# Patient Record
Sex: Female | Born: 1955
Health system: Southern US, Community
[De-identification: ages and names within clinical notes are randomized; demographics above are authoritative.]

## PROBLEM LIST (undated history)

## (undated) DIAGNOSIS — I10 Essential (primary) hypertension: Secondary | ICD-10-CM

## (undated) DIAGNOSIS — IMO0001 Reserved for inherently not codable concepts without codable children: Secondary | ICD-10-CM

## (undated) DIAGNOSIS — IMO0002 Reserved for concepts with insufficient information to code with codable children: Secondary | ICD-10-CM

## (undated) DIAGNOSIS — I00 Rheumatic fever without heart involvement: Secondary | ICD-10-CM

## (undated) DIAGNOSIS — R509 Fever, unspecified: Secondary | ICD-10-CM

## (undated) DIAGNOSIS — C50919 Malignant neoplasm of unspecified site of unspecified female breast: Secondary | ICD-10-CM

## (undated) DIAGNOSIS — I82409 Acute embolism and thrombosis of unspecified deep veins of unspecified lower extremity: Secondary | ICD-10-CM

## (undated) HISTORY — PX: PORT A CATH REVISION: SHX6033

## (undated) HISTORY — DX: Acute embolism and thrombosis of unspecified deep veins of unspecified lower extremity: I82.409

## (undated) HISTORY — PX: MASTECTOMY: SHX3

## (undated) HISTORY — PX: BREAST SURGERY: SHX581

## (undated) HISTORY — DX: Malignant neoplasm of unspecified site of unspecified female breast: C50.919

## (undated) HISTORY — DX: Rheumatic fever without heart involvement: I00

## (undated) HISTORY — DX: Essential (primary) hypertension: I10

---

## 1998-03-06 ENCOUNTER — Other Ambulatory Visit: Admission: RE | Admit: 1998-03-06 | Discharge: 1998-03-06 | Payer: Self-pay | Admitting: Internal Medicine

## 1998-10-25 ENCOUNTER — Emergency Department (HOSPITAL_COMMUNITY): Admission: EM | Admit: 1998-10-25 | Discharge: 1998-10-25 | Payer: Self-pay | Admitting: Emergency Medicine

## 1998-10-25 ENCOUNTER — Encounter: Payer: Self-pay | Admitting: Emergency Medicine

## 1999-02-09 ENCOUNTER — Other Ambulatory Visit: Admission: RE | Admit: 1999-02-09 | Discharge: 1999-02-09 | Payer: Self-pay | Admitting: Internal Medicine

## 2000-03-17 ENCOUNTER — Ambulatory Visit (HOSPITAL_COMMUNITY): Admission: RE | Admit: 2000-03-17 | Discharge: 2000-03-17 | Payer: Self-pay | Admitting: *Deleted

## 2001-02-22 ENCOUNTER — Emergency Department (HOSPITAL_COMMUNITY): Admission: EM | Admit: 2001-02-22 | Discharge: 2001-02-23 | Payer: Self-pay | Admitting: Emergency Medicine

## 2001-05-14 ENCOUNTER — Other Ambulatory Visit: Admission: RE | Admit: 2001-05-14 | Discharge: 2001-05-14 | Payer: Self-pay | Admitting: Family Medicine

## 2002-03-04 ENCOUNTER — Other Ambulatory Visit: Admission: RE | Admit: 2002-03-04 | Discharge: 2002-03-04 | Payer: Self-pay | Admitting: Radiology

## 2002-03-04 ENCOUNTER — Encounter (INDEPENDENT_AMBULATORY_CARE_PROVIDER_SITE_OTHER): Payer: Self-pay | Admitting: Specialist

## 2002-03-04 ENCOUNTER — Encounter: Payer: Self-pay | Admitting: Internal Medicine

## 2002-03-04 ENCOUNTER — Encounter: Admission: RE | Admit: 2002-03-04 | Discharge: 2002-03-04 | Payer: Self-pay | Admitting: Internal Medicine

## 2002-03-04 ENCOUNTER — Encounter (INDEPENDENT_AMBULATORY_CARE_PROVIDER_SITE_OTHER): Payer: Self-pay | Admitting: *Deleted

## 2002-03-25 ENCOUNTER — Encounter: Admission: RE | Admit: 2002-03-25 | Discharge: 2002-03-25 | Payer: Self-pay | Admitting: *Deleted

## 2002-03-25 ENCOUNTER — Encounter: Payer: Self-pay | Admitting: *Deleted

## 2002-03-27 ENCOUNTER — Encounter: Payer: Self-pay | Admitting: *Deleted

## 2002-03-27 ENCOUNTER — Ambulatory Visit (HOSPITAL_COMMUNITY): Admission: RE | Admit: 2002-03-27 | Discharge: 2002-03-27 | Payer: Self-pay | Admitting: *Deleted

## 2002-04-02 ENCOUNTER — Encounter: Admission: RE | Admit: 2002-04-02 | Discharge: 2002-04-02 | Payer: Self-pay | Admitting: General Surgery

## 2002-04-02 ENCOUNTER — Encounter: Payer: Self-pay | Admitting: General Surgery

## 2002-04-03 ENCOUNTER — Encounter (INDEPENDENT_AMBULATORY_CARE_PROVIDER_SITE_OTHER): Payer: Self-pay | Admitting: *Deleted

## 2002-04-03 ENCOUNTER — Other Ambulatory Visit: Admission: RE | Admit: 2002-04-03 | Discharge: 2002-04-03 | Payer: Self-pay | Admitting: General Surgery

## 2002-04-03 ENCOUNTER — Ambulatory Visit (HOSPITAL_BASED_OUTPATIENT_CLINIC_OR_DEPARTMENT_OTHER): Admission: RE | Admit: 2002-04-03 | Discharge: 2002-04-03 | Payer: Self-pay | Admitting: General Surgery

## 2002-04-29 ENCOUNTER — Ambulatory Visit (HOSPITAL_COMMUNITY): Admission: RE | Admit: 2002-04-29 | Discharge: 2002-04-29 | Payer: Self-pay | Admitting: *Deleted

## 2002-04-29 ENCOUNTER — Encounter: Payer: Self-pay | Admitting: *Deleted

## 2002-05-02 ENCOUNTER — Ambulatory Visit: Admission: RE | Admit: 2002-05-02 | Discharge: 2002-07-31 | Payer: Self-pay | Admitting: *Deleted

## 2002-05-27 ENCOUNTER — Encounter (INDEPENDENT_AMBULATORY_CARE_PROVIDER_SITE_OTHER): Payer: Self-pay | Admitting: *Deleted

## 2002-05-27 ENCOUNTER — Ambulatory Visit (HOSPITAL_BASED_OUTPATIENT_CLINIC_OR_DEPARTMENT_OTHER): Admission: RE | Admit: 2002-05-27 | Discharge: 2002-05-27 | Payer: Self-pay | Admitting: General Surgery

## 2002-07-19 ENCOUNTER — Encounter: Payer: Self-pay | Admitting: Emergency Medicine

## 2002-07-19 ENCOUNTER — Emergency Department (HOSPITAL_COMMUNITY): Admission: EM | Admit: 2002-07-19 | Discharge: 2002-07-19 | Payer: Self-pay | Admitting: Emergency Medicine

## 2002-08-01 ENCOUNTER — Ambulatory Visit: Admission: RE | Admit: 2002-08-01 | Discharge: 2002-08-15 | Payer: Self-pay | Admitting: *Deleted

## 2002-08-08 IMAGING — CT CT CHEST W/ CM
1 of 2 series · 15 of 32 positions shown, 19 images · IV contrast (GASTRO. & OMNIPAQUE [ID])
Comparison: none

FINDINGS
CLINICAL DATA: BREAST CARCINOMA.  CON NONE.
TECHNIQUE
MULTIDETECTOR HELICAL CT SCANNING OF THE CHEST, ABDOMEN, AND PELVIS WAS PERFORMED FOLLOWING
ADMINISTRATION OF ORAL CONTRAST AND FOOHH OF IV OMNIPAQUE 300.  DELAYED IMAGING THROUGH THE
KIDNEYS.  NO IMMEDIATE ADVERSE REACTION.  NO COMPARISON.
CT CHEST W/CONTRAST
HEART AND GREAT VESSELS GROSSLY UNREMARKABLE.  THERE IS A 2CM SOFT TISSUE ROUNDED DENSITY WITHIN
THE MEDIAL RIGHT BREAST WHICH MAY CORRESPOND WITH THE PATIENT'S KNOWN CARCINOMA.  THERE ARE MILDLY
ENLARGED RIGHT AXILLARY LYMPH NODES, LARGEST ON IMAGE NUMBER 13 MEASURING 2.3 X 1CM AND NEOPLASTIC
INVOLVEMENT NOT EXCLUDED.  NO EVIDENCE OF ENLARGED LYMPH NODES IN THE LEFT AXILLARY, MEDIASTINAL,
OR HILAR REGIONS.  NO PLEURAL OR PERICARDIAL EFFUSION.  LUNGS ARE CLEAR.  NO EVIDENCE OF PULMONARY
NODULES OR MASSES.
IMPRESSION
2CM MEDIAL RIGHT BREAST LESION WITH A FEW MILDLY ENLARGED RIGHT AXILLARY LYMPH NODES.  NEOPLASTIC
INVOLVEMENT NOT EXCLUDED.
CT ABDOMEN W/CONTRAST
THERE IS A 5MM LOW DENSITY LESION WITHIN THE INFERIOR LIVER ON IMAGE 57, NON SPECIFIC/TOO SMALL TO
CHARACTERIZE, BUT PROBABLY SMALL CYST OR HEMANGIOMA.  REMAINDER OF THE LIVER UNREMARKABLE.  SPLEEN,
PANCREAS, ADRENAL GLANDS, KIDNEYS, UNREMARKABLE.  NO FREE FLUID OR ENLARGED LYMPH NODES.  BOWEL
GROSSLY UNREMARKABLE.
NON SPECIFIC 5MM LOW DENSITY LESION IN THE LIVER ON IMAGE #57, OTHERWISE UNREMARKABLE CT ABDOMEN.
CT PELVIS W/CONTRAST
THERE IS A SUGGESTION OF TWO AREAS OF LOW ATTENUATION WITHIN THE UTERUS WHICH COULD REPRESENT A
SEPTATE UTERUS.  9.GPQ CYST/FOLLICLE WITHIN THE LEFT OVARY NOTED AS WELL AS A POSSIBLE 2.3 X
RIGHT ADNEXAL CYST VS. SMALL AMOUNT OF FREE FLUID.  NO DEFINITE SOLID MASSES.  NO ENLARGED LYMPH
NODES.  BLADDER GROSSLY UNREMARKABLE.
1.  BILATERAL ADNEXAL CYSTS/FOLLICLES.  ULTRASOUND MAY BE HELPFUL FOR FURTHER EVALUATION.
2.  SUGGESTION OF TWO SEPARATE LOW ATTENUATION ENDOMETRIAL CANALS WITHIN THE UTERUS, QUERY SEPTATE
UTERUS.  FURTHER IMAGING MAY BE HELPFUL AS CLINICALLY INDICATED (MRI).
3.  NO EVIDENCE OF METASTATIC DISEASE IN THE PELVIS.

[Series 2: chest/abd/pelvis · axial · 0.74mm/px · z∈[-498,-17]mm · 15 of 137 slices shown, 19 images]
[im 7/137  soft-tissue]
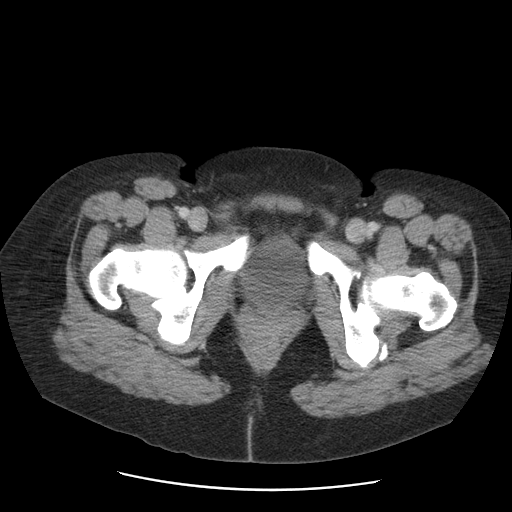
[im 7/137  bone]
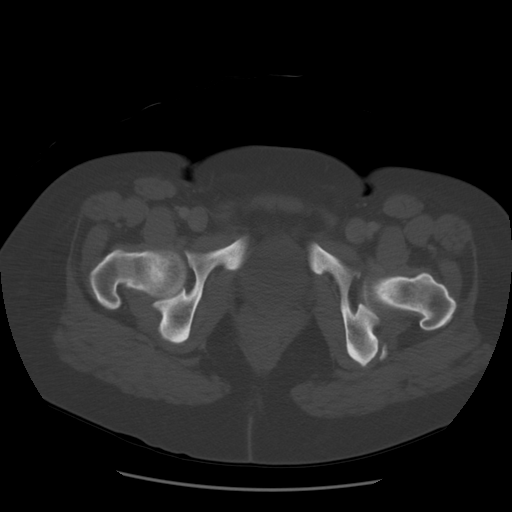
[im 19/137  soft-tissue]
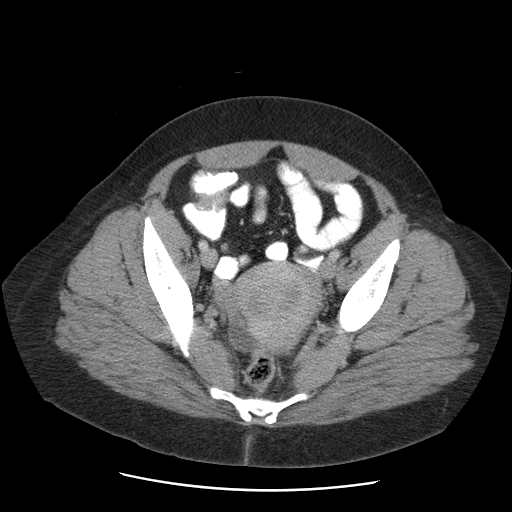
[im 31/137  soft-tissue]
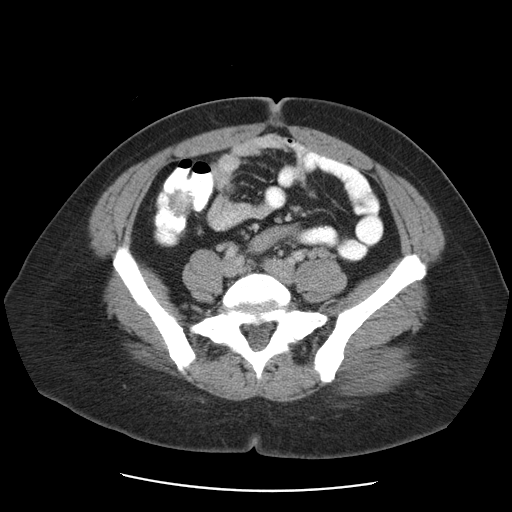
[im 38/137  soft-tissue]
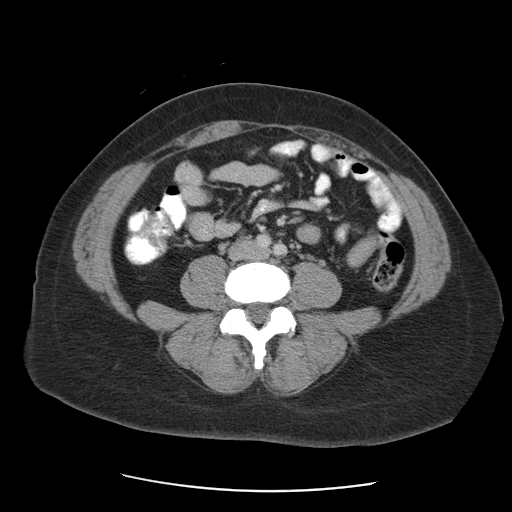
[im 50/137  soft-tissue]
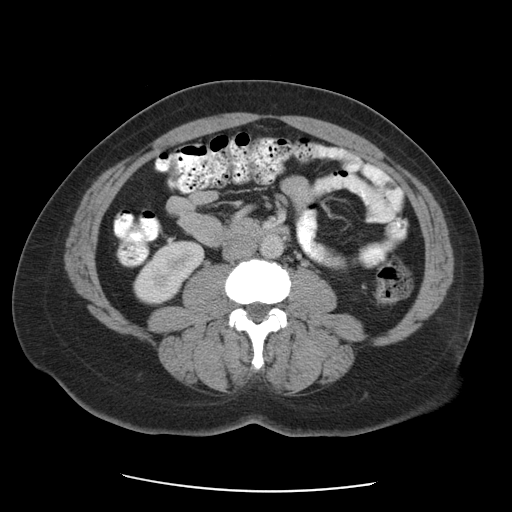
[im 56/137  soft-tissue]
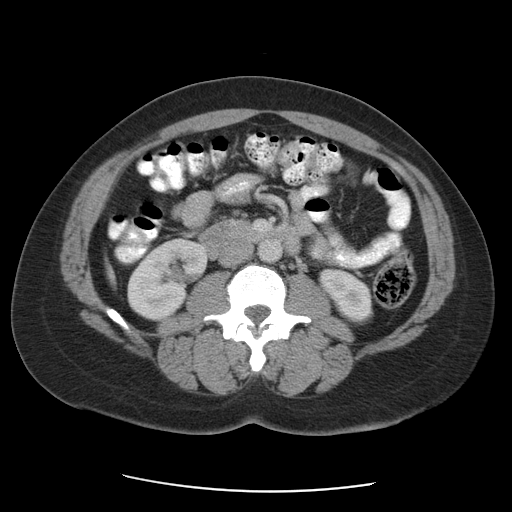
[im 69/137  soft-tissue]
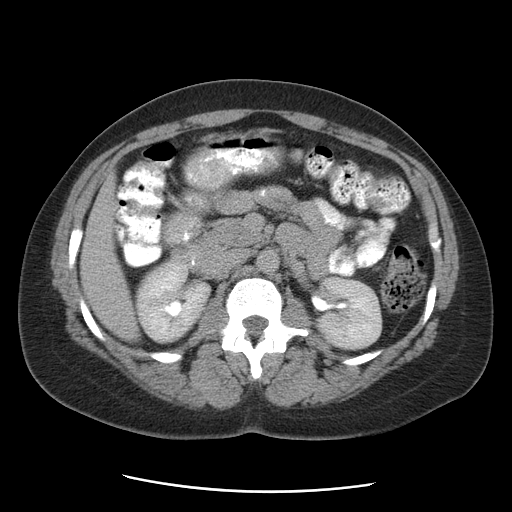
[im 81/137  soft-tissue]
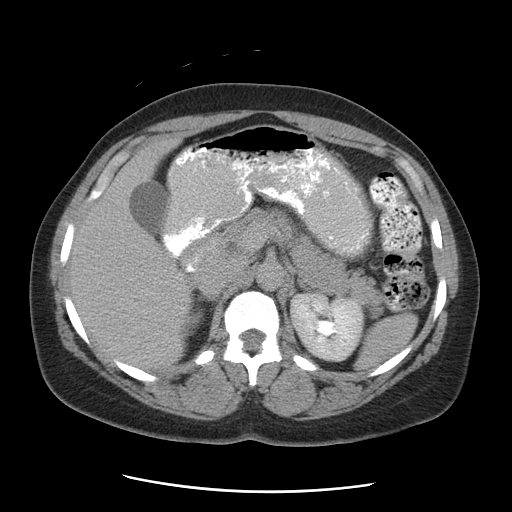
[im 87/137  soft-tissue]
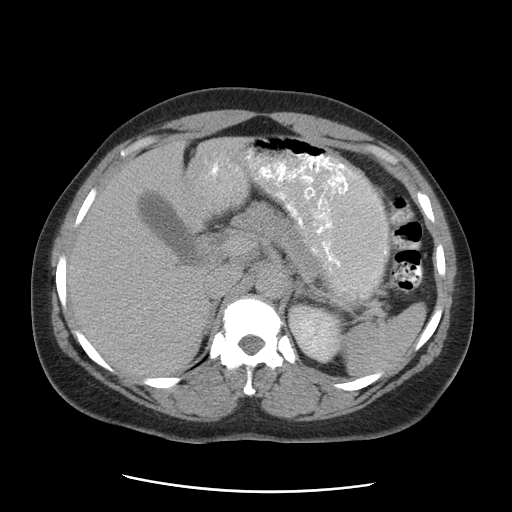
[im 87/137  bone]
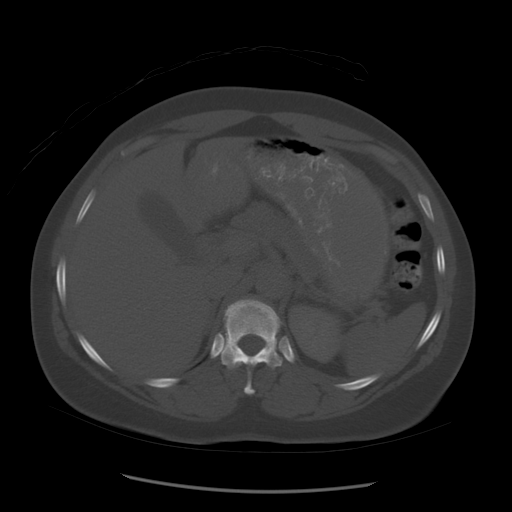
[im 99/137  soft-tissue]
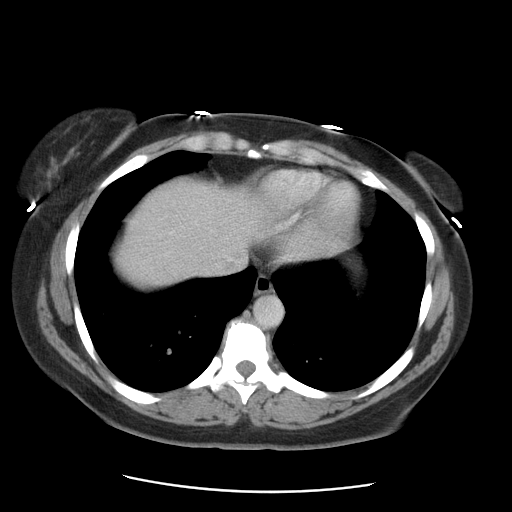
[im 106/137  soft-tissue]
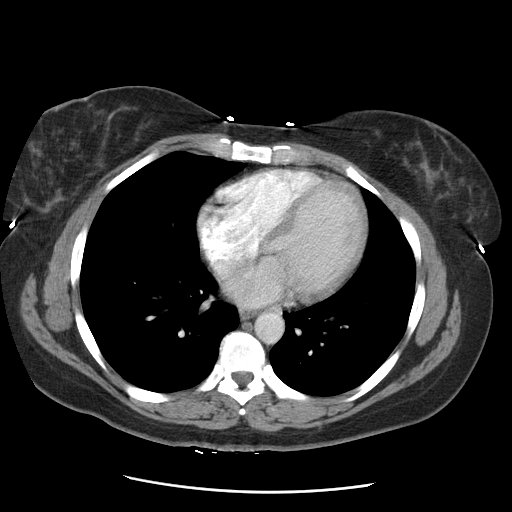
[im 112/137  lung]
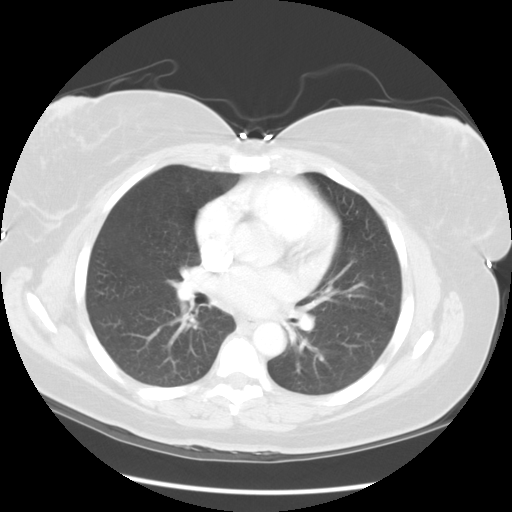
[im 118/137  soft-tissue]
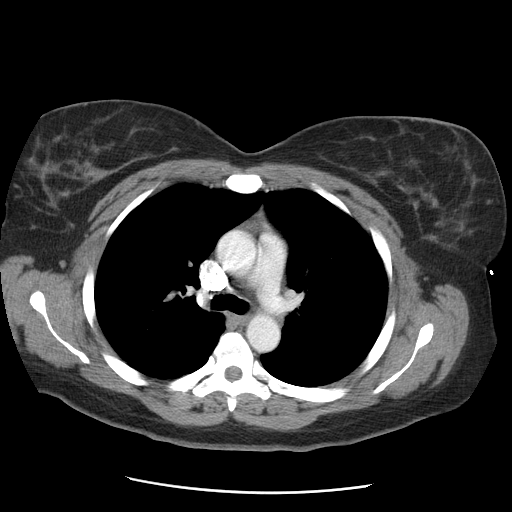
[im 118/137  lung]
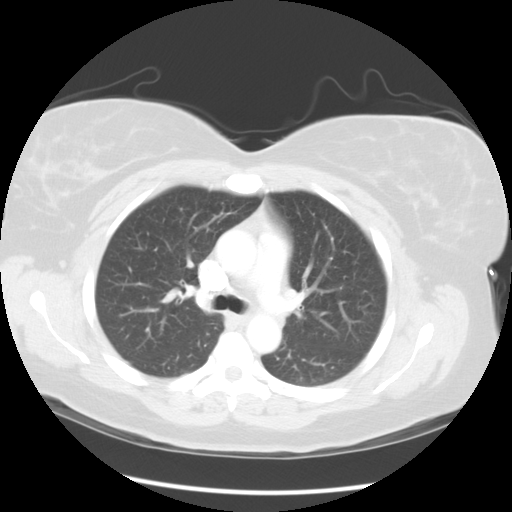
[im 124/137  lung]
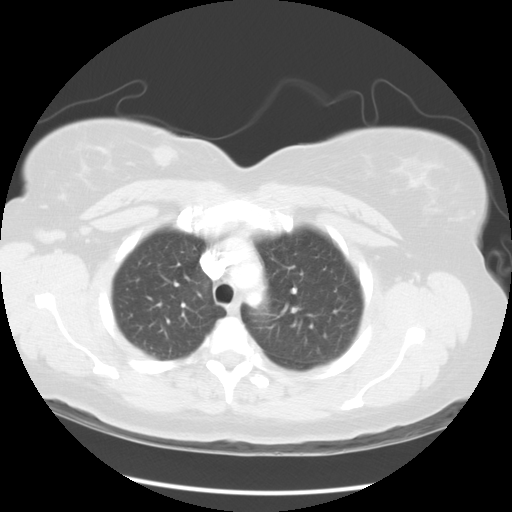
[im 130/137  soft-tissue]
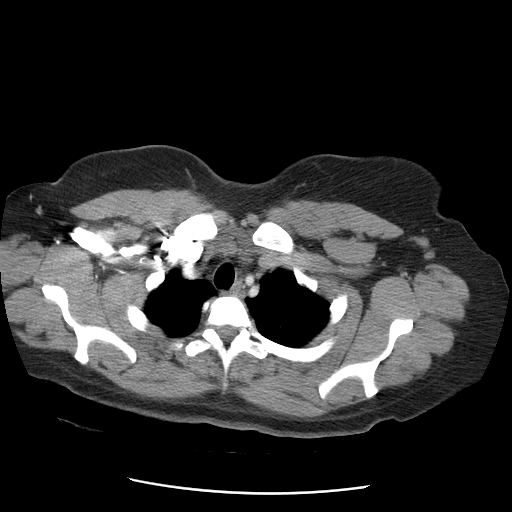
[im 130/137  lung]
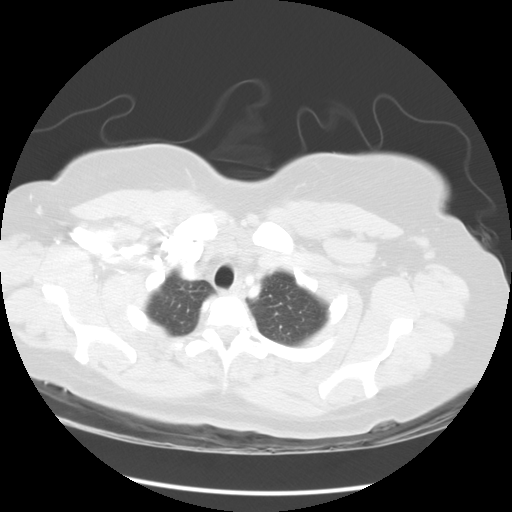

[15 of 32 positions shown; findings below may reference images not displayed]

## 2002-11-11 ENCOUNTER — Encounter: Admission: RE | Admit: 2002-11-11 | Discharge: 2002-11-11 | Payer: Self-pay | Admitting: *Deleted

## 2002-11-11 ENCOUNTER — Encounter: Payer: Self-pay | Admitting: *Deleted

## 2003-11-13 ENCOUNTER — Encounter: Admission: RE | Admit: 2003-11-13 | Discharge: 2003-11-13 | Payer: Self-pay | Admitting: General Surgery

## 2004-01-14 ENCOUNTER — Emergency Department (HOSPITAL_COMMUNITY): Admission: EM | Admit: 2004-01-14 | Discharge: 2004-01-14 | Payer: Self-pay | Admitting: Family Medicine

## 2004-02-20 ENCOUNTER — Encounter (HOSPITAL_COMMUNITY): Admission: RE | Admit: 2004-02-20 | Discharge: 2004-05-20 | Payer: Self-pay | Admitting: Oncology

## 2004-02-24 ENCOUNTER — Encounter: Admission: RE | Admit: 2004-02-24 | Discharge: 2004-02-24 | Payer: Self-pay | Admitting: Oncology

## 2004-02-24 ENCOUNTER — Encounter (INDEPENDENT_AMBULATORY_CARE_PROVIDER_SITE_OTHER): Payer: Self-pay | Admitting: Specialist

## 2004-03-03 ENCOUNTER — Encounter: Admission: RE | Admit: 2004-03-03 | Discharge: 2004-03-03 | Payer: Self-pay | Admitting: Oncology

## 2004-03-08 ENCOUNTER — Ambulatory Visit (HOSPITAL_BASED_OUTPATIENT_CLINIC_OR_DEPARTMENT_OTHER): Admission: RE | Admit: 2004-03-08 | Discharge: 2004-03-08 | Payer: Self-pay | Admitting: General Surgery

## 2004-03-09 ENCOUNTER — Ambulatory Visit (HOSPITAL_COMMUNITY): Admission: RE | Admit: 2004-03-09 | Discharge: 2004-03-09 | Payer: Self-pay | Admitting: General Surgery

## 2004-03-11 ENCOUNTER — Encounter: Admission: RE | Admit: 2004-03-11 | Discharge: 2004-03-11 | Payer: Self-pay | Admitting: General Surgery

## 2004-03-15 ENCOUNTER — Emergency Department (HOSPITAL_COMMUNITY): Admission: EM | Admit: 2004-03-15 | Discharge: 2004-03-15 | Payer: Self-pay | Admitting: Emergency Medicine

## 2004-03-23 ENCOUNTER — Encounter (HOSPITAL_COMMUNITY): Admission: RE | Admit: 2004-03-23 | Discharge: 2004-06-03 | Payer: Self-pay | Admitting: Oncology

## 2004-05-31 ENCOUNTER — Ambulatory Visit (HOSPITAL_COMMUNITY): Admission: RE | Admit: 2004-05-31 | Discharge: 2004-05-31 | Payer: Self-pay | Admitting: Oncology

## 2004-06-15 ENCOUNTER — Encounter: Admission: RE | Admit: 2004-06-15 | Discharge: 2004-06-15 | Payer: Self-pay | Admitting: Oncology

## 2004-07-05 IMAGING — MR MR BREAST*R* WO/W CM
6 of 7 series · 20 of 48 positions shown · IV contrast (omniscan)
Comparison: none

FINDINGS
MR BILATERAL BREASTS WITH/WITHOUT CONTRAST
HISTORY: History of right breast cancer in February 2002.  The patient has ?hardening in the right breast?.  
Sagittal images are performed of  both breasts prior to and following the administration of 15 cc of Omniscan. 
The right breast is diffusely abnormal with marked diffuse skin thickening.   There are innumerable enhancing lesions throughout the entire right breast, specifically confluent in the subareolar and upper portions of the breasts.   The findings suggest diffuse recurrence of breast cancer throughout the breast.   Enhancing skin nodules are also identified, suggesting skin metastases.   Nodules extend posteriorly and appear to involve superficial portion of the pectoralis muscle.   Surgical change is seen in the upper inner quadrant of the right breast. 
The left breast shows no specific evidence for malignancy.   
IMPRESSION
Marked abnormality of the right breast, suggesting recurrence of breast cancer throughout the entire breast.   I suspect there are skin metastases as well as possible pectoralis major involvement.   The findings were discussed with Dr. Jim.  The patient will be recalled for ultrasound evaluation. 
Assessment:
BIRADS 5:  Highly suggestive of malignancy; biopsy is recommended.

[Series 1: 3-plane local · axial · 5.0mm · 1.56mm/px · z∈[-30,+199]mm · 2 of 33 slices shown]
[im 1/33]
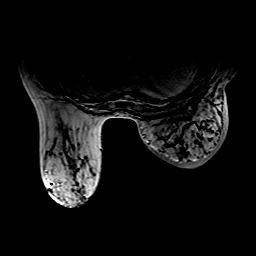
[im 33/33]
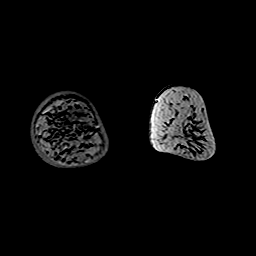

[Series 3: T2 · sagittal · 3.5mm · 0.39mm/px · 1 of 40 slices shown]
[im 1/40]
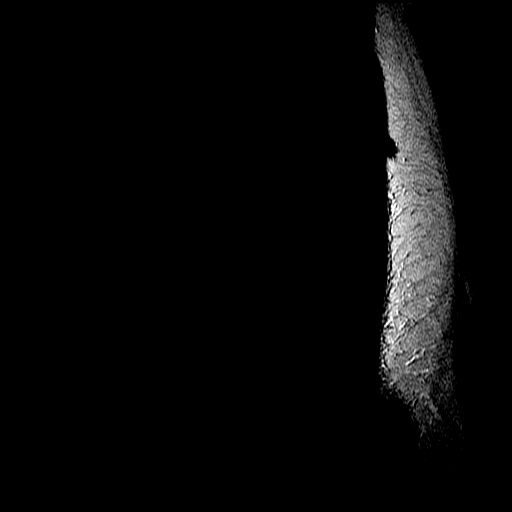

[Series 4: sag in/out phase · sagittal · 3.5mm · 0.39mm/px · 2 of 80 slices shown]
[im 1/80]
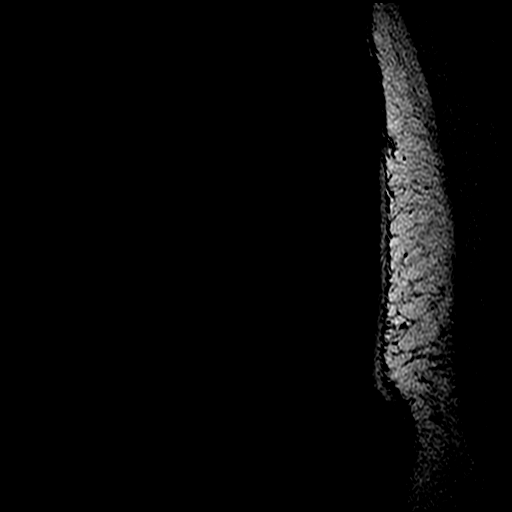
[im 80/80]
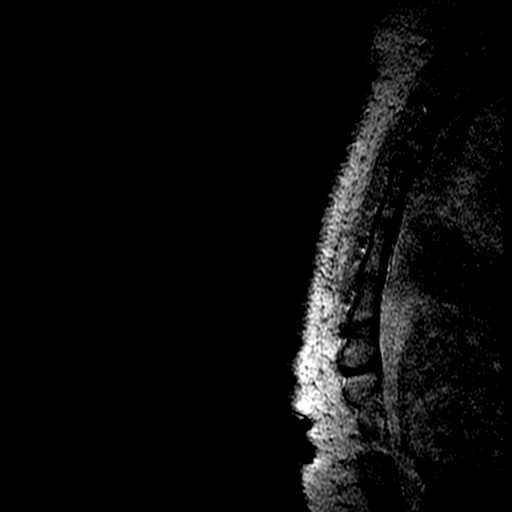

[Series 5: vibrant - large · sagittal · 2.8mm · 0.43mm/px · 3 of 112 slices shown]
[im 1/112]
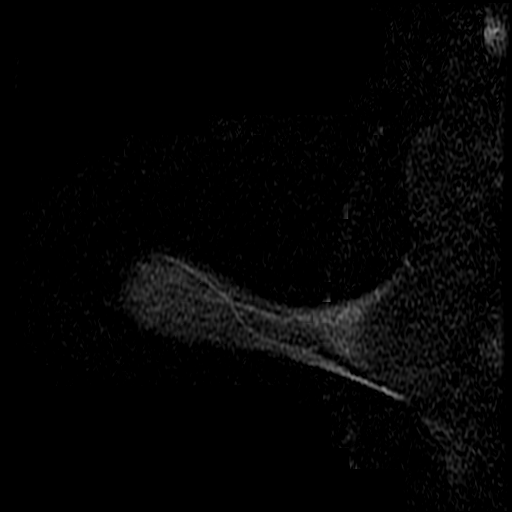
[im 56/112]
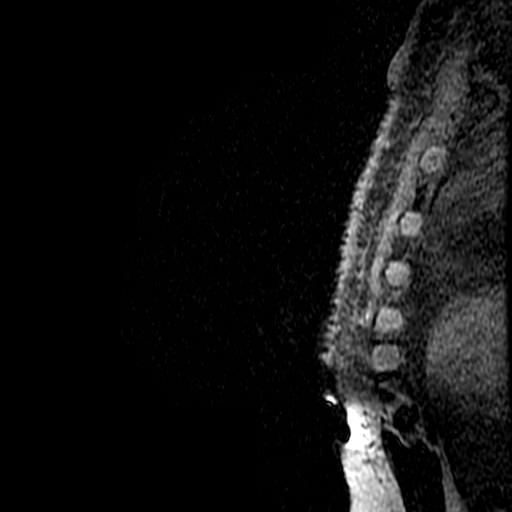
[im 112/112]
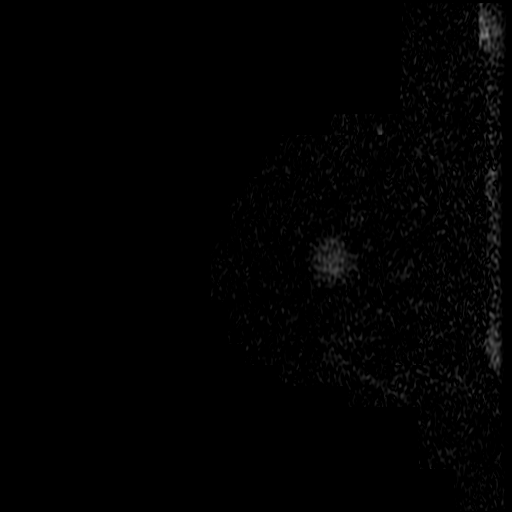

[Series 6: vibrant -15cc · sagittal · 2.8mm · 0.43mm/px · 8 of 672 slices shown]
[im 34/672]
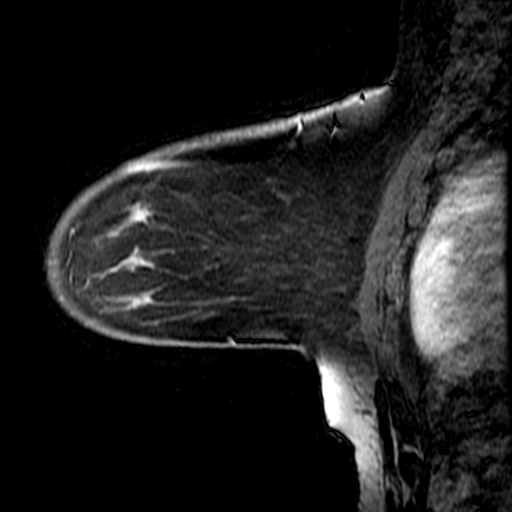
[im 101/672]
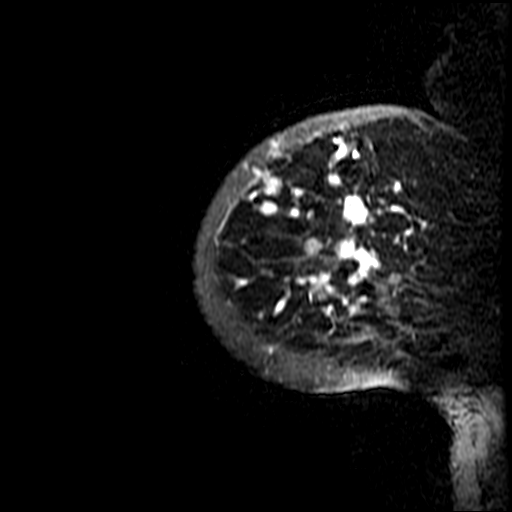
[im 202/672]
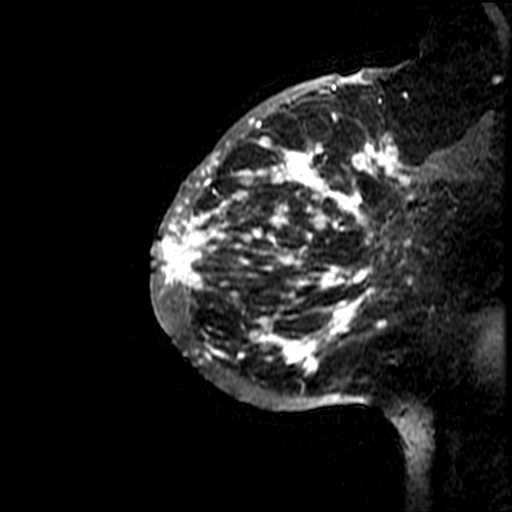
[im 302/672]
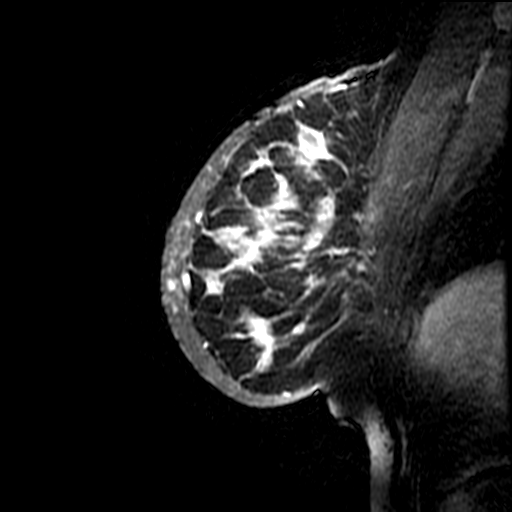
[im 370/672]
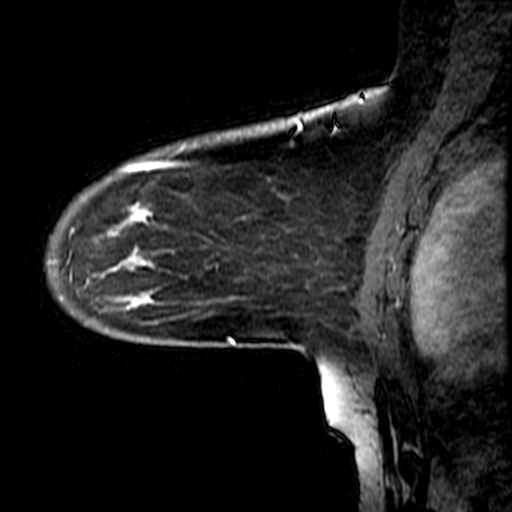
[im 470/672]
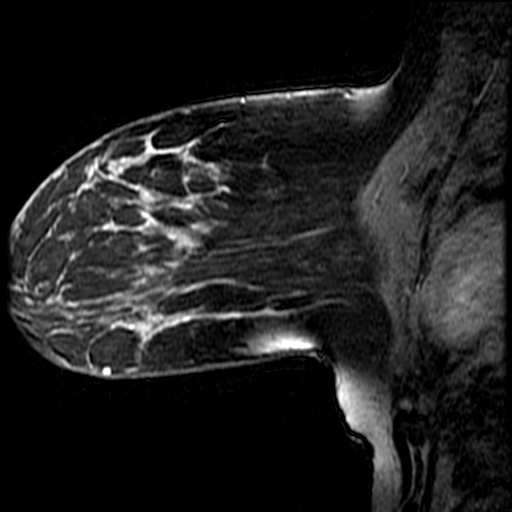
[im 571/672]
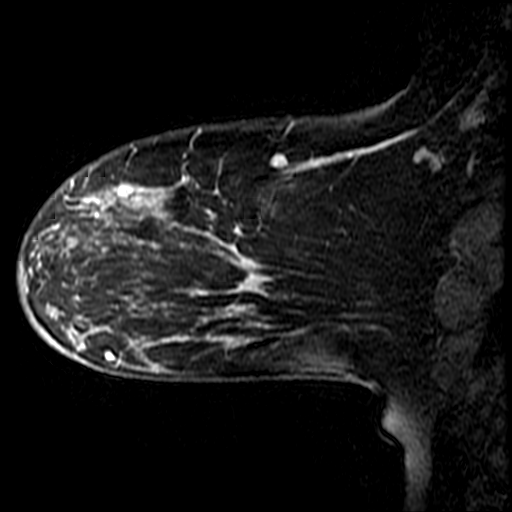
[im 638/672]
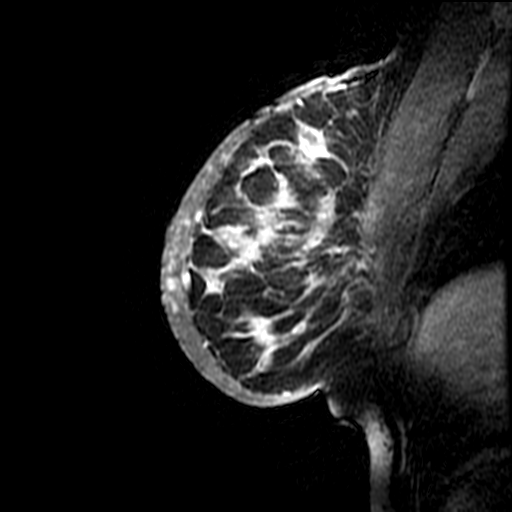

[Series 600: ((date))-((date)) · sagittal · 2.8mm · 0.43mm/px · 4 of 592 slices shown]
[im 35/592]
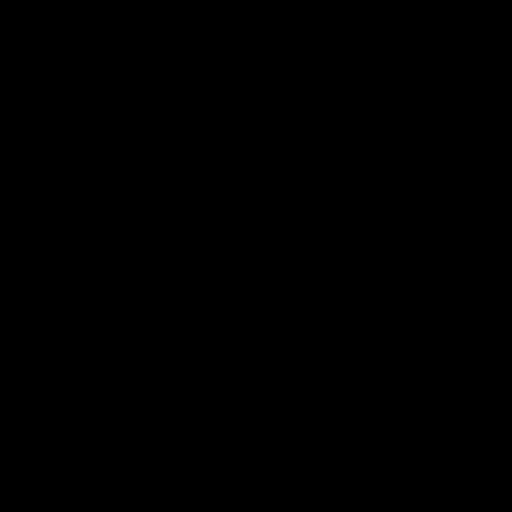
[im 105/592]
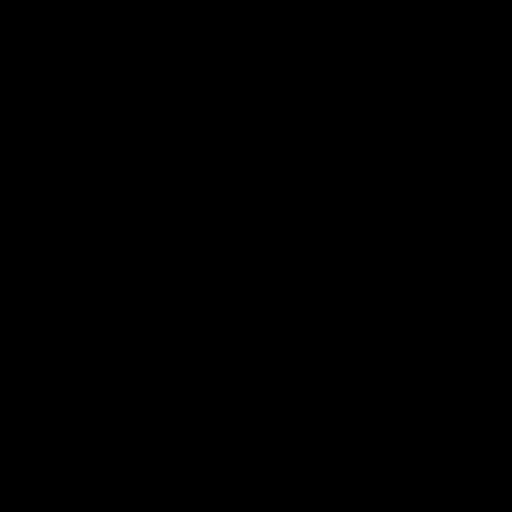
[im 174/592]
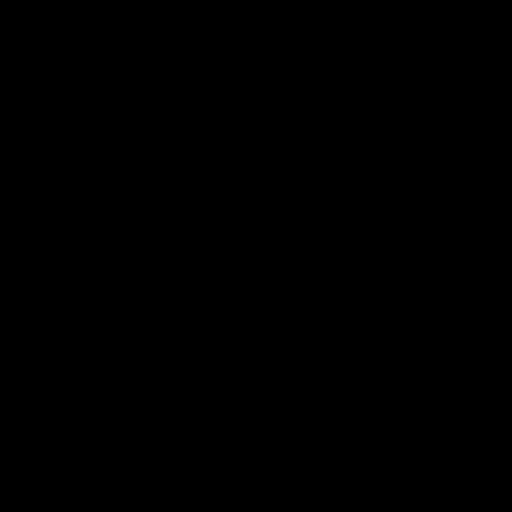
[im 244/592]
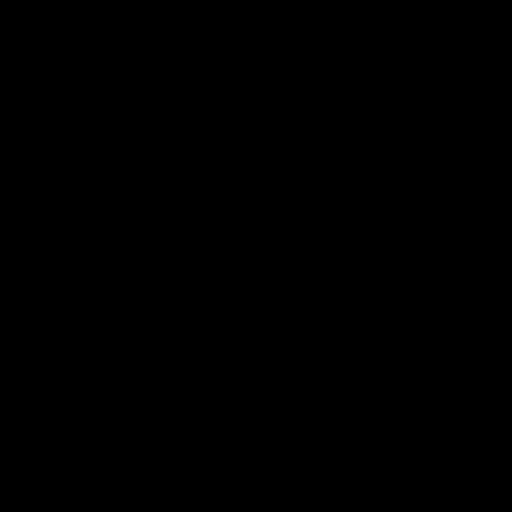

[20 of 48 positions shown; findings below may reference images not displayed]

## 2004-07-13 ENCOUNTER — Encounter (INDEPENDENT_AMBULATORY_CARE_PROVIDER_SITE_OTHER): Payer: Self-pay | Admitting: *Deleted

## 2004-07-13 ENCOUNTER — Inpatient Hospital Stay (HOSPITAL_COMMUNITY): Admission: RE | Admit: 2004-07-13 | Discharge: 2004-07-14 | Payer: Self-pay | Admitting: General Surgery

## 2004-07-17 IMAGING — CT CT ABDOMEN W/ CM
1 of 2 series · 14 of 32 positions shown, 18 images · IV contrast (gastro & omnipaque 100)
Comparison: none

CLINICAL DATA: Relapse of breast carcinoma. 
 CT CHEST W/CONTRAST: 
 Multidetector helical scans through the chest were performed after IV contrast media was given.  733cc of Omnipaque 300 were given as the contrast media.  This scan is compared to the prior CT of 03/25/02.  
 There is amorphus soft tissue within the medial right breast at the site of the prior mass on CT most likely due to scarring from surgical resection.  There is thickening of the skin of the right breast diffusely apparently due to radiatio treatment.  There is more nodularity throughout the entire right breast tissue of uncertain significance and correlation with mammography is recommended.  Some scarring is noted in the right axilla.  No mediastinal or hilar adenopathy is seen.  The pulmonary arteries and thoracic aorta opacify normally.  On lung window images no lung parenchymal lesion is seen.  Minimal scarring is noted in the anterior right mid lung.

[Series 2: chest/abd/pelvis · axial · 0.70mm/px · z∈[-490,-15]mm · 14 of 136 slices shown, 18 images]
[im 7/136  soft-tissue]
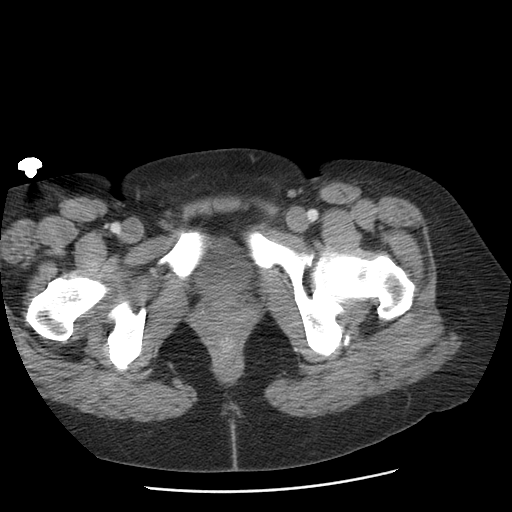
[im 7/136  bone]
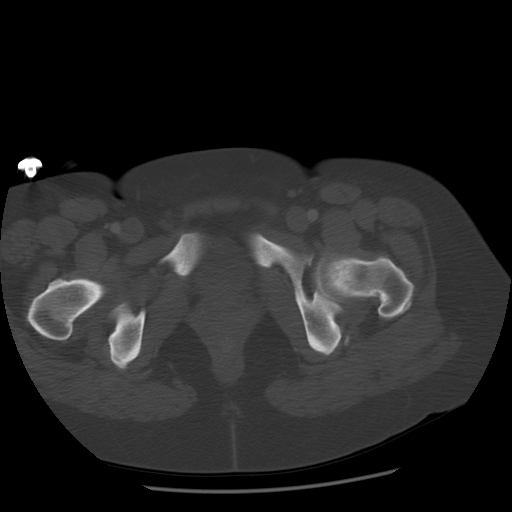
[im 20/136  soft-tissue]
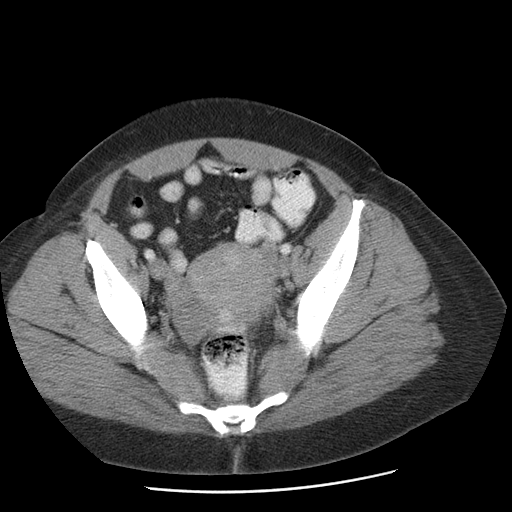
[im 33/136  soft-tissue]
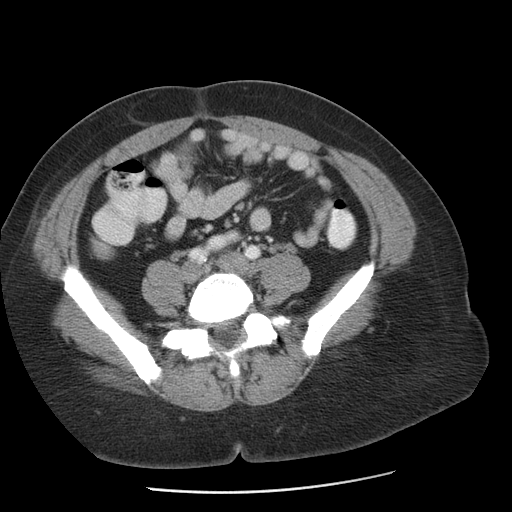
[im 39/136  soft-tissue]
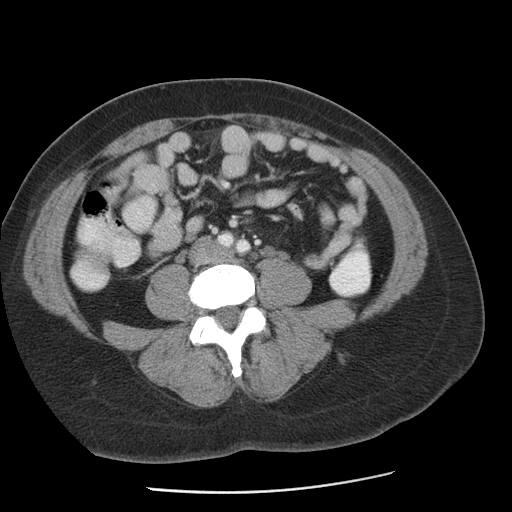
[im 52/136  soft-tissue]
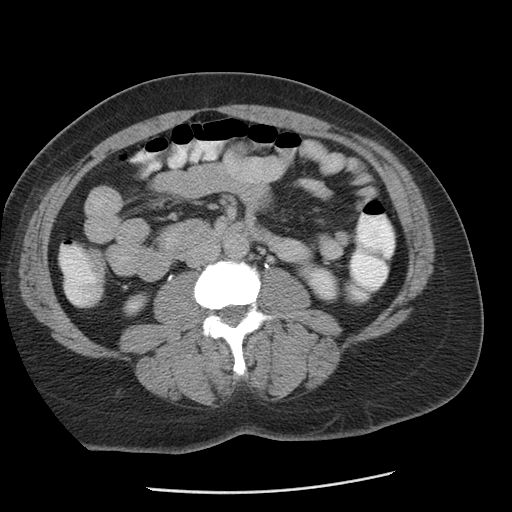
[im 65/136  soft-tissue]
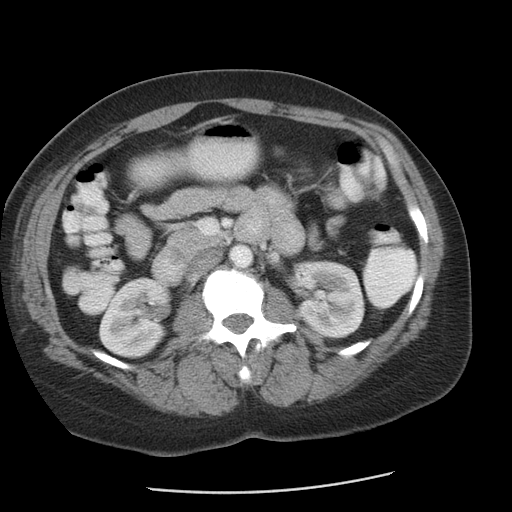
[im 71/136  soft-tissue]
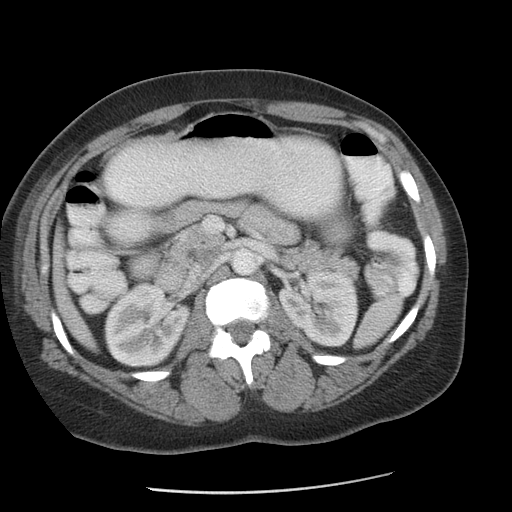
[im 84/136  soft-tissue]
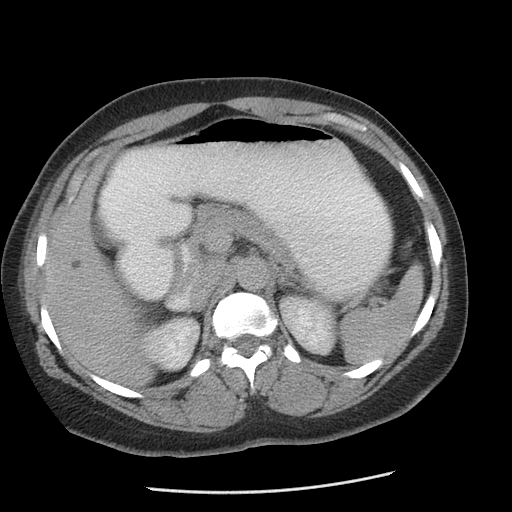
[im 97/136  soft-tissue]
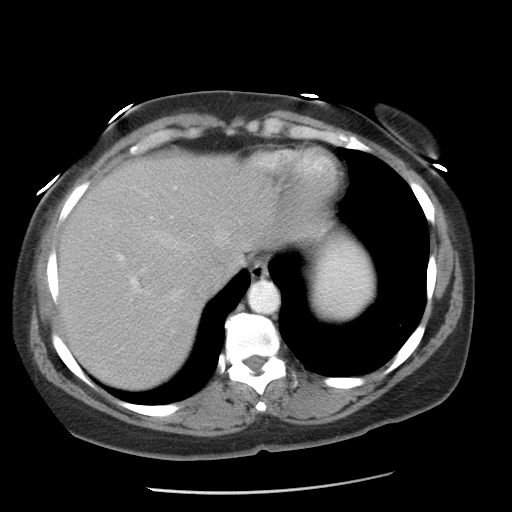
[im 97/136  bone]
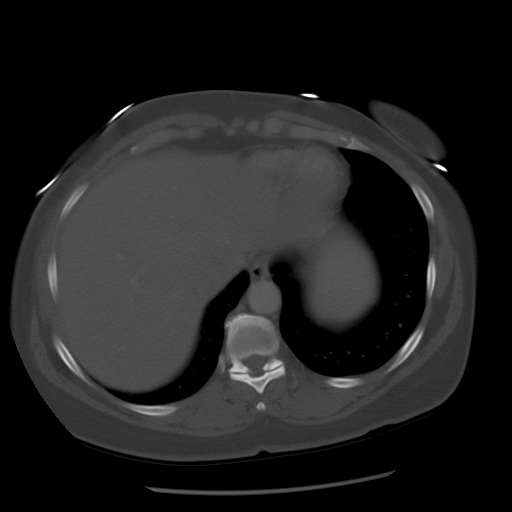
[im 103/136  soft-tissue]
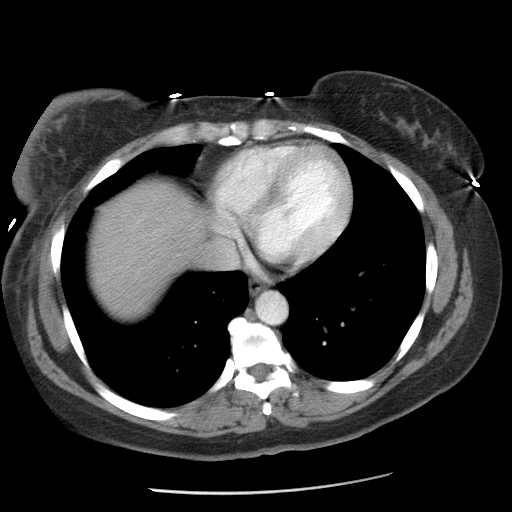
[im 110/136  lung]
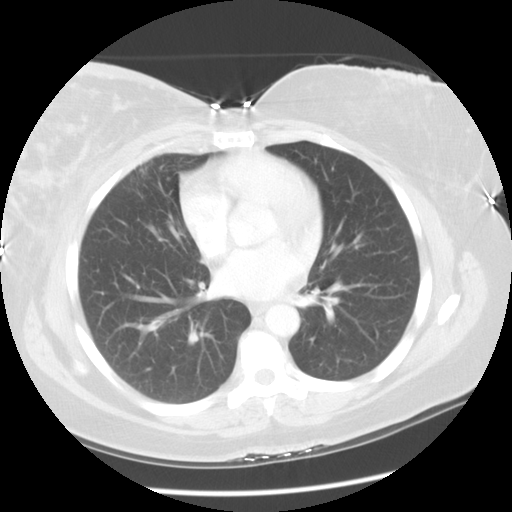
[im 116/136  soft-tissue]
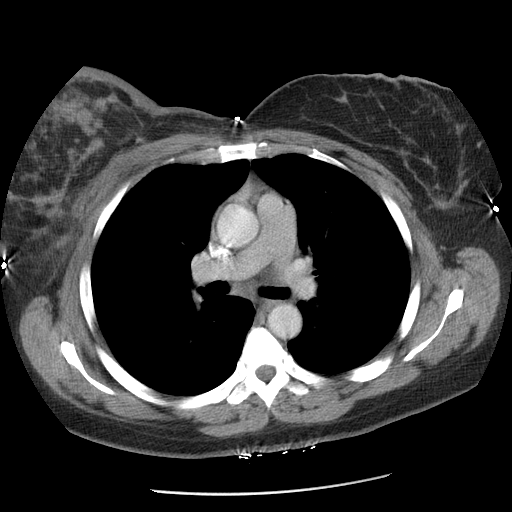
[im 116/136  lung]
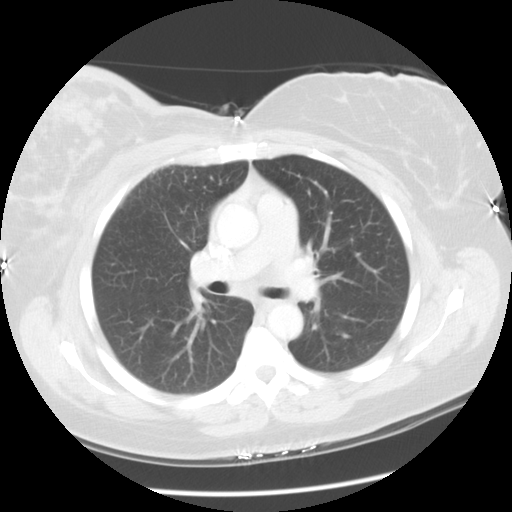
[im 123/136  lung]
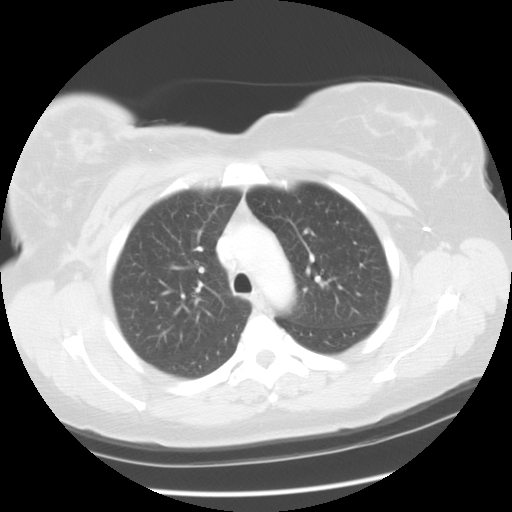
[im 129/136  soft-tissue]
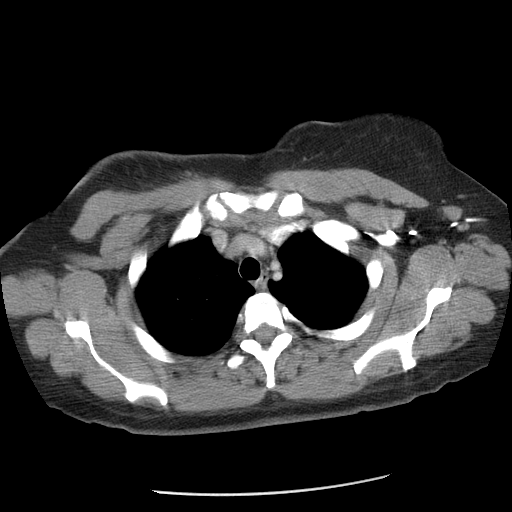
[im 129/136  lung]
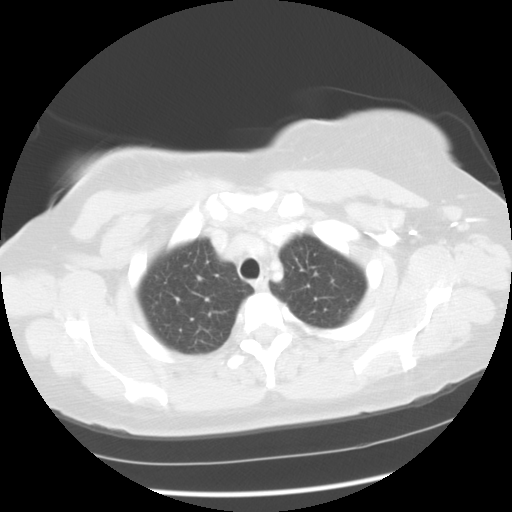

[14 of 32 positions shown; findings below may reference images not displayed]

IMPRESSION: 1.  Probable scarring in right breast with diffuse skin thickening in the right breast apparently due to radiation treatment.  However there is nodularity throughout the right breast which is new and recurrence of carcinoma cannot be excluded.  Correlation with mammography is recommended.  
 2.  No mediastinal or hilar adenopathy is seen. 
 CT ABDOMEN W/CONTRAST: 
 The liver enhances normally.  The previously described small sub-cm low attenuation structure in the caudal aspect of the right lobe of liver is again noted and of doubtful significance.  No new hepatic lesion is seen.  No calcified gallstones are noted.  The pancreas is normal in size as are the adrenal glands and the spleen.  The kidneys enhance normally and on delayed images the pelvocaliceal systems are normal.  No adenopathy.  The abdominal aorta is normal in caliber.
IMPRESSION: Stable CT of the abdomen.  No metastatic involvement.  
 CT PELVIS W/CONTRAST: 
 Scans were continued through the pelvis after oral and IV contrast media were given and compared to the study of 03/25/02 as well.  The uterus is unchanged in size with low attenuation centrally.  Low attenuation areas in the adnexa are most consistent with small follicles.  No significant fluid is seen within the pelvis and no adenopathy is noted.  The appendix appears normal.
IMPRESSION: Stable CT of the pelvis.  No metastatic involvement.  Multiple small ovarian follicles present bilaterally.

## 2004-07-22 IMAGING — CR DG CHEST 1V PORT
1 series · 1 of 1 positions shown · non-contrast
Comparison: none

CLINICAL DATA: Status post port-a-cath insertion.
 CHEST - PORTABLE 1 VIEW ? 03/08/04 
 AP view of the chest made with the portable apparatus on 08 March, 2004 is compared to a CT of the chest done on 03/03/04.
 A port-a-cath has been inserted from the left subclavian with the tip noted to be in the superior vena cava just cephalad to the level of the carina.  There is no evidence of pneumothorax noted.  
 IMPRESSION
 Port-a-cath thought to be in satisfactory position without pneumothorax or other acute change.

[view not recorded]
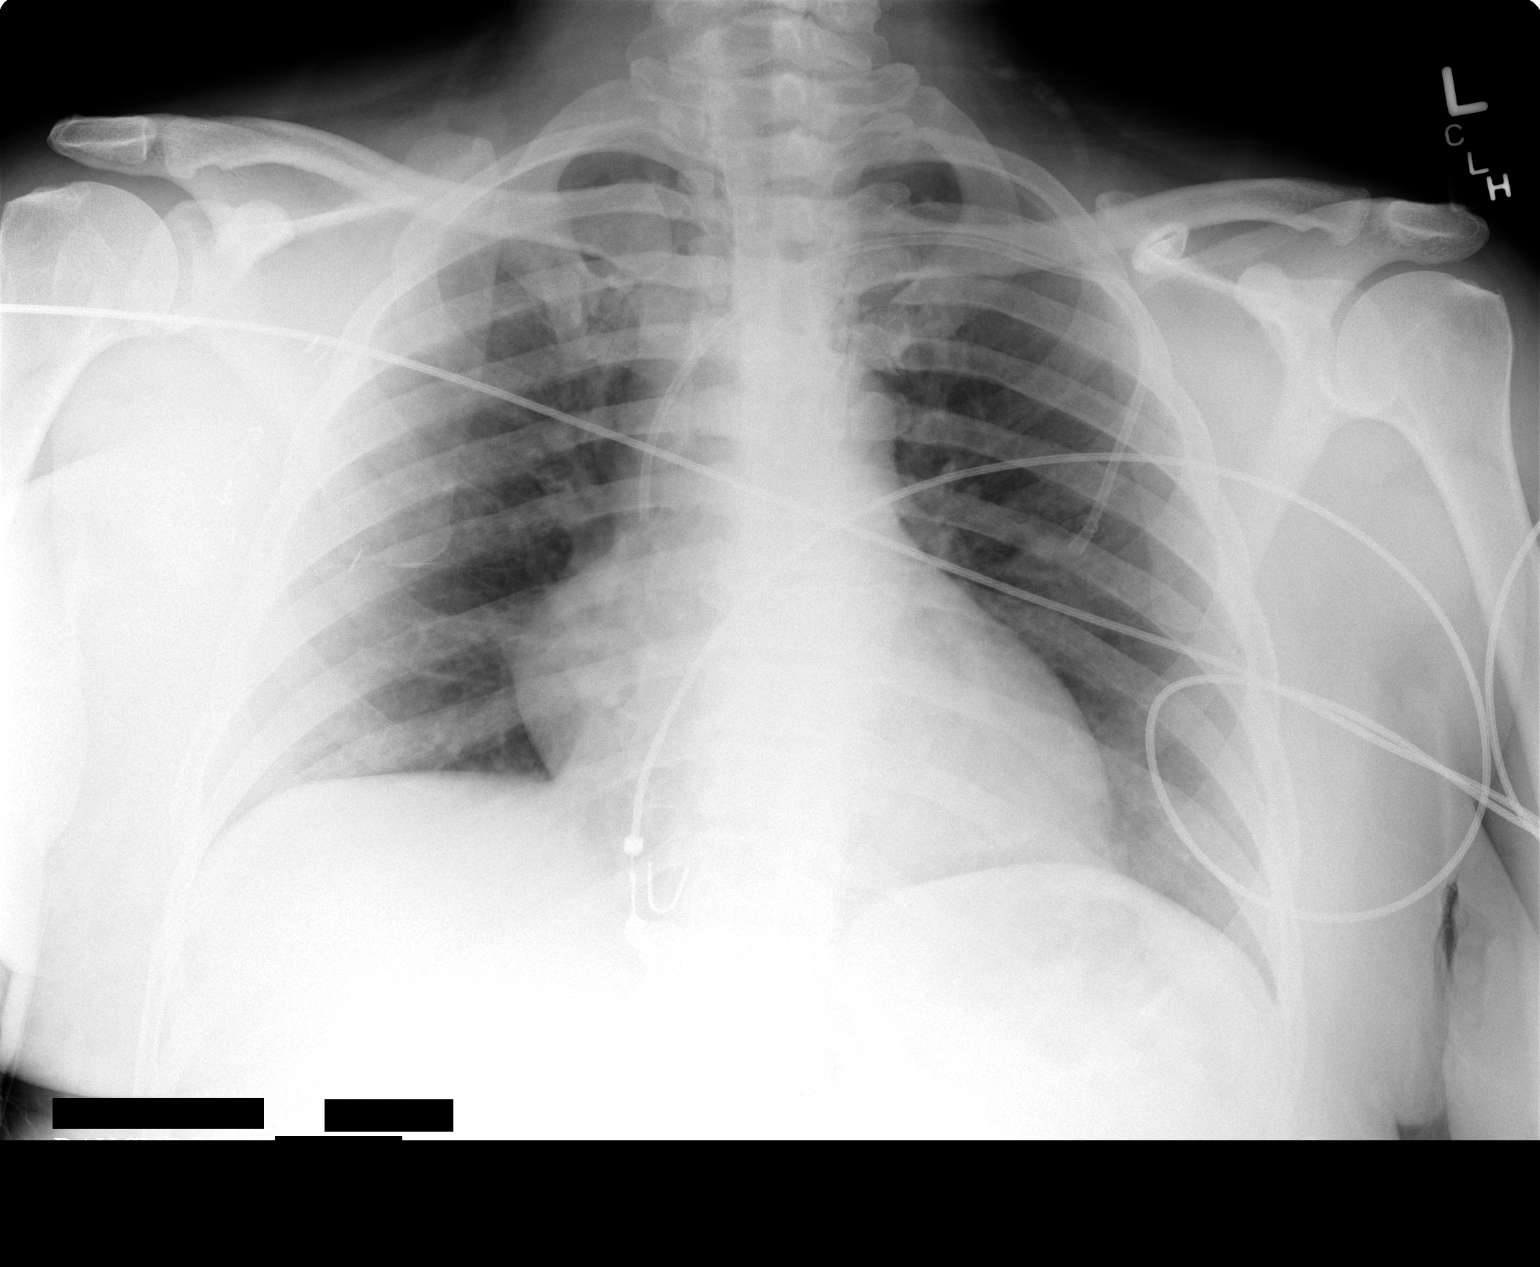

[1 of 1 positions shown; findings below may reference images not displayed]

## 2004-07-23 IMAGING — CT NM MULTISYS EXAM
1 of 3 series · 1 of 25 positions shown · non-contrast
Comparison: none

CLINICAL DATA: History of right breast cancer. 

 NUCLEAR MEDICINE STAGING/RESTAGING BREAST CANCER
 Fasting Blood Glucose:  102.
TECHNIQUE: 16.1 mCi F-18 FDG were administered via left antecubital.  Full ring PET imaging was performed from the skull base through the mid-thighs 60 minutes after injection.  CT data was obtained and used for attenuation correction and anatomic localization only.  (This was not acquired as a diagnostic CT examination.)
 The study is correlated with a recent CT scan of the chest, abdomen and pelvis.

[Series 1: pet ac · axial · 3.3mm · 4.69mm/px · 1 of 223 slices shown]
[im 112/223]
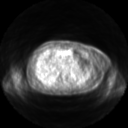

[1 of 25 positions shown; findings below may reference images not displayed]

FINDINGS: There is marked diffuse abnormal uptake in the right breast.  This corresponds to the CT findings of a markedly thickened appearance of the skin of the breast with marked nodularity and inflammatory-type change.  There are also at least three and possibly four lymph nodes in the right axilla and supraclavicular areas that show increased FDG activity.  It is possible that this could be due to a severe inflammatory process such as severe mastitis but I certainly worry about the possibility of an inflammatory breast carcinoma with positive lymph nodes.  I do not see any other areas of abnormal FDG activity in the neck, chest, abdomen, or pelvis.  There is prominence of both collecting systems.
 No abnormal bone activity is seen to suggest metastatic bone disease. 

 IMPRESSION
 Marked inflammatory change involving the right breast.  There is diffuse abnormal FDG uptake in the breast and in the skin along with at least three lymph nodes in the right axilla and supraclavicular area.  Although these findings could be due to severe inflammatory mastitis, I think it is more likely that it is due to an inflammatory breast carcinoma with positive lymph nodes. 
 No evidence for metastatic disease involving the neck, chest, abdomen, or pelvis.

## 2004-07-25 IMAGING — NM NM BONE WHOLE BODY
2 series · 2 of 2 positions shown · non-contrast
Comparison: none

CLINICAL DATA: History of breast carcinoma. 
 BONE SCAN
 The patient was injected with 26.0 mCi of 9cHHm MDP intravenously, and whole body bone scanning was performed. This scan is compared to a prior bone scan of 03/25/02.  There is increased activity diffusely throughout the right breast.  However, no abnormal bony activity is seen. The kidneys excrete the radionuclide. 
 IMPRESSION
 No bone metastasis.  Increased activity diffusely throughout the right breast.

[wb whole body · 1.17mm/px · 1 of 1 slices shown (1 of 2)]
[im 1/1]
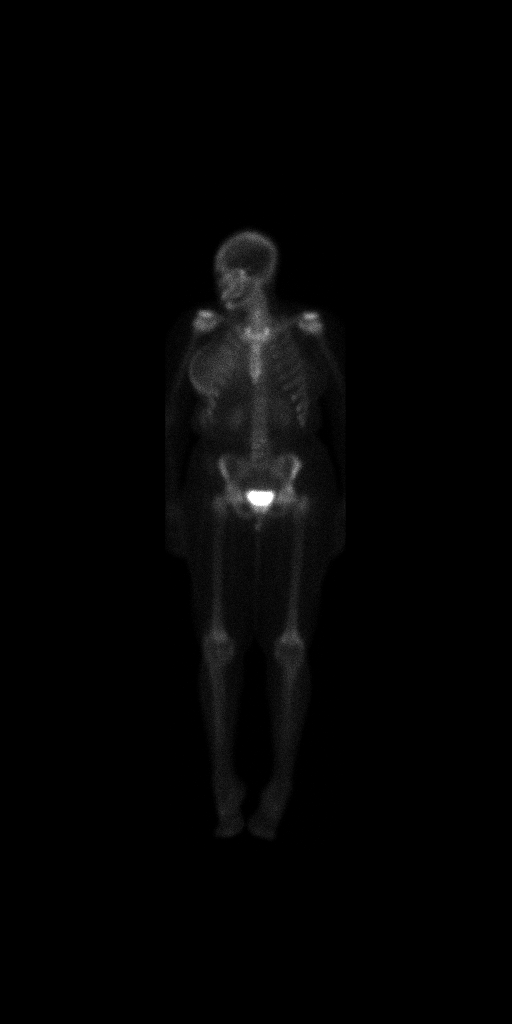

[wb whole body · 1.18mm/px · 1 of 1 slices shown (2 of 2)]
[im 1/1]
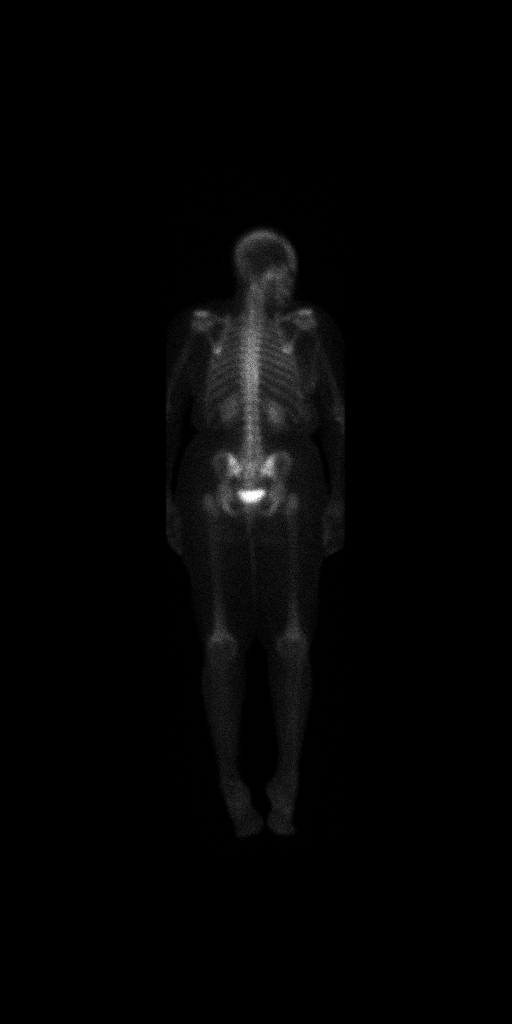

[2 of 2 positions shown; findings below may reference images not displayed]

## 2004-07-30 ENCOUNTER — Ambulatory Visit (HOSPITAL_COMMUNITY): Admission: RE | Admit: 2004-07-30 | Discharge: 2004-07-30 | Payer: Self-pay | Admitting: Oncology

## 2004-08-05 ENCOUNTER — Emergency Department (HOSPITAL_COMMUNITY): Admission: EM | Admit: 2004-08-05 | Discharge: 2004-08-06 | Payer: Self-pay | Admitting: Emergency Medicine

## 2004-08-08 IMAGING — NM NM CARDIA MUGA REST
2 series · 12 of 12 positions shown · non-contrast
Comparison: none

CLINICAL DATA: Breast cancer.
 NUCLEAR MEDICINE RESTING CARDIAC MUGA EXAMINATION 
 Nuclear medicine resting cardiac MUGA examination was performed after the patient was injected with a total of 26 millicuries of Ultratagged RBCs.  Anterior and LAO imaging over the heart demonstrates normal left ventricular wall motion.  No akinetic or dyskinetic segments.  The patient?s estimated ejection fraction was 59 percent.
 IMPRESSION
 1.  Normal left ventricular wall motion.
 2.  Estimated ejection fraction of 59 percent.

[Series 1: ga gated · 4.30mm/px · 6 of 16 frames shown (1 of 2)]
[frame 2/16]
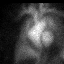
[frame 4/16]
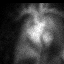
[frame 7/16]
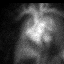
[frame 10/16]
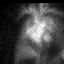
[frame 12/16]
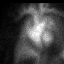
[frame 15/16]
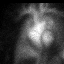

[Series 1: ga gated · 4.30mm/px · 6 of 16 frames shown (2 of 2)]
[frame 2/16]
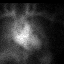
[frame 4/16]
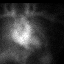
[frame 7/16]
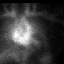
[frame 10/16]
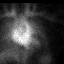
[frame 12/16]
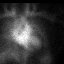
[frame 15/16]
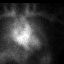

[12 of 12 positions shown; findings below may reference images not displayed]

## 2004-08-25 ENCOUNTER — Emergency Department (HOSPITAL_COMMUNITY): Admission: EM | Admit: 2004-08-25 | Discharge: 2004-08-25 | Payer: Self-pay | Admitting: Family Medicine

## 2004-09-06 ENCOUNTER — Ambulatory Visit: Payer: Self-pay | Admitting: Oncology

## 2004-09-16 ENCOUNTER — Emergency Department (HOSPITAL_COMMUNITY): Admission: EM | Admit: 2004-09-16 | Discharge: 2004-09-16 | Payer: Self-pay | Admitting: Family Medicine

## 2004-09-17 ENCOUNTER — Emergency Department (HOSPITAL_COMMUNITY): Admission: EM | Admit: 2004-09-17 | Discharge: 2004-09-17 | Payer: Self-pay | Admitting: Emergency Medicine

## 2004-09-21 ENCOUNTER — Ambulatory Visit (HOSPITAL_COMMUNITY): Admission: RE | Admit: 2004-09-21 | Discharge: 2004-09-21 | Payer: Self-pay | Admitting: Oncology

## 2004-09-28 ENCOUNTER — Emergency Department (HOSPITAL_COMMUNITY): Admission: EM | Admit: 2004-09-28 | Discharge: 2004-09-28 | Payer: Self-pay | Admitting: Family Medicine

## 2004-10-04 ENCOUNTER — Ambulatory Visit (HOSPITAL_COMMUNITY): Admission: RE | Admit: 2004-10-04 | Discharge: 2004-10-04 | Payer: Self-pay | Admitting: Oncology

## 2004-10-14 IMAGING — CT CT ABDOMEN W/ CM
1 of 4 series · 12 of 32 positions shown, 18 images · IV contrast (omnipaque)
Comparison: Made with prior study on 03/03/04.

CLINICAL DATA: Restaging for right breast cancer.  Abdominal pain and hematochezia.
TECHNIQUE: Multidetector CT imaging of the chest, abdomen, and pelvis was performed during administration of 100 cc Omnipaque 300 intravenous contrast.  Oral contrast was also administered.

[Series 4: a/p 5.0 b30f · axial · 0.74mm/px · z∈[+1255,+1580]mm · 12 of 77 slices shown, 18 images]
[im 6/77  soft-tissue]
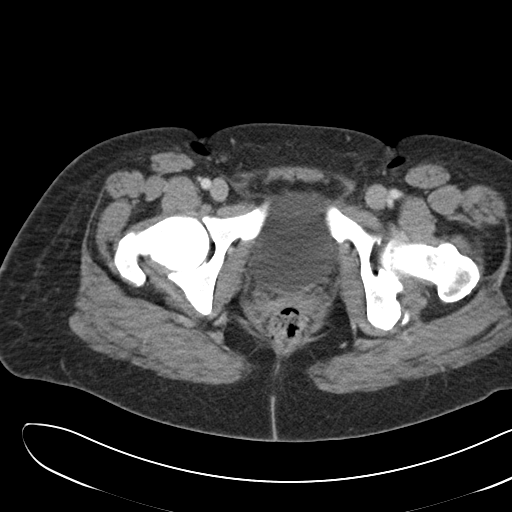
[im 6/77  bone]
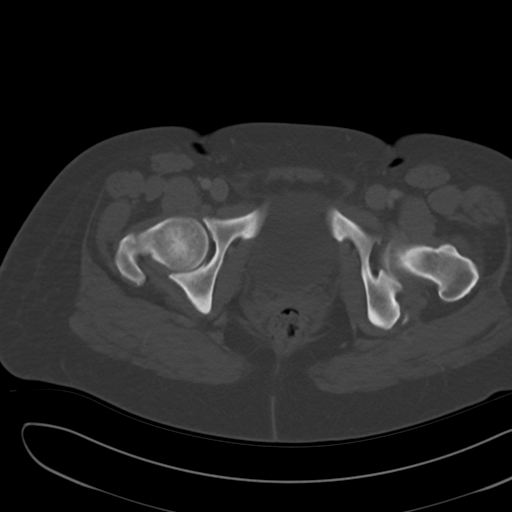
[im 11/77  soft-tissue]
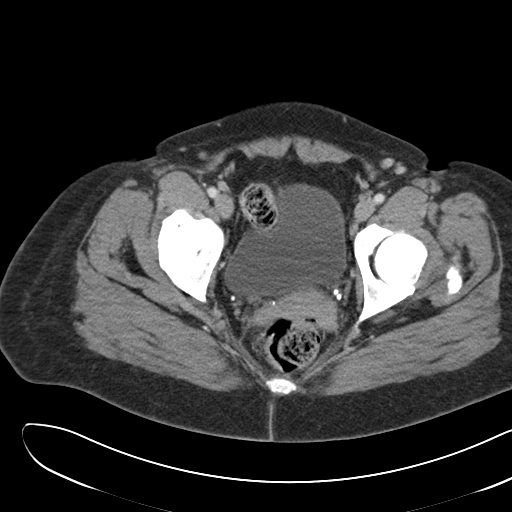
[im 17/77  soft-tissue]
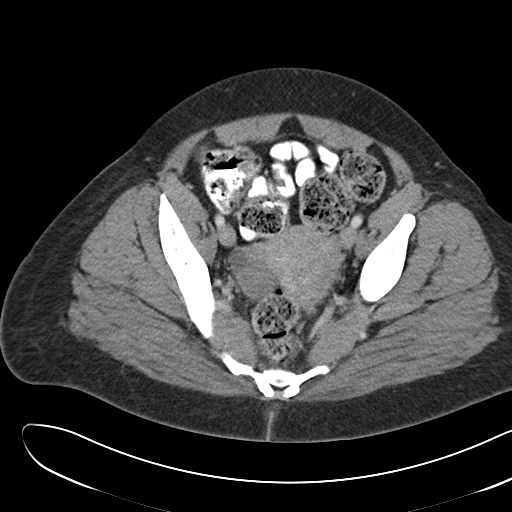
[im 22/77  soft-tissue]
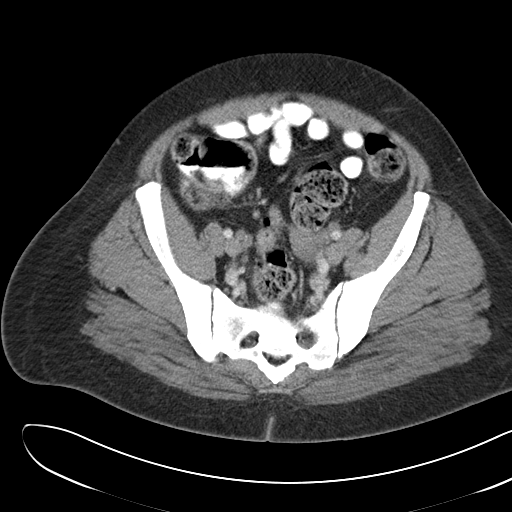
[im 28/77  soft-tissue]
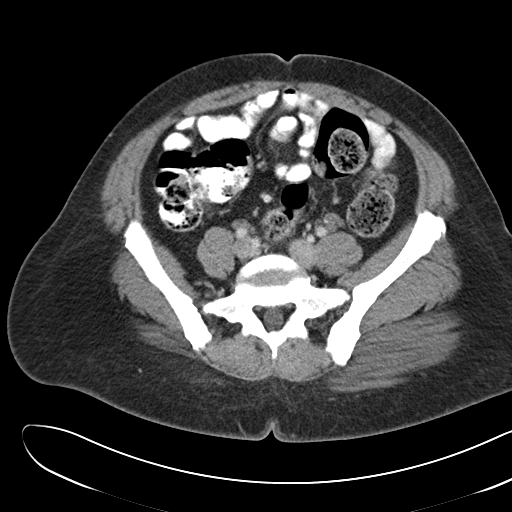
[im 33/77  soft-tissue]
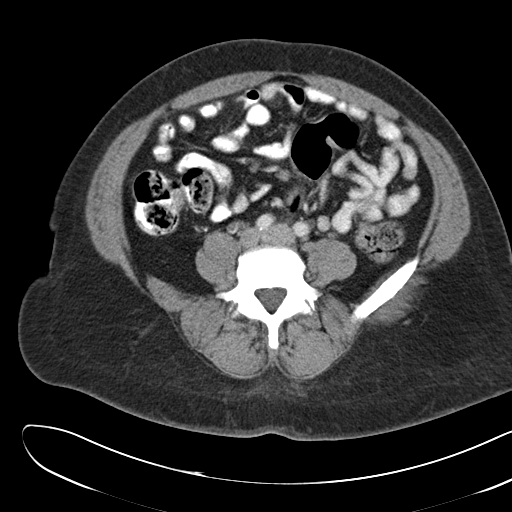
[im 44/77  soft-tissue]
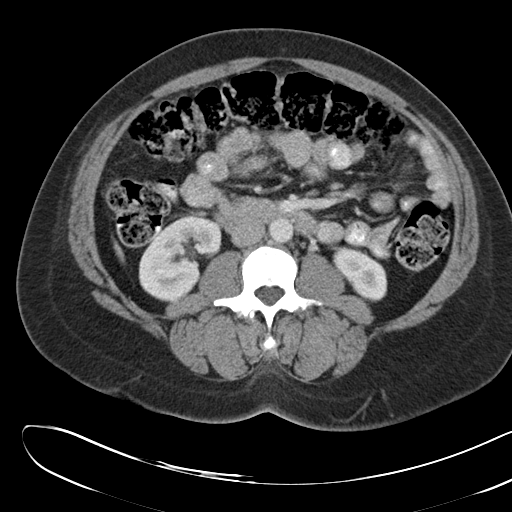
[im 49/77  soft-tissue]
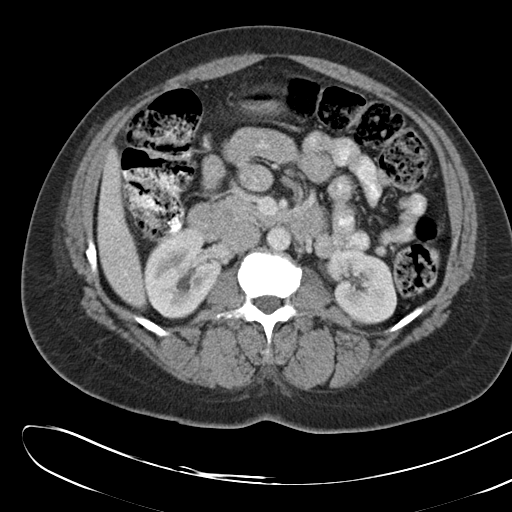
[im 55/77  soft-tissue]
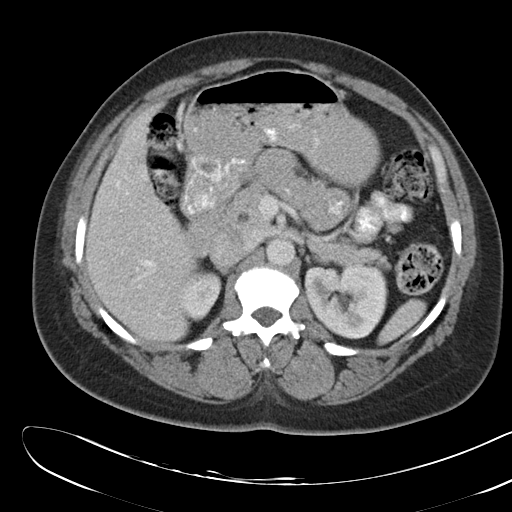
[im 55/77  lung]
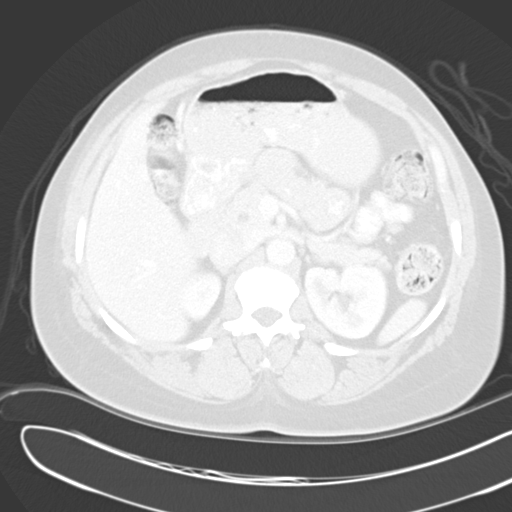
[im 55/77  bone]
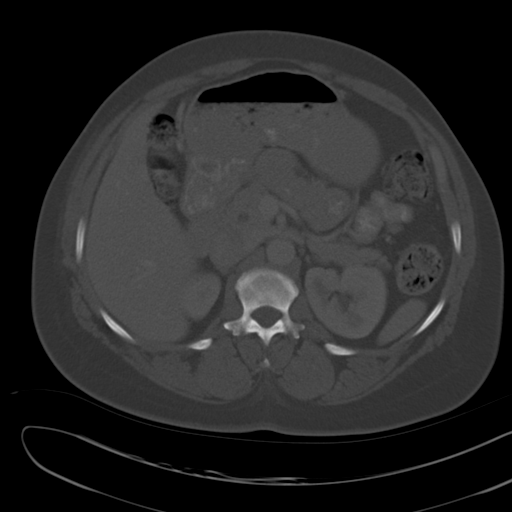
[im 60/77  soft-tissue]
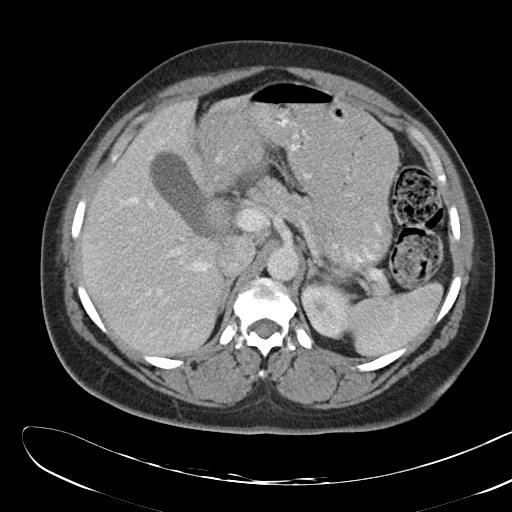
[im 60/77  lung]
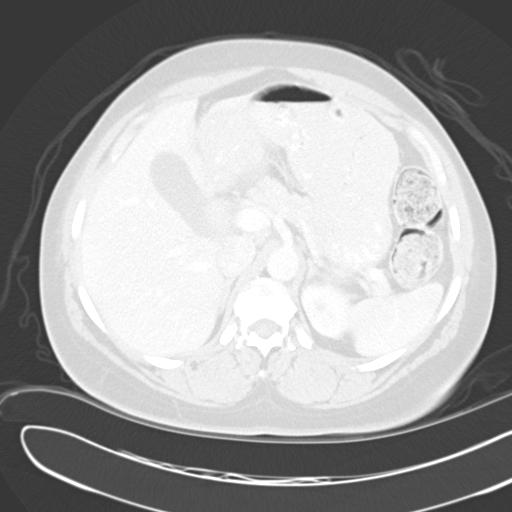
[im 66/77  soft-tissue]
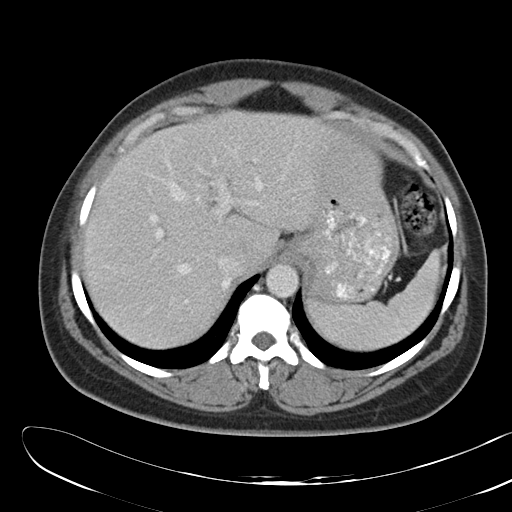
[im 66/77  lung]
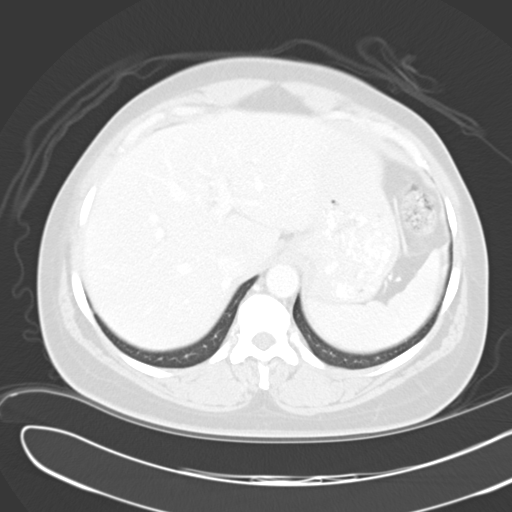
[im 71/77  soft-tissue]
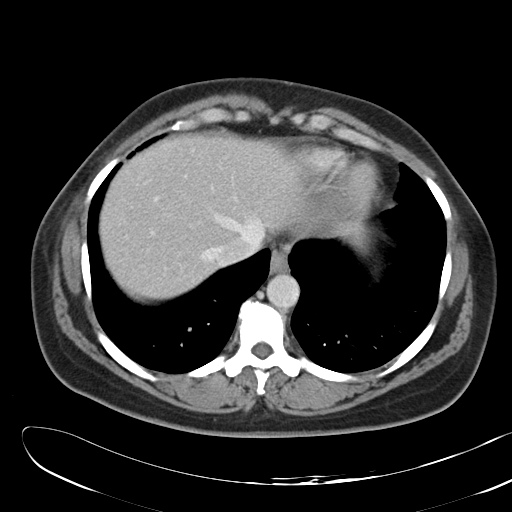
[im 71/77  lung]
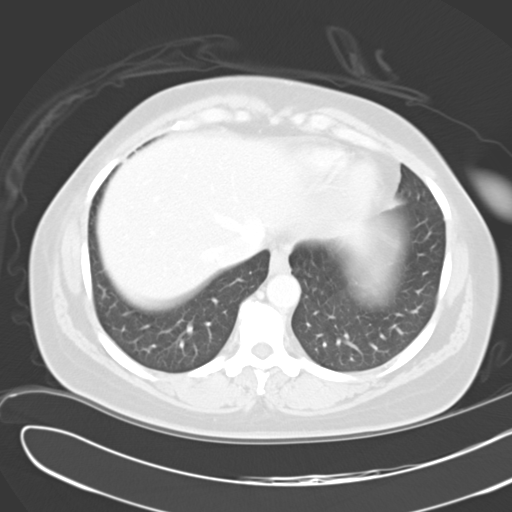

[12 of 32 positions shown; findings below may reference images not displayed]

CT CHEST WITH CONTRAST
 Decreased size and number of abnormal soft tissue nodules in the right breast is noted since prior study.  Mild decrease in skin thickening is also seen in the right breast.  Surgical clips are again noted within the right axilla.  There is no evidence of lymphadenopathy within the thorax.  No other hilar or mediastinal masses are identified.
 There is no evidence of pleural or pericardial effusion.  Mild infiltrate is again seen in the anterior right middle lobe which is unchanged and consistent with history of prior radiation therapy.  There is also a tiny less than 5 mm pulmonary nodule seen in the posterior right lower lobe, which remains stable since previous study.  No new or enlarging pulmonary nodules or masses are seen.
 IMPRESSION
 1.  Decreased abnormal soft tissue nodularity and skin thickening of the right breast.  Stable tiny indeterminate right lower lobe pulmonary nodule also noted.
 2.  No evidence of new or progressive disease within the thorax.  
 CT ABDOMEN WITH CONTRAST
 A tiny less than 1 cm cyst is again seen in the inferior right hepatic lobe, which is stable.  No liver masses are identified.  The spleen, pancreas, gallbladder, adrenal glands, kidneys are normal in appearance.  
 There is no evidence of abdominal lymphadenopathy.  There is no evidence of inflammatory process or abnormal fluid collections within the abdomen.  A large amount of colonic stool is noted.  
 IMPRESSION
 1.  No evidence of metastatic disease or other acute process.
 2.  Large amount of colonic stool noted.  
 CT PELVIS WITH CONTRAST
 There is no evidence of pelvic masses or adenopathy.  Small right ovarian cysts remain stable.  There is no evidence of inflammatory process or abnormal fluid collections.  There is no evidence of ascites.
 IMPRESSION
 Stable pelvis CT.  No evidence of metastatic disease or other acute process.

## 2004-10-15 ENCOUNTER — Emergency Department (HOSPITAL_COMMUNITY): Admission: EM | Admit: 2004-10-15 | Discharge: 2004-10-15 | Payer: Self-pay | Admitting: Emergency Medicine

## 2004-10-18 ENCOUNTER — Emergency Department (HOSPITAL_COMMUNITY): Admission: EM | Admit: 2004-10-18 | Discharge: 2004-10-18 | Payer: Self-pay | Admitting: *Deleted

## 2004-10-26 ENCOUNTER — Emergency Department (HOSPITAL_COMMUNITY): Admission: EM | Admit: 2004-10-26 | Discharge: 2004-10-26 | Payer: Self-pay | Admitting: Family Medicine

## 2004-10-26 ENCOUNTER — Ambulatory Visit: Payer: Self-pay | Admitting: Oncology

## 2004-10-26 ENCOUNTER — Emergency Department (HOSPITAL_COMMUNITY): Admission: EM | Admit: 2004-10-26 | Discharge: 2004-10-26 | Payer: Self-pay | Admitting: Emergency Medicine

## 2004-10-29 IMAGING — MR MR BREAST BILAT WO/W CM
9 of 18 series · 19 of 48 positions shown · non-contrast
Comparison: none

Bilateral CC and MLO view(s) were taken.
Technologist: Jamileth Gavidia

FINDINGS
MR BREAST BILATERAL WITH/WITHOUT CONTRAST
HISTORY: History of right breast cancer in 5777.  In February 2004, the patient had diffuse hardening
throughout the right breast.  Comparison is made with MRI on 02/20/04.

[Series 3: T2 · axial · 3.0mm · 0.76mm/px · 1 of 41 slices shown]
[im 1/41]
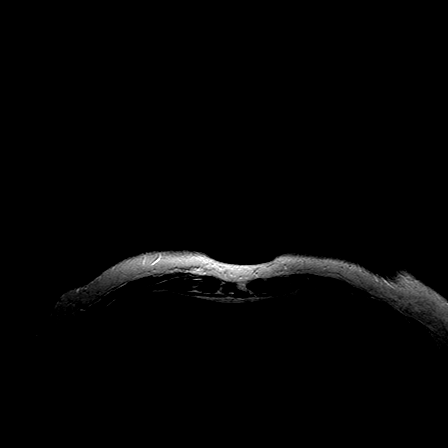

[Series 4: fl3d dynamic pre · axial · non-contrast · 1.8mm · 0.66mm/px · z∈[-89,+67]mm · 3 of 88 slices shown]
[im 1/88]
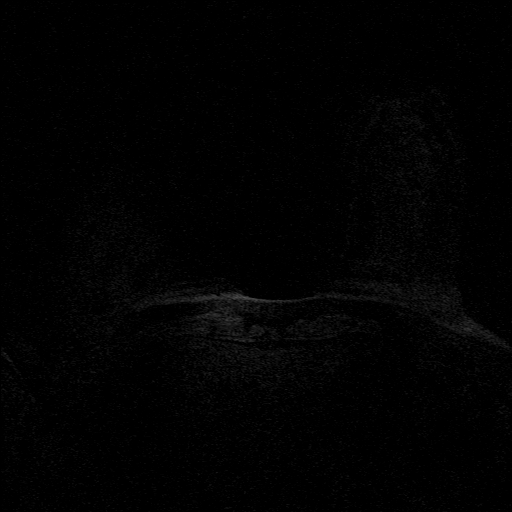
[im 44/88]
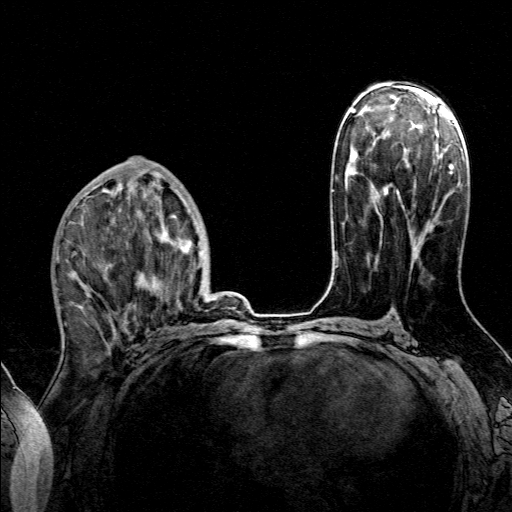
[im 88/88]
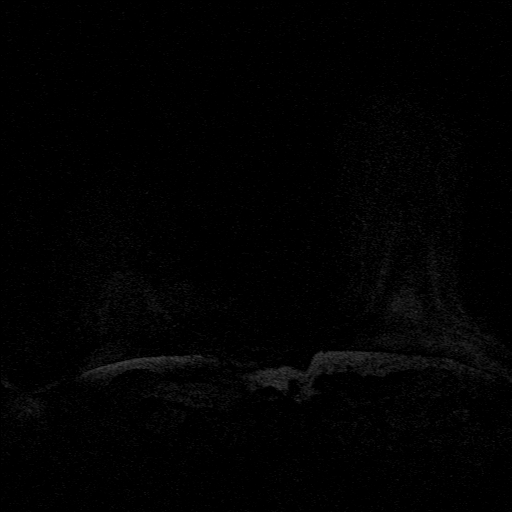

[Series 5: fl3d dynamic (id) · axial · 1.8mm · 0.66mm/px · z∈[-89,+67]mm · 3 of 88 slices shown (1 of 5)]
[im 1/88]
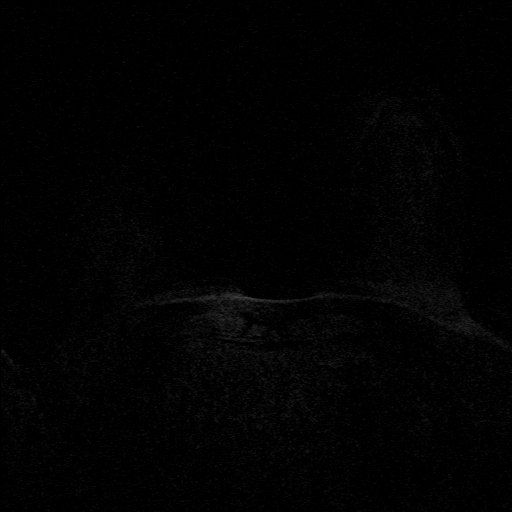
[im 44/88]
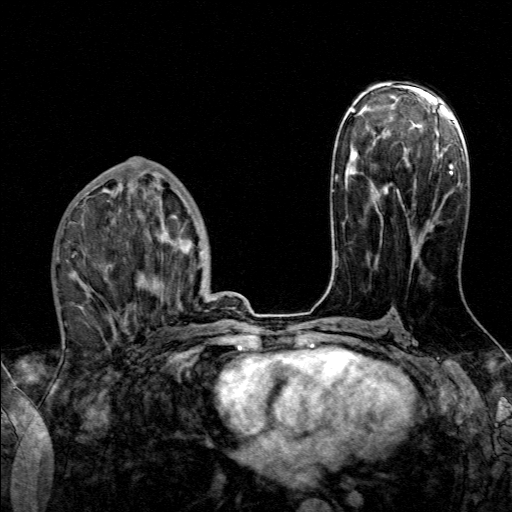
[im 88/88]
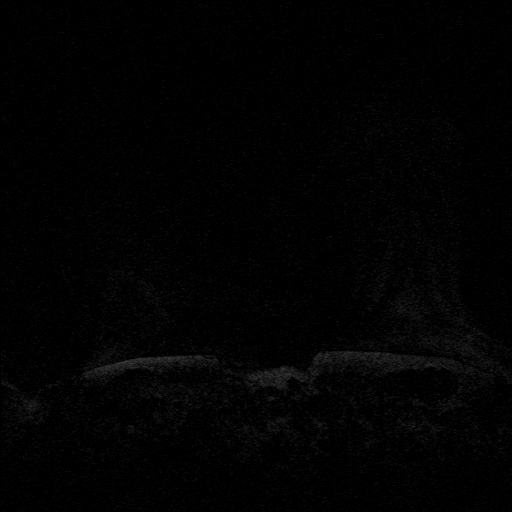

[Series 6: fl3d dynamic (id)_sub · axial · 1.8mm · 0.66mm/px · z∈[-89,+67]mm · 3 of 88 slices shown]
[im 1/88]
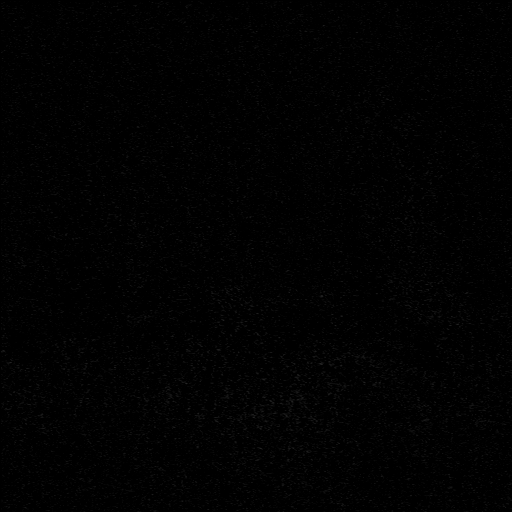
[im 44/88]
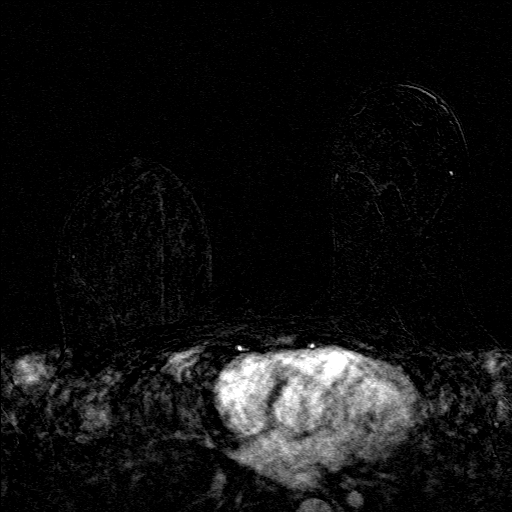
[im 88/88]
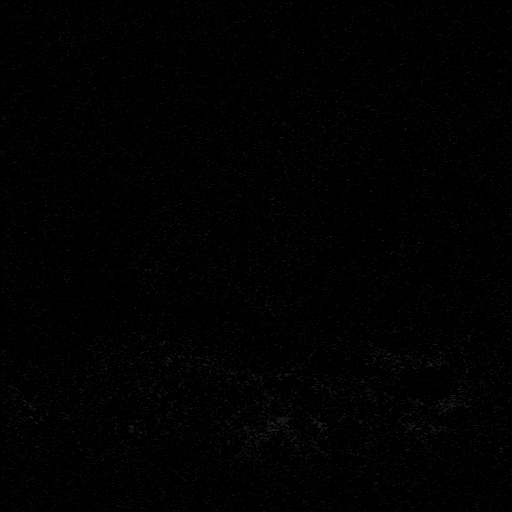

[Series 7: fl3d dynamic (id)_sub_mip_tra · axial · 158.4mm · 0.66mm/px · 1 of 1 slices shown]
[im 1/1]
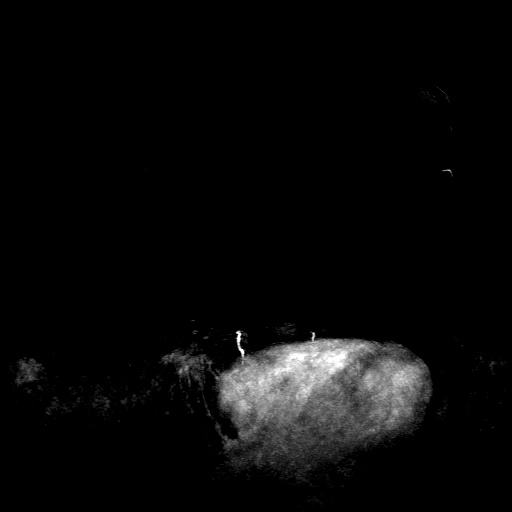

[Series 8: fl3d dynamic (id) · axial · 1.8mm · 0.66mm/px · z∈[-89,+67]mm · 3 of 88 slices shown (2 of 5)]
[im 1/88]
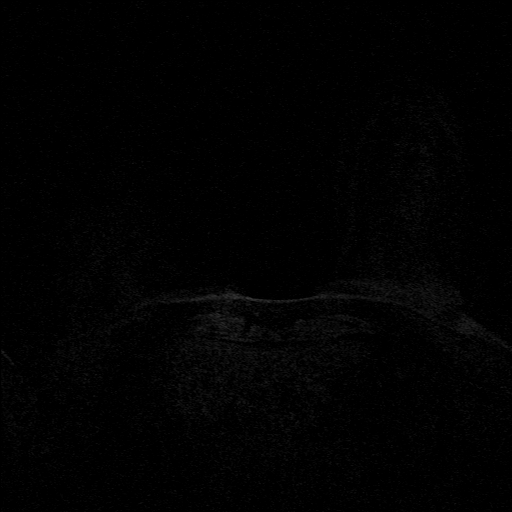
[im 44/88]
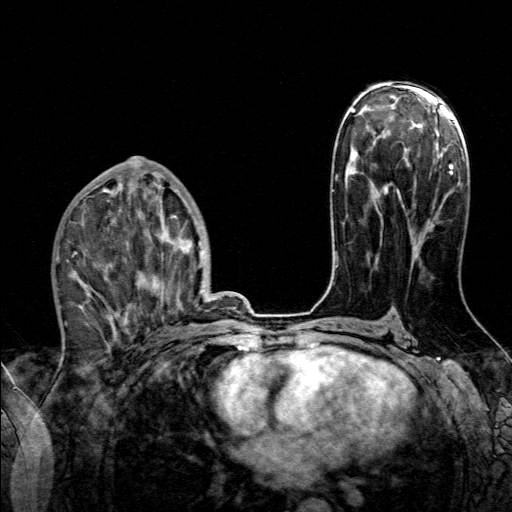
[im 88/88]
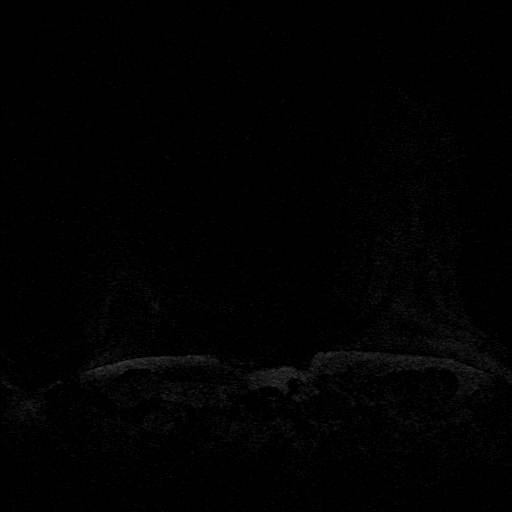

[Series 9: fl3d dynamic (id) · axial · 1.8mm · 0.66mm/px · z∈[-89,+67]mm · 3 of 88 slices shown (3 of 5)]
[im 1/88]
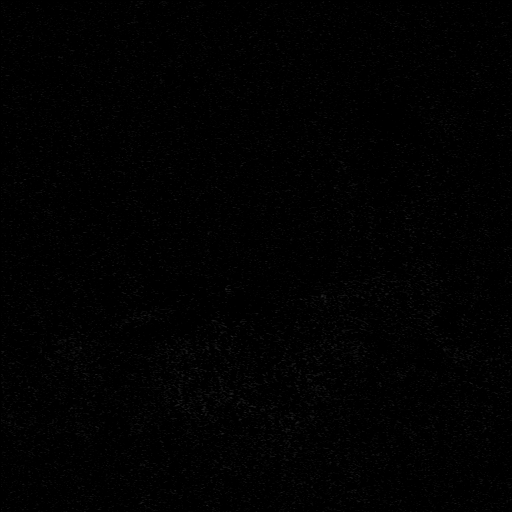
[im 44/88]
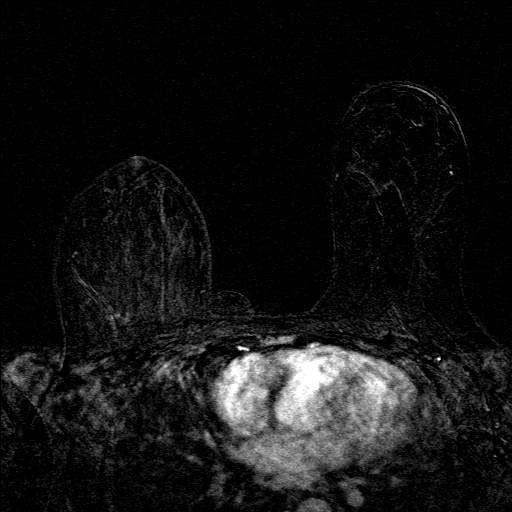
[im 88/88]
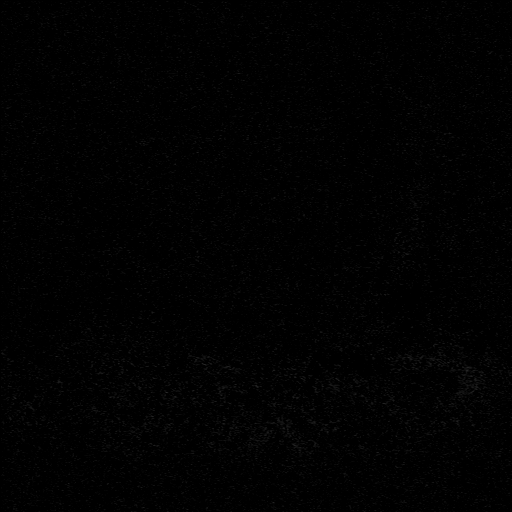

[Series 10: fl3d dynamic (id) · axial · 158.4mm · 0.66mm/px · 1 of 1 slices shown (4 of 5)]
[im 1/1]
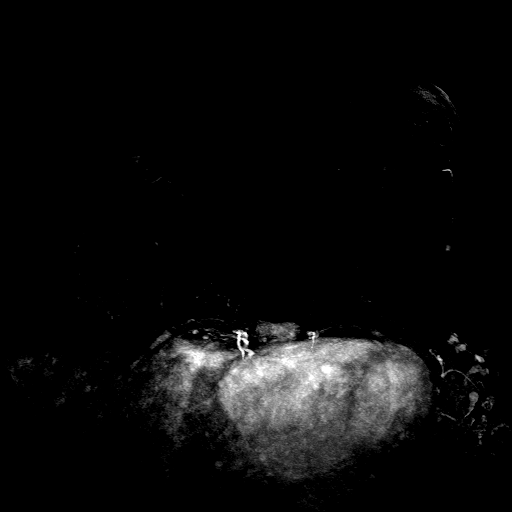

[Series 11: fl3d dynamic (id) · axial · 1.8mm · 0.66mm/px · 1 of 88 slices shown (5 of 5)]
[im 1/88]
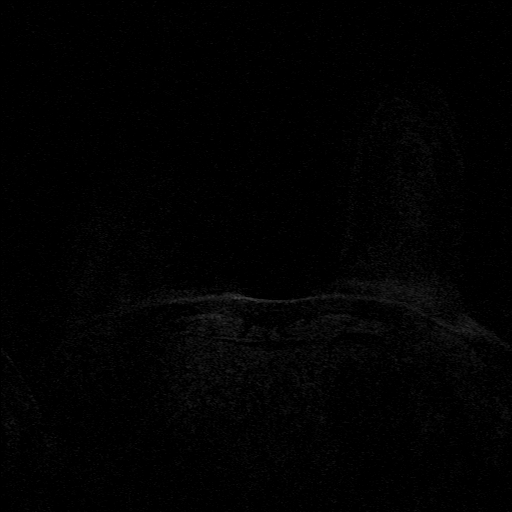

[19 of 48 positions shown; findings below may reference images not displayed]

Axial  images are acquired  through both breasts prior to and following the administration of 18 cc
of Omniscan.  There continues to be diffuse skin thickening throughout the right breast though
there has been significant improvement in innumerable nodules throughout the breast.  There is only
persistent mild asymmetric but diffuse enhancement within the right breast compared to the left.
No specific mass is identified within the right breast at this time.  There is no specific evidence
for malignancy within the left breast.

IMPRESSION

There has been significant improvement within the right breast .  No discrete nodules are
identified on the current exam, which represents a significant  change compared to innumerable
nodules seen on the prior study.  There is mild, vague enhancement within the right breast which is
asymmetric compared to the left.  Right breast remains smaller with marked skin thickening.

ASSESSMENT: Known biopsy proven malignancy - BI-RADS 6

Treatment plan.

## 2004-11-15 ENCOUNTER — Encounter: Admission: RE | Admit: 2004-11-15 | Discharge: 2004-11-15 | Payer: Self-pay | Admitting: General Surgery

## 2004-11-20 ENCOUNTER — Emergency Department (HOSPITAL_COMMUNITY): Admission: EM | Admit: 2004-11-20 | Discharge: 2004-11-20 | Payer: Self-pay | Admitting: Family Medicine

## 2004-11-24 ENCOUNTER — Emergency Department (HOSPITAL_COMMUNITY): Admission: EM | Admit: 2004-11-24 | Discharge: 2004-11-24 | Payer: Self-pay | Admitting: Family Medicine

## 2004-11-24 ENCOUNTER — Ambulatory Visit (HOSPITAL_COMMUNITY): Admission: RE | Admit: 2004-11-24 | Discharge: 2004-11-24 | Payer: Self-pay | Admitting: Oncology

## 2004-11-25 ENCOUNTER — Emergency Department (HOSPITAL_COMMUNITY): Admission: EM | Admit: 2004-11-25 | Discharge: 2004-11-25 | Payer: Self-pay | Admitting: Emergency Medicine

## 2004-11-27 ENCOUNTER — Emergency Department (HOSPITAL_COMMUNITY): Admission: EM | Admit: 2004-11-27 | Discharge: 2004-11-27 | Payer: Self-pay | Admitting: Family Medicine

## 2004-11-27 ENCOUNTER — Emergency Department (HOSPITAL_COMMUNITY): Admission: EM | Admit: 2004-11-27 | Discharge: 2004-11-27 | Payer: Self-pay | Admitting: Emergency Medicine

## 2004-11-30 ENCOUNTER — Emergency Department (HOSPITAL_COMMUNITY): Admission: EM | Admit: 2004-11-30 | Discharge: 2004-11-30 | Payer: Self-pay | Admitting: *Deleted

## 2004-12-07 ENCOUNTER — Emergency Department (HOSPITAL_COMMUNITY): Admission: EM | Admit: 2004-12-07 | Discharge: 2004-12-07 | Payer: Self-pay | Admitting: Family Medicine

## 2004-12-12 ENCOUNTER — Emergency Department (HOSPITAL_COMMUNITY): Admission: EM | Admit: 2004-12-12 | Discharge: 2004-12-12 | Payer: Self-pay | Admitting: Family Medicine

## 2004-12-13 IMAGING — NM NM CARDIA MUGA REST
6 series · 36 of 36 positions shown · non-contrast
Comparison: none

CLINICAL DATA: History of breast cancer.

[Series 1: mu muga · 4.34mm/px · 6 of 16 frames shown (1 of 6)]
[frame 2/16  full-range]
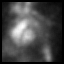
[frame 4/16  full-range]
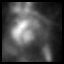
[frame 7/16  full-range]
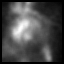
[frame 10/16  full-range]
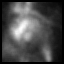
[frame 12/16  full-range]
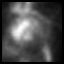
[frame 15/16  full-range]
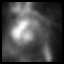

[Series 1: mu muga · 4.34mm/px · 6 of 16 frames shown (2 of 6)]
[frame 2/16]
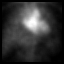
[frame 4/16]
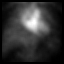
[frame 7/16]
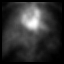
[frame 10/16]
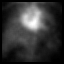
[frame 12/16]
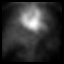
[frame 15/16]
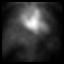

[Series 1: mu muga · 4.34mm/px · 6 of 16 frames shown (3 of 6)]
[frame 2/16  full-range]
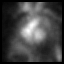
[frame 4/16  full-range]
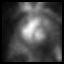
[frame 7/16  full-range]
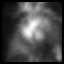
[frame 10/16  full-range]
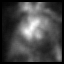
[frame 12/16  full-range]
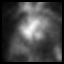
[frame 15/16  full-range]
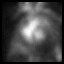

[Series 1: mu muga · 4.34mm/px · 6 of 16 frames shown (4 of 6)]
[frame 2/16]
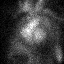
[frame 4/16]
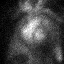
[frame 7/16]
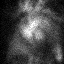
[frame 10/16]
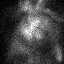
[frame 12/16]
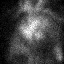
[frame 15/16]
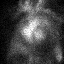

[Series 1: mu muga · 4.34mm/px · 6 of 16 frames shown (5 of 6)]
[frame 2/16]
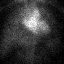
[frame 4/16]
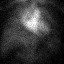
[frame 7/16]
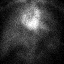
[frame 10/16]
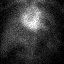
[frame 12/16]
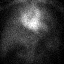
[frame 15/16]
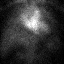

[Series 1: mu muga · 4.34mm/px · 6 of 16 frames shown (6 of 6)]
[frame 2/16]
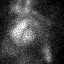
[frame 4/16]
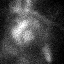
[frame 7/16]
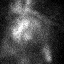
[frame 10/16]
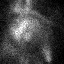
[frame 12/16]
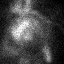
[frame 15/16]
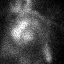

[36 of 36 positions shown; findings below may reference images not displayed]

RESTING MUGA 

Red cells were tagged with 23.2 mCi ScMMm Pertechnetate.

First ejection fractions calculated to be 52.8% and the second 55%.

There is satisfactory wall motion with slight decreased contraction region of the septum.

IMPRESSION
Average ejection fraction is 54%.

Wall motion is within normal limits.

## 2004-12-14 ENCOUNTER — Ambulatory Visit: Payer: Self-pay | Admitting: Oncology

## 2004-12-16 ENCOUNTER — Ambulatory Visit: Payer: Self-pay | Admitting: Family Medicine

## 2004-12-18 ENCOUNTER — Emergency Department (HOSPITAL_COMMUNITY): Admission: EM | Admit: 2004-12-18 | Discharge: 2004-12-18 | Payer: Self-pay | Admitting: Family Medicine

## 2005-01-07 ENCOUNTER — Other Ambulatory Visit: Admission: RE | Admit: 2005-01-07 | Discharge: 2005-01-07 | Payer: Self-pay | Admitting: Family Medicine

## 2005-01-07 ENCOUNTER — Emergency Department (HOSPITAL_COMMUNITY): Admission: EM | Admit: 2005-01-07 | Discharge: 2005-01-07 | Payer: Self-pay | Admitting: Family Medicine

## 2005-01-07 ENCOUNTER — Ambulatory Visit: Payer: Self-pay | Admitting: Family Medicine

## 2005-01-31 ENCOUNTER — Ambulatory Visit: Payer: Self-pay | Admitting: Oncology

## 2005-02-04 IMAGING — CT CT CHEST W/ CM
1 of 5 series · 13 of 29 positions shown, 17 images · IV contrast (omnipaque)
Comparison: 05/31/04 and 03/11/04.
CT CHEST WITH CONTRAST:

CLINICAL DATA: 48 year old female, breast carcinoma.  Chemotherapy in progress.  Status post right mastectomy.
CT SCANS OF THE CHEST AND ABDOMEN WITH ORAL AND IV CONTRAST ? 09/21/04:
TECHNIQUE: 150 cc of Omnipaque 300 contrast was administered intravenous and helical images performed of the chest and abdomen.

[Series 5: — · axial · 0.75mm/px · z∈[-435,-100]mm · 13 of 81 slices shown, 17 images]
[im 7/81  mediastinal]
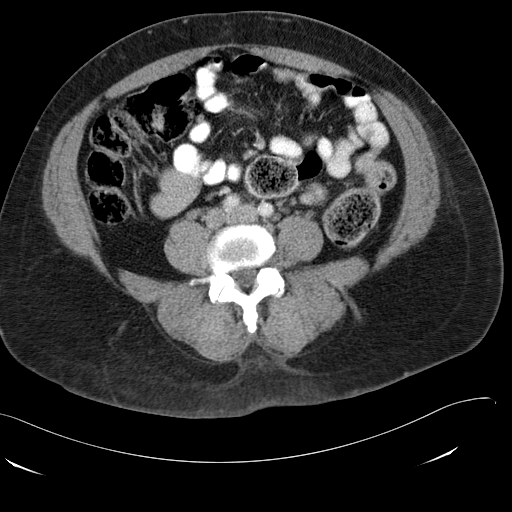
[im 7/81  lung]
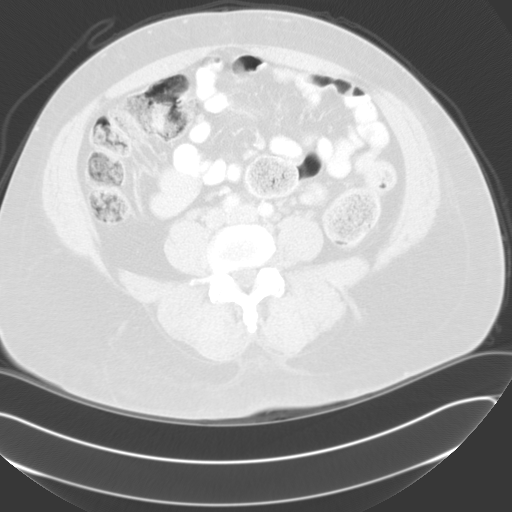
[im 13/81  lung]
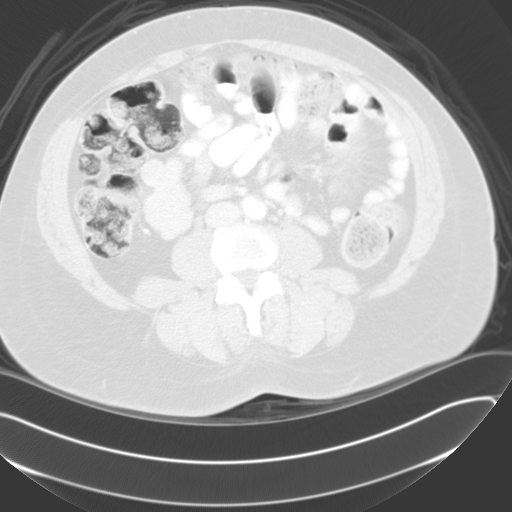
[im 19/81  lung]
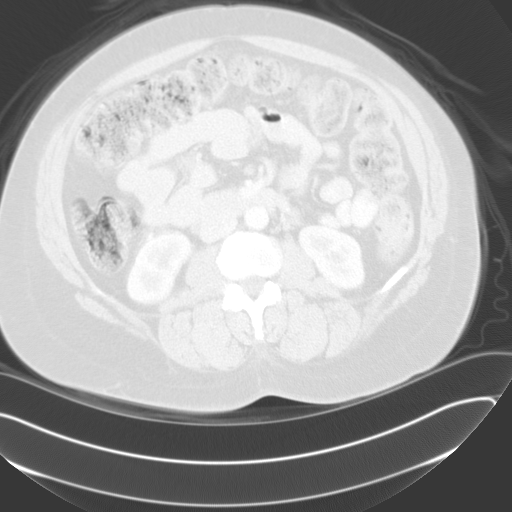
[im 25/81  lung]
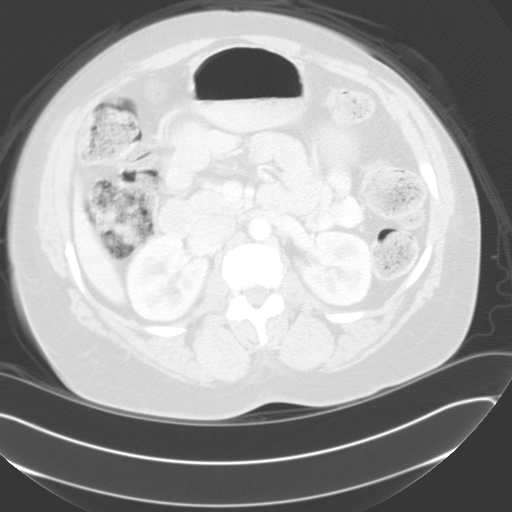
[im 31/81  mediastinal]
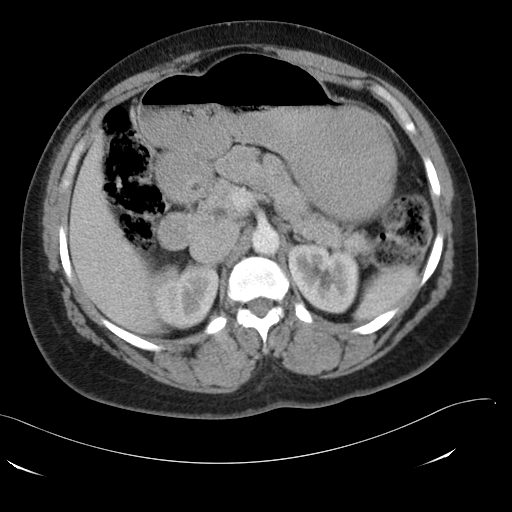
[im 31/81  lung]
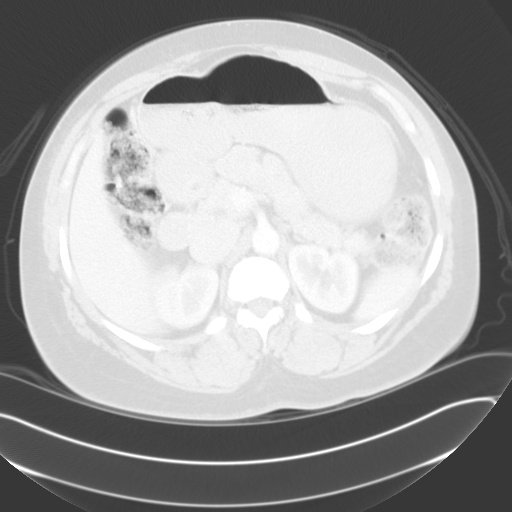
[im 37/81  lung]
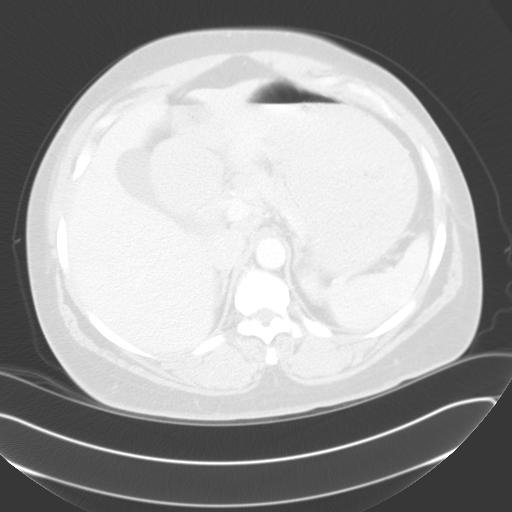
[im 39/81  lung]
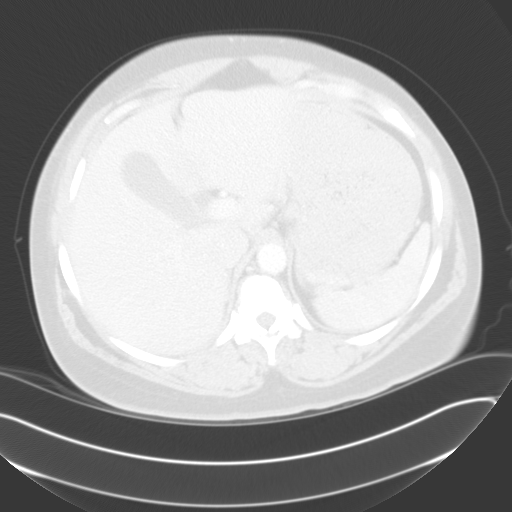
[im 44/81  lung]
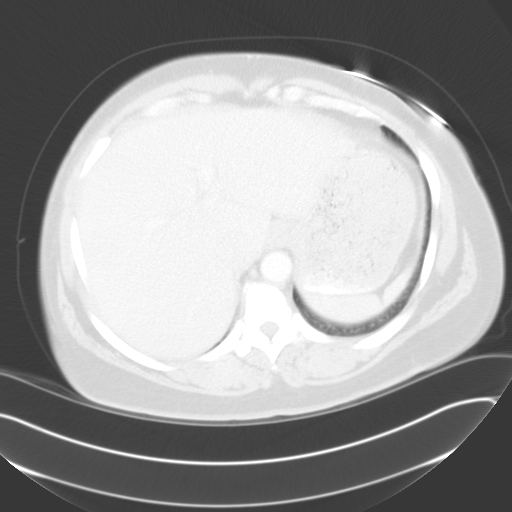
[im 50/81  mediastinal]
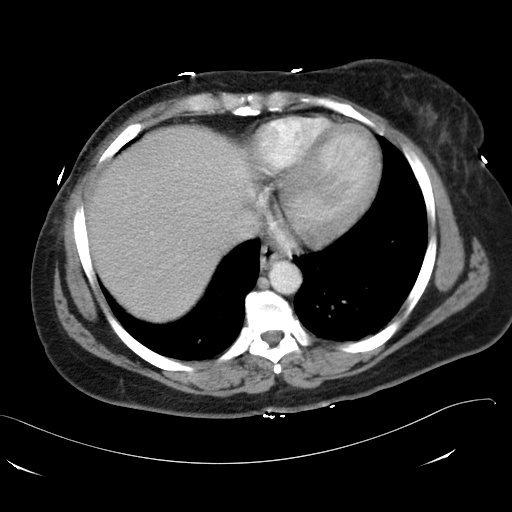
[im 50/81  lung]
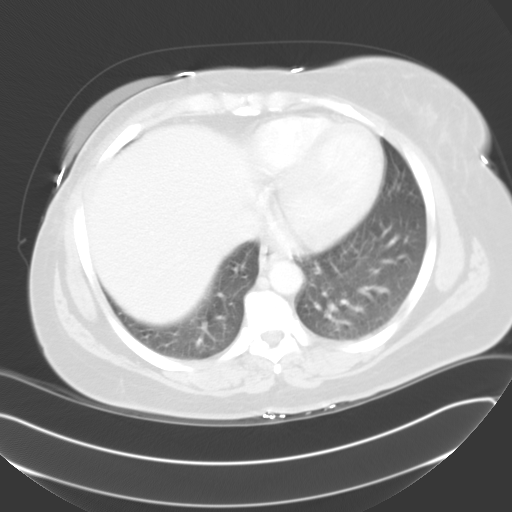
[im 56/81  lung]
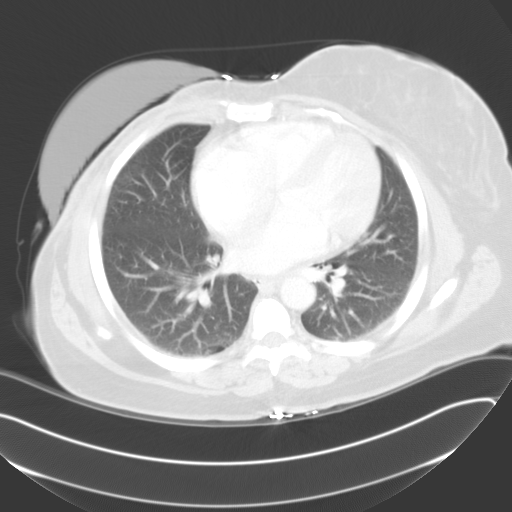
[im 62/81  lung]
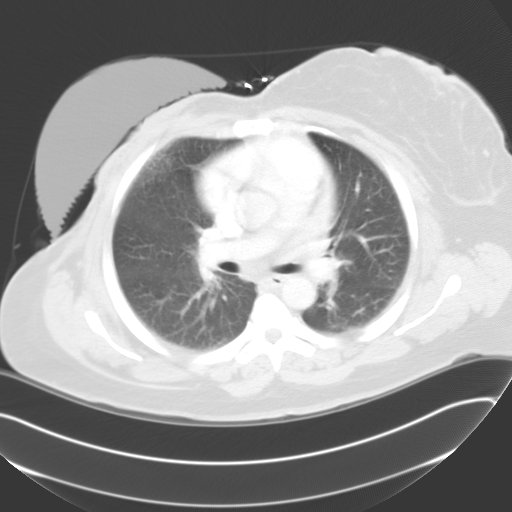
[im 68/81  lung]
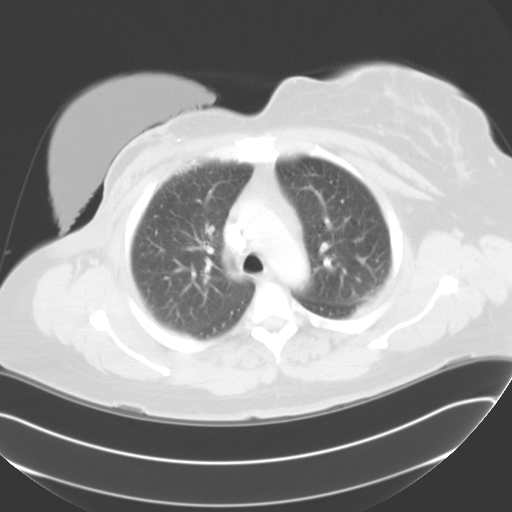
[im 74/81  mediastinal]
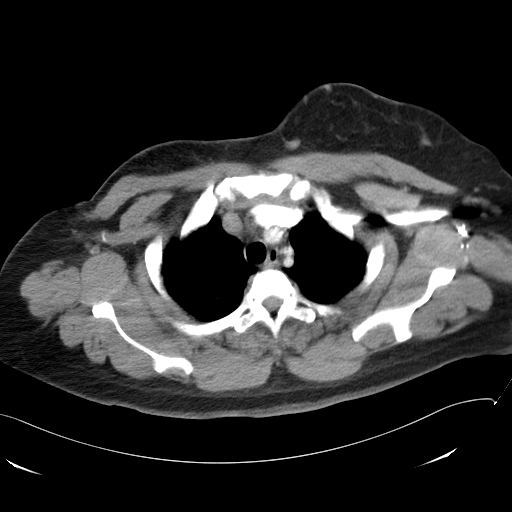
[im 74/81  lung]
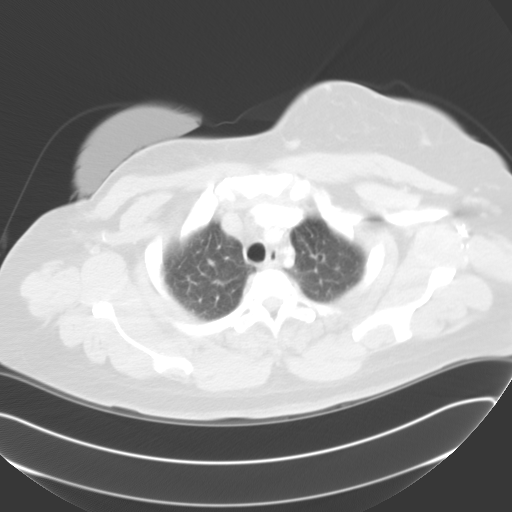

[13 of 29 positions shown; findings below may reference images not displayed]

FINDINGS: The patient has undergone a right mastectomy and axillary lymph node dissection.  External breast prosthesis is in place.  Postoperative changes are evident of the right anterior chest wall.  In the right axilla, there is a small fluid collection.  It measures about 4.6 x 1.7 cm and is consistent with a residual hematoma or seroma in the right axilla.  Small residual nonenlarged subpectoral and right axillary lymph nodes are noted.  No left enlarged lymph nodes.  A right port-a-catheter is evident.  Within the chest, there is no mediastinal or hilar adenopathy.  The heart size is normal.  No pericardial or pleural fluid.  
The lung windows demonstrate minor subpleural interstitial or fibrotic change in the right upper lobe.  This is stable in appearance.  In the superior segment of the right lower lobe, there is a stable subpleural noncalcified nodule (image #24).  No left lung nodules.  The appearance of the chest is overall stable except for the postoperative findings.
IMPRESSION: 1.  Status post right mastectomy and axillary lymph node dissection. 
2.  Small simple appearing fluid collection in the right axilla, likely resolving seroma or hematoma.
3.  No lymphadenopathy.
4.  Stable right lower lobe subcentimeter nodule.
CT ABDOMEN WITH CONTRAST:
FINDINGS: Continuing into the abdomen, the liver demonstrates a stable low density lesion inferiorly in the right hepatic lobe measuring 10 mm most consistent with a small hepatic cyst.  No other definite hepatic lesion.  The gallbladder, biliary system, spleen, adrenal glands, pancreas, and kidneys are all normal.  No bowel obstruction, dilatation, ascites, or free air.  There are stable nonenlarged but numerous retroperitoneal lymph nodes about the aorta along the infrarenal portion extending into the pelvis.
IMPRESSION: 1.  No acute finding in the abdomen or evidence of definite metastatic disease.  Stable examination.

## 2005-02-14 ENCOUNTER — Emergency Department (HOSPITAL_COMMUNITY): Admission: EM | Admit: 2005-02-14 | Discharge: 2005-02-14 | Payer: Self-pay | Admitting: Family Medicine

## 2005-03-15 ENCOUNTER — Ambulatory Visit: Payer: Self-pay | Admitting: Psychology

## 2005-03-21 ENCOUNTER — Ambulatory Visit: Payer: Self-pay | Admitting: Oncology

## 2005-03-22 ENCOUNTER — Ambulatory Visit: Payer: Self-pay | Admitting: Psychology

## 2005-03-29 ENCOUNTER — Ambulatory Visit: Payer: Self-pay | Admitting: Psychology

## 2005-04-05 ENCOUNTER — Ambulatory Visit: Payer: Self-pay | Admitting: Psychology

## 2005-04-09 IMAGING — NM NM CARDIA MUGA REST
6 series · 36 of 36 positions shown · non-contrast
Comparison: none

CLINICAL DATA: Breast carcinoma.
 CARDIAC RESTING MUGA STUDY:
TECHNIQUE: 21 mCi Technetium labeled red cells were injected.  Resting left ventricular ejection fraction was calculated.  
 First ejection fraction was calculated at 50.1%.  The second ejection fraction was 52.6%.  There is satisfactory wall motion of the left ventricle.

[Series 1: mu muga · 4.34mm/px · 6 of 16 frames shown (1 of 6)]
[frame 2/16]
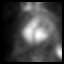
[frame 4/16]
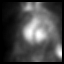
[frame 7/16]
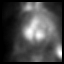
[frame 10/16]
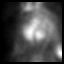
[frame 12/16]
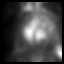
[frame 15/16]
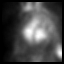

[Series 1: mu muga · 4.34mm/px · 6 of 16 frames shown (2 of 6)]
[frame 2/16]
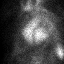
[frame 4/16]
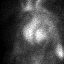
[frame 7/16]
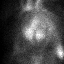
[frame 10/16]
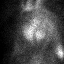
[frame 12/16]
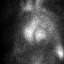
[frame 15/16]
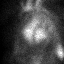

[Series 1: mu muga · 4.34mm/px · 6 of 16 frames shown (3 of 6)]
[frame 2/16]
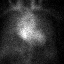
[frame 4/16]
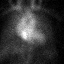
[frame 7/16]
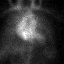
[frame 10/16]
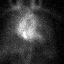
[frame 12/16]
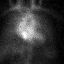
[frame 15/16]
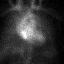

[Series 1: mu muga · 4.34mm/px · 6 of 16 frames shown (4 of 6)]
[frame 2/16]
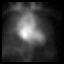
[frame 4/16]
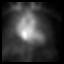
[frame 7/16]
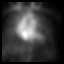
[frame 10/16]
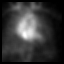
[frame 12/16]
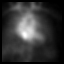
[frame 15/16]
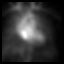

[Series 1: mu muga · 4.34mm/px · 6 of 16 frames shown (5 of 6)]
[frame 2/16]
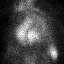
[frame 4/16]
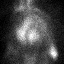
[frame 7/16]
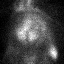
[frame 10/16]
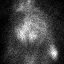
[frame 12/16]
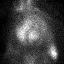
[frame 15/16]
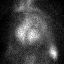

[Series 1: mu muga · 4.34mm/px · 6 of 16 frames shown (6 of 6)]
[frame 2/16]
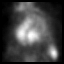
[frame 4/16]
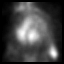
[frame 7/16]
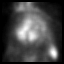
[frame 10/16]
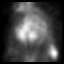
[frame 12/16]
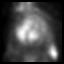
[frame 15/16]
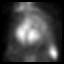

[36 of 36 positions shown; findings below may reference images not displayed]

IMPRESSION: 1.  Estimated resting left ventricular ejection fraction of 51%.  Previous ejection fraction was 54%.

## 2005-04-15 IMAGING — CR DG CHEST 2V
2 series · 2 of 2 positions shown · non-contrast
Comparison: 10/26/04 chest film.

CLINICAL DATA: Cough and congestion.
 PA AND LATERAL CHEST:

[view not recorded (1 of 2)]
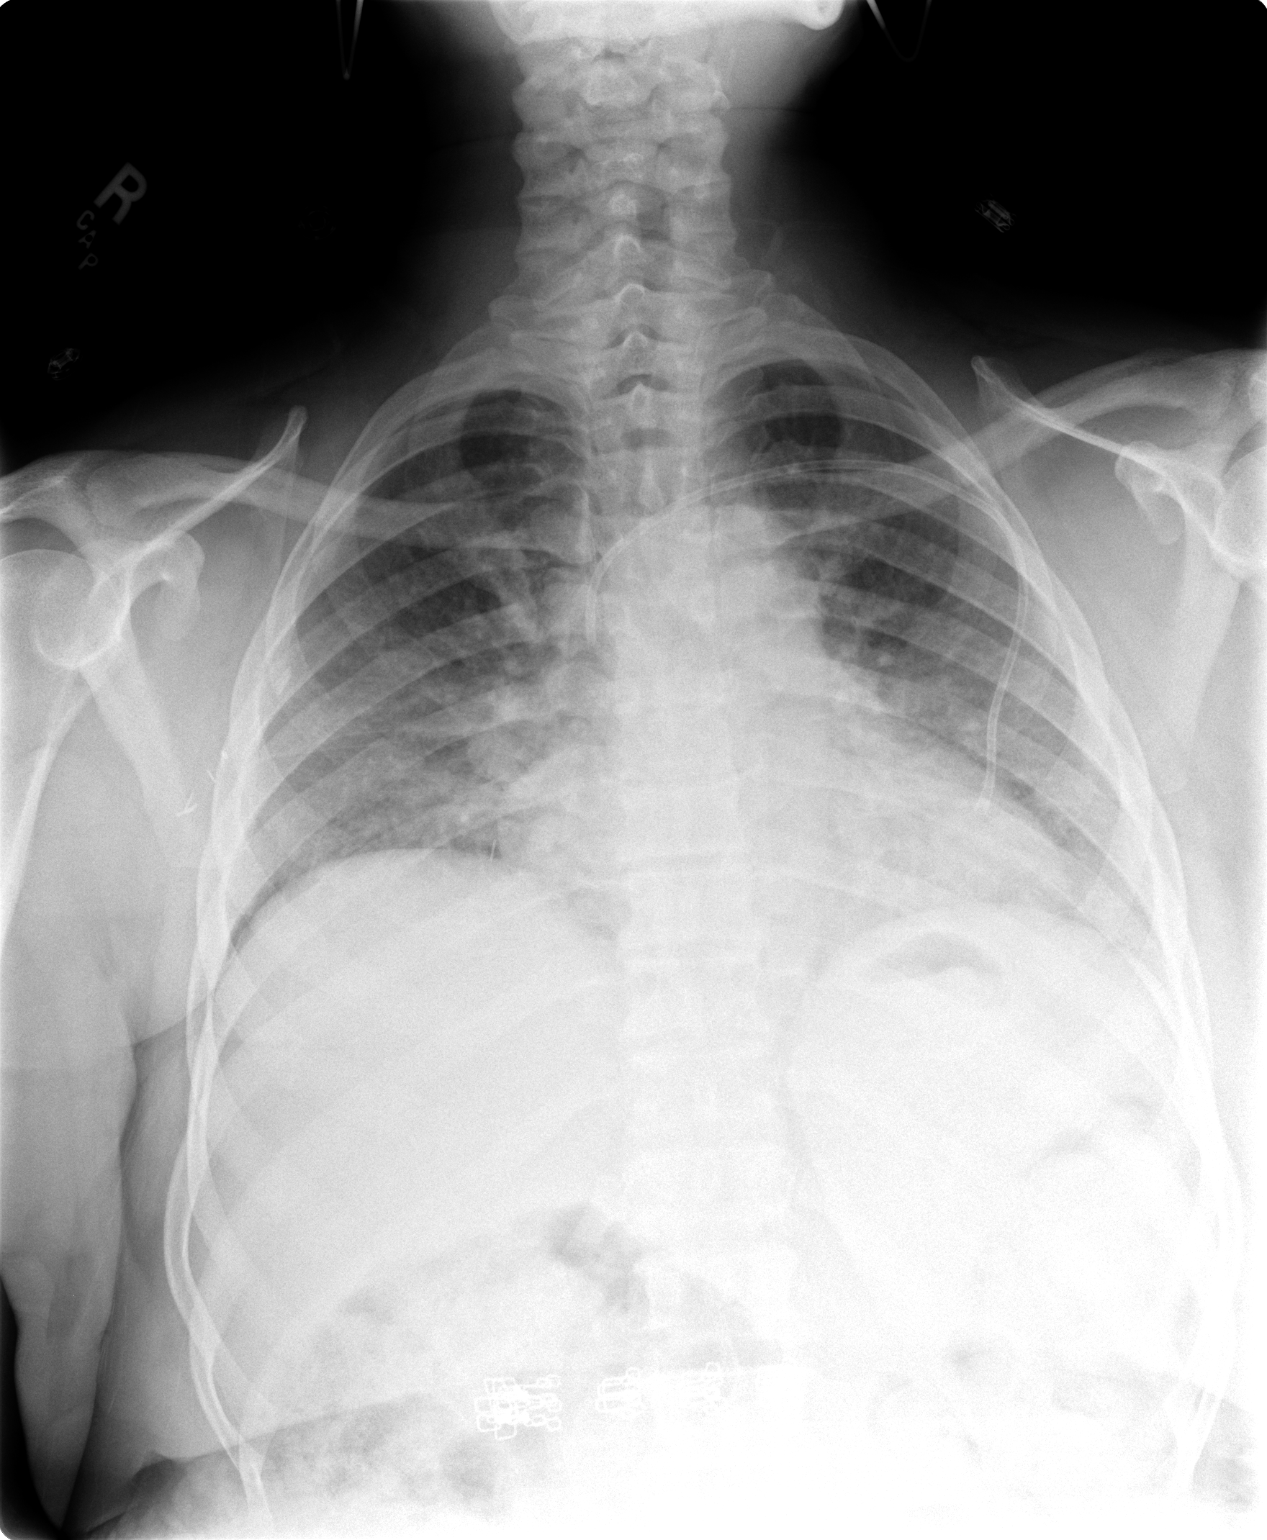

[view not recorded (2 of 2)]
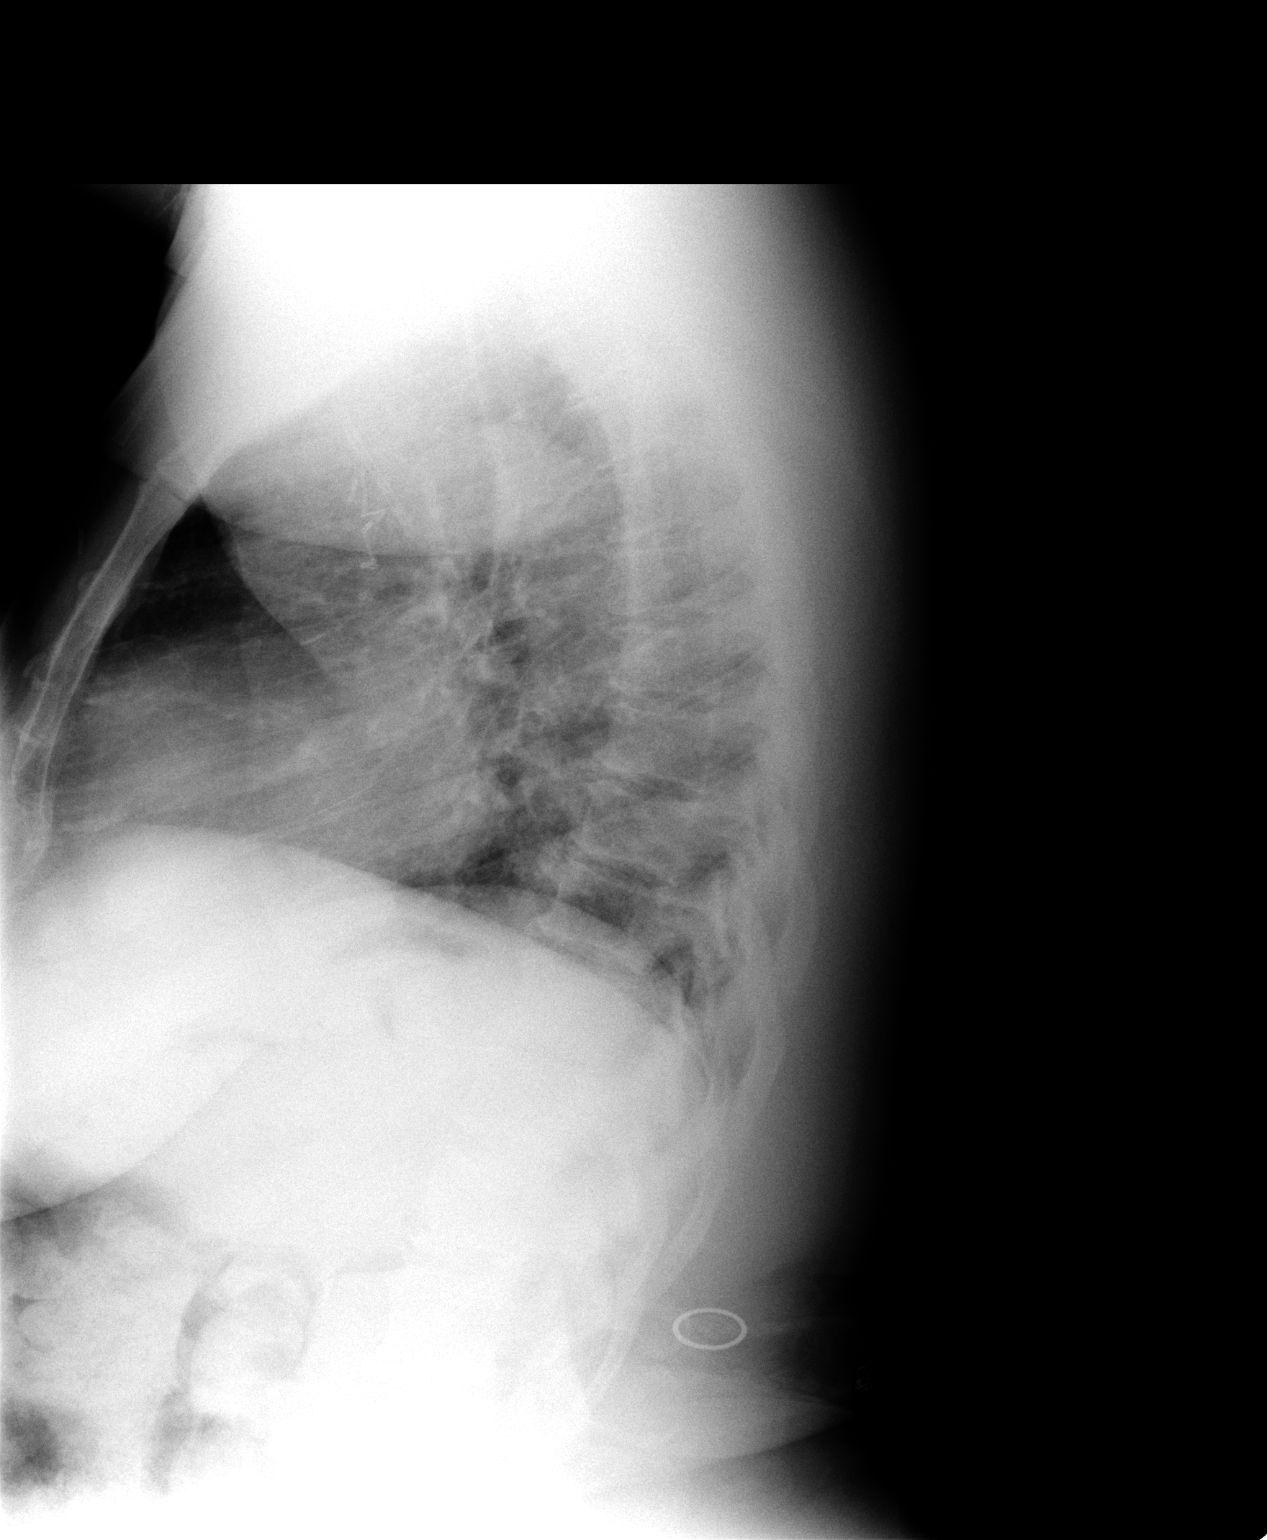

[2 of 2 positions shown; findings below may reference images not displayed]

Films obtained during low inspiration.  Post surgical changes secondary to right mastectomy.  Air space process bilaterally, edema versus infection.  Associated right middle lobe subsegmental atelectasis.  Central catheter tips in the superior vena cava.
IMPRESSION: Bilateral air space process, edema versus infection.  Findings were discussed with Dr. Lobsang.

## 2005-05-06 ENCOUNTER — Ambulatory Visit: Payer: Self-pay | Admitting: Oncology

## 2005-05-11 ENCOUNTER — Ambulatory Visit (HOSPITAL_COMMUNITY): Admission: RE | Admit: 2005-05-11 | Discharge: 2005-05-11 | Payer: Self-pay | Admitting: Oncology

## 2005-05-12 ENCOUNTER — Ambulatory Visit (HOSPITAL_COMMUNITY): Admission: RE | Admit: 2005-05-12 | Discharge: 2005-05-12 | Payer: Self-pay | Admitting: Oncology

## 2005-05-26 ENCOUNTER — Ambulatory Visit (HOSPITAL_COMMUNITY): Admission: RE | Admit: 2005-05-26 | Discharge: 2005-05-26 | Payer: Self-pay | Admitting: Oncology

## 2005-06-09 ENCOUNTER — Ambulatory Visit (HOSPITAL_BASED_OUTPATIENT_CLINIC_OR_DEPARTMENT_OTHER): Admission: RE | Admit: 2005-06-09 | Discharge: 2005-06-09 | Payer: Self-pay | Admitting: General Surgery

## 2005-08-11 ENCOUNTER — Ambulatory Visit: Payer: Self-pay | Admitting: Oncology

## 2005-08-20 ENCOUNTER — Emergency Department (HOSPITAL_COMMUNITY): Admission: EM | Admit: 2005-08-20 | Discharge: 2005-08-20 | Payer: Self-pay | Admitting: Family Medicine

## 2005-08-25 ENCOUNTER — Ambulatory Visit: Payer: Self-pay | Admitting: Oncology

## 2005-09-01 ENCOUNTER — Emergency Department (HOSPITAL_COMMUNITY): Admission: EM | Admit: 2005-09-01 | Discharge: 2005-09-01 | Payer: Self-pay | Admitting: Family Medicine

## 2005-09-24 IMAGING — CT CT ABDOMEN W/ CM
1 of 3 series · 14 of 32 positions shown, 19 images · IV contrast (omnipaque)
Comparison: The current study is compared with the prior exam dated 09/21/04.
COMPARISON: 09/21/04.

CLINICAL DATA: Carcinoma of the breast.  
Reaction:  The patient gave a history that she had hives after prior CT scans, but was not treated for these since she did not report them at those times.  It was decided that the patient needed no premedication prior to the study.  She did develop mild hives following the study and was given 50 mg of Benadryl p.o. following the scan.  The patient had no significant reaction.
CHEST CT WITH CONTRAST:
TECHNIQUE: Multidetector CT imaging of the chest was performed following the standard protocol during bolus administration of intravenous contrast.
Contrast:  150 cc Omnipaque 300.
TECHNIQUE: Multidetector CT imaging of the abdomen was performed following the standard protocol during bolus administration of intravenous contrast.

[Series 2: ca w/iv 5.0 b30f · axial · 0.74mm/px · z∈[-417,-57]mm · 14 of 84 slices shown, 19 images]
[im 6/84  soft-tissue]
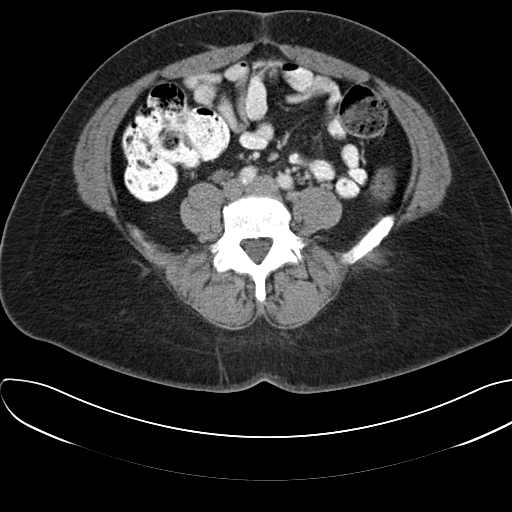
[im 6/84  bone]
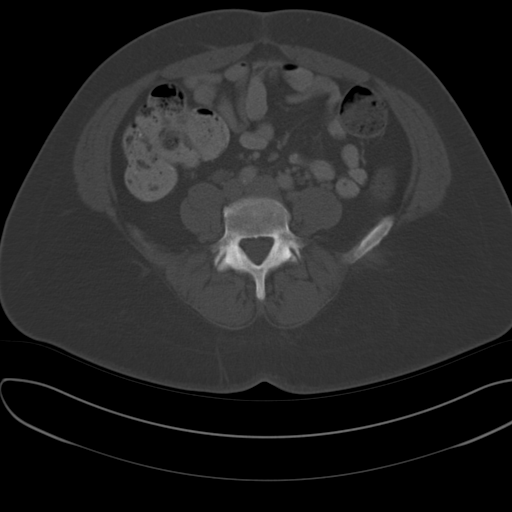
[im 11/84  soft-tissue]
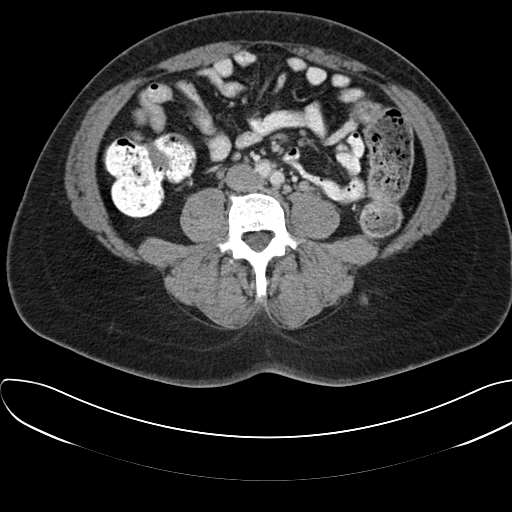
[im 16/84  soft-tissue]
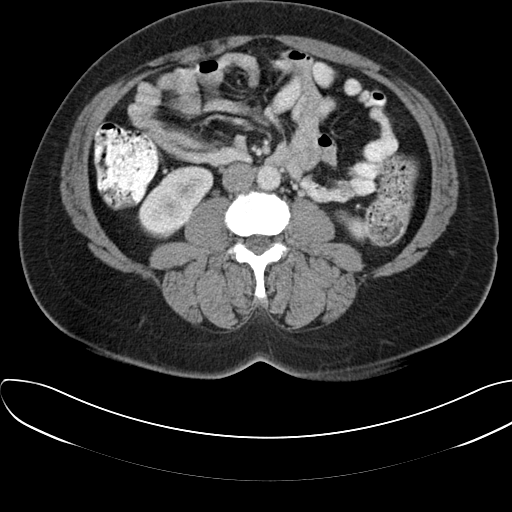
[im 26/84  soft-tissue]
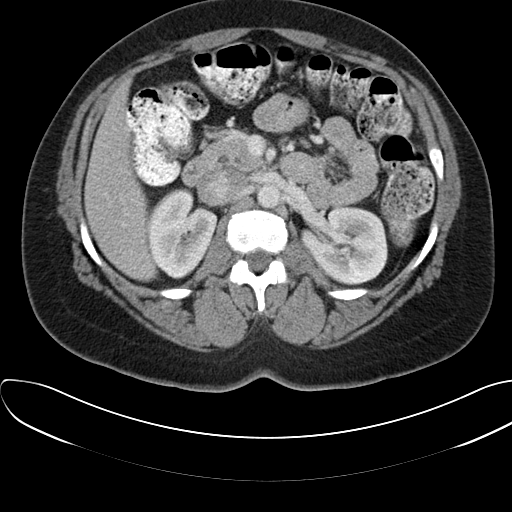
[im 32/84  soft-tissue]
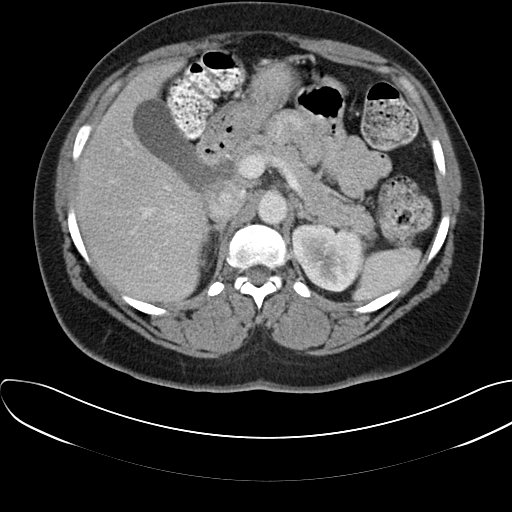
[im 37/84  soft-tissue]
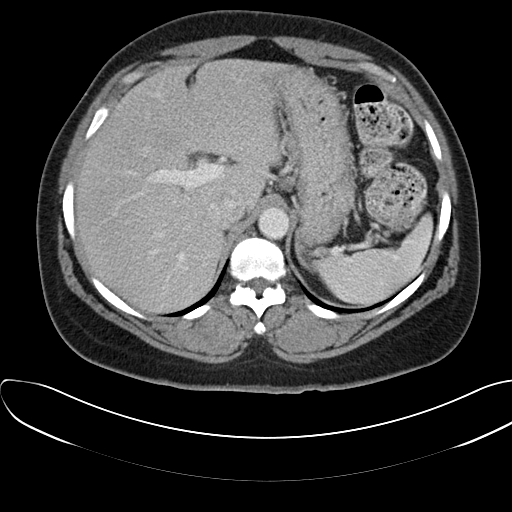
[im 42/84  soft-tissue]
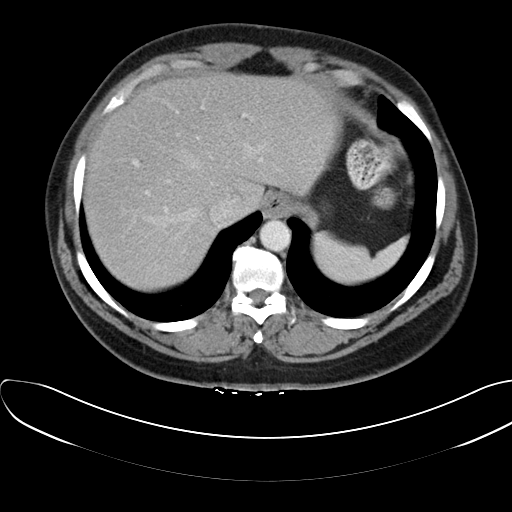
[im 47/84  soft-tissue]
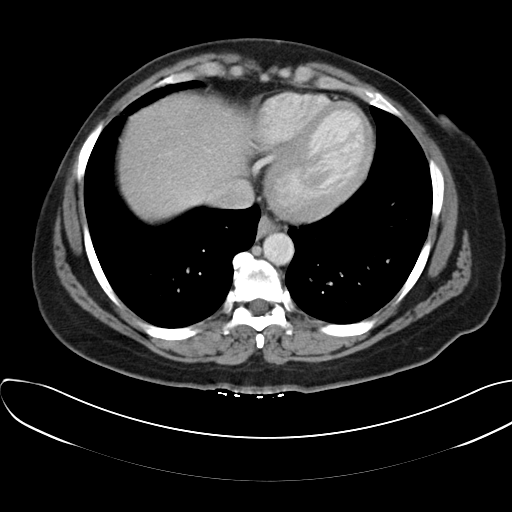
[im 52/84  soft-tissue]
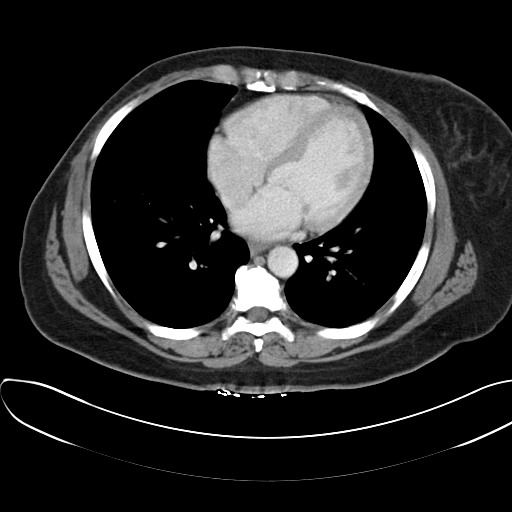
[im 52/84  bone]
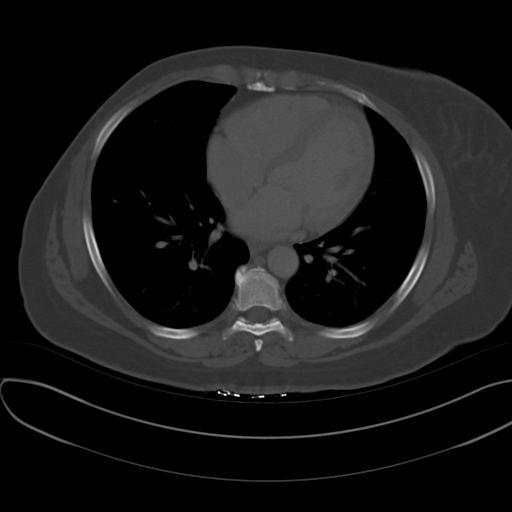
[im 58/84  soft-tissue]
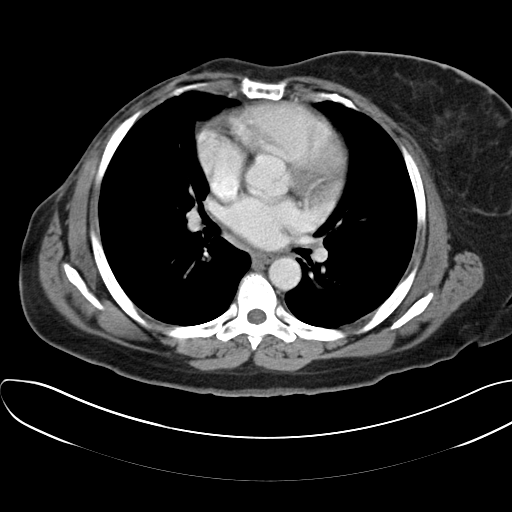
[im 63/84  lung]
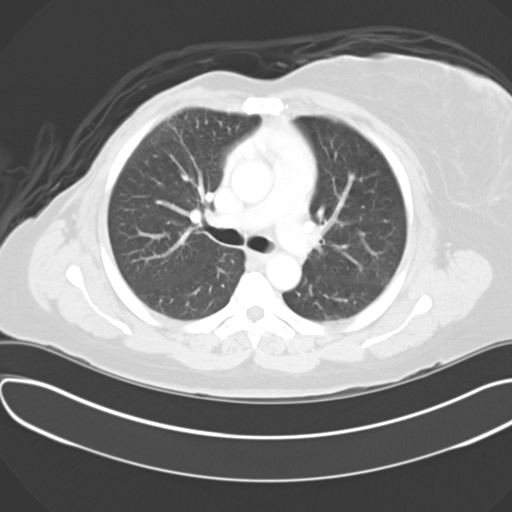
[im 68/84  soft-tissue]
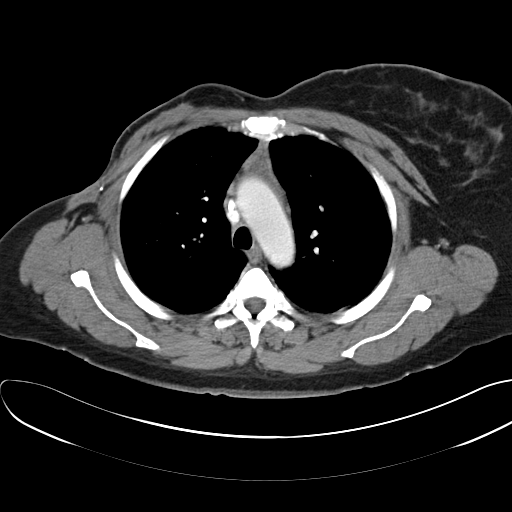
[im 68/84  lung]
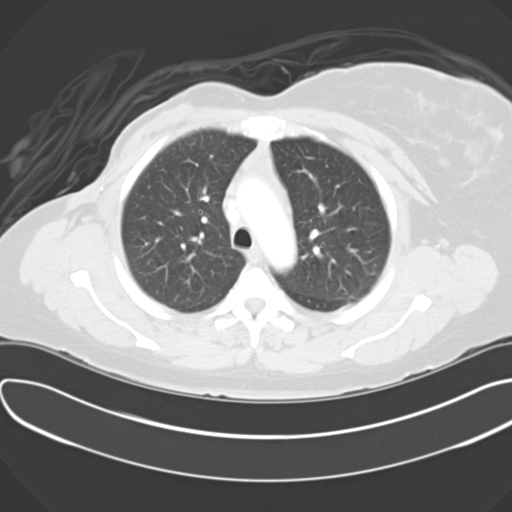
[im 73/84  soft-tissue]
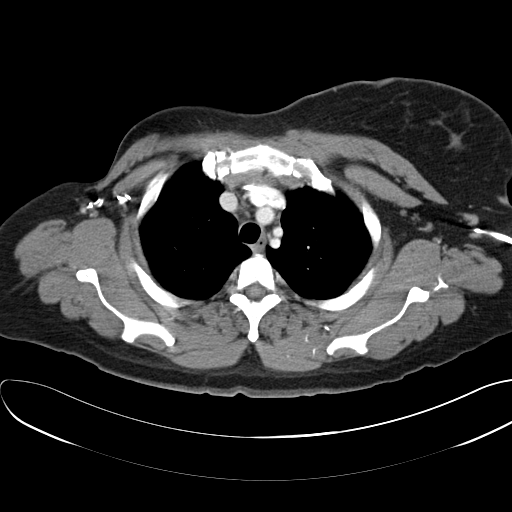
[im 73/84  lung]
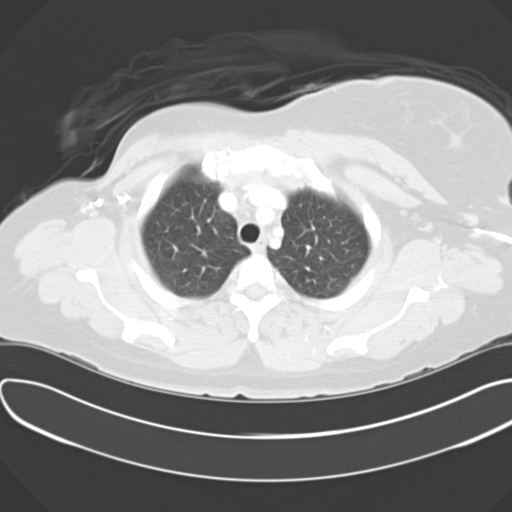
[im 78/84  soft-tissue]
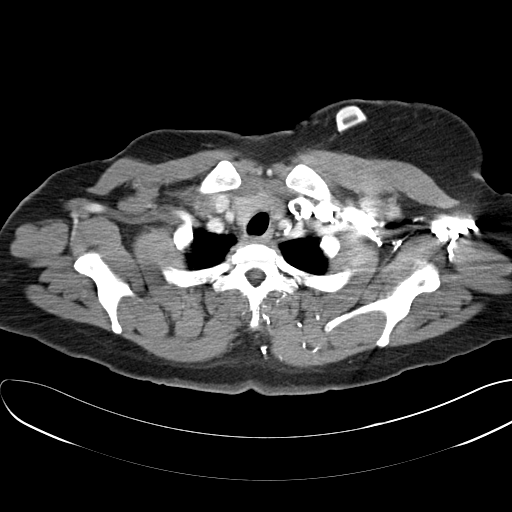
[im 78/84  lung]
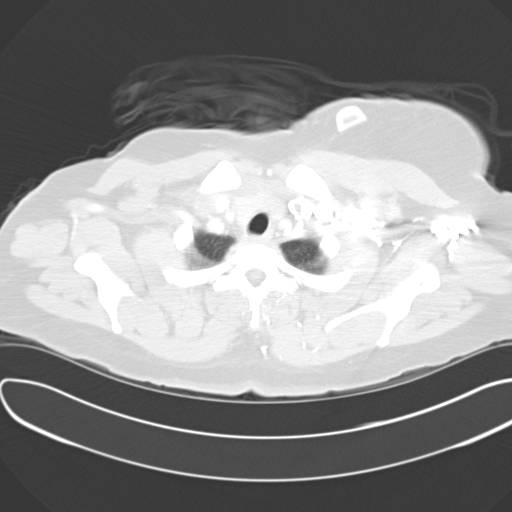

[14 of 32 positions shown; findings below may reference images not displayed]

There is less prominent scarring in the right axilla from the previous mastectomy and node dissection.  Heart size and vascularity are normal, and the lungs are clear except for a tiny vague density at the right base posteromedially which measures approximately 5 mm in size and with benefit of retrospection is unchanged since the prior study and not felt to be significant.  The left lung is clear.  The heart size and bony structures demonstrate no significant abnormality.
IMPRESSION: No significant abnormality of the chest. 
ABDOMEN CT WITH CONTRAST:
The liver, spleen, pancreas, adrenal glands, and kidneys appear normal.  No significant bony abnormality.  No adenopathy.
IMPRESSION: Benign-appearing abdomen.

## 2005-09-25 IMAGING — NM NM CARDIA MUGA REST
6 series · 36 of 36 positions shown · non-contrast
Comparison: none

CLINICAL DATA: Patient on chemotherapy for breast cancer.  
 REST MUGA RADIONUCLIDE STUDY:  
 Previous rest MUGA study on 11/24/2004 revealed an ejection fraction of 51%.

[Series 1: mu muga · 4.34mm/px · 6 of 16 frames shown (1 of 6)]
[frame 2/16]
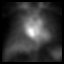
[frame 4/16]
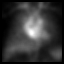
[frame 7/16]
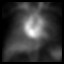
[frame 10/16]
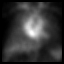
[frame 12/16]
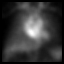
[frame 15/16]
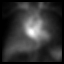

[Series 1: mu muga · 4.34mm/px · 6 of 16 frames shown (2 of 6)]
[frame 2/16]
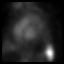
[frame 4/16]
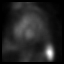
[frame 7/16]
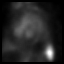
[frame 10/16]
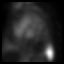
[frame 12/16]
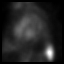
[frame 15/16]
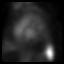

[Series 1: mu muga · 4.34mm/px · 6 of 16 frames shown (3 of 6)]
[frame 2/16]
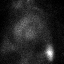
[frame 4/16]
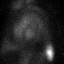
[frame 7/16]
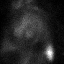
[frame 10/16]
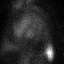
[frame 12/16]
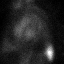
[frame 15/16]
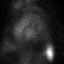

[Series 1: mu muga · 4.34mm/px · 6 of 16 frames shown (4 of 6)]
[frame 2/16]
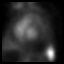
[frame 4/16]
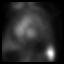
[frame 7/16]
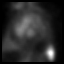
[frame 10/16]
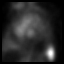
[frame 12/16]
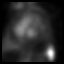
[frame 15/16]
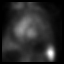

[Series 1: mu muga · 4.34mm/px · 6 of 16 frames shown (5 of 6)]
[frame 2/16]
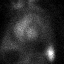
[frame 4/16]
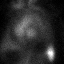
[frame 7/16]
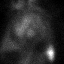
[frame 10/16]
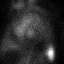
[frame 12/16]
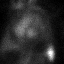
[frame 15/16]
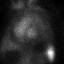

[Series 1: mu muga · 4.34mm/px · 6 of 16 frames shown (6 of 6)]
[frame 2/16]
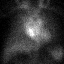
[frame 4/16]
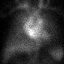
[frame 7/16]
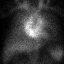
[frame 10/16]
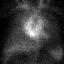
[frame 12/16]
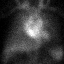
[frame 15/16]
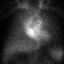

[36 of 36 positions shown; findings below may reference images not displayed]

FINDINGS: First calculated ejection fraction is 59.1% and the second 60%.  Wall motion is within normal limits.
IMPRESSION: Ejection fraction is now calculated to be 59.5% as compared to 51% on the previous study.

## 2005-10-19 ENCOUNTER — Other Ambulatory Visit: Admission: RE | Admit: 2005-10-19 | Discharge: 2005-10-19 | Payer: Self-pay | Admitting: General Surgery

## 2005-10-26 ENCOUNTER — Ambulatory Visit: Payer: Self-pay | Admitting: Oncology

## 2005-11-01 ENCOUNTER — Ambulatory Visit (HOSPITAL_COMMUNITY): Admission: RE | Admit: 2005-11-01 | Discharge: 2005-11-01 | Payer: Self-pay | Admitting: Oncology

## 2005-11-09 ENCOUNTER — Ambulatory Visit (HOSPITAL_COMMUNITY): Admission: RE | Admit: 2005-11-09 | Discharge: 2005-11-09 | Payer: Self-pay | Admitting: Oncology

## 2005-11-10 ENCOUNTER — Ambulatory Visit: Admission: RE | Admit: 2005-11-10 | Discharge: 2006-01-04 | Payer: Self-pay | Admitting: *Deleted

## 2005-11-17 ENCOUNTER — Ambulatory Visit (HOSPITAL_COMMUNITY): Admission: RE | Admit: 2005-11-17 | Discharge: 2005-11-17 | Payer: Self-pay | Admitting: *Deleted

## 2005-12-12 ENCOUNTER — Ambulatory Visit: Payer: Self-pay | Admitting: Oncology

## 2005-12-14 ENCOUNTER — Encounter: Admission: RE | Admit: 2005-12-14 | Discharge: 2005-12-14 | Payer: Self-pay | Admitting: Oncology

## 2006-01-18 ENCOUNTER — Emergency Department (HOSPITAL_COMMUNITY): Admission: EM | Admit: 2006-01-18 | Discharge: 2006-01-18 | Payer: Self-pay | Admitting: Emergency Medicine

## 2006-01-27 ENCOUNTER — Ambulatory Visit: Payer: Self-pay | Admitting: Oncology

## 2006-02-01 ENCOUNTER — Ambulatory Visit (HOSPITAL_COMMUNITY): Admission: RE | Admit: 2006-02-01 | Discharge: 2006-02-01 | Payer: Self-pay | Admitting: Oncology

## 2006-02-03 ENCOUNTER — Encounter: Admission: RE | Admit: 2006-02-03 | Discharge: 2006-02-03 | Payer: Self-pay | Admitting: Oncology

## 2006-02-03 ENCOUNTER — Encounter (INDEPENDENT_AMBULATORY_CARE_PROVIDER_SITE_OTHER): Payer: Self-pay | Admitting: Specialist

## 2006-02-03 ENCOUNTER — Encounter (INDEPENDENT_AMBULATORY_CARE_PROVIDER_SITE_OTHER): Payer: Self-pay | Admitting: Diagnostic Radiology

## 2006-02-10 LAB — CBC WITH DIFFERENTIAL/PLATELET
BASO%: 2.5 % — ABNORMAL HIGH (ref 0.0–2.0)
Basophils Absolute: 0.1 10*3/uL (ref 0.0–0.1)
EOS%: 2.9 % (ref 0.0–7.0)
Eosinophils Absolute: 0.2 10*3/uL (ref 0.0–0.5)
HCT: 37.9 % (ref 34.8–46.6)
HGB: 12.4 g/dL (ref 11.6–15.9)
LYMPH%: 35 % (ref 14.0–48.0)
MCH: 28.9 pg (ref 26.0–34.0)
MCHC: 32.8 g/dL (ref 32.0–36.0)
MCV: 88.1 fL (ref 81.0–101.0)
MONO#: 0.8 10*3/uL (ref 0.1–0.9)
MONO%: 15.7 % — ABNORMAL HIGH (ref 0.0–13.0)
NEUT#: 2.3 10*3/uL (ref 1.5–6.5)
NEUT%: 43.9 % (ref 39.6–76.8)
Platelets: 354 10*3/uL (ref 145–400)
RBC: 4.3 10*6/uL (ref 3.70–5.32)
RDW: 14.4 % (ref 11.3–14.5)
WBC: 5.3 10*3/uL (ref 3.9–10.0)
lymph#: 1.9 10*3/uL (ref 0.9–3.3)

## 2006-02-12 ENCOUNTER — Emergency Department (HOSPITAL_COMMUNITY): Admission: AD | Admit: 2006-02-12 | Discharge: 2006-02-12 | Payer: Self-pay | Admitting: Family Medicine

## 2006-02-13 ENCOUNTER — Ambulatory Visit (HOSPITAL_COMMUNITY): Admission: RE | Admit: 2006-02-13 | Discharge: 2006-02-14 | Payer: Self-pay | Admitting: General Surgery

## 2006-02-13 ENCOUNTER — Encounter (INDEPENDENT_AMBULATORY_CARE_PROVIDER_SITE_OTHER): Payer: Self-pay | Admitting: Specialist

## 2006-02-24 LAB — CBC WITH DIFFERENTIAL/PLATELET
BASO%: 1.3 % (ref 0.0–2.0)
Basophils Absolute: 0.1 10*3/uL (ref 0.0–0.1)
EOS%: 4.3 % (ref 0.0–7.0)
Eosinophils Absolute: 0.3 10*3/uL (ref 0.0–0.5)
HCT: 36.1 % (ref 34.8–46.6)
HGB: 12 g/dL (ref 11.6–15.9)
LYMPH%: 21.9 % (ref 14.0–48.0)
MCH: 29.5 pg (ref 26.0–34.0)
MCHC: 33.3 g/dL (ref 32.0–36.0)
MCV: 88.4 fL (ref 81.0–101.0)
MONO#: 0.6 10*3/uL (ref 0.1–0.9)
MONO%: 8 % (ref 0.0–13.0)
NEUT#: 5 10*3/uL (ref 1.5–6.5)
NEUT%: 64.6 % (ref 39.6–76.8)
Platelets: 255 10*3/uL (ref 145–400)
RBC: 4.09 10*6/uL (ref 3.70–5.32)
RDW: 13.9 % (ref 11.3–14.5)
WBC: 7.7 10*3/uL (ref 3.9–10.0)
lymph#: 1.7 10*3/uL (ref 0.9–3.3)

## 2006-03-03 LAB — CBC WITH DIFFERENTIAL/PLATELET
BASO%: 1.1 % (ref 0.0–2.0)
Basophils Absolute: 0.1 10*3/uL (ref 0.0–0.1)
EOS%: 3.9 % (ref 0.0–7.0)
Eosinophils Absolute: 0.2 10*3/uL (ref 0.0–0.5)
HCT: 34.5 % — ABNORMAL LOW (ref 34.8–46.6)
HGB: 11.1 g/dL — ABNORMAL LOW (ref 11.6–15.9)
LYMPH%: 25 % (ref 14.0–48.0)
MCH: 28.4 pg (ref 26.0–34.0)
MCHC: 32.1 g/dL (ref 32.0–36.0)
MCV: 88.5 fL (ref 81.0–101.0)
MONO#: 0.4 10*3/uL (ref 0.1–0.9)
MONO%: 7.2 % (ref 0.0–13.0)
NEUT#: 3.9 10*3/uL (ref 1.5–6.5)
NEUT%: 62.9 % (ref 39.6–76.8)
Platelets: 339 10*3/uL (ref 145–400)
RBC: 3.9 10*6/uL (ref 3.70–5.32)
RDW: 13.8 % (ref 11.3–14.5)
WBC: 6.1 10*3/uL (ref 3.9–10.0)
lymph#: 1.5 10*3/uL (ref 0.9–3.3)

## 2006-03-08 ENCOUNTER — Ambulatory Visit (HOSPITAL_COMMUNITY): Admission: RE | Admit: 2006-03-08 | Discharge: 2006-03-08 | Payer: Self-pay | Admitting: Oncology

## 2006-03-22 ENCOUNTER — Ambulatory Visit: Payer: Self-pay | Admitting: Oncology

## 2006-03-24 LAB — CBC WITH DIFFERENTIAL/PLATELET
BASO%: 2.3 % — ABNORMAL HIGH (ref 0.0–2.0)
Basophils Absolute: 0.1 10*3/uL (ref 0.0–0.1)
EOS%: 2.3 % (ref 0.0–7.0)
Eosinophils Absolute: 0.1 10*3/uL (ref 0.0–0.5)
HCT: 37.3 % (ref 34.8–46.6)
HGB: 12.2 g/dL (ref 11.6–15.9)
LYMPH%: 28.6 % (ref 14.0–48.0)
MCH: 29.6 pg (ref 26.0–34.0)
MCHC: 32.6 g/dL (ref 32.0–36.0)
MCV: 90.8 fL (ref 81.0–101.0)
MONO#: 0.7 10*3/uL (ref 0.1–0.9)
MONO%: 11.8 % (ref 0.0–13.0)
NEUT#: 3.2 10*3/uL (ref 1.5–6.5)
NEUT%: 55 % (ref 39.6–76.8)
Platelets: 240 10*3/uL (ref 145–400)
RBC: 4.11 10*6/uL (ref 3.70–5.32)
RDW: 13.5 % (ref 11.3–14.5)
WBC: 5.9 10*3/uL (ref 3.9–10.0)
lymph#: 1.7 10*3/uL (ref 0.9–3.3)

## 2006-03-24 LAB — COMPREHENSIVE METABOLIC PANEL
ALT: <8 U/L (ref 0–40)
AST: 18 U/L (ref 0–37)
Albumin: 4.3 g/dL (ref 3.5–5.2)
Alkaline Phosphatase: 104 U/L (ref 39–117)
BUN: 14 mg/dL (ref 6–23)
CO2: 23 mEq/L (ref 19–32)
Calcium: 8.8 mg/dL (ref 8.4–10.5)
Chloride: 104 mEq/L (ref 96–112)
Creatinine, Ser: 1 mg/dL (ref 0.4–1.2)
Glucose, Bld: 101 mg/dL — ABNORMAL HIGH (ref 70–99)
Potassium: 3.7 mEq/L (ref 3.5–5.3)
Sodium: 138 mEq/L (ref 135–145)
Total Bilirubin: 0.2 mg/dL — ABNORMAL LOW (ref 0.3–1.2)
Total Protein: 7 g/dL (ref 6.0–8.3)

## 2006-03-24 LAB — LACTATE DEHYDROGENASE: LDH: 159 U/L (ref 94–250)

## 2006-03-24 LAB — CEA: CEA: 1.1 ng/mL (ref 0.0–5.0)

## 2006-03-24 LAB — CANCER ANTIGEN 27.29: CA 27.29: 24 U/mL (ref 0–39)

## 2006-03-31 LAB — COMPREHENSIVE METABOLIC PANEL
ALT: 33 U/L (ref 0–40)
AST: 32 U/L (ref 0–37)
Albumin: 4.3 g/dL (ref 3.5–5.2)
Alkaline Phosphatase: 98 U/L (ref 39–117)
BUN: 16 mg/dL (ref 6–23)
CO2: 25 mEq/L (ref 19–32)
Calcium: 9.7 mg/dL (ref 8.4–10.5)
Chloride: 103 mEq/L (ref 96–112)
Creatinine, Ser: 1 mg/dL (ref 0.4–1.2)
Glucose, Bld: 94 mg/dL (ref 70–99)
Potassium: 4 mEq/L (ref 3.5–5.3)
Sodium: 137 mEq/L (ref 135–145)
Total Bilirubin: 0.4 mg/dL (ref 0.3–1.2)
Total Protein: 7.3 g/dL (ref 6.0–8.3)

## 2006-03-31 LAB — CBC WITH DIFFERENTIAL/PLATELET
BASO%: 1.6 % (ref 0.0–2.0)
Basophils Absolute: 0.1 10*3/uL (ref 0.0–0.1)
EOS%: 2.2 % (ref 0.0–7.0)
Eosinophils Absolute: 0.1 10*3/uL (ref 0.0–0.5)
HCT: 39.9 % (ref 34.8–46.6)
HGB: 12.8 g/dL (ref 11.6–15.9)
LYMPH%: 19.8 % (ref 14.0–48.0)
MCH: 28.8 pg (ref 26.0–34.0)
MCHC: 32.1 g/dL (ref 32.0–36.0)
MCV: 89.8 fL (ref 81.0–101.0)
MONO#: 0.3 10*3/uL (ref 0.1–0.9)
MONO%: 5.7 % (ref 0.0–13.0)
NEUT#: 3.7 10*3/uL (ref 1.5–6.5)
NEUT%: 70.7 % (ref 39.6–76.8)
Platelets: 265 10*3/uL (ref 145–400)
RBC: 4.45 10*6/uL (ref 3.70–5.32)
RDW: 13.5 % (ref 11.3–14.5)
WBC: 5.2 10*3/uL (ref 3.9–10.0)
lymph#: 1 10*3/uL (ref 0.9–3.3)

## 2006-04-02 IMAGING — PT NM PET TUM IMG SKULL BASE T - THIGH
8 series · 25 of 25 positions shown · non-contrast
Comparison: 03/09/04.

CLINICAL DATA: Followup metastatic breast cancer.  Currently undergoing chemotherapy.  Restaging.  Borderline cervical and mediastinal adenopathy seen on recent CT. 
FDG PET-CT TUMOR IMAGING (SKULL BASE TO THIGHS):
Fasting Blood Glucose:  115
TECHNIQUE: 16.1 mCi F-18 FDG were administered via the left hand.  Full ring PET imaging was performed from the skull base through the mid-thighs 55 minutes after injection.  CT data was obtained and used for attenuation correction and anatomic localization only.  (This was not acquired as a diagnostic CT examination.)

[Series 1: pet ac · axial · 3.3mm · 4.69mm/px · z∈[-873,-3]mm · 5 of 267 slices shown (1 of 2)]
[im 1/267]
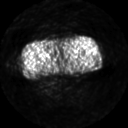
[im 67/267]
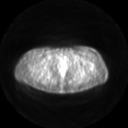
[im 134/267]
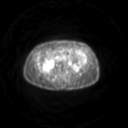
[im 200/267]
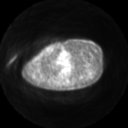
[im 267/267]
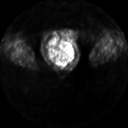

[Series 1: pet ac · axial · 3.3mm · 4.69mm/px · z∈[-298,-3]mm · 2 of 91 slices shown (2 of 2)]
[im 1/91]
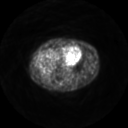
[im 91/91]
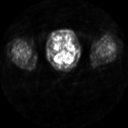

[Series 2: ct images · axial · 3.8mm · 0.98mm/px · z∈[-873,-4]mm · 6 of 267 slices shown (1 of 2)]
[im 1/267]
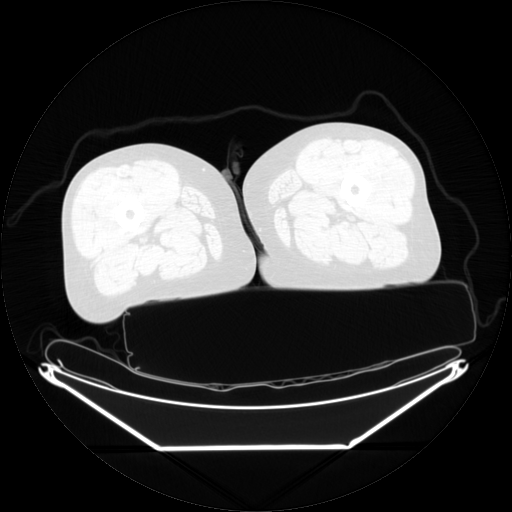
[im 54/267]
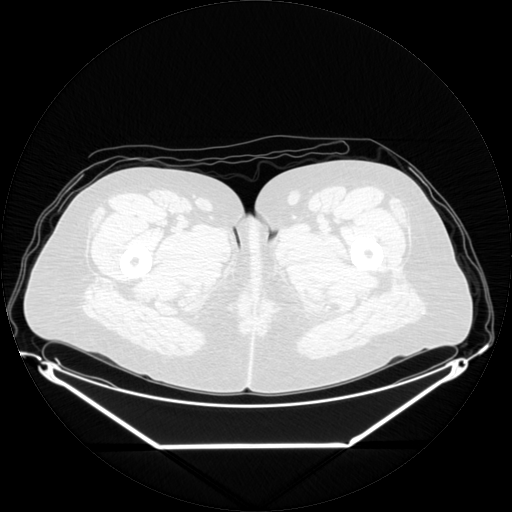
[im 107/267]
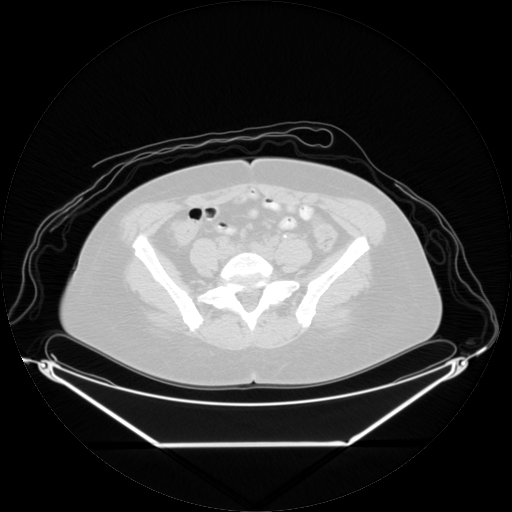
[im 160/267]
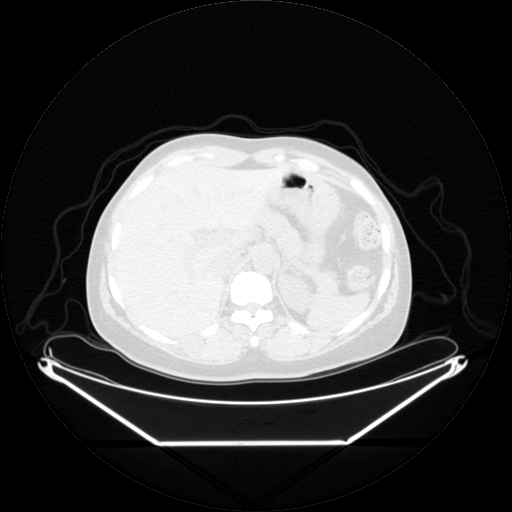
[im 213/267]
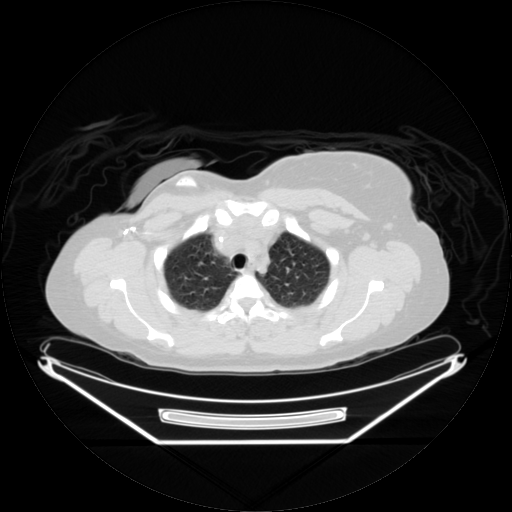
[im 267/267]
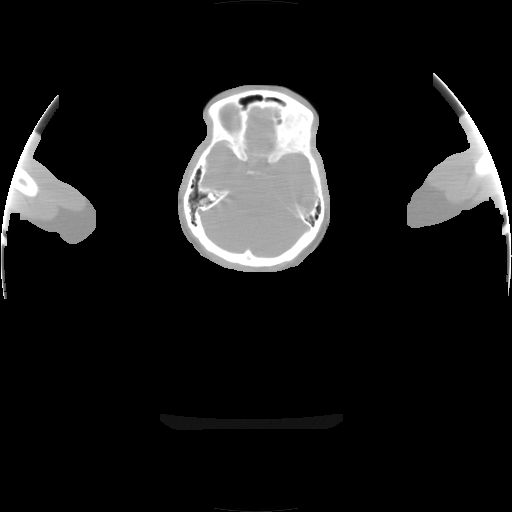

[Series 2: pet nac · axial · 3.3mm · 4.69mm/px · z∈[-298,-3]mm · 2 of 91 slices shown (1 of 2)]
[im 1/91]
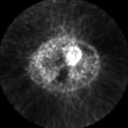
[im 91/91]
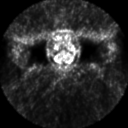

[Series 2: pet nac · axial · 3.3mm · 4.69mm/px · z∈[-873,-3]mm · 6 of 267 slices shown (2 of 2)]
[im 1/267]
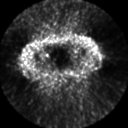
[im 54/267]
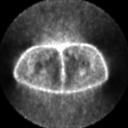
[im 107/267]
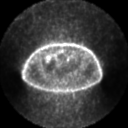
[im 160/267]
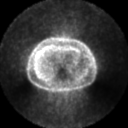
[im 213/267]
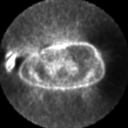
[im 267/267]
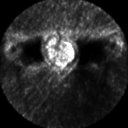

[Series 4: ct images · axial · 3.8mm · 0.98mm/px · z∈[-298,-4]mm · 2 of 91 slices shown (2 of 2)]
[im 1/91]
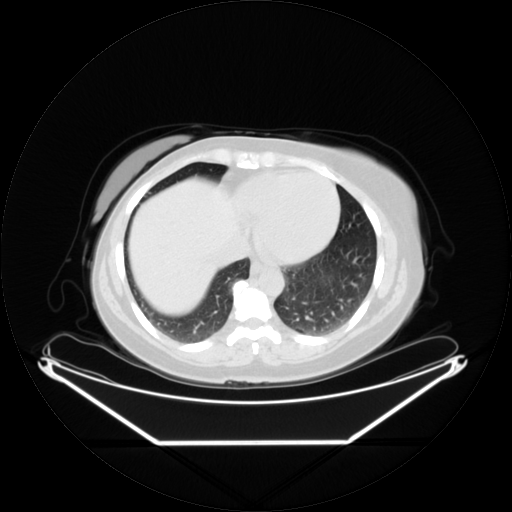
[im 91/91]
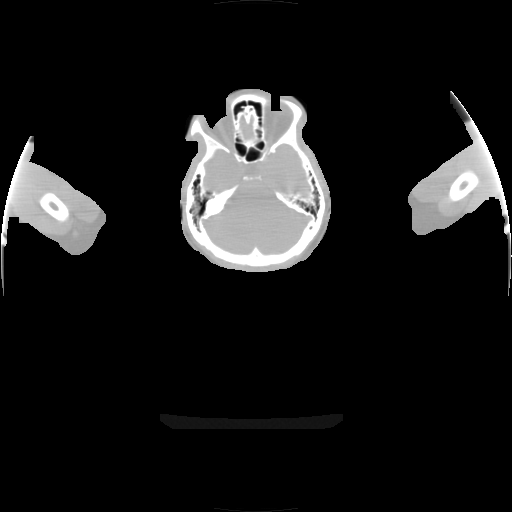

[Series 123: mip · coronal · 3.3mm · 4.69mm/px · 1 of 30 slices shown (1 of 2)]
[im 1/30]
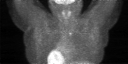

[Series 123: mip · coronal · 3.3mm · 4.69mm/px · 1 of 30 slices shown (2 of 2)]
[im 1/30]
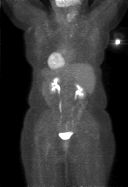

[25 of 25 positions shown; findings below may reference images not displayed]

FINDINGS: Abnormal FDG uptake is seen within small mediastinal lymph nodes within the high right paratracheal region which has an SUV of 3.3.  This is new since previous PET CT on 03/09/04 and consistent with lymph node metastasis.  No other sites of abnormal FDG uptake are identified within the abdomen or pelvis.  No abnormal sites of FDG uptake are identified within the neck.
IMPRESSION: 1.  FDG uptake within new high right paratracheal mediastinal lymph node, consistent with metastatic disease.  
2.  No other sites of abnormal FDG uptake identified.

## 2006-04-16 ENCOUNTER — Emergency Department (HOSPITAL_COMMUNITY): Admission: EM | Admit: 2006-04-16 | Discharge: 2006-04-16 | Payer: Self-pay | Admitting: Family Medicine

## 2006-04-25 LAB — CBC WITH DIFFERENTIAL/PLATELET
BASO%: 0.7 % (ref 0.0–2.0)
Basophils Absolute: 0.1 10*3/uL (ref 0.0–0.1)
EOS%: 0.3 % (ref 0.0–7.0)
Eosinophils Absolute: 0 10*3/uL (ref 0.0–0.5)
HCT: 41 % (ref 34.8–46.6)
HGB: 13.5 g/dL (ref 11.6–15.9)
LYMPH%: 21.5 % (ref 14.0–48.0)
MCH: 28.9 pg (ref 26.0–34.0)
MCHC: 32.9 g/dL (ref 32.0–36.0)
MCV: 87.9 fL (ref 81.0–101.0)
MONO#: 0.8 10*3/uL (ref 0.1–0.9)
MONO%: 10 % (ref 0.0–13.0)
NEUT#: 5.1 10*3/uL (ref 1.5–6.5)
NEUT%: 67.6 % (ref 39.6–76.8)
Platelets: 247 10*3/uL (ref 145–400)
RBC: 4.67 10*6/uL (ref 3.70–5.32)
RDW: 12.7 % (ref 11.3–14.5)
WBC: 7.6 10*3/uL (ref 3.9–10.0)
lymph#: 1.6 10*3/uL (ref 0.9–3.3)

## 2006-04-25 LAB — COMPREHENSIVE METABOLIC PANEL
ALT: 17 U/L (ref 0–40)
AST: 20 U/L (ref 0–37)
Albumin: 4.3 g/dL (ref 3.5–5.2)
Alkaline Phosphatase: 83 U/L (ref 39–117)
BUN: 16 mg/dL (ref 6–23)
CO2: 27 mEq/L (ref 19–32)
Calcium: 9.5 mg/dL (ref 8.4–10.5)
Chloride: 102 mEq/L (ref 96–112)
Creatinine, Ser: 1.2 mg/dL (ref 0.40–1.20)
Glucose, Bld: 97 mg/dL (ref 70–99)
Potassium: 4.1 mEq/L (ref 3.5–5.3)
Sodium: 141 mEq/L (ref 135–145)
Total Bilirubin: 0.3 mg/dL (ref 0.3–1.2)
Total Protein: 7.2 g/dL (ref 6.0–8.3)

## 2006-04-25 LAB — CANCER ANTIGEN 27.29: CA 27.29: 22 U/mL (ref 0–39)

## 2006-05-03 ENCOUNTER — Ambulatory Visit (HOSPITAL_COMMUNITY): Admission: RE | Admit: 2006-05-03 | Discharge: 2006-05-03 | Payer: Self-pay | Admitting: Oncology

## 2006-05-07 ENCOUNTER — Ambulatory Visit: Payer: Self-pay | Admitting: Oncology

## 2006-05-16 ENCOUNTER — Emergency Department (HOSPITAL_COMMUNITY): Admission: EM | Admit: 2006-05-16 | Discharge: 2006-05-16 | Payer: Self-pay | Admitting: Emergency Medicine

## 2006-05-16 LAB — CBC WITH DIFFERENTIAL/PLATELET
BASO%: 1.7 % (ref 0.0–2.0)
Basophils Absolute: 0.1 10*3/uL (ref 0.0–0.1)
EOS%: 5.1 % (ref 0.0–7.0)
Eosinophils Absolute: 0.3 10*3/uL (ref 0.0–0.5)
HCT: 38 % (ref 34.8–46.6)
HGB: 12.4 g/dL (ref 11.6–15.9)
LYMPH%: 22.4 % (ref 14.0–48.0)
MCH: 29.2 pg (ref 26.0–34.0)
MCHC: 32.5 g/dL (ref 32.0–36.0)
MCV: 89.7 fL (ref 81.0–101.0)
MONO#: 0.5 10*3/uL (ref 0.1–0.9)
MONO%: 9.3 % (ref 0.0–13.0)
NEUT#: 3.6 10*3/uL (ref 1.5–6.5)
NEUT%: 61.6 % (ref 39.6–76.8)
Platelets: 208 10*3/uL (ref 145–400)
RBC: 4.24 10*6/uL (ref 3.70–5.32)
RDW: 13.2 % (ref 11.3–14.5)
WBC: 5.9 10*3/uL (ref 3.9–10.0)
lymph#: 1.3 10*3/uL (ref 0.9–3.3)

## 2006-05-16 LAB — COMPREHENSIVE METABOLIC PANEL
ALT: 24 U/L (ref 0–40)
AST: 26 U/L (ref 0–37)
Albumin: 3.9 g/dL (ref 3.5–5.2)
Alkaline Phosphatase: 101 U/L (ref 39–117)
BUN: 17 mg/dL (ref 6–23)
CO2: 28 mEq/L (ref 19–32)
Calcium: 9.2 mg/dL (ref 8.4–10.5)
Chloride: 104 mEq/L (ref 96–112)
Creatinine, Ser: 0.91 mg/dL (ref 0.40–1.20)
Glucose, Bld: 94 mg/dL (ref 70–99)
Potassium: 4.2 mEq/L (ref 3.5–5.3)
Sodium: 142 mEq/L (ref 135–145)
Total Bilirubin: 0.2 mg/dL — ABNORMAL LOW (ref 0.3–1.2)
Total Protein: 6.4 g/dL (ref 6.0–8.3)

## 2006-05-16 LAB — CANCER ANTIGEN 27.29: CA 27.29: 25 U/mL (ref 0–39)

## 2006-06-13 LAB — CBC WITH DIFFERENTIAL/PLATELET
BASO%: 0.9 % (ref 0.0–2.0)
Basophils Absolute: 0.1 10*3/uL (ref 0.0–0.1)
EOS%: 4.2 % (ref 0.0–7.0)
Eosinophils Absolute: 0.3 10*3/uL (ref 0.0–0.5)
HCT: 40.2 % (ref 34.8–46.6)
HGB: 13.6 g/dL (ref 11.6–15.9)
LYMPH%: 28.7 % (ref 14.0–48.0)
MCH: 29.1 pg (ref 26.0–34.0)
MCHC: 33.7 g/dL (ref 32.0–36.0)
MCV: 86.2 fL (ref 81.0–101.0)
MONO#: 0.6 10*3/uL (ref 0.1–0.9)
MONO%: 9.2 % (ref 0.0–13.0)
NEUT#: 3.7 10*3/uL (ref 1.5–6.5)
NEUT%: 57.1 % (ref 39.6–76.8)
Platelets: 232 10*3/uL (ref 145–400)
RBC: 4.67 10*6/uL (ref 3.70–5.32)
RDW: 12.7 % (ref 11.3–14.5)
WBC: 6.4 10*3/uL (ref 3.9–10.0)
lymph#: 1.8 10*3/uL (ref 0.9–3.3)

## 2006-06-14 LAB — COMPREHENSIVE METABOLIC PANEL
ALT: 10 U/L (ref 0–40)
AST: 16 U/L (ref 0–37)
Albumin: 4.2 g/dL (ref 3.5–5.2)
Alkaline Phosphatase: 105 U/L (ref 39–117)
BUN: 17 mg/dL (ref 6–23)
CO2: 25 mEq/L (ref 19–32)
Calcium: 9.4 mg/dL (ref 8.4–10.5)
Chloride: 107 mEq/L (ref 96–112)
Creatinine, Ser: 0.81 mg/dL (ref 0.40–1.20)
Glucose, Bld: 85 mg/dL (ref 70–99)
Potassium: 3.8 mEq/L (ref 3.5–5.3)
Sodium: 141 mEq/L (ref 135–145)
Total Bilirubin: 0.3 mg/dL (ref 0.3–1.2)
Total Protein: 7 g/dL (ref 6.0–8.3)

## 2006-06-17 IMAGING — CT CT PELVIS W/ CM
1 of 4 series · 12 of 32 positions shown, 17 images · IV contrast (omnipaque)
Comparison: previous PET scan of 11/17/05 which showed an area of activity associated with a 9mm right high paratracheal node and a CT scan of 11/09/05 which also showed that small node.

CLINICAL DATA: Carcinoma of the right breast.  Previous right lumpectomy diagnosed in February 2001.  Chemotherapy now in progress.  
 CHEST CT WITH CONTRAST:
TECHNIQUE: Multidetector CT imaging of the chest was performed following the standard protocol during bolus administration of intravenous contrast.
 Contrast:  125mL Omnipaque 300
TECHNIQUE: Multidetector CT imaging of the abdomen was performed following the standard protocol during bolus administration of intravenous contrast.
 CT scan of the abdomen utilizing the same contrast as given for the chest shows again the 6 x 9mm low-density area in the inferior aspect of the right lobe of the liver which has not changed and appears to be a small cyst.  The gallbladder, common bile duct, pancreas and spleen appear normal.  The adrenal glands and kidneys are normal.  The oral contrast has passed through the stomach and most of the small bowel without evidence of obstruction.  There is a large amount of fecal material throughout the colon extending from the cecum to the rectum.  The bones of the lower thorax and the upper lumbar spine show no evidence of metastatic disease but are again seen to have some hypertrophic spurring.
TECHNIQUE: Multidetector CT imaging of the pelvis was performed following the standard protocol during bolus administration of intravenous contrast.
 CT scan of the pelvis utilizing the same contrast given for the chest and abdomen show no free fluid or air.  The uterus slight to the left of the midline.  The ovaries appear normal.  Large amount of fecal material in the rectum and sigmoid colon without evidence of obstruction or mass.  The bones of the pelvis appear normal.  There are some mild degenerative hypertrophic spurs of the lower lumbar spine.
 There are stable inguinal nodes which have not changed in size with the largest being on the right and again measuring 18 x 12mm.

[Series 2: cap 5.0 b30f · axial · 0.66mm/px · z∈[-562,-67]mm · 12 of 115 slices shown, 17 images]
[im 8/115  soft-tissue]
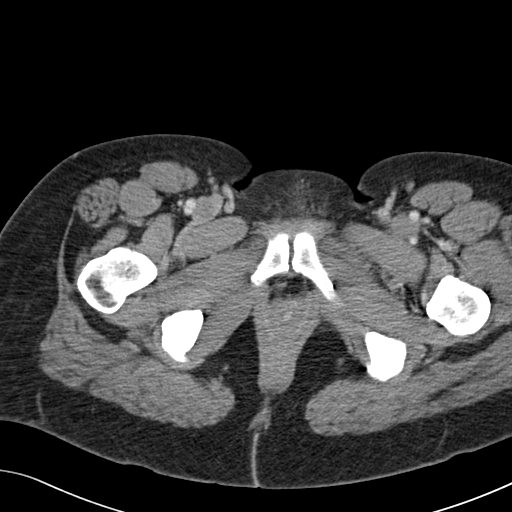
[im 8/115  bone]
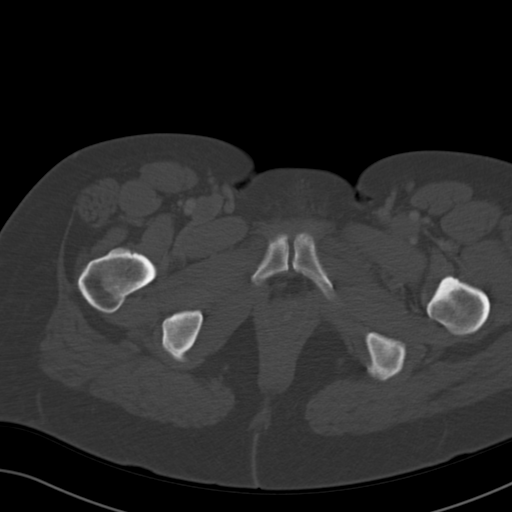
[im 22/115  soft-tissue]
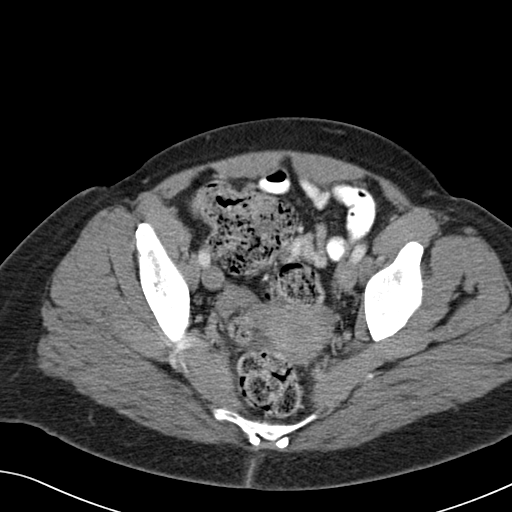
[im 29/115  soft-tissue]
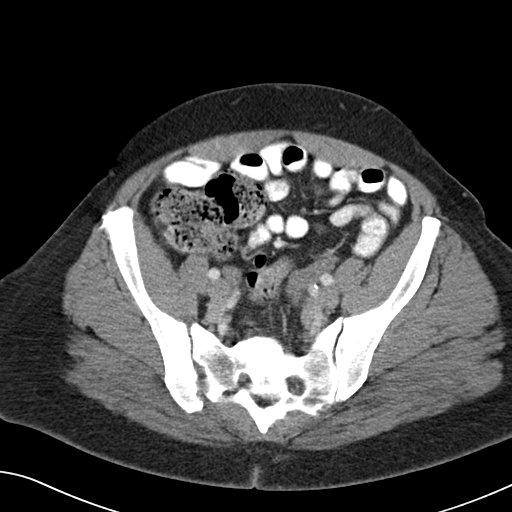
[im 36/115  soft-tissue]
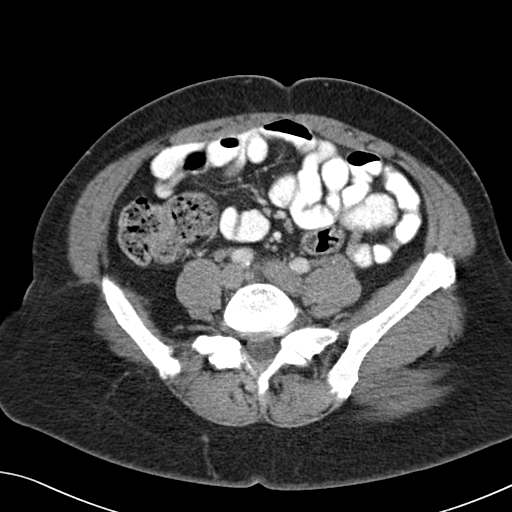
[im 50/115  soft-tissue]
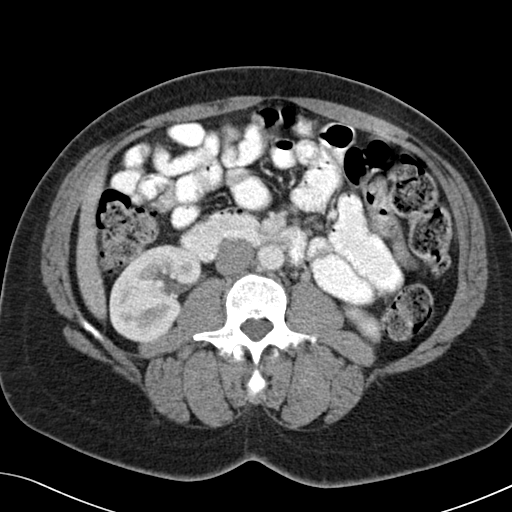
[im 58/115  soft-tissue]
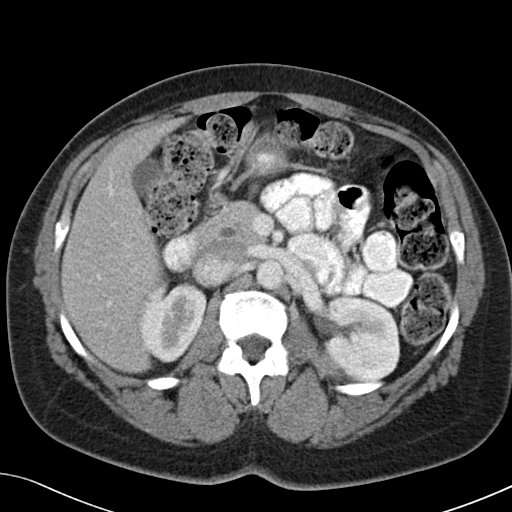
[im 65/115  soft-tissue]
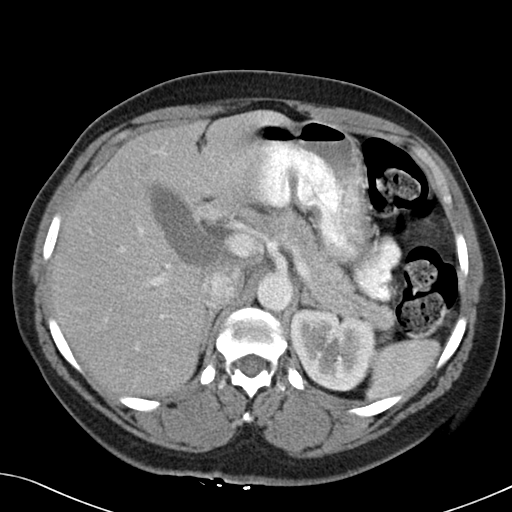
[im 79/115  soft-tissue]
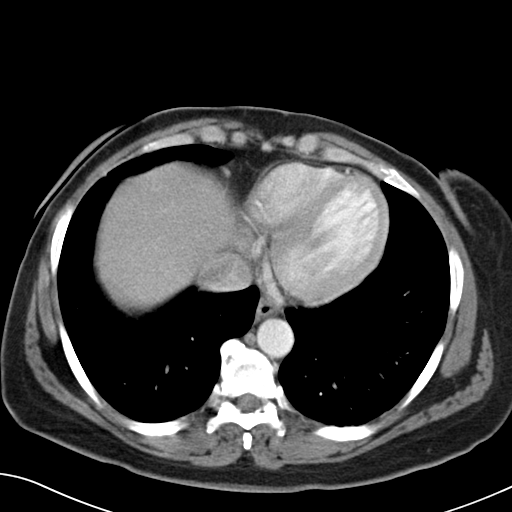
[im 86/115  soft-tissue]
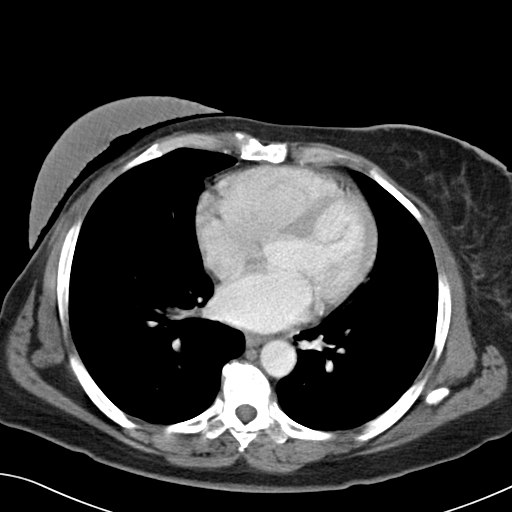
[im 86/115  lung]
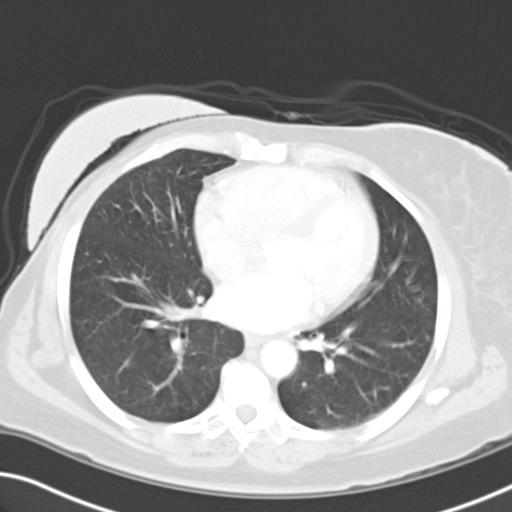
[im 86/115  bone]
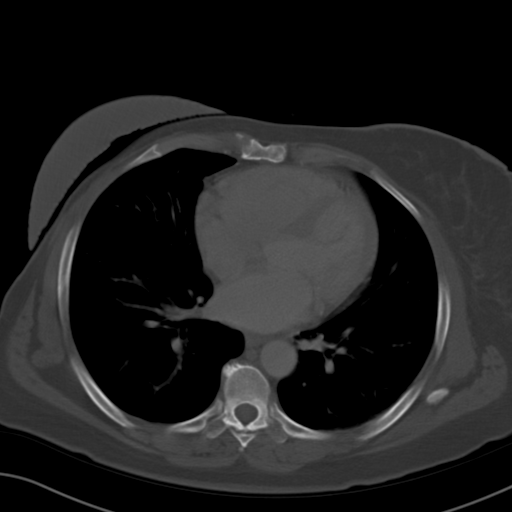
[im 93/115  soft-tissue]
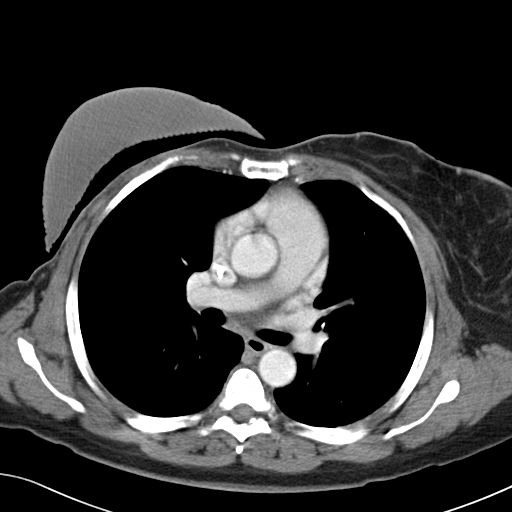
[im 93/115  lung]
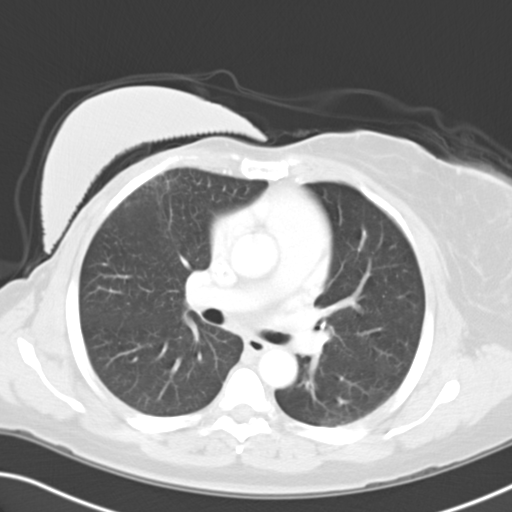
[im 100/115  lung]
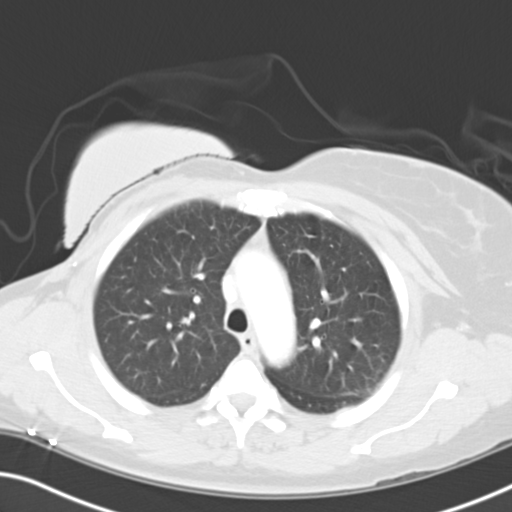
[im 107/115  soft-tissue]
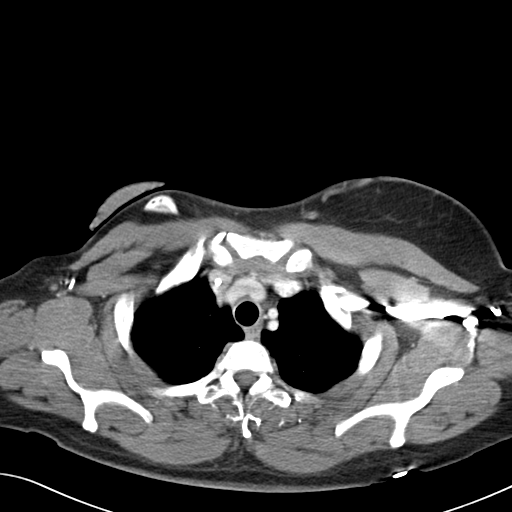
[im 107/115  lung]
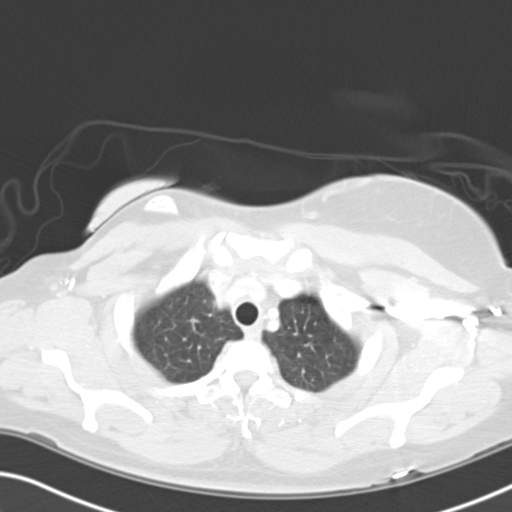

[12 of 32 positions shown; findings below may reference images not displayed]

After the intravenous injection of 125mL of Omnipaque 300 a series of scans of the chest were made and showed the right upper paratracheal node to have decreased significantly in size which its maximum axis now being 7mm.  There is again seen the right mastectomy and axillary node dissection.  No new abnormal lymph nodes are seen.  There is no pericardial or pleural effusion.  The lung parenchyma appears clear except for minimal scarring in the region of the anterior aspect of the right upper lobe.  Bony thorax shows anterior stable degenerative hypertrophic spurs of the mid- and lower thoracic spine.
IMPRESSION: The high right paratracheal lymph node that was noted to be positive on PET scan on 11/17/05 has now significantly decreased in size.  No new nodes are seen.  The right chemotherapeutic catheter is again seen to be in place.  Status post right mastectomy and axillary node dissection.
 ABDOMEN CT WITH CONTRAST:
IMPRESSION: No evidence of active disease CT scan of the abdomen.  Large amounts of fecal material throughout the colon.
 PELVIS CT WITH CONTRAST:
IMPRESSION: No evidence of active disease in the pelvis.  There is a large amount of fecal material within the colon from cecum to rectum.

## 2006-06-26 IMAGING — CR DG CHEST 2V
2 series · 2 of 2 positions shown · non-contrast
Comparison: CT chest dated 02/01/06.

CLINICAL DATA: Pre-op breast cancer.
 CHEST - 2 VIEW:

[view not recorded (1 of 2)]
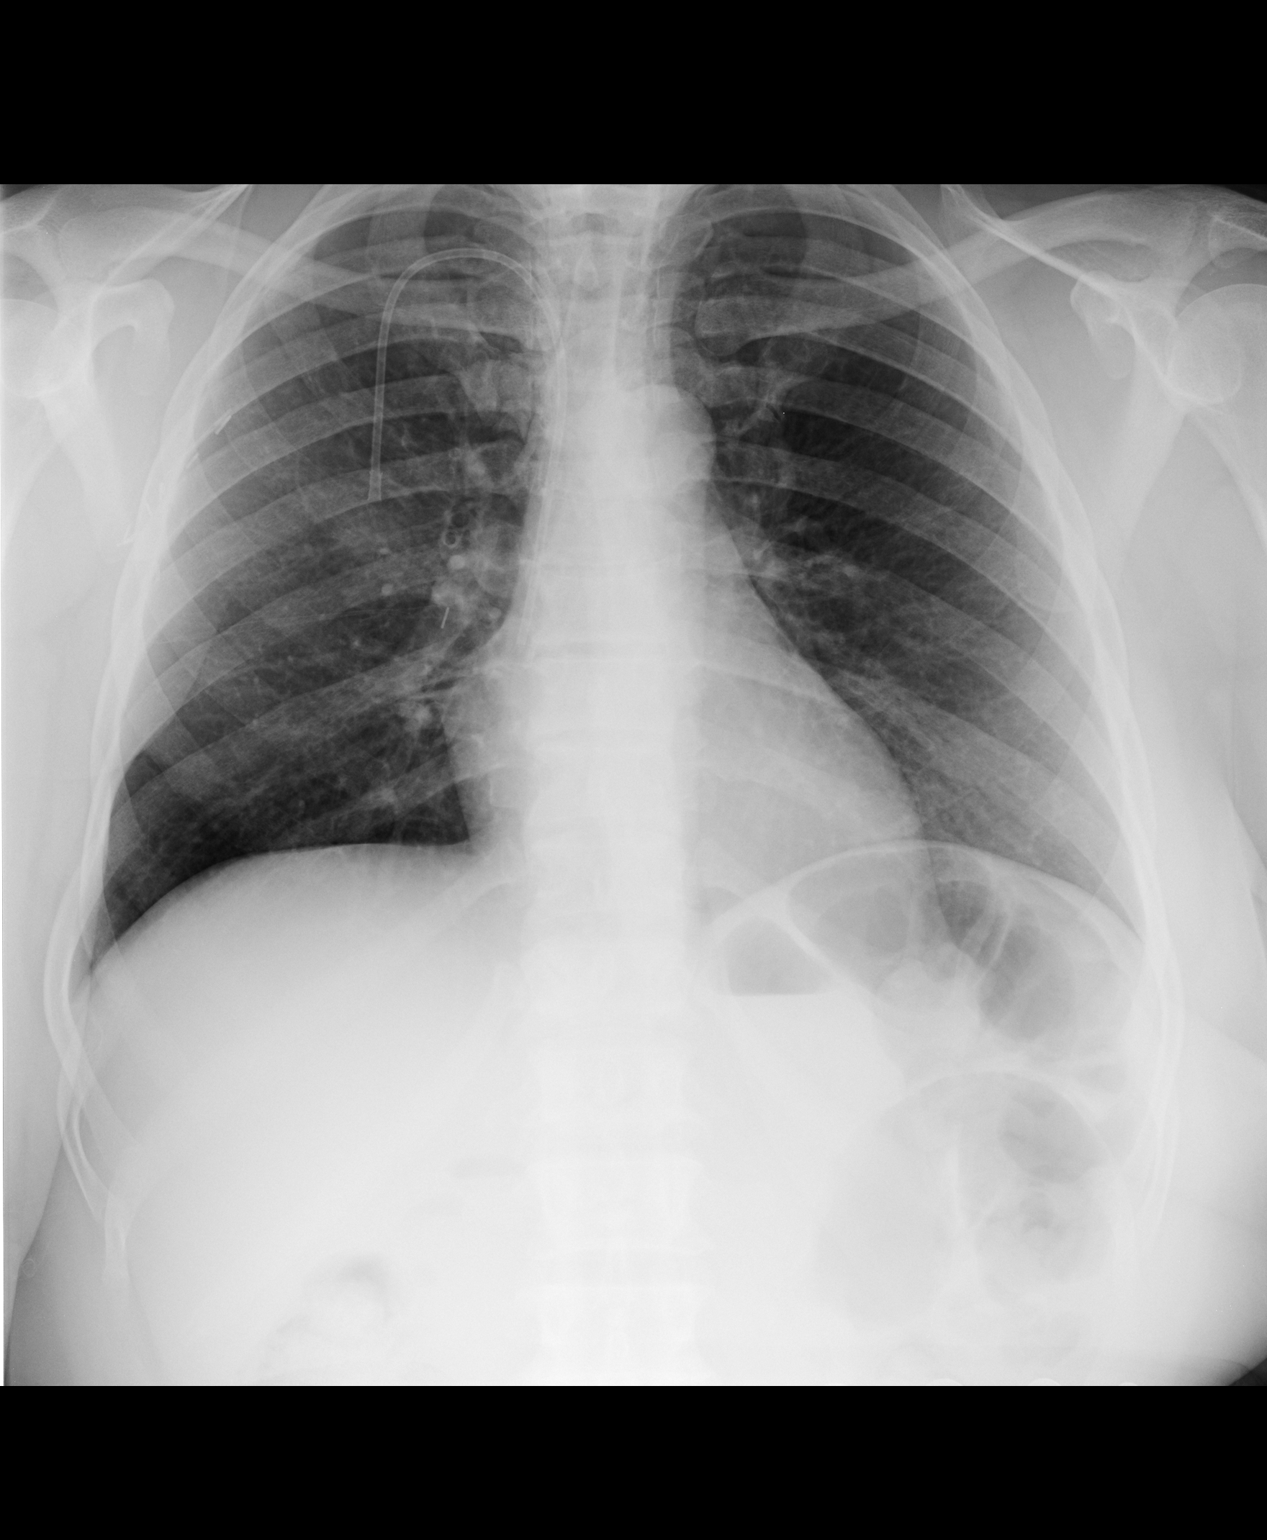

[view not recorded (2 of 2)]
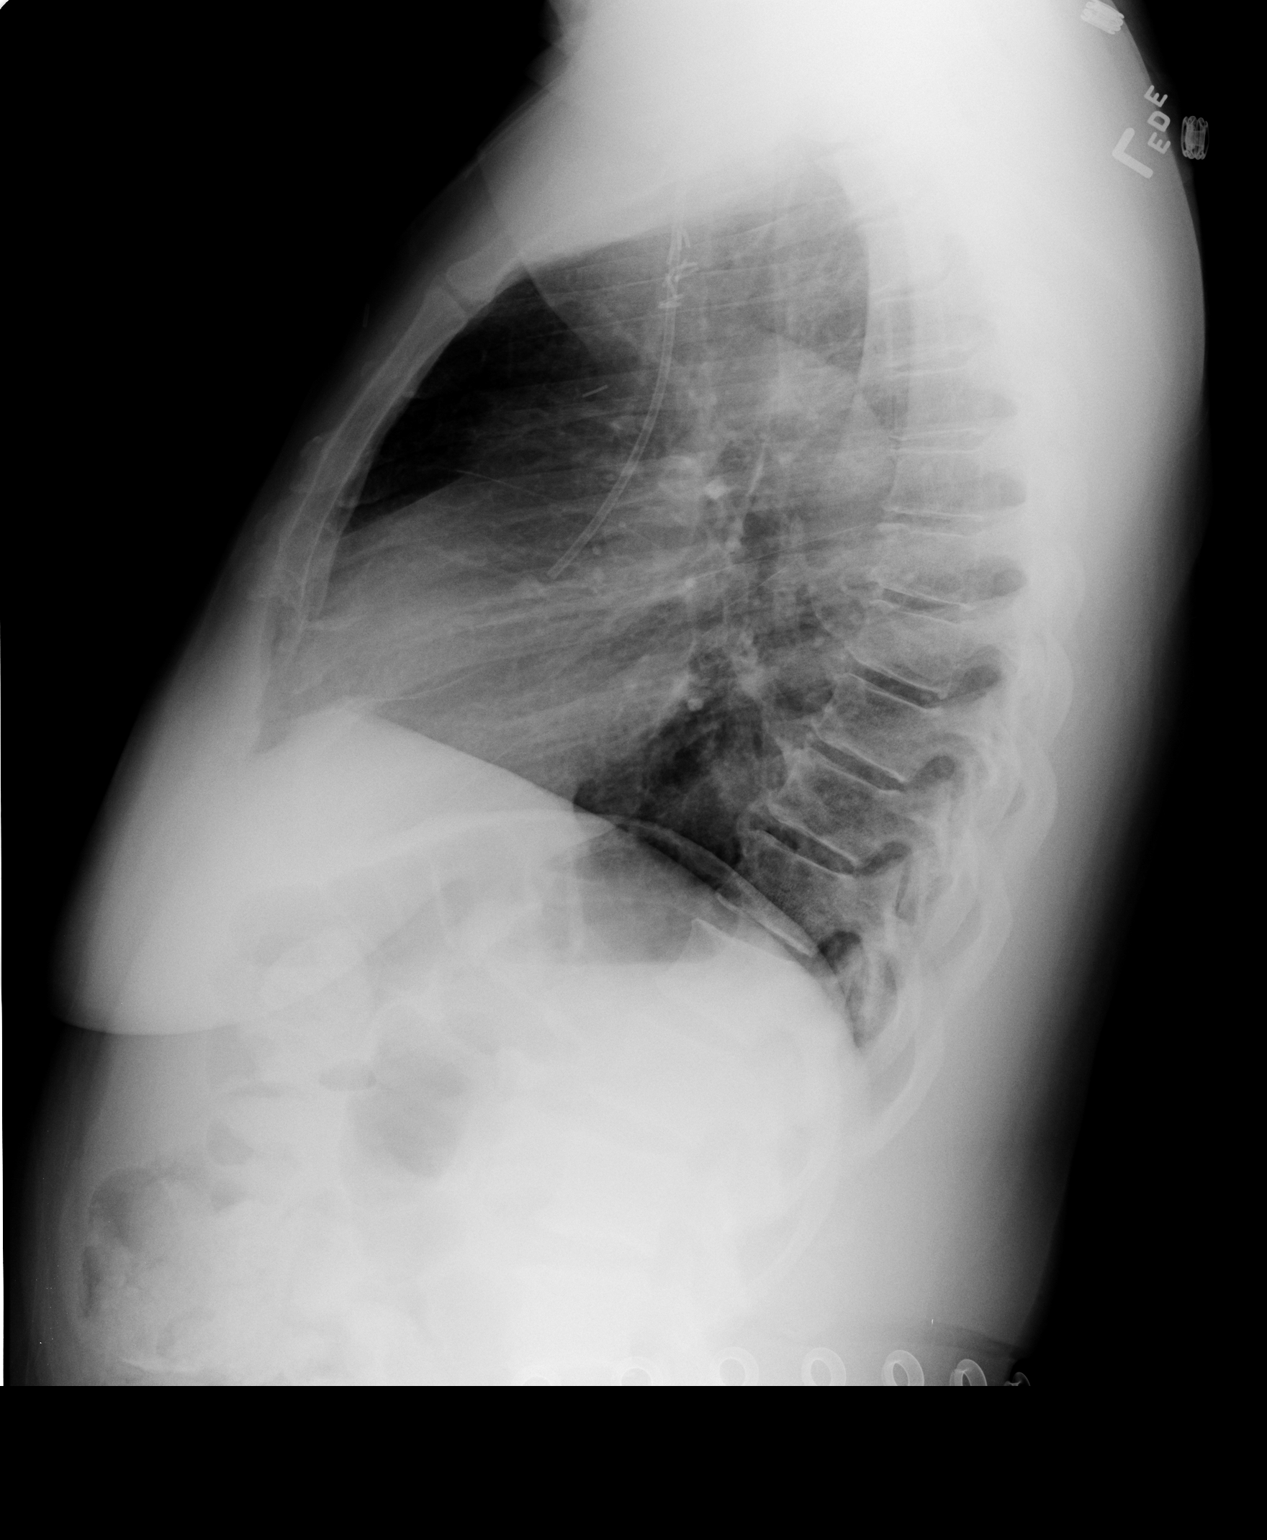

[2 of 2 positions shown; findings below may reference images not displayed]

FINDINGS: There is a right-sided Port-a-cath with the tip in the projection of the cavoatrial junction.  The heart size is normal.  No effusions or edema.  No focal air space disease is noted.
IMPRESSION: No active cardiopulmonary disease.

## 2006-07-03 ENCOUNTER — Ambulatory Visit: Payer: Self-pay | Admitting: Oncology

## 2006-07-05 LAB — COMPREHENSIVE METABOLIC PANEL
ALT: <8 U/L (ref 0–40)
AST: 15 U/L (ref 0–37)
Albumin: 3.7 g/dL (ref 3.5–5.2)
Alkaline Phosphatase: 79 U/L (ref 39–117)
BUN: 13 mg/dL (ref 6–23)
CO2: 21 mEq/L (ref 19–32)
Calcium: 7.9 mg/dL — ABNORMAL LOW (ref 8.4–10.5)
Chloride: 112 mEq/L (ref 96–112)
Creatinine, Ser: 0.79 mg/dL (ref 0.40–1.20)
Glucose, Bld: 88 mg/dL (ref 70–99)
Potassium: 3.3 mEq/L — ABNORMAL LOW (ref 3.5–5.3)
Sodium: 139 mEq/L (ref 135–145)
Total Bilirubin: 0.2 mg/dL — ABNORMAL LOW (ref 0.3–1.2)
Total Protein: 6 g/dL (ref 6.0–8.3)

## 2006-07-05 LAB — CBC WITH DIFFERENTIAL/PLATELET
BASO%: 1.9 % (ref 0.0–2.0)
Basophils Absolute: 0.1 10*3/uL (ref 0.0–0.1)
EOS%: 3.8 % (ref 0.0–7.0)
Eosinophils Absolute: 0.2 10*3/uL (ref 0.0–0.5)
HCT: 41.2 % (ref 34.8–46.6)
HGB: 13.4 g/dL (ref 11.6–15.9)
LYMPH%: 31.5 % (ref 14.0–48.0)
MCH: 28.4 pg (ref 26.0–34.0)
MCHC: 32.6 g/dL (ref 32.0–36.0)
MCV: 87.1 fL (ref 81.0–101.0)
MONO#: 0.3 10*3/uL (ref 0.1–0.9)
MONO%: 6 % (ref 0.0–13.0)
NEUT#: 2.6 10*3/uL (ref 1.5–6.5)
NEUT%: 56.8 % (ref 39.6–76.8)
Platelets: 237 10*3/uL (ref 145–400)
RBC: 4.73 10*6/uL (ref 3.70–5.32)
RDW: 13.3 % (ref 11.3–14.5)
WBC: 4.7 10*3/uL (ref 3.9–10.0)
lymph#: 1.5 10*3/uL (ref 0.9–3.3)

## 2006-07-05 LAB — CANCER ANTIGEN 27.29: CA 27.29: 20 U/mL (ref 0–39)

## 2006-07-05 LAB — LACTATE DEHYDROGENASE: LDH: 127 U/L (ref 94–250)

## 2006-07-22 IMAGING — NM NM CARDIA MUGA REST
6 series · 36 of 36 positions shown · non-contrast
Comparison: none

CLINICAL DATA: Patient is undergoing chemotherapy for breast cancer.
NUCLEAR MEDICINE CARDIAC BLOOD POOL IMAGING (MUGA):
TECHNIQUE: Cardiac multi-gated acquisition was performed at rest following 
intravenous injection of Kc-LLm labeled red blood cells.
Radiopharmaceutical: 20 mCi Kc-LLm in-vitro labeled red blood cells.

[Series 1: mu muga · 3.86mm/px · 6 of 16 frames shown (1 of 6)]
[frame 2/16]
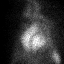
[frame 4/16]
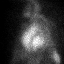
[frame 7/16]
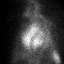
[frame 10/16]
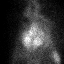
[frame 12/16]
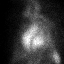
[frame 15/16]
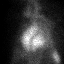

[Series 1: mu muga · 3.86mm/px · 6 of 16 frames shown (2 of 6)]
[frame 2/16]
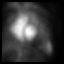
[frame 4/16]
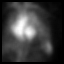
[frame 7/16]
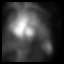
[frame 10/16]
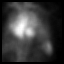
[frame 12/16]
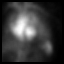
[frame 15/16]
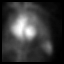

[Series 1: mu muga · 3.86mm/px · 6 of 16 frames shown (3 of 6)]
[frame 2/16]
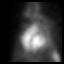
[frame 4/16]
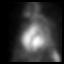
[frame 7/16]
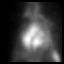
[frame 10/16]
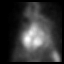
[frame 12/16]
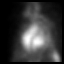
[frame 15/16]
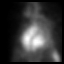

[Series 1: mu muga · 3.86mm/px · 6 of 16 frames shown (4 of 6)]
[frame 2/16]
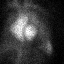
[frame 4/16]
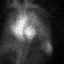
[frame 7/16]
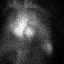
[frame 10/16]
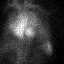
[frame 12/16]
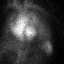
[frame 15/16]
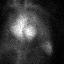

[Series 1: mu muga · 3.86mm/px · 6 of 16 frames shown (5 of 6)]
[frame 2/16]
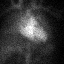
[frame 4/16]
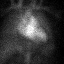
[frame 7/16]
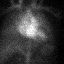
[frame 10/16]
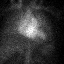
[frame 12/16]
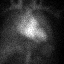
[frame 15/16]
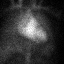

[Series 1: mu muga · 3.86mm/px · 6 of 16 frames shown (6 of 6)]
[frame 2/16]
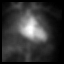
[frame 4/16]
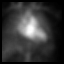
[frame 7/16]
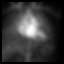
[frame 10/16]
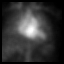
[frame 12/16]
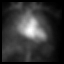
[frame 15/16]
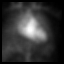

[36 of 36 positions shown; findings below may reference images not displayed]

FINDINGS: Using region of interest calculations, 2 separate calculations for ejection fraction were obtained.  Respectively, the calculated ejection fraction is 50.1% and 50.6%.
IMPRESSION: Left ventricular ejection fraction calculated at approximately 50%.  This is decreased from approximately 60% on the previous study, but is not substantially different than the values obtained on the previous studies from 11/24/04, and 07/30/04.

## 2006-07-26 ENCOUNTER — Ambulatory Visit: Payer: Self-pay | Admitting: Cardiology

## 2006-07-26 ENCOUNTER — Encounter: Payer: Self-pay | Admitting: Vascular Surgery

## 2006-07-26 ENCOUNTER — Encounter: Payer: Self-pay | Admitting: Cardiology

## 2006-07-26 ENCOUNTER — Ambulatory Visit: Admission: RE | Admit: 2006-07-26 | Discharge: 2006-07-26 | Payer: Self-pay | Admitting: Oncology

## 2006-07-26 LAB — CBC WITH DIFFERENTIAL/PLATELET
BASO%: 1.9 % (ref 0.0–2.0)
Basophils Absolute: 0.1 10*3/uL (ref 0.0–0.1)
EOS%: 4 % (ref 0.0–7.0)
Eosinophils Absolute: 0.3 10*3/uL (ref 0.0–0.5)
HCT: 37 % (ref 34.8–46.6)
HGB: 12.4 g/dL (ref 11.6–15.9)
LYMPH%: 32.2 % (ref 14.0–48.0)
MCH: 28.9 pg (ref 26.0–34.0)
MCHC: 33.4 g/dL (ref 32.0–36.0)
MCV: 86.4 fL (ref 81.0–101.0)
MONO#: 0.5 10*3/uL (ref 0.1–0.9)
MONO%: 8 % (ref 0.0–13.0)
NEUT#: 3.6 10*3/uL (ref 1.5–6.5)
NEUT%: 54 % (ref 39.6–76.8)
Platelets: 274 10*3/uL (ref 145–400)
RBC: 4.28 10*6/uL (ref 3.70–5.32)
RDW: 13 % (ref 11.3–14.5)
WBC: 6.7 10*3/uL (ref 3.9–10.0)
lymph#: 2.1 10*3/uL (ref 0.9–3.3)

## 2006-08-14 ENCOUNTER — Ambulatory Visit: Payer: Self-pay | Admitting: Oncology

## 2006-09-05 LAB — CBC WITH DIFFERENTIAL/PLATELET
BASO%: 1.9 % (ref 0.0–2.0)
Basophils Absolute: 0.1 10*3/uL (ref 0.0–0.1)
EOS%: 6.6 % (ref 0.0–7.0)
Eosinophils Absolute: 0.4 10*3/uL (ref 0.0–0.5)
HCT: 36.7 % (ref 34.8–46.6)
HGB: 12.5 g/dL (ref 11.6–15.9)
LYMPH%: 30.9 % (ref 14.0–48.0)
MCH: 29 pg (ref 26.0–34.0)
MCHC: 34.1 g/dL (ref 32.0–36.0)
MCV: 85 fL (ref 81.0–101.0)
MONO#: 0.5 10*3/uL (ref 0.1–0.9)
MONO%: 8.8 % (ref 0.0–13.0)
NEUT#: 3 10*3/uL (ref 1.5–6.5)
NEUT%: 51.8 % (ref 39.6–76.8)
Platelets: 296 10*3/uL (ref 145–400)
RBC: 4.32 10*6/uL (ref 3.70–5.32)
RDW: 12.3 % (ref 11.3–14.5)
WBC: 5.9 10*3/uL (ref 3.9–10.0)
lymph#: 1.8 10*3/uL (ref 0.9–3.3)

## 2006-09-12 ENCOUNTER — Ambulatory Visit (HOSPITAL_COMMUNITY): Admission: RE | Admit: 2006-09-12 | Discharge: 2006-09-12 | Payer: Self-pay | Admitting: Oncology

## 2006-09-16 IMAGING — PT NM PET TUM IMG SKULL BASE T - THIGH
4 series · 25 of 25 positions shown · IV contrast (omnipaque)
Comparison: 11/09/05.
COMPARISON: 02/01/06.
COMPARISON: 01/24/06.
COMPARISON: 02/01/06.
COMPARISON: 11/17/05.

CLINICAL DATA: Bilateral breast cancer.  Restaging.
HEAD CT WITHOUT AND WITH CONTRAST:
TECHNIQUE: Contiguous axial images were obtained from the base of the skull through the vertex according to standard protocol before and after administration of intravenous contrast.
Contrast:  125 cc Omnipaque 300.
TECHNIQUE: Multidetector CT imaging of the chest was performed following the standard protocol during bolus administration of intravenous contrast.
TECHNIQUE: Multidetector CT imaging of the abdomen was performed following the standard protocol during bolus administration of intravenous contrast.
TECHNIQUE: Multidetector CT imaging of the pelvis was performed following the standard protocol during bolus administration of intravenous contrast.
TECHNIQUE: 18.7 mCi F-18 FDG were administered via the left foot.  Full ring PET imaging was performed from the skull base through the mid-thighs 60 minutes after injection.  CT data was obtained and used for attenuation correction and anatomic localization only.  (This was not acquired as a diagnostic CT examination.)

[Series 1: pet ac · axial · 3.3mm · 4.69mm/px · z∈[-870,+0]mm · 8 of 267 slices shown]
[im 1/267]
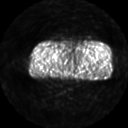
[im 39/267]
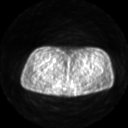
[im 77/267]
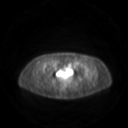
[im 115/267]
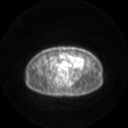
[im 153/267]
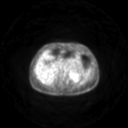
[im 191/267]
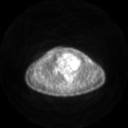
[im 229/267]
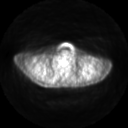
[im 267/267]
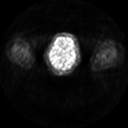

[Series 2: pet nac · axial · 3.3mm · 4.69mm/px · z∈[-870,+0]mm · 8 of 267 slices shown]
[im 1/267]
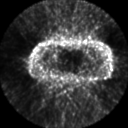
[im 39/267]
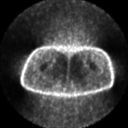
[im 77/267]
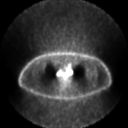
[im 115/267]
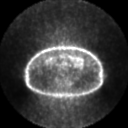
[im 153/267]
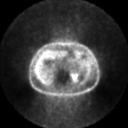
[im 191/267]
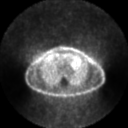
[im 229/267]
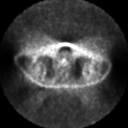
[im 267/267]
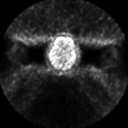

[Series 2: ct images · axial · 3.8mm · 0.98mm/px · z∈[-870,+0]mm · 8 of 266 slices shown]
[im 1/266]
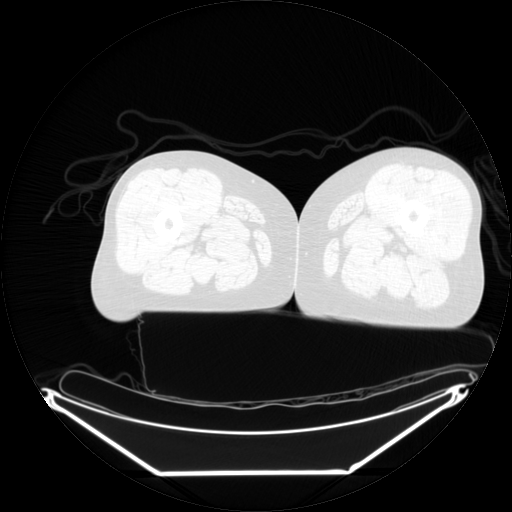
[im 38/266]
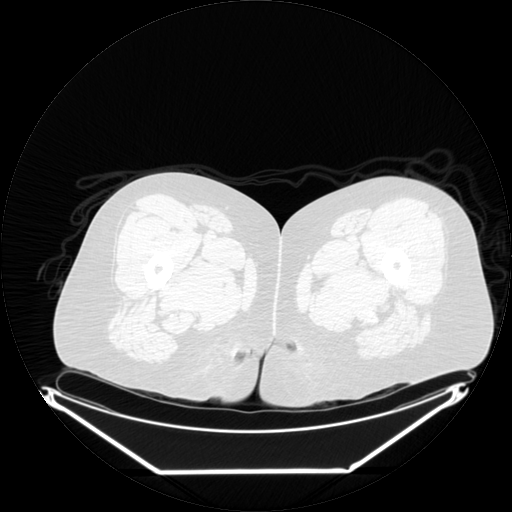
[im 76/266]
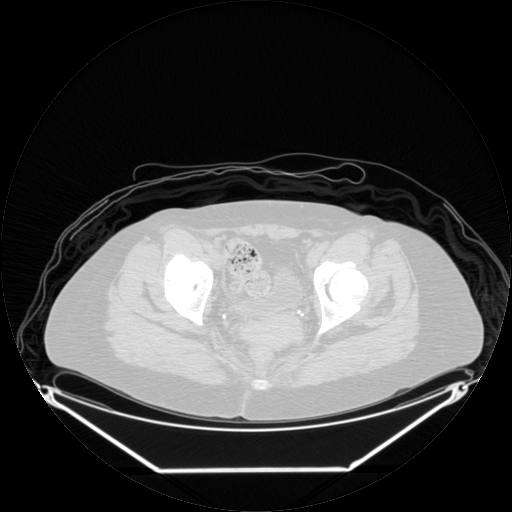
[im 114/266]
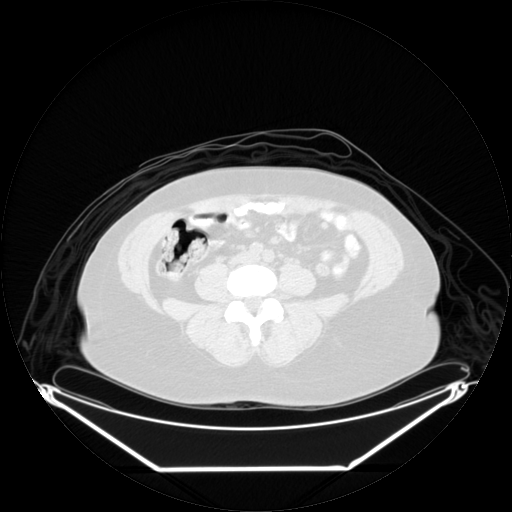
[im 152/266]
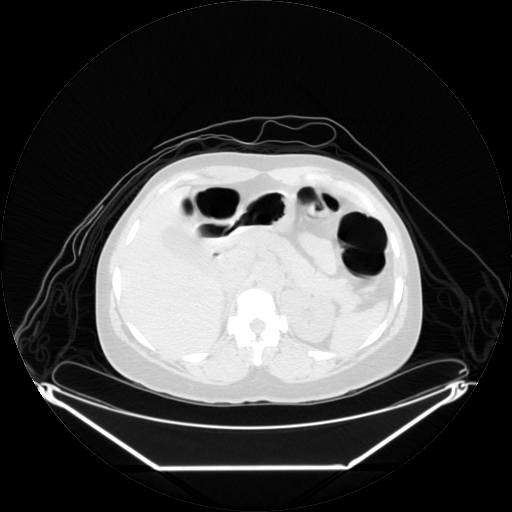
[im 190/266]
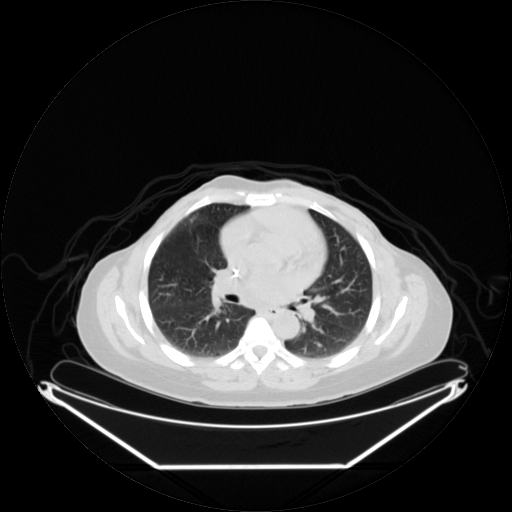
[im 228/266]
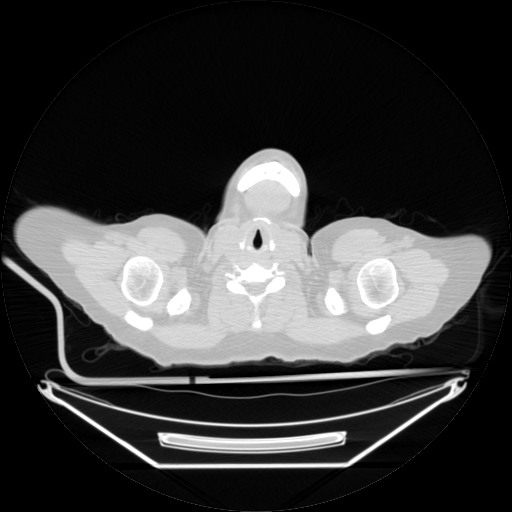
[im 266/266  brain]
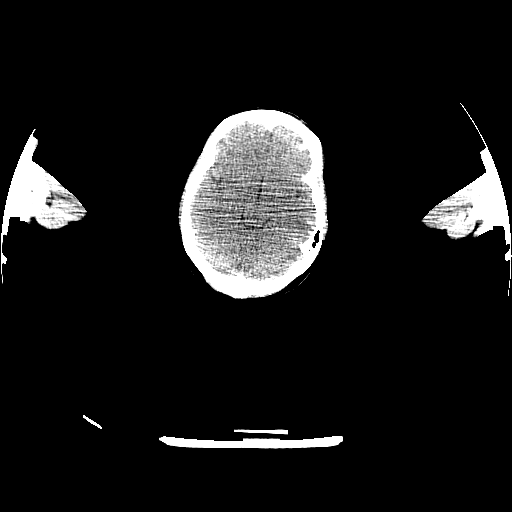

[Series 123: mip · coronal · 3.3mm · 4.69mm/px · 1 of 30 slices shown]
[im 1/30]
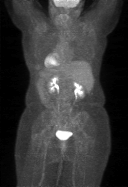

[25 of 25 positions shown; findings below may reference images not displayed]

FINDINGS: Ventricles and sulci are of normal size without hydrocephalus or midline shift.  No intracranial hemorrhage.  No intracranial abnormal enhancing lesion or bony destructive lesion to suggest the presence of intracranial metastatic disease.
IMPRESSION: No evidence of intracranial metastatic disease.
CHEST CT WITH CONTRAST:
FINDINGS: No pulmonary embolus.  No mediastinal, hilar, or axillary adenopathy.  Status post mastectomies.  Right central line tip mid to lower right atrium.  Post radiation therapy type changes anterior aspects right upper lobe and right middle lobe stable.  No new lung parenchymal abnormality.  No bony destructive lesion.
IMPRESSION: No evidence of metastatic disease in the region of the chest.  
ABDOMEN CT WITH CONTRAST:
FINDINGS: 0.8 x 0.6 cm low density lesion possibly representing cyst within the inferior aspect of right lobe of the liver unchanged.  Normal size retroperitoneal and mesenteric lymph nodes.  Spleen, adrenal glands, kidneys, and pancreas unremarkable.  Mild atherosclerotic type changes aorta without aneurysmal dilatation.  Degenerative changes lumbar spine without bony destructive lesion.  No obvious bowel abnormality.
IMPRESSION: No evidence of metastatic disease.  The low density lesion within the inferior aspect of the right lobe of the liver remains stable and may represent a cyst.  
PELVIS CT WITH CONTRAST:
FINDINGS: Degenerative changes left hip with subchondral cystic changes left acetabulum.  Degenerative changes sacroiliac joints and lower lumbar spine without bony destructive lesion.  No evidence of metastatic disease in the region of the pelvis.  Left ovary has shifted position, now located slightly more anteriorly, but otherwise uterus and ovaries appear to be similar to that of prior exam.  Injection was via the foot, and there is a mixture of dense contrast with unopacified blood within the iliac veins, and, therefore, limited evaluation with respect to detecting thrombus.  Normal size iliac lymph nodes.  Slightly prominent ovarian veins.
IMPRESSION: No evidence of metastatic disease in the region of the pelvis. 
FDG PET-CT TUMOR IMAGING (SKULL BASE TO THIGHS):
Fasting Blood Glucose:  115.
FINDINGS: Interval clearing of abnormal FDG uptake previously noted along the right paratracheal region.  Minimal FDG uptake seen along the axilla/lateral aspect of the breasts bilaterally with maximal SUV of 1.2 g/mL on the right and 1.4 g/mL on the left without findings to suggest the presence of metastatic disease.
IMPRESSION: No regions of abnormal FDG uptake with interval clearing of previously noted FDG uptake seen along the right paratracheal region.

## 2006-09-16 IMAGING — CT CT PELVIS W/ CM
1 of 5 series · 11 of 32 positions shown, 17 images · IV contrast (omnipaque)
Comparison: 11/09/05.
COMPARISON: 02/01/06.
COMPARISON: 01/24/06.
COMPARISON: 02/01/06.
COMPARISON: 11/17/05.

CLINICAL DATA: Bilateral breast cancer.  Restaging.
HEAD CT WITHOUT AND WITH CONTRAST:
TECHNIQUE: Contiguous axial images were obtained from the base of the skull through the vertex according to standard protocol before and after administration of intravenous contrast.
Contrast:  125 cc Omnipaque 300.
TECHNIQUE: Multidetector CT imaging of the chest was performed following the standard protocol during bolus administration of intravenous contrast.
TECHNIQUE: Multidetector CT imaging of the abdomen was performed following the standard protocol during bolus administration of intravenous contrast.
TECHNIQUE: Multidetector CT imaging of the pelvis was performed following the standard protocol during bolus administration of intravenous contrast.
TECHNIQUE: 18.7 mCi F-18 FDG were administered via the left foot.  Full ring PET imaging was performed from the skull base through the mid-thighs 60 minutes after injection.  CT data was obtained and used for attenuation correction and anatomic localization only.  (This was not acquired as a diagnostic CT examination.)

[Series 6: cap 5.0 b30f · axial · 0.66mm/px · z∈[-818,-323]mm · 11 of 119 slices shown, 17 images]
[im 10/119  soft-tissue]
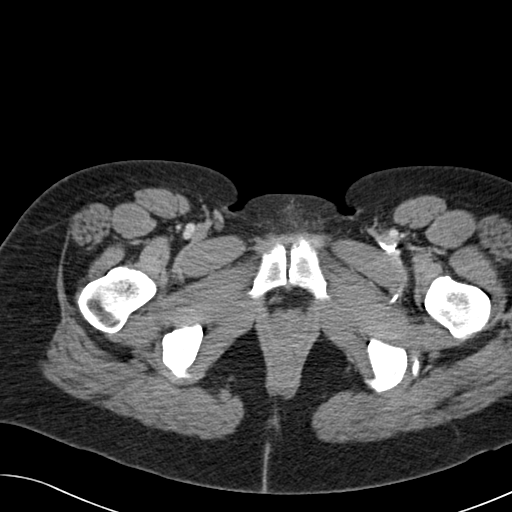
[im 10/119  bone]
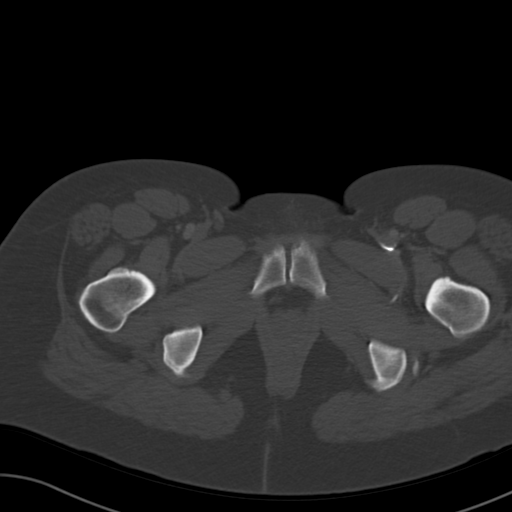
[im 19/119  soft-tissue]
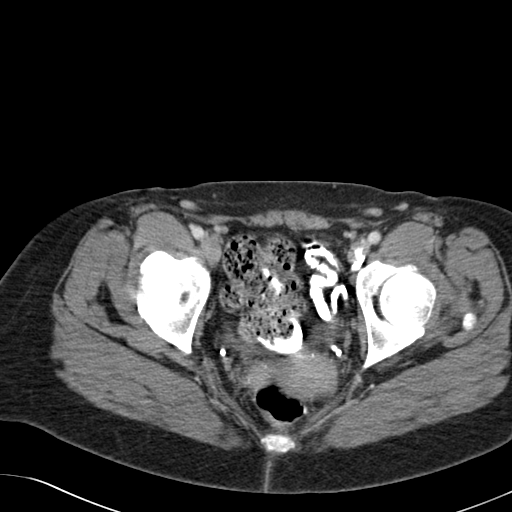
[im 28/119  soft-tissue]
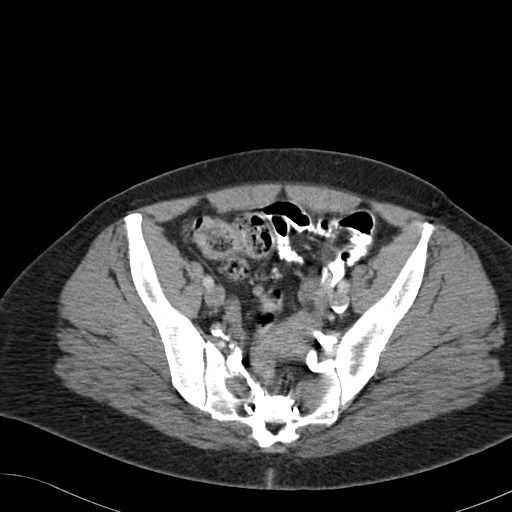
[im 37/119  soft-tissue]
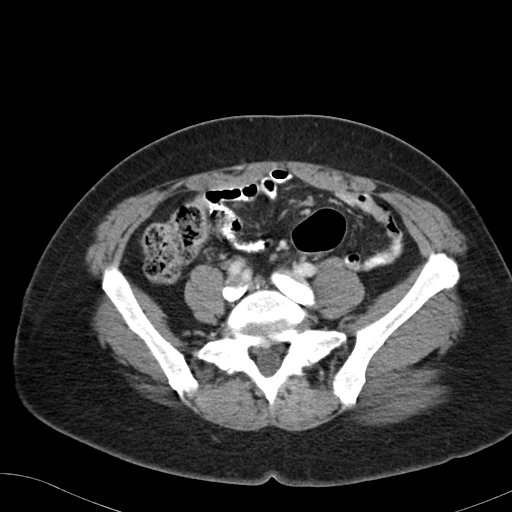
[im 46/119  soft-tissue]
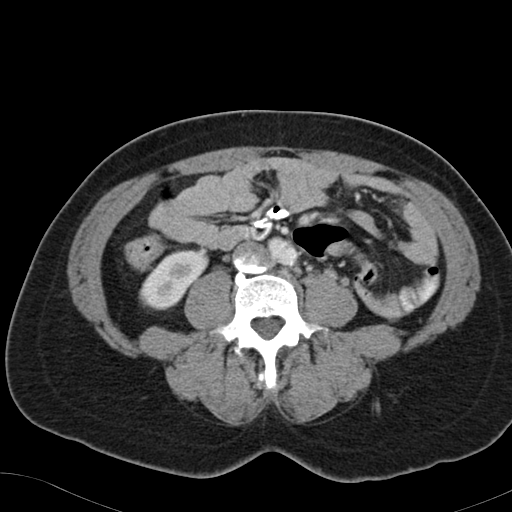
[im 64/119  soft-tissue]
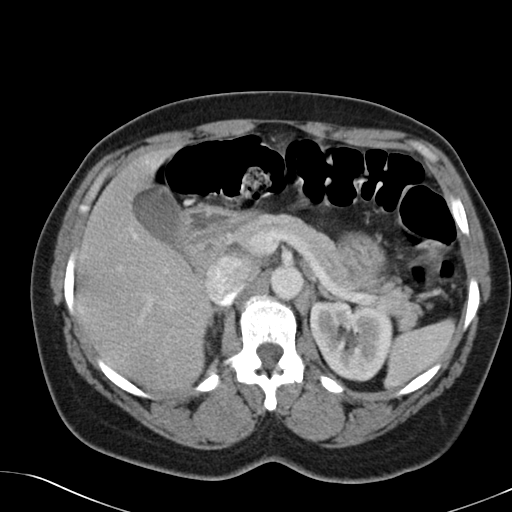
[im 73/119  soft-tissue]
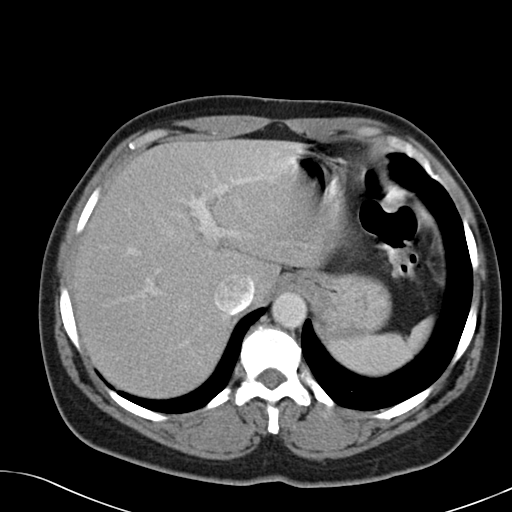
[im 82/119  soft-tissue]
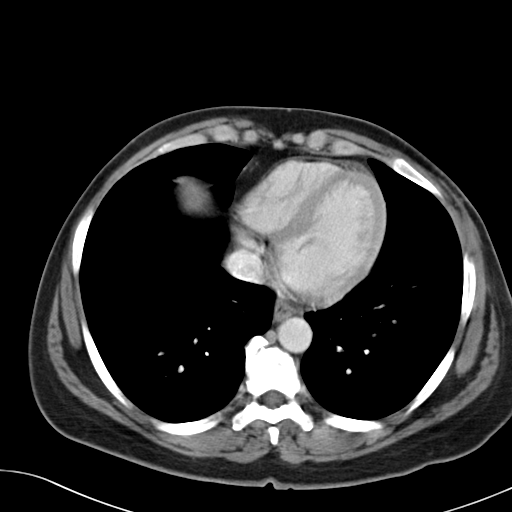
[im 82/119  lung]
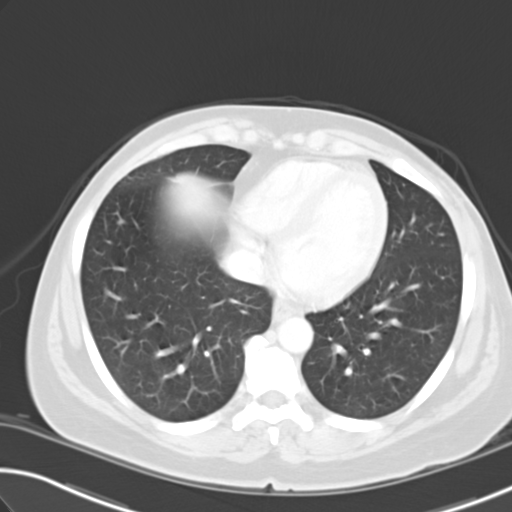
[im 91/119  soft-tissue]
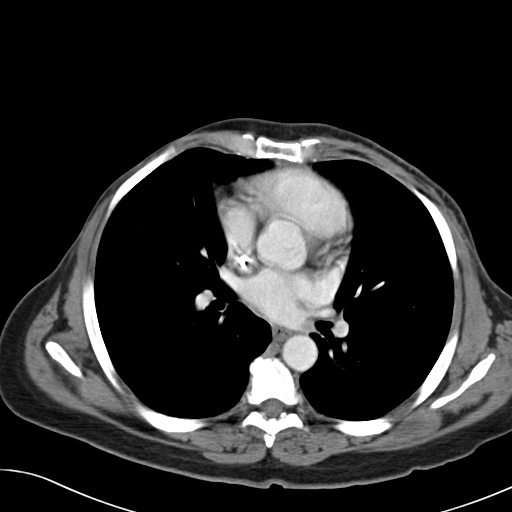
[im 91/119  lung]
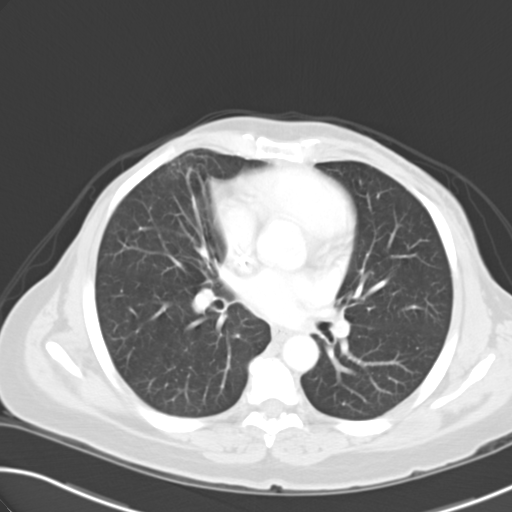
[im 91/119  bone]
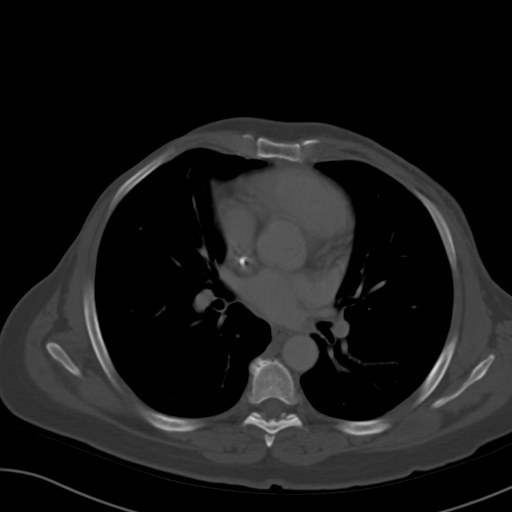
[im 100/119  soft-tissue]
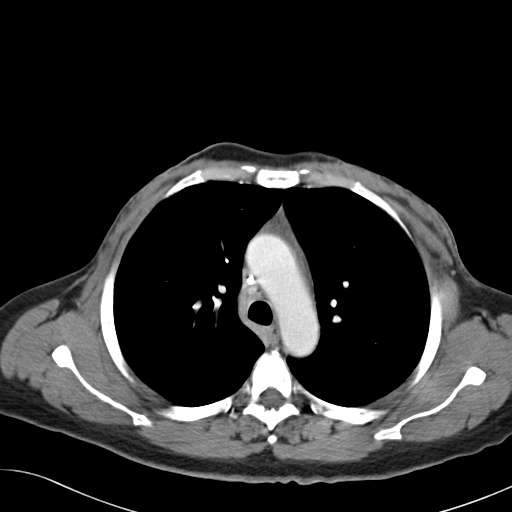
[im 100/119  lung]
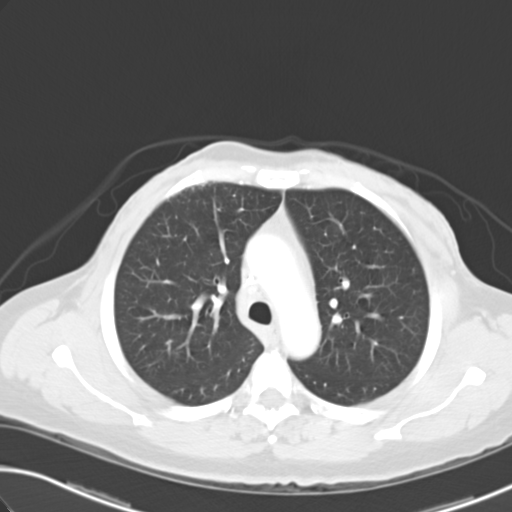
[im 109/119  soft-tissue]
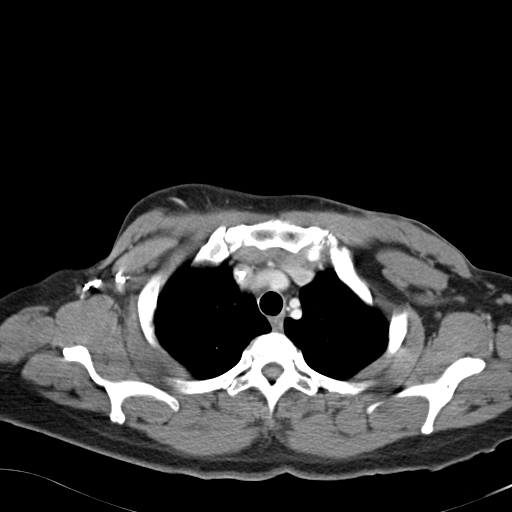
[im 109/119  lung]
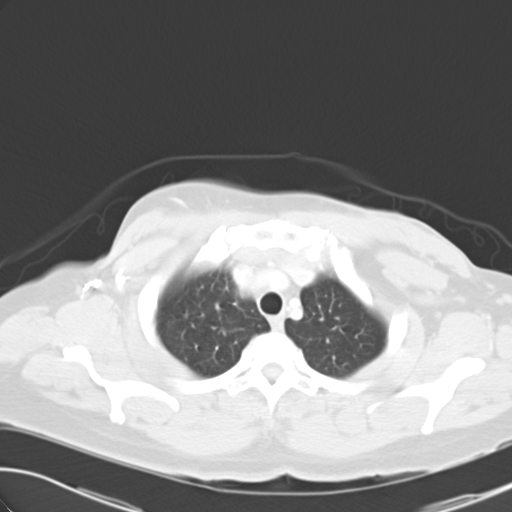

[11 of 32 positions shown; findings below may reference images not displayed]

FINDINGS: Ventricles and sulci are of normal size without hydrocephalus or midline shift.  No intracranial hemorrhage.  No intracranial abnormal enhancing lesion or bony destructive lesion to suggest the presence of intracranial metastatic disease.
IMPRESSION: No evidence of intracranial metastatic disease.
CHEST CT WITH CONTRAST:
FINDINGS: No pulmonary embolus.  No mediastinal, hilar, or axillary adenopathy.  Status post mastectomies.  Right central line tip mid to lower right atrium.  Post radiation therapy type changes anterior aspects right upper lobe and right middle lobe stable.  No new lung parenchymal abnormality.  No bony destructive lesion.
IMPRESSION: No evidence of metastatic disease in the region of the chest.  
ABDOMEN CT WITH CONTRAST:
FINDINGS: 0.8 x 0.6 cm low density lesion possibly representing cyst within the inferior aspect of right lobe of the liver unchanged.  Normal size retroperitoneal and mesenteric lymph nodes.  Spleen, adrenal glands, kidneys, and pancreas unremarkable.  Mild atherosclerotic type changes aorta without aneurysmal dilatation.  Degenerative changes lumbar spine without bony destructive lesion.  No obvious bowel abnormality.
IMPRESSION: No evidence of metastatic disease.  The low density lesion within the inferior aspect of the right lobe of the liver remains stable and may represent a cyst.  
PELVIS CT WITH CONTRAST:
FINDINGS: Degenerative changes left hip with subchondral cystic changes left acetabulum.  Degenerative changes sacroiliac joints and lower lumbar spine without bony destructive lesion.  No evidence of metastatic disease in the region of the pelvis.  Left ovary has shifted position, now located slightly more anteriorly, but otherwise uterus and ovaries appear to be similar to that of prior exam.  Injection was via the foot, and there is a mixture of dense contrast with unopacified blood within the iliac veins, and, therefore, limited evaluation with respect to detecting thrombus.  Normal size iliac lymph nodes.  Slightly prominent ovarian veins.
IMPRESSION: No evidence of metastatic disease in the region of the pelvis. 
FDG PET-CT TUMOR IMAGING (SKULL BASE TO THIGHS):
Fasting Blood Glucose:  115.
FINDINGS: Interval clearing of abnormal FDG uptake previously noted along the right paratracheal region.  Minimal FDG uptake seen along the axilla/lateral aspect of the breasts bilaterally with maximal SUV of 1.2 g/mL on the right and 1.4 g/mL on the left without findings to suggest the presence of metastatic disease.
IMPRESSION: No regions of abnormal FDG uptake with interval clearing of previously noted FDG uptake seen along the right paratracheal region.

## 2006-09-26 ENCOUNTER — Ambulatory Visit: Payer: Self-pay | Admitting: Oncology

## 2006-09-26 LAB — CBC WITH DIFFERENTIAL/PLATELET
BASO%: 2.2 % — ABNORMAL HIGH (ref 0.0–2.0)
Basophils Absolute: 0.1 10*3/uL (ref 0.0–0.1)
EOS%: 8.2 % — ABNORMAL HIGH (ref 0.0–7.0)
Eosinophils Absolute: 0.4 10*3/uL (ref 0.0–0.5)
HCT: 39.4 % (ref 34.8–46.6)
HGB: 12.4 g/dL (ref 11.6–15.9)
LYMPH%: 38.3 % (ref 14.0–48.0)
MCH: 28 pg (ref 26.0–34.0)
MCHC: 31.5 g/dL — ABNORMAL LOW (ref 32.0–36.0)
MCV: 88.9 fL (ref 81.0–101.0)
MONO#: 0.6 10*3/uL (ref 0.1–0.9)
MONO%: 12.4 % (ref 0.0–13.0)
NEUT#: 1.9 10*3/uL (ref 1.5–6.5)
NEUT%: 38.9 % — ABNORMAL LOW (ref 39.6–76.8)
Platelets: 199 10*3/uL (ref 145–400)
RBC: 4.43 10*6/uL (ref 3.70–5.32)
RDW: 12.9 % (ref 11.3–14.5)
WBC: 4.9 10*3/uL (ref 3.9–10.0)
lymph#: 1.9 10*3/uL (ref 0.9–3.3)

## 2006-09-27 LAB — COMPREHENSIVE METABOLIC PANEL
ALT: 13 U/L (ref 0–35)
AST: 19 U/L (ref 0–37)
Albumin: 4.1 g/dL (ref 3.5–5.2)
Alkaline Phosphatase: 104 U/L (ref 39–117)
BUN: 9 mg/dL (ref 6–23)
CO2: 26 mEq/L (ref 19–32)
Calcium: 9.2 mg/dL (ref 8.4–10.5)
Chloride: 108 mEq/L (ref 96–112)
Creatinine, Ser: 0.86 mg/dL (ref 0.40–1.20)
Glucose, Bld: 122 mg/dL — ABNORMAL HIGH (ref 70–99)
Potassium: 3.7 mEq/L (ref 3.5–5.3)
Sodium: 144 mEq/L (ref 135–145)
Total Bilirubin: 0.2 mg/dL — ABNORMAL LOW (ref 0.3–1.2)
Total Protein: 6.6 g/dL (ref 6.0–8.3)

## 2006-09-27 LAB — CANCER ANTIGEN 27.29: CA 27.29: 24 U/mL (ref 0–39)

## 2006-10-16 LAB — CBC WITH DIFFERENTIAL/PLATELET
BASO%: 1.3 % (ref 0.0–2.0)
Basophils Absolute: 0.1 10*3/uL (ref 0.0–0.1)
EOS%: 3 % (ref 0.0–7.0)
Eosinophils Absolute: 0.2 10*3/uL (ref 0.0–0.5)
HCT: 39.2 % (ref 34.8–46.6)
HGB: 12.6 g/dL (ref 11.6–15.9)
LYMPH%: 19.9 % (ref 14.0–48.0)
MCH: 28 pg (ref 26.0–34.0)
MCHC: 32 g/dL (ref 32.0–36.0)
MCV: 87.4 fL (ref 81.0–101.0)
MONO#: 0.4 10*3/uL (ref 0.1–0.9)
MONO%: 5.5 % (ref 0.0–13.0)
NEUT#: 4.7 10*3/uL (ref 1.5–6.5)
NEUT%: 70.3 % (ref 39.6–76.8)
Platelets: 306 10*3/uL (ref 145–400)
RBC: 4.49 10*6/uL (ref 3.70–5.32)
RDW: 12.4 % (ref 11.3–14.5)
WBC: 6.7 10*3/uL (ref 3.9–10.0)
lymph#: 1.3 10*3/uL (ref 0.9–3.3)

## 2006-10-18 LAB — COMPREHENSIVE METABOLIC PANEL
ALT: 13 U/L (ref 0–35)
AST: 17 U/L (ref 0–37)
Albumin: 4.3 g/dL (ref 3.5–5.2)
Alkaline Phosphatase: 94 U/L (ref 39–117)
BUN: 14 mg/dL (ref 6–23)
CO2: 23 mEq/L (ref 19–32)
Calcium: 9.3 mg/dL (ref 8.4–10.5)
Chloride: 106 mEq/L (ref 96–112)
Creatinine, Ser: 0.91 mg/dL (ref 0.40–1.20)
Glucose, Bld: 102 mg/dL — ABNORMAL HIGH (ref 70–99)
Potassium: 4 mEq/L (ref 3.5–5.3)
Sodium: 138 mEq/L (ref 135–145)
Total Bilirubin: 0.4 mg/dL (ref 0.3–1.2)
Total Protein: 7.1 g/dL (ref 6.0–8.3)

## 2006-10-18 LAB — CANCER ANTIGEN 27.29: CA 27.29: 26 U/mL (ref 0–39)

## 2006-11-08 LAB — CBC WITH DIFFERENTIAL/PLATELET
BASO%: 1.6 % (ref 0.0–2.0)
Basophils Absolute: 0.1 10*3/uL (ref 0.0–0.1)
EOS%: 2.7 % (ref 0.0–7.0)
Eosinophils Absolute: 0.2 10*3/uL (ref 0.0–0.5)
HCT: 36.4 % (ref 34.8–46.6)
HGB: 12 g/dL (ref 11.6–15.9)
LYMPH%: 28.7 % (ref 14.0–48.0)
MCH: 28.1 pg (ref 26.0–34.0)
MCHC: 33.1 g/dL (ref 32.0–36.0)
MCV: 85 fL (ref 81.0–101.0)
MONO#: 0.4 10*3/uL (ref 0.1–0.9)
MONO%: 7 % (ref 0.0–13.0)
NEUT#: 3.4 10*3/uL (ref 1.5–6.5)
NEUT%: 60.1 % (ref 39.6–76.8)
Platelets: 355 10*3/uL (ref 145–400)
RBC: 4.28 10*6/uL (ref 3.70–5.32)
RDW: 12.4 % (ref 11.3–14.5)
WBC: 5.6 10*3/uL (ref 3.9–10.0)
lymph#: 1.6 10*3/uL (ref 0.9–3.3)

## 2006-11-08 LAB — COMPREHENSIVE METABOLIC PANEL
ALT: 22 U/L (ref 0–35)
AST: 18 U/L (ref 0–37)
Albumin: 4.2 g/dL (ref 3.5–5.2)
Alkaline Phosphatase: 109 U/L (ref 39–117)
BUN: 16 mg/dL (ref 6–23)
CO2: 26 mEq/L (ref 19–32)
Calcium: 9.2 mg/dL (ref 8.4–10.5)
Chloride: 107 mEq/L (ref 96–112)
Creatinine, Ser: 0.75 mg/dL (ref 0.40–1.20)
Glucose, Bld: 90 mg/dL (ref 70–99)
Potassium: 4.2 mEq/L (ref 3.5–5.3)
Sodium: 141 mEq/L (ref 135–145)
Total Bilirubin: 0.3 mg/dL (ref 0.3–1.2)
Total Protein: 6.8 g/dL (ref 6.0–8.3)

## 2006-11-08 LAB — CANCER ANTIGEN 27.29: CA 27.29: 24 U/mL (ref 0–39)

## 2006-11-23 ENCOUNTER — Ambulatory Visit: Payer: Self-pay | Admitting: Oncology

## 2006-11-28 LAB — CBC WITH DIFFERENTIAL/PLATELET
BASO%: 0.9 % (ref 0.0–2.0)
Basophils Absolute: 0.1 10*3/uL (ref 0.0–0.1)
EOS%: 1.8 % (ref 0.0–7.0)
Eosinophils Absolute: 0.1 10*3/uL (ref 0.0–0.5)
HCT: 40.3 % (ref 34.8–46.6)
HGB: 13.4 g/dL (ref 11.6–15.9)
LYMPH%: 25 % (ref 14.0–48.0)
MCH: 28.7 pg (ref 26.0–34.0)
MCHC: 33.2 g/dL (ref 32.0–36.0)
MCV: 86.4 fL (ref 81.0–101.0)
MONO#: 0.4 10*3/uL (ref 0.1–0.9)
MONO%: 6 % (ref 0.0–13.0)
NEUT#: 4.5 10*3/uL (ref 1.5–6.5)
NEUT%: 66.2 % (ref 39.6–76.8)
Platelets: 198 10*3/uL (ref 145–400)
RBC: 4.67 10*6/uL (ref 3.70–5.32)
RDW: 12.2 % (ref 11.3–14.5)
WBC: 6.8 10*3/uL (ref 3.9–10.0)
lymph#: 1.7 10*3/uL (ref 0.9–3.3)

## 2006-12-20 LAB — COMPREHENSIVE METABOLIC PANEL
ALT: 10 U/L (ref 0–35)
AST: 17 U/L (ref 0–37)
Albumin: 4.3 g/dL (ref 3.5–5.2)
Alkaline Phosphatase: 98 U/L (ref 39–117)
BUN: 14 mg/dL (ref 6–23)
CO2: 23 mEq/L (ref 19–32)
Calcium: 9.6 mg/dL (ref 8.4–10.5)
Chloride: 104 mEq/L (ref 96–112)
Creatinine, Ser: 0.95 mg/dL (ref 0.40–1.20)
Glucose, Bld: 92 mg/dL (ref 70–99)
Potassium: 3.7 mEq/L (ref 3.5–5.3)
Sodium: 135 mEq/L (ref 135–145)
Total Bilirubin: 0.3 mg/dL (ref 0.3–1.2)
Total Protein: 7.1 g/dL (ref 6.0–8.3)

## 2007-01-08 ENCOUNTER — Ambulatory Visit: Payer: Self-pay | Admitting: Oncology

## 2007-01-08 ENCOUNTER — Ambulatory Visit (HOSPITAL_COMMUNITY): Admission: RE | Admit: 2007-01-08 | Discharge: 2007-01-08 | Payer: Self-pay | Admitting: Oncology

## 2007-01-09 ENCOUNTER — Encounter (INDEPENDENT_AMBULATORY_CARE_PROVIDER_SITE_OTHER): Payer: Self-pay | Admitting: Cardiology

## 2007-01-09 ENCOUNTER — Ambulatory Visit: Admission: RE | Admit: 2007-01-09 | Discharge: 2007-01-09 | Payer: Self-pay | Admitting: Oncology

## 2007-01-10 LAB — CBC WITH DIFFERENTIAL/PLATELET
BASO%: 1.1 % (ref 0.0–2.0)
Basophils Absolute: 0.1 10*3/uL (ref 0.0–0.1)
EOS%: 2.6 % (ref 0.0–7.0)
Eosinophils Absolute: 0.2 10*3/uL (ref 0.0–0.5)
HCT: 40.4 % (ref 34.8–46.6)
HGB: 13.5 g/dL (ref 11.6–15.9)
LYMPH%: 30.2 % (ref 14.0–48.0)
MCH: 29.1 pg (ref 26.0–34.0)
MCHC: 33.4 g/dL (ref 32.0–36.0)
MCV: 87 fL (ref 81.0–101.0)
MONO#: 0.5 10*3/uL (ref 0.1–0.9)
MONO%: 7.8 % (ref 0.0–13.0)
NEUT#: 3.6 10*3/uL (ref 1.5–6.5)
NEUT%: 58.4 % (ref 39.6–76.8)
Platelets: 314 10*3/uL (ref 145–400)
RBC: 4.64 10*6/uL (ref 3.70–5.32)
RDW: 11.8 % (ref 11.3–14.5)
WBC: 6.2 10*3/uL (ref 3.9–10.0)
lymph#: 1.9 10*3/uL (ref 0.9–3.3)

## 2007-01-10 LAB — COMPREHENSIVE METABOLIC PANEL
ALT: 12 U/L (ref 0–35)
AST: 20 U/L (ref 0–37)
Albumin: 4.4 g/dL (ref 3.5–5.2)
Alkaline Phosphatase: 94 U/L (ref 39–117)
BUN: 10 mg/dL (ref 6–23)
CO2: 24 mEq/L (ref 19–32)
Calcium: 9.6 mg/dL (ref 8.4–10.5)
Chloride: 109 mEq/L (ref 96–112)
Creatinine, Ser: 0.96 mg/dL (ref 0.40–1.20)
Glucose, Bld: 98 mg/dL (ref 70–99)
Potassium: 3.6 mEq/L (ref 3.5–5.3)
Sodium: 143 mEq/L (ref 135–145)
Total Bilirubin: 0.6 mg/dL (ref 0.3–1.2)
Total Protein: 7.2 g/dL (ref 6.0–8.3)

## 2007-01-10 LAB — CANCER ANTIGEN 27.29: CA 27.29: 27 U/mL (ref 0–39)

## 2007-01-10 LAB — LACTATE DEHYDROGENASE: LDH: 179 U/L (ref 94–250)

## 2007-01-26 IMAGING — CT CT HEAD WO/W CM
3 of 7 series · 15 of 47 positions shown, 18 images · IV contrast (omnipaque)
Comparison: 05/03/2006

HEAD CT WITHOUT AND WITH CONTRAST:

CLINICAL DATA: Breast cancer. headaches and left chest pain
TECHNIQUE: 5mm collimated images were obtained from the base of the skull
through the vertex according to standard protocol before and after
administration of intravenous contrast.

Contrast:   125 cc Omnipaque 300
TECHNIQUE: Multidetector CT imaging of the chest, abdomen and pelvis was
performed following the standard protocol during bolus administration of
intravenous contrast.

[Series 5: cap 5.0 b40f · axial · 0.68mm/px · z∈[-776,-302]mm · 9 of 115 slices shown, 12 images]
[im 10/115  brain]
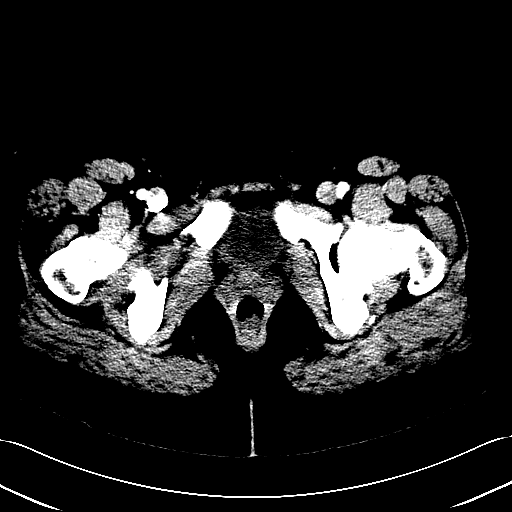
[im 10/115  bone]
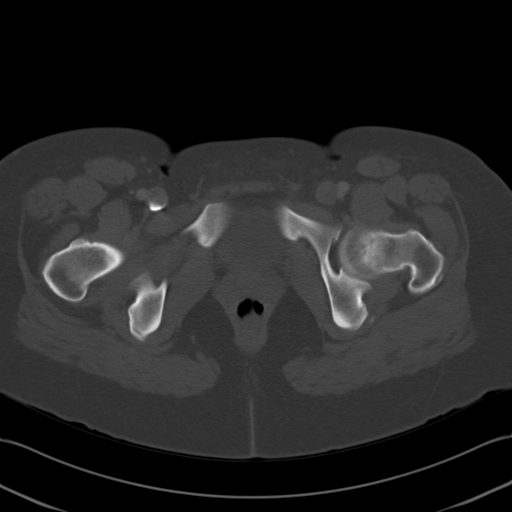
[im 20/115  brain]
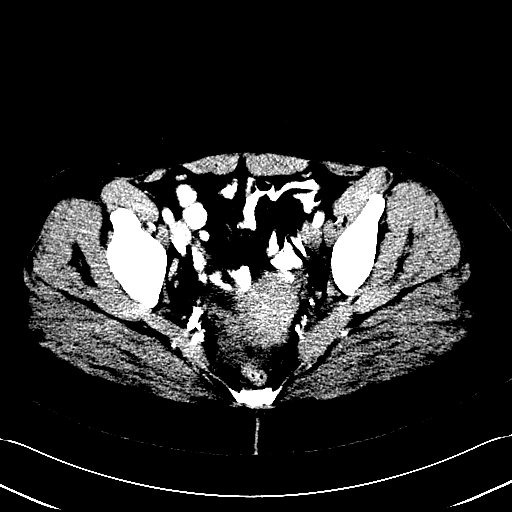
[im 39/115  brain]
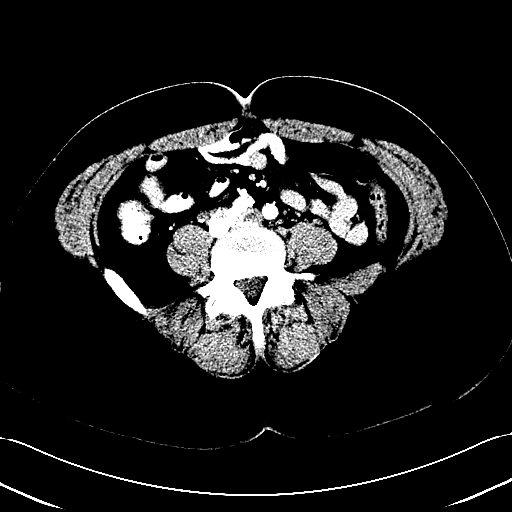
[im 48/115  brain]
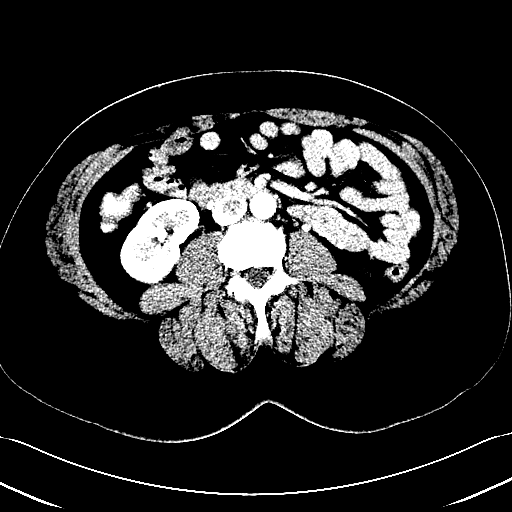
[im 58/115  brain]
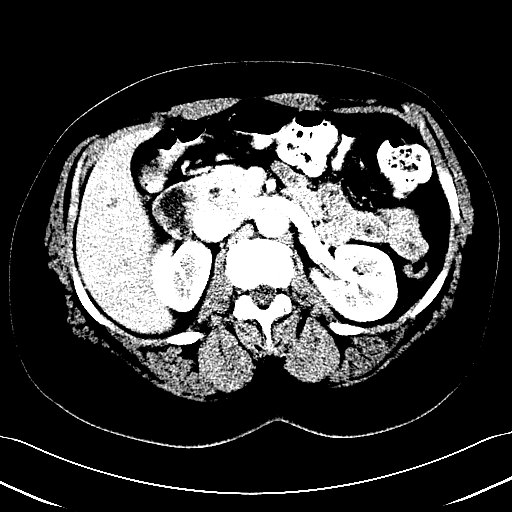
[im 58/115  bone]
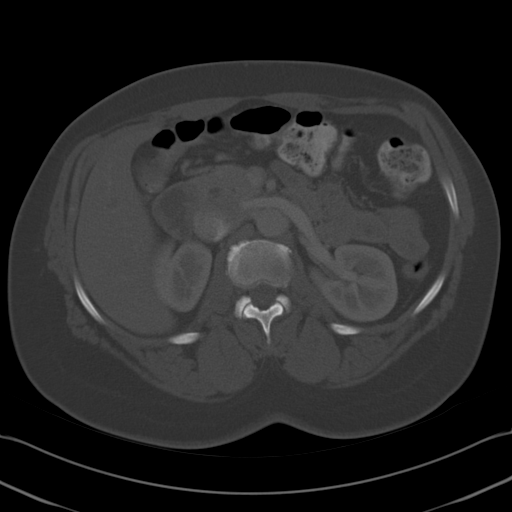
[im 67/115  brain]
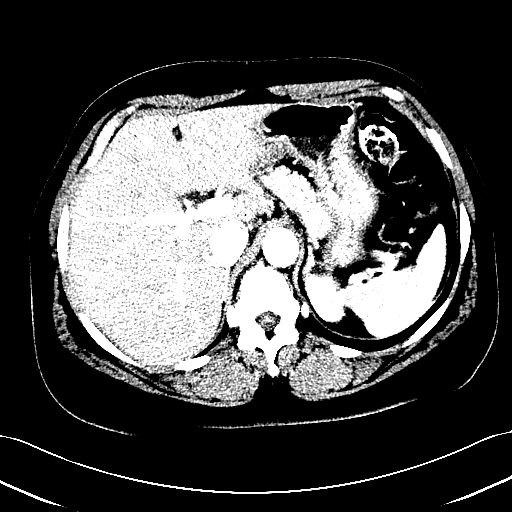
[im 77/115  brain]
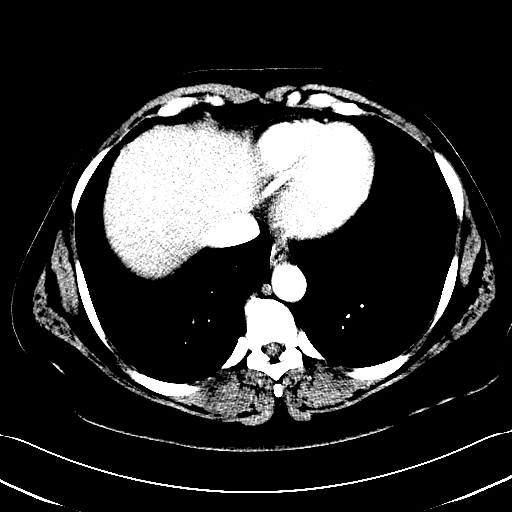
[im 96/115  brain]
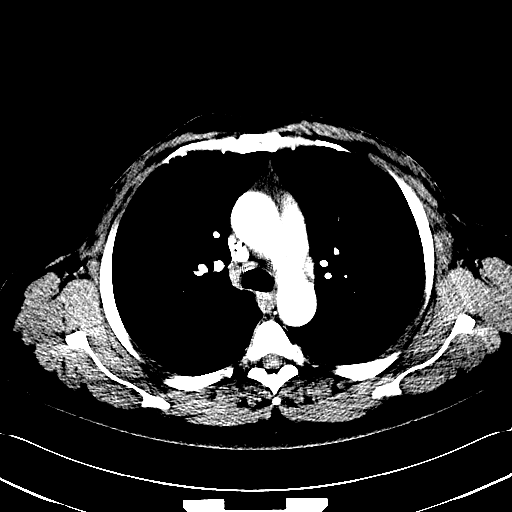
[im 105/115  brain]
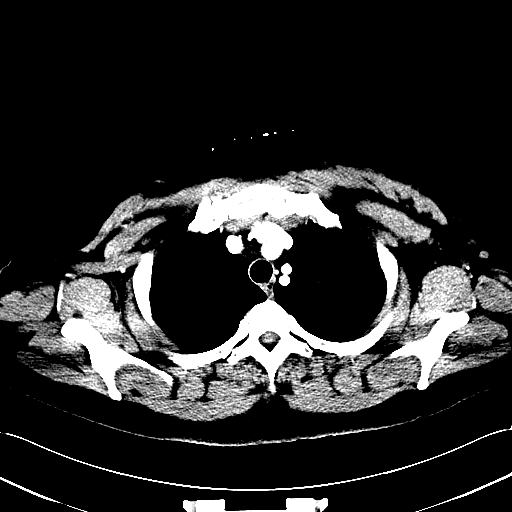
[im 105/115  bone]
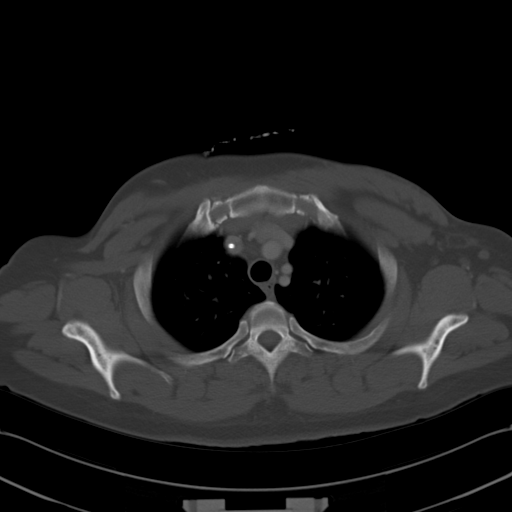

[Series 6: mpr coronal cap · coronal · 1.17mm/px · 3 of 69 slices shown]
[im 23/69  brain]
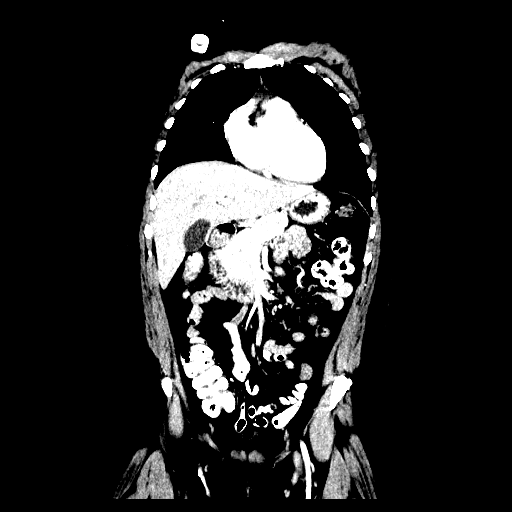
[im 31/69  brain]
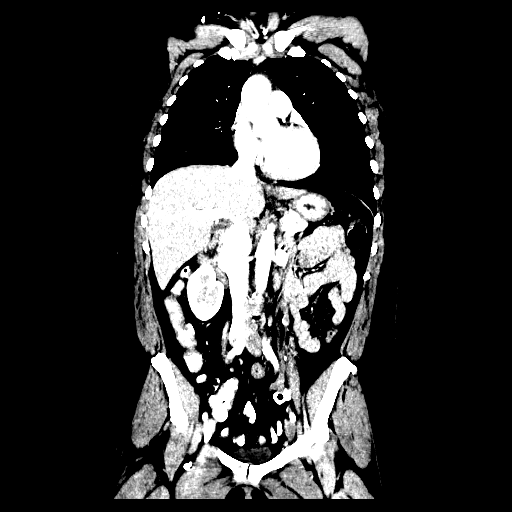
[im 38/69  brain]
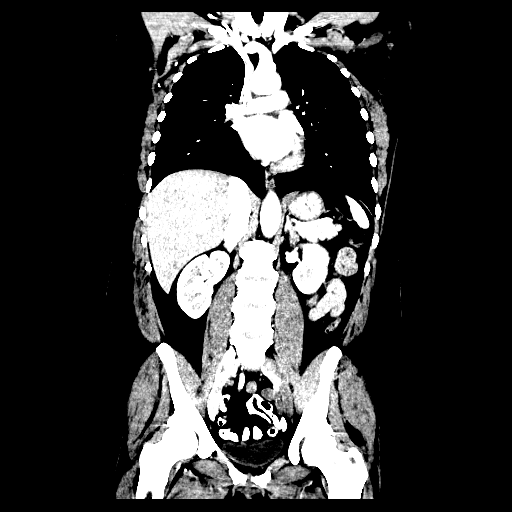

[Series 7: mpr sagittal cap · sagittal · 1.23mm/px · 3 of 94 slices shown]
[im 32/94  brain]
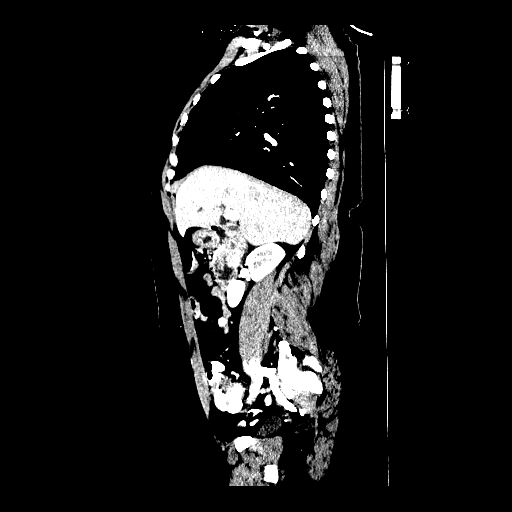
[im 47/94  brain]
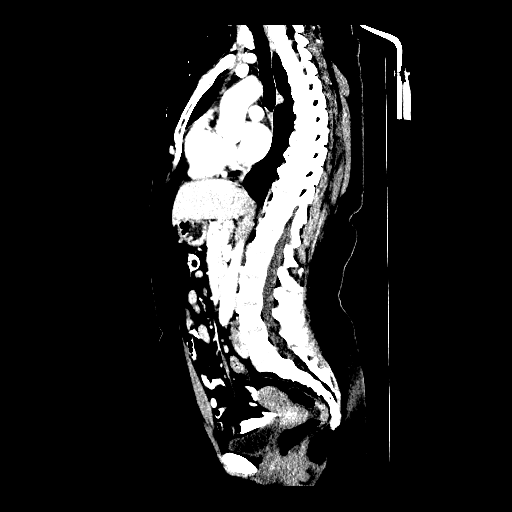
[im 63/94  brain]
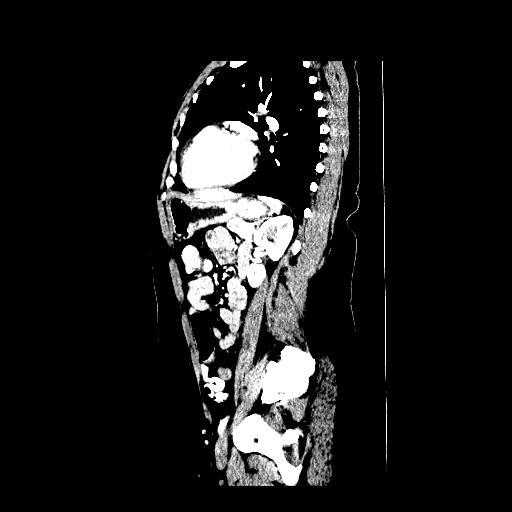

[15 of 47 positions shown; findings below may reference images not displayed]

FINDINGS: There is no evidence for acute hemorrhage, hydrocephalus,
mass/mass-effect or abnormal extra-axial fluid collection.  No definite CT
evidence for acute ischemia. Imaging after IV contrast administration
demonstrates no abnormal parenchymal enhancement. The visualized paranasal
sinuses are clear.

IMPRESSION

No acute findings. No evidence for intracranial metastatic disease.

CHEST CT WITH CONTRAST:
FINDINGS: Right-sided portacatheter noted in situ. Patient is status post
bilateral mastectomy. Surgical clips are seen in the right axilla. No evidence
for axillary, mediastinal, or hilar lymphadenopathy. There is no pericardial or
pleural effusion.

Lung windows show no focal consolidation. Interstitial coarsening of anterior
right upper and middle lobes is likely related to treatment. There is an
extremely tiny 2 mm nodule in the posteromedial right lower lobe on image 32,
stable and unchanged when compared back to 05/11/2005.
IMPRESSION: Stable exam. No evidence for metastatic disease.

ABDOMEN CT WITH CONTRAST:
FINDINGS: 7 mm low density lesion in the inferior right liver is stable
compared back to 05/11/2005. No other focal abnormality identified in the liver or
spleen. The stomach, duodenum, pancreas, gallbladder, adrenal glands, and
kidneys have normal imaging features. No intraperitoneal free fluid. No
abdominal lymphadenopathy.
IMPRESSION: Stable. No evidence for metastatic involvement.

PELVIS CT WITH CONTRAST:
FINDINGS: No evidence for lymphadenopathy. Left ovary is mildly prominent, but
unchanged. There is no evidence for a right adnexal mass. The appendix and
terminal ileum have normal features.

There is intravenous contrast in the right iliac system consistent with a right
foot injection. This results in laminar flow of unopacified blood in the
inferior vena cava, mainly related to unopacified inflow from the left
hemipelvis. Very similar appearance was noted on the previous exam.
IMPRESSION: No evidence for metastatic involvement in the anatomic pelvis.

## 2007-01-31 LAB — CBC WITH DIFFERENTIAL/PLATELET
BASO%: 1.5 % (ref 0.0–2.0)
Basophils Absolute: 0.1 10*3/uL (ref 0.0–0.1)
EOS%: 1.9 % (ref 0.0–7.0)
Eosinophils Absolute: 0.1 10*3/uL (ref 0.0–0.5)
HCT: 43.4 % (ref 34.8–46.6)
HGB: 14.2 g/dL (ref 11.6–15.9)
LYMPH%: 28.7 % (ref 14.0–48.0)
MCH: 28.7 pg (ref 26.0–34.0)
MCHC: 32.7 g/dL (ref 32.0–36.0)
MCV: 87.7 fL (ref 81.0–101.0)
MONO#: 0.4 10*3/uL (ref 0.1–0.9)
MONO%: 7.1 % (ref 0.0–13.0)
NEUT#: 3.7 10*3/uL (ref 1.5–6.5)
NEUT%: 60.9 % (ref 39.6–76.8)
Platelets: 238 10*3/uL (ref 145–400)
RBC: 4.95 10*6/uL (ref 3.70–5.32)
RDW: 12.1 % (ref 11.3–14.5)
WBC: 6 10*3/uL (ref 3.9–10.0)
lymph#: 1.7 10*3/uL (ref 0.9–3.3)

## 2007-02-15 ENCOUNTER — Ambulatory Visit (HOSPITAL_COMMUNITY): Admission: RE | Admit: 2007-02-15 | Discharge: 2007-02-15 | Payer: Self-pay | Admitting: Hematology and Oncology

## 2007-02-21 LAB — COMPREHENSIVE METABOLIC PANEL
ALT: 11 U/L (ref 0–35)
AST: 15 U/L (ref 0–37)
Albumin: 4.4 g/dL (ref 3.5–5.2)
Alkaline Phosphatase: 93 U/L (ref 39–117)
BUN: 13 mg/dL (ref 6–23)
CO2: 24 mEq/L (ref 19–32)
Calcium: 9.2 mg/dL (ref 8.4–10.5)
Chloride: 109 mEq/L (ref 96–112)
Creatinine, Ser: 0.74 mg/dL (ref 0.40–1.20)
Glucose, Bld: 94 mg/dL (ref 70–99)
Potassium: 3.8 mEq/L (ref 3.5–5.3)
Sodium: 140 mEq/L (ref 135–145)
Total Bilirubin: 0.4 mg/dL (ref 0.3–1.2)
Total Protein: 7 g/dL (ref 6.0–8.3)

## 2007-02-21 LAB — CBC WITH DIFFERENTIAL/PLATELET
BASO%: 1.7 % (ref 0.0–2.0)
Basophils Absolute: 0.1 10*3/uL (ref 0.0–0.1)
EOS%: 2.4 % (ref 0.0–7.0)
Eosinophils Absolute: 0.1 10*3/uL (ref 0.0–0.5)
HCT: 41.1 % (ref 34.8–46.6)
HGB: 13.6 g/dL (ref 11.6–15.9)
LYMPH%: 31 % (ref 14.0–48.0)
MCH: 28.3 pg (ref 26.0–34.0)
MCHC: 33.1 g/dL (ref 32.0–36.0)
MCV: 85.5 fL (ref 81.0–101.0)
MONO#: 0.4 10*3/uL (ref 0.1–0.9)
MONO%: 7.4 % (ref 0.0–13.0)
NEUT#: 2.9 10*3/uL (ref 1.5–6.5)
NEUT%: 57.4 % (ref 39.6–76.8)
Platelets: 268 10*3/uL (ref 145–400)
RBC: 4.81 10*6/uL (ref 3.70–5.32)
RDW: 11.9 % (ref 11.3–14.5)
WBC: 5.1 10*3/uL (ref 3.9–10.0)
lymph#: 1.6 10*3/uL (ref 0.9–3.3)

## 2007-02-21 LAB — LACTATE DEHYDROGENASE: LDH: 147 U/L (ref 94–250)

## 2007-02-21 LAB — CANCER ANTIGEN 27.29: CA 27.29: 28 U/mL (ref 0–39)

## 2007-02-26 ENCOUNTER — Emergency Department (HOSPITAL_COMMUNITY): Admission: EM | Admit: 2007-02-26 | Discharge: 2007-02-26 | Payer: Self-pay | Admitting: Family Medicine

## 2007-03-12 ENCOUNTER — Ambulatory Visit: Payer: Self-pay | Admitting: Oncology

## 2007-03-14 LAB — COMPREHENSIVE METABOLIC PANEL
ALT: 9 U/L (ref 0–35)
AST: 15 U/L (ref 0–37)
Albumin: 4.4 g/dL (ref 3.5–5.2)
Alkaline Phosphatase: 98 U/L (ref 39–117)
BUN: 13 mg/dL (ref 6–23)
CO2: 24 mEq/L (ref 19–32)
Calcium: 9.3 mg/dL (ref 8.4–10.5)
Chloride: 107 mEq/L (ref 96–112)
Creatinine, Ser: 1.09 mg/dL (ref 0.40–1.20)
Glucose, Bld: 92 mg/dL (ref 70–99)
Potassium: 4.2 mEq/L (ref 3.5–5.3)
Sodium: 140 mEq/L (ref 135–145)
Total Bilirubin: 0.2 mg/dL — ABNORMAL LOW (ref 0.3–1.2)
Total Protein: 7.2 g/dL (ref 6.0–8.3)

## 2007-03-14 LAB — CBC WITH DIFFERENTIAL/PLATELET
BASO%: 1.5 % (ref 0.0–2.0)
Basophils Absolute: 0.1 10*3/uL (ref 0.0–0.1)
EOS%: 3.3 % (ref 0.0–7.0)
Eosinophils Absolute: 0.2 10*3/uL (ref 0.0–0.5)
HCT: 37.9 % (ref 34.8–46.6)
HGB: 12.7 g/dL (ref 11.6–15.9)
LYMPH%: 26.9 % (ref 14.0–48.0)
MCH: 28.9 pg (ref 26.0–34.0)
MCHC: 33.6 g/dL (ref 32.0–36.0)
MCV: 86 fL (ref 81.0–101.0)
MONO#: 0.3 10*3/uL (ref 0.1–0.9)
MONO%: 4.8 % (ref 0.0–13.0)
NEUT#: 4 10*3/uL (ref 1.5–6.5)
NEUT%: 63.6 % (ref 39.6–76.8)
Platelets: 223 10*3/uL (ref 145–400)
RBC: 4.41 10*6/uL (ref 3.70–5.32)
RDW: 12 % (ref 11.3–14.5)
WBC: 6.4 10*3/uL (ref 3.9–10.0)
lymph#: 1.7 10*3/uL (ref 0.9–3.3)

## 2007-03-19 ENCOUNTER — Emergency Department (HOSPITAL_COMMUNITY): Admission: EM | Admit: 2007-03-19 | Discharge: 2007-03-19 | Payer: Self-pay | Admitting: Emergency Medicine

## 2007-04-04 LAB — COMPREHENSIVE METABOLIC PANEL
ALT: 15 U/L (ref 0–35)
AST: 18 U/L (ref 0–37)
Albumin: 4.2 g/dL (ref 3.5–5.2)
Alkaline Phosphatase: 97 U/L (ref 39–117)
BUN: 12 mg/dL (ref 6–23)
CO2: 24 mEq/L (ref 19–32)
Calcium: 9.4 mg/dL (ref 8.4–10.5)
Chloride: 108 mEq/L (ref 96–112)
Creatinine, Ser: 0.99 mg/dL (ref 0.40–1.20)
Glucose, Bld: 107 mg/dL — ABNORMAL HIGH (ref 70–99)
Potassium: 3.3 mEq/L — ABNORMAL LOW (ref 3.5–5.3)
Sodium: 141 mEq/L (ref 135–145)
Total Bilirubin: 0.5 mg/dL (ref 0.3–1.2)
Total Protein: 7 g/dL (ref 6.0–8.3)

## 2007-04-04 LAB — CBC WITH DIFFERENTIAL/PLATELET
BASO%: 2 % (ref 0.0–2.0)
Basophils Absolute: 0.1 10*3/uL (ref 0.0–0.1)
EOS%: 3 % (ref 0.0–7.0)
Eosinophils Absolute: 0.2 10*3/uL (ref 0.0–0.5)
HCT: 37.9 % (ref 34.8–46.6)
HGB: 12.8 g/dL (ref 11.6–15.9)
LYMPH%: 26.2 % (ref 14.0–48.0)
MCH: 29.1 pg (ref 26.0–34.0)
MCHC: 33.7 g/dL (ref 32.0–36.0)
MCV: 86.4 fL (ref 81.0–101.0)
MONO#: 0.5 10*3/uL (ref 0.1–0.9)
MONO%: 7.7 % (ref 0.0–13.0)
NEUT#: 3.7 10*3/uL (ref 1.5–6.5)
NEUT%: 61 % (ref 39.6–76.8)
Platelets: 249 10*3/uL (ref 145–400)
RBC: 4.39 10*6/uL (ref 3.70–5.32)
RDW: 11.9 % (ref 11.3–14.5)
WBC: 6 10*3/uL (ref 3.9–10.0)
lymph#: 1.6 10*3/uL (ref 0.9–3.3)

## 2007-04-04 LAB — CANCER ANTIGEN 27.29: CA 27.29: 25 U/mL (ref 0–39)

## 2007-04-04 LAB — LACTATE DEHYDROGENASE: LDH: 165 U/L (ref 94–250)

## 2007-04-18 ENCOUNTER — Ambulatory Visit (HOSPITAL_COMMUNITY): Admission: RE | Admit: 2007-04-18 | Discharge: 2007-04-18 | Payer: Self-pay | Admitting: Oncology

## 2007-04-20 ENCOUNTER — Ambulatory Visit (HOSPITAL_COMMUNITY): Admission: RE | Admit: 2007-04-20 | Discharge: 2007-04-20 | Payer: Self-pay | Admitting: Oncology

## 2007-04-21 ENCOUNTER — Emergency Department (HOSPITAL_COMMUNITY): Admission: EM | Admit: 2007-04-21 | Discharge: 2007-04-21 | Payer: Self-pay | Admitting: Family Medicine

## 2007-04-23 ENCOUNTER — Ambulatory Visit: Payer: Self-pay

## 2007-04-23 ENCOUNTER — Encounter: Payer: Self-pay | Admitting: Oncology

## 2007-04-25 LAB — CBC WITH DIFFERENTIAL/PLATELET
BASO%: 1.3 % (ref 0.0–2.0)
Basophils Absolute: 0.1 10*3/uL (ref 0.0–0.1)
EOS%: 4 % (ref 0.0–7.0)
Eosinophils Absolute: 0.2 10*3/uL (ref 0.0–0.5)
HCT: 39.5 % (ref 34.8–46.6)
HGB: 13.2 g/dL (ref 11.6–15.9)
LYMPH%: 28 % (ref 14.0–48.0)
MCH: 28.7 pg (ref 26.0–34.0)
MCHC: 33.5 g/dL (ref 32.0–36.0)
MCV: 85.8 fL (ref 81.0–101.0)
MONO#: 0.5 10*3/uL (ref 0.1–0.9)
MONO%: 8.5 % (ref 0.0–13.0)
NEUT#: 3.5 10*3/uL (ref 1.5–6.5)
NEUT%: 58.2 % (ref 39.6–76.8)
Platelets: 247 10*3/uL (ref 145–400)
RBC: 4.61 10*6/uL (ref 3.70–5.32)
RDW: 12 % (ref 11.3–14.5)
WBC: 6 10*3/uL (ref 3.9–10.0)
lymph#: 1.7 10*3/uL (ref 0.9–3.3)

## 2007-05-02 ENCOUNTER — Emergency Department (HOSPITAL_COMMUNITY): Admission: EM | Admit: 2007-05-02 | Discharge: 2007-05-02 | Payer: Self-pay | Admitting: Emergency Medicine

## 2007-05-15 ENCOUNTER — Emergency Department (HOSPITAL_COMMUNITY): Admission: EM | Admit: 2007-05-15 | Discharge: 2007-05-15 | Payer: Self-pay | Admitting: Emergency Medicine

## 2007-05-24 IMAGING — PT NM PET TUM IMG SKULL BASE T - THIGH
6 series · 25 of 25 positions shown · non-contrast
Comparison: none

CLINICAL DATA: Bilateral breast cancer.  Status post bilateral mastectomy and portacath placement.
FDG PET-CT TUMOR IMAGING (SKULL BASE TO THIGHS) ? 01/08/07: 
Fasting Blood Glucose:  109.
TECHNIQUE: 17.3 mCi F18-FDG were administered via the left foot.  Full ring PET imaging was performed from the skull base through the mid-thighs 65 minutes after injection.  CT data was obtained and used for attenuation correction and anatomic localization only.  (This was not acquired as a diagnostic CT examination.)

[Series 1: pet ac · axial · 3.3mm · 4.69mm/px · z∈[-726,+0]mm · 5 of 223 slices shown]
[im 1/223]
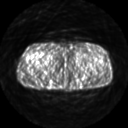
[im 56/223]
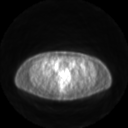
[im 112/223]
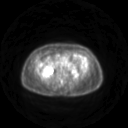
[im 167/223]
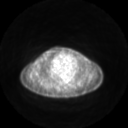
[im 223/223]
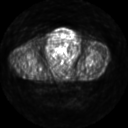

[Series 2: ct images · axial · 3.8mm · 0.98mm/px · z∈[-726,+0]mm · 6 of 220 slices shown]
[im 1/220]
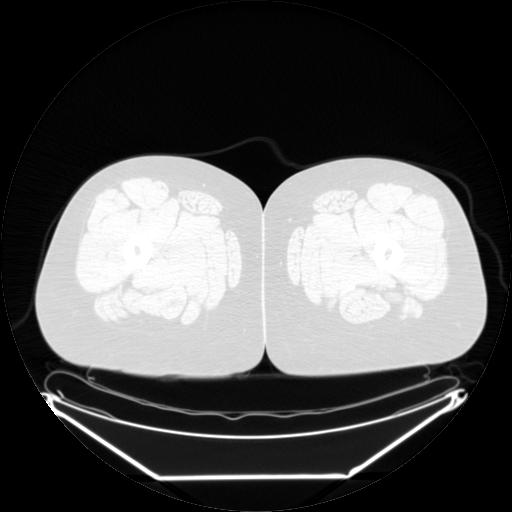
[im 44/220]
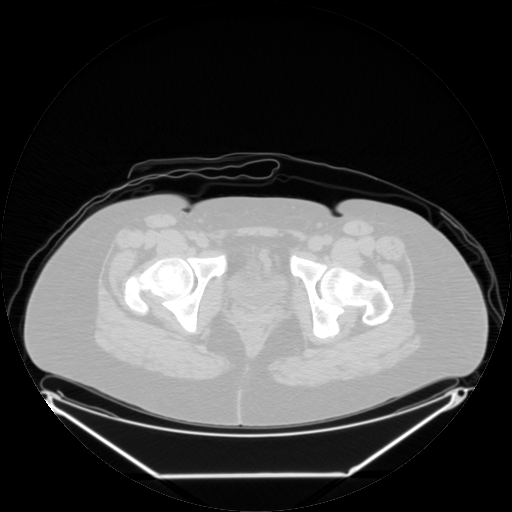
[im 88/220]
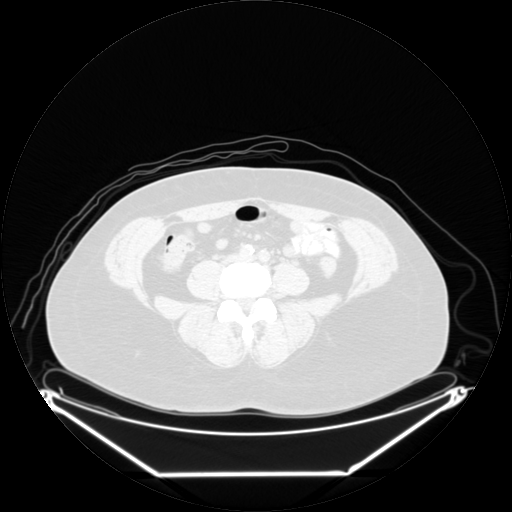
[im 132/220]
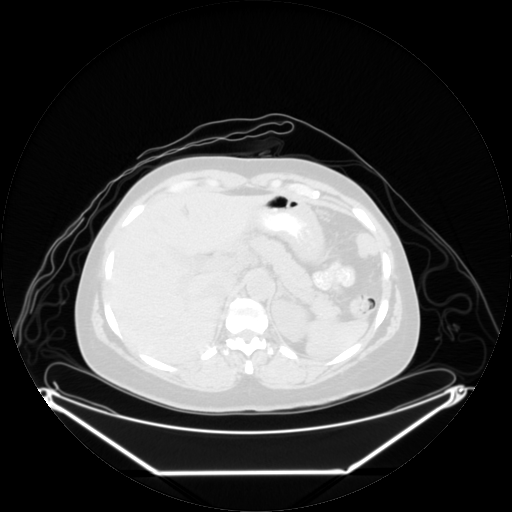
[im 176/220]
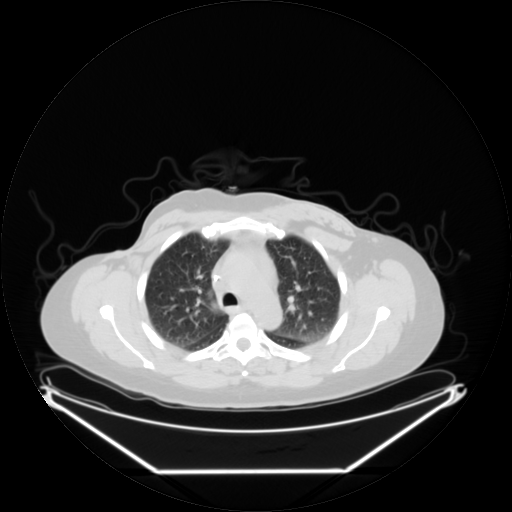
[im 220/220  brain]
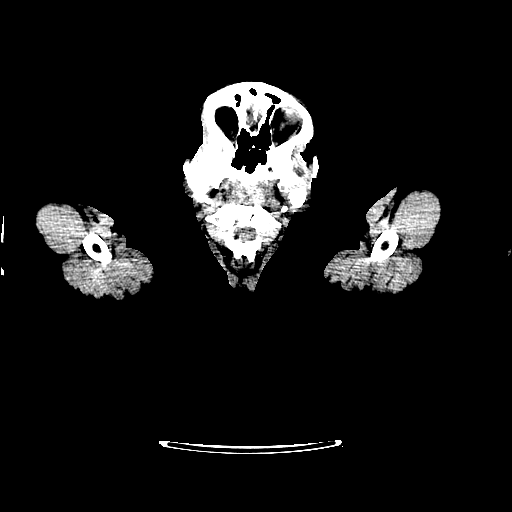

[Series 2: pet nac · axial · 3.3mm · 4.69mm/px · z∈[-726,+0]mm · 6 of 223 slices shown]
[im 1/223]
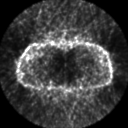
[im 45/223]
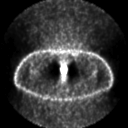
[im 89/223]
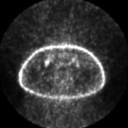
[im 134/223]
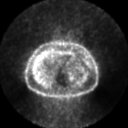
[im 178/223]
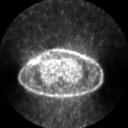
[im 223/223]
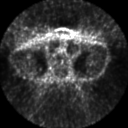

[Series 123: mip · coronal · 3.3mm · 4.69mm/px · 1 of 30 slices shown]
[im 1/30]
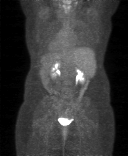

[Series 152: reformatted · axial · 3.3mm · 3.91mm/px · z∈[-667,-16]mm · 5 of 198 slices shown (1 of 2)]
[im 1/198]
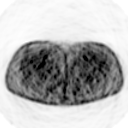
[im 50/198]
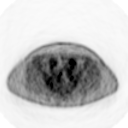
[im 99/198]
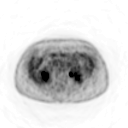
[im 148/198]
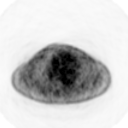
[im 198/198]
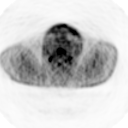

[Series 153: reformatted · coronal · 4.7mm · 5.83mm/px · 2 of 64 slices shown (2 of 2)]
[im 1/64]
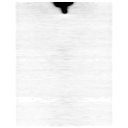
[im 64/64]
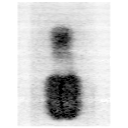

[25 of 25 positions shown; findings below may reference images not displayed]

FINDINGS: Within bilateral cervical lymph node chains, there are two foci of moderate increased radiotracer uptake which are indeterminate for hypermetabolic lymph node metastases.  For reference purposes, within the right posterior cervical lymph node chain, the hypermetabolic focus has an SUV-max equal to 5.3 (image #5).  
There is no hypermetabolic axillary, mediastinal, or hilar adenopathy.  
  There are no hypermetabolic pulmonary nodules or masses.  
  A 4 mm nodule in the right lower lobe is too small to characterize by PET.
IMPRESSION: 1.  There is no evidence for hypermetabolic metastases within the chest, abdomen, or pelvis.
2.  Within the soft tissues of the neck, along bilateral posterior cervical lymph nodes chains, there are two foci which have moderate increased uptake.  On the right, this focus localizes to a 0.9 x 0.8 cm lymph node (images #6 and #7).  This is new when compared with the most recent PET/CT from  09/12/06.  This is an indeterminate finding for lymph node metastasis.  Possible management strategies would include a contrast-enhanced MRI of the soft tissues of the neck or CT of the soft tissues of the neck.  Alternatively, attention at follow-up examination is recommended.

## 2007-05-29 ENCOUNTER — Encounter: Payer: Self-pay | Admitting: Oncology

## 2007-05-29 ENCOUNTER — Ambulatory Visit: Admission: RE | Admit: 2007-05-29 | Discharge: 2007-05-29 | Payer: Self-pay | Admitting: Oncology

## 2007-06-05 ENCOUNTER — Ambulatory Visit: Payer: Self-pay | Admitting: Oncology

## 2007-07-01 IMAGING — CT CT CHEST W/ CM
4 of 9 series · 14 of 36 positions shown, 16 images · IV contrast (omnipaque)
Comparison: 09/12/2006
COMPARISON: 11/09/2005
COMPARISON: 11/09/2005 and 01/08/2007
COMPARISON: 01/08/2007

CLINICAL DATA: Breast cancer

HEAD CT WITHOUT AND WITH CONTRAST
TECHNIQUE: 5mm collimated images were obtained from the base of the skull
through the vertex according to standard protocol before and after
administration of intravenous contrast.
Contrast:  125 cc Omnipaque 300
TECHNIQUE: Multidetector CT imaging of the neck was performed following the
standard protocol during administration of intravenous contrast.
TECHNIQUE: Multidetector CT imaging of the chest was performed following the
standard protocol during bolus administration of intravenous contrast.
TECHNIQUE: 17.4 mCi F-18 FDG was injected intravenously via the right foot . 
Full-ring PET imaging was performed from the skull base through the mid-thighs
55 minutes after injection.  CT data was obtained and used for attenuation
correction and anatomic localization only.  (This was not acquired as a
diagnostic CT examination.)

[Series 5: chest routine 5.0 b40f · axial · 0.66mm/px · z∈[+830,+1030]mm · 5 of 61 slices shown, 7 images]
[im 11/61  mediastinal]
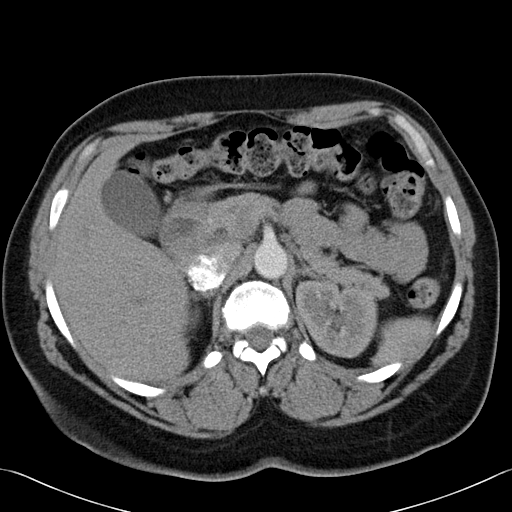
[im 11/61  lung]
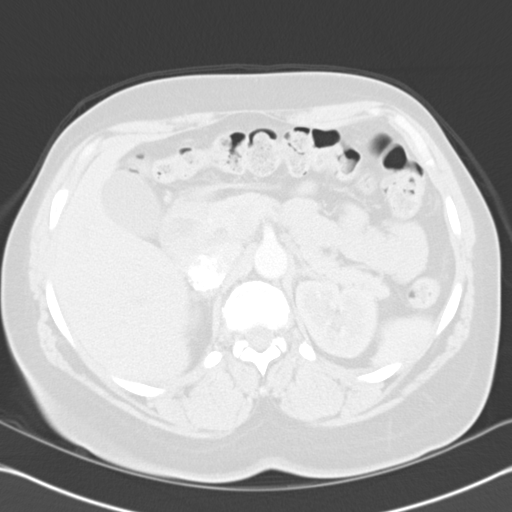
[im 21/61  lung]
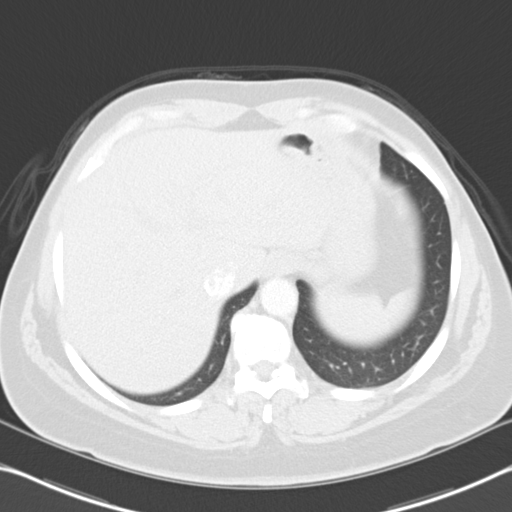
[im 31/61  lung]
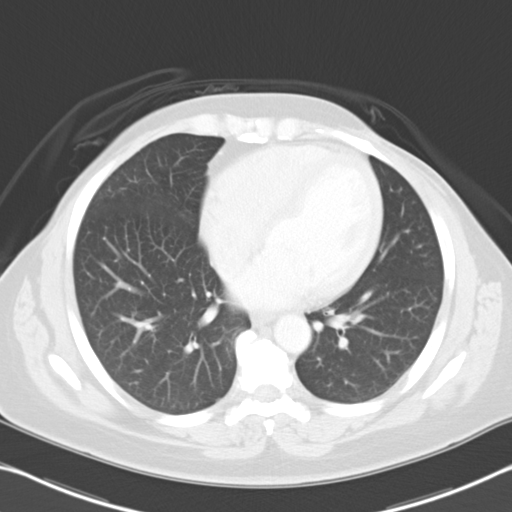
[im 41/61  lung]
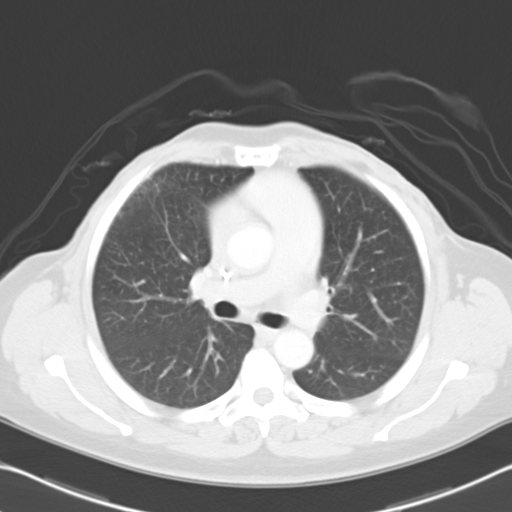
[im 51/61  mediastinal]
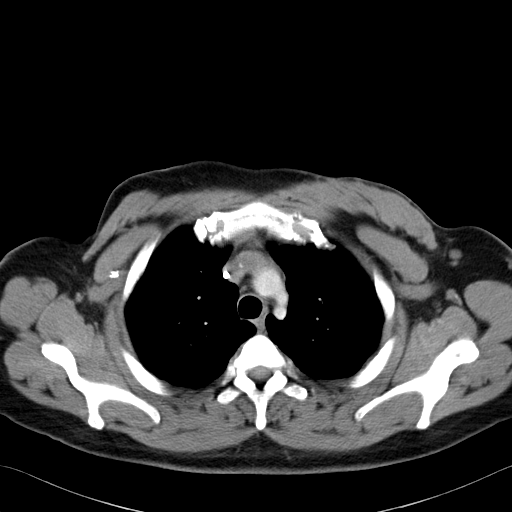
[im 51/61  lung]
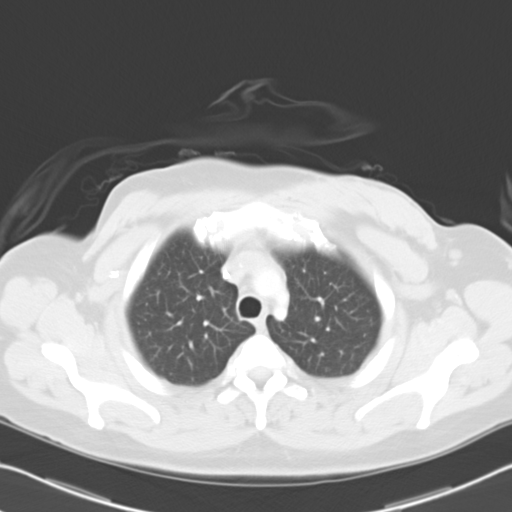

[Series 8: chest routine 5.0 b70f · axial · 0.66mm/px · z∈[+895,+1030]mm · 4 of 47 slices shown]
[im 10/47  lung]
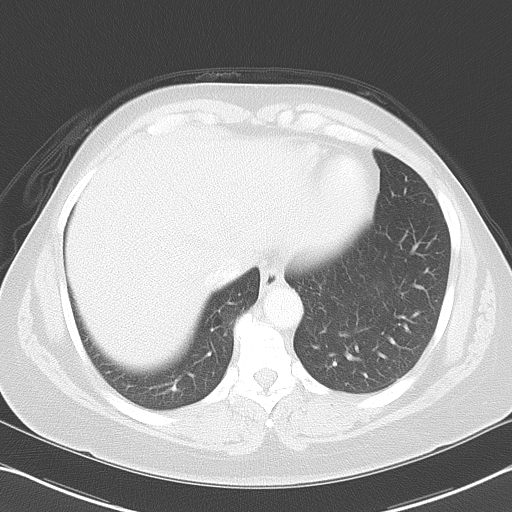
[im 19/47  lung]
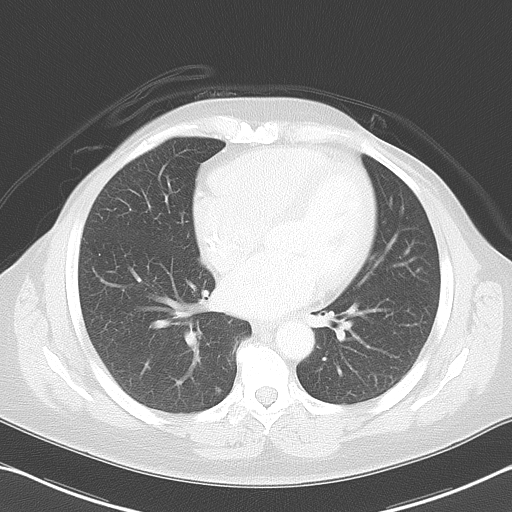
[im 28/47  lung]
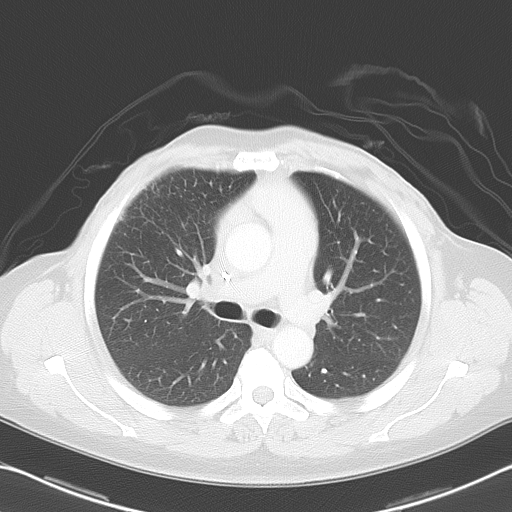
[im 37/47  lung]
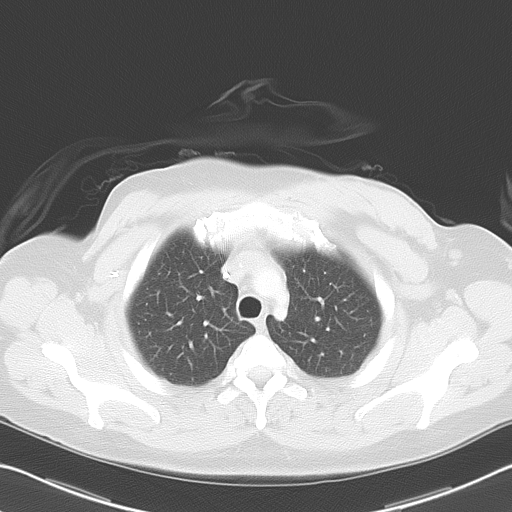

[Series 10: neck 3.0 b40s · axial · 0.39mm/px · z∈[+1050,+1140]mm · 4 of 61 slices shown]
[im 11/61  lung]
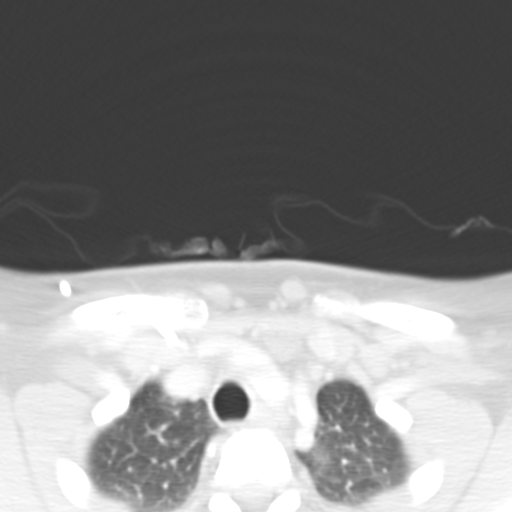
[im 21/61  lung]
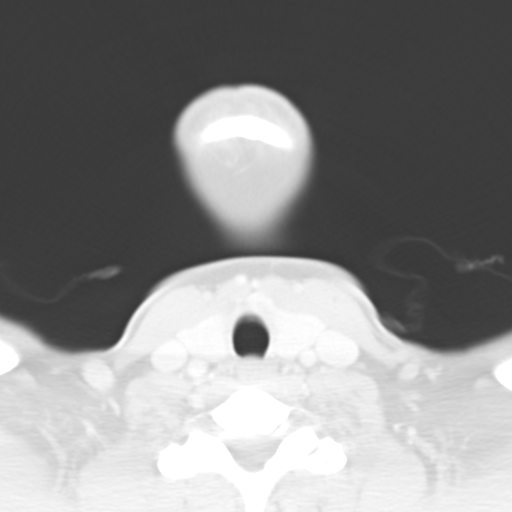
[im 31/61  lung]
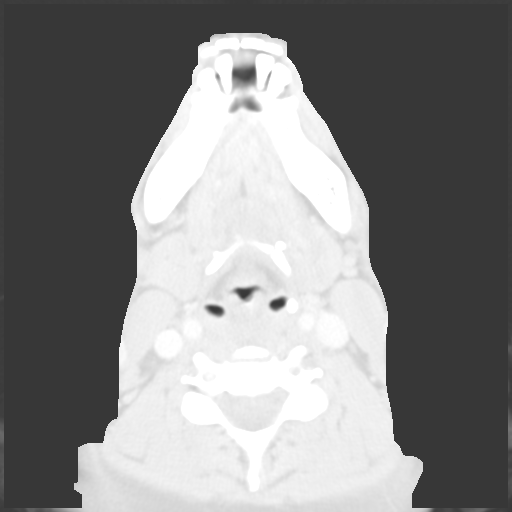
[im 41/61  lung]
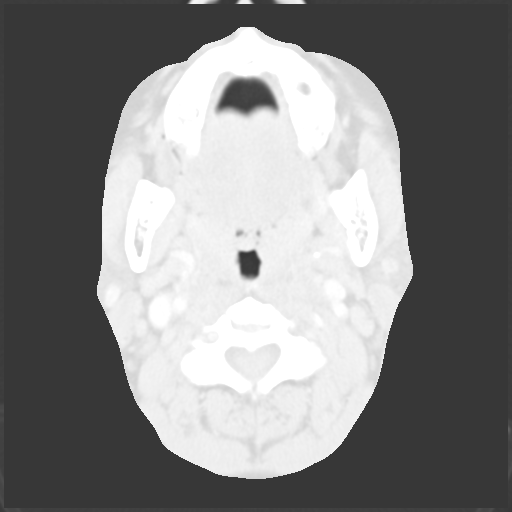

[Series 602: <mpr thick range> · coronal · 0.66mm/px · 1 of 68 slices shown]
[im 34/68  lung]
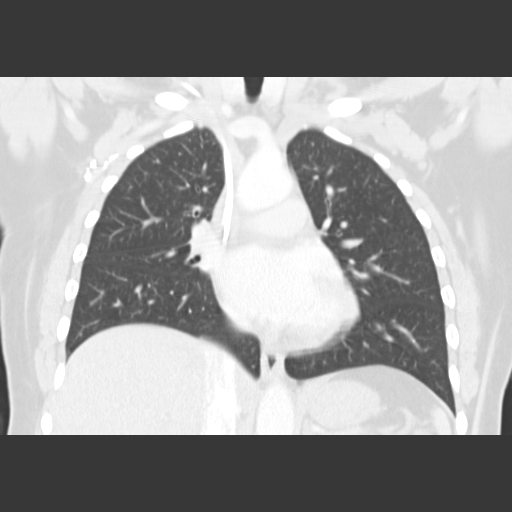

[14 of 36 positions shown; findings below may reference images not displayed]

FINDINGS: The cerebral and cerebellar hemispheres are normal in attenuation and
morphology.

The midline is maintained there is no edema or mass-effect.

No abnormal extra-axial fluid collection, intracranial hemorrhage or masses
noted.

The midline is maintained.

On the postcontrast images no abnormal areas of enhancement are noted to suggest
brain metastasis.

The mastoid air cells and paranasal sinuses are normally aerated.

No focal lytic or sclerotic lesions are noted.

IMPRESSION

1. No evidence for brain metastasis 

NECK CT WITH CONTRAST
FINDINGS: Left jugular digastric lymph node measures 15.2 mm x 10.8 mm, image
25. This is unchanged compared to prior exam.

Right jugulodigastric lymph node measures 13.6 x 12.6 mm, image 25. This is
unchanged compared to prior exam. 

No enhancing masses are identified. 

The vascular structures of the soft tissues of the neck are patent.

IMPRESSION

1.  Stable, nonspecific enlarged bilateral jugulodigastric lymph nodes.
2.  No new findings.

CHEST CT WITH CONTRAST
FINDINGS: No enlarged axillary lymph nodes.

The patient is status post bilateral mastectomy and right axillary lymph node
dissection.

Stable non pathologically enlarged left axillary lymph nodes.

No mediastinal or hilar lymphadenopathy.

No pericardial or pleural effusions.

4 mm nodule within the superior segment of the right lower lobe is again noted,
image 29. This is stable compared to prior exam.

No new pulmonary nodules or masses noted.

Review of the bone windows shows mild thoracic spondylosis.

No lytic or sclerotic lesions are identified.

Within the inferior aspect of the right lobe of liver there is a focal area of
low density which measures 8.6 mm, image 54. On the previous CT scan this
measured 7.0 mm and on the CT from 11/13/2005 this measured 6.3 mm.

IMPRESSION

1. Stable CT of the chest.

2. There is a focal area of low density within the right lobe of liver. This
minimally increased in size when compared to the exam from a 01/08/07 and is
increased by 2mm when compared with the exam from 11/09/2005. Although liver
metastasis is considered less likely, careful clinical followup of this lesion
is recommended.

FDG PET-CT TUMOR IMAGING (SKULL BASE TO THIGHS):

Fasting Blood Glucose:  107
FINDINGS: Again seen is mild nonspecific increased radiotracer uptake within
bilateral enlarged jugulodigastric lymph nodes. The SUV mass within the lymph
node on the right side of the neck measures 4.4. This is compared with
previously

No hypermetabolic axillary lymph nodes.

Cardiac enlargement.

No hypermetabolic mediastinal or hilar lymph nodes.

No abnormal increased FDG uptake is seen within the liver parenchyma.
Specifically, there is no abnormal uptake in the region of the small 8 mm low
density focus in the right lobe of liver.

Spleen is negative.

Pancreas is negative.

Adrenal glands negative.

No hypermetabolic pelvic or renal lymph nodes.

Urinary bladder is negative.

The uterus and adnexal structures are unremarkable.

No abnormal foci of increased radiotracer uptake is seen within the visualized
axial or appendicular skeleton.
IMPRESSION: 1. No evidence for hypermetabolic metastasis within the chest, abdomen, or
pelvis.
2. No significant change in nonspecific increased uptake within the bilateral
cervical lymph nodes. These remain indeterminant. Given their stability however
I favor a benign etiology such as inflammatory or reactive change. Continued
clinical followup is recommended.

## 2007-07-01 IMAGING — PT NM PET TUM IMG SKULL BASE T - THIGH
6 series · 25 of 25 positions shown · IV contrast (omnipaque)
Comparison: 09/12/2006
COMPARISON: 11/09/2005
COMPARISON: 11/09/2005 and 01/08/2007
COMPARISON: 01/08/2007

CLINICAL DATA: Breast cancer

HEAD CT WITHOUT AND WITH CONTRAST
TECHNIQUE: 5mm collimated images were obtained from the base of the skull
through the vertex according to standard protocol before and after
administration of intravenous contrast.
Contrast:  125 cc Omnipaque 300
TECHNIQUE: Multidetector CT imaging of the neck was performed following the
standard protocol during administration of intravenous contrast.
TECHNIQUE: Multidetector CT imaging of the chest was performed following the
standard protocol during bolus administration of intravenous contrast.
TECHNIQUE: 17.4 mCi F-18 FDG was injected intravenously via the right foot . 
Full-ring PET imaging was performed from the skull base through the mid-thighs
55 minutes after injection.  CT data was obtained and used for attenuation
correction and anatomic localization only.  (This was not acquired as a
diagnostic CT examination.)

[Series 1: pet ac · axial · 3.3mm · 4.69mm/px · z∈[-735,-9]mm · 5 of 223 slices shown]
[im 1/223]
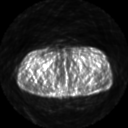
[im 56/223]
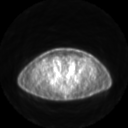
[im 112/223]
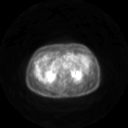
[im 167/223]
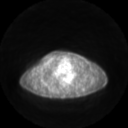
[im 223/223]
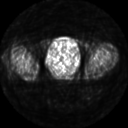

[Series 2: pet nac · axial · 3.3mm · 4.69mm/px · z∈[-735,-9]mm · 6 of 223 slices shown]
[im 1/223]
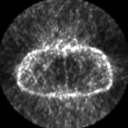
[im 45/223]
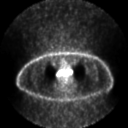
[im 89/223]
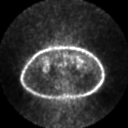
[im 134/223]
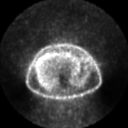
[im 178/223]
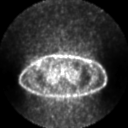
[im 223/223]
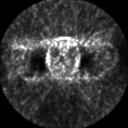

[Series 2: ct images · axial · 3.8mm · 0.98mm/px · z∈[-735,-10]mm · 5 of 223 slices shown]
[im 1/223]
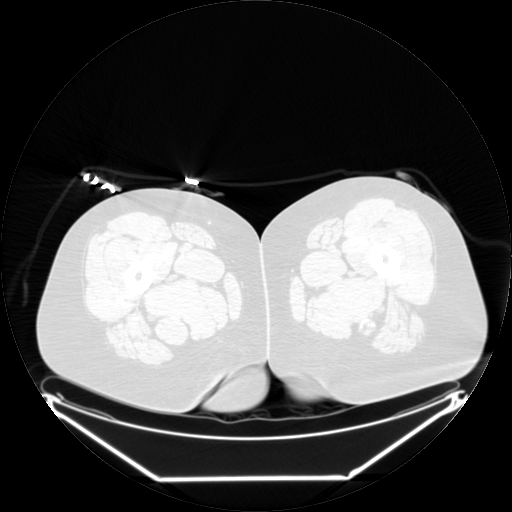
[im 56/223]
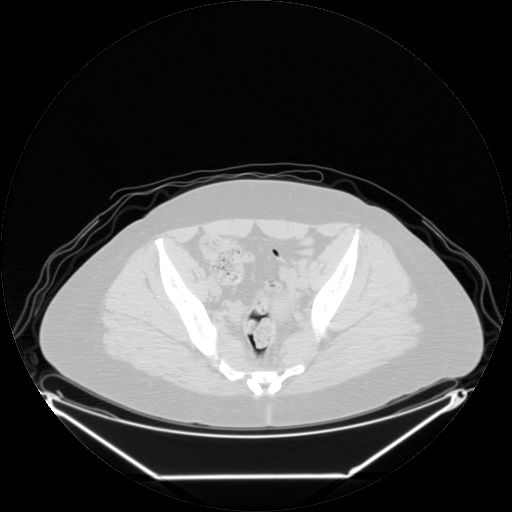
[im 112/223]
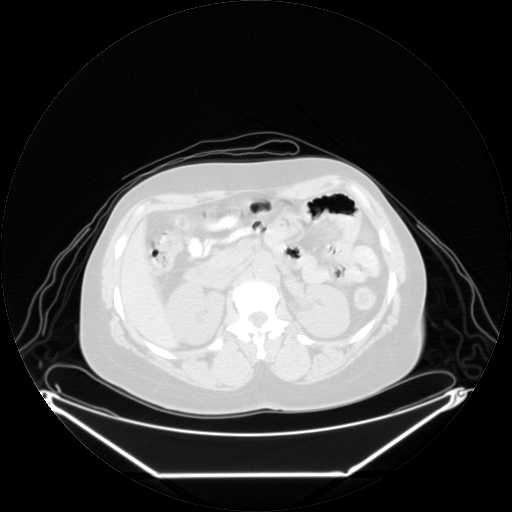
[im 167/223]
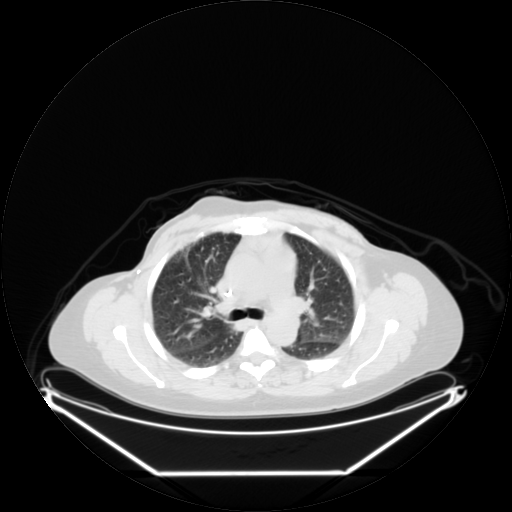
[im 223/223]
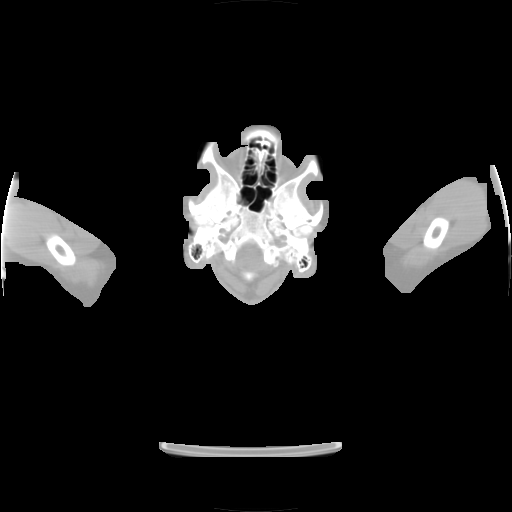

[Series 123: mip · coronal · 3.3mm · 4.69mm/px · 1 of 30 slices shown]
[im 1/30]
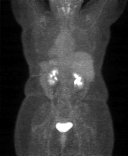

[Series 151: reformatted · axial · 3.3mm · 3.91mm/px · z∈[-735,-9]mm · 6 of 221 slices shown (1 of 2)]
[im 1/221]
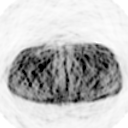
[im 45/221]
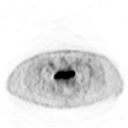
[im 89/221]
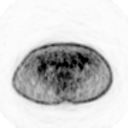
[im 133/221]
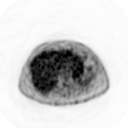
[im 177/221]
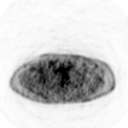
[im 221/221]
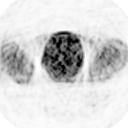

[Series 153: reformatted · coronal · 4.7mm · 5.83mm/px · 2 of 66 slices shown (2 of 2)]
[im 1/66]
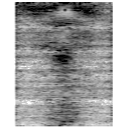
[im 66/66]
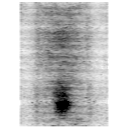

[25 of 25 positions shown; findings below may reference images not displayed]

FINDINGS: The cerebral and cerebellar hemispheres are normal in attenuation and
morphology.

The midline is maintained there is no edema or mass-effect.

No abnormal extra-axial fluid collection, intracranial hemorrhage or masses
noted.

The midline is maintained.

On the postcontrast images no abnormal areas of enhancement are noted to suggest
brain metastasis.

The mastoid air cells and paranasal sinuses are normally aerated.

No focal lytic or sclerotic lesions are noted.

IMPRESSION

1. No evidence for brain metastasis 

NECK CT WITH CONTRAST
FINDINGS: Left jugular digastric lymph node measures 15.2 mm x 10.8 mm, image
25. This is unchanged compared to prior exam.

Right jugulodigastric lymph node measures 13.6 x 12.6 mm, image 25. This is
unchanged compared to prior exam. 

No enhancing masses are identified. 

The vascular structures of the soft tissues of the neck are patent.

IMPRESSION

1.  Stable, nonspecific enlarged bilateral jugulodigastric lymph nodes.
2.  No new findings.

CHEST CT WITH CONTRAST
FINDINGS: No enlarged axillary lymph nodes.

The patient is status post bilateral mastectomy and right axillary lymph node
dissection.

Stable non pathologically enlarged left axillary lymph nodes.

No mediastinal or hilar lymphadenopathy.

No pericardial or pleural effusions.

4 mm nodule within the superior segment of the right lower lobe is again noted,
image 29. This is stable compared to prior exam.

No new pulmonary nodules or masses noted.

Review of the bone windows shows mild thoracic spondylosis.

No lytic or sclerotic lesions are identified.

Within the inferior aspect of the right lobe of liver there is a focal area of
low density which measures 8.6 mm, image 54. On the previous CT scan this
measured 7.0 mm and on the CT from 11/13/2005 this measured 6.3 mm.

IMPRESSION

1. Stable CT of the chest.

2. There is a focal area of low density within the right lobe of liver. This
minimally increased in size when compared to the exam from a 01/08/07 and is
increased by 2mm when compared with the exam from 11/09/2005. Although liver
metastasis is considered less likely, careful clinical followup of this lesion
is recommended.

FDG PET-CT TUMOR IMAGING (SKULL BASE TO THIGHS):

Fasting Blood Glucose:  107
FINDINGS: Again seen is mild nonspecific increased radiotracer uptake within
bilateral enlarged jugulodigastric lymph nodes. The SUV mass within the lymph
node on the right side of the neck measures 4.4. This is compared with
previously

No hypermetabolic axillary lymph nodes.

Cardiac enlargement.

No hypermetabolic mediastinal or hilar lymph nodes.

No abnormal increased FDG uptake is seen within the liver parenchyma.
Specifically, there is no abnormal uptake in the region of the small 8 mm low
density focus in the right lobe of liver.

Spleen is negative.

Pancreas is negative.

Adrenal glands negative.

No hypermetabolic pelvic or renal lymph nodes.

Urinary bladder is negative.

The uterus and adnexal structures are unremarkable.

No abnormal foci of increased radiotracer uptake is seen within the visualized
axial or appendicular skeleton.
IMPRESSION: 1. No evidence for hypermetabolic metastasis within the chest, abdomen, or
pelvis.
2. No significant change in nonspecific increased uptake within the bilateral
cervical lymph nodes. These remain indeterminant. Given their stability however
I favor a benign etiology such as inflammatory or reactive change. Continued
clinical followup is recommended.

## 2007-07-24 ENCOUNTER — Emergency Department (HOSPITAL_COMMUNITY): Admission: EM | Admit: 2007-07-24 | Discharge: 2007-07-24 | Payer: Self-pay | Admitting: Emergency Medicine

## 2007-07-25 ENCOUNTER — Ambulatory Visit: Payer: Self-pay | Admitting: Oncology

## 2007-07-27 LAB — CBC WITH DIFFERENTIAL/PLATELET
BASO%: 2.4 % — ABNORMAL HIGH (ref 0.0–2.0)
Basophils Absolute: 0.1 10*3/uL (ref 0.0–0.1)
EOS%: 2.7 % (ref 0.0–7.0)
Eosinophils Absolute: 0.2 10*3/uL (ref 0.0–0.5)
HCT: 38.1 % (ref 34.8–46.6)
HGB: 13 g/dL (ref 11.6–15.9)
LYMPH%: 36.9 % (ref 14.0–48.0)
MCH: 28.5 pg (ref 26.0–34.0)
MCHC: 34.1 g/dL (ref 32.0–36.0)
MCV: 83.5 fL (ref 81.0–101.0)
MONO#: 0.5 10*3/uL (ref 0.1–0.9)
MONO%: 9 % (ref 0.0–13.0)
NEUT#: 3 10*3/uL (ref 1.5–6.5)
NEUT%: 49.1 % (ref 39.6–76.8)
Platelets: 247 10*3/uL (ref 145–400)
RBC: 4.56 10*6/uL (ref 3.70–5.32)
RDW: 11.1 % — ABNORMAL LOW (ref 11.3–14.5)
WBC: 6.1 10*3/uL (ref 3.9–10.0)
lymph#: 2.2 10*3/uL (ref 0.9–3.3)

## 2007-07-27 LAB — CANCER ANTIGEN 27.29: CA 27.29: 27 U/mL (ref 0–39)

## 2007-07-27 LAB — COMPREHENSIVE METABOLIC PANEL
ALT: 11 U/L (ref 0–35)
AST: 18 U/L (ref 0–37)
Albumin: 4.1 g/dL (ref 3.5–5.2)
Alkaline Phosphatase: 99 U/L (ref 39–117)
BUN: 13 mg/dL (ref 6–23)
CO2: 24 mEq/L (ref 19–32)
Calcium: 9.1 mg/dL (ref 8.4–10.5)
Chloride: 106 mEq/L (ref 96–112)
Creatinine, Ser: 0.74 mg/dL (ref 0.40–1.20)
Glucose, Bld: 106 mg/dL — ABNORMAL HIGH (ref 70–99)
Potassium: 4 mEq/L (ref 3.5–5.3)
Sodium: 139 mEq/L (ref 135–145)
Total Bilirubin: 0.3 mg/dL (ref 0.3–1.2)
Total Protein: 7.2 g/dL (ref 6.0–8.3)

## 2007-07-27 LAB — TECHNOLOGIST REVIEW

## 2007-07-27 LAB — LACTATE DEHYDROGENASE: LDH: 170 U/L (ref 94–250)

## 2007-07-30 ENCOUNTER — Ambulatory Visit (HOSPITAL_COMMUNITY): Admission: RE | Admit: 2007-07-30 | Discharge: 2007-07-30 | Payer: Self-pay | Admitting: Oncology

## 2007-08-28 ENCOUNTER — Emergency Department (HOSPITAL_COMMUNITY): Admission: EM | Admit: 2007-08-28 | Discharge: 2007-08-28 | Payer: Self-pay | Admitting: Emergency Medicine

## 2007-09-01 IMAGING — CT CT CHEST W/ CM
1 of 4 series · 14 of 31 positions shown, 18 images · IV contrast (omnipaque)
Comparison: 02/15/2007; 01/08/2007

CHEST CT WITH CONTRAST

CLINICAL DATA: Breast cancer.
TECHNIQUE: Multidetector CT imaging of the chest, abdomen, and pelvis was
performed following the standard protocol during bolus administration of
intravenous contrast. Patient has an IV contrast allergy, and was premedicated
with the standard protocol.

Contrast:  125 cc Omnipaque 300

[Series 2: cap 5.0 b40f st · axial · 0.68mm/px · z∈[+298,+772]mm · 14 of 113 slices shown, 18 images]
[im 9/113  mediastinal]
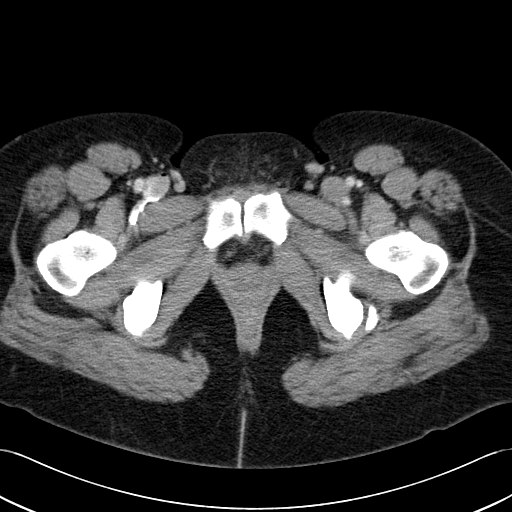
[im 9/113  lung]
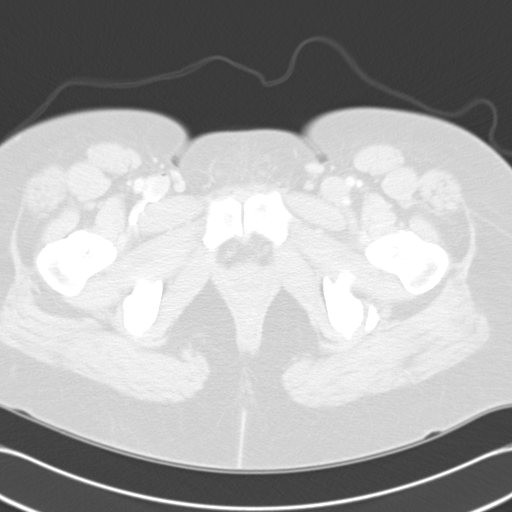
[im 18/113  lung]
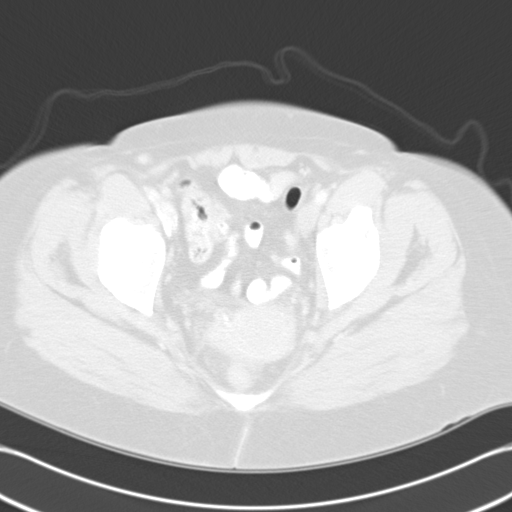
[im 26/113  lung]
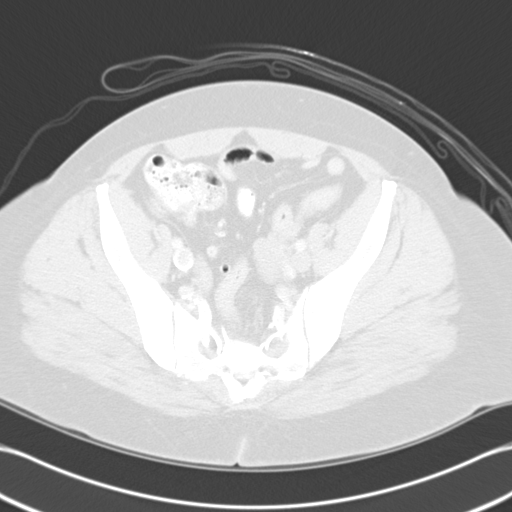
[im 35/113  lung]
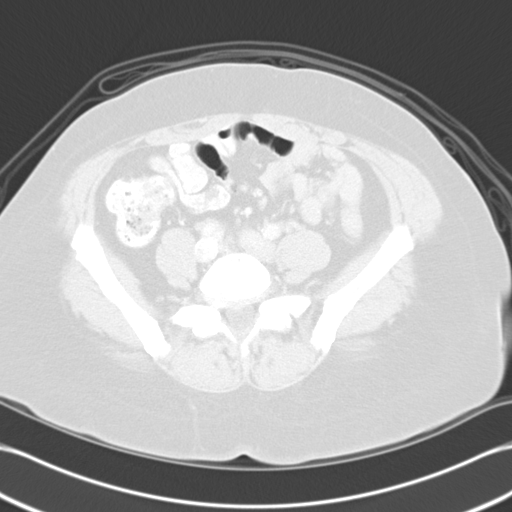
[im 44/113  mediastinal]
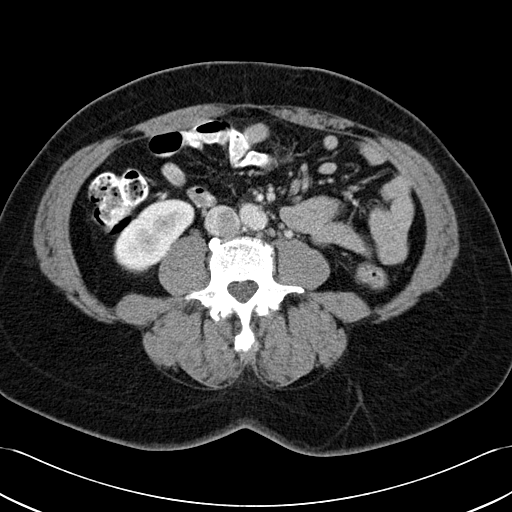
[im 44/113  lung]
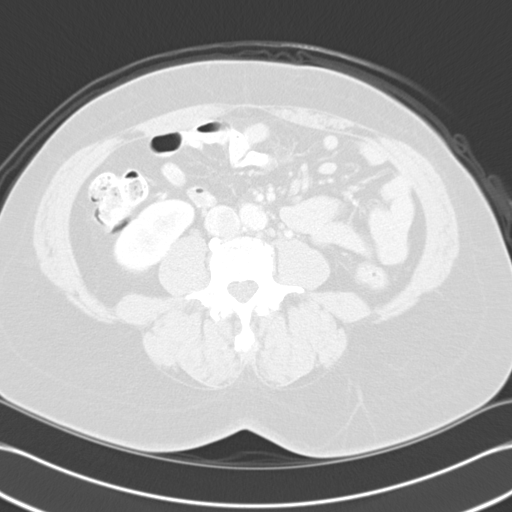
[im 52/113  lung]
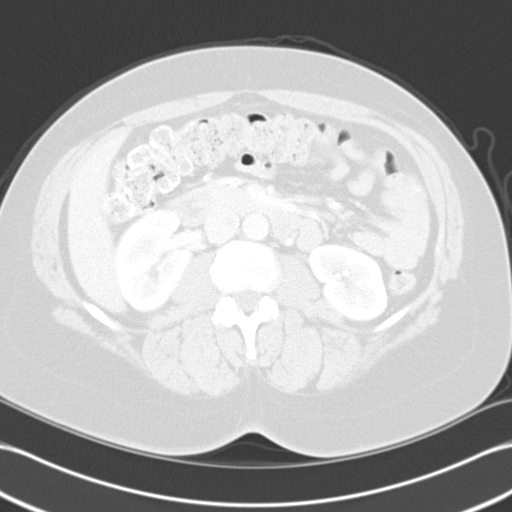
[im 55/113  lung]
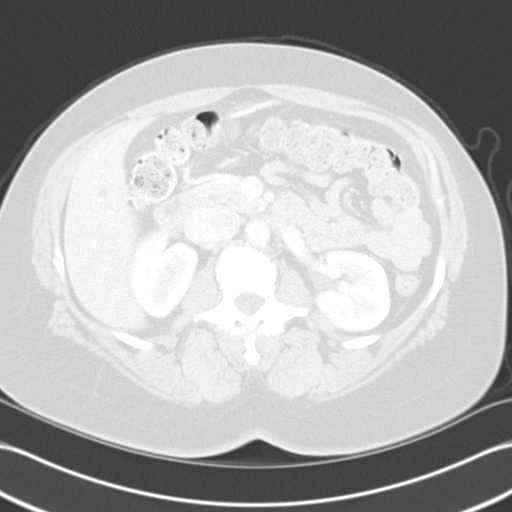
[im 57/113  lung]
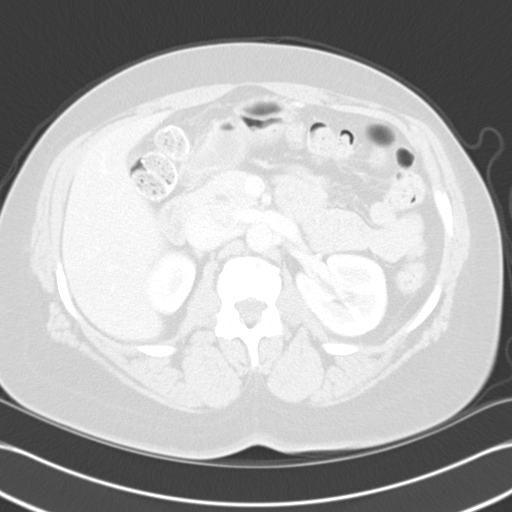
[im 61/113  mediastinal]
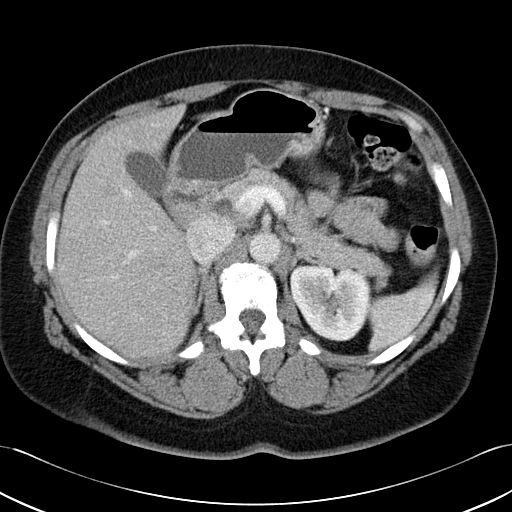
[im 61/113  lung]
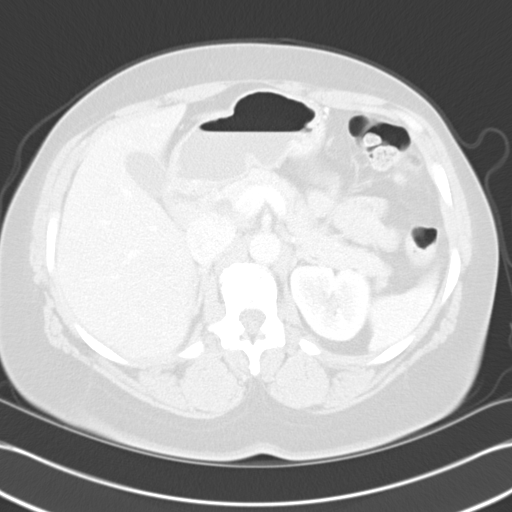
[im 69/113  lung]
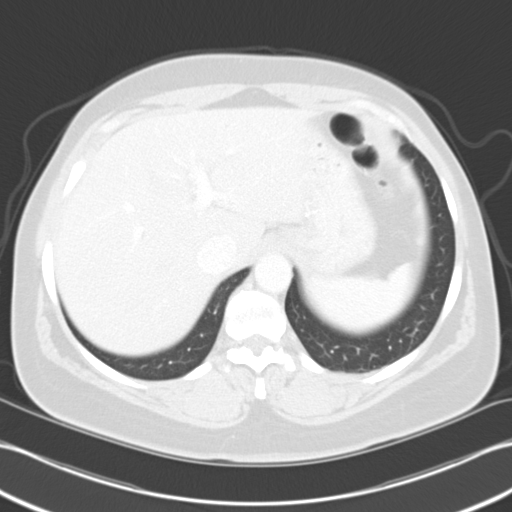
[im 78/113  lung]
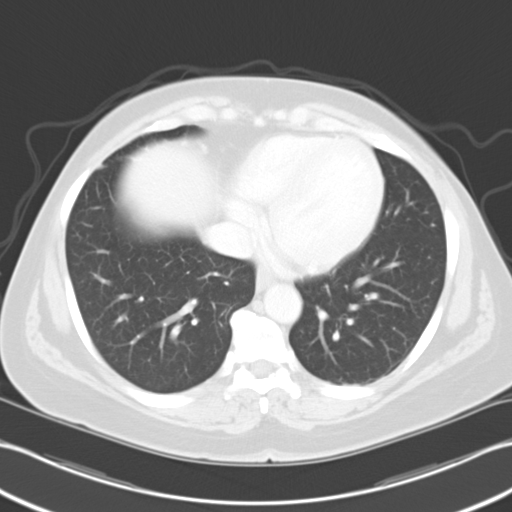
[im 87/113  lung]
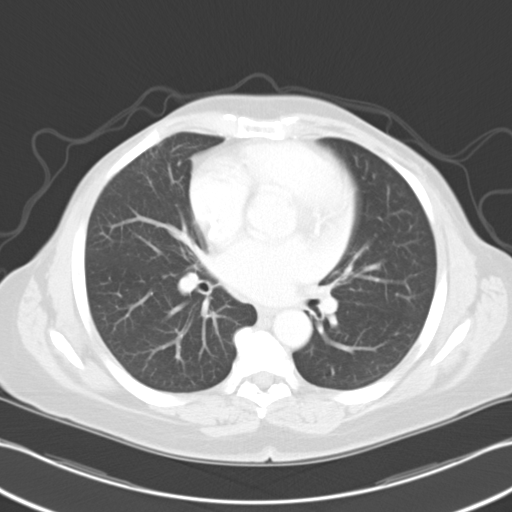
[im 95/113  mediastinal]
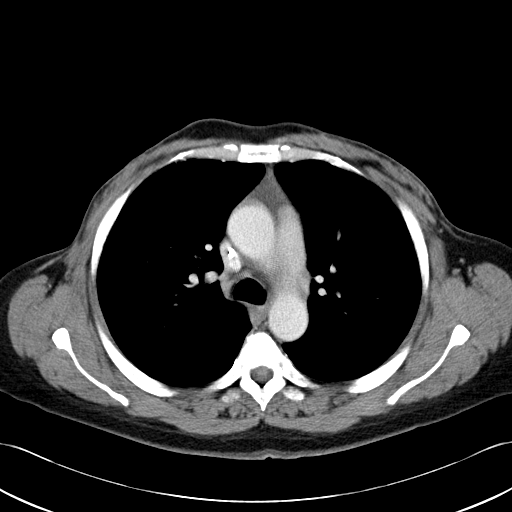
[im 95/113  lung]
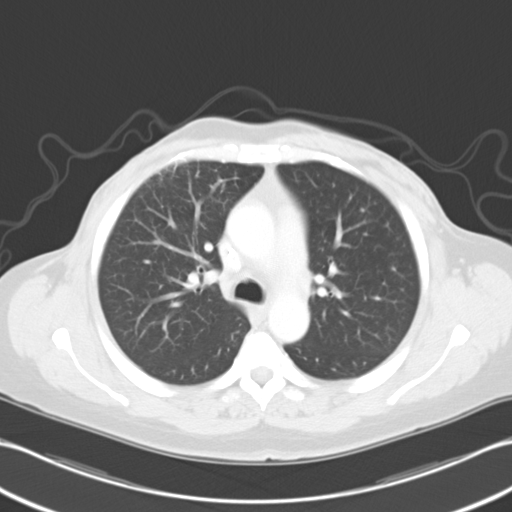
[im 104/113  lung]
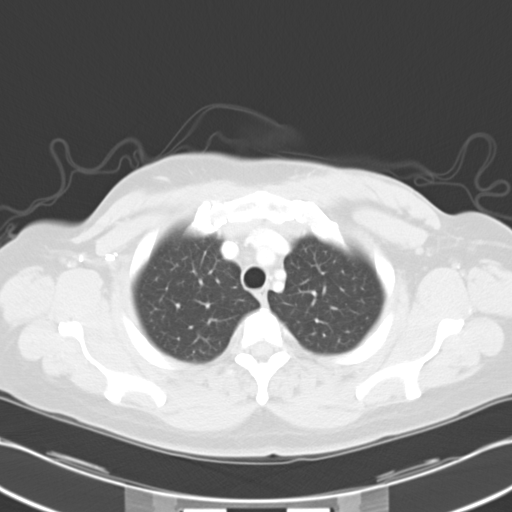

[14 of 31 positions shown; findings below may reference images not displayed]

FINDINGS: The thoracic inlet appears unremarkable.

A left subpectoral lymph node measures 2.0 x 0.5 cm previously, and previously
measured 1.8 x 0.6 cm. Right-sided Port-A-Cath noted. No pathologic mediastinal
or hilar adenopathy is identified. Mild peripheral fibrosis is present
anteriorly in the right upper lobe and appear stable. A 4 mm right lower lobe
pulmonary nodule on image 32 series 3 is stable. No discrete bony metastatic
disease is identified.

IMPRESSION

Stable CT appearance of the chest. A left subpectoral lymph node measures 2.0 x
0.5 cm but appear stable. No compelling increased FDG activity was identified in
this region on recent PET-CT, but attention to this node on followup imaging is
recommended.

ABDOMEN CT WITH CONTRAST
FINDINGS: A 6 mm hypodense lesion inferiorly in the right hepatic lobe on image
59 series 2 has been present since 03/25/2002, and likely represents a small
benign cyst. The liver, spleen, adrenal glands, pancreas, and gallbladder appear
otherwise unremarkable. The common bile duct remains at the upper limits of
normal in size. The kidneys appear unremarkable.

IMPRESSION

1. Stable CT appearance of the upper abdomen, without findings of upper
abdominal metastatic disease.

PELVIS CT WITH CONTRAST
FINDINGS: The uterus and adnexa appear stable. The urinary bladder appears
unremarkable. Erosions or subcortical cysts are present along the acetabula,
left greater than right. An ossific density along the left hamstring origination
likely represents remote injury or fragmented spurring. Small bilateral mobile
lymph nodes appear stable.

IMPRESSION

1. Stable CT appearance the pelvis, without specific findings of metastatic
disease.

## 2007-09-01 IMAGING — PT NM PET TUM IMG SKULL BASE T - THIGH
6 series · 25 of 25 positions shown · non-contrast
Comparison: 02/15/2007

CLINICAL DATA: Breast cancer

FDG PET-CT TUMOR IMAGING (SKULL BASE TO THIGHS):
TECHNIQUE: 17.2 mCi F-18 FDG was injected intravenously.  Full-ring PET
imaging was performed from the skull base through the mid-thighs.  CT data was
obtained and used for attenuation correction and anatomic localization only. 
(This was not acquired as a diagnostic CT examination.)  Data regarding location
of IV injection, patient weight, height, fasting blood sugar level, and timing
of scan is available in PACS in the scanned documentation.

[Series 1: pet ac · axial · 3.3mm · 4.69mm/px · z∈[-738,-12]mm · 6 of 223 slices shown]
[im 1/223]
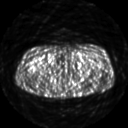
[im 45/223]
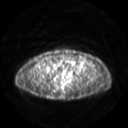
[im 89/223]
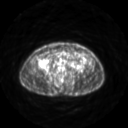
[im 134/223]
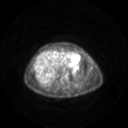
[im 178/223]
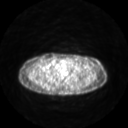
[im 223/223]
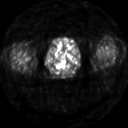

[Series 2: ct images · axial · 3.8mm · 0.98mm/px · z∈[-738,-12]mm · 6 of 222 slices shown]
[im 1/222]
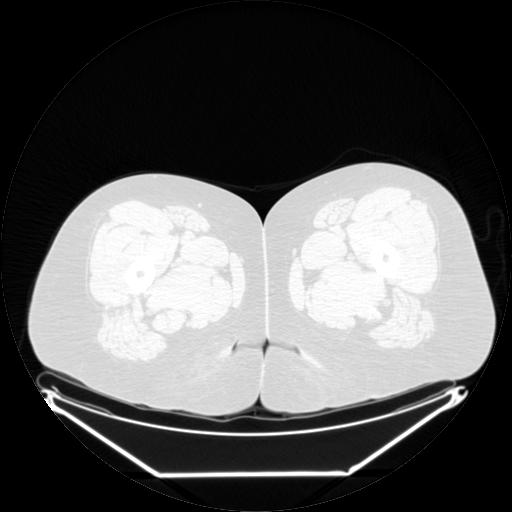
[im 45/222]
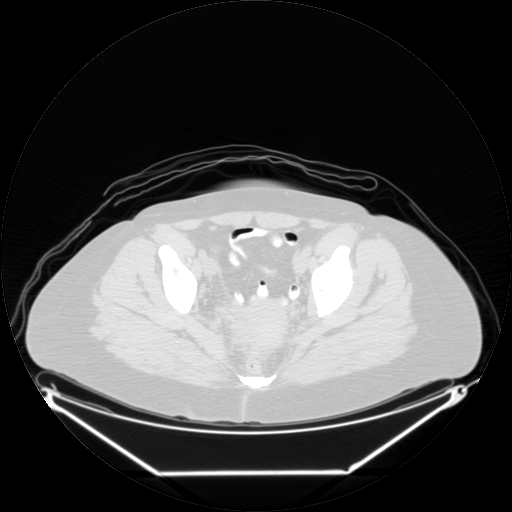
[im 89/222]
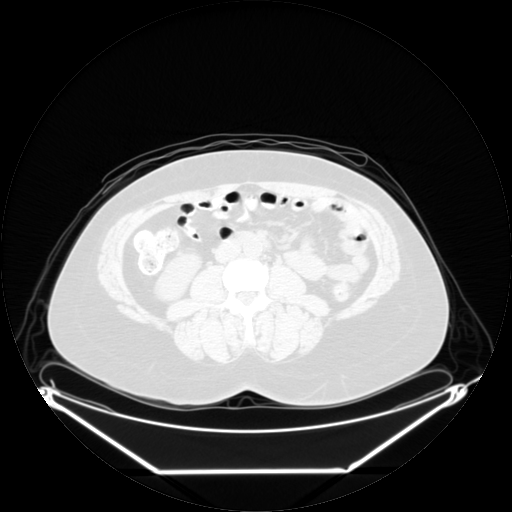
[im 133/222]
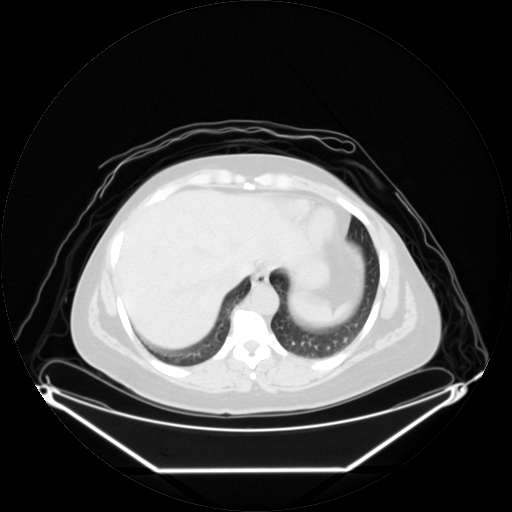
[im 177/222]
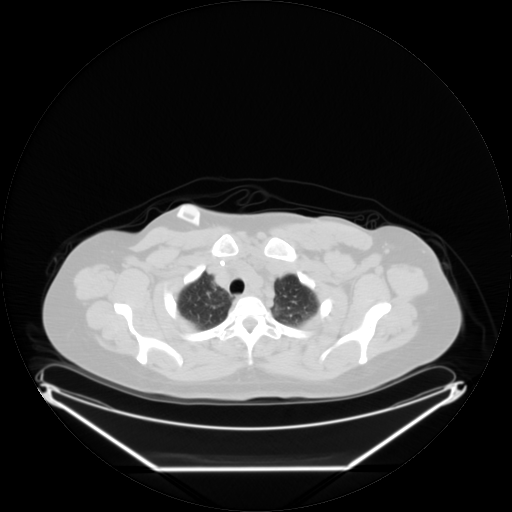
[im 222/222]
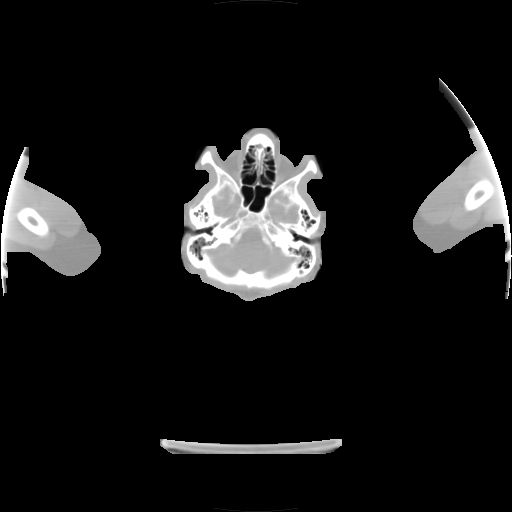

[Series 2: pet nac · axial · 3.3mm · 4.69mm/px · z∈[-738,-12]mm · 6 of 223 slices shown]
[im 1/223]
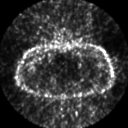
[im 45/223]
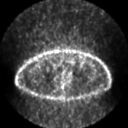
[im 89/223]
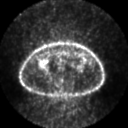
[im 134/223]
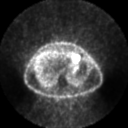
[im 178/223]
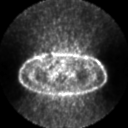
[im 223/223]
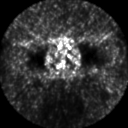

[Series 123: mip · coronal · 3.3mm · 4.69mm/px · 1 of 30 slices shown]
[im 1/30]
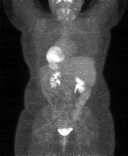

[Series 151: reformatted · axial · 3.3mm · 3.91mm/px · z∈[-683,-45]mm · 5 of 194 slices shown (1 of 2)]
[im 1/194]
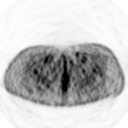
[im 49/194]
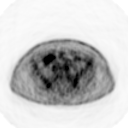
[im 97/194]
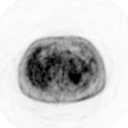
[im 145/194]
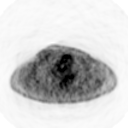
[im 194/194]
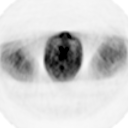

[Series 153: reformatted · coronal · 4.7mm · 5.83mm/px · 1 of 54 slices shown (2 of 2)]
[im 1/54]
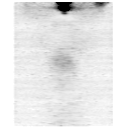

[25 of 25 positions shown; findings below may reference images not displayed]

FINDINGS: Bilateral mild posterior cervical triangle no activity is
identified, with maximum standard uptake value in the right of 3.4 g/mL compares
to prior measurement of 4.4 g/mL. Activity in the tonsillar pillars, pterygoid
muscles, and proximal esophagus near the free or esophageal junction is likely
due to swallowing during imaging.

No significant abnormal activity is identified in the chest, abdomen, or pelvis
to suggest metastatic malignancy.
IMPRESSION: 1. No findings characteristic for metastatic breast cancer. Faintly active lymph
nodes in the posterior cervical frontal appear still minimally prominent in
activity, although reduced from the prior exam.

## 2007-09-03 ENCOUNTER — Ambulatory Visit (HOSPITAL_COMMUNITY): Admission: RE | Admit: 2007-09-03 | Discharge: 2007-09-03 | Payer: Self-pay | Admitting: Cardiology

## 2007-09-18 ENCOUNTER — Ambulatory Visit: Payer: Self-pay | Admitting: Oncology

## 2007-09-20 LAB — CBC WITH DIFFERENTIAL/PLATELET
BASO%: 1.1 % (ref 0.0–2.0)
Basophils Absolute: 0.1 10*3/uL (ref 0.0–0.1)
EOS%: 7 % (ref 0.0–7.0)
Eosinophils Absolute: 0.5 10*3/uL (ref 0.0–0.5)
HCT: 32.5 % — ABNORMAL LOW (ref 34.8–46.6)
HGB: 10.6 g/dL — ABNORMAL LOW (ref 11.6–15.9)
LYMPH%: 32.7 % (ref 14.0–48.0)
MCH: 28.5 pg (ref 26.0–34.0)
MCHC: 32.7 g/dL (ref 32.0–36.0)
MCV: 87.3 fL (ref 81.0–101.0)
MONO#: 0.8 10*3/uL (ref 0.1–0.9)
MONO%: 11.8 % (ref 0.0–13.0)
NEUT#: 3.2 10*3/uL (ref 1.5–6.5)
NEUT%: 47.3 % (ref 39.6–76.8)
Platelets: 247 10*3/uL (ref 145–400)
RBC: 3.72 10*6/uL (ref 3.70–5.32)
RDW: 14 % (ref 11.3–14.5)
WBC: 6.8 10*3/uL (ref 3.9–10.0)
lymph#: 2.2 10*3/uL (ref 0.9–3.3)

## 2007-09-20 LAB — COMPREHENSIVE METABOLIC PANEL
ALT: 27 U/L (ref 0–35)
AST: 33 U/L (ref 0–37)
Albumin: 4 g/dL (ref 3.5–5.2)
Alkaline Phosphatase: 109 U/L (ref 39–117)
BUN: 13 mg/dL (ref 6–23)
CO2: 26 mEq/L (ref 19–32)
Calcium: 8.9 mg/dL (ref 8.4–10.5)
Chloride: 105 mEq/L (ref 96–112)
Creatinine, Ser: 1.09 mg/dL (ref 0.40–1.20)
Glucose, Bld: 95 mg/dL (ref 70–99)
Potassium: 4.1 mEq/L (ref 3.5–5.3)
Sodium: 139 mEq/L (ref 135–145)
Total Bilirubin: 0.3 mg/dL (ref 0.3–1.2)
Total Protein: 6.7 g/dL (ref 6.0–8.3)

## 2007-09-20 LAB — CANCER ANTIGEN 27.29: CA 27.29: 22 U/mL (ref 0–39)

## 2007-09-20 LAB — LACTATE DEHYDROGENASE: LDH: 207 U/L (ref 94–250)

## 2007-10-26 LAB — COMPREHENSIVE METABOLIC PANEL
ALT: 11 U/L (ref 0–35)
AST: 21 U/L (ref 0–37)
Albumin: 4.1 g/dL (ref 3.5–5.2)
Alkaline Phosphatase: 110 U/L (ref 39–117)
BUN: 8 mg/dL (ref 6–23)
CO2: 24 mEq/L (ref 19–32)
Calcium: 9.2 mg/dL (ref 8.4–10.5)
Chloride: 106 mEq/L (ref 96–112)
Creatinine, Ser: 0.74 mg/dL (ref 0.40–1.20)
Glucose, Bld: 104 mg/dL — ABNORMAL HIGH (ref 70–99)
Potassium: 4 mEq/L (ref 3.5–5.3)
Sodium: 141 mEq/L (ref 135–145)
Total Bilirubin: 0.3 mg/dL (ref 0.3–1.2)
Total Protein: 7.2 g/dL (ref 6.0–8.3)

## 2007-10-26 LAB — CBC WITH DIFFERENTIAL/PLATELET
BASO%: 1.7 % (ref 0.0–2.0)
Basophils Absolute: 0.1 10*3/uL (ref 0.0–0.1)
EOS%: 4.5 % (ref 0.0–7.0)
Eosinophils Absolute: 0.3 10*3/uL (ref 0.0–0.5)
HCT: 38.9 % (ref 34.8–46.6)
HGB: 13 g/dL (ref 11.6–15.9)
LYMPH%: 25.3 % (ref 14.0–48.0)
MCH: 30 pg (ref 26.0–34.0)
MCHC: 33.5 g/dL (ref 32.0–36.0)
MCV: 89.6 fL (ref 81.0–101.0)
MONO#: 0.5 10*3/uL (ref 0.1–0.9)
MONO%: 7.1 % (ref 0.0–13.0)
NEUT#: 4.2 10*3/uL (ref 1.5–6.5)
NEUT%: 61.5 % (ref 39.6–76.8)
Platelets: 297 10*3/uL (ref 145–400)
RBC: 4.34 10*6/uL (ref 3.70–5.32)
RDW: 13.4 % (ref 11.3–14.5)
WBC: 6.8 10*3/uL (ref 3.9–10.0)
lymph#: 1.7 10*3/uL (ref 0.9–3.3)

## 2007-10-26 LAB — LACTATE DEHYDROGENASE: LDH: 168 U/L (ref 94–250)

## 2007-10-26 LAB — CANCER ANTIGEN 27.29: CA 27.29: 24 U/mL (ref 0–39)

## 2007-11-21 ENCOUNTER — Ambulatory Visit (HOSPITAL_COMMUNITY): Admission: RE | Admit: 2007-11-21 | Discharge: 2007-11-21 | Payer: Self-pay | Admitting: Oncology

## 2007-11-21 ENCOUNTER — Ambulatory Visit: Payer: Self-pay | Admitting: Oncology

## 2007-11-26 LAB — COMPREHENSIVE METABOLIC PANEL
ALT: 18 U/L (ref 0–35)
AST: 38 U/L — ABNORMAL HIGH (ref 0–37)
Albumin: 4.7 g/dL (ref 3.5–5.2)
Alkaline Phosphatase: 102 U/L (ref 39–117)
BUN: 11 mg/dL (ref 6–23)
CO2: 21 mEq/L (ref 19–32)
Calcium: 9.6 mg/dL (ref 8.4–10.5)
Chloride: 106 mEq/L (ref 96–112)
Creatinine, Ser: 0.79 mg/dL (ref 0.40–1.20)
Glucose, Bld: 111 mg/dL — ABNORMAL HIGH (ref 70–99)
Potassium: 3.7 mEq/L (ref 3.5–5.3)
Sodium: 140 mEq/L (ref 135–145)
Total Bilirubin: 0.3 mg/dL (ref 0.3–1.2)
Total Protein: 7.6 g/dL (ref 6.0–8.3)

## 2007-11-26 LAB — CBC WITH DIFFERENTIAL/PLATELET
BASO%: 0.9 % (ref 0.0–2.0)
Basophils Absolute: 0.1 10*3/uL (ref 0.0–0.1)
EOS%: 2.4 % (ref 0.0–7.0)
Eosinophils Absolute: 0.2 10*3/uL (ref 0.0–0.5)
HCT: 39.7 % (ref 34.8–46.6)
HGB: 13.3 g/dL (ref 11.6–15.9)
LYMPH%: 33.1 % (ref 14.0–48.0)
MCH: 29.3 pg (ref 26.0–34.0)
MCHC: 33.5 g/dL (ref 32.0–36.0)
MCV: 87.5 fL (ref 81.0–101.0)
MONO#: 0.3 10*3/uL (ref 0.1–0.9)
MONO%: 5.4 % (ref 0.0–13.0)
NEUT#: 3.6 10*3/uL (ref 1.5–6.5)
NEUT%: 58.2 % (ref 39.6–76.8)
Platelets: 318 10*3/uL (ref 145–400)
RBC: 4.54 10*6/uL (ref 3.70–5.32)
RDW: 13.9 % (ref 11.3–14.5)
WBC: 6.2 10*3/uL (ref 3.9–10.0)
lymph#: 2.1 10*3/uL (ref 0.9–3.3)

## 2007-11-26 LAB — CANCER ANTIGEN 27.29: CA 27.29: 32 U/mL (ref 0–39)

## 2007-11-26 LAB — LACTATE DEHYDROGENASE: LDH: 177 U/L (ref 94–250)

## 2007-11-29 ENCOUNTER — Emergency Department (HOSPITAL_COMMUNITY): Admission: EM | Admit: 2007-11-29 | Discharge: 2007-11-29 | Payer: Self-pay | Admitting: Family Medicine

## 2007-12-13 IMAGING — CT CT PELVIS W/ CM
3 of 8 series · 13 of 46 positions shown, 18 images · IV contrast (omnipaque)
Comparison: 02/15/2007
COMPARISON: 04/18/2007

CHEST CT WITH CONTRAST

CLINICAL DATA: Breast cancer

NECK CT WITH CONTRAST
TECHNIQUE: Multidetector CT imaging of the neck was performed following the
standard protocol during administration of intravenous contrast.
Contrast:  125 cc Omnipaque 300
TECHNIQUE: Multidetector CT imaging of the chest, abdomen, and pelvis was
performed following the standard protocol during bolus administration of
intravenous contrast.

[Series 2: cap 5.0 b40f · axial · 0.68mm/px · z∈[-702,-207]mm · 10 of 117 slices shown, 15 images]
[im 9/117  soft-tissue]
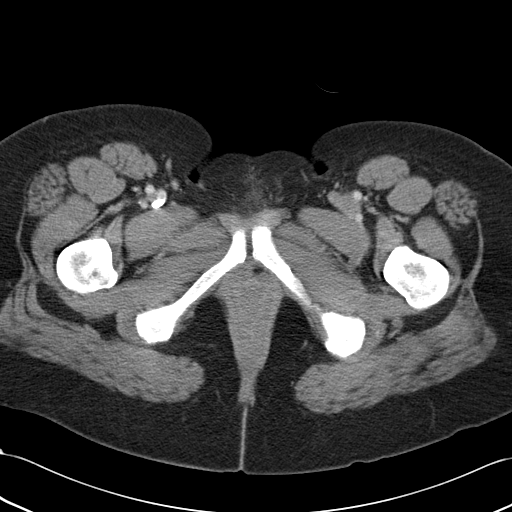
[im 9/117  bone]
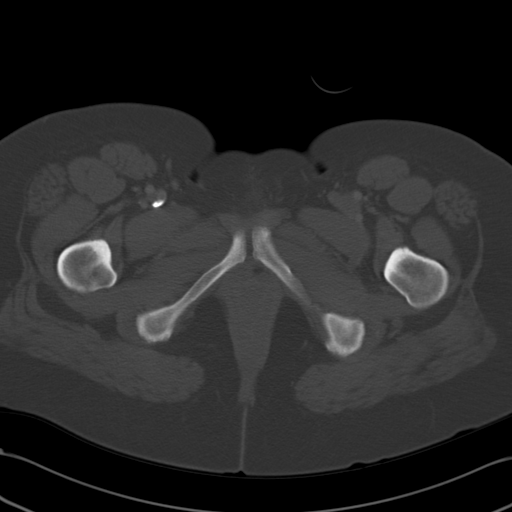
[im 27/117  soft-tissue]
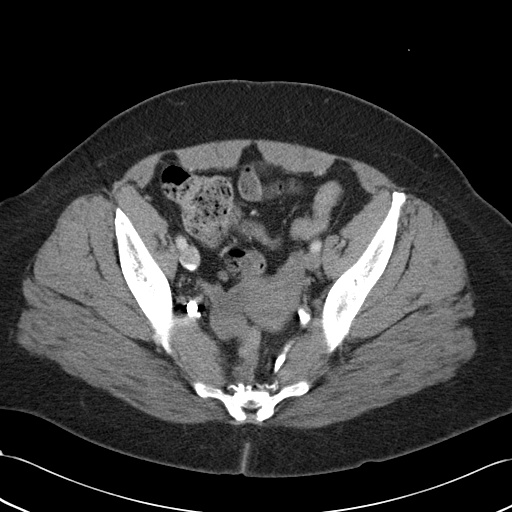
[im 36/117  soft-tissue]
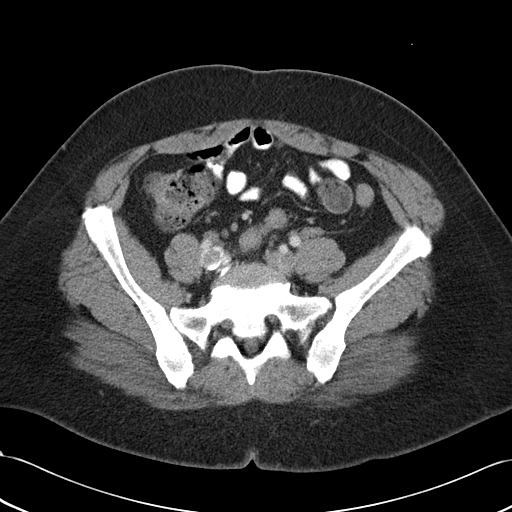
[im 45/117  soft-tissue]
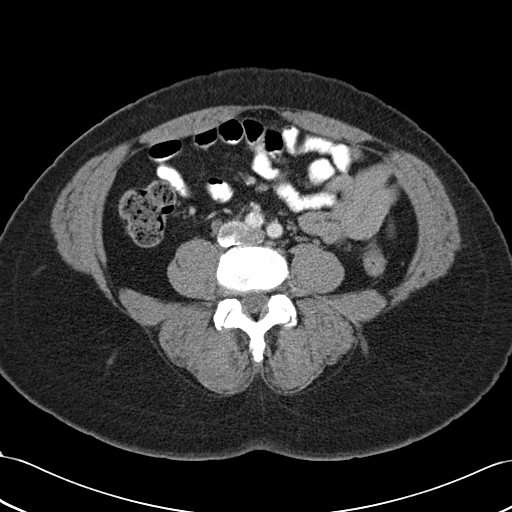
[im 63/117  soft-tissue]
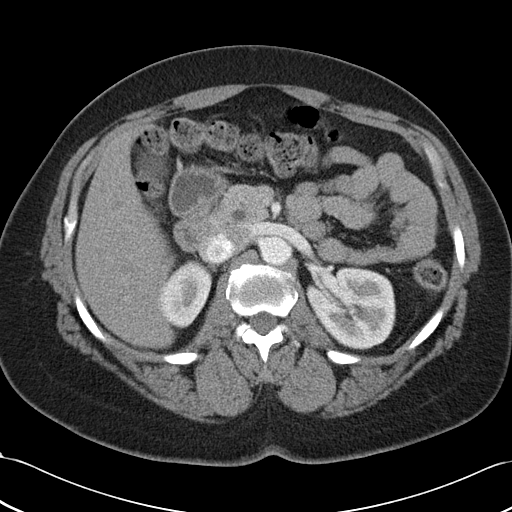
[im 72/117  soft-tissue]
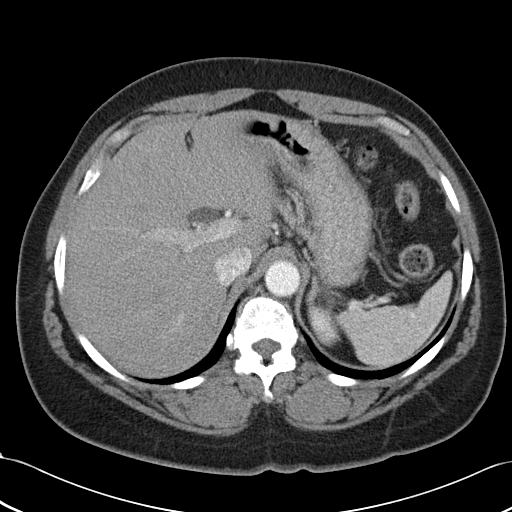
[im 81/117  soft-tissue]
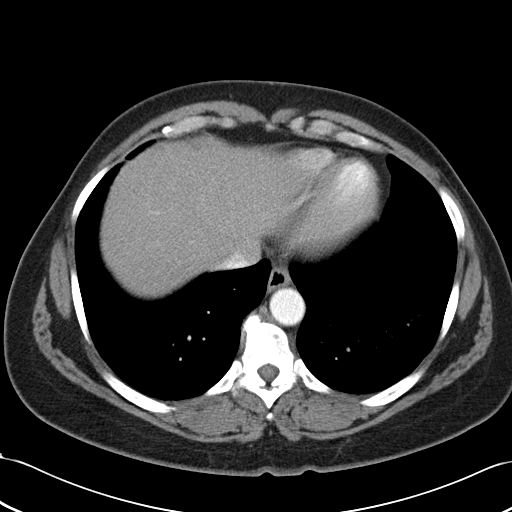
[im 81/117  lung]
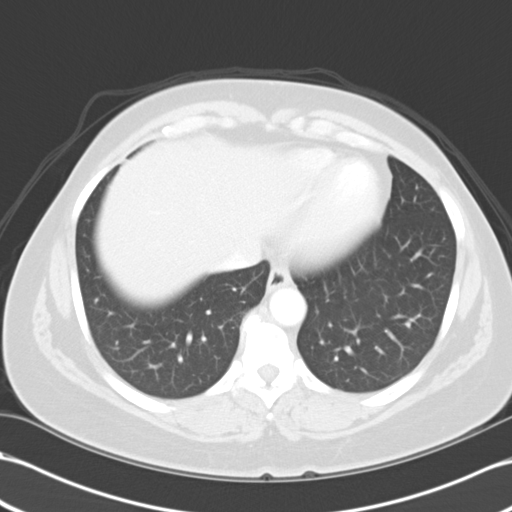
[im 90/117  lung]
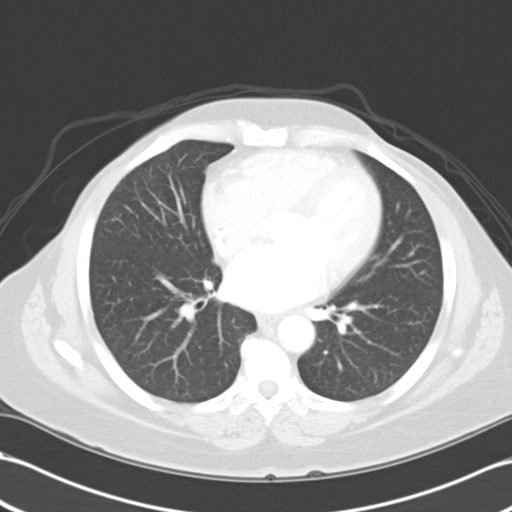
[im 99/117  soft-tissue]
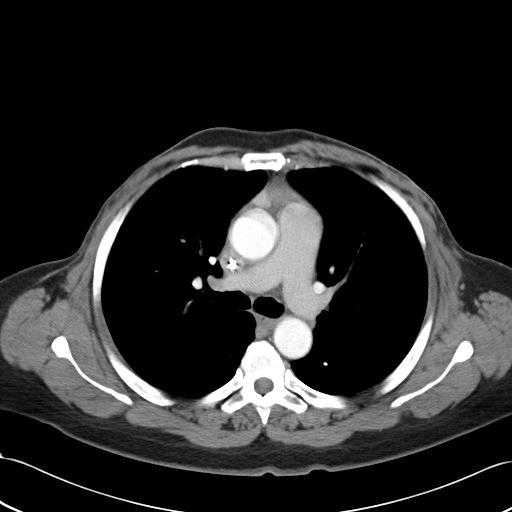
[im 99/117  lung]
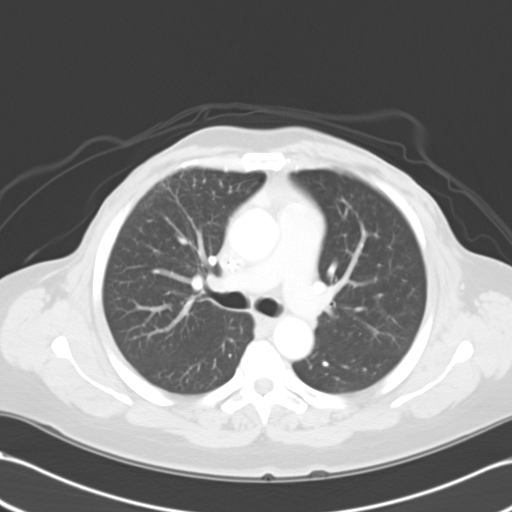
[im 108/117  soft-tissue]
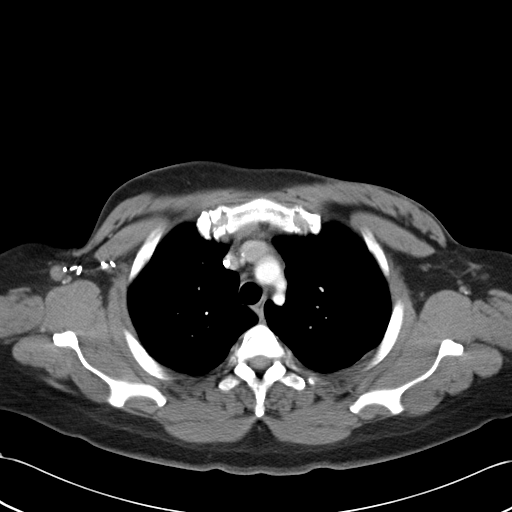
[im 108/117  lung]
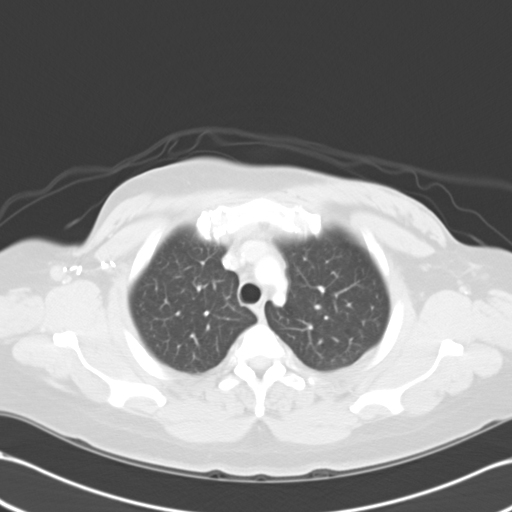
[im 108/117  bone]
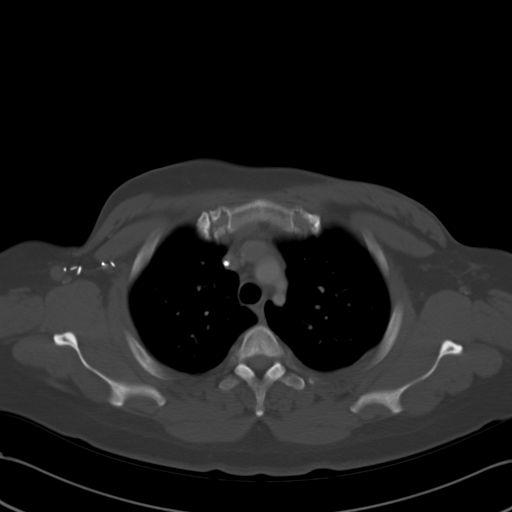

[Series 603: <mpr thick range(1)> · sagittal · 1.14mm/px · 1 of 108 slices shown]
[im 54/108  soft-tissue]
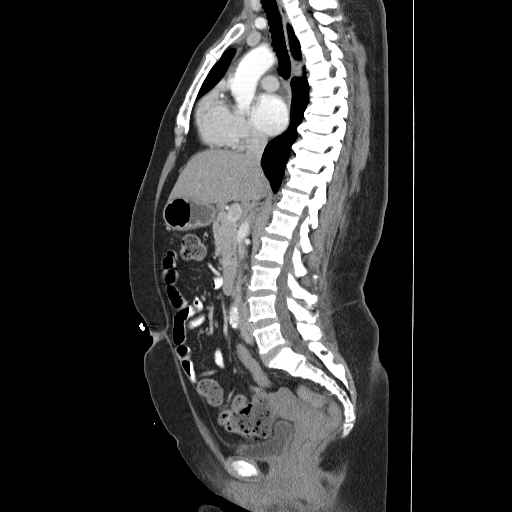

[Series 604: <mpr thick range(2)> · coronal · 0.44mm/px · 2 of 53 slices shown]
[im 18/53  soft-tissue]
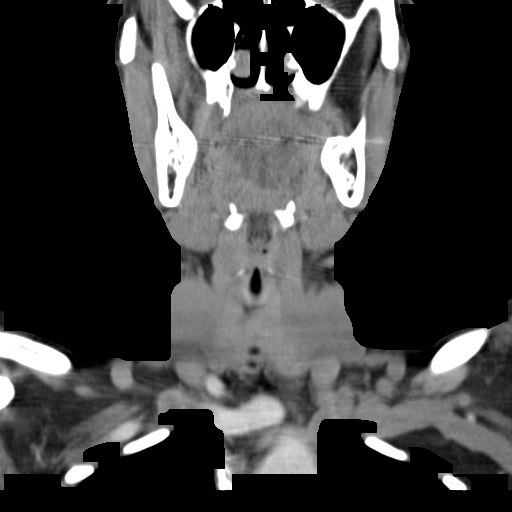
[im 35/53  soft-tissue]
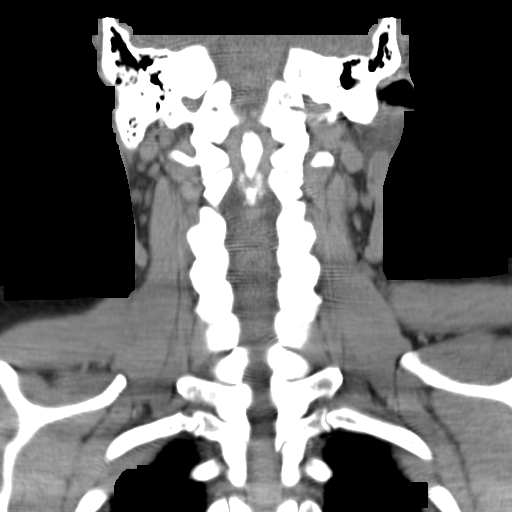

[13 of 46 positions shown; findings below may reference images not displayed]

FINDINGS: Prominent bilateral cervical lymph nodes are again noted. For example
left jugulodigastric lymph node measures 10.9 x 16.1 mm. This is unchanged from
prior exam.

Right post or cervical lymph node measures 21.9 x 6.5 mm, image 26. This is
compared with the same previously.

 A left submandibular lymph node measures 5.9 x 9.2 mm. This is stable from
prior study.

Limited images through the brain parenchyma unremarkable.

The paranasal sinuses and mastoid air cells are unremarkable.

Review of the bone windows  shows no focal lytic or focal sclerotic lesions. 

IMPRESSION

1. No significant change in prominent borderline enlarged bilateral cervical
lymph nodes.
FINDINGS: There is a right axillary lymph node which measures 11 mm, image 10. 
This is increased in size from 6.2 mm.

Retropectoral lymph node measures 10.5 x 16.5 mm, image 3.   This is new from
prior exam.

No enlarged mediastinal or hilar lymph nodes.

There is no pericardial or pleural fluid.

Parenchymal changes within the subpleural right upper lobe and right middle lobe
consistent with external beam radiation.

Pulmonary nodule in the superior segment of the left lower lobe measures 4 mm.
This is stable from prior study.

4 mm pulmonary nodule within the right lower lobe is unchanged from prior exam.

 The review of the bone windows shows multilevel thoracic spondylosis.

There is no lytic or sclerotic lesions identified.

IMPRESSION

1. Interval increase in size of right axillary and right retropectoral lymph
node.
2.  Stable subcentimeter pulmonary nodules.

ABDOMEN CT WITH CONTRAST
FINDINGS: The liver contains a tiny hypodensity measuring 6.1 mm, image 56. 
This is unchanged from prior exam and remains too small to reliably
characterize.

The spleen is negative.

Both adrenal glands are negative.

The pancreas is a negative.

Both kidneys are negative.

No enlarged retroperitoneal, or small bowel mesentery lymph nodes.

There is no free fluid or abnormal fluid collections.

Review of the bone windows is unremarkable for lytic or sclerotic lesions.

IMPRESSION

1. No mass or adenopathy.

PELVIS CT WITH CONTRAST
FINDINGS: There is no enlarged pelvic lymph nodes.

There is no free fluid.

The urinary bladder is negative. 

The uterus and the adnexal structures have a physiologic appearance

Prominent bilateral inguinal lymph nodes measure 18.2 x 10.4 mm, image 114. 
This is unchanged compared to prior exam.

Review of the bone windows shows no lytic or sclerotic lesions.

IMPRESSION

1. No mass or adenopathy.

## 2007-12-13 IMAGING — PT NM PET TUM IMG SKULL BASE T - THIGH
1 of 6 series · 1 of 25 positions shown · non-contrast
Comparison: 04/18/07.

CLINICAL DATA: FDG PET-CT TUMOR IMAGING (SKULL BASE TO THIGHS):
Fasting Blood Glucose:  103.
TECHNIQUE: 18 mCi F-18 FDG were administered via right foot.  Full ring PET imaging was performed from the skull base through the mid-thighs 67 minutes after injection.  CT data was obtained and used for attenuation correction and anatomic localization only.  (This was not acquired as a diagnostic CT examination.)

[Series 2: ct images · axial · 3.8mm · 0.98mm/px · 1 of 267 slices shown]
[im 267/267  brain]
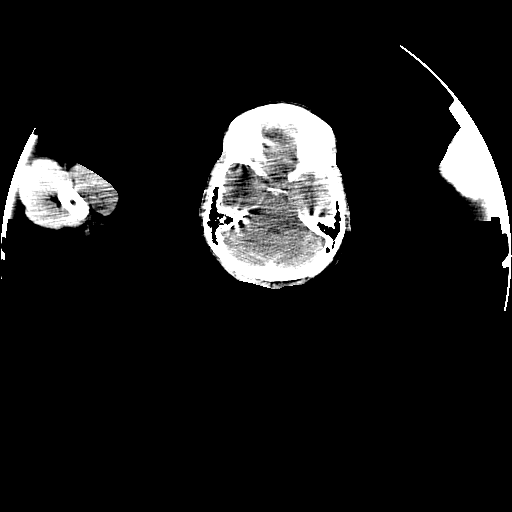

[1 of 25 positions shown; findings below may reference images not displayed]

FINDINGS: There is no hypermetabolic lymph nodes within the soft tissues of the neck.  
There is a hypermetabolic right retropectoral lymph node which has an SUV max equal to 5.7, image 43.  
Hypermetabolic right axillary lymph node has an SUV max equal to 8.9, image 52.  
No hypermetabolic mediastinal or hilar lymph nodes. 
There are no hyperbolic supraclavicular lymph nodes.  
No pericardial or pleural fluid.  
No hypermetabolic pulmonary nodules or masses.
There is no abnormal uptake within the liver parenchyma.  
The spleen is negative.  
Adrenal glands negative.
Pancreas negative. 
The kidneys are both negative.   
No hypermetabolic retroperitoneal, or small bowel mesenteric lymph nodes.   
There are no hypermetabolic pelvic or inguinal lymph nodes.   
Low-level FDG uptake is seen within borderline enlarged inguinal lymph nodes which are likely reactive in etiology.   
Review of the visualized axial and appendicular skeleton shows no evidence for hypermetabolic bone metastasis.
IMPRESSION: Hypermetabolic right axillary and right retropectoral lymph nodes.  This corresponds with enlarging lymph nodes on diagnostic CT obtained today.  Findings are concerning for recurrence of tumor.

## 2007-12-23 ENCOUNTER — Emergency Department (HOSPITAL_COMMUNITY): Admission: EM | Admit: 2007-12-23 | Discharge: 2007-12-23 | Payer: Self-pay | Admitting: Family Medicine

## 2008-01-01 ENCOUNTER — Ambulatory Visit: Payer: Self-pay | Admitting: Oncology

## 2008-01-03 LAB — CBC WITH DIFFERENTIAL/PLATELET
BASO%: 2 % (ref 0.0–2.0)
Basophils Absolute: 0.1 10*3/uL (ref 0.0–0.1)
EOS%: 2.3 % (ref 0.0–7.0)
Eosinophils Absolute: 0.2 10*3/uL (ref 0.0–0.5)
HCT: 39.3 % (ref 34.8–46.6)
HGB: 13.3 g/dL (ref 11.6–15.9)
LYMPH%: 31.5 % (ref 14.0–48.0)
MCH: 29.9 pg (ref 26.0–34.0)
MCHC: 34 g/dL (ref 32.0–36.0)
MCV: 88.1 fL (ref 81.0–101.0)
MONO#: 0.5 10*3/uL (ref 0.1–0.9)
MONO%: 7.5 % (ref 0.0–13.0)
NEUT#: 4.1 10*3/uL (ref 1.5–6.5)
NEUT%: 56.7 % (ref 39.6–76.8)
Platelets: 288 10*3/uL (ref 145–400)
RBC: 4.46 10*6/uL (ref 3.70–5.32)
RDW: 15 % — ABNORMAL HIGH (ref 11.3–14.5)
WBC: 7.2 10*3/uL (ref 3.9–10.0)
lymph#: 2.3 10*3/uL (ref 0.9–3.3)

## 2008-01-03 LAB — COMPREHENSIVE METABOLIC PANEL
ALT: 10 U/L (ref 0–35)
AST: 19 U/L (ref 0–37)
Albumin: 4.4 g/dL (ref 3.5–5.2)
Alkaline Phosphatase: 123 U/L — ABNORMAL HIGH (ref 39–117)
BUN: 12 mg/dL (ref 6–23)
CO2: 25 mEq/L (ref 19–32)
Calcium: 9.3 mg/dL (ref 8.4–10.5)
Chloride: 104 mEq/L (ref 96–112)
Creatinine, Ser: 1.01 mg/dL (ref 0.40–1.20)
Glucose, Bld: 95 mg/dL (ref 70–99)
Potassium: 3.9 mEq/L (ref 3.5–5.3)
Sodium: 140 mEq/L (ref 135–145)
Total Bilirubin: 0.4 mg/dL (ref 0.3–1.2)
Total Protein: 7.4 g/dL (ref 6.0–8.3)

## 2008-01-03 LAB — LACTATE DEHYDROGENASE: LDH: 163 U/L (ref 94–250)

## 2008-01-03 LAB — CANCER ANTIGEN 27.29: CA 27.29: 29 U/mL (ref 0–39)

## 2008-01-08 LAB — CELLSEARCH TO QUEST

## 2008-01-11 IMAGING — CR DG CHEST 2V
2 series · 2 of 2 positions shown · non-contrast
Comparison: none

CLINICAL DATA: 51-year-old female, cough and congestion.  
 CHEST - 2 VIEW:

[view not recorded (1 of 2)]
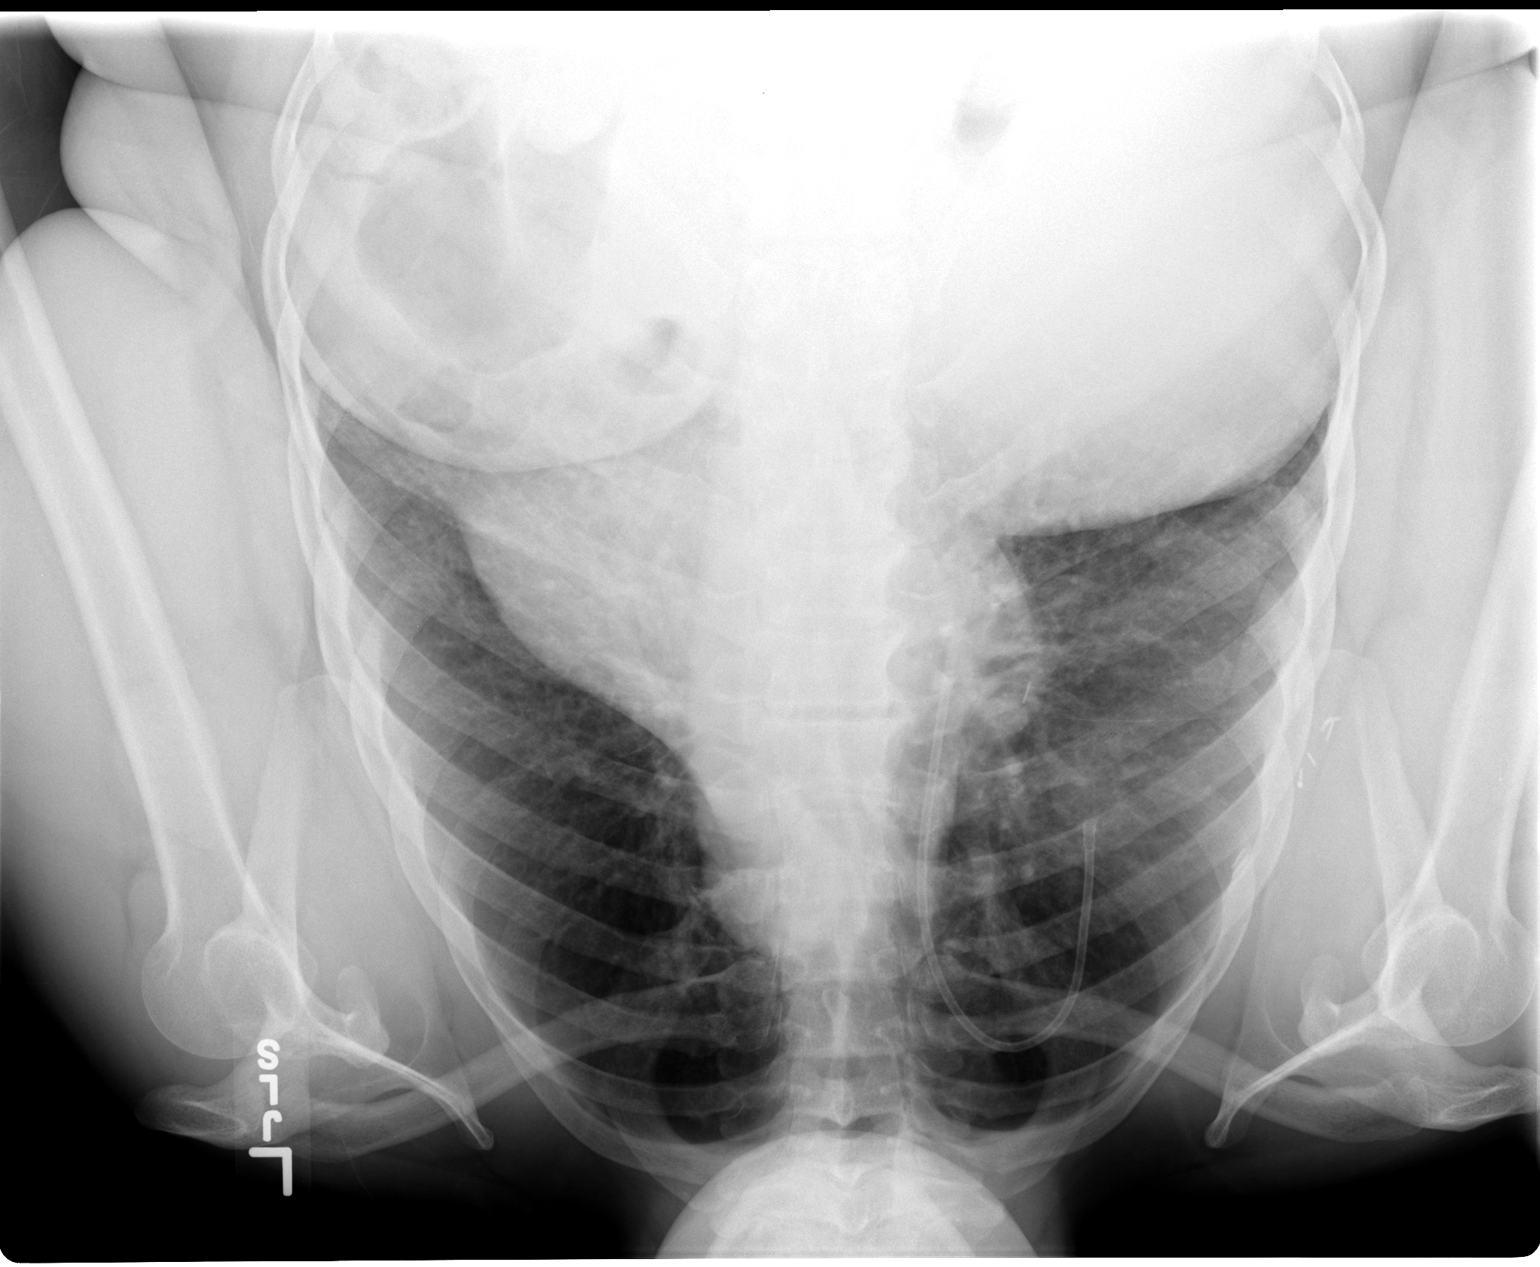

[view not recorded (2 of 2)]
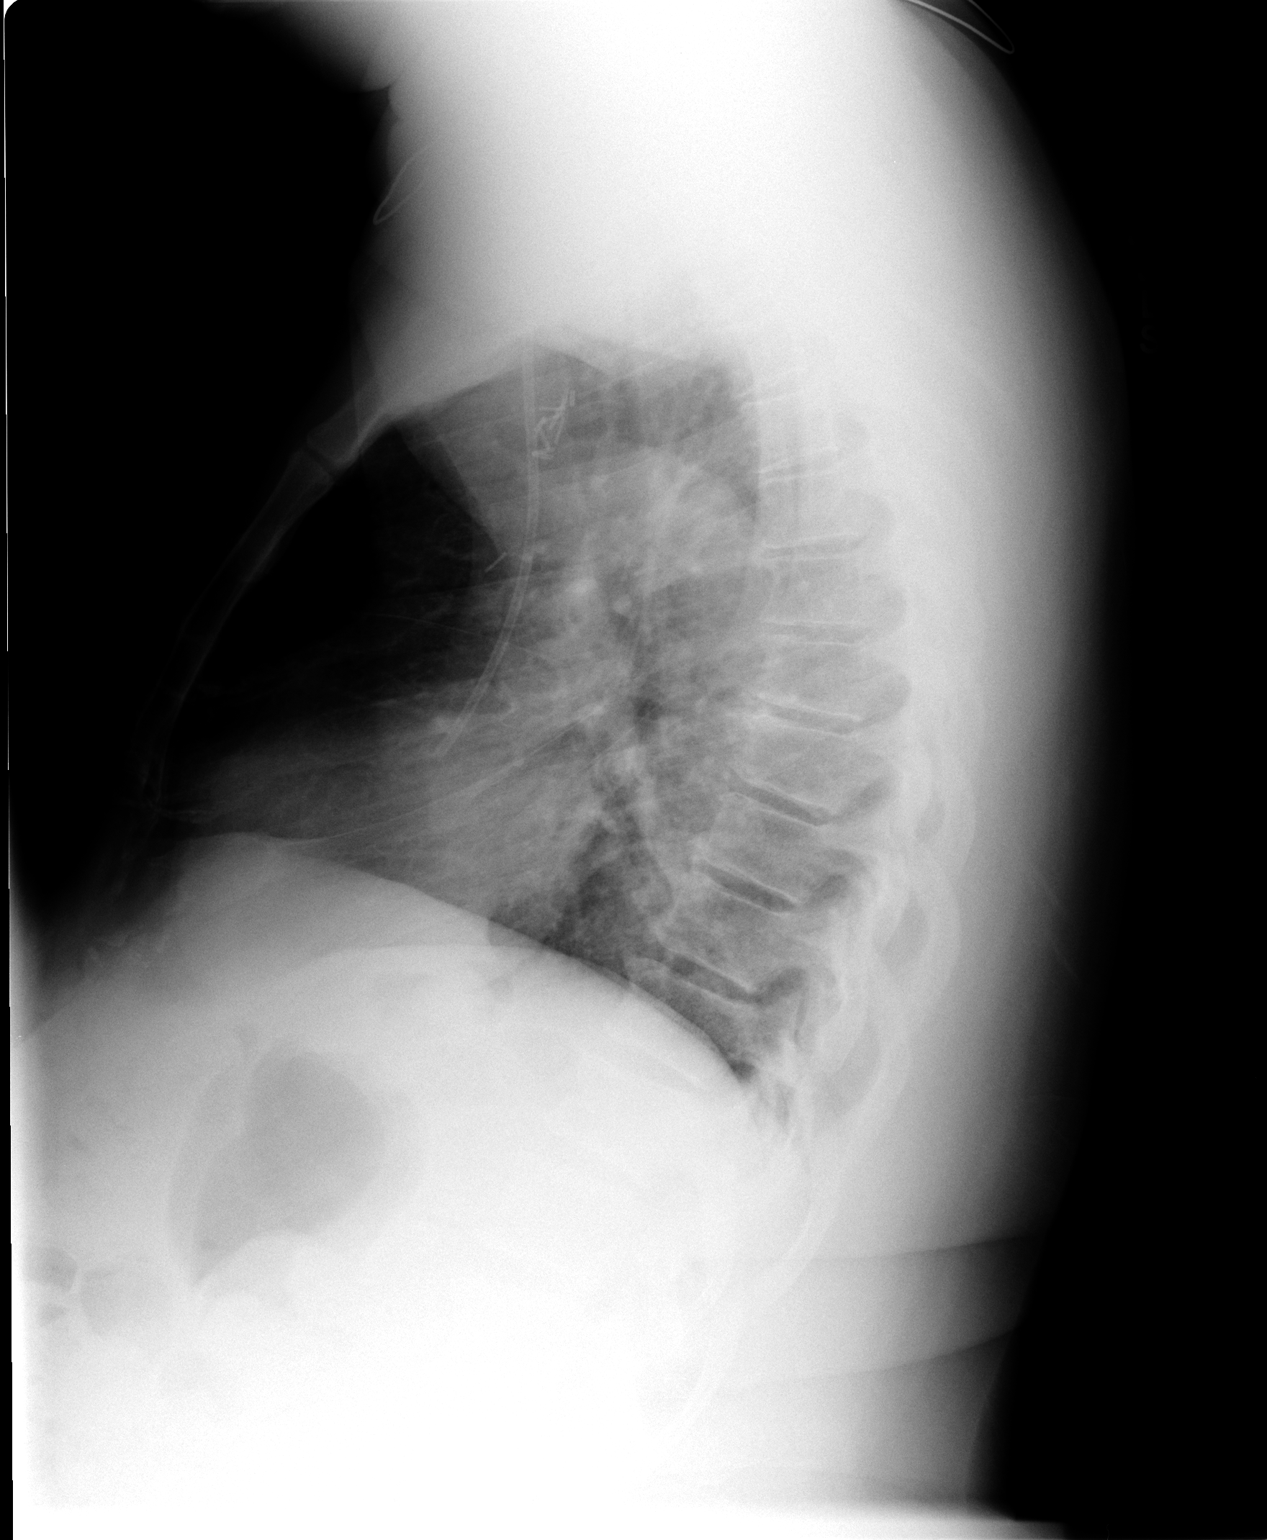

[2 of 2 positions shown; findings below may reference images not displayed]

FINDINGS: Cardiopericardial silhouette is mildly enlarged.  This is exaggerated by low lung volumes.  The lungs are clear.  The visualized soft tissues and bony thorax are unremarkable.  Postsurgical changes are noted in the right axilla.
IMPRESSION: 1.  Borderline cardiomegaly without failure.  
 2.  Postoperative changes right axilla.

## 2008-01-20 ENCOUNTER — Emergency Department (HOSPITAL_COMMUNITY): Admission: EM | Admit: 2008-01-20 | Discharge: 2008-01-21 | Payer: Self-pay | Admitting: Emergency Medicine

## 2008-02-14 ENCOUNTER — Ambulatory Visit: Payer: Self-pay | Admitting: Oncology

## 2008-02-20 ENCOUNTER — Ambulatory Visit (HOSPITAL_COMMUNITY): Admission: RE | Admit: 2008-02-20 | Discharge: 2008-02-20 | Payer: Self-pay | Admitting: Oncology

## 2008-02-20 LAB — CBC WITH DIFFERENTIAL/PLATELET
BASO%: 0 % (ref 0.0–2.0)
Basophils Absolute: 0 10*3/uL (ref 0.0–0.1)
EOS%: 1.7 % (ref 0.0–7.0)
Eosinophils Absolute: 0.1 10*3/uL (ref 0.0–0.5)
HCT: 37.4 % (ref 34.8–46.6)
HGB: 12.8 g/dL (ref 11.6–15.9)
LYMPH%: 18.8 % (ref 14.0–48.0)
MCH: 29.4 pg (ref 26.0–34.0)
MCHC: 34.2 g/dL (ref 32.0–36.0)
MCV: 85.9 fL (ref 81.0–101.0)
MONO#: 0.1 10*3/uL (ref 0.1–0.9)
MONO%: 1 % (ref 0.0–13.0)
NEUT#: 6.1 10*3/uL (ref 1.5–6.5)
NEUT%: 78.5 % — ABNORMAL HIGH (ref 39.6–76.8)
Platelets: 378 10*3/uL (ref 145–400)
RBC: 4.35 10*6/uL (ref 3.70–5.32)
RDW: 15.7 % — ABNORMAL HIGH (ref 11.3–14.5)
WBC: 7.7 10*3/uL (ref 3.9–10.0)
lymph#: 1.5 10*3/uL (ref 0.9–3.3)

## 2008-02-20 LAB — COMPREHENSIVE METABOLIC PANEL
ALT: 16 U/L (ref 0–35)
AST: 23 U/L (ref 0–37)
Albumin: 4.2 g/dL (ref 3.5–5.2)
Alkaline Phosphatase: 105 U/L (ref 39–117)
BUN: 10 mg/dL (ref 6–23)
CO2: 22 mEq/L (ref 19–32)
Calcium: 9.1 mg/dL (ref 8.4–10.5)
Chloride: 107 mEq/L (ref 96–112)
Creatinine, Ser: 0.94 mg/dL (ref 0.40–1.20)
Glucose, Bld: 110 mg/dL — ABNORMAL HIGH (ref 70–99)
Potassium: 4.2 mEq/L (ref 3.5–5.3)
Sodium: 142 mEq/L (ref 135–145)
Total Bilirubin: 0.4 mg/dL (ref 0.3–1.2)
Total Protein: 7.5 g/dL (ref 6.0–8.3)

## 2008-02-26 ENCOUNTER — Ambulatory Visit (HOSPITAL_COMMUNITY): Admission: RE | Admit: 2008-02-26 | Discharge: 2008-02-26 | Payer: Self-pay | Admitting: Oncology

## 2008-02-26 LAB — CELLSEARCH TO QUEST

## 2008-03-24 ENCOUNTER — Ambulatory Visit: Payer: Self-pay | Admitting: Oncology

## 2008-03-24 LAB — CBC WITH DIFFERENTIAL/PLATELET
BASO%: 1 % (ref 0.0–2.0)
Basophils Absolute: 0.1 10*3/uL (ref 0.0–0.1)
EOS%: 4.1 % (ref 0.0–7.0)
Eosinophils Absolute: 0.2 10*3/uL (ref 0.0–0.5)
HCT: 38.7 % (ref 34.8–46.6)
HGB: 13.4 g/dL (ref 11.6–15.9)
LYMPH%: 24.4 % (ref 14.0–48.0)
MCH: 30.3 pg (ref 26.0–34.0)
MCHC: 34.5 g/dL (ref 32.0–36.0)
MCV: 87.8 fL (ref 81.0–101.0)
MONO#: 0.4 10*3/uL (ref 0.1–0.9)
MONO%: 6.8 % (ref 0.0–13.0)
NEUT#: 3.9 10*3/uL (ref 1.5–6.5)
NEUT%: 63.7 % (ref 39.6–76.8)
Platelets: 292 10*3/uL (ref 145–400)
RBC: 4.41 10*6/uL (ref 3.70–5.32)
RDW: 17.3 % — ABNORMAL HIGH (ref 11.3–14.5)
WBC: 6.1 10*3/uL (ref 3.9–10.0)
lymph#: 1.5 10*3/uL (ref 0.9–3.3)

## 2008-03-24 LAB — COMPREHENSIVE METABOLIC PANEL
ALT: 20 U/L (ref 0–35)
AST: 28 U/L (ref 0–37)
Albumin: 4.2 g/dL (ref 3.5–5.2)
Alkaline Phosphatase: 112 U/L (ref 39–117)
BUN: 9 mg/dL (ref 6–23)
CO2: 26 mEq/L (ref 19–32)
Calcium: 9.3 mg/dL (ref 8.4–10.5)
Chloride: 100 mEq/L (ref 96–112)
Creatinine, Ser: 0.87 mg/dL (ref 0.40–1.20)
Glucose, Bld: 108 mg/dL — ABNORMAL HIGH (ref 70–99)
Potassium: 3.4 mEq/L — ABNORMAL LOW (ref 3.5–5.3)
Sodium: 141 mEq/L (ref 135–145)
Total Bilirubin: 0.5 mg/dL (ref 0.3–1.2)
Total Protein: 7.3 g/dL (ref 6.0–8.3)

## 2008-03-24 LAB — LACTATE DEHYDROGENASE: LDH: 197 U/L (ref 94–250)

## 2008-04-05 IMAGING — PT NM PET TUM IMG SKULL BASE T - THIGH
6 series · 25 of 25 positions shown · non-contrast
Comparison: 07/30/07.

CLINICAL DATA: Breast cancer.
 FDG PET-CT TUMOR IMAGING (SKULL BASE TO THIGHS):

 Fasting Blood Glucose:  107.
TECHNIQUE: 17.8 mCi F-18 FDG were administered via left foot.  Full ring PET imaging was performed from the skull base through the mid-thighs 58 minutes after injection.  CT data was obtained and used for attenuation correction and anatomic localization only.  (This was not acquired as a diagnostic CT examination.)

[Series 1: pet ac · axial · 3.3mm · 4.69mm/px · z∈[-870,+0]mm · 5 of 267 slices shown]
[im 1/267]
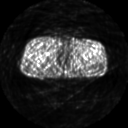
[im 67/267]
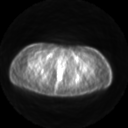
[im 134/267]
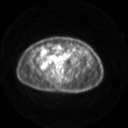
[im 200/267]
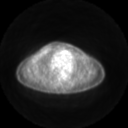
[im 267/267]
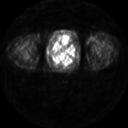

[Series 2: ct images · axial · 3.8mm · 0.98mm/px · z∈[-870,+0]mm · 6 of 262 slices shown]
[im 1/262]
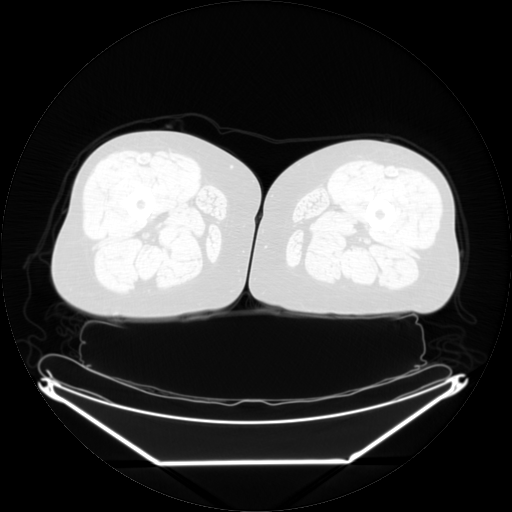
[im 53/262]
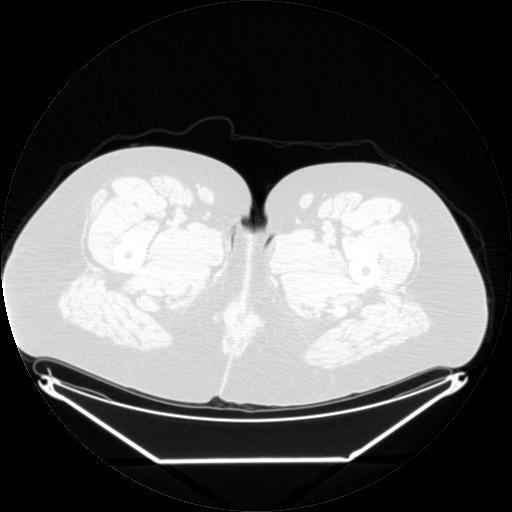
[im 105/262]
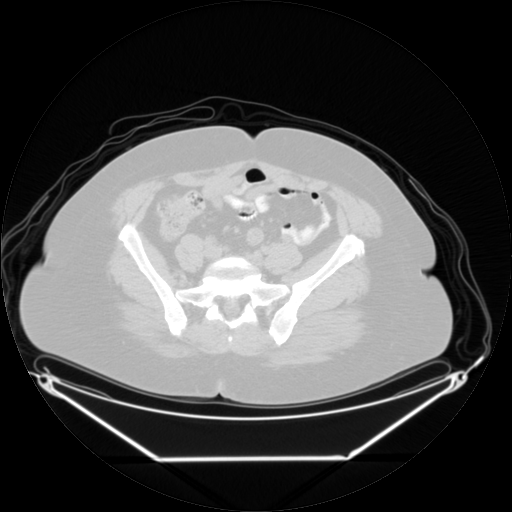
[im 157/262]
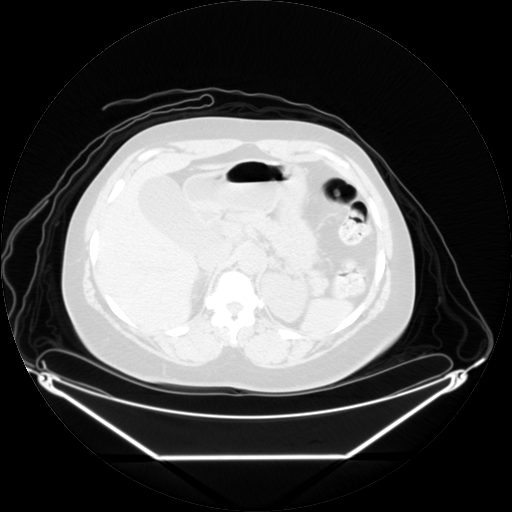
[im 209/262]
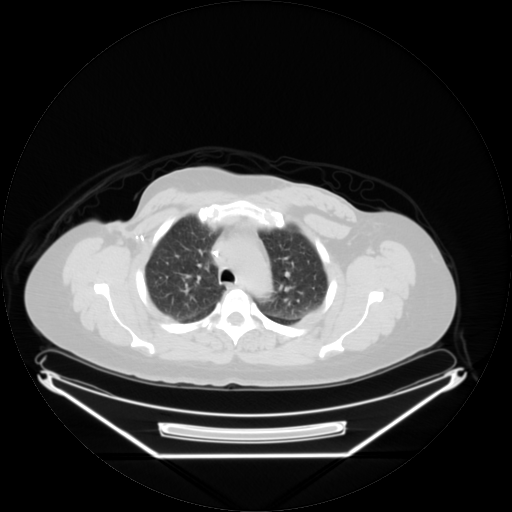
[im 262/262  brain]
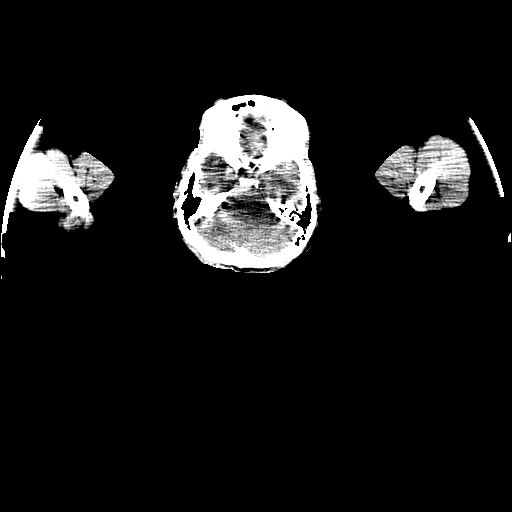

[Series 2: pet nac · axial · 3.3mm · 4.69mm/px · z∈[-870,+0]mm · 6 of 267 slices shown]
[im 1/267]
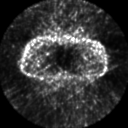
[im 54/267]
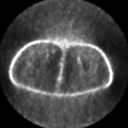
[im 107/267]
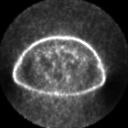
[im 160/267]
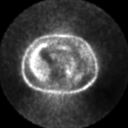
[im 213/267]
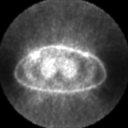
[im 267/267]
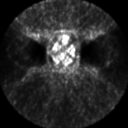

[Series 123: mip · coronal · 3.3mm · 4.69mm/px · 1 of 30 slices shown]
[im 1/30]
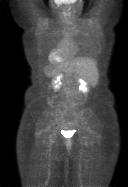

[Series 151: reformatted · axial · 3.3mm · 3.91mm/px · z∈[-847,-13]mm · 6 of 256 slices shown (1 of 2)]
[im 1/256]
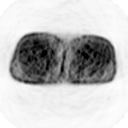
[im 52/256]
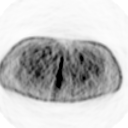
[im 103/256]
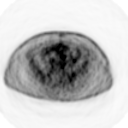
[im 154/256]
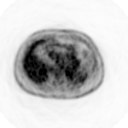
[im 205/256]
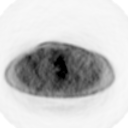
[im 256/256]
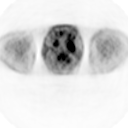

[Series 153: reformatted · coronal · 4.7mm · 6.98mm/px · 1 of 61 slices shown (2 of 2)]
[im 1/61]
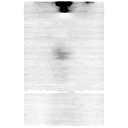

[25 of 25 positions shown; findings below may reference images not displayed]

FINDINGS: There are no hypermetabolic lymph nodes within the soft tissues of the neck.
 The previously described hypermetabolic right axillary and right retropectoral lymph nodes have resolved in the interval.
 There is no hypermetabolic left axillary lymph nodes.
 There is no hypermetabolic supraclavicular, mediastinal, or hilar lymph nodes.
 The heart size is enlarged.  There is no pericardial or pleural fluid.
 Radiation changes identified within the subpleural right upper lobe anteriorly.
 Tiny left lower lobe pulmonary nodule measures 4 mm, image 36.  This is stable from prior study.
 There is mild multilevel thoracic spondylosis.
 No lytic or sclerotic lesions identified.
 No abnormal FDG uptake is identified within the liver parenchyma.
 Both of the adrenal glands are normal.  There are no hypermetabolic retroperitoneal or small bowel mesenteric lymph nodes.
 No pericardial or pleural fluid is identified.
 No abnormal foci of increased uptake are seen within the upper abdomen or the pelvis.
 No evidence for hypermetabolic bone metastases.
IMPRESSION: Interval resolution of right axillary and right retropectoral lymph node metastasis.  Currently there is no evidence for hypermetabolic tumor.

## 2008-04-05 IMAGING — CT CT HEAD WO/W CM
2 of 7 series · 14 of 30 positions shown, 17 images · IV contrast (APPLIED)
Comparison: 07/30/07.

CLINICAL DATA: HEAD CT WITHOUT AND WITH CONTRAST:
TECHNIQUE: Contiguous axial images were obtained from the base of the skull through the vertex according to standard protocol before and after administration of intravenous contrast.
Contrast:  125 cc Omnipaque 300.
TECHNIQUE: Multidetector CT imaging of the chest was performed following the standard protocol during bolus administration of intravenous contrast.
TECHNIQUE: Multidetector CT imaging of the abdomen was performed following the standard protocol during bolus administration of intravenous contrast.
TECHNIQUE: Multidetector CT imaging of the pelvis was performed following the standard protocol during bolus administration of intravenous contrast.

[Series 4: cap 5.0 b40s st · axial · 0.74mm/px · z∈[+602,+1082]mm · 10 of 118 slices shown, 13 images]
[im 11/118  brain]
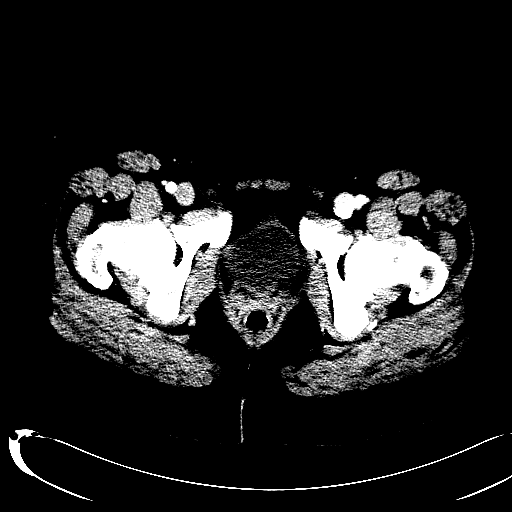
[im 11/118  bone]
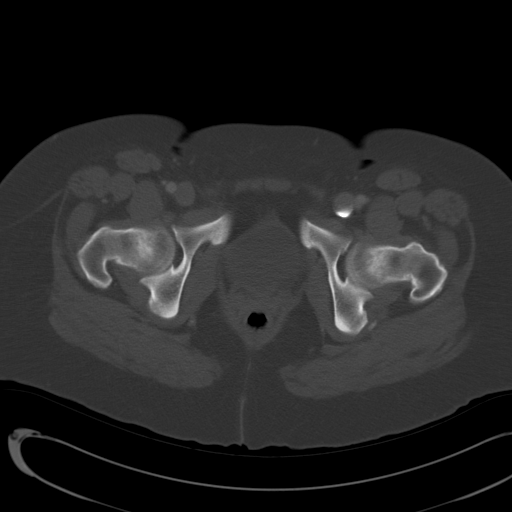
[im 22/118  brain]
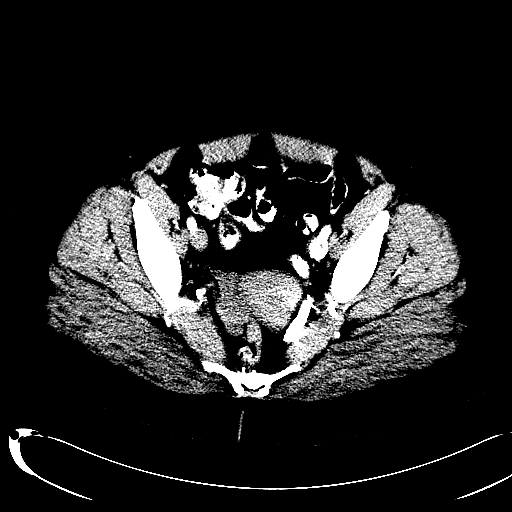
[im 32/118  brain]
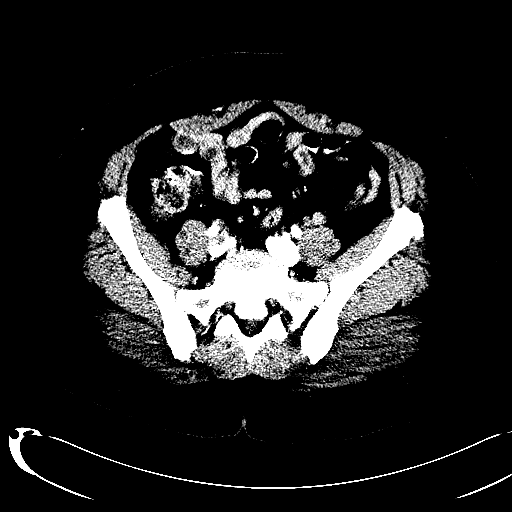
[im 43/118  brain]
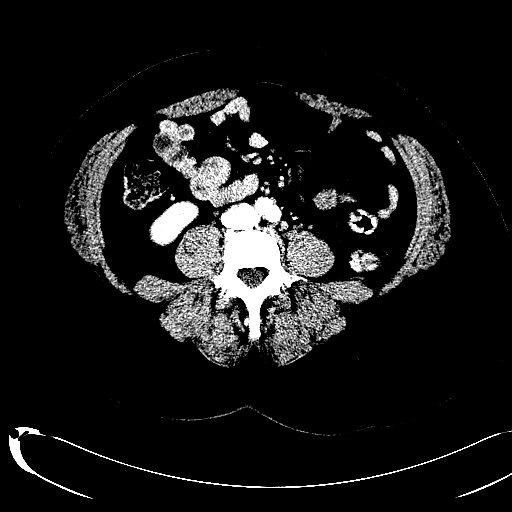
[im 54/118  brain]
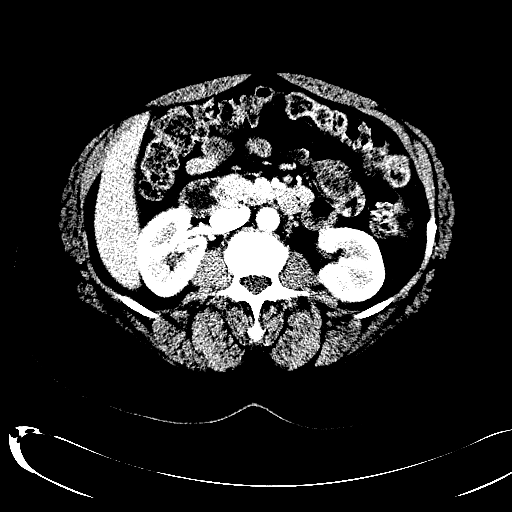
[im 54/118  bone]
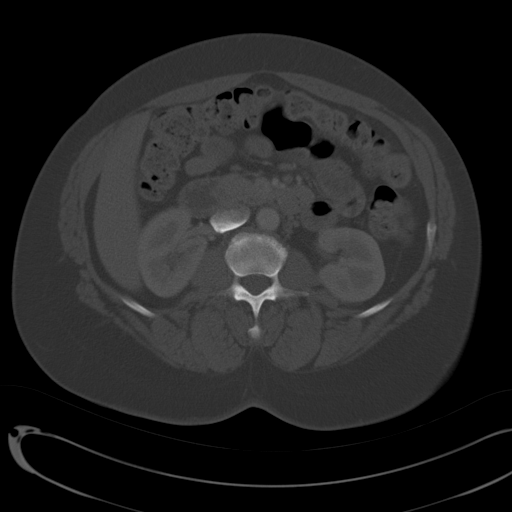
[im 64/118  brain]
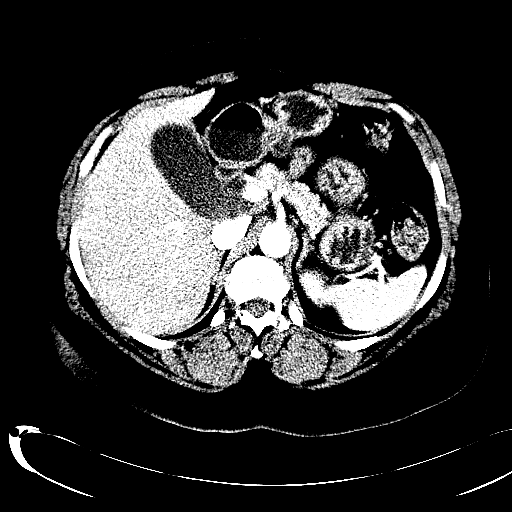
[im 75/118  brain]
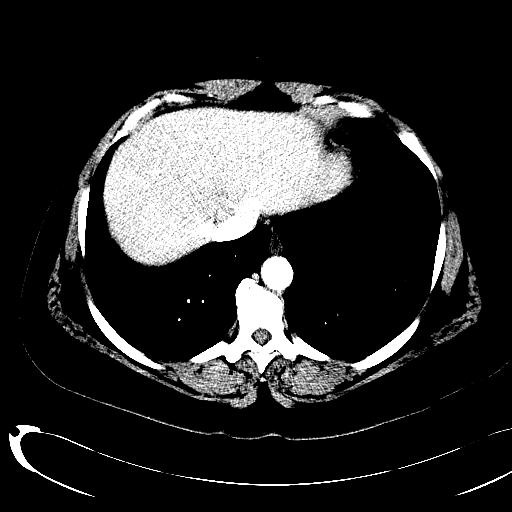
[im 86/118  brain]
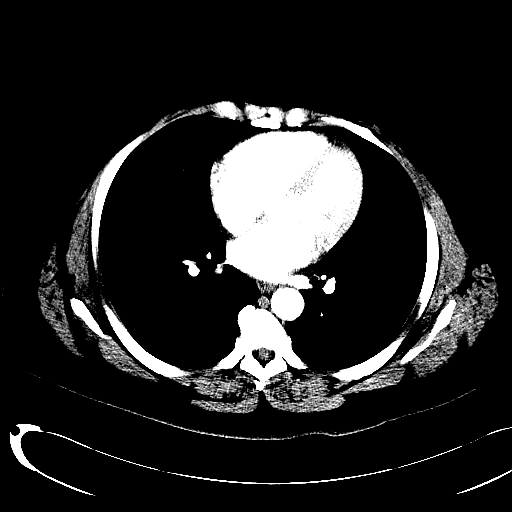
[im 96/118  brain]
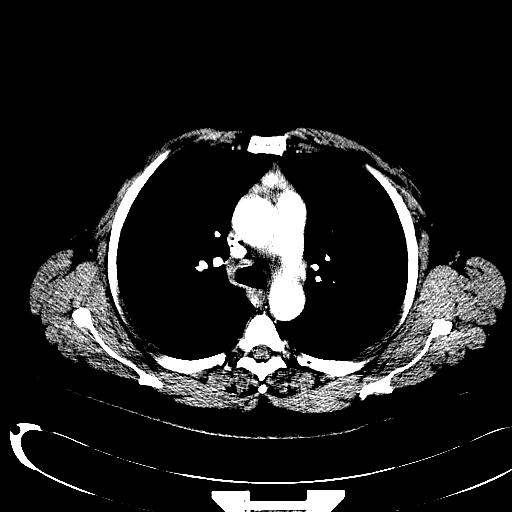
[im 96/118  bone]
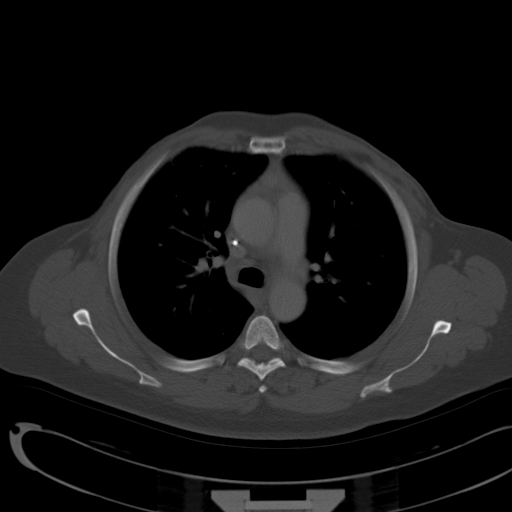
[im 107/118  brain]
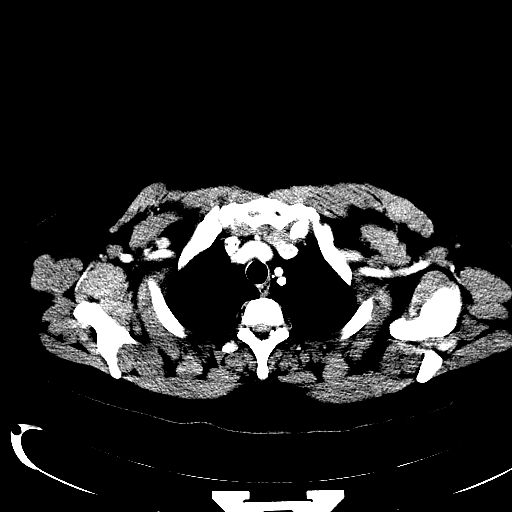

[Series 6: cap 5.0 b70f lung · axial · 0.74mm/px · z∈[+906,+1076]mm · 4 of 58 slices shown]
[im 12/58  bone]
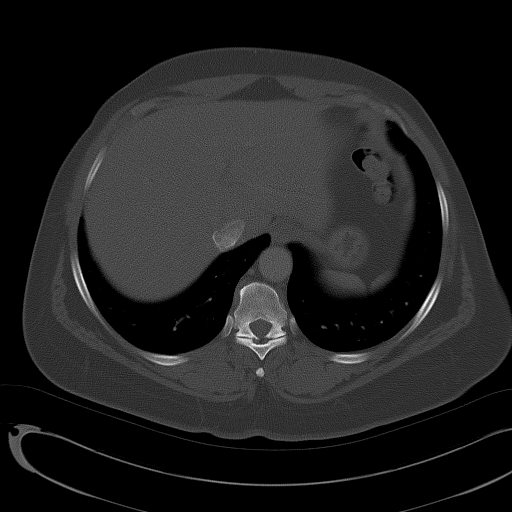
[im 23/58  bone]
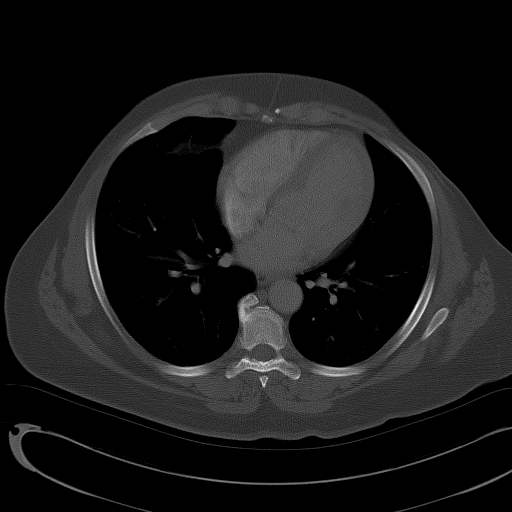
[im 35/58  bone]
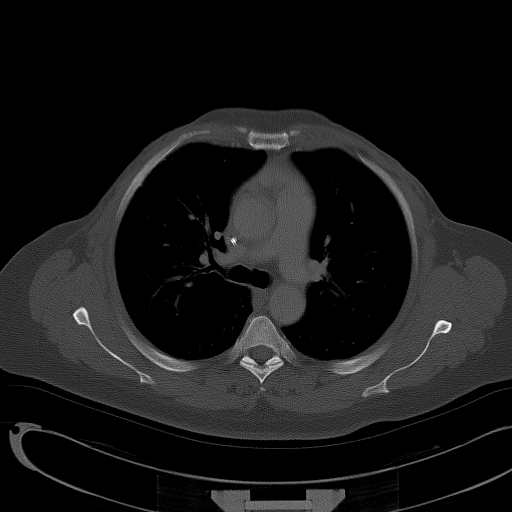
[im 46/58  bone]
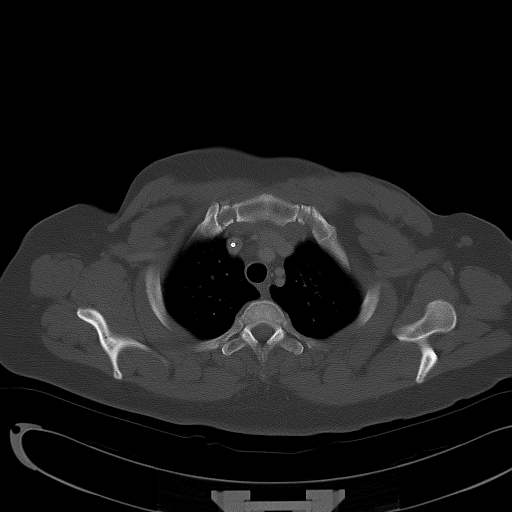

[14 of 30 positions shown; findings below may reference images not displayed]

FINDINGS: The cerebral and the cerebellar hemispheres are normal in attenuation, morphology and enhancement.  
The ventricular volumes are within normal limits.   The midline is maintained.  
There is no abnormal extra-axial fluid collection, intracranial hemorrhage or mass. 
Review of the bone windows shows no evidence for calvarial metastases.
IMPRESSION: Negative for brain metastases. 
CHEST CT WITH CONTRAST:
FINDINGS: Postop changes from bilateral mastectomies and right axillary lymph node dissection noted.   The previously noted hypermetabolic lymph nodes have resolved.  No pathologically enlarged axillary or retropectoral lymph nodes are noted on the current exam.   There are no enlarged supraclavicular lymph nodes.  
Negative for pathologically enlarged mediastinal or hilar lymph nodes.  
No pericardial or pleural fluid. 
The pleural radiation change is identified within the anterior right upper lobe.
Small pulmonary nodule within the right upper lobe measures 3.7 mm, image 36.  
Review of the bone windows shows multilevel thoracic spondylosis.  
No suspicious lytic or sclerotic lesions noted.
IMPRESSION: 1.  Interval resolution of right axillary and retropectoral lymph nodes.  
2.  No new findings. 
ABDOMEN CT WITH CONTRAST:
FINDINGS: Tiny hypodensity in the anterior aspect of the left hepatic lobe measures 4.3 mm, image 46.    This was difficult to identify on the previous exam. 
Probable cyst in the right hepatic lobe measure 7 mm, image 62, stable from previous exams.  The spleen is negative.  The adrenal glands are negative.   Mild increase in caliber of common bile duct measuring up to 9.8 mm vs 8.2 previously.  
There is no enlarged retroperitoneal or small bowel mesenteric lymph nodes.  There is no free fluid or abnormal fluid collections. 
Review of bone windows shows no lytic or sclerotic lesions. There is multilevel lumbar spondylosis.
IMPRESSION: 1.  Negative for mass or adenopathy.
2.   The only new finding compared with the prior exam is a tiny approximately 4 mm hypodensity in the left hepatic lobe which could not be found on previous exams.  The small size of this hypodensity may explain these differences.  Nevertheless, attention on follow-up examination is recommended.  The remaining portions of the upper abdomen are stable.  
3.  There is mild increase in caliber of common bile duct.   Careful clinical correlation suggested. 
PELVIS CT WITH CONTRAST:
FINDINGS: No pathologically enlarged pelvic or inguinal lymph nodes.   
No free fluid or abnormal fluid collections are noted.   
Review of the bone windows is unremarkable.
IMPRESSION: Negative for mass or adenopathy.

## 2008-05-02 LAB — CBC WITH DIFFERENTIAL/PLATELET
BASO%: 0.9 % (ref 0.0–2.0)
Basophils Absolute: 0.1 10*3/uL (ref 0.0–0.1)
EOS%: 3.6 % (ref 0.0–7.0)
Eosinophils Absolute: 0.2 10*3/uL (ref 0.0–0.5)
HCT: 36.3 % (ref 34.8–46.6)
HGB: 12.4 g/dL (ref 11.6–15.9)
LYMPH%: 38.3 % (ref 14.0–48.0)
MCH: 29.9 pg (ref 26.0–34.0)
MCHC: 34.2 g/dL (ref 32.0–36.0)
MCV: 87.2 fL (ref 81.0–101.0)
MONO#: 0.3 10*3/uL (ref 0.1–0.9)
MONO%: 5.7 % (ref 0.0–13.0)
NEUT#: 2.8 10*3/uL (ref 1.5–6.5)
NEUT%: 51.5 % (ref 39.6–76.8)
Platelets: 292 10*3/uL (ref 145–400)
RBC: 4.16 10*6/uL (ref 3.70–5.32)
RDW: 18 % — ABNORMAL HIGH (ref 11.3–14.5)
WBC: 5.5 10*3/uL (ref 3.9–10.0)
lymph#: 2.1 10*3/uL (ref 0.9–3.3)

## 2008-05-05 ENCOUNTER — Ambulatory Visit: Payer: Self-pay | Admitting: Oncology

## 2008-05-07 LAB — CANCER ANTIGEN 27.29: CA 27.29: 33 U/mL (ref 0–39)

## 2008-05-07 LAB — COMPREHENSIVE METABOLIC PANEL
ALT: 10 U/L (ref 0–35)
AST: 17 U/L (ref 0–37)
Albumin: 4.1 g/dL (ref 3.5–5.2)
Alkaline Phosphatase: 97 U/L (ref 39–117)
BUN: 11 mg/dL (ref 6–23)
CO2: 23 mEq/L (ref 19–32)
Calcium: 9.2 mg/dL (ref 8.4–10.5)
Chloride: 107 mEq/L (ref 96–112)
Creatinine, Ser: 0.82 mg/dL (ref 0.40–1.20)
Glucose, Bld: 104 mg/dL — ABNORMAL HIGH (ref 70–99)
Potassium: 3.8 mEq/L (ref 3.5–5.3)
Sodium: 141 mEq/L (ref 135–145)
Total Bilirubin: 0.2 mg/dL — ABNORMAL LOW (ref 0.3–1.2)
Total Protein: 7 g/dL (ref 6.0–8.3)

## 2008-05-07 LAB — LACTATE DEHYDROGENASE: LDH: 169 U/L (ref 94–250)

## 2008-05-22 ENCOUNTER — Ambulatory Visit (HOSPITAL_COMMUNITY): Admission: RE | Admit: 2008-05-22 | Discharge: 2008-05-22 | Payer: Self-pay | Admitting: Oncology

## 2008-06-04 IMAGING — CR DG CHEST 2V
2 series · 2 of 2 positions shown · non-contrast
Comparison: none

CLINICAL DATA: Cough and congestion.  Weakness.
 CHEST- 2 VIEWS - 01/20/08:

[w chest pa *]
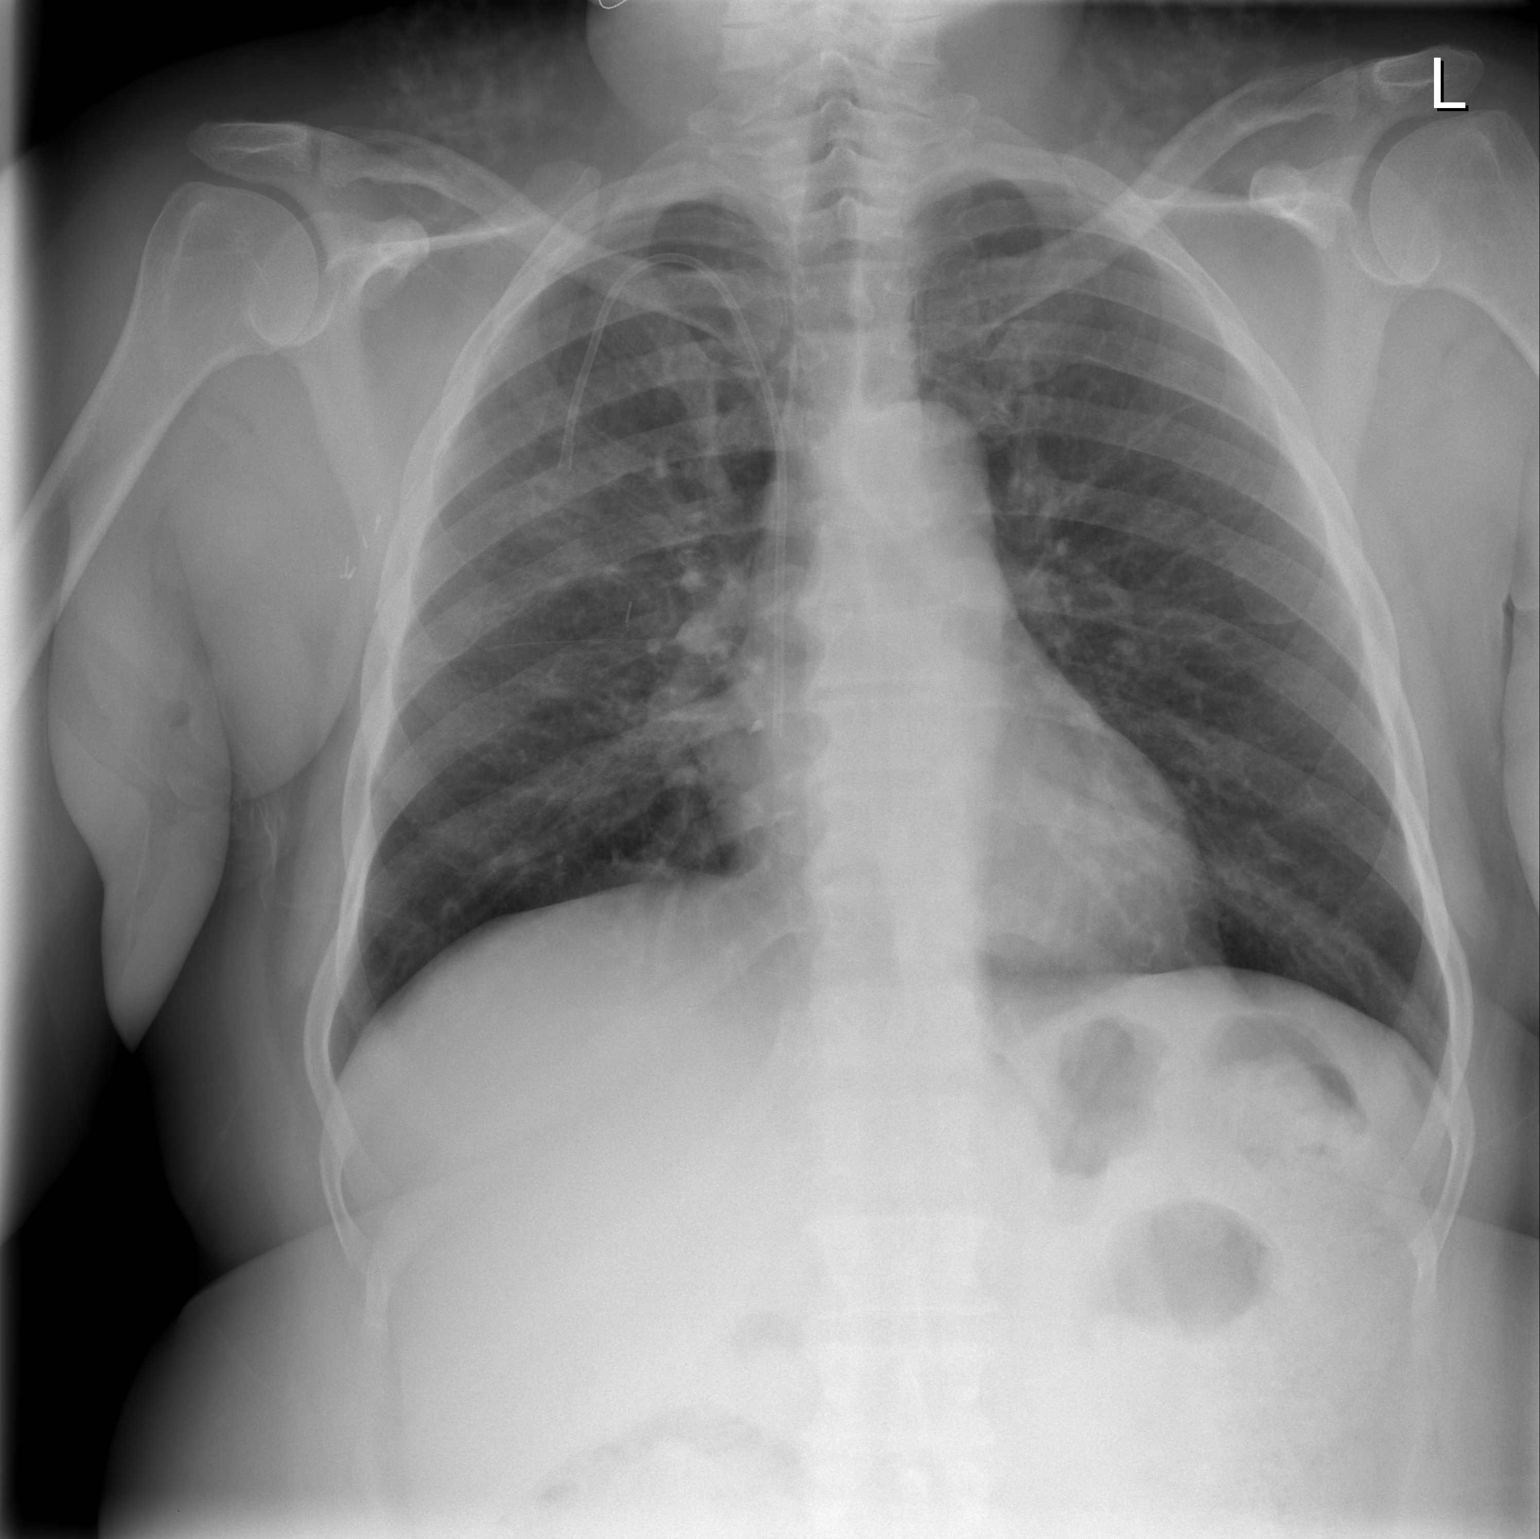

[w chest lat *]
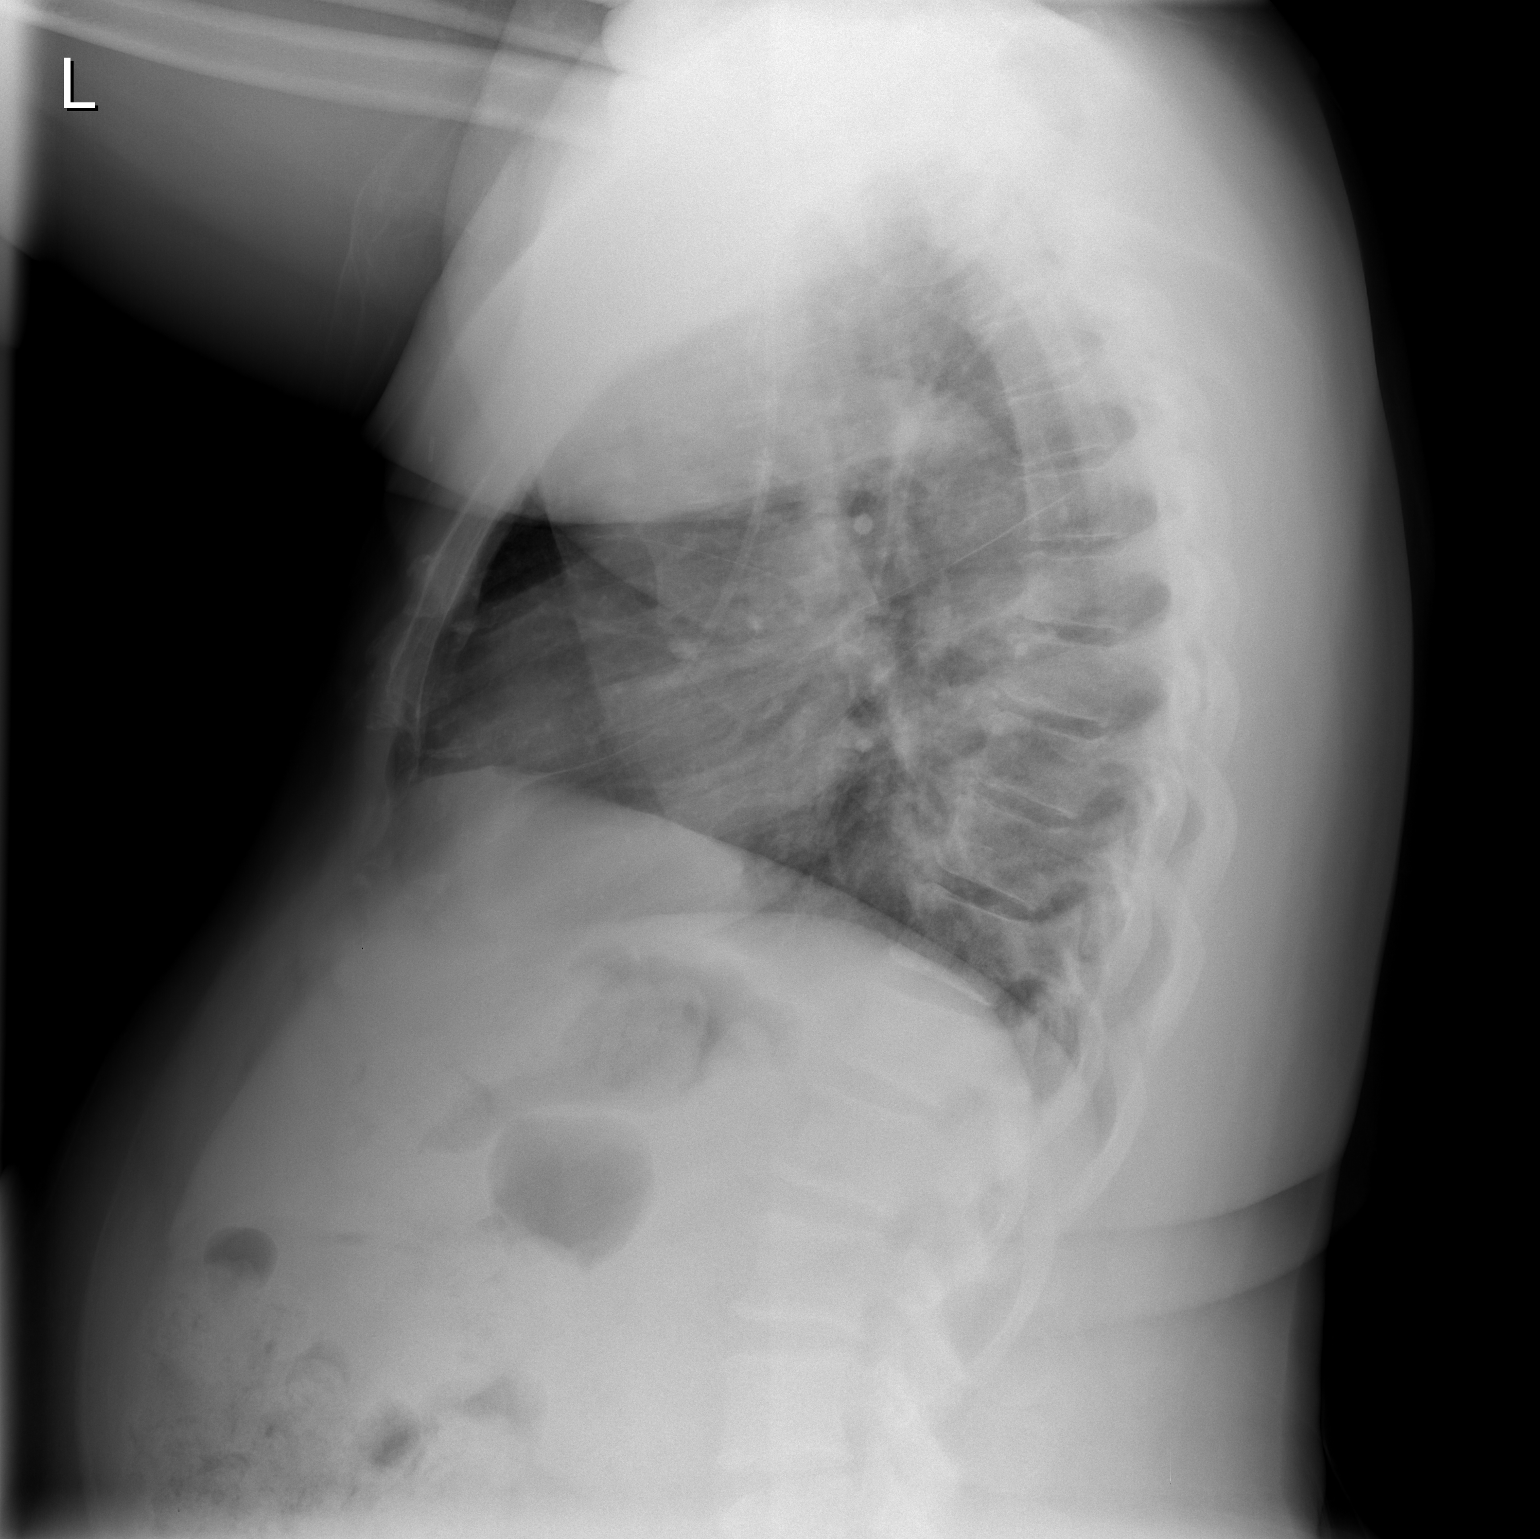

[2 of 2 positions shown; findings below may reference images not displayed]

FINDINGS: The heart size and mediastinal contours are unremarkable.  
 There is no pleural fluid or pulmonary edema.
 No air space opacities are identified.  
 There is a right chest wall port-a-catheter with the tip in the SVC.
IMPRESSION: No acute cardiopulmonary abnormalities.

## 2008-06-12 ENCOUNTER — Ambulatory Visit (HOSPITAL_COMMUNITY): Admission: RE | Admit: 2008-06-12 | Discharge: 2008-06-12 | Payer: Self-pay | Admitting: Oncology

## 2008-06-12 LAB — CBC WITH DIFFERENTIAL/PLATELET
BASO%: 2.3 % — ABNORMAL HIGH (ref 0.0–2.0)
Basophils Absolute: 0.1 10*3/uL (ref 0.0–0.1)
EOS%: 1.1 % (ref 0.0–7.0)
Eosinophils Absolute: 0.1 10*3/uL (ref 0.0–0.5)
HCT: 40.6 % (ref 34.8–46.6)
HGB: 13.6 g/dL (ref 11.6–15.9)
LYMPH%: 26.3 % (ref 14.0–48.0)
MCH: 29.6 pg (ref 26.0–34.0)
MCHC: 33.4 g/dL (ref 32.0–36.0)
MCV: 88.6 fL (ref 81.0–101.0)
MONO#: 0.4 10*3/uL (ref 0.1–0.9)
MONO%: 7.7 % (ref 0.0–13.0)
NEUT#: 3.2 10*3/uL (ref 1.5–6.5)
NEUT%: 62.7 % (ref 39.6–76.8)
Platelets: 248 10*3/uL (ref 145–400)
RBC: 4.58 10*6/uL (ref 3.70–5.32)
RDW: 14.4 % (ref 11.3–14.5)
WBC: 5.2 10*3/uL (ref 3.9–10.0)
lymph#: 1.4 10*3/uL (ref 0.9–3.3)

## 2008-07-05 IMAGING — PT NM PET TUM IMG SKULL BASE T - THIGH
6 series · 25 of 25 positions shown · non-contrast
Comparison: PET CT 11/21/2007

CLINICAL DATA: BREAST CA; ;

NUCLEAR MEDICINE  PET/CT
Fasting Blood Glucose:  101
TECHNIQUE: 17.3 mCi F-18 FDG was injected intravenously via the
left foot.  Full-ring PET imaging was performed from the skull base
through the mid-thighs 70   minutes after injection.  CT data was
obtained and used for attenuation correction and anatomic
localization only.  (This was not acquired as a diagnostic CT
examination.)

[Series 1: pet ac · axial · 3.3mm · 4.69mm/px · z∈[-870,+0]mm · 5 of 267 slices shown]
[im 1/267]
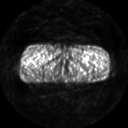
[im 67/267]
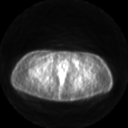
[im 134/267]
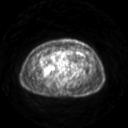
[im 200/267]
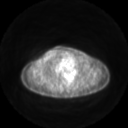
[im 267/267]
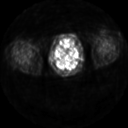

[Series 2: pet nac · axial · 3.3mm · 4.69mm/px · z∈[-870,+0]mm · 6 of 267 slices shown]
[im 1/267]
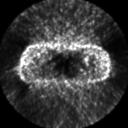
[im 54/267]
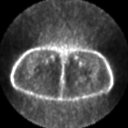
[im 107/267]
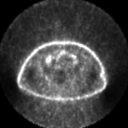
[im 160/267]
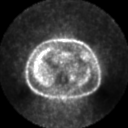
[im 213/267]
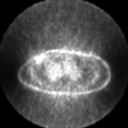
[im 267/267]
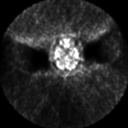

[Series 2: ct images · axial · 3.8mm · 0.98mm/px · z∈[-870,+0]mm · 5 of 263 slices shown]
[im 1/263]
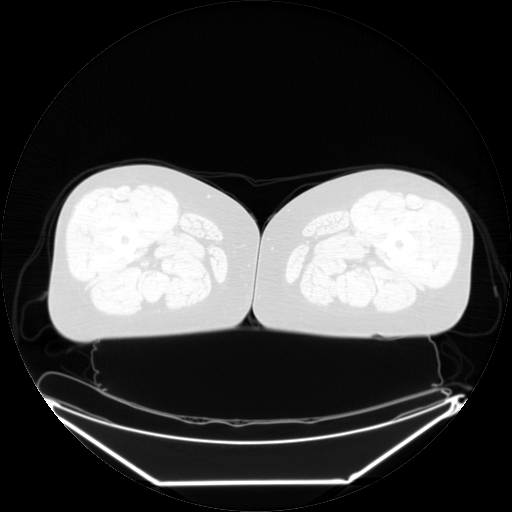
[im 66/263]
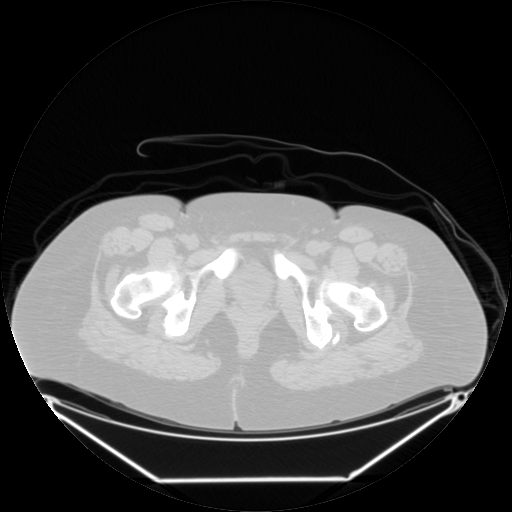
[im 132/263]
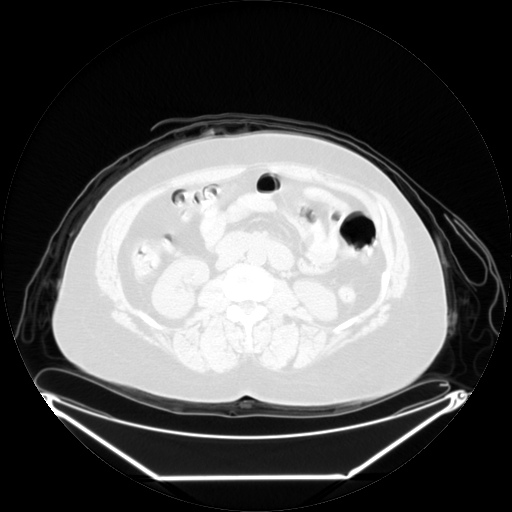
[im 197/263]
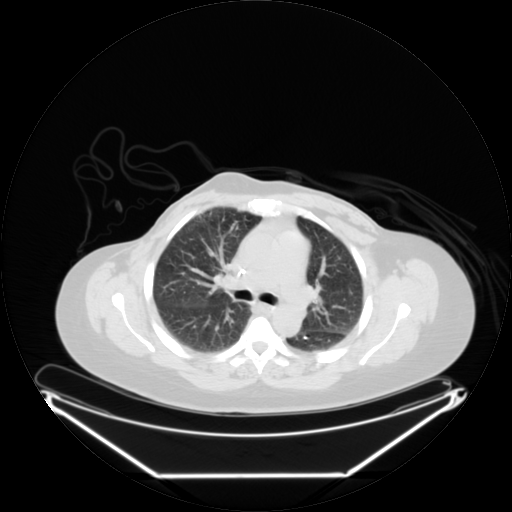
[im 263/263  brain]
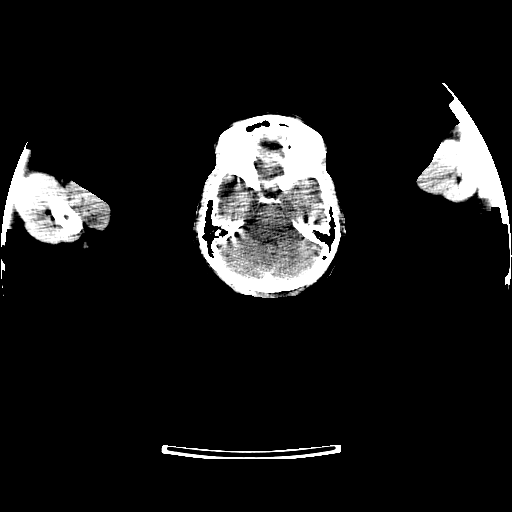

[Series 123: mip · coronal · 3.3mm · 4.69mm/px · 1 of 30 slices shown]
[im 1/30]
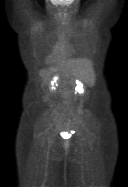

[Series 151: reformatted · axial · 3.3mm · 3.91mm/px · z∈[-870,+0]mm · 6 of 265 slices shown (1 of 2)]
[im 1/265]
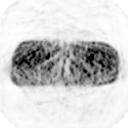
[im 53/265]
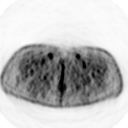
[im 106/265]
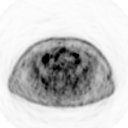
[im 159/265]
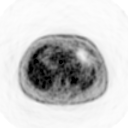
[im 212/265]
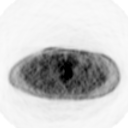
[im 265/265]
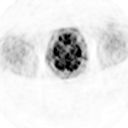

[Series 153: reformatted · coronal · 4.7mm · 6.98mm/px · 2 of 71 slices shown (2 of 2)]
[im 1/71]
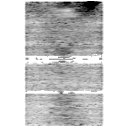
[im 71/71]
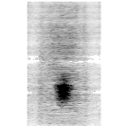

[25 of 25 positions shown; findings below may reference images not displayed]

FINDINGS: Neck: No hypermetabolic nodes

Chest: No mediastinal or axillary hypermetabolic nodes. .

Abdomen / Pelvis: Normal activity solid organ activiy.  No
hypermetabolic lymphadenopathy. Mild active associated with the
inguinal lymph nodes which are unchanged in size from prior are
likely reactive.

Skeleton: No focal activity to suggest skeletal metastasis.
IMPRESSION: 1.  No evidence of breast cancer recurrence.

## 2008-07-05 IMAGING — CT CT CHEST W/ CM
3 of 8 series · 15 of 36 positions shown, 18 images · IV contrast (omnipaque)
Comparison: CT head, chest abdomen and pelvis 11/21/2007

CT HEAD

CLINICAL DATA: Breast cancer restaging

CT HEAD WITHOUT CONTRAST
CT CHEST, ABDOMEN AND PELVIS WITH CONTRAST
TECHNIQUE: Multidetector CT imaging of the head was performed
following the standard protocol without IV contrast. Technique:
Multidetector CT imaging of the chest, abdomen and pelvis was
performed following the standard protocol after bolus
administration of intravenous contrast.
Contrast: 100ml Omnipaque

[Series 5: cap 5.0 b40f · axial · 0.68mm/px · z∈[+599,+1059]mm · 9 of 116 slices shown, 12 images]
[im 12/116  mediastinal]
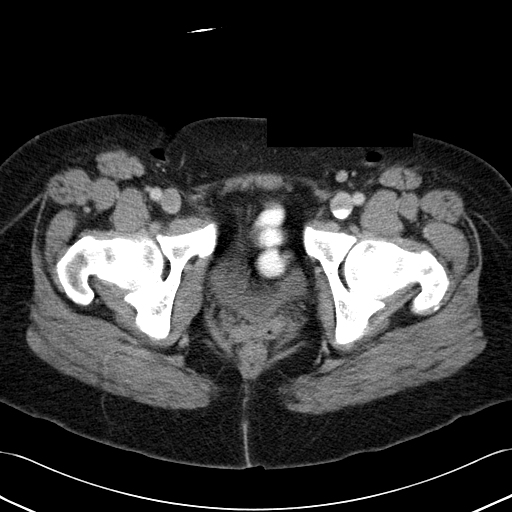
[im 12/116  lung]
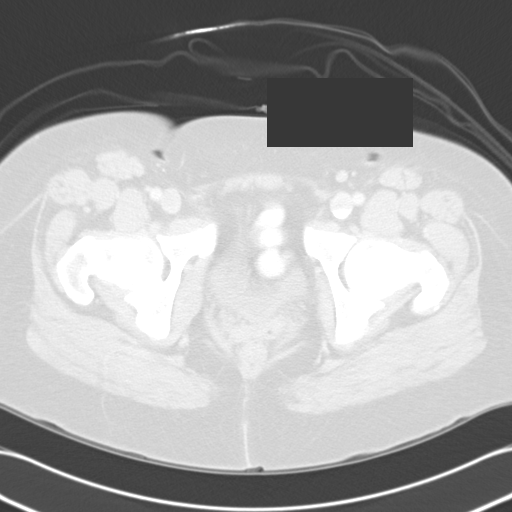
[im 24/116  lung]
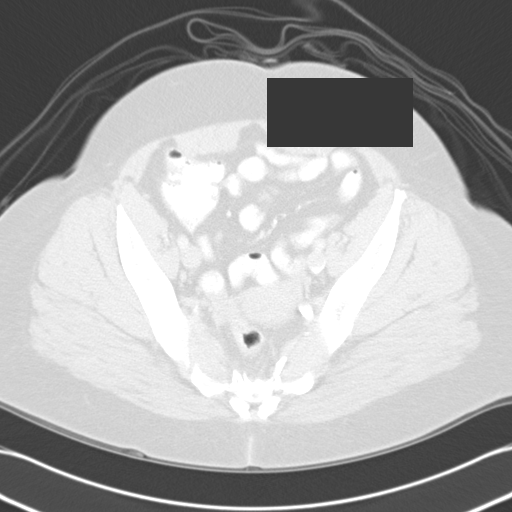
[im 35/116  lung]
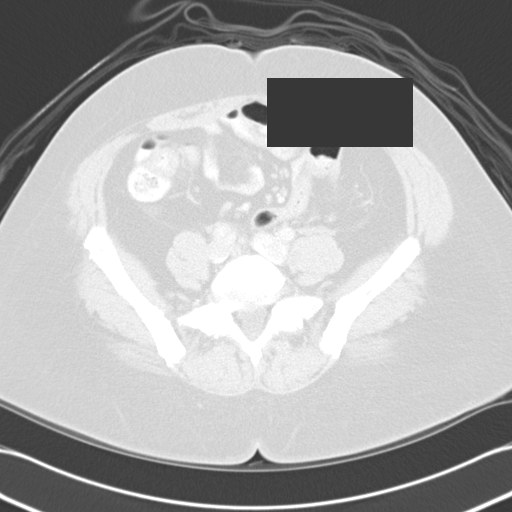
[im 47/116  lung]
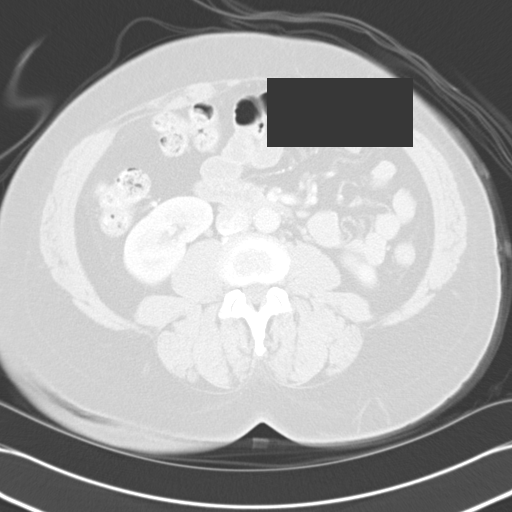
[im 58/116  mediastinal]
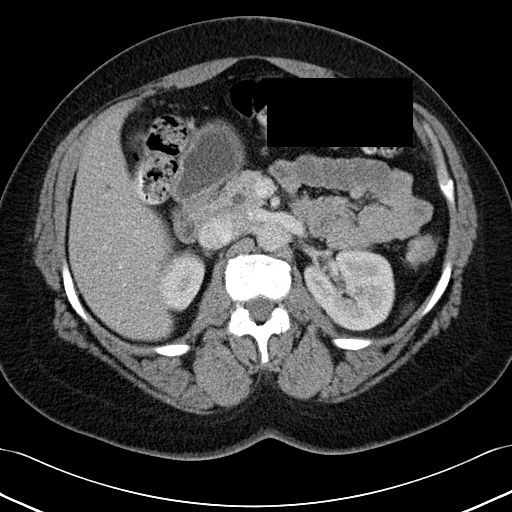
[im 58/116  lung]
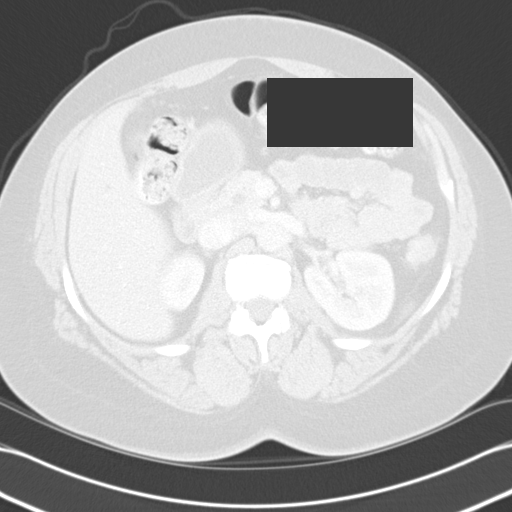
[im 70/116  lung]
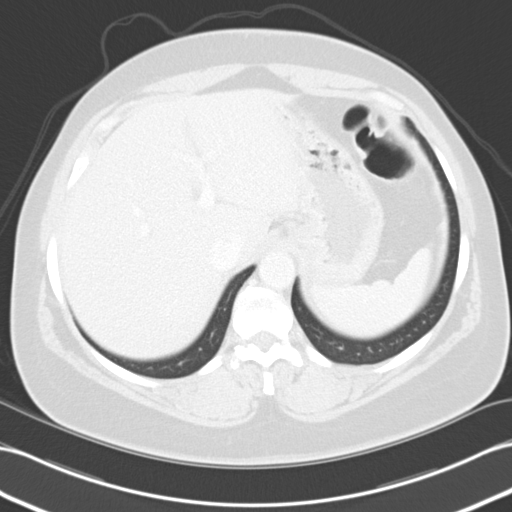
[im 81/116  lung]
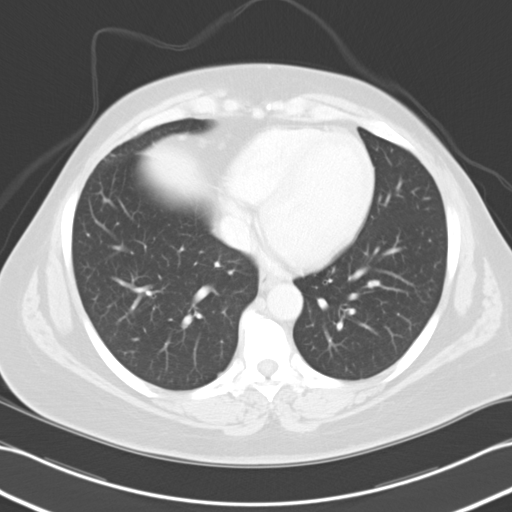
[im 93/116  lung]
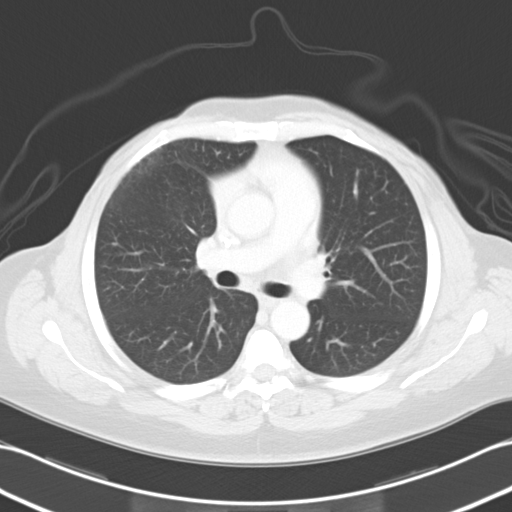
[im 104/116  mediastinal]
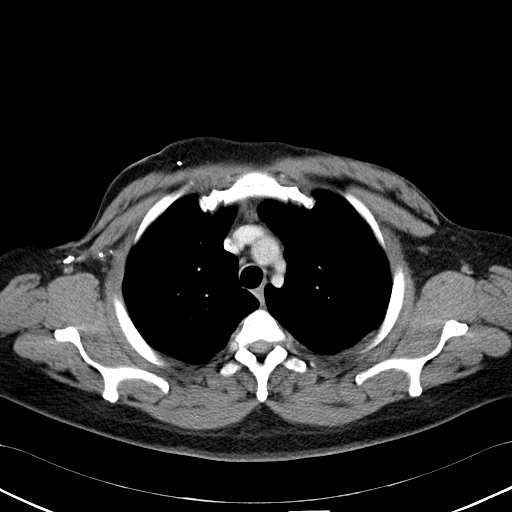
[im 104/116  lung]
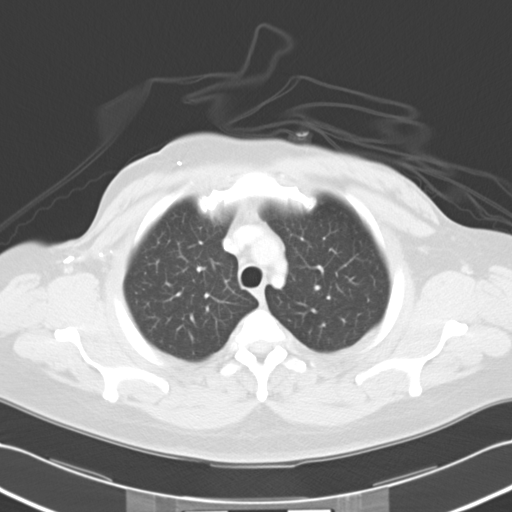

[Series 7: cap 5.0 b70f · axial · 0.68mm/px · z∈[+924,+1054]mm · 3 of 52 slices shown]
[im 13/52  lung]
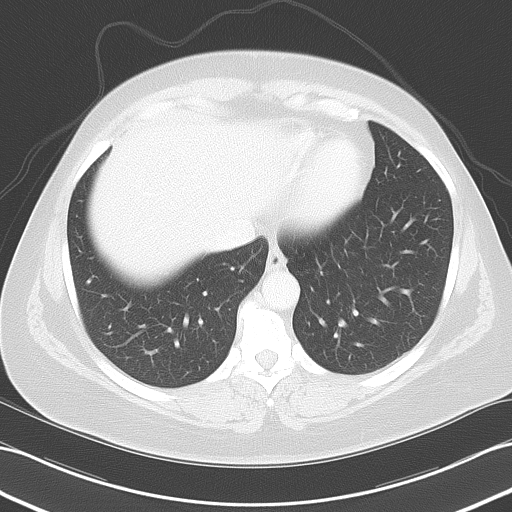
[im 26/52  lung]
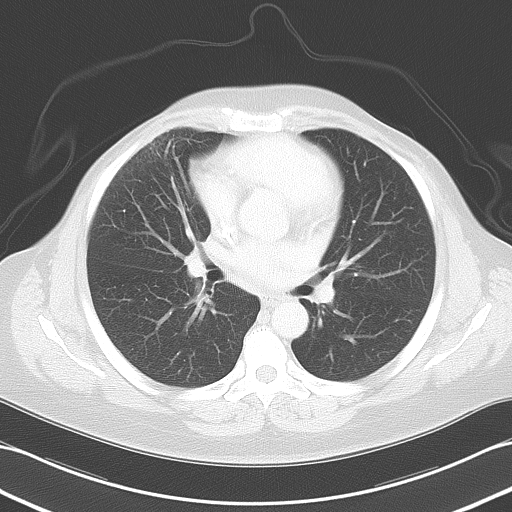
[im 39/52  lung]
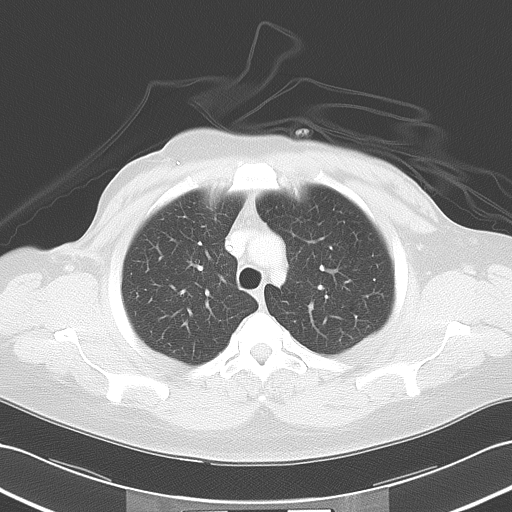

[Series 602: <mpr thick range> · coronal · 1.13mm/px · 3 of 86 slices shown]
[im 18/86  lung]
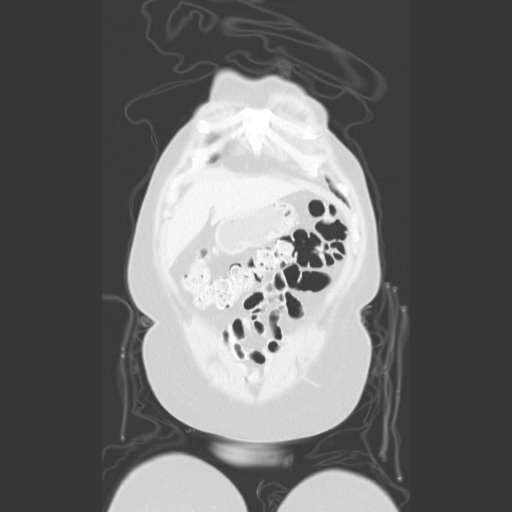
[im 35/86  lung]
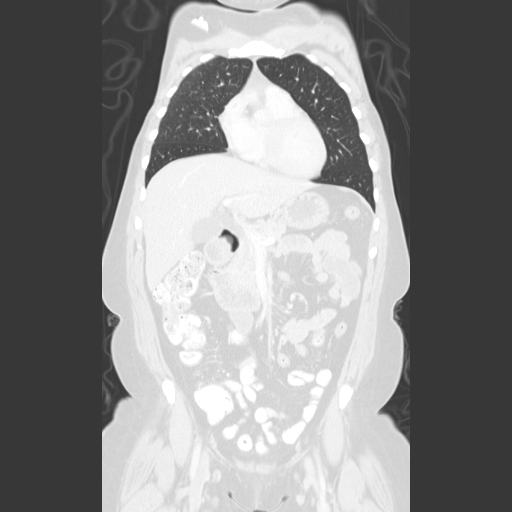
[im 52/86  lung]
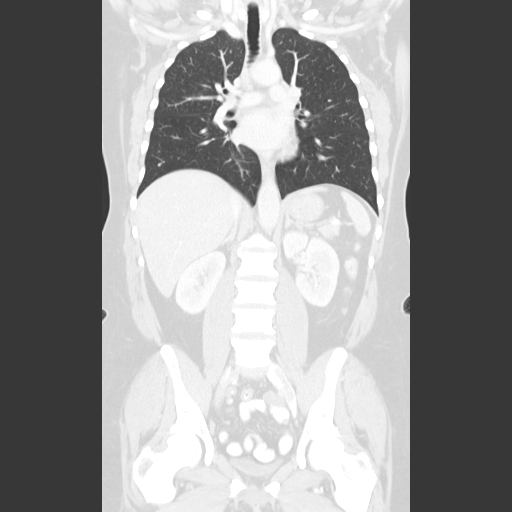

[15 of 36 positions shown; findings below may reference images not displayed]

FINDINGS: Small 2 mm enhancing focus on a single slice in the
inferior left frontal lobe on image 11 is indeterminate.  No
additional enhancing lesions within the brain parenchyma, meningeal
surface or ventricles.

Ventricles are normal volume.  No midline shift or mass effect
basilar cisterns are patent.  Paranasal sinuses and mastoid air
cells are clear.  No evidence of calvarial lesions.
IMPRESSION: 1..  Small hyperdensity seen only on the enhanced images measuring
2 mm in the inferior left frontal lobe.  This lesion may present
volume averaging however cannot exclude a small enhancing
metastasis.  Recommend MRI brain with contrast..

CT CHEST
FINDINGS: Bilateral mastectomies.  Port in  right anterior chest
wall.  Ill-defined density in the anterior mediastinum conforms to
the triangular shape and likely residual thymus.  No mediastinal,
hilar, axillary, or supraclavicular adenopathy.  Prior right
axillary dissection.  Review of lung windows demonstrates no new or
suspicious lesions.  A peripheral reticular pattern in the right
middle lobe suggest radiation fibrosis.
IMPRESSION: No evidence of metastatic breast cancer

CT ABDOMEN
FINDINGS: Small lymph nodes in the precordial region anterior to
the medial left hepatic lobe are not changed.  No focal hepatic
lesions as which are concerning.  Small hypodensity in the medial
right hepatic lobe (image 5) measures 5 mm unchanged from prior.
The common bile duct is again and dilated to 9 mm.  No significant
pancreatic ductal dilatation.  No pancreatic mass.

The spleen, adrenal glands, and kidneys are normal.  Small bowel is
normal.  There are scattered small sub centimeter mesenteric lymph
nodes in the ileal cecal territory as well as a central mesentery
which are slightly more prominent prior for example image 85.  No
pathologically enlarged lymph nodes.  Abdominal aorta is normal.
IMPRESSION: 1.  No evidence of breast cancer metastasis within the abdomen.
2.  Mild increased prominence of ileocecal lymph nodes is
nonspecific finding.
3.  Small hepatic hypodensity measuring 5 mm is unchanged.

CT PELVIS
FINDINGS: No free fluid in the pelvis.  The bladder and uterus are
normal.  The rectum and sigmoid colon are normal.  Small bilateral
iliac lymph nodes are not pathologic by CT size criteria.
Bilateral inguinal lymph nodes measure up to 13 mm which also is
within normal limits.  Review bone windows demonstrates no
aggressive osseous lesions.
IMPRESSION: 1.  No evidence of metastasis within the pelvis.

## 2008-07-11 IMAGING — MR MR HEAD WO/W CM
8 of 11 series · 29 of 48 positions shown · IV contrast (multihance)
Comparison: Multiple previous studies, most recent CT head
02/20/2008

CLINICAL DATA: History of breast CA, evaluate for metastatic
disease

MRI HEAD WITHOUT AND WITH CONTRAST
TECHNIQUE: Multiplanar, multiecho pulse sequences of the brain and
surrounding structures were obtained according to standard protocol
without and with intravenous contrast
Contrast: MultiHance 18 ml

[Series 2: T1 · sagittal · 5.0mm · 0.43mm/px · 3 of 22 slices shown]
[im 1/22]
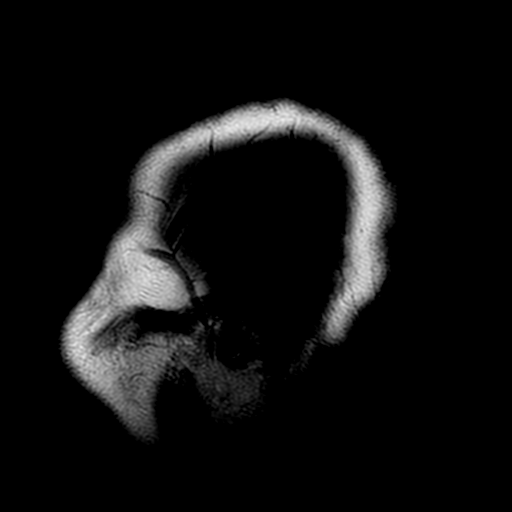
[im 11/22]
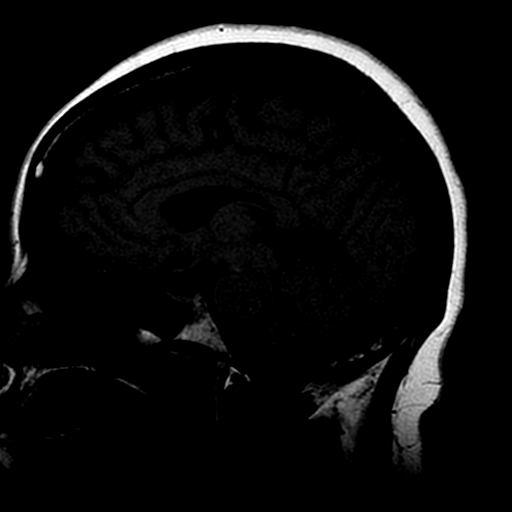
[im 22/22]
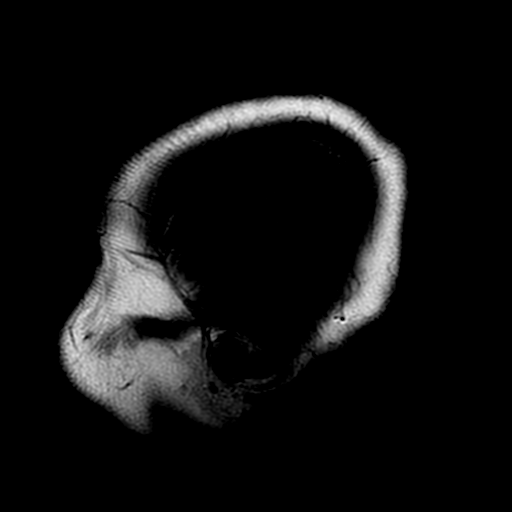

[Series 3: DWI · axial · 5.0mm · 1.41mm/px · z∈[-68,+74]mm · 7 of 54 slices shown (1 of 2)]
[im 1/54]
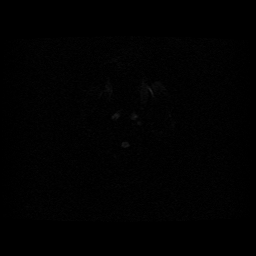
[im 9/54]
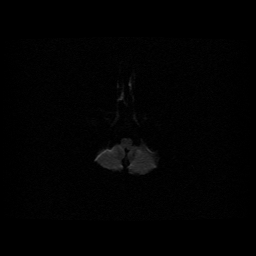
[im 18/54]
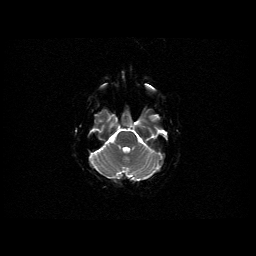
[im 27/54]
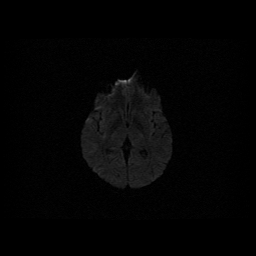
[im 36/54]
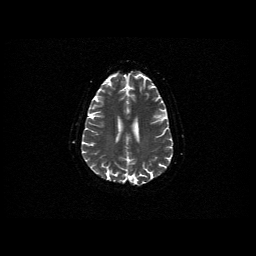
[im 45/54]
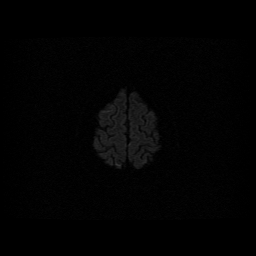
[im 54/54]
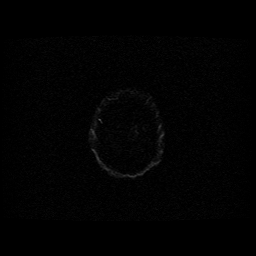

[Series 4: T2 · axial · 5.0mm · 0.43mm/px · z∈[-75,+71]mm · 3 of 22 slices shown (1 of 2)]
[im 1/22]
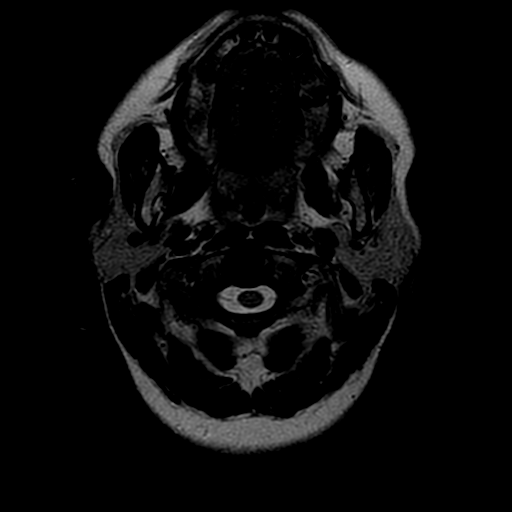
[im 11/22]
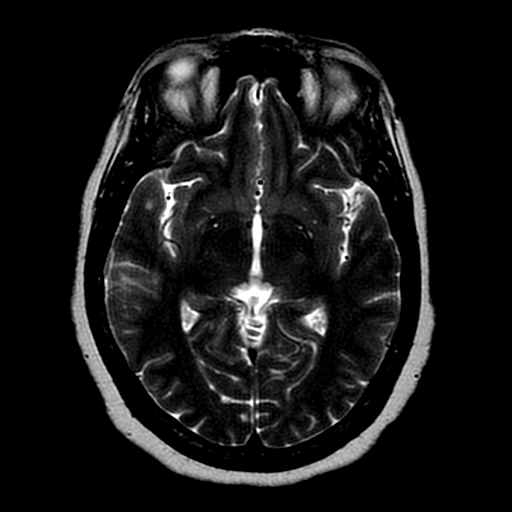
[im 22/22]
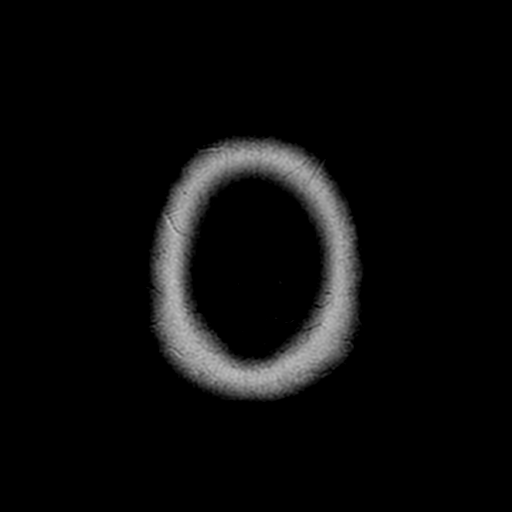

[Series 5: FLAIR · axial · 5.0mm · 0.43mm/px · z∈[-75,+71]mm · 3 of 22 slices shown]
[im 1/22]
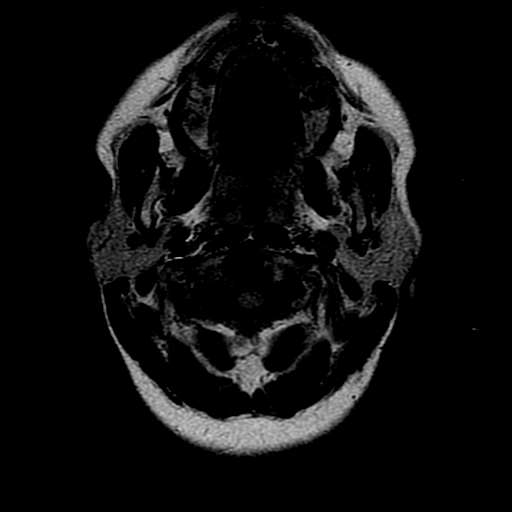
[im 11/22]
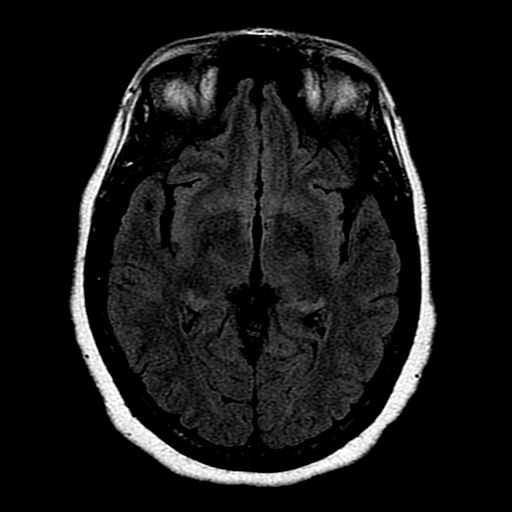
[im 22/22]
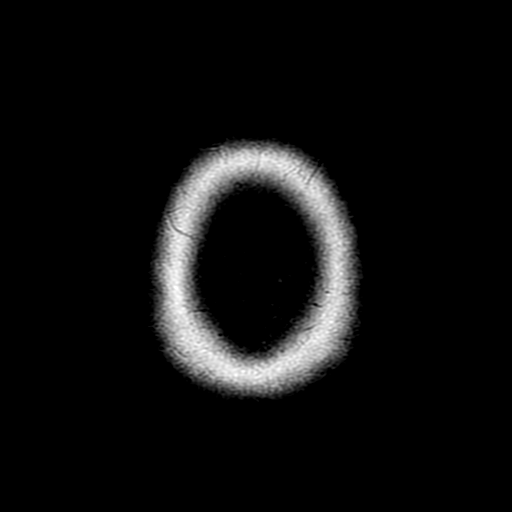

[Series 6: T2-star · axial · 5.0mm · 0.86mm/px · z∈[-75,+71]mm · 3 of 22 slices shown]
[im 1/22]
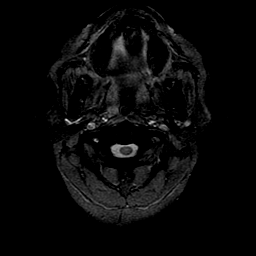
[im 11/22]
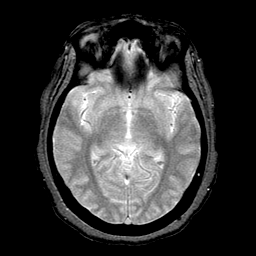
[im 22/22]
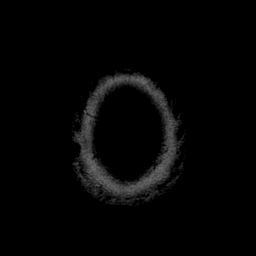

[Series 8: T2 · coronal · 5.0mm · 0.43mm/px · 3 of 25 slices shown (2 of 2)]
[im 1/25]
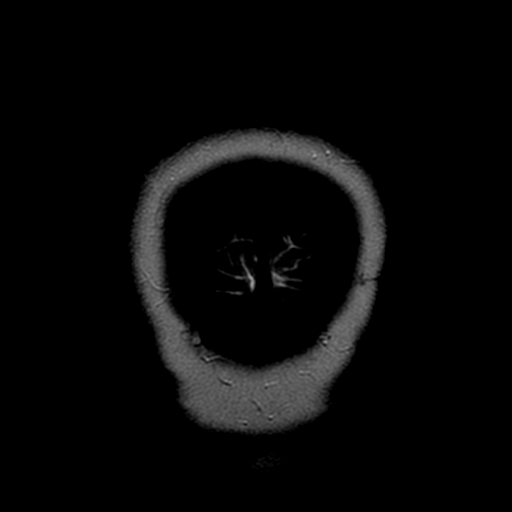
[im 13/25]
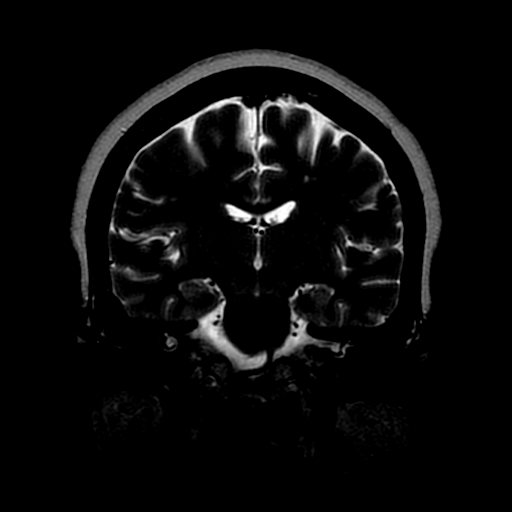
[im 25/25]
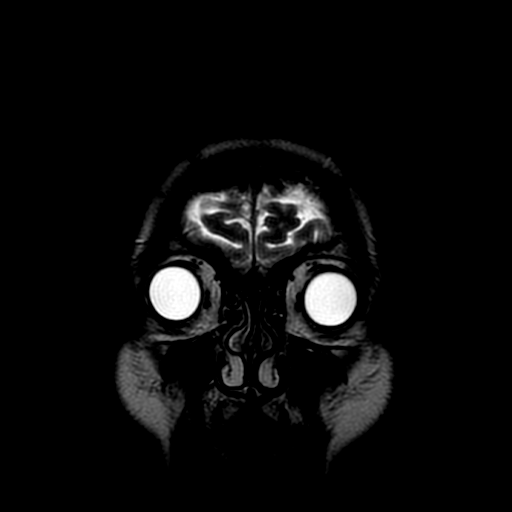

[Series 11: T1 post-contrast · sagittal · 5.0mm · 0.43mm/px · 3 of 22 slices shown]
[im 1/22]
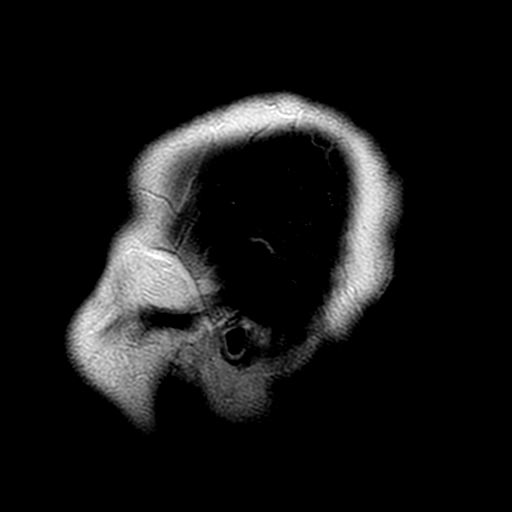
[im 11/22]
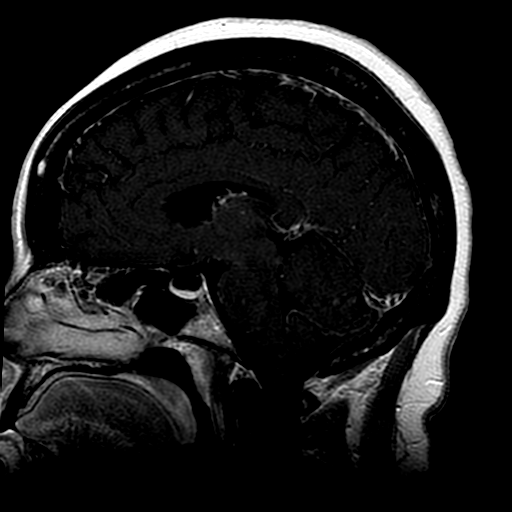
[im 22/22]
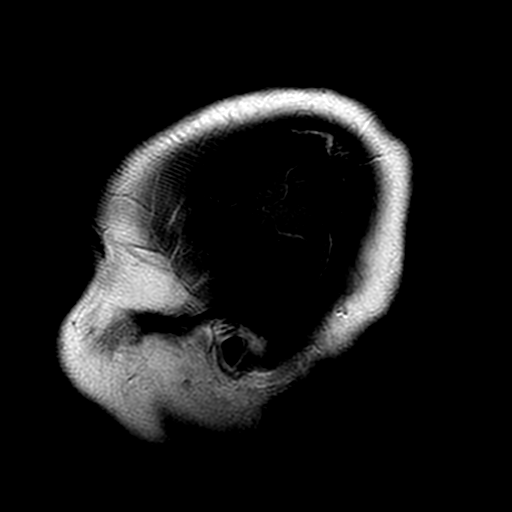

[Series 997: DWI · axial · 5.0mm · 1.41mm/px · z∈[-68,+74]mm · 4 of 27 slices shown (2 of 2)]
[im 1/27]
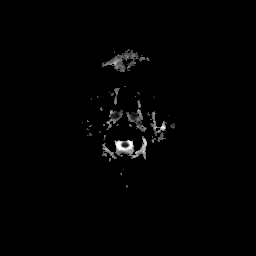
[im 9/27]
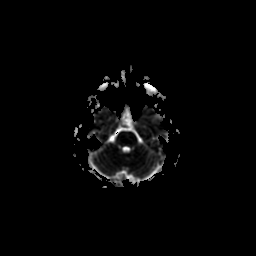
[im 18/27]
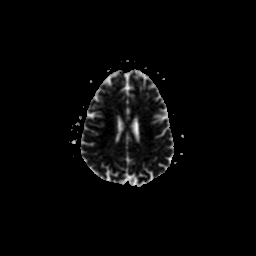
[im 27/27]
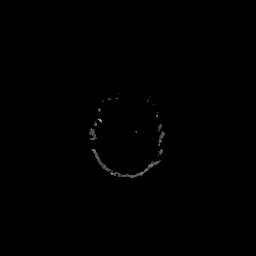

[29 of 48 positions shown; findings below may reference images not displayed]

FINDINGS: There is no evidence for acute infarction, intracranial
hemorrhage, mass lesion, hydrocephalus, or extra-axial fluid.
There is minimal white matter disease, and no brain edema, or
congenital anomaly.  Post infusion, there is no abnormal
enhancement of the brain or meninges.

The area questioned on prior CT is likely a prominent intracerebral
vessel.  I see no abnormal signal, or post contrast enhancement
involving the inferior left frontal lobe which might correlate with
the observed CT findings.

There is slight flattening of the pituitary gland and perhaps
slight enlargement of the sella turcica by increased CSF dorsally.
This appearance is consistent with a so-called "empty sella".  This
appears to be unchanged from priors.  Clinical significance is
doubtful.

Compared to the prior MR from 04/20/2007, there is little change in
the mild small vessel disease noted previously.  No metastases were
observed on the prior study as well.
IMPRESSION: No evidence for intracranial metastatic disease.

Mild chronic small vessel disease, unchanged.

Mild empty sella, question significance.

## 2008-07-22 ENCOUNTER — Ambulatory Visit: Payer: Self-pay | Admitting: Oncology

## 2008-07-24 LAB — CBC WITH DIFFERENTIAL/PLATELET
BASO%: 1.5 % (ref 0.0–2.0)
Basophils Absolute: 0.1 10*3/uL (ref 0.0–0.1)
EOS%: 3.1 % (ref 0.0–7.0)
Eosinophils Absolute: 0.2 10*3/uL (ref 0.0–0.5)
HCT: 38.2 % (ref 34.8–46.6)
HGB: 12.9 g/dL (ref 11.6–15.9)
LYMPH%: 32.2 % (ref 14.0–48.0)
MCH: 29.5 pg (ref 26.0–34.0)
MCHC: 33.7 g/dL (ref 32.0–36.0)
MCV: 87.8 fL (ref 81.0–101.0)
MONO#: 0.4 10*3/uL (ref 0.1–0.9)
MONO%: 7.6 % (ref 0.0–13.0)
NEUT#: 2.7 10*3/uL (ref 1.5–6.5)
NEUT%: 55.6 % (ref 39.6–76.8)
Platelets: 239 10*3/uL (ref 145–400)
RBC: 4.36 10*6/uL (ref 3.70–5.32)
RDW: 13.9 % (ref 11.3–14.5)
WBC: 4.9 10*3/uL (ref 3.9–10.0)
lymph#: 1.6 10*3/uL (ref 0.9–3.3)

## 2008-07-25 LAB — COMPREHENSIVE METABOLIC PANEL
ALT: 16 U/L (ref 0–35)
AST: 19 U/L (ref 0–37)
Albumin: 4.3 g/dL (ref 3.5–5.2)
Alkaline Phosphatase: 82 U/L (ref 39–117)
BUN: 12 mg/dL (ref 6–23)
CO2: 24 mEq/L (ref 19–32)
Calcium: 9.6 mg/dL (ref 8.4–10.5)
Chloride: 108 mEq/L (ref 96–112)
Creatinine, Ser: 0.86 mg/dL (ref 0.40–1.20)
Glucose, Bld: 106 mg/dL — ABNORMAL HIGH (ref 70–99)
Potassium: 4 mEq/L (ref 3.5–5.3)
Sodium: 140 mEq/L (ref 135–145)
Total Bilirubin: 0.3 mg/dL (ref 0.3–1.2)
Total Protein: 7.1 g/dL (ref 6.0–8.3)

## 2008-07-25 LAB — VITAMIN D 25 HYDROXY (VIT D DEFICIENCY, FRACTURES): Vit D, 25-Hydroxy: 30 ng/mL (ref 30–89)

## 2008-07-25 LAB — LACTATE DEHYDROGENASE: LDH: 141 U/L (ref 94–250)

## 2008-07-25 LAB — CANCER ANTIGEN 27.29: CA 27.29: 28 U/mL (ref 0–39)

## 2008-08-22 ENCOUNTER — Ambulatory Visit (HOSPITAL_COMMUNITY): Admission: RE | Admit: 2008-08-22 | Discharge: 2008-08-22 | Payer: Self-pay | Admitting: Oncology

## 2008-09-18 ENCOUNTER — Ambulatory Visit: Payer: Self-pay | Admitting: Oncology

## 2008-09-22 LAB — COMPREHENSIVE METABOLIC PANEL
ALT: 11 U/L (ref 0–35)
AST: 20 U/L (ref 0–37)
Albumin: 4.3 g/dL (ref 3.5–5.2)
Alkaline Phosphatase: 87 U/L (ref 39–117)
BUN: 15 mg/dL (ref 6–23)
CO2: 26 mEq/L (ref 19–32)
Calcium: 9.9 mg/dL (ref 8.4–10.5)
Chloride: 105 mEq/L (ref 96–112)
Creatinine, Ser: 0.82 mg/dL (ref 0.40–1.20)
Glucose, Bld: 96 mg/dL (ref 70–99)
Potassium: 4.2 mEq/L (ref 3.5–5.3)
Sodium: 139 mEq/L (ref 135–145)
Total Bilirubin: 0.3 mg/dL (ref 0.3–1.2)
Total Protein: 7 g/dL (ref 6.0–8.3)

## 2008-09-22 LAB — CBC WITH DIFFERENTIAL/PLATELET
BASO%: 1.4 % (ref 0.0–2.0)
Basophils Absolute: 0.1 10*3/uL (ref 0.0–0.1)
EOS%: 3.4 % (ref 0.0–7.0)
Eosinophils Absolute: 0.2 10*3/uL (ref 0.0–0.5)
HCT: 40.8 % (ref 34.8–46.6)
HGB: 13.6 g/dL (ref 11.6–15.9)
LYMPH%: 33 % (ref 14.0–48.0)
MCH: 28 pg (ref 26.0–34.0)
MCHC: 33.3 g/dL (ref 32.0–36.0)
MCV: 84.1 fL (ref 81.0–101.0)
MONO#: 0.6 10*3/uL (ref 0.1–0.9)
MONO%: 9.5 % (ref 0.0–13.0)
NEUT#: 3.1 10*3/uL (ref 1.5–6.5)
NEUT%: 52.7 % (ref 39.6–76.8)
Platelets: 273 10*3/uL (ref 145–400)
RBC: 4.85 10*6/uL (ref 3.70–5.32)
RDW: 12.6 % (ref 11.3–14.5)
WBC: 5.8 10*3/uL (ref 3.9–10.0)
lymph#: 1.9 10*3/uL (ref 0.9–3.3)

## 2008-09-22 LAB — LACTATE DEHYDROGENASE: LDH: 153 U/L (ref 94–250)

## 2008-09-22 LAB — CANCER ANTIGEN 27.29: CA 27.29: 35 U/mL (ref 0–39)

## 2008-09-25 ENCOUNTER — Ambulatory Visit (HOSPITAL_COMMUNITY): Admission: RE | Admit: 2008-09-25 | Discharge: 2008-09-25 | Payer: Self-pay | Admitting: Oncology

## 2008-09-25 ENCOUNTER — Encounter: Payer: Self-pay | Admitting: Oncology

## 2008-09-29 LAB — CBC WITH DIFFERENTIAL/PLATELET
BASO%: 1.2 % (ref 0.0–2.0)
Basophils Absolute: 0.1 10*3/uL (ref 0.0–0.1)
EOS%: 3.6 % (ref 0.0–7.0)
Eosinophils Absolute: 0.2 10*3/uL (ref 0.0–0.5)
HCT: 44 % (ref 34.8–46.6)
HGB: 14.6 g/dL (ref 11.6–15.9)
LYMPH%: 31 % (ref 14.0–48.0)
MCH: 28 pg (ref 26.0–34.0)
MCHC: 33.2 g/dL (ref 32.0–36.0)
MCV: 84.4 fL (ref 81.0–101.0)
MONO#: 0.4 10*3/uL (ref 0.1–0.9)
MONO%: 6.4 % (ref 0.0–13.0)
NEUT#: 4 10*3/uL (ref 1.5–6.5)
NEUT%: 57.8 % (ref 39.6–76.8)
Platelets: 300 10*3/uL (ref 145–400)
RBC: 5.21 10*6/uL (ref 3.70–5.32)
RDW: 13 % (ref 11.3–14.5)
WBC: 6.8 10*3/uL (ref 3.9–10.0)
lymph#: 2.1 10*3/uL (ref 0.9–3.3)

## 2008-09-29 LAB — COMPREHENSIVE METABOLIC PANEL
ALT: 10 U/L (ref 0–35)
AST: 15 U/L (ref 0–37)
Albumin: 3.4 g/dL — ABNORMAL LOW (ref 3.5–5.2)
Alkaline Phosphatase: 67 U/L (ref 39–117)
BUN: 10 mg/dL (ref 6–23)
CO2: 19 mEq/L (ref 19–32)
Calcium: 7.4 mg/dL — ABNORMAL LOW (ref 8.4–10.5)
Chloride: 114 mEq/L — ABNORMAL HIGH (ref 96–112)
Creatinine, Ser: 0.64 mg/dL (ref 0.40–1.20)
Glucose, Bld: 97 mg/dL (ref 70–99)
Potassium: 3 mEq/L — ABNORMAL LOW (ref 3.5–5.3)
Sodium: 145 mEq/L (ref 135–145)
Total Bilirubin: 0.3 mg/dL (ref 0.3–1.2)
Total Protein: 5.6 g/dL — ABNORMAL LOW (ref 6.0–8.3)

## 2008-09-29 LAB — LACTATE DEHYDROGENASE: LDH: 127 U/L (ref 94–250)

## 2008-10-05 IMAGING — CT CT CHEST W/ CM
2 of 5 series · 16 of 46 positions shown, 18 images · IV contrast (agent unspecified)
Comparison: Prior CT examinations 02/20/2008 and PET CT done today.

CT CHEST

CLINICAL DATA: Bilateral breast cancer (right diagnosed 8224, left
diagnosed [id number]).  Chemotherapy in progress.

CT CHEST, ABDOMEN AND PELVIS WITH CONTRAST
TECHNIQUE: Multidetector CT imaging of the chest, abdomen and
pelvis was performed following the standard protocol during bolus
administration of intravenous contrast.
Contrast: 100 ml 0mnipaque-I44 intravenously.  Oral contrast was
given.  The patient was premedicated with 50 mg of Benadryl 1 hour
prior to the procedure due to history of hives.  There were no
immediate complications.

[Series 2: cap 5.0 b40f · axial · 0.72mm/px · z∈[-582,-72]mm · 13 of 118 slices shown, 15 images]
[im 8/118  soft-tissue]
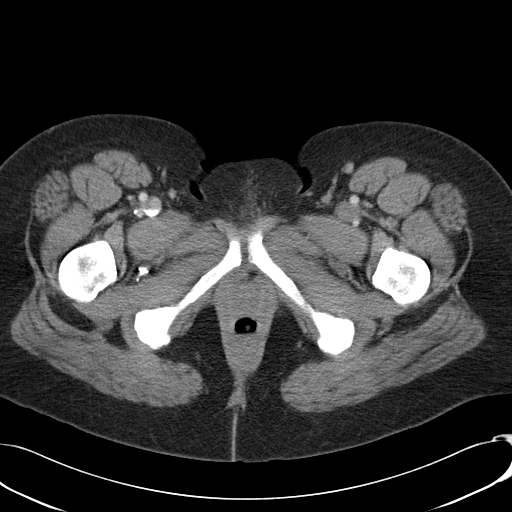
[im 8/118  bone]
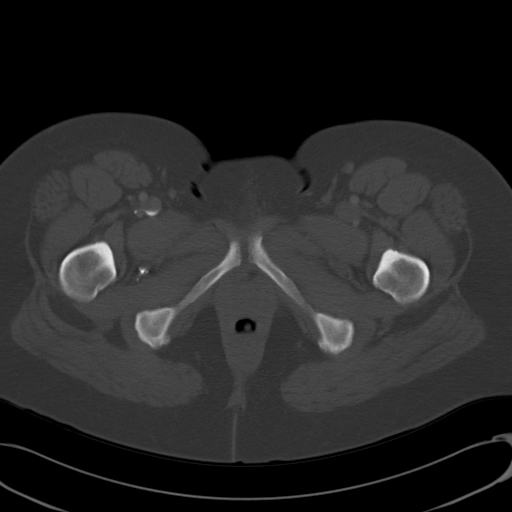
[im 15/118  soft-tissue]
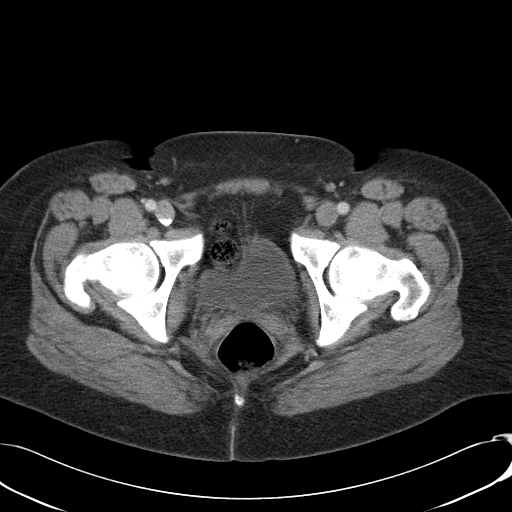
[im 22/118  soft-tissue]
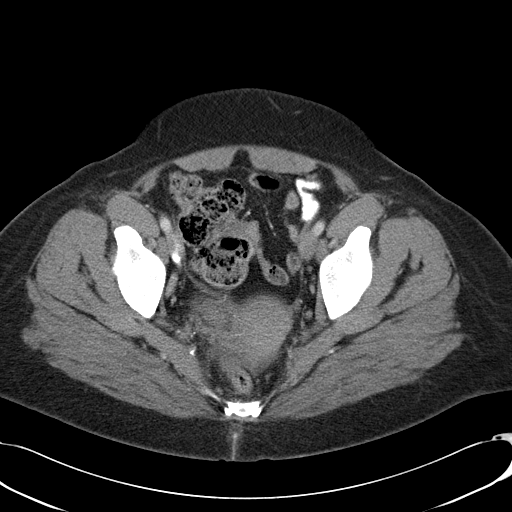
[im 37/118  soft-tissue]
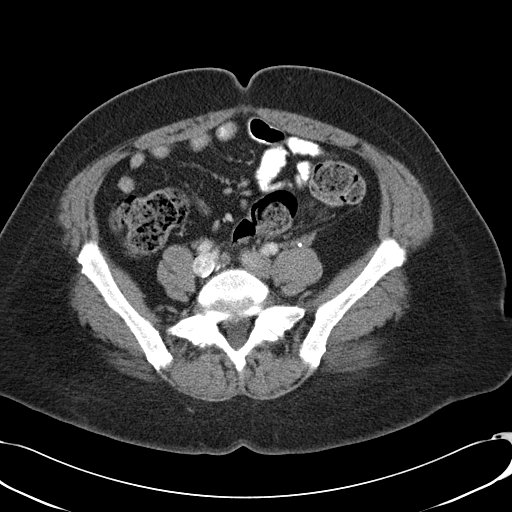
[im 44/118  soft-tissue]
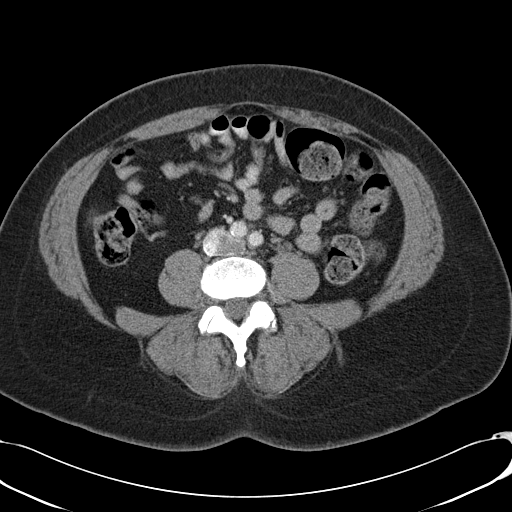
[im 52/118  soft-tissue]
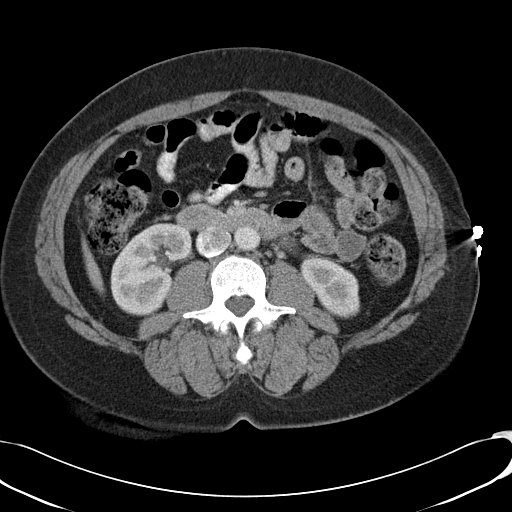
[im 59/118  soft-tissue]
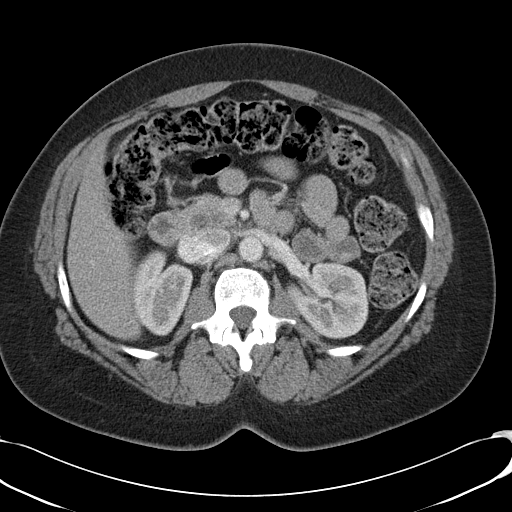
[im 66/118  soft-tissue]
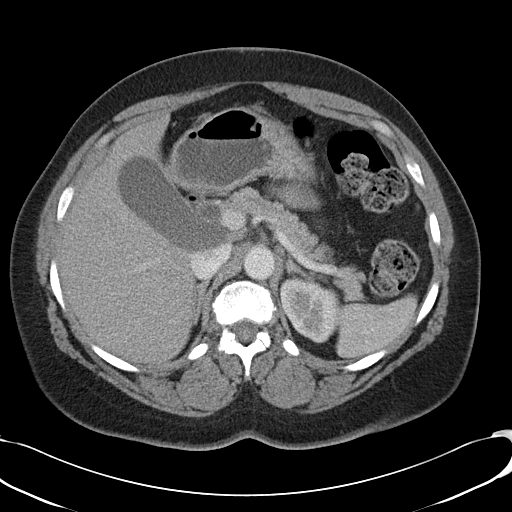
[im 74/118  soft-tissue]
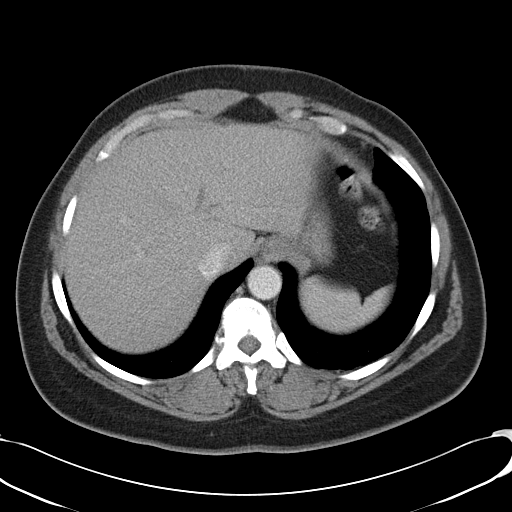
[im 74/118  bone]
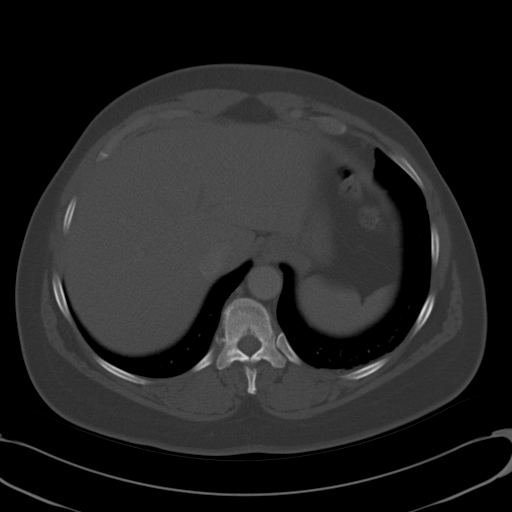
[im 81/118  soft-tissue]
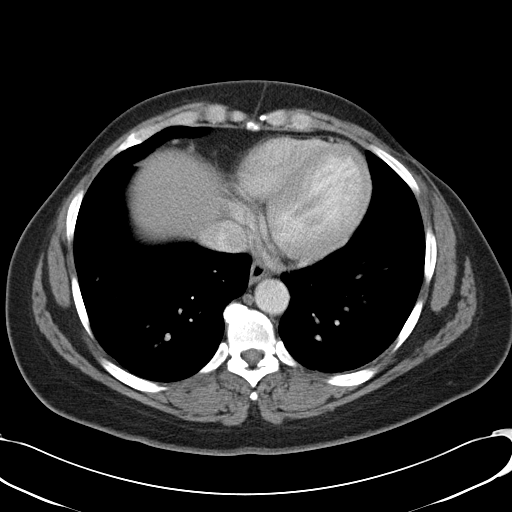
[im 96/118  soft-tissue]
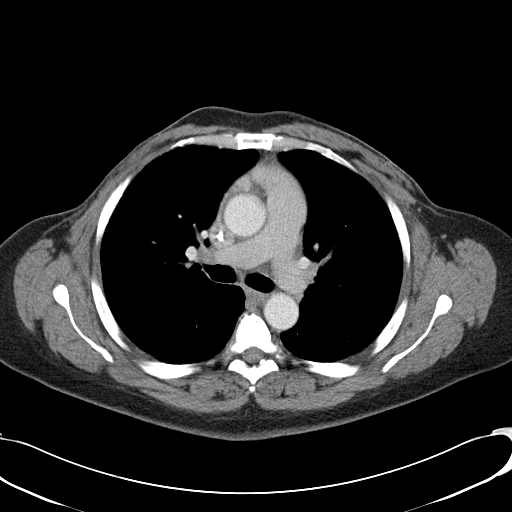
[im 103/118  soft-tissue]
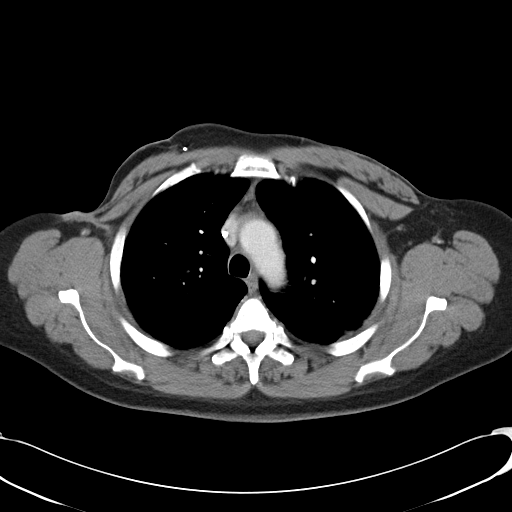
[im 110/118  soft-tissue]
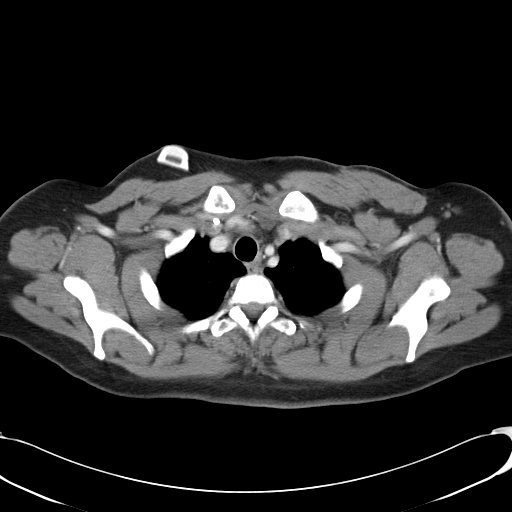

[Series 602: <mpr thick range> · coronal · 1.15mm/px · 3 of 91 slices shown]
[im 31/91  soft-tissue]
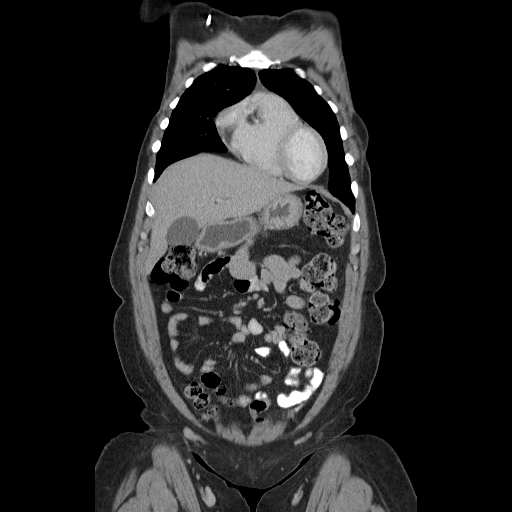
[im 41/91  soft-tissue]
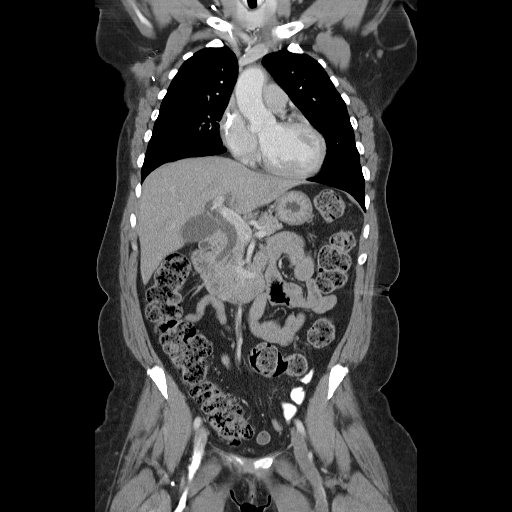
[im 51/91  soft-tissue]
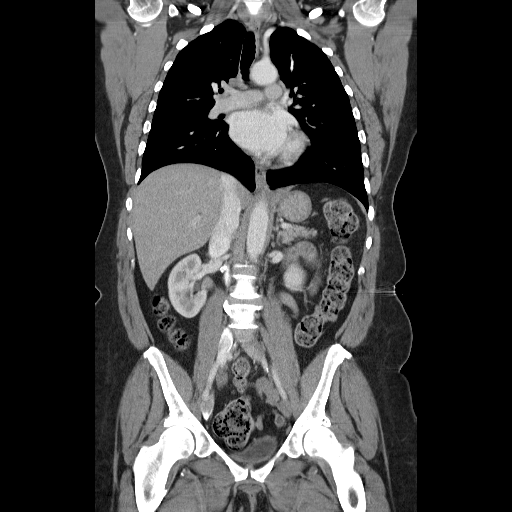

[16 of 46 positions shown; findings below may reference images not displayed]

FINDINGS: Right IJ Port-A-Cath extends to the SVC right atrial
junction.  Postsurgical changes status post bilateral mastectomy
and right axillary node dissection appear stable.  Small bilateral
axillary lymph nodes appear stable.  There are no enlarged
axillary, internal mammary, mediastinal or hilar lymph nodes.
Residual thymic tissue appears unchanged.

There is no pleural or pericardial effusion.  Postradiation changes
anteriorly in the right upper and middle lobes appear stable.  No
suspicious pulmonary findings or osseous lesions are demonstrated.
IMPRESSION: Stable postoperative CT of the chest.  No evidence of local
recurrence of breast cancer or metastatic disease.

CT ABDOMEN
FINDINGS: A previously demonstrated 5 mm low density liver lesion
inferiorly in the right lobe is not as well seen on the current
examination and may be smaller.  There are no new or enlarging
liver lesions.  Biliary dilatation appears stable.  There is no
evidence of pancreatic mass or pancreatic ductal dilatation.  The
gallbladder, spleen, adrenal glands and kidneys appear normal.

No enlarged lymph nodes are identified.  There are no suspicious
osseous lesions.  The bowel gas pattern is normal.
IMPRESSION: 1.  No evidence of metastatic breast cancer in the abdomen.
2.  Stable biliary dilatation.  This could be physiologic based on
stability - correlation with liver function tests is recommended.

CT PELVIS
FINDINGS: There is no pelvic mass, fluid collection or inflammatory
process.  The uterus and ovaries appear stable.  Prominent lymph
nodes in the ileocolonic mesentery noted on the prior examination
have decreased in size.  External iliac and inguinal lymph nodes
bilaterally appear stable.  No suspicious osseous lesions.
IMPRESSION: 1.  No evidence of pelvic metastatic disease.
2.  Generally stable pelvic lymph nodes with interval decrease in
size and number of ileocolonic nodes.

## 2008-10-05 IMAGING — PT NM PET TUM IMG SKULL BASE T - THIGH
6 series · 25 of 25 positions shown · non-contrast
Comparison: PET CT 02/20/2008.  Diagnostic CTs of the chest,
abdomen and pelvis done today are also correlated.

CLINICAL DATA: History of breast cancer status post bilateral
mastectomy and chemotherapy.

NUCLEAR MEDICINE FDG PET CT TUMOR IMAGING- (SKULL BASE THROUGH
THIGHS)
TECHNIQUE: 16.5 mCi F-18 FDG was injected intravenously via the
right foot.  Full-ring PET imaging was performed from the skull
base through the mid-thighs 64  minutes after injection.  CT data
was obtained and used for attenuation correction and anatomic
localization only.  (This was not acquired as a diagnostic CT
examination.) Fasting Blood Glucose:  93

[Series 1: pet ac · axial · 3.3mm · 4.69mm/px · z∈[-862,+8]mm · 5 of 267 slices shown]
[im 1/267]
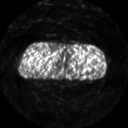
[im 67/267]
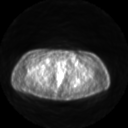
[im 134/267]
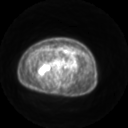
[im 200/267]
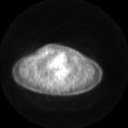
[im 267/267]
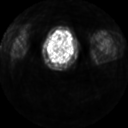

[Series 2: ct images · axial · 3.8mm · 0.98mm/px · z∈[-862,+8]mm · 5 of 264 slices shown]
[im 1/264]
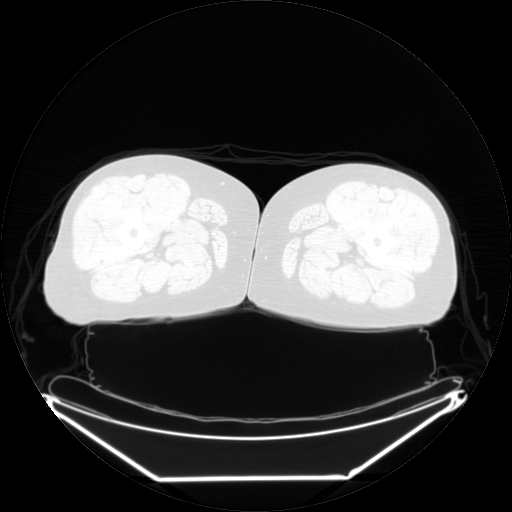
[im 66/264]
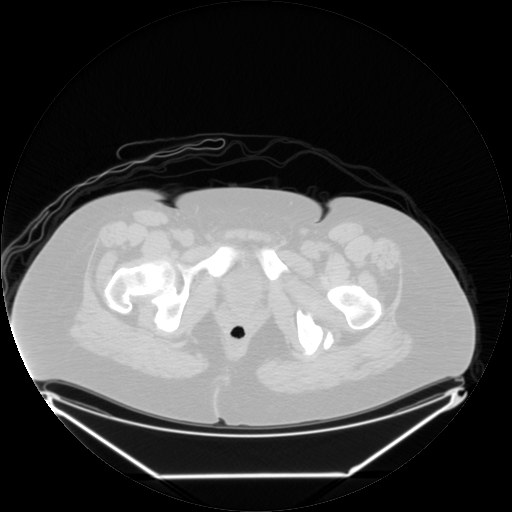
[im 132/264]
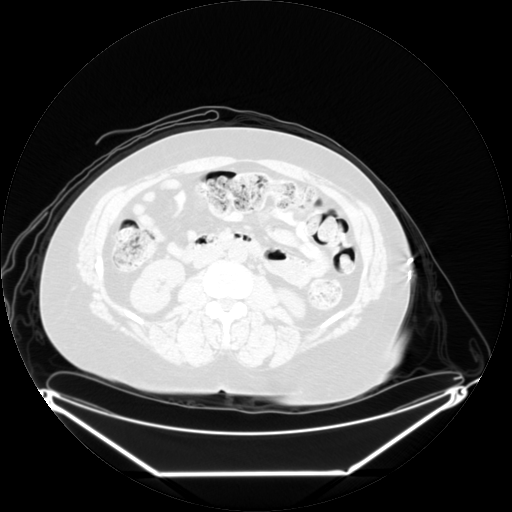
[im 198/264]
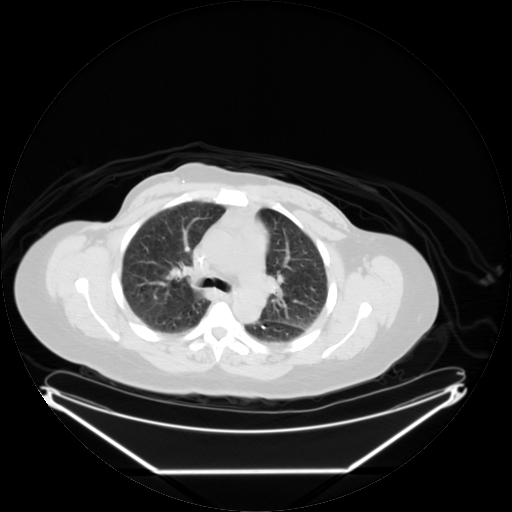
[im 264/264  brain]
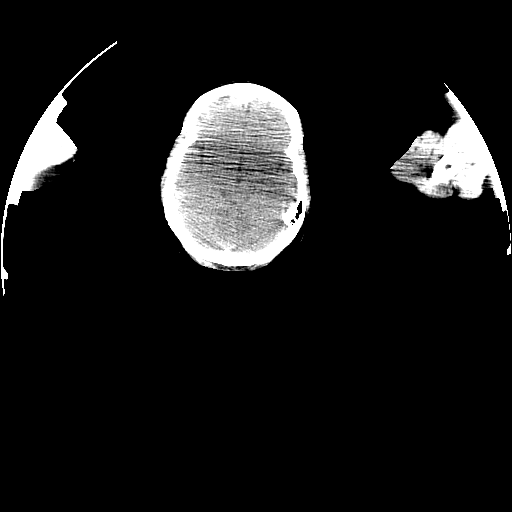

[Series 2: pet nac · axial · 3.3mm · 4.69mm/px · z∈[-862,+8]mm · 6 of 267 slices shown]
[im 1/267]
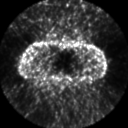
[im 54/267]
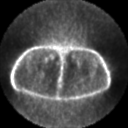
[im 107/267]
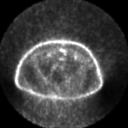
[im 160/267]
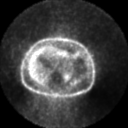
[im 213/267]
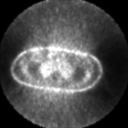
[im 267/267]
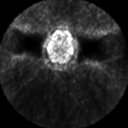

[Series 123: mip · coronal · 3.3mm · 4.69mm/px · 1 of 30 slices shown]
[im 1/30]
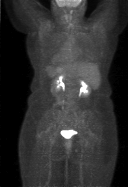

[Series 151: reformatted · axial · 3.3mm · 3.91mm/px · z∈[-862,+8]mm · 6 of 265 slices shown (1 of 2)]
[im 1/265]
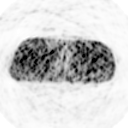
[im 53/265]
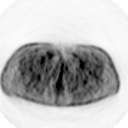
[im 106/265]
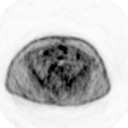
[im 159/265]
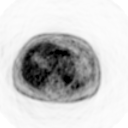
[im 212/265]
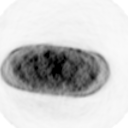
[im 265/265]
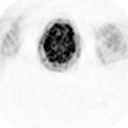

[Series 153: reformatted · coronal · 4.7mm · 6.98mm/px · 2 of 77 slices shown (2 of 2)]
[im 1/77]
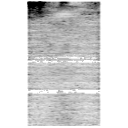
[im 77/77]
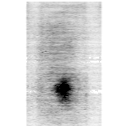

[25 of 25 positions shown; findings below may reference images not displayed]

FINDINGS: The current examination demonstrates symmetrically
increased metabolic activity within the lymphoid tissue of
Waldeyer's ring.  No focal lesion is demonstrated on the CT images.
There is no hypermetabolic nodal activity in the neck.

There is no hypermetabolic nodal activity in the chest, abdomen or
pelvis.  Minimal activity associated with the inguinal lymph nodes
appears stable.  There is no abnormal activity in the liver or
adrenal glands.  There is no suspicious osseous activity.
Prominent renal collecting system activity is present bilaterally,
unchanged.
IMPRESSION: 1.  No evidence of metastatic breast cancer.
2.  Nonspecific prominent lymphoid activity in Waldeyer's ring,
most commonly due to an upper respiratory infection.  No associated
adenopathy.  Correlate clinically.

## 2008-10-20 LAB — COMPREHENSIVE METABOLIC PANEL
ALT: 15 U/L (ref 0–35)
AST: 18 U/L (ref 0–37)
Albumin: 4 g/dL (ref 3.5–5.2)
Alkaline Phosphatase: 90 U/L (ref 39–117)
BUN: 9 mg/dL (ref 6–23)
CO2: 22 mEq/L (ref 19–32)
Calcium: 9 mg/dL (ref 8.4–10.5)
Chloride: 105 mEq/L (ref 96–112)
Creatinine, Ser: 0.78 mg/dL (ref 0.40–1.20)
Glucose, Bld: 116 mg/dL — ABNORMAL HIGH (ref 70–99)
Potassium: 3.7 mEq/L (ref 3.5–5.3)
Sodium: 141 mEq/L (ref 135–145)
Total Bilirubin: 0.2 mg/dL — ABNORMAL LOW (ref 0.3–1.2)
Total Protein: 6.5 g/dL (ref 6.0–8.3)

## 2008-10-20 LAB — CBC WITH DIFFERENTIAL/PLATELET
BASO%: 1.1 % (ref 0.0–2.0)
Basophils Absolute: 0.1 10*3/uL (ref 0.0–0.1)
EOS%: 4.2 % (ref 0.0–7.0)
Eosinophils Absolute: 0.3 10*3/uL (ref 0.0–0.5)
HCT: 37.1 % (ref 34.8–46.6)
HGB: 12.4 g/dL (ref 11.6–15.9)
LYMPH%: 30.7 % (ref 14.0–48.0)
MCH: 28.5 pg (ref 26.0–34.0)
MCHC: 33.5 g/dL (ref 32.0–36.0)
MCV: 85 fL (ref 81.0–101.0)
MONO#: 0.6 10*3/uL (ref 0.1–0.9)
MONO%: 9 % (ref 0.0–13.0)
NEUT#: 4 10*3/uL (ref 1.5–6.5)
NEUT%: 55 % (ref 39.6–76.8)
Platelets: 276 10*3/uL (ref 145–400)
RBC: 4.37 10*6/uL (ref 3.70–5.32)
RDW: 13.4 % (ref 11.3–14.5)
WBC: 7.2 10*3/uL (ref 3.9–10.0)
lymph#: 2.2 10*3/uL (ref 0.9–3.3)

## 2008-10-20 LAB — LACTATE DEHYDROGENASE: LDH: 148 U/L (ref 94–250)

## 2008-10-26 IMAGING — CR DG LUMBAR SPINE COMPLETE 4+V
5 series · 5 of 5 positions shown · non-contrast
Comparison: None

CLINICAL DATA: Low back pain, no known trauma, history of breast
carcinoma

LUMBAR SPINE - COMPLETE 4+ VIEW

[t l-spine a.p.]
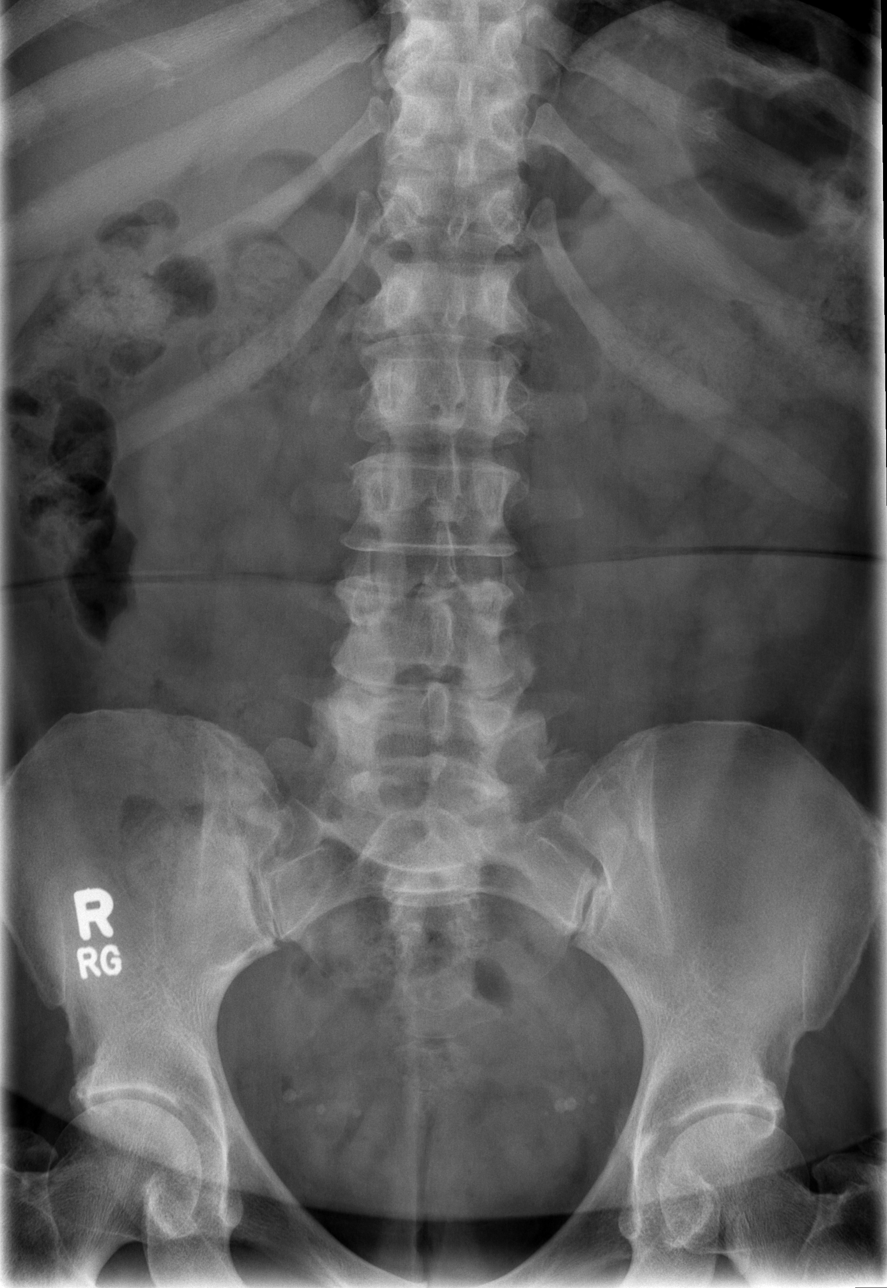

[t l-spine oblique exposure (1 of 2)]
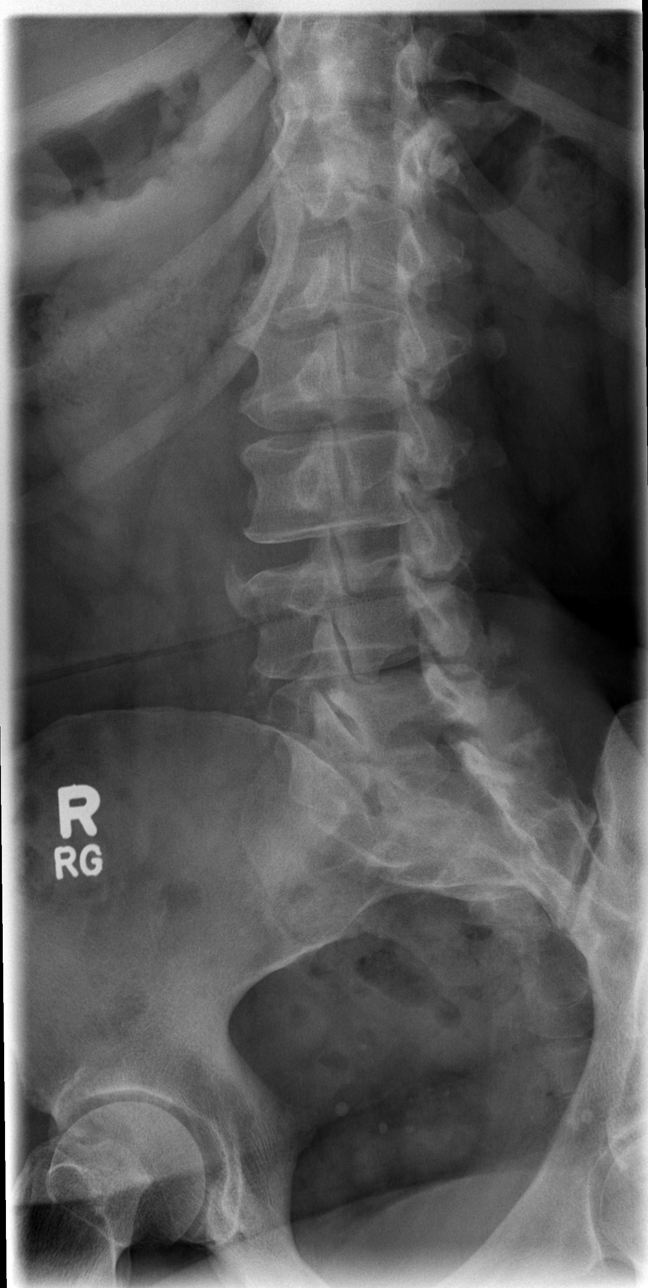

[t l-spine oblique exposure (2 of 2)]
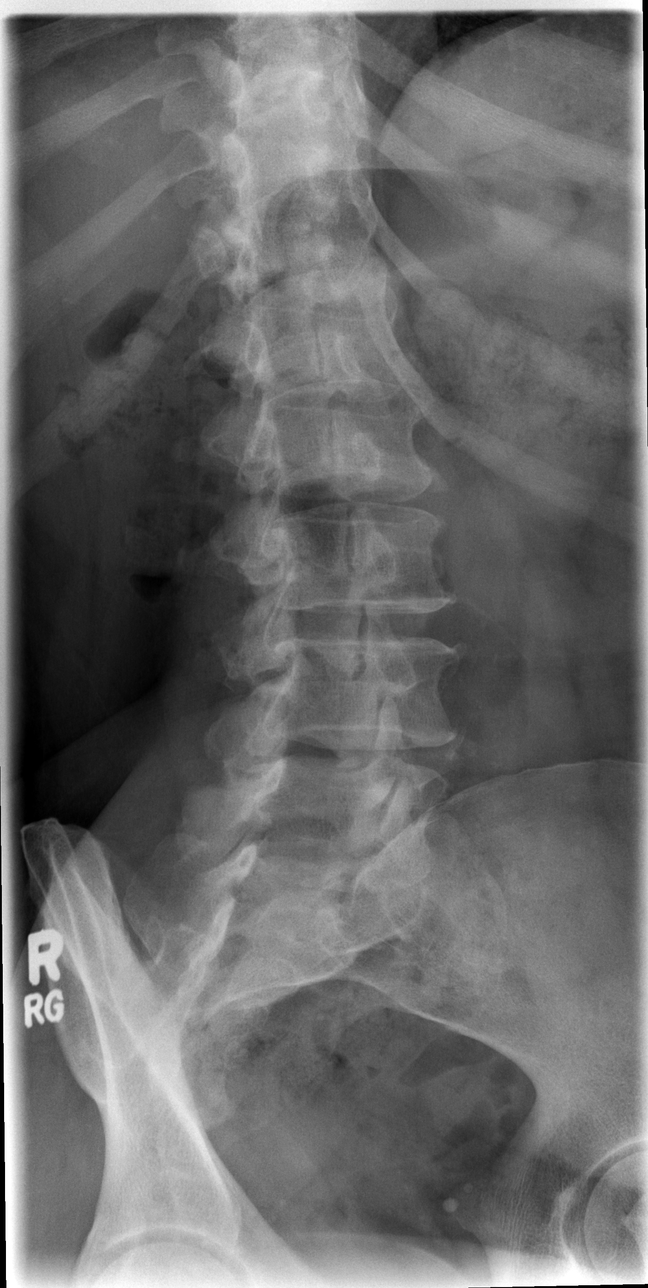

[t l-spine lat]
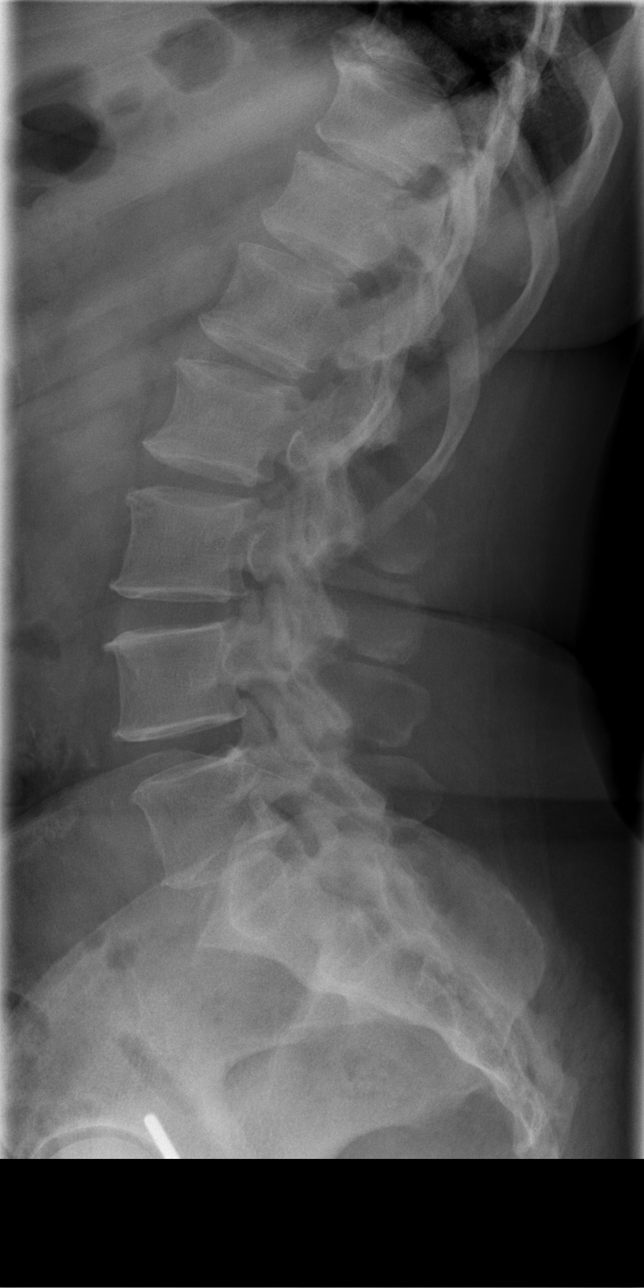

[t l-spine l5-s1 spot]
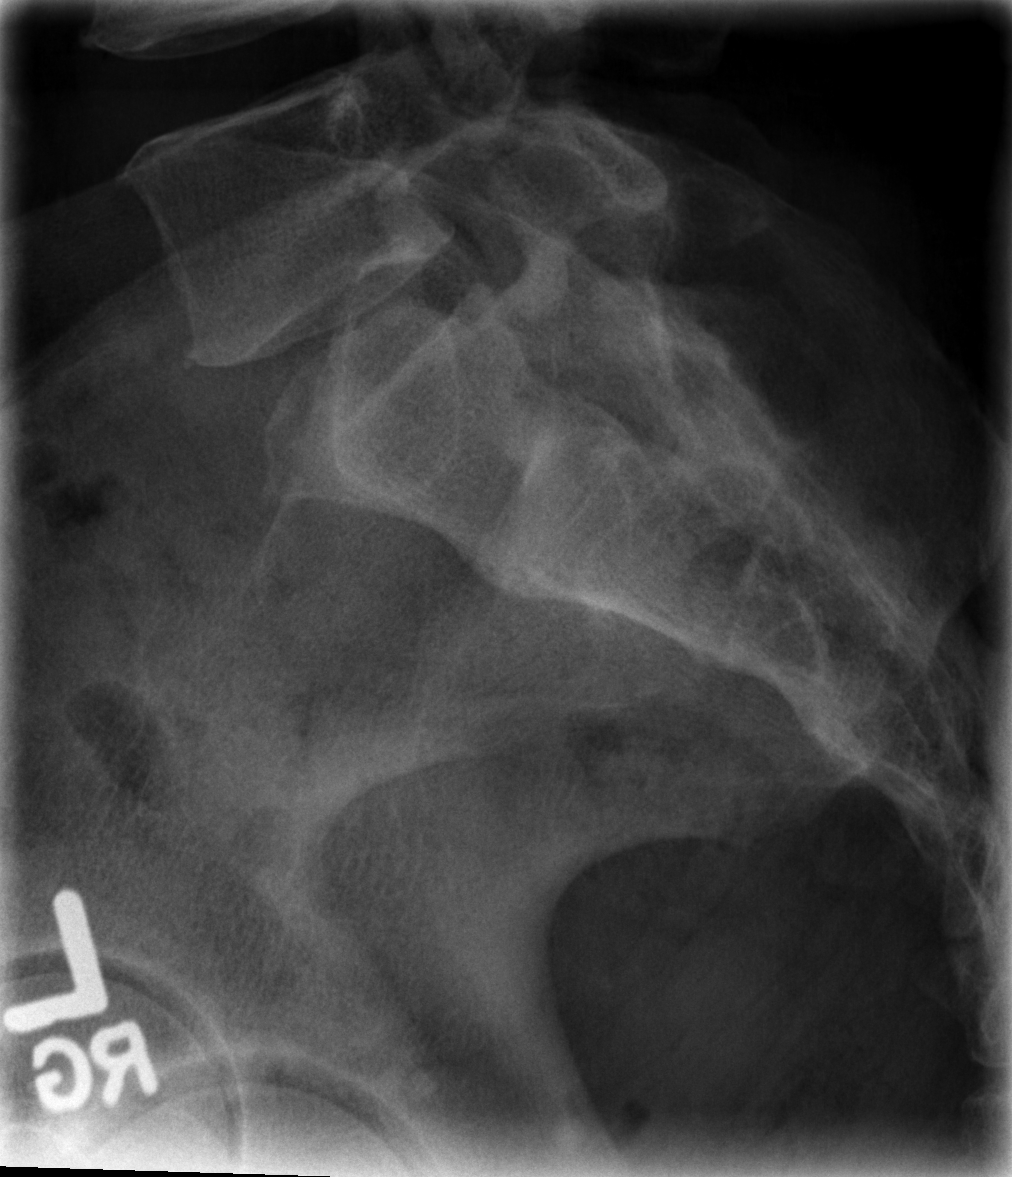

[5 of 5 positions shown; findings below may reference images not displayed]

FINDINGS: An the lumbar vertebral are in normal alignment with
normal intervertebral disc spaces.  No compression deformity is
seen.  Only mild degenerative spurring is noted.  The SI joints
appear normal.
IMPRESSION: Mild degenerative change.  Normal alignment with normal disc
spaces.

## 2008-10-26 IMAGING — CR DG CERVICAL SPINE COMPLETE 4+V
5 series · 5 of 5 positions shown · non-contrast
Comparison: None

CLINICAL DATA: Posterior neck pain, no known trauma, history breast
carcinoma

CERVICAL SPINE - COMPLETE 4+ VIEW

[w c-spine lat]
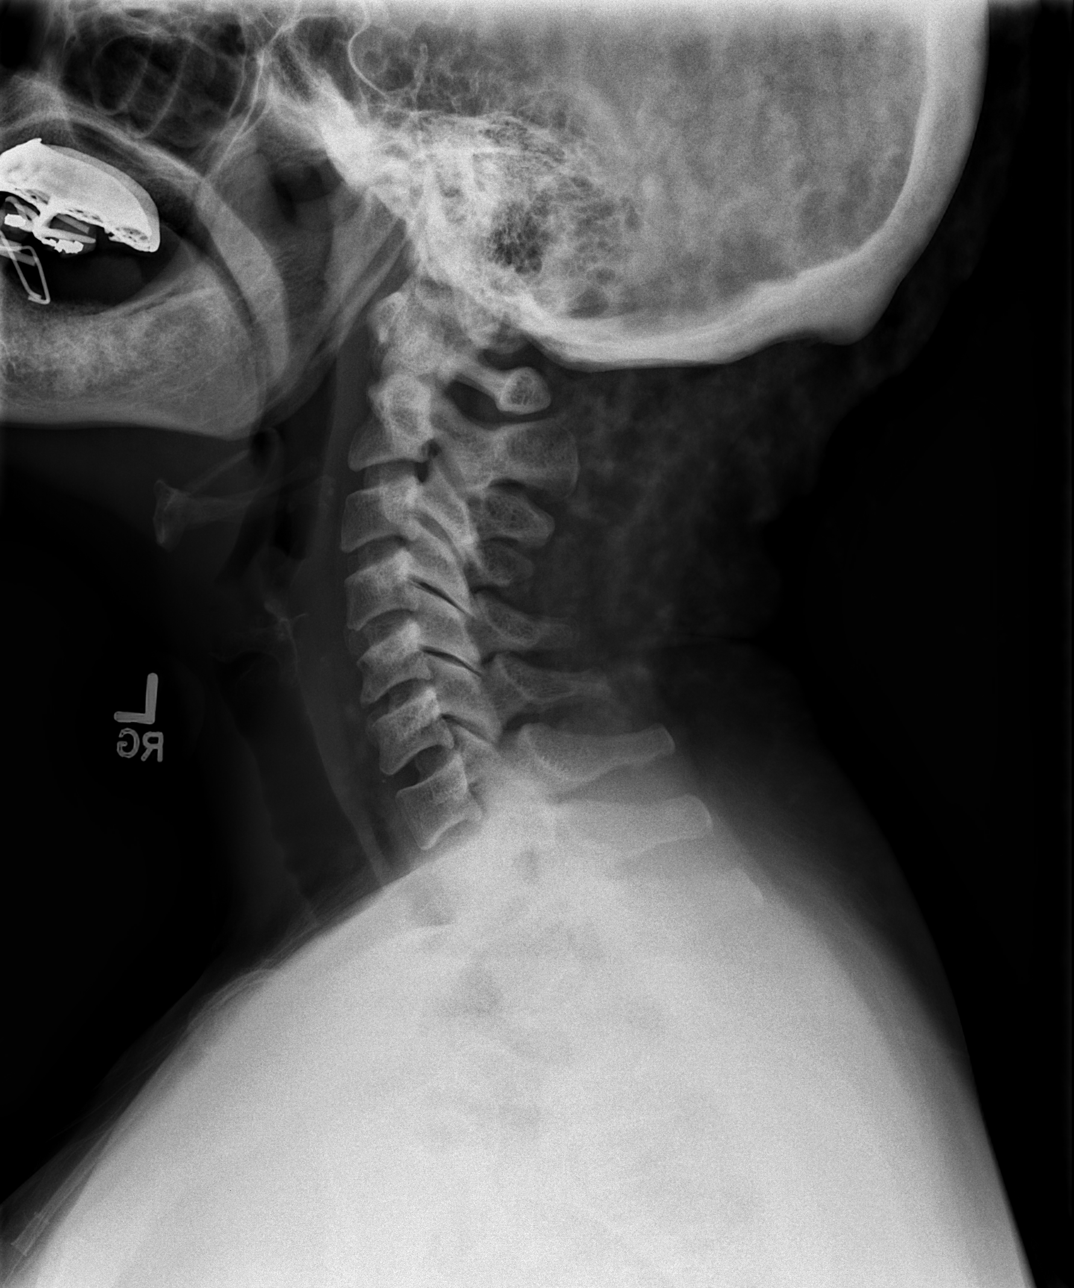

[w c-spine oblique (1 of 2)]
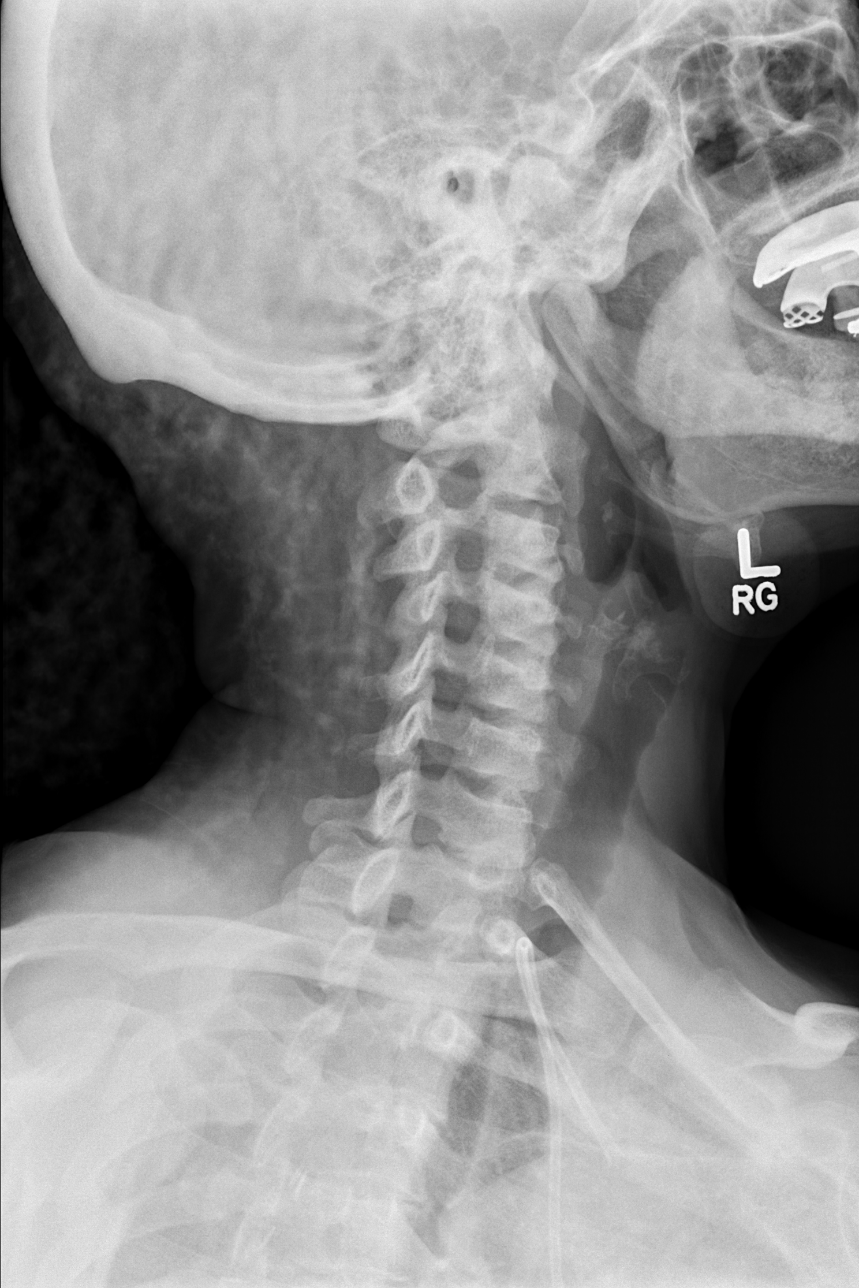

[w c-spine oblique (2 of 2)]
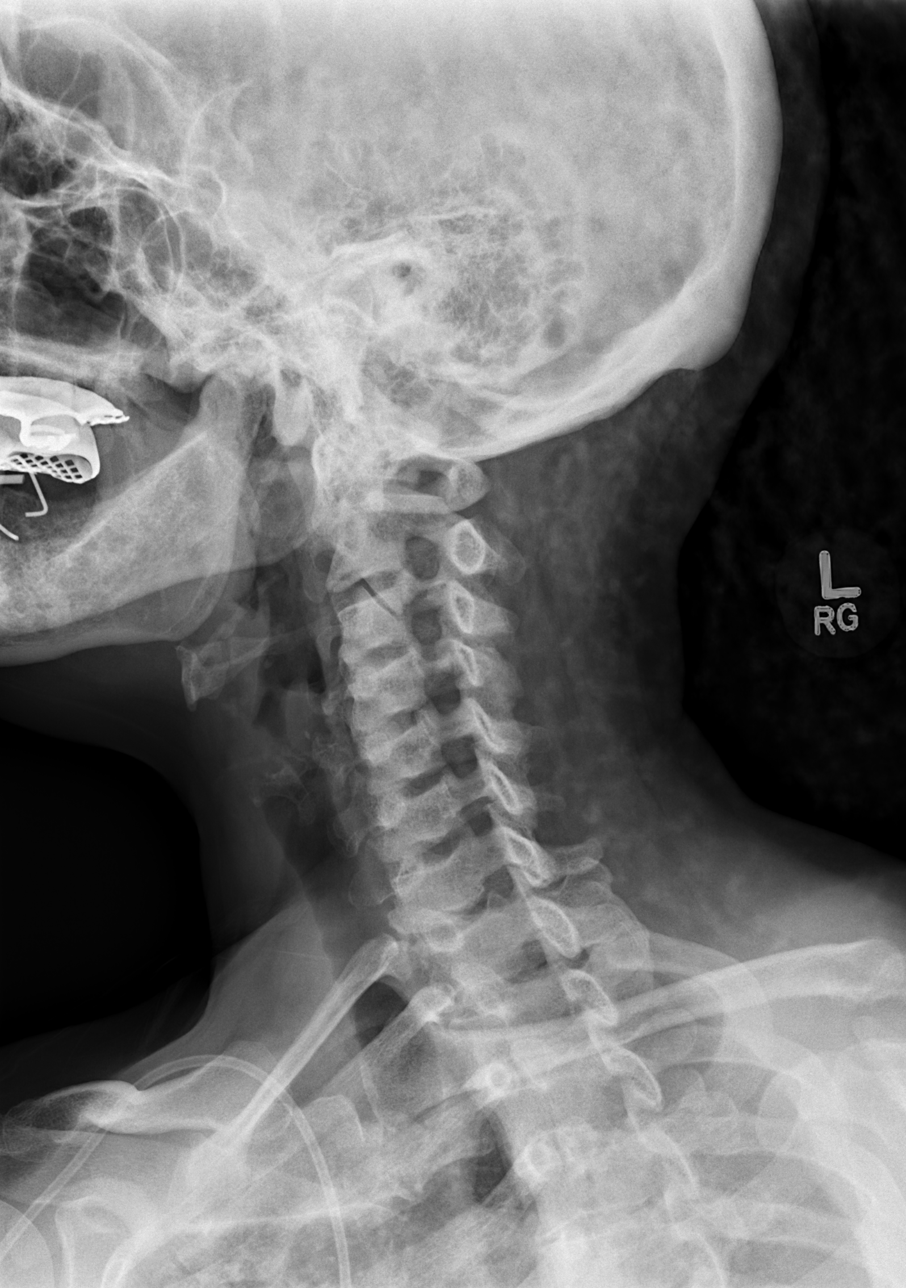

[w c-spine a.p.]
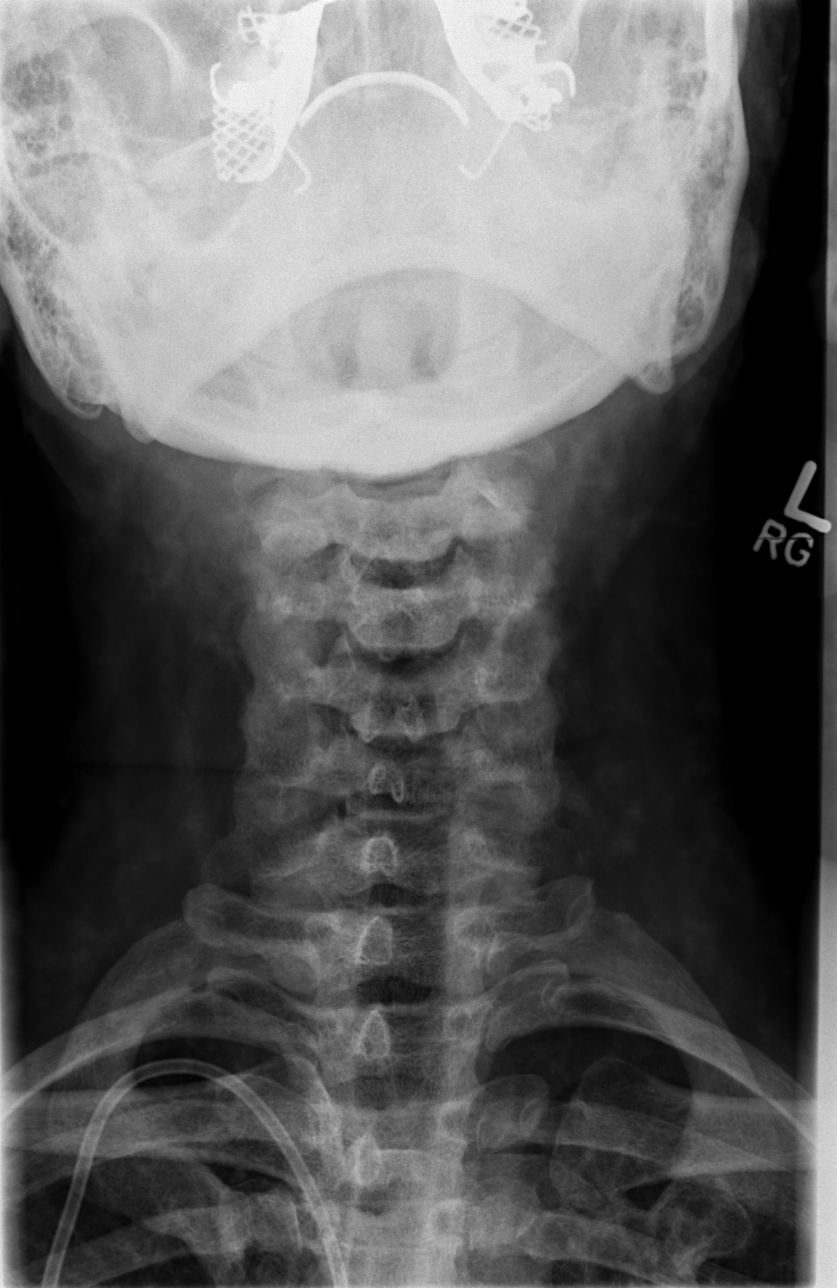

[w c-spine odontoid]
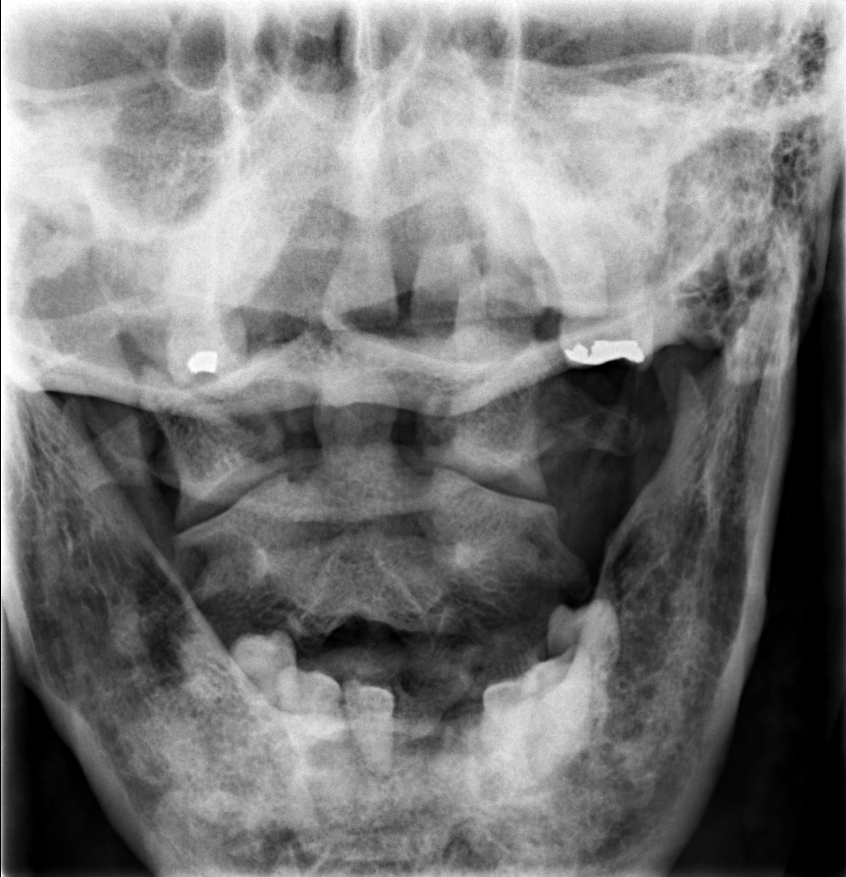

[5 of 5 positions shown; findings below may reference images not displayed]

FINDINGS: The cervical vertebral are in normal alignment with
normal intervertebral disc spaces.  No prevertebral soft tissue
swelling is seen.  On oblique views the foramina are patent.  The
odontoid process is intact.
IMPRESSION: Normal alignment with no acute abnormality.

## 2008-11-06 ENCOUNTER — Ambulatory Visit: Payer: Self-pay | Admitting: Oncology

## 2008-11-11 LAB — CBC WITH DIFFERENTIAL/PLATELET
BASO%: 0.1 % (ref 0.0–2.0)
Basophils Absolute: 0 10*3/uL (ref 0.0–0.1)
EOS%: 1.7 % (ref 0.0–7.0)
Eosinophils Absolute: 0.1 10*3/uL (ref 0.0–0.5)
HCT: 40.3 % (ref 34.8–46.6)
HGB: 13.4 g/dL (ref 11.6–15.9)
LYMPH%: 23.3 % (ref 14.0–48.0)
MCH: 28.7 pg (ref 26.0–34.0)
MCHC: 33.3 g/dL (ref 32.0–36.0)
MCV: 86.2 fL (ref 81.0–101.0)
MONO#: 0.2 10*3/uL (ref 0.1–0.9)
MONO%: 3.1 % (ref 0.0–13.0)
NEUT#: 4.8 10*3/uL (ref 1.5–6.5)
NEUT%: 71.8 % (ref 39.6–76.8)
Platelets: 251 10*3/uL (ref 145–400)
RBC: 4.67 10*6/uL (ref 3.70–5.32)
RDW: 14.1 % (ref 11.3–14.5)
WBC: 6.6 10*3/uL (ref 3.9–10.0)
lymph#: 1.6 10*3/uL (ref 0.9–3.3)

## 2008-11-11 LAB — COMPREHENSIVE METABOLIC PANEL
ALT: 11 U/L (ref 0–35)
AST: 22 U/L (ref 0–37)
Albumin: 4.4 g/dL (ref 3.5–5.2)
Alkaline Phosphatase: 83 U/L (ref 39–117)
BUN: 11 mg/dL (ref 6–23)
CO2: 26 mEq/L (ref 19–32)
Calcium: 9.4 mg/dL (ref 8.4–10.5)
Chloride: 105 mEq/L (ref 96–112)
Creatinine, Ser: 0.87 mg/dL (ref 0.40–1.20)
Glucose, Bld: 89 mg/dL (ref 70–99)
Potassium: 3.9 mEq/L (ref 3.5–5.3)
Sodium: 139 mEq/L (ref 135–145)
Total Bilirubin: 0.4 mg/dL (ref 0.3–1.2)
Total Protein: 7 g/dL (ref 6.0–8.3)

## 2008-11-19 ENCOUNTER — Emergency Department (HOSPITAL_COMMUNITY): Admission: EM | Admit: 2008-11-19 | Discharge: 2008-11-19 | Payer: Self-pay | Admitting: Family Medicine

## 2008-11-26 ENCOUNTER — Encounter: Payer: Self-pay | Admitting: Oncology

## 2008-11-26 ENCOUNTER — Ambulatory Visit: Payer: Self-pay

## 2008-12-17 ENCOUNTER — Ambulatory Visit (HOSPITAL_COMMUNITY): Admission: RE | Admit: 2008-12-17 | Discharge: 2008-12-17 | Payer: Self-pay | Admitting: Oncology

## 2008-12-19 ENCOUNTER — Ambulatory Visit: Payer: Self-pay | Admitting: Oncology

## 2008-12-23 LAB — MANUAL DIFFERENTIAL
ALC: 1.5 10*3/uL (ref 0.9–3.3)
ANC (CHCC manual diff): 3.7 10*3/uL (ref 1.5–6.5)
Band Neutrophils: 0 % (ref 0–10)
Basophil: 0 % (ref 0–2)
Blasts: 0 % (ref 0–0)
EOS: 8 % — ABNORMAL HIGH (ref 0–7)
LYMPH: 23 % (ref 14–49)
MONO: 9 % (ref 0–14)
Metamyelocytes: 0 % (ref 0–0)
Myelocytes: 0 % (ref 0–0)
Other Cell: 0 % (ref 0–0)
PLT EST: ADEQUATE
PROMYELO: 0 % (ref 0–0)
SEG: 59 % (ref 38–77)
Variant Lymph: 1 % — ABNORMAL HIGH (ref 0–0)
nRBC: 0 % (ref 0–0)

## 2008-12-23 LAB — CBC WITH DIFFERENTIAL/PLATELET
HCT: 40.7 % (ref 34.8–46.6)
HGB: 13.6 g/dL (ref 11.6–15.9)
MCH: 28.5 pg (ref 26.0–34.0)
MCHC: 33.5 g/dL (ref 32.0–36.0)
MCV: 85.2 fL (ref 81.0–101.0)
Platelets: 294 10*3/uL (ref 145–400)
RBC: 4.77 10*6/uL (ref 3.70–5.32)
RDW: 13.1 % (ref 11.3–14.5)
WBC: 6.3 10*3/uL (ref 3.9–10.0)

## 2009-01-05 IMAGING — CT CT CHEST W/ CM
2 of 5 series · 16 of 46 positions shown, 18 images · IV contrast (agent unspecified)
Comparison: 05/22/2008

CT CHEST

CLINICAL DATA: Breast cancer. This patient has a documented IV
contrast allergy.  She received an appropriate 13 hour steroid
premedication protocol.  IV contrast bolus today was well tolerated
without evidence for reaction.

CT CHEST, ABDOMEN AND PELVIS WITH CONTRAST
TECHNIQUE: Multidetector CT imaging of the chest, abdomen and
pelvis was performed following the standard protocol during bolus
administration of intravenous contrast.
Contrast: 100 ml Imnipaque-CGG

[Series 2: cap 5.0 b40f · axial · 0.82mm/px · z∈[-584,-54]mm · 13 of 122 slices shown, 15 images]
[im 8/122  soft-tissue]
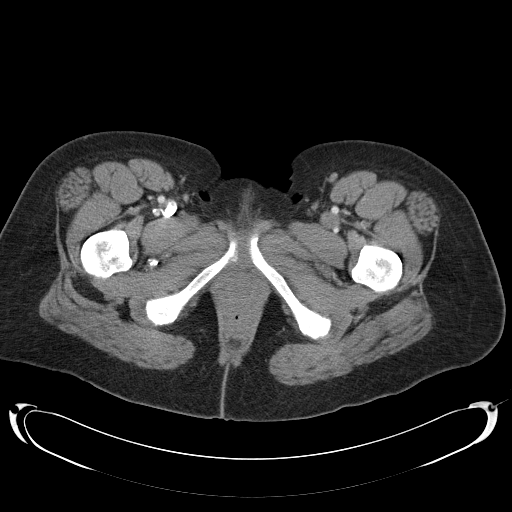
[im 8/122  bone]
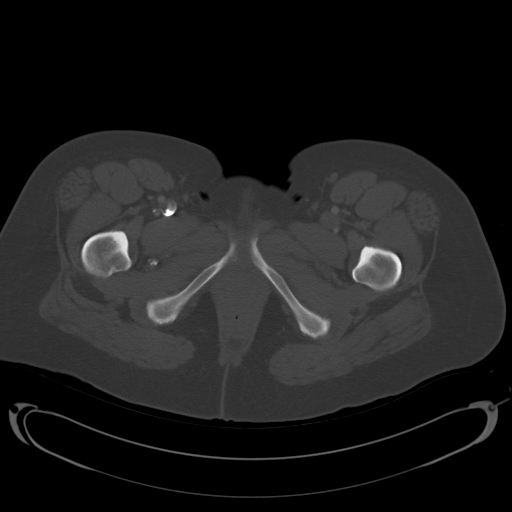
[im 15/122  soft-tissue]
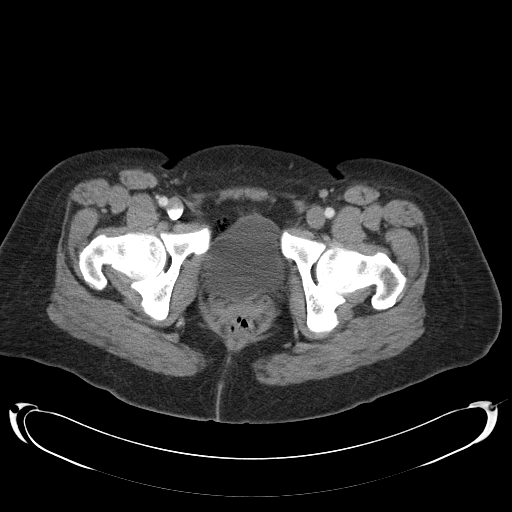
[im 29/122  soft-tissue]
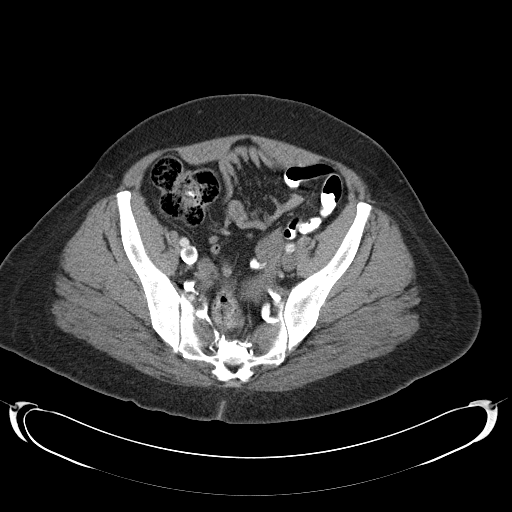
[im 36/122  soft-tissue]
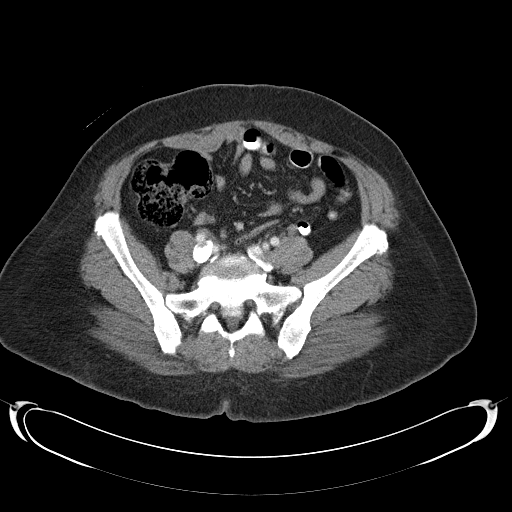
[im 43/122  soft-tissue]
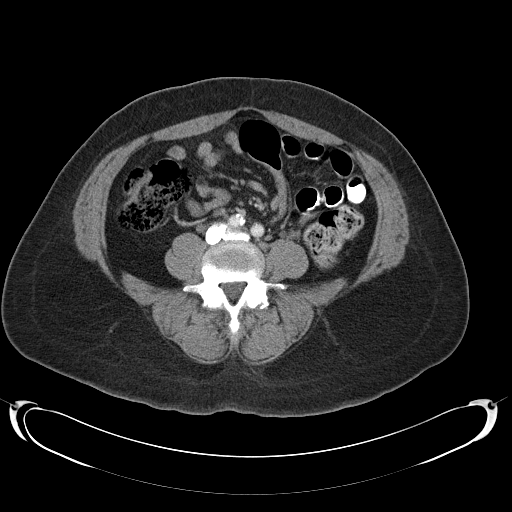
[im 50/122  soft-tissue]
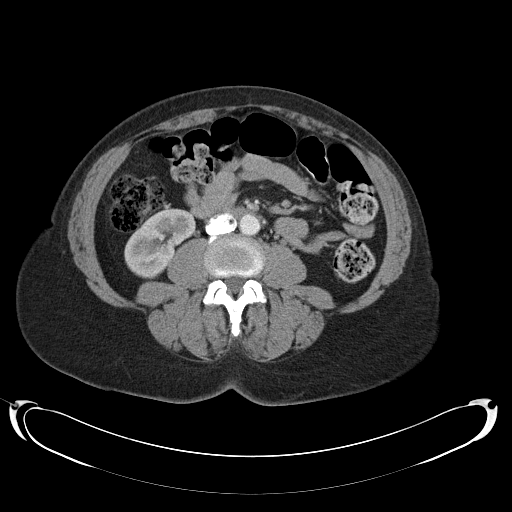
[im 65/122  soft-tissue]
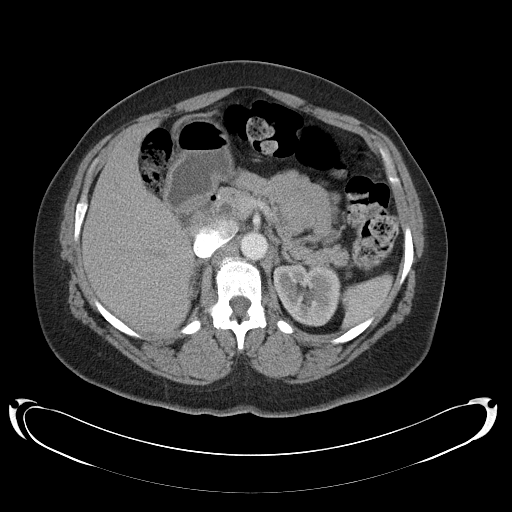
[im 72/122  soft-tissue]
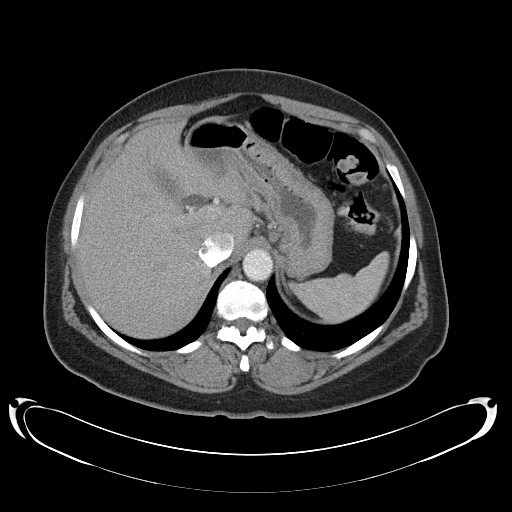
[im 79/122  soft-tissue]
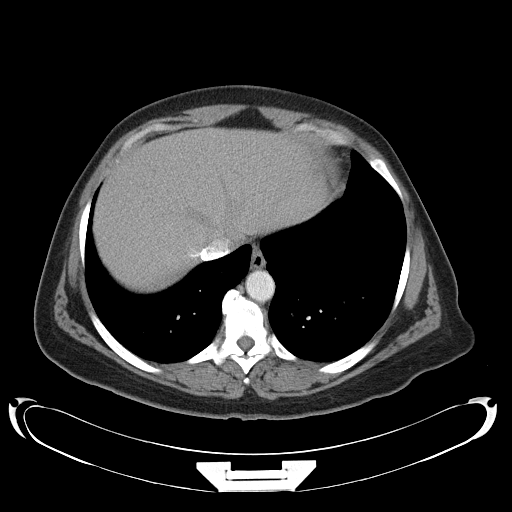
[im 79/122  bone]
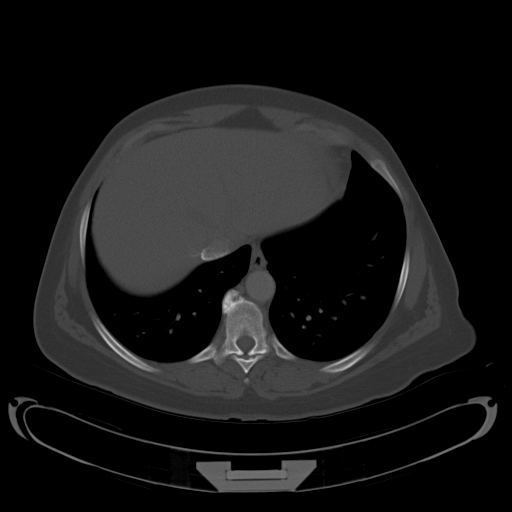
[im 86/122  soft-tissue]
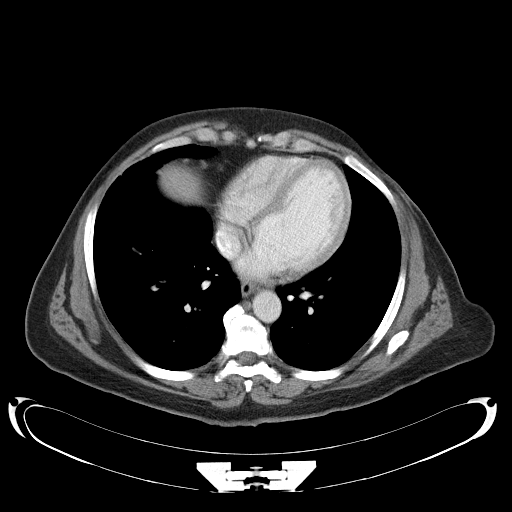
[im 93/122  soft-tissue]
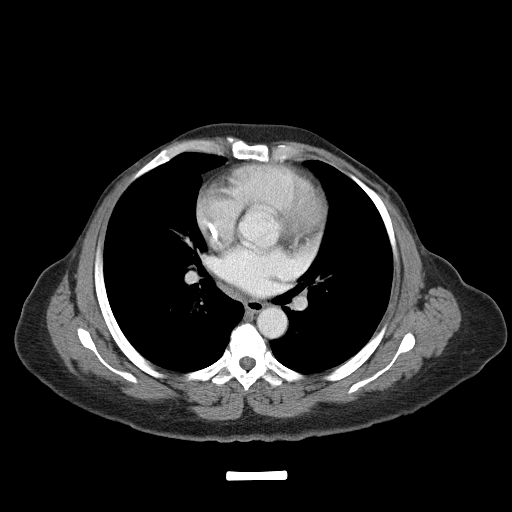
[im 107/122  soft-tissue]
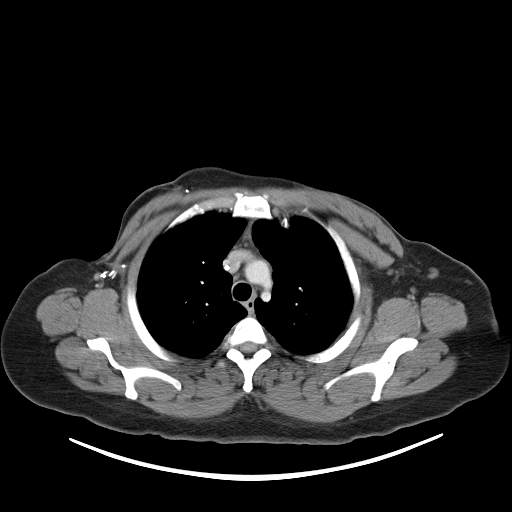
[im 114/122  soft-tissue]
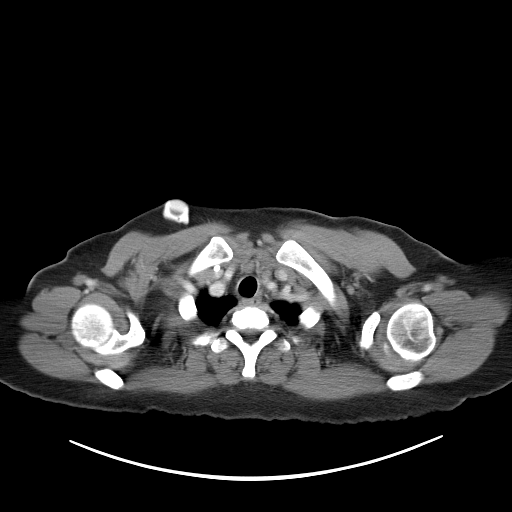

[Series 602: <mpr thick range> · coronal · 1.19mm/px · 3 of 97 slices shown]
[im 33/97  soft-tissue]
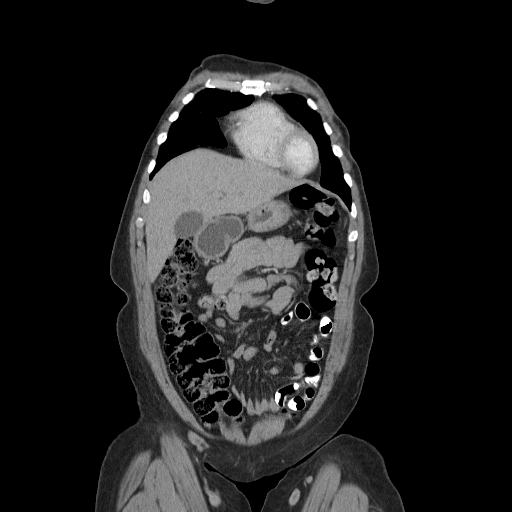
[im 43/97  soft-tissue]
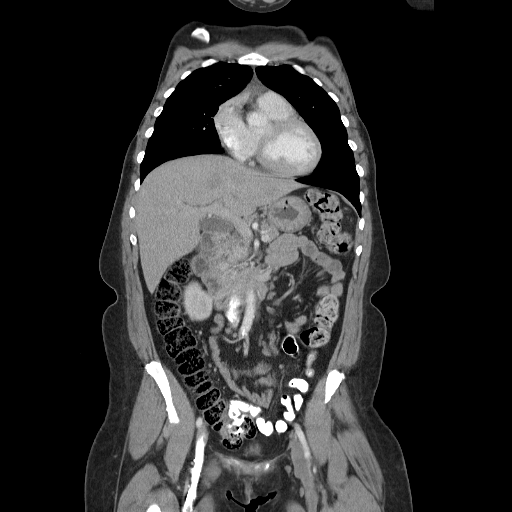
[im 54/97  soft-tissue]
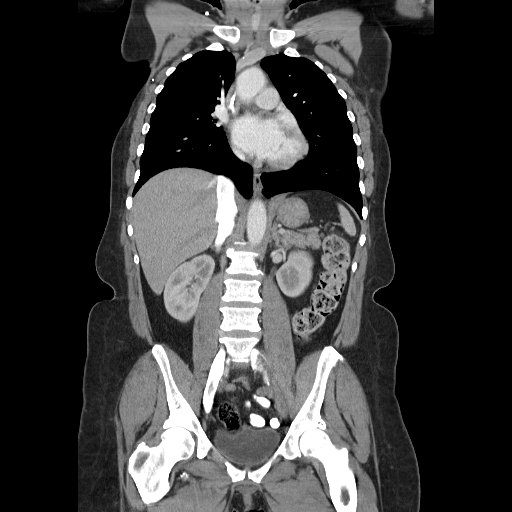

[16 of 46 positions shown; findings below may reference images not displayed]

FINDINGS: The right Port-A-Cath tip projects in the right atrium.
The patient is status post bilateral mastectomy.  Surgical clips
are seen in the right axilla.

On image 8, a new 1.5 x 1.3 cm lymph node is identified in the
right subpectoral space.  This represents the hypermetabolic lymph
nodes seen on today's PET scan.  There is no axillary
lymphadenopathy.  No mediastinal or hilar lymphadenopathy.  Heart
size is normal.  There is no pericardial or pleural effusion.

Lung windows show some chronic interstitial change in the anterior
right middle lobe, probably related to previous radiation.  This is
unchanged.  Lungs are otherwise clear without parenchymal nodule or
mass.  No focal airspace consolidation.

Bone windows show no worrisome lytic or sclerotic osseous lesions.
IMPRESSION: New 15 mm right subpectoral lymph node represents the
hypermetabolic lesion seen on today's PET scan.

Otherwise stable exam.

CT ABDOMEN
FINDINGS: There is no focal abnormality in the liver or spleen.
The mild biliary distention described in the liver on the previous
study is less apparent today.  The stomach, duodenum, pancreas,
gallbladder, adrenal glands, and kidneys have normal imaging
features.  Lymph nodes in the gastrohepatic and hepatoduodenal
ligaments are stable in the interval.

No intraperitoneal free fluid.  No abdominal aortic aneurysm.  The
abdominal bowel loops are unremarkable.
IMPRESSION: Stable CT exam of the abdomen.  No evidence for metastatic disease.

CT PELVIS
FINDINGS: No intraperitoneal free fluid.  No pelvic
lymphadenopathy.  Uterus is unremarkable.  There is no adnexal
mass.  Terminal ileum is normal.  The appendix is normal.
Adenopathy in the anatomic pelvis.

Bone windows are unremarkable.
IMPRESSION: Stable CT exam of the pelvis.  No evidence for metastatic disease.

## 2009-01-05 IMAGING — PT NM PET TUM IMG SKULL BASE T - THIGH
6 series · 25 of 25 positions shown · non-contrast
Comparison: 05/22/2008

CLINICAL DATA: Subsequent treatment strategy for breast cancer.  ]

NUCLEAR MEDICINE WHOLE BODY PET/CT
TECHNIQUE: 15.6 mCi F-18 FDG was injected intravenously via the
right foot.  Full-ring PET imaging was performed from the skull
vertex through the lower extremities 65 minutes after injection.
CT data was obtained and used for attenuation correction and
anatomic localization only.  (This was not acquired as a diagnostic
CT examination.)
Fasting Blood Glucose:  107

[Series 1: pet ac · axial · 3.3mm · 4.69mm/px · z∈[-726,+0]mm · 5 of 223 slices shown]
[im 1/223]
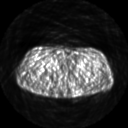
[im 56/223]
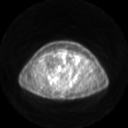
[im 112/223]
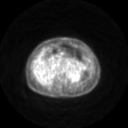
[im 167/223]
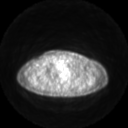
[im 223/223]
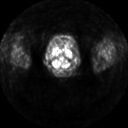

[Series 2: ct images · axial · 3.8mm · 0.98mm/px · z∈[-726,+0]mm · 5 of 223 slices shown]
[im 1/223]
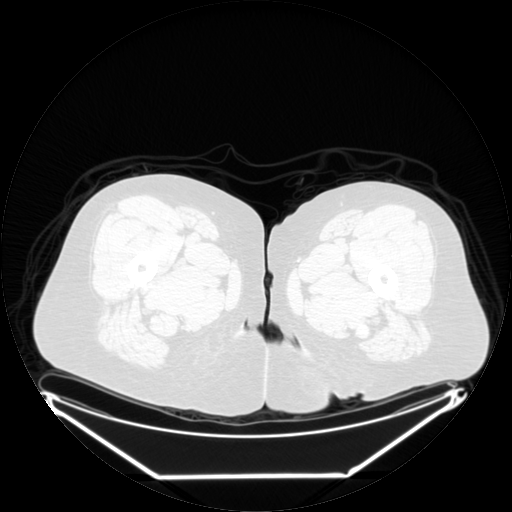
[im 56/223]
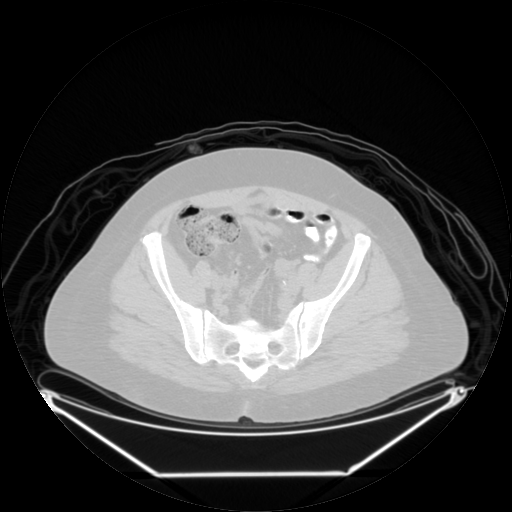
[im 112/223]
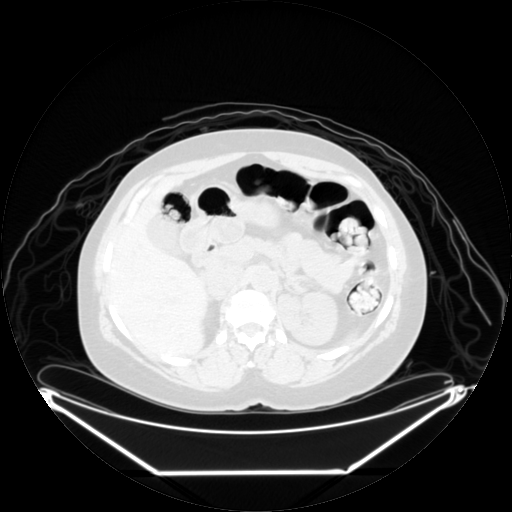
[im 167/223]
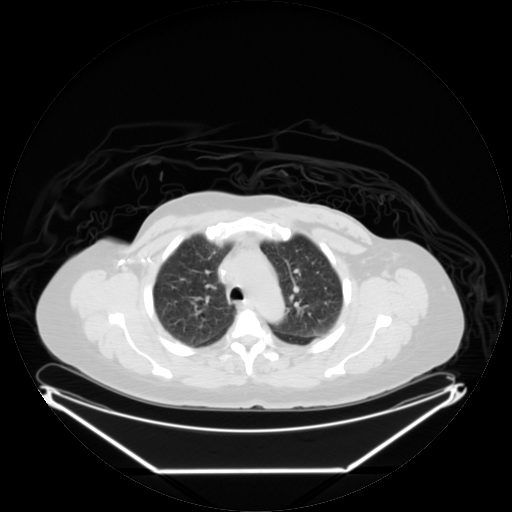
[im 223/223]
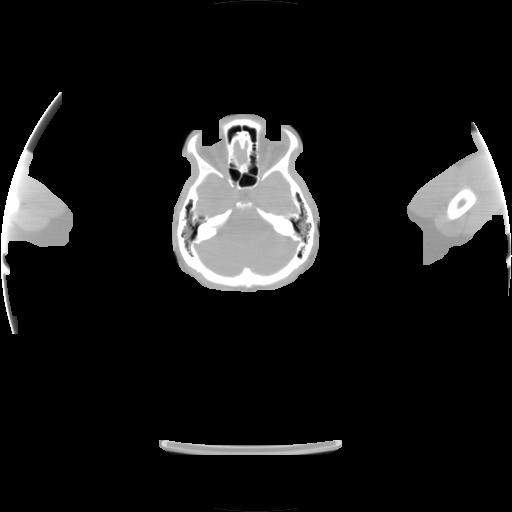

[Series 2: pet nac · axial · 3.3mm · 4.69mm/px · z∈[-726,+0]mm · 6 of 223 slices shown]
[im 1/223]
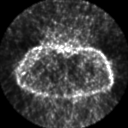
[im 45/223]
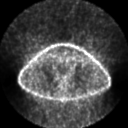
[im 89/223]
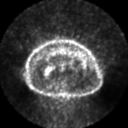
[im 134/223]
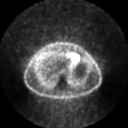
[im 178/223]
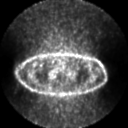
[im 223/223]
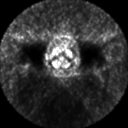

[Series 123: mip · coronal · 3.3mm · 4.69mm/px · 1 of 30 slices shown]
[im 1/30]
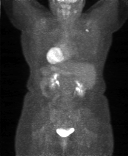

[Series 151: reformatted · axial · 3.3mm · 3.91mm/px · z∈[-726,+0]mm · 6 of 221 slices shown (1 of 2)]
[im 1/221]
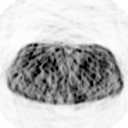
[im 45/221]
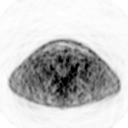
[im 89/221]
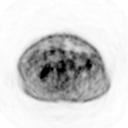
[im 133/221]
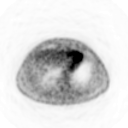
[im 177/221]
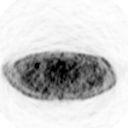
[im 221/221]
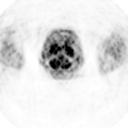

[Series 153: reformatted · coronal · 4.7mm · 5.83mm/px · 2 of 71 slices shown (2 of 2)]
[im 1/71]
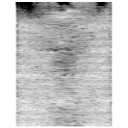
[im 71/71]
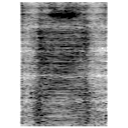

[25 of 25 positions shown; findings below may reference images not displayed]

FINDINGS: The patient has a single worrisome focus of
hypermetabolic F D G accumulation.  This activity corresponds to a
1.5 x 1.4 cm right subpectoral lymph node.  S U V-max for this
lymph node is 7.2.  The finding is new in the interval since the
previous PET CT.

No other unexpected or suspicious foci of hypermetabolic F D G
uptake are seen in the neck, chest, abdomen, or pelvis.

The tip of the right-sided Port-A-Cath is positioned in the mid
right atrium.  The patient apparently status post bilateral
mastectomy.  Surgical clips are seen in the right axilla.
IMPRESSION: Interval development of a hypermetabolic right subpectoral lymph
node, highly suspicious for recurrent disease.

## 2009-01-13 LAB — CBC WITH DIFFERENTIAL/PLATELET
BASO%: 0.7 % (ref 0.0–2.0)
Basophils Absolute: 0.1 10*3/uL (ref 0.0–0.1)
EOS%: 5 % (ref 0.0–7.0)
Eosinophils Absolute: 0.4 10*3/uL (ref 0.0–0.5)
HCT: 39.2 % (ref 34.8–46.6)
HGB: 13.2 g/dL (ref 11.6–15.9)
LYMPH%: 32.1 % (ref 14.0–49.7)
MCH: 27.9 pg (ref 25.1–34.0)
MCHC: 33.7 g/dL (ref 31.5–36.0)
MCV: 82.9 fL (ref 79.5–101.0)
MONO#: 0.8 10*3/uL (ref 0.1–0.9)
MONO%: 11.3 % (ref 0.0–14.0)
NEUT#: 3.6 10*3/uL (ref 1.5–6.5)
NEUT%: 50.9 % (ref 38.4–76.8)
Platelets: 265 10*3/uL (ref 145–400)
RBC: 4.73 10*6/uL (ref 3.70–5.45)
RDW: 13.6 % (ref 11.2–14.5)
WBC: 7.1 10*3/uL (ref 3.9–10.3)
lymph#: 2.3 10*3/uL (ref 0.9–3.3)
nRBC: 0 % (ref 0–0)

## 2009-01-13 LAB — COMPREHENSIVE METABOLIC PANEL
ALT: 24 U/L (ref 0–35)
AST: 26 U/L (ref 0–37)
Albumin: 4 g/dL (ref 3.5–5.2)
Alkaline Phosphatase: 107 U/L (ref 39–117)
BUN: 11 mg/dL (ref 6–23)
CO2: 27 mEq/L (ref 19–32)
Calcium: 9.2 mg/dL (ref 8.4–10.5)
Chloride: 104 mEq/L (ref 96–112)
Creatinine, Ser: 0.82 mg/dL (ref 0.40–1.20)
Glucose, Bld: 92 mg/dL (ref 70–99)
Potassium: 4 mEq/L (ref 3.5–5.3)
Sodium: 140 mEq/L (ref 135–145)
Total Bilirubin: 0.2 mg/dL — ABNORMAL LOW (ref 0.3–1.2)
Total Protein: 6.7 g/dL (ref 6.0–8.3)

## 2009-01-13 LAB — CANCER ANTIGEN 27.29: CA 27.29: 32 U/mL (ref 0–39)

## 2009-02-03 ENCOUNTER — Ambulatory Visit: Payer: Self-pay | Admitting: Oncology

## 2009-02-19 ENCOUNTER — Encounter: Payer: Self-pay | Admitting: Oncology

## 2009-02-19 ENCOUNTER — Ambulatory Visit: Payer: Self-pay

## 2009-02-26 DIAGNOSIS — K219 Gastro-esophageal reflux disease without esophagitis: Secondary | ICD-10-CM | POA: Insufficient documentation

## 2009-02-26 DIAGNOSIS — I10 Essential (primary) hypertension: Secondary | ICD-10-CM | POA: Insufficient documentation

## 2009-02-26 LAB — CANCER ANTIGEN 27.29: CA 27.29: 35 U/mL (ref 0–39)

## 2009-02-26 LAB — CBC WITH DIFFERENTIAL/PLATELET
BASO%: 0.8 % (ref 0.0–2.0)
Basophils Absolute: 0.1 10e3/uL (ref 0.0–0.1)
EOS%: 4.6 % (ref 0.0–7.0)
Eosinophils Absolute: 0.3 10e3/uL (ref 0.0–0.5)
HCT: 39.4 % (ref 34.8–46.6)
HGB: 13.1 g/dL (ref 11.6–15.9)
LYMPH%: 32.7 % (ref 14.0–49.7)
MCH: 27.5 pg (ref 25.1–34.0)
MCHC: 33.2 g/dL (ref 31.5–36.0)
MCV: 82.6 fL (ref 79.5–101.0)
MONO#: 0.6 10e3/uL (ref 0.1–0.9)
MONO%: 10.1 % (ref 0.0–14.0)
NEUT#: 3.2 10e3/uL (ref 1.5–6.5)
NEUT%: 51.8 % (ref 38.4–76.8)
Platelets: 259 10e3/uL (ref 145–400)
RBC: 4.77 10e6/uL (ref 3.70–5.45)
RDW: 14 % (ref 11.2–14.5)
WBC: 6.3 10e3/uL (ref 3.9–10.3)
lymph#: 2.1 10e3/uL (ref 0.9–3.3)

## 2009-02-26 LAB — COMPREHENSIVE METABOLIC PANEL
ALT: 14 U/L (ref 0–35)
AST: 21 U/L (ref 0–37)
Albumin: 4.1 g/dL (ref 3.5–5.2)
Alkaline Phosphatase: 100 U/L (ref 39–117)
BUN: 10 mg/dL (ref 6–23)
CO2: 27 mEq/L (ref 19–32)
Calcium: 9.3 mg/dL (ref 8.4–10.5)
Chloride: 104 mEq/L (ref 96–112)
Creatinine, Ser: 0.9 mg/dL (ref 0.40–1.20)
Glucose, Bld: 96 mg/dL (ref 70–99)
Potassium: 4.2 mEq/L (ref 3.5–5.3)
Sodium: 139 mEq/L (ref 135–145)
Total Bilirubin: 0.3 mg/dL (ref 0.3–1.2)
Total Protein: 7.1 g/dL (ref 6.0–8.3)

## 2009-02-26 LAB — LACTATE DEHYDROGENASE: LDH: 178 U/L (ref 94–250)

## 2009-03-17 ENCOUNTER — Encounter: Payer: Self-pay | Admitting: Cardiology

## 2009-03-17 ENCOUNTER — Ambulatory Visit: Payer: Self-pay | Admitting: Oncology

## 2009-03-17 ENCOUNTER — Ambulatory Visit (HOSPITAL_COMMUNITY): Admission: RE | Admit: 2009-03-17 | Discharge: 2009-03-17 | Payer: Self-pay | Admitting: Oncology

## 2009-03-19 LAB — CBC WITH DIFFERENTIAL/PLATELET
BASO%: 0.5 % (ref 0.0–2.0)
Basophils Absolute: 0.1 10*3/uL (ref 0.0–0.1)
EOS%: 4.1 % (ref 0.0–7.0)
Eosinophils Absolute: 0.4 10*3/uL (ref 0.0–0.5)
HCT: 37 % (ref 34.8–46.6)
HGB: 12.1 g/dL (ref 11.6–15.9)
LYMPH%: 22.3 % (ref 14.0–49.7)
MCH: 27.4 pg (ref 25.1–34.0)
MCHC: 32.7 g/dL (ref 31.5–36.0)
MCV: 83.9 fL (ref 79.5–101.0)
MONO#: 1.1 10*3/uL — ABNORMAL HIGH (ref 0.1–0.9)
MONO%: 11.4 % (ref 0.0–14.0)
NEUT#: 5.7 10*3/uL (ref 1.5–6.5)
NEUT%: 61.7 % (ref 38.4–76.8)
Platelets: 289 10*3/uL (ref 145–400)
RBC: 4.41 10*6/uL (ref 3.70–5.45)
RDW: 14.4 % (ref 11.2–14.5)
WBC: 9.2 10*3/uL (ref 3.9–10.3)
lymph#: 2.1 10*3/uL (ref 0.9–3.3)

## 2009-03-19 LAB — COMPREHENSIVE METABOLIC PANEL
ALT: 16 U/L (ref 0–35)
AST: 23 U/L (ref 0–37)
Albumin: 3.8 g/dL (ref 3.5–5.2)
Alkaline Phosphatase: 109 U/L (ref 39–117)
BUN: 16 mg/dL (ref 6–23)
CO2: 30 mEq/L (ref 19–32)
Calcium: 9.2 mg/dL (ref 8.4–10.5)
Chloride: 102 mEq/L (ref 96–112)
Creatinine, Ser: 0.9 mg/dL (ref 0.40–1.20)
Glucose, Bld: 98 mg/dL (ref 70–99)
Potassium: 4.2 mEq/L (ref 3.5–5.3)
Sodium: 141 mEq/L (ref 135–145)
Total Bilirubin: 0.3 mg/dL (ref 0.3–1.2)
Total Protein: 7 g/dL (ref 6.0–8.3)

## 2009-03-19 LAB — LACTATE DEHYDROGENASE: LDH: 190 U/L (ref 94–250)

## 2009-04-09 LAB — CBC WITH DIFFERENTIAL/PLATELET
BASO%: 0.4 % (ref 0.0–2.0)
Basophils Absolute: 0 10*3/uL (ref 0.0–0.1)
EOS%: 6 % (ref 0.0–7.0)
Eosinophils Absolute: 0.4 10*3/uL (ref 0.0–0.5)
HCT: 35.9 % (ref 34.8–46.6)
HGB: 11.5 g/dL — ABNORMAL LOW (ref 11.6–15.9)
LYMPH%: 30.9 % (ref 14.0–49.7)
MCH: 27.6 pg (ref 25.1–34.0)
MCHC: 32 g/dL (ref 31.5–36.0)
MCV: 86.3 fL (ref 79.5–101.0)
MONO#: 0.8 10*3/uL (ref 0.1–0.9)
MONO%: 11 % (ref 0.0–14.0)
NEUT#: 3.6 10*3/uL (ref 1.5–6.5)
NEUT%: 51.7 % (ref 38.4–76.8)
Platelets: 231 10*3/uL (ref 145–400)
RBC: 4.16 10*6/uL (ref 3.70–5.45)
RDW: 15.7 % — ABNORMAL HIGH (ref 11.2–14.5)
WBC: 7 10*3/uL (ref 3.9–10.3)
lymph#: 2.2 10*3/uL (ref 0.9–3.3)

## 2009-04-09 LAB — COMPREHENSIVE METABOLIC PANEL
ALT: 20 U/L (ref 0–35)
AST: 30 U/L (ref 0–37)
Albumin: 3.7 g/dL (ref 3.5–5.2)
Alkaline Phosphatase: 116 U/L (ref 39–117)
BUN: 9 mg/dL (ref 6–23)
CO2: 30 mEq/L (ref 19–32)
Calcium: 8.8 mg/dL (ref 8.4–10.5)
Chloride: 104 mEq/L (ref 96–112)
Creatinine, Ser: 0.93 mg/dL (ref 0.40–1.20)
Glucose, Bld: 94 mg/dL (ref 70–99)
Potassium: 4.1 mEq/L (ref 3.5–5.3)
Sodium: 142 mEq/L (ref 135–145)
Total Bilirubin: 0.3 mg/dL (ref 0.3–1.2)
Total Protein: 6.7 g/dL (ref 6.0–8.3)

## 2009-04-09 LAB — LACTATE DEHYDROGENASE: LDH: 204 U/L (ref 94–250)

## 2009-04-28 ENCOUNTER — Ambulatory Visit: Payer: Self-pay | Admitting: Oncology

## 2009-04-30 LAB — CBC WITH DIFFERENTIAL/PLATELET
BASO%: 0.6 % (ref 0.0–2.0)
Basophils Absolute: 0.1 10*3/uL (ref 0.0–0.1)
EOS%: 4.2 % (ref 0.0–7.0)
Eosinophils Absolute: 0.4 10*3/uL (ref 0.0–0.5)
HCT: 36.4 % (ref 34.8–46.6)
HGB: 11.9 g/dL (ref 11.6–15.9)
LYMPH%: 31.3 % (ref 14.0–49.7)
MCH: 28.3 pg (ref 25.1–34.0)
MCHC: 32.7 g/dL (ref 31.5–36.0)
MCV: 86.7 fL (ref 79.5–101.0)
MONO#: 1.1 10*3/uL — ABNORMAL HIGH (ref 0.1–0.9)
MONO%: 12.9 % (ref 0.0–14.0)
NEUT#: 4.3 10*3/uL (ref 1.5–6.5)
NEUT%: 51 % (ref 38.4–76.8)
Platelets: 299 10*3/uL (ref 145–400)
RBC: 4.2 10*6/uL (ref 3.70–5.45)
RDW: 15.9 % — ABNORMAL HIGH (ref 11.2–14.5)
WBC: 8.5 10*3/uL (ref 3.9–10.3)
lymph#: 2.7 10*3/uL (ref 0.9–3.3)
nRBC: 0 % (ref 0–0)

## 2009-04-30 LAB — COMPREHENSIVE METABOLIC PANEL
ALT: 14 U/L (ref 0–35)
AST: 22 U/L (ref 0–37)
Albumin: 4 g/dL (ref 3.5–5.2)
Alkaline Phosphatase: 134 U/L — ABNORMAL HIGH (ref 39–117)
BUN: 11 mg/dL (ref 6–23)
CO2: 27 mEq/L (ref 19–32)
Calcium: 9 mg/dL (ref 8.4–10.5)
Chloride: 100 mEq/L (ref 96–112)
Creatinine, Ser: 0.94 mg/dL (ref 0.40–1.20)
Glucose, Bld: 106 mg/dL — ABNORMAL HIGH (ref 70–99)
Potassium: 3.6 mEq/L (ref 3.5–5.3)
Sodium: 140 mEq/L (ref 135–145)
Total Bilirubin: 0.2 mg/dL — ABNORMAL LOW (ref 0.3–1.2)
Total Protein: 7.2 g/dL (ref 6.0–8.3)

## 2009-04-30 LAB — CANCER ANTIGEN 27.29: CA 27.29: 39 U/mL (ref 0–39)

## 2009-04-30 LAB — LACTATE DEHYDROGENASE: LDH: 208 U/L (ref 94–250)

## 2009-05-02 IMAGING — PT NM PET TUM IMG RESTAG (PS) SKULL BASE T - THIGH
7 series · 25 of 25 positions shown · non-contrast
Comparison: PET CT scan 08/22/2008

CLINICAL DATA: Subsequent treatment strategy for Breast cancer.
Chemotherapy completed.  Herceptin  in progress

NUCLEAR MEDICINE PET SKULL BASE TO THIGH
Fasting Blood Glucose:  101
TECHNIQUE: 18.1  mCi F-18 FDG was injected intravenously via the
right foot.  Full-ring PET imaging was performed from the skull
base through the mid-thighs 65  minutes after injection.  CT data
was obtained and used for attenuation correction and anatomic
localization only.  (This was not acquired as a diagnostic CT
examination.)

[Series 1: pet ac · axial · 3.3mm · 4.69mm/px · z∈[-733,-7]mm · 5 of 223 slices shown]
[im 1/223]
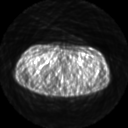
[im 56/223]
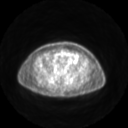
[im 112/223]
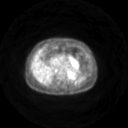
[im 167/223]
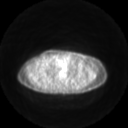
[im 223/223]
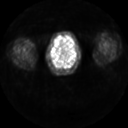

[Series 2: ct images · axial · 3.8mm · 0.98mm/px · z∈[-733,-8]mm · 5 of 223 slices shown]
[im 1/223]
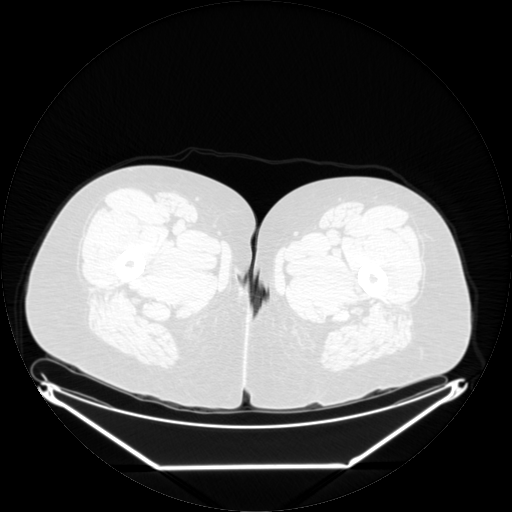
[im 56/223]
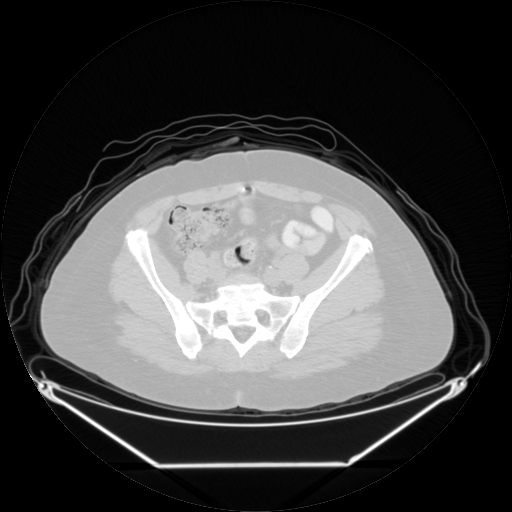
[im 112/223]
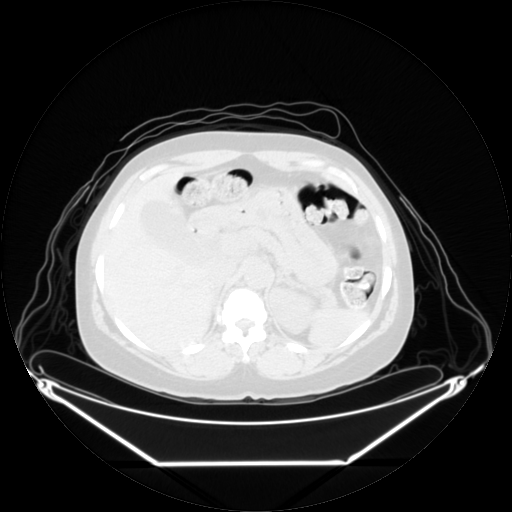
[im 167/223]
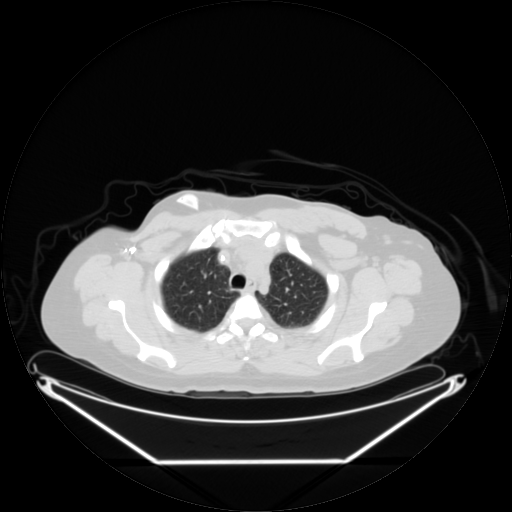
[im 223/223  brain]
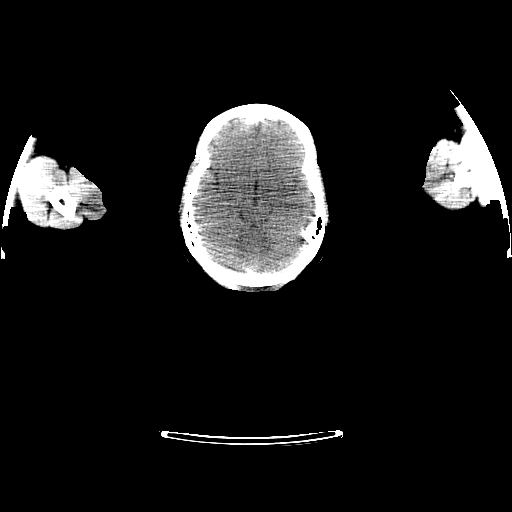

[Series 2: pet nac · axial · 3.3mm · 4.69mm/px · z∈[-733,-7]mm · 5 of 223 slices shown]
[im 1/223]
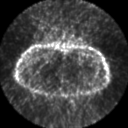
[im 56/223]
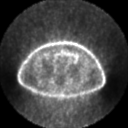
[im 112/223]
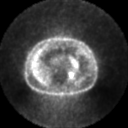
[im 167/223]
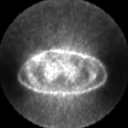
[im 223/223]
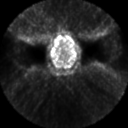

[Series 123: mip · coronal · 3.3mm · 4.69mm/px · 1 of 30 slices shown]
[im 1/30]
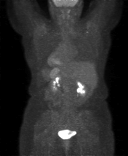

[Series 150: reformatted · axial · 3.3mm · 1.17mm/px · 1 of 5 slices shown (1 of 3)]
[im 1/5]
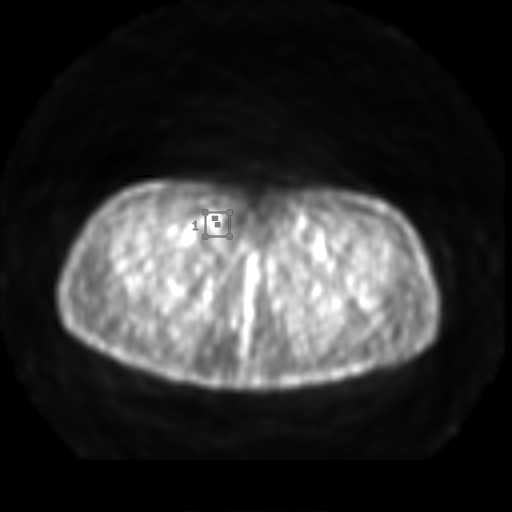

[Series 151: reformatted · axial · 3.3mm · 3.91mm/px · z∈[-733,-7]mm · 6 of 223 slices shown (2 of 3)]
[im 1/223]
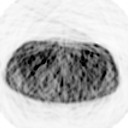
[im 45/223]
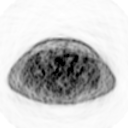
[im 89/223]
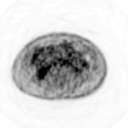
[im 134/223]
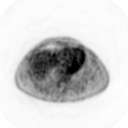
[im 178/223]
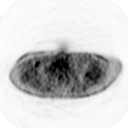
[im 223/223]
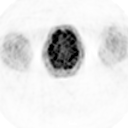

[Series 153: reformatted · coronal · 4.7mm · 5.83mm/px · 2 of 73 slices shown (3 of 3)]
[im 1/73]
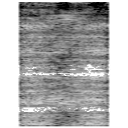
[im 73/73]
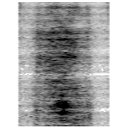

[25 of 25 positions shown; findings below may reference images not displayed]

FINDINGS: Neck:

 There is mild F D G uptake (S U V max 3.1) within a 8 mm level II
lymph node on the right (image 21) which  is decreased from SUV max
5.1 on prior.  This node also appears decreased in size measuring
11 mm short axis on prior. There is no significant residual
activity in left  level II cervical lymph node which was previously
hypermetabolic.

Chest:

Hypermetabolic activity associate with the right sub pectoralis
lymph node remains intense but is decreased from prior with S U V
max equal 5.0 compared to 7.2 on prior.  The size of this lymph
node is decreased measuring 9 mm short axis compared to 12mm.
(Image 46).

No new hypermetabolic axillary nodes.  There is a 7 mm short axis
left sub pectoralis lymph node (image 58) which is unchanged in
size and with metabolic activity similar to background

No evidence of mediastinal or internal mammary hypermetabolic
nodes.  No hypermetabolic pulmonary nodules.

Abdomen / Pelvis:

 No abnormal hypermetabolic activity within the solid organs.
Intense metabolic activity diffusely in the gastric mucosa is felt
to be physiologic.    No hypermetabolic abdominal or pelvic lymph
nodes.  There is mild hypermetabolic activity associated with small
inguinal lymph nodes which is felt to be reactive in nature.

Skeleton:

No focal hypermetabolic activity to suggest skeletal metastasis.
IMPRESSION: 1.  Interval significant decrease in metabolic activity of right
sub pectoralis lymph node.  This node does however  have fairly
intense residual metabolic activity suggesting residual disease.

2.  Decrease in metabolic activity of of cervical lymph nodes with
mild residual activity in the right level II node.  Differential
includes resolving inflammatory adenopathy versus positive
response to chemotherapy.

3.  No evidence of new nodal activity to suggest new nodal
metastasis.
4.  Mild uptake associated with left sub pectoralis lymph node and
inguinal lymph nodes is felt to be reactive.  Recommend attention
on follow-up.

REF:W2 DICTATED: 12/17/2008 [DATE]

## 2009-05-06 ENCOUNTER — Encounter: Admission: RE | Admit: 2009-05-06 | Discharge: 2009-06-16 | Payer: Self-pay | Admitting: Oncology

## 2009-05-15 ENCOUNTER — Ambulatory Visit: Payer: Self-pay

## 2009-05-15 ENCOUNTER — Encounter: Payer: Self-pay | Admitting: Oncology

## 2009-05-18 ENCOUNTER — Ambulatory Visit: Payer: Self-pay | Admitting: Genetic Counselor

## 2009-05-21 LAB — CBC WITH DIFFERENTIAL/PLATELET
BASO%: 0.9 % (ref 0.0–2.0)
Basophils Absolute: 0.1 10*3/uL (ref 0.0–0.1)
EOS%: 5.5 % (ref 0.0–7.0)
Eosinophils Absolute: 0.4 10*3/uL (ref 0.0–0.5)
HCT: 37.3 % (ref 34.8–46.6)
HGB: 12.3 g/dL (ref 11.6–15.9)
LYMPH%: 29.4 % (ref 14.0–49.7)
MCH: 28.1 pg (ref 25.1–34.0)
MCHC: 33 g/dL (ref 31.5–36.0)
MCV: 85.4 fL (ref 79.5–101.0)
MONO#: 0.9 10*3/uL (ref 0.1–0.9)
MONO%: 12.4 % (ref 0.0–14.0)
NEUT#: 3.6 10*3/uL (ref 1.5–6.5)
NEUT%: 51.8 % (ref 38.4–76.8)
Platelets: 285 10*3/uL (ref 145–400)
RBC: 4.37 10*6/uL (ref 3.70–5.45)
RDW: 16.6 % — ABNORMAL HIGH (ref 11.2–14.5)
WBC: 7 10*3/uL (ref 3.9–10.3)
lymph#: 2 10*3/uL (ref 0.9–3.3)
nRBC: 0 % (ref 0–0)

## 2009-05-21 LAB — COMPREHENSIVE METABOLIC PANEL
ALT: 16 U/L (ref 0–35)
AST: 22 U/L (ref 0–37)
Albumin: 3.7 g/dL (ref 3.5–5.2)
Alkaline Phosphatase: 116 U/L (ref 39–117)
BUN: 14 mg/dL (ref 6–23)
CO2: 28 mEq/L (ref 19–32)
Calcium: 9.1 mg/dL (ref 8.4–10.5)
Chloride: 102 mEq/L (ref 96–112)
Creatinine, Ser: 1 mg/dL (ref 0.40–1.20)
Glucose, Bld: 124 mg/dL — ABNORMAL HIGH (ref 70–99)
Potassium: 3.6 mEq/L (ref 3.5–5.3)
Sodium: 141 mEq/L (ref 135–145)
Total Bilirubin: 0.2 mg/dL — ABNORMAL LOW (ref 0.3–1.2)
Total Protein: 6.3 g/dL (ref 6.0–8.3)

## 2009-05-21 LAB — LACTATE DEHYDROGENASE: LDH: 190 U/L (ref 94–250)

## 2009-06-09 ENCOUNTER — Ambulatory Visit: Payer: Self-pay | Admitting: Oncology

## 2009-06-11 LAB — CBC WITH DIFFERENTIAL/PLATELET
BASO%: 0.6 % (ref 0.0–2.0)
Basophils Absolute: 0 10*3/uL (ref 0.0–0.1)
EOS%: 5.3 % (ref 0.0–7.0)
Eosinophils Absolute: 0.4 10*3/uL (ref 0.0–0.5)
HCT: 38.4 % (ref 34.8–46.6)
HGB: 12.7 g/dL (ref 11.6–15.9)
LYMPH%: 30.5 % (ref 14.0–49.7)
MCH: 28.6 pg (ref 25.1–34.0)
MCHC: 33.1 g/dL (ref 31.5–36.0)
MCV: 86.5 fL (ref 79.5–101.0)
MONO#: 0.9 10*3/uL (ref 0.1–0.9)
MONO%: 13.2 % (ref 0.0–14.0)
NEUT#: 3.4 10*3/uL (ref 1.5–6.5)
NEUT%: 50.4 % (ref 38.4–76.8)
Platelets: 205 10*3/uL (ref 145–400)
RBC: 4.44 10*6/uL (ref 3.70–5.45)
RDW: 16.6 % — ABNORMAL HIGH (ref 11.2–14.5)
WBC: 6.7 10*3/uL (ref 3.9–10.3)
lymph#: 2 10*3/uL (ref 0.9–3.3)

## 2009-06-11 LAB — COMPREHENSIVE METABOLIC PANEL
ALT: 14 U/L (ref 0–35)
AST: 22 U/L (ref 0–37)
Albumin: 3.7 g/dL (ref 3.5–5.2)
Alkaline Phosphatase: 119 U/L — ABNORMAL HIGH (ref 39–117)
BUN: 12 mg/dL (ref 6–23)
CO2: 29 mEq/L (ref 19–32)
Calcium: 8.9 mg/dL (ref 8.4–10.5)
Chloride: 102 mEq/L (ref 96–112)
Creatinine, Ser: 1.05 mg/dL (ref 0.40–1.20)
Glucose, Bld: 113 mg/dL — ABNORMAL HIGH (ref 70–99)
Potassium: 3.4 mEq/L — ABNORMAL LOW (ref 3.5–5.3)
Sodium: 140 mEq/L (ref 135–145)
Total Bilirubin: 0.2 mg/dL — ABNORMAL LOW (ref 0.3–1.2)
Total Protein: 6.7 g/dL (ref 6.0–8.3)

## 2009-06-11 LAB — LACTATE DEHYDROGENASE: LDH: 169 U/L (ref 94–250)

## 2009-06-11 LAB — CANCER ANTIGEN 27.29: CA 27.29: 33 U/mL (ref 0–39)

## 2009-07-02 ENCOUNTER — Encounter: Payer: Self-pay | Admitting: Cardiology

## 2009-07-02 LAB — COMPREHENSIVE METABOLIC PANEL
ALT: 19 U/L (ref 0–35)
AST: 26 U/L (ref 0–37)
Albumin: 4.1 g/dL (ref 3.5–5.2)
Alkaline Phosphatase: 132 U/L — ABNORMAL HIGH (ref 39–117)
BUN: 15 mg/dL (ref 6–23)
CO2: 26 mEq/L (ref 19–32)
Calcium: 9.3 mg/dL (ref 8.4–10.5)
Chloride: 100 mEq/L (ref 96–112)
Creatinine, Ser: 0.96 mg/dL (ref 0.40–1.20)
Glucose, Bld: 105 mg/dL — ABNORMAL HIGH (ref 70–99)
Potassium: 3.7 mEq/L (ref 3.5–5.3)
Sodium: 139 mEq/L (ref 135–145)
Total Bilirubin: 0.3 mg/dL (ref 0.3–1.2)
Total Protein: 7.3 g/dL (ref 6.0–8.3)

## 2009-07-02 LAB — CBC WITH DIFFERENTIAL/PLATELET
BASO%: 0.6 % (ref 0.0–2.0)
Basophils Absolute: 0 10*3/uL (ref 0.0–0.1)
EOS%: 4.2 % (ref 0.0–7.0)
Eosinophils Absolute: 0.3 10*3/uL (ref 0.0–0.5)
HCT: 38.6 % (ref 34.8–46.6)
HGB: 13 g/dL (ref 11.6–15.9)
LYMPH%: 20.8 % (ref 14.0–49.7)
MCH: 29 pg (ref 25.1–34.0)
MCHC: 33.7 g/dL (ref 31.5–36.0)
MCV: 86.2 fL (ref 79.5–101.0)
MONO#: 0.8 10*3/uL (ref 0.1–0.9)
MONO%: 11 % (ref 0.0–14.0)
NEUT#: 4.5 10*3/uL (ref 1.5–6.5)
NEUT%: 63.4 % (ref 38.4–76.8)
Platelets: 239 10*3/uL (ref 145–400)
RBC: 4.48 10*6/uL (ref 3.70–5.45)
RDW: 17.2 % — ABNORMAL HIGH (ref 11.2–14.5)
WBC: 7.1 10*3/uL (ref 3.9–10.3)
lymph#: 1.5 10*3/uL (ref 0.9–3.3)

## 2009-07-21 ENCOUNTER — Ambulatory Visit: Payer: Self-pay | Admitting: Oncology

## 2009-07-23 LAB — COMPREHENSIVE METABOLIC PANEL
ALT: 11 U/L (ref 0–35)
AST: 17 U/L (ref 0–37)
Albumin: 3.9 g/dL (ref 3.5–5.2)
Alkaline Phosphatase: 116 U/L (ref 39–117)
BUN: 15 mg/dL (ref 6–23)
CO2: 27 mEq/L (ref 19–32)
Calcium: 8.8 mg/dL (ref 8.4–10.5)
Chloride: 103 mEq/L (ref 96–112)
Creatinine, Ser: 0.98 mg/dL (ref 0.40–1.20)
Glucose, Bld: 130 mg/dL — ABNORMAL HIGH (ref 70–99)
Potassium: 3.4 mEq/L — ABNORMAL LOW (ref 3.5–5.3)
Sodium: 140 mEq/L (ref 135–145)
Total Bilirubin: 0.3 mg/dL (ref 0.3–1.2)
Total Protein: 6.9 g/dL (ref 6.0–8.3)

## 2009-07-23 LAB — CBC WITH DIFFERENTIAL/PLATELET
BASO%: 0.9 % (ref 0.0–2.0)
Basophils Absolute: 0.1 10*3/uL (ref 0.0–0.1)
EOS%: 5.8 % (ref 0.0–7.0)
Eosinophils Absolute: 0.4 10*3/uL (ref 0.0–0.5)
HCT: 39.6 % (ref 34.8–46.6)
HGB: 13.1 g/dL (ref 11.6–15.9)
LYMPH%: 30.5 % (ref 14.0–49.7)
MCH: 28.9 pg (ref 25.1–34.0)
MCHC: 33.1 g/dL (ref 31.5–36.0)
MCV: 87.2 fL (ref 79.5–101.0)
MONO#: 0.7 10*3/uL (ref 0.1–0.9)
MONO%: 10.3 % (ref 0.0–14.0)
NEUT#: 3.6 10*3/uL (ref 1.5–6.5)
NEUT%: 52.5 % (ref 38.4–76.8)
Platelets: 250 10*3/uL (ref 145–400)
RBC: 4.54 10*6/uL (ref 3.70–5.45)
RDW: 17.6 % — ABNORMAL HIGH (ref 11.2–14.5)
WBC: 6.9 10*3/uL (ref 3.9–10.3)
lymph#: 2.1 10*3/uL (ref 0.9–3.3)

## 2009-07-31 IMAGING — PT NM PET TUM IMG RESTAG (PS) SKULL BASE T - THIGH
6 series · 25 of 25 positions shown · non-contrast
Comparison: 12/17/2008

CLINICAL DATA: Subsequent treatment strategy for breast cancer.  

NUCLEAR MEDICINE PET CT RESTAGING (PS) SKULL BASE TO THIGH
TECHNIQUE: 16.1 mCi F-18 FDG was injected intravenously via the .
Full-ring PET imaging was performed from the skull base through the
mid-thighs   minutes after injection.  CT data was obtained and
used for attenuation correction and anatomic localization only.
(This was not acquired as a diagnostic CT examination.)
Fasting Blood Glucose:

[Series 1: pet ac · axial · 3.3mm · 4.69mm/px · z∈[-726,+0]mm · 5 of 223 slices shown]
[im 1/223]
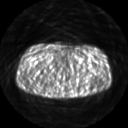
[im 56/223]
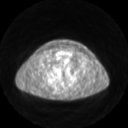
[im 112/223]
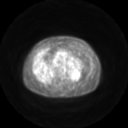
[im 167/223]
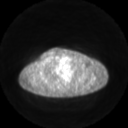
[im 223/223]
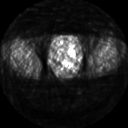

[Series 2: pet nac · axial · 3.3mm · 4.69mm/px · z∈[-726,+0]mm · 6 of 223 slices shown]
[im 1/223]
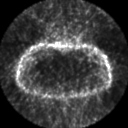
[im 45/223]
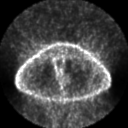
[im 89/223]
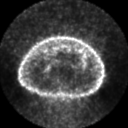
[im 134/223]
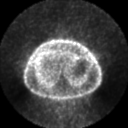
[im 178/223]
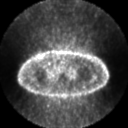
[im 223/223]
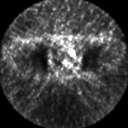

[Series 2: ct images · axial · 3.8mm · 0.98mm/px · z∈[-726,+0]mm · 5 of 223 slices shown]
[im 1/223]
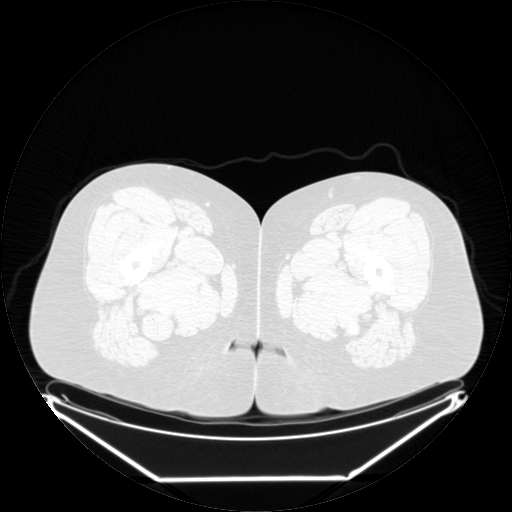
[im 56/223]
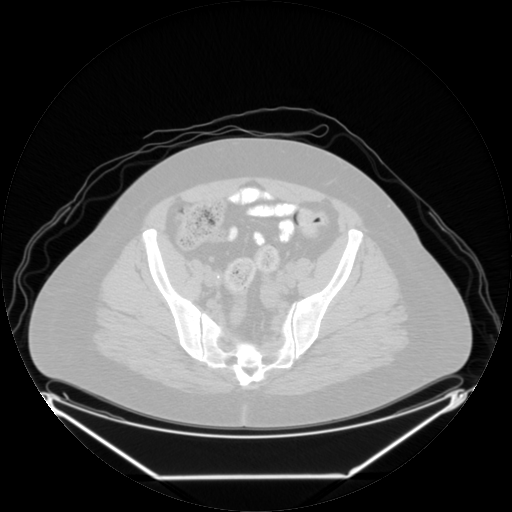
[im 112/223]
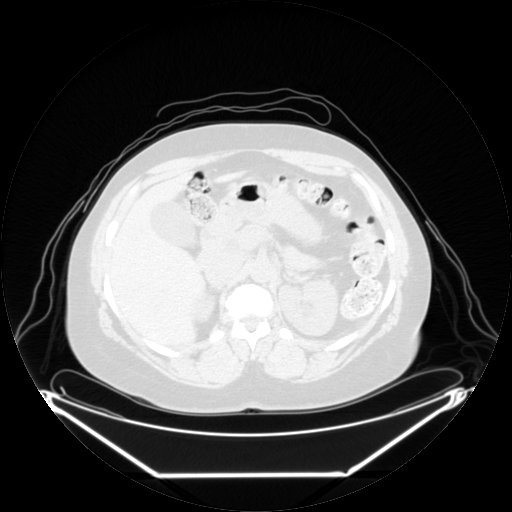
[im 167/223]
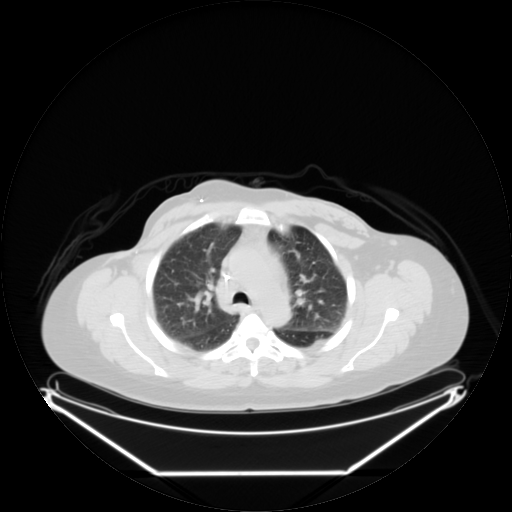
[im 223/223]
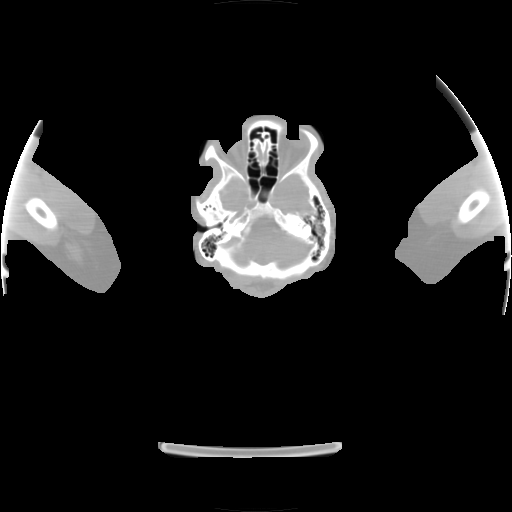

[Series 123: mip · coronal · 3.3mm · 4.69mm/px · 1 of 30 slices shown]
[im 1/30]
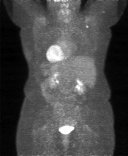

[Series 151: reformatted · axial · 3.3mm · 3.91mm/px · z∈[-726,+0]mm · 6 of 223 slices shown (1 of 2)]
[im 1/223]
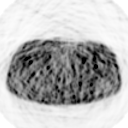
[im 45/223]
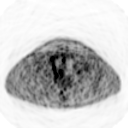
[im 89/223]
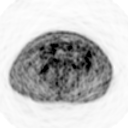
[im 134/223]
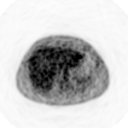
[im 178/223]
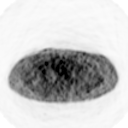
[im 223/223]
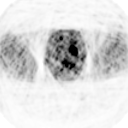

[Series 153: reformatted · coronal · 4.7mm · 5.83mm/px · 2 of 72 slices shown (2 of 2)]
[im 1/72]
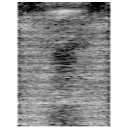
[im 72/72]
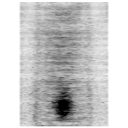

[25 of 25 positions shown; findings below may reference images not displayed]

FINDINGS: The right subpectoral lymph node demonstrates abnormal
FDG uptake.  This was 5.0 SUV on the prior study and is now 5.9.
The small left axillary lymph node is weakly positive with SUV max
of 1.3.  There is a small supraclavicular node on the right side,
just medial to the pectoralis minor muscle, which is also weekly
positive with SUV max of 2.6.

Vague nodularity and increasing soft tissue density in the anterior
mediastinum in the region of the thymic gland is positive on the
PET scan. This may be due to thymic rebound from steroids or
chemotherapy.  Tumor cannot be totally excluded.  Attention to this
area on future studies is suggested.

FDG uptake is noted in the right ventricular wall.

No areas of worrisome uptake in the abdomen pelvis.  The small
scattered nodes not showing definite abnormal FDG uptake.  The
enlarging inguinal lymph nodes do show weak FDG uptake with SUV max
of 2.0 in the largest right inguinal node.  This may be
inflammatory change or tumor.
IMPRESSION: 1.  Slight increased FDG uptake in the right subpectoral lymph node
which is also larger than on the prior study.
2.  New thymic region activity may be related to thymic rebound.
Attention to this area on future scans is suggested.
3.  Enlarging inguinal lymph nodes showing weak FDG uptake.
Inflammatory change versus tumor.

## 2009-07-31 IMAGING — CT CT PELVIS W/ CM
1 of 3 series · 12 of 32 positions shown, 17 images · IV contrast (agent unspecified)
Comparison: 12/17/2008

CT CHEST

CLINICAL DATA: Breast cancer.

CT CHEST, ABDOMEN AND PELVIS WITH CONTRAST
TECHNIQUE: Multidetector CT imaging of the chest, abdomen and
pelvis was performed following the standard protocol during bolus
administration of intravenous contrast.
Contrast: 100 ml of Pmnipaque-OOO

[Series 2: cap with st · axial · 0.81mm/px · z∈[+396,+896]mm · 12 of 114 slices shown, 17 images]
[im 7/114  soft-tissue]
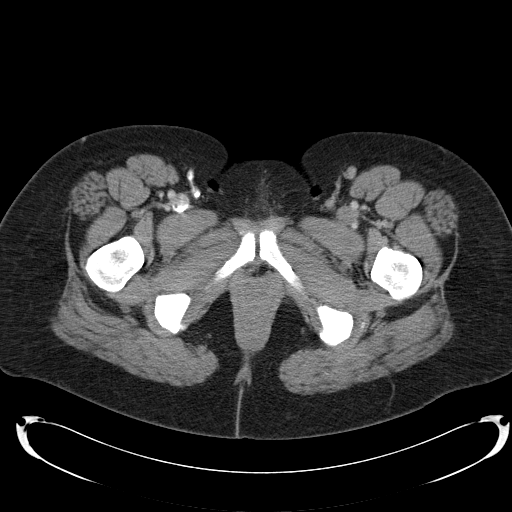
[im 7/114  bone]
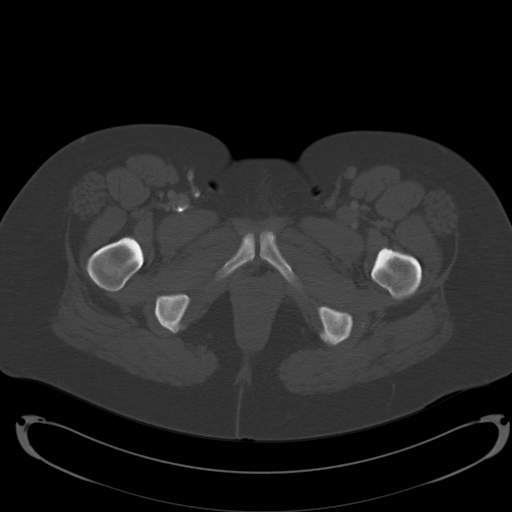
[im 19/114  soft-tissue]
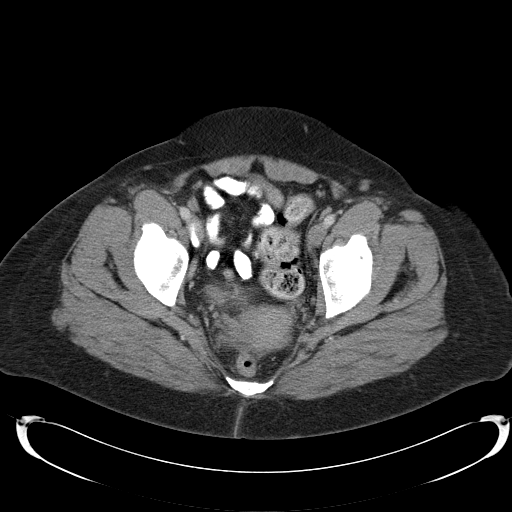
[im 26/114  soft-tissue]
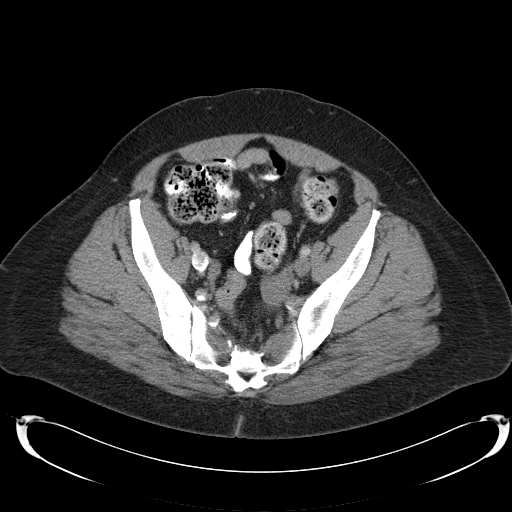
[im 38/114  soft-tissue]
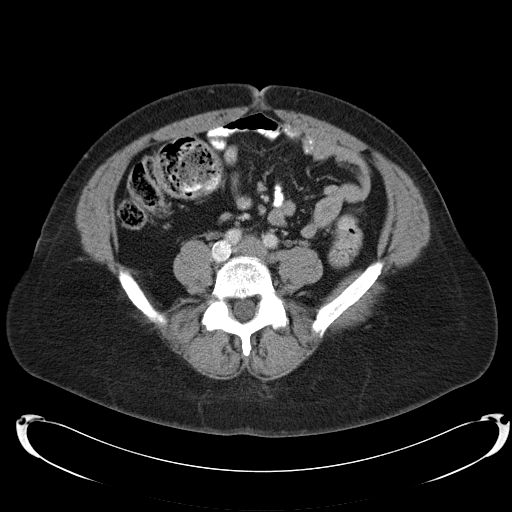
[im 44/114  soft-tissue]
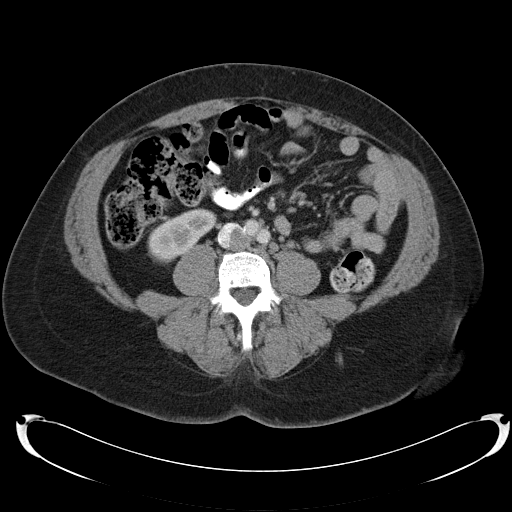
[im 57/114  soft-tissue]
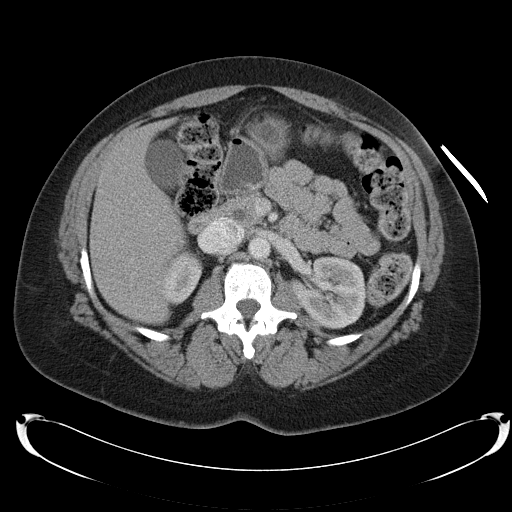
[im 70/114  soft-tissue]
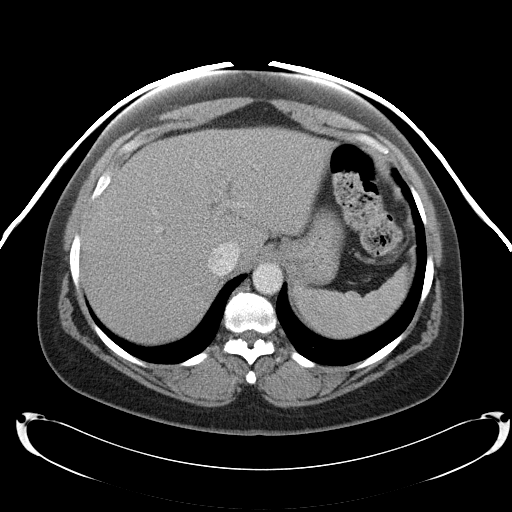
[im 76/114  soft-tissue]
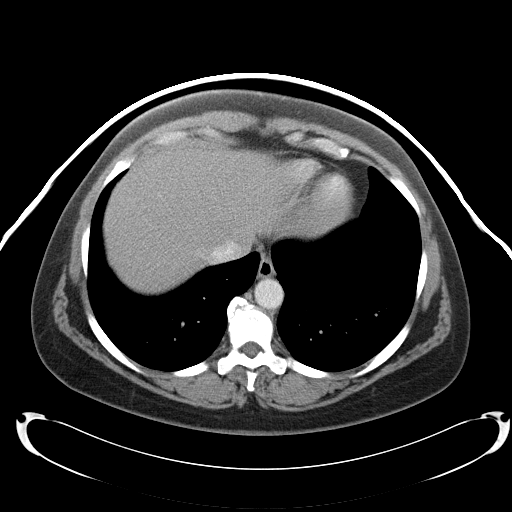
[im 88/114  soft-tissue]
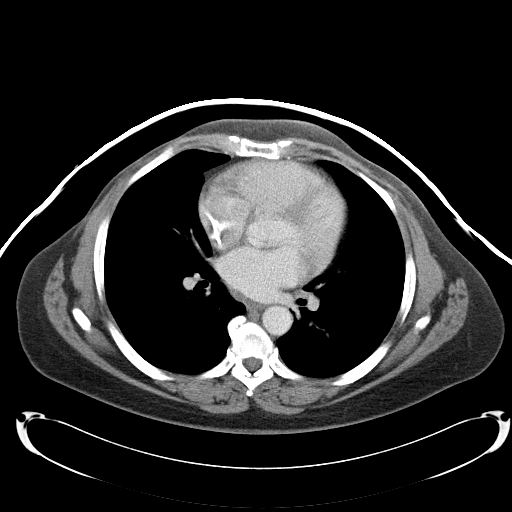
[im 88/114  lung]
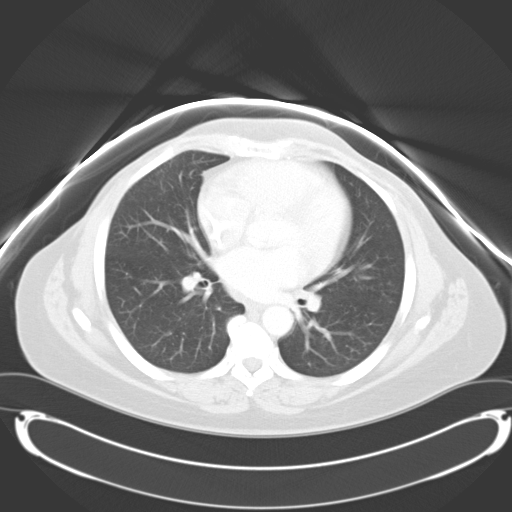
[im 88/114  bone]
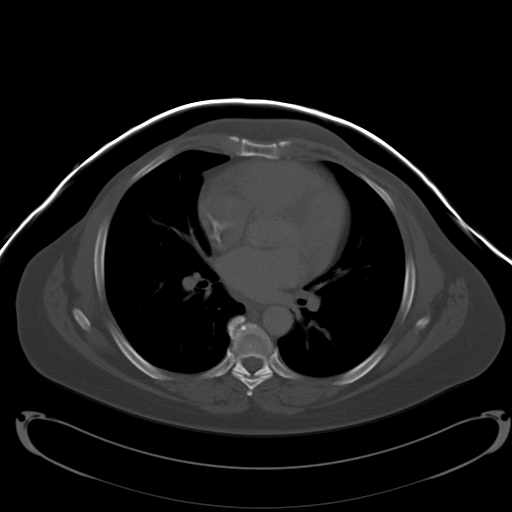
[im 95/114  soft-tissue]
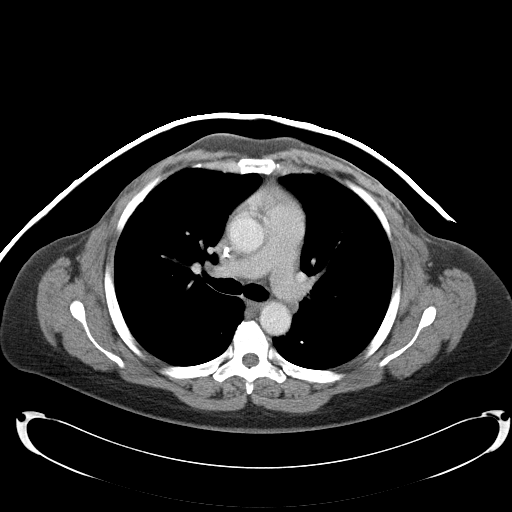
[im 95/114  lung]
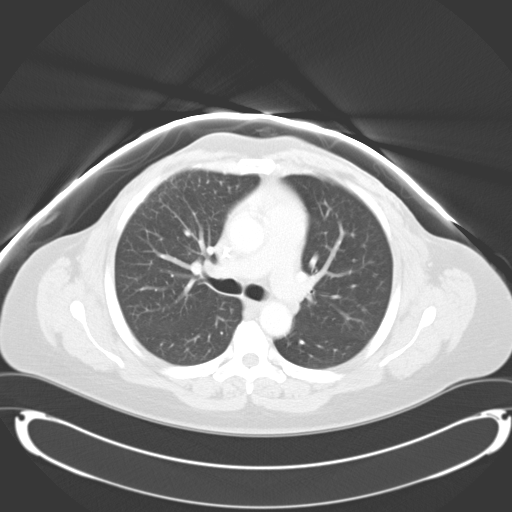
[im 101/114  lung]
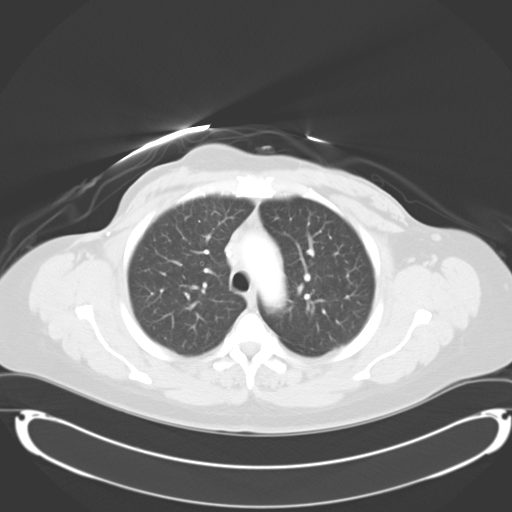
[im 107/114  soft-tissue]
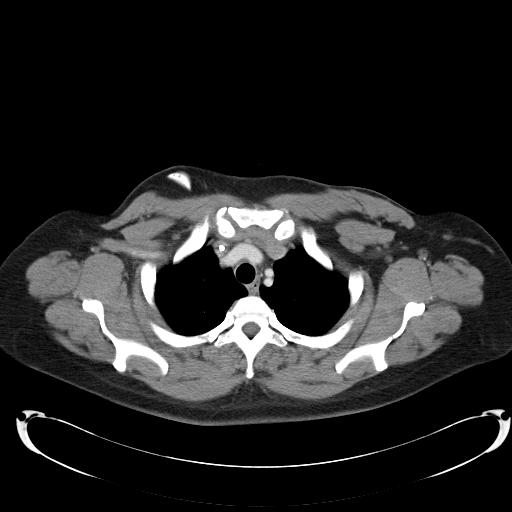
[im 107/114  lung]
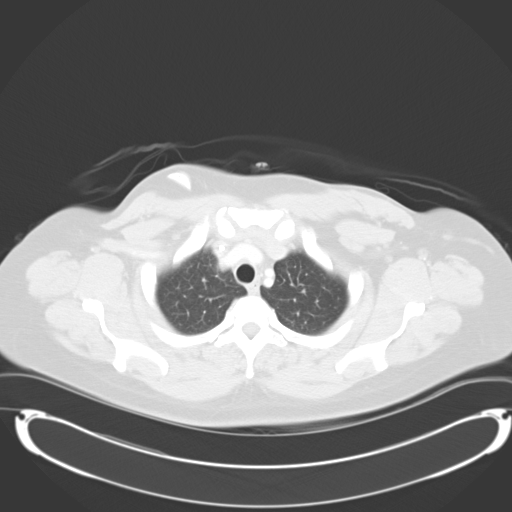

[12 of 32 positions shown; findings below may reference images not displayed]

FINDINGS: The right-sided Port-A-Cath is stable.  The subpectoral
lymph node on image number 31 has increased slightly in size.  On
the prior study it measured 16 x 9 mm and now measures 17 x 13 mm.
There also shows increased FDG uptake on the PET scan consistent
with a metastatic node.  Small axillary lymph nodes on the left
side are stable.  Stable surgical change in the right axilla.  The
patient has had bilateral mastectomies.

The heart is normal in size.  No mediastinal or hilar adenopathy.
The aorta is normal in caliber.  The esophagus is grossly normal.

Examination of the lung parenchyma demonstrates stable radiation
changes in the right anterior along.  There is slight increase in
size of the right lower lobe pulmonary nodule on image number 31.
It measures 5.2 mm and previously measured 4.1 mm.  The left lower
lobe nodule seen on the prior study has a more linear configuration
and is likely a small area of scar.  This is unchanged.  No new
pulmonary nodules are seen.  No pleural effusions.  No acute
pulmonary findings.

The bony structures appear stable.  No definite findings for bony
metastatic disease.  No spinal canal compromise.
IMPRESSION: 1.  Slight interval increase in size of the right subpectoral lymph
node which also shows increasing FDG uptake on the PET scan and is
consistent with a metastatic lymph node.
2.  Small stable left axillary and supraclavicular nodes.
3.  No mediastinal or hilar adenopathy.
4.  Stable radiation changes in the lungs.
5.  Very slight increase in size of the right lower lobe pulmonary
nodule.  No new lung nodules are seen.

CT ABDOMEN
FINDINGS: There is fatty infiltration of the liver.  No focal
hepatic lesions.  The small lesion is seen in the left lobe on the
previous study is not visualized.  The gallbladder appears normal.
Stable mild dilatation of the common bile duct in the porta hepatis
and pancreatic head.  The spleen is normal in size.  The pancreas
demonstrates no abnormalities.  The pancreatic duct is normal in
caliber.  The adrenal glands and kidneys demonstrate no significant
abnormalities.

The stomach, duodenum, small bowel and colon are unremarkable.
There is a moderate amount of stool noted throughout the colon.

There are stable gastrohepatic ligament, mesenteric and
retroperitoneal lymph nodes.

No significant bony findings.
IMPRESSION: No CT findings for metastatic disease involving the abdomen.  There
are stable abdominal lymph nodes.  Stable mild common bile duct
dilatation.

CT PELVIS
FINDINGS: The rectum, sigmoid colon and visualized small bowel
loops are unremarkable.  There is mild diffuse bladder wall
thickening and mild edema like changes in the wall.  Findings may
suggest cystitis.  Recommend clinical correlation.  Uterus and
ovaries are unremarkable and stable.  No pelvic mass, adenopathy or
free pelvic fluid collection.  Slight interval enlargement of
inguinal lymph nodes.  The dominant right inguinal node measured 18
x 13 mm on the previous study and now measures 24 x 17 mm.  These
are weakly positive on the PET scanned.

The bony structures are unremarkable and stable.
IMPRESSION: 1.  Enlarging bilateral inguinal lymph nodes which are weakly
positive on the PET scan and worrisome for malignancy.
2.  Diffuse irregular bladder wall thickening may suggest cystitis.
Recommend clinical correlation.
3.  Stable small intrapelvic lymph nodes.

## 2009-08-10 ENCOUNTER — Ambulatory Visit: Payer: Self-pay | Admitting: Cardiovascular Disease

## 2009-08-10 ENCOUNTER — Encounter: Payer: Self-pay | Admitting: Oncology

## 2009-08-10 ENCOUNTER — Ambulatory Visit (HOSPITAL_COMMUNITY): Admission: RE | Admit: 2009-08-10 | Discharge: 2009-08-10 | Payer: Self-pay | Admitting: Cardiovascular Disease

## 2009-08-10 ENCOUNTER — Ambulatory Visit: Payer: Self-pay

## 2009-08-10 ENCOUNTER — Encounter: Payer: Self-pay | Admitting: Cardiology

## 2009-08-12 ENCOUNTER — Encounter: Payer: Self-pay | Admitting: Cardiology

## 2009-08-12 LAB — CBC WITH DIFFERENTIAL/PLATELET
BASO%: 0.7 % (ref 0.0–2.0)
Basophils Absolute: 0.1 10*3/uL (ref 0.0–0.1)
EOS%: 4.3 % (ref 0.0–7.0)
Eosinophils Absolute: 0.3 10*3/uL (ref 0.0–0.5)
HCT: 40.2 % (ref 34.8–46.6)
HGB: 13.2 g/dL (ref 11.6–15.9)
LYMPH%: 36.9 % (ref 14.0–49.7)
MCH: 29.1 pg (ref 25.1–34.0)
MCHC: 32.8 g/dL (ref 31.5–36.0)
MCV: 88.7 fL (ref 79.5–101.0)
MONO#: 0.6 10*3/uL (ref 0.1–0.9)
MONO%: 8.8 % (ref 0.0–14.0)
NEUT#: 3.5 10*3/uL (ref 1.5–6.5)
NEUT%: 49.3 % (ref 38.4–76.8)
Platelets: 248 10*3/uL (ref 145–400)
RBC: 4.53 10*6/uL (ref 3.70–5.45)
RDW: 17.7 % — ABNORMAL HIGH (ref 11.2–14.5)
WBC: 7.1 10*3/uL (ref 3.9–10.3)
lymph#: 2.6 10*3/uL (ref 0.9–3.3)

## 2009-08-26 ENCOUNTER — Encounter: Payer: Self-pay | Admitting: Cardiology

## 2009-08-26 ENCOUNTER — Ambulatory Visit (HOSPITAL_COMMUNITY): Admission: RE | Admit: 2009-08-26 | Discharge: 2009-08-26 | Payer: Self-pay | Admitting: Oncology

## 2009-08-28 ENCOUNTER — Ambulatory Visit: Payer: Self-pay | Admitting: Oncology

## 2009-09-02 ENCOUNTER — Ambulatory Visit: Payer: Self-pay | Admitting: Cardiology

## 2009-09-02 DIAGNOSIS — I429 Cardiomyopathy, unspecified: Secondary | ICD-10-CM | POA: Insufficient documentation

## 2009-09-02 LAB — CBC WITH DIFFERENTIAL/PLATELET
BASO%: 0.8 % (ref 0.0–2.0)
Basophils Absolute: 0.1 10*3/uL (ref 0.0–0.1)
EOS%: 2.9 % (ref 0.0–7.0)
Eosinophils Absolute: 0.2 10*3/uL (ref 0.0–0.5)
HCT: 41 % (ref 34.8–46.6)
HGB: 13.7 g/dL (ref 11.6–15.9)
LYMPH%: 28.4 % (ref 14.0–49.7)
MCH: 29.3 pg (ref 25.1–34.0)
MCHC: 33.4 g/dL (ref 31.5–36.0)
MCV: 87.6 fL (ref 79.5–101.0)
MONO#: 0.6 10*3/uL (ref 0.1–0.9)
MONO%: 7.6 % (ref 0.0–14.0)
NEUT#: 4.3 10*3/uL (ref 1.5–6.5)
NEUT%: 60.3 % (ref 38.4–76.8)
Platelets: 239 10*3/uL (ref 145–400)
RBC: 4.68 10*6/uL (ref 3.70–5.45)
RDW: 16.6 % — ABNORMAL HIGH (ref 11.2–14.5)
WBC: 7.2 10*3/uL (ref 3.9–10.3)
lymph#: 2 10*3/uL (ref 0.9–3.3)
nRBC: 0 % (ref 0–0)

## 2009-09-05 ENCOUNTER — Emergency Department (HOSPITAL_COMMUNITY): Admission: EM | Admit: 2009-09-05 | Discharge: 2009-09-05 | Payer: Self-pay | Admitting: Family Medicine

## 2009-09-09 ENCOUNTER — Telehealth: Payer: Self-pay | Admitting: Cardiology

## 2009-09-10 ENCOUNTER — Telehealth: Payer: Self-pay | Admitting: Cardiology

## 2009-09-10 DIAGNOSIS — R079 Chest pain, unspecified: Secondary | ICD-10-CM | POA: Insufficient documentation

## 2009-09-11 ENCOUNTER — Encounter (INDEPENDENT_AMBULATORY_CARE_PROVIDER_SITE_OTHER): Payer: Self-pay | Admitting: *Deleted

## 2009-09-11 ENCOUNTER — Telehealth: Payer: Self-pay | Admitting: Cardiology

## 2009-09-16 ENCOUNTER — Telehealth: Payer: Self-pay | Admitting: Cardiology

## 2009-09-22 ENCOUNTER — Ambulatory Visit: Payer: Self-pay | Admitting: Cardiology

## 2009-09-30 ENCOUNTER — Emergency Department (HOSPITAL_COMMUNITY): Admission: EM | Admit: 2009-09-30 | Discharge: 2009-09-30 | Payer: Self-pay | Admitting: Emergency Medicine

## 2009-10-06 ENCOUNTER — Ambulatory Visit: Payer: Self-pay | Admitting: Oncology

## 2009-10-08 LAB — COMPREHENSIVE METABOLIC PANEL
ALT: 19 U/L (ref 0–35)
AST: 25 U/L (ref 0–37)
Albumin: 4 g/dL (ref 3.5–5.2)
Alkaline Phosphatase: 88 U/L (ref 39–117)
BUN: 11 mg/dL (ref 6–23)
CO2: 30 mEq/L (ref 19–32)
Calcium: 9.6 mg/dL (ref 8.4–10.5)
Chloride: 104 mEq/L (ref 96–112)
Creatinine, Ser: 0.78 mg/dL (ref 0.40–1.20)
Glucose, Bld: 97 mg/dL (ref 70–99)
Potassium: 3.7 mEq/L (ref 3.5–5.3)
Sodium: 139 mEq/L (ref 135–145)
Total Bilirubin: 0.4 mg/dL (ref 0.3–1.2)
Total Protein: 7.5 g/dL (ref 6.0–8.3)

## 2009-10-08 LAB — CBC WITH DIFFERENTIAL/PLATELET
BASO%: 0.5 % (ref 0.0–2.0)
Basophils Absolute: 0 10*3/uL (ref 0.0–0.1)
EOS%: 2 % (ref 0.0–7.0)
Eosinophils Absolute: 0.1 10*3/uL (ref 0.0–0.5)
HCT: 40.2 % (ref 34.8–46.6)
HGB: 13.4 g/dL (ref 11.6–15.9)
LYMPH%: 31.3 % (ref 14.0–49.7)
MCH: 29.3 pg (ref 25.1–34.0)
MCHC: 33.3 g/dL (ref 31.5–36.0)
MCV: 88 fL (ref 79.5–101.0)
MONO#: 0.4 10*3/uL (ref 0.1–0.9)
MONO%: 6.3 % (ref 0.0–14.0)
NEUT#: 3.5 10*3/uL (ref 1.5–6.5)
NEUT%: 59.9 % (ref 38.4–76.8)
Platelets: 231 10*3/uL (ref 145–400)
RBC: 4.57 10*6/uL (ref 3.70–5.45)
RDW: 14.9 % — ABNORMAL HIGH (ref 11.2–14.5)
WBC: 5.9 10*3/uL (ref 3.9–10.3)
lymph#: 1.9 10*3/uL (ref 0.9–3.3)

## 2009-10-08 LAB — LACTATE DEHYDROGENASE: LDH: 158 U/L (ref 94–250)

## 2009-10-08 LAB — CANCER ANTIGEN 27.29: CA 27.29: 36 U/mL (ref 0–39)

## 2009-10-13 ENCOUNTER — Ambulatory Visit: Payer: Self-pay | Admitting: Cardiology

## 2009-10-13 ENCOUNTER — Ambulatory Visit: Payer: Self-pay

## 2009-10-13 ENCOUNTER — Encounter: Payer: Self-pay | Admitting: Cardiology

## 2009-10-13 ENCOUNTER — Ambulatory Visit (HOSPITAL_COMMUNITY): Admission: RE | Admit: 2009-10-13 | Discharge: 2009-10-13 | Payer: Self-pay | Admitting: Cardiology

## 2009-10-13 LAB — CELL SEARCH FOR BREAST CANCER

## 2009-10-14 ENCOUNTER — Telehealth: Payer: Self-pay | Admitting: Cardiology

## 2009-11-09 ENCOUNTER — Ambulatory Visit: Payer: Self-pay | Admitting: Oncology

## 2009-11-12 ENCOUNTER — Encounter: Payer: Self-pay | Admitting: Cardiology

## 2009-11-12 LAB — CBC WITH DIFFERENTIAL/PLATELET
BASO%: 0.6 % (ref 0.0–2.0)
Basophils Absolute: 0 10*3/uL (ref 0.0–0.1)
EOS%: 3.7 % (ref 0.0–7.0)
Eosinophils Absolute: 0.3 10*3/uL (ref 0.0–0.5)
HCT: 38.4 % (ref 34.8–46.6)
HGB: 12.4 g/dL (ref 11.6–15.9)
LYMPH%: 34.3 % (ref 14.0–49.7)
MCH: 28.7 pg (ref 25.1–34.0)
MCHC: 32.3 g/dL (ref 31.5–36.0)
MCV: 88.9 fL (ref 79.5–101.0)
MONO#: 0.7 10*3/uL (ref 0.1–0.9)
MONO%: 9.6 % (ref 0.0–14.0)
NEUT#: 3.6 10*3/uL (ref 1.5–6.5)
NEUT%: 51.8 % (ref 38.4–76.8)
Platelets: 278 10*3/uL (ref 145–400)
RBC: 4.32 10*6/uL (ref 3.70–5.45)
RDW: 13.9 % (ref 11.2–14.5)
WBC: 7 10*3/uL (ref 3.9–10.3)
lymph#: 2.4 10*3/uL (ref 0.9–3.3)
nRBC: 0 % (ref 0–0)

## 2009-11-12 LAB — BASIC METABOLIC PANEL
BUN: 7 mg/dL (ref 6–23)
CO2: 32 mEq/L (ref 19–32)
Calcium: 9.3 mg/dL (ref 8.4–10.5)
Chloride: 100 mEq/L (ref 96–112)
Creatinine, Ser: 0.8 mg/dL (ref 0.40–1.20)
Glucose, Bld: 89 mg/dL (ref 70–99)
Potassium: 3.5 mEq/L (ref 3.5–5.3)
Sodium: 139 mEq/L (ref 135–145)

## 2009-11-25 ENCOUNTER — Encounter: Payer: Self-pay | Admitting: Cardiology

## 2009-11-25 LAB — CBC WITH DIFFERENTIAL/PLATELET
BASO%: 1.3 % (ref 0.0–2.0)
Basophils Absolute: 0.1 10*3/uL (ref 0.0–0.1)
EOS%: 3.9 % (ref 0.0–7.0)
Eosinophils Absolute: 0.3 10*3/uL (ref 0.0–0.5)
HCT: 38.2 % (ref 34.8–46.6)
HGB: 12.7 g/dL (ref 11.6–15.9)
LYMPH%: 37.4 % (ref 14.0–49.7)
MCH: 28.5 pg (ref 25.1–34.0)
MCHC: 33.2 g/dL (ref 31.5–36.0)
MCV: 85.7 fL (ref 79.5–101.0)
MONO#: 0.5 10*3/uL (ref 0.1–0.9)
MONO%: 7.6 % (ref 0.0–14.0)
NEUT#: 3.4 10*3/uL (ref 1.5–6.5)
NEUT%: 49.8 % (ref 38.4–76.8)
Platelets: 344 10*3/uL (ref 145–400)
RBC: 4.46 10*6/uL (ref 3.70–5.45)
RDW: 13.2 % (ref 11.2–14.5)
WBC: 6.8 10*3/uL (ref 3.9–10.3)
lymph#: 2.6 10*3/uL (ref 0.9–3.3)
nRBC: 0 % (ref 0–0)

## 2009-11-25 LAB — BASIC METABOLIC PANEL
BUN: 17 mg/dL (ref 6–23)
CO2: 24 mEq/L (ref 19–32)
Calcium: 8.5 mg/dL (ref 8.4–10.5)
Chloride: 102 mEq/L (ref 96–112)
Creatinine, Ser: 0.86 mg/dL (ref 0.40–1.20)
Glucose, Bld: 98 mg/dL (ref 70–99)
Potassium: 3.5 mEq/L (ref 3.5–5.3)
Sodium: 138 mEq/L (ref 135–145)

## 2009-12-02 LAB — CBC WITH DIFFERENTIAL/PLATELET
BASO%: 0.6 % (ref 0.0–2.0)
Basophils Absolute: 0 10*3/uL (ref 0.0–0.1)
EOS%: 2 % (ref 0.0–7.0)
Eosinophils Absolute: 0.1 10*3/uL (ref 0.0–0.5)
HCT: 36.5 % (ref 34.8–46.6)
HGB: 11.9 g/dL (ref 11.6–15.9)
LYMPH%: 23.4 % (ref 14.0–49.7)
MCH: 28.5 pg (ref 25.1–34.0)
MCHC: 32.6 g/dL (ref 31.5–36.0)
MCV: 87.3 fL (ref 79.5–101.0)
MONO#: 0.5 10*3/uL (ref 0.1–0.9)
MONO%: 7.9 % (ref 0.0–14.0)
NEUT#: 4.5 10*3/uL (ref 1.5–6.5)
NEUT%: 66.1 % (ref 38.4–76.8)
Platelets: 281 10*3/uL (ref 145–400)
RBC: 4.18 10*6/uL (ref 3.70–5.45)
RDW: 14 % (ref 11.2–14.5)
WBC: 6.9 10*3/uL (ref 3.9–10.3)
lymph#: 1.6 10*3/uL (ref 0.9–3.3)
nRBC: 0 % (ref 0–0)

## 2009-12-09 ENCOUNTER — Ambulatory Visit: Payer: Self-pay | Admitting: Oncology

## 2009-12-09 LAB — CBC WITH DIFFERENTIAL/PLATELET
BASO%: 0.7 % (ref 0.0–2.0)
Basophils Absolute: 0.1 10*3/uL (ref 0.0–0.1)
EOS%: 1.9 % (ref 0.0–7.0)
Eosinophils Absolute: 0.2 10*3/uL (ref 0.0–0.5)
HCT: 40.2 % (ref 34.8–46.6)
HGB: 13.2 g/dL (ref 11.6–15.9)
LYMPH%: 26.4 % (ref 14.0–49.7)
MCH: 28.4 pg (ref 25.1–34.0)
MCHC: 32.8 g/dL (ref 31.5–36.0)
MCV: 86.6 fL (ref 79.5–101.0)
MONO#: 0.5 10*3/uL (ref 0.1–0.9)
MONO%: 6 % (ref 0.0–14.0)
NEUT#: 5.3 10*3/uL (ref 1.5–6.5)
NEUT%: 65 % (ref 38.4–76.8)
Platelets: 339 10*3/uL (ref 145–400)
RBC: 4.64 10*6/uL (ref 3.70–5.45)
RDW: 14.7 % — ABNORMAL HIGH (ref 11.2–14.5)
WBC: 8.2 10*3/uL (ref 3.9–10.3)
lymph#: 2.2 10*3/uL (ref 0.9–3.3)

## 2009-12-11 ENCOUNTER — Emergency Department (HOSPITAL_COMMUNITY): Admission: EM | Admit: 2009-12-11 | Discharge: 2009-12-11 | Payer: Self-pay | Admitting: Family Medicine

## 2009-12-11 ENCOUNTER — Telehealth: Payer: Self-pay | Admitting: Cardiology

## 2009-12-16 LAB — CBC WITH DIFFERENTIAL/PLATELET
BASO%: 0.6 % (ref 0.0–2.0)
Basophils Absolute: 0.1 10*3/uL (ref 0.0–0.1)
EOS%: 2.2 % (ref 0.0–7.0)
Eosinophils Absolute: 0.2 10*3/uL (ref 0.0–0.5)
HCT: 37.9 % (ref 34.8–46.6)
HGB: 13 g/dL (ref 11.6–15.9)
LYMPH%: 24.2 % (ref 14.0–49.7)
MCH: 29.9 pg (ref 25.1–34.0)
MCHC: 34.2 g/dL (ref 31.5–36.0)
MCV: 87.4 fL (ref 79.5–101.0)
MONO#: 1 10*3/uL — ABNORMAL HIGH (ref 0.1–0.9)
MONO%: 12.1 % (ref 0.0–14.0)
NEUT#: 5.3 10*3/uL (ref 1.5–6.5)
NEUT%: 60.9 % (ref 38.4–76.8)
Platelets: 305 10*3/uL (ref 145–400)
RBC: 4.34 10*6/uL (ref 3.70–5.45)
RDW: 15 % — ABNORMAL HIGH (ref 11.2–14.5)
WBC: 8.7 10*3/uL (ref 3.9–10.3)
lymph#: 2.1 10*3/uL (ref 0.9–3.3)

## 2009-12-16 LAB — COMPREHENSIVE METABOLIC PANEL
ALT: 9 U/L (ref 0–35)
AST: 14 U/L (ref 0–37)
Albumin: 4 g/dL (ref 3.5–5.2)
Alkaline Phosphatase: 81 U/L (ref 39–117)
BUN: 13 mg/dL (ref 6–23)
CO2: 27 mEq/L (ref 19–32)
Calcium: 9.4 mg/dL (ref 8.4–10.5)
Chloride: 102 mEq/L (ref 96–112)
Creatinine, Ser: 0.87 mg/dL (ref 0.40–1.20)
Glucose, Bld: 123 mg/dL — ABNORMAL HIGH (ref 70–99)
Potassium: 3.6 mEq/L (ref 3.5–5.3)
Sodium: 140 mEq/L (ref 135–145)
Total Bilirubin: 0.2 mg/dL — ABNORMAL LOW (ref 0.3–1.2)
Total Protein: 6.5 g/dL (ref 6.0–8.3)

## 2009-12-23 LAB — CBC WITH DIFFERENTIAL/PLATELET
BASO%: 0.4 % (ref 0.0–2.0)
Basophils Absolute: 0 10*3/uL (ref 0.0–0.1)
EOS%: 0.9 % (ref 0.0–7.0)
Eosinophils Absolute: 0.1 10*3/uL (ref 0.0–0.5)
HCT: 40 % (ref 34.8–46.6)
HGB: 13.2 g/dL (ref 11.6–15.9)
LYMPH%: 22.9 % (ref 14.0–49.7)
MCH: 28.1 pg (ref 25.1–34.0)
MCHC: 33 g/dL (ref 31.5–36.0)
MCV: 85.3 fL (ref 79.5–101.0)
MONO#: 0.8 10*3/uL (ref 0.1–0.9)
MONO%: 10.4 % (ref 0.0–14.0)
NEUT#: 5 10*3/uL (ref 1.5–6.5)
NEUT%: 65.4 % (ref 38.4–76.8)
Platelets: 263 10*3/uL (ref 145–400)
RBC: 4.69 10*6/uL (ref 3.70–5.45)
RDW: 15.2 % — ABNORMAL HIGH (ref 11.2–14.5)
WBC: 7.7 10*3/uL (ref 3.9–10.3)
lymph#: 1.8 10*3/uL (ref 0.9–3.3)

## 2009-12-23 LAB — BASIC METABOLIC PANEL
BUN: 12 mg/dL (ref 6–23)
CO2: 25 mEq/L (ref 19–32)
Calcium: 9.5 mg/dL (ref 8.4–10.5)
Chloride: 106 mEq/L (ref 96–112)
Creatinine, Ser: 0.89 mg/dL (ref 0.40–1.20)
Glucose, Bld: 83 mg/dL (ref 70–99)
Potassium: 4.1 mEq/L (ref 3.5–5.3)
Sodium: 141 mEq/L (ref 135–145)

## 2009-12-30 LAB — CBC WITH DIFFERENTIAL/PLATELET
BASO%: 0.8 % (ref 0.0–2.0)
Basophils Absolute: 0.1 10*3/uL (ref 0.0–0.1)
EOS%: 4.4 % (ref 0.0–7.0)
Eosinophils Absolute: 0.3 10*3/uL (ref 0.0–0.5)
HCT: 37.3 % (ref 34.8–46.6)
HGB: 12.2 g/dL (ref 11.6–15.9)
LYMPH%: 31.5 % (ref 14.0–49.7)
MCH: 28.2 pg (ref 25.1–34.0)
MCHC: 32.7 g/dL (ref 31.5–36.0)
MCV: 86.1 fL (ref 79.5–101.0)
MONO#: 0.5 10*3/uL (ref 0.1–0.9)
MONO%: 6.6 % (ref 0.0–14.0)
NEUT#: 4 10*3/uL (ref 1.5–6.5)
NEUT%: 56.7 % (ref 38.4–76.8)
Platelets: 329 10*3/uL (ref 145–400)
RBC: 4.33 10*6/uL (ref 3.70–5.45)
RDW: 15.1 % — ABNORMAL HIGH (ref 11.2–14.5)
WBC: 7.1 10*3/uL (ref 3.9–10.3)
lymph#: 2.2 10*3/uL (ref 0.9–3.3)
nRBC: 0 % (ref 0–0)

## 2010-01-06 LAB — CBC WITH DIFFERENTIAL/PLATELET
BASO%: 0.6 % (ref 0.0–2.0)
Basophils Absolute: 0 10*3/uL (ref 0.0–0.1)
EOS%: 2.6 % (ref 0.0–7.0)
Eosinophils Absolute: 0.2 10*3/uL (ref 0.0–0.5)
HCT: 38.9 % (ref 34.8–46.6)
HGB: 12.9 g/dL (ref 11.6–15.9)
LYMPH%: 25.1 % (ref 14.0–49.7)
MCH: 28.4 pg (ref 25.1–34.0)
MCHC: 33.2 g/dL (ref 31.5–36.0)
MCV: 85.5 fL (ref 79.5–101.0)
MONO#: 0.4 10*3/uL (ref 0.1–0.9)
MONO%: 6.5 % (ref 0.0–14.0)
NEUT#: 4.4 10*3/uL (ref 1.5–6.5)
NEUT%: 65.2 % (ref 38.4–76.8)
Platelets: 338 10*3/uL (ref 145–400)
RBC: 4.55 10*6/uL (ref 3.70–5.45)
RDW: 15.6 % — ABNORMAL HIGH (ref 11.2–14.5)
WBC: 6.8 10*3/uL (ref 3.9–10.3)
lymph#: 1.7 10*3/uL (ref 0.9–3.3)

## 2010-01-09 IMAGING — CT CT CHEST W/ CM
2 of 5 series · 16 of 46 positions shown, 18 images · IV contrast (agent unspecified)
Comparison: CT 03/17/2009, PET CT 08/26/2009

CT CHEST

CLINICAL DATA: Restaging breast cancer.  Chemotherapy and
radiation therapy completed.  Oral chemotherapy in progress.

CT CHEST, ABDOMEN AND PELVIS WITH CONTRAST
TECHNIQUE: Multidetector CT imaging of the chest, abdomen and
pelvis was performed following the standard protocol during bolus
administration of intravenous contrast.
Contrast: 100 mlOmnipaque 300

[Series 2: cap with st · axial · 0.82mm/px · z∈[-540,-40]mm · 13 of 114 slices shown, 15 images]
[im 7/114  soft-tissue]
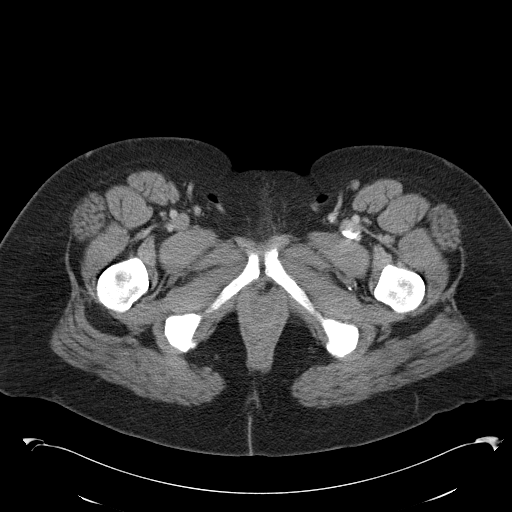
[im 7/114  bone]
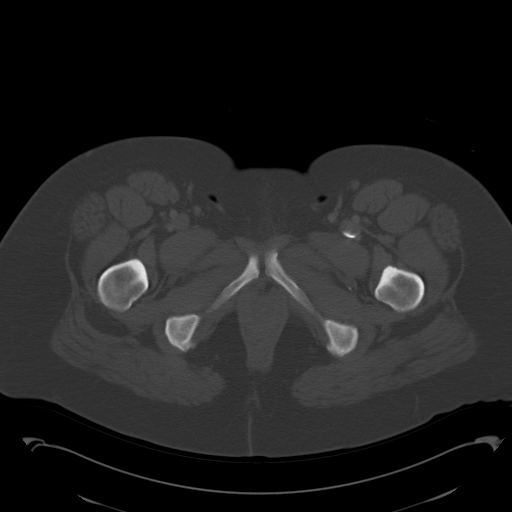
[im 14/114  soft-tissue]
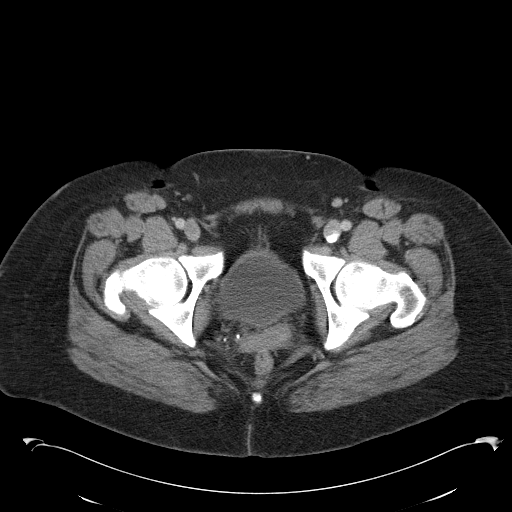
[im 27/114  soft-tissue]
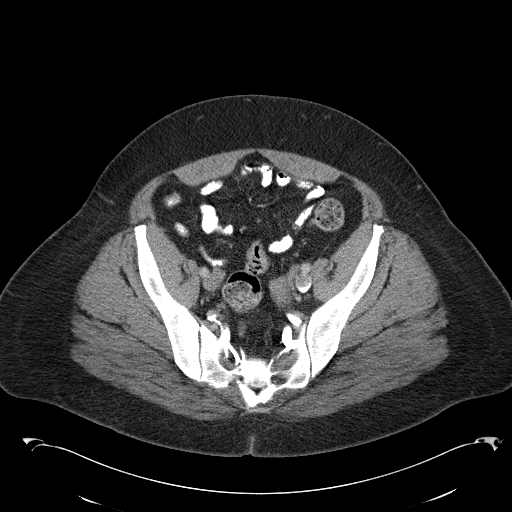
[im 34/114  soft-tissue]
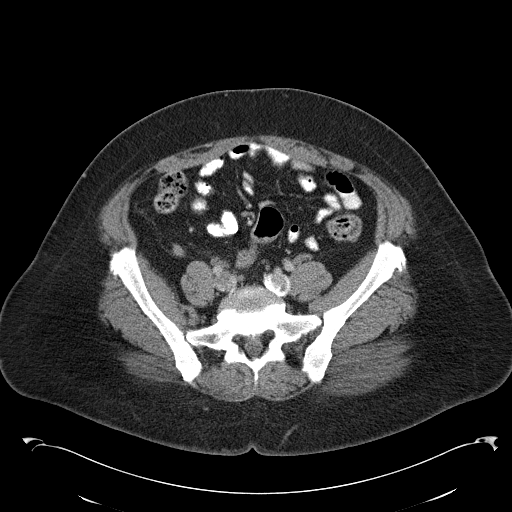
[im 40/114  soft-tissue]
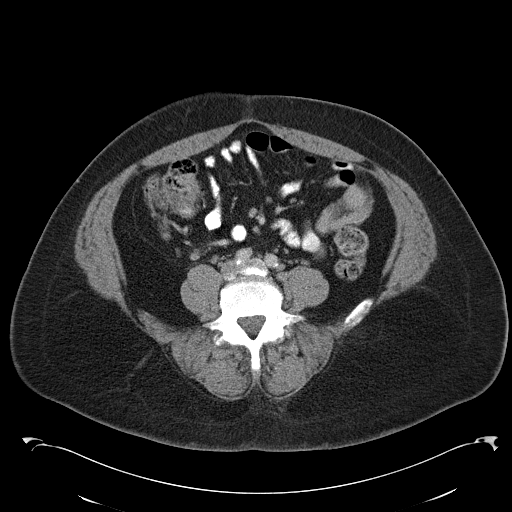
[im 47/114  soft-tissue]
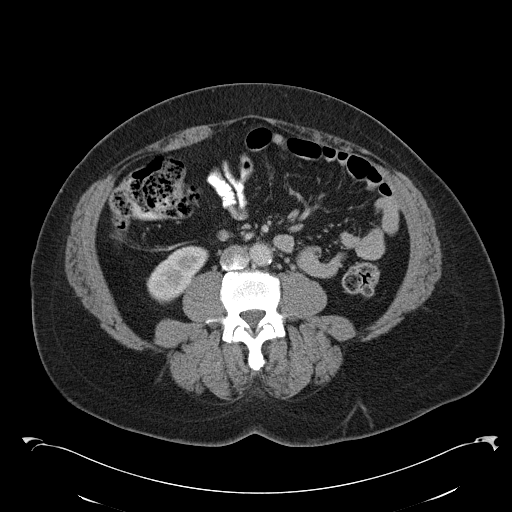
[im 60/114  soft-tissue]
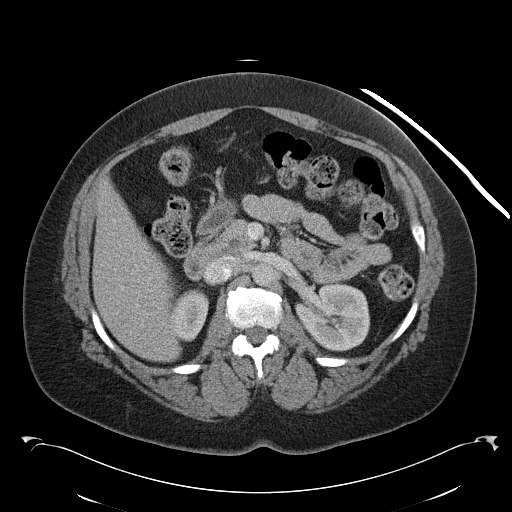
[im 67/114  soft-tissue]
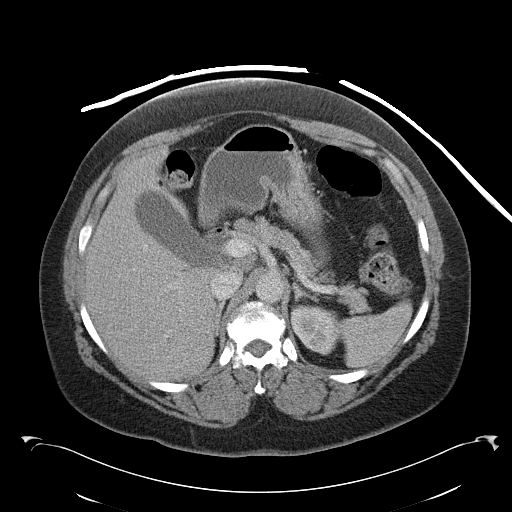
[im 74/114  soft-tissue]
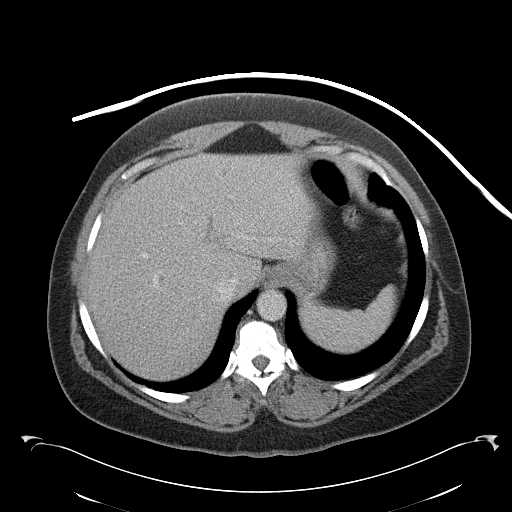
[im 74/114  bone]
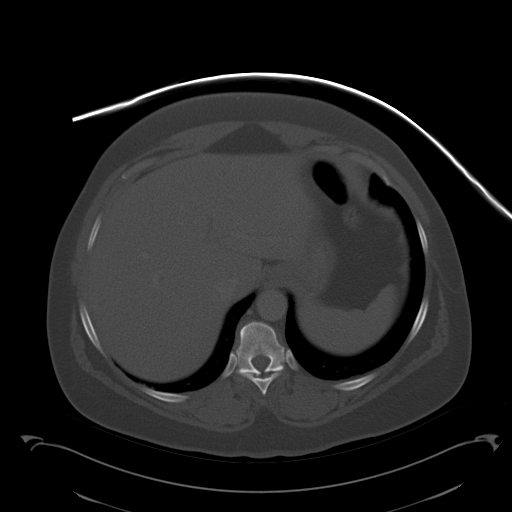
[im 80/114  soft-tissue]
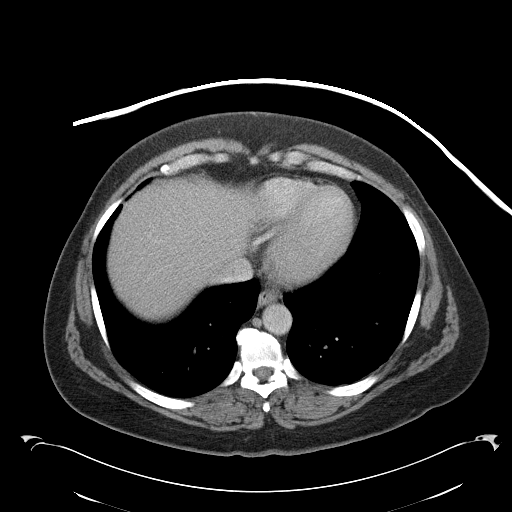
[im 87/114  soft-tissue]
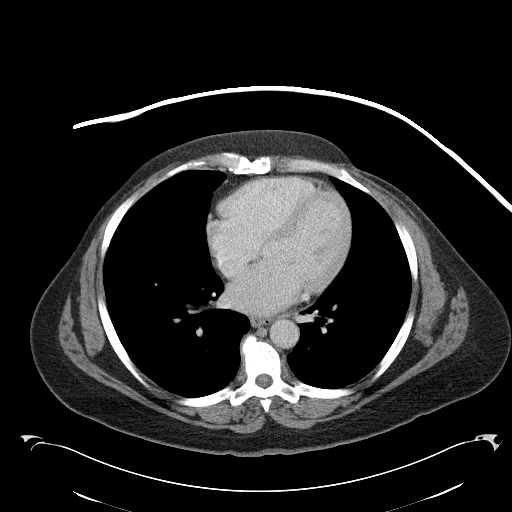
[im 100/114  soft-tissue]
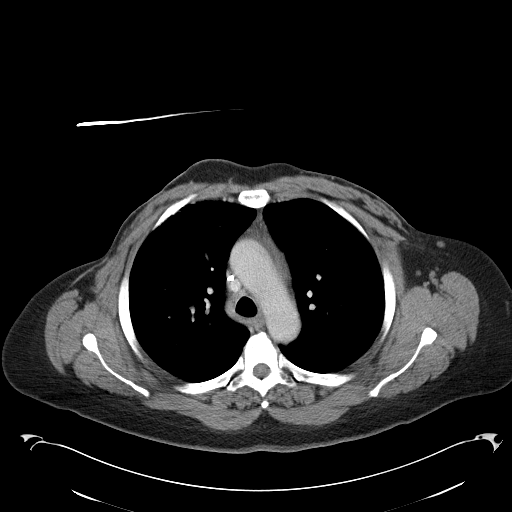
[im 107/114  soft-tissue]
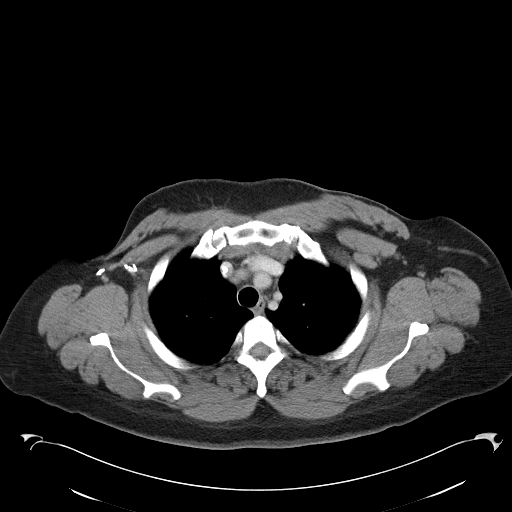

[Series 602: coronal images · coronal · 1.11mm/px · 3 of 100 slices shown]
[im 34/100  soft-tissue]
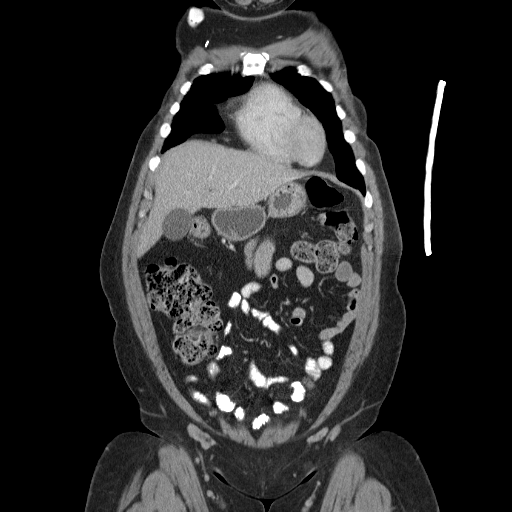
[im 45/100  soft-tissue]
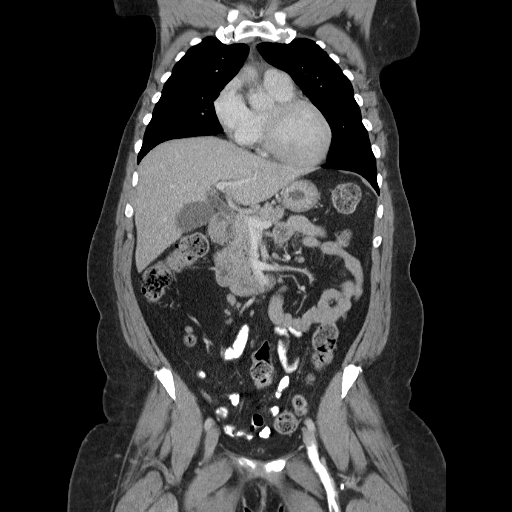
[im 56/100  soft-tissue]
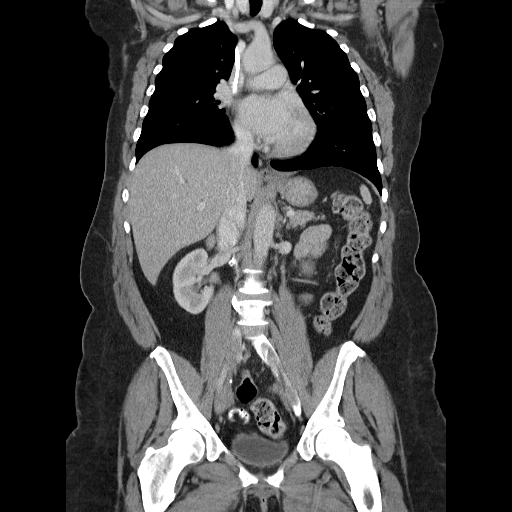

[16 of 46 positions shown; findings below may reference images not displayed]

FINDINGS: There is a port in the right anterior chest wall. Post
mastectomy anatomy.  Right lymphadenectomy clips present. There is
a 13 mm right high axillary node (image #1) which is not can to be
imaged. No of supraclavicular lymphadenopathy.  No evidence of
supraclavicular, internal mammary mediastinal, or hilar
lymphadenopathy.  No pericardial effusion.

Review of the lung windows demonstrates a peripheral reticular
pattern within the right middle lobe which is unchanged from prior
and consistent with radiation therapy.  No new or suspicious
pulmonary nodules.  Airway appears normal.
IMPRESSION: 1.  High right axillary lymph node is not completely imaged (first
slice).  This node is hypermetabolic on comparison PET/CT.

2.  No additional evidence of metastasis.

CT ABDOMEN
FINDINGS: No focal hepatic lesion.  The gallbladder, pancreas,
spleen, adrenal glands, and kidneys appear normal.  The common bile
duct is mildly dilated to 8 mm in diameter which is slightly
decreased from 10 mm on prior.

The stomach, small bowel, appendix, cecum appear normal.  Colon
appears normal.

Abdominal aorta is normal caliber.  No retroperitoneal or
peritoneal lymphadenopathy.
IMPRESSION: No evidence of abdominal metastasis.

CT PELVIS
FINDINGS: No free fluid in the pelvis.  The bladder, uterus, and
ovaries appear normal.  The rectum and sigmoid colon appear normal.

No evidence of pelvic lymphadenopathy.  Bilateral inguinal lymph
nodes are not pathologic by CT size criteria. High density contrast
within the left pelvic venous system consistent with left foot vein
injection.

Review of  bone windows demonstrates no aggressive osseous lesions.
IMPRESSION: No evidence of pelvic metastasis.  No evidence of skeletal
metastasis.

## 2010-01-09 IMAGING — CT NM PET TUM IMG RESTAG (PS) SKULL BASE T - THIGH
1 of 7 series · 1 of 25 positions shown · IV contrast (350 OM)
Comparison: PET CT 03/17/2009, PET CT 12/17/2008, 08/22/2008

CLINICAL DATA: Subsequent treatment strategy for breast cancer.
Radiation and chemotherapy completed.  Ongoing oral chemotherapy.

NUCLEAR MEDICINE PET SKULL BASE TO THIGH
Fasting Blood Glucose:  98
TECHNIQUE: 18.3 mCi F-18 FDG was injected intravenously via the
left foot.  Full-ring PET imaging was performed from the skull base
through the mid-thighs 59  minutes after injection.  CT data was
obtained and used for attenuation correction and anatomic
localization only.  (This was not acquired as a diagnostic CT
examination.)

[Series 2: ct images · axial · 3.8mm · 0.98mm/px · 1 of 223 slices shown]
[im 223/223  brain]
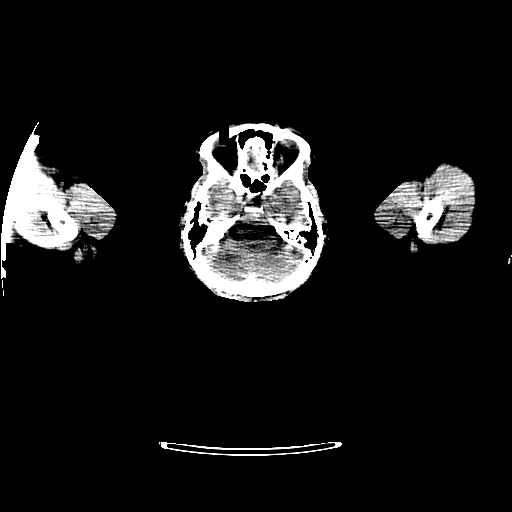

[1 of 25 positions shown; findings below may reference images not displayed]

FINDINGS: Neck: Interval increase in hypermetabolic activity of right level
II  lymph node (image 17).  This node has SUV max = 3.3 increased
from SUV max = 2.5 on prior.  This node was hypermetabolic on more
remote PET CT scans (12/17/2008, 08/22/2008).

Chest:Within the high right axilla, there is a sub pectoralis lymph
node which measures 17 mm short axis (image 43) increased from
13mm.  This lymph node has increased hypermetabolic activity
compared to prior with SUV max = 11.2 compared to SUV max = 5.9 on
prior.  Mild activity and small left axillary nodes is nonspecific.

No additional hypermetabolic mediastinal or hilar nodes.  No
suspicious pulmonary nodules.

Abdomen / Pelvis:No abnormal hypermetabolic activity within the
solid organs.  No evidence of abdominal or pelvic hypermetabolic
nodes. Mild hypermetabolic activity associated with the inguinal
lymph nodes ( SUV max = 2.1) is nonspecific.

Skeleton:No focal hypermetabolic activity to suggest skeletal
metastasis.
IMPRESSION: 1.  Increased in size and metabolic activity of high right sub
pectoralis lymph node consistent with breast cancer nodal
recurrence. This is site of previous hypermetabolic node on
08/22/2008 which showed initially  positive chemotherapy response.
2.  Hypermetabolic right cervical lymph node is also site of
previous nodal activity.

3.  No evidence of distant metastasis.

## 2010-01-11 ENCOUNTER — Ambulatory Visit: Payer: Self-pay | Admitting: Oncology

## 2010-01-13 LAB — COMPREHENSIVE METABOLIC PANEL
ALT: 10 U/L (ref 0–35)
AST: 17 U/L (ref 0–37)
Albumin: 4.4 g/dL (ref 3.5–5.2)
Alkaline Phosphatase: 86 U/L (ref 39–117)
BUN: 11 mg/dL (ref 6–23)
CO2: 21 mEq/L (ref 19–32)
Calcium: 9.8 mg/dL (ref 8.4–10.5)
Chloride: 105 mEq/L (ref 96–112)
Creatinine, Ser: 0.91 mg/dL (ref 0.40–1.20)
Glucose, Bld: 122 mg/dL — ABNORMAL HIGH (ref 70–99)
Potassium: 3.6 mEq/L (ref 3.5–5.3)
Sodium: 141 mEq/L (ref 135–145)
Total Bilirubin: 0.3 mg/dL (ref 0.3–1.2)
Total Protein: 7.1 g/dL (ref 6.0–8.3)

## 2010-01-13 LAB — CBC WITH DIFFERENTIAL/PLATELET
BASO%: 0.9 % (ref 0.0–2.0)
Basophils Absolute: 0.1 10*3/uL (ref 0.0–0.1)
EOS%: 3.2 % (ref 0.0–7.0)
Eosinophils Absolute: 0.2 10*3/uL (ref 0.0–0.5)
HCT: 41.2 % (ref 34.8–46.6)
HGB: 13.6 g/dL (ref 11.6–15.9)
LYMPH%: 26.4 % (ref 14.0–49.7)
MCH: 28.4 pg (ref 25.1–34.0)
MCHC: 33 g/dL (ref 31.5–36.0)
MCV: 86 fL (ref 79.5–101.0)
MONO#: 0.7 10*3/uL (ref 0.1–0.9)
MONO%: 11.4 % (ref 0.0–14.0)
NEUT#: 3.4 10*3/uL (ref 1.5–6.5)
NEUT%: 58.1 % (ref 38.4–76.8)
Platelets: 285 10*3/uL (ref 145–400)
RBC: 4.79 10*6/uL (ref 3.70–5.45)
RDW: 15.7 % — ABNORMAL HIGH (ref 11.2–14.5)
WBC: 5.9 10*3/uL (ref 3.9–10.3)
lymph#: 1.6 10*3/uL (ref 0.9–3.3)
nRBC: 0 % (ref 0–0)

## 2010-01-13 LAB — CANCER ANTIGEN 27.29: CA 27.29: 36 U/mL (ref 0–39)

## 2010-01-20 LAB — CBC WITH DIFFERENTIAL/PLATELET
BASO%: 0.4 % (ref 0.0–2.0)
Basophils Absolute: 0 10*3/uL (ref 0.0–0.1)
EOS%: 3.8 % (ref 0.0–7.0)
Eosinophils Absolute: 0.3 10*3/uL (ref 0.0–0.5)
HCT: 39.6 % (ref 34.8–46.6)
HGB: 12.8 g/dL (ref 11.6–15.9)
LYMPH%: 43.5 % (ref 14.0–49.7)
MCH: 28.4 pg (ref 25.1–34.0)
MCHC: 32.3 g/dL (ref 31.5–36.0)
MCV: 88 fL (ref 79.5–101.0)
MONO#: 0.5 10*3/uL (ref 0.1–0.9)
MONO%: 6.9 % (ref 0.0–14.0)
NEUT#: 3.1 10*3/uL (ref 1.5–6.5)
NEUT%: 45.4 % (ref 38.4–76.8)
Platelets: 303 10*3/uL (ref 145–400)
RBC: 4.5 10*6/uL (ref 3.70–5.45)
RDW: 16.2 % — ABNORMAL HIGH (ref 11.2–14.5)
WBC: 6.8 10*3/uL (ref 3.9–10.3)
lymph#: 3 10*3/uL (ref 0.9–3.3)
nRBC: 0 % (ref 0–0)

## 2010-01-27 LAB — CBC WITH DIFFERENTIAL/PLATELET
BASO%: 0.5 % (ref 0.0–2.0)
Basophils Absolute: 0 10*3/uL (ref 0.0–0.1)
EOS%: 2.7 % (ref 0.0–7.0)
Eosinophils Absolute: 0.2 10*3/uL (ref 0.0–0.5)
HCT: 38.7 % (ref 34.8–46.6)
HGB: 13 g/dL (ref 11.6–15.9)
LYMPH%: 23.5 % (ref 14.0–49.7)
MCH: 28.7 pg (ref 25.1–34.0)
MCHC: 33.6 g/dL (ref 31.5–36.0)
MCV: 85.4 fL (ref 79.5–101.0)
MONO#: 0.5 10*3/uL (ref 0.1–0.9)
MONO%: 6.1 % (ref 0.0–14.0)
NEUT#: 5.1 10*3/uL (ref 1.5–6.5)
NEUT%: 67.2 % (ref 38.4–76.8)
Platelets: 275 10*3/uL (ref 145–400)
RBC: 4.53 10*6/uL (ref 3.70–5.45)
RDW: 15.7 % — ABNORMAL HIGH (ref 11.2–14.5)
WBC: 7.5 10*3/uL (ref 3.9–10.3)
lymph#: 1.8 10*3/uL (ref 0.9–3.3)

## 2010-02-09 ENCOUNTER — Ambulatory Visit: Payer: Self-pay | Admitting: Cardiology

## 2010-02-09 ENCOUNTER — Ambulatory Visit: Admission: RE | Admit: 2010-02-09 | Discharge: 2010-02-09 | Payer: Self-pay | Admitting: Oncology

## 2010-02-09 ENCOUNTER — Encounter: Payer: Self-pay | Admitting: Oncology

## 2010-02-10 ENCOUNTER — Ambulatory Visit: Payer: Self-pay | Admitting: Oncology

## 2010-02-10 LAB — COMPREHENSIVE METABOLIC PANEL
ALT: 9 U/L (ref 0–35)
AST: 13 U/L (ref 0–37)
Albumin: 4.3 g/dL (ref 3.5–5.2)
Alkaline Phosphatase: 73 U/L (ref 39–117)
BUN: 12 mg/dL (ref 6–23)
CO2: 22 mEq/L (ref 19–32)
Calcium: 8.7 mg/dL (ref 8.4–10.5)
Chloride: 105 mEq/L (ref 96–112)
Creatinine, Ser: 0.76 mg/dL (ref 0.40–1.20)
Glucose, Bld: 118 mg/dL — ABNORMAL HIGH (ref 70–99)
Potassium: 3.5 mEq/L (ref 3.5–5.3)
Sodium: 139 mEq/L (ref 135–145)
Total Bilirubin: 0.3 mg/dL (ref 0.3–1.2)
Total Protein: 6.7 g/dL (ref 6.0–8.3)

## 2010-02-10 LAB — CBC WITH DIFFERENTIAL/PLATELET
BASO%: 0.8 % (ref 0.0–2.0)
Basophils Absolute: 0.1 10*3/uL (ref 0.0–0.1)
EOS%: 1.8 % (ref 0.0–7.0)
Eosinophils Absolute: 0.1 10*3/uL (ref 0.0–0.5)
HCT: 38.9 % (ref 34.8–46.6)
HGB: 13 g/dL (ref 11.6–15.9)
LYMPH%: 19.4 % (ref 14.0–49.7)
MCH: 28.8 pg (ref 25.1–34.0)
MCHC: 33.4 g/dL (ref 31.5–36.0)
MCV: 86.1 fL (ref 79.5–101.0)
MONO#: 0.8 10*3/uL (ref 0.1–0.9)
MONO%: 13.3 % (ref 0.0–14.0)
NEUT#: 4 10*3/uL (ref 1.5–6.5)
NEUT%: 64.7 % (ref 38.4–76.8)
Platelets: 280 10*3/uL (ref 145–400)
RBC: 4.52 10*6/uL (ref 3.70–5.45)
RDW: 16.7 % — ABNORMAL HIGH (ref 11.2–14.5)
WBC: 6.2 10*3/uL (ref 3.9–10.3)
lymph#: 1.2 10*3/uL (ref 0.9–3.3)

## 2010-02-10 LAB — CANCER ANTIGEN 27.29: CA 27.29: 30 U/mL (ref 0–39)

## 2010-02-17 LAB — CBC WITH DIFFERENTIAL/PLATELET
BASO%: 0.5 % (ref 0.0–2.0)
Basophils Absolute: 0 10*3/uL (ref 0.0–0.1)
EOS%: 3.5 % (ref 0.0–7.0)
Eosinophils Absolute: 0.3 10*3/uL (ref 0.0–0.5)
HCT: 35.4 % (ref 34.8–46.6)
HGB: 11.7 g/dL (ref 11.6–15.9)
LYMPH%: 27.4 % (ref 14.0–49.7)
MCH: 28.5 pg (ref 25.1–34.0)
MCHC: 33.1 g/dL (ref 31.5–36.0)
MCV: 86.3 fL (ref 79.5–101.0)
MONO#: 0.6 10*3/uL (ref 0.1–0.9)
MONO%: 8.2 % (ref 0.0–14.0)
NEUT#: 4.7 10*3/uL (ref 1.5–6.5)
NEUT%: 60.4 % (ref 38.4–76.8)
Platelets: 266 10*3/uL (ref 145–400)
RBC: 4.1 10*6/uL (ref 3.70–5.45)
RDW: 16.2 % — ABNORMAL HIGH (ref 11.2–14.5)
WBC: 7.8 10*3/uL (ref 3.9–10.3)
lymph#: 2.1 10*3/uL (ref 0.9–3.3)
nRBC: 0 % (ref 0–0)

## 2010-02-24 LAB — CBC WITH DIFFERENTIAL/PLATELET
BASO%: 0.7 % (ref 0.0–2.0)
Basophils Absolute: 0.1 10*3/uL (ref 0.0–0.1)
EOS%: 3.1 % (ref 0.0–7.0)
Eosinophils Absolute: 0.2 10*3/uL (ref 0.0–0.5)
HCT: 35.8 % (ref 34.8–46.6)
HGB: 11.9 g/dL (ref 11.6–15.9)
LYMPH%: 42.8 % (ref 14.0–49.7)
MCH: 29.7 pg (ref 25.1–34.0)
MCHC: 33.2 g/dL (ref 31.5–36.0)
MCV: 89.4 fL (ref 79.5–101.0)
MONO#: 0.4 10*3/uL (ref 0.1–0.9)
MONO%: 5.1 % (ref 0.0–14.0)
NEUT#: 3.6 10*3/uL (ref 1.5–6.5)
NEUT%: 48.3 % (ref 38.4–76.8)
Platelets: 320 10*3/uL (ref 145–400)
RBC: 4.01 10*6/uL (ref 3.70–5.45)
RDW: 16.9 % — ABNORMAL HIGH (ref 11.2–14.5)
WBC: 7.5 10*3/uL (ref 3.9–10.3)
lymph#: 3.2 10*3/uL (ref 0.9–3.3)

## 2010-03-01 ENCOUNTER — Ambulatory Visit (HOSPITAL_COMMUNITY): Admission: RE | Admit: 2010-03-01 | Discharge: 2010-03-01 | Payer: Self-pay | Admitting: Oncology

## 2010-03-03 LAB — CBC WITH DIFFERENTIAL/PLATELET
BASO%: 0.5 % (ref 0.0–2.0)
Basophils Absolute: 0 10*3/uL (ref 0.0–0.1)
EOS%: 1.9 % (ref 0.0–7.0)
Eosinophils Absolute: 0.2 10*3/uL (ref 0.0–0.5)
HCT: 38.1 % (ref 34.8–46.6)
HGB: 12.6 g/dL (ref 11.6–15.9)
LYMPH%: 31.1 % (ref 14.0–49.7)
MCH: 29 pg (ref 25.1–34.0)
MCHC: 33.1 g/dL (ref 31.5–36.0)
MCV: 87.6 fL (ref 79.5–101.0)
MONO#: 0.6 10*3/uL (ref 0.1–0.9)
MONO%: 7 % (ref 0.0–14.0)
NEUT#: 4.8 10*3/uL (ref 1.5–6.5)
NEUT%: 59.5 % (ref 38.4–76.8)
Platelets: 332 10*3/uL (ref 145–400)
RBC: 4.35 10*6/uL (ref 3.70–5.45)
RDW: 17.1 % — ABNORMAL HIGH (ref 11.2–14.5)
WBC: 8.1 10*3/uL (ref 3.9–10.3)
lymph#: 2.5 10*3/uL (ref 0.9–3.3)

## 2010-03-06 ENCOUNTER — Emergency Department (HOSPITAL_COMMUNITY): Admission: EM | Admit: 2010-03-06 | Discharge: 2010-03-06 | Payer: Self-pay | Admitting: Emergency Medicine

## 2010-03-24 ENCOUNTER — Ambulatory Visit: Payer: Self-pay | Admitting: Oncology

## 2010-03-24 LAB — CBC WITH DIFFERENTIAL/PLATELET
BASO%: 0.5 % (ref 0.0–2.0)
Basophils Absolute: 0.1 10*3/uL (ref 0.0–0.1)
EOS%: 1.5 % (ref 0.0–7.0)
Eosinophils Absolute: 0.1 10*3/uL (ref 0.0–0.5)
HCT: 40 % (ref 34.8–46.6)
HGB: 13.3 g/dL (ref 11.6–15.9)
LYMPH%: 27.1 % (ref 14.0–49.7)
MCH: 28.8 pg (ref 25.1–34.0)
MCHC: 33.3 g/dL (ref 31.5–36.0)
MCV: 86.6 fL (ref 79.5–101.0)
MONO#: 0.6 10*3/uL (ref 0.1–0.9)
MONO%: 6.6 % (ref 0.0–14.0)
NEUT#: 6 10*3/uL (ref 1.5–6.5)
NEUT%: 64.3 % (ref 38.4–76.8)
Platelets: 269 10*3/uL (ref 145–400)
RBC: 4.62 10*6/uL (ref 3.70–5.45)
RDW: 15.9 % — ABNORMAL HIGH (ref 11.2–14.5)
WBC: 9.4 10*3/uL (ref 3.9–10.3)
lymph#: 2.5 10*3/uL (ref 0.9–3.3)

## 2010-03-24 LAB — COMPREHENSIVE METABOLIC PANEL
ALT: 8 U/L (ref 0–35)
AST: 14 U/L (ref 0–37)
Albumin: 4.3 g/dL (ref 3.5–5.2)
Alkaline Phosphatase: 75 U/L (ref 39–117)
BUN: 13 mg/dL (ref 6–23)
CO2: 24 mEq/L (ref 19–32)
Calcium: 9.8 mg/dL (ref 8.4–10.5)
Chloride: 105 mEq/L (ref 96–112)
Creatinine, Ser: 0.82 mg/dL (ref 0.40–1.20)
Glucose, Bld: 104 mg/dL — ABNORMAL HIGH (ref 70–99)
Potassium: 3.6 mEq/L (ref 3.5–5.3)
Sodium: 141 mEq/L (ref 135–145)
Total Bilirubin: 0.3 mg/dL (ref 0.3–1.2)
Total Protein: 7 g/dL (ref 6.0–8.3)

## 2010-03-24 LAB — CANCER ANTIGEN 27.29: CA 27.29: 31 U/mL (ref 0–39)

## 2010-04-14 ENCOUNTER — Ambulatory Visit: Payer: Self-pay | Admitting: Vascular Surgery

## 2010-04-14 ENCOUNTER — Ambulatory Visit: Admission: RE | Admit: 2010-04-14 | Discharge: 2010-04-14 | Payer: Self-pay | Admitting: Oncology

## 2010-04-14 ENCOUNTER — Encounter: Payer: Self-pay | Admitting: Oncology

## 2010-04-14 LAB — COMPREHENSIVE METABOLIC PANEL
ALT: 12 U/L (ref 0–35)
AST: 19 U/L (ref 0–37)
Albumin: 4.2 g/dL (ref 3.5–5.2)
Alkaline Phosphatase: 86 U/L (ref 39–117)
BUN: 18 mg/dL (ref 6–23)
CO2: 28 mEq/L (ref 19–32)
Calcium: 9.3 mg/dL (ref 8.4–10.5)
Chloride: 101 mEq/L (ref 96–112)
Creatinine, Ser: 0.98 mg/dL (ref 0.40–1.20)
Glucose, Bld: 90 mg/dL (ref 70–99)
Potassium: 3.7 mEq/L (ref 3.5–5.3)
Sodium: 139 mEq/L (ref 135–145)
Total Bilirubin: 0.2 mg/dL — ABNORMAL LOW (ref 0.3–1.2)
Total Protein: 7.2 g/dL (ref 6.0–8.3)

## 2010-04-14 LAB — CBC WITH DIFFERENTIAL/PLATELET
BASO%: 0.5 % (ref 0.0–2.0)
Basophils Absolute: 0 10*3/uL (ref 0.0–0.1)
EOS%: 4.5 % (ref 0.0–7.0)
Eosinophils Absolute: 0.3 10*3/uL (ref 0.0–0.5)
HCT: 38.3 % (ref 34.8–46.6)
HGB: 12.8 g/dL (ref 11.6–15.9)
LYMPH%: 24.5 % (ref 14.0–49.7)
MCH: 29.1 pg (ref 25.1–34.0)
MCHC: 33.5 g/dL (ref 31.5–36.0)
MCV: 87.1 fL (ref 79.5–101.0)
MONO#: 0.5 10*3/uL (ref 0.1–0.9)
MONO%: 6.6 % (ref 0.0–14.0)
NEUT#: 4.7 10*3/uL (ref 1.5–6.5)
NEUT%: 63.9 % (ref 38.4–76.8)
Platelets: 353 10*3/uL (ref 145–400)
RBC: 4.4 10*6/uL (ref 3.70–5.45)
RDW: 15 % — ABNORMAL HIGH (ref 11.2–14.5)
WBC: 7.4 10*3/uL (ref 3.9–10.3)
lymph#: 1.8 10*3/uL (ref 0.9–3.3)

## 2010-04-19 ENCOUNTER — Encounter: Admission: RE | Admit: 2010-04-19 | Discharge: 2010-06-09 | Payer: Self-pay | Admitting: Oncology

## 2010-05-03 ENCOUNTER — Ambulatory Visit: Payer: Self-pay | Admitting: Oncology

## 2010-05-05 LAB — CBC WITH DIFFERENTIAL/PLATELET
BASO%: 0.3 % (ref 0.0–2.0)
Basophils Absolute: 0 10*3/uL (ref 0.0–0.1)
EOS%: 2.4 % (ref 0.0–7.0)
Eosinophils Absolute: 0.2 10*3/uL (ref 0.0–0.5)
HCT: 41.3 % (ref 34.8–46.6)
HGB: 13.6 g/dL (ref 11.6–15.9)
LYMPH%: 26.3 % (ref 14.0–49.7)
MCH: 28.9 pg (ref 25.1–34.0)
MCHC: 32.9 g/dL (ref 31.5–36.0)
MCV: 87.9 fL (ref 79.5–101.0)
MONO#: 0.4 10*3/uL (ref 0.1–0.9)
MONO%: 5.5 % (ref 0.0–14.0)
NEUT#: 5.3 10*3/uL (ref 1.5–6.5)
NEUT%: 65.5 % (ref 38.4–76.8)
Platelets: 218 10*3/uL (ref 145–400)
RBC: 4.7 10*6/uL (ref 3.70–5.45)
RDW: 14.1 % (ref 11.2–14.5)
WBC: 8.1 10*3/uL (ref 3.9–10.3)
lymph#: 2.1 10*3/uL (ref 0.9–3.3)

## 2010-05-05 LAB — COMPREHENSIVE METABOLIC PANEL
ALT: 8 U/L (ref 0–35)
AST: 14 U/L (ref 0–37)
Albumin: 4.4 g/dL (ref 3.5–5.2)
Alkaline Phosphatase: 69 U/L (ref 39–117)
BUN: 17 mg/dL (ref 6–23)
CO2: 23 mEq/L (ref 19–32)
Calcium: 10.2 mg/dL (ref 8.4–10.5)
Chloride: 108 mEq/L (ref 96–112)
Creatinine, Ser: 0.93 mg/dL (ref 0.40–1.20)
Glucose, Bld: 114 mg/dL — ABNORMAL HIGH (ref 70–99)
Potassium: 3.9 mEq/L (ref 3.5–5.3)
Sodium: 143 mEq/L (ref 135–145)
Total Bilirubin: 0.3 mg/dL (ref 0.3–1.2)
Total Protein: 6.9 g/dL (ref 6.0–8.3)

## 2010-05-18 ENCOUNTER — Encounter: Payer: Self-pay | Admitting: Oncology

## 2010-05-18 ENCOUNTER — Ambulatory Visit: Payer: Self-pay | Admitting: Cardiology

## 2010-05-18 ENCOUNTER — Ambulatory Visit: Admission: RE | Admit: 2010-05-18 | Discharge: 2010-05-18 | Payer: Self-pay | Admitting: Oncology

## 2010-05-26 LAB — COMPREHENSIVE METABOLIC PANEL
ALT: 9 U/L (ref 0–35)
AST: 16 U/L (ref 0–37)
Albumin: 4.2 g/dL (ref 3.5–5.2)
Alkaline Phosphatase: 76 U/L (ref 39–117)
BUN: 19 mg/dL (ref 6–23)
CO2: 25 mEq/L (ref 19–32)
Calcium: 9 mg/dL (ref 8.4–10.5)
Chloride: 103 mEq/L (ref 96–112)
Creatinine, Ser: 0.85 mg/dL (ref 0.40–1.20)
Glucose, Bld: 93 mg/dL (ref 70–99)
Potassium: 3.7 mEq/L (ref 3.5–5.3)
Sodium: 140 mEq/L (ref 135–145)
Total Bilirubin: 0.2 mg/dL — ABNORMAL LOW (ref 0.3–1.2)
Total Protein: 6.8 g/dL (ref 6.0–8.3)

## 2010-05-26 LAB — CBC WITH DIFFERENTIAL/PLATELET
BASO%: 0.6 % (ref 0.0–2.0)
Basophils Absolute: 0.1 10*3/uL (ref 0.0–0.1)
EOS%: 4.3 % (ref 0.0–7.0)
Eosinophils Absolute: 0.4 10*3/uL (ref 0.0–0.5)
HCT: 38 % (ref 34.8–46.6)
HGB: 12.6 g/dL (ref 11.6–15.9)
LYMPH%: 35.4 % (ref 14.0–49.7)
MCH: 29 pg (ref 25.1–34.0)
MCHC: 33.2 g/dL (ref 31.5–36.0)
MCV: 87.4 fL (ref 79.5–101.0)
MONO#: 0.7 10*3/uL (ref 0.1–0.9)
MONO%: 7.9 % (ref 0.0–14.0)
NEUT#: 4.3 10*3/uL (ref 1.5–6.5)
NEUT%: 51.8 % (ref 38.4–76.8)
Platelets: 323 10*3/uL (ref 145–400)
RBC: 4.35 10*6/uL (ref 3.70–5.45)
RDW: 14.2 % (ref 11.2–14.5)
WBC: 8.4 10*3/uL (ref 3.9–10.3)
lymph#: 3 10*3/uL (ref 0.9–3.3)

## 2010-05-26 LAB — CANCER ANTIGEN 27.29: CA 27.29: 19 U/mL (ref 0–39)

## 2010-06-14 ENCOUNTER — Ambulatory Visit: Payer: Self-pay | Admitting: Oncology

## 2010-06-16 LAB — COMPREHENSIVE METABOLIC PANEL
ALT: 11 U/L (ref 0–35)
AST: 17 U/L (ref 0–37)
Albumin: 4.1 g/dL (ref 3.5–5.2)
Alkaline Phosphatase: 84 U/L (ref 39–117)
BUN: 18 mg/dL (ref 6–23)
CO2: 25 mEq/L (ref 19–32)
Calcium: 9.4 mg/dL (ref 8.4–10.5)
Chloride: 104 mEq/L (ref 96–112)
Creatinine, Ser: 1.01 mg/dL (ref 0.40–1.20)
Glucose, Bld: 116 mg/dL — ABNORMAL HIGH (ref 70–99)
Potassium: 3.3 mEq/L — ABNORMAL LOW (ref 3.5–5.3)
Sodium: 141 mEq/L (ref 135–145)
Total Bilirubin: 0.2 mg/dL — ABNORMAL LOW (ref 0.3–1.2)
Total Protein: 6.6 g/dL (ref 6.0–8.3)

## 2010-06-16 LAB — CBC WITH DIFFERENTIAL/PLATELET
BASO%: 0 % (ref 0.0–2.0)
Basophils Absolute: 0 10*3/uL (ref 0.0–0.1)
EOS%: 3.3 % (ref 0.0–7.0)
Eosinophils Absolute: 0.3 10*3/uL (ref 0.0–0.5)
HCT: 40 % (ref 34.8–46.6)
HGB: 13.3 g/dL (ref 11.6–15.9)
LYMPH%: 23.5 % (ref 14.0–49.7)
MCH: 28.5 pg (ref 25.1–34.0)
MCHC: 33.2 g/dL (ref 31.5–36.0)
MCV: 85.9 fL (ref 79.5–101.0)
MONO#: 0.1 10*3/uL (ref 0.1–0.9)
MONO%: 1.1 % (ref 0.0–14.0)
NEUT#: 5.5 10*3/uL (ref 1.5–6.5)
NEUT%: 72.1 % (ref 38.4–76.8)
Platelets: 292 10*3/uL (ref 145–400)
RBC: 4.65 10*6/uL (ref 3.70–5.45)
RDW: 14.1 % (ref 11.2–14.5)
WBC: 7.6 10*3/uL (ref 3.9–10.3)
lymph#: 1.8 10*3/uL (ref 0.9–3.3)

## 2010-06-26 ENCOUNTER — Emergency Department (HOSPITAL_COMMUNITY): Admission: EM | Admit: 2010-06-26 | Discharge: 2010-06-26 | Payer: Self-pay | Admitting: Emergency Medicine

## 2010-07-07 LAB — COMPREHENSIVE METABOLIC PANEL
ALT: 8 U/L (ref 0–35)
AST: 14 U/L (ref 0–37)
Albumin: 4.2 g/dL (ref 3.5–5.2)
Alkaline Phosphatase: 88 U/L (ref 39–117)
BUN: 14 mg/dL (ref 6–23)
CO2: 23 mEq/L (ref 19–32)
Calcium: 9.3 mg/dL (ref 8.4–10.5)
Chloride: 107 mEq/L (ref 96–112)
Creatinine, Ser: 0.85 mg/dL (ref 0.40–1.20)
Glucose, Bld: 95 mg/dL (ref 70–99)
Potassium: 4 mEq/L (ref 3.5–5.3)
Sodium: 141 mEq/L (ref 135–145)
Total Bilirubin: 0.3 mg/dL (ref 0.3–1.2)
Total Protein: 6.8 g/dL (ref 6.0–8.3)

## 2010-07-07 LAB — CBC WITH DIFFERENTIAL/PLATELET
BASO%: 0.6 % (ref 0.0–2.0)
Basophils Absolute: 0.1 10*3/uL (ref 0.0–0.1)
EOS%: 4.4 % (ref 0.0–7.0)
Eosinophils Absolute: 0.4 10*3/uL (ref 0.0–0.5)
HCT: 40.7 % (ref 34.8–46.6)
HGB: 13.3 g/dL (ref 11.6–15.9)
LYMPH%: 21.6 % (ref 14.0–49.7)
MCH: 28.3 pg (ref 25.1–34.0)
MCHC: 32.7 g/dL (ref 31.5–36.0)
MCV: 86.6 fL (ref 79.5–101.0)
MONO#: 0.5 10*3/uL (ref 0.1–0.9)
MONO%: 6.2 % (ref 0.0–14.0)
NEUT#: 5.7 10*3/uL (ref 1.5–6.5)
NEUT%: 67.2 % (ref 38.4–76.8)
Platelets: 276 10*3/uL (ref 145–400)
RBC: 4.7 10*6/uL (ref 3.70–5.45)
RDW: 14.2 % (ref 11.2–14.5)
WBC: 8.4 10*3/uL (ref 3.9–10.3)
lymph#: 1.8 10*3/uL (ref 0.9–3.3)
nRBC: 0 % (ref 0–0)

## 2010-07-15 IMAGING — PT NM PET TUM IMG RESTAG (PS) SKULL BASE T - THIGH
6 series · 25 of 25 positions shown · non-contrast
Comparison: CT chest abdomen pelvis 03/01/2010 and PET CT and CT
chest abdomen pelvis 08/26/2009

CLINICAL DATA: Subsequent treatment strategy for breast cancer.

NUCLEAR MEDICINE PET CT RESTAGING (PS) SKULL BASE TO THIGH
TECHNIQUE: 16.7 mCi F-18 FDG was injected intravenously via the
right foot.  Full-ring PET imaging was performed from the skull
base through the mid-thighs 60  minutes after injection.  CT data
was obtained and used for attenuation correction and anatomic
localization only.  (This was not acquired as a diagnostic CT
examination.)
Fasting Blood Glucose:  121

[Series 1: pet ac · axial · 3.3mm · 4.69mm/px · z∈[-870,+0]mm · 5 of 267 slices shown]
[im 1/267]
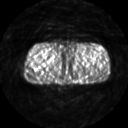
[im 67/267]
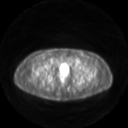
[im 134/267]
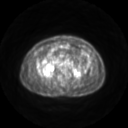
[im 200/267]
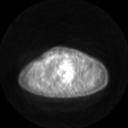
[im 267/267]
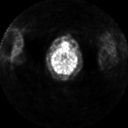

[Series 2: pet nac · axial · 3.3mm · 4.69mm/px · z∈[-870,+0]mm · 6 of 267 slices shown]
[im 1/267]
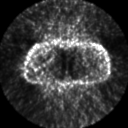
[im 54/267]
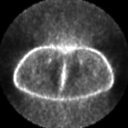
[im 107/267]
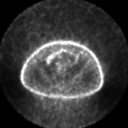
[im 160/267]
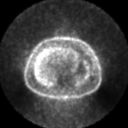
[im 213/267]
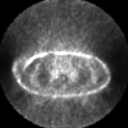
[im 267/267]
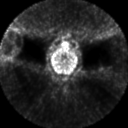

[Series 2: ct images · axial · 3.8mm · 0.98mm/px · z∈[-870,+0]mm · 6 of 254 slices shown]
[im 1/254]
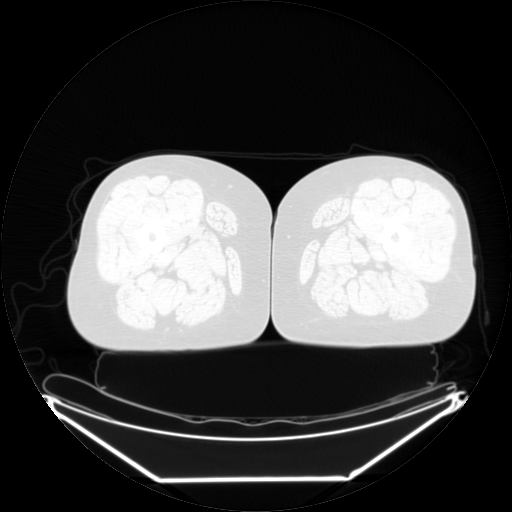
[im 51/254]
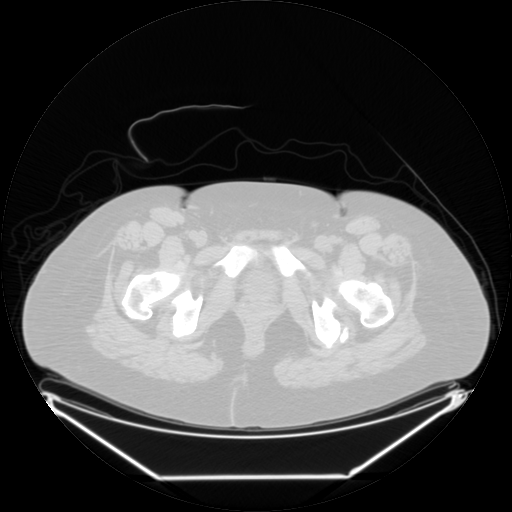
[im 102/254]
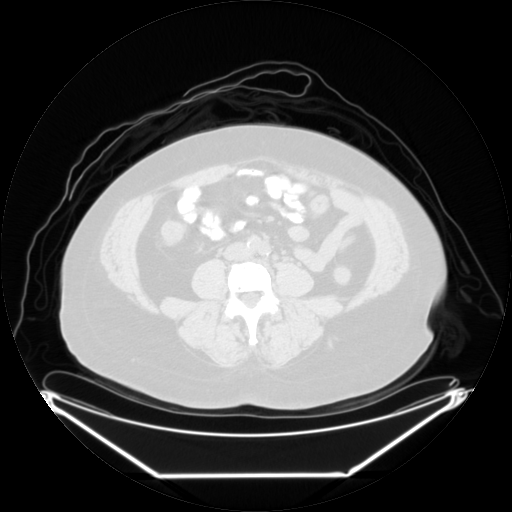
[im 152/254]
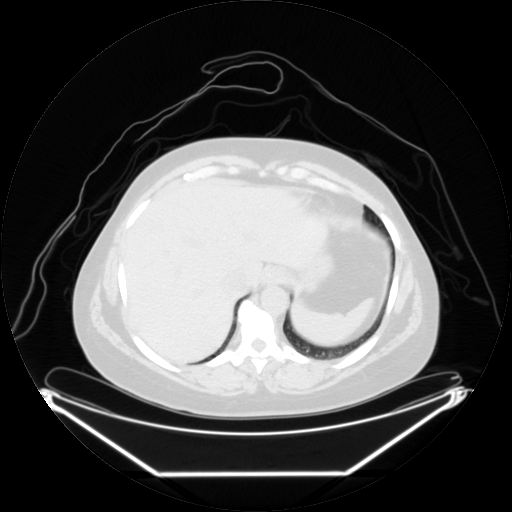
[im 203/254]
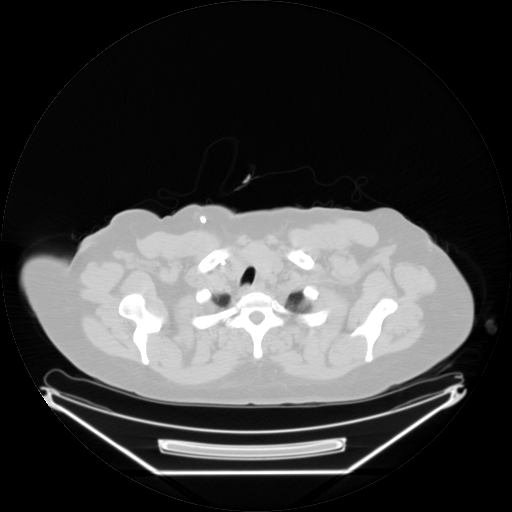
[im 254/254  brain]
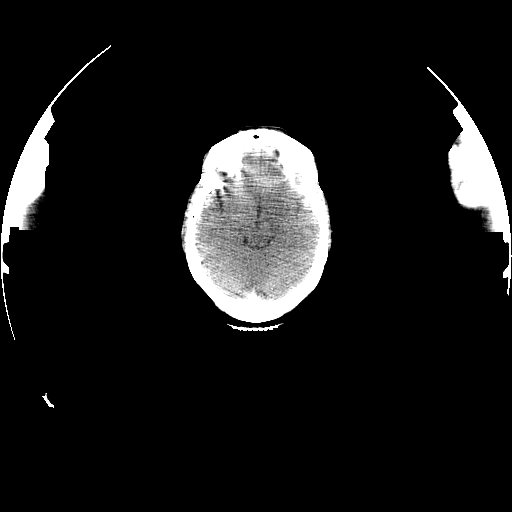

[Series 123: mip · coronal · 3.3mm · 4.69mm/px · 1 of 30 slices shown]
[im 1/30]
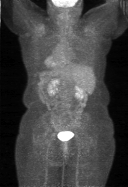

[Series 151: reformatted · axial · 3.3mm · 3.91mm/px · z∈[-870,+0]mm · 6 of 265 slices shown (1 of 2)]
[im 1/265]
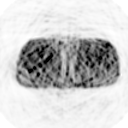
[im 53/265]
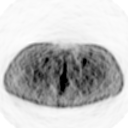
[im 106/265]
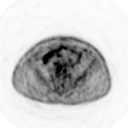
[im 159/265]
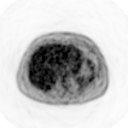
[im 212/265]
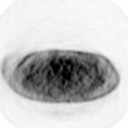
[im 265/265]
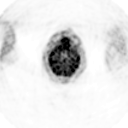

[Series 153: reformatted · coronal · 4.7mm · 6.98mm/px · 1 of 68 slices shown (2 of 2)]
[im 1/68]
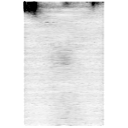

[25 of 25 positions shown; findings below may reference images not displayed]

FINDINGS: There are no areas of abnormal hypermetabolism in the
neck, chest, abdomen or pelvis.

CT images were performed for attenuation correction.  CT images of
the neck show no acute findings.  Interpretation of CT images of
the chest, abdomen and pelvis will be deferred to diagnostic exam
performed the same day, dictated separately.
IMPRESSION: 1.  No evidence of residual, recurrent or metastatic breast cancer.
2.  Interpretation of CT images of the chest, abdomen and pelvis
will be deferred to diagnostic exam performed the same day.

## 2010-07-15 IMAGING — CT CT ABD-PELV W/ CM
2 of 6 series · 17 of 46 positions shown, 19 images · IV contrast (agent unspecified)
Comparison: PET CT and CT chest abdomen pelvis 08/26/2009

CT CHEST

CLINICAL DATA: Breast cancer with recurrence.  Indigestion and
upper back pain.

CT CHEST, ABDOMEN AND PELVIS WITH CONTRAST
TECHNIQUE: Multidetector CT imaging of the chest, abdomen and
pelvis was performed following the standard protocol during bolus
administration of intravenous contrast.
Contrast: 100 ml 1mnipaque-NXX

[Series 2: cap with st · axial · 0.80mm/px · z∈[+375,+890]mm · 14 of 117 slices shown, 16 images]
[im 7/117  soft-tissue]
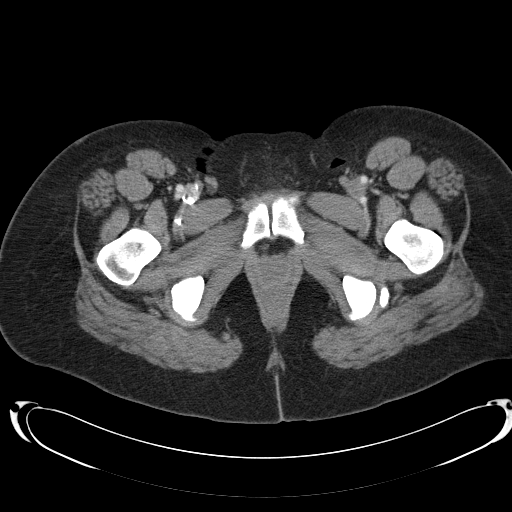
[im 7/117  bone]
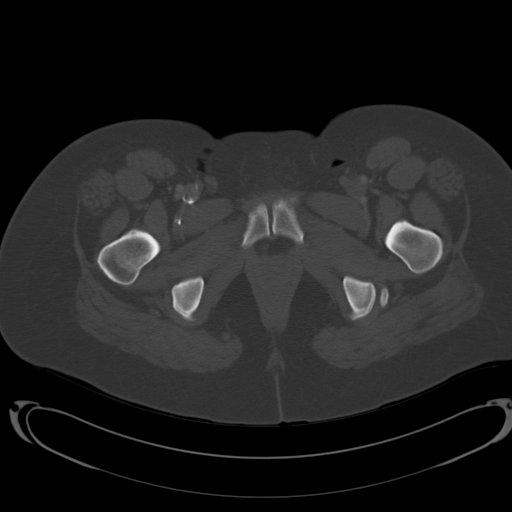
[im 14/117  soft-tissue]
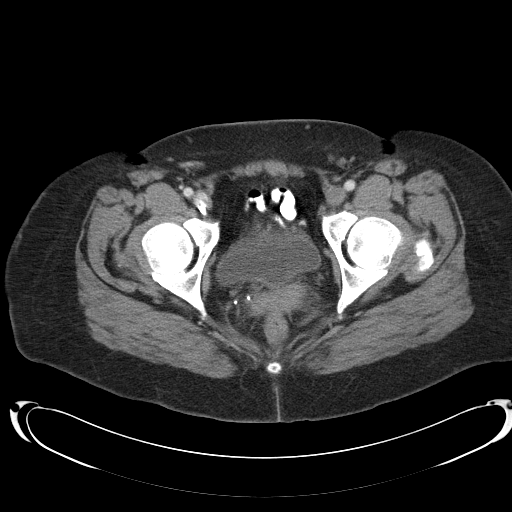
[im 21/117  soft-tissue]
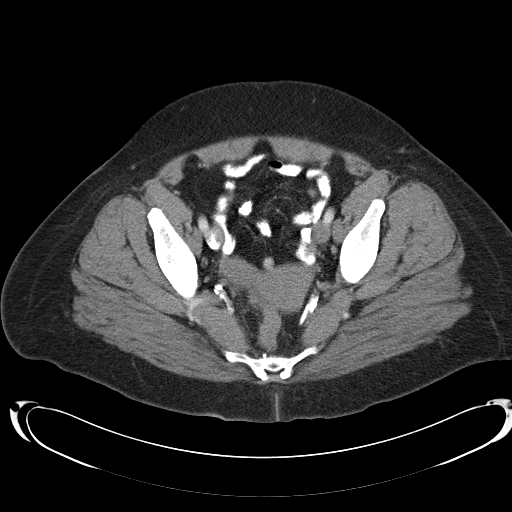
[im 35/117  soft-tissue]
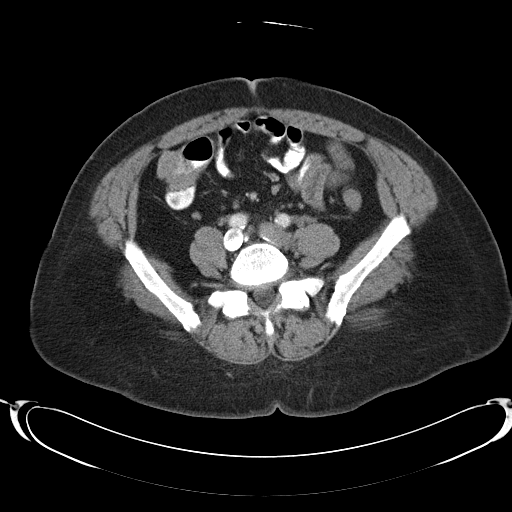
[im 41/117  soft-tissue]
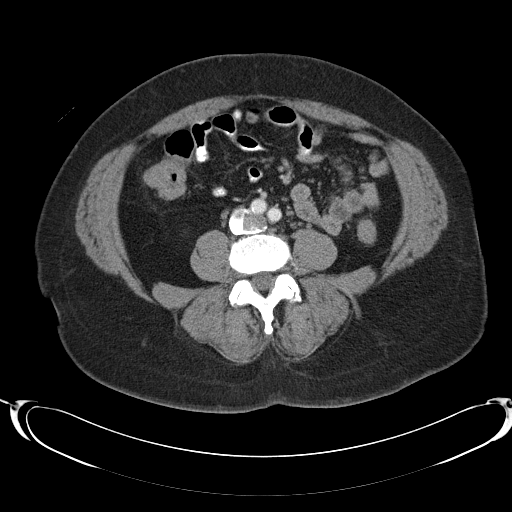
[im 48/117  soft-tissue]
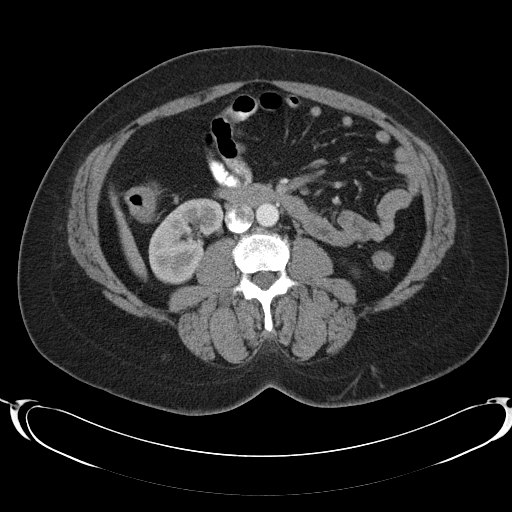
[im 55/117  soft-tissue]
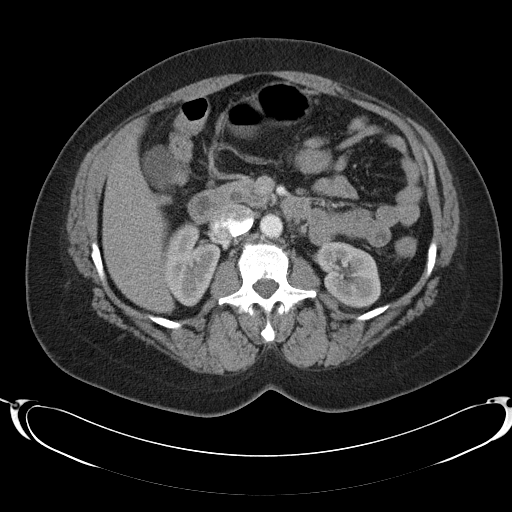
[im 62/117  soft-tissue]
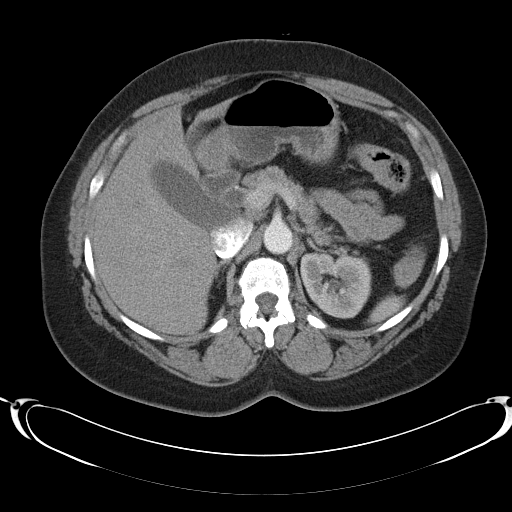
[im 69/117  soft-tissue]
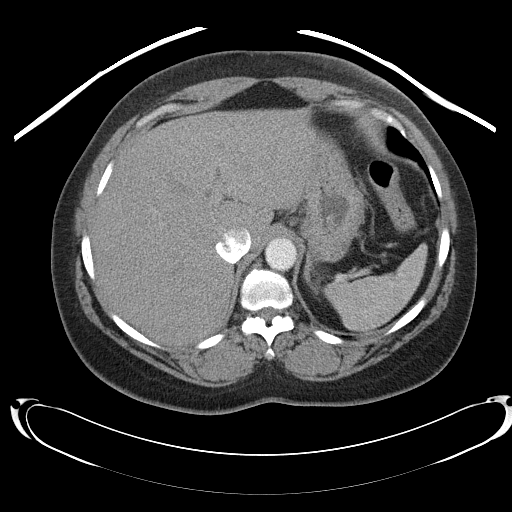
[im 69/117  bone]
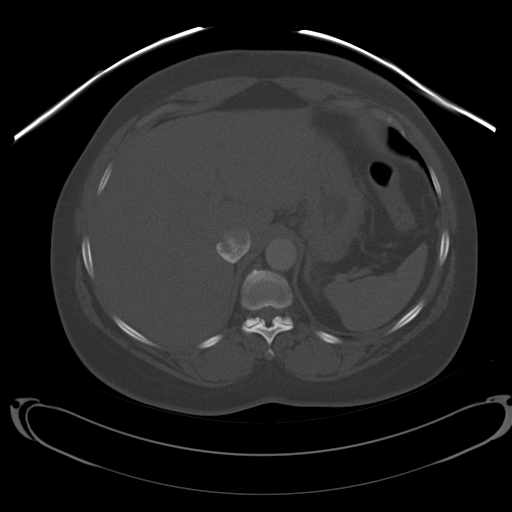
[im 76/117  soft-tissue]
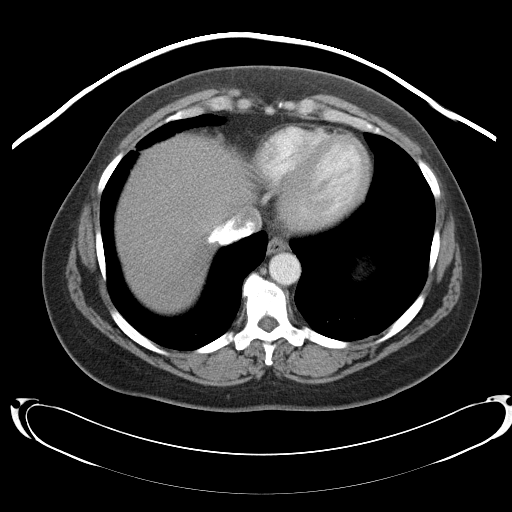
[im 89/117  soft-tissue]
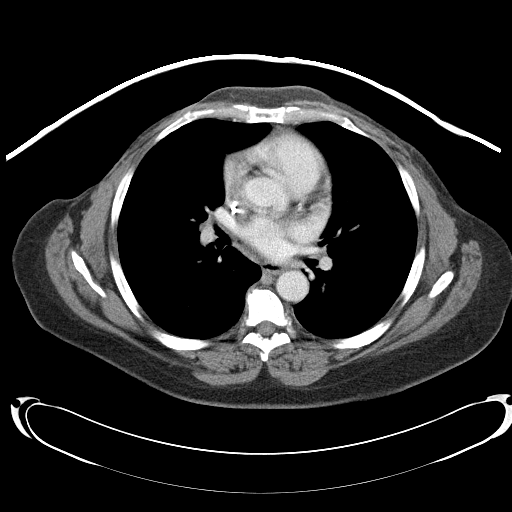
[im 96/117  soft-tissue]
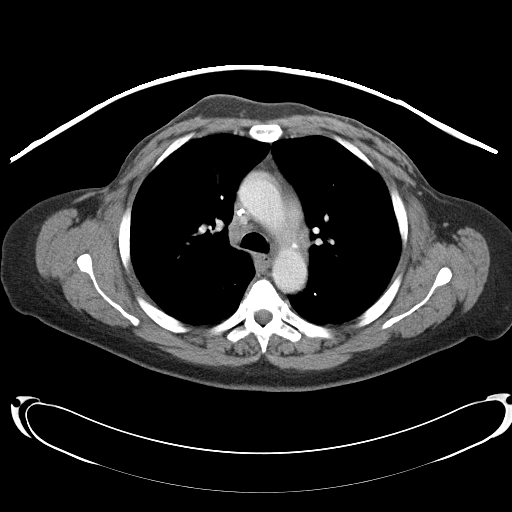
[im 103/117  soft-tissue]
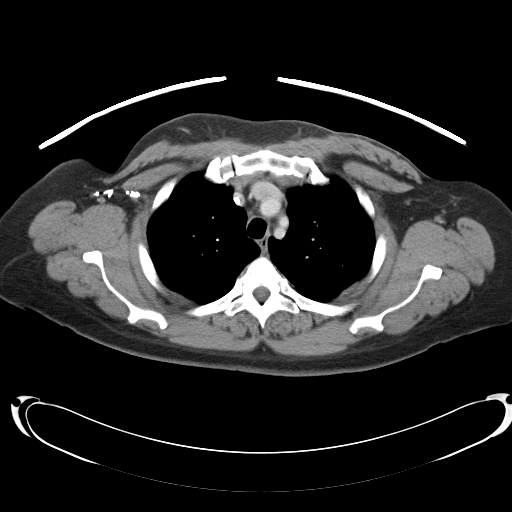
[im 110/117  soft-tissue]
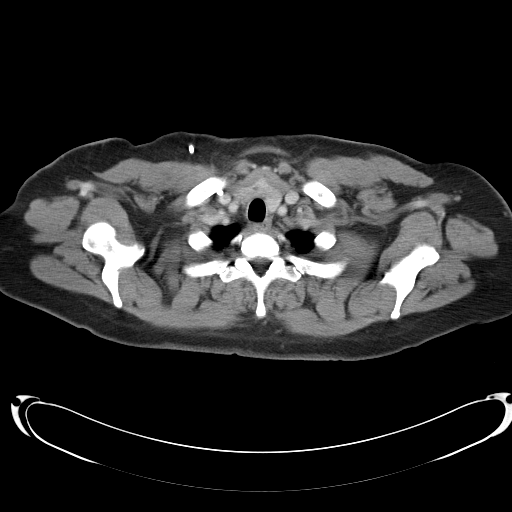

[Series 602: <mpr thick range> · coronal · 1.14mm/px · 3 of 99 slices shown]
[im 33/99  soft-tissue]
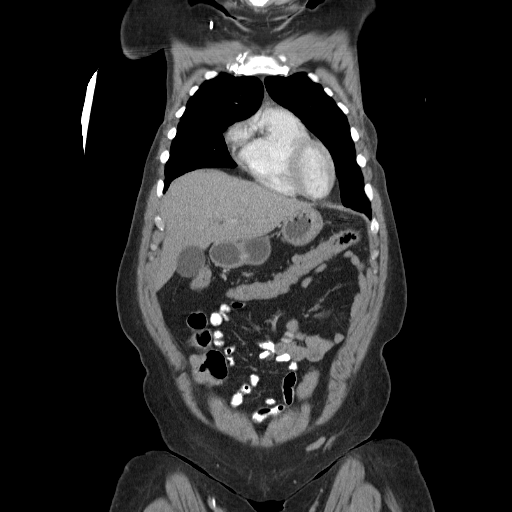
[im 44/99  soft-tissue]
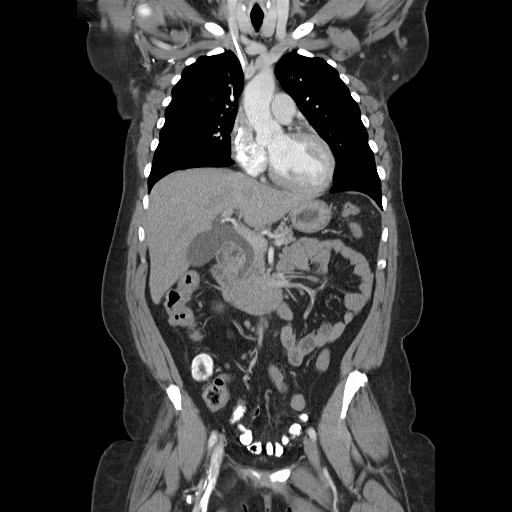
[im 55/99  soft-tissue]
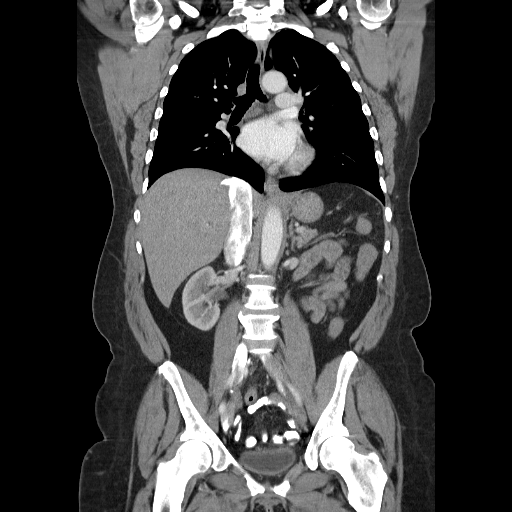

[17 of 46 positions shown; findings below may reference images not displayed]

FINDINGS: A lateral right subpectoral lymph node, as seen on
08/26/2009, is not readily visualized on the current study.  There
are surgical clips in the right axilla, with postop changes in the
anterior chest wall.  No pathologically enlarged mediastinal, hilar
or axillary lymph nodes.  No internal mammary adenopathy.  Heart
size normal.  No pericardial effusion.  There are small juxta
diaphragmatic lymph nodes, stable.

There are changes of radiation fibrosis in the anterior right
hemithorax.  A 5 mm nodule in the right lower lobe (image 31) is
stable.  No pleural fluid.  Airway is unremarkable.
IMPRESSION: High right lateral subpectoral lymph node is no longer visualized.
No evidence of metastatic disease in the chest.

CT ABDOMEN AND PELVIS
FINDINGS: Liver, gallbladder, adrenal glands, kidneys, spleen,
pancreas, stomach and bowel are unremarkable.  Uterus and ovaries
are visualized.  There is scattered atherosclerotic calcification
of the arterial vasculature.  No pathologically enlarged lymph
nodes.  No free fluid.  No worrisome lytic or sclerotic lesions.
IMPRESSION: No evidence of metastatic disease in the abdomen or pelvis.

## 2010-07-20 IMAGING — CR DG CHEST 2V
2 series · 2 of 2 positions shown · non-contrast
Comparison: Chest CT 03/01/2010.

CLINICAL DATA: Breast cancer.  Shortness breath.

CHEST - 2 VIEW

[w chest pa]
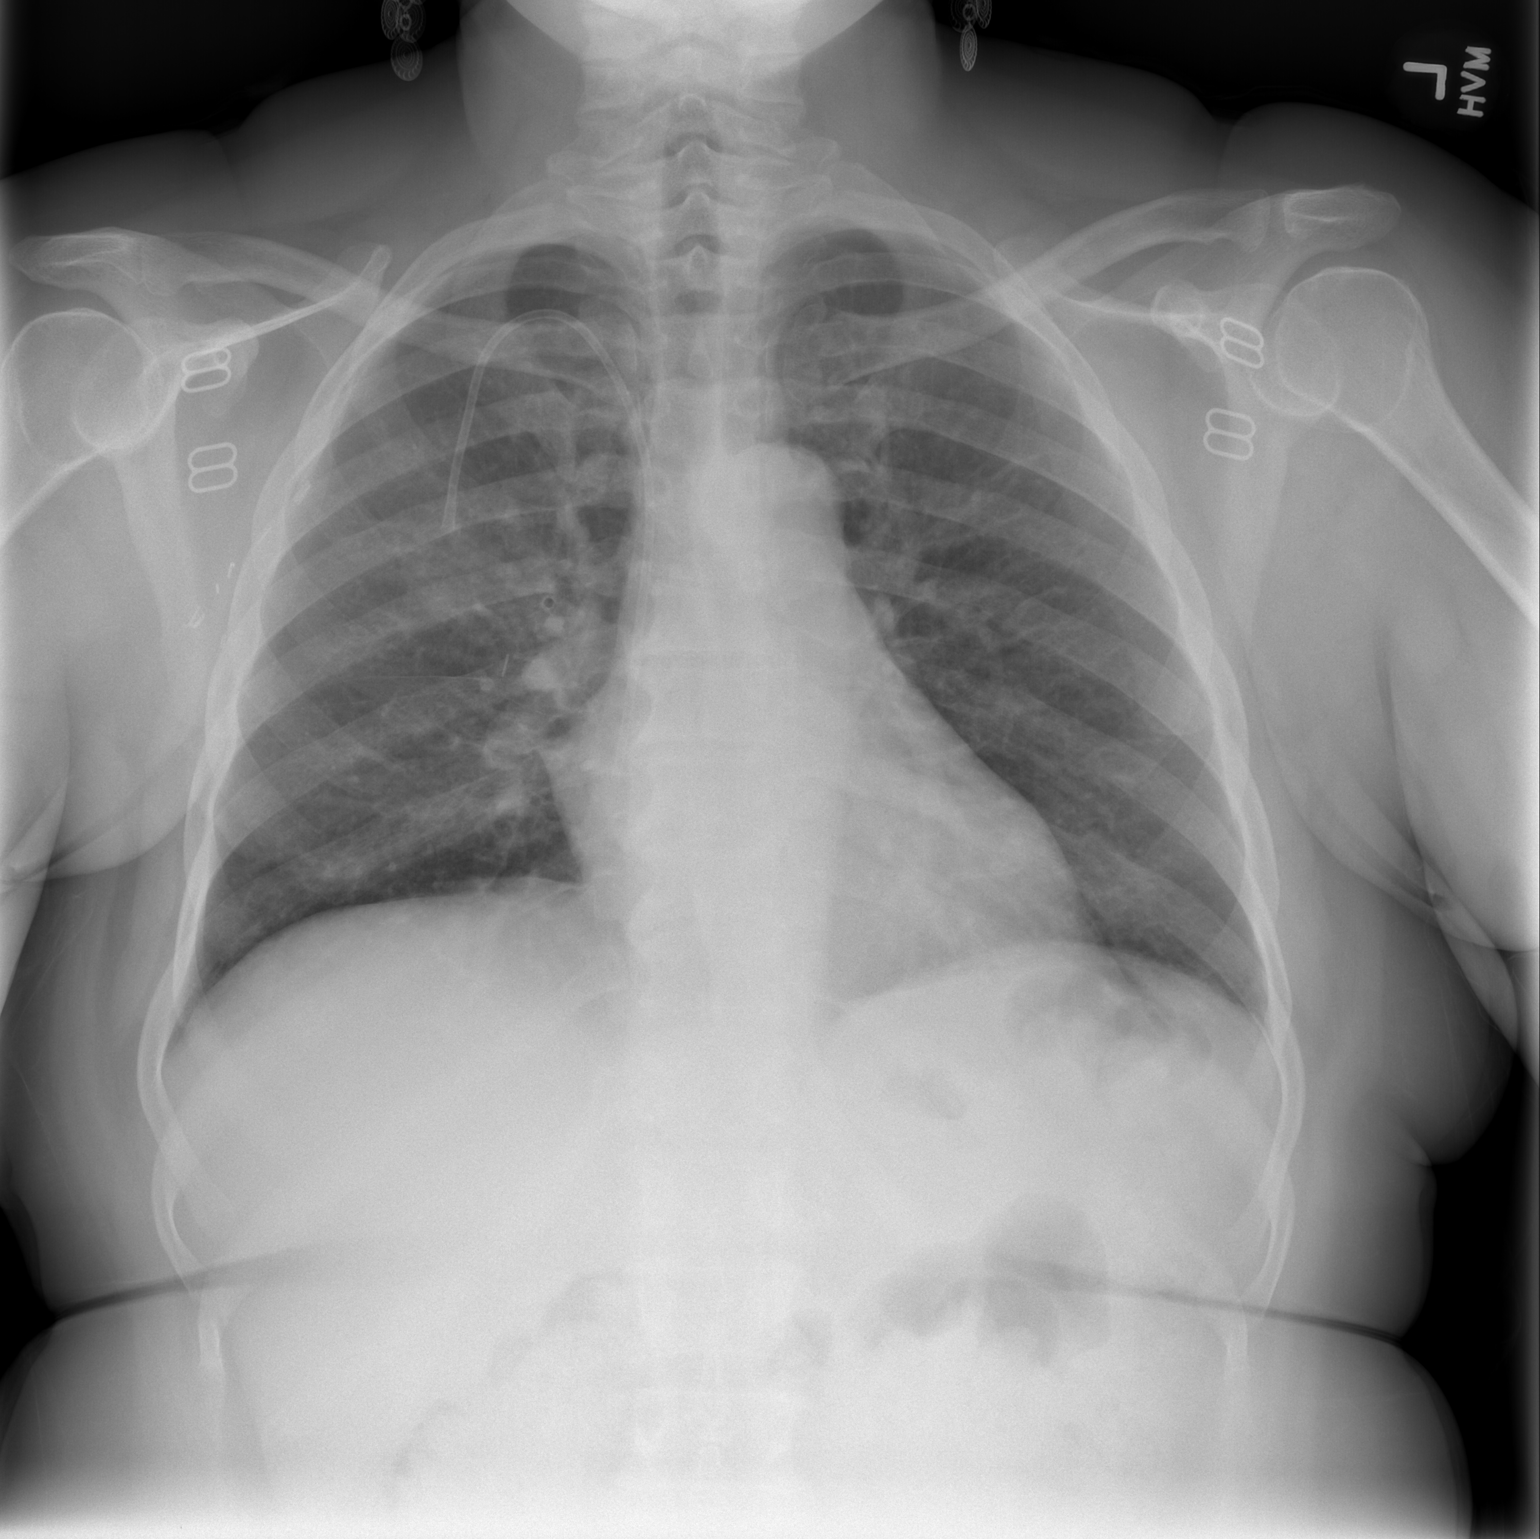

[w chest lat]
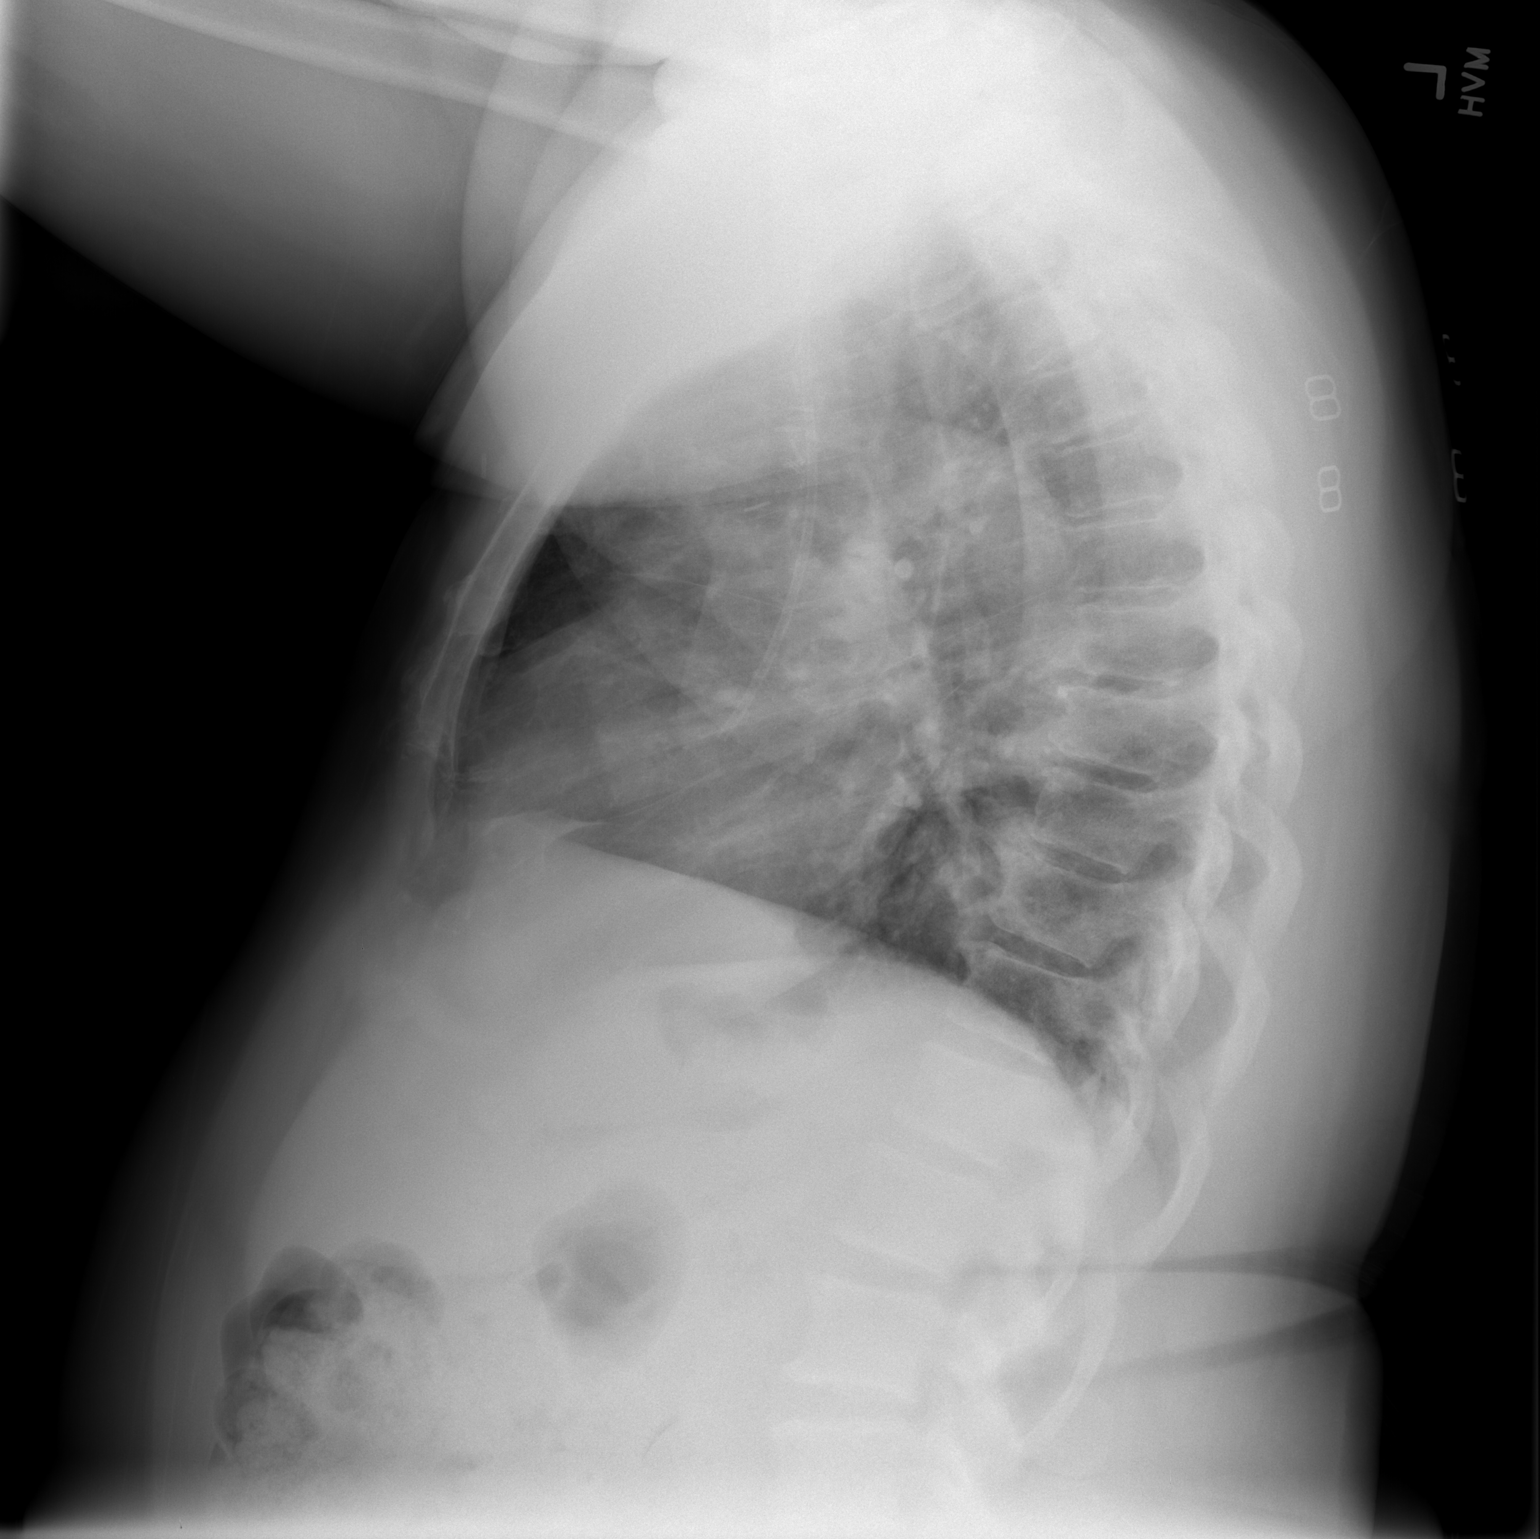

[2 of 2 positions shown; findings below may reference images not displayed]

FINDINGS: Right Mediport catheter tip right atrium.  Mild central
pulmonary vascular prominence.  Minimal peribronchial thickening
right hilum.  No segmental consolidation.  Post radiation therapy
type changes right lung and the right base nodule seen on chest CT
not appreciated as well on the present plain film examination.  No
pneumothorax.

Heart size within normal limits.
IMPRESSION: Pulmonary vascular prominence most notable centrally.

Peribronchial thickening right hilum.

## 2010-07-23 ENCOUNTER — Emergency Department (HOSPITAL_COMMUNITY): Admission: EM | Admit: 2010-07-23 | Discharge: 2010-07-23 | Payer: Self-pay | Admitting: Emergency Medicine

## 2010-07-23 ENCOUNTER — Ambulatory Visit: Payer: Self-pay | Admitting: Oncology

## 2010-07-28 LAB — CBC WITH DIFFERENTIAL/PLATELET
BASO%: 0.2 % (ref 0.0–2.0)
Basophils Absolute: 0 10*3/uL (ref 0.0–0.1)
EOS%: 1.2 % (ref 0.0–7.0)
Eosinophils Absolute: 0.1 10*3/uL (ref 0.0–0.5)
HCT: 43 % (ref 34.8–46.6)
HGB: 14.3 g/dL (ref 11.6–15.9)
LYMPH%: 18.8 % (ref 14.0–49.7)
MCH: 28.4 pg (ref 25.1–34.0)
MCHC: 33.3 g/dL (ref 31.5–36.0)
MCV: 85.3 fL (ref 79.5–101.0)
MONO#: 0.4 10*3/uL (ref 0.1–0.9)
MONO%: 4.5 % (ref 0.0–14.0)
NEUT#: 7 10*3/uL — ABNORMAL HIGH (ref 1.5–6.5)
NEUT%: 75.3 % (ref 38.4–76.8)
Platelets: 310 10*3/uL (ref 145–400)
RBC: 5.04 10*6/uL (ref 3.70–5.45)
RDW: 14.6 % — ABNORMAL HIGH (ref 11.2–14.5)
WBC: 9.3 10*3/uL (ref 3.9–10.3)
lymph#: 1.8 10*3/uL (ref 0.9–3.3)
nRBC: 0 % (ref 0–0)

## 2010-07-28 LAB — COMPREHENSIVE METABOLIC PANEL
ALT: 10 U/L (ref 0–35)
AST: 17 U/L (ref 0–37)
Albumin: 4.1 g/dL (ref 3.5–5.2)
Alkaline Phosphatase: 85 U/L (ref 39–117)
BUN: 15 mg/dL (ref 6–23)
CO2: 26 mEq/L (ref 19–32)
Calcium: 9.5 mg/dL (ref 8.4–10.5)
Chloride: 105 mEq/L (ref 96–112)
Creatinine, Ser: 0.84 mg/dL (ref 0.40–1.20)
Glucose, Bld: 99 mg/dL (ref 70–99)
Potassium: 3.9 mEq/L (ref 3.5–5.3)
Sodium: 141 mEq/L (ref 135–145)
Total Bilirubin: 0.3 mg/dL (ref 0.3–1.2)
Total Protein: 7 g/dL (ref 6.0–8.3)

## 2010-08-11 ENCOUNTER — Ambulatory Visit: Payer: Self-pay | Admitting: Cardiovascular Disease

## 2010-08-11 ENCOUNTER — Ambulatory Visit: Admission: RE | Admit: 2010-08-11 | Discharge: 2010-08-11 | Payer: Self-pay | Admitting: Oncology

## 2010-08-11 ENCOUNTER — Encounter: Payer: Self-pay | Admitting: Oncology

## 2010-08-17 ENCOUNTER — Ambulatory Visit (HOSPITAL_COMMUNITY): Admission: RE | Admit: 2010-08-17 | Discharge: 2010-08-17 | Payer: Self-pay | Admitting: Oncology

## 2010-08-18 LAB — COMPREHENSIVE METABOLIC PANEL
ALT: 12 U/L (ref 0–35)
AST: 17 U/L (ref 0–37)
Albumin: 4 g/dL (ref 3.5–5.2)
Alkaline Phosphatase: 85 U/L (ref 39–117)
BUN: 13 mg/dL (ref 6–23)
CO2: 30 mEq/L (ref 19–32)
Calcium: 9.2 mg/dL (ref 8.4–10.5)
Chloride: 103 mEq/L (ref 96–112)
Creatinine, Ser: 0.9 mg/dL (ref 0.40–1.20)
Glucose, Bld: 85 mg/dL (ref 70–99)
Potassium: 3.7 mEq/L (ref 3.5–5.3)
Sodium: 143 mEq/L (ref 135–145)
Total Bilirubin: 0.3 mg/dL (ref 0.3–1.2)
Total Protein: 6.9 g/dL (ref 6.0–8.3)

## 2010-08-18 LAB — CBC WITH DIFFERENTIAL/PLATELET
BASO%: 0.5 % (ref 0.0–2.0)
Basophils Absolute: 0 10*3/uL (ref 0.0–0.1)
EOS%: 3.2 % (ref 0.0–7.0)
Eosinophils Absolute: 0.2 10*3/uL (ref 0.0–0.5)
HCT: 39.1 % (ref 34.8–46.6)
HGB: 13.2 g/dL (ref 11.6–15.9)
LYMPH%: 21 % (ref 14.0–49.7)
MCH: 28.6 pg (ref 25.1–34.0)
MCHC: 33.8 g/dL (ref 31.5–36.0)
MCV: 84.8 fL (ref 79.5–101.0)
MONO#: 0.5 10*3/uL (ref 0.1–0.9)
MONO%: 7.4 % (ref 0.0–14.0)
NEUT#: 5 10*3/uL (ref 1.5–6.5)
NEUT%: 67.9 % (ref 38.4–76.8)
Platelets: 242 10*3/uL (ref 145–400)
RBC: 4.61 10*6/uL (ref 3.70–5.45)
RDW: 14.5 % (ref 11.2–14.5)
WBC: 7.4 10*3/uL (ref 3.9–10.3)
lymph#: 1.6 10*3/uL (ref 0.9–3.3)

## 2010-08-18 LAB — CANCER ANTIGEN 27.29: CA 27.29: 28 U/mL (ref 0–39)

## 2010-08-18 LAB — LACTATE DEHYDROGENASE: LDH: 181 U/L (ref 94–250)

## 2010-08-23 ENCOUNTER — Ambulatory Visit: Payer: Self-pay | Admitting: Oncology

## 2010-08-25 LAB — CBC WITH DIFFERENTIAL/PLATELET
BASO%: 0.2 % (ref 0.0–2.0)
Basophils Absolute: 0 10*3/uL (ref 0.0–0.1)
EOS%: 3.3 % (ref 0.0–7.0)
Eosinophils Absolute: 0.3 10*3/uL (ref 0.0–0.5)
HCT: 37.6 % (ref 34.8–46.6)
HGB: 12.1 g/dL (ref 11.6–15.9)
LYMPH%: 28 % (ref 14.0–49.7)
MCH: 28.1 pg (ref 25.1–34.0)
MCHC: 32.2 g/dL (ref 31.5–36.0)
MCV: 87.2 fL (ref 79.5–101.0)
MONO#: 0.6 10*3/uL (ref 0.1–0.9)
MONO%: 7.1 % (ref 0.0–14.0)
NEUT#: 5.4 10*3/uL (ref 1.5–6.5)
NEUT%: 61.4 % (ref 38.4–76.8)
Platelets: 283 10*3/uL (ref 145–400)
RBC: 4.31 10*6/uL (ref 3.70–5.45)
RDW: 14.5 % (ref 11.2–14.5)
WBC: 8.7 10*3/uL (ref 3.9–10.3)
lymph#: 2.4 10*3/uL (ref 0.9–3.3)

## 2010-09-08 LAB — CBC WITH DIFFERENTIAL/PLATELET
BASO%: 0.6 % (ref 0.0–2.0)
Basophils Absolute: 0 10*3/uL (ref 0.0–0.1)
EOS%: 2.1 % (ref 0.0–7.0)
Eosinophils Absolute: 0.2 10*3/uL (ref 0.0–0.5)
HCT: 39 % (ref 34.8–46.6)
HGB: 12.7 g/dL (ref 11.6–15.9)
LYMPH%: 23.7 % (ref 14.0–49.7)
MCH: 28.2 pg (ref 25.1–34.0)
MCHC: 32.6 g/dL (ref 31.5–36.0)
MCV: 86.7 fL (ref 79.5–101.0)
MONO#: 1 10*3/uL — ABNORMAL HIGH (ref 0.1–0.9)
MONO%: 13.8 % (ref 0.0–14.0)
NEUT#: 4.3 10*3/uL (ref 1.5–6.5)
NEUT%: 59.8 % (ref 38.4–76.8)
Platelets: 285 10*3/uL (ref 145–400)
RBC: 4.5 10*6/uL (ref 3.70–5.45)
RDW: 14.6 % — ABNORMAL HIGH (ref 11.2–14.5)
WBC: 7.2 10*3/uL (ref 3.9–10.3)
lymph#: 1.7 10*3/uL (ref 0.9–3.3)
nRBC: 0 % (ref 0–0)

## 2010-09-13 ENCOUNTER — Emergency Department (HOSPITAL_COMMUNITY): Admission: EM | Admit: 2010-09-13 | Discharge: 2010-09-13 | Payer: Self-pay | Admitting: Emergency Medicine

## 2010-09-15 ENCOUNTER — Inpatient Hospital Stay (HOSPITAL_COMMUNITY): Admission: EM | Admit: 2010-09-15 | Discharge: 2010-09-16 | Payer: Self-pay | Admitting: Emergency Medicine

## 2010-10-04 ENCOUNTER — Ambulatory Visit: Payer: Self-pay | Admitting: Oncology

## 2010-10-06 LAB — COMPREHENSIVE METABOLIC PANEL
ALT: 10 U/L (ref 0–35)
AST: 19 U/L (ref 0–37)
Albumin: 4 g/dL (ref 3.5–5.2)
Alkaline Phosphatase: 92 U/L (ref 39–117)
BUN: 21 mg/dL (ref 6–23)
CO2: 30 mEq/L (ref 19–32)
Calcium: 9 mg/dL (ref 8.4–10.5)
Chloride: 98 mEq/L (ref 96–112)
Creatinine, Ser: 1.04 mg/dL (ref 0.40–1.20)
Glucose, Bld: 100 mg/dL — ABNORMAL HIGH (ref 70–99)
Potassium: 3.9 mEq/L (ref 3.5–5.3)
Sodium: 142 mEq/L (ref 135–145)
Total Bilirubin: 0.2 mg/dL — ABNORMAL LOW (ref 0.3–1.2)
Total Protein: 6.8 g/dL (ref 6.0–8.3)

## 2010-10-06 LAB — CBC WITH DIFFERENTIAL/PLATELET
BASO%: 0.8 % (ref 0.0–2.0)
Basophils Absolute: 0.1 10*3/uL (ref 0.0–0.1)
EOS%: 4.9 % (ref 0.0–7.0)
Eosinophils Absolute: 0.3 10*3/uL (ref 0.0–0.5)
HCT: 35.8 % (ref 34.8–46.6)
HGB: 11.7 g/dL (ref 11.6–15.9)
LYMPH%: 37.3 % (ref 14.0–49.7)
MCH: 28.3 pg (ref 25.1–34.0)
MCHC: 32.7 g/dL (ref 31.5–36.0)
MCV: 86.5 fL (ref 79.5–101.0)
MONO#: 0.7 10*3/uL (ref 0.1–0.9)
MONO%: 11.4 % (ref 0.0–14.0)
NEUT#: 2.9 10*3/uL (ref 1.5–6.5)
NEUT%: 45.6 % (ref 38.4–76.8)
Platelets: 214 10*3/uL (ref 145–400)
RBC: 4.14 10*6/uL (ref 3.70–5.45)
RDW: 15.6 % — ABNORMAL HIGH (ref 11.2–14.5)
WBC: 6.3 10*3/uL (ref 3.9–10.3)
lymph#: 2.4 10*3/uL (ref 0.9–3.3)
nRBC: 0 % (ref 0–0)

## 2010-10-06 LAB — CANCER ANTIGEN 27.29: CA 27.29: 21 U/mL (ref 0–39)

## 2010-10-20 LAB — CBC WITH DIFFERENTIAL/PLATELET
BASO%: 0.7 % (ref 0.0–2.0)
Basophils Absolute: 0.1 10*3/uL (ref 0.0–0.1)
EOS%: 2.7 % (ref 0.0–7.0)
Eosinophils Absolute: 0.2 10*3/uL (ref 0.0–0.5)
HCT: 39.9 % (ref 34.8–46.6)
HGB: 13.5 g/dL (ref 11.6–15.9)
LYMPH%: 27.5 % (ref 14.0–49.7)
MCH: 28.7 pg (ref 25.1–34.0)
MCHC: 33.8 g/dL (ref 31.5–36.0)
MCV: 84.9 fL (ref 79.5–101.0)
MONO#: 0.5 10*3/uL (ref 0.1–0.9)
MONO%: 6.8 % (ref 0.0–14.0)
NEUT#: 4.4 10*3/uL (ref 1.5–6.5)
NEUT%: 62.3 % (ref 38.4–76.8)
Platelets: 339 10*3/uL (ref 145–400)
RBC: 4.7 10*6/uL (ref 3.70–5.45)
RDW: 15.2 % — ABNORMAL HIGH (ref 11.2–14.5)
WBC: 7 10*3/uL (ref 3.9–10.3)
lymph#: 1.9 10*3/uL (ref 0.9–3.3)
nRBC: 0 % (ref 0–0)

## 2010-11-03 ENCOUNTER — Ambulatory Visit
Admission: RE | Admit: 2010-11-03 | Discharge: 2010-11-03 | Payer: Self-pay | Source: Home / Self Care | Attending: General Surgery | Admitting: General Surgery

## 2010-11-09 IMAGING — CR DG THORACIC SPINE 2V
4 series · 4 of 4 positions shown · non-contrast
Comparison: Chest radiograph 03/06/2010

CLINICAL DATA: Back pain, fall

THORACIC SPINE - 3 VIEWS

[t t-spine a.p. *]
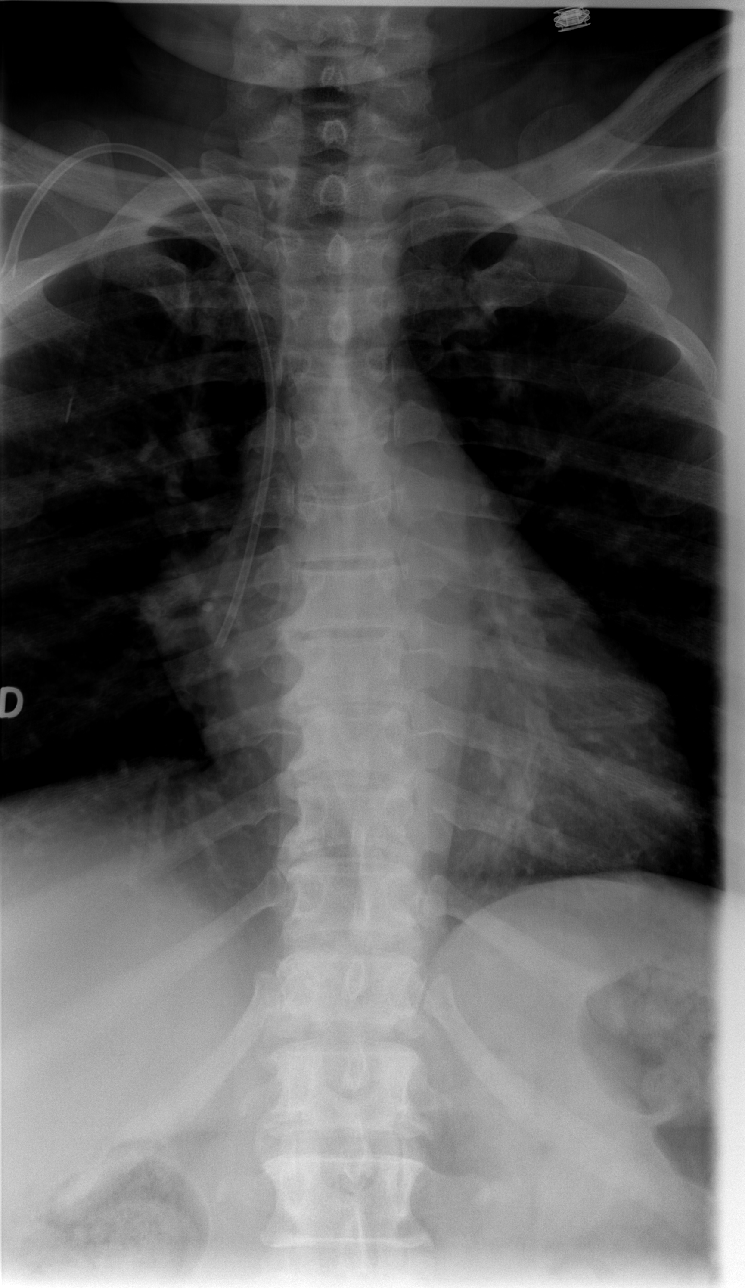

[t t-spine lat *]
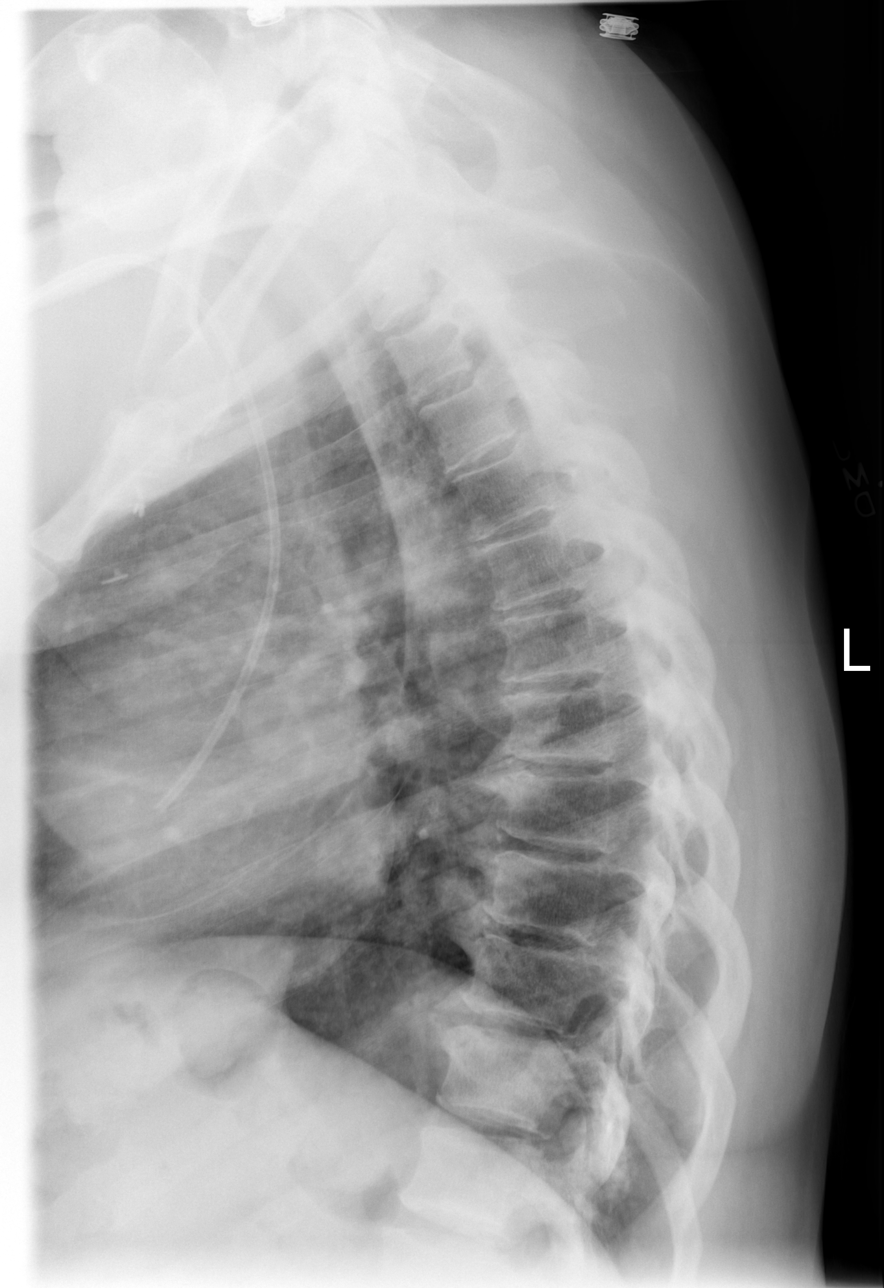

[t swimmers * (1 of 2)]
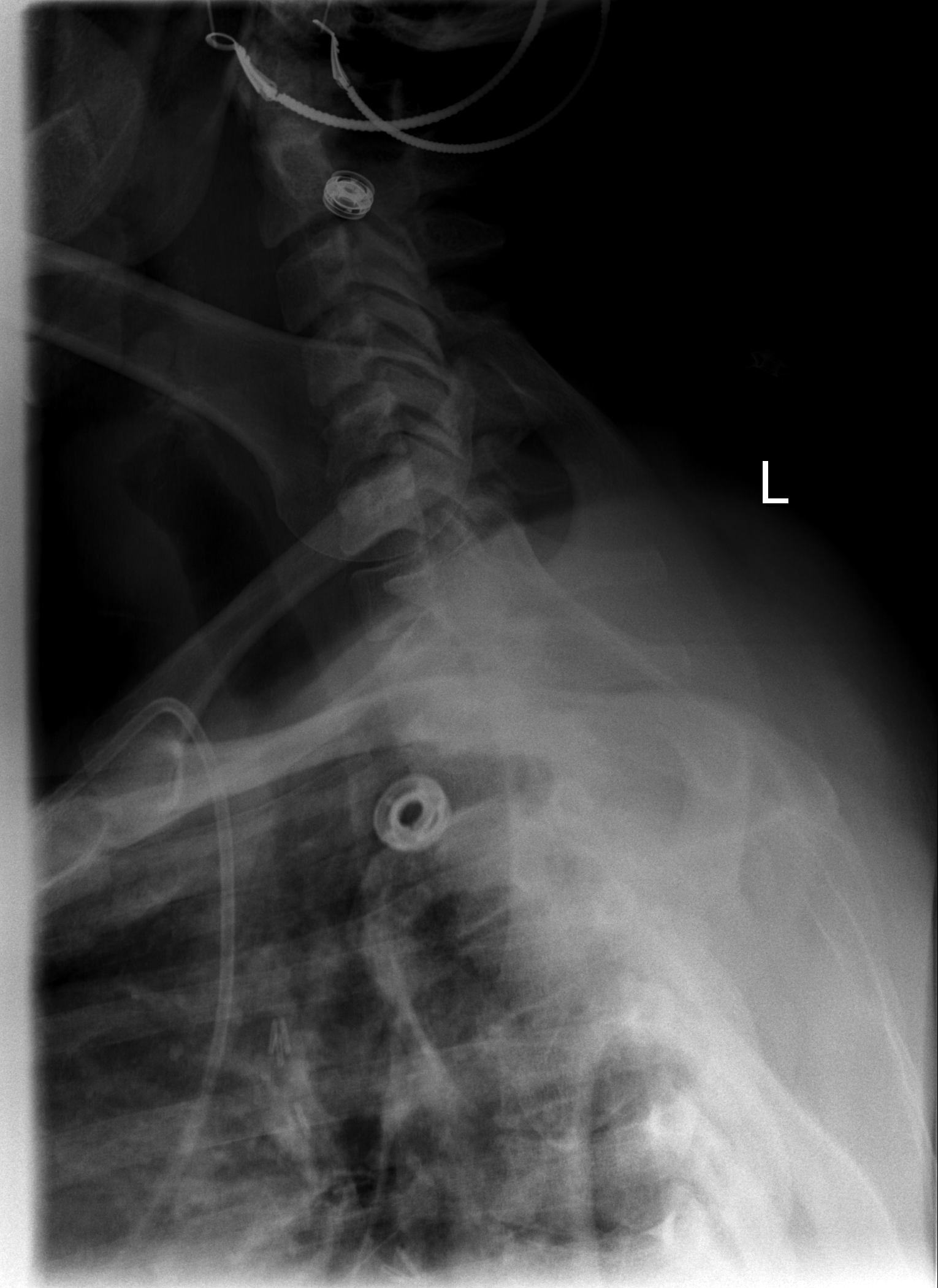

[t swimmers * (2 of 2)]
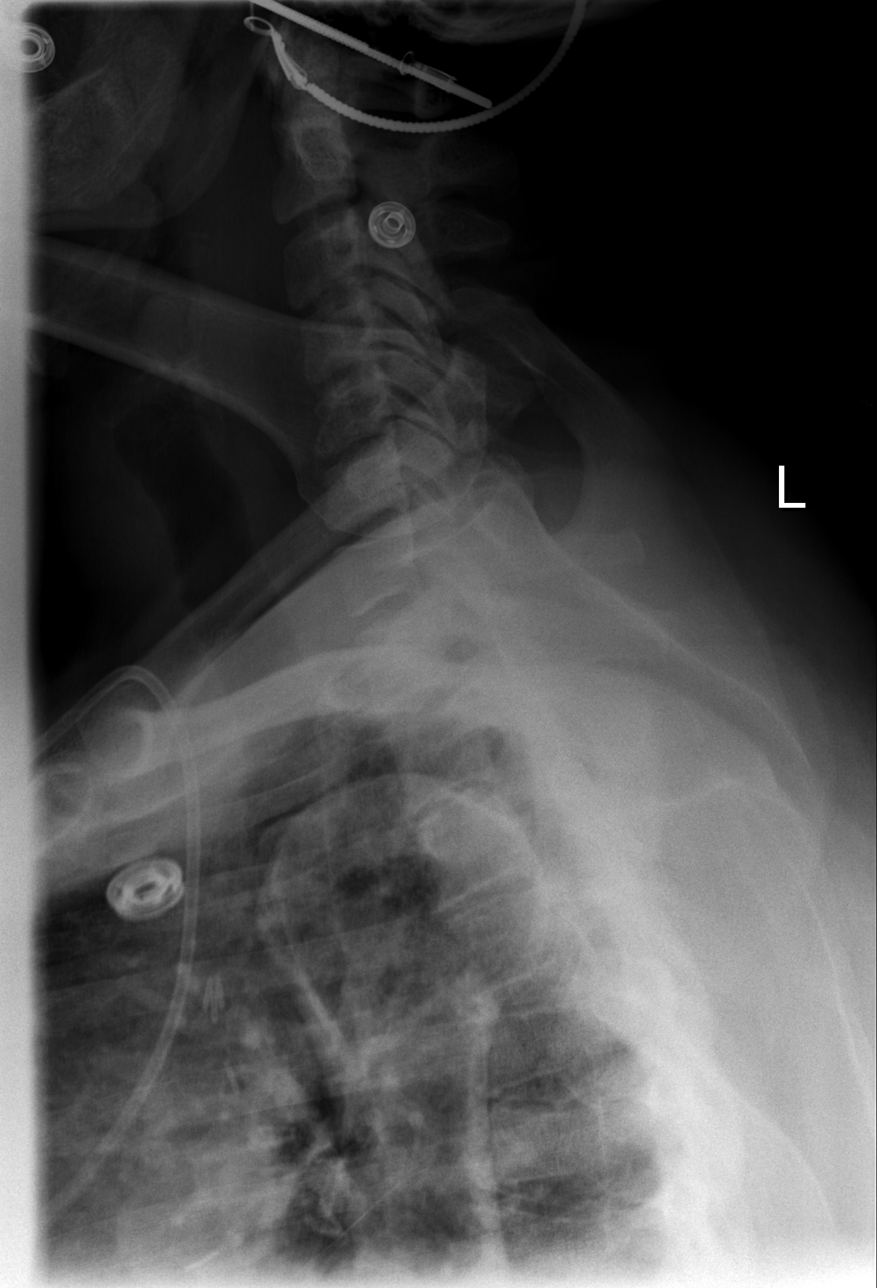

[4 of 4 positions shown; findings below may reference images not displayed]

FINDINGS: Tip of central line near cavoatrial junction.
12 pairs of ribs.
Scattered end plate spur formation thoracic spine.
Vertebral body heights maintained without fracture or subluxation.
IMPRESSION: Degenerative disc disease changes thoracic spine.
No acute thoracic spine abnormalities.

## 2010-11-10 ENCOUNTER — Ambulatory Visit (HOSPITAL_BASED_OUTPATIENT_CLINIC_OR_DEPARTMENT_OTHER): Payer: Medicare Other | Admitting: Oncology

## 2010-11-11 LAB — CBC WITH DIFFERENTIAL/PLATELET
BASO%: 0.5 % (ref 0.0–2.0)
Basophils Absolute: 0 10*3/uL (ref 0.0–0.1)
EOS%: 4.3 % (ref 0.0–7.0)
Eosinophils Absolute: 0.3 10*3/uL (ref 0.0–0.5)
HCT: 39.2 % (ref 34.8–46.6)
HGB: 12.9 g/dL (ref 11.6–15.9)
LYMPH%: 23.4 % (ref 14.0–49.7)
MCH: 28.4 pg (ref 25.1–34.0)
MCHC: 32.9 g/dL (ref 31.5–36.0)
MCV: 86.2 fL (ref 79.5–101.0)
MONO#: 0.9 10*3/uL (ref 0.1–0.9)
MONO%: 11.9 % (ref 0.0–14.0)
NEUT#: 4.5 10*3/uL (ref 1.5–6.5)
NEUT%: 59.9 % (ref 38.4–76.8)
Platelets: 260 10*3/uL (ref 145–400)
RBC: 4.55 10*6/uL (ref 3.70–5.45)
RDW: 15.7 % — ABNORMAL HIGH (ref 11.2–14.5)
WBC: 7.5 10*3/uL (ref 3.9–10.3)
lymph#: 1.7 10*3/uL (ref 0.9–3.3)
nRBC: 0 % (ref 0–0)

## 2010-11-18 ENCOUNTER — Emergency Department (HOSPITAL_COMMUNITY)
Admission: EM | Admit: 2010-11-18 | Discharge: 2010-11-18 | Payer: Self-pay | Source: Home / Self Care | Admitting: Emergency Medicine

## 2010-11-28 ENCOUNTER — Encounter: Payer: Self-pay | Admitting: Internal Medicine

## 2010-11-28 ENCOUNTER — Encounter: Payer: Self-pay | Admitting: Cardiology

## 2010-11-28 ENCOUNTER — Encounter: Payer: Self-pay | Admitting: General Surgery

## 2010-11-28 ENCOUNTER — Encounter: Payer: Self-pay | Admitting: Oncology

## 2010-11-29 ENCOUNTER — Encounter: Payer: Self-pay | Admitting: Oncology

## 2010-12-07 NOTE — Letter (Signed)
Summary: [facility] records  [facility] records   Imported By: Kassie Mends 09/17/2009 11:17:53  _____________________________________________________________________  External Attachment:    Type:   Image     Comment:   External Document

## 2010-12-07 NOTE — Miscellaneous (Signed)
Summary: Orders Update  Clinical Lists Changes  Orders: Added new Referral order of Stress Echo (Stress Echo) - Signed Observations: Added new observation of PI CARDIO: Your physician recommends that you continue on your current medications as directed. Please refer to the Current Medication list given to you today. Your physician recommends a low cholesterol, low fat diet.  You have been diagnosed with Congestive Heart Failure or CHF.  CHF is a condition in which a problem with the structure or function of the heart impairs its ability to supply sufficient blood flow to meet the body's needs.  For further information please visit www.cardiosmart.org for detailed information on CHF. Your physician recommends that you weigh, daily, at the same time every day, and in the same amount of clothing.  Please record your daily weights on the handout provided and bring it to your next appointment. (09/22/2009 13:54)  Pt has been councelled  by Dr Antoine Poche about the need to stop smoking.    Patient Instructions: 1)  Your physician recommends that you continue on your current medications as directed. Please refer to the Current Medication list given to you today. 2)  Your physician recommends a low cholesterol, low fat diet.  3)  You have been diagnosed with Congestive Heart Failure or CHF.  CHF is a condition in which a problem with the structure or function of the heart impairs its ability to supply sufficient blood flow to meet the body's needs.  For further information please visit www.cardiosmart.org for detailed information on CHF. 4)  Your physician recommends that you weigh, daily, at the same time every day, and in the same amount of clothing.  Please record your daily weights on the handout provided and bring it to your next appointment.

## 2010-12-07 NOTE — Progress Notes (Signed)
Summary: stress test  order for Dob Echo  Phone Note From Other Clinic   Caller: Nurse Summary of Call: Per Victorino December stress test was cancelled due to tech  unable to access veins for meds. Pt has Breast Cancer. Pt has port a cath. Do IV in pt feet called IV team and still was unable to get vein.  Initial call taken by: Edman Circle,  September 10, 2009 11:23 AM  Follow-up for Phone Call        per Dr Aime Carreras's order, he wants pt to have a Dobutamine Echo for chest pain and OK for tech to use port a cath for injection.  Will sent order to [facility] for scheduling Follow-up by: Charolotte Capuchin, RN,  September 10, 2009 2:30 PM  Additional Follow-up for Phone Call Additional follow up Details #1::        The patient wants an RN to start the IV in her foot prior to her stress persusion study.  We will see if we can arrange this at [facility].  She needs Tussionex for cough and we will arrange this.  I will reduce her Norvasc to 5 mg daily and start Coreg 3.12 mg two times a day.  ([facility] on [address])  New Problems: CHEST PAIN UNSPECIFIED (ICD-786.50)   New Problems: CHEST PAIN UNSPECIFIED (ICD-786.50)  Appended Document: stress test  order for Dob Echo 09/11/09 12 noon--called in to [facility] on [address]--tussinez 427 ml bottle--take q 12 hours for cough--no refills norvasc 5mg  1 tab once daily #30 with 10 refills coreg 3.125mg  1 tab b.i.d. #60 with 10 refills

## 2010-12-07 NOTE — Progress Notes (Signed)
Summary: NEED CHEAPER COUGH MEDICATION  Phone Note Refill Request Call back at [phone] Message from:  Patient on December 11, 2009 11:28 AM  DR.Briany Aye CALLED IN A MEDICATION FOR THE PT  AND THE PT WOULD LIKE A CHEAPER MEDICATION THE TUSSIONEX COST TO MUCH SHE WANTS PROMETH CALLED TO [place] [phone]  Initial call taken by: Judie Grieve,  December 11, 2009 11:34 AM  Follow-up for Phone Call        [facility] for pt to call back. We denied Rx in December and told pharmacy to send Rx to pcp. Marrion Coy, CNA  December 11, 2009 1:19 PM  Follow-up by: Marrion Coy, CNA,  December 11, 2009 1:19 PM  Additional Follow-up for Phone Call Additional follow up Details #1::        Left several messages for pt to call back. pt never returned call. Marrion Coy, CNA  December 16, 2009 9:17 AM  Additional Follow-up by: Marrion Coy, CNA,  December 16, 2009 9:17 AM

## 2010-12-07 NOTE — Progress Notes (Signed)
Summary: mdeication questions  Phone Note Call from Patient Call back at Home Phone [phone]   Caller: Patient Summary of Call: pt calling about some meds changes Initial call taken by: Judie Grieve,  September 11, 2009 3:07 PM  Follow-up for Phone Call        NA [name] Dung, RN, BSN  September 11, 2009 3:45 PM  11/5  5:15--spoke with family member( husband or son) and asked him to let ms cox know we called in new meds to [facility] on [address] Ledon Snare, RN  September 11, 2009 5:18 PM

## 2010-12-07 NOTE — Letter (Signed)
[facility]   Imported By: Roderic Ovens 01/01/2010 10:48:22  _____________________________________________________________________  External Attachment:    Type:   Image     Comment:   External Document

## 2010-12-07 NOTE — Letter (Signed)
Summary: [facility] Progress Note  [facility] Progress Note   Imported By: Roderic Ovens 10/22/2009 13:09:03  _____________________________________________________________________  External Attachment:    Type:   Image     Comment:   External Document

## 2010-12-07 NOTE — Progress Notes (Signed)
Summary: chest pain  Phone Note Call from Patient Call back at Home Phone [phone] Call back at [phone]   Caller: Patient Reason for Call: Talk to Nurse Summary of Call: requesting return call...  had chest pain after stress test yesterday... no chest pain today, low bp yesterday Initial call taken by: Migdalia Dk,  October 14, 2009 11:58 AM  Follow-up for Phone Call        FELT LIKE HER USUAL PAIN. BP WAS 123/81. FEELS FINE NOW. NO COMPLAINTS.

## 2010-12-07 NOTE — Progress Notes (Signed)
Summary: stress test/ needs appt  Phone Note Call from Patient Call back at Home Phone [phone]   Caller: Patient Reason for Call: Talk to Nurse Summary of Call: wants to know if stress test has been scheduled at [facility] Initial call taken by: Migdalia Dk,  September 16, 2009 2:55 PM  Follow-up for Phone Call         I spoke with the pt and made her aware that the IV team had attempted to get an IV started in her foot for her test.  The IV team could not get a line started.  The pt does have a porta-cath that is used for chemotherapy.  In order for the pt to have the test Dr Antoine Poche ordered she would have to get either Adenosine or Dobutamine put through her porta-cath.  I called the main pharmacy at [facility] and they said that both Adenosine and Dobutamine can be pushed through a porta-cath.  The pt does not want her porta-cath used.  I did talk with the pt about a cardiac catheterization.  I told the pt what this procedure involves to see if she would be interested in this type of testing.  I told her that Dr Antoine Poche would have to order this test if he felt it was appropriate for the patient.   The pt said she would have this test if Dr Antoine Poche ordered it. Follow-up by: Julieta Gutting, RN, BSN,  September 16, 2009 3:32 PM  Additional Follow-up for Phone Call Additional follow up Details #1::        I need to see her in follow up to discuss the options. She cannot have a stress test because of vascular access  per Dr Antoine Poche. Requested Georga Kaufmann schedule an appt with [facility]  APPT SCHEDULED WITH [facility] FOR PT TO DISCUSS OPTIONS Additional Follow-up by: Charolotte Capuchin, RN,  September 18, 2009 12:17 PM

## 2010-12-07 NOTE — Miscellaneous (Signed)
  Clinical Lists Changes  Observations: Added new observation of ECHOINTERP: 1. Left ventricle: Septal and apical hypokinesis. Findings        consistant with ventricular non-compaction. The cavity size was        mildly dilated. Wall thickness was normal. The estimated ejection        fraction was 45%.     2. Aortic valve: Trivial regurgitation. Valve area: 2.58cm 2(VTI).        Valve area: 2.63cm 2 (Vmax).     3. Left atrium: The atrium was mildly dilated.     4. Atrial septum: No defect or patent foramen ovale was identified.     5. Pulmonary arteries: PA peak pressure: 32mm Hg (S).     6. Impressions: Consider cardiac MRI to further assess scar and        non-compaction of LV     Impressions:            - Consider cardiac MRI to further assess scar and non-compaction of       LV (08/10/2009 12:24)      Echocardiogram  Procedure date:  08/10/2009  Findings:      1. Left ventricle: Septal and apical hypokinesis. Findings        consistant with ventricular non-compaction. The cavity size was        mildly dilated. Wall thickness was normal. The estimated ejection        fraction was 45%.     2. Aortic valve: Trivial regurgitation. Valve area: 2.58cm 2(VTI).        Valve area: 2.63cm 2 (Vmax).     3. Left atrium: The atrium was mildly dilated.     4. Atrial septum: No defect or patent foramen ovale was identified.     5. Pulmonary arteries: PA peak pressure: 32mm Hg (S).     6. Impressions: Consider cardiac MRI to further assess scar and        non-compaction of LV     Impressions:            - Consider cardiac MRI to further assess scar and non-compaction of       LV

## 2010-12-07 NOTE — Letter (Signed)
[facility]   Imported By: Roderic Ovens 01/05/2010 11:12:18  _____________________________________________________________________  External Attachment:    Type:   Image     Comment:   External Document

## 2010-12-29 ENCOUNTER — Inpatient Hospital Stay (INDEPENDENT_AMBULATORY_CARE_PROVIDER_SITE_OTHER)
Admission: RE | Admit: 2010-12-29 | Discharge: 2010-12-29 | Disposition: A | Discharge status: Home / Self Care | Payer: Medicare Other | Source: Ambulatory Visit | Attending: Emergency Medicine | Admitting: Emergency Medicine

## 2010-12-29 ENCOUNTER — Ambulatory Visit (INDEPENDENT_AMBULATORY_CARE_PROVIDER_SITE_OTHER): Payer: Medicare Other

## 2010-12-29 ENCOUNTER — Ambulatory Visit (HOSPITAL_COMMUNITY): Admission: RE | Admit: 2010-12-29 | Payer: Medicare Other | Source: Ambulatory Visit

## 2010-12-29 DIAGNOSIS — J069 Acute upper respiratory infection, unspecified: Secondary | ICD-10-CM

## 2010-12-29 LAB — POCT URINALYSIS DIPSTICK
Hgb urine dipstick: NEGATIVE
Nitrite: NEGATIVE
Protein, ur: 100 mg/dL — AB
Specific Gravity, Urine: 1.02 (ref 1.005–1.030)
Urine Glucose, Fasting: NEGATIVE mg/dL
Urobilinogen, UA: 0.2 mg/dL (ref 0.0–1.0)
pH: 7 (ref 5.0–8.0)

## 2010-12-31 IMAGING — PT NM PET TUM IMG RESTAG (PS) SKULL BASE T - THIGH
6 series · 25 of 25 positions shown · non-contrast
Comparison: Today's diagnostic CTs of the chest, abdomen, pelvis,
dictated separately.  The prior PET and CTs of 03/01/2010.

CLINICAL DATA: Subsequent treatment strategy for breast cancer.
Status post bilateral mastectomy in 0336.  Last chemotherapy [DATE].

NUCLEAR MEDICINE PET CT SKULL BASE TO THIGH
TECHNIQUE: 17.6 mCi F-18 FDG was injected intravenously via the
left foot.  Full-ring PET imaging was performed from the skull base
through the mid-thighs 60  minutes after injection.  CT data was
obtained and used for attenuation correction and anatomic
localization only.  (This was not acquired as a diagnostic CT
examination.)
Fasting Blood Glucose:  115

[Series 1: pet ac · axial · 3.3mm · 4.69mm/px · z∈[-870,+0]mm · 5 of 267 slices shown]
[im 1/267]
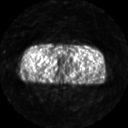
[im 67/267]
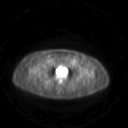
[im 134/267]
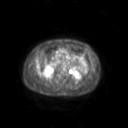
[im 200/267]
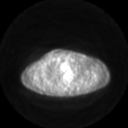
[im 267/267]
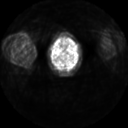

[Series 2: pet nac · axial · 3.3mm · 4.69mm/px · z∈[-870,+0]mm · 6 of 267 slices shown]
[im 1/267]
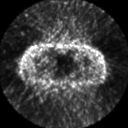
[im 54/267]
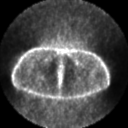
[im 107/267]
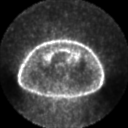
[im 160/267]
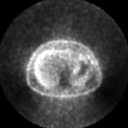
[im 213/267]
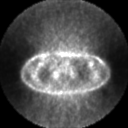
[im 267/267]
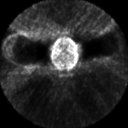

[Series 2: ct images · axial · 3.8mm · 0.98mm/px · z∈[-870,+0]mm · 6 of 267 slices shown]
[im 1/267]
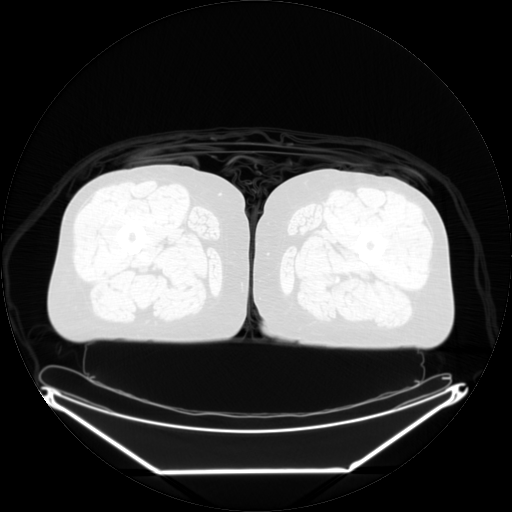
[im 54/267]
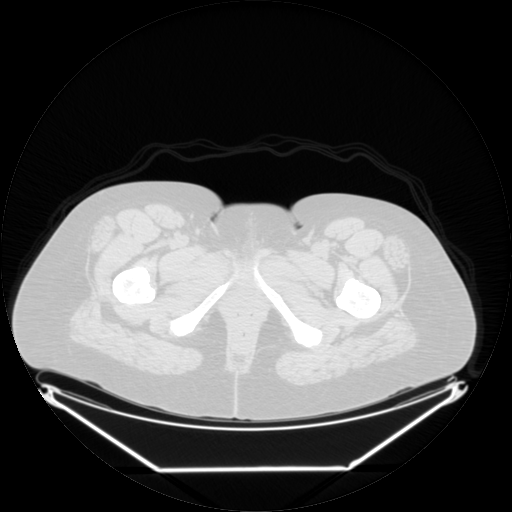
[im 107/267]
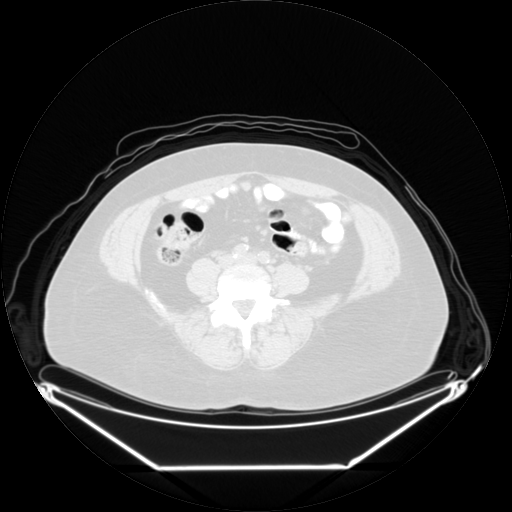
[im 160/267]
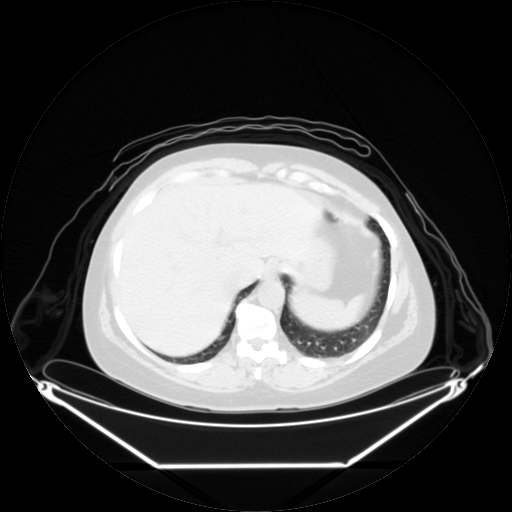
[im 213/267]
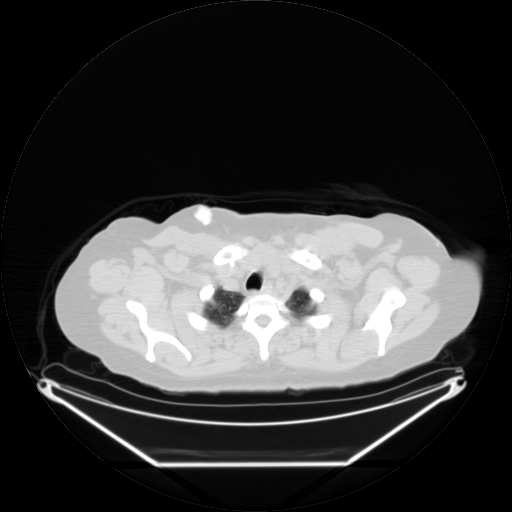
[im 267/267  brain]
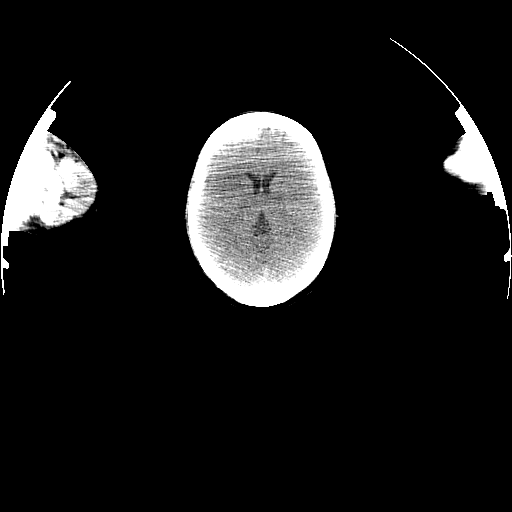

[Series 123: mip · coronal · 3.3mm · 4.69mm/px · 1 of 30 slices shown]
[im 1/30]
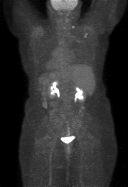

[Series 151: reformatted · axial · 3.3mm · 3.91mm/px · z∈[-870,+0]mm · 6 of 265 slices shown (1 of 2)]
[im 1/265]
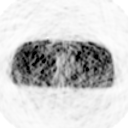
[im 53/265]
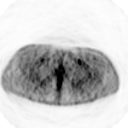
[im 106/265]
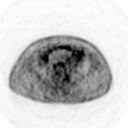
[im 159/265]
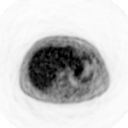
[im 212/265]
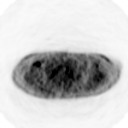
[im 265/265]
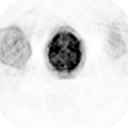

[Series 153: reformatted · coronal · 4.7mm · 6.98mm/px · 1 of 65 slices shown (2 of 2)]
[im 1/65]
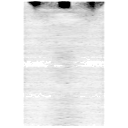

[25 of 25 positions shown; findings below may reference images not displayed]

FINDINGS: PET images demonstrate a right supraclavicular lymph node
which measures 7 mm and a S.U.V. max of 6.2 on image 40.  2 foci of
right axillary hypermetabolism.  The larger node measures 1.0 cm
and a S.U.V. max of 5.9 on image 53.

Mild hypermetabolism is felt to correspond to a right subpectoral
lymph which measures 9 mm and a S.U.V. max of 3.0 on image 61 .
This finding is equivocal.

Stable prominent bilateral inguinal lymph nodes which demonstrate
low level nonmalignant range activity and are likely physiologic.

CT images performed for attenuation correction demonstrate no
significant findings within the neck.  Chest, abdomen, pelvic
findings deferred to diagnostic CTs, dictated separately.
IMPRESSION: 1.  Newly hypermetabolic right supraclavicular and axillary lymph
nodes, most consistent disease progression/recurrence.
2.  More equivocal but still suspicious right subpectoral
metabolism, likely concurrent with a prominent node.
3.  No evidence of hypermetabolic abdominal/pelvic disease.

## 2010-12-31 IMAGING — CT CT CHEST W/ CM
2 of 5 series · 16 of 46 positions shown, 18 images · IV contrast (agent unspecified)
Comparison: Today's PET, dictated separately.  Prior exams,
including CTs and PET of 03/01/2010.

CT CHEST

CLINICAL DATA: Bilateral breast cancer diagnosed in [DATE].
Chemotherapy in progress.  Prior radiation therapy.

CT CHEST, ABDOMEN AND PELVIS WITH CONTRAST
TECHNIQUE: Contiguous axial images of the chest abdomen and pelvis
were obtained after IV contrast administration.
Contrast: 100  ml Jmnipaque-JPP

[Series 2: cap with st · axial · 0.81mm/px · z∈[+224,+734]mm · 13 of 116 slices shown, 15 images]
[im 7/116  soft-tissue]
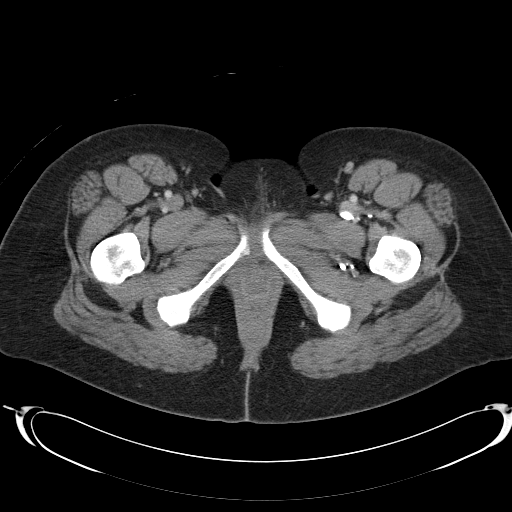
[im 7/116  bone]
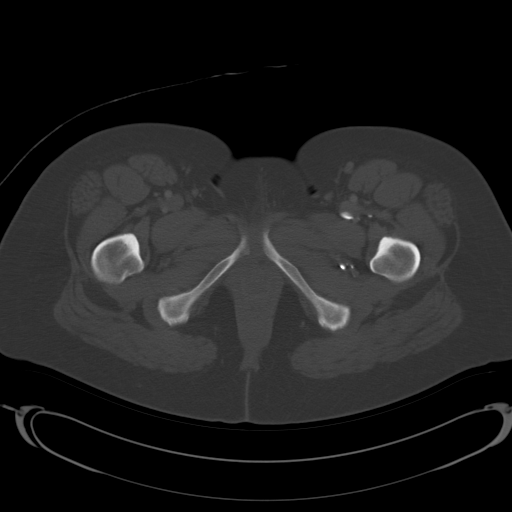
[im 13/116  soft-tissue]
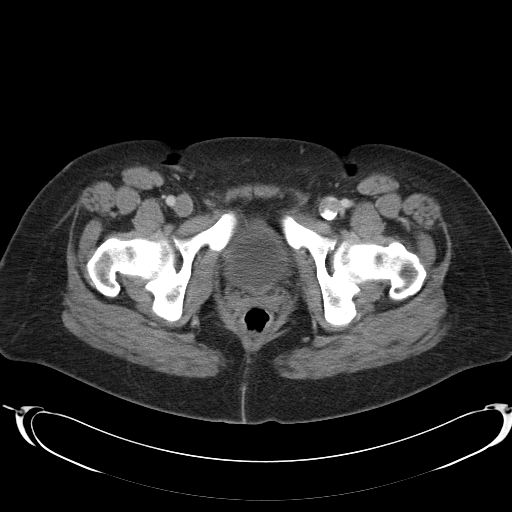
[im 26/116  soft-tissue]
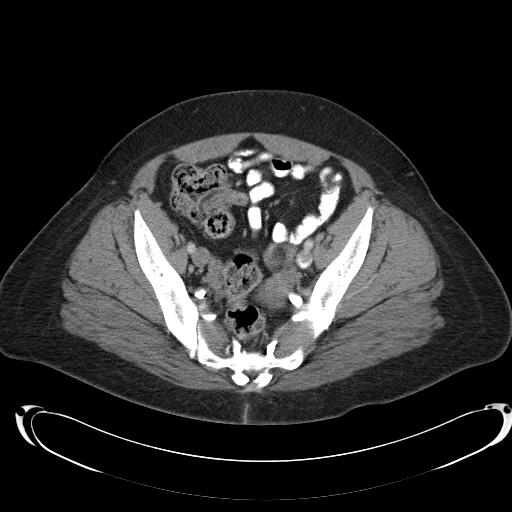
[im 32/116  soft-tissue]
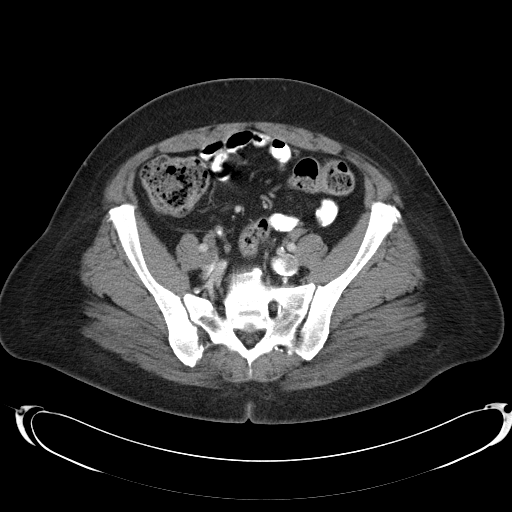
[im 39/116  soft-tissue]
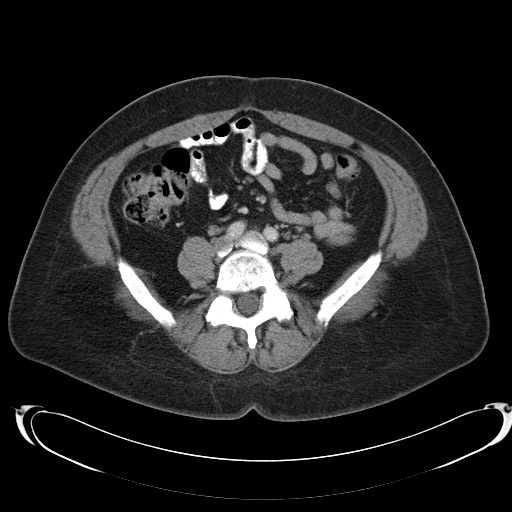
[im 52/116  soft-tissue]
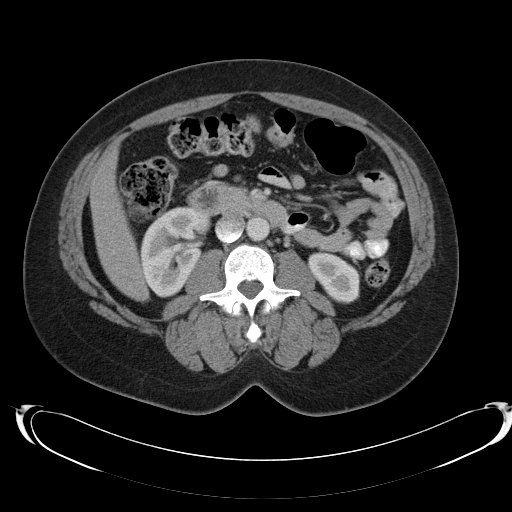
[im 58/116  soft-tissue]
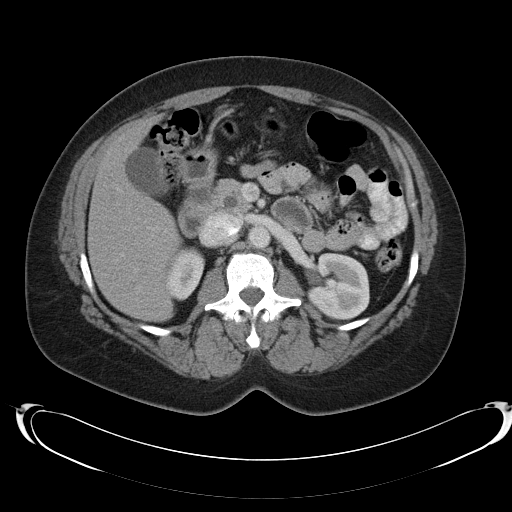
[im 64/116  soft-tissue]
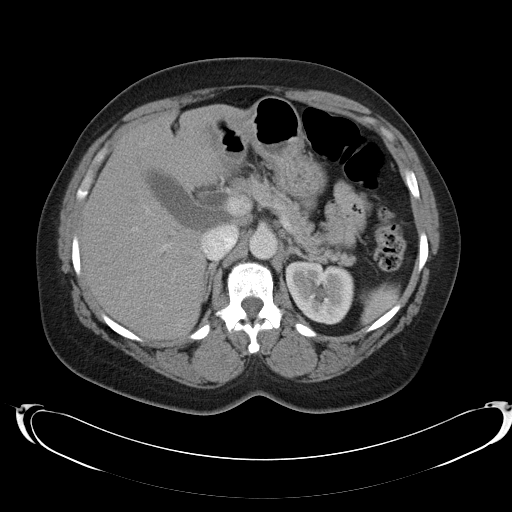
[im 77/116  soft-tissue]
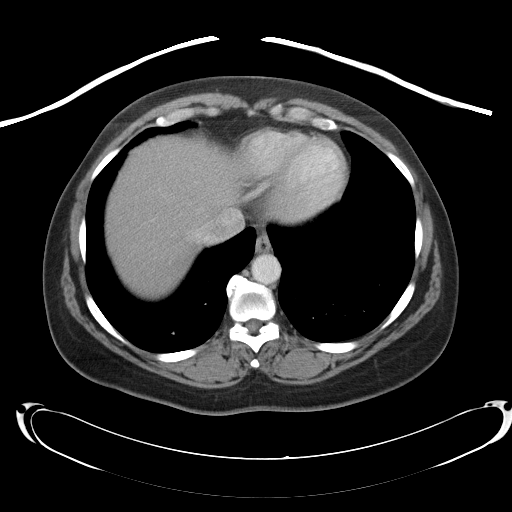
[im 77/116  bone]
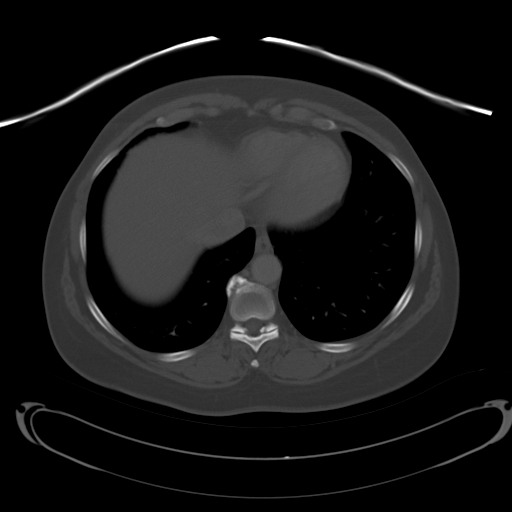
[im 84/116  soft-tissue]
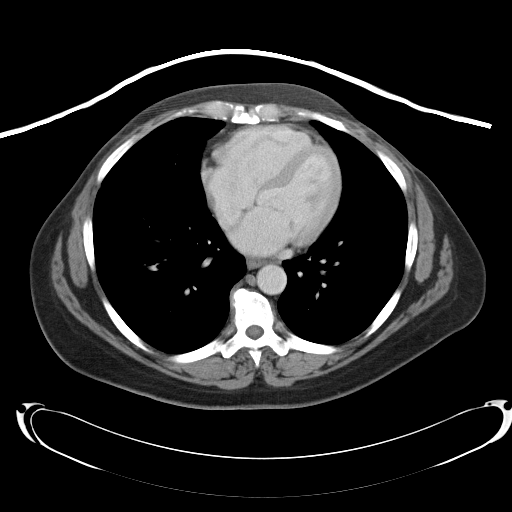
[im 90/116  soft-tissue]
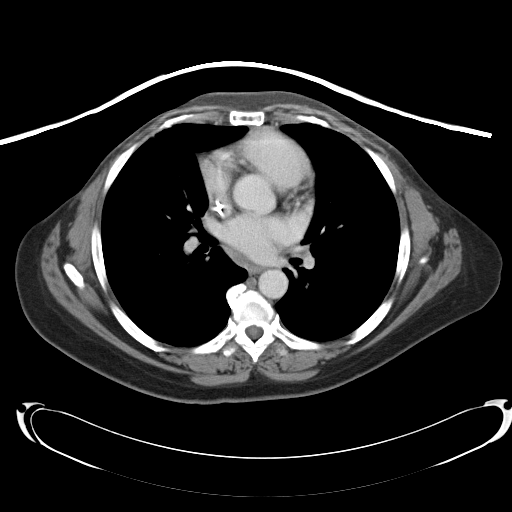
[im 103/116  soft-tissue]
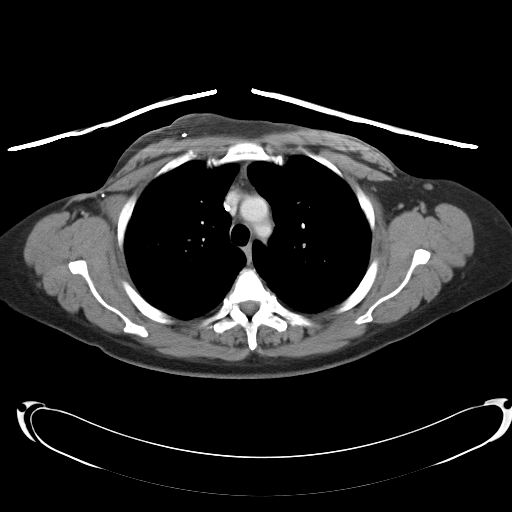
[im 109/116  soft-tissue]
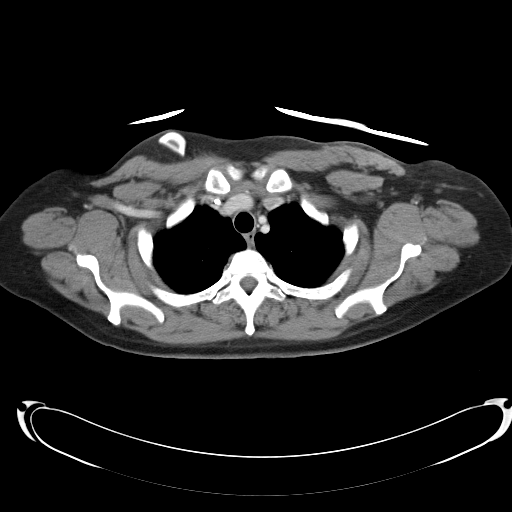

[Series 602: <mpr thick range> · coronal · 1.13mm/px · 3 of 90 slices shown]
[im 30/90  soft-tissue]
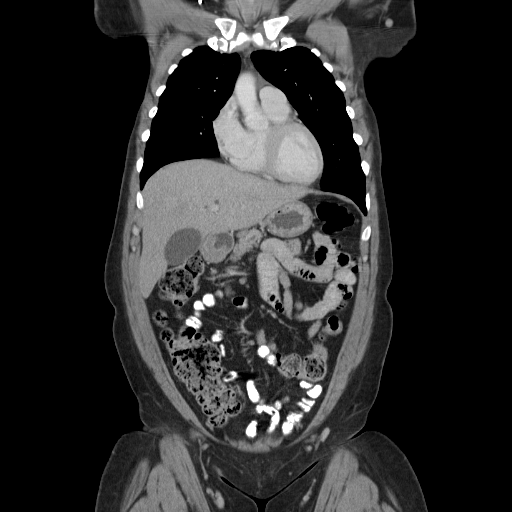
[im 40/90  soft-tissue]
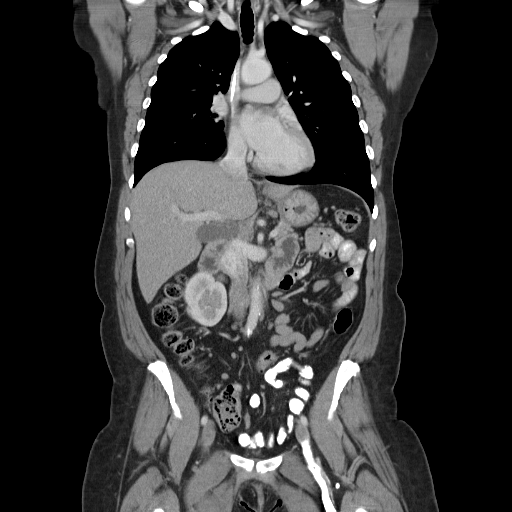
[im 50/90  soft-tissue]
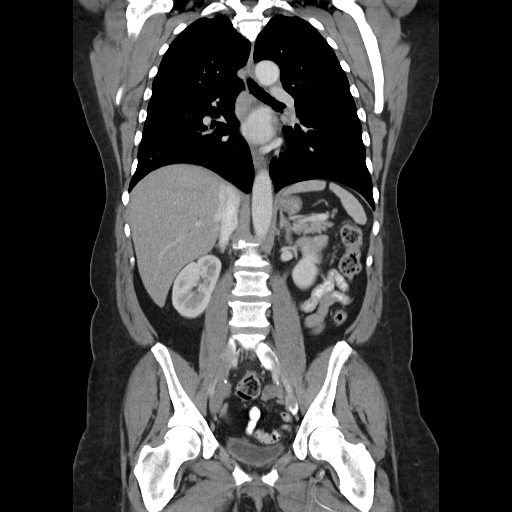

[16 of 46 positions shown; findings below may reference images not displayed]

FINDINGS: Lung windows demonstrate radiation fibrosis in the
anterior right upper lobe.
Stable 5 mm right lower lobe lung nodule on image 29.

Soft tissue windows demonstrate at a right-sided Port-A-Cath which
terminates at the high right atrium.  Bilateral mastectomy.  The
right supraclavicular node described at PET is not imaged.  High
right axillary/lateral sub pectoral lymph node measures 1.1 cm on
image 4 and is new since the prior exam.  Corresponds to
hypermetabolism at PET. Prior right axillary nodal dissection.

Right subpectoral lymph node at 8 mm suspected image 10.  In the
vicinity of the metabolism at PET.

Normal heart size without pericardial or pleural effusion.  There
is age advanced coronary artery atherosclerosis within the LAD on
image 26.  Subcarinal/azygo-esophageal recess lymph node measures 8
mm on image 26 and is unchanged.  Not pathologic by size criteria.
No hilar adenopathy.  No internal mammary adenopathy.
IMPRESSION: 1.  Enlarging right axillary/subpectoral lymph nodes corresponding
to hypermetabolism at PET.  This is consistent with
progressive/recurrent disease.
2.  Age advanced coronary artery atherosclerosis.

CT ABDOMEN AND PELVIS
FINDINGS: Stable too small to characterize lateral segment left
liver lobe lesion, likely a cyst.  Normal spleen, stomach,
pancreas.  The intrahepatic ducts were borderline dilated.  Normal
gallbladder.  The common duct measures up to 1.4 cm.  1.3 cm on the
prior exam.  On the 02/20/2008 exam, the common duct measured
cm maximally. Cannot exclude soft tissue fullness at the ampulla,
including on image 64.

Normal adrenal glands and kidneys No retroperitoneal or retrocrural
adenopathy.

Appendicolith versus contrast within the appendix. Normal colon and
terminal ileum.  Small stable ileocolic mesenteric lymph nodes.
Prominent inguinal lymph nodes are similar and likely reactive.
Normal urinary bladder and uterus without adnexal mass.  Trace free
pelvic fluid. No acute osseous abnormality.
IMPRESSION: 1. No acute process or evidence of metastatic disease in the
abdomen or pelvis.
2.  Slowly progressive biliary ductal dilatation.  Cannot exclude
soft tissue fullness at the ampulla.  Consider correlation with
bilirubin levels.  If these are elevated, further evaluation with
ERCP or MRCP should be considered. Otherwise, this could be
reevaluated at follow-up.

## 2011-01-17 LAB — CBC
HCT: 35.3 % — ABNORMAL LOW (ref 36.0–46.0)
Hemoglobin: 11.6 g/dL — ABNORMAL LOW (ref 12.0–15.0)
MCH: 28.8 pg (ref 26.0–34.0)
MCHC: 32.9 g/dL (ref 30.0–36.0)
MCV: 87.6 fL (ref 78.0–100.0)
Platelets: 221 10*3/uL (ref 150–400)
RBC: 4.03 MIL/uL (ref 3.87–5.11)
RDW: 15.1 % (ref 11.5–15.5)
WBC: 6.5 10*3/uL (ref 4.0–10.5)

## 2011-01-17 LAB — DIFFERENTIAL
Basophils Absolute: 0.1 10*3/uL (ref 0.0–0.1)
Basophils Relative: 1 % (ref 0–1)
Eosinophils Absolute: 0.4 10*3/uL (ref 0.0–0.7)
Eosinophils Relative: 6 % — ABNORMAL HIGH (ref 0–5)
Lymphocytes Relative: 45 % (ref 12–46)
Lymphs Abs: 2.9 10*3/uL (ref 0.7–4.0)
Monocytes Absolute: 0.6 10*3/uL (ref 0.1–1.0)
Monocytes Relative: 9 % (ref 3–12)
Neutro Abs: 2.5 10*3/uL (ref 1.7–7.7)
Neutrophils Relative %: 39 % — ABNORMAL LOW (ref 43–77)

## 2011-01-17 LAB — BASIC METABOLIC PANEL
BUN: 10 mg/dL (ref 6–23)
CO2: 31 mEq/L (ref 19–32)
Calcium: 8.9 mg/dL (ref 8.4–10.5)
Chloride: 102 mEq/L (ref 96–112)
Creatinine, Ser: 1.07 mg/dL (ref 0.4–1.2)
GFR calc Af Amer: >60 mL/min (ref >60)
GFR calc non Af Amer: 53 mL/min — ABNORMAL LOW (ref >60)
Glucose, Bld: 114 mg/dL — ABNORMAL HIGH (ref 70–99)
Potassium: 3.8 mEq/L (ref 3.5–5.1)
Sodium: 140 mEq/L (ref 135–145)

## 2011-01-18 LAB — COMPREHENSIVE METABOLIC PANEL
ALT: 13 U/L (ref 0–35)
AST: 23 U/L (ref 0–37)
Albumin: 3 g/dL — ABNORMAL LOW (ref 3.5–5.2)
Alkaline Phosphatase: 80 U/L (ref 39–117)
BUN: 7 mg/dL (ref 6–23)
CO2: 34 mEq/L — ABNORMAL HIGH (ref 19–32)
Calcium: 8.8 mg/dL (ref 8.4–10.5)
Chloride: 102 mEq/L (ref 96–112)
Creatinine, Ser: 0.83 mg/dL (ref 0.4–1.2)
GFR calc Af Amer: >60 mL/min (ref >60)
GFR calc non Af Amer: >60 mL/min (ref >60)
Glucose, Bld: 87 mg/dL (ref 70–99)
Potassium: 3.5 mEq/L (ref 3.5–5.1)
Sodium: 141 mEq/L (ref 135–145)
Total Bilirubin: 0.4 mg/dL (ref 0.3–1.2)
Total Protein: 6.5 g/dL (ref 6.0–8.3)

## 2011-01-18 LAB — CBC
HCT: 33.1 % — ABNORMAL LOW (ref 36.0–46.0)
HCT: 33.4 % — ABNORMAL LOW (ref 36.0–46.0)
HCT: 34.5 % — ABNORMAL LOW (ref 36.0–46.0)
HCT: 36.9 % (ref 36.0–46.0)
Hemoglobin: 10.6 g/dL — ABNORMAL LOW (ref 12.0–15.0)
Hemoglobin: 10.7 g/dL — ABNORMAL LOW (ref 12.0–15.0)
Hemoglobin: 10.7 g/dL — ABNORMAL LOW (ref 12.0–15.0)
Hemoglobin: 12 g/dL (ref 12.0–15.0)
MCH: 27.3 pg (ref 26.0–34.0)
MCH: 27.7 pg (ref 26.0–34.0)
MCH: 28.2 pg (ref 26.0–34.0)
MCH: 27.6 pg (ref 26.0–34.0)
MCHC: 31 g/dL (ref 30.0–36.0)
MCHC: 31.7 g/dL (ref 30.0–36.0)
MCHC: 32.3 g/dL (ref 30.0–36.0)
MCHC: 32.5 g/dL (ref 30.0–36.0)
MCV: 87.3 fL (ref 78.0–100.0)
MCV: 87.4 fL (ref 78.0–100.0)
MCV: 88 fL (ref 78.0–100.0)
MCV: 85 fL (ref 78.0–100.0)
Platelets: 196 10*3/uL (ref 150–400)
Platelets: 203 10*3/uL (ref 150–400)
Platelets: 220 10*3/uL (ref 150–400)
Platelets: 216 10*3/uL (ref 150–400)
RBC: 3.79 MIL/uL — ABNORMAL LOW (ref 3.87–5.11)
RBC: 3.82 MIL/uL — ABNORMAL LOW (ref 3.87–5.11)
RBC: 3.92 MIL/uL (ref 3.87–5.11)
RBC: 4.34 MIL/uL (ref 3.87–5.11)
RDW: 14.4 % (ref 11.5–15.5)
RDW: 14.5 % (ref 11.5–15.5)
RDW: 14.5 % (ref 11.5–15.5)
RDW: 14 % (ref 11.5–15.5)
WBC: 6.3 10*3/uL (ref 4.0–10.5)
WBC: 6.3 10*3/uL (ref 4.0–10.5)
WBC: 9.9 10*3/uL (ref 4.0–10.5)
WBC: 6.1 10*3/uL (ref 4.0–10.5)

## 2011-01-18 LAB — POCT I-STAT, CHEM 8
BUN: 11 mg/dL (ref 6–23)
Calcium, Ion: 1.06 mmol/L — ABNORMAL LOW (ref 1.12–1.32)
Chloride: 102 mEq/L (ref 96–112)
Creatinine, Ser: 1.4 mg/dL — ABNORMAL HIGH (ref 0.4–1.2)
Glucose, Bld: 113 mg/dL — ABNORMAL HIGH (ref 70–99)
HCT: 34 % — ABNORMAL LOW (ref 36.0–46.0)
Hemoglobin: 11.6 g/dL — ABNORMAL LOW (ref 12.0–15.0)
Potassium: 3.4 mEq/L — ABNORMAL LOW (ref 3.5–5.1)
Sodium: 141 mEq/L (ref 135–145)
TCO2: 33 mmol/L (ref 0–100)

## 2011-01-18 LAB — BASIC METABOLIC PANEL
BUN: 8 mg/dL (ref 6–23)
BUN: 9 mg/dL (ref 6–23)
BUN: 4 mg/dL — ABNORMAL LOW (ref 6–23)
CO2: 31 mEq/L (ref 19–32)
CO2: 32 mEq/L (ref 19–32)
CO2: 28 mEq/L (ref 19–32)
Calcium: 8.5 mg/dL (ref 8.4–10.5)
Calcium: 8.7 mg/dL (ref 8.4–10.5)
Calcium: 9 mg/dL (ref 8.4–10.5)
Chloride: 102 mEq/L (ref 96–112)
Chloride: 105 mEq/L (ref 96–112)
Chloride: 105 mEq/L (ref 96–112)
Creatinine, Ser: 0.88 mg/dL (ref 0.4–1.2)
Creatinine, Ser: 1.26 mg/dL — ABNORMAL HIGH (ref 0.4–1.2)
Creatinine, Ser: 0.73 mg/dL (ref 0.4–1.2)
GFR calc Af Amer: 53 mL/min — ABNORMAL LOW (ref >60)
GFR calc Af Amer: >60 mL/min (ref >60)
GFR calc Af Amer: >60 mL/min (ref >60)
GFR calc non Af Amer: 44 mL/min — ABNORMAL LOW (ref >60)
GFR calc non Af Amer: >60 mL/min (ref >60)
GFR calc non Af Amer: >60 mL/min (ref >60)
Glucose, Bld: 101 mg/dL — ABNORMAL HIGH (ref 70–99)
Glucose, Bld: 116 mg/dL — ABNORMAL HIGH (ref 70–99)
Glucose, Bld: 98 mg/dL (ref 70–99)
Potassium: 3.4 mEq/L — ABNORMAL LOW (ref 3.5–5.1)
Potassium: 3.6 mEq/L (ref 3.5–5.1)
Potassium: 3.5 mEq/L (ref 3.5–5.1)
Sodium: 141 mEq/L (ref 135–145)
Sodium: 141 mEq/L (ref 135–145)
Sodium: 142 mEq/L (ref 135–145)

## 2011-01-18 LAB — DIFFERENTIAL
Basophils Absolute: 0 10*3/uL (ref 0.0–0.1)
Basophils Absolute: 0 10*3/uL (ref 0.0–0.1)
Basophils Relative: 0 % (ref 0–1)
Basophils Relative: 1 % (ref 0–1)
Eosinophils Absolute: 0.2 10*3/uL (ref 0.0–0.7)
Eosinophils Absolute: 0.2 10*3/uL (ref 0.0–0.7)
Eosinophils Relative: 3 % (ref 0–5)
Eosinophils Relative: 4 % (ref 0–5)
Lymphocytes Relative: 29 % (ref 12–46)
Lymphocytes Relative: 34 % (ref 12–46)
Lymphs Abs: 1.8 10*3/uL (ref 0.7–4.0)
Lymphs Abs: 2.1 10*3/uL (ref 0.7–4.0)
Monocytes Absolute: 0.7 10*3/uL (ref 0.1–1.0)
Monocytes Absolute: 0.8 10*3/uL (ref 0.1–1.0)
Monocytes Relative: 11 % (ref 3–12)
Monocytes Relative: 12 % (ref 3–12)
Neutro Abs: 3.2 10*3/uL (ref 1.7–7.7)
Neutro Abs: 3.4 10*3/uL (ref 1.7–7.7)
Neutrophils Relative %: 51 % (ref 43–77)
Neutrophils Relative %: 55 % (ref 43–77)

## 2011-01-18 LAB — CARDIAC PANEL(CRET KIN+CKTOT+MB+TROPI)
CK, MB: 0.6 ng/mL (ref 0.3–4.0)
Relative Index: 0.4 (ref 0.0–2.5)
Total CK: 143 U/L (ref 7–177)
Troponin I: 0.01 ng/mL (ref 0.00–0.06)

## 2011-01-18 LAB — POCT CARDIAC MARKERS
CKMB, poc: <1 ng/mL — ABNORMAL LOW (ref 1.0–8.0)
Myoglobin, poc: 51.9 ng/mL (ref 12–200)
Troponin i, poc: <0.05 ng/mL (ref 0.00–0.09)

## 2011-01-18 LAB — BRAIN NATRIURETIC PEPTIDE: Pro B Natriuretic peptide (BNP): 127 pg/mL — ABNORMAL HIGH (ref 0.0–100.0)

## 2011-01-18 LAB — MRSA PCR SCREENING: MRSA by PCR: POSITIVE — AB

## 2011-01-19 LAB — GLUCOSE, CAPILLARY
Glucose-Capillary: 106 mg/dL — ABNORMAL HIGH (ref 70–99)
Glucose-Capillary: 115 mg/dL — ABNORMAL HIGH (ref 70–99)

## 2011-01-25 LAB — POCT I-STAT, CHEM 8
BUN: 9 mg/dL (ref 6–23)
Calcium, Ion: 1.16 mmol/L (ref 1.12–1.32)
Chloride: 104 mEq/L (ref 96–112)
Creatinine, Ser: 0.8 mg/dL (ref 0.4–1.2)
Glucose, Bld: 99 mg/dL (ref 70–99)
HCT: 35 % — ABNORMAL LOW (ref 36.0–46.0)
Hemoglobin: 11.9 g/dL — ABNORMAL LOW (ref 12.0–15.0)
Potassium: 3.6 mEq/L (ref 3.5–5.1)
Sodium: 142 mEq/L (ref 135–145)
TCO2: 29 mmol/L (ref 0–100)

## 2011-01-25 LAB — DIFFERENTIAL
Basophils Absolute: 0 10*3/uL (ref 0.0–0.1)
Basophils Relative: 0 % (ref 0–1)
Eosinophils Absolute: 0.1 10*3/uL (ref 0.0–0.7)
Eosinophils Relative: 2 % (ref 0–5)
Lymphocytes Relative: 33 % (ref 12–46)
Lymphs Abs: 1.9 10*3/uL (ref 0.7–4.0)
Monocytes Absolute: 0.6 10*3/uL (ref 0.1–1.0)
Monocytes Relative: 11 % (ref 3–12)
Neutro Abs: 3.1 10*3/uL (ref 1.7–7.7)
Neutrophils Relative %: 54 % (ref 43–77)

## 2011-01-25 LAB — CBC
HCT: 33.9 % — ABNORMAL LOW (ref 36.0–46.0)
Hemoglobin: 11 g/dL — ABNORMAL LOW (ref 12.0–15.0)
MCHC: 32.5 g/dL (ref 30.0–36.0)
MCV: 90.2 fL (ref 78.0–100.0)
Platelets: 277 10*3/uL (ref 150–400)
RBC: 3.75 MIL/uL — ABNORMAL LOW (ref 3.87–5.11)
RDW: 17.6 % — ABNORMAL HIGH (ref 11.5–15.5)
WBC: 5.7 10*3/uL (ref 4.0–10.5)

## 2011-01-25 LAB — COMPREHENSIVE METABOLIC PANEL
ALT: 14 U/L (ref 0–35)
AST: 19 U/L (ref 0–37)
Albumin: 3.4 g/dL — ABNORMAL LOW (ref 3.5–5.2)
Alkaline Phosphatase: 77 U/L (ref 39–117)
BUN: 9 mg/dL (ref 6–23)
CO2: 29 mEq/L (ref 19–32)
Calcium: 8.7 mg/dL (ref 8.4–10.5)
Chloride: 103 mEq/L (ref 96–112)
Creatinine, Ser: 0.74 mg/dL (ref 0.4–1.2)
GFR calc Af Amer: >60 mL/min (ref >60)
GFR calc non Af Amer: >60 mL/min (ref >60)
Glucose, Bld: 98 mg/dL (ref 70–99)
Potassium: 3.6 mEq/L (ref 3.5–5.1)
Sodium: 138 mEq/L (ref 135–145)
Total Bilirubin: 0.2 mg/dL — ABNORMAL LOW (ref 0.3–1.2)
Total Protein: 6.2 g/dL (ref 6.0–8.3)

## 2011-01-25 LAB — POCT CARDIAC MARKERS
CKMB, poc: <1 ng/mL — ABNORMAL LOW (ref 1.0–8.0)
Myoglobin, poc: 56.6 ng/mL (ref 12–200)
Troponin i, poc: <0.05 ng/mL (ref 0.00–0.09)

## 2011-01-25 LAB — LIPASE, BLOOD: Lipase: 22 U/L (ref 11–59)

## 2011-01-25 LAB — GLUCOSE, CAPILLARY: Glucose-Capillary: 121 mg/dL — ABNORMAL HIGH (ref 70–99)

## 2011-01-27 IMAGING — CR DG CHEST 2V
2 series · 2 of 2 positions shown · non-contrast
Comparison: Previous examinations.

CLINICAL DATA: Chest pain for the past 4 days.  Cough.  Smoker.

CHEST - 2 VIEW

[w chest pa]
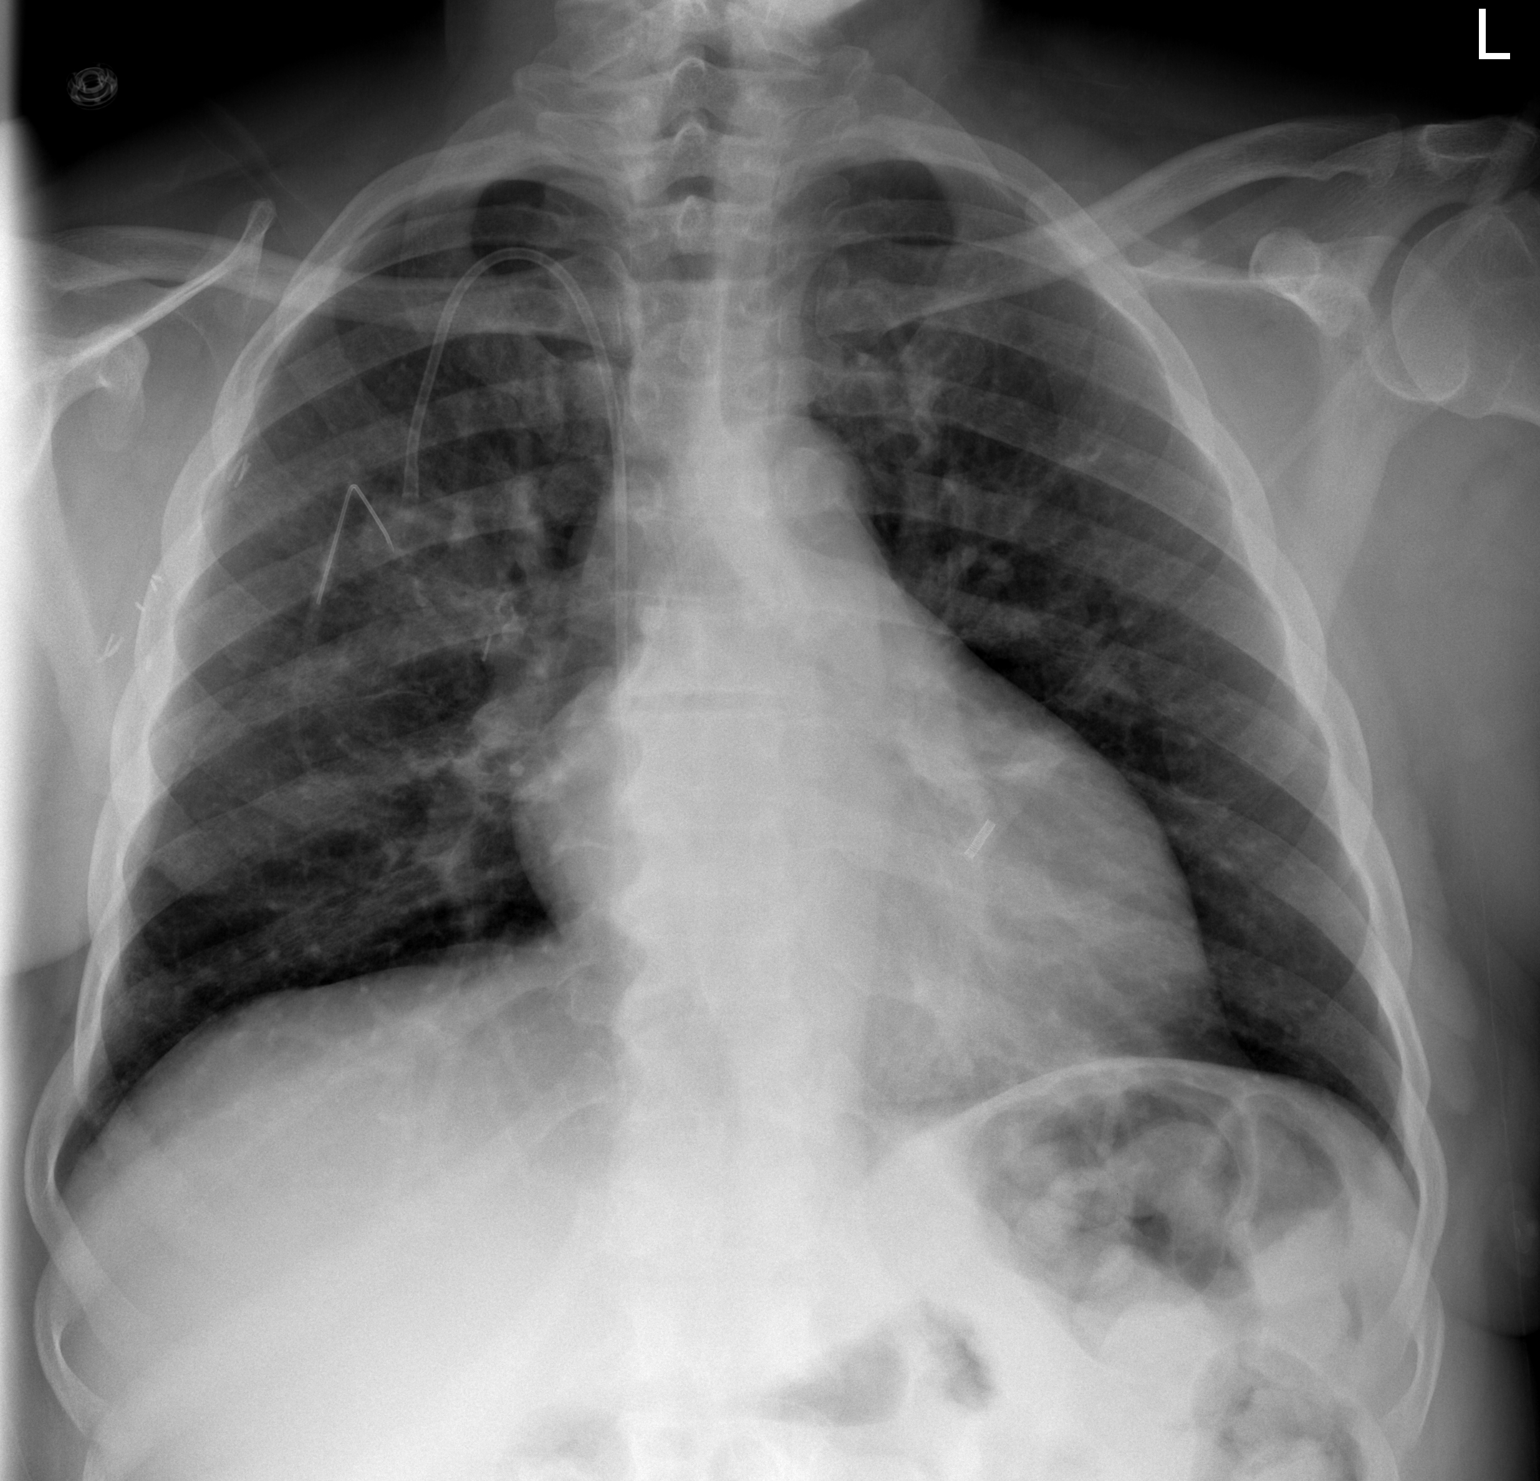

[w chest lat]
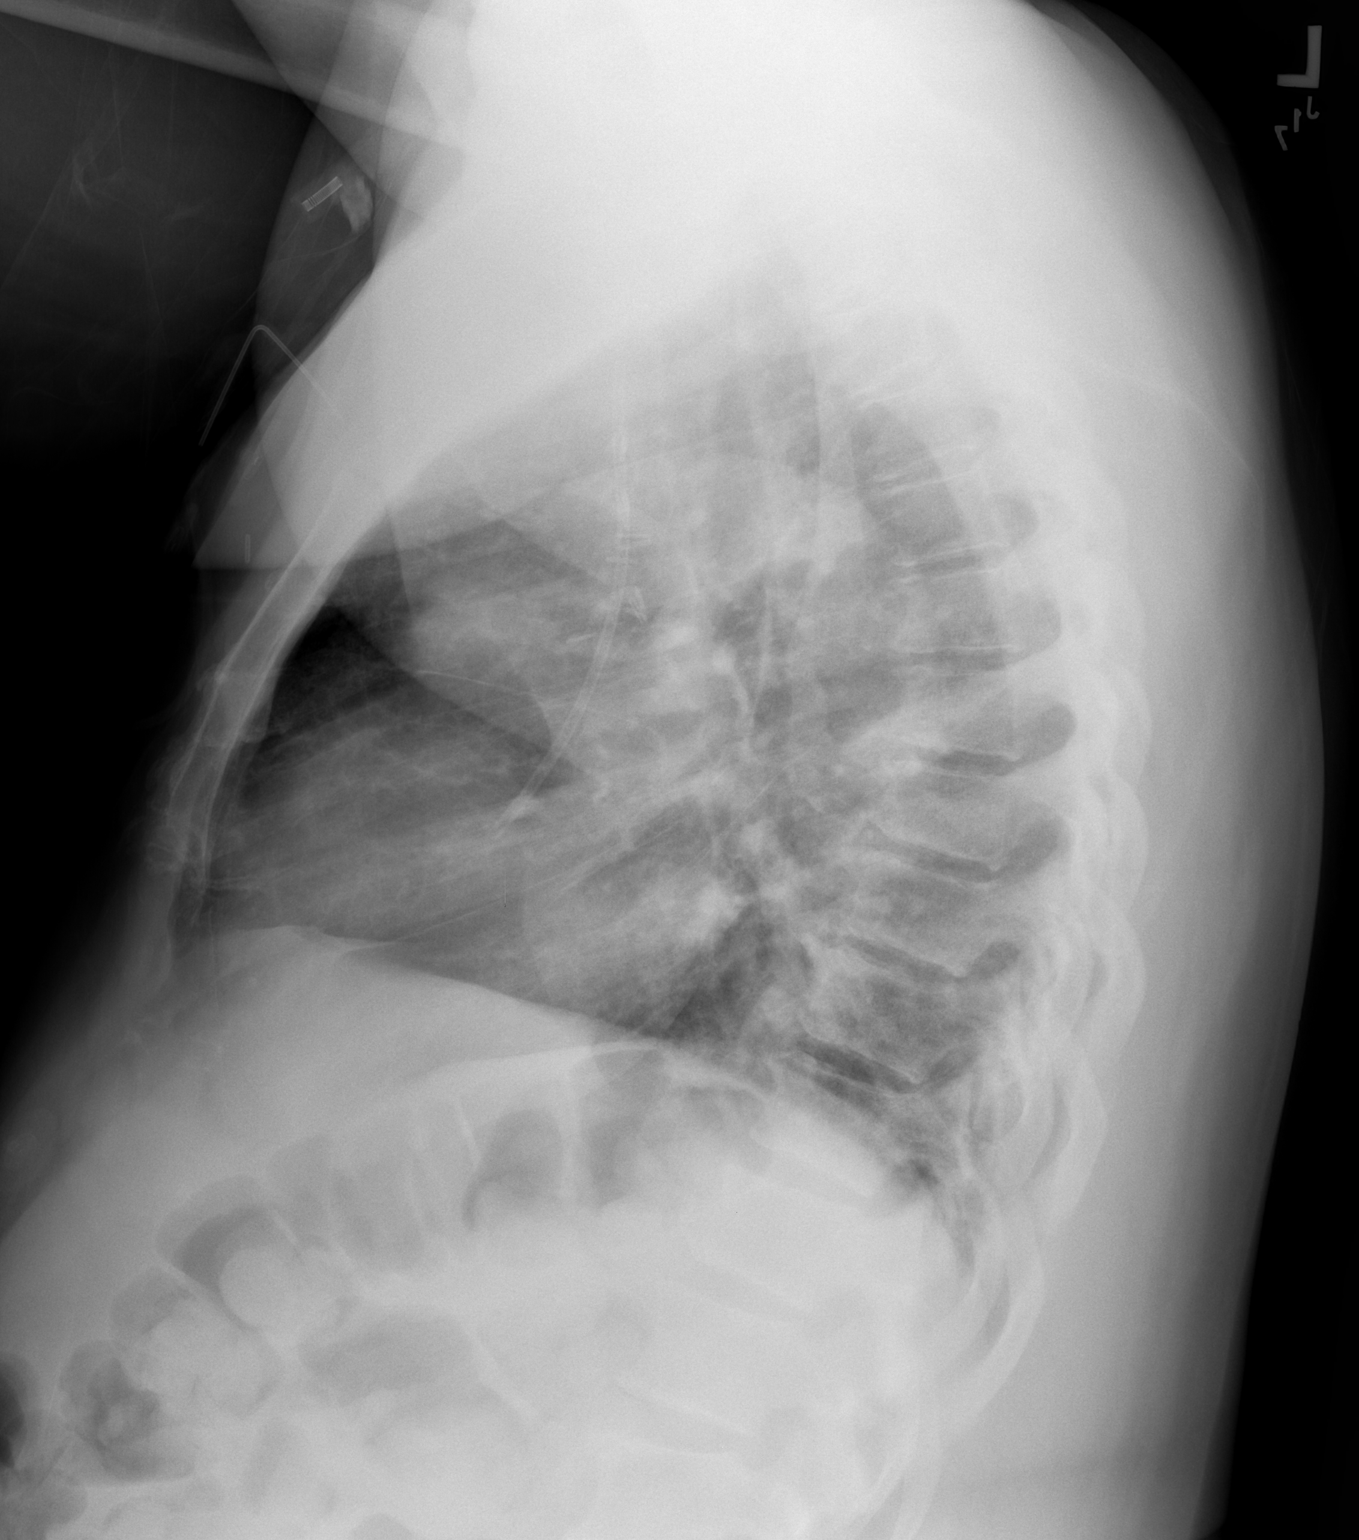

[2 of 2 positions shown; findings below may reference images not displayed]

FINDINGS: Interval enlargement of the cardiac silhouette compared
to 03/06/2010.  Stable right jugular porta-catheter.  Small amount
of ill-defined opacity in the right perihilar region.  Right
axillary surgical clips.  Thoracic spine degenerative changes.
IMPRESSION: 1.  Interval mild, ill-defined right perihilar opacity, possibly
representing pneumonia.
2.  Interval mild cardiomegaly.

## 2011-01-28 IMAGING — CT CT ANGIO CHEST
2 of 6 series · 19 of 36 positions shown · IV contrast (APPLIED)
Comparison: 08/17/2010

CLINICAL DATA: Short of breath.  Pneumonia.

CT ANGIOGRAPHY CHEST WITH CONTRAST
TECHNIQUE: Multidetector CT imaging of the chest was performed
using the standard protocol during bolus administration of
intravenous contrast.  Multiplanar CT image reconstructions
including MIPs were obtained to evaluate the vascular anatomy.
Contrast:  100 ml 0mnipaque-KGG

[Series 10: pulm embolism 1.0 b25f thins · axial · 0.67mm/px · z∈[-160,+72]mm · 18 of 258 slices shown]
[im 13/258  lung]
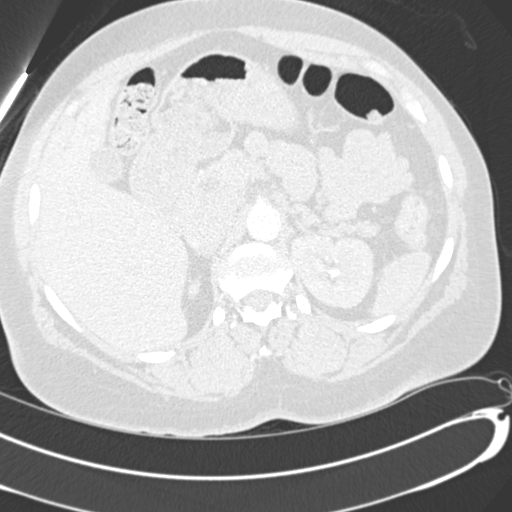
[im 26/258  mediastinal]
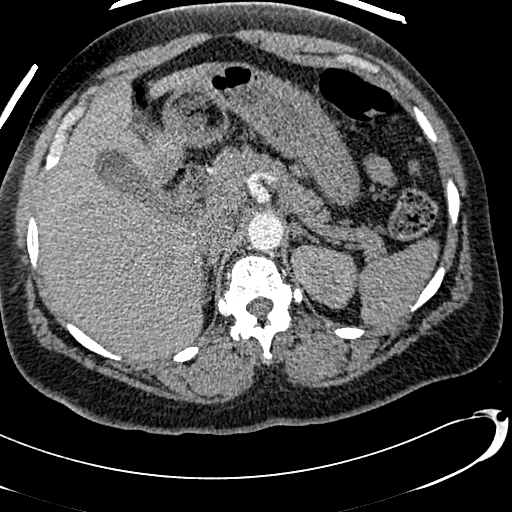
[im 39/258  lung]
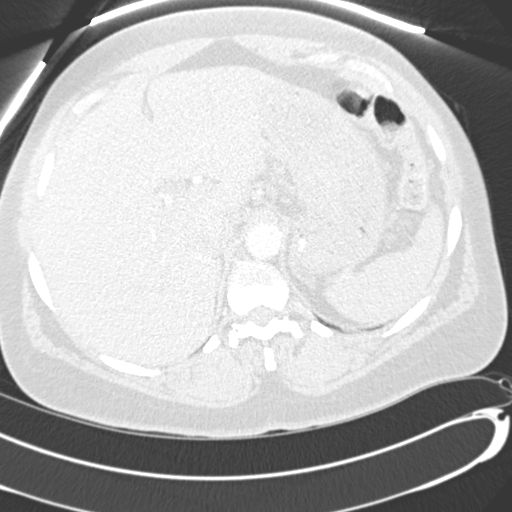
[im 52/258  mediastinal]
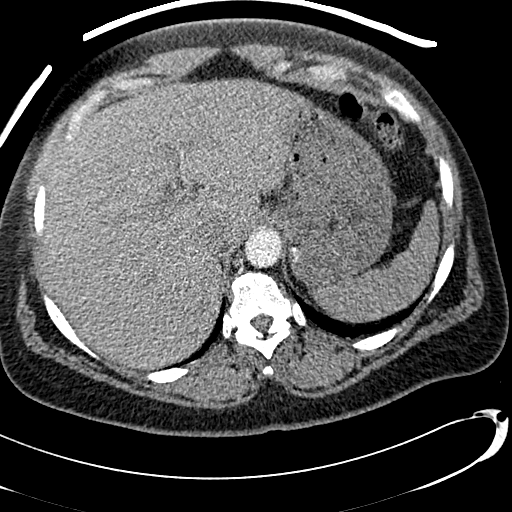
[im 65/258  lung]
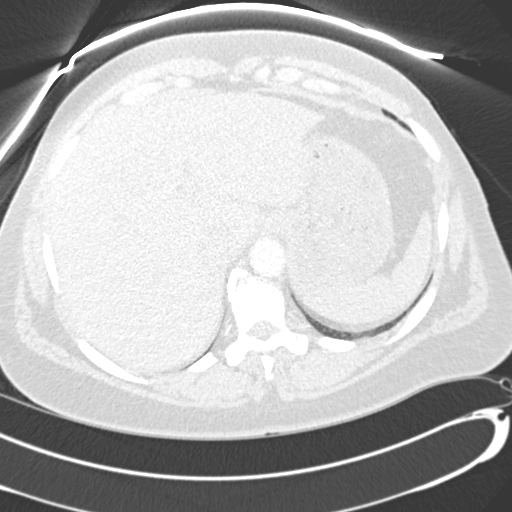
[im 78/258  mediastinal]
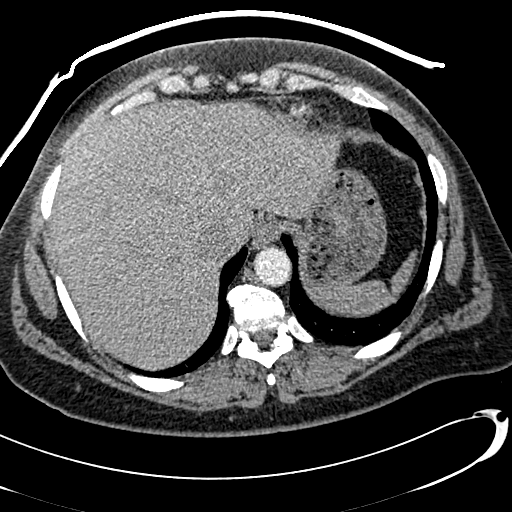
[im 90/258  lung]
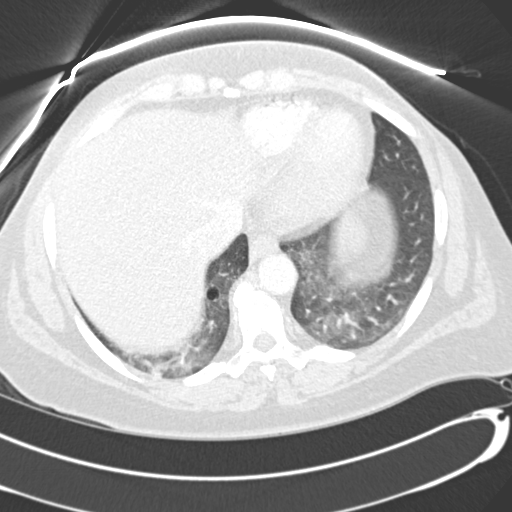
[im 103/258  mediastinal]
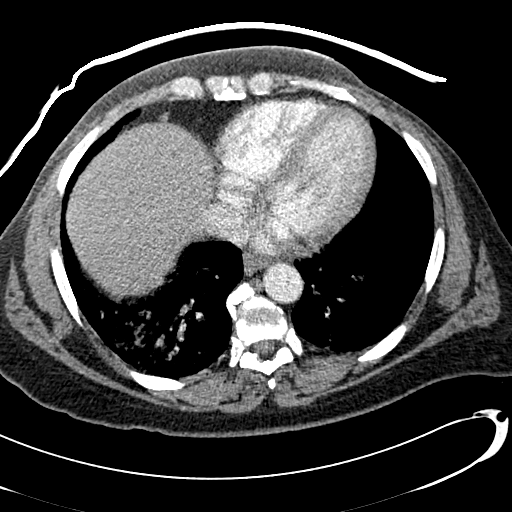
[im 116/258  lung]
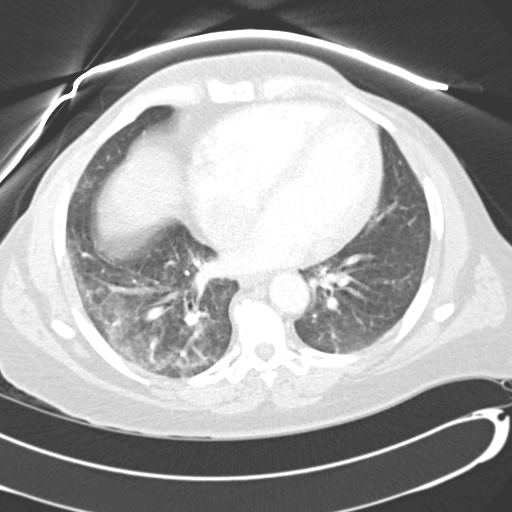
[im 142/258  mediastinal]
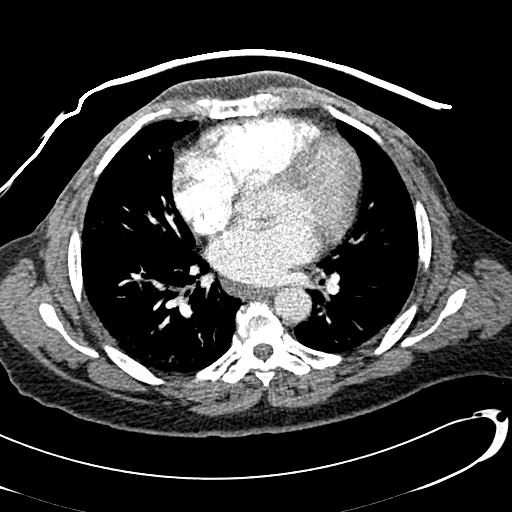
[im 155/258  lung]
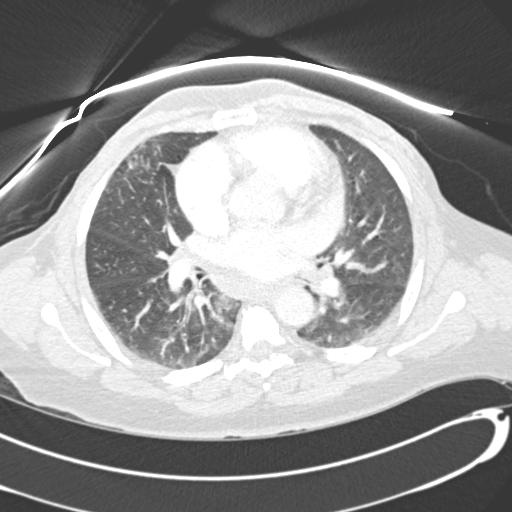
[im 168/258  mediastinal]
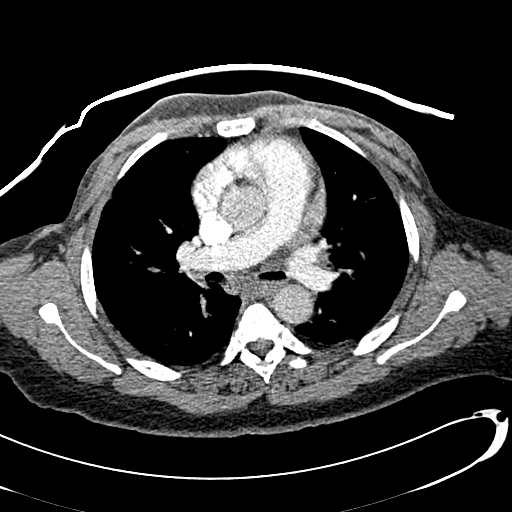
[im 180/258  lung]
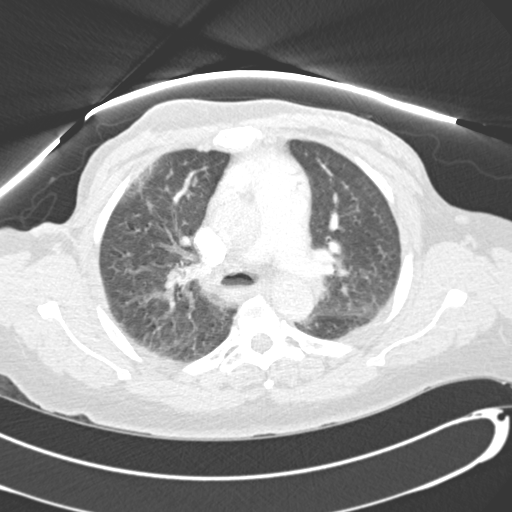
[im 193/258  mediastinal]
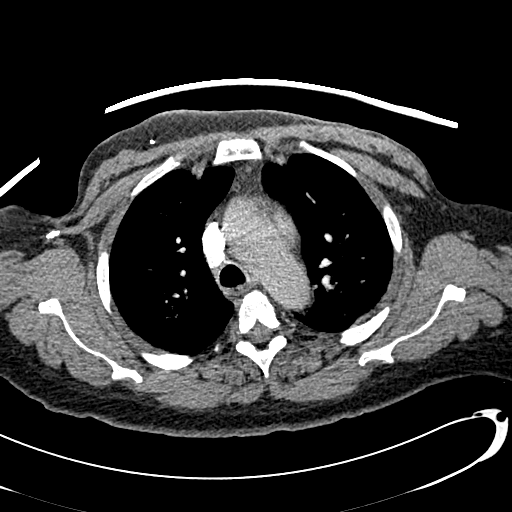
[im 206/258  lung]
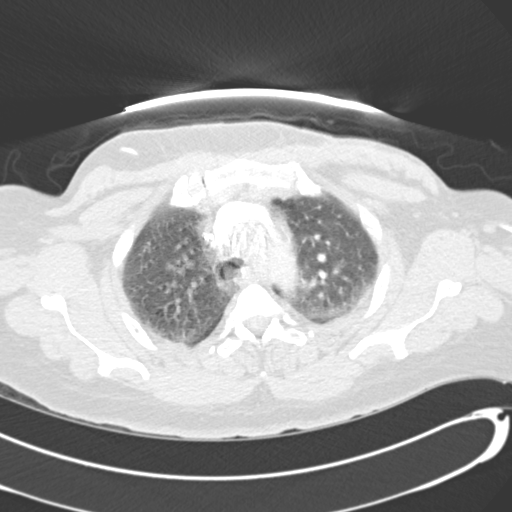
[im 219/258  mediastinal]
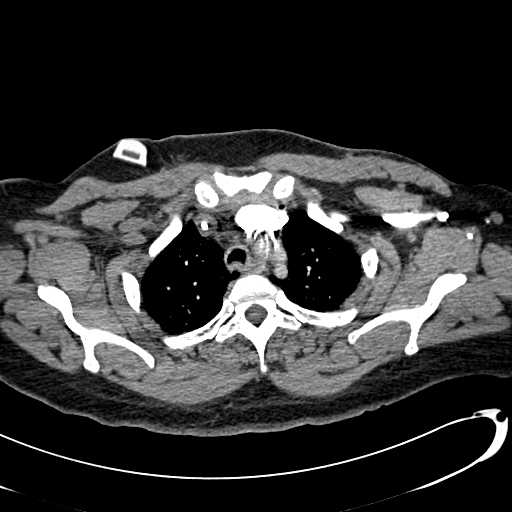
[im 232/258  lung]
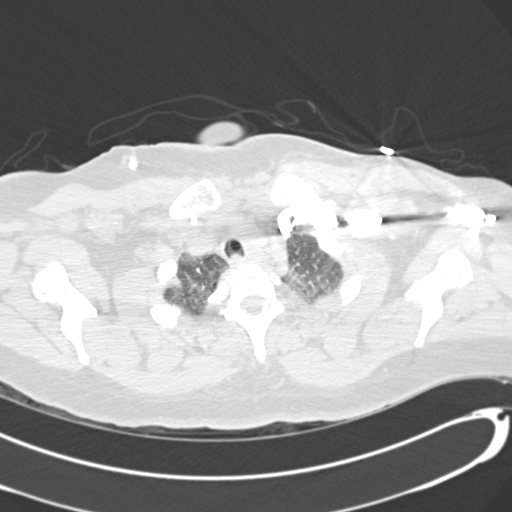
[im 245/258  mediastinal]
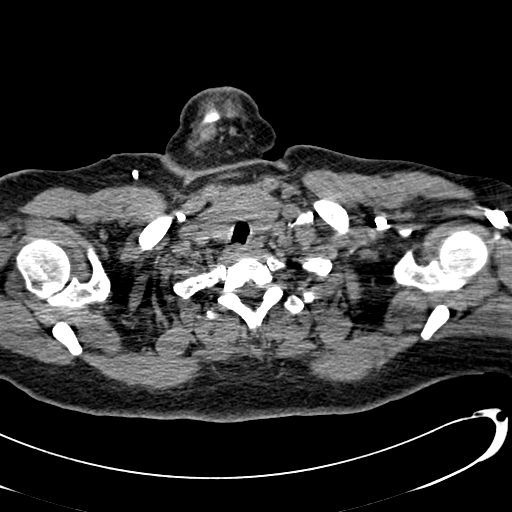

[Series 11: pulm embolism 2.0 spo thins · coronal · 0.67mm/px · 1 of 132 slices shown]
[im 66/132  mediastinal]
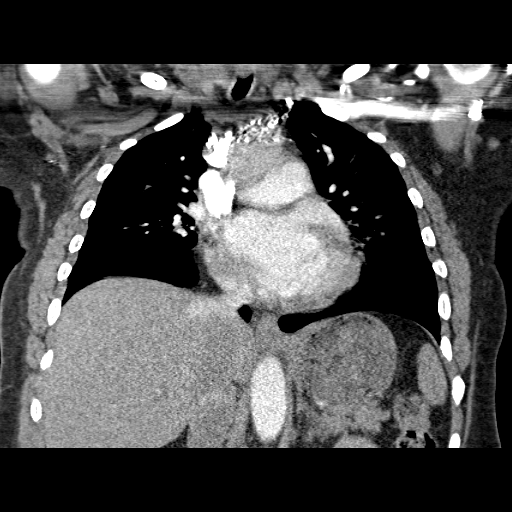

[19 of 36 positions shown; findings below may reference images not displayed]

FINDINGS: Motion artifact degrades the study.  No obvious filling
defects in the pulmonary arterial tree to suggest acute pulmonary
thromboembolism.

Subcarinal and right pectoral adenopathy is stable.  Right axillary
adenopathy is stable.  Postoperative changes are stable.  Right
internal jugular Port-A-Cath is unchanged.

No pneumothorax.  No pleural effusion.

Airspace disease at the base of the right middle and lower lobes
has developed.  Left lung is clear other than minimal basilar
atelectasis.

No pneumothorax or pleural effusion

Stable dilatation of the common bile duct.  Hepatomegaly.

Review of the MIP images confirms the above findings.
IMPRESSION: No evidence of acute pulmonary thromboembolism.

Patchy airspace disease in the right middle and lower lobes
worrisome for inflammatory process.

Stable findings associated with metastatic disease.

## 2011-02-10 LAB — GLUCOSE, CAPILLARY: Glucose-Capillary: 98 mg/dL (ref 70–99)

## 2011-02-15 LAB — GLUCOSE, CAPILLARY: Glucose-Capillary: 95 mg/dL (ref 70–99)

## 2011-02-22 ENCOUNTER — Encounter (HOSPITAL_BASED_OUTPATIENT_CLINIC_OR_DEPARTMENT_OTHER): Payer: Medicare Other | Admitting: Oncology

## 2011-02-22 DIAGNOSIS — C50219 Malignant neoplasm of upper-inner quadrant of unspecified female breast: Secondary | ICD-10-CM

## 2011-02-22 DIAGNOSIS — C801 Malignant (primary) neoplasm, unspecified: Secondary | ICD-10-CM

## 2011-02-22 LAB — CBC WITH DIFFERENTIAL/PLATELET
BASO%: 0.4 % (ref 0.0–2.0)
Basophils Absolute: 0 10*3/uL (ref 0.0–0.1)
EOS%: 4 % (ref 0.0–7.0)
Eosinophils Absolute: 0.3 10*3/uL (ref 0.0–0.5)
HCT: 38.1 % (ref 34.8–46.6)
HGB: 12.6 g/dL (ref 11.6–15.9)
LYMPH%: 31.4 % (ref 14.0–49.7)
MCH: 28.2 pg (ref 25.1–34.0)
MCHC: 33.1 g/dL (ref 31.5–36.0)
MCV: 85.2 fL (ref 79.5–101.0)
MONO#: 0.6 10*3/uL (ref 0.1–0.9)
MONO%: 9.4 % (ref 0.0–14.0)
NEUT#: 3.7 10*3/uL (ref 1.5–6.5)
NEUT%: 54.8 % (ref 38.4–76.8)
Platelets: 240 10*3/uL (ref 145–400)
RBC: 4.47 10*6/uL (ref 3.70–5.45)
RDW: 14.5 % (ref 11.2–14.5)
WBC: 6.7 10*3/uL (ref 3.9–10.3)
lymph#: 2.1 10*3/uL (ref 0.9–3.3)
nRBC: 0 % (ref 0–0)

## 2011-02-22 LAB — GLUCOSE, CAPILLARY: Glucose-Capillary: 101 mg/dL — ABNORMAL HIGH (ref 70–99)

## 2011-03-18 ENCOUNTER — Inpatient Hospital Stay (INDEPENDENT_AMBULATORY_CARE_PROVIDER_SITE_OTHER)
Admission: RE | Admit: 2011-03-18 | Discharge: 2011-03-18 | Disposition: A | Discharge status: Home / Self Care | Payer: Medicare Other | Source: Ambulatory Visit | Attending: Emergency Medicine | Admitting: Emergency Medicine

## 2011-03-18 DIAGNOSIS — L259 Unspecified contact dermatitis, unspecified cause: Secondary | ICD-10-CM

## 2011-03-18 DIAGNOSIS — J40 Bronchitis, not specified as acute or chronic: Secondary | ICD-10-CM

## 2011-03-22 NOTE — Op Note (Signed)
NAMEJENNILEE, [name]                ACCOUNT NO.:  [id number]   MEDICAL RECORD NO.:  [id number]          PATIENT TYPE:  OIB   LOCATION:  5740                         FACILITY:  [facility]   PHYSICIAN:  [name], [name]   DATE OF BIRTH:  03-Jul-1956   DATE OF PROCEDURE:  02/13/2006  DATE OF DISCHARGE:  02/12/2006                                 OPERATIVE REPORT   PREOPERATIVE DIAGNOSIS:  Ductal carcinoma in situ left breast.   POSTOPERATIVE DIAGNOSIS:  Ductal carcinoma in situ left breast.   OPERATION:  1.  Blue dye injection.  2.  Left axillary sentinel lymph node biopsy.  3.  Left total mastectomy.   SURGEON:  [name], [name]   ANESTHESIA:  General.   OPERATIVE PROCEDURE:  Prior to going to the operating room, 1 mCi of  technetium sulfur colloid was injected intradermally.   After suitable general anesthesia was induced, the patient was placed in the  supine position and 5 mL of a mixture of 2 mL of methylene blue and 3 mL of  injectable saline was injected in the subareolar tissue and the breast  gently massaged for three minutes.  We then prepped and draped the breast  and axilla.   I carefully scanned, with the NeoProbe, left axilla.  There was a clear-cut  hot spot and a short transverse axillary incision was made with dissection  through subcutaneous tissue to the clavipectoral fascia.  Deep to the fascia  were three blue and hot nodes which were removed.  There were no other blue,  palpable or hot nodes.  These were submitted to the pathologist for  evaluation for metastatic disease.   While that was being done the mastectomy was carried out.  I outlined the  transverse elliptical incision which would incorporate the nipple-areolar  complex and the appropriate amount of skin.  The incision was then made and  the superior, inferior, medial and lateral flaps were then dissected in the  standard fashion using the cautery with the boundaries going to near the  clavicle superiorly and to the medial border of the sternum medially and to  the inframammary fold at the level of the rectus fascia inferiorly and to  latissimus dorsi muscle laterally.   We then removed the breast by dissecting from medial to lateral  incorporating the pectoralis fascia.  At the time we approached the lateral  margin of the pectoralis major muscle, the pathologist reported that the  sentinel nodes were negative for metastatic disease.  We then terminated the  mastectomy lateral to the pectoralis muscle incorporating the tail of the  breast.   Hemostasis obtained with cautery.  We thoroughly irrigated field with  saline.   One 19-French Harrison Mons drain was inserted, brought out through a separate stab  wound.   The two incisions, the one in the axilla and the mastectomy incision were  then closed with staples.  Dressings were applied.  The patient transferred  to the recovery room in satisfactory condition having tolerated procedure  well.      [name], [name]  Electronically Signed     PRY/MEDQ  D:  02/13/2006  T:  02/14/2006  Job:  [id number]

## 2011-03-25 NOTE — Op Note (Signed)
[name], [name]                            ACCOUNT NO.:  [id number]   MEDICAL RECORD NO.:  [id number]                   PATIENT TYPE:  AMB   LOCATION:  DSC                                  FACILITY:  [facility]   PHYSICIAN:  [name], [name]                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   OPERATION PERFORMED:  Insertion of Port-A-Cath.   SURGEON:  [name], [name]   ANESTHESIA:  General.   DESCRIPTION OF PROCEDURE:  After suitable general anesthesia was induced,  the patient was placed in the supine position on the table and the left  upper chest prepped and draped in the usual fashion.  Left subclavian  puncture was carried out without difficulty and the guidewire inserted and  proper positioning of the wire confirmed by fluoroscopy.  We then made an  anterior incision on the chest wall and developed a pocket for the  implantable port.  I tunneled between the puncture site and the newly  developed pocket and passed the catheter and we then attached it to the port  and then anchored the port in the pocket with two 2-0 Prolene sutures.  We  then passed the dilator and peel-away sheath over the wire and removed the  wire and the dilator and passed the catheter through the peel-away sheath  and then removed it.  I had previously trimmed the catheter to fit at about  the fourth interspace which is at the cavo-atrial junction.  On fluoroscopy  there was no kinking of the system and the system was in the superior vena  cava.  The incisions were closed with 3-0 Vicryl and subcuticular 4-0  Monocryl and Steri-Strips.  The system was fully flushed, aspirated and then  fully heparinized.  Dressing applied.  The patient was  then transferred to  the recovery room in satisfactory condition having tolerated the procedure  well.                                               [name], [name]    PRY/MEDQ  D:  03/08/2004  T:  03/08/2004  Job:  [id number]   cc:   Genene Churn. Cyndie Chime, [name]  [address] [facility]  [place]  [place] [address]  Fax: [phone]

## 2011-03-25 NOTE — Op Note (Signed)
NAMEMARVELL, [name]                            ACCOUNT NO.:  [id number]   MEDICAL RECORD NO.:  [id number]                   PATIENT TYPE:  INP   LOCATION:  NA                                   FACILITY:  [facility]   PHYSICIAN:  [name], [name]                DATE OF BIRTH:  May 02, 1956   DATE OF PROCEDURE:  07/13/2004  DATE OF DISCHARGE:                                 OPERATIVE REPORT   PREOPERATIVE DIAGNOSIS:  Recurrent right breast cancer.   POSTOPERATIVE DIAGNOSIS:  Recurrent right breast cancer.   OPERATION/PROCEDURE:  Right total mastectomy.   SURGEON:  Dr. Francina Ames.   ASSISTANT:  Dr. Lorelee New.   ANESTHESIA:  General.   OPERATIVE PROCEDURE:  After suitable anesthesia was induced, the patient was  placed to the supine position with the arms extended on the arm board.  The  right breast, chest and axilla were prepped and draped in the usual fashion.  The transverse elliptical incisions were then outlined with the marking  pencil and then the incisions were made and we dissected the superior flap  towards the clavicle and under the lumpectomy scar where there was a lot of  scarring.  This did appear to be scar rather than recurrent cancer.  We  dissected medially to the sternum and inferiorly to the rectus fascia and  laterally to the latissimus dorsi muscle.  We then removed the breast by  dissecting from medial to lateral.  Throughout all of this, there was  considerable tissue reaction I think secondary to the previous radiation  therapy.  Following the completion of the removal of the breast, we  thoroughly irrigated the field with saline and we had good hemostasis  obtained with the cautery.  Two 19-French Blake drains were inserted and  brought out through separate stab wounds.   The skin was then stapled and the dressings applied and the patient  transferred to the recovery room in satisfactory condition having tolerated  the procedure well.                                     [name], [name]    PRY/MEDQ  D:  07/13/2004  T:  07/13/2004  Job:  [id number]

## 2011-03-25 NOTE — Op Note (Signed)
. [facility]  Patient:    [name], [name] Visit Number: [id number] MRN: [id number]          Service Type: DSU Location: [facility] Attending Physician:  Janalyn Rouse Dictated by:   [name], [name] Proc. Date: 04/03/02 Admit Date:  04/03/2002   CC:         Aliene Altes, [name]   Operative Report  PREOPERATIVE DIAGNOSIS:  Stage II carcinoma of the right breast.  POSTOPERATIVE DIAGNOSIS:  Stage II carcinoma of the right breast.  OPERATION PERFORMED: 1. Right partial mastectomy. 2. Right axillary lymph node dissection.  SURGEON:  [name], [name]  ANESTHESIA:  General.  INDICATIONS FOR PROCEDURE:  This patient had presented about a month ago with a palpable mass in the upper inner quadrant of her right breast.  Core biopsy showed infiltrating ductal carcinoma.  She had enlarged lymph nodes on ultrasound and fine needle aspiration showed metastatic disease.  She is scheduled now for a partial mastectomy and axillary lymph node dissection to be followed by chemotherapy and radiation therapy.  DESCRIPTION OF PROCEDURE:  After suitable general anesthesia was induced, the patient was placed in supine position with the right arm extended on the arm board.  The entire right breast, shoulder and axilla were prepped and draped in the usual fashion.  The palpable mass was in the upper inner quadrant of the right breast.  A curved incision overlying it was then outlined with a marking pencil and then incision made and then wide excision of the palpable tumor was carried out.  Specimens oriented for the pathologist and submitted for evaluation of margins.  While that was being done, a transverse right axillary incision was made with dissection through the subcutaneous tissues to the clavipectoral fascia.  We exposed the pectoralis major muscle and then incised along the pectoralis major muscle towards the axilla, retracting the major and then  incising along the minor.  The lateral pectoral nerve was preserved.  We then identified the axillary vein and then I dissected out all the tissue inferior to the vein and from beneath the pectoralis minor taking all of the level 1 and level 2 nodes. There were several palpable abnormal nodes.  The long thoracic and thoracodorsal nerves were identified and preserved and other vessels and nerves were clipped and divided.  Following removal of the specimen, we had good hemostasis.  We thoroughly irrigated the field with saline.  A 19 Blake drain was inserted and brought out through a separate stab wound. Subcutaneous tissues were closed with 3-0 Vicryl and the skin with staples.  Touch Preps on the margins of the lumpectomy site were clean, so I closed the incision with interrupted 4-0 Monocryls with Steri-Strips.  Dressings were then applied.  The patient was transferred to the recovery room in satisfactory condition, having tolerated the procedure well. Dictated by:   [name], [name] Attending Physician:  Janalyn Rouse DD:  04/03/02 TD:  04/04/02 Job: [id number]

## 2011-03-25 NOTE — Op Note (Signed)
[name], [name]                ACCOUNT NO.:  [id number]   MEDICAL RECORD NO.:  [id number]          PATIENT TYPE:  AMB   LOCATION:  DSC                          FACILITY:  [facility]   PHYSICIAN:  [name], [name]   DATE OF BIRTH:  02-21-1956   DATE OF PROCEDURE:  06/09/2005  DATE OF DISCHARGE:                                 OPERATIVE REPORT   PREOPERATIVE DIAGNOSIS:  History of breast cancer.   POSTOPERATIVE DIAGNOSIS:  History of breast cancer.   OPERATION:  Removal of Port-A-Cath.   SURGEON:  [name], [name]   ANESTHESIA:  Local.   OPERATIVE PROCEDURE:  The patient placed on the operating table and the left  upper chest prepped and draped in usual fashion.   Under local anesthesia, a transverse incision was made with exposure of the  port and the catheter. The catheter was removed from the subclavian vein  with no bleeding and then, with traction on it, the port was identified and  the two sutures divided and the port slipped out.   Incision was closed with subcuticular 4-0 Monocryl and Steri-Strips.   A dressing was applied and the patient allowed to go home.       PRY/MEDQ  D:  06/09/2005  T:  06/09/2005  Job:  [id number]

## 2011-03-25 NOTE — Op Note (Signed)
[place]  Patient:    [name], [name] Visit Number: [id number] MRN: [id number]          Service Type: DSU Location: [facility] Attending Physician:  Janalyn Rouse Dictated by:   [name], [name] Proc. Date: 05/27/02 Admit Date:  05/27/2002 Discharge Date: 05/27/2002   CC:         Aliene Altes, [name]  Jackelyn Knife, [name]   Operative Report  PREOPERATIVE DIAGNOSIS:   Carcinoma of the right breast.  POSTOPERATIVE DIAGNOSIS:  Carcinoma of the right breast.  OPERATION:  Re-excision of the medial portion of the previous lumpectomy site.  SURGEON:  [name], [name]  ANESTHESIA:  General.  INDICATIONS:  This patient had a previous partial mastectomy and lymph node dissection for stage 2 carcinoma of the right breast.  On an evaluation by the radiation therapy people, they felt like the margin was a bit close and asked for a re-excision of this.  DESCRIPTION OF PROCEDURE:  After suitable general anesthesia was induced, the patient was placed in the supine position and the right breast prepped and draped in the usual fashion.  I used the medial portion of our previous lumpectomy site and extended it medially which was centered in the upper inner quadrant of the right breast.  After dissecting into the subcutaneous tissue, I then did a wide excision of the most medial portion of her previous excision.  Hemostasis was obtained with the cautery.  Subcuticular closure with 4-0 Monocryl and Steri-Strips carried out.  Dressing applied.  The patient was transferred to the recovery room in satisfactory condition having tolerated the procedure well. Dictated by:   [name], [name] Attending Physician:  Janalyn Rouse DD:  05/27/02 TD:  05/29/02 Job: [id number]

## 2011-04-19 ENCOUNTER — Emergency Department (HOSPITAL_COMMUNITY)
Admission: EM | Admit: 2011-04-19 | Discharge: 2011-04-19 | Disposition: A | Discharge status: Home / Self Care | Payer: Medicare Other | Attending: Emergency Medicine | Admitting: Emergency Medicine

## 2011-04-19 DIAGNOSIS — R609 Edema, unspecified: Secondary | ICD-10-CM | POA: Insufficient documentation

## 2011-04-19 DIAGNOSIS — Z79899 Other long term (current) drug therapy: Secondary | ICD-10-CM | POA: Insufficient documentation

## 2011-04-19 DIAGNOSIS — M7989 Other specified soft tissue disorders: Secondary | ICD-10-CM | POA: Insufficient documentation

## 2011-04-19 DIAGNOSIS — M79609 Pain in unspecified limb: Secondary | ICD-10-CM | POA: Insufficient documentation

## 2011-04-19 DIAGNOSIS — Z853 Personal history of malignant neoplasm of breast: Secondary | ICD-10-CM | POA: Insufficient documentation

## 2011-04-19 DIAGNOSIS — I890 Lymphedema, not elsewhere classified: Secondary | ICD-10-CM | POA: Insufficient documentation

## 2011-04-19 DIAGNOSIS — Z9889 Other specified postprocedural states: Secondary | ICD-10-CM | POA: Insufficient documentation

## 2011-04-20 ENCOUNTER — Encounter (HOSPITAL_BASED_OUTPATIENT_CLINIC_OR_DEPARTMENT_OTHER): Payer: Medicare Other | Admitting: Oncology

## 2011-04-20 ENCOUNTER — Other Ambulatory Visit: Payer: Self-pay | Admitting: Oncology

## 2011-04-20 ENCOUNTER — Ambulatory Visit (HOSPITAL_COMMUNITY)
Admission: RE | Admit: 2011-04-20 | Discharge: 2011-04-20 | Disposition: A | Discharge status: Home / Self Care | Payer: Medicare Other | Source: Ambulatory Visit | Attending: Oncology | Admitting: Oncology

## 2011-04-20 DIAGNOSIS — C50219 Malignant neoplasm of upper-inner quadrant of unspecified female breast: Secondary | ICD-10-CM

## 2011-04-20 DIAGNOSIS — C50919 Malignant neoplasm of unspecified site of unspecified female breast: Secondary | ICD-10-CM | POA: Insufficient documentation

## 2011-04-20 DIAGNOSIS — M7989 Other specified soft tissue disorders: Secondary | ICD-10-CM | POA: Insufficient documentation

## 2011-04-20 DIAGNOSIS — C801 Malignant (primary) neoplasm, unspecified: Secondary | ICD-10-CM

## 2011-04-20 DIAGNOSIS — M79609 Pain in unspecified limb: Secondary | ICD-10-CM | POA: Insufficient documentation

## 2011-04-20 DIAGNOSIS — I82629 Acute embolism and thrombosis of deep veins of unspecified upper extremity: Secondary | ICD-10-CM

## 2011-04-20 DIAGNOSIS — Z7901 Long term (current) use of anticoagulants: Secondary | ICD-10-CM

## 2011-04-20 LAB — COMPREHENSIVE METABOLIC PANEL
ALT: 10 U/L (ref 0–35)
AST: 18 U/L (ref 0–37)
Albumin: 4 g/dL (ref 3.5–5.2)
Alkaline Phosphatase: 89 U/L (ref 39–117)
BUN: 11 mg/dL (ref 6–23)
CO2: 24 mEq/L (ref 19–32)
Calcium: 9.5 mg/dL (ref 8.4–10.5)
Chloride: 103 mEq/L (ref 96–112)
Creatinine, Ser: 0.78 mg/dL (ref 0.50–1.10)
Glucose, Bld: 115 mg/dL — ABNORMAL HIGH (ref 70–99)
Potassium: 3.8 mEq/L (ref 3.5–5.3)
Sodium: 143 mEq/L (ref 135–145)
Total Bilirubin: 0.3 mg/dL (ref 0.3–1.2)
Total Protein: 7.1 g/dL (ref 6.0–8.3)

## 2011-04-20 LAB — CBC WITH DIFFERENTIAL/PLATELET
BASO%: 0.8 % (ref 0.0–2.0)
Basophils Absolute: 0.1 10*3/uL (ref 0.0–0.1)
EOS%: 2.3 % (ref 0.0–7.0)
Eosinophils Absolute: 0.2 10*3/uL (ref 0.0–0.5)
HCT: 40.5 % (ref 34.8–46.6)
HGB: 13.6 g/dL (ref 11.6–15.9)
LYMPH%: 19.8 % (ref 14.0–49.7)
MCH: 28.2 pg (ref 25.1–34.0)
MCHC: 33.6 g/dL (ref 31.5–36.0)
MCV: 84 fL (ref 79.5–101.0)
MONO#: 0.5 10*3/uL (ref 0.1–0.9)
MONO%: 6.9 % (ref 0.0–14.0)
NEUT#: 5.1 10*3/uL (ref 1.5–6.5)
NEUT%: 70.2 % (ref 38.4–76.8)
Platelets: 288 10*3/uL (ref 145–400)
RBC: 4.82 10*6/uL (ref 3.70–5.45)
RDW: 14.3 % (ref 11.2–14.5)
WBC: 7.3 10*3/uL (ref 3.9–10.3)
lymph#: 1.4 10*3/uL (ref 0.9–3.3)
nRBC: 0 % (ref 0–0)

## 2011-04-20 LAB — LACTATE DEHYDROGENASE: LDH: 168 U/L (ref 94–250)

## 2011-04-20 LAB — CANCER ANTIGEN 27.29: CA 27.29: 52 U/mL — ABNORMAL HIGH (ref 0–39)

## 2011-04-21 ENCOUNTER — Encounter (HOSPITAL_BASED_OUTPATIENT_CLINIC_OR_DEPARTMENT_OTHER): Payer: Medicare Other | Admitting: Oncology

## 2011-04-21 DIAGNOSIS — C801 Malignant (primary) neoplasm, unspecified: Secondary | ICD-10-CM

## 2011-04-21 DIAGNOSIS — Z5111 Encounter for antineoplastic chemotherapy: Secondary | ICD-10-CM

## 2011-04-21 DIAGNOSIS — C50219 Malignant neoplasm of upper-inner quadrant of unspecified female breast: Secondary | ICD-10-CM

## 2011-04-21 DIAGNOSIS — Z7901 Long term (current) use of anticoagulants: Secondary | ICD-10-CM

## 2011-04-22 ENCOUNTER — Encounter (HOSPITAL_BASED_OUTPATIENT_CLINIC_OR_DEPARTMENT_OTHER): Payer: Medicare Other | Admitting: Oncology

## 2011-04-22 DIAGNOSIS — C801 Malignant (primary) neoplasm, unspecified: Secondary | ICD-10-CM

## 2011-04-22 DIAGNOSIS — Z7901 Long term (current) use of anticoagulants: Secondary | ICD-10-CM

## 2011-04-22 DIAGNOSIS — Z5111 Encounter for antineoplastic chemotherapy: Secondary | ICD-10-CM

## 2011-04-22 DIAGNOSIS — C50219 Malignant neoplasm of upper-inner quadrant of unspecified female breast: Secondary | ICD-10-CM

## 2011-04-23 ENCOUNTER — Encounter (HOSPITAL_BASED_OUTPATIENT_CLINIC_OR_DEPARTMENT_OTHER): Payer: Medicare Other | Admitting: Oncology

## 2011-04-23 DIAGNOSIS — C50219 Malignant neoplasm of upper-inner quadrant of unspecified female breast: Secondary | ICD-10-CM

## 2011-04-23 DIAGNOSIS — Z7901 Long term (current) use of anticoagulants: Secondary | ICD-10-CM

## 2011-04-24 ENCOUNTER — Encounter (HOSPITAL_BASED_OUTPATIENT_CLINIC_OR_DEPARTMENT_OTHER): Payer: Medicare Other | Admitting: Oncology

## 2011-04-24 DIAGNOSIS — C50219 Malignant neoplasm of upper-inner quadrant of unspecified female breast: Secondary | ICD-10-CM

## 2011-04-24 DIAGNOSIS — Z86718 Personal history of other venous thrombosis and embolism: Secondary | ICD-10-CM

## 2011-04-25 ENCOUNTER — Other Ambulatory Visit: Payer: Self-pay | Admitting: Oncology

## 2011-04-25 ENCOUNTER — Encounter (HOSPITAL_BASED_OUTPATIENT_CLINIC_OR_DEPARTMENT_OTHER): Payer: Medicare Other | Admitting: Oncology

## 2011-04-25 ENCOUNTER — Ambulatory Visit (HOSPITAL_COMMUNITY)
Admission: RE | Admit: 2011-04-25 | Discharge: 2011-04-25 | Disposition: A | Discharge status: Home / Self Care | Payer: Medicare Other | Source: Ambulatory Visit | Attending: Oncology | Admitting: Oncology

## 2011-04-25 DIAGNOSIS — J984 Other disorders of lung: Secondary | ICD-10-CM | POA: Insufficient documentation

## 2011-04-25 DIAGNOSIS — C50219 Malignant neoplasm of upper-inner quadrant of unspecified female breast: Secondary | ICD-10-CM

## 2011-04-25 DIAGNOSIS — R012 Other cardiac sounds: Secondary | ICD-10-CM | POA: Insufficient documentation

## 2011-04-25 DIAGNOSIS — Z9010 Acquired absence of unspecified breast and nipple: Secondary | ICD-10-CM | POA: Insufficient documentation

## 2011-04-25 DIAGNOSIS — I890 Lymphedema, not elsewhere classified: Secondary | ICD-10-CM | POA: Insufficient documentation

## 2011-04-25 DIAGNOSIS — Z86718 Personal history of other venous thrombosis and embolism: Secondary | ICD-10-CM

## 2011-04-25 DIAGNOSIS — C50919 Malignant neoplasm of unspecified site of unspecified female breast: Secondary | ICD-10-CM | POA: Insufficient documentation

## 2011-04-25 DIAGNOSIS — E236 Other disorders of pituitary gland: Secondary | ICD-10-CM | POA: Insufficient documentation

## 2011-04-25 MED ORDER — IOHEXOL 300 MG/ML  SOLN
100.0000 mL | Freq: Once | INTRAMUSCULAR | Status: AC | PRN
  Administered 2011-04-25: 100 mL via INTRAVENOUS

## 2011-04-26 ENCOUNTER — Encounter (HOSPITAL_BASED_OUTPATIENT_CLINIC_OR_DEPARTMENT_OTHER): Payer: Medicare Other | Admitting: Oncology

## 2011-04-26 DIAGNOSIS — Z86718 Personal history of other venous thrombosis and embolism: Secondary | ICD-10-CM

## 2011-04-26 DIAGNOSIS — C50219 Malignant neoplasm of upper-inner quadrant of unspecified female breast: Secondary | ICD-10-CM

## 2011-04-29 ENCOUNTER — Other Ambulatory Visit: Payer: Self-pay | Admitting: Oncology

## 2011-04-29 ENCOUNTER — Encounter (HOSPITAL_BASED_OUTPATIENT_CLINIC_OR_DEPARTMENT_OTHER): Payer: Medicare Other | Admitting: Oncology

## 2011-04-29 DIAGNOSIS — Z7901 Long term (current) use of anticoagulants: Secondary | ICD-10-CM

## 2011-04-29 DIAGNOSIS — C801 Malignant (primary) neoplasm, unspecified: Secondary | ICD-10-CM

## 2011-04-29 DIAGNOSIS — C50219 Malignant neoplasm of upper-inner quadrant of unspecified female breast: Secondary | ICD-10-CM

## 2011-04-29 DIAGNOSIS — C50919 Malignant neoplasm of unspecified site of unspecified female breast: Secondary | ICD-10-CM

## 2011-04-29 DIAGNOSIS — Z5181 Encounter for therapeutic drug level monitoring: Secondary | ICD-10-CM

## 2011-04-29 LAB — COMPREHENSIVE METABOLIC PANEL
ALT: 11 U/L (ref 0–35)
AST: 18 U/L (ref 0–37)
Albumin: 4.2 g/dL (ref 3.5–5.2)
Alkaline Phosphatase: 84 U/L (ref 39–117)
BUN: 10 mg/dL (ref 6–23)
CO2: 27 mEq/L (ref 19–32)
Calcium: 9.6 mg/dL (ref 8.4–10.5)
Chloride: 104 mEq/L (ref 96–112)
Creatinine, Ser: 0.84 mg/dL (ref 0.50–1.10)
Glucose, Bld: 87 mg/dL (ref 70–99)
Potassium: 3.7 mEq/L (ref 3.5–5.3)
Sodium: 140 mEq/L (ref 135–145)
Total Bilirubin: 0.2 mg/dL — ABNORMAL LOW (ref 0.3–1.2)
Total Protein: 6.7 g/dL (ref 6.0–8.3)

## 2011-04-29 LAB — CBC WITH DIFFERENTIAL/PLATELET
BASO%: 0.8 % (ref 0.0–2.0)
Basophils Absolute: 0 10*3/uL (ref 0.0–0.1)
EOS%: 2.7 % (ref 0.0–7.0)
Eosinophils Absolute: 0.1 10*3/uL (ref 0.0–0.5)
HCT: 38.7 % (ref 34.8–46.6)
HGB: 12.7 g/dL (ref 11.6–15.9)
LYMPH%: 27.7 % (ref 14.0–49.7)
MCH: 28.1 pg (ref 25.1–34.0)
MCHC: 32.8 g/dL (ref 31.5–36.0)
MCV: 85.6 fL (ref 79.5–101.0)
MONO#: 0.3 10*3/uL (ref 0.1–0.9)
MONO%: 4.7 % (ref 0.0–14.0)
NEUT#: 3.4 10*3/uL (ref 1.5–6.5)
NEUT%: 64.1 % (ref 38.4–76.8)
Platelets: 315 10*3/uL (ref 145–400)
RBC: 4.52 10*6/uL (ref 3.70–5.45)
RDW: 14.4 % (ref 11.2–14.5)
WBC: 5.3 10*3/uL (ref 3.9–10.3)
lymph#: 1.5 10*3/uL (ref 0.9–3.3)

## 2011-04-29 LAB — PROTHROMBIN TIME
INR: 5.84 (ref <1.50)
Prothrombin Time: 53.2 seconds — ABNORMAL HIGH (ref 11.6–15.2)

## 2011-04-29 LAB — PROTIME-INR

## 2011-05-02 ENCOUNTER — Other Ambulatory Visit: Payer: Self-pay | Admitting: Oncology

## 2011-05-02 ENCOUNTER — Encounter (HOSPITAL_BASED_OUTPATIENT_CLINIC_OR_DEPARTMENT_OTHER): Payer: Medicare Other | Admitting: Oncology

## 2011-05-02 DIAGNOSIS — C801 Malignant (primary) neoplasm, unspecified: Secondary | ICD-10-CM

## 2011-05-02 DIAGNOSIS — Z7901 Long term (current) use of anticoagulants: Secondary | ICD-10-CM

## 2011-05-02 LAB — PROTIME-INR
INR: 2.8 (ref 2.00–3.50)
Protime: 33.6 Seconds — ABNORMAL HIGH (ref 10.6–13.4)

## 2011-05-06 ENCOUNTER — Other Ambulatory Visit: Payer: Self-pay | Admitting: Physician Assistant

## 2011-05-06 ENCOUNTER — Other Ambulatory Visit: Payer: Self-pay | Admitting: Oncology

## 2011-05-06 ENCOUNTER — Encounter (HOSPITAL_BASED_OUTPATIENT_CLINIC_OR_DEPARTMENT_OTHER): Payer: Medicare Other | Admitting: Oncology

## 2011-05-06 DIAGNOSIS — Z86718 Personal history of other venous thrombosis and embolism: Secondary | ICD-10-CM

## 2011-05-06 DIAGNOSIS — Z7901 Long term (current) use of anticoagulants: Secondary | ICD-10-CM

## 2011-05-06 DIAGNOSIS — C801 Malignant (primary) neoplasm, unspecified: Secondary | ICD-10-CM

## 2011-05-06 DIAGNOSIS — Z5181 Encounter for therapeutic drug level monitoring: Secondary | ICD-10-CM

## 2011-05-06 DIAGNOSIS — C50219 Malignant neoplasm of upper-inner quadrant of unspecified female breast: Secondary | ICD-10-CM

## 2011-05-06 LAB — CBC WITH DIFFERENTIAL/PLATELET
BASO%: 0.8 % (ref 0.0–2.0)
Basophils Absolute: 0.1 10*3/uL (ref 0.0–0.1)
EOS%: 2.2 % (ref 0.0–7.0)
Eosinophils Absolute: 0.2 10*3/uL (ref 0.0–0.5)
HCT: 38.5 % (ref 34.8–46.6)
HGB: 13.1 g/dL (ref 11.6–15.9)
LYMPH%: 36.4 % (ref 14.0–49.7)
MCH: 28.7 pg (ref 25.1–34.0)
MCHC: 34 g/dL (ref 31.5–36.0)
MCV: 84.4 fL (ref 79.5–101.0)
MONO#: 0.6 10*3/uL (ref 0.1–0.9)
MONO%: 8.3 % (ref 0.0–14.0)
NEUT#: 3.9 10*3/uL (ref 1.5–6.5)
NEUT%: 52.3 % (ref 38.4–76.8)
Platelets: 349 10*3/uL (ref 145–400)
RBC: 4.56 10*6/uL (ref 3.70–5.45)
RDW: 14.4 % (ref 11.2–14.5)
WBC: 7.4 10*3/uL (ref 3.9–10.3)
lymph#: 2.7 10*3/uL (ref 0.9–3.3)

## 2011-05-06 LAB — COMPREHENSIVE METABOLIC PANEL
ALT: 9 U/L (ref 0–35)
AST: 18 U/L (ref 0–37)
Albumin: 3.6 g/dL (ref 3.5–5.2)
Alkaline Phosphatase: 93 U/L (ref 39–117)
BUN: 12 mg/dL (ref 6–23)
CO2: 30 mEq/L (ref 19–32)
Calcium: 9.3 mg/dL (ref 8.4–10.5)
Chloride: 100 mEq/L (ref 96–112)
Creatinine, Ser: 1.01 mg/dL (ref 0.50–1.10)
Glucose, Bld: 76 mg/dL (ref 70–99)
Potassium: 3.3 mEq/L — ABNORMAL LOW (ref 3.5–5.3)
Sodium: 141 mEq/L (ref 135–145)
Total Bilirubin: 0.2 mg/dL — ABNORMAL LOW (ref 0.3–1.2)
Total Protein: 6.6 g/dL (ref 6.0–8.3)

## 2011-05-06 LAB — CANCER ANTIGEN 27.29: CA 27.29: 40 U/mL — ABNORMAL HIGH (ref 0–39)

## 2011-05-06 LAB — PROTIME-INR
INR: 2.2 (ref 2.00–3.50)
Protime: 26.4 Seconds — ABNORMAL HIGH (ref 10.6–13.4)

## 2011-05-08 ENCOUNTER — Emergency Department (HOSPITAL_COMMUNITY)
Admission: EM | Admit: 2011-05-08 | Discharge: 2011-05-08 | Discharge status: Against Medical Advice | Payer: Medicare Other | Attending: Emergency Medicine | Admitting: Emergency Medicine

## 2011-05-09 ENCOUNTER — Encounter (HOSPITAL_BASED_OUTPATIENT_CLINIC_OR_DEPARTMENT_OTHER): Payer: Medicare Other | Admitting: Oncology

## 2011-05-09 DIAGNOSIS — Z5111 Encounter for antineoplastic chemotherapy: Secondary | ICD-10-CM

## 2011-05-09 DIAGNOSIS — C50219 Malignant neoplasm of upper-inner quadrant of unspecified female breast: Secondary | ICD-10-CM

## 2011-05-13 ENCOUNTER — Other Ambulatory Visit: Payer: Self-pay | Admitting: Physician Assistant

## 2011-05-13 ENCOUNTER — Encounter (HOSPITAL_BASED_OUTPATIENT_CLINIC_OR_DEPARTMENT_OTHER): Payer: Medicare Other | Admitting: Oncology

## 2011-05-13 DIAGNOSIS — C801 Malignant (primary) neoplasm, unspecified: Secondary | ICD-10-CM

## 2011-05-13 DIAGNOSIS — I82409 Acute embolism and thrombosis of unspecified deep veins of unspecified lower extremity: Secondary | ICD-10-CM

## 2011-05-13 DIAGNOSIS — Z7901 Long term (current) use of anticoagulants: Secondary | ICD-10-CM

## 2011-05-13 DIAGNOSIS — C7981 Secondary malignant neoplasm of breast: Secondary | ICD-10-CM

## 2011-05-13 LAB — CBC WITH DIFFERENTIAL/PLATELET
BASO%: 0.6 % (ref 0.0–2.0)
Basophils Absolute: 0 10*3/uL (ref 0.0–0.1)
EOS%: 3.2 % (ref 0.0–7.0)
Eosinophils Absolute: 0.2 10*3/uL (ref 0.0–0.5)
HCT: 37.5 % (ref 34.8–46.6)
HGB: 12.3 g/dL (ref 11.6–15.9)
LYMPH%: 26.5 % (ref 14.0–49.7)
MCH: 28.2 pg (ref 25.1–34.0)
MCHC: 32.8 g/dL (ref 31.5–36.0)
MCV: 86 fL (ref 79.5–101.0)
MONO#: 0.3 10*3/uL (ref 0.1–0.9)
MONO%: 5 % (ref 0.0–14.0)
NEUT#: 4.4 10*3/uL (ref 1.5–6.5)
NEUT%: 64.7 % (ref 38.4–76.8)
Platelets: 186 10*3/uL (ref 145–400)
RBC: 4.36 10*6/uL (ref 3.70–5.45)
RDW: 14.3 % (ref 11.2–14.5)
WBC: 6.9 10*3/uL (ref 3.9–10.3)
lymph#: 1.8 10*3/uL (ref 0.9–3.3)
nRBC: 0 % (ref 0–0)

## 2011-05-13 LAB — PROTIME-INR
INR: 1.6 — ABNORMAL LOW (ref 2.00–3.50)
Protime: 19.2 Seconds — ABNORMAL HIGH (ref 10.6–13.4)

## 2011-05-14 ENCOUNTER — Inpatient Hospital Stay (INDEPENDENT_AMBULATORY_CARE_PROVIDER_SITE_OTHER)
Admission: RE | Admit: 2011-05-14 | Discharge: 2011-05-14 | Disposition: A | Discharge status: Home / Self Care | Payer: Medicare Other | Source: Ambulatory Visit | Attending: Family Medicine | Admitting: Family Medicine

## 2011-05-14 DIAGNOSIS — I890 Lymphedema, not elsewhere classified: Secondary | ICD-10-CM

## 2011-05-14 IMAGING — CR DG TIBIA/FIBULA 2V*L*
3 series · 3 of 3 positions shown · non-contrast
Comparison: None.

CLINICAL DATA: Pain the medial left lower leg, injured many years
ago

LEFT TIBIA AND FIBULA - 2 VIEW

[view not recorded (1 of 3)]
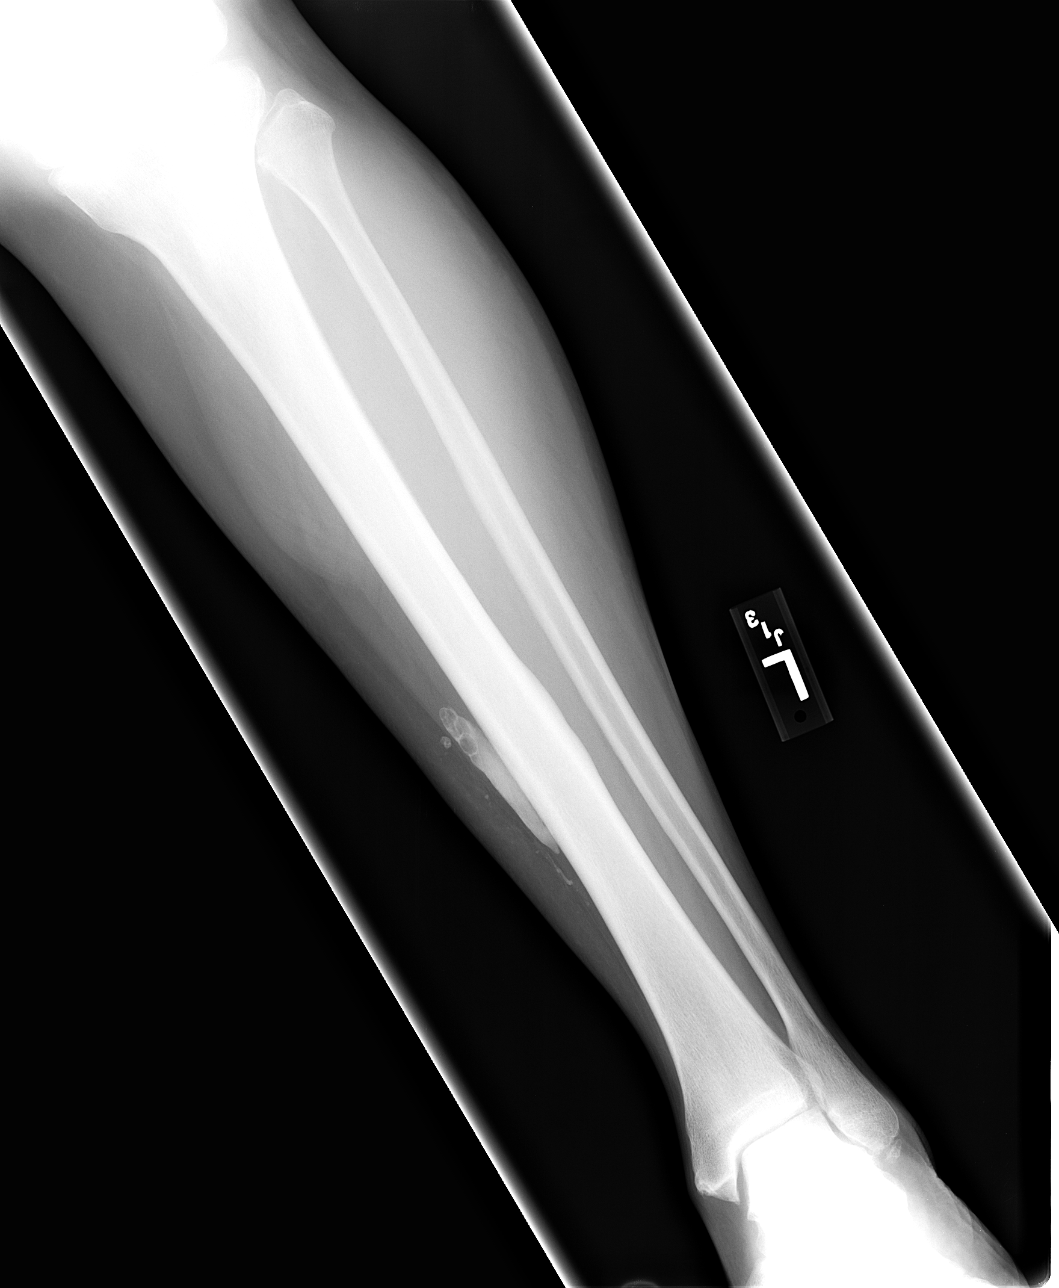

[view not recorded (2 of 3)]
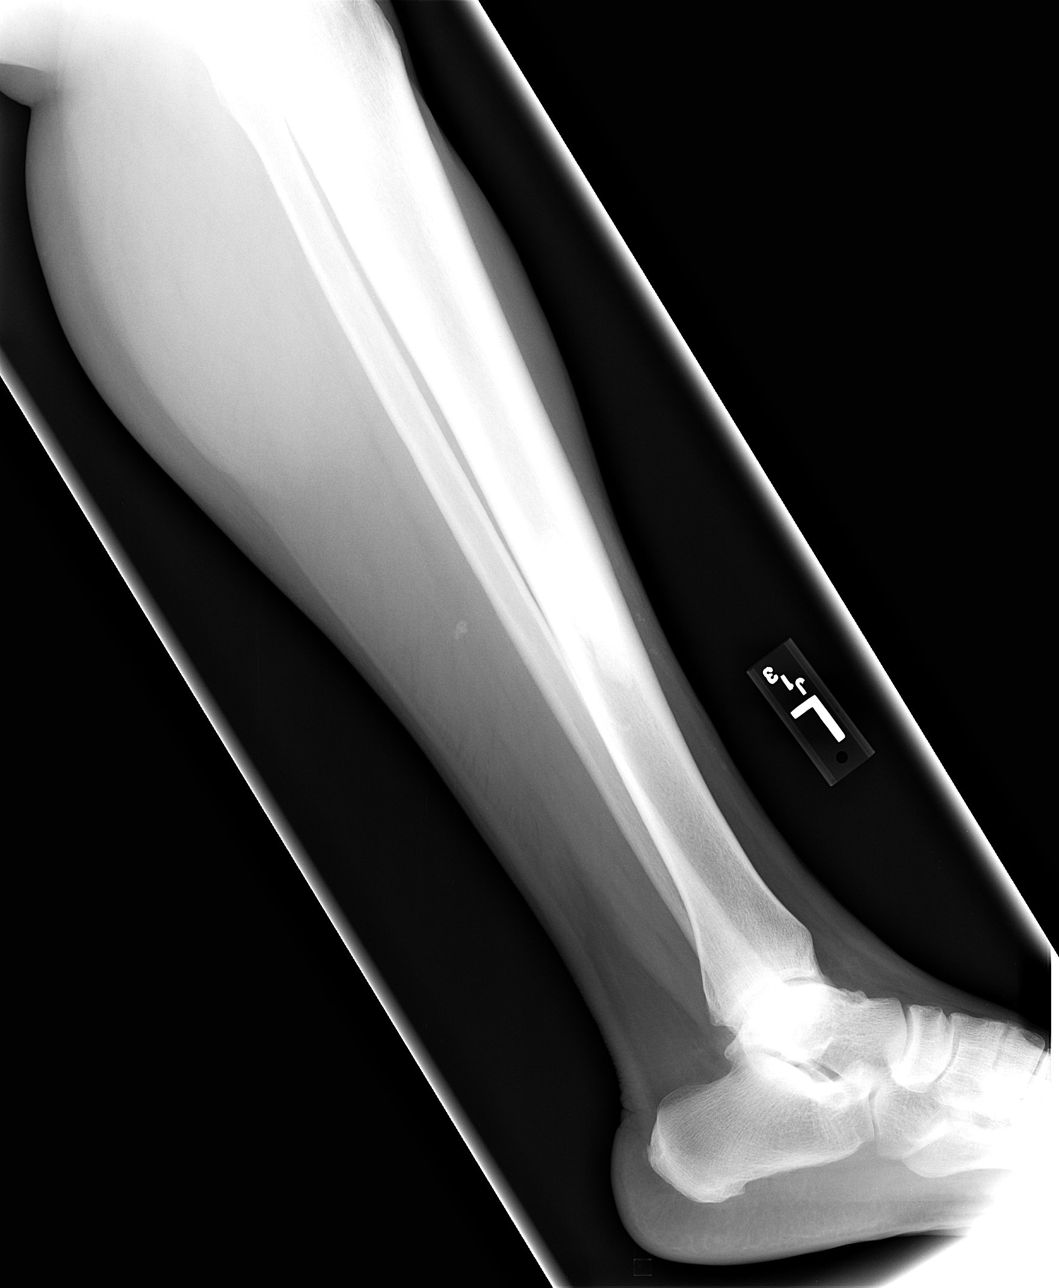

[view not recorded (3 of 3)]
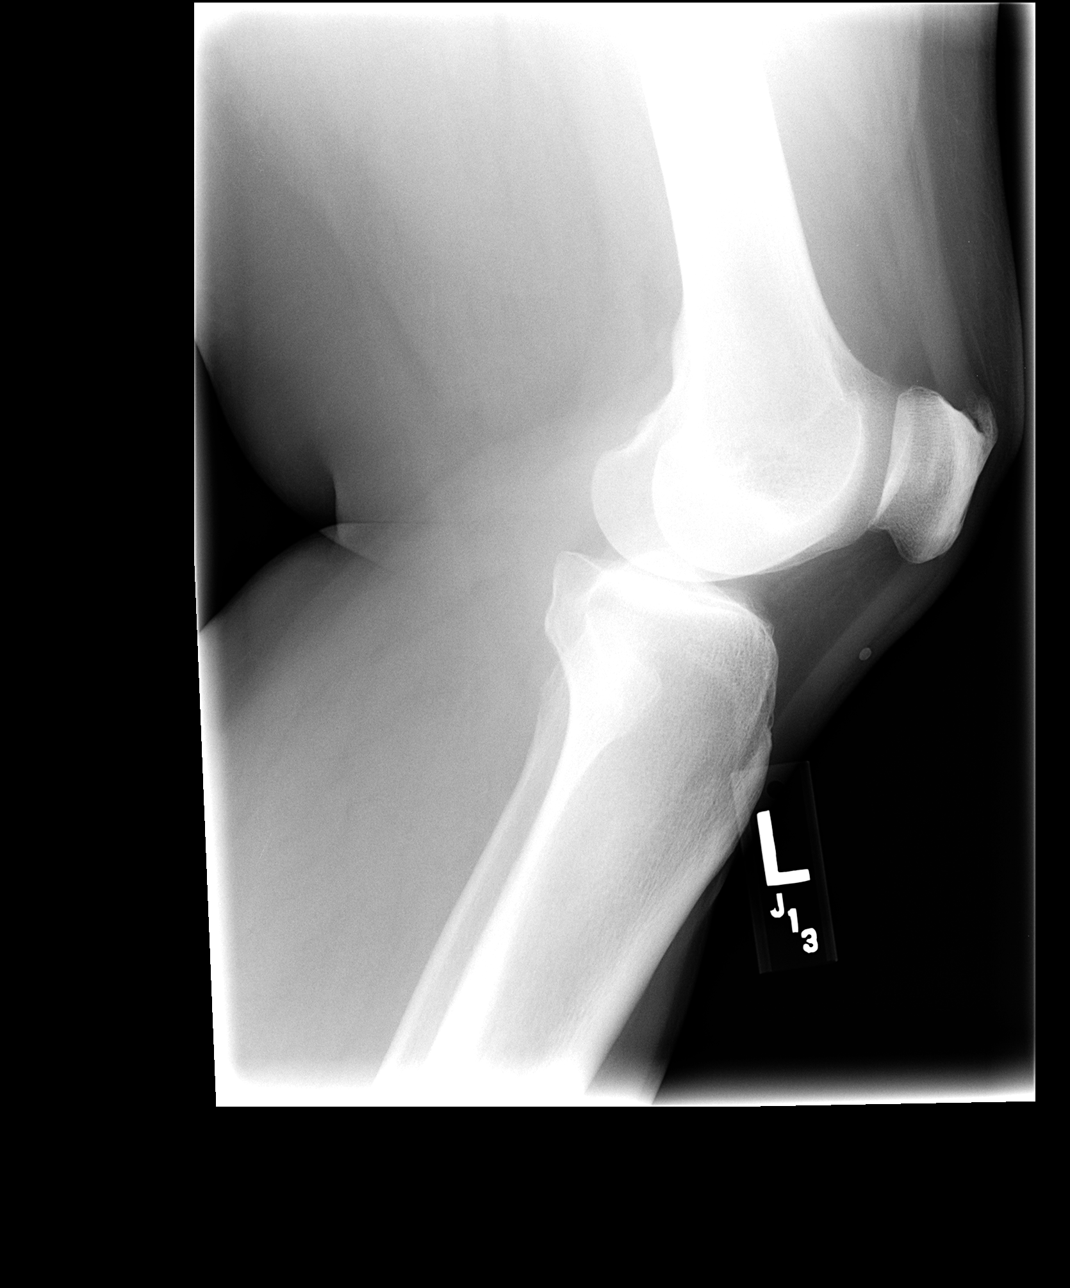

[3 of 3 positions shown; findings below may reference images not displayed]

FINDINGS: The tibia and fibula are in normal alignment.  No acute
fracture is seen.  There is soft tissue calcification just medial
to the mid distal left tibia.  This probably represents myositis
ossificans secondary to prior trauma.  Degenerative spurring is
noted from the superior aspect of the patella.
IMPRESSION: Probable myositis ossificans medially in the soft tissues of the
left mid lower leg.  No acute abnormality.

## 2011-05-20 ENCOUNTER — Other Ambulatory Visit: Payer: Self-pay | Admitting: Physician Assistant

## 2011-05-20 ENCOUNTER — Other Ambulatory Visit: Payer: Self-pay | Admitting: Oncology

## 2011-05-20 ENCOUNTER — Encounter (HOSPITAL_BASED_OUTPATIENT_CLINIC_OR_DEPARTMENT_OTHER): Payer: Medicare Other | Admitting: Oncology

## 2011-05-20 DIAGNOSIS — Z7901 Long term (current) use of anticoagulants: Secondary | ICD-10-CM

## 2011-05-20 DIAGNOSIS — C50919 Malignant neoplasm of unspecified site of unspecified female breast: Secondary | ICD-10-CM

## 2011-05-20 DIAGNOSIS — C50219 Malignant neoplasm of upper-inner quadrant of unspecified female breast: Secondary | ICD-10-CM

## 2011-05-20 DIAGNOSIS — Z5181 Encounter for therapeutic drug level monitoring: Secondary | ICD-10-CM

## 2011-05-20 DIAGNOSIS — I82629 Acute embolism and thrombosis of deep veins of unspecified upper extremity: Secondary | ICD-10-CM

## 2011-05-20 LAB — COMPREHENSIVE METABOLIC PANEL
ALT: 25 U/L (ref 0–35)
AST: 32 U/L (ref 0–37)
Albumin: 4.1 g/dL (ref 3.5–5.2)
Alkaline Phosphatase: 113 U/L (ref 39–117)
BUN: 11 mg/dL (ref 6–23)
CO2: 32 mEq/L (ref 19–32)
Calcium: 9.4 mg/dL (ref 8.4–10.5)
Chloride: 99 mEq/L (ref 96–112)
Creatinine, Ser: 0.76 mg/dL (ref 0.50–1.10)
Glucose, Bld: 89 mg/dL (ref 70–99)
Potassium: 3.7 mEq/L (ref 3.5–5.3)
Sodium: 141 mEq/L (ref 135–145)
Total Bilirubin: 0.2 mg/dL — ABNORMAL LOW (ref 0.3–1.2)
Total Protein: 6.9 g/dL (ref 6.0–8.3)

## 2011-05-20 LAB — PROTIME-INR

## 2011-05-20 LAB — CBC WITH DIFFERENTIAL/PLATELET
BASO%: 0.4 % (ref 0.0–2.0)
Basophils Absolute: 0 10*3/uL (ref 0.0–0.1)
EOS%: 5.2 % (ref 0.0–7.0)
Eosinophils Absolute: 0.4 10*3/uL (ref 0.0–0.5)
HCT: 36.8 % (ref 34.8–46.6)
HGB: 12 g/dL (ref 11.6–15.9)
LYMPH%: 26.7 % (ref 14.0–49.7)
MCH: 28 pg (ref 25.1–34.0)
MCHC: 32.6 g/dL (ref 31.5–36.0)
MCV: 86 fL (ref 79.5–101.0)
MONO#: 0.8 10*3/uL (ref 0.1–0.9)
MONO%: 11.5 % (ref 0.0–14.0)
NEUT#: 4 10*3/uL (ref 1.5–6.5)
NEUT%: 56.2 % (ref 38.4–76.8)
Platelets: 283 10*3/uL (ref 145–400)
RBC: 4.28 10*6/uL (ref 3.70–5.45)
RDW: 14.4 % (ref 11.2–14.5)
WBC: 7.2 10*3/uL (ref 3.9–10.3)
lymph#: 1.9 10*3/uL (ref 0.9–3.3)
nRBC: 0 % (ref 0–0)

## 2011-05-20 LAB — PROTHROMBIN TIME
INR: 4.41 — ABNORMAL HIGH (ref <1.50)
Prothrombin Time: 42.7 seconds — ABNORMAL HIGH (ref 11.6–15.2)

## 2011-05-27 ENCOUNTER — Encounter (HOSPITAL_BASED_OUTPATIENT_CLINIC_OR_DEPARTMENT_OTHER): Payer: Medicare Other | Admitting: Oncology

## 2011-05-27 ENCOUNTER — Other Ambulatory Visit: Payer: Self-pay | Admitting: Physician Assistant

## 2011-05-27 DIAGNOSIS — C50219 Malignant neoplasm of upper-inner quadrant of unspecified female breast: Secondary | ICD-10-CM

## 2011-05-27 DIAGNOSIS — Z5111 Encounter for antineoplastic chemotherapy: Secondary | ICD-10-CM

## 2011-05-27 DIAGNOSIS — I82629 Acute embolism and thrombosis of deep veins of unspecified upper extremity: Secondary | ICD-10-CM

## 2011-05-27 DIAGNOSIS — C801 Malignant (primary) neoplasm, unspecified: Secondary | ICD-10-CM

## 2011-05-27 DIAGNOSIS — Z5112 Encounter for antineoplastic immunotherapy: Secondary | ICD-10-CM

## 2011-05-27 DIAGNOSIS — Z7901 Long term (current) use of anticoagulants: Secondary | ICD-10-CM

## 2011-05-27 LAB — CBC WITH DIFFERENTIAL/PLATELET
BASO%: 1.3 % (ref 0.0–2.0)
Basophils Absolute: 0.1 10*3/uL (ref 0.0–0.1)
EOS%: 4.2 % (ref 0.0–7.0)
Eosinophils Absolute: 0.3 10*3/uL (ref 0.0–0.5)
HCT: 37.9 % (ref 34.8–46.6)
HGB: 12.6 g/dL (ref 11.6–15.9)
LYMPH%: 30.5 % (ref 14.0–49.7)
MCH: 28.2 pg (ref 25.1–34.0)
MCHC: 33.2 g/dL (ref 31.5–36.0)
MCV: 84.8 fL (ref 79.5–101.0)
MONO#: 0.9 10*3/uL (ref 0.1–0.9)
MONO%: 13.8 % (ref 0.0–14.0)
NEUT#: 3.1 10*3/uL (ref 1.5–6.5)
NEUT%: 50.2 % (ref 38.4–76.8)
Platelets: 379 10*3/uL (ref 145–400)
RBC: 4.47 10*6/uL (ref 3.70–5.45)
RDW: 14.6 % — ABNORMAL HIGH (ref 11.2–14.5)
WBC: 6.2 10*3/uL (ref 3.9–10.3)
lymph#: 1.9 10*3/uL (ref 0.9–3.3)

## 2011-05-27 LAB — PROTIME-INR
INR: 1.9 — ABNORMAL LOW (ref 2.00–3.50)
Protime: 22.8 Seconds — ABNORMAL HIGH (ref 10.6–13.4)

## 2011-06-03 ENCOUNTER — Other Ambulatory Visit: Payer: Self-pay | Admitting: Physician Assistant

## 2011-06-03 ENCOUNTER — Encounter (HOSPITAL_BASED_OUTPATIENT_CLINIC_OR_DEPARTMENT_OTHER): Payer: Medicare Other | Admitting: Oncology

## 2011-06-03 DIAGNOSIS — C801 Malignant (primary) neoplasm, unspecified: Secondary | ICD-10-CM

## 2011-06-03 DIAGNOSIS — Z7901 Long term (current) use of anticoagulants: Secondary | ICD-10-CM

## 2011-06-03 DIAGNOSIS — C50219 Malignant neoplasm of upper-inner quadrant of unspecified female breast: Secondary | ICD-10-CM

## 2011-06-03 LAB — CBC WITH DIFFERENTIAL/PLATELET
BASO%: 0.6 % (ref 0.0–2.0)
Basophils Absolute: 0 10*3/uL (ref 0.0–0.1)
EOS%: 1.5 % (ref 0.0–7.0)
Eosinophils Absolute: 0.1 10*3/uL (ref 0.0–0.5)
HCT: 39 % (ref 34.8–46.6)
HGB: 13.1 g/dL (ref 11.6–15.9)
LYMPH%: 18 % (ref 14.0–49.7)
MCH: 28.3 pg (ref 25.1–34.0)
MCHC: 33.6 g/dL (ref 31.5–36.0)
MCV: 84.2 fL (ref 79.5–101.0)
MONO#: 0.3 10*3/uL (ref 0.1–0.9)
MONO%: 4.6 % (ref 0.0–14.0)
NEUT#: 4.9 10*3/uL (ref 1.5–6.5)
NEUT%: 75.3 % (ref 38.4–76.8)
Platelets: 291 10*3/uL (ref 145–400)
RBC: 4.63 10*6/uL (ref 3.70–5.45)
RDW: 14.1 % (ref 11.2–14.5)
WBC: 6.6 10*3/uL (ref 3.9–10.3)
lymph#: 1.2 10*3/uL (ref 0.9–3.3)
nRBC: 0 % (ref 0–0)

## 2011-06-03 LAB — COMPREHENSIVE METABOLIC PANEL
ALT: 18 U/L (ref 0–35)
AST: 25 U/L (ref 0–37)
Albumin: 4 g/dL (ref 3.5–5.2)
Alkaline Phosphatase: 98 U/L (ref 39–117)
BUN: 12 mg/dL (ref 6–23)
CO2: 25 mEq/L (ref 19–32)
Calcium: 9.6 mg/dL (ref 8.4–10.5)
Chloride: 105 mEq/L (ref 96–112)
Creatinine, Ser: 0.81 mg/dL (ref 0.50–1.10)
Glucose, Bld: 114 mg/dL — ABNORMAL HIGH (ref 70–99)
Potassium: 3.6 mEq/L (ref 3.5–5.3)
Sodium: 142 mEq/L (ref 135–145)
Total Bilirubin: 0.3 mg/dL (ref 0.3–1.2)
Total Protein: 7.3 g/dL (ref 6.0–8.3)

## 2011-06-03 LAB — PROTIME-INR
INR: 1.6 — ABNORMAL LOW (ref 2.00–3.50)
Protime: 19.2 Seconds — ABNORMAL HIGH (ref 10.6–13.4)

## 2011-06-03 LAB — CANCER ANTIGEN 27.29: CA 27.29: 41 U/mL — ABNORMAL HIGH (ref 0–39)

## 2011-06-04 ENCOUNTER — Emergency Department (HOSPITAL_COMMUNITY)
Admission: EM | Admit: 2011-06-04 | Discharge: 2011-06-04 | Disposition: A | Discharge status: Home / Self Care | Payer: Medicare Other | Attending: Emergency Medicine | Admitting: Emergency Medicine

## 2011-06-04 DIAGNOSIS — Z853 Personal history of malignant neoplasm of breast: Secondary | ICD-10-CM | POA: Insufficient documentation

## 2011-06-04 DIAGNOSIS — Z7901 Long term (current) use of anticoagulants: Secondary | ICD-10-CM | POA: Insufficient documentation

## 2011-06-04 DIAGNOSIS — M79609 Pain in unspecified limb: Secondary | ICD-10-CM | POA: Insufficient documentation

## 2011-06-04 DIAGNOSIS — IMO0002 Reserved for concepts with insufficient information to code with codable children: Secondary | ICD-10-CM | POA: Insufficient documentation

## 2011-06-04 DIAGNOSIS — Z79899 Other long term (current) drug therapy: Secondary | ICD-10-CM | POA: Insufficient documentation

## 2011-06-04 DIAGNOSIS — Z86718 Personal history of other venous thrombosis and embolism: Secondary | ICD-10-CM | POA: Insufficient documentation

## 2011-06-10 ENCOUNTER — Ambulatory Visit: Payer: Medicare Other | Attending: Oncology | Admitting: Physical Therapy

## 2011-06-10 ENCOUNTER — Other Ambulatory Visit: Payer: Self-pay | Admitting: Physician Assistant

## 2011-06-10 ENCOUNTER — Encounter (HOSPITAL_BASED_OUTPATIENT_CLINIC_OR_DEPARTMENT_OTHER): Payer: Medicare Other | Admitting: Oncology

## 2011-06-10 DIAGNOSIS — M25519 Pain in unspecified shoulder: Secondary | ICD-10-CM | POA: Insufficient documentation

## 2011-06-10 DIAGNOSIS — Z7901 Long term (current) use of anticoagulants: Secondary | ICD-10-CM

## 2011-06-10 DIAGNOSIS — Z5112 Encounter for antineoplastic immunotherapy: Secondary | ICD-10-CM

## 2011-06-10 DIAGNOSIS — Z5111 Encounter for antineoplastic chemotherapy: Secondary | ICD-10-CM

## 2011-06-10 DIAGNOSIS — M6281 Muscle weakness (generalized): Secondary | ICD-10-CM | POA: Insufficient documentation

## 2011-06-10 DIAGNOSIS — I82409 Acute embolism and thrombosis of unspecified deep veins of unspecified lower extremity: Secondary | ICD-10-CM

## 2011-06-10 DIAGNOSIS — I890 Lymphedema, not elsewhere classified: Secondary | ICD-10-CM | POA: Insufficient documentation

## 2011-06-10 DIAGNOSIS — C801 Malignant (primary) neoplasm, unspecified: Secondary | ICD-10-CM

## 2011-06-10 DIAGNOSIS — IMO0001 Reserved for inherently not codable concepts without codable children: Secondary | ICD-10-CM | POA: Insufficient documentation

## 2011-06-10 DIAGNOSIS — C50219 Malignant neoplasm of upper-inner quadrant of unspecified female breast: Secondary | ICD-10-CM

## 2011-06-10 LAB — CBC WITH DIFFERENTIAL/PLATELET
BASO%: 0.9 % (ref 0.0–2.0)
Basophils Absolute: 0.1 10*3/uL (ref 0.0–0.1)
EOS%: 4.5 % (ref 0.0–7.0)
Eosinophils Absolute: 0.4 10*3/uL (ref 0.0–0.5)
HCT: 37.9 % (ref 34.8–46.6)
HGB: 12.7 g/dL (ref 11.6–15.9)
LYMPH%: 24.5 % (ref 14.0–49.7)
MCH: 28.8 pg (ref 25.1–34.0)
MCHC: 33.5 g/dL (ref 31.5–36.0)
MCV: 85.9 fL (ref 79.5–101.0)
MONO#: 0.8 10*3/uL (ref 0.1–0.9)
MONO%: 10.4 % (ref 0.0–14.0)
NEUT#: 4.7 10*3/uL (ref 1.5–6.5)
NEUT%: 59.7 % (ref 38.4–76.8)
Platelets: 193 10*3/uL (ref 145–400)
RBC: 4.42 10*6/uL (ref 3.70–5.45)
RDW: 14.1 % (ref 11.2–14.5)
WBC: 7.9 10*3/uL (ref 3.9–10.3)
lymph#: 1.9 10*3/uL (ref 0.9–3.3)

## 2011-06-10 LAB — PROTIME-INR
INR: 2.7 (ref 2.00–3.50)
Protime: 32.4 Seconds — ABNORMAL HIGH (ref 10.6–13.4)

## 2011-06-13 ENCOUNTER — Ambulatory Visit: Payer: Medicare Other | Admitting: Physical Therapy

## 2011-06-15 ENCOUNTER — Ambulatory Visit: Payer: Medicare Other | Admitting: Physical Therapy

## 2011-06-16 ENCOUNTER — Ambulatory Visit: Payer: Medicare Other | Admitting: Physical Therapy

## 2011-06-17 ENCOUNTER — Encounter (HOSPITAL_BASED_OUTPATIENT_CLINIC_OR_DEPARTMENT_OTHER): Payer: Medicare Other | Admitting: Oncology

## 2011-06-17 ENCOUNTER — Other Ambulatory Visit: Payer: Self-pay | Admitting: Physician Assistant

## 2011-06-17 DIAGNOSIS — C50219 Malignant neoplasm of upper-inner quadrant of unspecified female breast: Secondary | ICD-10-CM

## 2011-06-17 DIAGNOSIS — I82629 Acute embolism and thrombosis of deep veins of unspecified upper extremity: Secondary | ICD-10-CM

## 2011-06-17 LAB — CBC WITH DIFFERENTIAL/PLATELET
BASO%: 0.8 % (ref 0.0–2.0)
Basophils Absolute: 0.1 10*3/uL (ref 0.0–0.1)
EOS%: 1.1 % (ref 0.0–7.0)
Eosinophils Absolute: 0.1 10*3/uL (ref 0.0–0.5)
HCT: 39.1 % (ref 34.8–46.6)
HGB: 13 g/dL (ref 11.6–15.9)
LYMPH%: 27.3 % (ref 14.0–49.7)
MCH: 28.3 pg (ref 25.1–34.0)
MCHC: 33.2 g/dL (ref 31.5–36.0)
MCV: 85.2 fL (ref 79.5–101.0)
MONO#: 0.3 10*3/uL (ref 0.1–0.9)
MONO%: 4.7 % (ref 0.0–14.0)
NEUT#: 4.1 10*3/uL (ref 1.5–6.5)
NEUT%: 66.1 % (ref 38.4–76.8)
Platelets: 297 10*3/uL (ref 145–400)
RBC: 4.59 10*6/uL (ref 3.70–5.45)
RDW: 14 % (ref 11.2–14.5)
WBC: 6.2 10*3/uL (ref 3.9–10.3)
lymph#: 1.7 10*3/uL (ref 0.9–3.3)
nRBC: 0 % (ref 0–0)

## 2011-06-17 LAB — PROTIME-INR
INR: 2.7 (ref 2.00–3.50)
Protime: 32.4 Seconds — ABNORMAL HIGH (ref 10.6–13.4)

## 2011-06-20 ENCOUNTER — Ambulatory Visit: Payer: Medicare Other | Admitting: Physical Therapy

## 2011-06-22 ENCOUNTER — Ambulatory Visit: Payer: Medicare Other | Admitting: Physical Therapy

## 2011-06-24 ENCOUNTER — Other Ambulatory Visit: Payer: Self-pay | Admitting: Physician Assistant

## 2011-06-24 ENCOUNTER — Encounter (HOSPITAL_BASED_OUTPATIENT_CLINIC_OR_DEPARTMENT_OTHER): Payer: Medicare Other | Admitting: Oncology

## 2011-06-24 ENCOUNTER — Other Ambulatory Visit: Payer: Self-pay | Admitting: Oncology

## 2011-06-24 DIAGNOSIS — Z5111 Encounter for antineoplastic chemotherapy: Secondary | ICD-10-CM

## 2011-06-24 DIAGNOSIS — Z5112 Encounter for antineoplastic immunotherapy: Secondary | ICD-10-CM

## 2011-06-24 DIAGNOSIS — C50219 Malignant neoplasm of upper-inner quadrant of unspecified female breast: Secondary | ICD-10-CM

## 2011-06-24 DIAGNOSIS — Z7901 Long term (current) use of anticoagulants: Secondary | ICD-10-CM

## 2011-06-24 DIAGNOSIS — C801 Malignant (primary) neoplasm, unspecified: Secondary | ICD-10-CM

## 2011-06-24 LAB — CBC WITH DIFFERENTIAL/PLATELET
BASO%: 1 % (ref 0.0–2.0)
Basophils Absolute: 0.1 10*3/uL (ref 0.0–0.1)
EOS%: 2.5 % (ref 0.0–7.0)
Eosinophils Absolute: 0.2 10*3/uL (ref 0.0–0.5)
HCT: 36.8 % (ref 34.8–46.6)
HGB: 12.3 g/dL (ref 11.6–15.9)
LYMPH%: 35.5 % (ref 14.0–49.7)
MCH: 27.8 pg (ref 25.1–34.0)
MCHC: 33.4 g/dL (ref 31.5–36.0)
MCV: 83.3 fL (ref 79.5–101.0)
MONO#: 0.7 10*3/uL (ref 0.1–0.9)
MONO%: 9.1 % (ref 0.0–14.0)
NEUT#: 4.1 10*3/uL (ref 1.5–6.5)
NEUT%: 51.9 % (ref 38.4–76.8)
Platelets: 389 10*3/uL (ref 145–400)
RBC: 4.42 10*6/uL (ref 3.70–5.45)
RDW: 14.4 % (ref 11.2–14.5)
WBC: 7.9 10*3/uL (ref 3.9–10.3)
lymph#: 2.8 10*3/uL (ref 0.9–3.3)

## 2011-06-24 LAB — COMPREHENSIVE METABOLIC PANEL
ALT: 11 U/L (ref 0–35)
AST: 20 U/L (ref 0–37)
Albumin: 4.1 g/dL (ref 3.5–5.2)
Alkaline Phosphatase: 92 U/L (ref 39–117)
BUN: 14 mg/dL (ref 6–23)
CO2: 30 mEq/L (ref 19–32)
Calcium: 10.1 mg/dL (ref 8.4–10.5)
Chloride: 99 mEq/L (ref 96–112)
Creatinine, Ser: 0.9 mg/dL (ref 0.50–1.10)
Glucose, Bld: 95 mg/dL (ref 70–99)
Potassium: 3.8 mEq/L (ref 3.5–5.3)
Sodium: 139 mEq/L (ref 135–145)
Total Bilirubin: 0.2 mg/dL — ABNORMAL LOW (ref 0.3–1.2)
Total Protein: 8 g/dL (ref 6.0–8.3)

## 2011-06-24 LAB — PROTIME-INR
INR: 2 (ref 2.00–3.50)
Protime: 24 Seconds — ABNORMAL HIGH (ref 10.6–13.4)

## 2011-06-24 LAB — CANCER ANTIGEN 27.29: CA 27.29: 34 U/mL (ref 0–39)

## 2011-06-27 ENCOUNTER — Ambulatory Visit: Payer: Medicare Other | Admitting: Physical Therapy

## 2011-06-29 ENCOUNTER — Ambulatory Visit: Payer: Medicare Other | Admitting: Physical Therapy

## 2011-06-30 ENCOUNTER — Ambulatory Visit: Payer: Medicare Other | Admitting: Physical Therapy

## 2011-07-04 ENCOUNTER — Ambulatory Visit: Payer: Medicare Other | Admitting: Physical Therapy

## 2011-07-06 ENCOUNTER — Ambulatory Visit: Payer: Medicare Other | Admitting: Physical Therapy

## 2011-07-07 ENCOUNTER — Emergency Department (HOSPITAL_COMMUNITY)
Admission: EM | Admit: 2011-07-07 | Discharge: 2011-07-07 | Discharge status: Against Medical Advice | Payer: Medicare Other | Attending: Emergency Medicine | Admitting: Emergency Medicine

## 2011-07-07 DIAGNOSIS — Z0389 Encounter for observation for other suspected diseases and conditions ruled out: Secondary | ICD-10-CM | POA: Insufficient documentation

## 2011-07-08 ENCOUNTER — Ambulatory Visit: Payer: Medicare Other | Admitting: Physical Therapy

## 2011-07-08 ENCOUNTER — Encounter (HOSPITAL_BASED_OUTPATIENT_CLINIC_OR_DEPARTMENT_OTHER): Payer: Medicare Other | Admitting: Oncology

## 2011-07-08 ENCOUNTER — Other Ambulatory Visit: Payer: Self-pay | Admitting: Physician Assistant

## 2011-07-08 DIAGNOSIS — Z7901 Long term (current) use of anticoagulants: Secondary | ICD-10-CM

## 2011-07-08 DIAGNOSIS — C801 Malignant (primary) neoplasm, unspecified: Secondary | ICD-10-CM

## 2011-07-08 DIAGNOSIS — Z5112 Encounter for antineoplastic immunotherapy: Secondary | ICD-10-CM

## 2011-07-08 DIAGNOSIS — C50919 Malignant neoplasm of unspecified site of unspecified female breast: Secondary | ICD-10-CM

## 2011-07-08 DIAGNOSIS — Z5111 Encounter for antineoplastic chemotherapy: Secondary | ICD-10-CM

## 2011-07-08 DIAGNOSIS — I82629 Acute embolism and thrombosis of deep veins of unspecified upper extremity: Secondary | ICD-10-CM

## 2011-07-08 DIAGNOSIS — C50219 Malignant neoplasm of upper-inner quadrant of unspecified female breast: Secondary | ICD-10-CM

## 2011-07-08 LAB — CBC WITH DIFFERENTIAL/PLATELET
BASO%: 0.3 % (ref 0.0–2.0)
Basophils Absolute: 0 10*3/uL (ref 0.0–0.1)
EOS%: 4.1 % (ref 0.0–7.0)
Eosinophils Absolute: 0.4 10*3/uL (ref 0.0–0.5)
HCT: 34.9 % (ref 34.8–46.6)
HGB: 11.4 g/dL — ABNORMAL LOW (ref 11.6–15.9)
LYMPH%: 24.7 % (ref 14.0–49.7)
MCH: 27.8 pg (ref 25.1–34.0)
MCHC: 32.7 g/dL (ref 31.5–36.0)
MCV: 85.1 fL (ref 79.5–101.0)
MONO#: 1 10*3/uL — ABNORMAL HIGH (ref 0.1–0.9)
MONO%: 11.8 % (ref 0.0–14.0)
NEUT#: 5.1 10*3/uL (ref 1.5–6.5)
NEUT%: 59.1 % (ref 38.4–76.8)
Platelets: 289 10*3/uL (ref 145–400)
RBC: 4.1 10*6/uL (ref 3.70–5.45)
RDW: 15.2 % — ABNORMAL HIGH (ref 11.2–14.5)
WBC: 8.7 10*3/uL (ref 3.9–10.3)
lymph#: 2.1 10*3/uL (ref 0.9–3.3)

## 2011-07-08 LAB — COMPREHENSIVE METABOLIC PANEL
ALT: 21 U/L (ref 0–35)
AST: 25 U/L (ref 0–37)
Albumin: 4.1 g/dL (ref 3.5–5.2)
Alkaline Phosphatase: 100 U/L (ref 39–117)
BUN: 9 mg/dL (ref 6–23)
CO2: 29 mEq/L (ref 19–32)
Calcium: 8.7 mg/dL (ref 8.4–10.5)
Chloride: 103 mEq/L (ref 96–112)
Creatinine, Ser: 0.78 mg/dL (ref 0.50–1.10)
Glucose, Bld: 93 mg/dL (ref 70–99)
Potassium: 3.5 mEq/L (ref 3.5–5.3)
Sodium: 140 mEq/L (ref 135–145)
Total Bilirubin: 0.2 mg/dL — ABNORMAL LOW (ref 0.3–1.2)
Total Protein: 6.8 g/dL (ref 6.0–8.3)

## 2011-07-08 LAB — PROTIME-INR
INR: 2.9 (ref 2.00–3.50)
Protime: 34.8 Seconds — ABNORMAL HIGH (ref 10.6–13.4)

## 2011-07-08 LAB — BRAIN NATRIURETIC PEPTIDE: Brain Natriuretic Peptide: 15.9 pg/mL (ref 0.0–100.0)

## 2011-07-13 ENCOUNTER — Ambulatory Visit: Payer: Medicare Other | Attending: Oncology | Admitting: Physical Therapy

## 2011-07-13 DIAGNOSIS — M6281 Muscle weakness (generalized): Secondary | ICD-10-CM | POA: Insufficient documentation

## 2011-07-13 DIAGNOSIS — M25519 Pain in unspecified shoulder: Secondary | ICD-10-CM | POA: Insufficient documentation

## 2011-07-13 DIAGNOSIS — IMO0001 Reserved for inherently not codable concepts without codable children: Secondary | ICD-10-CM | POA: Insufficient documentation

## 2011-07-13 DIAGNOSIS — I890 Lymphedema, not elsewhere classified: Secondary | ICD-10-CM | POA: Insufficient documentation

## 2011-07-14 ENCOUNTER — Ambulatory Visit: Payer: Medicare Other | Admitting: Physical Therapy

## 2011-07-18 ENCOUNTER — Ambulatory Visit: Payer: Medicare Other | Admitting: Physical Therapy

## 2011-07-20 ENCOUNTER — Ambulatory Visit: Payer: Medicare Other | Admitting: Physical Therapy

## 2011-07-21 ENCOUNTER — Ambulatory Visit: Payer: Medicare Other | Admitting: Physical Therapy

## 2011-07-22 ENCOUNTER — Encounter (HOSPITAL_BASED_OUTPATIENT_CLINIC_OR_DEPARTMENT_OTHER): Payer: Medicare Other | Admitting: Oncology

## 2011-07-22 ENCOUNTER — Other Ambulatory Visit: Payer: Self-pay | Admitting: Oncology

## 2011-07-22 ENCOUNTER — Other Ambulatory Visit: Payer: Self-pay | Admitting: Physician Assistant

## 2011-07-22 DIAGNOSIS — C50919 Malignant neoplasm of unspecified site of unspecified female breast: Secondary | ICD-10-CM

## 2011-07-22 DIAGNOSIS — Z7901 Long term (current) use of anticoagulants: Secondary | ICD-10-CM

## 2011-07-22 DIAGNOSIS — C50219 Malignant neoplasm of upper-inner quadrant of unspecified female breast: Secondary | ICD-10-CM

## 2011-07-22 DIAGNOSIS — I82629 Acute embolism and thrombosis of deep veins of unspecified upper extremity: Secondary | ICD-10-CM

## 2011-07-22 DIAGNOSIS — C801 Malignant (primary) neoplasm, unspecified: Secondary | ICD-10-CM

## 2011-07-22 DIAGNOSIS — Z5111 Encounter for antineoplastic chemotherapy: Secondary | ICD-10-CM

## 2011-07-22 LAB — PROTIME-INR
INR: 3.6 — ABNORMAL HIGH (ref 2.00–3.50)
Protime: 43.2 Seconds — ABNORMAL HIGH (ref 10.6–13.4)

## 2011-07-22 LAB — CBC WITH DIFFERENTIAL/PLATELET
BASO%: 0.9 % (ref 0.0–2.0)
Basophils Absolute: 0.1 10*3/uL (ref 0.0–0.1)
EOS%: 4.5 % (ref 0.0–7.0)
Eosinophils Absolute: 0.4 10*3/uL (ref 0.0–0.5)
HCT: 35.7 % (ref 34.8–46.6)
HGB: 11.5 g/dL — ABNORMAL LOW (ref 11.6–15.9)
LYMPH%: 30.6 % (ref 14.0–49.7)
MCH: 27.4 pg (ref 25.1–34.0)
MCHC: 32.2 g/dL (ref 31.5–36.0)
MCV: 85.2 fL (ref 79.5–101.0)
MONO#: 0.9 10*3/uL (ref 0.1–0.9)
MONO%: 12.1 % (ref 0.0–14.0)
NEUT#: 4 10*3/uL (ref 1.5–6.5)
NEUT%: 51.9 % (ref 38.4–76.8)
Platelets: 385 10*3/uL (ref 145–400)
RBC: 4.19 10*6/uL (ref 3.70–5.45)
RDW: 15.4 % — ABNORMAL HIGH (ref 11.2–14.5)
WBC: 7.8 10*3/uL (ref 3.9–10.3)
lymph#: 2.4 10*3/uL (ref 0.9–3.3)
nRBC: 0 % (ref 0–0)

## 2011-07-22 LAB — COMPREHENSIVE METABOLIC PANEL
ALT: 14 U/L (ref 0–35)
AST: 22 U/L (ref 0–37)
Albumin: 3.9 g/dL (ref 3.5–5.2)
Alkaline Phosphatase: 106 U/L (ref 39–117)
BUN: 10 mg/dL (ref 6–23)
CO2: 30 mEq/L (ref 19–32)
Calcium: 9 mg/dL (ref 8.4–10.5)
Chloride: 102 mEq/L (ref 96–112)
Creatinine, Ser: 0.78 mg/dL (ref 0.50–1.10)
Glucose, Bld: 92 mg/dL (ref 70–99)
Potassium: 3.6 mEq/L (ref 3.5–5.3)
Sodium: 140 mEq/L (ref 135–145)
Total Bilirubin: 0.2 mg/dL — ABNORMAL LOW (ref 0.3–1.2)
Total Protein: 7.1 g/dL (ref 6.0–8.3)

## 2011-07-22 LAB — LACTATE DEHYDROGENASE: LDH: 199 U/L (ref 94–250)

## 2011-07-22 LAB — TECHNOLOGIST REVIEW

## 2011-07-22 LAB — CANCER ANTIGEN 27.29: CA 27.29: 38 U/mL (ref 0–39)

## 2011-07-25 ENCOUNTER — Encounter: Payer: Medicare Other | Admitting: Physical Therapy

## 2011-07-27 ENCOUNTER — Ambulatory Visit: Payer: Medicare Other | Admitting: Physical Therapy

## 2011-07-27 ENCOUNTER — Encounter: Payer: Medicare Other | Admitting: Physical Therapy

## 2011-07-28 ENCOUNTER — Encounter: Payer: Medicare Other | Admitting: Physical Therapy

## 2011-08-01 LAB — DIFFERENTIAL
Basophils Absolute: 0.2 — ABNORMAL HIGH
Basophils Relative: 2 — ABNORMAL HIGH
Eosinophils Absolute: 0.2
Eosinophils Relative: 2
Lymphocytes Relative: 14
Lymphs Abs: 1.6
Monocytes Absolute: 0.7
Monocytes Relative: 6
Neutro Abs: 9.3 — ABNORMAL HIGH
Neutrophils Relative %: 77

## 2011-08-01 LAB — CBC
HCT: 34.5 — ABNORMAL LOW
Hemoglobin: 12
MCHC: 34.7
MCV: 87.3
Platelets: 243
RBC: 3.96
RDW: 14.9
WBC: 12 — ABNORMAL HIGH

## 2011-08-01 LAB — COMPREHENSIVE METABOLIC PANEL
ALT: 28
AST: 37
Albumin: 3.3 — ABNORMAL LOW
Alkaline Phosphatase: 115
BUN: 12
CO2: 28
Calcium: 8.8
Chloride: 102
Creatinine, Ser: 0.91
GFR calc Af Amer: >60
GFR calc non Af Amer: >60
Glucose, Bld: 100 — ABNORMAL HIGH
Potassium: 3.8
Sodium: 136
Total Bilirubin: 0.5
Total Protein: 6.6

## 2011-08-02 ENCOUNTER — Encounter (HOSPITAL_COMMUNITY)
Admission: RE | Admit: 2011-08-02 | Discharge: 2011-08-02 | Disposition: A | Discharge status: Home / Self Care | Payer: Medicare Other | Source: Ambulatory Visit | Attending: Oncology | Admitting: Oncology

## 2011-08-02 DIAGNOSIS — I890 Lymphedema, not elsewhere classified: Secondary | ICD-10-CM | POA: Insufficient documentation

## 2011-08-02 DIAGNOSIS — Z9010 Acquired absence of unspecified breast and nipple: Secondary | ICD-10-CM | POA: Insufficient documentation

## 2011-08-02 DIAGNOSIS — C770 Secondary and unspecified malignant neoplasm of lymph nodes of head, face and neck: Secondary | ICD-10-CM | POA: Insufficient documentation

## 2011-08-02 DIAGNOSIS — C50919 Malignant neoplasm of unspecified site of unspecified female breast: Secondary | ICD-10-CM | POA: Insufficient documentation

## 2011-08-02 LAB — GLUCOSE, CAPILLARY: Glucose-Capillary: 122 mg/dL — ABNORMAL HIGH (ref 70–99)

## 2011-08-02 MED ORDER — IOHEXOL 300 MG/ML  SOLN
100.0000 mL | Freq: Once | INTRAMUSCULAR | Status: AC | PRN
  Administered 2011-08-02: 100 mL via INTRAVENOUS

## 2011-08-02 MED ORDER — FLUDEOXYGLUCOSE F - 18 (FDG) INJECTION
19.0000 | Freq: Once | INTRAVENOUS | Status: AC | PRN
  Administered 2011-08-02: 19 via INTRAVENOUS

## 2011-08-05 ENCOUNTER — Other Ambulatory Visit: Payer: Self-pay | Admitting: Physician Assistant

## 2011-08-05 ENCOUNTER — Encounter (HOSPITAL_BASED_OUTPATIENT_CLINIC_OR_DEPARTMENT_OTHER): Payer: Medicare Other | Admitting: Oncology

## 2011-08-05 DIAGNOSIS — Z7901 Long term (current) use of anticoagulants: Secondary | ICD-10-CM

## 2011-08-05 DIAGNOSIS — I82629 Acute embolism and thrombosis of deep veins of unspecified upper extremity: Secondary | ICD-10-CM

## 2011-08-05 DIAGNOSIS — Z5111 Encounter for antineoplastic chemotherapy: Secondary | ICD-10-CM

## 2011-08-05 DIAGNOSIS — R0602 Shortness of breath: Secondary | ICD-10-CM

## 2011-08-05 DIAGNOSIS — C50219 Malignant neoplasm of upper-inner quadrant of unspecified female breast: Secondary | ICD-10-CM

## 2011-08-05 DIAGNOSIS — Z86718 Personal history of other venous thrombosis and embolism: Secondary | ICD-10-CM

## 2011-08-05 LAB — CBC WITH DIFFERENTIAL/PLATELET
BASO%: 0.7 % (ref 0.0–2.0)
Basophils Absolute: 0.1 10*3/uL (ref 0.0–0.1)
EOS%: 2.1 % (ref 0.0–7.0)
Eosinophils Absolute: 0.2 10*3/uL (ref 0.0–0.5)
HCT: 36.8 % (ref 34.8–46.6)
HGB: 12.1 g/dL (ref 11.6–15.9)
LYMPH%: 22.7 % (ref 14.0–49.7)
MCH: 27.5 pg (ref 25.1–34.0)
MCHC: 32.9 g/dL (ref 31.5–36.0)
MCV: 83.6 fL (ref 79.5–101.0)
MONO#: 0.6 10*3/uL (ref 0.1–0.9)
MONO%: 8.5 % (ref 0.0–14.0)
NEUT#: 5 10*3/uL (ref 1.5–6.5)
NEUT%: 66 % (ref 38.4–76.8)
Platelets: 282 10*3/uL (ref 145–400)
RBC: 4.4 10*6/uL (ref 3.70–5.45)
RDW: 15.7 % — ABNORMAL HIGH (ref 11.2–14.5)
WBC: 7.5 10*3/uL (ref 3.9–10.3)
lymph#: 1.7 10*3/uL (ref 0.9–3.3)

## 2011-08-05 LAB — PROTIME-INR
INR: 2 (ref 2.00–3.50)
Protime: 24 Seconds — ABNORMAL HIGH (ref 10.6–13.4)

## 2011-08-07 LAB — COMPREHENSIVE METABOLIC PANEL
ALT: 9 U/L (ref 0–35)
AST: 21 U/L (ref 0–37)
Albumin: 4.1 g/dL (ref 3.5–5.2)
Alkaline Phosphatase: 83 U/L (ref 39–117)
BUN: 11 mg/dL (ref 6–23)
CO2: 28 mEq/L (ref 19–32)
Calcium: 9.4 mg/dL (ref 8.4–10.5)
Chloride: 104 mEq/L (ref 96–112)
Creatinine, Ser: 0.81 mg/dL (ref 0.50–1.10)
Glucose, Bld: 92 mg/dL (ref 70–99)
Potassium: 3.8 mEq/L (ref 3.5–5.3)
Sodium: 139 mEq/L (ref 135–145)
Total Bilirubin: 0.2 mg/dL — ABNORMAL LOW (ref 0.3–1.2)
Total Protein: 7.6 g/dL (ref 6.0–8.3)

## 2011-08-08 LAB — GLUCOSE, CAPILLARY: Glucose-Capillary: 107 — ABNORMAL HIGH

## 2011-08-09 ENCOUNTER — Ambulatory Visit (HOSPITAL_COMMUNITY)
Admission: RE | Admit: 2011-08-09 | Discharge: 2011-08-09 | Disposition: A | Discharge status: Home / Self Care | Payer: Medicare Other | Source: Ambulatory Visit | Attending: Oncology | Admitting: Oncology

## 2011-08-09 DIAGNOSIS — F17200 Nicotine dependence, unspecified, uncomplicated: Secondary | ICD-10-CM | POA: Insufficient documentation

## 2011-08-09 DIAGNOSIS — Z09 Encounter for follow-up examination after completed treatment for conditions other than malignant neoplasm: Secondary | ICD-10-CM | POA: Insufficient documentation

## 2011-08-17 LAB — POCT URINALYSIS DIP (DEVICE)
Glucose, UA: NEGATIVE
Nitrite: NEGATIVE
Operator id: 235561
Protein, ur: NEGATIVE
Specific Gravity, Urine: 1.02
Urobilinogen, UA: 1
pH: 5.5

## 2011-08-17 LAB — I-STAT 8, (EC8 V) (CONVERTED LAB)
Acid-base deficit: 1
BUN: 10
Bicarbonate: 24.9 — ABNORMAL HIGH
Chloride: 103
Glucose, Bld: 96
HCT: 42
Hemoglobin: 14.3
Operator id: 235561
Potassium: 3.4 — ABNORMAL LOW
Sodium: 137
TCO2: 26
pCO2, Ven: 44.2 — ABNORMAL LOW
pH, Ven: 7.358 — ABNORMAL HIGH

## 2011-08-17 LAB — WET PREP, GENITAL
Clue Cells Wet Prep HPF POC: NONE SEEN
Trich, Wet Prep: NONE SEEN
Yeast Wet Prep HPF POC: NONE SEEN

## 2011-08-17 LAB — URINE CULTURE: Colony Count: 4000

## 2011-08-17 LAB — GC/CHLAMYDIA PROBE AMP, GENITAL
Chlamydia, DNA Probe: NEGATIVE
GC Probe Amp, Genital: NEGATIVE

## 2011-08-17 LAB — POCT I-STAT CREATININE
Creatinine, Ser: 1
Operator id: 235561

## 2011-08-18 ENCOUNTER — Encounter (HOSPITAL_BASED_OUTPATIENT_CLINIC_OR_DEPARTMENT_OTHER): Payer: Medicare Other | Admitting: Oncology

## 2011-08-18 DIAGNOSIS — Z7901 Long term (current) use of anticoagulants: Secondary | ICD-10-CM

## 2011-08-18 DIAGNOSIS — C50919 Malignant neoplasm of unspecified site of unspecified female breast: Secondary | ICD-10-CM

## 2011-08-18 DIAGNOSIS — I82629 Acute embolism and thrombosis of deep veins of unspecified upper extremity: Secondary | ICD-10-CM

## 2011-08-18 DIAGNOSIS — I890 Lymphedema, not elsewhere classified: Secondary | ICD-10-CM

## 2011-08-19 ENCOUNTER — Other Ambulatory Visit: Payer: Self-pay | Admitting: Physician Assistant

## 2011-08-19 ENCOUNTER — Encounter: Payer: Medicare Other | Admitting: Oncology

## 2011-08-19 ENCOUNTER — Other Ambulatory Visit: Payer: Self-pay | Admitting: Oncology

## 2011-08-19 DIAGNOSIS — Z5111 Encounter for antineoplastic chemotherapy: Secondary | ICD-10-CM

## 2011-08-19 DIAGNOSIS — C50219 Malignant neoplasm of upper-inner quadrant of unspecified female breast: Secondary | ICD-10-CM

## 2011-08-19 DIAGNOSIS — Z86718 Personal history of other venous thrombosis and embolism: Secondary | ICD-10-CM

## 2011-08-19 DIAGNOSIS — R0602 Shortness of breath: Secondary | ICD-10-CM

## 2011-08-19 DIAGNOSIS — Z5112 Encounter for antineoplastic immunotherapy: Secondary | ICD-10-CM

## 2011-08-19 DIAGNOSIS — I82629 Acute embolism and thrombosis of deep veins of unspecified upper extremity: Secondary | ICD-10-CM

## 2011-08-19 LAB — CBC WITH DIFFERENTIAL/PLATELET
BASO%: 0.6 % (ref 0.0–2.0)
Basophils Absolute: 0 10*3/uL (ref 0.0–0.1)
EOS%: 3.9 % (ref 0.0–7.0)
Eosinophils Absolute: 0.3 10*3/uL (ref 0.0–0.5)
HCT: 34.1 % — ABNORMAL LOW (ref 34.8–46.6)
HGB: 10.9 g/dL — ABNORMAL LOW (ref 11.6–15.9)
LYMPH%: 24.6 % (ref 14.0–49.7)
MCH: 27.1 pg (ref 25.1–34.0)
MCHC: 32 g/dL (ref 31.5–36.0)
MCV: 84.8 fL (ref 79.5–101.0)
MONO#: 1.1 10*3/uL — ABNORMAL HIGH (ref 0.1–0.9)
MONO%: 15.9 % — ABNORMAL HIGH (ref 0.0–14.0)
NEUT#: 3.8 10*3/uL (ref 1.5–6.5)
NEUT%: 55 % (ref 38.4–76.8)
Platelets: 290 10*3/uL (ref 145–400)
RBC: 4.02 10*6/uL (ref 3.70–5.45)
RDW: 16.3 % — ABNORMAL HIGH (ref 11.2–14.5)
WBC: 6.9 10*3/uL (ref 3.9–10.3)
lymph#: 1.7 10*3/uL (ref 0.9–3.3)

## 2011-08-19 LAB — CANCER ANTIGEN 27.29: CA 27.29: 36 U/mL (ref 0–39)

## 2011-08-19 LAB — COMPREHENSIVE METABOLIC PANEL
ALT: 29 U/L (ref 0–35)
AST: 36 U/L (ref 0–37)
Albumin: 3.8 g/dL (ref 3.5–5.2)
Alkaline Phosphatase: 123 U/L — ABNORMAL HIGH (ref 39–117)
BUN: 7 mg/dL (ref 6–23)
CO2: 30 mEq/L (ref 19–32)
Calcium: 8.4 mg/dL (ref 8.4–10.5)
Chloride: 99 mEq/L (ref 96–112)
Creatinine, Ser: 0.73 mg/dL (ref 0.50–1.10)
Glucose, Bld: 87 mg/dL (ref 70–99)
Potassium: 3.4 mEq/L — ABNORMAL LOW (ref 3.5–5.3)
Sodium: 141 mEq/L (ref 135–145)
Total Bilirubin: 0.2 mg/dL — ABNORMAL LOW (ref 0.3–1.2)
Total Protein: 6.8 g/dL (ref 6.0–8.3)

## 2011-08-19 LAB — PROTHROMBIN TIME
INR: 6.81 (ref <1.50)
Prothrombin Time: 60 seconds — ABNORMAL HIGH (ref 11.6–15.2)

## 2011-08-19 LAB — PROTIME-INR

## 2011-08-19 LAB — LACTATE DEHYDROGENASE: LDH: 216 U/L (ref 94–250)

## 2011-08-23 LAB — URINE MICROSCOPIC-ADD ON

## 2011-08-23 LAB — URINALYSIS, ROUTINE W REFLEX MICROSCOPIC
Bilirubin Urine: NEGATIVE
Glucose, UA: NEGATIVE
Ketones, ur: NEGATIVE
Leukocytes, UA: NEGATIVE
Nitrite: NEGATIVE
Protein, ur: NEGATIVE
Specific Gravity, Urine: 1.027
Urobilinogen, UA: 0.2
pH: 6

## 2011-08-23 LAB — WET PREP, GENITAL
Clue Cells Wet Prep HPF POC: NONE SEEN
Trich, Wet Prep: NONE SEEN
WBC, Wet Prep HPF POC: NONE SEEN
Yeast Wet Prep HPF POC: NONE SEEN

## 2011-08-23 LAB — GC/CHLAMYDIA PROBE AMP, GENITAL
Chlamydia, DNA Probe: NEGATIVE
GC Probe Amp, Genital: NEGATIVE

## 2011-08-24 ENCOUNTER — Other Ambulatory Visit: Payer: Self-pay | Admitting: Oncology

## 2011-08-24 ENCOUNTER — Encounter (HOSPITAL_BASED_OUTPATIENT_CLINIC_OR_DEPARTMENT_OTHER): Payer: Medicare Other | Admitting: Oncology

## 2011-08-24 DIAGNOSIS — C50219 Malignant neoplasm of upper-inner quadrant of unspecified female breast: Secondary | ICD-10-CM

## 2011-08-24 DIAGNOSIS — Z86718 Personal history of other venous thrombosis and embolism: Secondary | ICD-10-CM

## 2011-08-24 DIAGNOSIS — I82629 Acute embolism and thrombosis of deep veins of unspecified upper extremity: Secondary | ICD-10-CM

## 2011-08-24 DIAGNOSIS — Z7901 Long term (current) use of anticoagulants: Secondary | ICD-10-CM

## 2011-08-24 LAB — POCT URINALYSIS DIP (DEVICE)
Bilirubin Urine: NEGATIVE
Glucose, UA: NEGATIVE
Ketones, ur: NEGATIVE
Nitrite: NEGATIVE
Operator id: 239701
Protein, ur: NEGATIVE
Specific Gravity, Urine: >=1.03
Urobilinogen, UA: 0.2
pH: 5.5

## 2011-08-24 LAB — PROTIME-INR
INR: 1.1 — ABNORMAL LOW (ref 2.00–3.50)
Protime: 13.2 Seconds (ref 10.6–13.4)

## 2011-09-02 ENCOUNTER — Other Ambulatory Visit: Payer: Self-pay | Admitting: Physician Assistant

## 2011-09-02 ENCOUNTER — Encounter (HOSPITAL_BASED_OUTPATIENT_CLINIC_OR_DEPARTMENT_OTHER): Payer: Medicare Other | Admitting: Oncology

## 2011-09-02 DIAGNOSIS — R0602 Shortness of breath: Secondary | ICD-10-CM

## 2011-09-02 DIAGNOSIS — Z5112 Encounter for antineoplastic immunotherapy: Secondary | ICD-10-CM

## 2011-09-02 DIAGNOSIS — Z86718 Personal history of other venous thrombosis and embolism: Secondary | ICD-10-CM

## 2011-09-02 DIAGNOSIS — C50219 Malignant neoplasm of upper-inner quadrant of unspecified female breast: Secondary | ICD-10-CM

## 2011-09-02 DIAGNOSIS — I82629 Acute embolism and thrombosis of deep veins of unspecified upper extremity: Secondary | ICD-10-CM

## 2011-09-02 DIAGNOSIS — Z5111 Encounter for antineoplastic chemotherapy: Secondary | ICD-10-CM

## 2011-09-02 LAB — COMPREHENSIVE METABOLIC PANEL
ALT: 11 U/L (ref 0–35)
AST: 23 U/L (ref 0–37)
Albumin: 4.3 g/dL (ref 3.5–5.2)
Alkaline Phosphatase: 107 U/L (ref 39–117)
BUN: 12 mg/dL (ref 6–23)
CO2: 28 mEq/L (ref 19–32)
Calcium: 9 mg/dL (ref 8.4–10.5)
Chloride: 101 mEq/L (ref 96–112)
Creatinine, Ser: 0.82 mg/dL (ref 0.50–1.10)
Glucose, Bld: 89 mg/dL (ref 70–99)
Potassium: 3.7 mEq/L (ref 3.5–5.3)
Sodium: 138 mEq/L (ref 135–145)
Total Bilirubin: 0.2 mg/dL — ABNORMAL LOW (ref 0.3–1.2)
Total Protein: 7.1 g/dL (ref 6.0–8.3)

## 2011-09-02 LAB — CBC WITH DIFFERENTIAL/PLATELET
BASO%: 0.6 % (ref 0.0–2.0)
Basophils Absolute: 0.1 10*3/uL (ref 0.0–0.1)
EOS%: 2.8 % (ref 0.0–7.0)
Eosinophils Absolute: 0.2 10*3/uL (ref 0.0–0.5)
HCT: 37.4 % (ref 34.8–46.6)
HGB: 11.9 g/dL (ref 11.6–15.9)
LYMPH%: 21.6 % (ref 14.0–49.7)
MCH: 26.7 pg (ref 25.1–34.0)
MCHC: 31.8 g/dL (ref 31.5–36.0)
MCV: 84 fL (ref 79.5–101.0)
MONO#: 0.8 10*3/uL (ref 0.1–0.9)
MONO%: 9.5 % (ref 0.0–14.0)
NEUT#: 5.2 10*3/uL (ref 1.5–6.5)
NEUT%: 65.5 % (ref 38.4–76.8)
Platelets: 375 10*3/uL (ref 145–400)
RBC: 4.45 10*6/uL (ref 3.70–5.45)
RDW: 16.4 % — ABNORMAL HIGH (ref 11.2–14.5)
WBC: 7.9 10*3/uL (ref 3.9–10.3)
lymph#: 1.7 10*3/uL (ref 0.9–3.3)
nRBC: 0 % (ref 0–0)

## 2011-09-02 LAB — PROTIME-INR
INR: 1.6 — ABNORMAL LOW (ref 2.00–3.50)
Protime: 19.2 Seconds — ABNORMAL HIGH (ref 10.6–13.4)

## 2011-09-08 IMAGING — CT CT HEAD WO/W CM
3 of 6 series · 16 of 47 positions shown, 19 images · IV contrast (agent unspecified)
Comparison: Brain MRI 02/26/2008.

CLINICAL DATA: 54-year-old female with breast cancer.

CT HEAD WITHOUT AND WITH CONTRAST
TECHNIQUE: Contiguous axial images were obtained from the base of
the skull through the vertex without and with intravenous contrast.
Contrast: 100 ml Hmnipaque-V44 (in conjunction with chest abdomen
and pelvis CT reported separately).

[Series 5: cap with st · axial · 0.73mm/px · z∈[+480,+970]mm · 10 of 113 slices shown, 13 images]
[im 8/113  brain]
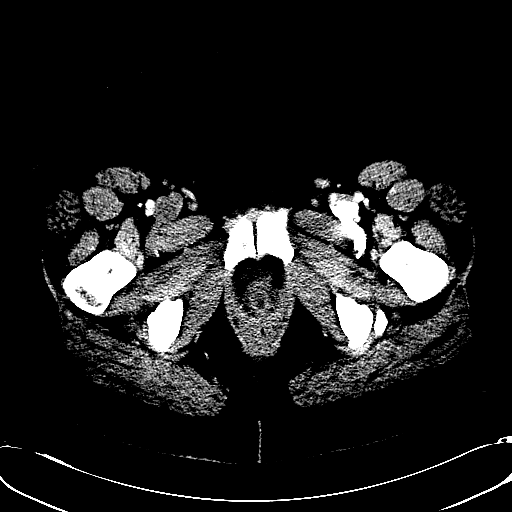
[im 8/113  bone]
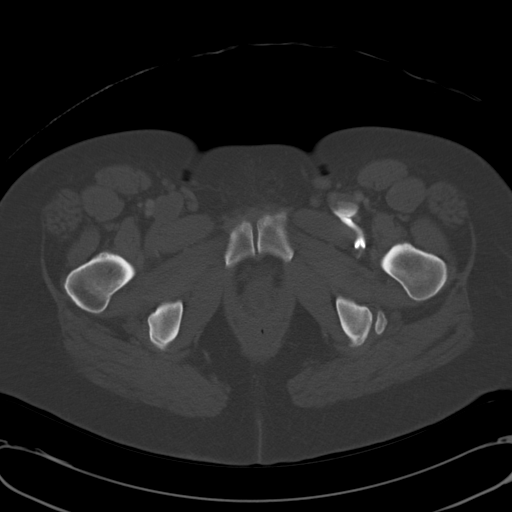
[im 22/113  brain]
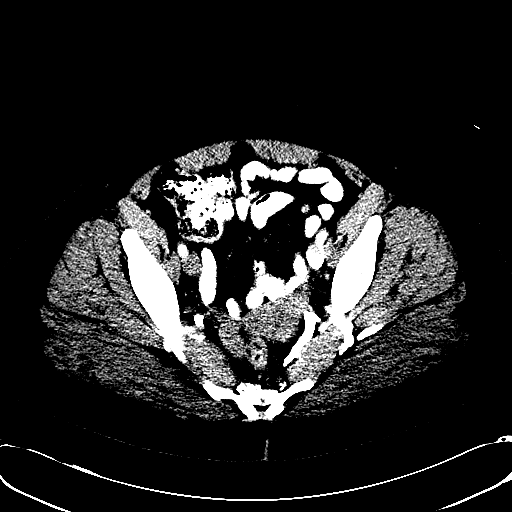
[im 29/113  brain]
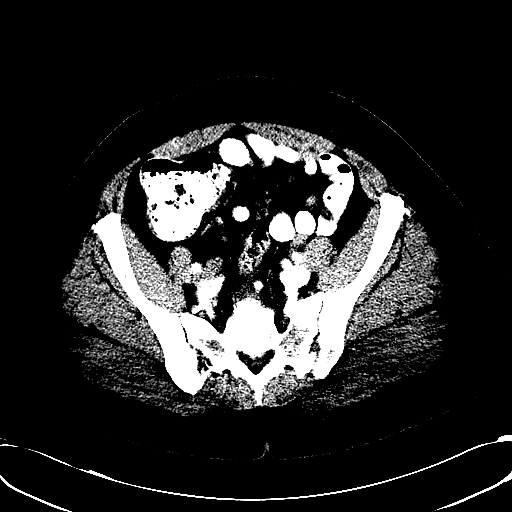
[im 43/113  brain]
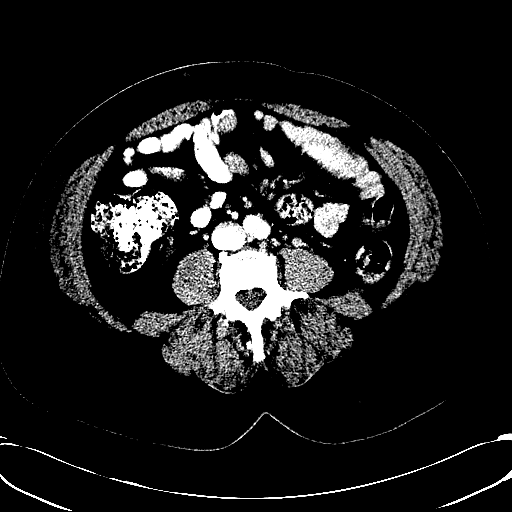
[im 50/113  brain]
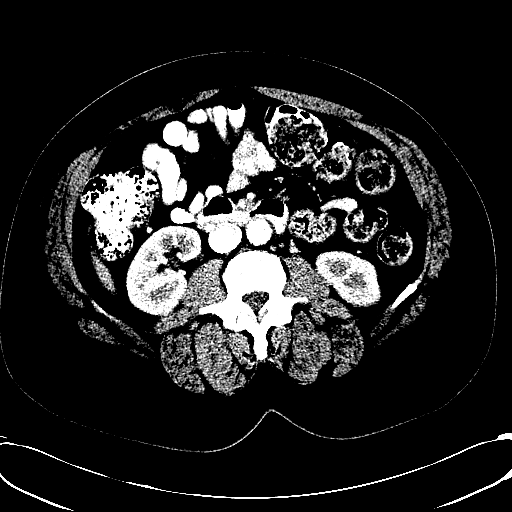
[im 50/113  bone]
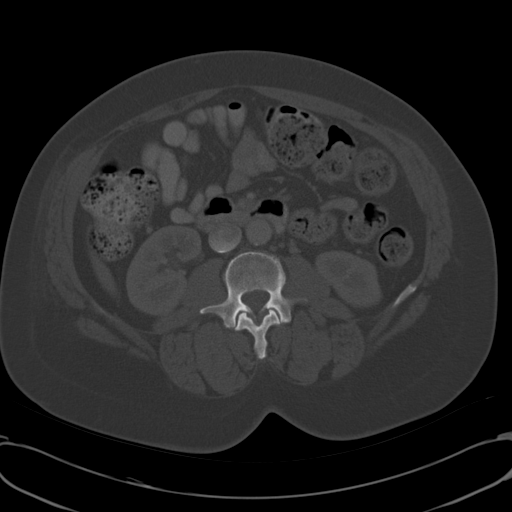
[im 64/113  brain]
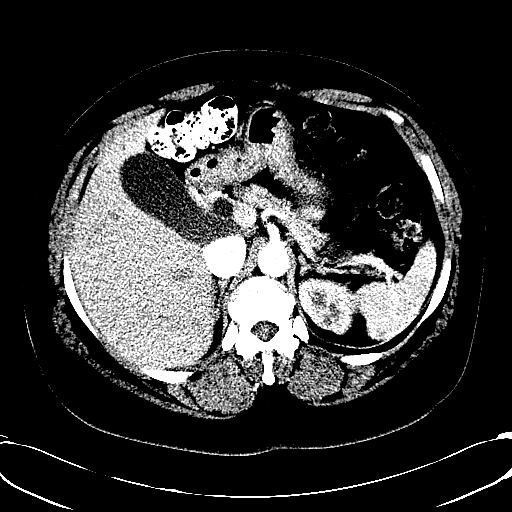
[im 71/113  brain]
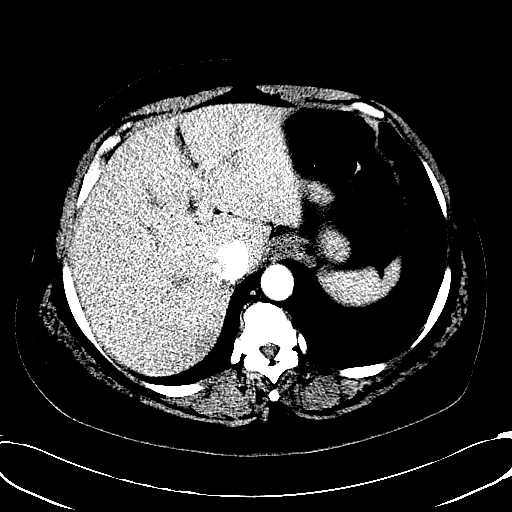
[im 85/113  brain]
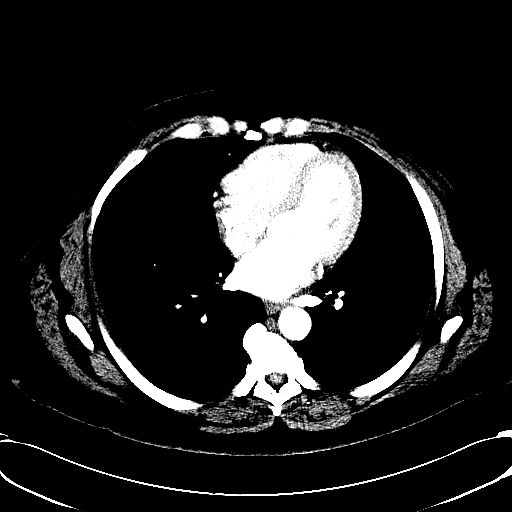
[im 92/113  brain]
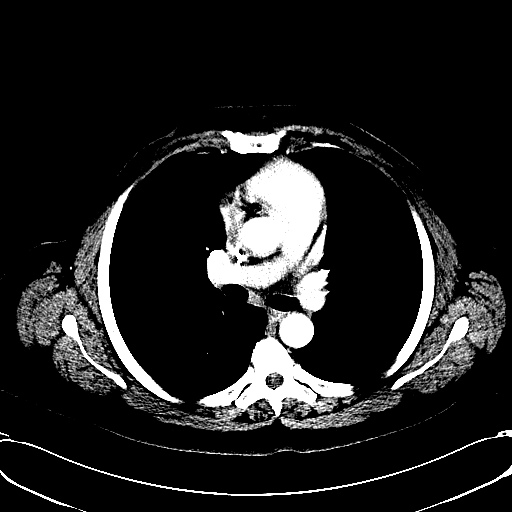
[im 92/113  bone]
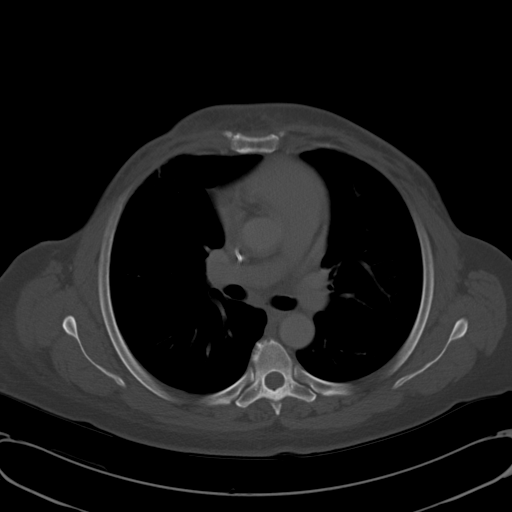
[im 106/113  brain]
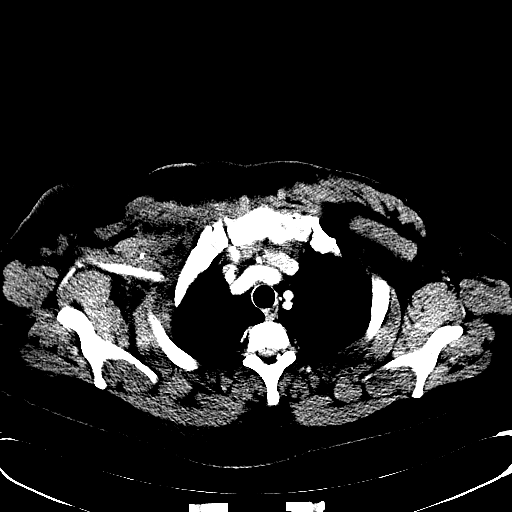

[Series 602: <mpr thick range> · coronal · 1.10mm/px · 3 of 95 slices shown]
[im 32/95  brain]
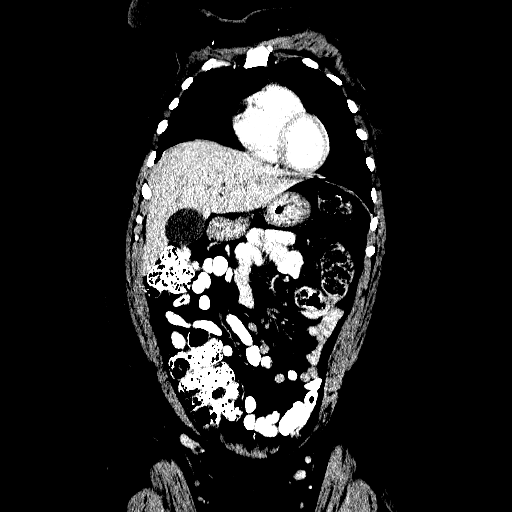
[im 42/95  brain]
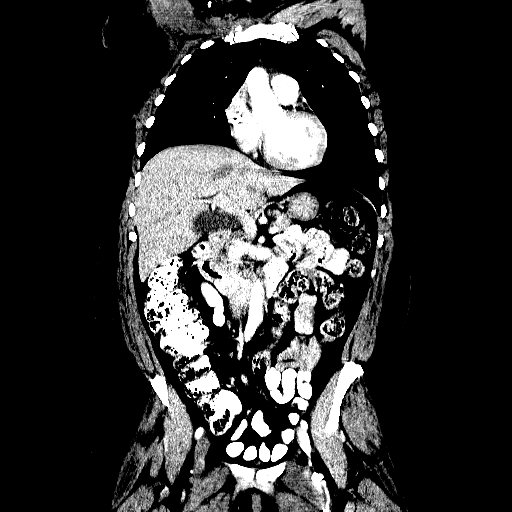
[im 53/95  brain]
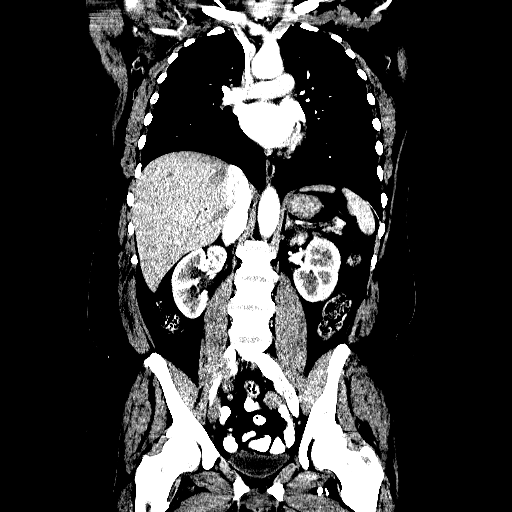

[Series 603: <mpr thick range(1)> · sagittal · 1.10mm/px · 3 of 112 slices shown]
[im 38/112  brain]
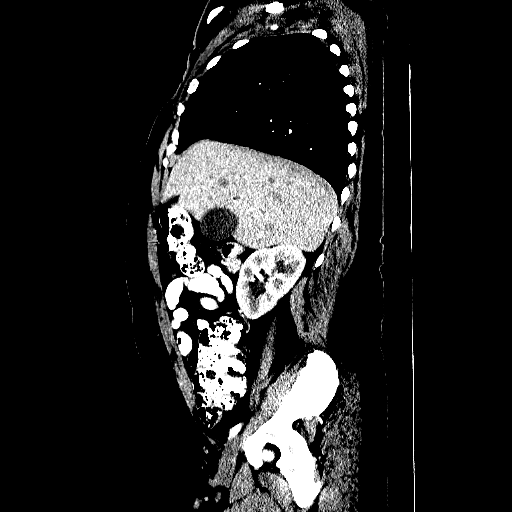
[im 56/112  brain]
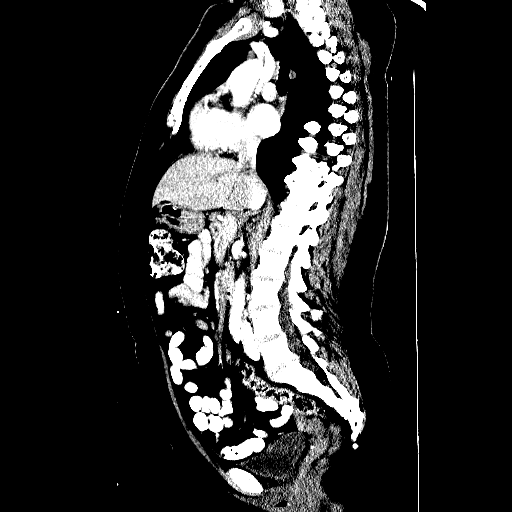
[im 75/112  brain]
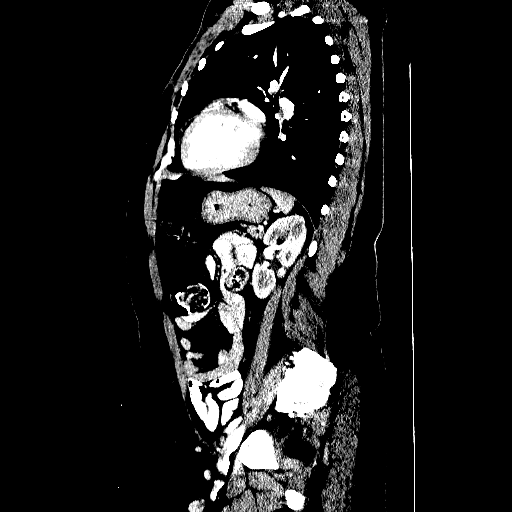

[16 of 47 positions shown; findings below may reference images not displayed]

FINDINGS: Visualized orbits and scalp soft tissues are within
normal limits.  Visualized paranasal sinuses and mastoids are
clear.  No acute osseous abnormality identified.

Cerebral volume is within normal limits for age.  No midline shift,
ventriculomegaly, mass effect, evidence of mass lesion,
intracranial hemorrhage or evidence of cortically based acute
infarction.  Gray-white matter differentiation is within normal
limits throughout the brain.  Coarse dural calcifications.
Partially empty sella unchanged. No abnormal enhancement
identified.  Major intracranial vascular structures appear normally
enhancing.
IMPRESSION: 1. No acute or metastatic intracranial abnormality.
2.  Chest abdomen and pelvis reported separately.

## 2011-09-09 ENCOUNTER — Encounter (HOSPITAL_BASED_OUTPATIENT_CLINIC_OR_DEPARTMENT_OTHER): Payer: Medicare Other | Admitting: Oncology

## 2011-09-09 ENCOUNTER — Other Ambulatory Visit: Payer: Self-pay | Admitting: Oncology

## 2011-09-09 DIAGNOSIS — R0602 Shortness of breath: Secondary | ICD-10-CM

## 2011-09-09 DIAGNOSIS — Z7901 Long term (current) use of anticoagulants: Secondary | ICD-10-CM

## 2011-09-09 DIAGNOSIS — I82629 Acute embolism and thrombosis of deep veins of unspecified upper extremity: Secondary | ICD-10-CM

## 2011-09-09 DIAGNOSIS — Z86718 Personal history of other venous thrombosis and embolism: Secondary | ICD-10-CM

## 2011-09-09 LAB — PROTIME-INR
INR: 3 (ref 2.00–3.50)
Protime: 36 Seconds — ABNORMAL HIGH (ref 10.6–13.4)

## 2011-09-09 LAB — CBC WITH DIFFERENTIAL/PLATELET
BASO%: 1 % (ref 0.0–2.0)
Basophils Absolute: 0.1 10*3/uL (ref 0.0–0.1)
EOS%: 2 % (ref 0.0–7.0)
Eosinophils Absolute: 0.1 10*3/uL (ref 0.0–0.5)
HCT: 37.5 % (ref 34.8–46.6)
HGB: 12.1 g/dL (ref 11.6–15.9)
LYMPH%: 33.6 % (ref 14.0–49.7)
MCH: 26.8 pg (ref 25.1–34.0)
MCHC: 32.3 g/dL (ref 31.5–36.0)
MCV: 83.1 fL (ref 79.5–101.0)
MONO#: 0.3 10*3/uL (ref 0.1–0.9)
MONO%: 4.9 % (ref 0.0–14.0)
NEUT#: 3.5 10*3/uL (ref 1.5–6.5)
NEUT%: 58.5 % (ref 38.4–76.8)
Platelets: 360 10*3/uL (ref 145–400)
RBC: 4.51 10*6/uL (ref 3.70–5.45)
RDW: 16.4 % — ABNORMAL HIGH (ref 11.2–14.5)
WBC: 6 10*3/uL (ref 3.9–10.3)
lymph#: 2 10*3/uL (ref 0.9–3.3)
nRBC: 0 % (ref 0–0)

## 2011-09-14 ENCOUNTER — Telehealth: Payer: Self-pay | Admitting: *Deleted

## 2011-09-14 NOTE — Telephone Encounter (Signed)
Kim with [employer] called.  Needs clarification of script received fom pt. Dated 09-02-11 for morphine.  Report patient has received LOTS of opana and percocet 10mg  from Dr. Ronne Binning.  Kim asked for return call from MD.

## 2011-09-16 ENCOUNTER — Other Ambulatory Visit: Payer: Self-pay | Admitting: Physician Assistant

## 2011-09-16 ENCOUNTER — Ambulatory Visit: Payer: Medicare Other | Admitting: Oncology

## 2011-09-16 ENCOUNTER — Other Ambulatory Visit: Payer: Self-pay

## 2011-09-16 ENCOUNTER — Ambulatory Visit (HOSPITAL_BASED_OUTPATIENT_CLINIC_OR_DEPARTMENT_OTHER): Payer: Medicare Other

## 2011-09-16 ENCOUNTER — Other Ambulatory Visit (HOSPITAL_BASED_OUTPATIENT_CLINIC_OR_DEPARTMENT_OTHER): Payer: Medicare Other | Admitting: Lab

## 2011-09-16 ENCOUNTER — Telehealth: Payer: Self-pay | Admitting: *Deleted

## 2011-09-16 VITALS — BP 119/82 | HR 94 | Temp 98.5°F | Ht 64.5 in | Wt 208.6 lb

## 2011-09-16 DIAGNOSIS — C50919 Malignant neoplasm of unspecified site of unspecified female breast: Secondary | ICD-10-CM

## 2011-09-16 DIAGNOSIS — Z5112 Encounter for antineoplastic immunotherapy: Secondary | ICD-10-CM

## 2011-09-16 DIAGNOSIS — C50219 Malignant neoplasm of upper-inner quadrant of unspecified female breast: Secondary | ICD-10-CM

## 2011-09-16 DIAGNOSIS — I82629 Acute embolism and thrombosis of deep veins of unspecified upper extremity: Secondary | ICD-10-CM

## 2011-09-16 DIAGNOSIS — Z7901 Long term (current) use of anticoagulants: Secondary | ICD-10-CM

## 2011-09-16 DIAGNOSIS — Z86718 Personal history of other venous thrombosis and embolism: Secondary | ICD-10-CM

## 2011-09-16 LAB — CBC WITH DIFFERENTIAL/PLATELET
BASO%: 0.9 % (ref 0.0–2.0)
Basophils Absolute: 0.1 10*3/uL (ref 0.0–0.1)
EOS%: 5.2 % (ref 0.0–7.0)
Eosinophils Absolute: 0.4 10*3/uL (ref 0.0–0.5)
HCT: 36 % (ref 34.8–46.6)
HGB: 11.4 g/dL — ABNORMAL LOW (ref 11.6–15.9)
LYMPH%: 31 % (ref 14.0–49.7)
MCH: 26.6 pg (ref 25.1–34.0)
MCHC: 31.7 g/dL (ref 31.5–36.0)
MCV: 83.9 fL (ref 79.5–101.0)
MONO#: 0.7 10*3/uL (ref 0.1–0.9)
MONO%: 9.8 % (ref 0.0–14.0)
NEUT#: 3.6 10*3/uL (ref 1.5–6.5)
NEUT%: 53.1 % (ref 38.4–76.8)
Platelets: 306 10*3/uL (ref 145–400)
RBC: 4.29 10*6/uL (ref 3.70–5.45)
RDW: 17.1 % — ABNORMAL HIGH (ref 11.2–14.5)
WBC: 6.7 10*3/uL (ref 3.9–10.3)
lymph#: 2.1 10*3/uL (ref 0.9–3.3)
nRBC: 0 % (ref 0–0)

## 2011-09-16 LAB — PROTIME-INR
INR: 3.3 (ref 2.00–3.50)
Protime: 39.6 Seconds — ABNORMAL HIGH (ref 10.6–13.4)

## 2011-09-16 LAB — COMPREHENSIVE METABOLIC PANEL
ALT: 16 U/L (ref 0–35)
AST: 28 U/L (ref 0–37)
Albumin: 4.1 g/dL (ref 3.5–5.2)
Alkaline Phosphatase: 106 U/L (ref 39–117)
BUN: 10 mg/dL (ref 6–23)
CO2: 30 mEq/L (ref 19–32)
Calcium: 8.9 mg/dL (ref 8.4–10.5)
Chloride: 102 mEq/L (ref 96–112)
Creatinine, Ser: 0.89 mg/dL (ref 0.50–1.10)
Glucose, Bld: 94 mg/dL (ref 70–99)
Potassium: 3.5 mEq/L (ref 3.5–5.3)
Sodium: 141 mEq/L (ref 135–145)
Total Bilirubin: 0.2 mg/dL — ABNORMAL LOW (ref 0.3–1.2)
Total Protein: 7.1 g/dL (ref 6.0–8.3)

## 2011-09-16 MED ORDER — ACETAMINOPHEN 325 MG PO TABS
650.0000 mg | ORAL_TABLET | Freq: Once | ORAL | Status: AC
  Administered 2011-09-16: 650 mg via ORAL

## 2011-09-16 MED ORDER — DIPHENHYDRAMINE HCL 25 MG PO CAPS
50.0000 mg | ORAL_CAPSULE | Freq: Once | ORAL | Status: AC
  Administered 2011-09-16: 50 mg via ORAL

## 2011-09-16 MED ORDER — TRASTUZUMAB CHEMO INJECTION 440 MG
4.0000 mg/kg | Freq: Once | INTRAVENOUS | Status: AC
  Administered 2011-09-16: 378 mg via INTRAVENOUS
  Filled 2011-09-16: qty 18

## 2011-09-16 MED ORDER — PROCHLORPERAZINE MALEATE 10 MG PO TABS
10.0000 mg | ORAL_TABLET | Freq: Once | ORAL | Status: AC
  Administered 2011-09-16: 10 mg via ORAL

## 2011-09-16 MED ORDER — HEPARIN SOD (PORK) LOCK FLUSH 100 UNIT/ML IV SOLN
500.0000 [IU] | Freq: Once | INTRAVENOUS | Status: AC | PRN
  Administered 2011-09-16: 500 [IU]
  Filled 2011-09-16: qty 5

## 2011-09-16 MED ORDER — SODIUM CHLORIDE 0.9 % IV SOLN
Freq: Once | INTRAVENOUS | Status: AC
  Administered 2011-09-16: 12:00:00 via INTRAVENOUS

## 2011-09-16 MED ORDER — VINORELBINE TARTRATE CHEMO INJECTION 50 MG/5ML
25.0000 mg/m2 | Freq: Once | INTRAVENOUS | Status: AC
  Administered 2011-09-16: 52 mg via INTRAVENOUS
  Filled 2011-09-16: qty 5.2

## 2011-09-16 MED ORDER — SODIUM CHLORIDE 0.9 % IJ SOLN
10.0000 mL | INTRAMUSCULAR | Status: DC | PRN
  Administered 2011-09-16: 10 mL
  Filled 2011-09-16: qty 10

## 2011-09-16 NOTE — Telephone Encounter (Signed)
PER DR.RUBIN NURSE ADD PATIENT TO DR.RUBIN'S SCHEDULE BEFORE THE PATIENT GOES TO THE INFUSION ROOM ON 09-16-2011 PATIENT PLACED ON SCHEDULE DOUBLE BOOKED FOR 9:00AM PER PERMISSION OF THE DR.RUBIN

## 2011-09-18 ENCOUNTER — Other Ambulatory Visit: Payer: Self-pay | Admitting: Oncology

## 2011-09-23 ENCOUNTER — Other Ambulatory Visit: Payer: Self-pay

## 2011-09-23 ENCOUNTER — Telehealth: Payer: Self-pay | Admitting: *Deleted

## 2011-09-23 ENCOUNTER — Other Ambulatory Visit (HOSPITAL_BASED_OUTPATIENT_CLINIC_OR_DEPARTMENT_OTHER): Payer: Medicare Other

## 2011-09-23 DIAGNOSIS — C50219 Malignant neoplasm of upper-inner quadrant of unspecified female breast: Secondary | ICD-10-CM

## 2011-09-23 DIAGNOSIS — I82629 Acute embolism and thrombosis of deep veins of unspecified upper extremity: Secondary | ICD-10-CM

## 2011-09-23 LAB — CBC WITH DIFFERENTIAL/PLATELET
BASO%: 0 % (ref 0.0–2.0)
Basophils Absolute: 0 10*3/uL (ref 0.0–0.1)
EOS%: 3.8 % (ref 0.0–7.0)
Eosinophils Absolute: 0.3 10*3/uL (ref 0.0–0.5)
HCT: 33.1 % — ABNORMAL LOW (ref 34.8–46.6)
HGB: 11 g/dL — ABNORMAL LOW (ref 11.6–15.9)
LYMPH%: 61.9 % — ABNORMAL HIGH (ref 14.0–49.7)
MCH: 27.7 pg (ref 25.1–34.0)
MCHC: 33.2 g/dL (ref 31.5–36.0)
MCV: 83.6 fL (ref 79.5–101.0)
MONO#: 0.3 10*3/uL (ref 0.1–0.9)
MONO%: 4.4 % (ref 0.0–14.0)
NEUT#: 2.1 10*3/uL (ref 1.5–6.5)
NEUT%: 29.9 % — ABNORMAL LOW (ref 38.4–76.8)
Platelets: 364 10*3/uL (ref 145–400)
RBC: 3.96 10*6/uL (ref 3.70–5.45)
RDW: 17.4 % — ABNORMAL HIGH (ref 11.2–14.5)
WBC: 6.9 10*3/uL (ref 3.9–10.3)
lymph#: 4.3 10*3/uL — ABNORMAL HIGH (ref 0.9–3.3)

## 2011-09-23 LAB — PROTIME-INR
INR: 4 — ABNORMAL HIGH (ref 2.00–3.50)
Protime: 48 Seconds — ABNORMAL HIGH (ref 10.6–13.4)

## 2011-09-23 LAB — TECHNOLOGIST REVIEW

## 2011-09-23 NOTE — Telephone Encounter (Signed)
Advised patient to hold Coumadin until Sunday and then resume at 5mg /daily per Debbora Presto pa  Pt verbalized understanding.

## 2011-09-30 ENCOUNTER — Ambulatory Visit (HOSPITAL_BASED_OUTPATIENT_CLINIC_OR_DEPARTMENT_OTHER): Payer: Medicare Other | Admitting: Oncology

## 2011-09-30 ENCOUNTER — Telehealth: Payer: Self-pay | Admitting: *Deleted

## 2011-09-30 ENCOUNTER — Other Ambulatory Visit: Payer: Self-pay

## 2011-09-30 ENCOUNTER — Other Ambulatory Visit (HOSPITAL_BASED_OUTPATIENT_CLINIC_OR_DEPARTMENT_OTHER): Payer: Medicare Other | Admitting: Lab

## 2011-09-30 ENCOUNTER — Ambulatory Visit (HOSPITAL_BASED_OUTPATIENT_CLINIC_OR_DEPARTMENT_OTHER): Payer: Medicare Other

## 2011-09-30 VITALS — BP 131/80 | HR 85 | Temp 98.1°F | Ht 64.5 in | Wt 207.5 lb

## 2011-09-30 DIAGNOSIS — C50219 Malignant neoplasm of upper-inner quadrant of unspecified female breast: Secondary | ICD-10-CM

## 2011-09-30 DIAGNOSIS — Z5112 Encounter for antineoplastic immunotherapy: Secondary | ICD-10-CM

## 2011-09-30 DIAGNOSIS — I82B19 Acute embolism and thrombosis of unspecified subclavian vein: Secondary | ICD-10-CM

## 2011-09-30 DIAGNOSIS — C50919 Malignant neoplasm of unspecified site of unspecified female breast: Secondary | ICD-10-CM

## 2011-09-30 DIAGNOSIS — O2230 Deep phlebothrombosis in pregnancy, unspecified trimester: Secondary | ICD-10-CM

## 2011-09-30 LAB — COMPREHENSIVE METABOLIC PANEL
ALT: 12 U/L (ref 0–35)
AST: 19 U/L (ref 0–37)
Albumin: 4.2 g/dL (ref 3.5–5.2)
Alkaline Phosphatase: 102 U/L (ref 39–117)
BUN: 15 mg/dL (ref 6–23)
CO2: 28 mEq/L (ref 19–32)
Calcium: 8.8 mg/dL (ref 8.4–10.5)
Chloride: 100 mEq/L (ref 96–112)
Creatinine, Ser: 0.82 mg/dL (ref 0.50–1.10)
Glucose, Bld: 114 mg/dL — ABNORMAL HIGH (ref 70–99)
Potassium: 3.6 mEq/L (ref 3.5–5.3)
Sodium: 141 mEq/L (ref 135–145)
Total Bilirubin: 0.3 mg/dL (ref 0.3–1.2)
Total Protein: 6.9 g/dL (ref 6.0–8.3)

## 2011-09-30 LAB — CBC WITH DIFFERENTIAL/PLATELET
BASO%: 0.7 % (ref 0.0–2.0)
Basophils Absolute: 0.1 10*3/uL (ref 0.0–0.1)
EOS%: 2.3 % (ref 0.0–7.0)
Eosinophils Absolute: 0.2 10*3/uL (ref 0.0–0.5)
HCT: 37.5 % (ref 34.8–46.6)
HGB: 12.2 g/dL (ref 11.6–15.9)
LYMPH%: 24.1 % (ref 14.0–49.7)
MCH: 26.9 pg (ref 25.1–34.0)
MCHC: 32.5 g/dL (ref 31.5–36.0)
MCV: 82.6 fL (ref 79.5–101.0)
MONO#: 0.7 10*3/uL (ref 0.1–0.9)
MONO%: 9.2 % (ref 0.0–14.0)
NEUT#: 4.5 10*3/uL (ref 1.5–6.5)
NEUT%: 63.7 % (ref 38.4–76.8)
Platelets: 356 10*3/uL (ref 145–400)
RBC: 4.54 10*6/uL (ref 3.70–5.45)
RDW: 17.7 % — ABNORMAL HIGH (ref 11.2–14.5)
WBC: 7.1 10*3/uL (ref 3.9–10.3)
lymph#: 1.7 10*3/uL (ref 0.9–3.3)

## 2011-09-30 LAB — PROTIME-INR
INR: 1 — ABNORMAL LOW (ref 2.00–3.50)
Protime: 12 Seconds (ref 10.6–13.4)

## 2011-09-30 MED ORDER — SODIUM CHLORIDE 0.9 % IJ SOLN
10.0000 mL | INTRAMUSCULAR | Status: DC | PRN
  Administered 2011-09-30: 10 mL
  Filled 2011-09-30: qty 10

## 2011-09-30 MED ORDER — TRASTUZUMAB CHEMO INJECTION 440 MG
4.0000 mg/kg | Freq: Once | INTRAVENOUS | Status: AC
  Administered 2011-09-30: 378 mg via INTRAVENOUS
  Filled 2011-09-30: qty 18

## 2011-09-30 MED ORDER — SODIUM CHLORIDE 0.9 % IV SOLN
25.0000 mg/m2 | Freq: Once | INTRAVENOUS | Status: AC
  Administered 2011-09-30: 52 mg via INTRAVENOUS
  Filled 2011-09-30: qty 5.2

## 2011-09-30 MED ORDER — SODIUM CHLORIDE 0.9 % IV SOLN
Freq: Once | INTRAVENOUS | Status: AC
  Administered 2011-09-30: 14:00:00 via INTRAVENOUS

## 2011-09-30 MED ORDER — PROCHLORPERAZINE MALEATE 10 MG PO TABS
10.0000 mg | ORAL_TABLET | Freq: Once | ORAL | Status: AC
  Administered 2011-09-30: 10 mg via ORAL

## 2011-09-30 MED ORDER — ACETAMINOPHEN 325 MG PO TABS
650.0000 mg | ORAL_TABLET | Freq: Once | ORAL | Status: AC
  Administered 2011-09-30: 650 mg via ORAL

## 2011-09-30 MED ORDER — DIPHENHYDRAMINE HCL 25 MG PO CAPS
50.0000 mg | ORAL_CAPSULE | Freq: Once | ORAL | Status: AC
  Administered 2011-09-30: 50 mg via ORAL

## 2011-09-30 MED ORDER — HEPARIN SOD (PORK) LOCK FLUSH 100 UNIT/ML IV SOLN
500.0000 [IU] | Freq: Once | INTRAVENOUS | Status: AC | PRN
  Administered 2011-09-30: 500 [IU]
  Filled 2011-09-30: qty 5

## 2011-09-30 NOTE — Telephone Encounter (Signed)
Gave patient appointments for 10-14-2011 and 10-28-2011 printed out calendar and gave to the patient in the chemo room

## 2011-09-30 NOTE — Telephone Encounter (Signed)
Pt states that during her office visit today, MD increased her coumadin to 7.5mg  daily

## 2011-09-30 NOTE — Progress Notes (Signed)
Hematology and Oncology Follow Up Visit  [name] [id number] 12/06/1955 55 y.o. 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 She is being followed by her primary care doctor who is controlling her pain medication. Seems to be doing pretty well. She is tolerating chemotherapy fairly well as well. She has ongoing swelling of her right arm but she does have a sleeve for this and wraps it conscientious way. Had no other intercurrent problems. He has had some episodes of nausea.  Medications: I have reviewed the patient's current medications.  Allergies: No Known Allergies  Past Medical History, Surgical history, Social history, and Family History were reviewed and updated.  Review of Systems: Constitutional:  Negative for fever, chills, night sweats, anorexia, weight loss, pain. Cardiovascular: no chest pain or dyspnea on exertion Respiratory: no cough, shortness of breath, or wheezing Neurological: no TIA or stroke symptoms Dermatological: negative ENT: negative Skin Gastrointestinal: no abdominal pain, change in bowel habits, or black or bloody stools Genito-Urinary: no dysuria, trouble voiding, or hematuria Hematological and Lymphatic: negative Breast: negative for breast lumps Musculoskeletal: positive for - joint stiffness, muscle pain and swelling in arm - right Remaining ROS negative.  Physical Exam: Blood pressure 131/80, pulse 85, temperature 98.1 F (36.7 C), temperature source Oral, height 5' 4.5" (1.638 m), weight 207 lb 8 oz (94.121 kg). ECOG:  General appearance: alert, cooperative and appears stated age Head: Normocephalic, without obvious abnormality, atraumatic Neck: lt sided cervical adenopathy and supraclavicular adenopathy which is stable Lymph nodes: as  above, no new adenopathy Cardiac : Normal Pulmonary: Normal Breasts: Chest wall exam unremarkable Abdomen: Normal Extremities normal Neuro: Normal  Lab Results: Lab Results  Component Value Date   WBC 7.1 09/30/2011   HGB 12.2 09/30/2011   HCT 37.5 09/30/2011   MCV 82.6 09/30/2011   PLT 356 09/30/2011     Chemistry      Component Value Date/Time   NA 141 09/16/2011 0907   NA 141 09/16/2011 0907   K 3.5 09/16/2011 0907   K 3.5 09/16/2011 0907   CL 102 09/16/2011 0907   CL 102 09/16/2011 0907   CO2 30 09/16/2011 0907   CO2 30 09/16/2011 0907   BUN 10 09/16/2011 0907   BUN 10 09/16/2011 0907   CREATININE 0.89 09/16/2011 0907   CREATININE 0.89 09/16/2011 0907      Component Value Date/Time   CALCIUM 8.9 09/16/2011 0907   CALCIUM 8.9 09/16/2011 0907   ALKPHOS 106 09/16/2011 0907   ALKPHOS 106 09/16/2011 0907   AST 28 09/16/2011 0907   AST 28 09/16/2011 0907   ALT 16 09/16/2011 0907   ALT 16 09/16/2011 0907   BILITOT 0.2* 09/16/2011 0907   BILITOT 0.2* 09/16/2011 0907       Radiological Studies: chest X-ray n/a Mammogram n/a Bone density n/a  Impression and Plan: [name] is feeling  fairly well. Fortunately she is tolerating chemotherapy well. We'll plan to see her in 2 weeks' time for followup she will have weekly INR checks. Her INR today is down to 1. I am not clear why. Recommended that she stop taking her green  Vegetables as regularly.   More than 50% of the visit was spent in patient-related counselling   Pierce Crane, MD 11/23/20121:37 PM

## 2011-10-07 ENCOUNTER — Other Ambulatory Visit: Payer: Self-pay | Admitting: Physician Assistant

## 2011-10-07 ENCOUNTER — Other Ambulatory Visit (HOSPITAL_BASED_OUTPATIENT_CLINIC_OR_DEPARTMENT_OTHER): Payer: Medicare Other

## 2011-10-07 ENCOUNTER — Other Ambulatory Visit: Payer: Self-pay | Admitting: Oncology

## 2011-10-07 ENCOUNTER — Encounter: Payer: Self-pay | Admitting: Physician Assistant

## 2011-10-07 ENCOUNTER — Telehealth: Payer: Self-pay | Admitting: *Deleted

## 2011-10-07 DIAGNOSIS — Z86718 Personal history of other venous thrombosis and embolism: Secondary | ICD-10-CM

## 2011-10-07 DIAGNOSIS — I82629 Acute embolism and thrombosis of deep veins of unspecified upper extremity: Secondary | ICD-10-CM

## 2011-10-07 DIAGNOSIS — I82409 Acute embolism and thrombosis of unspecified deep veins of unspecified lower extremity: Secondary | ICD-10-CM

## 2011-10-07 DIAGNOSIS — C50919 Malignant neoplasm of unspecified site of unspecified female breast: Secondary | ICD-10-CM

## 2011-10-07 DIAGNOSIS — C50219 Malignant neoplasm of upper-inner quadrant of unspecified female breast: Secondary | ICD-10-CM

## 2011-10-07 HISTORY — DX: Acute embolism and thrombosis of unspecified deep veins of unspecified lower extremity: I82.409

## 2011-10-07 LAB — PROTIME-INR
INR: 2.1 (ref 2.00–3.50)
Protime: 25.2 Seconds — ABNORMAL HIGH (ref 10.6–13.4)

## 2011-10-07 NOTE — Telephone Encounter (Signed)
patient confirmed over the phone the new date and time of the appointment patient will still get her lab and infusion on 10-14-2011

## 2011-10-13 ENCOUNTER — Other Ambulatory Visit: Payer: Medicare Other | Admitting: Lab

## 2011-10-13 ENCOUNTER — Ambulatory Visit: Payer: Medicare Other | Admitting: Oncology

## 2011-10-14 ENCOUNTER — Other Ambulatory Visit: Payer: Self-pay | Admitting: Oncology

## 2011-10-14 ENCOUNTER — Ambulatory Visit: Payer: Medicare Other

## 2011-10-14 ENCOUNTER — Other Ambulatory Visit (HOSPITAL_BASED_OUTPATIENT_CLINIC_OR_DEPARTMENT_OTHER): Payer: Medicare Other | Admitting: Lab

## 2011-10-14 DIAGNOSIS — C50919 Malignant neoplasm of unspecified site of unspecified female breast: Secondary | ICD-10-CM

## 2011-10-14 DIAGNOSIS — O2230 Deep phlebothrombosis in pregnancy, unspecified trimester: Secondary | ICD-10-CM

## 2011-10-14 DIAGNOSIS — I82629 Acute embolism and thrombosis of deep veins of unspecified upper extremity: Secondary | ICD-10-CM

## 2011-10-14 LAB — CBC WITH DIFFERENTIAL/PLATELET
BASO%: 0.9 % (ref 0.0–2.0)
Basophils Absolute: 0.1 10*3/uL (ref 0.0–0.1)
EOS%: 4.1 % (ref 0.0–7.0)
Eosinophils Absolute: 0.3 10*3/uL (ref 0.0–0.5)
HCT: 35 % (ref 34.8–46.6)
HGB: 10.9 g/dL — ABNORMAL LOW (ref 11.6–15.9)
LYMPH%: 25.3 % (ref 14.0–49.7)
MCH: 26.8 pg (ref 25.1–34.0)
MCHC: 31.1 g/dL — ABNORMAL LOW (ref 31.5–36.0)
MCV: 86 fL (ref 79.5–101.0)
MONO#: 0.9 10*3/uL (ref 0.1–0.9)
MONO%: 11.9 % (ref 0.0–14.0)
NEUT#: 4.4 10*3/uL (ref 1.5–6.5)
NEUT%: 57.8 % (ref 38.4–76.8)
Platelets: 266 10*3/uL (ref 145–400)
RBC: 4.07 10*6/uL (ref 3.70–5.45)
RDW: 17.9 % — ABNORMAL HIGH (ref 11.2–14.5)
WBC: 7.6 10*3/uL (ref 3.9–10.3)
lymph#: 1.9 10*3/uL (ref 0.9–3.3)
nRBC: 0 % (ref 0–0)

## 2011-10-14 LAB — PROTIME-INR
INR: 2.6 (ref 2.00–3.50)
Protime: 31.2 Seconds — ABNORMAL HIGH (ref 10.6–13.4)

## 2011-10-14 NOTE — Progress Notes (Signed)
Patient refused to be treated today, stating that "i'm not waiting around to get my stuff started".  Patient rescheduled for Monday October 17, 2011 at 10:15 a.m

## 2011-10-17 ENCOUNTER — Other Ambulatory Visit: Payer: Self-pay

## 2011-10-17 ENCOUNTER — Other Ambulatory Visit: Payer: Self-pay | Admitting: Lab

## 2011-10-17 ENCOUNTER — Ambulatory Visit (HOSPITAL_BASED_OUTPATIENT_CLINIC_OR_DEPARTMENT_OTHER): Payer: Medicare Other

## 2011-10-17 VITALS — BP 113/75 | HR 71 | Temp 98.4°F

## 2011-10-17 DIAGNOSIS — C50219 Malignant neoplasm of upper-inner quadrant of unspecified female breast: Secondary | ICD-10-CM

## 2011-10-17 DIAGNOSIS — Z5111 Encounter for antineoplastic chemotherapy: Secondary | ICD-10-CM

## 2011-10-17 DIAGNOSIS — C50919 Malignant neoplasm of unspecified site of unspecified female breast: Secondary | ICD-10-CM

## 2011-10-17 DIAGNOSIS — Z5112 Encounter for antineoplastic immunotherapy: Secondary | ICD-10-CM

## 2011-10-17 DIAGNOSIS — I82409 Acute embolism and thrombosis of unspecified deep veins of unspecified lower extremity: Secondary | ICD-10-CM

## 2011-10-17 LAB — COMPREHENSIVE METABOLIC PANEL
ALT: 14 U/L (ref 0–35)
AST: 24 U/L (ref 0–37)
Albumin: 3.6 g/dL (ref 3.5–5.2)
Alkaline Phosphatase: 96 U/L (ref 39–117)
BUN: 7 mg/dL (ref 6–23)
CO2: 30 mEq/L (ref 19–32)
Calcium: 9.7 mg/dL (ref 8.4–10.5)
Chloride: 100 mEq/L (ref 96–112)
Creatinine, Ser: 0.75 mg/dL (ref 0.50–1.10)
Glucose, Bld: 117 mg/dL — ABNORMAL HIGH (ref 70–99)
Potassium: 3.2 mEq/L — ABNORMAL LOW (ref 3.5–5.3)
Sodium: 139 mEq/L (ref 135–145)
Total Bilirubin: 0.1 mg/dL — ABNORMAL LOW (ref 0.3–1.2)
Total Protein: 7.5 g/dL (ref 6.0–8.3)

## 2011-10-17 LAB — LACTATE DEHYDROGENASE: LDH: 232 U/L (ref 94–250)

## 2011-10-17 MED ORDER — VINORELBINE TARTRATE CHEMO INJECTION 50 MG/5ML
24.0000 mg/m2 | Freq: Once | INTRAVENOUS | Status: AC
  Administered 2011-10-17: 50 mg via INTRAVENOUS
  Filled 2011-10-17: qty 5

## 2011-10-17 MED ORDER — DIPHENHYDRAMINE HCL 25 MG PO CAPS
50.0000 mg | ORAL_CAPSULE | Freq: Once | ORAL | Status: AC
  Administered 2011-10-17: 50 mg via ORAL

## 2011-10-17 MED ORDER — HEPARIN SOD (PORK) LOCK FLUSH 100 UNIT/ML IV SOLN
500.0000 [IU] | Freq: Once | INTRAVENOUS | Status: AC | PRN
  Administered 2011-10-17: 500 [IU]
  Filled 2011-10-17: qty 5

## 2011-10-17 MED ORDER — WARFARIN SODIUM 5 MG PO TABS: 10.0000 mg | ORAL_TABLET | Freq: Every day | ORAL | Status: DC

## 2011-10-17 MED ORDER — SODIUM CHLORIDE 0.9 % IJ SOLN
10.0000 mL | INTRAMUSCULAR | Status: DC | PRN
  Administered 2011-10-17: 10 mL
  Filled 2011-10-17: qty 10

## 2011-10-17 MED ORDER — TRASTUZUMAB CHEMO INJECTION 440 MG
4.0000 mg/kg | Freq: Once | INTRAVENOUS | Status: AC
  Administered 2011-10-17: 378 mg via INTRAVENOUS
  Filled 2011-10-17: qty 18

## 2011-10-17 MED ORDER — SODIUM CHLORIDE 0.9 % IV SOLN
Freq: Once | INTRAVENOUS | Status: AC
  Administered 2011-10-17: 10:00:00 via INTRAVENOUS

## 2011-10-17 MED ORDER — PROCHLORPERAZINE MALEATE 10 MG PO TABS
10.0000 mg | ORAL_TABLET | Freq: Once | ORAL | Status: AC
  Administered 2011-10-17: 10 mg via ORAL

## 2011-10-17 MED ORDER — ACETAMINOPHEN 325 MG PO TABS
650.0000 mg | ORAL_TABLET | Freq: Once | ORAL | Status: AC
  Administered 2011-10-17: 650 mg via ORAL

## 2011-10-18 ENCOUNTER — Other Ambulatory Visit: Payer: Self-pay | Admitting: *Deleted

## 2011-10-18 DIAGNOSIS — I82409 Acute embolism and thrombosis of unspecified deep veins of unspecified lower extremity: Secondary | ICD-10-CM

## 2011-10-18 NOTE — Telephone Encounter (Signed)
WARFARIN 5MG  WAS REFILLED VIA ELECTRONIC SYSTEM ON 10/17/11. THE PHARMACY DID NOT RECEIVED THE REFILL SO REFILL WAS GIVEN VIA PHONE.

## 2011-10-24 ENCOUNTER — Other Ambulatory Visit: Payer: Self-pay | Admitting: *Deleted

## 2011-10-24 DIAGNOSIS — C50919 Malignant neoplasm of unspecified site of unspecified female breast: Secondary | ICD-10-CM

## 2011-10-24 MED ORDER — HYDROCHLOROTHIAZIDE 25 MG PO TABS: 25.0000 mg | ORAL_TABLET | Freq: Every day | ORAL | Status: DC

## 2011-10-25 ENCOUNTER — Other Ambulatory Visit: Payer: Self-pay | Admitting: Certified Registered Nurse Anesthetist

## 2011-10-28 ENCOUNTER — Other Ambulatory Visit (HOSPITAL_BASED_OUTPATIENT_CLINIC_OR_DEPARTMENT_OTHER): Payer: Medicare Other | Admitting: Lab

## 2011-10-28 ENCOUNTER — Ambulatory Visit (HOSPITAL_BASED_OUTPATIENT_CLINIC_OR_DEPARTMENT_OTHER): Payer: Medicare Other

## 2011-10-28 ENCOUNTER — Other Ambulatory Visit: Payer: Self-pay | Admitting: Oncology

## 2011-10-28 DIAGNOSIS — C50219 Malignant neoplasm of upper-inner quadrant of unspecified female breast: Secondary | ICD-10-CM

## 2011-10-28 DIAGNOSIS — I82409 Acute embolism and thrombosis of unspecified deep veins of unspecified lower extremity: Secondary | ICD-10-CM

## 2011-10-28 DIAGNOSIS — Z5111 Encounter for antineoplastic chemotherapy: Secondary | ICD-10-CM

## 2011-10-28 DIAGNOSIS — C50919 Malignant neoplasm of unspecified site of unspecified female breast: Secondary | ICD-10-CM

## 2011-10-28 DIAGNOSIS — C801 Malignant (primary) neoplasm, unspecified: Secondary | ICD-10-CM

## 2011-10-28 DIAGNOSIS — Z5112 Encounter for antineoplastic immunotherapy: Secondary | ICD-10-CM

## 2011-10-28 LAB — COMPREHENSIVE METABOLIC PANEL
ALT: 12 U/L (ref 0–35)
AST: 21 U/L (ref 0–37)
Albumin: 4.2 g/dL (ref 3.5–5.2)
Alkaline Phosphatase: 75 U/L (ref 39–117)
BUN: 11 mg/dL (ref 6–23)
CO2: 26 mEq/L (ref 19–32)
Calcium: 9.7 mg/dL (ref 8.4–10.5)
Chloride: 102 mEq/L (ref 96–112)
Creatinine, Ser: 0.91 mg/dL (ref 0.50–1.10)
Glucose, Bld: 122 mg/dL — ABNORMAL HIGH (ref 70–99)
Potassium: 3.5 mEq/L (ref 3.5–5.3)
Sodium: 141 mEq/L (ref 135–145)
Total Bilirubin: 0.3 mg/dL (ref 0.3–1.2)
Total Protein: 7.5 g/dL (ref 6.0–8.3)

## 2011-10-28 LAB — CBC WITH DIFFERENTIAL/PLATELET
BASO%: 0.5 % (ref 0.0–2.0)
Basophils Absolute: 0 10*3/uL (ref 0.0–0.1)
EOS%: 1.6 % (ref 0.0–7.0)
Eosinophils Absolute: 0.1 10*3/uL (ref 0.0–0.5)
HCT: 38.1 % (ref 34.8–46.6)
HGB: 12.5 g/dL (ref 11.6–15.9)
LYMPH%: 23.9 % (ref 14.0–49.7)
MCH: 26.9 pg (ref 25.1–34.0)
MCHC: 32.8 g/dL (ref 31.5–36.0)
MCV: 81.9 fL (ref 79.5–101.0)
MONO#: 0.3 10*3/uL (ref 0.1–0.9)
MONO%: 5.4 % (ref 0.0–14.0)
NEUT#: 4 10*3/uL (ref 1.5–6.5)
NEUT%: 68.6 % (ref 38.4–76.8)
Platelets: 427 10*3/uL — ABNORMAL HIGH (ref 145–400)
RBC: 4.65 10*6/uL (ref 3.70–5.45)
RDW: 18.2 % — ABNORMAL HIGH (ref 11.2–14.5)
WBC: 5.8 10*3/uL (ref 3.9–10.3)
lymph#: 1.4 10*3/uL (ref 0.9–3.3)
nRBC: 0 % (ref 0–0)

## 2011-10-28 LAB — PROTHROMBIN TIME
INR: 3.08 — ABNORMAL HIGH (ref <1.50)
Prothrombin Time: 32.3 seconds — ABNORMAL HIGH (ref 11.6–15.2)

## 2011-10-28 MED ORDER — SODIUM CHLORIDE 0.9 % IV SOLN
Freq: Once | INTRAVENOUS | Status: AC
  Administered 2011-10-28: 11:00:00 via INTRAVENOUS

## 2011-10-28 MED ORDER — VINORELBINE TARTRATE CHEMO INJECTION 50 MG/5ML
25.0000 mg/m2 | Freq: Once | INTRAVENOUS | Status: AC
  Administered 2011-10-28: 52 mg via INTRAVENOUS
  Filled 2011-10-28: qty 5.2

## 2011-10-28 MED ORDER — DIPHENHYDRAMINE HCL 25 MG PO CAPS
50.0000 mg | ORAL_CAPSULE | Freq: Once | ORAL | Status: AC
  Administered 2011-10-28: 50 mg via ORAL

## 2011-10-28 MED ORDER — ACETAMINOPHEN 325 MG PO TABS
650.0000 mg | ORAL_TABLET | Freq: Once | ORAL | Status: AC
  Administered 2011-10-28: 650 mg via ORAL

## 2011-10-28 MED ORDER — SODIUM CHLORIDE 0.9 % IJ SOLN
10.0000 mL | INTRAMUSCULAR | Status: DC | PRN
  Administered 2011-10-28: 10 mL
  Filled 2011-10-28: qty 10

## 2011-10-28 MED ORDER — TRASTUZUMAB CHEMO INJECTION 440 MG
4.0000 mg/kg | Freq: Once | INTRAVENOUS | Status: AC
  Administered 2011-10-28: 378 mg via INTRAVENOUS
  Filled 2011-10-28: qty 18

## 2011-10-28 MED ORDER — PROCHLORPERAZINE MALEATE 10 MG PO TABS
10.0000 mg | ORAL_TABLET | Freq: Once | ORAL | Status: AC
  Administered 2011-10-28: 10 mg via ORAL

## 2011-10-28 MED ORDER — HEPARIN SOD (PORK) LOCK FLUSH 100 UNIT/ML IV SOLN
500.0000 [IU] | Freq: Once | INTRAVENOUS | Status: AC | PRN
  Administered 2011-10-28: 500 [IU]
  Filled 2011-10-28: qty 5

## 2011-11-06 ENCOUNTER — Emergency Department (HOSPITAL_COMMUNITY)
Admission: EM | Admit: 2011-11-06 | Discharge: 2011-11-06 | Discharge status: Against Medical Advice | Payer: Medicare Other | Attending: Emergency Medicine | Admitting: Emergency Medicine

## 2011-11-06 ENCOUNTER — Telehealth: Payer: Self-pay | Admitting: Oncology

## 2011-11-06 ENCOUNTER — Encounter (HOSPITAL_COMMUNITY): Payer: Self-pay | Admitting: Emergency Medicine

## 2011-11-06 DIAGNOSIS — R059 Cough, unspecified: Secondary | ICD-10-CM | POA: Insufficient documentation

## 2011-11-06 DIAGNOSIS — R1110 Vomiting, unspecified: Secondary | ICD-10-CM | POA: Insufficient documentation

## 2011-11-06 DIAGNOSIS — R05 Cough: Secondary | ICD-10-CM | POA: Insufficient documentation

## 2011-11-06 DIAGNOSIS — IMO0001 Reserved for inherently not codable concepts without codable children: Secondary | ICD-10-CM | POA: Insufficient documentation

## 2011-11-06 DIAGNOSIS — J111 Influenza due to unidentified influenza virus with other respiratory manifestations: Secondary | ICD-10-CM | POA: Insufficient documentation

## 2011-11-06 NOTE — ED Notes (Signed)
C/o dry cough, vomiting, generalized body aches, and sore throat since Friday.  Denies fever.

## 2011-11-06 NOTE — Telephone Encounter (Signed)
On call: patient called reporting congestion and aching "I have a virus", no fever. Recommended plenty of fluids and tylenol, or to go to  ED or to primary care if needed.

## 2011-11-16 ENCOUNTER — Other Ambulatory Visit: Payer: Self-pay | Admitting: Oncology

## 2011-11-16 DIAGNOSIS — I82409 Acute embolism and thrombosis of unspecified deep veins of unspecified lower extremity: Secondary | ICD-10-CM

## 2011-11-16 DIAGNOSIS — C50919 Malignant neoplasm of unspecified site of unspecified female breast: Secondary | ICD-10-CM

## 2011-11-17 ENCOUNTER — Ambulatory Visit (HOSPITAL_BASED_OUTPATIENT_CLINIC_OR_DEPARTMENT_OTHER): Payer: Medicare Other | Admitting: Oncology

## 2011-11-17 ENCOUNTER — Other Ambulatory Visit: Payer: Medicare Other | Admitting: Lab

## 2011-11-17 VITALS — BP 114/78 | HR 89 | Temp 98.2°F | Ht 64.5 in | Wt 207.2 lb

## 2011-11-17 DIAGNOSIS — C50919 Malignant neoplasm of unspecified site of unspecified female breast: Secondary | ICD-10-CM

## 2011-11-17 DIAGNOSIS — O2230 Deep phlebothrombosis in pregnancy, unspecified trimester: Secondary | ICD-10-CM

## 2011-11-17 DIAGNOSIS — I82409 Acute embolism and thrombosis of unspecified deep veins of unspecified lower extremity: Secondary | ICD-10-CM

## 2011-11-17 LAB — CBC WITH DIFFERENTIAL/PLATELET
BASO%: 0.9 % (ref 0.0–2.0)
Basophils Absolute: 0.1 10*3/uL (ref 0.0–0.1)
EOS%: 3.2 % (ref 0.0–7.0)
Eosinophils Absolute: 0.2 10*3/uL (ref 0.0–0.5)
HCT: 38.1 % (ref 34.8–46.6)
HGB: 12.4 g/dL (ref 11.6–15.9)
LYMPH%: 31.7 % (ref 14.0–49.7)
MCH: 26.6 pg (ref 25.1–34.0)
MCHC: 32.5 g/dL (ref 31.5–36.0)
MCV: 81.6 fL (ref 79.5–101.0)
MONO#: 0.6 10*3/uL (ref 0.1–0.9)
MONO%: 9 % (ref 0.0–14.0)
NEUT#: 3.8 10*3/uL (ref 1.5–6.5)
NEUT%: 55.2 % (ref 38.4–76.8)
Platelets: 222 10*3/uL (ref 145–400)
RBC: 4.67 10*6/uL (ref 3.70–5.45)
RDW: 18.3 % — ABNORMAL HIGH (ref 11.2–14.5)
WBC: 6.9 10*3/uL (ref 3.9–10.3)
lymph#: 2.2 10*3/uL (ref 0.9–3.3)
nRBC: 0 % (ref 0–0)

## 2011-11-17 LAB — PROTIME-INR
INR: 1.1 — ABNORMAL LOW (ref 2.00–3.50)
Protime: 13.2 Seconds (ref 10.6–13.4)

## 2011-11-17 NOTE — Progress Notes (Signed)
Hematology and Oncology Follow Up Visit  Ebonye Reade [id number] [birth year] 56 y.o. 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 She is being followed by her primary care doctor who is controlling her pain medication. Seems to be doing pretty well. She had a good holiday. She was on an antibiotic and cut her coumadin in 1/2.he rpain control is fairly good.  Medications: I have reviewed the patient's current medications.  Allergies:  Allergies  Allergen Reactions  . Hydrocodone-Acetaminophen Itching and Rash    Past Medical History, Surgical history, Social history, and Family History were reviewed and updated.  Review of Systems: Constitutional:  Negative for fever, chills, night sweats, anorexia, weight loss, pain. Cardiovascular: no chest pain or dyspnea on exertion Respiratory: no cough, shortness of breath, or wheezing Neurological: no TIA or stroke symptoms Dermatological: negative ENT: negative Skin Gastrointestinal: no abdominal pain, change in bowel habits, or black or bloody stools Genito-Urinary: no dysuria, trouble voiding, or hematuria Hematological and Lymphatic: negative Breast: negative for breast lumps Musculoskeletal: positive for - joint stiffness, muscle pain and swelling in arm - right Remaining ROS negative.  Physical Exam: Blood pressure 114/78, pulse 89, temperature 98.2 F (36.8 C), height 5' 4.5" (1.638 m), weight 207 lb 3.2 oz (93.985 kg). ECOG:  General appearance: alert, cooperative and appears stated age Head: Normocephalic, without obvious abnormality, atraumatic Neck: lt sided cervical adenopathy and supraclavicular adenopathy which is stable, small cw nodules are unchanged. Lymph nodes: as above, no new adenopathy Cardiac : Normal Pulmonary:  Normal Breasts: Chest wall exam unremarkable Abdomen: Normal Extremities normal Neuro: Normal  Lab Results: Lab Results  Component Value Date   WBC 6.9 11/17/2011   HGB 12.4 11/17/2011   HCT 38.1 11/17/2011   MCV 81.6 11/17/2011   PLT 222 11/17/2011     Chemistry      Component Value Date/Time   NA 141 10/28/2011 1104   K 3.5 10/28/2011 1104   CL 102 10/28/2011 1104   CO2 26 10/28/2011 1104   BUN 11 10/28/2011 1104   CREATININE 0.91 10/28/2011 1104      Component Value Date/Time   CALCIUM 9.7 10/28/2011 1104   ALKPHOS 75 10/28/2011 1104   AST 21 10/28/2011 1104   ALT 12 10/28/2011 1104   BILITOT 0.3 10/28/2011 1104       Radiological Studies: chest X-ray n/a Mammogram n/a Bone density n/a  Impression and Plan: Dacoda is feeling  fairly well. Fortunately she is tolerating chemotherapy well. I have scheduled her to return on 1/18 for chemo/herceptin and then be treated q 2weeks. I have scheduled her for PET and CT of the brain prior to return.   More than 50% of the visit was spent in patient-related counselling   Pierce Crane, MD 1/10/20132:59 PM

## 2011-11-18 ENCOUNTER — Telehealth: Payer: Self-pay | Admitting: *Deleted

## 2011-11-18 ENCOUNTER — Telehealth: Payer: Self-pay | Admitting: Oncology

## 2011-11-18 ENCOUNTER — Other Ambulatory Visit: Payer: Self-pay | Admitting: *Deleted

## 2011-11-18 DIAGNOSIS — I428 Other cardiomyopathies: Secondary | ICD-10-CM

## 2011-11-18 NOTE — Telephone Encounter (Signed)
Called pt. And instructed her to increase her coumadin to 10mg  daily and to come this Monday to have it checked.

## 2011-11-18 NOTE — Telephone Encounter (Signed)
S/w the pt regarding her lab appt had been moved from Friday 11/25/2011 to 11/21/2011 due to she needs a pt/inr done.

## 2011-11-21 ENCOUNTER — Other Ambulatory Visit (HOSPITAL_BASED_OUTPATIENT_CLINIC_OR_DEPARTMENT_OTHER): Payer: Medicare Other | Admitting: Lab

## 2011-11-21 ENCOUNTER — Other Ambulatory Visit: Payer: Self-pay | Admitting: *Deleted

## 2011-11-21 DIAGNOSIS — I428 Other cardiomyopathies: Secondary | ICD-10-CM

## 2011-11-21 DIAGNOSIS — C50919 Malignant neoplasm of unspecified site of unspecified female breast: Secondary | ICD-10-CM

## 2011-11-21 DIAGNOSIS — Z7901 Long term (current) use of anticoagulants: Secondary | ICD-10-CM

## 2011-11-21 LAB — PROTIME-INR
INR: 2 (ref 2.00–3.50)
Protime: 24 Seconds — ABNORMAL HIGH (ref 10.6–13.4)

## 2011-11-21 NOTE — Progress Notes (Signed)
Called pt. And left message on cell phone to do coumadin 7.5mg  today and then I will call her to review her coumadin dosing for the rest of the week.  She is to have PT on Friday 11/25/11

## 2011-11-22 ENCOUNTER — Telehealth: Payer: Self-pay | Admitting: *Deleted

## 2011-11-22 NOTE — Telephone Encounter (Signed)
Pt. Returned call late yesterday and states she had already taken her coumadin at dose of 10mg .   Reviewed with Pilar Plate PA this am and called pt. And instructed her to take 7.5mg  today and tomorrow (Wed. 11/23/11) and then 10mg  on Thursday.  We will recheck this when she comes on Friday 11/25/11.  Pt. Verbalizes understanding.

## 2011-11-24 ENCOUNTER — Other Ambulatory Visit: Payer: Self-pay

## 2011-11-25 ENCOUNTER — Other Ambulatory Visit: Payer: Medicare Other | Admitting: Lab

## 2011-11-25 ENCOUNTER — Ambulatory Visit (HOSPITAL_BASED_OUTPATIENT_CLINIC_OR_DEPARTMENT_OTHER): Payer: Medicare Other

## 2011-11-25 VITALS — BP 136/78 | HR 87 | Temp 98.0°F

## 2011-11-25 DIAGNOSIS — C50919 Malignant neoplasm of unspecified site of unspecified female breast: Secondary | ICD-10-CM

## 2011-11-25 DIAGNOSIS — Z5112 Encounter for antineoplastic immunotherapy: Secondary | ICD-10-CM

## 2011-11-25 DIAGNOSIS — O2230 Deep phlebothrombosis in pregnancy, unspecified trimester: Secondary | ICD-10-CM

## 2011-11-25 LAB — COMPREHENSIVE METABOLIC PANEL
ALT: 8 U/L (ref 0–35)
AST: 17 U/L (ref 0–37)
Albumin: 4.1 g/dL (ref 3.5–5.2)
Alkaline Phosphatase: 80 U/L (ref 39–117)
BUN: 14 mg/dL (ref 6–23)
CO2: 28 mEq/L (ref 19–32)
Calcium: 8.6 mg/dL (ref 8.4–10.5)
Chloride: 102 mEq/L (ref 96–112)
Creatinine, Ser: 0.86 mg/dL (ref 0.50–1.10)
Glucose, Bld: 140 mg/dL — ABNORMAL HIGH (ref 70–99)
Potassium: 3.5 mEq/L (ref 3.5–5.3)
Sodium: 140 mEq/L (ref 135–145)
Total Bilirubin: 0.2 mg/dL — ABNORMAL LOW (ref 0.3–1.2)
Total Protein: 6.7 g/dL (ref 6.0–8.3)

## 2011-11-25 LAB — CBC WITH DIFFERENTIAL/PLATELET
BASO%: 0.7 % (ref 0.0–2.0)
Basophils Absolute: 0.1 10*3/uL (ref 0.0–0.1)
EOS%: 4.3 % (ref 0.0–7.0)
Eosinophils Absolute: 0.3 10*3/uL (ref 0.0–0.5)
HCT: 37.8 % (ref 34.8–46.6)
HGB: 12.3 g/dL (ref 11.6–15.9)
LYMPH%: 29.7 % (ref 14.0–49.7)
MCH: 26.9 pg (ref 25.1–34.0)
MCHC: 32.5 g/dL (ref 31.5–36.0)
MCV: 82.5 fL (ref 79.5–101.0)
MONO#: 0.8 10*3/uL (ref 0.1–0.9)
MONO%: 9.9 % (ref 0.0–14.0)
NEUT#: 4.2 10*3/uL (ref 1.5–6.5)
NEUT%: 55.4 % (ref 38.4–76.8)
Platelets: 260 10*3/uL (ref 145–400)
RBC: 4.58 10*6/uL (ref 3.70–5.45)
RDW: 18.9 % — ABNORMAL HIGH (ref 11.2–14.5)
WBC: 7.6 10*3/uL (ref 3.9–10.3)
lymph#: 2.3 10*3/uL (ref 0.9–3.3)
nRBC: 0 % (ref 0–0)

## 2011-11-25 LAB — PROTIME-INR
INR: 2.7 (ref 2.00–3.50)
Protime: 32.4 Seconds — ABNORMAL HIGH (ref 10.6–13.4)

## 2011-11-25 LAB — LACTATE DEHYDROGENASE: LDH: 205 U/L (ref 94–250)

## 2011-11-25 LAB — CANCER ANTIGEN 27.29: CA 27.29: 44 U/mL — ABNORMAL HIGH (ref 0–39)

## 2011-11-25 MED ORDER — VINORELBINE TARTRATE CHEMO INJECTION 50 MG/5ML
25.0000 mg/m2 | Freq: Once | INTRAVENOUS | Status: AC
  Administered 2011-11-25: 52 mg via INTRAVENOUS
  Filled 2011-11-25: qty 5.2

## 2011-11-25 MED ORDER — HEPARIN SOD (PORK) LOCK FLUSH 100 UNIT/ML IV SOLN
500.0000 [IU] | Freq: Once | INTRAVENOUS | Status: AC | PRN
  Administered 2011-11-25: 500 [IU]
  Filled 2011-11-25: qty 5

## 2011-11-25 MED ORDER — DIPHENHYDRAMINE HCL 25 MG PO CAPS
50.0000 mg | ORAL_CAPSULE | Freq: Once | ORAL | Status: AC
  Administered 2011-11-25: 50 mg via ORAL

## 2011-11-25 MED ORDER — SODIUM CHLORIDE 0.9 % IV SOLN
4.0000 mg/kg | Freq: Once | INTRAVENOUS | Status: AC
  Administered 2011-11-25: 378 mg via INTRAVENOUS
  Filled 2011-11-25: qty 18

## 2011-11-25 MED ORDER — SODIUM CHLORIDE 0.9 % IJ SOLN
10.0000 mL | INTRAMUSCULAR | Status: DC | PRN
  Administered 2011-11-25: 10 mL
  Filled 2011-11-25: qty 10

## 2011-11-25 MED ORDER — ACETAMINOPHEN 325 MG PO TABS
650.0000 mg | ORAL_TABLET | Freq: Once | ORAL | Status: AC
  Administered 2011-11-25: 650 mg via ORAL

## 2011-11-25 MED ORDER — SODIUM CHLORIDE 0.9 % IV SOLN
Freq: Once | INTRAVENOUS | Status: AC
  Administered 2011-11-25: 10:00:00 via INTRAVENOUS

## 2011-11-25 MED ORDER — PROCHLORPERAZINE MALEATE 10 MG PO TABS
10.0000 mg | ORAL_TABLET | Freq: Once | ORAL | Status: AC
  Administered 2011-11-25: 10 mg via ORAL

## 2011-11-25 NOTE — Patient Instructions (Signed)
Pt ambulates out of dept with spouse.  Aware of next appt. dph

## 2011-12-01 ENCOUNTER — Other Ambulatory Visit (HOSPITAL_COMMUNITY): Payer: Medicare Other

## 2011-12-02 ENCOUNTER — Encounter (HOSPITAL_COMMUNITY)
Admission: RE | Admit: 2011-12-02 | Discharge: 2011-12-02 | Disposition: A | Discharge status: Home / Self Care | Payer: Medicare Other | Source: Ambulatory Visit | Attending: Oncology | Admitting: Oncology

## 2011-12-02 DIAGNOSIS — Z86718 Personal history of other venous thrombosis and embolism: Secondary | ICD-10-CM | POA: Insufficient documentation

## 2011-12-02 DIAGNOSIS — M7989 Other specified soft tissue disorders: Secondary | ICD-10-CM | POA: Insufficient documentation

## 2011-12-02 DIAGNOSIS — C50919 Malignant neoplasm of unspecified site of unspecified female breast: Secondary | ICD-10-CM

## 2011-12-02 DIAGNOSIS — R609 Edema, unspecified: Secondary | ICD-10-CM | POA: Insufficient documentation

## 2011-12-02 DIAGNOSIS — O2230 Deep phlebothrombosis in pregnancy, unspecified trimester: Secondary | ICD-10-CM

## 2011-12-02 DIAGNOSIS — Z7901 Long term (current) use of anticoagulants: Secondary | ICD-10-CM | POA: Insufficient documentation

## 2011-12-02 DIAGNOSIS — R599 Enlarged lymph nodes, unspecified: Secondary | ICD-10-CM | POA: Insufficient documentation

## 2011-12-02 LAB — GLUCOSE, CAPILLARY: Glucose-Capillary: 109 mg/dL — ABNORMAL HIGH (ref 70–99)

## 2011-12-02 MED ORDER — FLUDEOXYGLUCOSE F - 18 (FDG) INJECTION
15.7000 | Freq: Once | INTRAVENOUS | Status: AC | PRN
  Administered 2011-12-02: 15.7 via INTRAVENOUS

## 2011-12-09 ENCOUNTER — Other Ambulatory Visit: Payer: Self-pay | Admitting: *Deleted

## 2011-12-09 ENCOUNTER — Other Ambulatory Visit (HOSPITAL_BASED_OUTPATIENT_CLINIC_OR_DEPARTMENT_OTHER): Payer: Medicare Other | Admitting: Lab

## 2011-12-09 ENCOUNTER — Ambulatory Visit (HOSPITAL_BASED_OUTPATIENT_CLINIC_OR_DEPARTMENT_OTHER): Payer: Medicare Other

## 2011-12-09 VITALS — BP 126/85 | HR 89 | Temp 97.6°F

## 2011-12-09 DIAGNOSIS — C50919 Malignant neoplasm of unspecified site of unspecified female breast: Secondary | ICD-10-CM

## 2011-12-09 DIAGNOSIS — Z86718 Personal history of other venous thrombosis and embolism: Secondary | ICD-10-CM

## 2011-12-09 DIAGNOSIS — Z5111 Encounter for antineoplastic chemotherapy: Secondary | ICD-10-CM

## 2011-12-09 DIAGNOSIS — I428 Other cardiomyopathies: Secondary | ICD-10-CM

## 2011-12-09 DIAGNOSIS — Z5112 Encounter for antineoplastic immunotherapy: Secondary | ICD-10-CM

## 2011-12-09 DIAGNOSIS — F1910 Other psychoactive substance abuse, uncomplicated: Secondary | ICD-10-CM

## 2011-12-09 DIAGNOSIS — I10 Essential (primary) hypertension: Secondary | ICD-10-CM

## 2011-12-09 DIAGNOSIS — C50219 Malignant neoplasm of upper-inner quadrant of unspecified female breast: Secondary | ICD-10-CM

## 2011-12-09 LAB — CBC WITH DIFFERENTIAL/PLATELET
BASO%: 0.8 % (ref 0.0–2.0)
Basophils Absolute: 0.1 10*3/uL (ref 0.0–0.1)
EOS%: 3 % (ref 0.0–7.0)
Eosinophils Absolute: 0.2 10*3/uL (ref 0.0–0.5)
HCT: 39.5 % (ref 34.8–46.6)
HGB: 12.8 g/dL (ref 11.6–15.9)
LYMPH%: 21.8 % (ref 14.0–49.7)
MCH: 26.8 pg (ref 25.1–34.0)
MCHC: 32.4 g/dL (ref 31.5–36.0)
MCV: 82.6 fL (ref 79.5–101.0)
MONO#: 0.7 10*3/uL (ref 0.1–0.9)
MONO%: 9.4 % (ref 0.0–14.0)
NEUT#: 4.8 10*3/uL (ref 1.5–6.5)
NEUT%: 65 % (ref 38.4–76.8)
Platelets: 321 10*3/uL (ref 145–400)
RBC: 4.78 10*6/uL (ref 3.70–5.45)
RDW: 18 % — ABNORMAL HIGH (ref 11.2–14.5)
WBC: 7.4 10*3/uL (ref 3.9–10.3)
lymph#: 1.6 10*3/uL (ref 0.9–3.3)
nRBC: 0 % (ref 0–0)

## 2011-12-09 LAB — PROTIME-INR
INR: 4.1 — ABNORMAL HIGH (ref 2.00–3.50)
Protime: 49.2 Seconds — ABNORMAL HIGH (ref 10.6–13.4)

## 2011-12-09 MED ORDER — DEXAMETHASONE SODIUM PHOSPHATE 10 MG/ML IJ SOLN
10.0000 mg | Freq: Once | INTRAMUSCULAR | Status: AC
  Administered 2011-12-09: 10 mg via INTRAVENOUS

## 2011-12-09 MED ORDER — SODIUM CHLORIDE 0.9 % IJ SOLN
10.0000 mL | INTRAMUSCULAR | Status: DC | PRN
  Administered 2011-12-09: 10 mL
  Filled 2011-12-09: qty 10

## 2011-12-09 MED ORDER — ONDANSETRON 8 MG/50ML IVPB (CHCC)
8.0000 mg | Freq: Once | INTRAVENOUS | Status: AC
  Administered 2011-12-09: 8 mg via INTRAVENOUS

## 2011-12-09 MED ORDER — SODIUM CHLORIDE 0.9 % IV SOLN
800.0000 mg/m2 | Freq: Once | INTRAVENOUS | Status: AC
  Administered 2011-12-09: 1672 mg via INTRAVENOUS
  Filled 2011-12-09: qty 44

## 2011-12-09 MED ORDER — ACETAMINOPHEN 325 MG PO TABS
650.0000 mg | ORAL_TABLET | Freq: Once | ORAL | Status: AC
  Administered 2011-12-09: 650 mg via ORAL

## 2011-12-09 MED ORDER — DIPHENHYDRAMINE HCL 25 MG PO CAPS
50.0000 mg | ORAL_CAPSULE | Freq: Once | ORAL | Status: AC
  Administered 2011-12-09: 50 mg via ORAL

## 2011-12-09 MED ORDER — TRASTUZUMAB CHEMO INJECTION 440 MG
4.0000 mg/kg | Freq: Once | INTRAVENOUS | Status: AC
  Administered 2011-12-09: 378 mg via INTRAVENOUS
  Filled 2011-12-09: qty 18

## 2011-12-09 MED ORDER — SODIUM CHLORIDE 0.9 % IV SOLN
266.8000 mg | Freq: Once | INTRAVENOUS | Status: AC
  Administered 2011-12-09: 270 mg via INTRAVENOUS
  Filled 2011-12-09: qty 27

## 2011-12-09 MED ORDER — HEPARIN SOD (PORK) LOCK FLUSH 100 UNIT/ML IV SOLN
500.0000 [IU] | Freq: Once | INTRAVENOUS | Status: AC | PRN
  Administered 2011-12-09: 500 [IU]
  Filled 2011-12-09: qty 5

## 2011-12-09 MED ORDER — SODIUM CHLORIDE 0.9 % IV SOLN
Freq: Once | INTRAVENOUS | Status: AC
  Administered 2011-12-09: 12:00:00 via INTRAVENOUS

## 2011-12-09 MED ORDER — HYDROCOD POLST-CHLORPHEN POLST 10-8 MG/5ML PO LQCR: 5.0000 mL | Freq: Two times a day (BID) | ORAL | Status: DC

## 2011-12-09 NOTE — Progress Notes (Signed)
Per MD, notified Aquilla Hacker, RN to instruct pt to "hold" coumadin until Tuesday 12/13/11 and to repeat labs on Friday 12/16/11. Scheduling will call pt with appt time

## 2011-12-12 ENCOUNTER — Other Ambulatory Visit: Payer: Self-pay | Admitting: Physician Assistant

## 2011-12-12 ENCOUNTER — Telehealth: Payer: Self-pay | Admitting: *Deleted

## 2011-12-12 DIAGNOSIS — I428 Other cardiomyopathies: Secondary | ICD-10-CM

## 2011-12-12 DIAGNOSIS — C50919 Malignant neoplasm of unspecified site of unspecified female breast: Secondary | ICD-10-CM

## 2011-12-12 NOTE — Telephone Encounter (Signed)
DR.RUBIN IS OUT OF THE OFFICE UNTIL TOMORROW. SPOKE TO CHRIS SCHERER,PA. DR.RUBIN WILL CALL PT. WITH THE TREATMENT PLAN. NOTIFIED PT. SHE VOICES UNDERSTANDING.

## 2011-12-12 NOTE — Telephone Encounter (Signed)
Called [name] who says she is feeling better today and has been able to get up.  "My body is tired."  Has not slept well but on and off and says this is to be expected.  "I spoke with Thayer Ohm PA earlier and I feel better now.  I am going to see her on Thursday."  Expressed concerns about her current diagnosis and treatment.  Says she did not have any problems until after receiving navelbine and now this.  Instructed her to talk with providers about these concerns.

## 2011-12-12 NOTE — Telephone Encounter (Signed)
Message copied by Augusto Garbe on Mon Dec 12, 2011  5:10 PM ------      Message from: Gaylord Shih      Created: Fri Dec 09, 2011  2:19 PM      Regarding: chemo f/u call      Contact: [phone]       Carbo, gemzar. Dr Donnie Coffin.

## 2011-12-12 NOTE — Telephone Encounter (Signed)
pateint confirmed appointments over the phone patient will stop by on 12-15-2011 to pick up her calendar

## 2011-12-14 LAB — COMPREHENSIVE METABOLIC PANEL
ALT: 11 U/L (ref 0–35)
AST: 19 U/L (ref 0–37)
Albumin: 4.3 g/dL (ref 3.5–5.2)
Alkaline Phosphatase: 85 U/L (ref 39–117)
BUN: 12 mg/dL (ref 6–23)
CO2: 30 mEq/L (ref 19–32)
Calcium: 9.2 mg/dL (ref 8.4–10.5)
Chloride: 99 mEq/L (ref 96–112)
Creatinine, Ser: 0.86 mg/dL (ref 0.50–1.10)
Glucose, Bld: 85 mg/dL (ref 70–99)
Potassium: 3.4 mEq/L — ABNORMAL LOW (ref 3.5–5.3)
Sodium: 139 mEq/L (ref 135–145)
Total Bilirubin: 0.2 mg/dL — ABNORMAL LOW (ref 0.3–1.2)
Total Protein: 7.2 g/dL (ref 6.0–8.3)

## 2011-12-14 LAB — DRUG SCREEN PANEL (SERUM)

## 2011-12-15 ENCOUNTER — Other Ambulatory Visit (HOSPITAL_BASED_OUTPATIENT_CLINIC_OR_DEPARTMENT_OTHER): Payer: Medicare Other | Admitting: Lab

## 2011-12-15 ENCOUNTER — Ambulatory Visit (HOSPITAL_BASED_OUTPATIENT_CLINIC_OR_DEPARTMENT_OTHER): Payer: Medicare Other | Admitting: Physician Assistant

## 2011-12-15 DIAGNOSIS — I428 Other cardiomyopathies: Secondary | ICD-10-CM

## 2011-12-15 DIAGNOSIS — C50919 Malignant neoplasm of unspecified site of unspecified female breast: Secondary | ICD-10-CM

## 2011-12-15 DIAGNOSIS — I82629 Acute embolism and thrombosis of deep veins of unspecified upper extremity: Secondary | ICD-10-CM

## 2011-12-15 DIAGNOSIS — Z5181 Encounter for therapeutic drug level monitoring: Secondary | ICD-10-CM

## 2011-12-15 DIAGNOSIS — I82609 Acute embolism and thrombosis of unspecified veins of unspecified upper extremity: Secondary | ICD-10-CM

## 2011-12-15 DIAGNOSIS — Z7901 Long term (current) use of anticoagulants: Secondary | ICD-10-CM

## 2011-12-15 LAB — CBC WITH DIFFERENTIAL/PLATELET
BASO%: 0.8 % (ref 0.0–2.0)
Basophils Absolute: 0 10*3/uL (ref 0.0–0.1)
EOS%: 1.2 % (ref 0.0–7.0)
Eosinophils Absolute: 0.1 10*3/uL (ref 0.0–0.5)
HCT: 33.8 % — ABNORMAL LOW (ref 34.8–46.6)
HGB: 10.9 g/dL — ABNORMAL LOW (ref 11.6–15.9)
LYMPH%: 34.2 % (ref 14.0–49.7)
MCH: 27.2 pg (ref 25.1–34.0)
MCHC: 32.2 g/dL (ref 31.5–36.0)
MCV: 84.3 fL (ref 79.5–101.0)
MONO#: 0.1 10*3/uL (ref 0.1–0.9)
MONO%: 2.5 % (ref 0.0–14.0)
NEUT#: 3.2 10*3/uL (ref 1.5–6.5)
NEUT%: 61.3 % (ref 38.4–76.8)
Platelets: 199 10*3/uL (ref 145–400)
RBC: 4.01 10*6/uL (ref 3.70–5.45)
RDW: 17.4 % — ABNORMAL HIGH (ref 11.2–14.5)
WBC: 5.2 10*3/uL (ref 3.9–10.3)
lymph#: 1.8 10*3/uL (ref 0.9–3.3)
nRBC: 0 % (ref 0–0)

## 2011-12-15 LAB — PROTIME-INR
INR: 1 — ABNORMAL LOW (ref 2.00–3.50)
Protime: 12 Seconds (ref 10.6–13.4)

## 2011-12-15 NOTE — Progress Notes (Signed)
Hematology and Oncology Follow Up Visit  [name] [id number] 23-Jan-1956 56 y.o. 12/15/2011    HPI: Patient is a 56 year old [place] [name] with metastatic HER-2/neu positive breast carcinoma currently day 6 cycle 1 of every 2 week carboplatin/Gemzar given in combination with Herceptin, given in the palliative setting. 2. History of deep vein thrombosis of right upper extremity on Coumadin therapy.  Interim History:   This was to be seen today for nadir assessment following her first cycle of every 2 week palliative carboplatin/Gemzar given in combination with Herceptin. She did not wish to wait to be seen today, she states "feeling fine just tired". Labs were reviewed, she was noted to be subtherapeutic and her INR, she admits she does restart the Coumadin".   Current Outpatient Prescriptions  Medication Sig Dispense Refill  . ALPRAZolam (XANAX) 1 MG tablet Take 1 mg by mouth 3 (three) times daily as needed. For anxiety      . amLODipine (NORVASC) 10 MG tablet Take 10 mg by mouth daily.        . Ascorbic Acid (VITAMIN C) 100 MG tablet Take 100 mg by mouth daily.        . calcium citrate (CALCITRATE - DOSED IN MG ELEMENTAL CALCIUM) 950 MG tablet Take 1 tablet by mouth daily.        . calcium-vitamin D (OSCAL WITH D) 500-200 MG-UNIT per tablet Take 1 tablet by mouth daily.        . chlorpheniramine-HYDROcodone (TUSSIONEX PENNKINETIC ER) 10-8 MG/5ML LQCR Take 5 mLs by mouth every 12 (twelve) hours.  100 mL  0  . guaiFENesin (MUCINEX) 600 MG 12 hr tablet Take 1,200 mg by mouth 2 (two) times daily.        . hydrochlorothiazide (HYDRODIURIL) 25 MG tablet Take 25 mg by mouth See admin instructions. One tablet by mouth on Monday, Wednesday, and Friday       . ibuprofen (ADVIL,MOTRIN) 200 MG tablet Take 400 mg by mouth every 6 (six) hours as needed. For pain       . Multiple Vitamin (MULTIVITAMIN) capsule Take 1 capsule by mouth daily.        . ondansetron (ZOFRAN-ODT) 4 MG  disintegrating tablet Take 4 mg by mouth every 8 (eight) hours as needed. For nausea       . oxyCODONE-acetaminophen (PERCOCET) 5-325 MG per tablet Take 1 tablet by mouth every 4 (four) hours as needed. For pain      . Phenyleph-Doxylamine-DM-APAP (ALKA-SELTZER PLS NIGHT CLD/FLU PO) Take 2 tablets by mouth at bedtime.        Marland Kitchen warfarin (COUMADIN) 5 MG tablet Take 2 tablets (10 mg total) by mouth daily.  60 tablet  2    Allergies:  Allergies  Allergen Reactions  . Hydrocodone-Acetaminophen Itching and Rash    Physical Exam: Filed Vitals:   12/15/11 1139  BP: 120/78  Pulse: 91  Temp: 98.5 F (36.9 C)     Lab Results: Lab Results  Component Value Date   WBC 5.2 12/15/2011   HGB 10.9* 12/15/2011   HCT 33.8* 12/15/2011   MCV 84.3 12/15/2011   PLT 199 12/15/2011   NEUTROABS 3.2 12/15/2011     Chemistry      Component Value Date/Time   NA 139 12/09/2011 1118   K 3.4* 12/09/2011 1118   CL 99 12/09/2011 1118   CO2 30 12/09/2011 1118   BUN 12 12/09/2011 1118   CREATININE 0.86 12/09/2011 1118      Component  Value Date/Time   CALCIUM 9.2 12/09/2011 1118   ALKPHOS 85 12/09/2011 1118   AST 19 12/09/2011 1118   ALT 11 12/09/2011 1118   BILITOT 0.2* 12/09/2011 1118        Radiological Studies: Nm Pet Image Restag (ps) Skull Base To Thigh  12/02/2011  *RADIOLOGY REPORT*  Clinical Data:  Breast cancer. Subsequent treatment strategy.  NUCLEAR MEDICINE PET/CT  Technique:  15.2 mCi F-18 FDG was injected intravenously.  Full- ring PET imaging was performed from the skull base through the mid- thighs.  CT data was obtained and used for attenuation correction and anatomic localization only.  (This was not acquired as a diagnostic CT examination.)  Data regarding site of injection, patient weight, post injection waiting period, and fasting blood sugar is available in the EPIC documentation.  Comparison: 08/02/2011  Findings: The various lymph nodes and subcutaneous nodules in the right internal jugular chain, left  posterior cervical triangle, supraclavicular regions bilaterally, and tracking in the right axilla and subcutaneous tissues of the right chest are again noted and overall significantly increased in size and hypermetabolic activity.  For example, a subcutaneous nodule anterior to the right deltoid muscle measures 3.2 x 2.1 cm (formerly 1.9 x 1.3 cm) and has a maximum standard uptake value of 12.9 (formerly 5.6).  Left internal jugular adenopathy has a maximum standard uptake value of 9.0 (formerly 7.8) and subcutaneous nodularity posterior to the lower cervical spine has a maximum standard uptake value of 6.9 (formerly 3.0).  Left posterior cervical triangle node measuring 1.1 cm in short axis on image 41 has a maximum standard uptake value of 9.7 (formerly 4.1).  New hypermetabolic left supraclavicular adenopathy noted.  A subcutaneous nodule anterior to the sternum has a maximum standard uptake value of 5.6 (formerly 2.7).  No hypermetabolic activity suspicious for tumor is observed in the abdomen or pelvis.  Abnormal swelling of the right upper extremity may be due to lymphedema.  Bilateral inguinal lymph nodes are not hypermetabolic. No additional significant findings noted.  IMPRESSION:  1.  Increase in size and activity of the lymph nodes in the right internal jugular, left posterior cervical triangle, bilateral supraclavicular, right axillary, and right thoracic subcutaneous stations.  Right upper extremity subcutaneous edema suggests lymphedema.  Original Report Authenticated By: Dellia Cloud, [name]     Assessment:  Patient is a 56 year old [place] [name] with metastatic HER-2/neu positive breast carcinoma currently day 6 cycle 1 of every 2 week carboplatin/Gemzar given in combination with Herceptin, given in the palliative setting. 2. History of deep vein thrombosis of right upper extremity on Coumadin therapy, with subtherapeutic INR.  [name]   Plan:  Phoua's CBC actually looked quite good today. Therefore, I will see her back in 1 week's time essentially on 12/23/2011 for followup exam day 1 cycle 2 of palliative every 2 week carboplatin and Gemzar Herceptin. She was encouraged to continue to take her Coumadin is prior prescribed. She understands the importance behind this. She knows she can feel free to contact us prior to her one-week followup as the need should arise.  This plan was reviewed with the patient, who voices understanding and agreement.  She knows to call with any changes or problems.    Keshawn Sundberg T, PA-C 12/15/2011

## 2011-12-16 ENCOUNTER — Other Ambulatory Visit: Payer: Medicare Other | Admitting: Lab

## 2011-12-16 IMAGING — CT CT CHEST W/ CM
2 of 5 series · 17 of 46 positions shown, 19 images · IV contrast (omnipaque)
Comparison: Head CT same date, CT 04/15/2011

CT CHEST

CLINICAL DATA: Bilateral breast cancer.  Diagnosed 9999.
Bilateral mastectomy.  Chemotherapy ongoing.

CT CHEST, ABDOMEN AND PELVIS WITH CONTRAST
TECHNIQUE: Multidetector CT imaging of the chest, abdomen and
pelvis was performed following the standard protocol during bolus
administration of intravenous contrast.
Contrast: 100 ml Omnipaque 300

[Series 2: cap with st · axial · 0.81mm/px · z∈[-566,-46]mm · 14 of 118 slices shown, 16 images]
[im 7/118  soft-tissue]
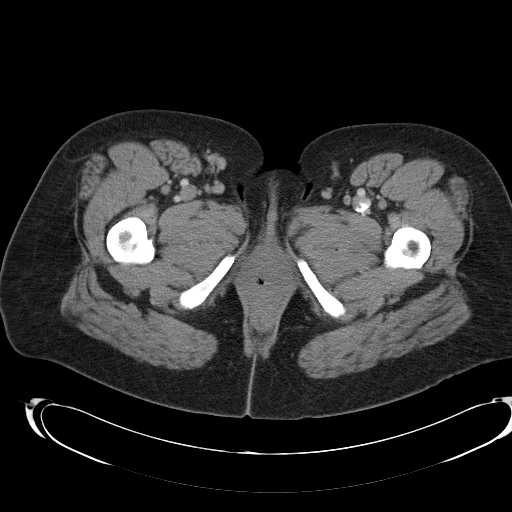
[im 7/118  bone]
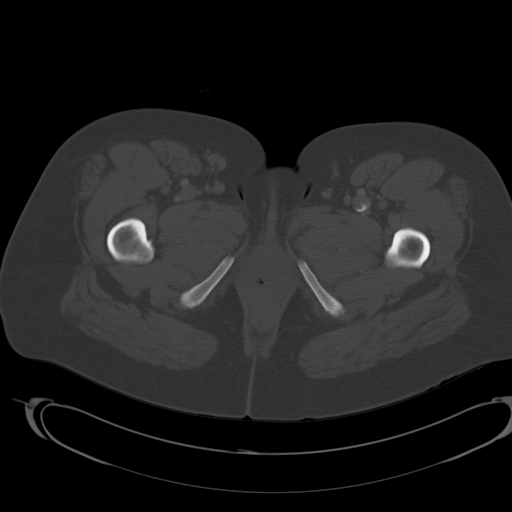
[im 14/118  soft-tissue]
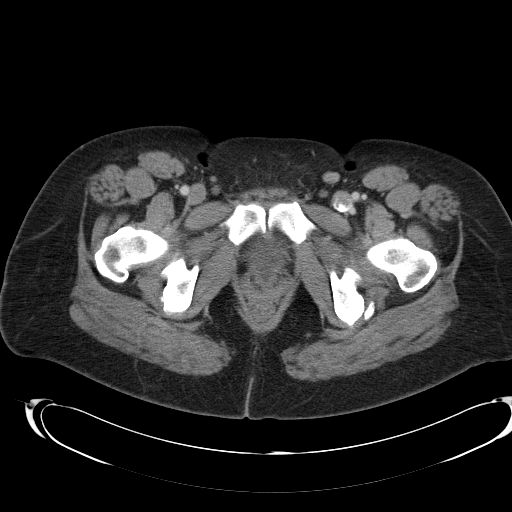
[im 27/118  soft-tissue]
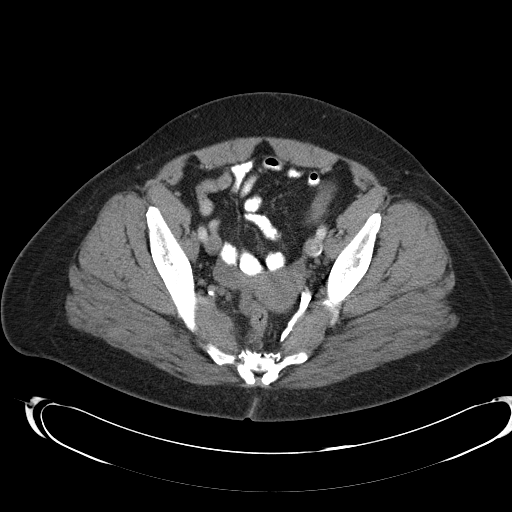
[im 33/118  soft-tissue]
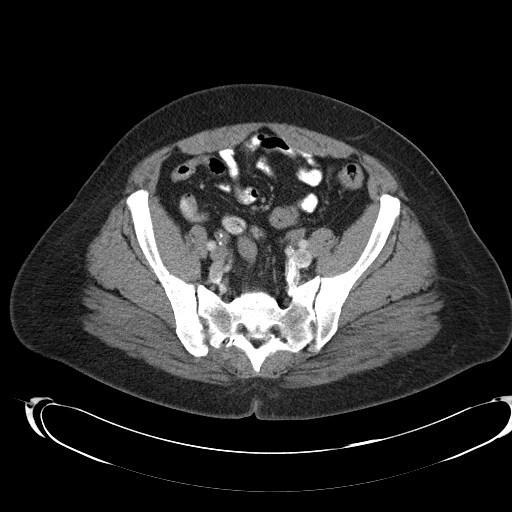
[im 40/118  soft-tissue]
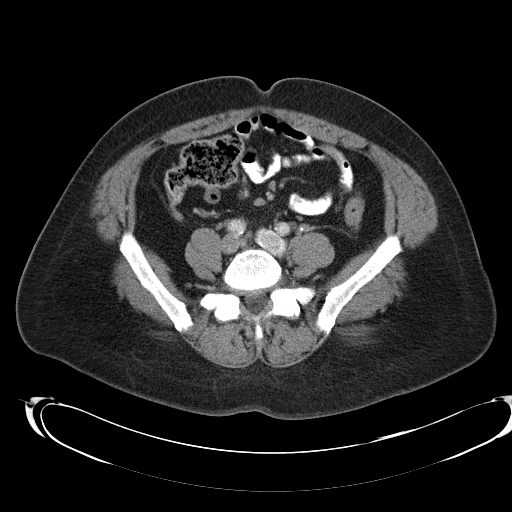
[im 46/118  soft-tissue]
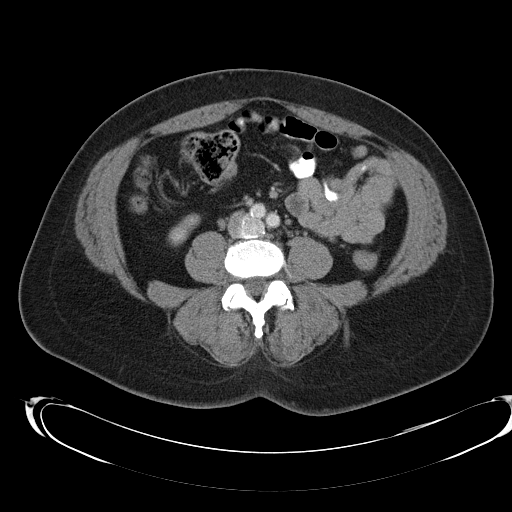
[im 53/118  soft-tissue]
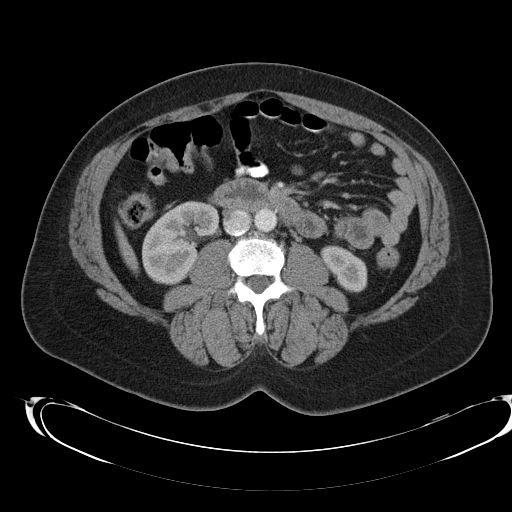
[im 66/118  soft-tissue]
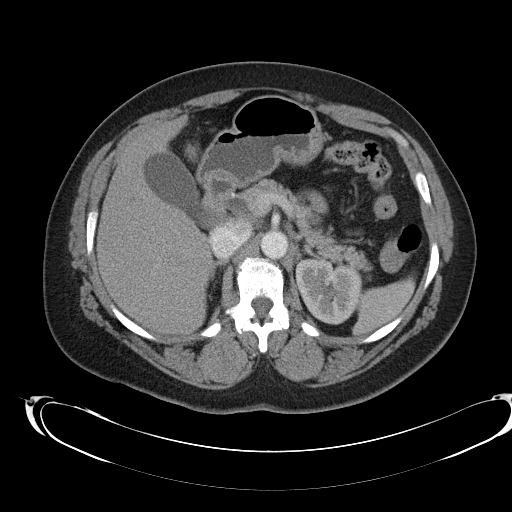
[im 72/118  soft-tissue]
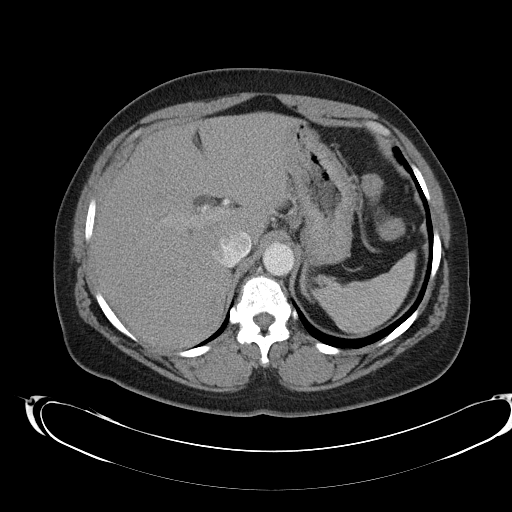
[im 72/118  bone]
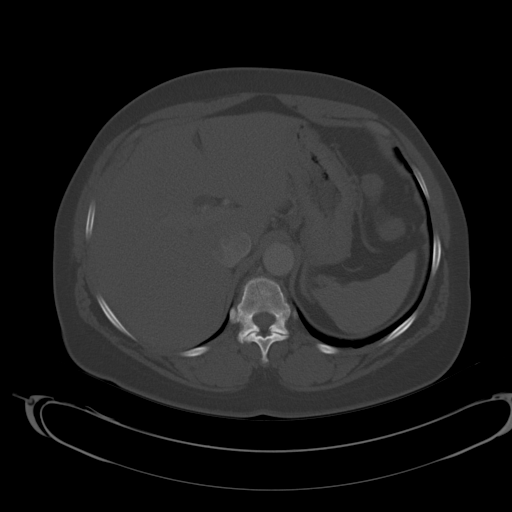
[im 79/118  soft-tissue]
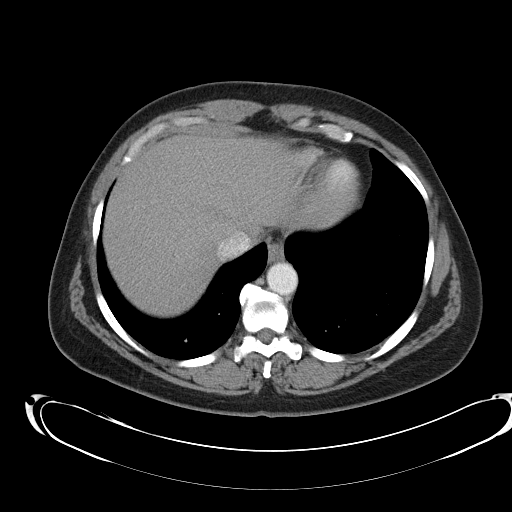
[im 85/118  soft-tissue]
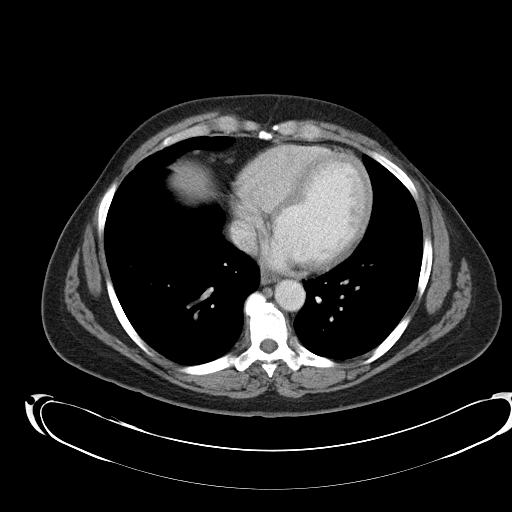
[im 92/118  soft-tissue]
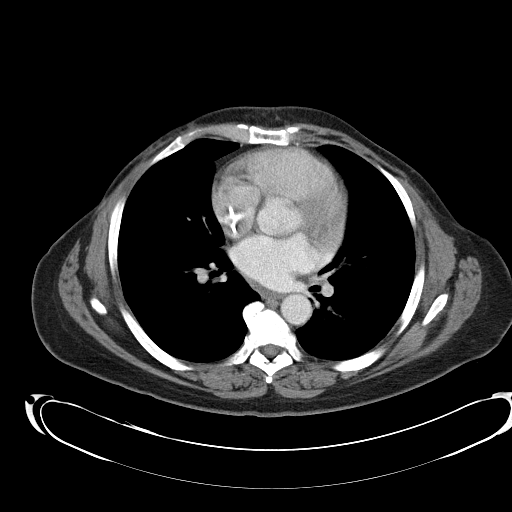
[im 105/118  soft-tissue]
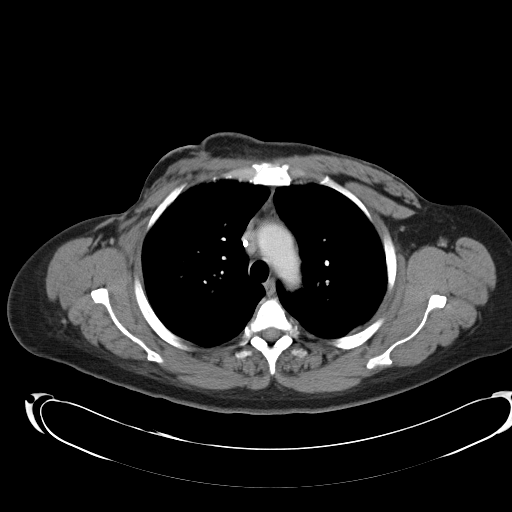
[im 111/118  soft-tissue]
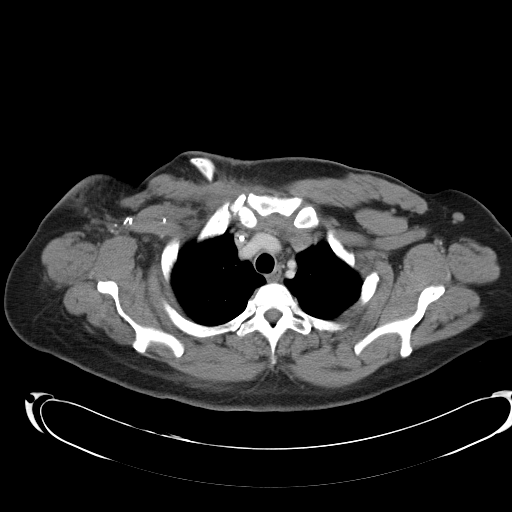

[Series 602: <mpr thick range> · coronal · 1.15mm/px · 3 of 82 slices shown]
[im 28/82  soft-tissue]
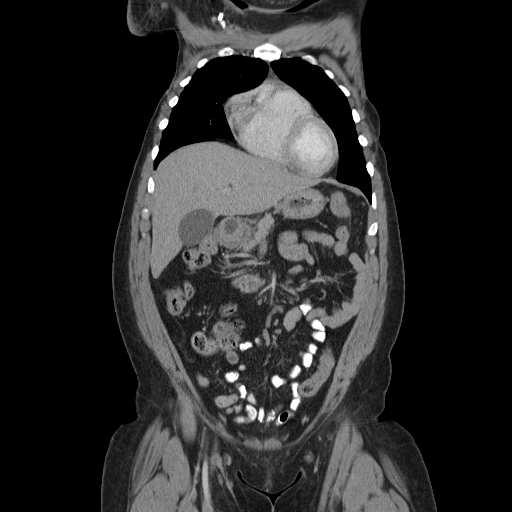
[im 37/82  soft-tissue]
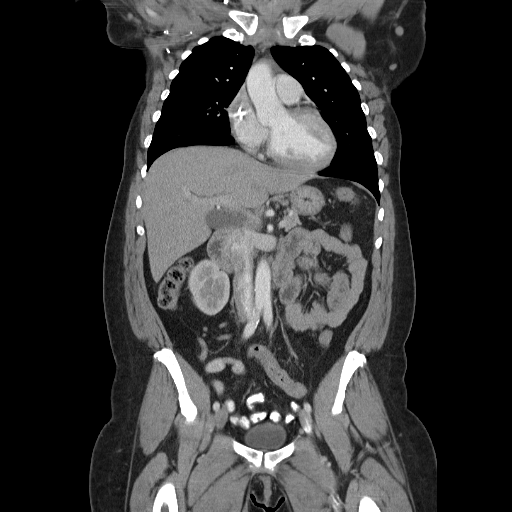
[im 46/82  soft-tissue]
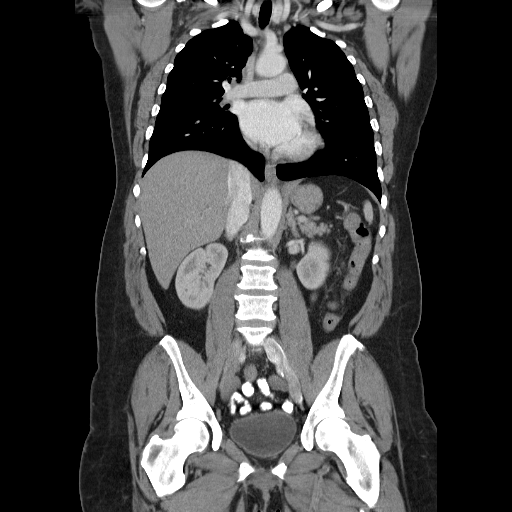

[17 of 46 positions shown; findings below may reference images not displayed]

FINDINGS: Bilateral mastectomies.  There is a port in the high
right anterior chest wall. Small nodule into the sternum measures 7
mm nodule which is slightly increased from prior. Small 9 mm left
supraclavicular lymph node which is not seen on prior.  Of note
there are hypermetabolic nodes superior to the level of this scan
on comparison PET CT scan.

No mediastinal or hilar lymphadenopathy.  There is thickening in
the prevascular space which is similar to prior.

No suspicious pulmonary nodules.
IMPRESSION: Mild increase in size of chest wall lymph nodes and nodules.  More
extensive disease is demonstrated on comparison FDG PET CT scan.

CT ABDOMEN AND PELVIS
FINDINGS: A tiny hypodensity in the left hepatic lobe (image 45)
is unchanged.  The gallbladder, pancreas, spleen, adrenal glands,
kidneys are normal.

The stomach, small bowel, appendix, and colon are normal.

Abdominal aorta normal caliber.  No retroperitoneal periportal
lymphadenopathy.

No free fluid the pelvis.  The bladder and uterus are normal.  No
pelvic lymphadenopathy.  Review of  bone windows demonstrates no
aggressive osseous lesions.
IMPRESSION: No evidence metastasis within the abdomen or pelvis.

## 2011-12-16 IMAGING — PT NM PET TUM IMG RESTAG (PS) SKULL BASE T - THIGH
6 series · 25 of 25 positions shown · non-contrast
Comparison: PET CT [DATE]

CLINICAL DATA: Subsequent treatment strategy for breast cancer.
Initial diagnosis of 4886.  Chemotherapy ongoing.

NUCLEAR MEDICINE PET SKULL BASE TO THIGH
Fasting Blood Glucose:  122
TECHNIQUE: 19.0 mCi F-18 FDG was injected intravenously via the
left foot.  Full-ring PET imaging was performed from the skull base
through the mid-thighs 65  minutes after injection.  CT data was
obtained and used for attenuation correction and anatomic
localization only.  (This was not acquired as a diagnostic CT
examination.)

[Series 1: pet ac · axial · 3.3mm · 4.69mm/px · z∈[-879,-9]mm · 5 of 267 slices shown]
[im 1/267]
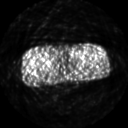
[im 67/267]
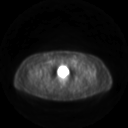
[im 134/267]
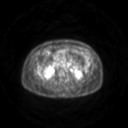
[im 200/267]
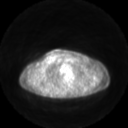
[im 267/267]
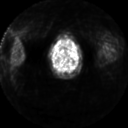

[Series 2: ct images · axial · 3.8mm · 0.98mm/px · z∈[-879,-10]mm · 5 of 267 slices shown]
[im 1/267]
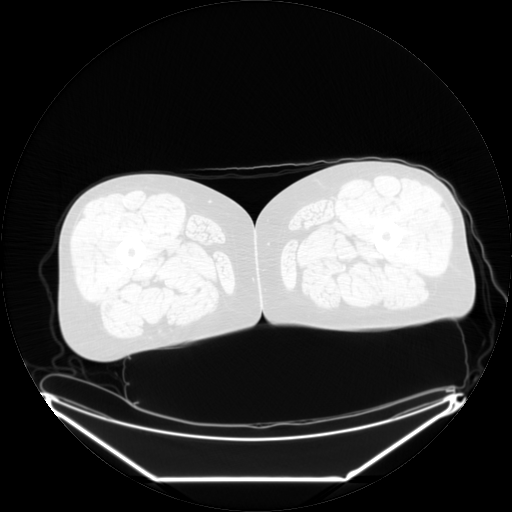
[im 67/267]
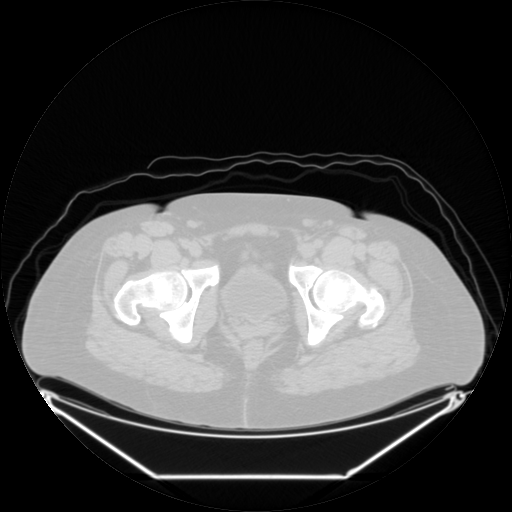
[im 134/267]
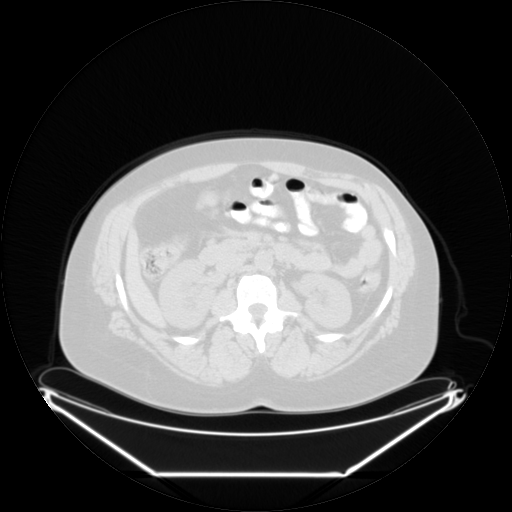
[im 200/267]
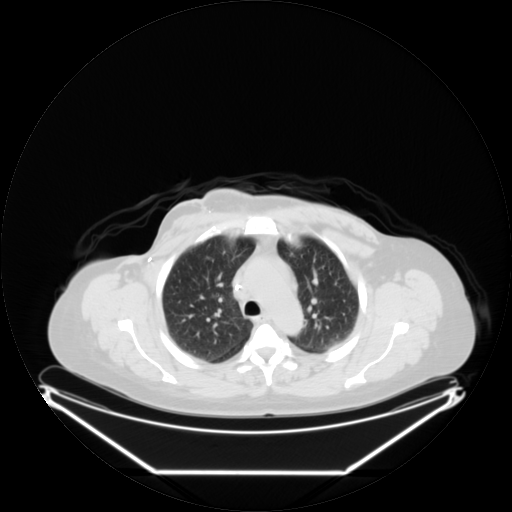
[im 267/267  brain]
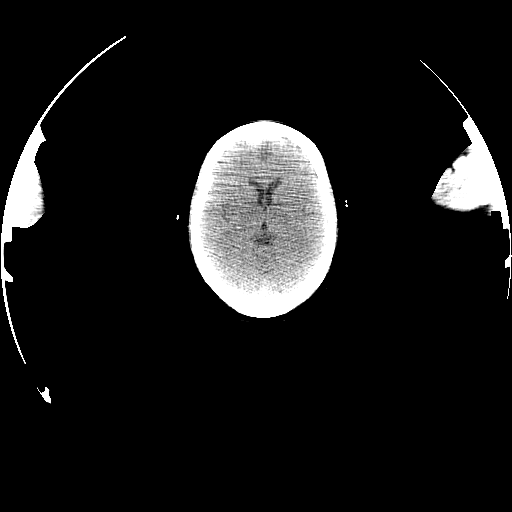

[Series 2: pet nac · axial · 3.3mm · 4.69mm/px · z∈[-879,-9]mm · 6 of 267 slices shown]
[im 1/267]
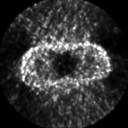
[im 54/267]
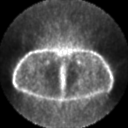
[im 107/267]
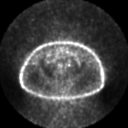
[im 160/267]
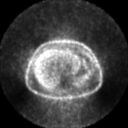
[im 213/267]
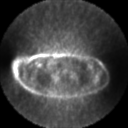
[im 267/267]
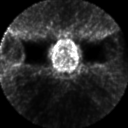

[Series 123: mip · coronal · 3.3mm · 4.69mm/px · 1 of 30 slices shown]
[im 1/30]
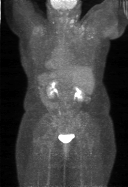

[Series 151: reformatted · axial · 3.3mm · 3.91mm/px · z∈[-879,-9]mm · 6 of 265 slices shown (1 of 2)]
[im 1/265]
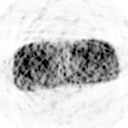
[im 53/265]
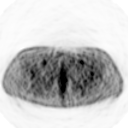
[im 106/265]
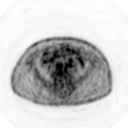
[im 159/265]
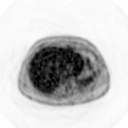
[im 212/265]
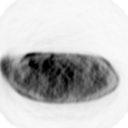
[im 265/265]
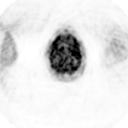

[Series 153: reformatted · coronal · 4.7mm · 6.98mm/px · 2 of 71 slices shown (2 of 2)]
[im 1/71]
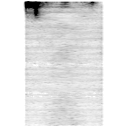
[im 71/71]
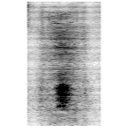

[25 of 25 positions shown; findings below may reference images not displayed]

FINDINGS: Neck:There is a cluster hypermetabolic lymph nodes within the right
level II neck posterior to the sternocleidomastoid.  These nodes
are small but intense hypermetabolic.  For example 15 mm short axis
node (image 30) with SUV max = 7.82).  There is hypermetabolic
right level III lymph node (image 33) which measures 7 mm short
axis with SUV max = 4.1.  There is hypermetabolic supraclavicular
lymph node along the left which is small measuring 6 mm (image 46)
with SUV max = 3.7. These lymph nodes are new from prior.

Chest:There are hypermetabolic lymph nodes and nodules within the
right chest wall.  For example nodule anterior to the sternum
measures 8 mm with SUV max = 3.5 (image 64).  There is a
hypermetabolic right axillary lymph node (image 54) with SUV max =
5.1. There is small nodule in the most superior right chest
anteriorly measuring 15 mm (image 42) with SUV max = 5.6. There is
additional hypermetabolic lymph node within the subcutaneous high
back superficial to the musculature (image 38).

No hypermetabolic mediastinal lymph nodes.  No suspicious pulmonary
nodules.

Abdomen / Pelvis:No abnormal hypermetabolic activity within the
solid organs.  No evidence of abdominal or pelvic hypermetabolic
nodes. Mild metabolic active associate inguinal lymph nodes.

Adjacent to the ascending colon there is a 6 mm small nodule (159)
with SUV max = 2.3.  This is not clearly seen on prior.

Skeleton:No focal hypermetabolic activity to suggest skeletal
metastasis.
IMPRESSION: 1.  Evidence of local breast cancer recurrence with new metabolic
nodules within the right chest, axilla and back.
2.  Evidence for nodal metastasis within the left and right neck.
3.  Hypermetabolic nodule  adjacent to the ascending colon is
indeterminate.  Recommend attention on follow-up.

## 2011-12-17 ENCOUNTER — Emergency Department (HOSPITAL_COMMUNITY)
Admission: EM | Admit: 2011-12-17 | Discharge: 2011-12-17 | Disposition: A | Discharge status: Home / Self Care | Payer: Medicare Other | Attending: Emergency Medicine | Admitting: Emergency Medicine

## 2011-12-17 ENCOUNTER — Encounter (HOSPITAL_COMMUNITY): Payer: Self-pay | Admitting: Emergency Medicine

## 2011-12-17 DIAGNOSIS — M7989 Other specified soft tissue disorders: Secondary | ICD-10-CM | POA: Insufficient documentation

## 2011-12-17 DIAGNOSIS — Z7901 Long term (current) use of anticoagulants: Secondary | ICD-10-CM | POA: Insufficient documentation

## 2011-12-17 DIAGNOSIS — Z79899 Other long term (current) drug therapy: Secondary | ICD-10-CM | POA: Insufficient documentation

## 2011-12-17 DIAGNOSIS — Z86718 Personal history of other venous thrombosis and embolism: Secondary | ICD-10-CM | POA: Insufficient documentation

## 2011-12-17 DIAGNOSIS — I890 Lymphedema, not elsewhere classified: Secondary | ICD-10-CM

## 2011-12-17 DIAGNOSIS — R609 Edema, unspecified: Secondary | ICD-10-CM | POA: Insufficient documentation

## 2011-12-17 DIAGNOSIS — Z9889 Other specified postprocedural states: Secondary | ICD-10-CM | POA: Insufficient documentation

## 2011-12-17 DIAGNOSIS — Z859 Personal history of malignant neoplasm, unspecified: Secondary | ICD-10-CM | POA: Insufficient documentation

## 2011-12-17 MED ORDER — OXYCODONE-ACETAMINOPHEN 5-325 MG PO TABS
2.0000 | ORAL_TABLET | Freq: Once | ORAL | Status: AC
  Administered 2011-12-17: 2 via ORAL
  Filled 2011-12-17: qty 2

## 2011-12-17 MED ORDER — OXYCODONE-ACETAMINOPHEN 10-325 MG PO TABS: 1.0000 | ORAL_TABLET | ORAL | Status: AC | PRN

## 2011-12-17 NOTE — ED Provider Notes (Signed)
History     CSN: [id number]  Arrival date & time 12/17/11  1736   First MD Initiated Contact with Patient 12/17/11 1801      Chief Complaint  Patient presents with  . Arm Swelling    (Consider location/radiation/quality/duration/timing/severity/associated sxs/prior treatment) The history is provided by the patient.  Pt has chronic history of RUE lymphedema after bl mastectomies. Pt recently ran out of her pain medication and is here for refill until she see her PMD. Pt is on coumadin for prev DVT and her last INR was 2.5 per her report. She does not want further investigation. She refuses blood work or US of the arm. She denies CP, SOB, fever or any other concerns  Past Medical History  Diagnosis Date  . DVT (deep venous thrombosis) 10/07/2011  . Cancer     Past Surgical History  Procedure Date  . Mastectomy     bilateral    History reviewed. No pertinent family history.  History  Substance Use Topics  . Smoking status: Never Smoker   . Smokeless tobacco: Not on file  . Alcohol Use: No    OB History    Grav Para Term Preterm Abortions TAB SAB Ect Mult Living                  Review of Systems  Constitutional: Negative for fever and chills.  Respiratory: Negative for cough, shortness of breath and wheezing.   Cardiovascular: Negative for chest pain and leg swelling.  Gastrointestinal: Negative for nausea, vomiting and abdominal pain.  Skin: Negative for color change and rash.  Neurological: Negative for weakness and numbness.    Allergies  Hydrocodone-acetaminophen  Home Medications   Current Outpatient Rx  Name Route Sig Dispense Refill  . ALPRAZOLAM 1 MG PO TABS Oral Take 1 mg by mouth 3 (three) times daily as needed. For anxiety    . AMLODIPINE BESYLATE 10 MG PO TABS Oral Take 10 mg by mouth daily.      Marland Kitchen VITAMIN C 100 MG PO TABS Oral Take 100 mg by mouth daily.      Marland Kitchen CALCIUM CARBONATE-VITAMIN D 500-200 MG-UNIT PO TABS Oral Take 1 tablet by mouth  daily.      Marland Kitchen HYDROCOD POLST-CPM POLST ER 10-8 MG/5ML PO LQCR Oral Take 5 mLs by mouth every 12 (twelve) hours as needed. Cold symptons    . HYDROCHLOROTHIAZIDE 25 MG PO TABS Oral Take 25 mg by mouth See admin instructions. One tablet by mouth on Monday, Wednesday, and Friday     . IBUPROFEN 200 MG PO TABS Oral Take 400 mg by mouth every 6 (six) hours as needed. For pain     . MULTIVITAMINS PO CAPS Oral Take 1 capsule by mouth daily.      Marland Kitchen ONDANSETRON 4 MG PO TBDP Oral Take 4 mg by mouth every 8 (eight) hours as needed. For nausea     . OXYCODONE-ACETAMINOPHEN 5-325 MG PO TABS Oral Take 1 tablet by mouth every 4 (four) hours as needed. For pain    . WARFARIN SODIUM 5 MG PO TABS Oral Take 2 tablets (10 mg total) by mouth daily. 60 tablet 2    OR AS DIRECTED  . OXYCODONE-ACETAMINOPHEN 10-325 MG PO TABS Oral Take 1 tablet by mouth every 4 (four) hours as needed for pain. 15 tablet 0    BP 127/81  Pulse 86  Temp(Src) 97.7 F (36.5 C) (Axillary)  Resp 18  SpO2 99%  Physical Exam  Nursing note and vitals reviewed. Constitutional: She is oriented to person, place, and time. She appears well-developed and well-nourished. No distress.  HENT:  Head: Normocephalic and atraumatic.  Mouth/Throat: Oropharynx is clear and moist.  Eyes: EOM are normal. Pupils are equal, round, and reactive to light.  Neck: Normal range of motion. Neck supple.  Cardiovascular: Normal rate and regular rhythm.   Pulmonary/Chest: Effort normal and breath sounds normal. No respiratory distress. She has no wheezes. She has no rales.  Abdominal: Soft. Bowel sounds are normal. There is no tenderness. There is no rebound and no guarding.  Musculoskeletal: Normal range of motion. She exhibits edema. She exhibits no tenderness.       Pt with edematous RUE. 2+ radial pulses. No pitting edema. No evidence of cellulitis  Neurological: She is alert and oriented to person, place, and time.  Skin: Skin is warm and dry. No rash  noted. No erythema.  Psychiatric: She has a normal mood and affect. Her behavior is normal.    ED Course  Procedures (including critical care time)  Labs Reviewed - No data to display No results found.   1. Lymphedema of arm       MDM    Given chronic nature of patient's lymphedema and pt refusal for further testing, will d/c home with PO pain meds to f/u with her PMD. Return for concerns        Loren Racer, MD 12/17/11 [phone]

## 2011-12-17 NOTE — ED Notes (Signed)
Pt c/o swelling to right arm since last night. Pt has had bilateral mastectomy, right breast in 2003, left breast in 2006.  Pt has been wearing a compression wrap on right arm since swelling began.

## 2011-12-17 NOTE — ED Notes (Signed)
Pharmacy tech in room  

## 2011-12-22 ENCOUNTER — Telehealth: Payer: Self-pay | Admitting: *Deleted

## 2011-12-22 ENCOUNTER — Other Ambulatory Visit: Payer: Self-pay | Admitting: Physician Assistant

## 2011-12-22 DIAGNOSIS — I82409 Acute embolism and thrombosis of unspecified deep veins of unspecified lower extremity: Secondary | ICD-10-CM

## 2011-12-22 DIAGNOSIS — C50919 Malignant neoplasm of unspecified site of unspecified female breast: Secondary | ICD-10-CM

## 2011-12-23 ENCOUNTER — Ambulatory Visit: Payer: Medicare Other

## 2011-12-23 ENCOUNTER — Ambulatory Visit (HOSPITAL_BASED_OUTPATIENT_CLINIC_OR_DEPARTMENT_OTHER): Payer: Medicare Other | Admitting: Physician Assistant

## 2011-12-23 ENCOUNTER — Other Ambulatory Visit: Payer: Medicare Other | Admitting: Lab

## 2011-12-23 VITALS — BP 122/79 | HR 90 | Temp 98.6°F | Ht 64.5 in | Wt 204.3 lb

## 2011-12-23 DIAGNOSIS — I82409 Acute embolism and thrombosis of unspecified deep veins of unspecified lower extremity: Secondary | ICD-10-CM

## 2011-12-23 DIAGNOSIS — C50919 Malignant neoplasm of unspecified site of unspecified female breast: Secondary | ICD-10-CM

## 2011-12-23 DIAGNOSIS — C801 Malignant (primary) neoplasm, unspecified: Secondary | ICD-10-CM

## 2011-12-23 DIAGNOSIS — Z86718 Personal history of other venous thrombosis and embolism: Secondary | ICD-10-CM

## 2011-12-23 DIAGNOSIS — I890 Lymphedema, not elsewhere classified: Secondary | ICD-10-CM

## 2011-12-23 LAB — CBC WITH DIFFERENTIAL/PLATELET
BASO%: 0.2 % (ref 0.0–2.0)
Basophils Absolute: 0 10*3/uL (ref 0.0–0.1)
EOS%: 0.7 % (ref 0.0–7.0)
Eosinophils Absolute: 0.1 10*3/uL (ref 0.0–0.5)
HCT: 37.7 % (ref 34.8–46.6)
HGB: 12.6 g/dL (ref 11.6–15.9)
LYMPH%: 16.4 % (ref 14.0–49.7)
MCH: 27.1 pg (ref 25.1–34.0)
MCHC: 33.4 g/dL (ref 31.5–36.0)
MCV: 81.1 fL (ref 79.5–101.0)
MONO#: 0.5 10*3/uL (ref 0.1–0.9)
MONO%: 5.8 % (ref 0.0–14.0)
NEUT#: 6.4 10*3/uL (ref 1.5–6.5)
NEUT%: 76.9 % — ABNORMAL HIGH (ref 38.4–76.8)
Platelets: 309 10*3/uL (ref 145–400)
RBC: 4.65 10*6/uL (ref 3.70–5.45)
RDW: 18.1 % — ABNORMAL HIGH (ref 11.2–14.5)
WBC: 8.3 10*3/uL (ref 3.9–10.3)
lymph#: 1.4 10*3/uL (ref 0.9–3.3)

## 2011-12-23 LAB — PROTIME-INR
INR: 1.8 — ABNORMAL LOW (ref 2.00–3.50)
Protime: 21.6 Seconds — ABNORMAL HIGH (ref 10.6–13.4)

## 2011-12-23 NOTE — Progress Notes (Signed)
Hematology and Oncology Follow Up Visit  [name] [id number] Apr 22, 1956 56 y.o. 12/23/2011    HPI: Patient is a 56 year old [place] woman with metastatic HER-2/neu positive breast carcinoma. She is due for day 1 cycle 2 of every 2 week carboplatin/Gemzar given in combination with Herceptin in the palliative setting.] 2. History of deep vein thrombosis of the right upper extremity with associated significant lymphedema on Coumadin therapy.  Interim History:   [name] is seen today in anticipation of day 1 cycle 2 of every 2 week carboplatin/Gemzar given in combination with Herceptin in the palliative setting. She states that she is feeling well, but does not wish for treatment today. She is aware that she needs a 2-D echocardiogram and wishes to get this accomplished before restarting treatment. She denies any complaints today, no fevers chills no shortness of breath productive cough or chest pain, she is obviously wheezing though. She denies nausea emesis diarrhea or constipation. No bleeding or bruising symptoms. She continues to have issues with right arm lymphedema.  A detailed review of systems is otherwise noncontributory as noted below.  Review of Systems: Constitutional:  no weight loss, fever, night sweats and fatigued Eyes: no complaints ENT: no complaints Cardiovascular: no chest pain or dyspnea on exertion Respiratory: no cough, shortness of breath, or wheezing Neurological: no TIA or stroke symptoms Dermatological: positive for lumps Gastrointestinal: no abdominal pain, change in bowel habits, or black or bloody stools Genito-Urinary: no dysuria, trouble voiding, or hematuria Hematological and Lymphatic: negative Breast: negative Musculoskeletal: negative Remaining ROS negative.   Medications:   I have reviewed the patient's current medications.  Current Outpatient Prescriptions  Medication Sig Dispense Refill  . ALPRAZolam (XANAX) 1 MG tablet  Take 1 mg by mouth 3 (three) times daily as needed. For anxiety      . amLODipine (NORVASC) 10 MG tablet Take 10 mg by mouth daily.        . Ascorbic Acid (VITAMIN C) 100 MG tablet Take 100 mg by mouth daily.        . calcium-vitamin D (OSCAL WITH D) 500-200 MG-UNIT per tablet Take 1 tablet by mouth daily.        . chlorpheniramine-HYDROcodone (TUSSIONEX) 10-8 MG/5ML LQCR Take 5 mLs by mouth every 12 (twelve) hours as needed. Cold symptons      . hydrochlorothiazide (HYDRODIURIL) 25 MG tablet Take 25 mg by mouth See admin instructions. One tablet by mouth on Monday, Wednesday, and Friday       . ibuprofen (ADVIL,MOTRIN) 200 MG tablet Take 400 mg by mouth every 6 (six) hours as needed. For pain       . Multiple Vitamin (MULTIVITAMIN) capsule Take 1 capsule by mouth daily.        . ondansetron (ZOFRAN-ODT) 4 MG disintegrating tablet Take 4 mg by mouth every 8 (eight) hours as needed. For nausea       . oxyCODONE-acetaminophen (PERCOCET) 10-325 MG per tablet Take 1 tablet by mouth every 4 (four) hours as needed for pain.  15 tablet  0  . oxyCODONE-acetaminophen (PERCOCET) 5-325 MG per tablet Take 1 tablet by mouth every 4 (four) hours as needed. For pain      . warfarin (COUMADIN) 5 MG tablet Take 2 tablets (10 mg total) by mouth daily.  60 tablet  2    Allergies:  Allergies  Allergen Reactions  . Hydrocodone-Acetaminophen Itching and Rash    Physical Exam: Filed Vitals:   12/23/11 1149  BP: 122/79  Pulse:  90  Temp: 98.6 F (37 C)   HEENT:  Sclerae anicteric, conjunctivae pink.  Oropharynx clear.  No mucositis or candidiasis.   Nodes: Obvious palpable lymphadenopathy in the right supraclavicular region along with subcutaneous nodules noted over the upper right anterior chest wall extending to the upper back region. Breast Exam: Deferred.   Lungs:  Patient is urinating well, but she does appear to have evidence of wheezing with deep inspiration. It does not frankly clear with  cough. Heart:  Regular rate and rhythm.   Abdomen:  Soft, nontender.  Positive bowel sounds.  No organomegaly or masses palpated.   Musculoskeletal:  No focal spinal tenderness to palpation.  Extremities: Obvious lymphedema of the right upper extremity, compression garment in place.   Skin:  Benign.   Neuro:  Nonfocal, essentially alert and oriented though patient appears distracted.  Lab Results: Lab Results  Component Value Date   WBC 8.3 12/23/2011   HGB 12.6 12/23/2011   HCT 37.7 12/23/2011   MCV 81.1 12/23/2011   PLT 309 12/23/2011   NEUTROABS 6.4 12/23/2011     Chemistry      Component Value Date/Time   NA 139 12/09/2011 1118   K 3.4* 12/09/2011 1118   CL 99 12/09/2011 1118   CO2 30 12/09/2011 1118   BUN 12 12/09/2011 1118   CREATININE 0.86 12/09/2011 1118      Component Value Date/Time   CALCIUM 9.2 12/09/2011 1118   ALKPHOS 85 12/09/2011 1118   AST 19 12/09/2011 1118   ALT 11 12/09/2011 1118   BILITOT 0.2* 12/09/2011 1118          Assessment:  Patient is a 56 year old [place] woman with metastatic HER-2/neu positive breast carcinoma. She is due for day 1 cycle 2 of every 2 week carboplatin/Gemzar given in combination with Herceptin in the palliative setting.] 2. History of deep vein thrombosis of the right upper extremity with associated significant lymphedema on Coumadin therapy.  Case reviewed with Dr. Pierce Crane.  Plan:  We will defer day 1 cycle 2 of every 2 week carboplatin/Gemzar/Herceptin today. Patient will be scheduled for a 2-D echocardiogram. She'll return in one week's time for a followup exam including going over her echocardiogram prior to cycle 2 been resumed. She'll continue on her current dose of Coumadin at 5 mg per day. She knows to contact us prior to her week followup if the need should arise.   This plan was reviewed with the patient, who voices understanding and agreement.  She knows to call with any changes or problems.    Lyniah Fujita  T, PA-C 12/23/2011

## 2011-12-23 NOTE — Telephone Encounter (Signed)
Patient called to confirm the date and time

## 2011-12-26 ENCOUNTER — Ambulatory Visit: Payer: Medicare Other | Admitting: Physician Assistant

## 2011-12-29 ENCOUNTER — Other Ambulatory Visit (HOSPITAL_COMMUNITY): Payer: Medicare Other

## 2011-12-30 ENCOUNTER — Other Ambulatory Visit: Payer: Medicare Other | Admitting: Lab

## 2011-12-30 ENCOUNTER — Ambulatory Visit: Payer: Medicare Other | Admitting: Physician Assistant

## 2011-12-30 ENCOUNTER — Ambulatory Visit: Payer: Medicare Other

## 2012-01-06 ENCOUNTER — Other Ambulatory Visit: Payer: Medicare Other | Admitting: Lab

## 2012-01-06 ENCOUNTER — Telehealth: Payer: Self-pay | Admitting: Oncology

## 2012-01-06 ENCOUNTER — Ambulatory Visit: Payer: Medicare Other | Admitting: Physician Assistant

## 2012-01-06 ENCOUNTER — Ambulatory Visit: Payer: Medicare Other

## 2012-01-06 NOTE — Telephone Encounter (Signed)
S/w the pt regarding her echo appt on 01/19/2012@10 :00am at [facility]

## 2012-01-19 ENCOUNTER — Ambulatory Visit (HOSPITAL_COMMUNITY)
Admission: RE | Admit: 2012-01-19 | Discharge: 2012-01-19 | Disposition: A | Discharge status: Home / Self Care | Payer: Medicare Other | Source: Ambulatory Visit | Attending: Oncology | Admitting: Oncology

## 2012-01-19 DIAGNOSIS — I359 Nonrheumatic aortic valve disorder, unspecified: Secondary | ICD-10-CM | POA: Insufficient documentation

## 2012-01-19 DIAGNOSIS — I079 Rheumatic tricuspid valve disease, unspecified: Secondary | ICD-10-CM | POA: Insufficient documentation

## 2012-01-19 DIAGNOSIS — Z09 Encounter for follow-up examination after completed treatment for conditions other than malignant neoplasm: Secondary | ICD-10-CM | POA: Insufficient documentation

## 2012-01-19 DIAGNOSIS — C50919 Malignant neoplasm of unspecified site of unspecified female breast: Secondary | ICD-10-CM

## 2012-01-19 NOTE — Progress Notes (Signed)
  Echocardiogram 2D Echocardiogram has been performed.  [name] A 01/19/2012, 10:54 AM

## 2012-01-20 ENCOUNTER — Ambulatory Visit (HOSPITAL_BASED_OUTPATIENT_CLINIC_OR_DEPARTMENT_OTHER): Payer: Medicare Other | Admitting: Physician Assistant

## 2012-01-20 ENCOUNTER — Telehealth: Payer: Self-pay | Admitting: *Deleted

## 2012-01-20 ENCOUNTER — Other Ambulatory Visit (HOSPITAL_BASED_OUTPATIENT_CLINIC_OR_DEPARTMENT_OTHER): Payer: Medicare Other | Admitting: Lab

## 2012-01-20 ENCOUNTER — Ambulatory Visit (HOSPITAL_BASED_OUTPATIENT_CLINIC_OR_DEPARTMENT_OTHER): Payer: Medicare Other

## 2012-01-20 VITALS — BP 106/70 | HR 97 | Temp 97.6°F | Ht 64.5 in | Wt 204.0 lb

## 2012-01-20 DIAGNOSIS — C50919 Malignant neoplasm of unspecified site of unspecified female breast: Secondary | ICD-10-CM

## 2012-01-20 DIAGNOSIS — C50219 Malignant neoplasm of upper-inner quadrant of unspecified female breast: Secondary | ICD-10-CM

## 2012-01-20 DIAGNOSIS — Z86718 Personal history of other venous thrombosis and embolism: Secondary | ICD-10-CM

## 2012-01-20 DIAGNOSIS — Z5181 Encounter for therapeutic drug level monitoring: Secondary | ICD-10-CM

## 2012-01-20 DIAGNOSIS — E8989 Other postprocedural endocrine and metabolic complications and disorders: Secondary | ICD-10-CM

## 2012-01-20 DIAGNOSIS — Z5111 Encounter for antineoplastic chemotherapy: Secondary | ICD-10-CM

## 2012-01-20 DIAGNOSIS — I82409 Acute embolism and thrombosis of unspecified deep veins of unspecified lower extremity: Secondary | ICD-10-CM

## 2012-01-20 DIAGNOSIS — Z7901 Long term (current) use of anticoagulants: Secondary | ICD-10-CM

## 2012-01-20 DIAGNOSIS — I890 Lymphedema, not elsewhere classified: Secondary | ICD-10-CM

## 2012-01-20 LAB — CBC WITH DIFFERENTIAL/PLATELET
BASO%: 0.6 % (ref 0.0–2.0)
Basophils Absolute: 0.1 10*3/uL (ref 0.0–0.1)
EOS%: 3.4 % (ref 0.0–7.0)
Eosinophils Absolute: 0.3 10*3/uL (ref 0.0–0.5)
HCT: 37.9 % (ref 34.8–46.6)
HGB: 12.4 g/dL (ref 11.6–15.9)
LYMPH%: 20.4 % (ref 14.0–49.7)
MCH: 27.1 pg (ref 25.1–34.0)
MCHC: 32.7 g/dL (ref 31.5–36.0)
MCV: 82.9 fL (ref 79.5–101.0)
MONO#: 0.6 10*3/uL (ref 0.1–0.9)
MONO%: 7.9 % (ref 0.0–14.0)
NEUT#: 5.3 10*3/uL (ref 1.5–6.5)
NEUT%: 67.7 % (ref 38.4–76.8)
Platelets: 278 10*3/uL (ref 145–400)
RBC: 4.57 10*6/uL (ref 3.70–5.45)
RDW: 17.7 % — ABNORMAL HIGH (ref 11.2–14.5)
WBC: 7.8 10*3/uL (ref 3.9–10.3)
lymph#: 1.6 10*3/uL (ref 0.9–3.3)
nRBC: 0 % (ref 0–0)

## 2012-01-20 LAB — COMPREHENSIVE METABOLIC PANEL
ALT: 12 U/L (ref 0–35)
AST: 19 U/L (ref 0–37)
Albumin: 3.9 g/dL (ref 3.5–5.2)
Alkaline Phosphatase: 77 U/L (ref 39–117)
BUN: 12 mg/dL (ref 6–23)
CO2: 27 mEq/L (ref 19–32)
Calcium: 8.9 mg/dL (ref 8.4–10.5)
Chloride: 99 mEq/L (ref 96–112)
Creatinine, Ser: 0.79 mg/dL (ref 0.50–1.10)
Glucose, Bld: 107 mg/dL — ABNORMAL HIGH (ref 70–99)
Potassium: 3.1 mEq/L — ABNORMAL LOW (ref 3.5–5.3)
Sodium: 139 mEq/L (ref 135–145)
Total Bilirubin: 0.2 mg/dL — ABNORMAL LOW (ref 0.3–1.2)
Total Protein: 6.4 g/dL (ref 6.0–8.3)

## 2012-01-20 LAB — PROTIME-INR
INR: 2.8 (ref 2.00–3.50)
Protime: 33.6 Seconds — ABNORMAL HIGH (ref 10.6–13.4)

## 2012-01-20 LAB — LACTATE DEHYDROGENASE: LDH: 191 U/L (ref 94–250)

## 2012-01-20 LAB — CANCER ANTIGEN 27.29: CA 27.29: 69 U/mL — ABNORMAL HIGH (ref 0–39)

## 2012-01-20 MED ORDER — DEXAMETHASONE SODIUM PHOSPHATE 10 MG/ML IJ SOLN
10.0000 mg | Freq: Once | INTRAMUSCULAR | Status: AC
  Administered 2012-01-20: 10 mg via INTRAVENOUS

## 2012-01-20 MED ORDER — SODIUM CHLORIDE 0.9 % IV SOLN
Freq: Once | INTRAVENOUS | Status: AC
  Administered 2012-01-20: 14:00:00 via INTRAVENOUS

## 2012-01-20 MED ORDER — DIPHENHYDRAMINE HCL 25 MG PO CAPS
50.0000 mg | ORAL_CAPSULE | Freq: Once | ORAL | Status: AC
  Administered 2012-01-20: 50 mg via ORAL

## 2012-01-20 MED ORDER — TRASTUZUMAB CHEMO INJECTION 440 MG
4.0000 mg/kg | Freq: Once | INTRAVENOUS | Status: AC
  Administered 2012-01-20: 378 mg via INTRAVENOUS
  Filled 2012-01-20: qty 18

## 2012-01-20 MED ORDER — SODIUM CHLORIDE 0.9 % IV SOLN
800.0000 mg/m2 | Freq: Once | INTRAVENOUS | Status: AC
  Administered 2012-01-20: 1672 mg via INTRAVENOUS
  Filled 2012-01-20: qty 44

## 2012-01-20 MED ORDER — SODIUM CHLORIDE 0.9 % IJ SOLN
10.0000 mL | INTRAMUSCULAR | Status: DC | PRN
  Administered 2012-01-20: 10 mL
  Filled 2012-01-20: qty 10

## 2012-01-20 MED ORDER — SODIUM CHLORIDE 0.9 % IV SOLN
266.8000 mg | Freq: Once | INTRAVENOUS | Status: AC
  Administered 2012-01-20: 270 mg via INTRAVENOUS
  Filled 2012-01-20: qty 27

## 2012-01-20 MED ORDER — ACETAMINOPHEN 325 MG PO TABS
650.0000 mg | ORAL_TABLET | Freq: Once | ORAL | Status: AC
  Administered 2012-01-20: 650 mg via ORAL

## 2012-01-20 MED ORDER — ONDANSETRON 8 MG/50ML IVPB (CHCC)
8.0000 mg | Freq: Once | INTRAVENOUS | Status: AC
  Administered 2012-01-20: 8 mg via INTRAVENOUS

## 2012-01-20 MED ORDER — HEPARIN SOD (PORK) LOCK FLUSH 100 UNIT/ML IV SOLN
500.0000 [IU] | Freq: Once | INTRAVENOUS | Status: AC | PRN
  Administered 2012-01-20: 500 [IU]
  Filled 2012-01-20: qty 5

## 2012-01-20 NOTE — Telephone Encounter (Signed)
gave patient appointment for 01-2012 called outpatient rehab on [facility] per receptionist  she will call the patient for the information and give an appointment

## 2012-01-20 NOTE — Telephone Encounter (Signed)
ave patient appointment for rehab at [facility] at 11:00am on 02-13-2012 made patient appointment for 01-27-2012 and 02-03-2012 prnted out calendar and gave to the patient

## 2012-01-20 NOTE — Progress Notes (Signed)
Hematology and Oncology Follow Up Visit  [name] [id number] [birth year] 56 y.o. 01/20/2012    HPI: Patient is a 56 year old [place] woman with metastatic HER-2/neu positive breast carcinoma. She is due for day 1 cycle 2 of every 2 week carboplatin/Gemzar given in combination with Herceptin in the palliative setting. 2. History of deep vein thrombosis of the right upper extremity with associated significant lymphedema on Coumadin therapy.  Interim History:   [name] is seen today in anticipation of day 1 cycle 2 of every 2 week carboplatin/Gemzar given in combination with Herceptin in the palliative setting. She states that she is feeling well, and and is ready to resume her palliative chemotherapy. Of note, a 2-D echocardiogram was obtained on 01/19/2012 which revealed her left ventricular ejection fraction between 55-60%. She continues to have quite a bit of lymphedema of the right upper extremity, and wishes for referral back to physical therapy which we will accommodate. She denies any shortness of breath or chest pain. Occasional low-grade nausea which is well controlled with anti-emetics. There diarrhea or constipation issues.  A detailed review of systems is otherwise noncontributory as noted below.  Review of Systems: Constitutional:  no weight loss, fever, night sweats and fatigued Eyes: no complaints ENT: no complaints Cardiovascular: no chest pain or dyspnea on exertion Respiratory: no cough, shortness of breath, or wheezing Neurological: no TIA or stroke symptoms Dermatological: positive for lumps Gastrointestinal: no abdominal pain, change in bowel habits, or black or bloody stools Genito-Urinary: no dysuria, trouble voiding, or hematuria Hematological and Lymphatic: negative Breast: negative Musculoskeletal: negative Remaining ROS negative.   Medications:   I have reviewed the patient's current medications.  Current Outpatient Prescriptions    Medication Sig Dispense Refill  . ALPRAZolam (XANAX) 1 MG tablet Take 1 mg by mouth 3 (three) times daily as needed. For anxiety      . amLODipine (NORVASC) 10 MG tablet Take 10 mg by mouth daily.        . Ascorbic Acid (VITAMIN C) 100 MG tablet Take 100 mg by mouth daily.        . calcium-vitamin D (OSCAL WITH D) 500-200 MG-UNIT per tablet Take 1 tablet by mouth daily.        . chlorpheniramine-HYDROcodone (TUSSIONEX) 10-8 MG/5ML LQCR Take 5 mLs by mouth every 12 (twelve) hours as needed. Cold symptons      . hydrochlorothiazide (HYDRODIURIL) 25 MG tablet Take 25 mg by mouth See admin instructions. One tablet by mouth on Monday, Wednesday, and Friday       . ibuprofen (ADVIL,MOTRIN) 200 MG tablet Take 400 mg by mouth every 6 (six) hours as needed. For pain       . Multiple Vitamin (MULTIVITAMIN) capsule Take 1 capsule by mouth daily.        . ondansetron (ZOFRAN-ODT) 4 MG disintegrating tablet Take 4 mg by mouth every 8 (eight) hours as needed. For nausea       . oxyCODONE-acetaminophen (PERCOCET) 5-325 MG per tablet Take 1 tablet by mouth every 4 (four) hours as needed. For pain      . warfarin (COUMADIN) 5 MG tablet Take 2 tablets (10 mg total) by mouth daily.  60 tablet  2    Allergies:  Allergies  Allergen Reactions  . Hydrocodone-Acetaminophen Itching and Rash    Physical Exam: Filed Vitals:   01/20/12 1058  BP: 106/70  Pulse: 97  Temp: 97.6 F (36.4 C)  Weight: 204 lbs. HEENT:  Sclerae anicteric, conjunctivae pink.  Oropharynx clear.  No mucositis or candidiasis.   Nodes: Obvious palpable lymphadenopathy in the right supraclavicular region along with subcutaneous nodules noted over the upper right anterior chest wall extending to the upper back region. Breast Exam: Deferred.   Lungs:  Clear to auscultation without wheezing or rhonchi appreciated. Heart:  Regular rate and rhythm.   Abdomen:  Soft, nontender.  Positive bowel sounds.  No organomegaly or masses palpated.    Musculoskeletal:  No focal spinal tenderness to palpation.  Extremities: Obvious lymphedema of the right upper extremity, compression garment in place.   Skin:  Benign.   Neuro:  Nonfocal, essentially alert and oriented though patient appears distracted.  Lab Results: Lab Results  Component Value Date   WBC 7.8 01/20/2012   HGB 12.4 01/20/2012   HCT 37.9 01/20/2012   MCV 82.9 01/20/2012   PLT 278 01/20/2012   NEUTROABS 5.3 01/20/2012     Chemistry      Component Value Date/Time   NA 139 12/09/2011 1118   K 3.4* 12/09/2011 1118   CL 99 12/09/2011 1118   CO2 30 12/09/2011 1118   BUN 12 12/09/2011 1118   CREATININE 0.86 12/09/2011 1118      Component Value Date/Time   CALCIUM 9.2 12/09/2011 1118   ALKPHOS 85 12/09/2011 1118   AST 19 12/09/2011 1118   ALT 11 12/09/2011 1118   BILITOT 0.2* 12/09/2011 1118     2D Echocardiogram: 01/19/12 Study Conclusions - Left ventricle: The cavity size was normal. Systolic function was normal. The estimated ejection fraction was in the range of 55% to 60%. Wall motion was normal; there were no regional wall motion abnormalities. - Aortic valve: Mild regurgitation. - Left atrium: The atrium was mildly dilated. Transthoracic echocardiography. M-mode, complete 2D, spectral Doppler, and color Doppler. Height: Height: 167.6cm. Height: 66in. Weight: Weight: 90.7kg. Weight: 199.6lb. Body mass index: BMI: 32.3kg/m^2. Body surface area: BSA: 38m^2. Patient status: Outpatient. Location: Echo laboratory. Prepared and Electronically Authenticated by Georga Hacking, MD [facility]     Assessment:  Patient is a 56 year old [place] woman with metastatic HER-2/neu positive breast carcinoma. She is due for day 1 cycle 2 of every 2 week carboplatin/Gemzar given in combination with Herceptin in the palliative setting. 2. History of deep vein thrombosis of the right upper extremity with associated significant lymphedema on Coumadin therapy. 3. Persistent right  upper arm lymphedema   Case reviewed with Dr. Pierce Crane.  Plan:  [name] proceed to day 1 cycle 2 of every 2 week carboplatin/Gemzar given in combination with Herceptin in the palliative setting today as scheduled. She will not require Neulasta on day 2.  We will also refer her back to physical therapy for appropriate intervention due to her right upper extremity lymphedema. I will see her on 01/27/2012 along with a repeat CBC and PT/INR. We would then plan her next cycle of treatment on 02/03/2012, and in the interim, we'll try to get preauthorization for Kadcyla. Patient understands and agrees with this plan.  This plan was reviewed with the patient, who voices understanding and agreement.  She knows to call with any changes or problems.    Alazay Leicht T, PA-C 01/20/2012

## 2012-01-26 ENCOUNTER — Ambulatory Visit (HOSPITAL_BASED_OUTPATIENT_CLINIC_OR_DEPARTMENT_OTHER): Payer: Medicare Other | Admitting: Physician Assistant

## 2012-01-26 ENCOUNTER — Other Ambulatory Visit (HOSPITAL_BASED_OUTPATIENT_CLINIC_OR_DEPARTMENT_OTHER): Payer: Medicare Other | Admitting: Lab

## 2012-01-26 VITALS — BP 100/68 | HR 98 | Temp 97.5°F | Ht 64.5 in | Wt 205.9 lb

## 2012-01-26 DIAGNOSIS — E8989 Other postprocedural endocrine and metabolic complications and disorders: Secondary | ICD-10-CM

## 2012-01-26 DIAGNOSIS — I82609 Acute embolism and thrombosis of unspecified veins of unspecified upper extremity: Secondary | ICD-10-CM

## 2012-01-26 DIAGNOSIS — I890 Lymphedema, not elsewhere classified: Secondary | ICD-10-CM

## 2012-01-26 DIAGNOSIS — Z7901 Long term (current) use of anticoagulants: Secondary | ICD-10-CM

## 2012-01-26 DIAGNOSIS — I82409 Acute embolism and thrombosis of unspecified deep veins of unspecified lower extremity: Secondary | ICD-10-CM

## 2012-01-26 DIAGNOSIS — C50919 Malignant neoplasm of unspecified site of unspecified female breast: Secondary | ICD-10-CM

## 2012-01-26 LAB — CBC WITH DIFFERENTIAL/PLATELET
BASO%: 0.5 % (ref 0.0–2.0)
Basophils Absolute: 0 10*3/uL (ref 0.0–0.1)
EOS%: 3 % (ref 0.0–7.0)
Eosinophils Absolute: 0.2 10*3/uL (ref 0.0–0.5)
HCT: 35.9 % (ref 34.8–46.6)
HGB: 11.8 g/dL (ref 11.6–15.9)
LYMPH%: 32.3 % (ref 14.0–49.7)
MCH: 27.3 pg (ref 25.1–34.0)
MCHC: 32.9 g/dL (ref 31.5–36.0)
MCV: 83.1 fL (ref 79.5–101.0)
MONO#: 0.1 10*3/uL (ref 0.1–0.9)
MONO%: 1.4 % (ref 0.0–14.0)
NEUT#: 3.6 10*3/uL (ref 1.5–6.5)
NEUT%: 62.8 % (ref 38.4–76.8)
Platelets: 349 10*3/uL (ref 145–400)
RBC: 4.32 10*6/uL (ref 3.70–5.45)
RDW: 17.1 % — ABNORMAL HIGH (ref 11.2–14.5)
WBC: 5.7 10*3/uL (ref 3.9–10.3)
lymph#: 1.9 10*3/uL (ref 0.9–3.3)
nRBC: 0 % (ref 0–0)

## 2012-01-26 LAB — PROTIME-INR
INR: 2.4 (ref 2.00–3.50)
Protime: 28.8 Seconds — ABNORMAL HIGH (ref 10.6–13.4)

## 2012-01-26 NOTE — Progress Notes (Signed)
Hematology and Oncology Follow Up Visit  [name] [id number] 04-Nov-1956 56 y.o. 01/26/2012    HPI: Patient is a 56 year old [place] woman with metastatic HER-2/neu positive breast carcinoma. She is currently day 6 cycle 2 of every 2 week carboplatin/Gemzar given in combination with Herceptin in the palliative setting. 2. History of deep vein thrombosis of the right upper extremity with associated significant lymphedema on Coumadin therapy, 5mg  daily.  Interim History:   [name] is seen today for followup after her second cycle of every 2 week carboplatin/Gemzar and Herceptin given in the palliative setting for metastatic HER-2 positive breast carcinoma. She also continues on Coumadin 5 mg per day. She voices no specific complaints except that she is fatigued. She admits that she probably a bit too much this morning, and she said some low-grade queasiness but denies any need for anti-emetics. Her bowels are moving without difficulty. She denies any bleeding or bruising symptoms. She is going to be seen in the physical therapy department on 01/30/2012 for lymphedema treatment of her right upper extremity.  A detailed review of systems is otherwise noncontributory as noted below.  Review of Systems: Constitutional:  no weight loss, fever, night sweats and fatigued Eyes: no complaints ENT: no complaints Cardiovascular: no chest pain or dyspnea on exertion Respiratory: no cough, shortness of breath, or wheezing Neurological: no TIA or stroke symptoms Dermatological: positive for lumps Gastrointestinal: no abdominal pain, change in bowel habits, or black or bloody stools Genito-Urinary: no dysuria, trouble voiding, or hematuria Hematological and Lymphatic: negative Breast: negative Musculoskeletal: negative Remaining ROS negative.   Medications:   I have reviewed the patient's current medications.  Current Outpatient Prescriptions  Medication Sig Dispense Refill  .  ALPRAZolam (XANAX) 1 MG tablet Take 1 mg by mouth 3 (three) times daily as needed. For anxiety      . amLODipine (NORVASC) 10 MG tablet Take 10 mg by mouth daily.        . Ascorbic Acid (VITAMIN C) 100 MG tablet Take 100 mg by mouth daily.        . calcium-vitamin D (OSCAL WITH D) 500-200 MG-UNIT per tablet Take 1 tablet by mouth daily.        . chlorpheniramine-HYDROcodone (TUSSIONEX) 10-8 MG/5ML LQCR Take 5 mLs by mouth every 12 (twelve) hours as needed. Cold symptons      . hydrochlorothiazide (HYDRODIURIL) 25 MG tablet Take 25 mg by mouth See admin instructions. One tablet by mouth on Monday, Wednesday, and Friday       . ibuprofen (ADVIL,MOTRIN) 200 MG tablet Take 400 mg by mouth every 6 (six) hours as needed. For pain       . Multiple Vitamin (MULTIVITAMIN) capsule Take 1 capsule by mouth daily.        . ondansetron (ZOFRAN-ODT) 4 MG disintegrating tablet Take 4 mg by mouth every 8 (eight) hours as needed. For nausea       . oxyCODONE-acetaminophen (PERCOCET) 5-325 MG per tablet Take 1 tablet by mouth every 4 (four) hours as needed. For pain      . warfarin (COUMADIN) 5 MG tablet Take 2 tablets (10 mg total) by mouth daily.  60 tablet  2    Allergies:  Allergies  Allergen Reactions  . Hydrocodone-Acetaminophen Itching and Rash    Physical Exam: Filed Vitals:   01/26/12 1121  BP: 100/68  Pulse: 98  Temp: 97.5 F (36.4 C)  Weight: 205 lbs. HEENT:  Sclerae anicteric, conjunctivae pink.  Oropharynx clear.  No mucositis or candidiasis.   Nodes: Obvious palpable lymphadenopathy in the right supraclavicular region along with subcutaneous nodules noted over the upper right anterior chest wall extending to the upper back region. Breast Exam: Deferred.   Lungs:  Clear to auscultation without wheezing or rhonchi appreciated. Heart:  Regular rate and rhythm.   Abdomen:  Soft, nontender.  Positive bowel sounds.  No organomegaly or masses palpated.   Musculoskeletal:  No focal spinal  tenderness to palpation.  Extremities: Obvious lymphedema of the right upper extremity, compression garment in place.   Skin:  Benign.   Neuro:  Nonfocal, essentially alert and oriented though patient appears distracted.  Lab Results: Lab Results  Component Value Date   WBC 5.7 01/26/2012   HGB 11.8 01/26/2012   HCT 35.9 01/26/2012   MCV 83.1 01/26/2012   PLT 349 01/26/2012   NEUTROABS 3.6 01/26/2012     Chemistry      Component Value Date/Time   NA 139 01/20/2012 1036   K 3.1* 01/20/2012 1036   CL 99 01/20/2012 1036   CO2 27 01/20/2012 1036   BUN 12 01/20/2012 1036   CREATININE 0.79 01/20/2012 1036      Component Value Date/Time   CALCIUM 8.9 01/20/2012 1036   ALKPHOS 77 01/20/2012 1036   AST 19 01/20/2012 1036   ALT 12 01/20/2012 1036   BILITOT 0.2* 01/20/2012 1036     2D Echocardiogram: 01/19/12 Study Conclusions - Left ventricle: The cavity size was normal. Systolic function was normal. The estimated ejection fraction was in the range of 55% to 60%. Wall motion was normal; there were no regional wall motion abnormalities. - Aortic valve: Mild regurgitation. - Left atrium: The atrium was mildly dilated. Transthoracic echocardiography. M-mode, complete 2D, spectral Doppler, and color Doppler. Height: Height: 167.6cm. Height: 66in. Weight: Weight: 90.7kg. Weight: 199.6lb. Body mass index: BMI: 32.3kg/m^2. Body surface area: BSA: 50m^2. Patient status: Outpatient. Location: Echo laboratory. Prepared and Electronically Authenticated by Georga Hacking, MD [facility]     Assessment:  Patient is a 56 year old [place] woman with metastatic HER-2/neu positive breast carcinoma. She is currently day 6 cycle 2 of every 2 week carboplatin/Gemzar given in combination with Herceptin in the palliative setting. 2. History of deep vein thrombosis of the right upper extremity with associated significant lymphedema on Coumadin therapy, with a therapeutic INR. 3. Persistent right  upper arm lymphedema, pending appointment in physical therapy.  Case reviewed with Dr. Pierce Crane.  Plan:  [name] is will return in one week's time for followup prior to day 1 cycle 3 of every 2 week carboplatin/Gemzar given in combination with Herceptin in the palliative setting today as scheduled. Of note, CBC, serum chemistry, PT/INR and CA 27-29 will be repeated. She will continue followup through the physical therapy department. Patient understands and agrees with this plan.  This plan was reviewed with the patient, who voices understanding and agreement.  She knows to call with any changes or problems.    Khadir Roam T, PA-C 01/26/2012

## 2012-01-30 ENCOUNTER — Ambulatory Visit: Payer: Medicare Other | Attending: Physician Assistant | Admitting: Physical Therapy

## 2012-01-30 DIAGNOSIS — M6281 Muscle weakness (generalized): Secondary | ICD-10-CM | POA: Insufficient documentation

## 2012-01-30 DIAGNOSIS — IMO0001 Reserved for inherently not codable concepts without codable children: Secondary | ICD-10-CM | POA: Insufficient documentation

## 2012-01-30 DIAGNOSIS — M25619 Stiffness of unspecified shoulder, not elsewhere classified: Secondary | ICD-10-CM | POA: Insufficient documentation

## 2012-02-01 ENCOUNTER — Ambulatory Visit: Payer: Medicare Other | Admitting: Physical Therapy

## 2012-02-02 ENCOUNTER — Ambulatory Visit: Payer: Medicare Other | Admitting: Physical Therapy

## 2012-02-02 ENCOUNTER — Other Ambulatory Visit: Payer: Self-pay | Admitting: Emergency Medicine

## 2012-02-03 ENCOUNTER — Other Ambulatory Visit (HOSPITAL_BASED_OUTPATIENT_CLINIC_OR_DEPARTMENT_OTHER): Payer: Medicare Other | Admitting: Lab

## 2012-02-03 ENCOUNTER — Telehealth: Payer: Self-pay | Admitting: Oncology

## 2012-02-03 ENCOUNTER — Other Ambulatory Visit: Payer: Self-pay | Admitting: Oncology

## 2012-02-03 ENCOUNTER — Encounter: Payer: Self-pay | Admitting: Physician Assistant

## 2012-02-03 ENCOUNTER — Ambulatory Visit (HOSPITAL_BASED_OUTPATIENT_CLINIC_OR_DEPARTMENT_OTHER): Payer: Medicare Other

## 2012-02-03 ENCOUNTER — Ambulatory Visit: Payer: Medicare Other | Admitting: Physician Assistant

## 2012-02-03 VITALS — BP 112/74 | HR 93 | Temp 98.3°F | Ht 64.5 in | Wt 205.0 lb

## 2012-02-03 DIAGNOSIS — C50919 Malignant neoplasm of unspecified site of unspecified female breast: Secondary | ICD-10-CM

## 2012-02-03 DIAGNOSIS — I82409 Acute embolism and thrombosis of unspecified deep veins of unspecified lower extremity: Secondary | ICD-10-CM

## 2012-02-03 DIAGNOSIS — Z5111 Encounter for antineoplastic chemotherapy: Secondary | ICD-10-CM

## 2012-02-03 LAB — CBC WITH DIFFERENTIAL/PLATELET
BASO%: 0.5 % (ref 0.0–2.0)
Basophils Absolute: 0 10*3/uL (ref 0.0–0.1)
EOS%: 1.4 % (ref 0.0–7.0)
Eosinophils Absolute: 0.1 10*3/uL (ref 0.0–0.5)
HCT: 36.4 % (ref 34.8–46.6)
HGB: 12.1 g/dL (ref 11.6–15.9)
LYMPH%: 16.6 % (ref 14.0–49.7)
MCH: 27.3 pg (ref 25.1–34.0)
MCHC: 33.2 g/dL (ref 31.5–36.0)
MCV: 82.2 fL (ref 79.5–101.0)
MONO#: 0.5 10*3/uL (ref 0.1–0.9)
MONO%: 7.5 % (ref 0.0–14.0)
NEUT#: 4.9 10*3/uL (ref 1.5–6.5)
NEUT%: 74 % (ref 38.4–76.8)
Platelets: 297 10*3/uL (ref 145–400)
RBC: 4.43 10*6/uL (ref 3.70–5.45)
RDW: 17.3 % — ABNORMAL HIGH (ref 11.2–14.5)
WBC: 6.6 10*3/uL (ref 3.9–10.3)
lymph#: 1.1 10*3/uL (ref 0.9–3.3)
nRBC: 0 % (ref 0–0)

## 2012-02-03 LAB — PROTIME-INR
INR: 1.3 — ABNORMAL LOW (ref 2.00–3.50)
Protime: 15.6 Seconds — ABNORMAL HIGH (ref 10.6–13.4)

## 2012-02-03 LAB — COMPREHENSIVE METABOLIC PANEL
ALT: <8 U/L (ref 0–35)
AST: 19 U/L (ref 0–37)
Albumin: 4.2 g/dL (ref 3.5–5.2)
Alkaline Phosphatase: 80 U/L (ref 39–117)
BUN: 10 mg/dL (ref 6–23)
CO2: 27 mEq/L (ref 19–32)
Calcium: 9.3 mg/dL (ref 8.4–10.5)
Chloride: 105 mEq/L (ref 96–112)
Creatinine, Ser: 0.79 mg/dL (ref 0.50–1.10)
Glucose, Bld: 99 mg/dL (ref 70–99)
Potassium: 3.7 mEq/L (ref 3.5–5.3)
Sodium: 141 mEq/L (ref 135–145)
Total Bilirubin: 0.3 mg/dL (ref 0.3–1.2)
Total Protein: 7.4 g/dL (ref 6.0–8.3)

## 2012-02-03 LAB — CANCER ANTIGEN 27.29: CA 27.29: 89 U/mL — ABNORMAL HIGH (ref 0–39)

## 2012-02-03 MED ORDER — SODIUM CHLORIDE 0.9 % IV SOLN: 3.6000 mg/kg | Freq: Once | INTRAVENOUS | Status: DC

## 2012-02-03 MED ORDER — MORPHINE SULFATE CR 15 MG PO TB12: 15.0000 mg | ORAL_TABLET | Freq: Two times a day (BID) | ORAL | Status: AC

## 2012-02-03 MED ORDER — SODIUM CHLORIDE 0.9 % IV SOLN
Freq: Once | INTRAVENOUS | Status: AC
  Administered 2012-02-03: 13:00:00 via INTRAVENOUS

## 2012-02-03 MED ORDER — SODIUM CHLORIDE 0.9 % IJ SOLN
10.0000 mL | INTRAMUSCULAR | Status: DC | PRN
  Administered 2012-02-03: 10 mL
  Filled 2012-02-03: qty 10

## 2012-02-03 MED ORDER — DIPHENHYDRAMINE HCL 25 MG PO CAPS
50.0000 mg | ORAL_CAPSULE | Freq: Once | ORAL | Status: AC
  Administered 2012-02-03: 50 mg via ORAL

## 2012-02-03 MED ORDER — SODIUM CHLORIDE 0.9 % IV SOLN
320.0000 mg | Freq: Once | INTRAVENOUS | Status: DC
  Filled 2012-02-03: qty 16

## 2012-02-03 MED ORDER — SODIUM CHLORIDE 0.9 % IV SOLN
3.6000 mg/kg | Freq: Once | INTRAVENOUS | Status: DC
  Administered 2012-02-03: 13:00:00 via INTRAVENOUS

## 2012-02-03 MED ORDER — HEPARIN SOD (PORK) LOCK FLUSH 100 UNIT/ML IV SOLN
500.0000 [IU] | Freq: Once | INTRAVENOUS | Status: AC | PRN
  Administered 2012-02-03: 500 [IU]
  Filled 2012-02-03: qty 5

## 2012-02-03 MED ORDER — ACETAMINOPHEN 325 MG PO TABS
650.0000 mg | ORAL_TABLET | Freq: Once | ORAL | Status: AC
  Administered 2012-02-03: 650 mg via ORAL

## 2012-02-03 MED ORDER — MORPHINE SULFATE 15 MG PO TABS: 15.0000 mg | ORAL_TABLET | ORAL | Status: AC | PRN

## 2012-02-03 NOTE — Progress Notes (Signed)
Hematology and Oncology Follow Up Visit  [name] [id number] October 21, 1956 56 y.o. 02/03/2012    HPI: Patient is a 56 year old [place] woman with metastatic HER-2/neu positive breast carcinoma. She is currently day 6 cycle 2 of every 2 week carboplatin/Gemzar given in combination with Herceptin in the palliative setting.  2. History of deep vein thrombosis of the right upper extremity with associated significant lymphedema on Coumadin therapy, 5mg  daily.  Interim History:   [name] is seen today for followup prior to cycle 3 of every 2 week carboplatin/Gemzar and Herceptin given in the palliative setting for metastatic HER-2 positive breast carcinoma. She also continues on Coumadin 5 mg per day. She voices no specific complaints except that she is fatigued. She admits that she is having more pain in her right upper extremity, and along the right side of her neck. She has utilized Percocet 10/325 which was prescribed by her primary care physician, but she has run out. She states that she'll be seeing her PCP on 02/26/2012. She also has continued intermittent wheezing and cough, she wonders about a prescription cough suppressant. She denies fevers or chills. No bleeding or bruising symptoms. Her appetite is good to decrease the she denies any frank nausea, no emesis diarrhea or constipation. A detailed review of systems is otherwise noncontributory as noted below.  Review of Systems: Constitutional:  no weight loss, fever, night sweats and fatigued Eyes: no complaints ENT: no complaints Cardiovascular: no chest pain or dyspnea on exertion Respiratory: no cough, shortness of breath, or wheezing Neurological: no TIA or stroke symptoms Dermatological: positive for lumps Gastrointestinal: no abdominal pain, change in bowel habits, or black or bloody stools Genito-Urinary: no dysuria, trouble voiding, or hematuria Hematological and Lymphatic: negative Breast:  negative Musculoskeletal: negative Remaining ROS negative.   Medications:   I have reviewed the patient's current medications.  Current Outpatient Prescriptions  Medication Sig Dispense Refill  . ALPRAZolam (XANAX) 1 MG tablet Take 1 mg by mouth 3 (three) times daily as needed. For anxiety      . amLODipine (NORVASC) 10 MG tablet Take 10 mg by mouth daily.        . Ascorbic Acid (VITAMIN C) 100 MG tablet Take 100 mg by mouth daily.        . calcium-vitamin D (OSCAL WITH D) 500-200 MG-UNIT per tablet Take 1 tablet by mouth daily.        . chlorpheniramine-HYDROcodone (TUSSIONEX) 10-8 MG/5ML LQCR Take 5 mLs by mouth every 12 (twelve) hours as needed. Cold symptons      . hydrochlorothiazide (HYDRODIURIL) 25 MG tablet Take 25 mg by mouth See admin instructions. One tablet by mouth on Monday, Wednesday, and Friday       . ibuprofen (ADVIL,MOTRIN) 200 MG tablet Take 400 mg by mouth every 6 (six) hours as needed. For pain       . Multiple Vitamin (MULTIVITAMIN) capsule Take 1 capsule by mouth daily.        . ondansetron (ZOFRAN-ODT) 4 MG disintegrating tablet Take 4 mg by mouth every 8 (eight) hours as needed. For nausea       . oxyCODONE-acetaminophen (PERCOCET) 5-325 MG per tablet Take 1 tablet by mouth every 4 (four) hours as needed. For pain      . warfarin (COUMADIN) 5 MG tablet Take 2 tablets (10 mg total) by mouth daily.  60 tablet  2    Allergies:  Allergies  Allergen Reactions  . Hydrocodone-Acetaminophen Itching and Rash  Physical Exam: Filed Vitals:   02/03/12 1119  BP: 112/74  Pulse: 93  Temp: 98.3 F (36.8 C)  Weight: 205 lbs. HEENT:  Sclerae anicteric, conjunctivae pink.  Oropharynx clear.  No mucositis or candidiasis.   Nodes: Obvious progression in the palpable lymphadenopathy in the right supraclavicular region along with subcutaneous nodules noted over the upper right anterior chest wall extending to the upper back region. Breast Exam: Deferred.   Lungs:  Clear  to auscultation without wheezing or rhonchi appreciated. Heart:  Regular rate and rhythm.   Abdomen:  Soft, nontender.  Positive bowel sounds.  No organomegaly or masses palpated.   Musculoskeletal:  No focal spinal tenderness to palpation.  Extremities: Obvious lymphedema of the right upper extremity, compression wrapping in place.   Skin:  Benign.   Neuro:  Nonfocal, essentially alert and oriented though patient appears distracted.  Lab Results: Lab Results  Component Value Date   WBC 6.6 02/03/2012   HGB 12.1 02/03/2012   HCT 36.4 02/03/2012   MCV 82.2 02/03/2012   PLT 297 02/03/2012   NEUTROABS 4.9 02/03/2012     Chemistry      Component Value Date/Time   NA 139 01/20/2012 1036   K 3.1* 01/20/2012 1036   CL 99 01/20/2012 1036   CO2 27 01/20/2012 1036   BUN 12 01/20/2012 1036   CREATININE 0.79 01/20/2012 1036      Component Value Date/Time   CALCIUM 8.9 01/20/2012 1036   ALKPHOS 77 01/20/2012 1036   AST 19 01/20/2012 1036   ALT 12 01/20/2012 1036   BILITOT 0.2* 01/20/2012 1036     2D Echocardiogram: 01/19/12 Study Conclusions - Left ventricle: The cavity size was normal. Systolic function was normal. The estimated ejection fraction was in the range of 55% to 60%. Wall motion was normal; there were no regional wall motion abnormalities. - Aortic valve: Mild regurgitation. - Left atrium: The atrium was mildly dilated. Transthoracic echocardiography. M-mode, complete 2D, spectral Doppler, and color Doppler. Height: Height: 167.6cm. Height: 66in. Weight: Weight: 90.7kg. Weight: 199.6lb. Body mass index: BMI: 32.3kg/m^2. Body surface area: BSA: 96m^2. Patient status: Outpatient. Location: Echo laboratory. Prepared and Electronically Authenticated by Georga Hacking, MD [facility]     Assessment:  Patient is a 56 year old [place] woman with metastatic HER-2/neu positive breast carcinoma. She is currently day 1 cycle 3 of every 2 week carboplatin/Gemzar given in  combination with Herceptin in the palliative setting, clinical evidence of obvious progression. 2. History of deep vein thrombosis of the right upper extremity with associated significant lymphedema on Coumadin therapy, with a subtherapeutic INR. 3. Persistent right upper arm lymphedema, pending appointment in physical therapy.  Case reviewed with Dr. Pierce Crane, who also examined the patient.  Plan:  We will discontinue carboplatinum/Gemzar/Herceptin regimen at this time due to obvious progression. Breona will initiate  Kadcyla on an every three-week basis in the palliative setting as of today. Side effect profile has been reviewed and discussed and she is in agreement. Pertaining to her Coumadin, she will continue on 5 mg per day with repeat PT/INR on 02/09/2012 along with physical exam. We are concerned about her pain control. We will initiate MS Contin 15 mg by mouth twice a day with MSIR 15 mg for breakthrough pain. It should be noted, she was very reluctant to take these prescriptions, but we have reiterated that she has obvious disease progression, and pain control is paramount. She will continue followup through the physical therapy department. Patient understands  and agrees with this plan.  This plan was reviewed with the patient, who voices understanding and agreement.  She knows to call with any changes or problems.    Mackenzie Lia T, PA-C 02/03/2012

## 2012-02-03 NOTE — Telephone Encounter (Signed)
Pt was given an April appt calendar by [facility] in the chemo room

## 2012-02-06 ENCOUNTER — Ambulatory Visit: Payer: Medicare Other | Attending: Physician Assistant | Admitting: Physical Therapy

## 2012-02-06 ENCOUNTER — Telehealth: Payer: Self-pay

## 2012-02-06 DIAGNOSIS — M25619 Stiffness of unspecified shoulder, not elsewhere classified: Secondary | ICD-10-CM | POA: Insufficient documentation

## 2012-02-06 DIAGNOSIS — M6281 Muscle weakness (generalized): Secondary | ICD-10-CM | POA: Insufficient documentation

## 2012-02-06 DIAGNOSIS — IMO0001 Reserved for inherently not codable concepts without codable children: Secondary | ICD-10-CM | POA: Insufficient documentation

## 2012-02-06 NOTE — Telephone Encounter (Signed)
Called pt re: chemo f/u call.  Left message for pt to call office back (unidentified vm).

## 2012-02-06 NOTE — Telephone Encounter (Signed)
Message copied by Abby Potash on Mon Feb 06, 2012  2:35 PM ------      Message from: Lorenza Evangelist A      Created: Fri Feb 03, 2012  5:38 PM      Regarding: Chemo follow up call       First time Kadcyla; Dr Donnie Coffin

## 2012-02-07 MED FILL — Ado-Trastuzumab Emtansine For IV Soln 160 MG: INTRAVENOUS | Qty: 320 | Status: AC

## 2012-02-08 ENCOUNTER — Ambulatory Visit: Payer: Medicare Other

## 2012-02-09 ENCOUNTER — Encounter: Payer: Self-pay | Admitting: Physician Assistant

## 2012-02-09 ENCOUNTER — Ambulatory Visit (HOSPITAL_BASED_OUTPATIENT_CLINIC_OR_DEPARTMENT_OTHER): Payer: Medicare Other | Admitting: Physician Assistant

## 2012-02-09 ENCOUNTER — Other Ambulatory Visit (HOSPITAL_BASED_OUTPATIENT_CLINIC_OR_DEPARTMENT_OTHER): Payer: Medicare Other | Admitting: Lab

## 2012-02-09 VITALS — BP 121/76 | HR 108 | Temp 98.4°F | Ht 64.5 in | Wt 208.5 lb

## 2012-02-09 DIAGNOSIS — Z7901 Long term (current) use of anticoagulants: Secondary | ICD-10-CM

## 2012-02-09 DIAGNOSIS — I82409 Acute embolism and thrombosis of unspecified deep veins of unspecified lower extremity: Secondary | ICD-10-CM

## 2012-02-09 DIAGNOSIS — C50419 Malignant neoplasm of upper-outer quadrant of unspecified female breast: Secondary | ICD-10-CM

## 2012-02-09 DIAGNOSIS — I890 Lymphedema, not elsewhere classified: Secondary | ICD-10-CM

## 2012-02-09 DIAGNOSIS — C50919 Malignant neoplasm of unspecified site of unspecified female breast: Secondary | ICD-10-CM

## 2012-02-09 DIAGNOSIS — Z86718 Personal history of other venous thrombosis and embolism: Secondary | ICD-10-CM

## 2012-02-09 DIAGNOSIS — R05 Cough: Secondary | ICD-10-CM

## 2012-02-09 DIAGNOSIS — R059 Cough, unspecified: Secondary | ICD-10-CM

## 2012-02-09 LAB — CBC WITH DIFFERENTIAL/PLATELET
BASO%: 0.5 % (ref 0.0–2.0)
Basophils Absolute: 0 10*3/uL (ref 0.0–0.1)
EOS%: 4.6 % (ref 0.0–7.0)
Eosinophils Absolute: 0.3 10*3/uL (ref 0.0–0.5)
HCT: 36.8 % (ref 34.8–46.6)
HGB: 12 g/dL (ref 11.6–15.9)
LYMPH%: 21.1 % (ref 14.0–49.7)
MCH: 27.3 pg (ref 25.1–34.0)
MCHC: 32.6 g/dL (ref 31.5–36.0)
MCV: 83.6 fL (ref 79.5–101.0)
MONO#: 1.3 10*3/uL — ABNORMAL HIGH (ref 0.1–0.9)
MONO%: 17.4 % — ABNORMAL HIGH (ref 0.0–14.0)
NEUT#: 4.1 10*3/uL (ref 1.5–6.5)
NEUT%: 56.4 % (ref 38.4–76.8)
Platelets: 148 10*3/uL (ref 145–400)
RBC: 4.4 10*6/uL (ref 3.70–5.45)
RDW: 16.6 % — ABNORMAL HIGH (ref 11.2–14.5)
WBC: 7.3 10*3/uL (ref 3.9–10.3)
lymph#: 1.6 10*3/uL (ref 0.9–3.3)
nRBC: 0 % (ref 0–0)

## 2012-02-09 LAB — PROTIME-INR
INR: 1.2 — ABNORMAL LOW (ref 2.00–3.50)
Protime: 14.4 Seconds — ABNORMAL HIGH (ref 10.6–13.4)

## 2012-02-09 NOTE — Progress Notes (Signed)
Hematology and Oncology Follow Up Visit  [name] [id number] October 06, 1956 56 y.o. 02/09/2012    HPI: Patient is a 56 year old [place] woman with metastatic HER-2/neu positive breast carcinoma. She is currently day 6 cycle 1 of every 3 week Kadcyla given in the palliative setting, evidence to suggest possible improvement of symptomatology. 2. History of deep vein thrombosis of the right upper extremity with associated significant lymphedema on Coumadin therapy, with a subtherapeutic INR. 3. Persistent right upper arm lymphedema, active treatment to the lymphedema clinic.  Interim History:   [name] is seen today for followup after her first cycle of palliative every 3 week Kadcyla.  She feels that her arm has improved. She has refused to take MS Contin or MSIR for pain, she also states that she is not having pain at this time. She does request a refill prescription for Phenergan/codeine cough suppressant. She denies any increasing shortness of breath, she feels her cough has improved a bit, it is nonproductive for intermittent "dark green" sputum. She denies any nausea, no emesis, no diarrhea or constipation issues. She continues on Coumadin at 5 mg per day, but is aware that her INR is subtherapeutic, therefore she will increase it to 7.5 mg. She is continuing to be assessed to the lymphedema clinic. A detailed review of systems is otherwise noncontributory as noted below.  Review of Systems: Constitutional:  no weight loss, fever, night sweats and fatigued Eyes: no complaints ENT: no complaints Cardiovascular: no chest pain or dyspnea on exertion Respiratory: no cough, shortness of breath, or wheezing Neurological: no TIA or stroke symptoms Dermatological: positive for lumps Gastrointestinal: no abdominal pain, change in bowel habits, or black or bloody stools Genito-Urinary: no dysuria, trouble voiding, or hematuria Hematological and Lymphatic: negative Breast:  negative Musculoskeletal: negative Remaining ROS negative.   Medications:   I have reviewed the patient's current medications.  Current Outpatient Prescriptions  Medication Sig Dispense Refill  . ALPRAZolam (XANAX) 1 MG tablet Take 1 mg by mouth 3 (three) times daily as needed. For anxiety      . amLODipine (NORVASC) 10 MG tablet Take 10 mg by mouth daily.        . Ascorbic Acid (VITAMIN C) 100 MG tablet Take 100 mg by mouth daily.        . calcium-vitamin D (OSCAL WITH D) 500-200 MG-UNIT per tablet Take 1 tablet by mouth daily.        . hydrochlorothiazide (HYDRODIURIL) 25 MG tablet Take 25 mg by mouth See admin instructions. One tablet by mouth on Monday, Wednesday, and Friday       . ibuprofen (ADVIL,MOTRIN) 200 MG tablet Take 400 mg by mouth every 6 (six) hours as needed. For pain       . morphine (MS CONTIN) 15 MG 12 hr tablet Take 1 tablet (15 mg total) by mouth 2 (two) times daily.  60 tablet  0  . morphine (MSIR) 15 MG tablet Take 1 tablet (15 mg total) by mouth every 4 (four) hours as needed for pain.  100 tablet  0  . Multiple Vitamin (MULTIVITAMIN) capsule Take 1 capsule by mouth daily.        . ondansetron (ZOFRAN-ODT) 4 MG disintegrating tablet Take 4 mg by mouth every 8 (eight) hours as needed. For nausea       . warfarin (COUMADIN) 5 MG tablet Take 2 tablets (10 mg total) by mouth daily.  60 tablet  2    Allergies:  Allergies  Allergen  Reactions  . Hydrocodone-Acetaminophen Itching and Rash    Physical Exam: Filed Vitals:   02/09/12 0947  BP: 121/76  Pulse: 108  Temp: 98.4 F (36.9 C)  Weight: 208 lbs. HEENT:  Sclerae anicteric, conjunctivae pink.  Oropharynx clear.  No mucositis or candidiasis.   Nodes: The region over the right shoulder, right upper anterior and posterior chest wall region actually appears to be better. Is not quite as erythematous as last week. Maybe the nodularity is also eased up a bit. Breast Exam: Deferred.   Lungs:  Clear to auscultation  without wheezing or rhonchi appreciated. Heart:  Regular rate and rhythm.   Abdomen:  Soft, nontender.  Positive bowel sounds.  No organomegaly or masses palpated.   Musculoskeletal:  No focal spinal tenderness to palpation.  Extremities: Obvious lymphedema of the right upper extremity, compression wrapping in place.   Skin:  Benign.   Neuro:  Nonfocal, essentially alert and oriented though patient appears distracted.  Lab Results: Lab Results  Component Value Date   WBC 7.3 02/09/2012   HGB 12.0 02/09/2012   HCT 36.8 02/09/2012   MCV 83.6 02/09/2012   PLT 148 02/09/2012   NEUTROABS 4.1 02/09/2012     Chemistry      Component Value Date/Time   NA 141 02/03/2012 1046   K 3.7 02/03/2012 1046   CL 105 02/03/2012 1046   CO2 27 02/03/2012 1046   BUN 10 02/03/2012 1046   CREATININE 0.79 02/03/2012 1046      Component Value Date/Time   CALCIUM 9.3 02/03/2012 1046   ALKPHOS 80 02/03/2012 1046   AST 19 02/03/2012 1046   ALT <8 02/03/2012 1046   BILITOT 0.3 02/03/2012 1046     2D Echocardiogram: 01/19/12 Study Conclusions - Left ventricle: The cavity size was normal. Systolic function was normal. The estimated ejection fraction was in the range of 55% to 60%. Wall motion was normal; there were no regional wall motion abnormalities. - Aortic valve: Mild regurgitation. - Left atrium: The atrium was mildly dilated. Transthoracic echocardiography. M-mode, complete 2D, spectral Doppler, and color Doppler. Height: Height: 167.6cm. Height: 66in. Weight: Weight: 90.7kg. Weight: 199.6lb. Body mass index: BMI: 32.3kg/m^2. Body surface area: BSA: 68m^2. Patient status: Outpatient. Location: Echo laboratory. Prepared and Electronically Authenticated by Georga Hacking, MD [facility]     Assessment:  Patient is a 55 year old [place] woman with metastatic HER-2/neu positive breast carcinoma. She is currently day 6 cycle 1 of every 3 week Kadcyla given in the palliative setting, evidence to  suggest possible improvement of symptomatology. 2. History of deep vein thrombosis of the right upper extremity with associated significant lymphedema on Coumadin therapy, with a subtherapeutic INR. 3. Persistent right upper arm lymphedema, active treatment to the lymphedema clinic.  Case reviewed with Dr. Pierce Crane, who also examined the patient.  Plan:  [name] will increase her Coumadin to 7.5 mg per day, repeat PT/INR and CBC scheduled 02/24/2012. Refill for her Phenergan/codeine cough suppressant was also provided. I will see her officially on 02/24/2012 in anticipation of day 1 cycle 2 of every 3 week Kadcyla in the palliative setting for progressive metastatic HER-2 positive breast cancer. This plan was reviewed with the patient, who voices understanding and agreement.  She knows to call with any changes or problems.    [name] T, PA-C 02/09/2012

## 2012-02-10 ENCOUNTER — Ambulatory Visit: Payer: Medicare Other | Admitting: Physical Therapy

## 2012-02-13 ENCOUNTER — Ambulatory Visit: Payer: Medicare Other | Admitting: Physical Therapy

## 2012-02-14 ENCOUNTER — Other Ambulatory Visit: Payer: Self-pay | Admitting: Oncology

## 2012-02-14 DIAGNOSIS — C50219 Malignant neoplasm of upper-inner quadrant of unspecified female breast: Secondary | ICD-10-CM

## 2012-02-15 ENCOUNTER — Ambulatory Visit: Payer: Medicare Other | Admitting: Physical Therapy

## 2012-02-16 ENCOUNTER — Other Ambulatory Visit: Payer: Self-pay | Admitting: Internal Medicine

## 2012-02-16 DIAGNOSIS — Z1231 Encounter for screening mammogram for malignant neoplasm of breast: Secondary | ICD-10-CM

## 2012-02-17 ENCOUNTER — Ambulatory Visit: Payer: Medicare Other | Admitting: Physical Therapy

## 2012-02-17 ENCOUNTER — Other Ambulatory Visit: Payer: Self-pay | Admitting: Oncology

## 2012-02-17 ENCOUNTER — Other Ambulatory Visit: Payer: Self-pay | Admitting: *Deleted

## 2012-02-17 ENCOUNTER — Other Ambulatory Visit: Payer: Medicare Other | Admitting: Lab

## 2012-02-17 DIAGNOSIS — I10 Essential (primary) hypertension: Secondary | ICD-10-CM

## 2012-02-17 DIAGNOSIS — I428 Other cardiomyopathies: Secondary | ICD-10-CM

## 2012-02-17 DIAGNOSIS — I82409 Acute embolism and thrombosis of unspecified deep veins of unspecified lower extremity: Secondary | ICD-10-CM

## 2012-02-17 DIAGNOSIS — C50919 Malignant neoplasm of unspecified site of unspecified female breast: Secondary | ICD-10-CM

## 2012-02-17 DIAGNOSIS — C50219 Malignant neoplasm of upper-inner quadrant of unspecified female breast: Secondary | ICD-10-CM

## 2012-02-17 LAB — PROTIME-INR
INR: 4.1 — ABNORMAL HIGH (ref 2.00–3.50)
Protime: 49.2 Seconds — ABNORMAL HIGH (ref 10.6–13.4)

## 2012-02-20 ENCOUNTER — Ambulatory Visit: Payer: Medicare Other

## 2012-02-22 ENCOUNTER — Ambulatory Visit: Payer: Medicare Other

## 2012-02-24 ENCOUNTER — Ambulatory Visit: Payer: Medicare Other | Admitting: Physical Therapy

## 2012-02-24 ENCOUNTER — Ambulatory Visit (HOSPITAL_BASED_OUTPATIENT_CLINIC_OR_DEPARTMENT_OTHER): Payer: Medicare Other

## 2012-02-24 ENCOUNTER — Encounter: Payer: Self-pay | Admitting: Physician Assistant

## 2012-02-24 ENCOUNTER — Other Ambulatory Visit (HOSPITAL_BASED_OUTPATIENT_CLINIC_OR_DEPARTMENT_OTHER): Payer: Medicare Other | Admitting: Lab

## 2012-02-24 ENCOUNTER — Telehealth: Payer: Self-pay | Admitting: Oncology

## 2012-02-24 ENCOUNTER — Ambulatory Visit (HOSPITAL_BASED_OUTPATIENT_CLINIC_OR_DEPARTMENT_OTHER): Payer: Medicare Other | Admitting: Physician Assistant

## 2012-02-24 VITALS — BP 110/74 | HR 79 | Temp 98.3°F | Ht 64.5 in | Wt 191.2 lb

## 2012-02-24 DIAGNOSIS — Z5112 Encounter for antineoplastic immunotherapy: Secondary | ICD-10-CM

## 2012-02-24 DIAGNOSIS — C50919 Malignant neoplasm of unspecified site of unspecified female breast: Secondary | ICD-10-CM

## 2012-02-24 DIAGNOSIS — C50219 Malignant neoplasm of upper-inner quadrant of unspecified female breast: Secondary | ICD-10-CM

## 2012-02-24 DIAGNOSIS — I82409 Acute embolism and thrombosis of unspecified deep veins of unspecified lower extremity: Secondary | ICD-10-CM

## 2012-02-24 DIAGNOSIS — I890 Lymphedema, not elsewhere classified: Secondary | ICD-10-CM

## 2012-02-24 LAB — COMPREHENSIVE METABOLIC PANEL
ALT: 19 U/L (ref 0–35)
AST: 35 U/L (ref 0–37)
Albumin: 4.3 g/dL (ref 3.5–5.2)
Alkaline Phosphatase: 95 U/L (ref 39–117)
BUN: 11 mg/dL (ref 6–23)
CO2: 32 mEq/L (ref 19–32)
Calcium: 9.7 mg/dL (ref 8.4–10.5)
Chloride: 100 mEq/L (ref 96–112)
Creatinine, Ser: 0.76 mg/dL (ref 0.50–1.10)
Glucose, Bld: 93 mg/dL (ref 70–99)
Potassium: 3.2 mEq/L — ABNORMAL LOW (ref 3.5–5.3)
Sodium: 140 mEq/L (ref 135–145)
Total Bilirubin: 0.3 mg/dL (ref 0.3–1.2)
Total Protein: 7.8 g/dL (ref 6.0–8.3)

## 2012-02-24 LAB — CBC WITH DIFFERENTIAL/PLATELET
BASO%: 1.2 % (ref 0.0–2.0)
Basophils Absolute: 0.1 10*3/uL (ref 0.0–0.1)
EOS%: 2.9 % (ref 0.0–7.0)
Eosinophils Absolute: 0.3 10*3/uL (ref 0.0–0.5)
HCT: 39.3 % (ref 34.8–46.6)
HGB: 13 g/dL (ref 11.6–15.9)
LYMPH%: 29.7 % (ref 14.0–49.7)
MCH: 27.3 pg (ref 25.1–34.0)
MCHC: 33.1 g/dL (ref 31.5–36.0)
MCV: 82.4 fL (ref 79.5–101.0)
MONO#: 0.8 10*3/uL (ref 0.1–0.9)
MONO%: 9.6 % (ref 0.0–14.0)
NEUT#: 4.9 10*3/uL (ref 1.5–6.5)
NEUT%: 56.6 % (ref 38.4–76.8)
Platelets: 260 10*3/uL (ref 145–400)
RBC: 4.77 10*6/uL (ref 3.70–5.45)
RDW: 16.9 % — ABNORMAL HIGH (ref 11.2–14.5)
WBC: 8.6 10*3/uL (ref 3.9–10.3)
lymph#: 2.6 10*3/uL (ref 0.9–3.3)

## 2012-02-24 LAB — PROTIME-INR
INR: 1.8 — ABNORMAL LOW (ref 2.00–3.50)
Protime: 21.6 Seconds — ABNORMAL HIGH (ref 10.6–13.4)

## 2012-02-24 LAB — LACTATE DEHYDROGENASE: LDH: 243 U/L (ref 94–250)

## 2012-02-24 LAB — CANCER ANTIGEN 27.29: CA 27.29: 71 U/mL — ABNORMAL HIGH (ref 0–39)

## 2012-02-24 MED ORDER — SODIUM CHLORIDE 0.9 % IV SOLN
3.6000 mg/kg | Freq: Once | INTRAVENOUS | Status: AC
  Administered 2012-02-24: 340 mg via INTRAVENOUS
  Filled 2012-02-24: qty 17

## 2012-02-24 MED ORDER — SODIUM CHLORIDE 0.9 % IV SOLN
Freq: Once | INTRAVENOUS | Status: AC
  Administered 2012-02-24: 14:00:00 via INTRAVENOUS

## 2012-02-24 MED ORDER — HEPARIN SOD (PORK) LOCK FLUSH 100 UNIT/ML IV SOLN
500.0000 [IU] | Freq: Once | INTRAVENOUS | Status: AC | PRN
  Administered 2012-02-24: 500 [IU]
  Filled 2012-02-24: qty 5

## 2012-02-24 MED ORDER — DIPHENHYDRAMINE HCL 25 MG PO CAPS
50.0000 mg | ORAL_CAPSULE | Freq: Once | ORAL | Status: AC
  Administered 2012-02-24: 50 mg via ORAL

## 2012-02-24 MED ORDER — SODIUM CHLORIDE 0.9 % IJ SOLN
10.0000 mL | INTRAMUSCULAR | Status: DC | PRN
  Administered 2012-02-24: 10 mL
  Filled 2012-02-24: qty 10

## 2012-02-24 MED ORDER — ACETAMINOPHEN 325 MG PO TABS
650.0000 mg | ORAL_TABLET | Freq: Once | ORAL | Status: AC
  Administered 2012-02-24: 650 mg via ORAL

## 2012-02-24 NOTE — Progress Notes (Signed)
Hematology and Oncology Follow Up Visit  [name] [id number] [birth year] 56 y.o. 02/24/2012    HPI: Patient is a 56 year old [place] woman with metastatic HER-2/neu positive breast carcinoma. She is currently day 1 cycle 2 of every 3 week Kadcyla given in the palliative setting, evidence to suggest possible improvement of symptomatology. 2. History of deep vein thrombosis of the right upper extremity with associated significant lymphedema on Coumadin therapy, with a subtherapeutic INR. 3. Persistent right upper arm lymphedema, active treatment to the lymphedema clinic.  Interim History:   [name] is seen today for followup prior to her second dose of Kadcyla for metastatic breast carcinoma. Her biggest complaint today is fatigue. She continues to be seen at the lymphedema clinic, which requires 3 appointments weekly, and she tells me this "makes her so tired". She did, however, tolerated her first cycle of Kadcyla well and is ready to proceed with her second dose today. She continues to cough, usually nonproductive. She denies any increased shortness of breath or chest pain. She denies any pain at the current time.    [name] continues on Coumadin, alternating between 7.5 and 5 mg daily. This was held over the weekend due to a supratherapeutic INR of 4.1 one week ago. She denies any signs of abnormal bleeding.  A detailed review of systems is otherwise noncontributory as noted below.   Review of Systems: Constitutional:  Positive for fatigue, no pain, no fever, chills or night sweats Eyes: no complaints ENT: no complaints Cardiovascular: no chest pain or dyspnea on exertion Respiratory: no worsening cough, increased shortness of breath, or wheezing Neurological: no TIA or stroke symptoms Dermatological: positive for lumps Gastrointestinal: no abdominal pain, nausea, change in bowel habits, or black or bloody stools Genito-Urinary: no dysuria, trouble voiding, or  hematuria Hematological and Lymphatic: negative Breast: negative Musculoskeletal: swelling in RUE Remaining ROS negative.   Medications:   I have reviewed the patient's current medications.  Current Outpatient Prescriptions  Medication Sig Dispense Refill  . ALPRAZolam (XANAX) 1 MG tablet Take 1 mg by mouth 3 (three) times daily as needed. For anxiety      . amLODipine (NORVASC) 10 MG tablet Take 10 mg by mouth daily.        . Ascorbic Acid (VITAMIN C) 100 MG tablet Take 100 mg by mouth daily.        . calcium-vitamin D (OSCAL WITH D) 500-200 MG-UNIT per tablet Take 1 tablet by mouth daily.        . hydrochlorothiazide (HYDRODIURIL) 25 MG tablet TAKE 1 TABLET BY MOUTH ON MONDAY, WEDNESDAY, AND FRIDAY  30 tablet  0  . ibuprofen (ADVIL,MOTRIN) 200 MG tablet Take 400 mg by mouth every 6 (six) hours as needed. For pain       . morphine (MS CONTIN) 15 MG 12 hr tablet Take 1 tablet (15 mg total) by mouth 2 (two) times daily.  60 tablet  0  . Multiple Vitamin (MULTIVITAMIN) capsule Take 1 capsule by mouth daily.        . ondansetron (ZOFRAN-ODT) 4 MG disintegrating tablet Take 4 mg by mouth every 8 (eight) hours as needed. For nausea       . warfarin (COUMADIN) 5 MG tablet Take 2 tablets (10 mg total) by mouth daily.  60 tablet  2   No current facility-administered medications for this visit.   Facility-Administered Medications Ordered in Other Visits  Medication Dose Route Frequency Provider Last Rate Last Dose  . 0.9 %  sodium chloride infusion   Intravenous Once Pierce Crane, MD 20 mL/hr at 02/24/12 1345    . acetaminophen (TYLENOL) tablet 650 mg  650 mg Oral Once Pierce Crane, MD   650 mg at 02/24/12 1351  . ado-trastuzumab emtansine (KADCYLA) 340 mg in sodium chloride 0.9 % 250 mL chemo infusion  3.6 mg/kg (Treatment Plan Actual) Intravenous Once Pierce Crane, MD 534 mL/hr at 02/24/12 1426 340 mg at 02/24/12 1426  . diphenhydrAMINE (BENADRYL) capsule 50 mg  50 mg Oral Once Pierce Crane, MD    50 mg at 02/24/12 1351  . heparin lock flush 100 unit/mL  500 Units Intracatheter Once PRN Pierce Crane, MD      . sodium chloride 0.9 % injection 10 mL  10 mL Intracatheter PRN Pierce Crane, MD        Allergies:  Allergies  Allergen Reactions  . Hydrocodone-Acetaminophen Itching and Rash    Physical Exam: Filed Vitals:   02/24/12 1256  BP: 110/74  Pulse: 79  Temp: 98.3 F (36.8 C)  Weight: 191  lbs. HEENT:  Sclerae anicteric, conjunctivae pink.  Oropharynx clear.  No mucositis or candidiasis.   Nodes: palpable lymphadenopathy in the right supraclavicular region  Breast Exam: Deferred.   Lungs:  Clear to auscultation without wheezing or rhonchi appreciated. Heart:  Regular rate and rhythm.   Abdomen:  Soft, nontender.  Positive bowel sounds.  No organomegaly or masses palpated.   Musculoskeletal:  No focal spinal tenderness to palpation.  Extremities: Obvious lymphedema of the right upper extremity, compression wrapping in place.  No additional peripheral edema noted. No peripheral cyanosis.  Skin:  Benign.   Neuro:  Nonfocal, alert and oriented x3 but restless on exam.   Lab Results: Lab Results  Component Value Date   WBC 8.6 02/24/2012   HGB 13.0 02/24/2012   HCT 39.3 02/24/2012   MCV 82.4 02/24/2012   PLT 260 02/24/2012   NEUTROABS 4.9 02/24/2012     Chemistry      Component Value Date/Time   NA 141 02/03/2012 1046   K 3.7 02/03/2012 1046   CL 105 02/03/2012 1046   CO2 27 02/03/2012 1046   BUN 10 02/03/2012 1046   CREATININE 0.79 02/03/2012 1046      Component Value Date/Time   CALCIUM 9.3 02/03/2012 1046   ALKPHOS 80 02/03/2012 1046   AST 19 02/03/2012 1046   ALT <8 02/03/2012 1046   BILITOT 0.3 02/03/2012 1046     2D Echocardiogram: 01/19/12 Study Conclusions - Left ventricle: The cavity size was normal. Systolic function was normal. The estimated ejection fraction was in the range of 55% to 60%. Wall motion was normal; there were no regional wall motion  abnormalities. - Aortic valve: Mild regurgitation. - Left atrium: The atrium was mildly dilated. Transthoracic echocardiography. M-mode, complete 2D, spectral Doppler, and color Doppler. Height: Height: 167.6cm. Height: 66in. Weight: Weight: 90.7kg. Weight: 199.6lb. Body mass index: BMI: 32.3kg/m^2. Body surface area: BSA: 32m^2. Patient status: Outpatient. Location: Echo laboratory. Prepared and Electronically Authenticated by Georga Hacking, MD [facility]     Assessment:  Patient is a 56 year old [place] woman with metastatic HER-2/neu positive breast carcinoma. She is currently day 1 cycle 2 of every 3 week Kadcyla given in the palliative setting, evidence to suggest possible improvement of symptomatology. 2. History of deep vein thrombosis of the right upper extremity with associated significant lymphedema on Coumadin therapy, with a subtherapeutic INR. 3. Persistent right upper arm lymphedema, active treatment to  the lymphedema clinic.  Case reviewed with Dr. Pierce Crane.  Plan:  Phyllicia will  proceed to treatment today as scheduled for her second cycle of every 3 week Kadcyla in the palliative setting. She'll continue alternating her Coumadin, 5 mg and 7.5 mg daily, and we will recheck labs when she returns for followup next week on April 26.  Plan was reviewed with the patient who voices understanding and agreement. She knows to call with any changes or problems prior to her next scheduled appointment.   Maayan Jenning, PA-C 02/24/2012

## 2012-02-24 NOTE — Telephone Encounter (Signed)
lmonvm adviisng the pt of her lab and md appts on 03/01/2012@10 :15am

## 2012-02-27 ENCOUNTER — Ambulatory Visit: Payer: Medicare Other | Admitting: Physical Therapy

## 2012-02-27 ENCOUNTER — Other Ambulatory Visit: Payer: Self-pay | Admitting: Physician Assistant

## 2012-02-29 ENCOUNTER — Ambulatory Visit: Payer: Medicare Other | Admitting: Physical Therapy

## 2012-02-29 ENCOUNTER — Ambulatory Visit: Payer: Self-pay

## 2012-03-01 ENCOUNTER — Ambulatory Visit (HOSPITAL_BASED_OUTPATIENT_CLINIC_OR_DEPARTMENT_OTHER): Payer: Medicare Other | Admitting: Physician Assistant

## 2012-03-01 ENCOUNTER — Other Ambulatory Visit (HOSPITAL_BASED_OUTPATIENT_CLINIC_OR_DEPARTMENT_OTHER): Payer: Medicare Other | Admitting: Lab

## 2012-03-01 ENCOUNTER — Encounter: Payer: Self-pay | Admitting: Physician Assistant

## 2012-03-01 VITALS — BP 123/79 | HR 96 | Temp 98.5°F | Ht 64.5 in | Wt 194.6 lb

## 2012-03-01 DIAGNOSIS — I890 Lymphedema, not elsewhere classified: Secondary | ICD-10-CM

## 2012-03-01 DIAGNOSIS — Z7901 Long term (current) use of anticoagulants: Secondary | ICD-10-CM

## 2012-03-01 DIAGNOSIS — C50219 Malignant neoplasm of upper-inner quadrant of unspecified female breast: Secondary | ICD-10-CM

## 2012-03-01 DIAGNOSIS — Z86718 Personal history of other venous thrombosis and embolism: Secondary | ICD-10-CM

## 2012-03-01 DIAGNOSIS — I82409 Acute embolism and thrombosis of unspecified deep veins of unspecified lower extremity: Secondary | ICD-10-CM

## 2012-03-01 DIAGNOSIS — C50919 Malignant neoplasm of unspecified site of unspecified female breast: Secondary | ICD-10-CM

## 2012-03-01 LAB — CBC WITH DIFFERENTIAL/PLATELET
BASO%: 0.7 % (ref 0.0–2.0)
Basophils Absolute: 0.1 10*3/uL (ref 0.0–0.1)
EOS%: 6.2 % (ref 0.0–7.0)
Eosinophils Absolute: 0.4 10*3/uL (ref 0.0–0.5)
HCT: 37.7 % (ref 34.8–46.6)
HGB: 12.4 g/dL (ref 11.6–15.9)
LYMPH%: 22.6 % (ref 14.0–49.7)
MCH: 27 pg (ref 25.1–34.0)
MCHC: 32.9 g/dL (ref 31.5–36.0)
MCV: 82.1 fL (ref 79.5–101.0)
MONO#: 1.6 10*3/uL — ABNORMAL HIGH (ref 0.1–0.9)
MONO%: 22 % — ABNORMAL HIGH (ref 0.0–14.0)
NEUT#: 3.5 10*3/uL (ref 1.5–6.5)
NEUT%: 48.5 % (ref 38.4–76.8)
Platelets: 78 10*3/uL — ABNORMAL LOW (ref 145–400)
RBC: 4.59 10*6/uL (ref 3.70–5.45)
RDW: 16.8 % — ABNORMAL HIGH (ref 11.2–14.5)
WBC: 7.1 10*3/uL (ref 3.9–10.3)
lymph#: 1.6 10*3/uL (ref 0.9–3.3)
nRBC: 0 % (ref 0–0)

## 2012-03-01 LAB — PROTIME-INR
INR: 1.8 — ABNORMAL LOW (ref 2.00–3.50)
Protime: 21.6 Seconds — ABNORMAL HIGH (ref 10.6–13.4)

## 2012-03-01 NOTE — Progress Notes (Signed)
Hematology and Oncology Follow Up Visit  [name] [id number] [birth year] 56 y.o. 03/01/2012    HPI: Patient is a 56 year old [place] woman with metastatic HER-2/neu positive breast carcinoma. She is currently day 6 cycle 2 of every 3 week Kadcyla given in the palliative setting, evidence to suggest possible improvement of symptomatology. 2. History of deep vein thrombosis of the right upper extremity with associated significant lymphedema on Coumadin therapy, with a subtherapeutic INR. 3. Persistent right upper arm lymphedema, active treatment to the lymphedema clinic.  Interim History:   [name] is seen today for followup after her second cycle of palliative every 3 week Kadcyla.  She continues to improve. She is doing quite well to the lymphedema clinic, stating that the diameter of her right upper extremity has decreased by "42%". She is not requiring any significant pain control methods. She continues on Coumadin 5 mg per day, she is aware that her INR is 1.4 today Will increase it to 7.5 respectively. She denies any abnormal bleeding or bruising symptoms. She does have an occasional productive cough, but she denies any significant color to it. She described a low-grade temperature 100.4 on 02/24/2029. She has had none since. He denies any nausea, emesis, diarrhea or constipation issues.  A detailed review of systems is otherwise noncontributory as noted below.  Review of Systems: Constitutional:  no weight loss, fever, night sweats and fatigued Eyes: no complaints ENT: no complaints Cardiovascular: no chest pain or dyspnea on exertion Respiratory: no cough, shortness of breath, or wheezing Neurological: no TIA or stroke symptoms Dermatological: positive for lumps Gastrointestinal: no abdominal pain, change in bowel habits, or black or bloody stools Genito-Urinary: no dysuria, trouble voiding, or hematuria Hematological and Lymphatic: negative Breast:  negative Musculoskeletal: negative Remaining ROS negative.   Medications:   I have reviewed the patient's current medications.  Current Outpatient Prescriptions  Medication Sig Dispense Refill  . ALPRAZolam (XANAX) 1 MG tablet Take 1 mg by mouth 3 (three) times daily as needed. For anxiety      . amLODipine (NORVASC) 10 MG tablet Take 10 mg by mouth daily.        . Ascorbic Acid (VITAMIN C) 100 MG tablet Take 100 mg by mouth daily.        . calcium-vitamin D (OSCAL WITH D) 500-200 MG-UNIT per tablet Take 1 tablet by mouth daily.        . hydrochlorothiazide (HYDRODIURIL) 25 MG tablet TAKE 1 TABLET BY MOUTH ON MONDAY, WEDNESDAY, AND FRIDAY  30 tablet  0  . ibuprofen (ADVIL,MOTRIN) 200 MG tablet Take 400 mg by mouth every 6 (six) hours as needed. For pain       . morphine (MS CONTIN) 15 MG 12 hr tablet Take 1 tablet (15 mg total) by mouth 2 (two) times daily.  60 tablet  0  . Multiple Vitamin (MULTIVITAMIN) capsule Take 1 capsule by mouth daily.        . ondansetron (ZOFRAN-ODT) 4 MG disintegrating tablet Take 4 mg by mouth every 8 (eight) hours as needed. For nausea       . warfarin (COUMADIN) 5 MG tablet Take 2 tablets (10 mg total) by mouth daily.  60 tablet  2    Allergies:  Allergies  Allergen Reactions  . Hydrocodone-Acetaminophen Itching and Rash    Physical Exam: Filed Vitals:   03/01/12 1013  BP: 123/79  Pulse: 96  Temp: 98.5 F (36.9 C)  Weight: 194 lbs. HEENT:  Sclerae anicteric, conjunctivae pink.  Oropharynx clear.  No mucositis or candidiasis.   Nodes: The right upper anterior chest wall has significantly improved, in fact, in the posterior region I really unable to feel any subcutaneous nodules. Breast Exam: Deferred.   Lungs:  Clear to auscultation without wheezing or rhonchi appreciated. Heart:  Regular rate and rhythm.   Abdomen:  Soft, nontender.  Positive bowel sounds.  No organomegaly or masses palpated.   Musculoskeletal:  No focal spinal tenderness to  palpation.  Extremities: Obvious lymphedema of the right upper extremity, compression wrapping in place, with definite decrease in diameter. Skin:  Benign.   Neuro:  Nonfocal, essentially alert and oriented though patient appears distracted.  Lab Results: Lab Results  Component Value Date   WBC 7.1 03/01/2012   HGB 12.4 03/01/2012   HCT 37.7 03/01/2012   MCV 82.1 03/01/2012   PLT 78* 03/01/2012   NEUTROABS 3.5 03/01/2012     Chemistry      Component Value Date/Time   NA 140 02/24/2012 1218   K 3.2* 02/24/2012 1218   CL 100 02/24/2012 1218   CO2 32 02/24/2012 1218   BUN 11 02/24/2012 1218   CREATININE 0.76 02/24/2012 1218      Component Value Date/Time   CALCIUM 9.7 02/24/2012 1218   ALKPHOS 95 02/24/2012 1218   AST 35 02/24/2012 1218   ALT 19 02/24/2012 1218   BILITOT 0.3 02/24/2012 1218     2D Echocardiogram: 01/19/12 Study Conclusions - Left ventricle: The cavity size was normal. Systolic function was normal. The estimated ejection fraction was in the range of 55% to 60%. Wall motion was normal; there were no regional wall motion abnormalities. - Aortic valve: Mild regurgitation. - Left atrium: The atrium was mildly dilated. Transthoracic echocardiography. M-mode, complete 2D, spectral Doppler, and color Doppler. Height: Height: 167.6cm. Height: 66in. Weight: Weight: 90.7kg. Weight: 199.6lb. Body mass index: BMI: 32.3kg/m^2. Body surface area: BSA: 22m^2. Patient status: Outpatient. Location: Echo laboratory. Prepared and Electronically Authenticated by Georga Hacking, MD [facility]     Assessment:  Patient is a 56 year old [place] woman with metastatic HER-2/neu positive breast carcinoma. She is currently day 6 cycle 2 of every 3 week Kadcyla given in the palliative setting, evidence to suggest possible improvement of symptomatology. 2. History of deep vein thrombosis of the right upper extremity with associated significant lymphedema on Coumadin therapy, with a  subtherapeutic INR. 3. Persistent right upper arm lymphedema, active treatment to the lymphedema clinic, with significant improvement noted.  Case reviewed with Dr. Pierce Crane, who also examined the patient.  Plan:  [name] will increase her Coumadin to 7.5 mg per day.  Refill for her Phenergan/codeine cough suppressant was also provided. I will see her officially on 03/15/12 in anticipation of day 1 cycle 3 of every 3 week Kadcyla in the palliative setting for progressive metastatic HER-2 positive breast cancer. This plan was reviewed with the patient, who voices understanding and agreement.  She knows to call with any changes or problems.    Analyse Angst T, PA-C 03/01/2012

## 2012-03-02 ENCOUNTER — Other Ambulatory Visit: Payer: Medicare Other | Admitting: Lab

## 2012-03-02 ENCOUNTER — Ambulatory Visit: Payer: Medicare Other | Admitting: Physical Therapy

## 2012-03-05 ENCOUNTER — Other Ambulatory Visit: Payer: Self-pay | Admitting: Hematology & Oncology

## 2012-03-05 ENCOUNTER — Ambulatory Visit: Payer: Medicare Other

## 2012-03-06 ENCOUNTER — Telehealth: Payer: Self-pay | Admitting: *Deleted

## 2012-03-06 ENCOUNTER — Telehealth: Payer: Self-pay | Admitting: Oncology

## 2012-03-06 NOTE — Telephone Encounter (Signed)
lmonvm adviisng the pt of her next appts and to pick up her schedules at that time

## 2012-03-06 NOTE — Telephone Encounter (Signed)
Per staff message from [place], I have scheduled treatments for the patient. Crystal aware appts in the computer.   JMW

## 2012-03-07 ENCOUNTER — Ambulatory Visit: Payer: Medicare Other | Attending: Physician Assistant

## 2012-03-07 DIAGNOSIS — IMO0001 Reserved for inherently not codable concepts without codable children: Secondary | ICD-10-CM | POA: Insufficient documentation

## 2012-03-07 DIAGNOSIS — M25619 Stiffness of unspecified shoulder, not elsewhere classified: Secondary | ICD-10-CM | POA: Insufficient documentation

## 2012-03-07 DIAGNOSIS — M6281 Muscle weakness (generalized): Secondary | ICD-10-CM | POA: Insufficient documentation

## 2012-03-12 ENCOUNTER — Ambulatory Visit: Payer: Medicare Other | Admitting: Physical Therapy

## 2012-03-14 ENCOUNTER — Ambulatory Visit: Payer: Medicare Other | Admitting: Physical Therapy

## 2012-03-15 ENCOUNTER — Other Ambulatory Visit (HOSPITAL_BASED_OUTPATIENT_CLINIC_OR_DEPARTMENT_OTHER): Payer: Medicare Other | Admitting: Lab

## 2012-03-15 ENCOUNTER — Ambulatory Visit (HOSPITAL_BASED_OUTPATIENT_CLINIC_OR_DEPARTMENT_OTHER): Payer: Medicare Other | Admitting: Physician Assistant

## 2012-03-15 ENCOUNTER — Ambulatory Visit (HOSPITAL_BASED_OUTPATIENT_CLINIC_OR_DEPARTMENT_OTHER): Payer: Medicare Other

## 2012-03-15 VITALS — BP 115/67 | HR 77 | Temp 98.3°F | Ht 64.5 in | Wt 192.7 lb

## 2012-03-15 DIAGNOSIS — I82409 Acute embolism and thrombosis of unspecified deep veins of unspecified lower extremity: Secondary | ICD-10-CM

## 2012-03-15 DIAGNOSIS — C50919 Malignant neoplasm of unspecified site of unspecified female breast: Secondary | ICD-10-CM

## 2012-03-15 DIAGNOSIS — C50911 Malignant neoplasm of unspecified site of right female breast: Secondary | ICD-10-CM

## 2012-03-15 DIAGNOSIS — Z5111 Encounter for antineoplastic chemotherapy: Secondary | ICD-10-CM

## 2012-03-15 DIAGNOSIS — Z7901 Long term (current) use of anticoagulants: Secondary | ICD-10-CM

## 2012-03-15 LAB — CBC WITH DIFFERENTIAL/PLATELET
BASO%: 0.6 % (ref 0.0–2.0)
Basophils Absolute: 0.1 10*3/uL (ref 0.0–0.1)
EOS%: 2.4 % (ref 0.0–7.0)
Eosinophils Absolute: 0.3 10*3/uL (ref 0.0–0.5)
HCT: 38.9 % (ref 34.8–46.6)
HGB: 12.6 g/dL (ref 11.6–15.9)
LYMPH%: 23.8 % (ref 14.0–49.7)
MCH: 26.5 pg (ref 25.1–34.0)
MCHC: 32.4 g/dL (ref 31.5–36.0)
MCV: 81.9 fL (ref 79.5–101.0)
MONO#: 0.8 10*3/uL (ref 0.1–0.9)
MONO%: 6.7 % (ref 0.0–14.0)
NEUT#: 7.9 10*3/uL — ABNORMAL HIGH (ref 1.5–6.5)
NEUT%: 66.5 % (ref 38.4–76.8)
Platelets: 372 10*3/uL (ref 145–400)
RBC: 4.75 10*6/uL (ref 3.70–5.45)
RDW: 16.7 % — ABNORMAL HIGH (ref 11.2–14.5)
WBC: 11.9 10*3/uL — ABNORMAL HIGH (ref 3.9–10.3)
lymph#: 2.8 10*3/uL (ref 0.9–3.3)
nRBC: 0 % (ref 0–0)

## 2012-03-15 LAB — COMPREHENSIVE METABOLIC PANEL
ALT: 16 U/L (ref 0–35)
AST: 31 U/L (ref 0–37)
Albumin: 4.1 g/dL (ref 3.5–5.2)
Alkaline Phosphatase: 93 U/L (ref 39–117)
BUN: 11 mg/dL (ref 6–23)
CO2: 27 mEq/L (ref 19–32)
Calcium: 9.7 mg/dL (ref 8.4–10.5)
Chloride: 100 mEq/L (ref 96–112)
Creatinine, Ser: 0.8 mg/dL (ref 0.50–1.10)
Glucose, Bld: 122 mg/dL — ABNORMAL HIGH (ref 70–99)
Potassium: 3.3 mEq/L — ABNORMAL LOW (ref 3.5–5.3)
Sodium: 139 mEq/L (ref 135–145)
Total Bilirubin: 0.3 mg/dL (ref 0.3–1.2)
Total Protein: 7.9 g/dL (ref 6.0–8.3)

## 2012-03-15 LAB — LACTATE DEHYDROGENASE: LDH: 244 U/L (ref 94–250)

## 2012-03-15 LAB — PROTIME-INR
INR: 1.9 — ABNORMAL LOW (ref 2.00–3.50)
Protime: 22.8 Seconds — ABNORMAL HIGH (ref 10.6–13.4)

## 2012-03-15 LAB — CANCER ANTIGEN 27.29: CA 27.29: 42 U/mL — ABNORMAL HIGH (ref 0–39)

## 2012-03-15 MED ORDER — DIPHENHYDRAMINE HCL 25 MG PO CAPS
50.0000 mg | ORAL_CAPSULE | Freq: Once | ORAL | Status: AC
  Administered 2012-03-15: 50 mg via ORAL

## 2012-03-15 MED ORDER — SODIUM CHLORIDE 0.9 % IJ SOLN
10.0000 mL | INTRAMUSCULAR | Status: DC | PRN
  Administered 2012-03-15: 10 mL
  Filled 2012-03-15: qty 10

## 2012-03-15 MED ORDER — SODIUM CHLORIDE 0.9 % IV SOLN
320.0000 mg | Freq: Once | INTRAVENOUS | Status: AC
  Administered 2012-03-15: 320 mg via INTRAVENOUS
  Filled 2012-03-15: qty 16

## 2012-03-15 MED ORDER — HEPARIN SOD (PORK) LOCK FLUSH 100 UNIT/ML IV SOLN
500.0000 [IU] | Freq: Once | INTRAVENOUS | Status: AC | PRN
  Administered 2012-03-15: 500 [IU]
  Filled 2012-03-15: qty 5

## 2012-03-15 MED ORDER — ACETAMINOPHEN 325 MG PO TABS
650.0000 mg | ORAL_TABLET | Freq: Once | ORAL | Status: AC
  Administered 2012-03-15: 650 mg via ORAL

## 2012-03-15 MED ORDER — SODIUM CHLORIDE 0.9 % IV SOLN
Freq: Once | INTRAVENOUS | Status: AC
  Administered 2012-03-15: 14:00:00 via INTRAVENOUS

## 2012-03-15 NOTE — Progress Notes (Signed)
Hematology and Oncology Follow Up Visit  [name] [id number] [birth year] 56 y.o. 03/15/2012    HPI: Patient is a 56 year old [place] woman with metastatic HER-2/neu positive breast carcinoma. She is for day 1 cycle 3 of every 3 week Kadcyla given in the palliative setting, evidence to suggest possible improvement of symptomatology.  2. History of deep vein thrombosis of the right upper extremity with associated significant lymphedema on Coumadin therapy 7.5mg  daily.  3. Persistent right upper arm lymphedema, active treatment to the lymphedema clinic.  Interim History:   [name] is seen today for followup prior to her third cycle of palliative every 3 week Kadcyla.  She continues to improve. She is doing quite well to the lymphedema clinic, in fact, she  She now has her new lymphedema compression garment. She denies any abnormal bleeding or bruising symptoms. She does have an occasional productive cough, but she denies any significant color to it.  She denies any nausea, emesis, diarrhea or constipation issues.  A detailed review of systems is otherwise noncontributory as noted below.  Review of Systems: Constitutional:  no weight loss, fever, night sweats and fatigued Eyes: no complaints ENT: no complaints Cardiovascular: no chest pain or dyspnea on exertion Respiratory: no cough, shortness of breath, or wheezing Neurological: no TIA or stroke symptoms Dermatological: positive for lumps Gastrointestinal: no abdominal pain, change in bowel habits, or black or bloody stools Genito-Urinary: no dysuria, trouble voiding, or hematuria Hematological and Lymphatic: negative Breast: negative Musculoskeletal: negative Remaining ROS negative.   Medications:   I have reviewed the patient's current medications.  Current Outpatient Prescriptions  Medication Sig Dispense Refill  . ALPRAZolam (XANAX) 1 MG tablet Take 1 mg by mouth 3 (three) times daily as needed. For anxiety       . amLODipine (NORVASC) 10 MG tablet Take 10 mg by mouth daily.        . Ascorbic Acid (VITAMIN C) 100 MG tablet Take 100 mg by mouth daily.        . calcium-vitamin D (OSCAL WITH D) 500-200 MG-UNIT per tablet Take 1 tablet by mouth daily.        . hydrochlorothiazide (HYDRODIURIL) 25 MG tablet TAKE 1 TABLET BY MOUTH ON MONDAY, WEDNESDAY, AND FRIDAY  30 tablet  0  . ibuprofen (ADVIL,MOTRIN) 200 MG tablet Take 400 mg by mouth every 6 (six) hours as needed. For pain       . Multiple Vitamin (MULTIVITAMIN) capsule Take 1 capsule by mouth daily.        . ondansetron (ZOFRAN-ODT) 4 MG disintegrating tablet Take 4 mg by mouth every 8 (eight) hours as needed. For nausea       . warfarin (COUMADIN) 5 MG tablet Take 2 tablets (10 mg total) by mouth daily.  60 tablet  2    Allergies:  Allergies  Allergen Reactions  . Hydrocodone-Acetaminophen Itching and Rash    Physical Exam: Filed Vitals:   03/15/12 1352  BP: 115/67  Pulse: 77  Temp: 98.3 F (36.8 C)  Weight: 194 lbs. HEENT:  Sclerae anicteric, conjunctivae pink.  Oropharynx clear.  No mucositis or candidiasis.   Nodes: The right upper anterior chest wall continues to significantly improve.  I really am unable to feel any subcutaneous nodules, more like discrete thickening. Breast Exam: Deferred.   Lungs:  Clear to auscultation without wheezing or rhonchi appreciated. Heart:  Regular rate and rhythm.   Abdomen:  Soft, nontender.  Positive bowel sounds.  No organomegaly or masses  palpated.   Musculoskeletal:  No focal spinal tenderness to palpation.  Extremities: Evidence to suggest significant improvement of right arm lymphedema. Skin:  Benign.   Neuro:  Nonfocal, essentially alert and oriented though patient appears distracted.  Lab Results: Lab Results  Component Value Date   WBC 11.9* 03/15/2012   HGB 12.6 03/15/2012   HCT 38.9 03/15/2012   MCV 81.9 03/15/2012   PLT 372 03/15/2012   NEUTROABS 7.9* 03/15/2012     Chemistry        Component Value Date/Time   NA 140 02/24/2012 1218   K 3.2* 02/24/2012 1218   CL 100 02/24/2012 1218   CO2 32 02/24/2012 1218   BUN 11 02/24/2012 1218   CREATININE 0.76 02/24/2012 1218      Component Value Date/Time   CALCIUM 9.7 02/24/2012 1218   ALKPHOS 95 02/24/2012 1218   AST 35 02/24/2012 1218   ALT 19 02/24/2012 1218   BILITOT 0.3 02/24/2012 1218     2D Echocardiogram: 01/19/12 Study Conclusions - Left ventricle: The cavity size was normal. Systolic function was normal. The estimated ejection fraction was in the range of 55% to 60%. Wall motion was normal; there were no regional wall motion abnormalities. - Aortic valve: Mild regurgitation. - Left atrium: The atrium was mildly dilated. Transthoracic echocardiography. M-mode, complete 2D, spectral Doppler, and color Doppler. Height: Height: 167.6cm. Height: 66in. Weight: Weight: 90.7kg. Weight: 199.6lb. Body mass index: BMI: 32.3kg/m^2. Body surface area: BSA: 80m^2. Patient status: Outpatient. Location: Echo laboratory. Prepared and Electronically Authenticated by Georga Hacking, MD [facility]     Assessment:  Patient is a 56 year old [place] woman with metastatic HER-2/neu positive breast carcinoma. She is for day 1 cycle 3 of every 3 week Kadcyla given in the palliative setting, evidence of profound disease regression.  2. History of deep vein thrombosis of the right upper extremity with associated significant lymphedema on Coumadin 7.5mg  daily with a slightly subtherapeutic INR.  3. Persistent right upper arm lymphedema, active treatment to the lymphedema clinic, with significant improvement noted.  Case to be reviewed with Dr. Pierce Crane.  Plan:  [name] proceed to receive day 1 cycle 3 of every 3 week Kadcyla in the palliative setting for progressive metastatic HER-2 positive breast cancer.  She will continue on her current dose of Coumadin at 7.5 mg. I will see her in one week's time for nadir  assessment to include CBC/PT/INR. This plan was reviewed with the patient, who voices understanding and agreement.  She knows to call with any changes or problems.    Alekxander Isola T, PA-C 03/15/2012

## 2012-03-16 ENCOUNTER — Encounter: Payer: Self-pay | Admitting: *Deleted

## 2012-03-16 NOTE — Progress Notes (Unsigned)
Pt reports that she is going out of town next week and will not be returning until Sunday 03/25/12. Pt needs to r/s her appt from CS to MD scheduling has been notified

## 2012-03-20 ENCOUNTER — Other Ambulatory Visit: Payer: Self-pay | Admitting: *Deleted

## 2012-03-20 DIAGNOSIS — I82409 Acute embolism and thrombosis of unspecified deep veins of unspecified lower extremity: Secondary | ICD-10-CM

## 2012-03-20 MED ORDER — WARFARIN SODIUM 5 MG PO TABS: 10.0000 mg | ORAL_TABLET | Freq: Every day | ORAL | Status: DC

## 2012-03-22 ENCOUNTER — Ambulatory Visit: Payer: Medicare Other | Admitting: Physician Assistant

## 2012-03-22 ENCOUNTER — Other Ambulatory Visit: Payer: Medicare Other | Admitting: Lab

## 2012-03-26 ENCOUNTER — Ambulatory Visit: Payer: Medicare Other | Admitting: Oncology

## 2012-03-26 ENCOUNTER — Telehealth: Payer: Self-pay | Admitting: *Deleted

## 2012-03-26 ENCOUNTER — Other Ambulatory Visit: Payer: Medicare Other | Admitting: Lab

## 2012-03-26 NOTE — Telephone Encounter (Signed)
Called patient to make her aware that Dr. Donnie Coffin is sick and will not be in the office today.  Told her that someone will call her back to reschedule.  She requests for [name] to leave appt on voicemail if she is not there.  She understands.

## 2012-04-05 ENCOUNTER — Ambulatory Visit (HOSPITAL_BASED_OUTPATIENT_CLINIC_OR_DEPARTMENT_OTHER): Payer: Medicare Other

## 2012-04-05 ENCOUNTER — Telehealth: Payer: Self-pay | Admitting: *Deleted

## 2012-04-05 ENCOUNTER — Other Ambulatory Visit (HOSPITAL_BASED_OUTPATIENT_CLINIC_OR_DEPARTMENT_OTHER): Payer: Medicare Other | Admitting: Lab

## 2012-04-05 DIAGNOSIS — I428 Other cardiomyopathies: Secondary | ICD-10-CM

## 2012-04-05 DIAGNOSIS — C50919 Malignant neoplasm of unspecified site of unspecified female breast: Secondary | ICD-10-CM

## 2012-04-05 DIAGNOSIS — Z5112 Encounter for antineoplastic immunotherapy: Secondary | ICD-10-CM

## 2012-04-05 DIAGNOSIS — Z7901 Long term (current) use of anticoagulants: Secondary | ICD-10-CM

## 2012-04-05 DIAGNOSIS — I10 Essential (primary) hypertension: Secondary | ICD-10-CM

## 2012-04-05 DIAGNOSIS — I82409 Acute embolism and thrombosis of unspecified deep veins of unspecified lower extremity: Secondary | ICD-10-CM

## 2012-04-05 LAB — CBC WITH DIFFERENTIAL/PLATELET
BASO%: 0.5 % (ref 0.0–2.0)
Basophils Absolute: 0 10*3/uL (ref 0.0–0.1)
EOS%: 4.1 % (ref 0.0–7.0)
Eosinophils Absolute: 0.4 10*3/uL (ref 0.0–0.5)
HCT: 37 % (ref 34.8–46.6)
HGB: 12.1 g/dL (ref 11.6–15.9)
LYMPH%: 27.6 % (ref 14.0–49.7)
MCH: 26.7 pg (ref 25.1–34.0)
MCHC: 32.7 g/dL (ref 31.5–36.0)
MCV: 81.5 fL (ref 79.5–101.0)
MONO#: 0.8 10*3/uL (ref 0.1–0.9)
MONO%: 9.1 % (ref 0.0–14.0)
NEUT#: 5.2 10*3/uL (ref 1.5–6.5)
NEUT%: 58.7 % (ref 38.4–76.8)
Platelets: 383 10*3/uL (ref 145–400)
RBC: 4.54 10*6/uL (ref 3.70–5.45)
RDW: 18 % — ABNORMAL HIGH (ref 11.2–14.5)
WBC: 8.9 10*3/uL (ref 3.9–10.3)
lymph#: 2.5 10*3/uL (ref 0.9–3.3)

## 2012-04-05 LAB — COMPREHENSIVE METABOLIC PANEL
ALT: 20 U/L (ref 0–35)
AST: 39 U/L — ABNORMAL HIGH (ref 0–37)
Albumin: 4.1 g/dL (ref 3.5–5.2)
Alkaline Phosphatase: 82 U/L (ref 39–117)
BUN: 9 mg/dL (ref 6–23)
CO2: 27 mEq/L (ref 19–32)
Calcium: 9.3 mg/dL (ref 8.4–10.5)
Chloride: 102 mEq/L (ref 96–112)
Creatinine, Ser: 0.84 mg/dL (ref 0.50–1.10)
Glucose, Bld: 90 mg/dL (ref 70–99)
Potassium: 3.4 mEq/L — ABNORMAL LOW (ref 3.5–5.3)
Sodium: 140 mEq/L (ref 135–145)
Total Bilirubin: 0.3 mg/dL (ref 0.3–1.2)
Total Protein: 7.7 g/dL (ref 6.0–8.3)

## 2012-04-05 LAB — PROTIME-INR
INR: 4 — ABNORMAL HIGH (ref 2.00–3.50)
Protime: 48 Seconds — ABNORMAL HIGH (ref 10.6–13.4)

## 2012-04-05 LAB — LACTATE DEHYDROGENASE: LDH: 250 U/L (ref 94–250)

## 2012-04-05 LAB — CANCER ANTIGEN 27.29: CA 27.29: 38 U/mL (ref 0–39)

## 2012-04-05 MED ORDER — SODIUM CHLORIDE 0.9 % IV SOLN
320.0000 mg | Freq: Once | INTRAVENOUS | Status: AC
  Administered 2012-04-05: 320 mg via INTRAVENOUS
  Filled 2012-04-05: qty 16

## 2012-04-05 MED ORDER — SODIUM CHLORIDE 0.9 % IJ SOLN
10.0000 mL | INTRAMUSCULAR | Status: DC | PRN
  Administered 2012-04-05: 10 mL
  Filled 2012-04-05: qty 10

## 2012-04-05 MED ORDER — ACETAMINOPHEN 325 MG PO TABS
650.0000 mg | ORAL_TABLET | Freq: Once | ORAL | Status: AC
  Administered 2012-04-05: 650 mg via ORAL

## 2012-04-05 MED ORDER — HEPARIN SOD (PORK) LOCK FLUSH 100 UNIT/ML IV SOLN
500.0000 [IU] | Freq: Once | INTRAVENOUS | Status: AC | PRN
  Administered 2012-04-05: 500 [IU]
  Filled 2012-04-05: qty 5

## 2012-04-05 MED ORDER — DIPHENHYDRAMINE HCL 25 MG PO CAPS
50.0000 mg | ORAL_CAPSULE | Freq: Once | ORAL | Status: AC
  Administered 2012-04-05: 50 mg via ORAL

## 2012-04-05 MED ORDER — SODIUM CHLORIDE 0.9 % IV SOLN
Freq: Once | INTRAVENOUS | Status: AC
  Administered 2012-04-05: 11:00:00 via INTRAVENOUS

## 2012-04-05 NOTE — Telephone Encounter (Signed)
Called patient and per Dr. Donnie Coffin, she is to decrease her coumadin dose to 5mg . Daily.  She has 5mg  tablets at home.  She is to see Verdis Prime next Thursday and she is aware of this.

## 2012-04-12 ENCOUNTER — Other Ambulatory Visit (HOSPITAL_BASED_OUTPATIENT_CLINIC_OR_DEPARTMENT_OTHER): Payer: Medicare Other | Admitting: Lab

## 2012-04-12 ENCOUNTER — Telehealth: Payer: Self-pay | Admitting: *Deleted

## 2012-04-12 ENCOUNTER — Ambulatory Visit (HOSPITAL_BASED_OUTPATIENT_CLINIC_OR_DEPARTMENT_OTHER): Payer: Medicare Other | Admitting: Physician Assistant

## 2012-04-12 ENCOUNTER — Other Ambulatory Visit: Payer: Self-pay | Admitting: *Deleted

## 2012-04-12 ENCOUNTER — Encounter: Payer: Self-pay | Admitting: Physician Assistant

## 2012-04-12 VITALS — BP 103/71 | HR 94 | Temp 98.3°F | Ht 64.5 in | Wt 185.2 lb

## 2012-04-12 DIAGNOSIS — I82409 Acute embolism and thrombosis of unspecified deep veins of unspecified lower extremity: Secondary | ICD-10-CM

## 2012-04-12 DIAGNOSIS — Z86718 Personal history of other venous thrombosis and embolism: Secondary | ICD-10-CM

## 2012-04-12 DIAGNOSIS — I890 Lymphedema, not elsewhere classified: Secondary | ICD-10-CM

## 2012-04-12 DIAGNOSIS — I10 Essential (primary) hypertension: Secondary | ICD-10-CM

## 2012-04-12 DIAGNOSIS — C50919 Malignant neoplasm of unspecified site of unspecified female breast: Secondary | ICD-10-CM

## 2012-04-12 LAB — CBC WITH DIFFERENTIAL/PLATELET
BASO%: 0.6 % (ref 0.0–2.0)
Basophils Absolute: 0 10*3/uL (ref 0.0–0.1)
EOS%: 3.5 % (ref 0.0–7.0)
Eosinophils Absolute: 0.3 10*3/uL (ref 0.0–0.5)
HCT: 36.9 % (ref 34.8–46.6)
HGB: 12.1 g/dL (ref 11.6–15.9)
LYMPH%: 26.3 % (ref 14.0–49.7)
MCH: 26.4 pg (ref 25.1–34.0)
MCHC: 32.8 g/dL (ref 31.5–36.0)
MCV: 80.6 fL (ref 79.5–101.0)
MONO#: 0.7 10*3/uL (ref 0.1–0.9)
MONO%: 10.3 % (ref 0.0–14.0)
NEUT#: 4.2 10*3/uL (ref 1.5–6.5)
NEUT%: 59.3 % (ref 38.4–76.8)
Platelets: 125 10*3/uL — ABNORMAL LOW (ref 145–400)
RBC: 4.58 10*6/uL (ref 3.70–5.45)
RDW: 17.6 % — ABNORMAL HIGH (ref 11.2–14.5)
WBC: 7.1 10*3/uL (ref 3.9–10.3)
lymph#: 1.9 10*3/uL (ref 0.9–3.3)
nRBC: 0 % (ref 0–0)

## 2012-04-12 LAB — PROTIME-INR
INR: 1.7 — ABNORMAL LOW (ref 2.00–3.50)
Protime: 20.4 Seconds — ABNORMAL HIGH (ref 10.6–13.4)

## 2012-04-12 LAB — TECHNOLOGIST REVIEW

## 2012-04-12 NOTE — Progress Notes (Signed)
Hematology and Oncology Follow Up Visit  [name] [id number] [birth year] 56 y.o. 04/12/2012    HPI: Patient is a 56 year old [place] woman with metastatic HER-2/neu positive breast carcinoma. She is currently  day 7 cycle 4 of every 3 week Kadcyla given in the palliative setting, evidence to suggest possible improvement of symptomatology.  2. History of deep vein thrombosis of the right upper extremity with associated significant lymphedema on Coumadin therapy 5 mg daily.  3. Persistent right upper arm lymphedema, active treatment to the lymphedema clinic.  Interim History:   [name] is seen today for followup after her fourth cycle of palliative every 3 week Kadcyla.  She continues to improve. She is doing quite well to the lymphedema clinic, in fact, she  She now has her new lymphedema compression garment. She denies any abnormal bleeding or bruising symptoms. She does have an occasional productive cough, but she denies any significant color to it.  She denies any nausea, emesis, diarrhea or constipation issues. She does admit to some numbness and tingling of the right upper extremity at the fingertips. She also is a bit emotionally fragile this point, the back does not wish to consider an antidepressant at this moment. No evidence of suicidal idealization. A detailed review of systems is otherwise noncontributory as noted below.  Review of Systems: Constitutional:  no weight loss, fever, night sweats and fatigued Eyes: no complaints ENT: no complaints Cardiovascular: no chest pain or dyspnea on exertion Respiratory: no cough, shortness of breath, or wheezing Neurological: no TIA or stroke symptoms Dermatological: positive for lumps Gastrointestinal: no abdominal pain, change in bowel habits, or black or bloody stools Genito-Urinary: no dysuria, trouble voiding, or hematuria Hematological and Lymphatic: negative Breast: negative Musculoskeletal: negative Remaining  ROS negative.   Medications:   I have reviewed the patient's current medications.  Current Outpatient Prescriptions  Medication Sig Dispense Refill  . ALPRAZolam (XANAX) 1 MG tablet Take 1 mg by mouth 3 (three) times daily as needed. For anxiety      . amLODipine (NORVASC) 10 MG tablet Take 10 mg by mouth daily.        . Ascorbic Acid (VITAMIN C) 100 MG tablet Take 100 mg by mouth daily.        . calcium-vitamin D (OSCAL WITH D) 500-200 MG-UNIT per tablet Take 1 tablet by mouth daily.        . hydrochlorothiazide (HYDRODIURIL) 25 MG tablet TAKE 1 TABLET BY MOUTH ON MONDAY, WEDNESDAY, AND FRIDAY  30 tablet  0  . ibuprofen (ADVIL,MOTRIN) 200 MG tablet Take 400 mg by mouth every 6 (six) hours as needed. For pain       . Multiple Vitamin (MULTIVITAMIN) capsule Take 1 capsule by mouth daily.        . ondansetron (ZOFRAN-ODT) 4 MG disintegrating tablet Take 4 mg by mouth every 8 (eight) hours as needed. For nausea       . warfarin (COUMADIN) 5 MG tablet Take 2 tablets (10 mg total) by mouth daily. Or as directed  60 tablet  2    Allergies:  Allergies  Allergen Reactions  . Hydrocodone-Acetaminophen Itching and Rash    Physical Exam: Filed Vitals:   04/12/12 1102  BP: 103/71  Pulse: 94  Temp: 98.3 F (36.8 C)  Weight: 185 lbs. HEENT:  Sclerae anicteric, conjunctivae pink.  Oropharynx clear.  No mucositis or candidiasis.   Nodes: The right upper anterior chest wall continues to significantly improve.  I really am unable  to feel any subcutaneous nodules, more like discrete thickening. Breast Exam: Deferred.   Lungs:  Clear to auscultation without wheezing or rhonchi appreciated. Heart:  Regular rate and rhythm.   Abdomen:  Soft, nontender.  Positive bowel sounds.  No organomegaly or masses palpated.   Musculoskeletal:  No focal spinal tenderness to palpation.  Extremities: Evidence to suggest significant improvement of right arm lymphedema. Skin:  Benign.   Neuro:  Nonfocal, alert  and oriented x 3. Lab Results: Lab Results  Component Value Date   WBC 7.1 04/12/2012   HGB 12.1 04/12/2012   HCT 36.9 04/12/2012   MCV 80.6 04/12/2012   PLT 125* 04/12/2012   NEUTROABS 4.2 04/12/2012     Chemistry      Component Value Date/Time   NA 140 04/05/2012 1037   K 3.4* 04/05/2012 1037   CL 102 04/05/2012 1037   CO2 27 04/05/2012 1037   BUN 9 04/05/2012 1037   CREATININE 0.84 04/05/2012 1037      Component Value Date/Time   CALCIUM 9.3 04/05/2012 1037   ALKPHOS 82 04/05/2012 1037   AST 39* 04/05/2012 1037   ALT 20 04/05/2012 1037   BILITOT 0.3 04/05/2012 1037     2D Echocardiogram: 01/19/12 Study Conclusions - Left ventricle: The cavity size was normal. Systolic function was normal. The estimated ejection fraction was in the range of 55% to 60%. Wall motion was normal; there were no regional wall motion abnormalities. - Aortic valve: Mild regurgitation. - Left atrium: The atrium was mildly dilated. Transthoracic echocardiography. M-mode, complete 2D, spectral Doppler, and color Doppler. Height: Height: 167.6cm. Height: 66in. Weight: Weight: 90.7kg. Weight: 199.6lb. Body mass index: BMI: 32.3kg/m^2. Body surface area: BSA: 13m^2. Patient status: Outpatient. Location: Echo laboratory. Prepared and Electronically Authenticated by Georga Hacking, MD [facility]     Assessment:  Patient is a 56 year old [place] woman with metastatic HER-2/neu positive breast carcinoma. She is currently day 7 cycle 4 of every 3 week Kadcyla given in the palliative setting, evidence of profound disease regression.  2. History of deep vein thrombosis of the right upper extremity with associated significant lymphedema on Coumadin 5.0 mg daily with a slightly subtherapeutic INR.  3. Persistent right upper arm lymphedema, active treatment to the lymphedema clinic, with significant improvement noted.  Evidence of mild peripheral neuropathy reported.  Case to be reviewed with Dr. Pierce Crane.  Plan:  [name] will return on 04/26/12 for follow up exam prior to day 1 cycle 5 of every 3 week Kadcyla in the palliative setting for progressive metastatic HER-2 positive breast cancer. Appropriate pre-labs including CBC, serum chemistries, LDH, CA 27-29, and PT/INR also be obtained. She will continue on her current dose of Coumadin at 5 mg. I have provided a prescription for gabapentin 100 mg 1 by mouth each bedtime to see if this may help a bit with her neuropathy of the right upper extremity. This plan was reviewed with the patient, who voices understanding and agreement.  She knows to call with any changes or problems.    Ferris Fielden T, PA-C 04/12/2012

## 2012-04-12 NOTE — Telephone Encounter (Signed)
sent michelle email to add patient's treatment on for 04-26-2012

## 2012-04-13 ENCOUNTER — Telehealth: Payer: Self-pay | Admitting: *Deleted

## 2012-04-13 NOTE — Telephone Encounter (Signed)
Per staff message from Laurie, I have scheduled treatment appts.  JMW  

## 2012-04-16 IMAGING — PT NM PET TUM IMG RESTAG (PS) SKULL BASE T - THIGH
1 series · 25 of 25 positions shown · non-contrast
Comparison: 08/02/2011

CLINICAL DATA: Breast cancer. Subsequent treatment strategy.

NUCLEAR MEDICINE PET/CT
TECHNIQUE: 15.2 mCi F-18 FDG was injected intravenously.  Full-
ring PET imaging was performed from the skull base through the mid-
thighs.  CT data was obtained and used for attenuation correction
and anatomic localization only.  (This was not acquired as a
diagnostic CT examination.)  Data regarding site of injection,
patient weight, post injection waiting period, and fasting blood
sugar is available in the [REDACTED] documentation.

[Series 151: reformatted · axial · 3.3mm · 3.91mm/px · z∈[-870,+0]mm · 25 of 265 slices shown]
[im 1/265]
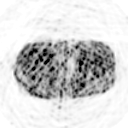
[im 12/265]
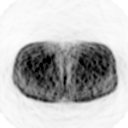
[im 23/265]
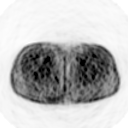
[im 34/265]
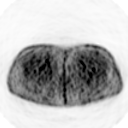
[im 45/265]
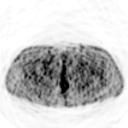
[im 56/265]
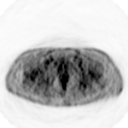
[im 67/265]
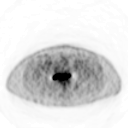
[im 78/265]
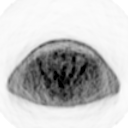
[im 89/265]
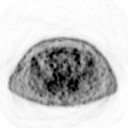
[im 100/265]
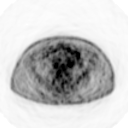
[im 111/265]
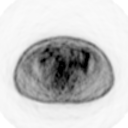
[im 122/265]
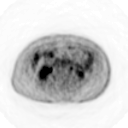
[im 133/265]
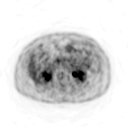
[im 144/265]
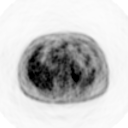
[im 155/265]
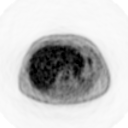
[im 166/265]
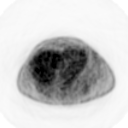
[im 177/265]
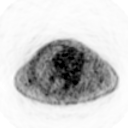
[im 188/265]
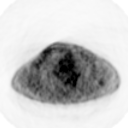
[im 199/265]
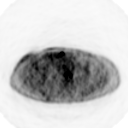
[im 210/265]
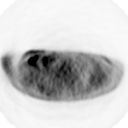
[im 221/265]
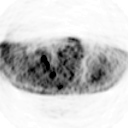
[im 232/265]
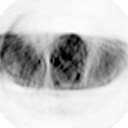
[im 243/265]
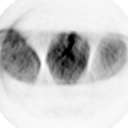
[im 254/265]
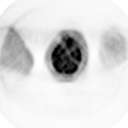
[im 265/265]
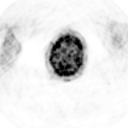

[25 of 25 positions shown; findings below may reference images not displayed]

FINDINGS: The various lymph nodes and subcutaneous nodules in the
right internal jugular chain, left posterior cervical triangle,
supraclavicular regions bilaterally, and tracking in the right
axilla and subcutaneous tissues of the right chest are again noted
and overall significantly increased in size and hypermetabolic
activity.

For example, a subcutaneous nodule anterior to the right deltoid
muscle measures 3.2 x 2.1 cm (formerly 1.9 x 1.3 cm) and has a
maximum standard uptake value of 12.9 (formerly 5.6).  Left
internal jugular adenopathy has a maximum standard uptake value of
9.0 (formerly 7.8) and subcutaneous nodularity posterior to the
lower cervical spine has a maximum standard uptake value of
(formerly 3.0).

Left posterior cervical triangle node measuring 1.1 cm in short
axis on image 41 has a maximum standard uptake value of
(formerly 4.1).  New hypermetabolic left supraclavicular adenopathy
noted.  A subcutaneous nodule anterior to the sternum has a maximum
standard uptake value of 5.6 (formerly 2.7).

No hypermetabolic activity suspicious for tumor is observed in the
abdomen or pelvis.  Abnormal swelling of the right upper extremity
may be due to lymphedema.

Bilateral inguinal lymph nodes are not hypermetabolic. No
additional significant findings noted.
IMPRESSION: 1.  Increase in size and activity of the lymph nodes in the right
internal jugular, left posterior cervical triangle, bilateral
supraclavicular, right axillary, and right thoracic subcutaneous
stations.  Right upper extremity subcutaneous edema suggests
lymphedema.

## 2012-04-26 ENCOUNTER — Ambulatory Visit (HOSPITAL_BASED_OUTPATIENT_CLINIC_OR_DEPARTMENT_OTHER): Payer: Medicare Other | Admitting: Physician Assistant

## 2012-04-26 ENCOUNTER — Ambulatory Visit (HOSPITAL_BASED_OUTPATIENT_CLINIC_OR_DEPARTMENT_OTHER): Payer: Medicare Other

## 2012-04-26 ENCOUNTER — Telehealth: Payer: Self-pay | Admitting: Oncology

## 2012-04-26 ENCOUNTER — Other Ambulatory Visit (HOSPITAL_BASED_OUTPATIENT_CLINIC_OR_DEPARTMENT_OTHER): Payer: Medicare Other | Admitting: Lab

## 2012-04-26 VITALS — BP 123/78 | HR 80 | Temp 98.5°F | Ht 64.5 in | Wt 187.2 lb

## 2012-04-26 DIAGNOSIS — I82409 Acute embolism and thrombosis of unspecified deep veins of unspecified lower extremity: Secondary | ICD-10-CM

## 2012-04-26 DIAGNOSIS — I890 Lymphedema, not elsewhere classified: Secondary | ICD-10-CM

## 2012-04-26 DIAGNOSIS — C50219 Malignant neoplasm of upper-inner quadrant of unspecified female breast: Secondary | ICD-10-CM

## 2012-04-26 DIAGNOSIS — C50919 Malignant neoplasm of unspecified site of unspecified female breast: Secondary | ICD-10-CM

## 2012-04-26 DIAGNOSIS — Z5112 Encounter for antineoplastic immunotherapy: Secondary | ICD-10-CM

## 2012-04-26 DIAGNOSIS — Z7901 Long term (current) use of anticoagulants: Secondary | ICD-10-CM

## 2012-04-26 DIAGNOSIS — Z5181 Encounter for therapeutic drug level monitoring: Secondary | ICD-10-CM

## 2012-04-26 DIAGNOSIS — Z86718 Personal history of other venous thrombosis and embolism: Secondary | ICD-10-CM

## 2012-04-26 DIAGNOSIS — I82629 Acute embolism and thrombosis of deep veins of unspecified upper extremity: Secondary | ICD-10-CM

## 2012-04-26 LAB — CBC WITH DIFFERENTIAL/PLATELET
BASO%: 0.5 % (ref 0.0–2.0)
Basophils Absolute: 0 10*3/uL (ref 0.0–0.1)
EOS%: 2.8 % (ref 0.0–7.0)
Eosinophils Absolute: 0.2 10*3/uL (ref 0.0–0.5)
HCT: 36.4 % (ref 34.8–46.6)
HGB: 11.9 g/dL (ref 11.6–15.9)
LYMPH%: 26.2 % (ref 14.0–49.7)
MCH: 26.6 pg (ref 25.1–34.0)
MCHC: 32.7 g/dL (ref 31.5–36.0)
MCV: 81.3 fL (ref 79.5–101.0)
MONO#: 0.6 10*3/uL (ref 0.1–0.9)
MONO%: 8.1 % (ref 0.0–14.0)
NEUT#: 4.6 10*3/uL (ref 1.5–6.5)
NEUT%: 62.4 % (ref 38.4–76.8)
Platelets: 375 10*3/uL (ref 145–400)
RBC: 4.48 10*6/uL (ref 3.70–5.45)
RDW: 17.6 % — ABNORMAL HIGH (ref 11.2–14.5)
WBC: 7.4 10*3/uL (ref 3.9–10.3)
lymph#: 1.9 10*3/uL (ref 0.9–3.3)

## 2012-04-26 LAB — COMPREHENSIVE METABOLIC PANEL
ALT: 18 U/L (ref 0–35)
AST: 33 U/L (ref 0–37)
Albumin: 4.1 g/dL (ref 3.5–5.2)
Alkaline Phosphatase: 85 U/L (ref 39–117)
BUN: 8 mg/dL (ref 6–23)
CO2: 27 mEq/L (ref 19–32)
Calcium: 9.7 mg/dL (ref 8.4–10.5)
Chloride: 100 mEq/L (ref 96–112)
Creatinine, Ser: 0.71 mg/dL (ref 0.50–1.10)
Glucose, Bld: 114 mg/dL — ABNORMAL HIGH (ref 70–99)
Potassium: 3.6 mEq/L (ref 3.5–5.3)
Sodium: 142 mEq/L (ref 135–145)
Total Bilirubin: 0.3 mg/dL (ref 0.3–1.2)
Total Protein: 7.3 g/dL (ref 6.0–8.3)

## 2012-04-26 LAB — CANCER ANTIGEN 27.29: CA 27.29: 45 U/mL — ABNORMAL HIGH (ref 0–39)

## 2012-04-26 LAB — PROTIME-INR
INR: 1.6 — ABNORMAL LOW (ref 2.00–3.50)
Protime: 19.2 Seconds — ABNORMAL HIGH (ref 10.6–13.4)

## 2012-04-26 LAB — LACTATE DEHYDROGENASE: LDH: 217 U/L (ref 94–250)

## 2012-04-26 MED ORDER — DIPHENHYDRAMINE HCL 25 MG PO CAPS
50.0000 mg | ORAL_CAPSULE | Freq: Once | ORAL | Status: AC
  Administered 2012-04-26: 50 mg via ORAL

## 2012-04-26 MED ORDER — ACETAMINOPHEN 325 MG PO TABS
650.0000 mg | ORAL_TABLET | Freq: Once | ORAL | Status: AC
  Administered 2012-04-26: 650 mg via ORAL

## 2012-04-26 MED ORDER — SODIUM CHLORIDE 0.9 % IV SOLN
Freq: Once | INTRAVENOUS | Status: AC
  Administered 2012-04-26: 12:00:00 via INTRAVENOUS

## 2012-04-26 MED ORDER — SODIUM CHLORIDE 0.9 % IJ SOLN
10.0000 mL | INTRAMUSCULAR | Status: DC | PRN
  Administered 2012-04-26: 10 mL
  Filled 2012-04-26: qty 10

## 2012-04-26 MED ORDER — HEPARIN SOD (PORK) LOCK FLUSH 100 UNIT/ML IV SOLN
500.0000 [IU] | Freq: Once | INTRAVENOUS | Status: DC | PRN
  Filled 2012-04-26: qty 5

## 2012-04-26 MED ORDER — SODIUM CHLORIDE 0.9 % IV SOLN
3.5000 mg/kg | Freq: Once | INTRAVENOUS | Status: AC
  Administered 2012-04-26: 320 mg via INTRAVENOUS
  Filled 2012-04-26: qty 16

## 2012-04-26 NOTE — Progress Notes (Signed)
Patient refused to stay for the 30 minutes post Kadcyla infusion.  Explained to patient that this was a manufacturer standard/requirement that we monitor each patient for 30 minutes after each dose of Kadcyla.  Patient stated "well they said it was only after the first dose that I had to stay and I am ready to go."  De-accessed patient and gave patient AVS.  Horton Marshall RN

## 2012-04-26 NOTE — Patient Instructions (Addendum)
[facility] Discharge Instructions for Patients Receiving Chemotherapy  Today you received the following chemotherapy agents Kadcyla.  To help prevent nausea and vomiting after your treatment, we encourage you to take your nausea medication as ordered per MD.    If you develop nausea and vomiting that is not controlled by your nausea medication, call the clinic. If it is after clinic hours your family physician or the after hours number for the clinic or go to the Emergency Department.   BELOW ARE SYMPTOMS THAT SHOULD BE REPORTED IMMEDIATELY:  *FEVER GREATER THAN 100.5 F  *CHILLS WITH OR WITHOUT FEVER  NAUSEA AND VOMITING THAT IS NOT CONTROLLED WITH YOUR NAUSEA MEDICATION  *UNUSUAL SHORTNESS OF BREATH  *UNUSUAL BRUISING OR BLEEDING  TENDERNESS IN MOUTH AND THROAT WITH OR WITHOUT PRESENCE OF ULCERS  *URINARY PROBLEMS  *BOWEL PROBLEMS  UNUSUAL RASH Items with * indicate a potential emergency and should be followed up as soon as possible.  . Please let the nurse know about any problems that you may have experienced. Feel free to call the clinic you have any questions or concerns. The clinic phone number is [phone].   I have been informed and understand all the instructions given to me. I know to contact the clinic, my physician, or go to the Emergency Department if any problems should occur. I do not have any questions at this time, but understand that I may call the clinic during office hours   should I have any questions or need assistance in obtaining follow up care.    __________________________________________  _____________  __________ Signature of Patient or Authorized Representative            Date                   Time    __________________________________________ Nurse's Signature    

## 2012-04-26 NOTE — Progress Notes (Signed)
Hematology and Oncology Follow Up Visit  [name] [id number] 11/28/1955 56 y.o. 04/26/2012    HPI: Patient is a 56 year old [place] woman with metastatic HER-2/neu positive breast carcinoma. She is currently  day 1 cycle 5 of every 3 week Kadcyla given in the palliative setting, evidence to suggest possible improvement of symptomatology.  2. History of deep vein thrombosis of the right upper extremity with associated significant lymphedema on Coumadin therapy 5 mg daily.  3. Persistent right upper arm lymphedema, active treatment to the lymphedema clinic.  Interim History:   [name] is seen today for followup prior to her fifth cycle of palliative every 3 week Kadcyla.  She continues to improve. She denies any abnormal bleeding or bruising symptoms. She does have an occasional productive cough, but she denies any significant color to it.  She denies any nausea, emesis, diarrhea or constipation issues. She does admit to some numbness and tingling of the right upper extremity at the fingertips. A detailed review of systems is otherwise noncontributory as noted below.  Review of Systems: Constitutional:  no weight loss, fever, night sweats and fatigued Eyes: no complaints ENT: no complaints Cardiovascular: no chest pain or dyspnea on exertion Respiratory: no cough, shortness of breath, or wheezing Neurological: no TIA or stroke symptoms Dermatological: positive for lumps Gastrointestinal: no abdominal pain, change in bowel habits, or black or bloody stools Genito-Urinary: no dysuria, trouble voiding, or hematuria Hematological and Lymphatic: negative Breast: negative Musculoskeletal: negative Remaining ROS negative.   Medications:   I have reviewed the patient's current medications.  Current Outpatient Prescriptions  Medication Sig Dispense Refill  . ALPRAZolam (XANAX) 1 MG tablet Take 1 mg by mouth 3 (three) times daily as needed. For anxiety      .  amLODipine (NORVASC) 10 MG tablet Take 10 mg by mouth daily.        . Ascorbic Acid (VITAMIN C) 100 MG tablet Take 100 mg by mouth daily.        . calcium-vitamin D (OSCAL WITH D) 500-200 MG-UNIT per tablet Take 1 tablet by mouth daily.        Marland Kitchen gabapentin (NEURONTIN) 100 MG capsule Take 100 mg by mouth at bedtime.      . hydrochlorothiazide (HYDRODIURIL) 25 MG tablet TAKE 1 TABLET BY MOUTH ON MONDAY, WEDNESDAY, AND FRIDAY  30 tablet  0  . ibuprofen (ADVIL,MOTRIN) 200 MG tablet Take 400 mg by mouth every 6 (six) hours as needed. For pain       . Multiple Vitamin (MULTIVITAMIN) capsule Take 1 capsule by mouth daily.        . ondansetron (ZOFRAN-ODT) 4 MG disintegrating tablet Take 4 mg by mouth every 8 (eight) hours as needed. For nausea       . warfarin (COUMADIN) 5 MG tablet Take 2 tablets (10 mg total) by mouth daily. Or as directed  60 tablet  2   No current facility-administered medications for this visit.   Facility-Administered Medications Ordered in Other Visits  Medication Dose Route Frequency Provider Last Rate Last Dose  . 0.9 %  sodium chloride infusion   Intravenous Once Pierce Crane, MD 20 mL/hr at 04/26/12 1225    . acetaminophen (TYLENOL) tablet 650 mg  650 mg Oral Once Pierce Crane, MD   650 mg at 04/26/12 1225  . ado-trastuzumab emtansine (KADCYLA) 320 mg in sodium chloride 0.9 % 250 mL chemo infusion  3.5 mg/kg (Treatment Plan Actual) Intravenous Once Pierce Crane, MD      .  diphenhydrAMINE (BENADRYL) capsule 50 mg  50 mg Oral Once Pierce Crane, MD   50 mg at 04/26/12 1225  . heparin lock flush 100 unit/mL  500 Units Intracatheter Once PRN Pierce Crane, MD      . sodium chloride 0.9 % injection 10 mL  10 mL Intracatheter PRN Pierce Crane, MD        Allergies:  Allergies  Allergen Reactions  . Hydrocodone-Acetaminophen Itching and Rash    Physical Exam: Filed Vitals:   04/26/12 1144  BP: 123/78  Pulse: 80  Temp: 98.5 F (36.9 C)  Weight: 187 lbs. HEENT:  Sclerae  anicteric, conjunctivae pink.  Oropharynx clear.  No mucositis or candidiasis.   Nodes: The right upper anterior chest wall continues to significantly improve.  I really am unable to feel any subcutaneous nodules, more like discrete thickening. Breast Exam: Deferred.   Lungs:  Clear to auscultation without wheezing or rhonchi appreciated. Heart:  Regular rate and rhythm.   Abdomen:  Soft, nontender.  Positive bowel sounds.  No organomegaly or masses palpated.   Musculoskeletal:  No focal spinal tenderness to palpation.  Extremities: Evidence to suggest significant improvement of right arm lymphedema. Skin:  Benign.   Neuro:  Nonfocal, alert and oriented x 3.  Lab Results: Lab Results  Component Value Date   WBC 7.4 04/26/2012   HGB 11.9 04/26/2012   HCT 36.4 04/26/2012   MCV 81.3 04/26/2012   PLT 375 04/26/2012   NEUTROABS 4.6 04/26/2012     Chemistry      Component Value Date/Time   NA 140 04/05/2012 1037   K 3.4* 04/05/2012 1037   CL 102 04/05/2012 1037   CO2 27 04/05/2012 1037   BUN 9 04/05/2012 1037   CREATININE 0.84 04/05/2012 1037      Component Value Date/Time   CALCIUM 9.3 04/05/2012 1037   ALKPHOS 82 04/05/2012 1037   AST 39* 04/05/2012 1037   ALT 20 04/05/2012 1037   BILITOT 0.3 04/05/2012 1037     2D Echocardiogram: 01/19/12 Study Conclusions - Left ventricle: The cavity size was normal. Systolic function was normal. The estimated ejection fraction was in the range of 55% to 60%. Wall motion was normal; there were no regional wall motion abnormalities. - Aortic valve: Mild regurgitation. - Left atrium: The atrium was mildly dilated. Transthoracic echocardiography. M-mode, complete 2D, spectral Doppler, and color Doppler. Height: Height: 167.6cm. Height: 66in. Weight: Weight: 90.7kg. Weight: 199.6lb. Body mass index: BMI: 32.3kg/m^2. Body surface area: BSA: 22m^2. Patient status: Outpatient. Location: Echo laboratory. Prepared and Electronically Authenticated by Georga Hacking, MD [facility]     Assessment:  Patient is a 56 year old [place] woman with metastatic HER-2/neu positive breast carcinoma. She is currently day 1 cycle 5 of every 3 week Kadcyla given in the palliative setting, evidence of profound disease regression.  2. History of deep vein thrombosis of the right upper extremity with associated significant lymphedema on Coumadin 5.0 mg daily with a slightly subtherapeutic INR.  3. Persistent right upper arm lymphedema, active treatment to the lymphedema clinic, with significant improvement noted.  Evidence of mild peripheral neuropathy reported.  Case reviewed with Dr. Pierce Crane.  Plan:  [name] will receive day 1 cycle 5 of every 3 week Kadcyla in the palliative setting for progressive metastatic HER-2 positive breast cancer. She will increase her  dose of Coumadin to 7.5 mg daily.  She will be seen on 05/03/12 for repeat CBC and PT/INR, then on 05/17/12 prior to  day 1 cycle 6 Kadcyla with appropriate pre-labs.  We will also schedule a repeat 2D Echocardiogram, last obtained on 01/19/12 This plan was reviewed with the patient, who voices understanding and agreement.  She knows to call with any changes or problems.    Mazi Brailsford T, PA-C 04/26/2012

## 2012-04-26 NOTE — Telephone Encounter (Signed)
gve the pt her June-aug 2013 appt calendar along with the echo appt

## 2012-05-01 ENCOUNTER — Other Ambulatory Visit: Payer: Self-pay | Admitting: *Deleted

## 2012-05-01 ENCOUNTER — Other Ambulatory Visit: Payer: Self-pay | Admitting: Physician Assistant

## 2012-05-01 MED ORDER — PROMETHAZINE-CODEINE 6.25-10 MG/5ML PO SYRP: ORAL_SOLUTION | ORAL | Status: DC

## 2012-05-02 ENCOUNTER — Encounter (HOSPITAL_COMMUNITY): Payer: Self-pay | Admitting: Emergency Medicine

## 2012-05-02 ENCOUNTER — Emergency Department (INDEPENDENT_AMBULATORY_CARE_PROVIDER_SITE_OTHER)
Admission: EM | Admit: 2012-05-02 | Discharge: 2012-05-02 | Disposition: A | Discharge status: Home / Self Care | Payer: Medicare Other | Source: Home / Self Care | Attending: Family Medicine | Admitting: Family Medicine

## 2012-05-02 DIAGNOSIS — M7071 Other bursitis of hip, right hip: Secondary | ICD-10-CM

## 2012-05-02 DIAGNOSIS — M76899 Other specified enthesopathies of unspecified lower limb, excluding foot: Secondary | ICD-10-CM

## 2012-05-02 NOTE — Discharge Instructions (Signed)
Ice pack on hip for soreness, see your doctor as planned.

## 2012-05-02 NOTE — ED Provider Notes (Signed)
History     CSN: [id number]  Arrival date & time 05/02/12  1439   First MD Initiated Contact with Patient 05/02/12 [phone]      Chief Complaint  Patient presents with  . Abscess    (Consider location/radiation/quality/duration/timing/severity/associated sxs/prior treatment) Patient is a 56 y.o. female presenting with abscess. The history is provided by the patient and a relative.  Abscess  This is a new problem. The current episode started today. The problem has been unchanged. The abscess is present on the right buttock. The problem is mild. The abscess is characterized by painfulness.    Past Medical History  Diagnosis Date  . DVT (deep venous thrombosis) 10/07/2011  . Cancer     Past Surgical History  Procedure Date  . Mastectomy     bilateral    No family history on file.  History  Substance Use Topics  . Smoking status: Never Smoker   . Smokeless tobacco: Not on file  . Alcohol Use: No    OB History    Grav Para Term Preterm Abortions TAB SAB Ect Mult Living                  Review of Systems  Constitutional: Negative.   Musculoskeletal: Negative.   Skin: Negative.     Allergies  Hydrocodone-acetaminophen  Home Medications   Current Outpatient Rx  Name Route Sig Dispense Refill  . ALPRAZOLAM 1 MG PO TABS Oral Take 1 mg by mouth 3 (three) times daily as needed. For anxiety    . AMLODIPINE BESYLATE 10 MG PO TABS Oral Take 10 mg by mouth daily.      Marland Kitchen VITAMIN C 100 MG PO TABS Oral Take 100 mg by mouth daily.      Marland Kitchen CALCIUM CARBONATE-VITAMIN D 500-200 MG-UNIT PO TABS Oral Take 1 tablet by mouth daily.      Marland Kitchen GABAPENTIN 100 MG PO CAPS Oral Take 100 mg by mouth at bedtime.    Marland Kitchen HYDROCHLOROTHIAZIDE 25 MG PO TABS  TAKE 1 TABLET BY MOUTH ON MONDAY, WEDNESDAY, AND FRIDAY 30 tablet 0  . IBUPROFEN 200 MG PO TABS Oral Take 400 mg by mouth every 6 (six) hours as needed. For pain     . MULTIVITAMINS PO CAPS Oral Take 1 capsule by mouth daily.      Marland Kitchen ONDANSETRON  4 MG PO TBDP Oral Take 4 mg by mouth every 8 (eight) hours as needed. For nausea     . PROMETHAZINE-CODEINE 6.25-10 MG/5ML PO SYRP  po q 6-8 hrs prn cough 200 mL 0    Called in to [facility]  [phone]  . WARFARIN SODIUM 5 MG PO TABS Oral Take 2 tablets (10 mg total) by mouth daily. Or as directed 60 tablet 2    OR AS DIRECTED    BP 89/62  Pulse 90  Temp 98.3 F (36.8 C) (Oral)  Resp 20  SpO2 100%  Physical Exam  Nursing note and vitals reviewed. Constitutional: She appears well-developed and well-nourished.  Neurological: She is alert.  Skin: Skin is warm and dry.       No skin lesions evident, nurse and daughter agree.    ED Course  Procedures (including critical care time)  Labs Reviewed - No data to display No results found.   1. Bursitis of hip, right       MDM          Linna Hoff, MD 05/02/12 [phone]

## 2012-05-02 NOTE — ED Notes (Signed)
Reports abscess to right buttocks.  Onset of soreness today per patient.  Patient has multiple complaints.

## 2012-05-02 NOTE — ED Notes (Signed)
Patient's adult daughter with patient.  Patient's daughter does not have any concern for patient's behavior or speech.  Daughter reports patient takes xanax and acts as she is now because of medicine.  There is no visible signe of any abscess, any bruise or scrape-no injury visible.

## 2012-05-02 NOTE — ED Notes (Signed)
Patient ambulatory, answers direct questions appropriately.  Follows commands

## 2012-05-03 ENCOUNTER — Other Ambulatory Visit: Payer: Medicare Other | Admitting: Lab

## 2012-05-03 ENCOUNTER — Ambulatory Visit (HOSPITAL_BASED_OUTPATIENT_CLINIC_OR_DEPARTMENT_OTHER): Payer: Medicare Other | Admitting: Physician Assistant

## 2012-05-03 VITALS — BP 108/70 | HR 98 | Temp 98.2°F | Ht 64.5 in | Wt 188.2 lb

## 2012-05-03 DIAGNOSIS — C50219 Malignant neoplasm of upper-inner quadrant of unspecified female breast: Secondary | ICD-10-CM

## 2012-05-03 DIAGNOSIS — I82409 Acute embolism and thrombosis of unspecified deep veins of unspecified lower extremity: Secondary | ICD-10-CM

## 2012-05-03 DIAGNOSIS — Z86718 Personal history of other venous thrombosis and embolism: Secondary | ICD-10-CM

## 2012-05-03 DIAGNOSIS — C50919 Malignant neoplasm of unspecified site of unspecified female breast: Secondary | ICD-10-CM

## 2012-05-03 DIAGNOSIS — J40 Bronchitis, not specified as acute or chronic: Secondary | ICD-10-CM

## 2012-05-03 DIAGNOSIS — I890 Lymphedema, not elsewhere classified: Secondary | ICD-10-CM

## 2012-05-03 LAB — CBC WITH DIFFERENTIAL/PLATELET
BASO%: 0.5 % (ref 0.0–2.0)
Basophils Absolute: 0.1 10*3/uL (ref 0.0–0.1)
EOS%: 3.9 % (ref 0.0–7.0)
Eosinophils Absolute: 0.4 10*3/uL (ref 0.0–0.5)
HCT: 29.2 % — ABNORMAL LOW (ref 34.8–46.6)
HGB: 9.7 g/dL — ABNORMAL LOW (ref 11.6–15.9)
LYMPH%: 31.9 % (ref 14.0–49.7)
MCH: 26.3 pg (ref 25.1–34.0)
MCHC: 33.2 g/dL (ref 31.5–36.0)
MCV: 79.1 fL — ABNORMAL LOW (ref 79.5–101.0)
MONO#: 1.2 10*3/uL — ABNORMAL HIGH (ref 0.1–0.9)
MONO%: 13.2 % (ref 0.0–14.0)
NEUT#: 4.6 10*3/uL (ref 1.5–6.5)
NEUT%: 50.5 % (ref 38.4–76.8)
Platelets: 134 10*3/uL — ABNORMAL LOW (ref 145–400)
RBC: 3.69 10*6/uL — ABNORMAL LOW (ref 3.70–5.45)
RDW: 17.7 % — ABNORMAL HIGH (ref 11.2–14.5)
WBC: 9.2 10*3/uL (ref 3.9–10.3)
lymph#: 2.9 10*3/uL (ref 0.9–3.3)
nRBC: 0 % (ref 0–0)

## 2012-05-03 LAB — PROTIME-INR
INR: 3.2 (ref 2.00–3.50)
Protime: 38.4 Seconds — ABNORMAL HIGH (ref 10.6–13.4)

## 2012-05-03 NOTE — Patient Instructions (Addendum)
1. HOLD Coumadin tonight (05/03/12) and tomorrow (05/04/12).       Restart Coumadin 5mg  on 05/05/12-  2. Take Zithromax as prescribed.

## 2012-05-03 NOTE — Progress Notes (Signed)
Hematology and Oncology Follow Up Visit  [name] [id number] 09-13-1956 56 y.o. 05/03/2012    HPI: Patient is a 56 year old [place] woman with metastatic HER-2/neu positive breast carcinoma. She is currently  day 7 cycle 5 of every 3 week Kadcyla given in the palliative setting, evidence to suggest possible improvement of symptomatology.  2. History of deep vein thrombosis of the right upper extremity with associated significant lymphedema on Coumadin therapy 5 mg daily.  3. Persistent right upper arm lymphedema, active treatment to the lymphedema clinic.  Interim History:   [name] is seen today for followup after her fifth cycle of palliative every 3 week Kadcyla.  She continues to improve. She has had some episodes of abnormal bruising symptoms, and did sustain a fall from her bed 2 days ago, she presented to ED for assessment yesterday. She does have an occasional productive cough, and notes it's green in color. She denies any nausea, emesis, diarrhea or constipation issues. She does admit to some numbness and tingling of the right upper extremity at the fingertips. A detailed review of systems is otherwise noncontributory as noted below.  Review of Systems: Constitutional:  no weight loss, fever, night sweats and fatigued Eyes: no complaints ENT: no complaints Cardiovascular: no chest pain or dyspnea on exertion Respiratory: no cough, shortness of breath, or wheezing Neurological: no TIA or stroke symptoms Dermatological: positive for lumps Gastrointestinal: no abdominal pain, change in bowel habits, or black or bloody stools Genito-Urinary: no dysuria, trouble voiding, or hematuria Hematological and Lymphatic: negative Breast: negative Musculoskeletal: negative Remaining ROS negative.   Medications:   I have reviewed the patient's current medications.  Current Outpatient Prescriptions  Medication Sig Dispense Refill  . ALPRAZolam (XANAX) 1 MG tablet  Take 1 mg by mouth 3 (three) times daily as needed. For anxiety      . amLODipine (NORVASC) 10 MG tablet Take 10 mg by mouth daily.        . Ascorbic Acid (VITAMIN C) 100 MG tablet Take 100 mg by mouth daily.        . calcium-vitamin D (OSCAL WITH D) 500-200 MG-UNIT per tablet Take 1 tablet by mouth daily.        Marland Kitchen gabapentin (NEURONTIN) 100 MG capsule Take 100 mg by mouth at bedtime.      . hydrochlorothiazide (HYDRODIURIL) 25 MG tablet TAKE 1 TABLET BY MOUTH ON MONDAY, WEDNESDAY, AND FRIDAY  30 tablet  0  . ibuprofen (ADVIL,MOTRIN) 200 MG tablet Take 400 mg by mouth every 6 (six) hours as needed. For pain       . Multiple Vitamin (MULTIVITAMIN) capsule Take 1 capsule by mouth daily.        . ondansetron (ZOFRAN-ODT) 4 MG disintegrating tablet Take 4 mg by mouth every 8 (eight) hours as needed. For nausea       . promethazine-codeine (PHENERGAN WITH CODEINE) 6.25-10 MG/5ML syrup po q 6-8 hrs prn cough  200 mL  0  . warfarin (COUMADIN) 5 MG tablet Take 2 tablets (10 mg total) by mouth daily. Or as directed  60 tablet  2    Allergies:  Allergies  Allergen Reactions  . Hydrocodone-Acetaminophen Itching and Rash    Physical Exam: Filed Vitals:   05/03/12 1112  BP: 108/70  Pulse: 98  Temp: 98.2 F (36.8 C)  Weight: 188 lbs. HEENT:  Sclerae anicteric, conjunctivae pink.  Oropharynx clear.  No mucositis or candidiasis.   Nodes: The right upper anterior chest wall continues  to significantly improve.  I really am unable to feel any subcutaneous nodules, more like discrete thickening. Breast Exam: Deferred.   Lungs:  Clear to auscultation without wheezing or rhonchi appreciated. Heart:  Regular rate and rhythm.   Abdomen:  Soft, nontender.  Positive bowel sounds.  No organomegaly or masses palpated.   Musculoskeletal:  No focal spinal tenderness to palpation.  Extremities: Evidence to suggest significant improvement of right arm lymphedema. Skin:  Benign.   Neuro:  Nonfocal, alert and  oriented x 3.  Lab Results: Lab Results  Component Value Date   WBC 9.2 05/03/2012   HGB 9.7* 05/03/2012   HCT 29.2* 05/03/2012   MCV 79.1* 05/03/2012   PLT 134* 05/03/2012   NEUTROABS 4.6 05/03/2012     Chemistry      Component Value Date/Time   NA 142 04/26/2012 1055   K 3.6 04/26/2012 1055   CL 100 04/26/2012 1055   CO2 27 04/26/2012 1055   BUN 8 04/26/2012 1055   CREATININE 0.71 04/26/2012 1055      Component Value Date/Time   CALCIUM 9.7 04/26/2012 1055   ALKPHOS 85 04/26/2012 1055   AST 33 04/26/2012 1055   ALT 18 04/26/2012 1055   BILITOT 0.3 04/26/2012 1055     2D Echocardiogram: 01/19/12 Study Conclusions - Left ventricle: The cavity size was normal. Systolic function was normal. The estimated ejection fraction was in the range of 55% to 60%. Wall motion was normal; there were no regional wall motion abnormalities. - Aortic valve: Mild regurgitation. - Left atrium: The atrium was mildly dilated. Transthoracic echocardiography. M-mode, complete 2D, spectral Doppler, and color Doppler. Height: Height: 167.6cm. Height: 66in. Weight: Weight: 90.7kg. Weight: 199.6lb. Body mass index: BMI: 32.3kg/m^2. Body surface area: BSA: 49m^2. Patient status: Outpatient. Location: Echo laboratory. Prepared and Electronically Authenticated by Georga Hacking, MD [facility]     Assessment:  Patient is a 56 year old [place] woman with metastatic HER-2/neu positive breast carcinoma. She is currently day 7 cycle 5 of every 3 week Kadcyla given in the palliative setting, evidence of profound disease regression.  2. History of deep vein thrombosis of the right upper extremity with associated significant lymphedema on Coumadin 5.0 mg daily with a slightly supratherapeutic INR.  3. Persistent right upper arm lymphedema, active treatment to the lymphedema clinic, with significant improvement noted.  Evidence of mild peripheral neuropathy reported.  4. Bronchitis  Case reviewed  with Dr. Pierce Crane.  Plan:  [name] will return on 05/17/12 for follow up prior to day 1 cycle 6 of every 3 week Kadcyla in the palliative setting for progressive metastatic HER-2 positive breast cancer. She will hold her dose of Coumadin today and tomorrow, then to resume at 5mg  daily.  She will have a repeat CBC and PT/INR on 05/17/12.  She is scheduled for a repeat 2D Echocardiogram on 05/08/12.  She has been prescribed Zithromax Z-pak for bronchitis. This plan was reviewed with the patient, who voices understanding and agreement.  She knows to call with any changes or problems.    Yasser Hepp T, PA-C 05/03/2012

## 2012-05-07 ENCOUNTER — Other Ambulatory Visit: Payer: Self-pay | Admitting: Physician Assistant

## 2012-05-07 ENCOUNTER — Telehealth: Payer: Self-pay | Admitting: *Deleted

## 2012-05-07 ENCOUNTER — Ambulatory Visit: Payer: Medicare Other | Admitting: Lab

## 2012-05-07 ENCOUNTER — Other Ambulatory Visit: Payer: Self-pay | Admitting: *Deleted

## 2012-05-07 DIAGNOSIS — I82409 Acute embolism and thrombosis of unspecified deep veins of unspecified lower extremity: Secondary | ICD-10-CM

## 2012-05-07 DIAGNOSIS — C50919 Malignant neoplasm of unspecified site of unspecified female breast: Secondary | ICD-10-CM

## 2012-05-07 LAB — CBC WITH DIFFERENTIAL/PLATELET
BASO%: 0.3 % (ref 0.0–2.0)
Basophils Absolute: 0 10*3/uL (ref 0.0–0.1)
EOS%: 1.4 % (ref 0.0–7.0)
Eosinophils Absolute: 0.2 10*3/uL (ref 0.0–0.5)
HCT: 31.2 % — ABNORMAL LOW (ref 34.8–46.6)
HGB: 10.1 g/dL — ABNORMAL LOW (ref 11.6–15.9)
LYMPH%: 26.9 % (ref 14.0–49.7)
MCH: 26 pg (ref 25.1–34.0)
MCHC: 32.4 g/dL (ref 31.5–36.0)
MCV: 80.4 fL (ref 79.5–101.0)
MONO#: 1.5 10*3/uL — ABNORMAL HIGH (ref 0.1–0.9)
MONO%: 12.6 % (ref 0.0–14.0)
NEUT#: 6.9 10*3/uL — ABNORMAL HIGH (ref 1.5–6.5)
NEUT%: 58.8 % (ref 38.4–76.8)
Platelets: 257 10*3/uL (ref 145–400)
RBC: 3.88 10*6/uL (ref 3.70–5.45)
RDW: 18.3 % — ABNORMAL HIGH (ref 11.2–14.5)
WBC: 11.8 10*3/uL — ABNORMAL HIGH (ref 3.9–10.3)
lymph#: 3.2 10*3/uL (ref 0.9–3.3)
nRBC: 1 % — ABNORMAL HIGH (ref 0–0)

## 2012-05-07 LAB — PROTIME-INR
INR: 1.4 — ABNORMAL LOW (ref 2.00–3.50)
Protime: 16.8 Seconds — ABNORMAL HIGH (ref 10.6–13.4)

## 2012-05-07 NOTE — Telephone Encounter (Signed)
Patient called reporting she has bruises which she read is a side effect of the Kadcyla.  Also reports having changes in her coumadin.  Received Kadcyla 04-26-2012.  Noticed bruises the following Monday or Tuesday shared this on 05-03-2012.  Bruises are all over per Claris Che.  Reports to her bottom, "a large bruise on the left arm and on the right arm the bruise looks like a sunburn.  I did go to the beach but didn't get a sunburn.  Bruises to the arms hurt and there are knots on my legs that aren't bruises that hurt too."  Will notify providers.  Next scheduled f/u is 05-17-2012.

## 2012-05-07 NOTE — Telephone Encounter (Signed)
Orders for patient to come in for cbc, pt/INR today per Debbora Presto PA-C.  Called Ms. Abe at 1:30 pm.  Says she will be here within an hour.

## 2012-05-08 ENCOUNTER — Ambulatory Visit (HOSPITAL_COMMUNITY)
Admission: RE | Admit: 2012-05-08 | Discharge: 2012-05-08 | Disposition: A | Discharge status: Home / Self Care | Payer: Medicare Other | Source: Ambulatory Visit | Attending: Oncology | Admitting: Oncology

## 2012-05-08 DIAGNOSIS — C50919 Malignant neoplasm of unspecified site of unspecified female breast: Secondary | ICD-10-CM | POA: Insufficient documentation

## 2012-05-08 DIAGNOSIS — I059 Rheumatic mitral valve disease, unspecified: Secondary | ICD-10-CM | POA: Insufficient documentation

## 2012-05-08 DIAGNOSIS — I82409 Acute embolism and thrombosis of unspecified deep veins of unspecified lower extremity: Secondary | ICD-10-CM

## 2012-05-08 DIAGNOSIS — I10 Essential (primary) hypertension: Secondary | ICD-10-CM | POA: Insufficient documentation

## 2012-05-08 DIAGNOSIS — I359 Nonrheumatic aortic valve disorder, unspecified: Secondary | ICD-10-CM | POA: Insufficient documentation

## 2012-05-08 NOTE — Progress Notes (Signed)
  Echocardiogram 2D Echocardiogram has been performed.  [name], [facility] 05/08/2012, 10:19 AM

## 2012-05-09 ENCOUNTER — Telehealth: Payer: Self-pay | Admitting: *Deleted

## 2012-05-09 NOTE — Telephone Encounter (Signed)
PER CS, HAVE NOTIFIED PT TO TAKE COUMADIN 5MG  ALTERNATING WITH 7.5MG  PT VERBALIZED UNDERSTANDING

## 2012-05-17 ENCOUNTER — Other Ambulatory Visit (HOSPITAL_BASED_OUTPATIENT_CLINIC_OR_DEPARTMENT_OTHER): Payer: Medicare Other | Admitting: Lab

## 2012-05-17 ENCOUNTER — Ambulatory Visit (HOSPITAL_BASED_OUTPATIENT_CLINIC_OR_DEPARTMENT_OTHER): Payer: Medicare Other | Admitting: Oncology

## 2012-05-17 ENCOUNTER — Other Ambulatory Visit: Payer: Self-pay | Admitting: Physician Assistant

## 2012-05-17 ENCOUNTER — Ambulatory Visit (HOSPITAL_BASED_OUTPATIENT_CLINIC_OR_DEPARTMENT_OTHER): Payer: Medicare Other

## 2012-05-17 VITALS — BP 136/96 | HR 87 | Temp 97.2°F | Ht 64.5 in | Wt 185.0 lb

## 2012-05-17 DIAGNOSIS — Z5112 Encounter for antineoplastic immunotherapy: Secondary | ICD-10-CM

## 2012-05-17 DIAGNOSIS — I82409 Acute embolism and thrombosis of unspecified deep veins of unspecified lower extremity: Secondary | ICD-10-CM

## 2012-05-17 DIAGNOSIS — C50219 Malignant neoplasm of upper-inner quadrant of unspecified female breast: Secondary | ICD-10-CM

## 2012-05-17 DIAGNOSIS — C50919 Malignant neoplasm of unspecified site of unspecified female breast: Secondary | ICD-10-CM

## 2012-05-17 DIAGNOSIS — G609 Hereditary and idiopathic neuropathy, unspecified: Secondary | ICD-10-CM

## 2012-05-17 DIAGNOSIS — I890 Lymphedema, not elsewhere classified: Secondary | ICD-10-CM

## 2012-05-17 DIAGNOSIS — Z7901 Long term (current) use of anticoagulants: Secondary | ICD-10-CM

## 2012-05-17 LAB — COMPREHENSIVE METABOLIC PANEL
ALT: 14 U/L (ref 0–35)
AST: 36 U/L (ref 0–37)
Albumin: 3.9 g/dL (ref 3.5–5.2)
Alkaline Phosphatase: 93 U/L (ref 39–117)
BUN: 7 mg/dL (ref 6–23)
CO2: 27 mEq/L (ref 19–32)
Calcium: 9.3 mg/dL (ref 8.4–10.5)
Chloride: 100 mEq/L (ref 96–112)
Creatinine, Ser: 0.75 mg/dL (ref 0.50–1.10)
Glucose, Bld: 95 mg/dL (ref 70–99)
Potassium: 3.4 mEq/L — ABNORMAL LOW (ref 3.5–5.3)
Sodium: 141 mEq/L (ref 135–145)
Total Bilirubin: 0.3 mg/dL (ref 0.3–1.2)
Total Protein: 7.4 g/dL (ref 6.0–8.3)

## 2012-05-17 LAB — CBC WITH DIFFERENTIAL/PLATELET
BASO%: 1.2 % (ref 0.0–2.0)
Basophils Absolute: 0.1 10*3/uL (ref 0.0–0.1)
EOS%: 3.1 % (ref 0.0–7.0)
Eosinophils Absolute: 0.2 10*3/uL (ref 0.0–0.5)
HCT: 35.4 % (ref 34.8–46.6)
HGB: 11.5 g/dL — ABNORMAL LOW (ref 11.6–15.9)
LYMPH%: 28.3 % (ref 14.0–49.7)
MCH: 26.9 pg (ref 25.1–34.0)
MCHC: 32.5 g/dL (ref 31.5–36.0)
MCV: 82.9 fL (ref 79.5–101.0)
MONO#: 0.7 10*3/uL (ref 0.1–0.9)
MONO%: 9 % (ref 0.0–14.0)
NEUT#: 4.5 10*3/uL (ref 1.5–6.5)
NEUT%: 58.4 % (ref 38.4–76.8)
Platelets: 456 10*3/uL — ABNORMAL HIGH (ref 145–400)
RBC: 4.27 10*6/uL (ref 3.70–5.45)
RDW: 20.2 % — ABNORMAL HIGH (ref 11.2–14.5)
WBC: 7.7 10*3/uL (ref 3.9–10.3)
lymph#: 2.2 10*3/uL (ref 0.9–3.3)
nRBC: 0 % (ref 0–0)

## 2012-05-17 LAB — PROTIME-INR
INR: 1.8 — ABNORMAL LOW (ref 2.00–3.50)
Protime: 21.6 Seconds — ABNORMAL HIGH (ref 10.6–13.4)

## 2012-05-17 LAB — CANCER ANTIGEN 27.29: CA 27.29: 54 U/mL — ABNORMAL HIGH (ref 0–39)

## 2012-05-17 MED ORDER — ACETAMINOPHEN 325 MG PO TABS
650.0000 mg | ORAL_TABLET | Freq: Once | ORAL | Status: AC
  Administered 2012-05-17: 650 mg via ORAL

## 2012-05-17 MED ORDER — WARFARIN SODIUM 2 MG PO TABS: 2.0000 mg | ORAL_TABLET | Freq: Every day | ORAL | Status: DC

## 2012-05-17 MED ORDER — SODIUM CHLORIDE 0.9 % IV SOLN
Freq: Once | INTRAVENOUS | Status: AC
  Administered 2012-05-17: 11:00:00 via INTRAVENOUS

## 2012-05-17 MED ORDER — SODIUM CHLORIDE 0.9 % IJ SOLN
10.0000 mL | INTRAMUSCULAR | Status: DC | PRN
  Administered 2012-05-17: 10 mL
  Filled 2012-05-17: qty 10

## 2012-05-17 MED ORDER — SODIUM CHLORIDE 0.9 % IV SOLN
320.0000 mg | Freq: Once | INTRAVENOUS | Status: AC
  Administered 2012-05-17: 320 mg via INTRAVENOUS
  Filled 2012-05-17: qty 16

## 2012-05-17 MED ORDER — HEPARIN SOD (PORK) LOCK FLUSH 100 UNIT/ML IV SOLN
500.0000 [IU] | Freq: Once | INTRAVENOUS | Status: AC | PRN
  Administered 2012-05-17: 500 [IU]
  Filled 2012-05-17: qty 5

## 2012-05-17 MED ORDER — DIPHENHYDRAMINE HCL 25 MG PO CAPS
50.0000 mg | ORAL_CAPSULE | Freq: Once | ORAL | Status: AC
  Administered 2012-05-17: 50 mg via ORAL

## 2012-05-17 NOTE — Progress Notes (Signed)
1225-Pt refuses to stay for 30 minute observation post Kadcyla.  VS stable and patient without complaints at this time.

## 2012-05-17 NOTE — Patient Instructions (Addendum)
TAKE 7 MG OF COUMADIN ONE DAY, 6 MG OF COUMADIN THE NEXT DAY.  YOU 5MG  TABLETS AND 2 MG TABLETS; BREAK THE 2Mg  PILL IN HALF.

## 2012-05-17 NOTE — Patient Instructions (Addendum)
[facility] Discharge Instructions for Patients Receiving Chemotherapy  Today you received the following chemotherapy agents Kadcyla.  To help prevent nausea and vomiting after your treatment, we encourage you to take your nausea medication as ordered per MD.    If you develop nausea and vomiting that is not controlled by your nausea medication, call the clinic. If it is after clinic hours your family physician or the after hours number for the clinic or go to the Emergency Department.   BELOW ARE SYMPTOMS THAT SHOULD BE REPORTED IMMEDIATELY:  *FEVER GREATER THAN 100.5 F  *CHILLS WITH OR WITHOUT FEVER  NAUSEA AND VOMITING THAT IS NOT CONTROLLED WITH YOUR NAUSEA MEDICATION  *UNUSUAL SHORTNESS OF BREATH  *UNUSUAL BRUISING OR BLEEDING  TENDERNESS IN MOUTH AND THROAT WITH OR WITHOUT PRESENCE OF ULCERS  *URINARY PROBLEMS  *BOWEL PROBLEMS  UNUSUAL RASH Items with * indicate a potential emergency and should be followed up as soon as possible.  . Please let the nurse know about any problems that you may have experienced. Feel free to call the clinic you have any questions or concerns. The clinic phone number is [phone].   I have been informed and understand all the instructions given to me. I know to contact the clinic, my physician, or go to the Emergency Department if any problems should occur. I do not have any questions at this time, but understand that I may call the clinic during office hours   should I have any questions or need assistance in obtaining follow up care.    __________________________________________  _____________  __________ Signature of Patient or Authorized Representative            Date                   Time    __________________________________________ Nurse's Signature    

## 2012-05-17 NOTE — Progress Notes (Signed)
Hematology and Oncology Follow Up Visit  [name] [id number] 10-17-1956 56 y.o. 05/17/2012    HPI: Patient is a 56 year old [place] woman with metastatic HER-2/neu positive breast carcinoma. She is currently  day 1 cycle 5 of every 3 week Kadcyla given in the palliative setting, evidence to suggest possible improvement of symptomatology.  2. History of deep vein thrombosis of the right upper extremity with associated significant lymphedema on Coumadin therapy 7.5/5 mg daily.  3. Persistent right upper arm lymphedema, active treatment to the lymphedema clinic.  Interim History:   [name] is seen today for followup every 3 week Kadcyla. , DUE FOR CYCLE 6 A detailed review of systems is otherwise noncontributory as noted below.  Review of Systems: Constitutional:  no weight loss, fever, night sweats and fatigued Eyes: no complaints ENT: no complaints Cardiovascular: no chest pain or dyspnea on exertion Respiratory: no cough, shortness of breath, or wheezing Neurological: no TIA or stroke symptoms Dermatological: positive for lumps Gastrointestinal: no abdominal pain, change in bowel habits, or black or bloody stools Genito-Urinary: no dysuria, trouble voiding, or hematuria Hematological and Lymphatic: negative Breast: negative Musculoskeletal: negative Remaining ROS negative.   Medications:   I have reviewed the patient's current medications.  Current Outpatient Prescriptions  Medication Sig Dispense Refill  . ALPRAZolam (XANAX) 1 MG tablet Take 1 mg by mouth 3 (three) times daily as needed. For anxiety      . amLODipine (NORVASC) 10 MG tablet Take 10 mg by mouth daily.        . Ascorbic Acid (VITAMIN C) 100 MG tablet Take 100 mg by mouth daily.        . calcium-vitamin D (OSCAL WITH D) 500-200 MG-UNIT per tablet Take 1 tablet by mouth daily.        . Multiple Vitamin (MULTIVITAMIN) capsule Take 1 capsule by mouth daily.        Marland Kitchen warfarin (COUMADIN) 5 MG  tablet Take 2 tablets (10 mg total) by mouth daily. Or as directed  60 tablet  2  . gabapentin (NEURONTIN) 100 MG capsule Take 100 mg by mouth at bedtime.      . hydrochlorothiazide (HYDRODIURIL) 25 MG tablet TAKE 1 TABLET BY MOUTH ON MONDAY, WEDNESDAY, AND FRIDAY  30 tablet  0  . ibuprofen (ADVIL,MOTRIN) 200 MG tablet Take 400 mg by mouth every 6 (six) hours as needed. For pain       . ondansetron (ZOFRAN-ODT) 4 MG disintegrating tablet Take 4 mg by mouth every 8 (eight) hours as needed. For nausea       . promethazine-codeine (PHENERGAN WITH CODEINE) 6.25-10 MG/5ML syrup po q 6-8 hrs prn cough  200 mL  0  . warfarin (COUMADIN) 2 MG tablet Take 1 tablet (2 mg total) by mouth daily.  30 tablet  2    Allergies:  Allergies  Allergen Reactions  . Hydrocodone-Acetaminophen Itching and Rash    Physical Exam: Filed Vitals:   05/17/12 1010  BP: 136/96  Pulse: 87  Temp: 97.2 F (36.2 C)  Weight: 188 lbs. HEENT:  Sclerae anicteric, conjunctivae pink.  Oropharynx clear.  No mucositis or candidiasis.   Nodes: The right upper anterior chest wall continues to significantly improve.  I really am unable to feel any subcutaneous nodules, more like discrete thickening. Breast Exam: Deferred.   Lungs:  Clear to auscultation without wheezing or rhonchi appreciated. Heart:  Regular rate and rhythm.   Abdomen:  Soft, nontender.  Positive bowel sounds.  No  organomegaly or masses palpated.   Musculoskeletal:  No focal spinal tenderness to palpation.  Extremities: Evidence to suggest significant improvement of right arm lymphedema. Skin:  Benign.   Neuro:  Nonfocal, alert and oriented x 3.  Lab Results: Lab Results  Component Value Date   WBC 7.7 05/17/2012   HGB 11.5* 05/17/2012   HCT 35.4 05/17/2012   MCV 82.9 05/17/2012   PLT 456* 05/17/2012   NEUTROABS 4.5 05/17/2012     Chemistry      Component Value Date/Time   NA 142 04/26/2012 1055   K 3.6 04/26/2012 1055   CL 100 04/26/2012 1055   CO2  27 04/26/2012 1055   BUN 8 04/26/2012 1055   CREATININE 0.71 04/26/2012 1055      Component Value Date/Time   CALCIUM 9.7 04/26/2012 1055   ALKPHOS 85 04/26/2012 1055   AST 33 04/26/2012 1055   ALT 18 04/26/2012 1055   BILITOT 0.3 04/26/2012 1055     2D Echocardiogram: 01/19/12 Study Conclusions - Left ventricle: The cavity size was normal. Systolic function was normal. The estimated ejection fraction was in the range of 55% to 60%. Wall motion was normal; there were no regional wall motion abnormalities. - Aortic valve: Mild regurgitation. - Left atrium: The atrium was mildly dilated. Transthoracic echocardiography. M-mode, complete 2D, spectral Doppler, and color Doppler. Height: Height: 167.6cm. Height: 66in. Weight: Weight: 90.7kg. Weight: 199.6lb. Body mass index: BMI: 32.3kg/m^2. Body surface area: BSA: 51m^2. Patient status: Outpatient. Location: Echo laboratory. Prepared and Electronically Authenticated by Georga Hacking, MD [facility]     Assessment:  Patient is a 56 year old [place] woman with metastatic HER-2/neu positive breast carcinoma. She is currently day 1 cycle 6 of every 3 week Kadcyla given in the palliative setting, evidence of profound disease regression.  2. History of deep vein thrombosis of the right upper extremity with associated significant lymphedema We discussed changing her Coumadin dosing to 7 mg alternating with 6 mg, half a call in 2 mg tablets. We'll check it next week. 3. Persistent right upper arm lymphedema, active treatment to the lymphedema clinic, with significant improvement noted.  Evidence of mild peripheral neuropathy reported.  4. Bronchitis    Plan:  Continue every 3 week kadcyla,  Excellent clinical response with almost total resolution of her chest wall disease. DVT alternate 7 and 6 mg coumadin.  Pierce Crane, MD 05/17/2012

## 2012-05-23 ENCOUNTER — Other Ambulatory Visit: Payer: Self-pay | Admitting: Physician Assistant

## 2012-05-23 DIAGNOSIS — I82409 Acute embolism and thrombosis of unspecified deep veins of unspecified lower extremity: Secondary | ICD-10-CM

## 2012-05-23 DIAGNOSIS — C50919 Malignant neoplasm of unspecified site of unspecified female breast: Secondary | ICD-10-CM

## 2012-05-24 ENCOUNTER — Other Ambulatory Visit (HOSPITAL_BASED_OUTPATIENT_CLINIC_OR_DEPARTMENT_OTHER): Payer: Medicare Other | Admitting: Lab

## 2012-05-24 ENCOUNTER — Other Ambulatory Visit: Payer: Self-pay | Admitting: Oncology

## 2012-05-24 ENCOUNTER — Ambulatory Visit (HOSPITAL_BASED_OUTPATIENT_CLINIC_OR_DEPARTMENT_OTHER): Payer: Medicare Other | Admitting: Physician Assistant

## 2012-05-24 VITALS — BP 130/83 | HR 91 | Temp 98.5°F | Ht 64.5 in | Wt 188.1 lb

## 2012-05-24 DIAGNOSIS — Z7901 Long term (current) use of anticoagulants: Secondary | ICD-10-CM

## 2012-05-24 DIAGNOSIS — I890 Lymphedema, not elsewhere classified: Secondary | ICD-10-CM

## 2012-05-24 DIAGNOSIS — C792 Secondary malignant neoplasm of skin: Secondary | ICD-10-CM

## 2012-05-24 DIAGNOSIS — C50919 Malignant neoplasm of unspecified site of unspecified female breast: Secondary | ICD-10-CM

## 2012-05-24 DIAGNOSIS — I82409 Acute embolism and thrombosis of unspecified deep veins of unspecified lower extremity: Secondary | ICD-10-CM

## 2012-05-24 DIAGNOSIS — G609 Hereditary and idiopathic neuropathy, unspecified: Secondary | ICD-10-CM

## 2012-05-24 DIAGNOSIS — C50219 Malignant neoplasm of upper-inner quadrant of unspecified female breast: Secondary | ICD-10-CM

## 2012-05-24 LAB — CBC WITH DIFFERENTIAL/PLATELET
BASO%: 0.3 % (ref 0.0–2.0)
Basophils Absolute: 0 10*3/uL (ref 0.0–0.1)
EOS%: 6 % (ref 0.0–7.0)
Eosinophils Absolute: 0.4 10*3/uL (ref 0.0–0.5)
HCT: 33.5 % — ABNORMAL LOW (ref 34.8–46.6)
HGB: 10.6 g/dL — ABNORMAL LOW (ref 11.6–15.9)
LYMPH%: 34.8 % (ref 14.0–49.7)
MCH: 26.7 pg (ref 25.1–34.0)
MCHC: 31.6 g/dL (ref 31.5–36.0)
MCV: 84.4 fL (ref 79.5–101.0)
MONO#: 0.8 10*3/uL (ref 0.1–0.9)
MONO%: 11.5 % (ref 0.0–14.0)
NEUT#: 3.1 10*3/uL (ref 1.5–6.5)
NEUT%: 47.4 % (ref 38.4–76.8)
Platelets: 147 10*3/uL (ref 145–400)
RBC: 3.97 10*6/uL (ref 3.70–5.45)
RDW: 19.3 % — ABNORMAL HIGH (ref 11.2–14.5)
WBC: 6.5 10*3/uL (ref 3.9–10.3)
lymph#: 2.3 10*3/uL (ref 0.9–3.3)
nRBC: 0 % (ref 0–0)

## 2012-05-24 LAB — TECHNOLOGIST REVIEW

## 2012-05-24 LAB — PROTIME-INR
INR: 2.6 (ref 2.00–3.50)
Protime: 31.2 Seconds — ABNORMAL HIGH (ref 10.6–13.4)

## 2012-05-24 NOTE — Progress Notes (Signed)
Hematology and Oncology Follow Up Visit  [name] [id number] 1956/10/01 56 y.o. 05/24/2012    HPI: Patient is a 56 year old [place] woman with metastatic HER-2/neu positive breast carcinoma. She is currently  day 7 cycle 6 of every 3 week Kadcyla given in the palliative setting, evidence to suggest possible improvement of symptomatology.  2. History of deep vein thrombosis of the right upper extremity with associated significant lymphedema on Coumadin therapy 5 mg daily.  3. Persistent right upper arm lymphedema, active treatment to the lymphedema clinic.  Interim History:   [name] is seen today for followup after her sixth cycle of palliative every 3 week Kadcyla.  She continues to improve. She denies any nausea, emesis, diarrhea or constipation issues. She does admit to some numbness and tingling of the right upper extremity at the fingertips.  She has not appreciated any bleeding or bruising. She is a bit fatigued today, but overall does pretty well.  A detailed review of systems is otherwise noncontributory as noted below.  Review of Systems: Constitutional:  no weight loss, fever, night sweats and fatigued Eyes: no complaints ENT: no complaints Cardiovascular: no chest pain or dyspnea on exertion Respiratory: no cough, shortness of breath, or wheezing Neurological: no TIA or stroke symptoms Dermatological: positive for lumps Gastrointestinal: no abdominal pain, change in bowel habits, or black or bloody stools Genito-Urinary: no dysuria, trouble voiding, or hematuria Hematological and Lymphatic: negative Breast: negative Musculoskeletal: negative Remaining ROS negative.   Medications:   I have reviewed the patient's current medications.  Current Outpatient Prescriptions  Medication Sig Dispense Refill  . ALPRAZolam (XANAX) 1 MG tablet Take 1 mg by mouth 3 (three) times daily as needed. For anxiety      . amLODipine (NORVASC) 10 MG tablet Take 10 mg  by mouth daily.        . Ascorbic Acid (VITAMIN C) 100 MG tablet Take 100 mg by mouth daily.        . calcium-vitamin D (OSCAL WITH D) 500-200 MG-UNIT per tablet Take 1 tablet by mouth daily.        Marland Kitchen gabapentin (NEURONTIN) 100 MG capsule Take 100 mg by mouth at bedtime.      . hydrochlorothiazide (HYDRODIURIL) 25 MG tablet TAKE 1 TABLET BY MOUTH ON MONDAY, WEDNESDAY, AND FRIDAY  30 tablet  0  . ibuprofen (ADVIL,MOTRIN) 200 MG tablet Take 400 mg by mouth every 6 (six) hours as needed. For pain       . Multiple Vitamin (MULTIVITAMIN) capsule Take 1 capsule by mouth daily.        . ondansetron (ZOFRAN-ODT) 4 MG disintegrating tablet Take 4 mg by mouth every 8 (eight) hours as needed. For nausea       . promethazine-codeine (PHENERGAN WITH CODEINE) 6.25-10 MG/5ML syrup po q 6-8 hrs prn cough  200 mL  0  . warfarin (COUMADIN) 2 MG tablet Take 1 tablet (2 mg total) by mouth daily.  30 tablet  2  . warfarin (COUMADIN) 5 MG tablet Take 2 tablets (10 mg total) by mouth daily. Or as directed  60 tablet  2    Allergies:  Allergies  Allergen Reactions  . Hydrocodone-Acetaminophen Itching and Rash    Physical Exam: Filed Vitals:   05/24/12 1102  BP: 130/83  Pulse: 91  Temp: 98.5 F (36.9 C)  Weight: 188 lbs. HEENT:  Sclerae anicteric, conjunctivae pink.  Oropharynx clear.  No mucositis or candidiasis.   Nodes: The right upper anterior chest  wall continues to significantly improve.  I really am unable to feel any subcutaneous nodules, more like discrete thickening. Breast Exam: Deferred.   Lungs:  Clear to auscultation without wheezing or rhonchi appreciated. Heart:  Regular rate and rhythm.   Abdomen:  Soft, nontender.  Positive bowel sounds.  No organomegaly or masses palpated.   Musculoskeletal:  No focal spinal tenderness to palpation.  Extremities: Evidence to suggest significant improvement of right arm lymphedema. Skin:  Benign.   Neuro:  Nonfocal, alert and oriented x 3.  Lab  Results: Lab Results  Component Value Date   WBC 6.5 05/24/2012   HGB 10.6* 05/24/2012   HCT 33.5* 05/24/2012   MCV 84.4 05/24/2012   PLT 147 05/24/2012   NEUTROABS 3.1 05/24/2012     Chemistry      Component Value Date/Time   NA 141 05/17/2012 0949   K 3.4* 05/17/2012 0949   CL 100 05/17/2012 0949   CO2 27 05/17/2012 0949   BUN 7 05/17/2012 0949   CREATININE 0.75 05/17/2012 0949      Component Value Date/Time   CALCIUM 9.3 05/17/2012 0949   ALKPHOS 93 05/17/2012 0949   AST 36 05/17/2012 0949   ALT 14 05/17/2012 0949   BILITOT 0.3 05/17/2012 0949     2D Echocardiogram: 01/19/12 Study Conclusions - Left ventricle: The cavity size was normal. Systolic function was normal. The estimated ejection fraction was in the range of 55% to 60%. Wall motion was normal; there were no regional wall motion abnormalities. - Aortic valve: Mild regurgitation. - Left atrium: The atrium was mildly dilated. Transthoracic echocardiography. M-mode, complete 2D, spectral Doppler, and color Doppler. Height: Height: 167.6cm. Height: 66in. Weight: Weight: 90.7kg. Weight: 199.6lb. Body mass index: BMI: 32.3kg/m^2. Body surface area: BSA: 68m^2. Patient status: Outpatient. Location: Echo laboratory. Prepared and Electronically Authenticated by Georga Hacking, MD [name]     Assessment:  Patient is a 56 year old [place] woman with metastatic HER-2/neu positive breast carcinoma. She is currently day 7 cycle 6 of every 3 week Kadcyla given in the palliative setting, evidence of profound disease regression.  2. History of deep vein thrombosis of the right upper extremity with associated significant lymphedema on Coumadin 7.0, 6.0 and 5.0 mg sequential days with a therapeutic INR.  3. Persistent right upper arm lymphedema, active treatment to the lymphedema clinic, with significant improvement noted.  Evidence of mild peripheral neuropathy reported.  4. Bronchitis  Case reviewed with Dr. Pierce Crane.  Plan:  [name] will return on 06/08/12 for follow up prior to day 1 cycle 7 of every 3 week Kadcyla in the palliative setting for progressive metastatic HER-2 positive breast cancer. She will continue on her current Coumadin dose.  She will have a repeat CBC and PT/INR on 06/08/12.  Refill for her Phenergan with codeine cough syrup and oxycodone 5 mg tablets were provided. This plan was reviewed with the patient, who voices understanding and agreement.  She knows to call with any changes or problems.    Virl Coble T, PA-C 05/24/2012

## 2012-06-06 ENCOUNTER — Encounter: Payer: Self-pay | Admitting: *Deleted

## 2012-06-06 NOTE — Progress Notes (Unsigned)
Have notified pt of appointment 06/07/12 1045.

## 2012-06-07 ENCOUNTER — Ambulatory Visit (HOSPITAL_BASED_OUTPATIENT_CLINIC_OR_DEPARTMENT_OTHER): Payer: Medicare Other

## 2012-06-07 ENCOUNTER — Ambulatory Visit: Payer: Medicare Other | Admitting: Oncology

## 2012-06-07 ENCOUNTER — Other Ambulatory Visit (HOSPITAL_BASED_OUTPATIENT_CLINIC_OR_DEPARTMENT_OTHER): Payer: Medicare Other | Admitting: Lab

## 2012-06-07 ENCOUNTER — Encounter: Payer: Self-pay | Admitting: Family

## 2012-06-07 ENCOUNTER — Ambulatory Visit (HOSPITAL_BASED_OUTPATIENT_CLINIC_OR_DEPARTMENT_OTHER): Payer: Medicare Other | Admitting: Family

## 2012-06-07 VITALS — BP 116/80 | HR 70 | Temp 98.8°F | Ht 64.5 in | Wt 183.2 lb

## 2012-06-07 DIAGNOSIS — C50919 Malignant neoplasm of unspecified site of unspecified female breast: Secondary | ICD-10-CM

## 2012-06-07 DIAGNOSIS — Z86718 Personal history of other venous thrombosis and embolism: Secondary | ICD-10-CM

## 2012-06-07 DIAGNOSIS — Z5181 Encounter for therapeutic drug level monitoring: Secondary | ICD-10-CM

## 2012-06-07 DIAGNOSIS — Z7901 Long term (current) use of anticoagulants: Secondary | ICD-10-CM

## 2012-06-07 DIAGNOSIS — C50219 Malignant neoplasm of upper-inner quadrant of unspecified female breast: Secondary | ICD-10-CM

## 2012-06-07 DIAGNOSIS — Z5112 Encounter for antineoplastic immunotherapy: Secondary | ICD-10-CM

## 2012-06-07 DIAGNOSIS — I890 Lymphedema, not elsewhere classified: Secondary | ICD-10-CM

## 2012-06-07 DIAGNOSIS — I82409 Acute embolism and thrombosis of unspecified deep veins of unspecified lower extremity: Secondary | ICD-10-CM

## 2012-06-07 LAB — CBC WITH DIFFERENTIAL/PLATELET
BASO%: 0.9 % (ref 0.0–2.0)
Basophils Absolute: 0.1 10*3/uL (ref 0.0–0.1)
EOS%: 2.3 % (ref 0.0–7.0)
Eosinophils Absolute: 0.2 10*3/uL (ref 0.0–0.5)
HCT: 38.4 % (ref 34.8–46.6)
HGB: 12.6 g/dL (ref 11.6–15.9)
LYMPH%: 22.9 % (ref 14.0–49.7)
MCH: 27 pg (ref 25.1–34.0)
MCHC: 32.8 g/dL (ref 31.5–36.0)
MCV: 82.4 fL (ref 79.5–101.0)
MONO#: 0.8 10*3/uL (ref 0.1–0.9)
MONO%: 8.5 % (ref 0.0–14.0)
NEUT#: 6 10*3/uL (ref 1.5–6.5)
NEUT%: 65.4 % (ref 38.4–76.8)
Platelets: 319 10*3/uL (ref 145–400)
RBC: 4.66 10*6/uL (ref 3.70–5.45)
RDW: 18.9 % — ABNORMAL HIGH (ref 11.2–14.5)
WBC: 9.1 10*3/uL (ref 3.9–10.3)
lymph#: 2.1 10*3/uL (ref 0.9–3.3)
nRBC: 0 % (ref 0–0)

## 2012-06-07 LAB — PROTIME-INR
INR: 1 — ABNORMAL LOW (ref 2.00–3.50)
Protime: 12 Seconds (ref 10.6–13.4)

## 2012-06-07 LAB — COMPREHENSIVE METABOLIC PANEL
ALT: 21 U/L (ref 0–35)
AST: 49 U/L — ABNORMAL HIGH (ref 0–37)
Albumin: 4 g/dL (ref 3.5–5.2)
Alkaline Phosphatase: 100 U/L (ref 39–117)
BUN: 9 mg/dL (ref 6–23)
CO2: 31 mEq/L (ref 19–32)
Calcium: 9.4 mg/dL (ref 8.4–10.5)
Chloride: 99 mEq/L (ref 96–112)
Creatinine, Ser: 0.67 mg/dL (ref 0.50–1.10)
Glucose, Bld: 98 mg/dL (ref 70–99)
Potassium: 3.6 mEq/L (ref 3.5–5.3)
Sodium: 139 mEq/L (ref 135–145)
Total Bilirubin: 0.3 mg/dL (ref 0.3–1.2)
Total Protein: 7.3 g/dL (ref 6.0–8.3)

## 2012-06-07 MED ORDER — SODIUM CHLORIDE 0.9 % IJ SOLN
10.0000 mL | INTRAMUSCULAR | Status: DC | PRN
  Administered 2012-06-07: 10 mL
  Filled 2012-06-07: qty 10

## 2012-06-07 MED ORDER — SODIUM CHLORIDE 0.9 % IV SOLN
320.0000 mg | Freq: Once | INTRAVENOUS | Status: AC
  Administered 2012-06-07: 320 mg via INTRAVENOUS
  Filled 2012-06-07: qty 16

## 2012-06-07 MED ORDER — ACETAMINOPHEN 325 MG PO TABS
650.0000 mg | ORAL_TABLET | Freq: Once | ORAL | Status: AC
  Administered 2012-06-07: 650 mg via ORAL

## 2012-06-07 MED ORDER — HEPARIN SOD (PORK) LOCK FLUSH 100 UNIT/ML IV SOLN
500.0000 [IU] | Freq: Once | INTRAVENOUS | Status: AC | PRN
  Administered 2012-06-07: 500 [IU]
  Filled 2012-06-07: qty 5

## 2012-06-07 MED ORDER — SODIUM CHLORIDE 0.9 % IV SOLN
Freq: Once | INTRAVENOUS | Status: AC
  Administered 2012-06-07: 12:00:00 via INTRAVENOUS

## 2012-06-07 MED ORDER — DIPHENHYDRAMINE HCL 25 MG PO CAPS
50.0000 mg | ORAL_CAPSULE | Freq: Once | ORAL | Status: AC
  Administered 2012-06-07: 50 mg via ORAL

## 2012-06-07 NOTE — Progress Notes (Signed)
Hematology and Oncology Follow Up Visit  [name] [id number] 12-05-1955 56 y.o. 06/07/2012    HPI: Patient is a 56 year old [place] woman with metastatic HER-2/neu positive left breast carcinoma. She is currently on every 3 week Kadcyla given in the palliative setting, evidence to suggest possible improvement of symptomatology.  2. History of deep vein thrombosis of the right upper extremity with associated significant lymphedema on Coumadin therapy 5 mg daily.  3. Persistent right upper arm lymphedema, active treatment to the lymphedema clinic.  Interim History:   [name] is seen today for seventhth cycle of palliative every 3 week Kadcyla.  She continues to improve. Has transient nausea, uses Ativan. No eemesis, diarrhea or constipation issues. Reports numbness and tingling of the right upper extremity at the fingertips.  No abnormal bleeding or bruising. No headache or blurred vision. No cough or shortness of breath. No abdominal pain or new bone pain.  Appetite is good, with adequate fluid intake. Remainder of the 10 point  review of systems is negative.   She is a bit fatigued today and "down" today, but overall does pretty well. She is accompanied by her daughter.   She remains on Coumadin for history DVT, right upper extremity. PT/INR pending.  A detailed review of systems is otherwise noncontributory.  Medications:   I have reviewed the patient's current medications.  Current Outpatient Prescriptions  Medication Sig Dispense Refill  . ALPRAZolam (XANAX) 1 MG tablet Take 1 mg by mouth 3 (three) times daily as needed. For anxiety      . amLODipine (NORVASC) 10 MG tablet Take 10 mg by mouth daily.        . Ascorbic Acid (VITAMIN C) 100 MG tablet Take 100 mg by mouth daily.        . calcium-vitamin D (OSCAL WITH D) 500-200 MG-UNIT per tablet Take 1 tablet by mouth daily.        Marland Kitchen gabapentin (NEURONTIN) 100 MG capsule Take 100 mg by mouth at bedtime.      .  hydrochlorothiazide (HYDRODIURIL) 25 MG tablet TAKE 1 TABLET BY MOUTH ON MONDAY, WEDNESDAY, AND FRIDAY  30 tablet  0  . ibuprofen (ADVIL,MOTRIN) 200 MG tablet Take 400 mg by mouth every 6 (six) hours as needed. For pain       . Multiple Vitamin (MULTIVITAMIN) capsule Take 1 capsule by mouth daily.        . ondansetron (ZOFRAN-ODT) 4 MG disintegrating tablet Take 4 mg by mouth every 8 (eight) hours as needed. For nausea       . promethazine-codeine (PHENERGAN WITH CODEINE) 6.25-10 MG/5ML syrup po q 6-8 hrs prn cough  200 mL  0  . warfarin (COUMADIN) 2 MG tablet Take 1 tablet (2 mg total) by mouth daily.  30 tablet  2  . warfarin (COUMADIN) 5 MG tablet Take 2 tablets (10 mg total) by mouth daily. Or as directed  60 tablet  2   No current facility-administered medications for this visit.   Facility-Administered Medications Ordered in Other Visits  Medication Dose Route Frequency Provider Last Rate Last Dose  . 0.9 %  sodium chloride infusion   Intravenous Once Pierce Crane, MD      . acetaminophen (TYLENOL) tablet 650 mg  650 mg Oral Once Pierce Crane, MD   650 mg at 06/07/12 1217  . ado-trastuzumab emtansine (KADCYLA) 320 mg in sodium chloride 0.9 % 250 mL chemo infusion  320 mg Intravenous Once Pierce Crane, MD   320  mg at 06/07/12 1245  . diphenhydrAMINE (BENADRYL) capsule 50 mg  50 mg Oral Once Pierce Crane, MD   50 mg at 06/07/12 1216  . heparin lock flush 100 unit/mL  500 Units Intracatheter Once PRN Pierce Crane, MD   500 Units at 06/07/12 1333  . sodium chloride 0.9 % injection 10 mL  10 mL Intracatheter PRN Pierce Crane, MD   10 mL at 06/07/12 1333    Allergies:  Allergies  Allergen Reactions  . Hydrocodone-Acetaminophen Itching and Rash    Physical Exam: Filed Vitals:   06/07/12 1111  BP: 116/80  Pulse: 70  Temp: 98.8 F (37.1 C)  Weight: 188 lbs. HEENT:  Sclerae anicteric, conjunctivae pink.  Oropharynx clear.  No mucositis or candidiasis.   Nodes: The right upper anterior  chest wall continues to significantly improve.  No obvious subcutaneous nodules. Skin is thickened, no redness or edema. No palpable supraclavicular, infraclavicular or axillary nodes.  Breast Exam: Bilateral mastectomies. No nodularity or redness of the skin.    Lungs:  Clear to auscultation without wheezing or rhonchi. Heart:  Regular rate and rhythm.   Abdomen:  Soft, nontender.  Positive bowel sounds.  No organomegaly or masses palpated.   Musculoskeletal:  No focal spinal tenderness to palpation.  Extremities: Significant improvement of right arm lymphedema. Compression sleeve in place.  Skin:  Benign.   Neuro:  Nonfocal, alert and oriented x 3.  Lab Results: Lab Results  Component Value Date   WBC 9.1 06/07/2012   HGB 12.6 06/07/2012   HCT 38.4 06/07/2012   MCV 82.4 06/07/2012   PLT 319 06/07/2012   NEUTROABS 6.0 06/07/2012     Chemistry      Component Value Date/Time   NA 141 05/17/2012 0949   K 3.4* 05/17/2012 0949   CL 100 05/17/2012 0949   CO2 27 05/17/2012 0949   BUN 7 05/17/2012 0949   CREATININE 0.75 05/17/2012 0949      Component Value Date/Time   CALCIUM 9.3 05/17/2012 0949   ALKPHOS 93 05/17/2012 0949   AST 36 05/17/2012 0949   ALT 14 05/17/2012 0949   BILITOT 0.3 05/17/2012 0949       Assessment:  Patient is a 56 year old [place] woman with metastatic HER-2/neu positive breast carcinoma. She is currently day 1 cycle 7 of every 3 week Kadcyla given in the palliative setting, evidence of profound disease regression.  2. History of deep vein thrombosis of the right upper extremity with associated significant lymphedema on Coumadin 7.0, 6.0 and 5.0 mg sequential days with INR pending.   3. Persistent right upper arm lymphedema, active treatment to the lymphedema clinic, with significant improvement noted. Wears compression sleeve.  Evidence of mild peripheral neuropathy reported.  4. Mild depression.   Plan:  1. [name] will return on 06/14/12 for follow up  with Dr. Donnie Coffin and lab work. 2. She will continue on her current Coumadin dose.   3. Standing labs are entered for CBC weekly as needed and PT/INR with each chemotherapy (every three weeks).   This plan was reviewed with the patient, who voices understanding and agreement.  She knows to call with any changes or problems.    Colman Cater, NP-C  06/07/2012

## 2012-06-07 NOTE — Patient Instructions (Signed)
[facility] Discharge Instructions for Patients Receiving Chemotherapy  Today you received the following chemotherapy agents Kadayla  To help prevent nausea and vomiting after your treatment, we encourage you to take your nausea medication  Begin taking it at 7 pm and take it as often as prescribed for the next 24 to 72 hours.   If you develop nausea and vomiting that is not controlled by your nausea medication, call the clinic. If it is after clinic hours your family physician or the after hours number for the clinic or go to the Emergency Department.   BELOW ARE SYMPTOMS THAT SHOULD BE REPORTED IMMEDIATELY:  *FEVER GREATER THAN 100.5 F  *CHILLS WITH OR WITHOUT FEVER  NAUSEA AND VOMITING THAT IS NOT CONTROLLED WITH YOUR NAUSEA MEDICATION  *UNUSUAL SHORTNESS OF BREATH  *UNUSUAL BRUISING OR BLEEDING  TENDERNESS IN MOUTH AND THROAT WITH OR WITHOUT PRESENCE OF ULCERS  *URINARY PROBLEMS  *BOWEL PROBLEMS  UNUSUAL RASH Items with * indicate a potential emergency and should be followed up as soon as possible.  One of the nurses will contact you 24 hours after your treatment. Please let the nurse know about any problems that you may have experienced. Feel free to call the clinic you have any questions or concerns. The clinic phone number is [phone].   I have been informed and understand all the instructions given to me. I know to contact the clinic, my physician, or go to the Emergency Department if any problems should occur. I do not have any questions at this time, but understand that I may call the clinic during office hours   should I have any questions or need assistance in obtaining follow up care.    __________________________________________  _____________  __________ Signature of Patient or Authorized Representative            Date                   Time    __________________________________________ Nurse's Signature

## 2012-06-07 NOTE — Patient Instructions (Signed)
Return 8/8 for appt with Dr. Donnie Coffin. Next chemo will be 8/22.  Go to lab for INR check.

## 2012-06-11 ENCOUNTER — Other Ambulatory Visit: Payer: Self-pay | Admitting: *Deleted

## 2012-06-11 ENCOUNTER — Encounter: Payer: Self-pay | Admitting: *Deleted

## 2012-06-11 DIAGNOSIS — I82409 Acute embolism and thrombosis of unspecified deep veins of unspecified lower extremity: Secondary | ICD-10-CM

## 2012-06-11 MED ORDER — WARFARIN SODIUM 1 MG PO TABS: 1.0000 mg | ORAL_TABLET | ORAL | Status: DC

## 2012-06-11 NOTE — Progress Notes (Unsigned)
Per NR, have notified pt to take 10mg  coumadin tonight only then   Alternate with 7mg  and 6mg  of coumadin will recheck labs on Thursday 06/14/12

## 2012-06-14 ENCOUNTER — Other Ambulatory Visit (HOSPITAL_BASED_OUTPATIENT_CLINIC_OR_DEPARTMENT_OTHER): Payer: Medicare Other | Admitting: Lab

## 2012-06-14 ENCOUNTER — Ambulatory Visit (HOSPITAL_BASED_OUTPATIENT_CLINIC_OR_DEPARTMENT_OTHER): Payer: Medicare Other | Admitting: Family

## 2012-06-14 ENCOUNTER — Telehealth: Payer: Self-pay | Admitting: Oncology

## 2012-06-14 VITALS — BP 106/77 | HR 86 | Temp 98.6°F | Resp 20 | Ht 64.5 in | Wt 184.2 lb

## 2012-06-14 DIAGNOSIS — Z86718 Personal history of other venous thrombosis and embolism: Secondary | ICD-10-CM

## 2012-06-14 DIAGNOSIS — Z5181 Encounter for therapeutic drug level monitoring: Secondary | ICD-10-CM

## 2012-06-14 DIAGNOSIS — I82409 Acute embolism and thrombosis of unspecified deep veins of unspecified lower extremity: Secondary | ICD-10-CM

## 2012-06-14 DIAGNOSIS — C50219 Malignant neoplasm of upper-inner quadrant of unspecified female breast: Secondary | ICD-10-CM

## 2012-06-14 DIAGNOSIS — Z7901 Long term (current) use of anticoagulants: Secondary | ICD-10-CM

## 2012-06-14 DIAGNOSIS — Z170 Estrogen receptor positive status [ER+]: Secondary | ICD-10-CM

## 2012-06-14 DIAGNOSIS — C50919 Malignant neoplasm of unspecified site of unspecified female breast: Secondary | ICD-10-CM

## 2012-06-14 DIAGNOSIS — J069 Acute upper respiratory infection, unspecified: Secondary | ICD-10-CM

## 2012-06-14 DIAGNOSIS — I890 Lymphedema, not elsewhere classified: Secondary | ICD-10-CM

## 2012-06-14 LAB — CBC WITH DIFFERENTIAL/PLATELET
BASO%: 0.8 % (ref 0.0–2.0)
Basophils Absolute: 0.1 10*3/uL (ref 0.0–0.1)
EOS%: 2.7 % (ref 0.0–7.0)
Eosinophils Absolute: 0.2 10*3/uL (ref 0.0–0.5)
HCT: 38 % (ref 34.8–46.6)
HGB: 12.5 g/dL (ref 11.6–15.9)
LYMPH%: 35.2 % (ref 14.0–49.7)
MCH: 26.8 pg (ref 25.1–34.0)
MCHC: 32.9 g/dL (ref 31.5–36.0)
MCV: 81.5 fL (ref 79.5–101.0)
MONO#: 0.7 10*3/uL (ref 0.1–0.9)
MONO%: 10.5 % (ref 0.0–14.0)
NEUT#: 3.2 10*3/uL (ref 1.5–6.5)
NEUT%: 50.8 % (ref 38.4–76.8)
Platelets: 129 10*3/uL — ABNORMAL LOW (ref 145–400)
RBC: 4.66 10*6/uL (ref 3.70–5.45)
RDW: 18.1 % — ABNORMAL HIGH (ref 11.2–14.5)
WBC: 6.3 10*3/uL (ref 3.9–10.3)
lymph#: 2.2 10*3/uL (ref 0.9–3.3)
nRBC: 0 % (ref 0–0)

## 2012-06-14 LAB — PROTIME-INR
INR: 1.1 — ABNORMAL LOW (ref 2.00–3.50)
Protime: 13.2 Seconds (ref 10.6–13.4)

## 2012-06-14 MED ORDER — AZITHROMYCIN 250 MG PO TABS: 250.0000 mg | ORAL_TABLET | Freq: Every day | ORAL | Status: DC

## 2012-06-14 MED ORDER — PROMETHAZINE-CODEINE 6.25-10 MG/5ML PO SYRP: ORAL_SOLUTION | ORAL | Status: DC

## 2012-06-14 MED ORDER — AZITHROMYCIN 250 MG PO TABS: 500.0000 mg | ORAL_TABLET | Freq: Every day | ORAL | Status: DC

## 2012-06-14 NOTE — Telephone Encounter (Signed)
gve the pt her aug 2013 appt calendar. Pt is aware that we will add the chemo appts

## 2012-06-15 ENCOUNTER — Encounter: Payer: Self-pay | Admitting: Family

## 2012-06-15 ENCOUNTER — Other Ambulatory Visit: Payer: Self-pay | Admitting: *Deleted

## 2012-06-15 NOTE — Progress Notes (Signed)
Hematology and Oncology Follow Up Visit  [name] [id number] Jan 06, 1956 56 y.o. 06/15/2012    HPI: Patient is a 56 year old [place] woman with metastatic HER-2/neu positive left breast carcinoma. She is currently on every 3 week Kadcyla given in the palliative setting, evidence to suggest possible improvement of symptomatology.  2. History of deep vein thrombosis of the right upper extremity with associated significant lymphedema on Coumadin therapy 5 mg daily.  3. Persistent right upper arm lymphedema, active treatment to the lymphedema clinic.  Interim History:   [name] is seen today for INR check. INR was low last week and we asked her to come in today for check. No abnormal bleeding or bruising.  No increased upper extremity edema, has right upper arm lymphedema. Has had symptoms of URI, congestion and wet cough.   Physical Exam: Filed Vitals:   06/14/12 1524  BP: 106/77  Pulse: 86  Temp: 98.6 F (37 C)  Resp: 20  Weight: 188 lbs. HEENT:  Sclerae anicteric, conjunctivae pink.  Oropharynx clear.  No mucositis or candidiasis.   Nodes: The right upper anterior chest wall continues to significantly improve.  No obvious subcutaneous nodules. Skin is thickened, no redness or edema. No palpable supraclavicular, infraclavicular or axillary nodes.  Breast Exam: Bilateral mastectomies. No nodularity or redness of the skin.    Lungs:  Clear to auscultation without wheezing or rhonchi. Heart:  Regular rate and rhythm.   Abdomen:  Soft, nontender.  Positive bowel sounds.  No organomegaly or masses palpated.   Musculoskeletal:  No focal spinal tenderness to palpation.  Extremities: Significant improvement of right arm lymphedema. Compression sleeve in place.  Skin:  Benign.   Neuro:  Nonfocal, alert and oriented x 3.  Lab Results: Lab Results  Component Value Date   WBC 6.3 06/14/2012   HGB 12.5 06/14/2012   HCT 38.0 06/14/2012   MCV 81.5 06/14/2012   PLT 129* 06/14/2012     NEUTROABS 3.2 06/14/2012    Assessment:  Patient is a 56 year old [place] woman with metastatic HER-2/neu positive breast carcinoma, on Kadcyla with good results.  2. History of deep vein thrombosis of the right upper extremity with associated significant lymphedema on Coumadin 7.0, 6.0 and 5.0 mg sequential days with INR pending. INR was low last week, added add'l dose of  Coumadin. Here for recheck.  3. Persistent right upper arm lymphedema, active treatment to the lymphedema clinic, with significant improvement noted. Wears compression sleeve.  Evidence of mild peripheral neuropathy reported. 4. Mild depression.  5. URI  Plan:  1.Increase Coumadin to 10 mg daily in the pm, will recheck in 1 week.  2. Refill prescription for Promethazine/Codeine for cough.  3. Prescription for Z-pack.   This plan was reviewed with the patient, who voices understanding and agreement.  She knows to call with any changes or problems.    Colman Cater, NP-C  06/15/2012

## 2012-06-15 NOTE — Patient Instructions (Signed)
Increase Coumadin to 10 mg daily, take in the pm.  Return for check in 1 week.

## 2012-06-15 NOTE — Telephone Encounter (Signed)
Refill request faxed to wrong provider .  Erroneous encounter

## 2012-06-18 ENCOUNTER — Other Ambulatory Visit: Payer: Self-pay | Admitting: Emergency Medicine

## 2012-06-18 MED ORDER — HYDROCHLOROTHIAZIDE 25 MG PO TABS: 25.0000 mg | ORAL_TABLET | ORAL | Status: DC

## 2012-06-21 ENCOUNTER — Other Ambulatory Visit (HOSPITAL_BASED_OUTPATIENT_CLINIC_OR_DEPARTMENT_OTHER): Payer: Medicare Other

## 2012-06-21 ENCOUNTER — Other Ambulatory Visit: Payer: Medicare Other | Admitting: Lab

## 2012-06-21 DIAGNOSIS — C50219 Malignant neoplasm of upper-inner quadrant of unspecified female breast: Secondary | ICD-10-CM

## 2012-06-21 DIAGNOSIS — Z86718 Personal history of other venous thrombosis and embolism: Secondary | ICD-10-CM

## 2012-06-21 LAB — CBC WITH DIFFERENTIAL/PLATELET
BASO%: 0.7 % (ref 0.0–2.0)
Basophils Absolute: 0.1 10*3/uL (ref 0.0–0.1)
EOS%: 1.8 % (ref 0.0–7.0)
Eosinophils Absolute: 0.2 10*3/uL (ref 0.0–0.5)
HCT: 39.1 % (ref 34.8–46.6)
HGB: 13 g/dL (ref 11.6–15.9)
LYMPH%: 35.1 % (ref 14.0–49.7)
MCH: 27.1 pg (ref 25.1–34.0)
MCHC: 33.2 g/dL (ref 31.5–36.0)
MCV: 81.5 fL (ref 79.5–101.0)
MONO#: 0.7 10*3/uL (ref 0.1–0.9)
MONO%: 8.5 % (ref 0.0–14.0)
NEUT#: 4.4 10*3/uL (ref 1.5–6.5)
NEUT%: 53.9 % (ref 38.4–76.8)
Platelets: 194 10*3/uL (ref 145–400)
RBC: 4.8 10*6/uL (ref 3.70–5.45)
RDW: 18.1 % — ABNORMAL HIGH (ref 11.2–14.5)
WBC: 8.2 10*3/uL (ref 3.9–10.3)
lymph#: 2.9 10*3/uL (ref 0.9–3.3)
nRBC: 0 % (ref 0–0)

## 2012-06-28 ENCOUNTER — Other Ambulatory Visit (HOSPITAL_BASED_OUTPATIENT_CLINIC_OR_DEPARTMENT_OTHER): Payer: Medicare Other | Admitting: Lab

## 2012-06-28 ENCOUNTER — Ambulatory Visit (HOSPITAL_BASED_OUTPATIENT_CLINIC_OR_DEPARTMENT_OTHER): Payer: Medicare Other

## 2012-06-28 ENCOUNTER — Ambulatory Visit (HOSPITAL_BASED_OUTPATIENT_CLINIC_OR_DEPARTMENT_OTHER): Payer: Medicare Other | Admitting: Family

## 2012-06-28 ENCOUNTER — Encounter: Payer: Self-pay | Admitting: Family

## 2012-06-28 VITALS — BP 105/74 | HR 80 | Temp 98.3°F | Resp 20 | Ht 64.5 in | Wt 180.5 lb

## 2012-06-28 DIAGNOSIS — Z5181 Encounter for therapeutic drug level monitoring: Secondary | ICD-10-CM

## 2012-06-28 DIAGNOSIS — C50919 Malignant neoplasm of unspecified site of unspecified female breast: Secondary | ICD-10-CM

## 2012-06-28 DIAGNOSIS — I82409 Acute embolism and thrombosis of unspecified deep veins of unspecified lower extremity: Secondary | ICD-10-CM

## 2012-06-28 DIAGNOSIS — I890 Lymphedema, not elsewhere classified: Secondary | ICD-10-CM

## 2012-06-28 DIAGNOSIS — Z86718 Personal history of other venous thrombosis and embolism: Secondary | ICD-10-CM

## 2012-06-28 DIAGNOSIS — Z5112 Encounter for antineoplastic immunotherapy: Secondary | ICD-10-CM

## 2012-06-28 DIAGNOSIS — C50219 Malignant neoplasm of upper-inner quadrant of unspecified female breast: Secondary | ICD-10-CM

## 2012-06-28 DIAGNOSIS — Z7901 Long term (current) use of anticoagulants: Secondary | ICD-10-CM

## 2012-06-28 DIAGNOSIS — C770 Secondary and unspecified malignant neoplasm of lymph nodes of head, face and neck: Secondary | ICD-10-CM

## 2012-06-28 LAB — CBC WITH DIFFERENTIAL/PLATELET
BASO%: 0.8 % (ref 0.0–2.0)
Basophils Absolute: 0.1 10*3/uL (ref 0.0–0.1)
EOS%: 3.9 % (ref 0.0–7.0)
Eosinophils Absolute: 0.3 10*3/uL (ref 0.0–0.5)
HCT: 40.2 % (ref 34.8–46.6)
HGB: 13.3 g/dL (ref 11.6–15.9)
LYMPH%: 38.8 % (ref 14.0–49.7)
MCH: 26.8 pg (ref 25.1–34.0)
MCHC: 33.1 g/dL (ref 31.5–36.0)
MCV: 81 fL (ref 79.5–101.0)
MONO#: 0.8 10*3/uL (ref 0.1–0.9)
MONO%: 9.3 % (ref 0.0–14.0)
NEUT#: 3.9 10*3/uL (ref 1.5–6.5)
NEUT%: 47.2 % (ref 38.4–76.8)
Platelets: 298 10*3/uL (ref 145–400)
RBC: 4.96 10*6/uL (ref 3.70–5.45)
RDW: 18.3 % — ABNORMAL HIGH (ref 11.2–14.5)
WBC: 8.2 10*3/uL (ref 3.9–10.3)
lymph#: 3.2 10*3/uL (ref 0.9–3.3)
nRBC: 0 % (ref 0–0)

## 2012-06-28 LAB — PROTIME-INR
INR: 1.3 — ABNORMAL LOW (ref 2.00–3.50)
Protime: 15.6 Seconds — ABNORMAL HIGH (ref 10.6–13.4)

## 2012-06-28 LAB — COMPREHENSIVE METABOLIC PANEL
ALT: 14 U/L (ref 0–35)
AST: 39 U/L — ABNORMAL HIGH (ref 0–37)
Albumin: 3.9 g/dL (ref 3.5–5.2)
Alkaline Phosphatase: 84 U/L (ref 39–117)
BUN: 8 mg/dL (ref 6–23)
CO2: 31 mEq/L (ref 19–32)
Calcium: 9.5 mg/dL (ref 8.4–10.5)
Chloride: 99 mEq/L (ref 96–112)
Creatinine, Ser: 0.74 mg/dL (ref 0.50–1.10)
Glucose, Bld: 92 mg/dL (ref 70–99)
Potassium: 3 mEq/L — ABNORMAL LOW (ref 3.5–5.3)
Sodium: 138 mEq/L (ref 135–145)
Total Bilirubin: 0.3 mg/dL (ref 0.3–1.2)
Total Protein: 7.6 g/dL (ref 6.0–8.3)

## 2012-06-28 MED ORDER — ACETAMINOPHEN 325 MG PO TABS
650.0000 mg | ORAL_TABLET | Freq: Once | ORAL | Status: AC
  Administered 2012-06-28: 650 mg via ORAL

## 2012-06-28 MED ORDER — ADO-TRASTUZUMAB EMTANSINE CHEMO INJECTION 160 MG
3.7000 mg/kg | Freq: Once | INTRAVENOUS | Status: AC
  Administered 2012-06-28: 320 mg via INTRAVENOUS
  Filled 2012-06-28: qty 16

## 2012-06-28 MED ORDER — DIPHENHYDRAMINE HCL 25 MG PO CAPS
50.0000 mg | ORAL_CAPSULE | Freq: Once | ORAL | Status: AC
  Administered 2012-06-28: 25 mg via ORAL

## 2012-06-28 MED ORDER — SODIUM CHLORIDE 0.9 % IJ SOLN
10.0000 mL | INTRAMUSCULAR | Status: DC | PRN
  Administered 2012-06-28: 10 mL
  Filled 2012-06-28: qty 10

## 2012-06-28 MED ORDER — SODIUM CHLORIDE 0.9 % IV SOLN
Freq: Once | INTRAVENOUS | Status: AC
  Administered 2012-06-28: 11:00:00 via INTRAVENOUS

## 2012-06-28 MED ORDER — HEPARIN SOD (PORK) LOCK FLUSH 100 UNIT/ML IV SOLN
500.0000 [IU] | Freq: Once | INTRAVENOUS | Status: AC | PRN
  Administered 2012-06-28: 500 [IU]
  Filled 2012-06-28: qty 5

## 2012-06-28 NOTE — Patient Instructions (Addendum)
[facility] Discharge Instructions for Patients Receiving Chemotherapy  Today you received the following chemotherapy agent Kadcyla.  To help prevent nausea and vomiting after your treatment, we encourage you to take your nausea medication. Begin taking your nausea medication as often as prescribed for by your Doctor.    If you develop nausea and vomiting that is not controlled by your nausea medication, call the clinic. If it is after clinic hours your family physician or the after hours number for the clinic or go to the Emergency Department.   BELOW ARE SYMPTOMS THAT SHOULD BE REPORTED IMMEDIATELY:  *FEVER GREATER THAN 100.5 F  *CHILLS WITH OR WITHOUT FEVER  NAUSEA AND VOMITING THAT IS NOT CONTROLLED WITH YOUR NAUSEA MEDICATION  *UNUSUAL SHORTNESS OF BREATH  *UNUSUAL BRUISING OR BLEEDING  TENDERNESS IN MOUTH AND THROAT WITH OR WITHOUT PRESENCE OF ULCERS  *URINARY PROBLEMS  *BOWEL PROBLEMS  UNUSUAL RASH Items with * indicate a potential emergency and should be followed up as soon as possible.  One of the nurses will contact you 24 hours after your treatment. Please let the nurse know about any problems that you may have experienced. Feel free to call the clinic you have any questions or concerns. The clinic phone number is [phone].   I have been informed and understand all the instructions given to me. I know to contact the clinic, my physician, or go to the Emergency Department if any problems should occur. I do not have any questions at this time, but understand that I may call the clinic during office hours   should I have any questions or need assistance in obtaining follow up care.    __________________________________________  _____________  __________ Signature of Patient or Authorized Representative            Date                   Time    __________________________________________ Nurse's Signature

## 2012-06-28 NOTE — Patient Instructions (Signed)
Take Coumadin 7 mg daily in the pm.  Return in 1 week for lab and appt with me.

## 2012-06-28 NOTE — Progress Notes (Signed)
Hematology and Oncology Follow Up Visit  [name] [id number] 1956/01/31 56 y.o. 06/28/2012    HPI: Patient is a 56 year old [place] woman with metastatic HER-2/neu positive left breast carcinoma. She is currently on every 3 week Kadcyla given in the palliative setting, evidence to suggest possible improvement of symptomatology.  2. History of deep vein thrombosis of the right upper extremity with associated significant lymphedema on Coumadin therapy 5 mg daily.  3. Persistent right upper arm lymphedema, active treatment to the lymphedema clinic.  Interim History:   [name] is seen today for INR check and chemo. INR has been difficult to get into therapeutic range. Pt seems to confuse dosing schedule, even when provided in written form. She tells me she is alternating 5 mg, 6 mg and 7 mg nightly. No abnormal bleeding or bruising. No increased upper extremity edema, has right upper arm lymphedema. Previous symptoms of URI, congestion and wet cough, have improved with Z-pack.   She is concerned over weight loss, says she wants to change chemo to every 4 weeks, instead of every 3 weeks. Her speech is erratic, has difficulty focusing on one subject. Is agitated, keeps jumping up, waving her arms and looking at herself in the mirror. States she does not want to be a "[place] pig". When I ask her to clarify, she is unable to articulate her feelings.   No headache or blurred vision. No cough or shortness of breath. No abdominal pain or new bone pain. Bowel and bladder function are normal. Appetite is fair with adequate fluid intake. Remainder of the 10 point  review of systems is negative.   Physical Exam: Filed Vitals:   06/28/12 0937  BP: 105/74  Pulse: 80  Temp: 98.3 F (36.8 C)  Resp: 20  Weight: 188 lbs. HEENT:  Sclerae anicteric, conjunctivae pink.  Oropharynx clear.  No mucositis or candidiasis.   Nodes: The right upper anterior chest wall continues to significantly  improve.  No obvious subcutaneous nodules. Skin is thickened, no redness or edema. No palpable supraclavicular, infraclavicular or axillary nodes.  Breast Exam: Bilateral mastectomies. No nodularity or redness of the skin.    Lungs:  Clear to auscultation without wheezing or rhonchi. Heart:  Regular rate and rhythm.   Abdomen:  Soft, nontender.  Positive bowel sounds.  No organomegaly or masses palpated.   Musculoskeletal:  No focal spinal tenderness to palpation.  Extremities: Significant improvement of right arm lymphedema. Compression sleeve in place.  Skin:  Benign.   Neuro:  Nonfocal, alert and oriented x 3. Breast: bilateral mastectomy with induration of bilateral incision scars.   Lab Results: Lab Results  Component Value Date   WBC 8.2 06/28/2012   HGB 13.3 06/28/2012   HCT 40.2 06/28/2012   MCV 81.0 06/28/2012   PLT 298 06/28/2012   NEUTROABS 3.9 06/28/2012    Assessment:  Patient is a 56 year old [place] woman with metastatic HER-2/neu positive breast carcinoma, on Kadcyla with good results.  2. History of deep vein thrombosis of the right upper extremity with associated significant lymphedema on Coumadin 7.0, 6.0 and 5.0 mg sequential days with INR pending. INR was low last week, added add'l dose of  Coumadin and asked her to increase dose to 10 mg. She continued on rotating dosed as outlined above.  Here for recheck and chemo. .  3. Persistent right upper arm lymphedema, received treatment at the lymphedema clinic, with significant improvement noted. Wears compression sleeve.  Evidence of mild peripheral neuropathy reported.  4. Mild depression.  5. URI improved with z-pack.  5. Agitation, expressing displeasure with current treatment plan.   Plan:  1.Increase Coumadin to 7 mg daily in the pm, will recheck in 1 week.  2.Treatment today.  3. Return in one week for nadir and INR check.   Plan was discussed with Dr. Donnie Coffin. Per her instructions I offer to extend  treatment schedule to every 4 weeks, but explain that every three weeks is the dosing schedule recommended for this drug. She relents and agrees to treatment today.  I give written instructions about Coumadin dosing.   This plan was reviewed with the patient, who voices understanding and agreement.  She knows to call with any changes or problems.    Colman Cater, NP-C  06/28/2012

## 2012-07-04 ENCOUNTER — Encounter: Payer: Self-pay | Admitting: Family

## 2012-07-04 ENCOUNTER — Ambulatory Visit: Payer: Medicare Other

## 2012-07-04 ENCOUNTER — Telehealth: Payer: Self-pay | Admitting: *Deleted

## 2012-07-04 ENCOUNTER — Ambulatory Visit (HOSPITAL_BASED_OUTPATIENT_CLINIC_OR_DEPARTMENT_OTHER): Payer: Medicare Other | Admitting: Family

## 2012-07-04 ENCOUNTER — Other Ambulatory Visit (HOSPITAL_BASED_OUTPATIENT_CLINIC_OR_DEPARTMENT_OTHER): Payer: Medicare Other | Admitting: Lab

## 2012-07-04 VITALS — BP 128/89 | HR 89 | Temp 98.5°F | Resp 20 | Ht 64.5 in | Wt 176.9 lb

## 2012-07-04 DIAGNOSIS — Z86718 Personal history of other venous thrombosis and embolism: Secondary | ICD-10-CM

## 2012-07-04 DIAGNOSIS — Z7901 Long term (current) use of anticoagulants: Secondary | ICD-10-CM

## 2012-07-04 DIAGNOSIS — C50919 Malignant neoplasm of unspecified site of unspecified female breast: Secondary | ICD-10-CM

## 2012-07-04 DIAGNOSIS — I890 Lymphedema, not elsewhere classified: Secondary | ICD-10-CM

## 2012-07-04 DIAGNOSIS — C778 Secondary and unspecified malignant neoplasm of lymph nodes of multiple regions: Secondary | ICD-10-CM

## 2012-07-04 DIAGNOSIS — Z5181 Encounter for therapeutic drug level monitoring: Secondary | ICD-10-CM

## 2012-07-04 LAB — CBC WITH DIFFERENTIAL/PLATELET
BASO%: 0.7 % (ref 0.0–2.0)
Basophils Absolute: 0 10*3/uL (ref 0.0–0.1)
EOS%: 2.5 % (ref 0.0–7.0)
Eosinophils Absolute: 0.2 10*3/uL (ref 0.0–0.5)
HCT: 39.3 % (ref 34.8–46.6)
HGB: 13 g/dL (ref 11.6–15.9)
LYMPH%: 22.8 % (ref 14.0–49.7)
MCH: 26.6 pg (ref 25.1–34.0)
MCHC: 33.1 g/dL (ref 31.5–36.0)
MCV: 80.4 fL (ref 79.5–101.0)
MONO#: 0.5 10*3/uL (ref 0.1–0.9)
MONO%: 8.5 % (ref 0.0–14.0)
NEUT#: 3.9 10*3/uL (ref 1.5–6.5)
NEUT%: 65.5 % (ref 38.4–76.8)
Platelets: 148 10*3/uL (ref 145–400)
RBC: 4.89 10*6/uL (ref 3.70–5.45)
RDW: 18.1 % — ABNORMAL HIGH (ref 11.2–14.5)
WBC: 5.9 10*3/uL (ref 3.9–10.3)
lymph#: 1.3 10*3/uL (ref 0.9–3.3)
nRBC: 0 % (ref 0–0)

## 2012-07-04 LAB — PROTIME-INR
INR: 1.1 — ABNORMAL LOW (ref 2.00–3.50)
Protime: 13.2 Seconds (ref 10.6–13.4)

## 2012-07-04 MED ORDER — PROMETHAZINE HCL 25 MG PO TABS: 12.5000 mg | ORAL_TABLET | Freq: Four times a day (QID) | ORAL | Status: DC | PRN

## 2012-07-04 MED ORDER — OXYCODONE HCL 5 MG PO TABS: 5.0000 mg | ORAL_TABLET | ORAL | Status: AC | PRN

## 2012-07-04 NOTE — Patient Instructions (Addendum)
Take 2 of the 5 mg tablets daily to total 10 mg.  Return in 1 week to see Dr. Donnie Coffin

## 2012-07-04 NOTE — Progress Notes (Signed)
Hematology and Oncology Follow Up Visit  [name] [id number] 1956-03-28 56 y.o. 07/04/2012    HPI: Patient is a 56 year old [place] woman with metastatic HER-2/neu positive left breast carcinoma. She is currently on every 3 week Kadcyla given in the palliative setting, evidence to suggest possible improvement of symptomatology.  2. History of deep vein thrombosis of the right upper extremity with associated significant lymphedema on Coumadin therapy 5 mg daily.  3. Persistent right upper arm lymphedema, active treatment to the lymphedema clinic.  Interim History:   Laykin is seen today for INR and nadir check, last chemo 8/22. She is tearful, asking to speak with Dr. Donnie Coffin. She is upset that she has not seen Dr. Donnie Coffin for the last few visits. INR has been difficult to get into therapeutic range. Several dosage adjustments have been made without success. Pt seems to confused regarding dosing schedule, even when provided in written form. She tells me she is taking 7.5 mg, then admits to taking 10 mg. No abnormal bleeding, has noticed several new bruises.  No increased upper extremity edema, has right upper arm lymphedema, wears a compression sleeve. Previous symptoms of URI, congestion and wet cough, have completely resolved.    She is concerned over weight loss, says she wants a break from chemo. Her speech is erratic, has difficulty focusing on one subject. No headache or blurred vision. No cough or shortness of breath. No abdominal pain or new bone pain. Bowel and bladder function are normal. Appetite is fair with adequate fluid intake. Remainder of the 10 point  review of systems is negative.   Physical Exam: Filed Vitals:   07/04/12 1148  BP: 128/89  Pulse: 89  Temp: 98.5 F (36.9 C)  Resp: 20  Weight: 188 lbs. HEENT:  Sclerae anicteric, conjunctivae pink.  Oropharynx clear.  No mucositis or candidiasis.   Nodes: The right upper anterior chest wall continues to  significantly improve.  No obvious subcutaneous nodules. Skin is thickened, no redness or edema. No palpable supraclavicular, infraclavicular or axillary nodes.  Lungs:  Clear to auscultation without wheezing or rhonchi. Heart:  Regular rate and rhythm.   Abdomen:  Soft, nontender.  Positive bowel sounds.  No organomegaly or masses palpated.   Musculoskeletal:  No focal spinal tenderness to palpation.  Extremities: Significant improvement of right arm lymphedema. Compression sleeve in place.  Skin:  Benign.   Neuro:  Nonfocal, alert and oriented x 3. Breast: bilateral mastectomy with induration of bilateral incision scars.   Lab Results: Lab Results  Component Value Date   WBC 5.9 07/04/2012   HGB 13.0 07/04/2012   HCT 39.3 07/04/2012   MCV 80.4 07/04/2012   PLT 148 07/04/2012   NEUTROABS 3.9 07/04/2012    Assessment:  Patient is a 56 year old [place] woman with metastatic HER-2/neu positive breast carcinoma, on Kadcyla with good results.  2. History of deep vein thrombosis of the right upper extremity with associated significant lymphedema on Coumadin 7.0, 6.0 and 5.0 mg sequential days with INR pending. INR was low last week, added add'l dose of  Coumadin and asked her to increase dose to 10 mg. She continued on rotating dosed as outlined above.  Here for recheck and chemo. .  3. Persistent right upper arm lymphedema, received treatment at the lymphedema clinic, with significant improvement noted. Wears compression sleeve.  Evidence of mild peripheral neuropathy reported. 4. Mild depression.  5. URI improved with z-pack, symptoms resolved.  5. Agitation, expressing displeasure with current  treatment plan and asking to see Dr. Donnie Coffin.   Plan:  1. Increase Coumadin to 10 mg daily in the pm, will recheck in 1 week.  2. No treatment today.  3. Return in one week to see Dr. Donnie Coffin with INR check.  Plan was discussed with Dr. Donnie Coffin and he counseled the patient. He agreed to  modify the treatment plan to give her some time off. I give written instructions about Coumadin dosing.   This plan was reviewed with the patient, who voices understanding and agreement.  She knows to call with any changes or problems.    Colman Cater, NP-C  07/04/2012

## 2012-07-04 NOTE — Telephone Encounter (Signed)
Gave patient appointment for 07-10-2012 starting at 12:30pm with labs

## 2012-07-05 ENCOUNTER — Ambulatory Visit: Payer: Medicare Other

## 2012-07-05 ENCOUNTER — Ambulatory Visit: Payer: Medicare Other | Admitting: Family

## 2012-07-05 ENCOUNTER — Other Ambulatory Visit: Payer: Medicare Other | Admitting: Lab

## 2012-07-10 ENCOUNTER — Other Ambulatory Visit: Payer: Self-pay | Admitting: Oncology

## 2012-07-10 ENCOUNTER — Other Ambulatory Visit (HOSPITAL_BASED_OUTPATIENT_CLINIC_OR_DEPARTMENT_OTHER): Payer: Medicare Other | Admitting: Lab

## 2012-07-10 ENCOUNTER — Other Ambulatory Visit: Payer: Self-pay | Admitting: *Deleted

## 2012-07-10 ENCOUNTER — Ambulatory Visit (HOSPITAL_BASED_OUTPATIENT_CLINIC_OR_DEPARTMENT_OTHER): Payer: Medicare Other | Admitting: Oncology

## 2012-07-10 ENCOUNTER — Telehealth: Payer: Self-pay | Admitting: *Deleted

## 2012-07-10 VITALS — BP 132/88 | HR 102 | Temp 98.8°F | Resp 20 | Ht 64.5 in | Wt 178.0 lb

## 2012-07-10 DIAGNOSIS — Z5181 Encounter for therapeutic drug level monitoring: Secondary | ICD-10-CM

## 2012-07-10 DIAGNOSIS — Z86718 Personal history of other venous thrombosis and embolism: Secondary | ICD-10-CM

## 2012-07-10 DIAGNOSIS — C50919 Malignant neoplasm of unspecified site of unspecified female breast: Secondary | ICD-10-CM

## 2012-07-10 DIAGNOSIS — F329 Major depressive disorder, single episode, unspecified: Secondary | ICD-10-CM

## 2012-07-10 DIAGNOSIS — I82409 Acute embolism and thrombosis of unspecified deep veins of unspecified lower extremity: Secondary | ICD-10-CM

## 2012-07-10 DIAGNOSIS — I890 Lymphedema, not elsewhere classified: Secondary | ICD-10-CM

## 2012-07-10 DIAGNOSIS — C50419 Malignant neoplasm of upper-outer quadrant of unspecified female breast: Secondary | ICD-10-CM

## 2012-07-10 LAB — PROTIME-INR
INR: 2.6 (ref 2.00–3.50)
Protime: 31.2 Seconds — ABNORMAL HIGH (ref 10.6–13.4)

## 2012-07-10 NOTE — Progress Notes (Signed)
Hematology and Oncology Follow Up Visit  [name] [id number] 05-Aug-1956 56 y.o. 07/10/2012    HPI: Patient is a 56 year old [place] woman with metastatic HER-2/neu positive left breast carcinoma. She is currently on every 3 week Kadcyla given in the palliative setting, evidence to suggest possible improvement of symptomatology.  2. History of deep vein thrombosis of the right upper extremity with associated significant lymphedema on Coumadin therapy 5 mg daily.  3. Persistent right upper arm lymphedema, active treatment to the lymphedema clinic.  Interim History:   Patient returns for followup. Overall doing pretty well. We reviewed her compliance with Coumadin apparently is this is improved and are as improved as well. She has no other complaints. She notes that her pain control is fairly good. Her affect and general mood have  improved as well. She is due for repeat treatment on the 12th of this month.  [name] of the 10 point  review of systems is negative.   Physical Exam: Filed Vitals:   07/10/12 1309  BP: 132/88  Pulse: 102  Temp: 98.8 F (37.1 C)  Resp: 20  Weight: 188 lbs. HEENT:  Sclerae anicteric, conjunctivae pink.  Oropharynx clear.  No mucositis or candidiasis.   Nodes: The right upper anterior chest wall continues to significantly improve.  No obvious subcutaneous nodules. Skin is thickened, no redness or edema. No palpable supraclavicular, infraclavicular or axillary nodes.  Lungs:  Clear to auscultation without wheezing or rhonchi. Heart:  Regular rate and rhythm.   Abdomen:  Soft, nontender.  Positive bowel sounds.  No organomegaly or masses palpated.   Musculoskeletal:  No focal spinal tenderness to palpation.  Extremities: Significant improvement of right arm lymphedema. Compression sleeve in place.  Skin:  Benign.   Neuro:  Nonfocal, alert and oriented x 3. Breast: bilateral mastectomy with induration of bilateral incision scars. Port  site looks normal. No obvious appreciable palpable adenopathy on today's exam.  Lab Results: Lab Results  Component Value Date   WBC 5.9 07/04/2012   HGB 13.0 07/04/2012   HCT 39.3 07/04/2012   MCV 80.4 07/04/2012   PLT 148 07/04/2012   NEUTROABS 3.9 07/04/2012    Assessment:  Patient is a 56 year old [place] woman with metastatic HER-2/neu positive breast carcinoma, on Kadcyla with good results.  2. History of deep vein thrombosis of the right upper extremity with associated significant lymphedema on Coumadin 7.0, 6.0 and 5.0 mg sequential days with INR therapeutic today. 3. Persistent right upper arm lymphedema, received treatment at the lymphedema clinic, with significant improvement noted. Wears compression sleeve.  Evidence of mild peripheral neuropathy reported. 4. Mild depression.  5. URI improved with z-pack, symptoms resolved.  5. Agitation, expressing displeasure with current treatment plan and asking to see Dr. Donnie Coffin.   Plan:  We'll continue to monitor her lab work and ON or. She will return tomorrow for followup treatment. Overall clinical response is been excellent. I am gratified by well she is doing.  This plan was reviewed with the patient, who voices understanding and agreement.  She knows to call with any changes or problems.    [name] Minichiello,   md 07/10/2012

## 2012-07-10 NOTE — Telephone Encounter (Signed)
Per staff message and POf I have scheduled appts.  JMW  

## 2012-07-18 ENCOUNTER — Encounter: Payer: Self-pay | Admitting: *Deleted

## 2012-07-18 ENCOUNTER — Telehealth: Payer: Self-pay | Admitting: *Deleted

## 2012-07-18 NOTE — Telephone Encounter (Signed)
Per orders from 07-18-2012  Placed patient on the md calendar for 08-08-2012 starting at 4:00pm

## 2012-07-19 ENCOUNTER — Ambulatory Visit: Payer: Medicare Other

## 2012-08-08 ENCOUNTER — Other Ambulatory Visit (HOSPITAL_BASED_OUTPATIENT_CLINIC_OR_DEPARTMENT_OTHER): Payer: Medicare Other | Admitting: Lab

## 2012-08-08 ENCOUNTER — Other Ambulatory Visit: Payer: Self-pay | Admitting: *Deleted

## 2012-08-08 ENCOUNTER — Ambulatory Visit (HOSPITAL_BASED_OUTPATIENT_CLINIC_OR_DEPARTMENT_OTHER): Payer: Medicare Other | Admitting: Oncology

## 2012-08-08 VITALS — BP 99/73 | HR 96 | Temp 98.9°F | Resp 20 | Ht 64.5 in | Wt 169.7 lb

## 2012-08-08 DIAGNOSIS — Z5181 Encounter for therapeutic drug level monitoring: Secondary | ICD-10-CM

## 2012-08-08 DIAGNOSIS — C50919 Malignant neoplasm of unspecified site of unspecified female breast: Secondary | ICD-10-CM

## 2012-08-08 DIAGNOSIS — I82409 Acute embolism and thrombosis of unspecified deep veins of unspecified lower extremity: Secondary | ICD-10-CM

## 2012-08-08 DIAGNOSIS — F329 Major depressive disorder, single episode, unspecified: Secondary | ICD-10-CM

## 2012-08-08 DIAGNOSIS — I890 Lymphedema, not elsewhere classified: Secondary | ICD-10-CM

## 2012-08-08 DIAGNOSIS — Z86718 Personal history of other venous thrombosis and embolism: Secondary | ICD-10-CM

## 2012-08-08 LAB — CBC WITH DIFFERENTIAL/PLATELET
BASO%: 0.5 % (ref 0.0–2.0)
Basophils Absolute: 0.1 10*3/uL (ref 0.0–0.1)
EOS%: 1.3 % (ref 0.0–7.0)
Eosinophils Absolute: 0.1 10*3/uL (ref 0.0–0.5)
HCT: 42.8 % (ref 34.8–46.6)
HGB: 14.3 g/dL (ref 11.6–15.9)
LYMPH%: 43.3 % (ref 14.0–49.7)
MCH: 27.7 pg (ref 25.1–34.0)
MCHC: 33.4 g/dL (ref 31.5–36.0)
MCV: 82.8 fL (ref 79.5–101.0)
MONO#: 0.6 10*3/uL (ref 0.1–0.9)
MONO%: 6.9 % (ref 0.0–14.0)
NEUT#: 4.4 10*3/uL (ref 1.5–6.5)
NEUT%: 48 % (ref 38.4–76.8)
Platelets: 221 10*3/uL (ref 145–400)
RBC: 5.17 10*6/uL (ref 3.70–5.45)
RDW: 17.8 % — ABNORMAL HIGH (ref 11.2–14.5)
WBC: 9.2 10*3/uL (ref 3.9–10.3)
lymph#: 4 10*3/uL — ABNORMAL HIGH (ref 0.9–3.3)
nRBC: 0 % (ref 0–0)

## 2012-08-08 LAB — PROTIME-INR
INR: 1.8 — ABNORMAL LOW (ref 2.00–3.50)
Protime: 21.6 Seconds — ABNORMAL HIGH (ref 10.6–13.4)

## 2012-08-08 MED ORDER — WARFARIN SODIUM 5 MG PO TABS: 10.0000 mg | ORAL_TABLET | Freq: Every day | ORAL | Status: DC

## 2012-08-09 ENCOUNTER — Telehealth: Payer: Self-pay | Admitting: *Deleted

## 2012-08-09 ENCOUNTER — Other Ambulatory Visit: Payer: Self-pay | Admitting: Family

## 2012-08-09 ENCOUNTER — Ambulatory Visit (HOSPITAL_BASED_OUTPATIENT_CLINIC_OR_DEPARTMENT_OTHER): Payer: Medicare Other

## 2012-08-09 VITALS — BP 128/83 | HR 83 | Temp 97.1°F | Resp 20

## 2012-08-09 DIAGNOSIS — Z5112 Encounter for antineoplastic immunotherapy: Secondary | ICD-10-CM

## 2012-08-09 DIAGNOSIS — C50919 Malignant neoplasm of unspecified site of unspecified female breast: Secondary | ICD-10-CM

## 2012-08-09 DIAGNOSIS — I82629 Acute embolism and thrombosis of deep veins of unspecified upper extremity: Secondary | ICD-10-CM

## 2012-08-09 LAB — COMPREHENSIVE METABOLIC PANEL (CC13)
ALT: 17 U/L (ref 0–55)
AST: 38 U/L — ABNORMAL HIGH (ref 5–34)
Albumin: 3.7 g/dL (ref 3.5–5.0)
Alkaline Phosphatase: 86 U/L (ref 40–150)
BUN: 16 mg/dL (ref 7.0–26.0)
CO2: 27 mEq/L (ref 22–29)
Calcium: 9.6 mg/dL (ref 8.4–10.4)
Chloride: 103 mEq/L (ref 98–107)
Creatinine: 0.9 mg/dL (ref 0.6–1.1)
Glucose: 102 mg/dl — ABNORMAL HIGH (ref 70–99)
Potassium: 3.5 mEq/L (ref 3.5–5.1)
Sodium: 146 mEq/L — ABNORMAL HIGH (ref 136–145)
Total Bilirubin: 0.3 mg/dL (ref 0.20–1.20)
Total Protein: 7.7 g/dL (ref 6.4–8.3)

## 2012-08-09 MED ORDER — HEPARIN SOD (PORK) LOCK FLUSH 100 UNIT/ML IV SOLN
500.0000 [IU] | Freq: Once | INTRAVENOUS | Status: AC | PRN
  Administered 2012-08-09: 500 [IU]
  Filled 2012-08-09: qty 5

## 2012-08-09 MED ORDER — ACETAMINOPHEN 325 MG PO TABS
650.0000 mg | ORAL_TABLET | Freq: Once | ORAL | Status: AC
  Administered 2012-08-09: 650 mg via ORAL

## 2012-08-09 MED ORDER — SODIUM CHLORIDE 0.9 % IV SOLN
Freq: Once | INTRAVENOUS | Status: AC
  Administered 2012-08-09: 10:00:00 via INTRAVENOUS

## 2012-08-09 MED ORDER — DIPHENHYDRAMINE HCL 25 MG PO CAPS
50.0000 mg | ORAL_CAPSULE | Freq: Once | ORAL | Status: AC
  Administered 2012-08-09: 50 mg via ORAL

## 2012-08-09 MED ORDER — SODIUM CHLORIDE 0.9 % IJ SOLN
10.0000 mL | INTRAMUSCULAR | Status: DC | PRN
  Administered 2012-08-09: 10 mL
  Filled 2012-08-09: qty 10

## 2012-08-09 MED ORDER — SODIUM CHLORIDE 0.9 % IV SOLN
3.7000 mg/kg | Freq: Once | INTRAVENOUS | Status: AC
  Administered 2012-08-09: 320 mg via INTRAVENOUS
  Filled 2012-08-09: qty 16

## 2012-08-09 NOTE — Progress Notes (Signed)
Patient refused to stay 30-minutes post- Kadcyla infusion.  Patient advised against it.

## 2012-08-09 NOTE — Telephone Encounter (Signed)
Per staff message and POF I have scheduled appt.  JMW  

## 2012-08-09 NOTE — Telephone Encounter (Signed)
Gave patient appointment for 09-14-2012 starting at 2:30pm  [facility] email to set up patient's treatment

## 2012-08-09 NOTE — Patient Instructions (Signed)
[facility] Discharge Instructions for Patients Receiving Chemotherapy  Today you received the following chemotherapy agents Kadcyla To help prevent nausea and vomiting after your treatment, we encourage you to take your nausea medication as prescribed.  If you develop nausea and vomiting that is not controlled by your nausea medication, call the clinic. If it is after clinic hours your family physician or the after hours number for the clinic or go to the Emergency Department.   BELOW ARE SYMPTOMS THAT SHOULD BE REPORTED IMMEDIATELY:  *FEVER GREATER THAN 100.5 F  *CHILLS WITH OR WITHOUT FEVER  NAUSEA AND VOMITING THAT IS NOT CONTROLLED WITH YOUR NAUSEA MEDICATION  *UNUSUAL SHORTNESS OF BREATH  *UNUSUAL BRUISING OR BLEEDING  TENDERNESS IN MOUTH AND THROAT WITH OR WITHOUT PRESENCE OF ULCERS  *URINARY PROBLEMS  *BOWEL PROBLEMS  UNUSUAL RASH Items with * indicate a potential emergency and should be followed up as soon as possible.  One of the nurses will contact you 24 hours after your treatment. Please let the nurse know about any problems that you may have experienced. Feel free to call the clinic you have any questions or concerns. The clinic phone number is [phone].   I have been informed and understand all the instructions given to me. I know to contact the clinic, my physician, or go to the Emergency Department if any problems should occur. I do not have any questions at this time, but understand that I may call the clinic during office hours   should I have any questions or need assistance in obtaining follow up care.    __________________________________________  _____________  __________ Signature of Patient or Authorized Representative            Date                   Time    __________________________________________ Nurse's Signature    

## 2012-08-09 NOTE — Progress Notes (Signed)
Patient refused to stay for the 30 minutes post infusion monitoring.

## 2012-08-10 ENCOUNTER — Telehealth: Payer: Self-pay | Admitting: *Deleted

## 2012-08-10 NOTE — Telephone Encounter (Signed)
left voice message on [phone] to inform the patient of the appointment on 09-14-2012 starting at 2:30 pm

## 2012-08-12 NOTE — Progress Notes (Signed)
Hematology and Oncology Follow Up Visit  [name] [id number] Feb 07, 1956 56 y.o. 08/12/2012    HPI: Patient is a 56 year old [place] woman with metastatic HER-2/neu positive left breast carcinoma. She is currently on every 3 week Kadcyla given in the palliative setting, evidence to suggest possible improvement of symptomatology.  2. History of deep vein thrombosis of the right upper extremity with associated significant lymphedema on Coumadin therapy 5 mg daily.  3. Persistent right upper arm lymphedema, active treatment to the lymphedema clinic.  Interim History:   Patient returns for followup. Overall doing pretty well. We reviewed her compliance with Coumadin apparently is this is improved and are as improved as well. She has no other complaints. She notes that her pain control is fairly good. Her affect and general mood have  improved as well. She is due for repeat treatment later on in the month.   [name] of the 10 point  review of systems is negative.   Physical Exam: Filed Vitals:   08/08/12 1623  BP: 99/73  Pulse: 96  Temp: 98.9 F (37.2 C)  Resp: 20  Weight: 188 lbs. HEENT:  Sclerae anicteric, conjunctivae pink.  Oropharynx clear.  No mucositis or candidiasis.   Nodes: The right upper anterior chest wall continues to significantly improve.  No obvious subcutaneous nodules. Skin is thickened, no redness or edema. No palpable supraclavicular, infraclavicular or axillary nodes.  Lungs:  Clear to auscultation without wheezing or rhonchi. Heart:  Regular rate and rhythm.   Abdomen:  Soft, nontender.  Positive bowel sounds.  No organomegaly or masses palpated.   Musculoskeletal:  No focal spinal tenderness to palpation.  Extremities: Significant improvement of right arm lymphedema. Compression sleeve in place.  Skin:  Benign.   Neuro:  Nonfocal, alert and oriented x 3. Breast: bilateral mastectomy with induration of bilateral incision scars. Port site  looks normal. No obvious appreciable palpable adenopathy on today's exam.  Lab Results: Lab Results  Component Value Date   WBC 9.2 08/08/2012   HGB 14.3 08/08/2012   HCT 42.8 08/08/2012   MCV 82.8 08/08/2012   PLT 221 08/08/2012   NEUTROABS 4.4 08/08/2012    Assessment:  Patient is a 56 year old [place] woman with metastatic HER-2/neu positive breast carcinoma, on Kadcyla with good results.  2. History of deep vein thrombosis of the right upper extremity with associated significant lymphedema on Coumadin 7.0, 6.0 and 5.0 mg sequential days with INR therapeutic today. 3. Persistent right upper arm lymphedema, received treatment at the lymphedema clinic, with significant improvement noted. Wears compression sleeve.  Evidence of mild peripheral neuropathy reported. 4. Mild depression.    Plan:  We'll continue to monitor her lab work and ON or. She will return tomorrow for followup treatment. Overall clinical response is been excellent. I am gratified by well she is doing.  This plan was reviewed with the patient, who voices understanding and agreement.  She knows to call with any changes or problems.    Kwane Rohl,   md 08/12/2012

## 2012-08-16 ENCOUNTER — Other Ambulatory Visit: Payer: Self-pay | Admitting: *Deleted

## 2012-08-16 MED ORDER — PROMETHAZINE HCL 25 MG PO TABS: 12.5000 mg | ORAL_TABLET | Freq: Four times a day (QID) | ORAL | Status: DC | PRN

## 2012-09-13 ENCOUNTER — Other Ambulatory Visit: Payer: Self-pay | Admitting: Emergency Medicine

## 2012-09-13 DIAGNOSIS — C50919 Malignant neoplasm of unspecified site of unspecified female breast: Secondary | ICD-10-CM

## 2012-09-13 DIAGNOSIS — I428 Other cardiomyopathies: Secondary | ICD-10-CM

## 2012-09-14 ENCOUNTER — Ambulatory Visit (HOSPITAL_BASED_OUTPATIENT_CLINIC_OR_DEPARTMENT_OTHER): Payer: BC Managed Care – PPO

## 2012-09-14 ENCOUNTER — Telehealth: Payer: Self-pay | Admitting: *Deleted

## 2012-09-14 ENCOUNTER — Other Ambulatory Visit (HOSPITAL_BASED_OUTPATIENT_CLINIC_OR_DEPARTMENT_OTHER): Payer: Medicare Other | Admitting: Lab

## 2012-09-14 ENCOUNTER — Ambulatory Visit (HOSPITAL_BASED_OUTPATIENT_CLINIC_OR_DEPARTMENT_OTHER): Payer: Medicare Other | Admitting: Oncology

## 2012-09-14 VITALS — BP 114/72 | HR 85 | Temp 97.9°F | Resp 20 | Ht 64.5 in | Wt 188.0 lb

## 2012-09-14 DIAGNOSIS — Z5181 Encounter for therapeutic drug level monitoring: Secondary | ICD-10-CM

## 2012-09-14 DIAGNOSIS — C778 Secondary and unspecified malignant neoplasm of lymph nodes of multiple regions: Secondary | ICD-10-CM

## 2012-09-14 DIAGNOSIS — Z86718 Personal history of other venous thrombosis and embolism: Secondary | ICD-10-CM

## 2012-09-14 DIAGNOSIS — C50419 Malignant neoplasm of upper-outer quadrant of unspecified female breast: Secondary | ICD-10-CM

## 2012-09-14 DIAGNOSIS — C50919 Malignant neoplasm of unspecified site of unspecified female breast: Secondary | ICD-10-CM

## 2012-09-14 DIAGNOSIS — I890 Lymphedema, not elsewhere classified: Secondary | ICD-10-CM

## 2012-09-14 DIAGNOSIS — O2230 Deep phlebothrombosis in pregnancy, unspecified trimester: Secondary | ICD-10-CM

## 2012-09-14 DIAGNOSIS — Z7901 Long term (current) use of anticoagulants: Secondary | ICD-10-CM

## 2012-09-14 DIAGNOSIS — Z5112 Encounter for antineoplastic immunotherapy: Secondary | ICD-10-CM

## 2012-09-14 LAB — CBC WITH DIFFERENTIAL/PLATELET
BASO%: 0.3 % (ref 0.0–2.0)
Basophils Absolute: 0 10*3/uL (ref 0.0–0.1)
EOS%: 3 % (ref 0.0–7.0)
Eosinophils Absolute: 0.3 10*3/uL (ref 0.0–0.5)
HCT: 36.2 % (ref 34.8–46.6)
HGB: 11.7 g/dL (ref 11.6–15.9)
LYMPH%: 26.1 % (ref 14.0–49.7)
MCH: 27.5 pg (ref 25.1–34.0)
MCHC: 32.3 g/dL (ref 31.5–36.0)
MCV: 85.2 fL (ref 79.5–101.0)
MONO#: 0.9 10*3/uL (ref 0.1–0.9)
MONO%: 9.8 % (ref 0.0–14.0)
NEUT#: 5.6 10*3/uL (ref 1.5–6.5)
NEUT%: 60.8 % (ref 38.4–76.8)
Platelets: 158 10*3/uL (ref 145–400)
RBC: 4.25 10*6/uL (ref 3.70–5.45)
RDW: 18.1 % — ABNORMAL HIGH (ref 11.2–14.5)
WBC: 9.2 10*3/uL (ref 3.9–10.3)
lymph#: 2.4 10*3/uL (ref 0.9–3.3)

## 2012-09-14 LAB — PROTIME-INR
INR: 3.7 — ABNORMAL HIGH (ref 2.00–3.50)
Protime: 44.4 Seconds — ABNORMAL HIGH (ref 10.6–13.4)

## 2012-09-14 MED ORDER — SODIUM CHLORIDE 0.9 % IV SOLN
3.7000 mg/kg | Freq: Once | INTRAVENOUS | Status: AC
  Administered 2012-09-14: 320 mg via INTRAVENOUS
  Filled 2012-09-14: qty 16

## 2012-09-14 MED ORDER — DIPHENHYDRAMINE HCL 25 MG PO CAPS
50.0000 mg | ORAL_CAPSULE | Freq: Once | ORAL | Status: AC
  Administered 2012-09-14: 50 mg via ORAL

## 2012-09-14 MED ORDER — ACETAMINOPHEN 325 MG PO TABS
650.0000 mg | ORAL_TABLET | Freq: Once | ORAL | Status: AC
  Administered 2012-09-14: 650 mg via ORAL

## 2012-09-14 MED ORDER — SODIUM CHLORIDE 0.9 % IJ SOLN
10.0000 mL | INTRAMUSCULAR | Status: DC | PRN
  Filled 2012-09-14: qty 10

## 2012-09-14 MED ORDER — HEPARIN SOD (PORK) LOCK FLUSH 100 UNIT/ML IV SOLN
500.0000 [IU] | Freq: Once | INTRAVENOUS | Status: DC | PRN
  Filled 2012-09-14: qty 5

## 2012-09-14 MED ORDER — SODIUM CHLORIDE 0.9 % IV SOLN
Freq: Once | INTRAVENOUS | Status: AC
  Administered 2012-09-14: 16:00:00 via INTRAVENOUS

## 2012-09-14 NOTE — Patient Instructions (Signed)
[facility] Discharge Instructions for Patients Receiving Chemotherapy  Today you received the following chemotherapy agents: Kadcyla  To help prevent nausea and vomiting after your treatment, we encourage you to take your nausea medication.  Take it as often as prescribed.     If you develop nausea and vomiting that is not controlled by your nausea medication, call the clinic. If it is after clinic hours your family physician or the after hours number for the clinic or go to the Emergency Department.   BELOW ARE SYMPTOMS THAT SHOULD BE REPORTED IMMEDIATELY:  *FEVER GREATER THAN 100.5 F  *CHILLS WITH OR WITHOUT FEVER  NAUSEA AND VOMITING THAT IS NOT CONTROLLED WITH YOUR NAUSEA MEDICATION  *UNUSUAL SHORTNESS OF BREATH  *UNUSUAL BRUISING OR BLEEDING  TENDERNESS IN MOUTH AND THROAT WITH OR WITHOUT PRESENCE OF ULCERS  *URINARY PROBLEMS  *BOWEL PROBLEMS  UNUSUAL RASH Items with * indicate a potential emergency and should be followed up as soon as possible.  Feel free to call the clinic you have any questions or concerns. The clinic phone number is [phone].   I have been informed and understand all the instructions given to me. I know to contact the clinic, my physician, or go to the Emergency Department if any problems should occur. I do not have any questions at this time, but understand that I may call the clinic during office hours   should I have any questions or need assistance in obtaining follow up care.    __________________________________________  _____________  __________ Signature of Patient or Authorized Representative            Date                   Time    __________________________________________ Nurse's Signature

## 2012-09-14 NOTE — Progress Notes (Signed)
Hematology and Oncology Follow Up Visit  [name] [id number] 12-13-1955 56 y.o. 09/14/2012    HPI: Patient is a 56 year old [place] woman with metastatic HER-2/neu positive left breast carcinoma. She is currently on every 3 week Kadcyla given in the palliative setting, since May 2013, evidence to suggest possible improvement of symptomatology.  2. History of deep vein thrombosis of the right upper extremity with associated significant lymphedema on Coumadin therapy 10 mg a day .  3. Persistent right upper arm lymphedema, active treatment to the lymphedema clinic.  Interim History:   Patient returns for followup. She has no active complaints. She is taking 10 mg Coumadin a day. She has no evidence of bleeding or bruising. Appetite good she is maintaining an adequate diet. She denies headaches blurred vision shortness of breath or cough. Bowels are working well to  . Remainder of the 10 point  review of systems is negative.   Physical Exam: Filed Vitals:   09/14/12 1446  BP: 114/72  Pulse: 85  Temp: 97.9 F (36.6 C)  Resp: 20  Weight: 188 lbs. HEENT:  Sclerae anicteric, conjunctivae pink.  Oropharynx clear.  No mucositis or candidiasis.   Nodes: The right upper anterior chest wall continues to significantly improve.  No obvious subcutaneous nodules. Skin is thickened, no redness or edema. No palpable supraclavicular, infraclavicular or axillary nodes.  Lungs:  Clear to auscultation without wheezing or rhonchi. Heart:  Regular rate and rhythm.   Abdomen:  Soft, nontender.  Positive bowel sounds.  No organomegaly or masses palpated.   Musculoskeletal:  No focal spinal tenderness to palpation.  Extremities: Significant improvement of right arm lymphedema. Compression sleeve in place.  Skin:  Benign.   Neuro:  Nonfocal, alert and oriented x 3. Breast: bilateral mastectomy with induration of bilateral incision scars. Port site looks normal. No obvious appreciable  palpable adenopathy on today's exam.  Lab Results: Lab Results  Component Value Date   WBC 9.2 09/14/2012   HGB 11.7 09/14/2012   HCT 36.2 09/14/2012   MCV 85.2 09/14/2012   PLT 158 09/14/2012   NEUTROABS 5.6 09/14/2012    Assessment:  Patient is a 56 year old [place] woman with metastatic HER-2/neu positive breast carcinoma, on Kadcyla with good results.  2. History of deep vein thrombosis of the right upper extremity with associated significant lymphedema on Coumadin  10 mg a day. INR 3.7 Persistent right upper arm lymphedema, received treatment at the lymphedema clinic, with significant improvement noted. Wears compression sleeve.  Evidence of mild peripheral neuropathy reported. 4. Mild depression.    Plan:  She has ongoing good response to kadcyla. Her INR is supratherapeutic today. Recommended she alternate 10 and 7.5 mg daily . I will see her in 3 weeks' time. We'll continue to monitor INR weekly. She has not had scan since some time though clinically I think she is doing quite well.  This plan was reviewed with the patient, who voices understanding and agreement.  She knows to call with any changes or problems.    Sayana Salley,   md 09/14/2012

## 2012-09-14 NOTE — Telephone Encounter (Signed)
Gave patient appointment for lab only on 09-19-2012 and 09-26-2012  Gave patient appointment for lab md and treatment on 10-05-2012  Sent michelle and tanya email to set up patient's treatment on 10-05-2012

## 2012-09-14 NOTE — Telephone Encounter (Signed)
Made patient appointment for echo on 09-20-2012 at 2:30pm

## 2012-09-14 NOTE — Patient Instructions (Addendum)
Take 2 coumadin pills , one day (10 mg), then 1 1/2 (7.5 mg), the next day

## 2012-09-15 LAB — COMPREHENSIVE METABOLIC PANEL
ALT: 19 U/L (ref 0–35)
AST: 41 U/L — ABNORMAL HIGH (ref 0–37)
Albumin: 3.7 g/dL (ref 3.5–5.2)
Alkaline Phosphatase: 110 U/L (ref 39–117)
BUN: 9 mg/dL (ref 6–23)
CO2: 31 mEq/L (ref 19–32)
Calcium: 9.3 mg/dL (ref 8.4–10.5)
Chloride: 100 mEq/L (ref 96–112)
Creatinine, Ser: 0.68 mg/dL (ref 0.50–1.10)
Glucose, Bld: 108 mg/dL — ABNORMAL HIGH (ref 70–99)
Potassium: 3.4 mEq/L — ABNORMAL LOW (ref 3.5–5.3)
Sodium: 139 mEq/L (ref 135–145)
Total Bilirubin: 0.2 mg/dL — ABNORMAL LOW (ref 0.3–1.2)
Total Protein: 7.2 g/dL (ref 6.0–8.3)

## 2012-09-18 ENCOUNTER — Other Ambulatory Visit: Payer: Self-pay | Admitting: Oncology

## 2012-09-19 ENCOUNTER — Other Ambulatory Visit: Payer: Medicare Other | Admitting: Lab

## 2012-09-19 ENCOUNTER — Ambulatory Visit: Payer: Medicare Other | Admitting: Lab

## 2012-09-20 ENCOUNTER — Ambulatory Visit (HOSPITAL_COMMUNITY)
Admission: RE | Admit: 2012-09-20 | Discharge: 2012-09-20 | Disposition: A | Discharge status: Home / Self Care | Payer: BC Managed Care – PPO | Source: Ambulatory Visit | Attending: Oncology | Admitting: Oncology

## 2012-09-20 DIAGNOSIS — I428 Other cardiomyopathies: Secondary | ICD-10-CM

## 2012-09-20 DIAGNOSIS — I10 Essential (primary) hypertension: Secondary | ICD-10-CM | POA: Insufficient documentation

## 2012-09-20 DIAGNOSIS — K219 Gastro-esophageal reflux disease without esophagitis: Secondary | ICD-10-CM | POA: Insufficient documentation

## 2012-09-20 DIAGNOSIS — C50919 Malignant neoplasm of unspecified site of unspecified female breast: Secondary | ICD-10-CM | POA: Insufficient documentation

## 2012-09-20 DIAGNOSIS — Z09 Encounter for follow-up examination after completed treatment for conditions other than malignant neoplasm: Secondary | ICD-10-CM

## 2012-09-20 NOTE — Progress Notes (Signed)
  Echocardiogram 2D Echocardiogram has been performed.  Kadi Hession 09/20/2012, 1:54 PM

## 2012-09-21 ENCOUNTER — Other Ambulatory Visit: Payer: Medicare Other | Admitting: Lab

## 2012-09-26 ENCOUNTER — Other Ambulatory Visit (HOSPITAL_BASED_OUTPATIENT_CLINIC_OR_DEPARTMENT_OTHER): Payer: Medicare Other | Admitting: Lab

## 2012-09-26 DIAGNOSIS — C50919 Malignant neoplasm of unspecified site of unspecified female breast: Secondary | ICD-10-CM

## 2012-09-26 DIAGNOSIS — Z5181 Encounter for therapeutic drug level monitoring: Secondary | ICD-10-CM

## 2012-09-26 DIAGNOSIS — O2230 Deep phlebothrombosis in pregnancy, unspecified trimester: Secondary | ICD-10-CM

## 2012-09-26 DIAGNOSIS — Z7901 Long term (current) use of anticoagulants: Secondary | ICD-10-CM

## 2012-09-26 LAB — PROTIME-INR
INR: 1.2 — ABNORMAL LOW (ref 2.00–3.50)
Protime: 14.4 Seconds — ABNORMAL HIGH (ref 10.6–13.4)

## 2012-09-28 ENCOUNTER — Other Ambulatory Visit: Payer: Self-pay | Admitting: Oncology

## 2012-09-28 DIAGNOSIS — C50919 Malignant neoplasm of unspecified site of unspecified female breast: Secondary | ICD-10-CM

## 2012-09-28 DIAGNOSIS — I82409 Acute embolism and thrombosis of unspecified deep veins of unspecified lower extremity: Secondary | ICD-10-CM

## 2012-10-02 ENCOUNTER — Other Ambulatory Visit: Payer: Self-pay | Admitting: *Deleted

## 2012-10-02 DIAGNOSIS — C50919 Malignant neoplasm of unspecified site of unspecified female breast: Secondary | ICD-10-CM

## 2012-10-05 ENCOUNTER — Other Ambulatory Visit (HOSPITAL_BASED_OUTPATIENT_CLINIC_OR_DEPARTMENT_OTHER): Payer: Medicare Other | Admitting: Lab

## 2012-10-05 ENCOUNTER — Telehealth: Payer: Self-pay | Admitting: *Deleted

## 2012-10-05 ENCOUNTER — Ambulatory Visit (HOSPITAL_BASED_OUTPATIENT_CLINIC_OR_DEPARTMENT_OTHER): Payer: Medicare Other | Admitting: Oncology

## 2012-10-05 ENCOUNTER — Other Ambulatory Visit: Payer: Self-pay | Admitting: *Deleted

## 2012-10-05 ENCOUNTER — Ambulatory Visit (HOSPITAL_BASED_OUTPATIENT_CLINIC_OR_DEPARTMENT_OTHER): Payer: BC Managed Care – PPO

## 2012-10-05 VITALS — BP 119/74 | HR 97 | Temp 98.5°F | Resp 20 | Ht 64.5 in | Wt 184.6 lb

## 2012-10-05 DIAGNOSIS — C50919 Malignant neoplasm of unspecified site of unspecified female breast: Secondary | ICD-10-CM

## 2012-10-05 DIAGNOSIS — Z5112 Encounter for antineoplastic immunotherapy: Secondary | ICD-10-CM

## 2012-10-05 DIAGNOSIS — I890 Lymphedema, not elsewhere classified: Secondary | ICD-10-CM

## 2012-10-05 DIAGNOSIS — C50419 Malignant neoplasm of upper-outer quadrant of unspecified female breast: Secondary | ICD-10-CM

## 2012-10-05 DIAGNOSIS — Z86718 Personal history of other venous thrombosis and embolism: Secondary | ICD-10-CM

## 2012-10-05 DIAGNOSIS — Z5181 Encounter for therapeutic drug level monitoring: Secondary | ICD-10-CM

## 2012-10-05 DIAGNOSIS — Z7901 Long term (current) use of anticoagulants: Secondary | ICD-10-CM

## 2012-10-05 LAB — CBC WITH DIFFERENTIAL/PLATELET
BASO%: 0.8 % (ref 0.0–2.0)
Basophils Absolute: 0.1 10*3/uL (ref 0.0–0.1)
EOS%: 1.9 % (ref 0.0–7.0)
Eosinophils Absolute: 0.1 10*3/uL (ref 0.0–0.5)
HCT: 38.3 % (ref 34.8–46.6)
HGB: 12.2 g/dL (ref 11.6–15.9)
LYMPH%: 34.1 % (ref 14.0–49.7)
MCH: 27 pg (ref 25.1–34.0)
MCHC: 31.9 g/dL (ref 31.5–36.0)
MCV: 84.7 fL (ref 79.5–101.0)
MONO#: 0.6 10*3/uL (ref 0.1–0.9)
MONO%: 8.4 % (ref 0.0–14.0)
NEUT#: 4 10*3/uL (ref 1.5–6.5)
NEUT%: 54.8 % (ref 38.4–76.8)
Platelets: 207 10*3/uL (ref 145–400)
RBC: 4.52 10*6/uL (ref 3.70–5.45)
RDW: 17.9 % — ABNORMAL HIGH (ref 11.2–14.5)
WBC: 7.3 10*3/uL (ref 3.9–10.3)
lymph#: 2.5 10*3/uL (ref 0.9–3.3)
nRBC: 0 % (ref 0–0)

## 2012-10-05 LAB — COMPREHENSIVE METABOLIC PANEL
ALT: 21 U/L (ref 0–35)
AST: 47 U/L — ABNORMAL HIGH (ref 0–37)
Albumin: 3.5 g/dL (ref 3.5–5.2)
Alkaline Phosphatase: 154 U/L — ABNORMAL HIGH (ref 39–117)
BUN: 9 mg/dL (ref 6–23)
CO2: 27 mEq/L (ref 19–32)
Calcium: 9.3 mg/dL (ref 8.4–10.5)
Chloride: 98 mEq/L (ref 96–112)
Creatinine, Ser: 0.77 mg/dL (ref 0.50–1.10)
Glucose, Bld: 124 mg/dL — ABNORMAL HIGH (ref 70–99)
Potassium: 3.1 mEq/L — ABNORMAL LOW (ref 3.5–5.3)
Sodium: 139 mEq/L (ref 135–145)
Total Bilirubin: 0.3 mg/dL (ref 0.3–1.2)
Total Protein: 7.7 g/dL (ref 6.0–8.3)

## 2012-10-05 LAB — PROTIME-INR
INR: 2.7 (ref 2.00–3.50)
Protime: 32.4 Seconds — ABNORMAL HIGH (ref 10.6–13.4)

## 2012-10-05 LAB — LACTATE DEHYDROGENASE: LDH: 273 U/L — ABNORMAL HIGH (ref 94–250)

## 2012-10-05 MED ORDER — DIPHENHYDRAMINE HCL 25 MG PO CAPS
50.0000 mg | ORAL_CAPSULE | Freq: Once | ORAL | Status: AC
  Administered 2012-10-05: 50 mg via ORAL

## 2012-10-05 MED ORDER — ACETAMINOPHEN 325 MG PO TABS
650.0000 mg | ORAL_TABLET | Freq: Once | ORAL | Status: AC
  Administered 2012-10-05: 650 mg via ORAL

## 2012-10-05 MED ORDER — SODIUM CHLORIDE 0.9 % IJ SOLN
10.0000 mL | INTRAMUSCULAR | Status: DC | PRN
  Administered 2012-10-05: 10 mL
  Filled 2012-10-05: qty 10

## 2012-10-05 MED ORDER — SODIUM CHLORIDE 0.9 % IV SOLN
Freq: Once | INTRAVENOUS | Status: AC
  Administered 2012-10-05: 16:00:00 via INTRAVENOUS

## 2012-10-05 MED ORDER — HEPARIN SOD (PORK) LOCK FLUSH 100 UNIT/ML IV SOLN
500.0000 [IU] | Freq: Once | INTRAVENOUS | Status: AC | PRN
  Administered 2012-10-05: 500 [IU]
  Filled 2012-10-05: qty 5

## 2012-10-05 MED ORDER — SODIUM CHLORIDE 0.9 % IV SOLN
3.6000 mg/kg | Freq: Once | INTRAVENOUS | Status: AC
  Administered 2012-10-05: 300 mg via INTRAVENOUS
  Filled 2012-10-05: qty 15

## 2012-10-05 MED ORDER — ONDANSETRON 4 MG PO TBDP: 4.0000 mg | ORAL_TABLET | Freq: Three times a day (TID) | ORAL | Status: DC | PRN

## 2012-10-05 NOTE — Patient Instructions (Signed)
[facility] Discharge Instructions for Patients Receiving Chemotherapy  Today you received the following chemotherapy agents Kadcyla  To help prevent nausea and vomiting after your treatment, we encourage you to take your nausea medication as ordered.   If you develop nausea and vomiting that is not controlled by your nausea medication, call the clinic. If it is after clinic hours your family physician or the after hours number for the clinic or go to the Emergency Department.   BELOW ARE SYMPTOMS THAT SHOULD BE REPORTED IMMEDIATELY:  *FEVER GREATER THAN 100.5 F  *CHILLS WITH OR WITHOUT FEVER  NAUSEA AND VOMITING THAT IS NOT CONTROLLED WITH YOUR NAUSEA MEDICATION  *UNUSUAL SHORTNESS OF BREATH  *UNUSUAL BRUISING OR BLEEDING  TENDERNESS IN MOUTH AND THROAT WITH OR WITHOUT PRESENCE OF ULCERS  *URINARY PROBLEMS  *BOWEL PROBLEMS  UNUSUAL RASH Items with * indicate a potential emergency and should be followed up as soon as possible.  One of the nurses will contact you 24 hours after your treatment. Please let the nurse know about any problems that you may have experienced. Feel free to call the clinic you have any questions or concerns. The clinic phone number is [phone].   I have been informed and understand all the instructions given to me. I know to contact the clinic, my physician, or go to the Emergency Department if any problems should occur. I do not have any questions at this time, but understand that I may call the clinic during office hours   should I have any questions or need assistance in obtaining follow up care.    __________________________________________  _____________  __________ Signature of Patient or Authorized Representative            Date                   Time    __________________________________________ Nurse's Signature

## 2012-10-05 NOTE — Telephone Encounter (Signed)
Gave patient  Appointment for 10-26-2012 starting at 2:00pm s  [facility] email to set up treatment

## 2012-10-05 NOTE — Telephone Encounter (Signed)
Per staff message and POF I have scheduled appt.  JMW  

## 2012-10-06 LAB — CANCER ANTIGEN 27.29: CA 27.29: 37 U/mL (ref 0–39)

## 2012-10-08 NOTE — Progress Notes (Signed)
Hematology and Oncology Follow Up Visit  [name] [id number] [birth year] 56 y.o. 10/08/2012    HPI: Patient is a 56 year old [place] woman with metastatic HER-2/neu positive left breast carcinoma. She is currently on every 3 week Kadcyla given in the palliative setting, since May 2013, evidence to suggest possible improvement of symptomatology.  2. History of deep vein thrombosis of the right upper extremity with associated significant lymphedema on Coumadin therapy 10 mg a day .  3. Persistent right upper arm lymphedema, active treatment to the lymphedema clinic.  Interim History:   Patient returns for followup. She has no active complaints. She is taking 10 mg Coumadin a day. She has no evidence of bleeding or bruising. Appetite good she is maintaining an adequate diet. She denies headaches blurred vision shortness of breath or cough. Bowels are working well to  . Remainder of the 10 point  review of systems is negative.   Physical Exam: Filed Vitals:   10/05/12 1449  BP: 119/74  Pulse: 97  Temp: 98.5 F (36.9 C)  Resp: 20  Weight: 188 lbs. HEENT:  Sclerae anicteric, conjunctivae pink.  Oropharynx clear.  No mucositis or candidiasis.   Nodes: The right upper anterior chest wall continues to significantly improve.  No obvious subcutaneous nodules. Skin is thickened, no redness or edema. No palpable supraclavicular, infraclavicular or axillary nodes.  Lungs:  Clear to auscultation without wheezing or rhonchi. Heart:  Regular rate and rhythm.   Abdomen:  Soft, nontender.  Positive bowel sounds.  No organomegaly or masses palpated.   Musculoskeletal:  No focal spinal tenderness to palpation.  Extremities: Significant improvement of right arm lymphedema. Compression sleeve in place.  Skin:  Benign.   Neuro:  Nonfocal, alert and oriented x 3. Breast: bilateral mastectomy with induration of bilateral incision scars. Port site looks normal. No obvious appreciable  palpable adenopathy on today's exam.  Lab Results: Lab Results  Component Value Date   WBC 7.3 10/05/2012   HGB 12.2 10/05/2012   HCT 38.3 10/05/2012   MCV 84.7 10/05/2012   PLT 207 10/05/2012   NEUTROABS 4.0 10/05/2012    Assessment:  Patient is a 56 year old [place] woman with metastatic HER-2/neu positive breast carcinoma, on Kadcyla with good results.  2. History of deep vein thrombosis of the right upper extremity with associated significant lymphedema on Coumadin  10 mg a day. INR 3.7 Persistent right upper arm lymphedema, received treatment at the lymphedema clinic, with significant improvement noted. Wears compression sleeve.  Evidence of mild peripheral neuropathy reported. 4. Mild depression.    Plan:  She has ongoing good response to kadcyla. Her INR istherapeutic , and she will remain on the same dose of coumadin. This plan was reviewed with the patient, who voices understanding and agreement.  She knows to call with any changes or problems.  We discussed restaging scans in the next month.   Paddy Neis,   md 10/08/2012

## 2012-10-10 ENCOUNTER — Emergency Department (INDEPENDENT_AMBULATORY_CARE_PROVIDER_SITE_OTHER)
Admission: EM | Admit: 2012-10-10 | Discharge: 2012-10-10 | Disposition: A | Discharge status: Home / Self Care | Payer: Medicare Other | Source: Home / Self Care | Attending: Family Medicine | Admitting: Family Medicine

## 2012-10-10 ENCOUNTER — Encounter (HOSPITAL_COMMUNITY): Payer: Self-pay

## 2012-10-10 DIAGNOSIS — L723 Sebaceous cyst: Secondary | ICD-10-CM

## 2012-10-10 DIAGNOSIS — L729 Follicular cyst of the skin and subcutaneous tissue, unspecified: Secondary | ICD-10-CM

## 2012-10-10 DIAGNOSIS — C50919 Malignant neoplasm of unspecified site of unspecified female breast: Secondary | ICD-10-CM

## 2012-10-10 DIAGNOSIS — J329 Chronic sinusitis, unspecified: Secondary | ICD-10-CM

## 2012-10-10 LAB — POCT RAPID STREP A: Streptococcus, Group A Screen (Direct): NEGATIVE

## 2012-10-10 MED ORDER — OXYCODONE-ACETAMINOPHEN 10-325 MG PO TABS: 1.0000 | ORAL_TABLET | Freq: Three times a day (TID) | ORAL | Status: DC | PRN

## 2012-10-10 MED ORDER — CEPHALEXIN 500 MG PO CAPS: 500.0000 mg | ORAL_CAPSULE | Freq: Four times a day (QID) | ORAL | Status: DC

## 2012-10-10 MED ORDER — CETIRIZINE HCL 10 MG PO TABS: 10.0000 mg | ORAL_TABLET | Freq: Every day | ORAL | Status: DC

## 2012-10-10 NOTE — ED Notes (Signed)
STATES SHE HAS been having cold type syx for past few days. Also concern for pain, swelling right anterior lateral rib area, that she stated result from falling out of bed a couple of days ago, and striking the table at the side of the bed

## 2012-10-11 NOTE — ED Provider Notes (Signed)
History     CSN: [id number]  Arrival date & time 10/10/12  1419   First MD Initiated Contact with Patient 10/10/12 1443      Chief Complaint  Patient presents with  . URI    (Consider location/radiation/quality/duration/timing/severity/associated sxs/prior treatment) HPI Comments: 56 year old female with a complex medical history including breast cancer on current chemotherapy, chronic anticoagulation with Coumadin and hypertension. Comes complaining of nasal congestion, sinus pressure and rhinorrhea for about 2 days. She was seen by her oncologist 2 days ago and was feeling well. She denies cough or chest pain. No fever or chills. Appetite and energy level is at baseline. No vomiting. Patient is also concerned about an area in her right lateral chest where she noted a bruise yesterday. Patient sleeps on that side and also remembers falling out of her bed over 2 days ago and possibly hitting this area against side table. Denies pain with deep inspiration.     Past Medical History  Diagnosis Date  . DVT (deep venous thrombosis) 10/07/2011  . Breast cancer     Past Surgical History  Procedure Date  . Mastectomy     bilateral    History reviewed. No pertinent family history.  History  Substance Use Topics  . Smoking status: Never Smoker   . Smokeless tobacco: Not on file  . Alcohol Use: No    OB History    Grav Para Term Preterm Abortions TAB SAB Ect Mult Living                  Review of Systems  Constitutional: Negative for fever, chills, diaphoresis, activity change and appetite change.  HENT: Positive for congestion, sore throat and rhinorrhea. Negative for trouble swallowing.   Eyes: Negative for discharge.  Respiratory: Negative for cough, chest tightness, shortness of breath and wheezing.   Cardiovascular: Negative for chest pain and leg swelling.  Gastrointestinal: Negative for vomiting and abdominal pain.  Skin:       As per history of present illness   Neurological: Negative for dizziness and headaches.  All other systems reviewed and are negative.    Allergies  Hydrocodone-acetaminophen  Home Medications   Current Outpatient Rx  Name  Route  Sig  Dispense  Refill  . ALPRAZOLAM 1 MG PO TABS   Oral   Take 1 mg by mouth 3 (three) times daily as needed. For anxiety         . AMLODIPINE BESYLATE 10 MG PO TABS   Oral   Take 10 mg by mouth daily.           Marland Kitchen VITAMIN C 100 MG PO TABS   Oral   Take 100 mg by mouth daily.           Marland Kitchen CALCIUM CARBONATE-VITAMIN D 500-200 MG-UNIT PO TABS   Oral   Take 1 tablet by mouth daily.           . CEPHALEXIN 500 MG PO CAPS   Oral   Take 1 capsule (500 mg total) by mouth 4 (four) times daily.   20 capsule   0   . CETIRIZINE HCL 10 MG PO TABS   Oral   Take 1 tablet (10 mg total) by mouth daily.   20 tablet   0   . GABAPENTIN 100 MG PO CAPS   Oral   Take 100 mg by mouth at bedtime.         Marland Kitchen HYDROCHLOROTHIAZIDE 25 MG PO TABS  TAKE 1 TABLET BY MOUTH 3 TIMES A WEEK ON MONDAY, WEDNESDAY, AND FRIDAY   30 tablet   0   . IBUPROFEN 200 MG PO TABS   Oral   Take 400 mg by mouth every 6 (six) hours as needed. For pain          . MULTIVITAMINS PO CAPS   Oral   Take 1 capsule by mouth daily.           Marland Kitchen ONDANSETRON 4 MG PO TBDP   Oral   Take 1 tablet (4 mg total) by mouth every 8 (eight) hours as needed. For nausea   30 tablet   1   . OXYCODONE HCL 5 MG PO TABS               . OXYCODONE-ACETAMINOPHEN 10-325 MG PO TABS   Oral   Take 1 tablet by mouth every 8 (eight) hours as needed for pain.   10 tablet   0   . WARFARIN SODIUM 5 MG PO TABS      TAKE 2 TABLETS (10 MG TOTAL) BY MOUTH DAILY. OR AS DIRECTED   60 tablet   2     BP 117/71  Pulse 89  Temp 98.1 F (36.7 C) (Oral)  Resp 19  SpO2 96%  Physical Exam  Nursing note and vitals reviewed. Constitutional: She is oriented to person, place, and time. She appears well-developed and  well-nourished. No distress.  HENT:  Head: Normocephalic and atraumatic.  Right Ear: External ear normal.  Left Ear: External ear normal.       Nasal Congestion with erythema and swelling of nasal turbinates, nasal mucosa appears friable, clear rhinorrhea. Mild pharyngeal erythema no exudates. No uvula deviation. No trismus. TM's normal.   Eyes: Conjunctivae normal and EOM are normal. Right eye exhibits discharge. Left eye exhibits no discharge. No scleral icterus.  Neck: Neck supple.  Cardiovascular: Normal rate, regular rhythm and normal heart sounds.   Pulmonary/Chest: Effort normal and breath sounds normal. No respiratory distress. She has no wheezes. She has no rales. She exhibits no tenderness.  Musculoskeletal:       No tenderness with palpation over ribs.   Lymphadenopathy:    She has no cervical adenopathy.  Neurological: She is alert and oriented to person, place, and time.  Skin: She is not diaphoretic.       There is a small indurated area round about 2x2cm round firm, no fluctuant. Impress superficial, i am able to grab between fingers and move, no attached to deep planes. Mildly tender. Skin above is bruised on healing stage (light purple and yellow) color. Ribs below no tender, no crepitus.     ED Course  Procedures (including critical care time)   Labs Reviewed  POCT RAPID STREP A ([facility] URG CARE ONLY)   No results found.   1. Rhinosinusitis   2. Skin cyst   3. Malignant neoplasm of breast (female), unspecified site       MDM  Likely viral rhinusinusitis giving h/o chronic rhinitis and immunocompromise decided to cover with keflex and reccommended saline nasal spray and cetirizine. Avoided steroids giving patient is likely  Immunochallenged while on chemotheraphy.  Skin lesion impress possible sebaceus cyst with bruising of the skin above likely due to sleeping on that side or direct recent trauma on this anticoalated patient does; no impress skin abscess or  hematoma although a possibility. Asked patient to follow up with her PCP in next 3-5 days for  monitoring her symptoms and skin lesion. Supportive care and red flags that should prompt patients return to medical attention earlier discussed with patient and provided in writing.         Sharin Grave, MD 10/12/12 [phone]

## 2012-10-12 ENCOUNTER — Encounter (HOSPITAL_COMMUNITY): Payer: Self-pay | Admitting: Emergency Medicine

## 2012-10-12 ENCOUNTER — Emergency Department (HOSPITAL_COMMUNITY)
Admission: EM | Admit: 2012-10-12 | Discharge: 2012-10-12 | Discharge status: Against Medical Advice | Payer: BC Managed Care – PPO | Attending: Emergency Medicine | Admitting: Emergency Medicine

## 2012-10-12 DIAGNOSIS — L509 Urticaria, unspecified: Secondary | ICD-10-CM | POA: Insufficient documentation

## 2012-10-12 NOTE — ED Notes (Addendum)
Patient c/o hives on torso (no itching) x 2 days.  Patient denies SOB or swelling.   Patient is currently on chemo for breast CA and also has a h/o MRSA.

## 2012-10-23 ENCOUNTER — Other Ambulatory Visit: Payer: Self-pay | Admitting: Physician Assistant

## 2012-10-26 ENCOUNTER — Other Ambulatory Visit (HOSPITAL_BASED_OUTPATIENT_CLINIC_OR_DEPARTMENT_OTHER): Payer: Medicare Other | Admitting: Lab

## 2012-10-26 ENCOUNTER — Ambulatory Visit (HOSPITAL_BASED_OUTPATIENT_CLINIC_OR_DEPARTMENT_OTHER): Payer: Medicare Other | Admitting: Oncology

## 2012-10-26 ENCOUNTER — Ambulatory Visit (HOSPITAL_BASED_OUTPATIENT_CLINIC_OR_DEPARTMENT_OTHER): Payer: Medicare Other

## 2012-10-26 VITALS — BP 141/87 | HR 91 | Temp 98.4°F | Resp 20 | Ht 66.0 in | Wt 178.3 lb

## 2012-10-26 DIAGNOSIS — C50219 Malignant neoplasm of upper-inner quadrant of unspecified female breast: Secondary | ICD-10-CM

## 2012-10-26 DIAGNOSIS — C773 Secondary and unspecified malignant neoplasm of axilla and upper limb lymph nodes: Secondary | ICD-10-CM

## 2012-10-26 DIAGNOSIS — C50919 Malignant neoplasm of unspecified site of unspecified female breast: Secondary | ICD-10-CM

## 2012-10-26 DIAGNOSIS — I82629 Acute embolism and thrombosis of deep veins of unspecified upper extremity: Secondary | ICD-10-CM

## 2012-10-26 DIAGNOSIS — Z5111 Encounter for antineoplastic chemotherapy: Secondary | ICD-10-CM

## 2012-10-26 DIAGNOSIS — Z7901 Long term (current) use of anticoagulants: Secondary | ICD-10-CM

## 2012-10-26 DIAGNOSIS — I890 Lymphedema, not elsewhere classified: Secondary | ICD-10-CM

## 2012-10-26 DIAGNOSIS — Z5181 Encounter for therapeutic drug level monitoring: Secondary | ICD-10-CM

## 2012-10-26 DIAGNOSIS — C778 Secondary and unspecified malignant neoplasm of lymph nodes of multiple regions: Secondary | ICD-10-CM

## 2012-10-26 LAB — CBC WITH DIFFERENTIAL/PLATELET
BASO%: 0.8 % (ref 0.0–2.0)
Basophils Absolute: 0.1 10*3/uL (ref 0.0–0.1)
EOS%: 1.1 % (ref 0.0–7.0)
Eosinophils Absolute: 0.1 10*3/uL (ref 0.0–0.5)
HCT: 41.1 % (ref 34.8–46.6)
HGB: 13.5 g/dL (ref 11.6–15.9)
LYMPH%: 24.1 % (ref 14.0–49.7)
MCH: 27.6 pg (ref 25.1–34.0)
MCHC: 32.8 g/dL (ref 31.5–36.0)
MCV: 83.9 fL (ref 79.5–101.0)
MONO#: 0.6 10*3/uL (ref 0.1–0.9)
MONO%: 9.7 % (ref 0.0–14.0)
NEUT#: 4.2 10*3/uL (ref 1.5–6.5)
NEUT%: 64.3 % (ref 38.4–76.8)
Platelets: 168 10*3/uL (ref 145–400)
RBC: 4.9 10*6/uL (ref 3.70–5.45)
RDW: 17.4 % — ABNORMAL HIGH (ref 11.2–14.5)
WBC: 6.5 10*3/uL (ref 3.9–10.3)
lymph#: 1.6 10*3/uL (ref 0.9–3.3)
nRBC: 0 % (ref 0–0)

## 2012-10-26 LAB — PROTIME-INR
INR: 1.8 — ABNORMAL LOW (ref 2.00–3.50)
Protime: 21.6 Seconds — ABNORMAL HIGH (ref 10.6–13.4)

## 2012-10-26 MED ORDER — SODIUM CHLORIDE 0.9 % IJ SOLN
10.0000 mL | INTRAMUSCULAR | Status: DC | PRN
  Filled 2012-10-26: qty 10

## 2012-10-26 MED ORDER — SODIUM CHLORIDE 0.9 % IV SOLN
Freq: Once | INTRAVENOUS | Status: AC
  Administered 2012-10-26: 17:00:00 via INTRAVENOUS

## 2012-10-26 MED ORDER — ACETAMINOPHEN 325 MG PO TABS
650.0000 mg | ORAL_TABLET | Freq: Once | ORAL | Status: AC
  Administered 2012-10-26: 650 mg via ORAL

## 2012-10-26 MED ORDER — SODIUM CHLORIDE 0.9 % IV SOLN
320.0000 mg | Freq: Once | INTRAVENOUS | Status: AC
  Administered 2012-10-26: 320 mg via INTRAVENOUS
  Filled 2012-10-26: qty 16

## 2012-10-26 MED ORDER — DIPHENHYDRAMINE HCL 25 MG PO CAPS
50.0000 mg | ORAL_CAPSULE | Freq: Once | ORAL | Status: AC
  Administered 2012-10-26: 50 mg via ORAL

## 2012-10-26 MED ORDER — HEPARIN SOD (PORK) LOCK FLUSH 100 UNIT/ML IV SOLN
500.0000 [IU] | Freq: Once | INTRAVENOUS | Status: DC | PRN
  Filled 2012-10-26: qty 5

## 2012-10-26 NOTE — Progress Notes (Signed)
Hematology and Oncology Follow Up Visit  [name] [id number] 08-Apr-1956 56 y.o. 10/26/2012    HPI: Patient is a 26 -year-old [place] woman with metastatic HER-2/neu positive left breast carcinoma. She is currently on every 3 week Kadcyla given in the palliative setting, since May 2013, evidence to suggest possible improvement of symptomatology.  2. History of deep vein thrombosis of the right upper extremity with associated significant lymphedema on Coumadin therapy 10 mg a day .  3. Persistent right upper arm lymphedema, active treatment to the lymphedema clinic.  4. Long standing hx of her2+ breast cancer , with stage 4 disease being treated for ~ 10 yrs with herceptin based therapy, alone as well as in conjunction with single agent navelbine/taxanes. She did not tolerate lapitinib.Most severe recurrence developed 4/13 when she presented with bulky rt upper chest disease and severe lymphedema of rt arm, as well as occlusion of rt svc. Almost complete clnical and imaging response to kadcyla.   Interim History:   Patient returns for followup. She has no active complaints. She is taking 10 mg Coumadin a day. She has no evidence of bleeding or bruising. Appetite good she is maintaining an adequate diet. She denies headaches blurred vision shortness of breath or cough. Bowels are working well .   Marland Kitchen Remainder of the 10 point  review of systems is negative.   Physical Exam: Filed Vitals:   10/26/12 1444  BP: 141/87  Pulse: 91  Temp: 98.4 F (36.9 C)  Resp: 20  Weight: 188 lbs. HEENT:  Sclerae anicteric, conjunctivae pink.  Oropharynx clear.  No mucositis or candidiasis.   Nodes: The right upper anterior chest wall continues to significantly improve.  No obvious subcutaneous nodules. Skin is thickened, no redness or edema. No palpable supraclavicular, infraclavicular or axillary nodes.  Lungs:  Clear to auscultation without wheezing or rhonchi. Heart:  Regular rate and  rhythm.   Abdomen:  Soft, nontender.  Positive bowel sounds.  No organomegaly or masses palpated.   Musculoskeletal:  No focal spinal tenderness to palpation.  Extremities: Significant improvement of right arm lymphedema. Compression sleeve in place.  Skin:  Benign.   Neuro:  Nonfocal, alert and oriented x 3. Breast: bilateral mastectomy with induration of bilateral incision scars. Port site looks normal. No obvious appreciable palpable adenopathy on today's exam.  Lab Results: Lab Results  Component Value Date   WBC 6.5 10/26/2012   HGB 13.5 10/26/2012   HCT 41.1 10/26/2012   MCV 83.9 10/26/2012   PLT 168 10/26/2012   NEUTROABS 4.2 10/26/2012    Assessment:  Patient is a 56 year old [place] woman with metastatic HER-2/neu positive breast carcinoma, on Kadcyla with good results.  2. History of deep vein thrombosis of the right upper extremity with associated significant lymphedema on Coumadin  10 mg a day. INR 3.7 Persistent right upper arm lymphedema, received treatment at the lymphedema clinic, with significant improvement noted. Wears compression sleeve.  Evidence of mild peripheral neuropathy reported. 4. Mild depression.    Plan:  She has ongoing good response to kadcyla. Her INR istherapeutic , and she will remain on the same dose of coumadin. This plan was reviewed with the patient, who voices understanding and agreement.  She knows to call with any changes or problems.  We discussed restaging scans in the next month.   Tyauna Lacaze,   md 10/26/2012

## 2012-10-26 NOTE — Patient Instructions (Addendum)
[facility] Discharge Instructions for Patients Receiving Chemotherapy  Today you received the following chemotherapy agents; Kadcyla   BELOW ARE SYMPTOMS THAT SHOULD BE REPORTED IMMEDIATELY:  *FEVER GREATER THAN 100.5 F  *CHILLS WITH OR WITHOUT FEVER  NAUSEA AND VOMITING THAT IS NOT CONTROLLED WITH YOUR NAUSEA MEDICATION  *UNUSUAL SHORTNESS OF BREATH  *UNUSUAL BRUISING OR BLEEDING  TENDERNESS IN MOUTH AND THROAT WITH OR WITHOUT PRESENCE OF ULCERS  *URINARY PROBLEMS  *BOWEL PROBLEMS  UNUSUAL RASH Items with * indicate a potential emergency and should be followed up as soon as possible.  Feel free to call the clinic you have any questions or concerns. The clinic phone number is [phone].    

## 2012-10-27 ENCOUNTER — Telehealth: Payer: Self-pay | Admitting: *Deleted

## 2012-10-27 NOTE — Telephone Encounter (Signed)
Called pt about transfer and got NANM.

## 2012-10-29 LAB — COMPREHENSIVE METABOLIC PANEL
ALT: 21 U/L (ref 0–35)
AST: 50 U/L — ABNORMAL HIGH (ref 0–37)
Albumin: 3.8 g/dL (ref 3.5–5.2)
Alkaline Phosphatase: 102 U/L (ref 39–117)
BUN: 12 mg/dL (ref 6–23)
CO2: 29 mEq/L (ref 19–32)
Calcium: 10.1 mg/dL (ref 8.4–10.5)
Chloride: 101 mEq/L (ref 96–112)
Creatinine, Ser: 0.71 mg/dL (ref 0.50–1.10)
Glucose, Bld: 103 mg/dL — ABNORMAL HIGH (ref 70–99)
Potassium: 3.4 mEq/L — ABNORMAL LOW (ref 3.5–5.3)
Sodium: 140 mEq/L (ref 135–145)
Total Bilirubin: 0.5 mg/dL (ref 0.3–1.2)
Total Protein: 8.1 g/dL (ref 6.0–8.3)

## 2012-10-29 LAB — LACTATE DEHYDROGENASE: LDH: 256 U/L — ABNORMAL HIGH (ref 94–250)

## 2012-10-30 ENCOUNTER — Other Ambulatory Visit: Payer: Self-pay | Admitting: *Deleted

## 2012-10-30 DIAGNOSIS — C50919 Malignant neoplasm of unspecified site of unspecified female breast: Secondary | ICD-10-CM

## 2012-11-05 ENCOUNTER — Encounter (HOSPITAL_COMMUNITY)
Admission: RE | Admit: 2012-11-05 | Discharge: 2012-11-05 | Disposition: A | Discharge status: Home / Self Care | Payer: BC Managed Care – PPO | Source: Ambulatory Visit | Attending: Oncology | Admitting: Oncology

## 2012-11-05 ENCOUNTER — Telehealth: Payer: Self-pay | Admitting: *Deleted

## 2012-11-05 DIAGNOSIS — C50919 Malignant neoplasm of unspecified site of unspecified female breast: Secondary | ICD-10-CM

## 2012-11-05 LAB — GLUCOSE, CAPILLARY: Glucose-Capillary: 100 mg/dL — ABNORMAL HIGH (ref 70–99)

## 2012-11-05 MED ORDER — FLUDEOXYGLUCOSE F - 18 (FDG) INJECTION
18.2000 | Freq: Once | INTRAVENOUS | Status: AC | PRN
  Administered 2012-11-05: 18.2 via INTRAVENOUS

## 2012-11-05 NOTE — Telephone Encounter (Signed)
Patient confirmed over the phone

## 2012-11-10 ENCOUNTER — Encounter: Payer: Self-pay | Admitting: Oncology

## 2012-11-16 ENCOUNTER — Telehealth: Payer: Self-pay | Admitting: Oncology

## 2012-11-16 ENCOUNTER — Telehealth: Payer: Self-pay | Admitting: *Deleted

## 2012-11-16 ENCOUNTER — Ambulatory Visit: Payer: Medicare Other

## 2012-11-16 ENCOUNTER — Other Ambulatory Visit (HOSPITAL_BASED_OUTPATIENT_CLINIC_OR_DEPARTMENT_OTHER): Payer: Commercial Managed Care - PPO | Admitting: Lab

## 2012-11-16 ENCOUNTER — Ambulatory Visit (HOSPITAL_BASED_OUTPATIENT_CLINIC_OR_DEPARTMENT_OTHER): Payer: Commercial Managed Care - PPO | Admitting: Oncology

## 2012-11-16 VITALS — BP 141/95 | HR 101 | Temp 98.3°F | Resp 20 | Ht 66.0 in | Wt 177.1 lb

## 2012-11-16 DIAGNOSIS — C50219 Malignant neoplasm of upper-inner quadrant of unspecified female breast: Secondary | ICD-10-CM

## 2012-11-16 DIAGNOSIS — Z7901 Long term (current) use of anticoagulants: Secondary | ICD-10-CM

## 2012-11-16 DIAGNOSIS — I82409 Acute embolism and thrombosis of unspecified deep veins of unspecified lower extremity: Secondary | ICD-10-CM

## 2012-11-16 DIAGNOSIS — C50919 Malignant neoplasm of unspecified site of unspecified female breast: Secondary | ICD-10-CM

## 2012-11-16 DIAGNOSIS — O2230 Deep phlebothrombosis in pregnancy, unspecified trimester: Secondary | ICD-10-CM

## 2012-11-16 DIAGNOSIS — Z5181 Encounter for therapeutic drug level monitoring: Secondary | ICD-10-CM

## 2012-11-16 DIAGNOSIS — C773 Secondary and unspecified malignant neoplasm of axilla and upper limb lymph nodes: Secondary | ICD-10-CM

## 2012-11-16 DIAGNOSIS — Z86718 Personal history of other venous thrombosis and embolism: Secondary | ICD-10-CM

## 2012-11-16 LAB — CBC WITH DIFFERENTIAL/PLATELET
BASO%: 0.8 % (ref 0.0–2.0)
Basophils Absolute: 0.1 10*3/uL (ref 0.0–0.1)
EOS%: 0.3 % (ref 0.0–7.0)
Eosinophils Absolute: 0 10*3/uL (ref 0.0–0.5)
HCT: 41.5 % (ref 34.8–46.6)
HGB: 13.8 g/dL (ref 11.6–15.9)
LYMPH%: 15 % (ref 14.0–49.7)
MCH: 28 pg (ref 25.1–34.0)
MCHC: 33.3 g/dL (ref 31.5–36.0)
MCV: 84.2 fL (ref 79.5–101.0)
MONO#: 0.6 10*3/uL (ref 0.1–0.9)
MONO%: 9.5 % (ref 0.0–14.0)
NEUT#: 4.8 10*3/uL (ref 1.5–6.5)
NEUT%: 74.4 % (ref 38.4–76.8)
Platelets: 168 10*3/uL (ref 145–400)
RBC: 4.93 10*6/uL (ref 3.70–5.45)
RDW: 17 % — ABNORMAL HIGH (ref 11.2–14.5)
WBC: 6.4 10*3/uL (ref 3.9–10.3)
lymph#: 1 10*3/uL (ref 0.9–3.3)

## 2012-11-16 LAB — PROTIME-INR
INR: 1.2 — ABNORMAL LOW (ref 2.00–3.50)
Protime: 14.4 Seconds — ABNORMAL HIGH (ref 10.6–13.4)

## 2012-11-16 MED ORDER — WARFARIN SODIUM 5 MG PO TABS: 10.0000 mg | ORAL_TABLET | Freq: Every day | ORAL | Status: DC

## 2012-11-16 MED ORDER — WARFARIN SODIUM 10 MG PO TABS: 10.0000 mg | ORAL_TABLET | Freq: Every day | ORAL | Status: DC

## 2012-11-16 NOTE — Telephone Encounter (Signed)
appts made and printed for pt pt aware that i will call with 1/16 appt info    anne

## 2012-11-16 NOTE — Telephone Encounter (Signed)
Per staff message and POF I have scheduled appts.  JMW  

## 2012-11-16 NOTE — Telephone Encounter (Signed)
Per patient voicemail, patient can't come to her appts today. Message forwarded and desk RN notified.  JMW

## 2012-11-16 NOTE — Telephone Encounter (Signed)
I tried to call the patient to find out why she cancelled and have her rescheduled.  No answering machine.

## 2012-11-17 NOTE — Progress Notes (Signed)
ID: [name]   DOB: 1956-03-14  MR#: [id number]  WUJ#:[id number]  PCP: Billee Cashing, MD GYN:  SU:  OTHER MD:   HISTORY OF PRESENT ILLNESS: The patient originally presented in May 2003 when she noticed a lump in the upper inner quadrant of her right breast in September 2003.  She sought attention and had a mammogram which showed an obvious carcinoma in the upper inner quadrant of the right breast, approximately 2 cm.  There were some enlarged lymph nodes in the axilla and an FNA done showed those consistent with malignant cells, most likely an invasive ductal carcinoma.  At that point she was unsure of what to do and was referred by Dr. Francina [name] for a discussion of her treatment options.  By biopsy, it was ER/PR negative and HER-2 was 3+.  DNA index was 1.42.  We reiterated that it was most important to have her disease surgically addressed at that point and had recommended lumpectomy and axillary nodes to be addressed with which she followed through and on 04-03-02, Dr. Maple [name] performed a right partial mastectomy and a right axillary lymph node dissection.  Final pathology revealed a 2.4 cm. high grade, Grade III invasive ductal carcinoma with an adjacent .8 cm. also high grade invasive ductal carcinoma which was felt to represent an intramammary metastasis rather than a second primary.  A smaller mass was just medial to the larger mass.  The smaller mass was associated with high grade DCIS component.  There was no definite lymphovascular invasion identified.  However, one of fourteen axillary lymph nodes did contain a 1.5 cm. metastatic deposit.  Postoperatively she did very well.  We reiterated the fact that she did need adjuvant chemotherapy, however, she refused and decided that she would pursue radiation.  She received radiation and completed that on 08-14-02. Her subsequent history is as detailed below  INTERVAL HISTORY: Ms. [name] returns today together with her daughter [name] to establish  herself in my practice. I have reviewed her data all the way back to 2003. Many treatments are discussed by her physicians (Drs. [name], [name], and [name]) but not otherwise documented. Other treatments are mentioned (for example lapatinib and capecitabine) but no clear starting or stopping dates are noted, and responses are not clearly defined. Unfortunately the patient herself is unable to give me a coherent account of her history. I have detailed her treatment history as best as I can reconstruct it below. As best as I can tell, this patient has never had lung, liver, bone, or brain involvement, but only loco-regional nodal recurrence from her disease  REVIEW OF SYSTEMS: The patient describes herself as fatigued, and complains of insomnia. She has some blurred vision. She has problems with her dentures. She tells me her appetite is poor. She has anxiety and depression. She tells me she just joined the methadone program locally and brought me a narcotic drug utilization review sheet showing the last dose of narcotics provided through our office at to be July of 2013. She denies unusual headaches, double vision, dizziness, gait imbalance, nausea, vomiting, confusion or focal weakness. A detailed review of systems was otherwise noncontributory   PAST MEDICAL HISTORY: Past Medical History  Diagnosis Date  . DVT (deep venous thrombosis) 10/07/2011  . Breast cancer     PAST SURGICAL HISTORY: Past Surgical History  Procedure Date  . Mastectomy     bilateral    FAMILY HISTORY No family history on file. The patient's father died in his 36 6  from complications of alcohol abuse. The patient's mother died in her 86s from pancreatic cancer. The patient has 6 brothers, 2 sisters. Both her sisters have had breast cancer, both diagnosed after the age of 51. The patient has not had genetic testing so far.  GYNECOLOGIC HISTORY: Menarche age 17; first live birth age 78. She is GXP4. She is not sure when  she stopped having periods. She never used hormone replacement.  SOCIAL HISTORY: The patient has worked in the past as a Agricultural engineer. At home in addition to the patient are her husband [name], originally from [place], who works as a Naval architect. Also son [name] age 6 and daughter [name] age 60 (she is studying to be a Engineer, civil (consulting)). The other 2 children are [name], who has a college degree in psychology and works as a Clinical research associate; and [name], who lives in [place] and works as a Nurse, adult.   ADVANCED DIRECTIVES: Not in place  HEALTH MAINTENANCE: History  Substance Use Topics  . Smoking status: Never Smoker   . Smokeless tobacco: Not on file  . Alcohol Use: No     Colonoscopy:  PAP:  Bone density:  Lipid panel:  Allergies  Allergen Reactions  . Hydrocodone-Acetaminophen Itching and Rash    Current Outpatient Prescriptions  Medication Sig Dispense Refill  . ALPRAZolam (XANAX) 1 MG tablet Take 1 mg by mouth 3 (three) times daily as needed. For anxiety      . amLODipine (NORVASC) 10 MG tablet Take 10 mg by mouth daily.        . Ascorbic Acid (VITAMIN C) 100 MG tablet Take 100 mg by mouth daily.        . calcium-vitamin D (OSCAL WITH D) 500-200 MG-UNIT per tablet Take 1 tablet by mouth daily.        . cephALEXin (KEFLEX) 500 MG capsule Take 1 capsule (500 mg total) by mouth 4 (four) times daily.  20 capsule  0  . cetirizine (ZYRTEC) 10 MG tablet Take 1 tablet (10 mg total) by mouth daily.  20 tablet  0  . gabapentin (NEURONTIN) 100 MG capsule TAKE 1 CAPSULE BY MOUTH AT BEDTIME  30 capsule  3  . hydrochlorothiazide (HYDRODIURIL) 25 MG tablet TAKE 1 TABLET BY MOUTH 3 TIMES A WEEK ON MONDAY, WEDNESDAY, AND FRIDAY  30 tablet  0  . ibuprofen (ADVIL,MOTRIN) 200 MG tablet Take 400 mg by mouth every 6 (six) hours as needed. For pain       . Multiple Vitamin (MULTIVITAMIN) capsule Take 1 capsule by mouth daily.        . ondansetron (ZOFRAN-ODT) 4 MG disintegrating tablet Take 1 tablet (4 mg  total) by mouth every 8 (eight) hours as needed. For nausea  30 tablet  1  . oxyCODONE-acetaminophen (PERCOCET) 10-325 MG per tablet Take 1 tablet by mouth every 8 (eight) hours as needed for pain.  10 tablet  0  . PRESCRIPTION MEDICATION Pt gets this every 3 weeks at cancer center. Friday 10-05-12. Next treatment is on 10-26-12. Followed by Donnie Coffin      . warfarin (COUMADIN) 10 MG tablet Take 1 tablet (10 mg total) by mouth daily.  30 tablet  12  . warfarin (COUMADIN) 5 MG tablet Take 2 tablets (10 mg total) by mouth daily. 2 tabs of 5 mg to equal 10 mg  30 tablet  12    OBJECTIVE: Middle-aged [place] American woman in no acute distress Filed Vitals:   11/16/12 1102  BP: 141/95  Pulse:  101  Temp: 98.3 F (36.8 C)  Resp: 20     Body mass index is 28.58 kg/(m^2).    ECOG FS: 1  Sclerae unicteric Oropharynx clear I do not palpate any definite cervical or supraclavicular adenopathy Lungs no rales or rhonchi Heart regular rate and rhythm Abd benign MSK no focal spinal tenderness, no peripheral edema Neuro: nonfocal Breasts: Status post bilateral mastectomies. No evidence of local recurrence. Both axillae are benign.   LAB RESULTS: Lab Results  Component Value Date   WBC 6.4 11/16/2012   NEUTROABS 4.8 11/16/2012   HGB 13.8 11/16/2012   HCT 41.5 11/16/2012   MCV 84.2 11/16/2012   PLT 168 11/16/2012      Chemistry      Component Value Date/Time   NA 140 10/26/2012 1645   NA 146* 08/09/2012 0931   K 3.4* 10/26/2012 1645   K 3.5 08/09/2012 0931   CL 101 10/26/2012 1645   CL 103 08/09/2012 0931   CO2 29 10/26/2012 1645   CO2 27 08/09/2012 0931   BUN 12 10/26/2012 1645   BUN 16.0 08/09/2012 0931   CREATININE 0.71 10/26/2012 1645   CREATININE 0.9 08/09/2012 0931      Component Value Date/Time   CALCIUM 10.1 10/26/2012 1645   CALCIUM 9.6 08/09/2012 0931   ALKPHOS 102 10/26/2012 1645   ALKPHOS 86 08/09/2012 0931   AST 50* 10/26/2012 1645   AST 38* 08/09/2012 0931   ALT 21 10/26/2012  1645   ALT 17 08/09/2012 0931   BILITOT 0.5 10/26/2012 1645   BILITOT 0.30 08/09/2012 0931       Lab Results  Component Value Date   LABCA2 37 10/05/2012    No components found with this basename: [id number]     Lab 11/16/12 1043  INR 1.20*    Urinalysis    Component Value Date/Time   COLORURINE YELLOW 05/15/2007 1200   APPEARANCEUR CLEAR 05/15/2007 1200   LABSPEC 1.020 12/29/2010 1427   PHURINE 7.0 12/29/2010 1427   GLUCOSEU NEGATIVE 08/28/2007 1238   HGBUR NEGATIVE 12/29/2010 1427   BILIRUBINUR SMALL* 12/29/2010 1427   KETONESUR TRACE* 12/29/2010 1427   PROTEINUR 100* 12/29/2010 1427   UROBILINOGEN 0.2 12/29/2010 1427   NITRITE NEGATIVE 12/29/2010 1427   LEUKOCYTESUR NEGATIVE Biochemical Testing Only. Please order routine urinalysis from main lab if confirmatory testing is needed. 12/29/2010 1427    STUDIES: Nm Pet Image Restag (ps) Skull Base To Thigh  11/05/2012  *RADIOLOGY REPORT*  Clinical Data: Subsequent treatment strategy for breast cancer.  NUCLEAR MEDICINE PET SKULL BASE TO THIGH  Fasting Blood Glucose:  100  Technique:  18.2 mCi F-18 FDG was injected intravenously. CT data was obtained and used for attenuation correction and anatomic localization only.  (This was not acquired as a diagnostic CT examination.) Additional exam technical data entered on technologist worksheet.  Comparison:  12/02/2011  Findings:  Neck:  Significant interval improvement and bilateral cervical adenopathy.  There is a residual right posterior cervical lymph node which measures 1.1 cm and has an SUV max equal to 5.30, image 22.  Chest:  There has been interval resolution of right chest wall tumor and right axillary lymph node metastases.  Hypermetabolic tumor within the subcutaneous fat of the posterior chest has also resolved.  No hypermetabolic mediastinal or hilar lymph nodes identified.  No pericardial or pleural effusion identified.  Changes of external beam radiation are identified within the  subpleural anterior right upper lobe.  No hypermetabolic mediastinal or hilar nodes.  No suspicious pulmonary nodules on the CT scan. New pulmonary nodule or density within the right lower lobe is identified, image 82.  No significant FDG uptake is associated this structure.  Abdomen/Pelvis:  No abnormal hypermetabolic activity within the liver, pancreas, adrenal glands, or spleen.  No hypermetabolic lymph nodes in the abdomen or pelvis.  Skeleton:  No focal hypermetabolic activity to suggest skeletal metastasis.  IMPRESSION:  1.  Significant interval response to therapy.  There has been resolution of hypermetabolic right anterior and posterior chest wall tumor as well as a bilateral supraclavicular and right axillary hypermetabolic adenopathy. 2.  Hypermetabolic cervical adenopathy has significantly improved in the interval. Only a solitary right posterior cervical lymph node remains which exhibits malignant FDG uptake. 3.  No evidence for hypermetabolic tumor below the diaphragm.   Original Report Authenticated By: Signa Kell, [name]     ASSESSMENT: 57 y.o. [place] woman with stage IV breast cancer manifested chiefly by her local regional nodal disease, but without lung, liver, bone, or brain involvement.  (1) status post right lumpectomy and sentinel lymph node sampling 03/04/2002 for 2 separate foci of invasive ductal carcinoma, mpT2 pN1 or stage IIA, both foci grade 3, both estrogen and progesterone receptor positive, both HercepTest 3+, Mib-1 56%  (2) the patient refused adjuvant chemotherapy or immunotherapy; reexcision for margins 05/27/2002 showed no residual cancer in the breast  (3) adjuvant radiation treatment completed 08/14/2002  (4) diffuse recurrence in the right breast 02/24/2004 showing a morphologically different tumor, again grade 3, again estrogen and progesterone receptor negative, with an MIB-1 of 14% and Herceptest 3+  (5) between May and September 2005 received dose dense  doxorubicin/cyclophosphamide x4 given with Herceptin, followed by weekly docetaxel x8, again given with Herceptin  (6) right mastectomy 07/13/2004 showed scattered microscopic foci of residual disease over an area of greater than 5 cm. Margins were negative.  (7) postoperative docetaxel continued to November of 2005  (8) trastuzumab/Herceptin given October 2005 through March 2013 with some brief interruptions  (9) isolated right cervical nodal recurrence December of 2006 treated with radiation to the right supraclavicular area (total 41.5 gray) completed 12/29/2005  (10) navelbine given together with Herceptin  January through May 2007  (9) left mastectomy 02/13/2006 for ductal carcinoma in situ, grade 2, estrogen and progesterone receptor negative, with negative margins; 0 of 3 lymph nodes involved  (10) the patient treated with lapatinib and capecitabine before December 2010, for an unclear duration and with unclear results (cannot locate data on chart review)  (11) status post right supraclavicular lymph node biopsy December 2011 again positive for an invasive ductal carcinoma, estrogen and progesterone receptor negative, HER-2 positive by C[facility] with a ratio 4.25.  (12) navelbine given together with Herceptin between July 2012 and January 2013.  (13) carboplatin/gemcitabine/Herceptin given for 2 cycles, February and March 2013  (14) TDM-1 started April 2013, most recent dose 10/05/2012. Most recent echo on 09/20/2012 showed a well preserved ejection fraction  (15) deep vein thrombosis of the right upper extremity documented 04/20/2011   PLAN: I have spent approximately 3 hours going over Ms. Sponaugle's prior data and an hour face-to-face with the patient  discussing her situation. To the best of my ability to tell, she has never had other than nodal spread of disease, and this is currently well-controlled on her current treatment. She is tolerating the TDM well, except that it does make her  feel somewhat tired at times, and her most recent echo shows a well preserved ejection  fraction. Accordingly we are continuing this treatment, and she will have a repeat echocardiogram sometime in February. Her next treatment is due 11/23/2011.  She tells me she ran out of her warfarin and that is why her INR is subtherapeutic. I wrote her a prescription for warfarin 10 mg to take daily, and referred her to the Coumadin clinic here.  The patient has never had genetic testing. Apparently she has refused this in the past. She seems more agreeable to it at present. We will refer her to our genetics counselor for further discussion.   [name],GUSTAV C    11/17/2012

## 2012-11-20 ENCOUNTER — Other Ambulatory Visit: Payer: Medicare Other | Admitting: Lab

## 2012-11-20 ENCOUNTER — Ambulatory Visit: Payer: Medicare Other | Admitting: Oncology

## 2012-11-20 ENCOUNTER — Telehealth: Payer: Self-pay | Admitting: *Deleted

## 2012-11-20 ENCOUNTER — Other Ambulatory Visit: Payer: Self-pay | Admitting: Emergency Medicine

## 2012-11-20 DIAGNOSIS — I428 Other cardiomyopathies: Secondary | ICD-10-CM

## 2012-11-20 DIAGNOSIS — C50919 Malignant neoplasm of unspecified site of unspecified female breast: Secondary | ICD-10-CM

## 2012-11-20 DIAGNOSIS — I10 Essential (primary) hypertension: Secondary | ICD-10-CM

## 2012-11-20 NOTE — Telephone Encounter (Signed)
Made genetics counseling appointment in april

## 2012-11-22 ENCOUNTER — Ambulatory Visit (HOSPITAL_BASED_OUTPATIENT_CLINIC_OR_DEPARTMENT_OTHER): Payer: Commercial Managed Care - PPO

## 2012-11-22 VITALS — BP 137/86 | HR 80 | Temp 98.4°F | Resp 20

## 2012-11-22 DIAGNOSIS — C50219 Malignant neoplasm of upper-inner quadrant of unspecified female breast: Secondary | ICD-10-CM

## 2012-11-22 DIAGNOSIS — C773 Secondary and unspecified malignant neoplasm of axilla and upper limb lymph nodes: Secondary | ICD-10-CM

## 2012-11-22 DIAGNOSIS — Z5112 Encounter for antineoplastic immunotherapy: Secondary | ICD-10-CM

## 2012-11-22 DIAGNOSIS — C50919 Malignant neoplasm of unspecified site of unspecified female breast: Secondary | ICD-10-CM

## 2012-11-22 MED ORDER — SODIUM CHLORIDE 0.9 % IV SOLN
320.0000 mg | Freq: Once | INTRAVENOUS | Status: AC
  Administered 2012-11-22: 320 mg via INTRAVENOUS
  Filled 2012-11-22: qty 16

## 2012-11-22 MED ORDER — SODIUM CHLORIDE 0.9 % IV SOLN
Freq: Once | INTRAVENOUS | Status: AC
  Administered 2012-11-22: 11:00:00 via INTRAVENOUS

## 2012-11-22 MED ORDER — DIPHENHYDRAMINE HCL 25 MG PO CAPS
25.0000 mg | ORAL_CAPSULE | Freq: Once | ORAL | Status: AC
  Administered 2012-11-22: 25 mg via ORAL

## 2012-11-22 MED ORDER — ACETAMINOPHEN 325 MG PO TABS
650.0000 mg | ORAL_TABLET | Freq: Once | ORAL | Status: AC
  Administered 2012-11-22: 650 mg via ORAL

## 2012-11-22 MED ORDER — SODIUM CHLORIDE 0.9 % IJ SOLN
10.0000 mL | INTRAMUSCULAR | Status: DC | PRN
  Administered 2012-11-22: 10 mL
  Filled 2012-11-22: qty 10

## 2012-11-22 MED ORDER — HEPARIN SOD (PORK) LOCK FLUSH 100 UNIT/ML IV SOLN
500.0000 [IU] | Freq: Once | INTRAVENOUS | Status: AC | PRN
  Administered 2012-11-22: 500 [IU]
  Filled 2012-11-22: qty 5

## 2012-11-22 NOTE — Patient Instructions (Addendum)
[facility] Discharge Instructions for Patients Receiving Chemotherapy  Today you received the following chemotherapy agents Kadcyla  To help prevent nausea and vomiting after your treatment, we encourage you to take your nausea medication as prescribed.   If you develop nausea and vomiting that is not controlled by your nausea medication, call the clinic. If it is after clinic hours your family physician or the after hours number for the clinic or go to the Emergency Department.   BELOW ARE SYMPTOMS THAT SHOULD BE REPORTED IMMEDIATELY:  *FEVER GREATER THAN 100.5 F  *CHILLS WITH OR WITHOUT FEVER  NAUSEA AND VOMITING THAT IS NOT CONTROLLED WITH YOUR NAUSEA MEDICATION  *UNUSUAL SHORTNESS OF BREATH  *UNUSUAL BRUISING OR BLEEDING  TENDERNESS IN MOUTH AND THROAT WITH OR WITHOUT PRESENCE OF ULCERS  *URINARY PROBLEMS  *BOWEL PROBLEMS  UNUSUAL RASH Items with * indicate a potential emergency and should be followed up as soon as possible.  Feel free to call the clinic you have any questions or concerns. The clinic phone number is [phone].   I have been informed and understand all the instructions given to me. I know to contact the clinic, my physician, or go to the Emergency Department if any problems should occur. I do not have any questions at this time, but understand that I may call the clinic during office hours   should I have any questions or need assistance in obtaining follow up care.    __________________________________________  _____________  __________ Signature of Patient or Authorized Representative            Date                   Time    __________________________________________ Nurse's Signature

## 2012-11-27 ENCOUNTER — Other Ambulatory Visit: Payer: Self-pay | Admitting: Oncology

## 2012-11-27 ENCOUNTER — Encounter: Payer: Self-pay | Admitting: Pharmacist

## 2012-11-27 ENCOUNTER — Other Ambulatory Visit: Payer: Self-pay | Admitting: *Deleted

## 2012-11-27 DIAGNOSIS — I82409 Acute embolism and thrombosis of unspecified deep veins of unspecified lower extremity: Secondary | ICD-10-CM

## 2012-11-27 NOTE — Progress Notes (Signed)
New consult to [facility] Coumadin clinic by Dr. Darnelle Catalan Indication for Coumadin: RUE DVT (10/07/11) Dose of Coumadin: 10 mg/day 11/16/12 INR = 1.2 but pt ran out of Coumadin Risk factors: Breast IV Dr. Darnelle Catalan would like to see pt ASAP.  I have called pt at home to see if she would like to come tomorrow since she has ECHO scheduled at [facility].  I am waiting to hear back from her. Marily Lente, Pharm.D.

## 2012-11-28 ENCOUNTER — Ambulatory Visit (HOSPITAL_BASED_OUTPATIENT_CLINIC_OR_DEPARTMENT_OTHER): Payer: Commercial Managed Care - PPO | Admitting: Pharmacist

## 2012-11-28 ENCOUNTER — Other Ambulatory Visit: Payer: Self-pay | Admitting: Physician Assistant

## 2012-11-28 ENCOUNTER — Ambulatory Visit (HOSPITAL_BASED_OUTPATIENT_CLINIC_OR_DEPARTMENT_OTHER): Payer: Commercial Managed Care - PPO | Admitting: Lab

## 2012-11-28 ENCOUNTER — Telehealth: Payer: Self-pay | Admitting: Pharmacist

## 2012-11-28 ENCOUNTER — Ambulatory Visit (HOSPITAL_COMMUNITY): Payer: BC Managed Care – PPO

## 2012-11-28 DIAGNOSIS — I82409 Acute embolism and thrombosis of unspecified deep veins of unspecified lower extremity: Secondary | ICD-10-CM

## 2012-11-28 DIAGNOSIS — R110 Nausea: Secondary | ICD-10-CM

## 2012-11-28 DIAGNOSIS — C50919 Malignant neoplasm of unspecified site of unspecified female breast: Secondary | ICD-10-CM

## 2012-11-28 LAB — PROTIME-INR
INR: 4 — ABNORMAL HIGH (ref 2.00–3.50)
Protime: 48 Seconds — ABNORMAL HIGH (ref 10.6–13.4)

## 2012-11-28 LAB — POCT INR: INR: 4

## 2012-11-28 MED ORDER — PROMETHAZINE HCL 25 MG PO TABS: 25.0000 mg | ORAL_TABLET | Freq: Four times a day (QID) | ORAL | Status: DC | PRN

## 2012-11-28 NOTE — Telephone Encounter (Signed)
Pt had left VM on Coumadin clinic phone at 9:15am  that she is able to come today at 12:30 for appt.  I called back and left VM on home phone # (cell phone # on record is no longer in service) and instructed pt to come at 12:45 for lab appt and 1pm for coumadin clinic appt.

## 2012-11-28 NOTE — Progress Notes (Signed)
Patient stop by the office today requesting additional antinausea medication. She says what she has "is not working" and tells me she has had good results in the past with promethazine. Accordingly, a prescription was sent to [facility] for promethazine, 25 mg, 1 tablet by mouth q. 6 hours as needed for nausea, dispense #30 with no refills.  Zollie Scale, PA-C 11/28/2012

## 2012-11-28 NOTE — Progress Notes (Signed)
INR above goal after taking Coumadin 10mg  for about 2 weeks.  Reviewed Coumadin education.  Instructed pt to hold coumadin x 2 days starting tomorrow (pt took already took today's dose).  Decreased coumadin to 7.5mg  daily.  Check PT/INR in 1 week.  Coumadin 7.5mg  tablets x 10 given to pt.

## 2012-12-03 NOTE — Progress Notes (Signed)
[facility] today.  Letter mailed to patient.  

## 2012-12-04 ENCOUNTER — Other Ambulatory Visit: Payer: Self-pay | Admitting: *Deleted

## 2012-12-05 ENCOUNTER — Other Ambulatory Visit: Payer: Medicare Other | Admitting: Lab

## 2012-12-05 ENCOUNTER — Ambulatory Visit: Payer: Medicare Other

## 2012-12-05 ENCOUNTER — Ambulatory Visit (HOSPITAL_COMMUNITY): Payer: Medicare Other

## 2012-12-05 ENCOUNTER — Telehealth: Payer: Self-pay | Admitting: *Deleted

## 2012-12-05 NOTE — Telephone Encounter (Signed)
Per staff message and POF I have scheduled appts.  JMW  

## 2012-12-06 ENCOUNTER — Ambulatory Visit: Payer: BC Managed Care – PPO

## 2012-12-06 ENCOUNTER — Other Ambulatory Visit (HOSPITAL_BASED_OUTPATIENT_CLINIC_OR_DEPARTMENT_OTHER): Payer: Commercial Managed Care - PPO | Admitting: Lab

## 2012-12-06 ENCOUNTER — Telehealth: Payer: Self-pay | Admitting: Oncology

## 2012-12-06 ENCOUNTER — Ambulatory Visit (HOSPITAL_BASED_OUTPATIENT_CLINIC_OR_DEPARTMENT_OTHER): Payer: Commercial Managed Care - PPO | Admitting: Pharmacist

## 2012-12-06 DIAGNOSIS — I82409 Acute embolism and thrombosis of unspecified deep veins of unspecified lower extremity: Secondary | ICD-10-CM

## 2012-12-06 DIAGNOSIS — O2230 Deep phlebothrombosis in pregnancy, unspecified trimester: Secondary | ICD-10-CM

## 2012-12-06 LAB — CBC WITH DIFFERENTIAL/PLATELET
BASO%: 0.5 % (ref 0.0–2.0)
Basophils Absolute: 0 10*3/uL (ref 0.0–0.1)
EOS%: 5.2 % (ref 0.0–7.0)
Eosinophils Absolute: 0.4 10*3/uL (ref 0.0–0.5)
HCT: 37.6 % (ref 34.8–46.6)
HGB: 12.1 g/dL (ref 11.6–15.9)
LYMPH%: 18.3 % (ref 14.0–49.7)
MCH: 27.5 pg (ref 25.1–34.0)
MCHC: 32.2 g/dL (ref 31.5–36.0)
MCV: 85.5 fL (ref 79.5–101.0)
MONO#: 1.1 10*3/uL — ABNORMAL HIGH (ref 0.1–0.9)
MONO%: 15.3 % — ABNORMAL HIGH (ref 0.0–14.0)
NEUT#: 4.5 10*3/uL (ref 1.5–6.5)
NEUT%: 60.7 % (ref 38.4–76.8)
Platelets: 149 10*3/uL (ref 145–400)
RBC: 4.4 10*6/uL (ref 3.70–5.45)
RDW: 16.2 % — ABNORMAL HIGH (ref 11.2–14.5)
WBC: 7.5 10*3/uL (ref 3.9–10.3)
lymph#: 1.4 10*3/uL (ref 0.9–3.3)
nRBC: 0 % (ref 0–0)

## 2012-12-06 LAB — PROTIME-INR
INR: 2.7 (ref 2.00–3.50)
Protime: 32.4 Seconds — ABNORMAL HIGH (ref 10.6–13.4)

## 2012-12-06 LAB — POCT INR: INR: 2.7

## 2012-12-06 NOTE — Patient Instructions (Signed)
Continue Coumadin 7.5mg  daily.  Recheck INR in 1 week with scheduled lab/infusion on 12/13/12; Lab at 9:45am, Treatment at 10am and coumadin clinic at 10:15am.

## 2012-12-06 NOTE — Telephone Encounter (Signed)
Pt given new schedule for feb/march this morning.

## 2012-12-06 NOTE — Progress Notes (Signed)
Continue Coumadin 7.5mg  daily.  Recheck INR in 1 week with scheduled lab/infusion on 12/13/12; Lab at 9:45am, Treatment at 10am and coumadin clinic at 10:15am.  Additional samples given to patient; 7.5mg  x 10 tablets.

## 2012-12-11 ENCOUNTER — Ambulatory Visit (HOSPITAL_COMMUNITY)
Admission: RE | Admit: 2012-12-11 | Discharge: 2012-12-11 | Disposition: A | Discharge status: Home / Self Care | Payer: Commercial Managed Care - PPO | Source: Ambulatory Visit | Attending: Oncology | Admitting: Oncology

## 2012-12-11 DIAGNOSIS — I10 Essential (primary) hypertension: Secondary | ICD-10-CM | POA: Insufficient documentation

## 2012-12-11 DIAGNOSIS — I359 Nonrheumatic aortic valve disorder, unspecified: Secondary | ICD-10-CM

## 2012-12-11 DIAGNOSIS — I079 Rheumatic tricuspid valve disease, unspecified: Secondary | ICD-10-CM | POA: Insufficient documentation

## 2012-12-11 DIAGNOSIS — C50919 Malignant neoplasm of unspecified site of unspecified female breast: Secondary | ICD-10-CM | POA: Diagnosis present

## 2012-12-11 DIAGNOSIS — I080 Rheumatic disorders of both mitral and aortic valves: Secondary | ICD-10-CM | POA: Insufficient documentation

## 2012-12-11 DIAGNOSIS — I428 Other cardiomyopathies: Secondary | ICD-10-CM

## 2012-12-13 ENCOUNTER — Ambulatory Visit (HOSPITAL_BASED_OUTPATIENT_CLINIC_OR_DEPARTMENT_OTHER): Payer: Commercial Managed Care - PPO

## 2012-12-13 ENCOUNTER — Ambulatory Visit: Payer: Medicare Other | Admitting: Pharmacist

## 2012-12-13 ENCOUNTER — Other Ambulatory Visit (HOSPITAL_BASED_OUTPATIENT_CLINIC_OR_DEPARTMENT_OTHER): Payer: Commercial Managed Care - PPO | Admitting: Lab

## 2012-12-13 VITALS — BP 122/79 | HR 84 | Resp 20

## 2012-12-13 DIAGNOSIS — C773 Secondary and unspecified malignant neoplasm of axilla and upper limb lymph nodes: Secondary | ICD-10-CM

## 2012-12-13 DIAGNOSIS — I82409 Acute embolism and thrombosis of unspecified deep veins of unspecified lower extremity: Secondary | ICD-10-CM

## 2012-12-13 DIAGNOSIS — Z5112 Encounter for antineoplastic immunotherapy: Secondary | ICD-10-CM

## 2012-12-13 DIAGNOSIS — C50219 Malignant neoplasm of upper-inner quadrant of unspecified female breast: Secondary | ICD-10-CM

## 2012-12-13 DIAGNOSIS — O2230 Deep phlebothrombosis in pregnancy, unspecified trimester: Secondary | ICD-10-CM

## 2012-12-13 LAB — COMPREHENSIVE METABOLIC PANEL (CC13)
ALT: 27 U/L (ref 0–55)
AST: 53 U/L — ABNORMAL HIGH (ref 5–34)
Albumin: 3.1 g/dL — ABNORMAL LOW (ref 3.5–5.0)
Alkaline Phosphatase: 152 U/L — ABNORMAL HIGH (ref 40–150)
BUN: 9.7 mg/dL (ref 7.0–26.0)
CO2: 30 mEq/L — ABNORMAL HIGH (ref 22–29)
Calcium: 9.4 mg/dL (ref 8.4–10.4)
Chloride: 102 mEq/L (ref 98–107)
Creatinine: 0.8 mg/dL (ref 0.6–1.1)
Glucose: 108 mg/dl — ABNORMAL HIGH (ref 70–99)
Potassium: 3.4 mEq/L — ABNORMAL LOW (ref 3.5–5.1)
Sodium: 142 mEq/L (ref 136–145)
Total Bilirubin: 0.44 mg/dL (ref 0.20–1.20)
Total Protein: 8.2 g/dL (ref 6.4–8.3)

## 2012-12-13 LAB — CBC WITH DIFFERENTIAL/PLATELET
BASO%: 0.8 % (ref 0.0–2.0)
Basophils Absolute: 0.1 10*3/uL (ref 0.0–0.1)
EOS%: 7.2 % — ABNORMAL HIGH (ref 0.0–7.0)
Eosinophils Absolute: 0.5 10*3/uL (ref 0.0–0.5)
HCT: 38.8 % (ref 34.8–46.6)
HGB: 12.3 g/dL (ref 11.6–15.9)
LYMPH%: 27.3 % (ref 14.0–49.7)
MCH: 27.3 pg (ref 25.1–34.0)
MCHC: 31.7 g/dL (ref 31.5–36.0)
MCV: 86 fL (ref 79.5–101.0)
MONO#: 0.8 10*3/uL (ref 0.1–0.9)
MONO%: 13.2 % (ref 0.0–14.0)
NEUT#: 3.2 10*3/uL (ref 1.5–6.5)
NEUT%: 51.5 % (ref 38.4–76.8)
Platelets: 241 10*3/uL (ref 145–400)
RBC: 4.51 10*6/uL (ref 3.70–5.45)
RDW: 16.3 % — ABNORMAL HIGH (ref 11.2–14.5)
WBC: 6.3 10*3/uL (ref 3.9–10.3)
lymph#: 1.7 10*3/uL (ref 0.9–3.3)
nRBC: 0 % (ref 0–0)

## 2012-12-13 LAB — PROTIME-INR
INR: 2.1 (ref 2.00–3.50)
Protime: 25.2 Seconds — ABNORMAL HIGH (ref 10.6–13.4)

## 2012-12-13 LAB — POCT INR: INR: 2.1

## 2012-12-13 MED ORDER — SODIUM CHLORIDE 0.9 % IV SOLN
320.0000 mg | Freq: Once | INTRAVENOUS | Status: AC
  Administered 2012-12-13: 320 mg via INTRAVENOUS
  Filled 2012-12-13: qty 16

## 2012-12-13 MED ORDER — HEPARIN SOD (PORK) LOCK FLUSH 100 UNIT/ML IV SOLN
500.0000 [IU] | Freq: Once | INTRAVENOUS | Status: DC | PRN
  Filled 2012-12-13: qty 5

## 2012-12-13 MED ORDER — DIPHENHYDRAMINE HCL 25 MG PO CAPS
25.0000 mg | ORAL_CAPSULE | Freq: Once | ORAL | Status: AC
  Administered 2012-12-13: 25 mg via ORAL

## 2012-12-13 MED ORDER — SODIUM CHLORIDE 0.9 % IV SOLN
Freq: Once | INTRAVENOUS | Status: AC
  Administered 2012-12-13: 11:00:00 via INTRAVENOUS

## 2012-12-13 MED ORDER — ACETAMINOPHEN 325 MG PO TABS
650.0000 mg | ORAL_TABLET | Freq: Once | ORAL | Status: AC
  Administered 2012-12-13: 650 mg via ORAL

## 2012-12-13 NOTE — Patient Instructions (Addendum)
INR at goal No changes Continue 7.5 mg daily. You have sample tablets to hold you until next appointment Return next week on 12/20/12 at 10:15am for lab and 10:30 for coumadin clinic

## 2012-12-13 NOTE — Progress Notes (Signed)
INR at goal. No changes. Continue Coumadin 7.5mg  daily.  Recheck INR in 1 week with scheduled lab on 12/20/12; Lab at 10:15am, Coumadin clinic at 10:30am

## 2012-12-13 NOTE — Patient Instructions (Addendum)
[facility] Discharge Instructions for Patients Receiving Chemotherapy  Today you received the following chemotherapy agents kadcyla  To help prevent nausea and vomiting after your treatment, we encourage you to take your nausea medication  and take it as often as prescribed.   If you develop nausea and vomiting that is not controlled by your nausea medication, call the clinic. If it is after clinic hours your family physician or the after hours number for the clinic or go to the Emergency Department.   BELOW ARE SYMPTOMS THAT SHOULD BE REPORTED IMMEDIATELY:  *FEVER GREATER THAN 100.5 F  *CHILLS WITH OR WITHOUT FEVER  NAUSEA AND VOMITING THAT IS NOT CONTROLLED WITH YOUR NAUSEA MEDICATION  *UNUSUAL SHORTNESS OF BREATH  *UNUSUAL BRUISING OR BLEEDING  TENDERNESS IN MOUTH AND THROAT WITH OR WITHOUT PRESENCE OF ULCERS  *URINARY PROBLEMS  *BOWEL PROBLEMS  UNUSUAL RASH Items with * indicate a potential emergency and should be followed up as soon as possible.  One of the nurses will contact you 24 hours after your treatment. Please let the nurse know about any problems that you may have experienced. Feel free to call the clinic you have any questions or concerns. The clinic phone number is [phone].   I have been informed and understand all the instructions given to me. I know to contact the clinic, my physician, or go to the Emergency Department if any problems should occur. I do not have any questions at this time, but understand that I may call the clinic during office hours   should I have any questions or need assistance in obtaining follow up care.    __________________________________________  _____________  __________ Signature of Patient or Authorized Representative            Date                   Time    __________________________________________ Nurse's Signature

## 2012-12-18 ENCOUNTER — Ambulatory Visit (HOSPITAL_BASED_OUTPATIENT_CLINIC_OR_DEPARTMENT_OTHER): Payer: Commercial Managed Care - PPO | Admitting: Pharmacist

## 2012-12-18 ENCOUNTER — Ambulatory Visit (HOSPITAL_BASED_OUTPATIENT_CLINIC_OR_DEPARTMENT_OTHER): Payer: Commercial Managed Care - PPO | Admitting: Lab

## 2012-12-18 ENCOUNTER — Ambulatory Visit: Payer: Medicare Other | Admitting: Lab

## 2012-12-18 DIAGNOSIS — I82409 Acute embolism and thrombosis of unspecified deep veins of unspecified lower extremity: Secondary | ICD-10-CM

## 2012-12-18 DIAGNOSIS — C50219 Malignant neoplasm of upper-inner quadrant of unspecified female breast: Secondary | ICD-10-CM

## 2012-12-18 DIAGNOSIS — Z86718 Personal history of other venous thrombosis and embolism: Secondary | ICD-10-CM

## 2012-12-18 LAB — CBC WITH DIFFERENTIAL/PLATELET
BASO%: 0.7 % (ref 0.0–2.0)
Basophils Absolute: 0 10*3/uL (ref 0.0–0.1)
EOS%: 3 % (ref 0.0–7.0)
Eosinophils Absolute: 0.2 10*3/uL (ref 0.0–0.5)
HCT: 39.1 % (ref 34.8–46.6)
HGB: 12.7 g/dL (ref 11.6–15.9)
LYMPH%: 21.9 % (ref 14.0–49.7)
MCH: 27.3 pg (ref 25.1–34.0)
MCHC: 32.5 g/dL (ref 31.5–36.0)
MCV: 84.1 fL (ref 79.5–101.0)
MONO#: 0.9 10*3/uL (ref 0.1–0.9)
MONO%: 16.3 % — ABNORMAL HIGH (ref 0.0–14.0)
NEUT#: 3.4 10*3/uL (ref 1.5–6.5)
NEUT%: 58.1 % (ref 38.4–76.8)
Platelets: 140 10*3/uL — ABNORMAL LOW (ref 145–400)
RBC: 4.65 10*6/uL (ref 3.70–5.45)
RDW: 16.4 % — ABNORMAL HIGH (ref 11.2–14.5)
WBC: 5.8 10*3/uL (ref 3.9–10.3)
lymph#: 1.3 10*3/uL (ref 0.9–3.3)
nRBC: 0 % (ref 0–0)

## 2012-12-18 LAB — POCT INR: INR: 2.2

## 2012-12-18 LAB — PROTIME-INR
INR: 2.2 (ref 2.00–3.50)
Protime: 26.4 Seconds — ABNORMAL HIGH (ref 10.6–13.4)

## 2012-12-18 NOTE — Progress Notes (Signed)
Fit patient into schedule today, as she was confused with her appmts.   Therapeutic today at 2.2 on 7.5 mg daily No changes to report.  She is having some nose bleeds (2-3/week) and they stop with little to no pressure applied.  No other signs of bleeding. Gave patient #20 coumadin 7.5 mg (exp 2/14 LOT [id number]) Will check her INR during infusion on 2/27

## 2012-12-18 NOTE — Patient Instructions (Addendum)
Continue Coumadin 7.5mg  daily. Recheck INR in 2 weeks with scheduled lab/infusion on 01/03/13; Lab  at 11:15am, Amy Berry 11:45am, Infusion at 12:45pm and coumadin clinic pharmacist will see in infusion at 1:00p.

## 2012-12-20 ENCOUNTER — Ambulatory Visit: Payer: Medicare Other

## 2012-12-20 ENCOUNTER — Other Ambulatory Visit: Payer: Medicare Other

## 2012-12-20 ENCOUNTER — Ambulatory Visit: Payer: BC Managed Care – PPO

## 2013-01-03 ENCOUNTER — Other Ambulatory Visit (HOSPITAL_BASED_OUTPATIENT_CLINIC_OR_DEPARTMENT_OTHER): Payer: BC Managed Care – PPO | Admitting: Lab

## 2013-01-03 ENCOUNTER — Ambulatory Visit (HOSPITAL_BASED_OUTPATIENT_CLINIC_OR_DEPARTMENT_OTHER): Payer: Commercial Managed Care - PPO

## 2013-01-03 ENCOUNTER — Ambulatory Visit: Payer: Medicare Other | Admitting: Pharmacist

## 2013-01-03 ENCOUNTER — Encounter: Payer: Self-pay | Admitting: Physician Assistant

## 2013-01-03 ENCOUNTER — Ambulatory Visit (HOSPITAL_BASED_OUTPATIENT_CLINIC_OR_DEPARTMENT_OTHER): Payer: Commercial Managed Care - PPO | Admitting: Physician Assistant

## 2013-01-03 ENCOUNTER — Encounter: Payer: Self-pay | Admitting: Oncology

## 2013-01-03 VITALS — BP 120/81 | HR 99 | Temp 98.1°F | Resp 20 | Ht 68.0 in | Wt 195.8 lb

## 2013-01-03 DIAGNOSIS — C50219 Malignant neoplasm of upper-inner quadrant of unspecified female breast: Secondary | ICD-10-CM

## 2013-01-03 DIAGNOSIS — Z5112 Encounter for antineoplastic immunotherapy: Secondary | ICD-10-CM

## 2013-01-03 DIAGNOSIS — C773 Secondary and unspecified malignant neoplasm of axilla and upper limb lymph nodes: Secondary | ICD-10-CM

## 2013-01-03 DIAGNOSIS — J019 Acute sinusitis, unspecified: Secondary | ICD-10-CM

## 2013-01-03 DIAGNOSIS — Z86718 Personal history of other venous thrombosis and embolism: Secondary | ICD-10-CM

## 2013-01-03 DIAGNOSIS — Z1231 Encounter for screening mammogram for malignant neoplasm of breast: Secondary | ICD-10-CM

## 2013-01-03 DIAGNOSIS — C50919 Malignant neoplasm of unspecified site of unspecified female breast: Secondary | ICD-10-CM

## 2013-01-03 DIAGNOSIS — I82409 Acute embolism and thrombosis of unspecified deep veins of unspecified lower extremity: Secondary | ICD-10-CM

## 2013-01-03 DIAGNOSIS — J3489 Other specified disorders of nose and nasal sinuses: Secondary | ICD-10-CM

## 2013-01-03 LAB — COMPREHENSIVE METABOLIC PANEL (CC13)
ALT: 26 U/L (ref 0–55)
AST: 64 U/L — ABNORMAL HIGH (ref 5–34)
Albumin: 3.3 g/dL — ABNORMAL LOW (ref 3.5–5.0)
Alkaline Phosphatase: 145 U/L (ref 40–150)
BUN: 14 mg/dL (ref 7.0–26.0)
CO2: 29 mEq/L (ref 22–29)
Calcium: 9.4 mg/dL (ref 8.4–10.4)
Chloride: 102 mEq/L (ref 98–107)
Creatinine: 0.8 mg/dL (ref 0.6–1.1)
Glucose: 92 mg/dl (ref 70–99)
Potassium: 3.5 mEq/L (ref 3.5–5.1)
Sodium: 139 mEq/L (ref 136–145)
Total Bilirubin: 0.76 mg/dL (ref 0.20–1.20)
Total Protein: 8.1 g/dL (ref 6.4–8.3)

## 2013-01-03 LAB — CBC WITH DIFFERENTIAL/PLATELET
BASO%: 0.7 % (ref 0.0–2.0)
Basophils Absolute: 0.1 10*3/uL (ref 0.0–0.1)
EOS%: 3.7 % (ref 0.0–7.0)
Eosinophils Absolute: 0.3 10*3/uL (ref 0.0–0.5)
HCT: 38.2 % (ref 34.8–46.6)
HGB: 12.3 g/dL (ref 11.6–15.9)
LYMPH%: 27.3 % (ref 14.0–49.7)
MCH: 27 pg (ref 25.1–34.0)
MCHC: 32.2 g/dL (ref 31.5–36.0)
MCV: 84 fL (ref 79.5–101.0)
MONO#: 1.1 10*3/uL — ABNORMAL HIGH (ref 0.1–0.9)
MONO%: 13.2 % (ref 0.0–14.0)
NEUT#: 4.4 10*3/uL (ref 1.5–6.5)
NEUT%: 55.1 % (ref 38.4–76.8)
Platelets: 153 10*3/uL (ref 145–400)
RBC: 4.55 10*6/uL (ref 3.70–5.45)
RDW: 17.2 % — ABNORMAL HIGH (ref 11.2–14.5)
WBC: 8.1 10*3/uL (ref 3.9–10.3)
lymph#: 2.2 10*3/uL (ref 0.9–3.3)
nRBC: 0 % (ref 0–0)

## 2013-01-03 LAB — PROTIME-INR
INR: 3 (ref 2.00–3.50)
Protime: 36 Seconds — ABNORMAL HIGH (ref 10.6–13.4)

## 2013-01-03 LAB — POCT INR: INR: 3

## 2013-01-03 MED ORDER — SODIUM CHLORIDE 0.9 % IJ SOLN
10.0000 mL | INTRAMUSCULAR | Status: DC | PRN
  Administered 2013-01-03: 10 mL
  Filled 2013-01-03: qty 10

## 2013-01-03 MED ORDER — SODIUM CHLORIDE 0.9 % IV SOLN
320.0000 mg | Freq: Once | INTRAVENOUS | Status: AC
  Administered 2013-01-03: 320 mg via INTRAVENOUS
  Filled 2013-01-03: qty 16

## 2013-01-03 MED ORDER — AZITHROMYCIN 250 MG PO TABS: ORAL_TABLET | ORAL | Status: DC

## 2013-01-03 MED ORDER — TRIAMTERENE-HCTZ 50-25 MG PO CAPS: 1.0000 | ORAL_CAPSULE | ORAL | Status: DC

## 2013-01-03 MED ORDER — DIPHENHYDRAMINE HCL 25 MG PO CAPS
25.0000 mg | ORAL_CAPSULE | Freq: Once | ORAL | Status: AC
  Administered 2013-01-03: 25 mg via ORAL

## 2013-01-03 MED ORDER — ACETAMINOPHEN 325 MG PO TABS
650.0000 mg | ORAL_TABLET | Freq: Once | ORAL | Status: AC
  Administered 2013-01-03: 650 mg via ORAL

## 2013-01-03 MED ORDER — PROMETHAZINE-DM 6.25-15 MG/5ML PO SYRP: 5.0000 mL | ORAL_SOLUTION | Freq: Four times a day (QID) | ORAL | Status: DC | PRN

## 2013-01-03 MED ORDER — HEPARIN SOD (PORK) LOCK FLUSH 100 UNIT/ML IV SOLN
500.0000 [IU] | Freq: Once | INTRAVENOUS | Status: AC | PRN
  Administered 2013-01-03: 500 [IU]
  Filled 2013-01-03: qty 5

## 2013-01-03 MED ORDER — SODIUM CHLORIDE 0.9 % IV SOLN
Freq: Once | INTRAVENOUS | Status: AC
  Administered 2013-01-03: 13:00:00 via INTRAVENOUS

## 2013-01-03 NOTE — Patient Instructions (Addendum)
[facility] Discharge Instructions for Patients Receiving Chemotherapy  Today you received the following chemotherapy agents Kadcyla  To help prevent nausea and vomiting after your treatment, we encourage you to take your nausea medication as prescribed.    If you develop nausea and vomiting that is not controlled by your nausea medication, call the clinic. If it is after clinic hours your family physician or the after hours number for the clinic or go to the Emergency Department.   BELOW ARE SYMPTOMS THAT SHOULD BE REPORTED IMMEDIATELY:  *FEVER GREATER THAN 100.5 F  *CHILLS WITH OR WITHOUT FEVER  NAUSEA AND VOMITING THAT IS NOT CONTROLLED WITH YOUR NAUSEA MEDICATION  *UNUSUAL SHORTNESS OF BREATH  *UNUSUAL BRUISING OR BLEEDING  TENDERNESS IN MOUTH AND THROAT WITH OR WITHOUT PRESENCE OF ULCERS  *URINARY PROBLEMS  *BOWEL PROBLEMS  UNUSUAL RASH Items with * indicate a potential emergency and should be followed up as soon as possible.  Please let the nurse know about any problems that you may have experienced. Feel free to call the clinic you have any questions or concerns. The clinic phone number is [phone].   I have been informed and understand all the instructions given to me. I know to contact the clinic, my physician, or go to the Emergency Department if any problems should occur. I do not have any questions at this time, but understand that I may call the clinic during office hours   should I have any questions or need assistance in obtaining follow up care.    __________________________________________  _____________  __________ Signature of Patient or Authorized Representative            Date                   Time    __________________________________________ Nurse's Signature

## 2013-01-03 NOTE — Patient Instructions (Addendum)
Continue Coumadin 7.5mg  daily. Recheck INR in 3 weeks with scheduled lab/infusion on 01/24/13; Lab  at 11:45am, Infusion at 12:15pm and coumadin clinic pharmacist will see in infusion at 12:30p.

## 2013-01-03 NOTE — Progress Notes (Signed)
Pt seen today in infusion area.   She inquired about changing to brand name coumadin as she felt like she was getting "tumors" all over her body with generic. Pt left infusion area prior to me getting her #30 samples of the 7.5 mg coumadin.  (lot #[id number]  Exp 5/15) Will call and her pick them up on Friday. She will continue 7.5 mg daily and RTC on 3/20 at 11:45 am lab, 12:15 infusion and 12:30 coumadin clinic.

## 2013-01-03 NOTE — Progress Notes (Signed)
ID: [name]   DOB: [birth year]  MR#: [id number]  WUJ#:[id number]  PCP: Billee Cashing, MD GYN:  SU:  OTHER MD:   HISTORY OF PRESENT ILLNESS: The patient originally presented in May 2003 when she noticed a lump in the upper inner quadrant of her right breast in September 2003.  She sought attention and had a mammogram which showed an obvious carcinoma in the upper inner quadrant of the right breast, approximately 2 cm.  There were some enlarged lymph nodes in the axilla and an FNA done showed those consistent with malignant cells, most likely an invasive ductal carcinoma.  At that point she was unsure of what to do and was referred by Dr. Francina Ames for a discussion of her treatment options.  By biopsy, it was ER/PR negative and HER-2 was 3+.  DNA index was 1.42.  We reiterated that it was most important to have her disease surgically addressed at that point and had recommended lumpectomy and axillary nodes to be addressed with which she followed through and on 04-03-02, Dr. Maple Hudson performed a right partial mastectomy and a right axillary lymph node dissection.  Final pathology revealed a 2.4 cm. high grade, Grade III invasive ductal carcinoma with an adjacent .8 cm. also high grade invasive ductal carcinoma which was felt to represent an intramammary metastasis rather than a second primary.  A smaller mass was just medial to the larger mass.  The smaller mass was associated with high grade DCIS component.  There was no definite lymphovascular invasion identified.  However, one of fourteen axillary lymph nodes did contain a 1.5 cm. metastatic deposit.  Postoperatively she did very well.  We reiterated the fact that she did need adjuvant chemotherapy, however, she refused and decided that she would pursue radiation.  She received radiation and completed that on 08-14-02. Her subsequent history is as detailed below  INTERVAL HISTORY: [name] returns today together with her daughter [name] for followup  of her recurrent breast carcinoma. She continues to receive Kadcyla every 3 weeks, and is due for dosing today. Her recent echocardiogram on February 4 showed a well preserved ejection fraction.  [name] is followed by our Coumadin clinic for her history of DVT. She tells me that in the past she has been intolerant of generic warfarin and that causes increased joint pain and body aches. She's requested that we order brand name Coumadin and will be glad to do so. She denies any abnormal bleeding or bruising.  [name] has had some increased swelling in her lower extremities. She takes hydrochlorothiazide 3 times weekly with only minimal relief.  She also complains of the recent onset of a cough, productive of yellow and light green phlegm. She denies hemoptysis. She feels slightly short of breath with exertion, but denies any orthopnea. She's had no known fevers and no chills. She has a lot of sinus congestion. She has taken promethazine syrup in the past for cough, and also finds that it is helpful for her nausea.   REVIEW OF SYSTEMS: [name] has had no skin changes. She continues to feel very tired and fatigued. She has a decreased appetite and occasional nausea. She is managing to have regular bowel movements despite her pain medication. She's had no abnormal headaches, dizziness, gait disturbance, or confusion. She continues to have some anxiety and depression, neither of which aren't new.  A detailed review of systems is otherwise stable and noncontributory.    PAST MEDICAL HISTORY: Past Medical History  Diagnosis Date  .  DVT (deep venous thrombosis) 10/07/2011  . Breast cancer     PAST SURGICAL HISTORY: Past Surgical History  Procedure Laterality Date  . Mastectomy      bilateral    FAMILY HISTORY No family history on file. The patient's father died in his 57 6 from complications of alcohol abuse. The patient's mother died in her 102s from pancreatic cancer. The patient has 6  brothers, 2 sisters. Both her sisters have had breast cancer, both diagnosed after the age of 58. The patient has not had genetic testing so far.  GYNECOLOGIC HISTORY: Menarche age 57; first live birth age 48. She is GXP4. She is not sure when she stopped having periods. She never used hormone replacement.  SOCIAL HISTORY: The patient has worked in the past as a Agricultural engineer. At home in addition to the patient are her husband [name], originally from [place], who works as a Naval architect. Also son [name] age 40 and daughter [name] age 92 (she is studying to be a Engineer, civil (consulting)). The other 2 children are [name], who has a college degree in psychology and works as a Clinical research associate; and [name], who lives in [place] and works as a Nurse, adult.   ADVANCED DIRECTIVES: Not in place  HEALTH MAINTENANCE: History  Substance Use Topics  . Smoking status: Never Smoker   . Smokeless tobacco: Not on file  . Alcohol Use: No     Colonoscopy:  PAP:  Bone density:  Lipid panel:  Allergies  Allergen Reactions  . Hydrocodone-Acetaminophen Itching and Rash    Current Outpatient Prescriptions  Medication Sig Dispense Refill  . ALPRAZolam (XANAX) 1 MG tablet Take 1 mg by mouth 3 (three) times daily as needed. For anxiety      . amLODipine (NORVASC) 10 MG tablet Take 10 mg by mouth daily.        . Ascorbic Acid (VITAMIN C) 100 MG tablet Take 100 mg by mouth daily.        . calcium-vitamin D (OSCAL WITH D) 500-200 MG-UNIT per tablet Take 1 tablet by mouth daily.        Marland Kitchen gabapentin (NEURONTIN) 100 MG capsule TAKE 1 CAPSULE BY MOUTH AT BEDTIME  30 capsule  3  . hydrochlorothiazide (HYDRODIURIL) 25 MG tablet TAKE 1 TABLET BY MOUTH 3 TIMES A WEEK ON MONDAY, WEDNESDAY, AND FRIDAY  30 tablet  0  . Multiple Vitamin (MULTIVITAMIN) capsule Take 1 capsule by mouth daily.        . promethazine (PHENERGAN) 25 MG tablet Take 1 tablet (25 mg total) by mouth every 6 (six) hours as needed for nausea.  30 tablet  0  . warfarin  (COUMADIN) 10 MG tablet Take 1 tablet (10 mg total) by mouth daily.  30 tablet  12  . warfarin (COUMADIN) 5 MG tablet Take 2 tablets (10 mg total) by mouth daily. 2 tabs of 5 mg to equal 10 mg  30 tablet  12  . azithromycin (ZITHROMAX Z-PAK) 250 MG tablet 2 tabs by mouth x 1 day, then 1 tab by mouth daily for a total of 5 days  6 each  0  . oxymorphone (OPANA) 10 MG tablet       . PRESCRIPTION MEDICATION Pt gets this every 3 weeks at cancer center. Friday 10-05-12. Next treatment is on 10-26-12. Followed by Donnie Coffin      . promethazine-dextromethorphan (PROMETHAZINE-DM) 6.25-15 MG/5ML syrup Take 5 mLs by mouth 4 (four) times daily as needed for cough.  118 mL  0  .  triamterene-hydrochlorothiazide (DYAZIDE) 50-25 MG per capsule Take 1 capsule by mouth every morning.  30 capsule  2   No current facility-administered medications for this visit.    OBJECTIVE: Middle-aged [place] American woman in no acute distress Filed Vitals:   01/03/13 1154  BP: 120/81  Pulse: 99  Temp: 98.1 F (36.7 C)  Resp: 20     Body mass index is 29.78 kg/(m^2).    ECOG FS: 1 Filed Weights   01/03/13 1154  Weight: 195 lb 12.8 oz (88.814 kg)   Sclerae unicteric I do not palpate any definite cervical or supraclavicular adenopathy Lungs no rales or rhonchi Heart regular rate and rhythm Abdomen nontender to palpation with positive bowel sounds MSK no focal spinal tenderness, 1+ pitting edema bilaterally in the lower extremities. There some small palpable masses consistent with lipomas palpated in the anterior thighs bilaterally Neuro: nonfocal Breasts: Deferred   LAB RESULTS: Lab Results  Component Value Date   WBC 8.1 01/03/2013   NEUTROABS 4.4 01/03/2013   HGB 12.3 01/03/2013   HCT 38.2 01/03/2013   MCV 84.0 01/03/2013   PLT 153 01/03/2013      Chemistry      Component Value Date/Time   NA 142 12/13/2012 0950   NA 140 10/26/2012 1645   K 3.4* 12/13/2012 0950   K 3.4* 10/26/2012 1645   CL 102 12/13/2012 0950    CL 101 10/26/2012 1645   CO2 30* 12/13/2012 0950   CO2 29 10/26/2012 1645   BUN 9.7 12/13/2012 0950   BUN 12 10/26/2012 1645   CREATININE 0.8 12/13/2012 0950   CREATININE 0.71 10/26/2012 1645      Component Value Date/Time   CALCIUM 9.4 12/13/2012 0950   CALCIUM 10.1 10/26/2012 1645   ALKPHOS 152* 12/13/2012 0950   ALKPHOS 102 10/26/2012 1645   AST 53* 12/13/2012 0950   AST 50* 10/26/2012 1645   ALT 27 12/13/2012 0950   ALT 21 10/26/2012 1645   BILITOT 0.44 12/13/2012 0950   BILITOT 0.5 10/26/2012 1645       Lab Results  Component Value Date   LABCA2 37 10/05/2012     Recent Labs Lab 01/03/13 1123  INR 3.00    STUDIES:  Most recent echocardiogram on 12/11/2012 showed an ejection fraction of 55-60%.   ASSESSMENT: 57 y.o. [place] woman with stage IV breast cancer manifested chiefly by her local regional nodal disease, but without lung, liver, bone, or brain involvement.  (1) status post right lumpectomy and sentinel lymph node sampling 03/04/2002 for 2 separate foci of invasive ductal carcinoma, mpT2 pN1 or stage IIA, both foci grade 3, both estrogen and progesterone receptor positive, both HercepTest 3+, Mib-1 56%  (2) the patient refused adjuvant chemotherapy or immunotherapy; reexcision for margins 05/27/2002 showed no residual cancer in the breast  (3) adjuvant radiation treatment completed 08/14/2002  (4) diffuse recurrence in the right breast 02/24/2004 showing a morphologically different tumor, again grade 3, again estrogen and progesterone receptor negative, with an MIB-1 of 14% and Herceptest 3+  (5) between May and September 2005 received dose dense doxorubicin/cyclophosphamide x4 given with Herceptin, followed by weekly docetaxel x8, again given with Herceptin  (6) right mastectomy 07/13/2004 showed scattered microscopic foci of residual disease over an area of greater than 5 cm. Margins were negative.  (7) postoperative docetaxel continued to November of  2005  (8) trastuzumab/Herceptin given October 2005 through March 2013 with some brief interruptions  (9) isolated right cervical nodal recurrence December of 2006 treated  with radiation to the right supraclavicular area (total 41.5 gray) completed 12/29/2005  (10) navelbine given together with Herceptin  January through May 2007  (9) left mastectomy 02/13/2006 for ductal carcinoma in situ, grade 2, estrogen and progesterone receptor negative, with negative margins; 0 of 3 lymph nodes involved  (10) the patient treated with lapatinib and capecitabine before December 2010, for an unclear duration and with unclear results (cannot locate data on chart review)  (11) status post right supraclavicular lymph node biopsy December 2011 again positive for an invasive ductal carcinoma, estrogen and progesterone receptor negative, HER-2 positive by CISH with a ratio 4.25.  (12) navelbine given together with Herceptin between July 2012 and January 2013.  (13) carboplatin/gemcitabine/Herceptin given for 2 cycles, February and March 2013  (14) TDM-1 Daiva Huge) started April 2013. Most recent echo on 12/11/2012 showed a well preserved ejection fraction  (15) deep vein thrombosis of the right upper extremity documented 04/20/2011   PLAN: This case has been reviewed with Dr. Darnelle Catalan. [name] will receive her next dose of Kadcyla today as planned, and will continue on a Q. three-week basis.  We will plan on restaging her with a PET scan in early April, prior to her appointment with Dr. Darnelle Catalan on April 10. She'll be due for her next echocardiogram in early may.  In the meanwhile, she'll continue to be followed by the Coumadin clinic. We will request that her prescriptions be filled with brand name Coumadin rather than generic warfarin in the future to prevent joint pain. Dr. Darnelle Catalan have suggested stopping the hydrochlorothiazide, and replacing it with Dyazide taken daily for the peripheral edema. Also  starting her on a Z-Pak and have called in a prescription for Phenergan DM for her cough. I've given her all these instructions in writing, and also advised her to call with any fevers or increased cough. Otherwise she'll return in 3 weeks for treatment, and in 6 weeks for followup.  [name] voices understanding and agreement with this plan and will call with any changes or problems.   [name]    01/03/2013

## 2013-01-08 ENCOUNTER — Telehealth: Payer: Self-pay | Admitting: Pharmacist

## 2013-01-08 ENCOUNTER — Telehealth: Payer: Self-pay | Admitting: Oncology

## 2013-01-08 ENCOUNTER — Telehealth: Payer: Self-pay | Admitting: *Deleted

## 2013-01-08 NOTE — Telephone Encounter (Signed)
S/w the pt and she is aware of her next appt in march. Made the pt aware to pick up her April schedule when she comes in the bldg.

## 2013-01-08 NOTE — Telephone Encounter (Signed)
Sent michelle A  Staff message to add the pt's chemo appt in for April. S/w melissa and she is aware that the pt will be needing a coumadin appt on that same day as the 02/14/2013 appts.

## 2013-01-08 NOTE — Telephone Encounter (Signed)
Pt called this morning & left voicemail on CC phone stating she had a nose bleed all night last night & is weak today.  She is concerned that the switch from warfarin --> Coumadin may be the cause. I attempted to call her back at the # she left on vm ([phone]) but no answer.  I left a vm to have her call me back in pharmacy. Ebony Hail, Pharm.D., CPP 01/08/2013.now

## 2013-01-08 NOTE — Telephone Encounter (Signed)
Per staff message and POF I have scheduled appts.  JMW  

## 2013-01-08 NOTE — Telephone Encounter (Signed)
Pt returned call to pharmacy this evening.  She stated she had a nosebleed last night & when she woke up at 8 pm, she had blood on her pillowcase. She states she has only received Coumadin samples from [name] once (although we have documented that we gave samples more than once).  She just started taking Coumadin (brand) on 01/03/13.  She would like to come 3/7 for INR check. It is worth noting that her INR was at goal on warfarin 7.5 mg/day. Marily Lente, Pharm.D.

## 2013-01-08 NOTE — Telephone Encounter (Signed)
Centralized scheduling will set up the pet scan appt.

## 2013-01-11 ENCOUNTER — Other Ambulatory Visit: Payer: Medicare Other | Admitting: Lab

## 2013-01-11 ENCOUNTER — Ambulatory Visit: Payer: Medicare Other

## 2013-01-15 ENCOUNTER — Ambulatory Visit: Payer: Medicare Other

## 2013-01-15 ENCOUNTER — Other Ambulatory Visit: Payer: Medicare Other | Admitting: Lab

## 2013-01-24 ENCOUNTER — Other Ambulatory Visit: Payer: Self-pay | Admitting: Physician Assistant

## 2013-01-24 ENCOUNTER — Encounter (INDEPENDENT_AMBULATORY_CARE_PROVIDER_SITE_OTHER): Payer: Self-pay | Admitting: General Surgery

## 2013-01-24 ENCOUNTER — Ambulatory Visit: Payer: Medicare Other

## 2013-01-24 ENCOUNTER — Telehealth: Payer: Self-pay | Admitting: *Deleted

## 2013-01-24 ENCOUNTER — Telehealth (INDEPENDENT_AMBULATORY_CARE_PROVIDER_SITE_OTHER): Payer: Self-pay

## 2013-01-24 ENCOUNTER — Other Ambulatory Visit (HOSPITAL_BASED_OUTPATIENT_CLINIC_OR_DEPARTMENT_OTHER): Payer: Commercial Managed Care - PPO | Admitting: Lab

## 2013-01-24 ENCOUNTER — Ambulatory Visit (HOSPITAL_BASED_OUTPATIENT_CLINIC_OR_DEPARTMENT_OTHER): Payer: Commercial Managed Care - PPO | Admitting: Pharmacist

## 2013-01-24 ENCOUNTER — Ambulatory Visit (INDEPENDENT_AMBULATORY_CARE_PROVIDER_SITE_OTHER): Payer: Commercial Managed Care - PPO | Admitting: General Surgery

## 2013-01-24 VITALS — BP 114/80 | HR 96 | Temp 98.0°F | Ht 66.0 in | Wt 182.8 lb

## 2013-01-24 DIAGNOSIS — L0390 Cellulitis, unspecified: Secondary | ICD-10-CM

## 2013-01-24 DIAGNOSIS — I82409 Acute embolism and thrombosis of unspecified deep veins of unspecified lower extremity: Secondary | ICD-10-CM

## 2013-01-24 DIAGNOSIS — O2230 Deep phlebothrombosis in pregnancy, unspecified trimester: Secondary | ICD-10-CM

## 2013-01-24 DIAGNOSIS — T80212A Local infection due to central venous catheter, initial encounter: Secondary | ICD-10-CM

## 2013-01-24 DIAGNOSIS — M62838 Other muscle spasm: Secondary | ICD-10-CM

## 2013-01-24 DIAGNOSIS — C50919 Malignant neoplasm of unspecified site of unspecified female breast: Secondary | ICD-10-CM

## 2013-01-24 LAB — PROTIME-INR
INR: 1.2 — ABNORMAL LOW (ref 2.00–3.50)
Protime: 14.4 Seconds — ABNORMAL HIGH (ref 10.6–13.4)

## 2013-01-24 LAB — CBC WITH DIFFERENTIAL/PLATELET
BASO%: 0.6 % (ref 0.0–2.0)
Basophils Absolute: 0.1 10*3/uL (ref 0.0–0.1)
EOS%: 1.6 % (ref 0.0–7.0)
Eosinophils Absolute: 0.1 10*3/uL (ref 0.0–0.5)
HCT: 41.1 % (ref 34.8–46.6)
HGB: 13.3 g/dL (ref 11.6–15.9)
LYMPH%: 29.5 % (ref 14.0–49.7)
MCH: 26.9 pg (ref 25.1–34.0)
MCHC: 32.4 g/dL (ref 31.5–36.0)
MCV: 83 fL (ref 79.5–101.0)
MONO#: 1.1 10*3/uL — ABNORMAL HIGH (ref 0.1–0.9)
MONO%: 13 % (ref 0.0–14.0)
NEUT#: 4.6 10*3/uL (ref 1.5–6.5)
NEUT%: 55.3 % (ref 38.4–76.8)
Platelets: 154 10*3/uL (ref 145–400)
RBC: 4.95 10*6/uL (ref 3.70–5.45)
RDW: 17 % — ABNORMAL HIGH (ref 11.2–14.5)
WBC: 8.3 10*3/uL (ref 3.9–10.3)
lymph#: 2.5 10*3/uL (ref 0.9–3.3)
nRBC: 0 % (ref 0–0)

## 2013-01-24 LAB — POCT INR: INR: 1.2

## 2013-01-24 MED ORDER — TRIAMTERENE-HCTZ 37.5-25 MG PO CAPS: 1.0000 | ORAL_CAPSULE | ORAL | Status: DC

## 2013-01-24 MED ORDER — CEPHALEXIN 500 MG PO CAPS: 500.0000 mg | ORAL_CAPSULE | Freq: Four times a day (QID) | ORAL | Status: DC

## 2013-01-24 MED ORDER — METAXALONE 800 MG PO TABS: 800.0000 mg | ORAL_TABLET | Freq: Three times a day (TID) | ORAL | Status: DC

## 2013-01-24 NOTE — Telephone Encounter (Signed)
Patient in Coumadin clinic.  This nurse assessed port-a-cath.  Right chest port-a-cath has swelling and feels warm to touch.  Firmness noted above port-a-cath.  Dark red to burgundy color noted from clavicle to distal area below port.  Amy Allyson Sabal PA-C notified and will see patient.  Vitals at 1:00 T= 98.3, P= 109, R= 18, B/P= 120/76, O2 sat = 98%.

## 2013-01-24 NOTE — Progress Notes (Signed)
I saw [name] briefly in the Coumadin clinic today. She's also been evaluated by Dr. Darnelle Catalan. She was complaining of pain and drainage in the port site, right chest wall. On exam, the area is extremely erythematous, hyperpigmented, and swollen around the port.  Dr. Darnelle Catalan agrees with starting [name] on Keflex, 500 mg by mouth 4 times a day for the next 10 days, and we are also referring her back to her surgeon as soon as possible for further evaluation and likely removal. She will need either a port or PICC placed once she has recovered in order to continue her therapy.  I have refilled [name]'s Dyazide, prescribed Keflex for the infection , and also Skelaxin for muscle spasms in the lower extremities. I will mention that she has a "lump" in the left calf which "comes and goes". There is no swelling of the lower extremities, and she denies any pain other than the occasional muscle spasms as noted above.  She continues on Coumadin although her INR was sub therapeutic today at 1.2.  This will of course need to be taken into consideration of [name] has to undergo a surgical procedure.  [name] Scale, PA-C 01/24/2013

## 2013-01-24 NOTE — Progress Notes (Addendum)
INR below goal today.  This is most likely due to patient taking 3.75mg  daily for the past 2 weeks.  She also missed "a dose or two" in the last 2 weeks. She decreased the dose on her own after having a nosebleed and multiple bruises. Pt likely has an infected port after being evaluated by Zollie Scale today. She is being urgently scheduled to see a surgeon for possible port removal. She is being started on Keflex. This should only have a minor effect (if any) on her INR, Pt asked is she could have something for leg spasms. Amy will send rx for skelaxin. Increase coumadin dose back to 7.5mg  daily. Ok to continue coumadin for now. No lovenox coverage at this time per discussion with Amy.  Recheck INR in 1 week on 3/27; lab at 1:30pm and coumadin clinic at 1:45pm. Amy/Dr. Darnelle Catalan will inform patient if coumadin needs to be held/stopped pending evaluation by surgeon.  Pt requested further Coumadin 7.5mg  tablets. Samples given, x 15 tablets.                                                                            Exp: 03/2014, Lot: [id number]

## 2013-01-24 NOTE — Telephone Encounter (Signed)
Vikki Ports w/Dr [name] calling b/c pt had an appt today to get infusion of Kadayla but is having problems with the PAC. The site is red,puffy,and has drainage from the site. They sent the pt hom with no infusion today and [facility] called [name] for [name] to see the pt today. I called Dr Dwain Sarna and spoke to him about the pt. Dr Dwain Sarna did not put the port in the pt it was done by IR after Dr Dwain Sarna looked the pt up in his records. Dr Dwain Sarna said the pt really needs to be seen today b/c of her symptoms so I will make the pt an appt in urgent office.

## 2013-01-24 NOTE — Telephone Encounter (Signed)
Called pt to let her know that she needs an appt today to be seen b/c of the PAC. I made her an appt with Dr Donell Beers for urgent office at 4:15 pt told to arrive at 3:45.

## 2013-01-24 NOTE — Telephone Encounter (Signed)
Spoke with patient at 11:40 am.  Says she is running late and will be here.  Lab at 11:45, Treatment at 12:15 and coumadin clinic.  Treatment area will keep patient today as scheduled.  Called patient who now says she wants to be treated for an infection to her incision and see Thayer Ohm PA.  Repors surgery was 14 years ago.  "Lots of bleeding and pus coming from my chest.  Started Sunday (01-20-2013).  I also am to see the president of the cancer center today."  Informed patient I will notify providers.

## 2013-01-24 NOTE — Patient Instructions (Signed)
Go to [facility] tomorrow.  Go to Radiology and tell them that Dr. Grace Isaac wants to see your port a cath.  He will decide if he needs to set you up for next week.  Your coumadin will probably need to be stopped.  You will also probably need to get ultrasound of your arms veins at some point to check to see if clot is still there.

## 2013-01-24 NOTE — Patient Instructions (Signed)
Increase your coumadin dose back to 7.5mg  daily.  Recheck INR in 1 week on 3/27; lab at 1:30pm and coumadin clinic at 1:45pm.

## 2013-01-28 ENCOUNTER — Ambulatory Visit (HOSPITAL_COMMUNITY)
Admission: RE | Admit: 2013-01-28 | Discharge: 2013-01-28 | Disposition: A | Discharge status: Home / Self Care | Payer: Commercial Managed Care - PPO | Source: Ambulatory Visit | Attending: Oncology | Admitting: Oncology

## 2013-01-28 ENCOUNTER — Other Ambulatory Visit: Payer: Self-pay | Admitting: Oncology

## 2013-01-28 ENCOUNTER — Ambulatory Visit (HOSPITAL_COMMUNITY)
Admission: RE | Admit: 2013-01-28 | Discharge: 2013-01-28 | Disposition: A | Discharge status: Home / Self Care | Payer: Commercial Managed Care - PPO | Source: Ambulatory Visit | Attending: Interventional Radiology | Admitting: Interventional Radiology

## 2013-01-28 DIAGNOSIS — C50919 Malignant neoplasm of unspecified site of unspecified female breast: Secondary | ICD-10-CM

## 2013-01-28 DIAGNOSIS — T80218A Other infection due to central venous catheter, initial encounter: Secondary | ICD-10-CM | POA: Insufficient documentation

## 2013-01-28 DIAGNOSIS — Z7901 Long term (current) use of anticoagulants: Secondary | ICD-10-CM | POA: Insufficient documentation

## 2013-01-28 DIAGNOSIS — Z9010 Acquired absence of unspecified breast and nipple: Secondary | ICD-10-CM | POA: Insufficient documentation

## 2013-01-28 DIAGNOSIS — D0590 Unspecified type of carcinoma in situ of unspecified breast: Secondary | ICD-10-CM | POA: Insufficient documentation

## 2013-01-28 DIAGNOSIS — Z86718 Personal history of other venous thrombosis and embolism: Secondary | ICD-10-CM | POA: Insufficient documentation

## 2013-01-28 DIAGNOSIS — Y849 Medical procedure, unspecified as the cause of abnormal reaction of the patient, or of later complication, without mention of misadventure at the time of the procedure: Secondary | ICD-10-CM | POA: Insufficient documentation

## 2013-01-28 DIAGNOSIS — Z79899 Other long term (current) drug therapy: Secondary | ICD-10-CM | POA: Insufficient documentation

## 2013-01-28 MED ORDER — FENTANYL CITRATE 0.05 MG/ML IJ SOLN
INTRAMUSCULAR | Status: AC | PRN
  Administered 2013-01-28 (×2): 50 ug via INTRAVENOUS

## 2013-01-28 MED ORDER — MIDAZOLAM HCL 2 MG/2ML IJ SOLN
INTRAMUSCULAR | Status: AC
  Filled 2013-01-28: qty 6

## 2013-01-28 MED ORDER — CEFAZOLIN SODIUM-DEXTROSE 2-3 GM-% IV SOLR
2.0000 g | INTRAVENOUS | Status: AC
  Administered 2013-01-28: 2 g via INTRAVENOUS
  Filled 2013-01-28: qty 50

## 2013-01-28 MED ORDER — SODIUM CHLORIDE 0.9 % IV SOLN: INTRAVENOUS | Status: DC

## 2013-01-28 MED ORDER — FENTANYL CITRATE 0.05 MG/ML IJ SOLN
INTRAMUSCULAR | Status: AC
  Filled 2013-01-28: qty 6

## 2013-01-28 MED ORDER — OXYCODONE-ACETAMINOPHEN 5-325 MG PO TABS
ORAL_TABLET | ORAL | Status: AC
  Filled 2013-01-28: qty 1

## 2013-01-28 MED ORDER — OXYCODONE-ACETAMINOPHEN 5-325 MG PO TABS
1.0000 | ORAL_TABLET | Freq: Once | ORAL | Status: AC
  Administered 2013-01-28: 1 via ORAL

## 2013-01-28 MED ORDER — MIDAZOLAM HCL 2 MG/2ML IJ SOLN
INTRAMUSCULAR | Status: AC | PRN
  Administered 2013-01-28 (×2): 1 mg via INTRAVENOUS

## 2013-01-28 NOTE — H&P (Signed)
[name] is an 57 y.o. female.   Chief Complaint: infected port a cath HPI: Patient with history of breast carcinoma , RUE DVT, and tender, erythematous right chest port a cath presents today for port a cath removal.  Past Medical History  Diagnosis Date  . DVT (deep venous thrombosis) 10/07/2011  . Breast cancer     Past Surgical History  Procedure Laterality Date  . Mastectomy      bilateral  . Breast surgery      No family history on file. Social History:  reports that she has never smoked. She does not have any smokeless tobacco history on file. She reports that she does not drink alcohol or use illicit drugs.  Allergies:  Allergies  Allergen Reactions  . Hydrocodone-Acetaminophen Itching and Rash    Current outpatient prescriptions:ALPRAZolam (XANAX) 1 MG tablet, Take 1 mg by mouth 3 (three) times daily as needed. For anxiety, Disp: , Rfl: ;  amLODipine (NORVASC) 10 MG tablet, Take 10 mg by mouth daily.  , Disp: , Rfl: ;  Ascorbic Acid (VITAMIN C) 100 MG tablet, Take 100 mg by mouth daily.  , Disp: , Rfl:  azithromycin (ZITHROMAX Z-PAK) 250 MG tablet, 2 tabs by mouth x 1 day, then 1 tab by mouth daily for a total of 5 days, Disp: 6 each, Rfl: 0;  calcium-vitamin D (OSCAL WITH D) 500-200 MG-UNIT per tablet, Take 1 tablet by mouth daily.  , Disp: , Rfl: ;  cephALEXin (KEFLEX) 500 MG capsule, Take 1 capsule (500 mg total) by mouth 4 (four) times daily., Disp: 40 capsule, Rfl: 0 gabapentin (NEURONTIN) 100 MG capsule, TAKE 1 CAPSULE BY MOUTH AT BEDTIME, Disp: 30 capsule, Rfl: 3;  hydrochlorothiazide (HYDRODIURIL) 25 MG tablet, TAKE 1 TABLET BY MOUTH 3 TIMES A WEEK ON MONDAY, WEDNESDAY, AND FRIDAY, Disp: 30 tablet, Rfl: 0;  metaxalone (SKELAXIN) 800 MG tablet, Take 1 tablet (800 mg total) by mouth 3 (three) times daily., Disp: 60 tablet, Rfl: 1 Multiple Vitamin (MULTIVITAMIN) capsule, Take 1 capsule by mouth daily.  , Disp: , Rfl: ;  oxyCODONE-acetaminophen (PERCOCET) 10-325 MG per  tablet, , Disp: , Rfl: ;  oxymorphone (OPANA) 10 MG tablet, , Disp: , Rfl: ;  PRESCRIPTION MEDICATION, Pt gets this every 3 weeks at cancer center. Friday 10-05-12. Next treatment is on 10-26-12. Followed by Donnie Coffin, Disp: , Rfl:  promethazine (PHENERGAN) 25 MG tablet, Take 1 tablet (25 mg total) by mouth every 6 (six) hours as needed for nausea., Disp: 30 tablet, Rfl: 0;  promethazine-dextromethorphan (PROMETHAZINE-DM) 6.25-15 MG/5ML syrup, Take 5 mLs by mouth 4 (four) times daily as needed for cough., Disp: 118 mL, Rfl: 0;  triamterene-hydrochlorothiazide (DYAZIDE) 37.5-25 MG per capsule, Take 1 each (1 capsule total) by mouth every morning., Disp: 30 capsule, Rfl: 1 warfarin (COUMADIN) 10 MG tablet, Take 1 tablet (10 mg total) by mouth daily., Disp: 30 tablet, Rfl: 12;  warfarin (COUMADIN) 5 MG tablet, Take 2 tablets (10 mg total) by mouth daily. 2 tabs of 5 mg to equal 10 mg, Disp: 30 tablet, Rfl: 12 Current facility-administered medications:0.9 %  sodium chloride infusion, , Intravenous, Continuous, D Kevin Loleta Frommelt, PA-C;  ceFAZolin (ANCEF) IVPB 2 g/50 mL premix, 2 g, Intravenous, On Call, D Jeananne Rama, PA-C  01/28/13 PT/INR pending Review of Systems  Constitutional: Negative for fever and chills.  Respiratory: Negative for shortness of breath.        Occ cough  Cardiovascular: Positive for chest pain.  Gastrointestinal: Negative for nausea  and vomiting.       Occ abd cramps  Musculoskeletal: Negative for back pain.  Neurological: Negative for headaches.  Endo/Heme/Allergies: Does not bruise/bleed easily.    Blood pressure 124/88, pulse 86, temperature 97.9 F (36.6 C), temperature source Oral, resp. rate 18, SpO2 93.00%. Physical Exam  Constitutional: She is oriented to person, place, and time. She appears well-developed and well-nourished.  Cardiovascular: Normal rate and regular rhythm.   Respiratory: Effort normal.  Few crackles at bases, L>R;  Erythematous, indurated, tender rt chest  wall port site  GI: Soft. Bowel sounds are normal. There is no tenderness.  Musculoskeletal: Normal range of motion. She exhibits no edema.  Neurological: She is alert and oriented to person, place, and time.     Assessment/Plan Pt with history of breast carcinoma, RUE DVT (on coumadin) and tender, erythematous, indurated rt chest wall port a cath site. Plan is for port a cath removal today pending f/u PT/INR. Details/risks of procedure d/w pt with her understanding and consent.  Rhina Kramme,D KEVIN 01/28/2013, 1:48 PM

## 2013-01-28 NOTE — Procedures (Signed)
Successful removal of right anterior chest wall port-a-cath. No immediate post procedural complications.  

## 2013-01-29 DIAGNOSIS — T80212A Local infection due to central venous catheter, initial encounter: Secondary | ICD-10-CM | POA: Insufficient documentation

## 2013-01-29 NOTE — Progress Notes (Signed)
Patient ID: [name], female   DOB: 01-05-1956, 57 y.o.   MRN: [id number]  Chief Complaint  Patient presents with  . Other    urge office/poss PAC infection    HPI [name] is a 57 y.o. female.   HPI Pt presents with a red, tender port a cath.  She has recurrent metastatic breast cancer, and has had this port since 2008.  It is very sore and swollen.  She reports seeing pus come out of it over the last few days.  She denies symptoms prior to the last few days.  She has had subjective fevers and chills.  She has a known blood clot in her RUQ and is on Warfarin.    Past Medical History  Diagnosis Date  . DVT (deep venous thrombosis) 10/07/2011  . Breast cancer     Past Surgical History  Procedure Laterality Date  . Mastectomy      bilateral  . Breast surgery      History reviewed. No pertinent family history.  Social History History  Substance Use Topics  . Smoking status: Never Smoker   . Smokeless tobacco: Not on file  . Alcohol Use: No    Allergies  Allergen Reactions  . Hydrocodone-Acetaminophen Itching and Rash    Current Outpatient Prescriptions  Medication Sig Dispense Refill  . ALPRAZolam (XANAX) 1 MG tablet Take 1 mg by mouth 3 (three) times daily as needed. For anxiety      . amLODipine (NORVASC) 10 MG tablet Take 10 mg by mouth daily.        . Ascorbic Acid (VITAMIN C) 100 MG tablet Take 100 mg by mouth daily.        Marland Kitchen azithromycin (ZITHROMAX Z-PAK) 250 MG tablet 2 tabs by mouth x 1 day, then 1 tab by mouth daily for a total of 5 days  6 each  0  . calcium-vitamin D (OSCAL WITH D) 500-200 MG-UNIT per tablet Take 1 tablet by mouth daily.        . cephALEXin (KEFLEX) 500 MG capsule Take 1 capsule (500 mg total) by mouth 4 (four) times daily.  40 capsule  0  . gabapentin (NEURONTIN) 100 MG capsule TAKE 1 CAPSULE BY MOUTH AT BEDTIME  30 capsule  3  . hydrochlorothiazide (HYDRODIURIL) 25 MG tablet TAKE 1 TABLET BY MOUTH 3 TIMES A WEEK ON MONDAY, WEDNESDAY,  AND FRIDAY  30 tablet  0  . metaxalone (SKELAXIN) 800 MG tablet Take 1 tablet (800 mg total) by mouth 3 (three) times daily.  60 tablet  1  . Multiple Vitamin (MULTIVITAMIN) capsule Take 1 capsule by mouth daily.        Marland Kitchen oxyCODONE-acetaminophen (PERCOCET) 10-325 MG per tablet       . oxymorphone (OPANA) 10 MG tablet       . PRESCRIPTION MEDICATION Pt gets this every 3 weeks at cancer center. Friday 10-05-12. Next treatment is on 10-26-12. Followed by Donnie Coffin      . promethazine (PHENERGAN) 25 MG tablet Take 1 tablet (25 mg total) by mouth every 6 (six) hours as needed for nausea.  30 tablet  0  . promethazine-dextromethorphan (PROMETHAZINE-DM) 6.25-15 MG/5ML syrup Take 5 mLs by mouth 4 (four) times daily as needed for cough.  118 mL  0  . triamterene-hydrochlorothiazide (DYAZIDE) 37.5-25 MG per capsule Take 1 each (1 capsule total) by mouth every morning.  30 capsule  1  . warfarin (COUMADIN) 10 MG tablet Take 1 tablet (10 mg total)  by mouth daily.  30 tablet  12  . warfarin (COUMADIN) 5 MG tablet Take 2 tablets (10 mg total) by mouth daily. 2 tabs of 5 mg to equal 10 mg  30 tablet  12   No current facility-administered medications for this visit.    Review of Systems Review of Systems  Constitutional: Positive for chills and fatigue. Negative for fever and diaphoresis.  HENT: Positive for neck pain.   Eyes: Negative.   Respiratory: Negative.   Cardiovascular: Positive for leg swelling.  Gastrointestinal: Negative.   Endocrine: Negative.   Genitourinary: Negative.   Skin: Positive for wound (significant swelling and redness, tenderness over port.  ).  Allergic/Immunologic: Negative.   Neurological: Negative.   Hematological: Negative.   Psychiatric/Behavioral: Positive for confusion.    Blood pressure 114/80, pulse 96, temperature 98 F (36.7 C), temperature source Temporal, height 5\' 6"  (1.676 m), weight 182 lb 12.8 oz (82.918 kg), SpO2 96.00%.  Physical Exam Physical Exam   Constitutional: She is oriented to person, place, and time. She appears well-developed and well-nourished. She appears distressed.  HENT:  Head: Normocephalic and atraumatic.  Eyes: Conjunctivae are normal. Pupils are equal, round, and reactive to light. No scleral icterus.  Neck: Normal range of motion. Neck supple. No tracheal deviation present. No thyromegaly present.  Cardiovascular: Normal rate, regular rhythm, normal heart sounds and intact distal pulses.  Exam reveals no gallop and no friction rub.   No murmur heard. Pulmonary/Chest: Effort normal. She exhibits tenderness.  Abdominal: Soft. Bowel sounds are normal. She exhibits no distension. There is no tenderness.  Musculoskeletal: Normal range of motion. She exhibits edema. She exhibits no tenderness.  Lymphadenopathy:    She has no cervical adenopathy.  Neurological: She is alert and oriented to person, place, and time.  Skin: Skin is warm. She is not diaphoretic. There is erythema.  Psychiatric:  Pt perseverating and seems anxious.  Sl confused.      Assessment/Plan  Port or reservoir infection Catheter was placed by IR.   Discussed with Dr. Grace Isaac.  They will evaluate it tomorrow and tentatively plan removal next week with temporary PICC line.   They will make decision on when to stop coumadin.       Terriah Reggio 01/29/2013, 12:03 PM

## 2013-01-29 NOTE — Assessment & Plan Note (Signed)
Catheter was placed by IR.   Discussed with Dr. Grace Isaac.  They will evaluate it tomorrow and tentatively plan removal next week with temporary PICC line.   They will make decision on when to stop coumadin.

## 2013-01-29 NOTE — Progress Notes (Deleted)
Chief Complaint  Patient presents with  . Other    urge office/poss PAC infection    HISTORY: ***  Past Medical History  Diagnosis Date  . DVT (deep venous thrombosis) 10/07/2011  . Breast cancer     Past Surgical History  Procedure Laterality Date  . Mastectomy      bilateral  . Breast surgery      Current Outpatient Prescriptions  Medication Sig Dispense Refill  . ALPRAZolam (XANAX) 1 MG tablet Take 1 mg by mouth 3 (three) times daily as needed. For anxiety      . amLODipine (NORVASC) 10 MG tablet Take 10 mg by mouth daily.        . Ascorbic Acid (VITAMIN C) 100 MG tablet Take 100 mg by mouth daily.        Marland Kitchen azithromycin (ZITHROMAX Z-PAK) 250 MG tablet 2 tabs by mouth x 1 day, then 1 tab by mouth daily for a total of 5 days  6 each  0  . calcium-vitamin D (OSCAL WITH D) 500-200 MG-UNIT per tablet Take 1 tablet by mouth daily.        . cephALEXin (KEFLEX) 500 MG capsule Take 1 capsule (500 mg total) by mouth 4 (four) times daily.  40 capsule  0  . gabapentin (NEURONTIN) 100 MG capsule TAKE 1 CAPSULE BY MOUTH AT BEDTIME  30 capsule  3  . hydrochlorothiazide (HYDRODIURIL) 25 MG tablet TAKE 1 TABLET BY MOUTH 3 TIMES A WEEK ON MONDAY, WEDNESDAY, AND FRIDAY  30 tablet  0  . metaxalone (SKELAXIN) 800 MG tablet Take 1 tablet (800 mg total) by mouth 3 (three) times daily.  60 tablet  1  . Multiple Vitamin (MULTIVITAMIN) capsule Take 1 capsule by mouth daily.        Marland Kitchen oxyCODONE-acetaminophen (PERCOCET) 10-325 MG per tablet       . oxymorphone (OPANA) 10 MG tablet       . PRESCRIPTION MEDICATION Pt gets this every 3 weeks at cancer center. Friday 10-05-12. Next treatment is on 10-26-12. Followed by Donnie Coffin      . promethazine (PHENERGAN) 25 MG tablet Take 1 tablet (25 mg total) by mouth every 6 (six) hours as needed for nausea.  30 tablet  0  . promethazine-dextromethorphan (PROMETHAZINE-DM) 6.25-15 MG/5ML syrup Take 5 mLs by mouth 4 (four) times daily as needed for cough.  118 mL  0  .  triamterene-hydrochlorothiazide (DYAZIDE) 37.5-25 MG per capsule Take 1 each (1 capsule total) by mouth every morning.  30 capsule  1  . warfarin (COUMADIN) 10 MG tablet Take 1 tablet (10 mg total) by mouth daily.  30 tablet  12  . warfarin (COUMADIN) 5 MG tablet Take 2 tablets (10 mg total) by mouth daily. 2 tabs of 5 mg to equal 10 mg  30 tablet  12   No current facility-administered medications for this visit.     Allergies  Allergen Reactions  . Hydrocodone-Acetaminophen Itching and Rash     History reviewed. No pertinent family history.   History   Social History  . Marital Status: Single    Spouse Name: N/A    Number of Children: N/A  . Years of Education: N/A   Social History Main Topics  . Smoking status: Never Smoker   . Smokeless tobacco: None  . Alcohol Use: No  . Drug Use: No  . Sexually Active: None   Other Topics Concern  . None   Social History Narrative  . None  REVIEW OF SYSTEMS - PERTINENT POSITIVES ONLY: 12 point review of systems negative other than HPI and PMH except for ***  EXAM: Filed Vitals:   01/24/13 1615  BP: 114/80  Pulse: 96  Temp: 98 F (36.7 C)    Gen:  No acute distress.  Well nourished and well groomed.   Neurological: Alert and oriented to person, place, and time. Coordination normal.  Head: Normocephalic and atraumatic.  Eyes: Conjunctivae are normal. Pupils are equal, round, and reactive to light. No scleral icterus.  Neck: Normal range of motion. Neck supple. No tracheal deviation or thyromegaly present.  Cardiovascular: Normal rate, regular rhythm, normal heart sounds and intact distal pulses.  Exam reveals no gallop and no friction rub.  No murmur heard. Respiratory: Effort normal.  No respiratory distress. No chest wall tenderness. Breath sounds normal.  No wheezes, rales or rhonchi.  GI: Soft. Bowel sounds are normal. The abdomen is soft and nontender.  There is no rebound and no guarding.  Musculoskeletal:  Normal range of motion. Extremities are nontender.  Lymphadenopathy: No cervical, preauricular, postauricular or axillary adenopathy is present Skin: Skin is warm and dry. No rash noted. No diaphoresis. No erythema. No pallor. No clubbing, cyanosis, or edema.   Psychiatric: Normal mood and affect. Behavior is normal. Judgment and thought content normal.    LABORATORY RESULTS: Available labs are reviewed    RADIOLOGY RESULTS: See E-Chart or I-Site for most recent results.  Images and reports are reviewed.    ASSESSMENT AND PLAN: Port or reservoir infection Catheter was placed by IR.   Discussed with Dr. Grace Isaac.  They will evaluate it tomorrow and tentatively plan removal next week with temporary PICC line.   They will make decision on when to stop coumadin.       Maudry Diego MD Surgical Oncology, General and Endocrine Surgery [facility]      Visit Diagnoses: 1. Port or reservoir infection, initial encounter     Primary Care Physician: Billee Cashing, MD

## 2013-01-31 ENCOUNTER — Other Ambulatory Visit: Payer: Self-pay | Admitting: Physician Assistant

## 2013-01-31 ENCOUNTER — Other Ambulatory Visit (HOSPITAL_BASED_OUTPATIENT_CLINIC_OR_DEPARTMENT_OTHER): Payer: Commercial Managed Care - PPO | Admitting: Lab

## 2013-01-31 ENCOUNTER — Ambulatory Visit (HOSPITAL_BASED_OUTPATIENT_CLINIC_OR_DEPARTMENT_OTHER): Payer: Commercial Managed Care - PPO | Admitting: Pharmacist

## 2013-01-31 DIAGNOSIS — I82409 Acute embolism and thrombosis of unspecified deep veins of unspecified lower extremity: Secondary | ICD-10-CM

## 2013-01-31 DIAGNOSIS — Z7901 Long term (current) use of anticoagulants: Secondary | ICD-10-CM

## 2013-01-31 LAB — PROTIME-INR
INR: 1.2 — ABNORMAL LOW (ref 2.00–3.50)
Protime: 14.4 Seconds — ABNORMAL HIGH (ref 10.6–13.4)

## 2013-01-31 LAB — POCT INR: INR: 1.2

## 2013-01-31 NOTE — Patient Instructions (Signed)
Continue 7.5mg  daily. Recheck INR on 4/2; lab at 1:30pm and coumadin clinic at 1:45pm.

## 2013-01-31 NOTE — Progress Notes (Signed)
Pt has only had 2 doses of coumadin 7.5 mg since her port was removed. She will continue her 7.5 mg daily and RTC on 02/06/13 at 1:30. She is requesting pain meds from A. Allyson Sabal.  I informed her that her percocet was prescribed by her PCP.

## 2013-02-05 ENCOUNTER — Ambulatory Visit (HOSPITAL_BASED_OUTPATIENT_CLINIC_OR_DEPARTMENT_OTHER): Payer: Commercial Managed Care - PPO | Admitting: Pharmacist

## 2013-02-05 ENCOUNTER — Encounter: Payer: Self-pay | Admitting: Pharmacist

## 2013-02-05 ENCOUNTER — Other Ambulatory Visit (HOSPITAL_BASED_OUTPATIENT_CLINIC_OR_DEPARTMENT_OTHER): Payer: Commercial Managed Care - PPO | Admitting: Lab

## 2013-02-05 ENCOUNTER — Encounter (HOSPITAL_COMMUNITY)
Admission: RE | Admit: 2013-02-05 | Discharge: 2013-02-05 | Disposition: A | Discharge status: Home / Self Care | Payer: Commercial Managed Care - PPO | Source: Ambulatory Visit | Attending: Physician Assistant | Admitting: Physician Assistant

## 2013-02-05 DIAGNOSIS — C50919 Malignant neoplasm of unspecified site of unspecified female breast: Secondary | ICD-10-CM | POA: Insufficient documentation

## 2013-02-05 DIAGNOSIS — I82409 Acute embolism and thrombosis of unspecified deep veins of unspecified lower extremity: Secondary | ICD-10-CM

## 2013-02-05 LAB — PROTIME-INR
INR: 1.3 — ABNORMAL LOW (ref 2.00–3.50)
Protime: 15.6 Seconds — ABNORMAL HIGH (ref 10.6–13.4)

## 2013-02-05 LAB — POCT INR: INR: 1.3

## 2013-02-05 LAB — GLUCOSE, CAPILLARY: Glucose-Capillary: 95 mg/dL (ref 70–99)

## 2013-02-05 MED ORDER — FLUDEOXYGLUCOSE F - 18 (FDG) INJECTION
17.2000 | Freq: Once | INTRAVENOUS | Status: AC | PRN
  Administered 2013-02-05: 17.2 via INTRAVENOUS

## 2013-02-05 NOTE — Progress Notes (Signed)
INR = 1.3 Pt states she restarted her Coumadin 7.5 mg/day on 3/26 after her port-a-cath was removed. She is very confused.  She doesn't remember the day her port was removed.  She keeps saying "Monday" & when I said 3/24 she says "no." I have instructed pt to continue Coumadin 7.5 mg/day. Recheck INR 02/14/13 when she is here for tx. Ebony Hail, Pharm.D., CPP 02/05/2013@3 :55 PM

## 2013-02-05 NOTE — Progress Notes (Signed)
Pt called at 9:18 am this morning & left voicemail on Coumadin clinic phone to see if she could get an appt today for lab/Coumadin clinic since she was getting a CT at [facility] already today.  She does not have a ride for her appts scheduled here tomorrow. I returned her call at 9:48 am this morning (she was already in the CT) & a lady answered pts cell phone (?pts dtr).  She was aware pt had called & requested lab/CC appt today before I even identified myself over the phone. I was able to fit pt in for 11:00 am lab & 11:15 am CC appt since another pt cancelled. Pt came & was paged to me at 11:37 am while I was in the CC with another high-level/high-urgency pt until 12:30 pm.  [name] left after she had her lab drawn. I called her cell phone again & the same lady answered & said she didn't know where [name] was.  I asked her if she would have pt call our Coumadin clinic so we could discuss her INR result & dosing instructions.  I am still awaiting call back from patient. Pt should be on Coumadin 7.5 mg daily.  I suspect since her INR = 1.3 she is again being non-compliant. I plan to have her take the same dose & have her INR rechecked on 02/14/13 when she is here for treatment.  A pharmacist can see her in the infusion room. Ebony Hail, Pharm.D., CPP 02/05/2013@1 :13 PM

## 2013-02-06 ENCOUNTER — Ambulatory Visit: Payer: Medicare Other

## 2013-02-06 ENCOUNTER — Other Ambulatory Visit: Payer: BC Managed Care – PPO | Admitting: Lab

## 2013-02-14 ENCOUNTER — Ambulatory Visit: Payer: Commercial Managed Care - PPO | Admitting: Pharmacist

## 2013-02-14 ENCOUNTER — Other Ambulatory Visit (HOSPITAL_BASED_OUTPATIENT_CLINIC_OR_DEPARTMENT_OTHER): Payer: Commercial Managed Care - PPO | Admitting: Lab

## 2013-02-14 ENCOUNTER — Ambulatory Visit (HOSPITAL_BASED_OUTPATIENT_CLINIC_OR_DEPARTMENT_OTHER): Payer: Commercial Managed Care - PPO

## 2013-02-14 ENCOUNTER — Telehealth: Payer: Self-pay | Admitting: *Deleted

## 2013-02-14 ENCOUNTER — Other Ambulatory Visit: Payer: Self-pay | Admitting: Oncology

## 2013-02-14 ENCOUNTER — Ambulatory Visit (HOSPITAL_BASED_OUTPATIENT_CLINIC_OR_DEPARTMENT_OTHER): Payer: Commercial Managed Care - PPO | Admitting: Oncology

## 2013-02-14 ENCOUNTER — Telehealth: Payer: Self-pay | Admitting: Oncology

## 2013-02-14 VITALS — BP 148/90 | HR 103 | Temp 98.2°F | Resp 20 | Ht 66.0 in | Wt 183.2 lb

## 2013-02-14 DIAGNOSIS — C50219 Malignant neoplasm of upper-inner quadrant of unspecified female breast: Secondary | ICD-10-CM

## 2013-02-14 DIAGNOSIS — Z7901 Long term (current) use of anticoagulants: Secondary | ICD-10-CM

## 2013-02-14 DIAGNOSIS — I82409 Acute embolism and thrombosis of unspecified deep veins of unspecified lower extremity: Secondary | ICD-10-CM

## 2013-02-14 DIAGNOSIS — Z5112 Encounter for antineoplastic immunotherapy: Secondary | ICD-10-CM

## 2013-02-14 DIAGNOSIS — C773 Secondary and unspecified malignant neoplasm of axilla and upper limb lymph nodes: Secondary | ICD-10-CM

## 2013-02-14 DIAGNOSIS — C50919 Malignant neoplasm of unspecified site of unspecified female breast: Secondary | ICD-10-CM

## 2013-02-14 DIAGNOSIS — O2230 Deep phlebothrombosis in pregnancy, unspecified trimester: Secondary | ICD-10-CM

## 2013-02-14 DIAGNOSIS — I890 Lymphedema, not elsewhere classified: Secondary | ICD-10-CM

## 2013-02-14 LAB — COMPREHENSIVE METABOLIC PANEL
ALT: 28 U/L (ref 0–35)
AST: 59 U/L — ABNORMAL HIGH (ref 0–37)
Albumin: 3.9 g/dL (ref 3.5–5.2)
Alkaline Phosphatase: 124 U/L — ABNORMAL HIGH (ref 39–117)
BUN: 10 mg/dL (ref 6–23)
CO2: 29 mEq/L (ref 19–32)
Calcium: 9.8 mg/dL (ref 8.4–10.5)
Chloride: 97 mEq/L (ref 96–112)
Creatinine, Ser: 0.68 mg/dL (ref 0.50–1.10)
Glucose, Bld: 94 mg/dL (ref 70–99)
Potassium: 3.2 mEq/L — ABNORMAL LOW (ref 3.5–5.3)
Sodium: 137 mEq/L (ref 135–145)
Total Bilirubin: 0.4 mg/dL (ref 0.3–1.2)
Total Protein: 8.8 g/dL — ABNORMAL HIGH (ref 6.0–8.3)

## 2013-02-14 LAB — CBC WITH DIFFERENTIAL/PLATELET
BASO%: 0.4 % (ref 0.0–2.0)
Basophils Absolute: 0 10*3/uL (ref 0.0–0.1)
EOS%: 1.9 % (ref 0.0–7.0)
Eosinophils Absolute: 0.1 10*3/uL (ref 0.0–0.5)
HCT: 41.8 % (ref 34.8–46.6)
HGB: 13.7 g/dL (ref 11.6–15.9)
LYMPH%: 34.8 % (ref 14.0–49.7)
MCH: 27.5 pg (ref 25.1–34.0)
MCHC: 32.8 g/dL (ref 31.5–36.0)
MCV: 83.8 fL (ref 79.5–101.0)
MONO#: 0.5 10*3/uL (ref 0.1–0.9)
MONO%: 9.7 % (ref 0.0–14.0)
NEUT#: 2.8 10*3/uL (ref 1.5–6.5)
NEUT%: 53.2 % (ref 38.4–76.8)
Platelets: 142 10*3/uL — ABNORMAL LOW (ref 145–400)
RBC: 4.99 10*6/uL (ref 3.70–5.45)
RDW: 16.9 % — ABNORMAL HIGH (ref 11.2–14.5)
WBC: 5.2 10*3/uL (ref 3.9–10.3)
lymph#: 1.8 10*3/uL (ref 0.9–3.3)

## 2013-02-14 LAB — PROTIME-INR
INR: 3.7 — ABNORMAL HIGH (ref 2.00–3.50)
Protime: 44.4 Seconds — ABNORMAL HIGH (ref 10.6–13.4)

## 2013-02-14 LAB — POCT INR: INR: 3.7

## 2013-02-14 MED ORDER — SODIUM CHLORIDE 0.9 % IV SOLN
Freq: Once | INTRAVENOUS | Status: AC
  Administered 2013-02-14: 17:00:00 via INTRAVENOUS

## 2013-02-14 MED ORDER — DIPHENHYDRAMINE HCL 25 MG PO CAPS
25.0000 mg | ORAL_CAPSULE | Freq: Once | ORAL | Status: AC
  Administered 2013-02-14: 25 mg via ORAL

## 2013-02-14 MED ORDER — TRAMADOL HCL 50 MG PO TABS: 50.0000 mg | ORAL_TABLET | Freq: Four times a day (QID) | ORAL | Status: DC | PRN

## 2013-02-14 MED ORDER — LORAZEPAM 0.5 MG PO TABS: 0.5000 mg | ORAL_TABLET | Freq: Two times a day (BID) | ORAL | Status: DC | PRN

## 2013-02-14 MED ORDER — ACETAMINOPHEN 325 MG PO TABS
650.0000 mg | ORAL_TABLET | Freq: Once | ORAL | Status: AC
  Administered 2013-02-14: 650 mg via ORAL

## 2013-02-14 MED ORDER — SODIUM CHLORIDE 0.9 % IV SOLN
320.0000 mg | Freq: Once | INTRAVENOUS | Status: AC
  Administered 2013-02-14: 320 mg via INTRAVENOUS
  Filled 2013-02-14: qty 16

## 2013-02-14 NOTE — Telephone Encounter (Signed)
, °

## 2013-02-14 NOTE — Telephone Encounter (Signed)
Per staff phone call and POF I have schedueld appts.  JMW  

## 2013-02-14 NOTE — Telephone Encounter (Signed)
Patient called asking what is genetic counseling.  Asked if something is wrong with her and why was this scheduled.  "I don't have my port-a-cath anymore and do I need to bring my daughter?"  Patient to be seen today and I instructed her to ask MD these questions at today's f/u.

## 2013-02-14 NOTE — Progress Notes (Signed)
ID: [name]   DOB: 09-Jun-1956  MR#: [id number]  WUJ#:[id number]  PCP: Billee Cashing, MD GYN:  SU:  OTHER MD:   HISTORY OF PRESENT ILLNESS: The patient originally presented in May 2003 when she noticed a lump in the upper inner quadrant of her right breast in September 2003.  She sought attention and had a mammogram which showed an obvious carcinoma in the upper inner quadrant of the right breast, approximately 2 cm.  There were some enlarged lymph nodes in the axilla and an FNA done showed those consistent with malignant cells, most likely an invasive ductal carcinoma.  At that point she was unsure of what to do and was referred by Dr. Francina Ames for a discussion of her treatment options.  By biopsy, it was ER/PR negative and HER-2 was 3+.  DNA index was 1.42.  We reiterated that it was most important to have her disease surgically addressed at that point and had recommended lumpectomy and axillary nodes to be addressed with which she followed through and on 04-03-02, Dr. Maple Hudson performed a right partial mastectomy and a right axillary lymph node dissection.  Final pathology revealed a 2.4 cm. high grade, Grade III invasive ductal carcinoma with an adjacent .8 cm. also high grade invasive ductal carcinoma which was felt to represent an intramammary metastasis rather than a second primary.  A smaller mass was just medial to the larger mass.  The smaller mass was associated with high grade DCIS component.  There was no definite lymphovascular invasion identified.  However, one of fourteen axillary lymph nodes did contain a 1.5 cm. metastatic deposit.  Postoperatively she did very well.  We reiterated the fact that she did need adjuvant chemotherapy, however, she refused and decided that she would pursue radiation.  She received radiation and completed that on 08-14-02. Her subsequent history is as detailed below  INTERVAL HISTORY: [name] returns today together with her daughter [name] for followup  of her recurrent breast carcinoma. The interval history is significant for her having developed a port infection leading to port removal 01/28/2013. She continues to receive TDM-1 with good tolerance  REVIEW OF SYSTEMS: She complains of pain in the right arm and tells me Dr. Thea Silversmith recently prescribed some pain medication for her. She denies constipation, also denies fever, rash, or bleeding. There've been no unusual headaches. She complains of anxiety and depression. A detailed review of systems is otherwise stable.    PAST MEDICAL HISTORY: Past Medical History  Diagnosis Date  . DVT (deep venous thrombosis) 10/07/2011  . Breast cancer     PAST SURGICAL HISTORY: Past Surgical History  Procedure Laterality Date  . Mastectomy      bilateral  . Breast surgery      FAMILY HISTORY No family history on file. The patient's father died in his 65 6 from complications of alcohol abuse. The patient's mother died in her 8s from pancreatic cancer. The patient has 6 brothers, 2 sisters. Both her sisters have had breast cancer, both diagnosed after the age of 26. The patient has not had genetic testing so far.  GYNECOLOGIC HISTORY: Menarche age 54; first live birth age 78. She is GXP4. She is not sure when she stopped having periods. She never used hormone replacement.  SOCIAL HISTORY: The patient has worked in the past as a Agricultural engineer. At home in addition to the patient are her husband [name], originally from [place], who works as a Naval architect. Also son [name] age 33 and daughter  [name] age 68 (she is studying to be a Engineer, civil (consulting)). The other 2 children are [name], who has a college degree in psychology and works as a Clinical research associate; and [name], who lives in [place] and works as a Nurse, adult.   ADVANCED DIRECTIVES: Not in place  HEALTH MAINTENANCE: History  Substance Use Topics  . Smoking status: Never Smoker   . Smokeless tobacco: Not on file  . Alcohol Use: No      Colonoscopy:  PAP:  Bone density:  Lipid panel:  Allergies  Allergen Reactions  . Hydrocodone-Acetaminophen Itching and Rash    Current Outpatient Prescriptions  Medication Sig Dispense Refill  . ALPRAZolam (XANAX) 1 MG tablet Take 1 mg by mouth 3 (three) times daily as needed. For anxiety      . amLODipine (NORVASC) 10 MG tablet Take 10 mg by mouth daily.        . Ascorbic Acid (VITAMIN C) 100 MG tablet Take 100 mg by mouth daily.        Marland Kitchen azithromycin (ZITHROMAX Z-PAK) 250 MG tablet 2 tabs by mouth x 1 day, then 1 tab by mouth daily for a total of 5 days  6 each  0  . calcium-vitamin D (OSCAL WITH D) 500-200 MG-UNIT per tablet Take 1 tablet by mouth daily.        . cephALEXin (KEFLEX) 500 MG capsule Take 1 capsule (500 mg total) by mouth 4 (four) times daily.  40 capsule  0  . gabapentin (NEURONTIN) 100 MG capsule TAKE 1 CAPSULE BY MOUTH AT BEDTIME  30 capsule  3  . hydrochlorothiazide (HYDRODIURIL) 25 MG tablet TAKE 1 TABLET BY MOUTH 3 TIMES A WEEK ON MONDAY, WEDNESDAY, AND FRIDAY  30 tablet  0  . metaxalone (SKELAXIN) 800 MG tablet Take 1 tablet (800 mg total) by mouth 3 (three) times daily.  60 tablet  1  . Multiple Vitamin (MULTIVITAMIN) capsule Take 1 capsule by mouth daily.        Marland Kitchen oxyCODONE-acetaminophen (PERCOCET) 10-325 MG per tablet       . oxymorphone (OPANA) 10 MG tablet       . PRESCRIPTION MEDICATION Pt gets this every 3 weeks at cancer center. Friday 10-05-12. Next treatment is on 10-26-12. Followed by Donnie Coffin      . promethazine (PHENERGAN) 25 MG tablet Take 1 tablet (25 mg total) by mouth every 6 (six) hours as needed for nausea.  30 tablet  0  . promethazine-dextromethorphan (PROMETHAZINE-DM) 6.25-15 MG/5ML syrup Take 5 mLs by mouth 4 (four) times daily as needed for cough.  118 mL  0  . triamterene-hydrochlorothiazide (DYAZIDE) 37.5-25 MG per capsule Take 1 each (1 capsule total) by mouth every morning.  30 capsule  1  . warfarin (COUMADIN) 10 MG tablet Take 1  tablet (10 mg total) by mouth daily.  30 tablet  12  . warfarin (COUMADIN) 5 MG tablet Take 2 tablets (10 mg total) by mouth daily. 2 tabs of 5 mg to equal 10 mg  30 tablet  12   No current facility-administered medications for this visit.    OBJECTIVE: Middle-aged [place] American woman in no acute distress Filed Vitals:   02/14/13 1510  BP: 148/90  Pulse: 103  Temp: 98.2 F (36.8 C)  Resp: 20     Body mass index is 29.58 kg/(m^2).    ECOG FS: 1 Filed Weights   02/14/13 1510  Weight: 183 lb 3.2 oz (83.099 kg)   Sclerae unicteric Careful palpation of the right  neck and right supraclavicular area did not reveal a discrete mass Lungs no rales or rhonchi Heart regular rate and rhythm Abdomen nontender, positive bowel sounds MSK no focal spinal tenderness, Minimal bilateral ankle edema; 1+ right upper extremity lymphedema, compression sleeve in place There some small palpable masses consistent with lipomas palpated in the anterior thighs bilaterally Neuro: nonfocal, well oriented Breasts: The right breast is status post mastectomy. There is no evidence of local recurrence. The left breast is s/p mastectomy. No evidence of chest wall recurrence. The left axilla is benign Skin: The area over the port scar on the right shows no evidence of swelling, erythema, or tenderness.   LAB RESULTS: Lab Results  Component Value Date   WBC 5.2 02/14/2013   NEUTROABS 2.8 02/14/2013   HGB 13.7 02/14/2013   HCT 41.8 02/14/2013   MCV 83.8 02/14/2013   PLT 142* 02/14/2013      Chemistry      Component Value Date/Time   NA 139 01/03/2013 1123   NA 140 10/26/2012 1645   K 3.5 01/03/2013 1123   K 3.4* 10/26/2012 1645   CL 102 01/03/2013 1123   CL 101 10/26/2012 1645   CO2 29 01/03/2013 1123   CO2 29 10/26/2012 1645   BUN 14.0 01/03/2013 1123   BUN 12 10/26/2012 1645   CREATININE 0.8 01/03/2013 1123   CREATININE 0.71 10/26/2012 1645      Component Value Date/Time   CALCIUM 9.4 01/03/2013 1123    CALCIUM 10.1 10/26/2012 1645   ALKPHOS 145 01/03/2013 1123   ALKPHOS 102 10/26/2012 1645   AST 64* 01/03/2013 1123   AST 50* 10/26/2012 1645   ALT 26 01/03/2013 1123   ALT 21 10/26/2012 1645   BILITOT 0.76 01/03/2013 1123   BILITOT 0.5 10/26/2012 1645       Lab Results  Component Value Date   LABCA2 37 10/05/2012     Recent Labs Lab 02/14/13 1426  INR 3.70*    STUDIES: Nm Pet Image Restag (ps) Skull Base To Thigh  02/05/2013  *RADIOLOGY REPORT*  Clinical Data: Subsequent treatment strategy for metastatic breast cancer.  NUCLEAR MEDICINE PET SKULL BASE TO THIGH  Fasting Blood Glucose:  95  Technique:  17.2 mCi F-18 FDG was injected intravenously. CT data was obtained and used for attenuation correction and anatomic localization only.  (This was not acquired as a diagnostic CT examination.) Additional exam technical data entered on technologist worksheet.  Comparison:  11/05/2012  Findings:  Neck: Right posterior cervical lymph node has a short axis diameter of 1.5 cm.  There is associated increased FDG uptake associated this lymph node within SUV max equal to 5.  Chest:  Post surgical change in the right chest wall from removal of Port-A-Cath noted with associated increased FDG uptake.  No hypermetabolic axillary or supraclavicular lymph nodes.  No hypermetabolic mediastinal or hilar nodes.  No suspicious pulmonary nodules on the CT scan.  Abdomen/Pelvis:  No abnormal hypermetabolic activity within the liver, pancreas, adrenal glands, or spleen.  No hypermetabolic lymph nodes in the abdomen or pelvis.  Skeleton:  No focal hypermetabolic activity to suggest skeletal metastasis.  IMPRESSION:  1.  No acute findings. 2.  There are no findings highly suspicious for metastatic disease. 3.  There is moderate increased uptake associated with the right posterior cervical lymph node.  The SUV max is equal to five. Cannot rule out lymph node metastasis. If there is a clinical concern for recurrent  metastatic disease consider ultrasound of  the neck or contrast enhanced CT of the soft tissues of the neck.   Original Report Authenticated By: Signa Kell, [name]    Ir Removal Gap Inc W/o Fl Mod Sed  02/01/2013  *RADIOLOGY REPORT*  Clinical Data:  Chronic port catheter (placed over 10 years ago), now with surrounding erythema, concerning for infection  REMOVAL OF IMPLANTED TUNNELED PORT-A-CATH  Intravenous medications: Fentanyl 100 mcg IV; Versed 2 mg IV; Ancef 2 gm IV; The antibiotic was administered within 1 hour prior to the start of the procedure.  Sedation time: 18 minutes  Fluoroscopy time: None  Complications: None immediate  Findings / Procedure:  Informed written consent  Informed written consent was obtained from the patient after a discussion of the risk, benefits and alternatives to the procedure. The patient was positioned supine on the fluoroscopy table and the right chest Port-A-Cath site was prepped with chlorhexidine.  A sterile gown and gloves were worn during the procedure.  Local anesthesia was provided with 1% lidocaine with epinephrine.  A timeout was performed prior to the initiation of the procedure.  An incision was made overlying the Port-A-Cath with a #15 scalpel. Utilizing sharp and blunt dissection, the Port-A-Cath was removed completely.  The pocked was irrigated with sterile saline. Gross inspection of the port catheter pocket was negative for obvious sign of infection.  As such, wound closure was performed with subcutaneous interrupted 2-0 Vicryl and Steri-Strips.  A dressing was placed.  The patient tolerated the procedure well without immediate post procedural complication.  IMPRESSION:  Successful removal of implanted Port-A-Cath.   Original Report Authenticated By: Tacey Ruiz, MD    Most recent echocardiogram on 12/11/2012 showed an ejection fraction of 55-60%.   ASSESSMENT: 57 y.o. [place] woman with stage IV breast cancer manifested chiefly by  loco-regional nodal disease (neck, chest), without lung, liver, bone, or brain involvement.  (1) status post right lumpectomy and sentinel lymph node sampling 03/04/2002 for 2 separate foci of invasive ductal carcinoma, mpT2 pN1 or stage IIB, both foci grade 3, both estrogen and progesterone receptor positive, both HercepTest 3+, Mib-1 56%  (2) reexcision for margins 05/27/2002 showed no residual cancer in the breast  (3) the patient refused adjuvant systemic therapy  (4) adjuvant radiation treatment completed 08/14/2002  (5) diffuse recurrence in the right breast 02/24/2004 showing a morphologically different tumor, again grade 3, again estrogen and progesterone receptor negative, with an MIB-1 of 14% and Herceptest 3+  (5) between May and September 2005 received dose dense doxorubicin/cyclophosphamide x4 given with Herceptin, followed by weekly docetaxel x8, again given with Herceptin  (6) right mastectomy 07/13/2004 showed scattered microscopic foci of residual disease over an area of greater than 5 cm. Margins were negative.  (7) postoperative docetaxel continued to November of 2005  (8) trastuzumab/Herceptin given October 2005 through March 2013 with some brief interruptions  (9) isolated right cervical nodal recurrence December of 2006 treated with radiation to the right supraclavicular area (total 41.5 gray) completed 12/29/2005  (10) navelbine given together with Herceptin  January through May 2007  (9) left mastectomy 02/13/2006 for ductal carcinoma in situ, grade 2, estrogen and progesterone receptor negative, with negative margins; 0 of 3 lymph nodes involved  (10) the patient treated with lapatinib and capecitabine before December 2010, for an unclear duration and with unclear results (cannot locate data on chart review)  (11) status post right supraclavicular lymph node biopsy December 2011 again positive for an invasive ductal carcinoma, estrogen and progesterone receptor  negative, HER-2 positive by [facility] with a ratio 4.25.  (12) navelbine given together with Herceptin between July 2012 and January 2013.  (13) carboplatin/gemcitabine/Herceptin given for 2 cycles, February and March 2013  (14) TDM-1 Daiva Huge) started April 2013. Most recent echo on 12/11/2012 showed a well preserved ejection fraction  (15) deep vein thrombosis of the right upper extremity documented 04/20/2011  (16) chronic right upper extremity lymphedema   PLAN: Gage is doing quite well given her history of stage IV breast cancer. She is tolerating the TDM-1 with no evidence of cardiac toxicity. The plan is to continue TDM 1 every 3 weeks until he have clear evidence of disease progression. She will need an echocardiogram in May.  Now that her right chest wall port has been discontinued, I think after two additional months of Coumadin we can discontinue anticoagulation. I would propose stopping the Coumadin at her 03/28/2013 visit. She can then have a port placed in the left chest wall area late May.  The only "hot" area on PET scan is a 1.5 centimeter right posterior cervical triangle nodule (SUV max 5), which I could not palpate today. Marker at understands I am not comfortable prescribing narcotics for her, and she also requested alprazolam, a drug that I do not like to prescribe because of concerns regarding withdrawal issues. After much discussion I did write her for tramadol and lorazepam. She will let us know how these work for her.  [name],GUSTAV C    02/14/2013

## 2013-02-14 NOTE — Progress Notes (Signed)
INR = 3.7 on Coumadin 7.5 mg daily Pt has not missed any doses of Coumadin. Had a nose bleed last night during shower.  Minor.  Lasted only a few minutes. INR is just slightly over goal.  I will not change her dose of Coumadin.  This is the closest she has been to goal in a while. Return in 2 weeks for protime check. Ebony Hail, Pharm.D., CPP 02/14/2013@3 :03 PM

## 2013-02-14 NOTE — Patient Instructions (Addendum)
[facility] Discharge Instructions for Patients Receiving Chemotherapy  Today you received the following chemotherapy agents Kadcyla. To help prevent nausea and vomiting after your treatment, we encourage you to take your nausea medication as prescribed.    If you develop nausea and vomiting that is not controlled by your nausea medication, call the clinic. If it is after clinic hours your family physician or the after hours number for the clinic or go to the Emergency Department.   BELOW ARE SYMPTOMS THAT SHOULD BE REPORTED IMMEDIATELY:  *FEVER GREATER THAN 100.5 F  *CHILLS WITH OR WITHOUT FEVER  NAUSEA AND VOMITING THAT IS NOT CONTROLLED WITH YOUR NAUSEA MEDICATION  *UNUSUAL SHORTNESS OF BREATH  *UNUSUAL BRUISING OR BLEEDING  TENDERNESS IN MOUTH AND THROAT WITH OR WITHOUT PRESENCE OF ULCERS  *URINARY PROBLEMS  *BOWEL PROBLEMS  UNUSUAL RASH Items with * indicate a potential emergency and should be followed up as soon as possible.  Please let the nurse know about any problems that you may have experienced. Feel free to call the clinic you have any questions or concerns. The clinic phone number is [phone].   I have been informed and understand all the instructions given to me. I know to contact the clinic, my physician, or go to the Emergency Department if any problems should occur. I do not have any questions at this time, but understand that I may call the clinic during office hours   should I have any questions or need assistance in obtaining follow up care.    __________________________________________  _____________  __________ Signature of Patient or Authorized Representative            Date                   Time    __________________________________________ Nurse's Signature

## 2013-02-18 ENCOUNTER — Other Ambulatory Visit: Payer: Commercial Managed Care - PPO | Admitting: Lab

## 2013-02-18 ENCOUNTER — Ambulatory Visit (HOSPITAL_BASED_OUTPATIENT_CLINIC_OR_DEPARTMENT_OTHER): Payer: Commercial Managed Care - PPO | Admitting: Genetic Counselor

## 2013-02-18 ENCOUNTER — Telehealth: Payer: Self-pay | Admitting: Pharmacist

## 2013-02-18 DIAGNOSIS — Z803 Family history of malignant neoplasm of breast: Secondary | ICD-10-CM

## 2013-02-18 DIAGNOSIS — C50219 Malignant neoplasm of upper-inner quadrant of unspecified female breast: Secondary | ICD-10-CM

## 2013-02-18 DIAGNOSIS — IMO0002 Reserved for concepts with insufficient information to code with codable children: Secondary | ICD-10-CM

## 2013-02-18 DIAGNOSIS — C50919 Malignant neoplasm of unspecified site of unspecified female breast: Secondary | ICD-10-CM

## 2013-02-18 DIAGNOSIS — Z8051 Family history of malignant neoplasm of kidney: Secondary | ICD-10-CM

## 2013-02-18 NOTE — Telephone Encounter (Signed)
Pt called to report her nose was bleeding uncontrollably.  LH contacted [name] who instructed her to hold her coumadin for 2 days.  If the bleeding continued thru the day she was instructed to call us back or go to ER. She initially did not want to go anywhere bc she would get blood on her clothes.  Never heard from patient this day.

## 2013-02-19 ENCOUNTER — Telehealth: Payer: Self-pay | Admitting: Pharmacist

## 2013-02-19 DIAGNOSIS — I82409 Acute embolism and thrombosis of unspecified deep veins of unspecified lower extremity: Secondary | ICD-10-CM

## 2013-02-20 ENCOUNTER — Encounter: Payer: Self-pay | Admitting: Genetic Counselor

## 2013-02-20 NOTE — Progress Notes (Signed)
Dr.  Raymond Gurney [name] requested a consultation for genetic counseling and risk assessment for [name], a 57 y.o. female, for discussion of her personal history of bilateral breast cancer and family history of breast, kidney and pancreatic cancer. She presents to clinic today to discuss the possibility of a genetic predisposition to cancer, and to further clarify her risks, as well as her family members' risks for cancer.   HISTORY OF PRESENT ILLNESS: In 2014, at the age of 65, [name] was diagnosed with breast cancer.  [name] had previously been diagnosed at the age of 72 with breast cancer on her right side.    Past Medical History  Diagnosis Date  . DVT (deep venous thrombosis) 10/07/2011  . Breast cancer     Past Surgical History  Procedure Laterality Date  . Mastectomy      bilateral  . Breast surgery      History  Substance Use Topics  . Smoking status: Never Smoker   . Smokeless tobacco: Not on file  . Alcohol Use: No    REPRODUCTIVE HISTORY AND PERSONAL RISK ASSESSMENT FACTORS: Menarche was at age 21.   Menopause at 45 Uterus Intact: Yes Ovaries Intact: Yes [id number] , first live birth at age 68  She has not previously undergone treatment for infertility.   OCP use is unknown   She has not used HRT in the past.    FAMILY HISTORY:  We obtained a detailed, 4-generation family history.  Significant diagnoses are listed below: Family History  Problem Relation Age of Onset  . Pancreatic cancer Mother   . Kidney cancer Sister 97  . Breast cancer Sister 78  . Breast cancer Other 55  The patient reports that her sister had kidney cancer at 73, breast cancer in anotehr sister at age 68 and pancreatic cancer in her mother.  Patient's maternal ancestors are of african American descent, and paternal ancestors are of African American descent. There is no reported Ashkenazi Jewish ancestry. There is no  known consanguinity.  GENETIC COUNSELING RISK ASSESSMENT,  DISCUSSION, AND SUGGESTED FOLLOW UP: We started to discuss the pros and cons of testing along with the natural history of breast cancer and features that suggest a hereditary cancer syndrome.  The patient declined testing outright, stating that she was cured, and that she did not want to be cut open.  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  She restated that she felt that she was cured and that God did not want her to get cancer again.  The patient declined testing.  The patient was seen for a total of 30 minutes, greater than 50% of which was spent face-to-face counseling.  This plan is being carried out per Dr. Ruthann Cancer recommendations.  This note will also be sent to the referring provider via the electronic medical record. The patient will be supplied with a summary of this genetic counseling discussion as well as educational information on the discussed hereditary cancer syndromes following the conclusion of their visit.   Patient was discussed with Dr. Ruthann Cancer in Dr. Feliz Beam absence..     _______________________________________________________________________ For Office Staff:  Number of people involved in session: 2 Was an  Intern/ student involved with case: no }

## 2013-02-20 NOTE — Telephone Encounter (Signed)
Pt will resume coumadin 7.5mg  daily on 02/20/13

## 2013-02-20 NOTE — Telephone Encounter (Signed)
Pt called to let us know that her nosebleed stopped on 4/14. Nosebleed had not returned on 4/15. She held her coumadin as instructed by Dr. Darnelle Catalan on 4/14 and will hold her coumadin on 4/15. She will return for repeat INR on 02/28/13

## 2013-02-26 ENCOUNTER — Other Ambulatory Visit: Payer: Self-pay | Admitting: Oncology

## 2013-02-26 DIAGNOSIS — C50919 Malignant neoplasm of unspecified site of unspecified female breast: Secondary | ICD-10-CM

## 2013-02-28 ENCOUNTER — Other Ambulatory Visit (HOSPITAL_BASED_OUTPATIENT_CLINIC_OR_DEPARTMENT_OTHER): Payer: Commercial Managed Care - PPO | Admitting: Lab

## 2013-02-28 ENCOUNTER — Ambulatory Visit (HOSPITAL_BASED_OUTPATIENT_CLINIC_OR_DEPARTMENT_OTHER): Payer: Commercial Managed Care - PPO | Admitting: Pharmacist

## 2013-02-28 DIAGNOSIS — I82409 Acute embolism and thrombosis of unspecified deep veins of unspecified lower extremity: Secondary | ICD-10-CM

## 2013-02-28 DIAGNOSIS — O2230 Deep phlebothrombosis in pregnancy, unspecified trimester: Secondary | ICD-10-CM

## 2013-02-28 LAB — PROTIME-INR
INR: 1.3 — ABNORMAL LOW (ref 2.00–3.50)
Protime: 15.6 Seconds — ABNORMAL HIGH (ref 10.6–13.4)

## 2013-02-28 LAB — POCT INR: INR: 1.3

## 2013-02-28 NOTE — Patient Instructions (Addendum)
INR below goal  No changes at this time  We will see you next week in the infusion area on Thursday 03/07/13  Continue same dose of 7.5 mg daily

## 2013-02-28 NOTE — Progress Notes (Signed)
INR below goal again today at 1.3. Despite patient starting back on coumadin last week on 4/16. Pt reports that she is having nose bleeds still and that they are minor but do last for several minutes resulting in her having to lay down. She is concerned about her blood being "too thin" I instructed her that her blood is on the "thicker" side today with her low INR and the importance of being compliant with her coumadin dosing as she stated she may have skipped a dose due to her nose bleedings. Patient stated she may discuss with her MD next month about stopping the coumadin due to her frustrations and concerns with her nose bleeds. The plan for now is to continue coumadin 7.5 mg daily (due to recent nose bleeds and potential for missed doses) and patient will be seen in infusion area next week with infusion appointment. Patient comes in for lab at 1:15pm and infusion appointment at 1:45pm and pharmacy will see patient in infusion area for evaluation.

## 2013-03-04 ENCOUNTER — Other Ambulatory Visit: Payer: Self-pay | Admitting: Oncology

## 2013-03-04 DIAGNOSIS — C50919 Malignant neoplasm of unspecified site of unspecified female breast: Secondary | ICD-10-CM

## 2013-03-07 ENCOUNTER — Ambulatory Visit (HOSPITAL_BASED_OUTPATIENT_CLINIC_OR_DEPARTMENT_OTHER): Payer: Commercial Managed Care - PPO

## 2013-03-07 ENCOUNTER — Ambulatory Visit (HOSPITAL_BASED_OUTPATIENT_CLINIC_OR_DEPARTMENT_OTHER): Payer: Commercial Managed Care - PPO | Admitting: Pharmacist

## 2013-03-07 ENCOUNTER — Other Ambulatory Visit (HOSPITAL_BASED_OUTPATIENT_CLINIC_OR_DEPARTMENT_OTHER): Payer: Commercial Managed Care - PPO | Admitting: Lab

## 2013-03-07 VITALS — BP 121/60 | HR 81 | Temp 98.5°F

## 2013-03-07 DIAGNOSIS — O2230 Deep phlebothrombosis in pregnancy, unspecified trimester: Secondary | ICD-10-CM

## 2013-03-07 DIAGNOSIS — I82409 Acute embolism and thrombosis of unspecified deep veins of unspecified lower extremity: Secondary | ICD-10-CM

## 2013-03-07 DIAGNOSIS — C50219 Malignant neoplasm of upper-inner quadrant of unspecified female breast: Secondary | ICD-10-CM

## 2013-03-07 DIAGNOSIS — Z5112 Encounter for antineoplastic immunotherapy: Secondary | ICD-10-CM

## 2013-03-07 DIAGNOSIS — C773 Secondary and unspecified malignant neoplasm of axilla and upper limb lymph nodes: Secondary | ICD-10-CM

## 2013-03-07 DIAGNOSIS — C50919 Malignant neoplasm of unspecified site of unspecified female breast: Secondary | ICD-10-CM

## 2013-03-07 LAB — COMPREHENSIVE METABOLIC PANEL (CC13)
ALT: 31 U/L (ref 0–55)
AST: 64 U/L — ABNORMAL HIGH (ref 5–34)
Albumin: 3.4 g/dL — ABNORMAL LOW (ref 3.5–5.0)
Alkaline Phosphatase: 155 U/L — ABNORMAL HIGH (ref 40–150)
BUN: 11.9 mg/dL (ref 7.0–26.0)
CO2: 30 mEq/L — ABNORMAL HIGH (ref 22–29)
Calcium: 9.9 mg/dL (ref 8.4–10.4)
Chloride: 101 mEq/L (ref 98–107)
Creatinine: 0.8 mg/dL (ref 0.6–1.1)
Glucose: 92 mg/dl (ref 70–99)
Potassium: 3.5 mEq/L (ref 3.5–5.1)
Sodium: 139 mEq/L (ref 136–145)
Total Bilirubin: 0.56 mg/dL (ref 0.20–1.20)
Total Protein: 8.3 g/dL (ref 6.4–8.3)

## 2013-03-07 LAB — CBC WITH DIFFERENTIAL/PLATELET
BASO%: 1.1 % (ref 0.0–2.0)
Basophils Absolute: 0.1 10*3/uL (ref 0.0–0.1)
EOS%: 2.8 % (ref 0.0–7.0)
Eosinophils Absolute: 0.2 10*3/uL (ref 0.0–0.5)
HCT: 40.5 % (ref 34.8–46.6)
HGB: 13.3 g/dL (ref 11.6–15.9)
LYMPH%: 30.1 % (ref 14.0–49.7)
MCH: 27.7 pg (ref 25.1–34.0)
MCHC: 32.8 g/dL (ref 31.5–36.0)
MCV: 84.2 fL (ref 79.5–101.0)
MONO#: 1 10*3/uL — ABNORMAL HIGH (ref 0.1–0.9)
MONO%: 15.6 % — ABNORMAL HIGH (ref 0.0–14.0)
NEUT#: 3.3 10*3/uL (ref 1.5–6.5)
NEUT%: 50.4 % (ref 38.4–76.8)
Platelets: 148 10*3/uL (ref 145–400)
RBC: 4.81 10*6/uL (ref 3.70–5.45)
RDW: 17.1 % — ABNORMAL HIGH (ref 11.2–14.5)
WBC: 6.5 10*3/uL (ref 3.9–10.3)
lymph#: 2 10*3/uL (ref 0.9–3.3)
nRBC: 0 % (ref 0–0)

## 2013-03-07 LAB — PROTIME-INR
INR: 2.2 (ref 2.00–3.50)
Protime: 26.4 Seconds — ABNORMAL HIGH (ref 10.6–13.4)

## 2013-03-07 LAB — POCT INR: INR: 2.2

## 2013-03-07 MED ORDER — SODIUM CHLORIDE 0.9 % IV SOLN
320.0000 mg | Freq: Once | INTRAVENOUS | Status: AC
  Administered 2013-03-07: 320 mg via INTRAVENOUS
  Filled 2013-03-07: qty 16

## 2013-03-07 MED ORDER — SODIUM CHLORIDE 0.9 % IV SOLN
Freq: Once | INTRAVENOUS | Status: AC
  Administered 2013-03-07: 15:00:00 via INTRAVENOUS

## 2013-03-07 MED ORDER — ACETAMINOPHEN 325 MG PO TABS
650.0000 mg | ORAL_TABLET | Freq: Once | ORAL | Status: AC
  Administered 2013-03-07: 650 mg via ORAL

## 2013-03-07 MED ORDER — DIPHENHYDRAMINE HCL 25 MG PO CAPS
25.0000 mg | ORAL_CAPSULE | Freq: Once | ORAL | Status: AC
  Administered 2013-03-07: 25 mg via ORAL

## 2013-03-07 NOTE — Patient Instructions (Signed)
[facility] Discharge Instructions for Patients Receiving Chemotherapy  Today you received the following chemotherapy agents kadcyla  To help prevent nausea and vomiting after your treatment, we encourage you to take your nausea medication as needed   If you develop nausea and vomiting that is not controlled by your nausea medication, call the clinic. If it is after clinic hours your family physician or the after hours number for the clinic or go to the Emergency Department.   BELOW ARE SYMPTOMS THAT SHOULD BE REPORTED IMMEDIATELY:  *FEVER GREATER THAN 100.5 F  *CHILLS WITH OR WITHOUT FEVER  NAUSEA AND VOMITING THAT IS NOT CONTROLLED WITH YOUR NAUSEA MEDICATION  *UNUSUAL SHORTNESS OF BREATH  *UNUSUAL BRUISING OR BLEEDING  TENDERNESS IN MOUTH AND THROAT WITH OR WITHOUT PRESENCE OF ULCERS  *URINARY PROBLEMS  *BOWEL PROBLEMS  UNUSUAL RASH Items with * indicate a potential emergency and should be followed up as soon as possible.   The clinic phone number is [phone].   I have been informed and understand all the instructions given to me. I know to contact the clinic, my physician, or go to the Emergency Department if any problems should occur. I do not have any questions at this time, but understand that I may call the clinic during office hours   should I have any questions or need assistance in obtaining follow up care.    __________________________________________  _____________  __________ Signature of Patient or Authorized Representative            Date                   Time    __________________________________________ Nurse's Signature

## 2013-03-07 NOTE — Patient Instructions (Signed)
Continue 7.5mg  daily.  Recheck INR on 03/28/13; 2:30 pm for lab, 3pm for appt with Adela Lank, 4:15pm for infusion appointment and 4:30pm for coumadin clinic.  The pharmacist will see you in the infusion area.

## 2013-03-07 NOTE — Progress Notes (Signed)
Pt does not want to stay for 30 min post observation after her kadcyla infusion is completed.  SLJ

## 2013-03-07 NOTE — Progress Notes (Addendum)
INR within goal today. Only one minor nosebleed in the last week. Very small per pt report. No other problems regarding anticoagulation. No missed doses in the last week. If pt has a good nosebleed, she won't take her coumadin that day. No changes in diet, medications,etc. Will f/u coumadin stop date with Dr. Darnelle [name]. Continue 7.5mg  daily.  Recheck INR on 03/28/13; 2:30 pm for lab, 3pm for appt with [name], 4:15pm for infusion appointment and 4:30pm for coumadin clinic.  The pharmacist will see you in the infusion area.  Samples provided: Coumadin 7.5mg  tablets (x 20 tablets provided)                                 Lot: [id number], Exp: 5/15

## 2013-03-21 IMAGING — CT NM PET TUM IMG RESTAG (PS) SKULL BASE T - THIGH
1 of 5 series · 2 of 25 positions shown · IV contrast (350 OM)
Comparison: 12/02/2011

CLINICAL DATA: Subsequent treatment strategy for breast cancer.

NUCLEAR MEDICINE PET SKULL BASE TO THIGH
Fasting Blood Glucose:  100
TECHNIQUE: 18.2 mCi F-18 FDG was injected intravenously. CT data
was obtained and used for attenuation correction and anatomic
localization only.  (This was not acquired as a diagnostic CT
examination.) Additional exam technical data entered on
technologist worksheet.

[Series 2: ct images · axial · 3.8mm · 0.98mm/px · z∈[-114,-10]mm · 2 of 223 slices shown]
[im 191/223  brain]
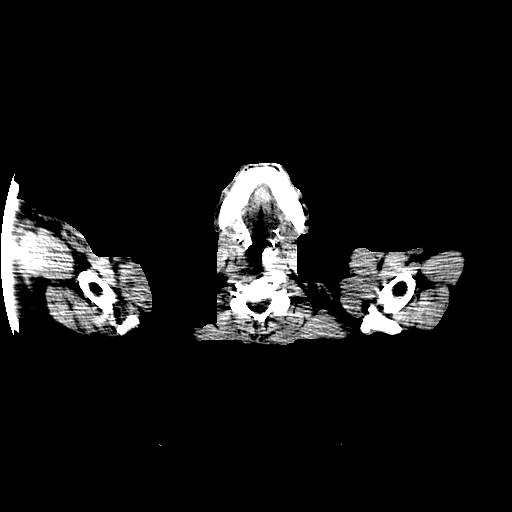
[im 223/223  brain]
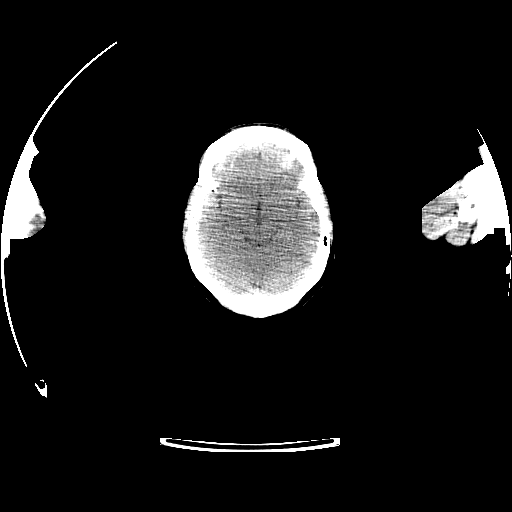

[2 of 25 positions shown; findings below may reference images not displayed]

FINDINGS: Neck:  Significant interval improvement and bilateral cervical
adenopathy.  There is a residual right posterior cervical lymph
node which measures 1.1 cm and has an SUV max equal to 5.30, image
22.

Chest:  There has been interval resolution of right chest wall
tumor and right axillary lymph node metastases.  Hypermetabolic
tumor within the subcutaneous fat of the posterior chest has also
resolved.

No hypermetabolic mediastinal or hilar lymph nodes identified.  No
pericardial or pleural effusion identified.  Changes of external
beam radiation are identified within the subpleural anterior right
upper lobe.

No hypermetabolic mediastinal or hilar nodes.  No suspicious
pulmonary nodules on the CT scan. New pulmonary nodule or density
within the right lower lobe is identified, image 82.  No
significant FDG uptake is associated this structure.

Abdomen/Pelvis:  No abnormal hypermetabolic activity within the
liver, pancreas, adrenal glands, or spleen.  No hypermetabolic
lymph nodes in the abdomen or pelvis.

Skeleton:  No focal hypermetabolic activity to suggest skeletal
metastasis.
IMPRESSION: 1.  Significant interval response to therapy.  There has been
resolution of hypermetabolic right anterior and posterior chest
wall tumor as well as a bilateral supraclavicular and right
axillary hypermetabolic adenopathy.
2.  Hypermetabolic cervical adenopathy has significantly improved
in the interval. Only a solitary right posterior cervical lymph
node remains which exhibits malignant FDG uptake.
3.  No evidence for hypermetabolic tumor below the diaphragm.

## 2013-03-27 ENCOUNTER — Telehealth: Payer: Self-pay | Admitting: *Deleted

## 2013-03-27 NOTE — Telephone Encounter (Signed)
Pt called wanting to cancel all her appts for 03/28/13 and to r/s for next week. i explained to her that i could not cancel her tx  And that i needed a pof for that. [name] is working on getting KM to place an order to cancel her tx for 5/22. gv appt for 04/04/13 @ 12:30pm.the patient is aware...td

## 2013-03-27 NOTE — Telephone Encounter (Signed)
Call sent to Triage.  Patient calling in to cancel all appts tomorrow, states she has to go out of town for a family reunion and leaving in the morning. Wants to r/s lab/md/infusion to next week. Will notify MD to change treatment plan and orders so new appts can be established.

## 2013-03-28 ENCOUNTER — Other Ambulatory Visit: Payer: Self-pay | Admitting: Oncology

## 2013-03-28 ENCOUNTER — Ambulatory Visit: Payer: Commercial Managed Care - PPO

## 2013-03-28 ENCOUNTER — Ambulatory Visit: Payer: Commercial Managed Care - PPO | Admitting: Family

## 2013-03-28 ENCOUNTER — Other Ambulatory Visit: Payer: Commercial Managed Care - PPO | Admitting: Lab

## 2013-03-29 ENCOUNTER — Other Ambulatory Visit: Payer: Self-pay | Admitting: *Deleted

## 2013-03-29 ENCOUNTER — Telehealth: Payer: Self-pay | Admitting: Pharmacist

## 2013-03-29 ENCOUNTER — Telehealth: Payer: Self-pay | Admitting: *Deleted

## 2013-03-29 DIAGNOSIS — C50919 Malignant neoplasm of unspecified site of unspecified female breast: Secondary | ICD-10-CM

## 2013-03-29 DIAGNOSIS — I82409 Acute embolism and thrombosis of unspecified deep veins of unspecified lower extremity: Secondary | ICD-10-CM

## 2013-03-29 MED ORDER — ALPRAZOLAM 0.5 MG PO TABS: ORAL_TABLET | ORAL | Status: DC

## 2013-03-29 MED ORDER — LORAZEPAM 0.5 MG PO TABS: 0.5000 mg | ORAL_TABLET | Freq: Two times a day (BID) | ORAL | Status: DC | PRN

## 2013-03-29 NOTE — Telephone Encounter (Signed)
i emailed MW to scheduled tx for 04/04/13. Once the tx is scheduled i will call and make the pt aware...td

## 2013-03-29 NOTE — Telephone Encounter (Signed)
  Per communication with Dr. Darnelle Catalan, it is ok for pt to discontinue coumadin on 5/22. Spoke with patient by phone. She stated that she had already stopped taking coumadin on 5/19. Standing PT/INR orders have been discontinued. Pt archived in Dose Response.

## 2013-04-04 ENCOUNTER — Telehealth: Payer: Self-pay | Admitting: Oncology

## 2013-04-04 ENCOUNTER — Ambulatory Visit (HOSPITAL_BASED_OUTPATIENT_CLINIC_OR_DEPARTMENT_OTHER): Payer: Commercial Managed Care - PPO | Admitting: Family

## 2013-04-04 ENCOUNTER — Other Ambulatory Visit (HOSPITAL_BASED_OUTPATIENT_CLINIC_OR_DEPARTMENT_OTHER): Payer: Commercial Managed Care - PPO | Admitting: Lab

## 2013-04-04 ENCOUNTER — Encounter: Payer: Self-pay | Admitting: Family

## 2013-04-04 VITALS — BP 164/77 | HR 88 | Temp 97.8°F | Resp 20 | Ht 66.0 in | Wt 179.9 lb

## 2013-04-04 DIAGNOSIS — C50919 Malignant neoplasm of unspecified site of unspecified female breast: Secondary | ICD-10-CM

## 2013-04-04 LAB — CBC WITH DIFFERENTIAL/PLATELET
BASO%: 0.9 % (ref 0.0–2.0)
Basophils Absolute: 0.1 10*3/uL (ref 0.0–0.1)
EOS%: 3.8 % (ref 0.0–7.0)
Eosinophils Absolute: 0.2 10*3/uL (ref 0.0–0.5)
HCT: 43 % (ref 34.8–46.6)
HGB: 14.1 g/dL (ref 11.6–15.9)
LYMPH%: 33.7 % (ref 14.0–49.7)
MCH: 27.4 pg (ref 25.1–34.0)
MCHC: 32.8 g/dL (ref 31.5–36.0)
MCV: 83.7 fL (ref 79.5–101.0)
MONO#: 0.6 10*3/uL (ref 0.1–0.9)
MONO%: 10.5 % (ref 0.0–14.0)
NEUT#: 2.9 10*3/uL (ref 1.5–6.5)
NEUT%: 51.1 % (ref 38.4–76.8)
Platelets: 103 10*3/uL — ABNORMAL LOW (ref 145–400)
RBC: 5.14 10*6/uL (ref 3.70–5.45)
RDW: 17.6 % — ABNORMAL HIGH (ref 11.2–14.5)
WBC: 5.7 10*3/uL (ref 3.9–10.3)
lymph#: 1.9 10*3/uL (ref 0.9–3.3)
nRBC: 0 % (ref 0–0)

## 2013-04-04 MED ORDER — PROMETHAZINE-DM 6.25-15 MG/5ML PO SYRP: 5.0000 mL | ORAL_SOLUTION | Freq: Four times a day (QID) | ORAL | Status: DC | PRN

## 2013-04-04 NOTE — Patient Instructions (Addendum)
Please contact us at [phone] if you have any questions or concerns.  Please continue to do well and enjoy life!!!  Get plenty of rest, drink plenty of water, exercise daily (walking), eat a balanced diet.

## 2013-04-04 NOTE — Telephone Encounter (Signed)
gv pt appt schedule for June and July. S/w Victorino Dike in IR and pt scheduled for port 6/2 to arrive @ 7:30 am. Pt has date/arrival time and instructions. lmonvm for Shantel [facility] the [facility] center requesting appt for echo/Dr. [name] ASAP. Pt aware she will be contacted w/echo/Dr. Gala Romney appt.

## 2013-04-04 NOTE — Progress Notes (Signed)
ID: [name]   DOB: [birth year]  MR#: [id number]  VHQ#:[id number]  PCP: [name] Cashing, MD GYN:  [name] RiggersRose Phi. [name], [name]/[name], [name] RAD UXL:[id number] [name], [name]   HISTORY OF PRESENT ILLNESS: The patient originally presented in May 2003 when she noticed a lump in the upper inner quadrant of her right breast in September 2003.  She sought attention and had a mammogram which showed an obvious carcinoma in the upper inner quadrant of the right breast, approximately 2 cm.  There were some enlarged lymph nodes in the axilla and an FNA done showed those consistent with malignant cells, most likely an invasive ductal carcinoma.  At that point she was unsure of what to do and was referred by [name] for a discussion of her treatment options.  By biopsy, it was ER/PR negative and HER-2 was 3+.  DNA index was 1.42.  We reiterated that it was most important to have her disease surgically addressed at that point and had recommended lumpectomy and axillary nodes to be addressed with which she followed through and on 04-03-02, Dr. Maple [name] performed a right partial mastectomy and a right axillary lymph node dissection.  Final pathology revealed a 2.4 cm. high grade, Grade III invasive ductal carcinoma with an adjacent .8 cm. also high grade invasive ductal carcinoma which was felt to represent an intramammary metastasis rather than a second primary.  A smaller mass was just medial to the larger mass.  The smaller mass was associated with high grade DCIS component.  There was no definite lymphovascular invasion identified.  However, one of fourteen axillary lymph nodes did contain a 1.5 cm. metastatic deposit.  Postoperatively she did very well.  We reiterated the fact that she did need adjuvant chemotherapy, however, she refused and decided that she would pursue radiation.  She received radiation and completed that on 08-14-02. Her subsequent history is as detailed below.  INTERVAL HISTORY: [name] returns today together with her daughter [name] for follow up of her recurrent breast carcinoma. The interval history is significant for having ongoing cervical pain and stopping Coumadin therapy on 03/25/2013. She continues to receive TDM-1 with good tolerance. Her last dose was on 03/07/2013 and is scheduled for every 3 weeks.  Current dose of TDM-1 is being held until patient receives another Port-A-Cath and 2-D echo clearance from Dr. Gala [name].  We also discussed smoking cessation.  REVIEW OF SYSTEMS: Dr. Thea [name] recently prescribed Opana and Percocet pain medication for her.  She also states that she uses Advil with some relief from her cervical pain.  She denies constipation, also denies fever, rash, or bleeding. She has not experienced any unusual headaches. She complains of anxiety and depression. A detailed review of systems is otherwise stable.    PAST MEDICAL HISTORY: Past Medical History  Diagnosis Date  . DVT (deep venous thrombosis) 10/07/2011  . Breast cancer   . Hypertension   . Rheumatic fever     PAST SURGICAL HISTORY: Past Surgical History  Procedure Laterality Date  . Mastectomy      bilateral  . Breast surgery      FAMILY HISTORY Family History  Problem Relation Age of Onset  . Pancreatic cancer Mother   . Kidney cancer Sister 63  . Breast cancer Sister 55  . Breast cancer Other 99  The patient's father died in his 63 6 from complications of alcohol abuse. The patient's mother died in her 71s from pancreatic cancer. The patient has 6 brothers,  2 sisters. Both her sisters have had breast cancer, both diagnosed after the age of 69. The patient has not had genetic testing so far.   GYNECOLOGIC HISTORY: Menarche age 55; first live birth age 30. She is GXP4. She is not sure when she stopped having periods. She never used hormone replacement.   SOCIAL HISTORY: The patient has worked in the past as a Agricultural engineer. At home in addition to the  patient are her husband [name], originally from [place], who works as a Naval architect. Also son [name] age 40 and daughter [name] age 36 (she is studying to be a Engineer, civil (consulting)). The other 2 children are [name], who has a college degree in psychology and works as a Clinical research associate; and [name], who lives in [place] and works as a Nurse, adult.   ADVANCED DIRECTIVES: Not in place  HEALTH MAINTENANCE: History  Substance Use Topics  . Smoking status: Current Every Day Smoker  . Smokeless tobacco: Never Used  . Alcohol Use: No     Colonoscopy:  Not on file  PAP:  Not on file  Bone density:  Not on file  Lipid panel:  Not on file  Allergies  Allergen Reactions  . Hydrocodone-Acetaminophen Itching and Rash    Current Outpatient Prescriptions  Medication Sig Dispense Refill  . amLODipine (NORVASC) 10 MG tablet Take 10 mg by mouth daily.        . Ascorbic Acid (VITAMIN C) 100 MG tablet Take 100 mg by mouth daily.        . calcium-vitamin D (OSCAL WITH D) 500-200 MG-UNIT per tablet Take 1 tablet by mouth daily.        Marland Kitchen gabapentin (NEURONTIN) 100 MG capsule TAKE 1 CAPSULE BY MOUTH AT BEDTIME  30 capsule  2  . LORazepam (ATIVAN) 0.5 MG tablet Take 1 tablet (0.5 mg total) by mouth 2 (two) times daily as needed for anxiety.  30 tablet  0  . Multiple Vitamin (MULTIVITAMIN) capsule Take 1 capsule by mouth daily.        . mupirocin ointment (BACTROBAN) 2 % Apply 1 application topically as needed.       Marland Kitchen oxyCODONE-acetaminophen (PERCOCET) 10-325 MG per tablet Take 1 tablet by mouth every 6 (six) hours as needed.       Marland Kitchen oxymorphone (OPANA) 10 MG tablet Take 10 mg by mouth 2 (two) times daily.       . promethazine (PHENERGAN) 25 MG tablet Take 1 tablet (25 mg total) by mouth every 6 (six) hours as needed for nausea.  30 tablet  0  . triamterene-hydrochlorothiazide (DYAZIDE) 37.5-25 MG per capsule Take 1 each (1 capsule total) by mouth every morning.  30 capsule  1  . promethazine-dextromethorphan  (PROMETHAZINE-DM) 6.25-15 MG/5ML syrup Take 5 mLs by mouth 4 (four) times daily as needed for cough.  118 mL  0   No current facility-administered medications for this visit.    OBJECTIVE: Middle-aged [place] American woman in no acute distress Filed Vitals:   04/04/13 1313  BP: 164/77  Pulse: 88  Temp: 97.8 F (36.6 C)  Resp: 20     Body mass index is 29.05 kg/(m^2).    ECOG FS: 1 Filed Weights   04/04/13 1313  Weight: 179 lb 14.4 oz (81.602 kg)   General appearance: Alert, cooperative, well nourished, no apparent distress Head: Normocephalic, without obvious abnormality, atraumatic, the patient is wearing a wig Eyes: Icteric sclerae, PERRLA, EOMI Nose: Nares, septum and mucosa are normal, no drainage or sinus  tenderness Neck: No adenopathy, supple, symmetrical, trachea midline,  no tenderness Resp: Clear to auscultation bilaterally Cardio: Regular rate and rhythm, S1, S2 normal, no murmur, click, rub or gallop Breasts:  Deferred GI: Soft, distended, non-tender, hypoactive bowel sounds, no organomegaly Extremities: Extremities normal, atraumatic, no cyanosis, minimal right upper extremity lymphedema - the patient is wearing a compression sleeve Lymph nodes: Cervical, supraclavicular, and axillary nodes normal, no cervical masses palpated Neurologic: Grossly normal  LAB RESULTS: Lab Results  Component Value Date   WBC 5.7 04/04/2013   NEUTROABS 2.9 04/04/2013   HGB 14.1 04/04/2013   HCT 43.0 04/04/2013   MCV 83.7 04/04/2013   PLT 103* 04/04/2013      Chemistry      Component Value Date/Time   NA 139 03/07/2013 1411   NA 137 02/14/2013 1648   K 3.5 03/07/2013 1411   K 3.2* 02/14/2013 1648   CL 101 03/07/2013 1411   CL 97 02/14/2013 1648   CO2 30* 03/07/2013 1411   CO2 29 02/14/2013 1648   BUN 11.9 03/07/2013 1411   BUN 10 02/14/2013 1648   CREATININE 0.8 03/07/2013 1411   CREATININE 0.68 02/14/2013 1648      Component Value Date/Time   CALCIUM 9.9 03/07/2013 1411   CALCIUM 9.8  02/14/2013 1648   ALKPHOS 155* 03/07/2013 1411   ALKPHOS 124* 02/14/2013 1648   AST 64* 03/07/2013 1411   AST 59* 02/14/2013 1648   ALT 31 03/07/2013 1411   ALT 28 02/14/2013 1648   BILITOT 0.56 03/07/2013 1411   BILITOT 0.4 02/14/2013 1648       Lab Results  Component Value Date   LABCA2 37 10/05/2012    No results found for this basename: INR,  in the last 168 hours  STUDIES: Most recent echocardiogram on 12/11/2012 showed an ejection fraction of 55-60%.   ASSESSMENT: 57 y.o. [place] woman with stage IV breast cancer manifested chiefly by loco-regional nodal disease (neck, chest), without lung, liver, bone, or brain involvement.  (1) status post right lumpectomy and sentinel lymph node sampling 03/04/2002 for 2 separate foci of invasive ductal carcinoma, mpT2 pN1 or stage IIB, both foci grade 3, both estrogen and progesterone receptor positive, both HercepTest 3+, Mib-1 56%  (2) reexcision for margins 05/27/2002 showed no residual cancer in the breast  (3) the patient refused adjuvant systemic therapy  (4) adjuvant radiation treatment completed 08/14/2002  (5) diffuse recurrence in the right breast 02/24/2004 showing a morphologically different tumor, again grade 3, again estrogen and progesterone receptor negative, with an MIB-1 of 14% and Herceptest 3+  (5) between May and September 2005 received dose dense doxorubicin/cyclophosphamide x4 given with Herceptin, followed by weekly docetaxel x8, again given with Herceptin  (6) right mastectomy 07/13/2004 showed scattered microscopic foci of residual disease over an area of greater than 5 cm. Margins were negative.  (7) postoperative docetaxel continued to November of 2005  (8) trastuzumab/Herceptin given October 2005 through March 2013 with some brief interruptions  (9) isolated right cervical nodal recurrence December of 2006 treated with radiation to the right supraclavicular area (total 41.5 gray) completed 12/29/2005  (10)  navelbine given together with Herceptin  January through May 2007  (9) left mastectomy 02/13/2006 for ductal carcinoma in situ, grade 2, estrogen and progesterone receptor negative, with negative margins; 0 of 3 lymph nodes involved  (10) the patient treated with lapatinib and capecitabine before December 2010, for an unclear duration and with unclear results (cannot locate data on chart review)  (  11) status post right supraclavicular lymph node biopsy December 2011 again positive for an invasive ductal carcinoma, estrogen and progesterone receptor negative, HER-2 positive by CISH with a ratio 4.25.  (12) navelbine given together with Herceptin between July 2012 and January 2013.  (13) carboplatin/gemcitabine/Herceptin given for 2 cycles, February and March 2013  (14) TDM-1 Daiva Huge) started April 2013. Most recent echo on 12/11/2012 showed a well preserved ejection fraction  (15) deep vein thrombosis of the right upper extremity documented 04/20/2011.  She completed anticoagulant therapy with Coumadin on 03/25/2013.  (16) chronic right upper extremity lymphedema  (17) right chest port-a-cath removal due to infection on 01/28/2013.   PLAN: Mrs.Jazira Sakamoto is doing quite well given her history of stage IV breast cancer. Interventional radiology has been contacted and will schedule the patient for Port-A-Cath insertion (on the left side) next week.  The patient is also scheduled to see Dr. Gala [name] for a 2-D echocardiogram before proceeding with her next TDM-1 infusion.  She had been tolerating the TDM-1 with no evidence of cardiac toxicity. The plan is to continue TDM-1 every 3 weeks until we have clear evidence of disease progression.   We plan to see the patient again in one month after 2-D echo completion and left chest Port-A-Cath is placed.  If the patient has a satisfactory cardiac ejection fraction, we will resume Q3 week TDM-1 infusions at that time.  We will check a CBC, CMP, LDH,  and vitamin D level during her next office visit.  The patient is receiving pain medications from her primary care physician Dr. Ronne Binning. She states that the Tramadol prescribed to her by [name] was not affecting her pain.  Larina Bras  NP-C 04/04/2013 4:46 PM

## 2013-04-05 ENCOUNTER — Other Ambulatory Visit: Payer: Self-pay | Admitting: *Deleted

## 2013-04-05 ENCOUNTER — Other Ambulatory Visit: Payer: Self-pay | Admitting: Radiology

## 2013-04-05 MED ORDER — PROMETHAZINE-CODEINE 6.25-10 MG/5ML PO SYRP: ORAL_SOLUTION | ORAL | Status: DC

## 2013-04-08 ENCOUNTER — Other Ambulatory Visit: Payer: Self-pay | Admitting: Family

## 2013-04-08 ENCOUNTER — Ambulatory Visit (HOSPITAL_COMMUNITY)
Admission: RE | Admit: 2013-04-08 | Discharge: 2013-04-08 | Disposition: A | Discharge status: Home / Self Care | Payer: Commercial Managed Care - PPO | Source: Ambulatory Visit | Attending: Family | Admitting: Family

## 2013-04-08 ENCOUNTER — Telehealth: Payer: Self-pay | Admitting: Oncology

## 2013-04-08 ENCOUNTER — Encounter (HOSPITAL_COMMUNITY): Payer: Self-pay

## 2013-04-08 DIAGNOSIS — F17200 Nicotine dependence, unspecified, uncomplicated: Secondary | ICD-10-CM | POA: Insufficient documentation

## 2013-04-08 DIAGNOSIS — C50919 Malignant neoplasm of unspecified site of unspecified female breast: Secondary | ICD-10-CM

## 2013-04-08 DIAGNOSIS — Z79899 Other long term (current) drug therapy: Secondary | ICD-10-CM | POA: Insufficient documentation

## 2013-04-08 DIAGNOSIS — Z803 Family history of malignant neoplasm of breast: Secondary | ICD-10-CM | POA: Insufficient documentation

## 2013-04-08 DIAGNOSIS — Z86718 Personal history of other venous thrombosis and embolism: Secondary | ICD-10-CM | POA: Insufficient documentation

## 2013-04-08 DIAGNOSIS — I10 Essential (primary) hypertension: Secondary | ICD-10-CM | POA: Insufficient documentation

## 2013-04-08 DIAGNOSIS — Z8509 Personal history of malignant neoplasm of other digestive organs: Secondary | ICD-10-CM | POA: Insufficient documentation

## 2013-04-08 DIAGNOSIS — Z8051 Family history of malignant neoplasm of kidney: Secondary | ICD-10-CM | POA: Insufficient documentation

## 2013-04-08 LAB — CBC WITH DIFFERENTIAL/PLATELET
Basophils Absolute: 0.1 10*3/uL (ref 0.0–0.1)
Basophils Relative: 1 % (ref 0–1)
Eosinophils Absolute: 0.3 10*3/uL (ref 0.0–0.7)
Eosinophils Relative: 5 % (ref 0–5)
HCT: 40.6 % (ref 36.0–46.0)
Hemoglobin: 13.5 g/dL (ref 12.0–15.0)
Lymphocytes Relative: 30 % (ref 12–46)
Lymphs Abs: 1.8 10*3/uL (ref 0.7–4.0)
MCH: 27.7 pg (ref 26.0–34.0)
MCHC: 33.3 g/dL (ref 30.0–36.0)
MCV: 83.4 fL (ref 78.0–100.0)
Monocytes Absolute: 0.7 10*3/uL (ref 0.1–1.0)
Monocytes Relative: 12 % (ref 3–12)
Neutro Abs: 3.2 10*3/uL (ref 1.7–7.7)
Neutrophils Relative %: 52 % (ref 43–77)
Platelets: 110 10*3/uL — ABNORMAL LOW (ref 150–400)
RBC: 4.87 MIL/uL (ref 3.87–5.11)
RDW: 17.7 % — ABNORMAL HIGH (ref 11.5–15.5)
WBC: 6.1 10*3/uL (ref 4.0–10.5)

## 2013-04-08 LAB — PROTIME-INR
INR: 1.13 (ref 0.00–1.49)
Prothrombin Time: 14.3 seconds (ref 11.6–15.2)

## 2013-04-08 LAB — APTT: aPTT: 34 seconds (ref 24–37)

## 2013-04-08 MED ORDER — FENTANYL CITRATE 0.05 MG/ML IJ SOLN
INTRAMUSCULAR | Status: AC | PRN
  Administered 2013-04-08: 100 ug via INTRAVENOUS
  Administered 2013-04-08: 50 ug via INTRAVENOUS

## 2013-04-08 MED ORDER — MIDAZOLAM HCL 2 MG/2ML IJ SOLN
INTRAMUSCULAR | Status: AC
  Filled 2013-04-08: qty 6

## 2013-04-08 MED ORDER — SODIUM CHLORIDE 0.9 % IV SOLN
INTRAVENOUS | Status: DC
  Administered 2013-04-08: 08:00:00 via INTRAVENOUS

## 2013-04-08 MED ORDER — CEFAZOLIN SODIUM-DEXTROSE 2-3 GM-% IV SOLR
INTRAVENOUS | Status: AC
  Filled 2013-04-08: qty 50

## 2013-04-08 MED ORDER — FENTANYL CITRATE 0.05 MG/ML IJ SOLN
INTRAMUSCULAR | Status: AC
  Filled 2013-04-08: qty 6

## 2013-04-08 MED ORDER — MIDAZOLAM HCL 2 MG/2ML IJ SOLN
INTRAMUSCULAR | Status: AC | PRN
  Administered 2013-04-08 (×3): 1 mg via INTRAVENOUS

## 2013-04-08 MED ORDER — LIDOCAINE HCL 1 % IJ SOLN
INTRAMUSCULAR | Status: AC
  Filled 2013-04-08: qty 20

## 2013-04-08 MED ORDER — CEFAZOLIN SODIUM-DEXTROSE 2-3 GM-% IV SOLR
2.0000 g | Freq: Once | INTRAVENOUS | Status: AC
  Administered 2013-04-08: 2 g via INTRAVENOUS

## 2013-04-08 MED ORDER — HEPARIN SOD (PORK) LOCK FLUSH 100 UNIT/ML IV SOLN
INTRAVENOUS | Status: DC | PRN
  Administered 2013-04-08: 500 [IU]

## 2013-04-08 NOTE — H&P (Signed)
[name] is an 57 y.o. female.   Chief Complaint: "I'm getting a port a cath" HPI: Patient with history of metastatic breast carcinoma, RUE DVT 2012 and prior removal of infected right subclavian port 01/2013  presents today for placement of a left sided port a cath for chemotherapy.  Past Medical History  Diagnosis Date  . DVT (deep venous thrombosis) 10/07/2011  . Breast cancer   . Hypertension   . Rheumatic fever     Past Surgical History  Procedure Laterality Date  . Mastectomy      bilateral  . Breast surgery      Family History  Problem Relation Age of Onset  . Pancreatic cancer Mother   . Kidney cancer Sister 24  . Breast cancer Sister 37  . Breast cancer Other 9   Social History:  reports that she has been smoking.  She has never used smokeless tobacco. She reports that she does not drink alcohol or use illicit drugs.  Allergies:  Allergies  Allergen Reactions  . Hydrocodone-Acetaminophen Itching and Rash    Current outpatient prescriptions:alprazolam (XANAX) 2 MG tablet, , Disp: , Rfl: ;  amLODipine (NORVASC) 10 MG tablet, Take 10 mg by mouth daily.  , Disp: , Rfl: ;  calcium-vitamin D (OSCAL WITH D) 500-200 MG-UNIT per tablet, Take 1 tablet by mouth daily.  , Disp: , Rfl: ;  gabapentin (NEURONTIN) 100 MG capsule, TAKE 1 CAPSULE BY MOUTH AT BEDTIME, Disp: 30 capsule, Rfl: 2 Multiple Vitamin (MULTIVITAMIN) capsule, Take 1 capsule by mouth daily.  , Disp: , Rfl: ;  mupirocin ointment (BACTROBAN) 2 %, Apply 1 application topically as needed. , Disp: , Rfl: ;  oxyCODONE-acetaminophen (PERCOCET) 10-325 MG per tablet, Take 1 tablet by mouth every 6 (six) hours as needed. , Disp: , Rfl: ;  oxymorphone (OPANA) 10 MG tablet, Take 10 mg by mouth 2 (two) times daily. , Disp: , Rfl:  promethazine-codeine (PHENERGAN WITH CODEINE) 6.25-10 MG/5ML syrup, po q 6-8 hrs prn cough, Disp: 180 mL, Rfl: 0;  triamterene-hydrochlorothiazide (DYAZIDE) 37.5-25 MG per capsule, Take 1 each  (1 capsule total) by mouth every morning., Disp: 30 capsule, Rfl: 1;  Ascorbic Acid (VITAMIN C) 100 MG tablet, Take 100 mg by mouth daily.  , Disp: , Rfl:  LORazepam (ATIVAN) 0.5 MG tablet, Take 1 tablet (0.5 mg total) by mouth 2 (two) times daily as needed for anxiety., Disp: 30 tablet, Rfl: 0;  promethazine (PHENERGAN) 25 MG tablet, Take 1 tablet (25 mg total) by mouth every 6 (six) hours as needed for nausea., Disp: 30 tablet, Rfl: 0 Current facility-administered medications:0.9 %  sodium chloride infusion, , Intravenous, Continuous, Brayton El, PA-C, Last Rate: 20 mL/hr at 04/08/13 0815;  ceFAZolin (ANCEF) IVPB 2 g/50 mL premix, 2 g, Intravenous, Once, Brayton El, PA-C   Results for orders placed during the hospital encounter of 04/08/13 (from the past 48 hour(s))  APTT     Status: None   Collection Time    04/08/13  8:10 AM      Result Value Range   aPTT 34  24 - 37 seconds  CBC WITH DIFFERENTIAL     Status: Abnormal   Collection Time    04/08/13  8:10 AM      Result Value Range   WBC 6.1  4.0 - 10.5 K/uL   RBC 4.87  3.87 - 5.11 MIL/uL   Hemoglobin 13.5  12.0 - 15.0 g/dL   HCT 16.1  09.6 - 04.5 %  MCV 83.4  78.0 - 100.0 fL   MCH 27.7  26.0 - 34.0 pg   MCHC 33.3  30.0 - 36.0 g/dL   RDW 16.1 (*) 09.6 - 04.5 %   Platelets 110 (*) 150 - 400 K/uL   Comment: SPECIMEN CHECKED FOR CLOTS     PLATELET COUNT CONFIRMED BY SMEAR   Neutrophils Relative % 52  43 - 77 %   Lymphocytes Relative 30  12 - 46 %   Monocytes Relative 12  3 - 12 %   Eosinophils Relative 5  0 - 5 %   Basophils Relative 1  0 - 1 %   Neutro Abs 3.2  1.7 - 7.7 K/uL   Lymphs Abs 1.8  0.7 - 4.0 K/uL   Monocytes Absolute 0.7  0.1 - 1.0 K/uL   Eosinophils Absolute 0.3  0.0 - 0.7 K/uL   Basophils Absolute 0.1  0.0 - 0.1 K/uL  PROTIME-INR     Status: None   Collection Time    04/08/13  8:10 AM      Result Value Range   Prothrombin Time 14.3  11.6 - 15.2 seconds   INR 1.13  0.00 - 1.49   No results  found.  Review of Systems  Constitutional: Negative for fever and chills.  Respiratory: Negative for cough and shortness of breath.   Cardiovascular: Negative for chest pain.  Gastrointestinal: Negative for nausea and vomiting.       Occ abd pain  Musculoskeletal: Negative for back pain.  Neurological: Negative for headaches.  Endo/Heme/Allergies: Does not bruise/bleed easily.    Blood pressure 147/94, pulse 81, temperature 97.5 F (36.4 C), temperature source Oral, resp. rate 20, SpO2 99.00%. Physical Exam  Constitutional: She is oriented to person, place, and time. She appears well-developed and well-nourished.  Cardiovascular: Normal rate.   Reg rhythm with occ ectopy noted  Respiratory: Effort normal.  Sl dim BS rt base, left clear  GI: Soft. Bowel sounds are normal. There is no tenderness.  Mildly protuberant  Musculoskeletal: Normal range of motion.  Chronic RUE lymphedema  Neurological: She is alert and oriented to person, place, and time.     Assessment/Plan Pt with hx metastatic breast carcinoma, RUE DVT 2012, and removal of infected right subclavian port a cath 01/2013. Plan is for placement of a left sided port a cath today. Details/risks of procedure d/w pt/daughter with their understanding and consent.  Altan Kraai,D KEVIN 04/08/2013, 9:03 AM

## 2013-04-08 NOTE — Procedures (Signed)
Procedure:  Port-a-cath placement Access:  Left IJ vein SL PAC via left IJ with cath tip at cavoatrial junction.  No PTX.  OK to use.

## 2013-04-08 NOTE — Telephone Encounter (Signed)
lmonvm for pt on both home/cell re appts for echo/Dr. bensimhon 6/10 @ 8am. Pt was aware I would call w/this appt and was given all other appts prior to leaving 5/29. Echo to [facility] for preauth.

## 2013-04-08 NOTE — H&P (Signed)
Agree 

## 2013-04-08 NOTE — Progress Notes (Signed)
Pt arrives today for prep for placement of PAC in IR. Pt states "I have had breast surgery in both breasts for cancer and they have used my foot before of an IV. I have lymphedema in my right arm and I don't want it in my left arm"Pt does have a compression sleeve on right arm. Upon further discussion with patient she states right arm was most recent mastectomy with lymphnodes removed and on left breast there was no lymphnode removal. I discussed this with Dr Eunice Blase and he said it was OK to start an IV in left hand for labs and IV Sedation for PAC placement and he would prefer not to do a foot stick for IV access. I explained this to patient and she is in agreement. IV start was done very easily with #24 and lab work drawn without problems.

## 2013-04-16 ENCOUNTER — Telehealth: Payer: Self-pay | Admitting: Oncology

## 2013-04-16 ENCOUNTER — Ambulatory Visit (HOSPITAL_COMMUNITY): Payer: Commercial Managed Care - PPO

## 2013-04-16 NOTE — Telephone Encounter (Signed)
Pt called today stating she missed her appt today @ [facility]. Pt was scheduled for echo/[name]. S/w Heather and r/s'd appts for 6/17 @ 11am - echo and 6/19 @ 10:40 am - Dr. Gala Romney. appts could not be combined due to they would not be able to get pt in for both appts at the same time prior to her next appt @ [facility]. S/w pt she is aware of both appt d/t/location.

## 2013-04-18 ENCOUNTER — Other Ambulatory Visit: Payer: Commercial Managed Care - PPO | Admitting: Lab

## 2013-04-18 ENCOUNTER — Ambulatory Visit: Payer: Commercial Managed Care - PPO

## 2013-04-23 ENCOUNTER — Ambulatory Visit (HOSPITAL_COMMUNITY)
Admission: RE | Admit: 2013-04-23 | Discharge: 2013-04-23 | Disposition: A | Discharge status: Home / Self Care | Payer: Commercial Managed Care - PPO | Source: Ambulatory Visit | Attending: Family Medicine | Admitting: Family Medicine

## 2013-04-23 ENCOUNTER — Other Ambulatory Visit (HOSPITAL_COMMUNITY): Payer: Self-pay | Admitting: Internal Medicine

## 2013-04-23 DIAGNOSIS — I10 Essential (primary) hypertension: Secondary | ICD-10-CM | POA: Insufficient documentation

## 2013-04-23 DIAGNOSIS — C50919 Malignant neoplasm of unspecified site of unspecified female breast: Secondary | ICD-10-CM

## 2013-04-23 DIAGNOSIS — Z09 Encounter for follow-up examination after completed treatment for conditions other than malignant neoplasm: Secondary | ICD-10-CM

## 2013-04-23 NOTE — Progress Notes (Signed)
  Echocardiogram 2D Echocardiogram limited has been performed.  Hawthorne Day 04/23/2013, 11:24 AM

## 2013-04-25 ENCOUNTER — Encounter (HOSPITAL_COMMUNITY): Payer: Self-pay

## 2013-04-25 ENCOUNTER — Ambulatory Visit (HOSPITAL_COMMUNITY)
Admission: RE | Admit: 2013-04-25 | Discharge: 2013-04-25 | Disposition: A | Discharge status: Home / Self Care | Payer: Commercial Managed Care - PPO | Source: Ambulatory Visit | Attending: Internal Medicine | Admitting: Internal Medicine

## 2013-04-25 VITALS — BP 110/70 | HR 106 | Wt 183.0 lb

## 2013-04-25 DIAGNOSIS — Z170 Estrogen receptor positive status [ER+]: Secondary | ICD-10-CM | POA: Insufficient documentation

## 2013-04-25 DIAGNOSIS — Z86718 Personal history of other venous thrombosis and embolism: Secondary | ICD-10-CM | POA: Insufficient documentation

## 2013-04-25 DIAGNOSIS — Z9010 Acquired absence of unspecified breast and nipple: Secondary | ICD-10-CM | POA: Insufficient documentation

## 2013-04-25 DIAGNOSIS — C50919 Malignant neoplasm of unspecified site of unspecified female breast: Secondary | ICD-10-CM | POA: Insufficient documentation

## 2013-04-25 DIAGNOSIS — I10 Essential (primary) hypertension: Secondary | ICD-10-CM | POA: Insufficient documentation

## 2013-04-25 DIAGNOSIS — Z87898 Personal history of other specified conditions: Secondary | ICD-10-CM | POA: Insufficient documentation

## 2013-04-25 DIAGNOSIS — Z79899 Other long term (current) drug therapy: Secondary | ICD-10-CM | POA: Insufficient documentation

## 2013-04-25 NOTE — Assessment & Plan Note (Addendum)
Explained the purpose of the cardio onc clinic. Discussed that there is a ~ 10 % chance for cardio toxicity on Herceptin.  Dr Gala Romney discussed and reviewed ECHO. EF and lateral s' stable. Follow up in 3 months with an ECHO.   Patient seen and examined with Tonye Becket, NP. We discussed all aspects of the encounter. I agree with the assessment and plan as stated above.  Explained incidence of Herceptin cardiotoxicity and role of Cardio-oncology clinic at length. Given h/o doxorubicin exposure, I told her the risk of cardiotoxicity with Herceptin may be as high as 30%.  Echo images reviewed personally. All parameters stable. Reviewed signs and symptoms of HF to look for. Continue Herceptin. Follow-up with echo in 3 months.

## 2013-04-25 NOTE — Progress Notes (Signed)
Patient ID: [name], female   DOB: Mar 27, 1956, 57 y.o.   MRN: [id number] Oncologist: Dr Darnelle Catalan PCP: Dr Welton Flakes Surgeon: Dr Donell Beers   HPI:  [name] is a 57 y.o. woman with a history of HTN, rheumatic fever, stage IV breast cancer referred by Dr. Darnelle Catalan for enrollment into the cardio-oncology clinic.  She was initially diagnosed with breast cancer 2003 and had recurrence in 2005. In 2003 she underwent  right lumpectomy and sentinel lymph node sampling of 2 separate foci of invasive ductal carcinoma, mpT2 pN1 or stage IIB, both foci grade 3, both ER/PR positive, both Her 2 neu +, Mib-1 56. Then underwent  right mastectomy 07/13/2004 and S/P left mastectomy 02/13/2006. Also had DVT RUE 2012- on coumadin for 1 year.     Between May and September 2005 received dose dense doxorubicin/cyclophosphamide x 4 given with Herceptin, followed by weekly docetaxel x 8, again given with Herceptin. Carboplatin/gemcitabine/Herceptin given for 2 cycles, February and March 2013. Navelbine given together with Herceptin between July 2012 and January 2013.    ECHOs 05/08/12 EF 55-60% 09/10/2013 EF 55-65% 12/11/12 EF 55-60% lateral s' 11.4 04/23/13 EF 55-60% lateral s' 13.7  She denies any h/o known cardiac disease. Denies SOB/PND/Orthopnea/edema.       Review of Systems:     Cardiac Review of Systems: {Y] = yes [ ]  = no  Chest Pain [    ]  Resting SOB [   ] Exertional SOB  [  ]  Orthopnea [  ]   Pedal Edema [   ]    Palpitations [  ] Syncope  [  ]   Presyncope [   ]  General Review of Systems: [Y] = yes [  ]=no Constitional: recent weight change [  ]; anorexia [  ]; fatigue [ Y ]; nausea [  ]; night sweats [  ]; fever [  ]; or chills [  ];                                                                                                                                          Dental: poor dentition[  ]; Last Dentist visit:   Eye : blurred vision [  ]; diplopia [   ]; vision changes [  ];  Amaurosis  fugax[  ]; Resp: cough [  ];  wheezing[  ];  hemoptysis[  ]; shortness of breath[  ]; paroxysmal nocturnal dyspnea[  ]; dyspnea on exertion[  ]; or orthopnea[  ];  GI:  gallstones[  ], vomiting[  ];  dysphagia[  ]; melena[  ];  hematochezia [  ]; heartburn[  ];   Hx of  Colonoscopy[  ]; GU: kidney stones [  ]; hematuria[  ];   dysuria [  ];  nocturia[  ];  history of     obstruction [  ];  Skin: rash, swelling[  ];, hair loss[  ];  peripheral edema[  ];  or itching[  ]; Musculosketetal: myalgias[  ];  joint swelling[  ];  joint erythema[  ];  joint pain[ Y ];  back pain[  ];  Heme/Lymph: bruising[  ];  bleeding[  ];  anemia[  ];  Neuro: TIA[  ];  headaches[  ];  stroke[  ];  vertigo[  ];  seizures[  ];   paresthesias[  ];  difficulty walking[  ];  Psych:depression[  ]; anxiety[  ];  Endocrine: diabetes[  ];  thyroid dysfunction[  ];  Immunizations: Flu [  ]; Pneumococcal[  ];  Other:    Past Medical History  Diagnosis Date  . DVT (deep venous thrombosis) 10/07/2011  . Breast cancer   . Hypertension   . Rheumatic fever     Current Outpatient Prescriptions  Medication Sig Dispense Refill  . alprazolam (XANAX) 2 MG tablet       . amLODipine (NORVASC) 10 MG tablet Take 10 mg by mouth daily.        . Ascorbic Acid (VITAMIN C) 100 MG tablet Take 100 mg by mouth daily.        . calcium-vitamin D (OSCAL WITH D) 500-200 MG-UNIT per tablet Take 1 tablet by mouth daily.        Marland Kitchen gabapentin (NEURONTIN) 100 MG capsule TAKE 1 CAPSULE BY MOUTH AT BEDTIME  30 capsule  2  . Multiple Vitamin (MULTIVITAMIN) capsule Take 1 capsule by mouth daily.        . mupirocin ointment (BACTROBAN) 2 % Apply 1 application topically as needed.       Marland Kitchen oxyCODONE-acetaminophen (PERCOCET) 10-325 MG per tablet Take 1 tablet by mouth every 6 (six) hours as needed.       Marland Kitchen oxymorphone (OPANA) 10 MG tablet Take 10 mg by mouth 2 (two) times daily.       . promethazine (PHENERGAN) 25 MG tablet Take 1 tablet  (25 mg total) by mouth every 6 (six) hours as needed for nausea.  30 tablet  0  . promethazine-codeine (PHENERGAN WITH CODEINE) 6.25-10 MG/5ML syrup po q 6-8 hrs prn cough  180 mL  0  . triamterene-hydrochlorothiazide (DYAZIDE) 37.5-25 MG per capsule Take 1 each (1 capsule total) by mouth every morning.  30 capsule  1  . LORazepam (ATIVAN) 0.5 MG tablet Take 1 tablet (0.5 mg total) by mouth 2 (two) times daily as needed for anxiety.  30 tablet  0   No current facility-administered medications for this encounter.     Allergies  Allergen Reactions  . Hydrocodone-Acetaminophen Itching and Rash    History   Social History  . Marital Status: Single    Spouse Name: N/A    Number of Children: N/A  . Years of Education: N/A   Occupational History  . Not on file.   Social History Main Topics  . Smoking status: Former Games developer  . Smokeless tobacco: Never Used  . Alcohol Use: No  . Drug Use: No  . Sexually Active: Not on file   Other Topics Concern  . Not on file   Social History Narrative  . No narrative on file    Family History  Problem Relation Age of Onset  . Pancreatic cancer Mother   . Kidney cancer Sister 40  . Breast cancer Sister 70  . Breast cancer Other 33    PHYSICAL EXAM: Filed Vitals:   04/25/13 1056  BP:  110/70  Pulse: 106   General:  Well appearing. No respiratory difficulty HEENT: normal Neck: supple. no JVD. Carotids 2+ bilat; no bruits. No lymphadenopathy or thryomegaly appreciated. Cor: PMI nondisplaced. Regular rate & rhythm. No rubs, gallops or murmurs. L upper chest porta cath Lungs: clear Abdomen: soft, nontender, nondistended. No hepatosplenomegaly. No bruits or masses. Good bowel sounds. Extremities: no cyanosis, clubbing, rash, edema RUE compression sleeve Neuro: alert & oriented x 3, cranial nerves grossly intact. moves all 4 extremities w/o difficulty. Affect pleasant.    No results found for this or any previous visit (from the  past 24 hour(s)). No results found.   ASSESSMENT & PLAN:

## 2013-04-25 NOTE — Patient Instructions (Addendum)
Follow up in 3 months with an ECHO 

## 2013-05-02 ENCOUNTER — Telehealth: Payer: Self-pay | Admitting: *Deleted

## 2013-05-02 ENCOUNTER — Other Ambulatory Visit (HOSPITAL_BASED_OUTPATIENT_CLINIC_OR_DEPARTMENT_OTHER): Payer: Commercial Managed Care - PPO | Admitting: Lab

## 2013-05-02 ENCOUNTER — Encounter: Payer: Self-pay | Admitting: Family

## 2013-05-02 ENCOUNTER — Ambulatory Visit: Payer: Commercial Managed Care - PPO

## 2013-05-02 ENCOUNTER — Ambulatory Visit (HOSPITAL_BASED_OUTPATIENT_CLINIC_OR_DEPARTMENT_OTHER): Payer: Commercial Managed Care - PPO | Admitting: Family

## 2013-05-02 VITALS — BP 123/75 | HR 67 | Temp 98.4°F | Resp 20 | Ht 66.0 in | Wt 181.0 lb

## 2013-05-02 DIAGNOSIS — R110 Nausea: Secondary | ICD-10-CM

## 2013-05-02 DIAGNOSIS — C50919 Malignant neoplasm of unspecified site of unspecified female breast: Secondary | ICD-10-CM

## 2013-05-02 LAB — CBC WITH DIFFERENTIAL/PLATELET
BASO%: 0.6 % (ref 0.0–2.0)
Basophils Absolute: 0 10*3/uL (ref 0.0–0.1)
EOS%: 2.8 % (ref 0.0–7.0)
Eosinophils Absolute: 0.2 10*3/uL (ref 0.0–0.5)
HCT: 44 % (ref 34.8–46.6)
HGB: 14.6 g/dL (ref 11.6–15.9)
LYMPH%: 24.2 % (ref 14.0–49.7)
MCH: 28.3 pg (ref 25.1–34.0)
MCHC: 33.2 g/dL (ref 31.5–36.0)
MCV: 85.4 fL (ref 79.5–101.0)
MONO#: 0.5 10*3/uL (ref 0.1–0.9)
MONO%: 10 % (ref 0.0–14.0)
NEUT#: 3.3 10*3/uL (ref 1.5–6.5)
NEUT%: 62.4 % (ref 38.4–76.8)
Platelets: 102 10*3/uL — ABNORMAL LOW (ref 145–400)
RBC: 5.15 10*6/uL (ref 3.70–5.45)
RDW: 17.1 % — ABNORMAL HIGH (ref 11.2–14.5)
WBC: 5.3 10*3/uL (ref 3.9–10.3)
lymph#: 1.3 10*3/uL (ref 0.9–3.3)

## 2013-05-02 MED ORDER — PROMETHAZINE-CODEINE 6.25-10 MG/5ML PO SYRP: ORAL_SOLUTION | ORAL | Status: DC

## 2013-05-02 MED ORDER — PROMETHAZINE HCL 25 MG PO TABS: 25.0000 mg | ORAL_TABLET | Freq: Four times a day (QID) | ORAL | Status: DC | PRN

## 2013-05-02 NOTE — Telephone Encounter (Signed)
appts printed and made. Pt is aware that her tx for 05/23/13 will be adjust and tx will follow after seeing [facility].  Pt is aware that tx will be added to her schedule. i email MW to adjust and add tx...td

## 2013-05-02 NOTE — Telephone Encounter (Signed)
Per staff phone call and POF I have schedueld appts.  JMW  

## 2013-05-02 NOTE — Progress Notes (Signed)
ID: [name]   DOB: 1956-01-31  MR#: [id number]  WUJ#:[id number]  PCP: Billee Cashing, MD SU: Rose Phi. Maple [name], [name]/[name], [name] RAD NFA:[id number] [name], [name]   HISTORY OF PRESENT ILLNESS: The patient originally presented in May 2003 when she noticed a lump in the upper inner quadrant of her right breast in September 2003.  She sought attention and had a mammogram which showed an obvious carcinoma in the upper inner quadrant of the right breast, approximately 2 cm.  There were some enlarged lymph nodes in the axilla and an FNA done showed those consistent with malignant cells, most likely an invasive ductal carcinoma.  At that point she was unsure of what to do and was referred by Dr. Francina Ames for a discussion of her treatment options.  By biopsy, it was ER/PR negative and HER-2 was 3+.  DNA index was 1.42.  We reiterated that it was most important to have her disease surgically addressed at that point and had recommended lumpectomy and axillary nodes to be addressed with which she followed through and on 04-03-02, [name] performed a right partial mastectomy and a right axillary lymph node dissection.  Final pathology revealed a 2.4 cm. high grade, Grade III invasive ductal carcinoma with an adjacent .8 cm. also high grade invasive ductal carcinoma which was felt to represent an intramammary metastasis rather than a second primary.  A smaller mass was just medial to the larger mass.  The smaller mass was associated with high grade DCIS component.  There was no definite lymphovascular invasion identified.  However, one of fourteen axillary lymph nodes did contain a 1.5 cm. metastatic deposit.  Postoperatively she did very well.  We reiterated the fact that she did need adjuvant chemotherapy, however, she refused and decided that she would pursue radiation.  She received radiation and completed that on 08-14-02. Her subsequent history is as detailed below.  INTERVAL HISTORY: [name]  returns today for follow up of her recurrent metastatic breast cancer.  She is accompanied by her daughter-in-law [name] for today's office visit.  Her interval history is significant for ongoing myalgias and arthralgias, particularly upper extremity discomfort. [name] received a left chest Port-A-Cath on 04/08/2013. She was seen by Dr. Gala Romney on 04/25/2013 and was granted cardiac clearance to continue TDM-1 therapy.  We again discussed smoking cessation during her office visit.  REVIEW OF SYSTEMS: [name] denies any fevers or chills. She denies hot flashes, night sweats and vaginal dryness. She denies any skin changes and has had no signs of abnormal bleeding.  She has a productive cough with green phlegm.  She denies increased shortness of breath, or chest pain.  She has ongoing anxiety and depression.  She has chronic right upper extremity lymphedema. She has not experienced any unusual headaches.  Dr. Thea Silversmith continues to prescribe Opana and Percocet pain medication for her.  A detailed review of systems is otherwise stable and noncontributory.      PAST MEDICAL HISTORY: Past Medical History  Diagnosis Date  . DVT (deep venous thrombosis) 10/07/2011  . Breast cancer   . Hypertension   . Rheumatic fever     PAST SURGICAL HISTORY: Past Surgical History  Procedure Laterality Date  . Mastectomy      bilateral  . Breast surgery    . Port a cath revision      FAMILY HISTORY Family History  Problem Relation Age of Onset  . Pancreatic cancer Mother   . Kidney cancer Sister 70  .  Breast cancer Sister 27  . Breast cancer Other 90  The patient's father died in his 50 6 from complications of alcohol abuse. The patient's mother died in her 105s from pancreatic cancer. The patient has 6 brothers, 2 sisters. Both her sisters have had breast cancer, both diagnosed after the age of 28. The patient has not had genetic testing so far.   GYNECOLOGIC HISTORY: Menarche age 70; first live  birth age 66. She is GXP4. She is not sure when she stopped having periods. She never used hormone replacement.   SOCIAL HISTORY: The patient has worked in the past as a Agricultural engineer. At home in addition to the patient are her husband [name], originally from [place], who works as a Naval architect. Also son [name] age 27 and daughter [name] age 32 (she is studying to be a Engineer, civil (consulting)). The other 2 children are [name], who has a college degree in psychology and works as a Clinical research associate; and [name], who lives in [place] and works as a Nurse, adult.   ADVANCED DIRECTIVES: Not in place  HEALTH MAINTENANCE: History  Substance Use Topics  . Smoking status: Former Games developer  . Smokeless tobacco: Never Used  . Alcohol Use: No     Colonoscopy:  Not on file  PAP:  Not on file  Bone density:  Not on file  Lipid panel:  Not on file  Allergies  Allergen Reactions  . Hydrocodone-Acetaminophen Itching and Rash    Current Outpatient Prescriptions  Medication Sig Dispense Refill  . alprazolam (XANAX) 2 MG tablet Take 2 mg by mouth daily.       Marland Kitchen amLODipine (NORVASC) 10 MG tablet Take 10 mg by mouth daily.        . Ascorbic Acid (VITAMIN C) 100 MG tablet Take 100 mg by mouth daily.        . calcium-vitamin D (OSCAL WITH D) 500-200 MG-UNIT per tablet Take 1 tablet by mouth daily.        Marland Kitchen gabapentin (NEURONTIN) 100 MG capsule TAKE 1 CAPSULE BY MOUTH AT BEDTIME  30 capsule  2  . metaxalone (SKELAXIN) 800 MG tablet Take 800 mg by mouth 3 (three) times daily.       . Multiple Vitamin (MULTIVITAMIN) capsule Take 1 capsule by mouth daily.        . mupirocin ointment (BACTROBAN) 2 % Apply 1 application topically as needed.       Marland Kitchen oxyCODONE-acetaminophen (PERCOCET) 10-325 MG per tablet Take 1 tablet by mouth every 6 (six) hours as needed.       Marland Kitchen oxymorphone (OPANA) 10 MG tablet Take 10 mg by mouth 2 (two) times daily.       . promethazine (PHENERGAN) 25 MG tablet Take 1 tablet (25 mg total) by mouth every 6  (six) hours as needed for nausea.  30 tablet  0  . promethazine-codeine (PHENERGAN WITH CODEINE) 6.25-10 MG/5ML syrup po q 6-8 hrs prn cough  180 mL  0  . traMADol (ULTRAM) 50 MG tablet Take 50 mg by mouth every 6 (six) hours as needed.       . triamterene-hydrochlorothiazide (DYAZIDE) 37.5-25 MG per capsule Take 1 each (1 capsule total) by mouth every morning.  30 capsule  1   No current facility-administered medications for this visit.    OBJECTIVE: Middle-aged [place] American woman in no acute distress Filed Vitals:   05/02/13 1131  BP: 123/75  Pulse: 67  Temp: 98.4 F (36.9 C)  Resp: 20  Body mass index is 29.23 kg/(m^2).    ECOG FS: 1 Filed Weights   05/02/13 1131  Weight: 181 lb (82.101 kg)   General appearance: Alert, cooperative, well nourished, no apparent distress Head: Normocephalic, without obvious abnormality, atraumatic, the patient is wearing a wig Eyes: Icteric sclerae, PERRLA, EOMI Nose: Nares, septum and mucosa are normal, no drainage or sinus tenderness Neck: No adenopathy, supple, symmetrical, trachea midline,  no tenderness Resp: Clear to auscultation bilaterally Cardio: Regular rate and rhythm, S1, S2 normal, no murmur, click, rub or gallop, left chest Port-A-Cath is without signs of infection Breasts:  Deferred GI: Soft, distended, non-tender, hypoactive bowel sounds, no organomegaly Extremities: Extremities normal, atraumatic, no cyanosis, minimal right upper extremity lymphedema - the patient is wearing a compression sleeve Lymph nodes: Cervical, supraclavicular, and axillary nodes normal, no cervical masses palpated Neurologic: Grossly normal  LAB RESULTS: Lab Results  Component Value Date   WBC 5.3 05/02/2013   NEUTROABS 3.3 05/02/2013   HGB 14.6 05/02/2013   HCT 44.0 05/02/2013   MCV 85.4 05/02/2013   PLT 102* 05/02/2013      Chemistry      Component Value Date/Time   NA 139 03/07/2013 1411   NA 137 02/14/2013 1648   K 3.5 03/07/2013 1411    K 3.2* 02/14/2013 1648   CL 101 03/07/2013 1411   CL 97 02/14/2013 1648   CO2 30* 03/07/2013 1411   CO2 29 02/14/2013 1648   BUN 11.9 03/07/2013 1411   BUN 10 02/14/2013 1648   CREATININE 0.8 03/07/2013 1411   CREATININE 0.68 02/14/2013 1648      Component Value Date/Time   CALCIUM 9.9 03/07/2013 1411   CALCIUM 9.8 02/14/2013 1648   ALKPHOS 155* 03/07/2013 1411   ALKPHOS 124* 02/14/2013 1648   AST 64* 03/07/2013 1411   AST 59* 02/14/2013 1648   ALT 31 03/07/2013 1411   ALT 28 02/14/2013 1648   BILITOT 0.56 03/07/2013 1411   BILITOT 0.4 02/14/2013 1648      Lab Results  Component Value Date   LABCA2 37 10/05/2012    No results found for this basename: INR,  in the last 168 hours  STUDIES: Most recent echocardiogram on 04/23/2013 showed an ejection fraction of 55-60%.   ASSESSMENT: 57 y.o.  woman with stage IV breast cancer manifested chiefly by loco-regional nodal disease (neck, chest), without lung, liver, bone, or brain involvement.  (1) Status post right lumpectomy and sentinel lymph node sampling 03/04/2002 for 2 separate foci of invasive ductal carcinoma, mpT2 pN1 or stage IIB, both foci grade 3, both estrogen and progesterone receptor positive, both HercepTest 3+, Mib-1 56%  (2) Reexcision for margins 05/27/2002 showed no residual cancer in the breast.  (3) The patient refused adjuvant systemic therapy.  (4) Adjuvant radiation treatment completed 08/14/2002.  (5) Diffuse recurrence in the right breast in 02/2004 showing a morphologically different tumor, again grade 3, again estrogen and progesterone receptor negative, with an MIB-1 of 14% and Herceptest 3+.  (5) Between May and September 2005 received dose dense Doxorubicin/Cyclophosphamide x 4 given with Herceptin, followed by weekly Docetaxel x 8, again given with Herceptin.  (6) Right mastectomy 07/13/2004 showed scattered microscopic foci of residual disease over an area of greater than 5 cm. Margins were negative.  (7)  Postoperative Docetaxel continued until 09/2004.  (8) Trastuzumab (Herceptin) given 08/2004 through 01/2012 with some brief interruptions.  (9) Isolated right cervical nodal recurrence 10/2005, treated with radiation to the right supraclavicular area (total 41.5  gray) completed 12/29/2005. (10) Navelbine given together with Herceptin  11/2005 through 03/2006.  (9) left mastectomy 02/13/2006 for ductal carcinoma in situ, grade 2, estrogen and progesterone receptor negative, with negative margins; 0 of 3 lymph nodes involved  (10) The patient treated with Lapatinib and Capecitabine before 10/2009, for an unclear duration and with unclear results (cannot locate data on chart review).  (11) Status post right supraclavicular lymph node biopsy 09/2010 again positive for an invasive ductal carcinoma, estrogen and progesterone receptor negative, HER-2 positive by CISH with a ratio 4.25.  (12) Navelbine given together with Herceptin between 05/2011 and 11/2011.  (13) Carboplatin/Gemcitabine/Herceptin given for 2 cycles, 12/2011 and 161096.  (14) TDM-1 (Kadcyla) started 02/2012. Most recent echo on 04/23/2013 showed a well preserved ejection fraction.  (15) Deep vein thrombosis of the right upper extremity documented 04/20/2011.  She completed anticoagulant therapy with Coumadin on 03/25/2013.  (16) Chronic right upper extremity lymphedema.  (17) Right chest port-a-cath removal due to infection on 01/28/2013.  (18)  Left chest Port-A-Cath placed on 04/08/2013.   PLAN: [name] is doing quite well given her history of stage IV breast cancer. She had been tolerating the TDM-1 with no evidence of cardiac toxicity. The plan is to continue TDM-1 every 3 weeks until we have clear evidence of disease progression.  Her last TDM-1 infusion was on 03/07/2013.  She declined having TDM-1 infusion today and did not want to start therapy again until after the Fourth of July holiday.  She recently  received cardiac clearance by Dr. Gala Romney to continue TDM-1 therapy.  [name] is receiving pain medications from her primary care physician Dr. Ronne Binning.  She was provided with a prescription today for Phenergan tablets 25 mg every 6 hours when necessary for nausea #30 with 0 refills and Phenergan with codeine cough syrup 6.25-10 mg per 5 mL,  5 mL by mouth every 6-8 hours when necessary cough 180 mL with no refills.  We plan to see [name] again on 05/23/2013 when she is scheduled to resume every three-week TDM-1 therapy.  We will check laboratories of CBC and CMP during her next office visit  All questions answered.  [name] was encouraged to contact us in the interim with any questions, concerns, or problems.   Larina Bras  NP-C 05/03/2013 3:31 PM

## 2013-05-02 NOTE — Patient Instructions (Signed)
Please contact us at [phone] if you have any questions or concerns.  Please continue to do well and enjoy life!!!  Get plenty of rest, drink plenty of water, exercise daily (walking), eat a balanced diet.  Take vitamin D3 1000 IUs daily.  Results for orders placed in visit on 05/02/13 (from the past 24 hour(s))  CBC WITH DIFFERENTIAL     Status: Abnormal   Collection Time    05/02/13 10:50 AM      Result Value Range   WBC 5.3  3.9 - 10.3 10e3/uL   NEUT# 3.3  1.5 - 6.5 10e3/uL   HGB 14.6  11.6 - 15.9 g/dL   HCT 98.1  19.1 - 47.8 %   Platelets 102 (*) 145 - 400 10e3/uL   MCV 85.4  79.5 - 101.0 fL   MCH 28.3  25.1 - 34.0 pg   MCHC 33.2  31.5 - 36.0 g/dL   RBC 2.95  6.21 - 3.08 10e6/uL   RDW 17.1 (*) 11.2 - 14.5 %   lymph# 1.3  0.9 - 3.3 10e3/uL   MONO# 0.5  0.1 - 0.9 10e3/uL   Eosinophils Absolute 0.2  0.0 - 0.5 10e3/uL   Basophils Absolute 0.0  0.0 - 0.1 10e3/uL   NEUT% 62.4  38.4 - 76.8 %   LYMPH% 24.2  14.0 - 49.7 %   MONO% 10.0  0.0 - 14.0 %   EOS% 2.8  0.0 - 7.0 %   BASO% 0.6  0.0 - 2.0 %   Narrative:    Performed At:  [facility]               [address]               [place], [place] [address]

## 2013-05-23 ENCOUNTER — Other Ambulatory Visit: Payer: Commercial Managed Care - PPO | Admitting: Lab

## 2013-05-23 ENCOUNTER — Other Ambulatory Visit (HOSPITAL_BASED_OUTPATIENT_CLINIC_OR_DEPARTMENT_OTHER): Payer: Commercial Managed Care - PPO | Admitting: Lab

## 2013-05-23 ENCOUNTER — Encounter: Payer: Self-pay | Admitting: Family

## 2013-05-23 ENCOUNTER — Ambulatory Visit (HOSPITAL_BASED_OUTPATIENT_CLINIC_OR_DEPARTMENT_OTHER): Payer: Commercial Managed Care - PPO | Admitting: Family

## 2013-05-23 ENCOUNTER — Other Ambulatory Visit: Payer: Self-pay | Admitting: *Deleted

## 2013-05-23 ENCOUNTER — Ambulatory Visit (HOSPITAL_BASED_OUTPATIENT_CLINIC_OR_DEPARTMENT_OTHER): Payer: Commercial Managed Care - PPO

## 2013-05-23 VITALS — BP 119/76 | HR 83 | Temp 98.7°F | Resp 20 | Ht 66.0 in | Wt 178.5 lb

## 2013-05-23 DIAGNOSIS — C50219 Malignant neoplasm of upper-inner quadrant of unspecified female breast: Secondary | ICD-10-CM

## 2013-05-23 DIAGNOSIS — Z5112 Encounter for antineoplastic immunotherapy: Secondary | ICD-10-CM

## 2013-05-23 DIAGNOSIS — C770 Secondary and unspecified malignant neoplasm of lymph nodes of head, face and neck: Secondary | ICD-10-CM

## 2013-05-23 DIAGNOSIS — C773 Secondary and unspecified malignant neoplasm of axilla and upper limb lymph nodes: Secondary | ICD-10-CM

## 2013-05-23 DIAGNOSIS — C50919 Malignant neoplasm of unspecified site of unspecified female breast: Secondary | ICD-10-CM

## 2013-05-23 DIAGNOSIS — Z171 Estrogen receptor negative status [ER-]: Secondary | ICD-10-CM

## 2013-05-23 DIAGNOSIS — R110 Nausea: Secondary | ICD-10-CM

## 2013-05-23 LAB — CBC WITH DIFFERENTIAL/PLATELET
BASO%: 0.6 % (ref 0.0–2.0)
Basophils Absolute: 0 10*3/uL (ref 0.0–0.1)
EOS%: 2.2 % (ref 0.0–7.0)
Eosinophils Absolute: 0.1 10*3/uL (ref 0.0–0.5)
HCT: 46.6 % (ref 34.8–46.6)
HGB: 15.6 g/dL (ref 11.6–15.9)
LYMPH%: 35.1 % (ref 14.0–49.7)
MCH: 28.8 pg (ref 25.1–34.0)
MCHC: 33.5 g/dL (ref 31.5–36.0)
MCV: 86.1 fL (ref 79.5–101.0)
MONO#: 0.6 10*3/uL (ref 0.1–0.9)
MONO%: 8.9 % (ref 0.0–14.0)
NEUT#: 3.4 10*3/uL (ref 1.5–6.5)
NEUT%: 53.2 % (ref 38.4–76.8)
Platelets: 114 10*3/uL — ABNORMAL LOW (ref 145–400)
RBC: 5.41 10*6/uL (ref 3.70–5.45)
RDW: 16.4 % — ABNORMAL HIGH (ref 11.2–14.5)
WBC: 6.3 10*3/uL (ref 3.9–10.3)
lymph#: 2.2 10*3/uL (ref 0.9–3.3)
nRBC: 0 % (ref 0–0)

## 2013-05-23 LAB — COMPREHENSIVE METABOLIC PANEL (CC13)
ALT: 24 U/L (ref 0–55)
AST: 46 U/L — ABNORMAL HIGH (ref 5–34)
Albumin: 4.1 g/dL (ref 3.5–5.0)
Alkaline Phosphatase: 126 U/L (ref 40–150)
BUN: 17.5 mg/dL (ref 7.0–26.0)
CO2: 26 mEq/L (ref 22–29)
Calcium: 10.4 mg/dL (ref 8.4–10.4)
Chloride: 101 mEq/L (ref 98–109)
Creatinine: 0.8 mg/dL (ref 0.6–1.1)
Glucose: 104 mg/dl (ref 70–140)
Potassium: 3.6 mEq/L (ref 3.5–5.1)
Sodium: 139 mEq/L (ref 136–145)
Total Bilirubin: 0.53 mg/dL (ref 0.20–1.20)
Total Protein: 8.5 g/dL — ABNORMAL HIGH (ref 6.4–8.3)

## 2013-05-23 MED ORDER — SODIUM CHLORIDE 0.9 % IJ SOLN
10.0000 mL | INTRAMUSCULAR | Status: DC | PRN
  Administered 2013-05-23: 10 mL
  Filled 2013-05-23: qty 10

## 2013-05-23 MED ORDER — SODIUM CHLORIDE 0.9 % IV SOLN
Freq: Once | INTRAVENOUS | Status: AC
  Administered 2013-05-23: 13:00:00 via INTRAVENOUS

## 2013-05-23 MED ORDER — DIPHENHYDRAMINE HCL 25 MG PO CAPS
25.0000 mg | ORAL_CAPSULE | Freq: Once | ORAL | Status: AC
  Administered 2013-05-23: 25 mg via ORAL

## 2013-05-23 MED ORDER — HEPARIN SOD (PORK) LOCK FLUSH 100 UNIT/ML IV SOLN
500.0000 [IU] | Freq: Once | INTRAVENOUS | Status: AC | PRN
  Administered 2013-05-23: 500 [IU]
  Filled 2013-05-23: qty 5

## 2013-05-23 MED ORDER — TRAMADOL HCL 50 MG PO TABS: 50.0000 mg | ORAL_TABLET | Freq: Four times a day (QID) | ORAL | Status: DC | PRN

## 2013-05-23 MED ORDER — SODIUM CHLORIDE 0.9 % IV SOLN
320.0000 mg | Freq: Once | INTRAVENOUS | Status: AC
  Administered 2013-05-23: 320 mg via INTRAVENOUS
  Filled 2013-05-23: qty 16

## 2013-05-23 MED ORDER — ACETAMINOPHEN 325 MG PO TABS
650.0000 mg | ORAL_TABLET | Freq: Once | ORAL | Status: AC
  Administered 2013-05-23: 650 mg via ORAL

## 2013-05-23 NOTE — Progress Notes (Signed)
Patient declines to stay for 30 minutes post infusion.

## 2013-05-23 NOTE — Progress Notes (Signed)
[facility]  [phone] [phone]  OFFICE PROGRESS NOTE   ID: [name]   DOB: Mar 14, 1956  MR#: [id number]  FAO#:[id number]   PCP: Billee Cashing, MD SU: [name], [name], [name] RAD ONG:EXBMWUX Kathrynn Running, [name]   HISTORY OF PRESENT ILLNESS: The patient originally presented in May 2003 when she noticed a lump in the upper inner quadrant of her right breast in September 2003.  She sought attention and had a mammogram which showed an obvious carcinoma in the upper inner quadrant of the right breast, approximately 2 cm.  There were some enlarged lymph nodes in the axilla and an FNA done showed those consistent with malignant cells, most likely an invasive ductal carcinoma.  At that point she was unsure of what to do and was referred by Dr. Francina Ames for a discussion of her treatment options.  By biopsy, it was ER/PR negative and HER-2 was 3+.  DNA index was 1.42.  We reiterated that it was most important to have her disease surgically addressed at that point and had recommended lumpectomy and axillary nodes to be addressed with which she followed through and on 04-03-02, Dr. Maple Hudson performed a right partial mastectomy and a right axillary lymph node dissection.  Final pathology revealed a 2.4 cm. high grade, Grade III invasive ductal carcinoma with an adjacent .8 cm. also high grade invasive ductal carcinoma which was felt to represent an intramammary metastasis rather than a second primary.  A smaller mass was just medial to the larger mass.  The smaller mass was associated with high grade DCIS component.  There was no definite lymphovascular invasion identified.  However, one of fourteen axillary lymph nodes did contain a 1.5 cm. metastatic deposit.  Postoperatively she did very well.  We reiterated the fact that she did need adjuvant chemotherapy, however, she refused and decided that she would pursue radiation.  She received radiation and completed  that on 08-14-02. Her subsequent history is as detailed below.  INTERVAL HISTORY: [name] returns today for follow up of her recurrent metastatic breast cancer.  Her interval history is significant for ongoing myalgias and arthralgias, particularly upper extremity discomfort.  She was seen by Dr. Gala Romney on 04/25/2013 and was granted cardiac clearance to continue TDM-1 therapy.  We again discussed smoking cessation during her office visit.  REVIEW OF SYSTEMS: [name] denies any fevers or chills. She denies hot flashes, night sweats and vaginal dryness. She denies any skin changes and has had no signs of abnormal bleeding.  She continues to have a productive cough.  She denies increased shortness of breath, or chest pain.  She has ongoing anxiety and depression.  She has chronic right upper extremity lymphedema. She has not experienced any unusual headaches.  Dr. Thea Silversmith continues to prescribe Opana and Percocet pain medication for her.  A detailed review of systems is otherwise stable and noncontributory.      PAST MEDICAL HISTORY: Past Medical History  Diagnosis Date  . DVT (deep venous thrombosis) 10/07/2011  . Breast cancer   . Hypertension   . Rheumatic fever     PAST SURGICAL HISTORY: Past Surgical History  Procedure Laterality Date  . Mastectomy      bilateral  . Breast surgery    . Port a cath revision      FAMILY HISTORY Family History  Problem Relation Age of Onset  . Pancreatic cancer Mother   . Kidney cancer Sister 20  . Breast cancer Sister  60  . Breast cancer Other 69  The patient's father died in his 57s  from complications of alcohol abuse. The patient's mother died in her 35s from pancreatic cancer. The patient has 6 brothers, 2 sisters. Both her sisters have had breast cancer, both diagnosed after the age of 98. The patient has not had genetic testing so far.   GYNECOLOGIC HISTORY: Menarche age 45; first live birth age 30. She is GXP4. She is not  sure when she stopped having periods. She never used hormone replacement.   SOCIAL HISTORY: The patient has worked in the past as a Agricultural engineer. At home in addition to the patient are her husband [name], originally from [place], who works as a Naval architect. Also son [name] and daughter [place] Pink (she is studying to be a Engineer, civil (consulting)). The other 2 children are [name], who has a college degree in psychology and works as a Clinical research associate; and [name], who lives in [place] and works as a Nurse, adult.   ADVANCED DIRECTIVES: Not in place  HEALTH MAINTENANCE: History  Substance Use Topics  . Smoking status: Light Tobacco Smoker  . Smokeless tobacco: Never Used  . Alcohol Use: Yes     Comment: Rarely     Colonoscopy:  Not on file  PAP:  Not on file  Bone density:  Not on file  Lipid panel:  Not on file  Allergies  Allergen Reactions  . Hydrocodone-Acetaminophen Itching and Rash    Current Outpatient Prescriptions  Medication Sig Dispense Refill  . alprazolam (XANAX) 2 MG tablet Take 2 mg by mouth daily.       Marland Kitchen amLODipine (NORVASC) 10 MG tablet Take 10 mg by mouth daily.        . Ascorbic Acid (VITAMIN C) 100 MG tablet Take 100 mg by mouth daily.        . calcium-vitamin D (OSCAL WITH D) 500-200 MG-UNIT per tablet Take 1 tablet by mouth daily.        Marland Kitchen gabapentin (NEURONTIN) 100 MG capsule TAKE 1 CAPSULE BY MOUTH AT BEDTIME  30 capsule  2  . LORazepam (ATIVAN) 0.5 MG tablet Take 0.5 mg by mouth every 8 (eight) hours as needed.       . metaxalone (SKELAXIN) 800 MG tablet Take 800 mg by mouth 3 (three) times daily.       . Multiple Vitamin (MULTIVITAMIN) capsule Take 1 capsule by mouth daily.        . mupirocin ointment (BACTROBAN) 2 % Apply 1 application topically as needed.       Marland Kitchen oxyCODONE-acetaminophen (PERCOCET) 10-325 MG per tablet Take 1 tablet by mouth every 6 (six) hours as needed.       Marland Kitchen oxymorphone (OPANA) 10 MG tablet Take 10 mg by mouth 2 (two) times daily.       . promethazine  (PHENERGAN) 25 MG tablet Take 1 tablet (25 mg total) by mouth every 6 (six) hours as needed for nausea.  30 tablet  0  . promethazine-codeine (PHENERGAN WITH CODEINE) 6.25-10 MG/5ML syrup po q 6-8 hrs prn cough  180 mL  0  . traMADol (ULTRAM) 50 MG tablet Take 1 tablet (50 mg total) by mouth every 6 (six) hours as needed.  30 tablet  0  . triamterene-hydrochlorothiazide (DYAZIDE) 37.5-25 MG per capsule Take 1 each (1 capsule total) by mouth every morning.  30 capsule  1   No current facility-administered medications for this visit.    OBJECTIVE: Middle-aged [place] American woman in  no acute distress Filed Vitals:   05/23/13 1110  BP: 119/76  Pulse: 83  Temp: 98.7 F (37.1 C)  Resp: 20     Body mass index is 28.82 kg/(m^2).    ECOG FS: 1 Filed Weights   05/23/13 1110  Weight: 178 lb 8 oz (80.967 kg)  O2 saturation 97% on room air  General appearance: Alert, cooperative, well nourished, no apparent distress Head: Normocephalic, without obvious abnormality, atraumatic, the patient is wearing a wig Eyes: Icteric sclerae, PERRLA, EOMI Nose: Nares, septum and mucosa are normal, no drainage or sinus tenderness Neck: No adenopathy, supple, symmetrical, trachea midline, no tenderness Resp: Clear to auscultation bilaterally Cardio: Regular rate and rhythm, S1, S2 normal, no murmur, click, rub or gallop, left chest Port-A-Cath is without signs of infection Breasts:  Deferred GI: Soft, distended, non-tender, hypoactive bowel sounds, no organomegaly Extremities: Extremities normal, atraumatic, no cyanosis, minimal right upper extremity lymphedema - the patient is wearing a compression sleeve Lymph nodes: Cervical, supraclavicular, and axillary nodes normal, no cervical masses palpated Neurologic: Grossly normal  LAB RESULTS: Lab Results  Component Value Date   WBC 6.3 05/23/2013   NEUTROABS 3.4 05/23/2013   HGB 15.6 05/23/2013   HCT 46.6 05/23/2013   MCV 86.1 05/23/2013   PLT 114*  05/23/2013      Chemistry      Component Value Date/Time   NA 139 05/23/2013 1032   NA 137 02/14/2013 1648   K 3.6 05/23/2013 1032   K 3.2* 02/14/2013 1648   CL 101 03/07/2013 1411   CL 97 02/14/2013 1648   CO2 26 05/23/2013 1032   CO2 29 02/14/2013 1648   BUN 17.5 05/23/2013 1032   BUN 10 02/14/2013 1648   CREATININE 0.8 05/23/2013 1032   CREATININE 0.68 02/14/2013 1648      Component Value Date/Time   CALCIUM 10.4 05/23/2013 1032   CALCIUM 9.8 02/14/2013 1648   ALKPHOS 126 05/23/2013 1032   ALKPHOS 124* 02/14/2013 1648   AST 46* 05/23/2013 1032   AST 59* 02/14/2013 1648   ALT 24 05/23/2013 1032   ALT 28 02/14/2013 1648   BILITOT 0.53 05/23/2013 1032   BILITOT 0.4 02/14/2013 1648      Lab Results  Component Value Date   LABCA2 37 10/05/2012    No results found for this basename: INR,  in the last 168 hours  STUDIES: Most recent echocardiogram on 04/23/2013 showed an ejection fraction of 55-60%.   ASSESSMENT: [name] is a 57 y.o. [place], [place] woman with stage IV breast cancer manifested chiefly by loco-regional nodal disease (neck, chest), without lung, liver, bone, or brain involvement.  (1) Status post right lumpectomy and sentinel lymph node sampling 03/04/2002 for 2 separate foci of invasive ductal carcinoma, mpT2 pN1 or stage IIB, both foci grade 3, both estrogen and progesterone receptor positive, both HercepTest 3+, Mib-1 56%  (2) Reexcision for margins 05/27/2002 showed no residual cancer in the breast.  (3) The patient refused adjuvant systemic therapy.  (4) Adjuvant radiation treatment completed 08/14/2002.  (5) Diffuse recurrence in the right breast in 02/2004 showing a morphologically different tumor, again grade 3, again estrogen and progesterone receptor negative, with an MIB-1 of 14% and Herceptest 3+.  (5) Between 03/2004 and 07/2004 she received dose dense Doxorubicin/Cyclophosphamide x 4 given with Herceptin, followed by weekly Docetaxel x 8, again  given with Herceptin.  (6) Right mastectomy 07/13/2004 showed scattered microscopic foci of residual disease over an area of greater than 5  cm. Margins were negative.  (7) Postoperative Docetaxel continued until 09/2004.  (8) Trastuzumab (Herceptin) given 08/2004 through 01/2012 with some brief interruptions.  (9) Isolated right cervical nodal recurrence 10/2005, treated with radiation to the right supraclavicular area (total 41.5 gray) completed 12/29/2005. (10) Navelbine given together with Herceptin  11/2005 through 03/2006.  (9) Left mastectomy 02/13/2006 for ductal carcinoma in situ, grade 2, estrogen and progesterone receptor negative, with negative margins; 0 of 3 lymph nodes involved  (10) The patient treated with Lapatinib and Capecitabine before 10/2009, for an unclear duration and with unclear results (cannot locate data on chart review).  (11) Status post right supraclavicular lymph node biopsy 09/2010 again positive for an invasive ductal carcinoma, estrogen and progesterone receptor negative, HER-2 positive by CISH with a ratio 4.25.  (12) Navelbine given together with Herceptin between 05/2011 and 11/2011.  (13) Carboplatin/Gemcitabine/Herceptin given for 2 cycles, in 12/2011 and 01/2012.  (14) TDM-1 (Kadcyla) started 02/2012. Most recent echo on 04/23/2013 showed a well preserved ejection fraction.  (15) Deep vein thrombosis of the right upper extremity documented 04/20/2011.  She completed anticoagulant therapy with Coumadin on 03/25/2013.  (16) Chronic right upper extremity lymphedema.  (17) Right chest port-a-cath removal due to infection on 01/28/2013.  (18)  Left chest Port-A-Cath placed on 04/08/2013.   PLAN: [name] is doing quite well given her history of stage IV breast cancer. She had been tolerating the TDM-1 with no evidence of cardiac toxicity. The plan is to continue TDM-1 every 3 weeks until we have clear evidence of disease progression.  Her  last TDM-1 infusion was on 03/07/2013 and she will resume TDM-1 infusions today (05/23/2013).   She last received cardiac clearance by Dr. Gala Romney on 04/25/2013 to continue TDM-1 therapy.  [name] is receiving pain medications from her primary care physician Dr. Ronne Binning.  She was provided with a written prescription today for Tramadol 50 mg one tablet by mouth Q6 hours as needed for pain, #30 with 0 refills.  We plan to see [name] again on 06/13/2013 when she is scheduled to continue every three-week TDM-1 therapy.  We will check laboratories of CBC and CMP during her next office visit  All questions answered.  [name] was encouraged to contact us in the interim with any questions, concerns, or problems.   Larina Bras  NP-C 05/24/2013 2:46 PM

## 2013-05-23 NOTE — Patient Instructions (Signed)
[facility] Discharge Instructions for Patients Receiving Chemotherapy  Today you received the following chemotherapy agents Kadcycla.  To help prevent nausea and vomiting after your treatment, we encourage you to take your nausea medication as prescribed.   If you develop nausea and vomiting that is not controlled by your nausea medication, call the clinic.   BELOW ARE SYMPTOMS THAT SHOULD BE REPORTED IMMEDIATELY:  *FEVER GREATER THAN 100.5 F  *CHILLS WITH OR WITHOUT FEVER  NAUSEA AND VOMITING THAT IS NOT CONTROLLED WITH YOUR NAUSEA MEDICATION  *UNUSUAL SHORTNESS OF BREATH  *UNUSUAL BRUISING OR BLEEDING  TENDERNESS IN MOUTH AND THROAT WITH OR WITHOUT PRESENCE OF ULCERS  *URINARY PROBLEMS  *BOWEL PROBLEMS  UNUSUAL RASH Items with * indicate a potential emergency and should be followed up as soon as possible.  Feel free to call the clinic you have any questions or concerns. The clinic phone number is [phone].

## 2013-05-23 NOTE — Patient Instructions (Addendum)
Please contact us at [phone] if you have any questions or concerns.  Please continue to do well and enjoy life!!!  Get plenty of rest, drink plenty of water, exercise daily (walking), eat a balanced diet.  Take Loratadine 10 mg daily.  Results for orders placed in visit on 05/23/13 (from the past 72 hour(s))  CBC WITH DIFFERENTIAL     Status: Abnormal   Collection Time    05/23/13 10:30 AM      Result Value Range   WBC 6.3  3.9 - 10.3 10e3/uL   NEUT# 3.4  1.5 - 6.5 10e3/uL   HGB 15.6  11.6 - 15.9 g/dL   HCT 16.1  09.6 - 04.5 %   Platelets 114 (*) 145 - 400 10e3/uL   MCV 86.1  79.5 - 101.0 fL   MCH 28.8  25.1 - 34.0 pg   MCHC 33.5  31.5 - 36.0 g/dL   RBC 4.09  8.11 - 9.14 10e6/uL   RDW 16.4 (*) 11.2 - 14.5 %   lymph# 2.2  0.9 - 3.3 10e3/uL   MONO# 0.6  0.1 - 0.9 10e3/uL   Eosinophils Absolute 0.1  0.0 - 0.5 10e3/uL   Basophils Absolute 0.0  0.0 - 0.1 10e3/uL   NEUT% 53.2  38.4 - 76.8 %   LYMPH% 35.1  14.0 - 49.7 %   MONO% 8.9  0.0 - 14.0 %   EOS% 2.2  0.0 - 7.0 %   BASO% 0.6  0.0 - 2.0 %   nRBC 0  0 - 0 %

## 2013-06-03 ENCOUNTER — Ambulatory Visit: Payer: Self-pay | Admitting: Internal Medicine

## 2013-06-13 ENCOUNTER — Ambulatory Visit: Payer: Commercial Managed Care - PPO | Admitting: Family

## 2013-06-13 ENCOUNTER — Telehealth: Payer: Self-pay | Admitting: *Deleted

## 2013-06-13 ENCOUNTER — Other Ambulatory Visit: Payer: Commercial Managed Care - PPO | Admitting: Lab

## 2013-06-13 ENCOUNTER — Telehealth: Payer: Self-pay | Admitting: Oncology

## 2013-06-13 ENCOUNTER — Ambulatory Visit: Payer: Commercial Managed Care - PPO

## 2013-06-13 NOTE — Telephone Encounter (Signed)
Pt called to check on status of r/s 8/7 lb/fu/tx. S/w desk nurse and pof had been sent to r/s pt ASAP. Checked w/chemo scheduler to see if tx could be added for tomorrow. tx can be added lb/fu scheduled for 10:30am and 11am. Chemo scheduler will add tx for 8/8 after 11am f/u. Pt aware.

## 2013-06-13 NOTE — Telephone Encounter (Signed)
Pt called this AM to state she was unaware of appointment today and is currently returning from being out of town, she is requesting to reschedule appts.  This RN placed a request per above and sent to scheduling.

## 2013-06-13 NOTE — Telephone Encounter (Signed)
Patient called in to reschedule appt for today, states she has been out of town and just got in and has to have one of her children drive her. She states if she could be r/s today, she could be here by 1:30pm. If tomorrow, it can be anytime. Informed patient that I would attempt to see when appts can be rescheduled, but trying to get a provider visit and chemo on same day on such short notice may be a problem. Patient verbalized understanding, informed her we would call back as soon as we can coordinate her appts.

## 2013-06-14 ENCOUNTER — Ambulatory Visit (HOSPITAL_BASED_OUTPATIENT_CLINIC_OR_DEPARTMENT_OTHER): Payer: Commercial Managed Care - PPO

## 2013-06-14 ENCOUNTER — Telehealth: Payer: Self-pay | Admitting: Family

## 2013-06-14 ENCOUNTER — Telehealth: Payer: Self-pay | Admitting: *Deleted

## 2013-06-14 ENCOUNTER — Other Ambulatory Visit: Payer: Self-pay | Admitting: Physician Assistant

## 2013-06-14 ENCOUNTER — Ambulatory Visit (HOSPITAL_BASED_OUTPATIENT_CLINIC_OR_DEPARTMENT_OTHER): Payer: Commercial Managed Care - PPO | Admitting: Family

## 2013-06-14 ENCOUNTER — Other Ambulatory Visit (HOSPITAL_BASED_OUTPATIENT_CLINIC_OR_DEPARTMENT_OTHER): Payer: Commercial Managed Care - PPO | Admitting: Lab

## 2013-06-14 ENCOUNTER — Encounter: Payer: Self-pay | Admitting: Family

## 2013-06-14 VITALS — BP 130/92 | HR 89 | Temp 98.5°F | Resp 18 | Ht 66.0 in | Wt 184.2 lb

## 2013-06-14 DIAGNOSIS — C50219 Malignant neoplasm of upper-inner quadrant of unspecified female breast: Secondary | ICD-10-CM

## 2013-06-14 DIAGNOSIS — R05 Cough: Secondary | ICD-10-CM

## 2013-06-14 DIAGNOSIS — R059 Cough, unspecified: Secondary | ICD-10-CM

## 2013-06-14 DIAGNOSIS — Z86718 Personal history of other venous thrombosis and embolism: Secondary | ICD-10-CM

## 2013-06-14 DIAGNOSIS — C778 Secondary and unspecified malignant neoplasm of lymph nodes of multiple regions: Secondary | ICD-10-CM

## 2013-06-14 DIAGNOSIS — Z170 Estrogen receptor positive status [ER+]: Secondary | ICD-10-CM

## 2013-06-14 DIAGNOSIS — C773 Secondary and unspecified malignant neoplasm of axilla and upper limb lymph nodes: Secondary | ICD-10-CM

## 2013-06-14 DIAGNOSIS — R110 Nausea: Secondary | ICD-10-CM

## 2013-06-14 DIAGNOSIS — Z5112 Encounter for antineoplastic immunotherapy: Secondary | ICD-10-CM

## 2013-06-14 DIAGNOSIS — C50919 Malignant neoplasm of unspecified site of unspecified female breast: Secondary | ICD-10-CM

## 2013-06-14 LAB — COMPREHENSIVE METABOLIC PANEL (CC13)
ALT: 38 U/L (ref 0–55)
AST: 72 U/L — ABNORMAL HIGH (ref 5–34)
Albumin: 3.7 g/dL (ref 3.5–5.0)
Alkaline Phosphatase: 140 U/L (ref 40–150)
BUN: 8.6 mg/dL (ref 7.0–26.0)
CO2: 27 mEq/L (ref 22–29)
Calcium: 10.4 mg/dL (ref 8.4–10.4)
Chloride: 102 mEq/L (ref 98–109)
Creatinine: 0.8 mg/dL (ref 0.6–1.1)
Glucose: 105 mg/dl (ref 70–140)
Potassium: 3.6 mEq/L (ref 3.5–5.1)
Sodium: 140 mEq/L (ref 136–145)
Total Bilirubin: 0.69 mg/dL (ref 0.20–1.20)
Total Protein: 8.4 g/dL — ABNORMAL HIGH (ref 6.4–8.3)

## 2013-06-14 LAB — CBC WITH DIFFERENTIAL/PLATELET
BASO%: 0.6 % (ref 0.0–2.0)
Basophils Absolute: 0 10*3/uL (ref 0.0–0.1)
EOS%: 4.3 % (ref 0.0–7.0)
Eosinophils Absolute: 0.2 10*3/uL (ref 0.0–0.5)
HCT: 47 % — ABNORMAL HIGH (ref 34.8–46.6)
HGB: 15.7 g/dL (ref 11.6–15.9)
LYMPH%: 43.6 % (ref 14.0–49.7)
MCH: 28.8 pg (ref 25.1–34.0)
MCHC: 33.4 g/dL (ref 31.5–36.0)
MCV: 86.1 fL (ref 79.5–101.0)
MONO#: 0.7 10*3/uL (ref 0.1–0.9)
MONO%: 12.3 % (ref 0.0–14.0)
NEUT#: 2.1 10*3/uL (ref 1.5–6.5)
NEUT%: 39.2 % (ref 38.4–76.8)
Platelets: 128 10*3/uL — ABNORMAL LOW (ref 145–400)
RBC: 5.46 10*6/uL — ABNORMAL HIGH (ref 3.70–5.45)
RDW: 15.5 % — ABNORMAL HIGH (ref 11.2–14.5)
WBC: 5.4 10*3/uL (ref 3.9–10.3)
lymph#: 2.3 10*3/uL (ref 0.9–3.3)
nRBC: 0 % (ref 0–0)

## 2013-06-14 MED ORDER — SODIUM CHLORIDE 0.9 % IV SOLN
320.0000 mg | Freq: Once | INTRAVENOUS | Status: AC
  Administered 2013-06-14: 320 mg via INTRAVENOUS
  Filled 2013-06-14: qty 16

## 2013-06-14 MED ORDER — ACETAMINOPHEN 325 MG PO TABS
650.0000 mg | ORAL_TABLET | Freq: Once | ORAL | Status: AC
  Administered 2013-06-14: 650 mg via ORAL

## 2013-06-14 MED ORDER — PROMETHAZINE-CODEINE 6.25-10 MG/5ML PO SYRP: ORAL_SOLUTION | ORAL | Status: DC

## 2013-06-14 MED ORDER — DIPHENHYDRAMINE HCL 25 MG PO CAPS
25.0000 mg | ORAL_CAPSULE | Freq: Once | ORAL | Status: AC
  Administered 2013-06-14: 25 mg via ORAL

## 2013-06-14 MED ORDER — SODIUM CHLORIDE 0.9 % IV SOLN
Freq: Once | INTRAVENOUS | Status: AC
  Administered 2013-06-14: 12:00:00 via INTRAVENOUS

## 2013-06-14 MED ORDER — SODIUM CHLORIDE 0.9 % IJ SOLN
10.0000 mL | INTRAMUSCULAR | Status: DC | PRN
  Administered 2013-06-14: 10 mL
  Filled 2013-06-14: qty 10

## 2013-06-14 MED ORDER — HEPARIN SOD (PORK) LOCK FLUSH 100 UNIT/ML IV SOLN
500.0000 [IU] | Freq: Once | INTRAVENOUS | Status: AC | PRN
  Administered 2013-06-14: 500 [IU]
  Filled 2013-06-14: qty 5

## 2013-06-14 NOTE — Patient Instructions (Addendum)
Please contact us at [phone] if you have any questions or concerns.  Please continue to do well and enjoy life!!!  Get plenty of rest, drink plenty of water, exercise daily (walking), eat a balanced diet.  Results for orders placed in visit on 06/14/13 (from the past 72 hour(s))  CBC WITH DIFFERENTIAL     Status: Abnormal   Collection Time    06/14/13 10:30 AM      Result Value Range   WBC 5.4  3.9 - 10.3 10e3/uL   NEUT# 2.1  1.5 - 6.5 10e3/uL   HGB 15.7  11.6 - 15.9 g/dL   HCT 04.5 (*) 40.9 - 81.1 %   Platelets 128 (*) 145 - 400 10e3/uL   MCV 86.1  79.5 - 101.0 fL   MCH 28.8  25.1 - 34.0 pg   MCHC 33.4  31.5 - 36.0 g/dL   RBC 9.14 (*) 7.82 - 9.56 10e6/uL   RDW 15.5 (*) 11.2 - 14.5 %   lymph# 2.3  0.9 - 3.3 10e3/uL   MONO# 0.7  0.1 - 0.9 10e3/uL   Eosinophils Absolute 0.2  0.0 - 0.5 10e3/uL   Basophils Absolute 0.0  0.0 - 0.1 10e3/uL   NEUT% 39.2  38.4 - 76.8 %   LYMPH% 43.6  14.0 - 49.7 %   MONO% 12.3  0.0 - 14.0 %   EOS% 4.3  0.0 - 7.0 %   BASO% 0.6  0.0 - 2.0 %   nRBC 0  0 - 0 %

## 2013-06-14 NOTE — Progress Notes (Signed)
Patient does not want to stay for 30 minute post observation.

## 2013-06-14 NOTE — Progress Notes (Signed)
[facility]  [phone] [phone]  OFFICE PROGRESS NOTE   ID: [name]   DOB: [birth year]  MR#: [id number]  NGE#:[id number]   PCP: Billee Cashing, MD SU: [name], [name], [name] RAD MWN:[id number] Kathrynn Running, [name]   HISTORY OF PRESENT ILLNESS: The patient originally presented in May 2003 when she noticed a lump in the upper inner quadrant of her right breast in September 2003.  She sought attention and had a mammogram which showed an obvious carcinoma in the upper inner quadrant of the right breast, approximately 2 cm.  There were some enlarged lymph nodes in the axilla and an FNA done showed those consistent with malignant cells, most likely an invasive ductal carcinoma.  At that point she was unsure of what to do and was referred by Dr. Francina Ames for a discussion of her treatment options.  By biopsy, it was ER/PR negative and HER-2 was 3+.  DNA index was 1.42.  We reiterated that it was most important to have her disease surgically addressed at that point and had recommended lumpectomy and axillary nodes to be addressed with which she followed through and on 04-03-02, Dr. Maple Hudson performed a right partial mastectomy and a right axillary lymph node dissection.  Final pathology revealed a 2.4 cm. high grade, Grade III invasive ductal carcinoma with an adjacent .8 cm. also high grade invasive ductal carcinoma which was felt to represent an intramammary metastasis rather than a second primary.  A smaller mass was just medial to the larger mass.  The smaller mass was associated with high grade DCIS component.  There was no definite lymphovascular invasion identified.  However, one of fourteen axillary lymph nodes did contain a 1.5 cm. metastatic deposit.  Postoperatively she did very well.  We reiterated the fact that she did need adjuvant chemotherapy, however, she refused and decided that she would pursue radiation.  She received radiation and completed  that on 08-14-02. Her subsequent history is as detailed below.   INTERVAL HISTORY: [name] returns today for follow up of her recurrent metastatic breast cancer.  She is accompanied for today's office visit by her daughter [name].  Her interval history is significant for traveling out of town to [place] for a family reunion, but returning too late make her infusion appointment yesterday 06/13/2013.  She will receive her TD M.-1 infusion today instead.  We again discussed smoking cessation during her office visit.   REVIEW OF SYSTEMS: [name]  complains of ongoing myalgias and arthralgias, particularly upper extremity discomfort.  She denies any fevers or chills. She denies hot flashes, night sweats and vaginal dryness. She denies any skin changes and has had no signs of abnormal bleeding.  She continues to have a productive cough.  She denies increased shortness of breath, or chest pain.  She has ongoing anxiety and depression.  She has chronic right upper extremity lymphedema. She has not experienced any unusual headaches.  Dr. Thea Silversmith continues to prescribe Opana and Percocet pain medication for her.  A detailed review of systems is otherwise stable and noncontributory.      PAST MEDICAL HISTORY: Past Medical History  Diagnosis Date  . DVT (deep venous thrombosis) 10/07/2011  . Breast cancer   . Hypertension   . Rheumatic fever     PAST SURGICAL HISTORY: Past Surgical History  Procedure Laterality Date  . Mastectomy      bilateral  . Breast surgery    . Port a cath revision  FAMILY HISTORY Family History  Problem Relation Age of Onset  . Pancreatic cancer Mother   . Kidney cancer Sister 6  . Breast cancer Sister 39  . Breast cancer Other 75  The patient's father died in his 85s  from complications of alcohol abuse. The patient's mother died in her 40s from pancreatic cancer. The patient has 6 brothers, 2 sisters. Both her sisters have had breast cancer, both  diagnosed after the age of 52. The patient has not had genetic testing so far.   GYNECOLOGIC HISTORY: Menarche age 57; first live birth age 57. She is GXP4. She is not sure when she stopped having periods. She never used hormone replacement.   SOCIAL HISTORY: The patient has worked in the past as a Agricultural engineer. At home in addition to the patient are her husband [name], originally from [place], who works as a Naval architect.  Also living in the home is her son [name] and her daughter [name] (she is studying to be a Engineer, civil (consulting)).  The other 2 children are [name], who has a college degree in psychology and works as a Clinical research associate; and [name], who lives in  and works as a Nurse, adult.   ADVANCED DIRECTIVES: Not in place  HEALTH MAINTENANCE: History  Substance Use Topics  . Smoking status: Light Tobacco Smoker  . Smokeless tobacco: Never Used  . Alcohol Use: Yes     Comment: Rarely     Colonoscopy:  Not on file  PAP:  Not on file  Bone density:  Not on file  Lipid panel:  Not on file  Allergies  Allergen Reactions  . Hydrocodone-Acetaminophen Itching and Rash    Current Outpatient Prescriptions  Medication Sig Dispense Refill  . alprazolam (XANAX) 2 MG tablet Take 2 mg by mouth daily.       Marland Kitchen amLODipine (NORVASC) 10 MG tablet Take 10 mg by mouth daily.        . Ascorbic Acid (VITAMIN C) 100 MG tablet Take 100 mg by mouth daily.        . calcium-vitamin D (OSCAL WITH D) 500-200 MG-UNIT per tablet Take 1 tablet by mouth daily.        Marland Kitchen gabapentin (NEURONTIN) 100 MG capsule TAKE 1 CAPSULE BY MOUTH AT BEDTIME  30 capsule  2  . LORazepam (ATIVAN) 0.5 MG tablet Take 0.5 mg by mouth every 8 (eight) hours as needed.       . metaxalone (SKELAXIN) 800 MG tablet Take 800 mg by mouth 3 (three) times daily.       . Multiple Vitamin (MULTIVITAMIN) capsule Take 1 capsule by mouth daily.        . mupirocin ointment (BACTROBAN) 2 % Apply 1 application topically as needed.       Marland Kitchen  oxyCODONE-acetaminophen (PERCOCET) 10-325 MG per tablet Take 1 tablet by mouth every 6 (six) hours as needed.       Marland Kitchen oxymorphone (OPANA) 10 MG tablet Take 10 mg by mouth 2 (two) times daily.       . promethazine (PHENERGAN) 25 MG tablet Take 1 tablet (25 mg total) by mouth every 6 (six) hours as needed for nausea.  30 tablet  0  . promethazine-codeine (PHENERGAN WITH CODEINE) 6.25-10 MG/5ML syrup po q 6-8 hrs prn cough  240 mL  0  . traMADol (ULTRAM) 50 MG tablet Take 1 tablet (50 mg total) by mouth every 6 (six) hours as needed.  30 tablet  0  . triamterene-hydrochlorothiazide (DYAZIDE) 37.5-25  MG per capsule Take 1 each (1 capsule total) by mouth every morning.  30 capsule  1   No current facility-administered medications for this visit.   Facility-Administered Medications Ordered in Other Visits  Medication Dose Route Frequency Provider Last Rate Last Dose  . sodium chloride 0.9 % injection 10 mL  10 mL Intracatheter PRN Lowella Dell, MD   10 mL at 06/14/13 1331    OBJECTIVE: Middle-aged [place] American woman in no acute distress Filed Vitals:   06/14/13 1049  BP: 130/92  Pulse: 89  Temp: 98.5 F (36.9 C)  Resp: 18     Body mass index is 29.75 kg/(m^2).    ECOG FS: 1 Filed Weights   06/14/13 1049  Weight: 184 lb 3.2 oz (83.553 kg)   General appearance: Alert, cooperative, well nourished, no apparent distress Head: Normocephalic, without obvious abnormality, atraumatic, the patient is wearing a wig Eyes: Icteric sclerae, PERRLA, EOMI Nose: Nares, septum and mucosa are normal, no drainage or sinus tenderness Neck: No adenopathy, supple, symmetrical, trachea midline, no tenderness Resp: Clear to auscultation bilaterally Cardio: Regular rate and rhythm, S1, S2 normal, no murmur, click, rub or gallop, left chest Port-A-Cath is without signs of infection Breasts:  Deferred GI: Soft, distended, non-tender, hypoactive bowel sounds Extremities: Extremities normal,  atraumatic, no cyanosis, minimal right upper extremity lymphedema - the patient is wearing a compression sleeve Lymph nodes: Cervical, supraclavicular, and axillary nodes normal, no cervical masses palpated Neurologic: Grossly normal   LAB RESULTS: Lab Results  Component Value Date   WBC 5.4 06/14/2013   NEUTROABS 2.1 06/14/2013   HGB 15.7 06/14/2013   HCT 47.0* 06/14/2013   MCV 86.1 06/14/2013   PLT 128* 06/14/2013      Chemistry      Component Value Date/Time   NA 140 06/14/2013 1031   NA 137 02/14/2013 1648   K 3.6 06/14/2013 1031   K 3.2* 02/14/2013 1648   CL 101 03/07/2013 1411   CL 97 02/14/2013 1648   CO2 27 06/14/2013 1031   CO2 29 02/14/2013 1648   BUN 8.6 06/14/2013 1031   BUN 10 02/14/2013 1648   CREATININE 0.8 06/14/2013 1031   CREATININE 0.68 02/14/2013 1648      Component Value Date/Time   CALCIUM 10.4 06/14/2013 1031   CALCIUM 9.8 02/14/2013 1648   ALKPHOS 140 06/14/2013 1031   ALKPHOS 124* 02/14/2013 1648   AST 72* 06/14/2013 1031   AST 59* 02/14/2013 1648   ALT 38 06/14/2013 1031   ALT 28 02/14/2013 1648   BILITOT 0.69 06/14/2013 1031   BILITOT 0.4 02/14/2013 1648      Lab Results  Component Value Date   LABCA2 37 10/05/2012    No results found for this basename: INR,  in the last 168 hours  STUDIES: Most recent echocardiogram on 04/23/2013 showed an ejection fraction of 55-60%.   ASSESSMENT: Mrs. Tally is a 57 y.o. [place], [place] woman with stage IV breast cancer manifested chiefly by loco-regional nodal disease (neck, chest), without lung, liver, bone, or brain involvement.  (1) Status post right lumpectomy and sentinel lymph node sampling 03/04/2002 for 2 separate foci of invasive ductal carcinoma, mpT2 pN1 or stage IIB, both foci grade 3, both estrogen and progesterone receptor positive, both HercepTest 3+, Mib-1 56%  (2) Reexcision for margins 05/27/2002 showed no residual cancer in the breast.  (3) The patient refused adjuvant systemic therapy.  (4) Adjuvant  radiation treatment completed 08/14/2002.  (5) Diffuse recurrence in  the right breast in 02/2004 showing a morphologically different tumor, again grade 3, again estrogen and progesterone receptor negative, with an MIB-1 of 14% and Herceptest 3+.  (5) Between 03/2004 and 07/2004 she received dose dense Doxorubicin/Cyclophosphamide x 4 given with Herceptin, followed by weekly Docetaxel x 8, again given with Herceptin.  (6) Right mastectomy 07/13/2004 showed scattered microscopic foci of residual disease over an area of greater than 5 cm. Margins were negative.  (7) Postoperative Docetaxel continued until 09/2004.  (8) Trastuzumab (Herceptin) given 08/2004 through 01/2012 with some brief interruptions.  (9) Isolated right cervical nodal recurrence 10/2005, treated with radiation to the right supraclavicular area (total 41.5 gray) completed 12/29/2005. (10) Navelbine given together with Herceptin  11/2005 through 03/2006.  (9) Left mastectomy 02/13/2006 for ductal carcinoma in situ, grade 2, estrogen and progesterone receptor negative, with negative margins; 0 of 3 lymph nodes involved  (10) The patient treated with Lapatinib and Capecitabine before 10/2009, for an unclear duration and with unclear results (cannot locate data on chart review).  (11) Status post right supraclavicular lymph node biopsy 09/2010 again positive for an invasive ductal carcinoma, estrogen and progesterone receptor negative, HER-2 positive by CISH with a ratio 4.25.  (12) Navelbine given together with Herceptin between 05/2011 and 11/2011.  (13) Carboplatin/Gemcitabine/Herceptin given for 2 cycles, in 12/2011 and 01/2012.  (14) TDM-1 (Kadcyla) started 02/2012. Most recent echo on 04/23/2013 showed a well preserved ejection fraction.  (15) Deep vein thrombosis of the right upper extremity documented 04/20/2011.  She completed anticoagulant therapy with Coumadin on 03/25/2013.  (16) Chronic right upper extremity  lymphedema.  (17) Right chest port-a-cath removal due to infection on 01/28/2013.  (18)  Left chest Port-A-Cath placed on 04/08/2013.   PLAN: [name] is doing quite well given her history of stage IV breast cancer. She had been tolerating the TDM-1 with no evidence of cardiac toxicity. The plan is to continue TDM-1 every 3 weeks until we have clear evidence of disease progression.  Her last TDM-1 infusion was on 05/23/2013.  She last received cardiac clearance by Dr. Gala Romney on 04/25/2013 to continue TDM-1 therapy.  Mrs. Koury is receiving pain medications from her primary care physician Dr. Ronne Binning.  She was provided with a written prescription today for Phenergan with codeine cough syrup # 240 mL with 0 refills and instructions to take 5 mL by mouth every 6-8 hours when necessary for cough.  We plan to see Mrs. Provence again on 07/04/2013 when she is scheduled to continue every three-week TDM-1 therapy.  We will check laboratories of CBC and CMP during her next office visit.  I will schedule the patient for a 2-D echocardiogram with Dr. Gala Romney before her 07/25/2013 infusion.  Plan of care and laboratory results discussed with Dr. Darnelle Catalan.    All questions answered.  Mrs. Miguez was encouraged to contact us in the interim with any questions, concerns, or problems.   Larina Bras  NP-C 06/14/2013 2:55 PM

## 2013-06-14 NOTE — Telephone Encounter (Signed)
appts made and printed. Pt is aware that tx will be add for 9/18 and 10/9. i emailed MB to add the tx's. sw Amy at [facility] office and she stated that he said for the pt to be seen in 3 months  However she plans to past it on for Shantell to look at the order and give me a call. The pt is aware...td

## 2013-06-14 NOTE — Telephone Encounter (Signed)
Letter sent to patient from NP J. Hunter

## 2013-06-14 NOTE — Patient Instructions (Addendum)
[facility] Discharge Instructions for Patients Receiving Chemotherapy  Today you received the following chemotherapy agents: Kadcyla  To help prevent nausea and vomiting after your treatment, we encourage you to take your nausea medication as prescribed.   If you develop nausea and vomiting that is not controlled by your nausea medication, call the clinic.   BELOW ARE SYMPTOMS THAT SHOULD BE REPORTED IMMEDIATELY:  *FEVER GREATER THAN 100.5 F  *CHILLS WITH OR WITHOUT FEVER  NAUSEA AND VOMITING THAT IS NOT CONTROLLED WITH YOUR NAUSEA MEDICATION  *UNUSUAL SHORTNESS OF BREATH  *UNUSUAL BRUISING OR BLEEDING  TENDERNESS IN MOUTH AND THROAT WITH OR WITHOUT PRESENCE OF ULCERS  *URINARY PROBLEMS  *BOWEL PROBLEMS  UNUSUAL RASH Items with * indicate a potential emergency and should be followed up as soon as possible.  Feel free to call the clinic you have any questions or concerns. The clinic phone number is [phone].    

## 2013-06-17 ENCOUNTER — Telehealth: Payer: Self-pay | Admitting: *Deleted

## 2013-06-17 NOTE — Telephone Encounter (Signed)
gv appt for echo and [name] for 07/18/13 @ 10am...td

## 2013-06-19 ENCOUNTER — Telehealth: Payer: Self-pay | Admitting: *Deleted

## 2013-06-19 NOTE — Telephone Encounter (Signed)
Per staff message and POF I have scheduled appts.  JMW  

## 2013-06-21 IMAGING — PT NM PET TUM IMG RESTAG (PS) SKULL BASE T - THIGH
6 series · 25 of 25 positions shown · non-contrast
Comparison: 11/05/2012

CLINICAL DATA: Subsequent treatment strategy for metastatic breast
cancer.

NUCLEAR MEDICINE PET SKULL BASE TO THIGH
Fasting Blood Glucose:  95
TECHNIQUE: 17.2 mCi F-18 FDG was injected intravenously. CT data
was obtained and used for attenuation correction and anatomic
localization only.  (This was not acquired as a diagnostic CT
examination.) Additional exam technical data entered on
technologist worksheet.

[Series 1: pet ac · axial · 3.3mm · 4.69mm/px · z∈[-726,+0]mm · 5 of 223 slices shown]
[im 1/223]
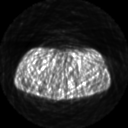
[im 56/223]
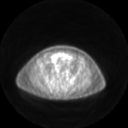
[im 112/223]
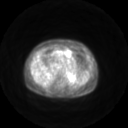
[im 167/223]
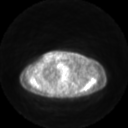
[im 223/223]
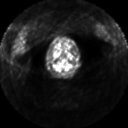

[Series 2: ct images · axial · 3.8mm · 0.98mm/px · z∈[-726,+0]mm · 5 of 223 slices shown]
[im 1/223]
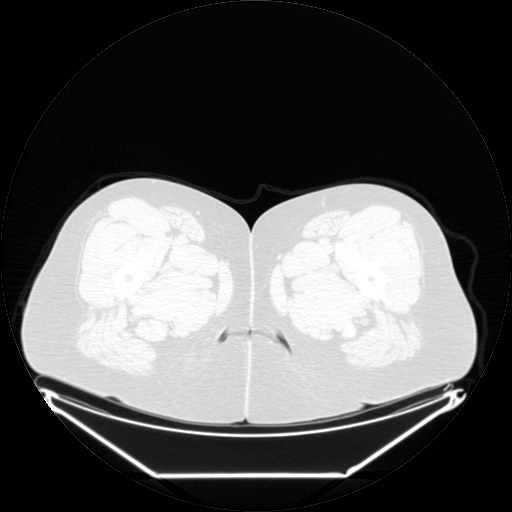
[im 56/223]
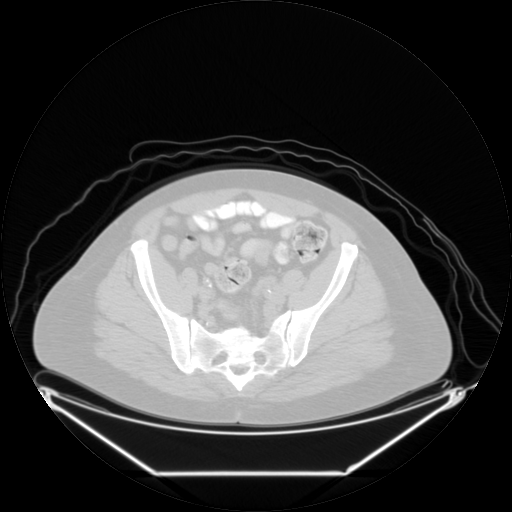
[im 112/223]
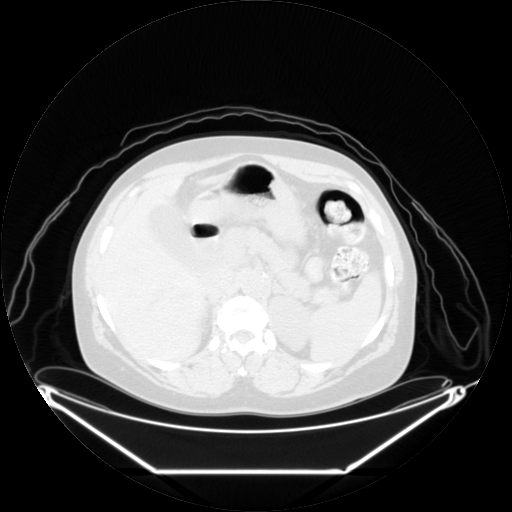
[im 167/223]
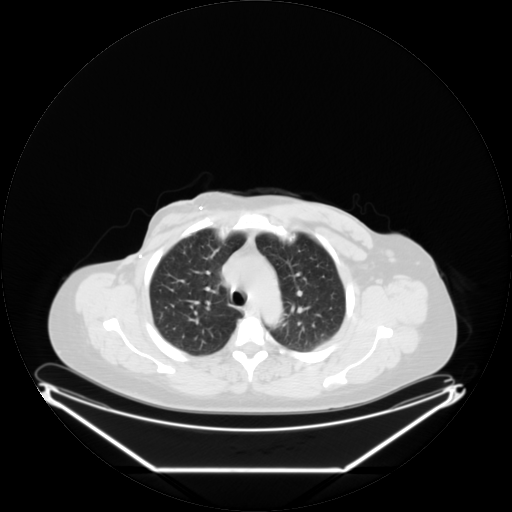
[im 223/223]
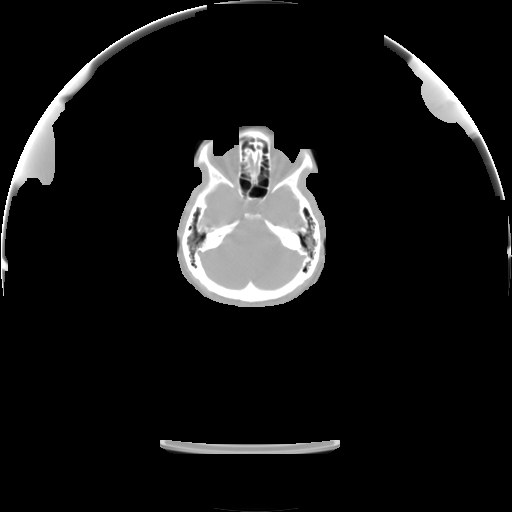

[Series 2: pet nac · axial · 3.3mm · 4.69mm/px · z∈[-726,+0]mm · 6 of 223 slices shown]
[im 1/223]
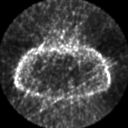
[im 45/223]
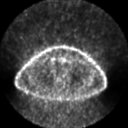
[im 89/223]
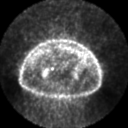
[im 134/223]
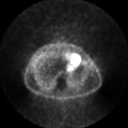
[im 178/223]
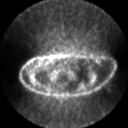
[im 223/223]
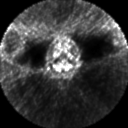

[Series 123: mip · coronal · 3.3mm · 4.69mm/px · 1 of 30 slices shown]
[im 1/30]
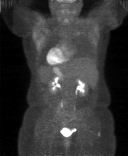

[Series 151: reformatted · axial · 3.3mm · 3.91mm/px · z∈[-726,+0]mm · 6 of 221 slices shown (1 of 2)]
[im 1/221]
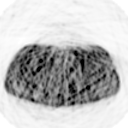
[im 45/221]
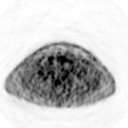
[im 89/221]
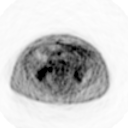
[im 133/221]
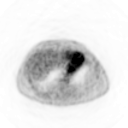
[im 177/221]
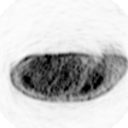
[im 221/221]
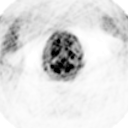

[Series 153: reformatted · coronal · 4.7mm · 5.83mm/px · 2 of 75 slices shown (2 of 2)]
[im 1/75]
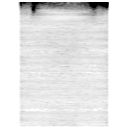
[im 75/75]
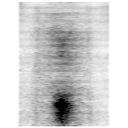

[25 of 25 positions shown; findings below may reference images not displayed]

FINDINGS: Neck: Right posterior cervical lymph node has a short axis diameter
of 1.5 cm.  There is associated increased FDG uptake associated
this lymph node within SUV max equal to 5..

Chest:  Post surgical change in the right chest wall from removal
of Port-A-Cath noted with associated increased FDG uptake.  No
hypermetabolic axillary or supraclavicular lymph nodes.  No
hypermetabolic mediastinal or hilar nodes.  No suspicious pulmonary
nodules on the CT scan.

Abdomen/Pelvis:  No abnormal hypermetabolic activity within the
liver, pancreas, adrenal glands, or spleen.  No hypermetabolic
lymph nodes in the abdomen or pelvis.

Skeleton:  No focal hypermetabolic activity to suggest skeletal
metastasis.
IMPRESSION: 1.  No acute findings.
2.  There are no findings highly suspicious for metastatic disease.
3.  There is moderate increased uptake associated with the right
posterior cervical lymph node.  The SUV max is equal to five.
Cannot rule out lymph node metastasis. If there is a clinical
concern for recurrent metastatic disease consider ultrasound of the
neck or contrast enhanced CT of the soft tissues of the neck.

## 2013-06-25 ENCOUNTER — Telehealth: Payer: Self-pay | Admitting: Emergency Medicine

## 2013-06-25 ENCOUNTER — Other Ambulatory Visit: Payer: Self-pay | Admitting: Physician Assistant

## 2013-06-25 ENCOUNTER — Telehealth: Payer: Self-pay | Admitting: *Deleted

## 2013-06-25 ENCOUNTER — Ambulatory Visit (HOSPITAL_COMMUNITY)
Admission: RE | Admit: 2013-06-25 | Discharge: 2013-06-25 | Disposition: A | Discharge status: Home / Self Care | Payer: Commercial Managed Care - PPO | Source: Ambulatory Visit | Attending: Oncology | Admitting: Oncology

## 2013-06-25 ENCOUNTER — Other Ambulatory Visit: Payer: Self-pay | Admitting: Emergency Medicine

## 2013-06-25 DIAGNOSIS — C50919 Malignant neoplasm of unspecified site of unspecified female breast: Secondary | ICD-10-CM

## 2013-06-25 DIAGNOSIS — R600 Localized edema: Secondary | ICD-10-CM

## 2013-06-25 DIAGNOSIS — M79609 Pain in unspecified limb: Secondary | ICD-10-CM | POA: Insufficient documentation

## 2013-06-25 NOTE — Telephone Encounter (Signed)
Received a voicemaill from patient today at 11:21 in reference to right arm pain.  Patient states that her arm hurts, denies swelling, states "I can't see the green veins."  Patient asked "do I need an antibiotic?" "Do I need to be seen?"  Returned patient's call and she asks, "Do I need antibiotics to hep make my arm stop hurting?"  Patient denies swelling in either arm. When asked how her cough is she states, "its ok."  Asked patient which arm is bothering her and she states her right arm.  Then when speaking to patient again to set up a plan she states, "both hurt at night and the right arm hurts during the day also."  Patient scheduled for a doppler study for the right arm, which was negative for DVT. Will follow up with patient, she has appointment with Dr Darnelle Catalan on 8/28.

## 2013-06-25 NOTE — Telephone Encounter (Signed)
PT. HAS A NON PRODUCTIVE COUGH. HER CHEST FEELS TIGHT. NO FEVER. SHE ALSO MENTIONED HER RIGHT ARM HURTS "ALOT". THIS NOTE GIVEN TO DR.[name]'S NURSE, MEREDITH WALTON,RN.

## 2013-06-25 NOTE — Progress Notes (Signed)
*  Preliminary Results* Right upper extremity venous duplex completed. Right upper extremity is negative for deep and superficial vein thrombosis.  Preliminary results discussed with Sharyl Nimrod of Dr.[name]'s office.  06/25/2013 4:38 PM  Gertie Fey, RVT, RDCS, RDMS

## 2013-06-26 ENCOUNTER — Telehealth: Payer: Self-pay | Admitting: Emergency Medicine

## 2013-06-26 NOTE — Telephone Encounter (Signed)
Called patient to inform her of negative doppler study on right arm.  Patient states feeling much better and denies pain since yesterday.  Reminded patient to keep appointment for 8/28 at 11:00.   Patient verbalized understanding.

## 2013-07-04 ENCOUNTER — Telehealth: Payer: Self-pay | Admitting: *Deleted

## 2013-07-04 ENCOUNTER — Ambulatory Visit (HOSPITAL_BASED_OUTPATIENT_CLINIC_OR_DEPARTMENT_OTHER): Payer: Commercial Managed Care - PPO | Admitting: Oncology

## 2013-07-04 ENCOUNTER — Ambulatory Visit (HOSPITAL_BASED_OUTPATIENT_CLINIC_OR_DEPARTMENT_OTHER): Payer: Commercial Managed Care - PPO

## 2013-07-04 ENCOUNTER — Other Ambulatory Visit (HOSPITAL_BASED_OUTPATIENT_CLINIC_OR_DEPARTMENT_OTHER): Payer: Commercial Managed Care - PPO | Admitting: Lab

## 2013-07-04 VITALS — BP 136/84 | HR 91 | Temp 98.2°F | Resp 20 | Ht 66.0 in | Wt 195.7 lb

## 2013-07-04 DIAGNOSIS — C50919 Malignant neoplasm of unspecified site of unspecified female breast: Secondary | ICD-10-CM

## 2013-07-04 DIAGNOSIS — C50219 Malignant neoplasm of upper-inner quadrant of unspecified female breast: Secondary | ICD-10-CM

## 2013-07-04 DIAGNOSIS — C778 Secondary and unspecified malignant neoplasm of lymph nodes of multiple regions: Secondary | ICD-10-CM

## 2013-07-04 DIAGNOSIS — Z5112 Encounter for antineoplastic immunotherapy: Secondary | ICD-10-CM

## 2013-07-04 LAB — COMPREHENSIVE METABOLIC PANEL (CC13)
ALT: 38 U/L (ref 0–55)
AST: 78 U/L — ABNORMAL HIGH (ref 5–34)
Albumin: 3.5 g/dL (ref 3.5–5.0)
Alkaline Phosphatase: 145 U/L (ref 40–150)
BUN: 9.4 mg/dL (ref 7.0–26.0)
CO2: 29 mEq/L (ref 22–29)
Calcium: 9.7 mg/dL (ref 8.4–10.4)
Chloride: 103 mEq/L (ref 98–109)
Creatinine: 0.8 mg/dL (ref 0.6–1.1)
Glucose: 116 mg/dl (ref 70–140)
Potassium: 3.5 mEq/L (ref 3.5–5.1)
Sodium: 143 mEq/L (ref 136–145)
Total Bilirubin: 0.68 mg/dL (ref 0.20–1.20)
Total Protein: 7.6 g/dL (ref 6.4–8.3)

## 2013-07-04 LAB — CBC WITH DIFFERENTIAL/PLATELET
BASO%: 0.9 % (ref 0.0–2.0)
Basophils Absolute: 0.1 10*3/uL (ref 0.0–0.1)
EOS%: 5 % (ref 0.0–7.0)
Eosinophils Absolute: 0.3 10*3/uL (ref 0.0–0.5)
HCT: 40.9 % (ref 34.8–46.6)
HGB: 13.3 g/dL (ref 11.6–15.9)
LYMPH%: 38.1 % (ref 14.0–49.7)
MCH: 28.5 pg (ref 25.1–34.0)
MCHC: 32.5 g/dL (ref 31.5–36.0)
MCV: 87.6 fL (ref 79.5–101.0)
MONO#: 0.8 10*3/uL (ref 0.1–0.9)
MONO%: 14.1 % — ABNORMAL HIGH (ref 0.0–14.0)
NEUT#: 2.3 10*3/uL (ref 1.5–6.5)
NEUT%: 41.9 % (ref 38.4–76.8)
Platelets: 116 10*3/uL — ABNORMAL LOW (ref 145–400)
RBC: 4.67 10*6/uL (ref 3.70–5.45)
RDW: 15.6 % — ABNORMAL HIGH (ref 11.2–14.5)
WBC: 5.4 10*3/uL (ref 3.9–10.3)
lymph#: 2.1 10*3/uL (ref 0.9–3.3)
nRBC: 0 % (ref 0–0)

## 2013-07-04 MED ORDER — DIPHENHYDRAMINE HCL 25 MG PO CAPS
25.0000 mg | ORAL_CAPSULE | Freq: Once | ORAL | Status: AC
  Administered 2013-07-04: 25 mg via ORAL

## 2013-07-04 MED ORDER — TRIAMTERENE-HCTZ 37.5-25 MG PO CAPS: 1.0000 | ORAL_CAPSULE | ORAL | Status: DC

## 2013-07-04 MED ORDER — SODIUM CHLORIDE 0.9 % IJ SOLN
10.0000 mL | INTRAMUSCULAR | Status: DC | PRN
  Administered 2013-07-04: 10 mL
  Filled 2013-07-04: qty 10

## 2013-07-04 MED ORDER — ACETAMINOPHEN 325 MG PO TABS
650.0000 mg | ORAL_TABLET | Freq: Once | ORAL | Status: AC
  Administered 2013-07-04: 650 mg via ORAL

## 2013-07-04 MED ORDER — HEPARIN SOD (PORK) LOCK FLUSH 100 UNIT/ML IV SOLN
500.0000 [IU] | Freq: Once | INTRAVENOUS | Status: AC | PRN
  Administered 2013-07-04: 500 [IU]
  Filled 2013-07-04: qty 5

## 2013-07-04 MED ORDER — SODIUM CHLORIDE 0.9 % IV SOLN
320.0000 mg | Freq: Once | INTRAVENOUS | Status: AC
  Administered 2013-07-04: 320 mg via INTRAVENOUS
  Filled 2013-07-04: qty 16

## 2013-07-04 MED ORDER — SODIUM CHLORIDE 0.9 % IV SOLN
Freq: Once | INTRAVENOUS | Status: AC
  Administered 2013-07-04: 13:00:00 via INTRAVENOUS

## 2013-07-04 NOTE — Patient Instructions (Addendum)
[facility] Discharge Instructions for Patients Receiving Chemotherapy  Today you received the following chemotherapy agents: Kadcyla  To help prevent nausea and vomiting after your treatment, we encourage you to take your nausea medication as prescribed.   If you develop nausea and vomiting that is not controlled by your nausea medication, call the clinic.   BELOW ARE SYMPTOMS THAT SHOULD BE REPORTED IMMEDIATELY:  *FEVER GREATER THAN 100.5 F  *CHILLS WITH OR WITHOUT FEVER  NAUSEA AND VOMITING THAT IS NOT CONTROLLED WITH YOUR NAUSEA MEDICATION  *UNUSUAL SHORTNESS OF BREATH  *UNUSUAL BRUISING OR BLEEDING  TENDERNESS IN MOUTH AND THROAT WITH OR WITHOUT PRESENCE OF ULCERS  *URINARY PROBLEMS  *BOWEL PROBLEMS  UNUSUAL RASH Items with * indicate a potential emergency and should be followed up as soon as possible.  Feel free to call the clinic you have any questions or concerns. The clinic phone number is [phone].    

## 2013-07-04 NOTE — Progress Notes (Signed)
Per pt request, she did not want to stay for the 30 min observation post kadcyla.  SLJ

## 2013-07-04 NOTE — Progress Notes (Signed)
[name]  [phone]) [phone] [phone]  OFFICE PROGRESS NOTE   ID: [name]   DOB: June 23, 1956  MR#: [id number]  ZHY#:[id number]   PCP: [name] Cashing, MD SU: [name]. [name], [name]/[name], [name] RAD EXB:[id number] [name], [name]   HISTORY OF PRESENT ILLNESS: The patient originally presented in May 2003 when she noticed a lump in the upper inner quadrant of her right breast in September 2003.  She sought attention and had a mammogram which showed an obvious carcinoma in the upper inner quadrant of the right breast, approximately 2 cm.  There were some enlarged lymph nodes in the axilla and an FNA done showed those consistent with malignant cells, most likely an invasive ductal carcinoma.  At that point she was unsure of what to do and was referred by Dr. Francina [name] for a discussion of her treatment options.  By biopsy, it was ER/PR negative and HER-2 was 3+.  DNA index was 1.42.  We reiterated that it was most important to have her disease surgically addressed at that point and had recommended lumpectomy and axillary nodes to be addressed with which she followed through and on 04-03-02, Dr. Maple [name] performed a right partial mastectomy and a right axillary lymph node dissection.  Final pathology revealed a 2.4 cm. high grade, Grade III invasive ductal carcinoma with an adjacent .8 cm. also high grade invasive ductal carcinoma which was felt to represent an intramammary metastasis rather than a second primary.  A smaller mass was just medial to the larger mass.  The smaller mass was associated with high grade DCIS component.  There was no definite lymphovascular invasion identified.  However, one of fourteen axillary lymph nodes did contain a 1.5 cm. metastatic deposit.  Postoperatively she did very well.  We reiterated the fact that she did need adjuvant chemotherapy, however, she refused and decided that she would pursue radiation.  She received radiation and completed  that on 08-14-02. Her subsequent history is as detailed below.   INTERVAL HISTORY: [name] returns today for followup of her breast cancer. The interval history is unremarkable. She is a little bit "down" today because she was hoping her son would bring her and she was not able to get off work.  REVIEW OF SYSTEMS:  She is having a little bit of ankle swelling bilaterally. She has run out of of her Dyazide. We are refill in that for her today. She still has right upper extremity lymphedema but the pain in the right arm is much better. The rest of the detailed review of systems today was noncontributory.  PAST MEDICAL HISTORY: Past Medical History  Diagnosis Date  . DVT (deep venous thrombosis) 10/07/2011  . Breast cancer   . Hypertension   . Rheumatic fever     PAST SURGICAL HISTORY: Past Surgical History  Procedure Laterality Date  . Mastectomy      bilateral  . Breast surgery    . Port a cath revision      FAMILY HISTORY Family History  Problem Relation Age of Onset  . Pancreatic cancer Mother   . Kidney cancer Sister 54  . Breast cancer Sister 24  . Breast cancer Other 30  The patient's father died in his 38s  from complications of alcohol abuse. The patient's mother died in her 78s from pancreatic cancer. The patient has 6 brothers, 2 sisters. Both her sisters have had breast cancer, both diagnosed after the age of 33. The patient has not had genetic testing  so far.   GYNECOLOGIC HISTORY: Menarche age 36; first live birth age 32. She is GXP4. She is not sure when she stopped having periods. She never used hormone replacement.   SOCIAL HISTORY: The patient has worked in the past as a Agricultural engineer. At home in addition to the patient are her husband [name], originally from [place], who works as a Naval architect.  Also living in the home is her son [name] and her daughter [name] (she is studying to be a Engineer, civil (consulting)).  The other 2 children are [name], who has a college degree  in psychology and works as a Clinical research associate; and [name], who lives in [name] and works as a Nurse, adult.   ADVANCED DIRECTIVES: Not in place  HEALTH MAINTENANCE: History  Substance Use Topics  . Smoking status: Light Tobacco Smoker  . Smokeless tobacco: Never Used  . Alcohol Use: Yes     Comment: Rarely     Colonoscopy:  Not on file  PAP:  Not on file  Bone density:  Not on file  Lipid panel:  Not on file  Allergies  Allergen Reactions  . Hydrocodone-Acetaminophen Itching and Rash    Current Outpatient Prescriptions  Medication Sig Dispense Refill  . alprazolam (XANAX) 2 MG tablet Take 2 mg by mouth daily.       Marland Kitchen amLODipine (NORVASC) 10 MG tablet Take 10 mg by mouth daily.        . Ascorbic Acid (VITAMIN C) 100 MG tablet Take 100 mg by mouth daily.        . calcium-vitamin D (OSCAL WITH D) 500-200 MG-UNIT per tablet Take 1 tablet by mouth daily.        Marland Kitchen gabapentin (NEURONTIN) 100 MG capsule TAKE 1 CAPSULE BY MOUTH AT BEDTIME  30 capsule  2  . LORazepam (ATIVAN) 0.5 MG tablet Take 0.5 mg by mouth every 8 (eight) hours as needed.       . metaxalone (SKELAXIN) 800 MG tablet Take 800 mg by mouth 3 (three) times daily.       . Multiple Vitamin (MULTIVITAMIN) capsule Take 1 capsule by mouth daily.        . mupirocin ointment (BACTROBAN) 2 % Apply 1 application topically as needed.       Marland Kitchen oxyCODONE-acetaminophen (PERCOCET) 10-325 MG per tablet Take 1 tablet by mouth every 6 (six) hours as needed.       Marland Kitchen oxymorphone (OPANA) 10 MG tablet Take 10 mg by mouth 2 (two) times daily.       . promethazine (PHENERGAN) 25 MG tablet Take 1 tablet (25 mg total) by mouth every 6 (six) hours as needed for nausea.  30 tablet  0  . promethazine-codeine (PHENERGAN WITH CODEINE) 6.25-10 MG/5ML syrup po q 6-8 hrs prn cough  240 mL  0  . traMADol (ULTRAM) 50 MG tablet Take 1 tablet (50 mg total) by mouth every 6 (six) hours as needed.  30 tablet  0  . triamterene-hydrochlorothiazide (DYAZIDE)  37.5-25 MG per capsule Take 1 each (1 capsule total) by mouth every morning.  30 capsule  1   No current facility-administered medications for this visit.    OBJECTIVE: Middle-aged [place] American woman who appears his stated age 57 Vitals:   07/04/13 1133  BP: 136/84  Pulse: 91  Temp: 98.2 F (36.8 C)  Resp: 20     Body mass index is 31.6 kg/(m^2).    ECOG FS: 1 Filed Weights   07/04/13 1133  Weight: 195  lb 11.2 oz (88.769 kg)   Sclerae unicteric Oropharynx clear No cervical or supraclavicular adenopathy and particularly I do not palpate any right cervical or supraclavicular mass or skin change Lungs no rales or rhonchi Heart regular rate and rhythm Abd obese, benign MSK no focal spinal tenderness, chronic right upper extremity lymphedema, with compression sleeve in place Neuro: nonfocal, pleasant affect Breasts:  Status post bilateral mastectomies; no evidence of local recurrence; the right axilla is benign  LAB RESULTS: Lab Results  Component Value Date   WBC 5.4 07/04/2013   NEUTROABS 2.3 07/04/2013   HGB 13.3 07/04/2013   HCT 40.9 07/04/2013   MCV 87.6 07/04/2013   PLT 116* 07/04/2013      Chemistry      Component Value Date/Time   NA 140 06/14/2013 1031   NA 137 02/14/2013 1648   K 3.6 06/14/2013 1031   K 3.2* 02/14/2013 1648   CL 101 03/07/2013 1411   CL 97 02/14/2013 1648   CO2 27 06/14/2013 1031   CO2 29 02/14/2013 1648   BUN 8.6 06/14/2013 1031   BUN 10 02/14/2013 1648   CREATININE 0.8 06/14/2013 1031   CREATININE 0.68 02/14/2013 1648      Component Value Date/Time   CALCIUM 10.4 06/14/2013 1031   CALCIUM 9.8 02/14/2013 1648   ALKPHOS 140 06/14/2013 1031   ALKPHOS 124* 02/14/2013 1648   AST 72* 06/14/2013 1031   AST 59* 02/14/2013 1648   ALT 38 06/14/2013 1031   ALT 28 02/14/2013 1648   BILITOT 0.69 06/14/2013 1031   BILITOT 0.4 02/14/2013 1648      Lab Results  Component Value Date   LABCA2 37 10/05/2012    No results found for this basename: INR,  in the last 168  hours  STUDIES: Most recent echocardiogram on 04/23/2013 showed an ejection fraction of 55-60%.   ASSESSMENT: Mrs. Virrueta is a 57 y.o. [place], [place] woman with stage IV breast cancer manifested chiefly by loco-regional nodal disease (neck, chest), without lung, liver, bone, or brain involvement.  (1) Status post right lumpectomy and sentinel lymph node sampling 03/04/2002 for 2 separate foci of invasive ductal carcinoma, mpT2 pN1 or stage IIB, both foci grade 3, both estrogen and progesterone receptor positive, both HercepTest 3+, Mib-1 56%  (2) Reexcision for margins 05/27/2002 showed no residual cancer in the breast.  (3) The patient refused adjuvant systemic therapy.  (4) Adjuvant radiation treatment completed 08/14/2002.  (5) Diffuse recurrence in the right breast in 02/2004 showing a morphologically different tumor, again grade 3, again estrogen and progesterone receptor negative, with an MIB-1 of 14% and Herceptest 3+.  (5) Between 03/2004 and 07/2004 she received dose dense Doxorubicin/Cyclophosphamide x 4 given with Herceptin, followed by weekly Docetaxel x 8, again given with Herceptin.  (6) Right mastectomy 07/13/2004 showed scattered microscopic foci of residual disease over an area of greater than 5 cm. Margins were negative.  (7) Postoperative Docetaxel continued until 09/2004.  (8) Trastuzumab (Herceptin) given 08/2004 through 01/2012 with some brief interruptions.  (9) Isolated right cervical nodal recurrence 10/2005, treated with radiation to the right supraclavicular area (total 41.5 gray) completed 12/29/2005.  (10) Navelbine given together with Herceptin  11/2005 through 03/2006.  (9) Left mastectomy 02/13/2006 for ductal carcinoma in situ, grade 2, estrogen and progesterone receptor negative, with negative margins; 0 of 3 lymph nodes involved  (10) The patient treated with Lapatinib and Capecitabine before 10/2009, for an unclear duration and with unclear  results (cannot locate data  on chart review).  (11) Status post right supraclavicular lymph node biopsy 09/2010 again positive for an invasive ductal carcinoma, estrogen and progesterone receptor negative, HER-2 positive by CISH with a ratio 4.25.  (12) Navelbine given together with Herceptin between 05/2011 and 11/2011.  (13) Carboplatin/Gemcitabine/Herceptin given for 2 cycles, in 12/2011 and 01/2012.  (14) TDM-1 (Kadcyla) started 02/2012. Most recent echo on 04/23/2013 showed a well preserved ejection fraction.  (15) Deep vein thrombosis of the right upper extremity documented 04/20/2011.  She completed anticoagulant therapy with Coumadin on 03/25/2013.  (16) Chronic right upper extremity lymphedema.  (17) Right chest port-a-cath removal due to infection on 01/28/2013. Left chest Port-A-Cath placed on 04/08/2013.   PLAN: Briza is doing remarkably well. We are going to continue the TDM-1 until there is evidence of disease progression. She will have an echocardiogram in September. We are going to start seeing her every other treatment. With her first October treatment she will also have a chest x-ray. Unless there are new symptoms we will not repeat a PET scan on until April of next year.  I refilled her Dyazide today. She knows to call for any problems that may develop before her next visit here.   [name],GUSTAV C  MD  07/04/2013 11:42 AM

## 2013-07-04 NOTE — Telephone Encounter (Signed)
appts made and printed...td 

## 2013-07-05 ENCOUNTER — Other Ambulatory Visit: Payer: Self-pay | Admitting: *Deleted

## 2013-07-05 DIAGNOSIS — C50919 Malignant neoplasm of unspecified site of unspecified female breast: Secondary | ICD-10-CM

## 2013-07-05 MED ORDER — TRIAMTERENE-HCTZ 37.5-25 MG PO CAPS: 1.0000 | ORAL_CAPSULE | ORAL | Status: DC

## 2013-07-08 ENCOUNTER — Other Ambulatory Visit: Payer: Self-pay | Admitting: Oncology

## 2013-07-08 DIAGNOSIS — C50919 Malignant neoplasm of unspecified site of unspecified female breast: Secondary | ICD-10-CM

## 2013-07-18 ENCOUNTER — Ambulatory Visit (HOSPITAL_COMMUNITY)
Admission: RE | Admit: 2013-07-18 | Discharge: 2013-07-18 | Disposition: A | Discharge status: Home / Self Care | Payer: Commercial Managed Care - PPO | Source: Ambulatory Visit | Attending: Family Medicine | Admitting: Family Medicine

## 2013-07-18 ENCOUNTER — Ambulatory Visit (HOSPITAL_BASED_OUTPATIENT_CLINIC_OR_DEPARTMENT_OTHER)
Admission: RE | Admit: 2013-07-18 | Discharge: 2013-07-18 | Disposition: A | Discharge status: Home / Self Care | Payer: Commercial Managed Care - PPO | Source: Ambulatory Visit | Attending: Cardiology | Admitting: Cardiology

## 2013-07-18 ENCOUNTER — Encounter (HOSPITAL_COMMUNITY): Payer: Self-pay

## 2013-07-18 VITALS — BP 106/54 | HR 71 | Ht 66.0 in | Wt 184.4 lb

## 2013-07-18 DIAGNOSIS — I10 Essential (primary) hypertension: Secondary | ICD-10-CM | POA: Insufficient documentation

## 2013-07-18 DIAGNOSIS — Z79899 Other long term (current) drug therapy: Secondary | ICD-10-CM | POA: Insufficient documentation

## 2013-07-18 DIAGNOSIS — F17200 Nicotine dependence, unspecified, uncomplicated: Secondary | ICD-10-CM | POA: Insufficient documentation

## 2013-07-18 DIAGNOSIS — C50919 Malignant neoplasm of unspecified site of unspecified female breast: Secondary | ICD-10-CM

## 2013-07-18 DIAGNOSIS — I359 Nonrheumatic aortic valve disorder, unspecified: Secondary | ICD-10-CM

## 2013-07-18 NOTE — Patient Instructions (Addendum)
We will contact you in 3 months to schedule your next appointment and echocardiogram  

## 2013-07-18 NOTE — Progress Notes (Signed)
Echocardiogram 2D Echocardiogram has been performed.  [name] 07/18/2013, 10:51 AM

## 2013-07-18 NOTE — Progress Notes (Signed)
Patient ID: [name], female DOB: Oct 11, 1956, 58 y.o. MRN: [id number]  Oncologist: Dr Darnelle Catalan  PCP: Dr Welton Flakes Surgeon: Dr Donell Beers   HPI:   [name] is a 57 y.o. woman with a history of HTN, rheumatic fever, stage IV breast cancer referred by Dr. Darnelle Catalan for enrollment into the cardio-oncology clinic.   She was initially diagnosed with breast cancer 2003 and had recurrence in 2005. In 2003 she underwent right lumpectomy and sentinel lymph node sampling of 2 separate foci of invasive ductal carcinoma, mpT2 pN1 or stage IIB, both foci grade 3, both ER/PR positive, both Her 2 neu +, Mib-1 56. Then underwent right mastectomy 07/13/2004 and S/P left mastectomy 02/13/2006. Also had DVT RUE 2012- on coumadin for 1 year.   Between May and September 2005 received dose dense doxorubicin/cyclophosphamide x 4 given with Herceptin, followed by weekly docetaxel x 8, again given with Herceptin. Carboplatin/gemcitabine/Herceptin given for 2 cycles, February and March 2013. Navelbine given together with Herceptin between July 2012 and January 2013.   ECHOs  05/08/12 EF 55-60%  09/10/2013 EF 55-65%  12/11/12 EF 55-60% lateral s' 11.4  04/23/13 EF 55-60% lateral s' 13.7  9/14 EF 55-60%, lateral s' 14.1  Follow up: Doing pretty good. Denies any SOB, orthopnea, edema, or CP. Herceptin treatment every 3 weeks which is indefinite.   SH: Lives with husband in [place]. Has 4 children.   ROS: All systems negative except as listed in HPI, PMH and Problem List.  Past Medical History  Diagnosis Date  . DVT (deep venous thrombosis) 10/07/2011  . Breast cancer   . Hypertension   . Rheumatic fever     Current Outpatient Prescriptions  Medication Sig Dispense Refill  . alprazolam (XANAX) 2 MG tablet Take 2 mg by mouth daily.       Marland Kitchen amLODipine (NORVASC) 10 MG tablet Take 10 mg by mouth daily.        . Ascorbic Acid (VITAMIN C) 100 MG tablet Take 100 mg by mouth daily.        . calcium-vitamin D (OSCAL  WITH D) 500-200 MG-UNIT per tablet Take 1 tablet by mouth daily.        Marland Kitchen gabapentin (NEURONTIN) 100 MG capsule TAKE 1 CAPSULE BY MOUTH AT BEDTIME  30 capsule  2  . hydrochlorothiazide (HYDRODIURIL) 25 MG tablet TAKE 1 TABLET BY MOUTH 3 TIMES A WEEK ON MONDAY, WEDNESDAY, AND FRIDAY  30 tablet  1  . LORazepam (ATIVAN) 0.5 MG tablet Take 0.5 mg by mouth every 8 (eight) hours as needed.       . metaxalone (SKELAXIN) 800 MG tablet Take 800 mg by mouth 3 (three) times daily.       . Multiple Vitamin (MULTIVITAMIN) capsule Take 1 capsule by mouth daily.        . mupirocin ointment (BACTROBAN) 2 % Apply 1 application topically as needed.       Marland Kitchen oxyCODONE-acetaminophen (PERCOCET) 10-325 MG per tablet Take 1 tablet by mouth every 6 (six) hours as needed.       Marland Kitchen oxymorphone (OPANA) 10 MG tablet Take 10 mg by mouth 2 (two) times daily.       . promethazine (PHENERGAN) 25 MG tablet Take 1 tablet (25 mg total) by mouth every 6 (six) hours as needed for nausea.  30 tablet  0  . promethazine-codeine (PHENERGAN WITH CODEINE) 6.25-10 MG/5ML syrup po q 6-8 hrs prn cough  240 mL  0  . triamterene-hydrochlorothiazide (DYAZIDE) 37.5-25 MG  per capsule Take 1 each (1 capsule total) by mouth every morning.  30 capsule  1   No current facility-administered medications for this encounter.     PHYSICAL EXAM: Filed Vitals:   07/18/13 1119  BP: 106/54  Pulse: 71  Height: 5\' 6"  (1.676 m)  Weight: 184 lb 6.4 oz (83.643 kg)  SpO2: 98%    General:  Well appearing. No resp difficulty HEENT: normal Neck: supple. JVP flat. Carotids 2+ bilaterally; no bruits. No lymphadenopathy or thryomegaly appreciated. Cor: PMI normal. Regular rate & rhythm. No rubs, gallops or murmurs. Lungs: clear Abdomen: soft, nontender, nondistended. No hepatosplenomegaly. No bruits or masses. Good bowel sounds. Extremities: no cyanosis, clubbing, rash, edema Neuro: alert & orientedx3, cranial nerves grossly intact. Moves all 4  extremities w/o difficulty. Affect pleasant.  ASSESSMENT & PLAN:  57 yo followed in cardio-oncology clinic given Herceptin use.  Status post right lumpectomy and sentinel lymph node sampling 03/04/2002 for 2 separate foci of invasive ductal carcinoma, mpT2 pN1 or stage IIB, both foci grade 3, both estrogen and progesterone receptor positive, both HercepTest 3+, Mib-1 56%.  I reviewed today's echo which showed stable LV systolic function and lateral s'.  We will have her followup in 3 months.  At that time, we will obtain an echocardiogram again given ongoing Herceptin use.   Marca Ancona 07/18/2013 1:16 PM

## 2013-07-24 ENCOUNTER — Other Ambulatory Visit: Payer: Self-pay | Admitting: Oncology

## 2013-07-25 ENCOUNTER — Other Ambulatory Visit: Payer: Commercial Managed Care - PPO | Admitting: Lab

## 2013-07-25 ENCOUNTER — Telehealth: Payer: Self-pay | Admitting: Oncology

## 2013-07-25 ENCOUNTER — Ambulatory Visit: Payer: Commercial Managed Care - PPO | Admitting: Physician Assistant

## 2013-07-25 ENCOUNTER — Ambulatory Visit (HOSPITAL_BASED_OUTPATIENT_CLINIC_OR_DEPARTMENT_OTHER): Payer: Commercial Managed Care - PPO

## 2013-07-25 ENCOUNTER — Other Ambulatory Visit (HOSPITAL_BASED_OUTPATIENT_CLINIC_OR_DEPARTMENT_OTHER): Payer: Commercial Managed Care - PPO | Admitting: Lab

## 2013-07-25 VITALS — BP 96/64 | HR 89 | Temp 98.4°F | Resp 20

## 2013-07-25 DIAGNOSIS — Z5112 Encounter for antineoplastic immunotherapy: Secondary | ICD-10-CM

## 2013-07-25 DIAGNOSIS — C50219 Malignant neoplasm of upper-inner quadrant of unspecified female breast: Secondary | ICD-10-CM

## 2013-07-25 DIAGNOSIS — C773 Secondary and unspecified malignant neoplasm of axilla and upper limb lymph nodes: Secondary | ICD-10-CM

## 2013-07-25 DIAGNOSIS — C50919 Malignant neoplasm of unspecified site of unspecified female breast: Secondary | ICD-10-CM

## 2013-07-25 LAB — CBC WITH DIFFERENTIAL/PLATELET
BASO%: 1.8 % (ref 0.0–2.0)
Basophils Absolute: 0.1 10*3/uL (ref 0.0–0.1)
EOS%: 8.5 % — ABNORMAL HIGH (ref 0.0–7.0)
Eosinophils Absolute: 0.5 10*3/uL (ref 0.0–0.5)
HCT: 40.8 % (ref 34.8–46.6)
HGB: 13.5 g/dL (ref 11.6–15.9)
LYMPH%: 25.6 % (ref 14.0–49.7)
MCH: 28.4 pg (ref 25.1–34.0)
MCHC: 33.1 g/dL (ref 31.5–36.0)
MCV: 85.9 fL (ref 79.5–101.0)
MONO#: 0.9 10*3/uL (ref 0.1–0.9)
MONO%: 14.8 % — ABNORMAL HIGH (ref 0.0–14.0)
NEUT#: 3 10*3/uL (ref 1.5–6.5)
NEUT%: 49.3 % (ref 38.4–76.8)
Platelets: 133 10*3/uL — ABNORMAL LOW (ref 145–400)
RBC: 4.75 10*6/uL (ref 3.70–5.45)
RDW: 15 % — ABNORMAL HIGH (ref 11.2–14.5)
WBC: 6.1 10*3/uL (ref 3.9–10.3)
lymph#: 1.6 10*3/uL (ref 0.9–3.3)
nRBC: 0 % (ref 0–0)

## 2013-07-25 LAB — COMPREHENSIVE METABOLIC PANEL (CC13)
ALT: 34 U/L (ref 0–55)
AST: 78 U/L — ABNORMAL HIGH (ref 5–34)
Albumin: 3.4 g/dL — ABNORMAL LOW (ref 3.5–5.0)
Alkaline Phosphatase: 141 U/L (ref 40–150)
BUN: 16.8 mg/dL (ref 7.0–26.0)
CO2: 31 mEq/L — ABNORMAL HIGH (ref 22–29)
Calcium: 10.2 mg/dL (ref 8.4–10.4)
Chloride: 101 mEq/L (ref 98–109)
Creatinine: 0.8 mg/dL (ref 0.6–1.1)
Glucose: 112 mg/dl (ref 70–140)
Potassium: 3.6 mEq/L (ref 3.5–5.1)
Sodium: 141 mEq/L (ref 136–145)
Total Bilirubin: 0.85 mg/dL (ref 0.20–1.20)
Total Protein: 7.7 g/dL (ref 6.4–8.3)

## 2013-07-25 MED ORDER — DIPHENHYDRAMINE HCL 25 MG PO CAPS
25.0000 mg | ORAL_CAPSULE | Freq: Once | ORAL | Status: AC
  Administered 2013-07-25: 25 mg via ORAL

## 2013-07-25 MED ORDER — HEPARIN SOD (PORK) LOCK FLUSH 100 UNIT/ML IV SOLN
500.0000 [IU] | Freq: Once | INTRAVENOUS | Status: AC | PRN
  Administered 2013-07-25: 500 [IU]
  Filled 2013-07-25: qty 5

## 2013-07-25 MED ORDER — ACETAMINOPHEN 325 MG PO TABS
ORAL_TABLET | ORAL | Status: AC
  Filled 2013-07-25: qty 2

## 2013-07-25 MED ORDER — SODIUM CHLORIDE 0.9 % IV SOLN
Freq: Once | INTRAVENOUS | Status: AC
  Administered 2013-07-25: 13:00:00 via INTRAVENOUS

## 2013-07-25 MED ORDER — ACETAMINOPHEN 325 MG PO TABS
650.0000 mg | ORAL_TABLET | Freq: Once | ORAL | Status: AC
  Administered 2013-07-25: 650 mg via ORAL

## 2013-07-25 MED ORDER — SODIUM CHLORIDE 0.9 % IJ SOLN
10.0000 mL | INTRAMUSCULAR | Status: DC | PRN
  Administered 2013-07-25: 10 mL
  Filled 2013-07-25: qty 10

## 2013-07-25 MED ORDER — DIPHENHYDRAMINE HCL 25 MG PO CAPS
ORAL_CAPSULE | ORAL | Status: AC
  Filled 2013-07-25: qty 1

## 2013-07-25 MED ORDER — SODIUM CHLORIDE 0.9 % IV SOLN
320.0000 mg | Freq: Once | INTRAVENOUS | Status: AC
  Administered 2013-07-25: 320 mg via INTRAVENOUS
  Filled 2013-07-25: qty 16

## 2013-07-25 NOTE — Telephone Encounter (Signed)
Pt called and wants to r/s chemo for today , transferred call to [facility]

## 2013-07-25 NOTE — Patient Instructions (Addendum)
[facility] Discharge Instructions for Patients Receiving Chemotherapy  Today you received the following chemotherapy agents:  Kadcyla  To help prevent nausea and vomiting after your treatment, we encourage you to take your nausea medication as ordered per MD.   If you develop nausea and vomiting that is not controlled by your nausea medication, call the clinic.   BELOW ARE SYMPTOMS THAT SHOULD BE REPORTED IMMEDIATELY:  *FEVER GREATER THAN 100.5 F  *CHILLS WITH OR WITHOUT FEVER  NAUSEA AND VOMITING THAT IS NOT CONTROLLED WITH YOUR NAUSEA MEDICATION  *UNUSUAL SHORTNESS OF BREATH  *UNUSUAL BRUISING OR BLEEDING  TENDERNESS IN MOUTH AND THROAT WITH OR WITHOUT PRESENCE OF ULCERS  *URINARY PROBLEMS  *BOWEL PROBLEMS  UNUSUAL RASH Items with * indicate a potential emergency and should be followed up as soon as possible.  Feel free to call the clinic you have any questions or concerns. The clinic phone number is [phone].    

## 2013-08-15 ENCOUNTER — Encounter (INDEPENDENT_AMBULATORY_CARE_PROVIDER_SITE_OTHER): Payer: Self-pay

## 2013-08-15 ENCOUNTER — Ambulatory Visit (HOSPITAL_BASED_OUTPATIENT_CLINIC_OR_DEPARTMENT_OTHER): Payer: Commercial Managed Care - PPO | Admitting: Physician Assistant

## 2013-08-15 ENCOUNTER — Telehealth: Payer: Self-pay | Admitting: Oncology

## 2013-08-15 ENCOUNTER — Ambulatory Visit: Payer: Commercial Managed Care - PPO

## 2013-08-15 ENCOUNTER — Ambulatory Visit: Payer: Commercial Managed Care - PPO | Admitting: Physician Assistant

## 2013-08-15 ENCOUNTER — Other Ambulatory Visit (HOSPITAL_BASED_OUTPATIENT_CLINIC_OR_DEPARTMENT_OTHER): Payer: Commercial Managed Care - PPO | Admitting: Lab

## 2013-08-15 ENCOUNTER — Other Ambulatory Visit: Payer: Self-pay | Admitting: Physician Assistant

## 2013-08-15 ENCOUNTER — Telehealth: Payer: Self-pay | Admitting: *Deleted

## 2013-08-15 ENCOUNTER — Encounter: Payer: Self-pay | Admitting: Physician Assistant

## 2013-08-15 ENCOUNTER — Ambulatory Visit (HOSPITAL_COMMUNITY)
Admission: RE | Admit: 2013-08-15 | Discharge: 2013-08-15 | Disposition: A | Discharge status: Home / Self Care | Payer: Commercial Managed Care - PPO | Source: Ambulatory Visit | Attending: Oncology | Admitting: Oncology

## 2013-08-15 VITALS — BP 154/91 | HR 81 | Temp 98.0°F | Resp 20 | Ht 66.0 in | Wt 186.0 lb

## 2013-08-15 DIAGNOSIS — C50919 Malignant neoplasm of unspecified site of unspecified female breast: Secondary | ICD-10-CM

## 2013-08-15 DIAGNOSIS — E8989 Other postprocedural endocrine and metabolic complications and disorders: Secondary | ICD-10-CM | POA: Insufficient documentation

## 2013-08-15 DIAGNOSIS — R0602 Shortness of breath: Secondary | ICD-10-CM | POA: Insufficient documentation

## 2013-08-15 DIAGNOSIS — I82401 Acute embolism and thrombosis of unspecified deep veins of right lower extremity: Secondary | ICD-10-CM

## 2013-08-15 DIAGNOSIS — I972 Postmastectomy lymphedema syndrome: Secondary | ICD-10-CM

## 2013-08-15 DIAGNOSIS — C50219 Malignant neoplasm of upper-inner quadrant of unspecified female breast: Secondary | ICD-10-CM

## 2013-08-15 DIAGNOSIS — I890 Lymphedema, not elsewhere classified: Secondary | ICD-10-CM

## 2013-08-15 DIAGNOSIS — C773 Secondary and unspecified malignant neoplasm of axilla and upper limb lymph nodes: Secondary | ICD-10-CM

## 2013-08-15 DIAGNOSIS — Z853 Personal history of malignant neoplasm of breast: Secondary | ICD-10-CM | POA: Insufficient documentation

## 2013-08-15 DIAGNOSIS — Z86718 Personal history of other venous thrombosis and embolism: Secondary | ICD-10-CM

## 2013-08-15 LAB — CBC WITH DIFFERENTIAL/PLATELET
BASO%: 0.6 % (ref 0.0–2.0)
Basophils Absolute: 0 10*3/uL (ref 0.0–0.1)
EOS%: 2 % (ref 0.0–7.0)
Eosinophils Absolute: 0.1 10*3/uL (ref 0.0–0.5)
HCT: 41.3 % (ref 34.8–46.6)
HGB: 13.7 g/dL (ref 11.6–15.9)
LYMPH%: 24.4 % (ref 14.0–49.7)
MCH: 28.3 pg (ref 25.1–34.0)
MCHC: 33.2 g/dL (ref 31.5–36.0)
MCV: 85.3 fL (ref 79.5–101.0)
MONO#: 0.5 10*3/uL (ref 0.1–0.9)
MONO%: 8.9 % (ref 0.0–14.0)
NEUT#: 3.2 10*3/uL (ref 1.5–6.5)
NEUT%: 64.1 % (ref 38.4–76.8)
Platelets: 92 10*3/uL — ABNORMAL LOW (ref 145–400)
RBC: 4.84 10*6/uL (ref 3.70–5.45)
RDW: 15 % — ABNORMAL HIGH (ref 11.2–14.5)
WBC: 5 10*3/uL (ref 3.9–10.3)
lymph#: 1.2 10*3/uL (ref 0.9–3.3)
nRBC: 0 % (ref 0–0)

## 2013-08-15 NOTE — Progress Notes (Signed)
[facility]  Telephone:([phone]) [phone] Fax:(336) [phone]  OFFICE PROGRESS NOTE   ID: [name]   DOB: 1956/08/12  MR#: [id number]  JYN#:[id number]   PCP: Billee Cashing, MD SU: [name], [name], [name] RAD QMV:HQIONGE Kathrynn Running, [name] OTHER:  Arvilla Meres, MD  CHIEF COMPLAINT:  Stage IV Breast Cancer   HISTORY OF PRESENT ILLNESS: The patient originally presented in May 2003 when she noticed a lump in the upper inner quadrant of her right breast in September 2003.  She sought attention and had a mammogram which showed an obvious carcinoma in the upper inner quadrant of the right breast, approximately 2 cm.  There were some enlarged lymph nodes in the axilla and an FNA done showed those consistent with malignant cells, most likely an invasive ductal carcinoma.  At that point she was unsure of what to do and was referred by Dr. Francina Ames for a discussion of her treatment options.  By biopsy, it was ER/PR negative and HER-2 was 3+.  DNA index was 1.42.  We reiterated that it was most important to have her disease surgically addressed at that point and had recommended lumpectomy and axillary nodes to be addressed with which she followed through and on 04-03-02, Dr. Maple Hudson performed a right partial mastectomy and a right axillary lymph node dissection.  Final pathology revealed a 2.4 cm. high grade, Grade III invasive ductal carcinoma with an adjacent .8 cm. also high grade invasive ductal carcinoma which was felt to represent an intramammary metastasis rather than a second primary.  A smaller mass was just medial to the larger mass.  The smaller mass was associated with high grade DCIS component.  There was no definite lymphovascular invasion identified.  However, one of fourteen axillary lymph nodes did contain a 1.5 cm. metastatic deposit.  Postoperatively she did very well.  We reiterated the fact that she did need adjuvant chemotherapy, however, she refused and  decided that she would pursue radiation.  She received radiation and completed that on 08-14-02. Her subsequent history is as detailed below.   INTERVAL HISTORY: [name] returns today accompanied by her daughter for followup of her stage IV breast cancer. She continues to receive  Kadcyla every 3 weeks with good tolerance, and is due for her next dose today. She just had a repeat echo in September, showing a well preserved ejection fraction.   [name] tells me she is extremely tired today, and has been under quite a bit of stress. A good friend of hers had a car accident and totaled her car. Fortunately there were no injuries. When this happened, [name] tells me she got extremely upset and had a panic attack. She sat down, took some deep breaths, relaxed, and the panic attack resolved.    REVIEW OF SYSTEMS: [name] has had no recent illnesses and denies any fevers or chills. She's had no skin changes or rashes. She does have some chronic swelling in her feet and ankles which is intermittent. She takes Dyazide as needed and needs a refill today. She's eating and drinking well and currently denies any nausea or change in bowel or bladder habits. She's had no increased cough, no increased shortness of breath, no orthopnea, no chest pain, no palpitations. She's had no abnormal headaches or dizziness. She continues to have lymphedema in the right arm, but this is stable, and she currently denies any pain.  The rest of the detailed review of systems today was noncontributory.   PAST MEDICAL HISTORY: Past  Medical History  Diagnosis Date  . DVT (deep venous thrombosis) 10/07/2011  . Breast cancer   . Hypertension   . Rheumatic fever     PAST SURGICAL HISTORY: Past Surgical History  Procedure Laterality Date  . Mastectomy      bilateral  . Breast surgery    . Port a cath revision      FAMILY HISTORY Family History  Problem Relation Age of Onset  . Pancreatic cancer Mother   . Kidney  cancer Sister 79  . Breast cancer Sister 79  . Breast cancer Other 32  The patient's father died in his 35s  from complications of alcohol abuse. The patient's mother died in her 13s from pancreatic cancer. The patient has 6 brothers, 2 sisters. Both her sisters have had breast cancer, both diagnosed after the age of 31. The patient has not had genetic testing so far.   GYNECOLOGIC HISTORY: Menarche age 40; first live birth age 60. She is GXP4. She is not sure when she stopped having periods. She never used hormone replacement.   SOCIAL HISTORY: The patient has worked in the past as a Agricultural engineer. At home in addition to the patient are her husband [name], originally from [place], who works as a Naval architect.  Also living in the home is her son [name] and her daughter Maryland Pink (she is studying to be a Engineer, civil (consulting)).  The other 2 children are [name], who has a college degree in psychology and works as a Clinical research associate; and [name], who lives in [place] and works as a Nurse, adult.   ADVANCED DIRECTIVES: Not in place  HEALTH MAINTENANCE: History  Substance Use Topics  . Smoking status: Light Tobacco Smoker  . Smokeless tobacco: Never Used  . Alcohol Use: Yes     Comment: Rarely     Colonoscopy:  Not on file  PAP:  Not on file  Bone density:  Not on file  Lipid panel:  Not on file    Allergies  Allergen Reactions  . Tramadol Nausea Only  . Hydrocodone-Acetaminophen Itching and Rash    Current Outpatient Prescriptions  Medication Sig Dispense Refill  . alprazolam (XANAX) 2 MG tablet Take 2 mg by mouth daily.       Marland Kitchen amLODipine (NORVASC) 10 MG tablet Take 10 mg by mouth daily.        . Ascorbic Acid (VITAMIN C) 100 MG tablet Take 100 mg by mouth daily.        . calcium-vitamin D (OSCAL WITH D) 500-200 MG-UNIT per tablet Take 1 tablet by mouth daily.        Marland Kitchen gabapentin (NEURONTIN) 100 MG capsule TAKE 1 CAPSULE BY MOUTH AT BEDTIME  30 capsule  2  . hydrochlorothiazide (HYDRODIURIL) 25  MG tablet TAKE 1 TABLET BY MOUTH 3 TIMES A WEEK ON MONDAY, WEDNESDAY, AND FRIDAY  30 tablet  1  . LORazepam (ATIVAN) 0.5 MG tablet Take 0.5 mg by mouth every 8 (eight) hours as needed.       . metaxalone (SKELAXIN) 800 MG tablet Take 800 mg by mouth 3 (three) times daily.       . Multiple Vitamin (MULTIVITAMIN) capsule Take 1 capsule by mouth daily.        . mupirocin ointment (BACTROBAN) 2 % Apply 1 application topically as needed.       Marland Kitchen oxyCODONE-acetaminophen (PERCOCET) 10-325 MG per tablet Take 1 tablet by mouth every 6 (six) hours as needed.       Marland Kitchen oxymorphone (  OPANA) 10 MG tablet Take 10 mg by mouth 2 (two) times daily.       . promethazine (PHENERGAN) 25 MG tablet Take 1 tablet (25 mg total) by mouth every 6 (six) hours as needed for nausea.  30 tablet  0  . promethazine-codeine (PHENERGAN WITH CODEINE) 6.25-10 MG/5ML syrup po q 6-8 hrs prn cough  240 mL  0  . triamterene-hydrochlorothiazide (DYAZIDE) 37.5-25 MG per capsule Take 1 each (1 capsule total) by mouth every morning.  30 capsule  1   No current facility-administered medications for this visit.    OBJECTIVE: Middle-aged [place] American woman who appears her stated age and is in no acute distress Filed Vitals:   08/15/13 1139  BP: 154/91  Pulse: 81  Temp: 98 F (36.7 C)  Resp: 20     Body mass index is 30.04 kg/(m^2).    ECOG FS: 1 Filed Weights   08/15/13 1139  Weight: 186 lb (84.369 kg)   Physical Exam: HEENT:  Sclerae anicteric.  Oropharynx clear. Buccal mucosa is pink and moist NODES:  No cervical or supraclavicular lymphadenopathy palpated.  BREAST EXAM:  Patient status post bilateral mastectomies. No evidence of local recurrence. Axillae are benign bilaterally, no palpable adenopathy LUNGS:  Clear to auscultation bilaterally.  No wheezes or rhonchi. Good excursion bilaterally. HEART:  Regular rate and rhythm. No murmur  ABDOMEN:  Soft, obese, nontender.  Positive bowel sounds.  MSK:  No focal spinal  tenderness to palpation. Full range of motion in the upper extremities bilaterally. EXTREMITIES:  Chronic lymphedema in the right upper extremity, with compression sleeve in place. No additional peripheral edema today.   NEURO:  Nonfocal. Well oriented.  Anxious affect.   LAB RESULTS: Lab Results  Component Value Date   WBC 5.0 08/15/2013   NEUTROABS 3.2 08/15/2013   HGB 13.7 08/15/2013   HCT 41.3 08/15/2013   MCV 85.3 08/15/2013   PLT 92* 08/15/2013      Chemistry      Component Value Date/Time   NA 141 07/25/2013 1159   NA 137 02/14/2013 1648   K 3.6 07/25/2013 1159   K 3.2* 02/14/2013 1648   CL 101 03/07/2013 1411   CL 97 02/14/2013 1648   CO2 31* 07/25/2013 1159   CO2 29 02/14/2013 1648   BUN 16.8 07/25/2013 1159   BUN 10 02/14/2013 1648   CREATININE 0.8 07/25/2013 1159   CREATININE 0.68 02/14/2013 1648      Component Value Date/Time   CALCIUM 10.2 07/25/2013 1159   CALCIUM 9.8 02/14/2013 1648   ALKPHOS 141 07/25/2013 1159   ALKPHOS 124* 02/14/2013 1648   AST 78* 07/25/2013 1159   AST 59* 02/14/2013 1648   ALT 34 07/25/2013 1159   ALT 28 02/14/2013 1648   BILITOT 0.85 07/25/2013 1159   BILITOT 0.4 02/14/2013 1648       STUDIES: Most recent echocardiogram on 07/18/2013 showed an ejection fraction of 55-60%.    Dg Chest 2 View  08/15/2013   CLINICAL DATA:  History of carcinoma of the breast with increasing shortness of breath  EXAM: CHEST  2 VIEW  COMPARISON:  None.  FINDINGS: The cardiac shadow is within normal limits. A left-sided chest port is seen with catheter tip at the cavoatrial junction. The lungs are well aerated bilaterally without focal infiltrate or sizable effusion. Degenerative change of the thoracic spine is seen. Postsurgical changes are noted on the right consistent with the patient's given clinical history.  IMPRESSION: No acute  abnormality is noted.   Electronically Signed   By: Alcide Clever [name]   On: 08/15/2013 11:34     ASSESSMENT: [name] is a 57 y.o.  [place], [place] woman with stage IV breast cancer manifested chiefly by loco-regional nodal disease (neck, chest), without lung, liver, bone, or brain involvement.  (1) Status post right lumpectomy and sentinel lymph node sampling 03/04/2002 for 2 separate foci of invasive ductal carcinoma, mpT2 pN1 or stage IIB, both foci grade 3, both estrogen and progesterone receptor positive, both HercepTest 3+, Mib-1 56%  (2) Reexcision for margins 05/27/2002 showed no residual cancer in the breast.  (3) The patient refused adjuvant systemic therapy.  (4) Adjuvant radiation treatment completed 08/14/2002.  (5) Diffuse recurrence in the right breast in 02/2004 showing a morphologically different tumor, again grade 3, again estrogen and progesterone receptor negative, with an MIB-1 of 14% and Herceptest 3+.  (5) Between 03/2004 and 07/2004 she received dose dense Doxorubicin/Cyclophosphamide x 4 given with Herceptin, followed by weekly Docetaxel x 8, again given with Herceptin.  (6) Right mastectomy 07/13/2004 showed scattered microscopic foci of residual disease over an area of greater than 5 cm. Margins were negative.  (7) Postoperative Docetaxel continued until 09/2004.  (8) Trastuzumab (Herceptin) given 08/2004 through 01/2012 with some brief interruptions.  (9) Isolated right cervical nodal recurrence 10/2005, treated with radiation to the right supraclavicular area (total 41.5 gray) completed 12/29/2005.  (10) Navelbine given together with Herceptin  11/2005 through 03/2006.  (9) Left mastectomy 02/13/2006 for ductal carcinoma in situ, grade 2, estrogen and progesterone receptor negative, with negative margins; 0 of 3 lymph nodes involved  (10) The patient treated with Lapatinib and Capecitabine before 10/2009, for an unclear duration and with unclear results (cannot locate data on chart review).  (11) Status post right supraclavicular lymph node biopsy 09/2010 again positive for an  invasive ductal carcinoma, estrogen and progesterone receptor negative, HER-2 positive by CISH with a ratio 4.25.  (12) Navelbine given together with Herceptin between 05/2011 and 11/2011.  (13) Carboplatin/Gemcitabine/Herceptin given for 2 cycles, in 12/2011 and 01/2012.  (14) TDM-1 (Kadcyla) started 02/2012. Most recent echo on 009/09/2013 showed a well preserved ejection fraction.  (15) Deep vein thrombosis of the right upper extremity documented 04/20/2011.  She completed anticoagulant therapy with Coumadin on 03/25/2013.  (16) Chronic right upper extremity lymphedema.  (17) Right chest port-a-cath removal due to infection on 01/28/2013. Left chest Port-A-Cath placed on 04/08/2013.   PLAN: [name] continues to do quite well with regards to her breast cancer. She is tolerating the  Kadcyla well, and we are making no changes to her current regimen. She does request, however, that we delay her treatment for one week. She tells me she is "just too tired and stressed" to do treatment today. Accordingly, per her request, we will schedule her next dose of q. three-week  Kadcyla for next week on October 16. She will be treated again 3 weeks later on November 6. We will continue to see her with every other day dose, and she'll be seen by Dr. Darnelle Catalan on November 26.   We are going to continue the TDM-1 until there is evidence of disease progression. Her chest x-ray today looks great.  Unless there are new symptoms we will not repeat a PET scan on until April of next year.  Her next echocardiogram will be due in mid December.   [name] voices understanding and agreement with our plan today, and will call with any changes  or problems.   Orby Tangen  PA-C 08/15/2013 12:14 PM

## 2013-08-15 NOTE — Telephone Encounter (Signed)
Per staff message and POF I have scheduled appts.  JMW  

## 2013-08-15 NOTE — Telephone Encounter (Signed)
, °

## 2013-08-22 ENCOUNTER — Other Ambulatory Visit: Payer: Commercial Managed Care - PPO | Admitting: Lab

## 2013-08-22 ENCOUNTER — Telehealth: Payer: Self-pay | Admitting: *Deleted

## 2013-08-22 ENCOUNTER — Ambulatory Visit: Payer: Commercial Managed Care - PPO

## 2013-08-22 IMAGING — US IR FLUORO GUIDE CV LINE*L*
1 series · 1 of 1 positions shown · non-contrast
Comparison: none

CLINICAL DATA: History of bilateral breast carcinoma and need for
port for chemotherapy.

[Series 1: sp fluoro guide cv line*left* · 1 of 1 slices shown]
[im 1/1]
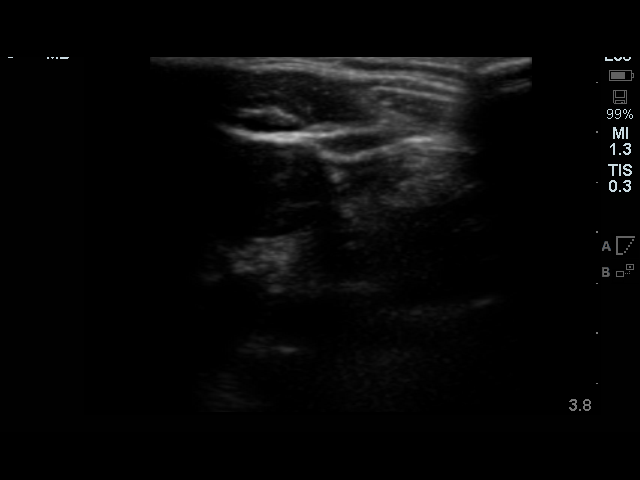

[1 of 1 positions shown; findings below may reference images not displayed]

IMPLANTED PORT A CATH PLACEMENT WITH ULTRASOUND AND FLUOROSCOPIC
GUIDANCE

Sedation:  3.0 mg IV Versed; 150 mcg IV Fentanyl.

Total Moderate Sedation Time:  40 minutes.

Additional Medications:  2 grams IV Ancef.  As antibiotic
prophylaxis, Ancef  was ordered pre-procedure and administered
intravenously within one hour of incision.

Fluoroscopy Time:  24 seconds.

Procedure:  The procedure, risks, benefits, and alternatives were
explained to the patient.  Questions regarding the procedure were
encouraged and answered.  The patient understands and consents to
the procedure.

The left neck and chest were prepped with chlorhexidine in a
sterile fashion, and a sterile drape was applied covering the
operative field.  Maximum barrier sterile technique with sterile
gowns and gloves were used for the procedure.  Local anesthesia was
provided with 1% lidocaine and lidocaine with epinephrine.

After creating a small venotomy incision, a 21 gauge needle was
advanced into the left internal jugular vein under direct, real-
time ultrasound guidance.  Ultrasound image documentation was
performed.  After securing guidewire access, an 8 Fr dilator was
placed.  A J-wire was kinked to measure appropriate catheter
length.

A subcutaneous port pocket was then created along the upper chest
wall utilizing sharp and blunt dissection.  Portable cautery was
utilized.  The pocket was irrigated with sterile saline.

A single lumen power injectable port was chosen for placement.  The
8 Fr catheter was tunneled from the port pocket site to the
venotomy incision.  The port was placed in the pocket and secured
with two Ethilon tacking sutures.  External catheter was trimmed to
appropriate length based on guidewire measurement.

At the venotomy, an 8 Fr peel-away sheath was placed over a
guidewire.  The catheter was then placed through the sheath and the
sheath removed.  Final catheter positioning was confirmed and
documented with a fluoroscopic spot image.  The port was accessed
with a needle and aspirated and flushed with heparinized saline.
The needle was removed.

The venotomy and port pocket incisions were closed with
subcutaneous 3-0 Monocryl and subcuticular 4-0 Vicryl.  Dermabond
was applied to both incisions.

Complications: None.  No pneumothorax.
FINDINGS: After catheter placement, the tip lies at the cavoatrial
junction.  The catheter aspirates normally and is ready for
immediate use.
IMPRESSION: Placement of single lumen port a cath via left internal jugular
vein.  The catheter tip lies at the cavoatrial junction.  A power
injectable port a cath was placed and is ready for immediate use.

## 2013-08-22 NOTE — Telephone Encounter (Signed)
Patient called and moved appts from today to tomorrow 

## 2013-08-23 ENCOUNTER — Other Ambulatory Visit (HOSPITAL_BASED_OUTPATIENT_CLINIC_OR_DEPARTMENT_OTHER): Payer: Commercial Managed Care - PPO | Admitting: Lab

## 2013-08-23 ENCOUNTER — Ambulatory Visit (HOSPITAL_BASED_OUTPATIENT_CLINIC_OR_DEPARTMENT_OTHER): Payer: Commercial Managed Care - PPO

## 2013-08-23 VITALS — BP 108/69 | HR 84 | Temp 98.8°F | Resp 18

## 2013-08-23 DIAGNOSIS — C50219 Malignant neoplasm of upper-inner quadrant of unspecified female breast: Secondary | ICD-10-CM

## 2013-08-23 DIAGNOSIS — C50919 Malignant neoplasm of unspecified site of unspecified female breast: Secondary | ICD-10-CM

## 2013-08-23 DIAGNOSIS — Z5112 Encounter for antineoplastic immunotherapy: Secondary | ICD-10-CM

## 2013-08-23 DIAGNOSIS — C773 Secondary and unspecified malignant neoplasm of axilla and upper limb lymph nodes: Secondary | ICD-10-CM

## 2013-08-23 LAB — COMPREHENSIVE METABOLIC PANEL (CC13)
ALT: 33 U/L (ref 0–55)
AST: 57 U/L — ABNORMAL HIGH (ref 5–34)
Albumin: 3.3 g/dL — ABNORMAL LOW (ref 3.5–5.0)
Alkaline Phosphatase: 159 U/L — ABNORMAL HIGH (ref 40–150)
Anion Gap: 9 mEq/L (ref 3–11)
BUN: 9.2 mg/dL (ref 7.0–26.0)
CO2: 29 mEq/L (ref 22–29)
Calcium: 9.8 mg/dL (ref 8.4–10.4)
Chloride: 104 mEq/L (ref 98–109)
Creatinine: 0.7 mg/dL (ref 0.6–1.1)
Glucose: 103 mg/dl (ref 70–140)
Potassium: 3.4 mEq/L — ABNORMAL LOW (ref 3.5–5.1)
Sodium: 142 mEq/L (ref 136–145)
Total Bilirubin: 1 mg/dL (ref 0.20–1.20)
Total Protein: 7.6 g/dL (ref 6.4–8.3)

## 2013-08-23 LAB — CBC WITH DIFFERENTIAL/PLATELET
BASO%: 0.9 % (ref 0.0–2.0)
Basophils Absolute: 0.1 10*3/uL (ref 0.0–0.1)
EOS%: 2.6 % (ref 0.0–7.0)
Eosinophils Absolute: 0.2 10*3/uL (ref 0.0–0.5)
HCT: 39.8 % (ref 34.8–46.6)
HGB: 13.2 g/dL (ref 11.6–15.9)
LYMPH%: 27.9 % (ref 14.0–49.7)
MCH: 28.7 pg (ref 25.1–34.0)
MCHC: 33.2 g/dL (ref 31.5–36.0)
MCV: 86.5 fL (ref 79.5–101.0)
MONO#: 0.6 10*3/uL (ref 0.1–0.9)
MONO%: 9.7 % (ref 0.0–14.0)
NEUT#: 3.9 10*3/uL (ref 1.5–6.5)
NEUT%: 58.9 % (ref 38.4–76.8)
Platelets: 87 10*3/uL — ABNORMAL LOW (ref 145–400)
RBC: 4.6 10*6/uL (ref 3.70–5.45)
RDW: 15.4 % — ABNORMAL HIGH (ref 11.2–14.5)
WBC: 6.6 10*3/uL (ref 3.9–10.3)
lymph#: 1.9 10*3/uL (ref 0.9–3.3)

## 2013-08-23 MED ORDER — ACETAMINOPHEN 325 MG PO TABS
ORAL_TABLET | ORAL | Status: AC
  Filled 2013-08-23: qty 2

## 2013-08-23 MED ORDER — SODIUM CHLORIDE 0.9 % IV SOLN
320.0000 mg | Freq: Once | INTRAVENOUS | Status: AC
  Administered 2013-08-23: 320 mg via INTRAVENOUS
  Filled 2013-08-23: qty 16

## 2013-08-23 MED ORDER — ACETAMINOPHEN 325 MG PO TABS
650.0000 mg | ORAL_TABLET | Freq: Once | ORAL | Status: AC
  Administered 2013-08-23: 650 mg via ORAL

## 2013-08-23 MED ORDER — DIPHENHYDRAMINE HCL 25 MG PO CAPS
25.0000 mg | ORAL_CAPSULE | Freq: Once | ORAL | Status: AC
  Administered 2013-08-23: 25 mg via ORAL

## 2013-08-23 MED ORDER — SODIUM CHLORIDE 0.9 % IV SOLN
Freq: Once | INTRAVENOUS | Status: AC
  Administered 2013-08-23: 14:00:00 via INTRAVENOUS

## 2013-08-23 MED ORDER — DIPHENHYDRAMINE HCL 25 MG PO CAPS
ORAL_CAPSULE | ORAL | Status: AC
  Filled 2013-08-23: qty 1

## 2013-08-23 MED ORDER — HEPARIN SOD (PORK) LOCK FLUSH 100 UNIT/ML IV SOLN
500.0000 [IU] | Freq: Once | INTRAVENOUS | Status: AC | PRN
  Administered 2013-08-23: 500 [IU]
  Filled 2013-08-23: qty 5

## 2013-08-23 MED ORDER — SODIUM CHLORIDE 0.9 % IJ SOLN
10.0000 mL | INTRAMUSCULAR | Status: DC | PRN
  Administered 2013-08-23: 10 mL
  Filled 2013-08-23: qty 10

## 2013-08-23 NOTE — Progress Notes (Signed)
Patient ok to treat despite plt counts=87 per Dr Darnelle Catalan.

## 2013-08-23 NOTE — Patient Instructions (Signed)
[facility] Discharge Instructions for Patients Receiving Chemotherapy  Today you received the following chemotherapy agents Kadcyla  To help prevent nausea and vomiting after your treatment, we encourage you to take your nausea medication if needed   If you develop nausea and vomiting that is not controlled by your nausea medication, call the clinic.   BELOW ARE SYMPTOMS THAT SHOULD BE REPORTED IMMEDIATELY:  *FEVER GREATER THAN 100.5 F  *CHILLS WITH OR WITHOUT FEVER  NAUSEA AND VOMITING THAT IS NOT CONTROLLED WITH YOUR NAUSEA MEDICATION  *UNUSUAL SHORTNESS OF BREATH  *UNUSUAL BRUISING OR BLEEDING  TENDERNESS IN MOUTH AND THROAT WITH OR WITHOUT PRESENCE OF ULCERS  *URINARY PROBLEMS  *BOWEL PROBLEMS  UNUSUAL RASH Items with * indicate a potential emergency and should be followed up as soon as possible.  Feel free to call the clinic you have any questions or concerns. The clinic phone number is [phone].    

## 2013-08-26 ENCOUNTER — Encounter: Payer: Self-pay | Admitting: *Deleted

## 2013-08-26 NOTE — Progress Notes (Signed)
Patient received application for financial assistance from [facility].  CSW and pt reviewed application, and pt completed the pt portion of the application.  CSW completed the healthcare portion and faxed completed application to [facility].  [facility] will contact pt once application has been processed.    Tamala Julian, MSW, LCSW Clinical Social Worker [facility] [phone]

## 2013-09-03 ENCOUNTER — Encounter (HOSPITAL_COMMUNITY): Payer: Self-pay | Admitting: Cardiology

## 2013-09-03 ENCOUNTER — Telehealth (HOSPITAL_COMMUNITY): Payer: Self-pay | Admitting: Cardiology

## 2013-09-03 NOTE — Telephone Encounter (Signed)
Attempting to schedule 3 month follow up with ECHO I have been unable to reach this patient by phone.  A letter is being sent to the last known home address.  

## 2013-09-09 ENCOUNTER — Other Ambulatory Visit: Payer: Self-pay | Admitting: *Deleted

## 2013-09-09 DIAGNOSIS — C50919 Malignant neoplasm of unspecified site of unspecified female breast: Secondary | ICD-10-CM

## 2013-09-09 DIAGNOSIS — R110 Nausea: Secondary | ICD-10-CM

## 2013-09-09 MED ORDER — AZITHROMYCIN 250 MG PO TABS: ORAL_TABLET | ORAL | Status: DC

## 2013-09-09 MED ORDER — PROMETHAZINE-CODEINE 6.25-10 MG/5ML PO SYRP: ORAL_SOLUTION | ORAL | Status: DC

## 2013-09-12 ENCOUNTER — Other Ambulatory Visit (HOSPITAL_BASED_OUTPATIENT_CLINIC_OR_DEPARTMENT_OTHER): Payer: Commercial Managed Care - PPO | Admitting: Lab

## 2013-09-12 ENCOUNTER — Encounter: Payer: Self-pay | Admitting: *Deleted

## 2013-09-12 ENCOUNTER — Encounter (INDEPENDENT_AMBULATORY_CARE_PROVIDER_SITE_OTHER): Payer: Self-pay

## 2013-09-12 ENCOUNTER — Ambulatory Visit (HOSPITAL_BASED_OUTPATIENT_CLINIC_OR_DEPARTMENT_OTHER): Payer: Commercial Managed Care - PPO

## 2013-09-12 VITALS — BP 126/85 | HR 97 | Temp 98.4°F | Resp 18

## 2013-09-12 DIAGNOSIS — Z5112 Encounter for antineoplastic immunotherapy: Secondary | ICD-10-CM

## 2013-09-12 DIAGNOSIS — C50919 Malignant neoplasm of unspecified site of unspecified female breast: Secondary | ICD-10-CM

## 2013-09-12 DIAGNOSIS — C50219 Malignant neoplasm of upper-inner quadrant of unspecified female breast: Secondary | ICD-10-CM

## 2013-09-12 DIAGNOSIS — C773 Secondary and unspecified malignant neoplasm of axilla and upper limb lymph nodes: Secondary | ICD-10-CM

## 2013-09-12 LAB — CBC WITH DIFFERENTIAL/PLATELET
BASO%: 0.7 % (ref 0.0–2.0)
Basophils Absolute: 0 10*3/uL (ref 0.0–0.1)
EOS%: 5.1 % (ref 0.0–7.0)
Eosinophils Absolute: 0.3 10*3/uL (ref 0.0–0.5)
HCT: 41.4 % (ref 34.8–46.6)
HGB: 13.5 g/dL (ref 11.6–15.9)
LYMPH%: 41.8 % (ref 14.0–49.7)
MCH: 28 pg (ref 25.1–34.0)
MCHC: 32.6 g/dL (ref 31.5–36.0)
MCV: 85.7 fL (ref 79.5–101.0)
MONO#: 0.8 10*3/uL (ref 0.1–0.9)
MONO%: 12.7 % (ref 0.0–14.0)
NEUT#: 2.4 10*3/uL (ref 1.5–6.5)
NEUT%: 39.7 % (ref 38.4–76.8)
Platelets: 130 10*3/uL — ABNORMAL LOW (ref 145–400)
RBC: 4.83 10*6/uL (ref 3.70–5.45)
RDW: 15.6 % — ABNORMAL HIGH (ref 11.2–14.5)
WBC: 6.1 10*3/uL (ref 3.9–10.3)
lymph#: 2.5 10*3/uL (ref 0.9–3.3)
nRBC: 0 % (ref 0–0)

## 2013-09-12 LAB — COMPREHENSIVE METABOLIC PANEL (CC13)
ALT: 40 U/L (ref 0–55)
AST: 74 U/L — ABNORMAL HIGH (ref 5–34)
Albumin: 3.4 g/dL — ABNORMAL LOW (ref 3.5–5.0)
Alkaline Phosphatase: 197 U/L — ABNORMAL HIGH (ref 40–150)
Anion Gap: 12 mEq/L — ABNORMAL HIGH (ref 3–11)
BUN: 10.3 mg/dL (ref 7.0–26.0)
CO2: 26 mEq/L (ref 22–29)
Calcium: 10 mg/dL (ref 8.4–10.4)
Chloride: 104 mEq/L (ref 98–109)
Creatinine: 0.8 mg/dL (ref 0.6–1.1)
Glucose: 129 mg/dl (ref 70–140)
Potassium: 3.2 mEq/L — ABNORMAL LOW (ref 3.5–5.1)
Sodium: 143 mEq/L (ref 136–145)
Total Bilirubin: 0.74 mg/dL (ref 0.20–1.20)
Total Protein: 7.9 g/dL (ref 6.4–8.3)

## 2013-09-12 MED ORDER — DIPHENHYDRAMINE HCL 25 MG PO CAPS
25.0000 mg | ORAL_CAPSULE | Freq: Once | ORAL | Status: AC
  Administered 2013-09-12: 25 mg via ORAL

## 2013-09-12 MED ORDER — ACETAMINOPHEN 325 MG PO TABS
650.0000 mg | ORAL_TABLET | Freq: Once | ORAL | Status: AC
  Administered 2013-09-12: 650 mg via ORAL

## 2013-09-12 MED ORDER — SODIUM CHLORIDE 0.9 % IJ SOLN
10.0000 mL | INTRAMUSCULAR | Status: DC | PRN
  Administered 2013-09-12: 10 mL
  Filled 2013-09-12: qty 10

## 2013-09-12 MED ORDER — HEPARIN SOD (PORK) LOCK FLUSH 100 UNIT/ML IV SOLN
500.0000 [IU] | Freq: Once | INTRAVENOUS | Status: AC | PRN
  Administered 2013-09-12: 500 [IU]
  Filled 2013-09-12: qty 5

## 2013-09-12 MED ORDER — ACETAMINOPHEN 325 MG PO TABS
ORAL_TABLET | ORAL | Status: AC
  Filled 2013-09-12: qty 2

## 2013-09-12 MED ORDER — DIPHENHYDRAMINE HCL 25 MG PO CAPS
ORAL_CAPSULE | ORAL | Status: AC
  Filled 2013-09-12: qty 1

## 2013-09-12 MED ORDER — SODIUM CHLORIDE 0.9 % IV SOLN
Freq: Once | INTRAVENOUS | Status: AC
  Administered 2013-09-12: 12:00:00 via INTRAVENOUS

## 2013-09-12 MED ORDER — ADO-TRASTUZUMAB EMTANSINE CHEMO INJECTION 160 MG
320.0000 mg | Freq: Once | INTRAVENOUS | Status: AC
  Administered 2013-09-12: 320 mg via INTRAVENOUS
  Filled 2013-09-12: qty 16

## 2013-09-12 NOTE — Progress Notes (Signed)
1338 Patient refusing to stay for 30 minute post Kadcyla observation.

## 2013-09-12 NOTE — Patient Instructions (Signed)
[facility] Discharge Instructions for Patients Receiving Chemotherapy  Today you received Kadcyla.   If you develop nausea and vomiting that is not controlled by your nausea medication, call the clinic.   BELOW ARE SYMPTOMS THAT SHOULD BE REPORTED IMMEDIATELY:  *FEVER GREATER THAN 100.5 F  *CHILLS WITH OR WITHOUT FEVER  NAUSEA AND VOMITING THAT IS NOT CONTROLLED WITH YOUR NAUSEA MEDICATION  *UNUSUAL SHORTNESS OF BREATH  *UNUSUAL BRUISING OR BLEEDING  TENDERNESS IN MOUTH AND THROAT WITH OR WITHOUT PRESENCE OF ULCERS  *URINARY PROBLEMS  *BOWEL PROBLEMS  UNUSUAL RASH Items with * indicate a potential emergency and should be followed up as soon as possible.  Feel free to call the clinic you have any questions or concerns. The clinic phone number is [phone].

## 2013-09-12 NOTE — Progress Notes (Signed)
[facility] Clinical Social Work  Clinical Social Work met with Pt to re-review and resend [facility] application on behalf of patient. She reports [facility] states they had not received application. CSW refaxed application to [facility] and they will contact patient to process application. CSW visualized confirmation of fax being sent successfully to [facility].   Pt very appreciative and denies other concerns at this time.   Doreen Salvage, LCSW Clinical Social Worker [facility] Wednesday, Thursday and Friday Phone: [phone] Fax: [phone]

## 2013-09-14 ENCOUNTER — Other Ambulatory Visit: Payer: Self-pay | Admitting: Oncology

## 2013-09-14 DIAGNOSIS — C50919 Malignant neoplasm of unspecified site of unspecified female breast: Secondary | ICD-10-CM

## 2013-09-23 ENCOUNTER — Other Ambulatory Visit: Payer: Self-pay | Admitting: Oncology

## 2013-09-23 DIAGNOSIS — C50919 Malignant neoplasm of unspecified site of unspecified female breast: Secondary | ICD-10-CM

## 2013-10-02 ENCOUNTER — Ambulatory Visit (HOSPITAL_BASED_OUTPATIENT_CLINIC_OR_DEPARTMENT_OTHER): Payer: Commercial Managed Care - PPO

## 2013-10-02 ENCOUNTER — Ambulatory Visit (HOSPITAL_BASED_OUTPATIENT_CLINIC_OR_DEPARTMENT_OTHER): Payer: Commercial Managed Care - PPO | Admitting: Oncology

## 2013-10-02 ENCOUNTER — Ambulatory Visit (HOSPITAL_BASED_OUTPATIENT_CLINIC_OR_DEPARTMENT_OTHER): Payer: Commercial Managed Care - PPO | Admitting: Lab

## 2013-10-02 VITALS — BP 151/88 | HR 96 | Temp 97.8°F | Resp 18 | Ht 66.0 in | Wt 192.4 lb

## 2013-10-02 DIAGNOSIS — Z5112 Encounter for antineoplastic immunotherapy: Secondary | ICD-10-CM

## 2013-10-02 DIAGNOSIS — Z171 Estrogen receptor negative status [ER-]: Secondary | ICD-10-CM

## 2013-10-02 DIAGNOSIS — C770 Secondary and unspecified malignant neoplasm of lymph nodes of head, face and neck: Secondary | ICD-10-CM

## 2013-10-02 DIAGNOSIS — C50219 Malignant neoplasm of upper-inner quadrant of unspecified female breast: Secondary | ICD-10-CM

## 2013-10-02 DIAGNOSIS — C773 Secondary and unspecified malignant neoplasm of axilla and upper limb lymph nodes: Secondary | ICD-10-CM

## 2013-10-02 DIAGNOSIS — C50211 Malignant neoplasm of upper-inner quadrant of right female breast: Secondary | ICD-10-CM

## 2013-10-02 DIAGNOSIS — C50919 Malignant neoplasm of unspecified site of unspecified female breast: Secondary | ICD-10-CM

## 2013-10-02 DIAGNOSIS — Z86718 Personal history of other venous thrombosis and embolism: Secondary | ICD-10-CM

## 2013-10-02 DIAGNOSIS — I82409 Acute embolism and thrombosis of unspecified deep veins of unspecified lower extremity: Secondary | ICD-10-CM

## 2013-10-02 DIAGNOSIS — Z853 Personal history of malignant neoplasm of breast: Secondary | ICD-10-CM

## 2013-10-02 DIAGNOSIS — I890 Lymphedema, not elsewhere classified: Secondary | ICD-10-CM

## 2013-10-02 LAB — CBC WITH DIFFERENTIAL/PLATELET
BASO%: 0.6 % (ref 0.0–2.0)
Basophils Absolute: 0 10*3/uL (ref 0.0–0.1)
EOS%: 3.1 % (ref 0.0–7.0)
Eosinophils Absolute: 0.2 10*3/uL (ref 0.0–0.5)
HCT: 39.6 % (ref 34.8–46.6)
HGB: 13.1 g/dL (ref 11.6–15.9)
LYMPH%: 38.2 % (ref 14.0–49.7)
MCH: 28.5 pg (ref 25.1–34.0)
MCHC: 33.1 g/dL (ref 31.5–36.0)
MCV: 86.3 fL (ref 79.5–101.0)
MONO#: 0.6 10*3/uL (ref 0.1–0.9)
MONO%: 11.4 % (ref 0.0–14.0)
NEUT#: 2.4 10*3/uL (ref 1.5–6.5)
NEUT%: 46.7 % (ref 38.4–76.8)
Platelets: 117 10*3/uL — ABNORMAL LOW (ref 145–400)
RBC: 4.59 10*6/uL (ref 3.70–5.45)
RDW: 16.5 % — ABNORMAL HIGH (ref 11.2–14.5)
WBC: 5.2 10*3/uL (ref 3.9–10.3)
lymph#: 2 10*3/uL (ref 0.9–3.3)
nRBC: 0 % (ref 0–0)

## 2013-10-02 LAB — COMPREHENSIVE METABOLIC PANEL
ALT: 28 U/L (ref 0–35)
AST: 64 U/L — ABNORMAL HIGH (ref 0–37)
Albumin: 3.6 g/dL (ref 3.5–5.2)
Alkaline Phosphatase: 157 U/L — ABNORMAL HIGH (ref 39–117)
BUN: 9 mg/dL (ref 6–23)
CO2: 30 mEq/L (ref 19–32)
Calcium: 10 mg/dL (ref 8.4–10.5)
Chloride: 100 mEq/L (ref 96–112)
Creatinine, Ser: 0.71 mg/dL (ref 0.50–1.10)
Glucose, Bld: 115 mg/dL — ABNORMAL HIGH (ref 70–99)
Potassium: 3.5 mEq/L (ref 3.5–5.3)
Sodium: 138 mEq/L (ref 135–145)
Total Bilirubin: 0.8 mg/dL (ref 0.3–1.2)
Total Protein: 8 g/dL (ref 6.0–8.3)

## 2013-10-02 MED ORDER — DIPHENHYDRAMINE HCL 25 MG PO CAPS
ORAL_CAPSULE | ORAL | Status: AC
  Filled 2013-10-02: qty 1

## 2013-10-02 MED ORDER — ACETAMINOPHEN 325 MG PO TABS
ORAL_TABLET | ORAL | Status: AC
  Filled 2013-10-02: qty 2

## 2013-10-02 MED ORDER — SODIUM CHLORIDE 0.9 % IJ SOLN
10.0000 mL | INTRAMUSCULAR | Status: DC | PRN
  Administered 2013-10-02: 10 mL
  Filled 2013-10-02: qty 10

## 2013-10-02 MED ORDER — ACETAMINOPHEN 325 MG PO TABS
650.0000 mg | ORAL_TABLET | Freq: Once | ORAL | Status: AC
  Administered 2013-10-02: 650 mg via ORAL

## 2013-10-02 MED ORDER — HEPARIN SOD (PORK) LOCK FLUSH 100 UNIT/ML IV SOLN
500.0000 [IU] | Freq: Once | INTRAVENOUS | Status: AC | PRN
  Administered 2013-10-02: 500 [IU]
  Filled 2013-10-02: qty 5

## 2013-10-02 MED ORDER — SODIUM CHLORIDE 0.9 % IV SOLN
Freq: Once | INTRAVENOUS | Status: AC
  Administered 2013-10-02: 17:00:00 via INTRAVENOUS

## 2013-10-02 MED ORDER — SODIUM CHLORIDE 0.9 % IV SOLN
320.0000 mg | Freq: Once | INTRAVENOUS | Status: AC
  Administered 2013-10-02: 320 mg via INTRAVENOUS
  Filled 2013-10-02: qty 16

## 2013-10-02 MED ORDER — DIPHENHYDRAMINE HCL 25 MG PO CAPS
25.0000 mg | ORAL_CAPSULE | Freq: Once | ORAL | Status: AC
  Administered 2013-10-02: 25 mg via ORAL

## 2013-10-02 NOTE — Patient Instructions (Signed)
[facility] Discharge Instructions for Patients Receiving Chemotherapy  Today you received the following chemotherapy agents Kadcyla.  To help prevent nausea and vomiting after your treatment, we encourage you to take your nausea medication.   If you develop nausea and vomiting that is not controlled by your nausea medication, call the clinic.   BELOW ARE SYMPTOMS THAT SHOULD BE REPORTED IMMEDIATELY:  *FEVER GREATER THAN 100.5 F  *CHILLS WITH OR WITHOUT FEVER  NAUSEA AND VOMITING THAT IS NOT CONTROLLED WITH YOUR NAUSEA MEDICATION  *UNUSUAL SHORTNESS OF BREATH  *UNUSUAL BRUISING OR BLEEDING  TENDERNESS IN MOUTH AND THROAT WITH OR WITHOUT PRESENCE OF ULCERS  *URINARY PROBLEMS  *BOWEL PROBLEMS  UNUSUAL RASH Items with * indicate a potential emergency and should be followed up as soon as possible.  Feel free to call the clinic you have any questions or concerns. The clinic phone number is [phone].    

## 2013-10-02 NOTE — Progress Notes (Signed)
[facility]  [phone] [phone]  OFFICE PROGRESS NOTE   ID: [name]   DOB: 08-19-1956  MR#: [id number]  JYN#:[id number]   PCP: Billee Cashing, MD SU: [name], [name], [name] RAD QMV:HQIONGE Kathrynn Running, [name] OTHER:  Arvilla Meres, MD  CHIEF COMPLAINT:  Stage IV Breast Cancer   HISTORY OF PRESENT ILLNESS: The patient originally presented in May 2003 when she noticed a lump in the upper inner quadrant of her right breast in September 2003.  She sought attention and had a mammogram which showed an obvious carcinoma in the upper inner quadrant of the right breast, approximately 2 cm.  There were some enlarged lymph nodes in the axilla and an FNA done showed those consistent with malignant cells, most likely an invasive ductal carcinoma.  At that point she was unsure of what to do and was referred by Dr. Francina Ames for a discussion of her treatment options.  By biopsy, it was ER/PR negative and HER-2 was 3+.  DNA index was 1.42.  We reiterated that it was most important to have her disease surgically addressed at that point and had recommended lumpectomy and axillary nodes to be addressed with which she followed through and on 04-03-02, Dr. Maple Hudson performed a right partial mastectomy and a right axillary lymph node dissection.  Final pathology revealed a 2.4 cm. high grade, Grade III invasive ductal carcinoma with an adjacent .8 cm. also high grade invasive ductal carcinoma which was felt to represent an intramammary metastasis rather than a second primary.  A smaller mass was just medial to the larger mass.  The smaller mass was associated with high grade DCIS component.  There was no definite lymphovascular invasion identified.  However, one of fourteen axillary lymph nodes did contain a 1.5 cm. metastatic deposit.  Postoperatively she did very well.  We reiterated the fact that she did need adjuvant chemotherapy, however, she refused and  decided that she would pursue radiation.  She received radiation and completed that on 08-14-02. Her subsequent history is as detailed below.   INTERVAL HISTORY: [name] returns today accompanied by her daughter and a grandson for followup of [name] stage IV breast cancer. She continues to receive Kadcyla every 3 weeks (started April 2013)  and is due for her next dose today. Her most recent echocardiogram in September 2014 showed a well preserved ejection fraction  REVIEW OF SYSTEMS: [name] has swelling of the right arm and wears her sleeve intermittently. She feels it helps. Just as throbbing pain in her hands "all the time". She has a runny nose, some sinus problems, hoarseness, and some shortness of breath when walking stairs, but denies any kind of cough or pleurisy. She tells me her appetite is poor. She bruises easily. She feels forgetful, anxious and "a little" depressed. Sometimes she wonders if she should give up. She has no plan to take her life and would let us know if this becomes more of a concern to her. On the plus side she cooked 6 sweet potato pies yesterday. She is looking forward to Thanksgiving. A detailed review systems is otherwise noncontributory  PAST MEDICAL HISTORY: Past Medical History  Diagnosis Date  . DVT (deep venous thrombosis) 10/07/2011  . Breast cancer   . Hypertension   . Rheumatic fever     PAST SURGICAL HISTORY: Past Surgical History  Procedure Laterality Date  . Mastectomy      bilateral  . Breast surgery    . Port a  cath revision      FAMILY HISTORY Family History  Problem Relation Age of Onset  . Pancreatic cancer Mother   . Kidney cancer Sister 33  . Breast cancer Sister 70  . Breast cancer Other 73  The patient's father died in his 55s  from complications of alcohol abuse. The patient's mother died in her 58s from pancreatic cancer. The patient has 6 brothers, 2 sisters. Both her sisters have had breast cancer, both diagnosed after  the age of 80. The patient has not had genetic testing so far.   GYNECOLOGIC HISTORY: Menarche age 78; first live birth age 32. She is GXP4. She is not sure when she stopped having periods. She never used hormone replacement.   SOCIAL HISTORY: The patient has worked in the past as a Agricultural engineer. At home in addition to the patient are her husband [name], originally from [place], who works as a Naval architect.  Also living in the home is her son [name] and her daughter Maryland Pink (she is studying to be a Engineer, civil (consulting)).  The other 2 children are [name], who has a college degree in psychology and works as a Clinical research associate; and [name], who lives in [place] and works as a Nurse, adult.   ADVANCED DIRECTIVES: Not in place  HEALTH MAINTENANCE: History  Substance Use Topics  . Smoking status: Light Tobacco Smoker  . Smokeless tobacco: Never Used  . Alcohol Use: Yes     Comment: Rarely     Colonoscopy:  Not on file  PAP:  Not on file  Bone density:  Not on file  Lipid panel:  Not on file    Allergies  Allergen Reactions  . Tramadol Nausea Only  . Hydrocodone-Acetaminophen Itching and Rash    Current Outpatient Prescriptions  Medication Sig Dispense Refill  . alprazolam (XANAX) 2 MG tablet Take 2 mg by mouth daily.       Marland Kitchen amLODipine (NORVASC) 10 MG tablet Take 10 mg by mouth daily.        . Ascorbic Acid (VITAMIN C) 100 MG tablet Take 100 mg by mouth daily.        Marland Kitchen azithromycin (ZITHROMAX Z-PAK) 250 MG tablet Take as directed  6 each  0  . calcium-vitamin D (OSCAL WITH D) 500-200 MG-UNIT per tablet Take 1 tablet by mouth daily.        Marland Kitchen gabapentin (NEURONTIN) 100 MG capsule TAKE 1 CAPSULE BY MOUTH AT BEDTIME  30 capsule  1  . hydrochlorothiazide (HYDRODIURIL) 25 MG tablet TAKE 1 TABLET BY MOUTH 3 TIMES A WEEK ON MONDAY, WEDNESDAY, AND FRIDAY  30 tablet  1  . LORazepam (ATIVAN) 0.5 MG tablet Take 0.5 mg by mouth every 8 (eight) hours as needed.       . metaxalone (SKELAXIN) 800 MG tablet Take  800 mg by mouth 3 (three) times daily.       . Multiple Vitamin (MULTIVITAMIN) capsule Take 1 capsule by mouth daily.        . mupirocin ointment (BACTROBAN) 2 % Apply 1 application topically as needed.       Marland Kitchen oxyCODONE-acetaminophen (PERCOCET) 10-325 MG per tablet Take 1 tablet by mouth every 6 (six) hours as needed.       Marland Kitchen oxymorphone (OPANA) 10 MG tablet Take 10 mg by mouth 2 (two) times daily.       . promethazine (PHENERGAN) 25 MG tablet Take 1 tablet (25 mg total) by mouth every 6 (six) hours as needed for nausea.  30 tablet  0  . promethazine-codeine (PHENERGAN WITH CODEINE) 6.25-10 MG/5ML syrup po q 6-8 hrs prn cough  240 mL  0  . triamterene-hydrochlorothiazide (DYAZIDE) 37.5-25 MG per capsule TAKE 1 EACH (1 CAPSULE TOTAL) BY MOUTH EVERY MORNING.  30 capsule  1   No current facility-administered medications for this visit.    OBJECTIVE: Middle-aged [place] American woman in no acute distress Filed Vitals:   10/02/13 1525  BP: 151/88  Pulse: 96  Temp: 97.8 F (36.6 C)  Resp: 18     Body mass index is 31.07 kg/(m^2).    ECOG FS: 1 Filed Weights   10/02/13 1525  Weight: 192 lb 6.4 oz (87.272 kg)   Sclerae unicteric, pupils equal and reactive Oropharynx clear and moist-- no thrush No cervical or supraclavicular adenopathy Lungs no rales or rhonchi Heart regular rate and rhythm Abd soft, obese, nontender, positive bowel sounds MSK no focal spinal tenderness, chronic grade 1 right upper extremity lymphedema, with compression sleeve in place Neuro: nonfocal, well oriented, positive affect Breasts: Deferred    LAB RESULTS: Lab Results  Component Value Date   WBC 5.2 10/02/2013   NEUTROABS 2.4 10/02/2013   HGB 13.1 10/02/2013   HCT 39.6 10/02/2013   MCV 86.3 10/02/2013   PLT 117* 10/02/2013      Chemistry      Component Value Date/Time   NA 143 09/12/2013 1121   NA 137 02/14/2013 1648   K 3.2* 09/12/2013 1121   K 3.2* 02/14/2013 1648   CL 101 03/07/2013 1411    CL 97 02/14/2013 1648   CO2 26 09/12/2013 1121   CO2 29 02/14/2013 1648   BUN 10.3 09/12/2013 1121   BUN 10 02/14/2013 1648   CREATININE 0.8 09/12/2013 1121   CREATININE 0.68 02/14/2013 1648      Component Value Date/Time   CALCIUM 10.0 09/12/2013 1121   CALCIUM 9.8 02/14/2013 1648   ALKPHOS 197* 09/12/2013 1121   ALKPHOS 124* 02/14/2013 1648   AST 74* 09/12/2013 1121   AST 59* 02/14/2013 1648   ALT 40 09/12/2013 1121   ALT 28 02/14/2013 1648   BILITOT 0.74 09/12/2013 1121   BILITOT 0.4 02/14/2013 1648       STUDIES: Most recent echocardiogram on 07/18/2013 showed an ejection fraction of 55-60%.  ASSESSMENT:  57 y.o. [place] woman with stage IV breast cancer manifested chiefly by loco-regional nodal disease (neck, chest), without lung, liver, bone, or brain involvement.  (1) Status post right lumpectomy and sentinel lymph node sampling 03/04/2002 for 2 separate foci of invasive ductal carcinoma, mpT2 pN1 or stage IIB, both foci grade 3, both estrogen and progesterone receptor positive, both HercepTest 3+, Mib-1 56%  (2) Reexcision for margins 05/27/2002 showed no residual cancer in the breast.  (3) The patient refused adjuvant systemic therapy.  (4) Adjuvant radiation treatment completed 08/14/2002.  (5) recurrence in the right breast in 02/2004 showing a morphologically different tumor, again grade 3, again estrogen and progesterone receptor negative, with an MIB-1 of 14% and Herceptest 3+.  (5) Between 03/2004 and 07/2004 she received dose dense Doxorubicin/Cyclophosphamide x 4 given with trastuzumab, followed by weekly Docetaxel x 8, again given with trastuzumab.  (6) Right mastectomy 07/13/2004 showed scattered microscopic foci of residual disease over an area  greater than 5 cm. Margins were negative.  (7) Postoperative Docetaxel continued until 09/2004.  (8) Trastuzumab (Herceptin) given 08/2004 through 01/2012 with some brief interruptions.  (9) Isolated right cervical nodal  recurrence 10/2005, treated with  radiation to the right supraclavicular area (total 41.5 gray) completed 12/29/2005.  (10) Navelbine given together with Herceptin  11/2005 through 03/2006.  (9) Left mastectomy 02/13/2006 for ductal carcinoma in situ, grade 2, estrogen and progesterone receptor negative, with negative margins; 0 of 3 lymph nodes involved  (10) treated with Lapatinib and Capecitabine before 10/2009, for an unclear duration and with unclear results (cannot locate data on chart review).  (11) Status post right supraclavicular lymph node biopsy 09/2010 again positive for an invasive ductal carcinoma, estrogen and progesterone receptor negative, HER-2 positive by CISH with a ratio 4.25.  (12) Navelbine given together with Herceptin between 05/2011 and 11/2011.  (13) Carboplatin/ Gemcitabine/ Herceptin given for 2 cycles, in 12/2011 and 01/2012.  (14) TDM-1 (Kadcyla) started 02/2012. Most recent echo on 009/09/2013 showed a well preserved ejection fraction.  (15) Deep vein thrombosis of the right upper extremity documented 04/20/2011.  She completed anticoagulant therapy with Coumadin on 03/25/2013.  (16) Chronic right upper extremity lymphedema.  (17) Right chest port-a-cath removal due to infection on 01/28/2013. Left chest Port-A-Cath placed on 04/08/2013.   PLAN: [name] is doing remarkably well, with no clinical evidence of disease progression, and with excellent tolerance of the TDM 1. The overall plan is to continue this until there is significant toxicity or progression of the cancer.  She is already scheduled for repeat echocardiogram in December. We are seeing her every 6 weeks at the same time as her TDM 1 treatments. If there is any suspicion of progression we will proceed to CT scan of the chest, but at present we're continuing to follow the patient with review of systems, labs, and physical exam. She knows to alert [name] to any problems that may develop before her next  visit here.      Lowella Dell, MD  10/02/2013 3:52 PM

## 2013-10-02 NOTE — Telephone Encounter (Signed)
appts made and printed. Pt is aware that tx will be added. i emailed MW to add the tx's...td 

## 2013-10-03 DIAGNOSIS — Z170 Estrogen receptor positive status [ER+]: Secondary | ICD-10-CM | POA: Insufficient documentation

## 2013-10-03 DIAGNOSIS — C50211 Malignant neoplasm of upper-inner quadrant of right female breast: Secondary | ICD-10-CM | POA: Insufficient documentation

## 2013-10-04 ENCOUNTER — Telehealth: Payer: Self-pay | Admitting: *Deleted

## 2013-10-04 NOTE — Telephone Encounter (Signed)
Per staff message and POF I have scheduled appts.  JMW  

## 2013-10-14 ENCOUNTER — Ambulatory Visit (HOSPITAL_BASED_OUTPATIENT_CLINIC_OR_DEPARTMENT_OTHER)
Admission: RE | Admit: 2013-10-14 | Discharge: 2013-10-14 | Disposition: A | Discharge status: Home / Self Care | Payer: Commercial Managed Care - PPO | Source: Ambulatory Visit | Attending: Family Medicine | Admitting: Family Medicine

## 2013-10-14 ENCOUNTER — Encounter (HOSPITAL_COMMUNITY): Payer: Self-pay

## 2013-10-14 ENCOUNTER — Ambulatory Visit (HOSPITAL_COMMUNITY)
Admission: RE | Admit: 2013-10-14 | Discharge: 2013-10-14 | Disposition: A | Discharge status: Home / Self Care | Payer: Commercial Managed Care - PPO | Source: Ambulatory Visit | Attending: Family Medicine | Admitting: Family Medicine

## 2013-10-14 VITALS — BP 128/84 | HR 91 | Wt 194.8 lb

## 2013-10-14 DIAGNOSIS — R079 Chest pain, unspecified: Secondary | ICD-10-CM

## 2013-10-14 DIAGNOSIS — I359 Nonrheumatic aortic valve disorder, unspecified: Secondary | ICD-10-CM | POA: Insufficient documentation

## 2013-10-14 DIAGNOSIS — C50219 Malignant neoplasm of upper-inner quadrant of unspecified female breast: Secondary | ICD-10-CM | POA: Diagnosis present

## 2013-10-14 DIAGNOSIS — I059 Rheumatic mitral valve disease, unspecified: Secondary | ICD-10-CM | POA: Insufficient documentation

## 2013-10-14 DIAGNOSIS — I517 Cardiomegaly: Secondary | ICD-10-CM | POA: Insufficient documentation

## 2013-10-14 DIAGNOSIS — C50919 Malignant neoplasm of unspecified site of unspecified female breast: Secondary | ICD-10-CM

## 2013-10-14 DIAGNOSIS — I079 Rheumatic tricuspid valve disease, unspecified: Secondary | ICD-10-CM | POA: Insufficient documentation

## 2013-10-14 DIAGNOSIS — C50211 Malignant neoplasm of upper-inner quadrant of right female breast: Secondary | ICD-10-CM

## 2013-10-14 NOTE — Progress Notes (Signed)
Echocardiogram 2D Echocardiogram has been performed.  [name] 10/14/2013, 1:44 PM

## 2013-10-14 NOTE — Patient Instructions (Signed)
Follow up in 3 months with an ECHO 

## 2013-10-14 NOTE — Progress Notes (Signed)
Patient ID: [name], female   DOB: 07-03-1956, 57 y.o.   MRN: [id number]  Oncologist: Dr Darnelle Catalan  PCP: Dr Welton Flakes Surgeon: Dr Donell Beers   HPI:   [name] is a 57 y.o. woman with a history of HTN, rheumatic fever, stage IV breast cancer. Followed in the cardio-oncology program.   She was initially diagnosed with breast cancer 2003 and had recurrence in 2005. In 2003 she underwent right lumpectomy and sentinel lymph node sampling of 2 separate foci of invasive ductal carcinoma, mpT2 pN1 or stage IIB, both foci grade 3, both ER/PR positive, both Her 2 neu +, Mib-1 56. Then underwent right mastectomy 07/13/2004 and S/P left mastectomy 02/13/2006. Also had DVT RUE 2012- on coumadin for 1 year.   Between May and September 2005 received dose dense doxorubicin/cyclophosphamide x 4 given with Herceptin, followed by weekly docetaxel x 8, again given with Herceptin. Carboplatin/gemcitabine/Herceptin given for 2 cycles, February and March 2013. Navelbine given together with Herceptin between July 2012 and January 2013. 14) TDM-1 (Kadcyla) started 02/2012   ECHOs  05/08/12 EF 55-60%  09/10/2013 EF 55-65%  12/11/12 EF 55-60%  04/23/13 EF 55-60%  Lateral S' from main peak.  9/14 EF 55-60%, lateral s' 9.6 Global Strain - 21.8 10/14/13 EF 55-60% lateral S' 9.2 Strain -17.8  Labs  10/02/13 K 3.5 Creatinine 0.71   She returns for follow up.  Complains of fatigue. Denies any SOB, orthopnea, edema, or CP. She started on TDM I Kadcyla 02/2012.   SH: Lives with husband in [place]. Has 4 children.   ROS: All systems negative except as listed in HPI, PMH and Problem List.  Past Medical History  Diagnosis Date  . DVT (deep venous thrombosis) 10/07/2011  . Breast cancer   . Hypertension   . Rheumatic fever     Current Outpatient Prescriptions  Medication Sig Dispense Refill  . alprazolam (XANAX) 2 MG tablet Take 2 mg by mouth daily.       Marland Kitchen amLODipine (NORVASC) 10 MG tablet Take 10 mg by mouth  daily.        . Ascorbic Acid (VITAMIN C) 100 MG tablet Take 100 mg by mouth daily.        Marland Kitchen azithromycin (ZITHROMAX Z-PAK) 250 MG tablet Take as directed  6 each  0  . calcium-vitamin D (OSCAL WITH D) 500-200 MG-UNIT per tablet Take 1 tablet by mouth daily.        Marland Kitchen gabapentin (NEURONTIN) 100 MG capsule TAKE 1 CAPSULE BY MOUTH AT BEDTIME  30 capsule  1  . hydrochlorothiazide (HYDRODIURIL) 25 MG tablet TAKE 1 TABLET BY MOUTH 3 TIMES A WEEK ON MONDAY, WEDNESDAY, AND FRIDAY  30 tablet  1  . LORazepam (ATIVAN) 0.5 MG tablet Take 0.5 mg by mouth every 8 (eight) hours as needed.       . metaxalone (SKELAXIN) 800 MG tablet Take 800 mg by mouth 3 (three) times daily.       . Multiple Vitamin (MULTIVITAMIN) capsule Take 1 capsule by mouth daily.        . mupirocin ointment (BACTROBAN) 2 % Apply 1 application topically as needed.       Marland Kitchen oxyCODONE-acetaminophen (PERCOCET) 10-325 MG per tablet Take 1 tablet by mouth every 6 (six) hours as needed.       Marland Kitchen oxymorphone (OPANA) 10 MG tablet Take 10 mg by mouth 2 (two) times daily.       . promethazine (PHENERGAN) 25 MG tablet Take 1 tablet (25  mg total) by mouth every 6 (six) hours as needed for nausea.  30 tablet  0  . promethazine-codeine (PHENERGAN WITH CODEINE) 6.25-10 MG/5ML syrup po q 6-8 hrs prn cough  240 mL  0  . triamterene-hydrochlorothiazide (DYAZIDE) 37.5-25 MG per capsule TAKE 1 EACH (1 CAPSULE TOTAL) BY MOUTH EVERY MORNING.  30 capsule  1   No current facility-administered medications for this encounter.     PHYSICAL EXAM: There were no vitals filed for this visit.  General:  Well appearing. No resp difficulty HEENT: normal Neck: supple. JVP flat. Carotids 2+ bilaterally; no bruits. No lymphadenopathy or thryomegaly appreciated. Cor: PMI normal. Regular rate & rhythm. No rubs, gallops or murmurs. Lungs: clear Abdomen: soft, nontender, nondistended. No hepatosplenomegaly. No bruits or masses. Good bowel sounds. Extremities: no  cyanosis, clubbing, rash, edema Neuro: alert & orientedx3, cranial nerves grossly intact. Moves all 4 extremities w/o difficulty. Affect pleasant.  ASSESSMENT & PLAN:  1. Breast Cancer S/P  right lumpectomy and sentinel lymph node sampling 03/04/2002 for 2 separate foci of invasive ductal carcinoma, mpT2 pN1 or stage IIB, both foci grade 3, both estrogen and progesterone receptor positive, both HercepTest 3+, Mib-1 56%.    Dr Gala Romney discussed and reviewed ECHO. EF and doppler parameters stable. Follow in 3 months with an ECHO.   2. HTN - continue amlodipine 10 mg daily and HCTZ every Mon-Wed Fri.  Follow up in 3 months with ECHO.   CLEGG,AMY NP-C 10/14/2013 1:31 PM  Patient seen and examined with [name] Becket, NP. We discussed all aspects of the encounter. I agree with the assessment and plan as stated above. I reviewed echos personally and compared to previous. EF and Doppler parameters stable. No HF on exam. Continue Kadcyla.  Katya Rolston,MD 11:43 PM

## 2013-10-23 ENCOUNTER — Telehealth: Payer: Self-pay | Admitting: Oncology

## 2013-10-23 NOTE — Telephone Encounter (Signed)
, °

## 2013-10-24 ENCOUNTER — Other Ambulatory Visit: Payer: Commercial Managed Care - PPO

## 2013-10-24 ENCOUNTER — Ambulatory Visit: Payer: Commercial Managed Care - PPO

## 2013-11-11 ENCOUNTER — Other Ambulatory Visit: Payer: Self-pay | Admitting: Oncology

## 2013-11-11 DIAGNOSIS — C50211 Malignant neoplasm of upper-inner quadrant of right female breast: Secondary | ICD-10-CM

## 2013-11-13 ENCOUNTER — Other Ambulatory Visit: Payer: Self-pay | Admitting: Physician Assistant

## 2013-11-13 DIAGNOSIS — C50211 Malignant neoplasm of upper-inner quadrant of right female breast: Secondary | ICD-10-CM

## 2013-11-14 ENCOUNTER — Other Ambulatory Visit: Payer: Medicare Other

## 2013-11-14 ENCOUNTER — Other Ambulatory Visit: Payer: Self-pay | Admitting: Physician Assistant

## 2013-11-14 ENCOUNTER — Ambulatory Visit: Payer: Medicare Other | Admitting: Physician Assistant

## 2013-11-14 ENCOUNTER — Ambulatory Visit: Payer: Medicare Other

## 2013-11-15 ENCOUNTER — Telehealth: Payer: Self-pay | Admitting: Oncology

## 2013-11-15 NOTE — Telephone Encounter (Signed)
added f/u w/GM for 1/29 per 1/8 pof. s/w pt re appt for 1/29 lb/fu/inf @ 10am.

## 2013-12-05 ENCOUNTER — Telehealth: Payer: Self-pay | Admitting: Oncology

## 2013-12-05 ENCOUNTER — Other Ambulatory Visit (HOSPITAL_BASED_OUTPATIENT_CLINIC_OR_DEPARTMENT_OTHER): Payer: Commercial Managed Care - PPO

## 2013-12-05 ENCOUNTER — Encounter (INDEPENDENT_AMBULATORY_CARE_PROVIDER_SITE_OTHER): Payer: Self-pay

## 2013-12-05 ENCOUNTER — Telehealth: Payer: Self-pay | Admitting: *Deleted

## 2013-12-05 ENCOUNTER — Ambulatory Visit (HOSPITAL_BASED_OUTPATIENT_CLINIC_OR_DEPARTMENT_OTHER): Payer: Commercial Managed Care - PPO | Admitting: Oncology

## 2013-12-05 ENCOUNTER — Other Ambulatory Visit: Payer: Medicare Other

## 2013-12-05 ENCOUNTER — Ambulatory Visit: Payer: Commercial Managed Care - PPO

## 2013-12-05 VITALS — BP 90/60 | HR 89 | Temp 98.3°F | Resp 18 | Ht 66.0 in | Wt 189.5 lb

## 2013-12-05 DIAGNOSIS — I428 Other cardiomyopathies: Secondary | ICD-10-CM

## 2013-12-05 DIAGNOSIS — C770 Secondary and unspecified malignant neoplasm of lymph nodes of head, face and neck: Secondary | ICD-10-CM

## 2013-12-05 DIAGNOSIS — C50219 Malignant neoplasm of upper-inner quadrant of unspecified female breast: Secondary | ICD-10-CM

## 2013-12-05 DIAGNOSIS — C773 Secondary and unspecified malignant neoplasm of axilla and upper limb lymph nodes: Secondary | ICD-10-CM

## 2013-12-05 DIAGNOSIS — C50211 Malignant neoplasm of upper-inner quadrant of right female breast: Secondary | ICD-10-CM

## 2013-12-05 DIAGNOSIS — Z86718 Personal history of other venous thrombosis and embolism: Secondary | ICD-10-CM

## 2013-12-05 DIAGNOSIS — I890 Lymphedema, not elsewhere classified: Secondary | ICD-10-CM

## 2013-12-05 DIAGNOSIS — C771 Secondary and unspecified malignant neoplasm of intrathoracic lymph nodes: Secondary | ICD-10-CM

## 2013-12-05 DIAGNOSIS — I82409 Acute embolism and thrombosis of unspecified deep veins of unspecified lower extremity: Secondary | ICD-10-CM

## 2013-12-05 LAB — CBC WITH DIFFERENTIAL/PLATELET
BASO%: 0.9 % (ref 0.0–2.0)
Basophils Absolute: 0 10*3/uL (ref 0.0–0.1)
EOS%: 3.4 % (ref 0.0–7.0)
Eosinophils Absolute: 0.2 10*3/uL (ref 0.0–0.5)
HCT: 45.7 % (ref 34.8–46.6)
HGB: 15.3 g/dL (ref 11.6–15.9)
LYMPH%: 31.8 % (ref 14.0–49.7)
MCH: 28.7 pg (ref 25.1–34.0)
MCHC: 33.4 g/dL (ref 31.5–36.0)
MCV: 85.9 fL (ref 79.5–101.0)
MONO#: 0.4 10*3/uL (ref 0.1–0.9)
MONO%: 8.8 % (ref 0.0–14.0)
NEUT#: 2.8 10*3/uL (ref 1.5–6.5)
NEUT%: 55.1 % (ref 38.4–76.8)
Platelets: 85 10*3/uL — ABNORMAL LOW (ref 145–400)
RBC: 5.32 10*6/uL (ref 3.70–5.45)
RDW: 16.9 % — ABNORMAL HIGH (ref 11.2–14.5)
WBC: 5 10*3/uL (ref 3.9–10.3)
lymph#: 1.6 10*3/uL (ref 0.9–3.3)

## 2013-12-05 LAB — COMPREHENSIVE METABOLIC PANEL (CC13)
ALT: 30 U/L (ref 0–55)
AST: 57 U/L — ABNORMAL HIGH (ref 5–34)
Albumin: 4 g/dL (ref 3.5–5.0)
Alkaline Phosphatase: 202 U/L — ABNORMAL HIGH (ref 40–150)
Anion Gap: 12 mEq/L — ABNORMAL HIGH (ref 3–11)
BUN: 9.9 mg/dL (ref 7.0–26.0)
CO2: 25 mEq/L (ref 22–29)
Calcium: 10.3 mg/dL (ref 8.4–10.4)
Chloride: 103 mEq/L (ref 98–109)
Creatinine: 0.8 mg/dL (ref 0.6–1.1)
Glucose: 135 mg/dl (ref 70–140)
Potassium: 3.7 mEq/L (ref 3.5–5.1)
Sodium: 139 mEq/L (ref 136–145)
Total Bilirubin: 1.09 mg/dL (ref 0.20–1.20)
Total Protein: 8.3 g/dL (ref 6.4–8.3)

## 2013-12-05 MED ORDER — INFLUENZA VAC SPLIT QUAD 0.5 ML IM SUSP
0.5000 mL | Freq: Once | INTRAMUSCULAR | Status: DC
  Filled 2013-12-05: qty 0.5

## 2013-12-05 NOTE — Telephone Encounter (Signed)
gv pt new appt schedule for feb thru april. central will call re scan appts - pt aware.

## 2013-12-05 NOTE — Telephone Encounter (Signed)
Patient's pltc = 85.  Called Dr. Jana Hakim.  Verbal order received and read back "Patient will not be treated today and will reschedule for next Tuesday".

## 2013-12-05 NOTE — Progress Notes (Signed)
[place]  [phone]) [phone] Fax:(336) [phone]  OFFICE PROGRESS NOTE   ID: [name]   DOB: [birth year]  MR#: [id number]  TIR#:[id number]   PCP: [name], MD [name], [name]/[name], [name] RAD QPY:PPJKDTO [name], [name] OTHER:  [name] Bickers, MD  CHIEF COMPLAINT:  Stage IV Breast Cancer   HISTORY OF PRESENT ILLNESS: The patient originally presented in May 2003 when she noticed a lump in the upper inner quadrant of her right breast in September 2003.  She sought attention and had a mammogram which showed an obvious carcinoma in the upper inner quadrant of the right breast, approximately 2 cm.  There were some enlarged lymph nodes in the axilla and an FNA done showed those consistent with malignant cells, most likely an invasive ductal carcinoma.  At that point she was unsure of what to do and was referred by Dr. [name] for a discussion of her treatment options.  By biopsy, it was ER/PR negative and HER-2 was 3+.  DNA index was 1.42.  We reiterated that it was most important to have her disease surgically addressed at that point and had recommended lumpectomy and axillary nodes to be addressed with which she followed through and on 04-03-02, Dr. [name] performed a right partial mastectomy and a right axillary lymph node dissection.  Final pathology revealed a 2.4 cm. high grade, Grade III invasive ductal carcinoma with an adjacent .8 cm. also high grade invasive ductal carcinoma which was felt to represent an intramammary metastasis rather than a second primary.  A smaller mass was just medial to the larger mass.  The smaller mass was associated with high grade DCIS component.  There was no definite lymphovascular invasion identified.  However, one of fourteen axillary lymph nodes did contain a 1.5 cm. metastatic deposit.  Postoperatively she did very well.  We reiterated the fact that she did need adjuvant chemotherapy, however, she refused and  decided that she would pursue radiation.  She received radiation and completed that on 08-14-02. Her subsequent history is as detailed below.   INTERVAL HISTORY: [name] returns today for followup of [name] stage IV breast cancer. She continues to receive Kadcyla every 3 weeks (started April 2013)  and is due for her next dose today. Her most recent echocardiogram in December 2014 showed a well preserved ejection fraction  REVIEW OF SYSTEMS: However [name] really does not want to be treated today. "I didn't come to get treated, I came to talk to you". She just does not feel good with these treatments. They make her feel tired. Her appetite comes and goes. She sleeps poorly. She has arthritis pains "all over" which can be crampy or achy. They are not more frequent or intense than prior. She has a runny nose. Her dentures don't fit well. She keeps a dry cough. She can be short of breath when walking up stairs but not otherwise. She bruises easily. She feels forgetful, anxious and depressed. She has pain under the arm particularly on the right. A detailed review of systems today was otherwise noncontributory  PAST MEDICAL HISTORY: Past Medical History  Diagnosis Date  . DVT (deep venous thrombosis) 10/07/2011  . Breast cancer   . Hypertension   . Rheumatic fever     PAST SURGICAL HISTORY: Past Surgical History  Procedure Laterality Date  . Mastectomy      bilateral  . Breast surgery    . Port a cath revision      FAMILY HISTORY  Family History  Problem Relation Age of Onset  . Pancreatic cancer Mother   . Kidney cancer Sister 85  . Breast cancer Sister 1  . Breast cancer Other 64  The patient's father died in his 89Q  from complications of alcohol abuse. The patient's mother died in her 74s from pancreatic cancer. The patient has 6 brothers, 2 sisters. Both her sisters have had breast cancer, both diagnosed after the age of 32. The patient has not had genetic testing so  far.   GYNECOLOGIC HISTORY: Menarche age 25; first live birth age 67. She is GXP4. She is not sure when she stopped having periods. She never used hormone replacement.   SOCIAL HISTORY: The patient has worked in the past as a Psychologist, counselling. At home in addition to the patient are her husband [name], originally from [place], who works as a Administrator.  Also living in the home is her son [name] and her daughter [name] (she is studying to be a Marine scientist).  The other 2 children are [name], who has a college degree in psychology and works as a Probation officer; and [name], who lives in [place] and works as a Higher education careers adviser.   ADVANCED DIRECTIVES: Not in place  HEALTH MAINTENANCE: History  Substance Use Topics  . Smoking status: Light Tobacco Smoker  . Smokeless tobacco: Never Used  . Alcohol Use: Yes     Comment: Rarely     Colonoscopy:  Not on file  PAP:  Not on file  Bone density:  Not on file  Lipid panel:  Not on file    Allergies  Allergen Reactions  . Tramadol Nausea Only  . Hydrocodone-Acetaminophen Itching and Rash    Current Outpatient Prescriptions  Medication Sig Dispense Refill  . alprazolam (XANAX) 2 MG tablet Take 2 mg by mouth daily.       Marland Kitchen amLODipine (NORVASC) 10 MG tablet Take 10 mg by mouth. MON, WED, and FRI      . Ascorbic Acid (VITAMIN C) 100 MG tablet Take 100 mg by mouth daily.        . calcium-vitamin D (OSCAL WITH D) 500-200 MG-UNIT per tablet Take 1 tablet by mouth daily.        Marland Kitchen gabapentin (NEURONTIN) 100 MG capsule TAKE 1 CAPSULE BY MOUTH AT BEDTIME  30 capsule  1  . hydrochlorothiazide (HYDRODIURIL) 25 MG tablet TAKE 1 TABLET BY MOUTH 3 TIMES A WEEK ON MONDAY, WEDNESDAY, AND FRIDAY  30 tablet  1  . metaxalone (SKELAXIN) 800 MG tablet Take 800 mg by mouth 3 (three) times daily.       . Multiple Vitamin (MULTIVITAMIN) capsule Take 1 capsule by mouth daily.        . mupirocin ointment (BACTROBAN) 2 % Apply 1 application topically as needed.       Marland Kitchen  oxyCODONE-acetaminophen (PERCOCET) 10-325 MG per tablet Take 1 tablet by mouth every 6 (six) hours as needed.       Marland Kitchen oxymorphone (OPANA) 10 MG tablet Take 10 mg by mouth 2 (two) times daily.       . promethazine (PHENERGAN) 25 MG tablet Take 1 tablet (25 mg total) by mouth every 6 (six) hours as needed for nausea.  30 tablet  0  . triamterene-hydrochlorothiazide (DYAZIDE) 37.5-25 MG per capsule TAKE 1 EACH (1 CAPSULE TOTAL) BY MOUTH EVERY MORNING.  30 capsule  1   No current facility-administered medications for this visit.    OBJECTIVE: Middle-aged [place] American woman who appears stated  age 58 Vitals:   12/05/13 1027  BP: 90/60  Pulse: 89  Temp: 98.3 F (36.8 C)  Resp: 18     Body mass index is 30.6 kg/(m^2).    ECOG FS: 1 Filed Weights   12/05/13 1027  Weight: 189 lb 8 oz (85.957 kg)   Sclerae unicteric, pupils equal and round Oropharynx clear and moist-- no thrush or other lesions No cervical or supraclavicular adenopathy Lungs no rales or rhonchi Heart regular rate and rhythm Abd soft, obese, nontender, positive bowel sounds MSK no focal spinal tenderness, chronic grade 1 right upper extremity lymphedema, with compression sleeve in place Neuro: nonfocal, well oriented, positive affect Breasts: Status post bilateral mastectomies. There is no evidence of local recurrence. Both axillae are benign.   LAB RESULTS: Lab Results  Component Value Date   WBC 5.0 12/05/2013   NEUTROABS 2.8 12/05/2013   HGB 15.3 12/05/2013   HCT 45.7 12/05/2013   MCV 85.9 12/05/2013   PLT 85* 12/05/2013      Chemistry      Component Value Date/Time   NA 139 12/05/2013 0958   NA 138 10/02/2013 1640   K 3.7 12/05/2013 0958   K 3.5 10/02/2013 1640   CL 100 10/02/2013 1640   CL 101 03/07/2013 1411   CO2 25 12/05/2013 0958   CO2 30 10/02/2013 1640   BUN 9.9 12/05/2013 0958   BUN 9 10/02/2013 1640   CREATININE 0.8 12/05/2013 0958   CREATININE 0.71 10/02/2013 1640      Component Value Date/Time    CALCIUM 10.3 12/05/2013 0958   CALCIUM 10.0 10/02/2013 1640   ALKPHOS 202* 12/05/2013 0958   ALKPHOS 157* 10/02/2013 1640   AST 57* 12/05/2013 0958   AST 64* 10/02/2013 1640   ALT 30 12/05/2013 0958   ALT 28 10/02/2013 1640   BILITOT 1.09 12/05/2013 0958   BILITOT 0.8 10/02/2013 1640       STUDIES: *[address] [place], [place] [address] [phone]  ------------------------------------------------------------ Transthoracic Echocardiography  Patient: [name], [name] MR #: [id number] Study Date: 10/14/2013 Gender: F Age: 8 Height: 170.2cm Weight: 83kg BSA: 1.83m2 Pt. Status: Room:  [place] [name] REFERRING [place] Jimmy Reel cc:  ------------------------------------------------------------ LV EF: 55%  ------------------------------------------------------------ Indications: V58.11 Chemotherapy Evaluation.  ------------------------------------------------------------ Study Conclusions  - Left ventricle: The cavity size was normal. Wall thickness was increased in a pattern of mild LVH. The estimated ejection fraction was 55%. Wall motion was normal; there were no regional wall motion abnormalities. Features are consistent with a pseudonormal left ventricular filling pattern, with concomitant abnormal relaxation and increased filling pressure (grade 2 diastolic dysfunction). Lateral s' 8.8 cm/sec. Global longitudinal strain -20.7%.   ASSESSMENT:  58y.o. [place] woman with stage IV breast cancer manifested chiefly by loco-regional nodal disease (neck, chest), without lung, liver, bone, or brain involvement.  (1) Status post right lumpectomy and sentinel lymph node sampling 03/04/2002 for 2 separate foci of invasive ductal carcinoma, mpT2 pN1 or stage IIB, both foci grade 3, both estrogen and  progesterone receptor positive, both HercepTest 3+, Mib-1 56%  (2) Reexcision for margins 05/27/2002 showed no residual cancer in the breast.  (3) The patient refused adjuvant systemic therapy.  (4) Adjuvant radiation treatment completed 08/14/2002.  (5) recurrence in the right breast in 02/2004 showing a morphologically different tumor, again grade 3, again estrogen and progesterone receptor negative, with an MIB-1 of  14% and Herceptest 3+.  (5) Between 03/2004 and 07/2004 she received dose dense Doxorubicin/Cyclophosphamide x 4 given with trastuzumab, followed by weekly Docetaxel x 8, again given with trastuzumab.  (6) Right mastectomy 07/13/2004 showed scattered microscopic foci of residual disease over an area  greater than 5 cm. Margins were negative.  (7) Postoperative Docetaxel continued until 09/2004.  (8) Trastuzumab (Herceptin) given 08/2004 through 01/2012 with some brief interruptions.  (9) Isolated right cervical nodal recurrence 10/2005, treated with radiation to the right supraclavicular area (total 41.5 gray) completed 12/29/2005.  (10) [name] given together with Herceptin  11/2005 through 03/2006.  (9) Left mastectomy 02/13/2006 for ductal carcinoma in situ, grade 2, estrogen and progesterone receptor negative, with negative margins; 0 of 3 lymph nodes involved  (10) treated with Lapatinib and Capecitabine before 10/2009, for an unclear duration and with unclear results (cannot locate data on chart review).  (11) Status post right supraclavicular lymph node biopsy 09/2010 again positive for an invasive ductal carcinoma, estrogen and progesterone receptor negative, HER-2 positive by CISH with a ratio 4.25.  (12) [name] given together with Herceptin between 05/2011 and 11/2011.  (13) Carboplatin/ Gemcitabine/ Herceptin given for 2 cycles, in 12/2011 and 01/2012.  (14) TDM-1 (Kadcyla) started 02/2012. Most recent echo on December 2014 showed a well preserved  ejection fraction.  (15) Deep vein thrombosis of the right upper extremity documented 04/20/2011.  She completed anticoagulant therapy with Coumadin on 03/25/2013.  (16) Chronic right upper extremity lymphedema.  (17) Right chest port-a-cath removal due to infection on 01/28/2013. Left chest Port-A-Cath placed on 04/08/2013.   PLAN: Fatemah is "not feeling well" from the treatments and would like to have a "treatment break.  After much negotiation today, taking well over 40 minutes, we decided we would continue the TDM 1 through April of this year, which will make it to 4 years. In April she will be completely restaged with CT scans and PET scan as well as a brain MRI. If there is no evidence of active disease we will consider perhaps backing off to double anti-HER-2 treatment with trastuzumab plus pertuzumab, which I think she would tolerate a little bit better.  Otherwise I am delighted that she is doing so well of course she will need a repeat echocardiogram in March.  Marrie has a good understanding of this plan. She did not want to get treated today because she likes to feel good on the weekends. Accordingly we are moving her treatments to Tuesdays. She knows to call for any problems that may develop before next visit here.  Chauncey Cruel, MD  12/05/2013 10:51 AM

## 2013-12-10 ENCOUNTER — Other Ambulatory Visit: Payer: Commercial Managed Care - PPO

## 2013-12-10 ENCOUNTER — Ambulatory Visit: Payer: Commercial Managed Care - PPO

## 2013-12-15 ENCOUNTER — Other Ambulatory Visit: Payer: Self-pay | Admitting: Oncology

## 2013-12-15 DIAGNOSIS — I428 Other cardiomyopathies: Secondary | ICD-10-CM

## 2013-12-26 ENCOUNTER — Other Ambulatory Visit: Payer: Medicare Other

## 2013-12-29 IMAGING — CR DG CHEST 2V
2 series · 2 of 2 positions shown · non-contrast
Comparison: None.

CLINICAL DATA: History of carcinoma of the breast with increasing
shortness of breath

EXAM:
CHEST  2 VIEW

[w chest pa]
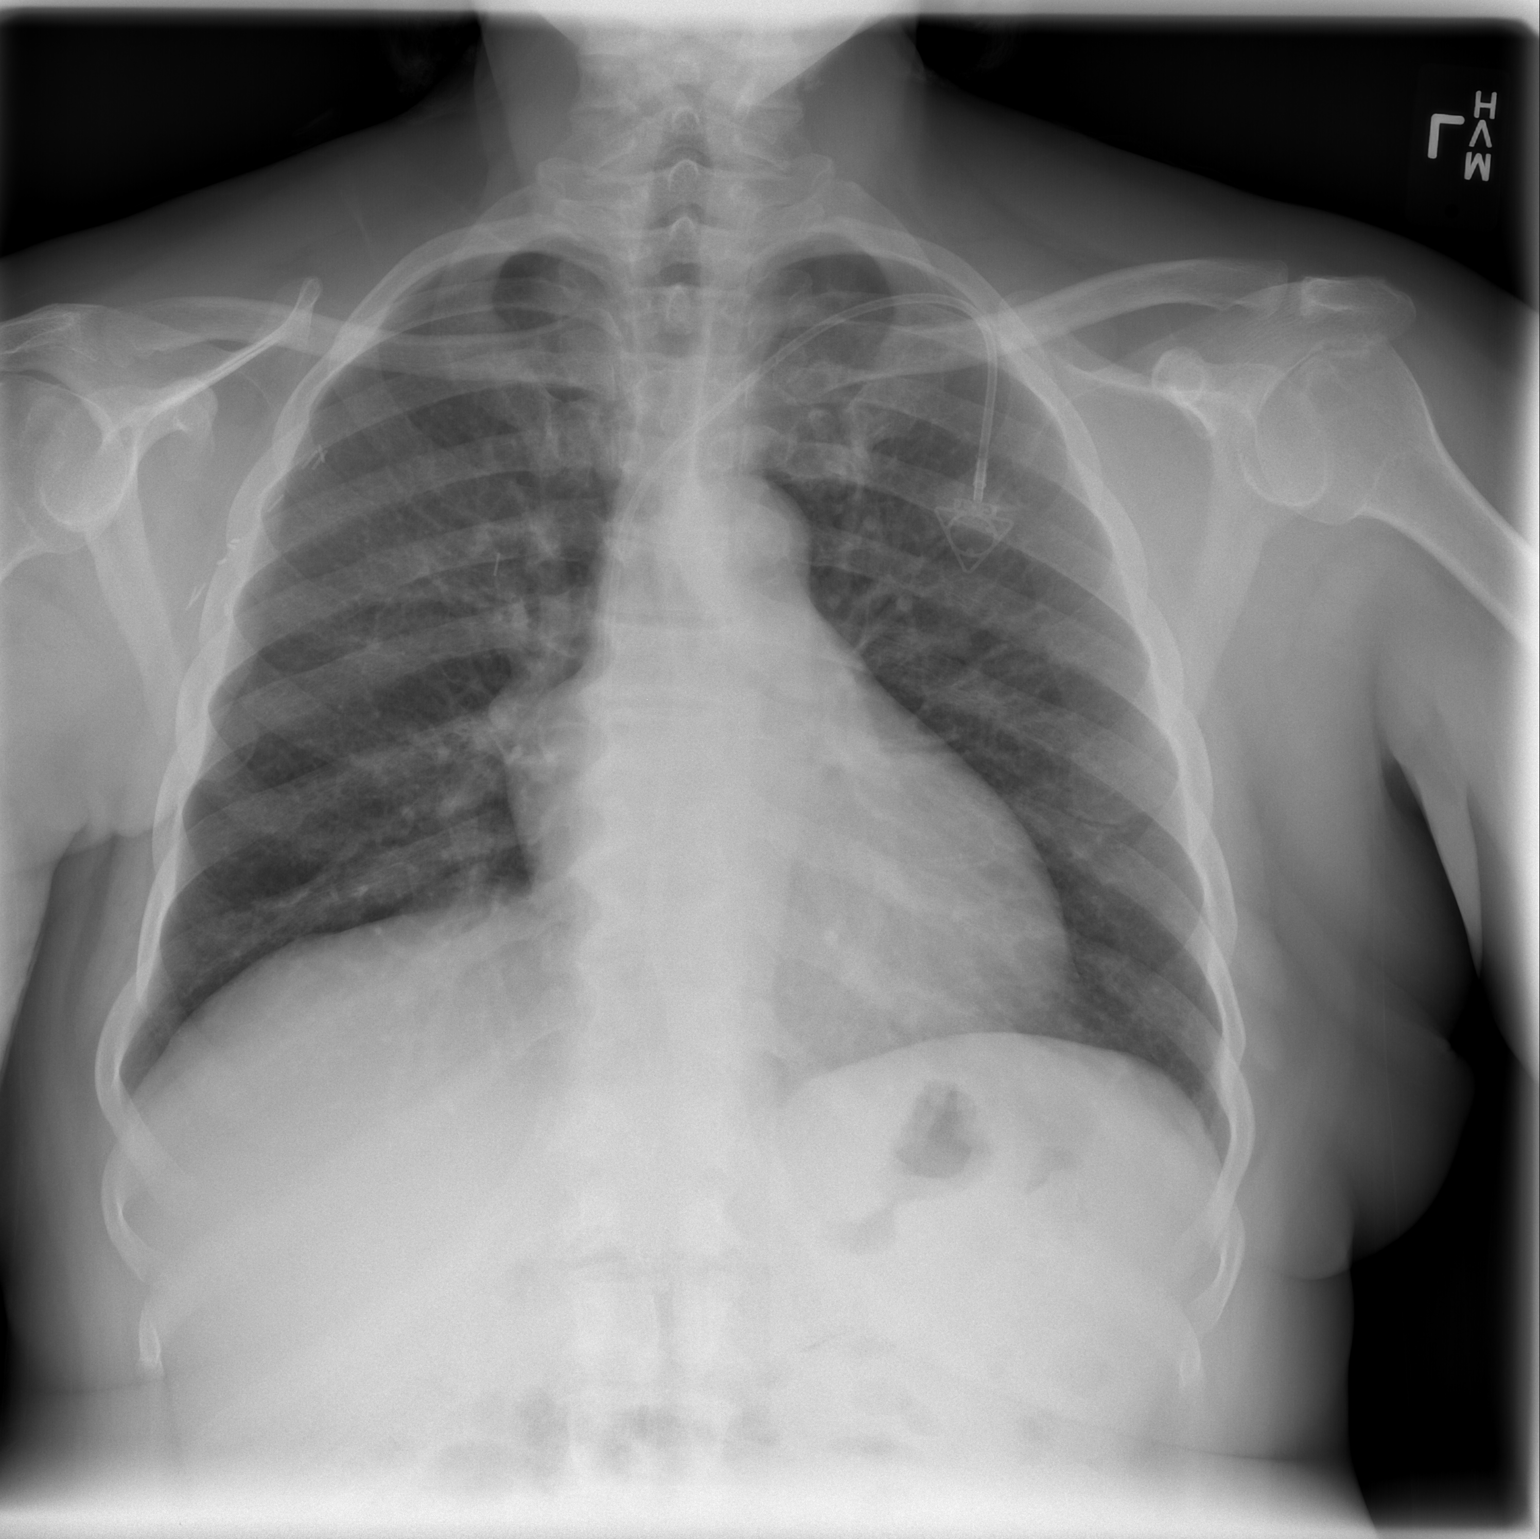

[w chest lat]
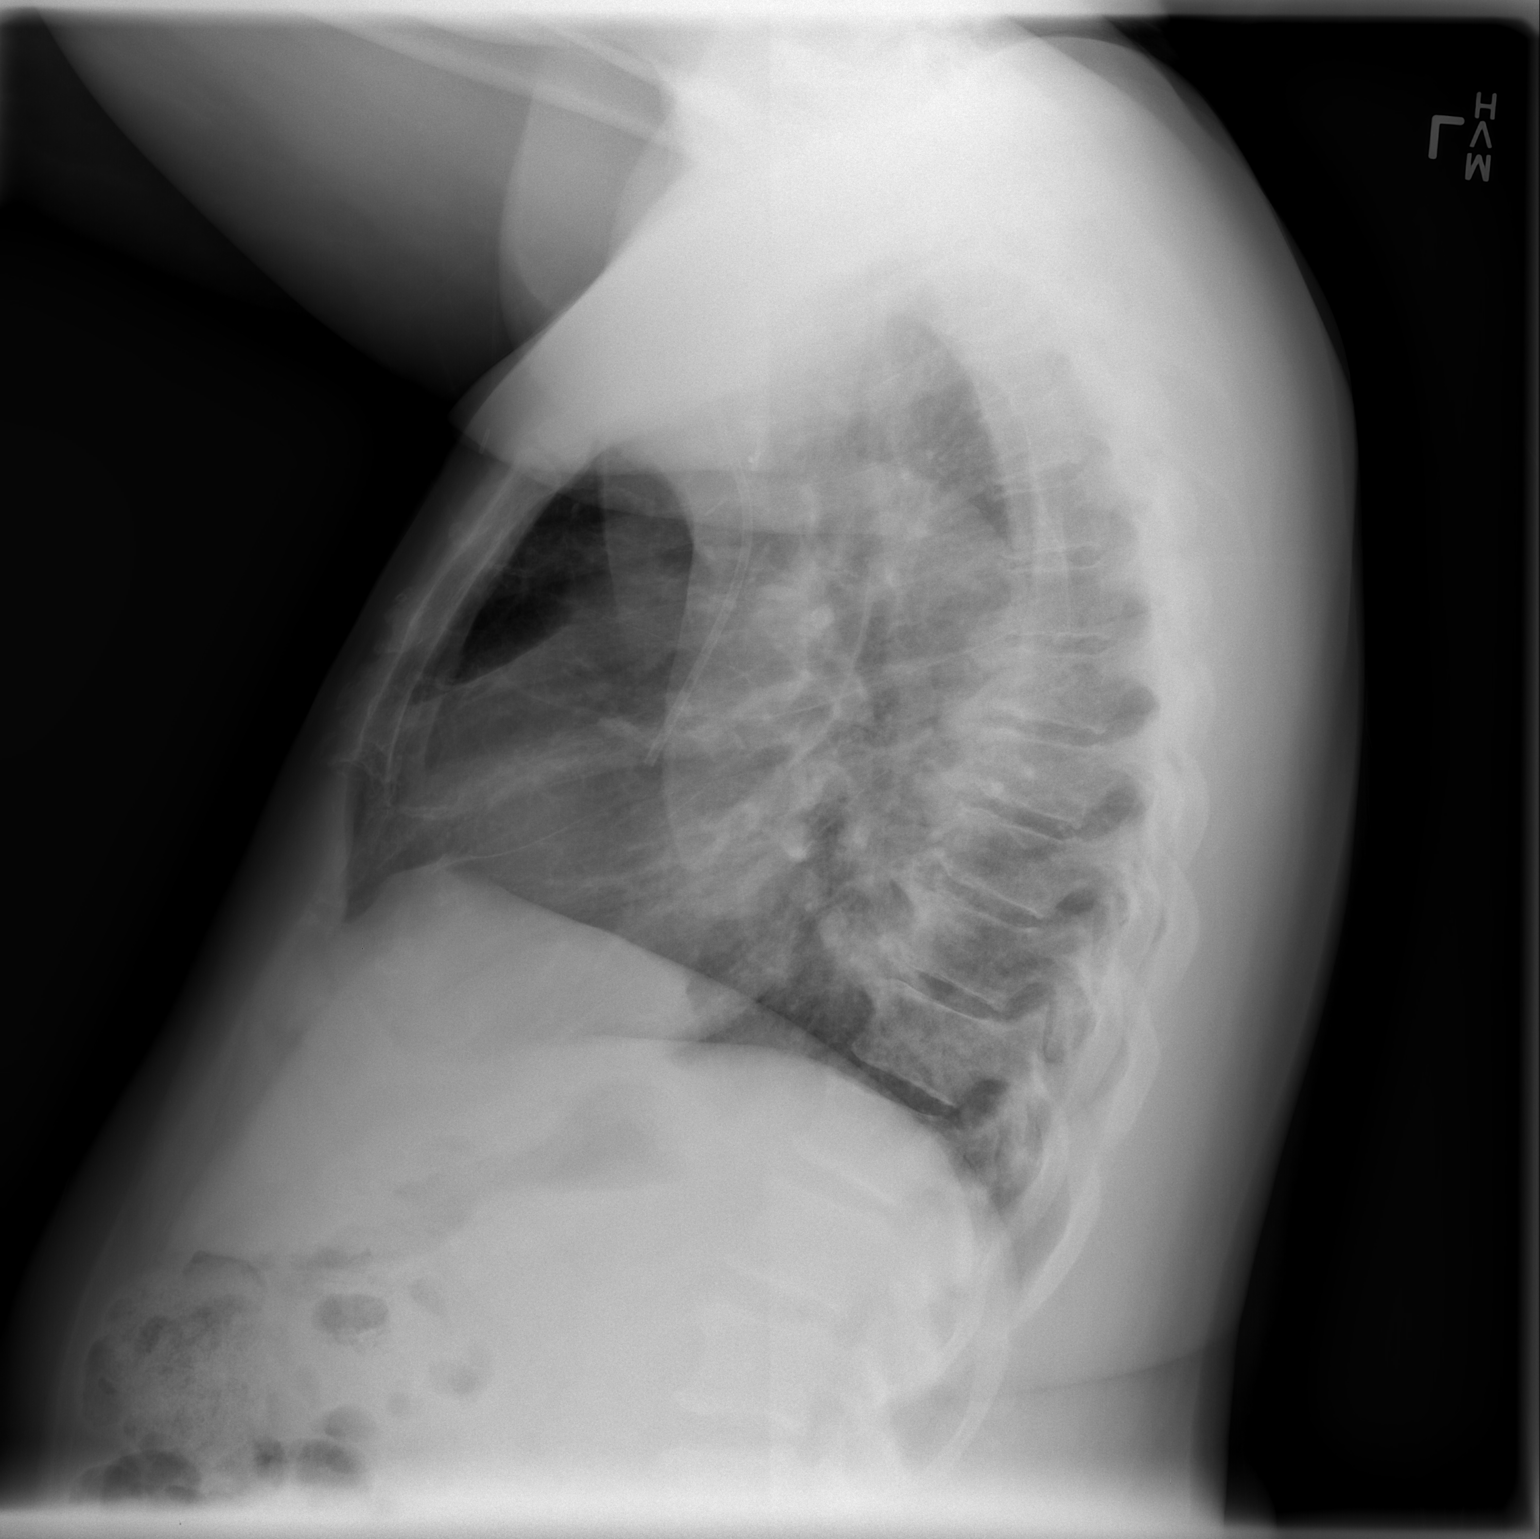

[2 of 2 positions shown; findings below may reference images not displayed]

FINDINGS: The cardiac shadow is within normal limits. A left-sided chest port
is seen with catheter tip at the cavoatrial junction. The lungs are
well aerated bilaterally without focal infiltrate or sizable
effusion. Degenerative change of the thoracic spine is seen.
Postsurgical changes are noted on the right consistent with the
patient's given clinical history.
IMPRESSION: No acute abnormality is noted.

## 2013-12-31 ENCOUNTER — Other Ambulatory Visit: Payer: Commercial Managed Care - PPO

## 2013-12-31 ENCOUNTER — Ambulatory Visit: Payer: Commercial Managed Care - PPO

## 2014-01-13 ENCOUNTER — Other Ambulatory Visit: Payer: Self-pay | Admitting: Oncology

## 2014-01-20 ENCOUNTER — Other Ambulatory Visit: Payer: Self-pay | Admitting: *Deleted

## 2014-01-20 ENCOUNTER — Telehealth: Payer: Self-pay | Admitting: *Deleted

## 2014-01-20 NOTE — Progress Notes (Signed)
Call transferred from scheduling per pt's request to cancel chemo appointments for 01/21/2014.  Per discussion- Cambell states she " feels better off of the chemo and have even gained a little weight ".  This RN discussed with pt above including reason for chemotherapy is to control cancer growth.  Per discussion Tarhonda's decision is to not do the chemotherapy at this time but keep scheduled restaging scans and MD appt in April.  This RN informed pt above is her choice as well as our goal is to take good care of her. This RN thanked pt for her communication regarding not wanting to do chemotherapy and reason why. Pt aware RN will review above with MD for any additional recommendations.  POF placed to cancel appointments on 01/21/2014.

## 2014-01-21 ENCOUNTER — Other Ambulatory Visit: Payer: Commercial Managed Care - PPO

## 2014-01-21 ENCOUNTER — Ambulatory Visit: Payer: Commercial Managed Care - PPO

## 2014-02-03 ENCOUNTER — Telehealth: Payer: Self-pay | Admitting: *Deleted

## 2014-02-03 NOTE — Telephone Encounter (Signed)
Patient called verifying appts for PET and MD followup. Patient states she has needed a break from chemo. Patient already has dates and times.

## 2014-02-11 ENCOUNTER — Other Ambulatory Visit: Payer: Commercial Managed Care - PPO

## 2014-02-11 ENCOUNTER — Ambulatory Visit: Payer: Commercial Managed Care - PPO

## 2014-02-13 ENCOUNTER — Telehealth: Payer: Self-pay | Admitting: Oncology

## 2014-02-13 NOTE — Telephone Encounter (Signed)
returned pt's call to confirm appts. per pt request mailed april schedule including lb/fu and scans.

## 2014-02-20 ENCOUNTER — Encounter (HOSPITAL_COMMUNITY): Payer: Self-pay | Admitting: Cardiology

## 2014-02-25 ENCOUNTER — Ambulatory Visit (HOSPITAL_COMMUNITY)
Admission: RE | Admit: 2014-02-25 | Discharge: 2014-02-25 | Disposition: A | Discharge status: Home / Self Care | Payer: Commercial Managed Care - PPO | Source: Ambulatory Visit | Attending: Oncology | Admitting: Oncology

## 2014-02-25 ENCOUNTER — Encounter (HOSPITAL_COMMUNITY): Payer: Self-pay

## 2014-02-25 ENCOUNTER — Ambulatory Visit (HOSPITAL_COMMUNITY): Payer: Commercial Managed Care - PPO

## 2014-02-25 ENCOUNTER — Encounter (HOSPITAL_COMMUNITY)
Admission: RE | Admit: 2014-02-25 | Discharge: 2014-02-25 | Disposition: A | Discharge status: Home / Self Care | Payer: Commercial Managed Care - PPO | Source: Ambulatory Visit | Attending: Oncology | Admitting: Oncology

## 2014-02-25 DIAGNOSIS — K838 Other specified diseases of biliary tract: Secondary | ICD-10-CM | POA: Insufficient documentation

## 2014-02-25 DIAGNOSIS — C50211 Malignant neoplasm of upper-inner quadrant of right female breast: Secondary | ICD-10-CM

## 2014-02-25 DIAGNOSIS — I890 Lymphedema, not elsewhere classified: Secondary | ICD-10-CM

## 2014-02-25 DIAGNOSIS — C50919 Malignant neoplasm of unspecified site of unspecified female breast: Secondary | ICD-10-CM | POA: Insufficient documentation

## 2014-02-25 DIAGNOSIS — I82409 Acute embolism and thrombosis of unspecified deep veins of unspecified lower extremity: Secondary | ICD-10-CM

## 2014-02-25 DIAGNOSIS — I428 Other cardiomyopathies: Secondary | ICD-10-CM

## 2014-02-25 DIAGNOSIS — Z9010 Acquired absence of unspecified breast and nipple: Secondary | ICD-10-CM | POA: Insufficient documentation

## 2014-02-25 DIAGNOSIS — K7689 Other specified diseases of liver: Secondary | ICD-10-CM | POA: Insufficient documentation

## 2014-02-25 LAB — GLUCOSE, CAPILLARY: Glucose-Capillary: 95 mg/dL (ref 70–99)

## 2014-02-25 MED ORDER — FLUDEOXYGLUCOSE F - 18 (FDG) INJECTION
9.3000 | Freq: Once | INTRAVENOUS | Status: AC | PRN
  Administered 2014-02-25: 9.3 via INTRAVENOUS

## 2014-02-25 MED ORDER — IOHEXOL 300 MG/ML  SOLN
80.0000 mL | Freq: Once | INTRAMUSCULAR | Status: AC | PRN
  Administered 2014-02-25: 80 mL via INTRAVENOUS

## 2014-02-26 ENCOUNTER — Ambulatory Visit (HOSPITAL_COMMUNITY): Payer: Commercial Managed Care - PPO

## 2014-02-27 ENCOUNTER — Telehealth: Payer: Self-pay

## 2014-02-27 NOTE — Telephone Encounter (Signed)
LMOVM returning call - pt scans for CT chest and head and PET completed 02/25/14.  Call clinic with questions.

## 2014-03-04 ENCOUNTER — Ambulatory Visit: Payer: Commercial Managed Care - PPO

## 2014-03-04 ENCOUNTER — Ambulatory Visit (HOSPITAL_BASED_OUTPATIENT_CLINIC_OR_DEPARTMENT_OTHER): Payer: Commercial Managed Care - PPO | Admitting: Oncology

## 2014-03-04 ENCOUNTER — Telehealth: Payer: Self-pay | Admitting: Oncology

## 2014-03-04 ENCOUNTER — Ambulatory Visit (HOSPITAL_BASED_OUTPATIENT_CLINIC_OR_DEPARTMENT_OTHER): Payer: Commercial Managed Care - PPO

## 2014-03-04 VITALS — BP 138/92 | HR 98 | Temp 98.1°F | Resp 20 | Ht 66.0 in | Wt 184.4 lb

## 2014-03-04 DIAGNOSIS — I890 Lymphedema, not elsewhere classified: Secondary | ICD-10-CM

## 2014-03-04 DIAGNOSIS — C50219 Malignant neoplasm of upper-inner quadrant of unspecified female breast: Secondary | ICD-10-CM

## 2014-03-04 DIAGNOSIS — C778 Secondary and unspecified malignant neoplasm of lymph nodes of multiple regions: Secondary | ICD-10-CM

## 2014-03-04 DIAGNOSIS — Z171 Estrogen receptor negative status [ER-]: Secondary | ICD-10-CM

## 2014-03-04 DIAGNOSIS — C50211 Malignant neoplasm of upper-inner quadrant of right female breast: Secondary | ICD-10-CM

## 2014-03-04 DIAGNOSIS — I82409 Acute embolism and thrombosis of unspecified deep veins of unspecified lower extremity: Secondary | ICD-10-CM

## 2014-03-04 LAB — COMPREHENSIVE METABOLIC PANEL (CC13)
ALT: 41 U/L (ref 0–55)
AST: 62 U/L — ABNORMAL HIGH (ref 5–34)
Albumin: 4.1 g/dL (ref 3.5–5.0)
Alkaline Phosphatase: 242 U/L — ABNORMAL HIGH (ref 40–150)
Anion Gap: 14 mEq/L — ABNORMAL HIGH (ref 3–11)
BUN: 10.3 mg/dL (ref 7.0–26.0)
CO2: 26 mEq/L (ref 22–29)
Calcium: 10.8 mg/dL — ABNORMAL HIGH (ref 8.4–10.4)
Chloride: 100 mEq/L (ref 98–109)
Creatinine: 0.9 mg/dL (ref 0.6–1.1)
Glucose: 106 mg/dl (ref 70–140)
Potassium: 3.7 mEq/L (ref 3.5–5.1)
Sodium: 141 mEq/L (ref 136–145)
Total Bilirubin: 0.71 mg/dL (ref 0.20–1.20)
Total Protein: 8.4 g/dL — ABNORMAL HIGH (ref 6.4–8.3)

## 2014-03-04 LAB — CBC WITH DIFFERENTIAL/PLATELET
BASO%: 0.9 % (ref 0.0–2.0)
Basophils Absolute: 0.1 10*3/uL (ref 0.0–0.1)
EOS%: 1.6 % (ref 0.0–7.0)
Eosinophils Absolute: 0.1 10*3/uL (ref 0.0–0.5)
HCT: 45.8 % (ref 34.8–46.6)
HGB: 15.1 g/dL (ref 11.6–15.9)
LYMPH%: 31.9 % (ref 14.0–49.7)
MCH: 28.9 pg (ref 25.1–34.0)
MCHC: 33 g/dL (ref 31.5–36.0)
MCV: 87.4 fL (ref 79.5–101.0)
MONO#: 0.5 10*3/uL (ref 0.1–0.9)
MONO%: 8.5 % (ref 0.0–14.0)
NEUT#: 3.5 10*3/uL (ref 1.5–6.5)
NEUT%: 57.1 % (ref 38.4–76.8)
Platelets: 135 10*3/uL — ABNORMAL LOW (ref 145–400)
RBC: 5.24 10*6/uL (ref 3.70–5.45)
RDW: 14.2 % (ref 11.2–14.5)
WBC: 6.2 10*3/uL (ref 3.9–10.3)
lymph#: 2 10*3/uL (ref 0.9–3.3)

## 2014-03-04 MED ORDER — HEPARIN SOD (PORK) LOCK FLUSH 100 UNIT/ML IV SOLN
500.0000 [IU] | Freq: Once | INTRAVENOUS | Status: AC
  Administered 2014-03-04: 500 [IU] via INTRAVENOUS
  Filled 2014-03-04: qty 5

## 2014-03-04 MED ORDER — SODIUM CHLORIDE 0.9 % IJ SOLN
10.0000 mL | INTRAMUSCULAR | Status: AC | PRN
  Administered 2014-03-04: 10 mL via INTRAVENOUS
  Filled 2014-03-04: qty 10

## 2014-03-04 NOTE — Telephone Encounter (Signed)
sch pt appt and gave sch to pt-pt stated was not going to trmt afterwards

## 2014-03-04 NOTE — Progress Notes (Signed)
[facility]  [phone] [phone]  OFFICE PROGRESS NOTE   ID: [name]   DOB: Jun 12, 1956  MR#: [id number]  FHL#:[id number]   PCP: [name], MD [name], [name], [name] RAD HTD:[id number] [name], [name] OTHER:  [name], MD  CHIEF COMPLAINT:  Stage IV Breast [name]   HISTORY OF PRESENT ILLNESS: The patient originally presented in May 2003 when she noticed a lump in the upper inner quadrant of her right breast in September 2003.  She sought attention and had a mammogram which showed an obvious carcinoma in the upper inner quadrant of the right breast, approximately 2 cm.  There were some enlarged lymph nodes in the axilla and an FNA done showed those consistent with malignant cells, most likely an invasive ductal carcinoma.  At that point she was unsure of what to do and was referred by Dr. [name] for a discussion of her treatment options.  By biopsy, it was ER/PR negative and HER-2 was 3+.  DNA index was 1.42.  We reiterated that it was most important to have her disease surgically addressed at that point and had recommended lumpectomy and axillary nodes to be addressed with which she followed through and on 04-03-02, Dr. [name] performed a right partial mastectomy and a right axillary lymph node dissection.  Final pathology revealed a 2.4 cm. high grade, Grade III invasive ductal carcinoma with an adjacent .8 cm. also high grade invasive ductal carcinoma which was felt to represent an intramammary metastasis rather than a second primary.  A smaller mass was just medial to the larger mass.  The smaller mass was associated with high grade DCIS component.  There was no definite lymphovascular invasion identified.  However, one of fourteen axillary lymph nodes did contain a 1.5 cm. metastatic deposit.  Postoperatively she did very well.  We reiterated the fact that she did need adjuvant chemotherapy, however, she refused and  decided that she would pursue radiation.  She received radiation and completed that on 08-14-02. Her subsequent history is as detailed below.   INTERVAL HISTORY: [name] returns today accompanied by her daughter for followup of [name]. The patient decided against further treatment November of last year. She says she had a discussion with daughter and he told her she was getting too much chemotherapy. We have been following her off treatment and we just completely restaged her with CTs of the chest abdomen and pelvis and a PET scan. They show no evidence of active disease.  REVIEW OF SYSTEMS: [name] tells me her peripheral neuropathy has completely resolved. She has mild sinus problems and when she blows her nose sometimes there is a little bit of blood. There have been no unusual headaches, visual changes, dizziness, gait imbalance, nausea, or vomiting. She is a little and chest and a little depressed she says but not very much. There have been no symptoms suggestive of congestive heart failure. She exercises twice or 3 times a week at the gym. She is eating "a really good diet ". A detailed review of systems today was otherwise entirely negative.  PAST MEDICAL HISTORY: Past Medical History  Diagnosis Date  . DVT (deep venous thrombosis) 10/07/2011  . Breast [name]   . Hypertension   . Rheumatic fever     PAST SURGICAL HISTORY: Past Surgical History  Procedure Laterality Date  . Mastectomy      bilateral  . Breast surgery    . Port a cath revision  FAMILY HISTORY Family History  Problem Relation Age of Onset  . Pancreatic [name] Mother   . Kidney [name] Sister 79  . Breast [name] Sister 48  . Breast [name] Other 59  The patient's father died in his 19F  from complications of alcohol abuse. The patient's mother died in her 61s from pancreatic [name]. The patient has 6 brothers, 2 sisters. Both her sisters have had breast [name], both diagnosed after the age of  26. The patient has not had genetic testing so far.   GYNECOLOGIC HISTORY: Menarche age 79; first live birth age 82. She is GXP4. She is not sure when she stopped having periods. She never used hormone replacement.   SOCIAL HISTORY: The patient has worked in the past as a Psychologist, counselling. At home in addition to the patient are her husband [name], originally from [place], who works as a Administrator.  Also living in the home is her son [name] and her daughter [name] (she is studying to be a Marine scientist).  The other 2 children are [name], who has a college degree in psychology and works as a Probation officer; and [name], who lives in [place] and works as a Higher education careers adviser.   ADVANCED DIRECTIVES: Not in place  HEALTH MAINTENANCE: History  Substance Use Topics  . Smoking status: Light Tobacco Smoker  . Smokeless tobacco: Never Used  . Alcohol Use: Yes     Comment: Rarely     Colonoscopy:  Not on file  PAP:  Not on file  Bone density:  Not on file  Lipid panel:  Not on file    Allergies  Allergen Reactions  . Tramadol Nausea Only  . Hydrocodone-Acetaminophen Itching and Rash    Current Outpatient Prescriptions  Medication Sig Dispense Refill  . alprazolam (XANAX) 2 MG tablet Take 2 mg by mouth daily.       Marland Kitchen amLODipine (NORVASC) 10 MG tablet Take 10 mg by mouth. MON, WED, and FRI      . Ascorbic Acid (VITAMIN C) 100 MG tablet Take 100 mg by mouth daily.        . calcium-vitamin D (OSCAL WITH D) 500-200 MG-UNIT per tablet Take 1 tablet by mouth daily.        Marland Kitchen gabapentin (NEURONTIN) 100 MG capsule TAKE 1 CAPSULE BY MOUTH AT BEDTIME  30 capsule  1  . hydrochlorothiazide (HYDRODIURIL) 25 MG tablet TAKE 1 TABLET BY MOUTH 3 TIMES A WEEK ON MONDAY, WEDNESDAY, AND FRIDAY  30 tablet  1  . metaxalone (SKELAXIN) 800 MG tablet Take 800 mg by mouth 3 (three) times daily.       . Multiple Vitamin (MULTIVITAMIN) capsule Take 1 capsule by mouth daily.        . mupirocin ointment (BACTROBAN) 2 % Apply 1  application topically as needed.       Marland Kitchen oxyCODONE-acetaminophen (PERCOCET) 10-325 MG per tablet Take 1 tablet by mouth every 6 (six) hours as needed.       Marland Kitchen oxymorphone (OPANA) 10 MG tablet Take 10 mg by mouth 2 (two) times daily.       . promethazine (PHENERGAN) 25 MG tablet Take 1 tablet (25 mg total) by mouth every 6 (six) hours as needed for nausea.  30 tablet  0  . triamterene-hydrochlorothiazide (DYAZIDE) 37.5-25 MG per capsule TAKE 1 EACH (1 CAPSULE TOTAL) BY MOUTH EVERY MORNING.  30 capsule  2   No current facility-administered medications for this visit.   Facility-Administered Medications Ordered in Other Visits  Medication Dose Route Frequency Provider Last Rate Last Dose  . sodium chloride 0.9 % injection 10 mL  10 mL Intravenous PRN Chauncey Cruel, MD   10 mL at 03/04/14 1421    OBJECTIVE: Middle-aged [place] American woman who appears  well  Filed Vitals:   03/04/14 1436  BP: 138/92  Pulse: 98  Temp: 98.1 F (36.7 C)  Resp: 20     Body mass index is 29.78 kg/(m^2).    ECOG FS: 0 Filed Weights   03/04/14 1436  Weight: 184 lb 6.4 oz (83.643 kg)   Sclerae unicteric, pupils equal, round and reactive, EOMs intact  Oropharynx clear and moist No cervical or supraclavicular adenopathy Lungs no rales or rhonchi Heart regular rate and rhythm Abd soft, obese, nontender, positive bowel sounds MSK no focal spinal tenderness, chronic grade 1 right upper extremity lymphedema, and is in complete remission  Neuro: nonfocal, well oriented, positive affect Breasts: Status post bilateral mastectomies. There is no evidence of local recurrence. Both axillae are benign.   LAB RESULTS: forgetful and limited  Lab Results  Component Value Date   WBC 6.2 03/04/2014   NEUTROABS 3.5 03/04/2014   HGB 15.1 03/04/2014   HCT 45.8 03/04/2014   MCV 87.4 03/04/2014   PLT 135* 03/04/2014      Chemistry      Component Value Date/Time   NA 139 12/05/2013 0958   NA 138 10/02/2013 1640   K  3.7 12/05/2013 0958   K 3.5 10/02/2013 1640   CL 100 10/02/2013 1640   CL 101 03/07/2013 1411   CO2 25 12/05/2013 0958   CO2 30 10/02/2013 1640   BUN 9.9 12/05/2013 0958   BUN 9 10/02/2013 1640   CREATININE 0.8 12/05/2013 0958   CREATININE 0.71 10/02/2013 1640      Component Value Date/Time   CALCIUM 10.3 12/05/2013 0958   CALCIUM 10.0 10/02/2013 1640   ALKPHOS 202* 12/05/2013 0958   ALKPHOS 157* 10/02/2013 1640   AST 57* 12/05/2013 0958   AST 64* 10/02/2013 1640   ALT 30 12/05/2013 0958   ALT 28 10/02/2013 1640   BILITOT 1.09 12/05/2013 0958   BILITOT 0.8 10/02/2013 1640       STUDIES: Ct Soft Tissue Neck W Contrast  02/25/2014   CLINICAL DATA:  BREAST [name].  EXAM: CT NECK WITH CONTRAST  TECHNIQUE: Multidetector CT imaging of the neck was performed using the standard protocol following the bolus administration of intravenous contrast.  CONTRAST:  71m OMNIPAQUE IOHEXOL 300 MG/ML  SOLN  COMPARISON:  PET scan from the same day.  PET scan 02/05/2013.  FINDINGS: Limited imaging of the brain is unremarkable. Mild prominence of level 2 lymph nodes is stable. These are ovoid and appear benign. No pathologic nodes are evident.  No focal mucosal or submucosal lesions are present. The soft tissues of the neck are otherwise unremarkable.  Bone windows are within normal limits.  The lung apices are clear.  IMPRESSION: 1. Mild prominence of level 2 lymph nodes is stable from the prior study. These were not positive on the prior or current PET scan and are likely reactive and chronic. 2. No other evidence for metastatic disease in the neck.   Electronically Signed   By: CLawrence SantiagoM.D.   On: 02/25/2014 15:04   Ct Chest W Contrast  02/25/2014   CLINICAL DATA:  History of breast [name] with the lymphedema in the arm.  EXAM: CT CHEST WITH CONTRAST  TECHNIQUE: Multidetector CT imaging  of the chest was performed during intravenous contrast administration.  CONTRAST:  44m OMNIPAQUE IOHEXOL 300 MG/ML  SOLN   COMPARISON:  PET-CT 02/05/2013.  FINDINGS: Mediastinum: Heart size is borderline enlarged. There is no significant pericardial fluid, thickening or pericardial calcification. No pathologically enlarged mediastinal, hilar or internal mammary lymph nodes. Esophagus is unremarkable in appearance. Left-sided subclavian single-lumen porta cath with tip terminating in the right atrium.  Lungs/Pleura: Small amount of subpleural reticulation in the periphery of the anterior aspect of the right lung, most compatible with mild postradiation fibrosis. Lungs are otherwise clear, without suspicious appearing pulmonary nodules or masses, or consolidative airspace disease. No pleural effusions.  Upper Abdomen: Mild intra and extrahepatic biliary ductal dilatation, similar to prior examinations. Common bile duct measures approximately 11 mm in the porta hepatis. Sub cm low-attenuation lesion in segment 2 of the liver is incompletely characterized, but unchanged compared to remote prior examinations, favored to represent a small cyst.  Musculoskeletal: Status post bilateral modified radical mastectomy and right axillary nodal dissection, without evidence of soft tissue mass along the right chest wall to suggest local recurrence of disease. There are no aggressive appearing lytic or blastic lesions noted in the visualized portions of the skeleton.  IMPRESSION: 1. Status post bilateral modified radical mastectomy and right axillary nodal dissection with some mild postradiation fibrosis in the periphery of the anterior aspect of the right lung. No signs to suggest local recurrence of disease or new metastatic disease in the thorax. 2. Additional incidental findings, similar prior studies, as above.   Electronically Signed   By: DVinnie LangtonM.D.   On: 02/25/2014 15:05   Nm Pet Image Restag (ps) Skull Base To Thigh  02/25/2014   CLINICAL DATA:  Subsequent treatment strategy for breast [name]. Restaging examination  EXAM: NUCLEAR  MEDICINE PET SKULL BASE TO THIGH  TECHNIQUE: 9.3 mCi F-18 FDG was injected intravenously. Full-ring PET imaging was performed from the skull base to thigh after the radiotracer. CT data was obtained and used for attenuation correction and anatomic localization.  FASTING BLOOD GLUCOSE:  Value: 96 mg/dl  COMPARISON:  PET-CT 02/05/2013.  FINDINGS: NECK  No hypermetabolic lymph nodes in the neck.  CHEST  Status post bilateral modified radical mastectomy and right axillary nodal dissection. No hypermetabolic mediastinal or hilar nodes. No suspicious pulmonary nodules on the CT scan. Left-sided subclavian single-lumen porta cath with tip terminating in the right atrium. Heart size is borderline enlarged. There is no significant pericardial fluid, thickening or pericardial calcification. Esophagus is unremarkable in appearance. Subpleural reticulation throughout the periphery of the anterior aspect of the right lung, presumably some mild post radiation fibrosis.  ABDOMEN/PELVIS  No abnormal hypermetabolic activity within the liver, pancreas, adrenal glands, or spleen. No hypermetabolic lymph nodes in the abdomen or pelvis. 8 mm low attenuation lesion in segment 2 of the liver is incompletely characterized, but unchanged compared to the prior study, and favored to represent a small cyst. Common bile duct appears mildly dilated (12 mm in diameter), which is unchanged compared to the prior examination. There is also likely mild intrahepatic biliary ductal dilatation (also unchanged). Trace volume of free fluid in the cul-de-sac. No larger volume of ascites, no pneumoperitoneum and no pathologic distention of small bowel.  SKELETON  No focal hypermetabolic activity to suggest skeletal metastasis.  IMPRESSION: 1. Status post bilateral modified radical mastectomy and right axillary nodal dissection, without evidence to suggest local recurrence of disease or metastatic disease in the neck, chest, abdomen or pelvis  on today's  examination. 2. Mild intra and extrahepatic biliary ductal dilatation appears to be chronic in this patient and is unchanged compared to numerous prior examinations. 3. 8 mm low attenuation lesion in segment 2 of the liver is incompletely characterized, but stable compared to remote prior studies, favored to represent a small cyst. 4. Additional incidental findings, as above.   Electronically Signed   By: Vinnie Langton [name]   On: 02/25/2014 15:01     ASSESSMENT:  58 y.o. [place] woman with stage IV breast [name] manifested chiefly by loco-regional nodal disease (neck, chest), without lung, liver, bone, or brain involvement.  (1) Status post right lumpectomy and sentinel lymph node sampling 03/04/2002 for 2 separate foci of invasive ductal carcinoma, mpT2 pN1 or stage IIB, both foci grade 3, both estrogen and progesterone receptor positive, both HercepTest 3+, Mib-1 56%  (2) Reexcision for margins 05/27/2002 showed no residual [name] in the breast.  (3) The patient refused adjuvant systemic therapy.  (4) Adjuvant radiation treatment completed 08/14/2002.  (5) recurrence in the right breast in 02/2004 showing a morphologically different tumor, again grade 3, again estrogen and progesterone receptor negative, with an MIB-1 of 14% and Herceptest 3+.  (5) Between 03/2004 and 07/2004 she received dose dense Doxorubicin/Cyclophosphamide x 4 given with trastuzumab, followed by weekly Docetaxel x 8, again given with trastuzumab.  (6) Right mastectomy 07/13/2004 showed scattered microscopic foci of residual disease over an area  greater than 5 cm. Margins were negative.  (7) Postoperative Docetaxel continued until 09/2004.  (8) Trastuzumab (Herceptin) given 08/2004 through 01/2012 with some brief interruptions.  (9) Isolated right cervical nodal recurrence 10/2005, treated with radiation to the right supraclavicular area (total 41.5 gray) completed 12/29/2005.  (10) Navelbine given together  with Herceptin  11/2005 through 03/2006.  (9) Left mastectomy 02/13/2006 for ductal carcinoma in situ, grade 2, estrogen and progesterone receptor negative, with negative margins; 0 of 3 lymph nodes involved  (10) treated with Lapatinib and Capecitabine before 10/2009, for an unclear duration and with unclear results (cannot locate data on chart review).  (11) Status post right supraclavicular lymph node biopsy 09/2010 again positive for an invasive ductal carcinoma, estrogen and progesterone receptor negative, HER-2 positive by C[facility] with a ratio 4.25.  (12) Navelbine given together with Herceptin between 05/2011 and 11/2011.  (13) Carboplatin/ Gemcitabine/ Herceptin given for 2 cycles, in 12/2011 and 01/2012.  (14) TDM-1 (Kadcyla) started 02/2012. Last dose 10/02/2013 after which the patient discontinued treatment at her own discretion. Most recent echo on December 2014 showed a well preserved ejection fraction.  (15) Deep vein thrombosis of the right upper extremity documented 04/20/2011.  She completed anticoagulant therapy with Coumadin on 03/25/2013.  (16) Chronic right upper extremity lymphedema.  (17) Right chest port-a-cath removal due to infection on 01/28/2013. Left chest Port-A-Cath placed on 04/08/2013.   PLAN: [name] is in remission, and PET scan and CT scans do not show any evidence of active disease. We discussed the fact that as far as we know stage IV breast [name] is not curable, the fact that we cannot see the [name] does not mean that it is not clear microscopically, and that our concern is that we do not continue HER-2 suppression the [name] will grow back. I offered her the combination of trastuzumab and pertuzumab since she had problems taking the TDM-1 treatments.  However [name] is convinced that she is healed. She tells me.told her she was getting too much chemotherapy and that is why she stopped.  She absolutely refuses any other therapy at this point but she  does want to be followed closely. Incidentally her daughter was present today and urged her to take some further treatment but the patient is adamant  Accordingly what we will do is see [name] every 3 months indefinitely. We are going to obtain lab work before the visit including a CA 27-29, which she understands is not a very good marker but might alert [name] to early recurrence. Of course we will evaluate any suspicious symptoms aggressively.   [name] has a good understanding of this plan, which is in the heart planned. She agrees with it. She will call with any problems that may develop before her next visit here. Chauncey Cruel, MD  03/04/2014 2:46 PM

## 2014-03-04 NOTE — Addendum Note (Signed)
Addended by: Laureen Abrahams on: 03/04/2014 04:30 PM   Modules accepted: Orders

## 2014-04-20 ENCOUNTER — Other Ambulatory Visit: Payer: Self-pay | Admitting: Oncology

## 2014-05-05 ENCOUNTER — Other Ambulatory Visit: Payer: Self-pay | Admitting: Oncology

## 2014-05-29 ENCOUNTER — Ambulatory Visit (HOSPITAL_BASED_OUTPATIENT_CLINIC_OR_DEPARTMENT_OTHER): Payer: Commercial Managed Care - PPO

## 2014-05-29 ENCOUNTER — Other Ambulatory Visit (HOSPITAL_BASED_OUTPATIENT_CLINIC_OR_DEPARTMENT_OTHER): Payer: Commercial Managed Care - PPO

## 2014-05-29 VITALS — BP 134/63 | HR 83

## 2014-05-29 DIAGNOSIS — C50219 Malignant neoplasm of upper-inner quadrant of unspecified female breast: Secondary | ICD-10-CM

## 2014-05-29 DIAGNOSIS — C770 Secondary and unspecified malignant neoplasm of lymph nodes of head, face and neck: Secondary | ICD-10-CM

## 2014-05-29 DIAGNOSIS — Z95828 Presence of other vascular implants and grafts: Secondary | ICD-10-CM

## 2014-05-29 DIAGNOSIS — C50211 Malignant neoplasm of upper-inner quadrant of right female breast: Secondary | ICD-10-CM

## 2014-05-29 DIAGNOSIS — C773 Secondary and unspecified malignant neoplasm of axilla and upper limb lymph nodes: Secondary | ICD-10-CM

## 2014-05-29 DIAGNOSIS — I82409 Acute embolism and thrombosis of unspecified deep veins of unspecified lower extremity: Secondary | ICD-10-CM

## 2014-05-29 DIAGNOSIS — Z452 Encounter for adjustment and management of vascular access device: Secondary | ICD-10-CM

## 2014-05-29 DIAGNOSIS — I890 Lymphedema, not elsewhere classified: Secondary | ICD-10-CM

## 2014-05-29 LAB — CBC WITH DIFFERENTIAL/PLATELET
BASO%: 1.1 % (ref 0.0–2.0)
Basophils Absolute: 0.1 10*3/uL (ref 0.0–0.1)
EOS%: 3.3 % (ref 0.0–7.0)
Eosinophils Absolute: 0.2 10*3/uL (ref 0.0–0.5)
HCT: 42.7 % (ref 34.8–46.6)
HGB: 13.7 g/dL (ref 11.6–15.9)
LYMPH%: 23.8 % (ref 14.0–49.7)
MCH: 28.5 pg (ref 25.1–34.0)
MCHC: 32.1 g/dL (ref 31.5–36.0)
MCV: 88.8 fL (ref 79.5–101.0)
MONO#: 0.4 10*3/uL (ref 0.1–0.9)
MONO%: 7.6 % (ref 0.0–14.0)
NEUT#: 3.6 10*3/uL (ref 1.5–6.5)
NEUT%: 64.2 % (ref 38.4–76.8)
Platelets: 114 10*3/uL — ABNORMAL LOW (ref 145–400)
RBC: 4.81 10*6/uL (ref 3.70–5.45)
RDW: 13.5 % (ref 11.2–14.5)
WBC: 5.6 10*3/uL (ref 3.9–10.3)
lymph#: 1.3 10*3/uL (ref 0.9–3.3)

## 2014-05-29 LAB — COMPREHENSIVE METABOLIC PANEL (CC13)
ALT: 23 U/L (ref 0–55)
AST: 40 U/L — ABNORMAL HIGH (ref 5–34)
Albumin: 3.6 g/dL (ref 3.5–5.0)
Alkaline Phosphatase: 191 U/L — ABNORMAL HIGH (ref 40–150)
Anion Gap: 9 mEq/L (ref 3–11)
BUN: 10.6 mg/dL (ref 7.0–26.0)
CO2: 28 mEq/L (ref 22–29)
Calcium: 9.8 mg/dL (ref 8.4–10.4)
Chloride: 105 mEq/L (ref 98–109)
Creatinine: 0.8 mg/dL (ref 0.6–1.1)
Glucose: 127 mg/dl (ref 70–140)
Potassium: 3.6 mEq/L (ref 3.5–5.1)
Sodium: 142 mEq/L (ref 136–145)
Total Bilirubin: 0.51 mg/dL (ref 0.20–1.20)
Total Protein: 7.5 g/dL (ref 6.4–8.3)

## 2014-05-29 MED ORDER — SODIUM CHLORIDE 0.9 % IJ SOLN
10.0000 mL | INTRAMUSCULAR | Status: DC | PRN
  Administered 2014-05-29: 10 mL via INTRAVENOUS
  Filled 2014-05-29: qty 10

## 2014-05-29 MED ORDER — HEPARIN SOD (PORK) LOCK FLUSH 100 UNIT/ML IV SOLN
500.0000 [IU] | Freq: Once | INTRAVENOUS | Status: AC
Start: 2014-05-29 — End: 2014-05-29
  Administered 2014-05-29: 500 [IU] via INTRAVENOUS
  Filled 2014-05-29: qty 5

## 2014-05-30 LAB — CANCER ANTIGEN 27.29: CA 27.29: 29 U/mL (ref 0–39)

## 2014-06-05 ENCOUNTER — Other Ambulatory Visit: Payer: Self-pay | Admitting: *Deleted

## 2014-06-05 ENCOUNTER — Telehealth: Payer: Self-pay | Admitting: Oncology

## 2014-06-05 ENCOUNTER — Ambulatory Visit (HOSPITAL_BASED_OUTPATIENT_CLINIC_OR_DEPARTMENT_OTHER): Payer: Commercial Managed Care - PPO | Admitting: Oncology

## 2014-06-05 VITALS — BP 138/94 | HR 77 | Temp 98.3°F | Resp 18 | Ht 66.0 in | Wt 193.4 lb

## 2014-06-05 DIAGNOSIS — C50219 Malignant neoplasm of upper-inner quadrant of unspecified female breast: Secondary | ICD-10-CM

## 2014-06-05 DIAGNOSIS — Z86718 Personal history of other venous thrombosis and embolism: Secondary | ICD-10-CM

## 2014-06-05 DIAGNOSIS — Z7901 Long term (current) use of anticoagulants: Secondary | ICD-10-CM

## 2014-06-05 DIAGNOSIS — C50919 Malignant neoplasm of unspecified site of unspecified female breast: Secondary | ICD-10-CM

## 2014-06-05 DIAGNOSIS — C778 Secondary and unspecified malignant neoplasm of lymph nodes of multiple regions: Secondary | ICD-10-CM

## 2014-06-05 DIAGNOSIS — Z170 Estrogen receptor positive status [ER+]: Secondary | ICD-10-CM

## 2014-06-05 NOTE — Progress Notes (Signed)
[place]  [phone]) [phone] Fax:([phone]) [phone]  OFFICE PROGRESS NOTE   ID: [name]   DOB: 03-22-1956  MR#: [id number]  RSW#:[id number]   PCP: [name], MD [name], [name], [name] RAD KXF:GHWEXHB [name], [name] OTHER:  [name], MD  CHIEF COMPLAINT:  Stage IV Breast Cancer CURRENT TREATMENT: Observation   BREAST CANCER HISTORY: From the original intake note:  The patient originally presented in May 2003 when she noticed a lump in the upper inner quadrant of her right breast in September 2003.  She sought attention and had a mammogram which showed an obvious carcinoma in the upper inner quadrant of the right breast, approximately 2 cm.  There were some enlarged lymph nodes in the axilla and an FNA done showed those consistent with malignant cells, most likely an invasive ductal carcinoma.  At that point she was unsure of what to do and was referred by Dr. [name] for a discussion of her treatment options.  By biopsy, it was ER/PR negative and HER-2 was 3+.  DNA index was 1.42.  We reiterated that it was most important to have her disease surgically addressed at that point and had recommended lumpectomy and axillary nodes to be addressed with which she followed through and on 04-03-02, Dr. [name] performed a right partial mastectomy and a right axillary lymph node dissection.  Final pathology revealed a 2.4 cm. high grade, Grade III invasive ductal carcinoma with an adjacent .8 cm. also high grade invasive ductal carcinoma which was felt to represent an intramammary metastasis rather than a second primary.  A smaller mass was just medial to the larger mass.  The smaller mass was associated with high grade DCIS component.  There was no definite lymphovascular invasion identified.  However, one of fourteen axillary lymph nodes did contain a 1.5 cm. metastatic deposit.  Postoperatively she did very well.  We reiterated the fact that she  did need adjuvant chemotherapy, however, she refused and decided that she would pursue radiation.  She received radiation and completed that on 08-14-02. Her subsequent history is as detailed below.   INTERVAL HISTORY: [name] returns today accompanied by her daughter for followup of [name] breast cancer. Since her last visit here the patient has been traveling, to [place], [place], and [place], mostly visiting family. She feels "healthy again".  REVIEW OF SYSTEMS: [name] is doing "terrific". A detailed review of systems today was entirely negative.  PAST MEDICAL HISTORY: Past Medical History  Diagnosis Date  . DVT (deep venous thrombosis) 10/07/2011  . Breast cancer   . Hypertension   . Rheumatic fever     PAST SURGICAL HISTORY: Past Surgical History  Procedure Laterality Date  . Mastectomy      bilateral  . Breast surgery    . Port a cath revision      FAMILY HISTORY Family History  Problem Relation Age of Onset  . Pancreatic cancer Mother   . Kidney cancer Sister 25  . Breast cancer Sister 43  . Breast cancer Other 58  The patient's father died in his 71I  from complications of alcohol abuse. The patient's mother died in her 29s from pancreatic cancer. The patient has 6 brothers, 2 sisters. Both her sisters have had breast cancer, both diagnosed after the age of 75. The patient has not had genetic testing so far.   GYNECOLOGIC HISTORY: Menarche age 11; first live birth age 32. She is GXP4. She is not sure when she stopped having  periods. She never used hormone replacement.   SOCIAL HISTORY: The patient has worked in the past as a Psychologist, counselling. At home in addition to the patient are her husband [name], originally from [place], who works as a Administrator.  Also living in the home is her son [name] and her daughter [name] (she is studying to be a Marine scientist).  The other 2 children are [name], who has a college degree in psychology and works as a Probation officer; and  [name], who lives in [place] and works as a Higher education careers adviser.   ADVANCED DIRECTIVES: Not in place  HEALTH MAINTENANCE: History  Substance Use Topics  . Smoking status: Light Tobacco Smoker  . Smokeless tobacco: Never Used  . Alcohol Use: Yes     Comment: Rarely     Colonoscopy:  Not on file  PAP:  Not on file  Bone density:  Not on file  Lipid panel:  Not on file    Allergies  Allergen Reactions  . Tramadol Nausea Only  . Hydrocodone-Acetaminophen Itching and Rash    Current Outpatient Prescriptions  Medication Sig Dispense Refill  . alprazolam (XANAX) 2 MG tablet Take 2 mg by mouth daily.       Marland Kitchen amLODipine (NORVASC) 10 MG tablet Take 10 mg by mouth. MON, WED, and FRI      . Ascorbic Acid (VITAMIN C) 100 MG tablet Take 100 mg by mouth daily.        . calcium-vitamin D (OSCAL WITH D) 500-200 MG-UNIT per tablet Take 1 tablet by mouth daily.        Marland Kitchen gabapentin (NEURONTIN) 100 MG capsule TAKE 1 CAPSULE BY MOUTH AT BEDTIME  30 capsule  1  . hydrochlorothiazide (HYDRODIURIL) 25 MG tablet TAKE 1 TABLET BY MOUTH 3 TIMES A WEEK ON MONDAY, WEDNESDAY, AND FRIDAY  30 tablet  1  . Multiple Vitamin (MULTIVITAMIN) capsule Take 1 capsule by mouth daily.        Marland Kitchen oxyCODONE-acetaminophen (PERCOCET) 10-325 MG per tablet Take 1 tablet by mouth every 6 (six) hours as needed.       . triamterene-hydrochlorothiazide (DYAZIDE) 37.5-25 MG per capsule TAKE 1 EACH (1 CAPSULE TOTAL) BY MOUTH EVERY MORNING.  30 capsule  2   No current facility-administered medications for this visit.   Facility-Administered Medications Ordered in Other Visits  Medication Dose Route Frequency Provider Last Rate Last Dose  . sodium chloride 0.9 % injection 10 mL  10 mL Intravenous PRN Chauncey Cruel, MD   10 mL at 03/04/14 1421    OBJECTIVE: Middle-aged [place] American woman in no acute distress Filed Vitals:   06/05/14 1514  BP: 138/94  Pulse: 77  Temp: 98.3 F (36.8 C)  Resp: 18     Body mass index is  31.23 kg/(m^2).    ECOG FS: 0 Filed Weights   06/05/14 1514  Weight: 193 lb 6.4 oz (87.726 kg)   Sclerae unicteric, pupils equal and reactive Oropharynx clear and moist-- no thrush No cervical or supraclavicular adenopathy Lungs no rales or rhonchi Heart regular rate and rhythm Abd soft, obese, nontender, positive bowel sounds MSK no focal spinal tenderness, grade 1 right upper extremity lymphedema, stable Neuro: nonfocal, well oriented, appropriate affect Breasts: Status post bilateral mastectomies. There is no evidence of local recurrence. Both axillae are benign.  LAB RESULTS:.  Results for [name], [name] (MRN [id number]) as of 06/05/2014 15:34  Ref. Range 04/05/2012 10:37 04/26/2012 10:55 05/17/2012 09:49 10/05/2012 14:28 05/29/2014 13:17  CA 27.29 Latest Range:  0-39 U/mL 38 45 (H) 54 (H) 37 29     Lab Results  Component Value Date   WBC 5.6 05/29/2014   NEUTROABS 3.6 05/29/2014   HGB 13.7 05/29/2014   HCT 42.7 05/29/2014   MCV 88.8 05/29/2014   PLT 114* 05/29/2014      Chemistry      Component Value Date/Time   NA 142 05/29/2014 1317   NA 138 10/02/2013 1640   K 3.6 05/29/2014 1317   K 3.5 10/02/2013 1640   CL 100 10/02/2013 1640   CL 101 03/07/2013 1411   CO2 28 05/29/2014 1317   CO2 30 10/02/2013 1640   BUN 10.6 05/29/2014 1317   BUN 9 10/02/2013 1640   CREATININE 0.8 05/29/2014 1317   CREATININE 0.71 10/02/2013 1640      Component Value Date/Time   CALCIUM 9.8 05/29/2014 1317   CALCIUM 10.0 10/02/2013 1640   ALKPHOS 191* 05/29/2014 1317   ALKPHOS 157* 10/02/2013 1640   AST 40* 05/29/2014 1317   AST 64* 10/02/2013 1640   ALT 23 05/29/2014 1317   ALT 28 10/02/2013 1640   BILITOT 0.51 05/29/2014 1317   BILITOT 0.8 10/02/2013 1640       STUDIES: No results found.  ASSESSMENT:  58 y.o. [place] woman with stage IV breast cancer manifested chiefly by loco-regional nodal disease (neck, chest), without lung, liver, bone, or brain involvement.  (1) Status post right  lumpectomy and sentinel lymph node sampling 03/04/2002 for 2 separate foci of invasive ductal carcinoma, mpT2 pN1 or stage IIB, both foci grade 3, both estrogen and progesterone receptor positive, both HercepTest 3+, Mib-1 56%  (2) Reexcision for margins 05/27/2002 showed no residual cancer in the breast.  (3) The patient refused adjuvant systemic therapy.  (4) Adjuvant radiation treatment completed 08/14/2002.  (5) recurrence in the right breast in 02/2004 showing a morphologically different tumor, again grade 3, again estrogen and progesterone receptor negative, with an MIB-1 of 14% and Herceptest 3+.  (5) Between 03/2004 and 07/2004 she received dose dense Doxorubicin/Cyclophosphamide x 4 given with trastuzumab, followed by weekly Docetaxel x 8, again given with trastuzumab.  (6) Right mastectomy 07/13/2004 showed scattered microscopic foci of residual disease over an area  greater than 5 cm. Margins were negative.  (7) Postoperative Docetaxel continued until 09/2004.  (8) Trastuzumab (Herceptin) given 08/2004 through 01/2012 with some brief interruptions.  (9) Isolated right cervical nodal recurrence 10/2005, treated with radiation to the right supraclavicular area (total 41.5 gray) completed 12/29/2005.  (10) Navelbine given together with Herceptin  11/2005 through 03/2006.  (9) Left mastectomy 02/13/2006 for ductal carcinoma in situ, grade 2, estrogen and progesterone receptor negative, with negative margins; 0 of 3 lymph nodes involved  (10) treated with Lapatinib and Capecitabine before 10/2009, for an unclear duration and with unclear results (cannot locate data on chart review).  (11) Status post right supraclavicular lymph node biopsy 09/2010 again positive for an invasive ductal carcinoma, estrogen and progesterone receptor negative, HER-2 positive by CISH with a ratio 4.25.  (12) Navelbine given together with Herceptin between 05/2011 and 11/2011.  (13) Carboplatin/  Gemcitabine/ Herceptin given for 2 cycles, in 12/2011 and 01/2012.  (14) TDM-1 (Kadcyla) started 02/2012. Last dose 10/02/2013 after which the patient discontinued treatment at her own discretion. Most recent echo on December 2014 showed a well preserved ejection fraction.  (15) Deep vein thrombosis of the right upper extremity documented 04/20/2011.  She completed anticoagulant therapy with Coumadin on 03/25/2013.  (16) Chronic right  upper extremity lymphedema.  (17) Right chest port-a-cath removal due to infection on 01/28/2013. Left chest Port-A-Cath placed on 04/08/2013; be flushed every 6 weeks   PLAN: [name] is doing terrific of treatment. She does want to be "watched closely", so we will continue to obtain labwork every 6 weeks when she gets the port flushed. We will also obtain a chest x-ray before her next visit here which will be in about 3 months.  She knows to call us for any unusual pain, fever, or any new symptom, so we can reinstitute therapy quickly if there is any evidence of disease activity. In the meantime I have encouraged her to increase her exercise program and gave her a [employer]. I have also referred her back to her physical therapy department so she can be remeasured for her compression sleeve and review the arm exercises.  She has a good understanding of this plan. She agrees with it. She knows the goal of treatment in her case is control. She will call with any problems that may develop before the next visit.  Chauncey Cruel, MD  06/05/2014 3:40 PM

## 2014-06-05 NOTE — Telephone Encounter (Signed)
, °

## 2014-06-11 ENCOUNTER — Ambulatory Visit: Payer: Commercial Managed Care - PPO | Attending: Oncology | Admitting: Physical Therapy

## 2014-06-11 DIAGNOSIS — Z923 Personal history of irradiation: Secondary | ICD-10-CM | POA: Diagnosis not present

## 2014-06-11 DIAGNOSIS — Z9221 Personal history of antineoplastic chemotherapy: Secondary | ICD-10-CM | POA: Diagnosis not present

## 2014-06-11 DIAGNOSIS — I890 Lymphedema, not elsewhere classified: Secondary | ICD-10-CM | POA: Insufficient documentation

## 2014-06-11 DIAGNOSIS — Z86718 Personal history of other venous thrombosis and embolism: Secondary | ICD-10-CM | POA: Insufficient documentation

## 2014-06-11 DIAGNOSIS — Z9010 Acquired absence of unspecified breast and nipple: Secondary | ICD-10-CM | POA: Insufficient documentation

## 2014-06-11 DIAGNOSIS — Z79899 Other long term (current) drug therapy: Secondary | ICD-10-CM | POA: Diagnosis not present

## 2014-06-11 DIAGNOSIS — R52 Pain, unspecified: Secondary | ICD-10-CM | POA: Insufficient documentation

## 2014-06-11 DIAGNOSIS — C50919 Malignant neoplasm of unspecified site of unspecified female breast: Secondary | ICD-10-CM | POA: Diagnosis not present

## 2014-06-11 DIAGNOSIS — I10 Essential (primary) hypertension: Secondary | ICD-10-CM | POA: Insufficient documentation

## 2014-06-11 DIAGNOSIS — IMO0001 Reserved for inherently not codable concepts without codable children: Secondary | ICD-10-CM | POA: Diagnosis present

## 2014-06-19 ENCOUNTER — Telehealth: Payer: Self-pay

## 2014-06-19 NOTE — Telephone Encounter (Signed)
Faxed order for PT compression sleeve to [facility].  Sent to scan

## 2014-06-23 ENCOUNTER — Encounter: Payer: Self-pay | Admitting: Oncology

## 2014-06-23 NOTE — Progress Notes (Signed)
Put daughter's fmla form on nurse's desk. °

## 2014-07-03 NOTE — Telephone Encounter (Signed)
Encounter was telephone call. 

## 2014-07-10 ENCOUNTER — Ambulatory Visit (HOSPITAL_BASED_OUTPATIENT_CLINIC_OR_DEPARTMENT_OTHER): Payer: Commercial Managed Care - PPO

## 2014-07-10 ENCOUNTER — Other Ambulatory Visit (HOSPITAL_BASED_OUTPATIENT_CLINIC_OR_DEPARTMENT_OTHER): Payer: Commercial Managed Care - PPO

## 2014-07-10 VITALS — BP 108/65 | HR 85 | Temp 98.2°F

## 2014-07-10 DIAGNOSIS — C50211 Malignant neoplasm of upper-inner quadrant of right female breast: Secondary | ICD-10-CM

## 2014-07-10 DIAGNOSIS — C50219 Malignant neoplasm of upper-inner quadrant of unspecified female breast: Secondary | ICD-10-CM

## 2014-07-10 DIAGNOSIS — I82409 Acute embolism and thrombosis of unspecified deep veins of unspecified lower extremity: Secondary | ICD-10-CM

## 2014-07-10 DIAGNOSIS — I890 Lymphedema, not elsewhere classified: Secondary | ICD-10-CM

## 2014-07-10 DIAGNOSIS — Z95828 Presence of other vascular implants and grafts: Secondary | ICD-10-CM

## 2014-07-10 LAB — CBC WITH DIFFERENTIAL/PLATELET
BASO%: 1.2 % (ref 0.0–2.0)
Basophils Absolute: 0.1 10*3/uL (ref 0.0–0.1)
EOS%: 1.2 % (ref 0.0–7.0)
Eosinophils Absolute: 0.1 10*3/uL (ref 0.0–0.5)
HCT: 44.8 % (ref 34.8–46.6)
HGB: 14.7 g/dL (ref 11.6–15.9)
LYMPH%: 30.8 % (ref 14.0–49.7)
MCH: 28.4 pg (ref 25.1–34.0)
MCHC: 32.8 g/dL (ref 31.5–36.0)
MCV: 86.7 fL (ref 79.5–101.0)
MONO#: 0.6 10*3/uL (ref 0.1–0.9)
MONO%: 5.9 % (ref 0.0–14.0)
NEUT#: 6.1 10*3/uL (ref 1.5–6.5)
NEUT%: 60.9 % (ref 38.4–76.8)
Platelets: 206 10*3/uL (ref 145–400)
RBC: 5.17 10*6/uL (ref 3.70–5.45)
RDW: 13.4 % (ref 11.2–14.5)
WBC: 10.1 10*3/uL (ref 3.9–10.3)
lymph#: 3.1 10*3/uL (ref 0.9–3.3)

## 2014-07-10 LAB — COMPREHENSIVE METABOLIC PANEL (CC13)
ALT: 33 U/L (ref 0–55)
AST: 49 U/L — ABNORMAL HIGH (ref 5–34)
Albumin: 4.2 g/dL (ref 3.5–5.0)
Alkaline Phosphatase: 173 U/L — ABNORMAL HIGH (ref 40–150)
Anion Gap: 12 mEq/L — ABNORMAL HIGH (ref 3–11)
BUN: 19.7 mg/dL (ref 7.0–26.0)
CO2: 28 mEq/L (ref 22–29)
Calcium: 10.3 mg/dL (ref 8.4–10.4)
Chloride: 101 mEq/L (ref 98–109)
Creatinine: 1 mg/dL (ref 0.6–1.1)
Glucose: 132 mg/dl (ref 70–140)
Potassium: 3.5 mEq/L (ref 3.5–5.1)
Sodium: 141 mEq/L (ref 136–145)
Total Bilirubin: 0.4 mg/dL (ref 0.20–1.20)
Total Protein: 8.5 g/dL — ABNORMAL HIGH (ref 6.4–8.3)

## 2014-07-10 MED ORDER — HEPARIN SOD (PORK) LOCK FLUSH 100 UNIT/ML IV SOLN
500.0000 [IU] | Freq: Once | INTRAVENOUS | Status: AC
  Administered 2014-07-10: 500 [IU] via INTRAVENOUS
  Filled 2014-07-10: qty 5

## 2014-07-10 MED ORDER — SODIUM CHLORIDE 0.9 % IJ SOLN
10.0000 mL | INTRAMUSCULAR | Status: DC | PRN
  Administered 2014-07-10: 10 mL via INTRAVENOUS
  Filled 2014-07-10: qty 10

## 2014-07-10 NOTE — Patient Instructions (Signed)

## 2014-07-11 IMAGING — PT NM PET TUM IMG RESTAG (PS) SKULL BASE T - THIGH
7 series · 25 of 25 positions shown · non-contrast
Comparison: PET-CT 02/05/2013.

CLINICAL DATA: Subsequent treatment strategy for breast cancer.
Restaging examination

EXAM:
NUCLEAR MEDICINE PET SKULL BASE TO THIGH
TECHNIQUE: 9.3 mCi F-18 FDG was injected intravenously. Full-ring PET imaging
was performed from the skull base to thigh after the radiotracer. CT
data was obtained and used for attenuation correction and anatomic
localization.
FASTING BLOOD GLUCOSE:  Value: 96 mg/dl

[Series 3: pet sk_thigh ac · axial · 5.0mm · 4.07mm/px · z∈[-1204,-444]mm · 5 of 191 slices shown]
[im 1/191]
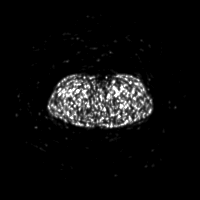
[im 48/191]
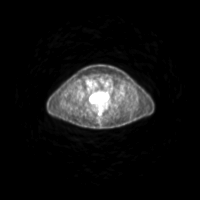
[im 96/191]
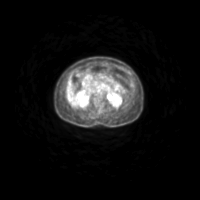
[im 143/191]
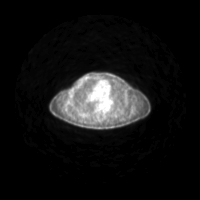
[im 191/191]
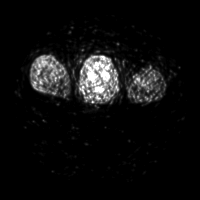

[Series 4: ct sk_thigh 5.0 b31f · axial · 5.0mm · 0.98mm/px · z∈[-1204,-444]mm · 5 of 191 slices shown]
[im 1/191]
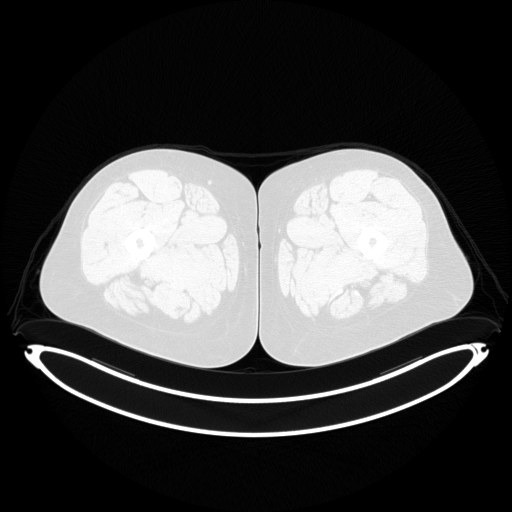
[im 48/191]
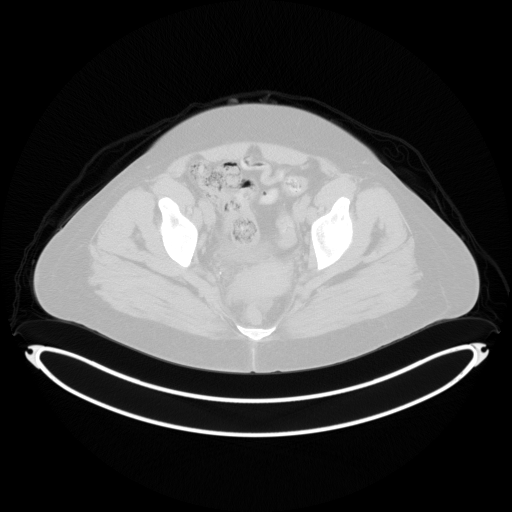
[im 96/191]
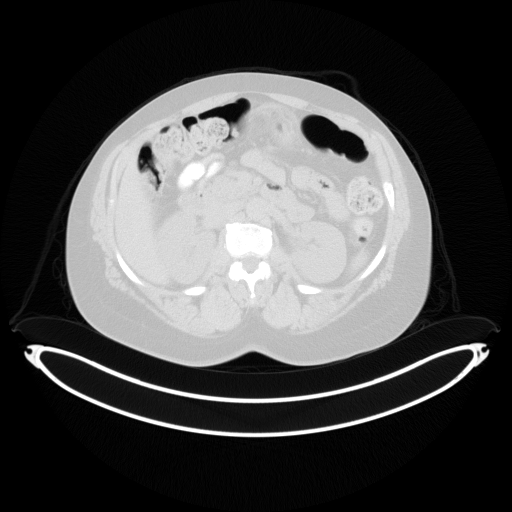
[im 143/191]
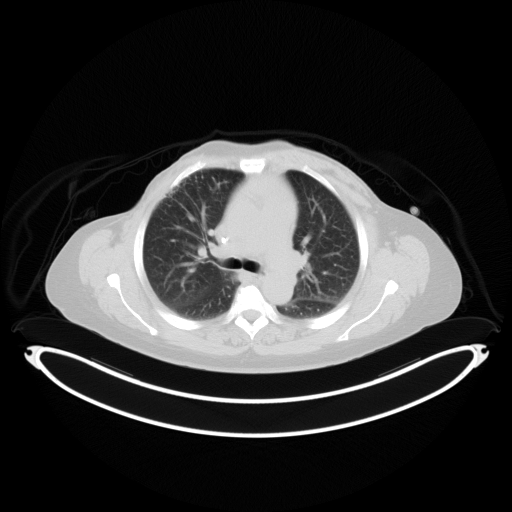
[im 191/191]
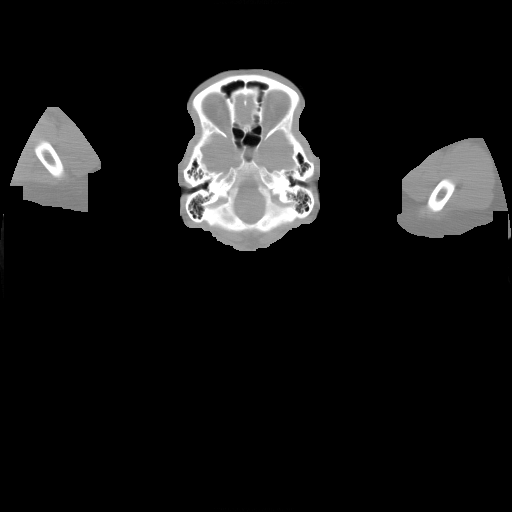

[Series 7: pet sk_thigh nac · axial · 5.0mm · 4.07mm/px · z∈[-1204,-444]mm · 5 of 191 slices shown]
[im 1/191]
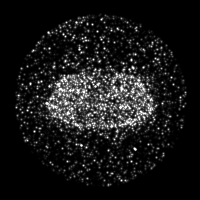
[im 48/191]
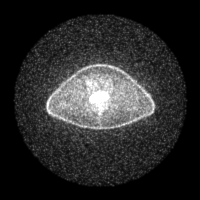
[im 96/191]
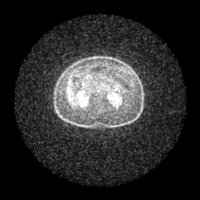
[im 143/191]
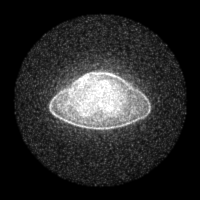
[im 191/191]
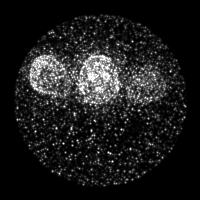

[Series 8: ct sk_thigh 5.0 b70f (id)_bone · axial · 5.0mm · 0.59mm/px · z∈[-778,-550]mm · 2 of 58 slices shown]
[im 1/58  bone]
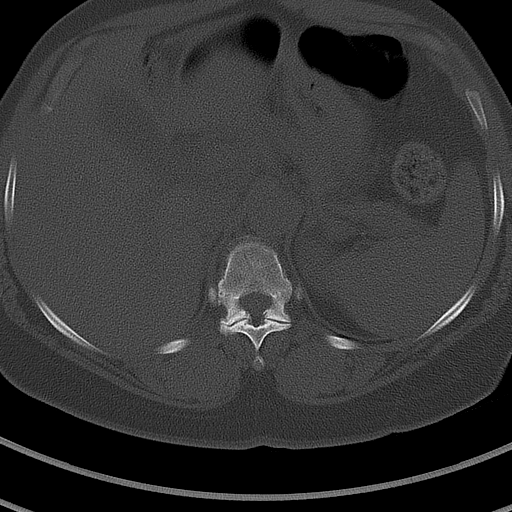
[im 58/58  bone]
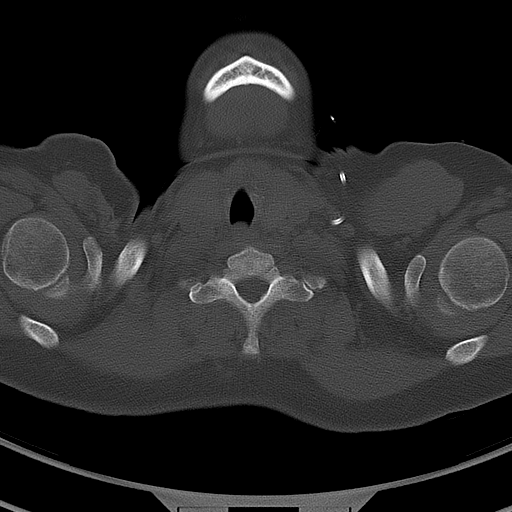

[Series 603: mip collection<mip range> · coronal · 1.68mm/px · 1 of 32 slices shown]
[im 1/32]
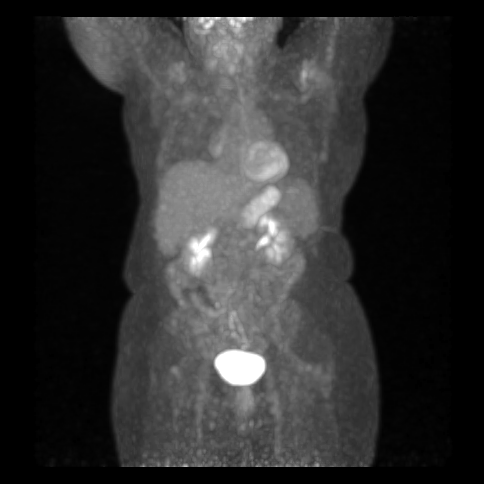

[Series 604: range-ct sk_thigh 5.0 (id)<alpha range> · 2 of 77 slices shown (1 of 2)]
[im 1/77]
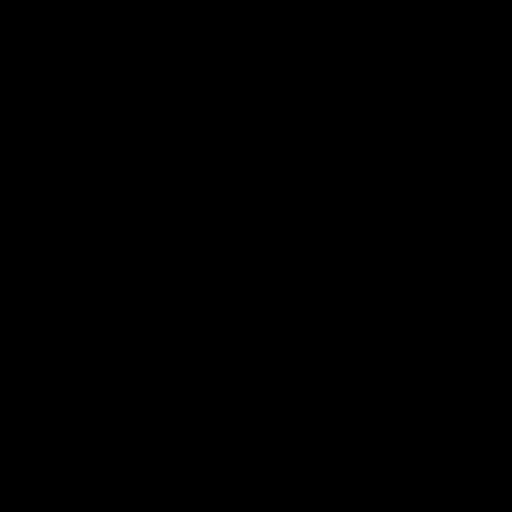
[im 77/77]
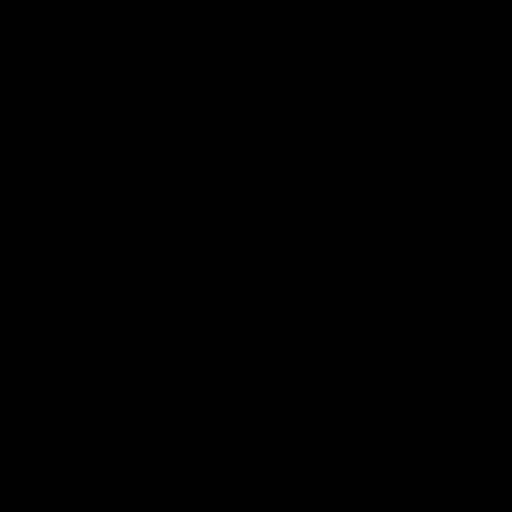

[Series 605: range-ct sk_thigh 5.0 (id)<alpha range> · 5 of 185 slices shown (2 of 2)]
[im 1/185]
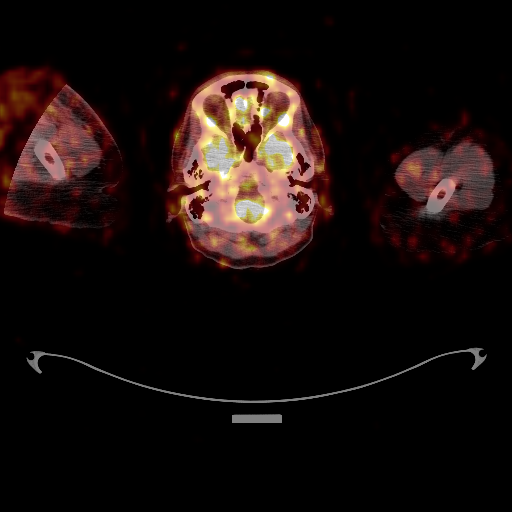
[im 47/185]
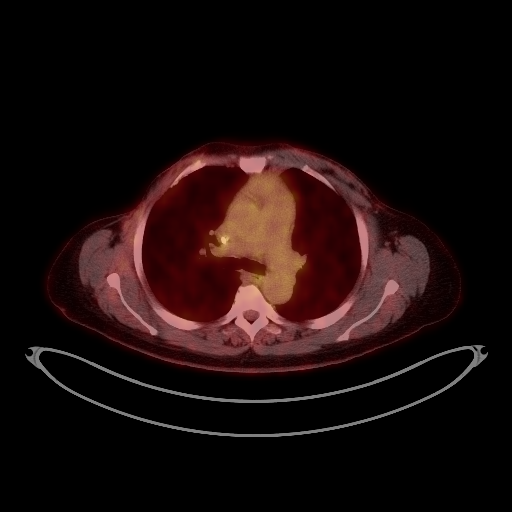
[im 93/185]
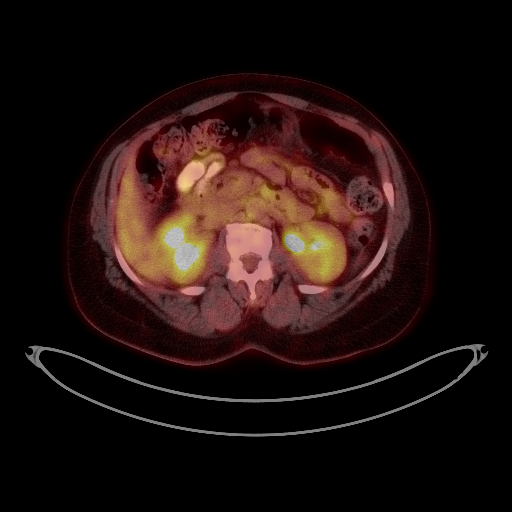
[im 139/185]
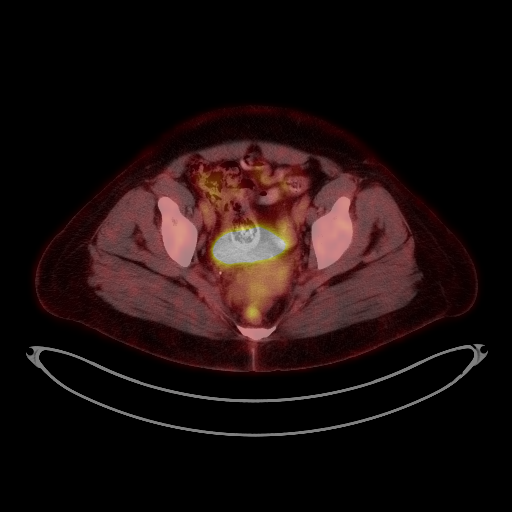
[im 185/185]
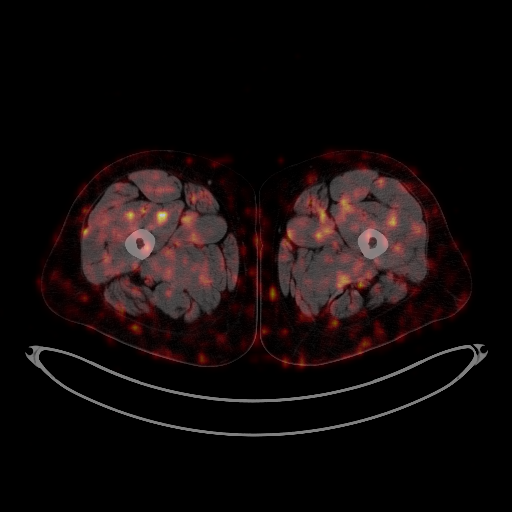

[25 of 25 positions shown; findings below may reference images not displayed]

FINDINGS: NECK

No hypermetabolic lymph nodes in the neck.

CHEST

Status post bilateral modified radical mastectomy and right axillary
nodal dissection. No hypermetabolic mediastinal or hilar nodes. No
suspicious pulmonary nodules on the CT scan. Left-sided subclavian
single-lumen porta cath with tip terminating in the right atrium.
Heart size is borderline enlarged. There is no significant
pericardial fluid, thickening or pericardial calcification.
Esophagus is unremarkable in appearance. Subpleural reticulation
throughout the periphery of the anterior aspect of the right lung,
presumably some mild post radiation fibrosis.

ABDOMEN/PELVIS

No abnormal hypermetabolic activity within the liver, pancreas,
adrenal glands, or spleen. No hypermetabolic lymph nodes in the
abdomen or pelvis. 8 mm low attenuation lesion in segment 2 of the
liver is incompletely characterized, but unchanged compared to the
prior study, and favored to represent a small cyst. Common bile duct
appears mildly dilated (12 mm in diameter), which is unchanged
compared to the prior examination. There is also likely mild
intrahepatic biliary ductal dilatation (also unchanged). Trace
volume of free fluid in the cul-de-sac. No larger volume of ascites,
no pneumoperitoneum and no pathologic distention of small bowel.

SKELETON

No focal hypermetabolic activity to suggest skeletal metastasis.
IMPRESSION: 1. Status post bilateral modified radical mastectomy and right
axillary nodal dissection, without evidence to suggest local
recurrence of disease or metastatic disease in the neck, chest,
abdomen or pelvis on today's examination.
2. Mild intra and extrahepatic biliary ductal dilatation appears to
be chronic in this patient and is unchanged compared to numerous
prior examinations.
3. 8 mm low attenuation lesion in segment 2 of the liver is
incompletely characterized, but stable compared to remote prior
studies, favored to represent a small cyst.
4. Additional incidental findings, as above.

## 2014-07-11 IMAGING — CT CT NECK W/ CM
3 of 13 series · 9 of 33 positions shown, 10 images · IV contrast (OMNIPAQUE)
Comparison: PET scan from the same day.  PET scan 02/05/2013.

CLINICAL DATA: BREAST CANCER.

EXAM:
CT NECK WITH CONTRAST
TECHNIQUE: Multidetector CT imaging of the neck was performed using the
standard protocol following the bolus administration of intravenous
contrast.
CONTRAST:  80mL OMNIPAQUE IOHEXOL 300 MG/ML  SOLN

[Series 6: neck st · axial · 0.39mm/px · z∈[-82,+40]mm · 3 of 123 slices shown, 4 images]
[im 31/123  soft-tissue]
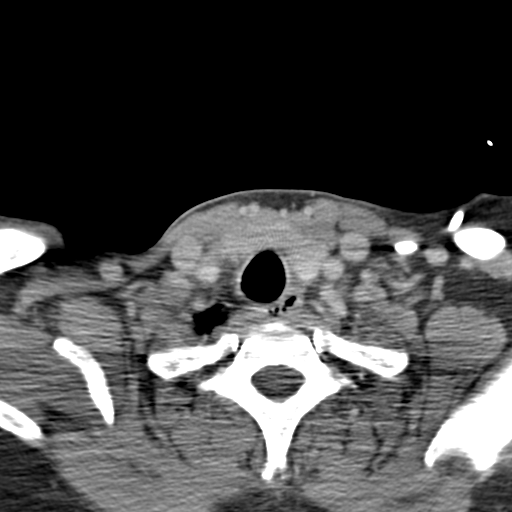
[im 31/123  bone]
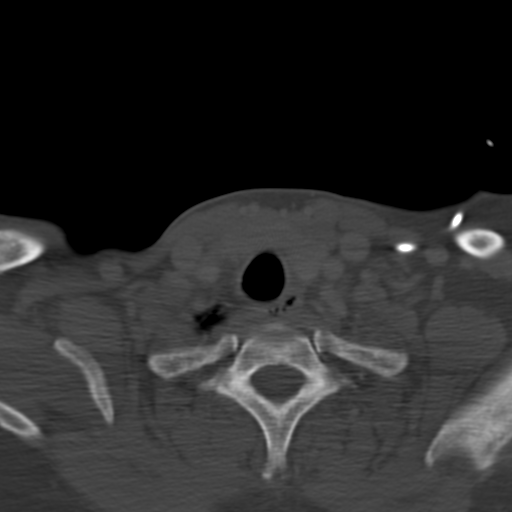
[im 62/123  bone]
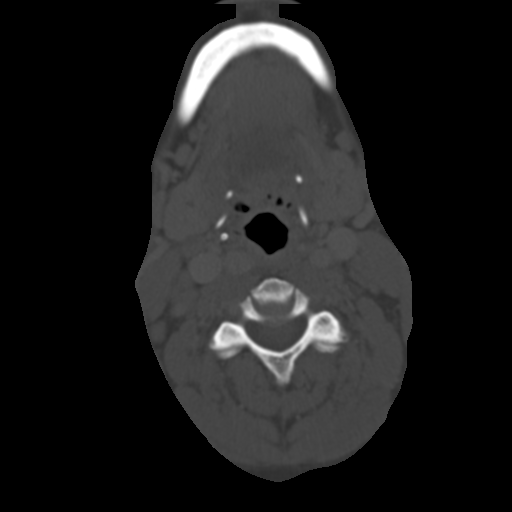
[im 92/123  bone]
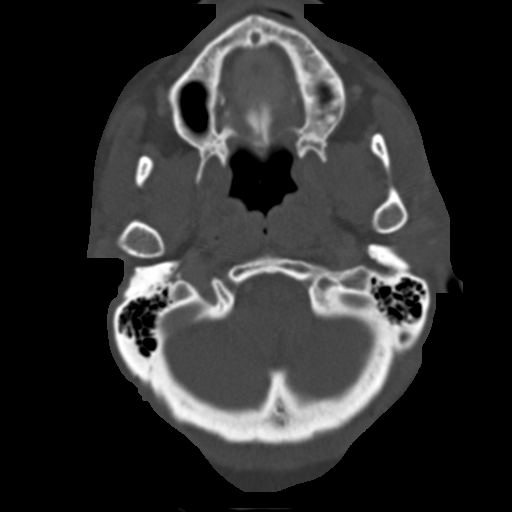

[Series 602: <mpr thick range> · sagittal · 0.74mm/px · 5 of 109 slices shown]
[im 19/109  bone]
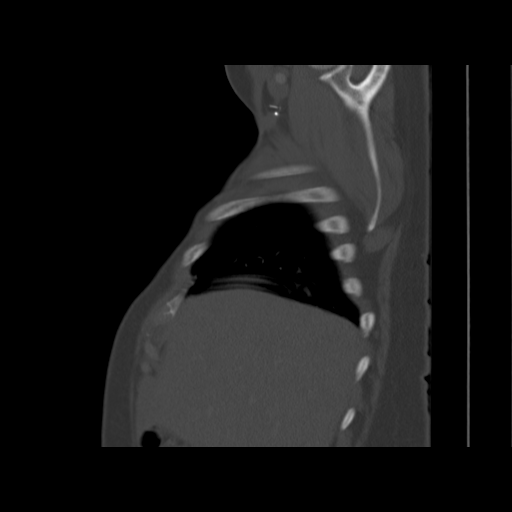
[im 37/109  bone]
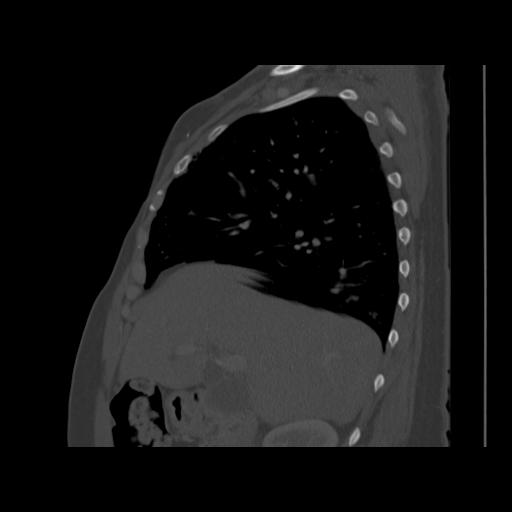
[im 55/109  bone]
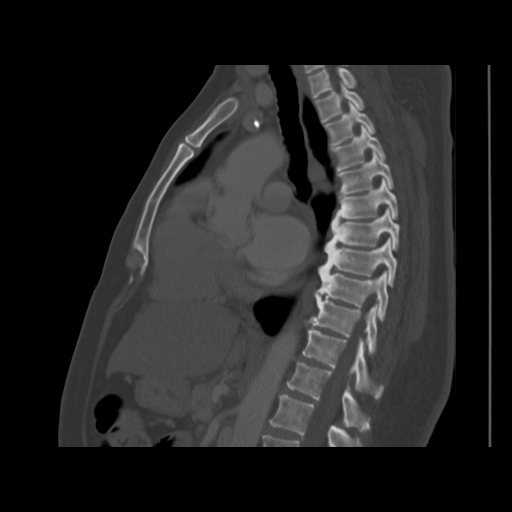
[im 73/109  bone]
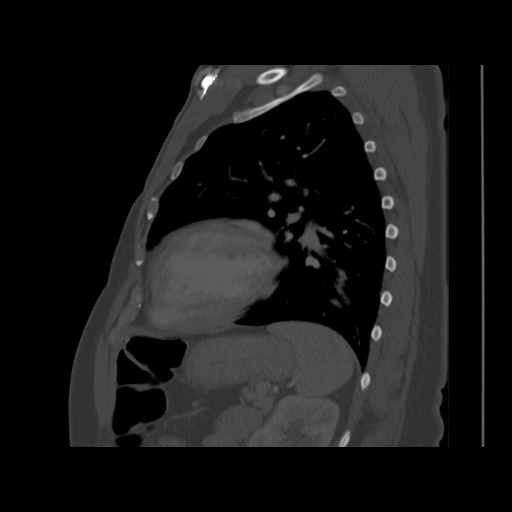
[im 91/109  bone]
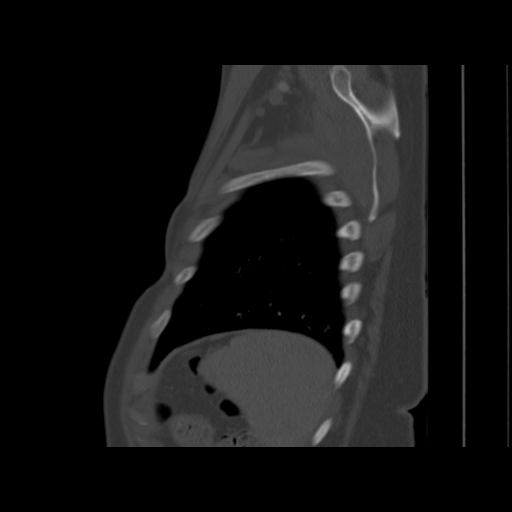

[Series 603: <mpr thick range(1)> · coronal · 0.74mm/px · 1 of 109 slices shown]
[im 55/109  bone]
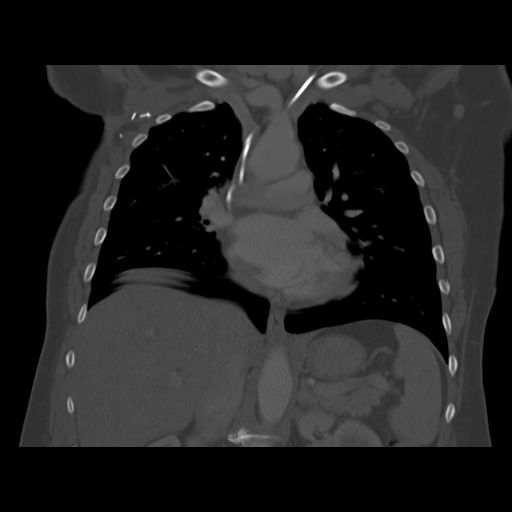

[9 of 33 positions shown; findings below may reference images not displayed]

FINDINGS: Limited imaging of the brain is unremarkable. Mild prominence of
level 2 lymph nodes is stable. These are ovoid and appear benign. No
pathologic nodes are evident.

No focal mucosal or submucosal lesions are present. The soft tissues
of the neck are otherwise unremarkable.

Bone windows are within normal limits.

The lung apices are clear.
IMPRESSION: 1. Mild prominence of level 2 lymph nodes is stable from the prior
study. These were not positive on the prior or current PET scan and
are likely reactive and chronic.
2. No other evidence for metastatic disease in the neck.

## 2014-07-17 ENCOUNTER — Other Ambulatory Visit: Payer: Self-pay | Admitting: Oncology

## 2014-07-17 DIAGNOSIS — C50919 Malignant neoplasm of unspecified site of unspecified female breast: Secondary | ICD-10-CM

## 2014-08-14 ENCOUNTER — Ambulatory Visit (HOSPITAL_COMMUNITY)
Admission: RE | Admit: 2014-08-14 | Discharge: 2014-08-14 | Disposition: A | Discharge status: Home / Self Care | Payer: Commercial Managed Care - PPO | Source: Ambulatory Visit | Attending: Oncology | Admitting: Oncology

## 2014-08-14 ENCOUNTER — Other Ambulatory Visit: Payer: Self-pay | Admitting: *Deleted

## 2014-08-14 ENCOUNTER — Emergency Department (HOSPITAL_COMMUNITY)
Admission: EM | Admit: 2014-08-14 | Discharge: 2014-08-14 | Discharge status: Against Medical Advice | Payer: Commercial Managed Care - PPO | Attending: Emergency Medicine | Admitting: Emergency Medicine

## 2014-08-14 ENCOUNTER — Encounter (HOSPITAL_COMMUNITY): Payer: Self-pay | Admitting: Emergency Medicine

## 2014-08-14 DIAGNOSIS — Z720 Tobacco use: Secondary | ICD-10-CM | POA: Insufficient documentation

## 2014-08-14 DIAGNOSIS — I82401 Acute embolism and thrombosis of unspecified deep veins of right lower extremity: Secondary | ICD-10-CM | POA: Diagnosis not present

## 2014-08-14 DIAGNOSIS — C50211 Malignant neoplasm of upper-inner quadrant of right female breast: Secondary | ICD-10-CM

## 2014-08-14 DIAGNOSIS — I429 Cardiomyopathy, unspecified: Secondary | ICD-10-CM

## 2014-08-14 DIAGNOSIS — R0789 Other chest pain: Secondary | ICD-10-CM

## 2014-08-14 DIAGNOSIS — M79609 Pain in unspecified limb: Secondary | ICD-10-CM

## 2014-08-14 DIAGNOSIS — I10 Essential (primary) hypertension: Secondary | ICD-10-CM | POA: Diagnosis not present

## 2014-08-14 DIAGNOSIS — M79603 Pain in arm, unspecified: Secondary | ICD-10-CM | POA: Diagnosis not present

## 2014-08-14 LAB — CBC WITH DIFFERENTIAL/PLATELET
Basophils Absolute: 0 10*3/uL (ref 0.0–0.1)
Basophils Relative: 0 % (ref 0–1)
Eosinophils Absolute: 0.2 10*3/uL (ref 0.0–0.7)
Eosinophils Relative: 4 % (ref 0–5)
HCT: 40.2 % (ref 36.0–46.0)
Hemoglobin: 13.5 g/dL (ref 12.0–15.0)
Lymphocytes Relative: 46 % (ref 12–46)
Lymphs Abs: 2.6 10*3/uL (ref 0.7–4.0)
MCH: 28.7 pg (ref 26.0–34.0)
MCHC: 33.6 g/dL (ref 30.0–36.0)
MCV: 85.5 fL (ref 78.0–100.0)
Monocytes Absolute: 0.4 10*3/uL (ref 0.1–1.0)
Monocytes Relative: 7 % (ref 3–12)
Neutro Abs: 2.5 10*3/uL (ref 1.7–7.7)
Neutrophils Relative %: 43 % (ref 43–77)
Platelets: 149 10*3/uL — ABNORMAL LOW (ref 150–400)
RBC: 4.7 MIL/uL (ref 3.87–5.11)
RDW: 13.3 % (ref 11.5–15.5)
WBC: 5.8 10*3/uL (ref 4.0–10.5)

## 2014-08-14 LAB — COMPREHENSIVE METABOLIC PANEL
ALT: 35 U/L (ref 0–35)
AST: 47 U/L — ABNORMAL HIGH (ref 0–37)
Albumin: 3.8 g/dL (ref 3.5–5.2)
Alkaline Phosphatase: 159 U/L — ABNORMAL HIGH (ref 39–117)
Anion gap: 12 (ref 5–15)
BUN: 16 mg/dL (ref 6–23)
CO2: 28 mEq/L (ref 19–32)
Calcium: 9.5 mg/dL (ref 8.4–10.5)
Chloride: 100 mEq/L (ref 96–112)
Creatinine, Ser: 0.78 mg/dL (ref 0.50–1.10)
GFR calc Af Amer: >90 mL/min (ref >90)
GFR calc non Af Amer: 90 mL/min — ABNORMAL LOW (ref >90)
Glucose, Bld: 101 mg/dL — ABNORMAL HIGH (ref 70–99)
Potassium: 3.8 mEq/L (ref 3.7–5.3)
Sodium: 140 mEq/L (ref 137–147)
Total Bilirubin: <0.2 mg/dL — ABNORMAL LOW (ref 0.3–1.2)
Total Protein: 7.6 g/dL (ref 6.0–8.3)

## 2014-08-14 LAB — TROPONIN I: Troponin I: <0.3 ng/mL (ref <0.30)

## 2014-08-14 MED ORDER — HEPARIN SOD (PORK) LOCK FLUSH 100 UNIT/ML IV SOLN
500.0000 [IU] | INTRAVENOUS | Status: AC | PRN
  Administered 2014-08-14: 500 [IU]

## 2014-08-14 NOTE — ED Notes (Addendum)
IV team notified to come remove port- pt wants to leave. Pt encouraged to stay but she still wants to leave.

## 2014-08-14 NOTE — Progress Notes (Signed)
[name] called to this RN to state she was seen last night in the ER.   " because I was having left arm pain and they did any EKG or something but then I left because there were 6 more people and it was 330am - I need an appointment to Dr Haroldine Laws but I don't have his number ".  This RN reviewed ER note

## 2014-08-14 NOTE — ED Notes (Signed)
IV team paged to come access port and draw labs.

## 2014-08-14 NOTE — Progress Notes (Signed)
*  Preliminary Results* Left upper extremity venous duplex completed. Left upper extremity is negative for deep and superficial vein thrombosis.  08/14/2014 1:18 PM  Maudry Mayhew, RVT, RDCS, RDMS

## 2014-08-14 NOTE — ED Notes (Signed)
The pt has a power port.  She will need lab work drawn in the treatment room

## 2014-08-14 NOTE — ED Notes (Signed)
The pt has had lt arm pain for 3 weeks wth radiation up into her lt neck.  She has had  Bi-lateral mastectomies and bi-lateral lymph node removal 2004 and 2006.

## 2014-08-15 ENCOUNTER — Telehealth: Payer: Self-pay | Admitting: Oncology

## 2014-08-15 NOTE — Telephone Encounter (Signed)
Lft msg on hp/cp for pt confirming referral apt w/Cardiologist. S/w [place] at [facility], sch pt for 10/12 with N/P due to pt's est MD is out and referral is urgent. I advised pt if she had any questions to please call our office or the [facility]......Marland Kitchen KJ

## 2014-08-17 NOTE — Progress Notes (Signed)
Patient ID: [name], female   DOB: [birth year], 58 y.o.   MRN: [id number]  Oncologist: [name]  PCP: Dr Royden Purl Surgeon: Dr Barry Dienes   HPI:   [name] is a 58 y.o. woman with a history of HTN, rheumatic fever, stage IV breast cancer. Followed in the cardio-oncology program.   She was initially diagnosed with breast cancer 2003 and had recurrence in 2005. In 2003 she underwent right lumpectomy and sentinel lymph node sampling of 2 separate foci of invasive ductal carcinoma, mpT2 pN1 or stage IIB, both foci grade 3, both ER/PR positive, both Her 2 neu +, Mib-1 56. Then underwent right mastectomy 07/13/2004 and S/P left mastectomy 02/13/2006. Also had DVT RUE 2012- on coumadin for 1 year.   Between May and September 2005 received dose dense doxorubicin/cyclophosphamide x 4 given with Herceptin, followed by weekly docetaxel x 8, again given with Herceptin. Carboplatin/gemcitabine/Herceptin given for 2 cycles, February and March 2013. Navelbine given together with Herceptin between July 2012 and January 2013. 14) TDM-1 (Kadcyla) started 02/2012  She returns for an urgent work visit due to chest pain.  She has not had breast cancer treatment since November 2014. She [facility] to [facility] ED on 10/8 and left before she  was examined by MD because she had to long to wait. She did have LUE doppler that was negative for DVT. CEs were als o negative. Today she denies left arm pain. Does admit to mild L arm discomfort when she lifts arm laterally. She denies chest pain. Denies SOB/PND/Orthopnea. Able to walk around the store without difficulty. Taking all medications.     ECHOs  05/08/12 EF 55-60%  09/10/2013 EF 55-65%  12/11/12 EF 55-60%  04/23/13 EF 55-60%  Lateral S' from main peak.  9/14 EF 55-60%, lateral s' 9.6 Global Strain - 21.8 10/14/13 EF 55-60% lateral S' 9.2 Strain -17.8 08/14/2014 K 3.8 Creatinine 0.78 CEs negative.   Labs  10/02/13 K 3.5 Creatinine 0.71  Labs 08/14/14 K 3.8 Creatinine 0.78 CE  negative  SH: Lives with husband in [place]. Has 4 children.   ROS: All systems negative except as listed in HPI, PMH and Problem List.  Past Medical History  Diagnosis Date  . DVT (deep venous thrombosis) 10/07/2011  . Breast cancer   . Hypertension   . Rheumatic fever     Current Outpatient Prescriptions  Medication Sig Dispense Refill  . alprazolam (XANAX) 2 MG tablet Take 2 mg by mouth daily.       . Ascorbic Acid (VITAMIN C) 100 MG tablet Take 100 mg by mouth daily.        . calcium-vitamin D (OSCAL WITH D) 500-200 MG-UNIT per tablet Take 1 tablet by mouth daily.        Marland Kitchen gabapentin (NEURONTIN) 100 MG capsule TAKE 1 CAPSULE BY MOUTH AT BEDTIME  30 capsule  1  . Multiple Vitamin (MULTIVITAMIN) capsule Take 1 capsule by mouth daily.        Marland Kitchen oxyCODONE-acetaminophen (PERCOCET) 10-325 MG per tablet Take 1 tablet by mouth every 6 (six) hours as needed.       . triamterene-hydrochlorothiazide (DYAZIDE) 37.5-25 MG per capsule TAKE 1 EACH (1 CAPSULE TOTAL) BY MOUTH EVERY MORNING.  30 capsule  2   No current facility-administered medications for this encounter.   Facility-Administered Medications Ordered in Other Encounters  Medication Dose Route Frequency Provider Last Rate Last Dose  . sodium chloride 0.9 % injection 10 mL  10 mL Intravenous PRN [name]  [name], MD   10 mL at 03/04/14 1421     PHYSICAL EXAM: Filed Vitals:   08/18/14 1046  BP: 107/75  Pulse: 74  Resp: 18  Weight: 185 lb 4 oz (84.029 kg)  SpO2: 99%    General:  Well appearing. No resp difficulty. Ambualted in the clinic without difficulty.  HEENT: normal Neck: supple. JVP flat. Carotids 2+ bilaterally; no bruits. No lymphadenopathy or thryomegaly appreciated. Cor: PMI normal. Regular rate & rhythm. No rubs, gallops or murmurs. Lungs: clear Abdomen: soft, nontender, nondistended. No hepatosplenomegaly. No bruits or masses. Good bowel sounds. Extremities: no cyanosis, clubbing, rash, edema. RUE lymph  edema sleeve.  Neuro: alert & orientedx3, cranial nerves grossly intact. Moves all 4 extremities w/o difficulty. Affect pleasant.  ASSESSMENT & PLAN: 1. Breast Cancer S/P right lumpectomy and sentinel lymph node sampling 03/04/2002 for 2 separate foci of invasive ductal carcinoma, mpT2 pN1 or stage IIB, both foci grade 3, both estrogen and progesterone receptor positive, both HercepTest 3+, Mib-1 56%. . Stage IV Breast Cancer.  She has been off treatment since November 2014. She was supposed to restart chemo January however she declined treatments.  Will need another ECHO if she restarts chemo.   2. HTN -Stable  continue HCTZ every Mon-Wed Fri. 3. L arm pain- Resolved.  Evaluated in [facility] ED 08/14/14 Ultra sound negative for DVT.  Offered stress test however she declined.   Follow up as needed or if she starts chemo again. If she starts chemo again she will need an ECHO. Masaru Chamberlin NP-C 08/18/2014 10:51 AM

## 2014-08-18 ENCOUNTER — Ambulatory Visit (HOSPITAL_COMMUNITY)
Admission: RE | Admit: 2014-08-18 | Discharge: 2014-08-18 | Disposition: A | Discharge status: Home / Self Care | Payer: Commercial Managed Care - PPO | Source: Ambulatory Visit | Attending: Cardiology | Admitting: Cardiology

## 2014-08-18 ENCOUNTER — Other Ambulatory Visit: Payer: Self-pay | Admitting: *Deleted

## 2014-08-18 ENCOUNTER — Encounter (HOSPITAL_COMMUNITY): Payer: Self-pay

## 2014-08-18 VITALS — BP 107/75 | HR 74 | Resp 18 | Wt 185.2 lb

## 2014-08-18 DIAGNOSIS — C50911 Malignant neoplasm of unspecified site of right female breast: Secondary | ICD-10-CM

## 2014-08-18 DIAGNOSIS — M79602 Pain in left arm: Secondary | ICD-10-CM | POA: Insufficient documentation

## 2014-08-18 DIAGNOSIS — Z86718 Personal history of other venous thrombosis and embolism: Secondary | ICD-10-CM | POA: Insufficient documentation

## 2014-08-18 DIAGNOSIS — Z9013 Acquired absence of bilateral breasts and nipples: Secondary | ICD-10-CM | POA: Diagnosis not present

## 2014-08-18 DIAGNOSIS — C50211 Malignant neoplasm of upper-inner quadrant of right female breast: Secondary | ICD-10-CM

## 2014-08-18 DIAGNOSIS — Z853 Personal history of malignant neoplasm of breast: Secondary | ICD-10-CM | POA: Diagnosis not present

## 2014-08-18 DIAGNOSIS — I10 Essential (primary) hypertension: Secondary | ICD-10-CM | POA: Insufficient documentation

## 2014-08-18 DIAGNOSIS — R079 Chest pain, unspecified: Secondary | ICD-10-CM | POA: Diagnosis not present

## 2014-08-18 DIAGNOSIS — Z79899 Other long term (current) drug therapy: Secondary | ICD-10-CM | POA: Diagnosis not present

## 2014-08-18 MED ORDER — TRIAMTERENE-HCTZ 37.5-25 MG PO CAPS: ORAL_CAPSULE | ORAL | Status: DC

## 2014-08-18 NOTE — Patient Instructions (Signed)
Follow up as needed

## 2014-08-18 NOTE — Telephone Encounter (Signed)
1 

## 2014-08-21 ENCOUNTER — Ambulatory Visit: Payer: Commercial Managed Care - PPO

## 2014-08-21 ENCOUNTER — Other Ambulatory Visit (HOSPITAL_BASED_OUTPATIENT_CLINIC_OR_DEPARTMENT_OTHER): Payer: Commercial Managed Care - PPO

## 2014-08-21 ENCOUNTER — Ambulatory Visit (HOSPITAL_COMMUNITY)
Admission: RE | Admit: 2014-08-21 | Discharge: 2014-08-21 | Disposition: A | Discharge status: Home / Self Care | Payer: Commercial Managed Care - PPO | Source: Ambulatory Visit | Attending: Oncology | Admitting: Oncology

## 2014-08-21 DIAGNOSIS — I890 Lymphedema, not elsewhere classified: Secondary | ICD-10-CM

## 2014-08-21 DIAGNOSIS — C50211 Malignant neoplasm of upper-inner quadrant of right female breast: Secondary | ICD-10-CM

## 2014-08-21 DIAGNOSIS — C50919 Malignant neoplasm of unspecified site of unspecified female breast: Secondary | ICD-10-CM | POA: Insufficient documentation

## 2014-08-21 DIAGNOSIS — I82409 Acute embolism and thrombosis of unspecified deep veins of unspecified lower extremity: Secondary | ICD-10-CM

## 2014-08-21 DIAGNOSIS — Z95828 Presence of other vascular implants and grafts: Secondary | ICD-10-CM

## 2014-08-21 LAB — COMPREHENSIVE METABOLIC PANEL (CC13)
ALT: 37 U/L (ref 0–55)
AST: 38 U/L — ABNORMAL HIGH (ref 5–34)
Albumin: 4 g/dL (ref 3.5–5.0)
Alkaline Phosphatase: 169 U/L — ABNORMAL HIGH (ref 40–150)
Anion Gap: 10 mEq/L (ref 3–11)
BUN: 15.2 mg/dL (ref 7.0–26.0)
CO2: 27 mEq/L (ref 22–29)
Calcium: 10.1 mg/dL (ref 8.4–10.4)
Chloride: 103 mEq/L (ref 98–109)
Creatinine: 1.1 mg/dL (ref 0.6–1.1)
Glucose: 125 mg/dl (ref 70–140)
Potassium: 3.8 mEq/L (ref 3.5–5.1)
Sodium: 140 mEq/L (ref 136–145)
Total Bilirubin: 0.37 mg/dL (ref 0.20–1.20)
Total Protein: 7.6 g/dL (ref 6.4–8.3)

## 2014-08-21 LAB — CBC WITH DIFFERENTIAL/PLATELET
BASO%: 1.2 % (ref 0.0–2.0)
Basophils Absolute: 0.1 10*3/uL (ref 0.0–0.1)
EOS%: 2.5 % (ref 0.0–7.0)
Eosinophils Absolute: 0.2 10*3/uL (ref 0.0–0.5)
HCT: 44 % (ref 34.8–46.6)
HGB: 14 g/dL (ref 11.6–15.9)
LYMPH%: 28.2 % (ref 14.0–49.7)
MCH: 28 pg (ref 25.1–34.0)
MCHC: 31.9 g/dL (ref 31.5–36.0)
MCV: 87.6 fL (ref 79.5–101.0)
MONO#: 0.5 10*3/uL (ref 0.1–0.9)
MONO%: 6.9 % (ref 0.0–14.0)
NEUT#: 4.1 10*3/uL (ref 1.5–6.5)
NEUT%: 61.2 % (ref 38.4–76.8)
Platelets: 169 10*3/uL (ref 145–400)
RBC: 5.02 10*6/uL (ref 3.70–5.45)
RDW: 13.7 % (ref 11.2–14.5)
WBC: 6.7 10*3/uL (ref 3.9–10.3)
lymph#: 1.9 10*3/uL (ref 0.9–3.3)

## 2014-08-21 MED ORDER — HEPARIN SOD (PORK) LOCK FLUSH 100 UNIT/ML IV SOLN
500.0000 [IU] | Freq: Once | INTRAVENOUS | Status: AC
  Administered 2014-08-21: 500 [IU] via INTRAVENOUS
  Filled 2014-08-21: qty 5

## 2014-08-21 MED ORDER — SODIUM CHLORIDE 0.9 % IJ SOLN
10.0000 mL | INTRAMUSCULAR | Status: DC | PRN
  Administered 2014-08-21: 10 mL via INTRAVENOUS
  Filled 2014-08-21: qty 10

## 2014-08-21 NOTE — Patient Instructions (Signed)

## 2014-08-22 LAB — CANCER ANTIGEN 27.29: CA 27.29: 28 U/mL (ref 0–39)

## 2014-08-25 ENCOUNTER — Telehealth: Payer: Self-pay | Admitting: Oncology

## 2014-08-25 ENCOUNTER — Ambulatory Visit (HOSPITAL_BASED_OUTPATIENT_CLINIC_OR_DEPARTMENT_OTHER): Payer: Commercial Managed Care - PPO | Admitting: Oncology

## 2014-08-25 VITALS — BP 139/96 | HR 75 | Temp 98.6°F | Resp 18 | Ht 66.0 in | Wt 181.3 lb

## 2014-08-25 DIAGNOSIS — Z853 Personal history of malignant neoplasm of breast: Secondary | ICD-10-CM

## 2014-08-25 DIAGNOSIS — C773 Secondary and unspecified malignant neoplasm of axilla and upper limb lymph nodes: Secondary | ICD-10-CM

## 2014-08-25 DIAGNOSIS — C770 Secondary and unspecified malignant neoplasm of lymph nodes of head, face and neck: Secondary | ICD-10-CM

## 2014-08-25 DIAGNOSIS — C50211 Malignant neoplasm of upper-inner quadrant of right female breast: Secondary | ICD-10-CM

## 2014-08-25 DIAGNOSIS — Z86718 Personal history of other venous thrombosis and embolism: Secondary | ICD-10-CM

## 2014-08-25 DIAGNOSIS — I890 Lymphedema, not elsewhere classified: Secondary | ICD-10-CM

## 2014-08-25 NOTE — Progress Notes (Signed)
[facility]  [phone] Fax:([phone]) [phone]  OFFICE PROGRESS NOTE   ID: [name]   DOB: 1956-06-27  MR#: [id number]  POE#:[id number]   PCP: [name], MD [name], [name], [name] RAD [id number] [name], [name] OTHER:  [name], MD  CHIEF COMPLAINT:  Stage IV Breast Cancer CURRENT TREATMENT: Observation   BREAST CANCER HISTORY: From the original intake note:  The patient originally presented in May 2003 when she noticed a lump in the upper inner quadrant of her right breast in September 2003.  She sought attention and had a mammogram which showed an obvious carcinoma in the upper inner quadrant of the right breast, approximately 2 cm.  There were some enlarged lymph nodes in the axilla and an FNA done showed those consistent with malignant cells, most likely an invasive ductal carcinoma.  At that point she was unsure of what to do and was referred by Dr. [name] for a discussion of her treatment options.  By biopsy, it was ER/PR negative and HER-2 was 3+.  DNA index was 1.42.  We reiterated that it was most important to have her disease surgically addressed at that point and had recommended lumpectomy and axillary nodes to be addressed with which she followed through and on 04-03-02, Dr. [name] performed a right partial mastectomy and a right axillary lymph node dissection.  Final pathology revealed a 2.4 cm. high grade, Grade III invasive ductal carcinoma with an adjacent .8 cm. also high grade invasive ductal carcinoma which was felt to represent an intramammary metastasis rather than a second primary.  A smaller mass was just medial to the larger mass.  The smaller mass was associated with high grade DCIS component.  There was no definite lymphovascular invasion identified.  However, one of fourteen axillary lymph nodes did contain a 1.5 cm. metastatic deposit.  Postoperatively she did very well.  We reiterated the fact that she  did need adjuvant chemotherapy, however, she refused and decided that she would pursue radiation.  She received radiation and completed that on 08-14-02. Her subsequent history is as detailed below.   INTERVAL HISTORY: [name] returns today for followup of her breast cancer. 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 She doesn't mind some babysitting but this is more than she would like.  REVIEW OF SYSTEMS: She is having pain in her left arm. She is concerned because the pain before was in the right arm. She does not understand why she shouldn't hurt in her left arm at this point. She has not injured it or done any unusual activities that she is aware of. Aside from this there have been no unusual headaches, visual changes, nausea, vomiting, dizziness, gait imbalance, cough, phlegm production, pleurisy, or change in bowel or bladder habits. There has been no rash, bleeding, or fever. A detailed review of systems today was otherwise stable.  PAST MEDICAL HISTORY: Past Medical History  Diagnosis Date  . DVT (deep venous thrombosis) 10/07/2011  . Breast cancer   . Hypertension   . Rheumatic fever     PAST SURGICAL HISTORY: Past Surgical History  Procedure Laterality Date  . Mastectomy      bilateral  . Breast surgery    . Port a  cath revision      FAMILY HISTORY Family History  Problem Relation Age of Onset  . Pancreatic cancer Mother   . Kidney cancer Sister 81  . Breast cancer Sister 77  . Breast cancer Other 31  The patient's father died in his 76L  from complications of alcohol abuse. The patient's mother died in her 10s from pancreatic cancer. The patient has 6 brothers, 2 sisters. Both her sisters have had breast cancer, both diagnosed after the age of 79. The  patient has not had genetic testing so far.   GYNECOLOGIC HISTORY: Menarche age 12; first live birth age 23. She is GXP4. She is not sure when she stopped having periods. She never used hormone replacement.   SOCIAL HISTORY: The patient has worked in the past as a Psychologist, counselling. At home in addition to the patient are her husband [name], originally from [place], who works as a Administrator.  Also living in the home is her son [name] and her daughter [name] (she is studying to be a Marine scientist).  The other 2 children are [name], who has a college degree in psychology and works as a Probation officer; and [name], who lives in [place] and works as a Higher education careers adviser.   ADVANCED DIRECTIVES: Not in place  HEALTH MAINTENANCE: History  Substance Use Topics  . Smoking status: Light Tobacco Smoker  . Smokeless tobacco: Never Used  . Alcohol Use: Yes     Comment: Rarely     Colonoscopy:  Not on file  PAP:  Not on file  Bone density:  Not on file  Lipid panel:  Not on file    Allergies  Allergen Reactions  . Tramadol Nausea Only  . Hydrocodone-Acetaminophen Itching and Rash    Current Outpatient Prescriptions  Medication Sig Dispense Refill  . alprazolam (XANAX) 2 MG tablet Take 2 mg by mouth daily.       . Ascorbic Acid (VITAMIN C) 100 MG tablet Take 100 mg by mouth daily.        . calcium-vitamin D (OSCAL WITH D) 500-200 MG-UNIT per tablet Take 1 tablet by mouth daily.        Marland Kitchen gabapentin (NEURONTIN) 100 MG capsule TAKE 1 CAPSULE BY MOUTH AT BEDTIME  30 capsule  1  . Multiple Vitamin (MULTIVITAMIN) capsule Take 1 capsule by mouth daily.        Marland Kitchen oxyCODONE-acetaminophen (PERCOCET) 10-325 MG per tablet Take 1 tablet by mouth every 6 (six) hours as needed.       . triamterene-hydrochlorothiazide (DYAZIDE) 37.5-25 MG per capsule TAKE 1 EACH (1 CAPSULE TOTAL) BY MOUTH EVERY MORNING.  30 capsule  0   No current facility-administered medications for this visit.   Facility-Administered Medications  Ordered in Other Visits  Medication Dose Route Frequency Provider Last Rate Last Dose  . sodium chloride 0.9 % injection 10 mL  10 mL Intravenous PRN Chauncey Cruel, MD   10 mL at 03/04/14 1421    OBJECTIVE: Middle-aged [place] American woman who appears stated age 14 Vitals:   08/25/14 1156  BP: 139/96  Pulse: 75  Temp: 98.6 F (37 C)  Resp: 18     Body mass index is 29.28 kg/(m^2).    ECOG FS: 1 Filed Weights   08/25/14 1156  Weight: 181 lb 4.8 oz (82.237 kg)   Sclerae unicteric, pupils equal and reactive Oropharynx clear and moist No cervical or supraclavicular adenopathy Lungs no rales or rhonchi Heart regular rate and rhythm Abd soft, nontender,  positive bowel sounds MSK no focal spinal tenderness, no upper extremity lymphedema Neuro: nonfocal, well oriented, appropriate affect Breasts: status post bilateral mastectomies. There is no evidence of chest wall recurrence. Both axillae are benign.  LAB RESULTS:. Results for KIESHA, ENSEY (MRN [id number]) as of 08/25/2014 14:01  Ref. Range 04/26/2012 10:55 05/17/2012 09:49 10/05/2012 14:28 05/29/2014 13:17 08/21/2014 13:43  CA 27.29 Latest Range: 0-39 U/mL 45 (H) 54 (H) 37 29 28   Lab Results  Component Value Date   WBC 6.7 08/21/2014   NEUTROABS 4.1 08/21/2014   HGB 14.0 08/21/2014   HCT 44.0 08/21/2014   MCV 87.6 08/21/2014   PLT 169 08/21/2014      Chemistry      Component Value Date/Time   NA 140 08/21/2014 1343   NA 140 08/14/2014 0100   K 3.8 08/21/2014 1343   K 3.8 08/14/2014 0100   CL 100 08/14/2014 0100   CL 101 03/07/2013 1411   CO2 27 08/21/2014 1343   CO2 28 08/14/2014 0100   BUN 15.2 08/21/2014 1343   BUN 16 08/14/2014 0100   CREATININE 1.1 08/21/2014 1343   CREATININE 0.78 08/14/2014 0100      Component Value Date/Time   CALCIUM 10.1 08/21/2014 1343   CALCIUM 9.5 08/14/2014 0100   ALKPHOS 169* 08/21/2014 1343   ALKPHOS 159* 08/14/2014 0100   AST 38* 08/21/2014 1343   AST 47* 08/14/2014 0100   ALT  37 08/21/2014 1343   ALT 35 08/14/2014 0100   BILITOT 0.37 08/21/2014 1343   BILITOT <0.2* 08/14/2014 0100       STUDIES: Dg Chest 2 View  08/21/2014   CLINICAL DATA:  Breast cancer, unspecified laterality. Evaluate for metastatic disease.  EXAM: CHEST  2 VIEW  COMPARISON:  CT chest 02/25/2014.  FINDINGS: Trachea is midline. Heart size normal. Left subclavian power port tip is in the SVC or at the SVC RA junction. Lungs are clear. No pleural fluid. Surgical clips in the right axillary region. Degenerative changes are seen in the spine.  IMPRESSION: No acute findings.  No radiographic evidence of metastatic disease.   Electronically Signed   By: Lorin Picket [name]   On: 08/21/2014 15:11    ASSESSMENT:  58 y.o. [place] woman with stage IV breast cancer manifested chiefly by loco-regional nodal disease (neck, chest), without lung, liver, bone, or brain involvement.  (1) Status post right lumpectomy and sentinel lymph node sampling 03/04/2002 for 2 separate foci of invasive ductal carcinoma, mpT2 pN1 or stage IIB, both foci grade 3, both estrogen and progesterone receptor positive, both HercepTest 3+, Mib-1 56%  (2) Reexcision for margins 05/27/2002 showed no residual cancer in the breast.  (3) The patient refused adjuvant systemic therapy.  (4) Adjuvant radiation treatment completed 08/14/2002.  (5) recurrence in the right breast in 02/2004 showing a morphologically different tumor, again grade 3, again estrogen and progesterone receptor negative, with an MIB-1 of 14% and Herceptest 3+.  (5) Between 03/2004 and 07/2004 she received dose dense Doxorubicin/Cyclophosphamide x 4 given with trastuzumab, followed by weekly Docetaxel x 8, again given with trastuzumab.  (6) Right mastectomy 07/13/2004 showed scattered microscopic foci of residual disease over an area  greater than 5 cm. Margins were negative.  (7) Postoperative Docetaxel continued until 09/2004.  (8) Trastuzumab (Herceptin)  given 08/2004 through 01/2012 with some brief interruptions.  (9) Isolated right cervical nodal recurrence 10/2005, treated with radiation to the right supraclavicular area (total 41.5 gray) completed 12/29/2005.  (10) Navelbine given together  with Herceptin  11/2005 through 03/2006.  (9) Left mastectomy 02/13/2006 for ductal carcinoma in situ, grade 2, estrogen and progesterone receptor negative, with negative margins; 0 of 3 lymph nodes involved  (10) treated with Lapatinib and Capecitabine before 10/2009, for an unclear duration and with unclear results (cannot locate data on chart review).  (11) Status post right supraclavicular lymph node biopsy 09/2010 again positive for an invasive ductal carcinoma, estrogen and progesterone receptor negative, HER-2 positive by CISH with a ratio 4.25.  (12) Navelbine given together with Herceptin between 05/2011 and 11/2011.  (13) Carboplatin/ Gemcitabine/ Herceptin given for 2 cycles, in 12/2011 and 01/2012.  (14) TDM-1 (Kadcyla) started 02/2012. Last dose 10/02/2013 after which the patient discontinued treatment at her own discretion. Most recent echo on December 2014 showed a well preserved ejection fraction.  (15) Deep vein thrombosis of the right upper extremity documented 04/20/2011.  She completed anticoagulant therapy with Coumadin on 03/25/2013.  (16) Chronic right upper extremity lymphedema.  (17) Right chest port-a-cath removal due to infection on 01/28/2013. Left chest Port-A-Cath placed on 04/08/2013; be flushed every 6 weeks   PLAN: Saquoia continues to do well on observation, with no evidence of disease activity clinically. She has some bursitis in the left shoulder and we discussed what she can do about that, mostly either use ice or heat preferably ice, given a little bit of a rest, and takes some ibuprofen as needed.  We're going to continue to flush her port every 6 weeks check her labs with each port flush and see her every 3  months with a chest x-ray. Of course if any other problems develop we would see her before that.  She has a good understanding of this plan. She agrees with it. She knows the goal of treatment in her case is control. She will call with any problems that may develop before the next visit.  Chauncey Cruel, MD  08/25/2014 11:58 AM

## 2014-08-25 NOTE — Telephone Encounter (Signed)
, °

## 2014-09-18 ENCOUNTER — Other Ambulatory Visit: Payer: Self-pay | Admitting: Oncology

## 2014-10-15 ENCOUNTER — Telehealth: Payer: Self-pay | Admitting: Nurse Practitioner

## 2014-10-15 NOTE — Telephone Encounter (Signed)
pt left vm-cld pt back-pt needed appt time & date-left appt on vm

## 2014-10-20 ENCOUNTER — Ambulatory Visit (HOSPITAL_BASED_OUTPATIENT_CLINIC_OR_DEPARTMENT_OTHER): Payer: Commercial Managed Care - PPO

## 2014-10-20 ENCOUNTER — Other Ambulatory Visit (HOSPITAL_BASED_OUTPATIENT_CLINIC_OR_DEPARTMENT_OTHER): Payer: Commercial Managed Care - PPO

## 2014-10-20 VITALS — BP 115/98 | HR 95 | Temp 98.4°F

## 2014-10-20 DIAGNOSIS — Z95828 Presence of other vascular implants and grafts: Secondary | ICD-10-CM

## 2014-10-20 DIAGNOSIS — I82409 Acute embolism and thrombosis of unspecified deep veins of unspecified lower extremity: Secondary | ICD-10-CM

## 2014-10-20 DIAGNOSIS — C50211 Malignant neoplasm of upper-inner quadrant of right female breast: Secondary | ICD-10-CM

## 2014-10-20 DIAGNOSIS — Z452 Encounter for adjustment and management of vascular access device: Secondary | ICD-10-CM

## 2014-10-20 DIAGNOSIS — I890 Lymphedema, not elsewhere classified: Secondary | ICD-10-CM

## 2014-10-20 LAB — CBC WITH DIFFERENTIAL/PLATELET
BASO%: 0.8 % (ref 0.0–2.0)
Basophils Absolute: 0 10*3/uL (ref 0.0–0.1)
EOS%: 2.7 % (ref 0.0–7.0)
Eosinophils Absolute: 0.2 10*3/uL (ref 0.0–0.5)
HCT: 42.1 % (ref 34.8–46.6)
HGB: 13.4 g/dL (ref 11.6–15.9)
LYMPH%: 21.6 % (ref 14.0–49.7)
MCH: 27.7 pg (ref 25.1–34.0)
MCHC: 31.8 g/dL (ref 31.5–36.0)
MCV: 87.2 fL (ref 79.5–101.0)
MONO#: 0.5 10*3/uL (ref 0.1–0.9)
MONO%: 7.6 % (ref 0.0–14.0)
NEUT#: 4.3 10*3/uL (ref 1.5–6.5)
NEUT%: 67.3 % (ref 38.4–76.8)
Platelets: 173 10*3/uL (ref 145–400)
RBC: 4.83 10*6/uL (ref 3.70–5.45)
RDW: 13.6 % (ref 11.2–14.5)
WBC: 6.5 10*3/uL (ref 3.9–10.3)
lymph#: 1.4 10*3/uL (ref 0.9–3.3)

## 2014-10-20 LAB — COMPREHENSIVE METABOLIC PANEL (CC13)
ALT: 34 U/L (ref 0–55)
AST: 36 U/L — ABNORMAL HIGH (ref 5–34)
Albumin: 3.8 g/dL (ref 3.5–5.0)
Alkaline Phosphatase: 166 U/L — ABNORMAL HIGH (ref 40–150)
Anion Gap: 11 mEq/L (ref 3–11)
BUN: 10.3 mg/dL (ref 7.0–26.0)
CO2: 27 mEq/L (ref 22–29)
Calcium: 9.9 mg/dL (ref 8.4–10.4)
Chloride: 100 mEq/L (ref 98–109)
Creatinine: 1 mg/dL (ref 0.6–1.1)
EGFR: 75 mL/min/{1.73_m2} — ABNORMAL LOW (ref >90)
Glucose: 142 mg/dl — ABNORMAL HIGH (ref 70–140)
Potassium: 3.9 mEq/L (ref 3.5–5.1)
Sodium: 138 mEq/L (ref 136–145)
Total Bilirubin: 0.35 mg/dL (ref 0.20–1.20)
Total Protein: 7.3 g/dL (ref 6.4–8.3)

## 2014-10-20 MED ORDER — HEPARIN SOD (PORK) LOCK FLUSH 100 UNIT/ML IV SOLN
500.0000 [IU] | Freq: Once | INTRAVENOUS | Status: AC
  Administered 2014-10-20: 500 [IU] via INTRAVENOUS
  Filled 2014-10-20: qty 5

## 2014-10-20 MED ORDER — SODIUM CHLORIDE 0.9 % IJ SOLN
10.0000 mL | INTRAMUSCULAR | Status: DC | PRN
  Administered 2014-10-20: 10 mL via INTRAVENOUS
  Filled 2014-10-20: qty 10

## 2014-10-20 NOTE — Patient Instructions (Signed)

## 2014-11-15 ENCOUNTER — Other Ambulatory Visit: Payer: Self-pay | Admitting: Oncology

## 2014-11-26 ENCOUNTER — Telehealth: Payer: Self-pay | Admitting: Oncology

## 2014-11-26 NOTE — Telephone Encounter (Signed)
, °

## 2014-12-08 ENCOUNTER — Ambulatory Visit (HOSPITAL_BASED_OUTPATIENT_CLINIC_OR_DEPARTMENT_OTHER): Payer: Commercial Managed Care - PPO

## 2014-12-08 ENCOUNTER — Other Ambulatory Visit: Payer: Commercial Managed Care - PPO

## 2014-12-08 ENCOUNTER — Ambulatory Visit (HOSPITAL_COMMUNITY)
Admission: RE | Admit: 2014-12-08 | Discharge: 2014-12-08 | Disposition: A | Discharge status: Home / Self Care | Payer: Commercial Managed Care - PPO | Source: Ambulatory Visit | Attending: Oncology | Admitting: Oncology

## 2014-12-08 ENCOUNTER — Encounter: Payer: Self-pay | Admitting: Oncology

## 2014-12-08 ENCOUNTER — Ambulatory Visit (HOSPITAL_BASED_OUTPATIENT_CLINIC_OR_DEPARTMENT_OTHER): Payer: Commercial Managed Care - PPO | Admitting: Oncology

## 2014-12-08 ENCOUNTER — Ambulatory Visit: Payer: Commercial Managed Care - PPO | Admitting: Nurse Practitioner

## 2014-12-08 ENCOUNTER — Encounter: Payer: Self-pay | Admitting: *Deleted

## 2014-12-08 ENCOUNTER — Telehealth: Payer: Self-pay | Admitting: Oncology

## 2014-12-08 VITALS — BP 102/66 | HR 74 | Temp 98.2°F | Resp 18 | Ht 66.0 in | Wt 180.2 lb

## 2014-12-08 DIAGNOSIS — I890 Lymphedema, not elsewhere classified: Secondary | ICD-10-CM

## 2014-12-08 DIAGNOSIS — R0989 Other specified symptoms and signs involving the circulatory and respiratory systems: Secondary | ICD-10-CM | POA: Diagnosis not present

## 2014-12-08 DIAGNOSIS — C7989 Secondary malignant neoplasm of other specified sites: Secondary | ICD-10-CM | POA: Insufficient documentation

## 2014-12-08 DIAGNOSIS — Z86718 Personal history of other venous thrombosis and embolism: Secondary | ICD-10-CM | POA: Diagnosis not present

## 2014-12-08 DIAGNOSIS — I82409 Acute embolism and thrombosis of unspecified deep veins of unspecified lower extremity: Secondary | ICD-10-CM

## 2014-12-08 DIAGNOSIS — C50211 Malignant neoplasm of upper-inner quadrant of right female breast: Secondary | ICD-10-CM

## 2014-12-08 DIAGNOSIS — C50911 Malignant neoplasm of unspecified site of right female breast: Secondary | ICD-10-CM | POA: Insufficient documentation

## 2014-12-08 DIAGNOSIS — I10 Essential (primary) hypertension: Secondary | ICD-10-CM | POA: Diagnosis not present

## 2014-12-08 DIAGNOSIS — Z95828 Presence of other vascular implants and grafts: Secondary | ICD-10-CM

## 2014-12-08 MED ORDER — SODIUM CHLORIDE 0.9 % IJ SOLN
10.0000 mL | INTRAMUSCULAR | Status: DC | PRN
  Administered 2014-12-08: 10 mL via INTRAVENOUS
  Filled 2014-12-08: qty 10

## 2014-12-08 MED ORDER — HEPARIN SOD (PORK) LOCK FLUSH 100 UNIT/ML IV SOLN
500.0000 [IU] | Freq: Once | INTRAVENOUS | Status: AC
  Administered 2014-12-08: 500 [IU] via INTRAVENOUS
  Filled 2014-12-08: qty 5

## 2014-12-08 NOTE — Progress Notes (Signed)
Spoke with patient, chest x-ray done today is stable. Patient verbalized understanding.

## 2014-12-08 NOTE — Progress Notes (Signed)
[place]  [phone] [phone]) [phone]  OFFICE PROGRESS NOTE   ID: [name]   DOB: [birth year]  MR#: [id number]  DTO#:[id number]   PCP: [name], MD [name], [name], [name] RAD XIP:[id number] [name], [name] OTHER:  [name], MD  CHIEF COMPLAINT:  Stage IV Breast Cancer CURRENT TREATMENT: Observation   BREAST CANCER HISTORY: From the original intake note:  The patient originally presented in May 2003 when she noticed a lump in the upper inner quadrant of her right breast in September 2003.  She sought attention and had a mammogram which showed an obvious carcinoma in the upper inner quadrant of the right breast, approximately 2 cm.  There were some enlarged lymph nodes in the axilla and an FNA done showed those consistent with malignant cells, most likely an invasive ductal carcinoma.  At that point she was unsure of what to do and was referred by Dr. [name] for a discussion of her treatment options.  By biopsy, it was ER/PR negative and HER-2 was 3+.  DNA index was 1.42.  We reiterated that it was most important to have her disease surgically addressed at that point and had recommended lumpectomy and axillary nodes to be addressed with which she followed through and on 04-03-02, Dr. [name] performed a right partial mastectomy and a right axillary lymph node dissection.  Final pathology revealed a 2.4 cm. high grade, Grade III invasive ductal carcinoma with an adjacent .8 cm. also high grade invasive ductal carcinoma which was felt to represent an intramammary metastasis rather than a second primary.  A smaller mass was just medial to the larger mass.  The smaller mass was associated with high grade DCIS component.  There was no definite lymphovascular invasion identified.  However, one of fourteen axillary lymph nodes did contain a 1.5 cm. metastatic deposit.  Postoperatively she did very well.  We reiterated the fact that she  did need adjuvant chemotherapy, however, she refused and decided that she would pursue radiation.  She received radiation and completed that on 08-14-02. Her subsequent history is as detailed below.   INTERVAL HISTORY: [name] returns today for followup of her breast cancer. Babysits her 60 months old grandchild intermittently. Remains active and feels well.  REVIEW OF SYSTEMS: The patient has been doing well since her last visit. She has remained active and overall feels well. There have not been any unusual headaches, visual changes, nausea, vomiting, dizziness, gait imbalance, cough, phlegm production, pleurisy, or change in bowel or bladder habits. There has been no rash, bleeding, or fever. A chest x-ray had been requested today with her visit, but the patient did not know about this and this has not yet been done. Patient is concerned about a chest x-ray due to risk of radiation exposure. He complains of increased lymphedema to her right arm and hand. States that her lymphedema sleeve and glove are not fitting well. Would like a referral back to the lymphedema clinic for reevaluation. A detailed review of systems today was otherwise stable.  PAST MEDICAL HISTORY: Past Medical History  Diagnosis Date  . DVT (deep venous thrombosis) 10/07/2011  . Breast cancer   . Hypertension   . Rheumatic fever     PAST SURGICAL HISTORY: Past Surgical History  Procedure Laterality Date  . Mastectomy      bilateral  . Breast surgery    . Port a cath revision      FAMILY HISTORY Family History  Problem Relation  Age of Onset  . Pancreatic cancer Mother   . Kidney cancer Sister 50  . Breast cancer Sister 32  . Breast cancer Other 60  The patient's father died in his 26R  from complications of alcohol abuse. The patient's mother died in her 75s from pancreatic cancer. The patient has 6 brothers, 2 sisters. Both her sisters have had breast cancer, both diagnosed after the age of 43. The patient has  not had genetic testing so far.   GYNECOLOGIC HISTORY: Menarche age 59; first live birth age 59. She is GXP4. She is not sure when she stopped having periods. She never used hormone replacement.   SOCIAL HISTORY: The patient has worked in the past as a Psychologist, counselling. At home in addition to the patient are her husband [name], originally from [place], who works as a Administrator.  Also living in the home is her son [name] and her daughter [name] (she is studying to be a Marine scientist).  The other 2 children are [name], who has a college degree in psychology and works as a Probation officer; and [name], who lives in [place] and works as a Higher education careers adviser.   ADVANCED DIRECTIVES: Not in place  HEALTH MAINTENANCE: History  Substance Use Topics  . Smoking status: Light Tobacco Smoker  . Smokeless tobacco: Never Used  . Alcohol Use: Yes     Comment: Rarely     Colonoscopy:  Not on file  PAP:  Not on file  Bone density:  Not on file  Lipid panel:  Not on file    Allergies  Allergen Reactions  . Tramadol Nausea Only  . Hydrocodone-Acetaminophen Itching and Rash    Current Outpatient Prescriptions  Medication Sig Dispense Refill  . alprazolam (XANAX) 2 MG tablet Take 2 mg by mouth daily.     . Ascorbic Acid (VITAMIN C) 100 MG tablet Take 100 mg by mouth daily.      . calcium-vitamin D (OSCAL WITH D) 500-200 MG-UNIT per tablet Take 1 tablet by mouth daily.      Marland Kitchen gabapentin (NEURONTIN) 100 MG capsule TAKE ONE CAPSULE BY MOUTH AT BEDTIME 30 capsule 2  . Multiple Vitamin (MULTIVITAMIN) capsule Take 1 capsule by mouth daily.      Marland Kitchen oxyCODONE-acetaminophen (PERCOCET) 10-325 MG per tablet Take 1 tablet by mouth every 6 (six) hours as needed.     . triamterene-hydrochlorothiazide (MAXZIDE-25) 37.5-25 MG per tablet TAKE 1 EACH (1 CAPSULE TOTAL) BY MOUTH EVERY MORNING. 30 tablet 2   No current facility-administered medications for this visit.   Facility-Administered Medications Ordered in Other Visits   Medication Dose Route Frequency Provider Last Rate Last Dose  . sodium chloride 0.9 % injection 10 mL  10 mL Intravenous PRN Chauncey Cruel, MD   10 mL at 03/04/14 1421    OBJECTIVE: Middle-aged [place] American woman who appears stated age 59 Vitals:   12/08/14 1321  BP: 102/66  Pulse: 74  Temp: 98.2 F (36.8 C)  Resp: 18     Body mass index is 29.1 kg/(m^2).    ECOG FS: 1 Filed Weights   12/08/14 1321  Weight: 180 lb 3.2 oz (81.738 kg)   Sclerae unicteric, pupils equal and reactive Oropharynx clear and moist No cervical or supraclavicular adenopathy Lungs no rales or rhonchi Heart regular rate and rhythm Abd soft, nontender, positive bowel sounds MSK no focal spinal tenderness, no upper extremity lymphedema Neuro: nonfocal, well oriented, appropriate affect Breasts: status post bilateral mastectomies. There is no evidence of  chest wall recurrence. Both axillae are benign.  LAB RESULTS:. Results for EMON, MIGGINS (MRN [id number]) as of 08/25/2014 14:01  Ref. Range 04/26/2012 10:55 05/17/2012 09:49 10/05/2012 14:28 05/29/2014 13:17 08/21/2014 13:43  CA 27.29 Latest Range: 0-39 U/mL 45 (H) 54 (H) 37 29 28   Lab Results  Component Value Date   WBC 6.5 10/20/2014   NEUTROABS 4.3 10/20/2014   HGB 13.4 10/20/2014   HCT 42.1 10/20/2014   MCV 87.2 10/20/2014   PLT 173 10/20/2014      Chemistry      Component Value Date/Time   NA 138 10/20/2014 1137   NA 140 08/14/2014 0100   K 3.9 10/20/2014 1137   K 3.8 08/14/2014 0100   CL 100 08/14/2014 0100   CL 101 03/07/2013 1411   CO2 27 10/20/2014 1137   CO2 28 08/14/2014 0100   BUN 10.3 10/20/2014 1137   BUN 16 08/14/2014 0100   CREATININE 1.0 10/20/2014 1137   CREATININE 0.78 08/14/2014 0100      Component Value Date/Time   CALCIUM 9.9 10/20/2014 1137   CALCIUM 9.5 08/14/2014 0100   ALKPHOS 166* 10/20/2014 1137   ALKPHOS 159* 08/14/2014 0100   AST 36* 10/20/2014 1137   AST 47* 08/14/2014 0100   ALT 34 10/20/2014  1137   ALT 35 08/14/2014 0100   BILITOT 0.35 10/20/2014 1137   BILITOT <0.2* 08/14/2014 0100       STUDIES: Dg Chest 2 View  08/21/2014   CLINICAL DATA:  Breast cancer, unspecified laterality. Evaluate for metastatic disease.  EXAM: CHEST  2 VIEW  COMPARISON:  CT chest 02/25/2014.  FINDINGS: Trachea is midline. Heart size normal. Left subclavian power port tip is in the SVC or at the SVC RA junction. Lungs are clear. No pleural fluid. Surgical clips in the right axillary region. Degenerative changes are seen in the spine.  IMPRESSION: No acute findings.  No radiographic evidence of metastatic disease.   Electronically Signed   By: Lorin Picket [name]   On: 08/21/2014 15:11    ASSESSMENT:  59 y.o. [place] woman with stage IV breast cancer manifested chiefly by loco-regional nodal disease (neck, chest), without lung, liver, bone, or brain involvement.  (1) Status post right lumpectomy and sentinel lymph node sampling 03/04/2002 for 2 separate foci of invasive ductal carcinoma, mpT2 pN1 or stage IIB, both foci grade 3, both estrogen and progesterone receptor positive, both HercepTest 3+, Mib-1 56%  (2) Reexcision for margins 05/27/2002 showed no residual cancer in the breast.  (3) The patient refused adjuvant systemic therapy.  (4) Adjuvant radiation treatment completed 08/14/2002.  (5) recurrence in the right breast in 02/2004 showing a morphologically different tumor, again grade 3, again estrogen and progesterone receptor negative, with an MIB-1 of 14% and Herceptest 3+.  (5) Between 03/2004 and 07/2004 she received dose dense Doxorubicin/Cyclophosphamide x 4 given with trastuzumab, followed by weekly Docetaxel x 8, again given with trastuzumab.  (6) Right mastectomy 07/13/2004 showed scattered microscopic foci of residual disease over an area  greater than 5 cm. Margins were negative.  (7) Postoperative Docetaxel continued until 09/2004.  (8) Trastuzumab (Herceptin) given 08/2004  through 01/2012 with some brief interruptions.  (9) Isolated right cervical nodal recurrence 10/2005, treated with radiation to the right supraclavicular area (total 41.5 gray) completed 12/29/2005.  (10) Navelbine given together with Herceptin  11/2005 through 03/2006.  (9) Left mastectomy 02/13/2006 for ductal carcinoma in situ, grade 2, estrogen and progesterone receptor negative, with negative margins; 0 of  3 lymph nodes involved  (10) treated with Lapatinib and Capecitabine before 10/2009, for an unclear duration and with unclear results (cannot locate data on chart review).  (11) Status post right supraclavicular lymph node biopsy 09/2010 again positive for an invasive ductal carcinoma, estrogen and progesterone receptor negative, HER-2 positive by CISH with a ratio 4.25.  (12) Navelbine given together with Herceptin between 05/2011 and 11/2011.  (13) Carboplatin/ Gemcitabine/ Herceptin given for 2 cycles, in 12/2011 and 01/2012.  (14) TDM-1 (Kadcyla) started 02/2012. Last dose 10/02/2013 after which the patient discontinued treatment at her own discretion. Most recent echo on December 2014 showed a well preserved ejection fraction.  (15) Deep vein thrombosis of the right upper extremity documented 04/20/2011.  She completed anticoagulant therapy with Coumadin on 03/25/2013.  (16) Chronic right upper extremity lymphedema.  (17) Right chest port-a-cath removal due to infection on 01/28/2013. Left chest Port-A-Cath placed on 04/08/2013; be flushed every 6 weeks   PLAN: Mylei continues to do well on observation, with no evidence of disease activity clinically. Discussed the recommendation for a chest x-ray and she has agreed to proceed with this. We will contact her when this result returns.  I have placed a referral back to the lymphedema clinic for reevaluation.  We're going to continue to flush her port every 6 weeks check her labs with each port flush and see her every 3  months. I have again tentatively scheduled a chest x-ray for the day of her return visit in 3 months. Of course if any other problems develop we would see her before that.  She has a good understanding of this plan. She agrees with it. She knows the goal of treatment in her case is control. She will call with any problems that may develop before the next visit.  [name], NP  12/08/2014 4:27 PM

## 2014-12-08 NOTE — Telephone Encounter (Signed)
Gave avs & calendar for March thru June

## 2014-12-08 NOTE — Patient Instructions (Signed)

## 2014-12-09 LAB — CANCER ANTIGEN 27.29: CA 27.29: 30 U/mL (ref 0–39)

## 2015-01-04 IMAGING — CR DG CHEST 2V
2 series · 2 of 2 positions shown · non-contrast
Comparison: CT chest 02/25/2014.

CLINICAL DATA: Breast cancer, unspecified laterality. Evaluate for
metastatic disease.

EXAM:
CHEST  2 VIEW

[w chest pa]
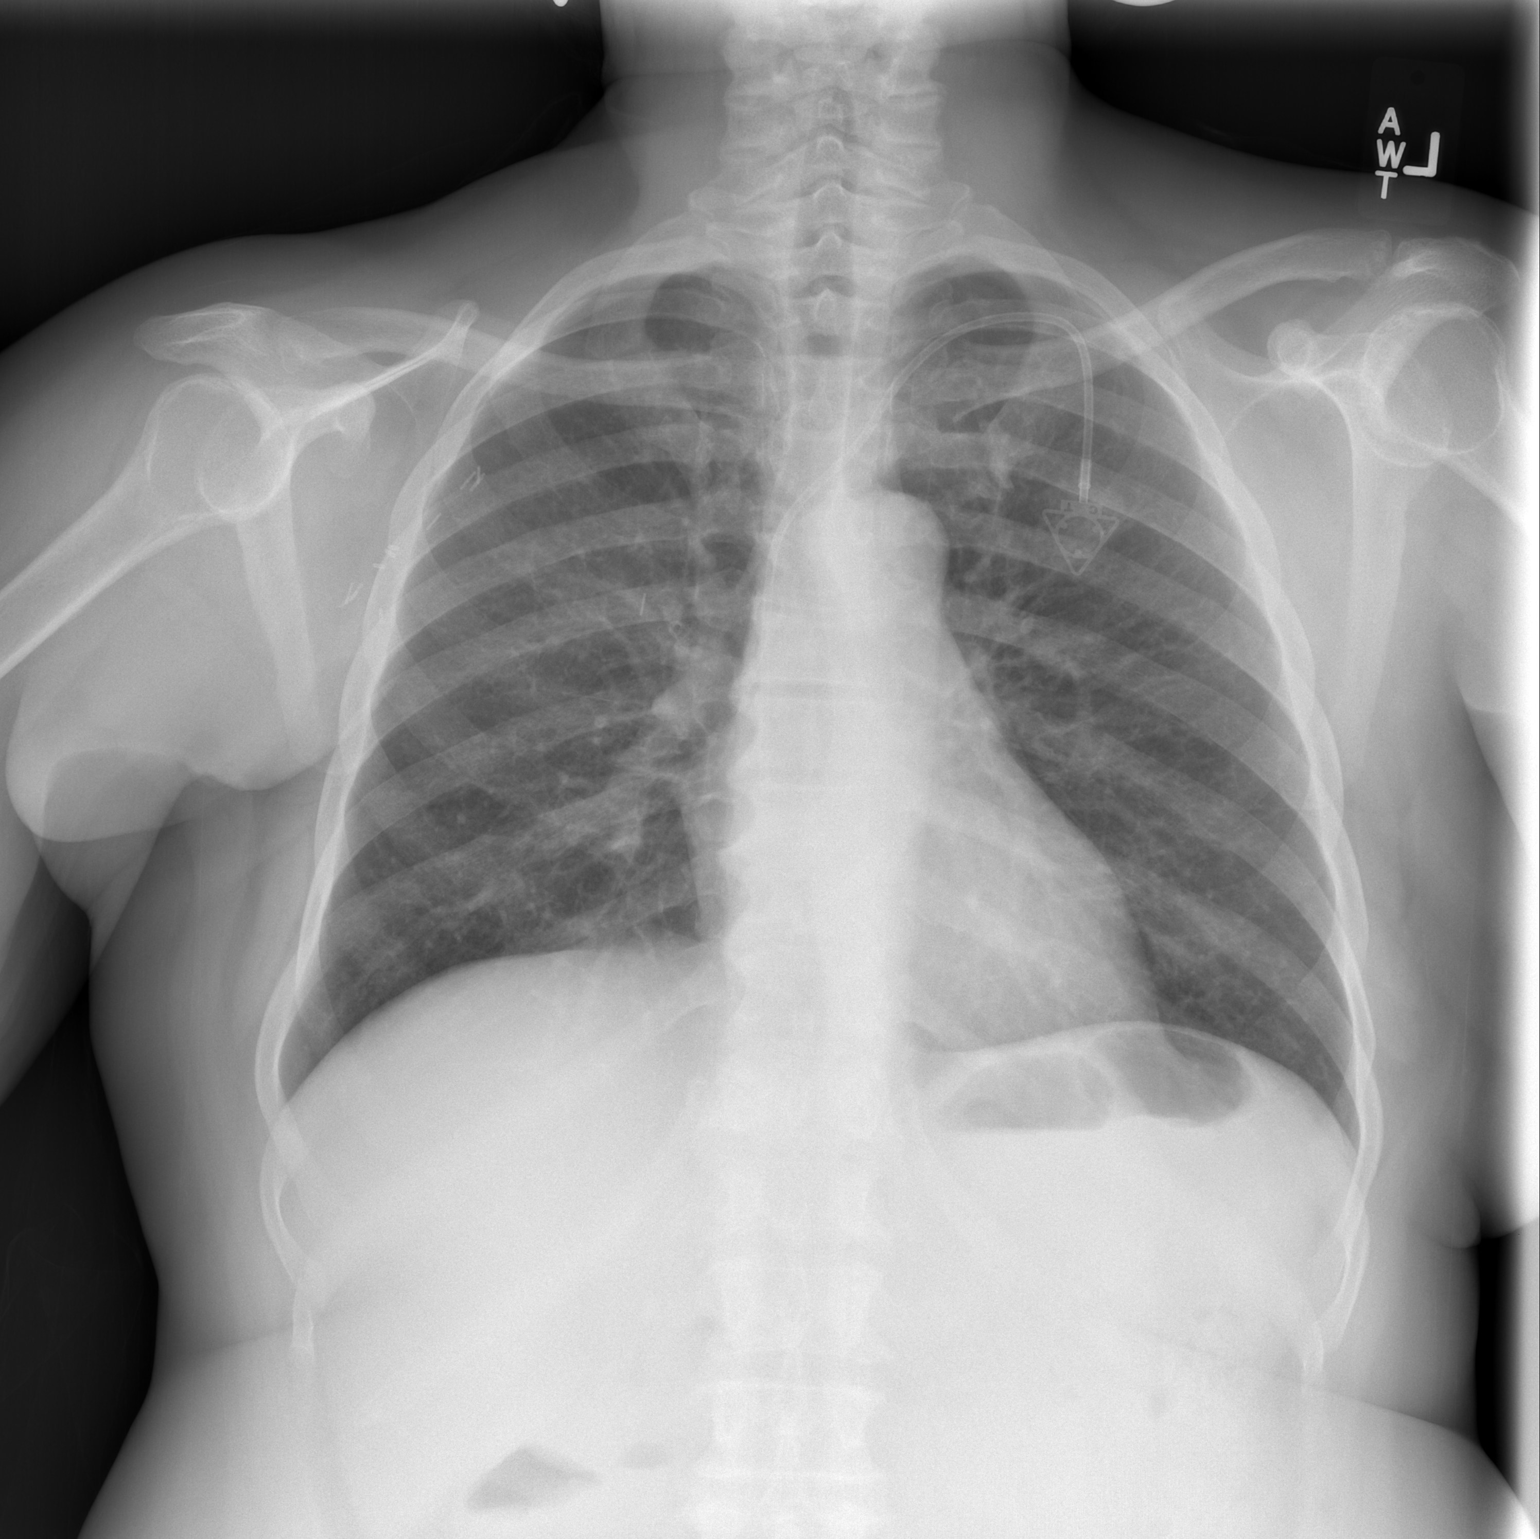

[w chest lat]
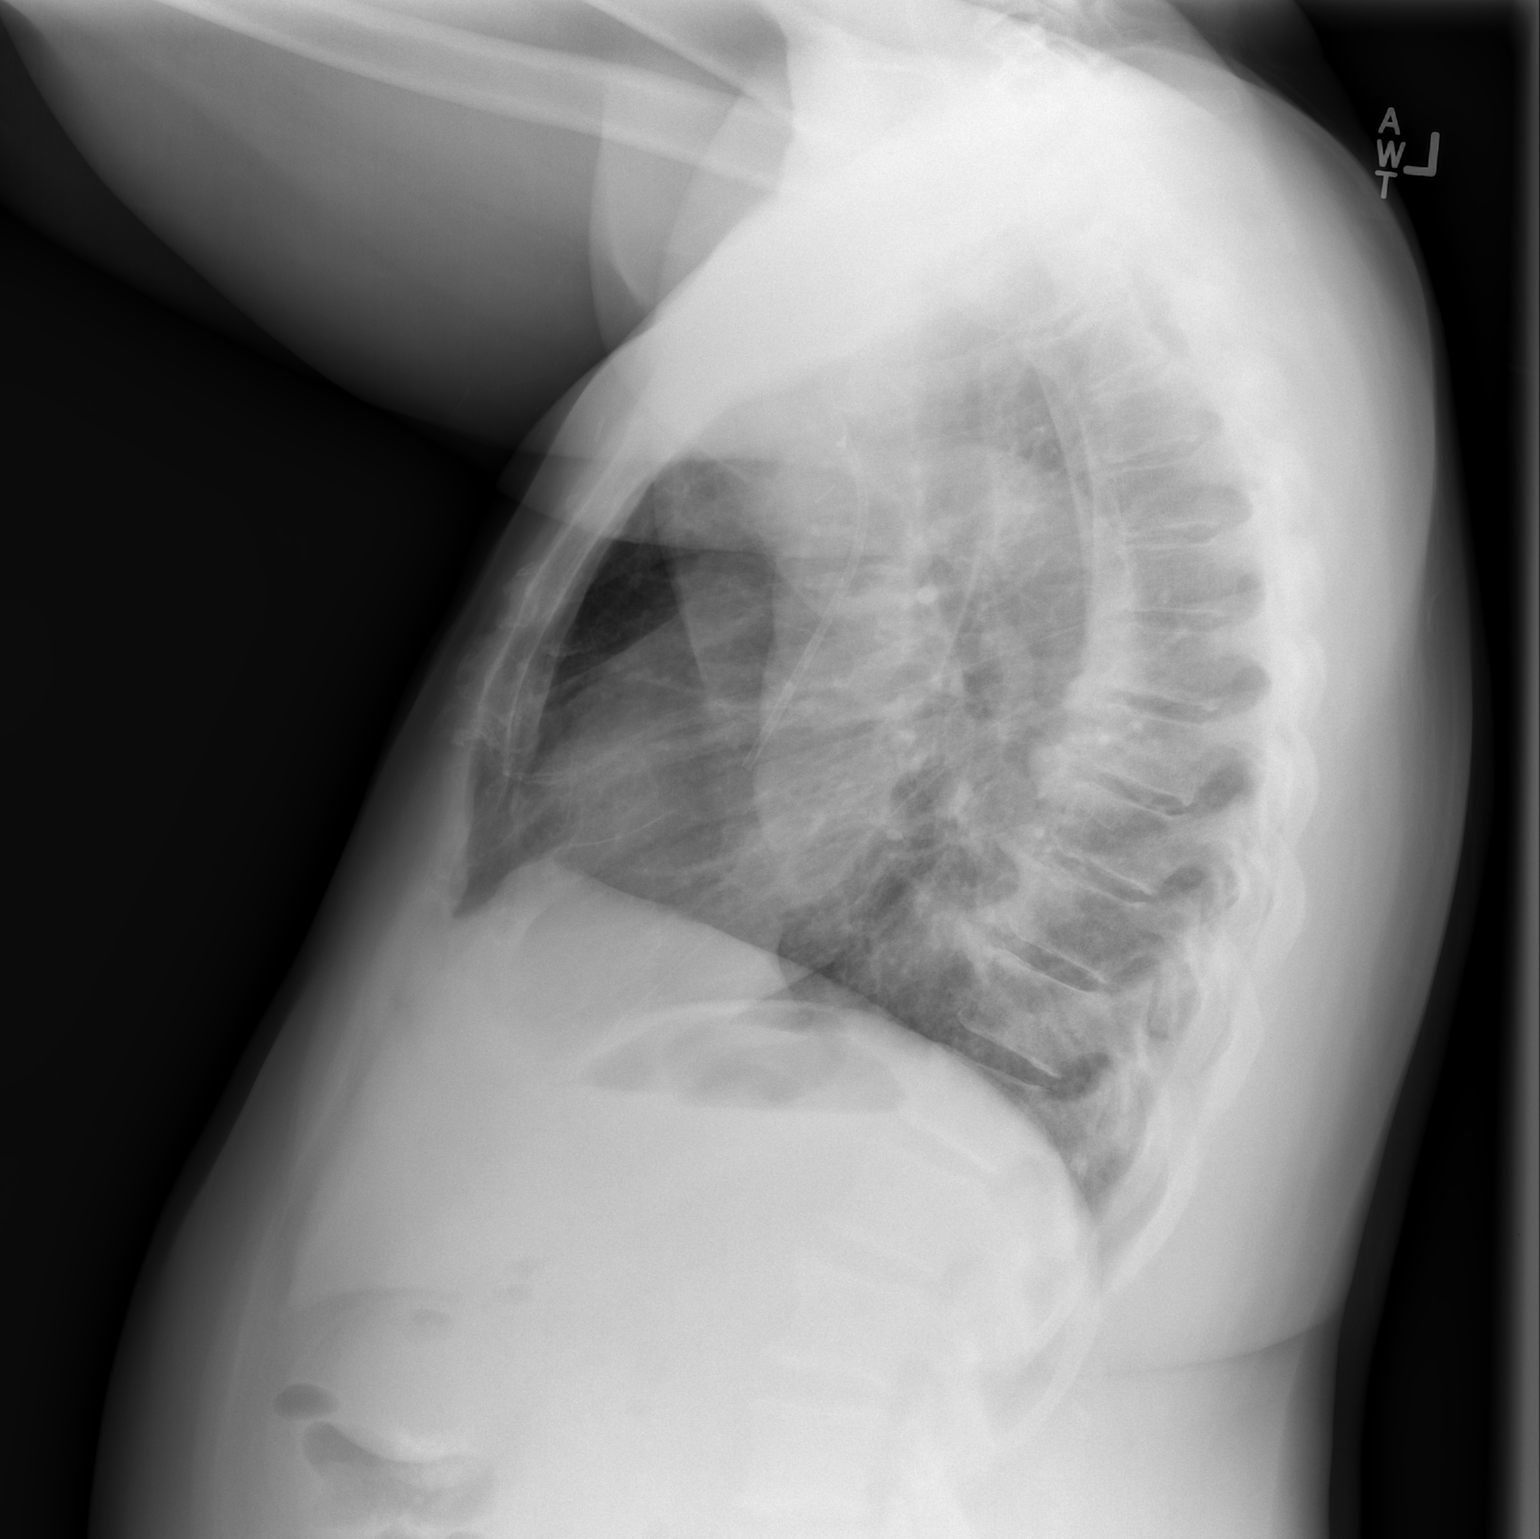

[2 of 2 positions shown; findings below may reference images not displayed]

FINDINGS: Trachea is midline. Heart size normal. Left subclavian power port
tip is in the SVC or at the SVC RA junction. Lungs are clear. No
pleural fluid. Surgical clips in the right axillary region.
Degenerative changes are seen in the spine.
IMPRESSION: No acute findings.  No radiographic evidence of metastatic disease.

## 2015-01-17 ENCOUNTER — Other Ambulatory Visit: Payer: Self-pay | Admitting: Oncology

## 2015-01-19 ENCOUNTER — Other Ambulatory Visit: Payer: Self-pay | Admitting: Emergency Medicine

## 2015-01-19 ENCOUNTER — Ambulatory Visit (HOSPITAL_BASED_OUTPATIENT_CLINIC_OR_DEPARTMENT_OTHER): Payer: Commercial Managed Care - PPO

## 2015-01-19 VITALS — BP 115/82 | HR 86 | Temp 97.8°F

## 2015-01-19 DIAGNOSIS — C50211 Malignant neoplasm of upper-inner quadrant of right female breast: Secondary | ICD-10-CM | POA: Diagnosis not present

## 2015-01-19 DIAGNOSIS — Z95828 Presence of other vascular implants and grafts: Secondary | ICD-10-CM

## 2015-01-19 DIAGNOSIS — Z452 Encounter for adjustment and management of vascular access device: Secondary | ICD-10-CM

## 2015-01-19 MED ORDER — HEPARIN SOD (PORK) LOCK FLUSH 100 UNIT/ML IV SOLN
500.0000 [IU] | Freq: Once | INTRAVENOUS | Status: AC
Start: 2015-01-19 — End: 2015-01-19
  Administered 2015-01-19: 500 [IU] via INTRAVENOUS
  Filled 2015-01-19: qty 5

## 2015-01-19 MED ORDER — SODIUM CHLORIDE 0.9 % IJ SOLN
10.0000 mL | INTRAMUSCULAR | Status: DC | PRN
  Administered 2015-01-19: 10 mL via INTRAVENOUS
  Filled 2015-01-19: qty 10

## 2015-01-19 MED ORDER — TRIAMTERENE-HCTZ 37.5-25 MG PO TABS: ORAL_TABLET | ORAL | Status: DC

## 2015-01-19 MED ORDER — GABAPENTIN 100 MG PO CAPS: 100.0000 mg | ORAL_CAPSULE | Freq: Every day | ORAL | Status: DC

## 2015-01-19 NOTE — Patient Instructions (Signed)

## 2015-01-20 ENCOUNTER — Encounter: Payer: Self-pay | Admitting: Nurse Practitioner

## 2015-01-20 ENCOUNTER — Ambulatory Visit (HOSPITAL_BASED_OUTPATIENT_CLINIC_OR_DEPARTMENT_OTHER): Payer: Commercial Managed Care - PPO

## 2015-01-20 ENCOUNTER — Ambulatory Visit (HOSPITAL_BASED_OUTPATIENT_CLINIC_OR_DEPARTMENT_OTHER): Payer: Commercial Managed Care - PPO | Admitting: Nurse Practitioner

## 2015-01-20 ENCOUNTER — Other Ambulatory Visit: Payer: Self-pay | Admitting: *Deleted

## 2015-01-20 ENCOUNTER — Telehealth: Payer: Self-pay | Admitting: *Deleted

## 2015-01-20 ENCOUNTER — Ambulatory Visit (HOSPITAL_COMMUNITY)
Admission: RE | Admit: 2015-01-20 | Discharge: 2015-01-20 | Disposition: A | Discharge status: Home / Self Care | Payer: Commercial Managed Care - PPO | Source: Ambulatory Visit | Attending: Nurse Practitioner | Admitting: Nurse Practitioner

## 2015-01-20 ENCOUNTER — Other Ambulatory Visit: Payer: Self-pay

## 2015-01-20 DIAGNOSIS — Z86718 Personal history of other venous thrombosis and embolism: Secondary | ICD-10-CM

## 2015-01-20 DIAGNOSIS — I890 Lymphedema, not elsewhere classified: Secondary | ICD-10-CM

## 2015-01-20 DIAGNOSIS — C50211 Malignant neoplasm of upper-inner quadrant of right female breast: Secondary | ICD-10-CM

## 2015-01-20 DIAGNOSIS — E8989 Other postprocedural endocrine and metabolic complications and disorders: Secondary | ICD-10-CM

## 2015-01-20 DIAGNOSIS — M7989 Other specified soft tissue disorders: Secondary | ICD-10-CM | POA: Diagnosis not present

## 2015-01-20 DIAGNOSIS — Z95828 Presence of other vascular implants and grafts: Secondary | ICD-10-CM

## 2015-01-20 LAB — CBC WITH DIFFERENTIAL/PLATELET
BASO%: 0.3 % (ref 0.0–2.0)
Basophils Absolute: 0 10*3/uL (ref 0.0–0.1)
EOS%: 2.7 % (ref 0.0–7.0)
Eosinophils Absolute: 0.2 10*3/uL (ref 0.0–0.5)
HCT: 40.4 % (ref 34.8–46.6)
HGB: 13.2 g/dL (ref 11.6–15.9)
LYMPH%: 19.4 % (ref 14.0–49.7)
MCH: 28.8 pg (ref 25.1–34.0)
MCHC: 32.7 g/dL (ref 31.5–36.0)
MCV: 88 fL (ref 79.5–101.0)
MONO#: 0.4 10*3/uL (ref 0.1–0.9)
MONO%: 6.4 % (ref 0.0–14.0)
NEUT#: 4.9 10*3/uL (ref 1.5–6.5)
NEUT%: 71.2 % (ref 38.4–76.8)
Platelets: 192 10*3/uL (ref 145–400)
RBC: 4.59 10*6/uL (ref 3.70–5.45)
RDW: 13.8 % (ref 11.2–14.5)
WBC: 6.9 10*3/uL (ref 3.9–10.3)
lymph#: 1.3 10*3/uL (ref 0.9–3.3)

## 2015-01-20 LAB — COMPREHENSIVE METABOLIC PANEL (CC13)
ALT: 21 U/L (ref 0–55)
AST: 34 U/L (ref 5–34)
Albumin: 3.7 g/dL (ref 3.5–5.0)
Alkaline Phosphatase: 105 U/L (ref 40–150)
Anion Gap: 13 mEq/L — ABNORMAL HIGH (ref 3–11)
BUN: 10 mg/dL (ref 7.0–26.0)
CO2: 28 mEq/L (ref 22–29)
Calcium: 9.5 mg/dL (ref 8.4–10.4)
Chloride: 104 mEq/L (ref 98–109)
Creatinine: 0.8 mg/dL (ref 0.6–1.1)
EGFR: 88 mL/min/{1.73_m2} — ABNORMAL LOW (ref >90)
Glucose: 121 mg/dl (ref 70–140)
Potassium: 3.6 mEq/L (ref 3.5–5.1)
Sodium: 144 mEq/L (ref 136–145)
Total Bilirubin: 0.25 mg/dL (ref 0.20–1.20)
Total Protein: 7.1 g/dL (ref 6.4–8.3)

## 2015-01-20 MED ORDER — HEPARIN SOD (PORK) LOCK FLUSH 100 UNIT/ML IV SOLN
500.0000 [IU] | Freq: Once | INTRAVENOUS | Status: AC
  Administered 2015-01-20: 500 [IU] via INTRAVENOUS
  Filled 2015-01-20: qty 5

## 2015-01-20 MED ORDER — SODIUM CHLORIDE 0.9 % IJ SOLN
10.0000 mL | INTRAMUSCULAR | Status: DC | PRN
  Administered 2015-01-20: 10 mL via INTRAVENOUS
  Filled 2015-01-20: qty 10

## 2015-01-20 MED ORDER — CEPHALEXIN 500 MG PO CAPS: 500.0000 mg | ORAL_CAPSULE | Freq: Four times a day (QID) | ORAL | Status: DC

## 2015-01-20 NOTE — Assessment & Plan Note (Signed)
Patient suffers with chronic right upper extremity lymphedema for the past several years.  She has been to the lymphedema clinic in the past; and wears a compression arm sleeve and hand sleeve on a regular basis.  She's also been diagnosed in the past with a right upper extremity DVT; but discontinued Coumadin in 2014.  She is complaining now of increased right arm swelling, some occasional discomfort to the arm, and one area of erythema.  She denies any known injury or trauma to the arm.  On exam-marked lymphedema to entire right upper extremity; including the hand.  No specific injury or trauma noted.  There is 1 small area of trace erythema to the right arm directly above the elbow.  Patient takes Percocet 10/325 for pain.  Doppler ultrasound obtained today was negative for DVT.  Will prescribe Keflex for any trace cellulitis symptoms.  Also, called and obtained a lymphedema clinic appointment for the patient for tomorrow 01/21/2015 for lymphedema massage instruction.  Patient will also need a new compression sleeve for both her arm into her hand if at all possible.  Advised patient to call/return if she develops any new or worsening symptoms whatsoever.

## 2015-01-20 NOTE — Assessment & Plan Note (Signed)
Patient is status post right breast mastectomy and radiation therapy completed in 2003.  Patient completed her last chemotherapy in 2013.  She is currently undergoing observation only.  Patient has plans to return on 03/16/2015 for labs and a follow-up visit.  She knows to call in the interim with any new worries or concerns.

## 2015-01-20 NOTE — Progress Notes (Signed)
VASCULAR LAB PRELIMINARY  PRELIMINARY  PRELIMINARY  PRELIMINARY  Right upper extremity venous duplex completed.    Preliminary report:  Right:  No evidence of DVT or superficial thrombosis.    [name], RVT 01/20/2015, 2:48 PM

## 2015-01-20 NOTE — Progress Notes (Signed)
SYMPTOM MANAGEMENT CLINIC   HPI: [name] 59 y.o. female diagnosed with breast cancer.  Patient is status post mastectomy and radiation therapy completed in 2003.  Patient last received chemotherapy in 2013.  Currently undergoing observation only.  Patient called the cancer Center today requesting urgent care visit.  She is complaining of increased right upper extremity lymphedema for the past several days.  She suffers with chronic lipedema to this arm; but feels that it is much larger and more firm now.  She is also complaining of occasional pain to this arm; as well as one area of erythema.  She denies any known injury or trauma to her arm.  Patient has a history of a DVT in the same arm in the past; and completed Coumadin therapy in 2014.  Patient states she has been to the lipedema clinic in the past; but does not do any lymphedema massage techniques.  She does faithfully wear her compression sleeve to both her arm into her hand.  She denies any chest pain, chest pressure, shortness breath, or pain with inspiration.  Chills denies any recent fevers or chills.   HPI  ROS  Past Medical History  Diagnosis Date  . DVT (deep venous thrombosis) 10/07/2011  . Breast cancer   . Hypertension   . Rheumatic fever     Past Surgical History  Procedure Laterality Date  . Mastectomy      bilateral  . Breast surgery    . Port a cath revision      has Essential hypertension; Secondary cardiomyopathy; GERD; Chest pain; DVT (deep venous thrombosis); Port or reservoir infection; Post-lymphadenectomy lymphedema of arm; Breast cancer of upper-inner quadrant of right female breast; and Left arm pain on her problem list.    is allergic to tramadol and hydrocodone-acetaminophen.    Medication List       This list is accurate as of: 01/20/15  5:02 PM.  Always use your most recent med list.               alprazolam 2 MG tablet  Commonly known as:  XANAX  Take 2 mg by mouth daily.       calcium-vitamin D 500-200 MG-UNIT per tablet  Commonly known as:  OSCAL WITH D  Take 1 tablet by mouth daily.     cephALEXin 500 MG capsule  Commonly known as:  KEFLEX  Take 1 capsule (500 mg total) by mouth 4 (four) times daily.     gabapentin 100 MG capsule  Commonly known as:  NEURONTIN  Take 1 capsule (100 mg total) by mouth at bedtime.     multivitamin capsule  Take 1 capsule by mouth daily.     oxymorphone 10 MG tablet  Commonly known as:  OPANA  Take 1 tablet by mouth 3 (three) times daily as needed.     triamterene-hydrochlorothiazide 37.5-25 MG per tablet  Commonly known as:  MAXZIDE-25  TAKE 1 EACH (1 CAPSULE TOTAL) BY MOUTH EVERY MORNING.     vitamin C 100 MG tablet  Take 100 mg by mouth daily.         PHYSICAL EXAMINATION  Oncology Vitals 01/19/2015 12/08/2014 10/20/2014 08/25/2014 08/18/2014 08/14/2014 07/10/2014  Height - 168 cm - 168 cm - 168 cm -  Weight - 81.738 kg - 82.237 kg 84.029 kg 81.647 kg -  Weight (lbs) - 180 lbs 3 oz - 181 lbs 5 oz 185 lbs 4 oz 180 lbs -  BMI (kg/m2) - 29.08 kg/m2 -  29.26 kg/m2 - 29.05 kg/m2 -  Temp 97.8 98.2 98.4 98.6 - 98.6 98.2  Pulse 86 74 95 75 74 80 85  Resp - 18 - _0 -  Resp (Historical as of 06/07/12) - - - - - - -  SpO2 - - - - 99 95 -  BSA (m2) - 1.95 m2 - 1.96 m2 - 1.95 m2 -   BP Readings from Last 3 Encounters:  01/19/15 115/82  12/08/14 102/66  10/20/14 115/98    Physical Exam  Constitutional: She is oriented to person, place, and time and well-developed, well-nourished, and in no distress.  HENT:  Head: Normocephalic and atraumatic.  Mouth/Throat: Oropharynx is clear and moist.  Eyes: Conjunctivae and EOM are normal. Pupils are equal, round, and reactive to light. Right eye exhibits no discharge. Left eye exhibits no discharge. No scleral icterus.  Neck: Normal range of motion. Neck supple.  Cardiovascular: Normal rate, regular rhythm, normal heart sounds and intact distal pulses.   Pulmonary/Chest:  Effort normal and breath sounds normal. No respiratory distress. She has no wheezes. She has no rales. She exhibits no tenderness.  Abdominal: Soft. Bowel sounds are normal. She exhibits no distension and no mass. There is no tenderness. There is no rebound and no guarding.  Musculoskeletal: Normal range of motion. She exhibits edema. She exhibits no tenderness.  Right upper extremity with marked lymphedema to her entire arm and hand.  There is one area of erythema directly above the right elbow.  No obvious injury or trauma on exam.  Patient with full range of motion.  Neurological: She is alert and oriented to person, place, and time. Gait normal.  Skin: Skin is warm and dry. No rash noted. There is erythema.  See previous note regarding right upper extremity area of erythema.  Psychiatric: Affect normal.  Nursing note and vitals reviewed.   LABORATORY DATA:. Appointment on 01/20/2015  Component Date Value Ref Range Status  . WBC 01/20/2015 6.9  3.9 - 10.3 10e3/uL Final  . NEUT# 01/20/2015 4.9  1.5 - 6.5 10e3/uL Final  . HGB 01/20/2015 13.2  11.6 - 15.9 g/dL Final  . HCT 01/20/2015 40.4  34.8 - 46.6 % Final  . Platelets 01/20/2015 192  145 - 400 10e3/uL Final  . MCV 01/20/2015 88.0  79.5 - 101.0 fL Final  . MCH 01/20/2015 28.8  25.1 - 34.0 pg Final  . MCHC 01/20/2015 32.7  31.5 - 36.0 g/dL Final  . RBC 01/20/2015 4.59  3.70 - 5.45 10e6/uL Final  . RDW 01/20/2015 13.8  11.2 - 14.5 % Final  . lymph# 01/20/2015 1.3  0.9 - 3.3 10e3/uL Final  . MONO# 01/20/2015 0.4  0.1 - 0.9 10e3/uL Final  . Eosinophils Absolute 01/20/2015 0.2  0.0 - 0.5 10e3/uL Final  . Basophils Absolute 01/20/2015 0.0  0.0 - 0.1 10e3/uL Final  . NEUT% 01/20/2015 71.2  38.4 - 76.8 % Final  . LYMPH% 01/20/2015 19.4  14.0 - 49.7 % Final  . MONO% 01/20/2015 6.4  0.0 - 14.0 % Final  . EOS% 01/20/2015 2.7  0.0 - 7.0 % Final  . BASO% 01/20/2015 0.3  0.0 - 2.0 % Final  . Sodium 01/20/2015 144  136 - 145 mEq/L Final  .  Potassium 01/20/2015 3.6  3.5 - 5.1 mEq/L Final  . Chloride 01/20/2015 104  98 - 109 mEq/L Final  . CO2 01/20/2015 28  22 - 29 mEq/L Final  . Glucose 01/20/2015 121  70 - 140  mg/dl Final  . BUN 01/20/2015 10.0  7.0 - 26.0 mg/dL Final  . Creatinine 01/20/2015 0.8  0.6 - 1.1 mg/dL Final  . Total Bilirubin 01/20/2015 0.25  0.20 - 1.20 mg/dL Final  . Alkaline Phosphatase 01/20/2015 105  40 - 150 U/L Final  . AST 01/20/2015 34  5 - 34 U/L Final  . ALT 01/20/2015 21  0 - 55 U/L Final  . Total Protein 01/20/2015 7.1  6.4 - 8.3 g/dL Final  . Albumin 01/20/2015 3.7  3.5 - 5.0 g/dL Final  . Calcium 01/20/2015 9.5  8.4 - 10.4 mg/dL Final  . Anion Gap 01/20/2015 13* 3 - 11 mEq/L Final  . EGFR 01/20/2015 88* >90 ml/min/1.73 m2 Final   eGFR is calculated using the CKD-EPI Creatinine Equation (2009)   Doppler US: VASCULAR LAB PRELIMINARY PRELIMINARY PRELIMINARY PRELIMINARY  Right upper extremity venous duplex completed.   Preliminary report: Right: No evidence of DVT or superficial thrombosis.    RADIOGRAPHIC STUDIES: No results found.  ASSESSMENT/PLAN:    Breast cancer of upper-inner quadrant of right female breast Patient is status post right breast mastectomy and radiation therapy completed in 2003.  Patient completed her last chemotherapy in 2013.  She is currently undergoing observation only.  Patient has plans to return on 03/16/2015 for labs and a follow-up visit.  She knows to call in the interim with any new worries or concerns.   Post-lymphadenectomy lymphedema of arm Patient suffers with chronic right upper extremity lymphedema for the past several years.  She has been to the lymphedema clinic in the past; and wears a compression arm sleeve and hand sleeve on a regular basis.  She's also been diagnosed in the past with a right upper extremity DVT; but discontinued Coumadin in 2014.  She is complaining now of increased right arm swelling, some occasional discomfort to the arm,  and one area of erythema.  She denies any known injury or trauma to the arm.  On exam-marked lymphedema to entire right upper extremity; including the hand.  No specific injury or trauma noted.  There is 1 small area of trace erythema to the right arm directly above the elbow.  Patient takes Percocet 10/325 for pain.  Doppler ultrasound obtained today was negative for DVT.  Will prescribe Keflex for any trace cellulitis symptoms.  Also, called and obtained a lymphedema clinic appointment for the patient for tomorrow 01/21/2015 for lymphedema massage instruction.  Patient will also need a new compression sleeve for both her arm into her hand if at all possible.  Advised patient to call/return if she develops any new or worsening symptoms whatsoever.   Patient stated understanding of all instructions; and was in agreement with this plan of care. The patient knows to call the clinic with any problems, questions or concerns.   Review/collaboration with Dr. Jana Hakim regarding all aspects of patient's visit today.   Total time spent with patient was 40 minutes;  with greater than 75 percent of that time spent in face to face counseling regarding patient's symptoms,  and coordination of care and follow up.  Disclaimer: This note was dictated with voice recognition software. Similar sounding words can inadvertently be transcribed and may not be corrected upon review.   Drue Second, NP 01/20/2015

## 2015-01-20 NOTE — Patient Instructions (Signed)

## 2015-01-20 NOTE — Telephone Encounter (Signed)
Patient called to report worsening lymphedema.  She reports having her Port a Cath flushed yesterday.  She said her arm is hard to the touch and painful to the touch, but the hand is more painful than the arm.  She wants to have an US of the arm. Suggested that she come in to see Selena Lesser in the [facility] today.  She said she can be here at 12:30.  Her speech was slurred and she reported having taken Percocet for pain this morning.

## 2015-01-21 ENCOUNTER — Ambulatory Visit: Payer: Commercial Managed Care - PPO | Admitting: Physical Therapy

## 2015-01-21 ENCOUNTER — Ambulatory Visit: Payer: Commercial Managed Care - PPO | Attending: Oncology | Admitting: Physical Therapy

## 2015-01-21 DIAGNOSIS — I972 Postmastectomy lymphedema syndrome: Secondary | ICD-10-CM | POA: Insufficient documentation

## 2015-01-21 DIAGNOSIS — M25611 Stiffness of right shoulder, not elsewhere classified: Secondary | ICD-10-CM

## 2015-01-21 NOTE — Therapy (Signed)
[place], [place], [address] Phone: [phone]   Fax:  [phone]  Physical Therapy Evaluation  Patient Details  Name: [name] MRN: [id number] Date of Birth: 1956-02-22 Referring Provider:  Chauncey Cruel, MD  Encounter Date: 01/21/2015      PT End of Session - 01/21/15 1521    Visit Number 1   Number of Visits 19   Date for PT Re-Evaluation 03/22/15   PT Start Time 1050   PT Stop Time 1123   PT Time Calculation (min) 33 min   Activity Tolerance Patient tolerated treatment well   Behavior During Therapy [facility] for tasks assessed/performed      Past Medical History  Diagnosis Date  . DVT (deep venous thrombosis) 10/07/2011  . Breast cancer   . Hypertension   . Rheumatic fever     Past Surgical History  Procedure Laterality Date  . Mastectomy      bilateral  . Breast surgery    . Port a cath revision      There were no vitals filed for this visit.  Visit Diagnosis:  Postmastectomy lymphedema - Plan: PT plan of care cert/re-cert  Stiffness of joint, shoulder region, right - Plan: PT plan of care cert/re-cert      Subjective Assessment - 01/21/15 1054    Symptoms Here because the right arm swelling hasn't been well controlled with garments, arm is firmer now.     Pertinent History Had Doppler yesterday, which was negative for the right arm.  Just was prescribed an antibiotic because of redness in right arm.   Patient Stated Goals pain improvement   Currently in Pain? Yes   Pain Score 7    Pain Location Arm   Pain Orientation Right   Pain Descriptors / Indicators Aching   Aggravating Factors  not doing anything; taking sleeve off   Pain Relieving Factors having compression sleeve on            [facility] PT Assessment - 01/21/15 0001    Assessment   Medical Diagnosis metastatic breast cancer   Precautions   Precautions None   Precaution Comments cancer precautions;  metastases   Restrictions   Weight Bearing Restrictions No   Balance Screen   Has the patient fallen in the past 6 months No  has trouble getting out of the bathtub   Has the patient had a decrease in activity level because of a fear of falling?  No   Is the patient reluctant to leave their home because of a fear of falling?  No   Prior Function   Level of Independence Independent with basic ADLs;Independent with homemaking with ambulation;Independent with gait   Observation/Other Assessments   Skin Integrity Intact; skin of right UE is reddened and warm to the touch.  (Pt. has been prescribed an antibiotic but hasn't picked it up yet.)   Posture/Postural Control   Posture/Postural Control Postural limitations   Postural Limitations Rounded Shoulders;Forward head   ROM / Strength   AROM / PROM / Strength AROM   AROM   Overall AROM Comments Right shoulder limited by the weight of the arm with fluid.           LYMPHEDEMA/ONCOLOGY QUESTIONNAIRE - 01/21/15 1102    What other symptoms do you have   Are you having pitting edema Yes   Other Symptoms fibrosis, worst at posterior upper arm   Lymphedema Assessments   Lymphedema Assessments Upper extremities  Right Upper Extremity Lymphedema   15 cm Proximal to Olecranon Process 42.6 cm   10 cm Proximal to Olecranon Process 40.8 cm   Olecranon Process 36.5 cm   15 cm Proximal to Ulnar Styloid Process 34.8 cm   10 cm Proximal to Ulnar Styloid Process 27.9 cm   Just Proximal to Ulnar Styloid Process 20.4 cm   Across Hand at [employer] 20.8 cm   At [place] of 2nd Digit 7 cm   Left Upper Extremity Lymphedema   15 cm Proximal to Olecranon Process 35.1 cm   10 cm Proximal to Olecranon Process 33.5 cm   Olecranon Process 26.6 cm   15 cm Proximal to Ulnar Styloid Process 26 cm   10 cm Proximal to Ulnar Styloid Process 21.5 cm   Just Proximal to Ulnar Styloid Process 16.1 cm   Across Hand at [employer] 19.4 cm   At [place] of 2nd  Digit 6.3 cm                           Short Term Clinic Goals - 01/21/15 2025    CC Short Term Goal  #1   Title Rt. UE circumference at 15 cm. proximal to olecranon will reduce by at least 1.5 cm.   Baseline 42.6   Time 3   Period Weeks   Status New   CC Short Term Goal  #2   Title Rt. UE circumference at 15 cm. proximal to ulnar styloid will reduce by at least 2 cm.   Baseline 34.8   Time 3   Period Weeks   Status New             Long Term Clinic Goals - 01/21/15 2029    CC Long Term Goal  #1   Title Rt. UE circumference at 15 cm. proximal to olecranon will reduce by at least 2 cm.   Baseline 42.6   Time 5   Period Weeks   Status New   CC Long Term Goal  #2   Title Rt. UE circumference at 15 cm. proximal to ulnar styloid will reduce by at least 3 cm.   Baseline 34.8   Time 5   Period Weeks   Status New   CC Long Term Goal  #3   Title Pt. will have been measured for new daytime compression garments.   Time 5   Period Weeks   Status New   CC Long Term Goal  #4   Title Pt. will be independent in self-management of lymphedema, including manual lymph drainage.   Time 6   Period Weeks   Status New            Plan - 01/21/15 1522    Clinical Impression Statement Pt. known to this clinic from multiple episodes of care returns now with measurable increased swelling in left arm and increased fibrosis as well.   Pt will benefit from skilled therapeutic intervention in order to improve on the following deficits Increased edema;Other (comment);Decreased knowledge of use of DME  fibrosis   Rehab Potential Fair   PT Frequency 3x / week   PT Duration 6 weeks   PT Treatment/Interventions Manual lymph drainage;Compression bandaging;Patient/family education;ADLs/Self Care Home Management;Therapeutic exercise;Passive range of motion;Other (comment)  assist with compression garments   PT Next Visit Plan Pt. to start antibiotic today, so will be  scheduled in one week or later.  Begin complete decongestive therapy, ROM  as needed, and assist with new garments.   Consulted and Agree with Plan of Care Patient          G-Codes - 2015-01-31 02/14/30    Functional Assessment Tool Used clinical judgement   Functional Limitation Carrying, moving and handling objects   Carrying, Moving and Handling Objects Current Status [phone]) At least 40 percent but less than 60 percent impaired, limited or restricted   Carrying, Moving and Handling Objects Goal Status (O0321) At least 20 percent but less than 40 percent impaired, limited or restricted       Problem List Patient Active Problem List   Diagnosis Date Noted  . Left arm pain 08/18/2014  . Breast cancer of upper-inner quadrant of right female breast 10/03/2013  . Post-lymphadenectomy lymphedema of arm 08/15/2013  . Port or reservoir infection 01/29/2013  . DVT (deep venous thrombosis) 10/07/2011  . Chest pain 09/10/2009  . Secondary cardiomyopathy 09/02/2009  . Essential hypertension 02/26/2009  . GERD 02/26/2009    Zanetta Dehaan January 31, 2015, 8:34 PM  [place], [place], [address] Phone: [phone]   Fax:  [place], PT 2015-01-31 8:34 PM

## 2015-01-27 ENCOUNTER — Telehealth: Payer: Self-pay | Admitting: *Deleted

## 2015-01-27 NOTE — Telephone Encounter (Addendum)
Patient returning called as noted below. Patient left VM stating she is doing well with the antibiotic that Cyndee prescribed. Denies any new issues or concerns.

## 2015-01-27 NOTE — Telephone Encounter (Signed)
Left message for patient to follow up on her office visit with Selena Lesser, NP on 01/20/15. Pt was prescribed keflex. Wanting to see if she noticed any improvement. To call if has questions or concerns

## 2015-01-28 ENCOUNTER — Ambulatory Visit: Payer: Commercial Managed Care - PPO

## 2015-01-28 DIAGNOSIS — M25611 Stiffness of right shoulder, not elsewhere classified: Secondary | ICD-10-CM

## 2015-01-28 DIAGNOSIS — I972 Postmastectomy lymphedema syndrome: Secondary | ICD-10-CM

## 2015-01-28 NOTE — Patient Instructions (Signed)
Deep Effective Breath   Standing, sitting, or laying down, place both hands on the belly. Take a deep breath IN, expanding the belly; then breath OUT, contracting the belly. Repeat __5__ times. Do __2-3__ sessions per day and before your self massage.  http://gt2.exer.us/866   Axilla to Inguinal Nodes - Sweep   On involved side, make 5 circles at groin at panty line, then pump _5__ times from armpit along side of trunk to outer hip, making your other pathway. Do __1_ time per day.  Copyright  VHI. All rights reserved.  Arm Posterior: Elbow to Shoulder - Sweep   Pump _5__ times from back of elbow to top of shoulder. Then inner to outer upper arm _5_ times, then outer arm again _5_ times. Then back to the pathways _2-3_ times. Do _1__ time per day.  Copyright  VHI. All rights reserved.  ARM: [place] Wrist to Elbow - Sweep   Pump or stationary circles _5__ times from wrist to elbow making sure to do both sides of the forearm. Then retrace your steps to the outer arm, and the pathways _2-3_ times each. Do _1__ time per day.  Copyright  VHI. All rights reserved.  ARM: Dorsum of Hand to Shoulder - Sweep   Pump or stationary circles _5__ times on back of hand including knuckle spaces and individual fingers if needed working up towards the wrist, then retrace all your steps working back up the forearm, doing both sides; upper outer arm and back to your pathways _2-3_ times each. Then do 5 circles again at uninvolved armpit and involved groin where you started! Good job!! Do __1_ time per day.  Copyright  VHI. All rights reserved.

## 2015-01-28 NOTE — Therapy (Signed)
[place], [place], [address] Phone: [phone]   Fax:  [phone]  Physical Therapy Treatment  Patient Details  Name: [name] MRN: [id number] Date of Birth: Jan 17, 1956 Referring Provider:  [name], MD  Encounter Date: 01/28/2015      PT End of Session - 01/28/15 1616    Visit Number 2   Number of Visits 19   Date for PT Re-Evaluation 03/22/15   PT Start Time 1520   PT Stop Time 1611   PT Time Calculation (min) 51 min      Past Medical History  Diagnosis Date  . DVT (deep venous thrombosis) 10/07/2011  . Breast cancer   . Hypertension   . Rheumatic fever     Past Surgical History  Procedure Laterality Date  . Mastectomy      bilateral  . Breast surgery    . Port a cath revision      There were no vitals filed for this visit.  Visit Diagnosis:  Postmastectomy lymphedema  Stiffness of joint, shoulder region, right                     [facility] Adult PT Treatment/Exercise - 01/28/15 0001    Manual Therapy   Manual Lymphatic Drainage (MLD) In Supine: Short neck, diaphragmatic breathing, Rt inguinal nodes, Rt axillo-inguinal anastomosis, Rt UE from dorsal hand to lateral shoulder.    Compression Bandaging Biotone lotion applied, stockinette, Elastomull to all 5 fingers, 1-6, 1-10 and 2-12 cm short stretch compression banadages from hand to axilla.                 PT Education - 01/28/15 1613    Education provided Yes   Education Details Self manual lymph drainage   Person(s) Educated Patient   Methods Explanation;Demonstration;Handout   Comprehension Verbalized understanding;Need further instruction           Short Term Clinic Goals - 01/21/15 2025    CC Short Term Goal  #1   Title Rt. UE circumference at 15 cm. proximal to olecranon will reduce by at least 1.5 cm.   Baseline 42.6   Time 3   Period Weeks   Status New   CC Short Term  Goal  #2   Title Rt. UE circumference at 15 cm. proximal to ulnar styloid will reduce by at least 2 cm.   Baseline 34.8   Time 3   Period Weeks   Status New             Long Term Clinic Goals - 01/28/15 1619    CC Long Term Goal  #4   Title Pt. will be independent in self-management of lymphedema, including manual lymph drainage.   Status On-going            Plan - 01/28/15 1616    Clinical Impression Statement Began instruction of self manual lymph drainage and today was first day of beginning complete decongestive therapy this episode as pt has been on antibiotics a week now. Still slight redness present but pt reports this much improved from last week.    Pt will benefit from skilled therapeutic intervention in order to improve on the following deficits Increased edema;Other (comment);Decreased knowledge of use of DME  Fibrosis   Rehab Potential Fair   PT Frequency 3x / week   PT Duration 6 weeks   PT Treatment/Interventions Manual lymph drainage;Compression bandaging;Patient/family education;ADLs/Self Care Home Management;Therapeutic exercise;Passive range of motion;Other (  comment)  assist with compression garments   PT Next Visit Plan Continue complete decongestive therapy, will probably need to add gray foam to arm as well. ROM prn and assist with new garments.    Consulted and Agree with Plan of Care Patient        Problem List Patient Active Problem List   Diagnosis Date Noted  . Left arm pain 08/18/2014  . Breast cancer of upper-inner quadrant of right female breast 10/03/2013  . Post-lymphadenectomy lymphedema of arm 08/15/2013  . Port or reservoir infection 01/29/2013  . DVT (deep venous thrombosis) 10/07/2011  . Chest pain 09/10/2009  . Secondary cardiomyopathy 09/02/2009  . Essential hypertension 02/26/2009  . GERD 02/26/2009    [name], PTA 01/28/2015, 4:20 PM  [place], [place], [address] Phone: [phone]   Fax:  [phone]

## 2015-02-02 ENCOUNTER — Ambulatory Visit: Payer: Commercial Managed Care - PPO | Admitting: Physical Therapy

## 2015-02-02 DIAGNOSIS — I972 Postmastectomy lymphedema syndrome: Secondary | ICD-10-CM | POA: Diagnosis not present

## 2015-02-02 DIAGNOSIS — M25611 Stiffness of right shoulder, not elsewhere classified: Secondary | ICD-10-CM | POA: Diagnosis not present

## 2015-02-02 NOTE — Therapy (Signed)
[place], [place], [address] Phone: [phone]   Fax:  [phone]  Physical Therapy Treatment  Patient Details  Name: [name] MRN: [id number] Date of Birth: January 19, 1956 Referring Provider:  [name], MD  Encounter Date: 02/02/2015      PT End of Session - 02/02/15 0944    Visit Number 3   Number of Visits 19   Date for PT Re-Evaluation 03/22/15   PT Start Time 0800   PT Stop Time 0846   PT Time Calculation (min) 46 min   Activity Tolerance Patient tolerated treatment well   Behavior During Therapy [facility] for tasks assessed/performed      Past Medical History  Diagnosis Date  . DVT (deep venous thrombosis) 10/07/2011  . Breast cancer   . Hypertension   . Rheumatic fever     Past Surgical History  Procedure Laterality Date  . Mastectomy      bilateral  . Breast surgery    . Port a cath revision      There were no vitals filed for this visit.  Visit Diagnosis:  Postmastectomy lymphedema      Subjective Assessment - 02/02/15 0802    Symptoms Finished the antibiotic.   Currently in Pain? Yes   Pain Score 5    Pain Location Arm   Pain Orientation Right  plus left arm aches   Aggravating Factors  stretching   Pain Relieving Factors rest                       [facility] Adult PT Treatment/Exercise - 02/02/15 0001    Manual Therapy   Manual Lymphatic Drainage (MLD) In supine, short neck, superficial and deep abdomen, left axilla and anterior interaxillary anastomosis, right groin and axillo-inguinal anastomosis, and right UE from fingers to shoulder.   Compression Bandaging Lotion applied, stockinette, Elastomull to all 5 fingers, peach Medi small dot foam around upper arm (especially posterior aspect), Artiflex x 1 1/2, 1-6, 1-10 and 2-12 cm short stretch compression banadages from hand to axilla.                    Short Term Clinic Goals -  01/21/15 2025    CC Short Term Goal  #1   Title Rt. UE circumference at 15 cm. proximal to olecranon will reduce by at least 1.5 cm.   Baseline 42.6   Time 3   Period Weeks   Status New   CC Short Term Goal  #2   Title Rt. UE circumference at 15 cm. proximal to ulnar styloid will reduce by at least 2 cm.   Baseline 34.8   Time 3   Period Weeks   Status New             Long Term Clinic Goals - 01/28/15 1619    CC Long Term Goal  #4   Title Pt. will be independent in self-management of lymphedema, including manual lymph drainage.   Status On-going            Plan - 02/02/15 0946    Clinical Impression Statement Arm looks better with less redness; still very fibrotic at posterior upper arm.   Pt will benefit from skilled therapeutic intervention in order to improve on the following deficits Increased edema;Other (comment)  fibrosis   PT Treatment/Interventions Manual lymph drainage;Compression bandaging;Patient/family education;ADLs/Self Care Home Management;Therapeutic exercise;Passive range of motion;Other (comment)   PT Next Visit Plan  Remeasure; continue complete decongestive therapy.   Consulted and Agree with Plan of Care Patient        Problem List Patient Active Problem List   Diagnosis Date Noted  . Left arm pain 08/18/2014  . Breast cancer of upper-inner quadrant of right female breast 10/03/2013  . Post-lymphadenectomy lymphedema of arm 08/15/2013  . Port or reservoir infection 01/29/2013  . DVT (deep venous thrombosis) 10/07/2011  . Chest pain 09/10/2009  . Secondary cardiomyopathy 09/02/2009  . Essential hypertension 02/26/2009  . GERD 02/26/2009    [name] 02/02/2015, 9:50 AM  [name], [place], [address] Phone: [phone]   Fax:  [place], PT 02/02/2015 9:50 AM

## 2015-02-04 ENCOUNTER — Ambulatory Visit: Payer: Commercial Managed Care - PPO | Admitting: Physical Therapy

## 2015-02-04 ENCOUNTER — Encounter: Payer: Self-pay | Admitting: Physical Therapy

## 2015-02-04 DIAGNOSIS — I972 Postmastectomy lymphedema syndrome: Secondary | ICD-10-CM

## 2015-02-04 DIAGNOSIS — M25611 Stiffness of right shoulder, not elsewhere classified: Secondary | ICD-10-CM | POA: Diagnosis not present

## 2015-02-04 NOTE — Therapy (Signed)
[place], [place], [address] Phone: [phone]   Fax:  [phone]  Physical Therapy Treatment  Patient Details  Name: [name] MRN: [id number] Date of Birth: 15-Sep-1956 Referring Provider:  [name], MD  Encounter Date: 02/04/2015      PT End of Session - 02/04/15 1538    Visit Number 4   Number of Visits 19   Date for PT Re-Evaluation 03/22/15   PT Start Time 1510   PT Stop Time 1535   PT Time Calculation (min) 25 min   Activity Tolerance Patient tolerated treatment well   Behavior During Therapy [facility] for tasks assessed/performed      Past Medical History  Diagnosis Date  . DVT (deep venous thrombosis) 10/07/2011  . Breast cancer   . Hypertension   . Rheumatic fever     Past Surgical History  Procedure Laterality Date  . Mastectomy      bilateral  . Breast surgery    . Port a cath revision      There were no vitals filed for this visit.  Visit Diagnosis:  Postmastectomy lymphedema      Subjective Assessment - 02/04/15 1536    Symptoms I stayed wrapped.  No problems.  Patient arrived 45 minutes late (fit her in to complete bandaging).   Currently in Pain? No/denies             [facility] Adult PT Treatment/Exercise - 02/04/15 0001    Manual Therapy   Compression Bandaging Lotion applied, stockinette, Elastomull to all 5 fingers, peach Medi small dot foam around upper arm (especially posterior aspect), Artiflex x 1 1/2, 1-6, 1-10 and 2-12 cm short stretch compression banadages from hand to axilla.                    Short Term Clinic Goals - 01/21/15 2025    CC Short Term Goal  #1   Title Rt. UE circumference at 15 cm. proximal to olecranon will reduce by at least 1.5 cm.   Baseline 42.6   Time 3   Period Weeks   Status New   CC Short Term Goal  #2   Title Rt. UE circumference at 15 cm. proximal to ulnar styloid will reduce by at least 2 cm.   Baseline 34.8   Time 3   Period Weeks   Status New             Long Term Clinic Goals - 01/28/15 1619    CC Long Term Goal  #4   Title Pt. will be independent in self-management of lymphedema, including manual lymph drainage.   Status On-going            Plan - 02/04/15 1538    Clinical Impression Statement Patient continues to have fibrosis present on right upper arm, mostly medially.  She will benefit from continued PT to reduce swelling as it is severe.   Pt will benefit from skilled therapeutic intervention in order to improve on the following deficits Increased edema;Other (comment)   Rehab Potential Good   PT Frequency 3x / week   PT Duration 6 weeks   PT Treatment/Interventions Manual lymph drainage;Compression bandaging;Patient/family education;ADLs/Self Care Home Management;Therapeutic exercise;Passive range of motion;Other (comment)   PT Next Visit Plan Remeasure; continue complete decongestive therapy.   Consulted and Agree with Plan of Care Patient        Problem List Patient Active Problem List   Diagnosis Date  Noted  . Left arm pain 08/18/2014  . Breast cancer of upper-inner quadrant of right female breast 10/03/2013  . Post-lymphadenectomy lymphedema of arm 08/15/2013  . Port or reservoir infection 01/29/2013  . DVT (deep venous thrombosis) 10/07/2011  . Chest pain 09/10/2009  . Secondary cardiomyopathy 09/02/2009  . Essential hypertension 02/26/2009  . GERD 02/26/2009    [name], PT 02/04/2015, 3:40 PM  [place], [place], [address] Phone: [phone]   Fax:  [phone]

## 2015-02-09 ENCOUNTER — Encounter: Payer: Self-pay | Admitting: Nurse Practitioner

## 2015-02-09 ENCOUNTER — Encounter (HOSPITAL_COMMUNITY): Payer: Commercial Managed Care - PPO

## 2015-02-09 ENCOUNTER — Ambulatory Visit (HOSPITAL_COMMUNITY)
Admission: RE | Admit: 2015-02-09 | Discharge: 2015-02-09 | Disposition: A | Discharge status: Home / Self Care | Payer: Commercial Managed Care - PPO | Source: Ambulatory Visit | Attending: Nurse Practitioner | Admitting: Nurse Practitioner

## 2015-02-09 ENCOUNTER — Ambulatory Visit (HOSPITAL_BASED_OUTPATIENT_CLINIC_OR_DEPARTMENT_OTHER): Payer: Commercial Managed Care - PPO | Admitting: Nurse Practitioner

## 2015-02-09 ENCOUNTER — Ambulatory Visit: Payer: Commercial Managed Care - PPO | Attending: Oncology | Admitting: Physical Therapy

## 2015-02-09 VITALS — BP 107/57 | HR 74 | Temp 98.5°F | Resp 18 | Wt 174.0 lb

## 2015-02-09 DIAGNOSIS — M25611 Stiffness of right shoulder, not elsewhere classified: Secondary | ICD-10-CM | POA: Diagnosis not present

## 2015-02-09 DIAGNOSIS — I972 Postmastectomy lymphedema syndrome: Secondary | ICD-10-CM | POA: Insufficient documentation

## 2015-02-09 DIAGNOSIS — M79602 Pain in left arm: Secondary | ICD-10-CM

## 2015-02-09 DIAGNOSIS — I890 Lymphedema, not elsewhere classified: Secondary | ICD-10-CM

## 2015-02-09 DIAGNOSIS — C50211 Malignant neoplasm of upper-inner quadrant of right female breast: Secondary | ICD-10-CM

## 2015-02-09 DIAGNOSIS — Z86718 Personal history of other venous thrombosis and embolism: Secondary | ICD-10-CM

## 2015-02-09 DIAGNOSIS — E8989 Other postprocedural endocrine and metabolic complications and disorders: Secondary | ICD-10-CM

## 2015-02-09 MED ORDER — OXYCODONE HCL 5 MG PO TABS: ORAL_TABLET | ORAL | Status: DC

## 2015-02-09 MED ORDER — OXYCODONE-ACETAMINOPHEN 10-325 MG PO TABS: 1.0000 | ORAL_TABLET | Freq: Four times a day (QID) | ORAL | Status: DC | PRN

## 2015-02-09 MED ORDER — CEPHALEXIN 500 MG PO CAPS: 500.0000 mg | ORAL_CAPSULE | Freq: Four times a day (QID) | ORAL | Status: DC

## 2015-02-09 MED ORDER — OXYCODONE HCL 10 MG PO TABS: ORAL_TABLET | ORAL | Status: DC

## 2015-02-09 NOTE — Progress Notes (Signed)
VASCULAR LAB PRELIMINARY  PRELIMINARY  PRELIMINARY  PRELIMINARY  Right upper extremity venous duplex completed.    Preliminary report:  Right :  No evidence of DVT or superficial thrombosis.  Moderate edema noted throughout the right upper extremity  [name], RVS 02/09/2015, 1:53 PM

## 2015-02-09 NOTE — Assessment & Plan Note (Signed)
Patient suffers with chronic right upper extremity lymphedema for the past several years.  She has been to the lymphedema clinic in the past; and wears a compression arm sleeve and hand sleeve on a regular basis.  She's also been diagnosed in the past with a right upper extremity DVT; but discontinued Coumadin in 2014.  She is complaining now of increased right arm swelling, some occasional discomfort to the arm, and some erythema to right arm as well. She denies any known injury or trauma to the arm. She was seen for this same complaint approx 1 month ago; and was negative at that time for DVT.  She was prescribed Keflex for any questionable cellulitis at that time. Pt states that the erythema resolved when she took Keflex. On exam-marked lymphedema to entire right upper extremity; including the hand. So,e mild erythema to right arm above elbow.  No specific injury or trauma noted.    Doppler ultrasound obtained; which was negative for DVT.  Will once again prescribed Keflex antibiotics for the patient for any possibility of cellulitis.  Advised patient to continue with the lymphedema clinic 3 times per week for lymphedema massage and wrapping of the right arm as needed.   Patient takes Percocet 10/325 for pain.  Doppler ultrasound obtained today was negative for DVT.  Will prescribe Keflex for any trace cellulitis symptoms.  Also, called and obtained a lymphedema clinic appointment for the patient for tomorrow 01/21/2015 for lymphedema massage instruction.  Patient will also need a new compression sleeve for both her arm into her hand if at all possible.  Advised patient to call/return if she develops any new or worsening symptoms whatsoever.

## 2015-02-09 NOTE — Therapy (Signed)
[place], [place], [address] Phone: [phone]   Fax:  [phone]  Physical Therapy Treatment  Patient Details  Name: [name] MRN: [id number] Date of Birth: [birth year] Referring Provider:  [name], MD  Encounter Date: 02/09/2015      PT End of Session - 02/09/15 1707    Visit Number 5   Number of Visits 19   Date for PT Re-Evaluation 03/22/15   PT Start Time 0934   PT Stop Time 1016   PT Time Calculation (min) 42 min   Activity Tolerance Patient tolerated treatment well   Behavior During Therapy [facility] for tasks assessed/performed      Past Medical History  Diagnosis Date  . DVT (deep venous thrombosis) 10/07/2011  . Breast cancer   . Hypertension   . Rheumatic fever     Past Surgical History  Procedure Laterality Date  . Mastectomy      bilateral  . Breast surgery    . Port a cath revision      There were no vitals filed for this visit.  Visit Diagnosis:  Postmastectomy lymphedema  Stiffness of joint, shoulder region, right      Subjective Assessment - 02/09/15 0935    Subjective Hand got puffy.  She didn't get the wrap tight.   Currently in Pain? Yes   Pain Score 10-Worst pain ever   Pain Location Arm   Pain Orientation Right   Aggravating Factors  lifting the arm up, touching it   Pain Relieving Factors bandaging            [facility] PT Assessment - 02/09/15 0001    Observation/Other Assessments   Skin Integrity skin at anterior upper arm just proximal to elbow shows a swath of red: right arm is warm to the touch compared to right hand and compared to left; warmth lessened during session, but redness remained                   [facility] Adult PT Treatment/Exercise - 02/09/15 0001    Manual Therapy   Manual Lymphatic Drainage (MLD) In supine, short neck, superficial and deep abdomen, left axilla, right groin and axillo-inguinal anastomosis, and right  UE from fingers to shoulder.   Compression Bandaging no bandaging done today because of the possibility of an infection in the arm; gave patient two lengths of TG soft to wear for mild compression while not bandaged.                   Short Term Clinic Goals - 01/21/15 2025    CC Short Term Goal  #1   Title Rt. UE circumference at 15 cm. proximal to olecranon will reduce by at least 1.5 cm.   Baseline 42.6   Time 3   Period Weeks   Status New   CC Short Term Goal  #2   Title Rt. UE circumference at 15 cm. proximal to ulnar styloid will reduce by at least 2 cm.   Baseline 34.8   Time 3   Period Weeks   Status New             Long Term Clinic Goals - 01/28/15 1619    CC Long Term Goal  #4   Title Pt. will be independent in self-management of lymphedema, including manual lymph drainage.   Status On-going            Plan - 02/09/15 1708  Clinical Impression Statement Pt. with redness in warmth in right UE, so [place] was called about possible infection in that arm.  Patient did get an appointment today and went to [facility]; she called back later saying she does have an infection, so treatment will be suspended for a week as she takes antibiotics.   Pt will benefit from skilled therapeutic intervention in order to improve on the following deficits Increased edema;Other (comment)   Rehab Potential Good   Clinical Impairments Affecting Rehab Potential Current infection in right arm, so treatment on hold.   PT Frequency 3x / week   PT Duration 6 weeks   PT Treatment/Interventions Manual lymph drainage   PT Next Visit Plan On hold for one week, then resume complete decongestive therapy.   Consulted and Agree with Plan of Care Patient        Problem List Patient Active Problem List   Diagnosis Date Noted  . Left arm pain 08/18/2014  . Breast cancer of upper-inner quadrant of right female breast 10/03/2013  . Post-lymphadenectomy lymphedema of  arm 08/15/2013  . Port or reservoir infection 01/29/2013  . DVT (deep venous thrombosis) 10/07/2011  . Chest pain 09/10/2009  . Secondary cardiomyopathy 09/02/2009  . Essential hypertension 02/26/2009  . GERD 02/26/2009    SALISBURY,DONNA 02/09/2015, 5:12 PM  [place], [place], [address] Phone: [phone]   Fax:  [place], PT 02/09/2015 5:12 PM

## 2015-02-09 NOTE — Progress Notes (Signed)
SYMPTOM MANAGEMENT CLINIC   HPI: [name] 59 y.o. female diagnosed with breast cancer.  Patient is status post mastectomy and radiation therapy completed in 2003.  Patient last received chemotherapy in 2013.  Currently undergoing observation only.  Patient called the cancer Center today requesting urgent care visit.  She is complaining of increased right upper extremity lymphedema for the past several days.  She suffers with chronic lymphedema to this arm; but feels that it is much larger and more firm now.  She is also complaining of occasional pain to this arm; as well as one area of erythema.  She denies any known injury or trauma to her arm.  Patient was treated for the same complaint approximately one month ago.  At that time she was negative for DVT per Doppler; and was treated with Keflex antibiotics for any possibility of cellulitis.  Patient states that the Keflex resolved all erythema and warmth.  Patient has a history of a DVT in the same arm in the past; and completed Coumadin therapy in 2014.  Patient states she has been going to the lymphedema clinic faithfully 3 times per week.  She does  wear her compression sleeve to both her arm and her hand as well.  She denies any chest pain, chest pressure, shortness breath, or pain with inspiration.  Chills denies any recent fevers or chills.   HPI  ROS  Past Medical History  Diagnosis Date  . DVT (deep venous thrombosis) 10/07/2011  . Breast cancer   . Hypertension   . Rheumatic fever     Past Surgical History  Procedure Laterality Date  . Mastectomy      bilateral  . Breast surgery    . Port a cath revision      has Essential hypertension; Secondary cardiomyopathy; GERD; Chest pain; DVT (deep venous thrombosis); Port or reservoir infection; Post-lymphadenectomy lymphedema of arm; Breast cancer of upper-inner quadrant of right female breast; and Left arm pain on her problem list.    is allergic to tramadol and  hydrocodone-acetaminophen.    Medication List       This list is accurate as of: 02/09/15  1:55 PM.  Always use your most recent med list.               alprazolam 2 MG tablet  Commonly known as:  XANAX  Take 2 mg by mouth daily.     calcium-vitamin D 500-200 MG-UNIT per tablet  Commonly known as:  OSCAL WITH D  Take 1 tablet by mouth daily.     cephALEXin 500 MG capsule  Commonly known as:  KEFLEX  Take 1 capsule (500 mg total) by mouth 4 (four) times daily.     gabapentin 100 MG capsule  Commonly known as:  NEURONTIN  Take 1 capsule (100 mg total) by mouth at bedtime.     multivitamin capsule  Take 1 capsule by mouth daily.     Oxycodone HCl 10 MG Tabs  1 tab PO Q 6 hours PRN pain.  DO NOT TAKE ANY OTHER PAIN MEDS WHEN TAKING.     oxymorphone 10 MG tablet  Commonly known as:  OPANA  Take 1 tablet by mouth 3 (three) times daily as needed.     triamterene-hydrochlorothiazide 37.5-25 MG per tablet  Commonly known as:  MAXZIDE-25  TAKE 1 EACH (1 CAPSULE TOTAL) BY MOUTH EVERY MORNING.     vitamin C 100 MG tablet  Take 100 mg by mouth daily.  PHYSICAL EXAMINATION  Oncology Vitals 02/09/2015 01/19/2015 12/08/2014 10/20/2014 08/25/2014 08/18/2014 08/14/2014  Height - - 168 cm - 168 cm - 168 cm  Weight 78.926 kg - 81.738 kg - 82.237 kg 84.029 kg 81.647 kg  Weight (lbs) 174 lbs - 180 lbs 3 oz - 181 lbs 5 oz 185 lbs 4 oz 180 lbs  BMI (kg/m2) - - 29.08 kg/m2 - 29.26 kg/m2 - 29.05 kg/m2  Temp 98.5 97.8 98.2 98.4 98.6 - 98.6  Pulse 74 86 74 95 75 74 80  Resp 18 - 18 - '18 18 20  ' Resp (Historical as of 06/07/12) - - - - - - -  SpO2 - - - - - 99 95  BSA (m2) - - 1.95 m2 - 1.96 m2 - 1.95 m2   BP Readings from Last 3 Encounters:  02/09/15 107/57  01/19/15 115/82  12/08/14 102/66    Physical Exam  Constitutional: She is oriented to person, place, and time and well-developed, well-nourished, and in no distress.  HENT:  Head: Normocephalic and atraumatic.    Mouth/Throat: Oropharynx is clear and moist.  Eyes: Conjunctivae and EOM are normal. Pupils are equal, round, and reactive to light. Right eye exhibits no discharge. Left eye exhibits no discharge. No scleral icterus.  Neck: Normal range of motion. Neck supple.  Cardiovascular: Normal rate, regular rhythm, normal heart sounds and intact distal pulses.   Pulmonary/Chest: Effort normal and breath sounds normal. No respiratory distress. She has no wheezes. She has no rales. She exhibits no tenderness.  Abdominal: Soft. Bowel sounds are normal. She exhibits no distension and no mass. There is no tenderness. There is no rebound and no guarding.  Musculoskeletal: Normal range of motion. She exhibits edema. She exhibits no tenderness.  Right upper extremity with marked lymphedema to her entire arm and hand.  There is one area of erythema directly above the right elbow.  No obvious injury or trauma on exam.  Patient with full range of motion.  Neurological: She is alert and oriented to person, place, and time. Gait normal.  Skin: Skin is warm and dry. No rash noted. There is erythema.  See previous note regarding right upper extremity area of erythema.  Psychiatric: Affect normal.  Nursing note and vitals reviewed.   LABORATORY DATA:. No visits with results within 3 Day(s) from this visit. Latest known visit with results is:  Appointment on 01/20/2015  Component Date Value Ref Range Status  . WBC 01/20/2015 6.9  3.9 - 10.3 10e3/uL Final  . NEUT# 01/20/2015 4.9  1.5 - 6.5 10e3/uL Final  . HGB 01/20/2015 13.2  11.6 - 15.9 g/dL Final  . HCT 01/20/2015 40.4  34.8 - 46.6 % Final  . Platelets 01/20/2015 192  145 - 400 10e3/uL Final  . MCV 01/20/2015 88.0  79.5 - 101.0 fL Final  . MCH 01/20/2015 28.8  25.1 - 34.0 pg Final  . MCHC 01/20/2015 32.7  31.5 - 36.0 g/dL Final  . RBC 01/20/2015 4.59  3.70 - 5.45 10e6/uL Final  . RDW 01/20/2015 13.8  11.2 - 14.5 % Final  . lymph# 01/20/2015 1.3  0.9 - 3.3  10e3/uL Final  . MONO# 01/20/2015 0.4  0.1 - 0.9 10e3/uL Final  . Eosinophils Absolute 01/20/2015 0.2  0.0 - 0.5 10e3/uL Final  . Basophils Absolute 01/20/2015 0.0  0.0 - 0.1 10e3/uL Final  . NEUT% 01/20/2015 71.2  38.4 - 76.8 % Final  . LYMPH% 01/20/2015 19.4  14.0 - 49.7 % Final  .  MONO% 01/20/2015 6.4  0.0 - 14.0 % Final  . EOS% 01/20/2015 2.7  0.0 - 7.0 % Final  . BASO% 01/20/2015 0.3  0.0 - 2.0 % Final  . Sodium 01/20/2015 144  136 - 145 mEq/L Final  . Potassium 01/20/2015 3.6  3.5 - 5.1 mEq/L Final  . Chloride 01/20/2015 104  98 - 109 mEq/L Final  . CO2 01/20/2015 28  22 - 29 mEq/L Final  . Glucose 01/20/2015 121  70 - 140 mg/dl Final  . BUN 01/20/2015 10.0  7.0 - 26.0 mg/dL Final  . Creatinine 01/20/2015 0.8  0.6 - 1.1 mg/dL Final  . Total Bilirubin 01/20/2015 0.25  0.20 - 1.20 mg/dL Final  . Alkaline Phosphatase 01/20/2015 105  40 - 150 U/L Final  . AST 01/20/2015 34  5 - 34 U/L Final  . ALT 01/20/2015 21  0 - 55 U/L Final  . Total Protein 01/20/2015 7.1  6.4 - 8.3 g/dL Final  . Albumin 01/20/2015 3.7  3.5 - 5.0 g/dL Final  . Calcium 01/20/2015 9.5  8.4 - 10.4 mg/dL Final  . Anion Gap 01/20/2015 13* 3 - 11 mEq/L Final  . EGFR 01/20/2015 88* >90 ml/min/1.73 m2 Final   eGFR is calculated using the CKD-EPI Creatinine Equation (2009)   Doppler US:   Status: Signed       Expand All Collapse All   VASCULAR LAB PRELIMINARY PRELIMINARY PRELIMINARY PRELIMINARY  Right upper extremity venous duplex completed.   Preliminary report: Right : No evidence of DVT or superficial thrombosis. Moderate edema noted throughout the right upper extremity          RADIOGRAPHIC STUDIES: No results found.  ASSESSMENT/PLAN:    Post-lymphadenectomy lymphedema of arm Patient suffers with chronic right upper extremity lymphedema for the past several years.  She has been to the lymphedema clinic in the past; and wears a compression arm sleeve and hand sleeve on a regular basis.  She's  also been diagnosed in the past with a right upper extremity DVT; but discontinued Coumadin in 2014.  She is complaining now of increased right arm swelling, some occasional discomfort to the arm, and some erythema to right arm as well. She denies any known injury or trauma to the arm. She was seen for this same complaint approx 1 month ago; and was negative at that time for DVT.  She was prescribed Keflex for any questionable cellulitis at that time. Pt states that the erythema resolved when she took Keflex. On exam-marked lymphedema to entire right upper extremity; including the hand. So,e mild erythema to right arm above elbow.  No specific injury or trauma noted.    Doppler ultrasound obtained; which was negative for DVT.  Will once again prescribed Keflex antibiotics for the patient for any possibility of cellulitis.  Advised patient to continue with the lymphedema clinic 3 times per week for lymphedema massage and wrapping of the right arm as needed.   Patient takes Percocet 10/325 for pain.  Doppler ultrasound obtained today was negative for DVT.  Will prescribe Keflex for any trace cellulitis symptoms.  Also, called and obtained a lymphedema clinic appointment for the patient for tomorrow 01/21/2015 for lymphedema massage instruction.  Patient will also need a new compression sleeve for both her arm into her hand if at all possible.  Advised patient to call/return if she develops any new or worsening symptoms whatsoever.     Left arm pain Patient is complaining of some mild right upper extremity pain;  due to the increased edema and erythema.  Patient states that she has Opana pain medication; but does not typically take it because it is too strong for her.  She is requesting a refill of the oxycodone 10 mg tablets today.  Patient was given a prescription for the oxycodone 10 mg tablet; and advised not to take any other pain medication whatsoever when she is taking the oxycodone.  Patient was  also advised not to drink or drive while she is taking any pain medication.     Breast cancer of upper-inner quadrant of right female breast Patient is status post right breast mastectomy and radiation therapy completed in 2003.  Patient completed her last chemotherapy in 2013.  She is currently undergoing observation only.  Patient has plans to return on 03/16/2015 for labs and a follow-up visit.  She knows to call in the interim with any new worries or concerns.      Patient stated understanding of all instructions; and was in agreement with this plan of care. The patient knows to call the clinic with any problems, questions or concerns.   Review/collaboration with Dr. Jana Hakim regarding all aspects of patient's visit today.   Total time spent with patient was 40 minutes;  with greater than 75 percent of that time spent in face to face counseling regarding patient's symptoms,  and coordination of care and follow up.  Disclaimer: This note was dictated with voice recognition software. Similar sounding words can inadvertently be transcribed and may not be corrected upon review.   Drue Second, NP 02/09/2015

## 2015-02-09 NOTE — Assessment & Plan Note (Signed)
Patient is complaining of some mild right upper extremity pain; due to the increased edema and erythema.  Patient states that she has Opana pain medication; but does not typically take it because it is too strong for her.  She is requesting a refill of the oxycodone 10 mg tablets today.  Patient was given a prescription for the oxycodone 10 mg tablet; and advised not to take any other pain medication whatsoever when she is taking the oxycodone.  Patient was also advised not to drink or drive while she is taking any pain medication.

## 2015-02-09 NOTE — Assessment & Plan Note (Signed)
Patient is status post right breast mastectomy and radiation therapy completed in 2003.  Patient completed her last chemotherapy in 2013.  She is currently undergoing observation only.  Patient has plans to return on 03/16/2015 for labs and a follow-up visit.  She knows to call in the interim with any new worries or concerns.

## 2015-02-11 ENCOUNTER — Encounter: Payer: Commercial Managed Care - PPO | Admitting: Physical Therapy

## 2015-02-12 ENCOUNTER — Telehealth: Payer: Self-pay

## 2015-02-12 NOTE — Telephone Encounter (Signed)
Pt called states some swelling still remain to her right arm but states she is taking her diuretic and ABT-Keflex, pt denies pain,  stated  she has an  upcoming appointment with PT. She was reminded of her upcoming appointment with Dr. Marjie Skiff and also lab appointment her at the cancer center.

## 2015-02-13 ENCOUNTER — Encounter: Payer: Commercial Managed Care - PPO | Admitting: Physical Therapy

## 2015-02-18 ENCOUNTER — Ambulatory Visit: Payer: Commercial Managed Care - PPO | Admitting: Physical Therapy

## 2015-02-18 DIAGNOSIS — I972 Postmastectomy lymphedema syndrome: Secondary | ICD-10-CM

## 2015-02-18 DIAGNOSIS — M25611 Stiffness of right shoulder, not elsewhere classified: Secondary | ICD-10-CM | POA: Diagnosis not present

## 2015-02-18 NOTE — Therapy (Signed)
[place], [place], [address] Phone: [phone]   Fax:  [phone]  Physical Therapy Treatment  Patient Details  Name: [name] MRN: [id number] Date of Birth: May 27, 1956 Referring Provider:  [name], MD  Encounter Date: 02/18/2015      PT End of Session - 02/18/15 1217    Visit Number 6   Number of Visits 19   Date for PT Re-Evaluation 03/22/15   PT Start Time 1062   PT Stop Time 1103   PT Time Calculation (min) 42 min      Past Medical History  Diagnosis Date  . DVT (deep venous thrombosis) 10/07/2011  . Breast cancer   . Hypertension   . Rheumatic fever     Past Surgical History  Procedure Laterality Date  . Mastectomy      bilateral  . Breast surgery    . Port a cath revision      There were no vitals filed for this visit.  Visit Diagnosis:  Postmastectomy lymphedema  Stiffness of joint, shoulder region, right      Subjective Assessment - 02/18/15 1025    Subjective "My arm is still the same."  Had negative Doppler, was put on antibiotics and now has three pills left.  Feels arm is still red and is uncomfortable.   Currently in Pain? Yes   Pain Score 8    Pain Location Arm   Pain Orientation Right   Pain Descriptors / Indicators Throbbing   Aggravating Factors  nothing   Pain Relieving Factors bandaging               LYMPHEDEMA/ONCOLOGY QUESTIONNAIRE - 02/18/15 1027    Right Upper Extremity Lymphedema   15 cm Proximal to Olecranon Process 43 cm   10 cm Proximal to Olecranon Process 41.1 cm   Olecranon Process 41.4 cm   15 cm Proximal to Ulnar Styloid Process 37.1 cm   10 cm Proximal to Ulnar Styloid Process 29.3 cm   Just Proximal to Ulnar Styloid Process 25 cm   Across Hand at [employer] 23.2 cm   At [place] of 2nd Digit 7.8 cm                [facility] Adult PT Treatment/Exercise - 02/18/15 0001    Manual Therapy   Manual Therapy  Edema management   Edema Management circumference measurements taken   Manual Lymphatic Drainage (MLD) In supine, short neck, diaphragmatic breathing, superficial abdomen, left axilla, right groin and axillo-inguinal anastomosis, and right UE from fingers to shoulder.   Compression Bandaging Lotion applied, stockinette, Elastomull to all 5 fingers, peach Medi small dot foam around upper arm (especially posterior aspect), Artiflex x 2, 1-6, 1-10 and 2-12 cm short stretch compression banadages from hand to axilla.                 PT Education - 02/18/15 1217    Education provided Yes   Education Details check for fever, increased discomfort, warmth, and/or redness of right arm as signs of infection that is ongoing or worsening; call doctor if so   Person(s) Educated Patient   Methods Explanation   Comprehension Verbalized understanding           Short Term Clinic Goals - 02/18/15 1221    CC Short Term Goal  #1   Baseline 43 on 02/18/15 after being out of bandages while taking antibiotic for infection   Status On-going   CC  Short Term Goal  #2   Baseline 37.1 on 02/18/15 after week of infection   Status On-going             Long Term Clinic Goals - 01/28/15 1619    CC Long Term Goal  #4   Title Pt. will be independent in self-management of lymphedema, including manual lymph drainage.   Status On-going            Plan - 02/18/15 1218    Clinical Impression Statement Pt. still with slight redness and warmth in right UE, but has been on antibiotic for 7 days for possible cellulitis.  Arm  has increased significantly in circumference measurements, fibrosis, and pt.'s discomfort.   Pt will benefit from skilled therapeutic intervention in order to improve on the following deficits Increased edema;Decreased knowledge of precautions;Impaired UE functional use   Rehab Potential Good   PT Frequency 3x / week   PT Duration 6 weeks   PT Treatment/Interventions Manual lymph  drainage;Compression bandaging;Patient/family education   PT Next Visit Plan continue complete decongestive therapy for right UE   PT Home Exercise Plan pt. was encouraged to do remedial ROM for lymphedema while in bandages   Consulted and Agree with Plan of Care Patient        Problem List Patient Active Problem List   Diagnosis Date Noted  . Left arm pain 08/18/2014  . Breast cancer of upper-inner quadrant of right female breast 10/03/2013  . Post-lymphadenectomy lymphedema of arm 08/15/2013  . Port or reservoir infection 01/29/2013  . DVT (deep venous thrombosis) 10/07/2011  . Chest pain 09/10/2009  . Secondary cardiomyopathy 09/02/2009  . Essential hypertension 02/26/2009  . GERD 02/26/2009    Makael Stein 02/18/2015, 12:24 PM  [place], [place], [address] Phone: [phone]   Fax:  [place], PT 02/18/2015 12:24 PM

## 2015-02-20 ENCOUNTER — Ambulatory Visit: Payer: Commercial Managed Care - PPO | Admitting: Physical Therapy

## 2015-02-20 DIAGNOSIS — M25611 Stiffness of right shoulder, not elsewhere classified: Secondary | ICD-10-CM | POA: Diagnosis not present

## 2015-02-20 DIAGNOSIS — I972 Postmastectomy lymphedema syndrome: Secondary | ICD-10-CM | POA: Diagnosis not present

## 2015-02-20 NOTE — Therapy (Signed)
[place], [place], [address] Phone: [phone]   Fax:  [phone]  Physical Therapy Treatment  Patient Details  Name: [name] MRN: [id number] Date of Birth: 12/04/1955 Referring Provider:  Chauncey Cruel, MD  Encounter Date: 02/20/2015      PT End of Session - 02/20/15 1244    Visit Number 7   Number of Visits 19   Date for PT Re-Evaluation 03/22/15   PT Start Time 1110   PT Stop Time 1155   PT Time Calculation (min) 45 min   Activity Tolerance Patient tolerated treatment well   Behavior During Therapy [facility] for tasks assessed/performed      Past Medical History  Diagnosis Date  . DVT (deep venous thrombosis) 10/07/2011  . Breast cancer   . Hypertension   . Rheumatic fever     Past Surgical History  Procedure Laterality Date  . Mastectomy      bilateral  . Breast surgery    . Port a cath revision      There were no vitals filed for this visit.  Visit Diagnosis:  Postmastectomy lymphedema      Subjective Assessment - 02/20/15 1110    Subjective Bandages drooped this morning so I took them off and took a shower.  Arm feels better already.   Currently in Pain? Yes   Pain Score 5    Pain Location Finger (Comment which one)  all knuckles   Pain Orientation Right   Aggravating Factors  swelling   Effect of Pain on Daily Activities bandaging            [facility] PT Assessment - 02/20/15 0001    Observation/Other Assessments   Skin Integrity skin looks much better, less red; arm swelling is visibly less                   [facility] Adult PT Treatment/Exercise - 02/20/15 0001    Manual Therapy   Manual Lymphatic Drainage (MLD) In supine, short neck, diaphragmatic breathing, superficial abdomen, left axilla and anterior interaxillary anastomosis, right groin and axillo-inguinal anastomosis, and right UE from fingers to shoulder.   Compression Bandaging Lotion  applied, stockinette, Elastomull to all 5 fingers, peach Medi small dot foam around upper arm (especially posterior aspect), Artiflex x 2, 1-6, 1-10 and 2-12 cm short stretch compression banadages from hand to axilla.                    Short Term Clinic Goals - 02/18/15 1221    CC Short Term Goal  #1   Baseline 43 on 02/18/15 after being out of bandages while taking antibiotic for infection   Status On-going   CC Short Term Goal  #2   Baseline 37.1 on 02/18/15 after week of infection   Status On-going             Long Term Clinic Goals - 01/28/15 1619    CC Long Term Goal  #4   Title Pt. will be independent in self-management of lymphedema, including manual lymph drainage.   Status On-going            Plan - 02/20/15 1244    Clinical Impression Statement Arm looks better today with less redness and less swelling.  Pt. also feels better today.   Pt will benefit from skilled therapeutic intervention in order to improve on the following deficits Increased edema;Decreased knowledge of precautions;Impaired UE functional use  Rehab Potential Good   PT Treatment/Interventions Manual lymph drainage;Compression bandaging;Patient/family education   PT Next Visit Plan continue complete decongestive therapy for right UE        Problem List Patient Active Problem List   Diagnosis Date Noted  . Left arm pain 08/18/2014  . Breast cancer of upper-inner quadrant of right female breast 10/03/2013  . Post-lymphadenectomy lymphedema of arm 08/15/2013  . Port or reservoir infection 01/29/2013  . DVT (deep venous thrombosis) 10/07/2011  . Chest pain 09/10/2009  . Secondary cardiomyopathy 09/02/2009  . Essential hypertension 02/26/2009  . GERD 02/26/2009    Rhyanna Sorce 02/20/2015, 12:46 PM  [place], [place], [address] Phone: [phone]   Fax:  [place],  PT 02/20/2015 12:46 PM

## 2015-02-23 ENCOUNTER — Ambulatory Visit: Payer: Commercial Managed Care - PPO | Admitting: Physical Therapy

## 2015-02-23 DIAGNOSIS — I972 Postmastectomy lymphedema syndrome: Secondary | ICD-10-CM

## 2015-02-23 DIAGNOSIS — M25611 Stiffness of right shoulder, not elsewhere classified: Secondary | ICD-10-CM | POA: Diagnosis not present

## 2015-02-23 NOTE — Therapy (Signed)
[place], [place], [address] Phone: [phone]   Fax:  [phone]  Physical Therapy Treatment  Patient Details  Name: [name] MRN: [id number] Date of Birth: 1956-05-27 Referring Provider:  Chauncey Cruel, MD  Encounter Date: 02/23/2015      PT End of Session - 02/23/15 1410    Visit Number 8   Number of Visits 19   Date for PT Re-Evaluation 03/22/15   PT Start Time 0932   PT Stop Time 1017   PT Time Calculation (min) 45 min   Activity Tolerance Patient tolerated treatment well   Behavior During Therapy [facility] for tasks assessed/performed      Past Medical History  Diagnosis Date  . DVT (deep venous thrombosis) 10/07/2011  . Breast cancer   . Hypertension   . Rheumatic fever     Past Surgical History  Procedure Laterality Date  . Mastectomy      bilateral  . Breast surgery    . Port a cath revision      There were no vitals filed for this visit.  Visit Diagnosis:  Postmastectomy lymphedema      Subjective Assessment - 02/23/15 0934    Subjective Bandages stayed on till yesterday morning.  they drooped.   Currently in Pain? Yes   Pain Score 3    Pain Location Hand  and forearm   Pain Orientation Right   Pain Descriptors / Indicators Sore                         [facility] Adult PT Treatment/Exercise - 02/23/15 0001    Manual Therapy   Manual Lymphatic Drainage (MLD) In supine, short neck, superficial and deep abdomen, left axilla and anterior interaxillary anastomosis, right groin and axillo-inguinal anastomosis, and right UE from fingers to shoulder.   Compression Bandaging Lotion applied, stockinette, Elastomull to all 5 fingers, peach Medi small dot foam around upper arm (especially posterior aspect), peach Medi lined foam around part of posterior foream, Artiflex x 2, 1-6, 1-10 and 2-12 cm short stretch compression banadages from hand to axilla.                     Short Term Clinic Goals - 02/18/15 1221    CC Short Term Goal  #1   Baseline 43 on 02/18/15 after being out of bandages while taking antibiotic for infection   Status On-going   CC Short Term Goal  #2   Baseline 37.1 on 02/18/15 after week of infection   Status On-going             Long Term Clinic Goals - 01/28/15 1619    CC Long Term Goal  #4   Title Pt. will be independent in self-management of lymphedema, including manual lymph drainage.   Status On-going            Plan - 02/23/15 1411    Clinical Impression Statement Arm still very firm with fibrosis throughout, worse at upper postero-medial aspect.   Pt will benefit from skilled therapeutic intervention in order to improve on the following deficits Increased edema;Decreased knowledge of precautions;Impaired UE functional use   Rehab Potential Good   PT Treatment/Interventions Manual lymph drainage;Compression bandaging   PT Next Visit Plan remeasure arm and check goals; continue complete decongestive therapy for right UE        Problem List Patient Active Problem List   Diagnosis Date  Noted  . Left arm pain 08/18/2014  . Breast cancer of upper-inner quadrant of right female breast 10/03/2013  . Post-lymphadenectomy lymphedema of arm 08/15/2013  . Port or reservoir infection 01/29/2013  . DVT (deep venous thrombosis) 10/07/2011  . Chest pain 09/10/2009  . Secondary cardiomyopathy 09/02/2009  . Essential hypertension 02/26/2009  . GERD 02/26/2009    SALISBURY,DONNA 02/23/2015, 2:13 PM  [place], [place], [address] Phone: [phone]   Fax:  [place], PT 02/23/2015 2:13 PM

## 2015-02-25 ENCOUNTER — Ambulatory Visit: Payer: Commercial Managed Care - PPO | Admitting: Physical Therapy

## 2015-02-25 DIAGNOSIS — I972 Postmastectomy lymphedema syndrome: Secondary | ICD-10-CM | POA: Diagnosis not present

## 2015-02-25 DIAGNOSIS — M25611 Stiffness of right shoulder, not elsewhere classified: Secondary | ICD-10-CM | POA: Diagnosis not present

## 2015-02-25 NOTE — Therapy (Signed)
[place], [place], [address] Phone: [phone]   Fax:  [phone]  Physical Therapy Treatment  Patient Details  Name: [name] MRN: [id number] Date of Birth: [birth year] Referring Provider:  [name], MD  Encounter Date: 02/25/2015      PT End of Session - 02/25/15 1327    Visit Number 9   Number of Visits 19   Date for PT Re-Evaluation 03/22/15   PT Start Time 1021   PT Stop Time 1112   PT Time Calculation (min) 51 min   Activity Tolerance Patient tolerated treatment well   Behavior During Therapy [facility] for tasks assessed/performed      Past Medical History  Diagnosis Date  . DVT (deep venous thrombosis) 10/07/2011  . Breast cancer   . Hypertension   . Rheumatic fever     Past Surgical History  Procedure Laterality Date  . Mastectomy      bilateral  . Breast surgery    . Port a cath revision      There were no vitals filed for this visit.  Visit Diagnosis:  Postmastectomy lymphedema      Subjective Assessment - 02/25/15 1026    Subjective Just took bandages off in the lobby.  Took off one last night that was drooping down.   Currently in Pain? No/denies               LYMPHEDEMA/ONCOLOGY QUESTIONNAIRE - 02/25/15 1027    Right Upper Extremity Lymphedema   15 cm Proximal to Olecranon Process 40.7 cm   10 cm Proximal to Olecranon Process 38.5 cm   Olecranon Process 36.8 cm   15 cm Proximal to Ulnar Styloid Process 34.9 cm   10 cm Proximal to Ulnar Styloid Process 29 cm   Just Proximal to Ulnar Styloid Process 20.9 cm   Across Hand at [employer] 20.2 cm   At [place] of 2nd Digit 6.6 cm                  [facility] Adult PT Treatment/Exercise - 02/25/15 0001    Manual Therapy   Edema Management circumference measurements taken   Manual Lymphatic Drainage (MLD) In supine, short neck, left axilla and anterior interaxillary anastomosis, right groin and  axillo-inguinal anastomosis, and right UE from fingers to shoulder.   Compression Bandaging Lotion applied, stockinette, Elastomull to all 5 fingers, peach Medi small dot foam around upper arm (especially posterior aspect), peach Medi lined foam around part of posterior foream, Artiflex x 2, 1-6, 1-10 and 2-12 cm short stretch compression banadages from hand to axilla.                    Short Term Clinic Goals - 02/25/15 1330    CC Short Term Goal  #1   Baseline 42.6 at initial eval; 40.7 cm. 02/25/15   Status Achieved   CC Short Term Goal  #2   Baseline 34.8 cm. at initial eval; 34.9 cm. on 02/25/15   Status On-going             [facility] Goals - 02/25/15 1332    CC Long Term Goal  #1   Status On-going   CC Long Term Goal  #2   Status On-going            Plan - 02/25/15 1328    Clinical Impression Statement Reductions in upper arm in particular this week.  Still very firm  fibrosis throughout.   Pt will benefit from skilled therapeutic intervention in order to improve on the following deficits Increased edema;Decreased knowledge of precautions;Impaired UE functional use   Rehab Potential Good   PT Frequency 3x / week   PT Duration 6 weeks   PT Treatment/Interventions Manual lymph drainage;Compression bandaging   PT Next Visit Plan continue complete decongestive therapy for right arm   Recommended Other Services When ready to be measured, patient wants to get garments through [facility] and says she has [employer].   Consulted and Agree with Plan of Care Patient        Problem List Patient Active Problem List   Diagnosis Date Noted  . Left arm pain 08/18/2014  . Breast cancer of upper-inner quadrant of right female breast 10/03/2013  . Post-lymphadenectomy lymphedema of arm 08/15/2013  . Port or reservoir infection 01/29/2013  . DVT (deep venous thrombosis) 10/07/2011  . Chest pain 09/10/2009  . Secondary cardiomyopathy 09/02/2009  .  Essential hypertension 02/26/2009  . GERD 02/26/2009    SALISBURY,DONNA 02/25/2015, 1:33 PM  [name], [place], [address] Phone: [phone]   Fax:  [place], PT 02/25/2015 1:33 PM

## 2015-02-27 ENCOUNTER — Ambulatory Visit: Payer: Commercial Managed Care - PPO | Admitting: Physical Therapy

## 2015-02-27 DIAGNOSIS — I972 Postmastectomy lymphedema syndrome: Secondary | ICD-10-CM

## 2015-02-27 DIAGNOSIS — M25611 Stiffness of right shoulder, not elsewhere classified: Secondary | ICD-10-CM | POA: Diagnosis not present

## 2015-02-27 NOTE — Therapy (Signed)
[place], [place], [address] Phone: [phone]   Fax:  [phone]  Physical Therapy Treatment  Patient Details  Name: [name] MRN: [id number] Date of Birth: [birth year] Referring Provider:  Chauncey Cruel, MD  Encounter Date: 13-Mar-2015      PT End of Session - 2015-03-13 1228    Visit Number 10   Number of Visits 19   Date for PT Re-Evaluation 03/22/15   PT Start Time 1109   PT Stop Time 1148   PT Time Calculation (min) 39 min   Activity Tolerance Patient tolerated treatment well   Behavior During Therapy [facility] for tasks assessed/performed      Past Medical History  Diagnosis Date  . DVT (deep venous thrombosis) 10/07/2011  . Breast cancer   . Hypertension   . Rheumatic fever     Past Surgical History  Procedure Laterality Date  . Mastectomy      bilateral  . Breast surgery    . Port a cath revision      There were no vitals filed for this visit.  Visit Diagnosis:  Postmastectomy lymphedema      Subjective Assessment - 03/13/2015 1112    Subjective Bandages drooped the day I was here; I took them off yesterday to take a bath, and put the sleeve on.   Currently in Pain? No/denies                         [facility] Adult PT Treatment/Exercise - 13-Mar-2015 0001    Manual Therapy   Manual Lymphatic Drainage (MLD) In supine, short neck, left axilla and anterior interaxillary anastomosis, right groin and axillo-inguinal anastomosis, and right UE from fingers to shoulder.   Compression Bandaging Lotion applied, stockinette, Elastomull to all 5 fingers, peach Medi large dot foam around upper arm (especially posterior aspect), peach Medi small dot foam around posterior foream, Artiflex x 2, 1-6, 1-8 and 2-12 cm short stretch compression banadages from hand to axilla.                    Short Term Clinic Goals - 02/25/15 1330    CC Short Term Goal  #1   Baseline 42.6 at initial eval; 40.7 cm. 02/25/15   Status Achieved   CC Short Term Goal  #2   Baseline 34.8 cm. at initial eval; 34.9 cm. on 02/25/15   Status On-going             [facility] Goals - 02/25/15 1332    CC Long Term Goal  #1   Status On-going   CC Long Term Goal  #2   Status On-going            Plan - Mar 13, 2015 1228    Clinical Impression Statement Arm looks better today, with slight softening of fibrosis in forearm.   Pt will benefit from skilled therapeutic intervention in order to improve on the following deficits Increased edema;Decreased knowledge of precautions;Impaired UE functional use   Rehab Potential Good   PT Frequency 3x / week   PT Duration 6 weeks   PT Treatment/Interventions Manual lymph drainage;Compression bandaging   PT Next Visit Plan continue complete decongestive therapy for right arm   Consulted and Agree with Plan of Care Patient          G-Codes - 03-13-2015 1231    Functional Assessment Tool Used clinical judgement   Functional Limitation Carrying,  moving and handling objects   Carrying, Moving and Handling Objects Current Status [phone]) At least 20 percent but less than 40 percent impaired, limited or restricted   Carrying, Moving and Handling Objects Goal Status ([id number]) At least 20 percent but less than 40 percent impaired, limited or restricted      Problem List Patient Active Problem List   Diagnosis Date Noted  . Left arm pain 08/18/2014  . Breast cancer of upper-inner quadrant of right female breast 10/03/2013  . Post-lymphadenectomy lymphedema of arm 08/15/2013  . Port or reservoir infection 01/29/2013  . DVT (deep venous thrombosis) 10/07/2011  . Chest pain 09/10/2009  . Secondary cardiomyopathy 09/02/2009  . Essential hypertension 02/26/2009  . GERD 02/26/2009    Edithe Dobbin 02/27/2015, 12:32 PM  [place], [place],  [address] Phone: [phone]   Fax:  [place], PT 02/27/2015 12:32 PM

## 2015-03-02 ENCOUNTER — Other Ambulatory Visit (HOSPITAL_BASED_OUTPATIENT_CLINIC_OR_DEPARTMENT_OTHER): Payer: Commercial Managed Care - PPO

## 2015-03-02 ENCOUNTER — Telehealth: Payer: Self-pay | Admitting: *Deleted

## 2015-03-02 ENCOUNTER — Ambulatory Visit: Payer: Commercial Managed Care - PPO | Admitting: Physical Therapy

## 2015-03-02 ENCOUNTER — Other Ambulatory Visit: Payer: Self-pay | Admitting: *Deleted

## 2015-03-02 ENCOUNTER — Ambulatory Visit (HOSPITAL_BASED_OUTPATIENT_CLINIC_OR_DEPARTMENT_OTHER): Payer: Commercial Managed Care - PPO

## 2015-03-02 VITALS — BP 118/70 | HR 78 | Temp 98.1°F

## 2015-03-02 DIAGNOSIS — C50211 Malignant neoplasm of upper-inner quadrant of right female breast: Secondary | ICD-10-CM

## 2015-03-02 DIAGNOSIS — M25611 Stiffness of right shoulder, not elsewhere classified: Secondary | ICD-10-CM | POA: Diagnosis not present

## 2015-03-02 DIAGNOSIS — I972 Postmastectomy lymphedema syndrome: Secondary | ICD-10-CM

## 2015-03-02 DIAGNOSIS — Z95828 Presence of other vascular implants and grafts: Secondary | ICD-10-CM

## 2015-03-02 LAB — COMPREHENSIVE METABOLIC PANEL (CC13)
ALT: 17 U/L (ref 0–55)
AST: 29 U/L (ref 5–34)
Albumin: 3.9 g/dL (ref 3.5–5.0)
Alkaline Phosphatase: 111 U/L (ref 40–150)
Anion Gap: 13 mEq/L — ABNORMAL HIGH (ref 3–11)
BUN: 17 mg/dL (ref 7.0–26.0)
CO2: 26 mEq/L (ref 22–29)
Calcium: 9.8 mg/dL (ref 8.4–10.4)
Chloride: 101 mEq/L (ref 98–109)
Creatinine: 1 mg/dL (ref 0.6–1.1)
EGFR: 74 mL/min/{1.73_m2} — ABNORMAL LOW (ref >90)
Glucose: 116 mg/dl (ref 70–140)
Potassium: 3.7 mEq/L (ref 3.5–5.1)
Sodium: 140 mEq/L (ref 136–145)
Total Bilirubin: 0.22 mg/dL (ref 0.20–1.20)
Total Protein: 7.4 g/dL (ref 6.4–8.3)

## 2015-03-02 LAB — CBC WITH DIFFERENTIAL/PLATELET
BASO%: 0.3 % (ref 0.0–2.0)
Basophils Absolute: 0 10*3/uL (ref 0.0–0.1)
EOS%: 0.6 % (ref 0.0–7.0)
Eosinophils Absolute: 0 10*3/uL (ref 0.0–0.5)
HCT: 39.5 % (ref 34.8–46.6)
HGB: 13.2 g/dL (ref 11.6–15.9)
LYMPH%: 28.2 % (ref 14.0–49.7)
MCH: 28.9 pg (ref 25.1–34.0)
MCHC: 33.4 g/dL (ref 31.5–36.0)
MCV: 86.6 fL (ref 79.5–101.0)
MONO#: 0.4 10*3/uL (ref 0.1–0.9)
MONO%: 6.3 % (ref 0.0–14.0)
NEUT#: 4.1 10*3/uL (ref 1.5–6.5)
NEUT%: 64.6 % (ref 38.4–76.8)
Platelets: 234 10*3/uL (ref 145–400)
RBC: 4.56 10*6/uL (ref 3.70–5.45)
RDW: 13.5 % (ref 11.2–14.5)
WBC: 6.3 10*3/uL (ref 3.9–10.3)
lymph#: 1.8 10*3/uL (ref 0.9–3.3)

## 2015-03-02 MED ORDER — HEPARIN SOD (PORK) LOCK FLUSH 100 UNIT/ML IV SOLN
500.0000 [IU] | Freq: Once | INTRAVENOUS | Status: AC
  Administered 2015-03-02: 500 [IU] via INTRAVENOUS
  Filled 2015-03-02: qty 5

## 2015-03-02 MED ORDER — SODIUM CHLORIDE 0.9 % IJ SOLN
10.0000 mL | INTRAMUSCULAR | Status: DC | PRN
  Administered 2015-03-02: 10 mL via INTRAVENOUS
  Filled 2015-03-02: qty 10

## 2015-03-02 NOTE — Patient Instructions (Signed)

## 2015-03-02 NOTE — Therapy (Signed)
[place], [place], [address] Phone: [phone]   Fax:  [phone]  Physical Therapy Treatment  Patient Details  Name: [name] MRN: [id number] Date of Birth: 07/05/1956 Referring Provider:  [name], MD  Encounter Date: 03/02/2015      PT End of Session - 03/02/15 1251    Visit Number 11   Number of Visits 19   Date for PT Re-Evaluation 03/22/15   PT Start Time 0939   PT Stop Time 1019   PT Time Calculation (min) 40 min   Activity Tolerance Patient tolerated treatment well   Behavior During Therapy [facility] for tasks assessed/performed      Past Medical History  Diagnosis Date  . DVT (deep venous thrombosis) 10/07/2011  . Breast cancer   . Hypertension   . Rheumatic fever     Past Surgical History  Procedure Laterality Date  . Mastectomy      bilateral  . Breast surgery    . Port a cath revision      There were no vitals filed for this visit.  Visit Diagnosis:  Postmastectomy lymphedema      Subjective Assessment - 03/02/15 0936    Subjective "My arm is back."  Feels the arm is reduced back to where it was.  Patient expressing concern today about a number of people commenting on her weight loss.   Currently in Pain? No/denies                         [facility] Adult PT Treatment/Exercise - 03/02/15 0001    Manual Therapy   Manual Lymphatic Drainage (MLD) In supine, short neck, superficial and deep abdomen, left axilla and anterior interaxillary anastomosis, right groin and axillo-inguinal anastomosis, and right UE from fingers to shoulder.   Compression Bandaging Lotion applied, stockinette, Elastomull to all 5 fingers, peach Medi large dot foam around upper arm (especially posterior aspect), peach Medi small dot foam around posterior foream, Artiflex x 2, 1-6, 1-8 and 2-12 cm short stretch compression banadages from hand to axilla.                    Short Term Clinic Goals - 02/25/15 1330    CC Short Term Goal  #1   Baseline 42.6 at initial eval; 40.7 cm. 02/25/15   Status Achieved   CC Short Term Goal  #2   Baseline 34.8 cm. at initial eval; 34.9 cm. on 02/25/15   Status On-going             [facility] Goals - 02/25/15 1332    CC Long Term Goal  #1   Status On-going   CC Long Term Goal  #2   Status On-going            Plan - 03/02/15 1251    Clinical Impression Statement Arms continues to look better in terms of size and color, but still very hardened tissues   Pt will benefit from skilled therapeutic intervention in order to improve on the following deficits Increased edema;Decreased knowledge of precautions;Impaired UE functional use   Rehab Potential Good   PT Next Visit Plan send prescription for signature for sleeve, remeasure arm; continue complete decongestive therapy   Recommended Other Services Patient is to ask at [facility] today about her coverage for garments.        Problem List Patient Active Problem List   Diagnosis Date Noted  .  Left arm pain 08/18/2014  . Breast cancer of upper-inner quadrant of right female breast 10/03/2013  . Post-lymphadenectomy lymphedema of arm 08/15/2013  . Port or reservoir infection 01/29/2013  . DVT (deep venous thrombosis) 10/07/2011  . Chest pain 09/10/2009  . Secondary cardiomyopathy 09/02/2009  . Essential hypertension 02/26/2009  . GERD 02/26/2009    Maram Bently 03/02/2015, 12:54 PM  [place], [place], [address] Phone: [phone]   Fax:  [phone]   .Serafina Royals, PT 03/02/2015 12:54 PM

## 2015-03-02 NOTE — Telephone Encounter (Signed)
VM message received from patient, stating that she has appt. Today for lab flush @ 1:30 pm but is concerned that she has lost a lot of weight. No other information provided.  Attempted call back prior to today's appt..  Got pt's vm. LVM for pt to return call when she is able.

## 2015-03-04 ENCOUNTER — Ambulatory Visit: Payer: Commercial Managed Care - PPO | Admitting: Physical Therapy

## 2015-03-04 ENCOUNTER — Encounter: Payer: Self-pay | Admitting: Oncology

## 2015-03-04 DIAGNOSIS — I972 Postmastectomy lymphedema syndrome: Secondary | ICD-10-CM

## 2015-03-04 DIAGNOSIS — H2513 Age-related nuclear cataract, bilateral: Secondary | ICD-10-CM | POA: Diagnosis not present

## 2015-03-04 DIAGNOSIS — H40033 Anatomical narrow angle, bilateral: Secondary | ICD-10-CM | POA: Diagnosis not present

## 2015-03-04 DIAGNOSIS — M25611 Stiffness of right shoulder, not elsewhere classified: Secondary | ICD-10-CM | POA: Diagnosis not present

## 2015-03-04 NOTE — Therapy (Signed)
[place], [place], [address] Phone: [phone]   Fax:  [phone]  Physical Therapy Treatment  Patient Details  Name: [name] MRN: [id number] Date of Birth: [birth year] Referring Provider:  [name], MD  Encounter Date: 03/04/2015      PT End of Session - 03/04/15 1246    Visit Number 12   Number of Visits 19   Date for PT Re-Evaluation 03/22/15   PT Start Time 1110   PT Stop Time 1159   PT Time Calculation (min) 49 min   Activity Tolerance Patient tolerated treatment well   Behavior During Therapy [facility] for tasks assessed/performed      Past Medical History  Diagnosis Date  . DVT (deep venous thrombosis) 10/07/2011  . Breast cancer   . Hypertension   . Rheumatic fever     Past Surgical History  Procedure Laterality Date  . Mastectomy      bilateral  . Breast surgery    . Port a cath revision      There were no vitals filed for this visit.  Visit Diagnosis:  Postmastectomy lymphedema      Subjective Assessment - 03/04/15 1112    Subjective "I'm good today."  Took bandages off about four hours ago.   Currently in Pain? No/denies  just numbness in my hand               LYMPHEDEMA/ONCOLOGY QUESTIONNAIRE - 03/04/15 1113    Right Upper Extremity Lymphedema   15 cm Proximal to Olecranon Process 40 cm   10 cm Proximal to Olecranon Process 38.9 cm   Olecranon Process 37.2 cm   15 cm Proximal to Ulnar Styloid Process 34.6 cm   10 cm Proximal to Ulnar Styloid Process 29 cm   Just Proximal to Ulnar Styloid Process 21.5 cm   Across Hand at [employer] 20.8 cm   At [place] of 2nd Digit 6.5 cm   Other bandages have been off for four hours                  [facility] Adult PT Treatment/Exercise - 03/04/15 0001    Manual Therapy   Edema Management circumference measurements taken   Manual Lymphatic Drainage (MLD) In supine, short neck, left axilla and  anterior interaxillary anastomosis, right groin and axillo-inguinal anastomosis, and right UE from fingers to shoulder.   Compression Bandaging Lotion applied, stockinette, Elastomull to all 5 fingers, peach Medi large dot foam around upper arm (especially posterior aspect), peach Medi small dot foam around posterior foream, Artiflex x 2, 1-6, 1-8 and 2-12 cm short stretch compression banadages from hand to axilla.                    Short Term Clinic Goals - 03/04/15 1249    CC Short Term Goal  #2   Status On-going             Long Term Clinic Goals - 02/25/15 1332    CC Long Term Goal  #1   Status On-going   CC Long Term Goal  #2   Status On-going            Plan - 03/04/15 1247    Clinical Impression Statement Measurements show slight ups and downs in right arm circumferences at different levels, but overall no great changes; fibrosis unchanged in upper arm.   Pt will benefit from skilled therapeutic intervention in order to improve on  the following deficits Increased edema;Decreased knowledge of precautions;Impaired UE functional use   Rehab Potential Good   PT Frequency 3x / week   PT Duration 6 weeks   PT Treatment/Interventions Manual lymph drainage;Compression bandaging   PT Next Visit Plan continue complete decongestive therapy   Recommended Other Services Patient would like to look into a lymphedema pump.  She is planning to apply for financial aid and [employer] assistance very soon.   Consulted and Agree with Plan of Care Patient        Problem List Patient Active Problem List   Diagnosis Date Noted  . Left arm pain 08/18/2014  . Breast cancer of upper-inner quadrant of right female breast 10/03/2013  . Post-lymphadenectomy lymphedema of arm 08/15/2013  . Port or reservoir infection 01/29/2013  . DVT (deep venous thrombosis) 10/07/2011  . Chest pain 09/10/2009  . Secondary cardiomyopathy 09/02/2009  . Essential hypertension 02/26/2009  . GERD  02/26/2009    Dary Dilauro 03/04/2015, 12:52 PM  [place], [place], [address] Phone: [phone]   Fax:  [place], PT 03/04/2015 12:52 PM

## 2015-03-04 NOTE — Progress Notes (Signed)
Pt wanted to apply for the [employer] grant to help pay for a sleeve.  Unfortunately, at this time pt cannot apply for the [place] because she's currently not under active treatment.  Per Val, she's under observation only.  I relayed this info to the pt.  She verbalized understanding.

## 2015-03-06 ENCOUNTER — Ambulatory Visit: Payer: Commercial Managed Care - PPO | Admitting: Physical Therapy

## 2015-03-06 DIAGNOSIS — I972 Postmastectomy lymphedema syndrome: Secondary | ICD-10-CM | POA: Diagnosis not present

## 2015-03-06 DIAGNOSIS — M25611 Stiffness of right shoulder, not elsewhere classified: Secondary | ICD-10-CM | POA: Diagnosis not present

## 2015-03-06 NOTE — Therapy (Signed)
[place], [place], [address] Phone: [phone]   Fax:  [phone]  Physical Therapy Treatment  Patient Details  Name: [name] MRN: [id number] Date of Birth: 12/10/1955 Referring Provider:  [name], MD  Encounter Date: 03/06/2015      PT End of Session - 03/06/15 1157    Visit Number 13   Number of Visits 19   Date for PT Re-Evaluation 03/22/15   PT Start Time 1110   PT Stop Time 1152   PT Time Calculation (min) 42 min   Activity Tolerance Patient tolerated treatment well   Behavior During Therapy [facility] for tasks assessed/performed      Past Medical History  Diagnosis Date  . DVT (deep venous thrombosis) 10/07/2011  . Breast cancer   . Hypertension   . Rheumatic fever     Past Surgical History  Procedure Laterality Date  . Mastectomy      bilateral  . Breast surgery    . Port a cath revision      There were no vitals filed for this visit.  Visit Diagnosis:  Postmastectomy lymphedema      Subjective Assessment - 03/06/15 1112    Subjective Bandages came off yesterday because they were drooping.  Put sleeve on, but arm swelling has gradually increased and got tight with the sleeve on.  Did better with the Medi dotted foam outside the ARtiflex.   Currently in Pain? Yes   Pain Score 2    Pain Location Arm   Pain Orientation Right   Pain Descriptors / Indicators Other (Comment);Aching  aches and pains that come and go   Pain Frequency Intermittent   Pain Relieving Factors motrin                         [facility] Adult PT Treatment/Exercise - 03/06/15 0001    Manual Therapy   Manual Lymphatic Drainage (MLD) In supine, short neck, superficial and deep abdomen, left axilla and anterior interaxillary anastomosis, right groin and axillo-inguinal anastomosis, and right UE from fingers to shoulder.   Compression Bandaging Lotion applied, stockinette, Elastomull  to all 5 fingers, peach Medi large dot foam around upper arm (especially posterior aspect), peach Medi small dot foam around posterior foream, and peach Medi lined foam at anterior forearm; Artiflex x 2, 1-6, 1-8 and 2-12 cm short stretch compression banadages from hand to axilla.                    Short Term Clinic Goals - 03/04/15 1249    CC Short Term Goal  #2   Status On-going             Long Term Clinic Goals - 02/25/15 1332    CC Long Term Goal  #1   Status On-going   CC Long Term Goal  #2   Status On-going            Plan - 03/06/15 1158    Clinical Impression Statement Arm appears more swollen today and bandages have been off for a while.     Pt will benefit from skilled therapeutic intervention in order to improve on the following deficits Increased edema;Decreased knowledge of precautions;Impaired UE functional use   PT Next Visit Plan continue complete decongestive therapy   Recommended Other Services will fax prescription to [facility], per patient's request; she will use insurance and not [employer] for coverage.   Consulted  and Agree with Plan of Care Patient        Problem List Patient Active Problem List   Diagnosis Date Noted  . Left arm pain 08/18/2014  . Breast cancer of upper-inner quadrant of right female breast 10/03/2013  . Post-lymphadenectomy lymphedema of arm 08/15/2013  . Port or reservoir infection 01/29/2013  . DVT (deep venous thrombosis) 10/07/2011  . Chest pain 09/10/2009  . Secondary cardiomyopathy 09/02/2009  . Essential hypertension 02/26/2009  . GERD 02/26/2009    [name] 03/06/2015, 11:59 AM  [place], [place], [address] Phone: [phone]   Fax:  [place], PT 03/06/2015 12:00 PM

## 2015-03-09 ENCOUNTER — Ambulatory Visit: Payer: Commercial Managed Care - PPO | Attending: Oncology

## 2015-03-09 DIAGNOSIS — I972 Postmastectomy lymphedema syndrome: Secondary | ICD-10-CM

## 2015-03-09 DIAGNOSIS — M25611 Stiffness of right shoulder, not elsewhere classified: Secondary | ICD-10-CM | POA: Insufficient documentation

## 2015-03-09 NOTE — Therapy (Signed)
[place], [place], [address] Phone: [phone]   Fax:  [phone]  Physical Therapy Treatment  Patient Details  Name: [name] MRN: [id number] Date of Birth: 04/20/1956 Referring Provider:  [name], MD  Encounter Date: 03/09/2015      PT End of Session - 03/09/15 1022    Visit Number 14   Number of Visits 19   Date for PT Re-Evaluation 03/22/15   PT Start Time 0934   PT Stop Time 1018   PT Time Calculation (min) 44 min      Past Medical History  Diagnosis Date  . DVT (deep venous thrombosis) 10/07/2011  . Breast cancer   . Hypertension   . Rheumatic fever     Past Surgical History  Procedure Laterality Date  . Mastectomy      bilateral  . Breast surgery    . Port a cath revision      There were no vitals filed for this visit.  Visit Diagnosis:  Postmastectomy lymphedema  Stiffness of joint, shoulder region, right      Subjective Assessment - 03/09/15 0936    Subjective Bandages keep sliding down, want to try doing 3 bandages today. Have my pump demo this week.    Currently in Pain? No/denies                         [facility] Adult PT Treatment/Exercise - 03/09/15 0001    Manual Therapy   Manual Lymphatic Drainage (MLD) In supine, short neck, superficial and deep abdomen, left axilla and anterior interaxillary anastomosis, right groin and axillo-inguinal anastomosis, and right UE from fingers to shoulder, then in Lt S/L for posterior inter-axillary anastomosis.   Compression Bandaging Lotion applied, TG soft med stockinette, Elastomull to all 5 fingers fixated with paper tape, peach Medi large dot foam around upper arm (especially posterior aspect), peach Medi small dot foam around posterior foream, and peach Medi lined foam at anterior forearm; Artiflex x 2, 1-6, 1-8 and 1-12 cm short stretch compression banadages from hand to axilla. (Pt wanted only 3  nadages today reporting arm is just too heavy and bulky to use with 4)                    Short Term Clinic Goals - 03/04/15 1249    CC Short Term Goal  #2   Status On-going             Long Term Clinic Goals - 02/25/15 1332    CC Long Term Goal  #1   Status On-going   CC Long Term Goal  #2   Status On-going            Plan - 03/09/15 1022    Clinical Impression Statement Pt wanted to try only 3 bandages today. Added thick stockinette to help counteract one less bandage, pt reported this very comfortable. She will pump demo this week.    Pt will benefit from skilled therapeutic intervention in order to improve on the following deficits Increased edema;Decreased knowledge of precautions;Impaired UE functional use   Rehab Potential Good   Clinical Impairments Affecting Rehab Potential Current infection in right arm, so treatment on hold.   PT Frequency 3x / week   PT Duration 6 weeks   PT Treatment/Interventions Manual lymph drainage;Compression bandaging   PT Next Visit Plan continue complete decongestive therapy, assess thick stockinette and only 3 bandages, remeasure  and assess goals.   Consulted and Agree with Plan of Care Patient        Problem List Patient Active Problem List   Diagnosis Date Noted  . Left arm pain 08/18/2014  . Breast cancer of upper-inner quadrant of right female breast 10/03/2013  . Post-lymphadenectomy lymphedema of arm 08/15/2013  . Port or reservoir infection 01/29/2013  . DVT (deep venous thrombosis) 10/07/2011  . Chest pain 09/10/2009  . Secondary cardiomyopathy 09/02/2009  . Essential hypertension 02/26/2009  . GERD 02/26/2009    [name], PTA 03/09/2015, 10:26 AM  [place], [place], [address] Phone: [phone]   Fax:  [phone]

## 2015-03-11 ENCOUNTER — Ambulatory Visit: Payer: Commercial Managed Care - PPO | Admitting: Physical Therapy

## 2015-03-11 DIAGNOSIS — I972 Postmastectomy lymphedema syndrome: Secondary | ICD-10-CM

## 2015-03-11 NOTE — Therapy (Signed)
[place], [place], [address] Phone: [phone]   Fax:  [phone]  Physical Therapy Treatment  Patient Details  Name: [name] MRN: [id number] Date of Birth: 1955-12-20 Referring Provider:  Chauncey Cruel, MD  Encounter Date: 03/11/2015      PT End of Session - 03/11/15 1448    Visit Number 15   Number of Visits 19   Date for PT Re-Evaluation 03/22/15   PT Start Time 0964   PT Stop Time 1107   PT Time Calculation (min) 44 min      Past Medical History  Diagnosis Date  . DVT (deep venous thrombosis) 10/07/2011  . Breast cancer   . Hypertension   . Rheumatic fever     Past Surgical History  Procedure Laterality Date  . Mastectomy      bilateral  . Breast surgery    . Port a cath revision      There were no vitals filed for this visit.  Visit Diagnosis:  Postmastectomy lymphedema      Subjective Assessment - 03/11/15 1024    Subjective Bandages came down--thinks the thick stockinette didn't work as well as the thin.  Took bandages off yesterday morning.   Currently in Pain? No/denies   Pain Location Arm   Pain Orientation Right   Pain Descriptors / Indicators --  just feels tight when she bends her arm            [facility] PT Assessment - 03/11/15 0001    Observation/Other Assessments   Skin Integrity some redness at right anterior elbow today; this was warm at first but cooled during manual lymph drainage treatment today           LYMPHEDEMA/ONCOLOGY QUESTIONNAIRE - 03/11/15 1028    Right Upper Extremity Lymphedema   15 cm Proximal to Olecranon Process 40 cm   10 cm Proximal to Olecranon Process 39.2 cm   Olecranon Process 40.3 cm   15 cm Proximal to Ulnar Styloid Process 37.5 cm   10 cm Proximal to Ulnar Styloid Process 29.8 cm   Just Proximal to Ulnar Styloid Process 23.6 cm   Across Hand at [employer] 21.2 cm   At [place] of 2nd Digit 7.4 cm                   [facility] Adult PT Treatment/Exercise - 03/11/15 0001    Manual Therapy   Manual Lymphatic Drainage (MLD) In supine, short neck, superficial and deep abdomen, left axilla and anterior interaxillary anastomosis, right groin and axillo-inguinal anastomosis, and right UE from fingers to shoulder, then in Lt S/L for posterior inter-axillary anastomosis.   Compression Bandaging Lotion applied, stockinette, Elastomull to all 5 fingers, peach Medi large dot foam around upper arm (especially posterior aspect), peach Medi small dot foam around posterior foream, and peach Medi lined foam at anterior forearm; Artiflex x 2, 1-6, 1-8 and 2-12 cm short stretch compression banadages from hand to axilla.                    Short Term Clinic Goals - 03/04/15 1249    CC Short Term Goal  #2   Status On-going             Long Term Clinic Goals - 02/25/15 1332    CC Long Term Goal  #1   Status On-going   CC Long Term Goal  #2   Status On-going  Plan - 03/11/15 1449    Clinical Impression Statement Patient's arm was increased in size today, with it not having been in bandages for the last 24+ hours.  Still needs regular fulltime bandaging to control swelling.  Is to be measured for new compression garments in the next couple of days.  Is also to have demo of pump today.     Pt will benefit from skilled therapeutic intervention in order to improve on the following deficits Increased edema;Decreased knowledge of precautions;Impaired UE functional use   Rehab Potential Good   PT Frequency 3x / week   PT Duration 6 weeks   PT Treatment/Interventions Manual lymph drainage;Compression bandaging   PT Next Visit Plan continue complete decongestive therapy   Consulted and Agree with Plan of Care Patient        Problem List Patient Active Problem List   Diagnosis Date Noted  . Left arm pain 08/18/2014  . Breast cancer of upper-inner quadrant of right female  breast 10/03/2013  . Post-lymphadenectomy lymphedema of arm 08/15/2013  . Port or reservoir infection 01/29/2013  . DVT (deep venous thrombosis) 10/07/2011  . Chest pain 09/10/2009  . Secondary cardiomyopathy 09/02/2009  . Essential hypertension 02/26/2009  . GERD 02/26/2009    SALISBURY,DONNA 03/11/2015, 2:56 PM  [place], [place], [address] Phone: [phone]   Fax:  [place], PT 03/11/2015 2:56 PM

## 2015-03-13 ENCOUNTER — Ambulatory Visit: Payer: Commercial Managed Care - PPO | Admitting: Physical Therapy

## 2015-03-13 DIAGNOSIS — M25611 Stiffness of right shoulder, not elsewhere classified: Secondary | ICD-10-CM

## 2015-03-13 DIAGNOSIS — I972 Postmastectomy lymphedema syndrome: Secondary | ICD-10-CM | POA: Diagnosis not present

## 2015-03-13 NOTE — Therapy (Signed)
[place], [place], [address] Phone: [phone]   Fax:  [phone]  Physical Therapy Treatment  Patient Details  Name: [name] MRN: [id number] Date of Birth: February 23, 1956 Referring Provider:  [name], MD  Encounter Date: 03/13/2015      PT End of Session - 03/13/15 1216    Visit Number 16   Number of Visits 19   Date for PT Re-Evaluation 03/22/15   PT Start Time 1103   PT Stop Time 1145   PT Time Calculation (min) 42 min      Past Medical History  Diagnosis Date  . DVT (deep venous thrombosis) 10/07/2011  . Breast cancer   . Hypertension   . Rheumatic fever     Past Surgical History  Procedure Laterality Date  . Mastectomy      bilateral  . Breast surgery    . Port a cath revision      There were no vitals filed for this visit.  Visit Diagnosis:  Postmastectomy lymphedema  Stiffness of joint, shoulder region, right      Subjective Assessment - 03/13/15 1111    Subjective pt states she took her bandages off yesterday early morning and came to get fitted for garments She stays her arm is just tight She says it looks like it has been looking   Pertinent History Had Doppler in early april  , which was negative for the right arm.  Just was prescribed an antibiotic because of redness in right arm.in early april   Currently in Pain? Yes   Pain Score 2    Pain Location Arm   Pain Orientation Right   Pain Descriptors / Indicators Aching                         [facility] Adult PT Treatment/Exercise - 03/13/15 0001    Shoulder Exercises: Supine   Other Supine Exercises scapular, shoulder, elbow wrist and hand range of motion with elevation    Other Supine Exercises diaphragmatic breathing    Manual Therapy   Manual Lymphatic Drainage (MLD) In supine, short neck, superficial and deep abdomen, left axilla and anterior interaxillary anastomosis, right groin and  axillo-inguinal anastomosis, and right UE from fingers to shoulder, then in Lt S/L for posterior inter-axillary anastomosis Extra time spend on deeper antifibrotic techniques  to chest, medial upper arm and forearm .   Compression Bandaging Lotion applied, thick stockinette, Elastomull to all 5 fingers, peach Medi large dot foam around upper arm (especially medial  aspect) with lined medi foam at lateral aspeact peach Medi small dot foam around posterior foream, Artiflex x 2, 1-6, 1-10 and 2-12 cm short stretch compression banadages from hand to axilla.                    Short Term Clinic Goals - 03/13/15 1221    CC Short Term Goal  #2   Title Rt. UE circumference at 15 cm. proximal to ulnar styloid will reduce by at least 2 cm.   Status On-going             Long Term Clinic Goals - 03/13/15 1221    CC Long Term Goal  #1   Title Rt. UE circumference at 15 cm. proximal to olecranon will reduce by at least 2 cm.   Status On-going   CC Long Term Goal  #2   Title Rt. UE circumference at 15  cm. proximal to ulnar styloid will reduce by at least 3 cm.   Status On-going   CC Long Term Goal  #3   Title Pt. will have been measured for new daytime compression garments.  meausred 03/12/2015   Status Achieved            Plan - 03/13/15 1216    Clinical Impression Statement pt continues with tightness and firmness in arm with decreased tissue extensitiblity especailly at medial upper arm. She she has rendness just above antecubiltal fossa that she says in unchanged. edema noted at anterior and posterior chest with decreased scar moiblity.  Reinforced educton about exercise and elevation today    PT Next Visit Plan continue complete decongestive therapy   PT Home Exercise Plan pt. was encouraged to do remedial ROM for lymphedema while in bandages        Problem List Patient Active Problem List   Diagnosis Date Noted  . Left arm pain 08/18/2014  . Breast cancer of  upper-inner quadrant of right female breast 10/03/2013  . Post-lymphadenectomy lymphedema of arm 08/15/2013  . Port or reservoir infection 01/29/2013  . DVT (deep venous thrombosis) 10/07/2011  . Chest pain 09/10/2009  . Secondary cardiomyopathy 09/02/2009  . Essential hypertension 02/26/2009  . GERD 02/26/2009   Donato Heinz. Owens Shark, PT  03/13/2015, 12:22 PM  [place], [place], [address] Phone: [phone]   Fax:  [phone]

## 2015-03-16 ENCOUNTER — Telehealth: Payer: Self-pay

## 2015-03-16 ENCOUNTER — Encounter: Payer: Self-pay | Admitting: Physical Therapy

## 2015-03-16 ENCOUNTER — Ambulatory Visit: Payer: Commercial Managed Care - PPO | Admitting: Physical Therapy

## 2015-03-16 ENCOUNTER — Telehealth: Payer: Self-pay | Admitting: Oncology

## 2015-03-16 ENCOUNTER — Ambulatory Visit (HOSPITAL_BASED_OUTPATIENT_CLINIC_OR_DEPARTMENT_OTHER): Payer: Commercial Managed Care - PPO

## 2015-03-16 ENCOUNTER — Other Ambulatory Visit (HOSPITAL_BASED_OUTPATIENT_CLINIC_OR_DEPARTMENT_OTHER): Payer: Commercial Managed Care - PPO

## 2015-03-16 ENCOUNTER — Ambulatory Visit (HOSPITAL_BASED_OUTPATIENT_CLINIC_OR_DEPARTMENT_OTHER): Payer: Commercial Managed Care - PPO | Admitting: Oncology

## 2015-03-16 VITALS — BP 136/87 | HR 80 | Temp 98.0°F | Resp 18 | Ht 66.0 in | Wt 173.7 lb

## 2015-03-16 DIAGNOSIS — R229 Localized swelling, mass and lump, unspecified: Secondary | ICD-10-CM | POA: Diagnosis not present

## 2015-03-16 DIAGNOSIS — C773 Secondary and unspecified malignant neoplasm of axilla and upper limb lymph nodes: Secondary | ICD-10-CM

## 2015-03-16 DIAGNOSIS — C770 Secondary and unspecified malignant neoplasm of lymph nodes of head, face and neck: Secondary | ICD-10-CM

## 2015-03-16 DIAGNOSIS — C50211 Malignant neoplasm of upper-inner quadrant of right female breast: Secondary | ICD-10-CM

## 2015-03-16 DIAGNOSIS — I972 Postmastectomy lymphedema syndrome: Secondary | ICD-10-CM | POA: Diagnosis not present

## 2015-03-16 DIAGNOSIS — Z86718 Personal history of other venous thrombosis and embolism: Secondary | ICD-10-CM

## 2015-03-16 DIAGNOSIS — M25611 Stiffness of right shoulder, not elsewhere classified: Secondary | ICD-10-CM

## 2015-03-16 DIAGNOSIS — Z95828 Presence of other vascular implants and grafts: Secondary | ICD-10-CM

## 2015-03-16 DIAGNOSIS — I890 Lymphedema, not elsewhere classified: Secondary | ICD-10-CM

## 2015-03-16 LAB — COMPREHENSIVE METABOLIC PANEL (CC13)
ALT: 25 U/L (ref 0–55)
AST: 41 U/L — ABNORMAL HIGH (ref 5–34)
Albumin: 3.6 g/dL (ref 3.5–5.0)
Alkaline Phosphatase: 117 U/L (ref 40–150)
Anion Gap: 11 mEq/L (ref 3–11)
BUN: 13.2 mg/dL (ref 7.0–26.0)
CO2: 27 mEq/L (ref 22–29)
Calcium: 9.7 mg/dL (ref 8.4–10.4)
Chloride: 102 mEq/L (ref 98–109)
Creatinine: 0.9 mg/dL (ref 0.6–1.1)
EGFR: 85 mL/min/{1.73_m2} — ABNORMAL LOW (ref >90)
Glucose: 112 mg/dl (ref 70–140)
Potassium: 3.6 mEq/L (ref 3.5–5.1)
Sodium: 140 mEq/L (ref 136–145)
Total Bilirubin: 0.22 mg/dL (ref 0.20–1.20)
Total Protein: 6.9 g/dL (ref 6.4–8.3)

## 2015-03-16 LAB — CBC WITH DIFFERENTIAL/PLATELET
BASO%: 0.3 % (ref 0.0–2.0)
Basophils Absolute: 0 10*3/uL (ref 0.0–0.1)
EOS%: 2 % (ref 0.0–7.0)
Eosinophils Absolute: 0.1 10*3/uL (ref 0.0–0.5)
HCT: 38 % (ref 34.8–46.6)
HGB: 12.6 g/dL (ref 11.6–15.9)
LYMPH%: 28.2 % (ref 14.0–49.7)
MCH: 28.8 pg (ref 25.1–34.0)
MCHC: 33.2 g/dL (ref 31.5–36.0)
MCV: 87 fL (ref 79.5–101.0)
MONO#: 0.4 10*3/uL (ref 0.1–0.9)
MONO%: 7.2 % (ref 0.0–14.0)
NEUT#: 3.8 10*3/uL (ref 1.5–6.5)
NEUT%: 62.3 % (ref 38.4–76.8)
Platelets: 169 10*3/uL (ref 145–400)
RBC: 4.37 10*6/uL (ref 3.70–5.45)
RDW: 13.6 % (ref 11.2–14.5)
WBC: 6.1 10*3/uL (ref 3.9–10.3)
lymph#: 1.7 10*3/uL (ref 0.9–3.3)

## 2015-03-16 MED ORDER — HEPARIN SOD (PORK) LOCK FLUSH 100 UNIT/ML IV SOLN
500.0000 [IU] | Freq: Once | INTRAVENOUS | Status: AC
  Administered 2015-03-16: 500 [IU] via INTRAVENOUS
  Filled 2015-03-16: qty 5

## 2015-03-16 MED ORDER — SODIUM CHLORIDE 0.9 % IJ SOLN
10.0000 mL | INTRAMUSCULAR | Status: DC | PRN
  Administered 2015-03-16: 10 mL via INTRAVENOUS
  Filled 2015-03-16: qty 10

## 2015-03-16 MED ORDER — DOXYCYCLINE HYCLATE 100 MG PO TABS: 100.0000 mg | ORAL_TABLET | Freq: Every day | ORAL | Status: DC

## 2015-03-16 NOTE — Patient Instructions (Signed)

## 2015-03-16 NOTE — Telephone Encounter (Signed)
Appointment made and avs printed for patient

## 2015-03-16 NOTE — Progress Notes (Signed)
[name]  [phone] [phone]  OFFICE PROGRESS NOTE   ID: [name]   DOB: [birth year]  MR#: [id number]  RSW#:[id number]   PCP: [name] Hey, [name] SU: [name]. [name], [name]/[name], [name] RAD KXF:GHWEXHB [name], [name] OTHER:  [name]  CHIEF COMPLAINT:  Stage IV Breast Cancer  CURRENT TREATMENT: Observation   BREAST CANCER HISTORY: From the original intake note:  The patient originally presented in May 2003 when she noticed a lump in the upper inner quadrant of her right breast in September 2003.  She sought attention and had a mammogram which showed an obvious carcinoma in the upper inner quadrant of the right breast, approximately 2 cm.  There were some enlarged lymph nodes in the axilla and an FNA done showed those consistent with malignant cells, most likely an invasive ductal carcinoma.  At that point she was unsure of what to do and was referred by [name] for a discussion of her treatment options.  By biopsy, it was ER/PR negative and HER-2 was 3+.  DNA index was 1.42.  We reiterated that it was most important to have her disease surgically addressed at that point and had recommended lumpectomy and axillary nodes to be addressed with which she followed through and on 04-03-02, Dr. Annamaria [name] performed a right partial mastectomy and a right axillary lymph node [name].  Final pathology revealed a 2.4 cm. high grade, Grade III invasive ductal carcinoma with an adjacent .8 cm. also high grade invasive ductal carcinoma which was felt to represent an intramammary metastasis rather than a second primary.  A smaller mass was just medial to the larger mass.  The smaller mass was associated with high grade DCIS component.  There was no definite lymphovascular invasion identified.  However, one of fourteen axillary lymph nodes did contain a 1.5 cm. metastatic deposit.  Postoperatively she did very well.  We reiterated the fact that she  did need adjuvant chemotherapy, however, she refused and decided that she would pursue radiation.  She received radiation and completed that on 08-14-02. Her subsequent history is as detailed below.   INTERVAL HISTORY: [name] returns today for followup of her breast cancer. She was seen here in March 2016 and then again early April 2016 with increasing right upper extremity edema. Right upper extremity Dopplers were obtained 01/20/2015 and 02/09/2015, both negative. She was treated with Keflex for the cellulitis and has been receiving intense physical therapy, with very little improvement.  REVIEW OF SYSTEMS: Aside from the problem with the right upper extremity, she lost quite a bit of weight, but is now gaining it back. She tells me she was juicing. She denies unusual headaches, visual changes, nausea, vomiting, dizziness, or gait imbalance. There have been no cough, phlegm production, or pleurisy. She denies any change in bowel or bladder habits. A detailed review of systems was otherwise noncontributory  PAST MEDICAL HISTORY: Past Medical History  Diagnosis Date  . DVT (deep venous thrombosis) 10/07/2011  . Breast cancer   . Hypertension   . Rheumatic fever     PAST SURGICAL HISTORY: Past Surgical History  Procedure Laterality Date  . Mastectomy      bilateral  . Breast surgery    . Port a cath revision      FAMILY HISTORY Family History  Problem Relation Age of Onset  . Pancreatic cancer Mother   . Kidney cancer Sister 2  . Breast cancer Sister 53  . Breast cancer Other 33  The patient's father died in his 48J  from complications of alcohol abuse. The patient's mother died in her 49s from pancreatic cancer. The patient has 6 brothers, 2 sisters. Both her sisters have had breast cancer, both diagnosed after the age of 66. The patient has not had genetic testing so far.   GYNECOLOGIC HISTORY: Menarche age 20; first live birth age 79. She is GXP4. She is not sure when she  stopped having periods. She never used hormone replacement.   SOCIAL HISTORY: The patient has worked in the past as a Psychologist, counselling. At home in addition to the patient are her husband [name], originally from [place], who works as a Administrator.  Also living in the home is her son [name] and her daughter [name] (she is studying to be a Marine scientist).  The other 2 children are [name], who has a college degree in psychology and works as a Probation officer; and [name], who lives in [place] and works as a Higher education careers adviser.   ADVANCED DIRECTIVES: Not in place  HEALTH MAINTENANCE: History  Substance Use Topics  . Smoking status: Light Tobacco Smoker  . Smokeless tobacco: Never Used  . Alcohol Use: Yes     Comment: Rarely     Colonoscopy:  Not on file  PAP:  Not on file  Bone density:  Not on file  Lipid panel:  Not on file    Allergies  Allergen Reactions  . Tramadol Nausea Only  . Hydrocodone-Acetaminophen Itching and Rash    Current Outpatient Prescriptions  Medication Sig Dispense Refill  . alprazolam (XANAX) 2 MG tablet Take 2 mg by mouth daily.     . Ascorbic Acid (VITAMIN C) 100 MG tablet Take 100 mg by mouth daily.      . calcium-vitamin D (OSCAL WITH D) 500-200 MG-UNIT per tablet Take 1 tablet by mouth daily.      . cephALEXin (KEFLEX) 500 MG capsule Take 1 capsule (500 mg total) by mouth 4 (four) times daily. 40 capsule 0  . gabapentin (NEURONTIN) 100 MG capsule Take 1 capsule (100 mg total) by mouth at bedtime. 30 capsule 2  . Multiple Vitamin (MULTIVITAMIN) capsule Take 1 capsule by mouth daily.      . Oxycodone HCl 10 MG TABS 1 tab PO Q 6 hours PRN pain.  DO NOT TAKE ANY OTHER PAIN MEDS WHEN TAKING. 25 tablet 0  . oxymorphone (OPANA) 10 MG tablet Take 1 tablet by mouth 3 (three) times daily as needed.  0  . triamterene-hydrochlorothiazide (MAXZIDE-25) 37.5-25 MG per tablet TAKE 1 EACH (1 CAPSULE TOTAL) BY MOUTH EVERY MORNING. 30 tablet 2   No current facility-administered  medications for this visit.   Facility-Administered Medications Ordered in Other Visits  Medication Dose Route Frequency Provider Last Rate Last Dose  . sodium chloride 0.9 % injection 10 mL  10 mL Intravenous PRN Chauncey Cruel, [name]   10 mL at 03/04/14 1421    OBJECTIVE: Middle-aged African American woman  Filed Vitals:   03/16/15 1456  BP: 136/87  Pulse: 80  Temp: 98 F (36.7 C)  Resp: 18     Body mass index is 28.05 kg/(m^2).    ECOG FS: 1 Filed Weights   03/16/15 1456  Weight: 173 lb 11.2 oz (78.79 kg)   Sclerae unicteric, EOMs intact  Oropharynx clear, no thrush or other lesions  No cervical or supraclavicular adenopathy Lungs no rales or rhonchi Heart regular rate and rhythm Abd soft, nontender, positive bowel sounds MSK no  focal spinal tenderness,grade 3 right upper extremity lymphedema with mild erythema  Neuro: nonfocal, well oriented, appropriate affect Breasts: status post bilateral mastectomies. There is no evidence of chest wall recurrence.The right axilla is benign. In the left axilla there is a 1 cm movable lymph node.    LAB RESULTS:. Results for DANNICA, BICKHAM (MRN [id number]) as of 03/16/2015 15:04  Ref. Range 05/17/2012 09:49 10/05/2012 14:28 05/29/2014 13:17 08/21/2014 13:43 12/08/2014 12:29  CA 27.29 Latest Ref Range: 0-39 U/mL 54 (H) 37 _0 Lab Results  Component Value Date   WBC 6.1 03/16/2015   NEUTROABS 3.8 03/16/2015   HGB 12.6 03/16/2015   HCT 38.0 03/16/2015   MCV 87.0 03/16/2015   PLT 169 03/16/2015      Chemistry      Component Value Date/Time   NA 140 03/02/2015 1354   NA 140 08/14/2014 0100   K 3.7 03/02/2015 1354   K 3.8 08/14/2014 0100   CL 100 08/14/2014 0100   CL 101 03/07/2013 1411   CO2 26 03/02/2015 1354   CO2 28 08/14/2014 0100   BUN 17.0 03/02/2015 1354   BUN 16 08/14/2014 0100   CREATININE 1.0 03/02/2015 1354   CREATININE 0.78 08/14/2014 0100      Component Value Date/Time   CALCIUM 9.8 03/02/2015 1354   CALCIUM  9.5 08/14/2014 0100   ALKPHOS 111 03/02/2015 1354   ALKPHOS 159* 08/14/2014 0100   AST 29 03/02/2015 1354   AST 47* 08/14/2014 0100   ALT 17 03/02/2015 1354   ALT 35 08/14/2014 0100   BILITOT 0.22 03/02/2015 1354   BILITOT <0.2* 08/14/2014 0100       STUDIES: No results found.  ASSESSMENT:  59 y.o. [place] woman with stage IV breast cancer manifested chiefly by loco-regional nodal disease (neck, chest), without lung, liver, bone, or brain involvement.  (1) Status post right lumpectomy and sentinel lymph node sampling 03/04/2002 for 2 separate foci of invasive ductal carcinoma, mpT2 pN1 or stage IIB, both foci grade 3, both estrogen and progesterone receptor positive, both HercepTest 3+, Mib-1 56%  (2) Reexcision for margins 05/27/2002 showed no residual cancer in the breast.  (3) The patient refused adjuvant systemic therapy.  (4) Adjuvant radiation treatment completed 08/14/2002.  (5) recurrence in the right breast in 02/2004 showing a morphologically different tumor, again grade 3, again estrogen and progesterone receptor negative, with an MIB-1 of 14% and Herceptest 3+.  (5) Between 03/2004 and 07/2004 she received dose dense Doxorubicin/Cyclophosphamide x 4 given with trastuzumab, followed by weekly Docetaxel x 8, again given with trastuzumab.  (6) Right mastectomy 07/13/2004 showed scattered microscopic foci of residual disease over an area  greater than 5 cm. Margins were negative.  (7) Postoperative Docetaxel continued until 09/2004.  (8) Trastuzumab (Herceptin) given 08/2004 through 01/2012 with some brief interruptions.  (9) Isolated right cervical nodal recurrence 10/2005, treated with radiation to the right supraclavicular area (total 41.5 gray) completed 12/29/2005.  (10) Navelbine given together with Herceptin  11/2005 through 03/2006.  (9) Left mastectomy 02/13/2006 for ductal carcinoma in situ, grade 2, estrogen and progesterone receptor negative, with  negative margins; 0 of 3 lymph nodes involved  (10) treated with Lapatinib and Capecitabine before 10/2009, for an unclear duration and with unclear results (cannot locate data on chart review).  (11) Status post right supraclavicular lymph node biopsy 09/2010 again positive for an invasive ductal carcinoma, estrogen and progesterone receptor negative, HER-2 positive by CISH with a ratio 4.25.  (12)  Navelbine given together with Herceptin between 05/2011 and 11/2011.  (13) Carboplatin/ Gemcitabine/ Herceptin given for 2 cycles, in 12/2011 and 01/2012.  (14) TDM-1 (Kadcyla) started 02/2012. Last dose 10/02/2013 after which the patient discontinued treatment at her own discretion. Most recent echo on December 2014 showed a well preserved ejection fraction.  (15) Deep vein thrombosis of the right upper extremity documented 04/20/2011.  She completed anticoagulant therapy with Coumadin on 03/25/2013.  (16) Chronic right upper extremity lymphedema.  (17) Right chest port-a-cath removal due to infection on 01/28/2013. Left chest Port-A-Cath placed on 04/08/2013; be flushed every 6 weeks   PLAN: It could be all Kurt has is progressive scarring from her prior treatments. On the other hand she could have had further evidence of recurrence particularly involving the drainage area of the right upper extremity. We are going to obtain a CT scan this week to evaluate that.  She also has a movable mass in the in the left axilla. This could be a lymph node or it could be scar tissue but I do not recall palpating it previously. It may require further evaluation.  Zenda is reluctant to undergo further tests but she does agree to the CT scan. I have made her a return appointment with me for later this week.  She will call with any problems that may develop before the next visit.  Chauncey Cruel, [name]  03/16/2015 2:59 PM

## 2015-03-16 NOTE — Telephone Encounter (Signed)
This RN returned call to pt and informed her per MD recommendation the doxycycline is most appropriate due to need to stay on for not only current problem but for maintenance of cellulitis..  Per discussion Angelisse verbalized understanding.

## 2015-03-16 NOTE — Telephone Encounter (Signed)
Pt lvm stating she got a prescription today for an antibiotic. She stated many antibiotics make her itch. She can take penicillin and wants the rx for penicillin.

## 2015-03-16 NOTE — Therapy (Signed)
[place], [place], [address] Phone: [phone]   Fax:  [phone]  Physical Therapy Treatment  Patient Details  Name: [name] MRN: [id number] Date of Birth: 1956/02/09 Referring Provider:  [name], MD  Encounter Date: 03/16/2015      PT End of Session - 03/16/15 1341    Visit Number 17   Number of Visits 19   Date for PT Re-Evaluation 03/22/15   PT Start Time 1301   PT Stop Time 1342   PT Time Calculation (min) 41 min   Activity Tolerance Patient tolerated treatment well   Behavior During Therapy [facility] for tasks assessed/performed      Past Medical History  Diagnosis Date  . DVT (deep venous thrombosis) 10/07/2011  . Breast cancer   . Hypertension   . Rheumatic fever     Past Surgical History  Procedure Laterality Date  . Mastectomy      bilateral  . Breast surgery    . Port a cath revision      There were no vitals filed for this visit.  Visit Diagnosis:  Postmastectomy lymphedema  Stiffness of joint, shoulder region, right      Subjective Assessment - 03/16/15 1305    Subjective Feeling a little down.  I have alot of things to think and pray about.  My arm is bothering me at my wrist but it's not pain.  I see Dr. Jana Hakim this afternoon.   Patient Stated Goals Reduce arm size   Currently in Pain? No/denies                         [facility] Adult PT Treatment/Exercise - 03/16/15 0001    Manual Therapy   Manual Lymphatic Drainage (MLD) In supine, short neck, superficial and deep abdomen, left axilla and anterior interaxillary anastomosis, right groin and axillo-inguinal anastomosis, and right UE from fingers to shoulder, Extra time spent on deeper antifibrotic techniques  to chest, medial upper arm and forearm .   Compression Bandaging Did not bandage today as pt has an appointment with her oncologist after her visit here and wants him to look at her  arm.                   Short Term Clinic Goals - 03/13/15 1221    CC Short Term Goal  #2   Title Rt. UE circumference at 15 cm. proximal to ulnar styloid will reduce by at least 2 cm.   Status On-going             Long Term Clinic Goals - 03/13/15 1221    CC Long Term Goal  #1   Title Rt. UE circumference at 15 cm. proximal to olecranon will reduce by at least 2 cm.   Status On-going   CC Long Term Goal  #2   Title Rt. UE circumference at 15 cm. proximal to ulnar styloid will reduce by at least 3 cm.   Status On-going   CC Long Term Goal  #3   Title Pt. will have been measured for new daytime compression garments.  meausred 03/12/2015   Status Achieved            Plan - 03/16/15 1342    Clinical Impression Statement I have concerns about the amount of fibrosis in her arm, the size of her arm and the poor results we have had with compression bandaging and manual lymph drainage.  I did not bandage her today so the MD can examine her arm this afternoon at her appointment. She continues to have severe swelling limiting her arm function.   Pt will benefit from skilled therapeutic intervention in order to improve on the following deficits Increased edema;Decreased knowledge of precautions;Impaired UE functional use   Rehab Potential Good   PT Treatment/Interventions Manual lymph drainage;Compression bandaging   PT Next Visit Plan continue complete decongestive therapy - or continue per MD recommendations as he is seeing her today.   Consulted and Agree with Plan of Care Patient        Problem List Patient Active Problem List   Diagnosis Date Noted  . Left arm pain 08/18/2014  . Breast cancer of upper-inner quadrant of right female breast 10/03/2013  . Post-lymphadenectomy lymphedema of arm 08/15/2013  . Port or reservoir infection 01/29/2013  . DVT (deep venous thrombosis) 10/07/2011  . Chest pain 09/10/2009  . Secondary cardiomyopathy 09/02/2009  .  Essential hypertension 02/26/2009  . GERD 02/26/2009    [name], PT 03/16/2015, 3:03 PM  [place], [place], [address] Phone: [phone]   Fax:  [phone]

## 2015-03-18 ENCOUNTER — Other Ambulatory Visit: Payer: Self-pay | Admitting: *Deleted

## 2015-03-18 ENCOUNTER — Ambulatory Visit: Payer: Commercial Managed Care - PPO

## 2015-03-18 DIAGNOSIS — I972 Postmastectomy lymphedema syndrome: Secondary | ICD-10-CM

## 2015-03-18 DIAGNOSIS — M25611 Stiffness of right shoulder, not elsewhere classified: Secondary | ICD-10-CM

## 2015-03-18 NOTE — Therapy (Signed)
[place], [place], [address] Phone: [phone]   Fax:  [phone]  Physical Therapy Treatment  Patient Details  Name: [name] MRN: [id number] Date of Birth: 01-Feb-1956 Referring Provider:  Chauncey Cruel, MD  Encounter Date: 03/18/2015      PT End of Session - 03/18/15 1104    Visit Number 18   Number of Visits 19   Date for PT Re-Evaluation 03/22/15   PT Start Time 1016   PT Stop Time 1101   PT Time Calculation (min) 45 min      Past Medical History  Diagnosis Date  . DVT (deep venous thrombosis) 10/07/2011  . Breast cancer   . Hypertension   . Rheumatic fever     Past Surgical History  Procedure Laterality Date  . Mastectomy      bilateral  . Breast surgery    . Port a cath revision      There were no vitals filed for this visit.  Visit Diagnosis:  Postmastectomy lymphedema  Stiffness of joint, shoulder region, right      Subjective Assessment - 03/18/15 1022    Subjective Still feeling down, my arm is really bothering me because it wont go down, scared I have more cancer. I don't want more chemo. Supposed to have a CAT scan tomorrow.   Currently in Pain? No/denies               LYMPHEDEMA/ONCOLOGY QUESTIONNAIRE - 03/18/15 1023    Right Upper Extremity Lymphedema   15 cm Proximal to Olecranon Process 41.4 cm   10 cm Proximal to Olecranon Process 40.4 cm   Olecranon Process 41.7 cm   15 cm Proximal to Ulnar Styloid Process 37.5 cm   10 cm Proximal to Ulnar Styloid Process 29.9 cm   Just Proximal to Ulnar Styloid Process 24.5 cm   Across Hand at [employer] 24 cm   At [place] of 2nd Digit 8.1 cm                  [facility] Adult PT Treatment/Exercise - 03/18/15 0001    Manual Therapy   Manual Lymphatic Drainage (MLD) In supine, short neck, superficial and deep abdomen, left axilla and anterior interaxillary anastomosis, right groin and  axillo-inguinal anastomosis, and right UE from fingers to shoulder, Extra time spent on deeper antifibrotic techniques  to chest, medial upper arm and forearm .   Compression Bandaging Biotone lotion applied, thick stockinette, Elastomull to all 5 fingers, Artifelx x2 with Peach Medi foam small dots at posterior forearm, lined foam at anterior forearm, and large dots at medial upper arm, then 4 short stretch compression bandages from hand to axilla.                    Short Term Clinic Goals - 03/13/15 1221    CC Short Term Goal  #2   Title Rt. UE circumference at 15 cm. proximal to ulnar styloid will reduce by at least 2 cm.   Status On-going             Long Term Clinic Goals - 03/13/15 1221    CC Long Term Goal  #1   Title Rt. UE circumference at 15 cm. proximal to olecranon will reduce by at least 2 cm.   Status On-going   CC Long Term Goal  #2   Title Rt. UE circumference at 15 cm. proximal to ulnar styloid will reduce  by at least 3 cm.   Status On-going   CC Long Term Goal  #3   Title Pt. will have been measured for new daytime compression garments.  meausred 03/12/2015   Status Achieved            Plan - 03/18/15 1104    Clinical Impression Statement Pts circumference measurements continue to increase at most areas of Rt UE, though she wasnt bandaged at last appt so Dr could assess pts arm. She is to have a scan tomorrow, though reports she "doesnt want to know". Strongly encouraged pt to have it done so she can at least know what she is up against, and she seemed to agree and said she would call to see when her appt will be.    Pt will benefit from skilled therapeutic intervention in order to improve on the following deficits Increased edema;Decreased knowledge of precautions;Impaired UE functional use   Rehab Potential Good   Clinical Impairments Affecting Rehab Potential Current infection in right arm, so treatment on hold.   PT Frequency 3x / week   PT  Duration 6 weeks   PT Treatment/Interventions Manual lymph drainage;Compression bandaging   PT Next Visit Plan continue complete decongestive therapy; assess results of scan pt said she is to have done.   Consulted and Agree with Plan of Care Patient        Problem List Patient Active Problem List   Diagnosis Date Noted  . Left arm pain 08/18/2014  . Breast cancer of upper-inner quadrant of right female breast 10/03/2013  . Post-lymphadenectomy lymphedema of arm 08/15/2013  . Port or reservoir infection 01/29/2013  . DVT (deep venous thrombosis) 10/07/2011  . Chest pain 09/10/2009  . Secondary cardiomyopathy 09/02/2009  . Essential hypertension 02/26/2009  . GERD 02/26/2009    [name], PTA 03/18/2015, 11:07 AM  [place], [place], [address] Phone: [phone]   Fax:  [phone]

## 2015-03-19 ENCOUNTER — Encounter (HOSPITAL_COMMUNITY): Payer: Self-pay

## 2015-03-19 ENCOUNTER — Ambulatory Visit (HOSPITAL_COMMUNITY)
Admission: RE | Admit: 2015-03-19 | Discharge: 2015-03-19 | Disposition: A | Discharge status: Home / Self Care | Payer: Commercial Managed Care - PPO | Source: Ambulatory Visit | Attending: Oncology | Admitting: Oncology

## 2015-03-19 ENCOUNTER — Ambulatory Visit: Payer: Commercial Managed Care - PPO | Admitting: Physical Therapy

## 2015-03-19 DIAGNOSIS — I890 Lymphedema, not elsewhere classified: Secondary | ICD-10-CM | POA: Diagnosis not present

## 2015-03-19 DIAGNOSIS — C50919 Malignant neoplasm of unspecified site of unspecified female breast: Secondary | ICD-10-CM | POA: Diagnosis not present

## 2015-03-19 DIAGNOSIS — Z08 Encounter for follow-up examination after completed treatment for malignant neoplasm: Secondary | ICD-10-CM | POA: Diagnosis not present

## 2015-03-19 DIAGNOSIS — I2510 Atherosclerotic heart disease of native coronary artery without angina pectoris: Secondary | ICD-10-CM | POA: Diagnosis not present

## 2015-03-19 DIAGNOSIS — Z853 Personal history of malignant neoplasm of breast: Secondary | ICD-10-CM | POA: Diagnosis not present

## 2015-03-19 DIAGNOSIS — R911 Solitary pulmonary nodule: Secondary | ICD-10-CM | POA: Diagnosis not present

## 2015-03-19 DIAGNOSIS — R591 Generalized enlarged lymph nodes: Secondary | ICD-10-CM | POA: Diagnosis not present

## 2015-03-19 DIAGNOSIS — C50211 Malignant neoplasm of upper-inner quadrant of right female breast: Secondary | ICD-10-CM

## 2015-03-19 MED ORDER — DIPHENHYDRAMINE HCL 25 MG PO CAPS
50.0000 mg | ORAL_CAPSULE | Freq: Once | ORAL | Status: AC
  Administered 2015-03-19: 50 mg via ORAL

## 2015-03-19 MED ORDER — IOHEXOL 300 MG/ML  SOLN
100.0000 mL | Freq: Once | INTRAMUSCULAR | Status: AC | PRN
  Administered 2015-03-19: 100 mL via INTRAVENOUS

## 2015-03-20 ENCOUNTER — Ambulatory Visit (HOSPITAL_BASED_OUTPATIENT_CLINIC_OR_DEPARTMENT_OTHER): Payer: Commercial Managed Care - PPO | Admitting: Oncology

## 2015-03-20 ENCOUNTER — Telehealth: Payer: Self-pay | Admitting: Oncology

## 2015-03-20 VITALS — BP 139/80 | HR 91 | Temp 99.1°F | Resp 18 | Ht 66.0 in | Wt 173.4 lb

## 2015-03-20 DIAGNOSIS — R229 Localized swelling, mass and lump, unspecified: Secondary | ICD-10-CM | POA: Diagnosis not present

## 2015-03-20 DIAGNOSIS — C50211 Malignant neoplasm of upper-inner quadrant of right female breast: Secondary | ICD-10-CM

## 2015-03-20 DIAGNOSIS — E8989 Other postprocedural endocrine and metabolic complications and disorders: Secondary | ICD-10-CM

## 2015-03-20 DIAGNOSIS — C770 Secondary and unspecified malignant neoplasm of lymph nodes of head, face and neck: Secondary | ICD-10-CM | POA: Diagnosis not present

## 2015-03-20 DIAGNOSIS — C773 Secondary and unspecified malignant neoplasm of axilla and upper limb lymph nodes: Secondary | ICD-10-CM

## 2015-03-20 DIAGNOSIS — Z86718 Personal history of other venous thrombosis and embolism: Secondary | ICD-10-CM

## 2015-03-20 DIAGNOSIS — I890 Lymphedema, not elsewhere classified: Secondary | ICD-10-CM

## 2015-03-20 MED ORDER — GABAPENTIN 300 MG PO CAPS: 300.0000 mg | ORAL_CAPSULE | Freq: Every day | ORAL | Status: DC

## 2015-03-20 NOTE — Progress Notes (Signed)
[place]  [phone] [phone]  OFFICE PROGRESS NOTE   ID: [name]   DOB: 01-Aug-1956  MR#: [id number]  MWN#:[id number]   PCP: [name] Hey, MD SU: [name]. [name], [name], [name] RAD QIH:[id number] [name], [name] OTHER:  [name] Bickers, MD  CHIEF COMPLAINT:  Stage IV Breast Cancer  CURRENT TREATMENT: Observation   BREAST CANCER HISTORY: From the original intake note:  The patient originally presented in May 2003 when she noticed a lump in the upper inner quadrant of her right breast in September 2003.  She sought attention and had a mammogram which showed an obvious carcinoma in the upper inner quadrant of the right breast, approximately 2 cm.  There were some enlarged lymph nodes in the axilla and an FNA done showed those consistent with malignant cells, most likely an invasive ductal carcinoma.  At that point she was unsure of what to do and was referred by Dr. Marylene [name] for a discussion of her treatment options.  By biopsy, it was ER/PR negative and HER-2 was 3+.  DNA index was 1.42.  We reiterated that it was most important to have her disease surgically addressed at that point and had recommended lumpectomy and axillary nodes to be addressed with which she followed through and on 04-03-02, Dr. Annamaria [name] performed a right partial mastectomy and a right axillary lymph node [name].  Final pathology revealed a 2.4 cm. high grade, Grade III invasive ductal carcinoma with an adjacent .8 cm. also high grade invasive ductal carcinoma which was felt to represent an intramammary metastasis rather than a second primary.  A smaller mass was just medial to the larger mass.  The smaller mass was associated with high grade DCIS component.  There was no definite lymphovascular invasion identified.  However, one of fourteen axillary lymph nodes did contain a 1.5 cm. metastatic deposit.  Postoperatively she did very well.  We reiterated the fact that she  did need adjuvant chemotherapy, however, she refused and decided that she would pursue radiation.  She received radiation and completed that on 08-14-02. Her subsequent history is as detailed below.   INTERVAL HISTORY: [name] returns today for followup of her breast cancer accompanied by a friend. Since the last visit here she had a chest CT which fortunately shows no evidence of recurrent or progressive disease. He does not give [name] a specific reason for the intractable nature of the right upper extremity lymphedema, which is itself of course due to the prior surgery to that area  REVIEW OF SYSTEMS: A detailed review of systems today was otherwise noncontributory. The patient continues to take "juice" in an attempt to lose weight. She is not exercising regularly.  PAST MEDICAL HISTORY: Past Medical History  Diagnosis Date  . DVT (deep venous thrombosis) 10/07/2011  . Breast cancer   . Hypertension   . Rheumatic fever     PAST SURGICAL HISTORY: Past Surgical History  Procedure Laterality Date  . Mastectomy      bilateral  . Breast surgery    . Port a cath revision      FAMILY HISTORY Family History  Problem Relation Age of Onset  . Pancreatic cancer Mother   . Kidney cancer Sister 23  . Breast cancer Sister 45  . Breast cancer Other 29  The patient's father died in his 63O  from complications of alcohol abuse. The patient's mother died in her 87s from pancreatic cancer. The patient has 6 brothers, 2 sisters. Both her sisters  have had breast cancer, both diagnosed after the age of 9. The patient has not had genetic testing so far.   GYNECOLOGIC HISTORY: Menarche age 9; first live birth age 42. She is GXP4. She is not sure when she stopped having periods. She never used hormone replacement.   SOCIAL HISTORY: The patient has worked in the past as a Psychologist, counselling. At home in addition to the patient are her husband [name], originally from [place], who works as a Dietitian.  Also living in the home is her son [name] and her daughter [name] (she is studying to be a Marine scientist).  The other 2 children are [name], who has a college degree in psychology and works as a Probation officer; and [name], who lives in [place] and works as a Higher education careers adviser.   ADVANCED DIRECTIVES: Not in place  HEALTH MAINTENANCE: History  Substance Use Topics  . Smoking status: Light Tobacco Smoker  . Smokeless tobacco: Never Used  . Alcohol Use: Yes     Comment: Rarely     Colonoscopy:  Not on file  PAP:  Not on file  Bone density:  Not on file  Lipid panel:  Not on file    Allergies  Allergen Reactions  . Tramadol Nausea Only  . Hydrocodone-Acetaminophen Itching and Rash    Current Outpatient Prescriptions  Medication Sig Dispense Refill  . alprazolam (XANAX) 2 MG tablet Take 2 mg by mouth daily.     . Ascorbic Acid (VITAMIN C) 100 MG tablet Take 100 mg by mouth daily.      . calcium-vitamin D (OSCAL WITH D) 500-200 MG-UNIT per tablet Take 1 tablet by mouth daily.      Marland Kitchen doxycycline (VIBRA-TABS) 100 MG tablet Take 1 tablet (100 mg total) by mouth daily. 30 tablet 4  . gabapentin (NEURONTIN) 100 MG capsule Take 1 capsule (100 mg total) by mouth at bedtime. 30 capsule 2  . Multiple Vitamin (MULTIVITAMIN) capsule Take 1 capsule by mouth daily.      . Oxycodone HCl 10 MG TABS 1 tab PO Q 6 hours PRN pain.  DO NOT TAKE ANY OTHER PAIN MEDS WHEN TAKING. 25 tablet 0  . oxymorphone (OPANA) 10 MG tablet Take 1 tablet by mouth 3 (three) times daily as needed.  0  . triamterene-hydrochlorothiazide (MAXZIDE-25) 37.5-25 MG per tablet TAKE 1 EACH (1 CAPSULE TOTAL) BY MOUTH EVERY MORNING. 30 tablet 2   No current facility-administered medications for this visit.   Facility-Administered Medications Ordered in Other Visits  Medication Dose Route Frequency Provider Last Rate Last Dose  . sodium chloride 0.9 % injection 10 mL  10 mL Intravenous PRN [name] Cruel, MD   10 mL at 03/04/14 1421     OBJECTIVE: Middle-aged [place] American woman who refused physical exam today and specifically refused left axillary palpation  Filed Vitals:   03/20/15 1442  BP: 139/80  Pulse: 91  Temp: 99.1 F (37.3 C)  Resp: 18     Body mass index is 28 kg/(m^2).    ECOG FS: 1 Filed Weights   03/20/15 1442  Weight: 173 lb 6.4 oz (78.654 kg)    LAB RESULTS:. Results for KERRA, GUILFOIL (MRN [id number]) as of 03/16/2015 15:04  Ref. Range 05/17/2012 09:49 10/05/2012 14:28 05/29/2014 13:17 08/21/2014 13:43 12/08/2014 12:29  CA 27.29 Latest Ref Range: 0-39 U/mL 54 (H) 37 '29 28 30   ' Lab Results  Component Value Date   WBC 6.1 03/16/2015   NEUTROABS 3.8 03/16/2015   HGB  12.6 03/16/2015   HCT 38.0 03/16/2015   MCV 87.0 03/16/2015   PLT 169 03/16/2015      Chemistry      Component Value Date/Time   NA 140 03/16/2015 1439   NA 140 08/14/2014 0100   K 3.6 03/16/2015 1439   K 3.8 08/14/2014 0100   CL 100 08/14/2014 0100   CL 101 03/07/2013 1411   CO2 27 03/16/2015 1439   CO2 28 08/14/2014 0100   BUN 13.2 03/16/2015 1439   BUN 16 08/14/2014 0100   CREATININE 0.9 03/16/2015 1439   CREATININE 0.78 08/14/2014 0100      Component Value Date/Time   CALCIUM 9.7 03/16/2015 1439   CALCIUM 9.5 08/14/2014 0100   ALKPHOS 117 03/16/2015 1439   ALKPHOS 159* 08/14/2014 0100   AST 41* 03/16/2015 1439   AST 47* 08/14/2014 0100   ALT 25 03/16/2015 1439   ALT 35 08/14/2014 0100   BILITOT 0.22 03/16/2015 1439   BILITOT <0.2* 08/14/2014 0100       STUDIES: Ct Chest W Contrast  03/19/2015   CLINICAL DATA:  Followup breast cancer. DVT and lymph edema and right arm.  EXAM: CT CHEST WITH CONTRAST  TECHNIQUE: Multidetector CT imaging of the chest was performed during intravenous contrast administration.  CONTRAST:  171m OMNIPAQUE IOHEXOL 300 MG/ML  SOLN  COMPARISON:  02/25/2014  FINDINGS: Mediastinum: Normal heart size. No pericardial effusion identified. Aortic atherosclerosis noted. There also  calcifications within the LAD Coronary artery. The trachea appears patent and is midline. Normal appearance of the esophagus. There is no mediastinal or hilar adenopathy identified. Status post bilateral mastectomy. Skin thickening and subcutaneous edema is identified within the soft tissues of the right upper chest and right upper extremity.  Lungs/Pleura: No pleural effusion. There is subpleural fibrosis within the anterior right upper lobe compatible with changes of external beam radiation. Stable 5 mm right lower lobe lung nodule, image 33/series 5. There is a small subpleural nodule along the oblique fissure which measures 4 mm, image 30/series 5. Although not seen on prior exam, this has an appearance favoring small fissural lymph node.  Upper Abdomen: Low-attenuation structure within the left lobe of liver measures 9 mm and is unchanged. The gallbladder appears normal. There is mild intrahepatic bile duct dilatation. The common bile duct is increased in caliber measuring 1.3 cm, image 55/series 2. This is stable from previous exam. The visualized portions of the pancreas are unremarkable. Spleen negative. Normal adrenal glands. The visualized portions of the kidney ears are normal.  Musculoskeletal: Review of the visualized bony structures is negative for aggressive lytic or sclerotic bone lesion. Degenerative disc disease noted within the thoracic spine.  IMPRESSION: 1. No significant change compared with 02/25/2014. 2. Small nodule along the oblique fissure is favored to represent a intrapulmonary lymph node. Attention on follow-up imaging advise. 3. Aortic atherosclerosis and LAD Coronary artery calcification.   Electronically Signed   By: TKerby MoorsM.D.   On: 03/19/2015 12:28    ASSESSMENT:  59y.o. [place] woman with stage IV breast cancer manifested chiefly by loco-regional nodal disease (neck, chest), without lung, liver, bone, or brain involvement.  (1) Status post right lumpectomy and  sentinel lymph node sampling 03/04/2002 for 2 separate foci of invasive ductal carcinoma, mpT2 pN1 or stage IIB, both foci grade 3, both estrogen and progesterone receptor positive, both HercepTest 3+, Mib-1 56%  (2) Reexcision for margins 05/27/2002 showed no residual cancer in the breast.  (3) The patient refused  adjuvant systemic therapy.  (4) Adjuvant radiation treatment completed 08/14/2002.  (5) recurrence in the right breast in 02/2004 showing a morphologically different tumor, again grade 3, again estrogen and progesterone receptor negative, with an MIB-1 of 14% and Herceptest 3+.  (5) Between 03/2004 and 07/2004 she received dose dense Doxorubicin/Cyclophosphamide x 4 given with trastuzumab, followed by weekly Docetaxel x 8, again given with trastuzumab.  (6) Right mastectomy 07/13/2004 showed scattered microscopic foci of residual disease over an area  greater than 5 cm. Margins were negative.  (7) Postoperative Docetaxel continued until 09/2004.  (8) Trastuzumab (Herceptin) given 08/2004 through 01/2012 with some brief interruptions.  (9) Isolated right cervical nodal recurrence 10/2005, treated with radiation to the right supraclavicular area (total 41.5 gray) completed 12/29/2005.  (10) Navelbine given together with Herceptin  11/2005 through 03/2006.  (9) Left mastectomy 02/13/2006 for ductal carcinoma in situ, grade 2, estrogen and progesterone receptor negative, with negative margins; 0 of 3 lymph nodes involved  (10) treated with Lapatinib and Capecitabine before 10/2009, for an unclear duration and with unclear results (cannot locate data on chart review).  (11) Status post right supraclavicular lymph node biopsy 09/2010 again positive for an invasive ductal carcinoma, estrogen and progesterone receptor negative, HER-2 positive by CISH with a ratio 4.25.  (12) Navelbine given together with Herceptin between 05/2011 and 11/2011.  (13) Carboplatin/ Gemcitabine/ Herceptin  given for 2 cycles, in 12/2011 and 01/2012.  (14) TDM-1 ([place]) started 02/2012. Last dose 10/02/2013 after which the patient discontinued treatment at her own discretion. Most recent echo on December 2014 showed a well preserved ejection fraction.  (15) Deep vein thrombosis of the right upper extremity documented 04/20/2011.  She completed anticoagulant therapy with Coumadin on 03/25/2013.  (16) Chronic right upper extremity lymphedema.  (17) Right chest port-a-cath removal due to infection on 01/28/2013. Left chest Port-A-Cath placed on 04/08/2013; be flushed every 6 weeks   PLAN: I was delighted to give [name] a copy of her CT, which is stable. This still leaves [name] with the problem of the left upper extremity lymphedema, which is minimally improved if at all despite intensive physical therapy.  [name] tells me she is obtaining a mechanical pump that she can use overnight. She hopes to have this available within a couple of weeks. It would be reasonable to reevaluate the arm a few weeks after she has been on that therapy. Accordingly I am making her a return appointment with me for sometime late June.  In the meantime we will continue physical therapy as before.  The patient has a palpable mass in the left axilla. This was not commented on in the recent CT scan. When I review cuts through that area there appears to be a lymph node with a fatty hilum. The patient had a left-sided port, which has since been removed. At any rate today [name] did not want any further evaluation of that lymph node although she understands that ultrasonography would be painless and involves no radiation. We will pay particular attention to that at the next visit here.  [name] Cruel, MD  03/20/2015 3:22 PM

## 2015-03-20 NOTE — Telephone Encounter (Signed)
Gave patient avs report and appointments for June.  °

## 2015-03-23 ENCOUNTER — Ambulatory Visit: Payer: Commercial Managed Care - PPO | Admitting: Physical Therapy

## 2015-03-23 DIAGNOSIS — I972 Postmastectomy lymphedema syndrome: Secondary | ICD-10-CM | POA: Diagnosis not present

## 2015-03-23 NOTE — Therapy (Signed)
[place], [place], [address] Phone: [phone]   Fax:  [phone]  Physical Therapy Treatment  Patient Details  Name: [name] MRN: [id number] Date of Birth: 1956-09-01 Referring Provider:  [name], MD  Encounter Date: 03/23/2015      PT End of Session - 03/23/15 1649    Visit Number 19   Number of Visits 30   Date for PT Re-Evaluation 04/24/15   PT Start Time 8938   PT Stop Time 0934   PT Time Calculation (min) 47 min   Activity Tolerance Patient tolerated treatment well   Behavior During Therapy [facility] for tasks assessed/performed      Past Medical History  Diagnosis Date  . DVT (deep venous thrombosis) 10/07/2011  . Breast cancer   . Hypertension   . Rheumatic fever     Past Surgical History  Procedure Laterality Date  . Mastectomy      bilateral  . Breast surgery    . Port a cath revision      There were no vitals filed for this visit.  Visit Diagnosis:  Postmastectomy lymphedema - Plan: PT plan of care cert/re-cert      Subjective Assessment - 03/23/15 0851    Subjective "There's no cancer!"  But arm is still red and it still hurts.  He gave me a new antibiotic and I've been taking it.   Currently in Pain? Yes   Pain Score 0-No pain  up to 10   Pain Location Arm   Pain Orientation Right   Pain Descriptors / Indicators Heaviness   Aggravating Factors  swollen   Pain Relieving Factors praying on it; slept in nighttime compression garment                         [facility] Adult PT Treatment/Exercise - 03/23/15 0001    Manual Therapy   Manual Lymphatic Drainage (MLD) In supine, short neck, superficial and deep abdomen, left axilla and anterior interaxillary anastomosis, right groin and axillo-inguinal anastomosis, and right UE from fingers to shoulder; in left sidelying, posterior interaxillary anastomosis and right axillo-inguinal anastomosis.   Compression Bandaging Lotion applied, thick stockinette, Elastomull x 2 and tape to all 5 fingers, peach Medi large dot foam around upper arm with lined medi foam and peach Medi small dot foam around forearm, Artiflex x 2, 1-6, 1-10 and 2-12 cm short stretch compression banadages from hand to axilla.                    Short Term Clinic Goals - 03/13/15 1221    CC Short Term Goal  #2   Title Rt. UE circumference at 15 cm. proximal to ulnar styloid will reduce by at least 2 cm.   Status On-going             Long Term Clinic Goals - 03/13/15 1221    CC Long Term Goal  #1   Title Rt. UE circumference at 15 cm. proximal to olecranon will reduce by at least 2 cm.   Status On-going   CC Long Term Goal  #2   Title Rt. UE circumference at 15 cm. proximal to ulnar styloid will reduce by at least 3 cm.   Status On-going   CC Long Term Goal  #3   Title Pt. will have been measured for new daytime compression garments.  meausred 03/12/2015   Status Achieved  Plan - 03/23/15 1650    Clinical Impression Statement Patient pleased that her CT scan was clear for any cancer, but remains frustrated by the swelling and redness in right arm, as well as the significant fibrosis that has resulted and  her limited ROM that has also resulted.  She started an antibiotic again recently.  Arm remains extremely fibrotic despite bandaging that includes foam to soften that.  Measurements not done this session.   Pt will benefit from skilled therapeutic intervention in order to improve on the following deficits Increased edema;Decreased knowledge of precautions;Impaired UE functional use   Rehab Potential Good   PT Frequency 3x / week   PT Duration 4 weeks   PT Treatment/Interventions Manual lymph drainage;Compression bandaging   PT Next Visit Plan remeasure and continue complete decongestive therapy; reassess ROM        Problem List Patient Active Problem List   Diagnosis Date  Noted  . Left arm pain 08/18/2014  . Breast cancer of upper-inner quadrant of right female breast 10/03/2013  . Post-lymphadenectomy lymphedema of arm 08/15/2013  . Port or reservoir infection 01/29/2013  . DVT (deep venous thrombosis) 10/07/2011  . Chest pain 09/10/2009  . Secondary cardiomyopathy 09/02/2009  . Essential hypertension 02/26/2009  . GERD 02/26/2009    Exie Chrismer 03/23/2015, 4:55 PM  [place], [place], [address] Phone: [phone]   Fax:  [place], PT 03/23/2015 4:55 PM

## 2015-03-24 ENCOUNTER — Telehealth: Payer: Self-pay | Admitting: *Deleted

## 2015-03-24 NOTE — Telephone Encounter (Signed)
This RN spoke with pt per call to discuss use of current antibiotic - per call this RN informed pt she was seen yesterday by the lymphedema clinic and per documentation she has extensive swelling but not an infection.  Goal of the daily antibiotic is to help prevent an infection. She will be undergoing PT to her arm 3x a week and per therapist her arm will be monitored for any concerns.  [name] stated " so this redness isn't an infection ?"  Per review- [name] denies any fevers- arm is red but currently wrapped per lymphedema clinic and overall not any different then yesterday.  This RN validated pt's concerns and reiterated need to stay on daily antibiotic for best outcome and maintain PT.  Per end of call [name] verbalized agreement to the above as well as to call if she develops a fever greater then 100.5.

## 2015-03-24 NOTE — Telephone Encounter (Signed)
PT.'S ARM AND HAND IS STILL RED AND SWOLLEN. SHE DOES NOT WANT TO TAKE THE ANTIBIOTIC ANYMORE. WHEN PT.'S SISTER HAD THIS PROBLEM SHE WENT TO [place] AND RECEIVED ANTIBIOTICS IV WHICH TOOK CARE OF PROBLEM.

## 2015-03-25 ENCOUNTER — Ambulatory Visit: Payer: Commercial Managed Care - PPO

## 2015-03-25 DIAGNOSIS — M25611 Stiffness of right shoulder, not elsewhere classified: Secondary | ICD-10-CM

## 2015-03-25 DIAGNOSIS — I972 Postmastectomy lymphedema syndrome: Secondary | ICD-10-CM | POA: Diagnosis not present

## 2015-03-25 NOTE — Therapy (Signed)
[place], [place], [address] Phone: [phone]   Fax:  [phone]  Physical Therapy Treatment  Patient Details  Name: [name] MRN: [id number] Date of Birth: [birth year] Referring Provider:  Chauncey Cruel, MD  Encounter Date: 03/25/2015      PT End of Session - 03/25/15 1448    Visit Number 20   Number of Visits 30   Date for PT Re-Evaluation 04/24/15   PT Start Time 1351   PT Stop Time 1440   PT Time Calculation (min) 49 min      Past Medical History  Diagnosis Date  . DVT (deep venous thrombosis) 10/07/2011  . Breast cancer   . Hypertension   . Rheumatic fever     Past Surgical History  Procedure Laterality Date  . Mastectomy      bilateral  . Breast surgery    . Port a cath revision      There were no vitals filed for this visit.  Visit Diagnosis:  Postmastectomy lymphedema  Stiffness of joint, shoulder region, right      Subjective Assessment - 03/25/15 1359    Subjective I kept my bandage on. But when I took it off there was some blood on the peach foam. Just a little stinging feeling at my upper arm. I got a sling for my arm to wear to help me keep the bandage on.    Currently in Pain? Yes   Pain Score 1    Pain Location Arm   Pain Orientation Upper;Right   Pain Descriptors / Indicators Other (Comment)  Swollen   Aggravating Factors  swollen   Pain Relieving Factors being wrapped               LYMPHEDEMA/ONCOLOGY QUESTIONNAIRE - 03/25/15 1402    Right Upper Extremity Lymphedema   15 cm Proximal to Olecranon Process 39.6 cm   10 cm Proximal to Olecranon Process 39.8 cm   Olecranon Process 39.9 cm   15 cm Proximal to Ulnar Styloid Process 35.6 cm   10 cm Proximal to Ulnar Styloid Process 29.3 cm   Just Proximal to Ulnar Styloid Process 23.7 cm   Across Hand at [employer] 21.5 cm   At [place] of 2nd Digit 7.5 cm                  [facility]  Adult PT Treatment/Exercise - 03/25/15 0001    Manual Therapy   Manual Lymphatic Drainage (MLD) In supine, short neck, superficial and deep abdomen, left axilla and anterior interaxillary anastomosis, right groin and axillo-inguinal anastomosis, and right UE from fingers to shoulder; in left sidelying, posterior interaxillary anastomosis and right axillo-inguinal anastomosis.   Compression Bandaging Lotion applied, thick stockinette, Elastomull x1 and tape to all 5 fingers, peach Medi large dot foam around upper arm with Allevyn bandage at open sores ,lined medi foam and peach Medi small dot foam around forearm, Artiflex x 2, 1-6, 1-10 and 2-12 cm short stretch compression banadages from hand to axilla.                    Short Term Clinic Goals - 03/13/15 1221    CC Short Term Goal  #2   Title Rt. UE circumference at 15 cm. proximal to ulnar styloid will reduce by at least 2 cm.   Status On-going             Long Term Clinic Goals -  03/13/15 1221    CC Long Term Goal  #1   Title Rt. UE circumference at 15 cm. proximal to olecranon will reduce by at least 2 cm.   Status On-going   CC Long Term Goal  #2   Title Rt. UE circumference at 15 cm. proximal to ulnar styloid will reduce by at least 3 cm.   Status On-going   CC Long Term Goal  #3   Title Pt. will have been measured for new daytime compression garments.  meausred 03/12/2015   Status Achieved            Plan - 2015-03-26 1448    Clinical Impression Statement Pt happy today with progress, and though arm is still fibrotic pt did have some reductions with circumference measurements. Had some open sores at medial upper arm that were bleeding upon removal of bandage so placed Allevyn bandage here and did not put foam directly on top of that area today.    Pt will benefit from skilled therapeutic intervention in order to improve on the following deficits Increased edema;Decreased knowledge of precautions;Impaired UE  functional use   Rehab Potential Good   Clinical Impairments Affecting Rehab Potential --   PT Frequency 3x / week   PT Duration 4 weeks   PT Treatment/Interventions Manual lymph drainage;Compression bandaging   PT Next Visit Plan  continue complete decongestive therapy; reassess ROM   Recommended Other Services Pt received pump but hasnt had demo yet.   Consulted and Agree with Plan of Care Patient          G-Codes - Mar 26, 2015 1622    Functional Assessment Tool Used Clinical Judgement   Functional Limitation Carrying, moving and handling objects   Carrying, Moving and Handling Objects Current Status [phone]) At least 20 percent but less than 40 percent impaired, limited or restricted   Carrying, Moving and Handling Objects Goal Status ([id number]) At least 1 percent but less than 20 percent impaired, limited or restricted      Problem List Patient Active Problem List   Diagnosis Date Noted  . Left arm pain 08/18/2014  . Breast cancer of upper-inner quadrant of right female breast 10/03/2013  . Post-lymphadenectomy lymphedema of arm 08/15/2013  . Port or reservoir infection 01/29/2013  . DVT (deep venous thrombosis) 10/07/2011  . Chest pain 09/10/2009  . Secondary cardiomyopathy 09/02/2009  . Essential hypertension 02/26/2009  . GERD 02/26/2009    [name], PT 2015/03/26, 4:26 PM  [place], [place], [address] Phone: [phone]   Fax:  [phone]     G-code completed today for 10th visit by PT.  [name], [place] 2015-03-26 4:26 PM

## 2015-03-27 ENCOUNTER — Ambulatory Visit: Payer: Commercial Managed Care - PPO | Admitting: Physical Therapy

## 2015-03-27 DIAGNOSIS — I972 Postmastectomy lymphedema syndrome: Secondary | ICD-10-CM

## 2015-03-27 NOTE — Therapy (Signed)
[place], [place], [address] Phone: [phone]   Fax:  [phone]  Physical Therapy Treatment  Patient Details  Name: [name] MRN: [id number] Date of Birth: 1956/09/10 Referring Provider:  [name], MD  Encounter Date: 03/27/2015      PT End of Session - 03/27/15 1259    Visit Number 21   Number of Visits 30   Date for PT Re-Evaluation 04/24/15   PT Start Time 0846   PT Stop Time 0934   PT Time Calculation (min) 48 min   Activity Tolerance Patient tolerated treatment well   Behavior During Therapy [facility] for tasks assessed/performed      Past Medical History  Diagnosis Date  . DVT (deep venous thrombosis) 10/07/2011  . Breast cancer   . Hypertension   . Rheumatic fever     Past Surgical History  Procedure Laterality Date  . Mastectomy      bilateral  . Breast surgery    . Port a cath revision      There were no vitals filed for this visit.  Visit Diagnosis:  Postmastectomy lymphedema      Subjective Assessment - 03/27/15 0847    Subjective kept the bandages on   Currently in Pain? No/denies            [facility] PT Assessment - 03/27/15 0001    Observation/Other Assessments   Skin Integrity still with oozing wound at posterior upper arm at level of axilla, which had oozed into Allevyn bandage that was placed last time.  Patient is on an antibiotic.  Allevyn placed over this again.                     [facility] Adult PT Treatment/Exercise - 03/27/15 0001    Manual Therapy   Manual Lymphatic Drainage (MLD) In supine, short neck, deep breathing, left axilla and anterior interaxillary anastomosis, right groin and axillo-inguinal anastomosis, and right UE from fingers to shoulder; in left sidelying, posterior interaxillary anastomosis and right axillo-inguinal anastomosis.   Compression Bandaging Lotion applied.  Bandaing of right UE with Allevyn bandage at  posterior upper arm area of wound; stockinette, Elastomull on all five fingers with tape; peach Medi lined foam at posterior elbow; Artiflex x 2, and four short stretch bandages hand to shoulder.                   Short Term Clinic Goals - 03/13/15 1221    CC Short Term Goal  #2   Title Rt. UE circumference at 15 cm. proximal to ulnar styloid will reduce by at least 2 cm.   Status On-going             Long Term Clinic Goals - 03/13/15 1221    CC Long Term Goal  #1   Title Rt. UE circumference at 15 cm. proximal to olecranon will reduce by at least 2 cm.   Status On-going   CC Long Term Goal  #2   Title Rt. UE circumference at 15 cm. proximal to ulnar styloid will reduce by at least 3 cm.   Status On-going   CC Long Term Goal  #3   Title Pt. will have been measured for new daytime compression garments.  meausred 03/12/2015   Status Achieved            Plan - 03/27/15 1259    Clinical Impression Statement Arm has areas where tissue has softened at  upper forearm!  Hand looks significantly better (smaller) today.  Peach large dot and small dot Medi foam not placed today as arm still had indentations in it from just removing bandages this morning.   Pt will benefit from skilled therapeutic intervention in order to improve on the following deficits Increased edema;Decreased knowledge of precautions;Impaired UE functional use   Rehab Potential Good   PT Frequency 3x / week   PT Duration 4 weeks   PT Treatment/Interventions Manual lymph drainage;Compression bandaging   PT Next Visit Plan  continue complete decongestive therapy; reassess ROM   Recommended Other Services Will be shown how to use pump once therapy (bandaging) is completed, which will be once patient gets her new garments.  We will need to let the [employer] rep know when she is done.   Consulted and Agree with Plan of Care Patient        Problem List Patient Active Problem List   Diagnosis Date Noted   . Left arm pain 08/18/2014  . Breast cancer of upper-inner quadrant of right female breast 10/03/2013  . Post-lymphadenectomy lymphedema of arm 08/15/2013  . Port or reservoir infection 01/29/2013  . DVT (deep venous thrombosis) 10/07/2011  . Chest pain 09/10/2009  . Secondary cardiomyopathy 09/02/2009  . Essential hypertension 02/26/2009  . GERD 02/26/2009    Manveer Gomes 03/27/2015, 1:02 PM  [place], [place], [address] Phone: [phone]   Fax:  [place], PT 03/27/2015 1:03 PM

## 2015-03-30 ENCOUNTER — Ambulatory Visit: Payer: Commercial Managed Care - PPO | Admitting: Physical Therapy

## 2015-03-30 DIAGNOSIS — I972 Postmastectomy lymphedema syndrome: Secondary | ICD-10-CM

## 2015-03-30 NOTE — Therapy (Signed)
[place], [place], [address] Phone: [phone]   Fax:  [phone]  Physical Therapy Treatment  Patient Details  Name: [name] MRN: [id number] Date of Birth: Jul 02, 1956 Referring Provider:  [name], MD  Encounter Date: 03/30/2015      PT End of Session - 03/30/15 1241    Visit Number 22   Number of Visits 30   Date for PT Re-Evaluation 04/24/15   PT Start Time 1024   PT Stop Time 1107   PT Time Calculation (min) 43 min   Activity Tolerance Patient tolerated treatment well   Behavior During Therapy [facility] for tasks assessed/performed      Past Medical History  Diagnosis Date  . DVT (deep venous thrombosis) 10/07/2011  . Breast cancer   . Hypertension   . Rheumatic fever     Past Surgical History  Procedure Laterality Date  . Mastectomy      bilateral  . Breast surgery    . Port a cath revision      There were no vitals filed for this visit.  Visit Diagnosis:  Postmastectomy lymphedema      Subjective Assessment - 03/30/15 1023    Subjective Bandages came off about 2 a.m. and put bedtime sleeve on.   Currently in Pain? No/denies            [facility] PT Assessment - 03/30/15 0001    Observation/Other Assessments   Skin Integrity wound at right upper arm, posterior superior aspect is still weeping some fluid; when this was washed today with washcloth by PT, skin distal to the original wound sloughed off, leaving a longer open sore, which was covered with Allevyn bandage.                     [facility] Adult PT Treatment/Exercise - 03/30/15 0001    Manual Therapy   Manual Lymphatic Drainage (MLD) In supine, short neck, superficial and deep abdomen, left axilla and anterior interaxillary anastomosis, right groin and axillo-inguinal anastomosis, and right UE from fingers to shoulder; in left sidelying, posterior interaxillary anastomosis and right axillo-inguinal  anastomosis.   Compression Bandaging Lotion applied.  Bandaing of right UE with Allevyn bandage at posterior upper arm area of wound; stockinette, Elastomull on all five fingers with tape; peach Medi dotted foam at forearm, peach Medi lined foam at posterior elbow; Artiflex x 2, and four short stretch bandages hand to shoulder.                   Short Term Clinic Goals - 03/13/15 1221    CC Short Term Goal  #2   Title Rt. UE circumference at 15 cm. proximal to ulnar styloid will reduce by at least 2 cm.   Status On-going             Long Term Clinic Goals - 03/13/15 1221    CC Long Term Goal  #1   Title Rt. UE circumference at 15 cm. proximal to olecranon will reduce by at least 2 cm.   Status On-going   CC Long Term Goal  #2   Title Rt. UE circumference at 15 cm. proximal to ulnar styloid will reduce by at least 3 cm.   Status On-going   CC Long Term Goal  #3   Title Pt. will have been measured for new daytime compression garments.  meausred 03/12/2015   Status Achieved  Plan - 03/30/15 1242    Clinical Impression Statement Arm still looks a little better, especially fingers and hand today.  Upper posterior arm wound increased in size today when skin sloughed off when therapist washed the wound with a washcloth.  Will need to monitor this and consider having her see her MD about it if not improving at next visit.   Pt will benefit from skilled therapeutic intervention in order to improve on the following deficits Increased edema;Decreased knowledge of precautions;Impaired UE functional use   Rehab Potential Good   PT Treatment/Interventions Manual lymph drainage;Compression bandaging   PT Next Visit Plan  check wound and send to MD if worse; remeasure arm; continue complete decongestive therapy; reassess ROM   Consulted and Agree with Plan of Care Patient        Problem List Patient Active Problem List   Diagnosis Date Noted  . Left arm pain  08/18/2014  . Breast cancer of upper-inner quadrant of right female breast 10/03/2013  . Post-lymphadenectomy lymphedema of arm 08/15/2013  . Port or reservoir infection 01/29/2013  . DVT (deep venous thrombosis) 10/07/2011  . Chest pain 09/10/2009  . Secondary cardiomyopathy 09/02/2009  . Essential hypertension 02/26/2009  . GERD 02/26/2009    Idelia Caudell 03/30/2015, 12:44 PM  [place], [place], [address] Phone: [phone]   Fax:  [place], PT 03/30/2015 12:44 PM

## 2015-04-01 ENCOUNTER — Ambulatory Visit: Payer: Commercial Managed Care - PPO | Admitting: Physical Therapy

## 2015-04-01 DIAGNOSIS — I972 Postmastectomy lymphedema syndrome: Secondary | ICD-10-CM | POA: Diagnosis not present

## 2015-04-01 DIAGNOSIS — M25611 Stiffness of right shoulder, not elsewhere classified: Secondary | ICD-10-CM

## 2015-04-01 NOTE — Therapy (Signed)
[place], [place], [address] Phone: [phone]   Fax:  [phone]  Physical Therapy Treatment  Patient Details  Name: [name] MRN: [id number] Date of Birth: 09-26-1956 Referring Provider:  Chauncey Cruel, MD  Encounter Date: 04/01/2015      PT End of Session - 04/01/15 1214    Visit Number 23   Number of Visits 30   Date for PT Re-Evaluation 04/24/15   PT Start Time 1110   PT Stop Time 1206   PT Time Calculation (min) 56 min   Activity Tolerance Patient tolerated treatment well   Behavior During Therapy [facility] for tasks assessed/performed      Past Medical History  Diagnosis Date  . DVT (deep venous thrombosis) 10/07/2011  . Breast cancer   . Hypertension   . Rheumatic fever     Past Surgical History  Procedure Laterality Date  . Mastectomy      bilateral  . Breast surgery    . Port a cath revision      There were no vitals filed for this visit.  Visit Diagnosis:  Postmastectomy lymphedema  Stiffness of joint, shoulder region, right      Subjective Assessment - 04/01/15 1110    Subjective Got her new compression sleeves and gloves (two of each); wore glove and TG soft in today because she had to take the bandages off yesterday because they were drooping.   Currently in Pain? No/denies            [facility] PT Assessment - 04/01/15 0001    Observation/Other Assessments   Skin Integrity Wound at right upper posterior arm looks slightly better today.  There was drainage into the bandage, but no foul odor and wound looks red.  It was also checked by Maudry Diego, PT.           LYMPHEDEMA/ONCOLOGY QUESTIONNAIRE - 04/01/15 1112    Right Upper Extremity Lymphedema   10 cm Proximal to Olecranon Process 39.2 cm   Olecranon Process 41.1 cm   15 cm Proximal to Ulnar Styloid Process 36.6 cm   10 cm Proximal to Ulnar Styloid Process 30.2 cm   Just Proximal to Ulnar Styloid  Process 24 cm   Across Hand at [employer] 21.5 cm   At [place] of 2nd Digit 7.3 cm                  [facility] Adult PT Treatment/Exercise - 04/01/15 0001    Manual Therapy   Manual Lymphatic Drainage (MLD) In supine, short neck, superficial and deep abdomen, left axilla and anterior interaxillary anastomosis, right groin and axillo-inguinal anastomosis, and right UE from fingers to shoulder; in left sidelying, posterior interaxillary anastomosis and right axillo-inguinal anastomosis.   Compression Bandaging LotionBandaging of right UE with Allevyn bandage at posterior upper arm area of wound; stockinette, Elastomull on all five fingers with tape; peach Medi dotted foam at forearm and at posterior elbow; Artiflex x 2, and four short stretch bandages hand to shoulder.                   Short Term Clinic Goals - 03/13/15 1221    CC Short Term Goal  #2   Title Rt. UE circumference at 15 cm. proximal to ulnar styloid will reduce by at least 2 cm.   Status On-going             Long Term Clinic Goals - 03/13/15 1221  CC Long Term Goal  #1   Title Rt. UE circumference at 15 cm. proximal to olecranon will reduce by at least 2 cm.   Status On-going   CC Long Term Goal  #2   Title Rt. UE circumference at 15 cm. proximal to ulnar styloid will reduce by at least 3 cm.   Status On-going   CC Long Term Goal  #3   Title Pt. will have been measured for new daytime compression garments.  meausred 03/12/2015   Status Achieved            Plan - 04/01/15 1214    Clinical Impression Statement patient with slight but insignificant changes in circumference measurements from last week; arm still very fibrotic and with wound at posterior aspect that looks slightly better today.  Pt. has received her compression sleeve and glove and vest.                                                       Pt will benefit from skilled therapeutic intervention in order to improve on the following  deficits Increased edema;Decreased knowledge of precautions;Impaired UE functional use   Rehab Potential Good   PT Frequency 3x / week   PT Duration 4 weeks   PT Treatment/Interventions Manual lymph drainage;Compression bandaging   PT Next Visit Plan check wound again; probable last visit next time, so will go over with patient how to care for and keep tabs on wound   PT Home Exercise Plan pt. was encouraged to do remedial ROM at all joints for lymphedema while in bandages   Consulted and Agree with Plan of Care Patient        Problem List Patient Active Problem List   Diagnosis Date Noted  . Left arm pain 08/18/2014  . Breast cancer of upper-inner quadrant of right female breast 10/03/2013  . Post-lymphadenectomy lymphedema of arm 08/15/2013  . Port or reservoir infection 01/29/2013  . DVT (deep venous thrombosis) 10/07/2011  . Chest pain 09/10/2009  . Secondary cardiomyopathy 09/02/2009  . Essential hypertension 02/26/2009  . GERD 02/26/2009    Jibri Schriefer 04/01/2015, 12:18 PM  [place], [place], [address] Phone: [phone]   Fax:  [place], PT 04/01/2015 12:18 PM

## 2015-04-03 ENCOUNTER — Other Ambulatory Visit: Payer: Self-pay | Admitting: *Deleted

## 2015-04-03 ENCOUNTER — Ambulatory Visit: Payer: Commercial Managed Care - PPO | Admitting: Physical Therapy

## 2015-04-03 DIAGNOSIS — I972 Postmastectomy lymphedema syndrome: Secondary | ICD-10-CM

## 2015-04-03 MED ORDER — CEPHALEXIN 500 MG PO CAPS: 500.0000 mg | ORAL_CAPSULE | Freq: Two times a day (BID) | ORAL | Status: DC

## 2015-04-03 NOTE — Therapy (Signed)
[place], [place], [address] Phone: [phone]   Fax:  [phone]  Physical Therapy Treatment  Patient Details  Name: [name] MRN: [id number] Date of Birth: 04/03/1956 Referring Provider:  Chauncey Cruel, MD  Encounter Date: 04/03/2015      PT End of Session - 04/03/15 1300    Visit Number 24   Number of Visits 30   Date for PT Re-Evaluation 04/24/15   PT Start Time 0850   PT Stop Time 0946   PT Time Calculation (min) 56 min   Activity Tolerance Patient tolerated treatment well   Behavior During Therapy [facility] for tasks assessed/performed      Past Medical History  Diagnosis Date  . DVT (deep venous thrombosis) 10/07/2011  . Breast cancer   . Hypertension   . Rheumatic fever     Past Surgical History  Procedure Laterality Date  . Mastectomy      bilateral  . Breast surgery    . Port a cath revision      There were no vitals filed for this visit.  Visit Diagnosis:  Postmastectomy lymphedema      Subjective Assessment - 04/03/15 0851    Subjective (p) This started coming down this morning.   Currently in Pain? (p) No/denies                         [facility] Adult PT Treatment/Exercise - 04/03/15 0001    Manual Therapy   Edema Management Allevyn bandaged was removed; wound was oozing as before, but now with a foul odor.  Spoke to Dr. [name] nurse who was going to get a new prescription called in right away for patient to pick up this morning vs. infection.   Manual Lymphatic Drainage (MLD) In supine, short neck, deep breathing, left axilla and anterior interaxillary anastomosis, right groin and axillo-inguinal anastomosis, and right UE from fingers to shoulder.   Compression Bandaging Lotion applied.  Bandaging of right UE with nonstick gauze bandage at posterior upper arm area of wound (affixed with paper tape); stockinette, Elastomull on all five fingers  with tape; peach Medi dotted foam at forearm and at posterior elbow; Artiflex x 2, and four short stretch bandages hand to shoulder.                PT Education - 04/03/15 1259    Education provided Yes   Education Details use nonstick gauze or regular gauze bandage if that worked better for wound (keeping it from being so moist, which seems to macerate that skin); otherwise, use Allevyn.  Supplies given to patient for this for over the weekend.   Person(s) Educated Patient   Methods Explanation   Comprehension Verbalized understanding           Short Term Clinic Goals - 03/13/15 1221    CC Short Term Goal  #2   Title Rt. UE circumference at 15 cm. proximal to ulnar styloid will reduce by at least 2 cm.   Status On-going             Long Term Clinic Goals - 03/13/15 1221    CC Long Term Goal  #1   Title Rt. UE circumference at 15 cm. proximal to olecranon will reduce by at least 2 cm.   Status On-going   CC Long Term Goal  #2   Title Rt. UE circumference at 15 cm. proximal to ulnar styloid will  reduce by at least 3 cm.   Status On-going   CC Long Term Goal  #3   Title Pt. will have been measured for new daytime compression garments.  meausred 03/12/2015   Status Achieved            Plan - 04/03/15 1300    Clinical Impression Statement Patient's right posterior upper arm wound looks better but has an odor today, so Dr. [name] nurse was consulted and was going to see about a new prescription for this.  Tried different bandage on it this morning as a result to try to avoid the moisture that causes maceration of that tissue.   Pt will benefit from skilled therapeutic intervention in order to improve on the following deficits Increased edema;Decreased knowledge of precautions;Impaired UE functional use   Rehab Potential Good   PT Frequency 3x / week   PT Duration 4 weeks   PT Treatment/Interventions Manual lymph drainage;Compression bandaging;ADLs/Self Care  Home Management;Patient/family education   PT Next Visit Plan check wound again and decide whether patient can be safely discharged   Consulted and Agree with Plan of Care Patient        Problem List Patient Active Problem List   Diagnosis Date Noted  . Left arm pain 08/18/2014  . Breast cancer of upper-inner quadrant of right female breast 10/03/2013  . Post-lymphadenectomy lymphedema of arm 08/15/2013  . Port or reservoir infection 01/29/2013  . DVT (deep venous thrombosis) 10/07/2011  . Chest pain 09/10/2009  . Secondary cardiomyopathy 09/02/2009  . Essential hypertension 02/26/2009  . GERD 02/26/2009    [name] 04/03/2015, 1:04 PM  [place], [place], [address] Phone: [phone]   Fax:  [place], PT 04/03/2015 1:04 PM

## 2015-04-07 ENCOUNTER — Ambulatory Visit: Payer: Commercial Managed Care - PPO | Admitting: Physical Therapy

## 2015-04-07 DIAGNOSIS — M25611 Stiffness of right shoulder, not elsewhere classified: Secondary | ICD-10-CM

## 2015-04-07 DIAGNOSIS — I972 Postmastectomy lymphedema syndrome: Secondary | ICD-10-CM

## 2015-04-07 NOTE — Patient Instructions (Signed)
Neck Side-Bending   Begin with chin level, head centered over spine. Slowly lower one ear toward shoulder keeping shoulder down. Hold __3__ seconds. Slowly return to starting position. Repeat to other side.  Repeat __5-10__ times to each side. Do 2-3 times a day. Also stretch by looking over right shoulder, then left.  Scapular Retraction (Standing)   With arms at sides, pinch shoulder blades together.  Hold 3 seconds. Repeat __5-10__ times per set.  Do __2-3__ sessions per day.   Active ROM Flexion    Raise arm to point to ceiling, keeping elbow straight. Hold __3__ seconds. Repeat _5-10___ times. Do _2-3___ sessions per day. Activity: Use this motion to reach for items on overhead shelves.     Flexion (Active)   Palm up, rest elbow on other hand. Bend as far as possible. Hold __3__ seconds. Repeat __5-10__ times. Do _2-3___ sessions per day. Can use other hand to bend elbow further if needed where bandage may limit end motion.  Copyright  VHI. All rights reserved.    Extension (Active With Finger Extension)   Llift hand with fingers straight, then bend wrist down.  Hold _3___ seconds each way. Repeat __5-10__ times. Do _2-3___ sessions per day.    AROM: Forearm Pronation / Supination   With right arm in handshake position, slowly rotate palm down. Relax. Then rotate palm up. Repeat __5-10__ times per set. Do __2-3__ sessions per day.  Copyright  VHI. All rights reserved.    AROM: Finger Flexion / Extension   Actively bend fingers of right hand. Start with knuckles furthest from palm, and slowly make a fist. Hold __3__ seconds. Relax. Then straighten fingers as far as possible. Repeat _5-10___ times per set.  Do __2-3__ sessions per day. Also spread fingers as far as able, then bring them back together.  Copyright  VHI. All rights reserved.   If any pain/tingling symptoms occur, try all of these exercises first to promote circulation. If symptoms do not  ease off, then remove bandages and bring all wrappings back to next appointment.    

## 2015-04-07 NOTE — Therapy (Signed)
[place], [place], [address] Phone: [phone]   Fax:  [phone]  Physical Therapy Treatment  Patient Details  Name: [name] MRN: [id number] Date of Birth: [birth year] Referring Provider:  Chauncey Cruel, MD  Encounter Date: 04/07/2015      PT End of Session - 04/07/15 1806    Visit Number 25   Number of Visits 30   Date for PT Re-Evaluation 04/24/15   PT Start Time 8185   PT Stop Time 1430   PT Time Calculation (min) 45 min      Past Medical History  Diagnosis Date  . DVT (deep venous thrombosis) 10/07/2011  . Breast cancer   . Hypertension   . Rheumatic fever     Past Surgical History  Procedure Laterality Date  . Mastectomy      bilateral  . Breast surgery    . Port a cath revision      There were no vitals filed for this visit.  Visit Diagnosis:  Postmastectomy lymphedema  Stiffness of joint, shoulder region, right      Subjective Assessment - 04/07/15 1802    Subjective Pt likes her compression sleeve and glove, but feels like the sleeve is too short. She doesn't know how to wear her compression bral    Currently in Pain? No/denies                         [facility] Adult PT Treatment/Exercise - 04/07/15 0001    Elbow Exercises   Elbow Flexion AROM;Right;10 reps   Elbow Extension AROM;10 reps;Right   Forearm Supination AROM;Right;10 reps   Forearm Pronation AROM;Right;10 reps   Neck Exercises: Standing   Other Standing Exercises neck ROM and shoulder retraction   Shoulder Exercises: Supine   Other Supine Exercises dowel rod flexion    Wrist Exercises   Other wrist exercises wrist , hand and fnger range of motion                PT Education - 04/07/15 1429    Education provided Yes   Education Details UE Remedial exericise for UE    Person(s) Educated Patient   Methods Demonstration;Handout;Explanation   Comprehension Verbalized  understanding;Need further instruction;Returned demonstration           Short Term Clinic Goals - 04/07/15 1811    CC Short Term Goal  #2   Title Rt. UE circumference at 15 cm. proximal to ulnar styloid will reduce by at least 2 cm.   Baseline 34.8 cm. at initial eval; 34.9 cm. on 02/25/15   Status On-going             [place] - 04/07/15 1811    CC Long Term Goal  #1   Title Rt. UE circumference at 15 cm. proximal to olecranon will reduce by at least 2 cm.   Status On-going   CC Long Term Goal  #2   Title Rt. UE circumference at 15 cm. proximal to ulnar styloid will reduce by at least 3 cm.   Status On-going   CC Long Term Goal  #3   Title Pt. will have been measured for new daytime compression garments.   Status Achieved   CC Long Term Goal  #4   Title Pt. will be independent in self-management of lymphedema, including manual lymph drainage.   Status On-going  Plan - 04/07/15 1807    Clinical Impression Statement pt continues with open area on right posterior upper arm with serous draining.  Firm fibrosis in hand, forearm and upper arm as well as chest.  Applies Belisse bra with serratus pad with axillary compression on patient.  She said it felt good and plans to wear it.  She wants to start using her Flexitouch. Applied sleeve and glove , It appears too short at wrist and question is class 2 is enough for amount of fibrosis pt has ??   PT Next Visit Plan review exercise.  follow up with flexitouch and carolyn to make sure pt has what she needs to follow through at home        Problem List Patient Active Problem List   Diagnosis Date Noted  . Left arm pain 08/18/2014  . Breast cancer of upper-inner quadrant of right female breast 10/03/2013  . Post-lymphadenectomy lymphedema of arm 08/15/2013  . Port or reservoir infection 01/29/2013  . DVT (deep venous thrombosis) 10/07/2011  . Chest pain 09/10/2009  . Secondary cardiomyopathy  09/02/2009  . Essential hypertension 02/26/2009  . GERD 02/26/2009   Donato Heinz. Owens Shark, PT  04/07/2015, [place], [place], [address] Phone: [phone]   Fax:  [phone]

## 2015-04-09 ENCOUNTER — Other Ambulatory Visit: Payer: Self-pay | Admitting: Oncology

## 2015-04-09 ENCOUNTER — Ambulatory Visit: Payer: Commercial Managed Care - PPO | Attending: Oncology

## 2015-04-09 DIAGNOSIS — I972 Postmastectomy lymphedema syndrome: Secondary | ICD-10-CM | POA: Insufficient documentation

## 2015-04-09 DIAGNOSIS — M25611 Stiffness of right shoulder, not elsewhere classified: Secondary | ICD-10-CM | POA: Insufficient documentation

## 2015-04-09 DIAGNOSIS — L989 Disorder of the skin and subcutaneous tissue, unspecified: Secondary | ICD-10-CM | POA: Insufficient documentation

## 2015-04-09 NOTE — Therapy (Signed)
[place], [place], [address] Phone: [phone]   Fax:  [phone]  Physical Therapy Treatment  Patient Details  Name: [name] MRN: [id number] Date of Birth: August 22, 1956 Referring Provider:  [name], MD  Encounter Date: 04/09/2015      PT End of Session - 04/09/15 1152    Visit Number 26   Number of Visits 30   Date for PT Re-Evaluation 04/24/15   PT Start Time 1103   PT Stop Time 1149   PT Time Calculation (min) 46 min   Activity Tolerance Patient tolerated treatment well   Behavior During Therapy [facility] for tasks assessed/performed      Past Medical History  Diagnosis Date  . DVT (deep venous thrombosis) 10/07/2011  . Breast cancer   . Hypertension   . Rheumatic fever     Past Surgical History  Procedure Laterality Date  . Mastectomy      bilateral  . Breast surgery    . Port a cath revision      There were no vitals filed for this visit.  Visit Diagnosis:  Postmastectomy lymphedema  Stiffness of joint, shoulder region, right      Subjective Assessment - 04/09/15 1139    Subjective Havent been wearing my sleeve, it doesnt fit. Wearing my new bra but dont feel like its helping. My Rt hand just feels really tight and I have tingling around the sore in my upper arm.    Currently in Pain? No/denies                         [facility] Adult PT Treatment/Exercise - 04/09/15 0001    Lumbar Exercises: Supine   Other Supine Lumbar Exercises Bil low trunk rotation 4 reps, 20 seconds holds  For muscle flexibility to promote lymphatic flow   Shoulder Exercises: Pulleys   Flexion 3 minutes  For muscle flexibility to promote lymphatic flow   Flexion Limitations Verbal cuing for correct echnique   Manual Therapy   Manual therapy comments Pt came in wearing Allevyn she applied that we gave her since last visit.    Edema Management After removing Allevyn pt had  applied washed the sore on her upper posterior arm due to being covered in clear drainage. Then trial of kinesiotape at Rt posterior forearm fan shape with anchor distal to elbowand I band at Rt chest incision.    Compression Bandaging Issued new bandages today: ABD pad at upper posterior Rt arm with thin stockinette, Elastomull to all 5 fingers, Artiflex x2, 1-6, 1-10 and 3-12 cm short stretch compression bandagees from hand to axilla (second 12-cm bandage was in [employer])                   Short Term Clinic Goals - 04/07/15 1811    CC Short Term Goal  #2   Title Rt. UE circumference at 15 cm. proximal to ulnar styloid will reduce by at least 2 cm.   Baseline 34.8 cm. at initial eval; 34.9 cm. on 02/25/15   Status On-going             [facility] - 04/07/15 1811    CC Long Term Goal  #1   Title Rt. UE circumference at 15 cm. proximal to olecranon will reduce by at least 2 cm.   Status On-going   CC Long Term Goal  #2   Title Rt. UE  circumference at 15 cm. proximal to ulnar styloid will reduce by at least 3 cm.   Status On-going   CC Long Term Goal  #3   Title Pt. will have been measured for new daytime compression garments.   Status Achieved   CC Long Term Goal  #4   Title Pt. will be independent in self-management of lymphedema, including manual lymph drainage.   Status On-going            Plan - 04/09/15 1152    Clinical Impression Statement Flexitouch rep is coming to pts house on Saturday to calibrate her unit for her and pt is eager to start using this. Her upper posterior Rt arm still presents with open wound and has clear drainage from this, even with Allevyn, the drainage was dried on surrounding skin, so washed pts arm for her today. Reapplied bandages as pts reports her sleeve doesnt fit and came in not wearing it and with drainage actively occuring Leone Payor, PT agreed better to bandage pt today using ABD pad at sore. Though did  issue an Allevyn and another ABD pad for pt to apply prn later after bandages come off.    Pt will benefit from skilled therapeutic intervention in order to improve on the following deficits Increased edema;Decreased knowledge of precautions;Impaired UE functional use   Rehab Potential Good   Clinical Impairments Affecting Rehab Potential Open sore that needs to covered with compression   PT Treatment/Interventions Manual lymph drainage;Compression bandaging;ADLs/Self Care Home Management;Patient/family education   PT Next Visit Plan review exercise, continue bandaging prn.   PT Home Exercise Plan pt. was encouraged to continue with remedial ROM at all joints for lymphedema while in bandages   Recommended Other Services Hoyle Sauer has been emailed to let her know sleeve needs to be reassessed for proper fit.   Consulted and Agree with Plan of Care Patient        Problem List Patient Active Problem List   Diagnosis Date Noted  . Left arm pain 08/18/2014  . Breast cancer of upper-inner quadrant of right female breast 10/03/2013  . Post-lymphadenectomy lymphedema of arm 08/15/2013  . Port or reservoir infection 01/29/2013  . DVT (deep venous thrombosis) 10/07/2011  . Chest pain 09/10/2009  . Secondary cardiomyopathy 09/02/2009  . Essential hypertension 02/26/2009  . GERD 02/26/2009    [name], PTA 04/09/2015, 12:05 PM  [place], [place], [address] Phone: [phone]   Fax:  [phone]

## 2015-04-13 ENCOUNTER — Ambulatory Visit: Payer: Commercial Managed Care - PPO | Admitting: Physical Therapy

## 2015-04-13 ENCOUNTER — Other Ambulatory Visit (HOSPITAL_BASED_OUTPATIENT_CLINIC_OR_DEPARTMENT_OTHER): Payer: Commercial Managed Care - PPO

## 2015-04-13 ENCOUNTER — Telehealth: Payer: Self-pay | Admitting: *Deleted

## 2015-04-13 ENCOUNTER — Encounter: Payer: Self-pay | Admitting: Nurse Practitioner

## 2015-04-13 ENCOUNTER — Telehealth: Payer: Self-pay | Admitting: Physical Therapy

## 2015-04-13 ENCOUNTER — Ambulatory Visit (HOSPITAL_BASED_OUTPATIENT_CLINIC_OR_DEPARTMENT_OTHER): Payer: Commercial Managed Care - PPO | Admitting: Nurse Practitioner

## 2015-04-13 ENCOUNTER — Telehealth: Payer: Self-pay | Admitting: Nurse Practitioner

## 2015-04-13 ENCOUNTER — Encounter: Payer: Self-pay | Admitting: Physical Therapy

## 2015-04-13 VITALS — BP 144/82 | HR 82 | Temp 99.1°F | Resp 16

## 2015-04-13 DIAGNOSIS — C778 Secondary and unspecified malignant neoplasm of lymph nodes of multiple regions: Secondary | ICD-10-CM | POA: Diagnosis not present

## 2015-04-13 DIAGNOSIS — G8929 Other chronic pain: Secondary | ICD-10-CM | POA: Diagnosis not present

## 2015-04-13 DIAGNOSIS — I972 Postmastectomy lymphedema syndrome: Secondary | ICD-10-CM

## 2015-04-13 DIAGNOSIS — C50211 Malignant neoplasm of upper-inner quadrant of right female breast: Secondary | ICD-10-CM

## 2015-04-13 DIAGNOSIS — I890 Lymphedema, not elsewhere classified: Principal | ICD-10-CM

## 2015-04-13 DIAGNOSIS — M25611 Stiffness of right shoulder, not elsewhere classified: Secondary | ICD-10-CM

## 2015-04-13 DIAGNOSIS — E8989 Other postprocedural endocrine and metabolic complications and disorders: Secondary | ICD-10-CM

## 2015-04-13 LAB — CBC WITH DIFFERENTIAL/PLATELET
BASO%: 0.3 % (ref 0.0–2.0)
Basophils Absolute: 0 10*3/uL (ref 0.0–0.1)
EOS%: 1.5 % (ref 0.0–7.0)
Eosinophils Absolute: 0.1 10*3/uL (ref 0.0–0.5)
HCT: 39.2 % (ref 34.8–46.6)
HGB: 13.1 g/dL (ref 11.6–15.9)
LYMPH%: 13.8 % — ABNORMAL LOW (ref 14.0–49.7)
MCH: 28.6 pg (ref 25.1–34.0)
MCHC: 33.4 g/dL (ref 31.5–36.0)
MCV: 85.6 fL (ref 79.5–101.0)
MONO#: 0.8 10*3/uL (ref 0.1–0.9)
MONO%: 8.3 % (ref 0.0–14.0)
NEUT#: 6.9 10*3/uL — ABNORMAL HIGH (ref 1.5–6.5)
NEUT%: 76.1 % (ref 38.4–76.8)
Platelets: 186 10*3/uL (ref 145–400)
RBC: 4.58 10*6/uL (ref 3.70–5.45)
RDW: 13.3 % (ref 11.2–14.5)
WBC: 9.1 10*3/uL (ref 3.9–10.3)
lymph#: 1.3 10*3/uL (ref 0.9–3.3)
nRBC: 0 % (ref 0–0)

## 2015-04-13 MED ORDER — OXYCODONE HCL 10 MG PO TABS: ORAL_TABLET | ORAL | Status: DC

## 2015-04-13 NOTE — Telephone Encounter (Signed)
TC from patient this morning stating " I have a hole in my arm".  States she has being getting PT for her lymphedema in her right arm. And there is an area of open skin in her upper arm that is draining. It has been noted by PT during her visits and re-dressed as needed.  This morning pt noted bloody drainage. She states she is on 2 antibiotics-Keflex and docycycline.  She wants the wound evaluated this morning. Will set her up to see Selena Lesser, NP and a CBC this am. Pt. Voiced understanding.

## 2015-04-13 NOTE — Assessment & Plan Note (Deleted)
Patient is status post mastectomy, chemotherapy therapy, and radiation therapy.  She is currently undergoing observation only.  She continues to suffer with chronic, intractable right upper extremity lymphedema.  She continues to see the lymphedema clinic on a frequent basis for treatment.  Recent restaging CT of the chest revealed no acute abnormality/findings.  The plan is for the patient to return for labs only on 04/30/2015.  Patient will return on 05/07/2015 for follow-up visit.

## 2015-04-13 NOTE — Progress Notes (Signed)
SYMPTOM MANAGEMENT CLINIC   HPI: [name] 59 y.o. female diagnosed with breast cancer.  Patient is status post mastectomy, chemotherapy, and radiation therapy.  Currently undergoing observation only.  Patient has been suffering with chronic right upper extremity.  Intractable lymphedema for the past several years.  She has chronic severe lymphedema, mild erythema, and tenderness to entire right arm all the way up into her shoulder.  Patient has such extreme lymphedema that she has developed some skin breakdown to her right upper posterior arm.  She has been undergoing dressing changes/care and treatment per the lymphedema clinic for the past several weeks with only moderate improvement.  Patient states that she also continues to take both Keflex and doxycycline and a biotics to prevent any type of infection.  Patient denies any recent fevers or chills.  HPI  ROS  Past Medical History  Diagnosis Date  . DVT (deep venous thrombosis) 10/07/2011  . Breast cancer   . Hypertension   . Rheumatic fever     Past Surgical History  Procedure Laterality Date  . Mastectomy      bilateral  . Breast surgery    . Port a cath revision      has Essential hypertension; Secondary cardiomyopathy; GERD; Chest pain; DVT (deep venous thrombosis); Port or reservoir infection; Post-lymphadenectomy lymphedema of arm; Breast cancer of upper-inner quadrant of right female breast; Left arm pain; and Chronic pain on her problem list.    is allergic to tramadol and hydrocodone-acetaminophen.    Medication List       This list is accurate as of: 04/13/15  5:39 PM.  Always use your most recent med list.               alprazolam 2 MG tablet  Commonly known as:  XANAX  Take 2 mg by mouth daily.     calcium-vitamin D 500-200 MG-UNIT per tablet  Commonly known as:  OSCAL WITH D  Take 1 tablet by mouth daily.     cephALEXin 500 MG capsule  Commonly known as:  KEFLEX  Take 1 capsule (500 mg  total) by mouth 2 (two) times daily.     doxycycline 100 MG tablet  Commonly known as:  VIBRA-TABS  Take 1 tablet (100 mg total) by mouth daily.     gabapentin 300 MG capsule  Commonly known as:  NEURONTIN  Take 1 capsule (300 mg total) by mouth at bedtime.     multivitamin capsule  Take 1 capsule by mouth daily.     Oxycodone HCl 10 MG Tabs  1 tab PO Q 6 hours PRN pain.  DO NOT TAKE ANY OTHER PAIN MEDS WHEN TAKING.     oxymorphone 10 MG tablet  Commonly known as:  OPANA  Take 1 tablet by mouth 3 (three) times daily as needed.     triamterene-hydrochlorothiazide 37.5-25 MG per capsule  Commonly known as:  DYAZIDE  TAKE 1 EACH (1 CAPSULE TOTAL) BY MOUTH EVERY MORNING.     vitamin C 100 MG tablet  Take 100 mg by mouth daily.         PHYSICAL EXAMINATION  Oncology Vitals 04/13/2015 03/20/2015 03/16/2015 03/02/2015 02/09/2015 01/19/2015 12/08/2014  Height - 168 cm 168 cm - - - 168 cm  Weight - 78.654 kg 78.79 kg - 78.926 kg - 81.738 kg  Weight (lbs) - 173 lbs 6 oz 173 lbs 11 oz - 174 lbs - 180 lbs 3 oz  BMI (kg/m2) - 27.99 kg/m2 28.04  kg/m2 - - - 29.08 kg/m2  Temp 99.1 99.1 98 98.1 98.5 97.8 98.2  Pulse 82 91 80 78 74 86 74  Resp 16 18 18  - 18 - 18  Resp (Historical as of 06/07/12) - - - - - - -  SpO2 100 - - - - - -  BSA (m2) - 1.91 m2 1.92 m2 - - - 1.95 m2   BP Readings from Last 3 Encounters:  04/13/15 144/82  03/20/15 139/80  03/16/15 136/87    Physical Exam  Constitutional: She is oriented to person, place, and time and well-developed, well-nourished, and in no distress.  HENT:  Head: Normocephalic and atraumatic.  Eyes: Conjunctivae and EOM are normal. Pupils are equal, round, and reactive to light. Right eye exhibits no discharge. Left eye exhibits no discharge. No scleral icterus.  Neck: Normal range of motion. Neck supple.  Pulmonary/Chest: Effort normal. No respiratory distress.  Musculoskeletal: Normal range of motion. She exhibits edema and tenderness.    Neurological: She is alert and oriented to person, place, and time. Gait normal.  Skin: Skin is warm and dry. No rash noted. There is erythema. No pallor.  On exam.-Patient does have severe right upper extremity lymphedema.  She has obvious skin breakdown to her right posterior upper arm; with serous drainage.  There is no active infection noted on exam; but patient does continue with both antibiotics as previously directed.    Psychiatric: Affect normal.  Nursing note and vitals reviewed.   LABORATORY DATA:. Appointment on 04/13/2015  Component Date Value Ref Range Status  . WBC 04/13/2015 9.1  3.9 - 10.3 10e3/uL Final  . NEUT# 04/13/2015 6.9* 1.5 - 6.5 10e3/uL Final  . HGB 04/13/2015 13.1  11.6 - 15.9 g/dL Final  . HCT 04/13/2015 39.2  34.8 - 46.6 % Final  . Platelets 04/13/2015 186  145 - 400 10e3/uL Final  . MCV 04/13/2015 85.6  79.5 - 101.0 fL Final  . MCH 04/13/2015 28.6  25.1 - 34.0 pg Final  . MCHC 04/13/2015 33.4  31.5 - 36.0 g/dL Final  . RBC 04/13/2015 4.58  3.70 - 5.45 10e6/uL Final  . RDW 04/13/2015 13.3  11.2 - 14.5 % Final  . lymph# 04/13/2015 1.3  0.9 - 3.3 10e3/uL Final  . MONO# 04/13/2015 0.8  0.1 - 0.9 10e3/uL Final  . Eosinophils Absolute 04/13/2015 0.1  0.0 - 0.5 10e3/uL Final  . Basophils Absolute 04/13/2015 0.0  0.0 - 0.1 10e3/uL Final  . NEUT% 04/13/2015 76.1  38.4 - 76.8 % Final  . LYMPH% 04/13/2015 13.8* 14.0 - 49.7 % Final  . MONO% 04/13/2015 8.3  0.0 - 14.0 % Final  . EOS% 04/13/2015 1.5  0.0 - 7.0 % Final  . BASO% 04/13/2015 0.3  0.0 - 2.0 % Final  . nRBC 04/13/2015 0  0 - 0 % Final     RADIOGRAPHIC STUDIES: No results found.  ASSESSMENT/PLAN:    Breast cancer of upper-inner quadrant of right female breast Patient is status post mastectomy, chemotherapy therapy, and radiation therapy.  She is currently undergoing observation only.  She continues to suffer with chronic, intractable right upper extremity lymphedema.  She continues to see the  lymphedema clinic on a frequent basis for treatment.  Recent restaging CT of the chest revealed no acute abnormality/findings.  The plan is for the patient to return for labs only on 04/30/2015.  Patient will return on 05/07/2015 for follow-up visit.   Post-lymphadenectomy lymphedema of arm Patient has been suffering  with chronic right upper extremity.  Intractable lymphedema for the past several years.  She has chronic severe lymphedema, mild erythema, and tenderness to entire right arm all the way up into her shoulder.  Patient has such extreme lymphedema that she has developed some skin breakdown to her right upper posterior arm.  She has been undergoing dressing changes/care and treatment per the lymphedema clinic for the past several weeks with only moderate improvement.  Patient states that she also continues to take both Keflex and doxycycline and a biotics to prevent any type of infection.  Patient denies any recent fevers or chills.  On exam.-Patient does have severe right upper extremity lymphedema.  She has obvious skin breakdown to her right posterior upper arm; with serous drainage.  There is no active infection noted on exam; but patient does continue with both antibodies as previously directed.  Had a telephone conversation with provider from the lymphedema clinic today; the decision was made for patient to obtain a wound clinic referral for more intensive wound therapy.  The lymphedema clinic.  Will provide wound care and lymphedema treatment until patient is able to be seen by the wound clinic.   Chronic pain Patient continues to complain of chronic discomfort/pain to her right upper extremity due to severe lymphedema.  Patient states that she meets with her primary care physician for refill of her Opana pain medication this coming Friday, 04/17/2015.  She reports that she is completely out of all pain medications; is requesting a small refill of the oxycodone 10 mg tablets to last  until this coming Friday.  Oxycodone 10 mg tablets #25.  Prescription was given to the patient today.   Patient stated understanding of all instructions; and was in agreement with this plan of care. The patient knows to call the clinic with any problems, questions or concerns.   Review/collaboration with Dr. Jana Hakim regarding all aspects of patient's visit today.   Total time spent with patient was 25 minutes;  with greater than 75 percent of that time spent in face to face counseling regarding patient's symptoms,  and coordination of care and follow up.  Disclaimer: This note was dictated with voice recognition software. Similar sounding words can inadvertently be transcribed and may not be corrected upon review.   Drue Second, NP 04/13/2015

## 2015-04-13 NOTE — Assessment & Plan Note (Signed)
Patient continues to complain of chronic discomfort/pain to her right upper extremity due to severe lymphedema.  Patient states that she meets with her primary care physician for refill of her Opana pain medication this coming Friday, 04/17/2015.  She reports that she is completely out of all pain medications; is requesting a small refill of the oxycodone 10 mg tablets to last until this coming Friday.  Oxycodone 10 mg tablets #25.  Prescription was given to the patient today.

## 2015-04-13 NOTE — Assessment & Plan Note (Signed)
Patient has been suffering with chronic right upper extremity.  Intractable lymphedema for the past several years.  She has chronic severe lymphedema, mild erythema, and tenderness to entire right arm all the way up into her shoulder.  Patient has such extreme lymphedema that she has developed some skin breakdown to her right upper posterior arm.  She has been undergoing dressing changes/care and treatment per the lymphedema clinic for the past several weeks with only moderate improvement.  Patient states that she also continues to take both Keflex and doxycycline and a biotics to prevent any type of infection.  Patient denies any recent fevers or chills.  On exam.-Patient does have severe right upper extremity lymphedema.  She has obvious skin breakdown to her right posterior upper arm; with serous drainage.  There is no active infection noted on exam; but patient does continue with both antibodies as previously directed.  Had a telephone conversation with provider from the lymphedema clinic today; the decision was made for patient to obtain a wound clinic referral for more intensive wound therapy.  The lymphedema clinic.  Will provide wound care and lymphedema treatment until patient is able to be seen by the wound clinic.

## 2015-04-13 NOTE — Telephone Encounter (Signed)
Labs/ov per 06/06 POF, pt is aware and seeing CB/NP.Marland KitchenMarland Kitchen KJ

## 2015-04-13 NOTE — Telephone Encounter (Signed)
I phoned [name] to question how her arm is doing since when last seen on 04/09/15, there was concerns of right proximal posterior upper arm fluid drainage.  She reported her arm looked much better after she removed her compression wrapping on 04/11/15, that her draining area appeared to be healing and that her swelling was reduced.  She reported she then got into a warm shower and it began draining again.  She reported already being on 2 antibiotics for this to prevent an infection as she is at high risk.  She reported that she has an appointment today to see [name] Lesser, NP and I encouraged her to do this and see if we should continue or stop therapy.  It appears that this area would heal faster with constant compression and I explained that to her.  [name] appeared happy that I called and stated that she would call me back after seeing [name] to let me know the outcome.

## 2015-04-13 NOTE — Therapy (Signed)
[place], [place], [address] Phone: [phone]   Fax:  [phone]  Physical Therapy Treatment  Patient Details  Name: [name] MRN: [id number] Date of Birth: [birth year] Referring Provider:  [name], MD  Encounter Date: 04/13/2015      PT End of Session - 04/13/15 1244    Visit Number 27   Number of Visits 30   Date for PT Re-Evaluation 04/24/15   PT Start Time 1202   PT Stop Time 1235   PT Time Calculation (min) 33 min   Activity Tolerance Patient tolerated treatment well   Behavior During Therapy [facility] for tasks assessed/performed      Past Medical History  Diagnosis Date  . DVT (deep venous thrombosis) 10/07/2011  . Breast cancer   . Hypertension   . Rheumatic fever     Past Surgical History  Procedure Laterality Date  . Mastectomy      bilateral  . Breast surgery    . Port a cath revision      There were no vitals filed for this visit.  Visit Diagnosis:  Postmastectomy lymphedema  Stiffness of joint, shoulder region, right      Subjective Assessment - 04/13/15 1204    Subjective Went to see the NP this morning and she said I don't have an infection in my arm but thinks I should go to the wound clinic.  She said go ahead and keep wrapping me until I can get in to that clinic.   Pertinent History Had Doppler in early april  , which was negative for the right arm.  Just was prescribed an antibiotic because of redness in right arm.in early april   Patient Stated Goals Reduce arm size   Currently in Pain? Yes   Pain Score 5    Pain Location Arm   Pain Orientation Right   Pain Descriptors / Indicators Aching   Pain Onset More than a month ago   Pain Frequency Constant   Aggravating Factors  swelling   Pain Relieving Factors compression bandaging   Multiple Pain Sites No                         [facility] Adult PT Treatment/Exercise - 04/13/15 0001     Manual Therapy   Manual Therapy Compression Bandaging   Compression Bandaging Applied Biotone lotion to right UE excluding open area on proximal posterior arm.  Applied ABD pad to right posterior upper arm covered by elastomull to hold pad in place.  Then applied thick stockinette to entire arm.  Elastomull applied to all 5 fingers and reinforced with 2nd elastomull in opposite direction.  Applied Artiflex X2 to right arm with extra padding at hand and elbow.  Applied 6 Comprilan short stretch compression bandages to right arm from hand to axilla.                  PT Education - 04/13/15 1243    Education provided Yes   Education Details Instructed pt to doff bandages if pain worsened to assess skin but otherwise to stay wrapped 100% of the time.   Person(s) Educated Patient   Methods Explanation   Comprehension Verbalized understanding           Short Term Clinic Goals - 04/07/15 1811    CC Short Term Goal  #2   Title Rt. UE circumference at 15 cm. proximal to ulnar styloid will reduce  by at least 2 cm.   Baseline 34.8 cm. at initial eval; 34.9 cm. on 02/25/15   Status On-going             [place] - 04/07/15 1811    CC Long Term Goal  #1   Title Rt. UE circumference at 15 cm. proximal to olecranon will reduce by at least 2 cm.   Status On-going   CC Long Term Goal  #2   Title Rt. UE circumference at 15 cm. proximal to ulnar styloid will reduce by at least 3 cm.   Status On-going   CC Long Term Goal  #3   Title Pt. will have been measured for new daytime compression garments.   Status Achieved   CC Long Term Goal  #4   Title Pt. will be independent in self-management of lymphedema, including manual lymph drainage.   Status On-going            Plan - 04/13/15 1246    Clinical Impression Statement Pt saw Selena Lesser, NP at the [facility] today for an assessment of her wound.  After seeing her and the PT speaking with Cyndee, it is recommended that  the pt seek treatment at the wound care center.  There is typically a wait to be seen there so we are all in agreement that we will continue compression bandaging and treatment here for lymphedema until she can be seen at the wound center.  Her arm and wound actually appeared  better today compared to last week.  There appeared to be less swelling and the wound looked less red and angry.  She will benefit from continued compression on her arm to reduce swelling and promote healing of her skin.   Pt will benefit from skilled therapeutic intervention in order to improve on the following deficits Increased edema;Decreased knowledge of precautions;Impaired UE functional use   Rehab Potential Good   Clinical Impairments Affecting Rehab Potential Open sore that needs to covered with compression   PT Frequency 3x / week   PT Duration 4 weeks   PT Treatment/Interventions Manual lymph drainage;Compression bandaging;ADLs/Self Care Home Management;Patient/family education   PT Next Visit Plan Complete decongestive therapy; remeasure   PT Home Exercise Plan pt. was encouraged to continue with remedial ROM at all joints for lymphedema while in bandages   Consulted and Agree with Plan of Care Patient        Problem List Patient Active Problem List   Diagnosis Date Noted  . Left arm pain 08/18/2014  . Breast cancer of upper-inner quadrant of right female breast 10/03/2013  . Post-lymphadenectomy lymphedema of arm 08/15/2013  . Port or reservoir infection 01/29/2013  . DVT (deep venous thrombosis) 10/07/2011  . Chest pain 09/10/2009  . Secondary cardiomyopathy 09/02/2009  . Essential hypertension 02/26/2009  . GERD 02/26/2009    [name], PT 04/13/2015, 12:52 PM  [place], [place], [address] Phone: [phone]   Fax:  [phone]

## 2015-04-13 NOTE — Assessment & Plan Note (Signed)
Patient is status post mastectomy, chemotherapy therapy, and radiation therapy.  She is currently undergoing observation only.  She continues to suffer with chronic, intractable right upper extremity lymphedema.  She continues to see the lymphedema clinic on a frequent basis for treatment.  Recent restaging CT of the chest revealed no acute abnormality/findings.  The plan is for the patient to return for labs only on 04/30/2015.  Patient will return on 05/07/2015 for follow-up visit.

## 2015-04-14 ENCOUNTER — Ambulatory Visit: Payer: Commercial Managed Care - PPO | Admitting: Physical Therapy

## 2015-04-14 ENCOUNTER — Telehealth: Payer: Self-pay

## 2015-04-14 DIAGNOSIS — I972 Postmastectomy lymphedema syndrome: Secondary | ICD-10-CM

## 2015-04-14 NOTE — Therapy (Signed)
[place], [place], [address] Phone: [phone]   Fax:  [phone]  Physical Therapy Treatment  Patient Details  Name: [name] MRN: [id number] Date of Birth: 07-19-1956 Referring Provider:  Chauncey Cruel, MD  Encounter Date: 04/14/2015      PT End of Session - 04/14/15 1713    Visit Number 28   Number of Visits 30   Date for PT Re-Evaluation 04/24/15   PT Start Time 2637   PT Stop Time 1702   PT Time Calculation (min) 57 min   Activity Tolerance Patient tolerated treatment well   Behavior During Therapy [facility] for tasks assessed/performed      Past Medical History  Diagnosis Date  . DVT (deep venous thrombosis) 10/07/2011  . Breast cancer   . Hypertension   . Rheumatic fever     Past Surgical History  Procedure Laterality Date  . Mastectomy      bilateral  . Breast surgery    . Port a cath revision      There were no vitals filed for this visit.  Visit Diagnosis:  Postmastectomy lymphedema      Subjective Assessment - 04/14/15 1607    Subjective Hasn't heard from the wound center yet.  Cyndee called me today and asked me how it was going.     Currently in Pain? No/denies                         [facility] Adult PT Treatment/Exercise - 04/14/15 0001    Manual Therapy   Edema Management Removed bandages (patient had done some of this) including wound bandage, which had a moderate amount of drainage on it.  Washed arm while inspecting skin.  No new openings in skin.   Manual Lymphatic Drainage (MLD) In supine:  short neck, left axilla and anterior interaxillary anastomosis, right groin and axillo-inguinal anastomosis, and right UE from fingers to shoulder.  In left sidelying, posterior interaxillary anastomosis from right to left.   Compression Bandaging Applied Biotone lotion to right UE excluding open area on proximal posterior arm.  Applied ABD pad to right  posterior upper arm covered by elastomull to hold pad in place.  Then applied thick stockinette to entire arm.  Elastomull applied to all 5 fingers. Only one Elastomull per patient's request.  Applied Artiflex X2 to right arm with extra padding at hand and elbow.  Applied 6 Comprilan short stretch compression bandages to right arm from hand to axilla.                  PT Education - 04/13/15 1243    Education provided Yes   Education Details Instructed pt to doff bandages if pain worsened to assess skin but otherwise to stay wrapped 100% of the time.   Person(s) Educated Patient   Methods Explanation   Comprehension Verbalized understanding           Short Term Clinic Goals - 04/07/15 1811    CC Short Term Goal  #2   Title Rt. UE circumference at 15 cm. proximal to ulnar styloid will reduce by at least 2 cm.   Baseline 34.8 cm. at initial eval; 34.9 cm. on 02/25/15   Status On-going             [place] - 04/07/15 1811    CC Long Term Goal  #1   Title Rt. UE circumference at 15  cm. proximal to olecranon will reduce by at least 2 cm.   Status On-going   CC Long Term Goal  #2   Title Rt. UE circumference at 15 cm. proximal to ulnar styloid will reduce by at least 3 cm.   Status On-going   CC Long Term Goal  #3   Title Pt. will have been measured for new daytime compression garments.   Status Achieved   CC Long Term Goal  #4   Title Pt. will be independent in self-management of lymphedema, including manual lymph drainage.   Status On-going            Plan - 04/14/15 1713    Clinical Impression Statement Patient hasn't heard from the wound center yet, but is on board to continue bandaging until she is seen by them.  Wound had oozed a moderate amount of fluid since yesterday; patient felt the arm was a little softer, though therapist could not detect any softening.     Pt will benefit from skilled therapeutic intervention in order to improve on the  following deficits Decreased skin integrity;Increased edema;Impaired UE functional use   Rehab Potential Good   Clinical Impairments Affecting Rehab Potential Open sore that needs to covered with compression   PT Frequency 4x / week   PT Treatment/Interventions Manual lymph drainage;Compression bandaging;ADLs/Self Care Home Management;Patient/family education   PT Next Visit Plan Complete decongestive therapy; remeasure   Consulted and Agree with Plan of Care Patient        Problem List Patient Active Problem List   Diagnosis Date Noted  . Chronic pain 04/13/2015  . Left arm pain 08/18/2014  . Breast cancer of upper-inner quadrant of right female breast 10/03/2013  . Post-lymphadenectomy lymphedema of arm 08/15/2013  . Port or reservoir infection 01/29/2013  . DVT (deep venous thrombosis) 10/07/2011  . Chest pain 09/10/2009  . Secondary cardiomyopathy 09/02/2009  . Essential hypertension 02/26/2009  . GERD 02/26/2009    SALISBURY,DONNA 04/14/2015, 5:17 PM  [place], [place], [address] Phone: [phone]   Fax:  [place], PT 04/14/2015 5:17 PM

## 2015-04-14 NOTE — Telephone Encounter (Signed)
Called to follow up with pt after [facility] visit yesterday 04/13/15. Pt states she doing pretty good. The pain medication is helping and she got her lymph tx yesterday and going again today at 4pm. Pt denies any further questions or concerns at this time. Informed her to call with any other problems.

## 2015-04-15 ENCOUNTER — Ambulatory Visit: Payer: Commercial Managed Care - PPO | Admitting: Physical Therapy

## 2015-04-15 ENCOUNTER — Other Ambulatory Visit: Payer: Self-pay

## 2015-04-15 DIAGNOSIS — G8929 Other chronic pain: Secondary | ICD-10-CM

## 2015-04-15 DIAGNOSIS — L989 Disorder of the skin and subcutaneous tissue, unspecified: Secondary | ICD-10-CM

## 2015-04-15 DIAGNOSIS — I972 Postmastectomy lymphedema syndrome: Secondary | ICD-10-CM

## 2015-04-15 NOTE — Therapy (Signed)
[place], [place], [address] Phone: [phone]   Fax:  [phone]  Physical Therapy Treatment  Patient Details  Name: [name] MRN: [id number] Date of Birth: Jul 08, 1956 Referring Provider:  Chauncey Cruel, MD  Encounter Date: 04/15/2015      PT End of Session - 04/15/15 2127    Visit Number 29   Number of Visits 30   Date for PT Re-Evaluation 04/24/15   PT Start Time 9371   PT Stop Time 1528   PT Time Calculation (min) 47 min   Activity Tolerance Patient tolerated treatment well   Behavior During Therapy [facility] for tasks assessed/performed      Past Medical History  Diagnosis Date  . DVT (deep venous thrombosis) 10/07/2011  . Breast cancer   . Hypertension   . Rheumatic fever     Past Surgical History  Procedure Laterality Date  . Mastectomy      bilateral  . Breast surgery    . Port a cath revision      There were no vitals filed for this visit.  Visit Diagnosis:  Postmastectomy lymphedema  Skin lesion of right upper extremity      Subjective Assessment - 04/15/15 1442    Subjective (p) They're not sending me to the wound center.  They said it's not bad enough.   Currently in Pain? (p) No/denies                         [facility] Adult PT Treatment/Exercise - 04/15/15 0001    Manual Therapy   Edema Management Removed bandages (patient had done some of this) including wound bandage, which had a moderate amount of drainage on it.  Washed arm while inspecting skin.  Some small openings in skin distal to the main wound.    Manual Lymphatic Drainage (MLD) In supine:  short neck, left axilla and anterior interaxillary anastomosis, right groin and axillo-inguinal anastomosis, and right UE from fingers to shoulder.    Compression Bandaging Applied Biotone lotion to right UE excluding open area on proximal posterior arm.  Applied ABD pad to right posterior upper arm  covered by elastomull to hold pad in place.  Then applied thick stockinette to entire arm.  Elastomull applied to all 5 fingers. Only one Elastomull per patient's request.  Applied Artiflex X2 to right arm with extra padding at hand and elbow.  Applied 6 Comprilan short stretch compression bandages to right arm from hand to axilla.                     Short Term Clinic Goals - 04/07/15 1811    CC Short Term Goal  #2   Title Rt. UE circumference at 15 cm. proximal to ulnar styloid will reduce by at least 2 cm.   Baseline 34.8 cm. at initial eval; 34.9 cm. on 02/25/15   Status On-going             [place] - 04/07/15 1811    CC Long Term Goal  #1   Title Rt. UE circumference at 15 cm. proximal to olecranon will reduce by at least 2 cm.   Status On-going   CC Long Term Goal  #2   Title Rt. UE circumference at 15 cm. proximal to ulnar styloid will reduce by at least 3 cm.   Status On-going   CC Long Term Goal  #3  Title Pt. will have been measured for new daytime compression garments.   Status Achieved   CC Long Term Goal  #4   Title Pt. will be independent in self-management of lymphedema, including manual lymph drainage.   Status On-going            Plan - 04/15/15 2127    Clinical Impression Statement Patient with right proximal posterior upper extremity wound that continues to weep fluid into bandages that have been applied.  Patient felt her arm looked smaller today, but measurements were not retaken to confirm this.  She now says she is not being referred to the wound center, but that she has been told to continue to come here for wound management.   Pt will benefit from skilled therapeutic intervention in order to improve on the following deficits Decreased skin integrity;Increased edema;Impaired UE functional use   Rehab Potential Good   Clinical Impairments Affecting Rehab Potential Open sore that needs to covered with compression   PT Frequency  4x / week   PT Duration 4 weeks   PT Treatment/Interventions Manual lymph drainage;Compression bandaging   PT Next Visit Plan Remeasure; will need renewal of therapy with frequncy at 3-5x/week to address wound care and complete decongestive therapy; redress right UE wound; continue complete decongestive therapy   Recommended Other Services Patient is waiting for Rexford Maus to exchange garments that are too short.   Consulted and Agree with Plan of Care Patient        Problem List Patient Active Problem List   Diagnosis Date Noted  . Chronic pain 04/13/2015  . Left arm pain 08/18/2014  . Breast cancer of upper-inner quadrant of right female breast 10/03/2013  . Post-lymphadenectomy lymphedema of arm 08/15/2013  . Port or reservoir infection 01/29/2013  . DVT (deep venous thrombosis) 10/07/2011  . Chest pain 09/10/2009  . Secondary cardiomyopathy 09/02/2009  . Essential hypertension 02/26/2009  . GERD 02/26/2009    [name] 04/15/2015, 9:33 PM  [place], [place], [address] Phone: [phone]   Fax:  [place], PT 04/15/2015 9:34 PM

## 2015-04-16 ENCOUNTER — Ambulatory Visit: Payer: Commercial Managed Care - PPO | Admitting: Physical Therapy

## 2015-04-16 DIAGNOSIS — I972 Postmastectomy lymphedema syndrome: Secondary | ICD-10-CM

## 2015-04-16 DIAGNOSIS — L989 Disorder of the skin and subcutaneous tissue, unspecified: Secondary | ICD-10-CM

## 2015-04-16 DIAGNOSIS — M25611 Stiffness of right shoulder, not elsewhere classified: Secondary | ICD-10-CM

## 2015-04-16 NOTE — Addendum Note (Signed)
Addended by: Arbutus Ped C on: 04/16/2015 08:08 AM   Modules accepted: Orders

## 2015-04-16 NOTE — Therapy (Signed)
[place], [place], [address] Phone: [phone]   Fax:  [phone]  Physical Therapy Treatment  Patient Details  Name: [name] MRN: [id number] Date of Birth: 1956-10-04 Referring Provider:  Chauncey Cruel, MD  Encounter Date: 04/16/2015      PT End of Session - 04/16/15 1452    Visit Number 30   Number of Visits 30  needs to be upgraded   Date for PT Re-Evaluation 06/06/15   PT Start Time 1350   PT Stop Time 1440   PT Time Calculation (min) 50 min      Past Medical History  Diagnosis Date  . DVT (deep venous thrombosis) 10/07/2011  . Breast cancer   . Hypertension   . Rheumatic fever     Past Surgical History  Procedure Laterality Date  . Mastectomy      bilateral  . Breast surgery    . Port a cath revision      There were no vitals filed for this visit.  Visit Diagnosis:  Postmastectomy lymphedema  Skin lesion of right upper extremity  Stiffness of joint, shoulder region, right      Subjective Assessment - 04/16/15 1452    Subjective "I"ve been working on my range of motion,  The flexitouch didn't work for me."  She states she thinks her arm is doing better.  She is happy that she can use her right fingers on the computer mouse nowl   Pain Onset More than a month ago                         [facility] Adult PT Treatment/Exercise - 04/16/15 0001    Manual Therapy   Edema Management Removed bandages  including wound bandage using saline in places where bandage was adhering to wound.  moderate amount of drainage on old dressing with red areas of granulation present along with areas of hypertrophic spongy skin. cleansed wound with blotting, pt washed hand and lower arm at sink.   Manual Lymphatic Drainage (MLD) In supine:  short neck, left axilla and anterior interaxillary anastomosis, right groin and axillo-inguinal anastomosis, and right UE from fingers to  shoulder.    Compression Bandaging Applied Biotone lotion to right UE excluding open area on proximal posterior arm.  Applied ABD pad to right posterior upper arm covered by elastomull to hold pad in place.  Then applied thick stockinette to entire arm.  Elastomull applied to all 5 fingers. Only one Elastomull per patient's request.  Applied Artiflex X2 to right arm with extra padding at hand and elbow.  Applied 6 Comprilan short stretch compression bandages to right arm from hand to axilla.                     Short Term Clinic Goals - 04/16/15 1458    CC Short Term Goal  #1   Title Rt. UE circumference at 15 cm. proximal to olecranon will reduce by at least 1.5 cm.   Baseline 42.6 at initial eval; 40.7 cm. 02/25/15   Status Achieved   CC Short Term Goal  #2   Title Rt. UE circumference at 15 cm. proximal to ulnar styloid will reduce by at least 2 cm.   Baseline 34.8 cm. at initial eval; 34.9 cm. on 02/25/15   Status On-going             [facility] Goals - 04/16/15 1459  CC Long Term Goal  #1   Title Rt. UE circumference at 15 cm. proximal to olecranon will reduce by at least 2 cm.   Baseline 42.6   Status On-going   CC Long Term Goal  #2   Title Rt. UE circumference at 15 cm. proximal to ulnar styloid will reduce by at least 3 cm.   Baseline 34.8   Status On-going   CC Long Term Goal  #3   Title Pt. will have been measured for new daytime compression garments.   Status Achieved   CC Long Term Goal  #4   Title Pt. will be independent in self-management of lymphedema, including manual lymph drainage.   Status On-going            Plan - 2015-04-21 1455    Clinical Impression Statement pt states her arm is better since she got new bandages. Wound appears to be granulating, but is still large. overall, arem appears to be improving G code done   Clinical Impairments Affecting Rehab Potential Open sore that needs to covered with compression   PT Next Visit  Plan Remeasure; continue with manual lymph drainage and compression wrapping.           G-Codes - April 21, 2015 1459    Functional Assessment Tool Used Clinical Judgement   Functional Limitation Carrying, moving and handling objects   Carrying, Moving and Handling Objects Current Status [phone]) At least 20 percent but less than 40 percent impaired, limited or restricted   Carrying, Moving and Handling Objects Goal Status ([id number]) At least 1 percent but less than 20 percent impaired, limited or restricted      Problem List Patient Active Problem List   Diagnosis Date Noted  . Chronic pain 04/13/2015  . Left arm pain 08/18/2014  . Breast cancer of upper-inner quadrant of right female breast 10/03/2013  . Post-lymphadenectomy lymphedema of arm 08/15/2013  . Port or reservoir infection 01/29/2013  . DVT (deep venous thrombosis) 10/07/2011  . Chest pain 09/10/2009  . Secondary cardiomyopathy 09/02/2009  . Essential hypertension 02/26/2009  . GERD 02/26/2009   Donato Heinz. Owens Shark, PT   04/21/15, 3:02 PM  [place], [place], [address] Phone: [phone]   Fax:  [phone]

## 2015-04-17 ENCOUNTER — Ambulatory Visit: Payer: Commercial Managed Care - PPO | Admitting: Physical Therapy

## 2015-04-17 DIAGNOSIS — M25611 Stiffness of right shoulder, not elsewhere classified: Secondary | ICD-10-CM

## 2015-04-17 DIAGNOSIS — I972 Postmastectomy lymphedema syndrome: Secondary | ICD-10-CM

## 2015-04-17 DIAGNOSIS — L989 Disorder of the skin and subcutaneous tissue, unspecified: Secondary | ICD-10-CM

## 2015-04-17 NOTE — Therapy (Signed)
[place], [place], [address] Phone: [phone]   Fax:  [phone]  Physical Therapy Treatment  Patient Details  Name: [name] MRN: [id number] Date of Birth: Aug 18, 1956 Referring Provider:  Chauncey Cruel, MD  Encounter Date: 04/17/2015      PT End of Session - 04/17/15 1135    Visit Number 31   Number of Visits 67  allowing for 5x /weel for 4 weeks   Date for PT Re-Evaluation 06/06/15   PT Start Time 1020   PT Stop Time 1110   PT Time Calculation (min) 50 min      Past Medical History  Diagnosis Date  . DVT (deep venous thrombosis) 10/07/2011  . Breast cancer   . Hypertension   . Rheumatic fever     Past Surgical History  Procedure Laterality Date  . Mastectomy      bilateral  . Breast surgery    . Port a cath revision      There were no vitals filed for this visit.  Visit Diagnosis:  Postmastectomy lymphedema  Skin lesion of right upper extremity  Stiffness of joint, shoulder region, right      Subjective Assessment - 04/17/15 1026    Subjective "not much irritation"    Pertinent History Had Doppler in early april  , which was negative for the right arm.  Just was prescribed an antibiotic because of redness in right arm.in early april   Currently in Pain? No/denies                         [facility] Adult PT Treatment/Exercise - 04/17/15 0001    Manual Therapy   Edema Management Removed bandages  including wound bandage using saline in places where bandage was adhering to wound.  moderate amount of drainage on old dressing with red areas of granulation present along with areas of hypertrophic spongy skin. cleansed wound with blotting, pt washed hand and lower arm at sink.   Manual Lymphatic Drainage (MLD)  right UE from fingers to shoulder.    Compression Bandaging Applied Biotone lotion to right UE excluding open area on proximal posterior arm.  Applied  ABD pad to right posterior upper arm covered by elastomull to hold pad in place.  Then applied thick stockinette to entire arm.2  Elastomull applied to all 5 fingers. Only one Elastomull per patient's request.  Applied Artiflex X2 to right arm with extra padding at hand and elbow.  Applied 6 Comprilan short stretch compression bandages to right arm from hand to axilla.                     Short Term Clinic Goals - 04/16/15 1458    CC Short Term Goal  #1   Title Rt. UE circumference at 15 cm. proximal to olecranon will reduce by at least 1.5 cm.   Baseline 42.6 at initial eval; 40.7 cm. 02/25/15   Status Achieved   CC Short Term Goal  #2   Title Rt. UE circumference at 15 cm. proximal to ulnar styloid will reduce by at least 2 cm.   Baseline 34.8 cm. at initial eval; 34.9 cm. on 02/25/15   Status On-going             [place] - 04/16/15 1459    CC Long Term Goal  #1   Title Rt. UE circumference at 15 cm. proximal to olecranon  will reduce by at least 2 cm.   Baseline 42.6   Status On-going   CC Long Term Goal  #2   Title Rt. UE circumference at 15 cm. proximal to ulnar styloid will reduce by at least 3 cm.   Baseline 34.8   Status On-going   CC Long Term Goal  #3   Title Pt. will have been measured for new daytime compression garments.   Status Achieved   CC Long Term Goal  #4   Title Pt. will be independent in self-management of lymphedema, including manual lymph drainage.   Status On-going            Plan - 04/17/15 1136    Clinical Impression Statement pt continues with moderate drainage with mild odor on old dressing. Red wound base. Pt states she does not have an appointment with the wound care center?? although she may benefit from their imput on other dressing change options.  Continue with ABD for now to absorb drainage.   recommend she continue with 5x/week treatment here for close monitoring.    Pt will benefit from skilled therapeutic  intervention in order to improve on the following deficits Decreased skin integrity;Increased edema;Impaired UE functional use   Rehab Potential Good   Clinical Impairments Affecting Rehab Potential Open sore that needs to covered with compression   PT Next Visit Plan Remeasure arm  Meaure outside dimensions of wound on upper arm ; continue with manual lymph drainage and compression wrapping.           G-Codes - 04-29-2015 1459    Functional Assessment Tool Used Clinical Judgement   Functional Limitation Carrying, moving and handling objects   Carrying, Moving and Handling Objects Current Status [phone]) At least 20 percent but less than 40 percent impaired, limited or restricted   Carrying, Moving and Handling Objects Goal Status ([id number]) At least 1 percent but less than 20 percent impaired, limited or restricted      Problem List Patient Active Problem List   Diagnosis Date Noted  . Chronic pain 04/13/2015  . Left arm pain 08/18/2014  . Breast cancer of upper-inner quadrant of right female breast 10/03/2013  . Post-lymphadenectomy lymphedema of arm 08/15/2013  . Port or reservoir infection 01/29/2013  . DVT (deep venous thrombosis) 10/07/2011  . Chest pain 09/10/2009  . Secondary cardiomyopathy 09/02/2009  . Essential hypertension 02/26/2009  . GERD 02/26/2009   Donato Heinz. Owens Shark, PT  04/17/2015, 11:43 AM  [place], [place], [address] Phone: [phone]   Fax:  [phone]

## 2015-04-19 ENCOUNTER — Emergency Department (HOSPITAL_COMMUNITY)
Admission: EM | Admit: 2015-04-19 | Discharge: 2015-04-19 | Disposition: A | Discharge status: Home / Self Care | Payer: Commercial Managed Care - PPO | Attending: Emergency Medicine | Admitting: Emergency Medicine

## 2015-04-19 ENCOUNTER — Encounter (HOSPITAL_COMMUNITY): Payer: Self-pay

## 2015-04-19 DIAGNOSIS — Z720 Tobacco use: Secondary | ICD-10-CM | POA: Diagnosis not present

## 2015-04-19 DIAGNOSIS — Z853 Personal history of malignant neoplasm of breast: Secondary | ICD-10-CM | POA: Insufficient documentation

## 2015-04-19 DIAGNOSIS — L03113 Cellulitis of right upper limb: Secondary | ICD-10-CM | POA: Insufficient documentation

## 2015-04-19 DIAGNOSIS — I10 Essential (primary) hypertension: Secondary | ICD-10-CM | POA: Diagnosis not present

## 2015-04-19 DIAGNOSIS — Z79899 Other long term (current) drug therapy: Secondary | ICD-10-CM | POA: Diagnosis not present

## 2015-04-19 DIAGNOSIS — Z86718 Personal history of other venous thrombosis and embolism: Secondary | ICD-10-CM | POA: Insufficient documentation

## 2015-04-19 DIAGNOSIS — I890 Lymphedema, not elsewhere classified: Secondary | ICD-10-CM | POA: Insufficient documentation

## 2015-04-19 DIAGNOSIS — Z4801 Encounter for change or removal of surgical wound dressing: Secondary | ICD-10-CM | POA: Diagnosis present

## 2015-04-19 MED ORDER — DOXYCYCLINE HYCLATE 100 MG PO TABS
100.0000 mg | ORAL_TABLET | Freq: Once | ORAL | Status: AC
  Administered 2015-04-19: 100 mg via ORAL
  Filled 2015-04-19: qty 1

## 2015-04-19 MED ORDER — DOXYCYCLINE HYCLATE 100 MG PO CAPS: 100.0000 mg | ORAL_CAPSULE | Freq: Two times a day (BID) | ORAL | Status: DC

## 2015-04-19 NOTE — ED Notes (Signed)
Pt has wound to posterior RUA with pustulent d/c and foul odor. Wound bed pink/red with scattered bloody drainage noted. Pt reported being recently treated ABT and plans to be seen via wound clinic on 05/19/2015. Pt has lymphoedema to rt arm.

## 2015-04-19 NOTE — ED Notes (Signed)
Cleanse wound with saline and reapply dry dsg.

## 2015-04-19 NOTE — ED Notes (Signed)
Awake. Verbally responsive. A/O x4. Resp even and unlabored. No audible adventitious breath sounds noted. ABC's intact.  

## 2015-04-19 NOTE — Discharge Instructions (Signed)
Follow up in wound care clinic. Take doxycycline for one week.  If you were given medicines take as directed.  If you are on coumadin or contraceptives realize their levels and effectiveness is altered by many different medicines.  If you have any reaction (rash, tongues swelling, other) to the medicines stop taking and see a physician.    If your blood pressure was elevated in the ER make sure you follow up for management with a primary doctor or return for chest pain, shortness of breath or stroke symptoms.  Please follow up as directed and return to the ER or see a physician for new or worsening symptoms.  Thank you. Filed Vitals:   04/19/15 1521 04/19/15 1747  BP: 118/72   Pulse: 70   Temp: 98.1 F (36.7 C)   TempSrc: Oral   Resp: 16   Height: 5\' 4"  (1.626 m)   Weight: 167 lb 2 oz (75.807 kg)   SpO2: 100% 99%

## 2015-04-19 NOTE — ED Provider Notes (Signed)
CSN: [id number]     Arrival date & time 04/19/15  1456 History   First MD Initiated Contact with Patient 04/19/15 1734     Chief Complaint  Patient presents with  . Wound Check     (Consider location/radiation/quality/duration/timing/severity/associated sxs/prior Treatment) HPI Comments: 59 year old female with high blood pressure, cardiopathy, breast cancer with bilateral mastectomy, chronic lipedema for 5 years in the right arm presents with persistent right arm swelling however primary reason for visit is increased drainage and worsening wound. Located right upper arm inner aspect. Mild drainage with odor. Patient currently on Keflex. Worsening the past few days. Patient has follow-up outpatient and is waiting to get back into wound care. No shortness of breath or chest pain.  Patient is a 59 y.o. female presenting with wound check. The history is provided by the patient.  Wound Check Pertinent negatives include no chest pain, no abdominal pain, no headaches and no shortness of breath.    Past Medical History  Diagnosis Date  . DVT (deep venous thrombosis) 10/07/2011  . Breast cancer   . Hypertension   . Rheumatic fever    Past Surgical History  Procedure Laterality Date  . Mastectomy      bilateral  . Breast surgery    . Port a cath revision     Family History  Problem Relation Age of Onset  . Pancreatic cancer Mother   . Kidney cancer Sister 50  . Breast cancer Sister 51  . Breast cancer Other 33   History  Substance Use Topics  . Smoking status: Light Tobacco Smoker  . Smokeless tobacco: Never Used  . Alcohol Use: Yes     Comment: Rarely   OB History    No data available     Review of Systems  Constitutional: Negative for fever and chills.  HENT: Negative for congestion.   Eyes: Negative for visual disturbance.  Respiratory: Negative for shortness of breath.   Cardiovascular: Negative for chest pain.  Gastrointestinal: Negative for vomiting and abdominal  pain.  Genitourinary: Negative for dysuria and flank pain.  Musculoskeletal: Negative for back pain, neck pain and neck stiffness.  Skin: Positive for rash and wound.  Neurological: Negative for light-headedness and headaches.      Allergies  Tramadol and Hydrocodone-acetaminophen  Home Medications   Prior to Admission medications   Medication Sig Start Date End Date Taking? Authorizing Provider  alprazolam Duanne Moron) 2 MG tablet Take 2 mg by mouth daily.  03/15/13  Yes Historical Provider, MD  Ascorbic Acid (VITAMIN C) 100 MG tablet Take 100 mg by mouth daily.     Yes Historical Provider, MD  calcium-vitamin D (OSCAL WITH D) 500-200 MG-UNIT per tablet Take 1 tablet by mouth daily.     Yes Historical Provider, MD  doxycycline (VIBRA-TABS) 100 MG tablet Take 1 tablet (100 mg total) by mouth daily. 03/16/15  Yes Chauncey Cruel, MD  gabapentin (NEURONTIN) 300 MG capsule Take 1 capsule (300 mg total) by mouth at bedtime. 03/20/15  Yes Chauncey Cruel, MD  Multiple Vitamin (MULTIVITAMIN) capsule Take 1 capsule by mouth daily.     Yes Historical Provider, MD  Oxycodone HCl 10 MG TABS 1 tab PO Q 6 hours PRN pain.  DO NOT TAKE ANY OTHER PAIN MEDS WHEN TAKING. 04/13/15  Yes Susanne Borders, NP  oxymorphone (OPANA) 10 MG tablet Take 1 tablet by mouth 2 (two) times daily as needed for pain.  01/14/15  Yes Historical Provider, MD  triamterene-hydrochlorothiazide Jacklynn Lewis)  37.5-25 MG per capsule TAKE 1 EACH (1 CAPSULE TOTAL) BY MOUTH EVERY MORNING. 04/09/15  Yes Chauncey Cruel, MD  cephALEXin (KEFLEX) 500 MG capsule Take 1 capsule (500 mg total) by mouth 2 (two) times daily. Patient not taking: Reported on 04/19/2015 04/03/15   Chauncey Cruel, MD  doxycycline (VIBRAMYCIN) 100 MG capsule Take 1 capsule (100 mg total) by mouth 2 (two) times daily. One po bid x 7 days 04/19/15   Elnora Morrison, MD   BP 130/75 mmHg  Pulse 77  Temp(Src) 98.7 F (37.1 C) (Oral)  Resp 18  Ht 5\' 4"  (1.626 m)  Wt 167 lb 2 oz  (75.807 kg)  BMI 28.67 kg/m2  SpO2 98% Physical Exam  Constitutional: She is oriented to person, place, and time. She appears well-developed and well-nourished.  HENT:  Head: Normocephalic and atraumatic.  Eyes: Conjunctivae are normal. Right eye exhibits no discharge. Left eye exhibits no discharge.  Neck: Normal range of motion. Neck supple. No tracheal deviation present.  Cardiovascular: Normal rate.   Pulmonary/Chest: Effort normal.  Abdominal: Soft. She exhibits no distension. There is no tenderness. There is no guarding.  Musculoskeletal: She exhibits edema and tenderness.  Neurological: She is alert and oriented to person, place, and time.  Skin: Skin is warm. Rash noted.  Patient has 3+ swelling in the right upper extremity patient says this is baseline for her. Patient has superficial mostly clear mild yellow drainage from her wound upper inner aspect of the right arm. Mild warmth, mild erythema. No crepitus.  Psychiatric: She has a normal mood and affect.  Nursing note and vitals reviewed.   ED Course  Procedures (including critical care time) Labs Review Labs Reviewed - No data to display  Imaging Review No results found.   EKG Interpretation None      MDM   Final diagnoses:  Right arm cellulitis  Lymphedema   Patient presents with worsening wound, chronic swelling, patient says she has been evaluated for blood clot and has follow-up outpatient for this. Plan for doxycycline 1 in addition to her home Keflex and close follow-up for recheck and wound care. Attempted to consult social work and case management, not available at this time. Patient given wound care information for follow-up. Strict reasons to return given.  Results and differential diagnosis were discussed with the patient/parent/guardian. Close follow up outpatient was discussed, comfortable with the plan.   Medications  doxycycline (VIBRA-TABS) tablet 100 mg (not administered)    Filed Vitals:    04/19/15 1521 04/19/15 1747 04/19/15 1921  BP: 118/72  130/75  Pulse: 70  77  Temp: 98.1 F (36.7 C)  98.7 F (37.1 C)  TempSrc: Oral  Oral  Resp: 16  18  Height: 5\' 4"  (1.626 m)    Weight: 167 lb 2 oz (75.807 kg)    SpO2: 100% 99% 98%    Final diagnoses:  Right arm cellulitis  Lymphedema       Elnora Morrison, MD 04/19/15 1928

## 2015-04-19 NOTE — ED Notes (Signed)
Patient states she has an open wound right upper wound with foul smelling blood-tinged drainage. Patient states she has had a low grade temp 99.8 orally.

## 2015-04-20 ENCOUNTER — Telehealth: Payer: Self-pay | Admitting: *Deleted

## 2015-04-20 ENCOUNTER — Ambulatory Visit: Payer: Commercial Managed Care - PPO | Admitting: Physical Therapy

## 2015-04-20 ENCOUNTER — Ambulatory Visit (HOSPITAL_BASED_OUTPATIENT_CLINIC_OR_DEPARTMENT_OTHER): Payer: Commercial Managed Care - PPO | Admitting: Oncology

## 2015-04-20 ENCOUNTER — Other Ambulatory Visit: Payer: Self-pay | Admitting: *Deleted

## 2015-04-20 VITALS — BP 122/87 | HR 84 | Temp 97.9°F | Resp 18 | Ht 64.0 in | Wt 168.0 lb

## 2015-04-20 DIAGNOSIS — C7931 Secondary malignant neoplasm of brain: Secondary | ICD-10-CM | POA: Diagnosis not present

## 2015-04-20 DIAGNOSIS — C787 Secondary malignant neoplasm of liver and intrahepatic bile duct: Secondary | ICD-10-CM

## 2015-04-20 DIAGNOSIS — C792 Secondary malignant neoplasm of skin: Secondary | ICD-10-CM

## 2015-04-20 DIAGNOSIS — C7951 Secondary malignant neoplasm of bone: Secondary | ICD-10-CM

## 2015-04-20 DIAGNOSIS — C50911 Malignant neoplasm of unspecified site of right female breast: Secondary | ICD-10-CM

## 2015-04-20 NOTE — Telephone Encounter (Signed)
This RN contacted Database administrator for [facility] per need for daily dressing changes.  Cyril Mourning states " we can teach the patient or a care giver on how to change a dressing as long as the patient is home bound "  Order faxed and Cyril Mourning will follow up in am and contact MD nurse of above.

## 2015-04-20 NOTE — Progress Notes (Signed)
[name]  [phone] [phone]  OFFICE PROGRESS NOTE   ID: [name]   DOB: 01/08/1956  MR#: [id number]  WFU#:[id number]   PCP: [name], MD [name], [name], [name] RAD KGU:RKYHCWC [name], [name] OTHER:  [name], MD  CHIEF COMPLAINT:  Stage IV Breast Cancer  CURRENT TREATMENT: Observation   BREAST CANCER HISTORY: From the original intake note:  The patient originally presented in May 2003 when she noticed a lump in the upper inner quadrant of her right breast in September 2003.  She sought attention and had a mammogram which showed an obvious carcinoma in the upper inner quadrant of the right breast, approximately 2 cm.  There were some enlarged lymph nodes in the axilla and an FNA done showed those consistent with malignant cells, most likely an invasive ductal carcinoma.  At that point she was unsure of what to do and was referred by Dr. [name] for a discussion of her treatment options.  By biopsy, it was ER/PR negative and HER-2 was 3+.  DNA index was 1.42.  We reiterated that it was most important to have her disease surgically addressed at that point and had recommended lumpectomy and axillary nodes to be addressed with which she followed through and on 04-03-02, Dr. [name] performed a right partial mastectomy and a right axillary lymph node dissection.  Final pathology revealed a 2.4 cm. high grade, Grade III invasive ductal carcinoma with an adjacent .8 cm. also high grade invasive ductal carcinoma which was felt to represent an intramammary metastasis rather than a second primary.  A smaller mass was just medial to the larger mass.  The smaller mass was associated with high grade DCIS component.  There was no definite lymphovascular invasion identified.  However, one of fourteen axillary lymph nodes did contain a 1.5 cm. metastatic deposit.  Postoperatively she did very well.  We reiterated the fact that she  did need adjuvant chemotherapy, however, she refused and decided that she would pursue radiation.  She received radiation and completed that on 08-14-02. Her subsequent history is as detailed below.   INTERVAL HISTORY: [name] returns today for followup of her breast cancer accompanied by a friend. She went to the emergency room yesterday of the same problems so we had her come in and be worked in. complaining of worsening problems in her right upper extremity. They felt it was infectious and they started her on doxycycline. She called today complaining  REVIEW OF SYSTEMS: [name] has been receiving frequent treatments through physical therapy, with massage, wrapping, and a sleeve. Despite that, while the actual swelling of the upper extremity has improved, the skin has looked worse and there are areas of skin breakdown. The physical therapist feel it is not safe for her to continue with routine physical therapy at this time. Aside from this issue [name] feels "great" she specifically denies headaches, visual changes, nausea, vomiting, cough, phlegm production, pleurisy, shortness of breath, or change in bowel or bladder habits. She denies pain, fever, or itching.a detailed review of systems today was otherwise negative  PAST MEDICAL HISTORY: Past Medical History  Diagnosis Date  . DVT (deep venous thrombosis) 10/07/2011  . Breast cancer   . Hypertension   . Rheumatic fever     PAST SURGICAL HISTORY: Past Surgical History  Procedure Laterality Date  . Mastectomy      bilateral  . Breast surgery    . Port a cath revision  FAMILY HISTORY Family History  Problem Relation Age of Onset  . Pancreatic cancer Mother   . Kidney cancer Sister 57  . Breast cancer Sister 48  . Breast cancer Other 87  The patient's father died in his 18H  from complications of alcohol abuse. The patient's mother died in her 81s from pancreatic cancer. The patient has 6 brothers, 2 sisters. Both her  sisters have had breast cancer, both diagnosed after the age of 59. The patient has not had genetic testing so far.   GYNECOLOGIC HISTORY: Menarche age 24; first live birth age 76. She is GXP4. She is not sure when she stopped having periods. She never used hormone replacement.   SOCIAL HISTORY: The patient has worked in the past as a Psychologist, counselling. At home in addition to the patient are her husband [name], originally from [place], who works as a Administrator.  Also living in the home is her son [name] and her daughter [name] (she is studying to be a Marine scientist).  The other 2 children are [name], who has a college degree in psychology and works as a Probation officer; and [name], who lives in [place] and works as a Higher education careers adviser.   ADVANCED DIRECTIVES: Not in place  HEALTH MAINTENANCE: History  Substance Use Topics  . Smoking status: Light Tobacco Smoker  . Smokeless tobacco: Never Used  . Alcohol Use: Yes     Comment: Rarely     Colonoscopy:  Not on file  PAP:  Not on file  Bone density:  Not on file  Lipid panel:  Not on file    Allergies  Allergen Reactions  . Tramadol Nausea Only  . Hydrocodone-Acetaminophen Itching and Rash    Current Outpatient Prescriptions  Medication Sig Dispense Refill  . alprazolam (XANAX) 2 MG tablet Take 2 mg by mouth daily.     . Ascorbic Acid (VITAMIN C) 100 MG tablet Take 100 mg by mouth daily.      . calcium-vitamin D (OSCAL WITH D) 500-200 MG-UNIT per tablet Take 1 tablet by mouth daily.      . cephALEXin (KEFLEX) 500 MG capsule Take 1 capsule (500 mg total) by mouth 2 (two) times daily. (Patient not taking: Reported on 04/19/2015) 20 capsule 0  . doxycycline (VIBRA-TABS) 100 MG tablet Take 1 tablet (100 mg total) by mouth daily. 30 tablet 4  . doxycycline (VIBRAMYCIN) 100 MG capsule Take 1 capsule (100 mg total) by mouth 2 (two) times daily. One po bid x 7 days 14 capsule 0  . gabapentin (NEURONTIN) 300 MG capsule Take 1 capsule (300 mg total) by  mouth at bedtime. 90 capsule 4  . Multiple Vitamin (MULTIVITAMIN) capsule Take 1 capsule by mouth daily.      . Oxycodone HCl 10 MG TABS 1 tab PO Q 6 hours PRN pain.  DO NOT TAKE ANY OTHER PAIN MEDS WHEN TAKING. 25 tablet 0  . oxymorphone (OPANA) 10 MG tablet Take 1 tablet by mouth 2 (two) times daily as needed for pain.   0  . triamterene-hydrochlorothiazide (DYAZIDE) 37.5-25 MG per capsule TAKE 1 EACH (1 CAPSULE TOTAL) BY MOUTH EVERY MORNING. 30 capsule 2   No current facility-administered medications for this visit.   Facility-Administered Medications Ordered in Other Visits  Medication Dose Route Frequency Provider Last Rate Last Dose  . sodium chloride 0.9 % injection 10 mL  10 mL Intravenous PRN Chauncey Cruel, MD   10 mL at 03/04/14 1421    OBJECTIVE: Middle-aged [place] American  woman who appears stated age  59 Vitals:   04/20/15 1641  BP: 122/87  Pulse: 84  Temp: 97.9 F (36.6 C)  Resp: 18     Body mass index is 28.82 kg/(m^2).    ECOG FS: 1 Filed Weights   04/20/15 1641  Weight: 168 lb (76.204 kg)    Sclerae unicteric, pupils round and equal Oropharynx clear and moist-- no thrush or other lesions No cervical or supraclavicular adenopathy, but right shoulder is swollen and indurated Lungs no rales or rhonchi Heart regular rate and rhythm Abd soft, nontender, positive bowel sounds MSK no focal spinal tenderness, but chronic right upper extremity lymphedema, imaged below Neuro: nonfocal, well oriented, appropriate affect Breasts: deferred   photo right upper extremity 04/20/2015    LAB RESULTS:. Results for RHYLEI, MCQUAIG (MRN [id number]) as of 03/16/2015 15:04  Ref. Range 05/17/2012 09:49 10/05/2012 14:28 05/29/2014 13:17 08/21/2014 13:43 12/08/2014 12:29  CA 27.29 Latest Ref Range: 0-39 U/mL 54 (H) 37 _0 Lab Results  Component Value Date   WBC 9.1 04/13/2015   NEUTROABS 6.9* 04/13/2015   HGB 13.1 04/13/2015   HCT 39.2 04/13/2015   MCV 85.6  04/13/2015   PLT 186 04/13/2015      Chemistry      Component Value Date/Time   NA 140 03/16/2015 1439   NA 140 08/14/2014 0100   K 3.6 03/16/2015 1439   K 3.8 08/14/2014 0100   CL 100 08/14/2014 0100   CL 101 03/07/2013 1411   CO2 27 03/16/2015 1439   CO2 28 08/14/2014 0100   BUN 13.2 03/16/2015 1439   BUN 16 08/14/2014 0100   CREATININE 0.9 03/16/2015 1439   CREATININE 0.78 08/14/2014 0100      Component Value Date/Time   CALCIUM 9.7 03/16/2015 1439   CALCIUM 9.5 08/14/2014 0100   ALKPHOS 117 03/16/2015 1439   ALKPHOS 159* 08/14/2014 0100   AST 41* 03/16/2015 1439   AST 47* 08/14/2014 0100   ALT 25 03/16/2015 1439   ALT 35 08/14/2014 0100   BILITOT 0.22 03/16/2015 1439   BILITOT <0.2* 08/14/2014 0100       STUDIES: No results found.  ASSESSMENT:  59 y.o. [place] woman with stage IV breast cancer manifested chiefly by loco-regional nodal disease (neck, chest), without lung, liver, bone, or brain involvement.  (1) Status post right lumpectomy and sentinel lymph node sampling 03/04/2002 for 2 separate foci of invasive ductal carcinoma, mpT2 pN1 or stage IIB, both foci grade 3, both estrogen and progesterone receptor positive, both HercepTest 3+, Mib-1 56%  (2) Reexcision for margins 05/27/2002 showed no residual cancer in the breast.  (3) The patient refused adjuvant systemic therapy.  (4) Adjuvant radiation treatment completed 08/14/2002.  (5) recurrence in the right breast in 02/2004 showing a morphologically different tumor, again grade 3, again estrogen and progesterone receptor negative, with an MIB-1 of 14% and Herceptest 3+.  (5) Between 03/2004 and 07/2004 she received dose dense Doxorubicin/Cyclophosphamide x 4 given with trastuzumab, followed by weekly Docetaxel x 8, again given with trastuzumab.  (6) Right mastectomy 07/13/2004 showed scattered microscopic foci of residual disease over an area  greater than 5 cm. Margins were negative.  (7)  Postoperative Docetaxel continued until 09/2004.  (8) Trastuzumab (Herceptin) given 08/2004 through 01/2012 with some brief interruptions.  (9) Isolated right cervical nodal recurrence 10/2005, treated with radiation to the right supraclavicular area (total 41.5 gray) completed 12/29/2005.  (10) Navelbine given together with Herceptin  11/2005 through  03/2006.  (9) Left mastectomy 02/13/2006 for ductal carcinoma in situ, grade 2, estrogen and progesterone receptor negative, with negative margins; 0 of 3 lymph nodes involved  (10) treated with Lapatinib and Capecitabine before 10/2009, for an unclear duration and with unclear results (cannot locate data on chart review).  (11) Status post right supraclavicular lymph node biopsy 09/2010 again positive for an invasive ductal carcinoma, estrogen and progesterone receptor negative, HER-2 positive by CISH with a ratio 4.25.  (12) Navelbine given together with Herceptin between 05/2011 and 11/2011.  (13) Carboplatin/ Gemcitabine/ Herceptin given for 2 cycles, in 12/2011 and 01/2012.  (14) TDM-1 ([place]) started 02/2012. Last dose 10/02/2013 after which the patient discontinued treatment at her own discretion. Most recent echo on December 2014 showed a well preserved ejection fraction.  (15) Deep vein thrombosis of the right upper extremity documented 04/20/2011.  She completed anticoagulant therapy with Coumadin on 03/25/2013.  (16) Chronic right upper extremity lymphedema.  (17) Right chest port-a-cath removal due to infection on 01/28/2013. Left chest Port-A-Cath placed on 04/08/2013; being flushed every 6 weeks   PLAN: I am concerned that what we may be seeing in Nikisha's right upper extremity is actually skin spread of her cancer. This needs to be biopsied and I am sending her back to general surgery with that in mind.  In the meantime we are trying to get her some home health so her right upper extremity bandages can be changed on a  daily basis. We are also placing a referral to the wound clinic here.she will continue on doxycycline for now.  If we are dealing with local spread of cancer I wonder whether radiation to this area might be useful. Of course we can always resume the T-DM 1 plus or minus chemotherapy.  I have made Hiilei a return appointment with me for June 27. We should have all the information in hand by that time. She will let me know if any other problems develop or then.  Chauncey Cruel, MD  04/20/2015 4:55 PM

## 2015-04-21 ENCOUNTER — Ambulatory Visit: Payer: Commercial Managed Care - PPO | Admitting: Physical Therapy

## 2015-04-21 ENCOUNTER — Telehealth: Payer: Self-pay | Admitting: *Deleted

## 2015-04-21 ENCOUNTER — Telehealth: Payer: Self-pay | Admitting: Oncology

## 2015-04-21 DIAGNOSIS — L989 Disorder of the skin and subcutaneous tissue, unspecified: Secondary | ICD-10-CM

## 2015-04-21 DIAGNOSIS — M25611 Stiffness of right shoulder, not elsewhere classified: Secondary | ICD-10-CM

## 2015-04-21 DIAGNOSIS — I972 Postmastectomy lymphedema syndrome: Secondary | ICD-10-CM | POA: Diagnosis not present

## 2015-04-21 NOTE — Therapy (Signed)
[place], [place], [address] Phone: [phone]   Fax:  [phone]  Physical Therapy Treatment  Patient Details  Name: [name] MRN: [id number] Date of Birth: Jul 15, 1956 Referring Provider:  Chauncey Cruel, MD  Encounter Date: 04/21/2015      PT End of Session - 04/21/15 1345    Visit Number 32   Number of Visits 70   Date for PT Re-Evaluation 06/06/15   PT Start Time 1301   PT Stop Time 1339   PT Time Calculation (min) 38 min   Activity Tolerance Patient tolerated treatment well   Behavior During Therapy [facility] for tasks assessed/performed      Past Medical History  Diagnosis Date  . DVT (deep venous thrombosis) 10/07/2011  . Breast cancer   . Hypertension   . Rheumatic fever     Past Surgical History  Procedure Laterality Date  . Mastectomy      bilateral  . Breast surgery    . Port a cath revision      There were no vitals filed for this visit.  Visit Diagnosis:  Postmastectomy lymphedema  Skin lesion of right upper extremity  Stiffness of joint, shoulder region, right      Subjective Assessment - 04/21/15 1304    Subjective "This turned into something awful.  It looks awful.  I haven't lost my faith."  Went to ED on 04/19/15; they washed wound, dressed it, and gave her an antibiotic, per her report.   Currently in Pain? No/denies   Pain Score --  was 10 this morning   Pain Relieving Factors pain pill            [facility] PT Assessment - 04/21/15 0001    Observation/Other Assessments   Skin Integrity overall wound measurement is 10 cm. wide x 14 cm. long (though wound is not solid for that whole width and length, but rather shows some intact skin within that area); considerable drainage that soaked through ABD pad and gauze placed by staff at Emergency Dept. on 04/19/15.                     [facility] Adult PT Treatment/Exercise - 04/21/15 0001    Manual  Therapy   Edema Management Removed bandage, which came off relatively unimpeded without use of saline today.  Washed wound with soap and water on washcloth first, then rinsed it with patient's saline solution that she brought in.   Compression Bandaging Applied Biotone lotion to right UE excluding open area on proximal posterior arm.  Applied double layer ABD pad to right posterior upper arm covered by elastomull to hold pad in place.  Then applied thick stockinette to entire arm. 1 Elastomull applied to all 5 fingers. Only one Elastomull per patient's request.  Applied Artiflex X2 to right arm with extra padding at hand and elbow.  Applied 5 Comprilan short stretch compression bandages to right arm from hand to axilla.                     Short Term Clinic Goals - 04/16/15 1458    CC Short Term Goal  #1   Title Rt. UE circumference at 15 cm. proximal to olecranon will reduce by at least 1.5 cm.   Baseline 42.6 at initial eval; 40.7 cm. 02/25/15   Status Achieved   CC Short Term Goal  #2   Title Rt. UE circumference at 15 cm. proximal  to ulnar styloid will reduce by at least 2 cm.   Baseline 34.8 cm. at initial eval; 34.9 cm. on 02/25/15   Status On-going             [place] - 04/16/15 1459    CC Long Term Goal  #1   Title Rt. UE circumference at 15 cm. proximal to olecranon will reduce by at least 2 cm.   Baseline 42.6   Status On-going   CC Long Term Goal  #2   Title Rt. UE circumference at 15 cm. proximal to ulnar styloid will reduce by at least 3 cm.   Baseline 34.8   Status On-going   CC Long Term Goal  #3   Title Pt. will have been measured for new daytime compression garments.   Status Achieved   CC Long Term Goal  #4   Title Pt. will be independent in self-management of lymphedema, including manual lymph drainage.   Status On-going            Plan - 04/21/15 1345    Clinical Impression Statement Continues to have significant drainage from  wound.  Is to have a biopsy tomorrow of the right upper arm skin lesion area.  May start wound care next week in [place], where they can get her in sooner than in [place], which is to be 05/19/15.   Pt will benefit from skilled therapeutic intervention in order to improve on the following deficits Decreased skin integrity;Increased edema;Impaired UE functional use   Rehab Potential Fair   Clinical Impairments Affecting Rehab Potential Open sore that needs to be covered with compression   PT Frequency 5x / week   PT Duration 4 weeks   PT Treatment/Interventions Manual lymph drainage;Compression bandaging;Other (comment)  wound care   PT Next Visit Plan continue bandage changes and compression wrapping; manual lymph drainage as time allows   Recommended Other Services sees surgeon for biopsy tomorrow; awaiting evaluation by wound center   Consulted and Agree with Plan of Care Patient        Problem List Patient Active Problem List   Diagnosis Date Noted  . Chronic pain 04/13/2015  . Left arm pain 08/18/2014  . Breast cancer of upper-inner quadrant of right female breast 10/03/2013  . Post-lymphadenectomy lymphedema of arm 08/15/2013  . Port or reservoir infection 01/29/2013  . DVT (deep venous thrombosis) 10/07/2011  . Chest pain 09/10/2009  . Secondary cardiomyopathy 09/02/2009  . Essential hypertension 02/26/2009  . GERD 02/26/2009    Honestee Revard 04/21/2015, 5:25 PM  [place], [place], [address] Phone: [phone]   Fax:  [place], PT 04/21/2015 5:25 PM

## 2015-04-21 NOTE — Progress Notes (Signed)
[facility]  Received a referral for Home Health SN visits for wound care. Unfortunately based on insurance guidelines we are unable to accept the referral since the patient is not homebound and is currently receiving outpatient physical therapy. I have a left a voicemail at [phone] for Mateo Flow, Therapist, sports.   If patient discharges after hours, please call [phone].   Lurlean Leyden 04/21/2015, 11:10 AM

## 2015-04-21 NOTE — Telephone Encounter (Addendum)
Dustina called asking to speak with Dr. Jana Hakim or val about someone coming to her home daily to change the dressing to right arm wound  For Lymphedema.  Observed this note and informed her the order was placed at the end of business day last night.  To expect a call from [place] later today.  Asked that she have her care giver or family present to be taught how by [facility].  Denies anyone living in home with her to teach.  Informed her she will be taught to do the dressings changes if no one else in the home.  Thanked me for the information.

## 2015-04-21 NOTE — Telephone Encounter (Signed)
Received call from McBride at [facility]. She stated that they will not be able to take referral as patient does not meet homebound status.

## 2015-04-21 NOTE — Telephone Encounter (Signed)
Received call from [facility] at lymphedema clinic in reference to continue treatment or wait until after biopsy is done. Per Dr. Jana Hakim patient is to come for dressing changes until home health can be set up but discontinue treatment until after biopsy is done. Spoke with Rose at lymphedema clinis who will relay message to [place].

## 2015-04-21 NOTE — Telephone Encounter (Signed)
Confirmed appointment for 06/27. Also confirmed appointment for Dr.Hoxworth 06/16 @ 10:00. Dr.Cornett did not have any available for 2-3weeks out.

## 2015-04-22 ENCOUNTER — Ambulatory Visit: Payer: Commercial Managed Care - PPO | Admitting: Physical Therapy

## 2015-04-22 DIAGNOSIS — I972 Postmastectomy lymphedema syndrome: Secondary | ICD-10-CM

## 2015-04-22 DIAGNOSIS — L989 Disorder of the skin and subcutaneous tissue, unspecified: Secondary | ICD-10-CM

## 2015-04-22 NOTE — Therapy (Signed)
[name], [place], [address] Phone: [phone]   Fax:  [phone]  Physical Therapy Treatment  Patient Details  Name: [name] MRN: [id number] Date of Birth: Apr 05, 1956 Referring Provider:  Chauncey Cruel, MD  Encounter Date: 04/22/2015      PT End of Session - 04/22/15 1720    Visit Number 33   Number of Visits 70   Date for PT Re-Evaluation 06/06/15   PT Start Time 1450  Pt. late for appt. today   PT Stop Time 1519   PT Time Calculation (min) 29 min   Activity Tolerance Patient tolerated treatment well   Behavior During Therapy [name] for tasks assessed/performed      Past Medical History  Diagnosis Date  . DVT (deep venous thrombosis) 10/07/2011  . Breast cancer   . Hypertension   . Rheumatic fever     Past Surgical History  Procedure Laterality Date  . Mastectomy      bilateral  . Breast surgery    . Port a cath revision      There were no vitals filed for this visit.  Visit Diagnosis:  Postmastectomy lymphedema  Skin lesion of right upper extremity      Subjective [name] - 04/22/15 1453    Subjective "Doing better today, praise God!"   Currently in Pain? Yes   Pain Score --  10 at night   Pain Location Arm   Pain Orientation Right   Effect of Pain on Daily Activities can't sleep            [name] - 04/22/15 0001    Observation/Other Assessments   Skin Integrity looks much as it did yesterday; bandage was soaked with both bloody and clearer discharge; there was an odor to the wound today                     [name] - 04/22/15 0001    Manual Therapy   Edema Management Removed bandage, which came off relatively unimpeded without use of saline today.  Washed wound with soap and water on washcloth first, then rinsed it with patient's saline solution that she brought in.   Compression Bandaging Applied  double layer ABD pad to right posterior upper arm covered by elastomull to hold pad in place.  Then applied thick stockinette to entire arm. 1 Elastomull applied to all 5 fingers.  Applied Artiflex X2 to right arm with extra padding at hand and elbow.  Applied 4 Comprilan short stretch compression bandages to right arm from hand to axilla.                     Short Term Clinic Goals - 04/16/15 1458    CC Short Term Goal  #1   Title Rt. UE circumference at 15 cm. proximal to olecranon will reduce by at least 1.5 cm.   Baseline 42.6 at initial eval; 40.7 cm. 02/25/15   Status Achieved   CC Short Term Goal  #2   Title Rt. UE circumference at 15 cm. proximal to ulnar styloid will reduce by at least 2 cm.   Baseline 34.8 cm. at initial eval; 34.9 cm. on 02/25/15   Status On-going             [name]    CC Long Term Goal  #1   Title Rt. UE circumference at 15 cm.  proximal to olecranon will reduce by at least 2 cm.   Baseline 42.6   Status On-going   CC Long Term Goal  #2   Title Rt. UE circumference at 15 cm. proximal to ulnar styloid will reduce by at least 3 cm.   Baseline 34.8   Status On-going   CC Long Term Goal  #3   Title Pt. will have been measured for [name] daytime compression garments.   Status Achieved   CC Long Term Goal  #4   Title Pt. will be independent in self-management of lymphedema, including manual lymph drainage.   Status On-going            Plan - 04/22/15 1720    Clinical Impression Statement Still significant drainage from wound, which appears unchanged today, though wound is odorous.  Pt. was mistaken and has her biopsy tomorrow--it was not today.   Pt will benefit from skilled therapeutic intervention in order to improve on the following deficits Decreased skin integrity;Increased edema;Impaired UE functional use   Rehab Potential Fair   PT Treatment/Interventions Compression bandaging  wound care   PT Next  Visit Plan One more visit for bandage change; pt. is to transfer to wound [name] in [name] the next day.  Continue to follow for assist with making sure she has appropriate garments and any lymphedema care that she might need beyond what the wound [name] provides.   Recommended Other Services sees surgeon for biopsy tomorrow, then wound [name] appointment the next day   Consulted and Agree with Plan of Care Patient        Problem List Patient Active Problem List   Diagnosis Date Noted  . Chronic pain 04/13/2015  . Left arm pain 08/18/2014  . Breast cancer of upper-inner quadrant of right female breast 10/03/2013  . Post-lymphadenectomy lymphedema of arm 08/15/2013  . Port or reservoir infection 01/29/2013  . DVT (deep venous thrombosis) 10/07/2011  . Chest pain 09/10/2009  . Secondary cardiomyopathy 09/02/2009  . Essential hypertension 02/26/2009  . GERD 02/26/2009    [name] 04/22/2015, 5:24 PM  [name], [place], [address] Phone: [phone]   Fax:  [place], PT 04/22/2015 5:24 PM

## 2015-04-23 ENCOUNTER — Other Ambulatory Visit: Payer: Self-pay | Admitting: General Surgery

## 2015-04-23 ENCOUNTER — Ambulatory Visit: Payer: Commercial Managed Care - PPO | Admitting: Physical Therapy

## 2015-04-23 DIAGNOSIS — L989 Disorder of the skin and subcutaneous tissue, unspecified: Secondary | ICD-10-CM

## 2015-04-23 DIAGNOSIS — M25611 Stiffness of right shoulder, not elsewhere classified: Secondary | ICD-10-CM

## 2015-04-23 DIAGNOSIS — I972 Postmastectomy lymphedema syndrome: Secondary | ICD-10-CM

## 2015-04-23 IMAGING — CR DG CHEST 2V
2 series · 2 of 2 positions shown · non-contrast
Comparison: 08/21/2014

CLINICAL DATA: Right breast cancer. Occasional congestion.
Hypertension.

EXAM:
CHEST  2 VIEW

[w chest pa]
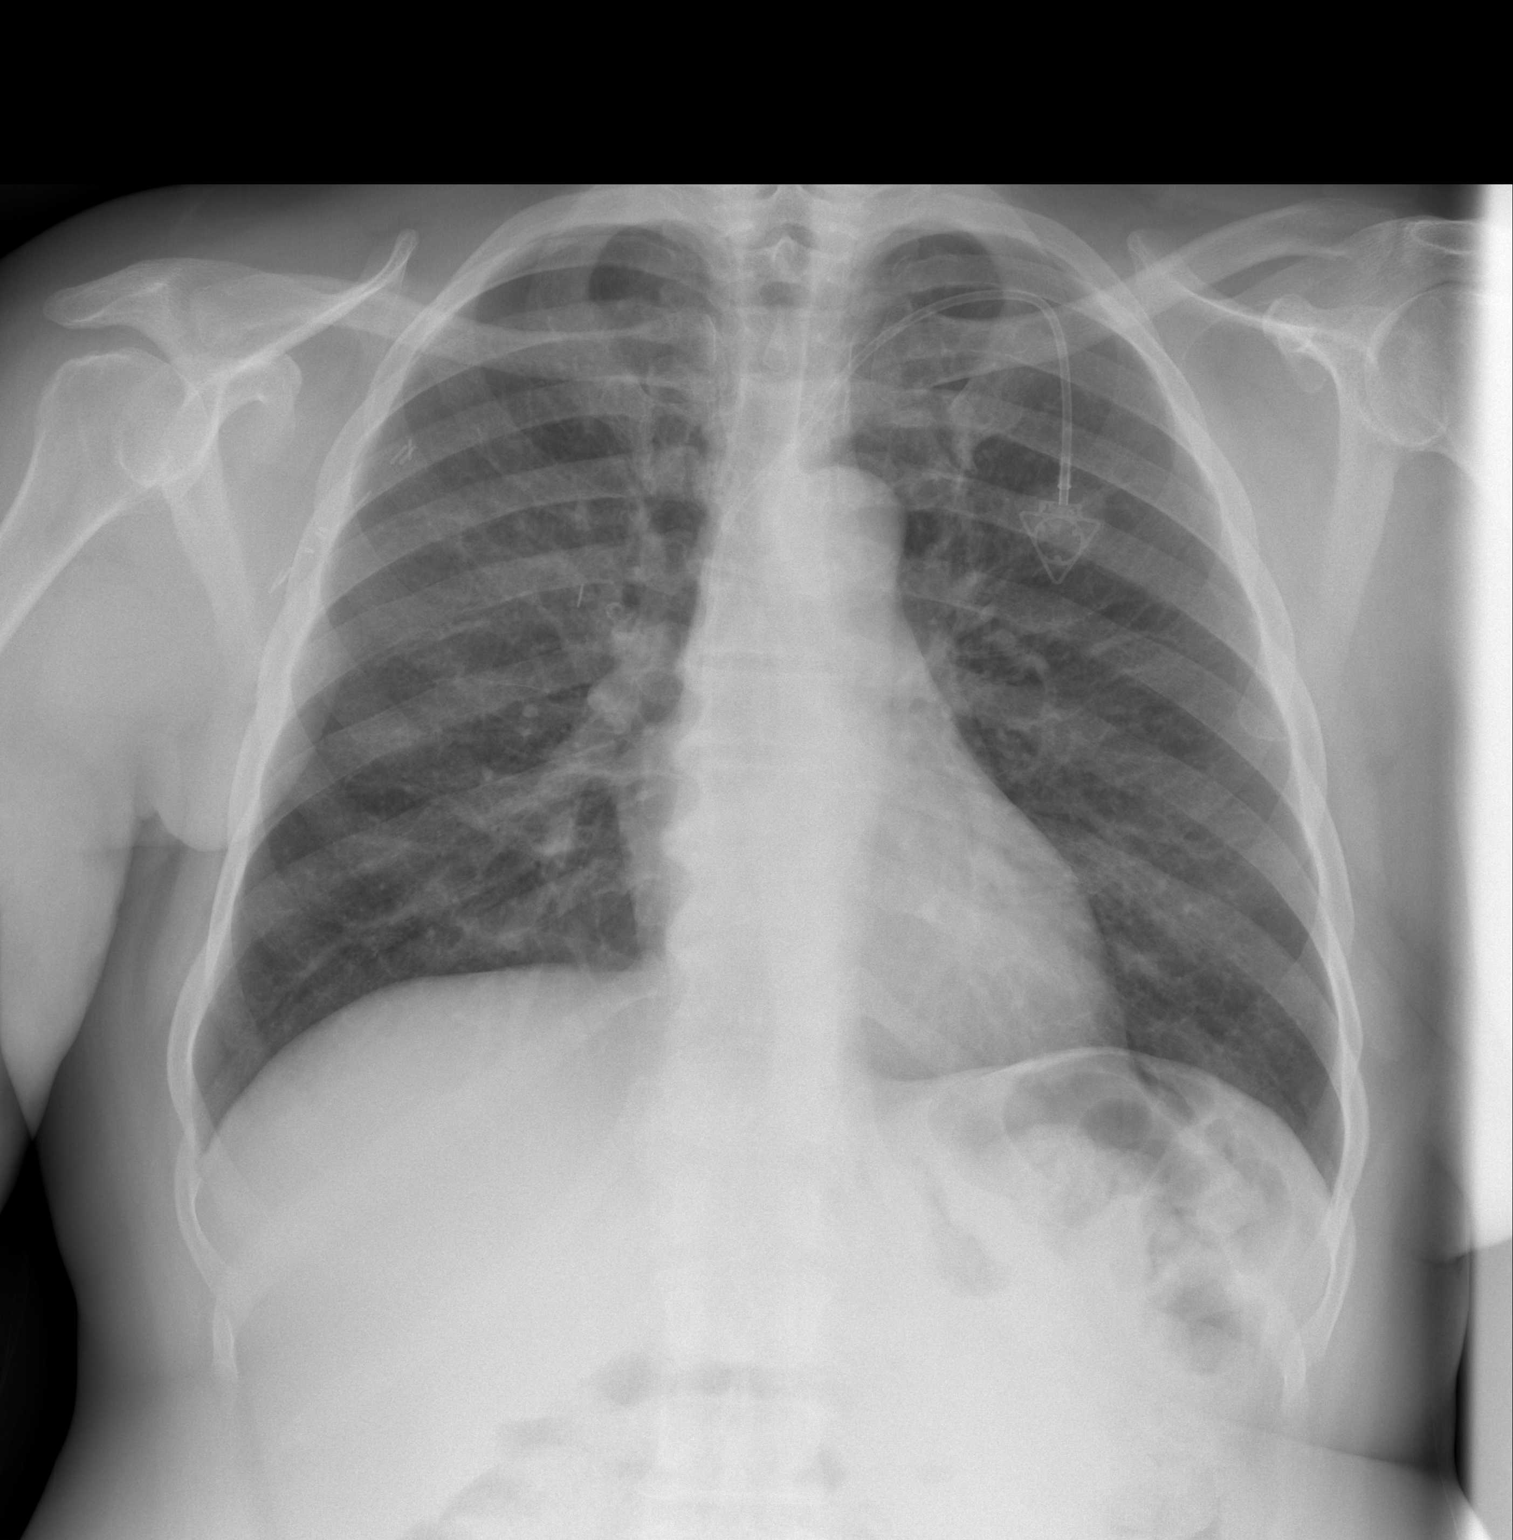

[w chest lat]
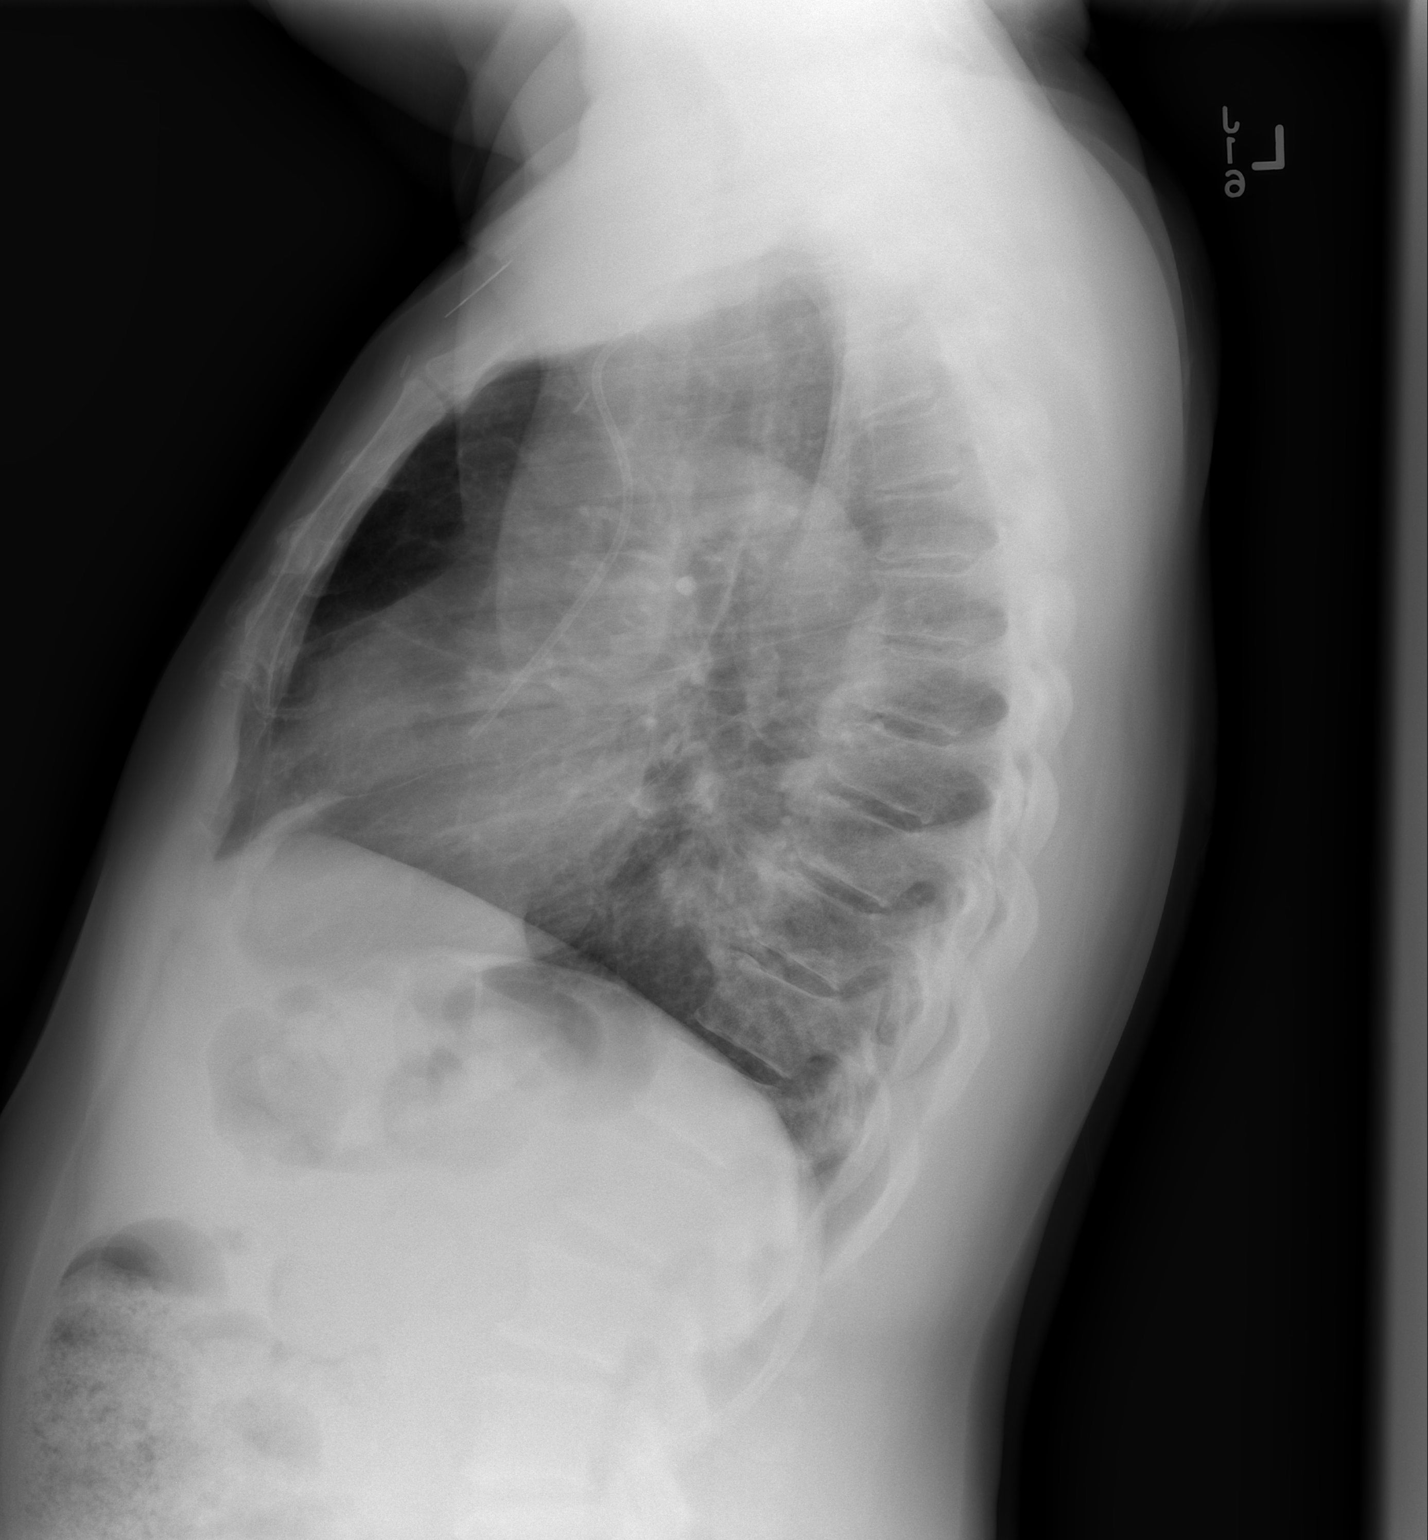

[2 of 2 positions shown; findings below may reference images not displayed]

FINDINGS: Power injectable left-sided Port-A-Cath noted with tip projecting
over the SVC. No pneumothorax.

Right axillary clips. Thoracic spondylosis. The lungs appear clear.
No pleural effusion.
IMPRESSION: 1. Stable appearance of the chest, left-sided Port-A-Cath in place.
Compelling radiographic findings of thoracic metastatic disease.

## 2015-04-23 NOTE — Therapy (Signed)
[place], [place], [address] Phone: [phone]   Fax:  [phone]  Physical Therapy Treatment  Patient Details  Name: [name] MRN: [id number] Date of Birth: [birth year] Referring Provider:  Chauncey Cruel, MD  Encounter Date: 04/23/2015      PT End of Session - 04/23/15 1629    Visit Number 34   Number of Visits 70   Date for PT Re-Evaluation 06/06/15   PT Start Time 3428   PT Stop Time 7681   PT Time Calculation (min) 46 min      Past Medical History  Diagnosis Date  . DVT (deep venous thrombosis) 10/07/2011  . Breast cancer   . Hypertension   . Rheumatic fever     Past Surgical History  Procedure Laterality Date  . Mastectomy      bilateral  . Breast surgery    . Port a cath revision      There were no vitals filed for this visit.  Visit Diagnosis:  Postmastectomy lymphedema  Skin lesion of right upper extremity  Stiffness of joint, shoulder region, right      Subjective Assessment - 04/23/15 1624    Subjective Pt has an appointment at the [facility] tomorrow and expects to be followed by them.     Patient Stated Goals Reduce arm size   Currently in Pain? Yes   Pain Score --  did not rate    Pain Location Arm   Pain Orientation Right   Pain Descriptors / Indicators Aching   Pain Type Chronic pain   Pain Onset More than a month ago   Pain Frequency Constant   Aggravating Factors  swelling    Pain Relieving Factors compression wraps make it feel better                         [facility] Adult PT Treatment/Exercise - 04/23/15 0001    Elbow Exercises   Other elbow exercises AAROM to right shoulder, elbow , forearm, wrist, hand and fingers x 10 repetitions   Manual Therapy   Compression Bandaging Applied double layer ABD pad to right posterior upper arm covered by elastomull to hold pad in place.  Then applied thick stockinette in  2 pieces (  large size at upper arm, medium size to lower arm)  1 Elastomull applied to all 5 fingers.  Applied Artiflex X3 to right arm with extra padding at hand and elbow.  Applied 5 Comprilan short stretch compression bandages to right arm from hand to axilla.                     Short Term Clinic Goals - 04/23/15 1633    CC Short Term Goal  #2   Title Rt. UE circumference at 15 cm. proximal to ulnar styloid will reduce by at least 2 cm.   Status On-going             Long Term Clinic Goals - 04/23/15 1633    CC Long Term Goal  #1   Title Rt. UE circumference at 15 cm. proximal to olecranon will reduce by at least 2 cm.   Status On-going   CC Long Term Goal  #2   Title Rt. UE circumference at 15 cm. proximal to ulnar styloid will reduce by at least 3 cm.   Status On-going   CC Long Term Goal  #4   Title  Pt. will be independent in self-management of lymphedema, including manual lymph drainage.   Status On-going            Plan - 04/23/15 1629    Clinical Impression Statement Pt will be followed by wound clinic so her treatment here will be postponed until she calls [name] back if/when she needs further services.  We will still coordinate getting her compression garments when she is ready for them. Wound on upper arm with fibrosis with skin thickening devoping on forearm with firm edema in hand and fingers continue.   Pt will benefit from skilled therapeutic intervention in order to improve on the following deficits Decreased skin integrity;Increased edema;Impaired UE functional use   Rehab Potential Fair   Clinical Impairments Affecting Rehab Potential Open sore that needs to be covered with compression   PT Next Visit Plan Reassess        Problem List Patient Active Problem List   Diagnosis Date Noted  . Chronic pain 04/13/2015  . Left arm pain 08/18/2014  . Breast cancer of upper-inner quadrant of right female breast 10/03/2013  . Post-lymphadenectomy lymphedema of  arm 08/15/2013  . Port or reservoir infection 01/29/2013  . DVT (deep venous thrombosis) 10/07/2011  . Chest pain 09/10/2009  . Secondary cardiomyopathy 09/02/2009  . Essential hypertension 02/26/2009  . GERD 02/26/2009   Donato Heinz. [facility] Shark PT   Norwood Levo 04/23/2015, 4:35 PM  [place], [place], [address] Phone: [phone]   Fax:  [phone]

## 2015-04-24 ENCOUNTER — Encounter: Payer: Commercial Managed Care - PPO | Attending: Surgery | Admitting: Surgery

## 2015-04-24 DIAGNOSIS — I10 Essential (primary) hypertension: Secondary | ICD-10-CM | POA: Insufficient documentation

## 2015-04-24 DIAGNOSIS — S41101A Unspecified open wound of right upper arm, initial encounter: Secondary | ICD-10-CM | POA: Diagnosis not present

## 2015-04-24 DIAGNOSIS — I972 Postmastectomy lymphedema syndrome: Secondary | ICD-10-CM | POA: Diagnosis not present

## 2015-04-24 DIAGNOSIS — Z853 Personal history of malignant neoplasm of breast: Secondary | ICD-10-CM | POA: Insufficient documentation

## 2015-04-24 DIAGNOSIS — X58XXXA Exposure to other specified factors, initial encounter: Secondary | ICD-10-CM | POA: Insufficient documentation

## 2015-04-27 NOTE — Progress Notes (Addendum)
[name], [name] ([id number]) Visit Report for 04/24/2015 Chief Complaint Document Details Patient Name: [name] Date of Service: 04/24/2015 2:30 PM Medical Record Number: [id number] Patient Account Number: [id number] Date of Birth/Sex: June 04, 1956 (59 y.o. Female) Treating RN: Primary Care Physician: [name] Other Clinician: Referring Physician: [name] Treating Physician/Extender: Frann Rider in Treatment: 0 Information Obtained from: Patient Chief Complaint Patient presents to the wound care center for a consult due non healing wound. The patient was known to have bilateral lymphedema as a status post mastectomy syndrome now comes with an ulcerated area of her right upper arm which she's had for several weeks. Electronic Signature(s) Signed: 04/24/2015 3:45:34 PM By: [name], FACS Entered By: [name] on 04/24/2015 15:09:39 [name] ([id number]) -------------------------------------------------------------------------------- HPI Details Patient Name: [name] Date of Service: 04/24/2015 2:30 PM Medical Record Number: [id number] Patient Account Number: [id number] Date of Birth/Sex: 1956-08-01 (58 y.o. Female) Treating RN: Primary Care Physician: [name] Other Clinician: Referring Physician: [name] Treating Physician/Extender: Frann Rider in Treatment: 0 History of Present Illness Location: bilateral arm lymphedema with now skin ulceration of the right upper extremity. Quality: Patient reports experiencing a dull pain to affected area(s). Severity: Patient states wound are getting worse. Duration: Patient has had the wound for > 3 months prior to seeking treatment at the wound center Timing: Pain in wound is Intermittent (comes and goes Context: The wound occurred when the patiento. Modifying Factors: Consults to this date include: local care and antibiotics as prescribed by the PCP and the  ER. Associated Signs and Symptoms: Patient reports having increase discharge. HPI Description: 59 y.o. [place] woman with stage IV breast cancer, originally diagnosed in April 2003 and now manifested chiefly by loco-regional nodal disease (neck, chest), without lung, liver, bone, or brain involvement. he has received extensive radiation and chemotherapy after the surgeries. She's always had bilateral lymphedema of her arms but now comes with an ulcerated area on her right upper arm posteriorly. Her medical Oncologist, Dr.Gustav [name], was concerned that what we may be seeing in [name]'s right upper extremity is actually skin spread of her cancer. This needs to be biopsied and she was sending her back to general surgery with that in mind. She placed a referral to the wound clinic . She has been on doxycycline for now. The general surgeon saw her 2 days ago and did a biopsy and this report is pending. In April 2016 she had a right upper arm venous duplex study which showed: No evidence of deep vein or superficial thrombosis involving the ooright upper extremity and left subclavian vein. Moderate edema noted throughout the right upper extremity. Electronic Signature(s) Signed: 04/24/2015 3:45:34 PM By: [name], FACS Entered By: [name] on 04/24/2015 15:10:57 [name] ([id number]) -------------------------------------------------------------------------------- Physical Exam Details Patient Name: [name] Date of Service: 04/24/2015 2:30 PM Medical Record Number: [id number] Patient Account Number: [id number] Date of Birth/Sex: 1956-04-22 (58 y.o. Female) Treating RN: Primary Care Physician: [name] Other Clinician: Referring Physician: [name] Treating Physician/Extender: Frann Rider in Treatment: 0 Constitutional . Pulse regular. Respirations normal and unlabored. Afebrile. . Eyes Nonicteric. Reactive to light. Ears, Nose, Mouth,  and Throat Lips, teeth, and gums WNL.Marland Kitchen Moist mucosa without lesions . Neck supple and nontender. No palpable supraclavicular or cervical adenopathy. Normal sized without goiter. Respiratory WNL. No retractions.. Cardiovascular Pedal Pulses WNL. No clubbing, cyanosis or edema. Chest she has got extensive scarring of the axilla and the posterior axillary region and the area  has some nodules which may be advanced disease.Marland Kitchen Lymphatic she has got nodules in the axilla and the post axillary region which may be in fact. Psychiatric Judgement and insight Intact.. No evidence of depression, anxiety, or agitation.. Notes The right upper extremity shows classic post mastectomy lymphedema and in the area of the posterior upper arm she has got open ulceration which is quite large. This may in fact be a manifestation of lymphosarcoma. Electronic Signature(s) Signed: 04/24/2015 3:45:34 PM By: [name], FACS Entered By: [name] on 04/24/2015 15:14:08 [name] ([id number]) -------------------------------------------------------------------------------- Physician Orders Details Patient Name: [name] Date of Service: 04/24/2015 2:30 PM Medical Record Number: [id number] Patient Account Number: [id number] Date of Birth/Sex: 03-Oct-1956 (58 y.o. Female) Treating RN: Montey Hora Primary Care Physician: [name] Other Clinician: Referring Physician: [name] Treating Physician/Extender: Frann Rider in Treatment: 0 Verbal / Phone Orders: Yes Clinician: Montey Hora Read Back and Verified: Yes Diagnosis Coding ICD-10 Coding Code Description S41.101A Unspecified open wound of right upper arm, initial encounter Z85.3 Personal history of malignant neoplasm of breast I97.2 Postmastectomy lymphedema syndrome Wound Cleansing Wound #1 Right,Posterior Upper Arm o Clean wound with Normal Saline. o May Shower, gently pat wound dry prior to applying new  dressing. Anesthetic Wound #1 Right,Posterior Upper Arm o Topical Lidocaine 4% cream applied to wound bed prior to debridement Primary Wound Dressing Wound #1 Right,Posterior Upper Arm o Aquacel Ag o Other: Secondary Dressing Wound #1 Right,Posterior Upper Arm o ABD and Kerlix/Conform Dressing Change Frequency Wound #1 Right,Posterior Upper Arm o Change dressing every other day. Follow-up Appointments Wound #1 Right,Posterior Upper Arm o Return Appointment in 1 week. Edema Control Wound #1 Right,Posterior Upper Arm [name], [name] ([id number]) o Other: - compression sleeve Electronic Signature(s) Signed: 04/24/2015 3:56:13 PM By: Montey Hora Signed: 04/27/2015 12:15:23 PM By: [name], FACS Entered By: Montey Hora on 04/24/2015 15:56:13 [name] ([id number]) -------------------------------------------------------------------------------- Problem List Details Patient Name: [name] Date of Service: 04/24/2015 2:30 PM Medical Record Number: [id number] Patient Account Number: [id number] Date of Birth/Sex: [birth year] (58 y.o. Female) Treating RN: Primary Care Physician: [name] Other Clinician: Referring Physician: [name] Treating Physician/Extender: Frann Rider in Treatment: 0 Active Problems ICD-10 Encounter Code Description Active Date Diagnosis S41.101A Unspecified open wound of right upper arm, initial 04/24/2015 Yes encounter Z85.3 Personal history of malignant neoplasm of breast 04/24/2015 Yes I97.2 Postmastectomy lymphedema syndrome 04/24/2015 Yes Inactive Problems Resolved Problems Electronic Signature(s) Signed: 04/24/2015 3:45:34 PM By: [name], FACS Entered By: [name] on 04/24/2015 15:09:23 [name] ([id number]) -------------------------------------------------------------------------------- Progress Note Details Patient Name: [name] Date of Service: 04/24/2015 2:30  PM Medical Record Number: [id number] Patient Account Number: [id number] Date of Birth/Sex: Jan 27, 1956 (58 y.o. Female) Treating RN: Primary Care Physician: [name] Other Clinician: Referring Physician: [name] Treating Physician/Extender: Frann Rider in Treatment: 0 Subjective Chief Complaint Information obtained from Patient Patient presents to the wound care center for a consult due non healing wound. The patient was known to have bilateral lymphedema as a status post mastectomy syndrome now comes with an ulcerated area of her right upper arm which she's had for several weeks. History of Present Illness (HPI) The following HPI elements were documented for the patient's wound: Location: bilateral arm lymphedema with now skin ulceration of the right upper extremity. Quality: Patient reports experiencing a dull pain to affected area(s). Severity: Patient states wound are getting worse. Duration: Patient has had the wound for > 3 months prior to seeking  treatment at the wound center Timing: Pain in wound is Intermittent (comes and goes Context: The wound occurred when the patient . Modifying Factors: Consults to this date include: local care and antibiotics as prescribed by the PCP and the ER. Associated Signs and Symptoms: Patient reports having increase discharge. 59 y.o. [place] woman with stage IV breast cancer, originally diagnosed in April 2003 and now manifested chiefly by loco-regional nodal disease (neck, chest), without lung, liver, bone, or brain involvement. he has received extensive radiation and chemotherapy after the surgeries. She's always had bilateral lymphedema of her arms but now comes with an ulcerated area on her right upper arm posteriorly. Her medical Oncologist, Dr.Gustav [name], was concerned that what we may be seeing in Deneane's right upper extremity is actually skin spread of her cancer. This needs to be biopsied and she was  sending her back to general surgery with that in mind. She placed a referral to the wound clinic . She has been on doxycycline for now. The general surgeon saw her 2 days ago and did a biopsy and this report is pending. In April 2016 she had a right upper arm venous duplex study which showed: No evidence of deep vein or superficial thrombosis involving the right upper extremity and left subclavian vein. Moderate edema noted throughout the right upper extremity. Wound History Patient presents with 1 open wound that has been present for approximately 1 month. Patient has been treating wound in the following manner: no. Laboratory tests have not been performed in the last month. Patient reportedly has not tested positive for an antibiotic resistant organism. Patient reportedly has not tested positive for osteomyelitis. Patient reportedly has not had testing performed to evaluate circulation in the legs. Patient experiences the following problems associated with their wounds: swelling. [name], [name] ([id number]) Patient History Information obtained from Patient. Allergies No Known Allergies Family History Cancer - Siblings, Mother, Kidney Disease - Siblings, No family history of Diabetes, Heart Disease, Hereditary Spherocytosis, Hypertension, Lung Disease, Seizures, Stroke, Thyroid Problems, Tuberculosis. Social History Current every day smoker, Marital Status - Married, Alcohol Use - Never, Drug Use - No History, Caffeine Use - Daily. Medical History Eyes Denies history of Cataracts, Glaucoma, Optic Neuritis Ear/Nose/Mouth/Throat Denies history of Chronic sinus problems/congestion, Middle ear problems Hematologic/Lymphatic Denies history of Anemia, Hemophilia, Human Immunodeficiency Virus, Lymphedema, Sickle Cell Disease Respiratory Denies history of Aspiration, Asthma, Chronic Obstructive Pulmonary Disease (COPD), Pneumothorax, Sleep Apnea, Tuberculosis Cardiovascular Patient has  history of Deep Vein Thrombosis - right arm, Hypertension Denies history of Angina, Arrhythmia, Congestive Heart Failure, Coronary Artery Disease, Hypotension, Myocardial Infarction, Peripheral Arterial Disease, Peripheral Venous Disease, Phlebitis, Vasculitis Gastrointestinal Denies history of Cirrhosis , Colitis, Crohn s, Hepatitis A, Hepatitis B, Hepatitis C Endocrine Denies history of Type I Diabetes, Type II Diabetes Genitourinary Denies history of End Stage Renal Disease Immunological Denies history of Lupus Erythematosus, Raynaud s, Scleroderma Integumentary (Skin) Denies history of History of Burn, History of pressure wounds Musculoskeletal Denies history of Gout, Rheumatoid Arthritis, Osteoarthritis, Osteomyelitis Neurologic Denies history of Dementia, Neuropathy, Quadriplegia, Paraplegia, Seizure Disorder Oncologic Patient has history of Received Chemotherapy - 2014 last dose, Received Radiation - 2003 Medical And Surgical History Notes Musculoskeletal rheumatic fever [name], [name] ([id number]) Oncologic breast cancer post bilateral mastectomy Review of Systems (ROS) Constitutional Symptoms (General Health) The patient has no complaints or symptoms. Eyes The patient has no complaints or symptoms. Ear/Nose/Mouth/Throat The patient has no complaints or symptoms. Hematologic/Lymphatic The patient has no complaints or symptoms. Cardiovascular The patient has  no complaints or symptoms. Genitourinary The patient has no complaints or symptoms. Immunological The patient has no complaints or symptoms. Integumentary (Skin) The patient has no complaints or symptoms. Musculoskeletal The patient has no complaints or symptoms. Neurologic The patient has no complaints or symptoms. Medications: I have reviewed her list of medications with include alprazolam, vitamin C, vitamin D, doxycycline, oxycodone, triamterene. Objective Constitutional Pulse regular. Respirations  normal and unlabored. Afebrile. Vitals Time Taken: 2:30 PM, Height: 65 in, Source: Stated, Weight: 168 lbs, Source: Stated, BMI: 28, Temperature: 98.0 F, Pulse: 85 bpm, Respiratory Rate: 16 breaths/min, Blood Pressure: 112/89 mmHg. Eyes Nonicteric. Reactive to light. Ears, Nose, Mouth, and Throat Lips, teeth, and gums WNL.Marland Kitchen Moist mucosa without lesions . Neck [name], [name] ([id number]) supple and nontender. No palpable supraclavicular or cervical adenopathy. Normal sized without goiter. Respiratory WNL. No retractions.. Cardiovascular Pedal Pulses WNL. No clubbing, cyanosis or edema. Chest she has got extensive scarring of the axilla and the posterior axillary region and the area has some nodules which may be advanced disease.Marland Kitchen Lymphatic she has got nodules in the axilla and the post axillary region which may be in fact. Psychiatric Judgement and insight Intact.. No evidence of depression, anxiety, or agitation.. General Notes: The right upper extremity shows classic post mastectomy lymphedema and in the area of the posterior upper arm she has got open ulceration which is quite large. This may in fact be a manifestation of lymphosarcoma. Integumentary (Hair, Skin) Wound #1 status is Open. Original cause of wound was Gradually Appeared. The wound is located on the Right,Posterior Upper Arm. The wound measures 13.5cm length x 15cm width x 0.2cm depth; 159.043cm^2 area and 31.809cm^3 volume. The wound is limited to skin breakdown. There is no tunneling or undermining noted. There is a large amount of serous drainage noted. The wound margin is flat and intact. There is medium (34-66%) pink granulation within the wound bed. There is a medium (34-66%) amount of necrotic tissue within the wound bed including Adherent Slough. The periwound skin appearance did not exhibit: Callus, Crepitus, Excoriation, Fluctuance, Friable, Induration, Localized Edema, Rash, Scarring, Dry/Scaly, Maceration,  Moist, Atrophie Blanche, Cyanosis, Ecchymosis, Hemosiderin Staining, Mottled, Pallor, Rubor, Erythema. Periwound temperature was noted as No Abnormality. Assessment Active Problems ICD-10 S41.101A - Unspecified open wound of right upper arm, initial encounter Z85.3 - Personal history of malignant neoplasm of breast I97.2 - Postmastectomy lymphedema syndrome [name], [name] ([id number]) This 59 year old patient whose had a right arm postmastectomy lymphedema for several years has now developed a open excoriated wound on the posterior part of her right upper arm near the axillary region. His is very suspicious for either a lymphosarcoma or metastatic locally advanced breast disease. In either case biopsy has been recently taken and we will await this pathology. I have stopped her treatment with the lymphedema pump and have recommended that we cover this area with Aquacel Ag, Carboflex and a light wrap with Kerlix. I have discussed with her at length, that the management is going to be totally palliative and it is her medical oncologist, radiation therapist and surgeon who will decide active treatment of the disease process. We'll continue to manage the wound so as to make the drainage and odor controllable. She fully understands the treatment plan and says she will be compliant. Plan Wound Cleansing: Wound #1 Right,Posterior Upper Arm: Clean wound with Normal Saline. May Shower, gently pat wound dry prior to applying new dressing. Anesthetic: Wound #1 Right,Posterior Upper Arm: Topical Lidocaine 4% cream applied to wound  bed prior to debridement Primary Wound Dressing: Wound #1 Right,Posterior Upper Arm: Aquacel Ag Other: Secondary Dressing: Wound #1 Right,Posterior Upper Arm: ABD and Kerlix/Conform Dressing Change Frequency: Wound #1 Right,Posterior Upper Arm: Change dressing every other day. Follow-up Appointments: Wound #1 Right,Posterior Upper Arm: Return Appointment in 1  week. Edema Control: Wound #1 Right,Posterior Upper Arm: Other: - compression sleeve This 59 year old patient whose had a right arm postmastectomy lymphedema for several years has now developed a open excoriated wound on the posterior part of her right upper arm near the axillary region. His is very suspicious for either a lymphosarcoma or metastatic locally advanced breast disease. [name], [name] ([id number]) In either case biopsy has been recently taken and we will await this pathology. I have stopped her treatment with the lymphedema pump and have recommended that we cover this area with Aquacel Ag, Carboflex and a light wrap with Kerlix. I have discussed with her at length, that the management is going to be totally palliative and it is her medical oncologist, radiation therapist and surgeon who will decide active treatment of the disease process. We'll continue to manage the wound so as to make the drainage and odor controllable. She fully understands the treatment plan and says she will be compliant. Electronic Signature(s) Signed: 04/27/2015 4:25:54 PM By: [name], FACS Previous Signature: 04/24/2015 3:45:34 PM Version By: [name], FACS Entered By: [name] on 04/27/2015 16:23:03 [name] ([id number]) -------------------------------------------------------------------------------- ROS/PFSH Details Patient Name: [name] Date of Service: 04/24/2015 2:30 PM Medical Record Number: [id number] Patient Account Number: [id number] Date of Birth/Sex: 1956/04/21 (59 y.o. Female) Treating RN: Montey Hora Primary Care Physician: [name] Other Clinician: Referring Physician: [name] Treating Physician/Extender: Frann Rider in Treatment: 0 Information Obtained From Patient Wound History Do you currently have one or more open woundso Yes How many open wounds do you currently haveo 1 Approximately how long have you had your woundso 1  month How have you been treating your wound(s) until nowo no Has your wound(s) ever healed and then re-openedo No Have you had any lab work done in the past montho No Have you tested positive for an antibiotic resistant organism (MRSA, VRE)o No Have you tested positive for osteomyelitis (bone infection)o No Have you had any tests for circulation on your legso No Have you had other problems associated with your woundso Swelling Constitutional Symptoms (General Health) Complaints and Symptoms: No Complaints or Symptoms Eyes Complaints and Symptoms: No Complaints or Symptoms Medical History: Negative for: Cataracts; Glaucoma; Optic Neuritis Ear/Nose/Mouth/Throat Complaints and Symptoms: No Complaints or Symptoms Medical History: Negative for: Chronic sinus problems/congestion; Middle ear problems Hematologic/Lymphatic Complaints and Symptoms: No Complaints or Symptoms Medical HistoryDHANA, [name] ([id number]) Negative for: Anemia; Hemophilia; Human Immunodeficiency Virus; Lymphedema; Sickle Cell Disease Respiratory Medical History: Negative for: Aspiration; Asthma; Chronic Obstructive Pulmonary Disease (COPD); Pneumothorax; Sleep Apnea; Tuberculosis Cardiovascular Complaints and Symptoms: No Complaints or Symptoms Medical History: Positive for: Deep Vein Thrombosis - right arm; Hypertension Negative for: Angina; Arrhythmia; Congestive Heart Failure; Coronary Artery Disease; Hypotension; Myocardial Infarction; Peripheral Arterial Disease; Peripheral Venous Disease; Phlebitis; Vasculitis Gastrointestinal Medical History: Negative for: Cirrhosis ; Colitis; Crohnos; Hepatitis A; Hepatitis B; Hepatitis C Endocrine Medical History: Negative for: Type I Diabetes; Type II Diabetes Genitourinary Complaints and Symptoms: No Complaints or Symptoms Medical History: Negative for: End Stage Renal Disease Immunological Complaints and Symptoms: No Complaints or Symptoms Medical  History: Negative for: Lupus Erythematosus; Raynaudos; Scleroderma Integumentary (Skin) Complaints and Symptoms: No Complaints or Symptoms  Medical History: Negative for: History of Burn; History of pressure wounds Wasilewski, Bridgette ([id number]) Musculoskeletal Complaints and Symptoms: No Complaints or Symptoms Medical History: Negative for: Gout; Rheumatoid Arthritis; Osteoarthritis; Osteomyelitis Past Medical History Notes: rheumatic fever Neurologic Complaints and Symptoms: No Complaints or Symptoms Medical History: Negative for: Dementia; Neuropathy; Quadriplegia; Paraplegia; Seizure Disorder Oncologic Medical History: Positive for: Received Chemotherapy - 2014 last dose; Received Radiation - 2003 Past Medical History Notes: breast cancer post bilateral mastectomy Family and Social History Cancer: Yes - Siblings, Mother; Diabetes: No; Heart Disease: No; Hereditary Spherocytosis: No; Hypertension: No; Kidney Disease: Yes - Siblings; Lung Disease: No; Seizures: No; Stroke: No; Thyroid Problems: No; Tuberculosis: No; Current every day smoker; Marital Status - Married; Alcohol Use: Never; Drug Use: No History; Caffeine Use: Daily; Financial Concerns: No; Food, Clothing or Shelter Needs: No; Support System Lacking: No; Transportation Concerns: No; Advanced Directives: No; Patient does not want information on Advanced Directives; Medical Power of Attorney: No Physician Affirmation I have reviewed and agree with the above information. Electronic Signature(s) Signed: 04/24/2015 3:45:34 PM By: [name], FACS Signed: 04/24/2015 4:30:07 PM By: Montey Hora Entered By: [name] on 04/24/2015 14:45:40 [name] ([id number]) -------------------------------------------------------------------------------- SuperBill Details Patient Name: [name] Date of Service: 04/24/2015 Medical Record Number: [id number] Patient Account Number: [id number] Date of Birth/Sex: December 18, 1955  (58 y.o. Female) Treating RN: Primary Care Physician: [name] Other Clinician: Referring Physician: [name] Treating Physician/Extender: Frann Rider in Treatment: 0 Diagnosis Coding ICD-10 Codes Code Description S41.101A Unspecified open wound of right upper arm, initial encounter Z85.3 Personal history of malignant neoplasm of breast I97.2 Postmastectomy lymphedema syndrome Facility Procedures CPT4 Code: 42395320 Description: 99214 - WOUND CARE VISIT-LEV 4 EST PT Modifier: Quantity: 1 Physician Procedures CPT4 Code: 2334356 Description: 86168 - WC PHYS LEVEL 4 - NEW PT ICD-10 Description Diagnosis S41.101A Unspecified open wound of right upper arm, initi Z85.3 Personal history of malignant neoplasm of breast I97.2 Postmastectomy lymphedema syndrome Modifier: al encounter Quantity: 1 Electronic Signature(s) Signed: 04/24/2015 3:45:34 PM By: [name], FACS Entered By: [name] on 04/24/2015 15:37:31

## 2015-04-28 ENCOUNTER — Ambulatory Visit: Payer: Commercial Managed Care - PPO | Admitting: Physical Therapy

## 2015-04-29 ENCOUNTER — Other Ambulatory Visit: Payer: Self-pay | Admitting: *Deleted

## 2015-04-29 DIAGNOSIS — C50211 Malignant neoplasm of upper-inner quadrant of right female breast: Secondary | ICD-10-CM

## 2015-04-30 ENCOUNTER — Other Ambulatory Visit: Payer: Commercial Managed Care - PPO

## 2015-05-01 ENCOUNTER — Encounter: Payer: Commercial Managed Care - PPO | Admitting: Surgery

## 2015-05-01 ENCOUNTER — Ambulatory Visit: Payer: Commercial Managed Care - PPO | Admitting: Physical Therapy

## 2015-05-01 ENCOUNTER — Telehealth: Payer: Self-pay | Admitting: Oncology

## 2015-05-01 DIAGNOSIS — Z853 Personal history of malignant neoplasm of breast: Secondary | ICD-10-CM | POA: Diagnosis not present

## 2015-05-01 DIAGNOSIS — S41101A Unspecified open wound of right upper arm, initial encounter: Secondary | ICD-10-CM | POA: Diagnosis not present

## 2015-05-01 DIAGNOSIS — I972 Postmastectomy lymphedema syndrome: Secondary | ICD-10-CM | POA: Diagnosis not present

## 2015-05-01 NOTE — Telephone Encounter (Signed)
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 Left message to confirm appointment with lab on 06/27 due to missed lab on 06/23.

## 2015-05-01 NOTE — Progress Notes (Addendum)
[name] ([id number]) Visit Report for 05/01/2015 Chief Complaint Document Details Patient Name: [name], [name] 05/01/2015 11:45 Date of Service: AM Medical Record [id number] Number: Patient Account Number: [id number] September 27, 1956 (59 y.o. Treating RN: Date of Birth/Sex: Female) Other Clinician: Primary Care Physician: [name] Treating Brylin Stopper Referring Physician: [name] Physician/Extender: Suella Grove in Treatment: 1 Information Obtained from: Patient Chief Complaint Patient presents to the wound care center for a consult due non healing wound. The patient was known to have bilateral lymphedema as a status post mastectomy syndrome now comes with an ulcerated area of her right upper arm which she's had for several weeks. Electronic Signature(s) Signed: 05/01/2015 12:18:05 PM By: [name], FACS Entered By: [name] on 05/01/2015 12:18:05 [name] ([id number]) -------------------------------------------------------------------------------- HPI Details Patient Name: [name] 05/01/2015 11:45 Date of Service: AM Medical Record [id number] Number: Patient Account Number: [id number] [birth year] (59 y.o. Treating RN: Date of Birth/Sex: Female) Other Clinician: Primary Care Physician: [name] Treating Othar Curto Referring Physician: [name] Physician/Extender: Suella Grove in Treatment: 1 History of Present Illness Location: bilateral arm lymphedema with now skin ulceration of the right upper extremity. Quality: Patient reports experiencing a dull pain to affected area(s). Severity: Patient states wound are getting worse. Duration: Patient has had the wound for > 3 months prior to seeking treatment at the wound center Timing: Pain in wound is Intermittent (comes and goes Context: The wound occurred when the patiento. Modifying Factors: Consults to this date include: local care and antibiotics as prescribed by the PCP and the  ER. Associated Signs and Symptoms: Patient reports having increase discharge. HPI Description: 59 y.o. [place] woman with stage IV breast cancer, originally diagnosed in April 2003 and now manifested chiefly by loco-regional nodal disease (neck, chest), without lung, liver, bone, or brain involvement. he has received extensive radiation and chemotherapy after the surgeries. She's always had bilateral lymphedema of her arms but now comes with an ulcerated area on her right upper arm posteriorly. Her medical Oncologist, Dr.Gustav [name], was concerned that what we may be seeing in Lilymae's right upper extremity is actually skin spread of her cancer. This needs to be biopsied and she was sending her back to general surgery with that in mind. She placed a referral to the wound clinic . She has been on doxycycline for now. The general surgeon saw her 2 days ago and did a biopsy and this report is pending. In April 2016 she had a right upper arm venous duplex study which showed: No evidence of deep vein or superficial thrombosis involving the ooright upper extremity and left subclavian vein. Moderate edema noted throughout the right upper extremity. 05/01/2015 the patient tells me that her surgeon revealed that her biopsy was negative and that there was no pathology found. Other than that she is doing well and she thinks her edema has gone down a bit. Electronic Signature(s) Signed: 05/01/2015 12:18:48 PM By: [name], FACS Entered By: [name] on 05/01/2015 12:18:48 [name] ([id number]) -------------------------------------------------------------------------------- Physical Exam Details Patient Name: [name] 05/01/2015 11:45 Date of Service: AM Medical Record [id number] Number: Patient Account Number: [id number] [birth year] (59 y.o. Treating RN: Date of Birth/Sex: Female) Other Clinician: Primary Care Physician: [name] Treating Frankye Schwegel Referring  Physician: [name] Physician/Extender: Weeks in Treatment: 1 Constitutional . Pulse regular. Respirations normal and unlabored. Afebrile. . Eyes Nonicteric. Reactive to light. Ears, Nose, Mouth, and Throat Lips, teeth, and gums WNL.Marland Kitchen Moist mucosa without lesions . Neck supple and nontender. No palpable  supraclavicular or cervical adenopathy. Normal sized without goiter. Respiratory WNL. No retractions.. Cardiovascular Pedal Pulses WNL. No clubbing, cyanosis or edema. Chest right axilla and arm show massive lymphedema with large ulcerated area on the posterior upper arm.Marland Kitchen Psychiatric Judgement and insight Intact.. No evidence of depression, anxiety, or agitation.. Electronic Signature(s) Signed: 05/01/2015 12:19:42 PM By: [name], FACS Entered By: [name] on 05/01/2015 12:19:42 [name] ([id number]) -------------------------------------------------------------------------------- Physician Orders Details Patient Name: [name], [name] 05/01/2015 11:45 Date of Service: AM Medical Record [id number] Number: Patient Account Number: [id number] Sep 02, 1956 (59 y.o. Treating RN: Baruch Gouty, RN, BSN, Velva Harman Date of Birth/Sex: Female) Other Clinician: Primary Care Physician: [name] Treating Shaddai Shapley Referring Physician: [name] Physician/Extender: Suella Grove in Treatment: 1 Verbal / Phone Orders: Yes Clinician: Afful, RN, BSN, Rita Read Back and Verified: Yes Diagnosis Coding Wound Cleansing Wound #1 Right,Posterior Upper Arm o Clean wound with Normal Saline. o May Shower, gently pat wound dry prior to applying new dressing. Anesthetic Wound #1 Right,Posterior Upper Arm o Topical Lidocaine 4% cream applied to wound bed prior to debridement Primary Wound Dressing Wound #1 Right,Posterior Upper Arm o Aquacel Ag o Other: Secondary Dressing Wound #1 Right,Posterior Upper Arm o ABD and Kerlix/Conform Dressing Change  Frequency Wound #1 Right,Posterior Upper Arm o Change dressing every other day. Follow-up Appointments Wound #1 Right,Posterior Upper Arm o Return Appointment in 1 week. Edema Control Wound #1 Right,Posterior Upper Arm o Other: - compression sleeve Electronic Signature(s) Signed: 05/01/2015 12:13:18 PM By: Regan Lemming BSN, RN Strathmore, Kelliann ([id number]) Signed: 05/01/2015 12:36:46 PM By: [name], FACS Entered By: Regan Lemming on 05/01/2015 12:13:18 [name] ([id number]) -------------------------------------------------------------------------------- Problem List Details Patient Name: JULLISA, GRIGORYAN 05/01/2015 11:45 Date of Service: AM Medical Record [id number] Number: Patient Account Number: [id number] 08-May-1956 (59 y.o. 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 Treating RN: Date of Birth/Sex: Female) Other Clinician: Primary Care Physician: [name] Treating Carmelo Reidel Referring Physician: [name] Physician/Extender: Suella Grove in Treatment:  1 Subjective Chief Complaint Information obtained from Patient Patient presents to the wound care center for a consult due non healing wound. The patient was known to have bilateral lymphedema as a status post mastectomy syndrome now comes with an ulcerated area of her right upper arm which she's had for several weeks. History of Present Illness (HPI) The following HPI elements were documented for the patient's wound: Location: bilateral arm lymphedema with now skin ulceration of the right upper extremity. Quality: Patient reports experiencing a dull pain to affected area(s). Severity: Patient states wound are getting worse. Duration: Patient has had the wound for > 3 months prior to seeking treatment at the wound center Timing: Pain in wound is Intermittent (comes and goes Context: The wound occurred when the patient . Modifying Factors: Consults to this date include: local care and antibiotics as prescribed by the PCP and the ER. Associated Signs and Symptoms:  Patient reports having increase discharge. 59 y.o.  [place] woman with stage IV breast cancer, originally diagnosed in April 2003 and now manifested chiefly by loco-regional nodal disease (neck, chest), without lung, liver, bone, or brain involvement. he has received extensive radiation and chemotherapy after the surgeries. She's always had bilateral lymphedema of her arms but now comes with an ulcerated area on her right upper arm posteriorly. Her medical Oncologist, Dr.Gustav [name], was concerned that what we may be seeing in Arkie's right upper extremity is actually skin spread of her cancer. This needs to be biopsied and she was sending her back to general surgery with that in mind. She placed a referral to the wound clinic . She has been on doxycycline for now. The general surgeon saw her 2 days ago and did a biopsy and this report is pending. In April 2016 she had a right upper arm venous duplex study which showed: No  evidence of deep vein or superficial thrombosis involving the right upper extremity and left subclavian vein. Moderate edema noted throughout the right upper extremity. 05/01/2015 the patient tells me that her surgeon revealed that her biopsy was negative and that there was no pathology found. Other than that she is doing well and she thinks her edema has gone down a bit. NUVIA, HILEMAN ([id number]) Objective Constitutional Pulse regular. Respirations normal and unlabored. Afebrile. Vitals Time Taken: 12:07 PM, Height: 65 in, Weight: 168 lbs, BMI: 28, Temperature: 98.8 F, Pulse: 79 bpm, Respiratory Rate: 16 breaths/min, Blood Pressure: 102/70 mmHg. Eyes Nonicteric. Reactive to light. Ears, Nose, Mouth, and Throat Lips, teeth, and gums WNL.Marland Kitchen Moist mucosa without lesions . Neck supple and nontender. No palpable supraclavicular or cervical adenopathy. Normal sized without goiter. Respiratory WNL. No retractions.. Cardiovascular Pedal Pulses WNL. No clubbing, cyanosis or edema. Chest right axilla and arm show massive lymphedema with large ulcerated area on the posterior upper arm.Marland Kitchen Psychiatric Judgement and insight Intact.. No evidence of depression, anxiety, or agitation.. Integumentary (Hair, Skin) Wound #1 status is Open. Original cause of wound was Gradually Appeared. The wound is located on the Right,Posterior Upper Arm. The wound measures 13.6cm length x 19cm width x 0.2cm depth; 202.947cm^2 area and 40.589cm^3 volume. Assessment Active Problems ICD-10 S41.101A - Unspecified open wound of right upper arm, initial encounter Z85.3 - Personal history of malignant neoplasm of breast I97.2 - Postmastectomy lymphedema syndrome Degidio, Shellby ([id number]) Plan Wound Cleansing: Wound #1 Right,Posterior Upper Arm: Clean wound with Normal Saline. May Shower, gently pat wound dry prior to applying new dressing. Anesthetic: Wound #1 Right,Posterior Upper Arm: Topical Lidocaine 4% cream  applied to wound bed prior to debridement Primary Wound Dressing: Wound #1 Right,Posterior Upper Arm: Aquacel Ag Other: Secondary Dressing: Wound #1 Right,Posterior Upper Arm: ABD and Kerlix/Conform Dressing Change Frequency: Wound #1 Right,Posterior Upper Arm: Change dressing every other day. Follow-up Appointments: Wound #1 Right,Posterior Upper Arm: Return Appointment in 1 week. Edema Control: Wound #1 Right,Posterior Upper Arm: Other: - compression sleeve Since the biopsy report is negative we will continue to treat her with silver alginate couple flecks and a light bandage. She was having some problem with the supplies but we will try and make a call to the company to see what is the problem. Come back and see me next week. Electronic Signature(s) Signed: 05/01/2015 3:04:14 PM By: [name], FACS Previous Signature: 05/01/2015 12:20:24 PM Version By: [name], FACS Entered By: [name] on 05/01/2015 15:04:13 [name] ([id number]) -------------------------------------------------------------------------------- SuperBill Details Patient Name: [name] Date  of Service: 05/01/2015 Medical Record Number: [id number] Patient Account Number: [id number] Date of Birth/Sex: September 30, 1956 (59 y.o. 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

## 2015-05-01 NOTE — Progress Notes (Addendum)
[name] [name] [name] [name] ([id number]) Visit Report for 05/01/2015 Arrival Information Details Patient Name: [name] [name] [name] [name] Date of Service: 05/01/2015 11:45 AM Medical Record Number: [id number] Patient Account Number: [id number] Date of Birth/Sex: 1956/03/23 (59 y.o. Female) Treating RN: Afful, RN, BSN, Velva Harman Primary Care Physician: [name] Other Clinician: Referring Physician: [name] Treating Physician/Extender: Frann Rider in Treatment: 1 Visit Information History Since Last Visit Any new allergies or adverse reactions: No Patient Arrived: Ambulatory Had a fall or experienced change in No Arrival Time: 12:02 activities of daily living that may affect Accompanied By: husband risk of falls: Transfer Assistance: None Signs or symptoms of abuse/neglect since last No Patient Identification Verified: Yes visito Secondary Verification Process Yes Hospitalized since last visit: No Completed: Has Dressing in Place as Prescribed: Yes Patient Requires Transmission-Based No Pain Present Now: No Precautions: Patient Has Alerts: No Electronic Signature(s) Signed: 05/01/2015 12:03:13 PM By: Regan Lemming BSN, RN Entered By: Regan Lemming on 05/01/2015 12:03:12 [name] [name] ([id number]) -------------------------------------------------------------------------------- Clinic Level of Care Assessment Details Patient Name: [name] [name] Date of Service: 05/01/2015 11:45 AM Medical Record Number: [id number] Patient Account Number: [id number] Date of Birth/Sex: June 10, 1956 (58 y.o. Female) Treating RN: Afful, RN, BSN, [place] Primary Care Physician: [name] Other Clinician: Referring Physician: [name] Treating Physician/Extender: Frann Rider in Treatment: 1 Clinic Level of Care Assessment Items TOOL 4 Quantity Score []  - Use when only an EandM is performed on FOLLOW-UP visit 0 ASSESSMENTS - Nursing Assessment / Reassessment X - Reassessment of  Co-morbidities (includes updates in patient status) 1 10 X - Reassessment of Adherence to Treatment Plan 1 5 ASSESSMENTS - Wound and Skin Assessment / Reassessment X - Simple Wound Assessment / Reassessment - one wound 1 5 []  - Complex Wound Assessment / Reassessment - multiple wounds 0 []  - Dermatologic / Skin Assessment (not related to wound area) 0 ASSESSMENTS - Focused Assessment []  - Circumferential Edema Measurements - multi extremities 0 []  - Nutritional Assessment / Counseling / Intervention 0 []  - Lower Extremity Assessment (monofilament, tuning fork, pulses) 0 []  - Peripheral Arterial Disease Assessment (using hand held doppler) 0 ASSESSMENTS - Ostomy and/or Continence Assessment and Care []  - Incontinence Assessment and Management 0 []  - Ostomy Care Assessment and Management (repouching, etc.) 0 PROCESS - Coordination of Care X - Simple Patient / Family Education for ongoing care 1 15 []  - Complex (extensive) Patient / Family Education for ongoing care 0 []  - Staff obtains Programmer, systems, Records, Test Results / Process Orders 0 []  - Staff telephones HHA, Nursing Homes / Clarify orders / etc 0 []  - Routine Transfer to another Facility (non-emergent condition) 0 [name] [name] ([id number]) []  - Routine Hospital Admission (non-emergent condition) 0 []  - New Admissions / Biomedical engineer / Ordering NPWT, Apligraf, etc. 0 []  - Emergency Hospital Admission (emergent condition) 0 []  - Simple Discharge Coordination 0 []  - Complex (extensive) Discharge Coordination 0 PROCESS - Special Needs []  - Pediatric / Minor Patient Management 0 []  - Isolation Patient Management 0 []  - Hearing / Language / Visual special needs 0 []  - Assessment of Community assistance (transportation, D/C planning, etc.) 0 []  - Additional assistance / Altered mentation 0 []  - Support Surface(s) Assessment (bed, cushion, seat, etc.) 0 INTERVENTIONS - Wound Cleansing / Measurement X - Simple Wound Cleansing -  one wound 1 5 []  - Complex Wound Cleansing - multiple wounds 0 X - Wound Imaging (photographs - any number of wounds) 1 5 []  - Wound Tracing (instead of photographs) 0 []  -  Simple Wound Measurement - one wound 0 []  - Complex Wound Measurement - multiple wounds 0 INTERVENTIONS - Wound Dressings X - Small Wound Dressing one or multiple wounds 1 10 []  - Medium Wound Dressing one or multiple wounds 0 []  - Large Wound Dressing one or multiple wounds 0 []  - Application of Medications - topical 0 []  - Application of Medications - injection 0 INTERVENTIONS - Miscellaneous []  - External ear exam 0 [name] [name] ([id number]) []  - Specimen Collection (cultures, biopsies, blood, body fluids, etc.) 0 []  - Specimen(s) / Culture(s) sent or taken to Lab for analysis 0 []  - Patient Transfer (multiple staff / Harrel Lemon Lift / Similar devices) 0 []  - Simple Staple / Suture removal (25 or less) 0 []  - Complex Staple / Suture removal (26 or more) 0 []  - Hypo / Hyperglycemic Management (close monitor of Blood Glucose) 0 []  - Ankle / Brachial Index (ABI) - do not check if billed separately 0 X - Vital Signs 1 5 Has the patient been seen at the hospital within the last three years: Yes Total Score: 60 Level Of Care: New/Established - Level 2 Electronic Signature(s) Signed: 05/01/2015 12:14:05 PM By: Regan Lemming BSN, RN Entered By: Regan Lemming on 05/01/2015 12:14:04 [name] [name] ([id number]) -------------------------------------------------------------------------------- Encounter Discharge Information Details Patient Name: [name] [name] Date of Service: 05/01/2015 11:45 AM Medical Record Number: [id number] Patient Account Number: [id number] Date of Birth/Sex: 06-Apr-1956 (58 y.o. Female) Treating RN: Baruch Gouty, RN, BSN, Velva Harman Primary Care Physician: [name] Other Clinician: Referring Physician: [name] Treating Physician/Extender: Frann Rider in Treatment: 1 Encounter Discharge  Information Items Discharge Pain Level: 0 Discharge Condition: Stable Ambulatory Status: Ambulatory Discharge Destination: Home Transportation: Private Auto Accompanied By: husband Schedule Follow-up Appointment: No Medication Reconciliation completed and provided to Patient/Care No Harjit Leider: Provided on Clinical Summary of Care: 05/01/2015 Form Type Recipient Paper Patient MF Electronic Signature(s) Signed: 05/01/2015 12:27:53 PM By: Ruthine Dose Previous Signature: 05/01/2015 12:14:52 PM Version By: Regan Lemming BSN, RN Entered By: Ruthine Dose on 05/01/2015 12:27:53 [name] [name] ([id number]) -------------------------------------------------------------------------------- Lower Extremity Assessment Details Patient Name: [name] [name] Date of Service: 05/01/2015 11:45 AM Medical Record Number: [id number] Patient Account Number: [id number] Date of Birth/Sex: [birth year] (58 y.o. Female) Treating RN: Afful, RN, BSN, Velva Harman Primary Care Physician: [name] Other Clinician: Referring Physician: [name] Treating Physician/Extender: Frann Rider in Treatment: 1 Electronic Signature(s) Signed: 05/01/2015 12:08:12 PM By: Regan Lemming BSN, RN Entered By: Regan Lemming on 05/01/2015 12:08:12 [name] [name] ([id number]) -------------------------------------------------------------------------------- Multi Wound Chart Details Patient Name: [name] [name] Date of Service: 05/01/2015 11:45 AM Medical Record Number: [id number] Patient Account Number: [id number] Date of Birth/Sex: 1956-07-16 (58 y.o. Female) Treating RN: Baruch Gouty, RN, BSN, Velva Harman Primary Care Physician: [name] Other Clinician: Referring Physician: [name] Treating Physician/Extender: Frann Rider in Treatment: 1 Vital Signs Height(in): 65 Pulse(bpm): 79 Weight(lbs): 168 Blood Pressure 102/70 (mmHg): Body Mass Index(BMI): 28 Temperature(F): 98.8 Respiratory  Rate 16 (breaths/min): Photos: [1:No Photos] [N/A:N/A] Wound Location: [1:Right, Posterior Upper Arm] [N/A:N/A] Wounding Event: [1:Gradually Appeared] [N/A:N/A] Primary Etiology: [1:Lymphedema] [N/A:N/A] Date Acquired: [1:03/23/2015] [N/A:N/A] Weeks of Treatment: [1:1] [N/A:N/A] Wound Status: [1:Open] [N/A:N/A] Measurements L x W x D 13x14x0.2 [N/A:N/A] (cm) Area (cm) : [1:142.942] [N/A:N/A] Volume (cm) : [1:28.588] [N/A:N/A] % Reduction in Area: [1:10.10%] [N/A:N/A] % Reduction in Volume: 10.10% [N/A:N/A] Classification: [1:Partial Thickness] [N/A:N/A] Periwound Skin Texture: No Abnormalities Noted [N/A:N/A] Periwound Skin [1:No Abnormalities Noted] [N/A:N/A] Moisture: Periwound Skin Color: No Abnormalities Noted [N/A:N/A] Tenderness on [1:No] [N/A:N/A] Treatment Notes  Electronic Signature(s) Signed: 05/01/2015 12:12:47 PM By: Regan Lemming BSN, RN Entered By: Regan Lemming on 05/01/2015 12:12:47 [name] [name] [name] [name] ([id number]) [name] [name] [name] [name] ([id number]) -------------------------------------------------------------------------------- Multi-Disciplinary Care Plan Details Patient Name: [name] [name] Date of Service: 05/01/2015 11:45 AM Medical Record Number: [id number] Patient Account Number: [id number] Date of Birth/Sex: 1956/08/08 (58 y.o. Female) Treating RN: Afful, RN, BSN, Velva Harman Primary Care Physician: [name] Other Clinician: Referring Physician: [name] Treating Physician/Extender: Frann Rider in Treatment: 1 Active Inactive Malignancy/Atypical Etiology Nursing Diagnoses: Knowledge deficit related to disease process and management of atypical ulcer etiology Goals: Patient/caregiver will verbalize understanding of disease process and disease management of atypical ulcer etiology Date Initiated: 04/24/2015 Goal Status: Active Interventions: Provide education on atypical ulcer etiologies Notes: Orientation to the Wound Care Program Nursing  Diagnoses: Knowledge deficit related to the wound healing center program Goals: Patient/caregiver will verbalize understanding of the [place] Program Date Initiated: 04/24/2015 Goal Status: Active Interventions: Provide education on orientation to the wound center Notes: Wound/Skin Impairment Nursing Diagnoses: Impaired tissue integrity Goals: Ulcer/skin breakdown will have a volume reduction of 30% by week 4 [name] [name] [name] [name] ([id number]) Date Initiated: 04/24/2015 Goal Status: Active Interventions: Assess ulceration(s) every visit Notes: Electronic Signature(s) Signed: 05/01/2015 12:12:37 PM By: Regan Lemming BSN, RN Entered By: Regan Lemming on 05/01/2015 12:12:37 [name] [name] ([id number]) -------------------------------------------------------------------------------- Pain Assessment Details Patient Name: [name] [name] Date of Service: 05/01/2015 11:45 AM Medical Record Number: [id number] Patient Account Number: [id number] Date of Birth/Sex: [birth year] (59 y.o. Female) Treating RN: Baruch Gouty, RN, BSN, Velva Harman Primary Care Physician: [name] Other Clinician: Referring Physician: [name] Treating Physician/Extender: Frann Rider in Treatment: 1 Active Problems Location of Pain Severity and Description of Pain Patient Has Paino No Site Locations Pain Management and Medication Current Pain Management: Electronic Signature(s) Signed: 05/01/2015 12:07:15 PM By: Regan Lemming BSN, RN Entered By: Regan Lemming on 05/01/2015 12:07:15 [name] [name] ([id number]) -------------------------------------------------------------------------------- Patient/Caregiver Education Details Patient Name: [name] [name] Date of Service: 05/01/2015 11:45 AM Medical Record Number: [id number] Patient Account Number: [id number] Date of Birth/Gender: [birth year] (58 y.o. Female) Treating RN: Baruch Gouty, RN, BSN, Velva Harman Primary Care Physician: [name] Other Clinician: Referring  Physician: [name] Treating Physician/Extender: Frann Rider in Treatment: 1 Education Assessment Education Provided To: Patient Education Topics Provided Basic Hygiene: Methods: Explain/Verbal Responses: State content correctly Wound/Skin Impairment: Methods: Explain/Verbal Responses: State content correctly Electronic Signature(s) Signed: 05/01/2015 12:15:04 PM By: Regan Lemming BSN, RN Entered By: Regan Lemming on 05/01/2015 12:15:04 [name] [name] ([id number]) -------------------------------------------------------------------------------- Wound Assessment Details Patient Name: [name] [name] Date of Service: 05/01/2015 11:45 AM Medical Record Number: [id number] Patient Account Number: [id number] Date of Birth/Sex: 22-Sep-1956 (58 y.o. Female) Treating RN: Afful, RN, BSN, [place] Primary Care Physician: [name] Other Clinician: Referring Physician: [name] Treating Physician/Extender: Frann Rider in Treatment: 1 Wound Status Wound Number: 1 Primary Etiology: Lymphedema Wound Location: Right, Posterior Upper Arm Wound Status: Open Wounding Event: Gradually Appeared Date Acquired: 03/23/2015 Weeks Of Treatment: 1 Clustered Wound: No Photos Photo Uploaded By: Regan Lemming on 05/01/2015 15:13:40 Wound Measurements Length: (cm) 13.6 Width: (cm) 19 Depth: (cm) 0.2 Area: (cm) 202.947 Volume: (cm) 40.589 % Reduction in Area: -27.6% % Reduction in Volume: -27.6% Wound Description Classification: Partial Thickness Periwound Skin Texture Texture Color No Abnormalities Noted: No No Abnormalities Noted: No Moisture No Abnormalities Noted: No Treatment Notes Wound #1 (Right, Posterior Upper Arm) 1. Cleansed with: Fitzner, [name] ([id number]) Clean wound with Normal Saline 4. Dressing Applied: Aquacel Ag 5. Secondary Dressing Applied ABD and  Kerlix/Conform 7. Secured with Tape Notes Carboflex applied for odour control. Fish net  stocking applied to help keep wound in place. Electronic Signature(s) Signed: 05/01/2015 2:01:00 PM By: Regan Lemming BSN, RN Entered By: Regan Lemming on 05/01/2015 12:20:53 [name] [name] ([id number]) -------------------------------------------------------------------------------- Vitals Details Patient Name: [name] [name] Date of Service: 05/01/2015 11:45 AM Medical Record Number: [id number] Patient Account Number: [id number] Date of Birth/Sex: 02-21-1956 (58 y.o. Female) Treating RN: Afful, RN, BSN, [place] Primary Care Physician: [name] Other Clinician: Referring Physician: [name] Treating Physician/Extender: Frann Rider in Treatment: 1 Vital Signs Time Taken: 12:07 Temperature (F): 98.8 Height (in): 65 Pulse (bpm): 79 Weight (lbs): 168 Respiratory Rate (breaths/min): 16 Body Mass Index (BMI): 28 Blood Pressure (mmHg): 102/70 Reference Range: 80 - 120 mg / dl Electronic Signature(s) Signed: 05/01/2015 12:08:04 PM By: Regan Lemming BSN, RN Entered By: Regan Lemming on 05/01/2015 12:08:04

## 2015-05-04 ENCOUNTER — Telehealth: Payer: Self-pay | Admitting: *Deleted

## 2015-05-04 ENCOUNTER — Other Ambulatory Visit: Payer: Self-pay | Admitting: *Deleted

## 2015-05-04 ENCOUNTER — Other Ambulatory Visit (HOSPITAL_BASED_OUTPATIENT_CLINIC_OR_DEPARTMENT_OTHER): Payer: Commercial Managed Care - PPO

## 2015-05-04 ENCOUNTER — Telehealth: Payer: Self-pay | Admitting: Oncology

## 2015-05-04 ENCOUNTER — Ambulatory Visit (HOSPITAL_BASED_OUTPATIENT_CLINIC_OR_DEPARTMENT_OTHER): Payer: Commercial Managed Care - PPO

## 2015-05-04 ENCOUNTER — Ambulatory Visit (HOSPITAL_BASED_OUTPATIENT_CLINIC_OR_DEPARTMENT_OTHER): Payer: Commercial Managed Care - PPO | Admitting: Oncology

## 2015-05-04 VITALS — BP 111/77 | HR 87 | Temp 98.4°F | Resp 18 | Ht 64.0 in | Wt 166.9 lb

## 2015-05-04 DIAGNOSIS — Z923 Personal history of irradiation: Secondary | ICD-10-CM | POA: Diagnosis not present

## 2015-05-04 DIAGNOSIS — C50211 Malignant neoplasm of upper-inner quadrant of right female breast: Secondary | ICD-10-CM

## 2015-05-04 DIAGNOSIS — Z9221 Personal history of antineoplastic chemotherapy: Secondary | ICD-10-CM

## 2015-05-04 DIAGNOSIS — Z9013 Acquired absence of bilateral breasts and nipples: Secondary | ICD-10-CM

## 2015-05-04 DIAGNOSIS — C778 Secondary and unspecified malignant neoplasm of lymph nodes of multiple regions: Secondary | ICD-10-CM

## 2015-05-04 DIAGNOSIS — C50911 Malignant neoplasm of unspecified site of right female breast: Secondary | ICD-10-CM

## 2015-05-04 DIAGNOSIS — E8989 Other postprocedural endocrine and metabolic complications and disorders: Secondary | ICD-10-CM

## 2015-05-04 DIAGNOSIS — Z95828 Presence of other vascular implants and grafts: Secondary | ICD-10-CM

## 2015-05-04 DIAGNOSIS — I890 Lymphedema, not elsewhere classified: Secondary | ICD-10-CM | POA: Diagnosis not present

## 2015-05-04 DIAGNOSIS — Z86000 Personal history of in-situ neoplasm of breast: Secondary | ICD-10-CM

## 2015-05-04 DIAGNOSIS — Z171 Estrogen receptor negative status [ER-]: Secondary | ICD-10-CM

## 2015-05-04 DIAGNOSIS — Z86718 Personal history of other venous thrombosis and embolism: Secondary | ICD-10-CM

## 2015-05-04 LAB — COMPREHENSIVE METABOLIC PANEL (CC13)
ALT: 15 U/L (ref 0–55)
AST: 24 U/L (ref 5–34)
Albumin: 2.9 g/dL — ABNORMAL LOW (ref 3.5–5.0)
Alkaline Phosphatase: 129 U/L (ref 40–150)
Anion Gap: 10 mEq/L (ref 3–11)
BUN: 19.1 mg/dL (ref 7.0–26.0)
CO2: 30 mEq/L — ABNORMAL HIGH (ref 22–29)
Calcium: 9.4 mg/dL (ref 8.4–10.4)
Chloride: 100 mEq/L (ref 98–109)
Creatinine: 0.8 mg/dL (ref 0.6–1.1)
EGFR: >90 mL/min/{1.73_m2} (ref >90)
Glucose: 115 mg/dl (ref 70–140)
Potassium: 3.6 mEq/L (ref 3.5–5.1)
Sodium: 140 mEq/L (ref 136–145)
Total Bilirubin: 0.23 mg/dL (ref 0.20–1.20)
Total Protein: 6.9 g/dL (ref 6.4–8.3)

## 2015-05-04 LAB — CBC WITH DIFFERENTIAL/PLATELET
BASO%: 0.9 % (ref 0.0–2.0)
Basophils Absolute: 0.1 10*3/uL (ref 0.0–0.1)
EOS%: 1.8 % (ref 0.0–7.0)
Eosinophils Absolute: 0.2 10*3/uL (ref 0.0–0.5)
HCT: 32.3 % — ABNORMAL LOW (ref 34.8–46.6)
HGB: 10.5 g/dL — ABNORMAL LOW (ref 11.6–15.9)
LYMPH%: 22.7 % (ref 14.0–49.7)
MCH: 27.7 pg (ref 25.1–34.0)
MCHC: 32.6 g/dL (ref 31.5–36.0)
MCV: 85.1 fL (ref 79.5–101.0)
MONO#: 0.8 10*3/uL (ref 0.1–0.9)
MONO%: 8.4 % (ref 0.0–14.0)
NEUT#: 6.1 10*3/uL (ref 1.5–6.5)
NEUT%: 66.2 % (ref 38.4–76.8)
Platelets: 322 10*3/uL (ref 145–400)
RBC: 3.8 10*6/uL (ref 3.70–5.45)
RDW: 12.4 % (ref 11.2–14.5)
WBC: 9.3 10*3/uL (ref 3.9–10.3)
lymph#: 2.1 10*3/uL (ref 0.9–3.3)

## 2015-05-04 MED ORDER — HEPARIN SOD (PORK) LOCK FLUSH 100 UNIT/ML IV SOLN
500.0000 [IU] | Freq: Once | INTRAVENOUS | Status: AC
  Administered 2015-05-04: 500 [IU] via INTRAVENOUS
  Filled 2015-05-04: qty 5

## 2015-05-04 MED ORDER — OXYCODONE HCL 15 MG PO TABS: 15.0000 mg | ORAL_TABLET | Freq: Two times a day (BID) | ORAL | Status: DC | PRN

## 2015-05-04 MED ORDER — SODIUM CHLORIDE 0.9 % IJ SOLN
10.0000 mL | INTRAMUSCULAR | Status: DC | PRN
  Administered 2015-05-04: 10 mL via INTRAVENOUS
  Filled 2015-05-04: qty 10

## 2015-05-04 MED ORDER — DOXYCYCLINE HYCLATE 100 MG PO CAPS: 100.0000 mg | ORAL_CAPSULE | Freq: Two times a day (BID) | ORAL | Status: DC

## 2015-05-04 MED ORDER — OXYMORPHONE HCL 10 MG PO TABS: 10.0000 mg | ORAL_TABLET | Freq: Two times a day (BID) | ORAL | Status: DC | PRN

## 2015-05-04 NOTE — Patient Instructions (Signed)

## 2015-05-04 NOTE — Telephone Encounter (Signed)
Pt is scheduled to see [name] today- will follow up with contrast concern. Thank you

## 2015-05-04 NOTE — Progress Notes (Signed)
[name]  [phone] [phone]) [phone]  OFFICE PROGRESS NOTE   ID: [name]   DOB: 1956-02-10  MR#: [id number]  LDJ#:[id number]   PCP: [name] Hey, MD SU: [name]. [name], [name]/[name], [name]/[name] Hoxworth MD RAD QZE:[id number] [name], [name] OTHER:  [name] Bickers, MD, [name] Fudge MD  CHIEF COMPLAINT:  Stage IV Breast Cancer  CURRENT TREATMENT: doxycycline    BREAST CANCER HISTORY: From the original intake note:  The patient originally presented in May 2003 when she noticed a lump in the upper inner quadrant of her right breast in September 2003.  She sought attention and had a mammogram which showed an obvious carcinoma in the upper inner quadrant of the right breast, approximately 2 cm.  There were some enlarged lymph nodes in the axilla and an FNA done showed those consistent with malignant cells, most likely an invasive ductal carcinoma.  At that point she was unsure of what to do and was referred by Dr. Marylene [name] for a discussion of her treatment options.  By biopsy, it was ER/PR negative and HER-2 was 3+.  DNA index was 1.42.  We reiterated that it was most important to have her disease surgically addressed at that point and had recommended lumpectomy and axillary nodes to be addressed with which she followed through and on 04-03-02, Dr. Annamaria [name] performed a right partial mastectomy and a right axillary lymph node dissection.  Final pathology revealed a 2.4 cm. high grade, Grade III invasive ductal carcinoma with an adjacent .8 cm. also high grade invasive ductal carcinoma which was felt to represent an intramammary metastasis rather than a second primary.  A smaller mass was just medial to the larger mass.  The smaller mass was associated with high grade DCIS component.  There was no definite lymphovascular invasion identified.  However, one of fourteen axillary lymph nodes did contain a 1.5 cm. metastatic deposit.  Postoperatively she did  very well.  We reiterated the fact that she did need adjuvant chemotherapy, however, she refused and decided that she would pursue radiation.  She received radiation and completed that on 08-14-02. Her subsequent history is as detailed below.   INTERVAL HISTORY: [name] returns today for followup of her breast cancer accompanied by her husband. Since her last visit with me she saw Dr. Excell [name] and had a biopsy of the underside of the right upper extremity 04/23/2015. The pathology from that procedure ([id number]) was read as dermatofibroma. I have discussed that with the pathologist and once they were unaware of the large area of involvement in the right upper extremity they felt this was likely not the correct diagnosis. They're doing deeper cuts and special stains specifically to rule out adenocarcinoma.  Since the last visit here also [name] went to the wound clinical [name]. She is very pleased with the help she is receiving there. She feels her wound already looks better and does not smell any longer.   REVIEW OF SYSTEMS:  she still has severe burning pain over the right upper extremity, particularly at bedtime. She does take gabapentin which helps some. She is using oxymorphone 10 mg, with good pain control. She usually takes his at bedtime and it allows her to sleep. So far it has not constipated her. There have been no unusual headaches, visual changes, nausea, or vomiting. She denies cough, phlegm production or pleurisy. A detailed review of systems today was otherwise stable.   PAST MEDICAL HISTORY: Past Medical History  Diagnosis Date  . DVT (  deep venous thrombosis) 10/07/2011  . Breast cancer   . Hypertension   . Rheumatic fever     PAST SURGICAL HISTORY: Past Surgical History  Procedure Laterality Date  . Mastectomy      bilateral  . Breast surgery    . Port a cath revision      FAMILY HISTORY Family History  Problem Relation Age of Onset  . Pancreatic cancer  Mother   . Kidney cancer Sister 45  . Breast cancer Sister 85  . Breast cancer Other 34  The patient's father died in his 43P  from complications of alcohol abuse. The patient's mother died in her 20s from pancreatic cancer. The patient has 6 brothers, 2 sisters. Both her sisters have had breast cancer, both diagnosed after the age of 76. The patient has not had genetic testing so far.   GYNECOLOGIC HISTORY: Menarche age 43; first live birth age 71. She is GXP4. She is not sure when she stopped having periods. She never used hormone replacement.  SOCIAL HISTORY: The patient has worked in the past as a Psychologist, counselling. At home in addition to the patient are her husband [name], originally from [place], who works as a Administrator.  Also living in the home is her son [name] and her daughter [name] (she is studying to be a Marine scientist).  The other 2 children are [name], who has a college degree in psychology and works as a Probation officer; and [name], who lives in [place] and works as a Higher education careers adviser.   ADVANCED DIRECTIVES: Not in place  HEALTH MAINTENANCE: History  Substance Use Topics  . Smoking status: Light Tobacco Smoker  . Smokeless tobacco: Never Used  . Alcohol Use: Yes     Comment: Rarely     Colonoscopy:  Not on file  PAP:  Not on file  Bone density:  Not on file  Lipid panel:  Not on file    Allergies  Allergen Reactions  . Tramadol Nausea Only  . Hydrocodone-Acetaminophen Itching and Rash    Current Outpatient Prescriptions  Medication Sig Dispense Refill  . alprazolam (XANAX) 2 MG tablet Take 2 mg by mouth daily.     . Ascorbic Acid (VITAMIN C) 100 MG tablet Take 100 mg by mouth daily.      . calcium-vitamin D (OSCAL WITH D) 500-200 MG-UNIT per tablet Take 1 tablet by mouth daily.      . cephALEXin (KEFLEX) 500 MG capsule Take 1 capsule (500 mg total) by mouth 2 (two) times daily. (Patient not taking: Reported on 04/19/2015) 20 capsule 0  . doxycycline (VIBRAMYCIN) 100 MG  capsule Take 1 capsule (100 mg total) by mouth 2 (two) times daily. One po bid x 7 days 14 capsule 0  . gabapentin (NEURONTIN) 300 MG capsule Take 1 capsule (300 mg total) by mouth at bedtime. 90 capsule 4  . Multiple Vitamin (MULTIVITAMIN) capsule Take 1 capsule by mouth daily.      Marland Kitchen oxymorphone (OPANA) 10 MG tablet Take 1 tablet (10 mg total) by mouth 2 (two) times daily as needed for pain. 30 tablet 0  . triamterene-hydrochlorothiazide (DYAZIDE) 37.5-25 MG per capsule TAKE 1 EACH (1 CAPSULE TOTAL) BY MOUTH EVERY MORNING. 30 capsule 2   No current facility-administered medications for this visit.   Facility-Administered Medications Ordered in Other Visits  Medication Dose Route Frequency Provider Last Rate Last Dose  . sodium chloride 0.9 % injection 10 mL  10 mL Intravenous PRN Chauncey Cruel, MD  10 mL at 03/04/14 1421  . sodium chloride 0.9 % injection 10 mL  10 mL Intravenous PRN Chauncey Cruel, MD   10 mL at 05/04/15 1518    OBJECTIVE: Middle-aged African American woman   Filed Vitals:   05/04/15 1540  BP: 111/77  Pulse: 87  Temp: 98.4 F (36.9 C)  Resp: 18     Body mass index is 28.63 kg/(m^2).    ECOG FS: 1 Filed Weights   05/04/15 1540  Weight: 166 lb 14.4 oz (75.705 kg)    Sclerae unicteric, EOMs intact Oropharynx clear, dentition in good repair No cervical or supraclavicular adenopathy Lungs no rales or rhonchi Heart regular rate and rhythm Abd soft, nontender, positive bowel sounds MSK no focal spinal tenderness, grade 3 right upper extremity lymphedema, with induration extending over the right shoulder as well Neuro: nonfocal, well oriented, appropriate affect Breasts:     photo right upper extremity 04/20/2015    LAB RESULTS:. Results for LEILA, SCHUFF (MRN [id number]) as of 03/16/2015 15:04  Ref. Range 05/17/2012 09:49 10/05/2012 14:28 05/29/2014 13:17 08/21/2014 13:43 12/08/2014 12:29  CA 27.29 Latest Ref Range: 0-39 U/mL 54 (H) 37 '29 28 30   ' Lab  Results  Component Value Date   WBC 9.3 05/04/2015   NEUTROABS 6.1 05/04/2015   HGB 10.5* 05/04/2015   HCT 32.3* 05/04/2015   MCV 85.1 05/04/2015   PLT 322 05/04/2015      Chemistry      Component Value Date/Time   NA 140 05/04/2015 1510   NA 140 08/14/2014 0100   K 3.6 05/04/2015 1510   K 3.8 08/14/2014 0100   CL 100 08/14/2014 0100   CL 101 03/07/2013 1411   CO2 30* 05/04/2015 1510   CO2 28 08/14/2014 0100   BUN 19.1 05/04/2015 1510   BUN 16 08/14/2014 0100   CREATININE 0.8 05/04/2015 1510   CREATININE 0.78 08/14/2014 0100      Component Value Date/Time   CALCIUM 9.4 05/04/2015 1510   CALCIUM 9.5 08/14/2014 0100   ALKPHOS 129 05/04/2015 1510   ALKPHOS 159* 08/14/2014 0100   AST 24 05/04/2015 1510   AST 47* 08/14/2014 0100   ALT 15 05/04/2015 1510   ALT 35 08/14/2014 0100   BILITOT 0.23 05/04/2015 1510   BILITOT <0.2* 08/14/2014 0100       STUDIES: No results found.  ASSESSMENT:  59 y.o. [place] woman with stage IV breast cancer manifested chiefly by loco-regional nodal disease (neck, chest), without lung, liver, bone, or brain involvement.  (1) Status post right lumpectomy and sentinel lymph node sampling 03/04/2002 for 2 separate foci of invasive ductal carcinoma, mpT2 pN1 or stage IIB, both foci grade 3, both estrogen and progesterone receptor positive, both HercepTest 3+, Mib-1 56%  (2) Reexcision for margins 05/27/2002 showed no residual cancer in the breast.  (3) The patient refused adjuvant systemic therapy.  (4) Adjuvant radiation treatment completed 08/14/2002.  (5) recurrence in the right breast in 02/2004 showing a morphologically different tumor, again grade 3, again estrogen and progesterone receptor negative, with an MIB-1 of 14% and Herceptest 3+.  (5) Between 03/2004 and 07/2004 she received dose dense Doxorubicin/Cyclophosphamide x 4 given with trastuzumab, followed by weekly Docetaxel x 8, again given with trastuzumab.  (6) Right  mastectomy 07/13/2004 showed scattered microscopic foci of residual disease over an area  greater than 5 cm. Margins were negative.  (7) Postoperative Docetaxel continued until 09/2004.  (8) Trastuzumab (Herceptin) given 08/2004 through 01/2012 with some brief  interruptions.  (9) Isolated right cervical nodal recurrence 10/2005, treated with radiation to the right supraclavicular area (total 41.5 gray) completed 12/29/2005.  (10) Navelbine given together with Herceptin  11/2005 through 03/2006.  (9) Left mastectomy 02/13/2006 for ductal carcinoma in situ, grade 2, estrogen and progesterone receptor negative, with negative margins; 0 of 3 lymph nodes involved  (10) treated with Lapatinib and Capecitabine before 10/2009, for an unclear duration and with unclear results (cannot locate data on chart review).  (11) Status post right supraclavicular lymph node biopsy 09/2010 again positive for an invasive ductal carcinoma, estrogen and progesterone receptor negative, HER-2 positive by CISH with a ratio 4.25.  (12) Navelbine given together with Herceptin between 05/2011 and 11/2011.  (13) Carboplatin/ Gemcitabine/ Herceptin given for 2 cycles, in 12/2011 and 01/2012.  (14) TDM-1 (Kadcyla) started 02/2012. Last dose 10/02/2013 after which the patient discontinued treatment at her own discretion. Most recent echo on December 2014 showed a well preserved ejection fraction.  (15) Deep vein thrombosis of the right upper extremity documented 04/20/2011.  She completed anticoagulant therapy with Coumadin on 03/25/2013.  (16) Chronic right upper extremity lymphedema, not responsive to aggressive PT  (a) biopsy of denuded area 04/23/2015 read as dermatofibroma (discordant)  (17) Right chest port-a-cath removal due to infection on 01/28/2013. Left chest Port-A-Cath placed on 04/08/2013; being flushed every 6 weeks   PLAN: We are doing deeper cuts and special stains on the biopsy obtained 10 days ago  from Myishia's right arm. If they have insufficient tissue we may need to obtain a second biopsy. We want to have a firm diagnosis and make sure we are not dealing with an unusual form of cancer spread as opposed to secondary consequences from lymphedema.  I am also obtaining an MRI of the right shoulder and right upper extremity July 5. She is going to see me on July 8 to discuss all those results. If we are dealing with cancer, even though she refuses chemotherapy, she may agree to further anti-HER-2 treatment.  She is obtaining significant relief from participating in the [place] wound clinic and she already has a return appointment there later this week.  Today I refilled her oxymorphone and doxycycline prescriptions. She knows to call for any problems that may develop before her next visit here.   Chauncey Cruel, MD  05/04/2015 4:25 PM

## 2015-05-04 NOTE — Telephone Encounter (Signed)
Gave avs & calendar for July °

## 2015-05-04 NOTE — Addendum Note (Signed)
Addended by: Chauncey Cruel on: 05/04/2015 05:12 PM   Modules accepted: Orders

## 2015-05-04 NOTE — Telephone Encounter (Signed)
[name] with [facility] Scheduling called in reference to "MRI for 05-12-2015.  Patient reports allergy to contrast medium and will need 13 hr. Prep.  Patient reports she itches and in the past has taken two benadryl.  Multiple CT's but no Previous MRI found.  Patient insist that she is allergic to MRI contrast.  Will notify [name]

## 2015-05-05 ENCOUNTER — Ambulatory Visit: Payer: Commercial Managed Care - PPO

## 2015-05-05 ENCOUNTER — Other Ambulatory Visit: Payer: Self-pay | Admitting: Oncology

## 2015-05-07 ENCOUNTER — Ambulatory Visit: Payer: Commercial Managed Care - PPO | Admitting: Oncology

## 2015-05-08 ENCOUNTER — Encounter: Payer: Commercial Managed Care - PPO | Attending: Surgery | Admitting: Surgery

## 2015-05-08 ENCOUNTER — Ambulatory Visit: Payer: Commercial Managed Care - PPO | Admitting: Physical Therapy

## 2015-05-08 DIAGNOSIS — C50919 Malignant neoplasm of unspecified site of unspecified female breast: Secondary | ICD-10-CM | POA: Diagnosis not present

## 2015-05-08 DIAGNOSIS — I972 Postmastectomy lymphedema syndrome: Secondary | ICD-10-CM | POA: Insufficient documentation

## 2015-05-08 DIAGNOSIS — S41101A Unspecified open wound of right upper arm, initial encounter: Secondary | ICD-10-CM | POA: Insufficient documentation

## 2015-05-08 DIAGNOSIS — Z853 Personal history of malignant neoplasm of breast: Secondary | ICD-10-CM | POA: Diagnosis not present

## 2015-05-08 DIAGNOSIS — X58XXXA Exposure to other specified factors, initial encounter: Secondary | ICD-10-CM | POA: Diagnosis not present

## 2015-05-08 NOTE — Progress Notes (Signed)
[name], [name] ([id number]) Visit Report for 05/08/2015 Arrival Information Details Patient Name: [name] Date of Service: 05/08/2015 11:45 AM Medical Record Number: [id number] Patient Account Number: [id number] Date of Birth/Sex: 1956/04/28 (59 y.o. Female) Treating RN: Afful, RN, BSN, Velva Harman Primary Care Physician: [name] Other Clinician: Referring Physician: [name] Treating Physician/Extender: Frann Rider in Treatment: 2 Visit Information History Since Last Visit Any new allergies or adverse reactions: No Patient Arrived: Ambulatory Had a fall or experienced change in No Arrival Time: 11:54 activities of daily living that may affect Accompanied By: husband risk of falls: Transfer Assistance: None Signs or symptoms of abuse/neglect since last No Patient Identification Verified: Yes visito Secondary Verification Process Yes Hospitalized since last visit: No Completed: Has Dressing in Place as Prescribed: Yes Patient Requires Transmission-Based No Pain Present Now: No Precautions: Patient Has Alerts: No Electronic Signature(s) Signed: 05/08/2015 2:48:51 PM By: Regan Lemming BSN, RN Entered By: Regan Lemming on 05/08/2015 11:55:11 [name] ([id number]) -------------------------------------------------------------------------------- Encounter Discharge Information Details Patient Name: [name] Date of Service: 05/08/2015 11:45 AM Medical Record Number: [id number] Patient Account Number: [id number] Date of Birth/Sex: [birth year] (58 y.o. Female) Treating RN: Baruch Gouty, RN, BSN, Velva Harman Primary Care Physician: [name] Other Clinician: Referring Physician: [name] Treating Physician/Extender: Frann Rider in Treatment: 2 Encounter Discharge Information Items Discharge Pain Level: 0 Discharge Condition: Stable Ambulatory Status: Ambulatory Discharge Destination: Home Private Transportation: Auto Accompanied By:  SELF Schedule Follow-up Appointment: No Medication Reconciliation completed and No provided to Patient/Care Pema Thomure: Clinical Summary of Care: Electronic Signature(s) Signed: 05/08/2015 2:48:51 PM By: Regan Lemming BSN, RN Entered By: Regan Lemming on 05/08/2015 13:04:07 [name] ([id number]) -------------------------------------------------------------------------------- Lower Extremity Assessment Details Patient Name: [name] Date of Service: 05/08/2015 11:45 AM Medical Record Number: [id number] Patient Account Number: [id number] Date of Birth/Sex: 12/28/1955 (58 y.o. Female) Treating RN: Afful, RN, BSN, Velva Harman Primary Care Physician: [name] Other Clinician: Referring Physician: [name] Treating Physician/Extender: Frann Rider in Treatment: 2 Electronic Signature(s) Signed: 05/08/2015 2:48:51 PM By: Regan Lemming BSN, RN Entered By: Regan Lemming on 05/08/2015 12:00:25 [name] ([id number]) -------------------------------------------------------------------------------- Multi Wound Chart Details Patient Name: [name] Date of Service: 05/08/2015 11:45 AM Medical Record Number: [id number] Patient Account Number: [id number] Date of Birth/Sex: 09-Aug-1956 (58 y.o. Female) Treating RN: Baruch Gouty, RN, BSN, Velva Harman Primary Care Physician: [name] Other Clinician: Referring Physician: [name] Treating Physician/Extender: Frann Rider in Treatment: 2 Vital Signs Height(in): 65 Pulse(bpm): 75 Weight(lbs): 168 Blood Pressure 116/60 (mmHg): Body Mass Index(BMI): 28 Temperature(F): 98.5 Respiratory Rate 16 (breaths/min): Photos: [1:No Photos] [N/A:N/A] Wound Location: [1:Right Upper Arm - Posterior] [N/A:N/A] Wounding Event: [1:Gradually Appeared] [N/A:N/A] Primary Etiology: [1:Lymphedema] [N/A:N/A] Comorbid History: [1:Deep Vein Thrombosis, Hypertension, Received Chemotherapy, Received Radiation] [N/A:N/A] Date Acquired:  [1:03/23/2015] [N/A:N/A] Weeks of Treatment: [1:2] [N/A:N/A] Wound Status: [1:Open] [N/A:N/A] Measurements L x W x D 13.2x19.5x0.2 [N/A:N/A] (cm) Area (cm) : [1:202.161] [N/A:N/A] Volume (cm) : [1:40.432] [N/A:N/A] % Reduction in Area: [1:-27.10%] [N/A:N/A] % Reduction in Volume: -27.10% [N/A:N/A] Classification: [1:Partial Thickness] [N/A:N/A] Exudate Amount: [1:Small] [N/A:N/A] Exudate Type: [1:Serosanguineous] [N/A:N/A] Exudate Color: [1:red, brown] [N/A:N/A] Wound Margin: [1:Distinct, outline attached] [N/A:N/A] Granulation Amount: [1:None Present (0%)] [N/A:N/A] Necrotic Amount: [1:Large (67-100%)] [N/A:N/A] Exposed Structures: [1:Fascia: No Fat: No Tendon: No Muscle: No Joint: No] [N/A:N/A] Bone: No Limited to Skin Breakdown Epithelialization: Medium (34-66%) N/A N/A Periwound Skin Texture: Edema: No N/A N/A Excoriation: No Induration: No Callus: No Crepitus: No Fluctuance: No Friable: No Rash: No Scarring: No Periwound Skin Moist: Yes N/A  N/A Moisture: Maceration: No Dry/Scaly: No Periwound Skin Color: Atrophie Blanche: No N/A N/A Cyanosis: No Ecchymosis: No Erythema: No Hemosiderin Staining: No Mottled: No Pallor: No Rubor: No Tenderness on No N/A N/A Palpation: Wound Preparation: Ulcer Cleansing: N/A N/A Rinsed/Irrigated with Saline Topical Anesthetic Applied: Other: lidocaine 4% Treatment Notes Electronic Signature(s) Signed: 05/08/2015 2:48:51 PM By: Regan Lemming BSN, RN Entered By: Regan Lemming on 05/08/2015 12:25:57 [name] ([id number]) -------------------------------------------------------------------------------- [place] Details Patient Name: [name] Date of Service: 05/08/2015 11:45 AM Medical Record Number: [id number] Patient Account Number: [id number] Date of Birth/Sex: 1956/10/28 (58 y.o. Female) Treating RN: Afful, RN, BSN, Velva Harman Primary Care Physician: [name] Other Clinician: Referring  Physician: [name] Treating Physician/Extender: Frann Rider in Treatment: 2 Active Inactive Malignancy/Atypical Etiology Nursing Diagnoses: Knowledge deficit related to disease process and management of atypical ulcer etiology Goals: Patient/caregiver will verbalize understanding of disease process and disease management of atypical ulcer etiology Date Initiated: 04/24/2015 Goal Status: Active Interventions: Provide education on atypical ulcer etiologies Notes: Orientation to the Wound Care Program Nursing Diagnoses: Knowledge deficit related to the wound healing center program Goals: Patient/caregiver will verbalize understanding of the [facility] Date Initiated: 04/24/2015 Goal Status: Active Interventions: Provide education on orientation to the wound center Notes: Wound/Skin Impairment Nursing Diagnoses: Impaired tissue integrity Goals: Ulcer/skin breakdown will have a volume reduction of 30% by week 4 Kovaleski, Hinda ([id number]) Date Initiated: 04/24/2015 Goal Status: Active Interventions: Assess ulceration(s) every visit Notes: Electronic Signature(s) Signed: 05/08/2015 2:48:51 PM By: Regan Lemming BSN, RN Entered By: Regan Lemming on 05/08/2015 12:25:40 [name] ([id number]) -------------------------------------------------------------------------------- Pain Assessment Details Patient Name: [name] Date of Service: 05/08/2015 11:45 AM Medical Record Number: [id number] Patient Account Number: [id number] Date of Birth/Sex: [birth year] (59 y.o. Female) Treating RN: Baruch Gouty, RN, BSN, Velva Harman Primary Care Physician: [name] Other Clinician: Referring Physician: [name] Treating Physician/Extender: Frann Rider in Treatment: 2 Active Problems Location of Pain Severity and Description of Pain Patient Has Paino No Site Locations Pain Management and Medication Current Pain Management: Electronic  Signature(s) Signed: 05/08/2015 2:48:51 PM By: Regan Lemming BSN, RN Entered By: Regan Lemming on 05/08/2015 11:55:27 [name] ([id number]) -------------------------------------------------------------------------------- Patient/Caregiver Education Details Patient Name: [name] Date of Service: 05/08/2015 11:45 AM Medical Record Number: [id number] Patient Account Number: [id number] Date of Birth/Gender: 07-05-1956 (58 y.o. Female) Treating RN: Baruch Gouty, RN, BSN, Velva Harman Primary Care Physician: [name] Other Clinician: Referring Physician: [name] Treating Physician/Extender: Frann Rider in Treatment: 2 Education Assessment Education Provided To: Patient Education Topics Provided Malignant/Atypical Wounds: Handouts: Malignant/Atypical Wounds Responses: State content correctly Welcome To [facility]: Methods: Explain/Verbal Responses: State content correctly Electronic Signature(s) Signed: 05/08/2015 2:48:51 PM By: Regan Lemming BSN, RN Entered By: Regan Lemming on 05/08/2015 13:04:28 [name] ([id number]) -------------------------------------------------------------------------------- Wound Assessment Details Patient Name: [name] Date of Service: 05/08/2015 11:45 AM Medical Record Number: [id number] Patient Account Number: [id number] Date of Birth/Sex: 1956-07-30 (58 y.o. Female) Treating RN: Afful, RN, BSN, Velva Harman Primary Care Physician: [name] Other Clinician: Referring Physician: [name] Treating Physician/Extender: Frann Rider in Treatment: 2 Wound Status Wound Number: 1 Primary Lymphedema Etiology: Wound Location: Right Upper Arm - Posterior Wound Open Wounding Event: Gradually Appeared Status: Date Acquired: 03/23/2015 Comorbid Deep Vein Thrombosis, Hypertension, Weeks Of Treatment: 2 History: Received Chemotherapy, Received Clustered Wound: No Radiation Photos Photo Uploaded By: Regan Lemming on  05/08/2015 14:47:04 Wound Measurements Length: (cm) 13.2 Width: (cm) 19.5 Depth: (cm) 0.2 Area: (cm) 202.161  Volume: (cm) 40.432 % Reduction in Area: -27.1% % Reduction in Volume: -27.1% Epithelialization: Medium (34-66%) Tunneling: No Undermining: No Wound Description Classification: Partial Thickness Wound Margin: Distinct, outline attached Exudate Amount: Small Exudate Type: Serosanguineous Exudate Color: red, brown Foul Odor After Cleansing: No Wound Bed Granulation Amount: None Present (0%) Exposed Structure Necrotic Amount: Large (67-100%) Fascia Exposed: No Necrotic Quality: Adherent Slough Fat Layer Exposed: No Tendon Exposed: No Pott, Minha ([id number]) Muscle Exposed: No Joint Exposed: No Bone Exposed: No Limited to Skin Breakdown Periwound Skin Texture Texture Color No Abnormalities Noted: No No Abnormalities Noted: No Callus: No Atrophie Blanche: No Crepitus: No Cyanosis: No Excoriation: No Ecchymosis: No Fluctuance: No Erythema: No Friable: No Hemosiderin Staining: No Induration: No Mottled: No Localized Edema: No Pallor: No Rash: No Rubor: No Scarring: No Moisture No Abnormalities Noted: No Dry / Scaly: No Maceration: No Moist: Yes Wound Preparation Ulcer Cleansing: Rinsed/Irrigated with Saline Topical Anesthetic Applied: Other: lidocaine 4%, Treatment Notes Wound #1 (Right, Posterior Upper Arm) 1. Cleansed with: Clean wound with Normal Saline 4. Dressing Applied: Aquacel Ag 5. Secondary Dressing Applied Guaze, ABD and kerlix/Conform Notes Carboflex applied for odour control. Fish net stocking applied to help keep wound in place. Electronic Signature(s) Signed: 05/08/2015 2:48:51 PM By: Regan Lemming BSN, RN Entered By: Regan Lemming on 05/08/2015 12:09:47 [name] ([id number]) -------------------------------------------------------------------------------- Vitals Details Patient Name: [name] Date of Service:  05/08/2015 11:45 AM Medical Record Number: [id number] Patient Account Number: [id number] Date of Birth/Sex: [birth year] (58 y.o. Female) Treating RN: Afful, RN, BSN, [place] Primary Care Physician: [name] Other Clinician: Referring Physician: [name] Treating Physician/Extender: Frann Rider in Treatment: 2 Vital Signs Time Taken: 11:55 Temperature (F): 98.5 Height (in): 65 Pulse (bpm): 75 Weight (lbs): 168 Respiratory Rate (breaths/min): 16 Body Mass Index (BMI): 28 Blood Pressure (mmHg): 116/60 Reference Range: 80 - 120 mg / dl Electronic Signature(s) Signed: 05/08/2015 2:48:51 PM By: Regan Lemming BSN, RN Entered By: Regan Lemming on 05/08/2015 12:00:03

## 2015-05-08 NOTE — Progress Notes (Signed)
MACHELL, WIRTHLIN ([id number]) Visit Report for 05/08/2015 Biopsy Details Patient Name: [name], [name] Date of Service: 05/08/2015 11:45 AM Medical Record Number: [id number] Patient Account Number: [id number] Date of Birth/Sex: 09/26/1956 (59 y.o. Female) Treating RN: Primary Care Physician: [name] Other Clinician: Referring Physician: [name] Treating Physician/Extender: Frann Rider in Treatment: 2 Biopsy Performed for: Wound #1 Right, Posterior Upper Arm Location(s): Wound Margin, Peri-Ulcer Tissue Performed By: Physician Pat Patrick., MD Tissue Punch: No Number of Specimens Taken: 3 Specimen Sent To Pathology: Yes Time-Out Taken: Yes Pain Control: Lidocaine Injectable Lidocaine Percent: 2% Instrument: Blade, Forceps Bleeding: Moderate Hemostasis Achieved: Silver Nitrate Procedural Pain: 1 Post Procedural Pain: 0 Response to Treatment: Procedure was tolerated well Notes Wedge biopsies taken from 3 different areas under local anesthesia. Sent in formalin to pathology which is our [facility] pathology. Electronic Signature(s) Signed: 05/08/2015 12:57:16 PM By: [name], FACS Entered By: [name] on 05/08/2015 12:51:55 [name] ([id number]) -------------------------------------------------------------------------------- Chief Complaint Document Details Patient Name: [name] Date of Service: 05/08/2015 11:45 AM Medical Record Number: [id number] Patient Account Number: [id number] Date of Birth/Sex: 23-Jun-1956 (58 y.o. Female) Treating RN: Primary Care Physician: [name] Other Clinician: Referring Physician: [name] Treating Physician/Extender: Frann Rider in Treatment: 2 Information Obtained from: Patient Chief Complaint Patient presents to the wound care center for a consult due non healing wound. The patient was known to have bilateral lymphedema as a status post mastectomy syndrome now comes with an  ulcerated area of her right upper arm which she's had for several weeks. Electronic Signature(s) Signed: 05/08/2015 12:57:16 PM By: [name], FACS Entered By: [name] on 05/08/2015 12:52:07 [name] ([id number]) -------------------------------------------------------------------------------- HPI Details Patient Name: [name] Date of Service: 05/08/2015 11:45 AM Medical Record Number: [id number] Patient Account Number: [id number] Date of Birth/Sex: 1956-09-07 (58 y.o. Female) Treating RN: Primary Care Physician: [name] Other Clinician: Referring Physician: [name] Treating Physician/Extender: Frann Rider in Treatment: 2 History of Present Illness Location: bilateral arm lymphedema with now skin ulceration of the right upper extremity. Quality: Patient reports experiencing a dull pain to affected area(s). Severity: Patient states wound are getting worse. Duration: Patient has had the wound for > 3 months prior to seeking treatment at the wound center Timing: Pain in wound is Intermittent (comes and goes Context: The wound occurred when the patiento. Modifying Factors: Consults to this date include: local care and antibiotics as prescribed by the PCP and the ER. Associated Signs and Symptoms: Patient reports having increase discharge. HPI Description: 59 y.o. [place] woman with stage IV breast cancer, originally diagnosed in April 2003 and now manifested chiefly by loco-regional nodal disease (neck, chest), without lung, liver, bone, or brain involvement. he has received extensive radiation and chemotherapy after the surgeries. She's always had bilateral lymphedema of her arms but now comes with an ulcerated area on her right upper arm posteriorly. Her medical Oncologist, Dr.Gustav [name], was concerned that what we may be seeing in Aayushi's right upper extremity is actually skin spread of her cancer. This needs to be biopsied and she  was sending her back to general surgery with that in mind. She placed a referral to the wound clinic . She has been on doxycycline for now. The general surgeon saw her 2 days ago and did a biopsy and this report is pending. In April 2016 she had a right upper arm venous duplex study which showed: No evidence of deep vein or superficial thrombosis involving the ooright upper  extremity and left subclavian vein. Moderate edema noted throughout the right upper extremity. 05/01/2015 the patient tells me that her surgeon revealed that her biopsy was negative and that there was no pathology found. Other than that she is doing well and she thinks her edema has gone down a bit. 05/07/2015 -- I received a call from her medical oncologist Dr. Jana Hakim, who said he strongly suspects that this is going to be a sarcoma or other carcinoma and the biopsy done previously was inadequate. He requested me to take a biopsy which I agreed to do on her next visit which is today. The pathology would be sent to [facility] pathologist in [place]. Electronic Signature(s) Signed: 05/08/2015 12:57:16 PM By: [name], FACS Entered By: [name] on 05/08/2015 12:53:42 [name] ([id number]) -------------------------------------------------------------------------------- Physical Exam Details Patient Name: [name] Date of Service: 05/08/2015 11:45 AM Medical Record Number: [id number] Patient Account Number: [id number] Date of Birth/Sex: 06/15/1956 (58 y.o. Female) Treating RN: Primary Care Physician: [name] Other Clinician: Referring Physician: [name] Treating Physician/Extender: Frann Rider in Treatment: 2 Constitutional . Pulse regular. Respirations normal and unlabored. Afebrile. . Eyes Nonicteric. Reactive to light. Ears, Nose, Mouth, and Throat Lips, teeth, and gums WNL.Marland Kitchen Moist mucosa without lesions . Neck supple and nontender. No palpable supraclavicular or  cervical adenopathy. Normal sized without goiter. Respiratory WNL. No retractions.. Cardiovascular Pedal Pulses WNL. No clubbing, cyanosis or edema. Musculoskeletal Adexa without tenderness or enlargement.. Digits and nails w/o clubbing, cyanosis, infection, petechiae, ischemia, or inflammatory conditions.. Integumentary (Hair, Skin) No suspicious lesions. No crepitus or fluctuance. No peri-wound warmth or erythema. No masses.Marland Kitchen Psychiatric Judgement and insight Intact.. No evidence of depression, anxiety, or agitation.. Notes the wound is as before and continues to have less smell and less drainage. Electronic Signature(s) Signed: 05/08/2015 12:57:16 PM By: [name], FACS Entered By: [name] on 05/08/2015 12:54:18 [name] ([id number]) -------------------------------------------------------------------------------- Physician Orders Details Patient Name: [name] Date of Service: 05/08/2015 11:45 AM Medical Record Patient Account Number: [id number] Number: Afful, RN, BSN, Treating RN: May 17, 1956 (59 y.o. Velva Harman Date of Birth/Sex: Female) Other Clinician: Primary Care Physician: [name] Treating Aisea Bouldin Referring Physician: [name] Physician/Extender: Suella Grove in Treatment: 2 Verbal / Phone Orders: Yes Clinician: Afful, RN, BSN, Rita Read Back and Verified: Yes Diagnosis Coding Wound Cleansing Wound #1 Right,Posterior Upper Arm o Clean wound with Normal Saline. o May Shower, gently pat wound dry prior to applying new dressing. Anesthetic Wound #1 Right,Posterior Upper Arm o Topical Lidocaine 4% cream applied to wound bed prior to debridement Primary Wound Dressing Wound #1 Right,Posterior Upper Arm o Aquacel Ag o Other: Secondary Dressing Wound #1 Right,Posterior Upper Arm o ABD and Kerlix/Conform Dressing Change Frequency Wound #1 Right,Posterior Upper Arm o Change dressing every other day. Follow-up  Appointments Wound #1 Right,Posterior Upper Arm o Return Appointment in 1 week. Edema Control Wound #1 Right,Posterior Upper Arm o Other: - compression sleeve Laboratory o Biopsy for Pathology - Right posterior upper arm Suchecki, Judyann ([id number]) oooo Electronic Signature(s) Signed: 05/08/2015 12:57:16 PM By: [name], FACS Signed: 05/08/2015 2:48:51 PM By: Regan Lemming BSN, RN Entered By: Regan Lemming on 05/08/2015 12:56:30 [name] ([id number]) -------------------------------------------------------------------------------- Problem List Details Patient Name: [name] Date of Service: 05/08/2015 11:45 AM Medical Record Number: [id number] Patient Account Number: [id number] Date of Birth/Sex: 05/14/1956 (58 y.o. Female) Treating RN: Primary Care Physician: [name] Other Clinician: Referring Physician: [name] Treating Physician/Extender: Frann Rider in Treatment: 2 Active  Problems ICD-10 Encounter Code Description Active Date Diagnosis S41.101A Unspecified open wound of right upper arm, initial 04/24/2015 Yes encounter Z85.3 Personal history of malignant neoplasm of breast 04/24/2015 Yes I97.2 Postmastectomy lymphedema syndrome 04/24/2015 Yes Inactive Problems Resolved Problems Electronic Signature(s) Signed: 05/08/2015 12:57:16 PM By: [name], FACS Entered By: [name] on 05/08/2015 12:47:46 [name] ([id number]) -------------------------------------------------------------------------------- Progress Note Details Patient Name: [name] Date of Service: 05/08/2015 11:45 AM Medical Record Number: [id number] Patient Account Number: [id number] Date of Birth/Sex: [birth year] (58 y.o. Female) Treating RN: Primary Care Physician: [name] Other Clinician: Referring Physician: [name] Treating Physician/Extender: Frann Rider in Treatment: 2 Subjective Chief Complaint Information obtained  from Patient Patient presents to the wound care center for a consult due non healing wound. The patient was known to have bilateral lymphedema as a status post mastectomy syndrome now comes with an ulcerated area of her right upper arm which she's had for several weeks. History of Present Illness (HPI) The following HPI elements were documented for the patient's wound: Location: bilateral arm lymphedema with now skin ulceration of the right upper extremity. Quality: Patient reports experiencing a dull pain to affected area(s). Severity: Patient states wound are getting worse. Duration: Patient has had the wound for > 3 months prior to seeking treatment at the wound center Timing: Pain in wound is Intermittent (comes and goes Context: The wound occurred when the patient . Modifying Factors: Consults to this date include: local care and antibiotics as prescribed by the PCP and the ER. Associated Signs and Symptoms: Patient reports having increase discharge. 59 y.o.  woman with stage IV breast cancer, originally diagnosed in April 2003 and now manifested chiefly by loco-regional nodal disease (neck, chest), without lung, liver, bone, or brain involvement. he has received extensive radiation and chemotherapy after the surgeries. She's always had bilateral lymphedema of her arms but now comes with an ulcerated area on her right upper arm posteriorly. Her medical Oncologist, Dr.Gustav [name], was concerned that what we may be seeing in Cherl's right upper extremity is actually skin spread of her cancer. This needs to be biopsied and she was sending her back to general surgery with that in mind. She placed a referral to the wound clinic . She has been on doxycycline for now. The general surgeon saw her 2 days ago and did a biopsy and this report is pending. In April 2016 she had a right upper arm venous duplex study which showed: No evidence of deep vein or superficial thrombosis  involving the right upper extremity and left subclavian vein. Moderate edema noted throughout the right upper extremity. 05/01/2015 the patient tells me that her surgeon revealed that her biopsy was negative and that there was no pathology found. Other than that she is doing well and she thinks her edema has gone down a bit. 05/07/2015 -- I received a call from her medical oncologist Dr. Jana Hakim, who said he strongly suspects that this is going to be a sarcoma or other carcinoma and the biopsy done previously was inadequate. He requested me to take a biopsy which I agreed to do on her next visit which is today. The pathology would be sent to [facility] pathologist in [place]. TIWANA, CHAVIS ([id number]) Objective Constitutional Pulse regular. Respirations normal and unlabored. Afebrile. Vitals Time Taken: 11:55 AM, Height: 65 in, Weight: 168 lbs, BMI: 28, Temperature: 98.5 F, Pulse: 75 bpm, Respiratory Rate: 16 breaths/min, Blood Pressure: 116/60 mmHg. Eyes Nonicteric. Reactive to light. Ears, Nose, Mouth, and  Throat Lips, teeth, and gums WNL.Marland Kitchen Moist mucosa without lesions . Neck supple and nontender. No palpable supraclavicular or cervical adenopathy. Normal sized without goiter. Respiratory WNL. No retractions.. Cardiovascular Pedal Pulses WNL. No clubbing, cyanosis or edema. Musculoskeletal Adexa without tenderness or enlargement.. Digits and nails w/o clubbing, cyanosis, infection, petechiae, ischemia, or inflammatory conditions.Marland Kitchen Psychiatric Judgement and insight Intact.. No evidence of depression, anxiety, or agitation.. General Notes: the wound is as before and continues to have less smell and less drainage. Integumentary (Hair, Skin) No suspicious lesions. No crepitus or fluctuance. No peri-wound warmth or erythema. No masses.. Wound #1 status is Open. Original cause of wound was Gradually Appeared. The wound is located on the Right,Posterior Upper Arm. The wound measures  13.2cm length x 19.5cm width x 0.2cm depth; 202.161cm^2 area and 40.432cm^3 volume. The wound is limited to skin breakdown. There is no tunneling or undermining noted. There is a small amount of serosanguineous drainage noted. The wound margin is distinct with the outline attached to the wound base. There is no granulation within the wound bed. There is a large (67-100%) amount of necrotic tissue within the wound bed including Adherent Slough. The periwound skin appearance exhibited: Moist. The periwound skin appearance did not exhibit: Callus, Crepitus, Excoriation, Fluctuance, Friable, Induration, Localized Edema, Rash, Scarring, Dry/Scaly, Maceration, Atrophie Blanche, Cyanosis, Ecchymosis, Hemosiderin Staining, Mottled, Pallor, Rubor, Loewenstein, [place] ([id number]) Erythema. Assessment Active Problems ICD-10 S41.101A - Unspecified open wound of right upper arm, initial encounter Z85.3 - Personal history of malignant neoplasm of breast I97.2 - Postmastectomy lymphedema syndrome After taking 3 large pieces of tissue for pathology hemostasis was achieved and an appropriate dressing was applied with silver alginate, Carboflex, and a light wrap. Dressing changes discussed in detail with the patient and her caregiver and she will come back and see as next week. Procedures Wound #1 Wound #1 is a Lymphedema located on the Right, Posterior Upper Arm . There was a biopsy performed by Jamiracle Avants, Jackson Latino., MD. There was a biopsy performed on Wound Margin, Peri-Ulcer Tissue. The skin was cleansed and prepped with anti-septic followed by pain control using Lidocaine Injectable: 2%. Tissue was removed at its base with the following instrument(s): Blade and Forceps and sent to pathology. A Moderate amount of bleeding was controlled with Silver Nitrate. A time out was conducted prior to the start of the procedure. The procedure was tolerated well with a pain level of 1 throughout and a pain level of 0  following the procedure. General Notes: Wedge biopsies taken from 3 different areas under local anesthesia. Sent in formalin to pathology which is our [facility] pathology.. Plan Wound Cleansing: Wound #1 Right,Posterior Upper Arm: Clean wound with Normal Saline. May Shower, gently pat wound dry prior to applying new dressing. AnestheticMAIRELY, FOXWORTH ([id number]) Wound #1 Right,Posterior Upper Arm: Topical Lidocaine 4% cream applied to wound bed prior to debridement Primary Wound Dressing: Wound #1 Right,Posterior Upper Arm: Aquacel Ag Other: Secondary Dressing: Wound #1 Right,Posterior Upper Arm: ABD and Kerlix/Conform Dressing Change Frequency: Wound #1 Right,Posterior Upper Arm: Change dressing every other day. Follow-up Appointments: Wound #1 Right,Posterior Upper Arm: Return Appointment in 1 week. Edema Control: Wound #1 Right,Posterior Upper Arm: Other: - compression sleeve After taking 3 large pieces of tissue for pathology hemostasis was achieved and an appropriate dressing was applied with silver alginate, Carboflex, and a light wrap. Dressing changes discussed in detail with the patient and her caregiver and she will come back and see as next week. Electronic Signature(s) Signed: 05/08/2015  12:57:16 PM By: [name], FACS Entered By: [name] on 05/08/2015 12:56:33 [name] ([id number]) -------------------------------------------------------------------------------- SuperBill Details Patient Name: [name] Date of Service: 05/08/2015 Medical Record Number: [id number] Patient Account Number: [id number] Date of Birth/Sex: July 08, 1956 (58 y.o. Female) Treating RN: Primary Care Physician: [name] Other Clinician: Referring Physician: [name] Treating Physician/Extender: Frann Rider in Treatment: 2 Diagnosis Coding ICD-10 Codes Code Description S41.101A Unspecified open wound of right upper arm, initial encounter Z85.3  Personal history of malignant neoplasm of breast I97.2 Postmastectomy lymphedema syndrome Facility Procedures CPT4 Code: [id number] Description: 11100 - BIOPSY SKIN-ONE ICD-10 Description Diagnosis S41.101A Unspecified open wound of right upper arm, init Z85.3 Personal history of malignant neoplasm of breas I97.2 Postmastectomy lymphedema syndrome Modifier: ial encounter t Quantity: 1 Physician Procedures CPT4 Code: [id number] Description: 11100 - WC PHYS BIOPSY-SKIN ONE ICD-10 Description Diagnosis S41.101A Unspecified open wound of right upper arm, initi Z85.3 Personal history of malignant neoplasm of breast I97.2 Postmastectomy lymphedema syndrome Modifier: al encounter Quantity: 1 Electronic Signature(s) Signed: 05/08/2015 12:57:16 PM By: [name], FACS Entered By: [name] on 05/08/2015 12:56:47

## 2015-05-12 ENCOUNTER — Other Ambulatory Visit: Payer: Self-pay | Admitting: Oncology

## 2015-05-12 ENCOUNTER — Ambulatory Visit (HOSPITAL_COMMUNITY)
Admission: RE | Admit: 2015-05-12 | Discharge: 2015-05-12 | Disposition: A | Discharge status: Home / Self Care | Payer: Commercial Managed Care - PPO | Source: Ambulatory Visit | Attending: Oncology | Admitting: Oncology

## 2015-05-12 DIAGNOSIS — M75101 Unspecified rotator cuff tear or rupture of right shoulder, not specified as traumatic: Secondary | ICD-10-CM | POA: Insufficient documentation

## 2015-05-12 DIAGNOSIS — M25511 Pain in right shoulder: Secondary | ICD-10-CM

## 2015-05-12 DIAGNOSIS — C50911 Malignant neoplasm of unspecified site of right female breast: Secondary | ICD-10-CM

## 2015-05-12 DIAGNOSIS — I868 Varicose veins of other specified sites: Secondary | ICD-10-CM | POA: Diagnosis not present

## 2015-05-12 DIAGNOSIS — M129 Arthropathy, unspecified: Secondary | ICD-10-CM | POA: Insufficient documentation

## 2015-05-12 DIAGNOSIS — S4381XA Sprain of other specified parts of right shoulder girdle, initial encounter: Secondary | ICD-10-CM | POA: Diagnosis not present

## 2015-05-12 MED ORDER — GADOBENATE DIMEGLUMINE 529 MG/ML IV SOLN
20.0000 mL | Freq: Once | INTRAVENOUS | Status: AC | PRN
  Administered 2015-05-12: 17 mL via INTRAVENOUS

## 2015-05-12 NOTE — Progress Notes (Signed)
Left chest Port accessed for contrast.  Good blood return obtained, contrast pushed by CT tech then saline and heparin flush by me.  Deaccessed.   Dimitry Holsworth, Nicolette Bang, RN VAST

## 2015-05-13 ENCOUNTER — Telehealth: Payer: Self-pay | Admitting: *Deleted

## 2015-05-13 ENCOUNTER — Telehealth: Payer: Self-pay

## 2015-05-13 ENCOUNTER — Other Ambulatory Visit: Payer: Self-pay | Admitting: Oncology

## 2015-05-13 NOTE — Telephone Encounter (Signed)
S/w pt: pt feels she needs medical intervention. She has 2 sisters that had cancer and both had swelling like she does. They were placed in hospital and received iv antibiotics and got better. She feels she needs to go into hospital and receive antibiotics to get better. She states she has been talking to RN at [facility] [place] in [place] who said this also. She has been getting this 2nd opinion over the phone. The nurse is named Designer, jewellery. Pt will not give Carla's phone number. She is worried about losing her arm. She is insistent that she needs to go into hospital and receive antibiotics.

## 2015-05-13 NOTE — Telephone Encounter (Signed)
This RN returned call to [name]- and before this RN was able to ask her concern- [name] immediately started talking-   Per phone discussion Ressie stated she called " the [facility] and they told me I needed to be given 24 hour antibiotic "  This RN was able to let pt state her concerns including " need something done and ya'll just keep doing scans "  This RN validated pt's concerns- including prior mention to [place] regarding possible " loosing arm ".  Per phone discussion- this RN reiterated that presently the scan shows fluid and a tear in the muscle as well as  Pt is not running fevers. Discussed at this time a bacterial infection has not been identified which is what would respond to an antibiotic.  Per pt's communication and this RN reiterating current plan of care which is getting a better diagnosis so appropriate treatment can be started for best outcome. This RN stated goal of plan is for pt's arm to have less lymphedema and greater use. Reviewed appointment for Friday with plan for MD to have better answers for treatment decisions- and if she feels she wants to be treated by another MD or at another facility we can help make arrangements with referral.  Presently due to no contra indacation per scan - pt may have her hand wrapped for comfort. Appointment made with the lymphedema clinic for 8am in am.  Per end of phone conversation pt was able to state " thank you- I can wait until Friday and hear what [name] says".

## 2015-05-13 NOTE — Telephone Encounter (Signed)
VM message received @ 2:32 pm. Pt requesting to speak to Val, RN with Dr. Jana Hakim. Spoke with Eden Emms said she would call patient hopefully this afternoon.

## 2015-05-13 NOTE — Telephone Encounter (Signed)
Pt requested we call her

## 2015-05-13 NOTE — Telephone Encounter (Signed)
MRI yesterday, s/w pt and assured her that [name] would be informed that MRI is done. Also that [name] would have Val call pt if he saw anything urgent on the MRI. Pt does have appt on 7/8 with [name]. Pt stated she had a biopsy last Friday. Told her no biopsy results available.

## 2015-05-13 NOTE — Telephone Encounter (Signed)
Pt called stating she needs to talk with [name]. She feels like she is being neglected. This RN talked with pt earlier and told her [name] will be informed of her phone call. That message was routed. This RN is also aware that Alleman also called Val and spoke with her that pt is wanting a phone call.

## 2015-05-14 ENCOUNTER — Other Ambulatory Visit: Payer: Self-pay | Admitting: Oncology

## 2015-05-14 ENCOUNTER — Ambulatory Visit: Payer: Commercial Managed Care - PPO | Attending: Oncology

## 2015-05-14 ENCOUNTER — Telehealth: Payer: Self-pay | Admitting: *Deleted

## 2015-05-14 ENCOUNTER — Other Ambulatory Visit: Payer: Self-pay | Admitting: *Deleted

## 2015-05-14 DIAGNOSIS — I972 Postmastectomy lymphedema syndrome: Secondary | ICD-10-CM | POA: Insufficient documentation

## 2015-05-14 DIAGNOSIS — M25611 Stiffness of right shoulder, not elsewhere classified: Secondary | ICD-10-CM

## 2015-05-14 DIAGNOSIS — L989 Disorder of the skin and subcutaneous tissue, unspecified: Secondary | ICD-10-CM | POA: Insufficient documentation

## 2015-05-14 DIAGNOSIS — C4911 Malignant neoplasm of connective and soft tissue of right upper limb, including shoulder: Secondary | ICD-10-CM | POA: Insufficient documentation

## 2015-05-14 NOTE — Telephone Encounter (Signed)
This RN noted per Dr Ardeen Garland dictation on 7/1- second biopsy was performed and sent to [facility] in [place].  [facility] is the same as Therapist, art - phone number [phone]  Lab contacted and spoke with Maximino Sarin- she verified path was received and reading is not completed per Dr Jaynie Collins.  This RN informed Arrie Aran pt is scheduled for visit tomorrow to discuss results. [name] direct cell number given for communication.  Dawn states she will inform Dr Jaynie Collins of above communication with cell number.

## 2015-05-14 NOTE — Therapy (Addendum)
[place], [place], [address] Phone: [phone]   Fax:  [phone]  Physical Therapy Treatment  Patient Details  Name: [name] MRN: [id number] Date of Birth: 1956-02-11 Referring Provider:  [name], MD  Encounter Date: 05/14/2015      PT End of Session - 05/14/15 0845    Visit Number 35   Number of Visits 70   Date for PT Re-Evaluation 06/06/15   PT Start Time 0828   PT Stop Time 0838   PT Time Calculation (min) 10 min   Activity Tolerance Patient tolerated treatment well   Behavior During Therapy [facility] for tasks assessed/performed      Past Medical History  Diagnosis Date  . DVT (deep venous thrombosis) 10/07/2011  . Breast cancer   . Hypertension   . Rheumatic fever     Past Surgical History  Procedure Laterality Date  . Mastectomy      bilateral  . Breast surgery    . Port a cath revision      There were no vitals filed for this visit.  Visit Diagnosis:  Postmastectomy lymphedema  Skin lesion of right upper extremity  Stiffness of joint, shoulder region, right      Subjective Assessment - 05/14/15 0840    Subjective "I have to hurry up and leave, I have somewhere else to be. I see Dr. Jana Hakim tomorrow for my results, I know it's not cancer! I just need a stronger antibiotic"   Currently in Pain? Other (Comment)  Patient just kept saying she had a pulled ligament in her neck from her arm being so heavy.                          [facility] Adult PT Treatment/Exercise - 05/14/15 0001    Manual Therapy   Compression Bandaging TG soft stockinette size medium up to mid Rt forearm at skin fold, Elastomull x2 fixated with paper tape, Artiflex x1, and 1-6 cm to hand and 1-8cm (short stretch compession bandages utilized) in herring bone fashion from wrist to forearm.                   Short Term Clinic Goals - 04/23/15 1633    CC Short Term  Goal  #2   Title Rt. UE circumference at 15 cm. proximal to ulnar styloid will reduce by at least 2 cm.   Status On-going             Long Term Clinic Goals - 04/23/15 1633    CC Long Term Goal  #1   Title Rt. UE circumference at 15 cm. proximal to olecranon will reduce by at least 2 cm.   Status On-going   CC Long Term Goal  #2   Title Rt. UE circumference at 15 cm. proximal to ulnar styloid will reduce by at least 3 cm.   Status On-going   CC Long Term Goal  #4   Title Pt. will be independent in self-management of lymphedema, including manual lymph drainage.   Status On-going            Plan - 05/14/15 0846    Clinical Impression Statement Patient was eager to have hand wrapped and hurry up and leave reporting she had somewhere else to be. Patient is very adamant about believing that she has no cancer and reports may even go for a second opinion if she is told otherwise. Doesn't  belive everything is being done for her, reporting she thinks a just needs a better antibiotic, even though I explained to her that she has a great team working with and for her and she is our top priority, as every one of our patients is.     Pt will benefit from skilled therapeutic intervention in order to improve on the following deficits Decreased skin integrity;Increased edema;Impaired UE functional use   Rehab Potential Fair   Clinical Impairments Affecting Rehab Potential Open sore being treated by wound clinic   PT Next Visit Plan Gets result of second biopsy tomorrow which will allow [name] to decide future plan of care.    Consulted and Agree with Plan of Care Patient        Problem List Patient Active Problem List   Diagnosis Date Noted  . Primary angiosarcoma of right upper extremity 05/14/2015  . Chronic pain 04/13/2015  . Left arm pain 08/18/2014  . Breast cancer of upper-inner quadrant of right female breast 10/03/2013  . Post-lymphadenectomy lymphedema of arm 08/15/2013  . Port or  reservoir infection 01/29/2013  . DVT (deep venous thrombosis) 10/07/2011  . Chest pain 09/10/2009  . Secondary cardiomyopathy 09/02/2009  . Essential hypertension 02/26/2009  . GERD 02/26/2009    [name], PTA 05/14/2015, 8:52 AM  [place], [place], [address] Phone: [phone]   Fax:  [phone]   PHYSICAL THERAPY DISCHARGE SUMMARY  Visits from Start of Care: 35  Current functional level related to goals / functional outcomes: Patient began having a change in her medical status which indicated other pathology.  She was being closely followed by her oncologist and there was concern about recurrence.  Therapy was stopped due to these concerns and she pursued treatment in [place], [place].   Remaining deficits: The patient stopped by the clinic about 2 months ago to show therapists her arm which appeared to be well healed and the swelling was significantly reduced due to treatment in [place].  She was encouraged to contact us if needed.   Education / Equipment: Compression bandages / garments Plan: Patient agrees to discharge.  Patient goals were partially met. Patient is being discharged due to a change in medical status.  ?????       [name], [place] 11/18/2015 9:12 AM

## 2015-05-15 ENCOUNTER — Ambulatory Visit (HOSPITAL_BASED_OUTPATIENT_CLINIC_OR_DEPARTMENT_OTHER): Payer: Commercial Managed Care - PPO

## 2015-05-15 ENCOUNTER — Encounter: Payer: Commercial Managed Care - PPO | Admitting: Surgery

## 2015-05-15 ENCOUNTER — Ambulatory Visit (HOSPITAL_BASED_OUTPATIENT_CLINIC_OR_DEPARTMENT_OTHER): Payer: Commercial Managed Care - PPO | Admitting: Oncology

## 2015-05-15 ENCOUNTER — Other Ambulatory Visit (HOSPITAL_BASED_OUTPATIENT_CLINIC_OR_DEPARTMENT_OTHER): Payer: Commercial Managed Care - PPO

## 2015-05-15 VITALS — BP 116/62 | HR 90 | Temp 98.0°F | Resp 20 | Ht 65.0 in | Wt 163.1 lb

## 2015-05-15 DIAGNOSIS — C50211 Malignant neoplasm of upper-inner quadrant of right female breast: Secondary | ICD-10-CM

## 2015-05-15 DIAGNOSIS — I890 Lymphedema, not elsewhere classified: Secondary | ICD-10-CM

## 2015-05-15 DIAGNOSIS — S41101A Unspecified open wound of right upper arm, initial encounter: Secondary | ICD-10-CM | POA: Diagnosis not present

## 2015-05-15 DIAGNOSIS — C778 Secondary and unspecified malignant neoplasm of lymph nodes of multiple regions: Secondary | ICD-10-CM

## 2015-05-15 DIAGNOSIS — C4911 Malignant neoplasm of connective and soft tissue of right upper limb, including shoulder: Secondary | ICD-10-CM

## 2015-05-15 DIAGNOSIS — C50911 Malignant neoplasm of unspecified site of right female breast: Secondary | ICD-10-CM

## 2015-05-15 DIAGNOSIS — Z86718 Personal history of other venous thrombosis and embolism: Secondary | ICD-10-CM

## 2015-05-15 DIAGNOSIS — C50919 Malignant neoplasm of unspecified site of unspecified female breast: Secondary | ICD-10-CM | POA: Diagnosis not present

## 2015-05-15 DIAGNOSIS — Z853 Personal history of malignant neoplasm of breast: Secondary | ICD-10-CM | POA: Diagnosis not present

## 2015-05-15 DIAGNOSIS — Z95828 Presence of other vascular implants and grafts: Secondary | ICD-10-CM

## 2015-05-15 DIAGNOSIS — I972 Postmastectomy lymphedema syndrome: Secondary | ICD-10-CM | POA: Diagnosis not present

## 2015-05-15 LAB — CBC WITH DIFFERENTIAL/PLATELET
BASO%: 1.2 % (ref 0.0–2.0)
Basophils Absolute: 0.1 10*3/uL (ref 0.0–0.1)
EOS%: 1 % (ref 0.0–7.0)
Eosinophils Absolute: 0.1 10*3/uL (ref 0.0–0.5)
HCT: 31.5 % — ABNORMAL LOW (ref 34.8–46.6)
HGB: 10.5 g/dL — ABNORMAL LOW (ref 11.6–15.9)
LYMPH%: 10.1 % — ABNORMAL LOW (ref 14.0–49.7)
MCH: 27.7 pg (ref 25.1–34.0)
MCHC: 33.2 g/dL (ref 31.5–36.0)
MCV: 83.4 fL (ref 79.5–101.0)
MONO#: 1 10*3/uL — ABNORMAL HIGH (ref 0.1–0.9)
MONO%: 8.9 % (ref 0.0–14.0)
NEUT#: 8.6 10*3/uL — ABNORMAL HIGH (ref 1.5–6.5)
NEUT%: 78.8 % — ABNORMAL HIGH (ref 38.4–76.8)
Platelets: 387 10*3/uL (ref 145–400)
RBC: 3.78 10*6/uL (ref 3.70–5.45)
RDW: 12.7 % (ref 11.2–14.5)
WBC: 11 10*3/uL — ABNORMAL HIGH (ref 3.9–10.3)
lymph#: 1.1 10*3/uL (ref 0.9–3.3)

## 2015-05-15 MED ORDER — HEPARIN SOD (PORK) LOCK FLUSH 100 UNIT/ML IV SOLN
500.0000 [IU] | Freq: Once | INTRAVENOUS | Status: AC
  Administered 2015-05-15: 500 [IU] via INTRAVENOUS
  Filled 2015-05-15: qty 5

## 2015-05-15 MED ORDER — SODIUM CHLORIDE 0.9 % IJ SOLN
10.0000 mL | INTRAMUSCULAR | Status: DC | PRN
  Administered 2015-05-15: 10 mL via INTRAVENOUS
  Filled 2015-05-15: qty 10

## 2015-05-15 NOTE — Progress Notes (Signed)
[name], [name] ([id number]) Visit Report for 05/15/2015 Arrival Information Details Patient Name: [name], [name] Date of Service: 05/15/2015 3:15 PM Medical Record Number: [id number] Patient Account Number: [id number] Date of Birth/Sex: Oct 31, 1956 (59 y.o. Female) Treating RN: Afful, RN, BSN, Velva Harman Primary Care Physician: [name] Other Clinician: Referring Physician: [name] Treating Physician/Extender: Frann Rider in Treatment: 3 Visit Information History Since Last Visit Any new allergies or adverse reactions: No Patient Arrived: Ambulatory Had a fall or experienced change in No Arrival Time: 14:52 activities of daily living that may affect Accompanied By: HUSBAND risk of falls: Transfer Assistance: None Signs or symptoms of abuse/neglect since last No Patient Identification Verified: Yes visito Secondary Verification Process Yes Hospitalized since last visit: No Completed: Has Dressing in Place as Prescribed: Yes Patient Requires Transmission-Based No Pain Present Now: No Precautions: Patient Has Alerts: No Electronic Signature(s) Signed: 05/15/2015 2:53:48 PM By: Regan Lemming BSN, RN Entered By: Regan Lemming on 05/15/2015 14:53:48 [name] ([id number]) -------------------------------------------------------------------------------- Clinic Level of Care Assessment Details Patient Name: [name] Date of Service: 05/15/2015 3:15 PM Medical Record Number: [id number] Patient Account Number: [id number] Date of Birth/Sex: Apr 19, 1956 (58 y.o. Female) Treating RN: Afful, RN, BSN, [place] Primary Care Physician: [name] Other Clinician: Referring Physician: [name] Treating Physician/Extender: Frann Rider in Treatment: 3 Clinic Level of Care Assessment Items TOOL 4 Quantity Score []  - Use when only an EandM is performed on FOLLOW-UP visit 0 ASSESSMENTS - Nursing Assessment / Reassessment X - Reassessment of Co-morbidities  (includes updates in patient status) 1 10 X - Reassessment of Adherence to Treatment Plan 1 5 ASSESSMENTS - Wound and Skin Assessment / Reassessment X - Simple Wound Assessment / Reassessment - one wound 1 5 []  - Complex Wound Assessment / Reassessment - multiple wounds 0 []  - Dermatologic / Skin Assessment (not related to wound area) 0 ASSESSMENTS - Focused Assessment []  - Circumferential Edema Measurements - multi extremities 0 []  - Nutritional Assessment / Counseling / Intervention 0 []  - Lower Extremity Assessment (monofilament, tuning fork, pulses) 0 []  - Peripheral Arterial Disease Assessment (using hand held doppler) 0 ASSESSMENTS - Ostomy and/or Continence Assessment and Care []  - Incontinence Assessment and Management 0 []  - Ostomy Care Assessment and Management (repouching, etc.) 0 PROCESS - Coordination of Care X - Simple Patient / Family Education for ongoing care 1 15 []  - Complex (extensive) Patient / Family Education for ongoing care 0 []  - Staff obtains Programmer, systems, Records, Test Results / Process Orders 0 []  - Staff telephones HHA, Nursing Homes / Clarify orders / etc 0 []  - Routine Transfer to another Facility (non-emergent condition) 0 Ryall, Britton ([id number]) []  - Routine Hospital Admission (non-emergent condition) 0 []  - New Admissions / Biomedical engineer / Ordering NPWT, Apligraf, etc. 0 []  - Emergency Hospital Admission (emergent condition) 0 X - Simple Discharge Coordination 1 10 []  - Complex (extensive) Discharge Coordination 0 PROCESS - Special Needs []  - Pediatric / Minor Patient Management 0 []  - Isolation Patient Management 0 []  - Hearing / Language / Visual special needs 0 []  - Assessment of Community assistance (transportation, D/C planning, etc.) 0 []  - Additional assistance / Altered mentation 0 []  - Support Surface(s) Assessment (bed, cushion, seat, etc.) 0 INTERVENTIONS - Wound Cleansing / Measurement X - Simple Wound Cleansing - one wound 1  5 []  - Complex Wound Cleansing - multiple wounds 0 X - Wound Imaging (photographs - any number of wounds) 1 5 []  - Wound Tracing (instead of photographs) 0  X - Simple Wound Measurement - one wound 1 5 []  - Complex Wound Measurement - multiple wounds 0 INTERVENTIONS - Wound Dressings X - Small Wound Dressing one or multiple wounds 1 10 []  - Medium Wound Dressing one or multiple wounds 0 []  - Large Wound Dressing one or multiple wounds 0 []  - Application of Medications - topical 0 []  - Application of Medications - injection 0 INTERVENTIONS - Miscellaneous []  - External ear exam 0 Maahs, Shawnia ([id number]) []  - Specimen Collection (cultures, biopsies, blood, body fluids, etc.) 0 []  - Specimen(s) / Culture(s) sent or taken to Lab for analysis 0 []  - Patient Transfer (multiple staff / Harrel Lemon Lift / Similar devices) 0 []  - Simple Staple / Suture removal (25 or less) 0 []  - Complex Staple / Suture removal (26 or more) 0 []  - Hypo / Hyperglycemic Management (close monitor of Blood Glucose) 0 []  - Ankle / Brachial Index (ABI) - do not check if billed separately 0 X - Vital Signs 1 5 Has the patient been seen at the hospital within the last three years: Yes Total Score: 75 Level Of Care: ____ Electronic Signature(s) Signed: 05/15/2015 3:27:12 PM By: Regan Lemming BSN, RN Entered By: Regan Lemming on 05/15/2015 15:27:11 [name] ([id number]) -------------------------------------------------------------------------------- Encounter Discharge Information Details Patient Name: [name] Date of Service: 05/15/2015 3:15 PM Medical Record Number: [id number] Patient Account Number: [id number] Date of Birth/Sex: [birth year] (58 y.o. Female) Treating RN: Baruch Gouty, RN, BSN, Velva Harman Primary Care Physician: [name] Other Clinician: Referring Physician: [name] Treating Physician/Extender: Frann Rider in Treatment: 3 Encounter Discharge Information Items Discharge Pain Level:  0 Discharge Condition: Stable Ambulatory Status: Ambulatory Discharge Destination: Home Private Transportation: Auto Accompanied By: husband Schedule Follow-up Appointment: No Medication Reconciliation completed and No provided to Patient/Care [place]: Clinical Summary of Care: Electronic Signature(s) Signed: 05/15/2015 3:28:52 PM By: Regan Lemming BSN, RN Entered By: Regan Lemming on 05/15/2015 15:28:52 [name] ([id number]) -------------------------------------------------------------------------------- Lower Extremity Assessment Details Patient Name: [name] Date of Service: 05/15/2015 3:15 PM Medical Record Number: [id number] Patient Account Number: [id number] Date of Birth/Sex: [birth year] (58 y.o. Female) Treating RN: Baruch Gouty, RN, BSN, Velva Harman Primary Care Physician: [name] Other Clinician: Referring Physician: [name] Treating Physician/Extender: Frann Rider in Treatment: 3 Electronic Signature(s) Signed: 05/15/2015 2:55:54 PM By: Regan Lemming BSN, RN Entered By: Regan Lemming on 05/15/2015 14:55:54 [name] ([id number]) -------------------------------------------------------------------------------- Multi Wound Chart Details Patient Name: [name] Date of Service: 05/15/2015 3:15 PM Medical Record Number: [id number] Patient Account Number: [id number] Date of Birth/Sex: 10-Sep-1956 (58 y.o. Female) Treating RN: Baruch Gouty, RN, BSN, Velva Harman Primary Care Physician: [name] Other Clinician: Referring Physician: [name] Treating Physician/Extender: Frann Rider in Treatment: 3 Vital Signs Height(in): 65 Pulse(bpm): 88 Weight(lbs): 168 Blood Pressure 97/60 (mmHg): Body Mass Index(BMI): 28 Temperature(F): 98.5 Respiratory Rate 17 (breaths/min): Photos: [1:No Photos] [N/A:N/A] Wound Location: [1:Right Upper Arm - Posterior] [N/A:N/A] Wounding Event: [1:Gradually Appeared] [N/A:N/A] Primary Etiology: [1:Lymphedema]  [N/A:N/A] Comorbid History: [1:Deep Vein Thrombosis, Hypertension, Received Chemotherapy, Received Radiation] [N/A:N/A] Date Acquired: [1:03/23/2015] [N/A:N/A] Weeks of Treatment: [1:3] [N/A:N/A] Wound Status: [1:Open] [N/A:N/A] Measurements L x W x D 13x21.5x0.2 [N/A:N/A] (cm) Area (cm) : [1:219.519] [N/A:N/A] Volume (cm) : [1:43.904] [N/A:N/A] % Reduction in Area: [1:-38.00%] [N/A:N/A] % Reduction in Volume: -38.00% [N/A:N/A] Classification: [1:Partial Thickness] [N/A:N/A] Exudate Amount: [1:Small] [N/A:N/A] Exudate Type: [1:Serosanguineous] [N/A:N/A] Exudate Color: [1:red, brown] [N/A:N/A] Wound Margin: [1:Distinct, outline attached] [N/A:N/A] Granulation Amount: [1:Small (1-33%)] [N/A:N/A] Granulation Quality: [1:Pink, Pale] [N/A:N/A] Necrotic Amount: [1:Large (67-100%)] [  N/A:N/A] Exposed Structures: [1:Fascia: No Fat: No Tendon: No Muscle: No] [N/A:N/A] Joint: No Bone: No Limited to Skin Breakdown Epithelialization: Small (1-33%) N/A N/A Periwound Skin Texture: Edema: Yes N/A N/A Excoriation: No Induration: No Callus: No Crepitus: No Fluctuance: No Friable: No Rash: No Scarring: No Periwound Skin Moist: Yes N/A N/A Moisture: Maceration: No Dry/Scaly: No Periwound Skin Color: Atrophie Blanche: No N/A N/A Cyanosis: No Ecchymosis: No Erythema: No Hemosiderin Staining: No Mottled: No Pallor: No Rubor: No Tenderness on No N/A N/A Palpation: Wound Preparation: Ulcer Cleansing: N/A N/A Rinsed/Irrigated with Saline Topical Anesthetic Applied: Other: lidocaine 4% Treatment Notes Electronic Signature(s) Signed: 05/15/2015 3:09:58 PM By: Regan Lemming BSN, RN Entered By: Regan Lemming on 05/15/2015 15:09:58 [name] ([id number]) -------------------------------------------------------------------------------- [place] Details Patient Name: [name] Date of Service: 05/15/2015 3:15 PM Medical Record Number: [id number] Patient Account  Number: [id number] Date of Birth/Sex: 1956/04/06 (58 y.o. Female) Treating RN: Afful, RN, BSN, Velva Harman Primary Care Physician: [name] Other Clinician: Referring Physician: [name] Treating Physician/Extender: Frann Rider in Treatment: 3 Active Inactive Malignancy/Atypical Etiology Nursing Diagnoses: Knowledge deficit related to disease process and management of atypical ulcer etiology Goals: Patient/caregiver will verbalize understanding of disease process and disease management of atypical ulcer etiology Date Initiated: 04/24/2015 Goal Status: Active Interventions: Provide education on atypical ulcer etiologies Treatment Activities: Biopsy : 05/08/2015 Notes: Orientation to the Wound Care Program Nursing Diagnoses: Knowledge deficit related to the wound healing center program Goals: Patient/caregiver will verbalize understanding of the [place] Program Date Initiated: 04/24/2015 Goal Status: Active Interventions: Provide education on orientation to the wound center Notes: Wound/Skin Impairment Nursing Diagnoses: Impaired tissue integrity Tatum, Vikkie ([id number]) Goals: Ulcer/skin breakdown will have a volume reduction of 30% by week 4 Date Initiated: 04/24/2015 Goal Status: Active Interventions: Assess ulceration(s) every visit Notes: Electronic Signature(s) Signed: 05/15/2015 3:09:51 PM By: Regan Lemming BSN, RN Entered By: Regan Lemming on 05/15/2015 15:09:50 [name] ([id number]) -------------------------------------------------------------------------------- Pain Assessment Details Patient Name: [name] Date of Service: 05/15/2015 3:15 PM Medical Record Number: [id number] Patient Account Number: [id number] Date of Birth/Sex: 1956/01/10 (58 y.o. Female) Treating RN: Baruch Gouty, RN, BSN, Velva Harman Primary Care Physician: [name] Other Clinician: Referring Physician: [name] Treating Physician/Extender: Frann Rider in Treatment: 3 Active Problems Location of Pain Severity and Description of Pain Patient Has Paino No Site Locations Pain Management and Medication Current Pain Management: Electronic Signature(s) Signed: 05/15/2015 2:53:54 PM By: Regan Lemming BSN, RN Entered By: Regan Lemming on 05/15/2015 14:53:54 [name] ([id number]) -------------------------------------------------------------------------------- Patient/Caregiver Education Details Patient Name: [name] Date of Service: 05/15/2015 3:15 PM Medical Record Number: [id number] Patient Account Number: [id number] Date of Birth/Gender: [birth year] (58 y.o. Female) Treating RN: Baruch Gouty, RN, BSN, Velva Harman Primary Care Physician: [name] Other Clinician: Referring Physician: [name] Treating Physician/Extender: Frann Rider in Treatment: 3 Education Assessment Education Provided To: Patient Education Topics Provided Malignant/Atypical Wounds: Handouts: Malignant/Atypical Wounds Responses: Reinforcements needed Welcome [facility] [place]: Methods: Explain/Verbal Responses: State content correctly Electronic Signature(s) Signed: 05/15/2015 3:29:16 PM By: Regan Lemming BSN, RN Entered By: Regan Lemming on 05/15/2015 15:29:16 [name] ([id number]) -------------------------------------------------------------------------------- Wound Assessment Details Patient Name: [name] Date of Service: 05/15/2015 3:15 PM Medical Record Number: [id number] Patient Account Number: [id number] Date of Birth/Sex: 02-03-1956 (58 y.o. Female) Treating RN: Baruch Gouty, RN, BSN, Velva Harman Primary Care Physician: [name] Other Clinician: Referring Physician: [name] Treating Physician/Extender: Frann Rider in Treatment: 3 Wound Status Wound Number: 1 Primary Lymphedema Etiology: Wound  Location: Right Upper Arm - Posterior Wound Open Wounding Event: Gradually Appeared Status: Date  Acquired: 03/23/2015 Comorbid Deep Vein Thrombosis, Hypertension, Weeks Of Treatment: 3 History: Received Chemotherapy, Received Clustered Wound: No Radiation Photos Photo Uploaded By: Regan Lemming on 05/15/2015 16:35:51 Wound Measurements Length: (cm) 13 Width: (cm) 21.5 Depth: (cm) 0.2 Area: (cm) 219.519 Volume: (cm) 43.904 % Reduction in Area: -38% % Reduction in Volume: -38% Epithelialization: Small (1-33%) Tunneling: No Undermining: No Wound Description Classification: Partial Thickness Wound Margin: Distinct, outline attached Exudate Amount: Small Exudate Type: Serosanguineous Exudate Color: red, brown Foul Odor After Cleansing: No Wound Bed Granulation Amount: Small (1-33%) Exposed Structure Granulation Quality: Pink, Pale Fascia Exposed: No Necrotic Amount: Large (67-100%) Fat Layer Exposed: No Necrotic Quality: Adherent Slough Tendon Exposed: No Bunch, Shrika ([id number]) Muscle Exposed: No Joint Exposed: No Bone Exposed: No Limited to Skin Breakdown Periwound Skin Texture Texture Color No Abnormalities Noted: No No Abnormalities Noted: No Callus: No Atrophie Blanche: No Crepitus: No Cyanosis: No Excoriation: No Ecchymosis: No Fluctuance: No Erythema: No Friable: No Hemosiderin Staining: No Induration: No Mottled: No Localized Edema: Yes Pallor: No Rash: No Rubor: No Scarring: No Moisture No Abnormalities Noted: No Dry / Scaly: No Maceration: No Moist: Yes Wound Preparation Ulcer Cleansing: Rinsed/Irrigated with Saline Topical Anesthetic Applied: Other: lidocaine 4%, Treatment Notes Wound #1 (Right, Posterior Upper Arm) 1. Cleansed with: Clean wound with Normal Saline 4. Dressing Applied: Aquacel Ag 5. Secondary Dressing Applied ABD and Kerlix/Conform 7. Secured with Tape Notes Carboflex applied for odour control. Fish net stocking applied to help keep dressing in place. Electronic Signature(s) Signed: 05/15/2015 3:05:29 PM By:  Regan Lemming BSN, RN Entered By: Regan Lemming on 05/15/2015 15:05:29 [name] ([id number]) -------------------------------------------------------------------------------- Vitals Details Patient Name: [name] Date of Service: 05/15/2015 3:15 PM Medical Record Number: [id number] Patient Account Number: [id number] Date of Birth/Sex: Oct 04, 1956 (58 y.o. Female) Treating RN: Afful, RN, BSN, [place] Primary Care Physician: [name] Other Clinician: Referring Physician: [name] Treating Physician/Extender: Frann Rider in Treatment: 3 Vital Signs Time Taken: 14:54 Temperature (F): 98.5 Height (in): 65 Pulse (bpm): 88 Weight (lbs): 168 Respiratory Rate (breaths/min): 17 Body Mass Index (BMI): 28 Blood Pressure (mmHg): 97/60 Reference Range: 80 - 120 mg / dl Electronic Signature(s) Signed: 05/15/2015 2:55:39 PM By: Regan Lemming BSN, RN Entered By: Regan Lemming on 05/15/2015 14:55:38

## 2015-05-15 NOTE — Progress Notes (Signed)
[place]  [phone] [phone]  OFFICE PROGRESS NOTE   ID: Chancie Lampert   DOB: 1959-11-12  MR#: [id number]  EHU#:[id number]   PCP: [name], MD [name], [name], [name] RAD ZCH:[id number] [name], [name] OTHER:  [name], MD, [name]  CHIEF COMPLAINT:  Stage IV Breast Cancer, angiosarcoma  CURRENT TREATMENT: Awaiting start of paclitaxel  BREAST CANCER HISTORY: From the original intake note:  The patient originally presented in May 2003 when she noticed a lump in the upper inner quadrant of her right breast in September 2003.  She sought attention and had a mammogram which showed an obvious carcinoma in the upper inner quadrant of the right breast, approximately 2 cm.  There were some enlarged lymph nodes in the axilla and an FNA done showed those consistent with malignant cells, most likely an invasive ductal carcinoma.  At that point she was unsure of what to do and was referred by Dr. [name] for a discussion of her treatment options.  By biopsy, it was ER/PR negative and HER-2 was 3+.  DNA index was 1.42.  We reiterated that it was most important to have her disease surgically addressed at that point and had recommended lumpectomy and axillary nodes to be addressed with which she followed through and on 04-03-02, Dr. [name] performed a right partial mastectomy and a right axillary lymph node dissection.  Final pathology revealed a 2.4 cm. high grade, Grade III invasive ductal carcinoma with an adjacent .8 cm. also high grade invasive ductal carcinoma which was felt to represent an intramammary metastasis rather than a second primary.  A smaller mass was just medial to the larger mass.  The smaller mass was associated with high grade DCIS component.  There was no definite lymphovascular invasion identified.  However, one of fourteen axillary lymph nodes did contain a 1.5 cm. metastatic deposit.   Postoperatively she did very well.  We reiterated the fact that she did need adjuvant chemotherapy, however, she refused and decided that she would pursue radiation.  She received radiation and completed that on 08-14-02. Her subsequent history is as detailed below.   INTERVAL HISTORY: Nazanin returns today for followup of her breast cancer accompanied by her husband's [name]. Since her last visit with me I reviewed the 04/23/2015 biopsy report with the pathologist's and they did deeper cuts which indeed suggested the possibility of angiosarcoma (see amended report [phone] [id number]). I then discussed the situation with the patient's wound physician, Dr. Con Memos, and he provided additional tissue July 1. I was called by the pathologist yesterday telling me that this new biopsy shows carcinoma (namely metastatic breast cancer) but also an atypical vascular proliferation which looks to him like angiosarcoma. I requested a prognostic panel on the adenocarcinoma cells and we may eventually request a review of this complex pathology at an outside institution. --Yecenia returns today to discuss implications of this new information.   REVIEW OF SYSTEMS: The arm continues to be the big problem for her and she continues to benefit from the wound care she is receiving and aliments. She does have pain associated with this but this is well managed with her current medications. Her appetite is poor. She also has joint pain here and there but this is not more intense or persistent than before. She feels forgetful and anxious. She has hot flashes. She denies any unusual headaches, visual changes, nausea, or vomiting. A detailed review of systems today was otherwise stable  PAST MEDICAL HISTORY: Past Medical History  Diagnosis Date  . DVT (deep venous thrombosis) 10/07/2011  . Breast cancer   . Hypertension   . Rheumatic fever     PAST SURGICAL HISTORY: Past Surgical History  Procedure Laterality Date  .  Mastectomy      bilateral  . Breast surgery    . Port a cath revision      FAMILY HISTORY Family History  Problem Relation Age of Onset  . Pancreatic cancer Mother   . Kidney cancer Sister 71  . Breast cancer Sister 24  . Breast cancer Other 73  The patient's father died in his 53I  from complications of alcohol abuse. The patient's mother died in her 69s from pancreatic cancer. The patient has 6 brothers, 2 sisters. Both her sisters have had breast cancer, both diagnosed after the age of 69. The patient has not had genetic testing so far.   GYNECOLOGIC HISTORY: Menarche age 59; first live birth age 59. She is GXP4. She is not sure when she stopped having periods. She never used hormone replacement.  SOCIAL HISTORY: The patient has worked in the past as a Psychologist, counselling. At home in addition to the patient are her husband [name], originally from [place], who works as a Administrator.  Also living in the home is her son [name] and her daughter [name] (she is studying to be a Marine scientist).  The other 2 children are [name], who has a college degree in psychology and works as a Probation officer; and [name], who lives in [place] and works as a Higher education careers adviser.   ADVANCED DIRECTIVES: Not in place  HEALTH MAINTENANCE: History  Substance Use Topics  . Smoking status: Light Tobacco Smoker  . Smokeless tobacco: Never Used  . Alcohol Use: Yes     Comment: Rarely     Colonoscopy:  Not on file  PAP:  Not on file  Bone density:  Not on file  Lipid panel:  Not on file    Allergies  Allergen Reactions  . Tramadol Nausea Only  . Hydrocodone-Acetaminophen Itching and Rash    Current Outpatient Prescriptions  Medication Sig Dispense Refill  . alprazolam (XANAX) 2 MG tablet Take 2 mg by mouth daily.     . Ascorbic Acid (VITAMIN C) 100 MG tablet Take 100 mg by mouth daily.      . calcium-vitamin D (OSCAL WITH D) 500-200 MG-UNIT per tablet Take 1 tablet by mouth daily.      . cephALEXin (KEFLEX) 500  MG capsule Take 1 capsule (500 mg total) by mouth 2 (two) times daily. (Patient not taking: Reported on 04/19/2015) 20 capsule 0  . doxycycline (VIBRAMYCIN) 100 MG capsule Take 1 capsule (100 mg total) by mouth 2 (two) times daily. One po bid x 7 days 14 capsule 0  . gabapentin (NEURONTIN) 300 MG capsule Take 1 capsule (300 mg total) by mouth at bedtime. 90 capsule 4  . Multiple Vitamin (MULTIVITAMIN) capsule Take 1 capsule by mouth daily.      Marland Kitchen oxyCODONE (ROXICODONE) 15 MG immediate release tablet Take 1 tablet (15 mg total) by mouth 2 (two) times daily as needed for severe pain. 30 tablet 0  . oxymorphone (OPANA) 10 MG tablet Take 1 tablet (10 mg total) by mouth 2 (two) times daily as needed for pain. 30 tablet 0  . triamterene-hydrochlorothiazide (DYAZIDE) 37.5-25 MG per capsule TAKE 1 EACH (1 CAPSULE TOTAL) BY MOUTH EVERY MORNING. 30 capsule 2   No current facility-administered medications  for this visit.   Facility-Administered Medications Ordered in Other Visits  Medication Dose Route Frequency Provider Last Rate Last Dose  . sodium chloride 0.9 % injection 10 mL  10 mL Intravenous PRN Lowella Dell, MD   10 mL at 03/04/14 1421    OBJECTIVE: Middle-aged [place] American woman with the right upper arm in a rapid  Filed Vitals:   05/15/15 1214  BP: 116/62  Pulse: 90  Temp: 98 F (36.7 C)  Resp: 20     Body mass index is 27.14 kg/(m^2).    ECOG FS: 2 Filed Weights   05/15/15 1214  Weight: 163 lb 1.6 oz (73.982 kg)    Sclerae unicteric, pupils round and equal Oropharynx clear and moist-- no thrush or other lesions No cervical or supraclavicular adenopathy Lungs no rales or rhonchi Heart regular rate and rhythm Abd soft, nontender, positive bowel sounds MSK no focal spinal tenderness, right upper extremity lymphedema and induration with skin breakdown as before, the induration and swelling extending over the right shoulder and upper right chest area. Neuro: nonfocal, well  oriented, distraught affect Breasts: Deferred  photo right upper extremity 04/20/2015    LAB RESULTS:. Results for MERIE, WULF (MRN [id number]) as of 05/17/2015 17:02  Ref. Range 10/05/2012 14:28 05/29/2014 13:17 08/21/2014 13:43 12/08/2014 12:29 05/15/2015 11:55  CA 27.29 Latest Ref Range: 0-39 U/mL 37 29 28 30  187 (H)   Lab Results  Component Value Date   WBC 11.0* 05/15/2015   NEUTROABS 8.6* 05/15/2015   HGB 10.5* 05/15/2015   HCT 31.5* 05/15/2015   MCV 83.4 05/15/2015   PLT 387 05/15/2015      Chemistry      Component Value Date/Time   NA 140 05/04/2015 1510   NA 140 08/14/2014 0100   K 3.6 05/04/2015 1510   K 3.8 08/14/2014 0100   CL 100 08/14/2014 0100   CL 101 03/07/2013 1411   CO2 30* 05/04/2015 1510   CO2 28 08/14/2014 0100   BUN 19.1 05/04/2015 1510   BUN 16 08/14/2014 0100   CREATININE 0.8 05/04/2015 1510   CREATININE 0.78 08/14/2014 0100      Component Value Date/Time   CALCIUM 9.4 05/04/2015 1510   CALCIUM 9.5 08/14/2014 0100   ALKPHOS 129 05/04/2015 1510   ALKPHOS 159* 08/14/2014 0100   AST 24 05/04/2015 1510   AST 47* 08/14/2014 0100   ALT 15 05/04/2015 1510   ALT 35 08/14/2014 0100   BILITOT 0.23 05/04/2015 1510   BILITOT <0.2* 08/14/2014 0100       STUDIES: Mr Shoulder Right W Wo Contrast  05/12/2015   CLINICAL DATA:  Right shoulder pain and swelling for 2 months. History of right breast cancer.  EXAM: MRI OF THE RIGHT SHOULDER WITHOUT AND WITH CONTRAST  TECHNIQUE: Multiplanar, multisequence MR imaging of the shoulder was performed before and after contrast administration.  CONTRAST:  17 cc MultiHance  COMPARISON:  CT scans of the chest dated 03/19/2015 and 02/25/2014  FINDINGS: Soft tissues: There is extensive soft tissue edema in around the shoulder joint including areas of what appear to be focal edema in some of the muscles. There is thickening of the scan. There are numerous superficial varicose veins in the subcutaneous fat around the shoulder,  probably due to venous occlusion in the arm. The patency of the vessels is not well assessed on this MRI.  Rotator cuff: There is a small intrasubstance tear of the infraspinatus at the musculotendinous junction. The rotator cuff is otherwise  intact.  Muscles: No atrophy. There are multiple areas of focal high signal on T2 with and T1 weighted imaging in the muscles around the shoulder including the proximal triceps and in the proximal short head of the biceps. There is no appreciable enhancement of these areas after contrast administration.  Biceps long head:  Properly located and intact.  Acromioclavicular Joint: Type 3 acromion with inferior osteophyte on the distal clavicle which could predispose to impingement. Small subcortical cyst in the distal clavicle. There is enhancement in and around the [facility] joint after contrast administration. There is also slight enhancement in the adjacent subacromial bursa but there is no fluid in the bursa.  Glenohumeral Joint: Normal.  Labrum:  Normal.  Bones: Extra-articular bones are normal. No visible metastatic disease in the bones.  IMPRESSION: 1. AC joint arthropathy with inflammation in and around the joint which could be painful. 2. Small musculotendinous intrasubstance tear of the infraspinatus. 3. Extensive subcutaneous edema with multifocal muscle edema and numerous varicose superficial veins around the shoulder which I suspect is due to venous occlusion. The focal areas of muscle edema or unusual but there is no appreciable enhancement after contrast administration and therefore I do not think these represent tumor. 4. CT venography of the right shoulder, upper arm and upper chest may be useful for further delineation of the venous structures in the upper arm and right axilla and for further evaluation of the areas of abnormal signal in the muscles including the triceps and proximal biceps if you feel this is clinically necessary.   Electronically Signed   By: Lorriane Shire [name]   On: 05/12/2015 16:59   Patient: ALISABETH, SELKIRK Collected: 04/23/2015 Client: [facility] Accession: [id number] Received: 04/23/2015 Darene Lamer. Hoxworth, MD DOB: 1956-01-15 Age: 8 Gender: F Reported: 05/06/2015 [address] [place] Patient Ph: [phone] MRN #: [id number] [place], [place] [address] Client Acc#: Chart #: [id number] Phone: Fax: CC: Lurline Del, MD REPORT OF DERMATOPATHOLOGY FINAL DIAGNOSIS and MICROSCOPIC DESCRIPTION Diagnosis Skin , right upper arm AMENDED DIAGNOSIS: ATYPICAL VASCULAR PROLIFERATION, SEE DESCRIPTION. Microscopic Description The biopsy was re-reviewed based on additional clinical information provided, and immunohistochemical stains have been performed. This is a small punch biopsy with significant crush artifact, limiting the evaluation of the cytomorphology and the overall histologic details. There is a proliferation of spindle cells diffusely present in the dermis in a background of thickened collagen bundles with some clefting. These cells have hyperchromatic nuclei and abundant amphophilic cytoplasm. Some mitotic figures are noted. There is edema in the papillary dermis. The epidermis shows reactive changes and focal ulceration. In an attempt to determine the lineage of these cells, immunohistochemical stains are performed revealing the cells to be positive for the vascular marker CD31 and negative for cytokeratin AE1/AE3, S100, CD68 and Factor XIIIa. The changes are those of a vascular proliferation with atypical features suspicious for angiosarcoma. A larger biopsy is recommended to help further evaluate this process and confirm the diagnosis. This case was discussed with [name] on 05/05/2015. The slides have also been reviewed by Drs. Kathee Delton, who are in agreement with the diagnosis. Margette Fast MD Dermatopathologist, Electronic Signature (Case signed 05/06/2015)   ASSESSMENT:  59 y.o.  [place] woman with stage IV breast cancer manifested chiefly by loco-regional nodal disease (neck, chest), without lung, liver, bone, or brain involvement.  (1) Status post right lumpectomy and sentinel lymph node sampling 03/04/2002 for 2 separate foci of invasive ductal carcinoma, mpT2 pN1 or  stage IIB, both foci grade 3, both estrogen and progesterone receptor positive, both HercepTest 3+, Mib-1 56%  (2) Reexcision for margins 05/27/2002 showed no residual cancer in the breast.  (3) The patient refused adjuvant systemic therapy.  (4) Adjuvant radiation treatment completed 08/14/2002.  (5) recurrence in the right breast in 02/2004 showing a morphologically different tumor, again grade 3, again estrogen and progesterone receptor negative, with an MIB-1 of 14% and Herceptest 3+.  (5) Between 03/2004 and 07/2004 she received dose dense Doxorubicin/Cyclophosphamide x 4 given with trastuzumab, followed by weekly Docetaxel x 8, again given with trastuzumab.  (6) Right mastectomy 07/13/2004 showed scattered microscopic foci of residual disease over an area  greater than 5 cm. Margins were negative.  (7) Postoperative Docetaxel continued until 09/2004.  (8) Trastuzumab (Herceptin) given 08/2004 through 01/2012 with some brief interruptions.  (9) Isolated right cervical nodal recurrence 10/2005, treated with radiation to the right supraclavicular area (total 41.5 gray) completed 12/29/2005.  (10) Navelbine given together with Herceptin  11/2005 through 03/2006.  (9) Left mastectomy 02/13/2006 for ductal carcinoma in situ, grade 2, estrogen and progesterone receptor negative, with negative margins; 0 of 3 lymph nodes involved  (10) treated with Lapatinib and Capecitabine before 10/2009, for an unclear duration and with unclear results (cannot locate data on chart review).  (11) Status post right supraclavicular lymph node biopsy 09/2010 again positive for an invasive ductal carcinoma, estrogen  and progesterone receptor negative, HER-2 positive by C[facility] with a ratio 4.25.  (12) Navelbine given together with Herceptin between 05/2011 and 11/2011.  (13) Carboplatin/ Gemcitabine/ Herceptin given for 2 cycles, in 12/2011 and 01/2012.  (14) TDM-1 (Kadcyla) started 02/2012. Last dose 10/02/2013 after which the patient discontinued treatment at her own discretion. Most recent echo on December 2014 showed a well preserved ejection fraction.  (15) Deep vein thrombosis of the right upper extremity documented 04/20/2011.  She completed anticoagulant therapy with Coumadin on 03/25/2013.  (16) Chronic right upper extremity lymphedema, not responsive to aggressive PT  (a) biopsy of denuded area 04/23/2015 read as dermatofibroma (discordant)  (b) deeper cuts of 04/23/2015 biopsy suggest angiosarcoma-- final report pending  (17) Right chest port-a-cath removal due to infection on 01/28/2013. Left chest Port-A-Cath placed on 04/08/2013; being flushed every 6 weeks   PLAN: I spent approximately 50 minutes today with [name] and her husband discussing her situation. I explained that according to the verbal report that I received from pathology there appears to be some evidence of metastatic breast cancer as well as angiosarcoma.Specifically the pathologist discussed a highly atypical vascular proliferation throughout the samples. She understands angiosarcoma is different from breast cancer, is sometimes related to radiation therapy, and that its treatment and prognosis are again different from breast cancer . There is further pathology review pending but at this point what I told Amiah is that we are dealing with cancer and that we need to proceed to treatment.  Specifically I have set her up for chemotherapy with paclitaxel, which would be useful both for breast cancer and for angiosarcoma. She had a much more aggressive tach seen previously, namely docetaxel, and tolerated it well. I would expect  that she would do well with this drug and from the point of view of angiosarcoma it is the closest thing we have 2 standard of care.  However Oaklee did not accept the diagnosis. She tells me she is healed and that she does not have cancer. She does not want any chemotherapy because it made her feel  so bad last time we tried. Her husband also stated that in fact the patient was incapacitated by her prior treatment with carboplatin and gemcitabine.  That is as far as we were able to get today. I suggested that she might benefit from a second opinion and she would like to obtain that at [facility]. She tells me she is willing to drive there. I will facilitate that for her and by the time she gets seen at [facility] we should have the definitive pathology report. I also requested a prognostic panel if indeed we are dealing with breast cancer to ascertain whether it is still HER-2 positive, in which case resuming anti-HER-2 treatment might be a consideration.  I made Chalise a return appointment with me on July 22, but she knows that we'll be glad to see her before then if she wishes to discuss this further. I also set her up for an angiogram CT of the right upper extremity to better delineate the anatomy of what we are dealing with.  Chauncey Cruel, MD  05/17/2015 5:01 PM

## 2015-05-15 NOTE — Patient Instructions (Signed)

## 2015-05-16 LAB — CANCER ANTIGEN 27.29: CA 27.29: 187 U/mL — ABNORMAL HIGH (ref 0–39)

## 2015-05-18 ENCOUNTER — Other Ambulatory Visit: Payer: Self-pay | Admitting: Oncology

## 2015-05-18 NOTE — Progress Notes (Signed)
[name], [name] ([id number]) Visit Report for 05/15/2015 Chief Complaint Document Details Patient Name: [name], [name] Date of Service: 05/15/2015 3:15 PM Medical Record Patient Account Number: [id number] Number: Afful, RN, BSN, Treating RN: 06-Aug-1956 (59 y.o. [name] Date of Birth/Sex: Female) Other Clinician: Primary Care Physician: [name] Treating [name] Referring Physician: [name] Physician/Extender: Suella Grove in Treatment: 3 Information Obtained from: Patient Chief Complaint Patient presents to the wound care center for a consult due non healing wound. The patient was known to have bilateral lymphedema as a status post mastectomy syndrome now comes with an ulcerated area of her right upper arm which she's had for several weeks. Electronic Signature(s) Signed: 05/15/2015 3:42:44 PM By: [name], FACS Entered By: [name] on 05/15/2015 15:42:43 [name] ([id number]) -------------------------------------------------------------------------------- HPI Details Patient Name: [name] Date of Service: 05/15/2015 3:15 PM Medical Record Patient Account Number: [id number] Number: Afful, RN, BSN, Treating RN: 1956/03/04 (59 y.o. [name] Date of Birth/Sex: Female) Other Clinician: Primary Care Physician: [name] Treating [name] Referring Physician: [name] Physician/Extender: Suella Grove in Treatment: 3 History of Present Illness Location: bilateral arm lymphedema with now skin ulceration of the right upper extremity. Quality: Patient reports experiencing a dull pain to affected area(s). Severity: Patient states wound are getting worse. Duration: Patient has had the wound for > 3 months prior to seeking treatment at the wound center Timing: Pain in wound is Intermittent (comes and goes Context: The wound occurred when the patiento. Modifying Factors: Consults to this date include: local care and antibiotics  as prescribed by the PCP and the ER. Associated Signs and Symptoms: Patient reports having increase discharge. HPI Description: 59 y.o. [place] woman with stage IV breast cancer, originally diagnosed in April 2003 and now manifested chiefly by loco-regional nodal disease (neck, chest), without lung, liver, bone, or brain involvement. he has received extensive radiation and chemotherapy after the surgeries. She's always had bilateral lymphedema of her arms but now comes with an ulcerated area on her right upper arm posteriorly. Her medical Oncologist, Dr.Gustav [name], was concerned that what we may be seeing in [name]'s right upper extremity is actually skin spread of her cancer. This needs to be biopsied and she was sending her back to general surgery with that in mind. She placed a referral to the wound clinic . She has been on doxycycline for now. The general surgeon saw her 2 days ago and did a biopsy and this report is pending. In April 2016 she had a right upper arm venous duplex study which showed: No evidence of deep vein or superficial thrombosis involving the ooright upper extremity and left subclavian vein. Moderate edema noted throughout the right upper extremity. 05/01/2015 the patient tells me that her surgeon revealed that her biopsy was negative and that there was no pathology found. Other than that she is doing well and she thinks her edema has gone down a bit. 05/07/2015 -- I received a call from her medical oncologist Dr. Jana Hakim, who said he strongly suspects that this is going to be a sarcoma or other carcinoma and the biopsy done previously was inadequate. He requested me to take a biopsy which I agreed to do on her next visit which is today. The pathology would be sent to [facility] pathologist in [place]. 05/15/2015 -- final report is not back yet but the pathologist Dr. Chrystine Oiler did speak to me on the phone a few days ago and mentioned that he is doing final stains  and there may  be a mixture of a angiosarcoma and metastatic breast cancer. I have not been able to speak to Dr. Jana Hakim yet but I understand the patient has seen him today and he has discussed the provisional pathology and offered her some chemotherapy. Electronic Signature(s) Signed: 05/15/2015 3:44:03 PM By: [name], FACS Entered By: [name] on 05/15/2015 15:44:03 [name], [name] ([id number]) [name], [name] ([id number]) -------------------------------------------------------------------------------- Physical Exam Details Patient Name: [name] Date of Service: 05/15/2015 3:15 PM Medical Record Patient Account Number: [id number] Number: Afful, RN, BSN, Treating RN: 07-22-1956 (58 y.o. [name] Date of Birth/Sex: Female) Other Clinician: Primary Care Physician: [name] Treating [name] Referring Physician: [name] Physician/Extender: Weeks in Treatment: 3 Constitutional . Pulse regular. Respirations normal and unlabored. Afebrile. . Eyes Nonicteric. Reactive to light. Ears, Nose, Mouth, and Throat Lips, teeth, and gums WNL.Marland Kitchen Moist mucosa without lesions . Neck supple and nontender. No palpable supraclavicular or cervical adenopathy. Normal sized without goiter. Respiratory WNL. No retractions.. Cardiovascular Pedal Pulses WNL. No clubbing, cyanosis or edema. Chest Breasts symmetical and no nipple discharge.. Breast tissue WNL, no masses, lumps, or tenderness.. Lymphatic No adneopathy. No adenopathy. No adenopathy. Integumentary (Hair, Skin) No suspicious lesions. No crepitus or fluctuance. No peri-wound warmth or erythema. No masses.Marland Kitchen Psychiatric Judgement and insight Intact.. No evidence of depression, anxiety, or agitation.. Notes the wound on the right posterior upper arm along with lymphedema is about the same and we have no bleeding from the wound at the present time. Electronic Signature(s) Signed: 05/15/2015 3:44:49 PM By:  [name], FACS Entered By: [name] on 05/15/2015 15:44:49 [name] ([id number]) -------------------------------------------------------------------------------- Physician Orders Details Patient Name: [name] Date of Service: 05/15/2015 3:15 PM Medical Record Patient Account Number: [id number] Number: Afful, RN, BSN, Treating RN: 08/03/1956 (59 y.o. [name] Date of Birth/Sex: Female) Other Clinician: Primary Care Physician: [name] Treating Claudio Mondry Referring Physician: [name] Physician/Extender: Suella Grove in Treatment: 3 Verbal / Phone Orders: Yes Clinician: Afful, RN, BSN, Rita Read Back and Verified: Yes Diagnosis Coding Wound Cleansing Wound #1 Right,Posterior Upper Arm o Cleanse wound with mild soap and water o May Shower, gently pat wound dry prior to applying new dressing. o May shower with protection. Anesthetic Wound #1 Right,Posterior Upper Arm o Topical Lidocaine 4% cream applied to wound bed prior to debridement Skin Barriers/Peri-Wound Care o Barrier cream Primary Wound Dressing Wound #1 Right,Posterior Upper Arm o Aquacel Ag o Other: - carbofllex Secondary Dressing Wound #1 Right,Posterior Upper Arm o ABD and Kerlix/Conform Dressing Change Frequency Wound #1 Right,Posterior Upper Arm o Change dressing every other day. Follow-up Appointments Wound #1 Right,Posterior Upper Arm o Return Appointment in 1 week. Edema Control Wound #1 Right,Posterior Upper Arm o Other: - compression sleeve LILLI, DEWALD ([id number]) Electronic Signature(s) Signed: 05/15/2015 3:23:53 PM By: Regan Lemming BSN, RN Signed: 05/18/2015 12:34:21 PM By: [name], FACS Previous Signature: 05/15/2015 3:19:21 PM Version By: Regan Lemming BSN, RN Entered By: Regan Lemming on 05/15/2015 15:23:52 [name] ([id number]) -------------------------------------------------------------------------------- Problem List  Details Patient Name: [name] Date of Service: 05/15/2015 3:15 PM Medical Record Patient Account Number: [id number] Number: Afful, RN, BSN, Treating RN: [birth year] (59 y.o. [name] Date of Birth/Sex: Female) Other Clinician: Primary Care Physician: [name] Treating [name] Referring Physician: [name] Physician/Extender: Suella Grove in Treatment: 3 Active Problems ICD-10 Encounter Code Description Active Date Diagnosis S41.101A Unspecified open wound of right upper arm, initial 04/24/2015 Yes encounter Z85.3 Personal history of malignant neoplasm of breast 04/24/2015 Yes  I97.2 Postmastectomy lymphedema syndrome 04/24/2015 Yes Inactive Problems Resolved Problems Electronic Signature(s) Signed: 05/15/2015 3:42:33 PM By: [name], FACS Entered By: [name] on 05/15/2015 15:42:33 [name] ([id number]) -------------------------------------------------------------------------------- Progress Note Details Patient Name: [name] Date of Service: 05/15/2015 3:15 PM Medical Record Patient Account Number: [id number] Number: Afful, RN, BSN, Treating RN: [birth year] (59 y.o. [name] Date of Birth/Sex: Female) Other Clinician: Primary Care Physician: [name] Treating [name] Referring Physician: [name] Physician/Extender: Suella Grove in Treatment: 3 Subjective Chief Complaint Information obtained from Patient Patient presents to the wound care center for a consult due non healing wound. The patient was known to have bilateral lymphedema as a status post mastectomy syndrome now comes with an ulcerated area of her right upper arm which she's had for several weeks. History of Present Illness (HPI) The following HPI elements were documented for the patient's wound: Location: bilateral arm lymphedema with now skin ulceration of the right upper extremity. Quality: Patient reports experiencing a dull pain to  affected area(s). Severity: Patient states wound are getting worse. Duration: Patient has had the wound for > 3 months prior to seeking treatment at the wound center Timing: Pain in wound is Intermittent (comes and goes Context: The wound occurred when the patient . Modifying Factors: Consults to this date include: local care and antibiotics as prescribed by the PCP and the ER. Associated Signs and Symptoms: Patient reports having increase discharge. 59 y.o. [place] woman with stage IV breast cancer, originally diagnosed in April 2003 and now manifested chiefly by loco-regional nodal disease (neck, chest), without lung, liver, bone, or brain involvement. he has received extensive radiation and chemotherapy after the surgeries. She's always had bilateral lymphedema of her arms but now comes with an ulcerated area on her right upper arm posteriorly. Her medical Oncologist, Dr.Gustav [name], was concerned that what we may be seeing in Suann's right upper extremity is actually skin spread of her cancer. This needs to be biopsied and she was sending her back to general surgery with that in mind. She placed a referral to the wound clinic . She has been on doxycycline for now. The general surgeon saw her 2 days ago and did a biopsy and this report is pending. In April 2016 she had a right upper arm venous duplex study which showed: No evidence of deep vein or superficial thrombosis involving the right upper extremity and left subclavian vein. Moderate edema noted throughout the right upper extremity. 05/01/2015 the patient tells me that her surgeon revealed that her biopsy was negative and that there was no pathology found. Other than that she is doing well and she thinks her edema has gone down a bit. 05/07/2015 -- I received a call from her medical oncologist Dr. Jana Hakim, who said he strongly suspects that this is going to be a sarcoma or other carcinoma and the biopsy done previously was  inadequate. He requested me to take a biopsy which I agreed to do on her next visit which is today. The pathology would be sent to [facility] pathologist in [place]. ARLI, BREE ([id number]) 05/15/2015 -- final report is not back yet but the pathologist Dr. Chrystine Oiler did speak to me on the phone a few days ago and mentioned that he is doing final stains and there may be a mixture of a angiosarcoma and metastatic breast cancer. I have not been able to speak to Dr. Jana Hakim yet but I understand the patient has seen him today and he has discussed the provisional  pathology and offered her some chemotherapy. Objective Constitutional Pulse regular. Respirations normal and unlabored. Afebrile. Vitals Time Taken: 2:54 PM, Height: 65 in, Weight: 168 lbs, BMI: 28, Temperature: 98.5 F, Pulse: 88 bpm, Respiratory Rate: 17 breaths/min, Blood Pressure: 97/60 mmHg. Eyes Nonicteric. Reactive to light. Ears, Nose, Mouth, and Throat Lips, teeth, and gums WNL.Marland Kitchen Moist mucosa without lesions . Neck supple and nontender. No palpable supraclavicular or cervical adenopathy. Normal sized without goiter. Respiratory WNL. No retractions.. Cardiovascular Pedal Pulses WNL. No clubbing, cyanosis or edema. Chest Breasts symmetical and no nipple discharge.. Breast tissue WNL, no masses, lumps, or tenderness.. Lymphatic No adneopathy. No adenopathy. No adenopathy. Psychiatric Judgement and insight Intact.. No evidence of depression, anxiety, or agitation.. General Notes: the wound on the right posterior upper arm along with lymphedema is about the same and we have no bleeding from the wound at the present time. Integumentary (Hair, Skin) No suspicious lesions. No crepitus or fluctuance. No peri-wound warmth or erythema. No masses.Hassell Done, [name] ([id number]) Wound #1 status is Open. Original cause of wound was Gradually Appeared. The wound is located on the Right,Posterior Upper Arm. The wound measures 13cm length x  21.5cm width x 0.2cm depth; 219.519cm^2 area and 43.904cm^3 volume. The wound is limited to skin breakdown. There is no tunneling or undermining noted. There is a small amount of serosanguineous drainage noted. The wound margin is distinct with the outline attached to the wound base. There is small (1-33%) pink, pale granulation within the wound bed. There is a large (67-100%) amount of necrotic tissue within the wound bed including Adherent Slough. The periwound skin appearance exhibited: Localized Edema, Moist. The periwound skin appearance did not exhibit: Callus, Crepitus, Excoriation, Fluctuance, Friable, Induration, Rash, Scarring, Dry/Scaly, Maceration, Atrophie Blanche, Cyanosis, Ecchymosis, Hemosiderin Staining, Mottled, Pallor, Rubor, Erythema. Assessment Active Problems ICD-10 S41.101A - Unspecified open wound of right upper arm, initial encounter Z85.3 - Personal history of malignant neoplasm of breast I97.2 - Postmastectomy lymphedema syndrome We will continue to treat her with silver alginate and carboflex and a light bandage. She was having some problem with the supplies but we will try and make a call to the company to see what is the problem. Come back and see me next week. Plan Wound Cleansing: Wound #1 Right,Posterior Upper Arm: Cleanse wound with mild soap and water May Shower, gently pat wound dry prior to applying new dressing. May shower with protection. Anesthetic: Wound #1 Right,Posterior Upper Arm: Topical Lidocaine 4% cream applied to wound bed prior to debridement Skin Barriers/Peri-Wound Care: Barrier cream Primary Wound Dressing: Wound #1 Right,Posterior Upper Arm: Aquacel Ag Other: - carbofllex Secondary Dressing: Lingle, Rowynn ([id number]) Wound #1 Right,Posterior Upper Arm: ABD and Kerlix/Conform Dressing Change Frequency: Wound #1 Right,Posterior Upper Arm: Change dressing every other day. Follow-up Appointments: Wound #1 Right,Posterior Upper  Arm: Return Appointment in 1 week. Edema Control: Wound #1 Right,Posterior Upper Arm: Other: - compression sleeve We will continue to treat her with silver alginate and carboflex and a light bandage. She was having some problem with the supplies but we will try and make a call to the company to see what is the problem. Come back and see me next week. Electronic Signature(s) Signed: 05/15/2015 3:46:06 PM By: [name], FACS Entered By: [name] on 05/15/2015 15:46:06 [name] ([id number]) -------------------------------------------------------------------------------- SuperBill Details Patient Name: [name] Date of Service: 05/15/2015 Medical Record Patient Account Number: [id number] Number: Afful, RN, BSN, Treating RN: Oct 07, 1956 (59 y.o. [name] Date of Birth/Sex: Female) Other  Clinician: Primary Care Physician: [name] Treating [name] Referring Physician: [name] Physician/Extender: Suella Grove in Treatment: 3 Diagnosis Coding ICD-10 Codes Code Description C37.628B Unspecified open wound of right upper arm, initial encounter Z85.3 Personal history of malignant neoplasm of breast I97.2 Postmastectomy lymphedema syndrome Facility Procedures CPT4 Code: 15176160 Description: [phone] - WOUND CARE VISIT-LEV 2 EST PT Modifier: Quantity: 1 Physician Procedures CPT4 Code: 6269485 Description: 46270 - WC PHYS LEVEL 3 - EST PT ICD-10 Description Diagnosis S41.101A Unspecified open wound of right upper arm, initi Z85.3 Personal history of malignant neoplasm of breast I97.2 Postmastectomy lymphedema syndrome Modifier: al encounter Quantity: 1 Electronic Signature(s) Signed: 05/15/2015 3:46:24 PM By: [name], FACS Previous Signature: 05/15/2015 3:27:42 PM Version By: Regan Lemming BSN, RN Entered By: [name] on 05/15/2015 15:46:23

## 2015-05-19 ENCOUNTER — Telehealth: Payer: Self-pay | Admitting: Oncology

## 2015-05-19 ENCOUNTER — Encounter (HOSPITAL_BASED_OUTPATIENT_CLINIC_OR_DEPARTMENT_OTHER): Payer: Commercial Managed Care - PPO | Attending: General Surgery

## 2015-05-19 NOTE — Telephone Encounter (Signed)
Faxed medical records Attn: Tonja to Dr. Janan Halter [phone].  Per Joen Laura will contact patient with appt.

## 2015-05-21 ENCOUNTER — Other Ambulatory Visit: Payer: Self-pay | Admitting: Oncology

## 2015-05-22 ENCOUNTER — Encounter: Payer: Commercial Managed Care - PPO | Admitting: Surgery

## 2015-05-22 DIAGNOSIS — S41101A Unspecified open wound of right upper arm, initial encounter: Secondary | ICD-10-CM | POA: Diagnosis not present

## 2015-05-22 DIAGNOSIS — Z853 Personal history of malignant neoplasm of breast: Secondary | ICD-10-CM | POA: Diagnosis not present

## 2015-05-22 DIAGNOSIS — I972 Postmastectomy lymphedema syndrome: Secondary | ICD-10-CM | POA: Diagnosis not present

## 2015-05-22 DIAGNOSIS — C50919 Malignant neoplasm of unspecified site of unspecified female breast: Secondary | ICD-10-CM | POA: Diagnosis not present

## 2015-05-22 NOTE — Progress Notes (Addendum)
ZAWADI, APLIN ([id number]) Visit Report for 05/22/2015 Chief Complaint Document Details Patient Name: [name] Date of Service: 05/22/2015 3:00 PM Medical Record Number: [id number] Patient Account Number: [id number] Date of Birth/Sex: [birth year] (59 y.o. Female) Treating RN: Primary Care Physician: [name] Other Clinician: Referring Physician: [name] Treating Physician/Extender: Frann Rider in Treatment: 4 Information Obtained from: Patient Chief Complaint Patient presents to the wound care center for a consult due non healing wound. The patient was known to have bilateral lymphedema as a status post mastectomy syndrome now comes with an ulcerated area of her right upper arm which she's had for several weeks. Electronic Signature(s) Signed: 05/22/2015 3:47:40 PM By: [name], FACS Entered By: [name] on 05/22/2015 15:47:39 [name] ([id number]) -------------------------------------------------------------------------------- HPI Details Patient Name: [name] Date of Service: 05/22/2015 3:00 PM Medical Record Number: [id number] Patient Account Number: [id number] Date of Birth/Sex: 1956/04/09 (59 y.o. Female) Treating RN: Primary Care Physician: [name] Other Clinician: Referring Physician: [name] Treating Physician/Extender: Frann Rider in Treatment: 4 History of Present Illness Location: bilateral arm lymphedema with now skin ulceration of the right upper extremity. Quality: Patient reports experiencing a dull pain to affected area(s). Severity: Patient states wound are getting worse. Duration: Patient has had the wound for > 3 months prior to seeking treatment at the wound center Timing: Pain in wound is Intermittent (comes and goes Context: The wound occurred when the patiento. Modifying Factors: Consults to this date include: local care and antibiotics as prescribed by the PCP and the  ER. Associated Signs and Symptoms: Patient reports having increase discharge. HPI Description: 59 y.o. [place] woman with stage IV breast cancer, originally diagnosed in April 2003 and now manifested chiefly by loco-regional nodal disease (neck, chest), without lung, liver, bone, or brain involvement. he has received extensive radiation and chemotherapy after the surgeries. She's always had bilateral lymphedema of her arms but now comes with an ulcerated area on her right upper arm posteriorly. Her medical Oncologist, Dr.Gustav [name], was concerned that what we may be seeing in Weda's right upper extremity is actually skin spread of her cancer. This needs to be biopsied and she was sending her back to general surgery with that in mind. She placed a referral to the wound clinic . She has been on doxycycline for now. The general surgeon saw her 2 days ago and did a biopsy and this report is pending. In April 2016 she had a right upper arm venous duplex study which showed: No evidence of deep vein or superficial thrombosis involving the ooright upper extremity and left subclavian vein. Moderate edema noted throughout the right upper extremity. 05/01/2015 the patient tells me that her surgeon revealed that her biopsy was negative and that there was no pathology found. Other than that she is doing well and she thinks her edema has gone down a bit. 05/07/2015 -- I received a call from her medical oncologist Dr. Jana Hakim, who said he strongly suspects that this is going to be a sarcoma or other carcinoma and the biopsy done previously was inadequate. He requested me to take a biopsy which I agreed to do on her next visit which is today. The pathology would be sent to [facility] pathologist in [place]. 05/15/2015 -- final report is not back yet but the pathologist Dr. Chrystine Oiler did speak to me on the phone a few days ago and mentioned that he is doing final stains and there may be a mixture of a  angiosarcoma and metastatic  breast cancer. I have not been able to speak to Dr. Jana Hakim yet but I understand the patient has seen him today and he has discussed the provisional pathology and offered her some chemotherapy. 05/22/2015 --The final pathology report is back and it shows a metastatic breast carcinoma and an atypical vascular proliferation. 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 She informs me that she is going to [place] to the [facility] for further care. Electronic Signature(s) MERCEDES, FORT ([id number]) Signed: 05/22/2015 3:48:05 PM By: [name], FACS Previous Signature: 05/22/2015 11:34:27 AM Version By: [name], FACS Entered By: [name] on 05/22/2015 15:48:04 [name] ([id number]) -------------------------------------------------------------------------------- Physical Exam Details Patient Name: [name] Date of Service: 05/22/2015 3:00 PM Medical Record Number: [id number] Patient Account Number: [id number] Date of Birth/Sex: [birth year] (59 y.o. Female) Treating RN: Primary Care Physician: [name] Other Clinician: Referring Physician: [name] Treating Physician/Extender: Frann Rider in Treatment: 4 Constitutional . Pulse regular. Respirations normal and unlabored. Afebrile. . Eyes Nonicteric. Reactive to light. Ears, Nose, Mouth, and Throat Lips, teeth, and gums WNL.Marland Kitchen Moist mucosa without lesions . Neck supple and nontender. No palpable supraclavicular or cervical adenopathy. Normal sized without goiter. Respiratory WNL. No retractions.. Cardiovascular Pedal Pulses WNL. No clubbing, cyanosis or edema. Chest Breasts symmetical and no nipple discharge.. massive lymphedema of the right upper extremity and arm and extensive excoriation and ulceration of the posterior part of her right upper arm.. Musculoskeletal Adexa without tenderness or enlargement.. Digits and nails w/o clubbing,  cyanosis, infection, petechiae, ischemia, or inflammatory conditions.. Integumentary (Hair, Skin) No suspicious lesions. No crepitus or fluctuance. No peri-wound warmth or erythema. No masses.Marland Kitchen Psychiatric Judgement and insight Intact.. No evidence of depression, anxiety, or agitation.. Notes the wound in the right posterior upper arm continues to have some slough and order and we will continue palliative local care. Electronic Signature(s) Signed: 05/22/2015 3:49:06 PM By: [name], FACS Entered By: [name] on 05/22/2015 15:49:05 [name] ([id number]) -------------------------------------------------------------------------------- Physician Orders Details Patient Name: [name] Date of Service: 05/22/2015 3:00 PM Medical Record Patient Account Number: [id number] Number: Afful, RN, BSN, Treating RN: 1956-07-06 (59 y.o. Velva Harman Date of Birth/Sex: Female) Other Clinician: Primary Care Physician: [name] Treating Montario Zilka Referring Physician: [name] Physician/Extender: Suella Grove in Treatment: 4 Verbal / Phone Orders: Yes Clinician: Afful, RN, BSN, Rita Read Back and Verified: Yes Diagnosis Coding Wound Cleansing Wound #1 Right,Posterior Upper Arm o Cleanse wound with mild soap and water o May Shower, gently pat wound dry prior to applying new dressing. o May shower with protection. Primary Wound Dressing Wound #1 Right,Posterior Upper Arm o Aquacel Ag Secondary Dressing Wound #1 Right,Posterior Upper Arm o ABD and Kerlix/Conform Dressing Change Frequency Wound #1 Right,Posterior Upper Arm o Change dressing every other day. Follow-up Appointments Wound #1 Right,Posterior Upper Arm o Return Appointment in 1 week. Notes Carboflex for odour control Electronic Signature(s) Signed: 05/22/2015 4:49:41 PM By: Regan Lemming BSN, RN Signed: 05/25/2015 12:32:38 PM By: [name], FACS Previous Signature:  05/22/2015 4:32:00 PM Version By: [name], FACS Entered By: Regan Lemming on 05/22/2015 16:46:32 [name] ([id number]) -------------------------------------------------------------------------------- Problem List Details Patient Name: [name] Date of Service: 05/22/2015 3:00 PM Medical Record Number: [id number] Patient Account Number: [id number] Date of Birth/Sex: 03-Jun-1956 (59 y.o. 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 Female) Treating RN: Primary Care Physician: [name] Other Clinician: Referring Physician: [name] Treating Physician/Extender: Frann Rider in Treatment: 4 Subjective Chief Complaint Information obtained from Patient Patient presents to the wound care center for a consult due non healing wound. The patient was known to have bilateral lymphedema as a status post mastectomy syndrome now comes with an ulcerated area of her right upper arm which she's had for several weeks. History of Present Illness (HPI) The following HPI elements were  documented for the patient's wound: Location: bilateral arm lymphedema with now skin ulceration of the right upper extremity. Quality: Patient reports experiencing a dull pain to affected area(s). Severity: Patient states wound are getting worse. Duration: Patient has had the wound for > 3 months prior to seeking treatment at the wound center Timing: Pain in wound is Intermittent (comes and goes Context: The wound occurred when the patient . Modifying Factors: Consults to this date include: local care and antibiotics as prescribed by the PCP and the ER. Associated Signs and Symptoms: Patient reports having increase discharge. 59 y.o. [place] woman with stage IV breast cancer, originally diagnosed in April 2003 and now manifested chiefly by loco-regional nodal disease (neck, chest), without lung, liver, bone, or brain involvement. he has received extensive radiation and chemotherapy after the surgeries. She's always had bilateral lymphedema of her arms but now comes with an ulcerated area on her right upper arm posteriorly. Her medical Oncologist, Dr.Gustav [name], was concerned that what we may be seeing in Nara's right upper extremity is actually skin spread of her cancer. This needs to be biopsied and she was sending her back to general surgery with that in mind. She placed a referral to the wound clinic . She has been on doxycycline for now. The general surgeon saw her 2 days ago and did a biopsy and this report is pending. In April 2016 she had a right upper arm venous duplex study which showed: No evidence of deep vein or superficial thrombosis involving the right upper extremity and left subclavian vein. Moderate edema noted throughout the right upper extremity. 05/01/2015 the patient tells me that her surgeon revealed that her biopsy was negative and that there was no pathology found. Other than that she is doing well and she thinks her edema has gone down a bit. 05/07/2015 --  I received a call from her medical oncologist Dr. Jana Hakim, who said he strongly suspects that this is going to be a sarcoma or other carcinoma and the biopsy done previously was inadequate. He requested me to take a biopsy which I agreed to do on her next visit which is today. The pathology would be sent to [facility] pathologist in [place]. 05/15/2015 -- final report is not back yet but the pathologist Dr. Chrystine Oiler did speak to me on the phone a few days ago and mentioned that he is doing final stains and there may be a mixture of a angiosarcoma and [place], [place] ([id number]) metastatic breast cancer. I have not been able to speak to  Dr. Jana Hakim yet but I understand the patient has seen him today and he has discussed the provisional pathology and offered her some chemotherapy. 05/22/2015 --The final pathology report is back and it shows a metastatic breast carcinoma and an atypical vascular proliferation. 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 She informs me that she is going to [place] to the [facility] for further care. Objective Constitutional Pulse regular. Respirations normal and unlabored. Afebrile. Vitals Time Taken: 3:11 PM, Height: 65 in, Weight: 168 lbs, BMI: 28, Temperature: 98.7 F, Pulse: 90 bpm, Respiratory Rate: 18 breaths/min, Blood Pressure: 127/70 mmHg. Eyes Nonicteric. Reactive to light. Ears, Nose, Mouth, and Throat Lips, teeth, and gums WNL.Marland Kitchen Moist mucosa without lesions . Neck supple and nontender. No palpable supraclavicular or cervical adenopathy. Normal sized without goiter. Respiratory WNL. No retractions.. Cardiovascular Pedal Pulses WNL. No clubbing, cyanosis or edema. Chest Breasts symmetical and no nipple discharge.. massive lymphedema of the right upper extremity and arm and extensive excoriation and ulceration of the posterior part of her right upper arm.. Musculoskeletal Adexa without tenderness or enlargement.. Digits and nails  w/o clubbing, cyanosis, infection, petechiae, ischemia, or inflammatory conditions.Marland Kitchen Psychiatric Judgement and insight Intact.. No evidence of depression, anxiety, or agitation.. General Notes: the wound in the right posterior upper arm continues to have some slough and order and we will continue palliative local care. WITNEY, HUIE ([id number]) Integumentary (Hair, Skin) No suspicious lesions. No crepitus or fluctuance. No peri-wound warmth or erythema. No masses.. Wound #1 status is Open. Original cause of wound was Gradually Appeared. The wound is located on the Right,Posterior Upper Arm. The wound measures 15cm length x 23.5cm width x 0.2cm depth; 276.853cm^2 area and 55.371cm^3 volume. The wound is limited to skin breakdown. There is no undermining noted. There is a small amount of purulent drainage noted. The wound margin is distinct with the outline attached to the wound base. There is small (1-33%) pink, pale granulation within the wound bed. There is a large (67-100%) amount of necrotic tissue within the wound bed including Adherent Slough. The periwound skin appearance exhibited: Localized Edema, Moist. The periwound skin appearance did not exhibit: Callus, Crepitus, Excoriation, Fluctuance, Friable, Induration, Rash, Scarring, Dry/Scaly, Maceration, Atrophie Blanche, Cyanosis, Ecchymosis, Hemosiderin Staining, Mottled, Pallor, Rubor, Erythema. Assessment Active Problems ICD-10 S41.101A - Unspecified open wound of right upper arm, initial encounter Z85.3 - Personal history of malignant neoplasm of breast I97.2 - Postmastectomy lymphedema syndrome We will continue to treat her with silver alginate and carboflex and a light bandage. She has received a supplies. Come back and see me next week. Plan Wound Cleansing: Wound #1 Right,Posterior Upper Arm: Cleanse wound with mild soap and water May Shower, gently pat wound dry prior to applying new dressing. May shower with  protection. Primary Wound Dressing: Wound #1 Right,Posterior Upper Arm: Aquacel Ag Secondary Dressing: Wound #1 Right,Posterior Upper Arm: ABD and Kerlix/Conform Dressing Change Frequency: Wound #1 Right,Posterior Upper Arm: [place], [place] ([id number]) Change dressing every other day. Follow-up Appointments: Wound #1 Right,Posterior Upper Arm: Return Appointment in 1 week. General Notes: Carboflex for odour control We will continue to treat her with silver alginate and carboflex and a light bandage. She has received a supplies. Come back and see me next week. Electronic Signature(s) Signed: 05/26/2015 4:05:39 PM By: [name], FACS Previous Signature: 05/22/2015 3:50:24 PM Version By: [name], FACS Previous Signature: 05/22/2015 3:50:06 PM Version By: [name], FACS Entered By: [name] on 05/26/2015 16:05:39 [name] ([id number]) --------------------------------------------------------------------------------  SuperBill Details Patient Name: CYRILLA, DURKIN Date of Service: 05/22/2015 Medical Record Number: [id number] Patient Account Number: [id number] Date of Birth/Sex: 07-Jul-1956 (59 y.o. Female) Treating RN: Primary Care Physician: [name] Other Clinician: Referring Physician: [name] Treating Physician/Extender: Frann Rider in Treatment: 4 Diagnosis Coding ICD-10 Codes Code Description S41.101A Unspecified open wound of right upper arm, initial encounter Z85.3 Personal history of malignant neoplasm of breast I97.2 Postmastectomy lymphedema syndrome Facility Procedures CPT4 Code: [id number] Description: [phone] - WOUND CARE VISIT-LEV 2 EST PT Modifier: Quantity: 1 Physician Procedures CPT4 Code: 0881103 Description: 15945 - WC PHYS LEVEL 3 - EST PT ICD-10 Description Diagnosis S41.101A Unspecified open wound of right upper arm, initi Z85.3 Personal history of malignant neoplasm of breast I97.2 Postmastectomy lymphedema  syndrome Modifier: al encounter Quantity: 1 Electronic Signature(s) Signed: 05/22/2015 4:49:41 PM By: Regan Lemming BSN, RN Signed: 05/25/2015 12:32:38 PM By: [name], FACS Previous Signature: 05/22/2015 3:50:40 PM Version By: [name], FACS Entered By: Regan Lemming on 05/22/2015 16:47:20

## 2015-05-23 NOTE — Progress Notes (Signed)
[name], [name] ([id number]) Visit Report for 05/22/2015 Arrival Information Details Patient Name: [name], [name] Date of Service: 05/22/2015 3:00 PM Medical Record Number: [id number] Patient Account Number: [id number] Date of Birth/Sex: [birth year] (59 y.o. Female) Treating RN: Afful, RN, BSN, Velva Harman Primary Care Physician: [name] Other Clinician: Referring Physician: [name] Treating Physician/Extender: Frann Rider in Treatment: 4 Visit Information History Since Last Visit Any new allergies or adverse reactions: No Patient Arrived: Ambulatory Had a fall or experienced change in No Arrival Time: 15:10 activities of daily living that may affect Accompanied By: self risk of falls: Transfer Assistance: None Signs or symptoms of abuse/neglect since last No Patient Identification Verified: Yes visito Secondary Verification Process Yes Hospitalized since last visit: No Completed: Has Dressing in Place as Prescribed: Yes Patient Requires Transmission-Based No Pain Present Now: No Precautions: Patient Has Alerts: No Electronic Signature(s) Signed: 05/22/2015 4:49:41 PM By: Regan Lemming BSN, RN Entered By: Regan Lemming on 05/22/2015 15:10:56 [name] ([id number]) -------------------------------------------------------------------------------- Clinic Level of Care Assessment Details Patient Name: [name] Date of Service: 05/22/2015 3:00 PM Medical Record Number: [id number] Patient Account Number: [id number] Date of Birth/Sex: 04/17/1956 (59 y.o. Female) Treating RN: Afful, RN, BSN, [place] Primary Care Physician: [name] Other Clinician: Referring Physician: [name] Treating Physician/Extender: Frann Rider in Treatment: 4 Clinic Level of Care Assessment Items TOOL 4 Quantity Score []  - Use when only an EandM is performed on FOLLOW-UP visit 0 ASSESSMENTS - Nursing Assessment / Reassessment X - Reassessment of Co-morbidities  (includes updates in patient status) 1 10 X - Reassessment of Adherence to Treatment Plan 1 5 ASSESSMENTS - Wound and Skin Assessment / Reassessment X - Simple Wound Assessment / Reassessment - one wound 1 5 []  - Complex Wound Assessment / Reassessment - multiple wounds 0 []  - Dermatologic / Skin Assessment (not related to wound area) 0 ASSESSMENTS - Focused Assessment []  - Circumferential Edema Measurements - multi extremities 0 []  - Nutritional Assessment / Counseling / Intervention 0 []  - Lower Extremity Assessment (monofilament, tuning fork, pulses) 0 []  - Peripheral Arterial Disease Assessment (using hand held doppler) 0 ASSESSMENTS - Ostomy and/or Continence Assessment and Care []  - Incontinence Assessment and Management 0 []  - Ostomy Care Assessment and Management (repouching, etc.) 0 PROCESS - Coordination of Care X - Simple Patient / Family Education for ongoing care 1 15 []  - Complex (extensive) Patient / Family Education for ongoing care 0 []  - Staff obtains Programmer, systems, Records, Test Results / Process Orders 0 []  - Staff telephones HHA, Nursing Homes / Clarify orders / etc 0 []  - Routine Transfer to another Facility (non-emergent condition) 0 [name], [name] ([id number]) []  - Routine Hospital Admission (non-emergent condition) 0 []  - New Admissions / Biomedical engineer / Ordering NPWT, Apligraf, etc. 0 []  - Emergency Hospital Admission (emergent condition) 0 X - Simple Discharge Coordination 1 10 []  - Complex (extensive) Discharge Coordination 0 PROCESS - Special Needs []  - Pediatric / Minor Patient Management 0 []  - Isolation Patient Management 0 []  - Hearing / Language / Visual special needs 0 []  - Assessment of Community assistance (transportation, D/C planning, etc.) 0 []  - Additional assistance / Altered mentation 0 []  - Support Surface(s) Assessment (bed, cushion, seat, etc.) 0 INTERVENTIONS - Wound Cleansing / Measurement X - Simple Wound Cleansing - one wound 1  5 []  - Complex Wound Cleansing - multiple wounds 0 X - Wound Imaging (photographs - any number of wounds) 1 5 []  - Wound Tracing (instead of photographs) 0  X - Simple Wound Measurement - one wound 1 5 []  - Complex Wound Measurement - multiple wounds 0 INTERVENTIONS - Wound Dressings X - Small Wound Dressing one or multiple wounds 1 10 []  - Medium Wound Dressing one or multiple wounds 0 []  - Large Wound Dressing one or multiple wounds 0 []  - Application of Medications - topical 0 []  - Application of Medications - injection 0 INTERVENTIONS - Miscellaneous []  - External ear exam 0 [name], [name] ([id number]) []  - Specimen Collection (cultures, biopsies, blood, body fluids, etc.) 0 []  - Specimen(s) / Culture(s) sent or taken to Lab for analysis 0 []  - Patient Transfer (multiple staff / Harrel Lemon Lift / Similar devices) 0 []  - Simple Staple / Suture removal (25 or less) 0 []  - Complex Staple / Suture removal (26 or more) 0 []  - Hypo / Hyperglycemic Management (close monitor of Blood Glucose) 0 []  - Ankle / Brachial Index (ABI) - do not check if billed separately 0 X - Vital Signs 1 5 Has the patient been seen at the hospital within the last three years: Yes Total Score: 75 Level Of Care: New/Established - Level 2 Electronic Signature(s) Signed: 05/22/2015 4:49:41 PM By: Regan Lemming BSN, RN Entered By: Regan Lemming on 05/22/2015 16:47:00 [name] ([id number]) -------------------------------------------------------------------------------- Encounter Discharge Information Details Patient Name: [name] Date of Service: 05/22/2015 3:00 PM Medical Record Number: [id number] Patient Account Number: [id number] Date of Birth/Sex: Jan 22, 1956 (59 y.o. Female) Treating RN: Baruch Gouty, RN, BSN, Velva Harman Primary Care Physician: [name] Other Clinician: Referring Physician: [name] Treating Physician/Extender: Frann Rider in Treatment: 4 Encounter Discharge Information  Items Discharge Pain Level: 0 Discharge Condition: Stable Ambulatory Status: Ambulatory Discharge Destination: Home Private Transportation: Auto Accompanied By: self Schedule Follow-up Appointment: No Medication Reconciliation completed and No provided to Patient/Care Draycen Leichter: Clinical Summary of Care: Electronic Signature(s) Signed: 05/22/2015 4:49:41 PM By: Regan Lemming BSN, RN Entered By: Regan Lemming on 05/22/2015 16:47:48 [name] ([id number]) -------------------------------------------------------------------------------- Lower Extremity Assessment Details Patient Name: [name] Date of Service: 05/22/2015 3:00 PM Medical Record Number: [id number] Patient Account Number: [id number] Date of Birth/Sex: [birth year] (59 y.o. Female) Treating RN: Baruch Gouty, RN, BSN, Velva Harman Primary Care Physician: [name] Other Clinician: Referring Physician: [name] Treating Physician/Extender: Frann Rider in Treatment: 4 Electronic Signature(s) Signed: 05/22/2015 4:49:41 PM By: Regan Lemming BSN, RN Entered By: Regan Lemming on 05/22/2015 15:11:37 [name] ([id number]) -------------------------------------------------------------------------------- Multi Wound Chart Details Patient Name: [name] Date of Service: 05/22/2015 3:00 PM Medical Record Number: [id number] Patient Account Number: [id number] Date of Birth/Sex: [birth year] (59 y.o. Female) Treating RN: Baruch Gouty, RN, BSN, Velva Harman Primary Care Physician: [name] Other Clinician: Referring Physician: [name] Treating Physician/Extender: Frann Rider in Treatment: 4 Vital Signs Height(in): 65 Pulse(bpm): 90 Weight(lbs): 168 Blood Pressure 127/70 (mmHg): Body Mass Index(BMI): 28 Temperature(F): 98.7 Respiratory Rate 18 (breaths/min): Photos: [1:No Photos] [N/A:N/A] Wound Location: [1:Right Upper Arm - Posterior] [N/A:N/A] Wounding Event: [1:Gradually Appeared]  [N/A:N/A] Primary Etiology: [1:Lymphedema] [N/A:N/A] Comorbid History: [1:Deep Vein Thrombosis, Hypertension, Received Chemotherapy, Received Radiation] [N/A:N/A] Date Acquired: [1:03/23/2015] [N/A:N/A] Weeks of Treatment: [1:4] [N/A:N/A] Wound Status: [1:Open] [N/A:N/A] Measurements L x W x D 15x23.5x0.2 [N/A:N/A] (cm) Area (cm) : [3:009.233] [N/A:N/A] Volume (cm) : [1:55.371] [N/A:N/A] % Reduction in Area: [1:-74.10%] [N/A:N/A] % Reduction in Volume: -74.10% [N/A:N/A] Classification: [1:Partial Thickness] [N/A:N/A] Exudate Amount: [1:Small] [N/A:N/A] Exudate Type: [1:Purulent] [N/A:N/A] Exudate Color: [1:yellow, brown, green] [N/A:N/A] Foul Odor After [1:Yes] [N/A:N/A] Cleansing: Odor Anticipated Due to No [N/A:N/A] Product Use: Wound Margin: [  1:Distinct, outline attached] [N/A:N/A] Granulation Amount: [1:Small (1-33%)] [N/A:N/A] Granulation Quality: [1:Pink, Pale] [N/A:N/A] Necrotic Amount: [1:Large (67-100%)] [N/A:N/A] Exposed Structures: Fascia: No N/A N/A Fat: No Tendon: No Muscle: No Joint: No Bone: No Limited to Skin Breakdown Epithelialization: Small (1-33%) N/A N/A Periwound Skin Texture: Edema: Yes N/A N/A Excoriation: No Induration: No Callus: No Crepitus: No Fluctuance: No Friable: No Rash: No Scarring: No Periwound Skin Moist: Yes N/A N/A Moisture: Maceration: No Dry/Scaly: No Periwound Skin Color: Atrophie Blanche: No N/A N/A Cyanosis: No Ecchymosis: No Erythema: No Hemosiderin Staining: No Mottled: No Pallor: No Rubor: No Tenderness on No N/A N/A Palpation: Wound Preparation: Ulcer Cleansing: N/A N/A Rinsed/Irrigated with Saline Topical Anesthetic Applied: Other: lidocaine 4% Treatment Notes Electronic Signature(s) Signed: 05/22/2015 4:49:41 PM By: Regan Lemming BSN, RN Entered By: Regan Lemming on 05/22/2015 15:15:06 [name]  ([id number]) -------------------------------------------------------------------------------- [place] Details Patient Name: [name] Date of Service: 05/22/2015 3:00 PM Medical Record Number: [id number] Patient Account Number: [id number] Date of Birth/Sex: 17-Apr-1956 (59 y.o. Female) Treating RN: Afful, RN, BSN, Velva Harman Primary Care Physician: [name] Other Clinician: Referring Physician: [name] Treating Physician/Extender: Frann Rider in Treatment: 4 Active Inactive Malignancy/Atypical Etiology Nursing Diagnoses: Knowledge deficit related to disease process and management of atypical ulcer etiology Goals: Patient/caregiver will verbalize understanding of disease process and disease management of atypical ulcer etiology Date Initiated: 04/24/2015 Goal Status: Active Interventions: Provide education on atypical ulcer etiologies Treatment Activities: Biopsy : 05/08/2015 Notes: Orientation to the Wound Care Program Nursing Diagnoses: Knowledge deficit related to the wound healing center program Goals: Patient/caregiver will verbalize understanding of the [place] Program Date Initiated: 04/24/2015 Goal Status: Active Interventions: Provide education on orientation to the wound center Notes: Wound/Skin Impairment Nursing Diagnoses: Impaired tissue integrity [name], [name] ([id number]) Goals: Ulcer/skin breakdown will have a volume reduction of 30% by week 4 Date Initiated: 04/24/2015 Goal Status: Active Interventions: Assess ulceration(s) every visit Notes: Electronic Signature(s) Signed: 05/22/2015 4:49:41 PM By: Regan Lemming BSN, RN Entered By: Regan Lemming on 05/22/2015 15:14:59 [name] ([id number]) -------------------------------------------------------------------------------- Pain Assessment Details Patient Name: [name] Date of Service: 05/22/2015 3:00 PM Medical Record Number: [id number] Patient  Account Number: [id number] Date of Birth/Sex: 10-30-1956 (59 y.o. Female) Treating RN: Baruch Gouty, RN, BSN, Velva Harman Primary Care Physician: [name] Other Clinician: Referring Physician: [name] Treating Physician/Extender: Frann Rider in Treatment: 4 Active Problems Location of Pain Severity and Description of Pain Patient Has Paino No Site Locations Pain Management and Medication Current Pain Management: Electronic Signature(s) Signed: 05/22/2015 4:49:41 PM By: Regan Lemming BSN, RN Entered By: Regan Lemming on 05/22/2015 15:11:03 [name] ([id number]) -------------------------------------------------------------------------------- Patient/Caregiver Education Details Patient Name: [name] Date of Service: 05/22/2015 3:00 PM Medical Record Number: [id number] Patient Account Number: [id number] Date of Birth/Gender: 12/05/1955 (59 y.o. Female) Treating RN: Baruch Gouty, RN, BSN, Velva Harman Primary Care Physician: [name] Other Clinician: Referring Physician: [name] Treating Physician/Extender: Frann Rider in Treatment: 4 Education Assessment Education Provided To: Patient Education Topics Provided Malignant/Atypical Wounds: Methods: Explain/Verbal Responses: Reinforcements needed Welcome To [facility]: Methods: Explain/Verbal Responses: State content correctly Electronic Signature(s) Signed: 05/22/2015 4:49:41 PM By: Regan Lemming BSN, RN Entered By: Regan Lemming on 05/22/2015 16:48:10 [name] ([id number]) -------------------------------------------------------------------------------- Wound Assessment Details Patient Name: [name] Date of Service: 05/22/2015 3:00 PM Medical Record Number: [id number] Patient Account Number: [id number] Date of Birth/Sex: 07-07-1956 (59 y.o. Female) Treating RN: Baruch Gouty, RN, BSN, Velva Harman Primary Care Physician: [name] Other Clinician: Referring Physician: [name]  [facility] Treating Physician/Extender: Frann Rider in Treatment: 4 Wound Status Wound Number: 1 Primary Lymphedema Etiology: Wound Location: Right Upper Arm - Posterior Wound Open Wounding Event: Gradually Appeared Status: Date Acquired: 03/23/2015 Comorbid Deep Vein Thrombosis, Hypertension, Weeks Of Treatment: 4 History: Received Chemotherapy, Received Clustered Wound: No Radiation Photos Photo Uploaded By: Regan Lemming on 05/22/2015 16:40:17 Wound Measurements Length: (cm) 15 Width: (cm) 23.5 Depth: (cm) 0.2 Area: (cm) 276.853 Volume: (cm) 55.371 % Reduction in Area: -74.1% % Reduction in Volume: -74.1% Epithelialization: Small (1-33%) Undermining: No Wound Description Classification: Partial Thickness Foul Odor Af Wound Margin: Distinct, outline attached Due to Produ Exudate Amount: Small Exudate Type: Purulent Exudate Color: yellow, brown, green ter Cleansing: Yes ct Use: No Wound Bed Granulation Amount: Small (1-33%) Exposed Structure Granulation Quality: Pink, Pale Fascia Exposed: No Necrotic Amount: Large (67-100%) Fat Layer Exposed: No Necrotic Quality: Adherent Slough Tendon Exposed: No [name], [name] ([id number]) Muscle Exposed: No Joint Exposed: No Bone Exposed: No Limited to Skin Breakdown Periwound Skin Texture Texture Color No Abnormalities Noted: No No Abnormalities Noted: No Callus: No Atrophie Blanche: No Crepitus: No Cyanosis: No Excoriation: No Ecchymosis: No Fluctuance: No Erythema: No Friable: No Hemosiderin Staining: No Induration: No Mottled: No Localized Edema: Yes Pallor: No Rash: No Rubor: No Scarring: No Moisture No Abnormalities Noted: No Dry / Scaly: No Maceration: No Moist: Yes Wound Preparation Ulcer Cleansing: Rinsed/Irrigated with Saline Topical Anesthetic Applied: Other: lidocaine 4%, Treatment Notes Wound #1 (Right, Posterior Upper Arm) 1. Cleansed with: Clean wound with Normal Saline 4.  Dressing Applied: Aquacel Ag 5. Secondary Dressing Applied ABD and Kerlix/Conform 7. Secured with Tape Notes Carboflex applied for odour control. Fish net stocking applied to help keep dressing in place. Electronic Signature(s) Signed: 05/22/2015 4:49:41 PM By: Regan Lemming BSN, RN Entered By: Regan Lemming on 05/22/2015 15:14:53 [name] ([id number]) -------------------------------------------------------------------------------- Vitals Details Patient Name: [name] Date of Service: 05/22/2015 3:00 PM Medical Record Number: [id number] Patient Account Number: [id number] Date of Birth/Sex: 06/05/1956 (59 y.o. Female) Treating RN: Afful, RN, BSN, [place] Primary Care Physician: [name] Other Clinician: Referring Physician: [name] Treating Physician/Extender: Frann Rider in Treatment: 4 Vital Signs Time Taken: 15:11 Temperature (F): 98.7 Height (in): 65 Pulse (bpm): 90 Weight (lbs): 168 Respiratory Rate (breaths/min): 18 Body Mass Index (BMI): 28 Blood Pressure (mmHg): 127/70 Reference Range: 80 - 120 mg / dl Electronic Signature(s) Signed: 05/22/2015 4:49:41 PM By: Regan Lemming BSN, RN Entered By: Regan Lemming on 05/22/2015 15:11:30

## 2015-05-27 ENCOUNTER — Telehealth: Payer: Self-pay | Admitting: Oncology

## 2015-05-27 NOTE — Telephone Encounter (Signed)
Faxed records to [place] [facility] Fax number: [phone]

## 2015-05-29 ENCOUNTER — Encounter: Payer: Commercial Managed Care - PPO | Admitting: Surgery

## 2015-05-29 ENCOUNTER — Encounter: Payer: Commercial Managed Care - PPO | Admitting: Oncology

## 2015-05-29 DIAGNOSIS — I972 Postmastectomy lymphedema syndrome: Secondary | ICD-10-CM | POA: Diagnosis not present

## 2015-05-29 DIAGNOSIS — I890 Lymphedema, not elsewhere classified: Secondary | ICD-10-CM | POA: Diagnosis not present

## 2015-05-29 DIAGNOSIS — I10 Essential (primary) hypertension: Secondary | ICD-10-CM | POA: Diagnosis not present

## 2015-05-29 DIAGNOSIS — S41101A Unspecified open wound of right upper arm, initial encounter: Secondary | ICD-10-CM | POA: Diagnosis not present

## 2015-05-29 DIAGNOSIS — Z853 Personal history of malignant neoplasm of breast: Secondary | ICD-10-CM | POA: Diagnosis not present

## 2015-05-29 DIAGNOSIS — L98491 Non-pressure chronic ulcer of skin of other sites limited to breakdown of skin: Secondary | ICD-10-CM | POA: Diagnosis not present

## 2015-05-29 DIAGNOSIS — C50919 Malignant neoplasm of unspecified site of unspecified female breast: Secondary | ICD-10-CM | POA: Diagnosis not present

## 2015-05-29 NOTE — Progress Notes (Unsigned)
No show

## 2015-05-29 NOTE — Progress Notes (Addendum)
[name], [name] ([id number]) Visit Report for 05/29/2015 Chief Complaint Document Details Patient Name: [name], [name] Date of Service: 05/29/2015 3:15 PM Medical Record Number: [id number] Patient Account Number: [id number] Date of Birth/Sex: Aug 06, 1956 (59 y.o. Female) Treating RN: Primary Care Physician: [name] Other Clinician: Referring Physician: [name] Treating Physician/Extender: Frann Rider in Treatment: 5 Information Obtained from: Patient Chief Complaint Patient presents to the wound care center for a consult due non healing wound. The patient was known to have bilateral lymphedema as a status post mastectomy syndrome now comes with an ulcerated area of her right upper arm which she's had for several weeks. Electronic Signature(s) Signed: 05/29/2015 3:16:15 PM By: [name], FACS Entered By: [name] on 05/29/2015 15:16:14 [name] ([id number]) -------------------------------------------------------------------------------- HPI Details Patient Name: [name] Date of Service: 05/29/2015 3:15 PM Medical Record Number: [id number] Patient Account Number: [id number] Date of Birth/Sex: [birth year] (59 y.o. Female) Treating RN: Primary Care Physician: [name] Other Clinician: Referring Physician: [name] Treating Physician/Extender: Frann Rider in Treatment: 5 History of Present Illness Location: bilateral arm lymphedema with now skin ulceration of the right upper extremity. Quality: Patient reports experiencing a dull pain to affected area(s). Severity: Patient states wound are getting worse. Duration: Patient has had the wound for > 3 months prior to seeking treatment at the wound center Timing: Pain in wound is Intermittent (comes and goes Context: The wound occurred when the patiento. Modifying Factors: Consults to this date include: local care and antibiotics as prescribed by the PCP and the  ER. Associated Signs and Symptoms: Patient reports having increase discharge. HPI Description: 59 y.o. [place] woman with stage IV breast cancer, originally diagnosed in April 2003 and now manifested chiefly by loco-regional nodal disease (neck, chest), without lung, liver, bone, or brain involvement. he has received extensive radiation and chemotherapy after the surgeries. She's always had bilateral lymphedema of her arms but now comes with an ulcerated area on her right upper arm posteriorly. Her medical Oncologist, Dr.Gustav [name], was concerned that what we may be seeing in Yisel's right upper extremity is actually skin spread of her cancer. This needs to be biopsied and she was sending her back to general surgery with that in mind. She placed a referral to the wound clinic . She has been on doxycycline for now. The general surgeon saw her 2 days ago and did a biopsy and this report is pending. In April 2016 she had a right upper arm venous duplex study which showed: No evidence of deep vein or superficial thrombosis involving the ooright upper extremity and left subclavian vein. Moderate edema noted throughout the right upper extremity. 05/01/2015 the patient tells me that her surgeon revealed that her biopsy was negative and that there was no pathology found. Other than that she is doing well and she thinks her edema has gone down a bit. 05/07/2015 -- I received a call from her medical oncologist Dr. Jana Hakim, who said he strongly suspects that this is going to be a sarcoma or other carcinoma and the biopsy done previously was inadequate. He requested me to take a biopsy which I agreed to do on her next visit which is today. The pathology would be sent to [facility] pathologist in [place]. 05/15/2015 -- final report is not back yet but the pathologist Dr. Chrystine Oiler did speak to me on the phone a few days ago and mentioned that he is doing final stains and there may be a mixture of a  angiosarcoma and metastatic  breast cancer. I have not been able to speak to Dr. Jana Hakim yet but I understand the patient has seen him today and he has discussed the provisional pathology and offered her some chemotherapy. 05/22/2015 --The final pathology report is back and it shows a metastatic breast carcinoma and an atypical vascular proliferation. 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 She informs me that she is going to [facility] to the [place] for further care. Electronic Signature(s) JAPNEET, STAGGS ([id number]) Signed: 05/29/2015 3:16:32 PM By: [name], FACS Entered By: [name] on 05/29/2015 15:16:31 [name] ([id number]) -------------------------------------------------------------------------------- Physical Exam Details Patient Name: [name] Date of Service: 05/29/2015 3:15 PM Medical Record Number: [id number] Patient Account Number: [id number] Date of Birth/Sex: [birth year] (59 y.o. Female) Treating RN: Primary Care Physician: [name] Other Clinician: Referring Physician: [name] Treating Physician/Extender: Frann Rider in Treatment: 5 Constitutional . Pulse regular. Respirations normal and unlabored. Afebrile. . Eyes Nonicteric. Reactive to light. Ears, Nose, Mouth, and Throat Lips, teeth, and gums WNL.Marland Kitchen Moist mucosa without lesions . Neck supple and nontender. No palpable supraclavicular or cervical adenopathy. Normal sized without goiter. Respiratory WNL. No retractions.. Cardiovascular Pedal Pulses WNL. No clubbing, cyanosis or edema. Lymphatic No adneopathy. No adenopathy. No adenopathy. Musculoskeletal Adexa without tenderness or enlargement.. Digits and nails w/o clubbing, cyanosis, infection, petechiae, ischemia, or inflammatory conditions.. Integumentary (Hair, Skin) No suspicious lesions. No crepitus or fluctuance. No peri-wound warmth or erythema. No masses.Marland Kitchen Psychiatric Judgement  and insight Intact.. No evidence of depression, anxiety, or agitation.. Notes The wound on the right upper arm especially in the posterior aspect continues to have some slough but overall has not changed much. There is still a minimal odor. Electronic Signature(s) Signed: 05/29/2015 3:17:52 PM By: [name], FACS Entered By: [name] on 05/29/2015 15:17:52 [name] ([id number]) -------------------------------------------------------------------------------- Physician Orders Details Patient Name: [name] Date of Service: 05/29/2015 3:15 PM Medical Record Patient Account Number: [id number] Number: Afful, RN, BSN, Treating RN: [birth year] (59 y.o. 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 o May shower with protection. Primary Wound Dressing Wound #1 Right,Posterior Upper Arm o Aquacel Ag o Other: - carboflex for odour control Secondary Dressing Wound #1 Right,Posterior Upper Arm o ABD and Kerlix/Conform Dressing Change Frequency Wound #1 Right,Posterior Upper Arm o Change dressing every other day. Follow-up Appointments Wound #1 Right,Posterior Upper Arm o Return Appointment in 1 week. Electronic Signature(s) Signed: 05/29/2015 3:27:48 PM By: Regan Lemming BSN, RN Signed: 06/01/2015 12:13:47 PM By: [name], FACS [place],  [name] ([id number]) Entered By: Regan Lemming on 05/29/2015 15:27:47 [name] ([id number]) -------------------------------------------------------------------------------- Problem List Details Patient Name: [name] Date of Service: 05/29/2015 3:15 PM Medical Record Number: [id number] Patient Account Number: [id number] Date of Birth/Sex: 06-03-1956 (59 y.o. 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 Female) Treating RN: Primary Care Physician: [name] Other Clinician: Referring Physician: [name] Treating Physician/Extender: Frann Rider in Treatment: 5 Subjective Chief Complaint Information obtained from Patient Patient presents to the wound care center for a consult due non healing wound. The patient was known to have bilateral lymphedema as a status post mastectomy syndrome now comes with an ulcerated area of her right upper arm which she's had for several weeks. History of Present Illness (HPI) The following HPI elements were documented for the patient's  wound: Location: bilateral arm lymphedema with now skin ulceration of the right upper extremity. Quality: Patient reports experiencing a dull pain to affected area(s). Severity: Patient states wound are getting worse. Duration: Patient has had the wound for > 3 months prior to seeking treatment at the wound center Timing: Pain in wound is Intermittent (comes and goes Context: The wound occurred when the patient . Modifying Factors: Consults to this date include: local care and antibiotics as prescribed by the PCP and the ER. Associated Signs and Symptoms: Patient reports having increase discharge. 59 y.o. [place] woman with stage IV breast cancer, originally diagnosed in April 2003 and now manifested chiefly by loco-regional nodal disease (neck, chest), without lung, liver, bone, or brain involvement. he has received extensive radiation and chemotherapy after the surgeries. She's always had bilateral lymphedema of her arms but now comes with an ulcerated area on her right upper arm posteriorly. Her medical Oncologist, Dr.Gustav [name], was concerned that what we may be seeing in Teigan's right upper extremity is actually skin spread of her cancer. This needs to be biopsied and she was sending her back to general surgery with that in mind. She placed a referral to the wound clinic . She has been on doxycycline for now. The general surgeon saw her 2 days ago and did a biopsy and this report is pending. In April 2016 she had a right upper arm venous duplex study which showed: No evidence of deep vein or superficial thrombosis involving the right upper extremity and left subclavian vein. Moderate edema noted throughout the right upper extremity. 05/01/2015 the patient tells me that her surgeon revealed that her biopsy was negative and that there was no pathology found. Other than that she is doing well and she thinks her edema has gone down a bit. 05/07/2015 -- I received a call from her  medical oncologist Dr. Jana Hakim, who said he strongly suspects that this is going to be a sarcoma or other carcinoma and the biopsy done previously was inadequate. He requested me to take a biopsy which I agreed to do on her next visit which is today. The pathology would be sent to [facility] pathologist in [place]. 05/15/2015 -- final report is not back yet but the pathologist Dr. Chrystine Oiler did speak to me on the phone a few days ago and mentioned that he is doing final stains and there may be a mixture of a angiosarcoma and Needs, [place] ([id number]) metastatic breast cancer. I have not been able to speak to Dr. Jana Hakim yet but I understand the  patient has seen him today and he has discussed the provisional pathology and offered her some chemotherapy. 05/22/2015 --The final pathology report is back and it shows a metastatic breast carcinoma and an atypical vascular proliferation. 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 She informs me that she is going to [facility] to the [place] for further care. Objective Constitutional Pulse regular. Respirations normal and unlabored. Afebrile. Vitals Time Taken: 3:15 PM, Height: 65 in, Weight: 168 lbs, BMI: 28, Temperature: 98.2 F, Pulse: 83 bpm, Respiratory Rate: 16 breaths/min, Blood Pressure: 122/68 mmHg. Eyes Nonicteric. Reactive to light. Ears, Nose, Mouth, and Throat Lips, teeth, and gums WNL.Marland Kitchen Moist mucosa without lesions . Neck supple and nontender. No palpable supraclavicular or cervical adenopathy. Normal sized without goiter. Respiratory WNL. No retractions.. Cardiovascular Pedal Pulses WNL. No clubbing, cyanosis or edema. Lymphatic No adneopathy. No adenopathy. No adenopathy. Musculoskeletal Adexa without tenderness or enlargement.. Digits and nails w/o clubbing, cyanosis, infection, petechiae, ischemia, or inflammatory conditions.Marland Kitchen Psychiatric Judgement and insight Intact.. No evidence of depression, anxiety,  or agitation.. General Notes: The wound on the right upper arm especially in the posterior aspect continues to have some slough but overall has not changed much. There is still a minimal odor. CAROLIN, QUANG ([id number]) Integumentary (Hair, Skin) No suspicious lesions. No crepitus or fluctuance. No peri-wound warmth or erythema. No masses.. Wound #1 status is Open. Original cause of wound was Gradually Appeared. The wound is located on the Right,Posterior Upper Arm. The wound measures 15cm length x 24cm width x 0.2cm depth; 282.743cm^2 area and 56.549cm^3 volume. The wound is limited to skin breakdown. There is no tunneling or undermining noted. There is a small amount of purulent drainage noted. The wound margin is distinct with the outline attached to the wound base. There is small (1-33%) pink, pale granulation within the wound bed. There is a large (67-100%) amount of necrotic tissue within the wound bed including Adherent Slough. The periwound skin appearance exhibited: Localized Edema, Moist. The periwound skin appearance did not exhibit: Callus, Crepitus, Excoriation, Fluctuance, Friable, Induration, Rash, Scarring, Dry/Scaly, Maceration, Atrophie Blanche, Cyanosis, Ecchymosis, Hemosiderin Staining, Mottled, Pallor, Rubor, Erythema. Assessment Active Problems ICD-10 S41.101A - Unspecified open wound of right upper arm, initial encounter Z85.3 - Personal history of malignant neoplasm of breast I97.2 - Postmastectomy lymphedema syndrome The patient has no change in the overall condition of her ulceration on the right upper arm. It is all covered with various amounts of slough and necrotic debris and there is no odor. We will continue with silver alginate, carboflex and a light wrap. She will come back and see as next week. Plan Wound Cleansing: Wound #1 Right,Posterior Upper Arm: Cleanse wound with mild soap and water May Shower, gently pat wound dry prior to applying new  dressing. May shower with protection. Primary Wound Dressing: Wound #1 Right,Posterior Upper Arm: Aquacel Ag Other: - carboflex for odour control Feagans, [place] ([id number]) Secondary Dressing: Wound #1 Right,Posterior Upper Arm: ABD and Kerlix/Conform Dressing Change Frequency: Wound #1 Right,Posterior Upper Arm: Change dressing every other day. Follow-up Appointments: Wound #1 Right,Posterior Upper Arm: Return Appointment in 1 week. The patient has no change in the overall condition of her ulceration on the right upper arm. It is all covered with various amounts of slough and necrotic debris and there is no odor. We will continue with silver alginate, carboflex and a light wrap. She will come back and see as next week. Electronic Signature(s) Signed: 05/29/2015 3:36:02 PM By: [name],  FACS Entered By: [name] on 05/29/2015 15:36:01 [name] ([id number]) -------------------------------------------------------------------------------- SuperBill Details Patient Name: [name] Date of Service: 05/29/2015 Medical Record Number: [id number] Patient Account Number: [id number] Date of Birth/Sex: 05-Mar-1956 (59 y.o. 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

## 2015-05-29 NOTE — Progress Notes (Signed)
DANICA, CAMARENA ([id number]) Visit Report for 05/29/2015 Arrival Information Details Patient Name: [name], [name] Date of Service: 05/29/2015 3:15 PM Medical Record Number: [id number] Patient Account Number: [id number] Date of Birth/Sex: 14-Mar-1956 (59 y.o. Female) Treating RN: Afful, RN, BSN, Velva Harman Primary Care Physician: [name] Other Clinician: Referring Physician: [name] Treating Physician/Extender: Frann Rider in Treatment: 5 Visit Information History Since Last Visit Any new allergies or adverse reactions: No Patient Arrived: Ambulatory Had a fall or experienced change in No Arrival Time: 15:12 activities of daily living that may affect Accompanied By: husband risk of falls: Transfer Assistance: None Signs or symptoms of abuse/neglect since last No Patient Identification Verified: Yes visito Secondary Verification Process Yes Hospitalized since last visit: No Completed: Has Dressing in Place as Prescribed: Yes Patient Requires Transmission-Based No Pain Present Now: No Precautions: Patient Has Alerts: No Electronic Signature(s) Signed: 05/29/2015 3:13:00 PM By: Regan Lemming BSN, RN Entered By: Regan Lemming on 05/29/2015 15:13:00 [name] ([id number]) -------------------------------------------------------------------------------- Clinic Level of Care Assessment Details Patient Name: [name] Date of Service: 05/29/2015 3:15 PM Medical Record Number: [id number] Patient Account Number: [id number] Date of Birth/Sex: 1956-08-08 (59 y.o. Female) Treating RN: Afful, RN, BSN, Wallace Primary Care Physician: [name] Other Clinician: Referring Physician: [name] Treating Physician/Extender: Frann Rider in Treatment: 5 Clinic Level of Care Assessment Items TOOL 4 Quantity Score []  - Use when only an EandM is performed on FOLLOW-UP visit 0 ASSESSMENTS - Nursing Assessment / Reassessment X - Reassessment of Co-morbidities  (includes updates in patient status) 1 10 X - Reassessment of Adherence to Treatment Plan 1 5 ASSESSMENTS - Wound and Skin Assessment / Reassessment X - Simple Wound Assessment / Reassessment - one wound 1 5 []  - Complex Wound Assessment / Reassessment - multiple wounds 0 []  - Dermatologic / Skin Assessment (not related to wound area) 0 ASSESSMENTS - Focused Assessment []  - Circumferential Edema Measurements - multi extremities 0 []  - Nutritional Assessment / Counseling / Intervention 0 []  - Lower Extremity Assessment (monofilament, tuning fork, pulses) 0 []  - Peripheral Arterial Disease Assessment (using hand held doppler) 0 ASSESSMENTS - Ostomy and/or Continence Assessment and Care []  - Incontinence Assessment and Management 0 []  - Ostomy Care Assessment and Management (repouching, etc.) 0 PROCESS - Coordination of Care X - Simple Patient / Family Education for ongoing care 1 15 []  - Complex (extensive) Patient / Family Education for ongoing care 0 []  - Staff obtains Programmer, systems, Records, Test Results / Process Orders 0 []  - Staff telephones HHA, Nursing Homes / Clarify orders / etc 0 []  - Routine Transfer to another Facility (non-emergent condition) 0 [place], [place] ([id number]) []  - Routine Hospital Admission (non-emergent condition) 0 []  - New Admissions / Biomedical engineer / Ordering NPWT, Apligraf, etc. 0 []  - Emergency Hospital Admission (emergent condition) 0 X - Simple Discharge Coordination 1 10 []  - Complex (extensive) Discharge Coordination 0 PROCESS - Special Needs []  - Pediatric / Minor Patient Management 0 []  - Isolation Patient Management 0 []  - Hearing / Language / Visual special needs 0 []  - Assessment of Community assistance (transportation, D/C planning, etc.) 0 []  - Additional assistance / Altered mentation 0 []  - Support Surface(s) Assessment (bed, cushion, seat, etc.) 0 INTERVENTIONS - Wound Cleansing / Measurement X - Simple Wound Cleansing - one wound 1  5 []  - Complex Wound Cleansing - multiple wounds 0 X - Wound Imaging (photographs - any number of wounds) 1 5 []  - Wound Tracing (instead of photographs) 0  X - Simple Wound Measurement - one wound 1 5 []  - Complex Wound Measurement - multiple wounds 0 INTERVENTIONS - Wound Dressings X - Small Wound Dressing one or multiple wounds 1 10 []  - Medium Wound Dressing one or multiple wounds 0 []  - Large Wound Dressing one or multiple wounds 0 []  - Application of Medications - topical 0 []  - Application of Medications - injection 0 INTERVENTIONS - Miscellaneous []  - External ear exam 0 [place], [place] ([id number]) []  - Specimen Collection (cultures, biopsies, blood, body fluids, etc.) 0 []  - Specimen(s) / Culture(s) sent or taken to Lab for analysis 0 []  - Patient Transfer (multiple staff / Harrel Lemon Lift / Similar devices) 0 []  - Simple Staple / Suture removal (25 or less) 0 []  - Complex Staple / Suture removal (26 or more) 0 []  - Hypo / Hyperglycemic Management (close monitor of Blood Glucose) 0 []  - Ankle / Brachial Index (ABI) - do not check if billed separately 0 X - Vital Signs 1 5 Has the patient been seen at the hospital within the last three years: Yes Total Score: 75 Level Of Care: New/Established - Level 2 Electronic Signature(s) Signed: 05/29/2015 3:28:44 PM By: Regan Lemming BSN, RN Entered By: Regan Lemming on 05/29/2015 15:28:43 [name] ([id number]) -------------------------------------------------------------------------------- Encounter Discharge Information Details Patient Name: [name] Date of Service: 05/29/2015 3:15 PM Medical Record Number: [id number] Patient Account Number: [id number] Date of Birth/Sex: 10-Nov-1955 (59 y.o. Female) Treating RN: Baruch Gouty, RN, BSN, Velva Harman Primary Care Physician: [name] Other Clinician: Referring Physician: [name] Treating Physician/Extender: Frann Rider in Treatment: 5 Encounter Discharge Information  Items Discharge Pain Level: 0 Discharge Condition: Stable Ambulatory Status: Ambulatory Discharge Destination: Home Private Transportation: Auto Accompanied By: husband Schedule Follow-up Appointment: No Medication Reconciliation completed and No provided to Patient/Care Armonii Sieh: Clinical Summary of Care: Electronic Signature(s) Signed: 05/29/2015 3:29:42 PM By: Regan Lemming BSN, RN Entered By: Regan Lemming on 05/29/2015 15:29:42 [name] ([id number]) -------------------------------------------------------------------------------- Lower Extremity Assessment Details Patient Name: [name] Date of Service: 05/29/2015 3:15 PM Medical Record Number: [id number] Patient Account Number: [id number] Date of Birth/Sex: 1956-05-10 (59 y.o. Female) Treating RN: Baruch Gouty, RN, BSN, Velva Harman Primary Care Physician: [name] Other Clinician: Referring Physician: [name] Treating Physician/Extender: Frann Rider in Treatment: 5 Electronic Signature(s) Signed: 05/29/2015 3:18:58 PM By: Regan Lemming BSN, RN Entered By: Regan Lemming on 05/29/2015 15:18:58 [name] ([id number]) -------------------------------------------------------------------------------- Multi Wound Chart Details Patient Name: [name] Date of Service: 05/29/2015 3:15 PM Medical Record Number: [id number] Patient Account Number: [id number] Date of Birth/Sex: 1956-01-05 (59 y.o. Female) Treating RN: Baruch Gouty, RN, BSN, Velva Harman Primary Care Physician: [name] Other Clinician: Referring Physician: [name] Treating Physician/Extender: Frann Rider in Treatment: 5 Vital Signs Height(in): 65 Pulse(bpm): 83 Weight(lbs): 168 Blood Pressure 122/68 (mmHg): Body Mass Index(BMI): 28 Temperature(F): 98.2 Respiratory Rate 16 (breaths/min): Photos: [1:No Photos] [N/A:N/A] Wound Location: [1:Right Upper Arm - Posterior] [N/A:N/A] Wounding Event: [1:Gradually Appeared]  [N/A:N/A] Primary Etiology: [1:Lymphedema] [N/A:N/A] Comorbid History: [1:Deep Vein Thrombosis, Hypertension, Received Chemotherapy, Received Radiation] [N/A:N/A] Date Acquired: [1:03/23/2015] [N/A:N/A] Weeks of Treatment: [1:5] [N/A:N/A] Wound Status: [1:Open] [N/A:N/A] Measurements L x W x D 15x24x0.2 [N/A:N/A] (cm) Area (cm) : [7:858.850] [N/A:N/A] Volume (cm) : [1:56.549] [N/A:N/A] % Reduction in Area: [1:-77.80%] [N/A:N/A] % Reduction in Volume: -77.80% [N/A:N/A] Classification: [1:Partial Thickness] [N/A:N/A] Exudate Amount: [1:Small] [N/A:N/A] Exudate Type: [1:Purulent] [N/A:N/A] Exudate Color: [1:yellow, brown, green] [N/A:N/A] Foul Odor After [1:Yes] [N/A:N/A] Cleansing: Odor Anticipated Due to No [N/A:N/A] Product Use: Wound Margin: [  1:Distinct, outline attached] [N/A:N/A] Granulation Amount: [1:Small (1-33%)] [N/A:N/A] Granulation Quality: [1:Pink, Pale] [N/A:N/A] Necrotic Amount: [1:Large (67-100%)] [N/A:N/A] Exposed Structures: Fascia: No N/A N/A Fat: No Tendon: No Muscle: No Joint: No Bone: No Limited to Skin Breakdown Epithelialization: Small (1-33%) N/A N/A Periwound Skin Texture: Edema: Yes N/A N/A Excoriation: No Induration: No Callus: No Crepitus: No Fluctuance: No Friable: No Rash: No Scarring: No Periwound Skin Moist: Yes N/A N/A Moisture: Maceration: No Dry/Scaly: No Periwound Skin Color: Atrophie Blanche: No N/A N/A Cyanosis: No Ecchymosis: No Erythema: No Hemosiderin Staining: No Mottled: No Pallor: No Rubor: No Tenderness on No N/A N/A Palpation: Wound Preparation: Ulcer Cleansing: N/A N/A Rinsed/Irrigated with Saline Topical Anesthetic Applied: Other: lidocaine 4% Treatment Notes Electronic Signature(s) Signed: 05/29/2015 3:26:34 PM By: Regan Lemming BSN, RN Entered By: Regan Lemming on 05/29/2015 15:26:34 [name]  ([id number]) -------------------------------------------------------------------------------- [place] Details Patient Name: [name] Date of Service: 05/29/2015 3:15 PM Medical Record Number: [id number] Patient Account Number: [id number] Date of Birth/Sex: [birth year] (59 y.o. Female) Treating RN: Afful, RN, BSN, Velva Harman Primary Care Physician: [name] Other Clinician: Referring Physician: [name] Treating Physician/Extender: Frann Rider in Treatment: 5 Active Inactive Malignancy/Atypical Etiology Nursing Diagnoses: Knowledge deficit related to disease process and management of atypical ulcer etiology Goals: Patient/caregiver will verbalize understanding of disease process and disease management of atypical ulcer etiology Date Initiated: 04/24/2015 Goal Status: Active Interventions: Provide education on atypical ulcer etiologies Treatment Activities: Biopsy : 05/08/2015 Notes: Orientation to the Wound Care Program Nursing Diagnoses: Knowledge deficit related to the wound healing center program Goals: Patient/caregiver will verbalize understanding of the [facility] Date Initiated: 04/24/2015 Goal Status: Active Interventions: Provide education on orientation to the wound center Notes: Wound/Skin Impairment Nursing Diagnoses: Impaired tissue integrity [place], [place] ([id number]) Goals: Ulcer/skin breakdown will have a volume reduction of 30% by week 4 Date Initiated: 04/24/2015 Goal Status: Active Interventions: Assess ulceration(s) every visit Notes: Electronic Signature(s) Signed: 05/29/2015 3:25:22 PM By: Regan Lemming BSN, RN Entered By: Regan Lemming on 05/29/2015 15:25:22 [name] ([id number]) -------------------------------------------------------------------------------- Pain Assessment Details Patient Name: [name] Date of Service: 05/29/2015 3:15 PM Medical Record Number: [id number] Patient  Account Number: [id number] Date of Birth/Sex: 1956-04-13 (59 y.o. Female) Treating RN: Baruch Gouty, RN, BSN, Velva Harman Primary Care Physician: [name] Other Clinician: Referring Physician: [name] Treating Physician/Extender: Frann Rider in Treatment: 5 Active Problems Location of Pain Severity and Description of Pain Patient Has Paino No Site Locations Pain Management and Medication Current Pain Management: Electronic Signature(s) Signed: 05/29/2015 3:15:33 PM By: Regan Lemming BSN, RN Entered By: Regan Lemming on 05/29/2015 15:15:33 [name] ([id number]) -------------------------------------------------------------------------------- Patient/Caregiver Education Details Patient Name: [name] Date of Service: 05/29/2015 3:15 PM Medical Record Number: [id number] Patient Account Number: [id number] Date of Birth/Gender: October 28, 1956 (59 y.o. Female) Treating RN: Baruch Gouty, RN, BSN, Velva Harman Primary Care Physician: [name] Other Clinician: Referring Physician: [name] Treating Physician/Extender: Frann Rider in Treatment: 5 Education Assessment Education Provided To: Patient Education Topics Provided Malignant/Atypical Wounds: Methods: Explain/Verbal Responses: State content correctly Welcome To [facility]: Methods: Explain/Verbal Responses: State content correctly Electronic Signature(s) Signed: 05/29/2015 3:29:56 PM By: Regan Lemming BSN, RN Entered By: Regan Lemming on 05/29/2015 15:29:56 [name] ([id number]) -------------------------------------------------------------------------------- Wound Assessment Details Patient Name: [name] Date of Service: 05/29/2015 3:15 PM Medical Record Number: [id number] Patient Account Number: [id number] Date of Birth/Sex: Mar 27, 1956 (59 y.o. Female) Treating RN: Baruch Gouty, RN, BSN, Velva Harman Primary Care Physician: [name] Other Clinician: Referring Physician:  [name] Treating Physician/Extender: Frann Rider in Treatment: 5 Wound Status Wound Number: 1 Primary Lymphedema Etiology: Wound Location: Right Upper Arm - Posterior Wound Open Wounding Event: Gradually Appeared Status: Date Acquired: 03/23/2015 Comorbid Deep Vein Thrombosis, Hypertension, Weeks Of Treatment: 5 History: Received Chemotherapy, Received Clustered Wound: No Radiation Photos Photo Uploaded By: Regan Lemming on 05/29/2015 15:53:12 Wound Measurements Length: (cm) 15 Width: (cm) 24 Depth: (cm) 0.2 Area: (cm) 282.743 Volume: (cm) 56.549 % Reduction in Area: -77.8% % Reduction in Volume: -77.8% Epithelialization: Small (1-33%) Tunneling: No Undermining: No Wound Description Classification: Partial Thickness Foul Odor Af Wound Margin: Distinct, outline attached Due to Produ Exudate Amount: Small Exudate Type: Purulent Exudate Color: yellow, brown, green ter Cleansing: Yes ct Use: No Wound Bed Granulation Amount: Small (1-33%) Exposed Structure Granulation Quality: Pink, Pale Fascia Exposed: No Necrotic Amount: Large (67-100%) Fat Layer Exposed: No Necrotic Quality: Adherent Slough Tendon Exposed: No [place], [place] ([id number]) Muscle Exposed: No Joint Exposed: No Bone Exposed: No Limited to Skin Breakdown Periwound Skin Texture Texture Color No Abnormalities Noted: No No Abnormalities Noted: No Callus: No Atrophie Blanche: No Crepitus: No Cyanosis: No Excoriation: No Ecchymosis: No Fluctuance: No Erythema: No Friable: No Hemosiderin Staining: No Induration: No Mottled: No Localized Edema: Yes Pallor: No Rash: No Rubor: No Scarring: No Moisture No Abnormalities Noted: No Dry / Scaly: No Maceration: No Moist: Yes Wound Preparation Ulcer Cleansing: Rinsed/Irrigated with Saline Topical Anesthetic Applied: Other: lidocaine 4%, Treatment Notes Wound #1 (Right, Posterior Upper Arm) 1. Cleansed with: Cleanse wound with  antibacterial soap and water 4. Dressing Applied: Aquacel Ag 5. Secondary Dressing Applied Guaze, ABD and kerlix/Conform 7. Secured with Tape Notes Carboflex applied for odour control. Fish net stocking applied to help keep dressing in place. Electronic Signature(s) Signed: 05/29/2015 3:20:36 PM By: Regan Lemming BSN, RN Entered By: Regan Lemming on 05/29/2015 15:20:36 [name] ([id number]) -------------------------------------------------------------------------------- Vitals Details Patient Name: [name] Date of Service: 05/29/2015 3:15 PM Medical Record Number: [id number] Patient Account Number: [id number] Date of Birth/Sex: 12-04-1955 (59 y.o. Female) Treating RN: Afful, RN, BSN, [place] Primary Care Physician: [name] Other Clinician: Referring Physician: [name] Treating Physician/Extender: Frann Rider in Treatment: 5 Vital Signs Time Taken: 15:15 Temperature (F): 98.2 Height (in): 65 Pulse (bpm): 83 Weight (lbs): 168 Respiratory Rate (breaths/min): 16 Body Mass Index (BMI): 28 Blood Pressure (mmHg): 122/68 Reference Range: 80 - 120 mg / dl Electronic Signature(s) Signed: 05/29/2015 3:18:52 PM By: Regan Lemming BSN, RN Entered By: Regan Lemming on 05/29/2015 15:18:52

## 2015-06-01 ENCOUNTER — Telehealth: Payer: Self-pay | Admitting: Oncology

## 2015-06-01 NOTE — Telephone Encounter (Signed)
Patent confirmed appointment for 09/21

## 2015-06-02 ENCOUNTER — Telehealth: Payer: Self-pay | Admitting: *Deleted

## 2015-06-02 ENCOUNTER — Other Ambulatory Visit: Payer: Self-pay | Admitting: *Deleted

## 2015-06-02 MED ORDER — DOXYCYCLINE HYCLATE 100 MG PO TABS: ORAL_TABLET | ORAL | Status: DC

## 2015-06-02 MED ORDER — OXYCODONE HCL 10 MG PO TABS: ORAL_TABLET | ORAL | Status: DC

## 2015-06-02 NOTE — Telephone Encounter (Signed)
Patient would like to know if she can have an increase in oxycodone (20 mg) and a prescription of 30 tablets until patient is seen by her PCP. Message sent to MD [name]/RN Val.

## 2015-06-02 NOTE — Telephone Encounter (Signed)
This RN called [name] and discussed with per request as well as current plans for consult at the [facility].  [name] states she is scheduled to leave next Monday for an appointment on 8/2 " and they told me to plan to be there at least 3 days "  This RN validated pt's plan per above including goal of this office is for her to be as strong and healthy as possible. Per conversation including need to maintain local care for management of her diagnosis.  [name] stated " [name] is still my doctor and I want to keep him "  [name] states " could I do the chemo here that they recommend ?".  This RN informed pt that certain therapy they recommend can be done here but then there are some we do not.  [name] also noted she missed her last appointment due to " having to be at the wound clinic and I would like to be seen before September"  Per call [name] was able to state she wants " treatment " including the word chemotherapy. She also states " I am just getting weaker and not able to do for myself " and " I keep losing weight ".  This RN again informed pt goal of therapy is to help her be as strong as she can.  Plan per end of phone discussion is refill will be given. [name] will call this RN next Friday upon return from [place] [place] with update on their recommendation. Appointment will be made for week of 06/15/2015.

## 2015-06-04 ENCOUNTER — Telehealth: Payer: Self-pay | Admitting: Oncology

## 2015-06-04 ENCOUNTER — Encounter (HOSPITAL_BASED_OUTPATIENT_CLINIC_OR_DEPARTMENT_OTHER): Payer: Commercial Managed Care - PPO | Admitting: General Surgery

## 2015-06-04 ENCOUNTER — Encounter: Payer: Self-pay | Admitting: General Surgery

## 2015-06-04 DIAGNOSIS — Z853 Personal history of malignant neoplasm of breast: Secondary | ICD-10-CM | POA: Diagnosis not present

## 2015-06-04 DIAGNOSIS — S41101A Unspecified open wound of right upper arm, initial encounter: Secondary | ICD-10-CM

## 2015-06-04 DIAGNOSIS — S41101D Unspecified open wound of right upper arm, subsequent encounter: Secondary | ICD-10-CM

## 2015-06-04 DIAGNOSIS — I972 Postmastectomy lymphedema syndrome: Secondary | ICD-10-CM

## 2015-06-04 DIAGNOSIS — C50919 Malignant neoplasm of unspecified site of unspecified female breast: Secondary | ICD-10-CM | POA: Diagnosis not present

## 2015-06-04 NOTE — Progress Notes (Addendum)
AKERA, SNOWBERGER ([id number]) Visit Report for 06/04/2015 Chief Complaint Document Details Patient Name: [name] 06/04/2015 10:15 Date of Service: AM Medical Record [id number] Number: Patient Account Number: [id number] 20-Jan-1956 (59 y.o. Treating RN: [name] Gouty, RN, BSN, [name] Date of Birth/Sex: Female) Other Clinician: Primary Care Physician: [name] Referring Physician: Ricke [name] Physician/Extender: [name] in Treatment: 5 Information Obtained from: Patient Chief Complaint Patient presents to the wound care center for a consult due non healing wound. The patient was known to have bilateral lymphedema as a status post mastectomy syndrome now comes with an ulcerated area of her right upper arm which she's had for several weeks. Electronic Signature(s) Signed: 06/04/2015 11:21:17 AM By: [name] Companion MD Entered By: [name] on 06/04/2015 11:21:17 [name] ([id number]) -------------------------------------------------------------------------------- HPI Details Patient Name: [name], [name] 06/04/2015 10:15 Date of Service: AM Medical Record [id number] Number: Patient Account Number: [id number] Nov 13, 1955 (59 y.o. Treating RN: [name] Gouty, RN, BSN, [name] Date of Birth/Sex: Female) Other Clinician: Primary Care Physician: [name] Referring Physician: Ricke [name] Physician/Extender: [name] in Treatment: 5 History of Present Illness Location: bilateral arm lymphedema with now skin ulceration of the right upper extremity. Quality: Patient reports experiencing a dull pain to affected area(s). Severity: Patient states wound are getting worse. Duration: Patient has had the wound for > 3 months prior to seeking treatment at the wound center Timing: Pain in wound is Intermittent (comes and goes Context: The wound occurred when the patiento. Modifying Factors: Consults to this date include: local care and  antibiotics as prescribed by the PCP and the ER. Associated Signs and Symptoms: Patient reports having increase discharge. HPI Description: 59 y.o. [place] woman with stage IV breast cancer, originally diagnosed in April 2003 and now manifested chiefly by loco-regional nodal disease (neck, chest), without lung, liver, bone, or brain involvement. he has received extensive radiation and chemotherapy after the surgeries. She's always had bilateral lymphedema of her arms but now comes with an ulcerated area on her right upper arm posteriorly. Her medical Oncologist, [name], was concerned that what we may be seeing in [name]'s right upper extremity is actually skin spread of her cancer. This needs to be biopsied and she was sending her back to general surgery with that in mind. She placed a referral to the wound clinic . She has been on doxycycline for now. The general surgeon saw her 2 days ago and did a biopsy and this report is pending. In April 2016 she had a right upper arm venous duplex study which showed: No evidence of deep vein or superficial thrombosis involving the ooright upper extremity and left subclavian vein. Moderate edema noted throughout the right upper extremity. 05/01/2015 the patient tells me that her surgeon revealed that her biopsy was negative and that there was no pathology found. Other than that she is doing well and she thinks her edema has gone down a bit. 05/07/2015 -- I received a call from her medical oncologist Dr. Jana [name], who said he strongly suspects that this is going to be a sarcoma or other carcinoma and the biopsy done previously was inadequate. He requested me to take a biopsy which I agreed to do on her next visit which is today. The pathology would be sent to [facility] pathologist in [place]. 05/15/2015 -- final report is not back yet but the pathologist Dr. Chrystine Oiler did speak to me on the phone a few days ago and mentioned that he is doing  final stains and there may be  a mixture of a angiosarcoma and metastatic breast cancer. I have not been able to speak to Dr. Jana [name] yet but I understand the patient has seen him today and he has discussed the provisional pathology and offered her some chemotherapy. 05/22/2015 --The final pathology report is back and it shows a metastatic breast carcinoma and an atypical vascular proliferation. 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 She informs me that she is going to [facility] to the [place] for further care. [name], [name] ([id number]) Electronic Signature(s) Signed: 06/04/2015 11:21:25 AM By: [name] Companion MD Entered By: [name] on 06/04/2015 11:21:24 [name], [name] ([id number]) -------------------------------------------------------------------------------- Physical Exam Details Patient Name: [name], [name] 06/04/2015 10:15 Date of Service: AM Medical Record [id number] Number: Patient Account Number: [id number] [birth year] (59 y.o. Treating RN: [name] Gouty, RN, BSN, [name] Date of Birth/Sex: Female) Other Clinician: Primary Care Physician: [name] Referring Physician: Ricke [name] Physician/Extender: [name] in Treatment: 5 Notes Start santyl dressins daily Electronic Signature(s) Signed: 06/04/2015 11:22:00 AM By: [name] Companion MD Entered By: [name] on 06/04/2015 11:22:00 [name] ([id number]) -------------------------------------------------------------------------------- Physician Orders Details Patient Name: [name], [name] 06/04/2015 10:15 Date of Service: AM Medical Record [id number] Number: Patient Account Number: [id number] June 06, 1956 (59 y.o. Treating RN: [name] Gouty, RN, BSN, [name] Date of Birth/Sex: Female) Other Clinician: Primary Care Physician: [name] Referring Physician: Ricke [name] Physician/Extender: [name] in Treatment: 5 Verbal / Phone Orders: Yes Clinician:  Afful, RN, BSN, Rita Read Back and Verified: Yes Diagnosis Coding Wound Cleansing Wound #1 Right,Posterior Upper Arm o Cleanse wound with mild soap and water o May Shower, gently pat wound dry prior to applying new dressing. o May shower with protection. Primary Wound Dressing Wound #1 Right,Posterior Upper Arm o Santyl Ointment Secondary Dressing Wound #1 Right,Posterior Upper Arm o Gauze, ABD and Kerlix/Conform o XtraSorb Dressing Change Frequency Wound #1 Right,Posterior Upper Arm o Change dressing every day. - Santyl applied daily Follow-up Appointments Wound #1 Right,Posterior Upper Arm o Return Appointment in 2 weeks. Electronic Signature(s) Signed: 06/04/2015 11:20:48 AM By: [name] Companion MD Entered By: [name] on 06/04/2015 11:20:47 [name] ([id number]) -------------------------------------------------------------------------------- Problem List Details Patient Name: [name], [name] 06/04/2015 10:15 Date of Service: AM Medical Record [id number] Number: Patient Account Number: [id number] 1956/03/19 (59 y.o. Treating RN: [name] Gouty, RN, BSN, [name] Date of Birth/Sex: Female) Other Clinician: Primary Care Physician: [name] Hazard, Deonta Bomberger Referring Physician: Ricke [name] Physician/Extender: [name] in Treatment: 5 Active Problems ICD-10 Encounter Code Description Active Date Diagnosis S41.101A Unspecified open wound of right upper arm, initial 04/24/2015 Yes encounter Z85.3 Personal history of malignant neoplasm of breast 04/24/2015 Yes I97.2 Postmastectomy lymphedema syndrome 04/24/2015 Yes Inactive Problems Resolved Problems Electronic Signature(s) Signed: 06/04/2015 11:21:07 AM By: [name] Companion MD Entered By: [name] on 06/04/2015 11:21:07 [name] ([id number]) -------------------------------------------------------------------------------- Progress Note Details Patient Name: [name], [name] 06/04/2015  10:15 Date of Service: AM Medical Record [id number] Number: Patient Account Number: [id number] Feb 15, 1956 (59 y.o. Treating RN: [name] Gouty, RN, BSN, [name] Date of Birth/Sex: Female) Other Clinician: Primary Care Physician: [name] Hazard, Kalonji Zurawski Referring Physician: Ricke [name] Physician/Extender: [name] in Treatment: 5 Subjective Chief Complaint Information obtained from Patient Patient presents to the wound care center for a consult due non healing wound. The patient was known to have bilateral lymphedema as a status post mastectomy syndrome now comes with an ulcerated area of her right upper arm which she's had for several weeks. History of Present Illness (HPI) The following HPI elements were documented for  the patient's wound: Location: bilateral arm lymphedema with now skin ulceration of the right upper extremity. Quality: Patient reports experiencing a dull pain to affected area(s). Severity: Patient states wound are getting worse. Duration: Patient has had the wound for > 3 months prior to seeking treatment at the wound center Timing: Pain in wound is Intermittent (comes and goes Context: The wound occurred when the patient . Modifying Factors: Consults to this date include: local care and antibiotics as prescribed by the PCP and the ER. Associated Signs and Symptoms: Patient reports having increase discharge. 59 y.o. [place] woman with stage IV breast cancer, originally diagnosed in April 2003 and now manifested chiefly by loco-regional nodal disease (neck, chest), without lung, liver, bone, or brain involvement. he has received extensive radiation and chemotherapy after the surgeries. She's always had bilateral lymphedema of her arms but now comes with an ulcerated area on her right upper arm posteriorly. Her medical Oncologist, [name], was concerned that what we may be seeing in Marjie's right upper extremity is actually skin spread of  her cancer. This needs to be biopsied and she was sending her back to general surgery with that in mind. She placed a referral to the wound clinic . She has been on doxycycline for now. The general surgeon saw her 2 days ago and did a biopsy and this report is pending. In April 2016 she had a right upper arm venous duplex study which showed: No evidence of deep vein or superficial thrombosis involving the right upper extremity and left subclavian vein. Moderate edema noted throughout the right upper extremity. 05/01/2015 the patient tells me that her surgeon revealed that her biopsy was negative and that there was no pathology found. Other than that she is doing well and she thinks her edema has gone down a bit. 05/07/2015 -- I received a call from her medical oncologist Dr. Jana [name], who said he strongly suspects that this is going to be a sarcoma or other carcinoma and the biopsy done previously was inadequate. He requested me to take a biopsy which I agreed to do on her next visit which is today. The pathology would be sent to [facility] pathologist in [place]. [name], [name] ([id number]) 05/15/2015 -- final report is not back yet but the pathologist Dr. Chrystine Oiler did speak to me on the phone a few days ago and mentioned that he is doing final stains and there may be a mixture of a angiosarcoma and metastatic breast cancer. I have not been able to speak to Dr. Jana [name] yet but I understand the patient has seen him today and he has discussed the provisional pathology and offered her some chemotherapy. 05/22/2015 --The final pathology report is back and it shows a metastatic breast carcinoma and an atypical vascular proliferation. 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 She informs me that she is going to [facility] to the [place] for further care. Objective Constitutional Vitals Time Taken: 10:19 AM, Height: 65 in, Weight: 168 lbs, BMI: 28, Temperature: 98.1 F, Pulse:  83 bpm, Respiratory Rate: 16 breaths/min, Blood Pressure: 104/62 mmHg. Integumentary (Hair, Skin) Wound #1 status is Open. Original cause of wound was Gradually Appeared. The wound is located on the Right,Posterior Upper Arm. The wound measures 15cm length x 24cm width x 0.2cm depth; 282.743cm^2 area and 56.549cm^3 volume. The wound is limited to skin breakdown. There is no tunneling or undermining noted. There is a small amount of purulent drainage noted. The wound margin is distinct with the  outline attached to the wound base. There is small (1-33%) pink, pale granulation within the wound bed. There is a large (67-100%) amount of necrotic tissue within the wound bed including Adherent Slough. The periwound skin appearance exhibited: Localized Edema, Moist. The periwound skin appearance did not exhibit: Callus, Crepitus, Excoriation, Fluctuance, Friable, Induration, Rash, Scarring, Dry/Scaly, Maceration, Atrophie Blanche, Cyanosis, Ecchymosis, Hemosiderin Staining, Mottled, Pallor, Rubor, Erythema. Periwound temperature was noted as No Abnormality. Assessment Active Problems ICD-10 S41.101A - Unspecified open wound of right upper arm, initial encounter Z85.3 - Personal history of malignant neoplasm of breast I97.2 - Postmastectomy lymphedema syndrome Diagnoses ICD-10 S41.101A: Unspecified open wound of right upper arm, initial encounter Z85.3: Personal history of malignant neoplasm of breast [name], [name] ([id number]) I97.2: Postmastectomy lymphedema syndrome Plan Wound Cleansing: Wound #1 Right,Posterior Upper Arm: Cleanse wound with mild soap and water May Shower, gently pat wound dry prior to applying new dressing. May shower with protection. Primary Wound Dressing: Wound #1 Right,Posterior Upper Arm: Santyl Ointment Secondary Dressing: Wound #1 Right,Posterior Upper Arm: Gauze, ABD and Kerlix/Conform XtraSorb Dressing Change Frequency: Wound #1 Right,Posterior Upper  Arm: Change dressing every day. - Santyl applied daily Follow-up Appointments: Wound #1 Right,Posterior Upper Arm: Return Appointment in 2 weeks. History of breast Ca with spread to right arm with severe ulcerations Electronic Signature(s) Signed: 06/16/2015 4:24:12 PM By: Lorine Bears RCP, RRT, CHT Previous Signature: 06/04/2015 11:23:14 AM Version By: [name] Companion MD Entered By: Lorine Bears on 06/16/2015 16:24:12 [name] ([id number]) -------------------------------------------------------------------------------- SuperBill Details Patient Name: [name] Date of Service: 06/04/2015 Medical Record Patient Account Number: [id number] Number: Afful, RN, BSN, Treating RN: May 22, 1956 (59 y.o. [name] Date of Birth/Sex: Female) Other Clinician: Primary Care Physician: [name] Hazard, Kayelyn Lemon Referring Physician: Ricke [name] Physician/Extender: [name] in Treatment: 5 Diagnosis Coding ICD-10 Codes Code Description S41.101A Unspecified open wound of right upper arm, initial encounter Z85.3 Personal history of malignant neoplasm of breast I97.2 Postmastectomy lymphedema syndrome Facility Procedures CPT4 Code: 47092957 Description: 99213 - WOUND CARE VISIT-LEV 3 EST PT Modifier: Quantity: 1 Physician Procedures CPT4 Code: [id number] Description: 09643 - WC PHYS LEVEL 2 - EST PT ICD-10 Description Diagnosis S41.101A Unspecified open wound of right upper arm, initi Modifier: al encounter Quantity: 1 Electronic Signature(s) Signed: 06/04/2015 11:23:39 AM By: [name] Companion MD Entered By: [name] on 06/04/2015 11:23:39

## 2015-06-04 NOTE — Progress Notes (Signed)
See i heal 

## 2015-06-04 NOTE — Telephone Encounter (Signed)
Faxed pt medical records tp [facility]

## 2015-06-05 NOTE — Progress Notes (Signed)
[name], [name] ([id number]) Visit Report for 06/04/2015 Arrival Information Details Patient Name: [name], [name] Date of Service: 06/04/2015 10:15 AM Medical Record Number: [id number] Patient Account Number: [id number] Date of Birth/Sex: 12-May-1956 (59 y.o. Female) Treating RN: Afful, RN, BSN, [name] Primary Care Physician: [name] Other Clinician: Referring Physician: Ricke [name] Treating Physician/Extender: [name] in Treatment: 5 Visit Information History Since Last Visit Any new allergies or adverse reactions: No Patient Arrived: Ambulatory Had a fall or experienced change in No Arrival Time: 10:16 activities of daily living that may affect Accompanied By: self risk of falls: Transfer Assistance: None Signs or symptoms of abuse/neglect since last No Patient Identification Verified: Yes visito Secondary Verification Process Yes Hospitalized since last visit: No Completed: Has Dressing in Place as Prescribed: Yes Patient Requires Transmission-Based No Pain Present Now: No Precautions: Patient Has Alerts: No Electronic Signature(s) Signed: 06/04/2015 11:17:46 AM By: [name] Companion MD Entered By: [name] on 06/04/2015 11:17:46 [name] ([id number]) -------------------------------------------------------------------------------- Clinic Level of Care Assessment Details Patient Name: [name] Date of Service: 06/04/2015 10:15 AM Medical Record Number: [id number] Patient Account Number: [id number] Date of Birth/Sex: 12/14/1955 (59 y.o. Female) Treating RN: Afful, RN, BSN, [name] Primary Care Physician: [name] Other Clinician: Referring Physician: Ricke [name] Treating Physician/Extender: [name] in Treatment: 5 Clinic Level of Care Assessment Items TOOL 4 Quantity Score []  - Use when only an EandM is performed on FOLLOW-UP visit 0 ASSESSMENTS - Nursing Assessment / Reassessment X - Reassessment of Co-morbidities  (includes updates in patient status) 1 10 X - Reassessment of Adherence to Treatment Plan 1 5 ASSESSMENTS - Wound and Skin Assessment / Reassessment X - Simple Wound Assessment / Reassessment - one wound 1 5 []  - Complex Wound Assessment / Reassessment - multiple wounds 0 []  - Dermatologic / Skin Assessment (not related to wound area) 0 ASSESSMENTS - Focused Assessment []  - Circumferential Edema Measurements - multi extremities 0 []  - Nutritional Assessment / Counseling / Intervention 0 []  - Lower Extremity Assessment (monofilament, tuning fork, pulses) 0 []  - Peripheral Arterial Disease Assessment (using hand held doppler) 0 ASSESSMENTS - Ostomy and/or Continence Assessment and Care []  - Incontinence Assessment and Management 0 []  - Ostomy Care Assessment and Management (repouching, etc.) 0 PROCESS - Coordination of Care X - Simple Patient / Family Education for ongoing care 1 15 []  - Complex (extensive) Patient / Family Education for ongoing care 0 []  - Staff obtains Programmer, systems, Records, Test Results / Process Orders 0 []  - Staff telephones HHA, Nursing Homes / Clarify orders / etc 0 []  - Routine Transfer to another Facility (non-emergent condition) 0 [name], [name] ([id number]) []  - Routine Hospital Admission (non-emergent condition) 0 []  - New Admissions / Biomedical engineer / Ordering NPWT, Apligraf, etc. 0 []  - Emergency Hospital Admission (emergent condition) 0 X - Simple Discharge Coordination 1 10 []  - Complex (extensive) Discharge Coordination 0 PROCESS - Special Needs []  - Pediatric / Minor Patient Management 0 []  - Isolation Patient Management 0 []  - Hearing / Language / Visual special needs 0 []  - Assessment of Community assistance (transportation, D/C planning, etc.) 0 []  - Additional assistance / Altered mentation 0 []  - Support Surface(s) Assessment (bed, cushion, seat, etc.) 0 INTERVENTIONS - Wound Cleansing / Measurement X - Simple Wound Cleansing - one wound 1  5 []  - Complex Wound Cleansing - multiple wounds 0 X - Wound Imaging (photographs - any number of wounds) 1 5 []  - Wound Tracing (instead of photographs) 0 X -  Simple Wound Measurement - one wound 1 5 []  - Complex Wound Measurement - multiple wounds 0 INTERVENTIONS - Wound Dressings X - Small Wound Dressing one or multiple wounds 1 10 []  - Medium Wound Dressing one or multiple wounds 0 []  - Large Wound Dressing one or multiple wounds 0 X - Application of Medications - topical 1 5 []  - Application of Medications - injection 0 INTERVENTIONS - Miscellaneous []  - External ear exam 0 [name], [name] ([id number]) []  - Specimen Collection (cultures, biopsies, blood, body fluids, etc.) 0 []  - Specimen(s) / Culture(s) sent or taken to Lab for analysis 0 []  - Patient Transfer (multiple staff / Harrel Lemon Lift / Similar devices) 0 []  - Simple Staple / Suture removal (25 or less) 0 []  - Complex Staple / Suture removal (26 or more) 0 []  - Hypo / Hyperglycemic Management (close monitor of Blood Glucose) 0 []  - Ankle / Brachial Index (ABI) - do not check if billed separately 0 X - Vital Signs 1 5 Has the patient been seen at the hospital within the last three years: Yes Total Score: 80 Level Of Care: New/Established - Level 3 Electronic Signature(s) Signed: 06/04/2015 11:20:58 AM By: [name] Companion MD Entered By: [name] on 06/04/2015 11:20:58 [name] ([id number]) -------------------------------------------------------------------------------- Encounter Discharge Information Details Patient Name: [name] Date of Service: 06/04/2015 10:15 AM Medical Record Number: [id number] Patient Account Number: [id number] Date of Birth/Sex: 06-04-1956 (59 y.o. Female) Treating RN: Baruch Gouty, RN, BSN, [name] Primary Care Physician: [name] Other Clinician: Referring Physician: Ricke [name] Treating Physician/Extender: [name] in Treatment: 5 Encounter Discharge Information  Items Discharge Pain Level: 0 Discharge Condition: Stable Ambulatory Status: Ambulatory Discharge Destination: Home Private Transportation: Auto Accompanied By: self Schedule Follow-up Appointment: No Medication Reconciliation completed and No provided to Patient/Care Dellene Mcgroarty: Clinical Summary of Care: Electronic Signature(s) Signed: 06/04/2015 11:23:59 AM By: [name] Companion MD Entered By: [name] on 06/04/2015 11:23:58 [name] ([id number]) -------------------------------------------------------------------------------- Lower Extremity Assessment Details Patient Name: [name] Date of Service: 06/04/2015 10:15 AM Medical Record Number: [id number] Patient Account Number: [id number] Date of Birth/Sex: 08-31-1956 (59 y.o. Female) Treating RN: Baruch Gouty, RN, BSN, [name] Primary Care Physician: [name] Other Clinician: Referring Physician: Ricke [name] Treating Physician/Extender: [name] in Treatment: 5 Electronic Signature(s) Signed: 06/04/2015 11:19:11 AM By: [name] Companion MD Signed: 06/04/2015 5:24:15 PM By: Regan Lemming BSN, RN Entered By: [name] on 06/04/2015 11:19:11 [name] ([id number]) -------------------------------------------------------------------------------- Multi Wound Chart Details Patient Name: [name] Date of Service: 06/04/2015 10:15 AM Medical Record Number: [id number] Patient Account Number: [id number] Date of Birth/Sex: 1955/12/05 (59 y.o. Female) Treating RN: Baruch Gouty, RN, BSN, [name] Primary Care Physician: [name] Other Clinician: Referring Physician: Ricke [name] Treating Physician/Extender: [name] in Treatment: 5 Vital Signs Height(in): 65 Pulse(bpm): 83 Weight(lbs): 168 Blood Pressure 104/62 (mmHg): Body Mass Index(BMI): 28 Temperature(F): 98.1 Respiratory Rate 16 (breaths/min): Photos: [1:No Photos] [N/A:N/A] Wound Location: [1:Right Upper Arm - Posterior]  [N/A:N/A] Wounding Event: [1:Gradually Appeared] [N/A:N/A] Primary Etiology: [1:Lymphedema] [N/A:N/A] Comorbid History: [1:Deep Vein Thrombosis, Hypertension, Received Chemotherapy, Received Radiation] [N/A:N/A] Date Acquired: [1:03/23/2015] [N/A:N/A] Weeks of Treatment: [1:5] [N/A:N/A] Wound Status: [1:Open] [N/A:N/A] Measurements L x W x D 15x24x0.2 [N/A:N/A] (cm) Area (cm) : [3:419.379] [N/A:N/A] Volume (cm) : [1:56.549] [N/A:N/A] % Reduction in Area: [1:-77.80%] [N/A:N/A] % Reduction in Volume: -77.80% [N/A:N/A] Classification: [1:Partial Thickness] [N/A:N/A] Exudate Amount: [1:Small] [N/A:N/A] Exudate Type: [1:Purulent] [N/A:N/A] Exudate Color: [1:yellow, brown, green] [N/A:N/A] Foul Odor After [1:Yes] [N/A:N/A] Cleansing: Odor Anticipated Due to No [  N/A:N/A] Product Use: Wound Margin: [1:Distinct, outline attached] [N/A:N/A] Granulation Amount: [1:Small (1-33%)] [N/A:N/A] Granulation Quality: [1:Pink, Pale] [N/A:N/A] Necrotic Amount: [1:Large (67-100%)] [N/A:N/A] Exposed Structures: Fascia: No N/A N/A Fat: No Tendon: No Muscle: No Joint: No Bone: No Limited to Skin Breakdown Epithelialization: Small (1-33%) N/A N/A Periwound Skin Texture: Edema: Yes N/A N/A Excoriation: No Induration: No Callus: No Crepitus: No Fluctuance: No Friable: No Rash: No Scarring: No Periwound Skin Moist: Yes N/A N/A Moisture: Maceration: No Dry/Scaly: No Periwound Skin Color: Atrophie Blanche: No N/A N/A Cyanosis: No Ecchymosis: No Erythema: No Hemosiderin Staining: No Mottled: No Pallor: No Rubor: No Temperature: No Abnormality N/A N/A Tenderness on No N/A N/A Palpation: Wound Preparation: Ulcer Cleansing: N/A N/A Rinsed/Irrigated with Saline Topical Anesthetic Applied: None Treatment Notes Wound #1 (Right, Posterior Upper Arm) 1. Cleansed with: Clean wound with Normal Saline 4. Dressing Applied: Santyl Ointment 5. Secondary Dressing Applied Guaze, ABD and  kerlix/Conform 7. Secured with Tape Ayala, Wava ([id number]) Notes Fish net stocking applied to help keep dressing in place. Electronic Signature(s) Signed: 06/04/2015 11:19:43 AM By: [name] Companion MD Entered By: [name] on 06/04/2015 11:19:43 [name] ([id number]) -------------------------------------------------------------------------------- [facility] Details Patient Name: [name] Date of Service: 06/04/2015 10:15 AM Medical Record Number: [id number] Patient Account Number: [id number] Date of Birth/Sex: 05-Apr-1956 (59 y.o. Female) Treating RN: Afful, RN, BSN, [name] Primary Care Physician: [name] Other Clinician: Referring Physician: Ricke [name] Treating Physician/Extender: [name] in Treatment: 5 Active Inactive Malignancy/Atypical Etiology Nursing Diagnoses: Knowledge deficit related to disease process and management of atypical ulcer etiology Goals: Patient/caregiver will verbalize understanding of disease process and disease management of atypical ulcer etiology Date Initiated: 04/24/2015 Goal Status: Active Interventions: Provide education on atypical ulcer etiologies Treatment Activities: Biopsy : 05/08/2015 Notes: Orientation to the Wound Care Program Nursing Diagnoses: Knowledge deficit related to the wound healing center program Goals: Patient/caregiver will verbalize understanding of the [place] Program Date Initiated: 04/24/2015 Goal Status: Active Interventions: Provide education on orientation to the wound center Notes: Wound/Skin Impairment Nursing Diagnoses: Impaired tissue integrity Deans, Bill ([id number]) Goals: Ulcer/skin breakdown will have a volume reduction of 30% by week 4 Date Initiated: 04/24/2015 Goal Status: Active Interventions: Assess ulceration(s) every visit Notes: Electronic Signature(s) Signed: 06/04/2015 11:19:27 AM By: [name] Companion MD Signed: 06/04/2015  5:24:15 PM By: Regan Lemming BSN, RN Entered By: [name] on 06/04/2015 11:19:26 [name] ([id number]) -------------------------------------------------------------------------------- Pain Assessment Details Patient Name: [name] Date of Service: 06/04/2015 10:15 AM Medical Record Number: [id number] Patient Account Number: [id number] Date of Birth/Sex: [birth year] (59 y.o. Female) Treating RN: Baruch Gouty, RN, BSN, [name] Primary Care Physician: [name] Other Clinician: Referring Physician: Ricke [name] Treating Physician/Extender: [name] in Treatment: 5 Active Problems Location of Pain Severity and Description of Pain Patient Has Paino No Site Locations Pain Management and Medication Current Pain Management: Notes metastatic breast Ca. Withulcer right arm Electronic Signature(s) Signed: 06/04/2015 11:18:55 AM By: [name] Companion MD Signed: 06/04/2015 5:24:15 PM By: Regan Lemming BSN, RN Entered By: [name] on 06/04/2015 11:18:55 [name] ([id number]) -------------------------------------------------------------------------------- Patient/Caregiver Education Details Patient Name: [name] Date of Service: 06/04/2015 10:15 AM Medical Record Number: [id number] Patient Account Number: [id number] Date of Birth/Gender: 07/30/1956 (59 y.o. Female) Treating RN: Baruch Gouty, RN, BSN, [name] Primary Care Physician: [name] Other Clinician: Referring Physician: Ricke [name] Treating Physician/Extender: [name] in Treatment: 5 Education Assessment Education Provided To: Patient Education Topics Provided Malignant/Atypical Wounds: Methods: Explain/Verbal Responses: State content correctly Welcome To  [facility]: Methods: Explain/Verbal Responses: State content correctly Electronic Signature(s) Signed: 06/04/2015 11:24:07 AM By: [name] Companion MD Entered By: [name] on 06/04/2015 11:24:07 [name]  ([id number]) -------------------------------------------------------------------------------- Wound Assessment Details Patient Name: [name] Date of Service: 06/04/2015 10:15 AM Medical Record Number: [id number] Patient Account Number: [id number] Date of Birth/Sex: 1956-05-16 (59 y.o. Female) Treating RN: Afful, RN, BSN, [name] Primary Care Physician: [name] Other Clinician: Referring Physician: Ricke [name] Treating Physician/Extender: [name] in Treatment: 5 Wound Status Wound Number: 1 Primary Lymphedema Etiology: Wound Location: Right Upper Arm - Posterior Wound Open Wounding Event: Gradually Appeared Status: Date Acquired: 03/23/2015 Comorbid Deep Vein Thrombosis, Hypertension, Weeks Of Treatment: 5 History: Received Chemotherapy, Received Clustered Wound: No Radiation Photos Photo Uploaded By: Regan Lemming on 06/04/2015 17:15:13 Wound Measurements Length: (cm) 15 Width: (cm) 24 Depth: (cm) 0.2 Area: (cm) 282.743 Volume: (cm) 56.549 % Reduction in Area: -77.8% % Reduction in Volume: -77.8% Epithelialization: Small (1-33%) Tunneling: No Undermining: No Wound Description Classification: Partial Thickness Foul Odor Af Wound Margin: Distinct, outline attached Due to Produ Exudate Amount: Small Exudate Type: Purulent Exudate Color: yellow, brown, green ter Cleansing: Yes ct Use: No Wound Bed Granulation Amount: Small (1-33%) Exposed Structure Granulation Quality: Pink, Pale Fascia Exposed: No Necrotic Amount: Large (67-100%) Fat Layer Exposed: No Necrotic Quality: Adherent Slough Tendon Exposed: No [name], [name] ([id number]) Muscle Exposed: No Joint Exposed: No Bone Exposed: No Limited to Skin Breakdown Periwound Skin Texture Texture Color No Abnormalities Noted: No No Abnormalities Noted: No Callus: No Atrophie Blanche: No Crepitus: No Cyanosis: No Excoriation: No Ecchymosis: No Fluctuance: No Erythema: No Friable:  No Hemosiderin Staining: No Induration: No Mottled: No Localized Edema: Yes Pallor: No Rash: No Rubor: No Scarring: No Temperature / Pain Moisture Temperature: No Abnormality No Abnormalities Noted: No Dry / Scaly: No Maceration: No Moist: Yes Wound Preparation Ulcer Cleansing: Rinsed/Irrigated with Saline Topical Anesthetic Applied: None Treatment Notes Wound #1 (Right, Posterior Upper Arm) 1. Cleansed with: Clean wound with Normal Saline 4. Dressing Applied: Santyl Ointment 5. Secondary Dressing Applied Guaze, ABD and kerlix/Conform 7. Secured with Tape Notes Fish net stocking applied to help keep dressing in place. Electronic Signature(s) Signed: 06/04/2015 5:24:15 PM By: Regan Lemming BSN, RN Entered By: Regan Lemming on 06/04/2015 10:23:21 [name] ([id number]) -------------------------------------------------------------------------------- Vitals Details Patient Name: [name] Date of Service: 06/04/2015 10:15 AM Medical Record Number: [id number] Patient Account Number: [id number] Date of Birth/Sex: March 02, 1956 (59 y.o. Female) Treating RN: Afful, RN, BSN, [place] Primary Care Physician: [name] Other Clinician: Referring Physician: Ricke [name] Treating Physician/Extender: [name] in Treatment: 5 Vital Signs Time Taken: 10:19 Temperature (F): 98.1 Height (in): 65 Pulse (bpm): 83 Weight (lbs): 168 Respiratory Rate (breaths/min): 16 Body Mass Index (BMI): 28 Blood Pressure (mmHg): 104/62 Reference Range: 80 - 120 mg / dl Electronic Signature(s) Signed: 06/04/2015 11:19:02 AM By: [name] Companion MD Entered By: [name] on 06/04/2015 11:19:02

## 2015-06-09 DIAGNOSIS — D0512 Intraductal carcinoma in situ of left breast: Secondary | ICD-10-CM | POA: Diagnosis not present

## 2015-06-09 DIAGNOSIS — C50911 Malignant neoplasm of unspecified site of right female breast: Secondary | ICD-10-CM | POA: Diagnosis not present

## 2015-06-09 DIAGNOSIS — I10 Essential (primary) hypertension: Secondary | ICD-10-CM | POA: Diagnosis not present

## 2015-06-12 DIAGNOSIS — Z01810 Encounter for preprocedural cardiovascular examination: Secondary | ICD-10-CM | POA: Diagnosis not present

## 2015-06-15 ENCOUNTER — Telehealth: Payer: Self-pay | Admitting: *Deleted

## 2015-06-15 DIAGNOSIS — F17210 Nicotine dependence, cigarettes, uncomplicated: Secondary | ICD-10-CM | POA: Diagnosis present

## 2015-06-15 DIAGNOSIS — Z803 Family history of malignant neoplasm of breast: Secondary | ICD-10-CM | POA: Diagnosis not present

## 2015-06-15 DIAGNOSIS — Z170 Estrogen receptor positive status [ER+]: Secondary | ICD-10-CM | POA: Diagnosis not present

## 2015-06-15 DIAGNOSIS — C50911 Malignant neoplasm of unspecified site of right female breast: Secondary | ICD-10-CM | POA: Diagnosis present

## 2015-06-15 DIAGNOSIS — I10 Essential (primary) hypertension: Secondary | ICD-10-CM | POA: Diagnosis present

## 2015-06-15 DIAGNOSIS — C7951 Secondary malignant neoplasm of bone: Secondary | ICD-10-CM | POA: Diagnosis present

## 2015-06-15 DIAGNOSIS — I890 Lymphedema, not elsewhere classified: Secondary | ICD-10-CM | POA: Diagnosis not present

## 2015-06-15 DIAGNOSIS — E872 Acidosis: Secondary | ICD-10-CM | POA: Diagnosis present

## 2015-06-15 DIAGNOSIS — Z79899 Other long term (current) drug therapy: Secondary | ICD-10-CM | POA: Diagnosis not present

## 2015-06-15 DIAGNOSIS — A419 Sepsis, unspecified organism: Secondary | ICD-10-CM | POA: Diagnosis present

## 2015-06-15 DIAGNOSIS — R42 Dizziness and giddiness: Secondary | ICD-10-CM | POA: Diagnosis not present

## 2015-06-15 DIAGNOSIS — C7931 Secondary malignant neoplasm of brain: Secondary | ICD-10-CM | POA: Diagnosis not present

## 2015-06-15 DIAGNOSIS — C50919 Malignant neoplasm of unspecified site of unspecified female breast: Secondary | ICD-10-CM | POA: Diagnosis not present

## 2015-06-15 DIAGNOSIS — Z01818 Encounter for other preprocedural examination: Secondary | ICD-10-CM | POA: Diagnosis not present

## 2015-06-15 NOTE — Telephone Encounter (Signed)
This RN has received multiple VMs from patient since 8/4- note pt went to [place] in [place] on 06/08/2015.  Message left this am by [name] requested a return call " it is urgent"  Call were returned to pt with this RN receiving identified VM's- informed pt to call and ask to speak to the [facility] nurse.

## 2015-06-16 DIAGNOSIS — C7931 Secondary malignant neoplasm of brain: Secondary | ICD-10-CM | POA: Diagnosis not present

## 2015-06-16 DIAGNOSIS — R42 Dizziness and giddiness: Secondary | ICD-10-CM | POA: Diagnosis not present

## 2015-06-16 DIAGNOSIS — I890 Lymphedema, not elsewhere classified: Secondary | ICD-10-CM | POA: Diagnosis not present

## 2015-06-16 DIAGNOSIS — C50919 Malignant neoplasm of unspecified site of unspecified female breast: Secondary | ICD-10-CM | POA: Diagnosis not present

## 2015-06-17 ENCOUNTER — Ambulatory Visit: Payer: Commercial Managed Care - PPO | Admitting: Nurse Practitioner

## 2015-06-17 ENCOUNTER — Telehealth: Payer: Self-pay | Admitting: *Deleted

## 2015-06-17 NOTE — Telephone Encounter (Signed)
TC from [place]. Pt is returning from [facility] in [place] today with recommendation for hospice care. Pt/pt's family has contacted hospice for referral. Hospice calling to see if Dr. Jana Hakim is agreeable to being pt's attending for Hospice. Please advise.

## 2015-06-18 ENCOUNTER — Ambulatory Visit: Payer: Commercial Managed Care - PPO | Admitting: Surgery

## 2015-06-18 ENCOUNTER — Other Ambulatory Visit: Payer: Self-pay | Admitting: *Deleted

## 2015-06-18 DIAGNOSIS — C50911 Malignant neoplasm of unspecified site of right female breast: Secondary | ICD-10-CM | POA: Diagnosis not present

## 2015-06-18 MED ORDER — OXYCODONE HCL 10 MG PO TABS: ORAL_TABLET | ORAL | Status: DC

## 2015-06-18 MED ORDER — OXYMORPHONE HCL 10 MG PO TABS: 10.0000 mg | ORAL_TABLET | Freq: Two times a day (BID) | ORAL | Status: DC | PRN

## 2015-06-18 NOTE — Telephone Encounter (Signed)
This RN spoke with [name] per message left yesterday.  Due to [name] being out of the office- Dr [name] approved referral for assessment and admission.  Per this discussion- attending MD will be [name] per continuity of care.

## 2015-06-19 DIAGNOSIS — C50911 Malignant neoplasm of unspecified site of right female breast: Secondary | ICD-10-CM | POA: Diagnosis not present

## 2015-06-20 DIAGNOSIS — C50911 Malignant neoplasm of unspecified site of right female breast: Secondary | ICD-10-CM | POA: Diagnosis not present

## 2015-06-21 DIAGNOSIS — C50911 Malignant neoplasm of unspecified site of right female breast: Secondary | ICD-10-CM | POA: Diagnosis not present

## 2015-06-22 DIAGNOSIS — C50911 Malignant neoplasm of unspecified site of right female breast: Secondary | ICD-10-CM | POA: Diagnosis not present

## 2015-06-23 DIAGNOSIS — C50911 Malignant neoplasm of unspecified site of right female breast: Secondary | ICD-10-CM | POA: Diagnosis not present

## 2015-06-24 ENCOUNTER — Telehealth: Payer: Self-pay | Admitting: *Deleted

## 2015-06-24 ENCOUNTER — Telehealth: Payer: Self-pay

## 2015-06-24 ENCOUNTER — Other Ambulatory Visit: Payer: Self-pay | Admitting: *Deleted

## 2015-06-24 ENCOUNTER — Other Ambulatory Visit: Payer: Self-pay

## 2015-06-24 DIAGNOSIS — C50911 Malignant neoplasm of unspecified site of right female breast: Secondary | ICD-10-CM | POA: Diagnosis not present

## 2015-06-24 DIAGNOSIS — C50211 Malignant neoplasm of upper-inner quadrant of right female breast: Secondary | ICD-10-CM

## 2015-06-24 MED ORDER — OXYCODONE HCL 10 MG PO TABS: ORAL_TABLET | ORAL | Status: DC

## 2015-06-24 NOTE — Telephone Encounter (Signed)
Received message from [place], [place] @ [facility] requesting refill of Oxycodone 10 mg.   Desk nurse Terri notified.  Faxed signed script by Dr. Jana Hakim to [place] on [facility] as per hospice nurse request. [place]   Fax      [phone].

## 2015-06-24 NOTE — Telephone Encounter (Signed)
Orders dtd 06/22/15 faxed to hospice.  Sent to scan.

## 2015-06-25 DIAGNOSIS — C50911 Malignant neoplasm of unspecified site of right female breast: Secondary | ICD-10-CM | POA: Diagnosis not present

## 2015-06-26 DIAGNOSIS — C50911 Malignant neoplasm of unspecified site of right female breast: Secondary | ICD-10-CM | POA: Diagnosis not present

## 2015-06-27 DIAGNOSIS — C50911 Malignant neoplasm of unspecified site of right female breast: Secondary | ICD-10-CM | POA: Diagnosis not present

## 2015-06-28 DIAGNOSIS — C50911 Malignant neoplasm of unspecified site of right female breast: Secondary | ICD-10-CM | POA: Diagnosis not present

## 2015-06-29 DIAGNOSIS — C50911 Malignant neoplasm of unspecified site of right female breast: Secondary | ICD-10-CM | POA: Diagnosis not present

## 2015-06-30 ENCOUNTER — Telehealth: Payer: Self-pay | Admitting: *Deleted

## 2015-06-30 DIAGNOSIS — C50911 Malignant neoplasm of unspecified site of right female breast: Secondary | ICD-10-CM | POA: Diagnosis not present

## 2015-06-30 NOTE — Telephone Encounter (Signed)
Thank you-   This RN called and spoke with pt's home nurse - Raquelle was unable to speak on the phone.  Per discussion verified pt's desire for Dr [name] to be attending with hospice.  This RN also requested nurse to let Micky know we are here if she has questions or concerns

## 2015-06-30 NOTE — Telephone Encounter (Signed)
"  Patient has decided to switch over to Dr. [name].  For our records we have made this change from Dr. Jana Hakim to Dr. [name]. If any questions or further explanation please call me at [phone]."

## 2015-07-01 DIAGNOSIS — C50911 Malignant neoplasm of unspecified site of right female breast: Secondary | ICD-10-CM | POA: Diagnosis not present

## 2015-07-02 DIAGNOSIS — C50911 Malignant neoplasm of unspecified site of right female breast: Secondary | ICD-10-CM | POA: Diagnosis not present

## 2015-07-03 DIAGNOSIS — C50911 Malignant neoplasm of unspecified site of right female breast: Secondary | ICD-10-CM | POA: Diagnosis not present

## 2015-07-04 DIAGNOSIS — C50911 Malignant neoplasm of unspecified site of right female breast: Secondary | ICD-10-CM | POA: Diagnosis not present

## 2015-07-05 DIAGNOSIS — C50911 Malignant neoplasm of unspecified site of right female breast: Secondary | ICD-10-CM | POA: Diagnosis not present

## 2015-07-06 DIAGNOSIS — C50911 Malignant neoplasm of unspecified site of right female breast: Secondary | ICD-10-CM | POA: Diagnosis not present

## 2015-07-07 DIAGNOSIS — C50911 Malignant neoplasm of unspecified site of right female breast: Secondary | ICD-10-CM | POA: Diagnosis not present

## 2015-07-08 DIAGNOSIS — C50911 Malignant neoplasm of unspecified site of right female breast: Secondary | ICD-10-CM | POA: Diagnosis not present

## 2015-07-09 DIAGNOSIS — C50911 Malignant neoplasm of unspecified site of right female breast: Secondary | ICD-10-CM | POA: Diagnosis not present

## 2015-07-10 DIAGNOSIS — C50911 Malignant neoplasm of unspecified site of right female breast: Secondary | ICD-10-CM | POA: Diagnosis not present

## 2015-07-11 DIAGNOSIS — C50911 Malignant neoplasm of unspecified site of right female breast: Secondary | ICD-10-CM | POA: Diagnosis not present

## 2015-07-12 DIAGNOSIS — C50911 Malignant neoplasm of unspecified site of right female breast: Secondary | ICD-10-CM | POA: Diagnosis not present

## 2015-07-13 DIAGNOSIS — C50911 Malignant neoplasm of unspecified site of right female breast: Secondary | ICD-10-CM | POA: Diagnosis not present

## 2015-07-14 ENCOUNTER — Other Ambulatory Visit: Payer: Self-pay | Admitting: Oncology

## 2015-07-14 DIAGNOSIS — C50911 Malignant neoplasm of unspecified site of right female breast: Secondary | ICD-10-CM | POA: Diagnosis not present

## 2015-07-14 NOTE — Telephone Encounter (Signed)
Writer called hospice regarding medication refill for dyazide.  Spoke with triage RN who stated that patient is being followed by her PCP Dr. [name] and he has discontinued this medication.

## 2015-07-15 DIAGNOSIS — C50911 Malignant neoplasm of unspecified site of right female breast: Secondary | ICD-10-CM | POA: Diagnosis not present

## 2015-07-16 DIAGNOSIS — C50911 Malignant neoplasm of unspecified site of right female breast: Secondary | ICD-10-CM | POA: Diagnosis not present

## 2015-07-17 DIAGNOSIS — C50911 Malignant neoplasm of unspecified site of right female breast: Secondary | ICD-10-CM | POA: Diagnosis not present

## 2015-07-18 DIAGNOSIS — C50911 Malignant neoplasm of unspecified site of right female breast: Secondary | ICD-10-CM | POA: Diagnosis not present

## 2015-07-19 DIAGNOSIS — C50911 Malignant neoplasm of unspecified site of right female breast: Secondary | ICD-10-CM | POA: Diagnosis not present

## 2015-07-20 DIAGNOSIS — C50911 Malignant neoplasm of unspecified site of right female breast: Secondary | ICD-10-CM | POA: Diagnosis not present

## 2015-07-21 DIAGNOSIS — C50911 Malignant neoplasm of unspecified site of right female breast: Secondary | ICD-10-CM | POA: Diagnosis not present

## 2015-07-22 DIAGNOSIS — C50911 Malignant neoplasm of unspecified site of right female breast: Secondary | ICD-10-CM | POA: Diagnosis not present

## 2015-07-23 DIAGNOSIS — C50911 Malignant neoplasm of unspecified site of right female breast: Secondary | ICD-10-CM | POA: Diagnosis not present

## 2015-07-24 DIAGNOSIS — C50911 Malignant neoplasm of unspecified site of right female breast: Secondary | ICD-10-CM | POA: Diagnosis not present

## 2015-07-25 DIAGNOSIS — C50911 Malignant neoplasm of unspecified site of right female breast: Secondary | ICD-10-CM | POA: Diagnosis not present

## 2015-07-26 DIAGNOSIS — C50911 Malignant neoplasm of unspecified site of right female breast: Secondary | ICD-10-CM | POA: Diagnosis not present

## 2015-07-27 DIAGNOSIS — C50911 Malignant neoplasm of unspecified site of right female breast: Secondary | ICD-10-CM | POA: Diagnosis not present

## 2015-07-28 DIAGNOSIS — C50911 Malignant neoplasm of unspecified site of right female breast: Secondary | ICD-10-CM | POA: Diagnosis not present

## 2015-07-29 ENCOUNTER — Ambulatory Visit: Payer: Commercial Managed Care - PPO | Admitting: Oncology

## 2015-07-29 DIAGNOSIS — C50911 Malignant neoplasm of unspecified site of right female breast: Secondary | ICD-10-CM | POA: Diagnosis not present

## 2015-07-30 DIAGNOSIS — C50911 Malignant neoplasm of unspecified site of right female breast: Secondary | ICD-10-CM | POA: Diagnosis not present

## 2015-07-31 DIAGNOSIS — C50911 Malignant neoplasm of unspecified site of right female breast: Secondary | ICD-10-CM | POA: Diagnosis not present

## 2015-08-01 DIAGNOSIS — C50911 Malignant neoplasm of unspecified site of right female breast: Secondary | ICD-10-CM | POA: Diagnosis not present

## 2015-08-02 DIAGNOSIS — C50911 Malignant neoplasm of unspecified site of right female breast: Secondary | ICD-10-CM | POA: Diagnosis not present

## 2015-08-02 IMAGING — CT CT CHEST W/ CM
2 of 3 series · 15 of 36 positions shown, 18 images · IV contrast (OMNIPAQUE)
Comparison: 02/25/2014

CLINICAL DATA: Followup breast cancer. DVT and lymph edema and
right arm.

EXAM:
CT CHEST WITH CONTRAST
TECHNIQUE: Multidetector CT imaging of the chest was performed during
intravenous contrast administration.
CONTRAST:  100mL OMNIPAQUE IOHEXOL 300 MG/ML  SOLN

[Series 2: chest with st · axial · 0.83mm/px · z∈[-327,-47]mm · 12 of 66 slices shown, 15 images]
[im 5/66  mediastinal]
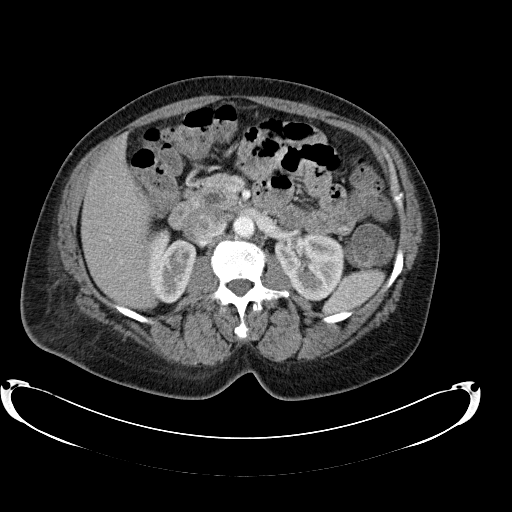
[im 5/66  lung]
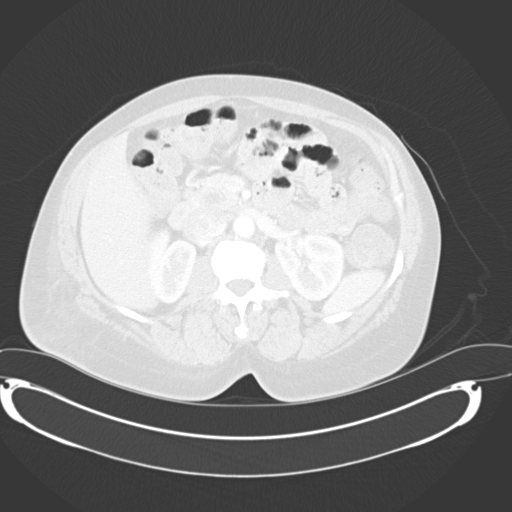
[im 10/66  lung]
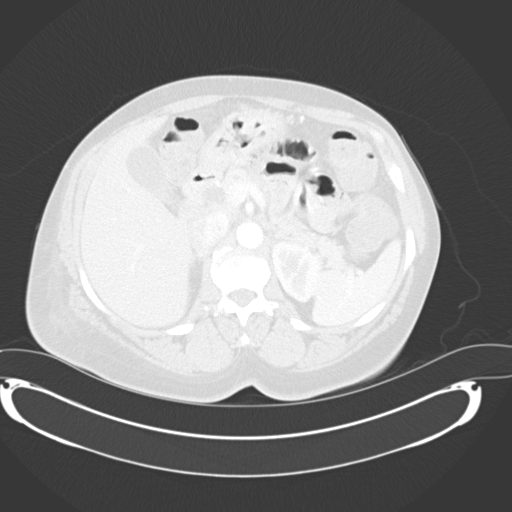
[im 15/66  lung]
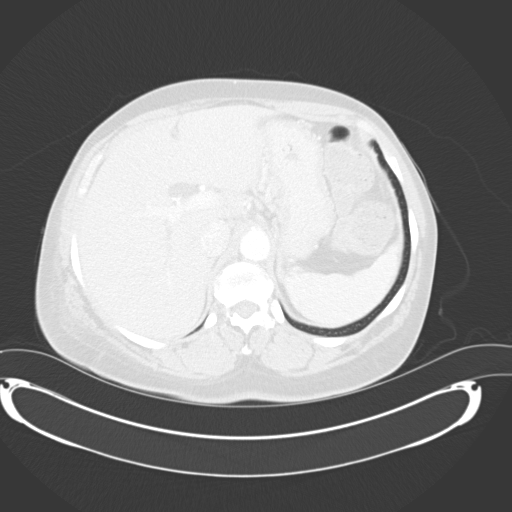
[im 20/66  lung]
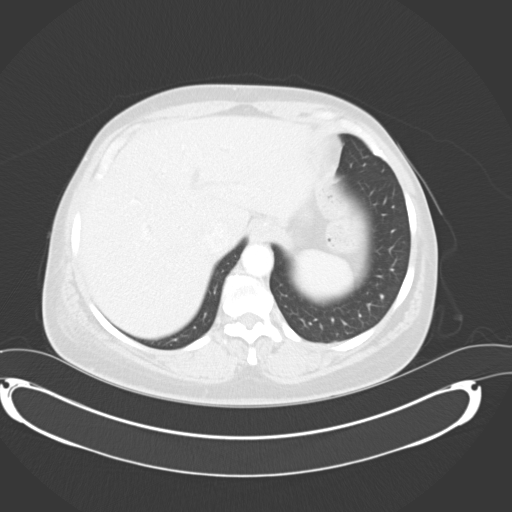
[im 25/66  mediastinal]
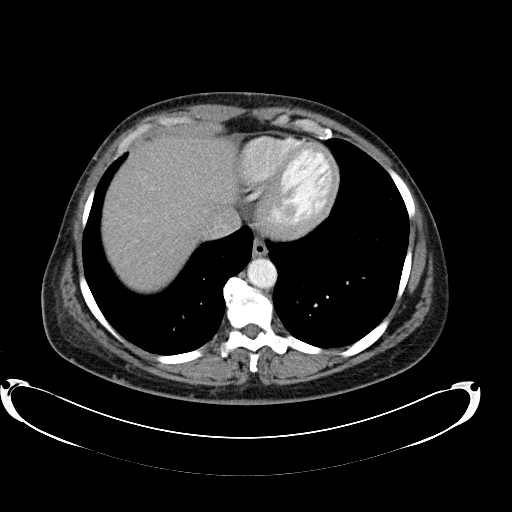
[im 25/66  lung]
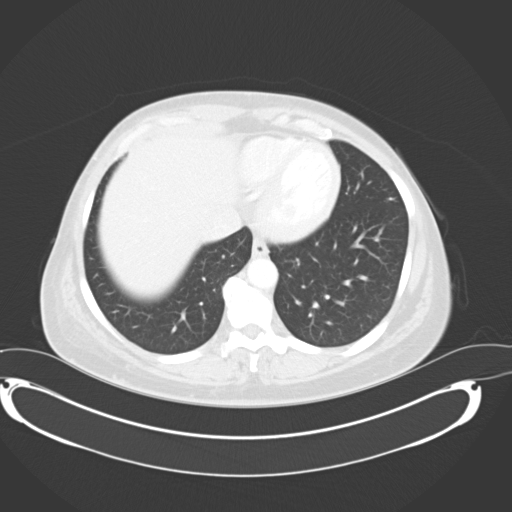
[im 29/66  lung]
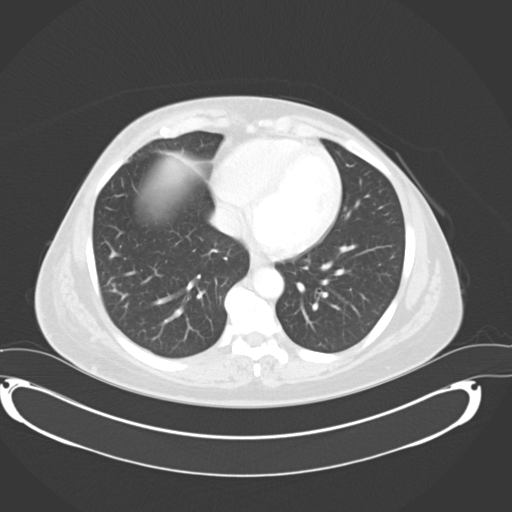
[im 37/66  lung]
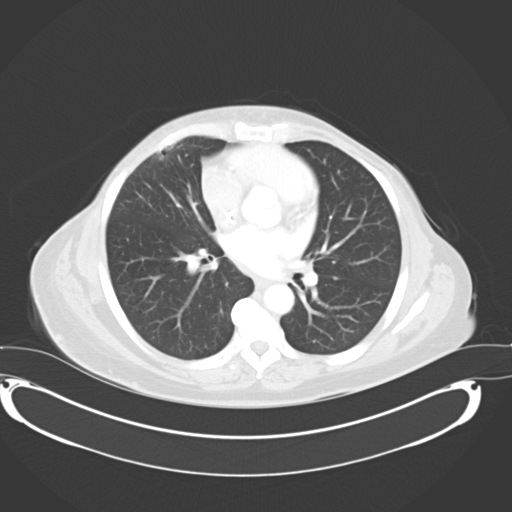
[im 41/66  lung]
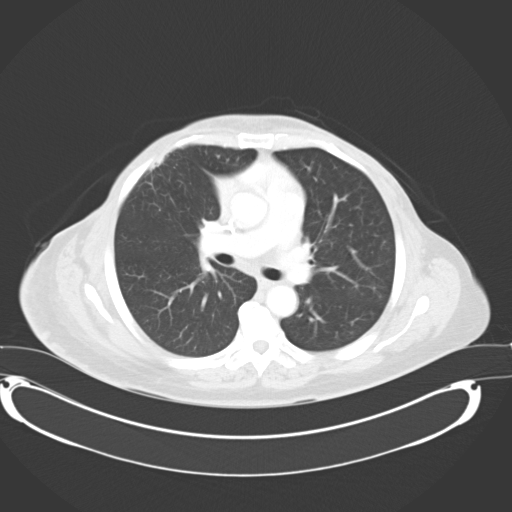
[im 46/66  mediastinal]
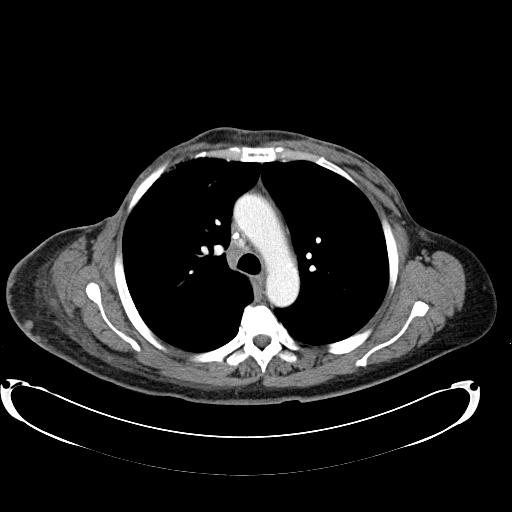
[im 46/66  lung]
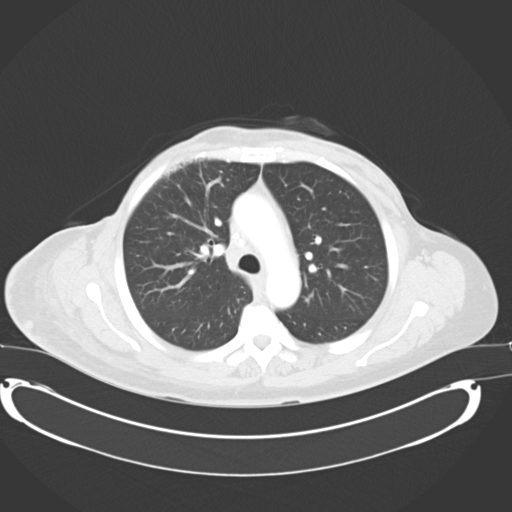
[im 51/66  lung]
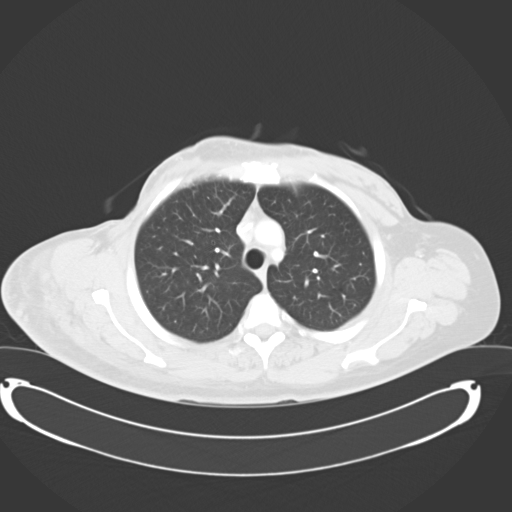
[im 56/66  lung]
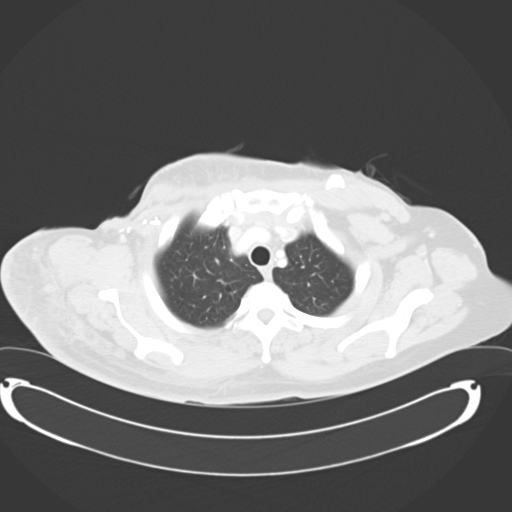
[im 61/66  lung]
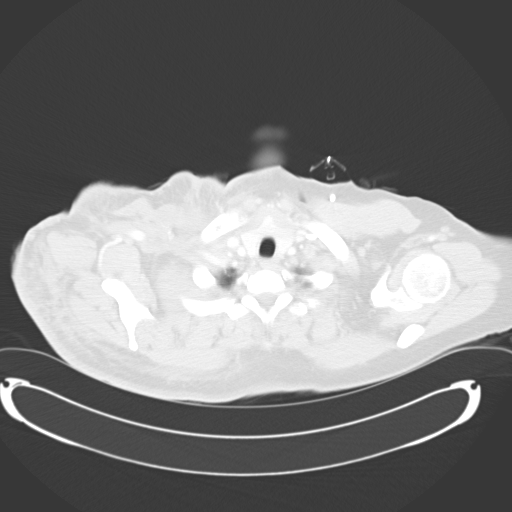

[Series 602: <mpr thick range> · coronal · 0.83mm/px · 3 of 92 slices shown]
[im 19/92  lung]
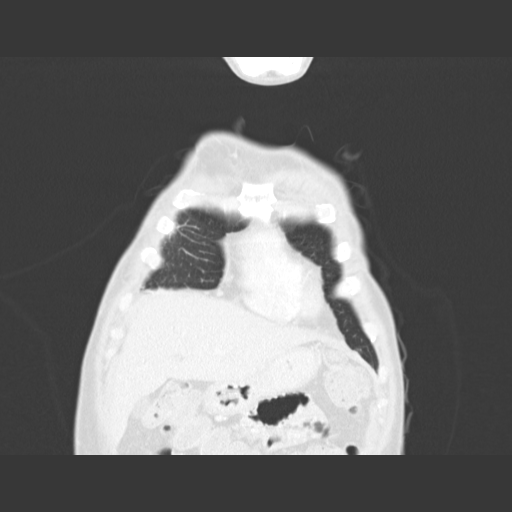
[im 37/92  lung]
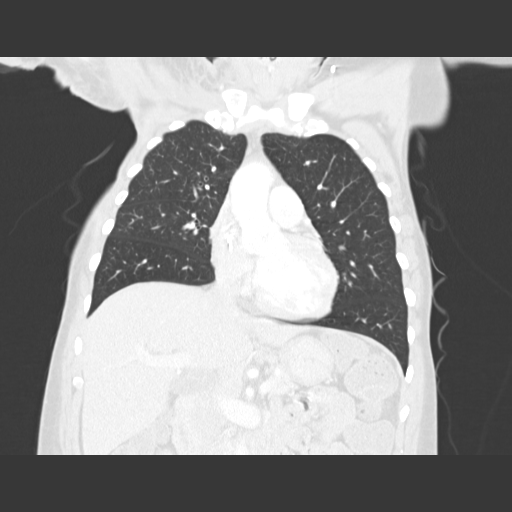
[im 55/92  lung]
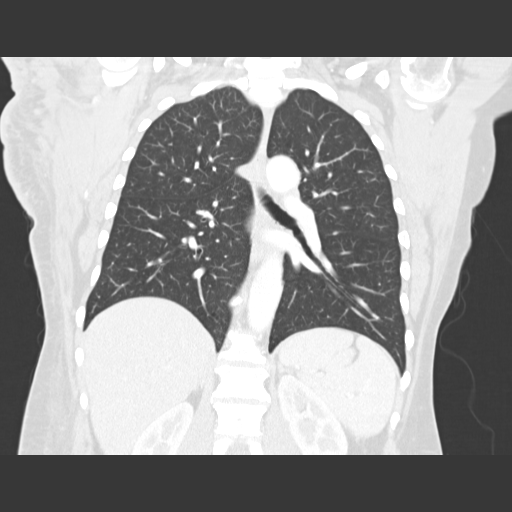

[15 of 36 positions shown; findings below may reference images not displayed]

FINDINGS: Mediastinum: Normal heart size. No pericardial effusion identified.
Aortic atherosclerosis noted. There also calcifications within the
LAD Coronary artery. The trachea appears patent and is midline.
Normal appearance of the esophagus. There is no mediastinal or hilar
adenopathy identified. Status post bilateral mastectomy. Skin
thickening and subcutaneous edema is identified within the soft
tissues of the right upper chest and right upper extremity.

Lungs/Pleura: No pleural effusion. There is subpleural fibrosis
within the anterior right upper lobe compatible with changes of
external beam radiation. Stable 5 mm right lower lobe lung nodule,
image 33/series 5. There is a small subpleural nodule along the
oblique fissure which measures 4 mm, image 30/series 5. Although not
seen on prior exam, this has an appearance favoring small fissural
lymph node.

Upper Abdomen: Low-attenuation structure within the left lobe of
liver measures 9 mm and is unchanged. The gallbladder appears
normal. There is mild intrahepatic bile duct dilatation. The common
bile duct is increased in caliber measuring 1.3 cm, image 55/series
2. This is stable from previous exam. The visualized portions of the
pancreas are unremarkable. Spleen negative. Normal adrenal glands.
The visualized portions of the kidney ears are normal.

Musculoskeletal: Review of the visualized bony structures is
negative for aggressive lytic or sclerotic bone lesion. Degenerative
disc disease noted within the thoracic spine.
IMPRESSION: 1. No significant change compared with 02/25/2014.
2. Small nodule along the oblique fissure is favored to represent a
intrapulmonary lymph node. Attention on follow-up imaging advise.
3. Aortic atherosclerosis and LAD Coronary artery calcification.

## 2015-08-03 DIAGNOSIS — C50911 Malignant neoplasm of unspecified site of right female breast: Secondary | ICD-10-CM | POA: Diagnosis not present

## 2015-08-04 DIAGNOSIS — C50911 Malignant neoplasm of unspecified site of right female breast: Secondary | ICD-10-CM | POA: Diagnosis not present

## 2015-08-05 DIAGNOSIS — C50911 Malignant neoplasm of unspecified site of right female breast: Secondary | ICD-10-CM | POA: Diagnosis not present

## 2015-08-06 DIAGNOSIS — C50911 Malignant neoplasm of unspecified site of right female breast: Secondary | ICD-10-CM | POA: Diagnosis not present

## 2015-08-07 DIAGNOSIS — C50911 Malignant neoplasm of unspecified site of right female breast: Secondary | ICD-10-CM | POA: Diagnosis not present

## 2015-08-08 DIAGNOSIS — C50911 Malignant neoplasm of unspecified site of right female breast: Secondary | ICD-10-CM | POA: Diagnosis not present

## 2015-08-10 DIAGNOSIS — Z23 Encounter for immunization: Secondary | ICD-10-CM | POA: Diagnosis not present

## 2015-08-23 ENCOUNTER — Other Ambulatory Visit: Payer: Self-pay | Admitting: Oncology

## 2015-09-08 DIAGNOSIS — C50911 Malignant neoplasm of unspecified site of right female breast: Secondary | ICD-10-CM | POA: Diagnosis not present

## 2015-09-25 IMAGING — MR MR SHOULDER*R* WO/W CM
5 of 9 series · 18 of 40 positions shown · IV contrast (Yes)
Comparison: CT scans of the chest dated 03/19/2015 and 02/25/2014

CLINICAL DATA: Right shoulder pain and swelling for 2 months.
History of right breast cancer.

EXAM:
MRI OF THE RIGHT SHOULDER WITHOUT AND WITH CONTRAST
TECHNIQUE: Multiplanar, multisequence MR imaging of the shoulder was performed
before and after contrast administration.
CONTRAST:  17 cc MultiHance

[Series 2: T2 fat-sat · axial · 4.0mm · 0.35mm/px · z∈[-17,+79]mm · 3 of 23 slices shown (1 of 3)]
[im 1/23]
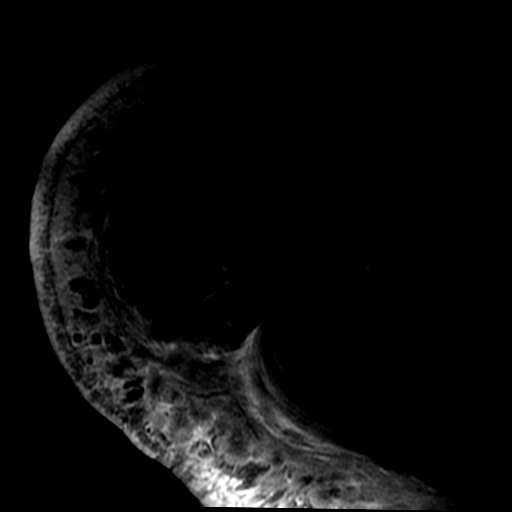
[im 12/23]
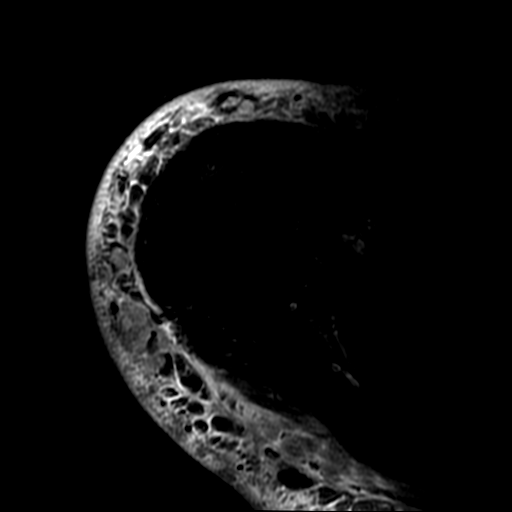
[im 23/23]
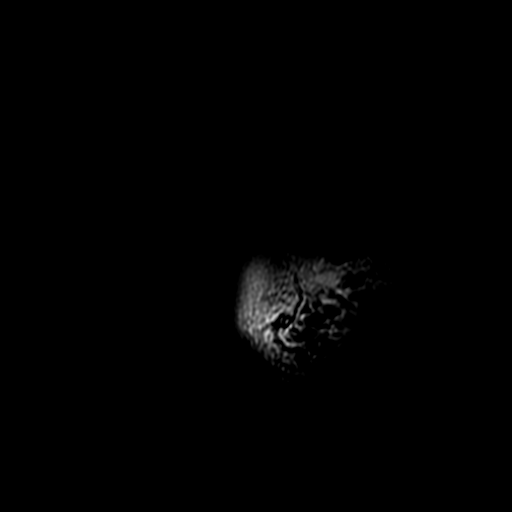

[Series 3: T1 fat-sat · axial · 4.0mm · 0.35mm/px · z∈[-17,+79]mm · 3 of 23 slices shown]
[im 1/23]
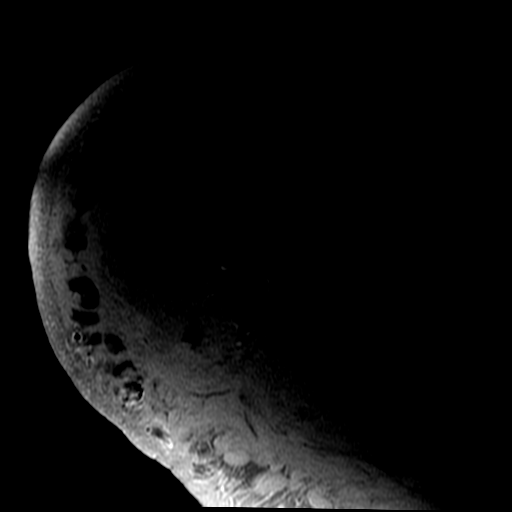
[im 12/23]
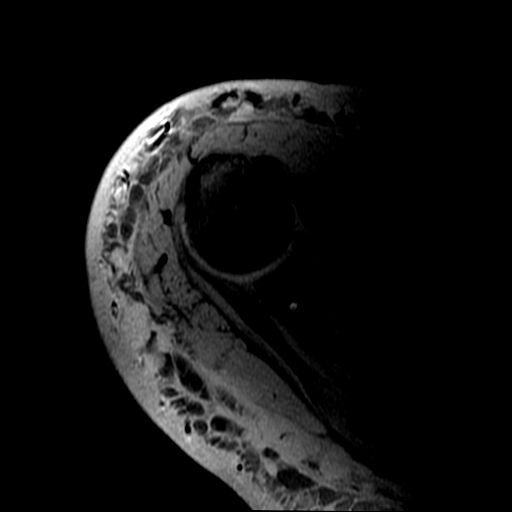
[im 23/23]
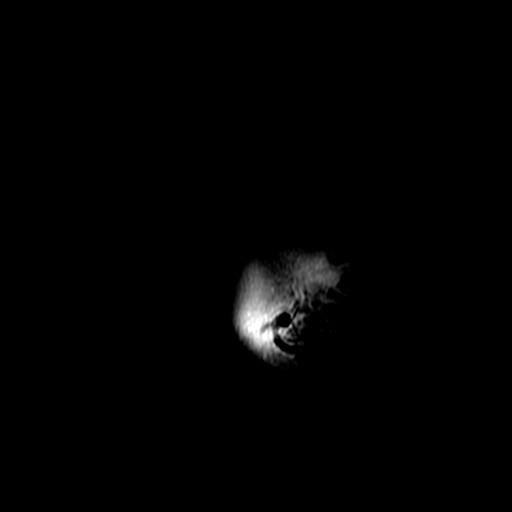

[Series 4: T1 · oblique · 4.0mm · 0.31mm/px · 2 of 35 slices shown]
[im 1/35]
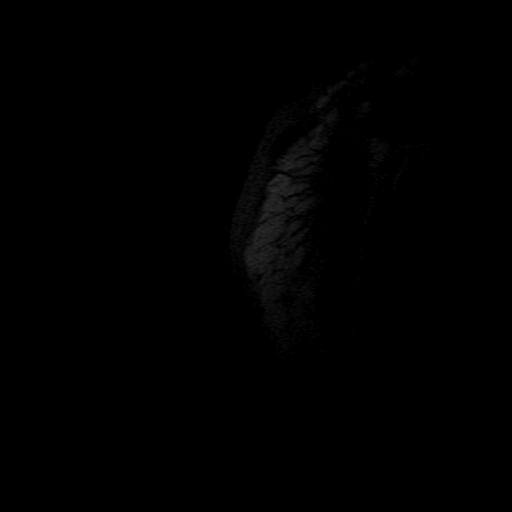
[im 9/35]
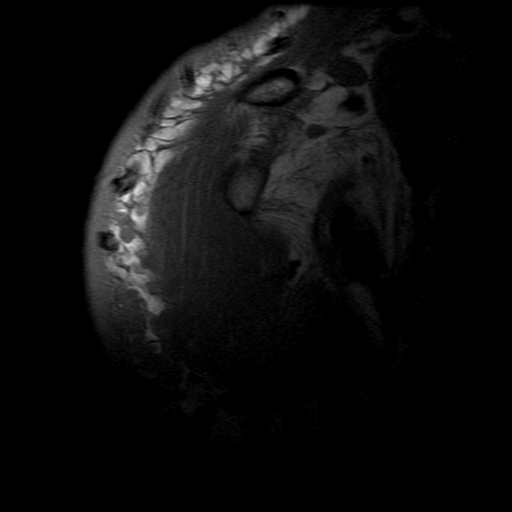

[Series 7: T2 fat-sat · coronal · 4.0mm · 0.33mm/px · 4 of 23 slices shown (2 of 3)]
[im 1/23]
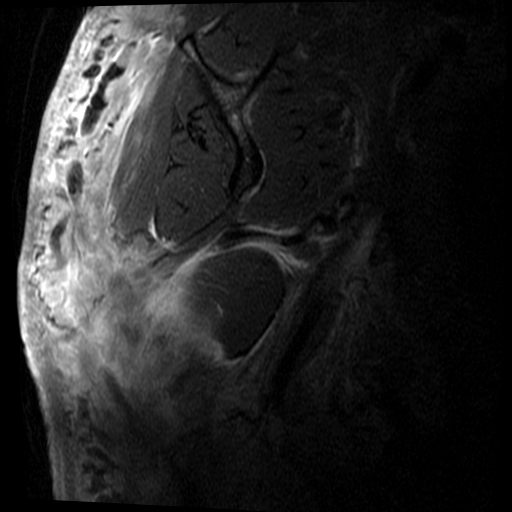
[im 8/23]
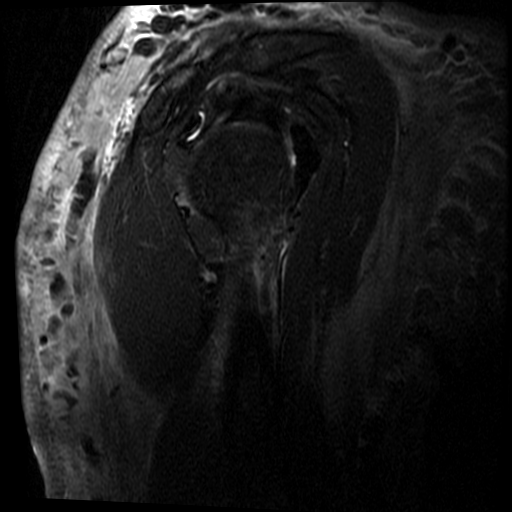
[im 15/23]
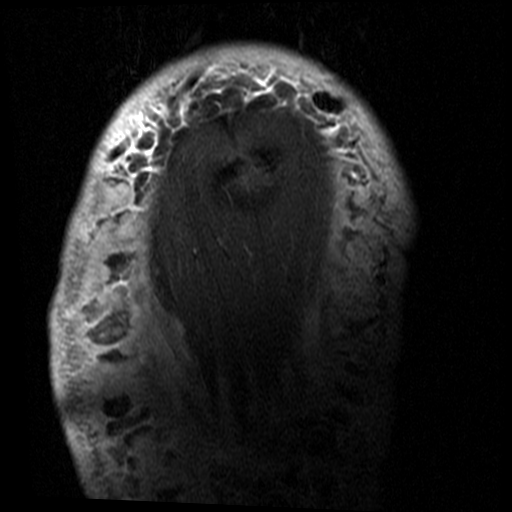
[im 23/23]
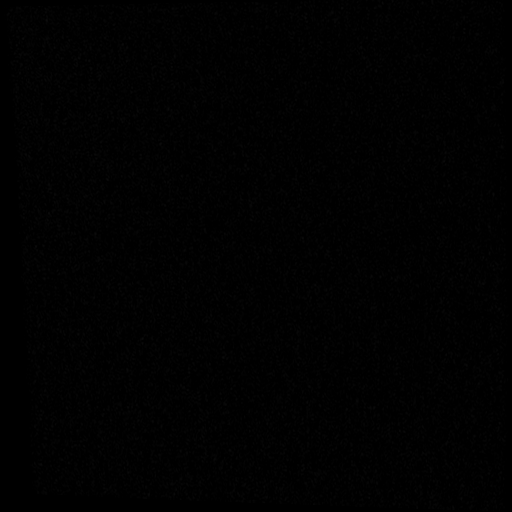

[Series 8: T2 fat-sat · oblique · 4.0mm · 0.31mm/px · 6 of 35 slices shown (3 of 3)]
[im 1/35]
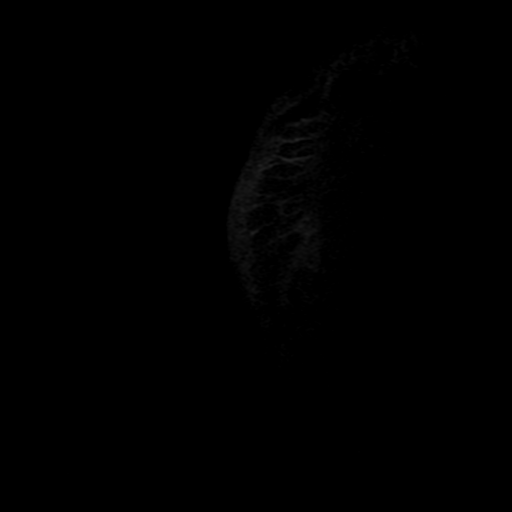
[im 7/35]
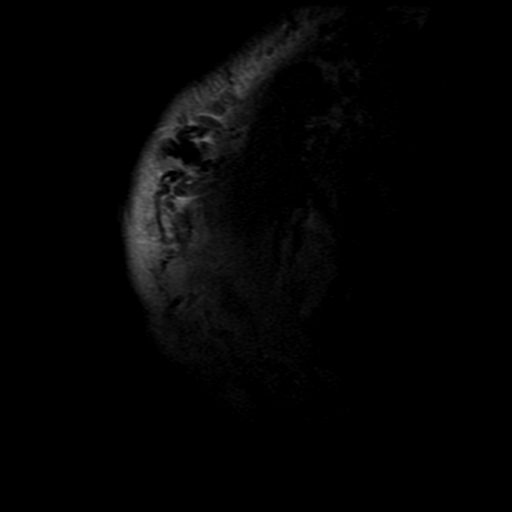
[im 14/35]
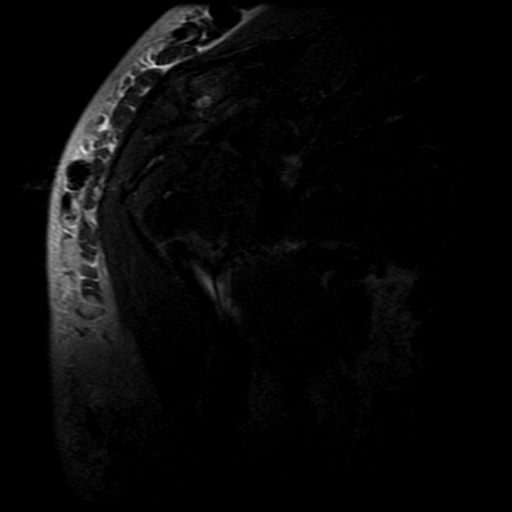
[im 21/35]
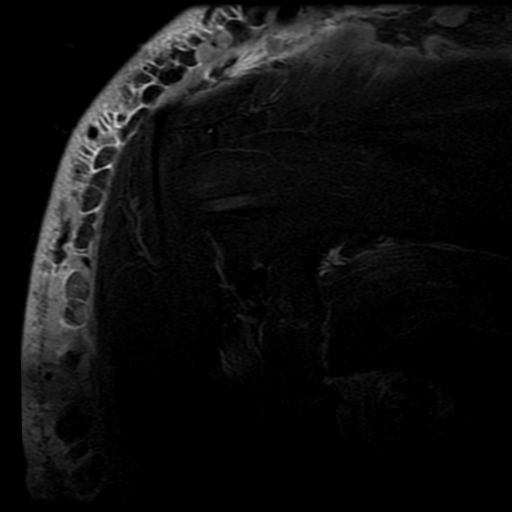
[im 28/35]
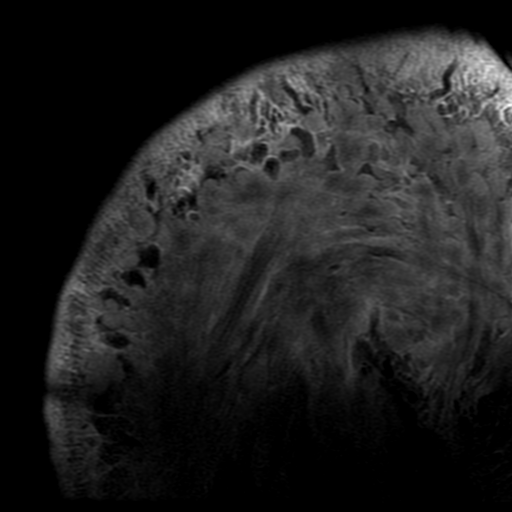
[im 35/35]
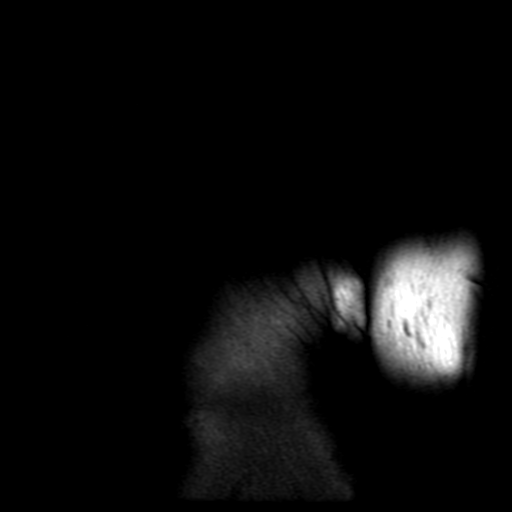

[18 of 40 positions shown; findings below may reference images not displayed]

FINDINGS: Soft tissues: There is extensive soft tissue edema in around the
shoulder joint including areas of what appear to be focal edema in
some of the muscles. There is thickening of the scan. There are
numerous superficial varicose veins in the subcutaneous fat around
the shoulder, probably due to venous occlusion in the arm. The
patency of the vessels is not well assessed on this MRI.

Rotator cuff: There is a small intrasubstance tear of the
infraspinatus at the musculotendinous junction. The rotator cuff is
otherwise intact.

Muscles: No atrophy. There are multiple areas of focal high signal
on T2 with and T1 weighted imaging in the muscles around the
shoulder including the proximal triceps and in the proximal short
head of the biceps. There is no appreciable enhancement of these
areas after contrast administration.

Biceps long head:  Properly located and intact.

Acromioclavicular Joint: Type 3 acromion with inferior osteophyte on
the distal clavicle which could predispose to impingement. Small
subcortical cyst in the distal clavicle. There is enhancement in and
around the AC joint after contrast administration. There is also
slight enhancement in the adjacent subacromial bursa but there is no
fluid in the bursa.

Glenohumeral Joint: Normal.

Labrum:  Normal.

Bones: Extra-articular bones are normal. No visible metastatic
disease in the bones.
IMPRESSION: 1. AC joint arthropathy with inflammation in and around the joint
which could be painful.
2. Small musculotendinous intrasubstance tear of the infraspinatus.
3. Extensive subcutaneous edema with multifocal muscle edema and
numerous varicose superficial veins around the shoulder which I
suspect is due to venous occlusion. The focal areas of muscle edema
or unusual but there is no appreciable enhancement after contrast
administration and therefore I do not think these represent tumor.
4. CT venography of the right shoulder, upper arm and upper chest
may be useful for further delineation of the venous structures in
the upper arm and right axilla and for further evaluation of the
areas of abnormal signal in the muscles including the triceps and
proximal biceps if you feel this is clinically necessary.

## 2015-10-08 DIAGNOSIS — C50911 Malignant neoplasm of unspecified site of right female breast: Secondary | ICD-10-CM | POA: Diagnosis not present

## 2015-10-09 ENCOUNTER — Other Ambulatory Visit: Payer: Self-pay | Admitting: Oncology

## 2015-10-09 NOTE — Progress Notes (Unsigned)
I heard from physical therapy that Margarette was back and that her right arm was considerably better. I was just delighted. I gave her a call today to tell her how pleased we were and I asked her to drop in some time so we can take a look at it and try to figure out exactly what they did in [place] that was so helpful to her.

## 2015-10-19 ENCOUNTER — Telehealth: Payer: Self-pay | Admitting: Oncology

## 2015-10-19 ENCOUNTER — Other Ambulatory Visit: Payer: Self-pay | Admitting: Oncology

## 2015-10-19 ENCOUNTER — Telehealth: Payer: Self-pay | Admitting: *Deleted

## 2015-10-19 DIAGNOSIS — C50012 Malignant neoplasm of nipple and areola, left female breast: Secondary | ICD-10-CM

## 2015-10-19 NOTE — Telephone Encounter (Signed)
Patient called requesting collaborative nurse.  "I spoke with Dr. Jana Hakim and was told to come in for him to take a look at me.  I just returned [place] [facility] from [place]."   Verbal order received and read back for patient to come in anytime and he'll be glad to look at her arm.  With this call she "request refill on oxycodone but would like 20 mg tablets instead of taking two 10 mg tablets.  I left these in my husbands car who works in the road.  He is in [place] on his way to [place].  I have horrible pain under my arm at night."

## 2015-10-19 NOTE — Telephone Encounter (Signed)
Appointments made and avs printed for pt,echo to [facility] for precert

## 2015-10-20 ENCOUNTER — Other Ambulatory Visit: Payer: Self-pay | Admitting: Oncology

## 2015-10-20 DIAGNOSIS — C50311 Malignant neoplasm of lower-inner quadrant of right female breast: Secondary | ICD-10-CM

## 2015-10-23 ENCOUNTER — Telehealth: Payer: Self-pay | Admitting: Nurse Practitioner

## 2015-10-23 NOTE — Telephone Encounter (Signed)
This RN contacted Agency in [place] at [phone] to request treatment plan and MD dictation per scheduled visit 11/14/2015.  Per automated VM- fax requested for records to [phone].  Request for records faxed per above.

## 2015-10-23 NOTE — Telephone Encounter (Signed)
Called and left a message with 12/19 echo

## 2015-10-26 ENCOUNTER — Inpatient Hospital Stay (HOSPITAL_COMMUNITY): Admission: RE | Admit: 2015-10-26 | Payer: Commercial Managed Care - PPO | Source: Ambulatory Visit

## 2015-10-30 ENCOUNTER — Other Ambulatory Visit: Payer: Self-pay

## 2015-10-30 DIAGNOSIS — C50211 Malignant neoplasm of upper-inner quadrant of right female breast: Secondary | ICD-10-CM

## 2015-10-30 MED ORDER — OXYMORPHONE HCL ER 20 MG PO T12A: 20.0000 mg | EXTENDED_RELEASE_TABLET | Freq: Two times a day (BID) | ORAL | Status: DC | PRN

## 2015-11-04 ENCOUNTER — Other Ambulatory Visit: Payer: Self-pay | Admitting: Nurse Practitioner

## 2015-11-06 ENCOUNTER — Telehealth: Payer: Self-pay | Admitting: *Deleted

## 2015-11-06 MED ORDER — DM-GUAIFENESIN ER 30-600 MG PO TB12: 1.0000 | ORAL_TABLET | Freq: Two times a day (BID) | ORAL | Status: DC

## 2015-11-06 NOTE — Telephone Encounter (Signed)
This RN obtained faxed note from [facility] showing pt called after hours 12/28 with cough.  No fevers.  Per call today- [name] states she is doing better and has no current needs at this time.'  Pt wishes everyone here a " Happy New Year ".  This RN informed pt the same as well as to call us if local care needed.

## 2015-11-08 DIAGNOSIS — C50911 Malignant neoplasm of unspecified site of right female breast: Secondary | ICD-10-CM | POA: Diagnosis not present

## 2015-11-10 ENCOUNTER — Other Ambulatory Visit: Payer: Self-pay | Admitting: *Deleted

## 2015-11-10 ENCOUNTER — Telehealth: Payer: Self-pay | Admitting: *Deleted

## 2015-11-10 NOTE — Telephone Encounter (Signed)
Pt called to this RN today due to concerns regarding cough medication.  " It is bringing stuff up but then I saw a little blood and it scared me "  This RN discussed use of muccinex DM and how it works relating to breaking up phlegm and therefore making the cough more productive for her benefit.  Per inquiry noted blood was stated " like a streak "  [name] denies any fevers or clots of blood.  Per discussion [name] understands to help medication work best she needs to drink lots of fluid.  No other needs at this time and she understands to call if concerns occur.

## 2015-11-13 ENCOUNTER — Ambulatory Visit: Payer: Commercial Managed Care - PPO

## 2015-11-13 ENCOUNTER — Other Ambulatory Visit: Payer: Commercial Managed Care - PPO

## 2015-11-13 ENCOUNTER — Other Ambulatory Visit: Payer: Self-pay | Admitting: *Deleted

## 2015-11-13 ENCOUNTER — Ambulatory Visit: Payer: Commercial Managed Care - PPO | Admitting: Nurse Practitioner

## 2015-11-13 ENCOUNTER — Telehealth: Payer: Self-pay | Admitting: *Deleted

## 2015-11-13 ENCOUNTER — Other Ambulatory Visit: Payer: Self-pay | Admitting: Nurse Practitioner

## 2015-11-13 DIAGNOSIS — C50211 Malignant neoplasm of upper-inner quadrant of right female breast: Secondary | ICD-10-CM

## 2015-11-13 MED ORDER — OXYCODONE HCL 20 MG PO TABS: 1.0000 | ORAL_TABLET | Freq: Four times a day (QID) | ORAL | Status: DC

## 2015-11-13 NOTE — Telephone Encounter (Signed)
Called pt earlier to inquire about missed appt for today with NP. Pt told me she is still trying to get over the cold symptoms she acquired before Christmas. Pt has no fever, temp is 97.1 as we were talking on phone. She is currently taking Mucinex and cough is productive. She has some mucous draining as well, color yellowish-green.I encouraged pt to increase water intake as she is taking the Mucinex. Pt said she has been drinking well and making protein smoothies also. We refilled her pain medication today. Daughter will come to pick up script. I will call pt on Monday to f/u to see how she is doing and get this appt r/s. Message to be fwd to H Boelter,NP.

## 2015-11-13 NOTE — Telephone Encounter (Signed)
Chart already opened - no entry

## 2015-11-16 ENCOUNTER — Telehealth: Payer: Self-pay | Admitting: *Deleted

## 2015-11-16 NOTE — Telephone Encounter (Signed)
Pt returned this nurse call from earlier. I had called pt this am to f/u to see how she was recovering from cold symptoms. Pt denies any fever, highest temp was 99.1 on Saturday. Pt still has a cough expelling thick, yellowish mucous. Pt completed 2nd box of Mucinex yesterday. Encouraged pt to keep drinking plenty of fluids to thin secretions. I have suggested pt takes OTC Delsym for the cough. Pt told me that she has not been bothered by any headaches since Friday. Pt verbalizes understanding. Pt will see Hospice doctor on Wed. Jan 18. I will continue to call to follow-up. Message to be fwd to [employer]

## 2015-11-17 ENCOUNTER — Telehealth: Payer: Self-pay | Admitting: *Deleted

## 2015-11-17 ENCOUNTER — Other Ambulatory Visit: Payer: Self-pay | Admitting: *Deleted

## 2015-11-17 MED ORDER — AZITHROMYCIN 250 MG PO TABS: ORAL_TABLET | ORAL | Status: DC

## 2015-11-17 MED ORDER — HYDROCODONE-HOMATROPINE 5-1.5 MG/5ML PO SYRP: 5.0000 mL | ORAL_SOLUTION | Freq: Four times a day (QID) | ORAL | Status: DC | PRN

## 2015-11-17 NOTE — Telephone Encounter (Signed)
Called to inform pt that a Rx -[place] is waiting for her to pick-up at [facility]. I also sent Rx for a Z-pak to her [facility]. No answer but left detailed message to VM.

## 2015-11-23 ENCOUNTER — Telehealth: Payer: Self-pay | Admitting: *Deleted

## 2015-11-23 NOTE — Telephone Encounter (Signed)
Called Dr. [name] and spoke with him directly. Pt was trying to get pain medication from Dr. [name] today. I told him pt's Rx has already been refilled on 11/13/15 by this office.Dr. [name] thinks the patient has been using different sources to get her pain med. He said he will not prescribe her any pain medicine since it was already taken care of for this month. We both agreed this is best so there will be no abuse of pain medication. Dr. [name] says he'll only need letter if we decide to stop prescribing her medication so she'll only have one source of receiving pain medication.

## 2015-11-23 NOTE — Telephone Encounter (Addendum)
[name] called asking for a letter today to take to Dr. [name].  "Would like to pick up a letter today that states you all will no longer give me medication.  I do not want to receive anymore chemotherapy.  Dr. [name] does not want me to double dip in any medications.  Until last week I have spent six months under Hospice."  Advised about missed appointments asking to schedule F/U.  "I will call as/when needed to schedule a F/U.  Just need letter that reads you will not prescribe medication for me."    Advised we will call when ready for pick up.  Return number [phone].

## 2015-11-24 ENCOUNTER — Telehealth: Payer: Self-pay | Admitting: *Deleted

## 2015-11-24 NOTE — Telephone Encounter (Signed)
"  I need to speak with Dr. [name] nurse or someone who knows Dr. Jana Hakim."    Call transferred to [phone].  Voice mail received.

## 2015-11-24 NOTE — Telephone Encounter (Signed)
Patient called again today stating that it "was an emergency" and she needed a return call.  Patient stated that she had a sore throat and needed to speak with someone.  Writer returned her call and LVM on one of two numbers that were provided and on the second number provided which is the same two numbers in her chart a man answered and stated that their was not a person by patient's name connected to the number.

## 2015-11-25 ENCOUNTER — Telehealth: Payer: Self-pay

## 2015-11-25 ENCOUNTER — Other Ambulatory Visit: Payer: Self-pay | Admitting: Oncology

## 2015-11-25 ENCOUNTER — Encounter: Payer: Self-pay | Admitting: Oncology

## 2015-11-25 NOTE — Telephone Encounter (Signed)
Patient called today and would like to be followed by Dr. Jana Hakim for her cancer however states that she would like a letter stating that Dr. Jana Hakim will not be prescribing any narcotics.  Patient would like her PCP Dr. [name] to manage her pain.  Patient also states that she will not be taking any chemotherapy but would like to have her port flushed every six weeks at the cancer center, she had it flushed two weeks ago.  So patient is looking for a letter to take to Dr. [name] and a appt scheduled with Dr. Jana Hakim as well as a port flush appt.

## 2015-11-25 NOTE — Telephone Encounter (Signed)
Patient called back to let Dr. Jana Hakim know that [facility] will be faxing patient's records here to our [facility].  Writer provided her with the fax number.

## 2015-11-25 NOTE — Telephone Encounter (Signed)
Letter in your box-- see note  Thanks!

## 2015-11-25 NOTE — Telephone Encounter (Signed)
Called patient to let her know that Dr. Jana Hakim wrote a letter for Dr. [name] regarding her pain control and that he would not  be managing it, Dr. [name] would be- writer was mailing the letter to the patient per her request.  Writer also asked patient to give Dr. Jana Hakim clearance to access her medical records from [place].  Patient stated she would call them  today and give her permission to release her records to Dr. Jana Hakim.  Dr. Jana Hakim placed a POF for an appt with him and a port flush appt. Patient stated understanding.

## 2015-11-26 ENCOUNTER — Telehealth: Payer: Self-pay | Admitting: Oncology

## 2015-11-26 NOTE — Telephone Encounter (Signed)
Appointments made and patient is aware  anne °

## 2015-12-09 DIAGNOSIS — C50911 Malignant neoplasm of unspecified site of right female breast: Secondary | ICD-10-CM | POA: Diagnosis not present

## 2015-12-24 ENCOUNTER — Other Ambulatory Visit: Payer: Self-pay | Admitting: Oncology

## 2015-12-25 ENCOUNTER — Other Ambulatory Visit: Payer: Commercial Managed Care - PPO

## 2016-01-01 ENCOUNTER — Ambulatory Visit: Payer: Commercial Managed Care - PPO | Admitting: Oncology

## 2016-01-03 NOTE — Progress Notes (Signed)
no show

## 2016-01-06 DIAGNOSIS — C50911 Malignant neoplasm of unspecified site of right female breast: Secondary | ICD-10-CM | POA: Diagnosis not present

## 2016-01-22 DIAGNOSIS — C50911 Malignant neoplasm of unspecified site of right female breast: Secondary | ICD-10-CM | POA: Diagnosis not present

## 2016-01-25 ENCOUNTER — Ambulatory Visit
Admission: RE | Admit: 2016-01-25 | Discharge: 2016-01-25 | Disposition: A | Discharge status: Home / Self Care | Payer: Commercial Managed Care - PPO | Source: Ambulatory Visit | Attending: Radiation Oncology | Admitting: Radiation Oncology

## 2016-01-25 ENCOUNTER — Encounter: Payer: Self-pay | Admitting: Radiation Oncology

## 2016-01-25 ENCOUNTER — Ambulatory Visit: Payer: Commercial Managed Care - PPO

## 2016-01-25 ENCOUNTER — Telehealth: Payer: Self-pay | Admitting: Oncology

## 2016-01-25 HISTORY — DX: Reserved for concepts with insufficient information to code with codable children: IMO0002

## 2016-01-25 HISTORY — DX: Reserved for inherently not codable concepts without codable children: IMO0001

## 2016-01-25 NOTE — Telephone Encounter (Signed)
[name] regarding her appointment with Dr. Sondra Come today.  [facility] said she did not think she need to go because she has had radiation before.  Advised her that the appointment was to see if she could have more radiation to the area. Transferred her to Suanne Marker, Art therapist to reschedule.

## 2016-02-06 DIAGNOSIS — C50911 Malignant neoplasm of unspecified site of right female breast: Secondary | ICD-10-CM | POA: Diagnosis not present

## 2016-02-10 ENCOUNTER — Ambulatory Visit
Admission: RE | Admit: 2016-02-10 | Discharge: 2016-02-10 | Disposition: A | Discharge status: Home / Self Care | Payer: Commercial Managed Care - PPO | Source: Ambulatory Visit | Attending: Radiation Oncology | Admitting: Radiation Oncology

## 2016-02-10 ENCOUNTER — Telehealth: Payer: Self-pay | Admitting: Oncology

## 2016-02-10 ENCOUNTER — Ambulatory Visit: Payer: Commercial Managed Care - PPO

## 2016-02-10 NOTE — Telephone Encounter (Signed)
[place] regarding her appointment today.  She said she forgot about the appointment and would like to reschedule.  Transferred her to [place], Art therapist to reschedule.

## 2016-03-04 ENCOUNTER — Telehealth: Payer: Self-pay

## 2016-03-04 NOTE — Telephone Encounter (Signed)
Patient calling looking for "a few oxycodone" until she see's her regular MD next week.  Called transferred to MD's nurse- patient hung up the phone before it was connected.

## 2016-03-07 DIAGNOSIS — C50911 Malignant neoplasm of unspecified site of right female breast: Secondary | ICD-10-CM | POA: Diagnosis not present

## 2016-03-09 ENCOUNTER — Ambulatory Visit: Admission: RE | Admit: 2016-03-09 | Payer: Commercial Managed Care - PPO | Source: Ambulatory Visit

## 2016-03-09 ENCOUNTER — Ambulatory Visit
Admission: RE | Admit: 2016-03-09 | Discharge: 2016-03-09 | Disposition: A | Discharge status: Home / Self Care | Payer: Commercial Managed Care - PPO | Source: Ambulatory Visit | Admitting: Radiation Oncology

## 2016-03-09 NOTE — Progress Notes (Signed)
No show

## 2016-04-07 DIAGNOSIS — C50911 Malignant neoplasm of unspecified site of right female breast: Secondary | ICD-10-CM | POA: Diagnosis not present

## 2016-05-25 ENCOUNTER — Telehealth: Payer: Self-pay | Admitting: Oncology

## 2016-05-25 NOTE — Telephone Encounter (Signed)
lvm to inform pt of 7/24 appt date/times pof per orders 7/18

## 2016-05-29 NOTE — Progress Notes (Signed)
[place]  [phone] [phone]  OFFICE PROGRESS NOTE   ID: [name]   DOB: 1956-04-05  MR#: [id number]  EKC#:[id number]   PCP: [name] Hey, MD SU: [name], [name]/[name], [name]/[name] Hoxworth MD RAD TAV:[id number] [name], [name] OTHER:  [name] Bickers, MD, [name] Fudge MD  CHIEF COMPLAINT:  Stage IV Breast Cancer, angiosarcoma  CURRENT TREATMENT: Abraxane, trastuzumab, pertuzumab  BREAST CANCER HISTORY: From the original intake note:  The patient originally presented in May 2003 when she noticed a lump in the upper inner quadrant of her right breast in September 2003.  She sought attention and had a mammogram which showed an obvious carcinoma in the upper inner quadrant of the right breast, approximately 2 cm.  There were some enlarged lymph nodes in the axilla and an FNA done showed those consistent with malignant cells, most likely an invasive ductal carcinoma.  At that point she was unsure of what to do and was referred by Dr. Marylene [name] for a discussion of her treatment options.  By biopsy, it was ER/PR negative and HER-2 was 3+.  DNA index was 1.42.  We reiterated that it was most important to have her disease surgically addressed at that point and had recommended lumpectomy and axillary nodes to be addressed with which she followed through and on 04-03-02, [name] performed a right partial mastectomy and a right axillary lymph node dissection.  Final pathology revealed a 2.4 cm. high grade, Grade III invasive ductal carcinoma with an adjacent .8 cm. also high grade invasive ductal carcinoma which was felt to represent an intramammary metastasis rather than a second primary.  A smaller mass was just medial to the larger mass.  The smaller mass was associated with high grade DCIS component.  There was no definite lymphovascular invasion identified.  However, one of fourteen axillary lymph nodes did contain a 1.5 cm. metastatic  deposit.  Postoperatively she did very well.  We reiterated the fact that she did need adjuvant chemotherapy, however, she refused and decided that she would pursue radiation.  She received radiation and completed that on 08-14-02. Her subsequent history is as detailed below.   INTERVAL HISTORY:   [name] was last seen here for an official visit 05/15/2015 when her right upper extremity lesions were first recognized since angiosarcoma. She had since some no show appointments here. She was treated at [facility] in [place] with one dose of Taxol as well as Herceptin and progesterone. She tolerated that poorly and was referred to hospice. More recently she called to let us know she felt that sufficiently improved (she's gained her weight back) that she can consider trying chemotherapy again.  She is seen today accompanied by her daughter [name]  REVIEW OF SYSTEMS: [name]'s right upper extremity continues to be chronically swollen, turgid, and also radiated. She is going to the wound clinic and that is helping. She cannot receive physical therapy at this time given the ulcerative lesions. Her pain is controlled with oxycodone and oxymorphone. She denies constipation. She feels dizzy sometimes and sometimes has blurred vision. She denies nausea or vomiting. She can be short of breath but does not know this is due to the heat or to problems with her lungs. A detailed review of systems today was otherwise stable  PAST MEDICAL HISTORY: Past Medical History:  Diagnosis Date  . Breast cancer ([facility])   . DVT (deep venous thrombosis) ([facility]) 10/07/2011  . Hypertension   . Radiation 11/29/2005-12/29/2005  right supraclavicular area 4147 cGy  . Radiation 06/26/02-08/14/02   right breast 5040 cGy, tumor bed boosted to 1260 cGy  . Rheumatic fever     PAST SURGICAL HISTORY: Past Surgical History:  Procedure Laterality Date  . BREAST SURGERY    . MASTECTOMY     bilateral  . PORT A CATH  REVISION      FAMILY HISTORY Family History  Problem Relation Age of Onset  . Pancreatic cancer Mother   . Kidney cancer Sister 31  . Breast cancer Sister 67  . Breast cancer Other 100  The patient's father died in his 99J  from complications of alcohol abuse. The patient's mother died in her 68s from pancreatic cancer. The patient has 6 brothers, 2 sisters. Both her sisters have had breast cancer, both diagnosed after the age of 56. The patient has not had genetic testing so far.   GYNECOLOGIC HISTORY: Menarche age 36; first live birth age 70. She is GXP4. She is not sure when she stopped having periods. She never used hormone replacement.  SOCIAL HISTORY: The patient has worked in the past as a Psychologist, counselling. At home in addition to the patient are her husband [name], originally from [place], who works as a Administrator.  Also living in the home is her son [name] and her daughter [name] (she is studying to be a Marine scientist).  The other 2 children are [name], who has a college degree in psychology and works as a Probation officer; and [name], who lives in [place] and works as a Higher education careers adviser.   ADVANCED DIRECTIVES: Not in place  HEALTH MAINTENANCE: Social History  Substance Use Topics  . Smoking status: Light Tobacco Smoker  . Smokeless tobacco: Never Used  . Alcohol use Yes     Comment: Rarely     Colonoscopy:  Not on file  PAP:  Not on file  Bone density:  Not on file  Lipid panel:  Not on file    Allergies  Allergen Reactions  . Tramadol Nausea Only  . Hydrocodone-Acetaminophen Itching and Rash    Current Outpatient Prescriptions  Medication Sig Dispense Refill  . alprazolam (XANAX) 2 MG tablet Take 2 mg by mouth daily.     . Ascorbic Acid (VITAMIN C) 100 MG tablet Take 100 mg by mouth daily.      . calcium-vitamin D (OSCAL WITH D) 500-200 MG-UNIT per tablet Take 1 tablet by mouth daily.      Marland Kitchen dextromethorphan-guaiFENesin (MUCINEX DM) 30-600 MG 12hr tablet Take 1 tablet by  mouth 2 (two) times daily. 14 tablet 0  . gabapentin (NEURONTIN) 300 MG capsule Take 1 capsule (300 mg total) by mouth at bedtime. 90 capsule 4  . HYDROcodone-homatropine (HYCODAN) 5-1.5 MG/5ML syrup Take 5 mLs by mouth every 6 (six) hours as needed for cough. 120 mL 0  . Multiple Vitamin (MULTIVITAMIN) capsule Take 1 capsule by mouth daily.      . Oxycodone HCl 20 MG TABS Take 1 tablet (20 mg total) by mouth every 6 (six) hours. 120 tablet 0  . Oxymorphone HCl, Crush Resist, 20 MG T12A Take 20 mg by mouth 2 (two) times daily as needed. 60 tablet 0   No current facility-administered medications for this visit.    Facility-Administered Medications Ordered in Other Visits  Medication Dose Route Frequency Provider Last Rate Last Dose  . sodium chloride 0.9 % injection 10 mL  10 mL Intravenous PRN Chauncey Cruel, MD   10 mL at 03/04/14 1421  OBJECTIVE: Middle-aged African American woman   Vitals:   05/30/16 1416  BP: 123/62  Pulse: 83  Resp: 18  Temp: 98.9 F (37.2 C)     Body mass index is 29.65 kg/m.    ECOG FS: 2 Filed Weights   05/30/16 1416  Weight: 178 lb 3.2 oz (80.8 kg)    Sclerae unicteric, EOMs intact Oropharynx clear, full upper plate Bilateral cervical adenopathy, right greater than left. Lungs no rales or rhonchi Heart regular rate and rhythm Abd soft, nontender, positive bowel sounds MSK chronic right upper extremity lymphedema, with the upper arm ulcerated area bandaged over Neuro: nonfocal, well oriented, positive affect Breasts: Deferred   photo right upper extremity 04/20/2015    LAB RESULTS:.  Lab Results  Component Value Date   WBC 7.9 05/30/2016   NEUTROABS 5.2 05/30/2016   HGB 10.5 (L) 05/30/2016   HCT 33.0 (L) 05/30/2016   MCV 84.8 05/30/2016   PLT 266 05/30/2016      Chemistry      Component Value Date/Time   NA 140 05/30/2016 1336   K 3.9 05/30/2016 1336   CL 100 08/14/2014 0100   CL 101 03/07/2013 1411   CO2 31 (H) 05/30/2016  1336   BUN 12.6 05/30/2016 1336   CREATININE 0.8 05/30/2016 1336      Component Value Date/Time   CALCIUM 8.8 05/30/2016 1336   ALKPHOS 96 05/30/2016 1336   AST 28 05/30/2016 1336   ALT 12 05/30/2016 1336   BILITOT <0.30 05/30/2016 1336       STUDIES: No results found. Patient: JONTAE, SONIER Collected: 04/23/2015 Client: [facility] Accession: [id number] Received: 04/23/2015 Darene Lamer. Hoxworth, MD DOB: 1956/08/30 Age: 23 Gender: F Reported: 05/06/2015 [address] [place] Patient Ph: [phone] MRN #: [id number] [place], [place] [address] Client Acc#: Chart #: [id number] Phone: Fax: CC: Lurline Del, MD REPORT OF DERMATOPATHOLOGY FINAL DIAGNOSIS and MICROSCOPIC DESCRIPTION Diagnosis Skin , right upper arm AMENDED DIAGNOSIS: ATYPICAL VASCULAR PROLIFERATION, SEE DESCRIPTION. Microscopic Description The biopsy was re-reviewed based on additional clinical information provided, and immunohistochemical stains have been performed. This is a small punch biopsy with significant crush artifact, limiting the evaluation of the cytomorphology and the overall histologic details. There is a proliferation of spindle cells diffusely present in the dermis in a background of thickened collagen bundles with some clefting. These cells have hyperchromatic nuclei and abundant amphophilic cytoplasm. Some mitotic figures are noted. There is edema in the papillary dermis. The epidermis shows reactive changes and focal ulceration. In an attempt to determine the lineage of these cells, immunohistochemical stains are performed revealing the cells to be positive for the vascular marker CD31 and negative for cytokeratin AE1/AE3, S100, CD68 and Factor XIIIa. The changes are those of a vascular proliferation with atypical features suspicious for angiosarcoma. A larger biopsy is recommended to help further evaluate this process and confirm the diagnosis. This case was discussed with  [name] on 05/05/2015. The slides have also been reviewed by Drs. Kathee Delton, who are in agreement with the diagnosis. [name] Fast MD Dermatopathologist, Electronic Signature (Case signed 05/06/2015)   ASSESSMENT:  60 y.o. [place] woman with stage IV breast cancer manifested chiefly by loco-regional nodal disease (neck, chest), without lung, liver, bone, or brain involvement.  (1) Status post right lumpectomy and sentinel lymph node sampling 03/04/2002 for 2 separate foci of invasive ductal carcinoma, mpT2 pN1 or stage IIB, both foci grade 3, both estrogen and progesterone receptor positive, both  HercepTest 3+, Mib-1 56%  (2) Reexcision for margins 05/27/2002 showed no residual cancer in the breast.  (3) The patient refused adjuvant systemic therapy.  (4) Adjuvant radiation treatment completed 08/14/2002.  (5) recurrence in the right breast in 02/2004 showing a morphologically different tumor, again grade 3, again estrogen and progesterone receptor negative, with an MIB-1 of 14% and Herceptest 3+.  (5) Between 03/2004 and 07/2004 she received dose dense Doxorubicin/Cyclophosphamide x 4 given with trastuzumab, followed by weekly Docetaxel x 8, again given with trastuzumab.  (6) Right mastectomy 07/13/2004 showed scattered microscopic foci of residual disease over an area  greater than 5 cm. Margins were negative.  (7) Postoperative Docetaxel continued until 09/2004.  (8) Trastuzumab (Herceptin) given 08/2004 through 01/2012 with some brief interruptions.  (9) Isolated right cervical nodal recurrence 10/2005, treated with radiation to the right supraclavicular area (total 41.5 gray) completed 12/29/2005.  (10) Navelbine given together with Herceptin  11/2005 through 03/2006.  (9) Left mastectomy 02/13/2006 for ductal carcinoma in situ, grade 2, estrogen and progesterone receptor negative, with negative margins; 0 of 3 lymph nodes involved  (10) treated with Lapatinib and  Capecitabine before 10/2009, for an unclear duration and with unclear results (cannot locate data on chart review).  (11) Status post right supraclavicular lymph node biopsy 09/2010 again positive for an invasive ductal carcinoma, estrogen and progesterone receptor negative, HER-2 positive by CISH with a ratio 4.25.  (12) Navelbine given together with Herceptin between 05/2011 and 11/2011.  (13) Carboplatin/ Gemcitabine/ Herceptin given for 2 cycles, in 12/2011 and 01/2012.  (14) TDM-1 ([place]) started 02/2012. Last dose 10/02/2013 after which the patient discontinued treatment at her own discretion. Echo on December 2014 showed a well preserved ejection fraction.  (15) Deep vein thrombosis of the right upper extremity documented 04/20/2011.  She completed anticoagulant therapy with Coumadin on 03/25/2013.  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 Left chest Port-A-Cath placed on 04/08/2013; being flushed every 6 weeks  RIGHT UPPER EXTREMITY ANGIOSARCOMA VS BREAST CANCER: (18) treated at [facility] August 2016 with paclitaxel, trastuzumab and pertuzumab 1 cycle, afterwards referred to hospice  (19) under the care of hospice of [facility] August 2016 05/08/2016, when the patient opted for a second try at chemotherapy  (20) To start low-dose Abraxane, trastuzumab and pertuzumab 06/07/2016, repeated every 3 weeks.   (a) pretreatment echocardiogram pending   PLAN: I spent approximately 45-50 minutes with [name] and her daughter today going over [place] situation.  Lauretta has been under hospice care for several months and is very appreciative of the support she has received. In fact her functional status has improved sufficiently  that she is considering chemotherapy again.  I think this is reasonable. She has significant problems with the right upper extremity and this is only going to get worse unless she gets some treatment. Even from a palliative point of view I think treatment is indicated.  She only received 1 dose of Taxol with trastuzumab and pertuzumab because she tolerated the Taxol so poorly. We're going to give Abraxane a chance. And since she apparently tolerated the trastuzumab and pertuzumab well she will receive that at the same time. [facility] start date is August 1. She already has a port in place. She does not use EMLA cream  I'm going  to obtain a brain MRI, CT of the chest and bone scan before we start I'm also obtaining an echocardiogram.  She will see our APP 06/07/2016, since I will not be in that week. So long as the echo is adequate we can proceed to treatment even if some of the other tests have not been performed. However if the echo has not yet been done or if it is inadequate we will hold the anti-HER-2 treatment but proceed with Abraxane.  She will see me again in August 9 to make sure she tolerated treatment well. The overall plan at least initially is for 3 cycles of treatment followed by restaging studies  [place] has a good understanding of the possible toxicities side effects and complications of this treatment. She is agreeable to proceeding. She knows to call for any problems that may develop before her next visit.  Chauncey Cruel, MD  05/30/2016 8:00 PM

## 2016-05-30 ENCOUNTER — Ambulatory Visit (HOSPITAL_BASED_OUTPATIENT_CLINIC_OR_DEPARTMENT_OTHER): Payer: Commercial Managed Care - PPO

## 2016-05-30 ENCOUNTER — Telehealth: Payer: Self-pay | Admitting: *Deleted

## 2016-05-30 ENCOUNTER — Ambulatory Visit (HOSPITAL_BASED_OUTPATIENT_CLINIC_OR_DEPARTMENT_OTHER): Payer: Commercial Managed Care - PPO | Admitting: Oncology

## 2016-05-30 ENCOUNTER — Other Ambulatory Visit (HOSPITAL_BASED_OUTPATIENT_CLINIC_OR_DEPARTMENT_OTHER): Payer: Commercial Managed Care - PPO

## 2016-05-30 VITALS — BP 123/62 | HR 83 | Temp 98.9°F | Resp 18 | Ht 65.0 in | Wt 178.2 lb

## 2016-05-30 DIAGNOSIS — Z170 Estrogen receptor positive status [ER+]: Secondary | ICD-10-CM

## 2016-05-30 DIAGNOSIS — C778 Secondary and unspecified malignant neoplasm of lymph nodes of multiple regions: Secondary | ICD-10-CM

## 2016-05-30 DIAGNOSIS — Z95828 Presence of other vascular implants and grafts: Secondary | ICD-10-CM | POA: Insufficient documentation

## 2016-05-30 DIAGNOSIS — C50211 Malignant neoplasm of upper-inner quadrant of right female breast: Secondary | ICD-10-CM | POA: Diagnosis not present

## 2016-05-30 DIAGNOSIS — C4911 Malignant neoplasm of connective and soft tissue of right upper limb, including shoulder: Secondary | ICD-10-CM | POA: Diagnosis not present

## 2016-05-30 LAB — CBC WITH DIFFERENTIAL/PLATELET
BASO%: 0.4 % (ref 0.0–2.0)
Basophils Absolute: 0 10*3/uL (ref 0.0–0.1)
EOS%: 1.3 % (ref 0.0–7.0)
Eosinophils Absolute: 0.1 10*3/uL (ref 0.0–0.5)
HCT: 33 % — ABNORMAL LOW (ref 34.8–46.6)
HGB: 10.5 g/dL — ABNORMAL LOW (ref 11.6–15.9)
LYMPH%: 21.1 % (ref 14.0–49.7)
MCH: 27 pg (ref 25.1–34.0)
MCHC: 31.8 g/dL (ref 31.5–36.0)
MCV: 84.8 fL (ref 79.5–101.0)
MONO#: 0.9 10*3/uL (ref 0.1–0.9)
MONO%: 11.6 % (ref 0.0–14.0)
NEUT#: 5.2 10*3/uL (ref 1.5–6.5)
NEUT%: 65.6 % (ref 38.4–76.8)
Platelets: 266 10*3/uL (ref 145–400)
RBC: 3.89 10*6/uL (ref 3.70–5.45)
RDW: 13.8 % (ref 11.2–14.5)
WBC: 7.9 10*3/uL (ref 3.9–10.3)
lymph#: 1.7 10*3/uL (ref 0.9–3.3)

## 2016-05-30 LAB — COMPREHENSIVE METABOLIC PANEL
ALT: 12 U/L (ref 0–55)
AST: 28 U/L (ref 5–34)
Albumin: 2.8 g/dL — ABNORMAL LOW (ref 3.5–5.0)
Alkaline Phosphatase: 96 U/L (ref 40–150)
Anion Gap: 9 mEq/L (ref 3–11)
BUN: 12.6 mg/dL (ref 7.0–26.0)
CO2: 31 mEq/L — ABNORMAL HIGH (ref 22–29)
Calcium: 8.8 mg/dL (ref 8.4–10.4)
Chloride: 100 mEq/L (ref 98–109)
Creatinine: 0.8 mg/dL (ref 0.6–1.1)
EGFR: 88 mL/min/{1.73_m2} — ABNORMAL LOW (ref >90)
Glucose: 98 mg/dl (ref 70–140)
Potassium: 3.9 mEq/L (ref 3.5–5.1)
Sodium: 140 mEq/L (ref 136–145)
Total Bilirubin: <0.3 mg/dL (ref 0.20–1.20)
Total Protein: 6.9 g/dL (ref 6.4–8.3)

## 2016-05-30 MED ORDER — OXYCODONE HCL 20 MG PO TABS: 1.0000 | ORAL_TABLET | Freq: Four times a day (QID) | ORAL | 0 refills | Status: DC

## 2016-05-30 MED ORDER — ONDANSETRON HCL 8 MG PO TABS: 8.0000 mg | ORAL_TABLET | Freq: Two times a day (BID) | ORAL | 1 refills | Status: DC | PRN

## 2016-05-30 MED ORDER — OXYCODONE HCL ER 10 MG PO T12A: 20.0000 mg | EXTENDED_RELEASE_TABLET | Freq: Two times a day (BID) | ORAL | Status: DC

## 2016-05-30 MED ORDER — HEPARIN SOD (PORK) LOCK FLUSH 100 UNIT/ML IV SOLN
500.0000 [IU] | Freq: Once | INTRAVENOUS | Status: AC | PRN
  Administered 2016-05-30: 500 [IU] via INTRAVENOUS
  Filled 2016-05-30: qty 5

## 2016-05-30 MED ORDER — PROCHLORPERAZINE MALEATE 10 MG PO TABS: 10.0000 mg | ORAL_TABLET | Freq: Four times a day (QID) | ORAL | 1 refills | Status: DC | PRN

## 2016-05-30 MED ORDER — OXYMORPHONE HCL ER 20 MG PO T12A: 20.0000 mg | EXTENDED_RELEASE_TABLET | Freq: Two times a day (BID) | ORAL | 0 refills | Status: DC | PRN

## 2016-05-30 MED ORDER — SODIUM CHLORIDE 0.9 % IJ SOLN
10.0000 mL | INTRAMUSCULAR | Status: DC | PRN
  Administered 2016-05-30: 10 mL via INTRAVENOUS
  Filled 2016-05-30: qty 10

## 2016-05-30 NOTE — Patient Instructions (Signed)

## 2016-05-30 NOTE — Telephone Encounter (Signed)
This RN spoke with [facility] per pt's visit today to be re-establish care under [name] as well as to initiate further chemo.  Per [place]- plan is for Dr Bradley Ferris to contact [name] to discuss needed care for appropriate transfer to other ancillary services

## 2016-06-01 ENCOUNTER — Other Ambulatory Visit: Payer: Self-pay | Admitting: *Deleted

## 2016-06-02 ENCOUNTER — Telehealth: Payer: Self-pay | Admitting: *Deleted

## 2016-06-02 NOTE — Telephone Encounter (Signed)
This RN spoke with pt per her call - [name] states " I do not want to do chemo "-  Per further inquiry- pt states " I am scared of the chemo and I not want to be left alone "  [name] states she is scared the chemo will make her feel worse and she lives alone - this RN validated her concerns.  Overall goal of care is to give best care with least amount of side effects- chemo may cause more side effects as it is trying to overcome the known cancer in her body.   Presently [name] has side effects of the cancer which she can manage fair on her own -   [name] likes the nurses that come in with Hospice and would like to keep their services under hospice not pallative care.  Plan per call is to keep hospice care in her home at present- she is to come in Tuesday as scheduled so she can discuss her concerns further with NP.  This RN contacted [facility] and informed them of above decisions- with plan to continue care at present - and follow up post visit on 8/1.  This RN then received a second call from pt - stating  " my husband wants me to get chemo- he is scared if I don't I will only get worse and I have an open wound that smells "  Per this call - informed pt hospice will continue at present - she is to come in as scheduled - see NP and discuss choices- chemo appointment will not be canceled until she is seen and verifies her decisions.  This RN did inquire if husband would be with her - she stated he will not be able to come with her but her daughter will be with her

## 2016-06-03 ENCOUNTER — Other Ambulatory Visit: Payer: Self-pay | Admitting: Oncology

## 2016-06-06 ENCOUNTER — Other Ambulatory Visit: Payer: Self-pay | Admitting: *Deleted

## 2016-06-06 ENCOUNTER — Ambulatory Visit (HOSPITAL_COMMUNITY)
Admission: RE | Admit: 2016-06-06 | Discharge: 2016-06-06 | Disposition: A | Discharge status: Home / Self Care | Payer: Commercial Managed Care - PPO | Source: Ambulatory Visit | Attending: Oncology | Admitting: Oncology

## 2016-06-06 DIAGNOSIS — I10 Essential (primary) hypertension: Secondary | ICD-10-CM | POA: Diagnosis not present

## 2016-06-06 DIAGNOSIS — C50211 Malignant neoplasm of upper-inner quadrant of right female breast: Secondary | ICD-10-CM | POA: Diagnosis not present

## 2016-06-06 DIAGNOSIS — C4911 Malignant neoplasm of connective and soft tissue of right upper limb, including shoulder: Secondary | ICD-10-CM | POA: Insufficient documentation

## 2016-06-06 DIAGNOSIS — Z09 Encounter for follow-up examination after completed treatment for conditions other than malignant neoplasm: Secondary | ICD-10-CM | POA: Diagnosis present

## 2016-06-06 NOTE — Progress Notes (Signed)
   Echocardiogram 2D Echocardiogram has been performed.  [name] 06/06/2016, 10:55 AM

## 2016-06-07 ENCOUNTER — Ambulatory Visit (HOSPITAL_BASED_OUTPATIENT_CLINIC_OR_DEPARTMENT_OTHER): Payer: Commercial Managed Care - PPO

## 2016-06-07 ENCOUNTER — Other Ambulatory Visit (HOSPITAL_BASED_OUTPATIENT_CLINIC_OR_DEPARTMENT_OTHER): Payer: Commercial Managed Care - PPO

## 2016-06-07 ENCOUNTER — Ambulatory Visit (HOSPITAL_BASED_OUTPATIENT_CLINIC_OR_DEPARTMENT_OTHER): Payer: Commercial Managed Care - PPO | Admitting: Nurse Practitioner

## 2016-06-07 ENCOUNTER — Other Ambulatory Visit: Payer: Commercial Managed Care - PPO

## 2016-06-07 ENCOUNTER — Other Ambulatory Visit: Payer: Self-pay | Admitting: *Deleted

## 2016-06-07 ENCOUNTER — Telehealth: Payer: Self-pay | Admitting: *Deleted

## 2016-06-07 ENCOUNTER — Ambulatory Visit: Payer: Commercial Managed Care - PPO

## 2016-06-07 ENCOUNTER — Ambulatory Visit: Payer: Commercial Managed Care - PPO | Admitting: Nurse Practitioner

## 2016-06-07 VITALS — BP 129/93 | HR 92

## 2016-06-07 VITALS — BP 98/68 | HR 95 | Temp 98.4°F | Resp 18 | Ht 65.0 in | Wt 171.1 lb

## 2016-06-07 DIAGNOSIS — C4911 Malignant neoplasm of connective and soft tissue of right upper limb, including shoulder: Secondary | ICD-10-CM

## 2016-06-07 DIAGNOSIS — I890 Lymphedema, not elsewhere classified: Secondary | ICD-10-CM

## 2016-06-07 DIAGNOSIS — Z5111 Encounter for antineoplastic chemotherapy: Secondary | ICD-10-CM

## 2016-06-07 DIAGNOSIS — Z5112 Encounter for antineoplastic immunotherapy: Secondary | ICD-10-CM

## 2016-06-07 DIAGNOSIS — C770 Secondary and unspecified malignant neoplasm of lymph nodes of head, face and neck: Secondary | ICD-10-CM

## 2016-06-07 DIAGNOSIS — M549 Dorsalgia, unspecified: Secondary | ICD-10-CM

## 2016-06-07 DIAGNOSIS — C50211 Malignant neoplasm of upper-inner quadrant of right female breast: Secondary | ICD-10-CM

## 2016-06-07 DIAGNOSIS — C778 Secondary and unspecified malignant neoplasm of lymph nodes of multiple regions: Secondary | ICD-10-CM

## 2016-06-07 DIAGNOSIS — G8929 Other chronic pain: Secondary | ICD-10-CM

## 2016-06-07 DIAGNOSIS — M79601 Pain in right arm: Secondary | ICD-10-CM

## 2016-06-07 DIAGNOSIS — Z95828 Presence of other vascular implants and grafts: Secondary | ICD-10-CM

## 2016-06-07 DIAGNOSIS — Z86718 Personal history of other venous thrombosis and embolism: Secondary | ICD-10-CM

## 2016-06-07 DIAGNOSIS — Z170 Estrogen receptor positive status [ER+]: Secondary | ICD-10-CM

## 2016-06-07 LAB — CBC WITH DIFFERENTIAL/PLATELET
BASO%: 0.4 % (ref 0.0–2.0)
Basophils Absolute: 0 10*3/uL (ref 0.0–0.1)
EOS%: 1.4 % (ref 0.0–7.0)
Eosinophils Absolute: 0.2 10*3/uL (ref 0.0–0.5)
HCT: 38.1 % (ref 34.8–46.6)
HGB: 12.7 g/dL (ref 11.6–15.9)
LYMPH%: 8.8 % — ABNORMAL LOW (ref 14.0–49.7)
MCH: 27.2 pg (ref 25.1–34.0)
MCHC: 33.3 g/dL (ref 31.5–36.0)
MCV: 81.6 fL (ref 79.5–101.0)
MONO#: 1.2 10*3/uL — ABNORMAL HIGH (ref 0.1–0.9)
MONO%: 11.5 % (ref 0.0–14.0)
NEUT#: 8.4 10*3/uL — ABNORMAL HIGH (ref 1.5–6.5)
NEUT%: 77.9 % — ABNORMAL HIGH (ref 38.4–76.8)
Platelets: 335 10*3/uL (ref 145–400)
RBC: 4.67 10*6/uL (ref 3.70–5.45)
RDW: 13.6 % (ref 11.2–14.5)
WBC: 10.8 10*3/uL — ABNORMAL HIGH (ref 3.9–10.3)
lymph#: 1 10*3/uL (ref 0.9–3.3)

## 2016-06-07 LAB — COMPREHENSIVE METABOLIC PANEL
ALT: 15 U/L (ref 0–55)
AST: 39 U/L — ABNORMAL HIGH (ref 5–34)
Albumin: 2.7 g/dL — ABNORMAL LOW (ref 3.5–5.0)
Alkaline Phosphatase: 98 U/L (ref 40–150)
Anion Gap: 10 mEq/L (ref 3–11)
BUN: 13.4 mg/dL (ref 7.0–26.0)
CO2: 27 mEq/L (ref 22–29)
Calcium: 9.6 mg/dL (ref 8.4–10.4)
Chloride: 100 mEq/L (ref 98–109)
Creatinine: 0.9 mg/dL (ref 0.6–1.1)
EGFR: 83 mL/min/{1.73_m2} — ABNORMAL LOW (ref >90)
Glucose: 139 mg/dl (ref 70–140)
Potassium: 4.1 mEq/L (ref 3.5–5.1)
Sodium: 137 mEq/L (ref 136–145)
Total Bilirubin: 0.31 mg/dL (ref 0.20–1.20)
Total Protein: 7.4 g/dL (ref 6.4–8.3)

## 2016-06-07 MED ORDER — SODIUM CHLORIDE 0.9 % IV SOLN
840.0000 mg | Freq: Once | INTRAVENOUS | Status: AC
  Administered 2016-06-07: 840 mg via INTRAVENOUS
  Filled 2016-06-07: qty 28

## 2016-06-07 MED ORDER — TRASTUZUMAB CHEMO 150 MG IV SOLR
8.0000 mg/kg | Freq: Once | INTRAVENOUS | Status: AC
  Administered 2016-06-07: 651 mg via INTRAVENOUS
  Filled 2016-06-07: qty 31

## 2016-06-07 MED ORDER — SODIUM CHLORIDE 0.9% FLUSH
10.0000 mL | INTRAVENOUS | Status: DC | PRN
Start: 2016-06-07 — End: 2016-06-07
  Administered 2016-06-07: 10 mL
  Filled 2016-06-07: qty 10

## 2016-06-07 MED ORDER — OXYCODONE HCL 15 MG PO TABS: 30.0000 mg | ORAL_TABLET | ORAL | 0 refills | Status: DC | PRN

## 2016-06-07 MED ORDER — HYDROCODONE-HOMATROPINE 5-1.5 MG/5ML PO SYRP: 5.0000 mL | ORAL_SOLUTION | Freq: Four times a day (QID) | ORAL | 0 refills | Status: DC | PRN

## 2016-06-07 MED ORDER — OXYCODONE HCL ER 30 MG PO T12A: 30.0000 mg | EXTENDED_RELEASE_TABLET | Freq: Two times a day (BID) | ORAL | 0 refills | Status: DC

## 2016-06-07 MED ORDER — ACETAMINOPHEN 325 MG PO TABS
650.0000 mg | ORAL_TABLET | Freq: Once | ORAL | Status: AC
  Administered 2016-06-07: 650 mg via ORAL

## 2016-06-07 MED ORDER — OXYCODONE HCL 5 MG PO TABS
15.0000 mg | ORAL_TABLET | Freq: Once | ORAL | Status: AC
  Administered 2016-06-07: 15 mg via ORAL
  Filled 2016-06-07 (×2): qty 3

## 2016-06-07 MED ORDER — PROCHLORPERAZINE MALEATE 10 MG PO TABS
10.0000 mg | ORAL_TABLET | Freq: Once | ORAL | Status: AC
  Administered 2016-06-07: 10 mg via ORAL

## 2016-06-07 MED ORDER — HEPARIN SOD (PORK) LOCK FLUSH 100 UNIT/ML IV SOLN
500.0000 [IU] | Freq: Once | INTRAVENOUS | Status: AC | PRN
  Administered 2016-06-07: 500 [IU]
  Filled 2016-06-07: qty 5

## 2016-06-07 MED ORDER — DIPHENHYDRAMINE HCL 25 MG PO CAPS
25.0000 mg | ORAL_CAPSULE | Freq: Once | ORAL | Status: AC
  Administered 2016-06-07: 25 mg via ORAL

## 2016-06-07 MED ORDER — SODIUM CHLORIDE 0.9 % IJ SOLN
10.0000 mL | INTRAMUSCULAR | Status: DC | PRN
  Filled 2016-06-07: qty 10

## 2016-06-07 MED ORDER — SODIUM CHLORIDE 0.9 % IV SOLN
Freq: Once | INTRAVENOUS | Status: AC
  Administered 2016-06-07: 12:00:00 via INTRAVENOUS

## 2016-06-07 MED ORDER — ACETAMINOPHEN 325 MG PO TABS
ORAL_TABLET | ORAL | Status: AC
  Filled 2016-06-07: qty 2

## 2016-06-07 MED ORDER — PACLITAXEL PROTEIN-BOUND CHEMO INJECTION 100 MG
125.0000 mg/m2 | Freq: Once | INTRAVENOUS | Status: AC
  Administered 2016-06-07: 250 mg via INTRAVENOUS
  Filled 2016-06-07: qty 50

## 2016-06-07 MED ORDER — DIPHENHYDRAMINE HCL 25 MG PO CAPS
ORAL_CAPSULE | ORAL | Status: AC
  Filled 2016-06-07: qty 1

## 2016-06-07 MED ORDER — PROCHLORPERAZINE MALEATE 10 MG PO TABS
ORAL_TABLET | ORAL | Status: AC
  Filled 2016-06-07: qty 1

## 2016-06-07 NOTE — Telephone Encounter (Signed)
Anthoney Harada S.W. with Hospice called inquiring what time patient scheduled today for [facility].   Looking for patient to sign forms.  Here now and will be here until 5:00 pm.  Call transfererd to collaborative extension [phone].

## 2016-06-07 NOTE — Patient Instructions (Signed)

## 2016-06-07 NOTE — Progress Notes (Signed)
Patient complains of chronic right arm pain, rating the pain a 9 on the 0 to 10 pain scale. Patient states she took oxycodone at 0700 this morning. Selena Lesser, NP notified. Order given and carried out for 15 mg Oxycodone once PO. Patient and daughter verbalized understanding to the above mentioned plan.

## 2016-06-07 NOTE — Patient Instructions (Addendum)
  Take oxycontin 30 mg every morning and night (no matter what) (This is your long acting pain medication)  Take oxycodone 15mg  (2 pills which will equal 30mg ) every 3 hours as needed for pain - this is your immediate relief pain medicine. If this doesn't relieve your pain please call the office.   Continue to take stool softeners with the pain medicine.

## 2016-06-07 NOTE — Patient Instructions (Signed)
[facility] Discharge Instructions for Patients Receiving Chemotherapy  Today you received the following chemotherapy agents :  Herceptin, Perjeta, Abraxane.  To help prevent nausea and vomiting after your treatment, we encourage you to take your nausea medication as prescribed.  Take Zofran 8 mg by mouth every 12 hours as needed for nausea.  Can also take Compazine 10 mg by mouth every 6 hours as needed for nausea.  DO NOT Drive after taking Compazine as it can cause drowsiness.  DO NOT Eat  Greasy  Nor  Spicy  Foods.  DO Drink  Lots  Of  Fluids  As  Tolerated.   If you develop nausea and vomiting that is not controlled by your nausea medication, call the clinic.   BELOW ARE SYMPTOMS THAT SHOULD BE REPORTED IMMEDIATELY:  *FEVER GREATER THAN 100.5 F  *CHILLS WITH OR WITHOUT FEVER  NAUSEA AND VOMITING THAT IS NOT CONTROLLED WITH YOUR NAUSEA MEDICATION  *UNUSUAL SHORTNESS OF BREATH  *UNUSUAL BRUISING OR BLEEDING  TENDERNESS IN MOUTH AND THROAT WITH OR WITHOUT PRESENCE OF ULCERS  *URINARY PROBLEMS  *BOWEL PROBLEMS  UNUSUAL RASH Items with * indicate a potential emergency and should be followed up as soon as possible.  Feel free to call the clinic you have any questions or concerns. The clinic phone number is [phone].  Please show the [facility] at check-in to the Emergency Department and triage nurse.

## 2016-06-07 NOTE — Progress Notes (Signed)
[place]  [phone] [phone]  OFFICE PROGRESS NOTE   ID: [name]   DOB: [birth year]  MR#: [id number]  NOM#:[id number]   PCP: [name], MD [name], [name], [name] RAD JGG:[id number] [name], [name] OTHER:  [name], MD, [name]  CHIEF COMPLAINT:  Stage IV Breast Cancer, angiosarcoma  CURRENT TREATMENT: Abraxane, trastuzumab, pertuzumab  BREAST CANCER HISTORY: From the original intake note:  The patient originally presented in May 2003 when she noticed a lump in the upper inner quadrant of her right breast in September 2003.  She sought attention and had a mammogram which showed an obvious carcinoma in the upper inner quadrant of the right breast, approximately 2 cm.  There were some enlarged lymph nodes in the axilla and an FNA done showed those consistent with malignant cells, most likely an invasive ductal carcinoma.  At that point she was unsure of what to do and was referred by Dr. [name] for a discussion of her treatment options.  By biopsy, it was ER/PR negative and HER-2 was 3+.  DNA index was 1.42.  We reiterated that it was most important to have her disease surgically addressed at that point and had recommended lumpectomy and axillary nodes to be addressed with which she followed through and on 04-03-02, Dr. [name] performed a right partial mastectomy and a right axillary lymph node dissection.  Final pathology revealed a 2.4 cm. high grade, Grade III invasive ductal carcinoma with an adjacent .8 cm. also high grade invasive ductal carcinoma which was felt to represent an intramammary metastasis rather than a second primary.  A smaller mass was just medial to the larger mass.  The smaller mass was associated with high grade DCIS component.  There was no definite lymphovascular invasion identified.  However, one of fourteen axillary lymph nodes did contain a 1.5 cm. metastatic  deposit.  Postoperatively she did very well.  We reiterated the fact that she did need adjuvant chemotherapy, however, she refused and decided that she would pursue radiation.  She received radiation and completed that on 08-14-02. Her subsequent history is as detailed below.   INTERVAL HISTORY: [name] is seen today prior to planned chemotherapy with Abraxane, Trastuzumab and pertuzumab. She called yesterday stating she didn't want to get any more chemo and was pleased with hospice, but that her husband wanted her to get more chemo.  Today she is here with her daughter and states that she has thought more about it and she thinks her husband is right, that the odor and wound in her arm and back will not get better unless she tries more chemo. She had an echo yesterday which showed an ejection fraction of 60-65%. She was discharged from hospice so that she can get more chemo.  She is tolerating food ok with no complaints of nausea, vomiting, constipation or diarrhea although she does take stool softeners daily to combat narcotic induced constipation.   The denuded skin and progression in her right back and right upper extremity is extensive with a bloody drainage, and an odor. Her arm is hardened and stiff and painful for her to move. She has trouble sleeping at night.She states she stopped going to the wound clinic - that it was no longer helping but she would like home care to assist her daily with dressing changes.  Her pain is not well controlled.  We had lengthy discussion about pain management and I explained at length to  her and her daughter the need for a long acting pain medicine as well as breakthrough medication. Initially Mistey was insistent that "oxycontin doesn't do anything so my husband threw it away, it was worthless." She did not understand before today the mechanism of action but was able to verbalize it today before leaving.  I changed her pain medicine to Oxycontin 30 mg every 12 hours  as well as OxyIR 30 mg every 3 hours for pain. I have told her to let us know if her pain is not controlled with this regimen.   She is seen today accompanied by her daughter [name]  REVIEW OF SYSTEMS:  As noted in HPI - constipation that is controlled, intermittent nausea, pain, and difficulty sleeping. Otherwise negative ROS.   PAST MEDICAL HISTORY: Past Medical History:  Diagnosis Date  . Breast cancer ([facility])   . DVT (deep venous thrombosis) ([facility]) 10/07/2011  . Hypertension   . Radiation 11/29/2005-12/29/2005   right supraclavicular area 4147 cGy  . Radiation 06/26/02-08/14/02   right breast 5040 cGy, tumor bed boosted to 1260 cGy  . Rheumatic fever     PAST SURGICAL HISTORY: Past Surgical History:  Procedure Laterality Date  . BREAST SURGERY    . MASTECTOMY     bilateral  . PORT A CATH REVISION      FAMILY HISTORY Family History  Problem Relation Age of Onset  . Pancreatic cancer Mother   . Kidney cancer Sister 16  . Breast cancer Sister 9  . Breast cancer Other 48  The patient's father died in his 29F  from complications of alcohol abuse. The patient's mother died in her 27s from pancreatic cancer. The patient has 6 brothers, 2 sisters. Both her sisters have had breast cancer, both diagnosed after the age of 56. The patient has not had genetic testing so far.   GYNECOLOGIC HISTORY: Menarche age 7; first live birth age 66. She is GXP4. She is not sure when she stopped having periods. She never used hormone replacement.  SOCIAL HISTORY: The patient has worked in the past as a Psychologist, counselling. At home in addition to the patient are her husband [name], originally from [place], who works as a Administrator.  Also living in the home is her son [name] and her daughter [name] (she is studying to be a Marine scientist).  The other 2 children are [name], who has a college degree in psychology and works as a Probation officer; and [name], who lives in [place] and works as a  Higher education careers adviser.   ADVANCED DIRECTIVES: Not in place  HEALTH MAINTENANCE: Social History  Substance Use Topics  . Smoking status: Light Tobacco Smoker  . Smokeless tobacco: Never Used  . Alcohol use Yes     Comment: Rarely     Colonoscopy:  Not on file  PAP:  Not on file  Bone density:  Not on file  Lipid panel:  Not on file    Allergies  Allergen Reactions  . Tramadol Nausea Only  . Hydrocodone-Acetaminophen Itching and Rash    Current Outpatient Prescriptions  Medication Sig Dispense Refill  . alprazolam (XANAX) 2 MG tablet Take 2 mg by mouth daily.     . Ascorbic Acid (VITAMIN C) 100 MG tablet Take 100 mg by mouth daily.      . calcium-vitamin D (OSCAL WITH D) 500-200 MG-UNIT per tablet Take 1 tablet by mouth daily.      Marland Kitchen gabapentin (NEURONTIN) 300 MG capsule Take 1 capsule (300 mg total) by  mouth at bedtime. 90 capsule 4  . ondansetron (ZOFRAN) 8 MG tablet Take 1 tablet (8 mg total) by mouth 2 (two) times daily as needed (Nausea or vomiting). 30 tablet 1  . prochlorperazine (COMPAZINE) 10 MG tablet Take 1 tablet (10 mg total) by mouth every 6 (six) hours as needed (Nausea or vomiting). 30 tablet 1  . dextromethorphan-guaiFENesin (MUCINEX DM) 30-600 MG 12hr tablet Take 1 tablet by mouth 2 (two) times daily. 14 tablet 0  . HYDROcodone-homatropine (HYCODAN) 5-1.5 MG/5ML syrup Take 5 mLs by mouth every 6 (six) hours as needed for cough. 120 mL 0  . Multiple Vitamin (MULTIVITAMIN) capsule Take 1 capsule by mouth daily.      . Oxycodone HCl 20 MG TABS Take 1 tablet (20 mg total) by mouth every 6 (six) hours. 120 tablet 0  . Oxymorphone HCl, Crush Resist, 20 MG T12A Take 20 mg by mouth 2 (two) times daily as needed. 60 tablet 0   No current facility-administered medications for this visit.    Facility-Administered Medications Ordered in Other Visits  Medication Dose Route Frequency Provider Last Rate Last Dose  . sodium chloride 0.9 % injection 10 mL  10 mL Intravenous PRN Chauncey Cruel, MD   10 mL at 03/04/14 1421  . sodium chloride 0.9 % injection 10 mL  10 mL Intravenous PRN Chauncey Cruel, MD        OBJECTIVE: Middle-aged African American woman   Vitals:   06/07/16 0916  BP: 98/68  Pulse: 95  Resp: 18  Temp: 98.4 F (36.9 C)     Body mass index is 28.47 kg/m.    ECOG FS: 2 Filed Weights   06/07/16 0916  Weight: 171 lb 1.6 oz (77.6 kg)   PAC in place, no erythema noted Sclerae unicteric, EOMs intact Oropharynx clear, full upper plate Bilateral cervical adenopathy, right greater than left. Lungs no rales or rhonchi Heart regular rate and rhythm Abd soft, nontender, positive bowel sounds MSK chronic right upper extremity lymphedema, with the upper arm with extensive ulcerations - SEE PICTURES BELOW Neuro: nonfocal, well oriented, positive affect Breasts: Deferred   1 photo right upper back today 8/1/207    2 Photos right arm posterior 06/07/2016      1 Photo right arm anterior 06/07/2016    LAB RESULTS:.  Lab Results  Component Value Date   WBC 10.8 (H) 06/07/2016   NEUTROABS 8.4 (H) 06/07/2016   HGB 12.7 06/07/2016   HCT 38.1 06/07/2016   MCV 81.6 06/07/2016   PLT 335 06/07/2016      Chemistry      Component Value Date/Time   NA 140 05/30/2016 1336   K 3.9 05/30/2016 1336   CL 100 08/14/2014 0100   CL 101 03/07/2013 1411   CO2 31 (H) 05/30/2016 1336   BUN 12.6 05/30/2016 1336   CREATININE 0.8 05/30/2016 1336      Component Value Date/Time   CALCIUM 8.8 05/30/2016 1336   ALKPHOS 96 05/30/2016 1336   AST 28 05/30/2016 1336   ALT 12 05/30/2016 1336   BILITOT <0.30 05/30/2016 1336       STUDIES: No results found. Patient: [name], [name] Collected: 04/23/2015 Client: [facility] Accession: [id number] Received: 04/23/2015 Darene Lamer. Hoxworth, MD DOB: 1956-05-08 Age: 77 Gender: F Reported: 05/06/2015 [address], [place] Patient Ph: [phone] MRN #: [id number] [place], [place]  [id number] Client Acc#: Chart #: [id number] Phone: Fax: CC: Lurline Del, MD REPORT OF  DERMATOPATHOLOGY FINAL DIAGNOSIS and MICROSCOPIC DESCRIPTION Diagnosis Skin , right upper arm AMENDED DIAGNOSIS: ATYPICAL VASCULAR PROLIFERATION, SEE DESCRIPTION. Microscopic Description The biopsy was re-reviewed based on additional clinical information provided, and immunohistochemical stains have been performed. This is a small punch biopsy with significant crush artifact, limiting the evaluation of the cytomorphology and the overall histologic details. There is a proliferation of spindle cells diffusely present in the dermis in a background of thickened collagen bundles with some clefting. These cells have hyperchromatic nuclei and abundant amphophilic cytoplasm. Some mitotic figures are noted. There is edema in the papillary dermis. The epidermis shows reactive changes and focal ulceration. In an attempt to determine the lineage of these cells, immunohistochemical stains are performed revealing the cells to be positive for the vascular marker CD31 and negative for cytokeratin AE1/AE3, S100, CD68 and Factor XIIIa. The changes are those of a vascular proliferation with atypical features suspicious for angiosarcoma. A larger biopsy is recommended to help further evaluate this process and confirm the diagnosis. This case was discussed with [name] on 05/05/2015. The slides have also been reviewed by Drs. Kathee Delton, who are in agreement with the diagnosis. Margette Fast MD Dermatopathologist, Electronic Signature (Case signed 05/06/2015)   ASSESSMENT:  60 y.o. [place] woman with stage IV breast cancer manifested chiefly by loco-regional nodal disease (neck, chest), without lung, liver, bone, or brain involvement.  (1) Status post right lumpectomy and sentinel lymph node sampling 03/04/2002 for 2 separate foci of invasive ductal carcinoma, mpT2 pN1 or stage IIB, both foci grade 3, both estrogen  and progesterone receptor positive, both HercepTest 3+, Mib-1 56%  (2) Reexcision for margins 05/27/2002 showed no residual cancer in the breast.  (3) The patient refused adjuvant systemic therapy.  (4) Adjuvant radiation treatment completed 08/14/2002.  (5) recurrence in the right breast in 02/2004 showing a morphologically different tumor, again grade 3, again estrogen and progesterone receptor negative, with an MIB-1 of 14% and Herceptest 3+.  (5) Between 03/2004 and 07/2004 she received dose dense Doxorubicin/Cyclophosphamide x 4 given with trastuzumab, followed by weekly Docetaxel x 8, again given with trastuzumab.  (6) Right mastectomy 07/13/2004 showed scattered microscopic foci of residual disease over an area  greater than 5 cm. Margins were negative.  (7) Postoperative Docetaxel continued until 09/2004.  (8) Trastuzumab (Herceptin) given 08/2004 through 01/2012 with some brief interruptions.  (9) Isolated right cervical nodal recurrence 10/2005, treated with radiation to the right supraclavicular area (total 41.5 gray) completed 12/29/2005.  (10) Navelbine given together with Herceptin  11/2005 through 03/2006.  (9) Left mastectomy 02/13/2006 for ductal carcinoma in situ, grade 2, estrogen and progesterone receptor negative, with negative margins; 0 of 3 lymph nodes involved  (10) treated with Lapatinib and Capecitabine before 10/2009, for an unclear duration and with unclear results (cannot locate data on chart review).  (11) Status post right supraclavicular lymph node biopsy 09/2010 again positive for an invasive ductal carcinoma, estrogen and progesterone receptor negative, HER-2 positive by CISH with a ratio 4.25.  (12) Navelbine given together with Herceptin between 05/2011 and 11/2011.  (13) Carboplatin/ Gemcitabine/ Herceptin given for 2 cycles, in 12/2011 and 01/2012.  (14) TDM-1 ([place]) started 02/2012. Last dose 10/02/2013 after which the patient discontinued  treatment at her own discretion. Echo on December 2014 showed a well preserved ejection fraction.  (15) Deep vein thrombosis of the right upper extremity documented 04/20/2011.  She completed anticoagulant therapy with Coumadin on 03/25/2013.  (16) Chronic right upper extremity lymphedema, not responsive  to aggressive PT  (a) biopsy of denuded area 04/23/2015 read as dermatofibroma (discordant)  (b) deeper cuts of 04/23/2015 biopsy suggest angiosarcoma  (17) Right chest port-a-cath removal due to infection on 01/28/2013. Left chest Port-A-Cath placed on 04/08/2013; being flushed every 6 weeks  RIGHT UPPER EXTREMITY ANGIOSARCOMA VS BREAST CANCER: (18) treated at [facility] [place] August 2016 with paclitaxel, trastuzumab and pertuzumab 1 cycle, afterwards referred to hospice  (19) under the care of hosp[facility] of [facility] August 2016 05/08/2016, - discharged yesterday 06/06/2016 when the patient opted for a second try at chemotherapy  (20) To start low-dose Abraxane, trastuzumab and pertuzumab  Today - 06/07/2016, repeated every 3 weeks.   (a) pretreatment echocardiogram showed EF 60-65%              (b) MRI of brain, bone scan, and CT scan of chest/abd/pelvis pending and will be completed later this week  (21) due to progression of arm and back wounds, ordering home care to come in daily to change wound dressings  (22) Pain in arm and back   PLAN: I spent approximately 1 hour with [name] and her daughter today going over [place]'s situation. She will proceed with cycle 1 today of Abraxane, Pertuzumab and Trastuzumab.   Talita has been under hospice care for several months and is very appreciative of the support she has received, but is concerned because her arm has gotten progressively worse. She was discharged from hospice so that she could receive palliative chemo.    She had a trial of methadone for pain management with hospice and felt it did not address her pain  adequately. She is in pain today and requested percocet. She stated the oxycontin 20 mg "did nothing at all so my husband got rid of them."   Lengthy discussion with her and her daughter regarding the principles of long acting and short acting pain medicines.   I do not want her taking any pain medicine that has acetaminophen component because it would mask a fever, and if she took too many of them, could affect her liver function. She finally agreed to try Oxycontin 75m every 12 hours with Oxycontin 330mevery 3 hours for breakthrough pain.  She is hopeful that the chemo will reduce the pain, and take away the odor and hardness in her arm. I hope she is right. We are going to see her back next week to review scan results - she has CT of chest/abd/pelvis and MRI of the brain this coming Friday 06/10/2016. She has a bone scan next Monday. Her echo showed normal heart function with an EF of 60-65%/   She will see Dr. MaJana Hakimgain on August 9 to make sure she tolerated treatment well, to review scans, and to evaluate her response to the change in pain medicine.   The overall plan at least initially is for 3 cycles of treatment followed by restaging studies  [place] good understanding of the possible toxicities side effects and complications of this treatment. She is agreeable to proceeding. She knows to call for any problems that may develop before her next visit.  SaBill SalinasNP, AOCNP  06/07/2016 9:27 AM

## 2016-06-08 ENCOUNTER — Other Ambulatory Visit: Payer: Self-pay | Admitting: Oncology

## 2016-06-08 ENCOUNTER — Other Ambulatory Visit: Payer: Self-pay

## 2016-06-08 ENCOUNTER — Telehealth: Payer: Self-pay

## 2016-06-08 ENCOUNTER — Telehealth: Payer: Self-pay | Admitting: *Deleted

## 2016-06-08 DIAGNOSIS — C4911 Malignant neoplasm of connective and soft tissue of right upper limb, including shoulder: Secondary | ICD-10-CM

## 2016-06-08 DIAGNOSIS — C50211 Malignant neoplasm of upper-inner quadrant of right female breast: Secondary | ICD-10-CM

## 2016-06-08 DIAGNOSIS — I890 Lymphedema, not elsewhere classified: Principal | ICD-10-CM

## 2016-06-08 DIAGNOSIS — E8989 Other postprocedural endocrine and metabolic complications and disorders: Secondary | ICD-10-CM

## 2016-06-08 NOTE — Telephone Encounter (Signed)
Rx note says filled on 8/2.  Marking done

## 2016-06-08 NOTE — Telephone Encounter (Signed)
Spoke with pt for post chemo follow up call.  Stated doing ok; good appetite, drinking lots of fluids as tolerated.  Stated has ongoing chronic pain - taking pain meds as prescribed.  Taking stool softener to help with constipation problem; bladder functions fine.  Had nausea, but taking antiemetics with relief.  Pt aware of next appt with Dr. Jana Hakim.

## 2016-06-08 NOTE — Telephone Encounter (Signed)
Request rcvd from [facility] for prior authorization for Oxy IR.  Placed in [place] box.

## 2016-06-09 ENCOUNTER — Telehealth: Payer: Self-pay | Admitting: *Deleted

## 2016-06-09 ENCOUNTER — Ambulatory Visit: Payer: Commercial Managed Care - PPO | Admitting: Physical Therapy

## 2016-06-09 ENCOUNTER — Telehealth: Payer: Self-pay

## 2016-06-09 NOTE — Telephone Encounter (Signed)
Called [facility] and s/w Santiago Glad about referral for wound care. Santiago Glad said she should be having a home visit today. She will look into where it stands.  Called [facility] and s/w [facility]. Gave VO for palliative care referral. Stanton Kidney will set it up.  Called pt and let her know of [facility] and palliative care consults.

## 2016-06-09 NOTE — Telephone Encounter (Signed)
Received a call from Katharine Look -admission nurse- [place]. Nurse was at pt's home to assess and change dressing to wound to pt's upper rt arm and upper back. Nurse wanted to know if the wound looked like red with darkened areas around the edges. I told the nurse what she is describing is exactly what the wound looked like 2 days ago. Verified with nurse that it sounds like the wound is unchanged by her description as compared to what was seen in office 2 days ago. Nurse will train her daughter and pt's aide  to do the dressing changes being that insurance does not cover daily wound dressing changes. [facility] go 2 more days this week  and 3 days next week.

## 2016-06-10 ENCOUNTER — Ambulatory Visit (HOSPITAL_COMMUNITY): Payer: Commercial Managed Care - PPO

## 2016-06-10 ENCOUNTER — Encounter: Payer: Self-pay | Admitting: Oncology

## 2016-06-10 ENCOUNTER — Telehealth: Payer: Self-pay

## 2016-06-10 NOTE — Progress Notes (Signed)
Sent [employer] for ondansetron prior auth

## 2016-06-10 NOTE — Telephone Encounter (Signed)
Prior auth form for oxycodone 15mg  received and placed in [name]'s in box

## 2016-06-13 ENCOUNTER — Encounter: Payer: Self-pay | Admitting: Oncology

## 2016-06-13 ENCOUNTER — Telehealth: Payer: Self-pay | Admitting: *Deleted

## 2016-06-13 ENCOUNTER — Encounter (HOSPITAL_COMMUNITY): Payer: Commercial Managed Care - PPO

## 2016-06-13 ENCOUNTER — Ambulatory Visit (HOSPITAL_COMMUNITY): Payer: Commercial Managed Care - PPO

## 2016-06-13 NOTE — Telephone Encounter (Signed)
This RN spoke with pt per her call stating need " for my dressing not to be just a dry dressing because it pulls my skin off when they change it and it hurts "  This RN informed Riya Caddell would be contacted per above -  Per further discussion Derry states she is having diarrhea but has taken imodium and " nothing coming out but gas"  Per conversation Jannell is managing symptoms with use of above as well as drinking ensure for calories due to " seems when I eat it upsets my stomach ".  Wylene is scheduled to come in this Wednesday.  Post above call this RN contacted [facility] and was able to speak with Red Team nurse leader - Nira Conn per above dressing concerns.  She states wound nurse has assessed and sent orders for additional dressing including use of moistened gauze.  Orders have not been implemented due to need for MD approval.  Above given MD approval.  Note number to reach Nira Conn directly is [phone]

## 2016-06-13 NOTE — Progress Notes (Signed)
Per [employer] oxycodone approved 06/12/16-11/06/16 under medicare part d. I sent to medical recds     Ref-pa# [id number]

## 2016-06-15 ENCOUNTER — Ambulatory Visit (HOSPITAL_BASED_OUTPATIENT_CLINIC_OR_DEPARTMENT_OTHER): Payer: Commercial Managed Care - PPO | Admitting: Oncology

## 2016-06-15 ENCOUNTER — Other Ambulatory Visit (HOSPITAL_BASED_OUTPATIENT_CLINIC_OR_DEPARTMENT_OTHER): Payer: Commercial Managed Care - PPO

## 2016-06-15 VITALS — BP 105/66 | HR 79 | Temp 98.4°F | Resp 18 | Ht 65.0 in | Wt 163.7 lb

## 2016-06-15 DIAGNOSIS — Z170 Estrogen receptor positive status [ER+]: Secondary | ICD-10-CM

## 2016-06-15 DIAGNOSIS — C50211 Malignant neoplasm of upper-inner quadrant of right female breast: Secondary | ICD-10-CM | POA: Diagnosis not present

## 2016-06-15 DIAGNOSIS — C4911 Malignant neoplasm of connective and soft tissue of right upper limb, including shoulder: Secondary | ICD-10-CM

## 2016-06-15 DIAGNOSIS — I890 Lymphedema, not elsewhere classified: Secondary | ICD-10-CM

## 2016-06-15 DIAGNOSIS — C778 Secondary and unspecified malignant neoplasm of lymph nodes of multiple regions: Secondary | ICD-10-CM

## 2016-06-15 DIAGNOSIS — Z86718 Personal history of other venous thrombosis and embolism: Secondary | ICD-10-CM

## 2016-06-15 LAB — COMPREHENSIVE METABOLIC PANEL
ALT: 9 U/L (ref 0–55)
AST: 19 U/L (ref 5–34)
Albumin: 2.9 g/dL — ABNORMAL LOW (ref 3.5–5.0)
Alkaline Phosphatase: 98 U/L (ref 40–150)
Anion Gap: 9 mEq/L (ref 3–11)
BUN: 14.2 mg/dL (ref 7.0–26.0)
CO2: 28 mEq/L (ref 22–29)
Calcium: 9.6 mg/dL (ref 8.4–10.4)
Chloride: 101 mEq/L (ref 98–109)
Creatinine: 0.8 mg/dL (ref 0.6–1.1)
EGFR: >90 mL/min/{1.73_m2} (ref >90)
Glucose: 102 mg/dl (ref 70–140)
Potassium: 4 mEq/L (ref 3.5–5.1)
Sodium: 138 mEq/L (ref 136–145)
Total Bilirubin: <0.3 mg/dL (ref 0.20–1.20)
Total Protein: 7.4 g/dL (ref 6.4–8.3)

## 2016-06-15 LAB — CBC WITH DIFFERENTIAL/PLATELET
BASO%: 0.9 % (ref 0.0–2.0)
Basophils Absolute: 0.1 10*3/uL (ref 0.0–0.1)
EOS%: 3 % (ref 0.0–7.0)
Eosinophils Absolute: 0.2 10*3/uL (ref 0.0–0.5)
HCT: 33.8 % — ABNORMAL LOW (ref 34.8–46.6)
HGB: 10.8 g/dL — ABNORMAL LOW (ref 11.6–15.9)
LYMPH%: 17.5 % (ref 14.0–49.7)
MCH: 25.9 pg (ref 25.1–34.0)
MCHC: 32.1 g/dL (ref 31.5–36.0)
MCV: 80.9 fL (ref 79.5–101.0)
MONO#: 0.7 10*3/uL (ref 0.1–0.9)
MONO%: 11.8 % (ref 0.0–14.0)
NEUT#: 3.9 10*3/uL (ref 1.5–6.5)
NEUT%: 66.8 % (ref 38.4–76.8)
Platelets: 404 10*3/uL — ABNORMAL HIGH (ref 145–400)
RBC: 4.18 10*6/uL (ref 3.70–5.45)
RDW: 13.9 % (ref 11.2–14.5)
WBC: 5.8 10*3/uL (ref 3.9–10.3)
lymph#: 1 10*3/uL (ref 0.9–3.3)

## 2016-06-15 MED ORDER — OXYCODONE HCL 15 MG PO TABS: 30.0000 mg | ORAL_TABLET | ORAL | 0 refills | Status: DC | PRN

## 2016-06-15 MED ORDER — OXYCODONE HCL ER 30 MG PO T12A
60.0000 mg | EXTENDED_RELEASE_TABLET | Freq: Two times a day (BID) | ORAL | 0 refills | Status: DC
Start: 2016-06-15 — End: 2016-06-20

## 2016-06-15 MED ORDER — PREDNISONE 5 MG PO TABS: 10.0000 mg | ORAL_TABLET | Freq: Every day | ORAL | 6 refills | Status: DC

## 2016-06-15 NOTE — Progress Notes (Signed)
[place]  Telephone:([phone]) [phone] Fax:([phone]) [phone]  OFFICE PROGRESS NOTE   ID: [name]   DOB: April 12, 1956  MR#: [id number]  XVQ#:[id number]   PCP: [name] Hey, MD SU: [name]. [name], [name]/[name], [name]/[name] Hoxworth MD RAD KDT:[id number] [name], [name] OTHER:  [name] Bickers, MD, [name] Fudge MD  CHIEF COMPLAINT:  Stage IV Breast Cancer, angiosarcoma  CURRENT TREATMENT: Abraxane, trastuzumab, pertuzumab  BREAST CANCER HISTORY: From the original intake note:  The patient originally presented in May 2003 when she noticed a lump in the upper inner quadrant of her right breast in September 2003.  She sought attention and had a mammogram which showed an obvious carcinoma in the upper inner quadrant of the right breast, approximately 2 cm.  There were some enlarged lymph nodes in the axilla and an FNA done showed those consistent with malignant cells, most likely an invasive ductal carcinoma.  At that point she was unsure of what to do and was referred by Dr. Marylene [name] for a discussion of her treatment options.  By biopsy, it was ER/PR negative and HER-2 was 3+.  DNA index was 1.42.  We reiterated that it was most important to have her disease surgically addressed at that point and had recommended lumpectomy and axillary nodes to be addressed with which she followed through and on 04-03-02, Dr. Annamaria [name] performed a right partial mastectomy and a right axillary lymph node dissection.  Final pathology revealed a 2.4 cm. high grade, Grade III invasive ductal carcinoma with an adjacent .8 cm. also high grade invasive ductal carcinoma which was felt to represent an intramammary metastasis rather than a second primary.  A smaller mass was just medial to the larger mass.  The smaller mass was associated with high grade DCIS component.  There was no definite lymphovascular invasion identified.  However, one of fourteen axillary lymph nodes did contain a 1.5 cm. metastatic  deposit.  Postoperatively she did very well.  We reiterated the fact that she did need adjuvant chemotherapy, however, she refused and decided that she would pursue radiation.  She received radiation and completed that on 08-14-02. Her subsequent history is as detailed below.   INTERVAL HISTORY: [name] returns today for follow-up of her angiosarcoma and breast cancer accompanied by her daughter [name]. She had her first cycle of low-dose Abraxane together with trastuzumab and pertuzumab 06/07/2016. She tolerated the treatment well and she feels the arm is shrinking a little and perhaps hurting a little less. However she "has been in bed all weak". She had nausea and vomiting problems. Her appetite is standard and she has no sense of taste. She continues to have significant pain. She does not feel the OxyContin is helping. While being treated she was given 3 of the OxyIR 15th (total 45 mg" and that did "takes the edge off" the pain. She is not doing that every 3 hours. She is not constipated. She had minimal diarrhea 1 day.  REVIEW OF SYSTEMS: [name] of course continues to have significant swelling of the right upper extremity. Home health and initially was doing dry dressings which were peeling of her skin as they came off. They are now doing a wet dressing which is much more comfortable for her. Initially they said she had 2 visits total but now they're agreeable and do, at least once a week if not twice a week as needed. [name] denies headaches, visual changes, dizziness or gait imbalance. There has been no cough. A detailed review of systems was  otherwise stable  PAST MEDICAL HISTORY: Past Medical History:  Diagnosis Date  . Breast cancer ([place])   . DVT (deep venous thrombosis) ([place]) 10/07/2011  . Hypertension   . Radiation 11/29/2005-12/29/2005   right supraclavicular area 4147 cGy  . Radiation 06/26/02-08/14/02   right breast 5040 cGy, tumor bed boosted to 1260 cGy  . Rheumatic fever      PAST SURGICAL HISTORY: Past Surgical History:  Procedure Laterality Date  . BREAST SURGERY    . MASTECTOMY     bilateral  . PORT A CATH REVISION      FAMILY HISTORY Family History  Problem Relation Age of Onset  . Pancreatic cancer Mother   . Kidney cancer Sister 62  . Breast cancer Sister 57  . Breast cancer Other 35  The patient's father died in his 54O  from complications of alcohol abuse. The patient's mother died in her 57s from pancreatic cancer. The patient has 6 brothers, 2 sisters. Both her sisters have had breast cancer, both diagnosed after the age of 76. The patient has not had genetic testing so far.   GYNECOLOGIC HISTORY: Menarche age 39; first live birth age 89. She is GXP4. She is not sure when she stopped having periods. She never used hormone replacement.  SOCIAL HISTORY: The patient has worked in the past as a Psychologist, counselling. At home in addition to the patient are her husband [name], originally from [place], who works as a Administrator.  Also living in the home is her son [name] and her daughter [name] (she is studying to be a Marine scientist).  The other 2 children are [name], who has a college degree in psychology and works as a Probation officer; and [name], who lives in [place] and works as a Higher education careers adviser.   ADVANCED DIRECTIVES: Not in place  HEALTH MAINTENANCE: Social History  Substance Use Topics  . Smoking status: Light Tobacco Smoker  . Smokeless tobacco: Never Used  . Alcohol use Yes     Comment: Rarely     Colonoscopy:  Not on file  PAP:  Not on file  Bone density:  Not on file  Lipid panel:  Not on file    Allergies  Allergen Reactions  . Tramadol Nausea Only  . Hydrocodone-Acetaminophen Itching and Rash    Current Outpatient Prescriptions  Medication Sig Dispense Refill  . alprazolam (XANAX) 2 MG tablet Take 2 mg by mouth daily.     . Ascorbic Acid (VITAMIN C) 100 MG tablet Take 100 mg by mouth daily.      . calcium-vitamin D (OSCAL WITH D)  500-200 MG-UNIT per tablet Take 1 tablet by mouth daily.      Marland Kitchen dextromethorphan-guaiFENesin (MUCINEX DM) 30-600 MG 12hr tablet Take 1 tablet by mouth 2 (two) times daily. 14 tablet 0  . gabapentin (NEURONTIN) 300 MG capsule Take 1 capsule (300 mg total) by mouth at bedtime. 90 capsule 4  . HYDROcodone-homatropine (HYCODAN) 5-1.5 MG/5ML syrup Take 5 mLs by mouth every 6 (six) hours as needed for cough. 120 mL 0  . Multiple Vitamin (MULTIVITAMIN) capsule Take 1 capsule by mouth daily.      . ondansetron (ZOFRAN) 8 MG tablet Take 1 tablet (8 mg total) by mouth 2 (two) times daily as needed (Nausea or vomiting). 30 tablet 1  . oxyCODONE (ROXICODONE) 15 MG immediate release tablet Take 2 tablets (30 mg total) by mouth every 3 (three) hours as needed for pain. 100 tablet 0  . oxyCODONE 30 MG 12 hr  tablet Take 60 mg by mouth 2 (two) times daily. 120 each 0  . predniSONE (DELTASONE) 5 MG tablet Take 2 tablets (10 mg total) by mouth daily with breakfast. 60 tablet 6  . prochlorperazine (COMPAZINE) 10 MG tablet Take 1 tablet (10 mg total) by mouth every 6 (six) hours as needed (Nausea or vomiting). 30 tablet 1   No current facility-administered medications for this visit.    Facility-Administered Medications Ordered in Other Visits  Medication Dose Route Frequency Provider Last Rate Last Dose  . sodium chloride 0.9 % injection 10 mL  10 mL Intravenous PRN Chauncey Cruel, MD   10 mL at 03/04/14 1421    OBJECTIVE: Middle-aged African American woman   Vitals:   06/15/16 1602  BP: 105/66  Pulse: 79  Resp: 18  Temp: 98.4 F (36.9 C)     Body mass index is 27.24 kg/m.    ECOG FS: 2 Filed Weights   06/15/16 1602  Weight: 163 lb 11.2 oz (74.3 kg)    Sclerae unicteric, pupils round and equal Oropharynx shows no thrush or other lesions Lungs no rales or rhonchi Heart regular rate and rhythm Abd soft, nontender, positive bowel sounds MSK chronic right shoulder and right upper extremity  lymphedema, with the upper arm and upper shoulder bandaged over Neuro: nonfocal, well oriented, appropriate affect Breasts: Deferred   LAB RESULTS:.  Lab Results  Component Value Date   WBC 5.8 06/15/2016   NEUTROABS 3.9 06/15/2016   HGB 10.8 (L) 06/15/2016   HCT 33.8 (L) 06/15/2016   MCV 80.9 06/15/2016   PLT 404 (H) 06/15/2016      Chemistry      Component Value Date/Time   NA 138 06/15/2016 1553   K 4.0 06/15/2016 1553   CL 100 08/14/2014 0100   CL 101 03/07/2013 1411   CO2 28 06/15/2016 1553   BUN 14.2 06/15/2016 1553   CREATININE 0.8 06/15/2016 1553      Component Value Date/Time   CALCIUM 9.6 06/15/2016 1553   ALKPHOS 98 06/15/2016 1553   AST 19 06/15/2016 1553   ALT 9 06/15/2016 1553   BILITOT <0.30 06/15/2016 1553       STUDIES: No results found.   ASSESSMENT:  61 y.o. [place] woman with stage IV breast cancer manifested chiefly by loco-regional nodal disease (neck, chest), without lung, liver, bone, or brain involvement.  (1) Status post right upper inner quadrant lumpectomy and sentinel lymph node sampling 03/04/2002 for 2 separate foci of invasive ductal carcinoma, mpT2 pN1 or stage IIB, both foci grade 3, both estrogen and progesterone receptor positive, both HercepTest 3+, Mib-1 56%  (2) Reexcision for margins 05/27/2002 showed no residual cancer in the breast.  (3) The patient refused adjuvant systemic therapy.  (4) Adjuvant radiation treatment completed 08/14/2002.  (5) recurrence in the right breast in 02/2004 showing a morphologically different tumor, again grade 3, again estrogen and progesterone receptor negative, with an MIB-1 of 14% and Herceptest 3+.  (5) Between 03/2004 and 07/2004 she received dose dense Doxorubicin/Cyclophosphamide x 4 given with trastuzumab, followed by weekly Docetaxel x 8, again given with trastuzumab.  (6) Right mastectomy 07/13/2004 showed scattered microscopic foci of residual disease over an area  greater than  5 cm. Margins were negative.  (7) Postoperative Docetaxel continued until 09/2004.  (8) Trastuzumab (Herceptin) given 08/2004 through 01/2012 with some brief interruptions.  (9) Isolated right cervical nodal recurrence 10/2005, treated with radiation to the right supraclavicular area (total 41.5 gray) completed  12/29/2005.  (10) Navelbine given together with Herceptin  11/2005 through 03/2006.  (9) Left mastectomy 02/13/2006 for ductal carcinoma in situ, grade 2, estrogen and progesterone receptor negative, with negative margins; 0 of 3 lymph nodes involved  (10) treated with Lapatinib and Capecitabine before 10/2009, for an unclear duration and with unclear results (cannot locate data on chart review).  (11) Status post right supraclavicular lymph node biopsy 09/2010 again positive for an invasive ductal carcinoma, estrogen and progesterone receptor negative, HER-2 positive by CISH with a ratio 4.25.  (12) Navelbine given together with Herceptin between 05/2011 and 11/2011.  (13) Carboplatin/ Gemcitabine/ Herceptin given for 2 cycles, in 12/2011 and 01/2012.  (14) TDM-1 ([place]) started 02/2012. Last dose 10/02/2013 after which the patient discontinued treatment at her own discretion. Echo on December 2014 showed a well preserved ejection fraction.  (15) Deep vein thrombosis of the right upper extremity documented 04/20/2011.  She completed anticoagulant therapy with Coumadin on 03/25/2013.  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 Left chest Port-A-Cath placed on 04/08/2013; being flushed every 6 weeks  RIGHT UPPER EXTREMITY ANGIOSARCOMA VS BREAST CANCER: (18) treated at [facility] of [place] August 2016 with paclitaxel, trastuzumab and  pertuzumab 1 cycle, afterwards referred to hospice  (19) under the care of hospice of [facility] August 2016 05/08/2016, when the patient opted for a second try at chemotherapy  (20) started low-dose Abraxane, trastuzumab and pertuzumab 06/07/2016, to be repeated every 3 weeks.   (a) pretreatment echocardiogram 06/06/2016 showed a 60-65% ejection fraction   PLAN: Harue had some problems with her first treatment, but she feels the arm is already a little bit better and she is willing to give it a couple more tries before making a definitive decision whether to continue or not.  She has very little appetite, and things taste terrible. She continues to lose weight. I think adding redness on 10 mg with breakfast may help with this.  There is no reason for her to be nauseated since she has Compazine and Zofran which she was not taking. I urged her to take dose as prescribed with the next cycle of chemotherapy.  She felt she was getting no benefit from the OxyContin so she stopped it. She increased her OxyIR to 45 mg which she is currently taking every 3 hours. She says that is keeping things under control.  Today we increased her OxyContin to 60 mg twice daily (two 30 milligram tablets) and she will continue the short acting OxyIR as needed. I refilled both those prescriptions for her. I asked her to call us within 48 hours to let us know if we need to make further adjustments before the weekend. We can certainly increase the OxyContin to 60 mg 3 times a day if that would work better for her.  She like the hospice nurses on the hospice system better but home health is learning how to work with her and they are not providing a similar dressing to her wound as she was having previously.   She is scheduled for her next cycle 06/28/2016. She will see me the same day. She knows to call for any problems that may develop before her next visit here.  Chauncey Cruel, MD  06/15/2016 5:33 PM

## 2016-06-20 ENCOUNTER — Telehealth: Payer: Self-pay | Admitting: *Deleted

## 2016-06-20 ENCOUNTER — Other Ambulatory Visit: Payer: Self-pay | Admitting: *Deleted

## 2016-06-20 ENCOUNTER — Other Ambulatory Visit: Payer: Self-pay

## 2016-06-20 ENCOUNTER — Other Ambulatory Visit: Payer: Self-pay | Admitting: Oncology

## 2016-06-20 DIAGNOSIS — C4911 Malignant neoplasm of connective and soft tissue of right upper limb, including shoulder: Secondary | ICD-10-CM

## 2016-06-20 DIAGNOSIS — C50211 Malignant neoplasm of upper-inner quadrant of right female breast: Secondary | ICD-10-CM

## 2016-06-20 MED ORDER — OXYMORPHONE HCL 10 MG PO TABS: 10.0000 mg | ORAL_TABLET | ORAL | 0 refills | Status: DC | PRN

## 2016-06-20 MED ORDER — OXYMORPHONE HCL ER 10 MG PO T12A: 10.0000 mg | EXTENDED_RELEASE_TABLET | Freq: Three times a day (TID) | ORAL | 0 refills | Status: DC

## 2016-06-20 NOTE — Telephone Encounter (Signed)
This RN spoke with pt per her call stating she would like to have Opana for pain not the oxycontin.  Dynver states she increased the dosing of oxycontin per MD visit last week " but then I started itching "  She has since been using the oxyir 15 mg with little relief.  Post verifying dosing including contacting pt's pharmacy -  Plan for pain is Opana 10 mg ER TID and oxir 15mg  for breakthrough pain.  She is to bring in her current bottle of oxycontin to obtain the Opana for safety concerns.  Injection nurse notified per prescription pick up for above exchange.

## 2016-06-20 NOTE — Telephone Encounter (Signed)
Chart reviewed.

## 2016-06-22 ENCOUNTER — Other Ambulatory Visit: Payer: Self-pay | Admitting: *Deleted

## 2016-06-22 DIAGNOSIS — T8130XS Disruption of wound, unspecified, sequela: Secondary | ICD-10-CM

## 2016-06-24 ENCOUNTER — Telehealth: Payer: Self-pay

## 2016-06-24 NOTE — Telephone Encounter (Signed)
Fax from [employer] [facility] for approval oxycodone. Decision Notes: Oxycodone 15 mg tab, use as directed, is approved for the cumulative morphine equivalent dose (MED) limit exception through 11/06/16 under your Medicare Part D benefit. Fax sent to be scanned.

## 2016-06-28 ENCOUNTER — Other Ambulatory Visit (HOSPITAL_BASED_OUTPATIENT_CLINIC_OR_DEPARTMENT_OTHER): Payer: Commercial Managed Care - PPO

## 2016-06-28 ENCOUNTER — Other Ambulatory Visit: Payer: Self-pay | Admitting: *Deleted

## 2016-06-28 ENCOUNTER — Ambulatory Visit: Payer: Commercial Managed Care - PPO

## 2016-06-28 ENCOUNTER — Other Ambulatory Visit: Payer: Self-pay | Admitting: Oncology

## 2016-06-28 ENCOUNTER — Ambulatory Visit (HOSPITAL_BASED_OUTPATIENT_CLINIC_OR_DEPARTMENT_OTHER): Payer: Commercial Managed Care - PPO

## 2016-06-28 VITALS — BP 99/55 | HR 54 | Temp 98.4°F | Resp 16

## 2016-06-28 DIAGNOSIS — Z95828 Presence of other vascular implants and grafts: Secondary | ICD-10-CM

## 2016-06-28 DIAGNOSIS — C778 Secondary and unspecified malignant neoplasm of lymph nodes of multiple regions: Secondary | ICD-10-CM

## 2016-06-28 DIAGNOSIS — C4911 Malignant neoplasm of connective and soft tissue of right upper limb, including shoulder: Secondary | ICD-10-CM | POA: Diagnosis not present

## 2016-06-28 DIAGNOSIS — Z5112 Encounter for antineoplastic immunotherapy: Secondary | ICD-10-CM | POA: Diagnosis not present

## 2016-06-28 DIAGNOSIS — Z5111 Encounter for antineoplastic chemotherapy: Secondary | ICD-10-CM | POA: Diagnosis not present

## 2016-06-28 DIAGNOSIS — C50211 Malignant neoplasm of upper-inner quadrant of right female breast: Secondary | ICD-10-CM | POA: Diagnosis not present

## 2016-06-28 LAB — COMPREHENSIVE METABOLIC PANEL
ALT: <9 U/L (ref 0–55)
AST: 22 U/L (ref 5–34)
Albumin: 3.1 g/dL — ABNORMAL LOW (ref 3.5–5.0)
Alkaline Phosphatase: 95 U/L (ref 40–150)
Anion Gap: 10 mEq/L (ref 3–11)
BUN: 14.2 mg/dL (ref 7.0–26.0)
CO2: 30 mEq/L — ABNORMAL HIGH (ref 22–29)
Calcium: 9.7 mg/dL (ref 8.4–10.4)
Chloride: 101 mEq/L (ref 98–109)
Creatinine: 0.9 mg/dL (ref 0.6–1.1)
EGFR: 81 mL/min/{1.73_m2} — ABNORMAL LOW (ref >90)
Glucose: 97 mg/dl (ref 70–140)
Potassium: 3.5 mEq/L (ref 3.5–5.1)
Sodium: 141 mEq/L (ref 136–145)
Total Bilirubin: <0.3 mg/dL (ref 0.20–1.20)
Total Protein: 7.3 g/dL (ref 6.4–8.3)

## 2016-06-28 LAB — CBC WITH DIFFERENTIAL/PLATELET
BASO%: 1.1 % (ref 0.0–2.0)
Basophils Absolute: 0.1 10*3/uL (ref 0.0–0.1)
EOS%: 3.6 % (ref 0.0–7.0)
Eosinophils Absolute: 0.2 10*3/uL (ref 0.0–0.5)
HCT: 37.5 % (ref 34.8–46.6)
HGB: 12.1 g/dL (ref 11.6–15.9)
LYMPH%: 27.1 % (ref 14.0–49.7)
MCH: 26.3 pg (ref 25.1–34.0)
MCHC: 32.4 g/dL (ref 31.5–36.0)
MCV: 81.3 fL (ref 79.5–101.0)
MONO#: 0.8 10*3/uL (ref 0.1–0.9)
MONO%: 12.2 % (ref 0.0–14.0)
NEUT#: 3.7 10*3/uL (ref 1.5–6.5)
NEUT%: 56 % (ref 38.4–76.8)
Platelets: 264 10*3/uL (ref 145–400)
RBC: 4.61 10*6/uL (ref 3.70–5.45)
RDW: 15.1 % — ABNORMAL HIGH (ref 11.2–14.5)
WBC: 6.6 10*3/uL (ref 3.9–10.3)
lymph#: 1.8 10*3/uL (ref 0.9–3.3)

## 2016-06-28 MED ORDER — SODIUM CHLORIDE 0.9 % IJ SOLN
10.0000 mL | INTRAMUSCULAR | Status: DC | PRN
Start: 2016-06-28 — End: 2016-06-28
  Administered 2016-06-28 (×2): 10 mL via INTRAVENOUS
  Filled 2016-06-28: qty 10

## 2016-06-28 MED ORDER — SODIUM CHLORIDE 0.9 % IV SOLN
6.0000 mg/kg | Freq: Once | INTRAVENOUS | Status: AC
  Administered 2016-06-28: 483 mg via INTRAVENOUS
  Filled 2016-06-28: qty 23

## 2016-06-28 MED ORDER — SODIUM CHLORIDE 0.9 % IV SOLN
420.0000 mg | Freq: Once | INTRAVENOUS | Status: AC
  Administered 2016-06-28: 420 mg via INTRAVENOUS
  Filled 2016-06-28: qty 14

## 2016-06-28 MED ORDER — DIPHENHYDRAMINE HCL 25 MG PO CAPS
25.0000 mg | ORAL_CAPSULE | Freq: Once | ORAL | Status: AC
  Administered 2016-06-28: 25 mg via ORAL

## 2016-06-28 MED ORDER — ACETAMINOPHEN 325 MG PO TABS
650.0000 mg | ORAL_TABLET | Freq: Once | ORAL | Status: AC
  Administered 2016-06-28: 650 mg via ORAL

## 2016-06-28 MED ORDER — OXYCODONE HCL 15 MG PO TABS: 30.0000 mg | ORAL_TABLET | ORAL | 0 refills | Status: DC | PRN

## 2016-06-28 MED ORDER — DIPHENHYDRAMINE HCL 25 MG PO CAPS
ORAL_CAPSULE | ORAL | Status: AC
  Filled 2016-06-28: qty 1

## 2016-06-28 MED ORDER — SODIUM CHLORIDE 0.9% FLUSH
10.0000 mL | INTRAVENOUS | Status: DC | PRN
  Filled 2016-06-28: qty 10

## 2016-06-28 MED ORDER — PACLITAXEL PROTEIN-BOUND CHEMO INJECTION 100 MG
125.0000 mg/m2 | Freq: Once | INTRAVENOUS | Status: AC
  Administered 2016-06-28: 250 mg via INTRAVENOUS
  Filled 2016-06-28: qty 50

## 2016-06-28 MED ORDER — SODIUM CHLORIDE 0.9 % IV SOLN
Freq: Once | INTRAVENOUS | Status: AC
Start: 2016-06-28 — End: 2016-06-28
  Administered 2016-06-28: 11:00:00 via INTRAVENOUS

## 2016-06-28 MED ORDER — PROCHLORPERAZINE MALEATE 10 MG PO TABS
10.0000 mg | ORAL_TABLET | Freq: Once | ORAL | Status: AC
  Administered 2016-06-28: 10 mg via ORAL

## 2016-06-28 MED ORDER — HEPARIN SOD (PORK) LOCK FLUSH 100 UNIT/ML IV SOLN
500.0000 [IU] | Freq: Once | INTRAVENOUS | Status: AC | PRN
  Administered 2016-06-28: 500 [IU]
  Filled 2016-06-28: qty 5

## 2016-06-28 MED ORDER — FENTANYL 75 MCG/HR TD PT72: 75.0000 ug | MEDICATED_PATCH | TRANSDERMAL | 0 refills | Status: DC

## 2016-06-28 MED ORDER — PROCHLORPERAZINE MALEATE 10 MG PO TABS
ORAL_TABLET | ORAL | Status: AC
  Filled 2016-06-28: qty 1

## 2016-06-28 MED ORDER — ACETAMINOPHEN 325 MG PO TABS
ORAL_TABLET | ORAL | Status: AC
  Filled 2016-06-28: qty 2

## 2016-06-28 NOTE — Progress Notes (Signed)
VO given and read back - ok to treat today with CMET results from 13 days ago- Dr. Jacinto Halim RN

## 2016-06-28 NOTE — Patient Instructions (Addendum)
[facility] Discharge Instructions for Patients Receiving Chemotherapy  Today you received the following chemotherapy agents:  Herceptin, perjeta,Abraxane  To help prevent nausea and vomiting after your treatment, we encourage you to take your nausea medication.   If you develop nausea and vomiting that is not controlled by your nausea medication, call the clinic.   BELOW ARE SYMPTOMS THAT SHOULD BE REPORTED IMMEDIATELY:  *FEVER GREATER THAN 100.5 F  *CHILLS WITH OR WITHOUT FEVER  NAUSEA AND VOMITING THAT IS NOT CONTROLLED WITH YOUR NAUSEA MEDICATION  *UNUSUAL SHORTNESS OF BREATH  *UNUSUAL BRUISING OR BLEEDING  TENDERNESS IN MOUTH AND THROAT WITH OR WITHOUT PRESENCE OF ULCERS  *URINARY PROBLEMS  *BOWEL PROBLEMS  UNUSUAL RASH Items with * indicate a potential emergency and should be followed up as soon as possible.  Feel free to call the clinic you have any questions or concerns. The clinic phone number is [phone].  Please show the [place] [facility] at check-in to the Emergency Department and triage nurse.  Nanoparticle Albumin-Bound Paclitaxel injection What is this medicine? NANOPARTICLE ALBUMIN-BOUND PACLITAXEL (Na no PAHR ti kuhl al BYOO muhn-bound PAK li TAX el) is a chemotherapy drug. It targets fast dividing cells, like cancer cells, and causes these cells to die. This medicine is used to treat advanced breast cancer and advanced lung cancer. This medicine may be used for other purposes; ask your health care provider or pharmacist if you have questions. What should I tell my health care provider before I take this medicine? They need to know if you have any of these conditions: -kidney disease -liver disease -low blood counts, like low platelets, red blood cells, or white blood cells -recent or ongoing radiation therapy -an unusual or allergic reaction to paclitaxel, albumin, other chemotherapy, other medicines, foods, dyes, or  preservatives -pregnant or trying to get pregnant -breast-feeding How should I use this medicine? This drug is given as an infusion into a vein. It is administered in a hospital or clinic by a specially trained health care professional. Talk to your pediatrician regarding the use of this medicine in children. Special care may be needed. Overdosage: If you think you have taken too much of this medicine contact a poison control center or emergency room at once. NOTE: This medicine is only for you. Do not share this medicine with others. What if I miss a dose? It is important not to miss your dose. Call your doctor or health care professional if you are unable to keep an appointment. What may interact with this medicine? -cyclosporine -diazepam -ketoconazole -medicines to increase blood counts like filgrastim, pegfilgrastim, sargramostim -other chemotherapy drugs like cisplatin, doxorubicin, epirubicin, etoposide, teniposide, vincristine -quinidine -testosterone -vaccines -verapamil Talk to your doctor or health care professional before taking any of these medicines: -acetaminophen -aspirin -ibuprofen -ketoprofen -naproxen This list may not describe all possible interactions. Give your health care provider a list of all the medicines, herbs, non-prescription drugs, or dietary supplements you use. Also tell them if you smoke, drink alcohol, or use illegal drugs. Some items may interact with your medicine. What should I watch for while using this medicine? Your condition will be monitored carefully while you are receiving this medicine. You will need important blood work done while you are taking this medicine. This drug may make you feel generally unwell. This is not uncommon, as chemotherapy can affect healthy cells as well as cancer cells. Report any side effects. Continue your course of treatment even though you feel ill  unless your doctor tells you to stop. In some cases, you may be  given additional medicines to help with side effects. Follow all directions for their use. Call your doctor or health care professional for advice if you get a fever, chills or sore throat, or other symptoms of a cold or flu. Do not treat yourself. This drug decreases your body's ability to fight infections. Try to avoid being around people who are sick. This medicine may increase your risk to bruise or bleed. Call your doctor or health care professional if you notice any unusual bleeding. Be careful brushing and flossing your teeth or using a toothpick because you may get an infection or bleed more easily. If you have any dental work done, tell your dentist you are receiving this medicine. Avoid taking products that contain aspirin, acetaminophen, ibuprofen, naproxen, or ketoprofen unless instructed by your doctor. These medicines may hide a fever. Do not become pregnant while taking this medicine. Women should inform their doctor if they wish to become pregnant or think they might be pregnant. There is a potential for serious side effects to an unborn child. Talk to your health care professional or pharmacist for more information. Do not breast-feed an infant while taking this medicine. Men are advised not to father a child while receiving this medicine. What side effects may I notice from receiving this medicine? Side effects that you should report to your doctor or health care professional as soon as possible: -allergic reactions like skin rash, itching or hives, swelling of the face, lips, or tongue -low blood counts - This drug may decrease the number of white blood cells, red blood cells and platelets. You may be at increased risk for infections and bleeding. -signs of infection - fever or chills, cough, sore throat, pain or difficulty passing urine -signs of decreased platelets or bleeding - bruising, pinpoint red spots on the skin, black, tarry stools, nosebleeds -signs of decreased red blood  cells - unusually weak or tired, fainting spells, lightheadedness -breathing problems -changes in vision -chest pain -high or low blood pressure -mouth sores -nausea and vomiting -pain, swelling, redness or irritation at the injection site -pain, tingling, numbness in the hands or feet -slow or irregular heartbeat -swelling of the ankle, feet, hands Side effects that usually do not require medical attention (report to your doctor or health care professional if they continue or are bothersome): -aches, pains -changes in the color of fingernails -diarrhea -hair loss -loss of appetite This list may not describe all possible side effects. Call your doctor for medical advice about side effects. You may report side effects to FDA at 1-800-FDA-1088. Where should I keep my medicine? This drug is given in a hospital or clinic and will not be stored at home. NOTE: This sheet is a summary. It may not cover all possible information. If you have questions about this medicine, talk to your doctor, pharmacist, or health care provider.    2016, Elsevier/[employer] Standard. (2012-12-17 16:48:50)

## 2016-06-30 ENCOUNTER — Encounter: Payer: Commercial Managed Care - PPO | Attending: Surgery | Admitting: Surgery

## 2016-06-30 ENCOUNTER — Telehealth: Payer: Self-pay | Admitting: *Deleted

## 2016-06-30 DIAGNOSIS — Z853 Personal history of malignant neoplasm of breast: Secondary | ICD-10-CM | POA: Insufficient documentation

## 2016-06-30 DIAGNOSIS — S41101A Unspecified open wound of right upper arm, initial encounter: Secondary | ICD-10-CM | POA: Diagnosis not present

## 2016-06-30 DIAGNOSIS — S2190XA Unspecified open wound of unspecified part of thorax, initial encounter: Secondary | ICD-10-CM | POA: Diagnosis not present

## 2016-06-30 DIAGNOSIS — F17210 Nicotine dependence, cigarettes, uncomplicated: Secondary | ICD-10-CM | POA: Insufficient documentation

## 2016-06-30 DIAGNOSIS — C4911 Malignant neoplasm of connective and soft tissue of right upper limb, including shoulder: Secondary | ICD-10-CM | POA: Diagnosis not present

## 2016-06-30 DIAGNOSIS — X58XXXA Exposure to other specified factors, initial encounter: Secondary | ICD-10-CM | POA: Insufficient documentation

## 2016-06-30 DIAGNOSIS — I972 Postmastectomy lymphedema syndrome: Secondary | ICD-10-CM | POA: Insufficient documentation

## 2016-06-30 MED ORDER — DIPHENOXYLATE-ATROPINE 2.5-0.025 MG PO TABS: 1.0000 | ORAL_TABLET | Freq: Four times a day (QID) | ORAL | 1 refills | Status: DC | PRN

## 2016-06-30 NOTE — Telephone Encounter (Signed)
This RN returned pt's call per VM stating " I got the runs and do not have any medication for it "  Per call- Shasha states she " was up until 4 am with them and then I ran late for my appointment with the wound clinic because I had the runs again "  " You guys keep calling in nausea medication but I do not have anything for the runs "  Per call Ettamae stated as well " my sister was visiting me and she has had cancer too and I took her to the train stating this morning and then found out she accidentally took my oxy IR with her "  "I got the patches but now do not have the pills - it was an accident and I just some pills for my break thru pain "  This RN informed pt per her VM - lomotil has been called in.  Informed her above issue with pain pills will have to be reviewed further due to regulations by the [facility] and dispensing concerns.

## 2016-07-01 NOTE — Progress Notes (Signed)
VIRLA, CORDERY ([id number]) Visit Report for 06/30/2016 Allergy List Details Patient Name: [name], [name] Date of Service: 06/30/2016 12:45 PM Medical Record Number: [id number] Patient Account Number: [id number] Date of Birth/Sex: May 25, 1956 (60 y.o. Female) Treating RN: Ahmed Prima Primary Care Physician: [name] Other Clinician: Referring Physician: Lurline Del Treating Physician/Extender: Frann Rider in Treatment: 0 Allergies Active Allergies No Known Allergies Allergy Notes Electronic Signature(s) Signed: 06/30/2016 5:32:44 PM By: Alric Quan Entered By: Alric Quan on 06/30/2016 13:12:18 [name] ([id number]) -------------------------------------------------------------------------------- Arrival Information Details Patient Name: [name] Date of Service: 06/30/2016 12:45 PM Medical Record Number: [id number] Patient Account Number: [id number] Date of Birth/Sex: Nov 25, 1955 (60 y.o. Female) Treating RN: Ahmed Prima Primary Care Physician: [name] Other Clinician: Referring Physician: Lurline Del Treating Physician/Extender: Frann Rider in Treatment: 0 Visit Information Patient Arrived: Ambulatory Arrival Time: 13:07 Accompanied By: caregiver Transfer Assistance: None Patient Identification Verified: Yes Secondary Verification Process Yes Completed: Patient Requires Transmission- No Based Precautions: Patient Has Alerts: Yes Patient Alerts: NO BP in RIGHT ARM History Since Last Visit All ordered tests and consults were completed: No Added or deleted any medications: Yes Any new allergies or adverse reactions: No Had a fall or experienced change in activities of daily living that may affect risk of falls: No Signs or symptoms of abuse/neglect since last visito No Hospitalized since last visit: No Electronic Signature(s) Signed: 06/30/2016 5:32:44 PM By: Alric Quan Entered By: Alric Quan on  06/30/2016 13:10:48 [name] ([id number]) -------------------------------------------------------------------------------- Clinic Level of Care Assessment Details Patient Name: [name] Date of Service: 06/30/2016 12:45 PM Medical Record Number: [id number] Patient Account Number: [id number] Date of Birth/Sex: Aug 26, 1956 (60 y.o. Female) Treating RN: Afful, RN, BSN, Velva Harman Primary Care Physician: [name] Other Clinician: Referring Physician: Lurline Del Treating Physician/Extender: Frann Rider in Treatment: 0 Clinic Level of Care Assessment Items TOOL 2 Quantity Score []  - Use when only an EandM is performed on the INITIAL visit 0 ASSESSMENTS - Nursing Assessment / Reassessment X - General Physical Exam (combine w/ comprehensive assessment (listed just 1 20 below) when performed on new pt. evals) X - Comprehensive Assessment (HX, ROS, Risk Assessments, Wounds Hx, etc.) 1 25 ASSESSMENTS - Wound and Skin Assessment / Reassessment []  - Simple Wound Assessment / Reassessment - one wound 0 X - Complex Wound Assessment / Reassessment - multiple wounds 2 5 []  - Dermatologic / Skin Assessment (not related to wound area) 0 ASSESSMENTS - Ostomy and/or Continence Assessment and Care []  - Incontinence Assessment and Management 0 []  - Ostomy Care Assessment and Management (repouching, etc.) 0 PROCESS - Coordination of Care X - Simple Patient / Family Education for ongoing care 1 15 []  - Complex (extensive) Patient / Family Education for ongoing care 0 []  - Staff obtains Programmer, systems, Records, Test Results / Process Orders 0 []  - Staff telephones HHA, Nursing Homes / Clarify orders / etc 0 []  - Routine Transfer to another Facility (non-emergent condition) 0 []  - Routine Hospital Admission (non-emergent condition) 0 X - New Admissions / Biomedical engineer / Ordering NPWT, Apligraf, etc. 1 15 []  - Emergency Hospital Admission (emergent condition) 0 []  - Simple  Discharge Coordination 0 No, [place]anni ([id number]) []  - Complex (extensive) Discharge Coordination 0 PROCESS - Special Needs []  - Pediatric / Minor Patient Management 0 []  - Isolation Patient Management 0 []  - Hearing / Language / Visual special needs 0 []  - Assessment of Community assistance (transportation, D/C planning, etc.) 0 []  - Additional assistance / Altered mentation 0 []  -  Support Surface(s) Assessment (bed, cushion, seat, etc.) 0 INTERVENTIONS - Wound Cleansing / Measurement X - Wound Imaging (photographs - any number of wounds) 1 5 []  - Wound Tracing (instead of photographs) 0 []  - Simple Wound Measurement - one wound 0 X - Complex Wound Measurement - multiple wounds 2 5 []  - Simple Wound Cleansing - one wound 0 X - Complex Wound Cleansing - multiple wounds 2 5 INTERVENTIONS - Wound Dressings []  - Small Wound Dressing one or multiple wounds 0 X - Medium Wound Dressing one or multiple wounds 2 15 []  - Large Wound Dressing one or multiple wounds 0 []  - Application of Medications - injection 0 INTERVENTIONS - Miscellaneous []  - External ear exam 0 []  - Specimen Collection (cultures, biopsies, blood, body fluids, etc.) 0 []  - Specimen(s) / Culture(s) sent or taken to Lab for analysis 0 []  - Patient Transfer (multiple staff / Civil Service fast streamer / Similar devices) 0 []  - Simple Staple / Suture removal (25 or less) 0 []  - Complex Staple / Suture removal (26 or more) 0 Bry, Jennifer ([id number]) []  - Hypo / Hyperglycemic Management (close monitor of Blood Glucose) 0 []  - Ankle / Brachial Index (ABI) - do not check if billed separately 0 Has the patient been seen at the hospital within the last three years: Yes Total Score: 140 Level Of Care: New/Established - Level 4 Electronic Signature(s) Signed: 06/30/2016 5:25:53 PM By: Regan Lemming BSN, RN Entered By: Regan Lemming on 06/30/2016 17:06:33 [name]  ([id number]) -------------------------------------------------------------------------------- Encounter Discharge Information Details Patient Name: [name] Date of Service: 06/30/2016 12:45 PM Medical Record Number: [id number] Patient Account Number: [id number] Date of Birth/Sex: Aug 24, 1956 (60 y.o. Female) Treating RN: Carolyne Fiscal, Debi Primary Care Physician: [name] Other Clinician: Referring Physician: Lurline Del Treating Physician/Extender: Frann Rider in Treatment: 0 Encounter Discharge Information Items Discharge Pain Level: 0 Discharge Condition: Stable Ambulatory Status: Ambulatory Discharge Destination: Home Transportation: Private Auto Accompanied By: caregiver Schedule Follow-up Appointment: Yes Medication Reconciliation completed and provided to Patient/Care No Keshon Markovitz: Provided on Clinical Summary of Care: 06/30/2016 Form Type Recipient Paper Patient MF Electronic Signature(s) Signed: 06/30/2016 2:17:53 PM By: Ruthine Dose Entered By: Ruthine Dose on 06/30/2016 14:17:53 [name] ([id number]) -------------------------------------------------------------------------------- Lower Extremity Assessment Details Patient Name: [name] Date of Service: 06/30/2016 12:45 PM Medical Record Number: [id number] Patient Account Number: [id number] Date of Birth/Sex: Apr 02, 1956 (60 y.o. Female) Treating RN: Ahmed Prima Primary Care Physician: [name] Other Clinician: Referring Physician: Lurline Del Treating Physician/Extender: Frann Rider in Treatment: 0 Electronic Signature(s) Signed: 06/30/2016 5:32:44 PM By: Alric Quan Entered By: Alric Quan on 06/30/2016 13:11:36 [name] ([id number]) -------------------------------------------------------------------------------- Multi Wound Chart Details Patient Name: [name] Date of Service: 06/30/2016 12:45 PM Medical Record Number:  [id number] Patient Account Number: [id number] Date of Birth/Sex: 1956-08-22 (60 y.o. Female) Treating RN: Baruch Gouty, RN, BSN, Velva Harman Primary Care Physician: [name] Other Clinician: Referring Physician: Lurline Del Treating Physician/Extender: Frann Rider in Treatment: 0 Vital Signs Height(in): 65 Pulse(bpm): 81 Weight(lbs): 159.2 Blood Pressure 127/63 (mmHg): Body Mass Index(BMI): 26 Temperature(F): 98.0 Respiratory Rate 18 (breaths/min): Photos: [2:No Photos] [3:No Photos] [N/A:N/A] Wound Location: [2:Right Upper Arm - Circumfernential] [3:Right Back - Proximal] [N/A:N/A] Wounding Event: [2:Gradually Appeared] [3:Gradually Appeared] [N/A:N/A] Primary Etiology: [2:To be determined] [3:To be determined] [N/A:N/A] Comorbid History: [2:Deep Vein Thrombosis, Hypertension, Received Chemotherapy, Received Radiation] [3:Deep Vein Thrombosis, Hypertension, Received Chemotherapy, Received Radiation] [N/A:N/A] Date Acquired: [2:01/01/2016] [3:03/30/2016] [N/A:N/A] Weeks of Treatment: [2:0] [3:0] [N/A:N/A] Wound Status: [2:Open] [3:Open] [N/A:N/A] Measurements  L x W x D 14.5x23.5x0.1 [3:9x14x0.2] [N/A:N/A] (cm) Area (cm) : [2:267.624] [3:98.96] [N/A:N/A] Volume (cm) : [2:26.762] [3:19.792] [N/A:N/A] % Reduction in Area: [2:-80.80%] [3:N/A] [N/A:N/A] % Reduction in Volume: -80.80% [3:N/A] [N/A:N/A] Classification: [2:Partial Thickness] [3:Partial Thickness] [N/A:N/A] Exudate Amount: [2:Large] [3:Large] [N/A:N/A] Exudate Type: [2:Serosanguineous] [3:Serosanguineous] [N/A:N/A] Exudate Color: [2:red, brown] [3:red, brown] [N/A:N/A] Wound Margin: [2:Distinct, outline attached] [3:Distinct, outline attached] [N/A:N/A] Granulation Amount: [2:Large (67-100%)] [3:None Present (0%)] [N/A:N/A] Granulation Quality: [2:Red] [3:N/A] [N/A:N/A] Necrotic Amount: [2:Small (1-33%)] [3:Large (67-100%)] [N/A:N/A] Exposed Structures: [2:Fascia: No Fat: No Tendon: No Muscle: No]  [3:Fascia: No Fat: No Tendon: No Muscle: No] [N/A:N/A] Joint: No Joint: No Bone: No Bone: No Limited to Skin Limited to Skin Breakdown Breakdown Epithelialization: None None N/A Periwound Skin Texture: Edema: Yes No Abnormalities Noted N/A Periwound Skin Moist: Yes No Abnormalities Noted N/A Moisture: Periwound Skin Color: No Abnormalities Noted No Abnormalities Noted N/A Temperature: No Abnormality No Abnormality N/A Tenderness on Yes Yes N/A Palpation: Wound Preparation: Ulcer Cleansing: Ulcer Cleansing: N/A Rinsed/Irrigated with Rinsed/Irrigated with Saline Saline Topical Anesthetic Topical Anesthetic Applied: Other: lidocaine Applied: Other: lidocaine 4% 4% Treatment Notes Electronic Signature(s) Signed: 06/30/2016 5:25:53 PM By: Regan Lemming BSN, RN Entered By: Regan Lemming on 06/30/2016 13:54:46 [name] ([id number]) -------------------------------------------------------------------------------- [place] Details Patient Name: [name] Date of Service: 06/30/2016 12:45 PM Medical Record Number: [id number] Patient Account Number: [id number] Date of Birth/Sex: [birth year] (60 y.o. Female) Treating RN: Baruch Gouty, RN, BSN, Velva Harman Primary Care Physician: [name] Other Clinician: Referring Physician: Lurline Del Treating Physician/Extender: Frann Rider in Treatment: 0 Active Inactive Electronic Signature(s) Signed: 06/30/2016 5:25:53 PM By: Regan Lemming BSN, RN Entered By: Regan Lemming on 06/30/2016 13:54:15 [name] ([id number]) -------------------------------------------------------------------------------- Pain Assessment Details Patient Name: [name] Date of Service: 06/30/2016 12:45 PM Medical Record Number: [id number] Patient Account Number: [id number] Date of Birth/Sex: 1956-07-10 (60 y.o. Female) Treating RN: Carolyne Fiscal, Debi Primary Care Physician: [name] Other Clinician: Referring Physician: Lurline Del Treating Physician/Extender: Frann Rider in Treatment: 0 Active Problems Location of Pain Severity and Description of Pain Patient Has Paino No Site Locations With Dressing Change: No Pain Management and Medication Current Pain Management: Electronic Signature(s) Signed: 06/30/2016 5:32:44 PM By: Alric Quan Entered By: Alric Quan on 06/30/2016 13:10:57 [name] ([id number]) -------------------------------------------------------------------------------- Patient/Caregiver Education Details Patient Name: [name] Date of Service: 06/30/2016 12:45 PM Medical Record Number: [id number] Patient Account Number: [id number] Date of Birth/Gender: 1956-07-27 (60 y.o. Female) Treating RN: Carolyne Fiscal, Debi Primary Care Physician: [name] Other Clinician: Referring Physician: Lurline Del Treating Physician/Extender: Frann Rider in Treatment: 0 Education Assessment Education Provided To: Patient and Caregiver Education Topics Provided Welcome To [facility]: Handouts: Welcome To [facility] Methods: Explain/Verbal Wound/Skin Impairment: Handouts: Other: change dressing as ordered Methods: Demonstration, Explain/Verbal Responses: State content correctly Electronic Signature(s) Signed: 06/30/2016 5:32:44 PM By: Alric Quan Entered By: Alric Quan on 06/30/2016 13:49:18 [name] ([id number]) -------------------------------------------------------------------------------- Wound Assessment Details Patient Name: [name] Date of Service: 06/30/2016 12:45 PM Medical Record Number: [id number] Patient Account Number: [id number] Date of Birth/Sex: [birth year] (60 y.o. Female) Treating RN: Ahmed Prima Primary Care Physician: [name] Other Clinician: Referring Physician: Lurline Del Treating Physician/Extender: Frann Rider in Treatment: 0 Wound Status Wound Number: 2 Primary  To be determined Etiology: Wound Location: Right Upper Arm - Circumfernential Wound Open Status: Wounding Event: Gradually Appeared Comorbid Deep Vein Thrombosis, Hypertension, Date Acquired: 01/01/2016 History: Received Chemotherapy, Received Weeks Of Treatment: 0 Radiation Clustered Wound:  No Photos Photo Uploaded By: Alric Quan on 06/30/2016 15:55:52 Wound Measurements Length: (cm) 14.5 Width: (cm) 23.5 Depth: (cm) 0.1 Area: (cm) 267.624 Volume: (cm) 26.762 % Reduction in Area: -80.8% % Reduction in Volume: -80.8% Epithelialization: None Tunneling: No Undermining: No Wound Description Classification: Partial Thickness Wound Margin: Distinct, outline attached Exudate Amount: Large Exudate Type: Serosanguineous Exudate Color: red, brown Foul Odor After Cleansing: No Wound Bed Granulation Amount: Large (67-100%) Exposed Structure Granulation Quality: Red Fascia Exposed: No Necrotic Amount: Small (1-33%) Fat Layer Exposed: No Knupp, Shoshanna ([id number]) Necrotic Quality: Adherent Slough Tendon Exposed: No Muscle Exposed: No Joint Exposed: No Bone Exposed: No Limited to Skin Breakdown Periwound Skin Texture Texture Color No Abnormalities Noted: No No Abnormalities Noted: No Localized Edema: Yes Temperature / Pain Moisture Temperature: No Abnormality No Abnormalities Noted: No Tenderness on Palpation: Yes Moist: Yes Wound Preparation Ulcer Cleansing: Rinsed/Irrigated with Saline Topical Anesthetic Applied: Other: lidocaine 4%, Treatment Notes Wound #2 (Right, Circumferential Upper Arm) 1. Cleansed with: Clean wound with Normal Saline 2. Anesthetic Topical Lidocaine 4% cream to wound bed prior to debridement 4. Dressing Applied: Aquacel Ag 5. Secondary Dressing Applied ABD Pad 7. Secured with Tape Notes netting Electronic Signature(s) Signed: 06/30/2016 5:32:44 PM By: Alric Quan Entered By: Alric Quan on 06/30/2016 13:41:41 [name] ([id number]) -------------------------------------------------------------------------------- Wound Assessment Details Patient Name: [name] Date of Service: 06/30/2016 12:45 PM Medical Record Number: [id number] Patient Account Number: [id number] Date of Birth/Sex: [birth year] (60 y.o. Female) Treating RN: Carolyne Fiscal, Debi Primary Care Physician: [name] Other Clinician: Referring Physician: Lurline Del Treating Physician/Extender: Frann Rider in Treatment: 0 Wound Status Wound Number: 3 Primary To be determined Etiology: Wound Location: Right Back - Proximal Wound Open Wounding Event: Gradually Appeared Status: Date Acquired: 03/30/2016 Comorbid Deep Vein Thrombosis, Hypertension, Weeks Of Treatment: 0 History: Received Chemotherapy, Received Clustered Wound: No Radiation Photos Photo Uploaded By: Alric Quan on 06/30/2016 15:55:53 Wound Measurements Length: (cm) 9 Width: (cm) 14 Depth: (cm) 0.2 Area: (cm) 98.96 Volume: (cm) 19.792 % Reduction in Area: % Reduction in Volume: Epithelialization: None Tunneling: No Undermining: No Wound Description Classification: Partial Thickness Wound Margin: Distinct, outline attached Exudate Amount: Large Exudate Type: Serosanguineous Exudate Color: red, brown Foul Odor After Cleansing: No Wound Bed Granulation Amount: None Present (0%) Exposed Structure Necrotic Amount: Large (67-100%) Fascia Exposed: No Necrotic Quality: Adherent Slough Fat Layer Exposed: No Tendon Exposed: No Jilek, Jeanice ([id number]) Muscle Exposed: No Joint Exposed: No Bone Exposed: No Limited to Skin Breakdown Periwound Skin Texture Texture Color No Abnormalities Noted: No No Abnormalities Noted: No Moisture Temperature / Pain No Abnormalities Noted: No Temperature: No Abnormality Tenderness on Palpation: Yes Wound Preparation Ulcer Cleansing: Rinsed/Irrigated with Saline Topical Anesthetic  Applied: Other: lidocaine 4%, Treatment Notes Wound #3 (Right, Proximal Back) 1. Cleansed with: Clean wound with Normal Saline 2. Anesthetic Topical Lidocaine 4% cream to wound bed prior to debridement 4. Dressing Applied: Aquacel Ag 5. Secondary Dressing Applied ABD Pad 7. Secured with Recruitment consultant) Signed: 06/30/2016 5:32:44 PM By: Alric Quan Entered By: Alric Quan on 06/30/2016 13:36:43 [name] ([id number]) -------------------------------------------------------------------------------- Vitals Details Patient Name: [name] Date of Service: 06/30/2016 12:45 PM Medical Record Number: [id number] Patient Account Number: [id number] Date of Birth/Sex: [birth year] (60 y.o. Female) Treating RN: Carolyne Fiscal, Debi Primary Care Physician: [name] Other Clinician: Referring Physician: Lurline Del Treating Physician/Extender: Frann Rider in Treatment: 0 Vital Signs Time Taken: 13:11 Temperature (F): 98.0 Height (in): 65 Pulse (bpm): 81 Source: Stated Respiratory Rate (breaths/min):  18 Weight (lbs): 159.2 Blood Pressure (mmHg): 127/63 Source: Measured Reference Range: 80 - 120 mg / dl Body Mass Index (BMI): 26.5 Electronic Signature(s) Signed: 06/30/2016 5:32:44 PM By: Alric Quan Entered By: Alric Quan on 06/30/2016 13:11:30

## 2016-07-01 NOTE — Progress Notes (Signed)
[name], [name] ([id number]) Visit Report for 06/30/2016 Chief Complaint Document Details Patient Name: [name], [name] 06/30/2016 12:45 Date of Service: PM Medical Record [id number] Number: Patient Account Number: [id number] 18-Sep-1956 (60 y.o. Treating RN: Ahmed Prima Date of Birth/Sex: Female) Other Clinician: Primary Care Physician: [name] Treating [name] Referring Physician: Lurline Del Physician/Extender: Suella Grove in Treatment: 0 Information Obtained from: Patient Chief Complaint Patient presents to the wound care center for a consult due non healing wound. The patient was known to have bilateral lymphedema as a status post mastectomy syndrome now comes with an ulcerated area of her right upper arm and back which she's had for several weeks. Electronic Signature(s) Signed: 06/30/2016 2:07:23 PM By: [name], FACS Entered By: [name] on 06/30/2016 14:07:23 [name] ([id number]) -------------------------------------------------------------------------------- HPI Details Patient Name: [name] 06/30/2016 12:45 Date of Service: PM Medical Record [id number] Number: Patient Account Number: [id number] 1956-08-09 (60 y.o. Treating RN: Ahmed Prima Date of Birth/Sex: Female) Other Clinician: Primary Care Physician: [name] Treating [name] Referring Physician: Lurline Del Physician/Extender: Weeks in Treatment: 0 History of Present Illness Location: bilateral arm lymphedema with now skin ulceration of the right upper extremity. Quality: Patient reports experiencing a dull pain to affected area(s). Severity: Patient states wound are getting worse. Duration: Patient has had the wound for > 18 months prior to seeking treatment at the wound center Timing: Pain in wound is Intermittent (comes and goes Context: The wound occurred when the patiento. Modifying Factors: Consults to this date include: local care and  antibiotics as prescribed by the PCP and the ER. Associated Signs and Symptoms: Patient reports having increase discharge. HPI Description: 60 year old patient known to [name] from a prior visit about a year ago, returns for care. she is known to have stage IV breast cancer, angiosarcoma and has been on treatment with the medical oncologist Dr. Jana Hakim. She has been seeing the hospice nurses earlier and has been continuing with chemotherapy. She was known to have stage IV breast cancer, originally diagnosed in April 2003 and now manifested chiefly by loco-regional nodal disease (neck, chest), without lung, liver, bone, or brain involvement. he has received extensive radiation and chemotherapy after the surgeries. She's always had bilateral lymphedema of her arms but now comes with an ulcerated area on her right upper arm posteriorly. Her medical Oncologist, Dr.Gustav [name], was concerned that what we may be seeing in [name]'s right upper extremity is actually skin spread of her cancer. This needs to be biopsied and she was sending her back to general surgery with that in mind. She placed a referral to the wound clinic . She has been on doxycycline for now. The general surgeon saw her 2 days ago and did a biopsy and this report is pending. In April 2016 she had a right upper arm venous duplex study which showed: No evidence of deep vein or superficial thrombosis involving the ooright upper extremity and left subclavian vein. Moderate edema noted throughout the right upper extremity. 05/01/2015 the patient tells me that her surgeon revealed that her biopsy was negative and that there was no pathology found. Other than that she is doing well and she thinks her edema has gone down a bit. I had done a biopsy for her about a year ago and final report is not back yet but the pathologist Dr. Chrystine Oiler did speak to me on the phone a few days ago and mentioned that he is doing final stains and there may  be a mixture of a  angiosarcoma and metastatic breast cancer. I have not been able to speak to Dr. Jana Hakim yet but I understand the patient has seen him today and he has discussed the provisional pathology and offered her some chemotherapy. The final pathology report is back and it shows a metastatic breast carcinoma and an atypical vascular proliferation. 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 She informs me that she is going to [facility] to the [place] for further care. [name] ([id number]) Electronic Signature(s) Signed: 06/30/2016 2:08:17 PM By: [name], FACS Previous Signature: 06/30/2016 1:41:46 PM Version By: [name], FACS Entered By: [name] on 06/30/2016 14:08:16 [name] ([id number]) -------------------------------------------------------------------------------- Physical Exam Details Patient Name: [name] 06/30/2016 12:45 Date of Service: PM Medical Record [id number] Number: Patient Account Number: [id number] [birth year] (60 y.o. Treating RN: Ahmed Prima Date of Birth/Sex: Female) Other Clinician: Primary Care Physician: [name] Treating [name] Referring Physician: Lurline Del Physician/Extender: Weeks in Treatment: 0 Constitutional . Pulse regular. Respirations normal and unlabored. Afebrile. . Eyes Nonicteric. Reactive to light. Ears, Nose, Mouth, and Throat Lips, teeth, and gums WNL.Marland Kitchen Moist mucosa without lesions. Neck supple and nontender. No palpable supraclavicular or cervical adenopathy. Normal sized without goiter. Respiratory WNL. No retractions.. Cardiovascular Pedal Pulses WNL. No clubbing, cyanosis or edema. Lymphatic No adneopathy. No adenopathy. No adenopathy. Musculoskeletal Adexa without tenderness or enlargement.. Digits and nails w/o clubbing, cyanosis, infection, petechiae, ischemia, or inflammatory conditions.. Integumentary (Hair, Skin) No suspicious  lesions. No crepitus or fluctuance. No peri-wound warmth or erythema. No masses.Marland Kitchen Psychiatric Judgement and insight Intact.. No evidence of depression, anxiety, or agitation.. Notes he has got a lot of lymphedema right upper arm and she has got a circumferential ulcer which is there around her proximal third of the upper arm. She also has 2 significant large ulcerated areas on her back which are covered with minimal slough. No debridement was recommended. Electronic Signature(s) Signed: 06/30/2016 2:18:29 PM By: [name], FACS Entered By: [name] on 06/30/2016 14:18:28 [name] ([id number]) -------------------------------------------------------------------------------- Physician Orders Details Patient Name: [name] 06/30/2016 12:45 Date of Service: PM Medical Record [id number] Number: Patient Account Number: [id number] 01-02-1956 (60 y.o. Treating RN: Baruch Gouty, RN, BSN, Velva Harman Date of Birth/Sex: Female) Other Clinician: Primary Care Physician: [name] Treating Willie Plain Referring Physician: Lurline Del Physician/Extender: Suella Grove in Treatment: 0 Verbal / Phone Orders: Yes Clinician: Afful, RN, BSN, Rita Read Back and Verified: Yes Diagnosis Coding Wound Cleansing Wound #2 Right,Circumferential Upper Arm o Cleanse wound with mild soap and water o May Shower, gently pat wound dry prior to applying new dressing. o May shower with protection. Wound #3 Right,Proximal Back o Cleanse wound with mild soap and water o May Shower, gently pat wound dry prior to applying new dressing. o May shower with protection. Primary Wound Dressing Wound #2 Right,Circumferential Upper Arm o Aquacel Ag Wound #3 Right,Proximal Back o Aquacel Ag Secondary Dressing Wound #2 Right,Circumferential Upper Arm o ABD pad Wound #3 Right,Proximal Back o ABD pad Dressing Change Frequency Wound #2 Right,Circumferential Upper Arm o Change dressing  every day. Wound #3 Right,Proximal Back o Change dressing every day. Follow-up Appointments Wound #2 Right,Circumferential Upper Arm Hammar, Marisella ([id number]) o Return Appointment in: - 3 weeks Wound #3 Right,Proximal Back o Return Appointment in: - 3 weeks Additional Orders / Instructions Wound #2 Right,Circumferential Upper Arm o Increase protein intake. o Activity as tolerated Wound #3 Right,Proximal Back o Increase protein intake. o Activity as tolerated Electronic Signature(s) Signed: 06/30/2016  4:46:57 PM By: [name], FACS Signed: 06/30/2016 5:25:53 PM By: Regan Lemming BSN, RN Entered By: Regan Lemming on 06/30/2016 13:56:50 [name] ([id number]) -------------------------------------------------------------------------------- Problem List Details Patient Name: [name] 06/30/2016 12:45 Date of Service: PM Medical Record [id number] Number: Patient Account Number: [id number] 05/31/1956 (60 y.o. Treating RN: Ahmed Prima Date of Birth/Sex: Female) Other Clinician: Primary Care Physician: [name] Treating Salvatrice Morandi Referring Physician: Lurline Del Physician/Extender: Suella Grove in Treatment: 0 Active Problems ICD-10 Encounter Code Description Active Date Diagnosis Z85.3 Personal history of malignant neoplasm of breast 06/30/2016 Yes C49.11 Malignant neoplasm of connective and soft tissue of right 06/30/2016 Yes upper limb, including shoulder S41.101A Unspecified open wound of right upper arm, initial 06/30/2016 Yes encounter S21.90XA Unspecified open wound of unspecified part of thorax, 06/30/2016 Yes initial encounter 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 Treating RN: Ahmed Prima Date of Birth/Sex: Female) Other Clinician: Primary Care Physician: [name] Treating [name] Referring Physician: Lurline Del Physician/Extender: Suella Grove in Treatment: 0 Subjective Chief Complaint Information obtained from Patient Patient presents to the wound care center for a consult due non healing wound. The patient was known to have bilateral lymphedema as a status post mastectomy syndrome now comes with an ulcerated area of her right upper arm and back which she's had for several weeks. History of Present Illness (HPI) The following HPI elements were documented for the patient's wound: Location: bilateral arm lymphedema with now skin ulceration of the right upper extremity. Quality: Patient reports experiencing a dull pain to affected area(s). Severity: Patient states wound are getting worse. Duration: Patient has had the wound for > 18 months prior to seeking treatment at the wound center Timing: Pain in wound is Intermittent (comes and goes Context: The wound occurred when the patient . Modifying Factors: Consults to this date include: local care and antibiotics as prescribed by the PCP and the ER. Associated Signs and Symptoms: Patient reports having increase discharge. 60 year old patient known to [name] from a prior visit about a year ago, returns for care. she is known to have stage IV breast cancer, angiosarcoma and has been on treatment with the medical oncologist Dr. Jana Hakim. She has been seeing the hospice nurses earlier and has been continuing with chemotherapy. She was known to have stage IV breast cancer, originally diagnosed in April 2003 and now  manifested chiefly by loco-regional nodal disease (neck, chest), without lung, liver, bone, or brain involvement. he has received extensive radiation and chemotherapy after the surgeries. She's always had bilateral lymphedema of her arms but now comes with an ulcerated area on her right upper arm posteriorly. Her medical Oncologist, Dr.Gustav [name], was concerned that what we may be seeing in Faria's right upper extremity is actually skin spread of her cancer. This needs to be biopsied and she was sending her back to general surgery with that in mind. She placed a referral to the wound clinic . She has been on doxycycline for now. The general surgeon saw her 2 days ago and did a biopsy and this report is pending. In April 2016 she had a right upper arm venous duplex study which showed: No evidence of deep vein or superficial thrombosis involving the right upper  extremity and left subclavian vein. Moderate edema noted throughout the right upper extremity. 05/01/2015 the patient tells me that her surgeon revealed that her biopsy was negative and that there was no pathology found. Other than that she is doing well and she thinks her edema has gone down a bit. JAZLYNE, GUTTER ([id number]) I had done a biopsy for her about a year ago and final report is not back yet but the pathologist Dr. Chrystine Oiler did speak to me on the phone a few days ago and mentioned that he is doing final stains and there may be a mixture of a angiosarcoma and metastatic breast cancer. I have not been able to speak to Dr. Jana Hakim yet but I understand the patient has seen him today and he has discussed the provisional pathology and offered her some chemotherapy. The final pathology report is back and it shows a metastatic breast carcinoma and an atypical vascular proliferation. 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 She informs me that she is going to [facility] to the [facility] for further care. Wound  History Patient presents with 2 open wounds that have been present for approximately 6 months. Patient has been treating wounds in the following manner: clean and place adaptic on it. The wounds have been healed in the past but have re-opened. Laboratory tests have not been performed in the last month. Patient reportedly has not tested positive for an antibiotic resistant organism. Patient reportedly has not tested positive for osteomyelitis. Patient reportedly has not had testing performed to evaluate circulation in the legs. Patient experiences the following problems associated with their wounds: infection, swelling. Patient History Information obtained from Patient. Allergies No Known Allergies Family History Cancer - Siblings, Mother, Kidney Disease - Siblings, No family history of Diabetes, Heart Disease, Hereditary Spherocytosis, Hypertension, Lung Disease, Seizures, Stroke, Thyroid Problems, Tuberculosis. Social History Current every day smoker, Marital Status - Married, Alcohol Use - Never, Drug Use - No History, Caffeine Use - Daily. Medical History Oncologic Patient has history of Received Chemotherapy - 2014 last dose, current Medical And Surgical History Notes Musculoskeletal rheumatic fever Oncologic breast cancer post bilateral mastectomy Review of Systems (ROS) Constitutional Symptoms (General Health) The patient has no complaints or symptoms. Eyes The patient has no complaints or symptoms. Ear/Nose/Mouth/Throat The patient has no complaints or symptoms. [name] ([id number]) Hematologic/Lymphatic The patient has no complaints or symptoms. Respiratory The patient has no complaints or symptoms. Gastrointestinal The patient has no complaints or symptoms. Endocrine The patient has no complaints or symptoms. Genitourinary The patient has no complaints or symptoms. Immunological The patient has no complaints or symptoms. Integumentary (Skin) Complains or  has symptoms of Wounds. Musculoskeletal The patient has no complaints or symptoms. Neurologic The patient has no complaints or symptoms. Psychiatric Complains or has symptoms of Anxiety, depression Medications calcium Neurontin triamterene Vitamin D3 oxycodone 10 mg tablet oral 1 1 tablet oral evert six hours alprazolam 2 mg tablet oral 1 1 tablet oral two times daily Vitamin C 500 mg tablet oral 1 1 tablet oral daily Objective Constitutional Pulse regular. Respirations normal and unlabored. Afebrile. Vitals Time Taken: 1:11 PM, Height: 65 in, Source: Stated, Weight: 159.2 lbs, Source: Measured, BMI: 26.5, Temperature: 98.0 F, Pulse: 81 bpm, Respiratory Rate: 18 breaths/min, Blood Pressure: 127/63 mmHg. Eyes Nonicteric. Reactive to light. Boman, Avalin ([id number]) Ears, Nose, Mouth, and Throat Lips, teeth, and gums WNL.Marland Kitchen Moist mucosa without lesions. Neck supple and nontender. No palpable supraclavicular or cervical adenopathy. Normal sized without goiter.  Respiratory WNL. No retractions.. Cardiovascular Pedal Pulses WNL. No clubbing, cyanosis or edema. Lymphatic No adneopathy. No adenopathy. No adenopathy. Musculoskeletal Adexa without tenderness or enlargement.. Digits and nails w/o clubbing, cyanosis, infection, petechiae, ischemia, or inflammatory conditions.Marland Kitchen Psychiatric Judgement and insight Intact.. No evidence of depression, anxiety, or agitation.. General Notes: he has got a lot of lymphedema right upper arm and she has got a circumferential ulcer which is there around her proximal third of the upper arm. She also has 2 significant large ulcerated areas on her back which are covered with minimal slough. No debridement was recommended. Integumentary (Hair, Skin) No suspicious lesions. No crepitus or fluctuance. No peri-wound warmth or erythema. No masses.. Wound #2 status is Open. Original cause of wound was Gradually Appeared. The wound is located on  the Right,Circumferential Upper Arm. The wound measures 14.5cm length x 23.5cm width x 0.1cm depth; 267.624cm^2 area and 26.762cm^3 volume. The wound is limited to skin breakdown. There is no tunneling or undermining noted. There is a large amount of serosanguineous drainage noted. The wound margin is distinct with the outline attached to the wound base. There is large (67-100%) red granulation within the wound bed. There is a small (1-33%) amount of necrotic tissue within the wound bed including Adherent Slough. The periwound skin appearance exhibited: Localized Edema, Moist. Periwound temperature was noted as No Abnormality. The periwound has tenderness on palpation. Wound #3 status is Open. Original cause of wound was Gradually Appeared. The wound is located on the Right,Proximal Back. The wound measures 9cm length x 14cm width x 0.2cm depth; 98.96cm^2 area and 19.792cm^3 volume. The wound is limited to skin breakdown. There is no tunneling or undermining noted. There is a large amount of serosanguineous drainage noted. The wound margin is distinct with the outline attached to the wound base. There is no granulation within the wound bed. There is a large (67-100%) amount of necrotic tissue within the wound bed including Adherent Slough. Periwound temperature was noted as No Abnormality. The periwound has tenderness on palpation. [name] ([id number]) Assessment Active Problems ICD-10 Z85.3 - Personal history of malignant neoplasm of breast C49.11 - Malignant neoplasm of connective and soft tissue of right upper limb, including shoulder S41.101A - Unspecified open wound of right upper arm, initial encounter S21.90XA - Unspecified open wound of unspecified part of thorax, initial encounter I97.2 - Postmastectomy lymphedema syndrome The patient has a stage IV breast cancer with lymphedema and angiosarcoma and at this stage is receiving chemotherapy and other palliative care as per her  medical oncologist. I have clearly stated to her that we will continue with supportive wound care and I have recommended: 1. alginate to be placed over all the wounds and covered with a bordered foam and tape. 2. She may shower or wash with soap and water and then reapply her dressing on alternate days 3. she is here for palliative care and we will see her in 3-4 weeks time Plan Wound Cleansing: Wound #2 Right,Circumferential Upper Arm: Cleanse wound with mild soap and water May Shower, gently pat wound dry prior to applying new dressing. May shower with protection. Wound #3 Right,Proximal Back: Cleanse wound with mild soap and water May Shower, gently pat wound dry prior to applying new dressing. May shower with protection. Primary Wound Dressing: Wound #2 Right,Circumferential Upper Arm: Aquacel Ag Wound #3 Right,Proximal Back: Aquacel Ag Secondary Dressing: Wound #2 Right,Circumferential Upper Arm: ABD pad Wound #3 Right,Proximal Back: ABD pad Dressing Change Frequency: Wound #2 Right,Circumferential Upper Arm:  Change dressing every day. Wound #3 Right,Proximal Back: [name] ([id number]) Change dressing every day. Follow-up Appointments: Wound #2 Right,Circumferential Upper Arm: Return Appointment in: - 3 weeks Wound #3 Right,Proximal Back: Return Appointment in: - 3 weeks Additional Orders / Instructions: Wound #2 Right,Circumferential Upper Arm: Increase protein intake. Activity as tolerated Wound #3 Right,Proximal Back: Increase protein intake. Activity as tolerated The patient has a stage IV breast cancer with lymphedema and angiosarcoma and at this stage is receiving chemotherapy and other palliative care as per her medical oncologist. I have clearly stated to her that we will continue with supportive wound care and I have recommended: 1. alginate to be placed over all the wounds and covered with a bordered foam and tape. 2. She may shower or wash with soap  and water and then reapply her dressing on alternate days 3. she is here for palliative care and we will see her in 3-4 weeks time Electronic Signature(s) Signed: 06/30/2016 2:20:30 PM By: [name], FACS Entered By: [name] on 06/30/2016 14:20:29 [name] ([id number]) -------------------------------------------------------------------------------- ROS/PFSH Details Patient Name: [name], HUKILL 06/30/2016 12:45 Date of Service: PM Medical Record [id number] Number: Patient Account Number: [id number] 10-03-1956 (60 y.o. Treating RN: Ahmed Prima Date of Birth/Sex: Female) Other Clinician: Primary Care Physician: [name] Treating Chales Pelissier Referring Physician: Lurline Del Physician/Extender: Suella Grove in Treatment: 0 Information Obtained From Patient Wound History Do you currently have one or more open woundso Yes How many open wounds do you currently haveo 2 Approximately how long have you had your woundso 6 months How have you been treating your wound(s) until nowo clean and place adaptic on it Has your wound(s) ever healed and then re-openedo Yes Have you had any lab work done in the past montho No Have you tested positive for an antibiotic resistant organism (MRSA, No VRE)o Have you tested positive for osteomyelitis (bone infection)o No Have you had any tests for circulation on your legso No Have you had other problems associated with your woundso Infection, Swelling Integumentary (Skin) Complaints and Symptoms: Positive for: Wounds Medical History: Negative for: History of Burn; History of pressure wounds Psychiatric Complaints and Symptoms: Positive for: Anxiety Review of System Notes: depression Constitutional Symptoms (General Health) Complaints and Symptoms: No Complaints or Symptoms Eyes Complaints and Symptoms: No Complaints or Symptoms Cumpton, Audryanna ([id number]) Medical History: Negative for: Cataracts; Glaucoma; Optic  Neuritis Ear/Nose/Mouth/Throat Complaints and Symptoms: No Complaints or Symptoms Medical History: Negative for: Chronic sinus problems/congestion; Middle ear problems Hematologic/Lymphatic Complaints and Symptoms: No Complaints or Symptoms Medical History: Negative for: Anemia; Hemophilia; Human Immunodeficiency Virus; Lymphedema; Sickle Cell Disease Respiratory Complaints and Symptoms: No Complaints or Symptoms Medical History: Negative for: Aspiration; Asthma; Chronic Obstructive Pulmonary Disease (COPD); Pneumothorax; Sleep Apnea; Tuberculosis Cardiovascular Medical History: Positive for: Deep Vein Thrombosis - right arm; Hypertension Negative for: Angina; Arrhythmia; Congestive Heart Failure; Coronary Artery Disease; Hypotension; Myocardial Infarction; Peripheral Arterial Disease; Peripheral Venous Disease; Phlebitis; Vasculitis Gastrointestinal Complaints and Symptoms: No Complaints or Symptoms Medical History: Negative for: Cirrhosis ; Colitis; Crohnos; Hepatitis A; Hepatitis B; Hepatitis C Endocrine Complaints and Symptoms: No Complaints or Symptoms Medical History: Negative for: Type I Diabetes; Type II Diabetes Pree, Nickayla ([id number]) Genitourinary Complaints and Symptoms: No Complaints or Symptoms Medical History: Negative for: End Stage Renal Disease Immunological Complaints and Symptoms: No Complaints or Symptoms Medical History: Negative for: Lupus Erythematosus; Raynaudos; Scleroderma Musculoskeletal Complaints and Symptoms: No Complaints or Symptoms Medical History: Negative for: Gout; Rheumatoid Arthritis; Osteoarthritis; Osteomyelitis Past Medical History Notes:  rheumatic fever Neurologic Complaints and Symptoms: No Complaints or Symptoms Medical History: Negative for: Dementia; Neuropathy; Quadriplegia; Paraplegia; Seizure Disorder Oncologic Medical History: Positive for: Received Chemotherapy - 2014 last dose, current; Received Radiation  - 2003 Past Medical History Notes: breast cancer post bilateral mastectomy Family and Social History Cancer: Yes - Siblings, Mother; Diabetes: No; Heart Disease: No; Hereditary Spherocytosis: No; Hypertension: No; Kidney Disease: Yes - Siblings; Lung Disease: No; Seizures: No; Stroke: No; Thyroid Problems: No; Tuberculosis: No; Current every day smoker; Marital Status - Married; Alcohol Use: Never; Drug Use: No History; Caffeine Use: Daily; Financial Concerns: No; Food, Clothing or Shelter Needs: No; Support System Lacking: No; Transportation Concerns: No; Advanced Directives: No; Patient does not want information on Advanced Directives; Medical Power of Attorney: No Physician Affirmation I have reviewed and agree with the above information. AZERIA, ALEO ([id number]) Electronic Signature(s) Signed: 06/30/2016 4:46:57 PM By: [name], FACS Signed: 06/30/2016 5:32:44 PM By: Alric Quan Entered By: [name] on 06/30/2016 14:03:40 [name] ([id number]) -------------------------------------------------------------------------------- SuperBill Details Patient Name: [name] Date of Service: 06/30/2016 Medical Record Number: [id number] Patient Account Number: [id number] Date of Birth/Sex: 1956-06-30 (60 y.o. 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 Facility Procedures CPT4 Code: [id number] Description: 99214 - WOUND CARE  VISIT-LEV 4 EST PT Modifier: Quantity: 1 Physician Procedures CPT4: Description Modifier Quantity Code [id number] 99214 - WC PHYS LEVEL 4 - EST PT 1 ICD-10 Description Diagnosis Z85.3 Personal history of malignant neoplasm of breast C49.11 Malignant neoplasm of connective and soft tissue of right upper limb, including  shoulder S41.101A Unspecified open wound of right upper arm, initial encounter S21.90XA Unspecified open wound of unspecified part of thorax, initial encounter Electronic Signature(s) Signed: 06/30/2016 5:06:46 PM By: Regan Lemming BSN, RN Signed: 07/01/2016 7:49:58 AM By: [name], FACS Previous Signature: 06/30/2016 2:20:51 PM Version By: [name], FACS Entered By: Regan Lemming on 06/30/2016 17:06:46

## 2016-07-01 NOTE — Progress Notes (Signed)
HAI, NARDINI ([id number]) Visit Report for 06/30/2016 Abuse/Suicide Risk Screen Details Patient Name: [name], [name] 06/30/2016 12:45 Date of Service: PM Medical Record [id number] Number: Patient Account Number: [id number] [birth year] (60 y.o. Treating RN: Ahmed Prima Date of Birth/Sex: Female) Other Clinician: Primary Care Physician: [name] Treating Britto, Errol Referring Physician: Lurline Del Physician/Extender: Weeks in Treatment: 0 Abuse/Suicide Risk Screen Items Answer ABUSE/SUICIDE RISK SCREEN: Has anyone close to you tried to hurt or harm you recentlyo No Do you feel uncomfortable with anyone in your familyo No Has anyone forced you do things that you didnot want to doo No Do you have any thoughts of harming yourselfo No Patient displays signs or symptoms of abuse and/or neglect. No Electronic Signature(s) Signed: 06/30/2016 5:32:44 PM By: Alric Quan Entered By: Alric Quan on 06/30/2016 13:15:41 [name] ([id number]) -------------------------------------------------------------------------------- Activities of Daily Living Details Patient Name: [name], [name] 06/30/2016 12:45 Date of Service: PM Medical Record [id number] Number: Patient Account Number: [id number] [birth year] (60 y.o. Treating RN: Ahmed Prima Date of Birth/Sex: Female) Other Clinician: Primary Care Physician: [name] Treating [name] Referring Physician: Lurline Del Physician/Extender: Weeks in Treatment: 0 Activities of Daily Living Items Answer Activities of Daily Living (Please select one for each item) Take Medications Completely Able Use Telephone Completely Able Care for Appearance Completely Able Use Toilet Completely Able Bath / Shower Completely Able Dress Self Completely Able Feed Self Completely Able Walk Completely Able Get In / Out Bed Completely Able Housework Completely Able Prepare Meals Completely [place] for Self Completely Able Electronic Signature(s) Signed: 06/30/2016 5:32:44 PM By: Alric Quan Entered By: Alric Quan on 06/30/2016 13:15:58 [name] ([id number]) -------------------------------------------------------------------------------- Education Assessment Details Patient Name: [name], [name] 06/30/2016 12:45 Date of Service: PM Medical Record [id number] Number: Patient Account Number: [id number] 02-23-1956 (60 y.o. Treating RN: Ahmed Prima Date of Birth/Sex: Female) Other Clinician: Primary Care Physician: [name] Treating [name] Referring Physician: Lurline Del Physician/Extender: Suella [place] in Treatment: 0 Primary Learner Assessed: Patient Learning Preferences/Education Level/Primary Language Learning Preference: Explanation, Printed Material Highest Education Level: High School Preferred Language: English Cognitive Barrier Assessment/Beliefs Language Barrier: No Translator Needed: No Memory Deficit: No Emotional Barrier: No Cultural/Religious Beliefs Affecting Medical No Care: Physical Barrier Assessment Impaired Vision: No Impaired Hearing: No Decreased Hand dexterity: No Knowledge/Comprehension Assessment Knowledge Level: High Comprehension Level: High Ability to understand written High instructions: Ability to understand verbal High instructions: Motivation Assessment Anxiety Level: Calm Cooperation: Cooperative Education Importance: Acknowledges Need Interest in Health Problems: Asks Questions Perception: Coherent Willingness to Engage in Self- High Management Activities: Readiness to Engage in Self- High Management Activities: DANDRIA, PUENTE ([id number]) Electronic Signature(s) Signed: 06/30/2016 5:32:44 PM By: Alric Quan Entered By: Alric Quan on 06/30/2016 13:16:17 [name]  ([id number]) -------------------------------------------------------------------------------- Fall Risk Assessment Details Patient Name: [name], [name] 06/30/2016 12:45 Date of Service: PM Medical Record [id number] Number: Patient Account Number: [id number] 09/02/1956 (60 y.o. Treating RN: Ahmed Prima Date of Birth/Sex: Female) Other Clinician: Primary Care Physician: [name] Treating Britto, Errol Referring Physician: Lurline Del Physician/Extender: Suella [place] in Treatment: 0 Fall Risk Assessment Items Have you had 2 or more falls in the last 12 monthso 0 No Have you had any fall that resulted in injury in the last 12 monthso 0 No FALL RISK ASSESSMENT: History of falling - immediate or within 3 months 0 No Secondary diagnosis 15 Yes Ambulatory aid None/bed rest/wheelchair/nurse 0 No Crutches/cane/walker 0 No Furniture 0 No IV Access/Saline Lock 0 No Gait/Training Normal/bed rest/immobile 0 No Weak  10 Yes Impaired 0 No Mental Status Oriented to own ability 0 No Electronic Signature(s) Signed: 06/30/2016 5:32:44 PM By: Alric Quan Entered By: Alric Quan on 06/30/2016 13:17:21 [name] ([id number]) -------------------------------------------------------------------------------- Foot Assessment Details Patient Name: [name], [name] 06/30/2016 12:45 Date of Service: PM Medical Record [id number] Number: Patient Account Number: [id number] 12/06/1955 (60 y.o. Treating RN: Ahmed Prima Date of Birth/Sex: Female) Other Clinician: Primary Care Physician: [name] Treating Britto, Errol Referring Physician: Lurline Del Physician/Extender: Suella [place] in Treatment: 0 Foot Assessment Items Site Locations + = Sensation present, - = Sensation absent, C = Callus, U = Ulcer R = Redness, W = Warmth, M = Maceration, PU = Pre-ulcerative lesion F = Fissure, S = Swelling, D = Dryness Assessment Right: Left: Other Deformity: No No Prior Foot Ulcer:  No No Prior Amputation: No No Charcot Joint: No No Ambulatory Status: Gait: Electronic Signature(s) Signed: 06/30/2016 5:32:44 PM By: Alric Quan Entered By: Alric Quan on 06/30/2016 13:17:51 [name] ([id number]) -------------------------------------------------------------------------------- Nutrition Risk Assessment Details Patient Name: [name], [name] 06/30/2016 12:45 Date of Service: PM Medical Record [id number] Number: Patient Account Number: [id number] [birth year] (60 y.o. Treating RN: Ahmed Prima Date of Birth/Sex: Female) Other Clinician: Primary Care Physician: [name] Treating Britto, Errol Referring Physician: Lurline Del Physician/Extender: Suella [place] in Treatment: 0 Height (in): 65 Weight (lbs): 159.2 Body Mass Index (BMI): 26.5 Nutrition Risk Assessment Items NUTRITION RISK SCREEN: I have an illness or condition that made me change the kind and/or 2 Yes amount of food I eat I eat fewer than two meals per day 3 Yes I eat few fruits and vegetables, or milk products 0 No I have three or more drinks of beer, liquor or wine almost every day 0 No I have tooth or mouth problems that make it hard for me to eat 0 No I don't always have enough money to buy the food I need 0 No I eat alone most of the time 0 No I take three or more different prescribed or over-the-counter drugs a 1 Yes day Without wanting to, I have lost or gained 10 pounds in the last six 0 No months I am not always physically able to shop, cook and/or feed myself 0 No Nutrition Protocols Good Risk Protocol Moderate Risk Protocol Electronic Signature(s) Signed: 06/30/2016 5:32:44 PM By: Alric Quan Entered By: Alric Quan on 06/30/2016 13:17:42

## 2016-07-04 ENCOUNTER — Telehealth: Payer: Self-pay | Admitting: *Deleted

## 2016-07-04 NOTE — Telephone Encounter (Signed)
"  My Medicine is missing.  My oxycodone is missing."  Unable to tell me when it was last seen or used.  "I had a lot of people from out of town visiting."  Collaborative nurse received request and will call patient.

## 2016-07-05 ENCOUNTER — Other Ambulatory Visit: Payer: Self-pay | Admitting: *Deleted

## 2016-07-05 DIAGNOSIS — L98491 Non-pressure chronic ulcer of skin of other sites limited to breakdown of skin: Secondary | ICD-10-CM | POA: Diagnosis not present

## 2016-07-05 MED ORDER — FENTANYL 75 MCG/HR TD PT72: 75.0000 ug | MEDICATED_PATCH | TRANSDERMAL | 0 refills | Status: DC

## 2016-07-05 MED ORDER — OXYCODONE HCL 15 MG PO TABS: 30.0000 mg | ORAL_TABLET | ORAL | 0 refills | Status: DC | PRN

## 2016-07-05 NOTE — Telephone Encounter (Signed)
Pt came in to this office with police report dated for today.  New prescriptions given as well as page of office expectations related to pt's responsibility with narcotic prescriptions.  Pt states she has obtained a " lock box " to keep her medications in for safe keeping.

## 2016-07-05 NOTE — Telephone Encounter (Signed)
This RN spoke with MD and Pt regarding her call stating since having family in home last week she cannot find her oxyIR or fentanyl patches.  " I need [name] to write me new ones even if I have to pay for them instead of my insurance"  Informed pt of need to file a report with the police - she can bring the report to this office to obtain appropriate refills.  This RN reiterated due to unfortunate misuse of narcotics by the public - which often includes our family members - she needs to keep her narcotics in a safe place which may need to be a locked box that she keeps the key.  This RN also informed pt that once this office gives a patient a narcotic prescription it is her responsibility of keeping those drug safe. Informed her per [facility] - drug prescriptions are monitored under prescribing MD- early refills have to have appropriate documentation as to why they are being written.  Prescribes can loose their license if narcotic prescriptions are dispensed inappropriately.  Goal of this office is to provide pain control by use of narcotics in the safest manner.  Brynae verbalized understanding - and stated she will contact the police asap.

## 2016-07-08 ENCOUNTER — Ambulatory Visit: Payer: Commercial Managed Care - PPO | Admitting: Surgery

## 2016-07-15 ENCOUNTER — Ambulatory Visit: Payer: Commercial Managed Care - PPO | Admitting: Surgery

## 2016-07-19 ENCOUNTER — Other Ambulatory Visit: Payer: Self-pay | Admitting: Oncology

## 2016-07-19 ENCOUNTER — Telehealth: Payer: Self-pay | Admitting: *Deleted

## 2016-07-19 ENCOUNTER — Other Ambulatory Visit (HOSPITAL_BASED_OUTPATIENT_CLINIC_OR_DEPARTMENT_OTHER): Payer: Commercial Managed Care - PPO

## 2016-07-19 ENCOUNTER — Ambulatory Visit: Payer: Commercial Managed Care - PPO

## 2016-07-19 ENCOUNTER — Ambulatory Visit (HOSPITAL_BASED_OUTPATIENT_CLINIC_OR_DEPARTMENT_OTHER): Payer: Commercial Managed Care - PPO | Admitting: Oncology

## 2016-07-19 ENCOUNTER — Ambulatory Visit (HOSPITAL_BASED_OUTPATIENT_CLINIC_OR_DEPARTMENT_OTHER): Payer: Commercial Managed Care - PPO

## 2016-07-19 VITALS — BP 123/55 | HR 65 | Temp 98.4°F | Resp 18 | Ht 65.0 in | Wt 159.7 lb

## 2016-07-19 DIAGNOSIS — C778 Secondary and unspecified malignant neoplasm of lymph nodes of multiple regions: Secondary | ICD-10-CM | POA: Diagnosis not present

## 2016-07-19 DIAGNOSIS — C4911 Malignant neoplasm of connective and soft tissue of right upper limb, including shoulder: Secondary | ICD-10-CM

## 2016-07-19 DIAGNOSIS — Z170 Estrogen receptor positive status [ER+]: Secondary | ICD-10-CM

## 2016-07-19 DIAGNOSIS — C50211 Malignant neoplasm of upper-inner quadrant of right female breast: Secondary | ICD-10-CM

## 2016-07-19 DIAGNOSIS — Z95828 Presence of other vascular implants and grafts: Secondary | ICD-10-CM

## 2016-07-19 DIAGNOSIS — I890 Lymphedema, not elsewhere classified: Secondary | ICD-10-CM | POA: Diagnosis not present

## 2016-07-19 DIAGNOSIS — Z86718 Personal history of other venous thrombosis and embolism: Secondary | ICD-10-CM

## 2016-07-19 LAB — CBC WITH DIFFERENTIAL/PLATELET
BASO%: 1 % (ref 0.0–2.0)
Basophils Absolute: 0.1 10*3/uL (ref 0.0–0.1)
EOS%: 2.4 % (ref 0.0–7.0)
Eosinophils Absolute: 0.2 10*3/uL (ref 0.0–0.5)
HCT: 38.2 % (ref 34.8–46.6)
HGB: 12 g/dL (ref 11.6–15.9)
LYMPH%: 27.6 % (ref 14.0–49.7)
MCH: 25.9 pg (ref 25.1–34.0)
MCHC: 31.6 g/dL (ref 31.5–36.0)
MCV: 82.1 fL (ref 79.5–101.0)
MONO#: 0.7 10*3/uL (ref 0.1–0.9)
MONO%: 10.3 % (ref 0.0–14.0)
NEUT#: 4.3 10*3/uL (ref 1.5–6.5)
NEUT%: 58.7 % (ref 38.4–76.8)
Platelets: 329 10*3/uL (ref 145–400)
RBC: 4.65 10*6/uL (ref 3.70–5.45)
RDW: 16.8 % — ABNORMAL HIGH (ref 11.2–14.5)
WBC: 7.3 10*3/uL (ref 3.9–10.3)
lymph#: 2 10*3/uL (ref 0.9–3.3)

## 2016-07-19 LAB — COMPREHENSIVE METABOLIC PANEL
ALT: 13 U/L (ref 0–55)
AST: 20 U/L (ref 5–34)
Albumin: 3.3 g/dL — ABNORMAL LOW (ref 3.5–5.0)
Alkaline Phosphatase: 82 U/L (ref 40–150)
Anion Gap: 9 mEq/L (ref 3–11)
BUN: 5.9 mg/dL — ABNORMAL LOW (ref 7.0–26.0)
CO2: 30 mEq/L — ABNORMAL HIGH (ref 22–29)
Calcium: 9.6 mg/dL (ref 8.4–10.4)
Chloride: 101 mEq/L (ref 98–109)
Creatinine: 0.8 mg/dL (ref 0.6–1.1)
EGFR: 89 mL/min/{1.73_m2} — ABNORMAL LOW (ref >90)
Glucose: 104 mg/dl (ref 70–140)
Potassium: 4 mEq/L (ref 3.5–5.1)
Sodium: 140 mEq/L (ref 136–145)
Total Bilirubin: <0.3 mg/dL (ref 0.20–1.20)
Total Protein: 7.2 g/dL (ref 6.4–8.3)

## 2016-07-19 MED ORDER — OXYCODONE HCL 15 MG PO TABS: 30.0000 mg | ORAL_TABLET | ORAL | 0 refills | Status: DC | PRN

## 2016-07-19 MED ORDER — HEPARIN SOD (PORK) LOCK FLUSH 100 UNIT/ML IV SOLN
500.0000 [IU] | Freq: Once | INTRAVENOUS | Status: AC | PRN
  Administered 2016-07-19: 500 [IU] via INTRAVENOUS
  Filled 2016-07-19: qty 5

## 2016-07-19 MED ORDER — VALACYCLOVIR HCL 500 MG PO TABS: 500.0000 mg | ORAL_TABLET | Freq: Two times a day (BID) | ORAL | 12 refills | Status: DC

## 2016-07-19 MED ORDER — SODIUM CHLORIDE 0.9 % IJ SOLN
10.0000 mL | INTRAMUSCULAR | Status: DC | PRN
  Administered 2016-07-19: 10 mL via INTRAVENOUS
  Filled 2016-07-19: qty 10

## 2016-07-19 MED ORDER — OXYCODONE HCL ER 20 MG PO T12A: 20.0000 mg | EXTENDED_RELEASE_TABLET | Freq: Two times a day (BID) | ORAL | 0 refills | Status: DC

## 2016-07-19 NOTE — Progress Notes (Signed)
[place]  [phone] [phone]  OFFICE PROGRESS NOTE   ID: [name]   DOB: 1955-12-05  MR#: [id number]  IPJ#:[id number]   PCP: [name] Hey, MD SU: [name]. [name], [name]/[name], [name]/[name] Hoxworth MD RAD [facility]:[id number] [name], [name] OTHER:  [name] Bickers, MD, [name] Fudge MD  CHIEF COMPLAINT:  Stage IV Breast Cancer, angiosarcoma  CURRENT TREATMENT: Abraxane, trastuzumab, pertuzumab  BREAST CANCER HISTORY: From the original intake note:  The patient originally presented in May 2003 when she noticed a lump in the upper inner quadrant of her right breast in September 2003.  She sought attention and had a mammogram which showed an obvious carcinoma in the upper inner quadrant of the right breast, approximately 2 cm.  There were some enlarged lymph nodes in the axilla and an FNA done showed those consistent with malignant cells, most likely an invasive ductal carcinoma.  At that point she was unsure of what to do and was referred by Dr. Marylene [name] for a discussion of her treatment options.  By biopsy, it was ER/PR negative and HER-2 was 3+.  DNA index was 1.42.  We reiterated that it was most important to have her disease surgically addressed at that point and had recommended lumpectomy and axillary nodes to be addressed with which she followed through and on 04-03-02, Dr. Annamaria [name] performed a right partial mastectomy and a right axillary lymph node dissection.  Final pathology revealed a 2.4 cm. high grade, Grade III invasive ductal carcinoma with an adjacent .8 cm. also high grade invasive ductal carcinoma which was felt to represent an intramammary metastasis rather than a second primary.  A smaller mass was just medial to the larger mass.  The smaller mass was associated with high grade DCIS component.  There was no definite lymphovascular invasion identified.  However, one of fourteen axillary lymph nodes did contain a 1.5 cm. metastatic  deposit.  Postoperatively she did very well.  We reiterated the fact that she did need adjuvant chemotherapy, however, she refused and decided that she would pursue radiation.  She received radiation and completed that on 08-14-02. Her subsequent history is as detailed below.   INTERVAL HISTORY: [name] returns today with her caregiver for follow-up of [name]'s angiosarcoma of the right upper extremity and breast cancer. [name] is being treated with Abraxane, and trastuzumab/pertuzumab. Most of the symptoms that she complains about related to her treatment are due to the pertuzumab and she is unwilling to receive that any further. In addition though she was scheduled for treatment today she tells me she has to go to [place] to help her children because her ex-husband is now disabled and incapacitated. Her treatment today accordingly is being postponed  REVIEW OF SYSTEMS: [name] had some mouth sores. She did not let us know about that before. She was seen at the wound clinic and she says they initially thought she might have MRSA but now they think possibly what she has on her upper right back is related to radiation. She does have significant back pain. She says the fentanyl patch "falls off" and doesn't work for her. She is using mostly oxycodone 15 mg tablets. She appears to have run out of the OxyContin 20 mg tablets. She denies diarrhea or constipation. She describes itself was forgetful, anxious, depressed, and weak. A detailed review of systems today was otherwise stable  PAST MEDICAL HISTORY: Past Medical History:  Diagnosis Date  . Breast cancer ([name])   . DVT (deep venous thrombosis) (  [facility]) 10/07/2011  . Hypertension   . Radiation 11/29/2005-12/29/2005   right supraclavicular area 4147 cGy  . Radiation 06/26/02-08/14/02   right breast 5040 cGy, tumor bed boosted to 1260 cGy  . Rheumatic fever     PAST SURGICAL HISTORY: Past Surgical History:  Procedure Laterality Date  .  BREAST SURGERY    . MASTECTOMY     bilateral  . PORT A CATH REVISION      FAMILY HISTORY Family History  Problem Relation Age of Onset  . Pancreatic cancer Mother   . Kidney cancer Sister 54  . Breast cancer Sister 60  . Breast cancer Other 33  The patient's father died in his 80s  from complications of alcohol abuse. The patient's mother died in her 50s from pancreatic cancer. The patient has 6 brothers, 2 sisters. Both her sisters have had breast cancer, both diagnosed after the age of 50. The patient has not had genetic testing so far.   GYNECOLOGIC HISTORY: Menarche age 12; first live birth age 18. She is GXP4. She is not sure when she stopped having periods. She never used hormone replacement.  SOCIAL HISTORY: The patient has worked in the past as a nursing assistant. At home in addition to the patient are her husband [name], originally from [place], who works as a truck driver.  Also living in the home is her son [name] and her daughter [name] (she is studying to be a nurse).  The other 2 children are [name], who has a college degree in psychology and works as a writer; and [name], who lives in [place] and works as a policeman.   ADVANCED DIRECTIVES: Not in place  HEALTH MAINTENANCE: Social History  Substance Use Topics  . Smoking status: Light Tobacco Smoker  . Smokeless tobacco: Never Used  . Alcohol use Yes     Comment: Rarely     Colonoscopy:  Not on file  PAP:  Not on file  Bone density:  Not on file  Lipid panel:  Not on file    Allergies  Allergen Reactions  . Tramadol Nausea Only  . Hydrocodone-Acetaminophen Itching and Rash    Current Outpatient Prescriptions  Medication Sig Dispense Refill  . alprazolam (XANAX) 2 MG tablet Take 2 mg by mouth daily.     . Ascorbic Acid (VITAMIN C) 100 MG tablet Take 100 mg by mouth daily.      . calcium-vitamin D (OSCAL WITH D) 500-200 MG-UNIT per tablet Take 1 tablet by mouth daily.      .  dextromethorphan-guaiFENesin (MUCINEX DM) 30-600 MG 12hr tablet Take 1 tablet by mouth 2 (two) times daily. 14 tablet 0  . diphenoxylate-atropine (LOMOTIL) 2.5-0.025 MG tablet Take 1 tablet by mouth 4 (four) times daily as needed for diarrhea or loose stools. 60 tablet 1  . gabapentin (NEURONTIN) 300 MG capsule Take 1 capsule (300 mg total) by mouth at bedtime. 90 capsule 4  . HYDROcodone-homatropine (HYCODAN) 5-1.5 MG/5ML syrup Take 5 mLs by mouth every 6 (six) hours as needed for cough. 120 mL 0  . Multiple Vitamin (MULTIVITAMIN) capsule Take 1 capsule by mouth daily.      . ondansetron (ZOFRAN) 8 MG tablet TAKE ONE TABLET BY MOUTH TWICE DAILY AS NEEDED FOR NAUSEA AND VOMITING 30 tablet 1  . oxyCODONE (OXYCONTIN) 20 mg 12 hr tablet Take 1 tablet (20 mg total) by mouth every 12 (twelve) hours. 40 tablet 0  . oxyCODONE (ROXICODONE) 15 MG immediate release tablet Take 2 tablets (30   mg total) by mouth every 3 (three) hours as needed for pain. 100 tablet 0  . predniSONE (DELTASONE) 5 MG tablet Take 2 tablets (10 mg total) by mouth daily with breakfast. 60 tablet 6  . prochlorperazine (COMPAZINE) 10 MG tablet Take 1 tablet (10 mg total) by mouth every 6 (six) hours as needed (Nausea or vomiting). 30 tablet 1  . valACYclovir (VALTREX) 500 MG tablet Take 1 tablet (500 mg total) by mouth 2 (two) times daily. 30 tablet 12   No current facility-administered medications for this visit.    Facility-Administered Medications Ordered in Other Visits  Medication Dose Route Frequency Provider Last Rate Last Dose  . sodium chloride 0.9 % injection 10 mL  10 mL Intravenous PRN Chauncey Cruel, MD   10 mL at 03/04/14 1421    OBJECTIVE: Middle-aged African American woman   Vitals:   07/19/16 1049  BP: (!) 123/55  Pulse: 65  Resp: 18  Temp: 98.4 F (36.9 C)     Body mass index is 26.58 kg/m.    ECOG FS: 2 Filed Weights   07/19/16 1049  Weight: 159 lb 11.2 oz (72.4 kg)    Sclerae unicteric, EOMs  intact Oropharynx clear and moist No cervical or supraclavicular adenopathy Lungs no rales or rhonchi Heart regular rate and rhythm Abd soft, nontender, positive bowel sounds MSK no focal spinal tenderness Neuro: nonfocal, well oriented, appropriate affect Breasts: Deferred Skin: Images of a right upper extremity in her upper back are pasted below  Right upper extremity 07/19/2016    Upper right back 07/19/2016.    LAB RESULTS:.  Lab Results  Component Value Date   WBC 7.3 07/19/2016   NEUTROABS 4.3 07/19/2016   HGB 12.0 07/19/2016   HCT 38.2 07/19/2016   MCV 82.1 07/19/2016   PLT 329 07/19/2016      Chemistry      Component Value Date/Time   NA 140 07/19/2016 1013   K 4.0 07/19/2016 1013   CL 100 08/14/2014 0100   CL 101 03/07/2013 1411   CO2 30 (H) 07/19/2016 1013   BUN 5.9 (L) 07/19/2016 1013   CREATININE 0.8 07/19/2016 1013      Component Value Date/Time   CALCIUM 9.6 07/19/2016 1013   ALKPHOS 82 07/19/2016 1013   AST 20 07/19/2016 1013   ALT 13 07/19/2016 1013   BILITOT <0.30 07/19/2016 1013       STUDIES: No results found.   ASSESSMENT:  60 y.o. [place] woman with stage IV breast cancer manifested chiefly by loco-regional nodal disease (neck, chest), without lung, liver, bone, or brain involvement.  (1) Status post right upper inner quadrant lumpectomy and sentinel lymph node sampling 03/04/2002 for 2 separate foci of invasive ductal carcinoma, mpT2 pN1 or stage IIB, both foci grade 3, both estrogen and progesterone receptor positive, both HercepTest 3+, Mib-1 56%  (2) Reexcision for margins 05/27/2002 showed no residual cancer in the breast.  (3) The patient refused adjuvant systemic therapy.  (4) Adjuvant radiation treatment completed 08/14/2002.  (5) recurrence in the right breast in 02/2004 showing a morphologically different tumor, again grade 3, again estrogen and progesterone receptor negative, with an MIB-1 of 14% and Herceptest  3+.  (5) Between 03/2004 and 07/2004 she received dose dense Doxorubicin/Cyclophosphamide x 4 given with trastuzumab, followed by weekly Docetaxel x 8, again given with trastuzumab.  (6) Right mastectomy 07/13/2004 showed scattered microscopic foci of residual disease over an area  greater than 5 cm. Margins were negative.  (  7) Postoperative Docetaxel continued until 09/2004.  (8) Trastuzumab (Herceptin) given 08/2004 through 01/2012 with some brief interruptions.  (9) Isolated right cervical nodal recurrence 10/2005, treated with radiation to the right supraclavicular area (total 41.5 gray) completed 12/29/2005.  (10) Navelbine given together with Herceptin  11/2005 through 03/2006.  (9) Left mastectomy 02/13/2006 for ductal carcinoma in situ, grade 2, estrogen and progesterone receptor negative, with negative margins; 0 of 3 lymph nodes involved  (10) treated with Lapatinib and Capecitabine before 10/2009, for an unclear duration and with unclear results (cannot locate data on chart review).  (11) Status post right supraclavicular lymph node biopsy 09/2010 again positive for an invasive ductal carcinoma, estrogen and progesterone receptor negative, HER-2 positive by C[facility] with a ratio 4.25.  (12) Navelbine given together with Herceptin between 05/2011 and 11/2011.  (13) Carboplatin/ Gemcitabine/ Herceptin given for 2 cycles, in 12/2011 and 01/2012.  (14) TDM-1 (Kadcyla) started 02/2012. Last dose 10/02/2013 after which the patient discontinued treatment at her own discretion. Echo on December 2014 showed a well preserved ejection fraction.  (15) Deep vein thrombosis of the right upper extremity documented 04/20/2011.  She completed anticoagulant therapy with Coumadin on 03/25/2013.  (16) Chronic right upper extremity lymphedema, not responsive to aggressive PT  (a) biopsy of denuded area 04/23/2015 read as dermatofibroma (discordant)  (b) deeper cuts of 04/23/2015 biopsy suggest  angiosarcoma  (17) Right chest port-a-cath removal due to infection on 01/28/2013. Left chest Port-A-Cath placed on 04/08/2013; being flushed every 6 weeks  RIGHT UPPER EXTREMITY ANGIOSARCOMA VS BREAST CANCER: (18) treated at [facility] [place] August 2016 with paclitaxel, trastuzumab and pertuzumab 1 cycle, afterwards referred to hospice  (19) under the care of hospice of [facility] August 2016 05/08/2016, when the patient opted for a second try at chemotherapy  (20) started low-dose Abraxane, trastuzumab and pertuzumab 06/07/2016, to be repeated every 3 weeks.   (a) pretreatment echocardiogram 06/06/2016 showed a 60-65% ejection fraction  (b) pertuzumab discontinued beginning September 2017 because of poor tolerance   PLAN: Although Eri's wounds look so terrible, and in fact they are, nevertheless there has been a clear improvement with her current treatment. I urged her to go ahead and get treated today since I don't think that would compromise her ability to help her family in [place]. Nevertheless she very much did not want to be treated today but then agreed to return in 2 weeks or 3 depending on how long it takes her over there and we will proceed with treatment at that time.  At the next visit also we will do our best to clarify her healthcare part of attorney situation.  Her pain control seems to be adequate although it has been somewhat chaotic. Apparently some family members took some of her medication or at least the medication could not be found. We asked her to call the police to report this and she did so. We refilled her medicine but now she appears to have run out of the 20 mg OxyContin tablets.  I refilled both the 20 mg OxyContin and the 15 mg oxycodone tablets so she would have enough she goes to [place]. She is doing well as far as the bowel prophylactic regimen is concerned.  She knows to call for any problem that may develop before her next visit  here.    Chauncey Cruel, MD  07/19/2016 8:22 PM

## 2016-07-19 NOTE — Telephone Encounter (Signed)
"  I'd like to know when my appointment is."  Today's appointments begin at 10:00.

## 2016-07-19 NOTE — Patient Instructions (Signed)

## 2016-07-22 ENCOUNTER — Encounter: Payer: Commercial Managed Care - PPO | Attending: Surgery | Admitting: Surgery

## 2016-07-22 DIAGNOSIS — F17210 Nicotine dependence, cigarettes, uncomplicated: Secondary | ICD-10-CM | POA: Insufficient documentation

## 2016-07-22 DIAGNOSIS — Z853 Personal history of malignant neoplasm of breast: Secondary | ICD-10-CM | POA: Diagnosis not present

## 2016-07-22 DIAGNOSIS — S21201A Unspecified open wound of right back wall of thorax without penetration into thoracic cavity, initial encounter: Secondary | ICD-10-CM | POA: Diagnosis not present

## 2016-07-22 DIAGNOSIS — I972 Postmastectomy lymphedema syndrome: Secondary | ICD-10-CM | POA: Insufficient documentation

## 2016-07-22 DIAGNOSIS — I890 Lymphedema, not elsewhere classified: Secondary | ICD-10-CM | POA: Diagnosis not present

## 2016-07-22 DIAGNOSIS — C4911 Malignant neoplasm of connective and soft tissue of right upper limb, including shoulder: Secondary | ICD-10-CM | POA: Diagnosis present

## 2016-07-22 DIAGNOSIS — L98491 Non-pressure chronic ulcer of skin of other sites limited to breakdown of skin: Secondary | ICD-10-CM | POA: Diagnosis not present

## 2016-07-22 DIAGNOSIS — S41101A Unspecified open wound of right upper arm, initial encounter: Secondary | ICD-10-CM | POA: Diagnosis not present

## 2016-07-22 DIAGNOSIS — X58XXXA Exposure to other specified factors, initial encounter: Secondary | ICD-10-CM | POA: Insufficient documentation

## 2016-07-22 DIAGNOSIS — S2190XA Unspecified open wound of unspecified part of thorax, initial encounter: Secondary | ICD-10-CM | POA: Diagnosis not present

## 2016-07-22 NOTE — Telephone Encounter (Signed)
No entry 

## 2016-07-23 NOTE — Progress Notes (Signed)
JNAI, FAUVER ([id number]) Visit Report for 07/22/2016 Chief Complaint Document Details Patient Name: [name], [name] Date of Service: 07/22/2016 3:30 PM Medical Record Patient Account Number: [id number] Number: Afful, RN, BSN, Treating RN: January 29, 1956 (59 y.o. Velva Harman Date of Birth/Sex: Female) Other Clinician: Primary Care Physician: [name] Treating [name] Referring Physician: [name] Physician/Extender: Suella Grove in Treatment: 3 Information Obtained from: Patient Chief Complaint Patient presents to the wound care center for a consult due non healing wound. The patient was known to have bilateral lymphedema as a status post mastectomy syndrome now comes with an ulcerated area of her right upper arm and back which she's had for several weeks. Electronic Signature(s) Signed: 07/22/2016 4:08:02 PM By: [name], FACS Entered By: [name] on 07/22/2016 16:08:01 [name] ([id number]) -------------------------------------------------------------------------------- HPI Details Patient Name: [name] Date of Service: 07/22/2016 3:30 PM Medical Record Patient Account Number: [id number] Number: Afful, RN, BSN, Treating RN: [birth year] (60 y.o. Velva Harman Date of Birth/Sex: Female) Other Clinician: Primary Care Physician: [name] Treating [name] Referring Physician: [name] Physician/Extender: Suella Grove in Treatment: 3 History of Present Illness Location: bilateral arm lymphedema with now skin ulceration of the right upper extremity. Quality: Patient reports experiencing a dull pain to affected area(s). Severity: Patient states wound are getting worse. Duration: Patient has had the wound for > 18 months prior to seeking treatment at the wound center Timing: Pain in wound is Intermittent (comes and goes Context: The wound occurred when the patiento. Modifying Factors: Consults to this date include: local care and  antibiotics as prescribed by the PCP and the ER. Associated Signs and Symptoms: Patient reports having increase discharge. HPI Description: 60 year old patient known to [name] from a prior visit about a year ago, returns for care. she is known to have stage IV breast cancer, angiosarcoma and has been on treatment with the medical oncologist Dr. Jana Hakim. She has been seeing the hospice nurses earlier and has been continuing with chemotherapy. She was known to have stage IV breast cancer, originally diagnosed in April 2003 and now manifested chiefly by loco-regional nodal disease (neck, chest), without lung, liver, bone, or brain involvement. he has received extensive radiation and chemotherapy after the surgeries. She's always had bilateral lymphedema of her arms but now comes with an ulcerated area on her right upper arm posteriorly. Her medical Oncologist, Dr.Gustav [name], was concerned that what we may be seeing in Avey's right upper extremity is actually skin spread of her cancer. This needs to be biopsied and she was sending her back to general surgery with that in mind. She placed a referral to the wound clinic . She has been on doxycycline for now. The general surgeon saw her 2 days ago and did a biopsy and this report is pending. In April 2016 she had a right upper arm venous duplex study which showed: No evidence of deep vein or superficial thrombosis involving the ooright upper extremity and left subclavian vein. Moderate edema noted throughout the right upper extremity. 05/01/2015 the patient tells me that her surgeon revealed that her biopsy was negative and that there was no pathology found. Other than that she is doing well and she thinks her edema has gone down a bit. I had done a biopsy for her about a year ago and final report is not back yet but the pathologist Dr. Chrystine Oiler did speak to me on the phone a few days ago and mentioned that he is doing final stains and there may  be  a mixture of a angiosarcoma and metastatic breast cancer. I have not been able to speak to Dr. Jana Hakim yet but I understand the patient has seen him today and he has discussed the provisional pathology and offered her some chemotherapy. The final pathology report is back and it shows a metastatic breast carcinoma and an atypical vascular proliferation. 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 She informs me that she is going to [facility] to the [facility] for further care. TAMIIA, LEBECK ([id number]) Electronic Signature(s) Signed: 07/22/2016 4:08:08 PM By: [name], FACS Entered By: [name] on 07/22/2016 16:08:08 [name] ([id number]) -------------------------------------------------------------------------------- Physical Exam Details Patient Name: [name] Date of Service: 07/22/2016 3:30 PM Medical Record Patient Account Number: [id number] Number: Afful, RN, BSN, Treating RN: 18-Oct-1956 (60 y.o. Velva Harman Date of Birth/Sex: Female) Other Clinician: Primary Care Physician: [name] Treating [name] Referring Physician: [name] Physician/Extender: Weeks in Treatment: 3 Constitutional . Pulse regular. Respirations normal and unlabored. Afebrile. . Eyes Nonicteric. Reactive to light. Ears, Nose, Mouth, and Throat Lips, teeth, and gums WNL.Marland Kitchen Moist mucosa without lesions. Neck supple and nontender. No palpable supraclavicular or cervical adenopathy. Normal sized without goiter. Respiratory WNL. No retractions.. Cardiovascular Pedal Pulses WNL. No clubbing, cyanosis or edema. Lymphatic No adneopathy. No adenopathy. No adenopathy. Musculoskeletal Adexa without tenderness or enlargement.. Digits and nails w/o clubbing, cyanosis, infection, petechiae, ischemia, or inflammatory conditions.. Integumentary (Hair, Skin) No suspicious lesions. No crepitus or fluctuance. No peri-wound warmth or erythema. No  masses.Marland Kitchen Psychiatric Judgement and insight Intact.. No evidence of depression, anxiety, or agitation.. Notes wounds in the lymphedema of right upper extremity are looking much better than the few open areas less necrotic and fungating less. There is a large area with some necrotic debris on her back. No sharp debridement was recommended Electronic Signature(s) Signed: 07/22/2016 4:09:04 PM By: [name], FACS Entered By: [name] on 07/22/2016 16:09:03 [name] ([id number]) -------------------------------------------------------------------------------- Physician Orders Details Patient Name: [name] Date of Service: 07/22/2016 3:30 PM Medical Record Number: [id number] Patient Account Number: [id number] Date of Birth/Sex: 1956-02-01 (60 y.o. Female) Treating RN: Montey Hora Primary Care Physician: [name] Other Clinician: Referring Physician: [name] Treating Physician/Extender: Frann Rider in Treatment: 3 Verbal / Phone Orders: Yes Clinician: Montey Hora Read Back and Verified: Yes Diagnosis Coding Wound Cleansing Wound #2 Right,Circumferential Upper Arm o Cleanse wound with mild soap and water o May Shower, gently pat wound dry prior to applying new dressing. o May shower with protection. Primary Wound Dressing Wound #2 Right,Circumferential Upper Arm o Aquacel Ag Wound #3 Right,Proximal Back o Aquacel Ag Secondary Dressing Wound #2 Right,Circumferential Upper Arm o ABD pad Wound #3 Right,Proximal Back o ABD pad Dressing Change Frequency Wound #2 Right,Circumferential Upper Arm o Change dressing every day. Wound #3 Right,Proximal Back o Change dressing every day. Follow-up Appointments Wound #2 Right,Circumferential Upper Arm o Return Appointment in: - 3 weeks Wound #3 Right,Proximal Back o Return Appointment in: - 3 weeks Additional Orders / Instructions Wound #2 Right,Circumferential  Upper Arm Byrom, Ardenia ([id number]) o Increase protein intake. o Activity as tolerated Wound #3 Right,Proximal Back o Increase protein intake. o Activity as tolerated Electronic Signature(s) Signed: 07/22/2016 4:41:20 PM By: [name], FACS Signed: 07/22/2016 5:02:11 PM By: Montey Hora Entered By: Montey Hora on 07/22/2016 15:54:24 [name] ([id number]) -------------------------------------------------------------------------------- Problem List Details Patient Name: [name] Date of Service: 07/22/2016 3:30 PM Medical Record Patient Account Number: [id number] Number: Afful, RN,  BSN, Treating RN: 11-17-1955 (60 y.o. Velva Harman Date of Birth/Sex: Female) Other Clinician: Primary Care Physician: [name] Treating [name] Referring Physician: [name] Physician/Extender: Suella Grove in Treatment: 3 Active Problems ICD-10 Encounter Code Description Active Date Diagnosis Z85.3 Personal history of malignant neoplasm of breast 06/30/2016 Yes C49.11 Malignant neoplasm of connective and soft tissue of right 06/30/2016 Yes upper limb, including shoulder S41.101A Unspecified open wound of right upper arm, initial 06/30/2016 Yes encounter S21.90XA Unspecified open wound of unspecified part of thorax, 06/30/2016 Yes initial encounter 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 Velva Harman Date of Birth/Sex: Female) Other Clinician: Primary Care Physician:  [name] Treating [name] Referring Physician: [name] Physician/Extender: Suella Grove in Treatment: 3 Subjective Chief Complaint Information obtained from Patient Patient presents to the wound care center for a consult due non healing wound. The patient was known to have bilateral lymphedema as a status post mastectomy syndrome now comes with an ulcerated area of her right upper arm and back which she's had for several weeks. History of Present Illness (HPI) The following HPI elements were documented for the patient's wound: Location: bilateral arm lymphedema with now skin ulceration of the right upper extremity. Quality: Patient reports experiencing a dull pain to affected area(s). Severity: Patient states wound are getting worse. Duration: Patient has had the wound for > 18 months prior to seeking treatment at the wound center Timing: Pain in wound is Intermittent (comes and goes Context: The wound occurred when the patient . Modifying Factors: Consults to this date include: local care and antibiotics as prescribed by the PCP and the ER. Associated Signs and Symptoms: Patient reports having increase discharge. 60 year old patient known to [name] from a prior visit about a year ago, returns for care. she is known to have stage IV breast cancer, angiosarcoma and has been on treatment with the medical oncologist Dr. Jana Hakim. She has been seeing the hospice nurses earlier and has been continuing with chemotherapy. She was known to have stage IV breast cancer, originally diagnosed in April 2003 and now manifested chiefly by loco-regional nodal disease (neck, chest), without lung, liver, bone, or brain involvement. he has received extensive radiation and chemotherapy after the surgeries. She's always had bilateral lymphedema of her arms but now comes with an ulcerated area on her right upper arm posteriorly. Her medical Oncologist, Dr.Gustav [name], was concerned that what  we may be seeing in Velva's right upper extremity is actually skin spread of her cancer. This needs to be biopsied and she was sending her back to general surgery with that in mind. She placed a referral to the wound clinic . She has been on doxycycline for now. The general surgeon saw her 2 days ago and did a biopsy and this report is pending. In April 2016 she had a right upper arm venous duplex study which showed: No evidence of deep vein or superficial thrombosis involving the right upper extremity and left subclavian vein. Moderate edema noted throughout the right upper extremity. 05/01/2015 the patient tells me that her surgeon revealed that her biopsy was negative and that there was no pathology found. Other than that she is doing well and she thinks her edema  has gone down a bit. JAYVA, SZYMKOWIAK ([id number]) I had done a biopsy for her about a year ago and final report is not back yet but the pathologist Dr. Chrystine Oiler did speak to me on the phone a few days ago and mentioned that he is doing final stains and there may be a mixture of a angiosarcoma and metastatic breast cancer. I have not been able to speak to Dr. Jana Hakim yet but I understand the patient has seen him today and he has discussed the provisional pathology and offered her some chemotherapy. The final pathology report is back and it shows a metastatic breast carcinoma and an atypical vascular proliferation. 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 She informs me that she is going to [facility] to the [place] for further care. Objective Constitutional Pulse regular. Respirations normal and unlabored. Afebrile. Vitals Time Taken: 3:27 PM, Height: 65 in, Weight: 159.2 lbs, BMI: 26.5, Temperature: 98.1 F, Pulse: 79 bpm, Respiratory Rate: 20 breaths/min, Blood Pressure: 120/52 mmHg. Eyes Nonicteric. Reactive to light. Ears, Nose, Mouth, and Throat Lips, teeth, and gums WNL.Marland Kitchen Moist mucosa without  lesions. Neck supple and nontender. No palpable supraclavicular or cervical adenopathy. Normal sized without goiter. Respiratory WNL. No retractions.. Cardiovascular Pedal Pulses WNL. No clubbing, cyanosis or edema. Lymphatic No adneopathy. No adenopathy. No adenopathy. Musculoskeletal Adexa without tenderness or enlargement.. Digits and nails w/o clubbing, cyanosis, infection, petechiae, ischemia, or inflammatory conditions.Marland Kitchen Psychiatric Judgement and insight Intact.. No evidence of depression, anxiety, or agitation.. General Notes: wounds in the lymphedema of right upper extremity are looking much better than the few [place], Nelson ([id number]) open areas less necrotic and fungating less. There is a large area with some necrotic debris on her back. No sharp debridement was recommended Integumentary (Hair, Skin) No suspicious lesions. No crepitus or fluctuance. No peri-wound warmth or erythema. No masses.. Wound #2 status is Open. Original cause of wound was Gradually Appeared. The wound is located on the Right,Circumferential Upper Arm. The wound measures 12cm length x 5cm width x 0.1cm depth; 47.124cm^2 area and 4.712cm^3 volume. The wound is limited to skin breakdown. There is no tunneling or undermining noted. There is a large amount of serosanguineous drainage noted. The wound margin is distinct with the outline attached to the wound base. There is large (67-100%) red granulation within the wound bed. There is a small (1-33%) amount of necrotic tissue within the wound bed including Adherent Slough. The periwound skin appearance did not exhibit: Callus, Crepitus, Excoriation, Fluctuance, Friable, Induration, Localized Edema, Rash, Scarring, Dry/Scaly, Maceration, Moist, Atrophie Blanche, Cyanosis, Ecchymosis, Hemosiderin Staining, Mottled, Pallor, Rubor, Erythema. Periwound temperature was noted as No Abnormality. The periwound has tenderness on palpation. Wound #3 status is Open.  Original cause of wound was Gradually Appeared. The wound is located on the Right,Proximal Back. The wound measures 12cm length x 13cm width x 0.2cm depth; 122.522cm^2 area and 24.504cm^3 volume. The wound is limited to skin breakdown. There is no tunneling or undermining noted. There is a large amount of serosanguineous drainage noted. The wound margin is distinct with the outline attached to the wound base. There is no granulation within the wound bed. There is a large (67- 100%) amount of necrotic tissue within the wound bed including Adherent Slough. The periwound skin appearance exhibited: Moist. The periwound skin appearance did not exhibit: Callus, Crepitus, Excoriation, Fluctuance, Friable, Induration, Localized Edema, Rash, Scarring, Dry/Scaly, Maceration, Atrophie Blanche, Cyanosis, Ecchymosis, Hemosiderin Staining, Mottled, Pallor, Rubor, Erythema. Periwound temperature was noted as No  Abnormality. The periwound has tenderness on palpation. Assessment Active Problems ICD-10 Z85.3 - Personal history of malignant neoplasm of breast C49.11 - Malignant neoplasm of connective and soft tissue of right upper limb, including shoulder S41.101A - Unspecified open wound of right upper arm, initial encounter S21.90XA - Unspecified open wound of unspecified part of thorax, initial encounter I97.2 - Postmastectomy lymphedema syndrome Plan Wound Cleansing: Sackrider, Milah ([id number]) Wound #2 Right,Circumferential Upper Arm: Cleanse wound with mild soap and water May Shower, gently pat wound dry prior to applying new dressing. May shower with protection. Primary Wound Dressing: Wound #2 Right,Circumferential Upper Arm: Aquacel Ag Wound #3 Right,Proximal Back: Aquacel Ag Secondary Dressing: Wound #2 Right,Circumferential Upper Arm: ABD pad Wound #3 Right,Proximal Back: ABD pad Dressing Change Frequency: Wound #2 Right,Circumferential Upper Arm: Change dressing every day. Wound #3  Right,Proximal Back: Change dressing every day. Follow-up Appointments: Wound #2 Right,Circumferential Upper Arm: Return Appointment in: - 3 weeks Wound #3 Right,Proximal Back: Return Appointment in: - 3 weeks Additional Orders / Instructions: Wound #2 Right,Circumferential Upper Arm: Increase protein intake. Activity as tolerated Wound #3 Right,Proximal Back: Increase protein intake. Activity as tolerated We will continue with supportive wound care and I have recommended: 1. alginate to be placed over all the wounds and covered with a bordered foam and tape. 2. She may shower or wash with soap and water and then reapply her dressing on alternate days 3. she is here for palliative care and we will see her in 3-4 weeks time Electronic Signature(s) Signed: 07/22/2016 4:10:51 PM By: [name], FACS Entered By: [name] on 07/22/2016 16:10:51 [name] ([id number]) -------------------------------------------------------------------------------- SuperBill Details Patient Name: [name] Date of Service: 07/22/2016 Medical Record Patient Account Number: [id number] Number: Afful, RN, BSN, Treating RN: [birth year] (60 y.o. Velva Harman Date of Birth/Sex: Female) Other Clinician: Primary Care Physician: [name] Treating Rodell Marrs Referring Physician: [name] Physician/Extender: Suella Grove in Treatment: 3 Diagnosis Coding ICD-10 Codes Code Description Z85.3 Personal history of malignant neoplasm of breast C49.11 Malignant neoplasm of connective and soft tissue of right upper limb, including shoulder S41.101A Unspecified open wound of right upper arm, initial encounter S21.90XA Unspecified open wound of unspecified part of thorax, initial encounter I97.2 Postmastectomy lymphedema syndrome Facility Procedures CPT4 Code: [id number] Description: 99213 - WOUND CARE VISIT-LEV 3 EST PT Modifier: Quantity: 1 Physician Procedures CPT4: Description  Modifier Quantity Code [id number] 99213 - WC PHYS LEVEL 3 - EST PT 1 ICD-10 Description Diagnosis Z85.3 Personal history of malignant neoplasm of breast C49.11 Malignant neoplasm of connective and soft tissue of right upper limb, including  shoulder S41.101A Unspecified open wound of right upper arm, initial encounter I97.2 Postmastectomy lymphedema syndrome Electronic Signature(s) Signed: 07/22/2016 4:11:13 PM By: [name], FACS Entered By: [name] on 07/22/2016 16:11:13

## 2016-07-23 NOTE — Progress Notes (Signed)
[name], [place] ([id number]) Visit Report for 07/22/2016 Arrival Information Details Patient Name: [name], [name] Date of Service: 07/22/2016 3:30 PM Medical Record Number: [id number] Patient Account Number: [id number] Date of Birth/Sex: [birth year] (60 y.o. Female) Treating RN: Afful, RN, BSN, Velva Harman Primary Care Physician: [name] Other Clinician: Referring Physician: Ricke [name] Treating Physician/Extender: [name] in Treatment: 3 Visit Information History Since Last Visit All ordered tests and consults were completed: No Patient Arrived: Ambulatory Added or deleted any medications: No Arrival Time: 15:26 Any new allergies or adverse reactions: No Accompanied By: self Had a fall or experienced change in No Transfer Assistance: None activities of daily living that may affect Patient Identification Verified: Yes risk of falls: Secondary Verification Process Yes Signs or symptoms of abuse/neglect since last No Completed: visito Patient Requires Transmission- No Has Dressing in Place as Prescribed: Yes Based Precautions: Pain Present Now: No Patient Has Alerts: Yes Patient Alerts: NO BP in [place] Signature(s) Signed: 07/22/2016 4:12:23 PM By: Regan Lemming BSN, RN Entered By: Regan Lemming on 07/22/2016 15:27:33 [name] ([id number]) -------------------------------------------------------------------------------- Clinic Level of Care Assessment Details Patient Name: [name] Date of Service: 07/22/2016 3:30 PM Medical Record Number: [id number] Patient Account Number: [id number] Date of Birth/Sex: 08-01-1956 (60 y.o. Female) Treating RN: Montey Hora Primary Care Physician: [name] Other Clinician: Referring Physician: Ricke [name] Treating Physician/Extender: [name] in Treatment: 3 Clinic Level of Care Assessment Items TOOL 4 Quantity Score []  - Use when only an EandM is performed on FOLLOW-UP visit  0 ASSESSMENTS - Nursing Assessment / Reassessment X - Reassessment of Co-morbidities (includes updates in patient status) 1 10 X - Reassessment of Adherence to Treatment Plan 1 5 ASSESSMENTS - Wound and Skin Assessment / Reassessment []  - Simple Wound Assessment / Reassessment - one wound 0 X - Complex Wound Assessment / Reassessment - multiple wounds 1 5 []  - Dermatologic / Skin Assessment (not related to wound area) 0 ASSESSMENTS - Focused Assessment []  - Circumferential Edema Measurements - multi extremities 0 []  - Nutritional Assessment / Counseling / Intervention 0 []  - Lower Extremity Assessment (monofilament, tuning fork, pulses) 0 []  - Peripheral Arterial Disease Assessment (using hand held doppler) 0 ASSESSMENTS - Ostomy and/or Continence Assessment and Care []  - Incontinence Assessment and Management 0 []  - Ostomy Care Assessment and Management (repouching, etc.) 0 PROCESS - Coordination of Care X - Simple Patient / Family Education for ongoing care 1 15 []  - Complex (extensive) Patient / Family Education for ongoing care 0 []  - Staff obtains Programmer, systems, Records, Test Results / Process Orders 0 []  - Staff telephones HHA, Nursing Homes / Clarify orders / etc 0 []  - Routine Transfer to another Facility (non-emergent condition) 0 [name], [name] ([id number]) []  - Routine Hospital Admission (non-emergent condition) 0 []  - New Admissions / Biomedical engineer / Ordering NPWT, Apligraf, etc. 0 []  - Emergency Hospital Admission (emergent condition) 0 X - Simple Discharge Coordination 1 10 []  - Complex (extensive) Discharge Coordination 0 PROCESS - Special Needs []  - Pediatric / Minor Patient Management 0 []  - Isolation Patient Management 0 []  - Hearing / Language / Visual special needs 0 []  - Assessment of Community assistance (transportation, D/C planning, etc.) 0 []  - Additional assistance / Altered mentation 0 []  - Support Surface(s) Assessment (bed, cushion, seat, etc.)  0 INTERVENTIONS - Wound Cleansing / Measurement []  - Simple Wound Cleansing - one wound 0 X - Complex Wound Cleansing - multiple wounds 2 5 X - Wound Imaging (photographs -  any number of wounds) 1 5 []  - Wound Tracing (instead of photographs) 0 []  - Simple Wound Measurement - one wound 0 X - Complex Wound Measurement - multiple wounds 2 5 INTERVENTIONS - Wound Dressings []  - Small Wound Dressing one or multiple wounds 0 X - Medium Wound Dressing one or multiple wounds 2 15 []  - Large Wound Dressing one or multiple wounds 0 []  - Application of Medications - topical 0 []  - Application of Medications - injection 0 INTERVENTIONS - Miscellaneous []  - External ear exam 0 [name] ([id number]) []  - Specimen Collection (cultures, biopsies, blood, body fluids, etc.) 0 []  - Specimen(s) / Culture(s) sent or taken to Lab for analysis 0 []  - Patient Transfer (multiple staff / Harrel Lemon Lift / Similar devices) 0 []  - Simple Staple / Suture removal (25 or less) 0 []  - Complex Staple / Suture removal (26 or more) 0 []  - Hypo / Hyperglycemic Management (close monitor of Blood Glucose) 0 []  - Ankle / Brachial Index (ABI) - do not check if billed separately 0 X - Vital Signs 1 5 Has the patient been seen at the hospital within the last three years: Yes Total Score: 105 Level Of Care: New/Established - Level 3 Electronic Signature(s) Signed: 07/22/2016 5:02:11 PM By: Montey Hora Entered By: Montey Hora on 07/22/2016 15:55:38 [name] ([id number]) -------------------------------------------------------------------------------- Encounter Discharge Information Details Patient Name: [name] Date of Service: 07/22/2016 3:30 PM Medical Record Number: [id number] Patient Account Number: [id number] Date of Birth/Sex: 1956-06-25 (60 y.o. Female) Treating RN: Baruch Gouty, RN, BSN, Velva Harman Primary Care Physician: [name] Other Clinician: Referring Physician: Ricke [name] Treating  Physician/Extender: [name] in Treatment: 3 Encounter Discharge Information Items Discharge Pain Level: 0 Discharge Condition: Stable Ambulatory Status: Ambulatory Discharge Destination: Home Transportation: Private Auto Accompanied By: self Schedule Follow-up Appointment: No Medication Reconciliation completed and provided to Patient/Care No Armanii Pressnell: Provided on Clinical Summary of Care: 07/22/2016 Form Type Recipient Paper Patient MF Electronic Signature(s) Signed: 07/22/2016 4:11:14 PM By: Regan Lemming BSN, RN Previous Signature: 07/22/2016 4:04:02 PM Version By: Ruthine Dose Entered By: Regan Lemming on 07/22/2016 16:11:14 [name] ([id number]) -------------------------------------------------------------------------------- Lower Extremity Assessment Details Patient Name: [name] Date of Service: 07/22/2016 3:30 PM Medical Record Number: [id number] Patient Account Number: [id number] Date of Birth/Sex: Apr 18, 1956 (60 y.o. Female) Treating RN: Baruch Gouty, RN, BSN, Velva Harman Primary Care Physician: [name] Other Clinician: Referring Physician: Ricke [name] Treating Physician/Extender: [name] in Treatment: 3 Electronic Signature(s) Signed: 07/22/2016 4:12:23 PM By: Regan Lemming BSN, RN Entered By: Regan Lemming on 07/22/2016 15:30:01 [name] ([id number]) -------------------------------------------------------------------------------- Multi Wound Chart Details Patient Name: [name] Date of Service: 07/22/2016 3:30 PM Medical Record Number: [id number] Patient Account Number: [id number] Date of Birth/Sex: 14-Mar-1956 (60 y.o. Female) Treating RN: Montey Hora Primary Care Physician: [name] Other Clinician: Referring Physician: Ricke [name] Treating Physician/Extender: [name] in Treatment: 3 Vital Signs Height(in): 65 Pulse(bpm): 79 Weight(lbs): 159.2 Blood Pressure 120/52 (mmHg): Body Mass  Index(BMI): 26 Temperature(F): 98.1 Respiratory Rate 20 (breaths/min): Photos: [N/A:N/A] Wound Location: Right Upper Arm - Right Back - Proximal N/A Circumfernential Wounding Event: Gradually Appeared Gradually Appeared N/A Primary Etiology: Lymphedema To be determined N/A Comorbid History: Deep Vein Thrombosis, Deep Vein Thrombosis, N/A Hypertension, Received Hypertension, Received Chemotherapy, Received Chemotherapy, Received Radiation Radiation Date Acquired: 01/01/2016 03/30/2016 N/A Weeks of Treatment: 3 3 N/A Wound Status: Open Open N/A Measurements L x W x D 12x5x0.1 12x13x0.2 N/A (cm) Area (cm) : 47.124 122.522 N/A Volume (cm) :  4.712 24.504 N/A % Reduction in Area: 82.40% -23.80% N/A % Reduction in Volume: 82.40% -23.80% N/A Classification: Partial Thickness Partial Thickness N/A Exudate Amount: Large Large N/A Exudate Type: Serosanguineous Serosanguineous N/A Exudate Color: red, brown red, brown N/A Wound Margin: Distinct, outline attached Distinct, outline attached N/A Granulation Amount: Large (67-100%) None Present (0%) N/A [place], Jaliyah ([id number]) Granulation Quality: Red N/A N/A Necrotic Amount: Small (1-33%) Large (67-100%) N/A Exposed Structures: Fascia: No Fascia: No N/A Fat: No Fat: No Tendon: No Tendon: No Muscle: No Muscle: No Joint: No Joint: No Bone: No Bone: No Limited to Skin Limited to Skin Breakdown Breakdown Epithelialization: None None N/A Periwound Skin Texture: Edema: No Edema: No N/A Excoriation: No Excoriation: No Induration: No Induration: No Callus: No Callus: No Crepitus: No Crepitus: No Fluctuance: No Fluctuance: No Friable: No Friable: No Rash: No Rash: No Scarring: No Scarring: No Periwound Skin Maceration: No Moist: Yes N/A Moisture: Moist: No Maceration: No Dry/Scaly: No Dry/Scaly: No Periwound Skin Color: Atrophie Blanche: No Atrophie Blanche: No N/A Cyanosis: No Cyanosis: No Ecchymosis:  No Ecchymosis: No Erythema: No Erythema: No Hemosiderin Staining: No Hemosiderin Staining: No Mottled: No Mottled: No Pallor: No Pallor: No Rubor: No Rubor: No Temperature: No Abnormality No Abnormality N/A Tenderness on Yes Yes N/A Palpation: Wound Preparation: Ulcer Cleansing: Ulcer Cleansing: N/A Rinsed/Irrigated with Rinsed/Irrigated with Saline Saline Topical Anesthetic Topical Anesthetic Applied: Other: lidocaine Applied: Other: lidocaine 4% 4% Treatment Notes Electronic Signature(s) Signed: 07/22/2016 5:02:11 PM By: Montey Hora Entered By: Montey Hora on 07/22/2016 15:53:32 [name] ([id number]) -------------------------------------------------------------------------------- [place] Details Patient Name: [name] Date of Service: 07/22/2016 3:30 PM Medical Record Number: [id number] Patient Account Number: [id number] Date of Birth/Sex: [birth year] (60 y.o. Female) Treating RN: Montey Hora Primary Care Physician: [name] Other Clinician: Referring Physician: Ricke [name] Treating Physician/Extender: [name] in Treatment: 3 Active Inactive Malignancy/Atypical Etiology Nursing Diagnoses: Knowledge deficit related to disease process and management of malignancy Goals: Patient/caregiver will verbalize understanding of disease process and disease management of malignancy Date Initiated: 07/22/2016 Goal Status: Active Interventions: Provide education on malignant ulcerations Notes: Wound/Skin Impairment Nursing Diagnoses: Impaired tissue integrity Goals: Patient/caregiver will verbalize understanding of skin care regimen Date Initiated: 07/22/2016 Goal Status: Active Ulcer/skin breakdown will have a volume reduction of 30% by week 4 Date Initiated: 07/22/2016 Goal Status: Active Ulcer/skin breakdown will have a volume reduction of 50% by week 8 Date Initiated: 07/22/2016 Goal Status:  Active Ulcer/skin breakdown will have a volume reduction of 80% by week 12 Date Initiated: 07/22/2016 Goal Status: Active Ulcer/skin breakdown will heal within 14 weeks Date Initiated: 07/22/2016 Goal Status: Active ANDRENA, HOLDERNESS ([id number]) Interventions: Assess patient/caregiver ability to obtain necessary supplies Assess patient/caregiver ability to perform ulcer/skin care regimen upon admission and as needed Assess ulceration(s) every visit Notes: Electronic Signature(s) Signed: 07/22/2016 5:02:11 PM By: Montey Hora Entered By: Montey Hora on 07/22/2016 15:53:18 [name] ([id number]) -------------------------------------------------------------------------------- Pain Assessment Details Patient Name: [name] Date of Service: 07/22/2016 3:30 PM Medical Record Number: [id number] Patient Account Number: [id number] Date of Birth/Sex: 1955-11-20 (60 y.o. Female) Treating RN: Baruch Gouty, RN, BSN, Velva Harman Primary Care Physician: [name] Other Clinician: Referring Physician: Ricke [name] Treating Physician/Extender: [name] in Treatment: 3 Active Problems Location of Pain Severity and Description of Pain Patient Has Paino No Site Locations With Dressing Change: No Pain Management and Medication Current Pain Management: Electronic Signature(s) Signed: 07/22/2016 4:12:23 PM By: Regan Lemming BSN, RN Entered By: Regan Lemming on 07/22/2016 15:27:42 [place], [place] (  [id number]) -------------------------------------------------------------------------------- Patient/Caregiver Education Details Patient Name: [name] Date of Service: 07/22/2016 3:30 PM Medical Record Number: [id number] Patient Account Number: [id number] Date of Birth/Gender: February 23, 1956 (60 y.o. Female) Treating RN: Baruch Gouty, RN, BSN, Velva Harman Primary Care Physician: [name] Other Clinician: Referring Physician: Ricke [name] Treating Physician/Extender: [name] in  Treatment: 3 Education Assessment Education Provided To: Patient Education Topics Provided Malignant/Atypical Wounds: Methods: Explain/Verbal Responses: State content correctly Wound/Skin Impairment: Methods: Explain/Verbal Responses: State content correctly Electronic Signature(s) Signed: 07/22/2016 4:12:23 PM By: Regan Lemming BSN, RN Entered By: Regan Lemming on 07/22/2016 16:11:28 [name] ([id number]) -------------------------------------------------------------------------------- Wound Assessment Details Patient Name: [name] Date of Service: 07/22/2016 3:30 PM Medical Record Number: [id number] Patient Account Number: [id number] Date of Birth/Sex: 11-Jun-1956 (60 y.o. Female) Treating RN: Afful, RN, BSN, [place] Primary Care Physician: [name] Other Clinician: Referring Physician: Ricke [name] Treating Physician/Extender: [name] in Treatment: 3 Wound Status Wound Number: 2 Primary Lymphedema Etiology: Wound Location: Right Upper Arm - Circumfernential Wound Open Status: Wounding Event: Gradually Appeared Comorbid Deep Vein Thrombosis, Hypertension, Date Acquired: 01/01/2016 History: Received Chemotherapy, Received Weeks Of Treatment: 3 Radiation Clustered Wound: No Photos Photo Uploaded By: Regan Lemming on 07/22/2016 15:43:46 Wound Measurements Length: (cm) 12 Width: (cm) 5 Depth: (cm) 0.1 Area: (cm) 47.124 Volume: (cm) 4.712 % Reduction in Area: 82.4% % Reduction in Volume: 82.4% Epithelialization: None Tunneling: No Undermining: No Wound Description Classification: Partial Thickness Wound Margin: Distinct, outline attached Exudate Amount: Large Exudate Type: Serosanguineous Exudate Color: red, brown Foul Odor After Cleansing: No Wound Bed Granulation Amount: Large (67-100%) Exposed Structure Granulation Quality: Red Fascia Exposed: No Necrotic Amount: Small (1-33%) Fat Layer Exposed: No Hayner, Shaunta  ([id number]) Necrotic Quality: Adherent Slough Tendon Exposed: No Muscle Exposed: No Joint Exposed: No Bone Exposed: No Limited to Skin Breakdown Periwound Skin Texture Texture Color No Abnormalities Noted: No No Abnormalities Noted: No Callus: No Atrophie Blanche: No Crepitus: No Cyanosis: No Excoriation: No Ecchymosis: No Fluctuance: No Erythema: No Friable: No Hemosiderin Staining: No Induration: No Mottled: No Localized Edema: No Pallor: No Rash: No Rubor: No Scarring: No Temperature / Pain Moisture Temperature: No Abnormality No Abnormalities Noted: No Tenderness on Palpation: Yes Dry / Scaly: No Maceration: No Moist: No Wound Preparation Ulcer Cleansing: Rinsed/Irrigated with Saline Topical Anesthetic Applied: Other: lidocaine 4%, Treatment Notes Wound #2 (Right, Circumferential Upper Arm) 4. Dressing Applied: Aquacel Ag 5. Secondary Dressing Applied Bordered Foam Dressing Dry Gauze Electronic Signature(s) Signed: 07/22/2016 4:12:23 PM By: Regan Lemming BSN, RN Entered By: Regan Lemming on 07/22/2016 15:36:31 [name] ([id number]) -------------------------------------------------------------------------------- Wound Assessment Details Patient Name: [name] Date of Service: 07/22/2016 3:30 PM Medical Record Number: [id number] Patient Account Number: [id number] Date of Birth/Sex: 1955-12-22 (60 y.o. Female) Treating RN: Afful, RN, BSN, Velva Harman Primary Care Physician: [name] Other Clinician: Referring Physician: Ricke [name] Treating Physician/Extender: [name] in Treatment: 3 Wound Status Wound Number: 3 Primary To be determined Etiology: Wound Location: Right Back - Proximal Wound Open Wounding Event: Gradually Appeared Status: Date Acquired: 03/30/2016 Comorbid Deep Vein Thrombosis, Hypertension, Weeks Of Treatment: 3 History: Received Chemotherapy, Received Clustered Wound: No Radiation Photos Photo Uploaded  By: Regan Lemming on 07/22/2016 15:43:47 Wound Measurements Length: (cm) 12 Width: (cm) 13 Depth: (cm) 0.2 Area: (cm) 122.522 Volume: (cm) 24.504 % Reduction in Area: -23.8% % Reduction in Volume: -23.8% Epithelialization: None Tunneling: No Undermining: No Wound Description Classification: Partial Thickness Wound Margin: Distinct, outline attached Exudate Amount: Large Exudate Type: Serosanguineous Exudate Color: red, brown  Foul Odor After Cleansing: No Wound Bed Granulation Amount: None Present (0%) Exposed Structure Necrotic Amount: Large (67-100%) Fascia Exposed: No Necrotic Quality: Adherent Slough Fat Layer Exposed: No Tendon Exposed: No [place], Meiah ([id number]) Muscle Exposed: No Joint Exposed: No Bone Exposed: No Limited to Skin Breakdown Periwound Skin Texture Texture Color No Abnormalities Noted: No No Abnormalities Noted: No Callus: No Atrophie Blanche: No Crepitus: No Cyanosis: No Excoriation: No Ecchymosis: No Fluctuance: No Erythema: No Friable: No Hemosiderin Staining: No Induration: No Mottled: No Localized Edema: No Pallor: No Rash: No Rubor: No Scarring: No Temperature / Pain Moisture Temperature: No Abnormality No Abnormalities Noted: No Tenderness on Palpation: Yes Dry / Scaly: No Maceration: No Moist: Yes Wound Preparation Ulcer Cleansing: Rinsed/Irrigated with Saline Topical Anesthetic Applied: Other: lidocaine 4%, Treatment Notes Wound #3 (Right, Proximal Back) 4. Dressing Applied: Aquacel Ag 5. Secondary Dressing Applied Bordered Foam Dressing Dry Gauze Electronic Signature(s) Signed: 07/22/2016 4:12:23 PM By: Regan Lemming BSN, RN Entered By: Regan Lemming on 07/22/2016 15:36:59 [name] ([id number]) -------------------------------------------------------------------------------- Vitals Details Patient Name: [name] Date of Service: 07/22/2016 3:30 PM Medical Record Number: [id number] Patient Account Number:  [id number] Date of Birth/Sex: [birth year] (60 y.o. Female) Treating RN: Afful, RN, BSN, [place] Primary Care Physician: [name] Other Clinician: Referring Physician: Ricke [name] Treating Physician/Extender: [name] in Treatment: 3 Vital Signs Time Taken: 15:27 Temperature (F): 98.1 Height (in): 65 Pulse (bpm): 79 Weight (lbs): 159.2 Respiratory Rate (breaths/min): 20 Body Mass Index (BMI): 26.5 Blood Pressure (mmHg): 120/52 Reference Range: 80 - 120 mg / dl Electronic Signature(s) Signed: 07/22/2016 4:12:23 PM By: Regan Lemming BSN, RN Entered By: Regan Lemming on 07/22/2016 15:29:51

## 2016-07-30 ENCOUNTER — Emergency Department (HOSPITAL_COMMUNITY)
Admission: EM | Admit: 2016-07-30 | Discharge: 2016-07-31 | Disposition: A | Discharge status: Home / Self Care | Payer: Commercial Managed Care - PPO | Attending: Emergency Medicine | Admitting: Emergency Medicine

## 2016-07-30 ENCOUNTER — Other Ambulatory Visit: Payer: Self-pay

## 2016-07-30 ENCOUNTER — Encounter (HOSPITAL_COMMUNITY): Payer: Self-pay

## 2016-07-30 ENCOUNTER — Emergency Department (HOSPITAL_COMMUNITY): Payer: Commercial Managed Care - PPO

## 2016-07-30 DIAGNOSIS — Z79899 Other long term (current) drug therapy: Secondary | ICD-10-CM | POA: Insufficient documentation

## 2016-07-30 DIAGNOSIS — T480X1A Poisoning by oxytocic drugs, accidental (unintentional), initial encounter: Secondary | ICD-10-CM | POA: Insufficient documentation

## 2016-07-30 DIAGNOSIS — F17200 Nicotine dependence, unspecified, uncomplicated: Secondary | ICD-10-CM | POA: Insufficient documentation

## 2016-07-30 DIAGNOSIS — I10 Essential (primary) hypertension: Secondary | ICD-10-CM | POA: Diagnosis not present

## 2016-07-30 DIAGNOSIS — Z853 Personal history of malignant neoplasm of breast: Secondary | ICD-10-CM | POA: Diagnosis not present

## 2016-07-30 DIAGNOSIS — T50901A Poisoning by unspecified drugs, medicaments and biological substances, accidental (unintentional), initial encounter: Secondary | ICD-10-CM

## 2016-07-30 LAB — ETHANOL: Alcohol, Ethyl (B): <5 mg/dL (ref <5)

## 2016-07-30 LAB — CBC WITH DIFFERENTIAL/PLATELET
Basophils Absolute: 0 10*3/uL (ref 0.0–0.1)
Basophils Relative: 1 %
Eosinophils Absolute: 0.2 10*3/uL (ref 0.0–0.7)
Eosinophils Relative: 4 %
HCT: 36.1 % (ref 36.0–46.0)
Hemoglobin: 10.8 g/dL — ABNORMAL LOW (ref 12.0–15.0)
Lymphocytes Relative: 31 %
Lymphs Abs: 1.6 10*3/uL (ref 0.7–4.0)
MCH: 25.7 pg — ABNORMAL LOW (ref 26.0–34.0)
MCHC: 29.9 g/dL — ABNORMAL LOW (ref 30.0–36.0)
MCV: 85.7 fL (ref 78.0–100.0)
Monocytes Absolute: 0.5 10*3/uL (ref 0.1–1.0)
Monocytes Relative: 9 %
Neutro Abs: 2.8 10*3/uL (ref 1.7–7.7)
Neutrophils Relative %: 55 %
Platelets: 189 10*3/uL (ref 150–400)
RBC: 4.21 MIL/uL (ref 3.87–5.11)
RDW: 17.5 % — ABNORMAL HIGH (ref 11.5–15.5)
WBC: 5.1 10*3/uL (ref 4.0–10.5)

## 2016-07-30 LAB — ACETAMINOPHEN LEVEL: Acetaminophen (Tylenol), Serum: <10 ug/mL — ABNORMAL LOW (ref 10–30)

## 2016-07-30 LAB — SALICYLATE LEVEL: Salicylate Lvl: <4 mg/dL (ref 2.8–30.0)

## 2016-07-30 MED ORDER — SODIUM CHLORIDE 0.9 % IV BOLUS (SEPSIS)
1000.0000 mL | Freq: Once | INTRAVENOUS | Status: AC
  Administered 2016-07-30: 1000 mL via INTRAVENOUS

## 2016-07-30 NOTE — ED Notes (Signed)
Contact Information  Brown Human (Daughter) [phone]

## 2016-07-30 NOTE — ED Provider Notes (Signed)
[facility] DEPT Provider Note   CSN: [id number] Arrival date & time: 07/30/16  2040     History   Chief Complaint Chief Complaint  Patient presents with  . Drug Overdose    HPI [name] is a 60 y.o. female.  HPI  60 year old female who presents with accidental overdose. History of stage IV breast cancer s/p CRT, last CTX one month. History provided by EMS who states that family called EMS as patient was unresponsive. Given 2 mg narcan IM and 1 mg intranasal narcan with appropriate response after 3 minutes. States that she took her oxycodone, OxyContin, Xanax and Zoloft today at the same time, but is unsure of what time. States that she has not had new medication changes. Denies intentional overdose. Denies dyspnea, chest pain, abd pain, nausea, vomiting, fever, chills, or falls.  Past Medical History:  Diagnosis Date  . Breast cancer ([place])   . DVT (deep venous thrombosis) ([place]) 10/07/2011  . Hypertension   . Radiation 11/29/2005-12/29/2005   right supraclavicular area 4147 cGy  . Radiation 06/26/02-08/14/02   right breast 5040 cGy, tumor bed boosted to 1260 cGy  . Rheumatic fever     Patient Active Problem List   Diagnosis Date Noted  . Port catheter in place 05/30/2016  . 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 Chronic pain 04/13/2015  . Left arm pain 08/18/2014  . Breast cancer of upper-inner quadrant of right female breast ([place]) 10/03/2013  . Post-lymphadenectomy lymphedema of arm 08/15/2013  . Port or reservoir infection 01/29/2013  . DVT (deep venous thrombosis) ([place]) 10/07/2011  . Chest pain 09/10/2009  . Secondary cardiomyopathy ([place]) 09/02/2009  . Essential hypertension 02/26/2009  . GERD 02/26/2009    Past Surgical History:  Procedure Laterality Date  . BREAST SURGERY    . MASTECTOMY     bilateral  . PORT A CATH REVISION      OB History    No data available       Home Medications    Prior to Admission medications    Medication Sig Start Date End Date Taking? Authorizing Provider  alprazolam Duanne Moron) 2 MG tablet Take 2 mg by mouth daily.  03/15/13   Historical Provider, MD  Ascorbic Acid (VITAMIN C) 100 MG tablet Take 100 mg by mouth daily.      Historical Provider, MD  calcium-vitamin D (OSCAL WITH D) 500-200 MG-UNIT per tablet Take 1 tablet by mouth daily.      Historical Provider, MD  dextromethorphan-guaiFENesin (MUCINEX DM) 30-600 MG 12hr tablet Take 1 tablet by mouth 2 (two) times daily. 11/06/15   Chauncey Cruel, MD  diphenoxylate-atropine (LOMOTIL) 2.5-0.025 MG tablet Take 1 tablet by mouth 4 (four) times daily as needed for diarrhea or loose stools. 06/30/16   Chauncey Cruel, MD  gabapentin (NEURONTIN) 300 MG capsule Take 1 capsule (300 mg total) by mouth at bedtime. 03/20/15   Chauncey Cruel, MD  HYDROcodone-homatropine [facility]) 5-1.5 MG/5ML syrup Take 5 mLs by mouth every 6 (six) hours as needed for cough. 06/07/16   Bill Salinas, NP  Multiple Vitamin (MULTIVITAMIN) capsule Take 1 capsule by mouth daily.      Historical Provider, MD  ondansetron (ZOFRAN) 8 MG tablet TAKE ONE TABLET BY MOUTH TWICE DAILY AS NEEDED FOR NAUSEA AND VOMITING 06/20/16   Chauncey Cruel, MD  oxyCODONE (OXYCONTIN) 20 mg 12 hr tablet Take 1 tablet (20 mg total) by mouth every 12 (twelve) hours. 07/19/16  Chauncey Cruel, MD  oxyCODONE (ROXICODONE) 15 MG immediate release tablet Take 2 tablets (30 mg total) by mouth every 3 (three) hours as needed for pain. 07/19/16   Chauncey Cruel, MD  predniSONE (DELTASONE) 5 MG tablet Take 2 tablets (10 mg total) by mouth daily with breakfast. 06/15/16   Chauncey Cruel, MD  prochlorperazine (COMPAZINE) 10 MG tablet Take 1 tablet (10 mg total) by mouth every 6 (six) hours as needed (Nausea or vomiting). 05/30/16   Chauncey Cruel, MD  valACYclovir (VALTREX) 500 MG tablet Take 1 tablet (500 mg total) by mouth 2 (two) times daily. 07/19/16   Chauncey Cruel, MD    Family  History Family History  Problem Relation Age of Onset  . Pancreatic cancer Mother   . Kidney cancer Sister 70  . Breast cancer Sister 22  . Breast cancer Other 75    Social History Social History  Substance Use Topics  . Smoking status: Light Tobacco Smoker  . Smokeless tobacco: Never Used  . Alcohol use Yes     Comment: Rarely     Allergies   Tramadol and Hydrocodone-acetaminophen   Review of Systems Review of Systems 10/14 systems reviewed and are negative other than those stated in the HPI   Physical Exam Updated Vital Signs BP 114/66   Pulse (!) 55   Temp 98.5 F (36.9 C) (Oral)   Resp 12   Ht 5\' 6"  (1.676 m)   Wt 160 lb (72.6 kg)   SpO2 93%   BMI 25.82 kg/m   Physical Exam Physical Exam  Nursing note and vitals reviewed. Constitutional: Well developed, well nourished, non-toxic, and in no acute distress Head: Normocephalic and atraumatic.  Mouth/Throat: Oropharynx is clear and moist.  Neck: Normal range of motion. Neck supple.  Cardiovascular: Normal rate and regular rhythm.   Pulmonary/Chest: Effort normal and breath sounds normal.  Abdominal: Soft. There is no tenderness. There is no rebound and no guarding.  Musculoskeletal: No deformities Neurological: Sleepy but easily awoken by voice, answers questions appropriately, no facial droop, fluent speech, moves all extremities symmetrically Skin: Skin is warm and dry.  Psychiatric: Cooperative   ED Treatments / Results  Labs (all labs ordered are listed, but only abnormal results are displayed) Labs Reviewed  ACETAMINOPHEN LEVEL - Abnormal; Notable for the following:       Result Value   Acetaminophen (Tylenol), Serum <10 (*)    All other components within normal limits  CBC WITH DIFFERENTIAL/PLATELET - Abnormal; Notable for the following:    Hemoglobin 10.8 (*)    MCH 25.7 (*)    MCHC 29.9 (*)    RDW 17.5 (*)    All other components within normal limits  SALICYLATE LEVEL  ETHANOL  URINE  RAPID DRUG SCREEN, HOSP PERFORMED  URINALYSIS, ROUTINE W REFLEX MICROSCOPIC (NOT AT [facility])    EKG  EKG Interpretation None       Radiology Dg Chest Portable 1 View  Result Date: 07/30/2016 CLINICAL DATA:  Altered mental status today, family in room thinks may have taken to many medications. EXAM: PORTABLE CHEST 1 VIEW COMPARISON:  12/08/2014 FINDINGS: Power port type central venous catheter with tip over the cavoatrial junction region. No pneumothorax. Surgical clips in the right axilla. Shallow inspiration. Heart size and pulmonary vascularity are normal. No focal airspace disease or consolidation in the lungs. No blunting of costophrenic angles. No pneumothorax. IMPRESSION: No active disease. Electronically Signed   By: Oren Beckmann.D.  On: 07/30/2016 22:26    Procedures Procedures (including critical care time)  Medications Ordered in ED Medications  sodium chloride 0.9 % bolus 1,000 mL (0 mLs Intravenous Stopped 07/30/16 2255)     Initial Impression / Assessment and Plan / ED Course  I have reviewed the triage vital signs and the nursing notes.  Pertinent labs & imaging results that were available during my care of the patient were reviewed by me and considered in my medical decision making (see chart for details).  Clinical Course    60 year old female With history of stage IV breast cancer who presents with accidental overdose. This is likely due to polypharmacy given that she has been taking this today as a pain with narcotics and other psych together at the same time. She is sleepy on my evaluation, but arouses to voice, answering questions appropriately. Afebrile, with initially soft BPs but normalized after IVF. No hypoxia or apnea. Basic blood work overall unremarkable. No evidence of tylenol or salicylate toxicity. Will observe in ED. At this time not requiring repeat does of narcan. If continues to respond appropriately with normal vital signs and not requiring  repeat narcan, can be discharged home.  Final Clinical Impressions(s) / ED Diagnoses   Final diagnoses:  Accidental overdose, initial encounter    New Prescriptions New Prescriptions   No medications on file     Forde Dandy, MD 07/31/16 [phone]

## 2016-07-30 NOTE — ED Triage Notes (Signed)
Pt from home. Pt found unresponsive by family and first responders. Pt has history of [place] cancer and has no change in medications. Pt was given 2mg  narcan and 1mg  intranasal with response after 58mins.

## 2016-07-31 NOTE — Discharge Instructions (Signed)
Please be careful when taking your narcotic medications, xanax and psych medications. Avoid taking them at the same time. Only take as prescribed.  Return for worsening symptoms, including difficulty breathing, confusion, intractable vomiting or any other symptoms concerning to you.

## 2016-07-31 NOTE — ED Provider Notes (Signed)
2:27 AM patient is awake and alert and shows no signs of respiratory depression. Is stable for discharge. Advised of return precautions   Sherwood Gambler, MD 07/31/16 [phone]

## 2016-08-03 ENCOUNTER — Other Ambulatory Visit: Payer: Self-pay | Admitting: *Deleted

## 2016-08-03 MED ORDER — OXYCODONE HCL 15 MG PO TABS: 30.0000 mg | ORAL_TABLET | ORAL | 0 refills | Status: DC | PRN

## 2016-08-03 NOTE — Telephone Encounter (Signed)
See other nurse entry note.  This RN spoke with pt per prescription pick up regarding current use of oxy IR due to note as " early fill " - prescription states pt may take up to 2 tabs q 3 hours - dispense amount for 100 tabs last dispensed 9/12 is not necessarily early depending on use.  [name] is using increased Oxy IR due to she cannot locate her bottle of oxycontin.  [name] states she " cannot find the oxycontin bottle " - " when I brought it home I hid it from myself and now can't remember where I put it "  This RN reiterated with [name] need for a lock box so she can put her medications in upon filling for safe keeping. Informed her once medications are dispensed it is her responsibility for safety concerns- refills cannot be given for " lost medications " per [employer].  [name] will go home and look for [name]  This note will be sent to MD for communication.

## 2016-08-03 NOTE — Telephone Encounter (Signed)
TC from patient requesting refill of her oxycodone 15 mg tablets.  Last filled on 07/19/16 for 100 tablets. This another 'early refill' request.

## 2016-08-03 NOTE — Telephone Encounter (Signed)
This RN spoke with pt per her call stating need for refill on pain medication oxyIR.  Reviewed and noted pt may take up to 12 tabs a day if needed - [name] is having more pain in back due to her air conditioning is " broke " and she is staying warm.  This RN discussed with pt need to use fans and keep cool for better pain control of areas of open wounds.  Refill obtained for pick up.

## 2016-08-03 NOTE — Telephone Encounter (Signed)
"  I need the nurse.  I'm Hot and in pain.  I need my pain medication.  The air conditioning went out in my home.  Call transferred ext [phone].

## 2016-08-09 ENCOUNTER — Encounter: Payer: Self-pay | Admitting: *Deleted

## 2016-08-09 ENCOUNTER — Other Ambulatory Visit: Payer: Commercial Managed Care - PPO

## 2016-08-09 ENCOUNTER — Other Ambulatory Visit: Payer: Self-pay | Admitting: Oncology

## 2016-08-09 ENCOUNTER — Ambulatory Visit: Payer: Commercial Managed Care - PPO

## 2016-08-09 ENCOUNTER — Telehealth: Payer: Self-pay | Admitting: Oncology

## 2016-08-09 NOTE — Progress Notes (Signed)
[facility]  [phone] [phone]  OFFICE PROGRESS NOTE   ID: [name]   DOB: 02/07/1956  MR#: [id number]  CSN#:[id number]   PCP: MCKENZIE, WAYLAND, MD SU: Peter R. Young, [name]/Faera Byerly, [name] RAD ONC:Matthew Manning, [name] OTHER:  Daniel [name], MD, Errol Britto MD  CHIEF COMPLAINT:  Stage IV Breast Cancer, angiosarcoma  CURRENT TREATMENT: Abraxane, trastuzumab, pertuzumab  BREAST CANCER HISTORY: From the original intake note:  The patient originally presented in May 2003 when she noticed a lump in the upper inner quadrant of her right breast in September 2003.  She sought attention and had a mammogram which showed an obvious carcinoma in the upper inner quadrant of the right breast, approximately 2 cm.  There were some enlarged lymph nodes in the axilla and an FNA done showed those consistent with malignant cells, most likely an invasive ductal carcinoma.  At that point she was unsure of what to do and was referred by Dr. Peter Young for a discussion of her treatment options.  By biopsy, it was ER/PR negative and HER-2 was 3+.  DNA index was 1.42.  We reiterated that it was most important to have her disease surgically addressed at that point and had recommended lumpectomy and axillary nodes to be addressed with which she followed through and on 04-03-02, Dr. Young performed a right partial mastectomy and a right axillary lymph node dissection.  Final pathology revealed a 2.4 cm. high grade, Grade III invasive ductal carcinoma with an adjacent .8 cm. also high grade invasive ductal carcinoma which was felt to represent an intramammary metastasis rather than a second primary.  A smaller mass was just medial to the larger mass.  The smaller mass was associated with high grade DCIS component.  There was no definite lymphovascular invasion identified.  However, one of fourteen axillary lymph nodes did contain a 1.5 cm. metastatic  deposit.  Postoperatively she did very well.  We reiterated the fact that she did need adjuvant chemotherapy, however, she refused and decided that she would pursue radiation.  She received radiation and completed that on 08-14-02. Her subsequent history is as detailed below.   INTERVAL HISTORY: [name] returns today with her caregiver for follow-up of [name]'s angiosarcoma of the right upper extremity and breast cancer. [name] is being treated with Abraxane, and trastuzumab/pertuzumab. Most of the symptoms that she complains about related to her treatment are due to the pertuzumab and she is unwilling to receive that any further. In addition though she was scheduled for treatment today she tells me she has to go to [place] to help her children because her ex-husband is now disabled and incapacitated. Her treatment today accordingly is being postponed  REVIEW OF SYSTEMS: Reygan had some mouth sores. She did not let us know about that before. She was seen at the wound clinic and she says they initially thought she might have MRSA but now they think possibly what she has on her upper right back is related to radiation. She does have significant back pain. She says the fentanyl patch "falls off" and doesn't work for her. She is using mostly oxycodone 15 mg tablets. She appears to have run out of the OxyContin 20 mg tablets. She denies diarrhea or constipation. She describes itself was forgetful, anxious, depressed, and weak. A detailed review of systems today was otherwise stable  PAST MEDICAL HISTORY: Past Medical History:  Diagnosis Date  . Breast cancer (HCC)   . DVT (deep venous thrombosis) (  [place]) 10/07/2011  . Hypertension   . Radiation 11/29/2005-12/29/2005   right supraclavicular area 4147 cGy  . Radiation 06/26/02-08/14/02   right breast 5040 cGy, tumor bed boosted to 1260 cGy  . Rheumatic fever     PAST SURGICAL HISTORY: Past Surgical History:  Procedure Laterality Date  .  BREAST SURGERY    . MASTECTOMY     bilateral  . PORT A CATH REVISION      FAMILY HISTORY Family History  Problem Relation Age of Onset  . Pancreatic cancer Mother   . Kidney cancer Sister 82  . Breast cancer Sister 34  . Breast cancer Other 22  The patient's father died in his 09W  from complications of alcohol abuse. The patient's mother died in her 40s from pancreatic cancer. The patient has 6 brothers, 2 sisters. Both her sisters have had breast cancer, both diagnosed after the age of 61. The patient has not had genetic testing so far.   GYNECOLOGIC HISTORY: Menarche age 27; first live birth age 33. She is GXP4. She is not sure when she stopped having periods. She never used hormone replacement.  SOCIAL HISTORY: The patient has worked in the past as a Psychologist, counselling. At home in addition to the patient are her husband [name], originally from [place], who works as a Administrator.  Also living in the home is her son [name] and her daughter [name] (she is studying to be a Marine scientist).  The other 2 children are [name], who has a college degree in psychology and works as a Probation officer; and [name], who lives in [place] and works as a Higher education careers adviser.   ADVANCED DIRECTIVES: Not in place  HEALTH MAINTENANCE: Social History  Substance Use Topics  . Smoking status: Light Tobacco Smoker  . Smokeless tobacco: Never Used  . Alcohol use Yes     Comment: Rarely     Colonoscopy:  Not on file  PAP:  Not on file  Bone density:  Not on file  Lipid panel:  Not on file    Allergies  Allergen Reactions  . Tramadol Nausea Only  . Hydrocodone-Acetaminophen Itching and Rash    Current Outpatient Prescriptions  Medication Sig Dispense Refill  . alprazolam (XANAX) 2 MG tablet Take 2 mg by mouth daily.     . Ascorbic Acid (VITAMIN C) 100 MG tablet Take 100 mg by mouth daily.      . calcium-vitamin D (OSCAL WITH D) 500-200 MG-UNIT per tablet Take 1 tablet by mouth daily.      Marland Kitchen  dextromethorphan-guaiFENesin (MUCINEX DM) 30-600 MG 12hr tablet Take 1 tablet by mouth 2 (two) times daily. 14 tablet 0  . diphenoxylate-atropine (LOMOTIL) 2.5-0.025 MG tablet Take 1 tablet by mouth 4 (four) times daily as needed for diarrhea or loose stools. 60 tablet 1  . gabapentin (NEURONTIN) 300 MG capsule Take 1 capsule (300 mg total) by mouth at bedtime. 90 capsule 4  . HYDROcodone-homatropine (HYCODAN) 5-1.5 MG/5ML syrup Take 5 mLs by mouth every 6 (six) hours as needed for cough. 120 mL 0  . Multiple Vitamin (MULTIVITAMIN) capsule Take 1 capsule by mouth daily.      . ondansetron (ZOFRAN) 8 MG tablet TAKE ONE TABLET BY MOUTH TWICE DAILY AS NEEDED FOR NAUSEA AND VOMITING 30 tablet 1  . oxyCODONE (OXYCONTIN) 20 mg 12 hr tablet Take 1 tablet (20 mg total) by mouth every 12 (twelve) hours. 40 tablet 0  . oxyCODONE (ROXICODONE) 15 MG immediate release tablet Take 2 tablets (30  mg total) by mouth every 3 (three) hours as needed for pain. 100 tablet 0  . predniSONE (DELTASONE) 5 MG tablet Take 2 tablets (10 mg total) by mouth daily with breakfast. 60 tablet 6  . prochlorperazine (COMPAZINE) 10 MG tablet Take 1 tablet (10 mg total) by mouth every 6 (six) hours as needed (Nausea or vomiting). 30 tablet 1  . valACYclovir (VALTREX) 500 MG tablet Take 1 tablet (500 mg total) by mouth 2 (two) times daily. 30 tablet 12   No current facility-administered medications for this visit.    Facility-Administered Medications Ordered in Other Visits  Medication Dose Route Frequency Provider Last Rate Last Dose  . sodium chloride 0.9 % injection 10 mL  10 mL Intravenous PRN Chauncey Cruel, MD   10 mL at 03/04/14 1421    OBJECTIVE: Middle-aged [place] American woman   There were no vitals filed for this visit.   There is no height or weight on file to calculate BMI.    ECOG FS: 2 There were no vitals filed for this visit.  Sclerae unicteric, EOMs intact Oropharynx clear and moist No cervical or  supraclavicular adenopathy Lungs no rales or rhonchi Heart regular rate and rhythm Abd soft, nontender, positive bowel sounds MSK no focal spinal tenderness Neuro: nonfocal, well oriented, appropriate affect Breasts: Deferred Skin: Images of a right upper extremity in her upper back are pasted below  Right upper extremity 07/19/2016    Upper right back 07/19/2016.    LAB RESULTS:.  Lab Results  Component Value Date   WBC 5.1 07/30/2016   NEUTROABS 2.8 07/30/2016   HGB 10.8 (L) 07/30/2016   HCT 36.1 07/30/2016   MCV 85.7 07/30/2016   PLT 189 07/30/2016      Chemistry      Component Value Date/Time   NA 140 07/19/2016 1013   K 4.0 07/19/2016 1013   CL 100 08/14/2014 0100   CL 101 03/07/2013 1411   CO2 30 (H) 07/19/2016 1013   BUN 5.9 (L) 07/19/2016 1013   CREATININE 0.8 07/19/2016 1013      Component Value Date/Time   CALCIUM 9.6 07/19/2016 1013   ALKPHOS 82 07/19/2016 1013   AST 20 07/19/2016 1013   ALT 13 07/19/2016 1013   BILITOT <0.30 07/19/2016 1013       STUDIES: Dg Chest Portable 1 View  Result Date: 07/30/2016 CLINICAL DATA:  Altered mental status today, family in room thinks may have taken to many medications. EXAM: PORTABLE CHEST 1 VIEW COMPARISON:  12/08/2014 FINDINGS: Power port type central venous catheter with tip over the cavoatrial junction region. No pneumothorax. Surgical clips in the right axilla. Shallow inspiration. Heart size and pulmonary vascularity are normal. No focal airspace disease or consolidation in the lungs. No blunting of costophrenic angles. No pneumothorax. IMPRESSION: No active disease. Electronically Signed   By: Lucienne Capers [name]   On: 07/30/2016 22:26     ASSESSMENT:  60 y.o. [place] woman with stage IV breast cancer manifested chiefly by loco-regional nodal disease (neck, chest), without lung, liver, bone, or brain involvement.  (1) Status post right upper inner quadrant lumpectomy and sentinel lymph node  sampling 03/04/2002 for 2 separate foci of invasive ductal carcinoma, mpT2 pN1 or stage IIB, both foci grade 3, both estrogen and progesterone receptor positive, both HercepTest 3+, Mib-1 56%  (2) Reexcision for margins 05/27/2002 showed no residual cancer in the breast.  (3) The patient refused adjuvant systemic therapy.  (4) Adjuvant radiation treatment completed 08/14/2002.  (  5) recurrence in the right breast in 02/2004 showing a morphologically different tumor, again grade 3, again estrogen and progesterone receptor negative, with an MIB-1 of 14% and Herceptest 3+.  (5) Between 03/2004 and 07/2004 she received dose dense Doxorubicin/Cyclophosphamide x 4 given with trastuzumab, followed by weekly Docetaxel x 8, again given with trastuzumab.  (6) Right mastectomy 07/13/2004 showed scattered microscopic foci of residual disease over an area  greater than 5 cm. Margins were negative.  (7) Postoperative Docetaxel continued until 09/2004.  (8) Trastuzumab (Herceptin) given 08/2004 through 01/2012 with some brief interruptions.  (9) Isolated right cervical nodal recurrence 10/2005, treated with radiation to the right supraclavicular area (total 41.5 gray) completed 12/29/2005.  (10) Navelbine given together with Herceptin  11/2005 through 03/2006.  (9) Left mastectomy 02/13/2006 for ductal carcinoma in situ, grade 2, estrogen and progesterone receptor negative, with negative margins; 0 of 3 lymph nodes involved  (10) treated with Lapatinib and Capecitabine before 10/2009, for an unclear duration and with unclear results (cannot locate data on chart review).  (11) Status post right supraclavicular lymph node biopsy 09/2010 again positive for an invasive ductal carcinoma, estrogen and progesterone receptor negative, HER-2 positive by C[facility] with a ratio 4.25.  (12) Navelbine given together with Herceptin between 05/2011 and 11/2011.  (13) Carboplatin/ Gemcitabine/ Herceptin given for 2 cycles,  in 12/2011 and 01/2012.  (14) TDM-1 (Kadcyla) started 02/2012. Last dose 10/02/2013 after which the patient discontinued treatment at her own discretion. Echo on December 2014 showed a well preserved ejection fraction.  (15) Deep vein thrombosis of the right upper extremity documented 04/20/2011.  She completed anticoagulant therapy with Coumadin on 03/25/2013.  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 Left chest Port-A-Cath placed on 04/08/2013; being flushed every 6 weeks  RIGHT UPPER EXTREMITY ANGIOSARCOMA VS BREAST CANCER: (18) [facility] with paclitaxel, trastuzumab and pertuzumab 1 cycle, afterwards referred to hospice  (19) under the care of hospice of  August 2016 05/08/2016, when the patient opted for a second try at chemotherapy  (20) started low-dose Abraxane, trastuzumab and pertuzumab 06/07/2016, to be repeated every 3 weeks.   (a) pretreatment echocardiogram 06/06/2016 showed a 60-65% ejection fraction  (b) pertuzumab discontinued beginning September 2017 because of poor tolerance   PLAN: Although Betheny's wounds look so terrible, and in fact they are, nevertheless there has been a clear improvement with her current treatment. I urged her to go ahead and get treated today since I don't think that would compromise her ability to help her family in [place]. Nevertheless she very much did not want to be treated today but then agreed to return in 2 weeks or 3 depending on how long it takes her over there and we will proceed with treatment at that time.  At the next visit also we will do our best to clarify her healthcare part of attorney situation.  Her pain control seems to be adequate  although it has been somewhat chaotic. Apparently some family members took some of her medication or at least the medication could not be found. We asked her to call the police to report this and she did so. We refilled her medicine but now she appears to have run out of the 20 mg OxyContin tablets.  I   refilled both the 20 mg OxyContin and the 15 mg oxycodone tablets so she would have enough she goes to [place]. She is doing well as far as the bowel prophylactic regimen is concerned.  She knows to call for any problem that may develop before her next visit here.    Chauncey Cruel, MD  08/09/2016 2:26 PM

## 2016-08-09 NOTE — Telephone Encounter (Signed)
lvm to inform pt of r/s appt to 10/6 per LOS

## 2016-08-12 ENCOUNTER — Other Ambulatory Visit: Payer: Commercial Managed Care - PPO

## 2016-08-12 ENCOUNTER — Ambulatory Visit: Payer: Commercial Managed Care - PPO

## 2016-08-17 ENCOUNTER — Telehealth: Payer: Self-pay | Admitting: *Deleted

## 2016-08-17 ENCOUNTER — Other Ambulatory Visit: Payer: Self-pay

## 2016-08-17 DIAGNOSIS — C50211 Malignant neoplasm of upper-inner quadrant of right female breast: Secondary | ICD-10-CM

## 2016-08-17 MED ORDER — OXYCODONE HCL 15 MG PO TABS: 30.0000 mg | ORAL_TABLET | ORAL | 0 refills | Status: DC | PRN

## 2016-08-17 NOTE — Progress Notes (Signed)
Request rcvd from [facility] Case Management for treatment plan update and progress note.  Printed and faxed to Mardene Celeste [phone] (phone [phone]).  Sent to scan.

## 2016-08-17 NOTE — Telephone Encounter (Signed)
Patient called @ 245 to check to see if her pain meds. Was ready to be picked up, after calling injection I was made aware it was not yet ready. I made her aware of that, Pt got really upset and said  (WHY ISNT IT READY.... I"M TRIED OF YOU"LL LYING TO ME) She asked to speak with Nurse that she had spoke with earlier, during transferred patient hung up.

## 2016-08-17 NOTE — Telephone Encounter (Signed)
Patient called needing refill of Oxycodone 15 mg. Please call patient once it is ready for pick up.

## 2016-08-19 ENCOUNTER — Encounter: Payer: Commercial Managed Care - PPO | Attending: Nurse Practitioner | Admitting: Nurse Practitioner

## 2016-08-19 DIAGNOSIS — L98491 Non-pressure chronic ulcer of skin of other sites limited to breakdown of skin: Secondary | ICD-10-CM | POA: Diagnosis not present

## 2016-08-19 DIAGNOSIS — F17210 Nicotine dependence, cigarettes, uncomplicated: Secondary | ICD-10-CM | POA: Diagnosis not present

## 2016-08-19 DIAGNOSIS — I972 Postmastectomy lymphedema syndrome: Secondary | ICD-10-CM | POA: Diagnosis not present

## 2016-08-19 DIAGNOSIS — X58XXXA Exposure to other specified factors, initial encounter: Secondary | ICD-10-CM | POA: Diagnosis not present

## 2016-08-19 DIAGNOSIS — S41101A Unspecified open wound of right upper arm, initial encounter: Secondary | ICD-10-CM | POA: Diagnosis not present

## 2016-08-19 DIAGNOSIS — L98421 Non-pressure chronic ulcer of back limited to breakdown of skin: Secondary | ICD-10-CM | POA: Diagnosis not present

## 2016-08-19 DIAGNOSIS — S2190XA Unspecified open wound of unspecified part of thorax, initial encounter: Secondary | ICD-10-CM | POA: Diagnosis not present

## 2016-08-19 DIAGNOSIS — I890 Lymphedema, not elsewhere classified: Secondary | ICD-10-CM | POA: Diagnosis not present

## 2016-08-19 DIAGNOSIS — C4911 Malignant neoplasm of connective and soft tissue of right upper limb, including shoulder: Secondary | ICD-10-CM | POA: Diagnosis present

## 2016-08-19 DIAGNOSIS — Z853 Personal history of malignant neoplasm of breast: Secondary | ICD-10-CM | POA: Diagnosis not present

## 2016-08-20 NOTE — Progress Notes (Signed)
LURIE, CETRONE ([id number]) Visit Report for 08/19/2016 Arrival Information Details Patient Name: [name], [name] Date of Service: 08/19/2016 12:45 PM Medical Record Number: [id number] Patient Account Number: [id number] Date of Birth/Sex: November 09, 1955 (60 y.o. Female) Treating RN: Afful, RN, BSN, Velva Harman Primary Care Physician: [name] Other Clinician: Referring Physician: [name] Treating Physician/Extender: Loistine Chance in Treatment: 7 Visit Information History Since Last Visit All ordered tests and consults were completed: No Patient Arrived: Ambulatory Added or deleted any medications: No Arrival Time: 12:53 Any new allergies or adverse reactions: No Accompanied By: hubby Had a fall or experienced change in No Transfer Assistance: None activities of daily living that may affect Patient Identification Verified: Yes risk of falls: Secondary Verification Process Yes Signs or symptoms of abuse/neglect since last No Completed: visito Patient Requires Transmission- No Hospitalized since last visit: No Based Precautions: Has Dressing in Place as Prescribed: Yes Patient Has Alerts: Yes Pain Present Now: No Patient Alerts: NO BP in RIGHT ARM Electronic Signature(s) Signed: 08/19/2016 4:35:15 PM By: Regan Lemming BSN, RN Entered By: Regan Lemming on 08/19/2016 12:54:46 [name] ([id number]) -------------------------------------------------------------------------------- Clinic Level of Care Assessment Details Patient Name: [name] Date of Service: 08/19/2016 12:45 PM Medical Record Number: [id number] Patient Account Number: [id number] Date of Birth/Sex: 1956-08-15 (60 y.o. Female) Treating RN: Afful, RN, BSN, [place] Primary Care Physician: [name] Other Clinician: Referring Physician: [name] Treating Physician/Extender: Loistine Chance in Treatment: 7 Clinic Level of Care Assessment Items TOOL 4 Quantity Score []  - Use  when only an EandM is performed on FOLLOW-UP visit 0 ASSESSMENTS - Nursing Assessment / Reassessment X - Reassessment of Co-morbidities (includes updates in patient status) 1 10 X - Reassessment of Adherence to Treatment Plan 1 5 ASSESSMENTS - Wound and Skin Assessment / Reassessment X - Simple Wound Assessment / Reassessment - one wound 1 5 []  - Complex Wound Assessment / Reassessment - multiple wounds 0 []  - Dermatologic / Skin Assessment (not related to wound area) 0 ASSESSMENTS - Focused Assessment []  - Circumferential Edema Measurements - multi extremities 0 []  - Nutritional Assessment / Counseling / Intervention 0 []  - Lower Extremity Assessment (monofilament, tuning fork, pulses) 0 []  - Peripheral Arterial Disease Assessment (using hand held doppler) 0 ASSESSMENTS - Ostomy and/or Continence Assessment and Care []  - Incontinence Assessment and Management 0 []  - Ostomy Care Assessment and Management (repouching, etc.) 0 PROCESS - Coordination of Care X - Simple Patient / Family Education for ongoing care 1 15 []  - Complex (extensive) Patient / Family Education for ongoing care 0 []  - Staff obtains Programmer, systems, Records, Test Results / Process Orders 0 []  - Staff telephones HHA, Nursing Homes / Clarify orders / etc 0 []  - Routine Transfer to another Facility (non-emergent condition) 0 [name], [name] ([id number]) []  - Routine Hospital Admission (non-emergent condition) 0 []  - New Admissions / Biomedical engineer / Ordering NPWT, Apligraf, etc. 0 []  - Emergency Hospital Admission (emergent condition) 0 []  - Simple Discharge Coordination 0 []  - Complex (extensive) Discharge Coordination 0 PROCESS - Special Needs []  - Pediatric / Minor Patient Management 0 []  - Isolation Patient Management 0 []  - Hearing / Language / Visual special needs 0 []  - Assessment of Community assistance (transportation, D/C planning, etc.) 0 []  - Additional assistance / Altered mentation 0 []  - Support  Surface(s) Assessment (bed, cushion, seat, etc.) 0 INTERVENTIONS - Wound Cleansing / Measurement X - Simple Wound Cleansing - one wound 1 5 []  - Complex Wound Cleansing - multiple wounds  0 X - Wound Imaging (photographs - any number of wounds) 1 5 []  - Wound Tracing (instead of photographs) 0 X - Simple Wound Measurement - one wound 1 5 []  - Complex Wound Measurement - multiple wounds 0 INTERVENTIONS - Wound Dressings X - Small Wound Dressing one or multiple wounds 1 10 []  - Medium Wound Dressing one or multiple wounds 0 []  - Large Wound Dressing one or multiple wounds 0 []  - Application of Medications - topical 0 []  - Application of Medications - injection 0 INTERVENTIONS - Miscellaneous []  - External ear exam 0 [name], [name] ([id number]) []  - Specimen Collection (cultures, biopsies, blood, body fluids, etc.) 0 []  - Specimen(s) / Culture(s) sent or taken to Lab for analysis 0 []  - Patient Transfer (multiple staff / Harrel Lemon Lift / Similar devices) 0 []  - Simple Staple / Suture removal (25 or less) 0 []  - Complex Staple / Suture removal (26 or more) 0 []  - Hypo / Hyperglycemic Management (close monitor of Blood Glucose) 0 []  - Ankle / Brachial Index (ABI) - do not check if billed separately 0 X - Vital Signs 1 5 Has the patient been seen at the hospital within the last three years: Yes Total Score: 65 Level Of Care: New/Established - Level 2 Electronic Signature(s) Signed: 08/19/2016 4:35:15 PM By: Regan Lemming BSN, RN Entered By: Regan Lemming on 08/19/2016 13:22:46 [name] ([id number]) -------------------------------------------------------------------------------- Encounter Discharge Information Details Patient Name: [name] Date of Service: 08/19/2016 12:45 PM Medical Record Number: [id number] Patient Account Number: [id number] Date of Birth/Sex: Jun 27, 1956 (60 y.o. Female) Treating RN: Baruch Gouty, RN, BSN, Velva Harman Primary Care Physician: [name] Other  Clinician: Referring Physician: [name] Treating Physician/Extender: Loistine Chance in Treatment: 7 Encounter Discharge Information Items Discharge Pain Level: 0 Discharge Condition: Stable Ambulatory Status: Ambulatory Discharge Destination: Home Transportation: Private Auto Accompanied By: Micheline Rough Schedule Follow-up Appointment: No Medication Reconciliation completed and provided to Patient/Care No Mani Celestin: Provided on Clinical Summary of Care: 08/19/2016 Form Type Recipient Paper Patient MF Electronic Signature(s) Signed: 08/19/2016 1:29:03 PM By: Ruthine Dose Entered By: Ruthine Dose on 08/19/2016 13:29:03 [name] ([id number]) -------------------------------------------------------------------------------- Lower Extremity Assessment Details Patient Name: [name] Date of Service: 08/19/2016 12:45 PM Medical Record Number: [id number] Patient Account Number: [id number] Date of Birth/Sex: 02/01/1956 (60 y.o. Female) Treating RN: Afful, RN, BSN, Velva Harman Primary Care Physician: [name] Other Clinician: Referring Physician: [name] Treating Physician/Extender: Loistine Chance in Treatment: 7 Electronic Signature(s) Signed: 08/19/2016 4:35:15 PM By: Regan Lemming BSN, RN Entered By: Regan Lemming on 08/19/2016 12:55:22 [name] ([id number]) -------------------------------------------------------------------------------- Multi Wound Chart Details Patient Name: [name] Date of Service: 08/19/2016 12:45 PM Medical Record Number: [id number] Patient Account Number: [id number] Date of Birth/Sex: 1955-11-21 (60 y.o. Female) Treating RN: Baruch Gouty, RN, BSN, Velva Harman Primary Care Physician: [name] Other Clinician: Referring Physician: [name] Treating Physician/Extender: Loistine Chance in Treatment: 7 Vital Signs Height(in): 65 Pulse(bpm): 67 Weight(lbs): 159.2 Blood Pressure 116/67 (mmHg): Body  Mass Index(BMI): 26 Temperature(F): 98.8 Respiratory Rate 19 (breaths/min): Photos: [2:No Photos] [3:No Photos] [N/A:N/A] Wound Location: [2:Right Upper Arm - Circumfernential] [3:Right Back - Proximal] [N/A:N/A] Wounding Event: [2:Gradually Appeared] [3:Gradually Appeared] [N/A:N/A] Primary Etiology: [2:Lymphedema] [3:Lymphedema] [N/A:N/A] Comorbid History: [2:Deep Vein Thrombosis, Hypertension, Received Chemotherapy, Received Radiation] [3:Deep Vein Thrombosis, Hypertension, Received Chemotherapy, Received Radiation] [N/A:N/A] Date Acquired: [2:01/01/2016] [3:03/30/2016] [N/A:N/A] Weeks of Treatment: [2:7] [3:7] [N/A:N/A] Wound Status: [2:Open] [3:Open] [N/A:N/A] Measurements L x W x D 0x0x0 [3:2x1.5x0.1] [N/A:N/A] (cm) Area (cm) : [2:0] [3:2.356] [N/A:N/A] Volume (  cm) : [2:0] [3:0.236] [N/A:N/A] % Reduction in Area: [2:100.00%] [3:97.60%] [N/A:N/A] % Reduction in Volume: 100.00% [3:98.80%] [N/A:N/A] Classification: [2:Partial Thickness] [3:Partial Thickness] [N/A:N/A] Exudate Amount: [2:Large] [3:Medium] [N/A:N/A] Exudate Type: [2:Serosanguineous] [3:Serosanguineous] [N/A:N/A] Exudate Color: [2:red, brown] [3:red, brown] [N/A:N/A] Wound Margin: [2:Distinct, outline attached] [3:Distinct, outline attached] [N/A:N/A] Granulation Amount: [2:None Present (0%)] [3:Medium (34-66%)] [N/A:N/A] Granulation Quality: [2:N/A] [3:Pink, Pale] [N/A:N/A] Necrotic Amount: [2:None Present (0%)] [3:Small (1-33%)] [N/A:N/A] Exposed Structures: [2:Fascia: No Fat: No Tendon: No Muscle: No] [3:Fascia: No Fat: No Tendon: No Muscle: No] [N/A:N/A] Joint: No Joint: No Bone: No Bone: No Limited to Skin Limited to Skin Breakdown Breakdown Epithelialization: Large (67-100%) Large (67-100%) N/A Periwound Skin Texture: Edema: No Edema: No N/A Excoriation: No Excoriation: No Induration: No Induration: No Callus: No Callus: No Crepitus: No Crepitus: No Fluctuance: No Fluctuance: No Friable:  No Friable: No Rash: No Rash: No Scarring: No Scarring: No Periwound Skin Dry/Scaly: Yes Moist: Yes N/A Moisture: Maceration: No Maceration: No Moist: No Dry/Scaly: No Periwound Skin Color: Atrophie Blanche: No Atrophie Blanche: No N/A Cyanosis: No Cyanosis: No Ecchymosis: No Ecchymosis: No Erythema: No Erythema: No Hemosiderin Staining: No Hemosiderin Staining: No Mottled: No Mottled: No Pallor: No Pallor: No Rubor: No Rubor: No Temperature: No Abnormality No Abnormality N/A Tenderness on No Yes N/A Palpation: Wound Preparation: Ulcer Cleansing: Ulcer Cleansing: N/A Rinsed/Irrigated with Rinsed/Irrigated with Saline Saline Topical Anesthetic Topical Anesthetic Applied: None Applied: Other: lidocaine 4% Treatment Notes Electronic Signature(s) Signed: 08/19/2016 4:35:15 PM By: Regan Lemming BSN, RN Entered By: Regan Lemming on 08/19/2016 13:16:56 [name] ([id number]) -------------------------------------------------------------------------------- Multi-Disciplinary Care Plan Details Patient Name: [name] Date of Service: 08/19/2016 12:45 PM Medical Record Number: [id number] Patient Account Number: [id number] Date of Birth/Sex: September 23, 1956 (60 y.o. Female) Treating RN: Afful, RN, BSN, Velva Harman Primary Care Physician: [name] Other Clinician: Referring Physician: [name] Treating Physician/Extender: Loistine Chance in Treatment: 7 Active Inactive Malignancy/Atypical Etiology Nursing Diagnoses: Knowledge deficit related to disease process and management of malignancy Goals: Patient/caregiver will verbalize understanding of disease process and disease management of malignancy Date Initiated: 07/22/2016 Goal Status: Active Interventions: Provide education on malignant ulcerations Notes: Wound/Skin Impairment Nursing Diagnoses: Impaired tissue integrity Goals: Patient/caregiver will verbalize understanding of skin care  regimen Date Initiated: 07/22/2016 Goal Status: Active Ulcer/skin breakdown will have a volume reduction of 30% by week 4 Date Initiated: 07/22/2016 Goal Status: Active Ulcer/skin breakdown will have a volume reduction of 50% by week 8 Date Initiated: 07/22/2016 Goal Status: Active Ulcer/skin breakdown will have a volume reduction of 80% by week 12 Date Initiated: 07/22/2016 Goal Status: Active Ulcer/skin breakdown will heal within 14 weeks Date Initiated: 07/22/2016 Goal Status: Active [name], [name] ([id number]) Interventions: Assess patient/caregiver ability to obtain necessary supplies Assess patient/caregiver ability to perform ulcer/skin care regimen upon admission and as needed Assess ulceration(s) every visit Notes: Electronic Signature(s) Signed: 08/19/2016 4:35:15 PM By: Regan Lemming BSN, RN Entered By: Regan Lemming on 08/19/2016 13:15:35 [name] ([id number]) -------------------------------------------------------------------------------- Pain Assessment Details Patient Name: [name] Date of Service: 08/19/2016 12:45 PM Medical Record Number: [id number] Patient Account Number: [id number] Date of Birth/Sex: 1956-01-29 (60 y.o. Female) Treating RN: Baruch Gouty, RN, BSN, Velva Harman Primary Care Physician: [name] Other Clinician: Referring Physician: [name] Treating Physician/Extender: Loistine Chance in Treatment: 7 Active Problems Location of Pain Severity and Description of Pain Patient Has Paino No Site Locations With Dressing Change: No Pain Management and Medication Current Pain Management: Electronic Signature(s) Signed: 08/19/2016 4:35:15 PM By: Regan Lemming BSN, RN Entered By:  Regan Lemming on 08/19/2016 12:54:59 [name], [name] ([id number]) -------------------------------------------------------------------------------- Patient/Caregiver Education Details Patient Name: [name] Date of Service: 08/19/2016 12:45 PM Medical Record  Number: [id number] Patient Account Number: [id number] Date of Birth/Gender: 04/05/1956 (60 y.o. Female) Treating RN: Baruch Gouty, RN, BSN, Velva Harman Primary Care Physician: [name] Other Clinician: Referring Physician: [name] Treating Physician/Extender: Loistine Chance in Treatment: 7 Education Assessment Education Provided To: Patient Education Topics Provided Malignant/Atypical Wounds: Methods: Explain/Verbal Responses: State content correctly Wound/Skin Impairment: Methods: Explain/Verbal Responses: State content correctly Electronic Signature(s) Signed: 08/19/2016 4:35:15 PM By: Regan Lemming BSN, RN Entered By: Regan Lemming on 08/19/2016 13:23:58 [name] ([id number]) -------------------------------------------------------------------------------- Wound Assessment Details Patient Name: [name] Date of Service: 08/19/2016 12:45 PM Medical Record Number: [id number] Patient Account Number: [id number] Date of Birth/Sex: 1956/02/13 (60 y.o. Female) Treating RN: Afful, RN, BSN, Velva Harman Primary Care Physician: [name] Other Clinician: Referring Physician: [name] Treating Physician/Extender: Loistine Chance in Treatment: 7 Wound Status Wound Number: 2 Primary Lymphedema Etiology: Wound Location: Right Upper Arm - Circumfernential Wound Open Status: Wounding Event: Gradually Appeared Comorbid Deep Vein Thrombosis, Hypertension, Date Acquired: 01/01/2016 History: Received Chemotherapy, Received Weeks Of Treatment: 7 Radiation Clustered Wound: No Photos Photo Uploaded By: Regan Lemming on 08/19/2016 15:35:28 Wound Measurements Length: (cm) 0 % Reductio Width: (cm) 0 % Reductio Depth: (cm) 0 Epithelial Area: (cm) 0 Tunneling Volume: (cm) 0 Undermini n in Area: 100% n in Volume: 100% ization: Large (67-100%) : No ng: No Wound Description Classification: Partial Thickness Wound Margin: Distinct, outline  attached Exudate Amount: Large Exudate Type: Serosanguineous Exudate Color: red, brown Foul Odor After Cleansing: No Wound Bed Granulation Amount: None Present (0%) Exposed Structure Necrotic Amount: None Present (0%) Fascia Exposed: No Fat Layer Exposed: No Tendon Exposed: No Muscle Exposed: No Joint Exposed: No [name], [name] ([id number]) Bone Exposed: No Limited to Skin Breakdown Periwound Skin Texture Texture Color No Abnormalities Noted: No No Abnormalities Noted: No Callus: No Atrophie Blanche: No Crepitus: No Cyanosis: No Excoriation: No Ecchymosis: No Fluctuance: No Erythema: No Friable: No Hemosiderin Staining: No Induration: No Mottled: No Localized Edema: No Pallor: No Rash: No Rubor: No Scarring: No Temperature / Pain Moisture Temperature: No Abnormality No Abnormalities Noted: No Dry / Scaly: Yes Maceration: No Moist: No Wound Preparation Ulcer Cleansing: Rinsed/Irrigated with Saline Topical Anesthetic Applied: None Electronic Signature(s) Signed: 08/19/2016 4:35:15 PM By: Regan Lemming BSN, RN Entered By: Regan Lemming on 08/19/2016 13:03:12 [name] ([id number]) -------------------------------------------------------------------------------- Wound Assessment Details Patient Name: [name] Date of Service: 08/19/2016 12:45 PM Medical Record Number: [id number] Patient Account Number: [id number] Date of Birth/Sex: Dec 23, 1955 (60 y.o. Female) Treating RN: Baruch Gouty, RN, BSN, Velva Harman Primary Care Physician: [name] Other Clinician: Referring Physician: [name] Treating Physician/Extender: Loistine Chance in Treatment: 7 Wound Status Wound Number: 3 Primary Lymphedema Etiology: Wound Location: Right Back - Proximal Wound Open Wounding Event: Gradually Appeared Status: Date Acquired: 03/30/2016 Comorbid Deep Vein Thrombosis, Hypertension, Weeks Of Treatment: 7 History: Received Chemotherapy, Received Clustered Wound:  No Radiation Photos Photo Uploaded By: Regan Lemming on 08/19/2016 15:35:28 Wound Measurements Length: (cm) 2 Width: (cm) 1.5 Depth: (cm) 0.1 Area: (cm) 2.356 Volume: (cm) 0.236 % Reduction in Area: 97.6% % Reduction in Volume: 98.8% Epithelialization: Large (67-100%) Tunneling: No Undermining: No Wound Description Classification: Partial Thickness Wound Margin: Distinct, outline attached Exudate Amount: Medium Exudate Type: Serosanguineous Exudate Color: red, brown Foul Odor After Cleansing: No Wound Bed Granulation Amount: Medium (34-66%) Exposed Structure Granulation Quality: Pink, Pale Fascia Exposed: No Necrotic  Amount: Small (1-33%) Fat Layer Exposed: No Necrotic Quality: Adherent Slough Tendon Exposed: No Muscle Exposed: No Joint Exposed: No [name], [name] ([id number]) Bone Exposed: No Limited to Skin Breakdown Periwound Skin Texture Texture Color No Abnormalities Noted: No No Abnormalities Noted: No Callus: No Atrophie Blanche: No Crepitus: No Cyanosis: No Excoriation: No Ecchymosis: No Fluctuance: No Erythema: No Friable: No Hemosiderin Staining: No Induration: No Mottled: No Localized Edema: No Pallor: No Rash: No Rubor: No Scarring: No Temperature / Pain Moisture Temperature: No Abnormality No Abnormalities Noted: No Tenderness on Palpation: Yes Dry / Scaly: No Maceration: No Moist: Yes Wound Preparation Ulcer Cleansing: Rinsed/Irrigated with Saline Topical Anesthetic Applied: Other: lidocaine 4%, Treatment Notes Wound #3 (Right, Proximal Back) 1. Cleansed with: Cleanse wound with antibacterial soap and water 4. Dressing Applied: Aquacel Ag 5. Secondary Dressing Applied Bordered Foam Dressing Electronic Signature(s) Signed: 08/19/2016 4:35:15 PM By: Regan Lemming BSN, RN Entered By: Regan Lemming on 08/19/2016 13:02:53 [name] ([id number]) -------------------------------------------------------------------------------- Vitals  Details Patient Name: [name] Date of Service: 08/19/2016 12:45 PM Medical Record Number: [id number] Patient Account Number: [id number] Date of Birth/Sex: 1956-03-10 (60 y.o. Female) Treating RN: Afful, RN, BSN, [place] Primary Care Physician: [name] Other Clinician: Referring Physician: [name] Treating Physician/Extender: Loistine Chance in Treatment: 7 Vital Signs Time Taken: 12:55 Temperature (F): 98.8 Height (in): 65 Pulse (bpm): 67 Weight (lbs): 159.2 Respiratory Rate (breaths/min): 19 Body Mass Index (BMI): 26.5 Blood Pressure (mmHg): 116/67 Reference Range: 80 - 120 mg / dl Electronic Signature(s) Signed: 08/19/2016 4:35:15 PM By: Regan Lemming BSN, RN Entered By: Regan Lemming on 08/19/2016 12:55:49

## 2016-08-20 NOTE — Progress Notes (Signed)
[name] ([id number]) Visit Report for 08/19/2016 Chief Complaint Document Details Patient Name: [name], [name] 08/19/2016 12:45 Date of Service: PM Medical Record [id number] Number: Patient Account Number: [id number] 03-09-1956 (60 y.o. Treating RN: [name] Gouty, RN, BSN, [name] Date of Birth/Sex: Female) Other Clinician: Primary Care Physician: [name] Treating [name] Referring Physician: Ricke [name] Physician/Extender: [name] in Treatment: 7 Information Obtained from: Patient Chief Complaint Patient presents to the wound care center for a consult due non healing wound. The patient was known to have bilateral lymphedema as a status post mastectomy syndrome now comes with an ulcerated area of her right upper arm and back which she's had for several weeks. Electronic Signature(s) Signed: 08/19/2016 5:31:30 PM By: [name] Moh FNP Entered By: [name] on 08/19/2016 13:51:51 [name] ([id number]) -------------------------------------------------------------------------------- HPI Details Patient Name: [name] 08/19/2016 12:45 Date of Service: PM Medical Record [id number] Number: Patient Account Number: [id number] [birth year] (60 y.o. Treating RN: [name] Gouty, RN, BSN, [name] Date of Birth/Sex: Female) Other Clinician: Primary Care Physician: [name] Treating [name] Referring Physician: Ricke [name] Physician/Extender: [name] in Treatment: 7 History of Present Illness Location: bilateral arm lymphedema with now skin ulceration of the right upper extremity. Quality: Patient reports experiencing a dull pain to affected area(s). Severity: Patient states wound are getting worse. Duration: Patient has had the wound for > 18 months prior to seeking treatment at the wound center Timing: Pain in wound is Intermittent (comes and goes Context: The wound occurred when the patiento. Modifying Factors: Consults to this date include:  local care and antibiotics as prescribed by the PCP and the ER. Associated Signs and Symptoms: Patient reports having increase discharge. HPI Description: 60 year old patient known to [name] from a prior visit about a year ago, returns for care. she is known to have stage IV breast cancer, angiosarcoma and has been on treatment with the medical oncologist [name]. She has been seeing the hospice nurses earlier and has been continuing with chemotherapy. She was known to have stage IV breast cancer, originally diagnosed in April 2003 and now manifested chiefly by loco-regional nodal disease (neck, chest), without lung, liver, bone, or brain involvement. he has received extensive radiation and chemotherapy after the surgeries. She's always had bilateral lymphedema of her arms but now comes with an ulcerated area on her right upper arm posteriorly. Her medical Oncologist, [name], was concerned that what we may be seeing in Barry's right upper extremity is actually skin spread of her cancer. This needs to be biopsied and she was sending her back to general surgery with that in mind. She placed a referral to the wound clinic . She has been on doxycycline for now. The general surgeon saw her 2 days ago and did a biopsy and this report is pending. In April 2016 she had a right upper arm venous duplex study which showed: No evidence of deep vein or superficial thrombosis involving the ooright upper extremity and left subclavian vein. Moderate edema noted throughout the right upper extremity. 05/01/2015 the patient tells me that her surgeon revealed that her biopsy was negative and that there was no pathology found. Other than that she is doing well and she thinks her edema has gone down a bit. I had done a biopsy for her about a year ago and final report is not back yet but the pathologist [name] did speak to me on the phone a few days ago and mentioned that he is doing final stains  and there may be a  mixture of a angiosarcoma and metastatic breast cancer. I have not been able to speak to [name] yet but I understand the patient has seen him today and he has discussed the provisional pathology and offered her some chemotherapy. The final pathology report is back and it shows a metastatic breast carcinoma and an atypical vascular proliferation. 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 She informs me that she is going to [facility] to the [place] for further care. [name], [name] ([id number]) 08/19/16: pt returns today for f/u. she attended the cancer center in [place]. The right arm wound has healed and significant reduction in wound size regarding he right back. She denies systemic s/s of infection. Electronic Signature(s) Signed: 08/19/2016 5:31:30 PM By: [name] Moh FNP Entered By: [name] on 08/19/2016 13:53:09 [name] ([id number]) -------------------------------------------------------------------------------- Physical Exam Details Patient Name: [name] 08/19/2016 12:45 Date of Service: PM Medical Record [id number] Number: Patient Account Number: [id number] [birth year] (60 y.o. Treating RN: [name] Gouty, RN, BSN, [name] Date of Birth/Sex: Female) Other Clinician: Primary Care Physician: [name] Treating [name] Referring Physician: Ricke [name] Physician/Extender: [name] in Treatment: 7 Constitutional chronically ill appearing female in NAD.Marland Kitchen Eyes Conjunctivae clear. No discharge.Marland Kitchen Respiratory Respiratory effort is easy and symmetric bilaterally. Rate is normal at rest and on room air.Marland Kitchen Psychiatric Judgement and insight intact.. Alert and oriented times 3.. Short and long term memory intact.. No evidence of depression, anxiety, or agitation. Calm, cooperative, and communicative. Appropriate interactions and affect.. Notes right upper arm edema remains. the wounds have healed. the right  upper back wound with 100% healthy red granulation tissue noted. significant improvement from prior visit. 3 + non pitting lymphedema of the right arm. Electronic Signature(s) Signed: 08/19/2016 5:31:30 PM By: [name] Moh FNP Entered By: [name] on 08/19/2016 13:54:41 [name] ([id number]) -------------------------------------------------------------------------------- Physician Orders Details Patient Name: SKYLAAR, GEISSINGER 08/19/2016 12:45 Date of Service: PM Medical Record [id number] Number: Patient Account Number: [id number] [birth year] (60 y.o. Treating RN: [name] Gouty, RN, BSN, [name] Date of Birth/Sex: Female) Other Clinician: Primary Care Physician: [name] Treating [name] Referring Physician: Ricke [name] Physician/Extender: [name] in Treatment: 7 Verbal / Phone Orders: Yes Clinician: Afful, RN, BSN, Rita Read Back and Verified: Yes Diagnosis Coding Wound Cleansing Wound #3 Right,Proximal Back o Cleanse wound with mild soap and water o May Shower, gently pat wound dry prior to applying new dressing. Primary Wound Dressing Wound #3 Right,Proximal Back o Aquacel Ag Secondary Dressing Wound #3 Right,Proximal Back o Boardered Foam Dressing Dressing Change Frequency Wound #3 Right,Proximal Back o Change dressing every day. Follow-up Appointments Wound #3 Right,Proximal Back o Return Appointment in: - 3 weeks Additional Orders / Instructions Wound #3 Right,Proximal Back o Increase protein intake. o Activity as tolerated Electronic Signature(s) Signed: 08/19/2016 5:31:30 PM By: [name] Moh FNP Entered By: [name] on 08/19/2016 13:56:19 [name] ([id number]) -------------------------------------------------------------------------------- Problem List Details Patient Name: BRYNNLIE, ELLZEY 08/19/2016 12:45 Date of Service: PM Medical Record [id number] Number: Patient Account Number:  [id number] 1956-05-21 (60 y.o. Treating RN: [name] Gouty, RN, BSN, [name] Date of Birth/Sex: Female) Other Clinician: Primary Care Physician: [name] Treating [name] Referring Physician: Ricke [name] Physician/Extender: [name] in Treatment: 7 Active Problems ICD-10 Encounter Code Description Active Date Diagnosis Z85.3 Personal history of malignant neoplasm of breast 06/30/2016 Yes C49.11 Malignant neoplasm of connective and soft tissue of right 06/30/2016 Yes upper limb, including shoulder S41.101A Unspecified open wound of right upper arm, initial 06/30/2016 Yes encounter S21.90XA Unspecified open wound of unspecified part of  thorax, 06/30/2016 Yes initial encounter I97.2 Postmastectomy lymphedema syndrome 06/30/2016 Yes Inactive Problems Resolved Problems Electronic Signature(s) Signed: 08/19/2016 5:31:30 PM By: [name] Moh FNP Entered By: [name] on 08/19/2016 13:51:44 [name] ([id number]) -------------------------------------------------------------------------------- Progress Note Details Patient Name: [name], [name] 08/19/2016 12:45 Date of Service: PM Medical Record [id number] Number: Patient Account Number: [id number] 05-17-1956 (60 y.o. Treating RN: [name] Gouty, RN, BSN, [name] Date of Birth/Sex: Female) Other Clinician: Primary Care Physician: [name] Treating [name] Referring Physician: Ricke [name] Physician/Extender: [name] in Treatment: 7 Subjective Chief Complaint Information obtained from Patient Patient presents to the wound care center for a consult due non healing wound. The patient was known to have bilateral lymphedema as a status post mastectomy syndrome now comes with an ulcerated area of her right upper arm and back which she's had for several weeks. History of Present Illness (HPI) The following HPI elements were documented for the patient's wound: Location: bilateral arm lymphedema with now skin  ulceration of the right upper extremity. Quality: Patient reports experiencing a dull pain to affected area(s). Severity: Patient states wound are getting worse. Duration: Patient has had the wound for > 18 months prior to seeking treatment at the wound center Timing: Pain in wound is Intermittent (comes and goes Context: The wound occurred when the patient . Modifying Factors: Consults to this date include: local care and antibiotics as prescribed by the PCP and the ER. Associated Signs and Symptoms: Patient reports having increase discharge. 60 year old patient known to [name] from a prior visit about a year ago, returns for care. she is known to have stage IV breast cancer, angiosarcoma and has been on treatment with the medical oncologist [name]. She has been seeing the hospice nurses earlier and has been continuing with chemotherapy. She was known to have stage IV breast cancer, originally diagnosed in April 2003 and now manifested chiefly by loco-regional nodal disease (neck, chest), without lung, liver, bone, or brain involvement. he has received extensive radiation and chemotherapy after the surgeries. She's always had bilateral lymphedema of her arms but now comes with an ulcerated area on her right upper arm posteriorly. Her medical Oncologist, [name], was concerned that what we may be seeing in [place]'s right upper extremity is actually skin spread of her cancer. This needs to be biopsied and she was sending her back to general surgery with that in mind. She placed a referral to the wound clinic . She has been on doxycycline for now. The general surgeon saw her 2 days ago and did a biopsy and this report is pending. In April 2016 she had a right upper arm venous duplex study which showed: No evidence of deep vein or superficial thrombosis involving the right upper extremity and left subclavian vein. Moderate edema noted throughout the right upper  extremity. 05/01/2015 the patient tells me that her surgeon revealed that her biopsy was negative and that there was no pathology found. Other than that she is doing well and she thinks her edema has gone down a bit. TARAJI, AHEDO ([id number]) I had done a biopsy for her about a year ago and final report is not back yet but the pathologist [name] did speak to me on the phone a few days ago and mentioned that he is doing final stains and there may be a mixture of a angiosarcoma and metastatic breast cancer. I have not been able to speak to [name] yet but I understand the patient has seen him today and he has  discussed the provisional pathology and offered her some chemotherapy. The final pathology report is back and it shows a metastatic breast carcinoma and an atypical vascular proliferation. 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 She informs me that she is going to [facility] to the [place] for further care. 08/19/16: pt returns today for f/u. she attended the cancer center in [place]. The right arm wound has healed and significant reduction in wound size regarding he right back. She denies systemic s/s of infection. Objective Constitutional chronically ill appearing female in NAD.Marland Kitchen Vitals Time Taken: 12:55 PM, Height: 65 in, Weight: 159.2 lbs, BMI: 26.5, Temperature: 98.8 F, Pulse: 67 bpm, Respiratory Rate: 19 breaths/min, Blood Pressure: 116/67 mmHg. Eyes Conjunctivae clear. No discharge.Marland Kitchen Respiratory Respiratory effort is easy and symmetric bilaterally. Rate is normal at rest and on room air.Marland Kitchen Psychiatric Judgement and insight intact.. Alert and oriented times 3.. Short and long term memory intact.. No evidence of depression, anxiety, or agitation. Calm, cooperative, and communicative. Appropriate interactions and affect.. General Notes: right upper arm edema remains. the wounds have healed. the right upper back wound with 100% healthy red  granulation tissue noted. significant improvement from prior visit. 3 + non pitting lymphedema of the right arm. Integumentary (Hair, Skin) Wound #2 status is Open. Original cause of wound was Gradually Appeared. The wound is located on the Right,Circumferential Upper Arm. The wound measures 0cm length x 0cm width x 0cm depth; 0cm^2 area and 0cm^3 volume. The wound is limited to skin breakdown. There is no tunneling or undermining noted. There is a large amount of serosanguineous drainage noted. The wound margin is distinct with the outline attached to the wound base. There is no granulation within the wound bed. There is no necrotic tissue within the wound bed. The periwound skin appearance exhibited: Dry/Scaly. The periwound skin appearance did not exhibit: Callus, Crepitus, Excoriation, Fluctuance, Friable, Induration, Localized Edema, Rash, Scarring, Maceration, Moist, Atrophie Blanche, Cyanosis, Ecchymosis, Hemosiderin Staining, Vieth, Koryn ([id number]) Mottled, Pallor, Rubor, Erythema. Periwound temperature was noted as No Abnormality. Wound #3 status is Open. Original cause of wound was Gradually Appeared. The wound is located on the Right,Proximal Back. The wound measures 2cm length x 1.5cm width x 0.1cm depth; 2.356cm^2 area and 0.236cm^3 volume. The wound is limited to skin breakdown. There is no tunneling or undermining noted. There is a medium amount of serosanguineous drainage noted. The wound margin is distinct with the outline attached to the wound base. There is medium (34-66%) pink, pale granulation within the wound bed. There is a small (1-33%) amount of necrotic tissue within the wound bed including Adherent Slough. The periwound skin appearance exhibited: Moist. The periwound skin appearance did not exhibit: Callus, Crepitus, Excoriation, Fluctuance, Friable, Induration, Localized Edema, Rash, Scarring, Dry/Scaly, Maceration, Atrophie Blanche, Cyanosis, Ecchymosis,  Hemosiderin Staining, Mottled, Pallor, Rubor, Erythema. Periwound temperature was noted as No Abnormality. The periwound has tenderness on palpation. Assessment Active Problems ICD-10 Z85.3 - Personal history of malignant neoplasm of breast C49.11 - Malignant neoplasm of connective and soft tissue of right upper limb, including shoulder S41.101A - Unspecified open wound of right upper arm, initial encounter S21.90XA - Unspecified open wound of unspecified part of thorax, initial encounter I97.2 - Postmastectomy lymphedema syndrome Plan Wound Cleansing: Wound #3 Right,Proximal Back: Cleanse wound with mild soap and water May Shower, gently pat wound dry prior to applying new dressing. Primary Wound Dressing: Wound #3 Right,Proximal Back: Aquacel Ag Secondary Dressing: Wound #3 Right,Proximal Back: Boardered Foam Dressing Dressing Change  Frequency: Wound #3 Right,Proximal Back: Change dressing every day. Follow-up Appointments: Wound #3 Right,Proximal Back: [name], [name] ([id number]) Return Appointment in: - 3 weeks Additional Orders / Instructions: Wound #3 Right,Proximal Back: Increase protein intake. Activity as tolerated Follow-Up Appointments: A Patient Clinical Summary of Care was provided to MF 1. no debridement recommended for right upper back wound. 2. discussed clinical findings and implications with pt and significant other. Electronic Signature(s) Signed: 08/19/2016 5:31:30 PM By: [name] Moh FNP Entered By: [name] on 08/19/2016 13:55:26 [name] ([id number]) -------------------------------------------------------------------------------- SuperBill Details Patient Name: [name] Date of Service: 08/19/2016 Medical Record Patient Account Number: [id number] Number: Afful, RN, BSN, Treating RN: Feb 21, 1956 (60 y.o. [name] Date of Birth/Sex: Female) Other Clinician: Primary Care Physician: [name] Treating [name] Referring Physician: Ricke [name] Physician/Extender: [name] in Treatment: 7 Diagnosis Coding ICD-10 Codes Code Description Z85.3 Personal history of malignant neoplasm of breast C49.11 Malignant neoplasm of connective and soft tissue of right upper limb, including shoulder S41.101A Unspecified open wound of right upper arm, initial encounter S21.90XA Unspecified open wound of unspecified part of thorax, initial encounter I97.2 Postmastectomy lymphedema syndrome Facility Procedures CPT4 Code: [id number] Description: [id number] - WOUND CARE VISIT-LEV 2 EST PT Modifier: Quantity: 1 Physician Procedures CPT4 Code Description: [id number] - WC PHYS LEVEL 2 - EST PT ICD-10 Description Diagnosis S21.90XA Unspecified open wound of unspecified part of thor S41.101A Unspecified open wound of right upper arm, initial Modifier: ax, initial en encounter Quantity: 1 counter Electronic Signature(s) Signed: 08/19/2016 5:31:30 PM By: [name] Moh FNP Entered By: [name] on 08/19/2016 13:55:51

## 2016-08-22 ENCOUNTER — Telehealth: Payer: Self-pay | Admitting: *Deleted

## 2016-08-22 ENCOUNTER — Other Ambulatory Visit: Payer: Self-pay | Admitting: *Deleted

## 2016-08-22 MED ORDER — METHADONE HCL 10 MG PO TABS: 10.0000 mg | ORAL_TABLET | Freq: Three times a day (TID) | ORAL | 0 refills | Status: DC

## 2016-08-22 NOTE — Telephone Encounter (Signed)
Patient called and stated that she needed some more Oxycodone 15mg  for her nerves. Pt stated that her  (ex husband) was sick and she was getting ready to leave town didn't know when she would return and need meds asap

## 2016-08-22 NOTE — Telephone Encounter (Signed)
This RN spoke with [name] today per her request for pain medications -  She states methadone 10 mg and oxy IR 15 mg " is what works best for me "  Prescriptions obtained and given to pt.

## 2016-08-30 ENCOUNTER — Ambulatory Visit: Payer: Commercial Managed Care - PPO

## 2016-08-30 ENCOUNTER — Other Ambulatory Visit: Payer: Self-pay | Admitting: *Deleted

## 2016-08-30 ENCOUNTER — Other Ambulatory Visit (HOSPITAL_BASED_OUTPATIENT_CLINIC_OR_DEPARTMENT_OTHER): Payer: Commercial Managed Care - PPO

## 2016-08-30 ENCOUNTER — Ambulatory Visit (HOSPITAL_BASED_OUTPATIENT_CLINIC_OR_DEPARTMENT_OTHER): Payer: Commercial Managed Care - PPO | Admitting: Oncology

## 2016-08-30 ENCOUNTER — Ambulatory Visit (HOSPITAL_BASED_OUTPATIENT_CLINIC_OR_DEPARTMENT_OTHER): Payer: Commercial Managed Care - PPO

## 2016-08-30 VITALS — BP 136/67 | HR 77 | Temp 98.3°F | Resp 18 | Ht 66.0 in | Wt 163.8 lb

## 2016-08-30 DIAGNOSIS — Z720 Tobacco use: Secondary | ICD-10-CM

## 2016-08-30 DIAGNOSIS — I890 Lymphedema, not elsewhere classified: Secondary | ICD-10-CM

## 2016-08-30 DIAGNOSIS — C50211 Malignant neoplasm of upper-inner quadrant of right female breast: Secondary | ICD-10-CM

## 2016-08-30 DIAGNOSIS — Z170 Estrogen receptor positive status [ER+]: Secondary | ICD-10-CM

## 2016-08-30 DIAGNOSIS — Z5112 Encounter for antineoplastic immunotherapy: Secondary | ICD-10-CM

## 2016-08-30 DIAGNOSIS — C778 Secondary and unspecified malignant neoplasm of lymph nodes of multiple regions: Secondary | ICD-10-CM

## 2016-08-30 DIAGNOSIS — C4911 Malignant neoplasm of connective and soft tissue of right upper limb, including shoulder: Secondary | ICD-10-CM

## 2016-08-30 DIAGNOSIS — Z5111 Encounter for antineoplastic chemotherapy: Secondary | ICD-10-CM

## 2016-08-30 DIAGNOSIS — Z95828 Presence of other vascular implants and grafts: Secondary | ICD-10-CM

## 2016-08-30 DIAGNOSIS — Z86718 Personal history of other venous thrombosis and embolism: Secondary | ICD-10-CM

## 2016-08-30 LAB — COMPREHENSIVE METABOLIC PANEL
ALT: 16 U/L (ref 0–55)
AST: 29 U/L (ref 5–34)
Albumin: 3.1 g/dL — ABNORMAL LOW (ref 3.5–5.0)
Alkaline Phosphatase: 98 U/L (ref 40–150)
Anion Gap: 9 mEq/L (ref 3–11)
BUN: 9.2 mg/dL (ref 7.0–26.0)
CO2: 28 mEq/L (ref 22–29)
Calcium: 8.6 mg/dL (ref 8.4–10.4)
Chloride: 107 mEq/L (ref 98–109)
Creatinine: 0.8 mg/dL (ref 0.6–1.1)
EGFR: >90 mL/min/{1.73_m2} (ref >90)
Glucose: 105 mg/dl (ref 70–140)
Potassium: 3.4 mEq/L — ABNORMAL LOW (ref 3.5–5.1)
Sodium: 143 mEq/L (ref 136–145)
Total Bilirubin: <0.22 mg/dL (ref 0.20–1.20)
Total Protein: 6.9 g/dL (ref 6.4–8.3)

## 2016-08-30 LAB — CBC WITH DIFFERENTIAL/PLATELET
BASO%: 0.6 % (ref 0.0–2.0)
Basophils Absolute: 0 10*3/uL (ref 0.0–0.1)
EOS%: 3.4 % (ref 0.0–7.0)
Eosinophils Absolute: 0.2 10*3/uL (ref 0.0–0.5)
HCT: 36.8 % (ref 34.8–46.6)
HGB: 11.7 g/dL (ref 11.6–15.9)
LYMPH%: 25 % (ref 14.0–49.7)
MCH: 26.5 pg (ref 25.1–34.0)
MCHC: 31.7 g/dL (ref 31.5–36.0)
MCV: 83.4 fL (ref 79.5–101.0)
MONO#: 0.6 10*3/uL (ref 0.1–0.9)
MONO%: 9 % (ref 0.0–14.0)
NEUT#: 4.2 10*3/uL (ref 1.5–6.5)
NEUT%: 62 % (ref 38.4–76.8)
Platelets: 192 10*3/uL (ref 145–400)
RBC: 4.41 10*6/uL (ref 3.70–5.45)
RDW: 19.7 % — ABNORMAL HIGH (ref 11.2–14.5)
WBC: 6.7 10*3/uL (ref 3.9–10.3)
lymph#: 1.7 10*3/uL (ref 0.9–3.3)

## 2016-08-30 MED ORDER — DIPHENHYDRAMINE HCL 25 MG PO CAPS
25.0000 mg | ORAL_CAPSULE | Freq: Once | ORAL | Status: AC
  Administered 2016-08-30: 25 mg via ORAL

## 2016-08-30 MED ORDER — ACETAMINOPHEN 325 MG PO TABS
ORAL_TABLET | ORAL | Status: AC
  Filled 2016-08-30: qty 2

## 2016-08-30 MED ORDER — SODIUM CHLORIDE 0.9% FLUSH
10.0000 mL | INTRAVENOUS | Status: DC | PRN
  Administered 2016-08-30: 10 mL
  Filled 2016-08-30: qty 10

## 2016-08-30 MED ORDER — PROCHLORPERAZINE MALEATE 10 MG PO TABS
10.0000 mg | ORAL_TABLET | Freq: Once | ORAL | Status: AC
  Administered 2016-08-30: 10 mg via ORAL

## 2016-08-30 MED ORDER — HEPARIN SOD (PORK) LOCK FLUSH 100 UNIT/ML IV SOLN
500.0000 [IU] | Freq: Once | INTRAVENOUS | Status: AC | PRN
  Administered 2016-08-30: 500 [IU]
  Filled 2016-08-30: qty 5

## 2016-08-30 MED ORDER — PROCHLORPERAZINE MALEATE 10 MG PO TABS
ORAL_TABLET | ORAL | Status: AC
  Filled 2016-08-30: qty 1

## 2016-08-30 MED ORDER — OXYCODONE HCL 15 MG PO TABS: 30.0000 mg | ORAL_TABLET | ORAL | 0 refills | Status: DC | PRN

## 2016-08-30 MED ORDER — ACETAMINOPHEN 325 MG PO TABS
650.0000 mg | ORAL_TABLET | Freq: Once | ORAL | Status: AC
  Administered 2016-08-30: 650 mg via ORAL

## 2016-08-30 MED ORDER — SODIUM CHLORIDE 0.9 % IJ SOLN
10.0000 mL | INTRAMUSCULAR | Status: DC | PRN
  Administered 2016-08-30: 10 mL via INTRAVENOUS
  Filled 2016-08-30: qty 10

## 2016-08-30 MED ORDER — PACLITAXEL PROTEIN-BOUND CHEMO INJECTION 100 MG
125.0000 mg/m2 | Freq: Once | INTRAVENOUS | Status: AC
  Administered 2016-08-30: 250 mg via INTRAVENOUS
  Filled 2016-08-30: qty 50

## 2016-08-30 MED ORDER — TRASTUZUMAB CHEMO 150 MG IV SOLR
6.0000 mg/kg | Freq: Once | INTRAVENOUS | Status: AC
  Administered 2016-08-30: 483 mg via INTRAVENOUS
  Filled 2016-08-30: qty 23

## 2016-08-30 MED ORDER — SODIUM CHLORIDE 0.9 % IV SOLN
Freq: Once | INTRAVENOUS | Status: AC
  Administered 2016-08-30: 12:00:00 via INTRAVENOUS

## 2016-08-30 MED ORDER — DIPHENHYDRAMINE HCL 25 MG PO CAPS
ORAL_CAPSULE | ORAL | Status: AC
  Filled 2016-08-30: qty 1

## 2016-08-30 NOTE — Progress Notes (Signed)
[facility]  [phone] [phone]  OFFICE PROGRESS NOTE   ID: [name]   DOB: Mar 19, 1956  MR#: [id number]  VZC#:[id number]   PCP: [name], MD [name], [name], [name] RAD JOI:NOMVEHM [name], [name] OTHER:  [name], MD, [name]  CHIEF COMPLAINT:  Stage IV Breast Cancer, angiosarcoma  CURRENT TREATMENT: Abraxane, trastuzumab, pertuzumab  BREAST CANCER HISTORY: From the original intake note:  The patient originally presented in May 2003 when she noticed a lump in the upper inner quadrant of her right breast in September 2003.  She sought attention and had a mammogram which showed an obvious carcinoma in the upper inner quadrant of the right breast, approximately 2 cm.  There were some enlarged lymph nodes in the axilla and an FNA done showed those consistent with malignant cells, most likely an invasive ductal carcinoma.  At that point she was unsure of what to do and was referred by Dr. [name] for a discussion of her treatment options.  By biopsy, it was ER/PR negative and HER-2 was 3+.  DNA index was 1.42.  We reiterated that it was most important to have her disease surgically addressed at that point and had recommended lumpectomy and axillary nodes to be addressed with which she followed through and on 04-03-02, Dr. [name] performed a right partial mastectomy and a right axillary lymph node dissection.  Final pathology revealed a 2.4 cm. high grade, Grade III invasive ductal carcinoma with an adjacent .8 cm. also high grade invasive ductal carcinoma which was felt to represent an intramammary metastasis rather than a second primary.  A smaller mass was just medial to the larger mass.  The smaller mass was associated with high grade DCIS component.  There was no definite lymphovascular invasion identified.  However, one of fourteen axillary lymph nodes did contain a 1.5 cm. metastatic  deposit.  Postoperatively she did very well.  We reiterated the fact that she did need adjuvant chemotherapy, however, she refused and decided that she would pursue radiation.  She received radiation and completed that on 08-14-02. Her subsequent history is as detailed below.   INTERVAL HISTORY: [name] returns today for treatment after being absent, visiting in [place] were her ex-husband she tells me he is now under hospice care.  [name] was very tearful today. She said she was counting and her daughter coming with her about her daughter couldn't come and [name] wanted to be treated tomorrow instead in case her daughter couldn't come then. She did bring me the papers from [facility] of [place] and what they treated her with was Taxol, Herceptin, progesterone, and Neulasta, which she received one time. She was then referred to hospice.  REVIEW OF SYSTEMS: [name] tells me her pain is moderately well controlled on her current medications. She denies constipation. She denies unusual headaches, visual changes, nausea, or vomiting. She is very upset because she has "bumps on her back". She wanted to know what those are. A detailed review of systems today was otherwise stable  PAST MEDICAL HISTORY: Past Medical History:  Diagnosis Date  . Breast cancer ([place])   . DVT (deep venous thrombosis) ([place]) 10/07/2011  . Hypertension   . Radiation 11/29/2005-12/29/2005   right supraclavicular area 4147 cGy  . Radiation 06/26/02-08/14/02   right breast 5040 cGy, tumor bed boosted to 1260 cGy  . Rheumatic fever     PAST SURGICAL HISTORY: Past Surgical History:  Procedure Laterality Date  .  BREAST SURGERY    . MASTECTOMY     bilateral  . PORT A CATH REVISION      FAMILY HISTORY Family History  Problem Relation Age of Onset  . Pancreatic cancer Mother   . Kidney cancer Sister 51  . Breast cancer Sister 70  . Breast cancer Other 52  The patient's father died in his 34L  from  complications of alcohol abuse. The patient's mother died in her 30s from pancreatic cancer. The patient has 6 brothers, 2 sisters. Both her sisters have had breast cancer, both diagnosed after the age of 13. The patient has not had genetic testing so far.   GYNECOLOGIC HISTORY: Menarche age 78; first live birth age 41. She is GXP4. She is not sure when she stopped having periods. She never used hormone replacement.  SOCIAL HISTORY: The patient has worked in the past as a Psychologist, counselling. At home in addition to the patient are her husband [name], originally from [place], who works as a Administrator.  Also living in the home is her son [name] and her daughter [name] (she is studying to be a Marine scientist).  The other 2 children are [name], who has a college degree in psychology and works as a Probation officer; and [name], who lives in [place] and works as a Higher education careers adviser.   ADVANCED DIRECTIVES: Not in place  HEALTH MAINTENANCE: Social History  Substance Use Topics  . Smoking status: Light Tobacco Smoker  . Smokeless tobacco: Never Used  . Alcohol use Yes     Comment: Rarely     Colonoscopy:  Not on file  PAP:  Not on file  Bone density:  Not on file  Lipid panel:  Not on file    Allergies  Allergen Reactions  . Tramadol Nausea Only  . Hydrocodone-Acetaminophen Itching and Rash    Current Outpatient Prescriptions  Medication Sig Dispense Refill  . alprazolam (XANAX) 2 MG tablet Take 2 mg by mouth daily.     . Ascorbic Acid (VITAMIN C) 100 MG tablet Take 100 mg by mouth daily.      . calcium-vitamin D (OSCAL WITH D) 500-200 MG-UNIT per tablet Take 1 tablet by mouth daily.      Marland Kitchen dextromethorphan-guaiFENesin (MUCINEX DM) 30-600 MG 12hr tablet Take 1 tablet by mouth 2 (two) times daily. 14 tablet 0  . diphenoxylate-atropine (LOMOTIL) 2.5-0.025 MG tablet Take 1 tablet by mouth 4 (four) times daily as needed for diarrhea or loose stools. 60 tablet 1  . gabapentin (NEURONTIN) 300 MG capsule Take  1 capsule (300 mg total) by mouth at bedtime. 90 capsule 4  . HYDROcodone-homatropine (HYCODAN) 5-1.5 MG/5ML syrup Take 5 mLs by mouth every 6 (six) hours as needed for cough. 120 mL 0  . methadone (DOLOPHINE) 10 MG tablet Take 1 tablet (10 mg total) by mouth every 8 (eight) hours. 90 tablet 0  . Multiple Vitamin (MULTIVITAMIN) capsule Take 1 capsule by mouth daily.      . ondansetron (ZOFRAN) 8 MG tablet TAKE ONE TABLET BY MOUTH TWICE DAILY AS NEEDED FOR NAUSEA AND VOMITING 30 tablet 1  . oxyCODONE (ROXICODONE) 15 MG immediate release tablet Take 2 tablets (30 mg total) by mouth every 3 (three) hours as needed for pain. 100 tablet 0  . predniSONE (DELTASONE) 5 MG tablet Take 2 tablets (10 mg total) by mouth daily with breakfast. 60 tablet 6  . prochlorperazine (COMPAZINE) 10 MG tablet Take 1 tablet (10 mg total) by mouth every 6 (six) hours  as needed (Nausea or vomiting). 30 tablet 1  . valACYclovir (VALTREX) 500 MG tablet Take 1 tablet (500 mg total) by mouth 2 (two) times daily. 30 tablet 12   No current facility-administered medications for this visit.    Facility-Administered Medications Ordered in Other Visits  Medication Dose Route Frequency Provider Last Rate Last Dose  . sodium chloride 0.9 % injection 10 mL  10 mL Intravenous PRN Chauncey Cruel, MD   10 mL at 03/04/14 1421    OBJECTIVE: Middle-aged [place] American woman Who was tearful today  Vitals:   08/30/16 1053  BP: 136/67  Pulse: 77  Resp: 18  Temp: 98.3 F (36.8 C)     Body mass index is 26.44 kg/m.    ECOG FS: 2 Filed Weights   08/30/16 1053  Weight: 163 lb 12.8 oz (74.3 kg)    Sclerae unicteric, pupils round and equal Oropharynx clear and moist-- no thrush or other lesions The lesions in the upper right posterior shoulder were bandaged over Lungs no rales or rhonchi Heart regular rate and rhythm Abd soft, nontender, positive bowel sounds MSK no focal spinal tenderness, no upper extremity  lymphedema Neuro: nonfocal, well oriented, distraught affect Skin: Images of her right upper extremity and her upper back are as below   Photo Right upper arm 08/30/2016     Right upper extremity 07/19/2016    Upper right back 07/19/2016.    LAB RESULTS:.  Lab Results  Component Value Date   WBC 6.7 08/30/2016   NEUTROABS 4.2 08/30/2016   HGB 11.7 08/30/2016   HCT 36.8 08/30/2016   MCV 83.4 08/30/2016   PLT 192 08/30/2016      Chemistry      Component Value Date/Time   NA 143 08/30/2016 1025   K 3.4 (L) 08/30/2016 1025   CL 100 08/14/2014 0100   CL 101 03/07/2013 1411   CO2 28 08/30/2016 1025   BUN 9.2 08/30/2016 1025   CREATININE 0.8 08/30/2016 1025      Component Value Date/Time   CALCIUM 8.6 08/30/2016 1025   ALKPHOS 98 08/30/2016 1025   AST 29 08/30/2016 1025   ALT 16 08/30/2016 1025   BILITOT <0.22 08/30/2016 1025       STUDIES: No results found.   ASSESSMENT:  60 y.o. [place] woman with stage IV breast cancer manifested chiefly by loco-regional nodal disease (neck, chest), without lung, liver, bone, or brain involvement.  (1) Status post right upper inner quadrant lumpectomy and sentinel lymph node sampling 03/04/2002 for 2 separate foci of invasive ductal carcinoma, mpT2 pN1 or stage IIB, both foci grade 3, both estrogen and progesterone receptor positive, both HercepTest 3+, Mib-1 56%  (2) Reexcision for margins 05/27/2002 showed no residual cancer in the breast.  (3) The patient refused adjuvant systemic therapy.  (4) Adjuvant radiation treatment completed 08/14/2002.  (5) recurrence in the right breast in 02/2004 showing a morphologically different tumor, again grade 3, again estrogen and progesterone receptor negative, with an MIB-1 of 14% and Herceptest 3+.  (5) Between 03/2004 and 07/2004 she received dose dense Doxorubicin/Cyclophosphamide x 4 given with trastuzumab, followed by weekly Docetaxel x 8, again given with  trastuzumab.  (6) Right mastectomy 07/13/2004 showed scattered microscopic foci of residual disease over an area  greater than 5 cm. Margins were negative.  (7) Postoperative Docetaxel continued until 09/2004.  (8) Trastuzumab (Herceptin) given 08/2004 through 01/2012 with some brief interruptions.  (9) Isolated right cervical nodal recurrence 10/2005, treated with radiation  to the right supraclavicular area (total 41.5 gray) completed 12/29/2005.  (10) Navelbine given together with Herceptin  11/2005 through 03/2006.  (9) Left mastectomy 02/13/2006 for ductal carcinoma in situ, grade 2, estrogen and progesterone receptor negative, with negative margins; 0 of 3 lymph nodes involved  (10) treated with Lapatinib and Capecitabine before 10/2009, for an unclear duration and with unclear results (cannot locate data on chart review).  (11) Status post right supraclavicular lymph node biopsy 09/2010 again positive for an invasive ductal carcinoma, estrogen and progesterone receptor negative, HER-2 positive by CISH with a ratio 4.25.  (12) Navelbine given together with Herceptin between 05/2011 and 11/2011.  (13) Carboplatin/ Gemcitabine/ Herceptin given for 2 cycles, in 12/2011 and 01/2012.  (14) TDM-1 ([place]) started 02/2012. Last dose 10/02/2013 after which the patient discontinued treatment at her own discretion. Echo on December 2014 showed a well preserved ejection fraction.  (15) Deep vein thrombosis of the right upper extremity documented 04/20/2011.  She completed anticoagulant therapy with Coumadin on 03/25/2013.  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 Left chest Port-A-Cath placed on 04/08/2013; being flushed every 6 weeks  RIGHT UPPER  EXTREMITY ANGIOSARCOMA VS BREAST CANCER: (18) treated at [facility] [place] August 2016 with paclitaxel, trastuzumab and pertuzumab 1 cycle, afterwards referred to hospice  (19) under the care of hospice of [facility] August 2016 05/08/2016, when the patient opted for a second try at chemotherapy  (20) started low-dose Abraxane, trastuzumab and pertuzumab 06/07/2016, to be repeated every 3 weeks.   (a) pretreatment echocardiogram 06/06/2016 showed a 60-65% ejection fraction  (b) pertuzumab discontinued beginning September 2017 because of poor tolerance   PLAN: Cody seems to me to be having a very good response to her chemotherapy. Furthermore she tolerates it well. Compliance continues to be a problem and I have urged her to make sure to be here every 21 days as scheduled  Unfortunately she was smoking in the bathroom today during the treatment area. She was admonished regarding that as well. She was offered counseling but at this point declines  The good news is that she is better. Things at home continue to be somewhat chaotic. Pain seems to be well-controlled on her current medications. We have placed an in-home palliative care referral so we would have some information as to what goes on in the house but she apparently has never been available for a visit.  The plan is to continue chemotherapy as planned, every 21 days, and she will continue to obtain services through the wound clinic. She will see me again in 6 weeks. She knows to call for any problems that may develop before that visit   Chauncey Cruel, MD  08/30/2016 7:19 PM

## 2016-09-02 ENCOUNTER — Other Ambulatory Visit: Payer: Self-pay

## 2016-09-02 MED ORDER — TRIAMTERENE-HCTZ 37.5-25 MG PO CAPS: 1.0000 | ORAL_CAPSULE | Freq: Every morning | ORAL | 1 refills | Status: DC

## 2016-09-05 ENCOUNTER — Other Ambulatory Visit: Payer: Self-pay

## 2016-09-05 DIAGNOSIS — C50211 Malignant neoplasm of upper-inner quadrant of right female breast: Secondary | ICD-10-CM

## 2016-09-05 MED ORDER — OXYCODONE HCL 15 MG PO TABS: 30.0000 mg | ORAL_TABLET | ORAL | 0 refills | Status: DC | PRN

## 2016-09-07 NOTE — Addendum Note (Signed)
Addended by: Laureen Abrahams on: 09/07/2016 12:39 PM   Modules accepted: Orders

## 2016-09-12 ENCOUNTER — Telehealth: Payer: Self-pay | Admitting: *Deleted

## 2016-09-12 NOTE — Telephone Encounter (Signed)
Call from pt reporting her husband has died. She has to travel to [place] to take care of his affairs there. She leaves on 11/7 and is unsure when she can be back. Needs refill on Methadone and Oxycodone since she will be out of town. Requesting to be called when prescriptions are ready.

## 2016-09-13 ENCOUNTER — Other Ambulatory Visit: Payer: Self-pay | Admitting: *Deleted

## 2016-09-13 DIAGNOSIS — C50211 Malignant neoplasm of upper-inner quadrant of right female breast: Secondary | ICD-10-CM

## 2016-09-13 MED ORDER — METHADONE HCL 10 MG PO TABS: 10.0000 mg | ORAL_TABLET | Freq: Three times a day (TID) | ORAL | 0 refills | Status: DC

## 2016-09-13 MED ORDER — OXYCODONE HCL 15 MG PO TABS: 30.0000 mg | ORAL_TABLET | ORAL | 0 refills | Status: DC | PRN

## 2016-09-16 ENCOUNTER — Ambulatory Visit: Payer: Commercial Managed Care - PPO | Admitting: Surgery

## 2016-09-20 ENCOUNTER — Other Ambulatory Visit: Payer: Self-pay | Admitting: Oncology

## 2016-09-20 ENCOUNTER — Other Ambulatory Visit: Payer: Commercial Managed Care - PPO

## 2016-09-20 ENCOUNTER — Ambulatory Visit: Payer: Commercial Managed Care - PPO

## 2016-09-22 ENCOUNTER — Ambulatory Visit: Payer: Commercial Managed Care - PPO | Attending: Oncology | Admitting: Physical Therapy

## 2016-09-22 DIAGNOSIS — I890 Lymphedema, not elsewhere classified: Secondary | ICD-10-CM | POA: Insufficient documentation

## 2016-09-22 DIAGNOSIS — I972 Postmastectomy lymphedema syndrome: Secondary | ICD-10-CM | POA: Insufficient documentation

## 2016-09-22 DIAGNOSIS — E8989 Other postprocedural endocrine and metabolic complications and disorders: Secondary | ICD-10-CM | POA: Insufficient documentation

## 2016-09-22 NOTE — Therapy (Signed)
[place], [place], [address] Phone: [phone]   Fax:  [phone]  Physical Therapy Evaluation  Patient Details  Name: [name] MRN: [id number] Date of Birth: Dec 04, 1955 No Data Recorded  Encounter Date: 09/22/2016    Past Medical History:  Diagnosis Date  . Breast cancer ([place])   . DVT (deep venous thrombosis) ([place]) 10/07/2011  . Hypertension   . Radiation 11/29/2005-12/29/2005   right supraclavicular area 4147 cGy  . Radiation 06/26/02-08/14/02   right breast 5040 cGy, tumor bed boosted to 1260 cGy  . Rheumatic fever     Past Surgical History:  Procedure Laterality Date  . BREAST SURGERY    . MASTECTOMY     bilateral  . PORT A CATH REVISION      There were no vitals filed for this visit.   Patient came in today but was not sure why she was here.  We had gotten a referral for her on 06/08/16; notes from her medical oncology visits did not mention why she was being referred.  The patient feels here wounds and lymphedema are doing well.  She shows that her skin is in pretty good shape on the arm and the right upper back.  Her arm circumferences were not measured today, but her arm looks much better than it had in quite some while when we had treated her in the past, no doubt from the chemo removing some of the lymph blockages.  Neither patient nor therapist feels she needs a course of physical therapy at this time.  Serafina Royals, PT 09/22/16 1:42 PM                                      Patient will benefit from skilled therapeutic intervention in order to improve the following deficits and impairments:     Visit Diagnosis: Postmastectomy lymphedema  Post-lymphadenectomy lymphedema of arm     Problem List Patient Active Problem List   Diagnosis Date Noted  . Port catheter in place 05/30/2016  . Primary angiosarcoma of right upper extremity  ([place]) 05/14/2015  . Chronic pain 04/13/2015  . Left arm pain 08/18/2014  . Breast cancer of upper-inner quadrant of right female breast ([place]) 10/03/2013  . Post-lymphadenectomy lymphedema of arm 08/15/2013  . Port or reservoir infection 01/29/2013  . DVT (deep venous thrombosis) ([place]) 10/07/2011  . Chest pain 09/10/2009  . Secondary cardiomyopathy ([place]) 09/02/2009  . Essential hypertension 02/26/2009  . GERD 02/26/2009    [name] 09/22/2016, 1:39 PM  [place], [place], [address] Phone: [phone]   Fax:  [phone]  Name: Aaradhana Sardo MRN: [id number] Date of Birth: [birth year]

## 2016-09-24 ENCOUNTER — Other Ambulatory Visit: Payer: Self-pay | Admitting: Oncology

## 2016-09-24 NOTE — Progress Notes (Signed)
+  I received an undated note from [facility], Ms Lewinski's daughter [phone] stating she gives her mother the meds because her mother gets confused and requesting oxycodone 15 mg. I have separately scannned the letter

## 2016-09-26 ENCOUNTER — Ambulatory Visit: Payer: Commercial Managed Care - PPO

## 2016-09-26 ENCOUNTER — Telehealth: Payer: Self-pay | Admitting: *Deleted

## 2016-09-26 ENCOUNTER — Other Ambulatory Visit (HOSPITAL_COMMUNITY)
Admission: RE | Admit: 2016-09-26 | Discharge: 2016-09-26 | Disposition: A | Discharge status: Home / Self Care | Payer: Commercial Managed Care - PPO | Source: Ambulatory Visit | Attending: Oncology | Admitting: Oncology

## 2016-09-26 ENCOUNTER — Other Ambulatory Visit: Payer: Self-pay | Admitting: *Deleted

## 2016-09-26 DIAGNOSIS — Z7189 Other specified counseling: Secondary | ICD-10-CM

## 2016-09-26 DIAGNOSIS — C50211 Malignant neoplasm of upper-inner quadrant of right female breast: Secondary | ICD-10-CM

## 2016-09-26 LAB — RAPID URINE DRUG SCREEN, HOSP PERFORMED
Amphetamines: NOT DETECTED
Barbiturates: NOT DETECTED
Benzodiazepines: POSITIVE — AB
Cocaine: NOT DETECTED
Opiates: POSITIVE — AB
Tetrahydrocannabinol: NOT DETECTED

## 2016-09-26 MED ORDER — METHADONE HCL 5 MG PO TABS: 5.0000 mg | ORAL_TABLET | Freq: Three times a day (TID) | ORAL | 0 refills | Status: DC

## 2016-09-26 MED ORDER — OXYCODONE HCL 15 MG PO TABS: 30.0000 mg | ORAL_TABLET | ORAL | 0 refills | Status: DC | PRN

## 2016-09-26 NOTE — Telephone Encounter (Signed)
This RN spoke with pt per walk in to this office requesting refill on oxy IR.  This RN reviewed with pt recent pain medication refills documented per [place] controlled substance report system -  With noted dispense of percocet # 120 tabs on 09/21/2016 ordered by Dr [name].  Nikie states above causes increased swelling in her right arm with known lymphedema and she would prefer to use oxy IR.  This RN explained concern per MD of dispensing additional opioids with pt having the above med in her home.  Goal of this office is to provide safe and appropriate pain management.  Due to above inquiry as well as Mayrene not able to give good history - request is to obtain urine for verification of opioids in system. Above is requested for appropriate titration of drugs with least side effects.  Also informed pt that she would need to surrender her current bottle of percocet for oxy IR to be dispensed.   This RN validated pt's concerns stating " you don't believe me " - and again reiterated this office knows she has known reason for pain, [name] is willing to help her with pain management which includes safety concerns as noted above.  Jakelin verbalized agreement. Urine obtained for drug screening with results showing validation for opioid use.  New prescriptions given for oxy IR and additional methadone 5 mg to equal dose of methadone to 15 mg TID.  This RN also called [facility] and informed pharmacist of above interaction with need for pt to surrender percocet for oxy IR to be dispensed.

## 2016-09-30 ENCOUNTER — Other Ambulatory Visit: Payer: Self-pay | Admitting: Oncology

## 2016-10-06 ENCOUNTER — Telehealth: Payer: Self-pay | Admitting: *Deleted

## 2016-10-06 ENCOUNTER — Ambulatory Visit (HOSPITAL_BASED_OUTPATIENT_CLINIC_OR_DEPARTMENT_OTHER): Payer: Commercial Managed Care - PPO | Admitting: Oncology

## 2016-10-06 VITALS — BP 155/63 | HR 68 | Temp 98.1°F | Resp 18 | Ht 66.0 in | Wt 171.4 lb

## 2016-10-06 DIAGNOSIS — Z170 Estrogen receptor positive status [ER+]: Secondary | ICD-10-CM

## 2016-10-06 DIAGNOSIS — L988 Other specified disorders of the skin and subcutaneous tissue: Secondary | ICD-10-CM | POA: Diagnosis not present

## 2016-10-06 DIAGNOSIS — C4911 Malignant neoplasm of connective and soft tissue of right upper limb, including shoulder: Secondary | ICD-10-CM

## 2016-10-06 DIAGNOSIS — I890 Lymphedema, not elsewhere classified: Secondary | ICD-10-CM

## 2016-10-06 DIAGNOSIS — C50211 Malignant neoplasm of upper-inner quadrant of right female breast: Secondary | ICD-10-CM | POA: Diagnosis not present

## 2016-10-06 DIAGNOSIS — C778 Secondary and unspecified malignant neoplasm of lymph nodes of multiple regions: Secondary | ICD-10-CM | POA: Diagnosis not present

## 2016-10-06 DIAGNOSIS — Z720 Tobacco use: Secondary | ICD-10-CM

## 2016-10-06 DIAGNOSIS — Z86718 Personal history of other venous thrombosis and embolism: Secondary | ICD-10-CM

## 2016-10-06 MED ORDER — METHADONE HCL 10 MG PO TABS: 10.0000 mg | ORAL_TABLET | Freq: Three times a day (TID) | ORAL | 0 refills | Status: DC

## 2016-10-06 MED ORDER — METHADONE HCL 5 MG PO TABS: 5.0000 mg | ORAL_TABLET | Freq: Three times a day (TID) | ORAL | 0 refills | Status: DC

## 2016-10-06 NOTE — Progress Notes (Signed)
[place]  [phone] [phone]  OFFICE PROGRESS NOTE   ID: Juaquina Machnik   DOB: 13-Apr-1956  MR#: [id number]  BXU#:[id number]   PCP: [name], MD [name], [name], [name] RAD VBT:[id number] [name], [name] OTHER:  [name], MD, [name]  CHIEF COMPLAINT:  Stage IV Breast Cancer, angiosarcoma  CURRENT TREATMENT: Abraxane, trastuzumab, pertuzumab  BREAST CANCER HISTORY: From the original intake note:  The patient originally presented in May 2003 when she noticed a lump in the upper inner quadrant of her right breast in September 2003.  She sought attention and had a mammogram which showed an obvious carcinoma in the upper inner quadrant of the right breast, approximately 2 cm.  There were some enlarged lymph nodes in the axilla and an FNA done showed those consistent with malignant cells, most likely an invasive ductal carcinoma.  At that point she was unsure of what to do and was referred by Dr. [name] for a discussion of her treatment options.  By biopsy, it was ER/PR negative and HER-2 was 3+.  DNA index was 1.42.  We reiterated that it was most important to have her disease surgically addressed at that point and had recommended lumpectomy and axillary nodes to be addressed with which she followed through and on 04-03-02, Dr. [name] performed a right partial mastectomy and a right axillary lymph node dissection.  Final pathology revealed a 2.4 cm. high grade, Grade III invasive ductal carcinoma with an adjacent .8 cm. also high grade invasive ductal carcinoma which was felt to represent an intramammary metastasis rather than a second primary.  A smaller mass was just medial to the larger mass.  The smaller mass was associated with high grade DCIS component.  There was no definite lymphovascular invasion identified.  However, one of fourteen axillary lymph nodes did contain a 1.5 cm. metastatic  deposit.  Postoperatively she did very well.  We reiterated the fact that she did need adjuvant chemotherapy, however, she refused and decided that she would pursue radiation.  She received radiation and completed that on 08-14-02. Her subsequent history is as detailed below.   INTERVAL HISTORY: Rashawnda was scheduled to see me today, then we can recall that she canceled, then she showed up for the visit. She has not been treated here since late 07-Sep-2023. Her husband died after masses stroke in [place] and she was visiting they're trying to get the needle things straightened out, she says she is scheduled to have her next treatment 10/11/2016.  REVIEW OF SYSTEMS: Merina tells me she does not like her chemotherapy. She thinks it is not working. The other hand when she does receive it her skin heals up and her bumps received. She's gained a little weight. She continues to have significant pain. She received 120 oxycodone/acetaminophen tablets from Dr. [name] on 09/21/2016. She received 60 methadone tablets from [name] 09/26/2016. She tells me she has been taking the 10 mg at that on tablets 3 times a day and that it is not really controlling her pain. She denies constipation. She says sometimes she feels very forgetful but she denies confusion or sleepiness. She denies nausea. She remains very concerned about the "bumps" on her back A detailed review of systems today was otherwise stable  PAST MEDICAL HISTORY: Past Medical History:  Diagnosis Date  . Breast cancer ([place])   . DVT (deep venous thrombosis) ([place]) 10/07/2011  . Hypertension   . Radiation 11/29/2005-12/29/2005  right supraclavicular area 4147 cGy  . Radiation 06/26/02-08/14/02   right breast 5040 cGy, tumor bed boosted to 1260 cGy  . Rheumatic fever     PAST SURGICAL HISTORY: Past Surgical History:  Procedure Laterality Date  . BREAST SURGERY    . MASTECTOMY     bilateral  . PORT A CATH REVISION      FAMILY HISTORY Family  History  Problem Relation Age of Onset  . Pancreatic cancer Mother   . Kidney cancer Sister 58  . Breast cancer Sister 60  . Breast cancer Other 25  The patient's father died in his 24Q  from complications of alcohol abuse. The patient's mother died in her 22s from pancreatic cancer. The patient has 6 brothers, 2 sisters. Both her sisters have had breast cancer, both diagnosed after the age of 69. The patient has not had genetic testing so far.   GYNECOLOGIC HISTORY: Menarche age 18; first live birth age 73. She is GXP4. She is not sure when she stopped having periods. She never used hormone replacement.  SOCIAL HISTORY: The patient has worked in the past as a Psychologist, counselling. At home in addition to the patient are her husband [name], originally from [place], who works as a Administrator.  Also living in the home is her son [name] and her daughter [name] (she is studying to be a Marine scientist).  The other 2 children are [name], who has a college degree in psychology and works as a Probation officer; and [name], who lives in [place] and works as a Higher education careers adviser.   ADVANCED DIRECTIVES: Not in place  HEALTH MAINTENANCE: Social History  Substance Use Topics  . Smoking status: Light Tobacco Smoker  . Smokeless tobacco: Never Used  . Alcohol use Yes     Comment: Rarely     Colonoscopy:  Not on file  PAP:  Not on file  Bone density:  Not on file  Lipid panel:  Not on file    Allergies  Allergen Reactions  . Tramadol Nausea Only  . Hydrocodone-Acetaminophen Itching and Rash    Current Outpatient Prescriptions  Medication Sig Dispense Refill  . alprazolam (XANAX) 2 MG tablet Take 2 mg by mouth daily.     . Ascorbic Acid (VITAMIN C) 100 MG tablet Take 100 mg by mouth daily.      . calcium-vitamin D (OSCAL WITH D) 500-200 MG-UNIT per tablet Take 1 tablet by mouth daily.      . diphenoxylate-atropine (LOMOTIL) 2.5-0.025 MG tablet Take 1 tablet by mouth 4 (four) times daily as needed for diarrhea or  loose stools. 60 tablet 1  . gabapentin (NEURONTIN) 300 MG capsule Take 1 capsule (300 mg total) by mouth at bedtime. 90 capsule 4  . HYDROcodone-homatropine (HYCODAN) 5-1.5 MG/5ML syrup Take 5 mLs by mouth every 6 (six) hours as needed for cough. 120 mL 0  . methadone (DOLOPHINE) 10 MG tablet Take 1 tablet (10 mg total) by mouth every 8 (eight) hours. 90 tablet 0  . methadone (DOLOPHINE) 5 MG tablet Take 1 tablet (5 mg total) by mouth every 8 (eight) hours. Take with 10 mg tablets to equal dose of 15 mg every 8 hours 60 tablet 0  . Multiple Vitamin (MULTIVITAMIN) capsule Take 1 capsule by mouth daily.      . mupirocin ointment (BACTROBAN) 2 % APPLY ON THE SKIN TWICE A DAY  3  . ondansetron (ZOFRAN) 8 MG tablet TAKE ONE TABLET BY MOUTH TWICE DAILY AS NEEDED FOR NAUSEA AND  VOMITING 30 tablet 1  . oxyCODONE (OXY IR/ROXICODONE) 5 MG immediate release tablet     . oxyCODONE (ROXICODONE) 15 MG immediate release tablet Take 2 tablets (30 mg total) by mouth every 3 (three) hours as needed for pain. 100 tablet 0  . predniSONE (DELTASONE) 5 MG tablet Take 2 tablets (10 mg total) by mouth daily with breakfast. 60 tablet 6  . prochlorperazine (COMPAZINE) 10 MG tablet Take 1 tablet (10 mg total) by mouth every 6 (six) hours as needed (Nausea or vomiting). 30 tablet 1  . sertraline (ZOLOFT) 50 MG tablet Take 50 mg by mouth daily.  1  . triamterene-hydrochlorothiazide (DYAZIDE) 37.5-25 MG capsule Take 1 each (1 capsule total) by mouth every morning. 30 capsule 1  . valACYclovir (VALTREX) 500 MG tablet Take 1 tablet (500 mg total) by mouth 2 (two) times daily. 30 tablet 12   No current facility-administered medications for this visit.    Facility-Administered Medications Ordered in Other Visits  Medication Dose Route Frequency Provider Last Rate Last Dose  . sodium chloride 0.9 % injection 10 mL  10 mL Intravenous PRN Chauncey Cruel, MD   10 mL at 03/04/14 1421    OBJECTIVE: Middle-aged African American  woman   Vitals:   10/06/16 1025  BP: (!) 155/63  Pulse: 68  Resp: 18  Temp: 98.1 F (36.7 C)     Body mass index is 27.66 kg/m.    ECOG FS: 2 Filed Weights   10/06/16 1025  Weight: 171 lb 6.4 oz (77.7 kg)    Sclerae unicteric, EOMs intact Oropharynx clear and moist Lungs no rales or rhonchi Heart regular rate and rhythm Abd soft, nontender, positive bowel sounds MSK no focal spinal tenderness Neuro: nonfocal, well oriented, appropriate affect  Back and left arm 10/06/2016    Right upper extremity 07/19/2016    LAB RESULTS:.  Lab Results  Component Value Date   WBC 6.7 08/30/2016   NEUTROABS 4.2 08/30/2016   HGB 11.7 08/30/2016   HCT 36.8 08/30/2016   MCV 83.4 08/30/2016   PLT 192 08/30/2016      Chemistry      Component Value Date/Time   NA 143 08/30/2016 1025   K 3.4 (L) 08/30/2016 1025   CL 100 08/14/2014 0100   CL 101 03/07/2013 1411   CO2 28 08/30/2016 1025   BUN 9.2 08/30/2016 1025   CREATININE 0.8 08/30/2016 1025      Component Value Date/Time   CALCIUM 8.6 08/30/2016 1025   ALKPHOS 98 08/30/2016 1025   AST 29 08/30/2016 1025   ALT 16 08/30/2016 1025   BILITOT <0.22 08/30/2016 1025       STUDIES: No results found.   ASSESSMENT:  60 y.o. [place] woman with stage IV breast cancer manifested chiefly by loco-regional nodal disease (neck, chest), without lung, liver, bone, or brain involvement.  (1) Status post right upper inner quadrant lumpectomy and sentinel lymph node sampling 03/04/2002 for 2 separate foci of invasive ductal carcinoma, mpT2 pN1 or stage IIB, both foci grade 3, both estrogen and progesterone receptor positive, both HercepTest 3+, Mib-1 56%  (2) Reexcision for margins 05/27/2002 showed no residual cancer in the breast.  (3) The patient refused adjuvant systemic therapy.  (4) Adjuvant radiation treatment completed 08/14/2002.  (5) recurrence in the right breast in 02/2004 showing a morphologically different tumor,  again grade 3, again estrogen and progesterone receptor negative, with an MIB-1 of 14% and Herceptest 3+.  (5) Between 03/2004 and 07/2004  she received dose dense Doxorubicin/Cyclophosphamide x 4 given with trastuzumab, followed by weekly Docetaxel x 8, again given with trastuzumab.  (6) Right mastectomy 07/13/2004 showed scattered microscopic foci of residual disease over an area  greater than 5 cm. Margins were negative.  (7) Postoperative Docetaxel continued until 09/2004.  (8) Trastuzumab (Herceptin) given 08/2004 through 01/2012 with some brief interruptions.  (9) Isolated right cervical nodal recurrence 10/2005, treated with radiation to the right supraclavicular area (total 41.5 gray) completed 12/29/2005.  (10) Navelbine given together with Herceptin  11/2005 through 03/2006.  (9) Left mastectomy 02/13/2006 for ductal carcinoma in situ, grade 2, estrogen and progesterone receptor negative, with negative margins; 0 of 3 lymph nodes involved  (10) treated with Lapatinib and Capecitabine before 10/2009, for an unclear duration and with unclear results (cannot locate data on chart review).  (11) Status post right supraclavicular lymph node biopsy 09/2010 again positive for an invasive ductal carcinoma, estrogen and progesterone receptor negative, HER-2 positive by CISH with a ratio 4.25.  (12) Navelbine given together with Herceptin between 05/2011 and 11/2011.  (13) Carboplatin/ Gemcitabine/ Herceptin given for 2 cycles, in 12/2011 and 01/2012.  (14) TDM-1 ([place]) started 02/2012. Last dose 10/02/2013 after which the patient discontinued treatment at her own discretion. Echo on December 2014 showed a well preserved ejection fraction.  (15) Deep vein thrombosis of the right upper extremity documented 04/20/2011.  She completed anticoagulant therapy with Coumadin on 03/25/2013.  (16) Chronic right upper extremity lymphedema, not responsive to aggressive PT  (a) biopsy of denuded  area 04/23/2015 read as dermatofibroma (discordant)  (b) deeper cuts of 04/23/2015 biopsy suggest angiosarcoma  (17) Right chest port-a-cath removal due to infection on 01/28/2013. Left chest Port-A-Cath placed on 04/08/2013; being flushed every 6 weeks  RIGHT UPPER EXTREMITY ANGIOSARCOMA VS BREAST CANCER: (18) treated at [facility] [place] August 2016 with paclitaxel, trastuzumab and pertuzumab 1 cycle, afterwards referred to hospice  (19) under the care of hospice of [facility] August 2016  To 05/08/2016, when the patient opted for a second try at chemotherapy  (20) started low-dose Abraxane, trastuzumab and pertuzumab 06/07/2016, to be repeated every 3 weeks.   (a) pretreatment echocardiogram 06/06/2016 showed a 60-65% ejection fraction  (b) pertuzumab discontinued beginning September 2017 because of poor tolerance   PLAN: Jayci 's situation remains fairly chaotic, with her missing treatments, showing up at times when she does not have appointments, and her receiving pain medicine from more than one physician. It is very difficult to know exactly what she is taking when she is taking it and exactly how much benefit she is getting from them.  I do think when she receives her chemotherapy and antibodies on a regular basis her cancer improves, her skin bumps recede, and her skin heals up. I strongly urged her to get treated every 3 weeks, with her next treatment being 10/11/2016.  Today I refilled her methadone so she can take 15 mg 3 times a day for the next 10 days. When she returns here 10/11/2016 if she tells me she has tolerated this well and it is still not controlling the pain I will write her for 20 mg tablets and I will give her a month's supply, to get her through the holidays.  I am not writing her for any oxycodone since Dr. [name] is riding dose for her.  It would be very helpful if she allowed in home palliative care to come by. We will place that referral again.  They have  agreed to see her with that have had a very hard time getting an appointment. It would be helpful to know exactly what is going on at home and what medications she actually is on when she gets visited  Otherwise she is going to see me again in January, with continuing treatments every 3 weeks as noted.  Chauncey Cruel, MD  10/06/2016 10:51 AM

## 2016-10-06 NOTE — Telephone Encounter (Signed)
"  I have an appointment today.  Will I receive chemotherapy?"   No, today is provider only. "I want to cancel today and will be in next week to see doctor with treatment." Asked how she is feeling, is anything going on we need to know about. "I fell Sunday, my left arm is bruised and I just started feeling it yesterday."  Advised she come in for evaluation.  "I'll pass."

## 2016-10-11 ENCOUNTER — Ambulatory Visit: Payer: Commercial Managed Care - PPO | Admitting: Oncology

## 2016-10-11 ENCOUNTER — Ambulatory Visit (HOSPITAL_BASED_OUTPATIENT_CLINIC_OR_DEPARTMENT_OTHER): Payer: Commercial Managed Care - PPO

## 2016-10-11 ENCOUNTER — Other Ambulatory Visit (HOSPITAL_BASED_OUTPATIENT_CLINIC_OR_DEPARTMENT_OTHER): Payer: Commercial Managed Care - PPO

## 2016-10-11 ENCOUNTER — Encounter: Payer: Self-pay | Admitting: *Deleted

## 2016-10-11 ENCOUNTER — Ambulatory Visit: Payer: Commercial Managed Care - PPO

## 2016-10-11 ENCOUNTER — Encounter: Payer: Self-pay | Admitting: Emergency Medicine

## 2016-10-11 DIAGNOSIS — C4911 Malignant neoplasm of connective and soft tissue of right upper limb, including shoulder: Secondary | ICD-10-CM

## 2016-10-11 DIAGNOSIS — Z5112 Encounter for antineoplastic immunotherapy: Secondary | ICD-10-CM | POA: Diagnosis not present

## 2016-10-11 DIAGNOSIS — Z5111 Encounter for antineoplastic chemotherapy: Secondary | ICD-10-CM

## 2016-10-11 DIAGNOSIS — C50211 Malignant neoplasm of upper-inner quadrant of right female breast: Secondary | ICD-10-CM | POA: Diagnosis not present

## 2016-10-11 DIAGNOSIS — C778 Secondary and unspecified malignant neoplasm of lymph nodes of multiple regions: Secondary | ICD-10-CM

## 2016-10-11 DIAGNOSIS — Z95828 Presence of other vascular implants and grafts: Secondary | ICD-10-CM

## 2016-10-11 LAB — CBC WITH DIFFERENTIAL/PLATELET
BASO%: 0.8 % (ref 0.0–2.0)
Basophils Absolute: 0 10*3/uL (ref 0.0–0.1)
EOS%: 3.3 % (ref 0.0–7.0)
Eosinophils Absolute: 0.2 10*3/uL (ref 0.0–0.5)
HCT: 39.9 % (ref 34.8–46.6)
HGB: 12.6 g/dL (ref 11.6–15.9)
LYMPH%: 28.8 % (ref 14.0–49.7)
MCH: 26.5 pg (ref 25.1–34.0)
MCHC: 31.6 g/dL (ref 31.5–36.0)
MCV: 84 fL (ref 79.5–101.0)
MONO#: 0.6 10*3/uL (ref 0.1–0.9)
MONO%: 10 % (ref 0.0–14.0)
NEUT#: 3.3 10*3/uL (ref 1.5–6.5)
NEUT%: 57.1 % (ref 38.4–76.8)
Platelets: 311 10*3/uL (ref 145–400)
RBC: 4.75 10*6/uL (ref 3.70–5.45)
RDW: 16.9 % — ABNORMAL HIGH (ref 11.2–14.5)
WBC: 5.7 10*3/uL (ref 3.9–10.3)
lymph#: 1.6 10*3/uL (ref 0.9–3.3)

## 2016-10-11 LAB — COMPREHENSIVE METABOLIC PANEL
ALT: 11 U/L (ref 0–55)
AST: 19 U/L (ref 5–34)
Albumin: 3.7 g/dL (ref 3.5–5.0)
Alkaline Phosphatase: 118 U/L (ref 40–150)
Anion Gap: 10 mEq/L (ref 3–11)
BUN: 9.1 mg/dL (ref 7.0–26.0)
CO2: 30 mEq/L — ABNORMAL HIGH (ref 22–29)
Calcium: 9.9 mg/dL (ref 8.4–10.4)
Chloride: 99 mEq/L (ref 98–109)
Creatinine: 0.8 mg/dL (ref 0.6–1.1)
EGFR: 88 mL/min/{1.73_m2} — ABNORMAL LOW (ref >90)
Glucose: 104 mg/dl (ref 70–140)
Potassium: 3.8 mEq/L (ref 3.5–5.1)
Sodium: 139 mEq/L (ref 136–145)
Total Bilirubin: 0.26 mg/dL (ref 0.20–1.20)
Total Protein: 7.9 g/dL (ref 6.4–8.3)

## 2016-10-11 MED ORDER — SODIUM CHLORIDE 0.9 % IV SOLN
Freq: Once | INTRAVENOUS | Status: AC
  Administered 2016-10-11: 13:00:00 via INTRAVENOUS

## 2016-10-11 MED ORDER — PROCHLORPERAZINE MALEATE 10 MG PO TABS
ORAL_TABLET | ORAL | Status: AC
  Filled 2016-10-11: qty 1

## 2016-10-11 MED ORDER — DIPHENHYDRAMINE HCL 25 MG PO CAPS
ORAL_CAPSULE | ORAL | Status: AC
  Filled 2016-10-11: qty 1

## 2016-10-11 MED ORDER — ACETAMINOPHEN 325 MG PO TABS
ORAL_TABLET | ORAL | Status: AC
  Filled 2016-10-11: qty 2

## 2016-10-11 MED ORDER — SODIUM CHLORIDE 0.9 % IJ SOLN
10.0000 mL | INTRAMUSCULAR | Status: DC | PRN
  Administered 2016-10-11: 10 mL via INTRAVENOUS
  Filled 2016-10-11: qty 10

## 2016-10-11 MED ORDER — PACLITAXEL PROTEIN-BOUND CHEMO INJECTION 100 MG
125.0000 mg/m2 | Freq: Once | INTRAVENOUS | Status: AC
  Administered 2016-10-11: 250 mg via INTRAVENOUS
  Filled 2016-10-11: qty 50

## 2016-10-11 MED ORDER — SODIUM CHLORIDE 0.9% FLUSH
10.0000 mL | INTRAVENOUS | Status: DC | PRN
  Administered 2016-10-11: 10 mL
  Filled 2016-10-11: qty 10

## 2016-10-11 MED ORDER — PROCHLORPERAZINE MALEATE 10 MG PO TABS
10.0000 mg | ORAL_TABLET | Freq: Once | ORAL | Status: AC
  Administered 2016-10-11: 10 mg via ORAL

## 2016-10-11 MED ORDER — TRASTUZUMAB CHEMO 150 MG IV SOLR
6.0000 mg/kg | Freq: Once | INTRAVENOUS | Status: AC
  Administered 2016-10-11: 483 mg via INTRAVENOUS
  Filled 2016-10-11: qty 23

## 2016-10-11 MED ORDER — ACETAMINOPHEN 325 MG PO TABS
650.0000 mg | ORAL_TABLET | Freq: Once | ORAL | Status: AC
  Administered 2016-10-11: 650 mg via ORAL

## 2016-10-11 MED ORDER — DIPHENHYDRAMINE HCL 25 MG PO CAPS
25.0000 mg | ORAL_CAPSULE | Freq: Once | ORAL | Status: AC
  Administered 2016-10-11: 25 mg via ORAL

## 2016-10-11 MED ORDER — HEPARIN SOD (PORK) LOCK FLUSH 100 UNIT/ML IV SOLN
500.0000 [IU] | Freq: Once | INTRAVENOUS | Status: AC | PRN
Start: 2016-10-11 — End: 2016-10-11
  Administered 2016-10-11: 500 [IU]
  Filled 2016-10-11: qty 5

## 2016-10-11 NOTE — Patient Instructions (Signed)

## 2016-10-11 NOTE — Progress Notes (Signed)
Patient in office today for labs and chemo; patient requesting refill of her Oxycodone 15mg  IR; [name] aware of patient's request. Per [name] patient was given 120 count of Oxycodone- Acetaminophen 10/325mg  on 09/21/16 prescribed by Dr [name]; Per [name] patient is to see Dr [name] for refills of Oxycodone; and [name] at this point is managing her Methadone.   Patient states she has "about 6 pills left" Then patient states she returned the Oxycodone APAP 10/325 to the pharmacy; then the patient states she's unsure of the pharmacy she filled this prescription at.   Patient very upset stating she was told she could pick up a refill of the Oxy IR today. Patient states " I was lied to".   Repeatedly explained to the patient the directions given per [name]; per patient she states she's not even seeing Dr [name] anymore.   Patient instructed to wait to be called for her infusion.

## 2016-10-11 NOTE — Progress Notes (Signed)
[name]  Patient returned Cancer Care application and documentation to CSW.  CSW completed and submitted application to Cancer Care.   Johnnye Lana, MSW, LCSW, OSW-C Clinical Social Worker [facility] [phone]

## 2016-10-11 NOTE — Patient Instructions (Addendum)
[facility] Discharge Instructions for Patients Receiving Chemotherapy  Today you received the following chemotherapy agents :  Herceptin and Abraxane.  To help prevent nausea and vomiting after your treatment, we encourage you to take your nausea medication as prescribed.  Take Zofran 8 mg by mouth every 12 hours as needed for nausea.  Can also take Compazine 10 mg by mouth every 6 hours as needed for nausea.  DO NOT Drive after taking Compazine as it can cause drowsiness.  DO NOT Eat  Greasy  Nor  Spicy  Foods.  DO Drink  Lots  Of  Fluids  As  Tolerated.   If you develop nausea and vomiting that is not controlled by your nausea medication, call the clinic.   BELOW ARE SYMPTOMS THAT SHOULD BE REPORTED IMMEDIATELY:  *FEVER GREATER THAN 100.5 F  *CHILLS WITH OR WITHOUT FEVER  NAUSEA AND VOMITING THAT IS NOT CONTROLLED WITH YOUR NAUSEA MEDICATION  *UNUSUAL SHORTNESS OF BREATH  *UNUSUAL BRUISING OR BLEEDING  TENDERNESS IN MOUTH AND THROAT WITH OR WITHOUT PRESENCE OF ULCERS  *URINARY PROBLEMS  *BOWEL PROBLEMS  UNUSUAL RASH Items with * indicate a potential emergency and should be followed up as soon as possible.  Feel free to call the clinic you have any questions or concerns. The clinic phone number is [phone].  Please show the CHEMO ALERT CARD at check-in to the Emergency Department and triage nurse.   

## 2016-10-12 ENCOUNTER — Other Ambulatory Visit: Payer: Self-pay | Admitting: *Deleted

## 2016-10-12 ENCOUNTER — Telehealth: Payer: Self-pay | Admitting: *Deleted

## 2016-10-12 NOTE — Telephone Encounter (Signed)
This RN returned call to pt per message left stating " urgent ". Obtained identified VM that stated " mailbox is full ".  Note this RN verified with MD status of narcotic prescriptions being filled at this office.  This note is to summarize current situation.  Per MD visit 11/30 [name] is dispensing methadone and pt is receiving narcotics per Dr [name].  Last fill of narcotics per Dr [name] was percocet on 09/21/2016 with dispense amount of 120 tabs with instructions to take 2 tabs q 6 hours which would be a 30 day supply.  Note per previous entry 11/20  that [facility] felt the percocet was causing a reaction in her arm and had requested oxy IR from this office.  She was told she would have to surrender the percocet to obtain the oxy IR.  [name] took her bottle of percocet to the pharmacy which only contained 18 tablets - which did not equal appropriate amount per dispense instructions.  Pharmacy would not fill the Oxy IR and returned bottle of percocet with tablets to pt ( note have written documentation per pharmacy ).  Pt came in to this office at that time and this RN inquired per above [name] stated " I took more then I should "  This RN reiterated to pt concern for opioid and tylenol over use which could be life threatening.   Per MD prescription for additional narcotics can be be given at this time due to pt obtaining medication from other MD.  Per [name] opioids will not be dispensed per this office due to ongoing issues with pt's compliance. Note pt's pain needs have attempted to be managed with multiple medication with pt stating intolerance and continuing to request opioids.  Per this office pt is on methadone and this can be titrated for best pain control.

## 2016-10-13 ENCOUNTER — Telehealth: Payer: Self-pay | Admitting: *Deleted

## 2016-10-13 ENCOUNTER — Other Ambulatory Visit: Payer: Self-pay | Admitting: *Deleted

## 2016-10-13 MED ORDER — SERTRALINE HCL 50 MG PO TABS: 50.0000 mg | ORAL_TABLET | Freq: Every day | ORAL | 1 refills | Status: DC

## 2016-10-13 NOTE — Telephone Encounter (Signed)
This RN spoke with [name] yesterday per her call stating she thought [name] said he would give her the Oxy IR this week.  This RN reiterated with pt

## 2016-10-14 ENCOUNTER — Ambulatory Visit: Payer: Commercial Managed Care - PPO | Admitting: Surgery

## 2016-10-19 ENCOUNTER — Ambulatory Visit: Payer: Commercial Managed Care - PPO | Admitting: Internal Medicine

## 2016-10-19 ENCOUNTER — Other Ambulatory Visit: Payer: Self-pay | Admitting: *Deleted

## 2016-10-19 DIAGNOSIS — C50211 Malignant neoplasm of upper-inner quadrant of right female breast: Secondary | ICD-10-CM

## 2016-10-19 MED ORDER — OXYCODONE HCL 15 MG PO TABS: 30.0000 mg | ORAL_TABLET | ORAL | 0 refills | Status: DC | PRN

## 2016-10-19 MED ORDER — OXYMORPHONE HCL 10 MG PO TABS: 10.0000 mg | ORAL_TABLET | ORAL | 0 refills | Status: DC | PRN

## 2016-10-19 MED ORDER — OXYMORPHONE HCL ER 10 MG PO T12A: 10.0000 mg | EXTENDED_RELEASE_TABLET | Freq: Three times a day (TID) | ORAL | 0 refills | Status: DC

## 2016-10-20 ENCOUNTER — Encounter: Payer: Commercial Managed Care - PPO | Attending: Surgery | Admitting: Surgery

## 2016-10-20 DIAGNOSIS — Z853 Personal history of malignant neoplasm of breast: Secondary | ICD-10-CM | POA: Diagnosis not present

## 2016-10-20 DIAGNOSIS — I972 Postmastectomy lymphedema syndrome: Secondary | ICD-10-CM | POA: Diagnosis not present

## 2016-10-20 DIAGNOSIS — C4911 Malignant neoplasm of connective and soft tissue of right upper limb, including shoulder: Secondary | ICD-10-CM | POA: Insufficient documentation

## 2016-10-20 DIAGNOSIS — S41101A Unspecified open wound of right upper arm, initial encounter: Secondary | ICD-10-CM | POA: Insufficient documentation

## 2016-10-20 DIAGNOSIS — X58XXXA Exposure to other specified factors, initial encounter: Secondary | ICD-10-CM | POA: Diagnosis not present

## 2016-10-20 DIAGNOSIS — S2190XA Unspecified open wound of unspecified part of thorax, initial encounter: Secondary | ICD-10-CM | POA: Diagnosis not present

## 2016-10-21 NOTE — Progress Notes (Signed)
[name], [name] ([id number]) Visit Report for 10/20/2016 Chief Complaint Document Details Patient Name: [name], [name] 10/20/2016 3:00 Date of Service: PM Medical Record [id number] Number: Patient Account Number: [id number] February 11, 1956 (60 y.o. Treating RN: [name] Date of Birth/Sex: Female) Other Clinician: Primary Care Physician: [name] Treating [name] Referring Physician: Ricke [name] Physician/Extender: [name] in Treatment: 16 Information Obtained from: Patient Chief Complaint Patient presents to the wound care center for a consult due non healing wound. The patient was known to have bilateral lymphedema as a status post mastectomy syndrome now comes with an ulcerated area of her right upper arm and back which she's had for several weeks. Electronic Signature(s) Signed: 10/20/2016 3:12:55 PM By: [name] Fudge MD, FACS Entered By: [name] on 10/20/2016 15:12:55 [name] ([id number]) -------------------------------------------------------------------------------- HPI Details Patient Name: [name], [name] 10/20/2016 3:00 Date of Service: PM Medical Record [id number] Number: Patient Account Number: [id number] [birth year] (60 y.o. Treating RN: [name] Date of Birth/Sex: Female) Other Clinician: Primary Care Physician: [name] Treating [name] Referring Physician: Ricke [name] Physician/Extender: Weeks in Treatment: 16 History of Present Illness Location: bilateral arm lymphedema with now skin ulceration of the right upper extremity. Quality: Patient reports experiencing a dull pain to affected area(s). Severity: Patient states wound are getting worse. Duration: Patient has had the wound for > 18 months prior to seeking treatment at the wound center Timing: Pain in wound is Intermittent (comes and goes Context: The wound occurred when the patiento. Modifying Factors: Consults to this date include: local care and  antibiotics as prescribed by the PCP and the ER. Associated Signs and Symptoms: Patient reports having increase discharge. HPI Description: 60 year old patient known to [name] from a prior visit about a year ago, returns for care. she is known to have stage IV breast cancer, angiosarcoma and has been on treatment with the medical oncologist Dr. Jana [name]. She has been seeing the hospice nurses earlier and has been continuing with chemotherapy. She was known to have stage IV breast cancer, originally diagnosed in April 2003 and now manifested chiefly by loco-regional nodal disease (neck, chest), without lung, liver, bone, or brain involvement. he has received extensive radiation and chemotherapy after the surgeries. She's always had bilateral lymphedema of her arms but now comes with an ulcerated area on her right upper arm posteriorly. Her medical Oncologist, Dr.Gustav [name], was concerned that what we may be seeing in [name]'s right upper extremity is actually skin spread of her cancer. This needs to be biopsied and she was sending her back to general surgery with that in mind. She placed a referral to the wound clinic . She has been on doxycycline for now. The general surgeon saw her 2 days ago and did a biopsy and this report is pending. In April 2016 she had a right upper arm venous duplex study which showed: No evidence of deep vein or superficial thrombosis involving the ooright upper extremity and left subclavian vein. Moderate edema noted throughout the right upper extremity. 05/01/2015 the patient tells me that her surgeon revealed that her biopsy was negative and that there was no pathology found. Other than that she is doing well and she thinks her edema has gone down a bit. I had [name] a biopsy for her about a year ago and final report is not back yet but the pathologist Dr. Chrystine [name] did speak to me on the phone a few days ago and mentioned that he is doing final stains and there may  be a mixture of a  angiosarcoma and metastatic breast cancer. I have not been able to speak to Dr. Jana [name] yet but I understand the patient has seen him today and he has discussed the provisional pathology and offered her some chemotherapy. The final pathology report is back and it shows a metastatic breast carcinoma and an atypical vascular proliferation. 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 She informs me that she is going to [facility] to the [place] for further care. [name], [name] ([id number]) 08/19/16: pt returns today for f/u. she attended the cancer center in [place]. The right arm wound has healed and significant reduction in wound size regarding he right back. She denies systemic s/s of infection. Electronic Signature(s) Signed: 10/20/2016 3:13:00 PM By: [name] Fudge MD, FACS Entered By: [name] on 10/20/2016 15:13:00 [name] ([id number]) -------------------------------------------------------------------------------- Physical Exam Details Patient Name: [name] 10/20/2016 3:00 Date of Service: PM Medical Record [id number] Number: Patient Account Number: [id number] 1955-12-03 (60 y.o. Treating RN: [name] Date of Birth/Sex: Female) Other Clinician: Primary Care Physician: [name] Treating [name] Referring Physician: Ricke [name] Physician/Extender: Weeks in Treatment: 16 Constitutional . Pulse regular. Respirations normal and unlabored. Afebrile. . Eyes Nonicteric. Reactive to light. Ears, Nose, Mouth, and Throat Lips, teeth, and gums WNL.Marland Kitchen Moist mucosa without lesions. Neck supple and nontender. No palpable supraclavicular or cervical adenopathy. Normal sized without goiter. Respiratory WNL. No retractions.. Cardiovascular Pedal Pulses WNL. No clubbing, cyanosis or edema. Lymphatic No adneopathy. No adenopathy. No adenopathy. Musculoskeletal Adexa without tenderness or enlargement.. Digits  and nails w/o clubbing, cyanosis, infection, petechiae, ischemia, or inflammatory conditions.. Integumentary (Hair, Skin) No suspicious lesions. No crepitus or fluctuance. No peri-wound warmth or erythema. No masses.Marland Kitchen Psychiatric Judgement and insight Intact.. No evidence of depression, anxiety, or agitation.. Notes the wounds of all completely healed though she has significant lymphedema and several nodules over her arm and posterior upper back. Electronic Signature(s) Signed: 10/20/2016 3:14:02 PM By: [name] Fudge MD, FACS Entered By: [name] on 10/20/2016 15:14:01 [name] ([id number]) -------------------------------------------------------------------------------- Physician Orders Details Patient Name: [name], [name] 10/20/2016 3:00 Date of Service: PM Medical Record [id number] Number: Patient Account Number: [id number] 01-Nov-1956 (60 y.o. Treating RN: [name] Date of Birth/Sex: Female) Other Clinician: Primary Care Physician: [name] Treating Beatrice Ziehm Referring Physician: Ricke [name] Physician/Extender: [name] in Treatment: 16 Verbal / Phone Orders: Yes Clinician: Carolyne Fiscal, Debi Read Back and Verified: Yes Diagnosis Coding Discharge From [facility] o Discharge from [place] - Please call our office if you have any questions or concerns. Electronic Signature(s) Signed: 10/20/2016 3:42:21 PM By: [name] Fudge MD, FACS Signed: 10/20/2016 4:30:33 PM By: Alric Quan Entered By: Alric Quan on 10/20/2016 15:05:01 [name] ([id number]) -------------------------------------------------------------------------------- Problem List Details Patient Name: [name], [name] 10/20/2016 3:00 Date of Service: PM Medical Record [id number] Number: Patient Account Number: [id number] 03/28/1956 (60 y.o. Treating RN: [name] Date of Birth/Sex: Female) Other Clinician: Primary Care Physician: [name]  Treating Deaysia Grigoryan Referring Physician: Ricke [name] Physician/Extender: [name] in Treatment: 16 Active Problems ICD-10 Encounter Code Description Active Date Diagnosis Z85.3 Personal history of malignant neoplasm of breast 06/30/2016 Yes C49.11 Malignant neoplasm of connective and soft tissue of right 06/30/2016 Yes upper limb, including shoulder S41.101A Unspecified open wound of right upper arm, initial 06/30/2016 Yes encounter S21.90XA Unspecified open wound of unspecified part of thorax, 06/30/2016 Yes initial encounter I97.2 Postmastectomy lymphedema syndrome 06/30/2016 Yes Inactive Problems Resolved Problems Electronic Signature(s) Signed: 10/20/2016 3:12:24 PM By: [name] Fudge MD, FACS Entered By: [name]  on 10/20/2016 15:12:24 [name], [name] ([id number]) -------------------------------------------------------------------------------- Progress Note Details Patient Name: [name], [name] 10/20/2016 3:00 Date of Service: PM Medical Record [id number] Number: Patient Account Number: [id number] [birth year] (60 y.o. Treating RN: [name] Date of Birth/Sex: Female) Other Clinician: Primary Care Physician: [name] Treating Stephie Xu Referring Physician: Ricke [name] Physician/Extender: [name] in Treatment: 16 Subjective Chief Complaint Information obtained from Patient Patient presents to the wound care center for a consult due non healing wound. The patient was known to have bilateral lymphedema as a status post mastectomy syndrome now comes with an ulcerated area of her right upper arm and back which she's had for several weeks. History of Present Illness (HPI) The following HPI elements were documented for the patient's wound: Location: bilateral arm lymphedema with now skin ulceration of the right upper extremity. Quality: Patient reports experiencing a dull pain to affected area(s). Severity: Patient states wound are getting  worse. Duration: Patient has had the wound for > 18 months prior to seeking treatment at the wound center Timing: Pain in wound is Intermittent (comes and goes Context: The wound occurred when the patient . Modifying Factors: Consults to this date include: local care and antibiotics as prescribed by the PCP and the ER. Associated Signs and Symptoms: Patient reports having increase discharge. 60 year old patient known to [name] from a prior visit about a year ago, returns for care. she is known to have stage IV breast cancer, angiosarcoma and has been on treatment with the medical oncologist Dr. Jana [name]. She has been seeing the hospice nurses earlier and has been continuing with chemotherapy. She was known to have stage IV breast cancer, originally diagnosed in April 2003 and now manifested chiefly by loco-regional nodal disease (neck, chest), without lung, liver, bone, or brain involvement. he has received extensive radiation and chemotherapy after the surgeries. She's always had bilateral lymphedema of her arms but now comes with an ulcerated area on her right upper arm posteriorly. Her medical Oncologist, Dr.Gustav [name], was concerned that what we may be seeing in Nary's right upper extremity is actually skin spread of her cancer. This needs to be biopsied and she was sending her back to general surgery with that in mind. She placed a referral to the wound clinic . She has been on doxycycline for now. The general surgeon saw her 2 days ago and did a biopsy and this report is pending. In April 2016 she had a right upper arm venous duplex study which showed: No evidence of deep vein or superficial thrombosis involving the right upper extremity and left subclavian vein. Moderate edema noted throughout the right upper extremity. 05/01/2015 the patient tells me that her surgeon revealed that her biopsy was negative and that there was no pathology found. Other than that she is doing well and  she thinks her edema has gone down a bit. [name], [name] ([id number]) I had [name] a biopsy for her about a year ago and final report is not back yet but the pathologist Dr. Chrystine [name] did speak to me on the phone a few days ago and mentioned that he is doing final stains and there may be a mixture of a angiosarcoma and metastatic breast cancer. I have not been able to speak to Dr. Jana [name] yet but I understand the patient has seen him today and he has discussed the provisional pathology and offered her some chemotherapy. The final pathology report is back and it shows a metastatic breast carcinoma and an atypical vascular proliferation. These findings are suggestive  of an angiosarcoma. She informs me that she is going to [facility] to the [place] for further care. 08/19/16: pt returns today for f/u. she attended the cancer center in [place]. The right arm wound has healed and significant reduction in wound size regarding he right back. She denies systemic s/s of infection. Objective Constitutional Pulse regular. Respirations normal and unlabored. Afebrile. Vitals Time Taken: 2:49 PM, Height: 65 in, Weight: 159.2 lbs, BMI: 26.5, Temperature: 98.2 F, Pulse: 75 bpm, Respiratory Rate: 18 breaths/min, Blood Pressure: 123/78 mmHg. Eyes Nonicteric. Reactive to light. Ears, Nose, Mouth, and Throat Lips, teeth, and gums WNL.Marland Kitchen Moist mucosa without lesions. Neck supple and nontender. No palpable supraclavicular or cervical adenopathy. Normal sized without goiter. Respiratory WNL. No retractions.. Cardiovascular Pedal Pulses WNL. No clubbing, cyanosis or edema. Lymphatic No adneopathy. No adenopathy. No adenopathy. Musculoskeletal Adexa without tenderness or enlargement.. Digits and nails w/o clubbing, cyanosis, infection, petechiae, ischemia, or inflammatory conditions.Marland Kitchen Psychiatric Judgement and insight Intact.. No evidence of depression, anxiety, or agitation.[name], [name]  ([id number]) General Notes: the wounds of all completely healed though she has significant lymphedema and several nodules over her arm and posterior upper back. Integumentary (Hair, Skin) No suspicious lesions. No crepitus or fluctuance. No peri-wound warmth or erythema. No masses.. Wound #3 status is Open. Original cause of wound was Gradually Appeared. The wound is located on the Right,Proximal Back. The wound measures 0cm length x 0cm width x 0cm depth; 0cm^2 area and 0cm^3 volume. The wound is limited to skin breakdown. There is no tunneling or undermining noted. There is a none present amount of drainage noted. The wound margin is distinct with the outline attached to the wound base. There is no granulation within the wound bed. There is no necrotic tissue within the wound bed. The periwound skin appearance did not exhibit: Callus, Crepitus, Excoriation, Fluctuance, Friable, Induration, Localized Edema, Rash, Scarring, Dry/Scaly, Maceration, Moist, Atrophie Blanche, Cyanosis, Ecchymosis, Hemosiderin Staining, Mottled, Pallor, Rubor, Erythema. Periwound temperature was noted as No Abnormality. The periwound has tenderness on palpation. Assessment Active Problems ICD-10 Z85.3 - Personal history of malignant neoplasm of breast C49.11 - Malignant neoplasm of connective and soft tissue of right upper limb, including shoulder S41.101A - Unspecified open wound of right upper arm, initial encounter S21.90XA - Unspecified open wound of unspecified part of thorax, initial encounter I97.2 - Postmastectomy lymphedema syndrome Plan Discharge From [facility]: Discharge from [place] - Please call our office if you have any questions or concerns. The wounds have all healed and I have asked her to continue with symptomatic treatment and her medical oncologist regular visits. She does not need to return to the wound center unless needed and she is discharged from our services ALIDIA, BARDER  ([id number]) Electronic Signature(s) Signed: 10/20/2016 3:14:43 PM By: [name] Fudge MD, FACS Entered By: [name] on 10/20/2016 15:14:42 [name] ([id number]) -------------------------------------------------------------------------------- SuperBill Details Patient Name: [name] Date of Service: 10/20/2016 Medical Record Number: [id number] Patient Account Number: [id number] Date of Birth/Sex: 08-Mar-1956 (60 y.o. Female) Treating RN: [name] Primary Care Physician: [name] Other Clinician: Referring Physician: Ricke [name] Treating Physician/Extender: Frann Rider in Treatment: 16 Diagnosis Coding ICD-10 Codes Code Description Z85.3 Personal history of malignant neoplasm of breast C49.11 Malignant neoplasm of connective and soft tissue of right upper limb, including shoulder S41.101A Unspecified open wound of right upper arm, initial encounter S21.90XA Unspecified open wound of unspecified part of thorax, initial encounter I97.2 Postmastectomy lymphedema syndrome Facility Procedures CPT4 Code:  [id number] Description: [id number] - WOUND CARE VISIT-LEV 2 EST PT Modifier: Quantity: 1 Physician Procedures CPT4 Code Description: [id number] - WC PHYS LEVEL 2 - EST PT ICD-10 Description Diagnosis Z85.3 Personal history of malignant neoplasm of breast S41.101A Unspecified open wound of right upper arm, initial S21.90XA Unspecified open wound of unspecified  part of thor Modifier: encounter ax, initial en Quantity: 1 counter Electronic Signature(s) Signed: 10/20/2016 3:42:21 PM By: [name] Fudge MD, FACS Signed: 10/20/2016 4:30:33 PM By: Alric Quan Previous Signature: 10/20/2016 3:15:01 PM Version By: [name] Fudge MD, FACS Entered By: Alric Quan on 10/20/2016 15:32:03

## 2016-10-21 NOTE — Progress Notes (Signed)
[name] ([id number]) Visit Report for 10/20/2016 Arrival Information Details Patient Name: [name] Date of Service: 10/20/2016 3:00 PM Medical Record Number: [id number] Patient Account Number: [id number] Date of Birth/Sex: 1956-05-15 (60 y.o. Female) Treating RN: Carolyne Fiscal, Debi Primary Care Physician: [name] Other Clinician: Referring Physician: [name] Treating Physician/Extender: Frann Rider in Treatment: 58 Visit Information History Since Last Visit All ordered tests and consults were completed: No Patient Arrived: Ambulatory Added or deleted any medications: No Arrival Time: 14:48 Any new allergies or adverse reactions: No Accompanied By: self Had a fall or experienced change in No Transfer Assistance: None activities of daily living that may affect Patient Identification Verified: Yes risk of falls: Secondary Verification Process Yes Signs or symptoms of abuse/neglect since last No Completed: visito Patient Requires Transmission- No Hospitalized since last visit: No Based Precautions: Pain Present Now: No Patient Has Alerts: Yes Patient Alerts: NO BP in RIGHT ARM Electronic Signature(s) Signed: 10/20/2016 4:30:33 PM By: Alric Quan Entered By: Alric Quan on 10/20/2016 14:48:32 [name] ([id number]) -------------------------------------------------------------------------------- Clinic Level of Care Assessment Details Patient Name: [name] Date of Service: 10/20/2016 3:00 PM Medical Record Number: [id number] Patient Account Number: [id number] Date of Birth/Sex: Oct 31, 1956 (60 y.o. Female) Treating RN: Carolyne Fiscal, Debi Primary Care Physician: [name] Other Clinician: Referring Physician: [name] Treating Physician/Extender: Frann Rider in Treatment: 16 Clinic Level of Care Assessment Items TOOL 4 Quantity Score X - Use when only an EandM is performed on FOLLOW-UP visit 1  0 ASSESSMENTS - Nursing Assessment / Reassessment X - Reassessment of Co-morbidities (includes updates in patient status) 1 10 X - Reassessment of Adherence to Treatment Plan 1 5 ASSESSMENTS - Wound and Skin Assessment / Reassessment []  - Simple Wound Assessment / Reassessment - one wound 0 []  - Complex Wound Assessment / Reassessment - multiple wounds 0 []  - Dermatologic / Skin Assessment (not related to wound area) 0 ASSESSMENTS - Focused Assessment []  - Circumferential Edema Measurements - multi extremities 0 []  - Nutritional Assessment / Counseling / Intervention 0 []  - Lower Extremity Assessment (monofilament, tuning fork, pulses) 0 []  - Peripheral Arterial Disease Assessment (using hand held doppler) 0 ASSESSMENTS - Ostomy and/or Continence Assessment and Care []  - Incontinence Assessment and Management 0 []  - Ostomy Care Assessment and Management (repouching, etc.) 0 PROCESS - Coordination of Care X - Simple Patient / Family Education for ongoing care 1 15 []  - Complex (extensive) Patient / Family Education for ongoing care 0 []  - Staff obtains Programmer, systems, Records, Test Results / Process Orders 0 []  - Staff telephones HHA, Nursing Homes / Clarify orders / etc 0 []  - Routine Transfer to another Facility (non-emergent condition) 0 [name] ([id number]) []  - Routine Hospital Admission (non-emergent condition) 0 []  - New Admissions / Biomedical engineer / Ordering NPWT, Apligraf, etc. 0 []  - Emergency Hospital Admission (emergent condition) 0 X - Simple Discharge Coordination 1 10 []  - Complex (extensive) Discharge Coordination 0 PROCESS - Special Needs []  - Pediatric / Minor Patient Management 0 []  - Isolation Patient Management 0 []  - Hearing / Language / Visual special needs 0 []  - Assessment of Community assistance (transportation, D/C planning, etc.) 0 []  - Additional assistance / Altered mentation 0 []  - Support Surface(s) Assessment (bed, cushion, seat, etc.)  0 INTERVENTIONS - Wound Cleansing / Measurement []  - Simple Wound Cleansing - one wound 0 []  - Complex Wound Cleansing - multiple wounds 0 X - Wound Imaging (photographs - any number of wounds) 1 5 []  -  Wound Tracing (instead of photographs) 0 []  - Simple Wound Measurement - one wound 0 []  - Complex Wound Measurement - multiple wounds 0 INTERVENTIONS - Wound Dressings []  - Small Wound Dressing one or multiple wounds 0 []  - Medium Wound Dressing one or multiple wounds 0 []  - Large Wound Dressing one or multiple wounds 0 []  - Application of Medications - topical 0 []  - Application of Medications - injection 0 INTERVENTIONS - Miscellaneous []  - External ear exam 0 [name] ([id number]) []  - Specimen Collection (cultures, biopsies, blood, body fluids, etc.) 0 []  - Specimen(s) / Culture(s) sent or taken to Lab for analysis 0 []  - Patient Transfer (multiple staff / Harrel Lemon Lift / Similar devices) 0 []  - Simple Staple / Suture removal (25 or less) 0 []  - Complex Staple / Suture removal (26 or more) 0 []  - Hypo / Hyperglycemic Management (close monitor of Blood Glucose) 0 []  - Ankle / Brachial Index (ABI) - do not check if billed separately 0 X - Vital Signs 1 5 Has the patient been seen at the hospital within the last three years: Yes Total Score: 50 Level Of Care: New/Established - Level 2 Electronic Signature(s) Signed: 10/20/2016 4:30:33 PM By: Alric Quan Entered By: Alric Quan on 10/20/2016 15:31:51 [name] ([id number]) -------------------------------------------------------------------------------- Encounter Discharge Information Details Patient Name: [name] Date of Service: 10/20/2016 3:00 PM Medical Record Number: [id number] Patient Account Number: [id number] Date of Birth/Sex: [birth year] (60 y.o. Female) Treating RN: Carolyne Fiscal, Debi Primary Care Physician: [name] Other Clinician: Referring Physician: [name] Treating  Physician/Extender: Frann Rider in Treatment: 43 Encounter Discharge Information Items Discharge Pain Level: 0 Discharge Condition: Stable Ambulatory Status: Ambulatory Discharge Destination: Home Transportation: Private Auto Accompanied By: self Schedule Follow-up Appointment: No Medication Reconciliation completed and provided to Patient/Care Yes Teller [place]: Provided on Clinical Summary of Care: 10/20/2016 Form Type Recipient Paper Patient MF Electronic Signature(s) Signed: 10/20/2016 3:11:46 PM By: Ruthine Dose Entered By: Ruthine Dose on 10/20/2016 15:11:46 [name] ([id number]) -------------------------------------------------------------------------------- Lower Extremity Assessment Details Patient Name: [name] Date of Service: 10/20/2016 3:00 PM Medical Record Number: [id number] Patient Account Number: [id number] Date of Birth/Sex: 29-Oct-1956 (60 y.o. Female) Treating RN: Ahmed Prima Primary Care Physician: [name] Other Clinician: Referring Physician: [name] Treating Physician/Extender: Frann Rider in Treatment: 16 Electronic Signature(s) Signed: 10/20/2016 4:30:33 PM By: Alric Quan Entered By: Alric Quan on 10/20/2016 14:54:29 [name] ([id number]) -------------------------------------------------------------------------------- Multi Wound Chart Details Patient Name: [name] Date of Service: 10/20/2016 3:00 PM Medical Record Number: [id number] Patient Account Number: [id number] Date of Birth/Sex: 1956/01/23 (60 y.o. Female) Treating RN: Ahmed Prima Primary Care Physician: [name] Other Clinician: Referring Physician: [name] Treating Physician/Extender: Frann Rider in Treatment: 16 Vital Signs Height(in): 65 Pulse(bpm): 75 Weight(lbs): 159.2 Blood Pressure 123/78 (mmHg): Body Mass Index(BMI): 26 Temperature(F): 98.2 Respiratory  Rate 18 (breaths/min): Photos: [N/A:N/A] Wound Location: Right Back - Proximal N/A N/A Wounding Event: Gradually Appeared N/A N/A Primary Etiology: Lymphedema N/A N/A Comorbid History: Deep Vein Thrombosis, N/A N/A Hypertension, Received Chemotherapy, Received Radiation Date Acquired: 03/30/2016 N/A N/A Weeks of Treatment: 16 N/A N/A Wound Status: Open N/A N/A Measurements L x W x D 0x0x0 N/A N/A (cm) Area (cm) : 0 N/A N/A Volume (cm) : 0 N/A N/A % Reduction in Area: 100.00% N/A N/A % Reduction in Volume: 100.00% N/A N/A Classification: Partial Thickness N/A N/A Exudate Amount: None Present N/A N/A Wound Margin: Distinct, outline attached N/A N/A Granulation Amount: None Present (0%) N/A  N/A Necrotic Amount: None Present (0%) N/A N/A Exposed Structures: Fascia: No N/A N/A Fat: No Varano, Contrina ([id number]) Tendon: No Muscle: No Joint: No Bone: No Limited to Skin Breakdown Epithelialization: Large (67-100%) N/A N/A Periwound Skin Texture: Edema: No N/A N/A Excoriation: No Induration: No Callus: No Crepitus: No Fluctuance: No Friable: No Rash: No Scarring: No Periwound Skin Maceration: No N/A N/A Moisture: Moist: No Dry/Scaly: No Periwound Skin Color: Atrophie Blanche: No N/A N/A Cyanosis: No Ecchymosis: No Erythema: No Hemosiderin Staining: No Mottled: No Pallor: No Rubor: No Temperature: No Abnormality N/A N/A Tenderness on Yes N/A N/A Palpation: Wound Preparation: Ulcer Cleansing: N/A N/A Rinsed/Irrigated with Saline Topical Anesthetic Applied: None Treatment Notes Electronic Signature(s) Signed: 10/20/2016 4:30:33 PM By: Alric Quan Entered By: Alric Quan on 10/20/2016 15:04:20 [name] ([id number]) -------------------------------------------------------------------------------- Multi-Disciplinary Care Plan Details Patient Name: [name] Date of Service: 10/20/2016 3:00 PM Medical Record Number: [id number] Patient  Account Number: [id number] Date of Birth/Sex: Nov 24, 1955 (60 y.o. Female) Treating RN: Ahmed Prima Primary Care Physician: [name] Other Clinician: Referring Physician: [name] Treating Physician/Extender: Frann Rider in Treatment: 16 Active Inactive Electronic Signature(s) Signed: 10/20/2016 4:30:33 PM By: Alric Quan Entered By: Alric Quan on 10/20/2016 15:04:11 [name] ([id number]) -------------------------------------------------------------------------------- Pain Assessment Details Patient Name: [name] Date of Service: 10/20/2016 3:00 PM Medical Record Number: [id number] Patient Account Number: [id number] Date of Birth/Sex: 04/02/1956 (60 y.o. Female) Treating RN: Ahmed Prima Primary Care Physician: [name] Other Clinician: Referring Physician: [name] Treating Physician/Extender: Frann Rider in Treatment: 16 Active Problems Location of Pain Severity and Description of Pain Patient Has Paino No Site Locations With Dressing Change: No Pain Management and Medication Current Pain Management: Electronic Signature(s) Signed: 10/20/2016 4:30:33 PM By: Alric Quan Entered By: Alric Quan on 10/20/2016 14:49:12 [name] ([id number]) -------------------------------------------------------------------------------- Patient/Caregiver Education Details Patient Name: [name] Date of Service: 10/20/2016 3:00 PM Medical Record Number: [id number] Patient Account Number: [id number] Date of Birth/Gender: 1956-02-16 (60 y.o. Female) Treating RN: Ahmed Prima Primary Care Physician: [name] Other Clinician: Referring Physician: [name] Treating Physician/Extender: Frann Rider in Treatment: 16 Education Assessment Education Provided To: Patient Education Topics Provided Wound/Skin Impairment: Handouts: Other: Please call our office if you have any  questions or concerns. Methods: Explain/Verbal Responses: State content correctly Electronic Signature(s) Signed: 10/20/2016 4:30:33 PM By: Alric Quan Entered By: Alric Quan on 10/20/2016 15:05:55 [name] ([id number]) -------------------------------------------------------------------------------- Wound Assessment Details Patient Name: [name] Date of Service: 10/20/2016 3:00 PM Medical Record Number: [id number] Patient Account Number: [id number] Date of Birth/Sex: [birth year] (60 y.o. Female) Treating RN: Carolyne Fiscal, Debi Primary Care Physician: [name] Other Clinician: Referring Physician: [name] Treating Physician/Extender: Frann Rider in Treatment: 16 Wound Status Wound Number: 3 Primary Lymphedema Etiology: Wound Location: Right Back - Proximal Wound Open Wounding Event: Gradually Appeared Status: Date Acquired: 03/30/2016 Comorbid Deep Vein Thrombosis, Hypertension, Weeks Of Treatment: 16 History: Received Chemotherapy, Received Clustered Wound: No Radiation Photos Photo Uploaded By: Alric Quan on 10/20/2016 14:59:57 Wound Measurements Length: (cm) Width: (cm) Depth: (cm) Area: (cm) Volume: (cm) 0 % Reduction in Area: 100% 0 % Reduction in Volume: 100% 0 Epithelialization: Large (67-100%) 0 Tunneling: No 0 Undermining: No Wound Description Classification: Partial Thickness Wound Margin: Distinct, outline attached Exudate Amount: None Present Foul Odor After Cleansing: No Wound Bed Granulation Amount: None Present (0%) Exposed Structure Necrotic Amount: None Present (0%) Fascia Exposed: No Fat Layer Exposed: No Tendon Exposed: No Muscle Exposed: No Joint Exposed: No [name], [name] (  [id number]) Bone Exposed: No Limited to Skin Breakdown Periwound Skin Texture Texture Color No Abnormalities Noted: No No Abnormalities Noted: No Callus: No Atrophie Blanche: No Crepitus: No Cyanosis:  No Excoriation: No Ecchymosis: No Fluctuance: No Erythema: No Friable: No Hemosiderin Staining: No Induration: No Mottled: No Localized Edema: No Pallor: No Rash: No Rubor: No Scarring: No Temperature / Pain Moisture Temperature: No Abnormality No Abnormalities Noted: No Tenderness on Palpation: Yes Dry / Scaly: No Maceration: No Moist: No Wound Preparation Ulcer Cleansing: Rinsed/Irrigated with Saline Topical Anesthetic Applied: None Electronic Signature(s) Signed: 10/20/2016 4:30:33 PM By: Alric Quan Entered By: Alric Quan on 10/20/2016 14:59:22 [name] ([id number]) -------------------------------------------------------------------------------- Vitals Details Patient Name: [name] Date of Service: 10/20/2016 3:00 PM Medical Record Number: [id number] Patient Account Number: [id number] Date of Birth/Sex: 09-17-1956 (60 y.o. Female) Treating RN: Carolyne Fiscal, Debi Primary Care Physician: [name] Other Clinician: Referring Physician: [name] Treating Physician/Extender: Frann Rider in Treatment: 16 Vital Signs Time Taken: 14:49 Temperature (F): 98.2 Height (in): 65 Pulse (bpm): 75 Weight (lbs): 159.2 Respiratory Rate (breaths/min): 18 Body Mass Index (BMI): 26.5 Blood Pressure (mmHg): 123/78 Reference Range: 80 - 120 mg / dl Electronic Signature(s) Signed: 10/20/2016 4:30:33 PM By: Alric Quan Entered By: Alric Quan on 10/20/2016 14:54:24

## 2016-10-21 NOTE — Telephone Encounter (Signed)
No entry 

## 2016-11-01 ENCOUNTER — Other Ambulatory Visit: Payer: Commercial Managed Care - PPO

## 2016-11-01 ENCOUNTER — Ambulatory Visit: Payer: Commercial Managed Care - PPO

## 2016-11-03 ENCOUNTER — Other Ambulatory Visit: Payer: Commercial Managed Care - PPO

## 2016-11-03 ENCOUNTER — Telehealth: Payer: Self-pay | Admitting: *Deleted

## 2016-11-03 ENCOUNTER — Ambulatory Visit: Payer: Commercial Managed Care - PPO

## 2016-11-03 NOTE — Telephone Encounter (Signed)
This RN was contacted by Archie Endo NP with Palliative Care stating per visit yesterday pt states she " over used my pain medication due to the holidays and having to go out to do errands with my daughter and now am about out of them ".  This RN discussed above with Rodena Piety including that patient has known history of narcotic abuse and recent signed pain contract per this office.  Rodena Piety states pt " was very honest " - stating patient is aware of contract including " they won't prescribe me anymore right now "  Plan at present is above will be reviewed with MD upon his return to the office per any other recommendations.

## 2016-11-08 ENCOUNTER — Other Ambulatory Visit: Payer: Self-pay | Admitting: *Deleted

## 2016-11-08 ENCOUNTER — Telehealth: Payer: Self-pay | Admitting: *Deleted

## 2016-11-08 MED ORDER — METHADONE HCL 5 MG PO TABS: 5.0000 mg | ORAL_TABLET | Freq: Three times a day (TID) | ORAL | 0 refills | Status: DC

## 2016-11-08 MED ORDER — METHADONE HCL 10 MG PO TABS: 10.0000 mg | ORAL_TABLET | Freq: Three times a day (TID) | ORAL | 0 refills | Status: DC

## 2016-11-08 NOTE — Telephone Encounter (Signed)
Pt's daughter, [name], called states pt is out of her pain medications and need new Rxs today for her Opana 10 mg and Oxycodone 15 mg.   Please call daughter back at [phone], when Rxs are ready to pick up.

## 2016-11-08 NOTE — Telephone Encounter (Signed)
This RN returned call to pt and spoke directly with her regarding request for medication refill for Opana and Oxy IR-  Pt states she " had to use more during the holidays because I was doing more and needed it for my pain "  This RN discussed with pt issues with above -   1. Per signed agreement for her pain medication - agreement was to notify this office when she felt her pain was not being controlled with prescribed amount- she did not notify us but this office was contacted by NP with Palliative care regarding increased use of pain meds after she was almost out of medication.  2. Concern for taking increased prescribed opioids can have serious side effects including death.  3. Goal of this office is to manage her pain appropriately and as safe as possible.  [name] stated understanding as well as " so I can get it refilled on the 10 th ?"  This RN informed her above will need to be reviewed with MD - this RN also discussed possible need for admission to control pain and then be discharged with prescriptions being written weekly.  [name] declined the above.  This RN will review pt's call with MD and call will be returned to patient.

## 2016-11-08 NOTE — Telephone Encounter (Signed)
See other phone note entered by this RN

## 2016-11-08 NOTE — Telephone Encounter (Signed)
Pt's daughter, [name], called stating she was waiting to hear back from Val about getting pt's Methadone refilled?  Informed her when she called earlier she only asked about Opana and Oxycodone.  Daughter then corrected that she is asking about Opana and Oxycodone, not Methadone.  Informed her that according to Val's phone note, she informed her mother that these Rxs are not due until January 10th?  Daughter says she understands but still kept asking if she could pick up the Rxs today?  Informed her I will forward her message to Val since she was also going to discuss w/ Dr. Jana Hakim.  She verbalized understanding and states needs a call back soon because "it is getting late."

## 2016-11-14 ENCOUNTER — Other Ambulatory Visit: Payer: Self-pay | Admitting: *Deleted

## 2016-11-14 MED ORDER — SERTRALINE HCL 50 MG PO TABS: 50.0000 mg | ORAL_TABLET | Freq: Every day | ORAL | 3 refills | Status: DC

## 2016-11-16 ENCOUNTER — Telehealth: Payer: Self-pay | Admitting: *Deleted

## 2016-11-16 ENCOUNTER — Ambulatory Visit (HOSPITAL_BASED_OUTPATIENT_CLINIC_OR_DEPARTMENT_OTHER): Payer: Commercial Managed Care - PPO | Admitting: Oncology

## 2016-11-16 ENCOUNTER — Other Ambulatory Visit: Payer: Self-pay | Admitting: *Deleted

## 2016-11-16 DIAGNOSIS — C778 Secondary and unspecified malignant neoplasm of lymph nodes of multiple regions: Secondary | ICD-10-CM | POA: Diagnosis not present

## 2016-11-16 DIAGNOSIS — G8929 Other chronic pain: Secondary | ICD-10-CM | POA: Diagnosis not present

## 2016-11-16 DIAGNOSIS — I890 Lymphedema, not elsewhere classified: Secondary | ICD-10-CM

## 2016-11-16 DIAGNOSIS — C4911 Malignant neoplasm of connective and soft tissue of right upper limb, including shoulder: Secondary | ICD-10-CM

## 2016-11-16 DIAGNOSIS — C50012 Malignant neoplasm of nipple and areola, left female breast: Secondary | ICD-10-CM

## 2016-11-16 DIAGNOSIS — Z170 Estrogen receptor positive status [ER+]: Secondary | ICD-10-CM

## 2016-11-16 DIAGNOSIS — C50211 Malignant neoplasm of upper-inner quadrant of right female breast: Secondary | ICD-10-CM

## 2016-11-16 DIAGNOSIS — Z86718 Personal history of other venous thrombosis and embolism: Secondary | ICD-10-CM | POA: Diagnosis not present

## 2016-11-16 DIAGNOSIS — G893 Neoplasm related pain (acute) (chronic): Secondary | ICD-10-CM

## 2016-11-16 MED ORDER — OXYMORPHONE HCL ER 15 MG PO TB12: 15.0000 mg | ORAL_TABLET | Freq: Two times a day (BID) | ORAL | 0 refills | Status: DC

## 2016-11-16 MED ORDER — OXYCODONE-ACETAMINOPHEN 10-325 MG PO TABS: 1.0000 | ORAL_TABLET | Freq: Three times a day (TID) | ORAL | 0 refills | Status: DC | PRN

## 2016-11-16 MED ORDER — OXYCODONE-ACETAMINOPHEN 5-325 MG PO TABS: 1.0000 | ORAL_TABLET | Freq: Three times a day (TID) | ORAL | 0 refills | Status: DC | PRN

## 2016-11-16 NOTE — Telephone Encounter (Signed)
This RN was contacted by [facility] per need for oxymorphone ER prior authorization.  Was given contact number [phone] Pt ID# [id number]  Per above contact spoke with customer representative -who asked computerized generated questions.  This RN gave pt's demos and diagnosis code as well as established medication request for continuity of care in pt with uncontrolled pain for metastatic cancer.  Note post giving above information this RN was asked if pain medication is for malignant carcinoma - with customer rep spelling malignant.  Post 15 call informed above would be further reviewed - authorization pending.  This RN asked if RN could speak to pharmacy or MD per urgent need and was informed " they are in a different place then [name] and I do not have any number for them "  Informed review will be made by 3 pm tomorrow.

## 2016-11-16 NOTE — Progress Notes (Signed)
[name]  [phone]) [phone] [phone]  OFFICE PROGRESS NOTE   ID: Shakia Sebastiano   DOB: [birth year]  MR#: [id number]  RFF#:[id number]   PCP: [name], MD [name], [name], [name] RAD JTT:[id number] [name], [name] OTHER:  [name], MD, [name]  CHIEF COMPLAINT:  Stage IV Breast Cancer, angiosarcoma  CURRENT TREATMENT: Abraxane, trastuzumab, pertuzumab  BREAST CANCER HISTORY: From the original intake note:  The patient originally presented in May 2003 when she noticed a lump in the upper inner quadrant of her right breast in September 2003.  She sought attention and had a mammogram which showed an obvious carcinoma in the upper inner quadrant of the right breast, approximately 2 cm.  There were some enlarged lymph nodes in the axilla and an FNA done showed those consistent with malignant cells, most likely an invasive ductal carcinoma.  At that point she was unsure of what to do and was referred by Dr. [name] for a discussion of her treatment options.  By biopsy, it was ER/PR negative and HER-2 was 3+.  DNA index was 1.42.  We reiterated that it was most important to have her disease surgically addressed at that point and had recommended lumpectomy and axillary nodes to be addressed with which she followed through and on 04-03-02, Dr. [name] performed a right partial mastectomy and a right axillary lymph node dissection.  Final pathology revealed a 2.4 cm. high grade, Grade III invasive ductal carcinoma with an adjacent .8 cm. also high grade invasive ductal carcinoma which was felt to represent an intramammary metastasis rather than a second primary.  A smaller mass was just medial to the larger mass.  The smaller mass was associated with high grade DCIS component.  There was no definite lymphovascular invasion identified.  However, one of fourteen axillary lymph nodes did contain a 1.5 cm. metastatic  deposit.  Postoperatively she did very well.  We reiterated the fact that she did need adjuvant chemotherapy, however, she refused and decided that she would pursue radiation.  She received radiation and completed that on 08-14-02. Her subsequent history is as detailed below.   INTERVAL HISTORY: Gale returns today for continuing follow-up of her metastatic breast cancer and angiosarcoma. She is receiving Abraxane, trastuzumab and pertuzumab supposedly every 21 days. Her most recent treatment was 10/11/2016. She was supposed to have been treated December 28 but did not show.  She tells me she generally does well with the chemotherapy. It doesn't make her feel good for a few days but she doesn't vomit, does not have mouth sores, and she can tell that it does help the skin lesions on her right arm and right upper back. The port is working well.  REVIEW OF SYSTEMS: Elisha tells me he depresses her to come here. She had to leave early today she says because she had to pick up her grandson from school and she keeps in several hours most days. She denies unusual headaches, visual changes, mouth sores, cough, phlegm production or pleurisy. She tells me when she takes her pain medicine as prescribed it works well for her and in particular the oxymorphone allows her to sleep through most of the night. Despite the narcotics she has good bowel movements. A detailed review of systems today was otherwise stable.  PAST MEDICAL HISTORY: Past Medical History:  Diagnosis Date  . Breast cancer ([place])   . DVT (deep venous thrombosis) ([place]) 10/07/2011  . Hypertension   .  Radiation 11/29/2005-12/29/2005   right supraclavicular area 4147 cGy  . Radiation 06/26/02-08/14/02   right breast 5040 cGy, tumor bed boosted to 1260 cGy  . Rheumatic fever     PAST SURGICAL HISTORY: Past Surgical History:  Procedure Laterality Date  . BREAST SURGERY    . MASTECTOMY     bilateral  . PORT A CATH REVISION      FAMILY  HISTORY Family History  Problem Relation Age of Onset  . Pancreatic cancer Mother   . Kidney cancer Sister 45  . Breast cancer Sister 61  . Breast cancer Other 18  The patient's father died in his 57D  from complications of alcohol abuse. The patient's mother died in her 24s from pancreatic cancer. The patient has 6 brothers, 2 sisters. Both her sisters have had breast cancer, both diagnosed after the age of 67. The patient has not had genetic testing so far.   GYNECOLOGIC HISTORY: Menarche age 79; first live birth age 75. She is GXP4. She is not sure when she stopped having periods. She never used hormone replacement.  SOCIAL HISTORY: The patient has worked in the past as a Psychologist, counselling. At home in addition to the patient are her husband [name], originally from [place], who works as a Administrator.  Also living in the home is her son [name] and her daughter [name] (she is studying to be a Marine scientist).  The other 2 children are [name], who has a college degree in psychology and works as a Probation officer; and [name], who lives in [place] and works as a Higher education careers adviser.   ADVANCED DIRECTIVES: Not in place  HEALTH MAINTENANCE: Social History  Substance Use Topics  . Smoking status: Light Tobacco Smoker  . Smokeless tobacco: Never Used  . Alcohol use Yes     Comment: Rarely     Colonoscopy:  Not on file  PAP:  Not on file  Bone density:  Not on file  Lipid panel:  Not on file    Allergies  Allergen Reactions  . Tramadol Nausea Only  . Hydrocodone-Acetaminophen Itching and Rash    Current Outpatient Prescriptions  Medication Sig Dispense Refill  . alprazolam (XANAX) 2 MG tablet Take 2 mg by mouth daily.     . Ascorbic Acid (VITAMIN C) 100 MG tablet Take 100 mg by mouth daily.      . calcium-vitamin D (OSCAL WITH D) 500-200 MG-UNIT per tablet Take 1 tablet by mouth daily.      . diphenoxylate-atropine (LOMOTIL) 2.5-0.025 MG tablet Take 1 tablet by mouth 4 (four) times daily as needed  for diarrhea or loose stools. 60 tablet 1  . gabapentin (NEURONTIN) 300 MG capsule Take 1 capsule (300 mg total) by mouth at bedtime. 90 capsule 4  . Multiple Vitamin (MULTIVITAMIN) capsule Take 1 capsule by mouth daily.      . mupirocin ointment (BACTROBAN) 2 % APPLY ON THE SKIN TWICE A DAY  3  . ondansetron (ZOFRAN) 8 MG tablet TAKE ONE TABLET BY MOUTH TWICE DAILY AS NEEDED FOR NAUSEA AND VOMITING 30 tablet 1  . oxyCODONE-acetaminophen (PERCOCET) 10-325 MG tablet Take 1 tablet by mouth every 8 (eight) hours as needed for pain. 45 tablet 0  . oxymorphone (OPANA ER) 15 MG 12 hr tablet Take 1 tablet (15 mg total) by mouth every 12 (twelve) hours. 15 tablet 0  . predniSONE (DELTASONE) 5 MG tablet Take 2 tablets (10 mg total) by mouth daily with breakfast. 60 tablet 6  . prochlorperazine (COMPAZINE)  10 MG tablet Take 1 tablet (10 mg total) by mouth every 6 (six) hours as needed (Nausea or vomiting). 30 tablet 1  . sertraline (ZOLOFT) 50 MG tablet Take 1 tablet (50 mg total) by mouth daily. 30 tablet 3  . triamterene-hydrochlorothiazide (DYAZIDE) 37.5-25 MG capsule Take 1 each (1 capsule total) by mouth every morning. 30 capsule 1  . valACYclovir (VALTREX) 500 MG tablet Take 1 tablet (500 mg total) by mouth 2 (two) times daily. 30 tablet 12   No current facility-administered medications for this visit.    Facility-Administered Medications Ordered in Other Visits  Medication Dose Route Frequency Provider Last Rate Last Dose  . sodium chloride 0.9 % injection 10 mL  10 mL Intravenous PRN Chauncey Cruel, MD   10 mL at 03/04/14 1421    OBJECTIVE: Middle-aged [place] American woman Who appears stated age  23:   11/16/16 1314  BP: 124/78  Pulse: 83  Resp: 16  Temp: 97.6 F (36.4 C)     Body mass index is 27.65 kg/m.    ECOG FS: 2 Filed Weights   11/16/16 1314  Weight: 171 lb 4.8 oz (77.7 kg)   Sclerae unicteric, pupils round and equal Oropharynx clear, slightly dry No cervical or  supraclavicular adenopathy Lungs no rales or rhonchi Heart regular rate and rhythm Abd soft, positive bowel sounds MSK chronic right upper extremity lymphedema Neuro: nonfocal, well oriented, detached affect Breasts: Deferred    Back and left arm 10/06/2016    Right upper extremity 07/19/2016    LAB RESULTS:.  Lab Results  Component Value Date   WBC 5.7 10/11/2016   NEUTROABS 3.3 10/11/2016   HGB 12.6 10/11/2016   HCT 39.9 10/11/2016   MCV 84.0 10/11/2016   PLT 311 10/11/2016      Chemistry      Component Value Date/Time   NA 139 10/11/2016 1058   K 3.8 10/11/2016 1058   CL 100 08/14/2014 0100   CL 101 03/07/2013 1411   CO2 30 (H) 10/11/2016 1058   BUN 9.1 10/11/2016 1058   CREATININE 0.8 10/11/2016 1058      Component Value Date/Time   CALCIUM 9.9 10/11/2016 1058   ALKPHOS 118 10/11/2016 1058   AST 19 10/11/2016 1058   ALT 11 10/11/2016 1058   BILITOT 0.26 10/11/2016 1058       STUDIES: No results found.   ASSESSMENT:  61 y.o. [place] woman with stage IV breast cancer manifested chiefly by loco-regional nodal disease (neck, chest), without lung, liver, bone, or brain involvement.  (1) Status post right upper inner quadrant lumpectomy and sentinel lymph node sampling 03/04/2002 for 2 separate foci of invasive ductal carcinoma, mpT2 pN1 or stage IIB, both foci grade 3, both estrogen and progesterone receptor positive, both HercepTest 3+, Mib-1 56%  (2) Reexcision for margins 05/27/2002 showed no residual cancer in the breast.  (3) The patient refused adjuvant systemic therapy.  (4) Adjuvant radiation treatment completed 08/14/2002.  (5) recurrence in the right breast in 02/2004 showing a morphologically different tumor, again grade 3, again estrogen and progesterone receptor negative, with an MIB-1 of 14% and Herceptest 3+.  (5) Between 03/2004 and 07/2004 she received dose dense Doxorubicin/Cyclophosphamide x 4 given with trastuzumab, followed by  weekly Docetaxel x 8, again given with trastuzumab.  (6) Right mastectomy 07/13/2004 showed scattered microscopic foci of residual disease over an area  greater than 5 cm. Margins were negative.  (7) Postoperative Docetaxel continued until 09/2004.  (8) Trastuzumab (Herceptin)  given 08/2004 through 01/2012 with some brief interruptions.  (9) Isolated right cervical nodal recurrence 10/2005, treated with radiation to the right supraclavicular area (total 41.5 gray) completed 12/29/2005.  (10) Navelbine given together with Herceptin  11/2005 through 03/2006.  (9) Left mastectomy 02/13/2006 for ductal carcinoma in situ, grade 2, estrogen and progesterone receptor negative, with negative margins; 0 of 3 lymph nodes involved  (10) treated with Lapatinib and Capecitabine before 10/2009, for an unclear duration and with unclear results (cannot locate data on chart review).  (11) Status post right supraclavicular lymph node biopsy 09/2010 again positive for an invasive ductal carcinoma, estrogen and progesterone receptor negative, HER-2 positive by CISH with a ratio 4.25.  (12) Navelbine given together with Herceptin between 05/2011 and 11/2011.  (13) Carboplatin/ Gemcitabine/ Herceptin given for 2 cycles, in 12/2011 and 01/2012.  (14) TDM-1 ([place]) started 02/2012. Last dose 10/02/2013 after which the patient discontinued treatment at her own discretion. Echo on December 2014 showed a well preserved ejection fraction.  (15) Deep vein thrombosis of the right upper extremity documented 04/20/2011.  She completed anticoagulant therapy with Coumadin on 03/25/2013.  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 Left chest Port-A-Cath placed on 04/08/2013; being  flushed every 6 weeks  RIGHT UPPER EXTREMITY ANGIOSARCOMA VS BREAST CANCER: (18) treated at [facility] [place] August 2016 with paclitaxel, trastuzumab and pertuzumab 1 cycle, afterwards referred to hospice  (19) under the care of hospice of [name] August 2016 to 05/08/2016, when the patient opted for a second try at chemotherapy  (20) started low-dose Abraxane, trastuzumab and pertuzumab 06/07/2016, to be repeated every 3 weeks.   (a) pretreatment echocardiogram 06/06/2016 showed a 60-65% ejection fraction  (b) significant compliance problems compromise response to treatment  (21) Chronic pain secondary to known metastatic disease  (a) patient signed pain contract 10/19/2016  (b) as of 11/16/2016 to receive her pain medication on every 14 dayschedule    PLAN: Managing Lexia's situation is very difficult, at least for me, and we spent well over 40 minutes today going over her problems.  First of all she has metastatic incurable breast cancer and it is definitely causing her pain. She does respond to the current treatment and she is tolerating it well. Unfortunately she receives it somewhat irregularly, for a variety of reasons--she went out of town when her ex-husband died, she tells me she became depressed when she had a falling out with her best friend, etc. For whatever reason, she does not receive the treatments as scheduled but when she does receive them they work and today I was very pleased to see that the open wounds in her upper left back are healing over  Then we have the fact that she does have a problem with narcotics. We have discussed this in detail, and she has signed and agreement. It doesn't seem to make much difference. If she "runs out" of medication early, "loses" her medication, etc. She is also getting some pain medication from Dr. Laren Everts, her primary care physician. Review of her records in the [place] controlled substances reporting system  shows that she received 90 oxymorphone tablets from Dr. [name] on 10/25/2016 and 120 oxycodone/acetaminophen tablets 10/19/2016  Today she brought back to the methadone bottles and tells me that the methadone makes  her feel jittery and sleepy and she doesn't like it. She does not want to take it. It makes her nervous. She says the only thing that works for her as far as pain is concerned is oxymorphone and oxycodone.  She would like to have both in short acting forms, but I certainly see no reason for that. I suggested she try the oxymorphone in long acting, extended release form twice a day and then use the oxycodone/acetaminophen in between as needed.  I suggested it would be easier for her if she only received 1 weeks worth of pain medicine at a time. That way she can "run out early" for 2 many days. She says that it depresses her to come here so she does not want to come here except every 2 weeks.  Accordingly we are going to give that a try. Today I wrote her for 30 oxymorphone 50 mg tablets to take twice daily and for 45 oxycodone/acetaminophen 10/325. She will return in 14 days to pick up the next prescription. She will not receive any other narcotics from [name] and she understands that if she receives narcotics from any other physician we will stop prescribing these for her.  I am hopeful this may work for [facility]. My goal with her is to control the tumor and to make sure she is comfortable. I also appreciate the interventions of the in home palliative care nurse and encouraged Margarette to allow those to continue  Her next chemotherapy will be 11/25/2015. Treatments are to be repeated every 21 days. She will need an echocardiogram sometime this month.   Chauncey Cruel, MD  11/16/2016 1:55 PM

## 2016-11-22 ENCOUNTER — Other Ambulatory Visit: Payer: Commercial Managed Care - PPO

## 2016-11-22 ENCOUNTER — Ambulatory Visit: Payer: Commercial Managed Care - PPO

## 2016-11-24 ENCOUNTER — Ambulatory Visit: Payer: Commercial Managed Care - PPO | Admitting: Oncology

## 2016-11-24 ENCOUNTER — Ambulatory Visit: Payer: Commercial Managed Care - PPO

## 2016-11-24 ENCOUNTER — Other Ambulatory Visit: Payer: Commercial Managed Care - PPO

## 2016-11-25 ENCOUNTER — Telehealth: Payer: Self-pay | Admitting: *Deleted

## 2016-11-25 NOTE — Telephone Encounter (Signed)
This RN spoke with pt per her walk in to the clinic at [facility] stated very directly in a stern voice  " you messed up my medication and didn't give me enough "   This RN informed pt prescriptions were given per MD and pt discussion and decision at last visit for a 2 week supply.  [name] states she is " now out of medication "  This RN informed pt medication and dosing was discussed at visit with dispense amount appropriate for a 2 week supply if she is taking them as prescribed.  [name] stated she is taking 3 of the oxymorphone a day not 2.  This RN reiterated above is not how medication is prescribed as well as prescribing MD not in the office at this time.  [name] continued to state " Dr [name] said the pills interact with each other and that is why he was giving me the other doses "  This RN informed pt current dosing is per this MD at this practice- pt needs to choose to obtain pain medications by this office or Dr [name] but cannot be receiving by both practices.  [name] stated " I will get them from Dr [name] and Dr [name] can take care of my therapy "  Pt also stated " Dr [name] took my other pills so now I don't have any and I paid for them " This RN informed pt per concerns with opioids need to surrender the above to obtain further prescription for other opioids as well as pt gave them to the doctor and they were destroyed.  Appointment for therapy also discussed with pt having to cancel on 1/17 due to the weather. [name] states she would only like to get therapy every 6 weeks - not every 3 weeks. This RN informed her above would be reviewed with MD and she would be called with appointment.  Per end of conversation [name] was in a more placid mood including giving this nurse a hug and stating thank you.  This note will be given to MD for review and recommendations for therapy.

## 2016-11-28 ENCOUNTER — Other Ambulatory Visit: Payer: Self-pay | Admitting: *Deleted

## 2016-11-28 ENCOUNTER — Telehealth: Payer: Self-pay | Admitting: *Deleted

## 2016-11-28 NOTE — Telephone Encounter (Signed)
This RN spoke with pt per her call to Triage nurse stating ongoing weakness with nausea over the weekend.  Triage nurse could not obtain a good history of complaints with pt being vague or not ending sentences well.  Except pt saying " I am out of my nausea pills "  This RN spoke with pt and inquired further including inquiry regarding if pt increased her use of pain medications over the weekend.  Sonnet stated " yea, I think I did. I think I was taking like 6 of them and I was only suppose to take 4 " though [name] really could not clearly give a history of her weekend.  Pt not able to state what pills per above.  This RN informed pt symptoms may be related to increased use of prescription pain medications, stated concern for continued symptoms as well as possible respiratory issues related to concern.  [name] stated " I am breathing fine but just can't eat "  With further questioning [name] is able to get out of the bed and go to the bathroom when needed, she denies any episodes of incontinence.  She is home alone with her " husband " working out of town and her daughter who lives with her away on a vacation>  [name] states her other daughter will be off work at 58 ( the grandson that [name] helps watch is at day care ) and will be able to check on her.  This RN reiterated concern for possible over dose of opioids that could cause further symptoms and recommended for pt to proceed to the ER and to take all her bottles of pills with her for proper evaluation - and monitoring due to concerns.  [name] stated " yea maybe that is what I need to do - I will talk to my daughter when she comes "  [name] then stated she needed to reschedule her chemo therapy from last week due to weather- stating she would like to come this week.  This RN reviewed her request from Friday where she stated she wanted to be treated every 6 weeks - [name] stated " No I want to have my chemo this  week "  Call ended with [name] stating she will speak her daughter per above concerns.  This RN will review above with MD as well as request rescheduling of appointment for therapy.

## 2016-11-29 ENCOUNTER — Other Ambulatory Visit: Payer: Self-pay | Admitting: *Deleted

## 2016-11-29 DIAGNOSIS — C50211 Malignant neoplasm of upper-inner quadrant of right female breast: Secondary | ICD-10-CM

## 2016-11-29 DIAGNOSIS — C4911 Malignant neoplasm of connective and soft tissue of right upper limb, including shoulder: Secondary | ICD-10-CM

## 2016-11-29 MED ORDER — TRIAMTERENE-HCTZ 37.5-25 MG PO CAPS: 1.0000 | ORAL_CAPSULE | Freq: Every morning | ORAL | 1 refills | Status: DC

## 2016-11-29 MED ORDER — PROCHLORPERAZINE MALEATE 10 MG PO TABS: 10.0000 mg | ORAL_TABLET | Freq: Four times a day (QID) | ORAL | 1 refills | Status: DC | PRN

## 2016-11-30 ENCOUNTER — Telehealth: Payer: Self-pay | Admitting: Oncology

## 2016-11-30 NOTE — Telephone Encounter (Signed)
lvm to inform pt to return call to office to r/s lab/flush/inf appt. Pt request to r/s to 1/25. Due to infusion being capped 1/25, 1/26, 1/29, and 1/30 the next available date will be 1/31.

## 2016-12-01 ENCOUNTER — Emergency Department (HOSPITAL_COMMUNITY): Payer: Commercial Managed Care - PPO

## 2016-12-01 ENCOUNTER — Emergency Department (HOSPITAL_COMMUNITY)
Admission: EM | Admit: 2016-12-01 | Discharge: 2016-12-02 | Disposition: A | Discharge status: Home / Self Care | Payer: Commercial Managed Care - PPO | Attending: Emergency Medicine | Admitting: Emergency Medicine

## 2016-12-01 DIAGNOSIS — Z79899 Other long term (current) drug therapy: Secondary | ICD-10-CM | POA: Diagnosis not present

## 2016-12-01 DIAGNOSIS — Z853 Personal history of malignant neoplasm of breast: Secondary | ICD-10-CM | POA: Diagnosis not present

## 2016-12-01 DIAGNOSIS — R531 Weakness: Secondary | ICD-10-CM | POA: Diagnosis present

## 2016-12-01 DIAGNOSIS — I10 Essential (primary) hypertension: Secondary | ICD-10-CM | POA: Insufficient documentation

## 2016-12-01 DIAGNOSIS — C50919 Malignant neoplasm of unspecified site of unspecified female breast: Secondary | ICD-10-CM | POA: Diagnosis not present

## 2016-12-01 DIAGNOSIS — F17200 Nicotine dependence, unspecified, uncomplicated: Secondary | ICD-10-CM | POA: Insufficient documentation

## 2016-12-01 DIAGNOSIS — E860 Dehydration: Secondary | ICD-10-CM | POA: Diagnosis not present

## 2016-12-01 DIAGNOSIS — R8271 Bacteriuria: Secondary | ICD-10-CM

## 2016-12-01 DIAGNOSIS — N390 Urinary tract infection, site not specified: Secondary | ICD-10-CM | POA: Diagnosis not present

## 2016-12-01 LAB — URINALYSIS, ROUTINE W REFLEX MICROSCOPIC
Bilirubin Urine: NEGATIVE
Glucose, UA: NEGATIVE mg/dL
Hgb urine dipstick: NEGATIVE
Ketones, ur: NEGATIVE mg/dL
Nitrite: NEGATIVE
Protein, ur: NEGATIVE mg/dL
Specific Gravity, Urine: 1.017 (ref 1.005–1.030)
pH: 6 (ref 5.0–8.0)

## 2016-12-01 LAB — CBC
HCT: 37.7 % (ref 36.0–46.0)
Hemoglobin: 12.4 g/dL (ref 12.0–15.0)
MCH: 27.7 pg (ref 26.0–34.0)
MCHC: 32.9 g/dL (ref 30.0–36.0)
MCV: 84.3 fL (ref 78.0–100.0)
Platelets: 254 10*3/uL (ref 150–400)
RBC: 4.47 MIL/uL (ref 3.87–5.11)
RDW: 15.3 % (ref 11.5–15.5)
WBC: 10.9 10*3/uL — ABNORMAL HIGH (ref 4.0–10.5)

## 2016-12-01 LAB — BASIC METABOLIC PANEL
Anion gap: 9 (ref 5–15)
BUN: 14 mg/dL (ref 6–20)
CO2: 28 mmol/L (ref 22–32)
Calcium: 9.3 mg/dL (ref 8.9–10.3)
Chloride: 99 mmol/L — ABNORMAL LOW (ref 101–111)
Creatinine, Ser: 0.77 mg/dL (ref 0.44–1.00)
GFR calc Af Amer: >60 mL/min (ref >60)
GFR calc non Af Amer: >60 mL/min (ref >60)
Glucose, Bld: 98 mg/dL (ref 65–99)
Potassium: 3.4 mmol/L — ABNORMAL LOW (ref 3.5–5.1)
Sodium: 136 mmol/L (ref 135–145)

## 2016-12-01 LAB — CBG MONITORING, ED: Glucose-Capillary: 107 mg/dL — ABNORMAL HIGH (ref 65–99)

## 2016-12-01 LAB — I-STAT CG4 LACTIC ACID, ED: Lactic Acid, Venous: 0.74 mmol/L (ref 0.5–1.9)

## 2016-12-01 MED ORDER — SODIUM CHLORIDE 0.9 % IV BOLUS (SEPSIS)
1000.0000 mL | Freq: Once | INTRAVENOUS | Status: AC
  Administered 2016-12-02: 1000 mL via INTRAVENOUS

## 2016-12-01 NOTE — ED Triage Notes (Addendum)
PT C/O GENERALIZED WEAKNESS SINCE Saturday. PT DENIES PAIN, FEVER, OR COUGH. PT CURRENTLY ON CHEMO FOR BILATERAL BREAST CA. PT STS SHE MISSED HER CHEMO LAST WEEK DUE TO THE WEATHER.

## 2016-12-01 NOTE — ED Notes (Signed)
Waiting for pump in order to run fluids through port.

## 2016-12-01 NOTE — ED Notes (Addendum)
Pt in and out of triage room, ambulatory, with a steady gait

## 2016-12-02 DIAGNOSIS — E860 Dehydration: Secondary | ICD-10-CM | POA: Diagnosis not present

## 2016-12-02 MED ORDER — HYDROMORPHONE HCL 1 MG/ML IJ SOLN
1.0000 mg | Freq: Once | INTRAMUSCULAR | Status: AC
  Administered 2016-12-02: 1 mg via INTRAVENOUS
  Filled 2016-12-02: qty 1

## 2016-12-02 MED ORDER — LEVOFLOXACIN 750 MG PO TABS: 750.0000 mg | ORAL_TABLET | Freq: Once | ORAL | Status: DC

## 2016-12-02 MED ORDER — POTASSIUM CHLORIDE CRYS ER 20 MEQ PO TBCR
40.0000 meq | EXTENDED_RELEASE_TABLET | Freq: Once | ORAL | Status: AC
  Administered 2016-12-02: 40 meq via ORAL
  Filled 2016-12-02: qty 2

## 2016-12-02 MED ORDER — HEPARIN SOD (PORK) LOCK FLUSH 100 UNIT/ML IV SOLN
500.0000 [IU] | Freq: Once | INTRAVENOUS | Status: AC
  Administered 2016-12-02: 500 [IU]
  Filled 2016-12-02: qty 5

## 2016-12-02 MED ORDER — CEFPODOXIME PROXETIL 200 MG PO TABS
100.0000 mg | ORAL_TABLET | Freq: Once | ORAL | Status: AC
  Administered 2016-12-02: 100 mg via ORAL
  Filled 2016-12-02: qty 1

## 2016-12-02 MED ORDER — CEFPODOXIME PROXETIL 100 MG PO TABS: 100.0000 mg | ORAL_TABLET | Freq: Two times a day (BID) | ORAL | 0 refills | Status: AC

## 2016-12-02 NOTE — ED Provider Notes (Signed)
[facility] DEPT Provider Note   CSN: [id number] Arrival date & time: 12/01/16  1913     History   Chief Complaint Chief Complaint  Patient presents with  . Weakness    HPI [name] is a 61 y.o. female.  HPI 61 yo F with PMHx of HTN, breast CA here with generalized weakness. Pt states that for the past week, she has felt generally tired. She missed her chemo session last week due to weather and often feels this way when missing her sessions. She has also not been taking her vitamins/supplements. She states she has bee under increased stress recently and just forgets to tke her meds. Denies any fevers. She does have some mild urinary urency, but no dysuria. No cough. Mild nasal congestion and she has known sick contacts. Strongly denies any fevers. No nausea or vomiting. No alleviating or aggravating factors.   Past Medical History:  Diagnosis Date  . Breast cancer ([place])   . DVT (deep venous thrombosis) ([place]) 10/07/2011  . Hypertension   . Radiation 11/29/2005-12/29/2005   right supraclavicular area 4147 cGy  . Radiation 06/26/02-08/14/02   right breast 5040 cGy, tumor bed boosted to 1260 cGy  . Rheumatic fever     Patient Active Problem List   Diagnosis Date Noted  . Port catheter in place 05/30/2016  . 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 Chronic pain 04/13/2015  . Left arm pain 08/18/2014  . Breast cancer of upper-inner quadrant of right female breast ([place]) 10/03/2013  . Post-lymphadenectomy lymphedema of arm 08/15/2013  . Port or reservoir infection 01/29/2013  . DVT (deep venous thrombosis) ([place]) 10/07/2011  . Chest pain 09/10/2009  . Secondary cardiomyopathy ([place]) 09/02/2009  . Essential hypertension 02/26/2009  . GERD 02/26/2009    Past Surgical History:  Procedure Laterality Date  . BREAST SURGERY    . MASTECTOMY     bilateral  . PORT A CATH REVISION      OB History    No data available       Home Medications     Prior to Admission medications   Medication Sig Start Date End Date Taking? Authorizing Provider  alprazolam Duanne Moron) 2 MG tablet Take 2 mg by mouth daily.  03/15/13  Yes Historical Provider, MD  Ascorbic Acid (VITAMIN C) 100 MG tablet Take 100 mg by mouth daily.     Yes Historical Provider, MD  calcium-vitamin D (OSCAL WITH D) 500-200 MG-UNIT per tablet Take 1 tablet by mouth daily.     Yes Historical Provider, MD  gabapentin (NEURONTIN) 300 MG capsule Take 1 capsule (300 mg total) by mouth at bedtime. 03/20/15  Yes Chauncey Cruel, MD  methadone (DOLOPHINE) 10 MG tablet Take 10 mg by mouth every 8 (eight) hours. Take with 5mg  tablet to make 15mg  total every 8 hours. 11/08/16  Yes Historical Provider, MD  methadone (DOLOPHINE) 5 MG tablet Take 5 mg by mouth every 8 (eight) hours. Take this along with 10mg  to make 15mg  total every 8 hours. 11/08/16  Yes Historical Provider, MD  Multiple Vitamin (MULTIVITAMIN) capsule Take 1 capsule by mouth daily.     Yes Historical Provider, MD  naproxen sodium (ANAPROX) 220 MG tablet Take 440 mg by mouth 2 (two) times daily with a meal.   Yes Historical Provider, MD  oxyCODONE-acetaminophen (PERCOCET) 10-325 MG tablet Take 1 tablet by mouth every 8 (eight) hours as needed for pain. 11/16/16  Yes Chauncey Cruel, MD  oxymorphone (OPANA  ER) 15 MG 12 hr tablet Take 1 tablet (15 mg total) by mouth every 12 (twelve) hours. 11/16/16  Yes Chauncey Cruel, MD  sertraline (ZOLOFT) 50 MG tablet Take 1 tablet (50 mg total) by mouth daily. 11/14/16  Yes Chauncey Cruel, MD  triamterene-hydrochlorothiazide (DYAZIDE) 37.5-25 MG capsule Take 1 each (1 capsule total) by mouth every morning. 11/29/16  Yes Chauncey Cruel, MD  cefpodoxime (VANTIN) 100 MG tablet Take 1 tablet (100 mg total) by mouth 2 (two) times daily. 12/02/16 12/09/16  Duffy Bruce, MD  diphenoxylate-atropine (LOMOTIL) 2.5-0.025 MG tablet Take 1 tablet by mouth 4 (four) times daily as needed for diarrhea or loose  stools. Patient not taking: Reported on 12/01/2016 06/30/16   Chauncey Cruel, MD  ondansetron (ZOFRAN) 8 MG tablet TAKE ONE TABLET BY MOUTH TWICE DAILY AS NEEDED FOR NAUSEA AND VOMITING Patient not taking: Reported on 12/01/2016 06/20/16   Chauncey Cruel, MD  predniSONE (DELTASONE) 5 MG tablet Take 2 tablets (10 mg total) by mouth daily with breakfast. Patient not taking: Reported on 12/01/2016 06/15/16   Chauncey Cruel, MD  prochlorperazine (COMPAZINE) 10 MG tablet Take 1 tablet (10 mg total) by mouth every 6 (six) hours as needed (Nausea or vomiting). 11/29/16   Chauncey Cruel, MD  valACYclovir (VALTREX) 500 MG tablet Take 1 tablet (500 mg total) by mouth 2 (two) times daily. Patient not taking: Reported on 12/01/2016 07/19/16   Chauncey Cruel, MD    Family History Family History  Problem Relation Age of Onset  . Pancreatic cancer Mother   . Kidney cancer Sister 13  . Breast cancer Sister 42  . Breast cancer Other 39    Social History Social History  Substance Use Topics  . Smoking status: Light Tobacco Smoker  . Smokeless tobacco: Never Used  . Alcohol use Yes     Comment: Rarely     Allergies   Pork-derived products; Tramadol; and Hydrocodone-acetaminophen   Review of Systems Review of Systems  Constitutional: Positive for fatigue. Negative for chills and fever.  HENT: Negative for congestion, rhinorrhea and sore throat.   Eyes: Negative for visual disturbance.  Respiratory: Negative for cough, shortness of breath and wheezing.   Cardiovascular: Negative for chest pain and leg swelling.  Gastrointestinal: Negative for abdominal pain, diarrhea, nausea and vomiting.  Genitourinary: Negative for dysuria, flank pain, vaginal bleeding and vaginal discharge.  Musculoskeletal: Negative for neck pain.  Skin: Negative for rash.  Allergic/Immunologic: Negative for immunocompromised state.  Neurological: Negative for syncope and headaches.  Hematological: Does not  bruise/bleed easily.  All other systems reviewed and are negative.    Physical Exam Updated Vital Signs BP 136/87 (BP Location: Left Arm)   Pulse 60   Temp 98.3 F (36.8 C) (Oral)   Resp 18   Ht 5\' 6"  (1.676 m)   Wt 165 lb (74.8 kg)   SpO2 97%   BMI 26.63 kg/m   Physical Exam  Constitutional: She is oriented to person, place, and time. She appears well-developed and well-nourished. No distress.  HENT:  Head: Normocephalic and atraumatic.  Eyes: Conjunctivae are normal.  Neck: Neck supple.  Cardiovascular: Normal rate, regular rhythm and normal heart sounds.  Exam reveals no friction rub.   No murmur heard. Pulmonary/Chest: Effort normal and breath sounds normal. No respiratory distress. She has no wheezes. She has no rales.  Abdominal: She exhibits no distension.  Musculoskeletal: She exhibits no edema.  Neurological: She is alert and oriented  to person, place, and time. She exhibits normal muscle tone.  Skin: Skin is warm. Capillary refill takes less than 2 seconds.  Psychiatric: She has a normal mood and affect.  Nursing note and vitals reviewed.    ED Treatments / Results  Labs (all labs ordered are listed, but only abnormal results are displayed) Labs Reviewed  BASIC METABOLIC PANEL - Abnormal; Notable for the following:       Result Value   Potassium 3.4 (*)    Chloride 99 (*)    All other components within normal limits  CBC - Abnormal; Notable for the following:    WBC 10.9 (*)    All other components within normal limits  URINALYSIS, ROUTINE W REFLEX MICROSCOPIC - Abnormal; Notable for the following:    Leukocytes, UA MODERATE (*)    Bacteria, UA MANY (*)    Squamous Epithelial / LPF 0-5 (*)    All other components within normal limits  CBG MONITORING, ED - Abnormal; Notable for the following:    Glucose-Capillary 107 (*)    All other components within normal limits  URINE CULTURE  I-STAT CG4 LACTIC ACID, ED    EKG  EKG Interpretation None        Radiology Dg Chest 2 View  Result Date: 12/02/2016 CLINICAL DATA:  Abdominal breast cancer.  Weakness. EXAM: CHEST  2 VIEW COMPARISON:  07/30/2016 CXR FINDINGS: The heart size and mediastinal contours are within normal limits. Surgical clips project over the right axilla. Port catheter tip is seen at the cavoatrial junction. Both lungs are clear. The visualized skeletal structures are unremarkable. IMPRESSION: No active cardiopulmonary disease. Electronically Signed   By: Ashley Royalty [name]   On: 12/02/2016 00:10    Procedures Procedures (including critical care time)  Medications Ordered in ED Medications  sodium chloride 0.9 % bolus 1,000 mL (0 mLs Intravenous Stopped 12/02/16 0110)  potassium chloride SA (K-DUR,KLOR-CON) CR tablet 40 mEq (40 mEq Oral Given 12/02/16 0126)  HYDROmorphone (DILAUDID) injection 1 mg (1 mg Intravenous Given 12/02/16 0116)  cefpodoxime (VANTIN) tablet 100 mg (100 mg Oral Given 12/02/16 0116)  heparin lock flush 100 unit/mL (500 Units Intracatheter Given 12/02/16 0130)     Initial Impression / Assessment and Plan / ED Course  I have reviewed the triage vital signs and the nursing notes.  Pertinent labs & imaging results that were available during my care of the patient were reviewed by me and considered in my medical decision making (see chart for details).   61 yo F with h/o breast CA on chemo here with general fatigue after missing chemo session. H/o similar responses in the past. On arrival, VSS and pt very well-appearing, non-toxic on exam. Screening lab work is overall very reassuring with normal WBC, normal lactic acid. BMP shows mild hypokalemia, o/w unremarakble. UA does have +bacteria on good sample, raising question of early UTI with no signs of pyelo. Pt is tolerating PO. Suspect mild fatigue 2/2 underlying cancer, versus URI, versus UTI. No evidence of sepsis or emergent pathology. Pt feels improved after fluids, potassium. Will d/c with keflex, outpt  f/u.   Final Clinical Impressions(s) / ED Diagnoses   Final diagnoses:  Weakness  Dehydration  Bacteriuria    New Prescriptions Discharge Medication List as of 12/02/2016  1:01 AM    START taking these medications   Details  cefpodoxime (VANTIN) 100 MG tablet Take 1 tablet (100 mg total) by mouth 2 (two) times daily., Starting Fri 12/02/2016, Until Fri  12/09/2016, Print         Duffy Bruce, MD 12/02/16 [phone]

## 2016-12-04 LAB — URINE CULTURE
Culture: 70000 — AB
Special Requests: NORMAL

## 2016-12-05 ENCOUNTER — Telehealth: Payer: Self-pay | Admitting: Emergency Medicine

## 2016-12-05 NOTE — Telephone Encounter (Signed)
Post ED Visit - Positive Culture Follow-up  Culture report reviewed by antimicrobial stewardship pharmacist:  []  Elenor Quinones, Pharm.D. []  Heide Guile, Pharm.D., BCPS []  Parks Neptune, Pharm.D. []  Alycia Rossetti, Pharm.D., BCPS []  [place], [place].D., BCPS, AAHIVP []  Legrand Como, Pharm.D., BCPS, AAHIVP []  Milus Glazier, Pharm.D. []  Stephens November, [place].DDemetrius Charity PharmD  Positive urine culture Treated with cefpodoxime, organism sensitive to the same and no further patient follow-up is required at this time.  Hazle Nordmann 12/05/2016, 10:35 AM

## 2016-12-11 ENCOUNTER — Emergency Department (HOSPITAL_COMMUNITY)
Admission: EM | Admit: 2016-12-11 | Discharge: 2016-12-11 | Disposition: A | Discharge status: Home / Self Care | Payer: Commercial Managed Care - PPO | Attending: Emergency Medicine | Admitting: Emergency Medicine

## 2016-12-11 ENCOUNTER — Encounter (HOSPITAL_COMMUNITY): Payer: Self-pay | Admitting: Emergency Medicine

## 2016-12-11 DIAGNOSIS — Z5321 Procedure and treatment not carried out due to patient leaving prior to being seen by health care provider: Secondary | ICD-10-CM | POA: Insufficient documentation

## 2016-12-11 DIAGNOSIS — Z760 Encounter for issue of repeat prescription: Secondary | ICD-10-CM | POA: Insufficient documentation

## 2016-12-11 NOTE — ED Provider Notes (Signed)
Per nursing note patient was seeking refills on her methadone. I did see patient outside of her room while seeing others in fast track area. She asked when she would be seen and I told her shortly.  When I came to see patient RN reports she eloped.  I looked up the patient on the [place] controlled substance database. She received a 30 day supply of Percocet 10 mg tablets including 120 tablets on 11/24/2016. She also received 30 day supply of methadone on 11/22/2016. This is less than 30 days ago. Patient should have plenty of pain medicine at home. I would not prescribe this patient additional narcotic pain medicines at this time. Patient eloped prior to my evaluation of this patient.   Waynetta Pean, PA-C 12/11/16 2122    Julianne Rice, MD 12/14/16 [phone]

## 2016-12-11 NOTE — ED Notes (Addendum)
Pt ambulated out of the ED with a steady gait. LWBS.

## 2016-12-11 NOTE — ED Triage Notes (Signed)
Pt states she was here recently and was discharged with prescription of methadone but cannot find it and is in a lot of pain and needs it. Pt was supposed to see oncologist on 1/15 but did not go due to weather conditions.

## 2016-12-12 ENCOUNTER — Encounter: Payer: Commercial Managed Care - PPO | Admitting: Nurse Practitioner

## 2016-12-12 ENCOUNTER — Ambulatory Visit (HOSPITAL_BASED_OUTPATIENT_CLINIC_OR_DEPARTMENT_OTHER): Payer: Commercial Managed Care - PPO | Admitting: Adult Health

## 2016-12-12 ENCOUNTER — Telehealth: Payer: Self-pay | Admitting: *Deleted

## 2016-12-12 VITALS — BP 112/69 | HR 76 | Temp 98.0°F | Resp 18 | Ht 66.0 in | Wt 164.9 lb

## 2016-12-12 DIAGNOSIS — C4911 Malignant neoplasm of connective and soft tissue of right upper limb, including shoulder: Secondary | ICD-10-CM

## 2016-12-12 DIAGNOSIS — C50211 Malignant neoplasm of upper-inner quadrant of right female breast: Secondary | ICD-10-CM

## 2016-12-12 NOTE — Progress Notes (Signed)
I did not see Rama today, however, after discussion with Hinda Lenis, RN patient had come in asking for pain medications to be refilled.  She has gotten several different medications in the past month from both Dr. Jana Hakim along with Dr. [name].  Dr. Jana Hakim last saw the patient in early January, and told her that if she received pain medication from any other physician, he would no longer prescribe it.  On [facility] [facility] system patient has had both Oxymorphone, and Percocet refilled by Dr. [name] this month after she had prescriptions filled by Dr. Jana Hakim.  I personally called Dr. [name] and told him about the situation.  He said that he was her PCP and that he was taking care of her and her cancer pain and had been for years and he was going to prescribe her pain medications.  He informed me that the patient was under a pain contract with him, and he tested urine drug screens on her, etc.  I told Dr. [name] that we would not prescribe anything else for pain for patient at the cancer center.  I also relayed this message to Hinda Lenis, RN, who informed the patient.

## 2016-12-12 NOTE — Telephone Encounter (Signed)
This RN received 2 VM as well as speaking to the patient directly - per her request for refill on methadone- " because when I came in there last time you took it from me so I don't have it "  This RN reviewed with pt above was given to MD due to she obtained other pain medication prescriptions. As well as noted pt received percocet thru Dr [name] -  Patient then walked in to this office to speak to this RN.  This RN per above informed pt of need to see a provider to obtain refills.  Scheduled with mid level.

## 2016-12-13 IMAGING — CR DG CHEST 1V PORT
1 series · 1 of 1 positions shown · non-contrast
Comparison: 12/08/2014

CLINICAL DATA: Altered mental status today, family in room thinks
may have taken to many medications.

EXAM:
PORTABLE CHEST 1 VIEW

[AP]
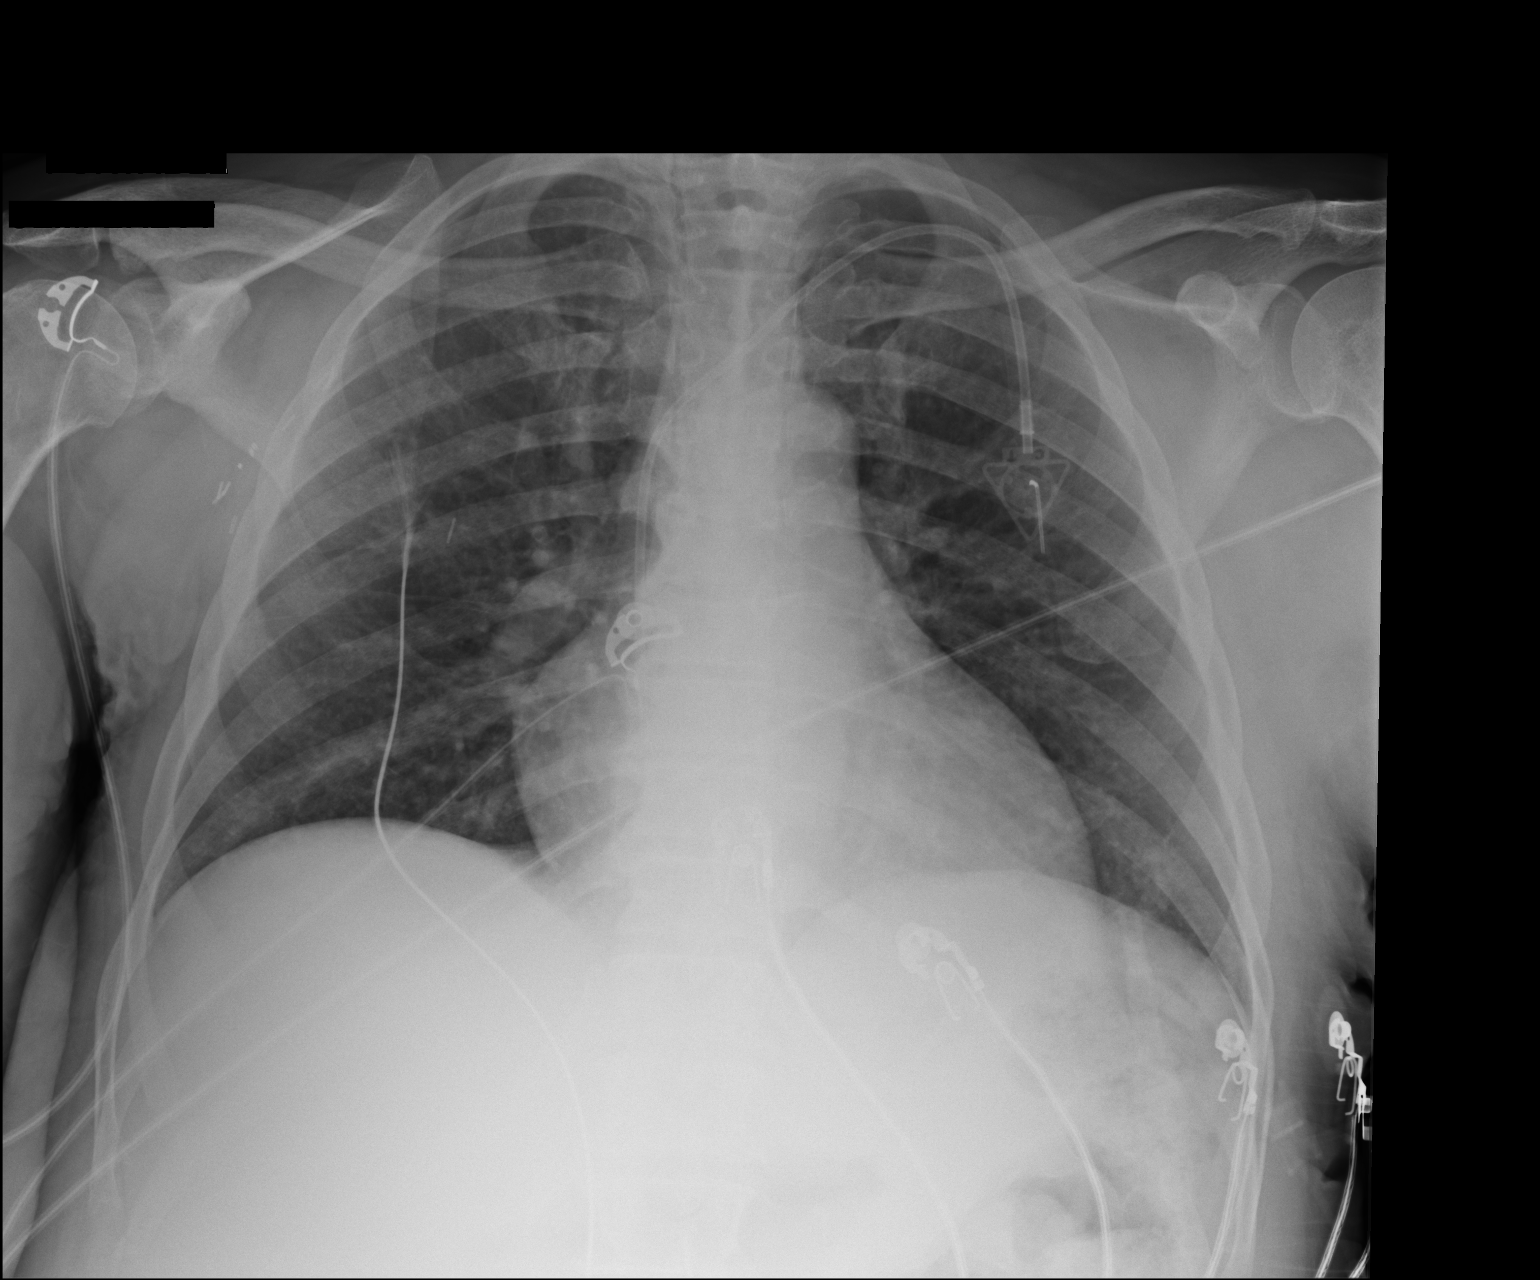

[1 of 1 positions shown; findings below may reference images not displayed]

FINDINGS: Power port type central venous catheter with tip over the cavoatrial
junction region. No pneumothorax. Surgical clips in the right
axilla. Shallow inspiration. Heart size and pulmonary vascularity
are normal. No focal airspace disease or consolidation in the lungs.
No blunting of costophrenic angles. No pneumothorax.
IMPRESSION: No active disease.

## 2016-12-15 ENCOUNTER — Ambulatory Visit: Payer: Commercial Managed Care - PPO

## 2016-12-15 ENCOUNTER — Other Ambulatory Visit: Payer: Commercial Managed Care - PPO

## 2016-12-28 ENCOUNTER — Telehealth: Payer: Self-pay | Admitting: Oncology

## 2016-12-28 NOTE — Telephone Encounter (Signed)
rescheduled lab/inf per 2/20 sch msg and added additional appointments for lab/fu/inf 4 weeks after rescheduled infusion per desk nurse on 2/20. Spoke with patient yesterday (2/20) re lab/inf 2/27 and lab/fu/inf 3/27, however patient wishes to have f/u also in 2/27. Spoke with desk nurse today informing her patient would like f/u 2/27. Per desk nurse she will look into it.

## 2017-01-03 ENCOUNTER — Ambulatory Visit: Payer: Commercial Managed Care - PPO

## 2017-01-03 ENCOUNTER — Other Ambulatory Visit: Payer: Self-pay | Admitting: Oncology

## 2017-01-03 ENCOUNTER — Other Ambulatory Visit (HOSPITAL_BASED_OUTPATIENT_CLINIC_OR_DEPARTMENT_OTHER): Payer: Commercial Managed Care - PPO

## 2017-01-03 ENCOUNTER — Ambulatory Visit (HOSPITAL_BASED_OUTPATIENT_CLINIC_OR_DEPARTMENT_OTHER): Payer: Commercial Managed Care - PPO

## 2017-01-03 VITALS — BP 98/53 | HR 76 | Temp 98.9°F | Resp 18

## 2017-01-03 DIAGNOSIS — C4911 Malignant neoplasm of connective and soft tissue of right upper limb, including shoulder: Secondary | ICD-10-CM

## 2017-01-03 DIAGNOSIS — Z170 Estrogen receptor positive status [ER+]: Secondary | ICD-10-CM

## 2017-01-03 DIAGNOSIS — Z5112 Encounter for antineoplastic immunotherapy: Secondary | ICD-10-CM | POA: Diagnosis not present

## 2017-01-03 DIAGNOSIS — C778 Secondary and unspecified malignant neoplasm of lymph nodes of multiple regions: Secondary | ICD-10-CM | POA: Diagnosis not present

## 2017-01-03 DIAGNOSIS — C50211 Malignant neoplasm of upper-inner quadrant of right female breast: Secondary | ICD-10-CM

## 2017-01-03 DIAGNOSIS — Z5111 Encounter for antineoplastic chemotherapy: Secondary | ICD-10-CM

## 2017-01-03 DIAGNOSIS — Z95828 Presence of other vascular implants and grafts: Secondary | ICD-10-CM

## 2017-01-03 LAB — COMPREHENSIVE METABOLIC PANEL
ALT: 42 U/L (ref 0–55)
AST: 35 U/L — ABNORMAL HIGH (ref 5–34)
Albumin: 3 g/dL — ABNORMAL LOW (ref 3.5–5.0)
Alkaline Phosphatase: 137 U/L (ref 40–150)
Anion Gap: 8 mEq/L (ref 3–11)
BUN: 10.5 mg/dL (ref 7.0–26.0)
CO2: 33 mEq/L — ABNORMAL HIGH (ref 22–29)
Calcium: 10 mg/dL (ref 8.4–10.4)
Chloride: 99 mEq/L (ref 98–109)
Creatinine: 0.8 mg/dL (ref 0.6–1.1)
EGFR: >90 mL/min/{1.73_m2} (ref >90)
Glucose: 110 mg/dl (ref 70–140)
Potassium: 3.8 mEq/L (ref 3.5–5.1)
Sodium: 140 mEq/L (ref 136–145)
Total Bilirubin: 0.29 mg/dL (ref 0.20–1.20)
Total Protein: 7.7 g/dL (ref 6.4–8.3)

## 2017-01-03 LAB — CBC WITH DIFFERENTIAL/PLATELET
BASO%: 0.7 % (ref 0.0–2.0)
Basophils Absolute: 0.1 10*3/uL (ref 0.0–0.1)
EOS%: 1.3 % (ref 0.0–7.0)
Eosinophils Absolute: 0.1 10*3/uL (ref 0.0–0.5)
HCT: 33.2 % — ABNORMAL LOW (ref 34.8–46.6)
HGB: 11 g/dL — ABNORMAL LOW (ref 11.6–15.9)
LYMPH%: 11.7 % — ABNORMAL LOW (ref 14.0–49.7)
MCH: 28.1 pg (ref 25.1–34.0)
MCHC: 33.2 g/dL (ref 31.5–36.0)
MCV: 84.5 fL (ref 79.5–101.0)
MONO#: 1.3 10*3/uL — ABNORMAL HIGH (ref 0.1–0.9)
MONO%: 12.9 % (ref 0.0–14.0)
NEUT#: 7.2 10*3/uL — ABNORMAL HIGH (ref 1.5–6.5)
NEUT%: 73.4 % (ref 38.4–76.8)
Platelets: 340 10*3/uL (ref 145–400)
RBC: 3.93 10*6/uL (ref 3.70–5.45)
RDW: 14.5 % (ref 11.2–14.5)
WBC: 9.8 10*3/uL (ref 3.9–10.3)
lymph#: 1.2 10*3/uL (ref 0.9–3.3)

## 2017-01-03 MED ORDER — ACETAMINOPHEN 325 MG PO TABS
650.0000 mg | ORAL_TABLET | Freq: Once | ORAL | Status: AC
  Administered 2017-01-03: 650 mg via ORAL

## 2017-01-03 MED ORDER — DIPHENHYDRAMINE HCL 25 MG PO CAPS
25.0000 mg | ORAL_CAPSULE | Freq: Once | ORAL | Status: AC
  Administered 2017-01-03: 25 mg via ORAL

## 2017-01-03 MED ORDER — SODIUM CHLORIDE 0.9 % IV SOLN
Freq: Once | INTRAVENOUS | Status: AC
  Administered 2017-01-03: 15:00:00 via INTRAVENOUS

## 2017-01-03 MED ORDER — SODIUM CHLORIDE 0.9 % IJ SOLN
10.0000 mL | INTRAMUSCULAR | Status: DC | PRN
  Administered 2017-01-03: 10 mL via INTRAVENOUS
  Filled 2017-01-03: qty 10

## 2017-01-03 MED ORDER — SODIUM CHLORIDE 0.9 % IV SOLN
6.0000 mg/kg | Freq: Once | INTRAVENOUS | Status: AC
  Administered 2017-01-03: 483 mg via INTRAVENOUS
  Filled 2017-01-03: qty 23

## 2017-01-03 MED ORDER — SODIUM CHLORIDE 0.9% FLUSH
10.0000 mL | INTRAVENOUS | Status: DC | PRN
  Administered 2017-01-03: 10 mL
  Filled 2017-01-03: qty 10

## 2017-01-03 MED ORDER — PACLITAXEL PROTEIN-BOUND CHEMO INJECTION 100 MG
125.0000 mg/m2 | Freq: Once | INTRAVENOUS | Status: AC
  Administered 2017-01-03: 250 mg via INTRAVENOUS
  Filled 2017-01-03: qty 50

## 2017-01-03 MED ORDER — ACETAMINOPHEN 325 MG PO TABS
ORAL_TABLET | ORAL | Status: AC
  Filled 2017-01-03: qty 2

## 2017-01-03 MED ORDER — DIPHENHYDRAMINE HCL 25 MG PO CAPS
ORAL_CAPSULE | ORAL | Status: AC
  Filled 2017-01-03: qty 1

## 2017-01-03 MED ORDER — PROCHLORPERAZINE MALEATE 10 MG PO TABS
10.0000 mg | ORAL_TABLET | Freq: Once | ORAL | Status: AC
  Administered 2017-01-03: 10 mg via ORAL

## 2017-01-03 MED ORDER — PROCHLORPERAZINE MALEATE 10 MG PO TABS
ORAL_TABLET | ORAL | Status: AC
  Filled 2017-01-03: qty 1

## 2017-01-03 MED ORDER — HEPARIN SOD (PORK) LOCK FLUSH 100 UNIT/ML IV SOLN
500.0000 [IU] | Freq: Once | INTRAVENOUS | Status: AC | PRN
  Administered 2017-01-03: 500 [IU]
  Filled 2017-01-03: qty 5

## 2017-01-03 MED ORDER — CIPROFLOXACIN HCL 250 MG PO TABS: 250.0000 mg | ORAL_TABLET | Freq: Two times a day (BID) | ORAL | 0 refills | Status: DC

## 2017-01-03 NOTE — Patient Instructions (Signed)
[facility] Discharge Instructions for Patients Receiving Chemotherapy  Today you received the following chemotherapy agents :  Herceptin and Abraxane.  To help prevent nausea and vomiting after your treatment, we encourage you to take your nausea medication as prescribed.  Take Zofran 8 mg by mouth every 12 hours as needed for nausea.  Can also take Compazine 10 mg by mouth every 6 hours as needed for nausea.  DO NOT Drive after taking Compazine as it can cause drowsiness.  DO NOT Eat  Greasy  Nor  Spicy  Foods.  DO Drink  Lots  Of  Fluids  As  Tolerated.   If you develop nausea and vomiting that is not controlled by your nausea medication, call the clinic.   BELOW ARE SYMPTOMS THAT SHOULD BE REPORTED IMMEDIATELY:  *FEVER GREATER THAN 100.5 F  *CHILLS WITH OR WITHOUT FEVER  NAUSEA AND VOMITING THAT IS NOT CONTROLLED WITH YOUR NAUSEA MEDICATION  *UNUSUAL SHORTNESS OF BREATH  *UNUSUAL BRUISING OR BLEEDING  TENDERNESS IN MOUTH AND THROAT WITH OR WITHOUT PRESENCE OF ULCERS  *URINARY PROBLEMS  *BOWEL PROBLEMS  UNUSUAL RASH Items with * indicate a potential emergency and should be followed up as soon as possible.  Feel free to call the clinic you have any questions or concerns. The clinic phone number is [phone].  Please show the CHEMO ALERT CARD at check-in to the Emergency Department and triage nurse.   

## 2017-01-03 NOTE — Progress Notes (Signed)
Per Dr. Jana Hakim, ok to treat without a recent echocardiogram. She will be scheduled for one in the near future.

## 2017-01-04 ENCOUNTER — Telehealth: Payer: Self-pay | Admitting: Emergency Medicine

## 2017-01-04 ENCOUNTER — Telehealth: Payer: Self-pay | Admitting: *Deleted

## 2017-01-04 ENCOUNTER — Encounter: Payer: Self-pay | Admitting: Adult Health

## 2017-01-04 ENCOUNTER — Ambulatory Visit (HOSPITAL_BASED_OUTPATIENT_CLINIC_OR_DEPARTMENT_OTHER): Payer: Commercial Managed Care - PPO | Admitting: Adult Health

## 2017-01-04 ENCOUNTER — Ambulatory Visit (HOSPITAL_COMMUNITY)
Admission: RE | Admit: 2017-01-04 | Discharge: 2017-01-04 | Disposition: A | Discharge status: Home / Self Care | Payer: Commercial Managed Care - PPO | Source: Ambulatory Visit | Attending: Adult Health | Admitting: Adult Health

## 2017-01-04 ENCOUNTER — Ambulatory Visit: Payer: Commercial Managed Care - PPO

## 2017-01-04 VITALS — BP 96/53 | HR 84 | Temp 98.9°F | Resp 17 | Ht 66.0 in | Wt 164.8 lb

## 2017-01-04 DIAGNOSIS — Z86718 Personal history of other venous thrombosis and embolism: Secondary | ICD-10-CM | POA: Diagnosis not present

## 2017-01-04 DIAGNOSIS — J189 Pneumonia, unspecified organism: Secondary | ICD-10-CM

## 2017-01-04 DIAGNOSIS — R05 Cough: Secondary | ICD-10-CM | POA: Diagnosis not present

## 2017-01-04 DIAGNOSIS — I890 Lymphedema, not elsewhere classified: Secondary | ICD-10-CM | POA: Diagnosis not present

## 2017-01-04 DIAGNOSIS — R509 Fever, unspecified: Secondary | ICD-10-CM | POA: Diagnosis not present

## 2017-01-04 DIAGNOSIS — C50211 Malignant neoplasm of upper-inner quadrant of right female breast: Secondary | ICD-10-CM

## 2017-01-04 DIAGNOSIS — Z170 Estrogen receptor positive status [ER+]: Secondary | ICD-10-CM | POA: Diagnosis not present

## 2017-01-04 DIAGNOSIS — R059 Cough, unspecified: Secondary | ICD-10-CM

## 2017-01-04 DIAGNOSIS — C778 Secondary and unspecified malignant neoplasm of lymph nodes of multiple regions: Secondary | ICD-10-CM | POA: Diagnosis not present

## 2017-01-04 DIAGNOSIS — C4911 Malignant neoplasm of connective and soft tissue of right upper limb, including shoulder: Secondary | ICD-10-CM | POA: Diagnosis not present

## 2017-01-04 DIAGNOSIS — G893 Neoplasm related pain (acute) (chronic): Secondary | ICD-10-CM

## 2017-01-04 MED ORDER — BENZONATATE 200 MG PO CAPS: 200.0000 mg | ORAL_CAPSULE | Freq: Three times a day (TID) | ORAL | 0 refills | Status: DC | PRN

## 2017-01-04 MED ORDER — AZITHROMYCIN 250 MG PO TABS: ORAL_TABLET | ORAL | 0 refills | Status: DC

## 2017-01-04 NOTE — Progress Notes (Signed)
[name]  [phone] [phone]  OFFICE PROGRESS NOTE   ID: [name]   DOB: 05-11-1956  MR#: [id number]  LPF#:[id number]   PCP: [name] Hey, MD SU: [name]. [name], [name]/[name], [name]/[name] Hoxworth MD RAD [id number] [name], [name] OTHER:  [name] Bickers, MD, [name] Fudge MD  CHIEF COMPLAINT:  Stage IV Breast Cancer, angiosarcoma  CURRENT TREATMENT: Abraxane, trastuzumab, pertuzumab  BREAST CANCER HISTORY: From the original intake note:  The patient originally presented in May 2003 when she noticed a lump in the upper inner quadrant of her right breast in September 2003.  She sought attention and had a mammogram which showed an obvious carcinoma in the upper inner quadrant of the right breast, approximately 2 cm.  There were some enlarged lymph nodes in the axilla and an FNA done showed those consistent with malignant cells, most likely an invasive ductal carcinoma.  At that point she was unsure of what to do and was referred by Dr. Marylene [name] for a discussion of her treatment options.  By biopsy, it was ER/PR negative and HER-2 was 3+.  DNA index was 1.42.  We reiterated that it was most important to have her disease surgically addressed at that point and had recommended lumpectomy and axillary nodes to be addressed with which she followed through and on 04-03-02, Dr. Annamaria [name] performed a right partial mastectomy and a right axillary lymph node dissection.  Final pathology revealed a 2.4 cm. high grade, Grade III invasive ductal carcinoma with an adjacent .8 cm. also high grade invasive ductal carcinoma which was felt to represent an intramammary metastasis rather than a second primary.  A smaller mass was just medial to the larger mass.  The smaller mass was associated with high grade DCIS component.  There was no definite lymphovascular invasion identified.  However, one of fourteen axillary lymph nodes did contain a 1.5 cm. metastatic  deposit.  Postoperatively she did very well.  We reiterated the fact that she did need adjuvant chemotherapy, however, she refused and decided that she would pursue radiation.  She received radiation and completed that on 08-14-02. Her subsequent history is as detailed below.   INTERVAL HISTORY: Patient is here today due to cough and fever this morning of 100.1.  The cough has been going on about 2-3 days and she developed pain over her left upper chest wall yesterday.  She is weaker than usual.  She has a cough productive of dark sputum, she has pleuritic chest pain.  She denies shortness of breath, swelling in her legs, palpitations, and orthopnea.    REVIEW OF SYSTEMS: Patient denies chills, nasal drainage, abdominal pain, nausea, vomiting, constipation, diarrhea.   Detailed ROS otherwise negative.   PAST MEDICAL HISTORY: Past Medical History:  Diagnosis Date  . Breast cancer ([place])   . DVT (deep venous thrombosis) ([place]) 10/07/2011  . Hypertension   . Radiation 11/29/2005-12/29/2005   right supraclavicular area 4147 cGy  . Radiation 06/26/02-08/14/02   right breast 5040 cGy, tumor bed boosted to 1260 cGy  . Rheumatic fever     PAST SURGICAL HISTORY: Past Surgical History:  Procedure Laterality Date  . BREAST SURGERY    . MASTECTOMY     bilateral  . PORT A CATH REVISION      FAMILY HISTORY Family History  Problem Relation Age of Onset  . Pancreatic cancer Mother   . Kidney cancer Sister 80  . Breast cancer Sister 16  . Breast cancer Other 33  The  patient's father died in his 28M  from complications of alcohol abuse. The patient's mother died in her 58s from pancreatic cancer. The patient has 6 brothers, 2 sisters. Both her sisters have had breast cancer, both diagnosed after the age of 32. The patient has not had genetic testing so far.   GYNECOLOGIC HISTORY: Menarche age 6; first live birth age 66. She is GXP4. She is not sure when she stopped having periods. She never used  hormone replacement.  SOCIAL HISTORY: The patient has worked in the past as a Psychologist, counselling. At home in addition to the patient are her husband [name], originally from [place], who works as a Administrator.  Also living in the home is her son [name] and her daughter [name] (she is studying to be a Marine scientist).  The other 2 children are [name], who has a college degree in psychology and works as a Probation officer; and [name], who lives in [place] and works as a Higher education careers adviser.   ADVANCED DIRECTIVES: Not in place  HEALTH MAINTENANCE: Social History  Substance Use Topics  . Smoking status: Light Tobacco Smoker  . Smokeless tobacco: Never Used  . Alcohol use Yes     Comment: Rarely     Colonoscopy:  Not on file  PAP:  Not on file  Bone density:  Not on file  Lipid panel:  Not on file    Allergies  Allergen Reactions  . Pork-Derived Products   . Tramadol Nausea Only  . Hydrocodone-Acetaminophen Itching and Rash    Current Outpatient Prescriptions  Medication Sig Dispense Refill  . alprazolam (XANAX) 2 MG tablet Take 2 mg by mouth daily.     . Ascorbic Acid (VITAMIN C) 100 MG tablet Take 100 mg by mouth daily.      Marland Kitchen azithromycin (ZITHROMAX) 250 MG tablet 2 tab on day 1 then 1 tab daily until complete 6 each 0  . benzonatate (TESSALON) 200 MG capsule Take 1 capsule (200 mg total) by mouth 3 (three) times daily as needed for cough. 20 capsule 0  . calcium-vitamin D (OSCAL WITH D) 500-200 MG-UNIT per tablet Take 1 tablet by mouth daily.      . ciprofloxacin (CIPRO) 250 MG tablet Take 1 tablet (250 mg total) by mouth 2 (two) times daily. 10 tablet 0  . diphenoxylate-atropine (LOMOTIL) 2.5-0.025 MG tablet Take 1 tablet by mouth 4 (four) times daily as needed for diarrhea or loose stools. (Patient not taking: Reported on 12/01/2016) 60 tablet 1  . gabapentin (NEURONTIN) 300 MG capsule Take 1 capsule (300 mg total) by mouth at bedtime. 90 capsule 4  . methadone (DOLOPHINE) 10 MG tablet Take 10 mg  by mouth every 8 (eight) hours. Take with 87m tablet to make 150mtotal every 8 hours.    . methadone (DOLOPHINE) 5 MG tablet Take 5 mg by mouth every 8 (eight) hours. Take this along with 1037mo make 23m13mtal every 8 hours.  0  . Multiple Vitamin (MULTIVITAMIN) capsule Take 1 capsule by mouth daily.      . naproxen sodium (ANAPROX) 220 MG tablet Take 440 mg by mouth 2 (two) times daily with a meal.    . ondansetron (ZOFRAN) 8 MG tablet TAKE ONE TABLET BY MOUTH TWICE DAILY AS NEEDED FOR NAUSEA AND VOMITING (Patient not taking: Reported on 12/01/2016) 30 tablet 1  . oxyCODONE-acetaminophen (PERCOCET) 10-325 MG tablet Take 1 tablet by mouth every 8 (eight) hours as needed for pain. 45 tablet 0  . oxymorphone (  OPANA ER) 15 MG 12 hr tablet Take 1 tablet (15 mg total) by mouth every 12 (twelve) hours. 30 tablet 0  . predniSONE (DELTASONE) 5 MG tablet Take 2 tablets (10 mg total) by mouth daily with breakfast. (Patient not taking: Reported on 12/01/2016) 60 tablet 6  . prochlorperazine (COMPAZINE) 10 MG tablet Take 1 tablet (10 mg total) by mouth every 6 (six) hours as needed (Nausea or vomiting). 30 tablet 1  . sertraline (ZOLOFT) 50 MG tablet Take 1 tablet (50 mg total) by mouth daily. 30 tablet 3  . triamterene-hydrochlorothiazide (DYAZIDE) 37.5-25 MG capsule Take 1 each (1 capsule total) by mouth every morning. 30 capsule 1  . valACYclovir (VALTREX) 500 MG tablet Take 1 tablet (500 mg total) by mouth 2 (two) times daily. (Patient not taking: Reported on 12/01/2016) 30 tablet 12   No current facility-administered medications for this visit.    Facility-Administered Medications Ordered in Other Visits  Medication Dose Route Frequency Provider Last Rate Last Dose  . sodium chloride 0.9 % injection 10 mL  10 mL Intravenous PRN Chauncey Cruel, MD   10 mL at 03/04/14 1421    OBJECTIVE: Middle-aged [place] American woman Who appears stated age  61:   01/04/17 1141  BP: (!) 96/53  Pulse: 84   Resp: 17  Temp: 98.9 F (37.2 C)     Body mass index is 26.6 kg/m.    ECOG FS: 2 Filed Weights   01/04/17 1141  Weight: 164 lb 12.8 oz (74.8 kg)   GENERAL: Patient is a well appearing woman sitting in a wheelchair HEENT:  Sclerae anicteric.  Oropharynx clear and moist. No ulcerations or evidence of oropharyngeal candidiasis. Neck is supple.  NODES:  No cervical, supraclavicular, or axillary lymphadenopathy palpated.  BREAST EXAM:  Deferred. LUNGS:  Clear to auscultation bilaterally.  No wheezes or rhonchi. HEART:  Regular rate and rhythm. No murmur appreciated. ABDOMEN:  Soft, nontender.  Positive, normoactive bowel sounds. No organomegaly palpated. MSK:  No focal spinal tenderness to palpation. Full range of motion bilaterally in the upper extremities. EXTREMITIES:  No peripheral edema.   SKIN:  Clear with no obvious rashes or skin changes. No nail dyscrasia. NEURO:  Nonfocal. Well oriented.  Appropriate affect.            LAB RESULTS:.  Lab Results  Component Value Date   WBC 9.8 01/03/2017   NEUTROABS 7.2 (H) 01/03/2017   HGB 11.0 (L) 01/03/2017   HCT 33.2 (L) 01/03/2017   MCV 84.5 01/03/2017   PLT 340 01/03/2017      Chemistry      Component Value Date/Time   NA 140 01/03/2017 1136   K 3.8 01/03/2017 1136   CL 99 (L) 12/01/2016 2305   CL 101 03/07/2013 1411   CO2 33 (H) 01/03/2017 1136   BUN 10.5 01/03/2017 1136   CREATININE 0.8 01/03/2017 1136      Component Value Date/Time   CALCIUM 10.0 01/03/2017 1136   ALKPHOS 137 01/03/2017 1136   AST 35 (H) 01/03/2017 1136   ALT 42 01/03/2017 1136   BILITOT 0.29 01/03/2017 1136       STUDIES: Dg Chest 2 View  Result Date: 01/04/2017 CLINICAL DATA:  Chemotherapy yesterday, fever, cough EXAM: CHEST  2 VIEW COMPARISON:  12/01/2016 FINDINGS: There is left upper lobe airspace disease most concerning for pneumonia. There is no pleural effusion or pneumothorax. The heart and mediastinal contours are  unremarkable. There is a left-sided Port-A-Cath with the tip  projecting over the SVC. The osseous structures are unremarkable. IMPRESSION: Left upper lobe pneumonia. Followup PA and lateral chest X-ray is recommended in 3-4 weeks following trial of antibiotic therapy to ensure resolution and exclude underlying malignancy. Electronically Signed   By: Kathreen Devoid   On: 01/04/2017 12:17     ASSESSMENT:  61 y.o. [place] woman with stage IV breast cancer manifested chiefly by loco-regional nodal disease (neck, chest), without lung, liver, bone, or brain involvement.  (1) Status post right upper inner quadrant lumpectomy and sentinel lymph node sampling 03/04/2002 for 2 separate foci of invasive ductal carcinoma, mpT2 pN1 or stage IIB, both foci grade 3, both estrogen and progesterone receptor positive, both HercepTest 3+, Mib-1 56%  (2) Reexcision for margins 05/27/2002 showed no residual cancer in the breast.  (3) The patient refused adjuvant systemic therapy.  (4) Adjuvant radiation treatment completed 08/14/2002.  (5) recurrence in the right breast in 02/2004 showing a morphologically different tumor, again grade 3, again estrogen and progesterone receptor negative, with an MIB-1 of 14% and Herceptest 3+.  (5) Between 03/2004 and 07/2004 she received dose dense Doxorubicin/Cyclophosphamide x 4 given with trastuzumab, followed by weekly Docetaxel x 8, again given with trastuzumab.  (6) Right mastectomy 07/13/2004 showed scattered microscopic foci of residual disease over an area  greater than 5 cm. Margins were negative.  (7) Postoperative Docetaxel continued until 09/2004.  (8) Trastuzumab (Herceptin) given 08/2004 through 01/2012 with some brief interruptions.  (9) Isolated right cervical nodal recurrence 10/2005, treated with radiation to the right supraclavicular area (total 41.5 gray) completed 12/29/2005.  (10) Navelbine given together with Herceptin  11/2005 through 03/2006.  (9)  Left mastectomy 02/13/2006 for ductal carcinoma in situ, grade 2, estrogen and progesterone receptor negative, with negative margins; 0 of 3 lymph nodes involved  (10) treated with Lapatinib and Capecitabine before 10/2009, for an unclear duration and with unclear results (cannot locate data on chart review).  (11) Status post right supraclavicular lymph node biopsy 09/2010 again positive for an invasive ductal carcinoma, estrogen and progesterone receptor negative, HER-2 positive by C[facility] with a ratio 4.25.  (12) Navelbine given together with Herceptin between 05/2011 and 11/2011.  (13) Carboplatin/ Gemcitabine/ Herceptin given for 2 cycles, in 12/2011 and 01/2012.  (14) TDM-1 ([place]) started 02/2012. Last dose 10/02/2013 after which the patient discontinued treatment at her own discretion. Echo on December 2014 showed a well preserved ejection fraction.  (15) Deep vein thrombosis of the right upper extremity documented 04/20/2011.  She completed anticoagulant therapy with Coumadin on 03/25/2013.  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 Left chest Port-A-Cath placed on 04/08/2013; being flushed every 6 weeks  RIGHT UPPER EXTREMITY ANGIOSARCOMA VS BREAST CANCER: (18) treated at [facility] [place] August 2016 with paclitaxel, trastuzumab and pertuzumab 1 cycle, afterwards referred to hospice  (19) under the care of hospice of [facility] August 2016 to 05/08/2016, when the patient opted for a second try at chemotherapy  (20) started low-dose Abraxane, trastuzumab and pertuzumab 06/07/2016, to be repeated every 3 weeks.   (a) pretreatment echocardiogram 06/06/2016 showed a 60-65% ejection fraction  (b) significant compliance problems compromise  response to treatment  (21) Chronic pain secondary to known metastatic disease  (a) patient signed pain contract 10/19/2016  (b) as of 11/16/2016 to receive  her pain medication on every 14 dayschedule    PLAN:  Chest xray was done and revealed a left upper lobe pneumonia. I reviewed the fact that Mrs. Colaizzi has pneumonia with Dr. Jana Hakim, and he wants her to start Zpak only.  He also recommended she return for repeat chest xray in 1-2 weeks.  Patient wanted to know what she could take for the pain.  I informed her that the antibiotics should start to clear things up.  Zpak was sent into pharmacy along with Jory Ee (patient on multiple narcotics for pain and has more than one prescriber previously--therefore I do not feel comfortable prescribing any cough medication with a controlled substance in it, especially without her trying a non narcotic cough medication first). Should patient worsen at all clinically she needs to go to ER and I reviewed this with her and her daughter and they verbalized understanding with this.     A total of (30) minutes of face-to-face time was spent with this patient with greater than 50% of that time in counseling and care-coordination.   Charlestine Massed, NP 01/04/2017 2:04 PM

## 2017-01-04 NOTE — Telephone Encounter (Signed)
"  I was seen today.  Found out I have pneumonia.  I was prescribed a Z-pack.  Could someone call Dr. [name] to give permission for him to write order for a liquid cough syrup?" A.P.P. Notified of this call. Advised Nichola that [employer] pearls were ordered.  This yellow clear pill is filled with liquid to help her cough in addition to the Z- pack.  If PCP needs to speak with a provider he may call the office.  No further questions or needs at this time.

## 2017-01-04 NOTE — Telephone Encounter (Signed)
Patient's daughter called with complaints of patient "feeling hot" and patient reported chest pain. Per patient's daughter; Ms Hagedorn's temp in 100.1 this am. Instructed patient's daughter to bring Ms Chalmers in this am to be seen by Wyvonnia Lora NP. Patient given chemo; Abraxane and Herceptin on 01/03/17.

## 2017-01-09 ENCOUNTER — Telehealth: Payer: Self-pay | Admitting: *Deleted

## 2017-01-09 NOTE — Telephone Encounter (Signed)
This RN spoke with pt per her call concerning for " still not better after taking the antibiotic "  Per further discussion- [name] states symptom improvement with coughing controlled with tessalon perles as well as " cold coming out but just not feeling better ".  She denies any fevers.  This RN discussed above with pt stating need to continue use of medications to control symptoms, push fluids, and allow her time to recover which may take up to several weeks.  [name] will call this RN on Thursday with update for follow up Xray now or week of March 19th dependent upon symptoms.

## 2017-01-17 ENCOUNTER — Other Ambulatory Visit: Payer: Self-pay | Admitting: Oncology

## 2017-01-17 DIAGNOSIS — C50211 Malignant neoplasm of upper-inner quadrant of right female breast: Secondary | ICD-10-CM

## 2017-01-17 DIAGNOSIS — C4911 Malignant neoplasm of connective and soft tissue of right upper limb, including shoulder: Secondary | ICD-10-CM

## 2017-01-18 ENCOUNTER — Other Ambulatory Visit: Payer: Self-pay | Admitting: Oncology

## 2017-01-21 ENCOUNTER — Emergency Department (HOSPITAL_COMMUNITY): Payer: Commercial Managed Care - PPO

## 2017-01-21 ENCOUNTER — Emergency Department (HOSPITAL_COMMUNITY)
Admission: EM | Admit: 2017-01-21 | Discharge: 2017-01-21 | Disposition: A | Discharge status: Home / Self Care | Payer: Commercial Managed Care - PPO | Attending: Emergency Medicine | Admitting: Emergency Medicine

## 2017-01-21 ENCOUNTER — Encounter (HOSPITAL_COMMUNITY): Payer: Self-pay | Admitting: Emergency Medicine

## 2017-01-21 DIAGNOSIS — R0602 Shortness of breath: Secondary | ICD-10-CM | POA: Insufficient documentation

## 2017-01-21 DIAGNOSIS — J181 Lobar pneumonia, unspecified organism: Secondary | ICD-10-CM | POA: Diagnosis not present

## 2017-01-21 DIAGNOSIS — Z853 Personal history of malignant neoplasm of breast: Secondary | ICD-10-CM | POA: Insufficient documentation

## 2017-01-21 DIAGNOSIS — I10 Essential (primary) hypertension: Secondary | ICD-10-CM | POA: Diagnosis not present

## 2017-01-21 DIAGNOSIS — F17200 Nicotine dependence, unspecified, uncomplicated: Secondary | ICD-10-CM | POA: Diagnosis not present

## 2017-01-21 DIAGNOSIS — Z79899 Other long term (current) drug therapy: Secondary | ICD-10-CM | POA: Diagnosis not present

## 2017-01-21 DIAGNOSIS — J189 Pneumonia, unspecified organism: Secondary | ICD-10-CM

## 2017-01-21 DIAGNOSIS — E860 Dehydration: Secondary | ICD-10-CM | POA: Diagnosis not present

## 2017-01-21 DIAGNOSIS — R05 Cough: Secondary | ICD-10-CM | POA: Diagnosis present

## 2017-01-21 LAB — D-DIMER, QUANTITATIVE: D-Dimer, Quant: 5.19 ug/mL-FEU — ABNORMAL HIGH (ref 0.00–0.50)

## 2017-01-21 LAB — I-STAT CG4 LACTIC ACID, ED: Lactic Acid, Venous: 0.67 mmol/L (ref 0.5–1.9)

## 2017-01-21 LAB — BRAIN NATRIURETIC PEPTIDE: B Natriuretic Peptide: 16.1 pg/mL (ref 0.0–100.0)

## 2017-01-21 LAB — COMPREHENSIVE METABOLIC PANEL
ALT: 10 U/L — ABNORMAL LOW (ref 14–54)
AST: 20 U/L (ref 15–41)
Albumin: 3.5 g/dL (ref 3.5–5.0)
Alkaline Phosphatase: 84 U/L (ref 38–126)
Anion gap: 9 (ref 5–15)
BUN: 15 mg/dL (ref 6–20)
CO2: 29 mmol/L (ref 22–32)
Calcium: 9.3 mg/dL (ref 8.9–10.3)
Chloride: 101 mmol/L (ref 101–111)
Creatinine, Ser: 0.91 mg/dL (ref 0.44–1.00)
GFR calc Af Amer: >60 mL/min (ref >60)
GFR calc non Af Amer: >60 mL/min (ref >60)
Glucose, Bld: 94 mg/dL (ref 65–99)
Potassium: 3.6 mmol/L (ref 3.5–5.1)
Sodium: 139 mmol/L (ref 135–145)
Total Bilirubin: 0.5 mg/dL (ref 0.3–1.2)
Total Protein: 8.1 g/dL (ref 6.5–8.1)

## 2017-01-21 LAB — CBC WITH DIFFERENTIAL/PLATELET
Basophils Absolute: 0.1 10*3/uL (ref 0.0–0.1)
Basophils Relative: 1 %
Eosinophils Absolute: 0 10*3/uL (ref 0.0–0.7)
Eosinophils Relative: 0 %
HCT: 33.8 % — ABNORMAL LOW (ref 36.0–46.0)
Hemoglobin: 10.7 g/dL — ABNORMAL LOW (ref 12.0–15.0)
Lymphocytes Relative: 12 %
Lymphs Abs: 1.2 10*3/uL (ref 0.7–4.0)
MCH: 26.8 pg (ref 26.0–34.0)
MCHC: 31.7 g/dL (ref 30.0–36.0)
MCV: 84.5 fL (ref 78.0–100.0)
Monocytes Absolute: 0.9 10*3/uL (ref 0.1–1.0)
Monocytes Relative: 9 %
Neutro Abs: 8 10*3/uL — ABNORMAL HIGH (ref 1.7–7.7)
Neutrophils Relative %: 78 %
Platelets: 333 10*3/uL (ref 150–400)
RBC: 4 MIL/uL (ref 3.87–5.11)
RDW: 15 % (ref 11.5–15.5)
WBC: 10.3 10*3/uL (ref 4.0–10.5)

## 2017-01-21 LAB — I-STAT TROPONIN, ED: Troponin i, poc: 0 ng/mL (ref 0.00–0.08)

## 2017-01-21 MED ORDER — IOPAMIDOL (ISOVUE-370) INJECTION 76%
INTRAVENOUS | Status: AC
  Administered 2017-01-21: 65 mL via INTRAVENOUS
  Filled 2017-01-21: qty 100

## 2017-01-21 MED ORDER — NICOTINE 21 MG/24HR TD PT24: 21.0000 mg | MEDICATED_PATCH | Freq: Once | TRANSDERMAL | Status: DC

## 2017-01-21 MED ORDER — HYDROMORPHONE HCL 1 MG/ML IJ SOLN
1.0000 mg | Freq: Once | INTRAMUSCULAR | Status: AC
  Administered 2017-01-21: 1 mg via INTRAVENOUS
  Filled 2017-01-21: qty 1

## 2017-01-21 MED ORDER — DOXYCYCLINE HYCLATE 100 MG PO CAPS: 100.0000 mg | ORAL_CAPSULE | Freq: Two times a day (BID) | ORAL | 0 refills | Status: DC

## 2017-01-21 MED ORDER — AMOXICILLIN-POT CLAVULANATE 875-125 MG PO TABS: 1.0000 | ORAL_TABLET | Freq: Two times a day (BID) | ORAL | 0 refills | Status: DC

## 2017-01-21 MED ORDER — SODIUM CHLORIDE 0.9 % IV BOLUS (SEPSIS)
1000.0000 mL | Freq: Once | INTRAVENOUS | Status: AC
  Administered 2017-01-21: 1000 mL via INTRAVENOUS

## 2017-01-21 MED ORDER — HEPARIN SOD (PORK) LOCK FLUSH 100 UNIT/ML IV SOLN
500.0000 [IU] | Freq: Once | INTRAVENOUS | Status: AC
  Administered 2017-01-21: 500 [IU]
  Filled 2017-01-21: qty 5

## 2017-01-21 MED ORDER — HYDROMORPHONE HCL 1 MG/ML IJ SOLN
0.5000 mg | Freq: Once | INTRAMUSCULAR | Status: AC
  Administered 2017-01-21: 0.5 mg via INTRAVENOUS
  Filled 2017-01-21: qty 0.5

## 2017-01-21 MED ORDER — LEVOFLOXACIN 750 MG PO TABS
750.0000 mg | ORAL_TABLET | Freq: Once | ORAL | Status: AC
  Administered 2017-01-21: 750 mg via ORAL
  Filled 2017-01-21: qty 1

## 2017-01-21 MED ORDER — DEXTROSE 5 % IV SOLN
2.0000 g | Freq: Once | INTRAVENOUS | Status: AC
  Administered 2017-01-21: 2 g via INTRAVENOUS
  Filled 2017-01-21: qty 2

## 2017-01-21 MED ORDER — HYDROMORPHONE HCL 1 MG/ML IJ SOLN: 1.0000 mg | Freq: Once | INTRAMUSCULAR | Status: DC

## 2017-01-21 NOTE — ED Provider Notes (Signed)
[facility] DEPT Provider Note   CSN: [id number] Arrival date & time: 01/21/17  1750     History   Chief Complaint Chief Complaint  Patient presents with  . Cough    HPI [name] is a 61 y.o. female.  HPI 61 year old F with PMHx of HTN, DVT, breast CA on chemo here with cough, CP. Pt was diagnosed with PNA 2 weeks ago, started on Z-Pack. She states that since then, she has had cough, fevers, chills, and nausea. Over the past 24 hours, she has developed an aching, throbbing, severe left chest pain that is worse with coughing. She does not have associated SOB. She called her oncologist who sent her here. She has associated yellow-green sputum production, general fatigue. Her BP has also been running lower than usual. Denies specific alleviating or aggravating factors beyond coughing. Pain is not positional.  Past Medical History:  Diagnosis Date  . Breast cancer ([place])   . DVT (deep venous thrombosis) ([place]) 10/07/2011  . Hypertension   . Radiation 11/29/2005-12/29/2005   right supraclavicular area 4147 cGy  . Radiation 06/26/02-08/14/02   right breast 5040 cGy, tumor bed boosted to 1260 cGy  . Rheumatic fever     Patient Active Problem List   Diagnosis Date Noted  . Port catheter in place 05/30/2016  . 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 Chronic pain 04/13/2015  . Left arm pain 08/18/2014  . Breast cancer of upper-inner quadrant of right female breast ([place]) 10/03/2013  . Post-lymphadenectomy lymphedema of arm 08/15/2013  . Port or reservoir infection 01/29/2013  . DVT (deep venous thrombosis) ([place]) 10/07/2011  . Chest pain 09/10/2009  . Secondary cardiomyopathy ([place]) 09/02/2009  . Essential hypertension 02/26/2009  . GERD 02/26/2009    Past Surgical History:  Procedure Laterality Date  . BREAST SURGERY    . MASTECTOMY     bilateral  . PORT A CATH REVISION      OB History    No data available       Home Medications    Prior  to Admission medications   Medication Sig Start Date End Date Taking? Authorizing Provider  alprazolam Duanne Moron) 2 MG tablet Take 2 mg by mouth daily.  03/15/13  Yes Historical Provider, MD  Ascorbic Acid (VITAMIN C) 100 MG tablet Take 100 mg by mouth daily.     Yes Historical Provider, MD  calcium-vitamin D (OSCAL WITH D) 500-200 MG-UNIT per tablet Take 1 tablet by mouth daily.     Yes Historical Provider, MD  Multiple Vitamin (MULTIVITAMIN) capsule Take 1 capsule by mouth daily.     Yes Historical Provider, MD  oxyCODONE-acetaminophen (PERCOCET) 10-325 MG tablet Take 1 tablet by mouth every 8 (eight) hours as needed for pain. 11/16/16  Yes Chauncey Cruel, MD  sertraline (ZOLOFT) 50 MG tablet Take 1 tablet (50 mg total) by mouth daily. 11/14/16  Yes Chauncey Cruel, MD  triamterene-hydrochlorothiazide (DYAZIDE) 37.5-25 MG capsule Take 1 each (1 capsule total) by mouth every morning. 01/18/17  Yes Chauncey Cruel, MD  amoxicillin-clavulanate (AUGMENTIN) 875-125 MG tablet Take 1 tablet by mouth every 12 (twelve) hours. 01/21/17 01/31/17  Duffy Bruce, MD  azithromycin (ZITHROMAX) 250 MG tablet 2 tab on day 1 then 1 tab daily until complete Patient not taking: Reported on 01/21/2017 01/04/17   Gardenia Phlegm, NP  benzonatate (TESSALON) 200 MG capsule Take 1 capsule (200 mg total) by mouth 3 (three) times daily as needed for cough. Patient not  taking: Reported on 01/21/2017 01/04/17   Gardenia Phlegm, NP  ciprofloxacin (CIPRO) 250 MG tablet Take 1 tablet (250 mg total) by mouth 2 (two) times daily. Patient not taking: Reported on 01/21/2017 01/03/17   Chauncey Cruel, MD  diphenoxylate-atropine (LOMOTIL) 2.5-0.025 MG tablet Take 1 tablet by mouth 4 (four) times daily as needed for diarrhea or loose stools. Patient not taking: Reported on 12/01/2016 06/30/16   Chauncey Cruel, MD  doxycycline (VIBRAMYCIN) 100 MG capsule Take 1 capsule (100 mg total) by mouth 2 (two) times daily. 01/21/17  01/31/17  Duffy Bruce, MD  gabapentin (NEURONTIN) 300 MG capsule Take 1 capsule (300 mg total) by mouth at bedtime. Patient not taking: Reported on 01/21/2017 03/20/15   Chauncey Cruel, MD  ondansetron (ZOFRAN) 8 MG tablet TAKE ONE TABLET BY MOUTH TWICE DAILY AS NEEDED FOR NAUSEA AND VOMITING Patient not taking: Reported on 12/01/2016 06/20/16   Chauncey Cruel, MD  oxymorphone (OPANA ER) 15 MG 12 hr tablet Take 1 tablet (15 mg total) by mouth every 12 (twelve) hours. Patient not taking: Reported on 01/21/2017 11/16/16   Chauncey Cruel, MD  prochlorperazine (COMPAZINE) 10 MG tablet Take 1 tablet (10 mg total) by mouth every 6 (six) hours as needed (Nausea or vomiting). 01/17/17   Chauncey Cruel, MD  valACYclovir (VALTREX) 500 MG tablet Take 1 tablet (500 mg total) by mouth 2 (two) times daily. Patient not taking: Reported on 12/01/2016 07/19/16   Chauncey Cruel, MD    Family History Family History  Problem Relation Age of Onset  . Pancreatic cancer Mother   . Kidney cancer Sister 70  . Breast cancer Sister 68  . Breast cancer Other 39    Social History Social History  Substance Use Topics  . Smoking status: Light Tobacco Smoker  . Smokeless tobacco: Never Used  . Alcohol use Yes     Comment: Rarely     Allergies   Pork-derived products; Tramadol; and Hydrocodone-acetaminophen   Review of Systems Review of Systems  Constitutional: Positive for chills and fatigue. Negative for fever.  HENT: Negative for congestion, rhinorrhea and sore throat.   Eyes: Negative for visual disturbance.  Respiratory: Positive for cough and shortness of breath. Negative for wheezing.   Cardiovascular: Positive for chest pain. Negative for leg swelling.  Gastrointestinal: Negative for abdominal pain, diarrhea, nausea and vomiting.  Genitourinary: Negative for dysuria, flank pain, vaginal bleeding and vaginal discharge.  Musculoskeletal: Negative for neck pain.  Skin: Negative for rash.    Allergic/Immunologic: Negative for immunocompromised state.  Neurological: Positive for weakness. Negative for syncope and headaches.  Hematological: Does not bruise/bleed easily.  All other systems reviewed and are negative.    Physical Exam Updated Vital Signs BP 97/65 (BP Location: Left Arm)   Pulse 72   Temp 98.7 F (37.1 C) (Oral)   Resp 14   Ht 5\' 5"  (1.651 m)   Wt 164 lb (74.4 kg)   SpO2 100%   BMI 27.29 kg/m   Physical Exam  Constitutional: She is oriented to person, place, and time. She appears well-developed and well-nourished. No distress.  HENT:  Head: Normocephalic and atraumatic.  Dry MM  Eyes: Conjunctivae are normal.  Neck: Neck supple.  Cardiovascular: Normal rate, regular rhythm and normal heart sounds.  Exam reveals no friction rub.   No murmur heard. Pulmonary/Chest: Effort normal and breath sounds normal. No respiratory distress. She has no wheezes. She has no rales.  Abdominal: She exhibits  no distension.  Musculoskeletal: She exhibits no edema.  No calf TTP or asymmetry bilaterally  Neurological: She is alert and oriented to person, place, and time. She exhibits normal muscle tone.  Skin: Skin is warm. Capillary refill takes less than 2 seconds.  Psychiatric: She has a normal mood and affect.  Nursing note and vitals reviewed.    ED Treatments / Results  Labs (all labs ordered are listed, but only abnormal results are displayed) Labs Reviewed  CBC WITH DIFFERENTIAL/PLATELET - Abnormal; Notable for the following:       Result Value   Hemoglobin 10.7 (*)    HCT 33.8 (*)    Neutro Abs 8.0 (*)    All other components within normal limits  COMPREHENSIVE METABOLIC PANEL - Abnormal; Notable for the following:    ALT 10 (*)    All other components within normal limits  D-DIMER, QUANTITATIVE (NOT AT [facility]) - Abnormal; Notable for the following:    D-Dimer, Quant 5.19 (*)    All other components within normal limits  BRAIN NATRIURETIC PEPTIDE   I-STAT CG4 LACTIC ACID, ED  Randolm Idol, ED    EKG  EKG Interpretation None       Radiology Dg Chest 2 View  Result Date: 01/21/2017 CLINICAL DATA:  Cough.  Diagnosis with pneumonia on February 28. EXAM: CHEST  2 VIEW COMPARISON:  01/04/2017 FINDINGS: Injectable catheter in stable position with tip overlying the expected location of the cavoatrial junction. Cardiomediastinal silhouette is normal. Mediastinal contours appear intact. There is no evidence of pneumothorax. There is persistent opacity within the perifissural portion of the left upper lobe with associated fissure wall thickening. There is also prominence of the left hilum. Osseous structures are without acute abnormality. Postsurgical changes in the right chest wall. Soft tissues are grossly normal. IMPRESSION: Persistent opacity within the inferior portion of the left upper lobe with associated perifissural thickening. Left perihilar thickening. This may represent an area of airspace consolidation, a pseudo tumor or a pulmonary mass. Further evaluation with chest CT may be considered. Electronically Signed   By: Fidela Salisbury [name]   On: 01/21/2017 18:54   Ct Angio Chest Pe W And/or Wo Contrast  Result Date: 01/21/2017 CLINICAL DATA:  Cough. Diagnosis of pneumonia on 01/04/2017. No relief on medications. Progressive shortness of breath. EXAM: CT ANGIOGRAPHY CHEST WITH CONTRAST TECHNIQUE: Multidetector CT imaging of the chest was performed using the standard protocol during bolus administration of intravenous contrast. Multiplanar CT image reconstructions and MIPs were obtained to evaluate the vascular anatomy. CONTRAST:  65 mL Isovue 370 COMPARISON:  03/19/2015 FINDINGS: Cardiovascular: Satisfactory opacification of the pulmonary arteries to the segmental level. No evidence of pulmonary embolism. Normal heart size. No pericardial effusion. Normal caliber thoracic aorta. No aortic dissection. Great vessel origins are patent.  Scattered calcifications in the aorta. Coronary artery calcifications. Mediastinum/Nodes: No enlarged mediastinal, hilar, or axillary lymph nodes. Thyroid gland, trachea, and esophagus demonstrate no significant findings. Left-sided Infuse-A-Port with tip in the distal SVC. Lungs/Pleura: Small left pleural effusion with left basilar atelectasis or consolidation. Fluid in the left major fissure. Focal consolidation in the left lingula. No obstructing lesions are present. This likely represents pneumonia. Focal fibrosis demonstrated in the anterior right lung is probably postradiation change. Right lung is otherwise clear. 5 mm nodule in the right lung base is unchanged since previous study. 3 mm nodule seen previously in the central left lung is not identified due to consolidation in this area today. No new pulmonary nodules  are demonstrated. Upper Abdomen: No acute abnormality. Musculoskeletal: Diffuse degenerative change throughout the thoracic spine. No destructive bone lesions. Postoperative bilateral mastectomies. Large subcutaneous soft tissue nodules in the subcutaneous fat over the back, mostly on the right side. These lesions have developed since the previous study. Largest is in the upper back to the right of midline and measures about 2.9 x 4.9 cm. Multiple additional smaller lesions are demonstrated. Soft tissue nodules also demonstrated in the left axilla measuring up to 2 cm diameter, new since prior study. Appearances are suspicious for developing metastatic disease, either in lymph nodes and/or skin nodules. Review of the MIP images confirms the above findings. IMPRESSION: No evidence of significant pulmonary embolus. Consolidation in the left lung with small left pleural effusion, likely due to pneumonia. Radiation change in the anterior right lung. Interval development of multiple soft tissue nodules in the subcutaneous tissues over the right side of the back and in the left axilla. These changes  are worrisome for metastatic disease either from soft tissue nodules or/and lymph nodes. Electronically Signed   By: Lucienne Capers [name]   On: 01/21/2017 21:45    Procedures Procedures (including critical care time)  Medications Ordered in ED Medications  nicotine (NICODERM CQ - dosed in mg/24 hours) patch 21 mg (21 mg Transdermal Not Given 01/21/17 2300)  HYDROmorphone (DILAUDID) injection 1 mg (1 mg Intravenous Given 01/21/17 1910)  sodium chloride 0.9 % bolus 1,000 mL (0 mLs Intravenous Stopped 01/21/17 2010)  iopamidol (ISOVUE-370) 76 % injection (65 mLs Intravenous Contrast Given 01/21/17 2112)  HYDROmorphone (DILAUDID) injection 0.5 mg (0.5 mg Intravenous Given 01/21/17 2023)  HYDROmorphone (DILAUDID) injection 0.5 mg (0.5 mg Intravenous Given 01/21/17 2259)  cefTRIAXone (ROCEPHIN) 2 g in dextrose 5 % 50 mL IVPB (0 g Intravenous Stopped 01/21/17 2329)  levofloxacin (LEVAQUIN) tablet 750 mg (750 mg Oral Given 01/21/17 2258)  heparin lock flush 100 unit/mL (500 Units Intracatheter Given 01/21/17 2345)     Initial Impression / Assessment and Plan / ED Course  I have reviewed the triage vital signs and the nursing notes.  Pertinent labs & imaging results that were available during my care of the patient were reviewed by me and considered in my medical decision making (see chart for details).    61 yo F with PMHx of breast CA on chemo (not neutropenic) here with ongoing cough, congestion, and general fatigue after recent tx of PNA with azithro. On arrival, VSS and WNL. Pt is overall very well appearing and in NAD, with normal WOB, normal O2 sats. CXR does show c/f PNA. Suspect ongoing, partially treated PNA as azithro is not adequate coverage given her comorbidities. However, cannot r/o PE as etiology given her history so will obtain CT for further assessment.  CT shows no PE but is positive for LLL PNA. WBC normal. LA normal. CMP unremarkable. Pt feels very well and would like to go home. I  discussed with Dr. Lindi Adie of Oncology. Given o/w well appearance, stable vitals, normal labs, he feels pt is reasonable outpt candidate. Will cover with Augmentin/Doxy for broad spectrum PNA coverage, and I advised her to return immediately if she does not feel better. I offered admission given her comorbidities but she would like to attempt outpt management, which I feel is reasonable.  Final Clinical Impressions(s) / ED Diagnoses   Final diagnoses:  Pneumonia of left lower lobe due to infectious organism [facility])  Dehydration    New Prescriptions Discharge Medication List as of 01/21/2017 10:30  PM    START taking these medications   Details  amoxicillin-clavulanate (AUGMENTIN) 875-125 MG tablet Take 1 tablet by mouth every 12 (twelve) hours., Starting Sat 01/21/2017, Until Tue 01/31/2017, Print    doxycycline (VIBRAMYCIN) 100 MG capsule Take 1 capsule (100 mg total) by mouth 2 (two) times daily., Starting Sat 01/21/2017, Until Tue 01/31/2017, Print         Duffy Bruce, MD 01/22/17 [phone]

## 2017-01-21 NOTE — ED Triage Notes (Signed)
Patient here from home with complaints of cough. States that she was diagnosed with pneumonia on the 2/28.  Given tessalon pearls and z-pac. No relief.

## 2017-01-21 NOTE — ED Notes (Signed)
BLOOD DRAW DELAYED. RN AT BEDSIDE, WILL PLACE IV AND OBTAIN LAB DRAW.

## 2017-01-23 ENCOUNTER — Emergency Department (HOSPITAL_COMMUNITY): Payer: Commercial Managed Care - PPO

## 2017-01-23 ENCOUNTER — Observation Stay (HOSPITAL_COMMUNITY)
Admission: EM | Admit: 2017-01-23 | Discharge: 2017-01-25 | Disposition: A | Discharge status: Home / Self Care | Payer: Commercial Managed Care - PPO | Attending: Internal Medicine | Admitting: Internal Medicine

## 2017-01-23 ENCOUNTER — Encounter (HOSPITAL_COMMUNITY): Payer: Self-pay | Admitting: Emergency Medicine

## 2017-01-23 DIAGNOSIS — Z803 Family history of malignant neoplasm of breast: Secondary | ICD-10-CM | POA: Diagnosis not present

## 2017-01-23 DIAGNOSIS — I2510 Atherosclerotic heart disease of native coronary artery without angina pectoris: Secondary | ICD-10-CM | POA: Insufficient documentation

## 2017-01-23 DIAGNOSIS — R0789 Other chest pain: Secondary | ICD-10-CM | POA: Diagnosis not present

## 2017-01-23 DIAGNOSIS — Z950 Presence of cardiac pacemaker: Secondary | ICD-10-CM | POA: Insufficient documentation

## 2017-01-23 DIAGNOSIS — Z86718 Personal history of other venous thrombosis and embolism: Secondary | ICD-10-CM | POA: Diagnosis not present

## 2017-01-23 DIAGNOSIS — C799 Secondary malignant neoplasm of unspecified site: Secondary | ICD-10-CM

## 2017-01-23 DIAGNOSIS — I10 Essential (primary) hypertension: Secondary | ICD-10-CM | POA: Insufficient documentation

## 2017-01-23 DIAGNOSIS — R05 Cough: Secondary | ICD-10-CM | POA: Diagnosis not present

## 2017-01-23 DIAGNOSIS — Z79899 Other long term (current) drug therapy: Secondary | ICD-10-CM | POA: Insufficient documentation

## 2017-01-23 DIAGNOSIS — Z800 Family history of malignant neoplasm of digestive organs: Secondary | ICD-10-CM | POA: Insufficient documentation

## 2017-01-23 DIAGNOSIS — R079 Chest pain, unspecified: Secondary | ICD-10-CM | POA: Insufficient documentation

## 2017-01-23 DIAGNOSIS — Z8051 Family history of malignant neoplasm of kidney: Secondary | ICD-10-CM | POA: Diagnosis not present

## 2017-01-23 DIAGNOSIS — R911 Solitary pulmonary nodule: Secondary | ICD-10-CM | POA: Diagnosis not present

## 2017-01-23 DIAGNOSIS — J189 Pneumonia, unspecified organism: Secondary | ICD-10-CM | POA: Diagnosis not present

## 2017-01-23 DIAGNOSIS — J90 Pleural effusion, not elsewhere classified: Secondary | ICD-10-CM | POA: Insufficient documentation

## 2017-01-23 DIAGNOSIS — F17200 Nicotine dependence, unspecified, uncomplicated: Secondary | ICD-10-CM | POA: Diagnosis not present

## 2017-01-23 DIAGNOSIS — Z885 Allergy status to narcotic agent status: Secondary | ICD-10-CM | POA: Insufficient documentation

## 2017-01-23 DIAGNOSIS — Z888 Allergy status to other drugs, medicaments and biological substances status: Secondary | ICD-10-CM | POA: Insufficient documentation

## 2017-01-23 DIAGNOSIS — Z923 Personal history of irradiation: Secondary | ICD-10-CM | POA: Diagnosis not present

## 2017-01-23 DIAGNOSIS — C50911 Malignant neoplasm of unspecified site of right female breast: Secondary | ICD-10-CM | POA: Diagnosis not present

## 2017-01-23 DIAGNOSIS — G8929 Other chronic pain: Secondary | ICD-10-CM | POA: Diagnosis not present

## 2017-01-23 DIAGNOSIS — Y842 Radiological procedure and radiotherapy as the cause of abnormal reaction of the patient, or of later complication, without mention of misadventure at the time of the procedure: Secondary | ICD-10-CM | POA: Insufficient documentation

## 2017-01-23 DIAGNOSIS — Z91018 Allergy to other foods: Secondary | ICD-10-CM | POA: Diagnosis not present

## 2017-01-23 DIAGNOSIS — J181 Lobar pneumonia, unspecified organism: Secondary | ICD-10-CM | POA: Diagnosis not present

## 2017-01-23 DIAGNOSIS — Z853 Personal history of malignant neoplasm of breast: Secondary | ICD-10-CM | POA: Diagnosis not present

## 2017-01-23 LAB — CBC WITH DIFFERENTIAL/PLATELET
Basophils Absolute: 0 10*3/uL (ref 0.0–0.1)
Basophils Relative: 0 %
Eosinophils Absolute: 0.1 10*3/uL (ref 0.0–0.7)
Eosinophils Relative: 1 %
HCT: 34.1 % — ABNORMAL LOW (ref 36.0–46.0)
Hemoglobin: 11.1 g/dL — ABNORMAL LOW (ref 12.0–15.0)
Lymphocytes Relative: 14 %
Lymphs Abs: 1.2 10*3/uL (ref 0.7–4.0)
MCH: 27 pg (ref 26.0–34.0)
MCHC: 32.6 g/dL (ref 30.0–36.0)
MCV: 83 fL (ref 78.0–100.0)
Monocytes Absolute: 0.7 10*3/uL (ref 0.1–1.0)
Monocytes Relative: 8 %
Neutro Abs: 6.9 10*3/uL (ref 1.7–7.7)
Neutrophils Relative %: 77 %
Platelets: 311 10*3/uL (ref 150–400)
RBC: 4.11 MIL/uL (ref 3.87–5.11)
RDW: 14.8 % (ref 11.5–15.5)
WBC: 9 10*3/uL (ref 4.0–10.5)

## 2017-01-23 LAB — COMPREHENSIVE METABOLIC PANEL
ALT: 10 U/L — ABNORMAL LOW (ref 14–54)
AST: 20 U/L (ref 15–41)
Albumin: 3.2 g/dL — ABNORMAL LOW (ref 3.5–5.0)
Alkaline Phosphatase: 83 U/L (ref 38–126)
Anion gap: 7 (ref 5–15)
BUN: 11 mg/dL (ref 6–20)
CO2: 28 mmol/L (ref 22–32)
Calcium: 9.3 mg/dL (ref 8.9–10.3)
Chloride: 102 mmol/L (ref 101–111)
Creatinine, Ser: 0.86 mg/dL (ref 0.44–1.00)
GFR calc Af Amer: >60 mL/min (ref >60)
GFR calc non Af Amer: >60 mL/min (ref >60)
Glucose, Bld: 120 mg/dL — ABNORMAL HIGH (ref 65–99)
Potassium: 3.3 mmol/L — ABNORMAL LOW (ref 3.5–5.1)
Sodium: 137 mmol/L (ref 135–145)
Total Bilirubin: 0.3 mg/dL (ref 0.3–1.2)
Total Protein: 7.8 g/dL (ref 6.5–8.1)

## 2017-01-23 LAB — URINALYSIS, ROUTINE W REFLEX MICROSCOPIC
Bilirubin Urine: NEGATIVE
Glucose, UA: NEGATIVE mg/dL
Hgb urine dipstick: NEGATIVE
Ketones, ur: NEGATIVE mg/dL
Leukocytes, UA: NEGATIVE
Nitrite: NEGATIVE
Protein, ur: NEGATIVE mg/dL
Specific Gravity, Urine: 1.017 (ref 1.005–1.030)
pH: 8 (ref 5.0–8.0)

## 2017-01-23 LAB — I-STAT CG4 LACTIC ACID, ED: Lactic Acid, Venous: 0.36 mmol/L — ABNORMAL LOW (ref 0.5–1.9)

## 2017-01-23 MED ORDER — HYDROMORPHONE HCL 1 MG/ML IJ SOLN
1.0000 mg | Freq: Once | INTRAMUSCULAR | Status: AC
  Administered 2017-01-23: 1 mg via INTRAVENOUS
  Filled 2017-01-23: qty 1

## 2017-01-23 MED ORDER — SERTRALINE HCL 50 MG PO TABS
50.0000 mg | ORAL_TABLET | Freq: Every day | ORAL | Status: DC
  Administered 2017-01-24 – 2017-01-25 (×2): 50 mg via ORAL
  Filled 2017-01-23 (×2): qty 1

## 2017-01-23 MED ORDER — MORPHINE SULFATE (PF) 4 MG/ML IV SOLN
4.0000 mg | Freq: Once | INTRAVENOUS | Status: AC
  Administered 2017-01-23: 4 mg via INTRAVENOUS
  Filled 2017-01-23: qty 1

## 2017-01-23 MED ORDER — CEFTRIAXONE SODIUM 1 G IJ SOLR
1.0000 g | INTRAMUSCULAR | Status: DC
  Administered 2017-01-24: 1 g via INTRAVENOUS
  Filled 2017-01-23 (×2): qty 10

## 2017-01-23 MED ORDER — HYDROMORPHONE HCL 1 MG/ML IJ SOLN
1.0000 mg | INTRAMUSCULAR | Status: DC | PRN
  Administered 2017-01-23 – 2017-01-24 (×3): 1 mg via INTRAVENOUS
  Filled 2017-01-23 (×4): qty 1

## 2017-01-23 MED ORDER — LEVOFLOXACIN IN D5W 750 MG/150ML IV SOLN: 750.0000 mg | INTRAVENOUS | Status: DC

## 2017-01-23 MED ORDER — SODIUM CHLORIDE 0.9 % IV SOLN
INTRAVENOUS | Status: DC
  Administered 2017-01-23 – 2017-01-25 (×3): via INTRAVENOUS

## 2017-01-23 MED ORDER — LEVOFLOXACIN IN D5W 750 MG/150ML IV SOLN
750.0000 mg | Freq: Once | INTRAVENOUS | Status: AC
  Administered 2017-01-23: 750 mg via INTRAVENOUS
  Filled 2017-01-23: qty 150

## 2017-01-23 MED ORDER — ENOXAPARIN SODIUM 40 MG/0.4ML ~~LOC~~ SOLN
40.0000 mg | Freq: Every day | SUBCUTANEOUS | Status: DC
  Administered 2017-01-24: 40 mg via SUBCUTANEOUS
  Filled 2017-01-23: qty 0.4

## 2017-01-23 MED ORDER — DEXTROSE 5 % IV SOLN
1.0000 g | Freq: Once | INTRAVENOUS | Status: AC
  Administered 2017-01-23: 1 g via INTRAVENOUS
  Filled 2017-01-23: qty 10

## 2017-01-23 MED ORDER — ALPRAZOLAM 1 MG PO TABS
2.0000 mg | ORAL_TABLET | Freq: Every day | ORAL | Status: DC
  Administered 2017-01-24 – 2017-01-25 (×2): 2 mg via ORAL
  Filled 2017-01-23 (×2): qty 2

## 2017-01-23 NOTE — H&P (Signed)
History and Physical    [name] FMB:[id number] DOB: 07-21-1956 DOA: 01/23/2017  PCP: [name], MD  Patient coming from: Home  I have personally briefly reviewed patient's old medical records in [place]  Chief Complaint: chest pain.   HPI: [name] is a 61 y.o. female with medical history significant of breast cancer follows up with Dr Maryann Alar, hypertension, was seen yesterday for chest pain and was found to have pneumonia on CT angiogram of the chest on the left and was recommended for admission, but she requested for discharge and went home. She presents today again for worsening chest pain going to the back, repeat CXR today shows stable lung consolidation. She was referred for admission for pain control and management of pneumonia. She reports fever or chills. Occasional dry cough. No nausea, vomiting or abdominal pain. She denies any other complaints.     Review of Systems: As per HPI otherwise 10 point review of systems negative.    Past Medical History:  Diagnosis Date  . Breast cancer ([place])   . DVT (deep venous thrombosis) ([place]) 10/07/2011  . Hypertension   . Radiation 11/29/2005-12/29/2005   right supraclavicular area 4147 cGy  . Radiation 06/26/02-08/14/02   right breast 5040 cGy, tumor bed boosted to 1260 cGy  . Rheumatic fever     Past Surgical History:  Procedure Laterality Date  . BREAST SURGERY    . MASTECTOMY     bilateral  . PORT A CATH REVISION       reports that she has been smoking.  She has never used smokeless tobacco. She reports that she drinks alcohol. She reports that she does not use drugs.  Allergies  Allergen Reactions  . Pork-Derived Products Other (See Comments)    Pt says she just doesn't eat pork; has no reaction to pork products  . Tramadol Nausea Only  . Hydrocodone-Acetaminophen Itching and Rash    Family History  Problem Relation Age of Onset  . Pancreatic cancer Mother   . Kidney cancer Sister 84  .  Breast cancer Sister 80  . Breast cancer Other 33   Reviewed.   Prior to Admission medications   Medication Sig Start Date End Date Taking? Authorizing Provider  alprazolam Duanne Moron) 2 MG tablet Take 2 mg by mouth daily.  03/15/13  Yes Historical Provider, MD  amoxicillin-clavulanate (AUGMENTIN) 875-125 MG tablet Take 1 tablet by mouth every 12 (twelve) hours. 01/21/17 01/31/17 Yes Duffy Bruce, MD  Ascorbic Acid (VITAMIN C) 100 MG tablet Take 100 mg by mouth daily.     Yes Historical Provider, MD  calcium-vitamin D (OSCAL WITH D) 500-200 MG-UNIT per tablet Take 1 tablet by mouth daily.     Yes Historical Provider, MD  doxycycline (VIBRAMYCIN) 100 MG capsule Take 1 capsule (100 mg total) by mouth 2 (two) times daily. 01/21/17 01/31/17 Yes Duffy Bruce, MD  Multiple Vitamin (MULTIVITAMIN) capsule Take 1 capsule by mouth daily.     Yes Historical Provider, MD  sertraline (ZOLOFT) 50 MG tablet Take 1 tablet (50 mg total) by mouth daily. 11/14/16  Yes Chauncey Cruel, MD  triamterene-hydrochlorothiazide (DYAZIDE) 37.5-25 MG capsule Take 1 each (1 capsule total) by mouth every morning. 01/18/17  Yes Chauncey Cruel, MD  azithromycin (ZITHROMAX) 250 MG tablet 2 tab on day 1 then 1 tab daily until complete Patient not taking: Reported on 01/21/2017 01/04/17   Gardenia Phlegm, NP  benzonatate (TESSALON) 200 MG capsule Take 1 capsule (200 mg total) by  mouth 3 (three) times daily as needed for cough. Patient not taking: Reported on 01/21/2017 01/04/17   Gardenia Phlegm, NP  ciprofloxacin (CIPRO) 250 MG tablet Take 1 tablet (250 mg total) by mouth 2 (two) times daily. Patient not taking: Reported on 01/21/2017 01/03/17   Chauncey Cruel, MD  ondansetron (ZOFRAN) 8 MG tablet TAKE ONE TABLET BY MOUTH TWICE DAILY AS NEEDED FOR NAUSEA AND VOMITING Patient not taking: Reported on 12/01/2016 06/20/16   Chauncey Cruel, MD  oxyCODONE-acetaminophen (PERCOCET) 10-325 MG tablet Take 1 tablet by mouth  every 8 (eight) hours as needed for pain. Patient not taking: Reported on 01/23/2017 11/16/16   Chauncey Cruel, MD  oxymorphone (OPANA ER) 15 MG 12 hr tablet Take 1 tablet (15 mg total) by mouth every 12 (twelve) hours. Patient not taking: Reported on 01/21/2017 11/16/16   Chauncey Cruel, MD  prochlorperazine (COMPAZINE) 10 MG tablet Take 1 tablet (10 mg total) by mouth every 6 (six) hours as needed (Nausea or vomiting). Patient not taking: Reported on 01/23/2017 01/17/17   Chauncey Cruel, MD  valACYclovir (VALTREX) 500 MG tablet Take 1 tablet (500 mg total) by mouth 2 (two) times daily. Patient not taking: Reported on 12/01/2016 07/19/16   Chauncey Cruel, MD    Physical Exam: Vitals:   01/23/17 1303 01/23/17 1738  BP: 110/76 108/74  Pulse: 87 67  Resp: 16 18  Temp: 98.8 F (37.1 C)   TempSrc: Oral   SpO2: 98% 97%  Height: 5\' 6"  (1.676 m)     Constitutional: NAD, calm, comfortable Vitals:   01/23/17 1303 01/23/17 1738  BP: 110/76 108/74  Pulse: 87 67  Resp: 16 18  Temp: 98.8 F (37.1 C)   TempSrc: Oral   SpO2: 98% 97%  Height: 5\' 6"  (1.676 m)    Eyes: PERRL, lids and conjunctivae normal ENMT: Mucous membranes are moist. Posterior pharynx clear of any exudate or lesions.Normal dentition.  Neck: normal, supple, no masses, no thyromegaly Respiratory: diminished at bases, more on the left and chest wall tenderness and back tenderness.   Cardiovascular: Regular rate and rhythm, no murmurs / rubs / gallops. No extremity edema. 2+ pedal pulses. No carotid bruits.  Abdomen: no tenderness, no masses palpated. No hepatosplenomegaly. Bowel sounds positive.  Musculoskeletal: no clubbing / cyanosis. No joint deformity upper and lower extremities. Good ROM, no contractures. Normal muscle tone.  Skin: no rashes, lesions, ulcers. No induration Neurologic: CN 2-12 grossly intact. Sensation intact, DTR normal. Strength 5/5 in all 4.  Psychiatric: Normal judgment and insight. Alert and  oriented x 3. Normal mood.    Labs on Admission: I have personally reviewed following labs and imaging studies  CBC:  Recent Labs Lab 01/21/17 1905 01/23/17 1343  WBC 10.3 9.0  NEUTROABS 8.0* 6.9  HGB 10.7* 11.1*  HCT 33.8* 34.1*  MCV 84.5 83.0  PLT 333 169   Basic Metabolic Panel:  Recent Labs Lab 01/21/17 1905 01/23/17 1343  NA 139 137  K 3.6 3.3*  CL 101 102  CO2 29 28  GLUCOSE 94 120*  BUN 15 11  CREATININE 0.91 0.86  CALCIUM 9.3 9.3   GFR: Estimated Creatinine Clearance: 70.8 mL/min (by C-G formula based on SCr of 0.86 mg/dL). Liver Function Tests:  Recent Labs Lab 01/21/17 1905 01/23/17 1343  AST 20 20  ALT 10* 10*  ALKPHOS 84 83  BILITOT 0.5 0.3  PROT 8.1 7.8  ALBUMIN 3.5 3.2*   No results for input(s):  LIPASE, AMYLASE in the last 168 hours. No results for input(s): AMMONIA in the last 168 hours. Coagulation Profile: No results for input(s): INR, PROTIME in the last 168 hours. Cardiac Enzymes: No results for input(s): CKTOTAL, CKMB, CKMBINDEX, TROPONINI in the last 168 hours. BNP (last 3 results) No results for input(s): PROBNP in the last 8760 hours. HbA1C: No results for input(s): HGBA1C in the last 72 hours. CBG: No results for input(s): GLUCAP in the last 168 hours. Lipid Profile: No results for input(s): CHOL, HDL, LDLCALC, TRIG, CHOLHDL, LDLDIRECT in the last 72 hours. Thyroid Function Tests: No results for input(s): TSH, T4TOTAL, FREET4, T3FREE, THYROIDAB in the last 72 hours. Anemia Panel: No results for input(s): VITAMINB12, FOLATE, FERRITIN, TIBC, IRON, RETICCTPCT in the last 72 hours. Urine analysis:    Component Value Date/Time   COLORURINE YELLOW 01/23/2017 1343   APPEARANCEUR CLEAR 01/23/2017 1343   LABSPEC 1.017 01/23/2017 1343   PHURINE 8.0 01/23/2017 1343   GLUCOSEU NEGATIVE 01/23/2017 1343   HGBUR NEGATIVE 01/23/2017 1343   BILIRUBINUR NEGATIVE 01/23/2017 1343   KETONESUR NEGATIVE 01/23/2017 1343   PROTEINUR  NEGATIVE 01/23/2017 1343   UROBILINOGEN 0.2 12/29/2010 1427   NITRITE NEGATIVE 01/23/2017 1343   LEUKOCYTESUR NEGATIVE 01/23/2017 1343    Radiological Exams on Admission: Dg Chest 2 View  Result Date: 01/23/2017 CLINICAL DATA:  Cough. EXAM: CHEST  2 VIEW COMPARISON:  Radiographs and CT scan of January 21, 2017. FINDINGS: The heart size and mediastinal contours are within normal limits. Left-sided pacemaker is unchanged in position. No pneumothorax or pleural effusion is noted. Right lung is clear. Stable left midlung opacity is noted as described on prior studies. The visualized skeletal structures are unremarkable. IMPRESSION: Stable left midlung opacity is noted as described on prior CT scan and radiographs. Electronically Signed   By: Marijo Conception, [name]   On: 01/23/2017 15:13   Dg Chest 2 View  Result Date: 01/21/2017 CLINICAL DATA:  Cough.  Diagnosis with pneumonia on February 28. EXAM: CHEST  2 VIEW COMPARISON:  01/04/2017 FINDINGS: Injectable catheter in stable position with tip overlying the expected location of the cavoatrial junction. Cardiomediastinal silhouette is normal. Mediastinal contours appear intact. There is no evidence of pneumothorax. There is persistent opacity within the perifissural portion of the left upper lobe with associated fissure wall thickening. There is also prominence of the left hilum. Osseous structures are without acute abnormality. Postsurgical changes in the right chest wall. Soft tissues are grossly normal. IMPRESSION: Persistent opacity within the inferior portion of the left upper lobe with associated perifissural thickening. Left perihilar thickening. This may represent an area of airspace consolidation, a pseudo tumor or a pulmonary mass. Further evaluation with chest CT may be considered. Electronically Signed   By: Fidela Salisbury [name]   On: 01/21/2017 18:54   Ct Angio Chest Pe W And/or Wo Contrast  Result Date: 01/21/2017 CLINICAL DATA:  Cough.  Diagnosis of pneumonia on 01/04/2017. No relief on medications. Progressive shortness of breath. EXAM: CT ANGIOGRAPHY CHEST WITH CONTRAST TECHNIQUE: Multidetector CT imaging of the chest was performed using the standard protocol during bolus administration of intravenous contrast. Multiplanar CT image reconstructions and MIPs were obtained to evaluate the vascular anatomy. CONTRAST:  65 mL Isovue 370 COMPARISON:  03/19/2015 FINDINGS: Cardiovascular: Satisfactory opacification of the pulmonary arteries to the segmental level. No evidence of pulmonary embolism. Normal heart size. No pericardial effusion. Normal caliber thoracic aorta. No aortic dissection. Great vessel origins are patent. Scattered calcifications in the aorta.  Coronary artery calcifications. Mediastinum/Nodes: No enlarged mediastinal, hilar, or axillary lymph nodes. Thyroid gland, trachea, and esophagus demonstrate no significant findings. Left-sided Infuse-A-Port with tip in the distal SVC. Lungs/Pleura: Small left pleural effusion with left basilar atelectasis or consolidation. Fluid in the left major fissure. Focal consolidation in the left lingula. No obstructing lesions are present. This likely represents pneumonia. Focal fibrosis demonstrated in the anterior right lung is probably postradiation change. Right lung is otherwise clear. 5 mm nodule in the right lung base is unchanged since previous study. 3 mm nodule seen previously in the central left lung is not identified due to consolidation in this area today. No new pulmonary nodules are demonstrated. Upper Abdomen: No acute abnormality. Musculoskeletal: Diffuse degenerative change throughout the thoracic spine. No destructive bone lesions. Postoperative bilateral mastectomies. Large subcutaneous soft tissue nodules in the subcutaneous fat over the back, mostly on the right side. These lesions have developed since the previous study. Largest is in the upper back to the right of midline and  measures about 2.9 x 4.9 cm. Multiple additional smaller lesions are demonstrated. Soft tissue nodules also demonstrated in the left axilla measuring up to 2 cm diameter, new since prior study. Appearances are suspicious for developing metastatic disease, either in lymph nodes and/or skin nodules. Review of the MIP images confirms the above findings. IMPRESSION: No evidence of significant pulmonary embolus. Consolidation in the left lung with small left pleural effusion, likely due to pneumonia. Radiation change in the anterior right lung. Interval development of multiple soft tissue nodules in the subcutaneous tissues over the right side of the back and in the left axilla. These changes are worrisome for metastatic disease either from soft tissue nodules or/and lymph nodes. Electronically Signed   By: Lucienne Capers [name]   On: 01/21/2017 21:45    EKG: not done.   Assessment/Plan Active Problems:   CAP (community acquired pneumonia)    Community acquired pneumonia: admit to telemetry.  Started on rocephin and levaquin.  Pain control with IV dilaudid.  Afebrile and no leukocytosis on labs today.  Lactic acid is normal.  Get EKG    Chest wall pain radiating to the back : Possibly from mets.  CT showed. Interval development of multiple soft tissue nodules in the subcutaneous tissues over the right side of the back and in the left axilla. They are suspicious for mets. Discussed with the patient.  Pain control.   Hypokalemia: replete as needed.    Breast Cancer:  Further management as per oncology.    Hypertension; controlled.    DVT prophylaxis: lovenox.  Code Status: full code.  Family Communication: husband at bedside Disposition Plan: to be determined Consults called: none Admission status: inpatient/ tele  Isael Stille MD [facility] Pager [phone]  If 7PM-7AM, please contact night-coverage [email or web address] Password [facility]  01/23/2017, 6:09 PM

## 2017-01-23 NOTE — ED Triage Notes (Signed)
Pt reports she has had a recent diagnosis of PNA. Was seen for this on Saturday, but pt refused admission. Came back today since she is not feeling better.  Pt being treated breast cancer, last treatment 18 days ago.

## 2017-01-23 NOTE — ED Notes (Signed)
Pt given sprite 

## 2017-01-23 NOTE — ED Provider Notes (Signed)
[facility] DEPT Provider Note   CSN: [id number] Arrival date & time: 01/23/17 1251     History    Chief Complaint  Patient presents with  . Pneumonia     HPI Vallie Fayette is a 61 y.o. female.  61yo F w/ PMH including breast cancer, DVT, HTN who p/w cough and chest pain. The patient was evaluated here in the ED 2 days ago for cough associated with chest pain. She was diagnosed with pneumonia and offered to be admitted to the hospital but patient wanted to treat pneumonia at home, so she was discharged with Augmentin and doxycycline. Patient states that her symptoms of chest pain have been persistent and severe despite home treatment. She has a cough productive of yellow-green phlegm. She denies any associated shortness of breath. MAXIMUM TEMPERATURE at home is 98.6. No associated vomiting or diarrhea. No rash or problems at port site.   Past Medical History:  Diagnosis Date  . Breast cancer ([place])   . DVT (deep venous thrombosis) ([place]) 10/07/2011  . Hypertension   . Radiation 11/29/2005-12/29/2005   right supraclavicular area 4147 cGy  . Radiation 06/26/02-08/14/02   right breast 5040 cGy, tumor bed boosted to 1260 cGy  . Rheumatic fever      Patient Active Problem List   Diagnosis Date Noted  . Port catheter in place 05/30/2016  . 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 Chronic pain 04/13/2015  . Left arm pain 08/18/2014  . Breast cancer of upper-inner quadrant of right female breast ([place]) 10/03/2013  . Post-lymphadenectomy lymphedema of arm 08/15/2013  . Port or reservoir infection 01/29/2013  . DVT (deep venous thrombosis) ([place]) 10/07/2011  . Chest pain 09/10/2009  . Secondary cardiomyopathy ([place]) 09/02/2009  . Essential hypertension 02/26/2009  . GERD 02/26/2009    Past Surgical History:  Procedure Laterality Date  . BREAST SURGERY    . MASTECTOMY     bilateral  . PORT A CATH REVISION      OB History    No data available         Home Medications    Prior to Admission medications   Medication Sig Start Date End Date Taking? Authorizing Provider  alprazolam Duanne Moron) 2 MG tablet Take 2 mg by mouth daily.  03/15/13   Historical Provider, MD  amoxicillin-clavulanate (AUGMENTIN) 875-125 MG tablet Take 1 tablet by mouth every 12 (twelve) hours. 01/21/17 01/31/17  Duffy Bruce, MD  Ascorbic Acid (VITAMIN C) 100 MG tablet Take 100 mg by mouth daily.      Historical Provider, MD  azithromycin (ZITHROMAX) 250 MG tablet 2 tab on day 1 then 1 tab daily until complete Patient not taking: Reported on 01/21/2017 01/04/17   Gardenia Phlegm, NP  benzonatate (TESSALON) 200 MG capsule Take 1 capsule (200 mg total) by mouth 3 (three) times daily as needed for cough. Patient not taking: Reported on 01/21/2017 01/04/17   Gardenia Phlegm, NP  calcium-vitamin D (OSCAL WITH D) 500-200 MG-UNIT per tablet Take 1 tablet by mouth daily.      Historical Provider, MD  ciprofloxacin (CIPRO) 250 MG tablet Take 1 tablet (250 mg total) by mouth 2 (two) times daily. Patient not taking: Reported on 01/21/2017 01/03/17   Chauncey Cruel, MD  diphenoxylate-atropine (LOMOTIL) 2.5-0.025 MG tablet Take 1 tablet by mouth 4 (four) times daily as needed for diarrhea or loose stools. Patient not taking: Reported on 12/01/2016 06/30/16   Chauncey Cruel, MD  doxycycline (VIBRAMYCIN) 100  MG capsule Take 1 capsule (100 mg total) by mouth 2 (two) times daily. 01/21/17 01/31/17  Duffy Bruce, MD  gabapentin (NEURONTIN) 300 MG capsule Take 1 capsule (300 mg total) by mouth at bedtime. Patient not taking: Reported on 01/21/2017 03/20/15   Chauncey Cruel, MD  Multiple Vitamin (MULTIVITAMIN) capsule Take 1 capsule by mouth daily.      Historical Provider, MD  ondansetron (ZOFRAN) 8 MG tablet TAKE ONE TABLET BY MOUTH TWICE DAILY AS NEEDED FOR NAUSEA AND VOMITING Patient not taking: Reported on 12/01/2016 06/20/16   Chauncey Cruel, MD   oxyCODONE-acetaminophen (PERCOCET) 10-325 MG tablet Take 1 tablet by mouth every 8 (eight) hours as needed for pain. 11/16/16   Chauncey Cruel, MD  oxymorphone (OPANA ER) 15 MG 12 hr tablet Take 1 tablet (15 mg total) by mouth every 12 (twelve) hours. Patient not taking: Reported on 01/21/2017 11/16/16   Chauncey Cruel, MD  prochlorperazine (COMPAZINE) 10 MG tablet Take 1 tablet (10 mg total) by mouth every 6 (six) hours as needed (Nausea or vomiting). 01/17/17   Chauncey Cruel, MD  sertraline (ZOLOFT) 50 MG tablet Take 1 tablet (50 mg total) by mouth daily. 11/14/16   Chauncey Cruel, MD  triamterene-hydrochlorothiazide (DYAZIDE) 37.5-25 MG capsule Take 1 each (1 capsule total) by mouth every morning. 01/18/17   Chauncey Cruel, MD  valACYclovir (VALTREX) 500 MG tablet Take 1 tablet (500 mg total) by mouth 2 (two) times daily. Patient not taking: Reported on 12/01/2016 07/19/16   Chauncey Cruel, MD      Family History  Problem Relation Age of Onset  . Pancreatic cancer Mother   . Kidney cancer Sister 63  . Breast cancer Sister 16  . Breast cancer Other 50     Social History  Substance Use Topics  . Smoking status: Light Tobacco Smoker  . Smokeless tobacco: Never Used  . Alcohol use Yes     Comment: Rarely     Allergies     Pork-derived products; Tramadol; and Hydrocodone-acetaminophen    Review of Systems  10 Systems reviewed and are negative for acute change except as noted in the HPI.   Physical Exam Updated Vital Signs BP 110/76 (BP Location: Left Arm)   Pulse 87   Temp 98.8 F (37.1 C) (Oral)   Resp 16   Ht 5\' 6"  (1.676 m)   SpO2 98%   Physical Exam  Constitutional: She is oriented to person, place, and time. She appears well-developed and well-nourished. No distress.  HENT:  Head: Normocephalic and atraumatic.  Moist mucous membranes  Eyes: Conjunctivae are normal. Pupils are equal, round, and reactive to light.  Neck: Neck supple.   Cardiovascular: Normal rate, regular rhythm and normal heart sounds.   No murmur heard. Pulmonary/Chest: Effort normal. No respiratory distress. She has no wheezes.  Port in L upper chest w/ no surrounding erythema; Mildly diminished breath sounds L lower lung  Abdominal: Soft. Bowel sounds are normal. She exhibits no distension. There is no tenderness.  Musculoskeletal: She exhibits no edema.  Multiple tumors on R thoracic back, no warmth or drainage  Neurological: She is alert and oriented to person, place, and time.  Fluent speech  Skin: Skin is warm and dry.  Psychiatric: She has a normal mood and affect. Judgment normal.  Nursing note and vitals reviewed.     ED Treatments / Results  Labs (all labs ordered are listed, but only abnormal results are displayed) Labs Reviewed  COMPREHENSIVE METABOLIC PANEL - Abnormal; Notable for the following:       Result Value   Potassium 3.3 (*)    Glucose, Bld 120 (*)    Albumin 3.2 (*)    ALT 10 (*)    All other components within normal limits  CBC WITH DIFFERENTIAL/PLATELET - Abnormal; Notable for the following:    Hemoglobin 11.1 (*)    HCT 34.1 (*)    All other components within normal limits  I-STAT CG4 LACTIC ACID, ED - Abnormal; Notable for the following:    Lactic Acid, Venous 0.36 (*)    All other components within normal limits  URINALYSIS, ROUTINE W REFLEX MICROSCOPIC     EKG  EKG Interpretation  Date/Time:    Ventricular Rate:    PR Interval:    QRS Duration:   QT Interval:    QTC Calculation:   R Axis:     Text Interpretation:           Radiology Dg Chest 2 View  Result Date: 01/21/2017 CLINICAL DATA:  Cough.  Diagnosis with pneumonia on February 28. EXAM: CHEST  2 VIEW COMPARISON:  01/04/2017 FINDINGS: Injectable catheter in stable position with tip overlying the expected location of the cavoatrial junction. Cardiomediastinal silhouette is normal. Mediastinal contours appear intact. There is no evidence  of pneumothorax. There is persistent opacity within the perifissural portion of the left upper lobe with associated fissure wall thickening. There is also prominence of the left hilum. Osseous structures are without acute abnormality. Postsurgical changes in the right chest wall. Soft tissues are grossly normal. IMPRESSION: Persistent opacity within the inferior portion of the left upper lobe with associated perifissural thickening. Left perihilar thickening. This may represent an area of airspace consolidation, a pseudo tumor or a pulmonary mass. Further evaluation with chest CT may be considered. Electronically Signed   By: Fidela Salisbury [name]   On: 01/21/2017 18:54   Ct Angio Chest Pe W And/or Wo Contrast  Result Date: 01/21/2017 CLINICAL DATA:  Cough. Diagnosis of pneumonia on 01/04/2017. No relief on medications. Progressive shortness of breath. EXAM: CT ANGIOGRAPHY CHEST WITH CONTRAST TECHNIQUE: Multidetector CT imaging of the chest was performed using the standard protocol during bolus administration of intravenous contrast. Multiplanar CT image reconstructions and MIPs were obtained to evaluate the vascular anatomy. CONTRAST:  65 mL Isovue 370 COMPARISON:  03/19/2015 FINDINGS: Cardiovascular: Satisfactory opacification of the pulmonary arteries to the segmental level. No evidence of pulmonary embolism. Normal heart size. No pericardial effusion. Normal caliber thoracic aorta. No aortic dissection. Great vessel origins are patent. Scattered calcifications in the aorta. Coronary artery calcifications. Mediastinum/Nodes: No enlarged mediastinal, hilar, or axillary lymph nodes. Thyroid gland, trachea, and esophagus demonstrate no significant findings. Left-sided Infuse-A-Port with tip in the distal SVC. Lungs/Pleura: Small left pleural effusion with left basilar atelectasis or consolidation. Fluid in the left major fissure. Focal consolidation in the left lingula. No obstructing lesions are present.  This likely represents pneumonia. Focal fibrosis demonstrated in the anterior right lung is probably postradiation change. Right lung is otherwise clear. 5 mm nodule in the right lung base is unchanged since previous study. 3 mm nodule seen previously in the central left lung is not identified due to consolidation in this area today. No new pulmonary nodules are demonstrated. Upper Abdomen: No acute abnormality. Musculoskeletal: Diffuse degenerative change throughout the thoracic spine. No destructive bone lesions. Postoperative bilateral mastectomies. Large subcutaneous soft tissue nodules in the subcutaneous fat over the back, mostly on the  right side. These lesions have developed since the previous study. Largest is in the upper back to the right of midline and measures about 2.9 x 4.9 cm. Multiple additional smaller lesions are demonstrated. Soft tissue nodules also demonstrated in the left axilla measuring up to 2 cm diameter, new since prior study. Appearances are suspicious for developing metastatic disease, either in lymph nodes and/or skin nodules. Review of the MIP images confirms the above findings. IMPRESSION: No evidence of significant pulmonary embolus. Consolidation in the left lung with small left pleural effusion, likely due to pneumonia. Radiation change in the anterior right lung. Interval development of multiple soft tissue nodules in the subcutaneous tissues over the right side of the back and in the left axilla. These changes are worrisome for metastatic disease either from soft tissue nodules or/and lymph nodes. Electronically Signed   By: Lucienne Capers [name]   On: 01/21/2017 21:45    Procedures Procedures (including critical care time) Procedures  Medications Ordered in ED  Medications  levofloxacin (LEVAQUIN) IVPB 750 mg (750 mg Intravenous New Bag/Given 01/23/17 1636)  morphine 4 MG/ML injection 4 mg (4 mg Intravenous Given 01/23/17 1422)  cefTRIAXone (ROCEPHIN) 1 g in dextrose  5 % 50 mL IVPB (0 g Intravenous Stopped 01/23/17 1635)  HYDROmorphone (DILAUDID) injection 1 mg (1 mg Intravenous Given 01/23/17 1611)     Initial Impression / Assessment and Plan / ED Course  I have reviewed the triage vital signs and the nursing notes.  Pertinent labs & imaging results that were available during my care of the patient were reviewed by me and considered in my medical decision making (see chart for details).     PT w/ breast cancer on chemo p/w persistent chest pain and cough, recently initiated augmentin and doxycycline at home with no improvement. She was awake and alert, breathing comfortably on RA on exam. VS unremarkable. No respiratory distress. Obtained above labs, gave morphine for pain.  Labwork shows normal lactate, reassuring CMP and CBC. Chest x-ray does show left-sided pneumonia. Because of the patient's worsening pain not controlled by home narcotics as well as report that her cough symptoms have worsened despite home treatment, and discussed admission with hospitalist, Dr. Karleen Hampshire. Pt admitted for further care.  Final Clinical Impressions(s) / ED Diagnoses   Final diagnoses:  Community acquired pneumonia of left lower lobe of lung ([place])  Chest wall pain  Metastatic cancer [facility])     New Prescriptions   No medications on file       Sharlett Iles, MD 01/23/17 1750

## 2017-01-23 NOTE — ED Notes (Signed)
Tech went to get updated vital signs on patient and she was not in room.  She had unconnected herself from her IV and put on her clothes and walked outside to smoke.  Did not know this until patient arrived back.

## 2017-01-24 DIAGNOSIS — J189 Pneumonia, unspecified organism: Secondary | ICD-10-CM | POA: Diagnosis not present

## 2017-01-24 DIAGNOSIS — J181 Lobar pneumonia, unspecified organism: Secondary | ICD-10-CM | POA: Diagnosis not present

## 2017-01-24 DIAGNOSIS — C799 Secondary malignant neoplasm of unspecified site: Secondary | ICD-10-CM | POA: Diagnosis not present

## 2017-01-24 LAB — BASIC METABOLIC PANEL
Anion gap: 10 (ref 5–15)
BUN: 8 mg/dL (ref 6–20)
CO2: 24 mmol/L (ref 22–32)
Calcium: 9.4 mg/dL (ref 8.9–10.3)
Chloride: 105 mmol/L (ref 101–111)
Creatinine, Ser: 0.81 mg/dL (ref 0.44–1.00)
GFR calc Af Amer: >60 mL/min (ref >60)
GFR calc non Af Amer: >60 mL/min (ref >60)
Glucose, Bld: 103 mg/dL — ABNORMAL HIGH (ref 65–99)
Potassium: 3.5 mmol/L (ref 3.5–5.1)
Sodium: 139 mmol/L (ref 135–145)

## 2017-01-24 LAB — CBC WITH DIFFERENTIAL/PLATELET
Basophils Absolute: 0 10*3/uL (ref 0.0–0.1)
Basophils Relative: 1 %
Eosinophils Absolute: 0.1 10*3/uL (ref 0.0–0.7)
Eosinophils Relative: 2 %
HCT: 34.7 % — ABNORMAL LOW (ref 36.0–46.0)
Hemoglobin: 11.2 g/dL — ABNORMAL LOW (ref 12.0–15.0)
Lymphocytes Relative: 21 %
Lymphs Abs: 1.7 10*3/uL (ref 0.7–4.0)
MCH: 27.2 pg (ref 26.0–34.0)
MCHC: 32.3 g/dL (ref 30.0–36.0)
MCV: 84.2 fL (ref 78.0–100.0)
Monocytes Absolute: 0.7 10*3/uL (ref 0.1–1.0)
Monocytes Relative: 9 %
Neutro Abs: 5.5 10*3/uL (ref 1.7–7.7)
Neutrophils Relative %: 69 %
Platelets: 324 10*3/uL (ref 150–400)
RBC: 4.12 MIL/uL (ref 3.87–5.11)
RDW: 14.8 % (ref 11.5–15.5)
WBC: 8.1 10*3/uL (ref 4.0–10.5)

## 2017-01-24 LAB — EXPECTORATED SPUTUM ASSESSMENT W GRAM STAIN, RFLX TO RESP C: Special Requests: NORMAL

## 2017-01-24 LAB — EXPECTORATED SPUTUM ASSESSMENT W REFEX TO RESP CULTURE

## 2017-01-24 LAB — STREP PNEUMONIAE URINARY ANTIGEN: Strep Pneumo Urinary Antigen: NEGATIVE

## 2017-01-24 MED ORDER — AZITHROMYCIN 500 MG IV SOLR
500.0000 mg | INTRAVENOUS | Status: DC
  Administered 2017-01-24: 500 mg via INTRAVENOUS
  Filled 2017-01-24 (×2): qty 500

## 2017-01-24 MED ORDER — NICOTINE 14 MG/24HR TD PT24
14.0000 mg | MEDICATED_PATCH | Freq: Every day | TRANSDERMAL | Status: DC
  Administered 2017-01-24 – 2017-01-25 (×2): 14 mg via TRANSDERMAL
  Filled 2017-01-24 (×2): qty 1

## 2017-01-24 MED ORDER — SODIUM CHLORIDE 0.9% FLUSH: 10.0000 mL | INTRAVENOUS | Status: DC | PRN

## 2017-01-24 MED ORDER — OXYCODONE HCL 5 MG PO TABS
5.0000 mg | ORAL_TABLET | Freq: Three times a day (TID) | ORAL | Status: DC | PRN
  Administered 2017-01-24 (×2): 5 mg via ORAL
  Filled 2017-01-24 (×2): qty 1

## 2017-01-24 MED ORDER — SODIUM CHLORIDE 0.9% FLUSH: 10.0000 mL | Freq: Two times a day (BID) | INTRAVENOUS | Status: DC

## 2017-01-24 MED ORDER — OXYCODONE-ACETAMINOPHEN 5-325 MG PO TABS
1.0000 | ORAL_TABLET | Freq: Three times a day (TID) | ORAL | Status: DC | PRN
  Administered 2017-01-24 – 2017-01-25 (×4): 1 via ORAL
  Filled 2017-01-24 (×4): qty 1

## 2017-01-24 MED ORDER — ONDANSETRON HCL 4 MG/2ML IJ SOLN
4.0000 mg | Freq: Four times a day (QID) | INTRAMUSCULAR | Status: DC | PRN
  Administered 2017-01-24: 4 mg via INTRAVENOUS
  Filled 2017-01-24: qty 2

## 2017-01-24 MED ORDER — TRIAMTERENE-HCTZ 37.5-25 MG PO TABS
1.0000 | ORAL_TABLET | Freq: Every day | ORAL | Status: DC
  Administered 2017-01-24 – 2017-01-25 (×2): 1 via ORAL
  Filled 2017-01-24 (×2): qty 1

## 2017-01-24 NOTE — Progress Notes (Signed)
PROGRESS NOTE    [name]  DPO:[id number] DOB: [birth year] DOA: 01/23/2017 PCP: [name], MD  Outpatient Specialists:   Dr. Jana Hakim Dr. Barry Dienes Dr. [name]  Brief Narrative:  47 ? Metastatic breast cancer status most right partial mastectomy and lymph node dissection--poorly compliant on therapy and 2/2 psychosocial issues , angiosarcoma Chronic pain on oxycodone as well as oxymorphone and has tried methadone known in the past with known intolerance to the same and frequent early refills Hypertension Bipolar  Seen in the emergency room 6/17-had some pneumonia diagnosed 2 weeks prior to admission she has had aching throbbing left chest and worsened by coughing associated with yellow sputum and her pressures been running lower than usual-CT scan in the emergency room showed left lower lobe pneumonia Was recommended to have admission however deferred the same and went home with Augmentin as well as doxycycline     Assessment & Plan:   Active Problems:   CAP (community acquired pneumonia)   Community acquired pneumonia-placed on Rocephin and Levaquin. IV fluids to continue at 75 cc an hour, repeat chest x-ray a.m. and trend CBC Metastatic breast cancer-conjugated prior medical history with some documented noncompliance-we will let oncologist know patient is here as courtesy--note previously on Hospice-supposed to have echo?  Will defer until patient d/c -no indication for the same currently Hypertension continue HCTZ  Bipolar continue sertraline 50 daily as well as Xanax 2 daily Chronic pain with tolerance-continue Dilaudid 1 mg every 3 when necessary and morphine 4 mg if needed-we will restart on her home dosages of medications and she will need to follow-up with her regular physicians in the outpatient setting for refills    Presumed full code Keep on telemetry Inpatient    Antimicrobials:   Ceftriaxone  azithro   Subjective:  Some nausea earlier  today Feels better Some sputum Appetite is decreaased   Objective: Vitals:   01/23/17 2039 01/23/17 2158 01/23/17 2220 01/24/17 0425  BP: (!) 118/105 114/73 124/88 131/66  Pulse: 71 (!) 59 67 64  Resp: 17 16 16 14   Temp:  98.2 F (36.8 C) 98.4 F (36.9 C) 98.6 F (37 C)  TempSrc:  Oral Oral Oral  SpO2: 99% 97% 98% 99%  Weight:   75.6 kg (166 lb 10.7 oz)   Height:   5\' 6"  (1.676 m)     Intake/Output Summary (Last 24 hours) at 01/24/17 0810 Last data filed at 01/24/17 0600  Gross per 24 hour  Intake           688.75 ml  Output              500 ml  Net           188.75 ml   Filed Weights   01/23/17 2220  Weight: 75.6 kg (166 lb 10.7 oz)    Examination:      Data Reviewed: I have personally reviewed following labs and imaging studies  CBC:  Recent Labs Lab 01/21/17 1905 01/23/17 1343 01/24/17 0657  WBC 10.3 9.0 8.1  NEUTROABS 8.0* 6.9 5.5  HGB 10.7* 11.1* 11.2*  HCT 33.8* 34.1* 34.7*  MCV 84.5 83.0 84.2  PLT 333 311 431   Basic Metabolic Panel:  Recent Labs Lab 01/21/17 1905 01/23/17 1343 01/24/17 0657  NA 139 137 139  K 3.6 3.3* 3.5  CL 101 102 105  CO2 29 28 24   GLUCOSE 94 120* 103*  BUN 15 11 8   CREATININE 0.91 0.86 0.81  CALCIUM 9.3 9.3 9.4  GFR: Estimated Creatinine Clearance: 75.8 mL/min (by C-G formula based on SCr of 0.81 mg/dL). Liver Function Tests:  Recent Labs Lab 01/21/17 1905 01/23/17 1343  AST 20 20  ALT 10* 10*  ALKPHOS 84 83  BILITOT 0.5 0.3  PROT 8.1 7.8  ALBUMIN 3.5 3.2*   No results for input(s): LIPASE, AMYLASE in the last 168 hours. No results for input(s): AMMONIA in the last 168 hours. Coagulation Profile: No results for input(s): INR, PROTIME in the last 168 hours. Cardiac Enzymes: No results for input(s): CKTOTAL, CKMB, CKMBINDEX, TROPONINI in the last 168 hours. BNP (last 3 results) No results for input(s): PROBNP in the last 8760 hours. HbA1C: No results for input(s): HGBA1C in the last 72  hours. CBG: No results for input(s): GLUCAP in the last 168 hours. Lipid Profile: No results for input(s): CHOL, HDL, LDLCALC, TRIG, CHOLHDL, LDLDIRECT in the last 72 hours. Thyroid Function Tests: No results for input(s): TSH, T4TOTAL, FREET4, T3FREE, THYROIDAB in the last 72 hours. Anemia Panel: No results for input(s): VITAMINB12, FOLATE, FERRITIN, TIBC, IRON, RETICCTPCT in the last 72 hours. Urine analysis:    Component Value Date/Time   COLORURINE YELLOW 01/23/2017 1343   APPEARANCEUR CLEAR 01/23/2017 1343   LABSPEC 1.017 01/23/2017 1343   PHURINE 8.0 01/23/2017 1343   GLUCOSEU NEGATIVE 01/23/2017 1343   HGBUR NEGATIVE 01/23/2017 1343   BILIRUBINUR NEGATIVE 01/23/2017 1343   KETONESUR NEGATIVE 01/23/2017 1343   PROTEINUR NEGATIVE 01/23/2017 1343   UROBILINOGEN 0.2 12/29/2010 1427   NITRITE NEGATIVE 01/23/2017 1343   LEUKOCYTESUR NEGATIVE 01/23/2017 1343   Sepsis Labs: @LABRCNTIP (procalcitonin:4,lacticidven:4)  ) Recent Results (from the past 240 hour(s))  Culture, sputum-assessment     Status: None   Collection Time: 01/23/17  2:08 AM  Result Value Ref Range Status   Specimen Description SPUTUM  Final   Special Requests Normal  Final   Sputum evaluation THIS SPECIMEN IS ACCEPTABLE FOR SPUTUM CULTURE  Final   Report Status 01/24/2017 FINAL  Final         Radiology Studies: Dg Chest 2 View  Result Date: 01/23/2017 CLINICAL DATA:  Cough. EXAM: CHEST  2 VIEW COMPARISON:  Radiographs and CT scan of January 21, 2017. FINDINGS: The heart size and mediastinal contours are within normal limits. Left-sided pacemaker is unchanged in position. No pneumothorax or pleural effusion is noted. Right lung is clear. Stable left midlung opacity is noted as described on prior studies. The visualized skeletal structures are unremarkable. IMPRESSION: Stable left midlung opacity is noted as described on prior CT scan and radiographs. Electronically Signed   By: Marijo Conception, [name]   On:  01/23/2017 15:13        Scheduled Meds: . alprazolam  2 mg Oral Daily  . cefTRIAXone (ROCEPHIN)  IV  1 g Intravenous Q24H  . enoxaparin (LOVENOX) injection  40 mg Subcutaneous QHS  . levofloxacin (LEVAQUIN) IV  750 mg Intravenous Q24H  . sertraline  50 mg Oral Daily  . sodium chloride flush  10-40 mL Intracatheter Q12H   Continuous Infusions: . sodium chloride 75 mL/hr at 01/23/17 2329     LOS: 1 day    Time spent: [place], MD [facility]   If 7PM-7AM, please contact night-coverage [email or web address] Password [email or web address] 01/24/2017, 8:10 AM

## 2017-01-25 DIAGNOSIS — J181 Lobar pneumonia, unspecified organism: Secondary | ICD-10-CM

## 2017-01-25 DIAGNOSIS — J189 Pneumonia, unspecified organism: Secondary | ICD-10-CM | POA: Diagnosis present

## 2017-01-25 DIAGNOSIS — I10 Essential (primary) hypertension: Secondary | ICD-10-CM

## 2017-01-25 DIAGNOSIS — G894 Chronic pain syndrome: Secondary | ICD-10-CM

## 2017-01-25 LAB — MRSA PCR SCREENING: MRSA by PCR: POSITIVE — AB

## 2017-01-25 LAB — HIV ANTIBODY (ROUTINE TESTING W REFLEX): HIV Screen 4th Generation wRfx: NONREACTIVE

## 2017-01-25 MED ORDER — AZITHROMYCIN 500 MG PO TABS: 500.0000 mg | ORAL_TABLET | Freq: Every day | ORAL | 0 refills | Status: AC

## 2017-01-25 MED ORDER — MUPIROCIN 2 % EX OINT: 1.0000 "application " | TOPICAL_OINTMENT | Freq: Two times a day (BID) | CUTANEOUS | 0 refills | Status: AC

## 2017-01-25 MED ORDER — HEPARIN SOD (PORK) LOCK FLUSH 100 UNIT/ML IV SOLN
500.0000 [IU] | INTRAVENOUS | Status: AC | PRN
  Administered 2017-01-25: 500 [IU]

## 2017-01-25 NOTE — Progress Notes (Signed)
SATURATION QUALIFICATIONS:   Patient Saturations on Room Air at Rest = 96 %  Patient Saturations on Room Air while Ambulating = 98%  Patient Saturations on  Liters of oxygen while Ambulating = %  Please briefly explain why patient needs home oxygen: Oxygen is not needed.  Patient tolerated activity very well, oxygen saturation sustained at 98%.

## 2017-01-25 NOTE — Progress Notes (Signed)
Patient is a&ox4, ambulatory without assist. Discharge instructions reviewed, questions concerns denied. No change from am assessment.

## 2017-01-25 NOTE — Care Management Note (Signed)
Case Management Note  Patient Details  Name: [name] MRN: [id number] Date of Birth: April 10, 1956  Subjective/Objective:60 y/o f admitted w/PNA. From home. No CM needs.                    Action/Plan:d/c home.   Expected Discharge Date:   (unknown)               Expected Discharge Plan:  Home/Self Care  In-House Referral:     Discharge planning Services  CM Consult  Post Acute Care Choice:    Choice offered to:     DME Arranged:    DME Agency:     HH Arranged:    HH Agency:     Status of Service:  In process, will continue to follow  If discussed at Long Length of Stay Meetings, dates discussed:    Additional Comments:  [name], RN 01/25/2017, 12:22 PM

## 2017-01-25 NOTE — Discharge Summary (Addendum)
Discharge Summary  [name] IHK:[id number] DOB: [birth year]  PCP: [name], MD  Admit date: 01/23/2017 Discharge date: 01/25/2017  Time spent: 25 minutes   Recommendations for Outpatient Follow-up:  1. New medication: Zithromax 500 mg by mouth daily 5 more days 2. Patient will follow-up with her PCP in the next few weeks  3. New medication: Bactroban 2% topically to nares twice a day for 5 days  Discharge Diagnoses:  Active Hospital Problems   Diagnosis Date Noted  . Pneumonia 01/25/2017  . CAP (community acquired pneumonia) 01/23/2017    Resolved Hospital Problems   Diagnosis Date Noted Date Resolved  No resolved problems to display.    Discharge Condition: Improved, being discharged home    Diet recommendation: Low-sodium   Vitals:   01/24/17 2123 01/25/17 0510  BP: (!) 134/94 (!) 162/70  Pulse: 68 (!) 53  Resp: 16 14  Temp: 98.3 F (36.8 C) 98.6 F (37 C)    History of present illness:  61 year old female with past medical history of metastatic breast cancer, essential hypertension and chronic pain evaluated yesterday in the emergency room for chest pain and found to have pneumonia on CT angiogram (after PE ruled out) opted to go home when it was advised she come in for admission. She came back in for worsening chest pain on 3/19 and repeat chest x-ray noted stable lung consolidation. Patient admitted for pain control management of pneumonia. She was started on IV antibiotics, nebulizers.  Hospital Course:  Active Problems:   CAP (community acquired pneumonia): No evidence of respiratory failure. Tolerated meds. By 3/21, able to ambulate on room air. Breathing comfortably. White count normal. Discharged home on by mouth antibiotics Chronic pain: Managed by PCP Previous history of metastatic breast cancer: Stable at this time, no evidence of new cancer Essential hypertension: Stable continue on home medications Overweight: Patient meets criteria with BMI  greater than 25 MRSA colonization: Commonly positive. Perception given on discharge for Bactroban 2% to nares twice a day for 5 days  Procedures:  None   Consultations:  None   Discharge Exam: BP (!) 162/70 (BP Location: Left Arm)   Pulse (!) 53   Temp 98.6 F (37 C) (Oral)   Resp 14   Ht 5\' 6"  (1.676 m)   Wt 75.6 kg (166 lb 10.7 oz)   SpO2 100%   BMI 26.90 kg/m   General: Alert and oriented 3, no acute distress  Cardiovascular: Regular rate and rhythm, S1-S2  Respiratory: Clear to auscultation bilaterally   Discharge Instructions You were cared for by a hospitalist during your hospital stay. If you have any questions about your discharge medications or the care you received while you were in the hospital after you are discharged, you can call the unit and asked to speak with the hospitalist on call if the hospitalist that took care of you is not available. Once you are discharged, your primary care physician will handle any further medical issues. Please note that NO REFILLS for any discharge medications will be authorized once you are discharged, as it is imperative that you return to your primary care physician (or establish a relationship with a primary care physician if you do not have one) for your aftercare needs so that they can reassess your need for medications and monitor your lab values.  Discharge Instructions    Diet - low sodium heart healthy    Complete by:  As directed    Increase activity slowly    Complete  by:  As directed      Allergies as of 01/25/2017      Reactions   Pork-derived Products Other (See Comments)   Pt says she just doesn't eat pork; has no reaction to pork products   Tramadol Nausea Only   Hydrocodone-acetaminophen Itching, Rash      Medication List    TAKE these medications   alprazolam 2 MG tablet Commonly known as:  XANAX Take 2 mg by mouth daily.   azithromycin 500 MG tablet Commonly known as:  ZITHROMAX Take 1 tablet (500  mg total) by mouth daily.   calcium-vitamin D 500-200 MG-UNIT tablet Commonly known as:  OSCAL WITH D Take 1 tablet by mouth daily.   multivitamin capsule Take 1 capsule by mouth daily.   mupirocin ointment 2 % Commonly known as:  BACTROBAN Place 1 application into the nose 2 (two) times daily.   sertraline 50 MG tablet Commonly known as:  ZOLOFT Take 1 tablet (50 mg total) by mouth daily.   triamterene-hydrochlorothiazide 37.5-25 MG capsule Commonly known as:  DYAZIDE Take 1 each (1 capsule total) by mouth every morning.   vitamin C 100 MG tablet Take 100 mg by mouth daily.        Allergies  Allergen Reactions  . Pork-Derived Products Other (See Comments)    Pt says she just doesn't eat pork; has no reaction to pork products  . Tramadol Nausea Only  . Hydrocodone-Acetaminophen Itching and Rash   Follow-up Information    [name], MD Follow up in 2 week(s).   Specialty:  Family Medicine Contact information: [place] [id number] [phone]            The results of significant diagnostics from this hospitalization (including imaging, microbiology, ancillary and laboratory) are listed below for reference.    Significant Diagnostic Studies: Dg Chest 2 View  Result Date: 01/23/2017 CLINICAL DATA:  Cough. EXAM: CHEST  2 VIEW COMPARISON:  Radiographs and CT scan of January 21, 2017. FINDINGS: The heart size and mediastinal contours are within normal limits. Left-sided pacemaker is unchanged in position. No pneumothorax or pleural effusion is noted. Right lung is clear. Stable left midlung opacity is noted as described on prior studies. The visualized skeletal structures are unremarkable. IMPRESSION: Stable left midlung opacity is noted as described on prior CT scan and radiographs. Electronically Signed   By: Marijo Conception, [name]   On: 01/23/2017 15:13   Dg Chest 2 View  Result Date: 01/21/2017 CLINICAL DATA:  Cough.  Diagnosis with  pneumonia on February 28. EXAM: CHEST  2 VIEW COMPARISON:  01/04/2017 FINDINGS: Injectable catheter in stable position with tip overlying the expected location of the cavoatrial junction. Cardiomediastinal silhouette is normal. Mediastinal contours appear intact. There is no evidence of pneumothorax. There is persistent opacity within the perifissural portion of the left upper lobe with associated fissure wall thickening. There is also prominence of the left hilum. Osseous structures are without acute abnormality. Postsurgical changes in the right chest wall. Soft tissues are grossly normal. IMPRESSION: Persistent opacity within the inferior portion of the left upper lobe with associated perifissural thickening. Left perihilar thickening. This may represent an area of airspace consolidation, a pseudo tumor or a pulmonary mass. Further evaluation with chest CT may be considered. Electronically Signed   By: Fidela Salisbury [name]   On: 01/21/2017 18:54   Dg Chest 2 View  Result Date: 01/04/2017 CLINICAL DATA:  Chemotherapy yesterday, fever, cough EXAM:  CHEST  2 VIEW COMPARISON:  12/01/2016 FINDINGS: There is left upper lobe airspace disease most concerning for pneumonia. There is no pleural effusion or pneumothorax. The heart and mediastinal contours are unremarkable. There is a left-sided Port-A-Cath with the tip projecting over the SVC. The osseous structures are unremarkable. IMPRESSION: Left upper lobe pneumonia. Followup PA and lateral chest X-ray is recommended in 3-4 weeks following trial of antibiotic therapy to ensure resolution and exclude underlying malignancy. Electronically Signed   By: Kathreen Devoid   On: 01/04/2017 12:17   Ct Angio Chest Pe W And/or Wo Contrast  Result Date: 01/21/2017 CLINICAL DATA:  Cough. Diagnosis of pneumonia on 01/04/2017. No relief on medications. Progressive shortness of breath. EXAM: CT ANGIOGRAPHY CHEST WITH CONTRAST TECHNIQUE: Multidetector CT imaging of the chest  was performed using the standard protocol during bolus administration of intravenous contrast. Multiplanar CT image reconstructions and MIPs were obtained to evaluate the vascular anatomy. CONTRAST:  65 mL Isovue 370 COMPARISON:  03/19/2015 FINDINGS: Cardiovascular: Satisfactory opacification of the pulmonary arteries to the segmental level. No evidence of pulmonary embolism. Normal heart size. No pericardial effusion. Normal caliber thoracic aorta. No aortic dissection. Great vessel origins are patent. Scattered calcifications in the aorta. Coronary artery calcifications. Mediastinum/Nodes: No enlarged mediastinal, hilar, or axillary lymph nodes. Thyroid gland, trachea, and esophagus demonstrate no significant findings. Left-sided Infuse-A-Port with tip in the distal SVC. Lungs/Pleura: Small left pleural effusion with left basilar atelectasis or consolidation. Fluid in the left major fissure. Focal consolidation in the left lingula. No obstructing lesions are present. This likely represents pneumonia. Focal fibrosis demonstrated in the anterior right lung is probably postradiation change. Right lung is otherwise clear. 5 mm nodule in the right lung base is unchanged since previous study. 3 mm nodule seen previously in the central left lung is not identified due to consolidation in this area today. No new pulmonary nodules are demonstrated. Upper Abdomen: No acute abnormality. Musculoskeletal: Diffuse degenerative change throughout the thoracic spine. No destructive bone lesions. Postoperative bilateral mastectomies. Large subcutaneous soft tissue nodules in the subcutaneous fat over the back, mostly on the right side. These lesions have developed since the previous study. Largest is in the upper back to the right of midline and measures about 2.9 x 4.9 cm. Multiple additional smaller lesions are demonstrated. Soft tissue nodules also demonstrated in the left axilla measuring up to 2 cm diameter, new since prior  study. Appearances are suspicious for developing metastatic disease, either in lymph nodes and/or skin nodules. Review of the MIP images confirms the above findings. IMPRESSION: No evidence of significant pulmonary embolus. Consolidation in the left lung with small left pleural effusion, likely due to pneumonia. Radiation change in the anterior right lung. Interval development of multiple soft tissue nodules in the subcutaneous tissues over the right side of the back and in the left axilla. These changes are worrisome for metastatic disease either from soft tissue nodules or/and lymph nodes. Electronically Signed   By: Lucienne Capers [name]   On: 01/21/2017 21:45    Microbiology: Recent Results (from the past 240 hour(s))  Culture, sputum-assessment     Status: None   Collection Time: 01/23/17  2:08 AM  Result Value Ref Range Status   Specimen Description SPUTUM  Final   Special Requests Normal  Final   Sputum evaluation THIS SPECIMEN IS ACCEPTABLE FOR SPUTUM CULTURE  Final   Report Status 01/24/2017 FINAL  Final  Culture, respiratory (NON-Expectorated)     Status: None (Preliminary result)  Collection Time: 01/23/17  2:08 AM  Result Value Ref Range Status   Specimen Description SPUTUM  Final   Special Requests Normal Reflexed from [id number]  Final   Gram Stain   Final    MODERATE WBC PRESENT, PREDOMINANTLY PMN FEW GRAM POSITIVE COCCI IN PAIRS RARE GRAM NEGATIVE RODS FEW GRAM NEGATIVE COCCI    Culture   Final    CULTURE REINCUBATED FOR BETTER GROWTH Performed at [facility], [place] [address], [place], [place] [address]    Report Status PENDING  Incomplete  Culture, blood (routine x 2) Call MD if unable to obtain prior to antibiotics being given     Status: None (Preliminary result)   Collection Time: 01/23/17 11:58 PM  Result Value Ref Range Status   Specimen Description BLOOD LEFT ANTECUBITAL  Final   Special Requests BOTTLES DRAWN AEROBIC ONLY 5ML  Final   Culture   Final    NO  GROWTH 1 DAY Performed at [facility], [place] [address], [place], [place] [address]    Report Status PENDING  Incomplete  Culture, blood (routine x 2) Call MD if unable to obtain prior to antibiotics being given     Status: None (Preliminary result)   Collection Time: 01/23/17 11:58 PM  Result Value Ref Range Status   Specimen Description BLOOD LEFT HAND  Final   Special Requests IN PEDIATRIC BOTTLE 4ML  Final   Culture   Final    NO GROWTH 1 DAY Performed at [facility], [place] [address] [place],  [address]    Report Status PENDING  Incomplete     Labs: Basic Metabolic Panel:  Recent Labs Lab 01/21/17 1905 01/23/17 1343 01/24/17 0657  NA 139 137 139  K 3.6 3.3* 3.5  CL 101 102 105  CO2 29 28 24   GLUCOSE 94 120* 103*  BUN 15 11 8   CREATININE 0.91 0.86 0.81  CALCIUM 9.3 9.3 9.4   Liver Function Tests:  Recent Labs Lab 01/21/17 1905 01/23/17 1343  AST 20 20  ALT 10* 10*  ALKPHOS 84 83  BILITOT 0.5 0.3  PROT 8.1 7.8  ALBUMIN 3.5 3.2*   No results for input(s): LIPASE, AMYLASE in the last 168 hours. No results for input(s): AMMONIA in the last 168 hours. CBC:  Recent Labs Lab 01/21/17 1905 01/23/17 1343 01/24/17 0657  WBC 10.3 9.0 8.1  NEUTROABS 8.0* 6.9 5.5  HGB 10.7* 11.1* 11.2*  HCT 33.8* 34.1* 34.7*  MCV 84.5 83.0 84.2  PLT 333 311 324   Cardiac Enzymes: No results for input(s): CKTOTAL, CKMB, CKMBINDEX, TROPONINI in the last 168 hours. BNP: BNP (last 3 results)  Recent Labs  01/21/17 1905  BNP 16.1    ProBNP (last 3 results) No results for input(s): PROBNP in the last 8760 hours.  CBG: No results for input(s): GLUCAP in the last 168 hours.     Signed:  Annita Brod, MD [facility] 01/25/2017, 3:12 PM

## 2017-01-26 ENCOUNTER — Other Ambulatory Visit: Payer: Self-pay | Admitting: Oncology

## 2017-01-26 LAB — CULTURE, RESPIRATORY: Culture: NORMAL

## 2017-01-26 LAB — CULTURE, RESPIRATORY W GRAM STAIN: Special Requests: NORMAL

## 2017-01-29 LAB — CULTURE, BLOOD (ROUTINE X 2)
Culture: NO GROWTH
Culture: NO GROWTH

## 2017-01-31 ENCOUNTER — Ambulatory Visit: Payer: Commercial Managed Care - PPO

## 2017-01-31 ENCOUNTER — Other Ambulatory Visit (HOSPITAL_BASED_OUTPATIENT_CLINIC_OR_DEPARTMENT_OTHER): Payer: Commercial Managed Care - PPO

## 2017-01-31 ENCOUNTER — Ambulatory Visit (HOSPITAL_BASED_OUTPATIENT_CLINIC_OR_DEPARTMENT_OTHER): Payer: Commercial Managed Care - PPO | Admitting: Oncology

## 2017-01-31 VITALS — BP 116/79 | HR 74 | Temp 97.8°F | Resp 18 | Ht 66.0 in | Wt 156.6 lb

## 2017-01-31 DIAGNOSIS — I890 Lymphedema, not elsewhere classified: Secondary | ICD-10-CM | POA: Diagnosis not present

## 2017-01-31 DIAGNOSIS — C778 Secondary and unspecified malignant neoplasm of lymph nodes of multiple regions: Secondary | ICD-10-CM | POA: Diagnosis not present

## 2017-01-31 DIAGNOSIS — G893 Neoplasm related pain (acute) (chronic): Secondary | ICD-10-CM

## 2017-01-31 DIAGNOSIS — C4911 Malignant neoplasm of connective and soft tissue of right upper limb, including shoulder: Secondary | ICD-10-CM

## 2017-01-31 DIAGNOSIS — Z95828 Presence of other vascular implants and grafts: Secondary | ICD-10-CM

## 2017-01-31 DIAGNOSIS — Z170 Estrogen receptor positive status [ER+]: Secondary | ICD-10-CM | POA: Diagnosis not present

## 2017-01-31 DIAGNOSIS — J189 Pneumonia, unspecified organism: Secondary | ICD-10-CM

## 2017-01-31 DIAGNOSIS — Z86718 Personal history of other venous thrombosis and embolism: Secondary | ICD-10-CM

## 2017-01-31 DIAGNOSIS — R222 Localized swelling, mass and lump, trunk: Secondary | ICD-10-CM

## 2017-01-31 DIAGNOSIS — C50211 Malignant neoplasm of upper-inner quadrant of right female breast: Secondary | ICD-10-CM | POA: Diagnosis not present

## 2017-01-31 LAB — CBC WITH DIFFERENTIAL/PLATELET
BASO%: 0.7 % (ref 0.0–2.0)
Basophils Absolute: 0 10*3/uL (ref 0.0–0.1)
EOS%: 4.2 % (ref 0.0–7.0)
Eosinophils Absolute: 0.3 10*3/uL (ref 0.0–0.5)
HCT: 37.8 % (ref 34.8–46.6)
HGB: 12.4 g/dL (ref 11.6–15.9)
LYMPH%: 15.9 % (ref 14.0–49.7)
MCH: 27.9 pg (ref 25.1–34.0)
MCHC: 32.8 g/dL (ref 31.5–36.0)
MCV: 85.2 fL (ref 79.5–101.0)
MONO#: 0.8 10*3/uL (ref 0.1–0.9)
MONO%: 11.9 % (ref 0.0–14.0)
NEUT#: 4.4 10*3/uL (ref 1.5–6.5)
NEUT%: 67.3 % (ref 38.4–76.8)
Platelets: 292 10*3/uL (ref 145–400)
RBC: 4.43 10*6/uL (ref 3.70–5.45)
RDW: 15.3 % — ABNORMAL HIGH (ref 11.2–14.5)
WBC: 6.5 10*3/uL (ref 3.9–10.3)
lymph#: 1 10*3/uL (ref 0.9–3.3)

## 2017-01-31 LAB — COMPREHENSIVE METABOLIC PANEL
ALT: 17 U/L (ref 0–55)
AST: 26 U/L (ref 5–34)
Albumin: 3.7 g/dL (ref 3.5–5.0)
Alkaline Phosphatase: 104 U/L (ref 40–150)
Anion Gap: 12 mEq/L — ABNORMAL HIGH (ref 3–11)
BUN: 22.4 mg/dL (ref 7.0–26.0)
CO2: 25 mEq/L (ref 22–29)
Calcium: 9.8 mg/dL (ref 8.4–10.4)
Chloride: 103 mEq/L (ref 98–109)
Creatinine: 1.2 mg/dL — ABNORMAL HIGH (ref 0.6–1.1)
EGFR: 56 mL/min/{1.73_m2} — ABNORMAL LOW (ref >90)
Glucose: 107 mg/dl (ref 70–140)
Potassium: 3.8 mEq/L (ref 3.5–5.1)
Sodium: 140 mEq/L (ref 136–145)
Total Bilirubin: 0.26 mg/dL (ref 0.20–1.20)
Total Protein: 8 g/dL (ref 6.4–8.3)

## 2017-01-31 MED ORDER — SODIUM CHLORIDE 0.9 % IJ SOLN
10.0000 mL | INTRAMUSCULAR | Status: DC | PRN
  Administered 2017-01-31: 10 mL via INTRAVENOUS
  Filled 2017-01-31: qty 10

## 2017-01-31 NOTE — Progress Notes (Signed)
[place]  [phone] [phone]) [phone]  OFFICE PROGRESS NOTE   ID: [name]   DOB: Apr 25, 1956  MR#: [id number]  ZYY#:[id number]   PCP: [name] Hey, MD [name], [name], [name] RAD WUG:[id number] [name], [name] OTHER:  [name], MD, [name]  CHIEF COMPLAINT:  Stage IV Breast Cancer, angiosarcoma  CURRENT TREATMENT: Abraxane, trastuzumab, pertuzumab  BREAST CANCER HISTORY: From the original intake note:  The patient originally presented in May 2003 when she noticed a lump in the upper inner quadrant of her right breast in September 2003.  She sought attention and had a mammogram which showed an obvious carcinoma in the upper inner quadrant of the right breast, approximately 2 cm.  There were some enlarged lymph nodes in the axilla and an FNA done showed those consistent with malignant cells, most likely an invasive ductal carcinoma.  At that point she was unsure of what to do and was referred by Dr. [name] for a discussion of her treatment options.  By biopsy, it was ER/PR negative and HER-2 was 3+.  DNA index was 1.42.  We reiterated that it was most important to have her disease surgically addressed at that point and had recommended lumpectomy and axillary nodes to be addressed with which she followed through and on 04-03-02, Dr. [name] performed a right partial mastectomy and a right axillary lymph node dissection.  Final pathology revealed a 2.4 cm. high grade, Grade III invasive ductal carcinoma with an adjacent .8 cm. also high grade invasive ductal carcinoma which was felt to represent an intramammary metastasis rather than a second primary.  A smaller mass was just medial to the larger mass.  The smaller mass was associated with high grade DCIS component.  There was no definite lymphovascular invasion identified.  However, one of fourteen axillary lymph nodes did contain a 1.5 cm. metastatic  deposit.  Postoperatively she did very well.  We reiterated the fact that she did need adjuvant chemotherapy, however, she refused and decided that she would pursue radiation.  She received radiation and completed that on 08-14-02. Her subsequent history is as detailed below.   INTERVAL HISTORY: [name] returns today for follow-up of her metastatic breast cancer and sarcoma. Since her last visit here she had a visit to the emergency room with a CT angiography of the chest showing no evidence of pulmonary embolus with some consolidation in the left lung with a small left effusion felt to be consistent with pneumonia. She was treated as an outpatient with oral antibiotics but then admitted 01/23/2017 and discharged home on Zithromax.  REVIEW OF SYSTEMS: [name] has lost a few pounds. She tells me her breathing is now just fine. She denies shortness of breath, cough, phlegm production, or pleurisy. Her pain is well-controlled on her current medications and she denies any constipation. She is appropriately concerned about the "bumps" on her right back." These are lymph nodes". She is still feeling a little bit tired from her recent pneumonia but otherwise a detailed review of systems today was remarkably benign.  PAST MEDICAL HISTORY: Past Medical History:  Diagnosis Date  . Breast cancer ([place])   . DVT (deep venous thrombosis) ([place]) 10/07/2011  . Hypertension   . Radiation 11/29/2005-12/29/2005   right supraclavicular area 4147 cGy  . Radiation 06/26/02-08/14/02   right breast 5040 cGy, tumor bed boosted to 1260 cGy  . Rheumatic fever     PAST SURGICAL HISTORY: Past Surgical History:  Procedure  Laterality Date  . BREAST SURGERY    . MASTECTOMY     bilateral  . PORT A CATH REVISION      FAMILY HISTORY Family History  Problem Relation Age of Onset  . Pancreatic cancer Mother   . Kidney cancer Sister 63  . Breast cancer Sister 23  . Breast cancer Other 25  The patient's father died in  his 29F  from complications of alcohol abuse. The patient's mother died in her 34s from pancreatic cancer. The patient has 6 brothers, 2 sisters. Both her sisters have had breast cancer, both diagnosed after the age of 62. The patient has not had genetic testing so far.   GYNECOLOGIC HISTORY: Menarche age 59; first live birth age 76. She is GXP4. She is not sure when she stopped having periods. She never used hormone replacement.  SOCIAL HISTORY: The patient has worked in the past as a Psychologist, counselling. At home in addition to the patient are her husband [name], originally from [place], who works as a Administrator.  Also living in the home is her son [name] and her daughter [name] (she is studying to be a Marine scientist).  The other 2 children are [name], who has a college degree in psychology and works as a Probation officer; and [name], who lives in [place] and works as a Higher education careers adviser.   ADVANCED DIRECTIVES: Not in place  HEALTH MAINTENANCE: Social History  Substance Use Topics  . Smoking status: Light Tobacco Smoker  . Smokeless tobacco: Never Used  . Alcohol use Yes     Comment: Rarely     Colonoscopy:  Not on file  PAP:  Not on file  Bone density:  Not on file  Lipid panel:  Not on file    Allergies  Allergen Reactions  . Pork-Derived Products Other (See Comments)    Pt says she just doesn't eat pork; has no reaction to pork products  . Tramadol Nausea Only  . Hydrocodone-Acetaminophen Itching and Rash    Current Outpatient Prescriptions  Medication Sig Dispense Refill  . alprazolam (XANAX) 2 MG tablet Take 2 mg by mouth daily.     . Ascorbic Acid (VITAMIN C) 100 MG tablet Take 100 mg by mouth daily.      . calcium-vitamin D (OSCAL WITH D) 500-200 MG-UNIT per tablet Take 1 tablet by mouth daily.      . Multiple Vitamin (MULTIVITAMIN) capsule Take 1 capsule by mouth daily.      . sertraline (ZOLOFT) 50 MG tablet Take 1 tablet (50 mg total) by mouth daily. 30 tablet 3  .  triamterene-hydrochlorothiazide (DYAZIDE) 37.5-25 MG capsule Take 1 each (1 capsule total) by mouth every morning. 30 capsule 3   No current facility-administered medications for this visit.    Facility-Administered Medications Ordered in Other Visits  Medication Dose Route Frequency Provider Last Rate Last Dose  . sodium chloride 0.9 % injection 10 mL  10 mL Intravenous PRN Chauncey Cruel, MD   10 mL at 03/04/14 1421    OBJECTIVE: Middle-aged African American woman   Vitals:   01/31/17 1005  BP: 116/79  Pulse: 74  Resp: 18  Temp: 97.8 F (36.6 C)     Body mass index is 25.28 kg/m.    ECOG FS: 1 Filed Weights   01/31/17 1005  Weight: 156 lb 9.6 oz (71 kg)   Sclerae unicteric, EOMs intact Oropharynx clear and moist No cervical or supraclavicular adenopathy Lungs no rales or rhonchi Heart regular rate and  rhythm Abd soft, nontender, positive bowel sounds MSK no focal spinal tenderness, chronic right upper extremity lymphedema unchanged Neuro: nonfocal, well oriented, appropriate affect Breasts: Status post bilateral mastectomies.    Photo right back 01/31/2017       LAB RESULTS:.  Lab Results  Component Value Date   WBC 6.5 01/31/2017   NEUTROABS 4.4 01/31/2017   HGB 12.4 01/31/2017   HCT 37.8 01/31/2017   MCV 85.2 01/31/2017   PLT 292 01/31/2017      Chemistry      Component Value Date/Time   NA 140 01/31/2017 0945   K 3.8 01/31/2017 0945   CL 105 01/24/2017 0657   CL 101 03/07/2013 1411   CO2 25 01/31/2017 0945   BUN 22.4 01/31/2017 0945   CREATININE 1.2 (H) 01/31/2017 0945      Component Value Date/Time   CALCIUM 9.8 01/31/2017 0945   ALKPHOS 104 01/31/2017 0945   AST 26 01/31/2017 0945   ALT 17 01/31/2017 0945   BILITOT 0.26 01/31/2017 0945       STUDIES: Dg Chest 2 View  Result Date: 01/23/2017 CLINICAL DATA:  Cough. EXAM: CHEST  2 VIEW COMPARISON:  Radiographs and CT scan of January 21, 2017. FINDINGS: The heart size and mediastinal  contours are within normal limits. Left-sided pacemaker is unchanged in position. No pneumothorax or pleural effusion is noted. Right lung is clear. Stable left midlung opacity is noted as described on prior studies. The visualized skeletal structures are unremarkable. IMPRESSION: Stable left midlung opacity is noted as described on prior CT scan and radiographs. Electronically Signed   By: Marijo Conception, [name]   On: 01/23/2017 15:13   Dg Chest 2 View  Result Date: 01/21/2017 CLINICAL DATA:  Cough.  Diagnosis with pneumonia on February 28. EXAM: CHEST  2 VIEW COMPARISON:  01/04/2017 FINDINGS: Injectable catheter in stable position with tip overlying the expected location of the cavoatrial junction. Cardiomediastinal silhouette is normal. Mediastinal contours appear intact. There is no evidence of pneumothorax. There is persistent opacity within the perifissural portion of the left upper lobe with associated fissure wall thickening. There is also prominence of the left hilum. Osseous structures are without acute abnormality. Postsurgical changes in the right chest wall. Soft tissues are grossly normal. IMPRESSION: Persistent opacity within the inferior portion of the left upper lobe with associated perifissural thickening. Left perihilar thickening. This may represent an area of airspace consolidation, a pseudo tumor or a pulmonary mass. Further evaluation with chest CT may be considered. Electronically Signed   By: Fidela Salisbury [name]   On: 01/21/2017 18:54   Dg Chest 2 View  Result Date: 01/04/2017 CLINICAL DATA:  Chemotherapy yesterday, fever, cough EXAM: CHEST  2 VIEW COMPARISON:  12/01/2016 FINDINGS: There is left upper lobe airspace disease most concerning for pneumonia. There is no pleural effusion or pneumothorax. The heart and mediastinal contours are unremarkable. There is a left-sided Port-A-Cath with the tip projecting over the SVC. The osseous structures are unremarkable. IMPRESSION: Left  upper lobe pneumonia. Followup PA and lateral chest X-ray is recommended in 3-4 weeks following trial of antibiotic therapy to ensure resolution and exclude underlying malignancy. Electronically Signed   By: Kathreen Devoid   On: 01/04/2017 12:17   Ct Angio Chest Pe W And/or Wo Contrast  Result Date: 01/21/2017 CLINICAL DATA:  Cough. Diagnosis of pneumonia on 01/04/2017. No relief on medications. Progressive shortness of breath. EXAM: CT ANGIOGRAPHY CHEST WITH CONTRAST TECHNIQUE: Multidetector CT imaging of the chest was  performed using the standard protocol during bolus administration of intravenous contrast. Multiplanar CT image reconstructions and MIPs were obtained to evaluate the vascular anatomy. CONTRAST:  65 mL Isovue 370 COMPARISON:  03/19/2015 FINDINGS: Cardiovascular: Satisfactory opacification of the pulmonary arteries to the segmental level. No evidence of pulmonary embolism. Normal heart size. No pericardial effusion. Normal caliber thoracic aorta. No aortic dissection. Great vessel origins are patent. Scattered calcifications in the aorta. Coronary artery calcifications. Mediastinum/Nodes: No enlarged mediastinal, hilar, or axillary lymph nodes. Thyroid gland, trachea, and esophagus demonstrate no significant findings. Left-sided Infuse-A-Port with tip in the distal SVC. Lungs/Pleura: Small left pleural effusion with left basilar atelectasis or consolidation. Fluid in the left major fissure. Focal consolidation in the left lingula. No obstructing lesions are present. This likely represents pneumonia. Focal fibrosis demonstrated in the anterior right lung is probably postradiation change. Right lung is otherwise clear. 5 mm nodule in the right lung base is unchanged since previous study. 3 mm nodule seen previously in the central left lung is not identified due to consolidation in this area today. No new pulmonary nodules are demonstrated. Upper Abdomen: No acute abnormality. Musculoskeletal: Diffuse  degenerative change throughout the thoracic spine. No destructive bone lesions. Postoperative bilateral mastectomies. Large subcutaneous soft tissue nodules in the subcutaneous fat over the back, mostly on the right side. These lesions have developed since the previous study. Largest is in the upper back to the right of midline and measures about 2.9 x 4.9 cm. Multiple additional smaller lesions are demonstrated. Soft tissue nodules also demonstrated in the left axilla measuring up to 2 cm diameter, new since prior study. Appearances are suspicious for developing metastatic disease, either in lymph nodes and/or skin nodules. Review of the MIP images confirms the above findings. IMPRESSION: No evidence of significant pulmonary embolus. Consolidation in the left lung with small left pleural effusion, likely due to pneumonia. Radiation change in the anterior right lung. Interval development of multiple soft tissue nodules in the subcutaneous tissues over the right side of the back and in the left axilla. These changes are worrisome for metastatic disease either from soft tissue nodules or/and lymph nodes. Electronically Signed   By: Lucienne Capers [name]   On: 01/21/2017 21:45     ASSESSMENT:  61 y.o. [place] woman with stage IV breast cancer manifested chiefly by loco-regional nodal disease (neck, chest), without lung, liver, bone, or brain involvement.  (1) Status post right upper inner quadrant lumpectomy and sentinel lymph node sampling 03/04/2002 for 2 separate foci of invasive ductal carcinoma, mpT2 pN1 or stage IIB, both foci grade 3, both estrogen and progesterone receptor positive, both HercepTest 3+, Mib-1 56%  (2) Reexcision for margins 05/27/2002 showed no residual cancer in the breast.  (3) The patient refused adjuvant systemic therapy.  (4) Adjuvant radiation treatment completed 08/14/2002.  (5) recurrence in the right breast in 02/2004 showing a morphologically different tumor, again  grade 3, again estrogen and progesterone receptor negative, with an MIB-1 of 14% and Herceptest 3+.  (5) Between 03/2004 and 07/2004 she received dose dense Doxorubicin/Cyclophosphamide x 4 given with trastuzumab, followed by weekly Docetaxel x 8, again given with trastuzumab.  (6) Right mastectomy 07/13/2004 showed scattered microscopic foci of residual disease over an area  greater than 5 cm. Margins were negative.  (7) Postoperative Docetaxel continued until 09/2004.  (8) Trastuzumab (Herceptin) given 08/2004 through 01/2012 with some brief interruptions.  (9) Isolated right cervical nodal recurrence 10/2005, treated with radiation to the right supraclavicular area (total  41.5 gray) completed 12/29/2005.  (10) Navelbine given together with Herceptin  11/2005 through 03/2006.  (9) Left mastectomy 02/13/2006 for ductal carcinoma in situ, grade 2, estrogen and progesterone receptor negative, with negative margins; 0 of 3 lymph nodes involved  (10) treated with Lapatinib and Capecitabine before 10/2009, for an unclear duration and with unclear results (cannot locate data on chart review).  (11) Status post right supraclavicular lymph node biopsy 09/2010 again positive for an invasive ductal carcinoma, estrogen and progesterone receptor negative, HER-2 positive by [facility] with a ratio 4.25.  (12) Navelbine given together with Herceptin between 05/2011 and 11/2011.  (13) Carboplatin/ Gemcitabine/ Herceptin given for 2 cycles, in 12/2011 and 01/2012.  (14) TDM-1 ([place]) started 02/2012. Last dose 10/02/2013 after which the patient discontinued treatment at her own discretion. Echo on December 2014 showed a well preserved ejection fraction.  (15) Deep vein thrombosis of the right upper extremity documented 04/20/2011.  She completed anticoagulant therapy with Coumadin on 03/25/2013.  (16) Chronic right upper extremity lymphedema, not responsive to aggressive PT  (a) biopsy of denuded area  04/23/2015 read as dermatofibroma (discordant)  (b) deeper cuts of 04/23/2015 biopsy suggest angiosarcoma  (17) Right chest port-a-cath removal due to infection on 01/28/2013. Left chest Port-A-Cath placed on 04/08/2013; being flushed every 6 weeks  RIGHT UPPER EXTREMITY ANGIOSARCOMA VS BREAST CANCER: (18) treated at [facility] [place] August 2016 with paclitaxel, trastuzumab and pertuzumab 1 cycle, afterwards referred to hospice  (19) under the care of hospice of [facility] August 2016 to 05/08/2016, when the patient opted for a second try at chemotherapy  (20) started low-dose Abraxane, trastuzumab and pertuzumab 06/07/2016, to be repeated every 3 weeks.   (a) pretreatment echocardiogram 06/06/2016 showed a 60-65% ejection fraction  (b) significant compliance problems compromise response to treatment  (21) Chronic pain secondary to known metastatic disease  (a) patient signed pain contract 10/19/2016  (b) as of 11/16/2016 receives her pain medications from Dr [name]   PLAN: Lorann is recovering from her pneumonia. Her left posterior chest wall nodules, which she calls lymph nodes, have increased. We do need to get on with treatment. I suggested that she get treated with chemotherapy today and that we add the anti-HER-2 treatments next week, but she would like a week to "recuperate" so we are not going to start chemotherapy again until 02/15/2012.  She has not had an echo since July. I explained if we don't have an echo I cannot give her anti-HER-2 treatment. He is scheduled for an echo on 02/06/2017 and hopefully she will get that done.  It is remarkable to me how well Sheresa looks and how few symptoms she has from her metastatic breast cancer and sarcoma. I think if we can get her treatments and every 3 weeks as planned we can actually make progress.  She knows to call for any problems that may develop before her next visit.  Chauncey Cruel, MD 01/31/2017 10:41 AM

## 2017-02-06 ENCOUNTER — Other Ambulatory Visit (HOSPITAL_COMMUNITY): Payer: Commercial Managed Care - PPO

## 2017-02-14 ENCOUNTER — Other Ambulatory Visit: Payer: Commercial Managed Care - PPO

## 2017-02-14 ENCOUNTER — Ambulatory Visit: Payer: Commercial Managed Care - PPO

## 2017-02-16 ENCOUNTER — Telehealth: Payer: Self-pay | Admitting: Oncology

## 2017-02-16 NOTE — Telephone Encounter (Signed)
sw pt to confirm 4/17 appt at 1015 per LOS

## 2017-02-20 ENCOUNTER — Telehealth: Payer: Self-pay | Admitting: Oncology

## 2017-02-20 ENCOUNTER — Ambulatory Visit (HOSPITAL_COMMUNITY): Payer: Commercial Managed Care - PPO | Attending: Oncology

## 2017-02-20 ENCOUNTER — Other Ambulatory Visit: Payer: Self-pay

## 2017-02-20 DIAGNOSIS — I071 Rheumatic tricuspid insufficiency: Secondary | ICD-10-CM | POA: Diagnosis not present

## 2017-02-20 DIAGNOSIS — I351 Nonrheumatic aortic (valve) insufficiency: Secondary | ICD-10-CM | POA: Diagnosis not present

## 2017-02-20 DIAGNOSIS — C4711 Malignant neoplasm of peripheral nerves of right upper limb, including shoulder: Secondary | ICD-10-CM | POA: Diagnosis not present

## 2017-02-20 DIAGNOSIS — Z170 Estrogen receptor positive status [ER+]: Secondary | ICD-10-CM

## 2017-02-20 DIAGNOSIS — Z853 Personal history of malignant neoplasm of breast: Secondary | ICD-10-CM | POA: Insufficient documentation

## 2017-02-20 DIAGNOSIS — C4911 Malignant neoplasm of connective and soft tissue of right upper limb, including shoulder: Secondary | ICD-10-CM | POA: Diagnosis not present

## 2017-02-20 DIAGNOSIS — C50211 Malignant neoplasm of upper-inner quadrant of right female breast: Secondary | ICD-10-CM | POA: Diagnosis not present

## 2017-02-20 DIAGNOSIS — I10 Essential (primary) hypertension: Secondary | ICD-10-CM | POA: Insufficient documentation

## 2017-02-20 DIAGNOSIS — I340 Nonrheumatic mitral (valve) insufficiency: Secondary | ICD-10-CM | POA: Insufficient documentation

## 2017-02-20 NOTE — Telephone Encounter (Signed)
lvm to inform pt of r/s appt to 4/19 per telephone message 4/16

## 2017-02-21 ENCOUNTER — Other Ambulatory Visit: Payer: Commercial Managed Care - PPO

## 2017-02-21 ENCOUNTER — Ambulatory Visit: Payer: Commercial Managed Care - PPO | Admitting: Adult Health

## 2017-02-21 ENCOUNTER — Ambulatory Visit: Payer: Commercial Managed Care - PPO

## 2017-02-23 ENCOUNTER — Other Ambulatory Visit: Payer: Self-pay | Admitting: Oncology

## 2017-02-23 ENCOUNTER — Ambulatory Visit: Payer: Commercial Managed Care - PPO

## 2017-02-23 ENCOUNTER — Ambulatory Visit (HOSPITAL_BASED_OUTPATIENT_CLINIC_OR_DEPARTMENT_OTHER): Payer: Commercial Managed Care - PPO | Admitting: Adult Health

## 2017-02-23 ENCOUNTER — Other Ambulatory Visit (HOSPITAL_BASED_OUTPATIENT_CLINIC_OR_DEPARTMENT_OTHER): Payer: Commercial Managed Care - PPO

## 2017-02-23 ENCOUNTER — Ambulatory Visit (HOSPITAL_BASED_OUTPATIENT_CLINIC_OR_DEPARTMENT_OTHER): Payer: Commercial Managed Care - PPO

## 2017-02-23 VITALS — BP 122/54 | HR 75 | Temp 97.7°F | Resp 18 | Wt 154.9 lb

## 2017-02-23 DIAGNOSIS — G893 Neoplasm related pain (acute) (chronic): Secondary | ICD-10-CM | POA: Diagnosis not present

## 2017-02-23 DIAGNOSIS — C50211 Malignant neoplasm of upper-inner quadrant of right female breast: Secondary | ICD-10-CM | POA: Diagnosis not present

## 2017-02-23 DIAGNOSIS — Z5111 Encounter for antineoplastic chemotherapy: Secondary | ICD-10-CM

## 2017-02-23 DIAGNOSIS — C778 Secondary and unspecified malignant neoplasm of lymph nodes of multiple regions: Secondary | ICD-10-CM | POA: Diagnosis not present

## 2017-02-23 DIAGNOSIS — I890 Lymphedema, not elsewhere classified: Secondary | ICD-10-CM

## 2017-02-23 DIAGNOSIS — C4911 Malignant neoplasm of connective and soft tissue of right upper limb, including shoulder: Secondary | ICD-10-CM

## 2017-02-23 DIAGNOSIS — Z5112 Encounter for antineoplastic immunotherapy: Secondary | ICD-10-CM

## 2017-02-23 DIAGNOSIS — Z86718 Personal history of other venous thrombosis and embolism: Secondary | ICD-10-CM | POA: Diagnosis not present

## 2017-02-23 DIAGNOSIS — Z95828 Presence of other vascular implants and grafts: Secondary | ICD-10-CM

## 2017-02-23 DIAGNOSIS — Z170 Estrogen receptor positive status [ER+]: Secondary | ICD-10-CM | POA: Diagnosis not present

## 2017-02-23 LAB — CBC WITH DIFFERENTIAL/PLATELET
BASO%: 2.1 % — ABNORMAL HIGH (ref 0.0–2.0)
Basophils Absolute: 0.1 10*3/uL (ref 0.0–0.1)
EOS%: 2.8 % (ref 0.0–7.0)
Eosinophils Absolute: 0.2 10*3/uL (ref 0.0–0.5)
HCT: 40.7 % (ref 34.8–46.6)
HGB: 13.5 g/dL (ref 11.6–15.9)
LYMPH%: 19.6 % (ref 14.0–49.7)
MCH: 28.4 pg (ref 25.1–34.0)
MCHC: 33.1 g/dL (ref 31.5–36.0)
MCV: 85.6 fL (ref 79.5–101.0)
MONO#: 0.4 10*3/uL (ref 0.1–0.9)
MONO%: 7.8 % (ref 0.0–14.0)
NEUT#: 3.8 10*3/uL (ref 1.5–6.5)
NEUT%: 67.7 % (ref 38.4–76.8)
Platelets: 220 10*3/uL (ref 145–400)
RBC: 4.76 10*6/uL (ref 3.70–5.45)
RDW: 16.4 % — ABNORMAL HIGH (ref 11.2–14.5)
WBC: 5.6 10*3/uL (ref 3.9–10.3)
lymph#: 1.1 10*3/uL (ref 0.9–3.3)

## 2017-02-23 LAB — COMPREHENSIVE METABOLIC PANEL
ALT: 9 U/L (ref 0–55)
AST: 22 U/L (ref 5–34)
Albumin: 4 g/dL (ref 3.5–5.0)
Alkaline Phosphatase: 88 U/L (ref 40–150)
Anion Gap: 12 mEq/L — ABNORMAL HIGH (ref 3–11)
BUN: 16.2 mg/dL (ref 7.0–26.0)
CO2: 28 mEq/L (ref 22–29)
Calcium: 9.9 mg/dL (ref 8.4–10.4)
Chloride: 101 mEq/L (ref 98–109)
Creatinine: 0.9 mg/dL (ref 0.6–1.1)
EGFR: 77 mL/min/{1.73_m2} — ABNORMAL LOW (ref >90)
Glucose: 118 mg/dl (ref 70–140)
Potassium: 3.7 mEq/L (ref 3.5–5.1)
Sodium: 141 mEq/L (ref 136–145)
Total Bilirubin: 0.61 mg/dL (ref 0.20–1.20)
Total Protein: 7.8 g/dL (ref 6.4–8.3)

## 2017-02-23 MED ORDER — PROCHLORPERAZINE MALEATE 10 MG PO TABS
10.0000 mg | ORAL_TABLET | Freq: Once | ORAL | Status: AC
  Administered 2017-02-23: 10 mg via ORAL

## 2017-02-23 MED ORDER — HEPARIN SOD (PORK) LOCK FLUSH 100 UNIT/ML IV SOLN
500.0000 [IU] | Freq: Once | INTRAVENOUS | Status: AC | PRN
  Administered 2017-02-23: 500 [IU]
  Filled 2017-02-23: qty 5

## 2017-02-23 MED ORDER — SODIUM CHLORIDE 0.9 % IV SOLN
440.0000 mg | Freq: Once | INTRAVENOUS | Status: AC
  Administered 2017-02-23: 440 mg via INTRAVENOUS
  Filled 2017-02-23: qty 20.95

## 2017-02-23 MED ORDER — PROCHLORPERAZINE MALEATE 10 MG PO TABS
ORAL_TABLET | ORAL | Status: AC
  Filled 2017-02-23: qty 1

## 2017-02-23 MED ORDER — DIPHENHYDRAMINE HCL 25 MG PO CAPS
ORAL_CAPSULE | ORAL | Status: AC
  Filled 2017-02-23: qty 2

## 2017-02-23 MED ORDER — SODIUM CHLORIDE 0.9 % IJ SOLN
10.0000 mL | INTRAMUSCULAR | Status: DC | PRN
  Administered 2017-02-23: 10 mL via INTRAVENOUS
  Filled 2017-02-23: qty 10

## 2017-02-23 MED ORDER — ACETAMINOPHEN 325 MG PO TABS
650.0000 mg | ORAL_TABLET | Freq: Once | ORAL | Status: AC
  Administered 2017-02-23: 650 mg via ORAL

## 2017-02-23 MED ORDER — SODIUM CHLORIDE 0.9% FLUSH
10.0000 mL | INTRAVENOUS | Status: DC | PRN
  Administered 2017-02-23: 10 mL
  Filled 2017-02-23: qty 10

## 2017-02-23 MED ORDER — HEPARIN SOD (PORK) LOCK FLUSH 100 UNIT/ML IV SOLN
500.0000 [IU] | Freq: Once | INTRAVENOUS | Status: DC | PRN
Start: 2017-02-23 — End: 2017-02-23
  Filled 2017-02-23: qty 5

## 2017-02-23 MED ORDER — SODIUM CHLORIDE 0.9 % IV SOLN
Freq: Once | INTRAVENOUS | Status: AC
  Administered 2017-02-23: 13:00:00 via INTRAVENOUS

## 2017-02-23 MED ORDER — PACLITAXEL PROTEIN-BOUND CHEMO INJECTION 100 MG
125.0000 mg/m2 | Freq: Once | INTRAVENOUS | Status: AC
  Administered 2017-02-23: 250 mg via INTRAVENOUS
  Filled 2017-02-23: qty 50

## 2017-02-23 MED ORDER — ACETAMINOPHEN 325 MG PO TABS
ORAL_TABLET | ORAL | Status: AC
  Filled 2017-02-23: qty 2

## 2017-02-23 MED ORDER — DIPHENHYDRAMINE HCL 25 MG PO CAPS
25.0000 mg | ORAL_CAPSULE | Freq: Once | ORAL | Status: AC
Start: 2017-02-23 — End: 2017-02-23
  Administered 2017-02-23: 25 mg via ORAL
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
## 2017-02-23 NOTE — Patient Instructions (Signed)
[facility] Discharge Instructions for Patients Receiving Chemotherapy  Today you received the following chemotherapy agents :  Herceptin and Abraxane.  To help prevent nausea and vomiting after your treatment, we encourage you to take your nausea medication as prescribed.  Take Zofran 8 mg by mouth every 12 hours as needed for nausea.  Can also take Compazine 10 mg by mouth every 6 hours as needed for nausea.  DO NOT Drive after taking Compazine as it can cause drowsiness.  DO NOT Eat  Greasy  Nor  Spicy  Foods.  DO Drink  Lots  Of  Fluids  As  Tolerated.   If you develop nausea and vomiting that is not controlled by your nausea medication, call the clinic.   BELOW ARE SYMPTOMS THAT SHOULD BE REPORTED IMMEDIATELY:  *FEVER GREATER THAN 100.5 F  *CHILLS WITH OR WITHOUT FEVER  NAUSEA AND VOMITING THAT IS NOT CONTROLLED WITH YOUR NAUSEA MEDICATION  *UNUSUAL SHORTNESS OF BREATH  *UNUSUAL BRUISING OR BLEEDING  TENDERNESS IN MOUTH AND THROAT WITH OR WITHOUT PRESENCE OF ULCERS  *URINARY PROBLEMS  *BOWEL PROBLEMS  UNUSUAL RASH Items with * indicate a potential emergency and should be followed up as soon as possible.  Feel free to call the clinic you have any questions or concerns. The clinic phone number is [phone].  Please show the [facility] at check-in to the Emergency Department and triage nurse.

## 2017-02-23 NOTE — Patient Instructions (Signed)

## 2017-02-24 ENCOUNTER — Encounter: Payer: Self-pay | Admitting: Adult Health

## 2017-03-07 ENCOUNTER — Other Ambulatory Visit (HOSPITAL_BASED_OUTPATIENT_CLINIC_OR_DEPARTMENT_OTHER): Payer: Commercial Managed Care - PPO

## 2017-03-07 ENCOUNTER — Ambulatory Visit: Payer: Commercial Managed Care - PPO

## 2017-03-07 ENCOUNTER — Ambulatory Visit (HOSPITAL_BASED_OUTPATIENT_CLINIC_OR_DEPARTMENT_OTHER): Payer: Commercial Managed Care - PPO | Admitting: Oncology

## 2017-03-07 ENCOUNTER — Ambulatory Visit (HOSPITAL_BASED_OUTPATIENT_CLINIC_OR_DEPARTMENT_OTHER): Payer: Commercial Managed Care - PPO

## 2017-03-07 VITALS — BP 124/62 | HR 66 | Temp 98.2°F | Resp 20 | Wt 165.3 lb

## 2017-03-07 DIAGNOSIS — C50211 Malignant neoplasm of upper-inner quadrant of right female breast: Secondary | ICD-10-CM | POA: Diagnosis not present

## 2017-03-07 DIAGNOSIS — C4911 Malignant neoplasm of connective and soft tissue of right upper limb, including shoulder: Secondary | ICD-10-CM

## 2017-03-07 DIAGNOSIS — I890 Lymphedema, not elsewhere classified: Secondary | ICD-10-CM

## 2017-03-07 DIAGNOSIS — C778 Secondary and unspecified malignant neoplasm of lymph nodes of multiple regions: Secondary | ICD-10-CM | POA: Diagnosis not present

## 2017-03-07 DIAGNOSIS — Z170 Estrogen receptor positive status [ER+]: Secondary | ICD-10-CM

## 2017-03-07 DIAGNOSIS — Z95828 Presence of other vascular implants and grafts: Secondary | ICD-10-CM

## 2017-03-07 LAB — CBC WITH DIFFERENTIAL/PLATELET
BASO%: 1 % (ref 0.0–2.0)
Basophils Absolute: 0 10*3/uL (ref 0.0–0.1)
EOS%: 3 % (ref 0.0–7.0)
Eosinophils Absolute: 0.1 10*3/uL (ref 0.0–0.5)
HCT: 36.1 % (ref 34.8–46.6)
HGB: 11.8 g/dL (ref 11.6–15.9)
LYMPH%: 38 % (ref 14.0–49.7)
MCH: 28.3 pg (ref 25.1–34.0)
MCHC: 32.7 g/dL (ref 31.5–36.0)
MCV: 86.6 fL (ref 79.5–101.0)
MONO#: 0.6 10*3/uL (ref 0.1–0.9)
MONO%: 12.3 % (ref 0.0–14.0)
NEUT#: 2.2 10*3/uL (ref 1.5–6.5)
NEUT%: 45.7 % (ref 38.4–76.8)
Platelets: 285 10*3/uL (ref 145–400)
RBC: 4.17 10*6/uL (ref 3.70–5.45)
RDW: 15.8 % — ABNORMAL HIGH (ref 11.2–14.5)
WBC: 4.7 10*3/uL (ref 3.9–10.3)
lymph#: 1.8 10*3/uL (ref 0.9–3.3)

## 2017-03-07 LAB — COMPREHENSIVE METABOLIC PANEL
ALT: 12 U/L (ref 0–55)
AST: 22 U/L (ref 5–34)
Albumin: 3.6 g/dL (ref 3.5–5.0)
Alkaline Phosphatase: 90 U/L (ref 40–150)
Anion Gap: 9 mEq/L (ref 3–11)
BUN: 12.4 mg/dL (ref 7.0–26.0)
CO2: 31 mEq/L — ABNORMAL HIGH (ref 22–29)
Calcium: 9.4 mg/dL (ref 8.4–10.4)
Chloride: 103 mEq/L (ref 98–109)
Creatinine: 0.9 mg/dL (ref 0.6–1.1)
EGFR: 81 mL/min/{1.73_m2} — ABNORMAL LOW (ref >90)
Glucose: 97 mg/dl (ref 70–140)
Potassium: 3.7 mEq/L (ref 3.5–5.1)
Sodium: 143 mEq/L (ref 136–145)
Total Bilirubin: 0.22 mg/dL (ref 0.20–1.20)
Total Protein: 7.3 g/dL (ref 6.4–8.3)

## 2017-03-07 MED ORDER — SODIUM CHLORIDE 0.9 % IJ SOLN
10.0000 mL | INTRAMUSCULAR | Status: DC | PRN
  Administered 2017-03-07: 10 mL via INTRAVENOUS
  Filled 2017-03-07: qty 10

## 2017-03-07 MED ORDER — HEPARIN SOD (PORK) LOCK FLUSH 100 UNIT/ML IV SOLN
500.0000 [IU] | Freq: Once | INTRAVENOUS | Status: AC | PRN
Start: 2017-03-07 — End: 2017-03-07
  Administered 2017-03-07: 500 [IU] via INTRAVENOUS
  Filled 2017-03-07: qty 5

## 2017-03-07 NOTE — Progress Notes (Signed)
[place]  Telephone:([phone]) [phone] Fax:([phone]) [phone]  OFFICE PROGRESS NOTE   ID: [name]   DOB: 62-Jun-1957  MR#: [id number]  CBJ#:[id number]   PCP: [name], MD [name], [name], [name] RAD HYW:[id number] [name], [name] OTHER:  [name], MD, [name]  CHIEF COMPLAINT:  Stage IV Breast Cancer, angiosarcoma  CURRENT TREATMENT: Abraxane, trastuzumab, pertuzumab  BREAST CANCER HISTORY: From the original intake note:  The patient originally presented in May 2003 when she noticed a lump in the upper inner quadrant of her right breast in September 2003.  She sought attention and had a mammogram which showed an obvious carcinoma in the upper inner quadrant of the right breast, approximately 2 cm.  There were some enlarged lymph nodes in the axilla and an FNA done showed those consistent with malignant cells, most likely an invasive ductal carcinoma.  At that point she was unsure of what to do and was referred by Dr. [name] for a discussion of her treatment options.  By biopsy, it was ER/PR negative and HER-2 was 3+.  DNA index was 1.42.  We reiterated that it was most important to have her disease surgically addressed at that point and had recommended lumpectomy and axillary nodes to be addressed with which she followed through and on 04-03-02, Dr. [name] performed a right partial mastectomy and a right axillary lymph node dissection.  Final pathology revealed a 2.4 cm. high grade, Grade III invasive ductal carcinoma with an adjacent .8 cm. also high grade invasive ductal carcinoma which was felt to represent an intramammary metastasis rather than a second primary.  A smaller mass was just medial to the larger mass.  The smaller mass was associated with high grade DCIS component.  There was no definite lymphovascular invasion identified.  However, one of fourteen axillary lymph nodes did contain a 1.5 cm. metastatic  deposit.  Postoperatively she did very well.  We reiterated the fact that she did need adjuvant chemotherapy, however, she refused and decided that she would pursue radiation.  She received radiation and completed that on 08-14-02. Her subsequent history is as detailed below.   INTERVAL HISTORY: [name] returns today for follow-up of her metastatic breast cancer and angiosarcoma tumors. She was seen and treated 02/23/2017. Before that she had not been treated for about 2 months. She has developed some open areas on her back and these are worrying her. Accordingly she is now more motivated to be treated.  Her pain medication is managed by her primary care physician, Dr. [name]. She received 60 oxycodone/Tylenol tablets most recently 02/23/2017. She did not request any pain medicine today.  REVIEW OF SYSTEMS: [name] tells me her daughter drives the good car on Tuesdays and Thursdays. [name] is driving an old car. It breaks down frequently. At home it's her daughter and her daughter's 3-year-old son. The patient's husband is home on weekends. Her son also sometimes is in on weekends. She has had some nausea but no vomiting. She says her appetite is a little bit down but she eats a lot of nights she says not during the day. She is drinking some boost. She denies constipation. They have been no unusual headaches, visual changes, dizziness, or gait imbalance. A detailed review of systems today was otherwise stable.  PAST MEDICAL HISTORY: Past Medical History:  Diagnosis Date  . Breast cancer ([place])   . DVT (deep venous thrombosis) ([place]) 10/07/2011  . Hypertension   . Radiation 11/29/2005-12/29/2005  right supraclavicular area 4147 cGy  . Radiation 06/26/02-08/14/02   right breast 5040 cGy, tumor bed boosted to 1260 cGy  . Rheumatic fever     PAST SURGICAL HISTORY: Past Surgical History:  Procedure Laterality Date  . BREAST SURGERY    . MASTECTOMY     bilateral  . PORT A CATH REVISION        FAMILY HISTORY Family History  Problem Relation Age of Onset  . Pancreatic cancer Mother   . Kidney cancer Sister 80  . Breast cancer Sister 53  . Breast cancer Other 13  The patient's father died in his 62A  from complications of alcohol abuse. The patient's mother died in her 98s from pancreatic cancer. The patient has 6 brothers, 2 sisters. Both her sisters have had breast cancer, both diagnosed after the age of 27. The patient has not had genetic testing so far.   GYNECOLOGIC HISTORY: Menarche age 46; first live birth age 29. She is GXP4. She is not sure when she stopped having periods. She never used hormone replacement.  SOCIAL HISTORY: The patient has worked in the past as a Psychologist, counselling. At home in addition to the patient are her husband [name], originally from [place], who works as a Administrator.  Also living in the home is her son [name] and her daughter [name] (she is studying to be a Marine scientist).  The other 2 children are [name], who has a college degree in psychology and works as a Probation officer; and [name], who lives in [place] and works as a Higher education careers adviser.   ADVANCED DIRECTIVES: Not in place  HEALTH MAINTENANCE: Social History  Substance Use Topics  . Smoking status: Light Tobacco Smoker  . Smokeless tobacco: Never Used  . Alcohol use Yes     Comment: Rarely     Colonoscopy:  Not on file  PAP:  Not on file  Bone density:  Not on file  Lipid panel:  Not on file    Allergies  Allergen Reactions  . Pork-Derived Products Other (See Comments)    Pt says she just doesn't eat pork; has no reaction to pork products  . Tramadol Nausea Only  . Hydrocodone-Acetaminophen Itching and Rash    Current Outpatient Prescriptions  Medication Sig Dispense Refill  . alprazolam (XANAX) 2 MG tablet Take 2 mg by mouth daily.     . Ascorbic Acid (VITAMIN C) 100 MG tablet Take 100 mg by mouth daily.      . calcium-vitamin D (OSCAL WITH D) 500-200 MG-UNIT per tablet Take 1  tablet by mouth daily.      Marland Kitchen gabapentin (NEURONTIN) 300 MG capsule Take 1 capsule by mouth daily.    . Multiple Vitamin (MULTIVITAMIN) capsule Take 1 capsule by mouth daily.      Marland Kitchen oxyCODONE-acetaminophen (PERCOCET) 10-325 MG tablet Take 2 tablets by mouth every 4 (four) hours as needed.  0  . oxymorphone (OPANA) 10 MG tablet Take 1 tablet by mouth 2 (two) times daily.  0  . prochlorperazine (COMPAZINE) 10 MG tablet Take 1 tablet by mouth every 6 (six) hours as needed.    . sertraline (ZOLOFT) 50 MG tablet Take 1 tablet (50 mg total) by mouth daily. 30 tablet 3  . triamterene-hydrochlorothiazide (DYAZIDE) 37.5-25 MG capsule Take 1 each (1 capsule total) by mouth every morning. 30 capsule 3   No current facility-administered medications for this visit.    Facility-Administered Medications Ordered in Other Visits  Medication Dose Route Frequency Provider Last Rate  Last Dose  . sodium chloride 0.9 % injection 10 mL  10 mL Intravenous PRN Chauncey Cruel, MD   10 mL at 03/04/14 1421    OBJECTIVE: Middle-aged African-American woman appears chronically ill  Vitals:   03/07/17 1042  BP: 124/62  Pulse: 66  Resp: 20  Temp: 98.2 F (36.8 C)     Body mass index is 26.68 kg/m.    ECOG FS: 1 Filed Weights   03/07/17 1042  Weight: 165 lb 4.8 oz (75 kg)    Sclerae unicteric, EOMs intact Oropharynx clear and moist Lungs no rales or rhonchi Heart regular rate and rhythm Abd soft, nontender, positive bowel sounds MSK no focal spinal tenderness Neuro: nonfocal, well oriented, appropriate affect Breasts: The patient's back and posterior right upper extremity are imaged below.   Photo right back 01/31/2017   Photo 03/07/2018      LAB RESULTS:.  Lab Results  Component Value Date   WBC 4.7 03/07/2017   NEUTROABS 2.2 03/07/2017   HGB 11.8 03/07/2017   HCT 36.1 03/07/2017   MCV 86.6 03/07/2017   PLT 285 03/07/2017      Chemistry      Component Value Date/Time   NA 143  03/07/2017 0958   K 3.7 03/07/2017 0958   CL 105 01/24/2017 0657   CL 101 03/07/2013 1411   CO2 31 (H) 03/07/2017 0958   BUN 12.4 03/07/2017 0958   CREATININE 0.9 03/07/2017 0958      Component Value Date/Time   CALCIUM 9.4 03/07/2017 0958   ALKPHOS 90 03/07/2017 0958   AST 22 03/07/2017 0958   ALT 12 03/07/2017 0958   BILITOT 0.22 03/07/2017 0958       STUDIES: No results found.   ASSESSMENT:  61 y.o. [place] woman with stage IV breast cancer manifested chiefly by loco-regional nodal disease (neck, chest), without lung, liver, bone, or brain involvement.  (1) Status post right upper inner quadrant lumpectomy and sentinel lymph node sampling 03/04/2002 for 2 separate foci of invasive ductal carcinoma, mpT2 pN1 or stage IIB, both foci grade 3, both estrogen and progesterone receptor positive, both HercepTest 3+, Mib-1 56%  (2) Reexcision for margins 05/27/2002 showed no residual cancer in the breast.  (3) The patient refused adjuvant systemic therapy.  (4) Adjuvant radiation treatment completed 08/14/2002.  (5) recurrence in the right breast in 02/2004 showing a morphologically different tumor, again grade 3, again estrogen and progesterone receptor negative, with an MIB-1 of 14% and Herceptest 3+.  (5) Between 03/2004 and 07/2004 she received dose dense Doxorubicin/Cyclophosphamide x 4 given with trastuzumab, followed by weekly Docetaxel x 8, again given with trastuzumab.  (6) Right mastectomy 07/13/2004 showed scattered microscopic foci of residual disease over an area  greater than 5 cm. Margins were negative.  (7) Postoperative Docetaxel continued until 09/2004.  (8) Trastuzumab (Herceptin) given 08/2004 through 01/2012 with some brief interruptions.  (9) Isolated right cervical nodal recurrence 10/2005, treated with radiation to the right supraclavicular area (total 41.5 gray) completed 12/29/2005.  (10) Navelbine given together with Herceptin  11/2005 through  03/2006.  (9) Left mastectomy 02/13/2006 for ductal carcinoma in situ, grade 2, estrogen and progesterone receptor negative, with negative margins; 0 of 3 lymph nodes involved  (10) treated with Lapatinib and Capecitabine before 10/2009, for an unclear duration and with unclear results (cannot locate data on chart review).  (11) Status post right supraclavicular lymph node biopsy 09/2010 again positive for an invasive ductal carcinoma, estrogen and progesterone receptor negative,  HER-2 positive by [facility] with a ratio 4.25.  (12) Navelbine given together with Herceptin between 05/2011 and 11/2011.  (13) Carboplatin/ Gemcitabine/ Herceptin given for 2 cycles, in 12/2011 and 01/2012.  (14) TDM-1 ([place]) started 02/2012. Last dose 10/02/2013 after which the patient discontinued treatment at her own discretion. Echo on December 2014 showed a well preserved ejection fraction.  (15) Deep vein thrombosis of the right upper extremity documented 04/20/2011.  She completed anticoagulant therapy with Coumadin on 03/25/2013.  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 Left chest Port-A-Cath placed on 04/08/2013; being flushed every 6 weeks  RIGHT UPPER EXTREMITY ANGIOSARCOMA VS BREAST CANCER: (18) treated at [facility] [place] August 2016 with paclitaxel, trastuzumab and pertuzumab 1 cycle, afterwards referred to hospice  (19) under the care of hosp[facility] of [facility] August 2016 to 05/08/2016, when the patient opted for a second try at chemotherapy  (20) started low-dose Abraxane, trastuzumab and pertuzumab 06/07/2016, to be repeated every 3 weeks.   (a) pretreatment echocardiogram 06/06/2016 showed a 60-65% ejection fraction  (b) significant compliance  problems compromise response to treatment  (c) last echocardiogram 02/20/17 LVEF 55-60%  (21) Chronic pain secondary to known metastatic disease  (a) patient signed pain contract 10/19/2016  (b) as of 11/16/2016 receives her pain medications from Dr [name]   PLAN: Ilea has had some progression of the cancer on her back. This is concerning her. She understands she had not had any treatment for about 2 months she now tells me she "will take" the next several appointments. I have gone ahead and scheduled her for her next treatment May 10 and every 21 days for the next 3 treatments. And I have scheduled her to see me with her June therapy.  There are transportation issues. I have asked her to meet with our social worker but she tells me she is going to take care of it on her own.  She does not want to me with my 89 assistant who she says was to direct and negative. She understands that she will have to meet with her at some point when I am not available.  Overall Annalysa's situation is difficult. If she can manage to make it here every 3 weeks we will treat her and if we treated her consistently her cancer I do believe will get better.  She knows to call for any other issues that may develop before her next visit.    Chauncey Cruel, MD 03/07/2017 11:01 AM

## 2017-03-16 ENCOUNTER — Telehealth: Payer: Self-pay

## 2017-03-16 ENCOUNTER — Ambulatory Visit: Payer: Commercial Managed Care - PPO

## 2017-03-16 ENCOUNTER — Other Ambulatory Visit (HOSPITAL_BASED_OUTPATIENT_CLINIC_OR_DEPARTMENT_OTHER): Payer: Commercial Managed Care - PPO

## 2017-03-16 ENCOUNTER — Ambulatory Visit (HOSPITAL_BASED_OUTPATIENT_CLINIC_OR_DEPARTMENT_OTHER): Payer: Commercial Managed Care - PPO

## 2017-03-16 ENCOUNTER — Other Ambulatory Visit: Payer: Self-pay

## 2017-03-16 VITALS — BP 142/88 | HR 61 | Temp 99.1°F | Resp 16

## 2017-03-16 DIAGNOSIS — Z5112 Encounter for antineoplastic immunotherapy: Secondary | ICD-10-CM | POA: Diagnosis not present

## 2017-03-16 DIAGNOSIS — Z5111 Encounter for antineoplastic chemotherapy: Secondary | ICD-10-CM

## 2017-03-16 DIAGNOSIS — C50211 Malignant neoplasm of upper-inner quadrant of right female breast: Secondary | ICD-10-CM | POA: Diagnosis not present

## 2017-03-16 DIAGNOSIS — C4911 Malignant neoplasm of connective and soft tissue of right upper limb, including shoulder: Secondary | ICD-10-CM

## 2017-03-16 DIAGNOSIS — Z95828 Presence of other vascular implants and grafts: Secondary | ICD-10-CM

## 2017-03-16 DIAGNOSIS — C778 Secondary and unspecified malignant neoplasm of lymph nodes of multiple regions: Secondary | ICD-10-CM

## 2017-03-16 LAB — COMPREHENSIVE METABOLIC PANEL
ALT: 13 U/L (ref 0–55)
AST: 23 U/L (ref 5–34)
Albumin: 4 g/dL (ref 3.5–5.0)
Alkaline Phosphatase: 99 U/L (ref 40–150)
Anion Gap: 11 mEq/L (ref 3–11)
BUN: 9.1 mg/dL (ref 7.0–26.0)
CO2: 28 mEq/L (ref 22–29)
Calcium: 10 mg/dL (ref 8.4–10.4)
Chloride: 103 mEq/L (ref 98–109)
Creatinine: 0.9 mg/dL (ref 0.6–1.1)
EGFR: 80 mL/min/{1.73_m2} — ABNORMAL LOW (ref >90)
Glucose: 114 mg/dl (ref 70–140)
Potassium: 3.4 mEq/L — ABNORMAL LOW (ref 3.5–5.1)
Sodium: 142 mEq/L (ref 136–145)
Total Bilirubin: 0.4 mg/dL (ref 0.20–1.20)
Total Protein: 7.8 g/dL (ref 6.4–8.3)

## 2017-03-16 LAB — CBC WITH DIFFERENTIAL/PLATELET
BASO%: 1 % (ref 0.0–2.0)
Basophils Absolute: 0.1 10*3/uL (ref 0.0–0.1)
EOS%: 0.5 % (ref 0.0–7.0)
Eosinophils Absolute: 0 10*3/uL (ref 0.0–0.5)
HCT: 39.9 % (ref 34.8–46.6)
HGB: 13.3 g/dL (ref 11.6–15.9)
LYMPH%: 14.1 % (ref 14.0–49.7)
MCH: 28.6 pg (ref 25.1–34.0)
MCHC: 33.3 g/dL (ref 31.5–36.0)
MCV: 86.1 fL (ref 79.5–101.0)
MONO#: 0.6 10*3/uL (ref 0.1–0.9)
MONO%: 8 % (ref 0.0–14.0)
NEUT#: 5.3 10*3/uL (ref 1.5–6.5)
NEUT%: 76.4 % (ref 38.4–76.8)
Platelets: 279 10*3/uL (ref 145–400)
RBC: 4.63 10*6/uL (ref 3.70–5.45)
RDW: 16.3 % — ABNORMAL HIGH (ref 11.2–14.5)
WBC: 7 10*3/uL (ref 3.9–10.3)
lymph#: 1 10*3/uL (ref 0.9–3.3)

## 2017-03-16 MED ORDER — ACETAMINOPHEN 325 MG PO TABS
650.0000 mg | ORAL_TABLET | Freq: Once | ORAL | Status: AC
  Administered 2017-03-16: 650 mg via ORAL

## 2017-03-16 MED ORDER — SODIUM CHLORIDE 0.9% FLUSH
10.0000 mL | INTRAVENOUS | Status: DC | PRN
  Administered 2017-03-16: 10 mL
  Filled 2017-03-16: qty 10

## 2017-03-16 MED ORDER — PROCHLORPERAZINE MALEATE 10 MG PO TABS
ORAL_TABLET | ORAL | Status: AC
  Filled 2017-03-16: qty 1

## 2017-03-16 MED ORDER — PROCHLORPERAZINE MALEATE 10 MG PO TABS
10.0000 mg | ORAL_TABLET | Freq: Once | ORAL | Status: AC
  Administered 2017-03-16: 10 mg via ORAL

## 2017-03-16 MED ORDER — DIPHENHYDRAMINE HCL 25 MG PO CAPS
ORAL_CAPSULE | ORAL | Status: AC
  Filled 2017-03-16: qty 2

## 2017-03-16 MED ORDER — METHADONE HCL 5 MG PO TABS: 5.0000 mg | ORAL_TABLET | Freq: Three times a day (TID) | ORAL | 0 refills | Status: DC

## 2017-03-16 MED ORDER — ACETAMINOPHEN 325 MG PO TABS
ORAL_TABLET | ORAL | Status: AC
  Filled 2017-03-16: qty 2

## 2017-03-16 MED ORDER — HEPARIN SOD (PORK) LOCK FLUSH 100 UNIT/ML IV SOLN
500.0000 [IU] | Freq: Once | INTRAVENOUS | Status: AC | PRN
  Administered 2017-03-16: 500 [IU]
  Filled 2017-03-16: qty 5

## 2017-03-16 MED ORDER — SODIUM CHLORIDE 0.9 % IJ SOLN
10.0000 mL | INTRAMUSCULAR | Status: DC | PRN
  Administered 2017-03-16: 10 mL via INTRAVENOUS
  Filled 2017-03-16: qty 10

## 2017-03-16 MED ORDER — SODIUM CHLORIDE 0.9 % IV SOLN
Freq: Once | INTRAVENOUS | Status: AC
  Administered 2017-03-16: 15:00:00 via INTRAVENOUS

## 2017-03-16 MED ORDER — TRASTUZUMAB CHEMO 150 MG IV SOLR
450.0000 mg | Freq: Once | INTRAVENOUS | Status: AC
  Administered 2017-03-16: 450 mg via INTRAVENOUS
  Filled 2017-03-16: qty 21.43

## 2017-03-16 MED ORDER — PACLITAXEL PROTEIN-BOUND CHEMO INJECTION 100 MG
125.0000 mg/m2 | Freq: Once | INTRAVENOUS | Status: AC
  Administered 2017-03-16: 250 mg via INTRAVENOUS
  Filled 2017-03-16: qty 50

## 2017-03-16 MED ORDER — DIPHENHYDRAMINE HCL 25 MG PO CAPS
25.0000 mg | ORAL_CAPSULE | Freq: Once | ORAL | Status: AC
  Administered 2017-03-16: 25 mg via ORAL

## 2017-03-16 NOTE — Telephone Encounter (Signed)
Per Dr [name]'s request, msg sent to scheduling to schedule pt to see Wilber Bihari, NP on 5.31 prior to treatment that day to f/u on new script for methadone

## 2017-03-16 NOTE — Patient Instructions (Signed)
Trastuzumab injection for infusion What is this medicine? TRASTUZUMAB (tras TOO zoo mab) is a monoclonal antibody. It is used to treat breast cancer and stomach cancer. This medicine may be used for other purposes; ask your health care provider or pharmacist if you have questions. COMMON BRAND NAME(S): Herceptin What should I tell my health care provider before I take this medicine? They need to know if you have any of these conditions: -heart disease -heart failure -lung or breathing disease, like asthma -an unusual or allergic reaction to trastuzumab, benzyl alcohol, or other medications, foods, dyes, or preservatives -pregnant or trying to get pregnant -breast-feeding How should I use this medicine? This drug is given as an infusion into a vein. It is administered in a hospital or clinic by a specially trained health care professional. Talk to your pediatrician regarding the use of this medicine in children. This medicine is not approved for use in children. Overdosage: If you think you have taken too much of this medicine contact a poison control center or emergency room at once. NOTE: This medicine is only for you. Do not share this medicine with others. What if I miss a dose? It is important not to miss a dose. Call your doctor or health care professional if you are unable to keep an appointment. What may interact with this medicine? This medicine may interact with the following medications: -certain types of chemotherapy, such as daunorubicin, doxorubicin, epirubicin, and idarubicin This list may not describe all possible interactions. Give your health care provider a list of all the medicines, herbs, non-prescription drugs, or dietary supplements you use. Also tell them if you smoke, drink alcohol, or use illegal drugs. Some items may interact with your medicine. What should I watch for while using this medicine? Visit your doctor for checks on your progress. Report any side effects.  Continue your course of treatment even though you feel ill unless your doctor tells you to stop. Call your doctor or health care professional for advice if you get a fever, chills or sore throat, or other symptoms of a cold or flu. Do not treat yourself. Try to avoid being around people who are sick. You may experience fever, chills and shaking during your first infusion. These effects are usually mild and can be treated with other medicines. Report any side effects during the infusion to your health care professional. Fever and chills usually do not happen with later infusions. Do not become pregnant while taking this medicine or for 7 months after stopping it. Women should inform their doctor if they wish to become pregnant or think they might be pregnant. Women of child-bearing potential will need to have a negative pregnancy test before starting this medicine. There is a potential for serious side effects to an unborn child. Talk to your health care professional or pharmacist for more information. Do not breast-feed an infant while taking this medicine or for 7 months after stopping it. Women must use effective birth control with this medicine. What side effects may I notice from receiving this medicine? Side effects that you should report to your doctor or health care professional as soon as possible: -allergic reactions like skin rash, itching or hives, swelling of the face, lips, or tongue -chest pain or palpitations -cough -dizziness -feeling faint or lightheaded, falls -fever -general ill feeling or flu-like symptoms -signs of worsening heart failure like breathing problems; swelling in your legs and feet -unusually weak or tired Side effects that usually do not require medical   attention (report to your doctor or health care professional if they continue or are bothersome): -bone pain -changes in taste -diarrhea -joint pain -nausea/vomiting -weight loss This list may not describe all  possible side effects. Call your doctor for medical advice about side effects. You may report side effects to FDA at 1-800-FDA-1088. Where should I keep my medicine? This drug is given in a hospital or clinic and will not be stored at home. NOTE: This sheet is a summary. It may not cover all possible information. If you have questions about this medicine, talk to your doctor, pharmacist, or health care provider.  2018 Elsevier/Gold Standard (2016-10-18 14:37:52)   Nanoparticle Albumin-Bound Paclitaxel injection What is this medicine? NANOPARTICLE ALBUMIN-BOUND PACLITAXEL (Na no PAHR ti kuhl al BYOO muhn-bound PAK li TAX el) is a chemotherapy drug. It targets fast dividing cells, like cancer cells, and causes these cells to die. This medicine is used to treat advanced breast cancer and advanced lung cancer. This medicine may be used for other purposes; ask your health care provider or pharmacist if you have questions. COMMON BRAND NAME(S): Abraxane What should I tell my health care provider before I take this medicine? They need to know if you have any of these conditions: -kidney disease -liver disease -low blood counts, like low platelets, red blood cells, or white blood cells -recent or ongoing radiation therapy -an unusual or allergic reaction to paclitaxel, albumin, other chemotherapy, other medicines, foods, dyes, or preservatives -pregnant or trying to get pregnant -breast-feeding How should I use this medicine? This drug is given as an infusion into a vein. It is administered in a hospital or clinic by a specially trained health care professional. Talk to your pediatrician regarding the use of this medicine in children. Special care may be needed. Overdosage: If you think you have taken too much of this medicine contact a poison control center or emergency room at once. NOTE: This medicine is only for you. Do not share this medicine with others. What if I miss a dose? It is  important not to miss your dose. Call your doctor or health care professional if you are unable to keep an appointment. What may interact with this medicine? -cyclosporine -diazepam -ketoconazole -medicines to increase blood counts like filgrastim, pegfilgrastim, sargramostim -other chemotherapy drugs like cisplatin, doxorubicin, epirubicin, etoposide, teniposide, vincristine -quinidine -testosterone -vaccines -verapamil Talk to your doctor or health care professional before taking any of these medicines: -acetaminophen -aspirin -ibuprofen -ketoprofen -naproxen This list may not describe all possible interactions. Give your health care provider a list of all the medicines, herbs, non-prescription drugs, or dietary supplements you use. Also tell them if you smoke, drink alcohol, or use illegal drugs. Some items may interact with your medicine. What should I watch for while using this medicine? Your condition will be monitored carefully while you are receiving this medicine. You will need important blood work done while you are taking this medicine. This medicine can cause serious allergic reactions. If you experience allergic reactions like skin rash, itching or hives, swelling of the face, lips, or tongue, tell your doctor or health care professional right away. In some cases, you may be given additional medicines to help with side effects. Follow all directions for their use. This drug may make you feel generally unwell. This is not uncommon, as chemotherapy can affect healthy cells as well as cancer cells. Report any side effects. Continue your course of treatment even though you feel ill unless your doctor tells you  to stop. Call your doctor or health care professional for advice if you get a fever, chills or sore throat, or other symptoms of a cold or flu. Do not treat yourself. This drug decreases your body's ability to fight infections. Try to avoid being around people who are  sick. This medicine may increase your risk to bruise or bleed. Call your doctor or health care professional if you notice any unusual bleeding. Be careful brushing and flossing your teeth or using a toothpick because you may get an infection or bleed more easily. If you have any dental work done, tell your dentist you are receiving this medicine. Avoid taking products that contain aspirin, acetaminophen, ibuprofen, naproxen, or ketoprofen unless instructed by your doctor. These medicines may hide a fever. Do not become pregnant while taking this medicine. Women should inform their doctor if they wish to become pregnant or think they might be pregnant. There is a potential for serious side effects to an unborn child. Talk to your health care professional or pharmacist for more information. Do not breast-feed an infant while taking this medicine. Men are advised not to father a child while receiving this medicine. What side effects may I notice from receiving this medicine? Side effects that you should report to your doctor or health care professional as soon as possible: -allergic reactions like skin rash, itching or hives, swelling of the face, lips, or tongue -low blood counts - This drug may decrease the number of white blood cells, red blood cells and platelets. You may be at increased risk for infections and bleeding. -signs of infection - fever or chills, cough, sore throat, pain or difficulty passing urine -signs of decreased platelets or bleeding - bruising, pinpoint red spots on the skin, black, tarry stools, nosebleeds -signs of decreased red blood cells - unusually weak or tired, fainting spells, lightheadedness -breathing problems -changes in vision -chest pain -high or low blood pressure -mouth sores -nausea and vomiting -pain, swelling, redness or irritation at the injection site -pain, tingling, numbness in the hands or feet -slow or irregular heartbeat -swelling of the ankle,  feet, hands Side effects that usually do not require medical attention (report to your doctor or health care professional if they continue or are bothersome): -aches, pains -changes in the color of fingernails -diarrhea -hair loss -loss of appetite This list may not describe all possible side effects. Call your doctor for medical advice about side effects. You may report side effects to FDA at 1-800-FDA-1088. Where should I keep my medicine? This drug is given in a hospital or clinic and will not be stored at home. NOTE: This sheet is a summary. It may not cover all possible information. If you have questions about this medicine, talk to your doctor, pharmacist, or health care provider.  2018 Elsevier/Gold Standard (2015-08-26 10:05:20)    [facility] Discharge Instructions for Patients Receiving Chemotherapy  Today you received the following chemotherapy agents :  Herceptin and Abraxane.  To help prevent nausea and vomiting after your treatment, we encourage you to take your nausea medication as prescribed.  Take Zofran 8 mg by mouth every 12 hours as needed for nausea.  Can also take Compazine 10 mg by mouth every 6 hours as needed for nausea.  DO NOT Drive after taking Compazine as it can cause drowsiness.  DO NOT Eat  Greasy  Nor  Spicy  Foods.  DO Drink  Lots  Of  Fluids  As  Tolerated.  If you develop nausea and vomiting that is not controlled by your nausea medication, call the clinic.   BELOW ARE SYMPTOMS THAT SHOULD BE REPORTED IMMEDIATELY:  *FEVER GREATER THAN 100.5 F  *CHILLS WITH OR WITHOUT FEVER  NAUSEA AND VOMITING THAT IS NOT CONTROLLED WITH YOUR NAUSEA MEDICATION  *UNUSUAL SHORTNESS OF BREATH  *UNUSUAL BRUISING OR BLEEDING  TENDERNESS IN MOUTH AND THROAT WITH OR WITHOUT PRESENCE OF ULCERS  *URINARY PROBLEMS  *BOWEL PROBLEMS  UNUSUAL RASH Items with * indicate a potential emergency and should be followed up as soon as possible.  Feel free to  call the clinic you have any questions or concerns. The clinic phone number is [phone].  Please show the [place] at check-in to the Emergency Department and triage nurse.

## 2017-03-16 NOTE — Patient Instructions (Signed)

## 2017-03-28 ENCOUNTER — Ambulatory Visit: Payer: Commercial Managed Care - PPO

## 2017-03-28 ENCOUNTER — Other Ambulatory Visit: Payer: Commercial Managed Care - PPO

## 2017-03-29 ENCOUNTER — Other Ambulatory Visit: Payer: Self-pay | Admitting: *Deleted

## 2017-03-29 ENCOUNTER — Other Ambulatory Visit: Payer: Self-pay | Admitting: Oncology

## 2017-03-29 MED ORDER — SERTRALINE HCL 50 MG PO TABS: 50.0000 mg | ORAL_TABLET | Freq: Every day | ORAL | 3 refills | Status: DC

## 2017-03-31 ENCOUNTER — Other Ambulatory Visit: Payer: Self-pay | Admitting: *Deleted

## 2017-03-31 ENCOUNTER — Telehealth: Payer: Self-pay | Admitting: *Deleted

## 2017-03-31 DIAGNOSIS — T8130XS Disruption of wound, unspecified, sequela: Secondary | ICD-10-CM

## 2017-03-31 DIAGNOSIS — C4911 Malignant neoplasm of connective and soft tissue of right upper limb, including shoulder: Secondary | ICD-10-CM

## 2017-03-31 NOTE — Telephone Encounter (Signed)
"  Tuesday I am to receive chemotherapy.  I want to receive Perjetta and herceptin.  The sores on my back are opening up more with the current treatment.  I know the Perjetta has a lot of side effects but I received this before and it works.  I also want all my F/U appointments scheduled only with [name]."

## 2017-04-04 NOTE — Telephone Encounter (Signed)
LOS sent to reschedule pt.

## 2017-04-06 ENCOUNTER — Other Ambulatory Visit (HOSPITAL_BASED_OUTPATIENT_CLINIC_OR_DEPARTMENT_OTHER): Payer: Commercial Managed Care - PPO

## 2017-04-06 ENCOUNTER — Ambulatory Visit (HOSPITAL_BASED_OUTPATIENT_CLINIC_OR_DEPARTMENT_OTHER): Payer: Commercial Managed Care - PPO | Admitting: Oncology

## 2017-04-06 ENCOUNTER — Ambulatory Visit (HOSPITAL_BASED_OUTPATIENT_CLINIC_OR_DEPARTMENT_OTHER): Payer: Commercial Managed Care - PPO

## 2017-04-06 ENCOUNTER — Ambulatory Visit: Payer: Commercial Managed Care - PPO

## 2017-04-06 VITALS — BP 125/60 | HR 75 | Temp 98.5°F | Resp 18 | Ht 66.0 in | Wt 172.2 lb

## 2017-04-06 DIAGNOSIS — Z86711 Personal history of pulmonary embolism: Secondary | ICD-10-CM

## 2017-04-06 DIAGNOSIS — Z5112 Encounter for antineoplastic immunotherapy: Secondary | ICD-10-CM | POA: Diagnosis not present

## 2017-04-06 DIAGNOSIS — Z5111 Encounter for antineoplastic chemotherapy: Secondary | ICD-10-CM

## 2017-04-06 DIAGNOSIS — C50211 Malignant neoplasm of upper-inner quadrant of right female breast: Secondary | ICD-10-CM

## 2017-04-06 DIAGNOSIS — Z170 Estrogen receptor positive status [ER+]: Secondary | ICD-10-CM

## 2017-04-06 DIAGNOSIS — C4911 Malignant neoplasm of connective and soft tissue of right upper limb, including shoulder: Secondary | ICD-10-CM

## 2017-04-06 DIAGNOSIS — C778 Secondary and unspecified malignant neoplasm of lymph nodes of multiple regions: Secondary | ICD-10-CM

## 2017-04-06 DIAGNOSIS — G893 Neoplasm related pain (acute) (chronic): Secondary | ICD-10-CM

## 2017-04-06 DIAGNOSIS — I890 Lymphedema, not elsewhere classified: Secondary | ICD-10-CM | POA: Diagnosis not present

## 2017-04-06 LAB — CBC WITH DIFFERENTIAL/PLATELET
BASO%: 0.4 % (ref 0.0–2.0)
Basophils Absolute: 0 10*3/uL (ref 0.0–0.1)
EOS%: 1.9 % (ref 0.0–7.0)
Eosinophils Absolute: 0.2 10*3/uL (ref 0.0–0.5)
HCT: 37.4 % (ref 34.8–46.6)
HGB: 11.8 g/dL (ref 11.6–15.9)
LYMPH%: 20.5 % (ref 14.0–49.7)
MCH: 28.2 pg (ref 25.1–34.0)
MCHC: 31.6 g/dL (ref 31.5–36.0)
MCV: 89.3 fL (ref 79.5–101.0)
MONO#: 0.9 10*3/uL (ref 0.1–0.9)
MONO%: 10.4 % (ref 0.0–14.0)
NEUT#: 5.5 10*3/uL (ref 1.5–6.5)
NEUT%: 66.8 % (ref 38.4–76.8)
Platelets: 260 10*3/uL (ref 145–400)
RBC: 4.19 10*6/uL (ref 3.70–5.45)
RDW: 15.8 % — ABNORMAL HIGH (ref 11.2–14.5)
WBC: 8.3 10*3/uL (ref 3.9–10.3)
lymph#: 1.7 10*3/uL (ref 0.9–3.3)
nRBC: 0 % (ref 0–0)

## 2017-04-06 LAB — COMPREHENSIVE METABOLIC PANEL
ALT: 12 U/L (ref 0–55)
AST: 24 U/L (ref 5–34)
Albumin: 3.7 g/dL (ref 3.5–5.0)
Alkaline Phosphatase: 114 U/L (ref 40–150)
Anion Gap: 11 mEq/L (ref 3–11)
BUN: 13.5 mg/dL (ref 7.0–26.0)
CO2: 31 mEq/L — ABNORMAL HIGH (ref 22–29)
Calcium: 9.6 mg/dL (ref 8.4–10.4)
Chloride: 99 mEq/L (ref 98–109)
Creatinine: 0.9 mg/dL (ref 0.6–1.1)
EGFR: 81 mL/min/{1.73_m2} — ABNORMAL LOW (ref >90)
Glucose: 124 mg/dl (ref 70–140)
Potassium: 4 mEq/L (ref 3.5–5.1)
Sodium: 142 mEq/L (ref 136–145)
Total Bilirubin: 0.24 mg/dL (ref 0.20–1.20)
Total Protein: 7.8 g/dL (ref 6.4–8.3)

## 2017-04-06 MED ORDER — PROCHLORPERAZINE MALEATE 10 MG PO TABS
10.0000 mg | ORAL_TABLET | Freq: Once | ORAL | Status: AC
  Administered 2017-04-06: 10 mg via ORAL

## 2017-04-06 MED ORDER — DIPHENHYDRAMINE HCL 25 MG PO CAPS
25.0000 mg | ORAL_CAPSULE | Freq: Once | ORAL | Status: AC
Start: 2017-04-06 — End: 2017-04-06
  Administered 2017-04-06: 25 mg via ORAL

## 2017-04-06 MED ORDER — HEPARIN SOD (PORK) LOCK FLUSH 100 UNIT/ML IV SOLN
500.0000 [IU] | Freq: Once | INTRAVENOUS | Status: AC | PRN
  Administered 2017-04-06: 500 [IU]
  Filled 2017-04-06: qty 5

## 2017-04-06 MED ORDER — TRASTUZUMAB CHEMO 150 MG IV SOLR
450.0000 mg | Freq: Once | INTRAVENOUS | Status: AC
  Administered 2017-04-06: 450 mg via INTRAVENOUS
  Filled 2017-04-06: qty 21.43

## 2017-04-06 MED ORDER — ACETAMINOPHEN 325 MG PO TABS
650.0000 mg | ORAL_TABLET | Freq: Once | ORAL | Status: AC
  Administered 2017-04-06: 650 mg via ORAL

## 2017-04-06 MED ORDER — ACETAMINOPHEN 325 MG PO TABS
ORAL_TABLET | ORAL | Status: AC
  Filled 2017-04-06: qty 2

## 2017-04-06 MED ORDER — PROCHLORPERAZINE MALEATE 10 MG PO TABS
ORAL_TABLET | ORAL | Status: AC
  Filled 2017-04-06: qty 1

## 2017-04-06 MED ORDER — SODIUM CHLORIDE 0.9 % IV SOLN
Freq: Once | INTRAVENOUS | Status: AC
  Administered 2017-04-06: 12:00:00 via INTRAVENOUS

## 2017-04-06 MED ORDER — DIPHENHYDRAMINE HCL 25 MG PO CAPS
ORAL_CAPSULE | ORAL | Status: AC
  Filled 2017-04-06: qty 1

## 2017-04-06 MED ORDER — SODIUM CHLORIDE 0.9% FLUSH
10.0000 mL | INTRAVENOUS | Status: DC | PRN
  Administered 2017-04-06: 10 mL
  Filled 2017-04-06: qty 10

## 2017-04-06 MED ORDER — PACLITAXEL PROTEIN-BOUND CHEMO INJECTION 100 MG
125.0000 mg/m2 | Freq: Once | INTRAVENOUS | Status: AC
  Administered 2017-04-06: 250 mg via INTRAVENOUS
  Filled 2017-04-06: qty 50

## 2017-04-06 MED ORDER — SODIUM CHLORIDE 0.9 % IV SOLN
420.0000 mg | Freq: Once | INTRAVENOUS | Status: AC
  Administered 2017-04-06: 420 mg via INTRAVENOUS
  Filled 2017-04-06: qty 14

## 2017-04-06 NOTE — Patient Instructions (Signed)
[facility] Discharge Instructions for Patients Receiving Chemotherapy  Today you received the following chemotherapy agents :  Herceptin, Perjeta, and Abraxane.  To help prevent nausea and vomiting after your treatment, we encourage you to take your nausea medication as prescribed.  Take Zofran 8 mg by mouth every 12 hours as needed for nausea.  Can also take Compazine 10 mg by mouth every 6 hours as needed for nausea.  DO NOT Drive after taking Compazine as it can cause drowsiness.  DO NOT Eat  Greasy  Nor  Spicy  Foods.  DO Drink  Lots  Of  Fluids  As  Tolerated.   If you develop nausea and vomiting that is not controlled by your nausea medication, call the clinic.   BELOW ARE SYMPTOMS THAT SHOULD BE REPORTED IMMEDIATELY:  *FEVER GREATER THAN 100.5 F  *CHILLS WITH OR WITHOUT FEVER  NAUSEA AND VOMITING THAT IS NOT CONTROLLED WITH YOUR NAUSEA MEDICATION  *UNUSUAL SHORTNESS OF BREATH  *UNUSUAL BRUISING OR BLEEDING  TENDERNESS IN MOUTH AND THROAT WITH OR WITHOUT PRESENCE OF ULCERS  *URINARY PROBLEMS  *BOWEL PROBLEMS  UNUSUAL RASH Items with * indicate a potential emergency and should be followed up as soon as possible.  Feel free to call the clinic you have any questions or concerns. The clinic phone number is [phone].  Please show the [place] at check-in to the Emergency Department and triage nurse.

## 2017-04-06 NOTE — Patient Instructions (Signed)

## 2017-04-06 NOTE — Progress Notes (Signed)
[name]  Telephone:([phone]) [phone] Fax:(336) [phone]  OFFICE PROGRESS NOTE   ID: [name]   DOB: 1956/03/31  MR#: [id number]  JKK#:[id number]   PCP: [name], MD [name], [name], [name] RAD ZJI:[id number] [name], [name] OTHER:  [name], MD, [name]  CHIEF COMPLAINT:  Stage IV Breast Cancer, angiosarcoma  CURRENT TREATMENT: Abraxane, trastuzumab, pertuzumab  BREAST CANCER HISTORY: From the original intake note:  The patient originally presented in May 2003 when she noticed a lump in the upper inner quadrant of her right breast in September 2003.  She sought attention and had a mammogram which showed an obvious carcinoma in the upper inner quadrant of the right breast, approximately 2 cm.  There were some enlarged lymph nodes in the axilla and an FNA done showed those consistent with malignant cells, most likely an invasive ductal carcinoma.  At that point she was unsure of what to do and was referred by Dr. [name] for a discussion of her treatment options.  By biopsy, it was ER/PR negative and HER-2 was 3+.  DNA index was 1.42.  We reiterated that it was most important to have her disease surgically addressed at that point and had recommended lumpectomy and axillary nodes to be addressed with which she followed through and on 04-03-02, Dr. [name] performed a right partial mastectomy and a right axillary lymph node dissection.  Final pathology revealed a 2.4 cm. high grade, Grade III invasive ductal carcinoma with an adjacent .8 cm. also high grade invasive ductal carcinoma which was felt to represent an intramammary metastasis rather than a second primary.  A smaller mass was just medial to the larger mass.  The smaller mass was associated with high grade DCIS component.  There was no definite lymphovascular invasion identified.  However, one of fourteen axillary lymph nodes did contain a 1.5 cm. metastatic  deposit.  Postoperatively she did very well.  We reiterated the fact that she did need adjuvant chemotherapy, however, she refused and decided that she would pursue radiation.  She received radiation and completed that on 08-14-02. Her subsequent history is as detailed below.   INTERVAL HISTORY: [name] returns today for follow-up of her anesthetic breast cancer and angiosarcoma. She continues on Abraxane every 21 days given together with trastuzumab. She has been tolerating the treatments with no side effects that she is aware of and in particular no problems with nausea or vomiting and no peripheral neuropathy.  For pain control we prescribed methadone which she receives at 5 mg every 8 hours. She says this particularly helps her at bedtime. She also takes Neurontin at bedtime. She receives narcotics and Xanax through Dr. Remus Blake McEntee's office. The [place] controlled substances database was consulted today and she is using only the same to pharmacies that she always uses and she has no other physicians prescribing pain medication for her  REVIEW OF SYSTEMS: [name] just came back from her granddaughter's graduation and is very "up". She is doing "pretty good" overall. Despite the narcotics and methadone she is not constipated. The pain is well-controlled on her current medications. She is a little frustrated because she wishes things were getting better quicker. She had some problems with per [place] before but she would like that drug resumed. A detailed review of systems today was otherwise stable   PAST MEDICAL HISTORY: Past Medical History:  Diagnosis Date  . Breast cancer ([place])   . DVT (deep venous thrombosis) ([place]) 10/07/2011  .  Hypertension   . Radiation 11/29/2005-12/29/2005   right supraclavicular area 4147 cGy  . Radiation 06/26/02-08/14/02   right breast 5040 cGy, tumor bed boosted to 1260 cGy  . Rheumatic fever     PAST SURGICAL HISTORY: Past Surgical History:    Procedure Laterality Date  . BREAST SURGERY    . MASTECTOMY     bilateral  . PORT A CATH REVISION      FAMILY HISTORY Family History  Problem Relation Age of Onset  . Pancreatic cancer Mother   . Kidney cancer Sister 39  . Breast cancer Sister 21  . Breast cancer Other 49  The patient's father died in his 97C  from complications of alcohol abuse. The patient's mother died in her 47s from pancreatic cancer. The patient has 6 brothers, 2 sisters. Both her sisters have had breast cancer, both diagnosed after the age of 47. The patient has not had genetic testing so far.   GYNECOLOGIC HISTORY: Menarche age 31; first live birth age 27. She is GXP4. She is not sure when she stopped having periods. She never used hormone replacement.  SOCIAL HISTORY: The patient has worked in the past as a Psychologist, counselling. At home in addition to the patient are her husband [name], originally from [place], who works as a Administrator.  Also living in the home is her daughter [name] (she is studying to be a Marine scientist).  The other 2 children are [name], who has a college degree in psychology and works as a Probation officer; and [name], who lives in [place] and works as a Higher education careers adviser.[name] also sometimes stays in the home. In addition the patient has an aid who helps her almost daily.   ADVANCED DIRECTIVES: Not in place  HEALTH MAINTENANCE: Social History  Substance Use Topics  . Smoking status: Light Tobacco Smoker  . Smokeless tobacco: Never Used  . Alcohol use Yes     Comment: Rarely     Colonoscopy:  Not on file  PAP:  Not on file  Bone density:  Not on file  Lipid panel:  Not on file    Allergies  Allergen Reactions  . Pork-Derived Products Other (See Comments)    Pt says she just doesn't eat pork; has no reaction to pork products  . Tramadol Nausea Only  . Hydrocodone-Acetaminophen Itching and Rash    Current Outpatient Prescriptions  Medication Sig Dispense Refill  . alprazolam (XANAX)  2 MG tablet Take 2 mg by mouth daily.     . Ascorbic Acid (VITAMIN C) 100 MG tablet Take 100 mg by mouth daily.      . calcium-vitamin D (OSCAL WITH D) 500-200 MG-UNIT per tablet Take 1 tablet by mouth daily.      Marland Kitchen gabapentin (NEURONTIN) 300 MG capsule Take 1 capsule by mouth daily.    . methadone (DOLOPHINE) 5 MG tablet Take 1 tablet (5 mg total) by mouth every 8 (eight) hours. 90 tablet 0  . Multiple Vitamin (MULTIVITAMIN) capsule Take 1 capsule by mouth daily.      Marland Kitchen oxyCODONE-acetaminophen (PERCOCET) 10-325 MG tablet Take 2 tablets by mouth every 4 (four) hours as needed.  0  . oxymorphone (OPANA) 10 MG tablet Take 1 tablet by mouth 2 (two) times daily.  0  . prochlorperazine (COMPAZINE) 10 MG tablet Take 1 tablet by mouth every 6 (six) hours as needed.    . sertraline (ZOLOFT) 50 MG tablet Take 1 tablet (50 mg total) by mouth daily. 30 tablet 3  .  triamterene-hydrochlorothiazide (DYAZIDE) 37.5-25 MG capsule Take 1 each (1 capsule total) by mouth every morning. 30 capsule 3   No current facility-administered medications for this visit.    Facility-Administered Medications Ordered in Other Visits  Medication Dose Route Frequency Provider Last Rate Last Dose  . sodium chloride 0.9 % injection 10 mL  10 mL Intravenous PRN Selena Swaminathan, [name], MD   10 mL at 03/04/14 1421    OBJECTIVE: Middle-aged African-American woman appears Comfortable  Vitals:   04/06/17 1115  BP: 125/60  Pulse: 75  Resp: 18  Temp: 98.5 F (36.9 C)     Body mass index is 27.79 kg/m.    ECOG FS: 1 Filed Weights   04/06/17 1115  Weight: 172 lb 3.2 oz (78.1 kg)    Sclerae unicteric, pupils round and equal Oropharynx clear and moist No cervical or supraclavicular adenopathy Lungs no rales or rhonchi Heart regular rate and rhythm Abd soft, nontender, positive bowel sounds MSK no focal spinal tenderness, no upper extremity lymphedema Neuro: nonfocal, well oriented, appropriate affect Breasts: Status post  bilateral mastectomies Skin: The patient's back is imaged below.  Photo 04/06/2017    Photo right back 01/31/2017   Photo 03/07/2018      LAB RESULTS:.  Lab Results  Component Value Date   WBC 8.3 04/06/2017   NEUTROABS 5.5 04/06/2017   HGB 11.8 04/06/2017   HCT 37.4 04/06/2017   MCV 89.3 04/06/2017   PLT 260 04/06/2017      Chemistry      Component Value Date/Time   NA 142 03/16/2017 1341   K 3.4 (L) 03/16/2017 1341   CL 105 01/24/2017 0657   CL 101 03/07/2013 1411   CO2 28 03/16/2017 1341   BUN 9.1 03/16/2017 1341   CREATININE 0.9 03/16/2017 1341      Component Value Date/Time   CALCIUM 10.0 03/16/2017 1341   ALKPHOS 99 03/16/2017 1341   AST 23 03/16/2017 1341   ALT 13 03/16/2017 1341   BILITOT 0.40 03/16/2017 1341       STUDIES: No results found.   ASSESSMENT:  61 y.o. [name] woman with stage IV breast cancer manifested chiefly by loco-regional nodal disease (neck, chest), without lung, liver, bone, or brain involvement.  (1) Status post right upper inner quadrant lumpectomy and sentinel lymph node sampling 03/04/2002 for 2 separate foci of invasive ductal carcinoma, mpT2 pN1 or stage IIB, both foci grade 3, both estrogen and progesterone receptor positive, both HercepTest 3+, Mib-1 56%  (2) Reexcision for margins 05/27/2002 showed no residual cancer in the breast.  (3) The patient refused adjuvant systemic therapy.  (4) Adjuvant radiation treatment completed 08/14/2002.  (5) recurrence in the right breast in 02/2004 showing a morphologically different tumor, again grade 3, again estrogen and progesterone receptor negative, with an MIB-1 of 14% and Herceptest 3+.  (5) Between 03/2004 and 07/2004 she received dose dense Doxorubicin/Cyclophosphamide x 4 given with trastuzumab, followed by weekly Docetaxel x 8, again given with trastuzumab.  (6) Right mastectomy 07/13/2004 showed scattered microscopic foci of residual disease over an area   greater than 5 cm. Margins were negative.  (7) Postoperative Docetaxel continued until 09/2004.  (8) Trastuzumab (Herceptin) given 08/2004 through 01/2012 with some brief interruptions.  (9) Isolated right cervical nodal recurrence 10/2005, treated with radiation to the right supraclavicular area (total 41.5 gray) completed 12/29/2005.  (10) Navelbine given together with Herceptin  11/2005 through 03/2006.  (9) Left mastectomy 02/13/2006 for ductal carcinoma in situ, grade 2, estrogen  and progesterone receptor negative, with negative margins; 0 of 3 lymph nodes involved  (10) treated with Lapatinib and Capecitabine before 10/2009, for an unclear duration and with unclear results (cannot locate data on chart review).  (11) Status post right supraclavicular lymph node biopsy 09/2010 again positive for an invasive ductal carcinoma, estrogen and progesterone receptor negative, HER-2 positive by C[facility] with a ratio 4.25.  (12) Navelbine given together with Herceptin between 05/2011 and 11/2011.  (13) Carboplatin/ Gemcitabine/ Herceptin given for 2 cycles, in 12/2011 and 01/2012.  (14) TDM-1 ([place]) started 02/2012. Last dose 10/02/2013 after which the patient discontinued treatment at her own discretion. Echo on December 2014 showed a well preserved ejection fraction.  (15) Deep vein thrombosis of the right upper extremity documented 04/20/2011.  She completed anticoagulant therapy with Coumadin on 03/25/2013.  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 Left chest Port-A-Cath placed on 04/08/2013; being flushed every 6 weeks  RIGHT UPPER EXTREMITY ANGIOSARCOMA VS BREAST CANCER: (18) treated at [facility] [place] August 2016 with paclitaxel,  trastuzumab and pertuzumab 1 cycle, afterwards referred to hospice  (19) under the care of hospice of [facility] August 2016 to 05/08/2016, when the patient opted for a second try at chemotherapy  (20) started low-dose Abraxane, trastuzumab and pertuzumab 06/07/2016, to be repeated every 3 weeks.   (a) pretreatment echocardiogram 06/06/2016 showed a 60-65% ejection fraction  (b) significant compliance problems compromise response to treatment  (c) last echocardiogram 02/20/17 LVEF 55-60%  (21) Chronic pain secondary to known metastatic disease  (a) patient signed pain contract 10/19/2016  (b) as of 11/16/2016 receives her pain medications from Dr [name]   PLAN: [place] is overall remarkably stable to slightly improved on her current treatment. I think adding per Ashok Norris back assuming she can tolerated would be a good idea for her. I am doing it at a lower dose than before hopefully avoiding symptoms.  Her pain is well-controlled on her current regimen and I am not making any change in her methadone dosing, which is 5 mg 3 times a day. The rest of her pain medication is managed through Dr. Noland Fordyce office.  She is going back to the wound clinic which I think is a good move and should be able to help her with particularly the back wounds, some of which are better but some of which are still open.  Otherwise she will return here in 3 weeks for her next treatment. She will see me again in 6 weeks. She knows to call for any problems that may develop before that visit.    Chauncey Cruel, MD 04/06/2017 11:19 AM

## 2017-04-11 ENCOUNTER — Encounter: Payer: Commercial Managed Care - PPO | Attending: Internal Medicine | Admitting: Internal Medicine

## 2017-04-11 DIAGNOSIS — F17200 Nicotine dependence, unspecified, uncomplicated: Secondary | ICD-10-CM | POA: Diagnosis not present

## 2017-04-11 DIAGNOSIS — Z923 Personal history of irradiation: Secondary | ICD-10-CM | POA: Diagnosis not present

## 2017-04-11 DIAGNOSIS — Z9221 Personal history of antineoplastic chemotherapy: Secondary | ICD-10-CM | POA: Diagnosis not present

## 2017-04-11 DIAGNOSIS — I890 Lymphedema, not elsewhere classified: Secondary | ICD-10-CM | POA: Diagnosis not present

## 2017-04-11 DIAGNOSIS — F419 Anxiety disorder, unspecified: Secondary | ICD-10-CM | POA: Diagnosis not present

## 2017-04-11 DIAGNOSIS — Z9013 Acquired absence of bilateral breasts and nipples: Secondary | ICD-10-CM | POA: Diagnosis not present

## 2017-04-11 DIAGNOSIS — I10 Essential (primary) hypertension: Secondary | ICD-10-CM | POA: Diagnosis not present

## 2017-04-11 DIAGNOSIS — C4911 Malignant neoplasm of connective and soft tissue of right upper limb, including shoulder: Secondary | ICD-10-CM | POA: Diagnosis not present

## 2017-04-11 DIAGNOSIS — Z853 Personal history of malignant neoplasm of breast: Secondary | ICD-10-CM | POA: Diagnosis not present

## 2017-04-11 DIAGNOSIS — Z86718 Personal history of other venous thrombosis and embolism: Secondary | ICD-10-CM | POA: Diagnosis not present

## 2017-04-11 DIAGNOSIS — L98422 Non-pressure chronic ulcer of back with fat layer exposed: Secondary | ICD-10-CM | POA: Diagnosis not present

## 2017-04-12 ENCOUNTER — Other Ambulatory Visit: Payer: Self-pay

## 2017-04-12 MED ORDER — METHADONE HCL 5 MG PO TABS: 5.0000 mg | ORAL_TABLET | Freq: Three times a day (TID) | ORAL | 0 refills | Status: DC

## 2017-04-12 NOTE — Progress Notes (Addendum)
OMELIA, MARQUART ([id number]) Visit Report for 04/11/2017 Allergy List Details Patient Name: [name], [name] Date of Service: 04/11/2017 12:45 PM Medical Record Patient Account Number: [id number] Number: Treating RN: Baruch Gouty, RN, BSN, Rita September 02, 1956 [phone] y.o. Other Clinician: Date of Birth/Sex: Female) Treating ROBSON, MICHAEL Primary Care Ambermarie Honeyman: [name] Camiah Humm/Extender: G Referring Lee-Ann Gal: [name] Weeks in Treatment: 0 Allergies Active Allergies No Known Allergies Allergy Notes Electronic Signature(s) Signed: 04/11/2017 12:58:07 PM By: Regan Lemming BSN, RN Entered By: Regan Lemming on 04/11/2017 12:58:07 [name] ([id number]) -------------------------------------------------------------------------------- Arrival Information Details Patient Name: [name] Date of Service: 04/11/2017 12:45 PM Medical Record Patient Account Number: [id number] Number: Treating RN: Baruch Gouty, RN, BSN, Rita Sep 17, 1956 [phone] y.o. Other Clinician: Date of Birth/Sex: Female) Treating ROBSON, MICHAEL Primary Care Ranisha Allaire: [name] Jakub Debold/Extender: G Referring Anthoney Sheppard: Rodney Langton in Treatment: 0 Visit Information Patient Arrived: Ambulatory Arrival Time: 13:03 Accompanied By: dtr Transfer Assistance: Manual Patient Identification Verified: Yes Secondary Verification Process Yes Completed: Patient Requires Transmission-Based No Precautions: Patient Has Alerts: No History Since Last Visit All ordered tests and consults were completed: No Added or deleted any medications: No Any new allergies or adverse reactions: No Had a fall or experienced change in activities of daily living that may affect risk of falls: No Signs or symptoms of abuse/neglect since last visito No Hospitalized since last visit: No Electronic Signature(s) Signed: 04/11/2017 4:40:28 PM By: Regan Lemming BSN, RN Entered By: Regan Lemming on 04/11/2017 13:03:44 [name] ([id number]) -------------------------------------------------------------------------------- Clinic Level of Care Assessment Details Patient Name: [name] Date of Service: 04/11/2017 12:45 PM Medical Record Patient Account Number: [id number] Number: Treating RN: Baruch Gouty, RN, BSN, Rita 1956/07/11 [phone] y.o. Other Clinician: Date of Birth/Sex: Female) Treating ROBSON, MICHAEL Primary Care Jimmie Dattilio: [name] Lamarius Dirr/Extender: G Referring Alayne Estrella: Rodney Langton in Treatment: 0 Clinic Level of Care Assessment Items TOOL 2 Quantity Score []  - Use when only an EandM is performed on the INITIAL visit 0 ASSESSMENTS - Nursing Assessment / Reassessment X - General Physical Exam (combine w/ comprehensive assessment (listed just 1 20 below) when performed on new pt. evals) X - Comprehensive Assessment (HX, ROS, Risk Assessments, Wounds Hx, etc.) 1 25 ASSESSMENTS - Wound and Skin Assessment / Reassessment X - Simple Wound Assessment / Reassessment - one wound 1 5 []  - Complex Wound Assessment / Reassessment - multiple wounds 0 []  - Dermatologic / Skin Assessment (not related to wound area) 0 ASSESSMENTS - Ostomy and/or Continence Assessment and Care []  - Incontinence Assessment and Management 0 []  - Ostomy Care Assessment and Management (repouching, etc.) 0 PROCESS - Coordination of Care []  - Simple Patient / Family Education for ongoing care 0 []  - Complex (extensive) Patient / Family Education for ongoing care 0 X - Staff obtains Programmer, systems, Records, Test Results / Process Orders 1 10 []  - Staff telephones HHA, Nursing Homes / Clarify orders / etc 0 []  - Routine Transfer to another Facility (non-emergent condition) 0 []  - Routine Hospital Admission (non-emergent condition) 0 []  - New Admissions / Biomedical engineer / Ordering NPWT, Apligraf, etc. 0 []  - Emergency Hospital Admission (emergent condition) 0 Finazzo, Malynda ([id number]) []  - Simple  Discharge Coordination 0 []  - Complex (extensive) Discharge Coordination 0 PROCESS - Special Needs []  - Pediatric / Minor Patient Management 0 []  - Isolation Patient Management 0 []  - Hearing / Language / Visual special needs 0 []  - Assessment of Community assistance (transportation, D/C planning, etc.) 0 []  - Additional assistance / Altered  mentation 0 []  - Support Surface(s) Assessment (bed, cushion, seat, etc.) 0 INTERVENTIONS - Wound Cleansing / Measurement X - Wound Imaging (photographs - any number of wounds) 1 5 []  - Wound Tracing (instead of photographs) 0 X - Simple Wound Measurement - one wound 1 5 []  - Complex Wound Measurement - multiple wounds 0 X - Simple Wound Cleansing - one wound 1 5 []  - Complex Wound Cleansing - multiple wounds 0 INTERVENTIONS - Wound Dressings X - Small Wound Dressing one or multiple wounds 1 10 []  - Medium Wound Dressing one or multiple wounds 0 []  - Large Wound Dressing one or multiple wounds 0 []  - Application of Medications - injection 0 INTERVENTIONS - Miscellaneous []  - External ear exam 0 []  - Specimen Collection (cultures, biopsies, blood, body fluids, etc.) 0 []  - Specimen(s) / Culture(s) sent or taken to Lab for analysis 0 []  - Patient Transfer (multiple staff / Harrel Lemon Lift / Similar devices) 0 []  - Simple Staple / Suture removal (25 or less) 0 Nicolaisen, Janella ([id number]) []  - Complex Staple / Suture removal (26 or more) 0 []  - Hypo / Hyperglycemic Management (close monitor of Blood Glucose) 0 []  - Ankle / Brachial Index (ABI) - do not check if billed separately 0 Has the patient been seen at the hospital within the last three years: Yes Total Score: 85 Level Of Care: New/Established - Level 3 Electronic Signature(s) Signed: 04/11/2017 4:40:28 PM By: Regan Lemming BSN, RN Entered By: Regan Lemming on 04/11/2017 13:48:42 [name] ([id number]) -------------------------------------------------------------------------------- Encounter  Discharge Information Details Patient Name: [name] Date of Service: 04/11/2017 12:45 PM Medical Record Patient Account Number: [id number] Number: Treating RN: Baruch Gouty, RN, BSN, Rita May 30, 1956 [phone] y.o. Other Clinician: Date of Birth/Sex: Female) Treating ROBSON, MICHAEL Primary Care Rikki Smestad: [name] Burak Zerbe/Extender: G Referring Bryler Dibble: Rodney Langton in Treatment: 0 Encounter Discharge Information Items Discharge Pain Level: 0 Discharge Condition: Stable Ambulatory Status: Ambulatory Discharge Destination: Home Private Transportation: Auto Accompanied By: dtr Schedule Follow-up Appointment: No Medication Reconciliation completed and No provided to Patient/Care Shreyansh Tiffany: Clinical Summary of Care: Electronic Signature(s) Signed: 04/11/2017 4:40:28 PM By: Regan Lemming BSN, RN Entered By: Regan Lemming on 04/11/2017 14:00:14 [name] ([id number]) -------------------------------------------------------------------------------- Lower Extremity Assessment Details Patient Name: [name] Date of Service: 04/11/2017 12:45 PM Medical Record Patient Account Number: [id number] Number: Treating RN: Baruch Gouty, RN, BSN, Rita 26-Feb-1956 [phone] y.o. Other Clinician: Date of Birth/Sex: Female) Treating ROBSON, MICHAEL Primary Care Donavan Kerlin: [name] Naftuli Dalsanto/Extender: G Referring Demitrios Molyneux: [name] Weeks in Treatment: 0 Electronic Signature(s) Signed: 04/11/2017 4:40:28 PM By: Regan Lemming BSN, RN Entered By: Regan Lemming on 04/11/2017 13:04:24 [name] ([id number]) -------------------------------------------------------------------------------- Multi Wound Chart Details Patient Name: [name] Date of Service: 04/11/2017 12:45 PM Medical Record Patient Account Number: [id number] Number: Treating RN: Baruch Gouty, RN, BSN, Rita [birth year] [phone] y.o. Other Clinician: Date of Birth/Sex: Female) Treating ROBSON,  MICHAEL Primary Care Jerremy Maione: [name] Meir Elwood/Extender: G Referring Aspin Palomarez: [name] Weeks in Treatment: 0 Vital Signs Height(in): 66 Pulse(bpm): 70 Weight(lbs): Blood Pressure 125/68 (mmHg): Body Mass Index(BMI): Temperature(F): 98.3 Respiratory Rate 18 (breaths/min): Photos: [N/A:N/A] Wound Location: Back N/A N/A Wounding Event: Gradually Appeared N/A N/A Primary Etiology: Malignant Wound N/A N/A Comorbid History: Lymphedema, Deep Vein N/A N/A Thrombosis, Hypertension, Received Chemotherapy, Received Radiation, Confinement Anxiety Date Acquired: 08/17/2016 N/A N/A Weeks of Treatment: 0 N/A N/A Wound Status: Open N/A N/A Measurements L x W x D 8.2x2.5x0.3 N/A N/A (cm) Area (cm) : 16.101 N/A N/A  Volume (cm) : 4.83 N/A N/A % Reduction in Area: 0.00% N/A N/A % Reduction in Volume: 0.00% N/A N/A Classification: Full Thickness Without N/A N/A Exposed Support Structures Poe, Rebbie ([id number]) Exudate Amount: Medium N/A N/A Exudate Type: Serosanguineous N/A N/A Exudate Color: red, brown N/A N/A Wound Margin: Indistinct, nonvisible N/A N/A Granulation Amount: Small (1-33%) N/A N/A Granulation Quality: Pale N/A N/A Necrotic Amount: Large (67-100%) N/A N/A Exposed Structures: Fat Layer (Subcutaneous N/A N/A Tissue) Exposed: Yes Fascia: No Tendon: No Muscle: No Joint: No Bone: No Epithelialization: None N/A N/A Periwound Skin Texture: Excoriation: No N/A N/A Induration: No Callus: No Crepitus: No Rash: No Scarring: No Periwound Skin Maceration: No N/A N/A Moisture: Dry/Scaly: No Periwound Skin Color: Atrophie Blanche: No N/A N/A Cyanosis: No Ecchymosis: No Erythema: No Hemosiderin Staining: No Mottled: No Pallor: No Rubor: No Temperature: No Abnormality N/A N/A Tenderness on Yes N/A N/A Palpation: Wound Preparation: Ulcer Cleansing: N/A N/A Rinsed/Irrigated with Saline Topical Anesthetic Applied: Other:  Lidocaine 4% Treatment Notes Wound #4 (Back) 1. Cleansed with: Clean wound with Normal Saline 4. Dressing Applied: Aquacel Ag 5. Secondary Dressing Applied Capano, Natesha ([id number]) Bordered Foam Dressing Dry Gauze 7. Secured with Recruitment consultant) Signed: 04/11/2017 5:27:39 PM By: Linton Ham MD Previous Signature: 04/11/2017 4:40:28 PM Version By: Regan Lemming BSN, RN Entered By: Linton Ham on 04/11/2017 17:16:19 [name] ([id number]) -------------------------------------------------------------------------------- Multi-Disciplinary Care Plan Details Patient Name: [name] Date of Service: 04/11/2017 12:45 PM Medical Record Patient Account Number: [id number] Number: Treating RN: Baruch Gouty, RN, BSN, Rita 02/22/1956 [phone] y.o. Other Clinician: Date of Birth/Sex: Female) Treating ROBSON, MICHAEL Primary Care Wade Sigala: [name] Rosbel Buckner/Extender: G Referring Justo Hengel: Rodney Langton in Treatment: 0 Active Inactive ` Malignancy/Atypical Etiology Nursing Diagnoses: Knowledge deficit related to disease process and management of atypical ulcer etiology Knowledge deficit related to disease process and management of malignancy Goals: Patient/caregiver will verbalize understanding of disease process and disease management of atypical ulcer etiology Date Initiated: 04/11/2017 Target Resolution Date: 10/07/2017 Goal Status: Active Patient/caregiver will verbalize understanding of disease process and disease management of malignancy Date Initiated: 04/11/2017 Target Resolution Date: 10/07/2017 Goal Status: Active Interventions: Assess patient and family medical history for signs and symptoms of malignancy/atypical etiology upon admission Provide education on atypical ulcer etiologies Provide education on malignant ulcerations Treatment Activities: Lab evaluations : 04/11/2017 Notes: ` Orientation to the Wound Care Program Nursing  Diagnoses: Knowledge deficit related to the wound healing center program Goals: Patient/caregiver will verbalize understanding of the [place] Program NAKYIA, DAU ([id number]) Date Initiated: 04/11/2017 Target Resolution Date: 10/07/2017 Goal Status: Active Interventions: Provide education on orientation to the wound center Notes: ` Wound/Skin Impairment Nursing Diagnoses: Impaired tissue integrity Knowledge deficit related to ulceration/compromised skin integrity Goals: Patient/caregiver will verbalize understanding of skin care regimen Date Initiated: 04/11/2017 Target Resolution Date: 10/07/2017 Goal Status: Active Ulcer/skin breakdown will have a volume reduction of 30% by week 4 Date Initiated: 04/11/2017 Target Resolution Date: 10/07/2017 Goal Status: Active Ulcer/skin breakdown will have a volume reduction of 50% by week 8 Date Initiated: 04/11/2017 Target Resolution Date: 10/07/2017 Goal Status: Active Ulcer/skin breakdown will have a volume reduction of 80% by week 12 Date Initiated: 04/11/2017 Target Resolution Date: 10/07/2017 Goal Status: Active Ulcer/skin breakdown will heal within 14 weeks Date Initiated: 04/11/2017 Target Resolution Date: 10/07/2017 Goal Status: Active Interventions: Assess patient/caregiver ability to obtain necessary supplies Assess patient/caregiver ability to perform ulcer/skin care regimen upon admission and as needed Assess ulceration(s) every visit Provide education on  ulcer and skin care Treatment Activities: Referred to DME Delories Mauri for dressing supplies : 04/11/2017 Skin care regimen initiated : 04/11/2017 Topical wound management initiated : 04/11/2017 Notes: OMA, MARZAN ([id number]) Electronic Signature(s) Signed: 04/11/2017 4:40:28 PM By: Regan Lemming BSN, RN Entered By: Regan Lemming on 04/11/2017 13:45:25 [name] ([id number]) -------------------------------------------------------------------------------- Pain Assessment  Details Patient Name: [name] Date of Service: 04/11/2017 12:45 PM Medical Record Patient Account Number: [id number] Number: Treating RN: Baruch Gouty, RN, BSN, Rita [birth year] [phone] y.o. Other Clinician: Date of Birth/Sex: Female) Treating ROBSON, MICHAEL Primary Care Ryin Ambrosius: [name] Jadae Steinke/Extender: G Referring Ahmarion Saraceno: [name] Weeks in Treatment: 0 Active Problems Location of Pain Severity and Description of Pain Patient Has Paino Yes Site Locations Pain Location: Generalized Pain, Pain in Ulcers With Dressing Change: Yes Pain Management and Medication Current Pain Management: Electronic Signature(s) Signed: 04/11/2017 4:40:28 PM By: Regan Lemming BSN, RN Entered By: Regan Lemming on 04/11/2017 13:04:04 [name] ([id number]) -------------------------------------------------------------------------------- Patient/Caregiver Education Details Patient Name: [name] Date of Service: 04/11/2017 12:45 PM Medical Record Patient Account Number: [id number] Number: Treating RN: Baruch Gouty, RN, BSN, Rita 1956/07/09 [phone] y.o. Other Clinician: Date of Birth/Gender: Female) Treating ROBSON, Ambrose Primary Care Physician: [name] Physician/Extender: G Referring Physician: Rodney Langton in Treatment: 0 Education Assessment Education Provided To: Patient and Caregiver dtr Education Topics Provided Malignant/Atypical Wounds: Methods: Explain/Verbal Responses: State content correctly Welcome To The [facility]: Methods: Explain/Verbal Responses: State content correctly Wound/Skin Impairment: Methods: Explain/Verbal Responses: State content correctly Electronic Signature(s) Signed: 04/11/2017 4:40:28 PM By: Regan Lemming BSN, RN Entered By: Regan Lemming on 04/11/2017 14:00:39 [name] ([id number]) -------------------------------------------------------------------------------- Wound Assessment Details Patient Name:  [name] Date of Service: 04/11/2017 12:45 PM Medical Record Patient Account Number: [id number] Number: Treating RN: Baruch Gouty, RN, BSN, Rita [birth year] [phone] y.o. Other Clinician: Date of Birth/Sex: Female) Treating ROBSON, MICHAEL Primary Care Shayanna Thatch: [name] Ada Holness/Extender: G Referring Zulma Court: [name] Weeks in Treatment: 0 Wound Status Wound Number: 4 Primary Malignant Wound Etiology: Wound Location: Back Wound Open Wounding Event: Gradually Appeared Status: Date Acquired: 08/17/2016 Comorbid Lymphedema, Deep Vein Thrombosis, Weeks Of Treatment: 0 History: Hypertension, Received Chemotherapy, Clustered Wound: No Received Radiation, Confinement Anxiety Photos Photo Uploaded By: Regan Lemming on 04/11/2017 16:39:33 Wound Measurements Length: (cm) 8.2 Width: (cm) 2.5 Depth: (cm) 0.3 Area: (cm) 16.101 Volume: (cm) 4.83 % Reduction in Area: 0% % Reduction in Volume: 0% Epithelialization: None Tunneling: No Undermining: No Wound Description Full Thickness Without Exposed Classification: Support Structures Wound Margin: Indistinct, nonvisible Exudate Medium Amount: Exudate Type: Serosanguineous Exudate Color: red, brown Stash, Myrl ([id number]) Foul Odor After Cleansing: No Slough/Fibrino Yes Wound Bed Granulation Amount: Small (1-33%) Exposed Structure Granulation Quality: Pale Fascia Exposed: No Necrotic Amount: Large (67-100%) Fat Layer (Subcutaneous Tissue) Exposed: Yes Necrotic Quality: Adherent Slough Tendon Exposed: No Muscle Exposed: No Joint Exposed: No Bone Exposed: No Periwound Skin Texture Texture Color No Abnormalities Noted: No No Abnormalities Noted: No Callus: No Atrophie Blanche: No Crepitus: No Cyanosis: No Excoriation: No Ecchymosis: No Induration: No Erythema: No Rash: No Hemosiderin Staining: No Scarring: No Mottled: No Pallor: No Moisture Rubor: No No Abnormalities Noted: No Dry /  Scaly: No Temperature / Pain Maceration: No Temperature: No Abnormality Tenderness on Palpation: Yes Wound Preparation Ulcer Cleansing: Rinsed/Irrigated with Saline Topical Anesthetic Applied: Other: Lidocaine 4%, Treatment Notes Wound #4 (Back) 1. Cleansed with: Clean wound with Normal Saline 4. Dressing Applied: Aquacel Ag 5. Secondary Dressing Applied Bordered Foam Dressing Dry Gauze 7. Secured with  Tape Electronic Signature(s) Signed: 04/11/2017 4:40:28 PM By: Regan Lemming BSN, RN Entered By: Regan Lemming on 04/11/2017 14:01:36 [name] ([id number]) -------------------------------------------------------------------------------- Vitals Details Patient Name: [name] Date of Service: 04/11/2017 12:45 PM Medical Record Patient Account Number: [id number] Number: Treating RN: Baruch Gouty, RN, BSN, Rita 04-15-1956 [phone] y.o. Other Clinician: Date of Birth/Sex: Female) Treating ROBSON, MICHAEL Primary Care Eligio Angert: [name] Lynx Goodrich/Extender: G Referring Jaemarie Hochberg: [name] Weeks in Treatment: 0 Vital Signs Time Taken: 13:08 Temperature (F): 98.3 Height (in): 66 Pulse (bpm): 70 Respiratory Rate (breaths/min): 18 Blood Pressure (mmHg): 125/68 Reference Range: 80 - 120 mg / dl Electronic Signature(s) Signed: 04/11/2017 4:40:28 PM By: Regan Lemming BSN, RN Entered By: Regan Lemming on 04/11/2017 13:10:37

## 2017-04-12 NOTE — Progress Notes (Addendum)
[name], [name] ([id number]) Visit Report for 04/11/2017 Abuse/Suicide Risk Screen Details Patient Name: [name], [name] Date of Service: 04/11/2017 12:45 PM Medical Record Patient Account Number: [id number] Number: Treating RN: Baruch Gouty, RN, BSN, Rita 09-04-1956 [phone] y.o. Other Clinician: Date of Birth/Sex: Female) Treating ROBSON, MICHAEL Primary Care Christinia Lambeth: [name] Jozee Hammer/Extender: G Referring Cyncere Sontag: [name] Weeks in Treatment: 0 Abuse/Suicide Risk Screen Items Answer ABUSE/SUICIDE RISK SCREEN: Has anyone close to you tried to hurt or harm you recentlyo No Do you feel uncomfortable with anyone in your familyo No Has anyone forced you do things that you didnot want to doo No Do you have any thoughts of harming yourselfo No Patient displays signs or symptoms of abuse and/or neglect. No Electronic Signature(s) Signed: 04/11/2017 1:00:43 PM By: Regan Lemming BSN, RN Entered By: Regan Lemming on 04/11/2017 13:00:42 [name] ([id number]) -------------------------------------------------------------------------------- Activities of Daily Living Details Patient Name: [name] Date of Service: 04/11/2017 12:45 PM Medical Record Patient Account Number: [id number] Number: Treating RN: Baruch Gouty, RN, BSN, Rita 11-23-1955 [phone] y.o. Other Clinician: Date of Birth/Sex: Female) Treating ROBSON, MICHAEL Primary Care Ericson Nafziger: [name] Trevelle Mcgurn/Extender: G Referring Okey Zelek: [name] Weeks in Treatment: 0 Activities of Daily Living Items Answer Activities of Daily Living (Please select one for each item) Drive Automobile Completely Able Take Medications Completely Able Use Telephone Completely Able Care for Appearance Completely Able Use Toilet Completely Able Bath / Shower Completely Able Dress Self Completely Able Feed Self Completely Able Walk Completely Able Get In / Out Bed Completely Able Housework Completely Able Prepare  Meals Completely Able Handle Money Completely Able Shop for Self Completely Able Electronic Signature(s) Signed: 04/11/2017 4:40:28 PM By: Regan Lemming BSN, RN Entered By: Regan Lemming on 04/11/2017 13:04:47 [name] ([id number]) -------------------------------------------------------------------------------- Education Assessment Details Patient Name: [name] Date of Service: 04/11/2017 12:45 PM Medical Record Patient Account Number: [id number] Number: Treating RN: Baruch Gouty, RN, BSN, Rita [birth year] [phone] y.o. Other Clinician: Date of Birth/Sex: Female) Treating ROBSON, MICHAEL Primary Care English [place]: [name] Irving Lubbers/Extender: G Referring Brenen Beigel: Rodney Langton in Treatment: 0 Primary Learner Assessed: Patient Learning Preferences/Education Level/Primary Language Learning Preference: Explanation Highest Education Level: High School Preferred Language: English Cognitive Barrier Assessment/Beliefs Language Barrier: No Physical Barrier Assessment Impaired Vision: Yes Glasses Impaired Hearing: No Decreased Hand dexterity: No Knowledge/Comprehension Assessment Knowledge Level: High Comprehension Level: High Ability to understand written High instructions: Ability to understand verbal High instructions: Motivation Assessment Anxiety Level: Calm Cooperation: Cooperative Education Importance: Acknowledges Need Interest in Health Problems: Asks Questions Perception: Coherent Willingness to Engage in Self- High Management Activities: Readiness to Engage in Self- High Management Activities: Electronic Signature(s) Signed: 04/11/2017 1:00:16 PM By: Regan Lemming BSN, RN Previous Signature: 04/11/2017 12:59:52 PM Version By: Regan Lemming BSN, RN Entered By: Regan Lemming on 04/11/2017 13:00:16 LYNNA, ZAMORANO ([id number]) TANEA, MOGA ([id number]) -------------------------------------------------------------------------------- Fall Risk Assessment  Details Patient Name: [name] Date of Service: 04/11/2017 12:45 PM Medical Record Patient Account Number: [id number] Number: Treating RN: Baruch Gouty, RN, BSN, Rita 1955-12-05 [phone] y.o. Other Clinician: Date of Birth/Sex: Female) Treating ROBSON, MICHAEL Primary Care Caelan Branden: [name] Jahvon Gosline/Extender: G Referring Devean Skoczylas: [name] Weeks in Treatment: 0 Fall Risk Assessment Items Have you had 2 or more falls in the last 12 monthso 0 No Have you had any fall that resulted in injury in the last 12 monthso 0 No FALL RISK ASSESSMENT: History of falling - immediate or within 3 months 0 No Secondary diagnosis 0 No Ambulatory aid None/bed rest/wheelchair/nurse 0 Yes  Crutches/cane/walker 0 No Furniture 0 No IV Access/Saline Lock 0 No Gait/Training Normal/bed rest/immobile 0 Yes Weak 0 No Impaired 0 No Mental Status Oriented to own ability 0 Yes Electronic Signature(s) Signed: 04/11/2017 4:40:28 PM By: Regan Lemming BSN, RN Entered By: Regan Lemming on 04/11/2017 13:05:08 [name] ([id number]) -------------------------------------------------------------------------------- Foot Assessment Details Patient Name: [name] Date of Service: 04/11/2017 12:45 PM Medical Record Patient Account Number: [id number] Number: Treating RN: Baruch Gouty, RN, BSN, Rita August 30, 1956 [phone] y.o. Other Clinician: Date of Birth/Sex: Female) Treating ROBSON, MICHAEL Primary Care Dayla Gasca: [name] Lukus Binion/Extender: G Referring Keston Seever: [name] Weeks in Treatment: 0 Foot Assessment Items Site Locations + = Sensation present, - = Sensation absent, C = Callus, U = Ulcer R = Redness, W = Warmth, M = Maceration, PU = Pre-ulcerative lesion F = Fissure, S = Swelling, D = Dryness Assessment Right: Left: Other Deformity: No No Prior Foot Ulcer: No No Prior Amputation: No No Charcot Joint: No No Ambulatory Status: Ambulatory Without Help Gait:  Steady Electronic Signature(s) Signed: 04/11/2017 4:40:28 PM By: Regan Lemming BSN, RN Entered By: Regan Lemming on 04/11/2017 13:05:23 [name] ([id number]) -------------------------------------------------------------------------------- Nutrition Risk Assessment Details Patient Name: [name] Date of Service: 04/11/2017 12:45 PM Medical Record Patient Account Number: [id number] Number: Treating RN: Baruch Gouty, RN, BSN, Rita 1956-01-17 [phone] y.o. Other Clinician: Date of Birth/Sex: Female) Treating ROBSON, MICHAEL Primary Care Alica Shellhammer: [name] Kia Varnadore/Extender: G Referring Rykker Coviello: [name] Weeks in Treatment: 0 Height (in): 65 Weight (lbs): 159.2 Body Mass Index (BMI): 26.5 Nutrition Risk Assessment Items NUTRITION RISK SCREEN: I have an illness or condition that made me change the kind and/or 0 No amount of food I eat I eat fewer than two meals per day 0 No I eat few fruits and vegetables, or milk products 0 No I have three or more drinks of beer, liquor or wine almost every day 0 No I have tooth or mouth problems that make it hard for me to eat 0 No I don't always have enough money to buy the food I need 0 No I eat alone most of the time 0 No I take three or more different prescribed or over-the-counter drugs a 0 No day Without wanting to, I have lost or gained 10 pounds in the last six 0 No months I am not always physically able to shop, cook and/or feed myself 0 No Nutrition Protocols Good Risk Protocol 0 No interventions needed Moderate Risk Protocol Electronic Signature(s) Signed: 04/11/2017 4:40:28 PM By: Regan Lemming BSN, RN Entered By: Regan Lemming on 04/11/2017 13:05:14

## 2017-04-12 NOTE — Progress Notes (Signed)
[name], [name] ([id number]) Visit Report for 04/11/2017 Chief Complaint Document Details Patient Name: NEVENA, ROZENBERG Date of Service: 04/11/2017 12:45 PM Medical Record Patient Account Number: [id number] Number: Treating RN: Baruch Gouty, RN, BSN, Rita 09/15/1956 [phone] y.o. Other Clinician: Date of Birth/Sex: Female) Treating ROBSON, MICHAEL Primary Care Provider: [name] Provider/Extender: G Referring Provider: Rodney Langton in Treatment: 0 Information Obtained from: Patient Chief Complaint Patient presents to the wound care center for a consult due non healing wound. The patient was known to have bilateral lymphedema as a status post mastectomy syndrome now comes with an ulcerated area of her right upper arm and back which she's had for several weeks. 04/11/17; patient is here for review of wounds on her back Electronic Signature(s) Signed: 04/11/2017 5:27:39 PM By: Linton Ham MD Entered By: Linton Ham on 04/11/2017 17:16:43 [name] ([id number]) -------------------------------------------------------------------------------- HPI Details Patient Name: [name] Date of Service: 04/11/2017 12:45 PM Medical Record Patient Account Number: [id number] Number: Treating RN: Baruch Gouty, RN, BSN, Rita 1955-12-29 [phone] y.o. Other Clinician: Date of Birth/Sex: Female) Treating ROBSON, MICHAEL Primary Care Provider: [name] Provider/Extender: G Referring Provider: [name] Weeks in Treatment: 0 History of Present Illness Location: bilateral arm lymphedema with now skin ulceration of the right upper extremity. Quality: Patient reports experiencing a dull pain to affected area(s). Severity: Patient states wound are getting worse. Duration: Patient has had the wound for > 18 months prior to seeking treatment at the wound center Timing: Pain in wound is Intermittent (comes and goes Context: The wound occurred when the patiento. Modifying  Factors: Consults to this date include: local care and antibiotics as prescribed by the PCP and the ER. Associated Signs and Symptoms: Patient reports having increase discharge. HPI Description: 61 year old patient known to [name] from a prior visit about a year ago, returns for care. she is known to have stage IV breast cancer, angiosarcoma and has been on treatment with the medical oncologist Dr. Jana Hakim. She has been seeing the hospice nurses earlier and has been continuing with chemotherapy. She was known to have stage IV breast cancer, originally diagnosed in April 2003 and now manifested chiefly by loco-regional nodal disease (neck, chest), without lung, liver, bone, or brain involvement. he has received extensive radiation and chemotherapy after the surgeries. She's always had bilateral lymphedema of her arms but now comes with an ulcerated area on her right upper arm posteriorly. Her medical Oncologist, Dr.Gustav [name], was concerned that what we may be seeing in Cherika's right upper extremity is actually skin spread of her cancer. This needs to be biopsied and she was sending her back to general surgery with that in mind. She placed a referral to the wound clinic . She has been on doxycycline for now. The general surgeon saw her 2 days ago and did a biopsy and this report is pending. In April 2016 she had a right upper arm venous duplex study which showed: No evidence of deep vein or superficial thrombosis involving the ooright upper extremity and left subclavian vein. Moderate edema noted throughout the right upper extremity. 05/01/2015 the patient tells me that her surgeon revealed that her biopsy was negative and that there was no pathology found. Other than that she is doing well and she thinks her edema has gone down a bit. I had done a biopsy for her about a year ago and final report is not back yet but the pathologist Dr. Chrystine Oiler did speak to me on the phone a few days ago and  mentioned that he is doing final stains and there may be a mixture of a angiosarcoma and metastatic breast cancer. I have not been able to speak to Dr. Jana Hakim yet but I understand the patient has seen him today and he has discussed the provisional pathology and offered her some chemotherapy. The final pathology report is back and it shows a metastatic breast carcinoma and an atypical vascular proliferation. 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 She informs me that she is going to [facility] to the [place] for further care. [name], [name] ([id number]) 08/19/16: pt returns today for f/u. she attended the cancer center in [place]. The right arm wound has healed and significant reduction in wound size regarding he right back. She denies systemic s/s of infection. 04/11/17 3 ADMISSION this is a patient who has been seen in this clinic previously lymphedema and an ulcerated area on her right arm. Apparently biopsy of this area showed possible angiosarcoma. She is also a patient who is battled metastatic stage IV breast cancer dating back to 2003-5. She now has bilateral mastectomies. Most of her metastasis of been in the lymph nodes and chest. At the time she was in the clinic in the fall of 17 she went to the [facility] [place] in [place] and returned with a much better condition of her arm and right upper back. I have reviewed notes from [facility] oncology Dr. Jana Hakim she has developed over the course of this year multiple cutaneous nodules on the right upper back. Some of these have ulcerated and fact today she has 3 open areas one of which has a necrotic surface and 2 smaller areas. There are other nodules palpable the do not have openings. She is not really symptomatic from this. She wonders about why these have occurred and what can be done about them. I don't really have a good sense of how these areas have responded to chemotherapy however it is clear from  reviewing the notes that these are felt to be metastatic lesions. Presumably metastatic breast cancer to skin Electronic Signature(s) Signed: 04/11/2017 5:27:39 PM By: Linton Ham MD Entered By: Linton Ham on 04/11/2017 17:20:40 [name] ([id number]) -------------------------------------------------------------------------------- Physical Exam Details Patient Name: [name] Date of Service: 04/11/2017 12:45 PM Medical Record Patient Account Number: [id number] Number: Treating RN: Baruch Gouty, RN, BSN, Rita 03-15-1956 [phone] y.o. Other Clinician: Date of Birth/Sex: Female) Treating ROBSON, MICHAEL Primary Care Provider: [name] Provider/Extender: G Referring Provider: [name] Weeks in Treatment: 0 Constitutional Sitting or standing Blood Pressure is within target range for patient.. Pulse regular and within target range for patient.Marland Kitchen Respirations regular, non-labored and within target range.. Temperature is normal and within the target range for the patient.Marland Kitchen appears in no distress. Respiratory Respiratory effort is easy and symmetric bilaterally. Rate is normal at rest and on room air.. Cardiovascular Heart rhythm and rate regular, without murmur or gallop.Marland Kitchen Lymphatic No nodes were palpable in her cervical clavicular or axillary areas. At least in the right axilla she's had lymph node dissections.. Integumentary (Hair, Skin) Alterable nodules mostly centered on the right upper back. 3 of these have cutaneous Spring skin breakdown. These are nontender. Some are mobile and some are fixed.Marland Kitchen Notes Wound exam; the area is to the right of her upper thoracic spine. Multiple firm cutaneous nodules. There are at least 3 areas of skin ulceration that accounted one of them of any size is covered with a necrotic surface. There is several other nodules that do  not have skin breakdown. She seems asymptomatic in this area. Electronic Signature(s) Signed:  04/11/2017 5:27:39 PM By: Linton Ham MD Entered By: Linton Ham on 04/11/2017 17:23:07 [name] ([id number]) -------------------------------------------------------------------------------- Physician Orders Details Patient Name: [name] Date of Service: 04/11/2017 12:45 PM Medical Record Patient Account Number: [id number] Number: Treating RN: Baruch Gouty, RN, BSN, Rita 10/21/1956 [phone] y.o. Other Clinician: Date of Birth/Sex: Female) Treating ROBSON, MICHAEL Primary Care Provider: [name] Provider/Extender: G Referring Provider: Rodney Langton in Treatment: 0 Verbal / Phone Orders: No Diagnosis Coding Wound Cleansing Wound #4 Back o Clean wound with Normal Saline. Primary Wound Dressing Wound #4 Back o Aquacel Ag Secondary Dressing Wound #4 Back o Dry Gauze o Boardered Foam Dressing Dressing Change Frequency Wound #4 Back o Change dressing every other day. Follow-up Appointments Wound #4 Back o Return Appointment in 1 week. Additional Orders / Instructions Wound #4 Back o Increase protein intake. o Activity as tolerated Electronic Signature(s) Signed: 04/11/2017 4:40:28 PM By: Regan Lemming BSN, RN Signed: 04/11/2017 5:27:39 PM By: Linton Ham MD Entered By: Regan Lemming on 04/11/2017 13:47:55 [name] ([id number]) -------------------------------------------------------------------------------- Problem List Details Patient Name: [name] Date of Service: 04/11/2017 12:45 PM Medical Record Patient Account Number: [id number] Number: Treating RN: Baruch Gouty, RN, BSN, Rita Nov 05, 1956 [phone] y.o. 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 04/11/2017 Yes Z85.3 Personal history of malignant neoplasm of breast 04/11/2017 Yes C49.11 Malignant neoplasm of connective and soft tissue of right 04/11/2017 Yes upper limb, including shoulder Inactive Problems Resolved Problems Electronic Signature(s) Signed: 04/11/2017 5:27:39 PM By: Linton Ham MD Entered By: Linton Ham on 04/11/2017 17:08:55 [name] ([id number]) -------------------------------------------------------------------------------- Progress Note Details Patient Name: [name] Date of Service: 04/11/2017 12:45 PM Medical Record Patient Account Number: [id number] Number: Treating RN: Baruch Gouty, RN, BSN, Rita [birth year] [phone] y.o. Other Clinician: Date of Birth/Sex: Female) Treating ROBSON, MICHAEL Primary Care Provider: [name] Provider/Extender: G Referring Provider: Rodney Langton in Treatment: 0 Subjective Chief Complaint Information obtained from Patient Patient presents to the wound care center for a consult due non healing wound. The patient was known to have bilateral lymphedema as a status post mastectomy syndrome now comes with an ulcerated area of her right upper arm and back which she's had for several weeks. 04/11/17; patient is here for review of wounds on her back History of Present Illness (HPI) The following HPI elements were documented for the patient's wound: Location: bilateral arm lymphedema with now skin ulceration of the right upper extremity. Quality: Patient reports experiencing a dull pain to affected area(s). Severity: Patient states wound are getting worse. Duration: Patient has had the wound for > 18 months prior to seeking treatment at the wound center Timing: Pain in wound is Intermittent (comes and goes Context: The wound occurred when the patient . Modifying Factors: Consults to this date include: local care and antibiotics as prescribed by the PCP and the ER. Associated Signs and  Symptoms: Patient reports having increase discharge. 61 year old patient known to [name] from a prior visit about a year ago, returns for care. she is known to have stage IV breast cancer, angiosarcoma and has been on treatment with the medical oncologist Dr. Jana Hakim. She has been seeing the hospice nurses earlier and has been continuing with chemotherapy. She  was known to have stage IV breast cancer, originally diagnosed in April 2003 and now manifested chiefly by loco-regional nodal disease (neck, chest), without lung, liver, bone, or brain involvement. he has received extensive radiation and chemotherapy after the surgeries. She's always had bilateral lymphedema of her arms but now comes with an ulcerated area on her right upper arm posteriorly. Her medical Oncologist, Dr.Gustav [name], was concerned that what we may be seeing in Cloteal's right upper extremity is actually skin spread of her cancer. This needs to be biopsied and she was sending her back to general surgery with that in mind. She placed a referral to the wound clinic . She has been on doxycycline for now. The general surgeon saw her 2 days ago and did a biopsy and this report is pending. In April 2016 she had a right upper arm venous duplex study which showed: No evidence of deep vein or superficial thrombosis involving the right upper extremity and left subclavian vein. Moderate edema noted throughout the right upper extremity. 05/01/2015 the patient tells me that her surgeon revealed that her biopsy was negative and that there was no pathology found. Other than that she is doing well and she thinks her edema has gone down a bit. [name], [name] ([id number]) I had done a biopsy for her about a year ago and final report is not back yet but the pathologist Dr. Chrystine Oiler did speak to me on the phone a few days ago and mentioned that he is doing final stains and there may be a mixture of a angiosarcoma and metastatic breast cancer. I  have not been able to speak to Dr. Jana Hakim yet but I understand the patient has seen him today and he has discussed the provisional pathology and offered her some chemotherapy. The final pathology report is back and it shows a metastatic breast carcinoma and an atypical vascular proliferation. 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 She informs me that she is going to [facility] to the [place] for further care. 08/19/16: pt returns today for f/u. she attended the cancer center in [place]. The right arm wound has healed and significant reduction in wound size regarding he right back. She denies systemic s/s of infection. 04/11/17 3 ADMISSION this is a patient who has been seen in this clinic previously lymphedema and an ulcerated area on her right arm. Apparently biopsy of this area showed possible angiosarcoma. She is also a patient who is battled metastatic stage IV breast cancer dating back to 2003-5. She now has bilateral mastectomies. Most of her metastasis of been in the lymph nodes and chest. At the time she was in the clinic in the fall of 17 she went to the [facility] [place] in [place] and returned with a much better condition of her arm and right upper back. I have reviewed notes from [facility] oncology Dr. Jana Hakim she has developed over the course of this year multiple cutaneous nodules on the right upper back. Some of these have ulcerated and fact today she has 3 open areas one of which has a necrotic surface and 2 smaller areas. There are other nodules palpable the do not have openings. She is not really symptomatic from this. She wonders about why these have occurred and what can be done about them. I don't really have a good sense of how these areas have responded to chemotherapy however it is clear from reviewing the notes that these are felt to be metastatic lesions. Presumably metastatic  breast cancer to skin Patient History Information obtained  from Patient. Allergies No Known Allergies Family History Cancer - Siblings, Mother, Kidney Disease - Siblings, No family history of Diabetes, Heart Disease, Hereditary Spherocytosis, Hypertension, Lung Disease, Seizures, Stroke, Thyroid Problems, Tuberculosis. Social History Current every day smoker, Marital Status - Married, Alcohol Use - Never, Drug Use - No History, Caffeine Use - Daily. Medical History Hematologic/Lymphatic Patient has history of Lymphedema Psychiatric Patient has history of Confinement Anxiety Cespedes, Jorgia ([id number]) Medical And Surgical History Notes Musculoskeletal rheumatic fever Oncologic breast cancer post bilateral mastectomy Review of Systems (ROS) Eyes Complains or has symptoms of Glasses / Contacts. Ear/Nose/Mouth/Throat The patient has no complaints or symptoms. Hematologic/Lymphatic The patient has no complaints or symptoms. Respiratory The patient has no complaints or symptoms. Cardiovascular The patient has no complaints or symptoms. Gastrointestinal The patient has no complaints or symptoms. Endocrine The patient has no complaints or symptoms. Genitourinary The patient has no complaints or symptoms. Immunological The patient has no complaints or symptoms. Integumentary (Skin) Complains or has symptoms of Wounds, Swelling. Musculoskeletal The patient has no complaints or symptoms. Neurologic The patient has no complaints or symptoms. Oncologic The patient has no complaints or symptoms. Psychiatric Complains or has symptoms of Anxiety. Objective Constitutional Sitting or standing Blood Pressure is within target range for patient.. Pulse regular and within target range for patient.Marland Kitchen Respirations regular, non-labored and within target range.. Temperature is normal and within the target range for the patient.Marland Kitchen appears in no distress. Vitals Time Taken: 1:08 PM, Height: 66 in, Temperature: 98.3 F, Pulse: 70 bpm, Respiratory  Rate: 18 breaths/min, Blood Pressure: 125/68 mmHg. [name], [name] ([id number]) Respiratory Respiratory effort is easy and symmetric bilaterally. Rate is normal at rest and on room air.. Cardiovascular Heart rhythm and rate regular, without murmur or gallop.Marland Kitchen Lymphatic No nodes were palpable in her cervical clavicular or axillary areas. At least in the right axilla she's had lymph node dissections.. General Notes: Wound exam; the area is to the right of her upper thoracic spine. Multiple firm cutaneous nodules. There are at least 3 areas of skin ulceration that accounted one of them of any size is covered with a necrotic surface. There is several other nodules that do not have skin breakdown. She seems asymptomatic in this area. Integumentary (Hair, Skin) Alterable nodules mostly centered on the right upper back. 3 of these have cutaneous Spring skin breakdown. These are nontender. Some are mobile and some are fixed.. Wound #4 status is Open. Original cause of wound was Gradually Appeared. The wound is located on the Back. The wound measures 8.2cm length x 2.5cm width x 0.3cm depth; 16.101cm^2 area and 4.83cm^3 volume. There is Fat Layer (Subcutaneous Tissue) Exposed exposed. There is no tunneling or undermining noted. There is a medium amount of serosanguineous drainage noted. The wound margin is indistinct and nonvisible. There is small (1-33%) pale granulation within the wound bed. There is a large (67-100%) amount of necrotic tissue within the wound bed including Adherent Slough. The periwound skin appearance did not exhibit: Callus, Crepitus, Excoriation, Induration, Rash, Scarring, Dry/Scaly, Maceration, Atrophie Blanche, Cyanosis, Ecchymosis, Hemosiderin Staining, Mottled, Pallor, Rubor, Erythema. Periwound temperature was noted as No Abnormality. The periwound has tenderness on palpation. Assessment Active Problems ICD-10 [phone] - Non-pressure chronic ulcer of back with fat layer  exposed Z85.3 - Personal history of malignant neoplasm of breast C49.11 - Malignant neoplasm of connective and soft tissue of right upper limb, including shoulder Plan Borner, Dorathea ([id number]) Wound Cleansing: Wound #4  Back: Clean wound with Normal Saline. Primary Wound Dressing: Wound #4 Back: Aquacel Ag Secondary Dressing: Wound #4 Back: Dry Gauze Boardered Foam Dressing Dressing Change Frequency: Wound #4 Back: Change dressing every other day. Follow-up Appointments: Wound #4 Back: Return Appointment in 1 week. Additional Orders / Instructions: Wound #4 Back: Increase protein intake. Activity as tolerated #1 we used Aquacel Ag and border foam to this area. #2 I suspect this is all metastatic breast cancer to skin in this area although I don't see this specifically stated in any of the recent oncology notes that I reviewed. There was no biopsy results. She also has what appeared to be an angiosarcoma based on a biopsy of her arm she had in 2016/right arm. #3 I did not do any debridement of this area. These are clearly tumors and probably breast cancer. I am not optimistic will be able to affect any "healing here"however I don't see an actual biopsy of this area. I wondered whether local radiation might be effective in preventing what might be a difficult cosmetic situation in this area if the tumors continued to break down. Electronic Signature(s) Signed: 04/11/2017 5:27:39 PM By: Linton Ham MD Entered By: Linton Ham on 04/11/2017 17:26:05 [name] ([id number]) -------------------------------------------------------------------------------- ROS/PFSH Details Patient Name: [name] Date of Service: 04/11/2017 12:45 PM Medical Record Patient Account Number: [id number] Number: Treating RN: Baruch Gouty, RN, BSN, Rita November 28, 1955 [phone] y.o. Other Clinician: Date of Birth/Sex: Female) Treating ROBSON, MICHAEL Primary Care Provider: [name] Provider/Extender: G Referring Provider: [name] Weeks in Treatment: 0 Information Obtained From Patient Wound History Eyes Complaints and Symptoms: Positive for: Glasses / Contacts Medical History: Negative for: Cataracts; Glaucoma; Optic Neuritis Integumentary (Skin) Complaints and Symptoms: Positive for: Wounds; Swelling Medical History: Negative for: History of Burn; History of pressure wounds Psychiatric Complaints and Symptoms: Positive for: Anxiety Medical History: Positive for: Confinement Anxiety Ear/Nose/Mouth/Throat Complaints and Symptoms: No Complaints or Symptoms Medical History: Negative for: Chronic sinus problems/congestion; Middle ear problems Hematologic/Lymphatic Complaints and Symptoms: No Complaints or Symptoms Medical HistorySHARONA, [name] ([id number]) Positive for: Lymphedema Negative for: Anemia; Hemophilia; Human Immunodeficiency Virus; Sickle Cell Disease Respiratory Complaints and Symptoms: No Complaints or Symptoms Medical History: Negative for: Aspiration; Asthma; Chronic Obstructive Pulmonary Disease (COPD); Pneumothorax; Sleep Apnea; Tuberculosis Cardiovascular Complaints and Symptoms: No Complaints or Symptoms Medical History: Positive for: Deep Vein Thrombosis - right arm; Hypertension Negative for: Angina; Arrhythmia; Congestive Heart Failure; Coronary Artery Disease; Hypotension; Myocardial Infarction; Peripheral Arterial Disease; Peripheral Venous Disease; Phlebitis; Vasculitis Gastrointestinal Complaints and Symptoms: No Complaints or Symptoms Medical History: Negative for: Cirrhosis ; Colitis; Crohnos; Hepatitis A; Hepatitis B; Hepatitis C Endocrine Complaints and Symptoms: No Complaints or Symptoms Medical History: Negative for: Type I Diabetes; Type II Diabetes Genitourinary Complaints and Symptoms: No Complaints or Symptoms Medical History: Negative for: End Stage Renal  Disease Immunological Complaints and Symptoms: No Complaints or Symptoms Medical HistorySAMERIA, [name] ([id number]) Negative for: Lupus Erythematosus; Raynaudos; Scleroderma Musculoskeletal Complaints and Symptoms: No Complaints or Symptoms Medical History: Negative for: Gout; Rheumatoid Arthritis; Osteoarthritis; Osteomyelitis Past Medical History Notes: rheumatic fever Neurologic Complaints and Symptoms: No Complaints or Symptoms Medical History: Negative for: Dementia; Neuropathy; Quadriplegia; Paraplegia; Seizure Disorder Oncologic Complaints and Symptoms: No Complaints or Symptoms Medical History: Positive for: Received Chemotherapy - 2014 last dose, current; Received Radiation - 2003 Past Medical History Notes: breast cancer post bilateral mastectomy Immunizations Pneumococcal Vaccine: Received Pneumococcal Vaccination: No Family and Social History Cancer: Yes - Siblings, Mother; Diabetes: No; Heart Disease: No;  Hereditary Spherocytosis: No; Hypertension: No; Kidney Disease: Yes - Siblings; Lung Disease: No; Seizures: No; Stroke: No; Thyroid Problems: No; Tuberculosis: No; Current every day smoker; Marital Status - Married; Alcohol Use: Never; Drug Use: No History; Caffeine Use: Daily; Financial Concerns: No; Food, Clothing or Shelter Needs: No; Support System Lacking: No; Transportation Concerns: No; Advanced Directives: No; Patient does not want information on Advanced Directives; Medical Power of Attorney: No Electronic Signature(s) Signed: 04/11/2017 4:40:28 PM By: Regan Lemming BSN, RN Signed: 04/11/2017 5:27:39 PM By: Linton Ham MD Entered By: Regan Lemming on 04/11/2017 12:59:08 [name] ([id number]) -------------------------------------------------------------------------------- SuperBill Details Patient Name: [name] Date of Service: 04/11/2017 Medical Record Patient Account Number: [id number] Number: Treating RN: Baruch Gouty, RN, BSN,  Rita November 03, 1956 [id number] y.o. 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

## 2017-04-14 ENCOUNTER — Encounter: Payer: Self-pay | Admitting: Oncology

## 2017-04-14 NOTE — Progress Notes (Signed)
Received voicemail from [email or web address] regarding PA request for patient.  Called pharmacy(Clement) to advise of information needed to process request. He states not to worry about it because he ran through other insurance and received a paid claim.

## 2017-04-16 IMAGING — CR DG CHEST 2V
2 series · 2 of 2 positions shown · non-contrast
Comparison: 07/30/2016 CXR

CLINICAL DATA: Abdominal breast cancer.  Weakness.

EXAM:
CHEST  2 VIEW

[w chest pa]
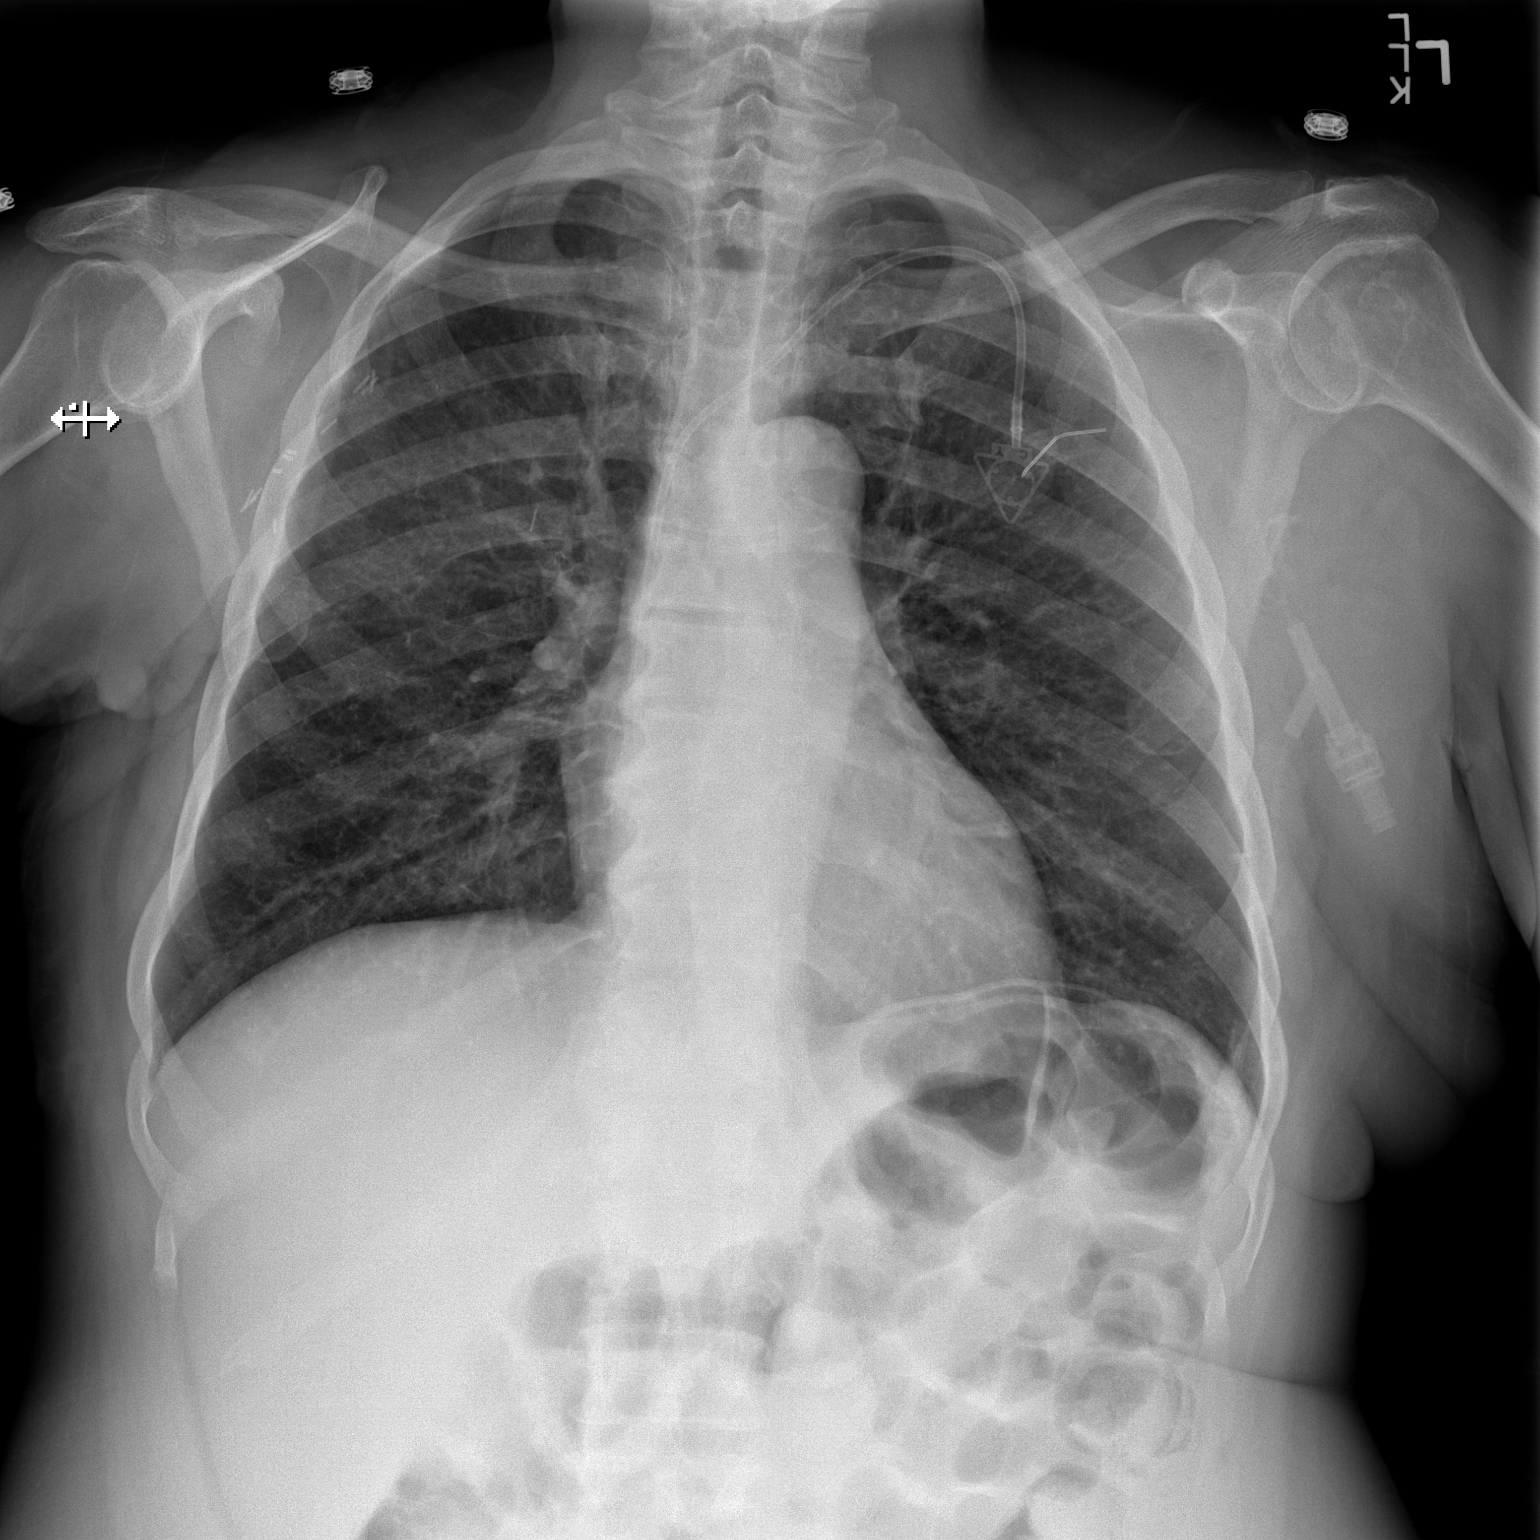

[w chest lat]
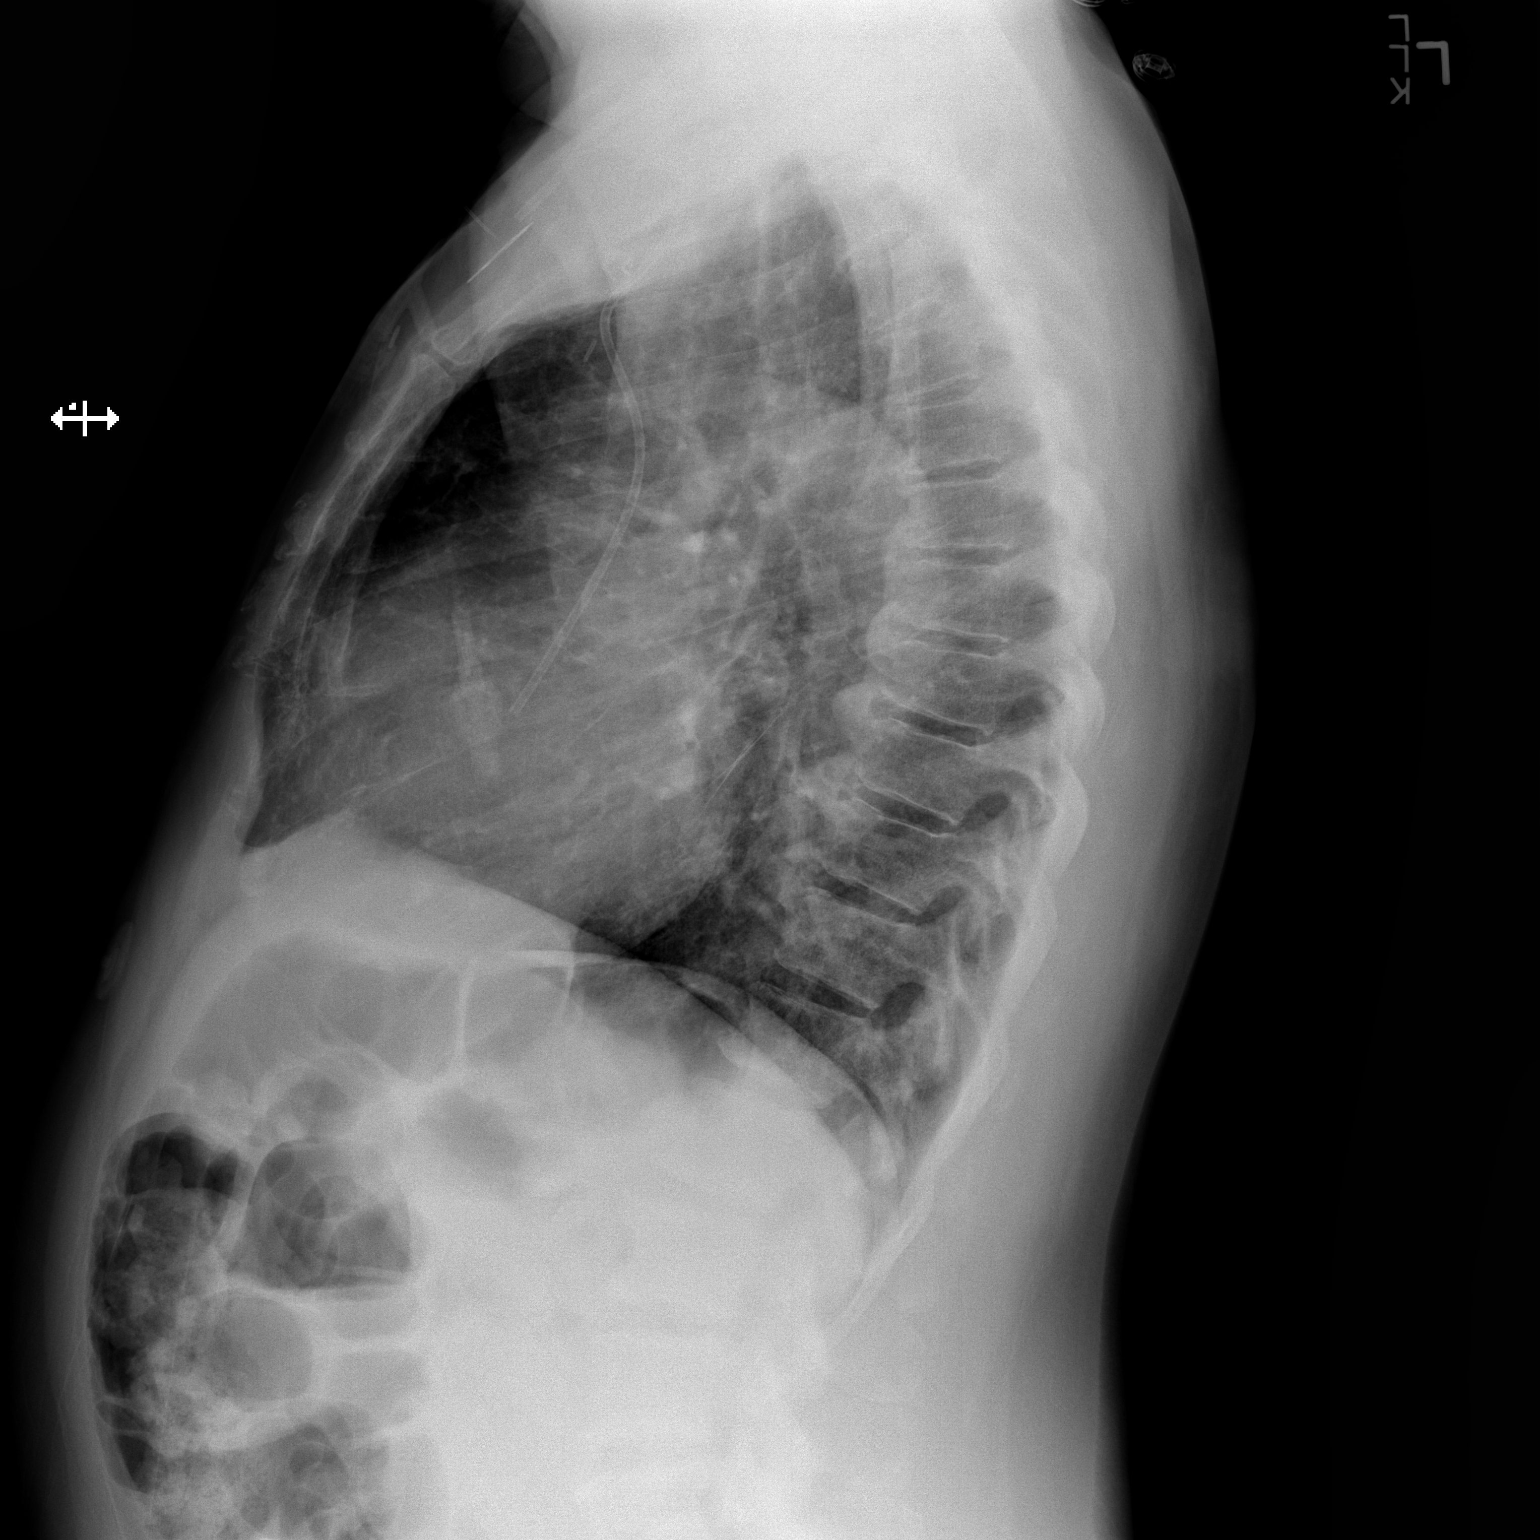

[2 of 2 positions shown; findings below may reference images not displayed]

FINDINGS: The heart size and mediastinal contours are within normal limits.
Surgical clips project over the right axilla. Port catheter tip is
seen at the cavoatrial junction. Both lungs are clear. The
visualized skeletal structures are unremarkable.
IMPRESSION: No active cardiopulmonary disease.

## 2017-04-25 ENCOUNTER — Encounter: Payer: Commercial Managed Care - PPO | Admitting: Internal Medicine

## 2017-04-25 DIAGNOSIS — C4911 Malignant neoplasm of connective and soft tissue of right upper limb, including shoulder: Secondary | ICD-10-CM | POA: Diagnosis not present

## 2017-04-25 DIAGNOSIS — C44509 Unspecified malignant neoplasm of skin of other part of trunk: Secondary | ICD-10-CM | POA: Diagnosis not present

## 2017-04-27 ENCOUNTER — Other Ambulatory Visit (HOSPITAL_BASED_OUTPATIENT_CLINIC_OR_DEPARTMENT_OTHER): Payer: Commercial Managed Care - PPO

## 2017-04-27 ENCOUNTER — Ambulatory Visit (HOSPITAL_BASED_OUTPATIENT_CLINIC_OR_DEPARTMENT_OTHER): Payer: Commercial Managed Care - PPO | Admitting: Oncology

## 2017-04-27 ENCOUNTER — Ambulatory Visit: Payer: Commercial Managed Care - PPO

## 2017-04-27 ENCOUNTER — Ambulatory Visit (HOSPITAL_BASED_OUTPATIENT_CLINIC_OR_DEPARTMENT_OTHER): Payer: Commercial Managed Care - PPO

## 2017-04-27 ENCOUNTER — Encounter: Payer: Self-pay | Admitting: Oncology

## 2017-04-27 VITALS — BP 114/66 | HR 75 | Temp 98.6°F | Resp 18 | Ht 66.0 in | Wt 173.3 lb

## 2017-04-27 DIAGNOSIS — Z95828 Presence of other vascular implants and grafts: Secondary | ICD-10-CM

## 2017-04-27 DIAGNOSIS — Z5112 Encounter for antineoplastic immunotherapy: Secondary | ICD-10-CM

## 2017-04-27 DIAGNOSIS — C50211 Malignant neoplasm of upper-inner quadrant of right female breast: Secondary | ICD-10-CM

## 2017-04-27 DIAGNOSIS — I890 Lymphedema, not elsewhere classified: Secondary | ICD-10-CM | POA: Diagnosis not present

## 2017-04-27 DIAGNOSIS — C4911 Malignant neoplasm of connective and soft tissue of right upper limb, including shoulder: Secondary | ICD-10-CM

## 2017-04-27 DIAGNOSIS — Z171 Estrogen receptor negative status [ER-]: Secondary | ICD-10-CM

## 2017-04-27 DIAGNOSIS — Z5111 Encounter for antineoplastic chemotherapy: Secondary | ICD-10-CM | POA: Diagnosis not present

## 2017-04-27 DIAGNOSIS — G893 Neoplasm related pain (acute) (chronic): Secondary | ICD-10-CM

## 2017-04-27 DIAGNOSIS — Z86711 Personal history of pulmonary embolism: Secondary | ICD-10-CM

## 2017-04-27 DIAGNOSIS — C778 Secondary and unspecified malignant neoplasm of lymph nodes of multiple regions: Secondary | ICD-10-CM | POA: Diagnosis not present

## 2017-04-27 LAB — CBC WITH DIFFERENTIAL/PLATELET
BASO%: 0.6 % (ref 0.0–2.0)
Basophils Absolute: 0 10*3/uL (ref 0.0–0.1)
EOS%: 5.1 % (ref 0.0–7.0)
Eosinophils Absolute: 0.3 10*3/uL (ref 0.0–0.5)
HCT: 36.3 % (ref 34.8–46.6)
HGB: 11.3 g/dL — ABNORMAL LOW (ref 11.6–15.9)
LYMPH%: 26.9 % (ref 14.0–49.7)
MCH: 28 pg (ref 25.1–34.0)
MCHC: 31.1 g/dL — ABNORMAL LOW (ref 31.5–36.0)
MCV: 90.1 fL (ref 79.5–101.0)
MONO#: 0.9 10*3/uL (ref 0.1–0.9)
MONO%: 14.2 % — ABNORMAL HIGH (ref 0.0–14.0)
NEUT#: 3.4 10*3/uL (ref 1.5–6.5)
NEUT%: 53.2 % (ref 38.4–76.8)
Platelets: 191 10*3/uL (ref 145–400)
RBC: 4.03 10*6/uL (ref 3.70–5.45)
RDW: 15.5 % — ABNORMAL HIGH (ref 11.2–14.5)
WBC: 6.4 10*3/uL (ref 3.9–10.3)
lymph#: 1.7 10*3/uL (ref 0.9–3.3)

## 2017-04-27 LAB — COMPREHENSIVE METABOLIC PANEL
ALT: 15 U/L (ref 0–55)
AST: 21 U/L (ref 5–34)
Albumin: 3.3 g/dL — ABNORMAL LOW (ref 3.5–5.0)
Alkaline Phosphatase: 103 U/L (ref 40–150)
Anion Gap: 12 mEq/L — ABNORMAL HIGH (ref 3–11)
BUN: 12.6 mg/dL (ref 7.0–26.0)
CO2: 31 mEq/L — ABNORMAL HIGH (ref 22–29)
Calcium: 9.3 mg/dL (ref 8.4–10.4)
Chloride: 99 mEq/L (ref 98–109)
Creatinine: 0.9 mg/dL (ref 0.6–1.1)
EGFR: 84 mL/min/{1.73_m2} — ABNORMAL LOW (ref >90)
Glucose: 105 mg/dl (ref 70–140)
Potassium: 3.5 mEq/L (ref 3.5–5.1)
Sodium: 142 mEq/L (ref 136–145)
Total Bilirubin: 0.22 mg/dL (ref 0.20–1.20)
Total Protein: 7.1 g/dL (ref 6.4–8.3)

## 2017-04-27 MED ORDER — SODIUM CHLORIDE 0.9 % IV SOLN
420.0000 mg | Freq: Once | INTRAVENOUS | Status: AC
  Administered 2017-04-27: 420 mg via INTRAVENOUS
  Filled 2017-04-27: qty 14

## 2017-04-27 MED ORDER — TRASTUZUMAB CHEMO INJECTION 440 MG
450.0000 mg | Freq: Once | INTRAVENOUS | Status: AC
  Administered 2017-04-27: 450 mg via INTRAVENOUS
  Filled 2017-04-27: qty 21.43

## 2017-04-27 MED ORDER — SODIUM CHLORIDE 0.9 % IV SOLN
Freq: Once | INTRAVENOUS | Status: AC
  Administered 2017-04-27: 12:00:00 via INTRAVENOUS

## 2017-04-27 MED ORDER — DIPHENHYDRAMINE HCL 25 MG PO CAPS
25.0000 mg | ORAL_CAPSULE | Freq: Once | ORAL | Status: AC
  Administered 2017-04-27: 25 mg via ORAL

## 2017-04-27 MED ORDER — SODIUM CHLORIDE 0.9 % IJ SOLN
10.0000 mL | INTRAMUSCULAR | Status: DC | PRN
  Administered 2017-04-27: 10 mL via INTRAVENOUS
  Filled 2017-04-27: qty 10

## 2017-04-27 MED ORDER — DIPHENHYDRAMINE HCL 25 MG PO CAPS
ORAL_CAPSULE | ORAL | Status: AC
  Filled 2017-04-27: qty 1

## 2017-04-27 MED ORDER — HEPARIN SOD (PORK) LOCK FLUSH 100 UNIT/ML IV SOLN
500.0000 [IU] | Freq: Once | INTRAVENOUS | Status: AC | PRN
  Administered 2017-04-27: 500 [IU]
  Filled 2017-04-27: qty 5

## 2017-04-27 MED ORDER — ACETAMINOPHEN 325 MG PO TABS
650.0000 mg | ORAL_TABLET | Freq: Once | ORAL | Status: AC
  Administered 2017-04-27: 650 mg via ORAL

## 2017-04-27 MED ORDER — PACLITAXEL PROTEIN-BOUND CHEMO INJECTION 100 MG
125.0000 mg/m2 | Freq: Once | INTRAVENOUS | Status: AC
  Administered 2017-04-27: 250 mg via INTRAVENOUS
  Filled 2017-04-27: qty 50

## 2017-04-27 MED ORDER — PROCHLORPERAZINE MALEATE 10 MG PO TABS
10.0000 mg | ORAL_TABLET | Freq: Once | ORAL | Status: AC
  Administered 2017-04-27: 10 mg via ORAL

## 2017-04-27 MED ORDER — ACETAMINOPHEN 325 MG PO TABS
ORAL_TABLET | ORAL | Status: AC
  Filled 2017-04-27: qty 2

## 2017-04-27 MED ORDER — PROCHLORPERAZINE MALEATE 10 MG PO TABS
ORAL_TABLET | ORAL | Status: AC
  Filled 2017-04-27: qty 1

## 2017-04-27 MED ORDER — SODIUM CHLORIDE 0.9% FLUSH
10.0000 mL | INTRAVENOUS | Status: DC | PRN
  Administered 2017-04-27: 10 mL
  Filled 2017-04-27: qty 10

## 2017-04-27 NOTE — Patient Instructions (Signed)
[facility] Discharge Instructions for Patients Receiving Chemotherapy  Today you received the following chemotherapy agents :  Herceptin, Perjeta, and Abraxane.  To help prevent nausea and vomiting after your treatment, we encourage you to take your nausea medication as prescribed.  Take Zofran 8 mg by mouth every 12 hours as needed for nausea.  Can also take Compazine 10 mg by mouth every 6 hours as needed for nausea.  DO NOT Drive after taking Compazine as it can cause drowsiness.  DO NOT Eat  Greasy  Nor  Spicy  Foods.  DO Drink  Lots  Of  Fluids  As  Tolerated.   If you develop nausea and vomiting that is not controlled by your nausea medication, call the clinic.   BELOW ARE SYMPTOMS THAT SHOULD BE REPORTED IMMEDIATELY:  *FEVER GREATER THAN 100.5 F  *CHILLS WITH OR WITHOUT FEVER  NAUSEA AND VOMITING THAT IS NOT CONTROLLED WITH YOUR NAUSEA MEDICATION  *UNUSUAL SHORTNESS OF BREATH  *UNUSUAL BRUISING OR BLEEDING  TENDERNESS IN MOUTH AND THROAT WITH OR WITHOUT PRESENCE OF ULCERS  *URINARY PROBLEMS  *BOWEL PROBLEMS  UNUSUAL RASH Items with * indicate a potential emergency and should be followed up as soon as possible.  Feel free to call the clinic you have any questions or concerns. The clinic phone number is [phone].  Please show the [facility] at check-in to the Emergency Department and triage nurse.

## 2017-04-27 NOTE — Progress Notes (Signed)
Patient came in while I was on the phone doing a prior authorization for medication. She states she needs help getting her air condition fixed. Gave patient my card to contact me and advised we do not have assistance in that area.

## 2017-04-27 NOTE — Progress Notes (Signed)
[name], [name] ([id number]) Visit Report for 04/25/2017 Chief Complaint Document Details Patient Name: [name], [name] Date of Service: 04/25/2017 2:00 PM Medical Record Patient Account Number: [id number] Number: Treating RN: Cornell Barman [birth year] (61 y.o. Other Clinician: Date of Birth/Sex: Female) Treating ROBSON, MICHAEL Primary Care Provider: [name] Provider/Extender: G Referring Provider: Rodney Langton in Treatment: 2 Information Obtained from: Patient Chief Complaint Patient presents to the wound care center for a consult due non healing wound. The patient was known to have bilateral lymphedema as a status post mastectomy syndrome now comes with an ulcerated area of her right upper arm and back which she's had for several weeks. 04/11/17; patient is here for review of wounds on her back Electronic Signature(s) Signed: 04/25/2017 4:47:51 PM By: Linton Ham MD Entered By: Linton Ham on 04/25/2017 14:05:42 [name] ([id number]) -------------------------------------------------------------------------------- Debridement Details Patient Name: [name] Date of Service: 04/25/2017 2:00 PM Medical Record Patient Account Number: [id number] Number: Treating RN: Cornell Barman December 25, 1955 (60 y.o. Other Clinician: Date of Birth/Sex: Female) Treating ROBSON, MICHAEL Primary Care Provider: [name] Provider/Extender: G Referring Provider: Rodney Langton in Treatment: 2 Debridement Performed for Wound #4 Back Assessment: Performed By: Physician Ricard Dillon, MD Debridement: Debridement Pre-procedure Verification/Time Out Yes - 13:55 Taken: Start Time: 13:56 Pain Control: Other : lidocaine 4% Level: Skin/Subcutaneous Tissue Total Area Debrided (L x 2 (cm) x 1.5 (cm) = 3 (cm) W): Tissue and other Viable, Non-Viable, Fibrin/Slough, Subcutaneous material debrided: Instrument: Curette Bleeding:  Minimum Hemostasis Achieved: Pressure End Time: 13:56 Procedural Pain: 0 Post Procedural Pain: 0 Response to Treatment: Procedure was tolerated well Post Debridement Measurements of Total Wound Length: (cm) 2 Width: (cm) 1.5 Depth: (cm) 0.2 Volume: (cm) 0.471 Character of Wound/Ulcer Post Requires Further Debridement Debridement: Post Procedure Diagnosis Same as Pre-procedure Electronic Signature(s) Signed: 04/25/2017 4:47:51 PM By: Linton Ham MD Signed: 04/26/2017 8:26:41 AM By: Gretta Cool, BSN, RN, CWS, Kim RN, BSN Entered By: Linton Ham on 04/25/2017 14:05:32 [name], [name] ([id number]) [name], [name] ([id number]) -------------------------------------------------------------------------------- HPI Details Patient Name: [name] Date of Service: 04/25/2017 2:00 PM Medical Record Patient Account Number: [id number] Number: Treating RN: Cornell Barman [birth year] (60 y.o. Other Clinician: Date of Birth/Sex: Female) Treating ROBSON, MICHAEL Primary Care Provider: [name] Provider/Extender: G Referring Provider: [name] Weeks in Treatment: 2 History of Present Illness Location: bilateral arm lymphedema with now skin ulceration of the right upper extremity. Quality: Patient reports experiencing a dull pain to affected area(s). Severity: Patient states wound are getting worse. Duration: Patient has had the wound for > 18 months prior to seeking treatment at the wound center Timing: Pain in wound is Intermittent (comes and goes Context: The wound occurred when the patiento. Modifying Factors: Consults to this date include: local care and antibiotics as prescribed by the PCP and the ER. Associated Signs and Symptoms: Patient reports having increase discharge. HPI Description: 61 year old patient known to [name] from a prior visit about a year ago, returns for care. she is known to have stage IV breast cancer, angiosarcoma and has been on treatment with  the medical oncologist Dr. Jana Hakim. She has been seeing the hospice nurses earlier and has been continuing with chemotherapy. She was known to have stage IV breast cancer, originally diagnosed in April 2003 and now manifested chiefly by loco-regional nodal disease (neck, chest), without lung, liver, bone, or brain involvement. he has received extensive radiation and chemotherapy after the surgeries. She's always had bilateral lymphedema of her arms but now comes  with an ulcerated area on her right upper arm posteriorly. Her medical Oncologist, Dr.Gustav [name], was concerned that what we may be seeing in Jadzia's right upper extremity is actually skin spread of her cancer. This needs to be biopsied and she was sending her back to general surgery with that in mind. She placed a referral to the wound clinic . She has been on doxycycline for now. The general surgeon saw her 2 days ago and did a biopsy and this report is pending. In April 2016 she had a right upper arm venous duplex study which showed: No evidence of deep vein or superficial thrombosis involving the ooright upper extremity and left subclavian vein. Moderate edema noted throughout the right upper extremity. 05/01/2015 the patient tells me that her surgeon revealed that her biopsy was negative and that there was no pathology found. Other than that she is doing well and she thinks her edema has gone down a bit. I had done a biopsy for her about a year ago and final report is not back yet but the pathologist Dr. Chrystine Oiler did speak to me on the phone a few days ago and mentioned that he is doing final stains and there may be a mixture of a angiosarcoma and metastatic breast cancer. I have not been able to speak to Dr. Jana Hakim yet but I understand the patient has seen him today and he has discussed the provisional pathology and offered her some chemotherapy. The final pathology report is back and it shows a metastatic breast  carcinoma and an atypical vascular proliferation. 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 She informs me that she is going to [facility] to the [place] for further care. [name], [name] ([id number]) 08/19/16: pt returns today for f/u. she attended the cancer center in [place]. The right arm wound has healed and significant reduction in wound size regarding he right back. She denies systemic s/s of infection. 04/11/17 3 ADMISSION this is a patient who has been seen in this clinic previously lymphedema and an ulcerated area on her right arm. Apparently biopsy of this area showed possible angiosarcoma. She is also a patient who is battled metastatic stage IV breast cancer dating back to 2003-5. She now has bilateral mastectomies. Most of her metastasis of been in the lymph nodes and chest. At the time she was in the clinic in the fall of 17 she went to the [facility] [place] in [place] and returned with a much better condition of her arm and right upper back. I have reviewed notes from [facility] oncology Dr. Jana Hakim she has developed over the course of this year multiple cutaneous nodules on the right upper back. Some of these have ulcerated and fact today she has 3 open areas one of which has a necrotic surface and 2 smaller areas. There are other nodules palpable the do not have openings. She is not really symptomatic from this. She wonders about why these have occurred and what can be done about them. I don't really have a good sense of how these areas have responded to chemotherapy however it is clear from reviewing the notes that these are felt to be metastatic lesions. Presumably metastatic breast cancer to skin 04/25/17; 3 open areas last time she was here only one remains today. I think all of this is in the setting of nodular metastatic breast cancer to the skin a note and subcutaneous tissue and the lower back. One open area remains. She sees Dr. Mariea Clonts  next  week Electronic Signature(s) Signed: 04/25/2017 4:47:51 PM By: Linton Ham MD Entered By: Linton Ham on 04/25/2017 14:06:33 [name] ([id number]) -------------------------------------------------------------------------------- Physical Exam Details Patient Name: [name] Date of Service: 04/25/2017 2:00 PM Medical Record Patient Account Number: [id number] Number: Treating RN: Cornell Barman September 19, 1956 (60 y.o. Other Clinician: Date of Birth/Sex: Female) Treating ROBSON, MICHAEL Primary Care Provider: [name] Provider/Extender: G Referring Provider: [name] Weeks in Treatment: 2 Constitutional Sitting or standing Blood Pressure is within target range for patient.. Pulse regular and within target range for patient.Marland Kitchen Respirations regular, non-labored and within target range.. Temperature is normal and within the target range for the patient.Marland Kitchen appears in no distress. Notes Wound exam; the area is on the right upper thoracic spine. She has multiple firm cutaneous nodules one open area is an improvement from last time although I suspect this will be a recurrent issue. The remaining wound had a tightly adherent necrotic surface which I elected to debridement with a #3 curet. Remove necrotic surface tissue hemostasis with direct pressure. Electronic Signature(s) Signed: 04/25/2017 4:47:51 PM By: Linton Ham MD Entered By: Linton Ham on 04/25/2017 14:07:29 [name] ([id number]) -------------------------------------------------------------------------------- Physician Orders Details Patient Name: [name] Date of Service: 04/25/2017 2:00 PM Medical Record Patient Account Number: [id number] Number: Treating RN: Cornell Barman 10/15/1956 (60 y.o. Other Clinician: Date of Birth/Sex: Female) Treating ROBSON, MICHAEL Primary Care Provider: [name] Provider/Extender: G Referring Provider: Rodney Langton in  Treatment: 2 Verbal / Phone Orders: No Diagnosis Coding Wound Cleansing Wound #4 Back o Clean wound with Normal Saline. Primary Wound Dressing Wound #4 Back o Aquacel Ag Secondary Dressing Wound #4 Back o Dry Gauze o Boardered Foam Dressing Dressing Change Frequency Wound #4 Back o Change dressing every other day. Follow-up Appointments Wound #4 Back o Return Appointment in 2 weeks. Additional Orders / Instructions Wound #4 Back o Increase protein intake. o Activity as tolerated Electronic Signature(s) Signed: 04/25/2017 4:47:51 PM By: Linton Ham MD Signed: 04/26/2017 8:26:41 AM By: Gretta Cool, BSN, RN, CWS, Kim RN, BSN Entered By: Gretta Cool, BSN, RN, CWS, Kim on 04/25/2017 14:00:23 [name] ([id number]) -------------------------------------------------------------------------------- Problem List Details Patient Name: [name] Date of Service: 04/25/2017 2:00 PM Medical Record Patient Account Number: [id number] Number: Treating RN: Cornell Barman 1956-08-18 (60 y.o. 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 04/11/2017 Yes Z85.3 Personal history of malignant neoplasm of breast 04/11/2017 Yes C49.11 Malignant neoplasm of connective and soft tissue of right 04/11/2017 Yes upper limb, including shoulder Inactive Problems Resolved Problems Electronic Signature(s) Signed: 04/25/2017 4:47:51 PM By: Linton Ham MD Entered By: Linton Ham on 04/25/2017 14:05:02 [name] ([id number]) -------------------------------------------------------------------------------- Progress Note Details Patient Name: [name] Date of Service: 04/25/2017 2:00 PM Medical  Record Patient Account Number: [id number] Number: Treating RN: Cornell Barman 06-06-1956 (60 y.o. Other Clinician: Date of Birth/Sex: Female) Treating ROBSON, MICHAEL Primary Care Provider: [name] Provider/Extender: G Referring Provider: Rodney Langton in Treatment: 2 Subjective Chief Complaint Information obtained from Patient Patient presents to the wound care center for a consult due non healing wound. The patient was known to have bilateral lymphedema as a status post mastectomy syndrome now comes with an ulcerated area of her right upper arm and back which she's had for several weeks. 04/11/17; patient  is here for review of wounds on her back History of Present Illness (HPI) The following HPI elements were documented for the patient's wound: Location: bilateral arm lymphedema with now skin ulceration of the right upper extremity. Quality: Patient reports experiencing a dull pain to affected area(s). Severity: Patient states wound are getting worse. Duration: Patient has had the wound for > 18 months prior to seeking treatment at the wound center Timing: Pain in wound is Intermittent (comes and goes Context: The wound occurred when the patient . Modifying Factors: Consults to this date include: local care and antibiotics as prescribed by the PCP and the ER. Associated Signs and Symptoms: Patient reports having increase discharge. 61 year old patient known to [name] from a prior visit about a year ago, returns for care. she is known to have stage IV breast cancer, angiosarcoma and has been on treatment with the medical oncologist Dr. Jana Hakim. She has been seeing the hospice nurses earlier and has been continuing with chemotherapy. She was known to have stage IV breast cancer, originally diagnosed in April 2003 and now manifested chiefly by loco-regional nodal disease (neck, chest), without lung, liver, bone, or brain involvement. he has received extensive  radiation and chemotherapy after the surgeries. She's always had bilateral lymphedema of her arms but now comes with an ulcerated area on her right upper arm posteriorly. Her medical Oncologist, Dr.Gustav [name], was concerned that what we may be seeing in Zaryiah's right upper extremity is actually skin spread of her cancer. This needs to be biopsied and she was sending her back to general surgery with that in mind. She placed a referral to the wound clinic . She has been on doxycycline for now. The general surgeon saw her 2 days ago and did a biopsy and this report is pending. In April 2016 she had a right upper arm venous duplex study which showed: No evidence of deep vein or superficial thrombosis involving the right upper extremity and left subclavian vein. Moderate edema noted throughout the right upper extremity. 05/01/2015 the patient tells me that her surgeon revealed that her biopsy was negative and that there was no pathology found. Other than that she is doing well and she thinks her edema has gone down a bit. [name], [name] ([id number]) I had done a biopsy for her about a year ago and final report is not back yet but the pathologist Dr. Chrystine Oiler did speak to me on the phone a few days ago and mentioned that he is doing final stains and there may be a mixture of a angiosarcoma and metastatic breast cancer. I have not been able to speak to Dr. Jana Hakim yet but I understand the patient has seen him today and he has discussed the provisional pathology and offered her some chemotherapy. The final pathology report is back and it shows a metastatic breast carcinoma and an atypical vascular proliferation. 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 She informs me that she is going to [facility] to the [place] for further care. 08/19/16: pt returns today for f/u. she attended the cancer center in [place]. The right arm wound has healed and significant reduction in wound  size regarding he right back. She denies systemic s/s of infection. 04/11/17 3 ADMISSION this is a patient who has been seen in this clinic previously lymphedema and an ulcerated area on her right arm. Apparently biopsy of this area showed possible angiosarcoma. She is also a patient who is battled metastatic stage IV breast cancer dating back  to 2003-5. She now has bilateral mastectomies. Most of her metastasis of been in the lymph nodes and chest. At the time she was in the clinic in the fall of 17 she went to the [facility] [place] in [place] and returned with a much better condition of her arm and right upper back. I have reviewed notes from [facility] oncology Dr. Jana Hakim she has developed over the course of this year multiple cutaneous nodules on the right upper back. Some of these have ulcerated and fact today she has 3 open areas one of which has a necrotic surface and 2 smaller areas. There are other nodules palpable the do not have openings. She is not really symptomatic from this. She wonders about why these have occurred and what can be done about them. I don't really have a good sense of how these areas have responded to chemotherapy however it is clear from reviewing the notes that these are felt to be metastatic lesions. Presumably metastatic breast cancer to skin 04/25/17; 3 open areas last time she was here only one remains today. I think all of this is in the setting of nodular metastatic breast cancer to the skin a note and subcutaneous tissue and the lower back. One open area remains. She sees Dr. Mariea Clonts next week Objective Constitutional Sitting or standing Blood Pressure is within target range for patient.. Pulse regular and within target range for patient.Marland Kitchen Respirations regular, non-labored and within target range.. Temperature is normal and within the target range for the patient.Marland Kitchen appears in no distress. Vitals Time Taken: 1:44 PM, Height: 66 in, Temperature: 98.0  F, Pulse: 73 bpm, Respiratory Rate: 16 breaths/min, Blood Pressure: 114/88 mmHg. General Notes: Wound exam; the area is on the right upper thoracic spine. She has multiple firm cutaneous nodules one open area is an improvement from last time although I suspect this will be a recurrent issue. [name], [name] ([id number]) The remaining wound had a tightly adherent necrotic surface which I elected to debridement with a #3 curet. Remove necrotic surface tissue hemostasis with direct pressure. Integumentary (Hair, Skin) Wound #4 status is Open. Original cause of wound was Gradually Appeared. The wound is located on the Back. The wound measures 2cm length x 1.5cm width x 0.3cm depth; 2.356cm^2 area and 0.707cm^3 volume. There is Fat Layer (Subcutaneous Tissue) Exposed exposed. There is no tunneling or undermining noted. There is a medium amount of serosanguineous drainage noted. The wound margin is indistinct and nonvisible. There is small (1-33%) pale granulation within the wound bed. There is a large (67-100%) amount of necrotic tissue within the wound bed including Adherent Slough. The periwound skin appearance did not exhibit: Callus, Crepitus, Excoriation, Induration, Rash, Scarring, Dry/Scaly, Maceration, Atrophie Blanche, Cyanosis, Ecchymosis, Hemosiderin Staining, Mottled, Pallor, Rubor, Erythema. Periwound temperature was noted as No Abnormality. The periwound has tenderness on palpation. 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 There was a Skin/Subcutaneous Tissue Debridement ([id number]) debridement with total area of 3 sq cm performed by Ricard Dillon, MD. with the  following instrument(s): Curette to remove Viable and Non-Viable tissue/material including Fibrin/Slough and Subcutaneous after achieving pain control using Other (lidocaine 4%). A time out was conducted at 13:55, prior to the start  of the procedure. A Minimum amount of bleeding was controlled with Pressure. The procedure was tolerated well with a pain level of 0 throughout and a pain level of 0 following the procedure. Post Debridement Measurements: 2cm length x 1.5cm width x 0.2cm depth; 0.471cm^3 volume. Character of Wound/Ulcer Post Debridement requires further debridement. Post procedure Diagnosis Wound #4: Same as Pre-Procedure [name], [name] ([id number]) Plan Wound Cleansing: Wound #4 Back: Clean wound with Normal Saline. Primary Wound Dressing: Wound #4 Back: Aquacel Ag Secondary Dressing: Wound #4 Back: Dry Gauze Boardered Foam Dressing Dressing Change Frequency: Wound #4 Back: Change dressing every other day. Follow-up Appointments: Wound #4 Back: Return Appointment in 2 weeks. Additional Orders / Instructions: Wound #4 Back: Increase protein intake. Activity as tolerated #1 we'll continue with Aquacel Ag border foam #2 I suspect this is going to be a recurrent issue nevertheless we appear to be able to what had some closure to some of these open areas Electronic Signature(s) Signed: 04/25/2017 4:47:51 PM By: Linton Ham MD Entered By: Linton Ham on 04/25/2017 14:08:00 [name] ([id number]) -------------------------------------------------------------------------------- SuperBill Details Patient Name: [name] Date of Service: 04/25/2017 Medical Record Patient Account Number: [id number] Number: Treating RN: Cornell Barman 06/04/1956 (60 y.o. 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 Z85.3 Personal history of malignant neoplasm of breast C49.11 Malignant neoplasm of connective and soft tissue of right upper limb, including shoulder Facility Procedures CPT4 Code: [id number] Description: 95188 - [name] SUBQ TISSUE 20 SQ CM/< ICD-10 Description Diagnosis L98.422 Non-pressure chronic ulcer of back with fat laye Z85.3 Personal history of malignant neoplasm of breast Modifier: r exposed Quantity: 1 Physician Procedures CPT4 Code: [id number] Description: 01601 - WC PHYS SUBQ TISS 20 SQ CM ICD-10 Description Diagnosis L98.422 Non-pressure chronic ulcer of back with fat layer Z85.3 Personal history of malignant neoplasm of breast Modifier: exposed Quantity: 1 Electronic Signature(s) Signed: 04/25/2017 4:47:51 PM By: Linton Ham MD Entered By: Linton Ham on 04/25/2017 14:08:19

## 2017-04-27 NOTE — Progress Notes (Signed)
SANJANA, FOLZ ([id number]) Visit Report for 04/25/2017 Arrival Information Details Patient Name: [name], [name] Date of Service: 04/25/2017 2:00 PM Medical Record Patient Account Number: [id number] Number: Treating RN: Cornell Barman [birth year] (60 y.o. Other Clinician: Date of Birth/Sex: Female) Treating ROBSON, MICHAEL Primary Care Amiria Orrison: [name] Danyia Borunda/Extender: G Referring Cheridan Kibler: Rodney Langton in Treatment: 2 Visit Information History Since Last Visit Added or deleted any medications: No Patient Arrived: Ambulatory Any new allergies or adverse reactions: No Arrival Time: 13:42 Had a fall or experienced change in No Accompanied By: daughter activities of daily living that may affect Transfer Assistance: Manual risk of falls: Patient Identification Verified: Yes Signs or symptoms of abuse/neglect since last No Secondary Verification Process Yes visito Completed: Hospitalized since last visit: No Patient Requires Transmission-Based No Has Dressing in Place as Prescribed: Yes Precautions: Pain Present Now: No Patient Has Alerts: No Electronic Signature(s) Signed: 04/26/2017 8:26:41 AM By: Gretta Cool, BSN, RN, CWS, Kim RN, BSN Entered By: Gretta Cool, BSN, RN, CWS, Kim on 04/25/2017 13:43:10 [name] ([id number]) -------------------------------------------------------------------------------- Encounter Discharge Information Details Patient Name: [name] Date of Service: 04/25/2017 2:00 PM Medical Record Patient Account Number: [id number] Number: Treating RN: Cornell Barman 1956/01/13 (60 y.o. Other Clinician: Date of Birth/Sex: Female) Treating ROBSON, MICHAEL Primary Care Oree Hislop: [name] Samar Dass/Extender: G Referring Jabar Krysiak: Rodney Langton in Treatment: 2 Encounter Discharge Information Items Discharge Pain Level: 0 Discharge Condition: Stable Ambulatory Status: Ambulatory Discharge Destination:  Home Transportation: Private Auto daughter and Accompanied By: grandson Schedule Follow-up Appointment: Yes Medication Reconciliation completed and provided to Patient/Care Yes Laritza Vokes: Clinical Summary of Care: Electronic Signature(s) Signed: 04/26/2017 8:26:41 AM By: Gretta Cool, BSN, RN, CWS, Kim RN, BSN Previous Signature: 04/25/2017 2:01:13 PM Version By: Ruthine Dose Entered By: Gretta Cool BSN, RN, CWS, Kim on 04/25/2017 14:01:31 [name] ([id number]) -------------------------------------------------------------------------------- Lower Extremity Assessment Details Patient Name: [name] Date of Service: 04/25/2017 2:00 PM Medical Record Patient Account Number: [id number] Number: Treating RN: Cornell Barman [birth year] (60 y.o. Other Clinician: Date of Birth/Sex: Female) Treating ROBSON, MICHAEL Primary Care Caleb Prigmore: [name] Linkoln Alkire/Extender: G Referring Keiarra Charon: Rodney Langton in Treatment: 2 Electronic Signature(s) Signed: 04/26/2017 8:26:41 AM By: Gretta Cool, BSN, RN, CWS, Kim RN, BSN Entered By: Gretta Cool, BSN, RN, CWS, Kim on 04/25/2017 13:50:24 [name] ([id number]) -------------------------------------------------------------------------------- Multi Wound Chart Details Patient Name: [name] Date of Service: 04/25/2017 2:00 PM Medical Record Patient Account Number: [id number] Number: Treating RN: Cornell Barman [birth year] (60 y.o. Other Clinician: Date of Birth/Sex: Female) Treating ROBSON, MICHAEL Primary Care Jerusalem Wert: [name] Macky Galik/Extender: G Referring Kelcie Currie: [name] Weeks in Treatment: 2 Vital Signs Height(in): 66 Pulse(bpm): 73 Weight(lbs): Blood Pressure 114/88 (mmHg): Body Mass Index(BMI): Temperature(F): 98.0 Respiratory Rate 16 (breaths/min): Photos: [N/A:N/A] Wound Location: Back N/A N/A Wounding Event: Gradually Appeared N/A N/A Primary Etiology: Malignant Wound N/A  N/A Comorbid History: Lymphedema, Deep Vein N/A N/A Thrombosis, Hypertension, Received Chemotherapy, Received Radiation, Confinement Anxiety Date Acquired: 08/17/2016 N/A N/A Weeks of Treatment: 2 N/A N/A Wound Status: Open N/A N/A Measurements L x W x D 2x1.5x0.3 N/A N/A (cm) Area (cm) : 2.356 N/A N/A Volume (cm) : 0.707 N/A N/A % Reduction in Area: 85.40% N/A N/A % Reduction in Volume: 85.40% N/A N/A Classification: Full Thickness Without N/A N/A Exposed Support Structures Exudate Amount: Medium N/A N/A Exudate Type: Serosanguineous N/A N/A Barthel, Valrie ([id number]) Exudate Color: red, brown N/A N/A Wound Margin: Indistinct, nonvisible N/A N/A Granulation Amount: Small (1-33%) N/A N/A Granulation Quality: Pale N/A N/A Necrotic Amount:  Large (67-100%) N/A N/A Exposed Structures: Fat Layer (Subcutaneous N/A N/A Tissue) Exposed: Yes Fascia: No Tendon: No Muscle: No Joint: No Bone: No Epithelialization: None N/A N/A Debridement: Debridement ([id number]- N/A N/A [id number]) Pre-procedure 13:55 N/A N/A Verification/Time Out Taken: Pain Control: Other N/A N/A Tissue Debrided: Fibrin/Slough, N/A N/A Subcutaneous Level: Skin/Subcutaneous N/A N/A Tissue Debridement Area (sq 3 N/A N/A cm): Instrument: Curette N/A N/A Bleeding: Minimum N/A N/A Hemostasis Achieved: Pressure N/A N/A Procedural Pain: 0 N/A N/A Post Procedural Pain: 0 N/A N/A Debridement Treatment Procedure was tolerated N/A N/A Response: well Post Debridement 2x1.5x0.2 N/A N/A Measurements L x W x D (cm) Post Debridement 0.471 N/A N/A Volume: (cm) Periwound Skin Texture: Excoriation: No N/A N/A Induration: No Callus: No Crepitus: No Rash: No Scarring: No Periwound Skin Maceration: No N/A N/A Moisture: Dry/Scaly: No Periwound Skin Color: Atrophie Blanche: No N/A N/A Cyanosis: No Ecchymosis: No Erythema: No Hemosiderin Staining: No [name], [name] ([id number]) Mottled: No Pallor: No Rubor:  No Temperature: No Abnormality N/A N/A Tenderness on Yes N/A N/A Palpation: Wound Preparation: Ulcer Cleansing: N/A N/A Rinsed/Irrigated with Saline Topical Anesthetic Applied: Other: Lidocaine 4% Procedures Performed: Debridement N/A N/A Treatment Notes Wound #4 (Back) 1. Cleansed with: Clean wound with Normal Saline 2. Anesthetic Topical Lidocaine 4% cream to wound bed prior to debridement 4. Dressing Applied: Aquacel Ag 5. Secondary Dressing Applied Bordered Foam Dressing Electronic Signature(s) Signed: 04/25/2017 4:47:51 PM By: Linton Ham MD Entered By: Linton Ham on 04/25/2017 14:05:12 [name] ([id number]) -------------------------------------------------------------------------------- Multi-Disciplinary Care Plan Details Patient Name: [name] Date of Service: 04/25/2017 2:00 PM Medical Record Patient Account Number: [id number] Number: Treating RN: Cornell Barman July 19, 1956 (60 y.o. Other Clinician: Date of Birth/Sex: Female) Treating ROBSON, MICHAEL Primary Care Jasper Ruminski: [name] Lenoria Narine/Extender: G Referring Maggie Senseney: Rodney Langton in Treatment: 2 Active Inactive ` Malignancy/Atypical Etiology Nursing Diagnoses: Knowledge deficit related to disease process and management of atypical ulcer etiology Knowledge deficit related to disease process and management of malignancy Goals: Patient/caregiver will verbalize understanding of disease process and disease management of atypical ulcer etiology Date Initiated: 04/11/2017 Target Resolution Date: 10/07/2017 Goal Status: Active Patient/caregiver will verbalize understanding of disease process and disease management of malignancy Date Initiated: 04/11/2017 Target Resolution Date: 10/07/2017 Goal Status: Active Interventions: Assess patient and family medical history for signs and symptoms of malignancy/atypical etiology upon admission Provide education on atypical ulcer  etiologies Provide education on malignant ulcerations Treatment Activities: Lab evaluations : 04/11/2017 Notes: ` Orientation to the Wound Care Program Nursing Diagnoses: Knowledge deficit related to the wound healing center program Goals: Patient/caregiver will verbalize understanding of the [facility] [name], [name] ([id number]) Date Initiated: 04/11/2017 Target Resolution Date: 10/07/2017 Goal Status: Active Interventions: Provide education on orientation to the wound center Notes: ` Wound/Skin Impairment Nursing Diagnoses: Impaired tissue integrity Knowledge deficit related to ulceration/compromised skin integrity Goals: Patient/caregiver will verbalize understanding of skin care regimen Date Initiated: 04/11/2017 Target Resolution Date: 10/07/2017 Goal Status: Active Ulcer/skin breakdown will have a volume reduction of 30% by week 4 Date Initiated: 04/11/2017 Target Resolution Date: 10/07/2017 Goal Status: Active Ulcer/skin breakdown will have a volume reduction of 50% by week 8 Date Initiated: 04/11/2017 Target Resolution Date: 10/07/2017 Goal Status: Active Ulcer/skin breakdown will have a volume reduction of 80% by week 12 Date Initiated: 04/11/2017 Target Resolution Date: 10/07/2017 Goal Status: Active Ulcer/skin breakdown will heal within 14 weeks Date Initiated: 04/11/2017 Target Resolution Date: 10/07/2017 Goal Status: Active Interventions: Assess patient/caregiver ability to obtain necessary supplies Assess  patient/caregiver ability to perform ulcer/skin care regimen upon admission and as needed Assess ulceration(s) every visit Provide education on ulcer and skin care Treatment Activities: Referred to DME [name] for dressing supplies : 04/11/2017 Skin care regimen initiated : 04/11/2017 Topical wound management initiated : 04/11/2017 Notes: ANJELIKA, AUSBURN ([id number]) Electronic Signature(s) Signed: 04/26/2017 8:26:41 AM By: Gretta Cool, BSN, RN, CWS, Kim RN,  BSN Entered By: Gretta Cool, BSN, RN, CWS, Kim on 04/25/2017 13:54:28 [name] ([id number]) -------------------------------------------------------------------------------- Pain Assessment Details Patient Name: [name] Date of Service: 04/25/2017 2:00 PM Medical Record Patient Account Number: [id number] Number: Treating RN: Cornell Barman 08/06/1956 (60 y.o. Other Clinician: Date of Birth/Sex: Female) Treating ROBSON, MICHAEL Primary Care Tanecia Mccay: [name] Eloyse Causey/Extender: G Referring Marnesha Gagen: Rodney Langton in Treatment: 2 Active Problems Location of Pain Severity and Description of Pain Patient Has Paino No Site Locations With Dressing Change: No Rate the pain. Worst Pain Level: 5 Pain Management and Medication Current Pain Management: Goals for Pain Management Topical or injectable lidocaine is offered to patient for acute pain when surgical debridement is performed. If needed, Patient is instructed to use over the counter pain medication for the following 24-48 hours after debridement. Wound care MDs do not prescribed pain medications. Patient has chronic pain or uncontrolled pain. Patient has been instructed to make an appointment with their Primary Care Physician for pain management. Electronic Signature(s) Signed: 04/26/2017 8:26:41 AM By: Gretta Cool, BSN, RN, CWS, Kim RN, BSN Entered By: Gretta Cool, BSN, RN, CWS, Kim on 04/25/2017 13:44:06 [name] ([id number]) -------------------------------------------------------------------------------- Patient/Caregiver Education Details Patient Name: [name] Date of Service: 04/25/2017 2:00 PM Medical Record Patient Account Number: [id number] Number: Treating RN: Cornell Barman 02/03/1956 (60 y.o. Other Clinician: Date of Birth/Gender: Female) Treating ROBSON, Ina Primary Care Physician: [name] Physician/Extender: G Referring Physician: Rodney Langton in Treatment:  2 Education Assessment Education Provided To: Patient Education Topics Provided Wound/Skin Impairment: Handouts: Caring for Your Ulcer Methods: Demonstration Responses: State content correctly Electronic Signature(s) Signed: 04/26/2017 8:26:41 AM By: Gretta Cool, BSN, RN, CWS, Kim RN, BSN Entered By: Gretta Cool, BSN, RN, CWS, Kim on 04/25/2017 14:01:58 [name] ([id number]) -------------------------------------------------------------------------------- Wound Assessment Details Patient Name: [name] Date of Service: 04/25/2017 2:00 PM Medical Record Patient Account Number: [id number] Number: Treating RN: Cornell Barman Apr 27, 1956 (60 y.o. Other Clinician: Date of Birth/Sex: Female) Treating ROBSON, MICHAEL Primary Care Austyn Perriello: [name] Maeci Kalbfleisch/Extender: G Referring Moncerrat Burnstein: [name] Weeks in Treatment: 2 Wound Status Wound Number: 4 Primary Malignant Wound Etiology: Wound Location: Back Wound Open Wounding Event: Gradually Appeared Status: Date Acquired: 08/17/2016 Comorbid Lymphedema, Deep Vein Thrombosis, Weeks Of Treatment: 2 History: Hypertension, Received Chemotherapy, Clustered Wound: No Received Radiation, Confinement Anxiety Photos Wound Measurements Length: (cm) 2 Width: (cm) 1.5 Depth: (cm) 0.3 Area: (cm) 2.356 Volume: (cm) 0.707 % Reduction in Area: 85.4% % Reduction in Volume: 85.4% Epithelialization: None Tunneling: No Undermining: No Wound Description Full Thickness Without Exposed Foul Odor After C Classification: Support Structures Slough/Fibrino Wound Margin: Indistinct, nonvisible Exudate Medium Amount: Exudate Type: Serosanguineous Exudate Color: red, brown leansing: No Yes Wound Bed Granulation Amount: Small (1-33%) Exposed Structure Granulation Quality: Pale Fascia Exposed: No Indelicato, Gicela ([id number]) Necrotic Amount: Large (67-100%) Fat Layer (Subcutaneous Tissue) Exposed: Yes Necrotic Quality:  Adherent Slough Tendon Exposed: No Muscle Exposed: No Joint Exposed: No Bone Exposed: No Periwound Skin Texture Texture Color No Abnormalities Noted: No No Abnormalities Noted: No Callus: No Atrophie Blanche: No Crepitus: No Cyanosis: No Excoriation: No Ecchymosis: No Induration: No Erythema: No Rash:  No Hemosiderin Staining: No Scarring: No Mottled: No Pallor: No Moisture Rubor: No No Abnormalities Noted: No Dry / Scaly: No Temperature / Pain Maceration: No Temperature: No Abnormality Tenderness on Palpation: Yes Wound Preparation Ulcer Cleansing: Rinsed/Irrigated with Saline Topical Anesthetic Applied: Other: Lidocaine 4%, Treatment Notes Wound #4 (Back) 1. Cleansed with: Clean wound with Normal Saline 2. Anesthetic Topical Lidocaine 4% cream to wound bed prior to debridement 4. Dressing Applied: Aquacel Ag 5. Secondary Dressing Applied Bordered Foam Dressing Electronic Signature(s) Signed: 04/26/2017 8:26:41 AM By: Gretta Cool, BSN, RN, CWS, Kim RN, BSN Entered By: Gretta Cool, BSN, RN, CWS, Kim on 04/25/2017 13:49:29 [name] ([id number]) -------------------------------------------------------------------------------- Vitals Details Patient Name: [name] Date of Service: 04/25/2017 2:00 PM Medical Record Patient Account Number: [id number] Number: Treating RN: Cornell Barman 1956-09-29 (60 y.o. Other Clinician: Date of Birth/Sex: Female) Treating ROBSON, MICHAEL Primary Care [name] Hinesley: [name] Arienne Gartin/Extender: G Referring Coleman Kalas: [name] Weeks in Treatment: 2 Vital Signs Time Taken: 13:44 Temperature (F): 98.0 Height (in): 66 Pulse (bpm): 73 Respiratory Rate (breaths/min): 16 Blood Pressure (mmHg): 114/88 Reference Range: 80 - 120 mg / dl Electronic Signature(s) Signed: 04/26/2017 8:26:41 AM By: Gretta Cool, BSN, RN, CWS, Kim RN, BSN Entered By: Gretta Cool, BSN, RN, CWS, Kim on 04/25/2017 13:44:24

## 2017-04-27 NOTE — Progress Notes (Signed)
[place]  [phone] [phone]  OFFICE PROGRESS NOTE   ID: [name]   DOB: January 12, 1956  MR#: [id number]  NFA#:[id number]   PCP: [name] Hey, MD SU: [name]. [name], [name], [name] RAD ION:[id number] [name], [name] OTHER:  [name] Bickers, MD, [name] Fudge MD  CHIEF COMPLAINT:  Stage IV Breast Cancer, angiosarcoma  CURRENT TREATMENT: Abraxane, trastuzumab, pertuzumab  BREAST CANCER HISTORY: From the original intake note:  The patient originally presented in May 2003 when she noticed a lump in the upper inner quadrant of her right breast in September 2003.  She sought attention and had a mammogram which showed an obvious carcinoma in the upper inner quadrant of the right breast, approximately 2 cm.  There were some enlarged lymph nodes in the axilla and an FNA done showed those consistent with malignant cells, most likely an invasive ductal carcinoma.  At that point she was unsure of what to do and was referred by [name] for a discussion of her treatment options.  By biopsy, it was ER/PR negative and HER-2 was 3+.  DNA index was 1.42.  We reiterated that it was most important to have her disease surgically addressed at that point and had recommended lumpectomy and axillary nodes to be addressed with which she followed through and on 04-03-02, [name] performed a right partial mastectomy and a right axillary lymph node dissection.  Final pathology revealed a 2.4 cm. high grade, Grade III invasive ductal carcinoma with an adjacent .8 cm. also high grade invasive ductal carcinoma which was felt to represent an intramammary metastasis rather than a second primary.  A smaller mass was just medial to the larger mass.  The smaller mass was associated with high grade DCIS component.  There was no definite lymphovascular invasion identified.  However, one of fourteen axillary lymph nodes did contain a 1.5 cm. metastatic  deposit.  Postoperatively she did very well.  We reiterated the fact that she did need adjuvant chemotherapy, however, she refused and decided that she would pursue radiation.  She received radiation and completed that on 08-14-02. Her subsequent history is as detailed below.   INTERVAL HISTORY: [name] returns today for follow-up and treatment of her HER-2/neu positive metastatic breast cancer, accompanied by her oldest daughter. [name] continues on anti-HER-2 immunotherapy and Abraxane. She tolerates this well. She has had no problems with peripheral neuropathy. She has no significant nausea issues. She does not become particularly fatigued or tired with the treatments. There have been no axis issues: Her port is working well  REVIEW OF SYSTEMS: [name] tells me she is doing "pretty good". She is "getting out more". She is going to participate in a program at the [facility], she takes walks every morning, and she goes out with family and friends. She says her pain is well-controlled. Sometimes when she goes to the wound clinic and they do some scraping it can be painful and this [name] or down a day or 2. Despite the narcotics she is having regular bowel movements. She denies any fever or bleeding problems. She has gained some weight. A detailed review of systems today was otherwise stable   PAST MEDICAL HISTORY: Past Medical History:  Diagnosis Date  . Breast cancer ([place])   . DVT (deep venous thrombosis) ([place]) 10/07/2011  . Hypertension   . Radiation 11/29/2005-12/29/2005   right supraclavicular area 4147 cGy  . Radiation 06/26/02-08/14/02   right breast 5040 cGy, tumor bed boosted to 1260  cGy  . Rheumatic fever     PAST SURGICAL HISTORY: Past Surgical History:  Procedure Laterality Date  . BREAST SURGERY    . MASTECTOMY     bilateral  . PORT A CATH REVISION      FAMILY HISTORY Family History  Problem Relation Age of Onset  . Pancreatic cancer Mother   . Kidney cancer Sister 61   . Breast cancer Sister 24  . Breast cancer Other 16  The patient's father died in his 76L  from complications of alcohol abuse. The patient's mother died in her 8s from pancreatic cancer. The patient has 6 brothers, 2 sisters. Both her sisters have had breast cancer, both diagnosed after the age of 95. The patient has not had genetic testing so far.   GYNECOLOGIC HISTORY: Menarche age 17; first live birth age 79. She is GXP4. She is not sure when she stopped having periods. She never used hormone replacement.  SOCIAL HISTORY: The patient has worked in the past as a Psychologist, counselling. At home in addition to the patient are her husband [name], originally from [place], who works as a Administrator.  Also living in the home is her daughter [name] (she is studying to be a Marine scientist).  The other 2 children are [name], who has a college degree in psychology and works as a Probation officer; and [name], who lives in [place] and works as a Higher education careers adviser.Son [name] also sometimes stays in the home. In addition the patient has an aid who helps her almost daily.   ADVANCED DIRECTIVES: Not in place  HEALTH MAINTENANCE: Social History  Substance Use Topics  . Smoking status: Light Tobacco Smoker  . Smokeless tobacco: Never Used  . Alcohol use Yes     Comment: Rarely     Colonoscopy:  Not on file  PAP:  Not on file  Bone density:  Not on file  Lipid panel:  Not on file    Allergies  Allergen Reactions  . Pork-Derived Products Other (See Comments)    Pt says she just doesn't eat pork; has no reaction to pork products  . Tramadol Nausea Only  . Hydrocodone-Acetaminophen Itching and Rash    Current Outpatient Prescriptions  Medication Sig Dispense Refill  . alprazolam (XANAX) 2 MG tablet Take 2 mg by mouth daily.     . Ascorbic Acid (VITAMIN C) 100 MG tablet Take 100 mg by mouth daily.      . calcium-vitamin D (OSCAL WITH D) 500-200 MG-UNIT per tablet Take 1 tablet by mouth daily.      Marland Kitchen gabapentin  (NEURONTIN) 300 MG capsule Take 1 capsule by mouth daily.    . methadone (DOLOPHINE) 5 MG tablet Take 1 tablet (5 mg total) by mouth every 8 (eight) hours. 90 tablet 0  . Multiple Vitamin (MULTIVITAMIN) capsule Take 1 capsule by mouth daily.      Marland Kitchen oxyCODONE-acetaminophen (PERCOCET) 10-325 MG tablet Take 2 tablets by mouth every 4 (four) hours as needed.  0  . oxymorphone (OPANA) 10 MG tablet Take 1 tablet by mouth 2 (two) times daily.  0  . prochlorperazine (COMPAZINE) 10 MG tablet Take 1 tablet by mouth every 6 (six) hours as needed.    . sertraline (ZOLOFT) 50 MG tablet Take 1 tablet (50 mg total) by mouth daily. 30 tablet 3  . triamterene-hydrochlorothiazide (DYAZIDE) 37.5-25 MG capsule Take 1 each (1 capsule total) by mouth every morning. 30 capsule 3   No current facility-administered medications for this visit.  Facility-Administered Medications Ordered in Other Visits  Medication Dose Route Frequency Provider Last Rate Last Dose  . sodium chloride 0.9 % injection 10 mL  10 mL Intravenous PRN [name], [name], MD   10 mL at 03/04/14 1421    OBJECTIVE: Middle-aged African-American woman In no acute distress  Vitals:   04/27/17 1050  BP: 114/66  Pulse: 75  Resp: 18  Temp: 98.6 F (37 C)     Body mass index is 27.97 kg/m.    ECOG FS: 1 Filed Weights   04/27/17 1050  Weight: 173 lb 4.8 oz (78.6 kg)    Sclerae unicteric, EOMs intact Oropharynx clear and moist No cervical or supraclavicular adenopathy Lungs no rales or rhonchi Heart regular rate and rhythm Abd soft, nontender, positive bowel sounds MSK the back lesions are stable to improved as compared to the photo below Neuro: nonfocal, well oriented, appropriate affect Breasts: Status post bilateral mastectomies. No evidence of chest wall recurrence.   Photo 04/06/2017          LAB RESULTS:.  Lab Results  Component Value Date   WBC 6.4 04/27/2017   NEUTROABS 3.4 04/27/2017   HGB 11.3 (L) 04/27/2017    HCT 36.3 04/27/2017   MCV 90.1 04/27/2017   PLT 191 04/27/2017      Chemistry      Component Value Date/Time   NA 142 04/06/2017 1049   K 4.0 04/06/2017 1049   CL 105 01/24/2017 0657   CL 101 03/07/2013 1411   CO2 31 (H) 04/06/2017 1049   BUN 13.5 04/06/2017 1049   CREATININE 0.9 04/06/2017 1049      Component Value Date/Time   CALCIUM 9.6 04/06/2017 1049   ALKPHOS 114 04/06/2017 1049   AST 24 04/06/2017 1049   ALT 12 04/06/2017 1049   BILITOT 0.24 04/06/2017 1049       STUDIES: Most recent scans was a 02/09/2017 CT and your which showed no pulmonary embolism, small left effusion, and radiation changes in the right lung.  ASSESSMENT:  61 y.o. [place] woman with stage IV breast cancer manifested chiefly by loco-regional nodal disease (neck, chest), without lung, liver, bone, or brain involvement.  (1) Status post right upper inner quadrant lumpectomy and sentinel lymph node sampling 03/04/2002 for 2 separate foci of invasive ductal carcinoma, mpT2 pN1 or stage IIB, both foci grade 3, both estrogen and progesterone receptor positive, both HercepTest 3+, Mib-1 56%  (2) Reexcision for margins 05/27/2002 showed no residual cancer in the breast.  (3) The patient refused adjuvant systemic therapy.  (4) Adjuvant radiation treatment completed 08/14/2002.  (5) recurrence in the right breast in 02/2004 showing a morphologically different tumor, again grade 3, again estrogen and progesterone receptor negative, with an MIB-1 of 14% and Herceptest 3+.  (5) Between 03/2004 and 07/2004 she received dose dense Doxorubicin/Cyclophosphamide x 4 given with trastuzumab, followed by weekly Docetaxel x 8, again given with trastuzumab.  (6) Right mastectomy 07/13/2004 showed scattered microscopic foci of residual disease over an area  greater than 5 cm. Margins were negative.  (7) Postoperative Docetaxel continued until 09/2004.  (8) Trastuzumab (Herceptin) given 08/2004 through 01/2012  with some brief interruptions.  (9) Isolated right cervical nodal recurrence 10/2005, treated with radiation to the right supraclavicular area (total 41.5 gray) completed 12/29/2005.  (10) Navelbine given together with Herceptin  11/2005 through 03/2006.  (9) Left mastectomy 02/13/2006 for ductal carcinoma in situ, grade 2, estrogen and progesterone receptor negative, with negative margins; 0 of 3 lymph  nodes involved  (10) treated with Lapatinib and Capecitabine before 10/2009, for an unclear duration and with unclear results (cannot locate data on chart review).  (11) Status post right supraclavicular lymph node biopsy 09/2010 again positive for an invasive ductal carcinoma, estrogen and progesterone receptor negative, HER-2 positive by CISH with a ratio 4.25.  (12) Navelbine given together with Herceptin between 05/2011 and 11/2011.  (13) Carboplatin/ Gemcitabine/ Herceptin given for 2 cycles, in 12/2011 and 01/2012.  (14) TDM-1 ([place]) started 02/2012. Last dose 10/02/2013 after which the patient discontinued treatment at her own discretion. Echo on December 2014 showed a well preserved ejection fraction.  (15) Deep vein thrombosis of the right upper extremity documented 04/20/2011.  She completed anticoagulant therapy with Coumadin on 03/25/2013.  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 Left chest Port-A-Cath placed on 04/08/2013; being flushed every 6 weeks  RIGHT UPPER EXTREMITY ANGIOSARCOMA VS BREAST CANCER: August 2016 (18) treated at [facility] of [place] August 2016 with paclitaxel, trastuzumab and pertuzumab 1 cycle, afterwards referred to hospice  (19) under the care of hospice of [facility] August 2016 to 05/08/2016, when the  patient opted for a second try at chemotherapy  (20) started low-dose Abraxane, trastuzumab and pertuzumab 06/07/2016, to be repeated every 3 weeks.   (a) pretreatment echocardiogram 06/06/2016 showed a 60-65% ejection fraction  (b) significant compliance problems compromise response to treatment  (c) last echocardiogram 02/20/17 LVEF 55-60%  (21) Chronic pain secondary to known metastatic disease  (a) patient signed pain contract 10/19/2016  (b) as of 11/16/2016 receives her pain medications from Dr [name]   PLAN: Evonda continues to tolerate treatment remarkably well and at the very least it seems to be keeping her cancer under control.  Accordingly we are continuing as before. She will receive Abraxane trastuzumab and Pertuzumab today and every 21 days until we see evidence of disease progression or she is unable to tolerate the treatment.  We are going to be doing echocardiograms every 6 months. Her next one will be done in October.  Her pain medications are managed to her primary care physician's office. That seems to be working well for her at this point.  She will see me again in 3 weeks. She knows to call for any problems that may develop before that visit.   Chauncey Cruel, MD 04/27/2017 11:03 AM

## 2017-05-09 ENCOUNTER — Encounter: Payer: Commercial Managed Care - PPO | Attending: Internal Medicine | Admitting: Internal Medicine

## 2017-05-09 ENCOUNTER — Other Ambulatory Visit: Payer: Self-pay | Admitting: *Deleted

## 2017-05-09 DIAGNOSIS — Z9013 Acquired absence of bilateral breasts and nipples: Secondary | ICD-10-CM | POA: Diagnosis not present

## 2017-05-09 DIAGNOSIS — Z923 Personal history of irradiation: Secondary | ICD-10-CM | POA: Insufficient documentation

## 2017-05-09 DIAGNOSIS — I890 Lymphedema, not elsewhere classified: Secondary | ICD-10-CM | POA: Insufficient documentation

## 2017-05-09 DIAGNOSIS — L98422 Non-pressure chronic ulcer of back with fat layer exposed: Secondary | ICD-10-CM | POA: Diagnosis not present

## 2017-05-09 DIAGNOSIS — F419 Anxiety disorder, unspecified: Secondary | ICD-10-CM | POA: Insufficient documentation

## 2017-05-09 DIAGNOSIS — Z853 Personal history of malignant neoplasm of breast: Secondary | ICD-10-CM | POA: Diagnosis not present

## 2017-05-09 DIAGNOSIS — C4911 Malignant neoplasm of connective and soft tissue of right upper limb, including shoulder: Secondary | ICD-10-CM | POA: Diagnosis not present

## 2017-05-09 DIAGNOSIS — I10 Essential (primary) hypertension: Secondary | ICD-10-CM | POA: Insufficient documentation

## 2017-05-09 DIAGNOSIS — F17200 Nicotine dependence, unspecified, uncomplicated: Secondary | ICD-10-CM | POA: Insufficient documentation

## 2017-05-09 DIAGNOSIS — Z9221 Personal history of antineoplastic chemotherapy: Secondary | ICD-10-CM | POA: Diagnosis not present

## 2017-05-09 DIAGNOSIS — Z86718 Personal history of other venous thrombosis and embolism: Secondary | ICD-10-CM | POA: Insufficient documentation

## 2017-05-09 MED ORDER — TRIAMTERENE-HCTZ 37.5-25 MG PO CAPS: 1.0000 | ORAL_CAPSULE | Freq: Every morning | ORAL | 3 refills | Status: DC

## 2017-05-11 NOTE — Progress Notes (Addendum)
AUDIANNA, LANDGREN ([id number]) Visit Report for 05/09/2017 Arrival Information Details Patient Name: [name], [name] Date of Service: 05/09/2017 3:00 PM Medical Record Patient Account Number: [id number] Number: Treating RN: Baruch Gouty, RN, BSN, Rita 03/13/1956 [phone] y.o. Other Clinician: Date of Birth/Sex: Female) Treating ROBSON, MICHAEL Primary Care Caison Hearn: [name] Adasyn Mcadams/Extender: G Referring Joenathan Sakuma: Rodney Langton in Treatment: 4 Visit Information History Since Last Visit All ordered tests and consults were completed: No Patient Arrived: Ambulatory Added or deleted any medications: No Arrival Time: 14:58 Any new allergies or adverse reactions: No Accompanied By: family Had a fall or experienced change in No Transfer Assistance: None activities of daily living that may affect Patient Identification Verified: Yes risk of falls: Secondary Verification Process Yes Signs or symptoms of abuse/neglect since last No Completed: visito Patient Requires Transmission-Based No Hospitalized since last visit: No Precautions: Has Dressing in Place as Prescribed: Yes Patient Has Alerts: No Pain Present Now: Yes Electronic Signature(s) Signed: 05/09/2017 2:59:16 PM By: Regan Lemming BSN, RN Entered By: Regan Lemming on 05/09/2017 14:59:15 [name] ([id number]) -------------------------------------------------------------------------------- Encounter Discharge Information Details Patient Name: [name] Date of Service: 05/09/2017 3:00 PM Medical Record Patient Account Number: [id number] Number: Treating RN: Baruch Gouty, RN, BSN, Rita [birth year] [phone] y.o. Other Clinician: Date of Birth/Sex: Female) Treating ROBSON, MICHAEL Primary Care Rehan Holness: [name] Shanina Kepple/Extender: G Referring Baxter Gonzalez: Rodney Langton in Treatment: 4 Encounter Discharge Information Items Discharge Pain Level: 0 Discharge Condition: Stable Ambulatory Status:  Ambulatory Discharge Destination: Home Transportation: Private Auto Accompanied By: self Schedule Follow-up Appointment: No Medication Reconciliation completed and provided to Patient/Care No Jailin Manocchio: Provided on Clinical Summary of Care: 05/09/2017 Form Type Recipient Paper Patient MF Electronic Signature(s) Signed: 05/09/2017 4:25:10 PM By: Regan Lemming BSN, RN Previous Signature: 05/09/2017 3:36:58 PM Version By: Ruthine Dose Entered By: Regan Lemming on 05/09/2017 15:37:21 [name] ([id number]) -------------------------------------------------------------------------------- Lower Extremity Assessment Details Patient Name: [name] Date of Service: 05/09/2017 3:00 PM Medical Record Patient Account Number: [id number] Number: Treating RN: Baruch Gouty, RN, BSN, Rita 05/11/1956 [phone] y.o. Other Clinician: Date of Birth/Sex: Female) Treating ROBSON, MICHAEL Primary Care Alaric Gladwin: [name] Romone Shaff/Extender: G Referring Saisha Hogue: [name] Weeks in Treatment: 4 Electronic Signature(s) Signed: 05/09/2017 2:58:48 PM By: Regan Lemming BSN, RN Entered By: Regan Lemming on 05/09/2017 14:58:48 [name] ([id number]) -------------------------------------------------------------------------------- Multi Wound Chart Details Patient Name: [name] Date of Service: 05/09/2017 3:00 PM Medical Record Patient Account Number: [id number] Number: Treating RN: Baruch Gouty, RN, BSN, Rita [birth year] [phone] y.o. Other Clinician: Date of Birth/Sex: Female) Treating ROBSON, MICHAEL Primary Care Kiasha Bellin: [name] Joury Allcorn/Extender: G Referring Camika Marsico: [name] Weeks in Treatment: 4 Vital Signs Height(in): 66 Pulse(bpm): 70 Weight(lbs): Blood Pressure 115/68 (mmHg): Body Mass Index(BMI): Temperature(F): 98.0 Respiratory Rate 16 (breaths/min): Photos: [4:No Photos] [N/A:N/A] Wound Location: [4:Back] [N/A:N/A] Wounding Event: [4:Gradually  Appeared] [N/A:N/A] Primary Etiology: [4:Malignant Wound] [N/A:N/A] Comorbid History: [4:Lymphedema, Deep Vein N/A Thrombosis, Hypertension, Received Chemotherapy, Received Radiation, Confinement Anxiety] Date Acquired: [4:08/17/2016] [N/A:N/A] Weeks of Treatment: [4:4] [N/A:N/A] Wound Status: [4:Open] [N/A:N/A] Measurements L x W x D 1x1x0.2 [N/A:N/A] (cm) Area (cm) : [4:0.785] [N/A:N/A] Volume (cm) : [4:0.157] [N/A:N/A] % Reduction in Area: [4:95.10%] [N/A:N/A] % Reduction in Volume: 96.70% [N/A:N/A] Classification: [4:Full Thickness Without N/A Exposed Support Structures] Exudate Amount: [4:Small] [N/A:N/A] Exudate Type: [4:Serosanguineous] [N/A:N/A] Exudate Color: [4:red, brown] [N/A:N/A] Wound Margin: [4:Indistinct, nonvisible N/A] Granulation Amount: [4:Small (1-33%)] [N/A:N/A] Granulation Quality: [4:Pale] [N/A:N/A] Necrotic Amount: Large (67-100%) N/A N/A Necrotic Tissue: Eschar N/A N/A Exposed Structures: Fat Layer (Subcutaneous N/A  N/A Tissue) Exposed: Yes Fascia: No Tendon: No Muscle: No Joint: No Bone: No Epithelialization: Large (67-100%) N/A N/A Debridement: Debridement ([id number]- N/A N/A 11047) Pre-procedure 15:29 N/A N/A Verification/Time Out Taken: Pain Control: Lidocaine 4% Topical N/A N/A Solution Tissue Debrided: Fibrin/Slough, Fat, N/A N/A Subcutaneous Level: Skin/Subcutaneous N/A N/A Tissue Debridement Area (sq 1 N/A N/A cm): Instrument: Curette N/A N/A Bleeding: Minimum N/A N/A Hemostasis Achieved: Pressure N/A N/A Procedural Pain: 0 N/A N/A Post Procedural Pain: 0 N/A N/A Debridement Treatment Procedure was tolerated N/A N/A Response: well Post Debridement 1x1x0.2 N/A N/A Measurements L x W x D (cm) Post Debridement 0.157 N/A N/A Volume: (cm) Periwound Skin Texture: Excoriation: No N/A N/A Induration: No Callus: No Crepitus: No Rash: No Scarring: No Periwound Skin Dry/Scaly: Yes N/A N/A Moisture: Maceration: No Periwound Skin  Color: Atrophie Blanche: No N/A N/A Cyanosis: No Ecchymosis: No Erythema: No Hemosiderin Staining: No Mottled: No Ferch, Jere ([id number]) Pallor: No Rubor: No Temperature: No Abnormality N/A N/A Tenderness on Yes N/A N/A Palpation: Wound Preparation: Ulcer Cleansing: N/A N/A Rinsed/Irrigated with Saline Topical Anesthetic Applied: Other: Lidocaine 4% Procedures Performed: Debridement N/A N/A Treatment Notes Wound #4 (Back) 1. Cleansed with: Clean wound with Normal Saline 4. Dressing Applied: Aquacel Ag 5. Secondary Dressing Applied Bordered Foam Dressing Dry Gauze Electronic Signature(s) Signed: 05/09/2017 5:35:48 PM By: Linton Ham MD Previous Signature: 05/09/2017 4:25:10 PM Version By: Regan Lemming BSN, RN Entered By: Linton Ham on 05/09/2017 16:57:55 [name] ([id number]) -------------------------------------------------------------------------------- Multi-Disciplinary Care Plan Details Patient Name: [name] Date of Service: 05/09/2017 3:00 PM Medical Record Patient Account Number: [id number] Number: Treating RN: Baruch Gouty, RN, BSN, Rita 03-02-1956 [phone] y.o. Other Clinician: Date of Birth/Sex: Female) Treating ROBSON, MICHAEL Primary Care Gennette Shadix: [name] Angeles Zehner/Extender: G Referring Cledis Sohn: Rodney Langton in Treatment: 4 Active Inactive ` Malignancy/Atypical Etiology Nursing Diagnoses: Knowledge deficit related to disease process and management of atypical ulcer etiology Knowledge deficit related to disease process and management of malignancy Goals: Patient/caregiver will verbalize understanding of disease process and disease management of atypical ulcer etiology Date Initiated: 04/11/2017 Target Resolution Date: 10/07/2017 Goal Status: Active Patient/caregiver will verbalize understanding of disease process and disease management of malignancy Date Initiated: 04/11/2017 Target Resolution Date: 10/07/2017 Goal  Status: Active Interventions: Assess patient and family medical history for signs and symptoms of malignancy/atypical etiology upon admission Provide education on atypical ulcer etiologies Provide education on malignant ulcerations Treatment Activities: Lab evaluations : 04/11/2017 Notes: ` Orientation to the Wound Care Program Nursing Diagnoses: Knowledge deficit related to the wound healing center program Goals: Patient/caregiver will verbalize understanding of the [facility] TATYANA, BIBER ([id number]) Date Initiated: 04/11/2017 Target Resolution Date: 10/07/2017 Goal Status: Active Interventions: Provide education on orientation to the wound center Notes: ` Wound/Skin Impairment Nursing Diagnoses: Impaired tissue integrity Knowledge deficit related to ulceration/compromised skin integrity Goals: Patient/caregiver will verbalize understanding of skin care regimen Date Initiated: 04/11/2017 Target Resolution Date: 10/07/2017 Goal Status: Active Ulcer/skin breakdown will have a volume reduction of 30% by week 4 Date Initiated: 04/11/2017 Target Resolution Date: 10/07/2017 Goal Status: Active Ulcer/skin breakdown will have a volume reduction of 50% by week 8 Date Initiated: 04/11/2017 Target Resolution Date: 10/07/2017 Goal Status: Active Ulcer/skin breakdown will have a volume reduction of 80% by week 12 Date Initiated: 04/11/2017 Target Resolution Date: 10/07/2017 Goal Status: Active Ulcer/skin breakdown will heal within 14 weeks Date Initiated: 04/11/2017 Target Resolution Date: 10/07/2017 Goal Status: Active Interventions: Assess patient/caregiver ability to obtain necessary supplies Assess patient/caregiver ability  to perform ulcer/skin care regimen upon admission and as needed Assess ulceration(s) every visit Provide education on ulcer and skin care Treatment Activities: Referred to DME Abbegail Matuska for dressing supplies : 04/11/2017 Skin care regimen initiated :  04/11/2017 Topical wound management initiated : 04/11/2017 Notes: ANTORIA, LANZA ([id number]) Electronic Signature(s) Signed: 05/09/2017 4:25:10 PM By: Regan Lemming BSN, RN Entered By: Regan Lemming on 05/09/2017 15:11:20 [name] ([id number]) -------------------------------------------------------------------------------- Pain Assessment Details Patient Name: [name] Date of Service: 05/09/2017 3:00 PM Medical Record Patient Account Number: [id number] Number: Treating RN: Baruch Gouty, RN, BSN, Rita 1956-02-24 [phone] y.o. Other Clinician: Date of Birth/Sex: Female) Treating ROBSON, MICHAEL Primary Care Mariaha Ellington: [name] Nana Hoselton/Extender: G Referring Shaiann Mcmanamon: Rodney Langton in Treatment: 4 Active Problems Location of Pain Severity and Description of Pain Patient Has Paino No Site Locations With Dressing Change: No Pain Management and Medication Current Pain Management: Electronic Signature(s) Signed: 05/09/2017 4:25:10 PM By: Regan Lemming BSN, RN Entered By: Regan Lemming on 05/09/2017 15:06:42 [name] ([id number]) -------------------------------------------------------------------------------- Patient/Caregiver Education Details Patient Name: [name] Date of Service: 05/09/2017 3:00 PM Medical Record Patient Account Number: [id number] Number: Treating RN: Baruch Gouty, RN, BSN, Rita 1955/11/14 [phone] y.o. Other Clinician: Date of Birth/Gender: Female) Treating ROBSON, Hart[place] Primary Care Physician: [name] Physician/Extender: G Referring Physician: Rodney Langton in Treatment: 4 Education Assessment Education Provided To: Patient Education Topics Provided Malignant/Atypical Wounds: Methods: Explain/Verbal Responses: State content correctly Welcome To [facility]: Methods: Explain/Verbal Responses: State content correctly Wound/Skin Impairment: Methods: Explain/Verbal Responses: State content  correctly Electronic Signature(s) Signed: 05/09/2017 4:25:10 PM By: Regan Lemming BSN, RN Entered By: Regan Lemming on 05/09/2017 15:37:39 [name] ([id number]) -------------------------------------------------------------------------------- Wound Assessment Details Patient Name: [name] Date of Service: 05/09/2017 3:00 PM Medical Record Patient Account Number: [id number] Number: Treating RN: Baruch Gouty, RN, BSN, Rita Jun 05, 1956 [phone] y.o. Other Clinician: Date of Birth/Sex: Female) Treating ROBSON, MICHAEL Primary Care Ethan Kasperski: [name] Mackensi Mahadeo/Extender: G Referring Leyli Kevorkian: [name] Weeks in Treatment: 4 Wound Status Wound Number: 4 Primary Malignant Wound Etiology: Wound Location: Back Wound Open Wounding Event: Gradually Appeared Status: Date Acquired: 08/17/2016 Comorbid Lymphedema, Deep Vein Thrombosis, Weeks Of Treatment: 4 History: Hypertension, Received Chemotherapy, Clustered Wound: No Received Radiation, Confinement Anxiety Wound Measurements Length: (cm) 1 Width: (cm) 1 Depth: (cm) 0.2 Area: (cm) 0.785 Volume: (cm) 0.157 % Reduction in Area: 95.1% % Reduction in Volume: 96.7% Epithelialization: Large (67-100%) Tunneling: No Undermining: No Wound Description Full Thickness Without Exposed Foul Odor After Classification: Support Structures Slough/Fibrino Wound Margin: Indistinct, nonvisible Exudate Small Amount: Exudate Type: Serosanguineous Exudate Color: red, brown Cleansing: No Yes Wound Bed Granulation Amount: Small (1-33%) Exposed Structure Granulation Quality: Pale Fascia Exposed: No Necrotic Amount: Large (67-100%) Fat Layer (Subcutaneous Tissue) Exposed: Yes Necrotic Quality: Eschar Tendon Exposed: No Muscle Exposed: No Joint Exposed: No Bone Exposed: No Periwound Skin Texture Texture Color No Abnormalities Noted: No No Abnormalities Noted: No Behring, Tanisa ([id number]) Callus: No Atrophie Blanche:  No Crepitus: No Cyanosis: No Excoriation: No Ecchymosis: No Induration: No Erythema: No Rash: No Hemosiderin Staining: No Scarring: No Mottled: No Pallor: No Moisture Rubor: No No Abnormalities Noted: No Dry / Scaly: Yes Temperature / Pain Maceration: No Temperature: No Abnormality Tenderness on Palpation: Yes Wound Preparation Ulcer Cleansing: Rinsed/Irrigated with Saline Topical Anesthetic Applied: Other: Lidocaine 4%, Treatment Notes Wound #4 (Back) 1. Cleansed with: Clean wound with Normal Saline 4. Dressing Applied: Aquacel Ag 5. Secondary Dressing Applied Bordered Foam Dressing Dry Gauze Electronic Signature(s) Signed: 05/09/2017 4:25:10  PM By: Regan Lemming BSN, RN Entered By: Regan Lemming on 05/09/2017 15:10:20 [name] ([id number]) -------------------------------------------------------------------------------- Vitals Details Patient Name: [name] Date of Service: 05/09/2017 3:00 PM Medical Record Patient Account Number: [id number] Number: Treating RN: Baruch Gouty, RN, BSN, Rita 12-Mar-1956 [phone] y.o. Other Clinician: Date of Birth/Sex: Female) Treating ROBSON, MICHAEL Primary Care Jessalynn Mccowan: [name] Kirsten Mckone/Extender: G Referring Ivon Roedel: [name] Weeks in Treatment: 4 Vital Signs Time Taken: 15:10 Temperature (F): 98.0 Height (in): 66 Pulse (bpm): 70 Respiratory Rate (breaths/min): 16 Blood Pressure (mmHg): 115/68 Reference Range: 80 - 120 mg / dl Electronic Signature(s) Signed: 05/09/2017 4:25:10 PM By: Regan Lemming BSN, RN Entered By: Regan Lemming on 05/09/2017 15:10:57

## 2017-05-13 NOTE — Progress Notes (Signed)
ETHAL, GOTAY ([id number]) Visit Report for 05/09/2017 Chief Complaint Document Details Patient Name: [name], [name] Date of Service: 05/09/2017 3:00 PM Medical Record Patient Account Number: [id number] Number: Treating RN: Baruch Gouty, RN, BSN, Rita [birth year] [phone] y.o. Other Clinician: Date of Birth/Sex: Female) Treating ROBSON, MICHAEL Primary Care Provider: [name] Provider/Extender: G Referring Provider: Rodney Langton in Treatment: 4 Information Obtained from: Patient Chief Complaint Patient presents to the wound care center for a consult due non healing wound. The patient was known to have bilateral lymphedema as a status post mastectomy syndrome now comes with an ulcerated area of her right upper arm and back which she's had for several weeks. 04/11/17; patient is here for review of wounds on her back Electronic Signature(s) Signed: 05/09/2017 5:35:48 PM By: Linton Ham MD Entered By: Linton Ham on 05/09/2017 16:58:19 [name] ([id number]) -------------------------------------------------------------------------------- Debridement Details Patient Name: [name] Date of Service: 05/09/2017 3:00 PM Medical Record Patient Account Number: [id number] Number: Treating RN: Baruch Gouty, RN, BSN, Rita May 05, 1956 [phone] y.o. Other Clinician: Date of Birth/Sex: Female) Treating ROBSON, MICHAEL Primary Care Provider: [name] Provider/Extender: G Referring Provider: Rodney Langton in Treatment: 4 Debridement Performed for Wound #4 Back Assessment: Performed By: Physician Ricard Dillon, MD Debridement: Debridement Pre-procedure Verification/Time Out Yes - 15:29 Taken: Start Time: 15:29 Pain Control: Lidocaine 4% Topical Solution Level: Skin/Subcutaneous Tissue Total Area Debrided (L x 1 (cm) x 1 (cm) = 1 (cm) W): Tissue and other Non-Viable, Fat, Fibrin/Slough, Subcutaneous material debrided: Instrument:  Curette Bleeding: Minimum Hemostasis Achieved: Pressure End Time: 15:30 Procedural Pain: 0 Post Procedural Pain: 0 Response to Treatment: Procedure was tolerated well Post Debridement Measurements of Total Wound Length: (cm) 1 Width: (cm) 1 Depth: (cm) 0.2 Volume: (cm) 0.157 Character of Wound/Ulcer Post Stable Debridement: Post Procedure Diagnosis Same as Pre-procedure Electronic Signature(s) Signed: 05/09/2017 5:35:48 PM By: Linton Ham MD Signed: 05/12/2017 1:51:46 PM By: Regan Lemming BSN, RN Previous Signature: 05/09/2017 4:25:10 PM Version By: Regan Lemming BSN, RN [place], [place] ([id number]) Entered By: Linton Ham on 05/09/2017 16:58:09 [name] ([id number]) -------------------------------------------------------------------------------- HPI Details Patient Name: [name] Date of Service: 05/09/2017 3:00 PM Medical Record Patient Account Number: [id number] Number: Treating RN: Baruch Gouty, RN, BSN, Rita Dec 17, 1955 [phone] y.o. Other Clinician: Date of Birth/Sex: Female) Treating ROBSON, MICHAEL Primary Care Provider: [name] Provider/Extender: G Referring Provider: [name] Weeks in Treatment: 4 History of Present Illness Location: bilateral arm lymphedema with now skin ulceration of the right upper extremity. Quality: Patient reports experiencing a dull pain to affected area(s). Severity: Patient states wound are getting worse. Duration: Patient has had the wound for > 18 months prior to seeking treatment at the wound center Timing: Pain in wound is Intermittent (comes and goes Context: The wound occurred when the patiento. Modifying Factors: Consults to this date include: local care and antibiotics as prescribed by the PCP and the ER. Associated Signs and Symptoms: Patient reports having increase discharge. HPI Description: 61 year old patient known to [name] from a prior visit about a year ago, returns for care. she is known to have stage  IV breast cancer, angiosarcoma and has been on treatment with the medical oncologist Dr. Jana Hakim. She has been seeing the hospice nurses earlier and has been continuing with chemotherapy. She was known to have stage IV breast cancer, originally diagnosed in April 2003 and now manifested chiefly by loco-regional nodal disease (neck, chest), without lung, liver, bone, or brain involvement. he has received extensive radiation and chemotherapy after the  surgeries. She's always had bilateral lymphedema of her arms but now comes with an ulcerated area on her right upper arm posteriorly. Her medical Oncologist, Dr.Gustav [name], was concerned that what we may be seeing in Ajia's right upper extremity is actually skin spread of her cancer. This needs to be biopsied and she was sending her back to general surgery with that in mind. She placed a referral to the wound clinic . She has been on doxycycline for now. The general surgeon saw her 2 days ago and did a biopsy and this report is pending. In April 2016 she had a right upper arm venous duplex study which showed: No evidence of deep vein or superficial thrombosis involving the ooright upper extremity and left subclavian vein. Moderate edema noted throughout the right upper extremity. 05/01/2015 the patient tells me that her surgeon revealed that her biopsy was negative and that there was no pathology found. Other than that she is doing well and she thinks her edema has gone down a bit. I had done a biopsy for her about a year ago and final report is not back yet but the pathologist Dr. Chrystine Oiler did speak to me on the phone a few days ago and mentioned that he is doing final stains and there may be a mixture of a angiosarcoma and metastatic breast cancer. I have not been able to speak to Dr. Jana Hakim yet but I understand the patient has seen him today and he has discussed the provisional pathology and offered her some chemotherapy. The final  pathology report is back and it shows a metastatic breast carcinoma and an atypical vascular proliferation. 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 She informs me that she is going to [facility] to the [place] for further care. SHEREE, LALLA ([id number]) 08/19/16: pt returns today for f/u. she attended the cancer center in [place]. The right arm wound has healed and significant reduction in wound size regarding he right back. She denies systemic s/s of infection. 04/11/17 3 ADMISSION this is a patient who has been seen in this clinic previously lymphedema and an ulcerated area on her right arm. Apparently biopsy of this area showed possible angiosarcoma. She is also a patient who is battled metastatic stage IV breast cancer dating back to 2003-5. She now has bilateral mastectomies. Most of her metastasis of been in the lymph nodes and chest. At the time she was in the clinic in the fall of 17 she went to the [facility] [place] in [place] and returned with a much better condition of her arm and right upper back. I have reviewed notes from [facility] oncology Dr. Jana Hakim she has developed over the course of this year multiple cutaneous nodules on the right upper back. Some of these have ulcerated and fact today she has 3 open areas one of which has a necrotic surface and 2 smaller areas. There are other nodules palpable the do not have openings. She is not really symptomatic from this. She wonders about why these have occurred and what can be done about them. I don't really have a good sense of how these areas have responded to chemotherapy however it is clear from reviewing the notes that these are felt to be metastatic lesions. Presumably metastatic breast cancer to skin 04/25/17; 3 open areas last time she was here only one remains today. I think all of this is in the setting of nodular metastatic breast cancer to the skin a note and subcutaneous tissue  and the lower  back. One open area remains. She sees Dr. Mariea Clonts next week 05/09/17; small open area remains. I think this is all in the setting of nodular metastatic breast cancer to the skin. She asked me today about the nodules and in reviewing her records previously I have not seen these specifically labeled except by a nurse practitioner saw her recently. Nevertheless I think these are metastatic breast cancer lesions. She recently saw Dr. Jana Hakim and stated he didn't mention these. She remains on chemotherapy every 3 weeks Electronic Signature(s) Signed: 05/09/2017 5:35:48 PM By: Linton Ham MD Entered By: Linton Ham on 05/09/2017 16:59:51 [name] ([id number]) -------------------------------------------------------------------------------- Physical Exam Details Patient Name: [name] Date of Service: 05/09/2017 3:00 PM Medical Record Patient Account Number: [id number] Number: Treating RN: Baruch Gouty, RN, BSN, Rita [birth year] [phone] y.o. Other Clinician: Date of Birth/Sex: Female) Treating ROBSON, MICHAEL Primary Care Provider: [name] Provider/Extender: G Referring Provider: [name] Weeks in Treatment: 4 Constitutional Sitting or standing Blood Pressure is within target range for patient.. Pulse regular and within target range for patient.Marland Kitchen Respirations regular, non-labored and within target range.. Temperature is normal and within the target range for the patient.Marland Kitchen appears in no distress. Notes Wound exam; the area on the right upper thoracic spine. She has multiple firm cutaneous nodules however only 1 small open area which is smaller than last time. It has surface eschar which I removed with a #3 curet also some nonviable surface debris. Electronic Signature(s) Signed: 05/09/2017 5:35:48 PM By: Linton Ham MD Entered By: Linton Ham on 05/09/2017 17:00:51 [name]  ([id number]) -------------------------------------------------------------------------------- Physician Orders Details Patient Name: [name] Date of Service: 05/09/2017 3:00 PM Medical Record Patient Account Number: [id number] Number: Treating RN: Baruch Gouty, RN, BSN, Rita 16-Aug-1956 [phone] y.o. Other Clinician: Date of Birth/Sex: Female) Treating ROBSON, MICHAEL Primary Care Provider: [name] Provider/Extender: G Referring Provider: Rodney Langton in Treatment: 4 Verbal / Phone Orders: No Diagnosis Coding Wound Cleansing Wound #4 Back o Clean wound with Normal Saline. Primary Wound Dressing Wound #4 Back o Aquacel Ag Secondary Dressing Wound #4 Back o Dry Gauze o Boardered Foam Dressing Dressing Change Frequency Wound #4 Back o Change dressing every other day. Follow-up Appointments Wound #4 Back o Return Appointment in 2 weeks. Additional Orders / Instructions Wound #4 Back o Increase protein intake. o Activity as tolerated Electronic Signature(s) Signed: 05/09/2017 4:25:10 PM By: Regan Lemming BSN, RN Signed: 05/09/2017 5:35:48 PM By: Linton Ham MD Entered By: Regan Lemming on 05/09/2017 15:36:26 [name] ([id number]) -------------------------------------------------------------------------------- Problem List Details Patient Name: [name] Date of Service: 05/09/2017 3:00 PM Medical Record Patient Account Number: [id number] Number: Treating RN: Baruch Gouty, RN, BSN, Rita 01/11/1956 [phone] y.o. 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 04/11/2017 Yes Z85.3 Personal history of malignant neoplasm of breast 04/11/2017 Yes C49.11  Malignant neoplasm of connective and soft tissue of right 04/11/2017 Yes upper limb, including shoulder Inactive Problems Resolved Problems Electronic Signature(s) Signed: 05/09/2017 5:35:48 PM By: Linton Ham MD Entered By: Linton Ham on 05/09/2017 16:57:46 [name] ([id number]) -------------------------------------------------------------------------------- Progress Note Details Patient Name: [name] Date of Service: 05/09/2017 3:00 PM Medical Record Patient Account Number: [id number] Number: Treating RN: Baruch Gouty, RN, BSN, Rita Sep 21, 1956 [phone] y.o. Other Clinician: Date of Birth/Sex: Female) Treating  Linton Ham Primary Care Provider: [name] Provider/Extender: G Referring Provider: Rodney Langton in Treatment: 4 Subjective Chief Complaint Information obtained from Patient Patient presents to the wound care center for a consult due non healing wound. The patient was known to have bilateral lymphedema as a status post mastectomy syndrome now comes with an ulcerated area of her right upper arm and back which she's had for several weeks. 04/11/17; patient is here for review of wounds on her back History of Present Illness (HPI) The following HPI elements were documented for the patient's wound: Location: bilateral arm lymphedema with now skin ulceration of the right upper extremity. Quality: Patient reports experiencing a dull pain to affected area(s). Severity: Patient states wound are getting worse. Duration: Patient has had the wound for > 18 months prior to seeking treatment at the wound center Timing: Pain in wound is Intermittent (comes and goes Context: The wound occurred when the patient . Modifying Factors: Consults to this date include: local care and antibiotics as prescribed by the PCP and the ER. Associated Signs and Symptoms: Patient reports having increase discharge. 61 year old patient known to [name] from a prior visit about  a year ago, returns for care. she is known to have stage IV breast cancer, angiosarcoma and has been on treatment with the medical oncologist Dr. Jana Hakim. She has been seeing the hospice nurses earlier and has been continuing with chemotherapy. She was known to have stage IV breast cancer, originally diagnosed in April 2003 and now manifested chiefly by loco-regional nodal disease (neck, chest), without lung, liver, bone, or brain involvement. he has received extensive radiation and chemotherapy after the surgeries. She's always had bilateral lymphedema of her arms but now comes with an ulcerated area on her right upper arm posteriorly. Her medical Oncologist, Dr.Gustav [name], was concerned that what we may be seeing in Christain's right upper extremity is actually skin spread of her cancer. This needs to be biopsied and she was sending her back to general surgery with that in mind. She placed a referral to the wound clinic . She has been on doxycycline for now. The general surgeon saw her 2 days ago and did a biopsy and this report is pending. In April 2016 she had a right upper arm venous duplex study which showed: No evidence of deep vein or superficial thrombosis involving the right upper extremity and left subclavian vein. Moderate edema noted throughout the right upper extremity. 05/01/2015 the patient tells me that her surgeon revealed that her biopsy was negative and that there was no pathology found. Other than that she is doing well and she thinks her edema has gone down a bit. ILITHYIA, TITZER ([id number]) I had done a biopsy for her about a year ago and final report is not back yet but the pathologist Dr. Chrystine Oiler did speak to me on the phone a few days ago and mentioned that he is doing final stains and there may be a mixture of a angiosarcoma and metastatic breast cancer. I have not been able to speak to Dr. Jana Hakim yet but I understand the patient has seen him today and he has  discussed the provisional pathology and offered her some chemotherapy. The final pathology report is back and it shows a metastatic breast carcinoma and an atypical vascular proliferation. 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 She informs me that she is going to [facility] to the [place] for further care. 08/19/16: pt returns today for f/u. she attended the  cancer center in [place]. The right arm wound has healed and significant reduction in wound size regarding he right back. She denies systemic s/s of infection. 04/11/17 3 ADMISSION this is a patient who has been seen in this clinic previously lymphedema and an ulcerated area on her right arm. Apparently biopsy of this area showed possible angiosarcoma. She is also a patient who is battled metastatic stage IV breast cancer dating back to 2003-5. She now has bilateral mastectomies. Most of her metastasis of been in the lymph nodes and chest. At the time she was in the clinic in the fall of 17 she went to the [facility] [place] in [place] and returned with a much better condition of her arm and right upper back. I have reviewed notes from [facility] oncology Dr. Jana Hakim she has developed over the course of this year multiple cutaneous nodules on the right upper back. Some of these have ulcerated and fact today she has 3 open areas one of which has a necrotic surface and 2 smaller areas. There are other nodules palpable the do not have openings. She is not really symptomatic from this. She wonders about why these have occurred and what can be done about them. I don't really have a good sense of how these areas have responded to chemotherapy however it is clear from reviewing the notes that these are felt to be metastatic lesions. Presumably metastatic breast cancer to skin 04/25/17; 3 open areas last time she was here only one remains today. I think all of this is in the setting of nodular metastatic breast cancer to  the skin a note and subcutaneous tissue and the lower back. One open area remains. She sees Dr. Mariea Clonts next week 05/09/17; small open area remains. I think this is all in the setting of nodular metastatic breast cancer to the skin. She asked me today about the nodules and in reviewing her records previously I have not seen these specifically labeled except by a nurse practitioner saw her recently. Nevertheless I think these are metastatic breast cancer lesions. She recently saw Dr. Jana Hakim and stated he didn't mention these. She remains on chemotherapy every 3 weeks Objective Constitutional Sitting or standing Blood Pressure is within target range for patient.. Pulse regular and within target range for patient.Marland Kitchen Respirations regular, non-labored and within target range.. Temperature is normal and within the target range for the patient.Marland Kitchen appears in no distress. LAIBA, FUERTE ([id number]) Vitals Time Taken: 3:10 PM, Height: 66 in, Temperature: 98.0 F, Pulse: 70 bpm, Respiratory Rate: 16 breaths/min, Blood Pressure: 115/68 mmHg. General Notes: Wound exam; the area on the right upper thoracic spine. She has multiple firm cutaneous nodules however only 1 small open area which is smaller than last time. It has surface eschar which I removed with a #3 curet also some nonviable surface debris. Integumentary (Hair, Skin) Wound #4 status is Open. Original cause of wound was Gradually Appeared. The wound is located on the Back. The wound measures 1cm length x 1cm width x 0.2cm depth; 0.785cm^2 area and 0.157cm^3 volume. There is Fat Layer (Subcutaneous Tissue) Exposed exposed. There is no tunneling or undermining noted. There is a small amount of serosanguineous drainage noted. The wound margin is indistinct and nonvisible. There is small (1-33%) pale granulation within the wound bed. There is a large (67-100%) amount of necrotic tissue within the wound bed including Eschar. The periwound skin  appearance exhibited: Dry/Scaly. The periwound skin appearance did not exhibit: Callus, Crepitus, Excoriation, Induration, Rash,  Scarring, Maceration, Atrophie Blanche, Cyanosis, Ecchymosis, Hemosiderin Staining, Mottled, Pallor, Rubor, Erythema. Periwound temperature was noted as No Abnormality. The periwound has tenderness on palpation. 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 There was a Skin/Subcutaneous Tissue Debridement (30160-10932) debridement with total area of 1 sq cm performed by Ricard Dillon, MD. with the following instrument(s): Curette to remove Non-Viable tissue/material including Fat Layer (and Subcutaneous Tissue) Exposed, Fibrin/Slough, and Subcutaneous after achieving pain control using Lidocaine 4% Topical Solution. A time out was conducted at 15:29, prior to the start of the procedure. A Minimum amount of bleeding was controlled with Pressure. The procedure was tolerated well with a pain level of 0 throughout and a pain level of 0 following the procedure. Post Debridement Measurements: 1cm length x 1cm width x 0.2cm depth; 0.157cm^3 volume. [name], LAMBING ([id number]) Character of Wound/Ulcer Post Debridement is stable. Post procedure Diagnosis Wound #4: Same as Pre-Procedure Plan Wound Cleansing: Wound #4 Back: Clean wound with Normal Saline. Primary Wound Dressing: Wound #4 Back: Aquacel Ag Secondary Dressing: Wound #4 Back: Dry Gauze Boardered Foam Dressing Dressing Change Frequency: Wound #4 Back: Change dressing every other day. Follow-up Appointments: Wound #4 Back: Return Appointment in 2 weeks. Additional Orders / Instructions: Wound  #4 Back: Increase protein intake. Activity as tolerated #1 Aquacel Ag to continue with border foam. Miraculously these areas appear to be closing. She has done well #2 she asked me about these nodules and I must admit to some discomfort about discussing this with her. I think these are clearly metastatic breast lesions although I remember not seeing these specifically addressed by her oncologist. I asked her to discuss this with her primary oncology team Electronic Signature(s) Signed: 05/09/2017 5:35:48 PM By: Linton Ham MD Entered By: Linton Ham on 05/09/2017 17:01:48 SAHARAH, SHERROW ([id number]) RUTH, KOVICH ([id number]) -------------------------------------------------------------------------------- SuperBill Details Patient Name: [name] Date of Service: 05/09/2017 Medical Record Patient Account Number: [id number] Number: Treating RN: Baruch Gouty, RN, BSN, Rita [birth year] [phone] y.o. 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 Z85.3 Personal history of malignant neoplasm of breast C49.11 Malignant neoplasm of connective and soft tissue of right upper limb, including shoulder Facility Procedures CPT4 Code: 27035009 Description: 38182 - [name] SUBQ TISSUE 20 SQ CM/< ICD-10 Description Diagnosis L98.422 Non-pressure chronic ulcer of back with fat laye Modifier: r exposed Quantity: 1 Physician Procedures CPT4 Code: 9937169 Description: 67893 - WC PHYS SUBQ TISS 20 SQ CM ICD-10 Description Diagnosis L98.422 Non-pressure chronic ulcer of back with fat layer Modifier: exposed Quantity: 1 Electronic Signature(s) Signed: 05/09/2017 5:35:48 PM By: Linton Ham MD Entered By: Linton Ham on 05/09/2017 17:02:09

## 2017-05-15 ENCOUNTER — Other Ambulatory Visit: Payer: Self-pay | Admitting: *Deleted

## 2017-05-15 MED ORDER — METHADONE HCL 10 MG PO TABS: 10.0000 mg | ORAL_TABLET | Freq: Three times a day (TID) | ORAL | 0 refills | Status: DC

## 2017-05-15 MED ORDER — METHADONE HCL 5 MG PO TABS: 5.0000 mg | ORAL_TABLET | Freq: Three times a day (TID) | ORAL | 0 refills | Status: DC

## 2017-05-18 ENCOUNTER — Other Ambulatory Visit: Payer: Commercial Managed Care - PPO

## 2017-05-18 ENCOUNTER — Ambulatory Visit: Payer: Commercial Managed Care - PPO | Admitting: Oncology

## 2017-05-18 ENCOUNTER — Ambulatory Visit: Payer: Commercial Managed Care - PPO

## 2017-05-20 IMAGING — CR DG CHEST 2V
2 series · 2 of 2 positions shown · non-contrast
Comparison: 12/01/2016

CLINICAL DATA: Chemotherapy yesterday, fever, cough

EXAM:
CHEST  2 VIEW

[w chest pa]
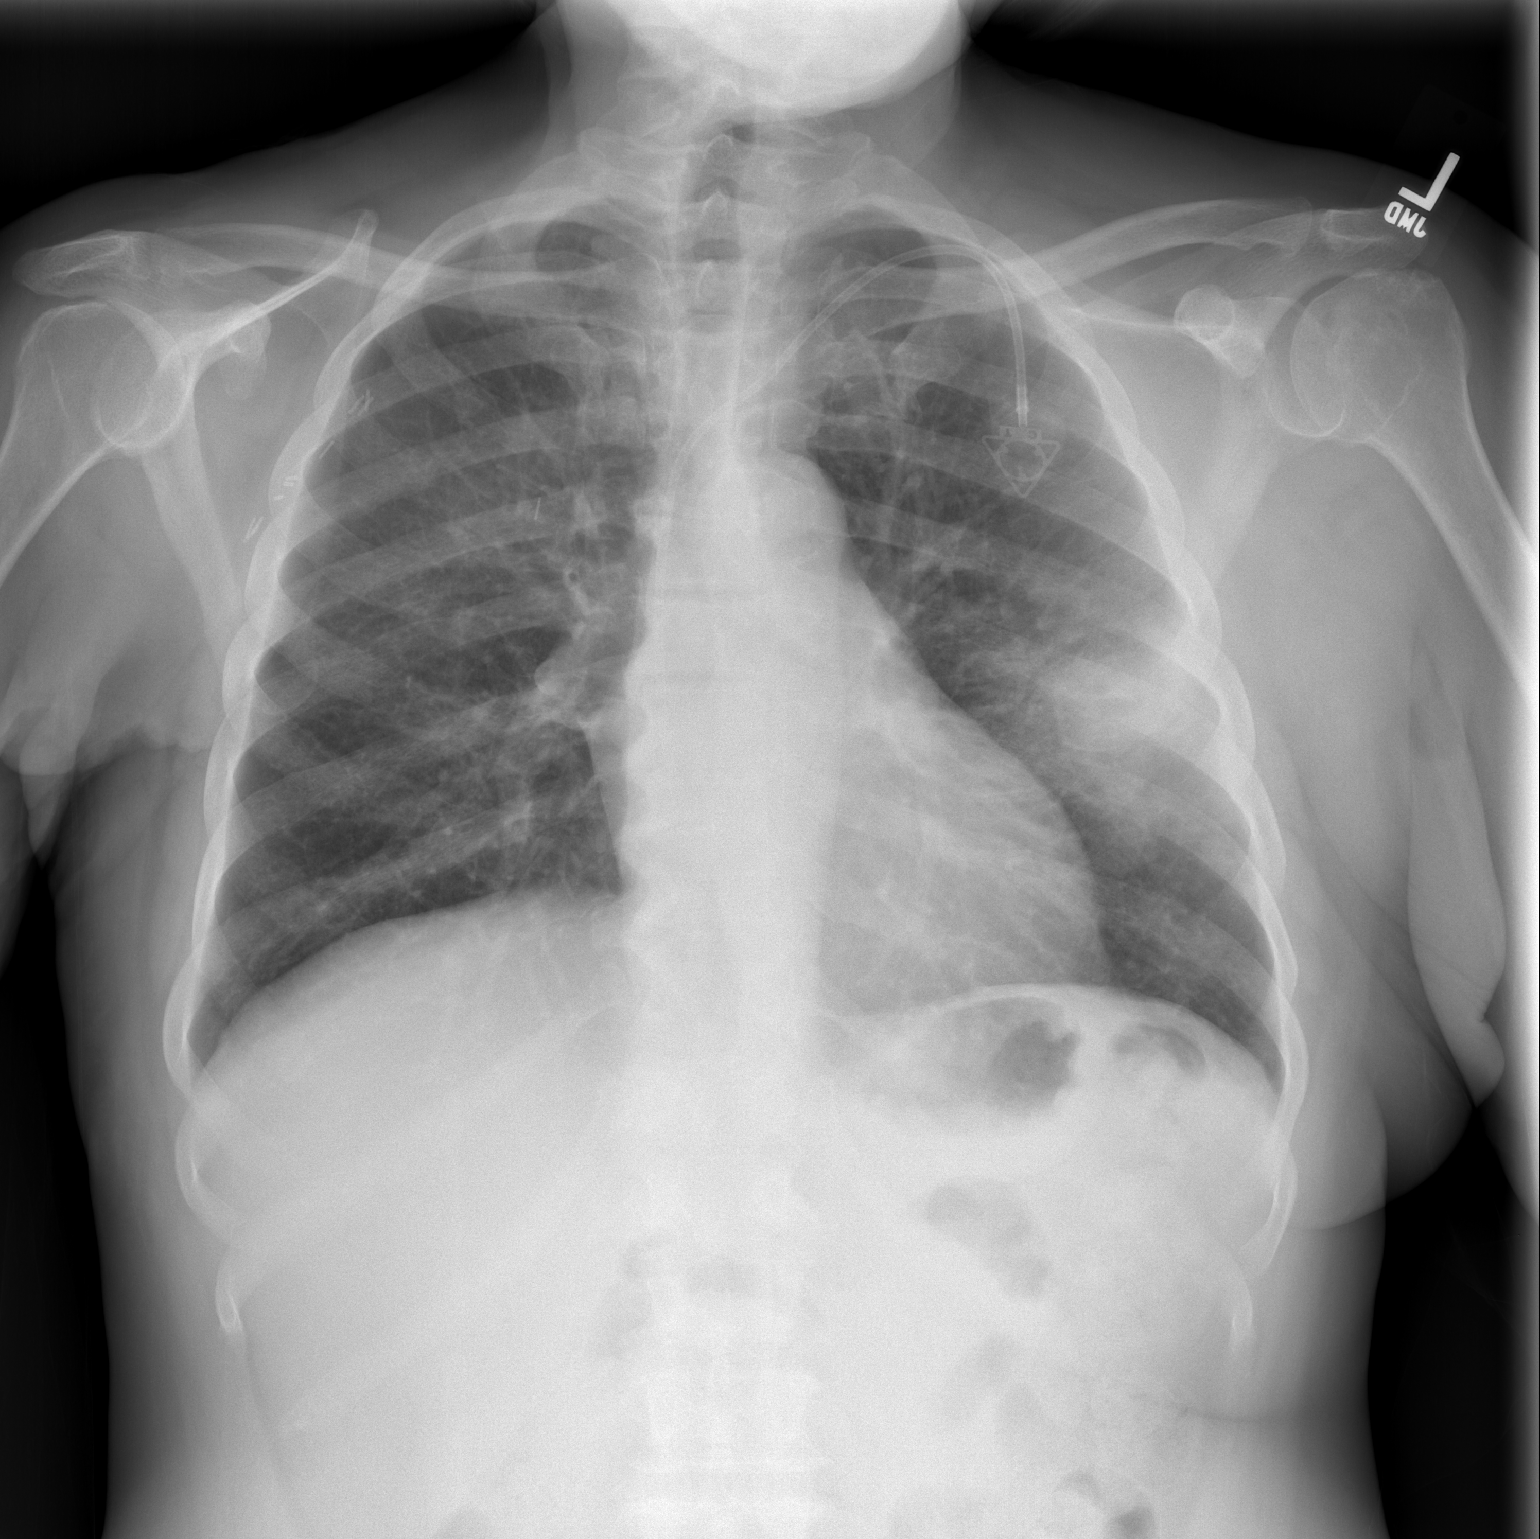

[w chest lat]
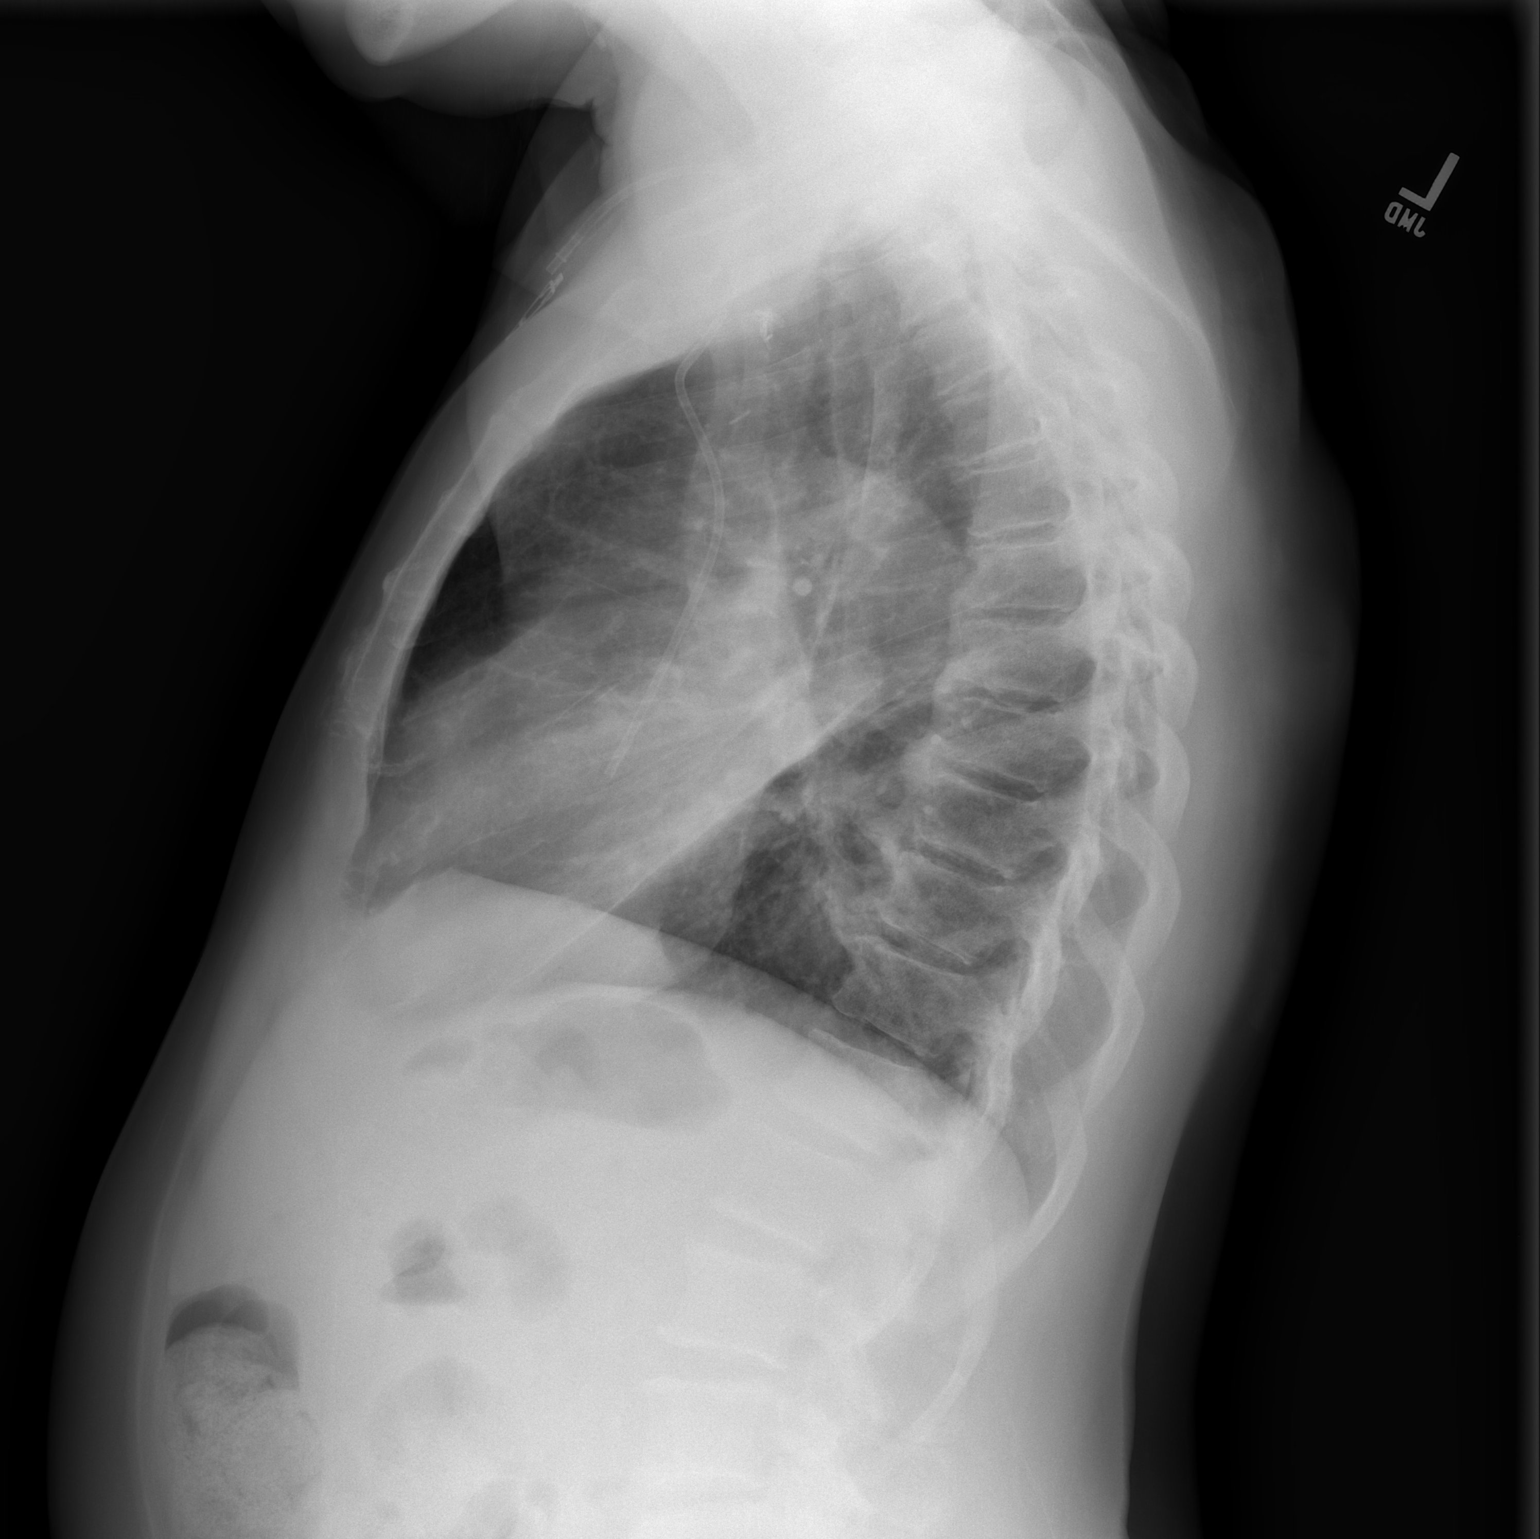

[2 of 2 positions shown; findings below may reference images not displayed]

FINDINGS: There is left upper lobe airspace disease most concerning for
pneumonia. There is no pleural effusion or pneumothorax. The heart
and mediastinal contours are unremarkable. There is a left-sided
Port-A-Cath with the tip projecting over the SVC.

The osseous structures are unremarkable.
IMPRESSION: Left upper lobe pneumonia. Followup PA and lateral chest X-ray is
recommended in 3-4 weeks following trial of antibiotic therapy to
ensure resolution and exclude underlying malignancy.

## 2017-05-23 ENCOUNTER — Ambulatory Visit: Payer: Commercial Managed Care - PPO

## 2017-05-23 ENCOUNTER — Telehealth: Payer: Self-pay | Admitting: Medical Oncology

## 2017-05-23 ENCOUNTER — Ambulatory Visit (HOSPITAL_BASED_OUTPATIENT_CLINIC_OR_DEPARTMENT_OTHER): Payer: Commercial Managed Care - PPO | Admitting: Oncology

## 2017-05-23 ENCOUNTER — Other Ambulatory Visit (HOSPITAL_BASED_OUTPATIENT_CLINIC_OR_DEPARTMENT_OTHER): Payer: Commercial Managed Care - PPO

## 2017-05-23 VITALS — BP 132/62 | HR 66 | Temp 98.0°F | Resp 18 | Ht 66.0 in | Wt 176.9 lb

## 2017-05-23 DIAGNOSIS — Z95828 Presence of other vascular implants and grafts: Secondary | ICD-10-CM

## 2017-05-23 DIAGNOSIS — C50211 Malignant neoplasm of upper-inner quadrant of right female breast: Secondary | ICD-10-CM

## 2017-05-23 DIAGNOSIS — G893 Neoplasm related pain (acute) (chronic): Secondary | ICD-10-CM

## 2017-05-23 DIAGNOSIS — C4911 Malignant neoplasm of connective and soft tissue of right upper limb, including shoulder: Secondary | ICD-10-CM | POA: Diagnosis not present

## 2017-05-23 DIAGNOSIS — Z171 Estrogen receptor negative status [ER-]: Secondary | ICD-10-CM

## 2017-05-23 DIAGNOSIS — Z86718 Personal history of other venous thrombosis and embolism: Secondary | ICD-10-CM

## 2017-05-23 DIAGNOSIS — C778 Secondary and unspecified malignant neoplasm of lymph nodes of multiple regions: Secondary | ICD-10-CM | POA: Diagnosis not present

## 2017-05-23 DIAGNOSIS — I890 Lymphedema, not elsewhere classified: Secondary | ICD-10-CM | POA: Diagnosis not present

## 2017-05-23 DIAGNOSIS — Z170 Estrogen receptor positive status [ER+]: Secondary | ICD-10-CM

## 2017-05-23 LAB — CBC WITH DIFFERENTIAL/PLATELET
BASO%: 0.6 % (ref 0.0–2.0)
Basophils Absolute: 0 10*3/uL (ref 0.0–0.1)
EOS%: 10 % — ABNORMAL HIGH (ref 0.0–7.0)
Eosinophils Absolute: 0.7 10*3/uL — ABNORMAL HIGH (ref 0.0–0.5)
HCT: 37.9 % (ref 34.8–46.6)
HGB: 12.3 g/dL (ref 11.6–15.9)
LYMPH%: 23.8 % (ref 14.0–49.7)
MCH: 27.8 pg (ref 25.1–34.0)
MCHC: 32.4 g/dL (ref 31.5–36.0)
MCV: 85.8 fL (ref 79.5–101.0)
MONO#: 0.7 10*3/uL (ref 0.1–0.9)
MONO%: 9.8 % (ref 0.0–14.0)
NEUT#: 4 10*3/uL (ref 1.5–6.5)
NEUT%: 55.8 % (ref 38.4–76.8)
Platelets: 231 10*3/uL (ref 145–400)
RBC: 4.42 10*6/uL (ref 3.70–5.45)
RDW: 15.8 % — ABNORMAL HIGH (ref 11.2–14.5)
WBC: 7.2 10*3/uL (ref 3.9–10.3)
lymph#: 1.7 10*3/uL (ref 0.9–3.3)

## 2017-05-23 LAB — COMPREHENSIVE METABOLIC PANEL
ALT: 9 U/L (ref 0–55)
AST: 20 U/L (ref 5–34)
Albumin: 3.7 g/dL (ref 3.5–5.0)
Alkaline Phosphatase: 107 U/L (ref 40–150)
Anion Gap: 11 mEq/L (ref 3–11)
BUN: 14.1 mg/dL (ref 7.0–26.0)
CO2: 30 mEq/L — ABNORMAL HIGH (ref 22–29)
Calcium: 9.5 mg/dL (ref 8.4–10.4)
Chloride: 99 mEq/L (ref 98–109)
Creatinine: 1 mg/dL (ref 0.6–1.1)
EGFR: 72 mL/min/{1.73_m2} — ABNORMAL LOW (ref >90)
Glucose: 103 mg/dl (ref 70–140)
Potassium: 3.6 mEq/L (ref 3.5–5.1)
Sodium: 140 mEq/L (ref 136–145)
Total Bilirubin: <0.22 mg/dL (ref 0.20–1.20)
Total Protein: 7.7 g/dL (ref 6.4–8.3)

## 2017-05-23 MED ORDER — SODIUM CHLORIDE 0.9 % IJ SOLN
10.0000 mL | INTRAMUSCULAR | Status: DC | PRN
  Filled 2017-05-23: qty 10

## 2017-05-23 MED ORDER — SODIUM CHLORIDE 0.9 % IJ SOLN
10.0000 mL | INTRAMUSCULAR | Status: DC | PRN
  Administered 2017-05-23: 10 mL via INTRAVENOUS
  Filled 2017-05-23: qty 10

## 2017-05-23 MED ORDER — HEPARIN SOD (PORK) LOCK FLUSH 100 UNIT/ML IV SOLN
500.0000 [IU] | Freq: Once | INTRAVENOUS | Status: AC | PRN
  Administered 2017-05-23: 500 [IU] via INTRAVENOUS
  Filled 2017-05-23: qty 5

## 2017-05-23 NOTE — Patient Instructions (Signed)

## 2017-05-23 NOTE — Telephone Encounter (Signed)
.   Her AC is broken and will be fixed Saturday. Her children do not think she should get chemo and come back to a hot house.  I called her back and she is going to come today.

## 2017-05-23 NOTE — Progress Notes (Signed)
[name]  [phone] [phone]  OFFICE PROGRESS NOTE   ID: [name]   DOB: [birth year]  MR#: [id number]  FSE#:[id number]   PCP: [name], MD [name], [name], [name] RAD IDH:[id number] [name], [name] 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  She sought attention and had a mammogram which showed an obvious carcinoma in the upper inner quadrant of the right breast, approximately 2 cm.  There were some enlarged lymph nodes in the axilla and an FNA done showed those consistent with malignant cells, most likely an invasive ductal carcinoma.  At that point she was unsure of what to do and was referred by Dr. [name] for a discussion of her treatment options.  By biopsy, it was ER/PR negative and HER-2 was 3+.  DNA index was 1.42.  We reiterated that it was most important to have her disease surgically addressed at that point and had recommended lumpectomy and axillary nodes to be addressed with which she followed through and on 04-03-02, Dr. [name] performed a right partial mastectomy and a right axillary lymph node dissection.  Final pathology revealed a 2.4 cm. high grade, Grade III invasive ductal carcinoma with an adjacent .8 cm. also high grade invasive ductal carcinoma which was felt to represent an intramammary metastasis rather than a second primary.  A smaller mass was just medial to the larger mass.  The smaller mass was associated with high grade DCIS component.  There was no definite lymphovascular invasion identified.  However, one of fourteen axillary lymph nodes did contain  a 1.5 cm. metastatic deposit.  Postoperatively she did very well.  We reiterated the fact that she did need adjuvant chemotherapy, however, she refused and decided that she would pursue radiation.  She received radiation and completed that on 08-14-02. Her subsequent history is as detailed below.   INTERVAL HISTORY: [name] returns today for follow-up and treatment of her estrogen receptor negative breast cancer and angiosarcoma. She continues on Abraxane every 3 weeks, together with Pertuzumab and trastuzumab. She generally tolerates the treatments remarkably well. She is due to treatment today, but she would like to postpone it because her air conditioner has broken down and she does not think it going to be repaired until the weekend.  Her most recent echocardiogram was 02/20/2017 showing a well-preserved ejection fraction.   REVIEW OF SYSTEMS: The air conditioning problem has moderate feeling down. She wonders how many more problems she is going to have to deal with. She tells me her husband is supposed to be taking care of this but she does not know if he will be able to. Meanwhile she tells me her children took her out for her birthday, they had some cocktails had a very nice medial and she enjoyed that a great deal. She goes to the wound clinic tomorrow and she has very good reports regarding the care she is receiving there. Aside from these issues a detailed review of systems today was stable   PAST MEDICAL HISTORY: Past Medical History:  Diagnosis Date  . Breast cancer ([place])   . DVT (deep venous thrombosis) ([place]) 10/07/2011  . Hypertension   .  Radiation 11/29/2005-12/29/2005   right supraclavicular area 4147 cGy  . Radiation 06/26/02-08/14/02   right breast 5040 cGy, tumor bed boosted to 1260 cGy  . Rheumatic fever     PAST SURGICAL HISTORY: Past Surgical History:  Procedure Laterality Date  . BREAST SURGERY    . MASTECTOMY     bilateral  . PORT A CATH REVISION      FAMILY  HISTORY Family History  Problem Relation Age of Onset  . Pancreatic cancer Mother   . Kidney cancer Sister 57  . Breast cancer Sister 54  . Breast cancer Other 44  The patient's father died in his 96V  from complications of alcohol abuse. The patient's mother died in her 51s from pancreatic cancer. The patient has 6 brothers, 2 sisters. Both her sisters have had breast cancer, both diagnosed after the age of 67. The patient has not had genetic testing so far.   GYNECOLOGIC HISTORY: Menarche age 63; first live birth age 32. She is GXP4. She is not sure when she stopped having periods. She never used hormone replacement.  SOCIAL HISTORY: The patient has worked in the past as a Psychologist, counselling. At home in addition to the patient are her husband [name], originally from [place], who works as a Administrator.  Also living in the home is her daughter [name] (she is studying to be a Marine scientist).  The other 2 children are [name], who has a college degree in psychology and works as a Probation officer; and [name], who lives in [place] and works as a Higher education careers adviser.[name] also sometimes stays in the home. In addition the patient has an aid who helps her almost daily.   ADVANCED DIRECTIVES: Not in place  HEALTH MAINTENANCE: Social History  Substance Use Topics  . Smoking status: Light Tobacco Smoker  . Smokeless tobacco: Never Used  . Alcohol use Yes     Comment: Rarely     Colonoscopy:  Not on file  PAP:  Not on file  Bone density:  Not on file  Lipid panel:  Not on file    Allergies  Allergen Reactions  . Pork-Derived Products Other (See Comments)    Pt says she just doesn't eat pork; has no reaction to pork products  . Tramadol Nausea Only  . Hydrocodone-Acetaminophen Itching and Rash    Current Outpatient Prescriptions  Medication Sig Dispense Refill  . alprazolam (XANAX) 2 MG tablet Take 2 mg by mouth daily.     . Ascorbic Acid (VITAMIN C) 100 MG tablet Take 100 mg by mouth daily.        . calcium-vitamin D (OSCAL WITH D) 500-200 MG-UNIT per tablet Take 1 tablet by mouth daily.      Marland Kitchen gabapentin (NEURONTIN) 300 MG capsule Take 1 capsule by mouth daily.    . methadone (DOLOPHINE) 10 MG tablet Take 1 tablet (10 mg total) by mouth every 8 (eight) hours. Take with 5 mg tablet to equal 15 mg every 8 hours 90 tablet 0  . methadone (DOLOPHINE) 5 MG tablet Take 1 tablet (5 mg total) by mouth every 8 (eight) hours. Take with 10 mg tab to equal 15 mg every 8 hours 90 tablet 0  . Multiple Vitamin (MULTIVITAMIN) capsule Take 1 capsule by mouth daily.      Marland Kitchen oxyCODONE-acetaminophen (PERCOCET) 10-325 MG tablet Take 2 tablets by mouth every 4 (four) hours as needed.  0  . oxymorphone (OPANA) 10 MG tablet Take 1 tablet by mouth 2 (two) times daily.  0  . prochlorperazine (COMPAZINE) 10 MG tablet Take 1 tablet by mouth every 6 (six) hours as needed.    . sertraline (ZOLOFT) 50 MG tablet Take 1 tablet (50 mg total) by mouth daily. 30 tablet 3  . triamterene-hydrochlorothiazide (DYAZIDE) 37.5-25 MG capsule Take 1 each (1 capsule total) by mouth every morning. 30 capsule 3   Current Facility-Administered Medications  Medication Dose Route Frequency Provider Last Rate Last Dose  . sodium chloride 0.9 % injection 10 mL  10 mL Intravenous PRN [name], Valentino Hue, MD   10 mL at 05/23/17 1439   Facility-Administered Medications Ordered in Other Visits  Medication Dose Route Frequency Provider Last Rate Last Dose  . sodium chloride 0.9 % injection 10 mL  10 mL Intravenous PRN [name], Valentino Hue, MD   10 mL at 03/04/14 1421    OBJECTIVE: Middle-aged African-American womanWho appears stated age   Vitals:   05/23/17 1404  BP: 132/62  Pulse: 66  Resp: 18  Temp: 98 F (36.7 C)     Body mass index is 28.55 kg/m.    ECOG FS: 1 Filed Weights   05/23/17 1404  Weight: 176 lb 14.4 oz (80.2 kg)    Sclerae unicteric, pupils round and equal Oropharynx clear and moist No cervical or supraclavicular  adenopathy Lungs no rales or rhonchi Heart regular rate and rhythm Abd soft, nontender, positive bowel sounds MSK no focal spinal tenderness,chronic upper extremity lymphedema with sleeve in place Neuro: nonfocal, well oriented, despondent affect Breasts: Deferred  Photo 04/06/2017    LAB RESULTS:.  Lab Results  Component Value Date   WBC 7.2 05/23/2017   NEUTROABS 4.0 05/23/2017   HGB 12.3 05/23/2017   HCT 37.9 05/23/2017   MCV 85.8 05/23/2017   PLT 231 05/23/2017      Chemistry      Component Value Date/Time   NA 140 05/23/2017 1238   K 3.6 05/23/2017 1238   CL 105 01/24/2017 0657   CL 101 03/07/2013 1411   CO2 30 (H) 05/23/2017 1238   BUN 14.1 05/23/2017 1238   CREATININE 1.0 05/23/2017 1238      Component Value Date/Time   CALCIUM 9.5 05/23/2017 1238   ALKPHOS 107 05/23/2017 1238   AST 20 05/23/2017 1238   ALT 9 05/23/2017 1238   BILITOT <0.22 05/23/2017 1238       STUDIES: Most recent scans was a 01-23-2017 CT which showed no pulmonary embolism, small left effusion, and radiation changes in the right lung.Chest x-ray March 1928 showed her left-sided pacemaker unchanged in position and a stable left midlung opacity.   ASSESSMENT:  61 y.o. [place] woman with stage IV breast cancer manifested chiefly by loco-regional nodal disease (neck, chest) and skin involvement, without liver, bone, or brain metastases documented   (1) Status post right upper inner quadrant lumpectomy and sentinel lymph node sampling 03/04/2002 for 2 separate foci of invasive ductal carcinoma, mpT2 pN1 or stage IIB, both foci grade 3, both estrogen and progesterone receptor positive, both HercepTest 3+, Mib-1 56%  (2) Reexcision for margins 05/27/2002 showed no residual cancer in the breast.  (3) The patient refused adjuvant systemic therapy.  (4) Adjuvant radiation treatment completed 08/14/2002.  (5) recurrence in the right breast in 02/2004 showing a morphologically different  tumor, again grade 3, again estrogen and progesterone receptor negative, with an MIB-1 of 14% and Herceptest 3+.  (5) Between 03/2004 and 07/2004 she received dose dense Doxorubicin/Cyclophosphamide x 4 given with trastuzumab, followed by weekly Docetaxel  x 8, again given with trastuzumab.  (6) Right mastectomy 07/13/2004 showed scattered microscopic foci of residual disease over an area  greater than 5 cm. Margins were negative.  (7) Postoperative Docetaxel continued until 09/2004.  (8) Trastuzumab (Herceptin) given 08/2004 through 01/2012 with some brief interruptions.  (9) Isolated right cervical nodal recurrence 10/2005, treated with radiation to the right supraclavicular area (total 41.5 gray) completed 12/29/2005.  (10) Navelbine given together with Herceptin  11/2005 through 03/2006.  (9) Left mastectomy 02/13/2006 for ductal carcinoma in situ, grade 2, estrogen and progesterone receptor negative, with negative margins; 0 of 3 lymph nodes involved  (10) treated with Lapatinib and Capecitabine before 10/2009, for an unclear duration and with unclear results (cannot locate data on chart review).  (11) Status post right supraclavicular lymph node biopsy 09/2010 again positive for an invasive ductal carcinoma, estrogen and progesterone receptor negative, HER-2 positive by CISH with a ratio 4.25.  (12) Navelbine given together with Herceptin between 05/2011 and 11/2011.  (13) Carboplatin/ Gemcitabine/ Herceptin given for 2 cycles, in 12/2011 and 01/2012.  (14) TDM-1 ([place]) started 02/2012. Last dose 10/02/2013 after which the patient discontinued treatment at her own discretion. Echo on December 2014 showed a well preserved ejection fraction.  (15) Deep vein thrombosis of the right upper extremity documented 04/20/2011.  She completed anticoagulant therapy with Coumadin on 03/25/2013.  (16) Chronic right upper extremity lymphedema, not responsive to aggressive PT  (a) biopsy of  denuded area 04/23/2015 read as dermatofibroma (discordant)  (b) deeper cuts of 04/23/2015 biopsy suggest angiosarcoma  (17) Right chest port-a-cath removal due to infection on 01/28/2013. Left chest Port-A-Cath placed on 04/08/2013; being flushed every 6 weeks  RIGHT UPPER EXTREMITY ANGIOSARCOMA VS BREAST CANCER: August 2016 (18) treated at [facility] of [place] August 2016 with paclitaxel, trastuzumab and pertuzumab 1 cycle, afterwards referred to hospice  (19) under the care of hospice of [name] August 2016 to 05/08/2016, when the patient opted for a second try at chemotherapy  (20) started low-dose Abraxane, trastuzumab and pertuzumab 06/07/2016, to be repeated every 3 weeks.   (a) pretreatment echocardiogram 06/06/2016 showed a 60-65% ejection fraction  (b) significant compliance problems compromise response to treatment  (c) last echocardiogram 02/20/17 LVEF 55-60%  (21) Chronic pain secondary to known metastatic disease  (a) patient signed pain contract 10/19/2016  (b) as of 11/16/2016 receives her pain medications from Dr [name]   PLAN: Itxel is generally stable on her current treatment and she is tolerating it well. She was due for treatment today but we are postponing a total next week, because of concerns regarding the heat says now she does not have air conditioning. Hopefully this problem can be taking care of in the next few days.  Accordingly we are moving her treatment to July 24. Instead of changing the subsequent treatments that she has scheduled, I am going to change the dose of trastuzumab and pertuzumab so that she will be able to return August 7 for her next treatment as already scheduled.  She knows to call for any other issues that may develop before her next treatment and visit here.  Chauncey Cruel, MD 05/24/2017 8:08 AM

## 2017-05-24 ENCOUNTER — Encounter: Payer: Commercial Managed Care - PPO | Admitting: Physician Assistant

## 2017-05-24 DIAGNOSIS — L98429 Non-pressure chronic ulcer of back with unspecified severity: Secondary | ICD-10-CM | POA: Diagnosis not present

## 2017-05-24 DIAGNOSIS — C4911 Malignant neoplasm of connective and soft tissue of right upper limb, including shoulder: Secondary | ICD-10-CM | POA: Diagnosis not present

## 2017-05-25 NOTE — Progress Notes (Signed)
ALEATHA, TAITE ([id number]) Visit Report for 05/24/2017 Arrival Information Details Patient Name: [name], [name] Date of Service: 05/24/2017 2:00 PM Medical Record Patient Account Number: [id number] Number: Treating RN: Baruch Gouty, RN, BSN, Rita 23-Nov-1955 [phone] y.o. Other Clinician: Date of Birth/Sex: Female) Treating ROBSON, MICHAEL Primary Care Azavion Bouillon: [name] Kartik Fernando/Extender: G Referring Fredrika Canby: Rodney Langton in Treatment: 6 Visit Information History Since Last Visit All ordered tests and consults were completed: No Patient Arrived: Ambulatory Added or deleted any medications: No Arrival Time: 14:12 Any new allergies or adverse reactions: No Accompanied By: self Had a fall or experienced change in No Transfer Assistance: None activities of daily living that may affect Patient Identification Verified: Yes risk of falls: Secondary Verification Process Yes Signs or symptoms of abuse/neglect since last No Completed: visito Patient Requires Transmission-Based No Hospitalized since last visit: No Precautions: Has Dressing in Place as Prescribed: Yes Patient Has Alerts: No Pain Present Now: No Electronic Signature(s) Signed: 05/24/2017 6:10:04 PM By: Regan Lemming BSN, RN Entered By: Regan Lemming on 05/24/2017 14:13:22 [name] ([id number]) -------------------------------------------------------------------------------- Clinic Level of Care Assessment Details Patient Name: [name] Date of Service: 05/24/2017 2:00 PM Medical Record Patient Account Number: [id number] Number: Treating RN: Baruch Gouty, RN, BSN, Rita Apr 12, 1956 [phone] y.o. Other Clinician: Date of Birth/Sex: Female) Treating ROBSON, [place] Primary Care Innocence Schlotzhauer: [name] Jahred Tatar/Extender: G Referring Shatora Weatherbee: Rodney Langton in Treatment: 6 Clinic Level of Care Assessment Items TOOL 4 Quantity Score []  - Use when only an EandM is performed on FOLLOW-UP  visit 0 ASSESSMENTS - Nursing Assessment / Reassessment X - Reassessment of Co-morbidities (includes updates in patient status) 1 10 X - Reassessment of Adherence to Treatment Plan 1 5 ASSESSMENTS - Wound and Skin Assessment / Reassessment X - Simple Wound Assessment / Reassessment - one wound 1 5 []  - Complex Wound Assessment / Reassessment - multiple wounds 0 []  - Dermatologic / Skin Assessment (not related to wound area) 0 ASSESSMENTS - Focused Assessment []  - Circumferential Edema Measurements - multi extremities 0 []  - Nutritional Assessment / Counseling / Intervention 0 []  - Lower Extremity Assessment (monofilament, tuning fork, pulses) 0 []  - Peripheral Arterial Disease Assessment (using hand held doppler) 0 ASSESSMENTS - Ostomy and/or Continence Assessment and Care []  - Incontinence Assessment and Management 0 []  - Ostomy Care Assessment and Management (repouching, etc.) 0 PROCESS - Coordination of Care X - Simple Patient / Family Education for ongoing care 1 15 []  - Complex (extensive) Patient / Family Education for ongoing care 0 []  - Staff obtains Programmer, systems, Records, Test Results / Process Orders 0 []  - Staff telephones HHA, Nursing Homes / Clarify orders / etc 0 [name], [name] ([id number]) []  - Routine Transfer to another Facility (non-emergent condition) 0 []  - Routine Hospital Admission (non-emergent condition) 0 []  - New Admissions / Biomedical engineer / Ordering NPWT, Apligraf, etc. 0 []  - Emergency Hospital Admission (emergent condition) 0 []  - Simple Discharge Coordination 0 []  - Complex (extensive) Discharge Coordination 0 PROCESS - Special Needs []  - Pediatric / Minor Patient Management 0 []  - Isolation Patient Management 0 []  - Hearing / Language / Visual special needs 0 []  - Assessment of Community assistance (transportation, D/C planning, etc.) 0 []  - Additional assistance / Altered mentation 0 []  - Support Surface(s) Assessment (bed, cushion, seat, etc.)  0 INTERVENTIONS - Wound Cleansing / Measurement X - Simple Wound Cleansing - one wound 1 5 []  - Complex Wound Cleansing - multiple wounds 0 X - Wound Imaging (photographs -  any number of wounds) 1 5 []  - Wound Tracing (instead of photographs) 0 []  - Simple Wound Measurement - one wound 0 []  - Complex Wound Measurement - multiple wounds 0 INTERVENTIONS - Wound Dressings []  - Small Wound Dressing one or multiple wounds 0 []  - Medium Wound Dressing one or multiple wounds 0 []  - Large Wound Dressing one or multiple wounds 0 []  - Application of Medications - topical 0 []  - Application of Medications - injection 0 [name], [name] ([id number]) INTERVENTIONS - Miscellaneous []  - External ear exam 0 []  - Specimen Collection (cultures, biopsies, blood, body fluids, etc.) 0 []  - Specimen(s) / Culture(s) sent or taken to Lab for analysis 0 []  - Patient Transfer (multiple staff / Harrel Lemon Lift / Similar devices) 0 []  - Simple Staple / Suture removal (25 or less) 0 []  - Complex Staple / Suture removal (26 or more) 0 []  - Hypo / Hyperglycemic Management (close monitor of Blood Glucose) 0 []  - Ankle / Brachial Index (ABI) - do not check if billed separately 0 X - Vital Signs 1 5 Has the patient been seen at the hospital within the last three years: Yes Total Score: 50 Level Of Care: New/Established - Level 2 Electronic Signature(s) Signed: 05/24/2017 6:10:04 PM By: Regan Lemming BSN, RN Entered By: Regan Lemming on 05/24/2017 17:55:03 [name] ([id number]) -------------------------------------------------------------------------------- Encounter Discharge Information Details Patient Name: [name] Date of Service: 05/24/2017 2:00 PM Medical Record Patient Account Number: [id number] Number: Treating RN: Baruch Gouty, RN, BSN, Rita 07/20/1956 [phone] y.o. Other Clinician: Date of Birth/Sex: Female) Treating ROBSON, MICHAEL Primary Care Kaelynne Christley: [name] Richmond Coldren/Extender: G Referring  Oniyah Rohe: Rodney Langton in Treatment: 6 Encounter Discharge Information Items Discharge Pain Level: 0 Discharge Condition: Stable Ambulatory Status: Ambulatory Discharge Destination: Home Transportation: Private Auto Accompanied By: self Schedule Follow-up Appointment: No Medication Reconciliation completed No and provided to Patient/Care Charie Pinkus: Patient Clinical Summary of Care: Declined Electronic Signature(s) Signed: 05/24/2017 5:55:48 PM By: Regan Lemming BSN, RN Previous Signature: 05/24/2017 2:32:13 PM Version By: Ruthine Dose Entered By: Regan Lemming on 05/24/2017 17:55:48 [name] ([id number]) -------------------------------------------------------------------------------- Lower Extremity Assessment Details Patient Name: [name] Date of Service: 05/24/2017 2:00 PM Medical Record Patient Account Number: [id number] Number: Treating RN: Baruch Gouty, RN, BSN, Rita 1956-02-28 [phone] y.o. Other Clinician: Date of Birth/Sex: Female) Treating ROBSON, MICHAEL Primary Care Lang Zingg: [name] Macallister Ashmead/Extender: G Referring Shantrice Rodenberg: [name] Weeks in Treatment: 6 Electronic Signature(s) Signed: 05/24/2017 6:10:04 PM By: Regan Lemming BSN, RN Entered By: Regan Lemming on 05/24/2017 14:13:05 [name] ([id number]) -------------------------------------------------------------------------------- Multi Wound Chart Details Patient Name: [name] Date of Service: 05/24/2017 2:00 PM Medical Record Patient Account Number: [id number] Number: Treating RN: Baruch Gouty, RN, BSN, Rita [birth year] [phone] y.o. Other Clinician: Date of Birth/Sex: Female) Treating ROBSON, MICHAEL Primary Care Talise Sligh: [name] Tniyah Nakagawa/Extender: G Referring Javonta Gronau: [name] Weeks in Treatment: 6 Vital Signs Height(in): 66 Pulse(bpm): 66 Weight(lbs): Blood Pressure 94/60 (mmHg): Body Mass Index(BMI): Temperature(F): 97.9 Respiratory  Rate 16 (breaths/min): Photos: [4:No Photos] [N/A:N/A] Wound Location: [4:Back] [N/A:N/A] Wounding Event: [4:Gradually Appeared] [N/A:N/A] Primary Etiology: [4:Malignant Wound] [N/A:N/A] Comorbid History: [4:Lymphedema, Deep Vein N/A Thrombosis, Hypertension, Received Chemotherapy, Received Radiation, Confinement Anxiety] Date Acquired: [4:08/17/2016] [N/A:N/A] Weeks of Treatment: [4:6] [N/A:N/A] Wound Status: [4:Healed - Epithelialized N/A] Measurements L x W x D 0x0x0 [N/A:N/A] (cm) Area (cm) : [4:0] [N/A:N/A] Volume (cm) : [4:0] [N/A:N/A] % Reduction in Area: [4:100.00%] [N/A:N/A] % Reduction in Volume: 100.00% [N/A:N/A] Classification: [4:Full Thickness Without N/A Exposed Support Structures]  Exudate Amount: [4:None Present] [N/A:N/A] Wound Margin: [4:Flat and Intact] [N/A:N/A] Granulation Amount: [4:None Present (0%)] [N/A:N/A] Necrotic Amount: [4:None Present (0%)] [N/A:N/A] Exposed Structures: [4:Fascia: No Fat Layer (Subcutaneous] [N/A:N/A] Tissue) Exposed: No Tendon: No Muscle: No Joint: No Bone: No Epithelialization: Large (67-100%) N/A N/A Periwound Skin Texture: Excoriation: No N/A N/A Induration: No Callus: No Crepitus: No Rash: No Scarring: No Periwound Skin Maceration: No N/A N/A Moisture: Dry/Scaly: No Periwound Skin Color: Atrophie Blanche: No N/A N/A Cyanosis: No Ecchymosis: No Erythema: No Hemosiderin Staining: No Mottled: No Pallor: No Rubor: No Temperature: No Abnormality N/A N/A Tenderness on No N/A N/A Palpation: Wound Preparation: Ulcer Cleansing: N/A N/A Rinsed/Irrigated with Saline Topical Anesthetic Applied: None Treatment Notes Electronic Signature(s) Signed: 05/24/2017 5:53:54 PM By: Regan Lemming BSN, RN Previous Signature: 05/24/2017 4:47:30 PM Version By: Linton Ham MD Entered By: Regan Lemming on 05/24/2017 17:53:54 [name]  ([id number]) -------------------------------------------------------------------------------- Multi-Disciplinary Care Plan Details Patient Name: [name] Date of Service: 05/24/2017 2:00 PM Medical Record Patient Account Number: [id number] Number: Treating RN: Baruch Gouty, RN, BSN, Rita 1956/10/11 [phone] y.o. Other Clinician: Date of Birth/Sex: Female) Treating ROBSON, MICHAEL Primary Care Milianna Ericsson: [name] Chong Wojdyla/Extender: G Referring Gricel Copen: [name] Weeks in Treatment: 6 Active Inactive Electronic Signature(s) Signed: 05/24/2017 5:53:32 PM By: Regan Lemming BSN, RN Entered By: Regan Lemming on 05/24/2017 17:53:31 [name] ([id number]) -------------------------------------------------------------------------------- Pain Assessment Details Patient Name: [name] Date of Service: 05/24/2017 2:00 PM Medical Record Patient Account Number: [id number] Number: Treating RN: Baruch Gouty, RN, BSN, Rita [birth year] [phone] y.o. Other Clinician: Date of Birth/Sex: Female) Treating ROBSON, MICHAEL Primary Care Daniel Ritthaler: [name] Venie Montesinos/Extender: G Referring Taegan Standage: Rodney Langton in Treatment: 6 Active Problems Location of Pain Severity and Description of Pain Patient Has Paino No Site Locations With Dressing Change: No Pain Management and Medication Current Pain Management: Electronic Signature(s) Signed: 05/24/2017 6:10:04 PM By: Regan Lemming BSN, RN Entered By: Regan Lemming on 05/24/2017 14:13:15 [name] ([id number]) -------------------------------------------------------------------------------- Patient/Caregiver Education Details Patient Name: [name] Date of Service: 05/24/2017 2:00 PM Medical Record Patient Account Number: [id number] Number: Treating RN: Baruch Gouty, RN, BSN, Rita [birth year] [phone] y.o. Other Clinician: Date of Birth/Gender: Female) Treating ROBSON, MICHAEL Primary Care Physician: [name] Physician/Extender: G Referring Physician: Rodney Langton in Treatment: 6 Education Assessment Education Provided To: Patient Education Topics Provided Electronic Signature(s) Signed: 05/24/2017 6:10:04 PM By: Regan Lemming BSN, RN Entered By: Regan Lemming on 05/24/2017 17:55:59 [name] ([id number]) -------------------------------------------------------------------------------- Wound Assessment Details Patient Name: [name] Date of Service: 05/24/2017 2:00 PM Medical Record Number: [id number] Patient Account Number: [id number] Date of Birth/Sex: [birth year] (61 y.o. Female) Treating RN: Afful, RN, BSN, [place] Primary Care Champagne Paletta: [name] Other Clinician: Referring Ronnette Rump: [name] Treating Garhett Bernhard/Extender: Melburn Hake, HOYT Weeks in Treatment: 6 Wound Status Wound Number: 4 Primary Malignant Wound Etiology: Wound Location: Back Wound Healed - Epithelialized Wounding Event: Gradually Appeared Status: Date Acquired: 08/17/2016 Comorbid Lymphedema, Deep Vein Thrombosis, Weeks Of Treatment: 6 History: Hypertension, Received Chemotherapy, Clustered Wound: No Received Radiation, Confinement Anxiety Photos Photo Uploaded By: Regan Lemming on 05/24/2017 18:23:48 Wound Measurements Length: (cm) 0 Width: (cm) 0 Depth: (cm) 0 Area: (cm) 0 Volume: (cm) 0 % Reduction in Area: 100% % Reduction in Volume: 100% Epithelialization: Large (67-100%) Tunneling: No Undermining: No Wound Description Full Thickness Without Exposed Foul Odor Aft Classification: Support Structures Slough/Fibrin Wound Margin: Flat and Intact Exudate None Present Amount: er Cleansing: No o No Wound Bed Granulation Amount: None Present (0%) Exposed Structure  Necrotic Amount: None Present (0%) Fascia Exposed: No Fat Layer (Subcutaneous Tissue) Exposed: No [name], [name] ([id number]) Tendon Exposed: No Muscle Exposed: No Joint Exposed: No Bone Exposed:  No Periwound Skin Texture Texture Color No Abnormalities Noted: No No Abnormalities Noted: No Callus: No Atrophie Blanche: No Crepitus: No Cyanosis: No Excoriation: No Ecchymosis: No Induration: No Erythema: No Rash: No Hemosiderin Staining: No Scarring: No Mottled: No Pallor: No Moisture Rubor: No No Abnormalities Noted: No Dry / Scaly: No Temperature / Pain Maceration: No Temperature: No Abnormality Wound Preparation Ulcer Cleansing: Rinsed/Irrigated with Saline Topical Anesthetic Applied: None Electronic Signature(s) Signed: 05/24/2017 6:10:04 PM By: Regan Lemming BSN, RN Entered By: Regan Lemming on 05/24/2017 14:25:15 [name] ([id number]) -------------------------------------------------------------------------------- Vitals Details Patient Name: [name] Date of Service: 05/24/2017 2:00 PM Medical Record Number: [id number] Patient Account Number: [id number] Date of Birth/Sex: 04/27/1956 (61 y.o. Female) Treating RN: Afful, RN, BSN, Rita Primary Care Earlean Fidalgo: [name] Other Clinician: Referring Scottlyn Mchaney: [name] Treating Zayin Valadez/Extender: Melburn Hake, HOYT Weeks in Treatment: 6 Vital Signs Time Taken: 14:15 Temperature (F): 97.9 Height (in): 66 Pulse (bpm): 66 Respiratory Rate (breaths/min): 16 Blood Pressure (mmHg): 94/60 Reference Range: 80 - 120 mg / dl Electronic Signature(s) Signed: 05/24/2017 6:10:04 PM By: Regan Lemming BSN, RN Entered By: Regan Lemming on 05/24/2017 14:15:38

## 2017-05-25 NOTE — Progress Notes (Addendum)
MAUI, AHART ([id number]) Visit Report for 05/24/2017 HPI Details Patient Name: [name], [name] Date of Service: 05/24/2017 2:00 PM Medical Record Patient Account Number: [id number] Number: Treating RN: Baruch Gouty, RN, BSN, Rita 06/05/1956 [phone] y.o. Other Clinician: Date of Birth/Sex: Female) Treating Nalany Steedley Primary Care Provider: [name] Provider/Extender: G Referring Provider: Rodney Langton in Treatment: 6 History of Present Illness Location: bilateral arm lymphedema with now skin ulceration of the right upper extremity. Quality: Patient reports experiencing a dull pain to affected area(s). Severity: Patient states wound are getting worse. Duration: Patient has had the wound for > 18 months prior to seeking treatment at the wound center Timing: Pain in wound is Intermittent (comes and goes Context: The wound occurred when the patiento. Modifying Factors: Consults to this date include: local care and antibiotics as prescribed by the PCP and the ER. Associated Signs and Symptoms: Patient reports having increase discharge. HPI Description: 61 year old patient known to [name] from a prior visit about a year ago, returns for care. she is known to have stage IV breast cancer, angiosarcoma and has been on treatment with the medical oncologist Dr. Jana Hakim. She has been seeing the hospice nurses earlier and has been continuing with chemotherapy. She was known to have stage IV breast cancer, originally diagnosed in April 2003 and now manifested chiefly by loco-regional nodal disease (neck, chest), without lung, liver, bone, or brain involvement. he has received extensive radiation and chemotherapy after the surgeries. She's always had bilateral lymphedema of her arms but now comes with an ulcerated area on her right upper arm posteriorly. Her medical Oncologist, Dr.Gustav [name], was concerned that what we may be seeing in [name]'s right upper extremity is  actually skin spread of her cancer. This needs to be biopsied and she was sending her back to general surgery with that in mind. She placed a referral to the wound clinic . She has been on doxycycline for now. The general surgeon saw her 2 days ago and did a biopsy and this report is pending. In April 2016 she had a right upper arm venous duplex study which showed: No evidence of deep vein or superficial thrombosis involving the ooright upper extremity and left subclavian vein. Moderate edema noted throughout the right upper extremity. 05/01/2015 the patient tells me that her surgeon revealed that her biopsy was negative and that there was no pathology found. Other than that she is doing well and she thinks her edema has gone down a bit. I had done a biopsy for her about a year ago and final report is not back yet but the pathologist Dr. Chrystine Oiler did speak to me on the phone a few days ago and mentioned that he is doing final stains and there may be a mixture of a angiosarcoma and metastatic breast cancer. I have not been able to speak to Dr. Jana Hakim yet but I understand the patient has seen him today and he has discussed the provisional pathology and offered her some chemotherapy. [name], [name] ([id number]) The final pathology report is back and it shows a metastatic breast carcinoma and an atypical vascular proliferation. 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 She informs me that she is going to [facility] to the [place] for further care. 08/19/16: pt returns today for f/u. she attended the cancer center in [place]. The right arm wound has healed and significant reduction in wound size regarding he right back. She denies systemic s/s of infection. 04/11/17 3 ADMISSION this is a patient  who has been seen in this clinic previously lymphedema and an ulcerated area on her right arm. Apparently biopsy of this area showed possible angiosarcoma. She is also a patient who is  battled metastatic stage IV breast cancer dating back to 2003-5. She now has bilateral mastectomies. Most of her metastasis of been in the lymph nodes and chest. At the time she was in the clinic in the fall of 17 she went to the [facility] [place] in [place] and returned with a much better condition of her arm and right upper back. I have reviewed notes from [facility] oncology Dr. Jana Hakim she has developed over the course of this year multiple cutaneous nodules on the right upper back. Some of these have ulcerated and fact today she has 3 open areas one of which has a necrotic surface and 2 smaller areas. There are other nodules palpable the do not have openings. She is not really symptomatic from this. She wonders about why these have occurred and what can be done about them. I don't really have a good sense of how these areas have responded to chemotherapy however it is clear from reviewing the notes that these are felt to be metastatic lesions. Presumably metastatic breast cancer to skin 04/25/17; 3 open areas last time she was here only one remains today. I think all of this is in the setting of nodular metastatic breast cancer to the skin a note and subcutaneous tissue and the lower back. One open area remains. She sees Dr. Mariea Clonts next week 05/09/17; small open area remains. I think this is all in the setting of nodular metastatic breast cancer to the skin. She asked me today about the nodules and in reviewing her records previously I have not seen these specifically labeled except by a nurse practitioner saw her recently. Nevertheless I think these are metastatic breast cancer lesions. She recently saw Dr. Jana Hakim and stated he didn't mention these. She remains on chemotherapy every 3 weeks 05/24/17; there are no open areas on this patient's upper back however she continues to have multiple cutaneous nodules which I'm assuming are metastatic breast cancer lesions. I never did see a  biopsy to this effect although the nurse practitioner at the cancer center also stated she felt this was metastatic cancer to skin. The patient does not complain of pain but she says the area is itchy Electronic Signature(s) Signed: 05/24/2017 4:47:30 PM By: Linton Ham MD Entered By: Linton Ham on 05/24/2017 15:43:39 [name] ([id number]) -------------------------------------------------------------------------------- Physical Exam Details Patient Name: [name] Date of Service: 05/24/2017 2:00 PM Medical Record Patient Account Number: [id number] Number: Treating RN: Baruch Gouty, RN, BSN, Rita 30-May-1956 [phone] y.o. Other Clinician: Date of Birth/Sex: Female) Treating Jacqualynn Parco Primary Care Provider: [name] Provider/Extender: G Referring Provider: [name] Weeks in Treatment: 6 Constitutional Patient is hypotensive. Appears well. Pulse regular and within target range for patient.Marland Kitchen Respirations regular, non-labored and within target range.. Temperature is normal and within the target range for the patient.Marland Kitchen appears in no distress. Notes Would exam; there is no remaining wounds here. All the areas on top of the multiple firm cutaneous nodules are epithelialized. No evidence of infection Electronic Signature(s) Signed: 05/24/2017 4:47:30 PM By: Linton Ham MD Entered By: Linton Ham on 05/24/2017 15:44:46 [name] ([id number]) -------------------------------------------------------------------------------- Physician Orders Details Patient Name: [name] Date of Service: 05/24/2017 2:00 PM Medical Record Patient Account Number: [id number] Number: Treating RN: Baruch Gouty, RN, BSN, Rita [birth year] [phone] y.o. 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 Z85.3 Personal history of malignant neoplasm of breast C49.11 Malignant neoplasm of connective and soft tissue of right upper limb, including shoulder Discharge From [facility] o Discharge from [facility] Completed Electronic Signature(s) Signed: 05/24/2017 5:54:19 PM By: Regan Lemming BSN, RN Signed: 05/30/2017 5:23:04 PM By: Linton Ham MD Entered By: Regan Lemming on 05/24/2017 17:54:19 [name] ([id number]) -------------------------------------------------------------------------------- Problem List Details Patient Name: [name] Date of Service: 05/24/2017 2:00 PM Medical Record Patient Account Number: [id number] Number: Treating RN: Baruch Gouty, RN, BSN, Rita [birth year] [phone] y.o. 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 04/11/2017 Yes Z85.3 Personal history of malignant neoplasm of breast 04/11/2017 Yes C49.11 Malignant neoplasm of connective and soft tissue of right 04/11/2017 Yes upper limb, including shoulder Inactive Problems Resolved Problems Electronic Signature(s) Signed: 05/24/2017 4:47:30 PM By: Linton Ham MD Entered By: Linton Ham on 05/24/2017 15:42:23 [name] ([id number]) -------------------------------------------------------------------------------- Progress Note Details Patient Name: [name] Date of Service: 05/24/2017 2:00 PM Medical Record Patient Account Number: [id number] Number: Treating RN: Baruch Gouty, RN, BSN, Rita 20-Mar-1956 [phone] y.o. Other Clinician: Date of Birth/Sex: Female) Treating  Nikolus Marczak Primary Care Provider: [name] Provider/Extender: G Referring Provider: [name] Weeks in Treatment: 6 Subjective History of Present Illness (HPI) The following HPI elements were documented for the patient's wound: Location: bilateral arm lymphedema with now skin ulceration of the right upper extremity. Quality: Patient reports experiencing a dull pain to affected area(s). Severity: Patient states wound are getting worse. Duration: Patient has had the wound for > 18 months prior to seeking treatment at the wound center Timing: Pain in wound is Intermittent (comes and goes Context: The wound occurred when the patient . Modifying Factors: Consults to this date include: local care and antibiotics as prescribed by the PCP and the ER. Associated Signs and Symptoms: Patient reports having increase discharge. 61 year old patient known to [name] from a prior visit about a year ago, returns for care. she is known to have stage IV breast cancer, angiosarcoma and has been on treatment with the medical oncologist Dr. Jana Hakim. She has been seeing the hospice nurses earlier and has been continuing with chemotherapy. She was known to have stage IV breast cancer, originally diagnosed in April 2003 and now manifested chiefly by loco-regional nodal disease (neck, chest), without lung, liver, bone, or brain involvement. he has received extensive radiation and chemotherapy after the surgeries. She's always had bilateral lymphedema of her arms but now comes with an ulcerated area on her right upper arm posteriorly. Her medical Oncologist, Dr.Gustav [name], was concerned that what we may be seeing in [place]'s right upper extremity is actually skin spread of her cancer. This needs to be biopsied and she was sending her back to general surgery with that in mind. She placed a referral to the wound clinic . She has been on doxycycline for now. The general surgeon saw her 2 days  ago and did a biopsy and this report is pending. In April 2016 she had a right upper arm venous duplex study  which showed: No evidence of deep vein or superficial thrombosis involving the right upper extremity and left subclavian vein. Moderate edema noted throughout the right upper extremity. 05/01/2015 the patient tells me that her surgeon revealed that her biopsy was negative and that there was no pathology found. Other than that she is doing well and she thinks her edema has gone down a bit. I had done a biopsy for her about a year ago and final report is not back yet but the pathologist Dr. Chrystine Oiler did speak to me on the phone a few days ago and mentioned that he is doing final stains and there may be a mixture of a angiosarcoma and metastatic breast cancer. I have not been able to speak to Dr. Jana Hakim yet but I understand the patient has seen him today and he has discussed the provisional pathology and offered her some chemotherapy. The final pathology report is back and it shows a metastatic breast carcinoma and an atypical vascular proliferation. 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 She informs me that she is going [facility] ([id number][facility] the [place] for further care. 08/19/16: pt returns today for f/u. she attended the cancer center in [place]. The right arm wound has healed and significant reduction in wound size regarding he right back. She denies systemic s/s of infection. 04/11/17 3 ADMISSION this is a patient who has been seen in this clinic previously lymphedema and an ulcerated area on her right arm. Apparently biopsy of this area showed possible angiosarcoma. She is also a patient who is battled metastatic stage IV breast cancer dating back to 2003-5. She now has bilateral mastectomies. Most of her metastasis of been in the lymph nodes and chest. At the time she was in the clinic in the fall of 17 she went to the [facility] [place]  in [place] and returned with a much better condition of her arm and right upper back. I have reviewed notes from [facility] oncology Dr. Jana Hakim she has developed over the course of this year multiple cutaneous nodules on the right upper back. Some of these have ulcerated and fact today she has 3 open areas one of which has a necrotic surface and 2 smaller areas. There are other nodules palpable the do not have openings. She is not really symptomatic from this. She wonders about why these have occurred and what can be done about them. I don't really have a good sense of how these areas have responded to chemotherapy however it is clear from reviewing the notes that these are felt to be metastatic lesions. Presumably metastatic breast cancer to skin 04/25/17; 3 open areas last time she was here only one remains today. I think all of this is in the setting of nodular metastatic breast cancer to the skin a note and subcutaneous tissue and the lower back. One open area remains. She sees Dr. Mariea Clonts next week 05/09/17; small open area remains. I think this is all in the setting of nodular metastatic breast cancer to the skin. She asked me today about the nodules and in reviewing her records previously I have not seen these specifically labeled except by a nurse practitioner saw her recently. Nevertheless I think these are metastatic breast cancer lesions. She recently saw Dr. Jana Hakim and stated he didn't mention these. She remains on chemotherapy every 3 weeks 05/24/17; there are no open areas on this patient's upper back however she continues to have multiple cutaneous nodules which  I'm assuming are metastatic breast cancer lesions. I never did see a biopsy to this effect although the nurse practitioner at the cancer center also stated she felt this was metastatic cancer to skin. The patient does not complain of pain but she says the area is itchy Objective Constitutional Patient is hypotensive.  Appears well. Pulse regular and within target range for patient.Marland Kitchen Respirations regular, non-labored and within target range.. Temperature is normal and within the target range for the patient.Marland Kitchen appears in no distress. Vitals Time Taken: 2:15 PM, Height: 66 in, Temperature: 97.9 F, Pulse: 66 bpm, Respiratory Rate: 16 breaths/min, Blood Pressure: 94/60 mmHg. NASHAY, BRICKLEY ([id number]) General Notes: Would exam; there is no remaining wounds here. All the areas on top of the multiple firm cutaneous nodules are epithelialized. No evidence of infection Integumentary (Hair, Skin) Wound #4 status is Healed - Epithelialized. Original cause of wound was Gradually Appeared. The wound is located on the Back. The wound measures 0cm length x 0cm width x 0cm depth; 0cm^2 area and 0cm^3 volume. There is no tunneling or undermining noted. There is a none present amount of drainage noted. The wound margin is flat and intact. There is no granulation within the wound bed. There is no necrotic tissue within the wound bed. The periwound skin appearance did not exhibit: Callus, Crepitus, Excoriation, Induration, Rash, Scarring, Dry/Scaly, Maceration, Atrophie Blanche, Cyanosis, Ecchymosis, Hemosiderin Staining, Mottled, Pallor, Rubor, Erythema. Periwound temperature was noted as No Abnormality. 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 #2 she hasn't really seem to know the nature of the nodules. I told her to walk with her oncologist at her next visit. #3 with regard to the itching I recommended hydrocortisone OTC Electronic Signature(s) Signed: 05/24/2017 4:47:30 PM By: Linton Ham MD Entered By: Linton Ham on 05/24/2017  15:45:35 [name] ([id number]) -------------------------------------------------------------------------------- SuperBill Details Patient Name: [name] Date of Service: 05/24/2017 Medical Record Patient Account Number: [id number] Number: Treating RN: Baruch Gouty, RN, BSN, Rita [birth year] [phone] y.o. 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 Z85.3 Personal history of malignant neoplasm of breast C49.11 Malignant neoplasm of connective and soft tissue of right upper limb, including shoulder Facility Procedures CPT4 Code: [id number] Description: 06237 - WOUND CARE VISIT-LEV 2 EST PT Modifier: Quantity: 1 Physician Procedures CPT4 Code: 6283151 Description: 76160 - WC PHYS LEVEL 2 - EST PT ICD-10 Description Diagnosis L98.422 Non-pressure chronic ulcer of back with fat laye Modifier: r exposed Quantity: 1 Electronic Signature(s) Signed: 05/24/2017 5:55:19 PM By: Regan Lemming BSN, RN Signed: 05/30/2017 5:23:04 PM By: Linton Ham MD Previous Signature: 05/24/2017 4:47:30 PM Version By: Linton Ham MD Entered By: Regan Lemming on 05/24/2017 17:55:19

## 2017-05-26 ENCOUNTER — Telehealth: Payer: Self-pay

## 2017-05-26 NOTE — Telephone Encounter (Signed)
Pt called b/c wound doctor said [name] should be giving her something to clear up her back. It is not bleeding like it was. She states the abraxane is not working but the perjeta was. The MD in [place] gave her perjeta. She was asking for narcotics b/c her back is hurting. Per note Dr [name] is to prescribe her narcotics. Pt is aware of this. Pt saw [name] on 05/23/17.

## 2017-05-30 ENCOUNTER — Other Ambulatory Visit (HOSPITAL_BASED_OUTPATIENT_CLINIC_OR_DEPARTMENT_OTHER): Payer: Commercial Managed Care - PPO

## 2017-05-30 ENCOUNTER — Ambulatory Visit (HOSPITAL_BASED_OUTPATIENT_CLINIC_OR_DEPARTMENT_OTHER): Payer: Commercial Managed Care - PPO

## 2017-05-30 VITALS — BP 107/51 | HR 72 | Temp 98.6°F | Resp 20

## 2017-05-30 DIAGNOSIS — C50211 Malignant neoplasm of upper-inner quadrant of right female breast: Secondary | ICD-10-CM | POA: Diagnosis not present

## 2017-05-30 DIAGNOSIS — C4911 Malignant neoplasm of connective and soft tissue of right upper limb, including shoulder: Secondary | ICD-10-CM

## 2017-05-30 DIAGNOSIS — Z170 Estrogen receptor positive status [ER+]: Secondary | ICD-10-CM

## 2017-05-30 DIAGNOSIS — Z5111 Encounter for antineoplastic chemotherapy: Secondary | ICD-10-CM

## 2017-05-30 DIAGNOSIS — C778 Secondary and unspecified malignant neoplasm of lymph nodes of multiple regions: Secondary | ICD-10-CM | POA: Diagnosis not present

## 2017-05-30 DIAGNOSIS — Z5112 Encounter for antineoplastic immunotherapy: Secondary | ICD-10-CM

## 2017-05-30 LAB — COMPREHENSIVE METABOLIC PANEL
ALT: 13 U/L (ref 0–55)
AST: 33 U/L (ref 5–34)
Albumin: 3.5 g/dL (ref 3.5–5.0)
Alkaline Phosphatase: 101 U/L (ref 40–150)
Anion Gap: 10 mEq/L (ref 3–11)
BUN: 14.2 mg/dL (ref 7.0–26.0)
CO2: 31 mEq/L — ABNORMAL HIGH (ref 22–29)
Calcium: 9.3 mg/dL (ref 8.4–10.4)
Chloride: 100 mEq/L (ref 98–109)
Creatinine: 0.9 mg/dL (ref 0.6–1.1)
EGFR: 84 mL/min/{1.73_m2} — ABNORMAL LOW (ref >90)
Glucose: 101 mg/dl (ref 70–140)
Potassium: 3.7 mEq/L (ref 3.5–5.1)
Sodium: 140 mEq/L (ref 136–145)
Total Bilirubin: 0.23 mg/dL (ref 0.20–1.20)
Total Protein: 7.3 g/dL (ref 6.4–8.3)

## 2017-05-30 LAB — CBC WITH DIFFERENTIAL/PLATELET
BASO%: 0.9 % (ref 0.0–2.0)
Basophils Absolute: 0.1 10*3/uL (ref 0.0–0.1)
EOS%: 11.4 % — ABNORMAL HIGH (ref 0.0–7.0)
Eosinophils Absolute: 0.9 10*3/uL — ABNORMAL HIGH (ref 0.0–0.5)
HCT: 35.6 % (ref 34.8–46.6)
HGB: 11.4 g/dL — ABNORMAL LOW (ref 11.6–15.9)
LYMPH%: 24.7 % (ref 14.0–49.7)
MCH: 27.6 pg (ref 25.1–34.0)
MCHC: 32.2 g/dL (ref 31.5–36.0)
MCV: 85.6 fL (ref 79.5–101.0)
MONO#: 0.9 10*3/uL (ref 0.1–0.9)
MONO%: 11.8 % (ref 0.0–14.0)
NEUT#: 3.9 10*3/uL (ref 1.5–6.5)
NEUT%: 51.2 % (ref 38.4–76.8)
Platelets: 194 10*3/uL (ref 145–400)
RBC: 4.15 10*6/uL (ref 3.70–5.45)
RDW: 15.8 % — ABNORMAL HIGH (ref 11.2–14.5)
WBC: 7.6 10*3/uL (ref 3.9–10.3)
lymph#: 1.9 10*3/uL (ref 0.9–3.3)

## 2017-05-30 MED ORDER — PERTUZUMAB CHEMO INJECTION 420 MG/14ML
420.0000 mg | Freq: Once | INTRAVENOUS | Status: DC
  Filled 2017-05-30: qty 14

## 2017-05-30 MED ORDER — PROCHLORPERAZINE MALEATE 10 MG PO TABS
10.0000 mg | ORAL_TABLET | Freq: Once | ORAL | Status: AC
  Administered 2017-05-30: 10 mg via ORAL

## 2017-05-30 MED ORDER — SODIUM CHLORIDE 0.9 % IV SOLN
Freq: Once | INTRAVENOUS | Status: AC
  Administered 2017-05-30: 11:00:00 via INTRAVENOUS

## 2017-05-30 MED ORDER — HEPARIN SOD (PORK) LOCK FLUSH 100 UNIT/ML IV SOLN
500.0000 [IU] | Freq: Once | INTRAVENOUS | Status: AC | PRN
  Administered 2017-05-30: 500 [IU]
  Filled 2017-05-30: qty 5

## 2017-05-30 MED ORDER — DIPHENHYDRAMINE HCL 25 MG PO CAPS
ORAL_CAPSULE | ORAL | Status: AC
  Filled 2017-05-30: qty 1

## 2017-05-30 MED ORDER — ACETAMINOPHEN 325 MG PO TABS
ORAL_TABLET | ORAL | Status: AC
  Filled 2017-05-30: qty 2

## 2017-05-30 MED ORDER — DIPHENHYDRAMINE HCL 25 MG PO CAPS
25.0000 mg | ORAL_CAPSULE | Freq: Once | ORAL | Status: AC
  Administered 2017-05-30: 25 mg via ORAL

## 2017-05-30 MED ORDER — SODIUM CHLORIDE 0.9 % IV SOLN
210.0000 mg | Freq: Once | INTRAVENOUS | Status: AC
  Administered 2017-05-30: 210 mg via INTRAVENOUS
  Filled 2017-05-30: qty 7

## 2017-05-30 MED ORDER — ACETAMINOPHEN 325 MG PO TABS
650.0000 mg | ORAL_TABLET | Freq: Once | ORAL | Status: AC
  Administered 2017-05-30: 650 mg via ORAL

## 2017-05-30 MED ORDER — SODIUM CHLORIDE 0.9% FLUSH
10.0000 mL | INTRAVENOUS | Status: DC | PRN
  Administered 2017-05-30: 10 mL
  Filled 2017-05-30: qty 10

## 2017-05-30 MED ORDER — PROCHLORPERAZINE MALEATE 10 MG PO TABS
ORAL_TABLET | ORAL | Status: AC
  Filled 2017-05-30: qty 1

## 2017-05-30 MED ORDER — PACLITAXEL PROTEIN-BOUND CHEMO INJECTION 100 MG
125.0000 mg/m2 | Freq: Once | INTRAVENOUS | Status: AC
  Administered 2017-05-30: 250 mg via INTRAVENOUS
  Filled 2017-05-30: qty 50

## 2017-05-30 MED ORDER — SODIUM CHLORIDE 0.9 % IV SOLN
300.0000 mg | Freq: Once | INTRAVENOUS | Status: AC
  Administered 2017-05-30: 300 mg via INTRAVENOUS
  Filled 2017-05-30: qty 14.29

## 2017-05-30 NOTE — Patient Instructions (Signed)
[facility] Discharge Instructions for Patients Receiving Chemotherapy  Today you received the following chemotherapy agents herceptin/perjeta/abraxane  To help prevent nausea and vomiting after your treatment, we encourage you to take your nausea medication as directed  If you develop nausea and vomiting that is not controlled by your nausea medication, call the clinic.   BELOW ARE SYMPTOMS THAT SHOULD BE REPORTED IMMEDIATELY:  *FEVER GREATER THAN 100.5 F  *CHILLS WITH OR WITHOUT FEVER  NAUSEA AND VOMITING THAT IS NOT CONTROLLED WITH YOUR NAUSEA MEDICATION  *UNUSUAL SHORTNESS OF BREATH  *UNUSUAL BRUISING OR BLEEDING  TENDERNESS IN MOUTH AND THROAT WITH OR WITHOUT PRESENCE OF ULCERS  *URINARY PROBLEMS  *BOWEL PROBLEMS  UNUSUAL RASH Items with * indicate a potential emergency and should be followed up as soon as possible.  Feel free to call the clinic you have any questions or concerns. The clinic phone number is [phone].

## 2017-05-30 NOTE — Progress Notes (Signed)
Pt stated pain in back is 10/10.  Pt slurring speech and seems slightly confused.  Pt stated she took percocet, xanax, and methadone this morning.  This RN did not feel pt should receive more pain medication due to fact pt seemed slightly sedated already.  Pt falling asleep easily, pt seems comfortable, VSS.  Pt adjusted in the chair.

## 2017-06-02 ENCOUNTER — Telehealth: Payer: Self-pay | Admitting: *Deleted

## 2017-06-02 DIAGNOSIS — Z79891 Long term (current) use of opiate analgesic: Secondary | ICD-10-CM | POA: Diagnosis not present

## 2017-06-02 NOTE — Telephone Encounter (Signed)
This RN received request at [phone] to contact Lattie Haw at Dr [name] office - urgently regarding this patient.  Call returned immediately with call being placed on hold -

## 2017-06-06 IMAGING — CT CT ANGIO CHEST
2 of 6 series · 17 of 36 positions shown · IV contrast (ISOVUE 370)
Comparison: 03/19/2015

CLINICAL DATA: Cough. Diagnosis of pneumonia on 01/04/2017. No
relief on medications. Progressive shortness of breath.

EXAM:
CT ANGIOGRAPHY CHEST WITH CONTRAST
TECHNIQUE: Multidetector CT imaging of the chest was performed using the
standard protocol during bolus administration of intravenous
contrast. Multiplanar CT image reconstructions and MIPs were
obtained to evaluate the vascular anatomy.
CONTRAST:  65 mL Isovue 370

[Series 5: coronal mpr · coronal · 0.45mm/px · 1 of 150 slices shown]
[im 75/150  mediastinal]
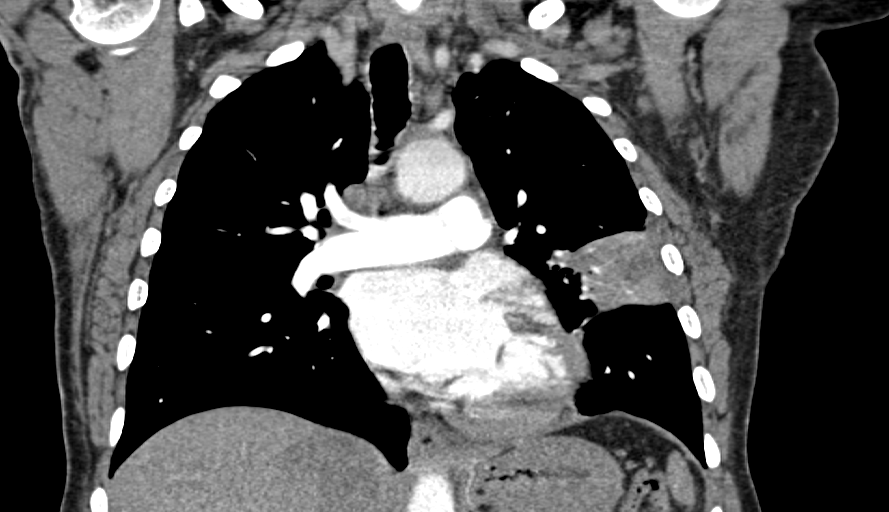

[Series 10: thins for pacs · axial · 0.74mm/px · z∈[+90,+299]mm · 16 of 233 slices shown]
[im 12/233  lung]
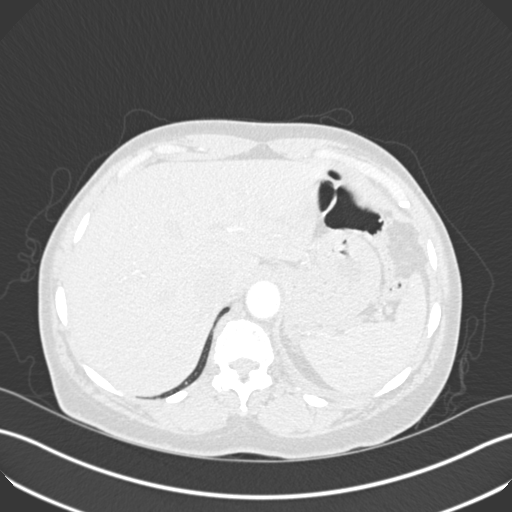
[im 24/233  mediastinal]
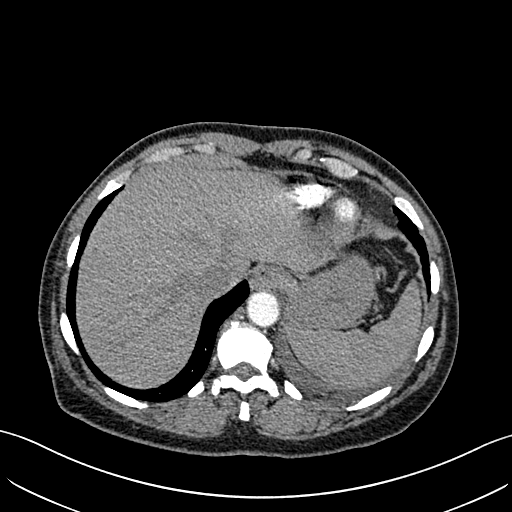
[im 35/233  lung]
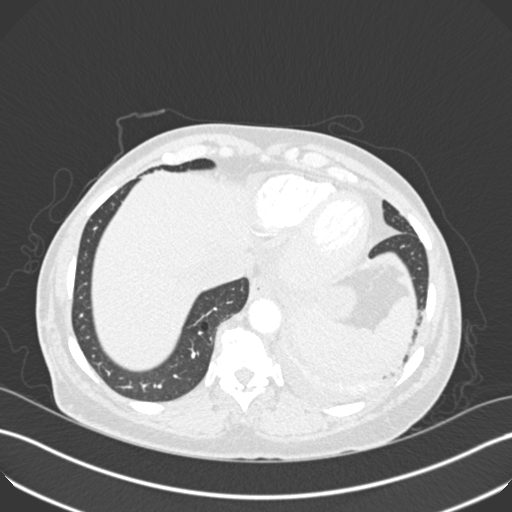
[im 59/233  mediastinal]
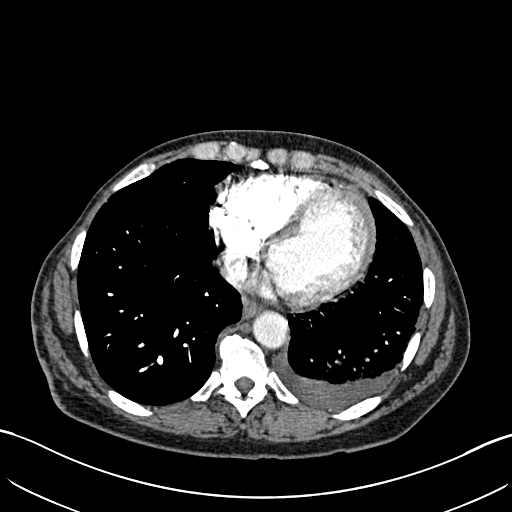
[im 70/233  lung]
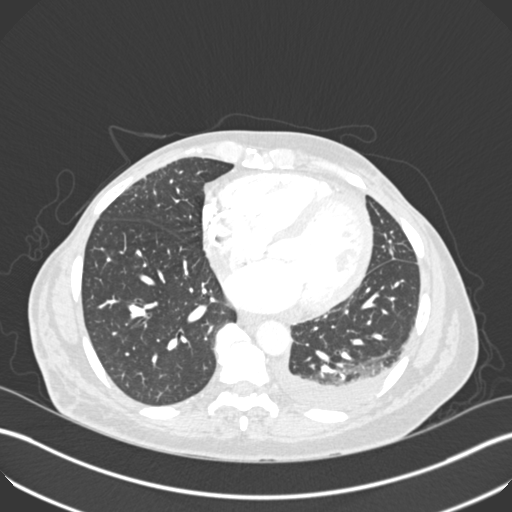
[im 82/233  mediastinal]
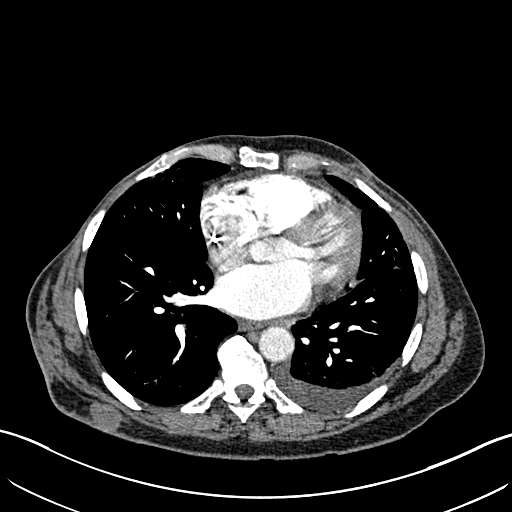
[im 93/233  lung]
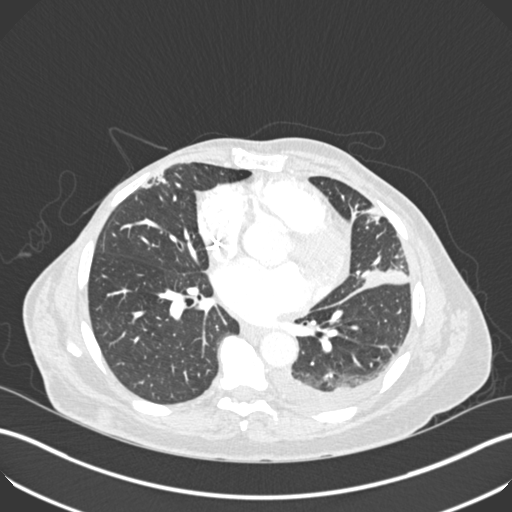
[im 105/233  mediastinal]
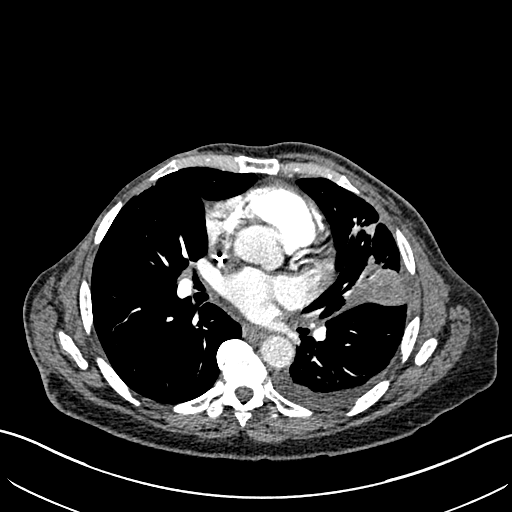
[im 128/233  lung]
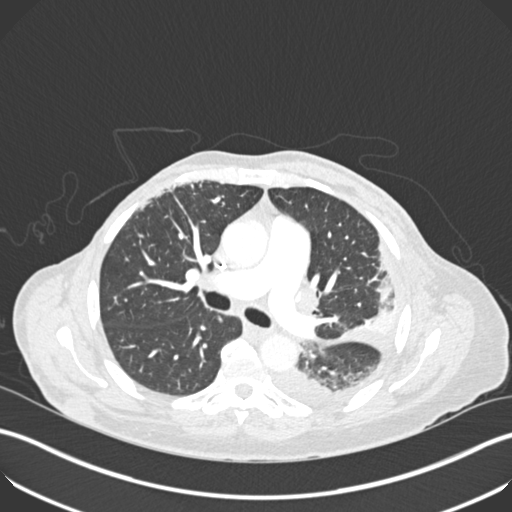
[im 140/233  mediastinal]
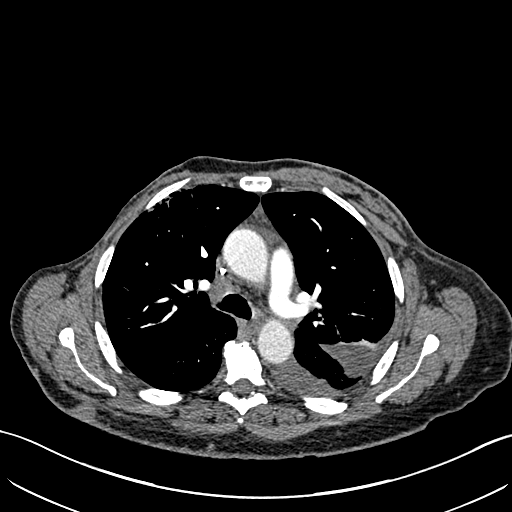
[im 151/233  lung]
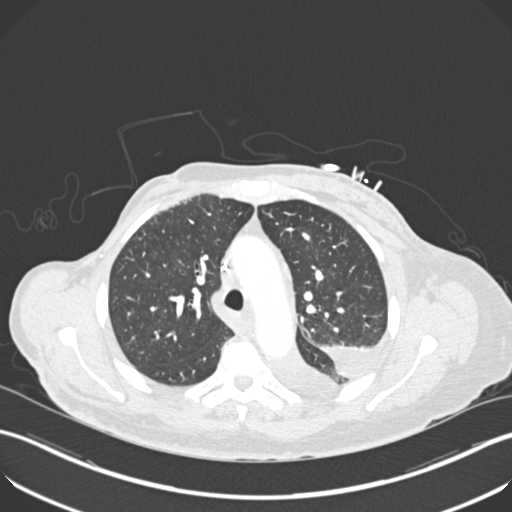
[im 163/233  mediastinal]
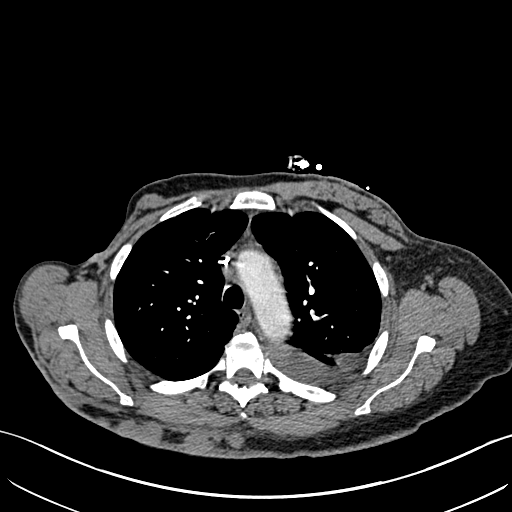
[im 175/233  lung]
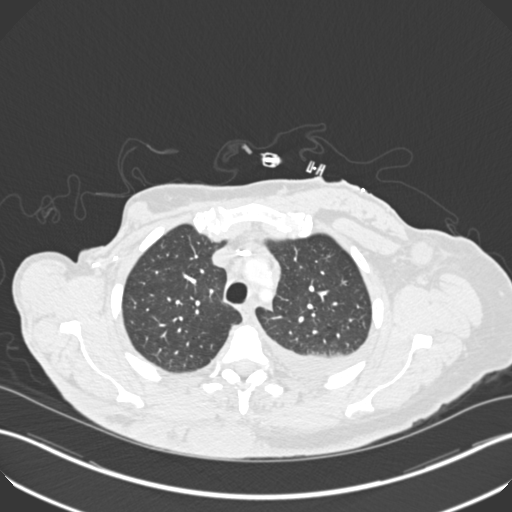
[im 198/233  mediastinal]
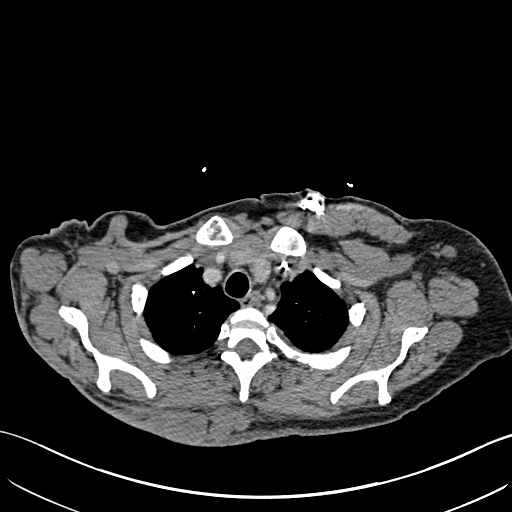
[im 209/233  lung]
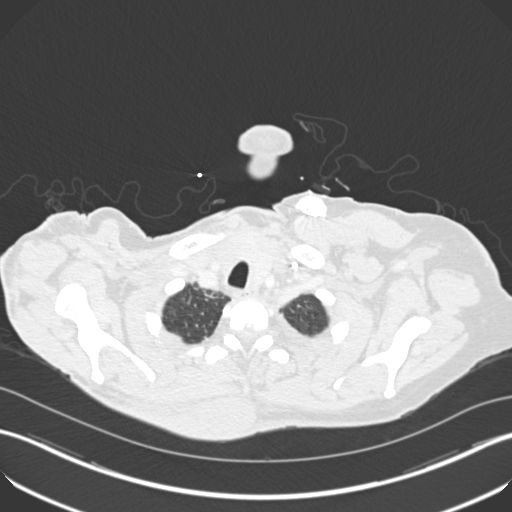
[im 221/233  mediastinal]
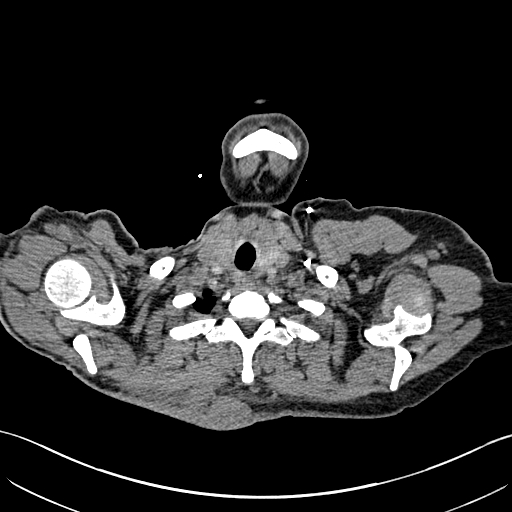

[17 of 36 positions shown; findings below may reference images not displayed]

FINDINGS: Cardiovascular: Satisfactory opacification of the pulmonary arteries
to the segmental level. No evidence of pulmonary embolism. Normal
heart size. No pericardial effusion. Normal caliber thoracic aorta.
No aortic dissection. Great vessel origins are patent. Scattered
calcifications in the aorta. Coronary artery calcifications.

Mediastinum/Nodes: No enlarged mediastinal, hilar, or axillary lymph
nodes. Thyroid gland, trachea, and esophagus demonstrate no
significant findings. Left-sided Infuse-A-Port with tip in the
distal SVC.

Lungs/Pleura: Small left pleural effusion with left basilar
atelectasis or consolidation. Fluid in the left major fissure. Focal
consolidation in the left lingula. No obstructing lesions are
present. This likely represents pneumonia. Focal fibrosis
demonstrated in the anterior right lung is probably postradiation
change. Right lung is otherwise clear. 5 mm nodule in the right lung
base is unchanged since previous study. 3 mm nodule seen previously
in the central left lung is not identified due to consolidation in
this area today. No new pulmonary nodules are demonstrated.

Upper Abdomen: No acute abnormality.

Musculoskeletal: Diffuse degenerative change throughout the thoracic
spine. No destructive bone lesions. Postoperative bilateral
mastectomies. Large subcutaneous soft tissue nodules in the
subcutaneous fat over the back, mostly on the right side. These
lesions have developed since the previous study. Largest is in the
upper back to the right of midline and measures about 2.9 x 4.9 cm.
Multiple additional smaller lesions are demonstrated. Soft tissue
nodules also demonstrated in the left axilla measuring up to 2 cm
diameter, new since prior study. Appearances are suspicious for
developing metastatic disease, either in lymph nodes and/or skin
nodules.

Review of the MIP images confirms the above findings.
IMPRESSION: No evidence of significant pulmonary embolus. Consolidation in the
left lung with small left pleural effusion, likely due to pneumonia.
Radiation change in the anterior right lung. Interval development of
multiple soft tissue nodules in the subcutaneous tissues over the
right side of the back and in the left axilla. These changes are
worrisome for metastatic disease either from soft tissue nodules
or/and lymph nodes.

## 2017-06-06 IMAGING — CR DG CHEST 2V
2 series · 2 of 2 positions shown · non-contrast
Comparison: 01/04/2017

CLINICAL DATA: Cough.  Diagnosis with pneumonia on [DATE].

EXAM:
CHEST  2 VIEW

[w chest pa]
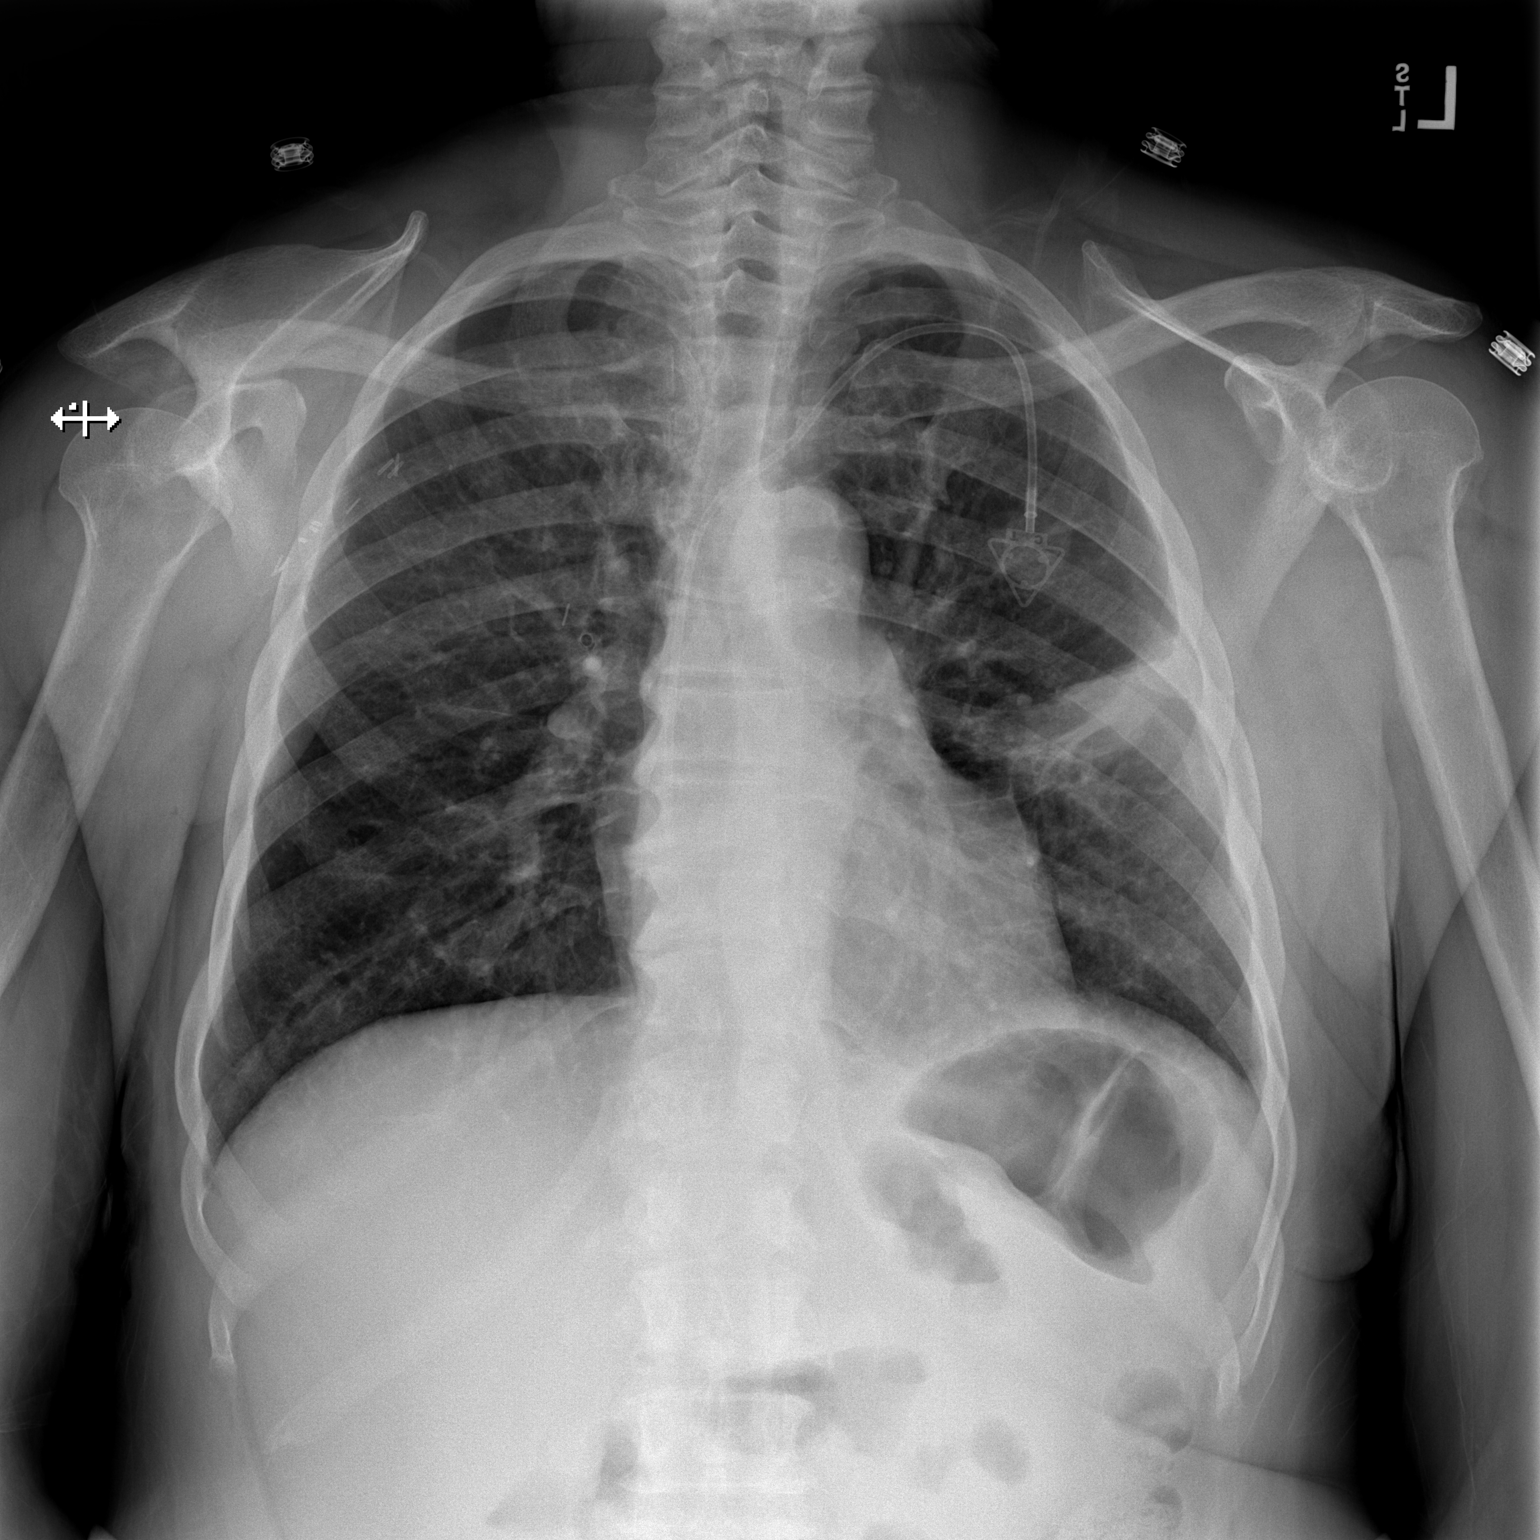

[w chest lat]
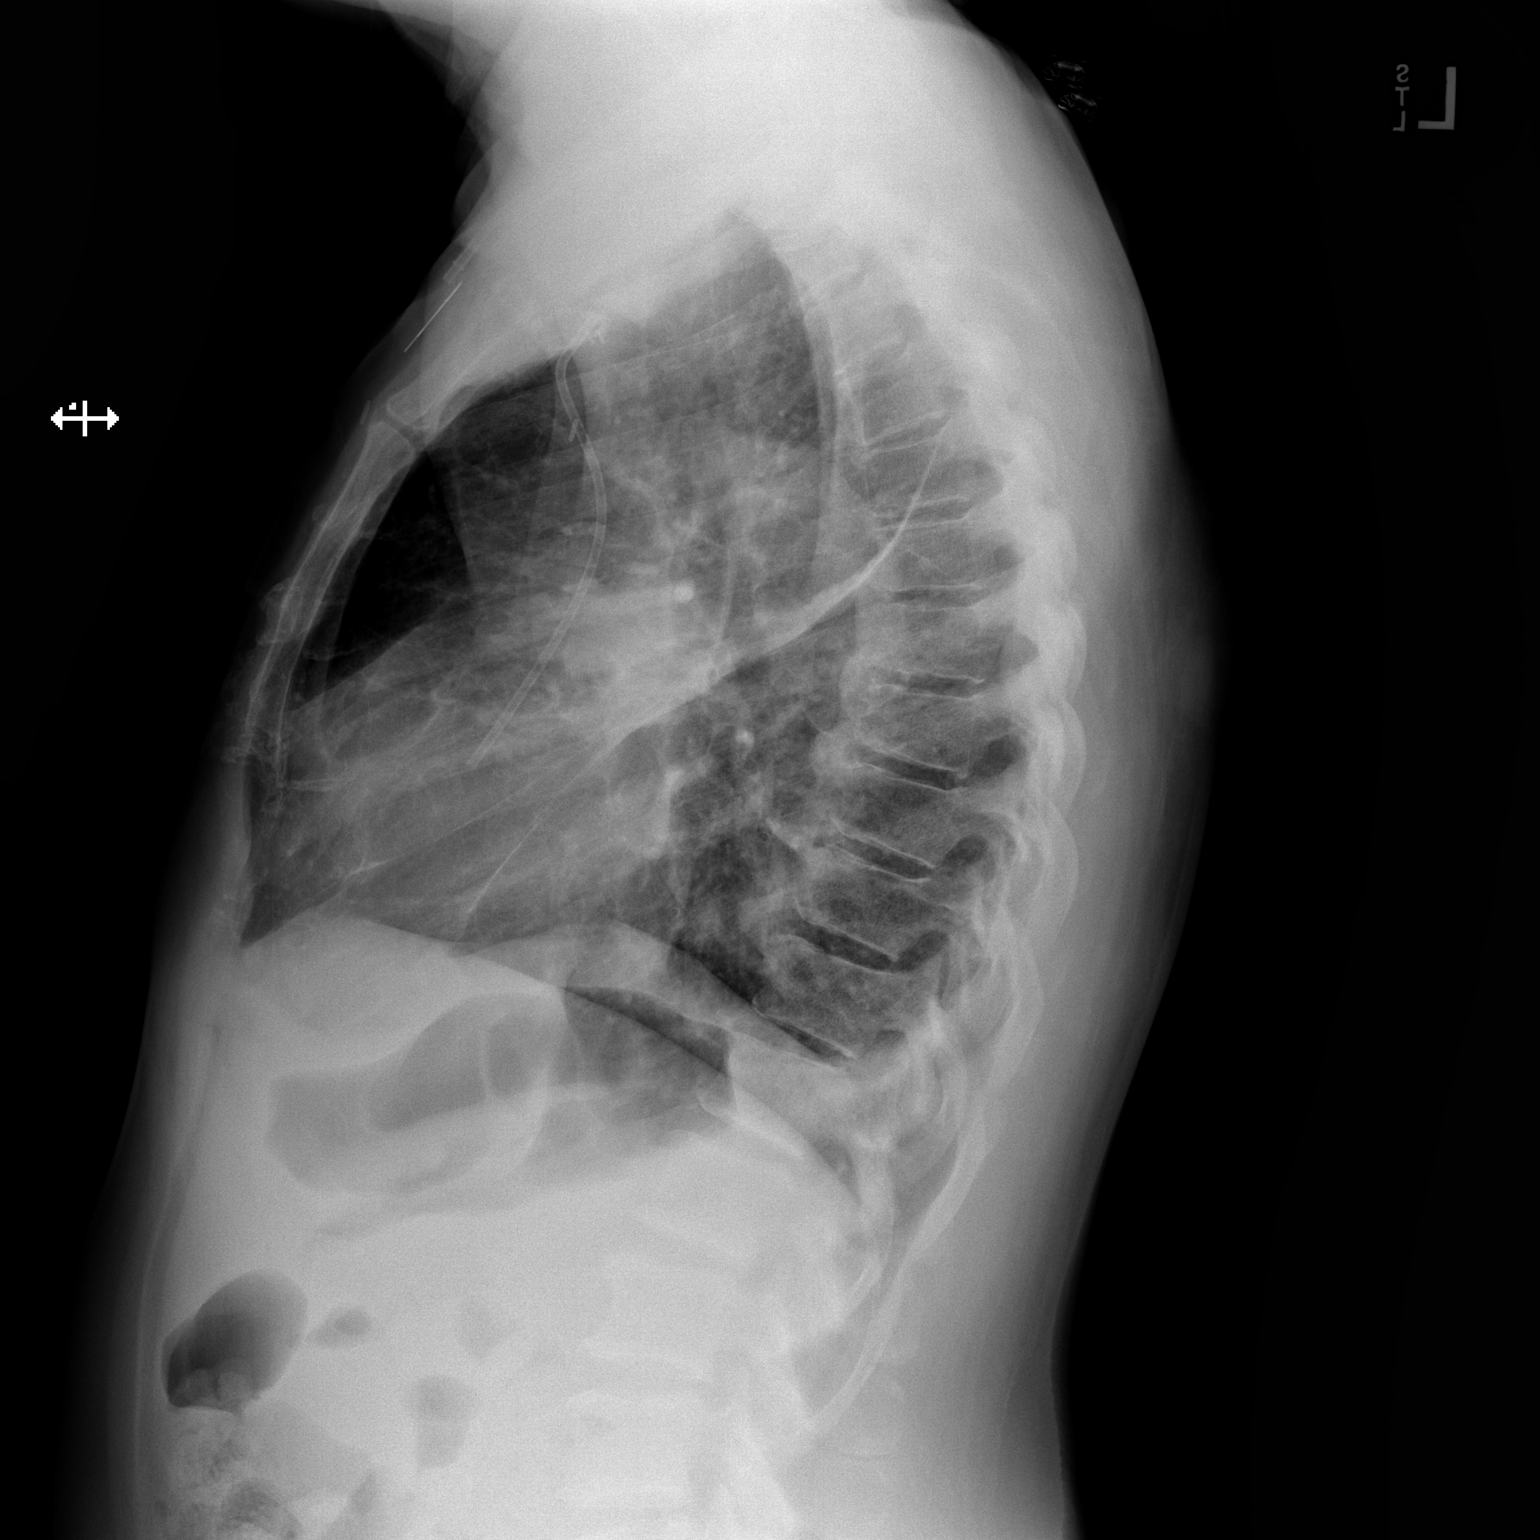

[2 of 2 positions shown; findings below may reference images not displayed]

FINDINGS: Injectable catheter in stable position with tip overlying the
expected location of the cavoatrial junction.

Cardiomediastinal silhouette is normal. Mediastinal contours appear
intact.

There is no evidence of pneumothorax. There is persistent opacity
within the perifissural portion of the left upper lobe with
associated fissure wall thickening. There is also prominence of the
left hilum.

Osseous structures are without acute abnormality. Postsurgical
changes in the right chest wall. Soft tissues are grossly normal.
IMPRESSION: Persistent opacity within the inferior portion of the left upper
lobe with associated perifissural thickening. Left perihilar
thickening.

This may represent an area of airspace consolidation, a pseudo tumor
or a pulmonary mass.

Further evaluation with chest CT may be considered.

## 2017-06-08 ENCOUNTER — Ambulatory Visit: Payer: Commercial Managed Care - PPO

## 2017-06-08 ENCOUNTER — Other Ambulatory Visit: Payer: Commercial Managed Care - PPO

## 2017-06-08 IMAGING — CR DG CHEST 2V
2 series · 2 of 2 positions shown · non-contrast
Comparison: Radiographs and CT scan January 21, 2017.

CLINICAL DATA: Cough.

EXAM:
CHEST  2 VIEW

[w chest pa]
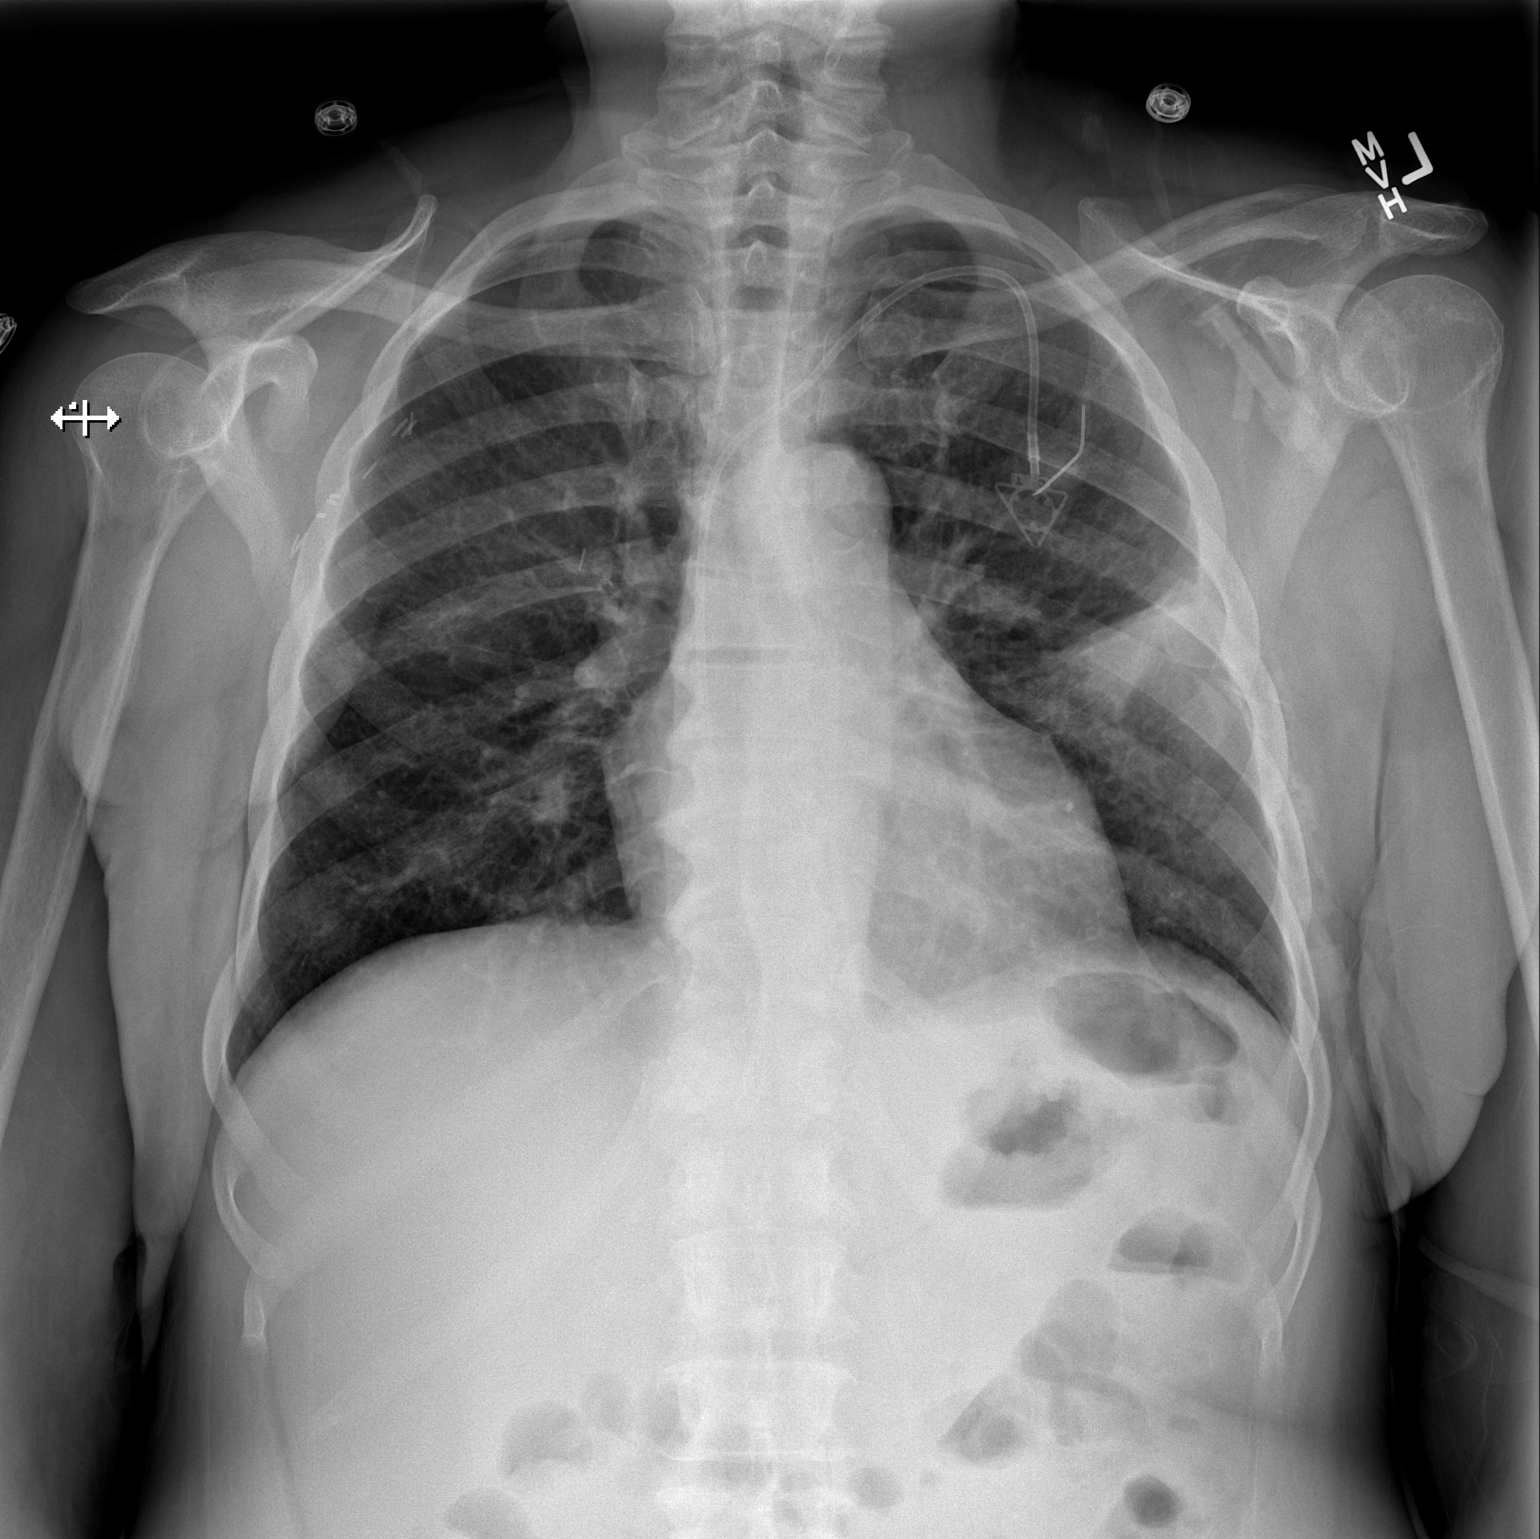

[w chest lat]
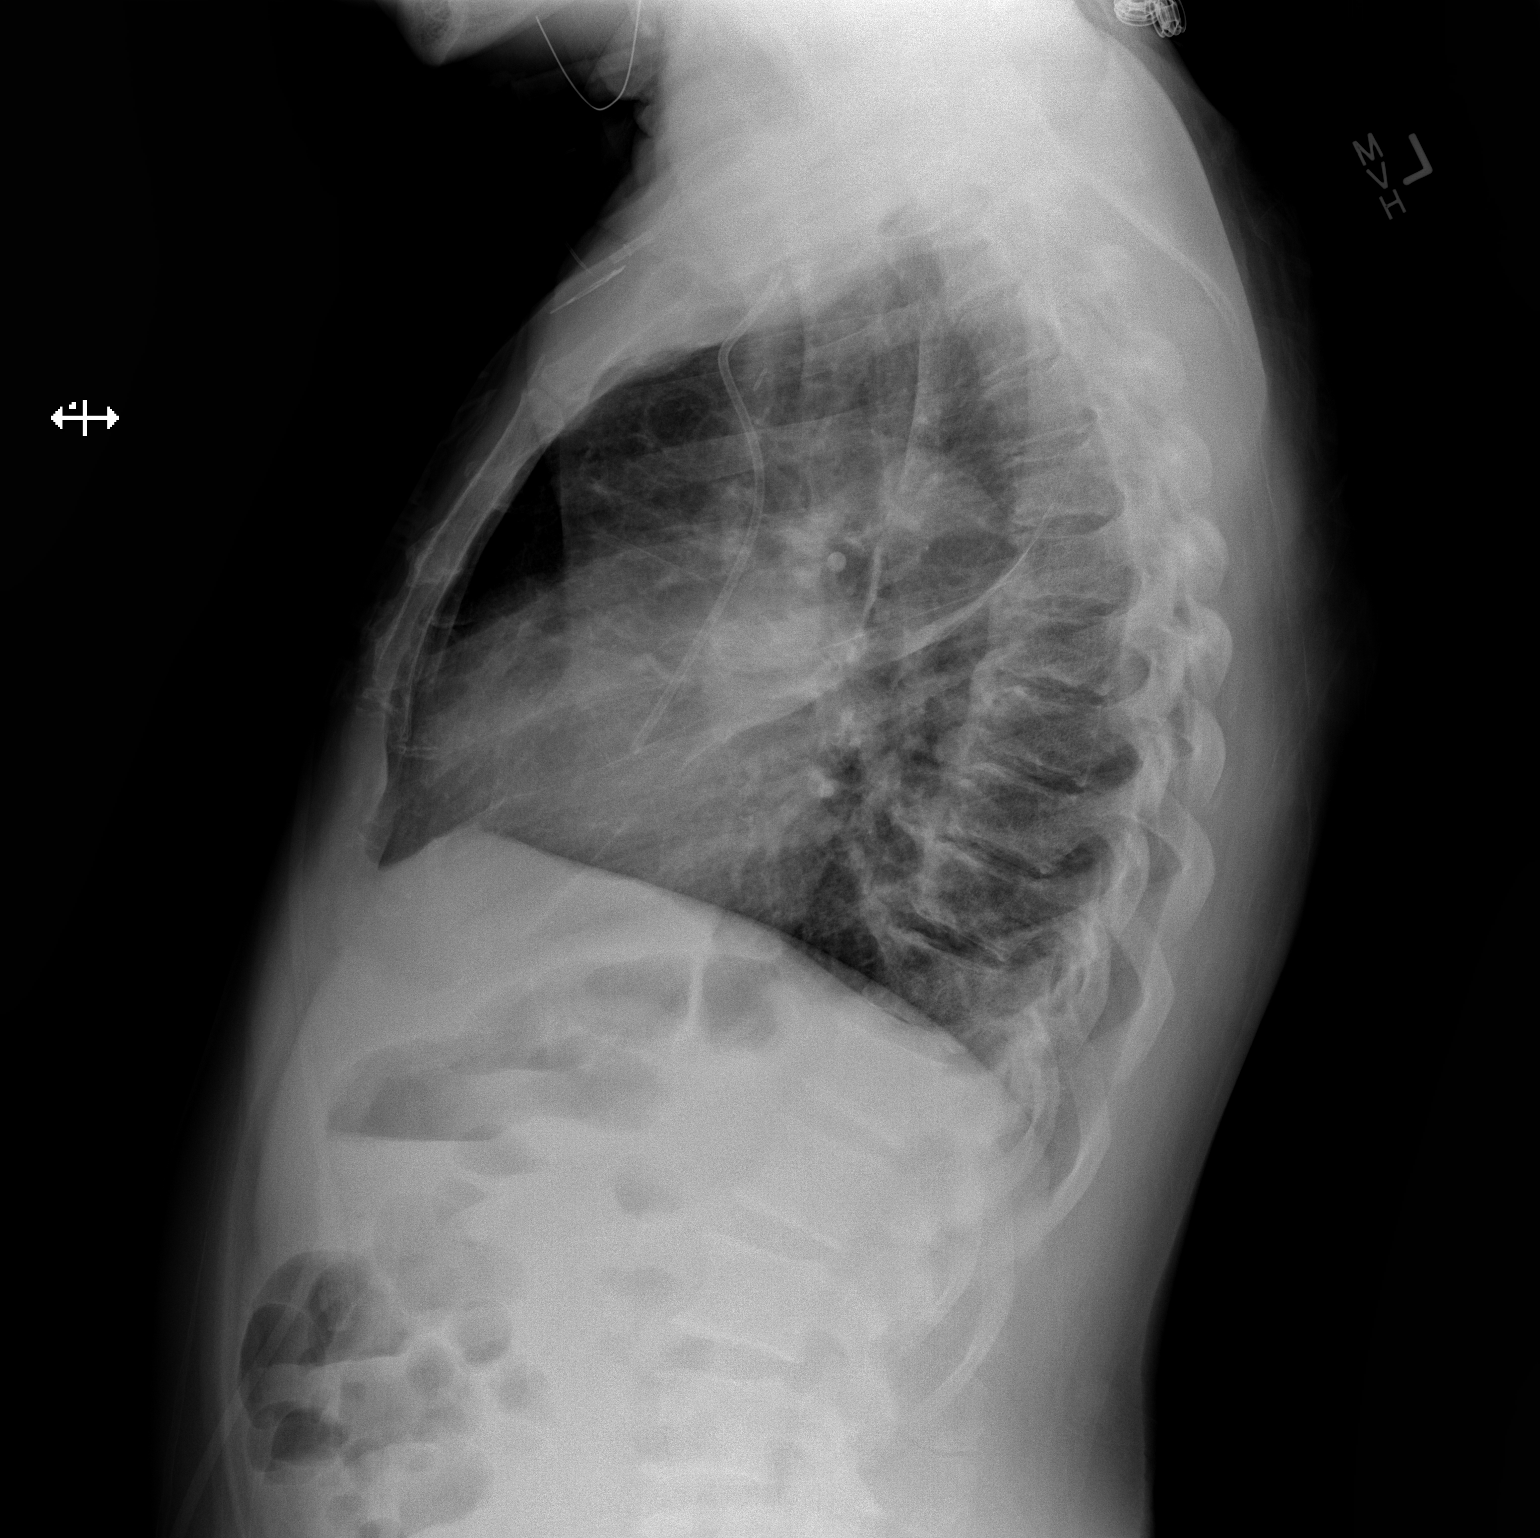

[2 of 2 positions shown; findings below may reference images not displayed]

FINDINGS: The heart size and mediastinal contours are within normal limits.
Left-sided pacemaker is unchanged in position. No pneumothorax or
pleural effusion is noted. Right lung is clear. Stable left midlung
opacity is noted as described on prior studies. The visualized
skeletal structures are unremarkable.
IMPRESSION: Stable left midlung opacity is noted as described on prior CT scan
and radiographs.

## 2017-06-12 ENCOUNTER — Telehealth: Payer: Self-pay | Admitting: Oncology

## 2017-06-12 NOTE — Telephone Encounter (Signed)
Patient called to cancel her appointment 8/7 due to having to go out of town to see about her son.  She stated that she had to go to [place] to see about him and she needs to leave this afternoon 8/6.  She has lab flush and infusion

## 2017-06-13 ENCOUNTER — Other Ambulatory Visit: Payer: Commercial Managed Care - PPO

## 2017-06-13 ENCOUNTER — Ambulatory Visit: Payer: Commercial Managed Care - PPO

## 2017-06-16 ENCOUNTER — Other Ambulatory Visit: Payer: Self-pay

## 2017-06-16 ENCOUNTER — Telehealth: Payer: Self-pay

## 2017-06-16 MED ORDER — TRIAMTERENE-HCTZ 37.5-25 MG PO CAPS: 1.0000 | ORAL_CAPSULE | Freq: Every morning | ORAL | 3 refills | Status: DC

## 2017-06-16 NOTE — Telephone Encounter (Signed)
Inez Catalina from [facility] called in regards to referral for services placed Dec 2017.  Inez Catalina states she has left VM with pt in Jan and again this month with no return call from pt.  This RN informed Inez Catalina that at this time Palliative Care services can be discontinued and reevaluated for need at a later time.

## 2017-06-22 ENCOUNTER — Other Ambulatory Visit: Payer: Self-pay | Admitting: *Deleted

## 2017-06-22 MED ORDER — METHADONE HCL 10 MG PO TABS: 10.0000 mg | ORAL_TABLET | Freq: Three times a day (TID) | ORAL | 0 refills | Status: DC

## 2017-06-22 MED ORDER — METHADONE HCL 5 MG PO TABS: 5.0000 mg | ORAL_TABLET | Freq: Three times a day (TID) | ORAL | 0 refills | Status: DC

## 2017-06-29 ENCOUNTER — Ambulatory Visit: Payer: Commercial Managed Care - PPO | Admitting: Oncology

## 2017-06-29 ENCOUNTER — Ambulatory Visit: Payer: Commercial Managed Care - PPO

## 2017-06-29 ENCOUNTER — Other Ambulatory Visit: Payer: Commercial Managed Care - PPO

## 2017-06-30 ENCOUNTER — Other Ambulatory Visit: Payer: Self-pay | Admitting: *Deleted

## 2017-06-30 MED ORDER — METHYLPREDNISOLONE 4 MG PO TBPK: ORAL_TABLET | ORAL | 0 refills | Status: DC

## 2017-06-30 NOTE — Telephone Encounter (Signed)
This RN spoke with pt per her call stating onset of rash since returning home from the beach.  She states she returned 8/21 and rash occurred that evening.  Rash is limited to her chest, back and arms with itching.  She contacted primary MD who advised her to contact this office.  Per discussion and review with MD - prescription for steroid obtained and sent to the patient's pharmacy.  [name] is aware and will obtain as well as use of benadryl cream for comfort.

## 2017-07-04 ENCOUNTER — Ambulatory Visit (HOSPITAL_BASED_OUTPATIENT_CLINIC_OR_DEPARTMENT_OTHER): Payer: Commercial Managed Care - PPO | Admitting: Oncology

## 2017-07-04 ENCOUNTER — Telehealth: Payer: Self-pay

## 2017-07-04 ENCOUNTER — Other Ambulatory Visit (HOSPITAL_BASED_OUTPATIENT_CLINIC_OR_DEPARTMENT_OTHER): Payer: Commercial Managed Care - PPO

## 2017-07-04 ENCOUNTER — Ambulatory Visit (HOSPITAL_BASED_OUTPATIENT_CLINIC_OR_DEPARTMENT_OTHER): Payer: Commercial Managed Care - PPO

## 2017-07-04 ENCOUNTER — Ambulatory Visit: Payer: Commercial Managed Care - PPO

## 2017-07-04 VITALS — BP 129/64 | HR 63 | Temp 98.4°F | Resp 18

## 2017-07-04 VITALS — BP 126/59 | HR 67 | Temp 98.4°F | Resp 17 | Ht 66.0 in | Wt 180.1 lb

## 2017-07-04 DIAGNOSIS — C50211 Malignant neoplasm of upper-inner quadrant of right female breast: Secondary | ICD-10-CM | POA: Diagnosis not present

## 2017-07-04 DIAGNOSIS — C4911 Malignant neoplasm of connective and soft tissue of right upper limb, including shoulder: Secondary | ICD-10-CM

## 2017-07-04 DIAGNOSIS — C778 Secondary and unspecified malignant neoplasm of lymph nodes of multiple regions: Secondary | ICD-10-CM

## 2017-07-04 DIAGNOSIS — Z171 Estrogen receptor negative status [ER-]: Secondary | ICD-10-CM | POA: Diagnosis not present

## 2017-07-04 DIAGNOSIS — G620 Drug-induced polyneuropathy: Secondary | ICD-10-CM | POA: Diagnosis not present

## 2017-07-04 DIAGNOSIS — Z5111 Encounter for antineoplastic chemotherapy: Secondary | ICD-10-CM | POA: Diagnosis not present

## 2017-07-04 DIAGNOSIS — Z170 Estrogen receptor positive status [ER+]: Secondary | ICD-10-CM

## 2017-07-04 LAB — CBC WITH DIFFERENTIAL/PLATELET
BASO%: 0.8 % (ref 0.0–2.0)
Basophils Absolute: 0.1 10*3/uL (ref 0.0–0.1)
EOS%: 6.7 % (ref 0.0–7.0)
Eosinophils Absolute: 0.5 10*3/uL (ref 0.0–0.5)
HCT: 37.7 % (ref 34.8–46.6)
HGB: 12.2 g/dL (ref 11.6–15.9)
LYMPH%: 20.3 % (ref 14.0–49.7)
MCH: 27.8 pg (ref 25.1–34.0)
MCHC: 32.3 g/dL (ref 31.5–36.0)
MCV: 86 fL (ref 79.5–101.0)
MONO#: 0.6 10*3/uL (ref 0.1–0.9)
MONO%: 9.5 % (ref 0.0–14.0)
NEUT#: 4.3 10*3/uL (ref 1.5–6.5)
NEUT%: 62.7 % (ref 38.4–76.8)
Platelets: 284 10*3/uL (ref 145–400)
RBC: 4.39 10*6/uL (ref 3.70–5.45)
RDW: 16.5 % — ABNORMAL HIGH (ref 11.2–14.5)
WBC: 6.8 10*3/uL (ref 3.9–10.3)
lymph#: 1.4 10*3/uL (ref 0.9–3.3)

## 2017-07-04 LAB — COMPREHENSIVE METABOLIC PANEL
ALT: 12 U/L (ref 0–55)
AST: 22 U/L (ref 5–34)
Albumin: 3.6 g/dL (ref 3.5–5.0)
Alkaline Phosphatase: 102 U/L (ref 40–150)
Anion Gap: 7 mEq/L (ref 3–11)
BUN: 10.6 mg/dL (ref 7.0–26.0)
CO2: 33 mEq/L — ABNORMAL HIGH (ref 22–29)
Calcium: 9.6 mg/dL (ref 8.4–10.4)
Chloride: 98 mEq/L (ref 98–109)
Creatinine: 0.9 mg/dL (ref 0.6–1.1)
EGFR: 85 mL/min/{1.73_m2} — ABNORMAL LOW (ref >90)
Glucose: 102 mg/dl (ref 70–140)
Potassium: 3.6 mEq/L (ref 3.5–5.1)
Sodium: 138 mEq/L (ref 136–145)
Total Bilirubin: 0.35 mg/dL (ref 0.20–1.20)
Total Protein: 7.7 g/dL (ref 6.4–8.3)

## 2017-07-04 MED ORDER — ACETAMINOPHEN 325 MG PO TABS
ORAL_TABLET | ORAL | Status: AC
  Filled 2017-07-04: qty 2

## 2017-07-04 MED ORDER — SODIUM CHLORIDE 0.9 % IV SOLN
Freq: Once | INTRAVENOUS | Status: AC
  Administered 2017-07-04: 11:00:00 via INTRAVENOUS

## 2017-07-04 MED ORDER — TRASTUZUMAB CHEMO 150 MG IV SOLR
450.0000 mg | Freq: Once | INTRAVENOUS | Status: AC
  Administered 2017-07-04: 450 mg via INTRAVENOUS
  Filled 2017-07-04: qty 21.43

## 2017-07-04 MED ORDER — PROCHLORPERAZINE MALEATE 10 MG PO TABS
ORAL_TABLET | ORAL | Status: AC
  Filled 2017-07-04: qty 1

## 2017-07-04 MED ORDER — ACETAMINOPHEN 325 MG PO TABS
650.0000 mg | ORAL_TABLET | Freq: Once | ORAL | Status: AC
  Administered 2017-07-04: 650 mg via ORAL

## 2017-07-04 MED ORDER — DIPHENHYDRAMINE HCL 25 MG PO CAPS
25.0000 mg | ORAL_CAPSULE | Freq: Once | ORAL | Status: AC
  Administered 2017-07-04: 25 mg via ORAL

## 2017-07-04 MED ORDER — PROCHLORPERAZINE MALEATE 10 MG PO TABS: 10.0000 mg | ORAL_TABLET | Freq: Once | ORAL | Status: DC

## 2017-07-04 MED ORDER — HEPARIN SOD (PORK) LOCK FLUSH 100 UNIT/ML IV SOLN
500.0000 [IU] | Freq: Once | INTRAVENOUS | Status: AC | PRN
  Administered 2017-07-04: 500 [IU]
  Filled 2017-07-04: qty 5

## 2017-07-04 MED ORDER — SODIUM CHLORIDE 0.9 % IV SOLN: 1000.0000 mg/m2 | Freq: Once | INTRAVENOUS | Status: DC

## 2017-07-04 MED ORDER — SODIUM CHLORIDE 0.9% FLUSH
10.0000 mL | INTRAVENOUS | Status: DC | PRN
  Administered 2017-07-04: 10 mL
  Filled 2017-07-04: qty 10

## 2017-07-04 MED ORDER — DIPHENHYDRAMINE HCL 25 MG PO CAPS
ORAL_CAPSULE | ORAL | Status: AC
  Filled 2017-07-04: qty 1

## 2017-07-04 MED ORDER — SODIUM CHLORIDE 0.9 % IV SOLN
420.0000 mg | Freq: Once | INTRAVENOUS | Status: AC
  Administered 2017-07-04: 420 mg via INTRAVENOUS
  Filled 2017-07-04: qty 14

## 2017-07-04 NOTE — Telephone Encounter (Signed)
Per infusion room RN pt states she does not want to start gemzar today.  She wants to take a chemo break.  [name] made aware and he is ok for pt to take chemo break.  Pt is agreeable to get Herceptin today.  Pt also c/o itiching on back.  Per [name], advised pt to use an otc diphenhydramine cream.

## 2017-07-04 NOTE — Progress Notes (Signed)
1105: Pt refused Gemzar infusion. MD aware, NNO at this time.  Pt refused to stay 30 minute posy observation. Pt and VS stable at discharge.

## 2017-07-04 NOTE — Telephone Encounter (Signed)
Per [name], [place] to proceed with today's treatment with echocardiogram from April 2018.

## 2017-07-04 NOTE — Progress Notes (Signed)
START OFF PATHWAY REGIMEN - Breast   [id number]:Gemcitabine 1,000 mg/m2 D1, 8  q21 Days:   A cycle is every 21 days:     Gemcitabine   **Always confirm dose/schedule in your pharmacy ordering system**    Patient Characteristics: Metastatic Chemotherapy, HER2 Positive, ER Negative/Unknown, Fourth Line and Beyond Therapeutic Status: Distant Metastases BRCA Mutation Status: Did Not Order Test ER Status: Negative (-) HER2 Status: Positive (+) Would you be surprised if this patient died  in the next year? I would be surprised if this patient died in the next year PR Status: Negative (-) Line of therapy: Fourth Line and Beyond Intent of Therapy: Non-Curative / Palliative Intent, Discussed with Patient

## 2017-07-04 NOTE — Patient Instructions (Signed)
[facility] Discharge Instructions for Patients Receiving Chemotherapy  Today you received the following chemotherapy agents: herceptin and Perjeta   To help prevent nausea and vomiting after your treatment, we encourage you to take your nausea medication as direcred.    If you develop nausea and vomiting that is not controlled by your nausea medication, call the clinic.   BELOW ARE SYMPTOMS THAT SHOULD BE REPORTED IMMEDIATELY:  *FEVER GREATER THAN 100.5 F  *CHILLS WITH OR WITHOUT FEVER  NAUSEA AND VOMITING THAT IS NOT CONTROLLED WITH YOUR NAUSEA MEDICATION  *UNUSUAL SHORTNESS OF BREATH  *UNUSUAL BRUISING OR BLEEDING  TENDERNESS IN MOUTH AND THROAT WITH OR WITHOUT PRESENCE OF ULCERS  *URINARY PROBLEMS  *BOWEL PROBLEMS  UNUSUAL RASH Items with * indicate a potential emergency and should be followed up as soon as possible.  Feel free to call the clinic you have any questions or concerns. The clinic phone number is [phone].  Please show the [facility] at check-in to the Emergency Department and triage nurse.

## 2017-07-04 NOTE — Progress Notes (Signed)
[name]  [phone] [phone]  OFFICE PROGRESS NOTE   ID: [name]   DOB: 1956/06/17  MR#: [id number]  QMV#:[id number]   PCP: [name], MD [name], [name], [name] RAD MWU:[id number] [name], [name] 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  She sought attention and had a mammogram which showed an obvious carcinoma in the upper inner quadrant of the right breast, approximately 2 cm.  There were some enlarged lymph nodes in the axilla and an FNA done showed those consistent with malignant cells, most likely an invasive ductal carcinoma.  At that point she was unsure of what to do and was referred by Dr. [name] for a discussion of her treatment options.  By biopsy, it was ER/PR negative and HER-2 was 3+.  DNA index was 1.42.  We reiterated that it was most important to have her disease surgically addressed at that point and had recommended lumpectomy and axillary nodes to be addressed with which she followed through and on 04-03-02, Dr. [name] performed a right partial mastectomy and a right axillary lymph node dissection.  Final pathology revealed a 2.4 cm. high grade, Grade III invasive ductal carcinoma with an adjacent .8 cm. also high grade invasive ductal carcinoma which was felt to represent an intramammary metastasis rather than a second primary.  A smaller mass was just medial to the larger mass.  The smaller mass was associated with high grade DCIS component.  There was no definite lymphovascular invasion identified.  However, one of fourteen axillary lymph nodes did  contain a 1.5 cm. metastatic deposit.  Postoperatively she did very well.  We reiterated the fact that she did need adjuvant chemotherapy, however, she refused and decided that she would pursue radiation.  She received radiation and completed that on 08-14-02. Her subsequent history is as detailed below.   INTERVAL HISTORY: [name] returns today for follow-up of her estrogen receptor negative breast cancer. The interval history is significant chiefly for her son in [place] having had a "mental breakdown". She drove up there to support him and tolerated the treatment well herself.  She continues on trastuzumab and Pertuzumab. She generally tolerates that well. She was supposed to have had an echocardiogram prior to today's visit but for some reason that did not happen.  She is also on Abraxane. However she tells me she is beginning to develop neuropathy in both feet. We will need to change that medication.  REVIEW OF SYSTEMS: Markers pain is moderately well controlled on her current medications. She is chiefly treated for this by Dr. [name]. We prescribed methadone for her. She takes 15 mg 2 or sometimes 3 times a day. She does not get constipated from these medications. She does not report any symptoms suggestive of congestive heart failure. She went to the beach with her family for a few days, had significant sun exposure, and developed a sudden rash. That is clearing. A detailed review of systems today was otherwise stable   PAST MEDICAL HISTORY: Past Medical History:  Diagnosis Date  . Breast cancer ([facility])   . DVT (deep venous thrombosis) ([facility]) 10/07/2011  . Hypertension   . Radiation  11/29/2005-12/29/2005   right supraclavicular area 4147 cGy  . Radiation 06/26/02-08/14/02   right breast 5040 cGy, tumor bed boosted to 1260 cGy  . Rheumatic fever     PAST SURGICAL HISTORY: Past Surgical History:  Procedure Laterality Date  . BREAST SURGERY    . MASTECTOMY     bilateral  .  PORT A CATH REVISION      FAMILY HISTORY Family History  Problem Relation Age of Onset  . Pancreatic cancer Mother   . Kidney cancer Sister 84  . Breast cancer Sister 51  . Breast cancer Other 20  The patient's father died in his 75T  from complications of alcohol abuse. The patient's mother died in her 55s from pancreatic cancer. The patient has 6 brothers, 2 sisters. Both her sisters have had breast cancer, both diagnosed after the age of 34. The patient has not had genetic testing so far.   GYNECOLOGIC HISTORY: Menarche age 45; first live birth age 72. She is GXP4. She is not sure when she stopped having periods. She never used hormone replacement.  SOCIAL HISTORY: The patient has worked in the past as a Psychologist, counselling. At home in addition to the patient are her husband [name], originally from [place], who works as a Administrator.  Also living in the home is her daughter [name] (she is studying to be a Marine scientist).  The other 2 children are [name], who has a college degree in psychology and works as a Probation officer; and [name], who lives in [place] and works as a Higher education careers adviser.[name] also sometimes stays in the home. In addition the patient has an aid who helps her almost daily.   ADVANCED DIRECTIVES: Not in place  HEALTH MAINTENANCE: Social History  Substance Use Topics  . Smoking status: Light Tobacco Smoker  . Smokeless tobacco: Never Used  . Alcohol use Yes     Comment: Rarely     Colonoscopy:  Not on file  PAP:  Not on file  Bone density:  Not on file  Lipid panel:  Not on file    Allergies  Allergen Reactions  . Pork-Derived Products Other (See Comments)    Pt says she just doesn't eat pork; has no reaction to pork products  . Tramadol Nausea Only  . Hydrocodone-Acetaminophen Itching and Rash    Current Outpatient Prescriptions  Medication Sig Dispense Refill  . alprazolam (XANAX) 2 MG tablet Take 2 mg by mouth daily.     . Ascorbic Acid (VITAMIN C) 100 MG  tablet Take 100 mg by mouth daily.      . calcium-vitamin D (OSCAL WITH D) 500-200 MG-UNIT per tablet Take 1 tablet by mouth daily.      Marland Kitchen gabapentin (NEURONTIN) 300 MG capsule Take 1 capsule by mouth daily.    . methadone (DOLOPHINE) 10 MG tablet Take 1 tablet (10 mg total) by mouth every 8 (eight) hours. Take with 5 mg tablet to equal 15 mg every 8 hours 90 tablet 0  . methadone (DOLOPHINE) 5 MG tablet Take 1 tablet (5 mg total) by mouth every 8 (eight) hours. Take with 10 mg tab to equal 15 mg every 8 hours 90 tablet 0  . Multiple Vitamin (MULTIVITAMIN) capsule Take 1 capsule by mouth daily.      Marland Kitchen oxyCODONE-acetaminophen (PERCOCET) 10-325 MG tablet Take 2 tablets by mouth every 4 (four) hours as needed.  0  . oxymorphone (OPANA) 10 MG tablet Take 1 tablet by mouth 2 (two) times daily.  0  .  prochlorperazine (COMPAZINE) 10 MG tablet Take 1 tablet by mouth every 6 (six) hours as needed.    . sertraline (ZOLOFT) 50 MG tablet Take 1 tablet (50 mg total) by mouth daily. 30 tablet 3  . triamterene-hydrochlorothiazide (DYAZIDE) 37.5-25 MG capsule Take 1 each (1 capsule total) by mouth every morning. 30 capsule 3  . methylPREDNISolone (MEDROL DOSEPAK) 4 MG TBPK tablet TAKE AS DIRECTED (Patient not taking: Reported on 07/04/2017) 21 tablet 0   No current facility-administered medications for this visit.    Facility-Administered Medications Ordered in Other Visits  Medication Dose Route Frequency Provider Last Rate Last Dose  . sodium chloride 0.9 % injection 10 mL  10 mL Intravenous PRN Kana Reimann, [name], MD   10 mL at 03/04/14 1421    OBJECTIVE: Middle-aged African-American woman   Vitals:   07/04/17 0956  BP: (!) 126/59  Pulse: 67  Resp: 17  Temp: 98.4 F (36.9 C)  SpO2: 98%     Body mass index is 29.07 kg/m.    ECOG FS: 1 Filed Weights   07/04/17 0956  Weight: 180 lb 1.6 oz (81.7 kg)    Sclerae unicteric, pupils round and equal Oropharynx clear and moist No cervical or  supraclavicular adenopathy Lungs no rales or rhonchi Heart regular rate and rhythm Abd soft, nontender, positive bowel sounds MSK no focal spinal tenderness, chronic right grade 2 upper extremity lymphedema Neuro: nonfocal, well oriented, appropriate affect Breasts: Deferred Skin: The patient's back is imaged below.  Photo 07/04/2017     Photo 04/06/2017    LAB RESULTS:.  Lab Results  Component Value Date   WBC 6.8 07/04/2017   NEUTROABS 4.3 07/04/2017   HGB 12.2 07/04/2017   HCT 37.7 07/04/2017   MCV 86.0 07/04/2017   PLT 284 07/04/2017      Chemistry      Component Value Date/Time   NA 140 05/30/2017 0938   K 3.7 05/30/2017 0938   CL 105 01/24/2017 0657   CL 101 03/07/2013 1411   CO2 31 (H) 05/30/2017 0938   BUN 14.2 05/30/2017 0938   CREATININE 0.9 05/30/2017 0938      Component Value Date/Time   CALCIUM 9.3 05/30/2017 0938   ALKPHOS 101 05/30/2017 0938   AST 33 05/30/2017 0938   ALT 13 05/30/2017 0938   BILITOT 0.23 05/30/2017 0938       STUDIES: Most recent scans was a Feb 12, 2017 CT which showed no pulmonary embolism, small left effusion, and radiation changes in the right lung.Chest x-ray March 1928 showed her left-sided pacemaker unchanged in position and a stable left midlung opacity.   ASSESSMENT:  61 y.o. [place] woman with stage IV breast cancer manifested chiefly by loco-regional nodal disease (neck, chest) and skin involvement, without liver, bone, or brain metastases documented   (1) Status post right upper inner quadrant lumpectomy and sentinel lymph node sampling 03/04/2002 for 2 separate foci of invasive ductal carcinoma, mpT2 pN1 or stage IIB, both foci grade 3, both estrogen and progesterone receptor positive, both HercepTest 3+, Mib-1 56%  (2) Reexcision for margins 05/27/2002 showed no residual cancer in the breast.  (3) The patient refused adjuvant systemic therapy.  (4) Adjuvant radiation treatment completed 08/14/2002.  (5)  recurrence in the right breast in 02/2004 showing a morphologically different tumor, again grade 3, again estrogen and progesterone receptor negative, with an MIB-1 of 14% and Herceptest 3+.  (5) Between 03/2004 and 07/2004 she received dose dense Doxorubicin/Cyclophosphamide x 4 given with trastuzumab, followed by  weekly Docetaxel x 8, again given with trastuzumab.  (6) Right mastectomy 07/13/2004 showed scattered microscopic foci of residual disease over an area  greater than 5 cm. Margins were negative.  (7) Postoperative Docetaxel continued until 09/2004.  (8) Trastuzumab (Herceptin) given 08/2004 through 01/2012 with some brief interruptions.  (9) Isolated right cervical nodal recurrence 10/2005, treated with radiation to the right supraclavicular area (total 41.5 gray) completed 12/29/2005.  (10) Navelbine given together with Herceptin  11/2005 through 03/2006.  (9) Left mastectomy 02/13/2006 for ductal carcinoma in situ, grade 2, estrogen and progesterone receptor negative, with negative margins; 0 of 3 lymph nodes involved  (10) treated with Lapatinib and Capecitabine before 10/2009, for an unclear duration and with unclear results (cannot locate data on chart review).  (11) Status post right supraclavicular lymph node biopsy 09/2010 again positive for an invasive ductal carcinoma, estrogen and progesterone receptor negative, HER-2 positive by C[facility] with a ratio 4.25.  (12) Navelbine given together with Herceptin between 05/2011 and 11/2011.  (13) Carboplatin/ Gemcitabine/ Herceptin given for 2 cycles, in 12/2011 and 01/2012.  (14) TDM-1 ([place]) started 02/2012. Last dose 10/02/2013 after which the patient discontinued treatment at her own discretion. Echo on December 2014 showed a well preserved ejection fraction.  (15) Deep vein thrombosis of the right upper extremity documented 04/20/2011.  She completed anticoagulant therapy with Coumadin on 03/25/2013.  (16) Chronic right  upper extremity lymphedema, not responsive to aggressive PT  (a) biopsy of denuded area 04/23/2015 read as dermatofibroma (discordant)  (b) deeper cuts of 04/23/2015 biopsy suggest angiosarcoma  (17) Right chest port-a-cath removal due to infection on 01/28/2013. Left chest Port-A-Cath placed on 04/08/2013; being flushed every 6 weeks  RIGHT UPPER EXTREMITY ANGIOSARCOMA VS BREAST CANCER: August 2016 (18) treated at [facility] [place] August 2016 with paclitaxel, trastuzumab and pertuzumab 1 cycle, afterwards referred to hospice  (19) under the care of hospice of [facility] August 2016 to 05/08/2016, when the patient opted for a second try at chemotherapy  (20) started low-dose Abraxane, trastuzumab and pertuzumab 06/07/2016, to be repeated every 3 weeks.   (a) pretreatment echocardiogram 06/06/2016 showed a 60-65% ejection fraction  (b) significant compliance problems compromise response to treatment  (c) last echocardiogram 02/20/17 LVEF 55-60%  (d) Abraxane discontinued 07/04/2017 because of neuropathy symptoms  (e) Gemzar every 21 days started 07/04/2017  (21) Chronic pain secondary to known metastatic disease  (a) patient signed pain contract 10/19/2016  (b) as of 11/16/2016 receives her pain medications from Dr [name]  (c) we prescribe methadone to the patient but no other narcotics   PLAN: Kaoir is doing generally well as far as her cancer is concerned and this at least stable. However she is developing neuropathy and we are stopping the Abraxane today.  We discussed gemcitabine. She has a good understanding of the possible toxicities, side effects and complications of this agent. She is only going to get it on day 1 every 21 days. I have entered the appropriate orders today so she can get started.  I am treating her with Pertuzumab and trastuzumab today but she understands she really needs to have an echo before the next treatment and I have put that order written.  Otherwise we will not be able to treat her 3 weeks from now.  I have made her an appointment with my advanced practice provider in 3 weeks to make sure she tolerated the gemcitabine well. If so then we will continue that and then I will see her  again in October.  The patient knows to call for any problems that may develop before the next visit here.  Chauncey Cruel, MD 07/04/2017 10:06 AM

## 2017-07-19 ENCOUNTER — Other Ambulatory Visit: Payer: Self-pay

## 2017-07-19 ENCOUNTER — Telehealth: Payer: Self-pay | Admitting: *Deleted

## 2017-07-19 MED ORDER — METHADONE HCL 10 MG PO TABS: 10.0000 mg | ORAL_TABLET | Freq: Three times a day (TID) | ORAL | 0 refills | Status: DC

## 2017-07-19 MED ORDER — METHADONE HCL 5 MG PO TABS: 5.0000 mg | ORAL_TABLET | Freq: Three times a day (TID) | ORAL | 0 refills | Status: DC

## 2017-07-19 NOTE — Telephone Encounter (Signed)
Prescriptions placed in the prescription book yesterday.  Pt made aware

## 2017-07-19 NOTE — Telephone Encounter (Signed)
"  I need methadone refill.  Tumors in my back hurt.  I can't lie back.  I need my doctor's nurse."  Call transferred to collaborative.

## 2017-07-19 NOTE — Telephone Encounter (Signed)
Left VM for pt regarding methadone script. Per Elmyra Ricks, RN pt has script in script book that is available for pick up that can be filled tomorrow. Relayed this information in VM.  Cyndia Bent RN

## 2017-07-20 ENCOUNTER — Ambulatory Visit (HOSPITAL_COMMUNITY): Payer: Commercial Managed Care - PPO | Attending: Cardiovascular Disease

## 2017-07-20 ENCOUNTER — Other Ambulatory Visit: Payer: Self-pay

## 2017-07-20 DIAGNOSIS — Z923 Personal history of irradiation: Secondary | ICD-10-CM | POA: Diagnosis not present

## 2017-07-20 DIAGNOSIS — C4911 Malignant neoplasm of connective and soft tissue of right upper limb, including shoulder: Secondary | ICD-10-CM | POA: Diagnosis not present

## 2017-07-20 DIAGNOSIS — Z9221 Personal history of antineoplastic chemotherapy: Secondary | ICD-10-CM | POA: Insufficient documentation

## 2017-07-20 DIAGNOSIS — C50211 Malignant neoplasm of upper-inner quadrant of right female breast: Secondary | ICD-10-CM | POA: Insufficient documentation

## 2017-07-20 DIAGNOSIS — Z720 Tobacco use: Secondary | ICD-10-CM | POA: Insufficient documentation

## 2017-07-20 DIAGNOSIS — I10 Essential (primary) hypertension: Secondary | ICD-10-CM | POA: Insufficient documentation

## 2017-07-20 DIAGNOSIS — I080 Rheumatic disorders of both mitral and aortic valves: Secondary | ICD-10-CM | POA: Insufficient documentation

## 2017-07-20 DIAGNOSIS — Z170 Estrogen receptor positive status [ER+]: Secondary | ICD-10-CM | POA: Insufficient documentation

## 2017-07-21 ENCOUNTER — Other Ambulatory Visit: Payer: Self-pay | Admitting: Oncology

## 2017-07-21 ENCOUNTER — Other Ambulatory Visit: Payer: Self-pay

## 2017-07-21 MED ORDER — GABAPENTIN 300 MG PO CAPS: 300.0000 mg | ORAL_CAPSULE | Freq: Every day | ORAL | 3 refills | Status: DC

## 2017-07-24 ENCOUNTER — Telehealth: Payer: Self-pay

## 2017-07-24 NOTE — Telephone Encounter (Signed)
Pt called with wheezing and requesting a breathing treatment. She denies SOB, she has a little hoarse voice. S/w Mendel Ryder and she can see PCP or urgent care today or she can wait for her appt with Mendel Ryder tomorrow. When called pt back with options she did state that she coughed up some phlegm and does feel a little better.   She mentioned she is waking up at night to go to the bathroom and calls for her daughters. Her daughters live in other states.

## 2017-07-25 ENCOUNTER — Ambulatory Visit: Payer: Commercial Managed Care - PPO | Admitting: Oncology

## 2017-07-25 ENCOUNTER — Encounter: Payer: Self-pay | Admitting: Oncology

## 2017-07-25 ENCOUNTER — Ambulatory Visit: Payer: Commercial Managed Care - PPO

## 2017-07-25 ENCOUNTER — Other Ambulatory Visit: Payer: Commercial Managed Care - PPO

## 2017-07-25 NOTE — Progress Notes (Signed)
[place]  [phone] [phone]  OFFICE PROGRESS NOTE   ID: [name]   DOB: 1956-09-29  MR#: [id number]  OIB#:[id number]   PCP: [name], MD [name], [name], [name] RAD XIH:[id number] [name], [name] 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  She sought attention and had a mammogram which showed an obvious carcinoma in the upper inner quadrant of the right breast, approximately 2 cm.  There were some enlarged lymph nodes in the axilla and an FNA done showed those consistent with malignant cells, most likely an invasive ductal carcinoma.  At that point she was unsure of what to do and was referred by Dr. [name] for a discussion of her treatment options.  By biopsy, it was ER/PR negative and HER-2 was 3+.  DNA index was 1.42.  We reiterated that it was most important to have her disease surgically addressed at that point and had recommended lumpectomy and axillary nodes to be addressed with which she followed through and on 04-03-02, Dr. [name] performed a right partial mastectomy and a right axillary lymph node dissection.  Final pathology revealed a 2.4 cm. high grade, Grade III invasive ductal carcinoma with an adjacent .8 cm. also high grade invasive ductal carcinoma which was felt to represent an intramammary metastasis rather than a second primary.  A smaller mass was just medial to the larger mass.  The smaller mass was associated with high grade DCIS component.  There was no definite lymphovascular invasion identified.  However, one of fourteen axillary lymph nodes did  contain a 1.5 cm. metastatic deposit.  Postoperatively she did very well.  We reiterated the fact that she did need adjuvant chemotherapy, however, she refused and decided that she would pursue radiation.  She received radiation and completed that on 08-14-02. Her subsequent history is as detailed below.   INTERVAL HISTORY: [name] did not show for today's visit  REVIEW OF SYSTEMS:  PAST MEDICAL HISTORY: Past Medical History:  Diagnosis Date  . Breast cancer ([place])   . DVT (deep venous thrombosis) ([place]) 10/07/2011  . Hypertension   . Radiation 11/29/2005-12/29/2005   right supraclavicular area 4147 cGy  . Radiation 06/26/02-08/14/02   right breast 5040 cGy, tumor bed boosted to 1260 cGy  . Rheumatic fever     PAST SURGICAL HISTORY: Past Surgical History:  Procedure Laterality Date  . BREAST SURGERY    . MASTECTOMY     bilateral  . PORT A CATH REVISION      FAMILY HISTORY Family History  Problem Relation Age of Onset  . Pancreatic cancer Mother   . Kidney cancer Sister 51  . Breast cancer Sister 76  . Breast cancer Other 57  The patient's father died in his 00L  from complications of alcohol abuse. The patient's mother died in her 42s from pancreatic cancer. The patient has 6 brothers, 2 sisters. Both her sisters have had breast cancer, both diagnosed after the age of 29. The patient has not had genetic testing so far.   GYNECOLOGIC HISTORY: Menarche age 60; first live birth age 69. She is GXP4. She is not sure when she stopped having periods. She  never used hormone replacement.  SOCIAL HISTORY: The patient has worked in the past as a Psychologist, counselling. At home in addition to the patient are her husband [name], originally from [place], who works as a Administrator.  Also living in the home is her daughter [name] (she is studying to be a Marine scientist).  The other 2 children are [name], who has a college degree in psychology and works as a Probation officer; and [name], who lives in  [place] and works as a Higher education careers adviser.[name] also sometimes stays in the home. In addition the patient has an aid who helps her almost daily.   ADVANCED DIRECTIVES: Not in place  HEALTH MAINTENANCE: Social History  Substance Use Topics  . Smoking status: Light Tobacco Smoker  . Smokeless tobacco: Never Used  . Alcohol use Yes     Comment: Rarely     Colonoscopy:  Not on file  PAP:  Not on file  Bone density:  Not on file  Lipid panel:  Not on file    Allergies  Allergen Reactions  . Pork-Derived Products Other (See Comments)    Pt says she just doesn't eat pork; has no reaction to pork products  . Tramadol Nausea Only  . Hydrocodone-Acetaminophen Itching and Rash    Current Outpatient Prescriptions  Medication Sig Dispense Refill  . alprazolam (XANAX) 2 MG tablet Take 2 mg by mouth daily.     . Ascorbic Acid (VITAMIN C) 100 MG tablet Take 100 mg by mouth daily.      . calcium-vitamin D (OSCAL WITH D) 500-200 MG-UNIT per tablet Take 1 tablet by mouth daily.      Marland Kitchen gabapentin (NEURONTIN) 300 MG capsule Take 1 capsule (300 mg total) by mouth daily. 90 capsule 3  . methadone (DOLOPHINE) 10 MG tablet Take 1 tablet (10 mg total) by mouth every 8 (eight) hours. Take with 5 mg tablet to equal 15 mg every 8 hours 90 tablet 0  . methadone (DOLOPHINE) 5 MG tablet Take 1 tablet (5 mg total) by mouth every 8 (eight) hours. Take with 10 mg tab to equal 15 mg every 8 hours 90 tablet 0  . methylPREDNISolone (MEDROL DOSEPAK) 4 MG TBPK tablet TAKE AS DIRECTED (Patient not taking: Reported on 07/04/2017) 21 tablet 0  . Multiple Vitamin (MULTIVITAMIN) capsule Take 1 capsule by mouth daily.      Marland Kitchen oxyCODONE-acetaminophen (PERCOCET) 10-325 MG tablet Take 2 tablets by mouth every 4 (four) hours as needed.  0  . oxymorphone (OPANA) 10 MG tablet Take 1 tablet by mouth 2 (two) times daily.  0  . prochlorperazine (COMPAZINE) 10 MG tablet Take 1 tablet by mouth every 6 (six) hours as needed.    .  sertraline (ZOLOFT) 50 MG tablet Take 1 tablet (50 mg total) by mouth daily. 30 tablet 3  . triamterene-hydrochlorothiazide (DYAZIDE) 37.5-25 MG capsule Take 1 each (1 capsule total) by mouth every morning. 30 capsule 3   No current facility-administered medications for this visit.    Facility-Administered Medications Ordered in Other Visits  Medication Dose Route Frequency Provider Last Rate Last Dose  . sodium chloride 0.9 % injection 10 mL  10 mL Intravenous PRN Lamona Eimer, [name], MD   10 mL at 03/04/14 1421    OBJECTIVE: Middle-aged African-American woman   There were no vitals filed for this visit.   There is no height or weight on file to calculate BMI.    ECOG FS: 1 There were no vitals filed for this  visit.    Photo 07/04/2017     Photo 04/06/2017    LAB RESULTS:.  Lab Results  Component Value Date   WBC 6.8 07/04/2017   NEUTROABS 4.3 07/04/2017   HGB 12.2 07/04/2017   HCT 37.7 07/04/2017   MCV 86.0 07/04/2017   PLT 284 07/04/2017      Chemistry      Component Value Date/Time   NA 138 07/04/2017 0928   K 3.6 07/04/2017 0928   CL 105 01/24/2017 0657   CL 101 03/07/2013 1411   CO2 33 (H) 07/04/2017 0928   BUN 10.6 07/04/2017 0928   CREATININE 0.9 07/04/2017 0928      Component Value Date/Time   CALCIUM 9.6 07/04/2017 0928   ALKPHOS 102 07/04/2017 0928   AST 22 07/04/2017 0928   ALT 12 07/04/2017 0928   BILITOT 0.35 07/04/2017 0928       STUDIES: Most recent scans was a 2017-02-08 CT which showed no pulmonary embolism, small left effusion, and radiation changes in the right lung.Chest x-ray March 1928 showed her left-sided pacemaker unchanged in position and a stable left midlung opacity.   ASSESSMENT:  61 y.o. [place] woman with stage IV breast cancer manifested chiefly by loco-regional nodal disease (neck, chest) and skin involvement, without liver, bone, or brain metastases documented   (1) Status post right upper inner quadrant lumpectomy  and sentinel lymph node sampling 03/04/2002 for 2 separate foci of invasive ductal carcinoma, mpT2 pN1 or stage IIB, both foci grade 3, both estrogen and progesterone receptor positive, both HercepTest 3+, Mib-1 56%  (2) Reexcision for margins 05/27/2002 showed no residual cancer in the breast.  (3) The patient refused adjuvant systemic therapy.  (4) Adjuvant radiation treatment completed 08/14/2002.  (5) recurrence in the right breast in 02/2004 showing a morphologically different tumor, again grade 3, again estrogen and progesterone receptor negative, with an MIB-1 of 14% and Herceptest 3+.  (5) Between 03/2004 and 07/2004 she received dose dense Doxorubicin/Cyclophosphamide x 4 given with trastuzumab, followed by weekly Docetaxel x 8, again given with trastuzumab.  (6) Right mastectomy 07/13/2004 showed scattered microscopic foci of residual disease over an area  greater than 5 cm. Margins were negative.  (7) Postoperative Docetaxel continued until 09/2004.  (8) Trastuzumab (Herceptin) given 08/2004 through 01/2012 with some brief interruptions.  (9) Isolated right cervical nodal recurrence 10/2005, treated with radiation to the right supraclavicular area (total 41.5 gray) completed 12/29/2005.  (10) Navelbine given together with Herceptin  11/2005 through 03/2006.  (9) Left mastectomy 02/13/2006 for ductal carcinoma in situ, grade 2, estrogen and progesterone receptor negative, with negative margins; 0 of 3 lymph nodes involved  (10) treated with Lapatinib and Capecitabine before 10/2009, for an unclear duration and with unclear results (cannot locate data on chart review).  (11) Status post right supraclavicular lymph node biopsy 09/2010 again positive for an invasive ductal carcinoma, estrogen and progesterone receptor negative, HER-2 positive by CISH with a ratio 4.25.  (12) Navelbine given together with Herceptin between 05/2011 and 11/2011.  (13) Carboplatin/ Gemcitabine/  Herceptin given for 2 cycles, in 12/2011 and 01/2012.  (14) TDM-1 ([place]) started 02/2012. Last dose 10/02/2013 after which the patient discontinued treatment at her own discretion. Echo on December 2014 showed a well preserved ejection fraction.  (15) Deep vein thrombosis of the right upper extremity documented 04/20/2011.  She completed anticoagulant therapy with Coumadin on 03/25/2013.  (16) Chronic right upper extremity lymphedema, not responsive to aggressive PT  (a) biopsy of denuded  area 04/23/2015 read as dermatofibroma (discordant)  (b) deeper cuts of 04/23/2015 biopsy suggest angiosarcoma  (17) Right chest port-a-cath removal due to infection on 01/28/2013. Left chest Port-A-Cath placed on 04/08/2013; being flushed every 6 weeks  RIGHT UPPER EXTREMITY ANGIOSARCOMA VS BREAST CANCER: August 2016 (18) treated at [facility] of [place] August 2016 with paclitaxel, trastuzumab and pertuzumab 1 cycle, afterwards referred to hospice  (19) under the care of hospice of [facility] August 2016 to 05/08/2016, when the patient opted for a second try at chemotherapy  (20) started low-dose Abraxane, trastuzumab and pertuzumab 06/07/2016, to be repeated every 3 weeks.   (a) pretreatment echocardiogram 06/06/2016 showed a 60-65% ejection fraction  (b) significant compliance problems compromise response to treatment  (c) last echocardiogram 02/20/17 LVEF 55-60%  (d) Abraxane discontinued 07/04/2017 because of neuropathy symptoms  (e) Gemzar every 21 days started 07/04/2017  (21) Chronic pain secondary to known metastatic disease  (a) patient signed pain contract 10/19/2016  (b) as of 11/16/2016 receives her pain medications from Dr [name]  (c) we prescribe methadone to the patient but no other narcotics   PLAN: Reida did not show today. Letter has been sent.  Charm Stenner, [name], MD  07/25/17 9:20 AM Medical Oncology and Hematology [facility] [place], [place] [address] Tel. [phone]    Fax. [phone]  This document serves as a record of services personally performed by Lurline Del, MD. It was created on her behalf by Steva Colder, a trained medical scribe. The creation of this record is based on the scribe's personal observations and the provider's statements to them. This document has been checked and approved by the attending provider.

## 2017-07-26 ENCOUNTER — Telehealth: Payer: Self-pay

## 2017-07-26 ENCOUNTER — Telehealth: Payer: Self-pay | Admitting: Oncology

## 2017-07-26 NOTE — Telephone Encounter (Signed)
Spoke with [name] regarding rescheduling her missed infusion appointment yesterday. Infusion does not have any availability today. Gave patient scheduling number and instructed her to call scheduling to get her missed appointments rescheduled.  Cyndia Bent RN

## 2017-07-26 NOTE — Telephone Encounter (Signed)
Left patient voicemail regarding appts

## 2017-07-31 ENCOUNTER — Telehealth: Payer: Self-pay

## 2017-07-31 ENCOUNTER — Other Ambulatory Visit: Payer: Self-pay

## 2017-07-31 NOTE — Telephone Encounter (Signed)
Pt called asking to get abraxane back into her care plan, with the herceptin/perjeta. She is asking if [name] will see her on the 9th in infusion. Will forward info to [name], also instructed pt to let infusion nurse know on the 9th b/c [name] out of offfice for 3 weeks.

## 2017-08-04 ENCOUNTER — Telehealth: Payer: Self-pay | Admitting: *Deleted

## 2017-08-04 ENCOUNTER — Other Ambulatory Visit: Payer: Self-pay | Admitting: *Deleted

## 2017-08-04 NOTE — Telephone Encounter (Signed)
Pt called per VM stating need for refill on methadone " I am in pain ".  Per chart review noted last prescriptions were written on 07/19/2017 and per verification at [facility] prescriptions were dispensed and picked up on 07/21/2017.  This RN called pt and informed her of above with [name] stating " I still have some pills but I am calling because my bottle said 07/27/2017 "  This RN informed pt unsure what is on her bottle but per records scripts for 1 month supply was written on 07/19/2017 and picked up at [facility] on 07/21/2017. She will not be due for a refill until mid October.  Odalis replied " oh Val -you know I get all confused and I thought my bottle said 07/27/2017"  No other needs at this time.

## 2017-08-12 ENCOUNTER — Encounter (HOSPITAL_COMMUNITY): Payer: Self-pay | Admitting: Emergency Medicine

## 2017-08-12 ENCOUNTER — Ambulatory Visit (INDEPENDENT_AMBULATORY_CARE_PROVIDER_SITE_OTHER): Payer: Commercial Managed Care - PPO

## 2017-08-12 ENCOUNTER — Ambulatory Visit (HOSPITAL_COMMUNITY)
Admission: EM | Admit: 2017-08-12 | Discharge: 2017-08-12 | Disposition: A | Discharge status: Home / Self Care | Payer: Commercial Managed Care - PPO | Attending: Urgent Care | Admitting: Urgent Care

## 2017-08-12 ENCOUNTER — Encounter (HOSPITAL_COMMUNITY): Payer: Self-pay

## 2017-08-12 ENCOUNTER — Emergency Department (HOSPITAL_COMMUNITY)
Admission: EM | Admit: 2017-08-12 | Discharge: 2017-08-12 | Disposition: A | Discharge status: Home / Self Care | Payer: Commercial Managed Care - PPO | Attending: Emergency Medicine | Admitting: Emergency Medicine

## 2017-08-12 DIAGNOSIS — L03114 Cellulitis of left upper limb: Secondary | ICD-10-CM

## 2017-08-12 DIAGNOSIS — M79642 Pain in left hand: Secondary | ICD-10-CM | POA: Insufficient documentation

## 2017-08-12 DIAGNOSIS — Z5321 Procedure and treatment not carried out due to patient leaving prior to being seen by health care provider: Secondary | ICD-10-CM | POA: Insufficient documentation

## 2017-08-12 DIAGNOSIS — S60222A Contusion of left hand, initial encounter: Secondary | ICD-10-CM

## 2017-08-12 DIAGNOSIS — R2232 Localized swelling, mass and lump, left upper limb: Secondary | ICD-10-CM | POA: Diagnosis present

## 2017-08-12 MED ORDER — KETOROLAC TROMETHAMINE 30 MG/ML IJ SOLN
30.0000 mg | Freq: Once | INTRAMUSCULAR | Status: AC
  Administered 2017-08-12: 30 mg via INTRAMUSCULAR

## 2017-08-12 MED ORDER — CEPHALEXIN 500 MG PO CAPS: 500.0000 mg | ORAL_CAPSULE | Freq: Three times a day (TID) | ORAL | 0 refills | Status: DC

## 2017-08-12 MED ORDER — KETOROLAC TROMETHAMINE 30 MG/ML IJ SOLN
INTRAMUSCULAR | Status: AC
  Filled 2017-08-12: qty 1

## 2017-08-12 NOTE — ED Triage Notes (Signed)
Pt states Monday she slipped and fell on water, injuring her left hand. Pt left hand is swollen and red. Pt is cancer patient with right arm restriction. Pt states decreased mobility to left hand.

## 2017-08-12 NOTE — ED Triage Notes (Signed)
Pt presents to [facility] for a fall, pt states on Monday she slipped and fell on water, injuring her left hand by trying to break her fall, pt is cancer patient with right arm restriction. Pt states decreased mobility to left hand

## 2017-08-12 NOTE — ED Provider Notes (Signed)
MRN: [id number] DOB: 1956-10-04  Subjective:   [name] is a 61 y.o. female presenting for chief complaint of Hand Injury  Reports suffering a fall 5 days ago while moping the floor. She lost her footing, broke her fall with her left hand, fell on hard surface. Has since had progressive swelling, pain. Denies loss of sensation, bony deformity, bruising. She has not taken any medications for pain and inflammation.   Current Facility-Administered Medications:  .  ketorolac (TORADOL) 30 MG/ML injection 30 mg, 30 mg, Intramuscular, Once, Jaynee Eagles, PA-C  Current Outpatient Prescriptions:  .  alprazolam Duanne Moron) 2 MG tablet, Take 2 mg by mouth daily. , Disp: , Rfl:  .  Ascorbic Acid (VITAMIN C) 100 MG tablet, Take 100 mg by mouth daily.  , Disp: , Rfl:  .  calcium-vitamin D (OSCAL WITH D) 500-200 MG-UNIT per tablet, Take 1 tablet by mouth daily.  , Disp: , Rfl:  .  gabapentin (NEURONTIN) 300 MG capsule, Take 1 capsule (300 mg total) by mouth daily., Disp: 90 capsule, Rfl: 3 .  Multiple Vitamin (MULTIVITAMIN) capsule, Take 1 capsule by mouth daily.  , Disp: , Rfl:  .  oxyCODONE-acetaminophen (PERCOCET) 10-325 MG tablet, Take 2 tablets by mouth every 4 (four) hours as needed., Disp: , Rfl: 0 .  oxymorphone (OPANA) 10 MG tablet, Take 1 tablet by mouth 2 (two) times daily., Disp: , Rfl: 0 .  sertraline (ZOLOFT) 50 MG tablet, Take 1 tablet (50 mg total) by mouth daily., Disp: 30 tablet, Rfl: 3 .  triamterene-hydrochlorothiazide (DYAZIDE) 37.5-25 MG capsule, Take 1 each (1 capsule total) by mouth every morning., Disp: 30 capsule, Rfl: 3 .  methadone (DOLOPHINE) 10 MG tablet, Take 1 tablet (10 mg total) by mouth every 8 (eight) hours. Take with 5 mg tablet to equal 15 mg every 8 hours, Disp: 90 tablet, Rfl: 0 .  methadone (DOLOPHINE) 5 MG tablet, Take 1 tablet (5 mg total) by mouth every 8 (eight) hours. Take with 10 mg tab to equal 15 mg every 8 hours, Disp: 90 tablet, Rfl: 0 .  methylPREDNISolone  (MEDROL DOSEPAK) 4 MG TBPK tablet, TAKE AS DIRECTED (Patient not taking: Reported on 07/04/2017), Disp: 21 tablet, Rfl: 0 .  prochlorperazine (COMPAZINE) 10 MG tablet, Take 1 tablet by mouth every 6 (six) hours as needed., Disp: , Rfl:   Facility-Administered Medications Ordered in Other Encounters:  .  sodium chloride 0.9 % injection 10 mL, 10 mL, Intravenous, PRN, [name], [name], MD, 10 mL at 03/04/14 1421   [name] is allergic to pork-derived products; tramadol; and hydrocodone-acetaminophen.  [name]  has a past medical history of Breast cancer ([place]); DVT (deep venous thrombosis) ([place]) (10/07/2011); Hypertension; Radiation (11/29/2005-12/29/2005); Radiation (06/26/02-08/14/02); and Rheumatic fever. Also  has a past surgical history that includes Mastectomy; Breast surgery; and Port a cath revision.  Objective:   Vitals: BP (!) 117/59 (BP Location: Left Arm)   Pulse 72   Temp 98.5 F (36.9 C) (Oral)   Resp 16   SpO2 98%   Physical Exam  Constitutional: She is oriented to person, place, and time. She appears well-developed and well-nourished.  Cardiovascular: Normal rate.   Pulmonary/Chest: Effort normal.  Musculoskeletal:       Left hand: She exhibits decreased range of motion (full thumb flexion), tenderness and swelling (over hand between wrist and MCPs). She exhibits no bony tenderness, normal capillary refill, no deformity and no laceration. Normal sensation noted. Normal strength noted.  Hands: Neurological: She is alert and oriented to person, place, and time.   Dg Hand Complete Left  Addendum Date: 08/12/2017   ADDENDUM REPORT: 08/12/2017 14:10 ADDENDUM: Voice recognition error in the impression. Impression should read as follows: No acute osseous injury of the left hand. Electronically Signed   By: Kathreen Devoid   On: 08/12/2017 14:10   Result Date: 08/12/2017 CLINICAL DATA:  Patient states that she fell while mopping her floor 3 days ago, pain and swelling in the left  hand. EXAM: LEFT HAND - COMPLETE 3+ VIEW COMPARISON:  None. FINDINGS: No acute fracture or dislocation. Mild osteoarthritis of the first [facility] joint and first IP joint. Mild osteoarthritis of the DIP joints. Mild osteoarthritis of the first MCP joint, second MCP joint and third MCP joint. No aggressive osseous lesion. Severe soft tissue swelling along the dorsal aspect of the left hand. IMPRESSION: 1.  No acute osseous injury of the left and. Electronically Signed: By: Kathreen Devoid On: 08/12/2017 14:07     Assessment and Plan :   Contusion of left hand, initial encounter  Cellulitis of left hand  Will manage for cellulitis given physical exam findings of erythema, pain, swelling. Patient may also be having a gout attack although she denies history of gout, was not agreeable to testing. ER and return-to-clinic precautions discussed, patient verbalized understanding. Otherwise, patient is to follow up in 2 days.  Jaynee Eagles, PA-C [facility]  08/12/2017  1:42 PM    Jaynee Eagles, PA-C 08/12/17 1428

## 2017-08-13 ENCOUNTER — Ambulatory Visit (HOSPITAL_COMMUNITY)
Admission: EM | Admit: 2017-08-13 | Discharge: 2017-08-13 | Disposition: A | Discharge status: Home / Self Care | Payer: Commercial Managed Care - PPO

## 2017-08-14 ENCOUNTER — Other Ambulatory Visit: Payer: Self-pay | Admitting: Oncology

## 2017-08-14 ENCOUNTER — Telehealth: Payer: Self-pay

## 2017-08-14 ENCOUNTER — Ambulatory Visit (HOSPITAL_BASED_OUTPATIENT_CLINIC_OR_DEPARTMENT_OTHER): Payer: Commercial Managed Care - PPO | Admitting: Medical

## 2017-08-14 ENCOUNTER — Telehealth: Payer: Self-pay | Admitting: *Deleted

## 2017-08-14 ENCOUNTER — Ambulatory Visit: Payer: Commercial Managed Care - PPO

## 2017-08-14 VITALS — BP 117/43 | HR 88 | Temp 98.5°F | Resp 18 | Ht 66.0 in | Wt 189.0 lb

## 2017-08-14 DIAGNOSIS — L03114 Cellulitis of left upper limb: Secondary | ICD-10-CM

## 2017-08-14 DIAGNOSIS — C4911 Malignant neoplasm of connective and soft tissue of right upper limb, including shoulder: Secondary | ICD-10-CM

## 2017-08-14 DIAGNOSIS — C50211 Malignant neoplasm of upper-inner quadrant of right female breast: Secondary | ICD-10-CM | POA: Diagnosis not present

## 2017-08-14 MED ORDER — DOXYCYCLINE HYCLATE 100 MG PO TABS: 100.0000 mg | ORAL_TABLET | Freq: Two times a day (BID) | ORAL | 0 refills | Status: AC

## 2017-08-14 NOTE — Telephone Encounter (Signed)
This RN informed MD of outcome of symptom management visit this afternoon as well as inquiry for schedule treatment tomorrow for herceptin and perjeta.  Per MD review - agree with [facility] plan with recommendation for pt to come in tomorrow for herceptin only.  This RN contacted pt and informed her of the above. [name] verbalized understanding as well as appreciation of care provided today.  [name] wanted to thank [name] and " Dr [name] " for their attention today.  This note will be forwarded for communication of above.

## 2017-08-14 NOTE — Telephone Encounter (Signed)
Pt fell a week ago on her hand. Her hand swelled this week and she went to urgent care on Saturday. They ddx cellulitis of hand. Gave her antibiotics. She has chemo tomorrow. S/w Val. She will set up for [name] or herself to see pt in infusion tomorrow. Instructed pt to continue taking her antibiotics.

## 2017-08-15 ENCOUNTER — Other Ambulatory Visit (HOSPITAL_BASED_OUTPATIENT_CLINIC_OR_DEPARTMENT_OTHER): Payer: Commercial Managed Care - PPO

## 2017-08-15 ENCOUNTER — Telehealth: Payer: Self-pay | Admitting: Medical

## 2017-08-15 ENCOUNTER — Telehealth: Payer: Self-pay | Admitting: *Deleted

## 2017-08-15 ENCOUNTER — Ambulatory Visit: Payer: Commercial Managed Care - PPO

## 2017-08-15 ENCOUNTER — Ambulatory Visit (HOSPITAL_BASED_OUTPATIENT_CLINIC_OR_DEPARTMENT_OTHER): Payer: Commercial Managed Care - PPO

## 2017-08-15 VITALS — BP 106/48 | HR 72 | Temp 98.6°F | Resp 19

## 2017-08-15 DIAGNOSIS — C50211 Malignant neoplasm of upper-inner quadrant of right female breast: Secondary | ICD-10-CM

## 2017-08-15 DIAGNOSIS — C4911 Malignant neoplasm of connective and soft tissue of right upper limb, including shoulder: Secondary | ICD-10-CM | POA: Diagnosis not present

## 2017-08-15 DIAGNOSIS — Z95828 Presence of other vascular implants and grafts: Secondary | ICD-10-CM

## 2017-08-15 DIAGNOSIS — Z5112 Encounter for antineoplastic immunotherapy: Secondary | ICD-10-CM

## 2017-08-15 DIAGNOSIS — Z170 Estrogen receptor positive status [ER+]: Secondary | ICD-10-CM

## 2017-08-15 LAB — CBC WITH DIFFERENTIAL/PLATELET
BASO%: 0.5 % (ref 0.0–2.0)
Basophils Absolute: 0 10*3/uL (ref 0.0–0.1)
EOS%: 5.3 % (ref 0.0–7.0)
Eosinophils Absolute: 0.4 10*3/uL (ref 0.0–0.5)
HCT: 36.2 % (ref 34.8–46.6)
HGB: 11.5 g/dL — ABNORMAL LOW (ref 11.6–15.9)
LYMPH%: 16.8 % (ref 14.0–49.7)
MCH: 27.6 pg (ref 25.1–34.0)
MCHC: 31.8 g/dL (ref 31.5–36.0)
MCV: 86.8 fL (ref 79.5–101.0)
MONO#: 0.7 10*3/uL (ref 0.1–0.9)
MONO%: 8.3 % (ref 0.0–14.0)
NEUT#: 5.5 10*3/uL (ref 1.5–6.5)
NEUT%: 69.1 % (ref 38.4–76.8)
Platelets: 203 10*3/uL (ref 145–400)
RBC: 4.17 10*6/uL (ref 3.70–5.45)
RDW: 15 % — ABNORMAL HIGH (ref 11.2–14.5)
WBC: 7.9 10*3/uL (ref 3.9–10.3)
lymph#: 1.3 10*3/uL (ref 0.9–3.3)

## 2017-08-15 LAB — COMPREHENSIVE METABOLIC PANEL
ALT: 11 U/L (ref 0–55)
AST: 19 U/L (ref 5–34)
Albumin: 3.2 g/dL — ABNORMAL LOW (ref 3.5–5.0)
Alkaline Phosphatase: 106 U/L (ref 40–150)
Anion Gap: 10 mEq/L (ref 3–11)
BUN: 15.3 mg/dL (ref 7.0–26.0)
CO2: 28 mEq/L (ref 22–29)
Calcium: 9.4 mg/dL (ref 8.4–10.4)
Chloride: 101 mEq/L (ref 98–109)
Creatinine: 0.8 mg/dL (ref 0.6–1.1)
EGFR: 89 mL/min/{1.73_m2} — ABNORMAL LOW (ref >90)
Glucose: 136 mg/dl (ref 70–140)
Potassium: 3.3 mEq/L — ABNORMAL LOW (ref 3.5–5.1)
Sodium: 139 mEq/L (ref 136–145)
Total Bilirubin: 0.31 mg/dL (ref 0.20–1.20)
Total Protein: 7.8 g/dL (ref 6.4–8.3)

## 2017-08-15 MED ORDER — METHADONE HCL 10 MG PO TABS: 10.0000 mg | ORAL_TABLET | Freq: Three times a day (TID) | ORAL | 0 refills | Status: DC

## 2017-08-15 MED ORDER — TRASTUZUMAB CHEMO 150 MG IV SOLR
450.0000 mg | Freq: Once | INTRAVENOUS | Status: AC
  Administered 2017-08-15: 450 mg via INTRAVENOUS
  Filled 2017-08-15: qty 21.43

## 2017-08-15 MED ORDER — HEPARIN SOD (PORK) LOCK FLUSH 100 UNIT/ML IV SOLN
500.0000 [IU] | Freq: Once | INTRAVENOUS | Status: AC
  Administered 2017-08-15: 500 [IU] via INTRAVENOUS
  Filled 2017-08-15: qty 5

## 2017-08-15 MED ORDER — DIPHENHYDRAMINE HCL 25 MG PO CAPS
25.0000 mg | ORAL_CAPSULE | Freq: Once | ORAL | Status: AC
  Administered 2017-08-15: 25 mg via ORAL

## 2017-08-15 MED ORDER — SODIUM CHLORIDE 0.9 % IV SOLN
Freq: Once | INTRAVENOUS | Status: AC
  Administered 2017-08-15: 12:00:00 via INTRAVENOUS

## 2017-08-15 MED ORDER — ACETAMINOPHEN 325 MG PO TABS
ORAL_TABLET | ORAL | Status: AC
  Filled 2017-08-15: qty 2

## 2017-08-15 MED ORDER — ACETAMINOPHEN 325 MG PO TABS
650.0000 mg | ORAL_TABLET | Freq: Once | ORAL | Status: AC
  Administered 2017-08-15: 650 mg via ORAL

## 2017-08-15 MED ORDER — SODIUM CHLORIDE 0.9% FLUSH
10.0000 mL | INTRAVENOUS | Status: DC | PRN
  Administered 2017-08-15: 10 mL
  Filled 2017-08-15: qty 10

## 2017-08-15 MED ORDER — METHADONE HCL 5 MG PO TABS: 5.0000 mg | ORAL_TABLET | Freq: Three times a day (TID) | ORAL | 0 refills | Status: DC

## 2017-08-15 MED ORDER — SODIUM CHLORIDE 0.9 % IJ SOLN
10.0000 mL | INTRAMUSCULAR | Status: DC | PRN
  Administered 2017-08-15: 10 mL via INTRAVENOUS
  Filled 2017-08-15: qty 10

## 2017-08-15 MED ORDER — POTASSIUM CHLORIDE ER 10 MEQ PO CPCR
20.0000 meq | ORAL_CAPSULE | Freq: Every day | ORAL | 3 refills | Status: DC
Start: 2017-08-15 — End: 2018-06-12

## 2017-08-15 MED ORDER — DIPHENHYDRAMINE HCL 25 MG PO CAPS
ORAL_CAPSULE | ORAL | Status: AC
  Filled 2017-08-15: qty 1

## 2017-08-15 NOTE — Telephone Encounter (Signed)
"  I need to speak with Dr. [name] nurse.  My thumb is hard to get wrapped to come in for today's appointment.  The Band-Aid won't stick."  Denies applying lotion to hands.  Admits to having large Band-Aid to cover this area.  This nurse asked her to use a small cotton ball or make up square dipped in alcohol to clean skin she wants Band-Aid to adhere or stick to.  "Can I speak with My Doctor's nurse?"  Call transferred as requested.    First appointment today is 10:15 am.

## 2017-08-15 NOTE — Telephone Encounter (Signed)
Per 10/8 - no los at checkout °

## 2017-08-15 NOTE — Progress Notes (Signed)
[facility] Progress Note   [name] [id number] 02-Aug-1956 61 y.o.  [name] is managed by Dr. Gunnar Bulla [name]  Actively treated with chemotherapy: yes  Current Therapy: Gemzar and Herceptin  Schedule for chemotherapy on 08/15/2017.  Assessment: Plan:    Cellulitis of left upper extremity - Plan: doxycycline (VIBRA-TABS) 100 MG tablet, Culture, Wound  Cellulitis of the left hand: The large blister at the base of the left thumb was opened with a sterile 22-gauge needle after being cleaned with chlorhexidine 3. Copious purulent material was expressed from the wound. A sample was collected for culture. The patient was put on doxycycline 100 mg by mouth twice a day. She was told to discontinue Keflex. The patient's wound was dressed. The patient was told to soak her hand in warm salt water several times daily beginning tomorrow. She was told to tentatively hold chemotherapy tomorrow. She was told that she would be contacted with instructions as to when she would be treated.  Please see After Visit Summary for patient specific instructions.  Future Appointments Date Time Provider [facility]  08/15/2017 10:15 AM [facility] LAB 6 [facility] None  08/15/2017 10:30 AM [facility] FLUSH NURSE [facility] None  08/15/2017 11:00 AM [facility] D12 [facility] None  09/05/2017 11:45 AM [facility] LAB 4 [facility] None  09/05/2017 12:00 PM [facility] FLUSH NURSE [facility] None  09/05/2017 12:30 PM [name], [name], MD [facility] None  09/05/2017 1:30 PM [facility] B7 [facility] None    Orders Placed This Encounter  Procedures  . Culture, Wound       Subjective:   Patient ID:  [name] is a 61 y.o. (DOB Oct 22, 1956) female.  Chief Complaint:  Chief Complaint  Patient presents with  . 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 She fell onto her left hand as she was trying to break her fall. At the time of her visit to urgent care she reported that she was having progressive swelling and pain in her left hand. She had not taken any medication for pain or inflammation. An x-ray of her hand was completed which showed no evidence of a fracture or dislocation. Mild osteoarthritis was noted in multiple joints. Severe soft tissue swelling was noted over the dorsal aspect of the left hand. She was given a prescription for Keflex. Her hand continues to be swollen, tender, erythematous, warm, and has developed a large blister over the lateral surface of the left thumb base despite taking Keflex.  She denies fevers, chills, sweats, or lymphadenopathy in her left medial elbow or axilla.  Medications: I have reviewed the patient's current medications.  Allergies:  Allergies  Allergen Reactions  . Pork-Derived Products Other (See Comments)    Pt says she just doesn't eat pork; has no reaction to pork products  . Tramadol Nausea Only  . Hydrocodone-Acetaminophen Itching and Rash    Past Medical History:  Diagnosis Date  . Breast cancer ([place])   . DVT (deep venous thrombosis) ([place]) 10/07/2011  . Hypertension   . Radiation 11/29/2005-12/29/2005   right supraclavicular area 4147 cGy  . Radiation 06/26/02-08/14/02   right breast 5040 cGy, tumor bed boosted to 1260 cGy  . Rheumatic fever     Past Surgical History:  Procedure Laterality Date  . BREAST SURGERY    .  MASTECTOMY     bilateral  . PORT A CATH REVISION      Family History  Problem Relation Age of Onset  . Pancreatic cancer Mother   . Kidney cancer Sister 59  . Breast cancer Sister 98  . Breast cancer Other 91    Social History   Social History  . Marital status: Single    Spouse name: N/A  . Number of children: N/A  . Years of education: N/A   Occupational History  . Not on  file.   Social History Main Topics  . Smoking status: Light Tobacco Smoker  . Smokeless tobacco: Never Used  . Alcohol use Yes     Comment: Rarely  . Drug use: No  . Sexual activity: Yes    Birth control/ protection: Post-menopausal   Other Topics Concern  . Not on file   Social History Narrative  . No narrative on file    Past Medical History, Surgical history, Social history, and Family history were reviewed and updated as appropriate.   Please see review of systems for further details on the patient's review from today.   Review of Systems:  Review of Systems  Constitutional: Negative for chills, diaphoresis and fever.  Skin: Positive for wound.       Left hand swelling with increased warmth, erythema, tenderness, and a large blister over the left lateral thumb base.  Hematological: Negative for adenopathy.    Objective:   Physical Exam:  BP (!) 117/43 (BP Location: Left Arm, Patient Position: Sitting)   Pulse 88   Temp 98.5 F (36.9 C) (Oral)   Resp 18   Ht 5\' 6"  (1.676 m)   Wt 189 lb (85.7 kg)   SpO2 99%   BMI 30.51 kg/m  ECOG: 1  Physical Exam  Constitutional: No distress.  HENT:  Head: Normocephalic and atraumatic.  Cardiovascular: Normal rate, regular rhythm and normal heart sounds.  Exam reveals no gallop and no friction rub.   No murmur heard. Pulmonary/Chest: Effort normal and breath sounds normal. No respiratory distress. She has no wheezes. She has no rales.  Lymphadenopathy:       Left axillary: No pectoral and no lateral adenopathy present.      Left: No epitrochlear adenopathy present.  Neurological: She is alert. Coordination normal.  Skin: Skin is warm and dry. She is not diaphoretic. There is erythema.             Lab Review:     Component Value Date/Time   NA 138 07/04/2017 0928   K 3.6 07/04/2017 0928   CL 105 01/24/2017 0657   CL 101 03/07/2013 1411   CO2 33 (H) 07/04/2017 0928   GLUCOSE 102 07/04/2017 0928   GLUCOSE 92  03/07/2013 1411   BUN 10.6 07/04/2017 0928   CREATININE 0.9 07/04/2017 0928   CALCIUM 9.6 07/04/2017 0928   PROT 7.7 07/04/2017 0928   ALBUMIN 3.6 07/04/2017 0928   AST 22 07/04/2017 0928   ALT 12 07/04/2017 0928   ALKPHOS 102 07/04/2017 0928   BILITOT 0.35 07/04/2017 0928   GFRNONAA >60 01/24/2017 0657   GFRAA >60 01/24/2017 0657       Component Value Date/Time   WBC 6.8 07/04/2017 0928   WBC 8.1 01/24/2017 0657   RBC 4.39 07/04/2017 0928   RBC 4.12 01/24/2017 0657   HGB 12.2 07/04/2017 0928   HCT 37.7 07/04/2017 0928   PLT 284 07/04/2017 0928   MCV 86.0 07/04/2017 0928  MCH 27.8 07/04/2017 0928   MCH 27.2 01/24/2017 0657   MCHC 32.3 07/04/2017 0928   MCHC 32.3 01/24/2017 0657   RDW 16.5 (H) 07/04/2017 0928   LYMPHSABS 1.4 07/04/2017 0928   MONOABS 0.6 07/04/2017 0928   EOSABS 0.5 07/04/2017 0928   BASOSABS 0.1 07/04/2017 0928   -------------------------------  Imaging from last 24 hours (if applicable):  Radiology interpretation: Dg Hand Complete Left  Addendum Date: 08/12/2017   ADDENDUM REPORT: 08/12/2017 14:10 ADDENDUM: Voice recognition error in the impression. Impression should read as follows: No acute osseous injury of the left hand. Electronically Signed   By: Kathreen Devoid   On: 08/12/2017 14:10   Result Date: 08/12/2017 CLINICAL DATA:  Patient states that she fell while mopping her floor 3 days ago, pain and swelling in the left hand. EXAM: LEFT HAND - COMPLETE 3+ VIEW COMPARISON:  None. FINDINGS: No acute fracture or dislocation. Mild osteoarthritis of the first [facility] joint and first IP joint. Mild osteoarthritis of the DIP joints. Mild osteoarthritis of the first MCP joint, second MCP joint and third MCP joint. No aggressive osseous lesion. Severe soft tissue swelling along the dorsal aspect of the left hand. IMPRESSION: 1.  No acute osseous injury of the left and. Electronically Signed: By: Kathreen Devoid On: 08/12/2017 14:07

## 2017-08-15 NOTE — Patient Instructions (Signed)
[facility] Discharge Instructions for Patients Receiving Chemotherapy  Today you received the following chemotherapy agents: herceptin  To help prevent nausea and vomiting after your treatment, we encourage you to take your nausea medication as direcred.    If you develop nausea and vomiting that is not controlled by your nausea medication, call the clinic.   BELOW ARE SYMPTOMS THAT SHOULD BE REPORTED IMMEDIATELY:  *FEVER GREATER THAN 100.5 F  *CHILLS WITH OR WITHOUT FEVER  NAUSEA AND VOMITING THAT IS NOT CONTROLLED WITH YOUR NAUSEA MEDICATION  *UNUSUAL SHORTNESS OF BREATH  *UNUSUAL BRUISING OR BLEEDING  TENDERNESS IN MOUTH AND THROAT WITH OR WITHOUT PRESENCE OF ULCERS  *URINARY PROBLEMS  *BOWEL PROBLEMS  UNUSUAL RASH Items with * indicate a potential emergency and should be followed up as soon as possible.  Feel free to call the clinic you have any questions or concerns. The clinic phone number is [phone].  Please show the [facility] at check-in to the Emergency Department and triage nurse.

## 2017-08-15 NOTE — Telephone Encounter (Signed)
This RN redressed wound to left hand post I&D performed yesterday in [facility] as well as to reassure pt that treatment she is receiving today is not " chemo " but only the herceptin. Reiterated with her that herceptin does not kill cells nor impair wound healing but it works directly on a componant of her known cancer - need to maintain treatment to keep her disease controlled.  Dorothea stated concern " it is pink under there " per her dressing change this am.  This RN assessed wound which was clean without obvious drainage and no odor.  Skin under incision for drainage was pink and intact.  This RN explained to pt that above is what is expected to be seen in a woman of her color with good wound healing.  This RN cleansed wound with normal saline and re- dressed per patient's preference with 2x2s and co-bane.  Note pt states she " squished " some yellow drainage out after dressing was removed and this RN had gotten up to obtain appropriate supplies.  No further drainage noted. Overall swelling  of hand compared to yesterday was improved by 50%.  Labs reviewed with pt including potassium of 3.3 and pt is on HCTZ.  Steffie states she " has not taken her potassium lately " - per EPIC- potassium is not on med list with Shaylan stating "oh I just take the OTC kind ". This RN informed pt above is a supplement and will not replace her potassium appropriately.  Prescription for Micro K sent to her pharmacy. Refills for methadone given today per visit to the office with refill due on 11/12.  No other needs at this time.

## 2017-08-16 ENCOUNTER — Other Ambulatory Visit: Payer: Self-pay | Admitting: Oncology

## 2017-08-16 LAB — WOUND CULTURE: Organism ID, Bacteria: NONE SEEN

## 2017-08-21 ENCOUNTER — Other Ambulatory Visit: Payer: Self-pay | Admitting: Medical

## 2017-08-21 ENCOUNTER — Telehealth: Payer: Self-pay

## 2017-08-21 DIAGNOSIS — Z22322 Carrier or suspected carrier of Methicillin resistant Staphylococcus aureus: Secondary | ICD-10-CM

## 2017-08-21 MED ORDER — MUPIROCIN CALCIUM 2 % NA OINT: 1.0000 "application " | TOPICAL_OINTMENT | Freq: Two times a day (BID) | NASAL | 0 refills | Status: DC

## 2017-08-21 NOTE — Telephone Encounter (Signed)
Called pt that rx sent.

## 2017-08-21 NOTE — Telephone Encounter (Signed)
This prescription has been sent to her pharmacy as requested.

## 2017-08-21 NOTE — Telephone Encounter (Signed)
Pt called for results of wound culture. Gave her results of MRSA. She states last time she had MRSA she had a cream she pt in her nose TID, even though the MRSA was on her buttock. She does not know the name of it. She has had it filled in the past at  [facility] [address] from Dr Margaretha Seeds.   She is also using cetaphil soap for her hands.   S/w [name] and he will Rx this. Please call pt when done.

## 2017-08-21 NOTE — Progress Notes (Signed)
Bactroban 

## 2017-08-22 ENCOUNTER — Telehealth: Payer: Self-pay

## 2017-08-22 ENCOUNTER — Other Ambulatory Visit: Payer: Self-pay | Admitting: Oncology

## 2017-08-22 NOTE — Telephone Encounter (Signed)
Pt called that she went to pharmacy to pick up cream and it is not made any more. She is talking about bactroban. Called [facility] and they confirmed that the small nasal ointment tubes are no longer made. The larger 22 gm tubes are made. Pt said she made appt with dermatologist for Thursday and will get this taken care of then. For [name] not to send any new Rx to [facility]. S/w [name] and he was in agreement.

## 2017-08-23 ENCOUNTER — Telehealth: Payer: Self-pay | Admitting: *Deleted

## 2017-08-23 NOTE — Telephone Encounter (Signed)
"  Juliann Pulse please call me back because I need you to fax my records to my medical doctor so he can see me today.  My finger is wide open,  I need it to be t aken care of right now.  [phone]. "  Routing call information to collaborative nurse and provider for review.  Further patient communication through collaborative nurse.

## 2017-08-31 NOTE — Progress Notes (Signed)
[place]  [phone] [phone]     ID: [name]   DOB: [birth year]  MR#: [id number]  EYC#:[id number]   PCP: [name] Hey, MD  Patient Care Team: [name] Hey, MD as PCP - General [name], [name] Dad, MD as Consulting Physician (Oncology) [name], MD as Consulting Physician (Radiation Oncology)  SU: [name]. 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  She sought attention and had a mammogram which showed an obvious carcinoma in the upper inner quadrant of the right breast, approximately 2 cm.  There were some enlarged lymph nodes in the axilla and an FNA done showed those consistent with malignant cells, most likely an invasive ductal carcinoma.  At that point she was unsure of what to do and was referred by Dr. Marylene [name] for a discussion of her treatment options.  By biopsy, it was ER/PR negative and HER-2 was 3+.  DNA index was 1.42.  We reiterated that it was most important to have her disease surgically addressed at that point and had recommended lumpectomy and axillary nodes to be addressed with which she followed through and on 04-03-02, Dr. Annamaria [name] performed a right partial mastectomy and a right axillary lymph node dissection.  Final pathology revealed a 2.4 cm. high grade, Grade III invasive ductal carcinoma with an adjacent .8 cm. also high grade invasive ductal carcinoma which was felt to represent an intramammary metastasis rather than a second primary.  A smaller mass was just medial to the larger mass.  The smaller mass was associated with  high grade DCIS component.  There was no definite lymphovascular invasion identified.  However, one of fourteen axillary lymph nodes did contain a 1.5 cm. metastatic deposit.  Postoperatively she did very well.  We reiterated the fact that she did need adjuvant chemotherapy, however, she refused and decided that she would pursue radiation.  She received radiation and completed that on 08-14-02. Her subsequent history is as detailed below.   INTERVAL HISTORY: [name] returns today for a follow-up.  REVIEW OF SYSTEMS: [name] has been doing well, except for an area in her back that causes her discomfort. She notes that it is mainly at night but she will experience it during the day as well. She does feel it all the time. She has no leg weakness. Pt reports normal bowel movements everyday. Everything at home is stable. She denies unusual headaches, visual changes, nausea, vomiting, or dizziness. There has been no unusual cough, phlegm production, or pleurisy. This been no change in bowel or bladder habits. She denies unexplained fatigue or unexplained weight loss, bleeding, rash, or fever. A detailed review of systems was otherwise entirely stable.   PAST MEDICAL HISTORY: Past Medical History:  Diagnosis Date  . Breast cancer ([name])   . DVT (deep venous thrombosis) ([place]) 10/07/2011  . Hypertension   . Radiation 11/29/2005-12/29/2005   right supraclavicular area 4147 cGy  . Radiation 06/26/02-08/14/02   right breast 5040 cGy, tumor bed boosted to 1260 cGy  . Rheumatic fever     PAST SURGICAL HISTORY: Past Surgical History:  Procedure Laterality Date  . BREAST SURGERY    . MASTECTOMY  bilateral  . PORT A CATH REVISION      FAMILY HISTORY Family History  Problem Relation Age of Onset  . Pancreatic cancer Mother   . Kidney cancer Sister 85  . Breast cancer Sister 70  . Breast cancer Other 3  The patient's father died in his 32I  from complications of alcohol abuse. The patient's  mother died in her 68s from pancreatic cancer. The patient has 6 brothers, 2 sisters. Both her sisters have had breast cancer, both diagnosed after the age of 42. The patient has not had genetic testing so far.   GYNECOLOGIC HISTORY: Menarche age 3; first live birth age 68. She is GXP4. She is not sure when she stopped having periods. She never used hormone replacement.  SOCIAL HISTORY: The patient has worked in the past as a Psychologist, counselling. At home in addition to the patient are her husband [name], originally from [place], who works as a Administrator.  Also living in the home is her daughter [name] (she is studying to be a Marine scientist).  The other 2 children are [name], who has a college degree in psychology and works as a Probation officer; and [name], who lives in [place] and works as a Higher education careers adviser. Son, [name] also sometimes stays in the home. In addition the patient has an aid who helps her almost daily.   ADVANCED DIRECTIVES: Not in place  HEALTH MAINTENANCE: Social History  Substance Use Topics  . Smoking status: Light Tobacco Smoker  . Smokeless tobacco: Never Used  . Alcohol use Yes     Comment: Rarely     Colonoscopy:  Not on file  PAP:  Not on file  Bone density:  Not on file  Lipid panel:  Not on file    Allergies  Allergen Reactions  . Pork-Derived Products Other (See Comments)    Pt says she just doesn't eat pork; has no reaction to pork products  . Tramadol Nausea Only  . Hydrocodone-Acetaminophen Itching and Rash    Current Outpatient Prescriptions  Medication Sig Dispense Refill  . alprazolam (XANAX) 2 MG tablet Take 2 mg by mouth daily.     . Ascorbic Acid (VITAMIN C) 100 MG tablet Take 100 mg by mouth daily.      . calcium-vitamin D (OSCAL WITH D) 500-200 MG-UNIT per tablet Take 1 tablet by mouth daily.      . cephALEXin (KEFLEX) 500 MG capsule Take 1 capsule (500 mg total) by mouth 3 (three) times daily. 30 capsule 0  . gabapentin (NEURONTIN) 300 MG capsule Take 1  capsule (300 mg total) by mouth daily. 90 capsule 3  . methadone (DOLOPHINE) 10 MG tablet Take 1 tablet (10 mg total) by mouth every 8 (eight) hours. Take with 5 mg tablet to equal 15 mg every 8 hours 90 tablet 0  . methadone (DOLOPHINE) 5 MG tablet Take 1 tablet (5 mg total) by mouth every 8 (eight) hours. Take with 10 mg tab to equal 15 mg every 8 hours 90 tablet 0  . Multiple Vitamin (MULTIVITAMIN) capsule Take 1 capsule by mouth daily.      . mupirocin nasal ointment (BACTROBAN NASAL) 2 % Place 1 application into the nose 2 (two) times daily. Use one-half of tube in each nostril twice daily for five (5) days. 10 g 0  . oxyCODONE-acetaminophen (PERCOCET) 10-325 MG tablet Take 2 tablets by mouth every 4 (four) hours as needed.  0  . oxymorphone (OPANA) 10 MG tablet Take 1 tablet by  mouth 2 (two) times daily.  0  . potassium chloride (MICRO-K) 10 MEQ CR capsule Take 2 capsules (20 mEq total) by mouth daily. 60 capsule 3  . prochlorperazine (COMPAZINE) 10 MG tablet Take 1 tablet by mouth every 6 (six) hours as needed.    . sertraline (ZOLOFT) 50 MG tablet Take 1 tablet (50 mg total) by mouth daily. 30 tablet 3  . triamterene-hydrochlorothiazide (DYAZIDE) 37.5-25 MG capsule Take 1 each (1 capsule total) by mouth every morning. 30 capsule 0   No current facility-administered medications for this visit.    Facility-Administered Medications Ordered in Other Visits  Medication Dose Route Frequency Provider Last Rate Last Dose  . sodium chloride 0.9 % injection 10 mL  10 mL Intravenous PRN Cosmo Tetreault, [name] Dad, MD   10 mL at 03/04/14 1421    OBJECTIVE: Middle-aged African-American woman   Vitals:   09/05/17 1258  BP: (!) 144/66  Pulse: (!) 53  Resp: 18  Temp: 98.5 F (36.9 C)  SpO2: 100%     Body mass index is 31.33 kg/m.    ECOG FS: 1 Filed Weights   09/05/17 1258  Weight: 194 lb 1.6 oz (88 kg)      Photo 07/04/2017     Photo 04/06/2017    LAB RESULTS:.  Lab Results    Component Value Date   WBC 7.5 09/05/2017   NEUTROABS 4.7 09/05/2017   HGB 12.0 09/05/2017   HCT 38.5 09/05/2017   MCV 86.9 09/05/2017   PLT 239 09/05/2017      Chemistry      Component Value Date/Time   NA 139 08/15/2017 1052   K 3.3 (L) 08/15/2017 1052   CL 105 01/24/2017 0657   CL 101 03/07/2013 1411   CO2 28 08/15/2017 1052   BUN 15.3 08/15/2017 1052   CREATININE 0.8 08/15/2017 1052      Component Value Date/Time   CALCIUM 9.4 08/15/2017 1052   ALKPHOS 106 08/15/2017 1052   AST 19 08/15/2017 1052   ALT 11 08/15/2017 1052   BILITOT 0.31 08/15/2017 1052       STUDIES: Dg Hand Complete Left  Addendum Date: 08/12/2017   ADDENDUM REPORT: 08/12/2017 14:10 ADDENDUM: Voice recognition error in the impression. Impression should read as follows: No acute osseous injury of the left hand. Electronically Signed   By: Kathreen Devoid   On: 08/12/2017 14:10   Result Date: 08/12/2017 CLINICAL DATA:  Patient states that she fell while mopping her floor 3 days ago, pain and swelling in the left hand. EXAM: LEFT HAND - COMPLETE 3+ VIEW COMPARISON:  None. FINDINGS: No acute fracture or dislocation. Mild osteoarthritis of the first [facility] joint and first IP joint. Mild osteoarthritis of the DIP joints. Mild osteoarthritis of the first MCP joint, second MCP joint and third MCP joint. No aggressive osseous lesion. Severe soft tissue swelling along the dorsal aspect of the left hand. IMPRESSION: 1.  No acute osseous injury of the left and. Electronically Signed: By: Kathreen Devoid On: 08/12/2017 14:07   ASSESSMENT:  61 y.o. [place] woman with stage IV breast cancer manifested chiefly by loco-regional nodal disease (neck, chest) and skin involvement, without liver, bone, or brain metastases documented   (1) Status post right upper inner quadrant lumpectomy and sentinel lymph node sampling 03/04/2002 for 2 separate foci of invasive ductal carcinoma, mpT2 pN1 or stage IIB, both foci grade 3, both  estrogen and progesterone receptor positive, both HercepTest 3+, Mib-1 56%  (2) Reexcision for  margins 05/27/2002 showed no residual cancer in the breast.  (3) The patient refused adjuvant systemic therapy.  (4) Adjuvant radiation treatment completed 08/14/2002.  (5) recurrence in the right breast in 02/2004 showing a morphologically different tumor, again grade 3, again estrogen and progesterone receptor negative, with an MIB-1 of 14% and Herceptest 3+.  (5) Between 03/2004 and 07/2004 she received dose dense Doxorubicin/Cyclophosphamide x 4 given with trastuzumab, followed by weekly Docetaxel x 8, again given with trastuzumab.  (6) Right mastectomy 07/13/2004 showed scattered microscopic foci of residual disease over an area  greater than 5 cm. Margins were negative.  (7) Postoperative Docetaxel continued until 09/2004.  (8) Trastuzumab (Herceptin) given 08/2004 through 01/2012 with some brief interruptions.  (9) Isolated right cervical nodal recurrence 10/2005, treated with radiation to the right supraclavicular area (total 41.5 gray) completed 12/29/2005.  (10) Navelbine given together with Herceptin  11/2005 through 03/2006.  (9) Left mastectomy 02/13/2006 for ductal carcinoma in situ, grade 2, estrogen and progesterone receptor negative, with negative margins; 0 of 3 lymph nodes involved  (10) treated with Lapatinib and Capecitabine before 10/2009, for an unclear duration and with unclear results (cannot locate data on chart review).  (11) Status post right supraclavicular lymph node biopsy 09/2010 again positive for an invasive ductal carcinoma, estrogen and progesterone receptor negative, HER-2 positive by [facility] with a ratio 4.25.  (12) Navelbine given together with Herceptin between 05/2011 and 11/2011.  (13) Carboplatin/ Gemcitabine/ Herceptin given for 2 cycles, in 12/2011 and 01/2012.  (14) TDM-1 (Kadcyla) started 02/2012. Last dose 10/02/2013 after which the patient  discontinued treatment at her own discretion. Echo on December 2014 showed a well preserved ejection fraction.  (15) Deep vein thrombosis of the right upper extremity documented 04/20/2011.  She completed anticoagulant therapy with Coumadin on 03/25/2013.  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 Left chest Port-A-Cath placed on 04/08/2013; being flushed every 6 weeks  RIGHT UPPER EXTREMITY ANGIOSARCOMA VS BREAST CANCER: August 2016 (18) treated at [facility] [place] August 2016 with paclitaxel, trastuzumab and pertuzumab 1 cycle, afterwards referred to hospice  (19) under the care of hospice of [facility] August 2016 to 05/08/2016, when the patient opted for a second try at chemotherapy  (20) started low-dose Abraxane, trastuzumab and pertuzumab 06/07/2016, to be repeated every 3 weeks.   (a) pretreatment echocardiogram 06/06/2016 showed a 60-65% ejection fraction  (b) significant compliance problems compromise response to treatment  (c) last echocardiogram 02/20/17 LVEF 55-60%  (d) Abraxane discontinued 07/04/2017 because of neuropathy symptoms  (e) Gemzar every 21 days started 07/04/2017  (21) Chronic pain secondary to known metastatic disease  (a) patient signed pain contract 10/19/2016  (b) as of 11/16/2016 receives her pain medications from Dr [name]  (c) we prescribe methadone to the patient but no other narcotics   PLAN: Quanita did not show today. Letter has been sent.  Manami Tutor, [name] Dad, MD  09/05/17 1:11 PM Medical Oncology and Hematology [facility] [address] [place], [place] [address] Tel. [phone]    Fax. [phone]  This document serves as a record of services personally performed by Lurline Del, MD. It was created on his behalf by Margit Banda, a trained medical scribe. The creation of this record is based on the scribe's personal observations and  the provider's statements to them. This document has been checked and approved by the attending provider.

## 2017-09-05 ENCOUNTER — Ambulatory Visit (HOSPITAL_BASED_OUTPATIENT_CLINIC_OR_DEPARTMENT_OTHER): Payer: Commercial Managed Care - PPO | Admitting: Oncology

## 2017-09-05 ENCOUNTER — Other Ambulatory Visit (HOSPITAL_BASED_OUTPATIENT_CLINIC_OR_DEPARTMENT_OTHER): Payer: Commercial Managed Care - PPO

## 2017-09-05 ENCOUNTER — Other Ambulatory Visit: Payer: Self-pay | Admitting: *Deleted

## 2017-09-05 ENCOUNTER — Ambulatory Visit: Payer: Commercial Managed Care - PPO

## 2017-09-05 ENCOUNTER — Telehealth: Payer: Self-pay | Admitting: Oncology

## 2017-09-05 ENCOUNTER — Ambulatory Visit (HOSPITAL_BASED_OUTPATIENT_CLINIC_OR_DEPARTMENT_OTHER): Payer: Commercial Managed Care - PPO

## 2017-09-05 VITALS — BP 144/66 | HR 53 | Temp 98.5°F | Resp 18 | Ht 66.0 in | Wt 194.1 lb

## 2017-09-05 DIAGNOSIS — Z170 Estrogen receptor positive status [ER+]: Secondary | ICD-10-CM | POA: Diagnosis not present

## 2017-09-05 DIAGNOSIS — Z7189 Other specified counseling: Secondary | ICD-10-CM | POA: Insufficient documentation

## 2017-09-05 DIAGNOSIS — C50211 Malignant neoplasm of upper-inner quadrant of right female breast: Secondary | ICD-10-CM

## 2017-09-05 DIAGNOSIS — Z5112 Encounter for antineoplastic immunotherapy: Secondary | ICD-10-CM

## 2017-09-05 DIAGNOSIS — Z95828 Presence of other vascular implants and grafts: Secondary | ICD-10-CM

## 2017-09-05 DIAGNOSIS — C778 Secondary and unspecified malignant neoplasm of lymph nodes of multiple regions: Secondary | ICD-10-CM

## 2017-09-05 DIAGNOSIS — C4911 Malignant neoplasm of connective and soft tissue of right upper limb, including shoulder: Secondary | ICD-10-CM | POA: Diagnosis not present

## 2017-09-05 DIAGNOSIS — Z5111 Encounter for antineoplastic chemotherapy: Secondary | ICD-10-CM | POA: Diagnosis not present

## 2017-09-05 LAB — COMPREHENSIVE METABOLIC PANEL
ALT: 10 U/L (ref 0–55)
AST: 19 U/L (ref 5–34)
Albumin: 3.6 g/dL (ref 3.5–5.0)
Alkaline Phosphatase: 111 U/L (ref 40–150)
Anion Gap: 10 mEq/L (ref 3–11)
BUN: 19.2 mg/dL (ref 7.0–26.0)
CO2: 29 mEq/L (ref 22–29)
Calcium: 9.5 mg/dL (ref 8.4–10.4)
Chloride: 100 mEq/L (ref 98–109)
Creatinine: 0.8 mg/dL (ref 0.6–1.1)
EGFR: >60 mL/min/{1.73_m2} (ref >60)
Glucose: 122 mg/dl (ref 70–140)
Potassium: 4.1 mEq/L (ref 3.5–5.1)
Sodium: 139 mEq/L (ref 136–145)
Total Bilirubin: <0.22 mg/dL (ref 0.20–1.20)
Total Protein: 7.9 g/dL (ref 6.4–8.3)

## 2017-09-05 LAB — CBC WITH DIFFERENTIAL/PLATELET
BASO%: 0.5 % (ref 0.0–2.0)
Basophils Absolute: 0 10*3/uL (ref 0.0–0.1)
EOS%: 2.7 % (ref 0.0–7.0)
Eosinophils Absolute: 0.2 10*3/uL (ref 0.0–0.5)
HCT: 38.5 % (ref 34.8–46.6)
HGB: 12 g/dL (ref 11.6–15.9)
LYMPH%: 25.7 % (ref 14.0–49.7)
MCH: 27.1 pg (ref 25.1–34.0)
MCHC: 31.2 g/dL — ABNORMAL LOW (ref 31.5–36.0)
MCV: 86.9 fL (ref 79.5–101.0)
MONO#: 0.6 10*3/uL (ref 0.1–0.9)
MONO%: 8.5 % (ref 0.0–14.0)
NEUT#: 4.7 10*3/uL (ref 1.5–6.5)
NEUT%: 62.6 % (ref 38.4–76.8)
Platelets: 239 10*3/uL (ref 145–400)
RBC: 4.43 10*6/uL (ref 3.70–5.45)
RDW: 15.1 % — ABNORMAL HIGH (ref 11.2–14.5)
WBC: 7.5 10*3/uL (ref 3.9–10.3)
lymph#: 1.9 10*3/uL (ref 0.9–3.3)
nRBC: 0 % (ref 0–0)

## 2017-09-05 MED ORDER — PROCHLORPERAZINE MALEATE 10 MG PO TABS
ORAL_TABLET | ORAL | Status: AC
  Filled 2017-09-05: qty 1

## 2017-09-05 MED ORDER — SODIUM CHLORIDE 0.9 % IJ SOLN
10.0000 mL | INTRAMUSCULAR | Status: DC | PRN
  Administered 2017-09-05: 10 mL via INTRAVENOUS
  Filled 2017-09-05: qty 10

## 2017-09-05 MED ORDER — PACLITAXEL PROTEIN-BOUND CHEMO INJECTION 100 MG
100.0000 mg/m2 | Freq: Once | INTRAVENOUS | Status: AC
  Administered 2017-09-05: 200 mg via INTRAVENOUS
  Filled 2017-09-05: qty 40

## 2017-09-05 MED ORDER — HEPARIN SOD (PORK) LOCK FLUSH 100 UNIT/ML IV SOLN
500.0000 [IU] | Freq: Once | INTRAVENOUS | Status: AC | PRN
  Administered 2017-09-05: 500 [IU]
  Filled 2017-09-05: qty 5

## 2017-09-05 MED ORDER — ACETAMINOPHEN 325 MG PO TABS
650.0000 mg | ORAL_TABLET | Freq: Once | ORAL | Status: AC
  Administered 2017-09-05: 650 mg via ORAL

## 2017-09-05 MED ORDER — SODIUM CHLORIDE 0.9 % IV SOLN
Freq: Once | INTRAVENOUS | Status: AC
  Administered 2017-09-05: 14:00:00 via INTRAVENOUS

## 2017-09-05 MED ORDER — PROCHLORPERAZINE EDISYLATE 5 MG/ML IJ SOLN
INTRAMUSCULAR | Status: AC
Start: 2017-09-05 — End: 2017-09-05
  Filled 2017-09-05: qty 2

## 2017-09-05 MED ORDER — SODIUM CHLORIDE 0.9 % IV SOLN
450.0000 mg | Freq: Once | INTRAVENOUS | Status: AC
  Administered 2017-09-05: 450 mg via INTRAVENOUS
  Filled 2017-09-05: qty 21.43

## 2017-09-05 MED ORDER — ACETAMINOPHEN 325 MG PO TABS
ORAL_TABLET | ORAL | Status: AC
  Filled 2017-09-05: qty 2

## 2017-09-05 MED ORDER — PROCHLORPERAZINE MALEATE 10 MG PO TABS
10.0000 mg | ORAL_TABLET | Freq: Once | ORAL | Status: AC
  Administered 2017-09-05: 10 mg via ORAL

## 2017-09-05 MED ORDER — DIPHENHYDRAMINE HCL 25 MG PO CAPS
ORAL_CAPSULE | ORAL | Status: AC
  Filled 2017-09-05: qty 1

## 2017-09-05 MED ORDER — DIPHENHYDRAMINE HCL 25 MG PO CAPS
25.0000 mg | ORAL_CAPSULE | Freq: Once | ORAL | Status: AC
  Administered 2017-09-05: 25 mg via ORAL

## 2017-09-05 MED ORDER — SODIUM CHLORIDE 0.9% FLUSH
10.0000 mL | INTRAVENOUS | Status: DC | PRN
  Administered 2017-09-05: 10 mL
  Filled 2017-09-05: qty 10

## 2017-09-05 NOTE — Patient Instructions (Signed)

## 2017-09-05 NOTE — Patient Instructions (Signed)
[facility] Discharge Instructions for Patients Receiving Chemotherapy  Today you received the following chemotherapy agents: paclitaxel-protein bound (Abraxane), Trastuzumab (herceptin).  To help prevent nausea and vomiting after your treatment, we encourage you to take your nausea medication as direcred.    If you develop nausea and vomiting that is not controlled by your nausea medication, call the clinic.   BELOW ARE SYMPTOMS THAT SHOULD BE REPORTED IMMEDIATELY:  *FEVER GREATER THAN 100.5 F  *CHILLS WITH OR WITHOUT FEVER  NAUSEA AND VOMITING THAT IS NOT CONTROLLED WITH YOUR NAUSEA MEDICATION  *UNUSUAL SHORTNESS OF BREATH  *UNUSUAL BRUISING OR BLEEDING  TENDERNESS IN MOUTH AND THROAT WITH OR WITHOUT PRESENCE OF ULCERS  *URINARY PROBLEMS  *BOWEL PROBLEMS  UNUSUAL RASH Items with * indicate a potential emergency and should be followed up as soon as possible.  Feel free to call the clinic you have any questions or concerns. The clinic phone number is [phone].  Please show the [place] at check-in to the Emergency Department and triage nurse.

## 2017-09-05 NOTE — Progress Notes (Addendum)
DISCONTINUE OFF PATHWAY REGIMEN - Breast   [id number]:Gemcitabine 1,000 mg/m2 D1, 8  q21 Days:   A cycle is every 21 days:     Gemcitabine   **Always confirm dose/schedule in your pharmacy ordering system**    REASON: Other Reason PRIOR TREATMENT: Off Pathway: Gemcitabine 1,000 mg/m2 D1, 8  q21 Days TREATMENT RESPONSE: Unable to Evaluate  START OFF PATHWAY REGIMEN - Breast   OFF00032:Nab-Paclitaxel (Abraxane(R)) 100 mg/m2 D1,8,15 q28 days:   A cycle is every 28 days (3 weeks on and 1 week off):     Nab-paclitaxel (protein bound)   **Always confirm dose/schedule in your pharmacy ordering system**    Patient Characteristics: Metastatic Chemotherapy, HER2 Positive, ER Negative/Unknown, Fourth Line and Beyond Therapeutic Status: Distant Metastases BRCA Mutation Status: Did Not Order Test ER Status: Negative (-) HER2 Status: Positive (+) Would you be surprised if this patient died  in the next year<= I would be surprised if this patient died in the next year PR Status: Negative (-) Line of therapy: Fourth Line and Beyond Intent of Therapy: Non-Curative / Palliative Intent, Discussed with Patient [place]  [phone] [phone]  OFFICE PROGRESS NOTE   ID: [name]   DOB: 05-29-1956  MR#: [id number]  LKT#:[id number]   PCP: [name], MD [name], [name], [name] RAD DSK:[id number] [name], [name] 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  She sought attention and had a mammogram which showed an obvious carcinoma in the upper inner quadrant of the right breast,  approximately 2 cm.  There were some enlarged lymph nodes in the axilla and an FNA done showed those consistent with malignant cells, most likely an invasive ductal carcinoma.  At that point she was unsure of what to do and was referred by Dr. [name] for a discussion of her treatment options.  By biopsy, it was ER/PR negative and HER-2 was 3+.  DNA index was 1.42.  We reiterated that it was most important to have her disease surgically addressed at that point and had recommended lumpectomy and axillary nodes to be addressed with which she followed through and on 04-03-02, Dr. [name] performed a right partial mastectomy and a right axillary lymph node dissection.  Final pathology revealed a 2.4 cm. high grade, Grade III invasive ductal carcinoma with an adjacent .8 cm. also high grade invasive ductal carcinoma which was felt to represent an intramammary metastasis rather than a second primary.  A smaller mass was just medial to the larger mass.  The smaller mass was associated with high grade DCIS component.  There was no definite lymphovascular invasion identified.  However, one of fourteen axillary lymph nodes did contain a 1.5 cm. metastatic deposit.  Postoperatively she did very well.  We reiterated the fact that she did need adjuvant chemotherapy, however, she refused and decided that she would pursue radiation.  She received radiation and completed that on 08-14-02. Her subsequent history is as detailed below.   INTERVAL HISTORY: [place] returns today for follow-up of her HER-2 positive breast cancer.  At the last visit we had changed her to Gemzar, but she refused to have that treatment, did not show  for the last visit and today she tells me that she does not want to try that medication, she wants to stay on Abraxane  She tells me she read the side effects of Gemzar and it can cause swelling and she does not one swelling.  We discussed this further but she tells me her "spirit tells me not to take  it".  She just wants to continue on the treatment she was receiving previously  REVIEW OF SYSTEMS: She tells me she is no longer having any peripheral neuropathy symptoms.  She never had it in her hand she says, and what she was feeling in her feet is now gone because she is walking on a regular basis.  She does have back pain, in the upper right back, which is where she has all her skin and soft tissue lesions, but she says it is more in the bones.  It comes and goes, although it tends to be worse in the evening it is not clear whether it is worse when she lies down.  She denies any unusual headaches visual changes nausea vomiting dizziness or cough phlegm production or shortness of breath there is been no change in bowel or bladder habits.  A detailed review of systems today was stable.  PAST MEDICAL HISTORY: Past Medical History:  Diagnosis Date  . Breast cancer ([place])   . DVT (deep venous thrombosis) ([place]) 10/07/2011  . Hypertension   . Radiation 11/29/2005-12/29/2005   right supraclavicular area 4147 cGy  . Radiation 06/26/02-08/14/02   right breast 5040 cGy, tumor bed boosted to 1260 cGy  . Rheumatic fever     PAST SURGICAL HISTORY: Past Surgical History:  Procedure Laterality Date  . BREAST SURGERY    . MASTECTOMY     bilateral  . PORT A CATH REVISION      FAMILY HISTORY Family History  Problem Relation Age of Onset  . Pancreatic cancer Mother   . Kidney cancer Sister 89  . Breast cancer Sister 64  . Breast cancer Other 47  The patient's father died in his 91Y  from complications of alcohol abuse. The patient's mother died in her 47s from pancreatic cancer. The patient has 6 brothers, 2 sisters. Both her sisters have had breast cancer, both diagnosed after the age of 43. The patient has not had genetic testing so far.   GYNECOLOGIC HISTORY: Menarche age 74; first live birth age 47. She is GXP4. She is not sure when she stopped having periods. She never used hormone  replacement.  SOCIAL HISTORY: The patient has worked in the past as a Psychologist, counselling. At home in addition to the patient are her husband [name], originally from [place], who works as a Administrator.  Also living in the home is her daughter [name] (she is studying to be a Marine scientist).  The other 2 children are [name], who has a college degree in psychology and works as a Probation officer; and [name], who lives in [place] and works as a Higher education careers adviser.[name] also sometimes stays in the home. In addition the patient has an aid who helps her almost daily.   ADVANCED DIRECTIVES: Not in place  HEALTH MAINTENANCE: Social History  Substance Use Topics  . Smoking status: Light Tobacco Smoker  . Smokeless tobacco: Never Used  . Alcohol use Yes     Comment: Rarely     Colonoscopy:  Not on file  PAP:  Not on file  Bone density:  Not on file  Lipid panel:  Not on file    Allergies  Allergen Reactions  . Pork-Derived Products Other (See Comments)    Pt says she just doesn't eat pork; has no reaction to pork products  . Tramadol Nausea Only  . Hydrocodone-Acetaminophen Itching and Rash    Current Outpatient Prescriptions  Medication Sig Dispense Refill  . alprazolam (XANAX) 2 MG tablet Take 2 mg by mouth daily.     . Ascorbic Acid (VITAMIN C) 100 MG tablet Take 100 mg by mouth daily.      . calcium-vitamin D (OSCAL WITH D) 500-200 MG-UNIT per tablet Take 1 tablet by mouth daily.      . cephALEXin (KEFLEX) 500 MG capsule Take 1 capsule (500 mg total) by mouth 3 (three) times daily. 30 capsule 0  . gabapentin (NEURONTIN) 300 MG capsule Take 1 capsule (300 mg total) by mouth daily. 90 capsule 3  . methadone (DOLOPHINE) 10 MG tablet Take 1 tablet (10 mg total) by mouth every 8 (eight) hours. Take with 5 mg tablet to equal 15 mg every 8 hours 90 tablet 0  . methadone (DOLOPHINE) 5 MG tablet Take 1 tablet (5 mg total) by mouth every 8 (eight) hours. Take with 10 mg tab to equal 15 mg every 8 hours 90  tablet 0  . Multiple Vitamin (MULTIVITAMIN) capsule Take 1 capsule by mouth daily.      . mupirocin nasal ointment (BACTROBAN NASAL) 2 % Place 1 application into the nose 2 (two) times daily. Use one-half of tube in each nostril twice daily for five (5) days. 10 g 0  . oxyCODONE-acetaminophen (PERCOCET) 10-325 MG tablet Take 2 tablets by mouth every 4 (four) hours as needed.  0  . oxymorphone (OPANA) 10 MG tablet Take 1 tablet by mouth 2 (two) times daily.  0  . potassium chloride (MICRO-K) 10 MEQ CR capsule Take 2 capsules (20 mEq total) by mouth daily. 60 capsule 3  . prochlorperazine (COMPAZINE) 10 MG tablet Take 1 tablet by mouth every 6 (six) hours as needed.    . sertraline (ZOLOFT) 50 MG tablet Take 1 tablet (50 mg total) by mouth daily. 30 tablet 3  . triamterene-hydrochlorothiazide (DYAZIDE) 37.5-25 MG capsule Take 1 each (1 capsule total) by mouth every morning. 30 capsule 0   No current facility-administered medications for this visit.    Facility-Administered Medications Ordered in Other Visits  Medication Dose Route Frequency Provider Last Rate Last Dose  . sodium chloride 0.9 % injection 10 mL  10 mL Intravenous PRN [name], [name], MD   10 mL at 03/04/14 1421    OBJECTIVE: Middle-aged African-American woman who appears stated age  18:   09/05/17 1258  BP: (!) 144/66  Pulse: (!) 53  Resp: 18  Temp: 98.5 F (36.9 C)  SpO2: 100%     Body mass index is 31.33 kg/m.    ECOG FS: 1 Filed Weights   09/05/17 1258  Weight: 194 lb 1.6 oz (88 kg)   Sclerae unicteric, EOMs intact Oropharynx clear and moist No cervical or supraclavicular adenopathy Lungs no rales or rhonchi Heart regular rate and rhythm Abd soft, nontender, positive bowel sounds MSK minimal  spinal tenderness over the right scapular area, right upper extremity lymphedema, sleeve in place Neuro: nonfocal, well oriented, appropriate affect Breasts: Deferred Skin: The back lesions are imaged below and  are essentially unchanged   Photo 07/04/2017     Photo 04/06/2017    LAB RESULTS:.  Lab Results  Component Value Date  WBC 7.5 09/05/2017   NEUTROABS 4.7 09/05/2017   HGB 12.0 09/05/2017   HCT 38.5 09/05/2017   MCV 86.9 09/05/2017   PLT 239 09/05/2017      Chemistry      Component Value Date/Time   NA 139 09/05/2017 1154   K 4.1 09/05/2017 1154   CL 105 01/24/2017 0657   CL 101 03/07/2013 1411   CO2 29 09/05/2017 1154   BUN 19.2 09/05/2017 1154   CREATININE 0.8 09/05/2017 1154      Component Value Date/Time   CALCIUM 9.5 09/05/2017 1154   ALKPHOS 111 09/05/2017 1154   AST 19 09/05/2017 1154   ALT 10 09/05/2017 1154   BILITOT <0.22 09/05/2017 1154       STUDIES: Most recent scans was a 02-02-2017 CT which showed no pulmonary embolism, small left effusion, and radiation changes in the right lung.Chest x-ray March 1928 showed her left-sided pacemaker unchanged in position and a stable left midlung opacity.   ASSESSMENT:  61 y.o. [place] woman with stage IV breast cancer manifested chiefly by loco-regional nodal disease (neck, chest) and skin involvement, without liver, bone, or brain metastases documented   (1) Status post right upper inner quadrant lumpectomy and sentinel lymph node sampling 03/04/2002 for 2 separate foci of invasive ductal carcinoma, mpT2 pN1 or stage IIB, both foci grade 3, both estrogen and progesterone receptor positive, both HercepTest 3+, Mib-1 56%  (2) Reexcision for margins 05/27/2002 showed no residual cancer in the breast.  (3) The patient refused adjuvant systemic therapy.  (4) Adjuvant radiation treatment completed 08/14/2002.  (5) recurrence in the right breast in 02/2004 showing a morphologically different tumor, again grade 3, again estrogen and progesterone receptor negative, with an MIB-1 of 14% and Herceptest 3+.  (5) Between 03/2004 and 07/2004 she received dose dense Doxorubicin/Cyclophosphamide x 4 given with  trastuzumab, followed by weekly Docetaxel x 8, again given with trastuzumab.  (6) Right mastectomy 07/13/2004 showed scattered microscopic foci of residual disease over an area  greater than 5 cm. Margins were negative.  (7) Postoperative Docetaxel continued until 09/2004.  (8) Trastuzumab (Herceptin) given 08/2004 through 01/2012 with some brief interruptions.  (9) Isolated right cervical nodal recurrence 10/2005, treated with radiation to the right supraclavicular area (total 41.5 gray) completed 12/29/2005.  (10) Navelbine given together with Herceptin  11/2005 through 03/2006.  (9) Left mastectomy 02/13/2006 for ductal carcinoma in situ, grade 2, estrogen and progesterone receptor negative, with negative margins; 0 of 3 lymph nodes involved  (10) treated with Lapatinib and Capecitabine before 10/2009, for an unclear duration and with unclear results (cannot locate data on chart review).  (11) Status post right supraclavicular lymph node biopsy 09/2010 again positive for an invasive ductal carcinoma, estrogen and progesterone receptor negative, HER-2 positive by [facility] with a ratio 4.25.  (12) Navelbine given together with Herceptin between 05/2011 and 11/2011.  (13) Carboplatin/ Gemcitabine/ Herceptin given for 2 cycles, in 12/2011 and 01/2012.  (14) TDM-1 ([place]) started 02/2012. Last dose 10/02/2013 after which the patient discontinued treatment at her own discretion. Echo on December 2014 showed a well preserved ejection fraction.  (15) Deep vein thrombosis of the right upper extremity documented 04/20/2011.  She completed anticoagulant therapy with Coumadin on 03/25/2013.  (16) Chronic right upper extremity lymphedema, not responsive to aggressive PT  (a) biopsy of denuded area 04/23/2015 read as dermatofibroma (discordant)  (b) deeper cuts of 04/23/2015 biopsy suggest angiosarcoma  (Feb 03, 2023) Right chest port-a-cath removal due to infection on 01/28/2013. Left chest  Port-A-Cath  placed on 04/08/2013; being flushed every 6 weeks  RIGHT UPPER EXTREMITY ANGIOSARCOMA VS BREAST CANCER: August 2016 (18) treated at [facility] [place] August 2016 with paclitaxel, trastuzumab and pertuzumab 1 cycle, afterwards referred to hospice  (19) under the care of hospice of [facility] August 2016 to 05/08/2016, when the patient opted for a second try at chemotherapy  (20) started low-dose Abraxane, trastuzumab and pertuzumab 06/07/2016, to be repeated every 3 weeks.   (a) pretreatment echocardiogram 06/06/2016 showed a 60-65% ejection fraction  (b) significant compliance problems compromise response to treatment  (c) last echocardiogram 02/20/17 LVEF 55-60%  (d) Abraxane discontinued 07/04/2017 because of neuropathy symptoms  (e) Gemzar every 21 days started 07/04/2017  (21) Chronic pain secondary to known metastatic disease  (a) patient signed pain contract 10/19/2016  (b) as of 11/16/2016 receives her pain medications from Dr [name]  (c) we prescribe methadone to the patient but no other narcotics   PLAN: Shahrzad is stable from a cancer point of view except that she reports a new back pain.  She has not wanted to undergo CT scans because she is afraid of "bad news".  I told her she might actually get some good news and I have written her for a PET scan which he tentatively agrees to.  She does not have any neurologic signs however and specifically denies leg weakness or change in bowel or bladder habits  I discussed the peripheral neuropathy issue in detail.  Some of my patients are so motivated that they deny or height symptoms.  I explained if she does have neuropathy and continues to receive taxanes she will not be able to walk.  However she tells me that the peripheral neuropathy has completely resolved.  She also refuses to give the Gemzar a try even though I reassured her that swelling is a very rare side effect of that medication  Accordingly were going back on  Abraxane as before.  She will continue on Pertuzumab and trastuzumab.  I am setting her up for a PET scan and she will see [name] again in 6 weeks.  She knows to call for any problems that may develop before that visit  [name], [name], MD  09/05/17 1:40 PM Medical Oncology and Hematology [facility] [place], [place] [address] Tel. [phone]    Fax. [phone]  This document serves as a record of services personally performed by Lurline Del, MD. It was created on her behalf by Steva Colder, a trained medical scribe. The creation of this record is based on the scribe's personal observations and the provider's statements to them. This document has been checked and approved by the attending provider.

## 2017-09-05 NOTE — Telephone Encounter (Signed)
Gave patient avs and calendar with appt per 10/30 los.

## 2017-09-06 ENCOUNTER — Other Ambulatory Visit: Payer: Self-pay

## 2017-09-06 MED ORDER — GABAPENTIN 300 MG PO CAPS: 300.0000 mg | ORAL_CAPSULE | Freq: Every day | ORAL | 3 refills | Status: DC

## 2017-09-07 ENCOUNTER — Other Ambulatory Visit: Payer: Self-pay | Admitting: *Deleted

## 2017-09-07 MED ORDER — METHADONE HCL 5 MG PO TABS: 5.0000 mg | ORAL_TABLET | Freq: Three times a day (TID) | ORAL | 0 refills | Status: DC

## 2017-09-07 MED ORDER — METHADONE HCL 10 MG PO TABS: 10.0000 mg | ORAL_TABLET | Freq: Three times a day (TID) | ORAL | 0 refills | Status: DC

## 2017-09-08 ENCOUNTER — Encounter: Payer: Self-pay | Admitting: Oncology

## 2017-09-08 ENCOUNTER — Other Ambulatory Visit: Payer: Self-pay | Admitting: *Deleted

## 2017-09-08 ENCOUNTER — Telehealth: Payer: Self-pay | Admitting: *Deleted

## 2017-09-08 MED ORDER — METHADONE HCL 10 MG PO TABS: 20.0000 mg | ORAL_TABLET | Freq: Two times a day (BID) | ORAL | 0 refills | Status: DC

## 2017-09-08 NOTE — Progress Notes (Signed)
Received PA request for Methadone from [facility] RN requested by pharmacy.  Called [facility](Jeannie) to initiate. She states she will submit request for processing.

## 2017-09-11 ENCOUNTER — Encounter: Payer: Self-pay | Admitting: Oncology

## 2017-09-11 NOTE — Telephone Encounter (Signed)
This RN received call from [facility]- stating prescription for methadone was " early " - with noted dispense amount of 90 tabs on 10/11.  Per review - patient is actually taking 2 of the 10 mg tabs bid - vs 15 mg tid.  Per [name] " the 5's don't seem to work good "  New prescription given for current dose of 20 mg bid.  Per new prescription - request for prior authorization.  Note total methadone dose per currently is 40 mg daily vs prior total dose of 45 mg .  Request sent to managed care per above including faxed request.

## 2017-09-11 NOTE — Progress Notes (Signed)
Pt's Methadone was approved through 11/06/18.

## 2017-09-20 ENCOUNTER — Ambulatory Visit (HOSPITAL_COMMUNITY): Payer: Commercial Managed Care - PPO

## 2017-09-23 ENCOUNTER — Other Ambulatory Visit: Payer: Self-pay | Admitting: Oncology

## 2017-09-27 ENCOUNTER — Other Ambulatory Visit (HOSPITAL_BASED_OUTPATIENT_CLINIC_OR_DEPARTMENT_OTHER): Payer: Commercial Managed Care - PPO

## 2017-09-27 ENCOUNTER — Ambulatory Visit (HOSPITAL_BASED_OUTPATIENT_CLINIC_OR_DEPARTMENT_OTHER): Payer: Commercial Managed Care - PPO

## 2017-09-27 VITALS — BP 104/57 | HR 81 | Resp 16 | Wt 193.0 lb

## 2017-09-27 DIAGNOSIS — C50211 Malignant neoplasm of upper-inner quadrant of right female breast: Secondary | ICD-10-CM

## 2017-09-27 DIAGNOSIS — C778 Secondary and unspecified malignant neoplasm of lymph nodes of multiple regions: Secondary | ICD-10-CM

## 2017-09-27 DIAGNOSIS — C4911 Malignant neoplasm of connective and soft tissue of right upper limb, including shoulder: Secondary | ICD-10-CM

## 2017-09-27 DIAGNOSIS — Z170 Estrogen receptor positive status [ER+]: Secondary | ICD-10-CM

## 2017-09-27 DIAGNOSIS — Z5112 Encounter for antineoplastic immunotherapy: Secondary | ICD-10-CM | POA: Diagnosis not present

## 2017-09-27 LAB — CBC WITH DIFFERENTIAL/PLATELET
BASO%: 0.7 % (ref 0.0–2.0)
Basophils Absolute: 0 10*3/uL (ref 0.0–0.1)
EOS%: 2.2 % (ref 0.0–7.0)
Eosinophils Absolute: 0.1 10*3/uL (ref 0.0–0.5)
HCT: 38 % (ref 34.8–46.6)
HGB: 11.9 g/dL (ref 11.6–15.9)
LYMPH%: 30 % (ref 14.0–49.7)
MCH: 26.8 pg (ref 25.1–34.0)
MCHC: 31.3 g/dL — ABNORMAL LOW (ref 31.5–36.0)
MCV: 85.6 fL (ref 79.5–101.0)
MONO#: 0.6 10*3/uL (ref 0.1–0.9)
MONO%: 10 % (ref 0.0–14.0)
NEUT#: 3.5 10*3/uL (ref 1.5–6.5)
NEUT%: 57.1 % (ref 38.4–76.8)
Platelets: 226 10*3/uL (ref 145–400)
RBC: 4.44 10*6/uL (ref 3.70–5.45)
RDW: 15.1 % — ABNORMAL HIGH (ref 11.2–14.5)
WBC: 6 10*3/uL (ref 3.9–10.3)
lymph#: 1.8 10*3/uL (ref 0.9–3.3)

## 2017-09-27 LAB — COMPREHENSIVE METABOLIC PANEL
ALT: 9 U/L (ref 0–55)
AST: 21 U/L (ref 5–34)
Albumin: 3.9 g/dL (ref 3.5–5.0)
Alkaline Phosphatase: 106 U/L (ref 40–150)
Anion Gap: 10 mEq/L (ref 3–11)
BUN: 22.9 mg/dL (ref 7.0–26.0)
CO2: 27 mEq/L (ref 22–29)
Calcium: 9.2 mg/dL (ref 8.4–10.4)
Chloride: 101 mEq/L (ref 98–109)
Creatinine: 1.2 mg/dL — ABNORMAL HIGH (ref 0.6–1.1)
EGFR: 54 mL/min/{1.73_m2} — ABNORMAL LOW (ref >60)
Glucose: 112 mg/dl (ref 70–140)
Potassium: 3.5 mEq/L (ref 3.5–5.1)
Sodium: 137 mEq/L (ref 136–145)
Total Bilirubin: 0.22 mg/dL (ref 0.20–1.20)
Total Protein: 8.1 g/dL (ref 6.4–8.3)

## 2017-09-27 MED ORDER — DIPHENHYDRAMINE HCL 25 MG PO CAPS
ORAL_CAPSULE | ORAL | Status: AC
  Filled 2017-09-27: qty 1

## 2017-09-27 MED ORDER — ACETAMINOPHEN 325 MG PO TABS
ORAL_TABLET | ORAL | Status: AC
  Filled 2017-09-27: qty 2

## 2017-09-27 MED ORDER — PACLITAXEL PROTEIN-BOUND CHEMO INJECTION 100 MG
100.0000 mg/m2 | Freq: Once | Status: AC
  Administered 2017-09-27: 200 mg via INTRAVENOUS
  Filled 2017-09-27: qty 40

## 2017-09-27 MED ORDER — TRASTUZUMAB CHEMO 150 MG IV SOLR
450.0000 mg | Freq: Once | INTRAVENOUS | Status: AC
  Administered 2017-09-27: 450 mg via INTRAVENOUS
  Filled 2017-09-27: qty 21.43

## 2017-09-27 MED ORDER — ACETAMINOPHEN 325 MG PO TABS
650.0000 mg | ORAL_TABLET | Freq: Once | ORAL | Status: AC
  Administered 2017-09-27: 650 mg via ORAL

## 2017-09-27 MED ORDER — SODIUM CHLORIDE 0.9 % IV SOLN
Freq: Once | INTRAVENOUS | Status: AC
  Administered 2017-09-27: 11:00:00 via INTRAVENOUS

## 2017-09-27 MED ORDER — SODIUM CHLORIDE 0.9 % IV SOLN: Freq: Once | INTRAVENOUS | Status: AC

## 2017-09-27 MED ORDER — PROCHLORPERAZINE MALEATE 10 MG PO TABS
ORAL_TABLET | ORAL | Status: AC
  Filled 2017-09-27: qty 1

## 2017-09-27 MED ORDER — PROCHLORPERAZINE MALEATE 10 MG PO TABS
10.0000 mg | ORAL_TABLET | Freq: Once | ORAL | Status: AC
  Administered 2017-09-27: 10 mg via ORAL

## 2017-09-27 MED ORDER — HEPARIN SOD (PORK) LOCK FLUSH 100 UNIT/ML IV SOLN
500.0000 [IU] | Freq: Once | INTRAVENOUS | Status: AC | PRN
  Administered 2017-09-27: 500 [IU]
  Filled 2017-09-27: qty 5

## 2017-09-27 MED ORDER — SODIUM CHLORIDE 0.9% FLUSH
10.0000 mL | INTRAVENOUS | Status: DC | PRN
  Administered 2017-09-27: 10 mL
  Filled 2017-09-27: qty 10

## 2017-09-27 MED ORDER — DIPHENHYDRAMINE HCL 25 MG PO CAPS
25.0000 mg | ORAL_CAPSULE | Freq: Once | ORAL | Status: AC
Start: 2017-09-27 — End: 2017-09-27
  Administered 2017-09-27: 25 mg via ORAL

## 2017-09-27 MED ORDER — SODIUM CHLORIDE 0.9 % IV SOLN
420.0000 mg | Freq: Once | INTRAVENOUS | Status: AC
  Administered 2017-09-27: 420 mg via INTRAVENOUS
  Filled 2017-09-27: qty 14

## 2017-09-27 NOTE — Patient Instructions (Signed)
[facility] Discharge Instructions for Patients Receiving Chemotherapy  Today you received the following chemotherapy agents Herceptin, Perjeta, Abraxane.  To help prevent nausea and vomiting after your treatment, we encourage you to take your nausea medication as prescribed.  If you develop nausea and vomiting that is not controlled by your nausea medication, call the clinic.   BELOW ARE SYMPTOMS THAT SHOULD BE REPORTED IMMEDIATELY:  *FEVER GREATER THAN 100.5 F  *CHILLS WITH OR WITHOUT FEVER  NAUSEA AND VOMITING THAT IS NOT CONTROLLED WITH YOUR NAUSEA MEDICATION  *UNUSUAL SHORTNESS OF BREATH  *UNUSUAL BRUISING OR BLEEDING  TENDERNESS IN MOUTH AND THROAT WITH OR WITHOUT PRESENCE OF ULCERS  *URINARY PROBLEMS  *BOWEL PROBLEMS  UNUSUAL RASH Items with * indicate a potential emergency and should be followed up as soon as possible.  Feel free to call the clinic should you have any questions or concerns. The clinic phone number is [phone].  Please show the [facility] at check-in to the Emergency Department and triage nurse.

## 2017-10-03 ENCOUNTER — Other Ambulatory Visit: Payer: Self-pay | Admitting: Oncology

## 2017-10-04 ENCOUNTER — Telehealth: Payer: Self-pay | Admitting: Oncology

## 2017-10-04 NOTE — Telephone Encounter (Signed)
10/04/2017 @ 9:10 am successfully faxed most recent progress notes and labs to [facility] with [facility] @ [phone]

## 2017-10-06 ENCOUNTER — Other Ambulatory Visit: Payer: Self-pay | Admitting: *Deleted

## 2017-10-06 MED ORDER — METHADONE HCL 10 MG PO TABS
20.0000 mg | ORAL_TABLET | Freq: Two times a day (BID) | ORAL | 0 refills | Status: DC
Start: 2017-10-06 — End: 2017-11-02

## 2017-10-16 ENCOUNTER — Other Ambulatory Visit: Payer: Self-pay | Admitting: Oncology

## 2017-10-17 ENCOUNTER — Ambulatory Visit: Payer: Commercial Managed Care - PPO | Admitting: Adult Health

## 2017-10-17 ENCOUNTER — Telehealth: Payer: Self-pay | Admitting: Oncology

## 2017-10-17 ENCOUNTER — Telehealth: Payer: Self-pay

## 2017-10-17 ENCOUNTER — Other Ambulatory Visit: Payer: Commercial Managed Care - PPO

## 2017-10-17 ENCOUNTER — Ambulatory Visit: Payer: Commercial Managed Care - PPO

## 2017-10-17 NOTE — Telephone Encounter (Signed)
Faxed records to [facility] [place]

## 2017-10-17 NOTE — Telephone Encounter (Signed)
Spoke with patient to r/s her 12/10-11 missed appts and she does not wish to keep them as she is getting a 2nd op at [facility].  She states to keep January appts as scheduled  [name]

## 2017-10-18 ENCOUNTER — Other Ambulatory Visit: Payer: Commercial Managed Care - PPO

## 2017-10-18 ENCOUNTER — Ambulatory Visit: Payer: Commercial Managed Care - PPO

## 2017-10-18 ENCOUNTER — Other Ambulatory Visit: Payer: Self-pay | Admitting: *Deleted

## 2017-10-24 ENCOUNTER — Telehealth: Payer: Self-pay | Admitting: Oncology

## 2017-10-24 NOTE — Telephone Encounter (Signed)
Return call; "I'm out of town.  Someone called me about an appointment.  I can't come in today I won't return until tomorrow.  I missed chemotherapy 10-17-2017 due to weather."  Message left for scheduling to call patient.

## 2017-10-24 NOTE — Telephone Encounter (Signed)
Scheduled appt per 12/17 sch msg - left voicemail for patient regarding appt.

## 2017-10-25 ENCOUNTER — Telehealth: Payer: Self-pay | Admitting: Medical Oncology

## 2017-10-25 DIAGNOSIS — Z79891 Long term (current) use of opiate analgesic: Secondary | ICD-10-CM | POA: Diagnosis not present

## 2017-10-25 NOTE — Telephone Encounter (Signed)
She is back from [facility] [place] . Dr Pablo Ledger did scans and agreed with what [name] is doing. requests refill.

## 2017-10-26 NOTE — Telephone Encounter (Signed)
This RN contacted [name] to inform her refill for methadone is not due until 12/30 - with last documented prescription dated 11/30.  Note [name] had difficulty comprehending dates when this RN stated prescription could be obtained next week on the 27th with statements like " so next Tuesday is the 61 th " - with this nurse having to assist patient in understanding today's date and when she could pick up prescription.  By end of conversation [name] was able to verbalize understanding " so I can pick up my meds next Thursday ".  [name] also informed this RN of her outcome from the [facility] - stating current therapy " is the best and [name] knows what he is doing ".

## 2017-10-28 ENCOUNTER — Other Ambulatory Visit: Payer: Self-pay | Admitting: Oncology

## 2017-10-30 ENCOUNTER — Encounter (HOSPITAL_COMMUNITY): Payer: Self-pay | Admitting: Emergency Medicine

## 2017-10-30 ENCOUNTER — Telehealth: Payer: Self-pay | Admitting: *Deleted

## 2017-10-30 ENCOUNTER — Other Ambulatory Visit: Payer: Self-pay

## 2017-10-30 ENCOUNTER — Ambulatory Visit (HOSPITAL_COMMUNITY)
Admission: EM | Admit: 2017-10-30 | Discharge: 2017-10-30 | Disposition: A | Discharge status: Home / Self Care | Payer: Commercial Managed Care - PPO | Attending: Internal Medicine | Admitting: Internal Medicine

## 2017-10-30 ENCOUNTER — Encounter: Payer: Self-pay | Admitting: *Deleted

## 2017-10-30 DIAGNOSIS — S61211A Laceration without foreign body of left index finger without damage to nail, initial encounter: Secondary | ICD-10-CM | POA: Diagnosis not present

## 2017-10-30 DIAGNOSIS — W260XXA Contact with knife, initial encounter: Secondary | ICD-10-CM

## 2017-10-30 DIAGNOSIS — Z23 Encounter for immunization: Secondary | ICD-10-CM

## 2017-10-30 MED ORDER — TETANUS-DIPHTH-ACELL PERTUSSIS 5-2.5-18.5 LF-MCG/0.5 IM SUSP
0.5000 mL | Freq: Once | INTRAMUSCULAR | Status: AC
  Administered 2017-10-30: 0.5 mL via INTRAMUSCULAR

## 2017-10-30 MED ORDER — TETANUS-DIPHTH-ACELL PERTUSSIS 5-2.5-18.5 LF-MCG/0.5 IM SUSP
INTRAMUSCULAR | Status: AC
  Filled 2017-10-30: qty 0.5

## 2017-10-30 NOTE — Discharge Instructions (Signed)
Wash cut every day with soap and water. Keep covered with bandage, avoid touching to prevent infection. Return if develop increased pain, redness, swelling or drainage from the site.

## 2017-10-30 NOTE — Telephone Encounter (Signed)
This RN spoke with pt per her VM and contact with [facility] stating she has cut her finger last night - she has wrapped it and it is not bleeding.   Robyn is asking " I need to be seen today ".  This RN informed pt MD not in the office nor do we have [facility] available due to holiday.  Finger she cut with a knife is on her non-affected hand.  This RN recommended for pt to proceed to an URGENT care ASAP due to the holiday per above due to nature of injury not related to her cancer.  Amiliah verbalized understanding of above " I will go ahead and get ready to go ".

## 2017-10-30 NOTE — Progress Notes (Signed)
On 10-30-17 fax medical records to [facility] , it was consult note, sim and planning note, end of tx note, follow up note. And dosimetry will do there part

## 2017-10-30 NOTE — ED Provider Notes (Signed)
[place]    CSN: [id number] Arrival date & time: 10/30/17  1142     History   Chief Complaint Chief Complaint  Patient presents with  . Laceration    HPI [name] is a 61 y.o. female.   [name] presents with complaints of left hand pointer finger laceration she obtained last night while cooking, she was cutting with a new knife and accidentally cut her finger. She did clean the wound and applied hydrogen peroxide. She states she has not had  Tetanus booster since childhood. Still some oozong of blood. Without pain, full ROM to finger.    ROS per HPI.       Past Medical History:  Diagnosis Date  . Breast cancer ([place])   . DVT (deep venous thrombosis) ([place]) 10/07/2011  . Hypertension   . Radiation 11/29/2005-12/29/2005   right supraclavicular area 4147 cGy  . Radiation 06/26/02-08/14/02   right breast 5040 cGy, tumor bed boosted to 1260 cGy  . Rheumatic fever     Patient Active Problem List   Diagnosis Date Noted  . Goals of care, counseling/discussion 09/05/2017  . Pneumonia 01/25/2017  . CAP (community acquired pneumonia) 01/23/2017  . Port catheter in place 05/30/2016  . 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 Chronic pain 04/13/2015  . Left arm pain 08/18/2014  . Malignant neoplasm of upper-inner quadrant of right breast in female, estrogen receptor positive ([place]) 10/03/2013  . Post-lymphadenectomy lymphedema of arm 08/15/2013  . Port or reservoir infection 01/29/2013  . DVT (deep venous thrombosis) ([place]) 10/07/2011  . Chest pain 09/10/2009  . Secondary cardiomyopathy ([place]) 09/02/2009  . Essential hypertension 02/26/2009  . GERD 02/26/2009    Past Surgical History:  Procedure Laterality Date  . BREAST SURGERY    . MASTECTOMY     bilateral  . PORT A CATH REVISION      OB History    No data available       Home Medications    Prior to Admission medications   Medication Sig Start Date End Date  Taking? Authorizing Provider  gabapentin (NEURONTIN) 300 MG capsule Take 1 capsule (300 mg total) by mouth daily. 09/06/17  Yes [name], [name], MD  methadone (DOLOPHINE) 10 MG tablet Take 2 tablets (20 mg total) by mouth every 12 (twelve) hours. Note new dosing per pt's regimen for best control 10/06/17  Yes [name], [name], MD  potassium chloride (MICRO-K) 10 MEQ CR capsule Take 2 capsules (20 mEq total) by mouth daily. 08/15/17  Yes [name], [name], MD  triamterene-hydrochlorothiazide (DYAZIDE) 37.5-25 MG capsule Take 1 each (1 capsule total) by mouth every morning. 10/30/17  Yes [name], [name], MD  alprazolam Duanne Moron) 2 MG tablet Take 2 mg by mouth daily.  03/15/13   [provider]  Ascorbic Acid (VITAMIN C) 100 MG tablet Take 100 mg by mouth daily.      [provider]  calcium-vitamin D (OSCAL WITH D) 500-200 MG-UNIT per tablet Take 1 tablet by mouth daily.      [provider]  cephALEXin (KEFLEX) 500 MG capsule Take 1 capsule (500 mg total) by mouth 3 (three) times daily. 08/12/17   Jaynee Eagles, PA-C  Multiple Vitamin (MULTIVITAMIN) capsule Take 1 capsule by mouth daily.      [provider]  mupirocin nasal ointment (BACTROBAN NASAL) 2 % Place 1 application into the nose 2 (two) times daily. Use one-half of tube in each nostril twice daily for five (  5) days. 08/21/17   Tanner, Lyndon Code., PA-C  oxyCODONE-acetaminophen (PERCOCET) 10-325 MG tablet Take 2 tablets by mouth every 4 (four) hours as needed. 02/06/17   [provider]  oxymorphone (OPANA) 10 MG tablet Take 1 tablet by mouth 2 (two) times daily. 02/06/17   [provider]  prochlorperazine (COMPAZINE) 10 MG tablet Take 1 tablet by mouth every 6 (six) hours as needed. 01/17/17   [provider]  sertraline (ZOLOFT) 50 MG tablet Take 1 tablet (50 mg total) by mouth daily. 08/16/17   [name], [name], MD    Family History Family History  Problem Relation Age of Onset    . Pancreatic cancer Mother   . Kidney cancer Sister 15  . Breast cancer Sister 25  . Breast cancer Other 12    Social History Social History   Tobacco Use  . Smoking status: Light Tobacco Smoker  . Smokeless tobacco: Never Used  Substance Use Topics  . Alcohol use: Yes    Comment: Rarely  . Drug use: No     Allergies   Pork-derived products; Tramadol; and Hydrocodone-acetaminophen   Review of Systems Review of Systems   Physical Exam Triage Vital Signs ED Triage Vitals  Enc Vitals Group     BP 10/30/17 1242 (!) 98/59     Pulse Rate 10/30/17 1242 60     Resp 10/30/17 1242 18     Temp 10/30/17 1242 98.6 F (37 C)     Temp Source 10/30/17 1242 Oral     SpO2 10/30/17 1242 100 %     Weight --      Height --      Head Circumference --      Peak Flow --      Pain Score 10/30/17 1239 5     Pain Loc --      Pain Edu? --      Excl. in [place]? --    No data found.  Updated Vital Signs BP (!) 98/59 (BP Location: Left Arm) Comment (BP Location): large cuff  Pulse 60   Temp 98.6 F (37 C) (Oral)   Resp 18   SpO2 100%   Visual Acuity Right Eye Distance:   Left Eye Distance:   Bilateral Distance:    Right Eye Near:   Left Eye Near:    Bilateral Near:     Physical Exam  Constitutional: She is oriented to person, place, and time. She appears well-developed and well-nourished. No distress.  Cardiovascular: Normal rate, regular rhythm and normal heart sounds.  Pulmonary/Chest: Effort normal and breath sounds normal.  Neurological: She is alert and oriented to person, place, and time.  Skin: Skin is warm and dry.  Left hand pointer finger with approximately 65mm laceration to pad of finger, bleeding well controlled with bandage; without underlying bone tendon injury noted; non tender; full ROM to DIP and PIP, sensation intact and cap refill <2 sec  See photo       UC Treatments / Results  Labs (all labs ordered are listed, but only abnormal results are  displayed) Labs Reviewed - No data to display  EKG  EKG Interpretation None       Radiology No results found.  Procedures Procedures (including critical care time)  Medications Ordered in UC Medications  Tdap (BOOSTRIX) injection 0.5 mL (not administered)     Initial Impression / Assessment and Plan / UC Course  I have reviewed the triage vital signs and the nursing notes.  Pertinent labs & imaging results that were available during my care of the patient were reviewed by me and considered in my medical decision making (see chart for details).     Laceration again rinsed and irrigated with soap and water, tdap updated. Closure deferred as cut occurred last night. Naturally with well approximated edges which improves with bandage placement. Standard wound care discussed and bandage replaced today. Patient verbalized understanding and agreeable to plan.    Final Clinical Impressions(s) / UC Diagnoses   Final diagnoses:  Laceration of left index finger without foreign body without damage to nail, initial encounter    ED Discharge Orders    None       Controlled Substance Prescriptions [place] Controlled Substance Registry consulted? Not Applicable   Zigmund Gottron, NP 10/30/17 1324

## 2017-10-30 NOTE — ED Triage Notes (Signed)
Laceration to left index finger.  Cut with a kitchen knife  Injury occurred last night

## 2017-11-02 ENCOUNTER — Other Ambulatory Visit: Payer: Self-pay | Admitting: *Deleted

## 2017-11-02 MED ORDER — METHADONE HCL 10 MG PO TABS: 20.0000 mg | ORAL_TABLET | Freq: Two times a day (BID) | ORAL | 0 refills | Status: DC

## 2017-11-09 ENCOUNTER — Encounter: Payer: Self-pay | Admitting: General Practice

## 2017-11-09 ENCOUNTER — Ambulatory Visit: Payer: Commercial Managed Care - PPO

## 2017-11-09 ENCOUNTER — Telehealth: Payer: Self-pay | Admitting: *Deleted

## 2017-11-09 ENCOUNTER — Other Ambulatory Visit (HOSPITAL_BASED_OUTPATIENT_CLINIC_OR_DEPARTMENT_OTHER): Payer: Commercial Managed Care - PPO

## 2017-11-09 ENCOUNTER — Ambulatory Visit (HOSPITAL_BASED_OUTPATIENT_CLINIC_OR_DEPARTMENT_OTHER): Payer: Commercial Managed Care - PPO

## 2017-11-09 VITALS — BP 113/69 | HR 81 | Temp 98.3°F | Resp 18

## 2017-11-09 DIAGNOSIS — Z5112 Encounter for antineoplastic immunotherapy: Secondary | ICD-10-CM

## 2017-11-09 DIAGNOSIS — C50211 Malignant neoplasm of upper-inner quadrant of right female breast: Secondary | ICD-10-CM | POA: Diagnosis not present

## 2017-11-09 DIAGNOSIS — C4911 Malignant neoplasm of connective and soft tissue of right upper limb, including shoulder: Secondary | ICD-10-CM

## 2017-11-09 DIAGNOSIS — Z170 Estrogen receptor positive status [ER+]: Secondary | ICD-10-CM

## 2017-11-09 DIAGNOSIS — C778 Secondary and unspecified malignant neoplasm of lymph nodes of multiple regions: Secondary | ICD-10-CM

## 2017-11-09 DIAGNOSIS — Z5111 Encounter for antineoplastic chemotherapy: Secondary | ICD-10-CM | POA: Diagnosis not present

## 2017-11-09 DIAGNOSIS — Z95828 Presence of other vascular implants and grafts: Secondary | ICD-10-CM

## 2017-11-09 LAB — COMPREHENSIVE METABOLIC PANEL
ALT: 19 U/L (ref 0–55)
AST: 30 U/L (ref 5–34)
Albumin: 3.5 g/dL (ref 3.5–5.0)
Alkaline Phosphatase: 120 U/L (ref 40–150)
Anion Gap: 9 mEq/L (ref 3–11)
BUN: 19.1 mg/dL (ref 7.0–26.0)
CO2: 32 mEq/L — ABNORMAL HIGH (ref 22–29)
Calcium: 9.1 mg/dL (ref 8.4–10.4)
Chloride: 101 mEq/L (ref 98–109)
Creatinine: 1 mg/dL (ref 0.6–1.1)
EGFR: >60 mL/min/{1.73_m2} (ref >60)
Glucose: 110 mg/dl (ref 70–140)
Potassium: 3.9 mEq/L (ref 3.5–5.1)
Sodium: 142 mEq/L (ref 136–145)
Total Bilirubin: <0.22 mg/dL (ref 0.20–1.20)
Total Protein: 7.6 g/dL (ref 6.4–8.3)

## 2017-11-09 LAB — CBC WITH DIFFERENTIAL/PLATELET
BASO%: 1 % (ref 0.0–2.0)
Basophils Absolute: 0.1 10*3/uL (ref 0.0–0.1)
EOS%: 5.8 % (ref 0.0–7.0)
Eosinophils Absolute: 0.3 10*3/uL (ref 0.0–0.5)
HCT: 35.3 % (ref 34.8–46.6)
HGB: 11.3 g/dL — ABNORMAL LOW (ref 11.6–15.9)
LYMPH%: 27 % (ref 14.0–49.7)
MCH: 26.7 pg (ref 25.1–34.0)
MCHC: 31.9 g/dL (ref 31.5–36.0)
MCV: 83.9 fL (ref 79.5–101.0)
MONO#: 0.7 10*3/uL (ref 0.1–0.9)
MONO%: 12.7 % (ref 0.0–14.0)
NEUT#: 2.9 10*3/uL (ref 1.5–6.5)
NEUT%: 53.5 % (ref 38.4–76.8)
Platelets: 231 10*3/uL (ref 145–400)
RBC: 4.21 10*6/uL (ref 3.70–5.45)
RDW: 16.9 % — ABNORMAL HIGH (ref 11.2–14.5)
WBC: 5.4 10*3/uL (ref 3.9–10.3)
lymph#: 1.5 10*3/uL (ref 0.9–3.3)

## 2017-11-09 MED ORDER — SODIUM CHLORIDE 0.9 % IV SOLN
Freq: Once | INTRAVENOUS | Status: AC
  Administered 2017-11-09: 14:00:00 via INTRAVENOUS

## 2017-11-09 MED ORDER — TRASTUZUMAB CHEMO 150 MG IV SOLR
6.0000 mg/kg | Freq: Once | INTRAVENOUS | Status: DC
  Filled 2017-11-09: qty 25

## 2017-11-09 MED ORDER — SODIUM CHLORIDE 0.9% FLUSH
10.0000 mL | INTRAVENOUS | Status: DC | PRN
  Administered 2017-11-09: 10 mL
  Filled 2017-11-09: qty 10

## 2017-11-09 MED ORDER — PROCHLORPERAZINE MALEATE 10 MG PO TABS
ORAL_TABLET | ORAL | Status: AC
  Filled 2017-11-09: qty 1

## 2017-11-09 MED ORDER — SODIUM CHLORIDE 0.9 % IV SOLN
420.0000 mg | Freq: Once | INTRAVENOUS | Status: AC
  Administered 2017-11-09: 420 mg via INTRAVENOUS
  Filled 2017-11-09: qty 14

## 2017-11-09 MED ORDER — ACETAMINOPHEN 325 MG PO TABS
650.0000 mg | ORAL_TABLET | Freq: Once | ORAL | Status: AC
  Administered 2017-11-09: 650 mg via ORAL

## 2017-11-09 MED ORDER — SODIUM CHLORIDE 0.9 % IJ SOLN
10.0000 mL | INTRAMUSCULAR | Status: DC | PRN
  Administered 2017-11-09: 10 mL via INTRAVENOUS
  Filled 2017-11-09: qty 10

## 2017-11-09 MED ORDER — PACLITAXEL PROTEIN-BOUND CHEMO INJECTION 100 MG
100.0000 mg/m2 | Freq: Once | INTRAVENOUS | Status: AC
  Administered 2017-11-09: 200 mg via INTRAVENOUS
  Filled 2017-11-09: qty 40

## 2017-11-09 MED ORDER — TRASTUZUMAB CHEMO 150 MG IV SOLR
6.0000 mg/kg | Freq: Once | INTRAVENOUS | Status: AC
  Administered 2017-11-09: 525 mg via INTRAVENOUS
  Filled 2017-11-09: qty 25

## 2017-11-09 MED ORDER — PROCHLORPERAZINE MALEATE 10 MG PO TABS
10.0000 mg | ORAL_TABLET | Freq: Once | ORAL | Status: AC
  Administered 2017-11-09: 10 mg via ORAL

## 2017-11-09 MED ORDER — DIPHENHYDRAMINE HCL 25 MG PO CAPS
ORAL_CAPSULE | ORAL | Status: AC
  Filled 2017-11-09: qty 1

## 2017-11-09 MED ORDER — SODIUM CHLORIDE 0.9 % IV SOLN: 440.0000 mg | Freq: Once | INTRAVENOUS | Status: DC

## 2017-11-09 MED ORDER — DIPHENHYDRAMINE HCL 25 MG PO CAPS
25.0000 mg | ORAL_CAPSULE | Freq: Once | ORAL | Status: AC
  Administered 2017-11-09: 25 mg via ORAL

## 2017-11-09 MED ORDER — ACETAMINOPHEN 325 MG PO TABS
ORAL_TABLET | ORAL | Status: AC
  Filled 2017-11-09: qty 2

## 2017-11-09 MED ORDER — HEPARIN SOD (PORK) LOCK FLUSH 100 UNIT/ML IV SOLN
500.0000 [IU] | Freq: Once | INTRAVENOUS | Status: AC | PRN
  Administered 2017-11-09: 500 [IU]
  Filled 2017-11-09: qty 5

## 2017-11-09 NOTE — Patient Instructions (Signed)
[facility] Discharge Instructions for Patients Receiving Chemotherapy  Today you received the following chemotherapy agents: Herceptin, Perjeta, Abraxane  To help prevent nausea and vomiting after your treatment, we encourage you to take your nausea medication as prescribed.  If you develop nausea and vomiting that is not controlled by your nausea medication, call the clinic.   BELOW ARE SYMPTOMS THAT SHOULD BE REPORTED IMMEDIATELY:  *FEVER GREATER THAN 100.5 F  *CHILLS WITH OR WITHOUT FEVER  NAUSEA AND VOMITING THAT IS NOT CONTROLLED WITH YOUR NAUSEA MEDICATION  *UNUSUAL SHORTNESS OF BREATH  *UNUSUAL BRUISING OR BLEEDING  TENDERNESS IN MOUTH AND THROAT WITH OR WITHOUT PRESENCE OF ULCERS  *URINARY PROBLEMS  *BOWEL PROBLEMS  UNUSUAL RASH Items with * indicate a potential emergency and should be followed up as soon as possible.  Feel free to call the clinic should you have any questions or concerns. The clinic phone number is [phone].  Please show the [facility] at check-in to the Emergency Department and triage nurse.

## 2017-11-09 NOTE — Progress Notes (Signed)
Ok to treat per [place] [employer] for MD [name] with echo from 07/20/17

## 2017-11-09 NOTE — Telephone Encounter (Signed)
Keiera states she had family and friends over for the holidays and now she has missing methadone.  " I think one of the friends took some my medicine- so what can I do "  This RN informed Verlisa that legally once a prescription is filled it it the patient's responsibility - refill cannot be given due to what may have occurred while in the pt's possession.  This RN discussed with [name] she needs to keep her medications safe- especially her narcotics in a lock box- which Donasia states she does " but my key was hanging up on the wall so they must have got it "  This RN informed pt she regrets that someone could be so cruel as to take her medications - but again Korrina needs to be prepared that when people come in her home her lock box and key are not readily available. Abri states she keeps her lock box on her dresser and her key hanging up on the wall.  This RN informed pt legally this office cannot refill a prescription for narcotics early due to the above. She will need to use her pain medication ( note she has percocet and oxymorphone as well ) wisely and use tylenol and ibuprofen if needed.  Pt is scheduled for follow up with MD with next chemo and refill can be further addressed at that time.

## 2017-11-09 NOTE — Progress Notes (Signed)
[facility] CSW Progress Note  Patient stopped by [facility] office w grandson - wanted information on status of Cancer Care grant - CSW reviewed chart, no record that patient's application has been submitted.  Per Cancer Care, application was emailed on 11/30 to [facility].  No record that it was submitted to Cancer Care.  CSW requested another copy and provided to patient.  Patient will need to complete and bring proof of income.  CSW spoke w Estate manager/land agent - patient has received maximum assistance available from other [facility] resources and is not eligible for further help at this time.  When patient brings proof of income, CSW will submit [place] on her behalf.  Edwyna Shell, LCSW Clinical Social Worker Phone:  [phone]

## 2017-11-15 ENCOUNTER — Other Ambulatory Visit: Payer: Self-pay | Admitting: Oncology

## 2017-11-15 NOTE — Progress Notes (Signed)
[name] was reevaluated at [facility] [place] on 10/19/2017.  They obtain lab work including a ferritin of 64, and vitamin D 25-hydroxy of 31, a CA 15-3 of 63.7 and they obtained CTs of the chest and abdomen on 10/20/2017.  There was a 1.3 cm left axillary lymph node of uncertain significance.  There was a stable 0.3 cm right lower lobe nodule.  There were no new suspicious lung nodules.  Some right upper lobe radiation change was noted.  There were no effusions or infiltrates.  The CT of the abdomen showed normal liver and spleen.  There were small lucencies in the left lobe of the liver consistent with cysts.  There were no lytic or blastic lesions in the bones.  The soft tissue infiltration around the right shoulder was not seen this time, there was subcutaneous soft tissue nodularity in the middle around the T8 level measuring 2.1 cm maximally, additional nodules more inferiorly measured 3.7 cm, and another subcutaneous mass was seen more posteriorly measuring 2 cm anteriorly.

## 2017-11-15 NOTE — Progress Notes (Signed)
[name] was reevaluated at [facility] [place] on 10/19/2017.  They obtain lab work including a ferritin of 64, and vitamin D 25-hydroxy of 31, a CA 15-3 of 63.7, a CA-27-29 of 75, and they obtained CTs of the chest and abdomen on 10/20/2017.  There was a 1.3 cm left axillary lymph node of uncertain significance.  There was a stable 0.3 cm right lower lobe nodule.  There were no new suspicious lung nodules.  Some right upper lobe radiation change was noted.  There were no effusions or infiltrates.  The CT of the abdomen showed normal liver and spleen.  There were small lucencies in the left lobe of the liver consistent with cysts.  There were no lytic or blastic lesions in the bones.  The soft tissue infiltration around the right shoulder was not seen this time, there was subcutaneous soft tissue nodularity in the middle around the T8 level measuring 2.1 cm maximally, additional nodules more inferiorly measured 3.7 cm, and another subcutaneous mass was seen more posteriorly measuring 2 cm anteriorly.  She also had a negative bone scan on the same date.  I do not find a summary or recommendation but according to [place] they said to "keep on doing what you are doing".

## 2017-11-16 ENCOUNTER — Telehealth: Payer: Self-pay | Admitting: *Deleted

## 2017-11-16 NOTE — Telephone Encounter (Signed)
This RN spoke with pt per her call - note Eunie had difficulty with discussion and memory during conversation ( she twice had to ask " who is this ").  Sayaka was stating concern about " ever since I had that bone scan I ain't felt right at times "-  " I thought I was going to be seen at my last visit " " what chemo did I get last time "  This RN answered pt's questions - including that MD reviewed her scans from [place]- which shows overall stable disease with outcome of no changes in chemo plan.  Informed her she would be seen at next visit ( she was just seen at [facility] and did not need a visit with last infusion )  Reviewed with [name] drugs she received at last treatment.  Aalaiyah did ask about her pain medication " I think I need a refill before my appointment "  This RN reviewed with her last refill was given on 12/27 and refill cannot be given early.  Per end of discussion Weda verbalized understanding as well as apology for being forgetful stating she has increased stressors occurring.  No other needs at this time.

## 2017-11-23 ENCOUNTER — Telehealth: Payer: Self-pay | Admitting: *Deleted

## 2017-11-23 NOTE — Telephone Encounter (Signed)
Pt came in to the waiting room today requesting to see this RN  " you told me I could get my refill on the 17th ".  This RN showed pt printed receipt that her prior refill for methadone was on 11/02/2017 - refill cannot be given today due to pt obtained on 11/02/2017. This RN reiterated to pt refill can be obtained at MD visit on 1/23.  See prior notes regarding issues with medication.  Pt politely stated understanding - no further needs at this time.

## 2017-11-27 ENCOUNTER — Other Ambulatory Visit: Payer: Self-pay | Admitting: Oncology

## 2017-11-27 NOTE — Progress Notes (Signed)
[facility]  [phone] [phone]  OFFICE PROGRESS NOTE   ID: [name]   DOB: 27-Dec-1955  MR#: [id number]  ACZ#:[id number]   PCP: [name], MD [name], [name], [name] RAD FUX:[id number] [name], [name] 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  She sought attention and had a mammogram which showed an obvious carcinoma in the upper inner quadrant of the right breast, approximately 2 cm.  There were some enlarged lymph nodes in the axilla and an FNA done showed those consistent with malignant cells, most likely an invasive ductal carcinoma.  At that point she was unsure of what to do and was referred by Dr. [name] for a discussion of her treatment options.  By biopsy, it was ER/PR negative and HER-2 was 3+.  DNA index was 1.42.  We reiterated that it was most important to have her disease surgically addressed at that point and had recommended lumpectomy and axillary nodes to be addressed with which she followed through and on 04-03-02, Dr. [name] performed a right partial mastectomy and a right axillary lymph node dissection.  Final pathology revealed a 2.4 cm. high grade, Grade III invasive ductal carcinoma with an adjacent .8 cm. also high grade invasive ductal carcinoma which was felt to represent an intramammary metastasis rather than a second primary.  A smaller mass was just medial to the larger mass.  The smaller mass was associated with high grade DCIS component.  There was no definite lymphovascular invasion identified.  However, one of fourteen axillary lymph nodes did  contain a 1.5 cm. metastatic deposit.  Postoperatively she did very well.  We reiterated the fact that she did need adjuvant chemotherapy, however, she refused and decided that she would pursue radiation.  She received radiation and completed that on 08-14-02. Her subsequent history is as detailed below.   INTERVAL HISTORY: [name] returns today for follow-up of her stage IV HER-2 positive breast cancer. She receives paclitaxel every 21 days with today being day 1 cycle 4 on the current series. She tolerates this well.  She also receives trastuzumab, and pertuzumab every 21 days with today being day 1 cycle 16. She also tolerates this well.  Since the last visit here [name] went back to [facility] in [place] on 10/19/2017.  They obtained lab work including a ferritin of 64, and vitamin D 25-hydroxy of 31,  CA 15-3 of 63.7,  CA-27-29 of 75, and they obtained CTs of the chest and abdomen on 10/20/2017.  There was a 1.3 cm left axillary lymph node of uncertain significance.  There was a stable 0.3 cm right lower lobe nodule.  There were no new suspicious lung nodules.  Some right upper lobe radiation change was noted.  There were no effusions or infiltrates.  The CT of the abdomen showed normal liver and spleen.  There were small lucencies in the left lobe of the liver consistent with cysts.  There were no lytic or blastic lesions in the bones.  The soft tissue infiltration around the right shoulder was not seen this time. There was subcutaneous soft tissue nodularity in the middle around the T8  level measuring 2.1 cm maximally, additional nodules more inferiorly measured 3.7 cm, and another subcutaneous mass was seen more posteriorly measuring 2 cm anteriorly.  She also had a negative bone scan on the same date.  I do not find a summary or recommendation but according to [name] they said to "keep on doing what you are doing".  REVIEW OF SYSTEMS: [name] reports that she is doing  well overall. She notes that Dr. Pablo Ledger from the Center extends his help to [name] concerning her back tumors. She notes that she has been depressed lately. She notes that she missed a treatment when she came back from the [facility] because of a scheduling conflict. She notes that she has chronic pain, but she is able to manage with methadone. She notes that she takes one pill in the morning and one at night. She denies unusual headaches, visual changes, nausea, vomiting, or dizziness. There has been no unusual cough, phlegm production, or pleurisy. This been no change in bowel or bladder habits. She denies unexplained fatigue or unexplained weight loss, bleeding, rash, or fever. A detailed review of systems was otherwise stable.    PAST MEDICAL HISTORY: Past Medical History:  Diagnosis Date  . Breast cancer ([facility])   . DVT (deep venous thrombosis) ([facility]) 10/07/2011  . Hypertension   . Radiation 11/29/2005-12/29/2005   right supraclavicular area 4147 cGy  . Radiation 06/26/02-08/14/02   right breast 5040 cGy, tumor bed boosted to 1260 cGy  . Rheumatic fever     PAST SURGICAL HISTORY: Past Surgical History:  Procedure Laterality Date  . BREAST SURGERY    . MASTECTOMY     bilateral  . PORT A CATH REVISION      FAMILY HISTORY Family History  Problem Relation Age of Onset  . Pancreatic cancer Mother   . Kidney cancer Sister 74  . Breast cancer Sister 15  . Breast cancer Other 78  The patient's father died in his 40J  from complications of alcohol abuse. The patient's mother died in her 51s from pancreatic cancer. The patient has 6 brothers, 2 sisters. Both her sisters have had breast cancer, both diagnosed after the age of 1. The patient has not had genetic testing so far.   GYNECOLOGIC HISTORY: Menarche age 13; first live birth age 61. She is GXP4. She is not sure when she stopped having periods. She never used hormone replacement.  SOCIAL HISTORY: The  patient has worked in the past as a Psychologist, counselling. At home in addition to the patient are her husband [name], originally from [place], who works as a Administrator.  Also living in the home is her daughter [name] (she is studying to be a Marine scientist).  The other 2 children are [name], who has a college degree in psychology and works as a Probation officer; and [name], who lives in [place] and works as a Higher education careers adviser.[name] also sometimes stays in the home. In addition the patient has an aid who helps her almost daily.   ADVANCED DIRECTIVES: Not in place  HEALTH MAINTENANCE: Social History   Tobacco Use  . Smoking status: Light Tobacco Smoker  . Smokeless tobacco: Never Used  Substance Use Topics  . Alcohol use: Yes    Comment: Rarely  . Drug use: No     Colonoscopy:  Not on file  PAP:  Not on file  Bone density:  Not on file  Lipid panel:  Not on file    Allergies  Allergen Reactions  . Pork-Derived Products Other (See Comments)    Pt says she just doesn't eat pork; has no reaction to pork products  . Tramadol Nausea Only  . Hydrocodone-Acetaminophen Itching and Rash    Current Outpatient Medications  Medication Sig Dispense Refill  . alprazolam (XANAX) 2 MG tablet Take 2 mg by mouth daily.     . Ascorbic Acid (VITAMIN C) 100 MG tablet Take 100 mg by mouth daily.      . calcium-vitamin D (OSCAL WITH D) 500-200 MG-UNIT per tablet Take 1 tablet by mouth daily.      . cephALEXin (KEFLEX) 500 MG capsule Take 1 capsule (500 mg total) by mouth 3 (three) times daily. 30 capsule 0  . gabapentin (NEURONTIN) 300 MG capsule Take 1 capsule (300 mg total) by mouth daily. 90 capsule 3  . methadone (DOLOPHINE) 10 MG tablet Take 2 tablets (20 mg total) by mouth every 12 (twelve) hours. Note new dosing per pt's regimen for best control 120 tablet 0  . Multiple Vitamin (MULTIVITAMIN) capsule Take 1 capsule by mouth daily.      . mupirocin nasal ointment (BACTROBAN NASAL) 2 % Place 1 application into  the nose 2 (two) times daily. Use one-half of tube in each nostril twice daily for five (5) days. 10 g 0  . oxyCODONE-acetaminophen (PERCOCET) 10-325 MG tablet Take 2 tablets by mouth every 4 (four) hours as needed.  0  . oxymorphone (OPANA) 10 MG tablet Take 1 tablet by mouth 2 (two) times daily.  0  . potassium chloride (MICRO-K) 10 MEQ CR capsule Take 2 capsules (20 mEq total) by mouth daily. 60 capsule 3  . prochlorperazine (COMPAZINE) 10 MG tablet Take 1 tablet by mouth every 6 (six) hours as needed.    . sertraline (ZOLOFT) 50 MG tablet Take 1 tablet (50 mg total) by mouth daily. 30 tablet 3  . triamterene-hydrochlorothiazide (DYAZIDE) 37.5-25 MG capsule Take 1 each (1 capsule total) by mouth every morning. 30 capsule 0   No current facility-administered medications for this visit.    Facility-Administered Medications Ordered in Other Visits  Medication Dose Route Frequency Provider Last Rate Last Dose  . sodium chloride 0.9 % injection 10 mL  10 mL Intravenous PRN Lizbeth Feijoo, [name], MD   10 mL at 03/04/14 1421    OBJECTIVE: Middle-aged African-American woman in no acute distress  Vitals:   11/29/17 0957  BP: 127/74  Pulse: 72  Resp: 20  Temp: 98.1 F (36.7 C)  SpO2: 100%     Body mass index is 30.65 kg/m.    ECOG FS: 1 Filed Weights   11/29/17 0957  Weight: 189 lb 14.4 oz (86.1 kg)   Sclerae unicteric, pupils round and equal Oropharynx clear and moist No cervical or supraclavicular adenopathy Lungs no rales or rhonchi Heart regular rate and rhythm Abd soft, nontender, positive bowel sounds MSK no focal spinal tenderness, no upper extremity lymphedema Neuro: nonfocal, well oriented, appropriate affect Breasts: Status post bilateral mastectomies, no evidence of chest wall recurrence Skin: The back lesions appear slightly smaller and drier.  These were imaged below.  Photo 01 /23 /2019     Photo 07/04/2017     Photo 04/06/2017    LAB RESULTS:.  Lab  Results  Component Value Date   WBC 6.1 11/29/2017   NEUTROABS 2.9 11/29/2017   HGB 11.7 11/29/2017   HCT 37.6 11/29/2017   MCV 86.4 11/29/2017   PLT 257 11/29/2017  Chemistry      Component Value Date/Time   NA 142 11/09/2017 1214   K 3.9 11/09/2017 1214   CL 105 01/24/2017 0657   CL 101 03/07/2013 1411   CO2 32 (H) 11/09/2017 1214   BUN 19.1 11/09/2017 1214   CREATININE 1.0 11/09/2017 1214      Component Value Date/Time   CALCIUM 9.1 11/09/2017 1214   ALKPHOS 120 11/09/2017 1214   AST 30 11/09/2017 1214   ALT 19 11/09/2017 1214   BILITOT <0.22 11/09/2017 1214       STUDIES:   ASSESSMENT:  62 y.o. [place] woman with stage IV breast cancer manifested chiefly by loco-regional nodal disease (neck, chest) and skin involvement, without liver, bone, or brain metastases documented   (1) Status post right upper inner quadrant lumpectomy and sentinel lymph node sampling 03/04/2002 for 2 separate foci of invasive ductal carcinoma, mpT2 pN1 or stage IIB, both foci grade 3, both estrogen and progesterone receptor positive, both HercepTest 3+, Mib-1 56%  (2) Reexcision for margins 05/27/2002 showed no residual cancer in the breast.  (3) The patient refused adjuvant systemic therapy.  (4) Adjuvant radiation treatment completed 08/14/2002.  (5) recurrence in the right breast in 02/2004 showing a morphologically different tumor, again grade 3, again estrogen and progesterone receptor negative, with an MIB-1 of 14% and Herceptest 3+.  (5) Between 03/2004 and 07/2004 she received dose dense Doxorubicin/Cyclophosphamide x 4 given with trastuzumab, followed by weekly Docetaxel x 8, again given with trastuzumab.  (6) Right mastectomy 07/13/2004 showed scattered microscopic foci of residual disease over an area  greater than 5 cm. Margins were negative.  (7) Postoperative Docetaxel continued until 09/2004.  (8) Trastuzumab (Herceptin) given 08/2004 through 01/2012 with some  brief interruptions.  (9) Isolated right cervical nodal recurrence 10/2005, treated with radiation to the right supraclavicular area (total 41.5 gray) completed 12/29/2005.  (10) Navelbine given together with Herceptin  11/2005 through 03/2006.  (9) Left mastectomy 02/13/2006 for ductal carcinoma in situ, grade 2, estrogen and progesterone receptor negative, with negative margins; 0 of 3 lymph nodes involved  (10) treated with Lapatinib and Capecitabine before 10/2009, for an unclear duration and with unclear results (cannot locate data on chart review).  (11) Status post right supraclavicular lymph node biopsy 09/2010 again positive for an invasive ductal carcinoma, estrogen and progesterone receptor negative, HER-2 positive by CISH with a ratio 4.25.  (12) Navelbine given together with Herceptin between 05/2011 and 11/2011.  (13) Carboplatin/ Gemcitabine/ Herceptin given for 2 cycles, in 12/2011 and 01/2012.  (14) TDM-1 ([place]) started 02/2012. Last dose 10/02/2013 after which the patient discontinued treatment at her own discretion. Echo on December 2014 showed a well preserved ejection fraction.  (15) Deep vein thrombosis of the right upper extremity documented 04/20/2011.  She completed anticoagulant therapy with Coumadin on 03/25/2013.  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 Left chest Port-A-Cath placed on 04/08/2013; being flushed every 6 weeks  RIGHT UPPER EXTREMITY ANGIOSARCOMA VS BREAST CANCER: August 2016 (18) treated at [facility] August 2016 with paclitaxel, trastuzumab and pertuzumab 1 cycle, afterwards referred to hospice  (19) under the care of hospice of [facility] August 2016 to 05/08/2016, when the patient  opted for a second try at chemotherapy  (20) started low-dose Abraxane, trastuzumab and  pertuzumab 06/07/2016, to be repeated every 3 weeks.   (a) pretreatment echocardiogram 06/06/2016 showed a 60-65% ejection fraction  (b) significant compliance problems compromise response to treatment  (c)  echocardiogram 02/20/17 LVEF 55-60%  (d) Abraxane discontinued 07/04/2017 because of neuropathy symptoms  (e) Gemzar every 21 days started 07/04/2017  (f) echocardiogram 07/20/2017 showed an ejection fraction of 60-65%  (21) Chronic pain secondary to known metastatic disease  (a) patient signed pain contract 10/19/2016  (b) as of 11/16/2016 receives her pain medications from Dr [name]  (c) we prescribe methadone to the patient but no other narcotics (currently 10 mg twice daily)   PLAN: [name] will soon be 16 years out from definitive diagnosis of breast cancer and is currently 2-1/2 years out from diagnosis of right upper extremity angios.  She is tolerating the Abraxane and trastuzumab/Pertuzumab well, and particularly denies any issues relating to peripheral neuropathy.  We are obtaining echoes every 6 months and her next echocardiogram will be in March.  She is receiving the treatments every 4 weeks.  [name]'s treatments are complicated by frequent irregularities in her showing up or in her taking medications.  She is currently taking the methadone at 10 mg twice daily.  She has been previously prescribed 10 mg 3 times a day and 20 mg twice a day.  It is unclear what she actually does with the medication and there have been several locations of "losing" the medication.  Her other narcotics are prescribed through her primary care physician.  Today I refilled her methadone at 10 mg twice daily which is what she tells me she is currently taking.  She will receive her next treatment in 4 weeks and then treatment and visit 8 weeks from now.  She knows to call for any other issues that may  develop before the next visit.   [name], [name], MD  11/29/17 10:18 AM Medical Oncology and Hematology [facility] [address] [place], [place] [address] Tel. [phone]    Fax. [phone]  This document serves as a record of services personally performed by Lurline Del, MD. It was created on his behalf by Sheron Nightingale, a trained medical scribe. The creation of this record is based on the scribe's personal observations and the provider's statements to them.   I have reviewed the above documentation for accuracy and completeness, and I agree with the above.

## 2017-11-28 ENCOUNTER — Other Ambulatory Visit: Payer: Self-pay | Admitting: *Deleted

## 2017-11-28 ENCOUNTER — Encounter: Payer: Self-pay | Admitting: General Practice

## 2017-11-28 DIAGNOSIS — Z170 Estrogen receptor positive status [ER+]: Secondary | ICD-10-CM

## 2017-11-28 DIAGNOSIS — C50211 Malignant neoplasm of upper-inner quadrant of right female breast: Secondary | ICD-10-CM

## 2017-11-28 DIAGNOSIS — I429 Cardiomyopathy, unspecified: Secondary | ICD-10-CM

## 2017-11-28 NOTE — Progress Notes (Signed)
[facility] CSW Progress Note  Call from patient wanting phone number for [facility] program - CSW had submitted application for financial help on behalf of patient.  Per patient, she has called agency, notified that she was approved for help.  Wants to know when check will arrive.  CSW gave patient contact information and encouraged her to contact Cancer Care directly.  Edwyna Shell, LCSW Clinical Social Worker Phone:  [phone]

## 2017-11-29 ENCOUNTER — Other Ambulatory Visit: Payer: Self-pay | Admitting: *Deleted

## 2017-11-29 ENCOUNTER — Telehealth: Payer: Self-pay | Admitting: Oncology

## 2017-11-29 ENCOUNTER — Inpatient Hospital Stay: Payer: Commercial Managed Care - PPO

## 2017-11-29 ENCOUNTER — Inpatient Hospital Stay: Payer: Commercial Managed Care - PPO | Attending: Oncology | Admitting: Oncology

## 2017-11-29 VITALS — BP 127/74 | HR 72 | Temp 98.1°F | Resp 20 | Ht 66.0 in | Wt 189.9 lb

## 2017-11-29 DIAGNOSIS — Z170 Estrogen receptor positive status [ER+]: Secondary | ICD-10-CM

## 2017-11-29 DIAGNOSIS — C778 Secondary and unspecified malignant neoplasm of lymph nodes of multiple regions: Secondary | ICD-10-CM | POA: Diagnosis not present

## 2017-11-29 DIAGNOSIS — F17200 Nicotine dependence, unspecified, uncomplicated: Secondary | ICD-10-CM

## 2017-11-29 DIAGNOSIS — C792 Secondary malignant neoplasm of skin: Secondary | ICD-10-CM | POA: Insufficient documentation

## 2017-11-29 DIAGNOSIS — I429 Cardiomyopathy, unspecified: Secondary | ICD-10-CM

## 2017-11-29 DIAGNOSIS — Z86718 Personal history of other venous thrombosis and embolism: Secondary | ICD-10-CM | POA: Diagnosis not present

## 2017-11-29 DIAGNOSIS — G893 Neoplasm related pain (acute) (chronic): Secondary | ICD-10-CM | POA: Insufficient documentation

## 2017-11-29 DIAGNOSIS — C4911 Malignant neoplasm of connective and soft tissue of right upper limb, including shoulder: Secondary | ICD-10-CM | POA: Insufficient documentation

## 2017-11-29 DIAGNOSIS — Z95828 Presence of other vascular implants and grafts: Secondary | ICD-10-CM

## 2017-11-29 DIAGNOSIS — I82729 Chronic embolism and thrombosis of deep veins of unspecified upper extremity: Secondary | ICD-10-CM

## 2017-11-29 DIAGNOSIS — Z923 Personal history of irradiation: Secondary | ICD-10-CM | POA: Diagnosis not present

## 2017-11-29 DIAGNOSIS — Z5111 Encounter for antineoplastic chemotherapy: Secondary | ICD-10-CM | POA: Diagnosis not present

## 2017-11-29 DIAGNOSIS — C50211 Malignant neoplasm of upper-inner quadrant of right female breast: Secondary | ICD-10-CM

## 2017-11-29 DIAGNOSIS — Z5112 Encounter for antineoplastic immunotherapy: Secondary | ICD-10-CM | POA: Insufficient documentation

## 2017-11-29 LAB — CBC WITH DIFFERENTIAL/PLATELET
Basophils Absolute: 0.1 K/uL (ref 0.0–0.1)
Basophils Relative: 1 %
Eosinophils Absolute: 0.1 K/uL (ref 0.0–0.5)
Eosinophils Relative: 2 %
HCT: 37.6 % (ref 34.8–46.6)
Hemoglobin: 11.7 g/dL (ref 11.6–15.9)
Lymphocytes Relative: 36 %
Lymphs Abs: 2.2 K/uL (ref 0.9–3.3)
MCH: 26.9 pg (ref 25.1–34.0)
MCHC: 31.1 g/dL — ABNORMAL LOW (ref 31.5–36.0)
MCV: 86.4 fL (ref 79.5–101.0)
Monocytes Absolute: 0.8 K/uL (ref 0.1–0.9)
Monocytes Relative: 12 %
Neutro Abs: 2.9 K/uL (ref 1.5–6.5)
Neutrophils Relative %: 49 %
Platelets: 257 K/uL (ref 145–400)
RBC: 4.35 MIL/uL (ref 3.70–5.45)
RDW: 16.6 % — ABNORMAL HIGH (ref 11.2–16.1)
WBC: 6.1 K/uL (ref 3.9–10.3)

## 2017-11-29 LAB — COMPREHENSIVE METABOLIC PANEL WITH GFR
ALT: 10 U/L (ref 0–55)
AST: 21 U/L (ref 5–34)
Albumin: 3.8 g/dL (ref 3.5–5.0)
Alkaline Phosphatase: 94 U/L (ref 40–150)
Anion gap: 10 (ref 3–11)
BUN: 15 mg/dL (ref 7–26)
CO2: 28 mmol/L (ref 22–29)
Calcium: 9.4 mg/dL (ref 8.4–10.4)
Chloride: 101 mmol/L (ref 98–109)
Creatinine, Ser: 1.36 mg/dL — ABNORMAL HIGH (ref 0.60–1.10)
GFR calc Af Amer: 47 mL/min — ABNORMAL LOW (ref >60)
GFR calc non Af Amer: 41 mL/min — ABNORMAL LOW (ref >60)
Glucose, Bld: 98 mg/dL (ref 70–140)
Potassium: 3.5 mmol/L (ref 3.3–4.7)
Sodium: 139 mmol/L (ref 136–145)
Total Bilirubin: 0.3 mg/dL (ref 0.2–1.2)
Total Protein: 7.5 g/dL (ref 6.4–8.3)

## 2017-11-29 MED ORDER — SODIUM CHLORIDE 0.9 % IV SOLN: Freq: Once | INTRAVENOUS | Status: AC

## 2017-11-29 MED ORDER — METHADONE HCL 10 MG PO TABS: 20.0000 mg | ORAL_TABLET | Freq: Two times a day (BID) | ORAL | 0 refills | Status: DC

## 2017-11-29 MED ORDER — DIPHENHYDRAMINE HCL 25 MG PO CAPS
25.0000 mg | ORAL_CAPSULE | Freq: Once | ORAL | Status: AC
  Administered 2017-11-29: 25 mg via ORAL

## 2017-11-29 MED ORDER — PROCHLORPERAZINE MALEATE 10 MG PO TABS
ORAL_TABLET | ORAL | Status: AC
  Filled 2017-11-29: qty 1

## 2017-11-29 MED ORDER — SODIUM CHLORIDE 0.9 % IV SOLN
420.0000 mg | Freq: Once | INTRAVENOUS | Status: AC
  Administered 2017-11-29: 420 mg via INTRAVENOUS
  Filled 2017-11-29: qty 14

## 2017-11-29 MED ORDER — METHADONE HCL 10 MG PO TABS: 10.0000 mg | ORAL_TABLET | Freq: Two times a day (BID) | ORAL | 0 refills | Status: DC

## 2017-11-29 MED ORDER — TRIAMTERENE-HCTZ 37.5-25 MG PO CAPS: 1.0000 | ORAL_CAPSULE | Freq: Every morning | ORAL | 0 refills | Status: DC

## 2017-11-29 MED ORDER — DIPHENHYDRAMINE HCL 25 MG PO CAPS
ORAL_CAPSULE | ORAL | Status: AC
Start: 2017-11-29 — End: 2017-11-29
  Filled 2017-11-29: qty 1

## 2017-11-29 MED ORDER — SODIUM CHLORIDE 0.9% FLUSH
10.0000 mL | INTRAVENOUS | Status: DC | PRN
  Filled 2017-11-29: qty 10

## 2017-11-29 MED ORDER — SODIUM CHLORIDE 0.9 % IV SOLN
6.0000 mg/kg | Freq: Once | INTRAVENOUS | Status: AC
  Administered 2017-11-29: 525 mg via INTRAVENOUS
  Filled 2017-11-29: qty 25

## 2017-11-29 MED ORDER — SODIUM CHLORIDE 0.9% FLUSH
10.0000 mL | INTRAVENOUS | Status: DC | PRN
  Administered 2017-11-29: 10 mL
  Filled 2017-11-29: qty 10

## 2017-11-29 MED ORDER — PROCHLORPERAZINE MALEATE 10 MG PO TABS
10.0000 mg | ORAL_TABLET | Freq: Once | ORAL | Status: AC
  Administered 2017-11-29: 10 mg via ORAL

## 2017-11-29 MED ORDER — ACETAMINOPHEN 325 MG PO TABS
ORAL_TABLET | ORAL | Status: AC
  Filled 2017-11-29: qty 2

## 2017-11-29 MED ORDER — SODIUM CHLORIDE 0.9 % IV SOLN
Freq: Once | INTRAVENOUS | Status: AC
  Administered 2017-11-29: 11:00:00 via INTRAVENOUS

## 2017-11-29 MED ORDER — ACETAMINOPHEN 325 MG PO TABS
650.0000 mg | ORAL_TABLET | Freq: Once | ORAL | Status: AC
  Administered 2017-11-29: 650 mg via ORAL

## 2017-11-29 MED ORDER — PACLITAXEL PROTEIN-BOUND CHEMO INJECTION 100 MG
100.0000 mg/m2 | Freq: Once | INTRAVENOUS | Status: AC
  Administered 2017-11-29: 200 mg via INTRAVENOUS
  Filled 2017-11-29: qty 40

## 2017-11-29 MED ORDER — HEPARIN SOD (PORK) LOCK FLUSH 100 UNIT/ML IV SOLN
500.0000 [IU] | Freq: Once | INTRAVENOUS | Status: AC | PRN
  Administered 2017-11-29: 500 [IU]
  Filled 2017-11-29: qty 5

## 2017-11-29 MED ORDER — SODIUM CHLORIDE 0.9 % IJ SOLN
10.0000 mL | INTRAMUSCULAR | Status: DC | PRN
  Administered 2017-11-29: 10 mL via INTRAVENOUS
  Filled 2017-11-29: qty 10

## 2017-11-29 NOTE — Telephone Encounter (Signed)
Gave patient avs and calendar with appts per 1/23 los.

## 2017-11-29 NOTE — Progress Notes (Signed)
Pt presented to infusion room today with loud vocalizations and concerning behavior. This is evidenced by multiple comments made, such as, "I need Dr. Jana Hakim to come in here and talk to me about these papers. He is in and out of the room, and doesn't spend time to explain things, or act like he cares. He doesn't go to symposiums, like that other doctor does. I'm not sure that he has even read these papers that they gave me from [place]." This RN spoke with Dr. [name] nurse to explain her concerns, and she promised to visit the patient because Dr. Jana Hakim had patients in clinic today. When Val, RN came to speak with pt, pt responded, "Why can't Dr. Jana Hakim come in here?" After being told that he was in clinic, she followed up with, "What is breast clinic? That is some excuse."  Once orders were approved by pharmacy, pre-medications were given promptly and IV pump was set to alarm after 30 minute flush per protocol. During the thirty minutes, pt repeatedly verbalized, "Why is it taking so long for my medication. I have been coming here for 19 years and it has never taken this long." This RN explained to the pt that it is protocol to wait thirty minutes after pre-medications before treatment. Pt responded, "Things have really changed around here." Before receiving her first immunotherapy/chemotherapy treatment, the pt said, "I have been waiting entirely too long. I just want to reschedule and come back another day." This RN explained, "You can decide to come another day, but you will have to pay for your visit and get pre-medications again. There is no guarantee that your wait time will be different, and may be the same." Pt did not comment in return.   Abigail Butts, RN flushed first treatment with saline, and pt commented, "These flushes are too long." This RN asked, "How long did this flush last?" Pt responded, "No this flush was fine. Three minutes is normal, but you young nurses do flushes  too long. I have somewhere to be and cannot sit here all day. I got here at eight thirty this morning." While pt was in the bathroom, pt and wife across the way verbalized, "You are doing a really great job responding to her. I wouldn't know what to say." Soon after, pt dropped glass of water on the floor and it shattered. Pt attempted to get up and clean, but was asked to stay in chair to avoid getting cut. While waiting for environmental services to arrive, pt attempted to get up and walk through glass, but was asked again to remain in seat until glass was cleared from her path. Pt did not note need to use the bathroom or other necessity prior to, during, or after glass spill.  Toward the end of her visit, the patient asked this RN, "How long have you been working here?" This RN responded, "A year and a half." Pt said, "Wow! I have never had you before." This RN responded, "I accessed your port this morning before your doctor visit." Pt replied, "No, you didn't, Porsche did." This RN said, "Porsche wasn't here yet, I drew your labs and accessed your port this morning. Do you remember that?" Pt replied, "No, I don't recall that. How did I miss that?"   Prior to discharge, pt was looking at line as her abraxane was flushed. This medication is thick and takes longer to flush than other treatments at the [facility]. Pt said, "Just don't worry  about finishing it. I'm ready to leave." This RN explained that the doctor wrote orders for the pt to receive all of her treatment. The nurses cannot make decisions to give partial treatment." The pt held tubing in her hand and looked at it thoroughly, then expressed, "I think it looks clear, what do you mean it looks milky? I don't understand why this flush has to take so long."   Upon discharge, pt verbalized to this RN, "Aldona Bar, I will remember you when I come back. You are such a sweet heart. You are so good at your job, but you are social like the other young  nurses." Pt hugged and kissed this RN on the neck and responded, "Thank you so much for all of your help today."   Preceptee, notified this RN of comments made by pt after her discharge. At one point pt asked, "How long have you worked here?" After nurses response, pt commented further, "Oh, you young nurses are slowing everything down. When I come back I am going to ask not to have these young nurses." While this RN and other nurses were working with and speaking to other patients and staff, pt would comment, "Did you hear me? Did you hear what I said?" At one point vocalizing, "Goodness, there are a lot of people doing nothing over here. This is ridiculous."   Pt adjacent commented after discharge, "I cannot believe how she acted. I felt like having a fight with her to get her to calm down. My mom taught me better than to act like that."

## 2017-11-29 NOTE — Patient Instructions (Signed)
[facility] Discharge Instructions for Patients Receiving Chemotherapy  Today you received the following chemotherapy agents: trastuzumab (Herceptin), pertuzumab (Perjeta), paclitaxel protein-bound (Abraxane)  To help prevent nausea and vomiting after your treatment, we encourage you to take your nausea medication as prescribed.  If you develop nausea and vomiting that is not controlled by your nausea medication, call the clinic.   BELOW ARE SYMPTOMS THAT SHOULD BE REPORTED IMMEDIATELY:  *FEVER GREATER THAN 100.5 F  *CHILLS WITH OR WITHOUT FEVER  NAUSEA AND VOMITING THAT IS NOT CONTROLLED WITH YOUR NAUSEA MEDICATION  *UNUSUAL SHORTNESS OF BREATH  *UNUSUAL BRUISING OR BLEEDING  TENDERNESS IN MOUTH AND THROAT WITH OR WITHOUT PRESENCE OF ULCERS  *URINARY PROBLEMS  *BOWEL PROBLEMS  UNUSUAL RASH Items with * indicate a potential emergency and should be followed up as soon as possible.  Feel free to call the clinic should you have any questions or concerns. The clinic phone number is [phone].  Please show the [facility] at check-in to the Emergency Department and triage nurse.

## 2017-11-29 NOTE — Patient Instructions (Signed)

## 2017-11-30 ENCOUNTER — Other Ambulatory Visit: Payer: Self-pay | Admitting: Oncology

## 2017-12-07 ENCOUNTER — Telehealth: Payer: Self-pay | Admitting: *Deleted

## 2017-12-07 NOTE — Telephone Encounter (Signed)
This RN spoke with pt per her call stating " my children think I have alzheimer's because I am forgetting a lot of things" " how do I get tested for it "  This RN informed pt above concern needs to be assessed first by her primary MD - Dr [name] who can then if needed refer her to appropriate MD if warranted.  This RN inquired about any concerns for brain mets with [name] denying any nausea or frequent falls or dizziness "but my daughter says I am easy to tip over "  This RN reiterated pt is on 3 potent pain medications which in themselves can cause memory loss - she also may have increased memory loss post chemotherapy due to metabolism of medications.   This RN also informed pt in addition to the above if she was to have a beer or other alcohol or other recreational drugs she would definitely have increased memory loss as well as not be as steady on her legs.  This RN informed pt her symptoms do not sound like brain mets but more drug related - if she feels she wants to follow up for possible alzheimer's testing she should see Dr [name].  Lynleigh verbalized appreciation - she will call Dr Doristine Devoid if she wants further work up. She understands to call if further concerns arise.

## 2017-12-20 ENCOUNTER — Ambulatory Visit: Payer: Commercial Managed Care - PPO

## 2017-12-26 IMAGING — DX DG HAND COMPLETE 3+V*L*
3 series · 3 of 3 positions shown · non-contrast
Comparison: None.

ADDENDUM:
Voice recognition error in the impression. Impression should read as
follows:

No acute osseous injury of the left hand.
CLINICAL DATA: Patient states that she fell while mopping her floor
3 days ago, pain and swelling in the left hand.
EXAM:
LEFT HAND - COMPLETE 3+ VIEW

[hand pa]
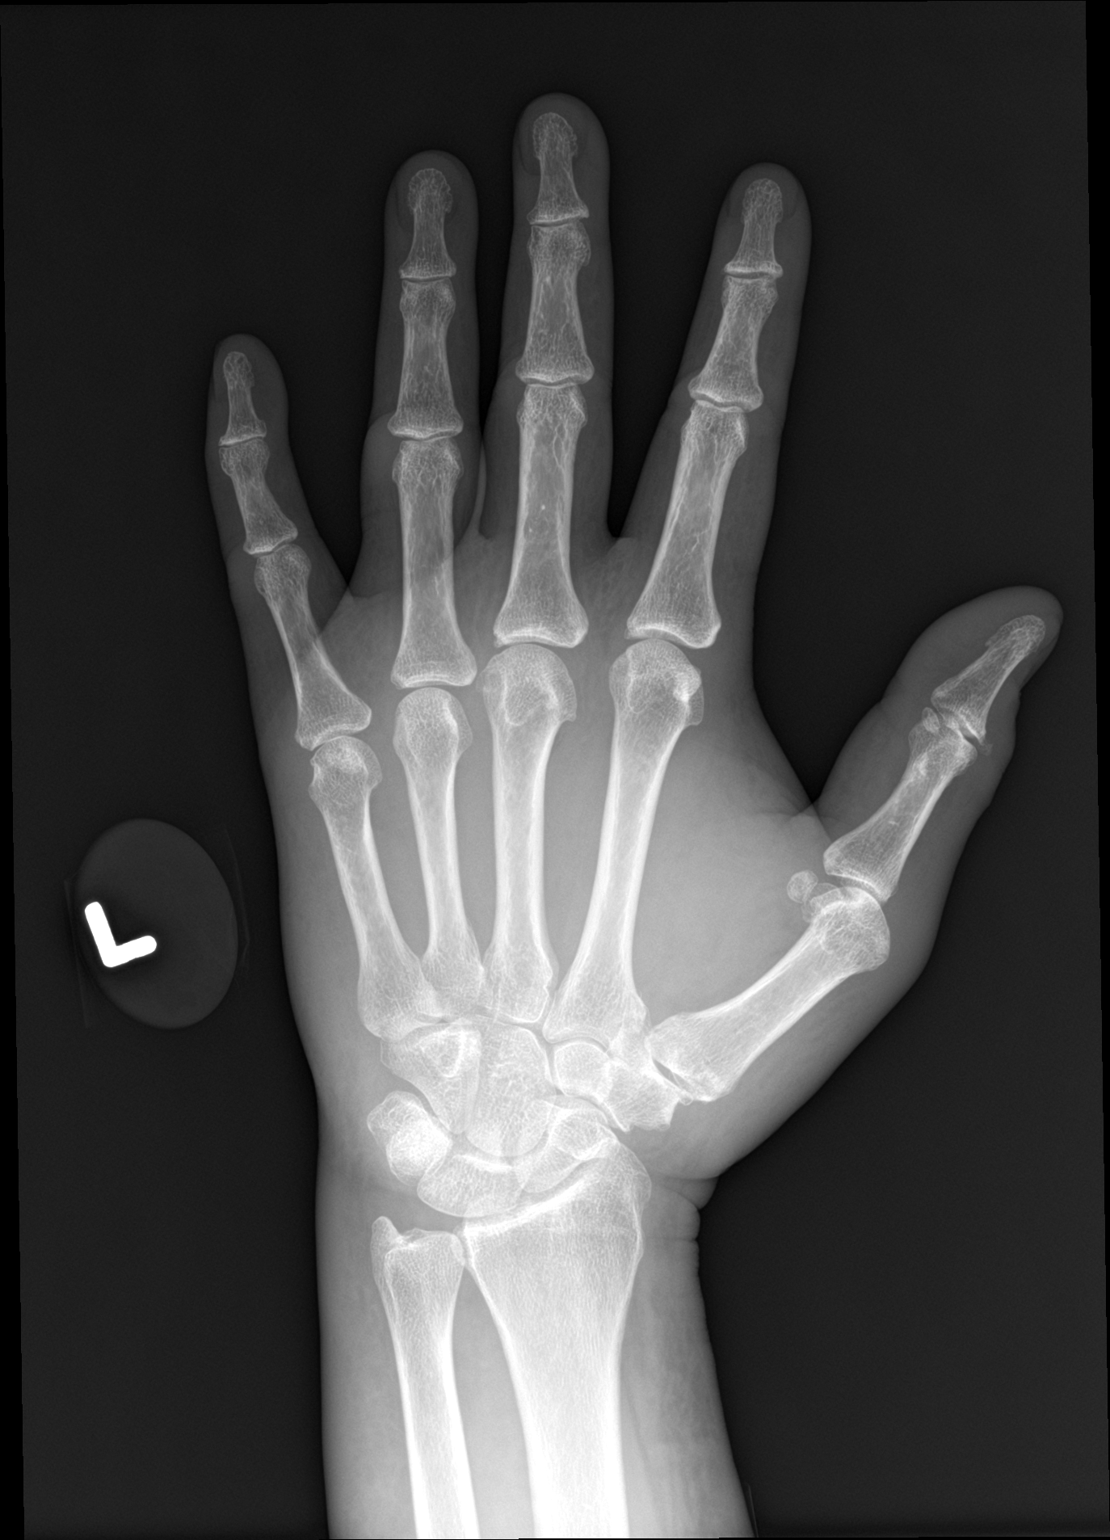

[hand obl]
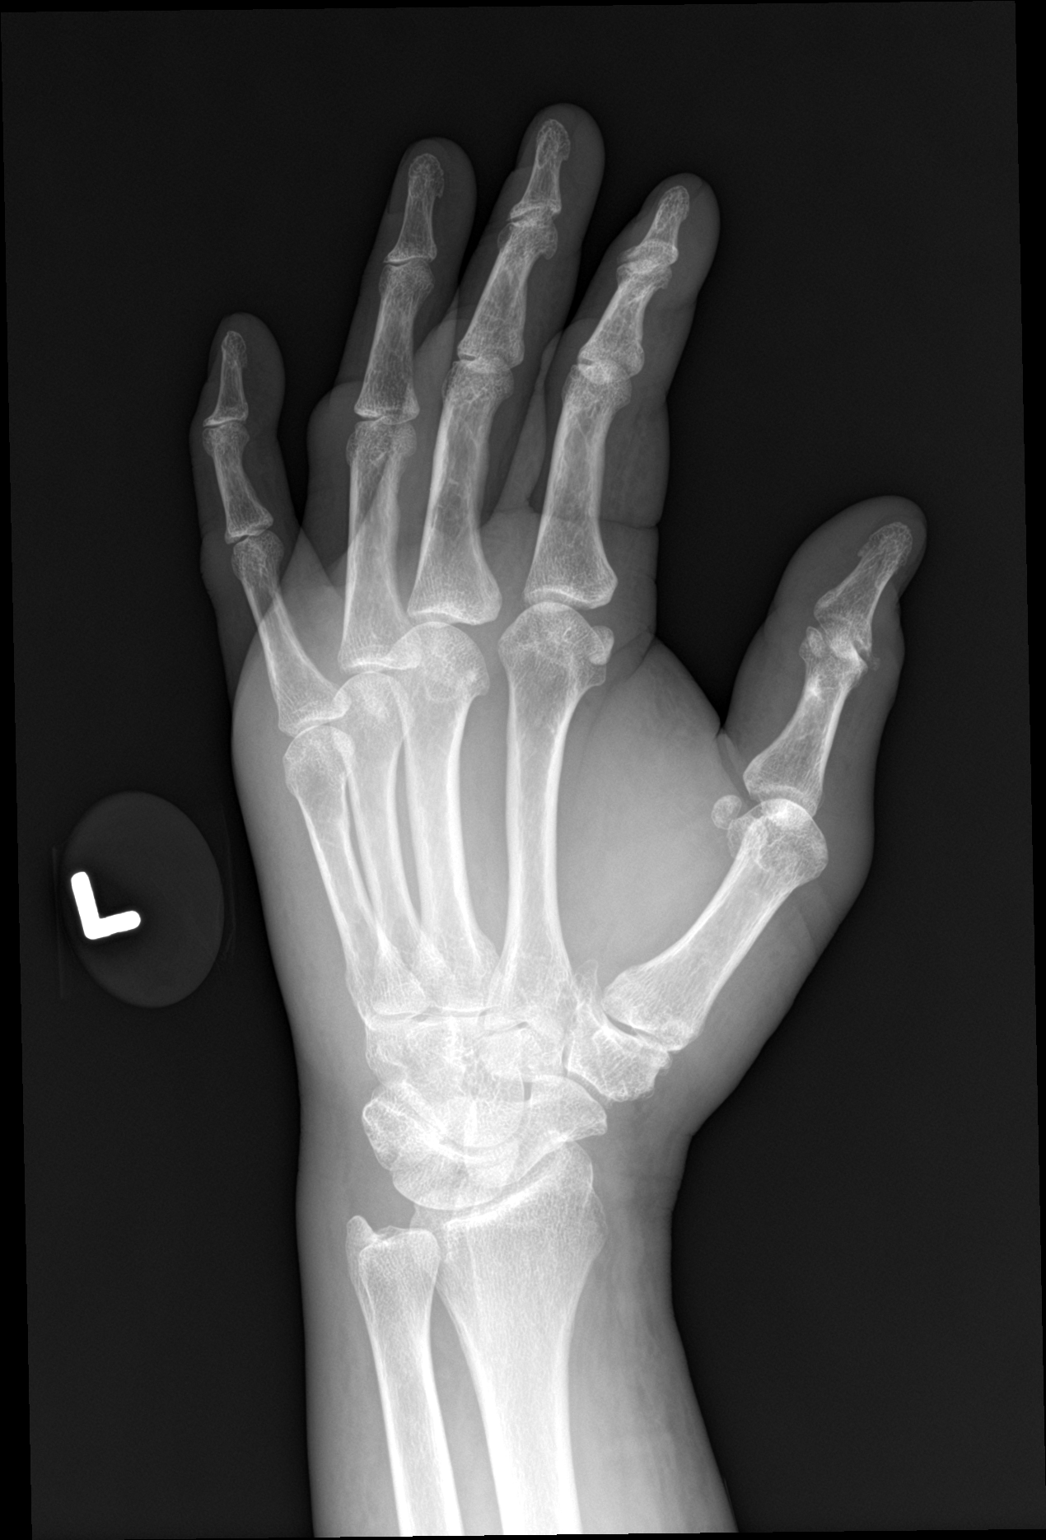

[hand lat]
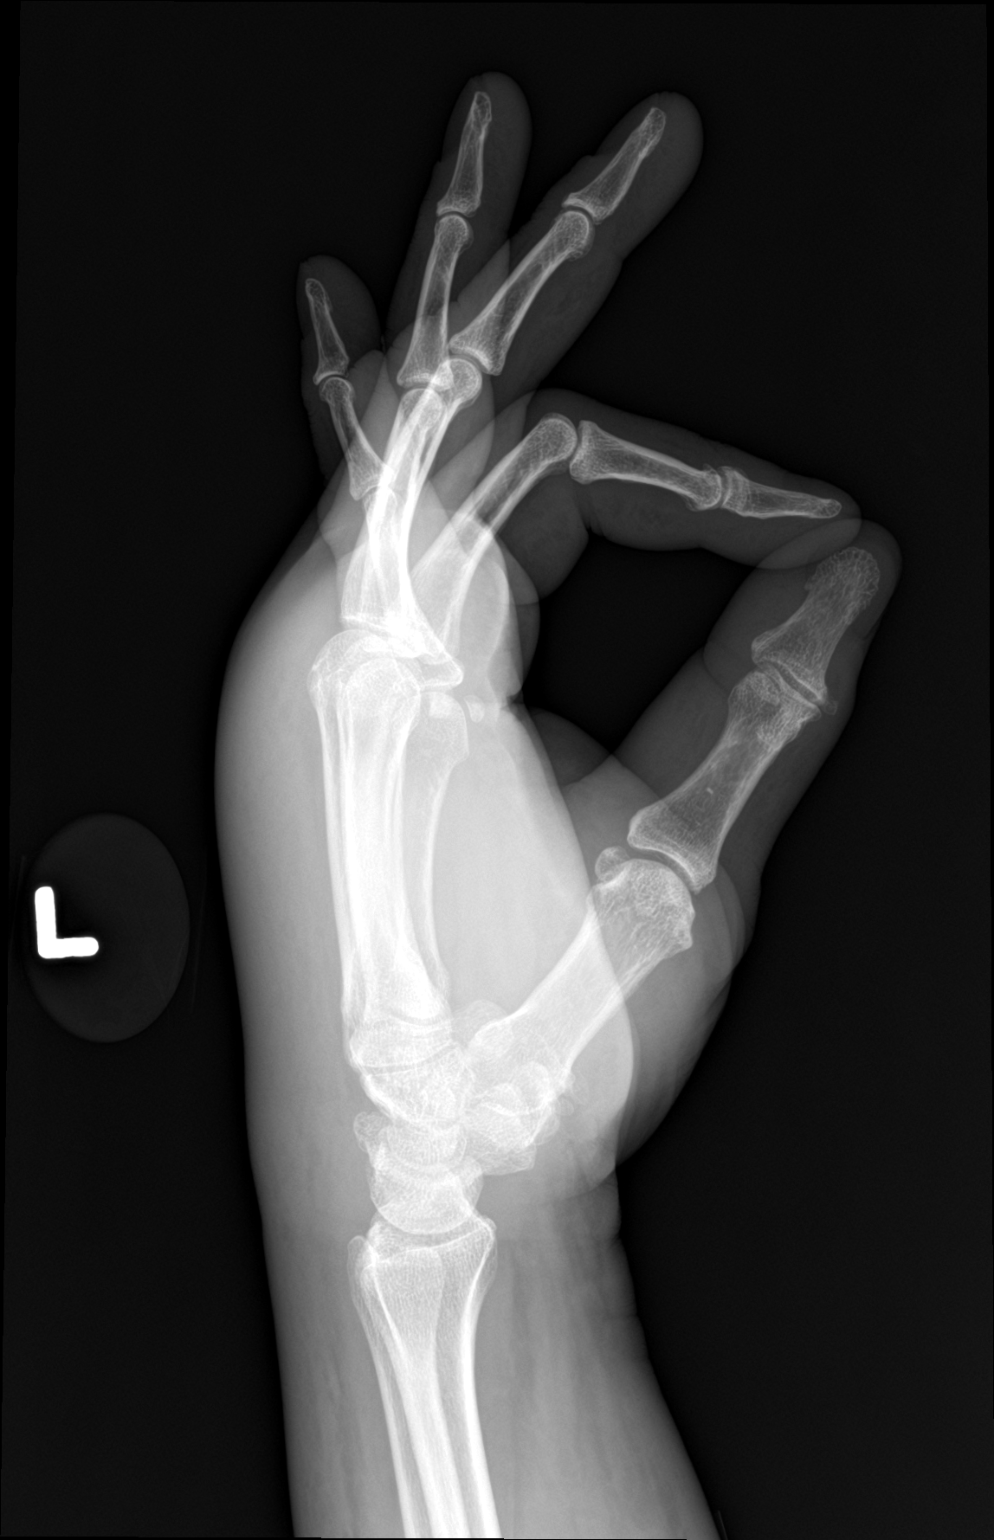

[3 of 3 positions shown; findings below may reference images not displayed]

FINDINGS: No acute fracture or dislocation. Mild osteoarthritis of the first
CMC joint and first IP joint. Mild osteoarthritis of the DIP joints.
Mild osteoarthritis of the first MCP joint, second MCP joint and
third MCP joint. No aggressive osseous lesion. Severe soft tissue
swelling along the dorsal aspect of the left hand.
IMPRESSION: 1.  No acute osseous injury of the left and.

## 2017-12-27 ENCOUNTER — Inpatient Hospital Stay: Payer: Commercial Managed Care - PPO

## 2017-12-27 ENCOUNTER — Inpatient Hospital Stay: Payer: Commercial Managed Care - PPO | Attending: Oncology

## 2017-12-27 ENCOUNTER — Other Ambulatory Visit: Payer: Self-pay | Admitting: *Deleted

## 2017-12-27 VITALS — BP 112/74 | HR 74 | Temp 98.0°F | Resp 20

## 2017-12-27 DIAGNOSIS — Z5112 Encounter for antineoplastic immunotherapy: Secondary | ICD-10-CM | POA: Insufficient documentation

## 2017-12-27 DIAGNOSIS — Z95828 Presence of other vascular implants and grafts: Secondary | ICD-10-CM

## 2017-12-27 DIAGNOSIS — Z170 Estrogen receptor positive status [ER+]: Secondary | ICD-10-CM

## 2017-12-27 DIAGNOSIS — C50211 Malignant neoplasm of upper-inner quadrant of right female breast: Secondary | ICD-10-CM | POA: Insufficient documentation

## 2017-12-27 DIAGNOSIS — C4911 Malignant neoplasm of connective and soft tissue of right upper limb, including shoulder: Secondary | ICD-10-CM

## 2017-12-27 DIAGNOSIS — C778 Secondary and unspecified malignant neoplasm of lymph nodes of multiple regions: Secondary | ICD-10-CM | POA: Diagnosis not present

## 2017-12-27 DIAGNOSIS — Z5111 Encounter for antineoplastic chemotherapy: Secondary | ICD-10-CM | POA: Insufficient documentation

## 2017-12-27 LAB — CBC WITH DIFFERENTIAL/PLATELET
Basophils Absolute: 0 10*3/uL (ref 0.0–0.1)
Basophils Relative: 0 %
Eosinophils Absolute: 0.3 10*3/uL (ref 0.0–0.5)
Eosinophils Relative: 4 %
HCT: 38 % (ref 34.8–46.6)
Hemoglobin: 11.6 g/dL (ref 11.6–15.9)
Lymphocytes Relative: 22 %
Lymphs Abs: 1.7 10*3/uL (ref 0.9–3.3)
MCH: 27.2 pg (ref 25.1–34.0)
MCHC: 30.5 g/dL — ABNORMAL LOW (ref 31.5–36.0)
MCV: 89.2 fL (ref 79.5–101.0)
Monocytes Absolute: 0.7 10*3/uL (ref 0.1–0.9)
Monocytes Relative: 9 %
Neutro Abs: 5.1 10*3/uL (ref 1.5–6.5)
Neutrophils Relative %: 65 %
Platelets: 192 10*3/uL (ref 145–400)
RBC: 4.26 MIL/uL (ref 3.70–5.45)
RDW: 16.2 % — ABNORMAL HIGH (ref 11.2–14.5)
WBC: 7.9 10*3/uL (ref 3.9–10.3)

## 2017-12-27 LAB — COMPREHENSIVE METABOLIC PANEL
ALT: 9 U/L (ref 0–55)
AST: 21 U/L (ref 5–34)
Albumin: 3.5 g/dL (ref 3.5–5.0)
Alkaline Phosphatase: 122 U/L (ref 40–150)
Anion gap: 10 (ref 3–11)
BUN: 11 mg/dL (ref 7–26)
CO2: 32 mmol/L — ABNORMAL HIGH (ref 22–29)
Calcium: 9.1 mg/dL (ref 8.4–10.4)
Chloride: 100 mmol/L (ref 98–109)
Creatinine, Ser: 0.94 mg/dL (ref 0.60–1.10)
GFR calc Af Amer: >60 mL/min (ref >60)
GFR calc non Af Amer: >60 mL/min (ref >60)
Glucose, Bld: 97 mg/dL (ref 70–140)
Potassium: 3.8 mmol/L (ref 3.5–5.1)
Sodium: 142 mmol/L (ref 136–145)
Total Bilirubin: 0.2 mg/dL (ref 0.2–1.2)
Total Protein: 7.4 g/dL (ref 6.4–8.3)

## 2017-12-27 MED ORDER — ACETAMINOPHEN 325 MG PO TABS
650.0000 mg | ORAL_TABLET | Freq: Once | ORAL | Status: AC
  Administered 2017-12-27: 650 mg via ORAL

## 2017-12-27 MED ORDER — TRASTUZUMAB CHEMO 150 MG IV SOLR
6.0000 mg/kg | Freq: Once | INTRAVENOUS | Status: AC
  Administered 2017-12-27: 525 mg via INTRAVENOUS
  Filled 2017-12-27: qty 25

## 2017-12-27 MED ORDER — DIPHENHYDRAMINE HCL 25 MG PO CAPS
ORAL_CAPSULE | ORAL | Status: AC
  Filled 2017-12-27: qty 1

## 2017-12-27 MED ORDER — SODIUM CHLORIDE 0.9 % IJ SOLN
10.0000 mL | INTRAMUSCULAR | Status: DC | PRN
  Administered 2017-12-27: 10 mL via INTRAVENOUS
  Filled 2017-12-27: qty 10

## 2017-12-27 MED ORDER — SODIUM CHLORIDE 0.9 % IV SOLN
420.0000 mg | Freq: Once | INTRAVENOUS | Status: AC
  Administered 2017-12-27: 420 mg via INTRAVENOUS
  Filled 2017-12-27: qty 14

## 2017-12-27 MED ORDER — METHADONE HCL 10 MG PO TABS: 20.0000 mg | ORAL_TABLET | Freq: Two times a day (BID) | ORAL | 0 refills | Status: DC

## 2017-12-27 MED ORDER — DIPHENHYDRAMINE HCL 25 MG PO CAPS
25.0000 mg | ORAL_CAPSULE | Freq: Once | ORAL | Status: AC
  Administered 2017-12-27: 25 mg via ORAL

## 2017-12-27 MED ORDER — HEPARIN SOD (PORK) LOCK FLUSH 100 UNIT/ML IV SOLN
500.0000 [IU] | Freq: Once | INTRAVENOUS | Status: AC | PRN
  Administered 2017-12-27: 500 [IU]
  Filled 2017-12-27: qty 5

## 2017-12-27 MED ORDER — SODIUM CHLORIDE 0.9 % IV SOLN
Freq: Once | INTRAVENOUS | Status: AC
  Administered 2017-12-27: 13:00:00 via INTRAVENOUS

## 2017-12-27 MED ORDER — PROCHLORPERAZINE MALEATE 10 MG PO TABS
ORAL_TABLET | ORAL | Status: AC
  Filled 2017-12-27: qty 1

## 2017-12-27 MED ORDER — SODIUM CHLORIDE 0.9 % IV SOLN
Freq: Once | INTRAVENOUS | Status: DC
Start: 2017-12-27 — End: 2017-12-27

## 2017-12-27 MED ORDER — PACLITAXEL PROTEIN-BOUND CHEMO INJECTION 100 MG
100.0000 mg/m2 | Freq: Once | Status: AC
  Administered 2017-12-27: 200 mg via INTRAVENOUS
  Filled 2017-12-27: qty 40

## 2017-12-27 MED ORDER — SODIUM CHLORIDE 0.9% FLUSH
10.0000 mL | INTRAVENOUS | Status: DC | PRN
  Administered 2017-12-27: 10 mL
  Filled 2017-12-27: qty 10

## 2017-12-27 MED ORDER — ACETAMINOPHEN 325 MG PO TABS
ORAL_TABLET | ORAL | Status: AC
  Filled 2017-12-27: qty 2

## 2017-12-27 MED ORDER — PROCHLORPERAZINE MALEATE 10 MG PO TABS
10.0000 mg | ORAL_TABLET | Freq: Once | ORAL | Status: AC
  Administered 2017-12-27: 10 mg via ORAL

## 2017-12-27 NOTE — Patient Instructions (Signed)
[facility] Discharge Instructions for Patients Receiving Chemotherapy  Today you received the following chemotherapy agents: Herceptin, Perjeta, Abraxane  To help prevent nausea and vomiting after your treatment, we encourage you to take your nausea medication as prescribed.  If you develop nausea and vomiting that is not controlled by your nausea medication, call the clinic.   BELOW ARE SYMPTOMS THAT SHOULD BE REPORTED IMMEDIATELY:  *FEVER GREATER THAN 100.5 F  *CHILLS WITH OR WITHOUT FEVER  NAUSEA AND VOMITING THAT IS NOT CONTROLLED WITH YOUR NAUSEA MEDICATION  *UNUSUAL SHORTNESS OF BREATH  *UNUSUAL BRUISING OR BLEEDING  TENDERNESS IN MOUTH AND THROAT WITH OR WITHOUT PRESENCE OF ULCERS  *URINARY PROBLEMS  *BOWEL PROBLEMS  UNUSUAL RASH Items with * indicate a potential emergency and should be followed up as soon as possible.  Feel free to call the clinic should you have any questions or concerns. The clinic phone number is [phone].  Please show the [facility] at check-in to the Emergency Department and triage nurse.

## 2017-12-27 NOTE — Progress Notes (Signed)
Per Dr. Jana Hakim, ok to treat with echo from 07/20/17.

## 2018-01-11 ENCOUNTER — Telehealth: Payer: Self-pay | Admitting: *Deleted

## 2018-01-11 ENCOUNTER — Other Ambulatory Visit: Payer: Self-pay | Admitting: *Deleted

## 2018-01-11 MED ORDER — ALPRAZOLAM 2 MG PO TABS: 2.0000 mg | ORAL_TABLET | Freq: Every day | ORAL | 0 refills | Status: DC

## 2018-01-11 NOTE — Telephone Encounter (Signed)
This RN returned " urgent " VM from pt and obtained identified VM - message left to return call.

## 2018-01-11 NOTE — Telephone Encounter (Signed)
This RN

## 2018-01-16 ENCOUNTER — Telehealth: Payer: Self-pay | Admitting: *Deleted

## 2018-01-16 ENCOUNTER — Inpatient Hospital Stay (HOSPITAL_COMMUNITY): Admission: RE | Admit: 2018-01-16 | Payer: Commercial Managed Care - PPO | Source: Ambulatory Visit

## 2018-01-16 ENCOUNTER — Ambulatory Visit (HOSPITAL_COMMUNITY)
Admission: EM | Admit: 2018-01-16 | Discharge: 2018-01-16 | Disposition: A | Discharge status: Home / Self Care | Payer: Commercial Managed Care - PPO

## 2018-01-16 NOTE — Telephone Encounter (Signed)
This RN returned call to pt per her VM stating " why do I have back to back appointments to see [name] "   Obtained identified VM- message left informing pt appointment for 3/13 was made per her call stating need for pain medications that was previously dispensed by Dr [name]- per necessity - visit needed with MD for assessment and evaluation.  Appointments the following week is for chemo - and MD appointment can be cancelled on that date after she completes visit on 3/13.

## 2018-01-16 NOTE — Progress Notes (Signed)
[facility]  [phone] [phone]) [phone]     ID: [name]   DOB: 03/12/1956  MR#: [id number]  OTL#:[id number]  Patient Care Team: Anniston Nellums, [name], MD as Consulting Physician (Oncology) [name], MD as Consulting Physician (Radiation Oncology) Excell Seltzer, MD as Consulting Physician (General Surgery) Stark Klein, MD as Consulting Physician (General Surgery) [name], [name], MD as Consulting Physician (Cardiology) [name], [name] 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  She sought attention and had a mammogram which showed an obvious carcinoma in the upper inner quadrant of the right breast, approximately 2 cm.  There were some enlarged lymph nodes in the axilla and an FNA done showed those consistent with malignant cells, most likely an invasive ductal carcinoma.  At that point she was unsure of what to do and was referred by Dr. [name] for a discussion of her treatment options.  By biopsy, it was ER/PR negative and HER-2 was 3+.  DNA index was 1.42.  We reiterated that it was most important to have her disease surgically addressed at that point and had recommended lumpectomy and axillary nodes to be addressed with which she followed through and on 04-03-02, Dr. [name] performed a right partial mastectomy and a right axillary lymph node dissection.  Final pathology revealed a 2.4 cm. high grade, Grade III invasive ductal carcinoma with an adjacent .8 cm. also high grade invasive ductal carcinoma which was felt to represent an intramammary metastasis rather than a second primary.  A smaller mass was just medial to  the larger mass.  The smaller mass was associated with high grade DCIS component.  There was no definite lymphovascular invasion identified.  However, one of fourteen axillary lymph nodes did contain a 1.5 cm. metastatic deposit.  Postoperatively she did very well.  We reiterated the fact that she did need adjuvant chemotherapy, however, she refused and decided that she would pursue radiation.  She received radiation and completed that on 08-14-02. Her subsequent history is as detailed below.   INTERVAL HISTORY: [name] returns today for a follow-up of her stage IV HER-2 positive breast cancer.  Since her last visit here her primary care physician who was giving her her pain medication lost his license and I believe it is in legal trouble.  She is here today to discuss that problem  REVIEW OF SYSTEMS: [name] is doing better, overall.  However, she recently is not doing well because she has not been able to get certain medications filled since her PCP was arrested. She has been experiencing diarrhea since not being on some of her medications. She denies unusual headaches, visual changes, nausea, vomiting, or dizziness. There has been no unusual cough, phlegm production, or pleurisy. This been no change in bladder habits. She denies unexplained fatigue or unexplained weight loss, bleeding, rash, or fever. A detailed review of systems was otherwise noncontributory.    PAST MEDICAL HISTORY: Past Medical History:  Diagnosis Date  . Breast cancer ([place])   . DVT (deep venous thrombosis) ([place]) 10/07/2011  . Hypertension   . Radiation 11/29/2005-12/29/2005   right supraclavicular area 4147 cGy  . Radiation 06/26/02-08/14/02   right breast 5040 cGy, tumor  bed boosted to 1260 cGy  . Rheumatic fever     PAST SURGICAL HISTORY: Past Surgical History:  Procedure Laterality Date  . BREAST SURGERY    . MASTECTOMY     bilateral  . PORT A CATH REVISION      FAMILY HISTORY Family History  Problem  Relation Age of Onset  . Pancreatic cancer Mother   . Kidney cancer Sister 27  . Breast cancer Sister 49  . Breast cancer Other 12  The patient's father died in his 63T  from complications of alcohol abuse. The patient's mother died in her 63s from pancreatic cancer. The patient has 6 brothers, 2 sisters. Both her sisters have had breast cancer, both diagnosed after the age of 63. The patient has not had genetic testing so far.   GYNECOLOGIC HISTORY: Menarche age 62; first live birth age 62. She is GXP4. She is not sure when she stopped having periods. She never used hormone replacement.  SOCIAL HISTORY: The patient has worked in the past as a Psychologist, counselling. At home in addition to the patient are her husband [name], originally from [place], who works as a Administrator.  Also living in the home is her daughter [name] (she is studying to be a Marine scientist).  The other 2 children are [name], who has a college degree in psychology and works as a Probation officer; and [name], who lives in [place] and works as a Higher education careers adviser.[name] also sometimes stays in the home. In addition the patient has an aid who helps her almost daily.   ADVANCED DIRECTIVES: Not in place  HEALTH MAINTENANCE: Social History   Tobacco Use  . Smoking status: Light Tobacco Smoker  . Smokeless tobacco: Never Used  Substance Use Topics  . Alcohol use: Yes    Comment: Rarely  . Drug use: No     Colonoscopy:  Not on file  PAP:  Not on file  Bone density:  Not on file  Lipid panel:  Not on file    Allergies  Allergen Reactions  . Pork-Derived Products Other (See Comments)    Pt says she just doesn't eat pork; has no reaction to pork products  . Tramadol Nausea Only  . Hydrocodone-Acetaminophen Itching and Rash    Current Outpatient Medications  Medication Sig Dispense Refill  . alprazolam (XANAX) 2 MG tablet Take 1 tablet (2 mg total) by mouth daily. 30 tablet 0  . Ascorbic Acid (VITAMIN C) 100 MG tablet Take 100 mg  by mouth daily.      . calcium-vitamin D (OSCAL WITH D) 500-200 MG-UNIT per tablet Take 1 tablet by mouth daily.      Marland Kitchen gabapentin (NEURONTIN) 300 MG capsule Take 1 capsule (300 mg total) by mouth daily. 90 capsule 3  . methadone (DOLOPHINE) 10 MG tablet Take 2 tablets (20 mg total) by mouth every 12 (twelve) hours. Note new dosing per pt's regimen for best control 120 tablet 0  . Multiple Vitamin (MULTIVITAMIN) capsule Take 1 capsule by mouth daily.      . mupirocin nasal ointment (BACTROBAN NASAL) 2 % Place 1 application into the nose 2 (two) times daily. Use one-half of tube in each nostril twice daily for five (5) days. 10 g 0  . oxyCODONE-acetaminophen (PERCOCET) 10-325 MG tablet Take 1 tablet by mouth every 4 (four) hours as needed for pain. 120 tablet 0  . potassium chloride (MICRO-K) 10 MEQ CR capsule Take 2 capsules (20 mEq total) by mouth daily. 60 capsule 3  .  prochlorperazine (COMPAZINE) 10 MG tablet Take 1 tablet by mouth every 6 (six) hours as needed.    . sertraline (ZOLOFT) 50 MG tablet Take 1 tablet (50 mg total) by mouth daily. 30 tablet 3  . triamterene-hydrochlorothiazide (DYAZIDE) 37.5-25 MG capsule Take 1 each (1 capsule total) by mouth every morning. 30 capsule 0   No current facility-administered medications for this visit.    Facility-Administered Medications Ordered in Other Visits  Medication Dose Route Frequency Provider Last Rate Last Dose  . sodium chloride 0.9 % injection 10 mL  10 mL Intravenous PRN Jaeger Trueheart, [name], MD   10 mL at 03/04/14 1421    OBJECTIVE: Middle-aged African-American woman who appears stated age  11:   01/17/18 1013  BP: 105/77  Pulse: 79  Resp: 18  Temp: 98.6 F (37 C)  SpO2: 100%     Body mass index is 29.55 kg/m.    ECOG FS: 1 Filed Weights   01/17/18 1013  Weight: 183 lb 1.6 oz (83.1 kg)   Sclerae unicteric, EOMs intact Lungs no rales or rhonchi Heart regular rate and rhythm Abd soft, nontender, positive bowel  sounds MSK no focal spinal tenderness, no upper extremity lymphedema Neuro: nonfocal, well oriented, appropriate affect Breasts: Deferred Skin and right arm: See image below   Right Arm and Back 01/17/2018      Photo 01 /23 /2019     Photo 07/04/2017     Photo 04/06/2017    LAB RESULTS:.  Lab Results  Component Value Date   WBC 7.9 12/27/2017   NEUTROABS 5.1 12/27/2017   HGB 11.6 12/27/2017   HCT 38.0 12/27/2017   MCV 89.2 12/27/2017   PLT 192 12/27/2017      Chemistry      Component Value Date/Time   NA 142 12/27/2017 1130   NA 142 11/09/2017 1214   K 3.8 12/27/2017 1130   K 3.9 11/09/2017 1214   CL 100 12/27/2017 1130   CL 101 03/07/2013 1411   CO2 32 (H) 12/27/2017 1130   CO2 32 (H) 11/09/2017 1214   BUN 11 12/27/2017 1130   BUN 19.1 11/09/2017 1214   CREATININE 0.94 12/27/2017 1130   CREATININE 1.0 11/09/2017 1214      Component Value Date/Time   CALCIUM 9.1 12/27/2017 1130   CALCIUM 9.1 11/09/2017 1214   ALKPHOS 122 12/27/2017 1130   ALKPHOS 120 11/09/2017 1214   AST 21 12/27/2017 1130   AST 30 11/09/2017 1214   ALT 9 12/27/2017 1130   ALT 19 11/09/2017 1214   BILITOT 0.2 12/27/2017 1130   BILITOT <0.22 11/09/2017 1214       STUDIES: No results found.  ASSESSMENT:  62 y.o. [place] woman with stage IV breast cancer manifested chiefly by loco-regional nodal disease (neck, chest) and skin involvement, without liver, bone, or brain metastases documented   (1) Status post right upper inner quadrant lumpectomy and sentinel lymph node sampling 03/04/2002 for 2 separate foci of invasive ductal carcinoma, mpT2 pN1 or stage IIB, both foci grade 3, both estrogen and progesterone receptor positive, both HercepTest 3+, Mib-1 56%  (2) Reexcision for margins 05/27/2002 showed no residual cancer in the breast.  (3) The patient refused adjuvant systemic therapy.  (4) Adjuvant radiation treatment completed 08/14/2002.  (5) recurrence in the  right breast in 02/2004 showing a morphologically different tumor, again grade 3, again estrogen and progesterone receptor negative, with an MIB-1 of 14% and Herceptest 3+.  (5) Between 03/2004 and 07/2004 she received  dose dense Doxorubicin/Cyclophosphamide x 4 given with trastuzumab, followed by weekly Docetaxel x 8, again given with trastuzumab.  (6) Right mastectomy 07/13/2004 showed scattered microscopic foci of residual disease over an area  greater than 5 cm. Margins were negative.  (7) Postoperative Docetaxel continued until 09/2004.  (8) Trastuzumab (Herceptin) given 08/2004 through 01/2012 with some brief interruptions.  (9) Isolated right cervical nodal recurrence 10/2005, treated with radiation to the right supraclavicular area (total 41.5 gray) completed 12/29/2005.  (10) Navelbine given together with Herceptin  11/2005 through 03/2006.  (9) Left mastectomy 02/13/2006 for ductal carcinoma in situ, grade 2, estrogen and progesterone receptor negative, with negative margins; 0 of 3 lymph nodes involved  (10) treated with Lapatinib and Capecitabine before 10/2009, for an unclear duration and with unclear results (cannot locate data on chart review).  (11) Status post right supraclavicular lymph node biopsy 09/2010 again positive for an invasive ductal carcinoma, estrogen and progesterone receptor negative, HER-2 positive by CISH with a ratio 4.25.  (12) Navelbine given together with Herceptin between 05/2011 and 11/2011.  (13) Carboplatin/ Gemcitabine/ Herceptin given for 2 cycles, in 12/2011 and 01/2012.  (14) TDM-1 ([place]) started 02/2012. Last dose 10/02/2013 after which the patient discontinued treatment at her own discretion. Echo on December 2014 showed a well preserved ejection fraction.  (15) Deep vein thrombosis of the right upper extremity documented 04/20/2011.  She completed anticoagulant therapy with Coumadin on 03/25/2013.  (16) Chronic right upper extremity  lymphedema, not responsive to aggressive PT  (a) biopsy of denuded area 04/23/2015 read as dermatofibroma (discordant)  (b) deeper cuts of 04/23/2015 biopsy suggest angiosarcoma  (17) Right chest port-a-cath removal due to infection on 01/28/2013. Left chest Port-A-Cath placed on 04/08/2013; being flushed every 6 weeks  RIGHT UPPER EXTREMITY ANGIOSARCOMA VS BREAST CANCER: August 2016 (18) treated at [facility] [place] August 2016 with paclitaxel, trastuzumab and pertuzumab 1 cycle, afterwards referred to hospice  (19) under the care of hospice of [facility] August 2016 to 05/08/2016, when the patient opted for a second try at chemotherapy  (20) started low-dose Abraxane, trastuzumab and pertuzumab 06/07/2016, to be repeated every 3 weeks.   (a) pretreatment echocardiogram 06/06/2016 showed a 60-65% ejection fraction  (b) significant compliance problems compromise response to treatment  (c)  echocardiogram 02/20/17 LVEF 55-60%  (d) Abraxane discontinued 07/04/2017 because of possible neuropathy symptoms  (e) Abraxane resumed 09/27/2017  (f) echocardiogram 07/20/2017 showed an ejection fraction of 60-65%  (21) Chronic pain secondary to known metastatic disease  (a) patient signed pain contract 10/19/2016  (b) as of 11/16/2016 receives her pain medications from Dr [name]  (c) we prescribe methadone to the patient but no other narcotics (currently 10 mg twice daily)   PLAN: Zuma is doing remarkably well on Abraxane, trastuzumab and Pertuzumab.  She is currently having some diarrhea, possibly related to the Pertuzumab, and we will hold that at the next dose, which is scheduled for 01/24/2018.  Sherell was receiving other narcotics through Dr. [name].  He apparently has lost his license.  She is very distraught because she was not taking the methadone that we prescribed but was using the medications that he did prescribe.  He also had her on very high doses of alprazolam.  I  explained to Ara that I do not feel comfortable using those medications.  I use methadone for pain and I have had a good deal of success with many patients controlling chronic pain on that basis.  The drug essentially has no  abuse potential and she does not have to be worried about people stealing her drugs as has happened in the past.  Ameliarose is not very happy with this.  She really wants to be back on the medications that Dr. [name] prescribed for her.  We are making a referral to a pain clinic so that she can discuss this further and perhaps obtain the medications that she wishes to half.  She understands that I am not comfortable prescribing Xanax for her.  I did prescribe Atarax and she may use that at her discretion.  I reviewed with her how to take the methadone which is 10 mg 3 times a day.  I did give her a prescription for Percocet 10/325 to take up to 4 times a day as needed for breakthrough pain  She will see me again on 01/24/2018 which is her next treatment date   Annelle Behrendt, [name], MD  01/17/18 10:43 AM Medical Oncology and Hematology [facility] [place], [place] [address] Tel. [phone]    Fax. [phone]  This document serves as a record of services personally performed by Sullivan Lone, MD. It was created on his behalf by Margit Banda, a trained medical scribe. The creation of this record is based on the scribe's personal observations and the provider's statements to them.   I have reviewed the above documentation for accuracy and completeness, and I agree with the above.

## 2018-01-17 ENCOUNTER — Inpatient Hospital Stay: Payer: Commercial Managed Care - PPO | Attending: Oncology | Admitting: Oncology

## 2018-01-17 ENCOUNTER — Other Ambulatory Visit: Payer: Self-pay | Admitting: *Deleted

## 2018-01-17 VITALS — BP 105/77 | HR 79 | Temp 98.6°F | Resp 18 | Ht 66.0 in | Wt 183.1 lb

## 2018-01-17 DIAGNOSIS — C4911 Malignant neoplasm of connective and soft tissue of right upper limb, including shoulder: Secondary | ICD-10-CM

## 2018-01-17 DIAGNOSIS — C50211 Malignant neoplasm of upper-inner quadrant of right female breast: Secondary | ICD-10-CM | POA: Insufficient documentation

## 2018-01-17 DIAGNOSIS — Z5111 Encounter for antineoplastic chemotherapy: Secondary | ICD-10-CM | POA: Insufficient documentation

## 2018-01-17 DIAGNOSIS — Z9011 Acquired absence of right breast and nipple: Secondary | ICD-10-CM | POA: Insufficient documentation

## 2018-01-17 DIAGNOSIS — I890 Lymphedema, not elsewhere classified: Principal | ICD-10-CM

## 2018-01-17 DIAGNOSIS — C792 Secondary malignant neoplasm of skin: Secondary | ICD-10-CM | POA: Diagnosis not present

## 2018-01-17 DIAGNOSIS — G893 Neoplasm related pain (acute) (chronic): Secondary | ICD-10-CM | POA: Diagnosis not present

## 2018-01-17 DIAGNOSIS — M79602 Pain in left arm: Secondary | ICD-10-CM

## 2018-01-17 DIAGNOSIS — Z170 Estrogen receptor positive status [ER+]: Secondary | ICD-10-CM | POA: Diagnosis not present

## 2018-01-17 DIAGNOSIS — Z5112 Encounter for antineoplastic immunotherapy: Secondary | ICD-10-CM | POA: Insufficient documentation

## 2018-01-17 DIAGNOSIS — C778 Secondary and unspecified malignant neoplasm of lymph nodes of multiple regions: Secondary | ICD-10-CM | POA: Diagnosis not present

## 2018-01-17 DIAGNOSIS — G8929 Other chronic pain: Secondary | ICD-10-CM

## 2018-01-17 DIAGNOSIS — E8989 Other postprocedural endocrine and metabolic complications and disorders: Secondary | ICD-10-CM

## 2018-01-17 MED ORDER — OXYCODONE-ACETAMINOPHEN 10-325 MG PO TABS: 1.0000 | ORAL_TABLET | ORAL | 0 refills | Status: DC | PRN

## 2018-01-17 MED ORDER — TRIAMTERENE-HCTZ 37.5-25 MG PO CAPS: 1.0000 | ORAL_CAPSULE | Freq: Every morning | ORAL | 0 refills | Status: DC

## 2018-01-17 MED ORDER — HYDROXYZINE HCL 10 MG PO TABS: 10.0000 mg | ORAL_TABLET | Freq: Three times a day (TID) | ORAL | 0 refills | Status: DC | PRN

## 2018-01-17 MED ORDER — OXYCODONE-ACETAMINOPHEN 10-325 MG PO TABS: 2.0000 | ORAL_TABLET | ORAL | 0 refills | Status: DC | PRN

## 2018-01-18 ENCOUNTER — Telehealth: Payer: Self-pay | Admitting: Oncology

## 2018-01-18 NOTE — Telephone Encounter (Signed)
Per 3/13 no los

## 2018-01-19 ENCOUNTER — Telehealth: Payer: Self-pay | Admitting: *Deleted

## 2018-01-19 NOTE — Telephone Encounter (Signed)
This RN returned VM ( x 2 ) from pt stating " I need to you to persuade [name] to give me xanax or valium " " that medicine he gave me he said would give me energy but I need to calm down "  " I didn't pick it up but left the medicine at the pharmacy ".  " I woke up this morning in withdrawel ".  This RN discussed above with pt : 1. Prescriptions are given by discretion of the doctor -  2. Hydroxyzine is a very good medication for relaxation - and recommended that she use it for benefit.  Dainelle stated concerns regarding her current pain and medications given my other doctors " they didn't mind giving me those other pain meds "  This RN again reiterated goal with of pain medication - and MD's discretion in what medications he feels is best, as well as MD is aware of pt's pain issues with plan for pt to be seen at a pain clinic for best control.   At end of conversation - Wilbur still stated she was " not going to get that medicine but will wait to be seen at the pain clinic "

## 2018-01-22 ENCOUNTER — Telehealth: Payer: Self-pay | Admitting: *Deleted

## 2018-01-22 ENCOUNTER — Ambulatory Visit (HOSPITAL_COMMUNITY)
Admission: RE | Admit: 2018-01-22 | Discharge: 2018-01-22 | Disposition: A | Discharge status: Home / Self Care | Payer: Commercial Managed Care - PPO | Source: Ambulatory Visit | Attending: Oncology | Admitting: Oncology

## 2018-01-22 DIAGNOSIS — I119 Hypertensive heart disease without heart failure: Secondary | ICD-10-CM | POA: Insufficient documentation

## 2018-01-22 DIAGNOSIS — I429 Cardiomyopathy, unspecified: Secondary | ICD-10-CM

## 2018-01-22 DIAGNOSIS — Z170 Estrogen receptor positive status [ER+]: Secondary | ICD-10-CM | POA: Diagnosis not present

## 2018-01-22 DIAGNOSIS — Z86718 Personal history of other venous thrombosis and embolism: Secondary | ICD-10-CM | POA: Diagnosis not present

## 2018-01-22 DIAGNOSIS — I081 Rheumatic disorders of both mitral and tricuspid valves: Secondary | ICD-10-CM | POA: Diagnosis not present

## 2018-01-22 DIAGNOSIS — C50211 Malignant neoplasm of upper-inner quadrant of right female breast: Secondary | ICD-10-CM | POA: Insufficient documentation

## 2018-01-22 DIAGNOSIS — I10 Essential (primary) hypertension: Secondary | ICD-10-CM | POA: Diagnosis not present

## 2018-01-22 NOTE — Telephone Encounter (Signed)
This RN was called to the lobby per pt coming in unscheduled and requesting to speak with this RN.  Per communication Japji wanted to let [name] know that " Dr [name] was not giving as much of the percocet that Dr Truddie Coco did "  " Dr Truddie Coco would give me my medications including 200 of the percocet and then Dr [name] gave me even less of 180 "  This RN validated pt's concerns but also reiterated how each doctor treats chronic pain is the doctors discretion - not based on prior therapies.  Again validated that [name] is aware of Annell's concern and her desire to be on prior medications not prescribed by him-and we are obtaining an appointment with the pain management clinic for consult.  Juliette stated " let [name] know I don't like the methadone because it eats into your muscle and stays in your body "  This RN again informed pt above would be communicated with the doctor.  Patient left the clinic - mildly frustrating stating " sure you will- l love you ".

## 2018-01-22 NOTE — Progress Notes (Signed)
  Echocardiogram 2D Echocardiogram has been performed.  [name] G Lorena Clearman 01/22/2018, 2:36 PM

## 2018-01-23 NOTE — Progress Notes (Signed)
[name]  [phone] [phone]     ID: [name]   DOB: 12-01-1955  MR#: [id number]  HWK#:[id number]  Patient Care Team: Patient, No Pcp Per as PCP - General (General Practice) , [name], MD as Consulting Physician (Oncology) [name], MD as Consulting Physician (Radiation Oncology) Stark Klein, MD as Consulting Physician (General Surgery) [name], [name], MD as Consulting Physician (Cardiology) [name], [name] 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  She sought attention and had a mammogram which showed an obvious carcinoma in the upper inner quadrant of the right breast, approximately 2 cm.  There were some enlarged lymph nodes in the axilla and an FNA done showed those consistent with malignant cells, most likely an invasive ductal carcinoma.  At that point she was unsure of what to do and was referred by Dr. [name] for a discussion of her treatment options.  By biopsy, it was ER/PR negative and HER-2 was 3+.  DNA index was 1.42.  We reiterated that it was most important to have her disease surgically addressed at that point and had recommended lumpectomy and axillary nodes to be addressed with which she followed through and on 04-03-02, Dr. [name] performed a right partial mastectomy and a right axillary lymph node dissection.  Final pathology revealed a 2.4 cm. high grade, Grade III invasive ductal carcinoma with an adjacent .8 cm. also high grade invasive ductal carcinoma which was felt to represent an intramammary metastasis rather than a second primary.  A smaller mass was just medial to the larger  mass.  The smaller mass was associated with high grade DCIS component.  There was no definite lymphovascular invasion identified.  However, one of fourteen axillary lymph nodes did contain a 1.5 cm. metastatic deposit.  Postoperatively she did very well.  We reiterated the fact that she did need adjuvant chemotherapy, however, she refused and decided that she would pursue radiation.  She received radiation and completed that on 08-14-02. Her subsequent history is as detailed below.   INTERVAL HISTORY: [name] returns today for a follow-up of her stage IV HER-2 positive breast cancer. She is doing "okay" overall.  She continues on trastuzumab and Pertuzumab with excellent tolerance.  She also receives Abraxane, again with no side effects that she is aware of and particularly no evidence of peripheral neuropathy  Since her last visit to the office, she underwent an echocardiogram on 01/22/2018 with results showing: left ventricular ejection fraction of 50-55%.    REVIEW OF SYSTEMS: [name] reports insomnia, emesis, and diaphoresis.  She says she is "in withdrawal" since the physician who was providing her with narcotics has lost his license.  We have tried to get her into a pain clinic, but the pain management center in [place] would not make an appointment for her.  She stopped taking Methadone when her back pain resolved and she is no longer taking methadone. She denies unusual headaches, visual changes, nausea, vomiting, or dizziness. There has been no unusual cough, phlegm production, or pleurisy. This been no change in bowel or bladder habits. She denies unexplained fatigue or unexplained weight loss, bleeding, rash, or fever. A detailed review of systems  was otherwise stable.     PAST MEDICAL HISTORY: Past Medical History:  Diagnosis Date  . Breast cancer ([place])   . DVT (deep venous thrombosis) ([place]) 10/07/2011  . Hypertension   . Radiation 11/29/2005-12/29/2005   right supraclavicular  area 4147 cGy  . Radiation 06/26/02-08/14/02   right breast 5040 cGy, tumor bed boosted to 1260 cGy  . Rheumatic fever     PAST SURGICAL HISTORY: Past Surgical History:  Procedure Laterality Date  . BREAST SURGERY    . MASTECTOMY     bilateral  . PORT A CATH REVISION      FAMILY HISTORY Family History  Problem Relation Age of Onset  . Pancreatic cancer Mother   . Kidney cancer Sister 63  . Breast cancer Sister 45  . Breast cancer Other 63  The patient's father died in his 16X  from complications of alcohol abuse. The patient's mother died in her 79s from pancreatic cancer. The patient has 6 brothers, 2 sisters. Both her sisters have had breast cancer, both diagnosed after the age of 54. The patient has not had genetic testing so far.   GYNECOLOGIC HISTORY: Menarche age 62; first live birth age 62. She is GXP63. She is not sure when she stopped having periods. She never used hormone replacement.  SOCIAL HISTORY: The patient has worked in the past as a Psychologist, counselling. At home in addition to the patient are her husband [name], originally from [place], who works as a Administrator.  Also living in the home is her daughter [name] (she is studying to be a Marine scientist).  The other 2 children are [name], who has a college degree in psychology and works as a Probation officer; and [name], who lives in [place] and works as a Higher education careers adviser.Son [name] also sometimes stays in the home. In addition the patient has an aid who helps her almost daily.   ADVANCED DIRECTIVES: Not in place  HEALTH MAINTENANCE: Social History   Tobacco Use  . Smoking status: Light Tobacco Smoker  . Smokeless tobacco: Never Used  Substance Use Topics  . Alcohol use: Yes    Comment: Rarely  . Drug use: No     Colonoscopy:  Not on file  PAP:  Not on file  Bone density:  Not on file  Lipid panel:  Not on file    Allergies  Allergen Reactions  . Pork-Derived Products Other (See Comments)    Pt says she just doesn't eat  pork; has no reaction to pork products  . Tramadol Nausea Only  . Hydrocodone-Acetaminophen Itching and Rash    Current Outpatient Medications  Medication Sig Dispense Refill  . alprazolam (XANAX) 2 MG tablet Take 1 tablet (2 mg total) by mouth daily. 30 tablet 0  . Ascorbic Acid (VITAMIN C) 100 MG tablet Take 100 mg by mouth daily.      . calcium-vitamin D (OSCAL WITH D) 500-200 MG-UNIT per tablet Take 1 tablet by mouth daily.      Marland Kitchen gabapentin (NEURONTIN) 300 MG capsule Take 1 capsule (300 mg total) by mouth daily. 90 capsule 3  . hydrOXYzine (ATARAX/VISTARIL) 10 MG tablet Take 1 tablet (10 mg total) by mouth 3 (three) times daily as needed. 30 tablet 0  . methadone (DOLOPHINE) 10 MG tablet Take 2 tablets (20 mg total) by mouth every 12 (twelve) hours. Note new dosing per pt's regimen for best control 120 tablet 0  . Multiple Vitamin (MULTIVITAMIN) capsule Take 1 capsule by mouth daily.      Marland Kitchen  mupirocin nasal ointment (BACTROBAN NASAL) 2 % Place 1 application into the nose 2 (two) times daily. Use one-half of tube in each nostril twice daily for five (5) days. 10 g 0  . oxyCODONE-acetaminophen (PERCOCET) 10-325 MG tablet Take 1 tablet by mouth every 4 (four) hours as needed for pain. 120 tablet 0  . potassium chloride (MICRO-K) 10 MEQ CR capsule Take 2 capsules (20 mEq total) by mouth daily. 60 capsule 3  . prochlorperazine (COMPAZINE) 10 MG tablet Take 1 tablet by mouth every 6 (six) hours as needed.    . sertraline (ZOLOFT) 50 MG tablet Take 1 tablet (50 mg total) by mouth daily. 30 tablet 3  . triamterene-hydrochlorothiazide (DYAZIDE) 37.5-25 MG capsule Take 1 each (1 capsule total) by mouth every morning. 30 capsule 0   No current facility-administered medications for this visit.    Facility-Administered Medications Ordered in Other Visits  Medication Dose Route Frequency Provider Last Rate Last Dose  . sodium chloride 0.9 % injection 10 mL  10 mL Intravenous PRN , [name], MD    10 mL at 03/04/14 1421    OBJECTIVE: Middle-aged African-American woman who appears chronically ill  Vitals:   01/24/18 1132  BP: 112/66  Pulse: 72  Resp: 18  Temp: 98.4 F (36.9 C)  SpO2: 100%     Body mass index is 29.96 kg/m.    ECOG FS: 1 Filed Weights   01/24/18 1132  Weight: 185 lb 9.6 oz (84.2 kg)   Sclerae unicteric, pupils round and equal Oropharynx clear and moist No cervical or supraclavicular adenopathy Lungs no rales or rhonchi Heart regular rate and rhythm Abd soft, nontender, positive bowel sounds MSK the patient's right arm and back are imaged below.  The skin nodules are essentially unchanged as compared to a week ago Neuro: nonfocal, well oriented, appropriate affect Breasts: Deferred    Right Arm and Back 01/17/2018      Photo 01 /23 /2019     Photo 07/04/2017     Photo 04/06/2017    LAB RESULTS:.  Lab Results  Component Value Date   WBC 6.7 01/24/2018   NEUTROABS 4.8 01/24/2018   HGB 12.0 01/24/2018   HCT 38.2 01/24/2018   MCV 87.4 01/24/2018   PLT 189 01/24/2018      Chemistry      Component Value Date/Time   NA 142 12/27/2017 1130   NA 142 11/09/2017 1214   K 3.8 12/27/2017 1130   K 3.9 11/09/2017 1214   CL 100 12/27/2017 1130   CL 101 03/07/2013 1411   CO2 32 (H) 12/27/2017 1130   CO2 32 (H) 11/09/2017 1214   BUN 11 12/27/2017 1130   BUN 19.1 11/09/2017 1214   CREATININE 0.94 12/27/2017 1130   CREATININE 1.0 11/09/2017 1214      Component Value Date/Time   CALCIUM 9.1 12/27/2017 1130   CALCIUM 9.1 11/09/2017 1214   ALKPHOS 122 12/27/2017 1130   ALKPHOS 120 11/09/2017 1214   AST 21 12/27/2017 1130   AST 30 11/09/2017 1214   ALT 9 12/27/2017 1130   ALT 19 11/09/2017 1214   BILITOT 0.2 12/27/2017 1130   BILITOT <0.22 11/09/2017 1214       STUDIES: Since her last visit to the office, she underwent an echocardiogram on 01/22/2018 with results showing: left ventricular ejection fraction of 50-55%.     ASSESSMENT:  62 y.o.  woman with stage IV breast cancer manifested chiefly by loco-regional nodal disease (neck, chest) and skin  involvement, without liver, bone, or brain metastases documented   (1) Status post right upper inner quadrant lumpectomy and sentinel lymph node sampling 03/04/2002 for 2 separate foci of invasive ductal carcinoma, mpT2 pN1 or stage IIB, both foci grade 3, both estrogen and progesterone receptor positive, both HercepTest 3+, Mib-1 56%  (2) Reexcision for margins 05/27/2002 showed no residual cancer in the breast.  (3) The patient refused adjuvant systemic therapy.  (4) Adjuvant radiation treatment completed 08/14/2002.  (5) recurrence in the right breast in 02/2004 showing a morphologically different tumor, again grade 3, again estrogen and progesterone receptor negative, with an MIB-1 of 14% and Herceptest 3+.  (5) Between 03/2004 and 07/2004 she received dose dense Doxorubicin/Cyclophosphamide x 4 given with trastuzumab, followed by weekly Docetaxel x 8, again given with trastuzumab.  (6) Right mastectomy 07/13/2004 showed scattered microscopic foci of residual disease over an area  greater than 5 cm. Margins were negative.  (7) Postoperative Docetaxel continued until 09/2004.  (8) Trastuzumab (Herceptin) given 08/2004 through 01/2012 with some brief interruptions.  (9) Isolated right cervical nodal recurrence 10/2005, treated with radiation to the right supraclavicular area (total 41.5 gray) completed 12/29/2005.  (10) Navelbine given together with Herceptin  11/2005 through 03/2006.  (9) Left mastectomy 02/13/2006 for ductal carcinoma in situ, grade 2, estrogen and progesterone receptor negative, with negative margins; 0 of 3 lymph nodes involved  (10) treated with Lapatinib and Capecitabine before 10/2009, for an unclear duration and with unclear results (cannot locate data on chart review).  (11) Status post right supraclavicular lymph node  biopsy 09/2010 again positive for an invasive ductal carcinoma, estrogen and progesterone receptor negative, HER-2 positive by [facility] with a ratio 4.25.  (12) Navelbine given together with Herceptin between 05/2011 and 11/2011.  (13) Carboplatin/ Gemcitabine/ Herceptin given for 2 cycles, in 12/2011 and 01/2012.  (14) TDM-1 ([place]) started 02/2012. Last dose 10/02/2013 after which the patient discontinued treatment at her own discretion. Echo on December 2014 showed a well preserved ejection fraction.  (15) Deep vein thrombosis of the right upper extremity documented 04/20/2011.  She completed anticoagulant therapy with Coumadin on 03/25/2013.  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 Left chest Port-A-Cath placed on 04/08/2013; being flushed every 6 weeks  RIGHT UPPER EXTREMITY ANGIOSARCOMA VS BREAST CANCER: August 2016 (18) treated at [facility] of [place] August 2016 with paclitaxel, trastuzumab and pertuzumab 1 cycle, afterwards referred to hospice  (19) under the care of hospice of [facility] August 2016 to 05/08/2016, when the patient opted for a second try at chemotherapy  (20) started low-dose Abraxane, trastuzumab and pertuzumab 06/07/2016, to be repeated every 3 weeks.   (a) pretreatment echocardiogram 06/06/2016 showed a 60-65% ejection fraction  (b) significant compliance problems compromise response to treatment  (c)  echocardiogram 02/20/17 LVEF 55-60%  (d) Abraxane discontinued 07/04/2017 because of possible neuropathy symptoms  (e) Abraxane resumed 09/27/2017  (f) echocardiogram 07/20/2017 showed an ejection fraction of 60-65%  (g) echo 01/22/2018 showed an ejection fraction of 50-55%  (21) Chronic pain secondary to known  metastatic disease  (a) patient signed pain contract 10/19/2016  (b) as of 11/16/2016 receives her pain medications from Dr [name]  (c)  Dr [name] no longer able to prescribe as of February 2019   PLAN: Inge is now 12 years  out from diagnosis of metastatic breast cancer.  She has obvious disease on her back and right arm.  She also has a right upper extremity angiosarcoma.  These diseases are generally stable on her current treatment which is trastuzumab and Pertuzumab together with Abraxane given every 4 weeks  Daphene has been receiving narcotics through a physician who recently lost his license and she says she is "in withdrawal.  That is not clinically apparent today.  I have prescribed methadone for her.  She says she does not want to take it because it is bad for the bones.  She says methadone needs to be taken all at once or not several times a day.  I explained that methadone does not affect the bones, that it is a kind of narcotic, and that it should be taken 3 times a day but if she wishes to start twice a day we can start that way.  She would like to have her Percocet refilled at 180 tablets each month.  I told her I do not know how much Percocet she will need if she takes the methadone.  Possibly she would need no Percocet at all.  Accordingly we did not increase that from the 120 that she received 2 weeks ago.  I do not know why we were not able to get [name] into a pain clinic but we will continue to try to do that since she is not satisfied with the pain program but I am providing for her  From a breast cancer point of view and a sarcoma point of view we are continuing with Abraxane and anti-HER-2 treatment every 28 days.  I spent approximately 40 minutes with [name] today going over her pain situation. She knows to call for any other issues that may develop before the next visit.   Crystalyn Delia, [name], MD  01/24/18 11:43 AM Medical Oncology and Hematology [facility] [address] [place], [place] [address] Tel. [phone]    Fax. [phone]  This document serves as a record of services personally performed by Sullivan Lone, MD. It was created on his behalf by Margit Banda, a trained medical scribe. The creation of this record is based on the scribe's personal observations and the provider's statements to them.   I have reviewed the above documentation for accuracy and completeness, and I agree with the above.

## 2018-01-24 ENCOUNTER — Inpatient Hospital Stay: Payer: Commercial Managed Care - PPO

## 2018-01-24 ENCOUNTER — Inpatient Hospital Stay (HOSPITAL_BASED_OUTPATIENT_CLINIC_OR_DEPARTMENT_OTHER): Payer: Commercial Managed Care - PPO | Admitting: Oncology

## 2018-01-24 ENCOUNTER — Telehealth: Payer: Self-pay | Admitting: *Deleted

## 2018-01-24 ENCOUNTER — Telehealth: Payer: Self-pay | Admitting: General Practice

## 2018-01-24 ENCOUNTER — Ambulatory Visit: Payer: Self-pay

## 2018-01-24 VITALS — BP 112/66 | HR 72 | Temp 98.4°F | Resp 18 | Ht 66.0 in | Wt 185.6 lb

## 2018-01-24 DIAGNOSIS — Z170 Estrogen receptor positive status [ER+]: Secondary | ICD-10-CM

## 2018-01-24 DIAGNOSIS — C792 Secondary malignant neoplasm of skin: Secondary | ICD-10-CM

## 2018-01-24 DIAGNOSIS — C4911 Malignant neoplasm of connective and soft tissue of right upper limb, including shoulder: Secondary | ICD-10-CM

## 2018-01-24 DIAGNOSIS — G893 Neoplasm related pain (acute) (chronic): Secondary | ICD-10-CM | POA: Diagnosis not present

## 2018-01-24 DIAGNOSIS — C50211 Malignant neoplasm of upper-inner quadrant of right female breast: Secondary | ICD-10-CM

## 2018-01-24 DIAGNOSIS — Z95828 Presence of other vascular implants and grafts: Secondary | ICD-10-CM

## 2018-01-24 DIAGNOSIS — Z5112 Encounter for antineoplastic immunotherapy: Secondary | ICD-10-CM | POA: Diagnosis not present

## 2018-01-24 LAB — COMPREHENSIVE METABOLIC PANEL
ALT: 10 U/L (ref 0–55)
AST: 19 U/L (ref 5–34)
Albumin: 3.4 g/dL — ABNORMAL LOW (ref 3.5–5.0)
Alkaline Phosphatase: 115 U/L (ref 40–150)
Anion gap: 8 (ref 3–11)
BUN: 9 mg/dL (ref 7–26)
CO2: 31 mmol/L — ABNORMAL HIGH (ref 22–29)
Calcium: 9.4 mg/dL (ref 8.4–10.4)
Chloride: 101 mmol/L (ref 98–109)
Creatinine, Ser: 0.86 mg/dL (ref 0.60–1.10)
GFR calc Af Amer: >60 mL/min (ref >60)
GFR calc non Af Amer: >60 mL/min (ref >60)
Glucose, Bld: 100 mg/dL (ref 70–140)
Potassium: 3.7 mmol/L (ref 3.5–5.1)
Sodium: 140 mmol/L (ref 136–145)
Total Bilirubin: 0.3 mg/dL (ref 0.2–1.2)
Total Protein: 7.5 g/dL (ref 6.4–8.3)

## 2018-01-24 LAB — CBC WITH DIFFERENTIAL/PLATELET
Basophils Absolute: 0 10*3/uL (ref 0.0–0.1)
Basophils Relative: 0 %
Eosinophils Absolute: 0.2 10*3/uL (ref 0.0–0.5)
Eosinophils Relative: 2 %
HCT: 38.2 % (ref 34.8–46.6)
Hemoglobin: 12 g/dL (ref 11.6–15.9)
Lymphocytes Relative: 19 %
Lymphs Abs: 1.3 10*3/uL (ref 0.9–3.3)
MCH: 27.5 pg (ref 25.1–34.0)
MCHC: 31.4 g/dL — ABNORMAL LOW (ref 31.5–36.0)
MCV: 87.4 fL (ref 79.5–101.0)
Monocytes Absolute: 0.5 10*3/uL (ref 0.1–0.9)
Monocytes Relative: 7 %
Neutro Abs: 4.8 10*3/uL (ref 1.5–6.5)
Neutrophils Relative %: 72 %
Platelets: 189 10*3/uL (ref 145–400)
RBC: 4.37 MIL/uL (ref 3.70–5.45)
RDW: 15.4 % — ABNORMAL HIGH (ref 11.2–14.5)
WBC: 6.7 10*3/uL (ref 3.9–10.3)

## 2018-01-24 MED ORDER — SODIUM CHLORIDE 0.9 % IV SOLN
Freq: Once | INTRAVENOUS | Status: AC
  Administered 2018-01-24: 12:00:00 via INTRAVENOUS

## 2018-01-24 MED ORDER — SODIUM CHLORIDE 0.9 % IJ SOLN
10.0000 mL | INTRAMUSCULAR | Status: DC | PRN
  Administered 2018-01-24: 10 mL via INTRAVENOUS
  Filled 2018-01-24: qty 10

## 2018-01-24 MED ORDER — PROCHLORPERAZINE MALEATE 10 MG PO TABS
ORAL_TABLET | ORAL | Status: AC
  Filled 2018-01-24: qty 1

## 2018-01-24 MED ORDER — PROCHLORPERAZINE MALEATE 10 MG PO TABS
10.0000 mg | ORAL_TABLET | Freq: Once | ORAL | Status: AC
  Administered 2018-01-24: 10 mg via ORAL

## 2018-01-24 MED ORDER — ACETAMINOPHEN 325 MG PO TABS
650.0000 mg | ORAL_TABLET | Freq: Once | ORAL | Status: AC
  Administered 2018-01-24: 650 mg via ORAL

## 2018-01-24 MED ORDER — TRASTUZUMAB CHEMO 150 MG IV SOLR
6.0000 mg/kg | Freq: Once | INTRAVENOUS | Status: AC
  Administered 2018-01-24: 525 mg via INTRAVENOUS
  Filled 2018-01-24: qty 25

## 2018-01-24 MED ORDER — METHADONE HCL 10 MG PO TABS: 10.0000 mg | ORAL_TABLET | Freq: Two times a day (BID) | ORAL | 0 refills | Status: DC

## 2018-01-24 MED ORDER — ACETAMINOPHEN 325 MG PO TABS
ORAL_TABLET | ORAL | Status: AC
  Filled 2018-01-24: qty 2

## 2018-01-24 MED ORDER — HEPARIN SOD (PORK) LOCK FLUSH 100 UNIT/ML IV SOLN
500.0000 [IU] | Freq: Once | INTRAVENOUS | Status: AC | PRN
  Administered 2018-01-24: 500 [IU]
  Filled 2018-01-24: qty 5

## 2018-01-24 MED ORDER — SODIUM CHLORIDE 0.9 % IV SOLN: Freq: Once | INTRAVENOUS | Status: DC

## 2018-01-24 MED ORDER — DIPHENHYDRAMINE HCL 25 MG PO CAPS
25.0000 mg | ORAL_CAPSULE | Freq: Once | ORAL | Status: AC
  Administered 2018-01-24: 25 mg via ORAL

## 2018-01-24 MED ORDER — SODIUM CHLORIDE 0.9 % IV SOLN
420.0000 mg | Freq: Once | INTRAVENOUS | Status: AC
  Administered 2018-01-24: 420 mg via INTRAVENOUS
  Filled 2018-01-24: qty 14

## 2018-01-24 MED ORDER — DIPHENHYDRAMINE HCL 25 MG PO CAPS
ORAL_CAPSULE | ORAL | Status: AC
  Filled 2018-01-24: qty 1

## 2018-01-24 MED ORDER — PACLITAXEL PROTEIN-BOUND CHEMO INJECTION 100 MG
100.0000 mg/m2 | Freq: Once | INTRAVENOUS | Status: AC
  Administered 2018-01-24: 200 mg via INTRAVENOUS
  Filled 2018-01-24: qty 40

## 2018-01-24 MED ORDER — SODIUM CHLORIDE 0.9% FLUSH
10.0000 mL | INTRAVENOUS | Status: DC | PRN
  Administered 2018-01-24: 10 mL
  Filled 2018-01-24: qty 10

## 2018-01-24 NOTE — Patient Instructions (Signed)
[facility] Discharge Instructions for Patients Receiving Chemotherapy  Today you received the following chemotherapy agents:  Herceptin, Perjeta, Abraxane  To help prevent nausea and vomiting after your treatment, we encourage you to take your nausea medication as prescribed.   If you develop nausea and vomiting that is not controlled by your nausea medication, call the clinic.   BELOW ARE SYMPTOMS THAT SHOULD BE REPORTED IMMEDIATELY:  *FEVER GREATER THAN 100.5 F  *CHILLS WITH OR WITHOUT FEVER  NAUSEA AND VOMITING THAT IS NOT CONTROLLED WITH YOUR NAUSEA MEDICATION  *UNUSUAL SHORTNESS OF BREATH  *UNUSUAL BRUISING OR BLEEDING  TENDERNESS IN MOUTH AND THROAT WITH OR WITHOUT PRESENCE OF ULCERS  *URINARY PROBLEMS  *BOWEL PROBLEMS  UNUSUAL RASH Items with * indicate a potential emergency and should be followed up as soon as possible.  Feel free to call the clinic should you have any questions or concerns. The clinic phone number is [phone].  Please show the [facility] at check-in to the Emergency Department and triage nurse.

## 2018-01-24 NOTE — Telephone Encounter (Signed)
Patient called back to discuss her symptoms she called in about. She says "I am in the oncologist office right now, so I will have to call you back." I asked will this be taken care of with the oncologist, she said "no, I will call you back." See below for her call history.  Message from Tye Maryland sent at 01/24/2018 11:26 AM EDT   Pt called and states she is going thru withdrawal from her medications, she is a cancer pt; pt is throwing up, weak, feeling light headed, contact pt to advise

## 2018-01-24 NOTE — Telephone Encounter (Signed)
Pt. Called to discuss needing medications ordered.  Reported she has "terminal cancer.  Stated she was on Xanax, Oxymorphone, and Percocet by a physician in [place], [place].  Stated "it was a good balance of medication for me.  I would like to get those medications filled."   Advised pt. That I could see where Percocet has been ordered by Dr. Jana Hakim on 01/17/18.  Pt. stated that Dr. Jana Hakim only ordered 120 tabs, and should have ordered 180 tabs.  Advised she will need to discuss that with Dr. Jana Hakim.  Also, advised that she will need to get established with PCP next week on 3/26, before any medication can even be considered.  Reminded of her appt. Time/ date.  Pt. verb. Understanding of the above.

## 2018-01-24 NOTE — Addendum Note (Signed)
Addended by: Chauncey Cruel on: 01/24/2018 12:19 PM   Modules accepted: Orders

## 2018-01-24 NOTE — Telephone Encounter (Signed)
This RN received call from treatment room nurse stating pt was unaware she would not be receiving the Perjeta today - pt is requesting to receive this medication.  This RN inquired with nurse who is with the patient of concern regarding diarrhea discussed at visit last week - with [name] responding " I never had diarrhea ".  Informed MD of above for further recommendations.

## 2018-01-30 ENCOUNTER — Ambulatory Visit: Payer: Commercial Managed Care - PPO | Admitting: Family Medicine

## 2018-01-30 DIAGNOSIS — Z0289 Encounter for other administrative examinations: Secondary | ICD-10-CM

## 2018-01-31 ENCOUNTER — Ambulatory Visit: Payer: Self-pay | Admitting: Nurse Practitioner

## 2018-01-31 ENCOUNTER — Ambulatory Visit: Payer: Commercial Managed Care - PPO | Admitting: Family Medicine

## 2018-02-04 DIAGNOSIS — R031 Nonspecific low blood-pressure reading: Secondary | ICD-10-CM | POA: Diagnosis not present

## 2018-02-04 DIAGNOSIS — R531 Weakness: Secondary | ICD-10-CM | POA: Diagnosis not present

## 2018-02-05 ENCOUNTER — Inpatient Hospital Stay: Payer: Commercial Managed Care - PPO

## 2018-02-05 ENCOUNTER — Ambulatory Visit (HOSPITAL_COMMUNITY)
Admission: RE | Admit: 2018-02-05 | Discharge: 2018-02-05 | Disposition: A | Discharge status: Home / Self Care | Payer: Commercial Managed Care - PPO | Source: Ambulatory Visit | Attending: Medical | Admitting: Medical

## 2018-02-05 ENCOUNTER — Other Ambulatory Visit: Payer: Self-pay

## 2018-02-05 ENCOUNTER — Inpatient Hospital Stay (HOSPITAL_BASED_OUTPATIENT_CLINIC_OR_DEPARTMENT_OTHER): Payer: Commercial Managed Care - PPO | Admitting: Medical

## 2018-02-05 ENCOUNTER — Telehealth: Payer: Self-pay

## 2018-02-05 ENCOUNTER — Inpatient Hospital Stay: Payer: Commercial Managed Care - PPO | Attending: Oncology | Admitting: Medical

## 2018-02-05 VITALS — BP 120/79 | HR 101 | Temp 101.0°F | Resp 21 | Wt 188.9 lb

## 2018-02-05 DIAGNOSIS — R109 Unspecified abdominal pain: Secondary | ICD-10-CM | POA: Diagnosis not present

## 2018-02-05 DIAGNOSIS — C50211 Malignant neoplasm of upper-inner quadrant of right female breast: Secondary | ICD-10-CM | POA: Diagnosis not present

## 2018-02-05 DIAGNOSIS — D72823 Leukemoid reaction: Secondary | ICD-10-CM

## 2018-02-05 DIAGNOSIS — C496 Malignant neoplasm of connective and soft tissue of trunk, unspecified: Secondary | ICD-10-CM | POA: Diagnosis not present

## 2018-02-05 DIAGNOSIS — R61 Generalized hyperhidrosis: Secondary | ICD-10-CM

## 2018-02-05 DIAGNOSIS — R509 Fever, unspecified: Secondary | ICD-10-CM | POA: Insufficient documentation

## 2018-02-05 DIAGNOSIS — K5900 Constipation, unspecified: Secondary | ICD-10-CM

## 2018-02-05 DIAGNOSIS — Z170 Estrogen receptor positive status [ER+]: Secondary | ICD-10-CM

## 2018-02-05 DIAGNOSIS — R000 Tachycardia, unspecified: Secondary | ICD-10-CM

## 2018-02-05 DIAGNOSIS — R197 Diarrhea, unspecified: Secondary | ICD-10-CM

## 2018-02-05 DIAGNOSIS — C4911 Malignant neoplasm of connective and soft tissue of right upper limb, including shoulder: Secondary | ICD-10-CM | POA: Diagnosis not present

## 2018-02-05 DIAGNOSIS — Z95828 Presence of other vascular implants and grafts: Secondary | ICD-10-CM

## 2018-02-05 DIAGNOSIS — R112 Nausea with vomiting, unspecified: Secondary | ICD-10-CM | POA: Diagnosis not present

## 2018-02-05 DIAGNOSIS — I10 Essential (primary) hypertension: Secondary | ICD-10-CM | POA: Diagnosis not present

## 2018-02-05 DIAGNOSIS — R05 Cough: Secondary | ICD-10-CM | POA: Diagnosis not present

## 2018-02-05 DIAGNOSIS — N179 Acute kidney failure, unspecified: Secondary | ICD-10-CM | POA: Diagnosis not present

## 2018-02-05 DIAGNOSIS — G8929 Other chronic pain: Secondary | ICD-10-CM | POA: Insufficient documentation

## 2018-02-05 DIAGNOSIS — C50411 Malignant neoplasm of upper-outer quadrant of right female breast: Secondary | ICD-10-CM | POA: Insufficient documentation

## 2018-02-05 DIAGNOSIS — R918 Other nonspecific abnormal finding of lung field: Secondary | ICD-10-CM | POA: Insufficient documentation

## 2018-02-05 DIAGNOSIS — A4151 Sepsis due to Escherichia coli [E. coli]: Secondary | ICD-10-CM | POA: Diagnosis not present

## 2018-02-05 DIAGNOSIS — N390 Urinary tract infection, site not specified: Secondary | ICD-10-CM | POA: Diagnosis not present

## 2018-02-05 DIAGNOSIS — G9341 Metabolic encephalopathy: Secondary | ICD-10-CM | POA: Diagnosis not present

## 2018-02-05 DIAGNOSIS — R4182 Altered mental status, unspecified: Secondary | ICD-10-CM | POA: Diagnosis not present

## 2018-02-05 DIAGNOSIS — R400 Somnolence: Secondary | ICD-10-CM | POA: Diagnosis not present

## 2018-02-05 LAB — CBC WITH DIFFERENTIAL (CANCER CENTER ONLY)
Basophils Absolute: 0.1 10*3/uL (ref 0.0–0.1)
Basophils Relative: 1 %
Eosinophils Absolute: 0 10*3/uL (ref 0.0–0.5)
Eosinophils Relative: 0 %
HCT: 32.4 % — ABNORMAL LOW (ref 34.8–46.6)
Hemoglobin: 10.7 g/dL — ABNORMAL LOW (ref 11.6–15.9)
Lymphocytes Relative: 6 %
Lymphs Abs: 0.8 10*3/uL — ABNORMAL LOW (ref 0.9–3.3)
MCH: 27.4 pg (ref 25.1–34.0)
MCHC: 32.9 g/dL (ref 31.5–36.0)
MCV: 83.3 fL (ref 79.5–101.0)
Monocytes Absolute: 1.4 10*3/uL — ABNORMAL HIGH (ref 0.1–0.9)
Monocytes Relative: 10 %
Neutro Abs: 11.2 10*3/uL — ABNORMAL HIGH (ref 1.5–6.5)
Neutrophils Relative %: 83 %
Platelet Count: 258 10*3/uL (ref 145–400)
RBC: 3.89 MIL/uL (ref 3.70–5.45)
RDW: 15.8 % — ABNORMAL HIGH (ref 11.2–14.5)
WBC Count: 13.5 10*3/uL — ABNORMAL HIGH (ref 3.9–10.3)

## 2018-02-05 LAB — CMP (CANCER CENTER ONLY)
ALT: 22 U/L (ref 0–55)
AST: 37 U/L — ABNORMAL HIGH (ref 5–34)
Albumin: 2.9 g/dL — ABNORMAL LOW (ref 3.5–5.0)
Alkaline Phosphatase: 118 U/L (ref 40–150)
Anion gap: 9 (ref 3–11)
BUN: 17 mg/dL (ref 7–26)
CO2: 29 mmol/L (ref 22–29)
Calcium: 8.8 mg/dL (ref 8.4–10.4)
Chloride: 100 mmol/L (ref 98–109)
Creatinine: 1.1 mg/dL (ref 0.60–1.10)
GFR, Est AFR Am: >60 mL/min (ref >60)
GFR, Estimated: 53 mL/min — ABNORMAL LOW (ref >60)
Glucose, Bld: 99 mg/dL (ref 70–140)
Potassium: 3.1 mmol/L — ABNORMAL LOW (ref 3.5–5.1)
Sodium: 138 mmol/L (ref 136–145)
Total Bilirubin: 0.4 mg/dL (ref 0.2–1.2)
Total Protein: 7.3 g/dL (ref 6.4–8.3)

## 2018-02-05 LAB — URINALYSIS, COMPLETE (UACMP) WITH MICROSCOPIC
Bacteria, UA: NONE SEEN
Bilirubin Urine: NEGATIVE
Glucose, UA: NEGATIVE mg/dL
Hgb urine dipstick: NEGATIVE
Ketones, ur: NEGATIVE mg/dL
Leukocytes, UA: NEGATIVE
Nitrite: NEGATIVE
Protein, ur: NEGATIVE mg/dL
RBC / HPF: NONE SEEN RBC/hpf (ref 0–5)
Specific Gravity, Urine: 1 — ABNORMAL LOW (ref 1.005–1.030)
Squamous Epithelial / LPF: NONE SEEN
pH: 6 (ref 5.0–8.0)

## 2018-02-05 MED ORDER — DOXYCYCLINE HYCLATE 100 MG PO TABS: 100.0000 mg | ORAL_TABLET | Freq: Two times a day (BID) | ORAL | 0 refills | Status: DC

## 2018-02-05 MED ORDER — HEPARIN SOD (PORK) LOCK FLUSH 100 UNIT/ML IV SOLN
500.0000 [IU] | Freq: Once | INTRAVENOUS | Status: AC | PRN
  Administered 2018-02-05: 500 [IU] via INTRAVENOUS
  Filled 2018-02-05: qty 5

## 2018-02-05 MED ORDER — SODIUM CHLORIDE 0.9 % IJ SOLN
10.0000 mL | INTRAMUSCULAR | Status: DC | PRN
  Administered 2018-02-05: 10 mL via INTRAVENOUS
  Filled 2018-02-05: qty 10

## 2018-02-05 NOTE — Telephone Encounter (Signed)
[facility] desk came and got me and said pt is out in lobby requesting to be seen today.  Went out to see pt and she reported she would like to be seen for abd pain, questions regarding Methadone, and said the ambulance wanted to her to be seen about her blood pressure but she was afraid to go.  Pt agreeable to be seen in symptom management, Called Sandi Mealy PA and he gave me orders for CBC and CMET. Lab appt and appt in symptom management - notified schedule and pt marked as arrived. Noted steady gait as pt walked over to lab and got her tubes for port draw. Seated in lobby, waiting.  Notified Audiological scientist in symptom management as well.

## 2018-02-05 NOTE — Progress Notes (Signed)
Pt reports sore throat and pain in abdomen for last few days.  States that she can't remember last time she had a bowel movement.  Took pt to restroom for urine sample.  Did not hear pt voiding, only sink running for a few seconds after flushing.  Pt handed me her urine cup.  Contents were completely clear and cold to touch. Pt refused second set of blood cultures drawn through the arm, only one from her port.  PA [name] aware.

## 2018-02-05 NOTE — Progress Notes (Signed)
These results were called to [name]. No answer. Left message for the patient.  Sandi Mealy, PA-C

## 2018-02-05 NOTE — Progress Notes (Signed)
These results were called to [name]. No answer. Left message. Told the patient that I feared that her specimen was contaminated with water. Sandi Mealy, PA-C

## 2018-02-05 NOTE — Progress Notes (Signed)
These results were called to [name]. No answer. Left message with the patient's potassium level. Told her to take potassium which is on her medication list.  Sandi Mealy, PA-C

## 2018-02-06 ENCOUNTER — Emergency Department (HOSPITAL_COMMUNITY): Payer: Commercial Managed Care - PPO

## 2018-02-06 ENCOUNTER — Other Ambulatory Visit: Payer: Self-pay

## 2018-02-06 ENCOUNTER — Other Ambulatory Visit: Payer: Self-pay | Admitting: Medical

## 2018-02-06 ENCOUNTER — Inpatient Hospital Stay (HOSPITAL_COMMUNITY)
Admission: EM | Admit: 2018-02-06 | Discharge: 2018-02-08 | DRG: 871 | Disposition: A | Discharge status: Home / Self Care | Payer: Commercial Managed Care - PPO | Attending: Internal Medicine | Admitting: Internal Medicine

## 2018-02-06 ENCOUNTER — Telehealth: Payer: Self-pay | Admitting: Emergency Medicine

## 2018-02-06 ENCOUNTER — Other Ambulatory Visit: Payer: Self-pay | Admitting: Oncology

## 2018-02-06 ENCOUNTER — Encounter (HOSPITAL_COMMUNITY): Payer: Self-pay | Admitting: Emergency Medicine

## 2018-02-06 DIAGNOSIS — E876 Hypokalemia: Secondary | ICD-10-CM | POA: Diagnosis present

## 2018-02-06 DIAGNOSIS — R05 Cough: Secondary | ICD-10-CM | POA: Diagnosis present

## 2018-02-06 DIAGNOSIS — N179 Acute kidney failure, unspecified: Secondary | ICD-10-CM | POA: Diagnosis present

## 2018-02-06 DIAGNOSIS — I10 Essential (primary) hypertension: Secondary | ICD-10-CM | POA: Diagnosis not present

## 2018-02-06 DIAGNOSIS — R339 Retention of urine, unspecified: Secondary | ICD-10-CM | POA: Diagnosis not present

## 2018-02-06 DIAGNOSIS — F14929 Cocaine use, unspecified with intoxication, unspecified: Secondary | ICD-10-CM | POA: Diagnosis not present

## 2018-02-06 DIAGNOSIS — Z79891 Long term (current) use of opiate analgesic: Secondary | ICD-10-CM

## 2018-02-06 DIAGNOSIS — F17200 Nicotine dependence, unspecified, uncomplicated: Secondary | ICD-10-CM | POA: Diagnosis present

## 2018-02-06 DIAGNOSIS — G8929 Other chronic pain: Secondary | ICD-10-CM | POA: Diagnosis not present

## 2018-02-06 DIAGNOSIS — C778 Secondary and unspecified malignant neoplasm of lymph nodes of multiple regions: Secondary | ICD-10-CM | POA: Diagnosis not present

## 2018-02-06 DIAGNOSIS — Z170 Estrogen receptor positive status [ER+]: Secondary | ICD-10-CM

## 2018-02-06 DIAGNOSIS — F419 Anxiety disorder, unspecified: Secondary | ICD-10-CM | POA: Diagnosis present

## 2018-02-06 DIAGNOSIS — C50211 Malignant neoplasm of upper-inner quadrant of right female breast: Secondary | ICD-10-CM

## 2018-02-06 DIAGNOSIS — N390 Urinary tract infection, site not specified: Secondary | ICD-10-CM | POA: Diagnosis present

## 2018-02-06 DIAGNOSIS — I890 Lymphedema, not elsewhere classified: Secondary | ICD-10-CM | POA: Diagnosis present

## 2018-02-06 DIAGNOSIS — R918 Other nonspecific abnormal finding of lung field: Secondary | ICD-10-CM | POA: Diagnosis present

## 2018-02-06 DIAGNOSIS — R4182 Altered mental status, unspecified: Secondary | ICD-10-CM | POA: Diagnosis present

## 2018-02-06 DIAGNOSIS — C4911 Malignant neoplasm of connective and soft tissue of right upper limb, including shoulder: Secondary | ICD-10-CM | POA: Diagnosis present

## 2018-02-06 DIAGNOSIS — R7881 Bacteremia: Secondary | ICD-10-CM

## 2018-02-06 DIAGNOSIS — Z853 Personal history of malignant neoplasm of breast: Secondary | ICD-10-CM

## 2018-02-06 DIAGNOSIS — K5900 Constipation, unspecified: Secondary | ICD-10-CM | POA: Diagnosis present

## 2018-02-06 DIAGNOSIS — C496 Malignant neoplasm of connective and soft tissue of trunk, unspecified: Secondary | ICD-10-CM | POA: Diagnosis present

## 2018-02-06 DIAGNOSIS — Z79899 Other long term (current) drug therapy: Secondary | ICD-10-CM

## 2018-02-06 DIAGNOSIS — R400 Somnolence: Secondary | ICD-10-CM | POA: Diagnosis not present

## 2018-02-06 DIAGNOSIS — R109 Unspecified abdominal pain: Secondary | ICD-10-CM | POA: Diagnosis not present

## 2018-02-06 DIAGNOSIS — A4151 Sepsis due to Escherichia coli [E. coli]: Principal | ICD-10-CM | POA: Diagnosis present

## 2018-02-06 DIAGNOSIS — G894 Chronic pain syndrome: Secondary | ICD-10-CM | POA: Diagnosis present

## 2018-02-06 DIAGNOSIS — Z9013 Acquired absence of bilateral breasts and nipples: Secondary | ICD-10-CM

## 2018-02-06 DIAGNOSIS — C50919 Malignant neoplasm of unspecified site of unspecified female breast: Secondary | ICD-10-CM

## 2018-02-06 DIAGNOSIS — R509 Fever, unspecified: Secondary | ICD-10-CM

## 2018-02-06 DIAGNOSIS — D72823 Leukemoid reaction: Secondary | ICD-10-CM | POA: Diagnosis present

## 2018-02-06 DIAGNOSIS — Z87440 Personal history of urinary (tract) infections: Secondary | ICD-10-CM

## 2018-02-06 DIAGNOSIS — G9341 Metabolic encephalopathy: Secondary | ICD-10-CM | POA: Diagnosis present

## 2018-02-06 DIAGNOSIS — Z86718 Personal history of other venous thrombosis and embolism: Secondary | ICD-10-CM

## 2018-02-06 HISTORY — DX: Fever, unspecified: R50.9

## 2018-02-06 LAB — BLOOD CULTURE ID PANEL (REFLEXED)
Acinetobacter baumannii: NOT DETECTED
Acinetobacter baumannii: NOT DETECTED
Candida albicans: NOT DETECTED
Candida albicans: NOT DETECTED
Candida glabrata: NOT DETECTED
Candida glabrata: NOT DETECTED
Candida krusei: NOT DETECTED
Candida krusei: NOT DETECTED
Candida parapsilosis: NOT DETECTED
Candida parapsilosis: NOT DETECTED
Candida tropicalis: NOT DETECTED
Candida tropicalis: NOT DETECTED
Carbapenem resistance: NOT DETECTED
Carbapenem resistance: NOT DETECTED
Enterobacter cloacae complex: NOT DETECTED
Enterobacter cloacae complex: NOT DETECTED
Enterobacteriaceae species: DETECTED — AB
Enterobacteriaceae species: DETECTED — AB
Enterococcus species: NOT DETECTED
Enterococcus species: NOT DETECTED
Escherichia coli: DETECTED — AB
Escherichia coli: DETECTED — AB
Haemophilus influenzae: NOT DETECTED
Haemophilus influenzae: NOT DETECTED
Klebsiella oxytoca: NOT DETECTED
Klebsiella oxytoca: NOT DETECTED
Klebsiella pneumoniae: NOT DETECTED
Klebsiella pneumoniae: NOT DETECTED
Listeria monocytogenes: NOT DETECTED
Listeria monocytogenes: NOT DETECTED
Neisseria meningitidis: NOT DETECTED
Neisseria meningitidis: NOT DETECTED
Proteus species: NOT DETECTED
Proteus species: NOT DETECTED
Pseudomonas aeruginosa: NOT DETECTED
Pseudomonas aeruginosa: NOT DETECTED
Serratia marcescens: NOT DETECTED
Serratia marcescens: NOT DETECTED
Staphylococcus aureus (BCID): NOT DETECTED
Staphylococcus aureus (BCID): NOT DETECTED
Staphylococcus species: NOT DETECTED
Staphylococcus species: NOT DETECTED
Streptococcus agalactiae: NOT DETECTED
Streptococcus agalactiae: NOT DETECTED
Streptococcus pneumoniae: NOT DETECTED
Streptococcus pneumoniae: NOT DETECTED
Streptococcus pyogenes: NOT DETECTED
Streptococcus pyogenes: NOT DETECTED
Streptococcus species: NOT DETECTED
Streptococcus species: NOT DETECTED

## 2018-02-06 LAB — URINALYSIS, ROUTINE W REFLEX MICROSCOPIC
Bilirubin Urine: NEGATIVE
Glucose, UA: NEGATIVE mg/dL
Ketones, ur: NEGATIVE mg/dL
Nitrite: NEGATIVE
Protein, ur: 100 mg/dL — AB
Specific Gravity, Urine: 1.012 (ref 1.005–1.030)
pH: 5 (ref 5.0–8.0)

## 2018-02-06 LAB — COMPREHENSIVE METABOLIC PANEL
ALT: 20 U/L (ref 14–54)
AST: 43 U/L — ABNORMAL HIGH (ref 15–41)
Albumin: 2.7 g/dL — ABNORMAL LOW (ref 3.5–5.0)
Alkaline Phosphatase: 95 U/L (ref 38–126)
Anion gap: 9 (ref 5–15)
BUN: 18 mg/dL (ref 6–20)
CO2: 26 mmol/L (ref 22–32)
Calcium: 7.7 mg/dL — ABNORMAL LOW (ref 8.9–10.3)
Chloride: 105 mmol/L (ref 101–111)
Creatinine, Ser: 1.18 mg/dL — ABNORMAL HIGH (ref 0.44–1.00)
GFR calc Af Amer: 56 mL/min — ABNORMAL LOW (ref >60)
GFR calc non Af Amer: 48 mL/min — ABNORMAL LOW (ref >60)
Glucose, Bld: 109 mg/dL — ABNORMAL HIGH (ref 65–99)
Potassium: 2.8 mmol/L — ABNORMAL LOW (ref 3.5–5.1)
Sodium: 140 mmol/L (ref 135–145)
Total Bilirubin: 1 mg/dL (ref 0.3–1.2)
Total Protein: 6.5 g/dL (ref 6.5–8.1)

## 2018-02-06 LAB — CBC WITH DIFFERENTIAL/PLATELET
Basophils Absolute: 0 10*3/uL (ref 0.0–0.1)
Basophils Relative: 0 %
Eosinophils Absolute: 0 10*3/uL (ref 0.0–0.7)
Eosinophils Relative: 0 %
HCT: 30.7 % — ABNORMAL LOW (ref 36.0–46.0)
Hemoglobin: 9.8 g/dL — ABNORMAL LOW (ref 12.0–15.0)
Lymphocytes Relative: 9 %
Lymphs Abs: 1.2 10*3/uL (ref 0.7–4.0)
MCH: 27.1 pg (ref 26.0–34.0)
MCHC: 31.9 g/dL (ref 30.0–36.0)
MCV: 85 fL (ref 78.0–100.0)
Monocytes Absolute: 2 10*3/uL — ABNORMAL HIGH (ref 0.1–1.0)
Monocytes Relative: 16 %
Neutro Abs: 9.8 10*3/uL — ABNORMAL HIGH (ref 1.7–7.7)
Neutrophils Relative %: 75 %
Platelets: 211 10*3/uL (ref 150–400)
RBC: 3.61 MIL/uL — ABNORMAL LOW (ref 3.87–5.11)
RDW: 15.5 % (ref 11.5–15.5)
WBC: 13 10*3/uL — ABNORMAL HIGH (ref 4.0–10.5)

## 2018-02-06 LAB — RAPID URINE DRUG SCREEN, HOSP PERFORMED
Amphetamines: NOT DETECTED
Barbiturates: NOT DETECTED
Benzodiazepines: POSITIVE — AB
Cocaine: POSITIVE — AB
Opiates: NOT DETECTED
Tetrahydrocannabinol: NOT DETECTED

## 2018-02-06 LAB — URINE CULTURE: Culture: NO GROWTH

## 2018-02-06 LAB — POTASSIUM: Potassium: 3.5 mmol/L (ref 3.5–5.1)

## 2018-02-06 LAB — I-STAT CG4 LACTIC ACID, ED
Lactic Acid, Venous: 1.69 mmol/L (ref 0.5–1.9)
Lactic Acid, Venous: 0.38 mmol/L — ABNORMAL LOW (ref 0.5–1.9)

## 2018-02-06 LAB — HIV ANTIBODY (ROUTINE TESTING W REFLEX): HIV Screen 4th Generation wRfx: NONREACTIVE

## 2018-02-06 LAB — MAGNESIUM: Magnesium: 1.4 mg/dL — ABNORMAL LOW (ref 1.7–2.4)

## 2018-02-06 LAB — CBG MONITORING, ED: Glucose-Capillary: 106 mg/dL — ABNORMAL HIGH (ref 65–99)

## 2018-02-06 MED ORDER — SODIUM CHLORIDE 0.9 % IV SOLN
2.0000 g | INTRAVENOUS | Status: DC
  Administered 2018-02-07 – 2018-02-08 (×2): 2 g via INTRAVENOUS
  Filled 2018-02-06: qty 20
  Filled 2018-02-06: qty 2

## 2018-02-06 MED ORDER — POTASSIUM CHLORIDE CRYS ER 20 MEQ PO TBCR
40.0000 meq | EXTENDED_RELEASE_TABLET | Freq: Once | ORAL | Status: AC
  Administered 2018-02-06: 40 meq via ORAL
  Filled 2018-02-06: qty 2

## 2018-02-06 MED ORDER — LEVOFLOXACIN 500 MG PO TABS: 500.0000 mg | ORAL_TABLET | Freq: Every day | ORAL | 0 refills | Status: DC

## 2018-02-06 MED ORDER — ONDANSETRON HCL 4 MG PO TABS: 4.0000 mg | ORAL_TABLET | Freq: Four times a day (QID) | ORAL | Status: DC | PRN

## 2018-02-06 MED ORDER — VANCOMYCIN HCL 10 G IV SOLR
2000.0000 mg | Freq: Once | INTRAVENOUS | Status: AC
  Administered 2018-02-06: 2000 mg via INTRAVENOUS
  Filled 2018-02-06: qty 2000

## 2018-02-06 MED ORDER — ONDANSETRON HCL 4 MG/2ML IJ SOLN: 4.0000 mg | Freq: Four times a day (QID) | INTRAMUSCULAR | Status: DC | PRN

## 2018-02-06 MED ORDER — ACETAMINOPHEN 325 MG PO TABS
650.0000 mg | ORAL_TABLET | Freq: Four times a day (QID) | ORAL | Status: DC | PRN
  Administered 2018-02-06 – 2018-02-07 (×3): 650 mg via ORAL
  Filled 2018-02-06 (×3): qty 2

## 2018-02-06 MED ORDER — ADULT MULTIVITAMIN W/MINERALS CH
1.0000 | ORAL_TABLET | Freq: Every day | ORAL | Status: DC
  Administered 2018-02-06 – 2018-02-08 (×3): 1 via ORAL
  Filled 2018-02-06 (×3): qty 1

## 2018-02-06 MED ORDER — HYDROXYZINE HCL 10 MG PO TABS
10.0000 mg | ORAL_TABLET | Freq: Three times a day (TID) | ORAL | Status: DC | PRN
  Filled 2018-02-06: qty 1

## 2018-02-06 MED ORDER — OXYCODONE-ACETAMINOPHEN 10-325 MG PO TABS
1.0000 | ORAL_TABLET | ORAL | Status: DC | PRN
Start: 2018-02-06 — End: 2018-02-06

## 2018-02-06 MED ORDER — SODIUM CHLORIDE 0.9 % IV BOLUS (SEPSIS)
1000.0000 mL | Freq: Once | INTRAVENOUS | Status: AC
  Administered 2018-02-06: 1000 mL via INTRAVENOUS

## 2018-02-06 MED ORDER — PROCHLORPERAZINE MALEATE 10 MG PO TABS: 10.0000 mg | ORAL_TABLET | Freq: Four times a day (QID) | ORAL | Status: DC | PRN

## 2018-02-06 MED ORDER — SODIUM CHLORIDE 0.9 % IV SOLN
INTRAVENOUS | Status: DC
  Administered 2018-02-06 – 2018-02-08 (×7): via INTRAVENOUS

## 2018-02-06 MED ORDER — SODIUM CHLORIDE 0.9 % IV SOLN
1.0000 g | INTRAVENOUS | Status: DC
  Administered 2018-02-06: 1 g via INTRAVENOUS
  Filled 2018-02-06: qty 10

## 2018-02-06 MED ORDER — OXYCODONE-ACETAMINOPHEN 5-325 MG PO TABS: 1.0000 | ORAL_TABLET | ORAL | Status: DC | PRN

## 2018-02-06 MED ORDER — PIPERACILLIN-TAZOBACTAM 3.375 G IVPB 30 MIN
3.3750 g | Freq: Once | INTRAVENOUS | Status: AC
  Administered 2018-02-06: 3.375 g via INTRAVENOUS
  Filled 2018-02-06: qty 50

## 2018-02-06 MED ORDER — ACETAMINOPHEN 650 MG RE SUPP
650.0000 mg | Freq: Once | RECTAL | Status: AC
  Administered 2018-02-06: 650 mg via RECTAL
  Filled 2018-02-06: qty 1

## 2018-02-06 MED ORDER — ACETAMINOPHEN 650 MG RE SUPP: 650.0000 mg | Freq: Four times a day (QID) | RECTAL | Status: DC | PRN

## 2018-02-06 MED ORDER — SERTRALINE HCL 50 MG PO TABS
50.0000 mg | ORAL_TABLET | Freq: Every day | ORAL | Status: DC
  Administered 2018-02-06 – 2018-02-08 (×3): 50 mg via ORAL
  Filled 2018-02-06 (×3): qty 1

## 2018-02-06 MED ORDER — POTASSIUM CHLORIDE 10 MEQ/100ML IV SOLN
10.0000 meq | INTRAVENOUS | Status: AC
  Administered 2018-02-06 (×6): 10 meq via INTRAVENOUS
  Filled 2018-02-06 (×6): qty 100

## 2018-02-06 MED ORDER — CALCIUM CARBONATE-VITAMIN D 500-200 MG-UNIT PO TABS
1.0000 | ORAL_TABLET | Freq: Every day | ORAL | Status: DC
Start: 2018-02-06 — End: 2018-02-08
  Administered 2018-02-06 – 2018-02-08 (×3): 1 via ORAL
  Filled 2018-02-06 (×3): qty 1

## 2018-02-06 MED ORDER — ENOXAPARIN SODIUM 40 MG/0.4ML ~~LOC~~ SOLN
40.0000 mg | SUBCUTANEOUS | Status: DC
  Administered 2018-02-06 – 2018-02-08 (×3): 40 mg via SUBCUTANEOUS
  Filled 2018-02-06 (×3): qty 0.4

## 2018-02-06 MED ORDER — METHADONE HCL 5 MG PO TABS: 10.0000 mg | ORAL_TABLET | Freq: Two times a day (BID) | ORAL | Status: DC

## 2018-02-06 MED ORDER — MULTIVITAMINS PO CAPS: 1.0000 | ORAL_CAPSULE | Freq: Every day | ORAL | Status: DC

## 2018-02-06 MED ORDER — OXYCODONE HCL 5 MG PO TABS: 5.0000 mg | ORAL_TABLET | ORAL | Status: DC | PRN

## 2018-02-06 MED ORDER — ALPRAZOLAM 1 MG PO TABS: 2.0000 mg | ORAL_TABLET | Freq: Three times a day (TID) | ORAL | Status: DC | PRN

## 2018-02-06 MED ORDER — POTASSIUM CHLORIDE CRYS ER 20 MEQ PO TBCR: 20.0000 meq | EXTENDED_RELEASE_TABLET | Freq: Every day | ORAL | Status: DC

## 2018-02-06 MED ORDER — VANCOMYCIN HCL IN DEXTROSE 1-5 GM/200ML-% IV SOLN: 1000.0000 mg | Freq: Once | INTRAVENOUS | Status: DC

## 2018-02-06 MED ORDER — GABAPENTIN 300 MG PO CAPS
300.0000 mg | ORAL_CAPSULE | Freq: Every day | ORAL | Status: DC
  Administered 2018-02-06 – 2018-02-08 (×3): 300 mg via ORAL
  Filled 2018-02-06 (×3): qty 1

## 2018-02-06 MED ORDER — VITAMIN C 250 MG PO TABS
125.0000 mg | ORAL_TABLET | Freq: Every day | ORAL | Status: DC
  Administered 2018-02-06 – 2018-02-08 (×3): 125 mg via ORAL
  Filled 2018-02-06 (×4): qty 1

## 2018-02-06 MED ORDER — MAGNESIUM SULFATE 2 GM/50ML IV SOLN
2.0000 g | Freq: Once | INTRAVENOUS | Status: AC
  Administered 2018-02-06: 2 g via INTRAVENOUS
  Filled 2018-02-06: qty 50

## 2018-02-06 MED ORDER — SODIUM CHLORIDE 0.9 % IV SOLN
1.0000 g | Freq: Once | INTRAVENOUS | Status: AC
  Administered 2018-02-06: 1 g via INTRAVENOUS
  Filled 2018-02-06: qty 1

## 2018-02-06 NOTE — Progress Notes (Signed)
COURTESY NOTE: nOTE PATIENT SEEN BY OUR app YESTERDAY AND WORKED UP FOR FEVER, BLOOD CULTURE YESTERDAY ARE GROWING E COLI, SENSITIVITIES PENDING. cALLED FLOOW AND SPOKE WITH NURSE FRED WITH INFORMATION  PATIENT IS CURRENTLY ON CEFTRIAXONE/PIPERASCILLIN-TAZOBACTAM WHICH SHOULD COVER IT

## 2018-02-06 NOTE — Telephone Encounter (Signed)
TCT pt.  No answer.  Left message asking for pt to call us back.

## 2018-02-06 NOTE — Telephone Encounter (Signed)
TCT patient.  No answer.  Left message requesting a call back.  This is second call made to pt today.

## 2018-02-06 NOTE — H&P (Addendum)
History and Physical    [name]:[id number] DOB: Mar 10, 1956 DOA: 02/06/2018  PCP: Patient, No Pcp Per  Patient coming from: Home  I have personally briefly reviewed patient's old medical records in [place]  Chief Complaint: AMS  HPI: [name] is a 62 y.o. 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  She is now 12 years out from diagnosis of metastatic breast cancer.  She has obvious disease on her back and right arm.  She also has a right upper extremity angiosarcoma.  These diseases are generally stable on her current treatment which is trastuzumab and Pertuzumab together with Abraxane given every 4 weeks.  Last treated on 3/20.  Patient found at home on the floor with altered mental status by her daughter at about 34 PM.  Patient appears to have been seen by oncology clinic yesterday for "abd pain".  Started on doxycycline for unclear reason, though she didn't fill this med anyhow it seems.  UA obtained yesterday, however significant suspicion that this was "contaminated with water" (see notes from clinic).  ED Course: In the ED patient initially obtunded.  Fever 103.4.  Her mental status has slowly improved since arrival.  She reports "tumor pain" in her back.  No other pain source at this time.  WBC 13k.  CXR neg.  UA shows possible UTI, no other obvious source of infection identified.  Specifically no obvious cellulitis to her back.   Review of Systems: As per HPI otherwise 10 point review of systems negative.   Past Medical History:  Diagnosis Date  . Breast cancer ([place])   . DVT (deep venous thrombosis) ([place]) 10/07/2011  . Fever 02/06/2018  . Hypertension   . Radiation 11/29/2005-12/29/2005   right supraclavicular area 4147 cGy  . Radiation 06/26/02-08/14/02   right breast 5040 cGy, tumor bed boosted to 1260 cGy  . Rheumatic fever     Past Surgical History:  Procedure Laterality Date  . BREAST SURGERY      . MASTECTOMY     bilateral  . PORT A CATH REVISION       reports that she has been smoking.  She has never used smokeless tobacco. She reports that she drinks alcohol. She reports that she does not use drugs.  Allergies  Allergen Reactions  . Pork-Derived Products Other (See Comments)    Pt says she just doesn't eat pork; has no reaction to pork products  . Tramadol Nausea Only  . Hydrocodone-Acetaminophen Itching and Rash    Family History  Problem Relation Age of Onset  . Pancreatic cancer Mother   . Kidney cancer Sister 47  . Breast cancer Sister 63  . Breast cancer Other 33     Prior to Admission medications   Medication Sig Start Date End Date Taking? Authorizing Provider  gabapentin (NEURONTIN) 300 MG capsule Take 1 capsule (300 mg total) by mouth daily. 11/27/17  Yes Nicholas Lose, MD  hydrOXYzine (ATARAX/VISTARIL) 10 MG tablet Take 1 tablet (10 mg total) by mouth 3 (three) times daily as needed. 01/17/18  Yes [name], [name], MD  methadone (DOLOPHINE) 10 MG tablet Take 1 tablet (10 mg total) by mouth every 12 (twelve) hours. Note new dosing per pt's regimen for best control 01/24/18  Yes [name], [name], MD  alprazolam Duanne Moron) 2 MG tablet Take 2 mg by mouth 3 (three) times daily as needed. 02/01/18   [provider]  Ascorbic Acid (VITAMIN C) 100 MG  tablet Take 100 mg by mouth daily.      [provider]  calcium-vitamin D (OSCAL WITH D) 500-200 MG-UNIT per tablet Take 1 tablet by mouth daily.      [provider]  doxycycline (VIBRA-TABS) 100 MG tablet Take 1 tablet (100 mg total) by mouth 2 (two) times daily. 02/05/18   Harle Stanford., PA-C  Multiple Vitamin (MULTIVITAMIN) capsule Take 1 capsule by mouth daily.      [provider]  mupirocin nasal ointment (BACTROBAN NASAL) 2 % Place 1 application into the nose 2 (two) times daily. Use one-half of tube in each nostril twice daily for five (5) days. 08/21/17   Tanner, Lyndon Code., PA-C   oxyCODONE-acetaminophen (PERCOCET) 10-325 MG tablet Take 1 tablet by mouth every 4 (four) hours as needed for pain. 01/17/18   [name], [name], MD  potassium chloride (MICRO-K) 10 MEQ CR capsule Take 2 capsules (20 mEq total) by mouth daily. 08/15/17   [name], [name], MD  prochlorperazine (COMPAZINE) 10 MG tablet Take 1 tablet by mouth every 6 (six) hours as needed. 01/17/17   [provider]  sertraline (ZOLOFT) 50 MG tablet Take 1 tablet (50 mg total) by mouth daily. 08/16/17   [name], [name], MD  triamterene-hydrochlorothiazide (DYAZIDE) 37.5-25 MG capsule Take 1 each (1 capsule total) by mouth every morning. 01/17/18   [name], [name], MD    Physical Exam: Vitals:   02/06/18 0245 02/06/18 0304 02/06/18 0330 02/06/18 0345  BP: 125/60 125/60 (!) 126/51 (!) 121/58  Pulse: 76 77 79 79  Resp: (!) 31 17 (!) 27 (!) 29  Temp:      TempSrc:      SpO2: 96% 99% 96% 94%  Weight:      Height:        Constitutional: NAD, calm, comfortable Eyes: PERRL, lids and conjunctivae normal ENMT: Mucous membranes are moist. Posterior pharynx clear of any exudate or lesions.Normal dentition.  Neck: normal, supple, no masses, no thyromegaly Respiratory: clear to auscultation bilaterally, no wheezing, no crackles. Normal respiratory effort. No accessory muscle use.  Cardiovascular: Regular rate and rhythm, no murmurs / rubs / gallops. No extremity edema. 2+ pedal pulses. No carotid bruits.  Abdomen: no tenderness, no masses palpated. No hepatosplenomegaly. Bowel sounds positive.  Musculoskeletal: no clubbing / cyanosis. No joint deformity upper and lower extremities. Good ROM, no contractures. Normal muscle tone.  Skin:    Neurologic: CN 2-12 grossly intact. Sensation intact, DTR normal. Strength 5/5 in all 4.  Psychiatric: Now alert, awake, is falling asleep mid questioning however.   Labs on Admission: I have personally reviewed following labs and imaging studies  CBC: Recent  Labs  Lab 02/05/18 1310 02/06/18 0255  WBC 13.5* 13.0*  NEUTROABS 11.2* 9.8*  HGB  --  9.8*  HCT 32.4* 30.7*  MCV 83.3 85.0  PLT 258 505   Basic Metabolic Panel: Recent Labs  Lab 02/05/18 1310 02/06/18 0255  NA 138 140  K 3.1* 2.8*  CL 100 105  CO2 29 26  GLUCOSE 99 109*  BUN 17 18  CREATININE 1.10 1.18*  CALCIUM 8.8 7.7*   GFR: Estimated Creatinine Clearance: 54.4 mL/min (A) (by C-G formula based on SCr of 1.18 mg/dL (H)). Liver Function Tests: Recent Labs  Lab 02/05/18 1310 02/06/18 0255  AST 37* 43*  ALT 22 20  ALKPHOS 118 95  BILITOT 0.4 1.0  PROT 7.3 6.5  ALBUMIN 2.9* 2.7*   No results for input(s): LIPASE,  AMYLASE in the last 168 hours. No results for input(s): AMMONIA in the last 168 hours. Coagulation Profile: No results for input(s): INR, PROTIME in the last 168 hours. Cardiac Enzymes: No results for input(s): CKTOTAL, CKMB, CKMBINDEX, TROPONINI in the last 168 hours. BNP (last 3 results) No results for input(s): PROBNP in the last 8760 hours. HbA1C: No results for input(s): HGBA1C in the last 72 hours. CBG: Recent Labs  Lab 02/06/18 0053  GLUCAP 106*   Lipid Profile: No results for input(s): CHOL, HDL, LDLCALC, TRIG, CHOLHDL, LDLDIRECT in the last 72 hours. Thyroid Function Tests: No results for input(s): TSH, T4TOTAL, FREET4, T3FREE, THYROIDAB in the last 72 hours. Anemia Panel: No results for input(s): VITAMINB12, FOLATE, FERRITIN, TIBC, IRON, RETICCTPCT in the last 72 hours. Urine analysis:    Component Value Date/Time   COLORURINE YELLOW 02/06/2018 0045   APPEARANCEUR CLEAR 02/06/2018 0045   LABSPEC 1.012 02/06/2018 0045   PHURINE 5.0 02/06/2018 0045   GLUCOSEU NEGATIVE 02/06/2018 0045   HGBUR LARGE (A) 02/06/2018 0045   BILIRUBINUR NEGATIVE 02/06/2018 0045   KETONESUR NEGATIVE 02/06/2018 0045   PROTEINUR 100 (A) 02/06/2018 0045   UROBILINOGEN 0.2 12/29/2010 1427   NITRITE NEGATIVE 02/06/2018 0045   LEUKOCYTESUR SMALL (A)  02/06/2018 0045    Radiological Exams on Admission: Dg Chest 1 View  Result Date: 02/05/2018 CLINICAL DATA:  Cough fever and leukocytosis. EXAM: CHEST  1 VIEW COMPARISON:  01/23/2017 FINDINGS: Stable position of injectable port. Cardiomediastinal silhouette is normal. Mediastinal contours appear intact. There is no evidence of focal airspace consolidation, pleural effusion or pneumothorax. Near complete resolution of previously seen lingular airspace consolidation. Right breast/chest wall postsurgical changes. IMPRESSION: Near complete resolution of previously seen lingular airspace consolidation. Electronically Signed   By: Fidela Salisbury [name]   On: 02/05/2018 14:50   Ct Head Wo Contrast  Result Date: 02/06/2018 CLINICAL DATA:  62 year old female with altered mental status. History of breast cancer. EXAM: CT HEAD WITHOUT CONTRAST TECHNIQUE: Contiguous axial images were obtained from the base of the skull through the vertex without intravenous contrast. COMPARISON:  Head CT dated 04/25/2011 FINDINGS: Brain: The ventricles and sulci appropriate size for patient's age. Mild periventricular and deep white matter chronic microvascular ischemic changes noted. There is no acute intracranial hemorrhage. No mass effect or midline shift. There is a 1.0 x 1.5 cm CSF density area in the inferior right temporal lobe which is new compared to the prior CT and likely represents focal area of dilatation of the temporal horn of the right lateral ventricle. An area of encephalomalacia related to an old infarct or an arachnoid cyst are less likely but not excluded. This can be further evaluated with MRI. Vascular: No hyperdense vessel or unexpected calcification. Skull: Normal. Negative for fracture or focal lesion. Sinuses/Orbits: No acute finding. Other: None IMPRESSION: 1. No acute intracranial pathology. 2. Mild chronic microvascular ischemic changes. 3. Small CSF density area in the inferior medial right temporal  lobe most likely represents focal dilatation of the temporal horn of the right lateral ventricle and less likely focal encephalomalacia related to prior infarct or a small arachnoid cyst. This can be further evaluated with MRI if clinically indicated. Electronically Signed   By: Anner Crete [name]   On: 02/06/2018 03:25   Dg Chest Port 1 View  Result Date: 02/06/2018 CLINICAL DATA:  Altered mental status with fever EXAM: PORTABLE CHEST 1 VIEW COMPARISON:  02/05/2018, 01/23/2017 FINDINGS: Left-sided central venous port tip overlies the right atrium. Borderline cardiomegaly.  No acute opacity or pleural effusion. No pneumothorax. IMPRESSION: No active disease. Electronically Signed   By: Donavan Foil [name]   On: 02/06/2018 01:50    EKG: Independently reviewed.  Assessment/Plan Principal Problem:   Altered mental status Active Problems:   Malignant neoplasm of upper-inner quadrant of right breast in female, estrogen receptor positive (HCC)   Chronic pain   Acute lower UTI    1. AMS - 1. DDx includes delirium secondary to UTI 2. But more likely I am suspicious of possible accidental overdose of sedating meds / polypharmacy / use of additional narcotics in her regemin in addition to what she is being prescribed. 3. Specifically her behavior yesterday with providing a urine at the clinic is somewhat suspect. 4. Additionally she had been complaining of "withdrawal" symptoms after oncology took over prescribing following her PCPs inability to continue to prescribe narcotics due to loss off license (due to over-prescribing narcotics). 2. UTI - 1. Rocephin 2. Culture pending 3. Chronic pain - 1. Holding narcs for the moment due to suspicion of OD as discussed above 2. Resume prescribed dosage only when meds resumed possibly later today. 4. Anxiety - 1. Cont home Xanax PRN (hold for sedation) 5. BRCA - followed by Dr. Jana Hakim  DVT prophylaxis: Lovenox Code Status: Full Family Communication:  No family in room Disposition Plan: Home after admit Consults called: None Admission status: Place in [place], [place] [facility] Pager [phone]  If 7AM-7PM, please contact day team taking care of patient [email or web address] Password [email or web address]  02/06/2018, 5:15 AM

## 2018-02-06 NOTE — Patient Instructions (Signed)
Implanted Port Home Guide An implanted port is a type of central line that is placed under the skin. Central lines are used to provide IV access when treatment or nutrition needs to be given through a person's veins. Implanted ports are used for long-term IV access. An implanted port may be placed because:  You need IV medicine that would be irritating to the small veins in your hands or arms.  You need long-term IV medicines, such as antibiotics.  You need IV nutrition for a long period.  You need frequent blood draws for lab tests.  You need dialysis.  Implanted ports are usually placed in the chest area, but they can also be placed in the upper arm, the abdomen, or the leg. An implanted port has two main parts:  Reservoir. The reservoir is round and will appear as a small, raised area under your skin. The reservoir is the part where a needle is inserted to give medicines or draw blood.  Catheter. The catheter is a thin, flexible tube that extends from the reservoir. The catheter is placed into a large vein. Medicine that is inserted into the reservoir goes into the catheter and then into the vein.  How will I care for my incision site? Do not get the incision site wet. Bathe or shower as directed by your health care provider. How is my port accessed? Special steps must be taken to access the port:  Before the port is accessed, a numbing cream can be placed on the skin. This helps numb the skin over the port site.  Your health care provider uses a sterile technique to access the port. ? Your health care provider must put on a mask and sterile gloves. ? The skin over your port is cleaned carefully with an antiseptic and allowed to dry. ? The port is gently pinched between sterile gloves, and a needle is inserted into the port.  Only "non-coring" port needles should be used to access the port. Once the port is accessed, a blood return should be checked. This helps ensure that the port  is in the vein and is not clogged.  If your port needs to remain accessed for a constant infusion, a clear (transparent) bandage will be placed over the needle site. The bandage and needle will need to be changed every week, or as directed by your health care provider.  Keep the bandage covering the needle clean and dry. Do not get it wet. Follow your health care provider's instructions on how to take a shower or bath while the port is accessed.  If your port does not need to stay accessed, no bandage is needed over the port.  What is flushing? Flushing helps keep the port from getting clogged. Follow your health care provider's instructions on how and when to flush the port. Ports are usually flushed with saline solution or a medicine called heparin. The need for flushing will depend on how the port is used.  If the port is used for intermittent medicines or blood draws, the port will need to be flushed: ? After medicines have been given. ? After blood has been drawn. ? As part of routine maintenance.  If a constant infusion is running, the port may not need to be flushed.  How long will my port stay implanted? The port can stay in for as long as your health care provider thinks it is needed. When it is time for the port to come out, surgery will be   done to remove it. The procedure is similar to the one performed when the port was put in. When should I seek immediate medical care? When you have an implanted port, you should seek immediate medical care if:  You notice a bad smell coming from the incision site.  You have swelling, redness, or drainage at the incision site.  You have more swelling or pain at the port site or the surrounding area.  You have a fever that is not controlled with medicine.  This information is not intended to replace advice given to you by your health care provider. Make sure you discuss any questions you have with your health care provider. Document  Released: 10/24/2005 Document Revised: 03/31/2016 Document Reviewed: 07/01/2013 [employer] Interactive Patient Education  2017 [employer]  

## 2018-02-06 NOTE — ED Provider Notes (Addendum)
[facility] DEPT Provider Note   CSN: [id number] Arrival date & time: 02/06/18  0028  Time seen 01:06 AM   History   Chief Complaint Chief Complaint  Patient presents with  . Altered Mental Status   Level 5 caveat for altered mental status  HPI [name] is a 62 y.o. female.  HPI per EMS patient was found at home on the floor with altered mental status by her daughter at about 60 PM.  EMS reports patient was seen yesterday and given an antibiotic however when I review her chart I do not see any evidence of that.  Patient will open her eyes when talked to however when she talks her speech is mumbled.  PCP Patient, No Pcp Per Oncology Sandi Mealy, PA/ Dr Griffith Citron  Past Medical History:  Diagnosis Date  . Breast cancer ([place])   . DVT (deep venous thrombosis) ([place]) 10/07/2011  . Hypertension   . Radiation 11/29/2005-12/29/2005   right supraclavicular area 4147 cGy  . Radiation 06/26/02-08/14/02   right breast 5040 cGy, tumor bed boosted to 1260 cGy  . Rheumatic fever     Patient Active Problem List   Diagnosis Date Noted  . Goals of care, counseling/discussion 09/05/2017  . Pneumonia 01/25/2017  . CAP (community acquired pneumonia) 01/23/2017  . Port catheter in place 05/30/2016  . 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 Chronic pain 04/13/2015  . Left arm pain 08/18/2014  . Malignant neoplasm of upper-inner quadrant of right breast in female, estrogen receptor positive ([place]) 10/03/2013  . Post-lymphadenectomy lymphedema of arm 08/15/2013  . Port or reservoir infection 01/29/2013  . DVT (deep venous thrombosis) ([place]) 10/07/2011  . Chest pain 09/10/2009  . Secondary cardiomyopathy ([place]) 09/02/2009  . Essential hypertension 02/26/2009  . GERD 02/26/2009    Past Surgical History:  Procedure Laterality Date  . BREAST SURGERY    . MASTECTOMY     bilateral  . PORT A CATH REVISION       OB History   None       Home Medications    Prior to Admission medications   Medication Sig Start Date End Date Taking? Authorizing Provider  gabapentin (NEURONTIN) 300 MG capsule Take 1 capsule (300 mg total) by mouth daily. 11/27/17  Yes Nicholas Lose, MD  hydrOXYzine (ATARAX/VISTARIL) 10 MG tablet Take 1 tablet (10 mg total) by mouth 3 (three) times daily as needed. 01/17/18  Yes [name], [name], MD  methadone (DOLOPHINE) 10 MG tablet Take 1 tablet (10 mg total) by mouth every 12 (twelve) hours. Note new dosing per pt's regimen for best control 01/24/18  Yes [name], [name], MD  alprazolam Duanne Moron) 2 MG tablet Take 2 mg by mouth 3 (three) times daily as needed. 02/01/18   [provider]  Ascorbic Acid (VITAMIN C) 100 MG tablet Take 100 mg by mouth daily.      [provider]  calcium-vitamin D (OSCAL WITH D) 500-200 MG-UNIT per tablet Take 1 tablet by mouth daily.      [provider]  doxycycline (VIBRA-TABS) 100 MG tablet Take 1 tablet (100 mg total) by mouth 2 (two) times daily. 02/05/18   Harle Stanford., PA-C  Multiple Vitamin (MULTIVITAMIN) capsule Take 1 capsule by mouth daily.      [provider]  mupirocin nasal ointment (BACTROBAN NASAL) 2 % Place 1 application into the nose 2 (two) times daily. Use one-half of tube in each nostril twice daily for  five (5) days. 08/21/17   Tanner, Lyndon Code., PA-C  oxyCODONE-acetaminophen (PERCOCET) 10-325 MG tablet Take 1 tablet by mouth every 4 (four) hours as needed for pain. 01/17/18   [name], [name], MD  potassium chloride (MICRO-K) 10 MEQ CR capsule Take 2 capsules (20 mEq total) by mouth daily. 08/15/17   [name], [name], MD  prochlorperazine (COMPAZINE) 10 MG tablet Take 1 tablet by mouth every 6 (six) hours as needed. 01/17/17   [provider]  sertraline (ZOLOFT) 50 MG tablet Take 1 tablet (50 mg total) by mouth daily. 08/16/17   [name], [name], MD  triamterene-hydrochlorothiazide (DYAZIDE) 37.5-25 MG capsule  Take 1 each (1 capsule total) by mouth every morning. 01/17/18   [name], [name], MD    Family History Family History  Problem Relation Age of Onset  . Pancreatic cancer Mother   . Kidney cancer Sister 6  . Breast cancer Sister 32  . Breast cancer Other 12    Social History Social History   Tobacco Use  . Smoking status: Light Tobacco Smoker  . Smokeless tobacco: Never Used  Substance Use Topics  . Alcohol use: Yes    Comment: Rarely  . Drug use: No  lives at home Lives alone   Allergies   Pork-derived products; Tramadol; and Hydrocodone-acetaminophen   Review of Systems Review of Systems  Unable to perform ROS: Mental status change     Physical Exam Updated Vital Signs BP (!) 127/52 (BP Location: Left Arm)   Pulse 77   Temp (!) 103.1 F (39.5 C) (Oral)   Resp (!) 29   Ht 5\' 6"  (1.676 m)   Wt 85.3 kg (188 lb)   SpO2 97%   BMI 30.34 kg/m   Vital signs normal except for fever   Physical Exam  Constitutional: She appears well-developed and well-nourished.  Non-toxic appearance. She does not appear ill. No distress.  HENT:  Head: Normocephalic and atraumatic.  Right Ear: External ear normal.  Left Ear: External ear normal.  Nose: Nose normal. No mucosal edema or rhinorrhea.  Mouth/Throat: Mucous membranes are dry. No dental abscesses or uvula swelling.  Eyes: Pupils are equal, round, and reactive to light. Conjunctivae and EOM are normal.  Neck: Normal range of motion and full passive range of motion without pain. Neck supple.  Cardiovascular: Normal rate, regular rhythm and normal heart sounds. Exam reveals no gallop and no friction rub.  No murmur heard. Pulmonary/Chest: Effort normal and breath sounds normal. No respiratory distress. She has no wheezes. She has no rhonchi. She has no rales. She exhibits no tenderness and no crepitus.  Pt has a port in left chest  Abdominal: Soft. Normal appearance and bowel sounds are normal. She exhibits no  distension. There is no tenderness. There is no rebound and no guarding.  Musculoskeletal: Normal range of motion. She exhibits no edema or tenderness.  Moves all extremities well.   Patient has diffuse swelling or lymphedema of her right upper extremity and she is wearing a compression stocking.  Neurological: She has normal strength.  Patient will open her eyes and try to respond verbally when she was asked a question however it is not incomprehensible.  Skin: Skin is warm, dry and intact. No rash noted. No erythema. No pallor.  Psychiatric:  Unable to assess  Nursing note and vitals reviewed.      ED Treatments / Results  Labs (all labs ordered are listed, but only abnormal results are displayed) Results for orders  placed or performed during the hospital encounter of 02/06/18  Comprehensive metabolic panel  Result Value Ref Range   Sodium 140 135 - 145 mmol/L   Potassium 2.8 (L) 3.5 - 5.1 mmol/L   Chloride 105 101 - 111 mmol/L   CO2 26 22 - 32 mmol/L   Glucose, Bld 109 (H) 65 - 99 mg/dL   BUN 18 6 - 20 mg/dL   Creatinine, Ser 1.18 (H) 0.44 - 1.00 mg/dL   Calcium 7.7 (L) 8.9 - 10.3 mg/dL   Total Protein 6.5 6.5 - 8.1 g/dL   Albumin 2.7 (L) 3.5 - 5.0 g/dL   AST 43 (H) 15 - 41 U/L   ALT 20 14 - 54 U/L   Alkaline Phosphatase 95 38 - 126 U/L   Total Bilirubin 1.0 0.3 - 1.2 mg/dL   GFR calc non Af Amer 48 (L) >60 mL/min   GFR calc Af Amer 56 (L) >60 mL/min   Anion gap 9 5 - 15  CBC WITH DIFFERENTIAL  Result Value Ref Range   WBC 13.0 (H) 4.0 - 10.5 K/uL   RBC 3.61 (L) 3.87 - 5.11 MIL/uL   Hemoglobin 9.8 (L) 12.0 - 15.0 g/dL   HCT 30.7 (L) 36.0 - 46.0 %   MCV 85.0 78.0 - 100.0 fL   MCH 27.1 26.0 - 34.0 pg   MCHC 31.9 30.0 - 36.0 g/dL   RDW 15.5 11.5 - 15.5 %   Platelets 211 150 - 400 K/uL   Neutrophils Relative % 75 %   Neutro Abs 9.8 (H) 1.7 - 7.7 K/uL   Lymphocytes Relative 9 %   Lymphs Abs 1.2 0.7 - 4.0 K/uL   Monocytes Relative 16 %   Monocytes Absolute 2.0 (H)  0.1 - 1.0 K/uL   Eosinophils Relative 0 %   Eosinophils Absolute 0.0 0.0 - 0.7 K/uL   Basophils Relative 0 %   Basophils Absolute 0.0 0.0 - 0.1 K/uL  Urinalysis, Routine w reflex microscopic  Result Value Ref Range   Color, Urine YELLOW YELLOW   APPearance CLEAR CLEAR   Specific Gravity, Urine 1.012 1.005 - 1.030   pH 5.0 5.0 - 8.0   Glucose, UA NEGATIVE NEGATIVE mg/dL   Hgb urine dipstick LARGE (A) NEGATIVE   Bilirubin Urine NEGATIVE NEGATIVE   Ketones, ur NEGATIVE NEGATIVE mg/dL   Protein, ur 100 (A) NEGATIVE mg/dL   Nitrite NEGATIVE NEGATIVE   Leukocytes, UA SMALL (A) NEGATIVE   RBC / HPF 6-30 0 - 5 RBC/hpf   WBC, UA 6-30 0 - 5 WBC/hpf   Bacteria, UA FEW (A) NONE SEEN   Squamous Epithelial / LPF 0-5 (A) NONE SEEN   Mucus PRESENT   CBG monitoring, ED  Result Value Ref Range   Glucose-Capillary 106 (H) 65 - 99 mg/dL  I-Stat CG4 Lactic Acid, ED  (not at  [facility])  Result Value Ref Range   Lactic Acid, Venous 1.69 0.5 - 1.9 mmol/L  I-Stat CG4 Lactic Acid, ED  (not at  [facility])  Result Value Ref Range   Lactic Acid, Venous 0.38 (L) 0.5 - 1.9 mmol/L   *Note: Due to a large number of results and/or encounters for the requested time period, some results have not been displayed. A complete set of results can be found in Results Review.   Laboratory interpretation all normal except possible UTI, leukocytosis, anemia, hypokalemia, renal insufficiency     EKG None  Radiology Dg Chest 1 View  Result Date: 02/05/2018 CLINICAL DATA:  Cough fever and leukocytosis. . IMPRESSION: Near complete resolution of previously seen lingular airspace consolidation. Electronically Signed   By: Fidela Salisbury [name]   On: 02/05/2018 14:50   Ct Head Wo Contrast  Result Date: 02/06/2018 CLINICAL DATA:  62 year old female with altered mental status. History of breast cancer. EXAM: CT HEAD WITHOUT CONTRAST TECHNIQUE: Contiguous axial images were obtained from the base of the skull through the vertex  without intravenous contrast. COMPARISON:  Head CT dated 04/25/2011 FINDINGS: Brain: The ventricles and sulci appropriate size for patient's age. Mild periventricular and deep white matter chronic microvascular ischemic changes noted. There is no acute intracranial hemorrhage. No mass effect or midline shift. There is a 1.0 x 1.5 cm CSF density area in the inferior right temporal lobe which is new compared to the prior CT and likely represents focal area of dilatation of the temporal horn of the right lateral ventricle. An area of encephalomalacia related to an old infarct or an arachnoid cyst are less likely but not excluded. This can be further evaluated with MRI. Vascular: No hyperdense vessel or unexpected calcification. Skull: Normal. Negative for fracture or focal lesion. Sinuses/Orbits: No acute finding. Other: None IMPRESSION: 1. No acute intracranial pathology. 2. Mild chronic microvascular ischemic changes. 3. Small CSF density area in the inferior medial right temporal lobe most likely represents focal dilatation of the temporal horn of the right lateral ventricle and less likely focal encephalomalacia related to prior infarct or a small arachnoid cyst. This can be further evaluated with MRI if clinically indicated. Electronically Signed   By: Anner Crete [name]   On: 02/06/2018 03:25   Dg Chest Port 1 View  Result Date: 02/06/2018 CLINICAL DATA:  Altered mental status with fever EXAM: PORTABLE CHEST 1 VIEW COMPARISON:  02/05/2018, 01/23/2017 FINDINGS: Left-sided central venous port tip overlies the right atrium. Borderline cardiomegaly. No acute opacity or pleural effusion. No pneumothorax. IMPRESSION: No active disease. Electronically Signed   By: Donavan Foil [name]   On: 02/06/2018 01:50    Procedures .Critical Care Performed by: Rolland Porter, MD Authorized by: Rolland Porter, MD   Critical care provider statement:    Critical care time (minutes):  32   Critical care was necessary to treat or  prevent imminent or life-threatening deterioration of the following conditions:  Circulatory failure   Critical care was time spent personally by me on the following activities:  Discussions with consultants, examination of patient, evaluation of patient's response to treatment, ordering and review of laboratory studies, ordering and review of radiographic studies, pulse oximetry, re-evaluation of patient's condition and review of old charts   (including critical care time)  Medications Ordered in ED Medications  vancomycin (VANCOCIN) 2,000 mg in sodium chloride 0.9 % 500 mL IVPB (2,000 mg Intravenous New Bag/Given 02/06/18 0401)  sodium chloride 0.9 % bolus 1,000 mL (0 mLs Intravenous Stopped 02/06/18 0253)    And  sodium chloride 0.9 % bolus 1,000 mL (0 mLs Intravenous Stopped 02/06/18 0253)    And  sodium chloride 0.9 % bolus 1,000 mL (1,000 mLs Intravenous New Bag/Given 02/06/18 0400)  piperacillin-tazobactam (ZOSYN) IVPB 3.375 g (0 g Intravenous Stopped 02/06/18 0253)  acetaminophen (TYLENOL) suppository 650 mg (650 mg Rectal Given 02/06/18 0140)     Initial Impression / Assessment and Plan / ED Course  I have reviewed the triage vital signs and the nursing notes.  Pertinent labs & imaging results that were available during my care of the patient were reviewed by me and  considered in my medical decision making (see chart for details).     Patient was started on sepsis protocol and was given antibiotics for unknown source.  Looking at her chart I cannot see where she was seen by any provider yesterday although she did have blood work done.  I also do not see where she had a prescription for antibiotics written.  There was a delay in her lab test to result because the lab stated they never got her initial blood tests.  They had to be redrawn.   Recheck at 4:20 AM patient is much more alert now and talking that is clear.  She is complaining of pain "in the tumors in my back".  Patient was rolled  over to look at her back.  04:42 AM Dr Alcario Drought, hospitalist will admit.   Review of the [facility] shows patient gets #120 oxycodone 10/325 monthly, last filled March 14, #60 methadone 10 mg tablets last filled March 27, and #90 alprazolam 2 mg tablets last filled March 28.  She was on #90 oxymorphone 10 mg tablets monthly, last filled February 15.  Final Clinical Impressions(s) / ED Diagnoses   Final diagnoses:  Somnolence  Fever, unspecified fever cause    Plan  Admission  Rolland Porter, MD, Barbette Or, MD 02/06/18 Cosby, [place], MD 02/06/18 [phone]

## 2018-02-06 NOTE — Progress Notes (Signed)
PHARMACY - PHYSICIAN COMMUNICATION CRITICAL VALUE ALERT - BLOOD CULTURE IDENTIFICATION (BCID)  [name] is an 62 y.o. female who presented to [facility] on 02/06/2018 with a chief complaint of altered mental status  Assessment:  Patient sent from [facility] - currently on Rocephin for suspected UTI.   Name of physician (or Provider) Contacted: Adhikari  Current antibiotics: Rocephin 1gm IV q24h  Changes to prescribed antibiotics recommended: Increased Rocephin 2gm IV q24h Recommendations accepted by provider  Blood cx from [facility] drawn 4/1 at 1422- growing GNR (BCID+Ecoli) No results found. However, due to the size of the patient record, not all encounters were searched. Please check Results Review for a complete set of results.  Biagio Borg 02/06/2018  7:22 AM

## 2018-02-06 NOTE — Progress Notes (Signed)
PHARMACY - PHYSICIAN COMMUNICATION CRITICAL VALUE ALERT - BLOOD CULTURE IDENTIFICATION (BCID)  [name] is an 62 y.o. female who presented to [facility] on 02/06/2018 with a chief complaint of altered mental status.   Assessment: Blood cultures from [facility] drawn 4/1 growing E.coli, suspected source UTI. Ceftriaxone dose was increased from 1g to 2g IV q24h earlier today. Repeat blood cultures from today now also growing E.coli.   Name of physician (or Provider) Contacted: Dr. Tawanna Solo  Current antibiotics: Ceftriaxone 2g IV q24h   Changes to prescribed antibiotics recommended:  Patient is on recommended antibiotics - No changes needed  Results for orders placed or performed during the hospital encounter of 02/06/18  Blood Culture ID Panel (Reflexed) (Collected: 02/06/2018 12:55 AM)  Result Value Ref Range   Enterococcus species NOT DETECTED NOT DETECTED   Listeria monocytogenes NOT DETECTED NOT DETECTED   Staphylococcus species NOT DETECTED NOT DETECTED   Staphylococcus aureus NOT DETECTED NOT DETECTED   Streptococcus species NOT DETECTED NOT DETECTED   Streptococcus agalactiae NOT DETECTED NOT DETECTED   Streptococcus pneumoniae NOT DETECTED NOT DETECTED   Streptococcus pyogenes NOT DETECTED NOT DETECTED   Acinetobacter baumannii NOT DETECTED NOT DETECTED   Enterobacteriaceae species DETECTED (A) NOT DETECTED   Enterobacter cloacae complex NOT DETECTED NOT DETECTED   Escherichia coli DETECTED (A) NOT DETECTED   Klebsiella oxytoca NOT DETECTED NOT DETECTED   Klebsiella pneumoniae NOT DETECTED NOT DETECTED   Proteus species NOT DETECTED NOT DETECTED   Serratia marcescens NOT DETECTED NOT DETECTED   Carbapenem resistance NOT DETECTED NOT DETECTED   Haemophilus influenzae NOT DETECTED NOT DETECTED   Neisseria meningitidis NOT DETECTED NOT DETECTED   Pseudomonas aeruginosa NOT DETECTED NOT DETECTED   Candida albicans NOT DETECTED NOT DETECTED   Candida glabrata NOT DETECTED NOT  DETECTED   Candida krusei NOT DETECTED NOT DETECTED   Candida parapsilosis NOT DETECTED NOT DETECTED   Candida tropicalis NOT DETECTED NOT DETECTED    Luiz Ochoa 02/06/2018  6:38 PM

## 2018-02-06 NOTE — Progress Notes (Signed)
A consult was received from an ED physician for zosyn and vancomycin per pharmacy dosing.  The patient's profile has been reviewed for ht/wt/allergies/indication/available labs.   A one time order has been placed for Zosyn 3.375 gm and Vancomycin 2 Gm.  Further antibiotics/pharmacy consults should be ordered by admitting physician if indicated.                       Thank you, Dorrene German 02/06/2018  1:28 AM

## 2018-02-06 NOTE — Progress Notes (Signed)
[name]   DOB:05-Dec-1955   SF#:[id number]   [id number]#:[id number]   Interval History: [name] presented to our office 02/05/2018 complaining of poorly controlled pain and fever.  She complained of pain in the abdomen, constipation, and difficulty urination.  She was found to be febrile, with a high white cell count, and was pancultured.  Blood cultures were obtained but the patient apparently simply put water in a urine cup.  The urinalysis was consistent with water and urine culture was negative.  Blood culture however grew E. coli, sensitivities pending.  The patient had been started on doxycycline (a prescription she did not fill) and was called multiple times but we were not able to reach her.   Blinda tells me yesterday she was "out of her mind", making no sense, and unable to get out of bed.  Her husband suggested she go to the emergency room but she refused.  Eventually she ended up on the floor.  Her family was unable to get her up.  They called 911 and brought her to the emergency room early this morning, initially obtunded, with a fever of 103.4.  She was admitted, started on penicillin/tazobactam and ceftriaxone.  Her mental status has significantly improved since admission  Subjective:  [name] was able to give me a fairly coherent history of what happened yesterday.  She is not aware of any phone calls from [name].  She explained that she did not fill the antibiotics we wrote for her because what it went out of her mind".  Asked if she is currently having pain she says she cannot tell.  She thinks she will be in pain when she gets out of bed but she is in pain sometimes when she is in bed as well.  She says she is having normal bowel movements.  She denies unusual headaches, visual changes, nausea, vomiting, cough, phlegm production, or pleurisy.  No family in room  Objective: 62-year-old African-American woman examined in bed Vitals:   02/06/18 0733 02/06/18 1514  BP: (!) 122/49 108/85   Pulse: 82 83  Resp:  18  Temp: (!) 101.4 F (38.6 C) 97.8 F (36.6 C)  SpO2: 92% 100%    Body mass index is 31.73 kg/m. No intake or output data in the 24 hours ending 02/06/18 1829   Sclerae unicteric  Lungs no rales or wheezes--auscultated anterolaterally  Heart regular rate and rhythm  Abdomen soft, +BS  Musculoskeletal: Chronic right upper extremity lymphedema  Neuro nonfocal  Breast exam: Deferred  Skin: Chronic lesions as previously imaged  CBG (last 3)  Recent Labs    02/06/18 0053  GLUCAP 106*     Labs:  Lab Results  Component Value Date   WBC 13.0 (H) 02/06/2018   HGB 9.8 (L) 02/06/2018   HCT 30.7 (L) 02/06/2018   MCV 85.0 02/06/2018   PLT 211 02/06/2018   NEUTROABS 9.8 (H) 02/06/2018    _0 @  Urine Studies No results for input(s): UHGB, CRYS in the last 72 hours.  Invalid input(s): UACOL, UAPR, USPG, UPH, UTP, UGL, UKET, UBIL, UNIT, UROB, ULEU, UEPI, UWBC, URBC, UBAC, CAST, UCOM, BILUA  Basic Metabolic Panel: Recent Labs  Lab 02/05/18 1310 02/06/18 0255  NA 138 140  K 3.1* 2.8*  CL 100 105  CO2 29 26  GLUCOSE 99 109*  BUN 17 18  CREATININE 1.10 1.18*  CALCIUM 8.8 7.7*  MG  --  1.4*   GFR Estimated Creatinine Clearance: 53.7 mL/min (A) (by C-G formula based on  SCr of 1.18 mg/dL (H)). Liver Function Tests: Recent Labs  Lab 02/05/18 1310 02/06/18 0255  AST 37* 43*  ALT 22 20  ALKPHOS 118 95  BILITOT 0.4 1.0  PROT 7.3 6.5  ALBUMIN 2.9* 2.7*   No results for input(s): LIPASE, AMYLASE in the last 168 hours. No results for input(s): AMMONIA in the last 168 hours. Coagulation profile No results for input(s): INR, PROTIME in the last 168 hours.  CBC: Recent Labs  Lab 02/05/18 1310 02/06/18 0255  WBC 13.5* 13.0*  NEUTROABS 11.2* 9.8*  HGB  --  9.8*  HCT 32.4* 30.7*  MCV 83.3 85.0  PLT 258 211   Cardiac Enzymes: No results for input(s): CKTOTAL, CKMB, CKMBINDEX, TROPONINI in the last 168 hours. BNP: Invalid  input(s): POCBNP CBG: Recent Labs  Lab 02/06/18 0053  GLUCAP 106*   D-Dimer No results for input(s): DDIMER in the last 72 hours. Hgb A1c No results for input(s): HGBA1C in the last 72 hours. Lipid Profile No results for input(s): CHOL, HDL, LDLCALC, TRIG, CHOLHDL, LDLDIRECT in the last 72 hours. Thyroid function studies No results for input(s): TSH, T4TOTAL, T3FREE, THYROIDAB in the last 72 hours.  Invalid input(s): FREET3 Anemia work up No results for input(s): VITAMINB12, FOLATE, FERRITIN, TIBC, IRON, RETICCTPCT in the last 72 hours. Microbiology Recent Results (from the past 240 hour(s))  Culture, Blood     Status: None (Preliminary result)   Collection Time: 02/05/18  2:22 PM  Result Value Ref Range Status   Specimen Description BLOOD PORTA CATH  Final   Special Requests   Final    BOTTLES DRAWN AEROBIC AND ANAEROBIC Blood Culture adequate volume   Culture  Setup Time   Final    GRAM NEGATIVE RODS IN BOTH AEROBIC AND ANAEROBIC BOTTLES CRITICAL RESULT CALLED TO, READ BACK BY AND VERIFIED WITH: Sheffield Slider [facility] 02/06/18 0719 JDW Performed at [facility], [address] [place], [place] [address]    Culture GRAM NEGATIVE RODS  Final   Report Status PENDING  Incomplete  Blood Culture ID Panel (Reflexed)     Status: Abnormal   Collection Time: 02/05/18  2:22 PM  Result Value Ref Range Status   Enterococcus species NOT DETECTED NOT DETECTED Final   Listeria monocytogenes NOT DETECTED NOT DETECTED Final   Staphylococcus species NOT DETECTED NOT DETECTED Final   Staphylococcus aureus NOT DETECTED NOT DETECTED Final   Streptococcus species NOT DETECTED NOT DETECTED Final   Streptococcus agalactiae NOT DETECTED NOT DETECTED Final   Streptococcus pneumoniae NOT DETECTED NOT DETECTED Final   Streptococcus pyogenes NOT DETECTED NOT DETECTED Final   Acinetobacter baumannii NOT DETECTED NOT DETECTED Final   Enterobacteriaceae species DETECTED (A) NOT DETECTED Final     Comment: Enterobacteriaceae represent a large family of gram-negative bacteria, not a single organism. CRITICAL RESULT CALLED TO, READ BACK BY AND VERIFIED WITH: Sheffield Slider [facility] 02/06/18 0719 JDW    Enterobacter cloacae complex NOT DETECTED NOT DETECTED Final   Escherichia coli DETECTED (A) NOT DETECTED Final    Comment: CRITICAL RESULT CALLED TO, READ BACK BY AND VERIFIED WITH: Sheffield Slider [facility] 02/06/18 0719 JDW    Klebsiella oxytoca NOT DETECTED NOT DETECTED Final   Klebsiella pneumoniae NOT DETECTED NOT DETECTED Final   Proteus species NOT DETECTED NOT DETECTED Final   Serratia marcescens NOT DETECTED NOT DETECTED Final   Carbapenem resistance NOT DETECTED NOT DETECTED Final   Haemophilus influenzae NOT DETECTED NOT DETECTED Final   Neisseria meningitidis NOT  DETECTED NOT DETECTED Final   Pseudomonas aeruginosa NOT DETECTED NOT DETECTED Final   Candida albicans NOT DETECTED NOT DETECTED Final   Candida glabrata NOT DETECTED NOT DETECTED Final   Candida krusei NOT DETECTED NOT DETECTED Final   Candida parapsilosis NOT DETECTED NOT DETECTED Final   Candida tropicalis NOT DETECTED NOT DETECTED Final    Comment: Performed at [facility], [place] [address] [place], [place] [address]  Urine Culture     Status: None   Collection Time: 02/05/18  2:32 PM  Result Value Ref Range Status   Specimen Description   Final    URINE, CLEAN CATCH Performed at [facility], [place] [address] [place], [place] [address]    Special Requests   Final    URINE, CLEAN CATCH Performed at [facility], [place] [address] [place], [place] [address]    Culture   Final    NO GROWTH Performed at [facility], [place] [address] [place], [place] [address]    Report Status 02/06/2018 FINAL  Final  Blood Culture (routine x 2)     Status: None (Preliminary result)   Collection Time: 02/06/18 12:55 AM  Result Value Ref Range Status   Specimen Description    Final    BLOOD BLOOD LEFT FOREARM Performed at [place] [address] [place], [place] [address]    Special Requests   Final    BOTTLES DRAWN AEROBIC AND ANAEROBIC Blood Culture adequate volume Performed at [place] [address], [place], [place] [address]    Culture  Setup Time   Final    GRAM NEGATIVE RODS ANAEROBIC BOTTLE ONLY Organism ID to follow Performed at [facility], [place] [address] [place], [place] [address]    Culture PENDING  Incomplete   Report Status PENDING  Incomplete  Blood Culture (routine x 2)     Status: None (Preliminary result)   Collection Time: 02/06/18  1:20 AM  Result Value Ref Range Status   Specimen Description   Final    BLOOD PORTA CATH Performed at [place] [address] [place], [place] [address]    Special Requests   Final    BOTTLES DRAWN AEROBIC AND ANAEROBIC Blood Culture adequate volume Performed at [place] [address] [place], [place] [address]    Culture  Setup Time   Final    GRAM NEGATIVE RODS ANAEROBIC BOTTLE ONLY Performed at [facility], [place] [address], [place], [place] [address]    Culture PENDING  Incomplete   Report Status PENDING  Incomplete      Studies:  Dg Chest 1 View  Result Date: 02/05/2018 CLINICAL DATA:  Cough fever and leukocytosis. EXAM: CHEST  1 VIEW COMPARISON:  01/23/2017 FINDINGS: Stable position of injectable port. Cardiomediastinal silhouette is normal. Mediastinal contours appear intact. There is no evidence of focal airspace consolidation, pleural effusion or pneumothorax. Near complete resolution of previously seen lingular airspace consolidation. Right breast/chest wall postsurgical changes. IMPRESSION: Near complete resolution of previously seen lingular airspace consolidation. Electronically Signed   By: Fidela Salisbury [name]   On: 02/05/2018 14:50   Ct Head Wo Contrast  Result  Date: 02/06/2018 CLINICAL DATA:  62 year old female with altered mental status. History of breast cancer. EXAM: CT HEAD WITHOUT CONTRAST TECHNIQUE: Contiguous axial images were obtained from the base of the skull through the vertex without intravenous contrast. COMPARISON:  Head CT dated 04/25/2011 FINDINGS: Brain: The ventricles and sulci appropriate size for patient's age. Mild periventricular and deep white matter chronic microvascular ischemic changes noted. There is no acute intracranial hemorrhage. No mass effect or midline shift. There is a 1.0 x 1.5 cm CSF density area in the inferior right temporal lobe which is new compared to the prior CT and likely represents focal area of dilatation of the temporal horn of the right lateral ventricle. An area of encephalomalacia related to an old infarct or an arachnoid cyst are less likely but not excluded. This can be further evaluated with MRI. Vascular: No hyperdense vessel or unexpected calcification. Skull: Normal. Negative for fracture or focal lesion. Sinuses/Orbits: No acute finding. Other: None IMPRESSION: 1. No acute intracranial pathology. 2. Mild chronic microvascular ischemic changes. 3. Small CSF density area in the inferior medial right temporal lobe most likely represents focal dilatation of the temporal horn of the right lateral ventricle and less likely focal encephalomalacia related to prior infarct or a small arachnoid cyst. This can be further evaluated with MRI if clinically indicated. Electronically Signed   By: Anner Crete [name]   On: 02/06/2018 03:25   Dg Chest Port 1 View  Result Date: 02/06/2018 CLINICAL DATA:  Altered mental status with fever EXAM: PORTABLE CHEST 1 VIEW COMPARISON:  02/05/2018, 01/23/2017 FINDINGS: Left-sided central venous port tip overlies the right atrium. Borderline cardiomegaly. No acute opacity or pleural effusion. No pneumothorax. IMPRESSION: No active disease. Electronically Signed   By: Donavan Foil [name]    On: 02/06/2018 01:50    Assessment: 62 y.o.  [place] woman with stage IV breast cancer manifested chiefly by loco-regional nodal disease (neck, chest) and skin involvement, without liver, bone, or brain metastases documented   (1) Status post right upper inner quadrant lumpectomy and sentinel lymph node sampling 03/04/2002 for 2 separate foci of invasive ductal carcinoma, mpT2 pN1 or stage IIB, both foci grade 3, both estrogen and progesterone receptor positive, both HercepTest 3+, Mib-1 56%  (2) Reexcision for margins 05/27/2002 showed no residual cancer in the breast.  (3) The patient refused adjuvant systemic therapy.  (4) Adjuvant radiation treatment completed 08/14/2002.  (5) recurrence in the right breast in 02/2004 showing a morphologically different tumor, again grade 3, again estrogen and progesterone receptor negative, with an MIB-1 of 14% and Herceptest 3+.  (5) Between 03/2004 and 07/2004 she received dose dense Doxorubicin/Cyclophosphamide x 4 given with trastuzumab, followed by weekly Docetaxel x 8, again given with trastuzumab.  (6) Right mastectomy 07/13/2004 showed scattered microscopic foci of residual disease over an area  greater than 5 cm. Margins were negative.  (7) Postoperative Docetaxel continued until 09/2004.  (8) Trastuzumab (Herceptin) given 08/2004 through 01/2012 with some brief interruptions.  (9) Isolated right cervical nodal recurrence 10/2005, treated with radiation to the right supraclavicular area (total 41.5 gray) completed 12/29/2005.  (10) Navelbine given together with Herceptin  11/2005 through 03/2006.  (9) Left mastectomy 02/13/2006 for ductal carcinoma in situ, grade 2, estrogen and progesterone receptor negative, with negative margins; 0 of 3 lymph nodes involved  (10) treated with Lapatinib and Capecitabine before 10/2009, for an unclear duration and with unclear results (cannot locate data on chart review).  (11) Status  post right supraclavicular lymph node biopsy 09/2010 again positive for an invasive ductal carcinoma, estrogen and progesterone receptor negative, HER-2 positive by CISH with a ratio 4.25.  (12) Navelbine given together with Herceptin between 05/2011 and 11/2011.  (13) Carboplatin/ Gemcitabine/ Herceptin given  for 2 cycles, in 12/2011 and 01/2012.  (14) TDM-1 (Kadcyla) started 02/2012. Last dose 10/02/2013 after which the patient discontinued treatment at her own discretion. Echo on December 2014 showed a well preserved ejection fraction.  (15) Deep vein thrombosis of the right upper extremity documented 04/20/2011.  She completed anticoagulant therapy with Coumadin on 03/25/2013.  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 Left chest Port-A-Cath placed on 04/08/2013; being flushed every 6 weeks  RIGHT UPPER EXTREMITY ANGIOSARCOMA VS BREAST CANCER: August 2016 (18) treated at [facility] [place] August 2016 with paclitaxel, trastuzumab and pertuzumab 1 cycle, afterwards referred to hospice  (19) under the care of hospice of [facility] August 2016 to 05/08/2016, when the patient opted for a second try at chemotherapy  (20) started low-dose Abraxane, trastuzumab and pertuzumab 06/07/2016, to be repeated every 3 weeks.              (a) pretreatment echocardiogram 06/06/2016 showed a 60-65% ejection fraction             (b) significant compliance problems compromise response to treatment             (c)  echocardiogram 02/20/17 LVEF 55-60%             (d) Abraxane discontinued 07/04/2017 because of possible neuropathy symptoms             (e) Abraxane resumed 09/27/2017             (f) echocardiogram 07/20/2017 showed an  ejection fraction of 60-65%             (g) echo 01/22/2018 showed an ejection fraction of 50-55%  (21) Chronic pain secondary to known metastatic disease             (a) patient signed pain contract 10/19/2016             (b) as of 11/16/2016 receives her pain medications from Dr [name]             (c)  Dr [name] no longer able to prescribe as of February 2019  Plan:  Rhealyn is recovering from her sepsis.  Once sensitivities are available it may be possible to simplify her regimen and possibly switch her to an oral regimen which would allow her earlier discharge.  This would be her preference.  She continues to be very concerned about pain medication.  Currently she is only on Tylenol.  I have suggested that she has more pain she should be started on methadone, 5 mg 3 times daily around the clock.  She says methadone makes her nauseated.  I suggested we could give her Compazine 5 mg together with each methadone dose.  However she does not want to take methadone.    She has an appointment in our office 02/21/2018 for treatment and follow-up  Appreciate your care to this complex patient.  I will follow with you while she is in-house    Chauncey Cruel, MD 02/06/2018  6:29 PM Medical Oncology and Hematology [facility] [address] [place], [place] [address] Tel. [phone]    Fax. [phone]

## 2018-02-06 NOTE — ED Triage Notes (Signed)
Pt comes to ed via ems, c/o altered mental status. Found on floor at 11pm, was just here yesterday given antibiotics. Did not fill prescription. Daughter found here tonight. V/s on 136/74, hr 90, rr20, 96 spo2  Room air cbg 151, temp 101.7 temporal scan. Port in left side. Active chemo pt.  Pt comes from home/.

## 2018-02-06 NOTE — Progress Notes (Signed)
These preliminary result these preliminary results were noted.  Awaiting final report.

## 2018-02-06 NOTE — ED Notes (Signed)
Bed: [facility] Expected date:  Expected time:  Means of arrival:  Comments: 62 yr old, found on floor, fever

## 2018-02-06 NOTE — Progress Notes (Signed)
PROGRESS NOTE    [name]  GGY:[id number] DOB: Nov 12, 1955 DOA: 02/06/2018 PCP: Patient, No Pcp Per   Brief Narrative: Patient is a 62 year old female with past medical history significant for metastatic breast cancer metastases to the back, right upper extremity,possible Sarcoma, chronic pain syndrome, history of recurrent UTI who was brought to the emergency department by her daughter after she was found to be altered. Patient was seen by her oncologist Dr. Jana Hakim at his office.  Blood cultures were also taken at that time.Blood culture reported today to have been growing E. Coli.  She was also reported to have prescribed doxycycline because she was found to be febrile. Patient was admitted for the management of altered mental status and possible sepsis.    Assessment & Plan:   Principal Problem:   Altered mental status Active Problems:   Malignant neoplasm of upper-inner quadrant of right breast in female, estrogen receptor positive (HCC)   Chronic pain   Acute lower UTI   Bacteremia   Polypharmacy  Altered mental status: Could be multifactorial.  Could be from polypharmacy or it could be metabolic encephalopathy from UTI and sepsis.  Currently mental status has improved and she is close to her baseline.  She is alert and oriented.  E. coli bacteremia: Blood cultures sent in the oncology office has been reported to be growing E. coli.  Waiting for sensitivity.  Blood cultures have also been sent here.  We will follow the final report.  Currently she is on ceftriaxone 2 g daily.  Patient is still febrile this morning.  Her blood pressure is stable.  Continue IV fluids.  History of polypharmacy: She has history of chronic pain syndrome patient was on multiple pain medication, and that he medications at home.  She was following with Dr. [name], who is her primary care physician. It was reported that he lost his license because he was over prescribing the pain medications. Dr.  Jana Hakim has put her on Methadone 10 mg BID.  He recommends that if the pain is uncontrolled,we can try methadone 5 mg 3 times daily. Patient is obviously pain medication seeking.  History of metastatic right breast cancer/Sarcoma: Follows with Dr. Jana Hakim..She is on trastuzumab and Pertuzumab together with Abraxane given every 4 weeks.  Last treated on 3/20. She had  lesions on her right and back which are suspected to either metastatic breast lesions but also sarcoma lesions from radiation therapy.Dr. Jana Hakim will be following her here.  UTI: Patient reports to have urinary symptoms with dribbling and burning sensation while passing urine.  Urine culture have been sent.  Most likely this is the source of E. coli bacteremia.  Continue ceftriaxone.  Anxiety: On Xanax PRN.Also on Zoloft  Severe hypokalemia/hypomagnesemia: Currently being supplemented.  Will monitor the levels.  Leukocytosis: We will continue to monitor the white cell count trend.  Mild AKI: Continue IV fluids   DVT prophylaxis: Lovenox Code Status: Full Family Communication: None present at the bedside Disposition Plan: Home after resolution of sepsis   Consultants: Oncology  Procedures: None  Antimicrobials: Ceftriaxone since 02/06/18  Subjective: Patient seen and examined the bedside this morning.  Her mental status has improved and she is currently on her baseline.  Currently alert and oriented.  Still febrile.  Blood pressure stable Objective: Vitals:   02/06/18 0545 02/06/18 0600 02/06/18 0733 02/06/18 0747  BP: (!) 121/110 (!) 134/54 (!) 122/49   Pulse: 100 80 82   Resp: (!) 26 (!) 28  Temp:   (!) 101.4 F (38.6 C)   TempSrc:   Oral   SpO2: 99% 100% 92%   Weight:    86.5 kg (190 lb 11.2 oz)  Height:   5\' 5"  (1.651 m)    No intake or output data in the 24 hours ending 02/06/18 1430 Filed Weights   02/06/18 0126 02/06/18 0747  Weight: 85.3 kg (188 lb) 86.5 kg (190 lb 11.2 oz)     Examination:  General exam: Appears calm and comfortable ,Not in distress,average built,weak HEENT:PERRL,Oral mucosa moist, Ear/Nose normal on gross exam Respiratory system: Bilateral equal air entry, normal vesicular breath sounds, no wheezes or crackles  Cardiovascular system: S1 & S2 heard, RRR. No JVD, murmurs, rubs, gallops or clicks. No pedal edema. Gastrointestinal system: Abdomen is nondistended, soft and nontender. No organomegaly or masses felt. Normal bowel sounds heard. Central nervous system: Alert and oriented. No focal neurological deficits. Extremities: No edema, no clubbing ,no cyanosis, distal peripheral pulses palpable. Edema of the right upper extremity with dark brown/black extensively scattered lesions, similar lesions on the back Skin: No rashes, lesions or ulcers,no icterus ,no pallor MSK: Normal muscle bulk,tone ,power Psychiatry: Judgement and insight appear normal. Mood & affect appropriate.     Data Reviewed: I have personally reviewed following labs and imaging studies  CBC: Recent Labs  Lab 02/05/18 1310 02/06/18 0255  WBC 13.5* 13.0*  NEUTROABS 11.2* 9.8*  HGB  --  9.8*  HCT 32.4* 30.7*  MCV 83.3 85.0  PLT 258 063   Basic Metabolic Panel: Recent Labs  Lab 02/05/18 1310 02/06/18 0255  NA 138 140  K 3.1* 2.8*  CL 100 105  CO2 29 26  GLUCOSE 99 109*  BUN 17 18  CREATININE 1.10 1.18*  CALCIUM 8.8 7.7*  MG  --  1.4*   GFR: Estimated Creatinine Clearance: 53.7 mL/min (A) (by C-G formula based on SCr of 1.18 mg/dL (H)). Liver Function Tests: Recent Labs  Lab 02/05/18 1310 02/06/18 0255  AST 37* 43*  ALT 22 20  ALKPHOS 118 95  BILITOT 0.4 1.0  PROT 7.3 6.5  ALBUMIN 2.9* 2.7*   No results for input(s): LIPASE, AMYLASE in the last 168 hours. No results for input(s): AMMONIA in the last 168 hours. Coagulation Profile: No results for input(s): INR, PROTIME in the last 168 hours. Cardiac Enzymes: No results for input(s): CKTOTAL,  CKMB, CKMBINDEX, TROPONINI in the last 168 hours. BNP (last 3 results) No results for input(s): PROBNP in the last 8760 hours. HbA1C: No results for input(s): HGBA1C in the last 72 hours. CBG: Recent Labs  Lab 02/06/18 0053  GLUCAP 106*   Lipid Profile: No results for input(s): CHOL, HDL, LDLCALC, TRIG, CHOLHDL, LDLDIRECT in the last 72 hours. Thyroid Function Tests: No results for input(s): TSH, T4TOTAL, FREET4, T3FREE, THYROIDAB in the last 72 hours. Anemia Panel: No results for input(s): VITAMINB12, FOLATE, FERRITIN, TIBC, IRON, RETICCTPCT in the last 72 hours. Sepsis Labs: Recent Labs  Lab 02/06/18 0103 02/06/18 0313  LATICACIDVEN 1.69 0.38*    Recent Results (from the past 240 hour(s))  Culture, Blood     Status: None (Preliminary result)   Collection Time: 02/05/18  2:22 PM  Result Value Ref Range Status   Specimen Description BLOOD PORTA CATH  Final   Special Requests   Final    BOTTLES DRAWN AEROBIC AND ANAEROBIC Blood Culture adequate volume   Culture  Setup Time   Final    GRAM NEGATIVE RODS IN BOTH  AEROBIC AND ANAEROBIC BOTTLES CRITICAL RESULT CALLED TO, READ BACK BY AND VERIFIED WITH: Sheffield Slider [facility] 02/06/18 0719 JDW Performed at [facility], [address], [place], [place] [address]    Culture GRAM NEGATIVE RODS  Final   Report Status PENDING  Incomplete  Blood Culture ID Panel (Reflexed)     Status: Abnormal   Collection Time: 02/05/18  2:22 PM  Result Value Ref Range Status   Enterococcus species NOT DETECTED NOT DETECTED Final   Listeria monocytogenes NOT DETECTED NOT DETECTED Final   Staphylococcus species NOT DETECTED NOT DETECTED Final   Staphylococcus aureus NOT DETECTED NOT DETECTED Final   Streptococcus species NOT DETECTED NOT DETECTED Final   Streptococcus agalactiae NOT DETECTED NOT DETECTED Final   Streptococcus pneumoniae NOT DETECTED NOT DETECTED Final   Streptococcus pyogenes NOT DETECTED NOT DETECTED Final   Acinetobacter  baumannii NOT DETECTED NOT DETECTED Final   Enterobacteriaceae species DETECTED (A) NOT DETECTED Final    Comment: Enterobacteriaceae represent a large family of gram-negative bacteria, not a single organism. CRITICAL RESULT CALLED TO, READ BACK BY AND VERIFIED WITH: Sheffield Slider [facility] 02/06/18 0719 JDW    Enterobacter cloacae complex NOT DETECTED NOT DETECTED Final   Escherichia coli DETECTED (A) NOT DETECTED Final    Comment: CRITICAL RESULT CALLED TO, READ BACK BY AND VERIFIED WITH: Sheffield Slider [facility] 02/06/18 0719 JDW    Klebsiella oxytoca NOT DETECTED NOT DETECTED Final   Klebsiella pneumoniae NOT DETECTED NOT DETECTED Final   Proteus species NOT DETECTED NOT DETECTED Final   Serratia marcescens NOT DETECTED NOT DETECTED Final   Carbapenem resistance NOT DETECTED NOT DETECTED Final   Haemophilus influenzae NOT DETECTED NOT DETECTED Final   Neisseria meningitidis NOT DETECTED NOT DETECTED Final   Pseudomonas aeruginosa NOT DETECTED NOT DETECTED Final   Candida albicans NOT DETECTED NOT DETECTED Final   Candida glabrata NOT DETECTED NOT DETECTED Final   Candida krusei NOT DETECTED NOT DETECTED Final   Candida parapsilosis NOT DETECTED NOT DETECTED Final   Candida tropicalis NOT DETECTED NOT DETECTED Final    Comment: Performed at [facility], [place] [address], [place], [place] [address]  Urine Culture     Status: None   Collection Time: 02/05/18  2:32 PM  Result Value Ref Range Status   Specimen Description   Final    URINE, CLEAN CATCH Performed at [facility], [place] [address] [place], [place] [address]    Special Requests   Final    URINE, CLEAN CATCH Performed at [facility], [place] [address] [place], [place] [address]    Culture   Final    NO GROWTH Performed at [facility], [place] [address], [place], [place] [address]    Report Status 02/06/2018 FINAL  Final         Radiology Studies: Dg Chest 1  View  Result Date: 02/05/2018 CLINICAL DATA:  Cough fever and leukocytosis. EXAM: CHEST  1 VIEW COMPARISON:  01/23/2017 FINDINGS: Stable position of injectable port. Cardiomediastinal silhouette is normal. Mediastinal contours appear intact. There is no evidence of focal airspace consolidation, pleural effusion or pneumothorax. Near complete resolution of previously seen lingular airspace consolidation. Right breast/chest wall postsurgical changes. IMPRESSION: Near complete resolution of previously seen lingular airspace consolidation. Electronically Signed   By: Fidela Salisbury [name]   On: 02/05/2018 14:50   Ct Head Wo Contrast  Result Date: 02/06/2018 CLINICAL DATA:  62 year old female with altered  mental status. History of breast cancer. EXAM: CT HEAD WITHOUT CONTRAST TECHNIQUE: Contiguous axial images were obtained from the base of the skull through the vertex without intravenous contrast. COMPARISON:  Head CT dated 04/25/2011 FINDINGS: Brain: The ventricles and sulci appropriate size for patient's age. Mild periventricular and deep white matter chronic microvascular ischemic changes noted. There is no acute intracranial hemorrhage. No mass effect or midline shift. There is a 1.0 x 1.5 cm CSF density area in the inferior right temporal lobe which is new compared to the prior CT and likely represents focal area of dilatation of the temporal horn of the right lateral ventricle. An area of encephalomalacia related to an old infarct or an arachnoid cyst are less likely but not excluded. This can be further evaluated with MRI. Vascular: No hyperdense vessel or unexpected calcification. Skull: Normal. Negative for fracture or focal lesion. Sinuses/Orbits: No acute finding. Other: None IMPRESSION: 1. No acute intracranial pathology. 2. Mild chronic microvascular ischemic changes. 3. Small CSF density area in the inferior medial right temporal lobe most likely represents focal dilatation of the temporal horn of  the right lateral ventricle and less likely focal encephalomalacia related to prior infarct or a small arachnoid cyst. This can be further evaluated with MRI if clinically indicated. Electronically Signed   By: Anner Crete [name]   On: 02/06/2018 03:25   Dg Chest Port 1 View  Result Date: 02/06/2018 CLINICAL DATA:  Altered mental status with fever EXAM: PORTABLE CHEST 1 VIEW COMPARISON:  02/05/2018, 01/23/2017 FINDINGS: Left-sided central venous port tip overlies the right atrium. Borderline cardiomegaly. No acute opacity or pleural effusion. No pneumothorax. IMPRESSION: No active disease. Electronically Signed   By: Donavan Foil [name]   On: 02/06/2018 01:50        Scheduled Meds: . calcium-vitamin D  1 tablet Oral Daily  . enoxaparin (LOVENOX) injection  40 mg Subcutaneous Q24H  . gabapentin  300 mg Oral Daily  . multivitamin with minerals  1 tablet Oral Daily  . sertraline  50 mg Oral Daily  . vitamin C  125 mg Oral Daily   Continuous Infusions: . sodium chloride 125 mL/hr at 02/06/18 1345  . [START ON 02/07/2018] cefTRIAXone (ROCEPHIN)  IV    . magnesium sulfate 1 - 4 g bolus IVPB    . potassium chloride 10 mEq (02/06/18 1407)     LOS: 0 days    Time spent: More than 50% of that time was spent in counseling and/or coordination of care.      Shelly Coss, MD [facility] Pager [phone]  If 7PM-7AM, please contact night-coverage [email or web address] Password [email or web address] 02/06/2018, 2:30 PM

## 2018-02-07 DIAGNOSIS — Z79891 Long term (current) use of opiate analgesic: Secondary | ICD-10-CM | POA: Diagnosis not present

## 2018-02-07 DIAGNOSIS — E876 Hypokalemia: Secondary | ICD-10-CM | POA: Diagnosis present

## 2018-02-07 DIAGNOSIS — R401 Stupor: Secondary | ICD-10-CM | POA: Diagnosis not present

## 2018-02-07 DIAGNOSIS — G9341 Metabolic encephalopathy: Secondary | ICD-10-CM | POA: Diagnosis present

## 2018-02-07 DIAGNOSIS — K5900 Constipation, unspecified: Secondary | ICD-10-CM | POA: Diagnosis present

## 2018-02-07 DIAGNOSIS — Z853 Personal history of malignant neoplasm of breast: Secondary | ICD-10-CM | POA: Diagnosis not present

## 2018-02-07 DIAGNOSIS — R4182 Altered mental status, unspecified: Secondary | ICD-10-CM

## 2018-02-07 DIAGNOSIS — R400 Somnolence: Secondary | ICD-10-CM | POA: Diagnosis not present

## 2018-02-07 DIAGNOSIS — G894 Chronic pain syndrome: Secondary | ICD-10-CM | POA: Diagnosis present

## 2018-02-07 DIAGNOSIS — R7881 Bacteremia: Secondary | ICD-10-CM

## 2018-02-07 DIAGNOSIS — N390 Urinary tract infection, site not specified: Secondary | ICD-10-CM | POA: Diagnosis present

## 2018-02-07 DIAGNOSIS — R109 Unspecified abdominal pain: Secondary | ICD-10-CM | POA: Diagnosis not present

## 2018-02-07 DIAGNOSIS — F17200 Nicotine dependence, unspecified, uncomplicated: Secondary | ICD-10-CM | POA: Diagnosis present

## 2018-02-07 DIAGNOSIS — A4151 Sepsis due to Escherichia coli [E. coli]: Secondary | ICD-10-CM | POA: Diagnosis present

## 2018-02-07 DIAGNOSIS — R339 Retention of urine, unspecified: Secondary | ICD-10-CM | POA: Diagnosis not present

## 2018-02-07 DIAGNOSIS — R509 Fever, unspecified: Secondary | ICD-10-CM | POA: Diagnosis not present

## 2018-02-07 DIAGNOSIS — I890 Lymphedema, not elsewhere classified: Secondary | ICD-10-CM | POA: Diagnosis present

## 2018-02-07 DIAGNOSIS — F419 Anxiety disorder, unspecified: Secondary | ICD-10-CM | POA: Diagnosis present

## 2018-02-07 DIAGNOSIS — R05 Cough: Secondary | ICD-10-CM | POA: Diagnosis present

## 2018-02-07 DIAGNOSIS — R918 Other nonspecific abnormal finding of lung field: Secondary | ICD-10-CM | POA: Diagnosis present

## 2018-02-07 DIAGNOSIS — Z170 Estrogen receptor positive status [ER+]: Secondary | ICD-10-CM | POA: Diagnosis not present

## 2018-02-07 DIAGNOSIS — Z86718 Personal history of other venous thrombosis and embolism: Secondary | ICD-10-CM | POA: Diagnosis not present

## 2018-02-07 DIAGNOSIS — D72823 Leukemoid reaction: Secondary | ICD-10-CM | POA: Diagnosis present

## 2018-02-07 DIAGNOSIS — N179 Acute kidney failure, unspecified: Secondary | ICD-10-CM | POA: Diagnosis present

## 2018-02-07 DIAGNOSIS — Z79899 Other long term (current) drug therapy: Secondary | ICD-10-CM | POA: Diagnosis not present

## 2018-02-07 DIAGNOSIS — Z9013 Acquired absence of bilateral breasts and nipples: Secondary | ICD-10-CM | POA: Diagnosis not present

## 2018-02-07 DIAGNOSIS — Z87440 Personal history of urinary (tract) infections: Secondary | ICD-10-CM | POA: Diagnosis not present

## 2018-02-07 DIAGNOSIS — C496 Malignant neoplasm of connective and soft tissue of trunk, unspecified: Secondary | ICD-10-CM | POA: Diagnosis present

## 2018-02-07 DIAGNOSIS — C4911 Malignant neoplasm of connective and soft tissue of right upper limb, including shoulder: Secondary | ICD-10-CM | POA: Diagnosis present

## 2018-02-07 LAB — CBC WITH DIFFERENTIAL/PLATELET
Basophils Absolute: 0 10*3/uL (ref 0.0–0.1)
Basophils Relative: 0 %
Eosinophils Absolute: 0.1 10*3/uL (ref 0.0–0.7)
Eosinophils Relative: 1 %
HCT: 28.1 % — ABNORMAL LOW (ref 36.0–46.0)
Hemoglobin: 9 g/dL — ABNORMAL LOW (ref 12.0–15.0)
Lymphocytes Relative: 11 %
Lymphs Abs: 1 10*3/uL (ref 0.7–4.0)
MCH: 27.1 pg (ref 26.0–34.0)
MCHC: 32 g/dL (ref 30.0–36.0)
MCV: 84.6 fL (ref 78.0–100.0)
Monocytes Absolute: 1.3 10*3/uL — ABNORMAL HIGH (ref 0.1–1.0)
Monocytes Relative: 14 %
Neutro Abs: 6.8 10*3/uL (ref 1.7–7.7)
Neutrophils Relative %: 74 %
Platelets: 187 10*3/uL (ref 150–400)
RBC: 3.32 MIL/uL — ABNORMAL LOW (ref 3.87–5.11)
RDW: 16 % — ABNORMAL HIGH (ref 11.5–15.5)
WBC: 9.1 10*3/uL (ref 4.0–10.5)

## 2018-02-07 LAB — MAGNESIUM: Magnesium: 2 mg/dL (ref 1.7–2.4)

## 2018-02-07 LAB — BASIC METABOLIC PANEL
Anion gap: 4 — ABNORMAL LOW (ref 5–15)
BUN: 12 mg/dL (ref 6–20)
CO2: 26 mmol/L (ref 22–32)
Calcium: 7.7 mg/dL — ABNORMAL LOW (ref 8.9–10.3)
Chloride: 107 mmol/L (ref 101–111)
Creatinine, Ser: 1.02 mg/dL — ABNORMAL HIGH (ref 0.44–1.00)
GFR calc Af Amer: >60 mL/min (ref >60)
GFR calc non Af Amer: 58 mL/min — ABNORMAL LOW (ref >60)
Glucose, Bld: 125 mg/dL — ABNORMAL HIGH (ref 65–99)
Potassium: 3.4 mmol/L — ABNORMAL LOW (ref 3.5–5.1)
Sodium: 137 mmol/L (ref 135–145)

## 2018-02-07 LAB — URINE CULTURE
Culture: <10000 — AB
Special Requests: NORMAL

## 2018-02-07 MED ORDER — POTASSIUM CHLORIDE CRYS ER 20 MEQ PO TBCR
40.0000 meq | EXTENDED_RELEASE_TABLET | Freq: Once | ORAL | Status: AC
  Administered 2018-02-07: 40 meq via ORAL
  Filled 2018-02-07: qty 2

## 2018-02-07 NOTE — Progress Notes (Signed)
Results of urine drug screen reviewed; blood test results pending.  Review of PMP Aware shows patient received methadone from [name] 12/27/2017 and oxycodone/APAP also from [name] 01/17/2018. However neither showed in her urine.  Last benzodiazepine prescription I can find is from Dr [name] Sept 2018.  I have discussed pain issues extensively and repeatedly with [name]. She has a history of "losing" prescriptions (after their being filled) and other irregularities we tried to address with a written contract, w/o much success.  She does have stage 4 cancer and I do believe she has some pain, though currently in the hospiital she is essentially on no pain medicines and does not appear to be in a high level of pain; this of course is not a reliable indicator.  In short: I am not comfortable prescribing drugs with high potential of abuse to this patient, who tells me she is taking percocet but isn't, and that she is not taking cocaine but is, and who may be sharing or selling her medications instead of taking them as prescribed.  I have told her I would start her on methadone 5 mg TID and after 2 weeks if her pain is not well controlled we would increase the dose.  She tells me she gets nauseated from methadone. I have siggested she take compazine with it.  Perhaps a palliative care consult for pain management in this situation would be helpful.  From a cancer point of view, [name] is scheduled for her next visit, labs and treatment 02/21/2018.  Please let me know if I can be of further help at this point.

## 2018-02-07 NOTE — Progress Notes (Signed)
These preliminary result these preliminary results were noted.  Awaiting final report.

## 2018-02-07 NOTE — Progress Notes (Signed)
[facility] Progress Note   [name] [id number] 10-Sep-1956 62 y.o.  [name] is managed by Dr. Jana Hakim  Actively treated with chemotherapy: yes  Current Therapy: Abraxane, pertuzumab, and trastuzumab  Last Treated: 01/24/2018 (cycle 6, day 1)  Assessment: Plan:    Fever, unspecified fever cause - Plan: Urinalysis, Complete w Microscopic, Urine Culture, Culture, Blood, DG Chest 1 View  Leukemoid reaction - Plan: Urinalysis, Complete w Microscopic, Urine Culture, Culture, Blood, DG Chest 1 View  Port catheter in place - Plan: heparin lock flush 100 unit/mL, sodium chloride 0.9 % injection 10 mL  Malignant neoplasm of upper-inner quadrant of right breast in female, estrogen receptor positive ([place]) - Plan: heparin lock flush 100 unit/mL, sodium chloride 0.9 % injection 10 mL  Other chronic pain   Fever with a CBC showing leukemoid reaction: The patient refuses to have blood cultures collected peripherally.  She is agreeable to have one set of blood cultures collected from her port.  She was sent to the bathroom to collect a specimen for a urinalysis and a urine culture.  She returned from the bathroom with a cup that contained a colorless and cold liquid. The urinalysis returned showing a specific gravity of 1.000 with all chemistries negative.  She was referred for a chest x-ray.  A prescription for doxycycline 100 mg p.o. twice daily times 7 days was sent to her pharmacy.  Malignant neoplasm of the upper, inner quadrant of the right breast, ER positive: The patient is status post cycle 6-day 1 of paclitaxel, pertuzumab, and trastuzumab which was dosed on 01/24/2018.  She is scheduled to follow-up with Dr. Jana Hakim on 02/21/2018.  Chronic pain: The patient repeatedly wanted to discuss her medications which were given for pain management.  She is currently on oxycodone and methadone.  She denies that she is in any pain today.  She repeatedly states that she does  not want to be on methadone any longer and only wants to be on oxycodone and Opana.  She continues to express this while at the same time stating that she is having no pain and does not understand why she needs to be on methadone.  I have advised the patient to discuss this with Dr. Jana Hakim at her follow-up.  Please see After Visit Summary for patient specific instructions.  Future Appointments  Date Time Provider [place]  02/21/2018 10:45 AM [facility] LAB 1 [facility] None  02/21/2018 11:00 AM [facility] F21 [facility] None  02/21/2018 11:30 AM Causey, Charlestine Massed, NP [facility] None  02/21/2018 12:30 PM [facility] B6 [facility] None  03/21/2018  9:45 AM [facility] LAB 6 [facility] None  03/21/2018 10:00 AM [facility] FLUSH NURSE 2 [facility] None  03/21/2018 10:30 AM Causey, Charlestine Massed, NP [facility] None  03/21/2018 11:30 AM [facility] G24 [facility] None  04/18/2018 11:00 AM [facility] LAB 1 [facility] None  04/18/2018 11:15 AM [facility] INJ NURSE [facility] None  04/18/2018 12:15 PM [facility] A3 [facility] None  05/16/2018 11:00 AM [facility] LAB 2 [facility] None  05/16/2018 11:15 AM [facility] INJ NURSE [facility] None  05/16/2018 12:15 PM [facility] H30 [facility] None  06/13/2018 11:00 AM [facility] LAB 1 [facility] None  06/13/2018 11:15 AM [facility] INJ NURSE [facility] None  06/13/2018 12:15 PM [facility] E17 [facility] None  07/11/2018 11:00 AM [facility] LAB 2 [facility] None  07/11/2018 11:15 AM [facility] FLUSH NURSE [facility] None  07/11/2018 12:15 PM [facility] H29 [facility] None    Orders Placed This Encounter  Procedures  . Urine Culture  .  Culture, Blood  . DG Chest 1 View  . Urinalysis, Complete w Microscopic       Subjective:   Patient ID:  [name] is a 62 y.o. (DOB April 12, 1956) female.  Chief Complaint:  Chief Complaint  Patient presents with  . Abdominal Pain    HPI [name] is a 62 year old  female with a stage IV ER positive breast cancer, angiosarcoma of the right breast who has most recently received cycle 6 of Abraxane, trastuzumab, and Pertuzumab under the care of Dr. Jana Hakim on 01/24/2018.  She presents to the office today with a report of a sore throat, abdominal pain, hoarseness and chills for the past several days.  She is also had a headache.  She had called EMS yesterday but did not go to the emergency room.  She has multiple complaints including fevers, chills, sweats, nausea, vomiting, diarrhea, and constipation.  She refuses to have blood cultures collected peripherally from her left arm today.  Additionally she did agree to have a urine specimen collected however when she came out of the bathroom she presented a cup that contained a colorless and cold liquid which was water-like in appearance.  The patient states that she is having no pain in her back for which she had been prescribed Percocet, Opana and then was transitioned to methadone.  The patient today states that she no longer believes that she needs methadone and only wants to be prescribed Percocet and Opana.  She asked for these prescription and alluded to getting these prescriptions several times during her appointment today.  Medications: I have reviewed the patient's current medications.  Allergies:  Allergies  Allergen Reactions  . Pork-Derived Products Other (See Comments)    Pt says she just doesn't eat pork; has no reaction to pork products  . Tramadol Nausea Only  . Hydrocodone-Acetaminophen Itching and Rash    Past Medical History:  Diagnosis Date  . Breast cancer ([place])   . DVT (deep venous thrombosis) ([place]) 10/07/2011  . Fever 02/06/2018  . Hypertension   . Radiation 11/29/2005-12/29/2005   right supraclavicular area 4147 cGy  . Radiation 06/26/02-08/14/02   right breast 5040 cGy, tumor bed boosted to 1260 cGy  . Rheumatic fever     Past Surgical History:  Procedure Laterality Date  . BREAST  SURGERY    . MASTECTOMY     bilateral  . PORT A CATH REVISION      Family History  Problem Relation Age of Onset  . Pancreatic cancer Mother   . Kidney cancer Sister 46  . Breast cancer Sister 3  . Breast cancer Other 70    Social History   Socioeconomic History  . Marital status: Single    Spouse name: Not on file  . Number of children: Not on file  . Years of education: Not on file  . Highest education level: Not on file  Occupational History  . Not on file  Social Needs  . Financial resource strain: Not on file  . Food insecurity:    Worry: Not on file    Inability: Not on file  . Transportation needs:    Medical: Not on file    Non-medical: Not on file  Tobacco Use  . Smoking status: Light Tobacco Smoker  . Smokeless tobacco: Never Used  Substance and Sexual Activity  . Alcohol use: Yes    Comment: Rarely  . Drug use: No  . Sexual activity: Yes    Birth control/protection: Post-menopausal  Lifestyle  .  Physical activity:    Days per week: Not on file    Minutes per session: Not on file  . Stress: Not on file  Relationships  . Social connections:    Talks on phone: Not on file    Gets together: Not on file    Attends religious service: Not on file    Active member of club or organization: Not on file    Attends meetings of clubs or organizations: Not on file    Relationship status: Not on file  . Intimate partner violence:    Fear of current or ex partner: Not on file    Emotionally abused: Not on file    Physically abused: Not on file    Forced sexual activity: Not on file  Other Topics Concern  . Not on file  Social History Narrative  . Not on file    Past Medical History, Surgical history, Social history, and Family history were reviewed and updated as appropriate.   Please see review of systems for further details on the patient's review from today.   Review of Systems:  Review of Systems  Constitutional: Positive for chills, diaphoresis  and fever. Negative for appetite change and unexpected weight change.  HENT: Positive for voice change. Negative for postnasal drip, sinus pressure, sinus pain and trouble swallowing.   Respiratory: Negative for cough, chest tightness, shortness of breath and wheezing.   Gastrointestinal: Positive for abdominal pain, constipation, diarrhea, nausea and vomiting. Negative for abdominal distention, anal bleeding, blood in stool and rectal pain.    Objective:   Physical Exam:  BP 120/79 (BP Location: Left Arm, Patient Position: Sitting)   Pulse (!) 101   Temp (!) 101 F (38.3 C) (Oral) Comment: RN Liza aware of temp.  Resp (!) 21   Wt 188 lb 14.4 oz (85.7 kg)   SpO2 100%   BMI 30.49 kg/m  ECOG: 1  Physical Exam  Constitutional: No distress.  The patient is an adult female who is very animated and who exhibits tangential thinking.  HENT:  Head: Normocephalic and atraumatic.  Mouth/Throat: No oropharyngeal exudate.  Neck: Normal range of motion. Neck supple.  Cardiovascular: Normal heart sounds. Tachycardia present. Exam reveals no friction rub.  No murmur heard. Pulmonary/Chest: Effort normal and breath sounds normal. No respiratory distress. She has no wheezes. She has no rales.      Abdominal: Soft. Bowel sounds are normal. She exhibits no distension. There is no tenderness. There is no rebound and no guarding.  Lymphadenopathy:    She has no cervical adenopathy.  Neurological: She is alert. Coordination normal.  Skin: Skin is warm and dry. No rash noted. She is not diaphoretic. No erythema.    Lab Review:     Component Value Date/Time   NA 137 02/07/2018 1120   NA 142 11/09/2017 1214   K 3.4 (L) 02/07/2018 1120   K 3.9 11/09/2017 1214   CL 107 02/07/2018 1120   CL 101 03/07/2013 1411   CO2 26 02/07/2018 1120   CO2 32 (H) 11/09/2017 1214   GLUCOSE 125 (H) 02/07/2018 1120   GLUCOSE 110 11/09/2017 1214   GLUCOSE 92 03/07/2013 1411   BUN 12 02/07/2018 1120   BUN 19.1  11/09/2017 1214   CREATININE 1.02 (H) 02/07/2018 1120   CREATININE 1.10 02/05/2018 1310   CREATININE 1.0 11/09/2017 1214   CALCIUM 7.7 (L) 02/07/2018 1120   CALCIUM 9.1 11/09/2017 1214   PROT 6.5 02/06/2018 0255   PROT 7.6  11/09/2017 1214   ALBUMIN 2.7 (L) 02/06/2018 0255   ALBUMIN 3.5 11/09/2017 1214   AST 43 (H) 02/06/2018 0255   AST 37 (H) 02/05/2018 1310   AST 30 11/09/2017 1214   ALT 20 02/06/2018 0255   ALT 22 02/05/2018 1310   ALT 19 11/09/2017 1214   ALKPHOS 95 02/06/2018 0255   ALKPHOS 120 11/09/2017 1214   BILITOT 1.0 02/06/2018 0255   BILITOT 0.4 02/05/2018 1310   BILITOT <0.22 11/09/2017 1214   GFRNONAA 58 (L) 02/07/2018 1120   GFRNONAA 53 (L) 02/05/2018 1310   GFRAA >60 02/07/2018 1120   GFRAA >60 02/05/2018 1310       Component Value Date/Time   WBC 9.1 02/07/2018 1120   RBC 3.32 (L) 02/07/2018 1120   HGB 9.0 (L) 02/07/2018 1120   HGB 11.3 (L) 11/09/2017 1214   HCT 28.1 (L) 02/07/2018 1120   HCT 35.3 11/09/2017 1214   PLT 187 02/07/2018 1120   PLT 258 02/05/2018 1310   PLT 231 11/09/2017 1214   MCV 84.6 02/07/2018 1120   MCV 83.9 11/09/2017 1214   MCH 27.1 02/07/2018 1120   MCHC 32.0 02/07/2018 1120   RDW 16.0 (H) 02/07/2018 1120   RDW 16.9 (H) 11/09/2017 1214   LYMPHSABS 1.0 02/07/2018 1120   LYMPHSABS 1.5 11/09/2017 1214   MONOABS 1.3 (H) 02/07/2018 1120   MONOABS 0.7 11/09/2017 1214   EOSABS 0.1 02/07/2018 1120   EOSABS 0.3 11/09/2017 1214   BASOSABS 0.0 02/07/2018 1120   BASOSABS 0.1 11/09/2017 1214   -------------------------------  Imaging from last 24 hours (if applicable):  Radiology interpretation: Dg Chest 1 View  Result Date: 02/05/2018 CLINICAL DATA:  Cough fever and leukocytosis. EXAM: CHEST  1 VIEW COMPARISON:  01/23/2017 FINDINGS: Stable position of injectable port. Cardiomediastinal silhouette is normal. Mediastinal contours appear intact. There is no evidence of focal airspace consolidation, pleural effusion or pneumothorax.  Near complete resolution of previously seen lingular airspace consolidation. Right breast/chest wall postsurgical changes. IMPRESSION: Near complete resolution of previously seen lingular airspace consolidation. Electronically Signed   By: Fidela Salisbury [name]   On: 02/05/2018 14:50   Ct Head Wo Contrast  Result Date: 02/06/2018 CLINICAL DATA:  62 year old female with altered mental status. History of breast cancer. EXAM: CT HEAD WITHOUT CONTRAST TECHNIQUE: Contiguous axial images were obtained from the base of the skull through the vertex without intravenous contrast. COMPARISON:  Head CT dated 04/25/2011 FINDINGS: Brain: The ventricles and sulci appropriate size for patient's age. Mild periventricular and deep white matter chronic microvascular ischemic changes noted. There is no acute intracranial hemorrhage. No mass effect or midline shift. There is a 1.0 x 1.5 cm CSF density area in the inferior right temporal lobe which is new compared to the prior CT and likely represents focal area of dilatation of the temporal horn of the right lateral ventricle. An area of encephalomalacia related to an old infarct or an arachnoid cyst are less likely but not excluded. This can be further evaluated with MRI. Vascular: No hyperdense vessel or unexpected calcification. Skull: Normal. Negative for fracture or focal lesion. Sinuses/Orbits: No acute finding. Other: None IMPRESSION: 1. No acute intracranial pathology. 2. Mild chronic microvascular ischemic changes. 3. Small CSF density area in the inferior medial right temporal lobe most likely represents focal dilatation of the temporal horn of the right lateral ventricle and less likely focal encephalomalacia related to prior infarct or a small arachnoid cyst. This can be further evaluated with MRI if clinically indicated.  Electronically Signed   By: Anner Crete [name]   On: 02/06/2018 03:25   Dg Chest Port 1 View  Result Date: 02/06/2018 CLINICAL DATA:  Altered  mental status with fever EXAM: PORTABLE CHEST 1 VIEW COMPARISON:  02/05/2018, 01/23/2017 FINDINGS: Left-sided central venous port tip overlies the right atrium. Borderline cardiomegaly. No acute opacity or pleural effusion. No pneumothorax. IMPRESSION: No active disease. Electronically Signed   By: Donavan Foil [name]   On: 02/06/2018 01:50        This case was discussed with Dr. Jana Hakim. He expressed his agreement with my management of this patient.

## 2018-02-07 NOTE — Progress Notes (Signed)
PROGRESS NOTE    [name]  KXF:[id number] DOB: Feb 13, 1956 DOA: 02/06/2018 PCP: Patient, No Pcp Per   Brief Narrative: Patient is a 62 year old female with past medical history significant for metastatic breast cancer metastases to the back, right upper extremity,possible Sarcoma, chronic pain syndrome, history of recurrent UTI who was brought to the emergency department by her daughter after she was found to be altered. Patient was seen by her oncologist Dr. Jana Hakim at his office.  Blood cultures were also taken at that time.Blood culture reported today to have been growing E. Coli.  She was also reported to have prescribed doxycycline because she was found to be febrile. Patient was admitted for the management of altered mental status and possible sepsis.    Assessment & Plan:   Principal Problem:   Altered mental status Active Problems:   Malignant neoplasm of upper-inner quadrant of right breast in female, estrogen receptor positive (HCC)   Chronic pain   Acute lower UTI   Bacteremia   Polypharmacy  Altered mental status: Resolved .Could be multifactorial.  Could be from polypharmacy or it could be metabolic encephalopathy from UTI and sepsis.  Currently mental status has improved and she is on  her baseline.  She is alert and oriented.  E. coli bacteremia: Blood cultures sent in the oncology office had been reported to be growing E. coli.  Waiting for sensitivity.  Blood cultures have also showing Ecoli.  We will follow the final sensitivity.  Currently she is on ceftriaxone 2 g daily.  Patient is afebrile this morning.  Her blood pressure is stable.  Continue IV fluids.  History of polypharmacy: She has history of chronic pain syndrome patient was on multiple pain medication, and that he medications at home.  She was following with Dr. [name], who is her primary care physician. It was reported that he lost his license because he was over prescribing the pain medications. Dr.  Jana Hakim has put her on Methadone 10 mg BID.  He recommends that if the pain is uncontrolled,we can try methadone 5 mg 3 times daily.But patient doesnot want to take methadone. Patient is obviously pain medication seeking.  History of metastatic right breast cancer/Sarcoma: Follows with Dr. Jana Hakim.She is on trastuzumab and Pertuzumab together with Abraxane given every 4 weeks.  Last treated on 3/20. She had  lesions on her right and back which are suspected to either metastatic breast lesions but also sarcoma lesions from radiation therapy.Dr. Jana Hakim will be following her here.  UTI: Patient reports to have urinary symptoms with dribbling and burning sensation while passing urine.  Urine culture have been sent and it showed less than 10,000 colonies.  Most likely this is the source of E. coli bacteremia.  Continue ceftriaxone.  Anxiety: On Xanax PRN.Also on Zoloft  Hypokalemia/hypomagnesemia: Currently being supplemented.  Will monitor the levels.  Leukocytosis: Normalised.  Mild AKI: Continue IV fluids   DVT prophylaxis: Lovenox Code Status: Full Family Communication: None present at the bedside Disposition Plan: Home after resolution of sepsis, identification of sensitivity   Consultants: Oncology  Procedures: None  Antimicrobials: Ceftriaxone since 02/06/18  Subjective: Patient seen and examined the bedside this morning.  Remains comfortable.  Afebrile.  Blood pressure stable.  Currently alert and oriented.  Objective: Vitals:   02/07/18 0425 02/07/18 0543 02/07/18 0657 02/07/18 0658  BP:  116/64    Pulse:  80    Resp:  16    Temp: 98 F (36.7 C) 98.1 F (36.7 C)  TempSrc: Oral Oral    SpO2:  100% (!) 77% 99%  Weight:      Height:        Intake/Output Summary (Last 24 hours) at 02/07/2018 1334 Last data filed at 02/07/2018 8938 Gross per 24 hour  Intake 2330 ml  Output -  Net 2330 ml   Filed Weights   02/06/18 0126 02/06/18 0747  Weight: 85.3 kg (188 lb)  86.5 kg (190 lb 11.2 oz)    Examination:  General exam: Appears calm and comfortable ,Not in distress,average built HEENT:PERRL,Oral mucosa moist, Ear/Nose normal on gross exam Respiratory system: Bilateral equal air entry, normal vesicular breath sounds, no wheezes or crackles  Cardiovascular system: S1 & S2 heard, RRR. No JVD, murmurs, rubs, gallops or clicks. Gastrointestinal system: Abdomen is nondistended, soft and nontender. No organomegaly or masses felt. Normal bowel sounds heard. Central nervous system: Alert and oriented. No focal neurological deficits. Extremities: No edema, no clubbing ,no cyanosis, distal peripheral pulses palp Edema of the right upper extremity with dark brown/black extensively scattered lesions, similar lesions on the backable. Skin: No rashes, lesions or ulcers,no icterus ,no pallor MSK: Normal muscle bulk,tone ,power Psychiatry: Judgement and insight appear normal. Mood & affect appropriate.      Data Reviewed: I have personally reviewed following labs and imaging studies  CBC: Recent Labs  Lab 02/05/18 1310 02/06/18 0255 02/07/18 1120  WBC 13.5* 13.0* 9.1  NEUTROABS 11.2* 9.8* 6.8  HGB  --  9.8* 9.0*  HCT 32.4* 30.7* 28.1*  MCV 83.3 85.0 84.6  PLT 258 211 101   Basic Metabolic Panel: Recent Labs  Lab 02/05/18 1310 02/06/18 0255 02/06/18 1700 02/07/18 1120  NA 138 140  --  137  K 3.1* 2.8* 3.5 3.4*  CL 100 105  --  107  CO2 29 26  --  26  GLUCOSE 99 109*  --  125*  BUN 17 18  --  12  CREATININE 1.10 1.18*  --  1.02*  CALCIUM 8.8 7.7*  --  7.7*  MG  --  1.4*  --  2.0   GFR: Estimated Creatinine Clearance: 62.1 mL/min (A) (by C-G formula based on SCr of 1.02 mg/dL (H)). Liver Function Tests: Recent Labs  Lab 02/05/18 1310 02/06/18 0255  AST 37* 43*  ALT 22 20  ALKPHOS 118 95  BILITOT 0.4 1.0  PROT 7.3 6.5  ALBUMIN 2.9* 2.7*   No results for input(s): LIPASE, AMYLASE in the last 168 hours. No results for input(s):  AMMONIA in the last 168 hours. Coagulation Profile: No results for input(s): INR, PROTIME in the last 168 hours. Cardiac Enzymes: No results for input(s): CKTOTAL, CKMB, CKMBINDEX, TROPONINI in the last 168 hours. BNP (last 3 results) No results for input(s): PROBNP in the last 8760 hours. HbA1C: No results for input(s): HGBA1C in the last 72 hours. CBG: Recent Labs  Lab 02/06/18 0053  GLUCAP 106*   Lipid Profile: No results for input(s): CHOL, HDL, LDLCALC, TRIG, CHOLHDL, LDLDIRECT in the last 72 hours. Thyroid Function Tests: No results for input(s): TSH, T4TOTAL, FREET4, T3FREE, THYROIDAB in the last 72 hours. Anemia Panel: No results for input(s): VITAMINB12, FOLATE, FERRITIN, TIBC, IRON, RETICCTPCT in the last 72 hours. Sepsis Labs: Recent Labs  Lab 02/06/18 0103 02/06/18 0313  LATICACIDVEN 1.69 0.38*    Recent Results (from the past 240 hour(s))  Culture, Blood     Status: Abnormal (Preliminary result)   Collection Time: 02/05/18  2:22 PM  Result Value Ref  Range Status   Specimen Description BLOOD PORTA CATH  Final   Special Requests   Final    BOTTLES DRAWN AEROBIC AND ANAEROBIC Blood Culture adequate volume   Culture  Setup Time   Final    GRAM NEGATIVE RODS IN BOTH AEROBIC AND ANAEROBIC BOTTLES CRITICAL RESULT CALLED TO, READ BACK BY AND VERIFIED WITH: Sheffield Slider [facility] 02/06/18 0719 JDW    Culture (A)  Final    ESCHERICHIA COLI SUSCEPTIBILITIES TO FOLLOW Performed at [facility], [place] [address] [place], [place] [address]    Report Status PENDING  Incomplete  Blood Culture ID Panel (Reflexed)     Status: Abnormal   Collection Time: 02/05/18  2:22 PM  Result Value Ref Range Status   Enterococcus species NOT DETECTED NOT DETECTED Final   Listeria monocytogenes NOT DETECTED NOT DETECTED Final   Staphylococcus species NOT DETECTED NOT DETECTED Final   Staphylococcus aureus NOT DETECTED NOT DETECTED Final   Streptococcus species NOT DETECTED NOT  DETECTED Final   Streptococcus agalactiae NOT DETECTED NOT DETECTED Final   Streptococcus pneumoniae NOT DETECTED NOT DETECTED Final   Streptococcus pyogenes NOT DETECTED NOT DETECTED Final   Acinetobacter baumannii NOT DETECTED NOT DETECTED Final   Enterobacteriaceae species DETECTED (A) NOT DETECTED Final    Comment: Enterobacteriaceae represent a large family of gram-negative bacteria, not a single organism. CRITICAL RESULT CALLED TO, READ BACK BY AND VERIFIED WITH: Sheffield Slider [facility] 02/06/18 0719 JDW    Enterobacter cloacae complex NOT DETECTED NOT DETECTED Final   Escherichia coli DETECTED (A) NOT DETECTED Final    Comment: CRITICAL RESULT CALLED TO, READ BACK BY AND VERIFIED WITH: Sheffield Slider [facility] 02/06/18 0719 JDW    Klebsiella oxytoca NOT DETECTED NOT DETECTED Final   Klebsiella pneumoniae NOT DETECTED NOT DETECTED Final   Proteus species NOT DETECTED NOT DETECTED Final   Serratia marcescens NOT DETECTED NOT DETECTED Final   Carbapenem resistance NOT DETECTED NOT DETECTED Final   Haemophilus influenzae NOT DETECTED NOT DETECTED Final   Neisseria meningitidis NOT DETECTED NOT DETECTED Final   Pseudomonas aeruginosa NOT DETECTED NOT DETECTED Final   Candida albicans NOT DETECTED NOT DETECTED Final   Candida glabrata NOT DETECTED NOT DETECTED Final   Candida krusei NOT DETECTED NOT DETECTED Final   Candida parapsilosis NOT DETECTED NOT DETECTED Final   Candida tropicalis NOT DETECTED NOT DETECTED Final    Comment: Performed at [facility], [place] [address], [place], [place] [address]  Urine Culture     Status: None   Collection Time: 02/05/18  2:32 PM  Result Value Ref Range Status   Specimen Description   Final    URINE, CLEAN CATCH Performed at [facility], [place] [address], [place], [place] [address]    Special Requests   Final    URINE, CLEAN CATCH Performed at [facility], [place] [address], [place], [place]  [address]    Culture   Final    NO GROWTH Performed at [facility], [place] [address], [place], [place] [address]    Report Status 02/06/2018 FINAL  Final  Blood Culture (routine x 2)     Status: Abnormal (Preliminary result)   Collection Time: 02/06/18 12:55 AM  Result Value Ref Range Status   Specimen Description   Final    BLOOD BLOOD LEFT FOREARM Performed at [place] [address], [place],  [address]    Special Requests  Final    BOTTLES DRAWN AEROBIC AND ANAEROBIC Blood Culture adequate volume Performed at [place] [address], [place], [place] [address]    Culture  Setup Time   Final    GRAM NEGATIVE RODS IN BOTH AEROBIC AND ANAEROBIC BOTTLES Organism ID to follow CRITICAL RESULT CALLED TO, READ BACK BY AND VERIFIED WITH: Melodye Ped PHARMD [id number] 02/06/18 A BROWNING    Culture (A)  Final    ESCHERICHIA COLI SUSCEPTIBILITIES TO FOLLOW Performed at [facility], [place] [address] [place], [place] [address]    Report Status PENDING  Incomplete  Blood Culture ID Panel (Reflexed)     Status: Abnormal   Collection Time: 02/06/18 12:55 AM  Result Value Ref Range Status   Enterococcus species NOT DETECTED NOT DETECTED Final   Listeria monocytogenes NOT DETECTED NOT DETECTED Final   Staphylococcus species NOT DETECTED NOT DETECTED Final   Staphylococcus aureus NOT DETECTED NOT DETECTED Final   Streptococcus species NOT DETECTED NOT DETECTED Final   Streptococcus agalactiae NOT DETECTED NOT DETECTED Final   Streptococcus pneumoniae NOT DETECTED NOT DETECTED Final   Streptococcus pyogenes NOT DETECTED NOT DETECTED Final   Acinetobacter baumannii NOT DETECTED NOT DETECTED Final   Enterobacteriaceae species DETECTED (A) NOT DETECTED Final    Comment: Enterobacteriaceae represent a large family of gram-negative bacteria, not a single organism. CRITICAL RESULT CALLED TO, READ BACK BY AND VERIFIED WITH: Melodye Ped PHARMD  9163 02/06/18 A BROWNING    Enterobacter cloacae complex NOT DETECTED NOT DETECTED Final   Escherichia coli DETECTED (A) NOT DETECTED Final    Comment: CRITICAL RESULT CALLED TO, READ BACK BY AND VERIFIED WITH: Melodye Ped PHARMD 1829 02/06/18 A BROWNING    Klebsiella oxytoca NOT DETECTED NOT DETECTED Final   Klebsiella pneumoniae NOT DETECTED NOT DETECTED Final   Proteus species NOT DETECTED NOT DETECTED Final   Serratia marcescens NOT DETECTED NOT DETECTED Final   Carbapenem resistance NOT DETECTED NOT DETECTED Final   Haemophilus influenzae NOT DETECTED NOT DETECTED Final   Neisseria meningitidis NOT DETECTED NOT DETECTED Final   Pseudomonas aeruginosa NOT DETECTED NOT DETECTED Final   Candida albicans NOT DETECTED NOT DETECTED Final   Candida glabrata NOT DETECTED NOT DETECTED Final   Candida krusei NOT DETECTED NOT DETECTED Final   Candida parapsilosis NOT DETECTED NOT DETECTED Final   Candida tropicalis NOT DETECTED NOT DETECTED Final    Comment: Performed at [facility], [place] [address], [place], [place] [address]  Urine culture     Status: Abnormal   Collection Time: 02/06/18  1:15 AM  Result Value Ref Range Status   Specimen Description   Final    URINE, CATHETERIZED Performed at [place] [address], [place], [place] [address]    Special Requests   Final    Normal Performed at [facility], [place] [address] [place], [place] [address]    Culture (A)  Final    <10,000 COLONIES/mL INSIGNIFICANT GROWTH Performed at [place] [address], [place], [place] [address]    Report Status 02/07/2018 FINAL  Final  Blood Culture (routine x 2)     Status: None (Preliminary result)   Collection Time: 02/06/18  1:20 AM  Result Value Ref Range Status   Specimen Description   Final    BLOOD PORTA CATH Performed at [place] [address] [place], [place] [address]    Special Requests  Final     BOTTLES DRAWN AEROBIC AND ANAEROBIC Blood Culture adequate volume Performed at [place] [address], [place], [place] [address]    Culture  Setup Time   Final    GRAM NEGATIVE RODS IN BOTH AEROBIC AND ANAEROBIC BOTTLES CRITICAL RESULT CALLED TO, READ BACK BY AND VERIFIED WITH: Melodye Ped [facility] 9163 02/06/18 A BROWNING    Culture   Final    GRAM NEGATIVE RODS IDENTIFICATION TO FOLLOW Performed at [facility], [place] [address] [place], [place] [address]    Report Status PENDING  Incomplete         Radiology Studies: Dg Chest 1 View  Result Date: 02/05/2018 CLINICAL DATA:  Cough fever and leukocytosis. EXAM: CHEST  1 VIEW COMPARISON:  01/23/2017 FINDINGS: Stable position of injectable port. Cardiomediastinal silhouette is normal. Mediastinal contours appear intact. There is no evidence of focal airspace consolidation, pleural effusion or pneumothorax. Near complete resolution of previously seen lingular airspace consolidation. Right breast/chest wall postsurgical changes. IMPRESSION: Near complete resolution of previously seen lingular airspace consolidation. Electronically Signed   By: Fidela Salisbury [name]   On: 02/05/2018 14:50   Ct Head Wo Contrast  Result Date: 02/06/2018 CLINICAL DATA:  62 year old female with altered mental status. History of breast cancer. EXAM: CT HEAD WITHOUT CONTRAST TECHNIQUE: Contiguous axial images were obtained from the base of the skull through the vertex without intravenous contrast. COMPARISON:  Head CT dated 04/25/2011 FINDINGS: Brain: The ventricles and sulci appropriate size for patient's age. Mild periventricular and deep white matter chronic microvascular ischemic changes noted. There is no acute intracranial hemorrhage. No mass effect or midline shift. There is a 1.0 x 1.5 cm CSF density area in the inferior right temporal lobe which is new compared to the prior CT and likely represents focal area of dilatation of the  temporal horn of the right lateral ventricle. An area of encephalomalacia related to an old infarct or an arachnoid cyst are less likely but not excluded. This can be further evaluated with MRI. Vascular: No hyperdense vessel or unexpected calcification. Skull: Normal. Negative for fracture or focal lesion. Sinuses/Orbits: No acute finding. Other: None IMPRESSION: 1. No acute intracranial pathology. 2. Mild chronic microvascular ischemic changes. 3. Small CSF density area in the inferior medial right temporal lobe most likely represents focal dilatation of the temporal horn of the right lateral ventricle and less likely focal encephalomalacia related to prior infarct or a small arachnoid cyst. This can be further evaluated with MRI if clinically indicated. Electronically Signed   By: Anner Crete [name]   On: 02/06/2018 03:25   Dg Chest Port 1 View  Result Date: 02/06/2018 CLINICAL DATA:  Altered mental status with fever EXAM: PORTABLE CHEST 1 VIEW COMPARISON:  02/05/2018, 01/23/2017 FINDINGS: Left-sided central venous port tip overlies the right atrium. Borderline cardiomegaly. No acute opacity or pleural effusion. No pneumothorax. IMPRESSION: No active disease. Electronically Signed   By: Donavan Foil [name]   On: 02/06/2018 01:50        Scheduled Meds: . calcium-vitamin D  1 tablet Oral Daily  . enoxaparin (LOVENOX) injection  40 mg Subcutaneous Q24H  . gabapentin  300 mg Oral Daily  . multivitamin with minerals  1 tablet Oral Daily  . sertraline  50 mg Oral Daily  . vitamin C  125 mg Oral Daily   Continuous Infusions: . sodium chloride 125 mL/hr at 02/07/18 0811  . cefTRIAXone (ROCEPHIN)  IV Stopped (02/07/18 9935)  LOS: 0 days    Time spent: More than 50% of that time was spent in counseling and/or coordination of care.      Shelly Coss, MD [facility] Pager [phone]  If 7PM-7AM, please contact night-coverage [email or web address] Password [email or web address] 02/07/2018, 1:34 PM

## 2018-02-08 ENCOUNTER — Telehealth: Payer: Self-pay | Admitting: *Deleted

## 2018-02-08 ENCOUNTER — Other Ambulatory Visit: Payer: Self-pay | Admitting: Oncology

## 2018-02-08 DIAGNOSIS — C50211 Malignant neoplasm of upper-inner quadrant of right female breast: Secondary | ICD-10-CM

## 2018-02-08 DIAGNOSIS — R401 Stupor: Secondary | ICD-10-CM

## 2018-02-08 DIAGNOSIS — Z170 Estrogen receptor positive status [ER+]: Secondary | ICD-10-CM

## 2018-02-08 LAB — CULTURE, BLOOD (ROUTINE X 2)
Special Requests: ADEQUATE
Special Requests: ADEQUATE

## 2018-02-08 LAB — CULTURE, BLOOD (SINGLE): Special Requests: ADEQUATE

## 2018-02-08 MED ORDER — HYDROXYZINE HCL 10 MG PO TABS: 10.0000 mg | ORAL_TABLET | Freq: Three times a day (TID) | ORAL | 0 refills | Status: DC | PRN

## 2018-02-08 MED ORDER — HEPARIN SOD (PORK) LOCK FLUSH 100 UNIT/ML IV SOLN
500.0000 [IU] | Freq: Once | INTRAVENOUS | Status: AC
  Administered 2018-02-08: 500 [IU] via INTRAVENOUS
  Filled 2018-02-08: qty 5

## 2018-02-08 MED ORDER — CEFDINIR 300 MG PO CAPS: 300.0000 mg | ORAL_CAPSULE | Freq: Two times a day (BID) | ORAL | 0 refills | Status: DC

## 2018-02-08 MED ORDER — PROCHLORPERAZINE MALEATE 10 MG PO TABS: 10.0000 mg | ORAL_TABLET | Freq: Four times a day (QID) | ORAL | 0 refills | Status: DC | PRN

## 2018-02-08 MED ORDER — IBUPROFEN 200 MG PO TABS
400.0000 mg | ORAL_TABLET | Freq: Once | ORAL | Status: AC
Start: 2018-02-08 — End: 2018-02-08
  Administered 2018-02-08: 400 mg via ORAL
  Filled 2018-02-08: qty 2

## 2018-02-08 MED ORDER — METHADONE HCL 5 MG PO TABS: 5.0000 mg | ORAL_TABLET | Freq: Three times a day (TID) | ORAL | 0 refills | Status: DC

## 2018-02-08 NOTE — Progress Notes (Signed)
These preliminary result these preliminary results were noted.  Awaiting final report.

## 2018-02-08 NOTE — Progress Notes (Signed)
Sensitivities from the blood culture 04/01 are now available.  Escherichia coli      MIC    AMPICILLIN >=32 RESIST... Resistant    AMPICILLIN/SULBACTAM 16 INTERMED... Intermediate    CEFAZOLIN <=4 SENSITIVE  Sensitive    CEFEPIME <=1 SENSITIVE  Sensitive    CEFTAZIDIME <=1 SENSITIVE  Sensitive    CEFTRIAXONE <=1 SENSITIVE  Sensitive    CIPROFLOXACIN <=0.25 SENS... Sensitive    Extended ESBL NEGATIVE  Sensitive    GENTAMICIN <=1 SENSITIVE  Sensitive    IMIPENEM <=0.25 SENS... Sensitive    PIP/TAZO <=4 SENSITIVE  Sensitive    TRIMETH/SULFA <=20 SENSIT... Sensitive          Patient could be discharged home on either cipro or septra. She will see [name] again 04/17 for visit and treatment.  As her as pain control is concerned I recommend methadone 5 mg po TID (patient has supply) and to use compazine 5 mg for nausea if associated with that medication. I have suggested hydroxyzine for anxiety.  Please let me know if I can be of further help

## 2018-02-08 NOTE — Discharge Summary (Signed)
Physician Discharge Summary  [name] KVQ:[id number] DOB: 03-Oct-1956 DOA: 02/06/2018  PCP: Patient, No Pcp Per  Admit date: 02/06/2018 Discharge date: 02/08/2018  Admitted From: Home Disposition:  Home  Discharge Condition:Stable CODE STATUS:Full Diet recommendation: Heart Healthy  Brief/Interim Summary: Patient is a 62 year old female with past medical history significant for metastatic breast cancer metastases to the back, right upper extremity,possible Sarcoma, chronic pain syndrome, history of recurrent UTI who was brought to the emergency department by her daughter after she was found to be altered. Patient was seen by her oncologist Dr. Jana Hakim at his office. She was also reported to have prescribed doxycycline because she was found to be febrile. Blood cultures  taken at that time were later reported to have been growing E. Coli.  Patient was admitted for the management of altered mental status and possible sepsis.  Repeat blood cultures were sent here.  Both sets of blood cultures sent as an outpatient and during her hospitalization grew E. coli which was sensitive to ceftriaxone.  Patient has history of recurrent UTIs.  Her urine culture sent here showed insignificant growth. Patient is currently alert and not oriented x4.  She is afebrile and she is hemodynamically stable.  Her blood cultures sensitive to have come back so she has been transitioned to oral antibiotic, cefdinir.  She is stable for discharge to home today.  Patient will follow up with Dr. Jana Hakim as an outpatient in the next appointment.  Following problems were addressed during her hospitalization:  Altered mental status: Resolved .Could be multifactorial.  Could be from polypharmacy or it could be metabolic encephalopathy from UTI and sepsis.  Currently mental status has improved and she is on  her baseline.  She is alert and oriented.  E. coli bacteremia: Blood cultures sent in the oncology office had been  reported to be growing E. coli.  She was on ceftriaxone 2 g daily.  Patient is afebrile this morning.  Her blood pressure is stable. Changed antibiotics to oral.  History of polypharmacy: She has history of chronic pain syndrome patient was on multiple pain medication, and that he medications at home.  She was following with Dr. [name], who is her primary care physician. It was reported that he lost his license because he was over prescribing the pain medications. Dr. Jana Hakim had put her on Methadone 10 mg BID.  He has recommended  methadone 5 mg 3 times daily.She has prescription for methadone at home.  Patient is reluctant to take methadone.  History of metastatic right breast cancer/Sarcoma: Follows with Dr. Jana Hakim.She is on trastuzumab and Pertuzumab together with Abraxane given every 4 weeks. Last treated on 3/20. She had  lesions on her right and back which are suspected to either metastatic breast lesions but also sarcoma lesions from radiation therapy.Dr. Jana Hakim was following her here.  UTI: Patient reports to have urinary symptoms with dribbling and burning sensation while passing urine.  Urine culture have been sent and it showed less than 10,000 colonies.   Anxiety: On hydroxyzine.Also on Zoloft  Hypokalemia/hypomagnesemia: Supplemented  Leukocytosis: Normalised.  Mild AKI: Improved.   Discharge Diagnoses:  Principal Problem:   Altered mental status Active Problems:   Malignant neoplasm of upper-inner quadrant of right breast in female, estrogen receptor positive (HCC)   Chronic pain   Acute lower UTI   Bacteremia   Polypharmacy    Discharge Instructions  Discharge Instructions    Diet - low sodium heart healthy   Complete by:  As directed  Discharge instructions   Complete by:  As directed    1) Please take prescribed medications as instructed. 2) Follow up with Dr. Jana Hakim in the next appointment date. 3) Do a CBC and BMP test on your follow-up  appointment.   Increase activity slowly   Complete by:  As directed      Allergies as of 02/08/2018      Reactions   Pork-derived Products Other (See Comments)   Pt says she just doesn't eat pork; has no reaction to pork products   Tramadol Nausea Only   Hydrocodone-acetaminophen Itching, Rash      Medication List    STOP taking these medications   alprazolam 2 MG tablet Commonly known as:  XANAX   oxyCODONE-acetaminophen 10-325 MG tablet Commonly known as:  PERCOCET     TAKE these medications   calcium-vitamin D 500-200 MG-UNIT tablet Commonly known as:  OSCAL WITH D Take 1 tablet by mouth daily.   cefdinir 300 MG capsule Commonly known as:  OMNICEF Take 1 capsule (300 mg total) by mouth 2 (two) times daily. Start taking on:  02/09/2018   gabapentin 300 MG capsule Commonly known as:  NEURONTIN Take 1 capsule (300 mg total) by mouth daily.   hydrOXYzine 10 MG tablet Commonly known as:  ATARAX/VISTARIL Take 1 tablet (10 mg total) by mouth 3 (three) times daily as needed for anxiety.   methadone 5 MG tablet Commonly known as:  DOLOPHINE Take 1 tablet (5 mg total) by mouth every 8 (eight) hours. Note new dosing per pt's regimen for best control What changed:    medication strength  how much to take  when to take this   multivitamin capsule Take 1 capsule by mouth daily.   potassium chloride 10 MEQ CR capsule Commonly known as:  MICRO-K Take 2 capsules (20 mEq total) by mouth daily.   prochlorperazine 10 MG tablet Commonly known as:  COMPAZINE Take 1 tablet (10 mg total) by mouth every 6 (six) hours as needed for nausea or vomiting.   sertraline 50 MG tablet Commonly known as:  ZOLOFT Take 1 tablet (50 mg total) by mouth daily.   triamterene-hydrochlorothiazide 37.5-25 MG capsule Commonly known as:  DYAZIDE Take 1 each (1 capsule total) by mouth every morning.   vitamin C 100 MG tablet Take 100 mg by mouth daily.       Allergies  Allergen  Reactions  . Pork-Derived Products Other (See Comments)    Pt says she just doesn't eat pork; has no reaction to pork products  . Tramadol Nausea Only  . Hydrocodone-Acetaminophen Itching and Rash    Consultations:  Oncology   Procedures/Studies: Dg Chest 1 View  Result Date: 02/05/2018 CLINICAL DATA:  Cough fever and leukocytosis. EXAM: CHEST  1 VIEW COMPARISON:  01/23/2017 FINDINGS: Stable position of injectable port. Cardiomediastinal silhouette is normal. Mediastinal contours appear intact. There is no evidence of focal airspace consolidation, pleural effusion or pneumothorax. Near complete resolution of previously seen lingular airspace consolidation. Right breast/chest wall postsurgical changes. IMPRESSION: Near complete resolution of previously seen lingular airspace consolidation. Electronically Signed   By: Fidela Salisbury [name]   On: 02/05/2018 14:50   Ct Head Wo Contrast  Result Date: 02/06/2018 CLINICAL DATA:  62 year old female with altered mental status. History of breast cancer. EXAM: CT HEAD WITHOUT CONTRAST TECHNIQUE: Contiguous axial images were obtained from the base of the skull through the vertex without intravenous contrast. COMPARISON:  Head CT dated 04/25/2011 FINDINGS: Brain: The ventricles  and sulci appropriate size for patient's age. Mild periventricular and deep white matter chronic microvascular ischemic changes noted. There is no acute intracranial hemorrhage. No mass effect or midline shift. There is a 1.0 x 1.5 cm CSF density area in the inferior right temporal lobe which is new compared to the prior CT and likely represents focal area of dilatation of the temporal horn of the right lateral ventricle. An area of encephalomalacia related to an old infarct or an arachnoid cyst are less likely but not excluded. This can be further evaluated with MRI. Vascular: No hyperdense vessel or unexpected calcification. Skull: Normal. Negative for fracture or focal lesion.  Sinuses/Orbits: No acute finding. Other: None IMPRESSION: 1. No acute intracranial pathology. 2. Mild chronic microvascular ischemic changes. 3. Small CSF density area in the inferior medial right temporal lobe most likely represents focal dilatation of the temporal horn of the right lateral ventricle and less likely focal encephalomalacia related to prior infarct or a small arachnoid cyst. This can be further evaluated with MRI if clinically indicated. Electronically Signed   By: Anner Crete [name]   On: 02/06/2018 03:25   Dg Chest Port 1 View  Result Date: 02/06/2018 CLINICAL DATA:  Altered mental status with fever EXAM: PORTABLE CHEST 1 VIEW COMPARISON:  02/05/2018, 01/23/2017 FINDINGS: Left-sided central venous port tip overlies the right atrium. Borderline cardiomegaly. No acute opacity or pleural effusion. No pneumothorax. IMPRESSION: No active disease. Electronically Signed   By: Donavan Foil [name]   On: 02/06/2018 01:50   (Echo, Carotid, EGD, Colonoscopy, ERCP)    Subjective: Patient seen and examined the bedside this morning.  Remains comfortable.  Hemodynamically stable.  Afebrile.  Stable for discharge home. Discharge Exam: Vitals:   02/08/18 0815 02/08/18 1057  BP:    Pulse:    Resp:    Temp:    SpO2: 98% 98%   Vitals:   02/08/18 0350 02/08/18 0745 02/08/18 0815 02/08/18 1057  BP: 133/69     Pulse: 78     Resp: 14     Temp: 98.9 F (37.2 C)     TempSrc: Oral     SpO2: 99% 98% 98% 98%  Weight:      Height:        General: Pt is alert, awake, not in acute distress Cardiovascular: RRR, S1/S2 +, no rubs, no gallops Respiratory: CTA bilaterally, no wheezing, no rhonchi Abdominal: Soft, NT, ND, bowel sounds + Extremities: no edema, no cyanosis    The results of significant diagnostics from this hospitalization (including imaging, microbiology, ancillary and laboratory) are listed below for reference.     Microbiology: Recent Results (from the past 240 hour(s))   Culture, Blood     Status: Abnormal   Collection Time: 02/05/18  2:22 PM  Result Value Ref Range Status   Specimen Description BLOOD PORTA CATH  Final   Special Requests   Final    BOTTLES DRAWN AEROBIC AND ANAEROBIC Blood Culture adequate volume   Culture  Setup Time   Final    GRAM NEGATIVE RODS IN BOTH AEROBIC AND ANAEROBIC BOTTLES CRITICAL RESULT CALLED TO, READ BACK BY AND VERIFIED WITH: Sheffield Slider [facility] 02/06/18 0719 JDW Performed at [facility], [address], [place], [place] [address]    Culture ESCHERICHIA COLI (A)  Final   Report Status 02/08/2018 FINAL  Final   Organism ID, Bacteria ESCHERICHIA COLI  Final      Susceptibility   Escherichia coli - MIC*  AMPICILLIN >=32 RESISTANT Resistant     CEFAZOLIN <=4 SENSITIVE Sensitive     CEFEPIME <=1 SENSITIVE Sensitive     CEFTAZIDIME <=1 SENSITIVE Sensitive     CEFTRIAXONE <=1 SENSITIVE Sensitive     CIPROFLOXACIN <=0.25 SENSITIVE Sensitive     GENTAMICIN <=1 SENSITIVE Sensitive     IMIPENEM <=0.25 SENSITIVE Sensitive     TRIMETH/SULFA <=20 SENSITIVE Sensitive     AMPICILLIN/SULBACTAM 16 INTERMEDIATE Intermediate     PIP/TAZO <=4 SENSITIVE Sensitive     Extended ESBL NEGATIVE Sensitive     * ESCHERICHIA COLI  Blood Culture ID Panel (Reflexed)     Status: Abnormal   Collection Time: 02/05/18  2:22 PM  Result Value Ref Range Status   Enterococcus species NOT DETECTED NOT DETECTED Final   Listeria monocytogenes NOT DETECTED NOT DETECTED Final   Staphylococcus species NOT DETECTED NOT DETECTED Final   Staphylococcus aureus NOT DETECTED NOT DETECTED Final   Streptococcus species NOT DETECTED NOT DETECTED Final   Streptococcus agalactiae NOT DETECTED NOT DETECTED Final   Streptococcus pneumoniae NOT DETECTED NOT DETECTED Final   Streptococcus pyogenes NOT DETECTED NOT DETECTED Final   Acinetobacter baumannii NOT DETECTED NOT DETECTED Final   Enterobacteriaceae species DETECTED (A) NOT DETECTED Final    Comment:  Enterobacteriaceae represent a large family of gram-negative bacteria, not a single organism. CRITICAL RESULT CALLED TO, READ BACK BY AND VERIFIED WITH: Sheffield Slider [facility] 02/06/18 0719 JDW    Enterobacter cloacae complex NOT DETECTED NOT DETECTED Final   Escherichia coli DETECTED (A) NOT DETECTED Final    Comment: CRITICAL RESULT CALLED TO, READ BACK BY AND VERIFIED WITH: Sheffield Slider [facility] 02/06/18 0719 JDW    Klebsiella oxytoca NOT DETECTED NOT DETECTED Final   Klebsiella pneumoniae NOT DETECTED NOT DETECTED Final   Proteus species NOT DETECTED NOT DETECTED Final   Serratia marcescens NOT DETECTED NOT DETECTED Final   Carbapenem resistance NOT DETECTED NOT DETECTED Final   Haemophilus influenzae NOT DETECTED NOT DETECTED Final   Neisseria meningitidis NOT DETECTED NOT DETECTED Final   Pseudomonas aeruginosa NOT DETECTED NOT DETECTED Final   Candida albicans NOT DETECTED NOT DETECTED Final   Candida glabrata NOT DETECTED NOT DETECTED Final   Candida krusei NOT DETECTED NOT DETECTED Final   Candida parapsilosis NOT DETECTED NOT DETECTED Final   Candida tropicalis NOT DETECTED NOT DETECTED Final    Comment: Performed at [facility], [place] [address] [place], [place] [address]  Urine Culture     Status: None   Collection Time: 02/05/18  2:32 PM  Result Value Ref Range Status   Specimen Description   Final    URINE, CLEAN CATCH Performed at [facility], [place] [address], [place], [place] [address]    Special Requests   Final    URINE, CLEAN CATCH Performed at [facility], [place] [address] [place], [place] [address]    Culture   Final    NO GROWTH Performed at [facility], [place] [address] [place], [place] [address]    Report Status 02/06/2018 FINAL  Final  Blood Culture (routine x 2)     Status: Abnormal   Collection Time: 02/06/18 12:55 AM  Result Value Ref Range Status   Specimen Description   Final    BLOOD BLOOD  LEFT FOREARM Performed at [place] [address], [place], [name] [address]    Special Requests   Final    BOTTLES  DRAWN AEROBIC AND ANAEROBIC Blood Culture adequate volume Performed at [place] [address], [place], [place] [address]    Culture  Setup Time   Final    GRAM NEGATIVE RODS IN BOTH AEROBIC AND ANAEROBIC BOTTLES Organism ID to follow CRITICAL RESULT CALLED TO, READ BACK BY AND VERIFIED WITH: Melodye Ped PHARMD [id number] 02/06/18 A BROWNING    Culture (A)  Final    ESCHERICHIA COLI SUSCEPTIBILITIES PERFORMED ON PREVIOUS CULTURE WITHIN THE LAST 5 DAYS. Performed at [facility], [place] [address] [place], [place] [address]    Report Status 02/08/2018 FINAL  Final  Blood Culture ID Panel (Reflexed)     Status: Abnormal   Collection Time: 02/06/18 12:55 AM  Result Value Ref Range Status   Enterococcus species NOT DETECTED NOT DETECTED Final   Listeria monocytogenes NOT DETECTED NOT DETECTED Final   Staphylococcus species NOT DETECTED NOT DETECTED Final   Staphylococcus aureus NOT DETECTED NOT DETECTED Final   Streptococcus species NOT DETECTED NOT DETECTED Final   Streptococcus agalactiae NOT DETECTED NOT DETECTED Final   Streptococcus pneumoniae NOT DETECTED NOT DETECTED Final   Streptococcus pyogenes NOT DETECTED NOT DETECTED Final   Acinetobacter baumannii NOT DETECTED NOT DETECTED Final   Enterobacteriaceae species DETECTED (A) NOT DETECTED Final    Comment: Enterobacteriaceae represent a large family of gram-negative bacteria, not a single organism. CRITICAL RESULT CALLED TO, READ BACK BY AND VERIFIED WITH: Melodye Ped PHARMD [id number] 02/06/18 A BROWNING    Enterobacter cloacae complex NOT DETECTED NOT DETECTED Final   Escherichia coli DETECTED (A) NOT DETECTED Final    Comment: CRITICAL RESULT CALLED TO, READ BACK BY AND VERIFIED WITH: Melodye Ped PHARMD 1829 02/06/18 A BROWNING    Klebsiella oxytoca NOT DETECTED NOT DETECTED  Final   Klebsiella pneumoniae NOT DETECTED NOT DETECTED Final   Proteus species NOT DETECTED NOT DETECTED Final   Serratia marcescens NOT DETECTED NOT DETECTED Final   Carbapenem resistance NOT DETECTED NOT DETECTED Final   Haemophilus influenzae NOT DETECTED NOT DETECTED Final   Neisseria meningitidis NOT DETECTED NOT DETECTED Final   Pseudomonas aeruginosa NOT DETECTED NOT DETECTED Final   Candida albicans NOT DETECTED NOT DETECTED Final   Candida glabrata NOT DETECTED NOT DETECTED Final   Candida krusei NOT DETECTED NOT DETECTED Final   Candida parapsilosis NOT DETECTED NOT DETECTED Final   Candida tropicalis NOT DETECTED NOT DETECTED Final    Comment: Performed at [facility], [place] [address], [place], [place] [address]  Urine culture     Status: Abnormal   Collection Time: 02/06/18  1:15 AM  Result Value Ref Range Status   Specimen Description   Final    URINE, CATHETERIZED Performed at [place] [address] [place], [place] [address]    Special Requests   Final    Normal Performed at [facility], [place] [address], [place], [place] [address]    Culture (A)  Final    <10,000 COLONIES/mL INSIGNIFICANT GROWTH Performed at [place] [address] [place], [place] [address]    Report Status 02/07/2018 FINAL  Final  Blood Culture (routine x 2)     Status: Abnormal   Collection Time: 02/06/18  1:20 AM  Result Value Ref Range Status   Specimen Description   Final    BLOOD PORTA CATH Performed at [place] [address], [place], [place] [address]    Special Requests  Final    BOTTLES DRAWN AEROBIC AND ANAEROBIC Blood Culture adequate volume Performed at [place] [address] [place], [place] [address]    Culture  Setup Time   Final    GRAM NEGATIVE RODS IN BOTH AEROBIC AND ANAEROBIC BOTTLES CRITICAL RESULT CALLED TO, READ BACK BY AND VERIFIED WITH: Melodye Ped  PHARMD 1829 02/06/18 A BROWNING    Culture (A)  Final    ESCHERICHIA COLI SUSCEPTIBILITIES PERFORMED ON PREVIOUS CULTURE WITHIN THE LAST 5 DAYS. Performed at [facility], [place] [address] [place], [place] [address]    Report Status 02/08/2018 FINAL  Final     Labs: BNP (last 3 results) No results for input(s): BNP in the last 8760 hours. Basic Metabolic Panel: Recent Labs  Lab 02/05/18 1310 02/06/18 0255 02/06/18 1700 02/07/18 1120  NA 138 140  --  137  K 3.1* 2.8* 3.5 3.4*  CL 100 105  --  107  CO2 29 26  --  26  GLUCOSE 99 109*  --  125*  BUN 17 18  --  12  CREATININE 1.10 1.18*  --  1.02*  CALCIUM 8.8 7.7*  --  7.7*  MG  --  1.4*  --  2.0   Liver Function Tests: Recent Labs  Lab 02/05/18 1310 02/06/18 0255  AST 37* 43*  ALT 22 20  ALKPHOS 118 95  BILITOT 0.4 1.0  PROT 7.3 6.5  ALBUMIN 2.9* 2.7*   No results for input(s): LIPASE, AMYLASE in the last 168 hours. No results for input(s): AMMONIA in the last 168 hours. CBC: Recent Labs  Lab 02/05/18 1310 02/06/18 0255 02/07/18 1120  WBC 13.5* 13.0* 9.1  NEUTROABS 11.2* 9.8* 6.8  HGB  --  9.8* 9.0*  HCT 32.4* 30.7* 28.1*  MCV 83.3 85.0 84.6  PLT 258 211 187   Cardiac Enzymes: No results for input(s): CKTOTAL, CKMB, CKMBINDEX, TROPONINI in the last 168 hours. BNP: Invalid input(s): POCBNP CBG: Recent Labs  Lab 02/06/18 0053  GLUCAP 106*   D-Dimer No results for input(s): DDIMER in the last 72 hours. Hgb A1c No results for input(s): HGBA1C in the last 72 hours. Lipid Profile No results for input(s): CHOL, HDL, LDLCALC, TRIG, CHOLHDL, LDLDIRECT in the last 72 hours. Thyroid function studies No results for input(s): TSH, T4TOTAL, T3FREE, THYROIDAB in the last 72 hours.  Invalid input(s): FREET3 Anemia work up No results for input(s): VITAMINB12, FOLATE, FERRITIN, TIBC, IRON, RETICCTPCT in the last 72 hours. Urinalysis    Component Value Date/Time   COLORURINE YELLOW 02/06/2018 0045    APPEARANCEUR CLEAR 02/06/2018 0045   LABSPEC 1.012 02/06/2018 0045   PHURINE 5.0 02/06/2018 0045   GLUCOSEU NEGATIVE 02/06/2018 0045   HGBUR LARGE (A) 02/06/2018 0045   BILIRUBINUR NEGATIVE 02/06/2018 0045   KETONESUR NEGATIVE 02/06/2018 0045   PROTEINUR 100 (A) 02/06/2018 0045   UROBILINOGEN 0.2 12/29/2010 1427   NITRITE NEGATIVE 02/06/2018 0045   LEUKOCYTESUR SMALL (A) 02/06/2018 0045   Sepsis Labs Invalid input(s): PROCALCITONIN,  WBC,  LACTICIDVEN Microbiology Recent Results (from the past 240 hour(s))  Culture, Blood     Status: Abnormal   Collection Time: 02/05/18  2:22 PM  Result Value Ref Range Status   Specimen Description BLOOD PORTA CATH  Final   Special Requests   Final    BOTTLES DRAWN AEROBIC AND ANAEROBIC Blood Culture adequate volume   Culture  Setup Time   Final    GRAM NEGATIVE RODS IN BOTH  AEROBIC AND ANAEROBIC BOTTLES CRITICAL RESULT CALLED TO, READ BACK BY AND VERIFIED WITH: Sheffield Slider [facility] 02/06/18 0719 JDW Performed at [facility], [address], [place], [place] [address]    Culture ESCHERICHIA COLI (A)  Final   Report Status 02/08/2018 FINAL  Final   Organism ID, Bacteria ESCHERICHIA COLI  Final      Susceptibility   Escherichia coli - MIC*    AMPICILLIN >=32 RESISTANT Resistant     CEFAZOLIN <=4 SENSITIVE Sensitive     CEFEPIME <=1 SENSITIVE Sensitive     CEFTAZIDIME <=1 SENSITIVE Sensitive     CEFTRIAXONE <=1 SENSITIVE Sensitive     CIPROFLOXACIN <=0.25 SENSITIVE Sensitive     GENTAMICIN <=1 SENSITIVE Sensitive     IMIPENEM <=0.25 SENSITIVE Sensitive     TRIMETH/SULFA <=20 SENSITIVE Sensitive     AMPICILLIN/SULBACTAM 16 INTERMEDIATE Intermediate     PIP/TAZO <=4 SENSITIVE Sensitive     Extended ESBL NEGATIVE Sensitive     * ESCHERICHIA COLI  Blood Culture ID Panel (Reflexed)     Status: Abnormal   Collection Time: 02/05/18  2:22 PM  Result Value Ref Range Status   Enterococcus species NOT DETECTED NOT DETECTED Final   Listeria  monocytogenes NOT DETECTED NOT DETECTED Final   Staphylococcus species NOT DETECTED NOT DETECTED Final   Staphylococcus aureus NOT DETECTED NOT DETECTED Final   Streptococcus species NOT DETECTED NOT DETECTED Final   Streptococcus agalactiae NOT DETECTED NOT DETECTED Final   Streptococcus pneumoniae NOT DETECTED NOT DETECTED Final   Streptococcus pyogenes NOT DETECTED NOT DETECTED Final   Acinetobacter baumannii NOT DETECTED NOT DETECTED Final   Enterobacteriaceae species DETECTED (A) NOT DETECTED Final    Comment: Enterobacteriaceae represent a large family of gram-negative bacteria, not a single organism. CRITICAL RESULT CALLED TO, READ BACK BY AND VERIFIED WITH: Sheffield Slider [facility] 02/06/18 0719 JDW    Enterobacter cloacae complex NOT DETECTED NOT DETECTED Final   Escherichia coli DETECTED (A) NOT DETECTED Final    Comment: CRITICAL RESULT CALLED TO, READ BACK BY AND VERIFIED WITH: Sheffield Slider [facility] 02/06/18 0719 JDW    Klebsiella oxytoca NOT DETECTED NOT DETECTED Final   Klebsiella pneumoniae NOT DETECTED NOT DETECTED Final   Proteus species NOT DETECTED NOT DETECTED Final   Serratia marcescens NOT DETECTED NOT DETECTED Final   Carbapenem resistance NOT DETECTED NOT DETECTED Final   Haemophilus influenzae NOT DETECTED NOT DETECTED Final   Neisseria meningitidis NOT DETECTED NOT DETECTED Final   Pseudomonas aeruginosa NOT DETECTED NOT DETECTED Final   Candida albicans NOT DETECTED NOT DETECTED Final   Candida glabrata NOT DETECTED NOT DETECTED Final   Candida krusei NOT DETECTED NOT DETECTED Final   Candida parapsilosis NOT DETECTED NOT DETECTED Final   Candida tropicalis NOT DETECTED NOT DETECTED Final    Comment: Performed at [facility], [place] [address], [place], [place] [address]  Urine Culture     Status: None   Collection Time: 02/05/18  2:32 PM  Result Value Ref Range Status   Specimen Description   Final    URINE, CLEAN CATCH Performed at [facility], [place] [address] [place], [place] [address]    Special Requests   Final    URINE, CLEAN CATCH Performed at [facility], [place] [address], [place], [place] [address]    Culture   Final    NO GROWTH Performed at [facility], [place] [address] [place], [place]  [address]    Report Status 02/06/2018 FINAL  Final  Blood Culture (routine x 2)     Status: Abnormal   Collection Time: 02/06/18 12:55 AM  Result Value Ref Range Status   Specimen Description   Final    BLOOD BLOOD LEFT FOREARM Performed at [place] [address] [place], [place] [address]    Special Requests   Final    BOTTLES DRAWN AEROBIC AND ANAEROBIC Blood Culture adequate volume Performed at [place] [address] [address], [place], [place] [address]    Culture  Setup Time   Final    GRAM NEGATIVE RODS IN BOTH AEROBIC AND ANAEROBIC BOTTLES Organism ID to follow CRITICAL RESULT CALLED TO, READ BACK BY AND VERIFIED WITH: Melodye Ped PHARMD [id number] 02/06/18 A BROWNING    Culture (A)  Final    ESCHERICHIA COLI SUSCEPTIBILITIES PERFORMED ON PREVIOUS CULTURE WITHIN THE LAST 5 DAYS. Performed at [facility], [place] [address] [place], [place] [id number]    Report Status 02/08/2018 FINAL  Final  Blood Culture ID Panel (Reflexed)     Status: Abnormal   Collection Time: 02/06/18 12:55 AM  Result Value Ref Range Status   Enterococcus species NOT DETECTED NOT DETECTED Final   Listeria monocytogenes NOT DETECTED NOT DETECTED Final   Staphylococcus species NOT DETECTED NOT DETECTED Final   Staphylococcus aureus NOT DETECTED NOT DETECTED Final   Streptococcus species NOT DETECTED NOT DETECTED Final   Streptococcus agalactiae NOT DETECTED NOT DETECTED Final   Streptococcus pneumoniae NOT DETECTED NOT DETECTED Final   Streptococcus pyogenes NOT DETECTED NOT DETECTED Final   Acinetobacter baumannii NOT DETECTED NOT DETECTED Final   Enterobacteriaceae  species DETECTED (A) NOT DETECTED Final    Comment: Enterobacteriaceae represent a large family of gram-negative bacteria, not a single organism. CRITICAL RESULT CALLED TO, READ BACK BY AND VERIFIED WITH: Melodye Ped PHARMD [id number] 02/06/18 A BROWNING    Enterobacter cloacae complex NOT DETECTED NOT DETECTED Final   Escherichia coli DETECTED (A) NOT DETECTED Final    Comment: CRITICAL RESULT CALLED TO, READ BACK BY AND VERIFIED WITH: Melodye Ped PHARMD 1829 02/06/18 A BROWNING    Klebsiella oxytoca NOT DETECTED NOT DETECTED Final   Klebsiella pneumoniae NOT DETECTED NOT DETECTED Final   Proteus species NOT DETECTED NOT DETECTED Final   Serratia marcescens NOT DETECTED NOT DETECTED Final   Carbapenem resistance NOT DETECTED NOT DETECTED Final   Haemophilus influenzae NOT DETECTED NOT DETECTED Final   Neisseria meningitidis NOT DETECTED NOT DETECTED Final   Pseudomonas aeruginosa NOT DETECTED NOT DETECTED Final   Candida albicans NOT DETECTED NOT DETECTED Final   Candida glabrata NOT DETECTED NOT DETECTED Final   Candida krusei NOT DETECTED NOT DETECTED Final   Candida parapsilosis NOT DETECTED NOT DETECTED Final   Candida tropicalis NOT DETECTED NOT DETECTED Final    Comment: Performed at [facility], [place] [address], [place], [place] [address]  Urine culture     Status: Abnormal   Collection Time: 02/06/18  1:15 AM  Result Value Ref Range Status   Specimen Description   Final    URINE, CATHETERIZED Performed at [facility] [address] [place], [place] [address]    Special Requests   Final    Normal Performed at [facility], [place] [address] [place], [place] [address]    Culture (A)  Final    <10,000 COLONIES/mL INSIGNIFICANT GROWTH Performed at [facility],  [address] [place], [place] [address]    Report Status 02/07/2018 FINAL  Final  Blood Culture (routine x 2)     Status: Abnormal   Collection Time: 02/06/18  1:20 AM   Result Value Ref Range Status   Specimen Description   Final    BLOOD PORTA CATH Performed at [facility] [address] [place], [place] [address]    Special Requests   Final    BOTTLES DRAWN AEROBIC AND ANAEROBIC Blood Culture adequate volume Performed at [place] [address] [place], [place] [address]    Culture  Setup Time   Final    GRAM NEGATIVE RODS IN BOTH AEROBIC AND ANAEROBIC BOTTLES CRITICAL RESULT CALLED TO, READ BACK BY AND VERIFIED WITH: Melodye Ped PHARMD 1829 02/06/18 A BROWNING    Culture (A)  Final    ESCHERICHIA COLI SUSCEPTIBILITIES PERFORMED ON PREVIOUS CULTURE WITHIN THE LAST 5 DAYS. Performed at [facility], [place] [address] [place],  [address]    Report Status 02/08/2018 FINAL  Final     Time coordinating discharge: Over 30 minutes  SIGNED:   Shelly Coss, MD  [facility] 02/08/2018, 11:40 AM Pager [phone]  If 7PM-7AM, please contact night-coverage [email or web address] Password [email or web address]

## 2018-02-09 ENCOUNTER — Telehealth: Payer: Self-pay | Admitting: *Deleted

## 2018-02-09 NOTE — Telephone Encounter (Signed)
This RN spoke with pt today to follow up post d/c.  [name] was very weepy with onset of conversation - stating "I am not doing well"   " I am feeling like I did when I went to the hospital "  " the methadone is making me fat " with patient crying after this statement.  This RN verified pt is taking medications given upon discharge including the [place] - she also states she received prescriptions for the hydroxyzine and compazine but she did not receive a prescription for the 5 mg methadone ( Epic states as written but no print ).  Through the conversation - [name] repeatedly asked " why won't [name] give me oxycodone " , " he promised to me while in the hospital he would let me use it only if needed "  This RN reiterated MD's discretion  to not use opioids such as OxyContin or oxycodone - but use of methadone for chronic pain.  [name] would repeatedly ask or request for this nurse to verify the MD would give her percocet - with this RN informing the pt the nurse cannot make these decisions.  " well I want to take the methadone and percocet and that is what he promised "  During the conversation [name] would make random statements of prior MD's who dispensed narcotics and how well they took care of her and what good doctors they were and "why [name] wouldn't do what they did "  This RN again reiterated prescribing of pain medication is a doctors preference - not based on other MD's practice  or what the patient is requesting.  At one time [name] stated " I don't want to come up there no more if I am not going to be taking care of " This RN informed pt need for ongoing care related to her known cancer and if she is unhappy or feels her care is not appropriate she may switch to another MD - with [name] then changing her mind " I am sorry for what I said ".  Note through out this conversation [name] made several statements relating to " so what are you going to do for me  "  This RN informed pt of her need to be compliment with how the doctor is prescribing her medications- by taking the medications as prescribed we will be able to assess her pain appropriately for best outcome. This RN reminded the patient that she has informed this office that " I am not taking the methadone " then " I'm taking it but only 1 time a day but I don't want to take it " therefore medically it is difficult to dose her pain medications or assess her pain level.  This RN also informed the pt of goal with medication for pain is to use the least amount of medication with least side effects that allows her to have greatest function. Informed her as well that referrals are being made for her to be seen in a doctor who specializes in pain management.  Per end of conversation [name] states she will take the methadone she has on hand which is 10 mg twice a day- she declined coming in to the office to obtain a script of methadone 5 mg to take TID.  No further needs at this time.

## 2018-02-13 ENCOUNTER — Inpatient Hospital Stay (HOSPITAL_BASED_OUTPATIENT_CLINIC_OR_DEPARTMENT_OTHER): Payer: Commercial Managed Care - PPO | Admitting: Medical

## 2018-02-13 ENCOUNTER — Telehealth: Payer: Self-pay

## 2018-02-13 ENCOUNTER — Other Ambulatory Visit: Payer: Self-pay | Admitting: Oncology

## 2018-02-13 ENCOUNTER — Inpatient Hospital Stay: Payer: Commercial Managed Care - PPO | Admitting: Medical

## 2018-02-13 ENCOUNTER — Other Ambulatory Visit: Payer: Self-pay | Admitting: Medical

## 2018-02-13 VITALS — BP 141/53 | HR 51 | Temp 98.3°F | Resp 18 | Ht 65.0 in | Wt 189.6 lb

## 2018-02-13 DIAGNOSIS — R1084 Generalized abdominal pain: Secondary | ICD-10-CM | POA: Diagnosis not present

## 2018-02-13 DIAGNOSIS — C50211 Malignant neoplasm of upper-inner quadrant of right female breast: Secondary | ICD-10-CM

## 2018-02-13 DIAGNOSIS — F1490 Cocaine use, unspecified, uncomplicated: Secondary | ICD-10-CM

## 2018-02-13 DIAGNOSIS — Z9119 Patient's noncompliance with other medical treatment and regimen: Secondary | ICD-10-CM

## 2018-02-13 DIAGNOSIS — Z95828 Presence of other vascular implants and grafts: Secondary | ICD-10-CM | POA: Diagnosis not present

## 2018-02-13 DIAGNOSIS — R000 Tachycardia, unspecified: Secondary | ICD-10-CM | POA: Diagnosis not present

## 2018-02-13 DIAGNOSIS — C778 Secondary and unspecified malignant neoplasm of lymph nodes of multiple regions: Secondary | ICD-10-CM | POA: Diagnosis not present

## 2018-02-13 DIAGNOSIS — C50411 Malignant neoplasm of upper-outer quadrant of right female breast: Secondary | ICD-10-CM | POA: Diagnosis not present

## 2018-02-13 DIAGNOSIS — Z170 Estrogen receptor positive status [ER+]: Secondary | ICD-10-CM

## 2018-02-13 DIAGNOSIS — R109 Unspecified abdominal pain: Secondary | ICD-10-CM | POA: Diagnosis not present

## 2018-02-13 DIAGNOSIS — C792 Secondary malignant neoplasm of skin: Secondary | ICD-10-CM

## 2018-02-13 DIAGNOSIS — C4911 Malignant neoplasm of connective and soft tissue of right upper limb, including shoulder: Secondary | ICD-10-CM

## 2018-02-13 DIAGNOSIS — R197 Diarrhea, unspecified: Secondary | ICD-10-CM | POA: Diagnosis not present

## 2018-02-13 DIAGNOSIS — R112 Nausea with vomiting, unspecified: Secondary | ICD-10-CM | POA: Diagnosis not present

## 2018-02-13 DIAGNOSIS — G8929 Other chronic pain: Secondary | ICD-10-CM | POA: Diagnosis not present

## 2018-02-13 DIAGNOSIS — D72823 Leukemoid reaction: Secondary | ICD-10-CM | POA: Diagnosis not present

## 2018-02-13 DIAGNOSIS — R509 Fever, unspecified: Secondary | ICD-10-CM | POA: Diagnosis not present

## 2018-02-13 DIAGNOSIS — R61 Generalized hyperhidrosis: Secondary | ICD-10-CM | POA: Diagnosis not present

## 2018-02-13 DIAGNOSIS — K5900 Constipation, unspecified: Secondary | ICD-10-CM | POA: Diagnosis not present

## 2018-02-13 LAB — URINALYSIS, COMPLETE (UACMP) WITH MICROSCOPIC
Bacteria, UA: NONE SEEN
Bilirubin Urine: NEGATIVE
Glucose, UA: NEGATIVE mg/dL
Hgb urine dipstick: NEGATIVE
Ketones, ur: NEGATIVE mg/dL
Leukocytes, UA: NEGATIVE
Nitrite: NEGATIVE
Protein, ur: NEGATIVE mg/dL
Specific Gravity, Urine: 1.01 (ref 1.005–1.030)
Squamous Epithelial / LPF: NONE SEEN
pH: 7 (ref 5.0–8.0)

## 2018-02-13 LAB — DRUG PROFILE 799031
BENZODIAZEPINES: POSITIVE — AB
HYDROXYALPRAZOLAM: POSITIVE — AB
NORDIAZEPAM: NEGATIVE
OH-Alprazolam GC/MS Conf: 457 ng/mL
Oxazepam: NEGATIVE

## 2018-02-13 LAB — CMP (CANCER CENTER ONLY)
ALT: 15 U/L (ref 0–55)
AST: 21 U/L (ref 5–34)
Albumin: 2.8 g/dL — ABNORMAL LOW (ref 3.5–5.0)
Alkaline Phosphatase: 102 U/L (ref 40–150)
Anion gap: 8 (ref 3–11)
BUN: 9 mg/dL (ref 7–26)
CO2: 29 mmol/L (ref 22–29)
Calcium: 9.1 mg/dL (ref 8.4–10.4)
Chloride: 107 mmol/L (ref 98–109)
Creatinine: 0.91 mg/dL (ref 0.60–1.10)
GFR, Est AFR Am: >60 mL/min (ref >60)
GFR, Estimated: >60 mL/min (ref >60)
Glucose, Bld: 98 mg/dL (ref 70–140)
Potassium: 3.4 mmol/L — ABNORMAL LOW (ref 3.5–5.1)
Sodium: 144 mmol/L (ref 136–145)
Total Bilirubin: 0.3 mg/dL (ref 0.2–1.2)
Total Protein: 7.2 g/dL (ref 6.4–8.3)

## 2018-02-13 LAB — CBC WITH DIFFERENTIAL (CANCER CENTER ONLY)
Basophils Absolute: 0 10*3/uL (ref 0.0–0.1)
Basophils Relative: 0 %
Eosinophils Absolute: 0 10*3/uL (ref 0.0–0.5)
Eosinophils Relative: 0 %
HCT: 27.7 % — ABNORMAL LOW (ref 34.8–46.6)
Hemoglobin: 9 g/dL — ABNORMAL LOW (ref 11.6–15.9)
Lymphocytes Relative: 15 %
Lymphs Abs: 1.3 10*3/uL (ref 0.9–3.3)
MCH: 26.8 pg (ref 25.1–34.0)
MCHC: 32.5 g/dL (ref 31.5–36.0)
MCV: 82.5 fL (ref 79.5–101.0)
Monocytes Absolute: 0.8 10*3/uL (ref 0.1–0.9)
Monocytes Relative: 9 %
Neutro Abs: 7 10*3/uL — ABNORMAL HIGH (ref 1.5–6.5)
Neutrophils Relative %: 76 %
Platelet Count: 393 10*3/uL (ref 145–400)
RBC: 3.36 MIL/uL — ABNORMAL LOW (ref 3.70–5.45)
RDW: 16.2 % — ABNORMAL HIGH (ref 11.2–14.5)
Smear Review: 2
WBC Count: 9.2 10*3/uL (ref 3.9–10.3)

## 2018-02-13 LAB — METHADONE CONFIRMATION, URINE
Methadone Gc/Ms Conf: 3875 ng/mL
Methadone: POSITIVE — AB

## 2018-02-13 LAB — URINE DRUGS OF ABUSE SCREEN W ALC, ROUTINE (REF LAB)
Amphetamines, Urine: NEGATIVE ng/mL
Barbiturate, Ur: NEGATIVE ng/mL
Cannabinoid Quant, Ur: NEGATIVE ng/mL
Ethanol U, Quan: NEGATIVE %
Opiate Quant, Ur: NEGATIVE ng/mL
Phencyclidine, Ur: NEGATIVE ng/mL
Propoxyphene, Urine: NEGATIVE ng/mL

## 2018-02-13 LAB — COCAINE METABOLITE CONFIRM,UR
Benzoylecgonine GC/MS Conf: 2480 ng/mL
Cocaine Metab Quant, Ur: POSITIVE — AB

## 2018-02-13 MED ORDER — SODIUM CHLORIDE 0.9 % IJ SOLN
10.0000 mL | INTRAMUSCULAR | Status: DC | PRN
  Administered 2018-02-13: 10 mL via INTRAVENOUS
  Filled 2018-02-13: qty 10

## 2018-02-13 MED ORDER — HEPARIN SOD (PORK) LOCK FLUSH 100 UNIT/ML IV SOLN
500.0000 [IU] | Freq: Once | INTRAVENOUS | Status: AC | PRN
  Administered 2018-02-13: 500 [IU] via INTRAVENOUS
  Filled 2018-02-13: qty 5

## 2018-02-13 NOTE — Patient Instructions (Signed)
Implanted Port Home Guide An implanted port is a type of central line that is placed under the skin. Central lines are used to provide IV access when treatment or nutrition needs to be given through a person's veins. Implanted ports are used for long-term IV access. An implanted port may be placed because:  You need IV medicine that would be irritating to the small veins in your hands or arms.  You need long-term IV medicines, such as antibiotics.  You need IV nutrition for a long period.  You need frequent blood draws for lab tests.  You need dialysis.  Implanted ports are usually placed in the chest area, but they can also be placed in the upper arm, the abdomen, or the leg. An implanted port has two main parts:  Reservoir. The reservoir is round and will appear as a small, raised area under your skin. The reservoir is the part where a needle is inserted to give medicines or draw blood.  Catheter. The catheter is a thin, flexible tube that extends from the reservoir. The catheter is placed into a large vein. Medicine that is inserted into the reservoir goes into the catheter and then into the vein.  How will I care for my incision site? Do not get the incision site wet. Bathe or shower as directed by your health care provider. How is my port accessed? Special steps must be taken to access the port:  Before the port is accessed, a numbing cream can be placed on the skin. This helps numb the skin over the port site.  Your health care provider uses a sterile technique to access the port. ? Your health care provider must put on a mask and sterile gloves. ? The skin over your port is cleaned carefully with an antiseptic and allowed to dry. ? The port is gently pinched between sterile gloves, and a needle is inserted into the port.  Only "non-coring" port needles should be used to access the port. Once the port is accessed, a blood return should be checked. This helps ensure that the port  is in the vein and is not clogged.  If your port needs to remain accessed for a constant infusion, a clear (transparent) bandage will be placed over the needle site. The bandage and needle will need to be changed every week, or as directed by your health care provider.  Keep the bandage covering the needle clean and dry. Do not get it wet. Follow your health care provider's instructions on how to take a shower or bath while the port is accessed.  If your port does not need to stay accessed, no bandage is needed over the port.  What is flushing? Flushing helps keep the port from getting clogged. Follow your health care provider's instructions on how and when to flush the port. Ports are usually flushed with saline solution or a medicine called heparin. The need for flushing will depend on how the port is used.  If the port is used for intermittent medicines or blood draws, the port will need to be flushed: ? After medicines have been given. ? After blood has been drawn. ? As part of routine maintenance.  If a constant infusion is running, the port may not need to be flushed.  How long will my port stay implanted? The port can stay in for as long as your health care provider thinks it is needed. When it is time for the port to come out, surgery will be   done to remove it. The procedure is similar to the one performed when the port was put in. When should I seek immediate medical care? When you have an implanted port, you should seek immediate medical care if:  You notice a bad smell coming from the incision site.  You have swelling, redness, or drainage at the incision site.  You have more swelling or pain at the port site or the surrounding area.  You have a fever that is not controlled with medicine.  This information is not intended to replace advice given to you by your health care provider. Make sure you discuss any questions you have with your health care provider. Document  Released: 10/24/2005 Document Revised: 03/31/2016 Document Reviewed: 07/01/2013 [employer] Interactive Patient Education  2017 [employer]  

## 2018-02-13 NOTE — Telephone Encounter (Signed)
Pt left VM regarding needs to be seen, "just got out the hospital".  Returned her call and reminded her of her appt scheduled with Mendel Ryder NP next Wed.  Pt reports she needs to be seen because she has a party for her grandson coming up this weekend and she vomited once this morning and is having abd pain.  Per [name], can check with [name] PA and pt can be seen in Symptom Management today.  Pt said she wants to see [name],, she verbalized she 'has stage 4 cancer and doesn't want to be kicked to the curb'.  Placed pt on hold and notified [name]. Per [name], he can see pt with [name] PA.   Explained to pt that there are no appts solely with [name] today and that being seen in symptom management that [facility] and [name] can converse on your care because they are in the same building.  Pt agreed with coming in to be seen in Symptom management.  Notified [name] PA and scheduling by in basket of appt with [name] PA and lab appt per [name] PA- pt on her way.

## 2018-02-13 NOTE — Telephone Encounter (Addendum)
[name] with [facility] [phone] option [phone], said that they have received notes and that Dr Vira Blanco could see pt for her arm pain and try spinal cord stimulator trial. Notified [name] and if pt agreable then ok to proceed with appt  with Dr Vira Blanco. Attempted to call [name] back, left her a VM.  Will ask her first what next step would be to get pt an appt or if she would like [name] to call pt. [name] called back within minutes of my VM.  She prefers our office to contact pt to see if she would like appt at [facility].  I attempted to reach pt via phone and got her VM, left her a message with our contact info.  Once we hear back from pt and she agrees with appt, then [name] will call her with date/time.

## 2018-02-13 NOTE — Progress Notes (Signed)
These preliminary result these preliminary results were noted.  Awaiting final report.

## 2018-02-14 NOTE — Progress Notes (Addendum)
[facility] Progress Note   [name] [id number] 1956/08/04 62 y.o.  [name] is managed by Dr. Jana Hakim  Actively treated with chemotherapy: yes  Current Therapy: Abraxane, pertuzumab, and trastuzumab  Last Treated: 01/24/2018 (cycle 6, day 1)  Assessment: Plan:    Port catheter in place - Plan: heparin lock flush 100 unit/mL, sodium chloride 0.9 % injection 10 mL  Malignant neoplasm of upper-inner quadrant of right breast in female, estrogen receptor positive ([place]) - Plan: heparin lock flush 100 unit/mL, sodium chloride 0.9 % injection 10 mL  Other chronic pain  Generalized abdominal pain   ER positive malignant neoplasm of the upper outer quadrant of the right breast: The patient continues to be followed by Dr. Jana Hakim in is status post cycle 6, day 1 of Abraxane, pertuzumab, and trastuzumab which was dosed on 01/24/2018.  She is scheduled for follow-up on 02/21/2018.  Chronic pain: The patient was seen today with Dr. Jana Hakim.  He is ordered a drug screen on the patient as well as a urine for methadone.  He is stated to the patient that he will await these results.  If they show that the patient is in fact taking her methadone then he will give consideration to adding a second pain medication.   Generalized abdominal pain: It was determined that the patient is taken doxycycline, with levofloxacin and cefdinir.  I explained to her that it is very likely that some of her stomach discomfort is coming from this combination of medicines.  She was told to stop doxycycline and to finish her levofloxacin and cefdinir.  She expresses agreement with this plan.  Please see After Visit Summary for patient specific instructions.  Future Appointments  Date Time Provider [place]  02/21/2018 10:45 AM [facility] LAB 1 [facility] None  02/21/2018 11:00 AM [facility] J32 DNS [facility] None  02/21/2018 11:30 AM Gardenia Phlegm, NP [facility] None    02/21/2018 12:30 PM [facility] B6 [facility] None  03/21/2018  9:45 AM [facility] LAB 6 [facility] None  03/21/2018 10:00 AM [facility] FLUSH NURSE 2 [facility] None  03/21/2018 10:30 AM Causey, Charlestine Massed, NP [facility] None  03/21/2018 11:30 AM [facility] G24 [facility] None  04/18/2018 11:00 AM [facility] LAB 1 [facility] None  04/18/2018 11:15 AM [facility] INJ NURSE [facility] None  04/18/2018 12:15 PM [facility] A3 [facility] None  05/16/2018 11:00 AM [facility] LAB 2 [facility] None  05/16/2018 11:15 AM [facility] INJ NURSE [facility] None  05/16/2018 12:15 PM [facility] H30 [facility] None  06/13/2018 11:00 AM [facility] LAB 1 [facility] None  06/13/2018 11:15 AM [facility] INJ NURSE [facility] None  06/13/2018 12:15 PM [facility] E17 [facility] None  07/11/2018 11:00 AM [facility] LAB 2 [facility] None  07/11/2018 11:15 AM [facility] FLUSH NURSE [facility] None  07/11/2018 12:15 PM [facility] H29 [facility] None    Orders Placed This Encounter  Procedures  . Miscellaneous [facility] test (send-out)       Subjective:   Patient ID:  [name] is a 62 y.o. (DOB 1956/10/11) female.  Chief Complaint:  Chief Complaint  Patient presents with  . Abdominal Pain    HPI [name] is a 62 year old female with a stage IV ER positive breast cancer, angiosarcoma of the right breast who was last seen in our office on 02/05/2018 when she presented with a sore throat, headache, abdominal pain hoarseness and chills.  Blood cultures were ordered however the patient only agreed to have blood cultures drawn from her port after her labs returned with a CBC  showing a white blood count of 13.5 and ANC of 11.2.  The patient had a temperature of 101.  She was treated empirically with doxycycline 100 mg p.o. twice daily.  Blood cultures were collected from her chest port and returned positive for E. coli.  A urine specimen was collected which returned showing a urine that was  clear and cold.  The specific gravity returned at 1.000 and was consistent with that of water or with gross contamination with water.  The patient ultimately presented to the emergency room on the morning of 02/06/2018 with somnolence.  According to the patient's record she should be taking methadone and Percocet for pain.  She claims that she has been taking these.  She also claimed that she had not been taking any other medications.  Her drug screen returned positive for methadone, cocaine, benzodiazepines of which Xanax returned positive. The patient told Dr. Jana Hakim that she had not taken cocaine prior to her last hospitalization.  Today she admits that she had taken cocaine as someone had been at her house with it and she simply wanted to try to see if it made her feel better.  She claims that I treated her improperly at her last visit.  I reminded her that I had requested 2 sets of blood cultures for which she only allowed one to be drawn and that she submitted a urine sample to me for a urinalysis and urine culture that was colorless, clear, and cold and returned with a specific gravity of 1.000 with all results negative.  I explained to her that those findings were consistent with water.  I also reminded her that I ordered a chest x-ray and prescribed doxycycline while awaiting her culture results.  She states today that she believes that some of her lab results were tampered with by lab staff after they saw that her cocaine urine drug screen returned positive and that she is being discriminated against.  She also expresses today that she believes that she will elect to take her care elsewhere.  She states that she does not understand why her primary care physician who was providing her with narcotics lost his medical license recently.  She also expresses that Dr. Jana Hakim is not taking good care of her.  She then interjects that her husband pays all of her medical bills.    She also accuses me of being  nothing more than a "yes-man".  Medications: I have reviewed the patient's current medications.  Allergies:  Allergies  Allergen Reactions  . Pork-Derived Products Other (See Comments)    Pt says she just doesn't eat pork; has no reaction to pork products  . Tramadol Nausea Only  . Hydrocodone-Acetaminophen Itching and Rash    Past Medical History:  Diagnosis Date  . Breast cancer ([place])   . DVT (deep venous thrombosis) ([place]) 10/07/2011  . Fever 02/06/2018  . Hypertension   . Radiation 11/29/2005-12/29/2005   right supraclavicular area 4147 cGy  . Radiation 06/26/02-08/14/02   right breast 5040 cGy, tumor bed boosted to 1260 cGy  . Rheumatic fever     Past Surgical History:  Procedure Laterality Date  . BREAST SURGERY    . MASTECTOMY     bilateral  . PORT A CATH REVISION      Family History  Problem Relation Age of Onset  . Pancreatic cancer Mother   . Kidney cancer Sister 22  . Breast cancer Sister 23  . Breast cancer Other 33  Social History   Socioeconomic History  . Marital status: Single    Spouse name: Not on file  . Number of children: Not on file  . Years of education: Not on file  . Highest education level: Not on file  Occupational History  . Not on file  Social Needs  . Financial resource strain: Not on file  . Food insecurity:    Worry: Not on file    Inability: Not on file  . Transportation needs:    Medical: Not on file    Non-medical: Not on file  Tobacco Use  . Smoking status: Light Tobacco Smoker  . Smokeless tobacco: Never Used  Substance and Sexual Activity  . Alcohol use: Yes    Comment: Rarely  . Drug use: No  . Sexual activity: Yes    Birth control/protection: Post-menopausal  Lifestyle  . Physical activity:    Days per week: Not on file    Minutes per session: Not on file  . Stress: Not on file  Relationships  . Social connections:    Talks on phone: Not on file    Gets together: Not on file    Attends religious service:  Not on file    Active member of club or organization: Not on file    Attends meetings of clubs or organizations: Not on file    Relationship status: Not on file  . Intimate partner violence:    Fear of current or ex partner: Not on file    Emotionally abused: Not on file    Physically abused: Not on file    Forced sexual activity: Not on file  Other Topics Concern  . Not on file  Social History Narrative  . Not on file    Past Medical History, Surgical history, Social history, and Family history were reviewed and updated as appropriate.   Please see review of systems for further details on the patient's review from today.   Review of Systems:  Review of Systems  Constitutional: Negative for chills, diaphoresis and fever.  Gastrointestinal: Positive for abdominal pain.  Musculoskeletal: Positive for back pain.    Objective:   Physical Exam:  BP (!) 141/53 (Patient Position: Sitting)   Pulse (!) 51   Temp 98.3 F (36.8 C) (Oral)   Resp 18   Ht 5\' 5"  (1.651 m)   Wt 189 lb 9.6 oz (86 kg)   SpO2 100%   BMI 31.55 kg/m  ECOG: 0  Physical Exam  Constitutional: She does not appear ill. No distress.  HENT:  Head: Normocephalic.  Cardiovascular: Normal rate, regular rhythm and normal heart sounds. Exam reveals no gallop and no friction rub.  No murmur heard. Pulmonary/Chest: Effort normal and breath sounds normal. No respiratory distress. She has no wheezes. She has no rhonchi. She has no rales.  Abdominal: Bowel sounds are normal.  Skin: Skin is warm and dry. No erythema.    Lab Review:     Component Value Date/Time   NA 144 02/13/2018 1354   NA 142 11/09/2017 1214   K 3.4 (L) 02/13/2018 1354   K 3.9 11/09/2017 1214   CL 107 02/13/2018 1354   CL 101 03/07/2013 1411   CO2 29 02/13/2018 1354   CO2 32 (H) 11/09/2017 1214   GLUCOSE 98 02/13/2018 1354   GLUCOSE 110 11/09/2017 1214   GLUCOSE 92 03/07/2013 1411   BUN 9 02/13/2018 1354   BUN 19.1 11/09/2017 1214    CREATININE 0.91 02/13/2018 1354  CREATININE 1.0 11/09/2017 1214   CALCIUM 9.1 02/13/2018 1354   CALCIUM 9.1 11/09/2017 1214   PROT 7.2 02/13/2018 1354   PROT 7.6 11/09/2017 1214   ALBUMIN 2.8 (L) 02/13/2018 1354   ALBUMIN 3.5 11/09/2017 1214   AST 21 02/13/2018 1354   AST 30 11/09/2017 1214   ALT 15 02/13/2018 1354   ALT 19 11/09/2017 1214   ALKPHOS 102 02/13/2018 1354   ALKPHOS 120 11/09/2017 1214   BILITOT 0.3 02/13/2018 1354   BILITOT <0.22 11/09/2017 1214   GFRNONAA >60 02/13/2018 1354   GFRAA >60 02/13/2018 1354       Component Value Date/Time   WBC 9.2 02/13/2018 1354   WBC 9.1 02/07/2018 1120   RBC 3.36 (L) 02/13/2018 1354   HGB 9.0 (L) 02/07/2018 1120   HGB 11.3 (L) 11/09/2017 1214   HCT 27.7 (L) 02/13/2018 1354   HCT 35.3 11/09/2017 1214   PLT 393 02/13/2018 1354   PLT 231 11/09/2017 1214   MCV 82.5 02/13/2018 1354   MCV 83.9 11/09/2017 1214   MCH 26.8 02/13/2018 1354   MCHC 32.5 02/13/2018 1354   RDW 16.2 (H) 02/13/2018 1354   RDW 16.9 (H) 11/09/2017 1214   LYMPHSABS 1.3 02/13/2018 1354   LYMPHSABS 1.5 11/09/2017 1214   MONOABS 0.8 02/13/2018 1354   MONOABS 0.7 11/09/2017 1214   EOSABS 0.0 02/13/2018 1354   EOSABS 0.3 11/09/2017 1214   BASOSABS 0.0 02/13/2018 1354   BASOSABS 0.1 11/09/2017 1214   -------------------------------  Imaging from last 24 hours (if applicable):  Radiology interpretation: Dg Chest 1 View  Result Date: 02/05/2018 CLINICAL DATA:  Cough fever and leukocytosis. EXAM: CHEST  1 VIEW COMPARISON:  01/23/2017 FINDINGS: Stable position of injectable port. Cardiomediastinal silhouette is normal. Mediastinal contours appear intact. There is no evidence of focal airspace consolidation, pleural effusion or pneumothorax. Near complete resolution of previously seen lingular airspace consolidation. Right breast/chest wall postsurgical changes. IMPRESSION: Near complete resolution of previously seen lingular airspace consolidation.  Electronically Signed   By: Fidela Salisbury [name]   On: 02/05/2018 14:50   Ct Head Wo Contrast  Result Date: 02/06/2018 CLINICAL DATA:  62 year old female with altered mental status. History of breast cancer. EXAM: CT HEAD WITHOUT CONTRAST TECHNIQUE: Contiguous axial images were obtained from the base of the skull through the vertex without intravenous contrast. COMPARISON:  Head CT dated 04/25/2011 FINDINGS: Brain: The ventricles and sulci appropriate size for patient's age. Mild periventricular and deep white matter chronic microvascular ischemic changes noted. There is no acute intracranial hemorrhage. No mass effect or midline shift. There is a 1.0 x 1.5 cm CSF density area in the inferior right temporal lobe which is new compared to the prior CT and likely represents focal area of dilatation of the temporal horn of the right lateral ventricle. An area of encephalomalacia related to an old infarct or an arachnoid cyst are less likely but not excluded. This can be further evaluated with MRI. Vascular: No hyperdense vessel or unexpected calcification. Skull: Normal. Negative for fracture or focal lesion. Sinuses/Orbits: No acute finding. Other: None IMPRESSION: 1. No acute intracranial pathology. 2. Mild chronic microvascular ischemic changes. 3. Small CSF density area in the inferior medial right temporal lobe most likely represents focal dilatation of the temporal horn of the right lateral ventricle and less likely focal encephalomalacia related to prior infarct or a small arachnoid cyst. This can be further evaluated with MRI if clinically indicated. Electronically Signed   By: Laren Everts.D.  On: 02/06/2018 03:25   Dg Chest Port 1 View  Result Date: 02/06/2018 CLINICAL DATA:  Altered mental status with fever EXAM: PORTABLE CHEST 1 VIEW COMPARISON:  02/05/2018, 01/23/2017 FINDINGS: Left-sided central venous port tip overlies the right atrium. Borderline cardiomegaly. No acute opacity or pleural  effusion. No pneumothorax. IMPRESSION: No active disease. Electronically Signed   By: Donavan Foil [name]   On: 02/06/2018 01:50        This patient was seen with Dr. Jana Hakim with my treatment plan reviewed with him. He expressed agreement with my medical management of this patient.   ADDENDUM: [name] continues to be very demanding.  She certainly has pain and she is has metastatic disease.  On the other hand she does not follow instructions, does not take her pain medication appropriately, is demanding and occasionally abusive, and exhibits multiple behaviors consistent with drug abuse.  She also had cocaine positivity on her recent drug test.  I discussed with [name] that I will not be able to prescribe other pain medicines until she starts taking the methadone on a regular basis.  The only way I can tell whether or not she has done this is to do a urine test and she refused to do this.  Until that happens she will not receive other pain medicine from this office.   The situation is complicated because 1 of my former partners who is no longer here used to give [facility] pain medicine on request and she also had a primary care physician who was giving her pain medication.  He has since lost his license.  I told [name] that once I have laboratory documentation that she is taking the methadone we can discuss breakthrough medications for pain to be taken in addition to the methadone.  I personally saw this patient and performed a substantive portion of this encounter with the listed APP documented above.   Chauncey Cruel, MD Medical Oncology and Hematology [facility] [address] [place], [place] [address] Tel. [phone]    Fax. [phone]

## 2018-02-15 ENCOUNTER — Telehealth: Payer: Self-pay | Admitting: *Deleted

## 2018-02-15 NOTE — Telephone Encounter (Signed)
Late entry per call on 02/14/2018.  This RN spoke to pt per her VM requesting a return call " because I had a brain storm and I surrender and will do what [name] says "  Vaida states she " took the methadone this morning " " I surrender - I am a child of God and so is Dr [name] " " I want [name] to trust me "  This RN informed pt above information would be given to MD.  Today the pt left 2 VM stating the same- but on second VM Gracen stated " I need something to go with this methadone"  This RN reviewed chart for results of recent drug screen with results still in processing.  Call returned - and Charlie states " when I came in [name] wrote me a letter saying in 2 days he would give me Roxi or something "  " when I was on hospice I took roxi something with my methadone "  " I took 10 mg methadone this morning but it makes me sleepy"  This RN informed pt of need to be consistent with dosing of methadone as prescribed due to [facility] flucuating in stating " I took 5 mg twice a day yesterday " then when this RN attempted to verify appropriate prescribed dose as 10 mg bid " yeah I only took 10mg  yesterday "  " My grand son is having a party Saturday and I can't be sleepy for that "  " I take care of my grandson all day and then I crawl in bed by 830 cause I am tired " " I used to be able to stay up late "  This RN informed pt tiredness may be more from caring for a 62 year old with current medical conditions- not the medication.  " I want [name] to take care of me because you know how when you change doctors you might get more sick - but he wrote me a letter stating he would give me something else in 2 days "  Per end of conversation - this RN will inquire with MD per above and return call to pt.  Per MD - need for results of urine drug screen to verify pt is taking methadone .  Per lab - results still pending - unknown when results will be available - they will  call [facility] and inquire.

## 2018-02-16 ENCOUNTER — Telehealth: Payer: Self-pay | Admitting: *Deleted

## 2018-02-16 ENCOUNTER — Other Ambulatory Visit: Payer: Self-pay | Admitting: Oncology

## 2018-02-16 NOTE — Progress Notes (Unsigned)
[facility] Progress Note   [name] [id number] 1956/06/06 62 y.o.  [name] is managed by Dr. Jana Hakim  Actively treated with chemotherapy: yes  Current Therapy: Abraxane, pertuzumab, and trastuzumab  Last Treated: 01/24/2018 (cycle 6, day 1)  Assessment: Plan:    No diagnosis found.   ER positive malignant neoplasm of the upper outer quadrant of the right breast: The patient continues to be followed by Dr. Jana Hakim in is status post cycle 6, day 1 of Abraxane, pertuzumab, and trastuzumab which was dosed on 01/24/2018.  She is scheduled for follow-up on 02/21/2018.  Chronic pain: The patient was seen today with Dr. Jana Hakim.  He is ordered a drug screen on the patient as well as a urine for methadone.  He is stated to the patient that he will await these results.  If they show that the patient is in fact taking her methadone then he will give consideration to adding a second pain medication.   Generalized abdominal pain: It was determined that the patient is taken doxycycline, with levofloxacin and cefdinir.  I explained to her that it is very likely that some of her stomach discomfort is coming from this combination of medicines.  She was told to stop doxycycline and to finish her levofloxacin and cefdinir.  She expresses agreement with this plan.  Please see After Visit Summary for patient specific instructions.  Future Appointments  Date Time Provider [place]  02/21/2018 10:45 AM [facility] LAB 1 [facility] None  02/21/2018 11:00 AM [facility] J32 DNS [facility] None  02/21/2018 11:30 AM Gardenia Phlegm, NP [facility] None  02/21/2018 12:30 PM [facility] B6 [facility] None  03/21/2018  9:45 AM [facility] LAB 6 [facility] None  03/21/2018 10:00 AM [facility] FLUSH NURSE 2 [facility] None  03/21/2018 10:30 AM Causey, Charlestine Massed, NP [facility] None  03/21/2018 11:30 AM [facility] G24 [facility] None  04/18/2018 11:00 AM  [facility] LAB 1 [facility] None  04/18/2018 11:15 AM [facility] INJ NURSE [facility] None  04/18/2018 12:15 PM [facility] A3 [facility] None  05/16/2018 11:00 AM [facility] LAB 2 [facility] None  05/16/2018 11:15 AM [facility] INJ NURSE [facility] None  05/16/2018 12:15 PM [facility] H30 [facility] None  06/13/2018 11:00 AM [facility] LAB 1 [facility] None  06/13/2018 11:15 AM [facility] INJ NURSE [facility] None  06/13/2018 12:15 PM [facility] E17 [facility] None  07/11/2018 11:00 AM [facility] LAB 2 [facility] None  07/11/2018 11:15 AM [facility] FLUSH NURSE [facility] None  07/11/2018 12:15 PM [facility] H29 [facility] None    No orders of the defined types were placed in this encounter.      Subjective:   Patient ID:  [name] is a 62 y.o. (DOB 01-16-1956) female.  Chief Complaint:  No chief complaint on file.   HPI [name] is a 62 year old female with a stage IV ER positive breast cancer, angiosarcoma of the right breast who was last seen in our office on 02/05/2018 when she presented with a sore throat, headache, abdominal pain hoarseness and chills.  Blood cultures were ordered however the patient only agreed to have blood cultures drawn from her port after her labs returned with a CBC showing a white blood count of 13.5 and ANC of 11.2.  The patient had a temperature of 101.  She was treated empirically with doxycycline 100 mg p.o. twice daily.  Blood cultures were collected from her chest port and returned positive for E. coli.  A urine specimen was collected which returned showing a urine that was clear and  cold.  The specific gravity returned at 1.000 and was consistent with that of water or with gross contamination with water.  The patient ultimately presented to the emergency room on the morning of 02/06/2018 with somnolence.  According to the patient's record she should be taking methadone and Percocet for pain.  She claims that she has been taking these.   She also claimed that she had not been taking any other medications.  Her drug screen returned positive for methadone, cocaine, benzodiazepines of which Xanax returned positive. The patient told Dr. Jana Hakim that she had not taken cocaine prior to her last hospitalization.  Today she admits that she had taken cocaine as someone had been at her house with it and she simply wanted to try to see if it made her feel better.  She claims that I treated her improperly at her last visit.  I reminded her that I had requested 2 sets of blood cultures for which she only allowed one to be drawn and that she submitted a urine sample to me for a urinalysis and urine culture that was colorless, clear, and cold and returned with a specific gravity of 1.000 with all results negative.  I explained to her that those findings were consistent with water.  I also reminded her that I ordered a chest x-ray and prescribed doxycycline while awaiting her culture results.  She states today that she believes that some of her lab results were tampered with by lab staff after they saw that her cocaine urine drug screen returned positive and that she is being discriminated against.  She also expresses today that she believes that she will elect to take her care elsewhere.  She states that she does not understand why her primary care physician who was providing her with narcotics lost his medical license recently.  She also expresses that Dr. Jana Hakim is not taking good care of her.  She then interjects that her husband pays all of her medical bills.    She also accuses me of being nothing more than a "yes-man".  Medications: I have reviewed the patient's current medications.  Allergies:  Allergies  Allergen Reactions  . Pork-Derived Products Other (See Comments)    Pt says she just doesn't eat pork; has no reaction to pork products  . Tramadol Nausea Only  . Hydrocodone-Acetaminophen Itching and Rash    Past Medical History:    Diagnosis Date  . Breast cancer ([place])   . DVT (deep venous thrombosis) ([place]) 10/07/2011  . Fever 02/06/2018  . Hypertension   . Radiation 11/29/2005-12/29/2005   right supraclavicular area 4147 cGy  . Radiation 06/26/02-08/14/02   right breast 5040 cGy, tumor bed boosted to 1260 cGy  . Rheumatic fever     Past Surgical History:  Procedure Laterality Date  . BREAST SURGERY    . MASTECTOMY     bilateral  . PORT A CATH REVISION      Family History  Problem Relation Age of Onset  . Pancreatic cancer Mother   . Kidney cancer Sister 12  . Breast cancer Sister 104  . Breast cancer Other 47    Social History   Socioeconomic History  . Marital status: Single    Spouse name: Not on file  . Number of children: Not on file  . Years of education: Not on file  . Highest education level: Not on file  Occupational History  . Not on file  Social Needs  . Financial resource strain: Not  on file  . Food insecurity:    Worry: Not on file    Inability: Not on file  . Transportation needs:    Medical: Not on file    Non-medical: Not on file  Tobacco Use  . Smoking status: Light Tobacco Smoker  . Smokeless tobacco: Never Used  Substance and Sexual Activity  . Alcohol use: Yes    Comment: Rarely  . Drug use: No  . Sexual activity: Yes    Birth control/protection: Post-menopausal  Lifestyle  . Physical activity:    Days per week: Not on file    Minutes per session: Not on file  . Stress: Not on file  Relationships  . Social connections:    Talks on phone: Not on file    Gets together: Not on file    Attends religious service: Not on file    Active member of club or organization: Not on file    Attends meetings of clubs or organizations: Not on file    Relationship status: Not on file  . Intimate partner violence:    Fear of current or ex partner: Not on file    Emotionally abused: Not on file    Physically abused: Not on file    Forced sexual activity: Not on file  Other  Topics Concern  . Not on file  Social History Narrative  . Not on file    Past Medical History, Surgical history, Social history, and Family history were reviewed and updated as appropriate.   Please see review of systems for further details on the patient's review from today.   Review of Systems:  Review of Systems  Constitutional: Negative for chills, diaphoresis and fever.  Gastrointestinal: Positive for abdominal pain.  Musculoskeletal: Positive for back pain.    Objective:   Physical Exam:  There were no vitals taken for this visit. ECOG: 0  Physical Exam  Constitutional: She does not appear ill. No distress.  HENT:  Head: Normocephalic.  Cardiovascular: Normal rate, regular rhythm and normal heart sounds. Exam reveals no gallop and no friction rub.  No murmur heard. Pulmonary/Chest: Effort normal and breath sounds normal. No respiratory distress. She has no wheezes. She has no rhonchi. She has no rales.  Abdominal: Bowel sounds are normal.  Skin: Skin is warm and dry. No erythema.    Lab Review:     Component Value Date/Time   NA 144 02/13/2018 1354   NA 142 11/09/2017 1214   K 3.4 (L) 02/13/2018 1354   K 3.9 11/09/2017 1214   CL 107 02/13/2018 1354   CL 101 03/07/2013 1411   CO2 29 02/13/2018 1354   CO2 32 (H) 11/09/2017 1214   GLUCOSE 98 02/13/2018 1354   GLUCOSE 110 11/09/2017 1214   GLUCOSE 92 03/07/2013 1411   BUN 9 02/13/2018 1354   BUN 19.1 11/09/2017 1214   CREATININE 0.91 02/13/2018 1354   CREATININE 1.0 11/09/2017 1214   CALCIUM 9.1 02/13/2018 1354   CALCIUM 9.1 11/09/2017 1214   PROT 7.2 02/13/2018 1354   PROT 7.6 11/09/2017 1214   ALBUMIN 2.8 (L) 02/13/2018 1354   ALBUMIN 3.5 11/09/2017 1214   AST 21 02/13/2018 1354   AST 30 11/09/2017 1214   ALT 15 02/13/2018 1354   ALT 19 11/09/2017 1214   ALKPHOS 102 02/13/2018 1354   ALKPHOS 120 11/09/2017 1214   BILITOT 0.3 02/13/2018 1354   BILITOT <0.22 11/09/2017 1214   GFRNONAA >60  02/13/2018 1354   GFRAA >60 02/13/2018  1354       Component Value Date/Time   WBC 9.2 02/13/2018 1354   WBC 9.1 02/07/2018 1120   RBC 3.36 (L) 02/13/2018 1354   HGB 9.0 (L) 02/07/2018 1120   HGB 11.3 (L) 11/09/2017 1214   HCT 27.7 (L) 02/13/2018 1354   HCT 35.3 11/09/2017 1214   PLT 393 02/13/2018 1354   PLT 231 11/09/2017 1214   MCV 82.5 02/13/2018 1354   MCV 83.9 11/09/2017 1214   MCH 26.8 02/13/2018 1354   MCHC 32.5 02/13/2018 1354   RDW 16.2 (H) 02/13/2018 1354   RDW 16.9 (H) 11/09/2017 1214   LYMPHSABS 1.3 02/13/2018 1354   LYMPHSABS 1.5 11/09/2017 1214   MONOABS 0.8 02/13/2018 1354   MONOABS 0.7 11/09/2017 1214   EOSABS 0.0 02/13/2018 1354   EOSABS 0.3 11/09/2017 1214   BASOSABS 0.0 02/13/2018 1354   BASOSABS 0.1 11/09/2017 1214   -------------------------------  Imaging from last 24 hours (if applicable):  Radiology interpretation: Dg Chest 1 View  Result Date: 02/05/2018 CLINICAL DATA:  Cough fever and leukocytosis. EXAM: CHEST  1 VIEW COMPARISON:  01/23/2017 FINDINGS: Stable position of injectable port. Cardiomediastinal silhouette is normal. Mediastinal contours appear intact. There is no evidence of focal airspace consolidation, pleural effusion or pneumothorax. Near complete resolution of previously seen lingular airspace consolidation. Right breast/chest wall postsurgical changes. IMPRESSION: Near complete resolution of previously seen lingular airspace consolidation. Electronically Signed   By: Fidela Salisbury [name]   On: 02/05/2018 14:50   Ct Head Wo Contrast  Result Date: 02/06/2018 CLINICAL DATA:  62 year old female with altered mental status. History of breast cancer. EXAM: CT HEAD WITHOUT CONTRAST TECHNIQUE: Contiguous axial images were obtained from the base of the skull through the vertex without intravenous contrast. COMPARISON:  Head CT dated 04/25/2011 FINDINGS: Brain: The ventricles and sulci appropriate size for patient's age. Mild periventricular  and deep white matter chronic microvascular ischemic changes noted. There is no acute intracranial hemorrhage. No mass effect or midline shift. There is a 1.0 x 1.5 cm CSF density area in the inferior right temporal lobe which is new compared to the prior CT and likely represents focal area of dilatation of the temporal horn of the right lateral ventricle. An area of encephalomalacia related to an old infarct or an arachnoid cyst are less likely but not excluded. This can be further evaluated with MRI. Vascular: No hyperdense vessel or unexpected calcification. Skull: Normal. Negative for fracture or focal lesion. Sinuses/Orbits: No acute finding. Other: None IMPRESSION: 1. No acute intracranial pathology. 2. Mild chronic microvascular ischemic changes. 3. Small CSF density area in the inferior medial right temporal lobe most likely represents focal dilatation of the temporal horn of the right lateral ventricle and less likely focal encephalomalacia related to prior infarct or a small arachnoid cyst. This can be further evaluated with MRI if clinically indicated. Electronically Signed   By: Anner Crete [name]   On: 02/06/2018 03:25   Dg Chest Port 1 View  Result Date: 02/06/2018 CLINICAL DATA:  Altered mental status with fever EXAM: PORTABLE CHEST 1 VIEW COMPARISON:  02/05/2018, 01/23/2017 FINDINGS: Left-sided central venous port tip overlies the right atrium. Borderline cardiomegaly. No acute opacity or pleural effusion. No pneumothorax. IMPRESSION: No active disease. Electronically Signed   By: Donavan Foil [name]   On: 02/06/2018 01:50        This patient was seen with Dr. Jana Hakim with my treatment plan reviewed with him. He expressed agreement with my medical management of  this patient.

## 2018-02-16 NOTE — Telephone Encounter (Signed)
Voicemail: "Have Dr. [name] nurse call me."

## 2018-02-17 LAB — DRUG SCREEN 10 W/CONF, SERUM
Amphetamines, IA: NEGATIVE ng/mL
Barbiturates, IA: NEGATIVE ug/mL
Cocaine & Metabolite, IA: NEGATIVE ng/mL
Phencyclidine, IA: NEGATIVE ng/mL
Propoxyphene, IA: NEGATIVE ng/mL
THC(Marijuana) Metabolite, IA: NEGATIVE ng/mL

## 2018-02-17 LAB — MISC LABCORP TEST (SEND OUT): Labcorp test code: 74468

## 2018-02-20 ENCOUNTER — Telehealth: Payer: Self-pay | Admitting: *Deleted

## 2018-02-20 LAB — OPIATES,MS,WB/SP RFX: Opiate Confirmation: UNDETERMINED

## 2018-02-20 LAB — BENZODIAZEPINES,MS,WB/SP RFX: Benzodiazepines Confirm: UNDETERMINED

## 2018-02-20 LAB — METHADONE,MS,WB/SP RFX: Methadone Confirmation: UNDETERMINED

## 2018-02-20 LAB — OXYCODONES,MS,WB/SP RFX: Oxycodones Confirmation: UNDETERMINED

## 2018-02-20 NOTE — Telephone Encounter (Signed)
"  When is my next appointment?  I was just discharged and do not want chemotherapy.  I do not feel strong enough yet.  I have not seen Dr. Jana Hakim in a long time so I want to make sure I see him as well. "   Instructed to arrive at 10:15 for registration process.

## 2018-02-21 ENCOUNTER — Other Ambulatory Visit: Payer: Commercial Managed Care - PPO

## 2018-02-21 ENCOUNTER — Encounter: Payer: Self-pay | Admitting: Adult Health

## 2018-02-21 ENCOUNTER — Inpatient Hospital Stay: Payer: Commercial Managed Care - PPO

## 2018-02-21 ENCOUNTER — Inpatient Hospital Stay (HOSPITAL_BASED_OUTPATIENT_CLINIC_OR_DEPARTMENT_OTHER): Payer: Commercial Managed Care - PPO | Admitting: Adult Health

## 2018-02-21 ENCOUNTER — Other Ambulatory Visit: Payer: Self-pay | Admitting: Oncology

## 2018-02-21 VITALS — BP 130/81 | HR 66 | Temp 98.7°F | Resp 18 | Ht 65.0 in | Wt 186.1 lb

## 2018-02-21 DIAGNOSIS — C50211 Malignant neoplasm of upper-inner quadrant of right female breast: Secondary | ICD-10-CM

## 2018-02-21 DIAGNOSIS — G893 Neoplasm related pain (acute) (chronic): Secondary | ICD-10-CM

## 2018-02-21 DIAGNOSIS — Z171 Estrogen receptor negative status [ER-]: Secondary | ICD-10-CM

## 2018-02-21 DIAGNOSIS — Z9013 Acquired absence of bilateral breasts and nipples: Secondary | ICD-10-CM

## 2018-02-21 DIAGNOSIS — C4911 Malignant neoplasm of connective and soft tissue of right upper limb, including shoulder: Secondary | ICD-10-CM

## 2018-02-21 DIAGNOSIS — C792 Secondary malignant neoplasm of skin: Secondary | ICD-10-CM

## 2018-02-21 DIAGNOSIS — Z170 Estrogen receptor positive status [ER+]: Secondary | ICD-10-CM

## 2018-02-21 DIAGNOSIS — G8929 Other chronic pain: Secondary | ICD-10-CM | POA: Diagnosis not present

## 2018-02-21 DIAGNOSIS — R61 Generalized hyperhidrosis: Secondary | ICD-10-CM | POA: Diagnosis not present

## 2018-02-21 DIAGNOSIS — R509 Fever, unspecified: Secondary | ICD-10-CM | POA: Diagnosis not present

## 2018-02-21 DIAGNOSIS — R109 Unspecified abdominal pain: Secondary | ICD-10-CM | POA: Diagnosis not present

## 2018-02-21 DIAGNOSIS — D72823 Leukemoid reaction: Secondary | ICD-10-CM | POA: Diagnosis not present

## 2018-02-21 DIAGNOSIS — C50411 Malignant neoplasm of upper-outer quadrant of right female breast: Secondary | ICD-10-CM | POA: Diagnosis not present

## 2018-02-21 LAB — COMPREHENSIVE METABOLIC PANEL
ALT: 9 U/L (ref 0–55)
AST: 15 U/L (ref 5–34)
Albumin: 3.3 g/dL — ABNORMAL LOW (ref 3.5–5.0)
Alkaline Phosphatase: 108 U/L (ref 40–150)
Anion gap: 6 (ref 3–11)
BUN: 12 mg/dL (ref 7–26)
CO2: 30 mmol/L — ABNORMAL HIGH (ref 22–29)
Calcium: 9.6 mg/dL (ref 8.4–10.4)
Chloride: 105 mmol/L (ref 98–109)
Creatinine, Ser: 0.85 mg/dL (ref 0.60–1.10)
GFR calc Af Amer: >60 mL/min (ref >60)
GFR calc non Af Amer: >60 mL/min (ref >60)
Glucose, Bld: 91 mg/dL (ref 70–140)
Potassium: 4.2 mmol/L (ref 3.5–5.1)
Sodium: 141 mmol/L (ref 136–145)
Total Bilirubin: 0.2 mg/dL (ref 0.2–1.2)
Total Protein: 7.8 g/dL (ref 6.4–8.3)

## 2018-02-21 LAB — CBC WITH DIFFERENTIAL/PLATELET
Basophils Absolute: 0 10*3/uL (ref 0.0–0.1)
Basophils Relative: 1 %
Eosinophils Absolute: 0.2 10*3/uL (ref 0.0–0.5)
Eosinophils Relative: 2 %
HCT: 34.2 % — ABNORMAL LOW (ref 34.8–46.6)
Hemoglobin: 10.4 g/dL — ABNORMAL LOW (ref 11.6–15.9)
Lymphocytes Relative: 21 %
Lymphs Abs: 1.4 10*3/uL (ref 0.9–3.3)
MCH: 26.7 pg (ref 25.1–34.0)
MCHC: 30.4 g/dL — ABNORMAL LOW (ref 31.5–36.0)
MCV: 87.9 fL (ref 79.5–101.0)
Monocytes Absolute: 0.5 10*3/uL (ref 0.1–0.9)
Monocytes Relative: 8 %
Neutro Abs: 4.7 10*3/uL (ref 1.5–6.5)
Neutrophils Relative %: 68 %
Platelets: 286 10*3/uL (ref 145–400)
RBC: 3.89 MIL/uL (ref 3.70–5.45)
RDW: 16.9 % — ABNORMAL HIGH (ref 11.2–14.5)
WBC: 6.8 10*3/uL (ref 3.9–10.3)

## 2018-02-21 MED ORDER — OXYCODONE-ACETAMINOPHEN 10-325 MG PO TABS: 1.0000 | ORAL_TABLET | Freq: Three times a day (TID) | ORAL | 0 refills | Status: DC | PRN

## 2018-02-21 MED ORDER — METHADONE HCL 10 MG PO TABS: 10.0000 mg | ORAL_TABLET | Freq: Two times a day (BID) | ORAL | 0 refills | Status: DC

## 2018-02-21 NOTE — Progress Notes (Signed)
[place]  [phone]) [phone] [phone]     ID: [name]   DOB: 1956/08/26  MR#: [id number]  BOF#:[id number]  Patient Care Team: Patient, No Pcp Per as PCP - General (General Practice) [name], [name], MD as Consulting Physician (Oncology) [name], MD as Consulting Physician (Radiation Oncology) Stark Klein, MD as Consulting Physician (General Surgery) [name], [name], MD as Consulting Physician (Cardiology) [name], [name] 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  She sought attention and had a mammogram which showed an obvious carcinoma in the upper inner quadrant of the right breast, approximately 2 cm.  There were some enlarged lymph nodes in the axilla and an FNA done showed those consistent with malignant cells, most likely an invasive ductal carcinoma.  At that point she was unsure of what to do and was referred by Dr. [name] for a discussion of her treatment options.  By biopsy, it was ER/PR negative and HER-2 was 3+.  DNA index was 1.42.  We reiterated that it was most important to have her disease surgically addressed at that point and had recommended lumpectomy and axillary nodes to be addressed with which she followed through and on 04-03-02, Dr. [name] performed a right partial mastectomy and a right axillary lymph node dissection.  Final pathology revealed a 2.4 cm. high grade, Grade III invasive ductal carcinoma with an adjacent .8 cm. also high grade invasive ductal carcinoma which was felt to represent an intramammary metastasis rather than a second primary.  A smaller mass was just medial to the larger  mass.  The smaller mass was associated with high grade DCIS component.  There was no definite lymphovascular invasion identified.  However, one of fourteen axillary lymph nodes did contain a 1.5 cm. metastatic deposit.  Postoperatively she did very well.  We reiterated the fact that she did need adjuvant chemotherapy, however, she refused and decided that she would pursue radiation.  She received radiation and completed that on 08-14-02. Her subsequent history is as detailed below.   INTERVAL HISTORY: [name] returns today for a follow-up of her stage IV HER-2 positive breast cancer.   She continues on trastuzumab and Pertuzumab with excellent tolerance.  she underwent an echocardiogram on 01/22/2018 with results showing: left ventricular ejection fraction of 50-55%.   [name] was recently hospitalized for sepsis, and she was discharged on 02/08/2018.  She has no more fevers or chills since being discharged from the hospital.  She completed her course of oral antibiotics.     REVIEW OF SYSTEMS: [name] is upset today.  She is crying because she says that she is forgetful.  She says she doesn't like to drive because she is so forgetful.  She says it has been going on for a year.  She can't tell me if it is worsening.  She says she is reading and going to the park to help keep her brain sharp.  [name] is independent in her activities of daily living.  She enjoys juicing.  She has pain from her metastatic cancer.  This pain is located in her upper body including her torso, upper back, arms, fingers.  She says that her pain is  currently a 9/10.  She calls her pain chronic and says that it can be anywhere.  She describes it as a normal pain.  Her pain control regimen includes Methadone and Percocet.  She says she takes Methadone 48m, one in the morning and one at night.  She says she is also taking four Percocets in a day, but sometimes five in a day, she says that she is taking it more often because she  needs it.    [name] having normal bowel movements.  MKeilanyis nauseated and when this happens she takes a pill for nausea.  She has occasional headaches that she takes Advil when needed, which helps.      PAST MEDICAL HISTORY: Past Medical History:  Diagnosis Date  . Breast cancer ([facility]   . DVT (deep venous thrombosis) ([facility] 10/07/2011  . Fever 02/06/2018  . Hypertension   . Radiation 11/29/2005-12/29/2005   right supraclavicular area 4147 cGy  . Radiation 06/26/02-08/14/02   right breast 5040 cGy, tumor bed boosted to 1260 cGy  . Rheumatic fever     PAST SURGICAL HISTORY: Past Surgical History:  Procedure Laterality Date  . BREAST SURGERY    . MASTECTOMY     bilateral  . PORT A CATH REVISION      FAMILY HISTORY Family History  Problem Relation Age of Onset  . Pancreatic cancer Mother   . Kidney cancer Sister 533 . Breast cancer Sister 669 . Breast cancer Other 337 The patient's father died in his 864Q from complications of alcohol abuse. The patient's mother died in her 567sfrom pancreatic cancer. The patient has 6 brothers, 2 sisters. Both her sisters have had breast cancer, both diagnosed after the age of 516 The patient has not had genetic testing so far.   GYNECOLOGIC HISTORY: Menarche age 62 first live birth age 62 She is GXP4. She is not sure when she stopped having periods. She never used hormone replacement.  SOCIAL HISTORY: The patient has worked in the past as a nPsychologist, counselling At home in addition to the patient are her husband [name], originally from [place] who works as a tAdministrator  Also living in the home is her daughter SLeroy Kennedy(she is studying to be a nMarine scientist.  The other 2 children are NMicah Noel who has a college degree in psychology and works as a wProbation officer and [place]issie [place] a pHigher education careers adviserSon JWille Glaseralso sometimes stays in the home. In addition the patient has an aid who helps her almost daily.   ADVANCED DIRECTIVES:  Not in place  HEALTH MAINTENANCE: Social History   Tobacco Use  . Smoking status: Light Tobacco Smoker  . Smokeless tobacco: Never Used  Substance Use Topics  . Alcohol use: Yes    Comment: Rarely  . Drug use: No     Colonoscopy:  Not on file  PAP:  Not on file  Bone density:  Not on file  Lipid panel:  Not on file    Allergies  Allergen Reactions  . Pork-Derived Products Other (See Comments)    Pt says she just doesn't eat pork; has no reaction to pork products  . Tramadol Nausea Only  . Hydrocodone-Acetaminophen Itching and Rash    Current Outpatient Medications  Medication Sig Dispense Refill  . Ascorbic Acid (VITAMIN C) 100 MG tablet Take 100 mg by mouth daily.      . calcium-vitamin D (OSCAL WITH D) 500-200 MG-UNIT per tablet Take  1 tablet by mouth daily.      Marland Kitchen gabapentin (NEURONTIN) 300 MG capsule Take 1 capsule (300 mg total) by mouth daily. 90 capsule 3  . hydrOXYzine (ATARAX/VISTARIL) 10 MG tablet Take 1 tablet (10 mg total) by mouth 3 (three) times daily as needed for anxiety. 20 tablet 0  . Multiple Vitamin (MULTIVITAMIN) capsule Take 1 capsule by mouth daily.      Marland Kitchen oxyCODONE-acetaminophen (PERCOCET) 10-325 MG tablet Take 1 tablet by mouth 3 (three) times daily as needed. for pain 60 tablet 0  . potassium chloride (MICRO-K) 10 MEQ CR capsule Take 2 capsules (20 mEq total) by mouth daily. 60 capsule 3  . prochlorperazine (COMPAZINE) 10 MG tablet Take 1 tablet (10 mg total) by mouth every 6 (six) hours as needed for nausea or vomiting. 20 tablet 0  . sertraline (ZOLOFT) 50 MG tablet Take 1 tablet (50 mg total) by mouth daily. 30 tablet 3  . triamterene-hydrochlorothiazide (DYAZIDE) 37.5-25 MG capsule Take 1 each (1 capsule total) by mouth every morning. 30 capsule 0  . methadone (DOLOPHINE) 10 MG tablet Take 1 tablet (10 mg total) by mouth every 12 (twelve) hours. 30 tablet 0   No current facility-administered medications for this visit.     Facility-Administered Medications Ordered in Other Visits  Medication Dose Route Frequency Provider Last Rate Last Dose  . sodium chloride 0.9 % injection 10 mL  10 mL Intravenous PRN [name], [name], MD   10 mL at 03/04/14 1421  . sodium chloride 0.9 % injection 10 mL  10 mL Intravenous PRN [name], [name], MD   10 mL at 02/13/18 1459    OBJECTIVE:   Vitals:   02/21/18 1126  BP: 130/81  Pulse: 66  Resp: 18  Temp: 98.7 F (37.1 C)  SpO2: 99%     Body mass index is 30.97 kg/m.    ECOG FS: 1 Filed Weights   02/21/18 1126  Weight: 186 lb 1.6 oz (84.4 kg)   GENERAL: Patient is a chronically ill appearing female in no acute distress HEENT:  Sclerae anicteric.  Oropharynx clear and moist. No ulcerations or evidence of oropharyngeal candidiasis. Neck is supple.  NODES:  No cervical, supraclavicular, or axillary lymphadenopathy palpated.  BREAST EXAM:  Deferred. LUNGS:  Clear to auscultation bilaterally.  No wheezes or rhonchi. HEART:  Regular rate and rhythm. No murmur appreciated. ABDOMEN:  Soft, nontender.  Positive, normoactive bowel sounds. No organomegaly palpated. MSK:  No focal spinal tenderness to palpation. Full range of motion bilaterally in the upper extremities. EXTREMITIES:  No peripheral edema.   SKIN:  Clear with no obvious rashes or skin changes. No nail dyscrasia. NEURO:  Nonfocal. Well oriented.  Appropriate affect.     Right Arm and Back 01/17/2018      Photo 01 /23 /2019     Photo 07/04/2017     Photo 04/06/2017    LAB RESULTS:.  Lab Results  Component Value Date   WBC 6.8 02/21/2018   NEUTROABS 4.7 02/21/2018   HGB 10.4 (L) 02/21/2018   HCT 34.2 (L) 02/21/2018   MCV 87.9 02/21/2018   PLT 286 02/21/2018      Chemistry      Component Value Date/Time   NA 141 02/21/2018 1106   NA 142 11/09/2017 1214   K 4.2 02/21/2018 1106   K 3.9 11/09/2017 1214   CL 105 02/21/2018 1106   CL 101 03/07/2013 1411   CO2 30 (H) 02/21/2018  1106  CO2 32 (H) 11/09/2017 1214   BUN 12 02/21/2018 1106   BUN 19.1 11/09/2017 1214   CREATININE 0.85 02/21/2018 1106   CREATININE 0.91 02/13/2018 1354   CREATININE 1.0 11/09/2017 1214      Component Value Date/Time   CALCIUM 9.6 02/21/2018 1106   CALCIUM 9.1 11/09/2017 1214   ALKPHOS 108 02/21/2018 1106   ALKPHOS 120 11/09/2017 1214   AST 15 02/21/2018 1106   AST 21 02/13/2018 1354   AST 30 11/09/2017 1214   ALT 9 02/21/2018 1106   ALT 15 02/13/2018 1354   ALT 19 11/09/2017 1214   BILITOT 0.2 02/21/2018 1106   BILITOT 0.3 02/13/2018 1354   BILITOT <0.22 11/09/2017 1214       STUDIES: Since her last visit to the office, she underwent an echocardiogram on 01/22/2018 with results showing: left ventricular ejection fraction of 50-55%.    ASSESSMENT:  62 y.o. [place] woman with stage IV breast cancer manifested chiefly by loco-regional nodal disease (neck, chest) and skin involvement, without liver, bone, or brain metastases documented   (1) Status post right upper inner quadrant lumpectomy and sentinel lymph node sampling 03/04/2002 for 2 separate foci of invasive ductal carcinoma, mpT2 pN1 or stage IIB, both foci grade 3, both estrogen and progesterone receptor positive, both HercepTest 3+, Mib-1 56%  (2) Reexcision for margins 05/27/2002 showed no residual cancer in the breast.  (3) The patient refused adjuvant systemic therapy.  (4) Adjuvant radiation treatment completed 08/14/2002.  (5) recurrence in the right breast in 02/2004 showing a morphologically different tumor, again grade 3, again estrogen and progesterone receptor negative, with an MIB-1 of 14% and Herceptest 3+.  (5) Between 03/2004 and 07/2004 she received dose dense Doxorubicin/Cyclophosphamide x 4 given with trastuzumab, followed by weekly Docetaxel x 8, again given with trastuzumab.  (6) Right mastectomy 07/13/2004 showed scattered microscopic foci of residual disease over an area  greater than 5 cm.  Margins were negative.  (7) Postoperative Docetaxel continued until 09/2004.  (8) Trastuzumab (Herceptin) given 08/2004 through 01/2012 with some brief interruptions.  (9) Isolated right cervical nodal recurrence 10/2005, treated with radiation to the right supraclavicular area (total 41.5 gray) completed 12/29/2005.  (10) Navelbine given together with Herceptin  11/2005 through 03/2006.  (9) Left mastectomy 02/13/2006 for ductal carcinoma in situ, grade 2, estrogen and progesterone receptor negative, with negative margins; 0 of 3 lymph nodes involved  (10) treated with Lapatinib and Capecitabine before 10/2009, for an unclear duration and with unclear results (cannot locate data on chart review).  (11) Status post right supraclavicular lymph node biopsy 09/2010 again positive for an invasive ductal carcinoma, estrogen and progesterone receptor negative, HER-2 positive by C[facility] with a ratio 4.25.  (12) Navelbine given together with Herceptin between 05/2011 and 11/2011.  (13) Carboplatin/ Gemcitabine/ Herceptin given for 2 cycles, in 12/2011 and 01/2012.  (14) TDM-1 ([place]la) started 02/2012. Last dose 10/02/2013 after which the patient discontinued treatment at her own discretion. Echo on December 2014 showed a well preserved ejection fraction.  (15) Deep vein thrombosis of the right upper extremity documented 04/20/2011.  She completed anticoagulant therapy with Coumadin on 03/25/2013.  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 Left chest Port-A-Cath placed on 04/08/2013; being flushed every 6 weeks  RIGHT UPPER EXTREMITY ANGIOSARCOMA VS BREAST  CANCER: August 2016 (18) treated at [facility] of [place] August 2016 with paclitaxel, trastuzumab  and pertuzumab 1 cycle, afterwards referred to hospice  (19) under the care of hospice of [facility] August 2016 to 05/08/2016, when the patient opted for a second try at chemotherapy  (20) started low-dose Abraxane, trastuzumab and pertuzumab 06/07/2016, to be repeated every 3 weeks.   (a) pretreatment echocardiogram 06/06/2016 showed a 60-65% ejection fraction  (b) significant compliance problems compromise response to treatment  (c)  echocardiogram 02/20/17 LVEF 55-60%  (d) Abraxane discontinued 07/04/2017 because of possible neuropathy symptoms  (e) Abraxane resumed 09/27/2017  (f) echocardiogram 07/20/2017 showed an ejection fraction of 60-65%  (g) echo 01/22/2018 showed an ejection fraction of 50-55%  (21) Chronic pain secondary to known metastatic disease  (a) patient signed pain contract 10/19/2016  (b) as of 11/16/2016 receives her pain medications from Dr [name]  (c)  Dr [name] no longer able to prescribe as of February 2019   PLAN:  Mechille declines to receive treatment today due to her being recently discharged from the hospital and not feeling comfortable getting treatment so soon after discharge for an infection.  She is taking her Methadone as prescribed 34m Q12H.  She is taking the Percoet four and five times per day.  I reviewed [place] CSRS which reveals no recent red flags.  In review of her recent urine drug screen, there was insufficient urine to test for Methadone or Oxycodone.    I reviewed MNiangin detail with Dr. MJana Hakim particularly her pain regimen, considering her history of red flags while taking controlled substances.  Since she had insufficient urine to test for methadone and oxycodone, she will need to leave another urine drug screen today.  Per Dr. MJana Hakim once she leaves the urine sample, her prescriptions can be refilled, Methadone #30 and Percocet #60 were sent to her pharmacy.     I informed MRhylynnof the need for another urine sample for a  urine drug screen.  She began to raise her voice and wanted to know why we didn't trust her.  She says that we are embarrassing her and that we are not taking care of her by making her leave her urine.  She said that she didn't appreciate me being smart with her.  I informed her that while we are prescribing her pain medications that we will test her urine, or she will not get [place]  I offered to get Dr. MJana Hakim for her to tell him her concerns, but she declined.  I walked her to the lab, while she continued to raise her voice and tell me that we were embarrassing and that she had been a patient here for 18 years, and that we needed to trust the fact that she was taking her medications.  I asked her to please lower her voice and not speak to me in the way that she was.  She then changed her mind and requested to talk to Dr. MJana Hakim  I called him on the phone and he was at the hospital and offered to talk to her when he returned in 20 minutes.  I informed MAerikaof this.  She declined waiting and left.  She thanked me for sending in the prescriptions.    MVelinawill return in 3 weeks for labs, f/u and her next treatment.  She has not been imaged in about a year here, and with her concern over her skin lesions and her continued pain,  I ordered a repeat bone scan and CT chest for evaluation of progression.    A total of (30) minutes of face-to-face time was spent with this patient with greater than 50% of that time in counseling and care-coordination.   Wilber Bihari, NP  02/21/18 1:24 PM Medical Oncology and Hematology [facility] [address] [name], [place] [address] Tel. [phone]    Fax. [phone]

## 2018-02-22 ENCOUNTER — Telehealth: Payer: Self-pay | Admitting: Adult Health

## 2018-02-22 NOTE — Telephone Encounter (Signed)
Per 4/17 no los

## 2018-02-24 LAB — MISC LABCORP TEST (SEND OUT): Labcorp test code: 763897

## 2018-02-26 ENCOUNTER — Telehealth: Payer: Self-pay | Admitting: *Deleted

## 2018-02-26 ENCOUNTER — Ambulatory Visit: Payer: Self-pay | Admitting: Physician Assistant

## 2018-02-26 LAB — METHADONE CONFIRMATION, URINE: Methadone: POSITIVE — AB

## 2018-02-26 MED ORDER — TRIAMTERENE-HCTZ 37.5-25 MG PO CAPS: 1.0000 | ORAL_CAPSULE | Freq: Every morning | ORAL | 0 refills | Status: DC

## 2018-02-26 NOTE — Telephone Encounter (Signed)
This RN spoke with [name] per her 2nd call today - [name] left a VM stating "why didn't [name] see me " " I ain't feeling so good " Per phone discussion [name] first asked " what was in my urine that they charged me $45?".  This RN informed pt above was for the drug testing for methadone. This RN reiterated concern is pt's who are on pain medications need to be monitored so the MD is not prescribing medications that can cause life threatening issues.  [name] then stated " I think those methadone are causing me to break out "  With further discussion [name] means that she " feels like the methadone is making the tumors pop up more "  This RN validated her concerns but above is not a known side effect of this medication.  [name] then stated " I need to see [name] - why is not seeing me "  This RN explained that [name] Ryder NP sees pt especially on Wednesday due to MD is in Breast Clinic that day - [name] states she would like to " just see [name] " ,  Plan per above is to change her day of therapy to a Tuesday instead of Wednesday. Request sent to scheduling per above.  [name] stated concern that " I think I need a different medication because my bumps on my back aren't gone - the skin is closed up but the bumps are still under my skin "  [name] is requesting [name] to contact Dr Pablo Ledger at the [facility] " so they can discuss what is best for me "  " I called them and gave them permission and my password is mom"  She gave this RN phone number she has for Dr Pablo Ledger of [phone].  This RN informed her above information will be given to MD for his review of her request.  No further needs at this time.

## 2018-03-09 ENCOUNTER — Other Ambulatory Visit: Payer: Self-pay | Admitting: Adult Health

## 2018-03-09 DIAGNOSIS — C50211 Malignant neoplasm of upper-inner quadrant of right female breast: Secondary | ICD-10-CM

## 2018-03-09 DIAGNOSIS — C4911 Malignant neoplasm of connective and soft tissue of right upper limb, including shoulder: Secondary | ICD-10-CM

## 2018-03-09 DIAGNOSIS — Z170 Estrogen receptor positive status [ER+]: Secondary | ICD-10-CM

## 2018-03-19 ENCOUNTER — Telehealth: Payer: Self-pay

## 2018-03-19 ENCOUNTER — Other Ambulatory Visit: Payer: Self-pay | Admitting: Oncology

## 2018-03-19 NOTE — Telephone Encounter (Signed)
Pt has left 3 VMs today regarding she needs to see her oncologist , has appt on Wednesday but appt with Mendel Ryder NP, and pt would prefer appt with [name].   Notified [name].  Called pt back and per [name], pt can keep her appt with Mendel Ryder NP on Wednesday and he can see her with Mendel Ryder NP or pt can keep her infusion appt on Wednesday and come back this week on Friday 5-17, at noon to have appt with [name]. Pt wants to cancel her appt with Mendel Ryder NP on Wed 5-15, she said she will come to her infusion appt on Wed  5-15 ( starting with lab at 9:30 am) and take the appt at noon on 5-17 Friday with [name].  No other needs per pt at this time.

## 2018-03-19 NOTE — Telephone Encounter (Signed)
Patient called to confirm appointment times for 03/21/18. Appointment times provided. Patient encouraged to arrive 15 minutes early for check-in. Patient verbalized understanding.

## 2018-03-20 NOTE — Progress Notes (Signed)
No show

## 2018-03-21 ENCOUNTER — Inpatient Hospital Stay: Payer: Commercial Managed Care - PPO | Admitting: Adult Health

## 2018-03-21 ENCOUNTER — Inpatient Hospital Stay: Payer: Commercial Managed Care - PPO | Attending: Oncology

## 2018-03-21 ENCOUNTER — Inpatient Hospital Stay: Payer: Commercial Managed Care - PPO

## 2018-03-21 ENCOUNTER — Other Ambulatory Visit: Payer: Commercial Managed Care - PPO

## 2018-03-21 ENCOUNTER — Ambulatory Visit: Payer: Commercial Managed Care - PPO

## 2018-03-21 ENCOUNTER — Encounter: Payer: Self-pay | Admitting: General Practice

## 2018-03-21 ENCOUNTER — Other Ambulatory Visit: Payer: Self-pay | Admitting: *Deleted

## 2018-03-21 VITALS — BP 105/74 | HR 61 | Temp 98.8°F | Resp 16

## 2018-03-21 DIAGNOSIS — C792 Secondary malignant neoplasm of skin: Secondary | ICD-10-CM | POA: Diagnosis not present

## 2018-03-21 DIAGNOSIS — Z79899 Other long term (current) drug therapy: Secondary | ICD-10-CM | POA: Diagnosis not present

## 2018-03-21 DIAGNOSIS — C50211 Malignant neoplasm of upper-inner quadrant of right female breast: Secondary | ICD-10-CM | POA: Insufficient documentation

## 2018-03-21 DIAGNOSIS — Z5112 Encounter for antineoplastic immunotherapy: Secondary | ICD-10-CM | POA: Insufficient documentation

## 2018-03-21 DIAGNOSIS — Z170 Estrogen receptor positive status [ER+]: Secondary | ICD-10-CM

## 2018-03-21 DIAGNOSIS — G893 Neoplasm related pain (acute) (chronic): Secondary | ICD-10-CM | POA: Diagnosis not present

## 2018-03-21 DIAGNOSIS — Z95828 Presence of other vascular implants and grafts: Secondary | ICD-10-CM

## 2018-03-21 DIAGNOSIS — Z5111 Encounter for antineoplastic chemotherapy: Secondary | ICD-10-CM | POA: Diagnosis not present

## 2018-03-21 DIAGNOSIS — C778 Secondary and unspecified malignant neoplasm of lymph nodes of multiple regions: Secondary | ICD-10-CM | POA: Diagnosis not present

## 2018-03-21 DIAGNOSIS — C4911 Malignant neoplasm of connective and soft tissue of right upper limb, including shoulder: Secondary | ICD-10-CM

## 2018-03-21 LAB — COMPREHENSIVE METABOLIC PANEL
ALT: 10 U/L (ref 0–55)
AST: 18 U/L (ref 5–34)
Albumin: 3.6 g/dL (ref 3.5–5.0)
Alkaline Phosphatase: 111 U/L (ref 40–150)
Anion gap: 5 (ref 3–11)
BUN: 11 mg/dL (ref 7–26)
CO2: 33 mmol/L — ABNORMAL HIGH (ref 22–29)
Calcium: 9.2 mg/dL (ref 8.4–10.4)
Chloride: 101 mmol/L (ref 98–109)
Creatinine, Ser: 0.99 mg/dL (ref 0.60–1.10)
GFR calc Af Amer: >60 mL/min (ref >60)
GFR calc non Af Amer: 60 mL/min — ABNORMAL LOW (ref >60)
Glucose, Bld: 97 mg/dL (ref 70–140)
Potassium: 3.9 mmol/L (ref 3.5–5.1)
Sodium: 139 mmol/L (ref 136–145)
Total Bilirubin: 0.2 mg/dL (ref 0.2–1.2)
Total Protein: 7.5 g/dL (ref 6.4–8.3)

## 2018-03-21 LAB — CBC WITH DIFFERENTIAL/PLATELET
Basophils Absolute: 0.1 10*3/uL (ref 0.0–0.1)
Basophils Relative: 1 %
Eosinophils Absolute: 0.2 10*3/uL (ref 0.0–0.5)
Eosinophils Relative: 4 %
HCT: 34.4 % — ABNORMAL LOW (ref 34.8–46.6)
Hemoglobin: 11.1 g/dL — ABNORMAL LOW (ref 11.6–15.9)
Lymphocytes Relative: 23 %
Lymphs Abs: 1.3 10*3/uL (ref 0.9–3.3)
MCH: 26.7 pg (ref 25.1–34.0)
MCHC: 32.1 g/dL (ref 31.5–36.0)
MCV: 83.2 fL (ref 79.5–101.0)
Monocytes Absolute: 0.6 10*3/uL (ref 0.1–0.9)
Monocytes Relative: 11 %
Neutro Abs: 3.6 10*3/uL (ref 1.5–6.5)
Neutrophils Relative %: 61 %
Platelets: 227 10*3/uL (ref 145–400)
RBC: 4.14 MIL/uL (ref 3.70–5.45)
RDW: 16 % — ABNORMAL HIGH (ref 11.2–14.5)
WBC: 5.8 10*3/uL (ref 3.9–10.3)

## 2018-03-21 MED ORDER — SODIUM CHLORIDE 0.9 % IV SOLN
420.0000 mg | Freq: Once | INTRAVENOUS | Status: AC
  Administered 2018-03-21: 420 mg via INTRAVENOUS
  Filled 2018-03-21: qty 14

## 2018-03-21 MED ORDER — SODIUM CHLORIDE 0.9 % IJ SOLN
10.0000 mL | INTRAMUSCULAR | Status: DC | PRN
  Administered 2018-03-21: 10 mL via INTRAVENOUS
  Filled 2018-03-21: qty 10

## 2018-03-21 MED ORDER — SODIUM CHLORIDE 0.9 % IV SOLN
Freq: Once | INTRAVENOUS | Status: AC
  Administered 2018-03-21: 13:00:00 via INTRAVENOUS

## 2018-03-21 MED ORDER — ACETAMINOPHEN 325 MG PO TABS
ORAL_TABLET | ORAL | Status: AC
  Filled 2018-03-21: qty 2

## 2018-03-21 MED ORDER — PACLITAXEL PROTEIN-BOUND CHEMO INJECTION 100 MG
100.0000 mg/m2 | Freq: Once | Status: AC
  Administered 2018-03-21: 200 mg via INTRAVENOUS
  Filled 2018-03-21: qty 40

## 2018-03-21 MED ORDER — OXYCODONE-ACETAMINOPHEN 10-325 MG PO TABS: 1.0000 | ORAL_TABLET | Freq: Three times a day (TID) | ORAL | 0 refills | Status: DC | PRN

## 2018-03-21 MED ORDER — ACETAMINOPHEN 325 MG PO TABS
650.0000 mg | ORAL_TABLET | Freq: Once | ORAL | Status: AC
  Administered 2018-03-21: 650 mg via ORAL

## 2018-03-21 MED ORDER — PROCHLORPERAZINE MALEATE 10 MG PO TABS
10.0000 mg | ORAL_TABLET | Freq: Once | ORAL | Status: AC
  Administered 2018-03-21: 10 mg via ORAL

## 2018-03-21 MED ORDER — SODIUM CHLORIDE 0.9% FLUSH
10.0000 mL | INTRAVENOUS | Status: DC | PRN
  Administered 2018-03-21: 10 mL
  Filled 2018-03-21: qty 10

## 2018-03-21 MED ORDER — SODIUM CHLORIDE 0.9 % IV SOLN
6.0000 mg/kg | Freq: Once | INTRAVENOUS | Status: AC
  Administered 2018-03-21: 525 mg via INTRAVENOUS
  Filled 2018-03-21: qty 25

## 2018-03-21 MED ORDER — METHADONE HCL 10 MG PO TABS: 10.0000 mg | ORAL_TABLET | Freq: Two times a day (BID) | ORAL | 0 refills | Status: DC

## 2018-03-21 MED ORDER — PROCHLORPERAZINE MALEATE 10 MG PO TABS
ORAL_TABLET | ORAL | Status: AC
  Filled 2018-03-21: qty 1

## 2018-03-21 MED ORDER — ALTEPLASE 2 MG IJ SOLR
2.0000 mg | Freq: Once | INTRAMUSCULAR | Status: AC | PRN
  Administered 2018-03-21: 2 mg
  Filled 2018-03-21: qty 2

## 2018-03-21 MED ORDER — HEPARIN SOD (PORK) LOCK FLUSH 100 UNIT/ML IV SOLN
500.0000 [IU] | Freq: Once | INTRAVENOUS | Status: AC | PRN
  Administered 2018-03-21: 500 [IU]
  Filled 2018-03-21: qty 5

## 2018-03-21 MED ORDER — DIPHENHYDRAMINE HCL 25 MG PO CAPS
25.0000 mg | ORAL_CAPSULE | Freq: Once | ORAL | Status: AC
  Administered 2018-03-21: 25 mg via ORAL

## 2018-03-21 MED ORDER — ALTEPLASE 2 MG IJ SOLR
INTRAMUSCULAR | Status: AC
  Filled 2018-03-21: qty 2

## 2018-03-21 MED ORDER — DIPHENHYDRAMINE HCL 25 MG PO CAPS
ORAL_CAPSULE | ORAL | Status: AC
  Filled 2018-03-21: qty 1

## 2018-03-21 NOTE — Progress Notes (Signed)
[facility] CSW Progress Notes  Call from patient, wants referral for PCP and pain clinic.  Spoke w patient, provided contact information for [facility] Referral program, [facility], and [facility].  Asked RN Val to refer patient to determine if she is eligible for [facility] community case management.  Advised that CSWs do not make referrals for pain clinic.  Suggested she contact MD Desk Nurse for assistance w this request.  Patient agreeable.    Edwyna Shell, LCSW Clinical Social Worker Phone:  [phone]

## 2018-03-21 NOTE — Patient Instructions (Signed)
[facility] Discharge Instructions for Patients Receiving Chemotherapy  Today you received the following chemotherapy agents: Herceptin, Perjeta, and Abraxane  To help prevent nausea and vomiting after your treatment, we encourage you to take your nausea medication as directed   If you develop nausea and vomiting that is not controlled by your nausea medication, call the clinic.   BELOW ARE SYMPTOMS THAT SHOULD BE REPORTED IMMEDIATELY:  *FEVER GREATER THAN 100.5 F  *CHILLS WITH OR WITHOUT FEVER  NAUSEA AND VOMITING THAT IS NOT CONTROLLED WITH YOUR NAUSEA MEDICATION  *UNUSUAL SHORTNESS OF BREATH  *UNUSUAL BRUISING OR BLEEDING  TENDERNESS IN MOUTH AND THROAT WITH OR WITHOUT PRESENCE OF ULCERS  *URINARY PROBLEMS  *BOWEL PROBLEMS  UNUSUAL RASH Items with * indicate a potential emergency and should be followed up as soon as possible.  Feel free to call the clinic should you have any questions or concerns. The clinic phone number is [phone].  Please show the CHEMO ALERT CARD at check-in to the Emergency Department and triage nurse.   

## 2018-03-21 NOTE — Addendum Note (Signed)
Addended by: Rennis Harding on: 03/21/2018 01:05 PM   Modules accepted: Orders

## 2018-03-23 ENCOUNTER — Inpatient Hospital Stay (HOSPITAL_BASED_OUTPATIENT_CLINIC_OR_DEPARTMENT_OTHER): Payer: Commercial Managed Care - PPO | Admitting: Oncology

## 2018-03-23 DIAGNOSIS — Z170 Estrogen receptor positive status [ER+]: Secondary | ICD-10-CM

## 2018-03-23 DIAGNOSIS — C50211 Malignant neoplasm of upper-inner quadrant of right female breast: Secondary | ICD-10-CM

## 2018-03-23 DIAGNOSIS — C4911 Malignant neoplasm of connective and soft tissue of right upper limb, including shoulder: Secondary | ICD-10-CM

## 2018-03-26 ENCOUNTER — Other Ambulatory Visit: Payer: Self-pay | Admitting: Oncology

## 2018-03-26 ENCOUNTER — Encounter: Payer: Self-pay | Admitting: Oncology

## 2018-03-27 ENCOUNTER — Telehealth: Payer: Self-pay | Admitting: *Deleted

## 2018-03-27 NOTE — Telephone Encounter (Signed)
No entry 

## 2018-03-27 NOTE — Telephone Encounter (Signed)
This RN returned VM from pt who stated " I need a primary MD ".  Note this RN placed a referral to [facility] to assist pt with above request.   Pt was unable to come for scheduled MD appointment 5/17 - appointment is in process for rescheduling.  Per return call - obtained VM - message left per above.

## 2018-03-28 LAB — OXYCODONES,MS,WB/SP RFX
Oxycocone: 37.9 ng/mL
Oxycodones Confirmation: POSITIVE
Oxymorphone: NEGATIVE ng/mL

## 2018-03-28 LAB — OPIATES,MS,WB/SP RFX
6-Acetylmorphine: NEGATIVE
Codeine: NEGATIVE ng/mL
Dihydrocodeine: NEGATIVE ng/mL
Hydrocodone: NEGATIVE ng/mL
Hydromorphone: NEGATIVE ng/mL
Morphine: NEGATIVE ng/mL
Opiate Confirmation: NEGATIVE

## 2018-03-28 LAB — METHADONE,MS,WB/SP RFX
Methadone Confirmation: POSITIVE
Methadone: 322 ng/mL

## 2018-03-30 ENCOUNTER — Other Ambulatory Visit: Payer: Self-pay | Admitting: Oncology

## 2018-03-30 DIAGNOSIS — C50211 Malignant neoplasm of upper-inner quadrant of right female breast: Secondary | ICD-10-CM

## 2018-03-30 DIAGNOSIS — Z170 Estrogen receptor positive status [ER+]: Secondary | ICD-10-CM

## 2018-03-30 LAB — BENZODIAZEPINES,MS,WB/SP RFX
7-Aminoclonazepam: NEGATIVE ng/mL
Alprazolam: 26.6 ng/mL
Benzodiazepines Confirm: POSITIVE
Chlordiazepoxide: NEGATIVE ng/mL
Clonazepam: NEGATIVE ng/mL
Desalkylflurazepam: NEGATIVE ng/mL
Desmethylchlordiazepoxide: NEGATIVE ng/mL
Desmethyldiazepam: NEGATIVE ng/mL
Diazepam: NEGATIVE ng/mL
Flurazepam: NEGATIVE ng/mL
Lorazepam: NEGATIVE ng/mL
Midazolam: NEGATIVE ng/mL
Oxazepam: NEGATIVE ng/mL
Temazepam: NEGATIVE ng/mL
Triazolam: NEGATIVE ng/mL

## 2018-03-30 LAB — DRUG SCREEN 10 W/CONF, SERUM
Amphetamines, IA: NEGATIVE ng/mL
Barbiturates, IA: NEGATIVE ug/mL
Benzodiazepines, IA: POSITIVE ng/mL
Cocaine & Metabolite, IA: NEGATIVE ng/mL
Methadone, IA: POSITIVE ng/mL
Opiates, IA: NEGATIVE ng/mL
Oxycodones, IA: POSITIVE ng/mL
Phencyclidine, IA: NEGATIVE ng/mL
Propoxyphene, IA: NEGATIVE ng/mL
THC(Marijuana) Metabolite, IA: NEGATIVE ng/mL

## 2018-03-30 MED ORDER — METHADONE HCL 10 MG PO TABS: 10.0000 mg | ORAL_TABLET | Freq: Two times a day (BID) | ORAL | 0 refills | Status: DC

## 2018-04-04 ENCOUNTER — Other Ambulatory Visit: Payer: Self-pay

## 2018-04-04 NOTE — Patient Outreach (Signed)
[facility]) Care Management  04/04/2018  [name] 1956/07/28 [id number]  TELEPHONE SCREENING Referral date: 04/04/18 Referral source: referral from [facility] health cancer center Referral reason: Referral Insurance: [employer] care Attempt #1  Telephone call to patient regarding utilization management referral. Unable to reach patient. HIPAA compliant voice message left with call back phone number.   PLAN: RNCM will attempt 2nd telephone call to patient within 4 business days. RNCM will send outreach letter.   Quinn Plowman RN,BSN,CCM [facility] Telephonic  [phone]

## 2018-04-06 ENCOUNTER — Other Ambulatory Visit: Payer: Self-pay

## 2018-04-06 NOTE — Patient Outreach (Signed)
[facility]) Care Management  04/06/2018  [name] 01/10/1956 [id number]    TELEPHONE SCREENING Referral date: 04/04/18 Referral source: referral from [facility] health cancer center Referral reason: Referral Insurance: [employer] health care Attempt #2  Telephone call to patient regarding [facility] cancer center referral. Attempted listed home number. Unable to reach patient. HIPAA compliant voice message left with call back phone number.  Called listed mobile number.  Message states, "mailbox is full and cannot accept new messages."   PLAN: RNCM will attempt 3rd telephone call within 4 business days.   Quinn Plowman RN,BSN,CCM [facility] Telephonic  [phone]

## 2018-04-10 ENCOUNTER — Other Ambulatory Visit: Payer: Self-pay

## 2018-04-10 NOTE — Patient Outreach (Signed)
[place] [facility]) Care Management  04/10/2018  [name] 12-02-1955 [id number]  TELEPHONE SCREENING Referral date: 03/22/18 Referral source: [place] [facility] Referral reason: no specific referral reason noted Insurance: [place] health care  Telephone call to patient regarding cancer center referral. HIPAA verified with patient. Explained reason for call.  Patient states she has bilateral breast cancer with double mastectomy.  Patient states she has HER-2. Reports she is getting chemotherapy monthly. Patientt states she has been getting chemotherapy since 2004.  Patient states she was on hospice for two years. States she was released from hospice and has been doing good over the past 2 years. Patient reports she has tumors on her back and continues to take treatment.   She states she needs to find a new primary doctor and pain doctor.  Patient reports she was seeing a pain management doctor but she had to pay $175 for every visit. Patient states only cash was accepted. Patient states she sees Dr. Jana [name] or his nurse practitioner. She states her last visit with Dr. Jana [name] was in January 2019.  Patient states she is not satisfied with the pain medication Dr. Jana [name] is prescribing for her.  She reports the dosage for the methadone and the percocet is not enough. Patient states she doesn't get to see Dr. Jana [name] regularly. States she sees the Designer, jewellery. Patient states, " I do not get along with her."  Patient states she has pain in her knees, arms, and back. Patient states she is not able to find another pain management doctor who will  take her.  Patient states her right arm is giving her a lot of problems. Patient states she has lymphedema in her right arm and wears a sleeve. Patient reports she is afraid to take the sleeve off because she doesn't want to have a big arm.  Patient reports she forgets a lot due to the chemo. Patient states she can only drive in  [place] because she forgets a lot. States she is unable to drive anywhere further than [place].  Patient states she doesn't have an aid anymore.  States she feels like she needs assistance and would like to get on the CAP program.  Patient states she has anxiety and is tearful. Patient states the chemo has caused her mind to work slow and has affected her memory.  Patient states she has had 1 fall within the past 3 months.  States she fell out of bed while sleeping. Denied injury from fall.  RNCM discussed and offered [facility] care management services. Patient verbally agreed.  Patient outside at time of call. Patient verbally agreed to nurse calling her home number to leave [facility] name and contact information along with 24 hour nurse advise line number. .   ASSESSMENT: Per physician note patient has stage IV HER - 2 Positive breast cancer.  Difficult getting history from patient. Patient requires repeating of questions for understanding. Patient expresses ongoing concern about her memory.  Has care coordination needs. Per chart last office visit scheduled with Dr. Vinie [name] was a no show.   PLAN:  RNCM will refer patient to community case Freight forwarder and Education officer, museum.  [name] Plowman RN,BSN,CCM [facility] Telephonic  [phone]

## 2018-04-11 ENCOUNTER — Other Ambulatory Visit: Payer: Self-pay

## 2018-04-11 NOTE — Patient Outreach (Signed)
[facility]) Care Management  04/11/2018  [name] [birth year] [id number]  Referral received 04/11/18 per [facility]. Per telephonic care coordinator, client did not show for her last oncology visit. Per notes, client is requesting assistance with finding a primary care physician and pain clinic.  62 year old with history of malignant neoplasm of right breast, chronic pain, HTN, GERD, DVT.  RNCM called to schedule home visit. RNCM called both mobile and home number. No answer. HIPPA compliant message left.  04/11/18 1620 RNCM received return call from client. RNCM confirmed two patient identifiers. When RNCM attempted to schedule visit, client asks, "why do you want to come to my house?". RNCM reiterated purpose of care management program and reason for referral. Client states she wants to cooperate since referral came from the cancer center.   Client primarily states she is looking to find a pain management clinic, then agrees that she needs to find a primary care physician also. Client states she heard that [facility] is a pain management client. RNCM expressed that it is important to find a place that will take her insurance, then client states, "I dont have no doctor that can take my card". RNCM clarified statement regarding the importance of finding a doctor that will file with her insurance. Client then verbalized understanding. Client stated she was driving and expressed that she will call RNCM back when she gets home.  Plan: Home visit scheduled for next week.  Thea Silversmith, RN, MSN, [place] Coordinator Cell: [phone]

## 2018-04-12 ENCOUNTER — Other Ambulatory Visit: Payer: Self-pay | Admitting: Licensed Clinical Social Worker

## 2018-04-12 ENCOUNTER — Ambulatory Visit: Payer: Self-pay

## 2018-04-12 NOTE — Patient Outreach (Signed)
[place] [facility]) Care Management  04/12/2018  [name] [birth year] [id number]  Assessment-CSW completed outreach attempt today after receiving new referral on patient on 04/11/18. CSW unable to reach patient successfully. CSW left a HIPPA compliant voice message encouraging patient to return call once available.  Plan-CSW will await return call or complete an additional outreach if needed.  [name], BSW, MSW, [email or web address] Phone: [phone] Fax: [phone]

## 2018-04-16 ENCOUNTER — Other Ambulatory Visit: Payer: Self-pay | Admitting: Licensed Clinical Social Worker

## 2018-04-16 NOTE — Progress Notes (Signed)
[name]  [phone] [phone]) [phone]     ID: [name]   DOB: 10-16-1956  MR#: [id number]  OQH#:[id number]  Patient Care Team: Patient, No Pcp Per as PCP - General (General Practice) [name], [name], MD as Consulting Physician (Oncology) [name], MD as Consulting Physician (Radiation Oncology) Stark Klein, MD as Consulting Physician (General Surgery) [name], [name], MD as Consulting Physician (Cardiology) Luretha Rued, RN as [place], [place], LCSW as [place] (Licensed Clinical Social Worker) [name], [name] 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  She sought attention and had a mammogram which showed an obvious carcinoma in the upper inner quadrant of the right breast, approximately 2 cm.  There were some enlarged lymph nodes in the axilla and an FNA done showed those consistent with malignant cells, most likely an invasive ductal carcinoma.  At that point she was unsure of what to do and was referred by Dr. [name] for a discussion of her treatment options.  By biopsy, it was ER/PR negative and HER-2 was 3+.  DNA index was 1.42.  We reiterated that it was most important to have her disease surgically addressed at that point and had recommended lumpectomy and axillary nodes to be addressed with which she followed through and on 04-03-02, Dr. [name] performed a right partial mastectomy and a right axillary lymph node dissection.  Final pathology revealed a 2.4 cm. high grade, Grade III invasive ductal  carcinoma with an adjacent .8 cm. also high grade invasive ductal carcinoma which was felt to represent an intramammary metastasis rather than a second primary.  A smaller mass was just medial to the larger mass.  The smaller mass was associated with high grade DCIS component.  There was no definite lymphovascular invasion identified.  However, one of fourteen axillary lymph nodes did contain a 1.5 cm. metastatic deposit.  Postoperatively she did very well.  We reiterated the fact that she did need adjuvant chemotherapy, however, she refused and decided that she would pursue radiation.  She received radiation and completed that on 08-14-02. Her subsequent history is as detailed below.   INTERVAL HISTORY: [name] returns today for a follow-up of her stage IV HER-2 positive breast cancer. She receives trastuzumab and Pertuzumab, every 21 days, with a dose due today. She tolerates this well. She denies issues with diarrhea.   She also receives abraxane every 21 days, although the current dose has been delayed a week. She has been on this treatment with some interruptions since 06/07/2016. She is having occasional tingling in her fingers. She denies tingling or numbness in her toes. She notes an improvement in her back lesions.   Her most recent echocardiogram was 01/22/2018.  We are currently doing echoes every 6 months.  In addition to the cancer issue there is a chronic pain issue.  Accordingly I checked PWP aware today.  We had prescribed methadone 10 mg and oxycodone/acetaminophen 10/325.  She filled the Percocet, 60 tablets, on 03/21/2018.  However she is receiving methadone oxycodone and alprazolam through [name] PA-C at [facility] in [place], [place].  Specifically she received 75 alprazolam, 90 oxycodone and 120 methadone all of those on 04/03/2018.  Accordingly at this point we are not prescribing any further pain medication for [facility]. She will continue to receive her pain  medications through [facility] management  Note that we had referred the patient to Dr. Johnny Bridge in [place] but the patient did not show for the appointment.   REVIEW OF SYSTEMS: [name] reports that she has been doing great. She sees a pain clinic in [place]. The right arm has less pain, but she is still limited. She fell out of her bed a few times. She admits that she has been doing better and she has been spending a lot of time with her family. Her daughter is there everyday and she spends time with her grandson. She is planning on going to [place] in December 2019 on a cruise. She denies unusual headaches, visual changes, nausea, vomiting, or dizziness. There has been no unusual cough, phlegm production, or pleurisy. This been no change in bowel or bladder habits. She denies unexplained fatigue or unexplained weight loss, bleeding, rash, or fever. A detailed review of systems was otherwise stable.    PAST MEDICAL HISTORY: Past Medical History:  Diagnosis Date  . Breast cancer ([place])   . DVT (deep venous thrombosis) ([place]) 10/07/2011  . Fever 02/06/2018  . Hypertension   . Radiation 11/29/2005-12/29/2005   right supraclavicular area 4147 cGy  . Radiation 06/26/02-08/14/02   right breast 5040 cGy, tumor bed boosted to 1260 cGy  . Rheumatic fever     PAST SURGICAL HISTORY: Past Surgical History:  Procedure Laterality Date  . BREAST SURGERY    . MASTECTOMY     bilateral  . PORT A CATH REVISION      FAMILY HISTORY Family History  Problem Relation Age of Onset  . Pancreatic cancer Mother   . Kidney cancer Sister 56  . Breast cancer Sister 79  . Breast cancer Other 34  The patient's father died in his 82N  from complications of alcohol abuse. The patient's mother died in her 61s from pancreatic cancer. The patient has 6 brothers, 2 sisters. Both her sisters have had breast cancer, both diagnosed after the age of 46. The patient has not had genetic testing so  far.   GYNECOLOGIC HISTORY: Menarche age 55; first live birth age 47. She is GXP4. She is not sure when she stopped having periods. She never used hormone replacement.  SOCIAL HISTORY: The patient has worked in the past as a Psychologist, counselling. At home in addition to the patient are her husband [name], originally from [place], who works as a Administrator.  Also living in the home is her daughter [name] (she is studying to be a Marine scientist).  The other 2 children are [name], who has a college degree in psychology and works as a Probation officer; and [name], who lives in [place] and works as a Higher education careers adviser. [name] also sometimes stays in the home. In addition the patient has an aid who helps her almost daily.   ADVANCED DIRECTIVES: Not in place  HEALTH MAINTENANCE: Social History   Tobacco Use  . Smoking status: Light Tobacco Smoker  . Smokeless tobacco: Never Used  Substance Use Topics  . Alcohol use: Yes    Comment: Rarely  . Drug use: No     Colonoscopy:  Not on file  PAP:  Not on file  Bone density:  Not on file  Lipid panel:  Not on  file    Allergies  Allergen Reactions  . Pork-Derived Products Other (See Comments)    Pt says she just doesn't eat pork; has no reaction to pork products  . Tramadol Nausea Only  . Hydrocodone-Acetaminophen Itching and Rash    Current Outpatient Medications  Medication Sig Dispense Refill  . Ascorbic Acid (VITAMIN C) 100 MG tablet Take 100 mg by mouth daily.      . calcium-vitamin D (OSCAL WITH D) 500-200 MG-UNIT per tablet Take 1 tablet by mouth daily.      Marland Kitchen gabapentin (NEURONTIN) 300 MG capsule Take 1 capsule (300 mg total) by mouth daily. 90 capsule 3  . hydrOXYzine (ATARAX/VISTARIL) 10 MG tablet Take 1 tablet (10 mg total) by mouth 3 (three) times daily as needed for anxiety. 20 tablet 0  . Multiple Vitamin (MULTIVITAMIN) capsule Take 1 capsule by mouth daily.      . potassium chloride (MICRO-K) 10 MEQ CR capsule Take 2 capsules (20 mEq total)  by mouth daily. 60 capsule 3  . prochlorperazine (COMPAZINE) 10 MG tablet Take 1 tablet (10 mg total) by mouth every 6 (six) hours as needed for nausea or vomiting. 20 tablet 0  . sertraline (ZOLOFT) 50 MG tablet Take 1 tablet (50 mg total) by mouth daily. 30 tablet 3  . triamterene-hydrochlorothiazide (DYAZIDE) 37.5-25 MG capsule Take 1 each (1 capsule total) by mouth every morning. 30 capsule 0   No current facility-administered medications for this visit.    Facility-Administered Medications Ordered in Other Visits  Medication Dose Route Frequency Provider Last Rate Last Dose  . 0.9 %  sodium chloride infusion   Intravenous Once Tesha Archambeau, [name], MD      . heparin lock flush 100 unit/mL  500 Units Intracatheter Once PRN Averey Trompeter, [name], MD      . PACLitaxel-protein bound (ABRAXANE) chemo infusion 200 mg  100 mg/m2 (Treatment Plan Recorded) Intravenous Once Char Feltman, [name], MD      . prochlorperazine (COMPAZINE) tablet 10 mg  10 mg Oral Once Cyntha Brickman, [name], MD      . sodium chloride 0.9 % injection 10 mL  10 mL Intravenous PRN Tarez Bowns, [name], MD   10 mL at 03/04/14 1421  . sodium chloride flush (NS) 0.9 % injection 10 mL  10 mL Intracatheter PRN Zahirah Cheslock, [name], MD   10 mL at 03/21/18 1616  . sodium chloride flush (NS) 0.9 % injection 10 mL  10 mL Intracatheter PRN Jerimie Mancuso, [name], MD        OBJECTIVE: Middle-aged African-American woman who appears stated age  For today's vital signs please see the chemotherapy flow sheet  There were no vitals filed for this visit.   There is no height or weight on file to calculate BMI.    ECOG FS: 1 There were no vitals filed for this visit.  Margot ask back to lesions look softer and less inflamed.  Her right upper extremity lymphedema appears decreased; compression sleeve in place     Right Arm and Back 01/17/2018      Photo 01 /23 /2019     Photo 07/04/2017     Photo 04/06/2017    LAB RESULTS:.  Lab  Results  Component Value Date   WBC 5.9 04/18/2018   NEUTROABS 4.2 04/18/2018   HGB 11.7 04/18/2018   HCT 36.5 04/18/2018   MCV 83.1 04/18/2018   PLT 211 04/18/2018      Chemistry      Component Value  Date/Time   NA 141 04/18/2018 1124   NA 142 11/09/2017 1214   K 3.6 04/18/2018 1124   K 3.9 11/09/2017 1214   CL 102 04/18/2018 1124   CL 101 03/07/2013 1411   CO2 29 04/18/2018 1124   CO2 32 (H) 11/09/2017 1214   BUN 10 04/18/2018 1124   BUN 19.1 11/09/2017 1214   CREATININE 0.99 04/18/2018 1124   CREATININE 0.91 02/13/2018 1354   CREATININE 1.0 11/09/2017 1214      Component Value Date/Time   CALCIUM 9.4 04/18/2018 1124   CALCIUM 9.1 11/09/2017 1214   ALKPHOS 96 04/18/2018 1124   ALKPHOS 120 11/09/2017 1214   AST 14 04/18/2018 1124   AST 21 02/13/2018 1354   AST 30 11/09/2017 1214   ALT <6 04/18/2018 1124   ALT 15 02/13/2018 1354   ALT 19 11/09/2017 1214   BILITOT 0.3 04/18/2018 1124   BILITOT 0.3 02/13/2018 1354   BILITOT <0.22 11/09/2017 1214       STUDIES: No results found.  ASSESSMENT:  62 y.o. [name] woman with stage IV breast cancer manifested chiefly by loco-regional nodal disease (neck, chest) and skin involvement, without liver, bone, or brain metastases documented   (1) Status post right upper inner quadrant lumpectomy and sentinel lymph node sampling 03/04/2002 for 2 separate foci of invasive ductal carcinoma, mpT2 pN1 or stage IIB, both foci grade 3, both estrogen and progesterone receptor positive, both HercepTest 3+, Mib-1 56%  (2) Reexcision for margins 05/27/2002 showed no residual cancer in the breast.  (3) The patient refused adjuvant systemic therapy.  (4) Adjuvant radiation treatment completed 08/14/2002.  (5) recurrence in the right breast in 02/2004 showing a morphologically different tumor, again grade 3, again estrogen and progesterone receptor negative, with an MIB-1 of 14% and Herceptest 3+.  (5) Between 03/2004 and 07/2004 she  received dose dense Doxorubicin/Cyclophosphamide x 4 given with trastuzumab, followed by weekly Docetaxel x 8, again given with trastuzumab.  (6) Right mastectomy 07/13/2004 showed scattered microscopic foci of residual disease over an area  greater than 5 cm. Margins were negative.  (7) Postoperative Docetaxel continued until 09/2004.  (8) Trastuzumab (Herceptin) given 08/2004 through 01/2012 with some brief interruptions.  (9) Isolated right cervical nodal recurrence 10/2005, treated with radiation to the right supraclavicular area (total 41.5 gray) completed 12/29/2005.  (10) Navelbine given together with Herceptin  11/2005 through 03/2006.  (9) Left mastectomy 02/13/2006 for ductal carcinoma in situ, grade 2, estrogen and progesterone receptor negative, with negative margins; 0 of 3 lymph nodes involved  (10) treated with Lapatinib and Capecitabine before 10/2009, for an unclear duration and with unclear results (cannot locate data on chart review).  (11) Status post right supraclavicular lymph node biopsy 09/2010 again positive for an invasive ductal carcinoma, estrogen and progesterone receptor negative, HER-2 positive by CISH with a ratio 4.25.  (12) Navelbine given together with Herceptin between 05/2011 and 11/2011.  (13) Carboplatin/ Gemcitabine/ Herceptin given for 2 cycles, in 12/2011 and 01/2012.  (14) TDM-1 ([place]) started 02/2012. Last dose 10/02/2013 after which the patient discontinued treatment at her own discretion. Echo on December 2014 showed a well preserved ejection fraction.  (15) Deep vein thrombosis of the right upper extremity documented 04/20/2011.  She completed anticoagulant therapy with Coumadin on 03/25/2013.  (16) Chronic right upper extremity lymphedema, not responsive to aggressive PT  (a) biopsy of denuded area 04/23/2015 read as dermatofibroma (discordant)  (b) deeper cuts of 04/23/2015 biopsy suggest angiosarcoma  (17) Right chest port-a-cath  removal due to infection on 01/28/2013. Left chest Port-A-Cath placed on 04/08/2013; being flushed every 6 weeks  RIGHT UPPER EXTREMITY ANGIOSARCOMA VS BREAST CANCER: August 2016 (18) [facility] August 2016 with paclitaxel, trastuzumab and pertuzumab 1 cycle, afterwards referred to hospice  (19) under the care of hospice of [facility] August 2016 to 05/08/2016, when the patient opted for a second try at chemotherapy  (20) started low-dose Abraxane, trastuzumab and pertuzumab 06/07/2016, to be repeated every 3 weeks.   (a) pretreatment echocardiogram 06/06/2016 showed a 60-65% ejection fraction  (b) significant compliance problems compromise response to treatment  (c)  echocardiogram 02/20/17 LVEF 55-60%  (d) Abraxane discontinued 07/04/2017 because of possible neuropathy symptoms  (e) Abraxane resumed 09/27/2017  (f) echocardiogram 07/20/2017 showed an ejection fraction of 60-65%  (g) echo 01/22/2018 showed an ejection fraction of 50-55%  (21) Chronic pain secondary to known metastatic disease  (a) patient signed pain contract 10/19/2016  (b) as of 11/16/2016 receives her pain medications from Dr [name]  (c)  Dr [name] no longer able to prescribe as of February 2019  (d) patient receiving pain medications through [facility] as of April 2019   PLAN:  [name] continues to tolerate her chemo/immunotherapy remarkably well, and when she receives it in a regular basis it continues to work.  I was impressed today by the decrease in swelling of the back lesions, which also show much less inflammation.  The plan accordingly is to continue this indefinitely.  I was impressed at how well she looks today.  I think it would be helpful at this point to obtain a CT scan of the chest.  She is going to see me with her next treatment, July 3, and we will try to do that shortly before that visit.  She is now [facility] and she seems to be very satisfied with the care she is receiving through them.  She wanted me to keep her eye doctor at [facility] in the loop if we ever need to do any changes.  I will certainly be glad to do that  Otherwise she knows to call for any issues that may develop before her next visit here.   [name], [name], MD  04/18/18 12:50 PM Medical Oncology and Hematology [facility] [address] [place], [place] [address] Tel. [phone]    Fax. [phone]  Alice Rieger, am acting as scribe for Chauncey Cruel MD.  I, Lurline Del MD, have reviewed the above documentation for accuracy and completeness, and I agree with the above.

## 2018-04-16 NOTE — Patient Outreach (Signed)
[facility]) Care Management  04/16/2018  [name] Jan 04, 1956 [id number]  Assessment-CSW completed outreach attempt today. CSW unable to reach patient successfully. CSW left a HIPPA compliant voice message encouraging patient to return call once available. [facility] CSW will mail unsuccessfully outreach letter to patient at this time.  Plan-CSW will await return call or complete an additional outreach if needed.  [name], BSW, MSW, [email or web address] Phone: [phone] Fax: [phone]

## 2018-04-17 ENCOUNTER — Other Ambulatory Visit: Payer: Self-pay

## 2018-04-17 NOTE — Patient Outreach (Addendum)
[facility] [facility]) Care Management  04/17/2018  Laina Guerrieri [birth year] [id number]   RNCM received message from telephonic care coordinator that client left a message with her stating that she did not want a home visit. She request RNCM call client.  RNCM called to speak with client, but client was not in. HIPPA compliant voice message left.  Plan: follow up call within the next 3-4 business days.  Thea Silversmith, RN, MSN, [place] Coordinator Cell: [phone]  Addendum: outreach letter sent.

## 2018-04-18 ENCOUNTER — Inpatient Hospital Stay: Payer: Commercial Managed Care - PPO | Attending: Oncology

## 2018-04-18 ENCOUNTER — Inpatient Hospital Stay (HOSPITAL_BASED_OUTPATIENT_CLINIC_OR_DEPARTMENT_OTHER): Payer: Commercial Managed Care - PPO | Admitting: Oncology

## 2018-04-18 ENCOUNTER — Inpatient Hospital Stay: Payer: Commercial Managed Care - PPO

## 2018-04-18 VITALS — BP 113/63 | HR 64 | Temp 98.5°F | Resp 16

## 2018-04-18 DIAGNOSIS — Z171 Estrogen receptor negative status [ER-]: Secondary | ICD-10-CM

## 2018-04-18 DIAGNOSIS — F17210 Nicotine dependence, cigarettes, uncomplicated: Secondary | ICD-10-CM | POA: Diagnosis not present

## 2018-04-18 DIAGNOSIS — Z170 Estrogen receptor positive status [ER+]: Secondary | ICD-10-CM

## 2018-04-18 DIAGNOSIS — Z923 Personal history of irradiation: Secondary | ICD-10-CM

## 2018-04-18 DIAGNOSIS — C4911 Malignant neoplasm of connective and soft tissue of right upper limb, including shoulder: Secondary | ICD-10-CM

## 2018-04-18 DIAGNOSIS — I10 Essential (primary) hypertension: Secondary | ICD-10-CM | POA: Diagnosis not present

## 2018-04-18 DIAGNOSIS — Z5112 Encounter for antineoplastic immunotherapy: Secondary | ICD-10-CM | POA: Diagnosis not present

## 2018-04-18 DIAGNOSIS — G893 Neoplasm related pain (acute) (chronic): Secondary | ICD-10-CM

## 2018-04-18 DIAGNOSIS — C50211 Malignant neoplasm of upper-inner quadrant of right female breast: Secondary | ICD-10-CM

## 2018-04-18 DIAGNOSIS — C792 Secondary malignant neoplasm of skin: Secondary | ICD-10-CM | POA: Insufficient documentation

## 2018-04-18 DIAGNOSIS — Z79899 Other long term (current) drug therapy: Secondary | ICD-10-CM | POA: Diagnosis not present

## 2018-04-18 DIAGNOSIS — C778 Secondary and unspecified malignant neoplasm of lymph nodes of multiple regions: Secondary | ICD-10-CM | POA: Diagnosis not present

## 2018-04-18 DIAGNOSIS — Z5111 Encounter for antineoplastic chemotherapy: Secondary | ICD-10-CM | POA: Diagnosis not present

## 2018-04-18 DIAGNOSIS — Z95828 Presence of other vascular implants and grafts: Secondary | ICD-10-CM

## 2018-04-18 LAB — COMPREHENSIVE METABOLIC PANEL
ALT: <6 U/L (ref 0–55)
AST: 14 U/L (ref 5–34)
Albumin: 3.7 g/dL (ref 3.5–5.0)
Alkaline Phosphatase: 96 U/L (ref 40–150)
Anion gap: 10 (ref 3–11)
BUN: 10 mg/dL (ref 7–26)
CO2: 29 mmol/L (ref 22–29)
Calcium: 9.4 mg/dL (ref 8.4–10.4)
Chloride: 102 mmol/L (ref 98–109)
Creatinine, Ser: 0.99 mg/dL (ref 0.60–1.10)
GFR calc Af Amer: >60 mL/min (ref >60)
GFR calc non Af Amer: 60 mL/min — ABNORMAL LOW (ref >60)
Glucose, Bld: 118 mg/dL (ref 70–140)
Potassium: 3.6 mmol/L (ref 3.5–5.1)
Sodium: 141 mmol/L (ref 136–145)
Total Bilirubin: 0.3 mg/dL (ref 0.2–1.2)
Total Protein: 7.7 g/dL (ref 6.4–8.3)

## 2018-04-18 LAB — CBC WITH DIFFERENTIAL/PLATELET
Basophils Absolute: 0 10*3/uL (ref 0.0–0.1)
Basophils Relative: 1 %
Eosinophils Absolute: 0.1 10*3/uL (ref 0.0–0.5)
Eosinophils Relative: 2 %
HCT: 36.5 % (ref 34.8–46.6)
Hemoglobin: 11.7 g/dL (ref 11.6–15.9)
Lymphocytes Relative: 18 %
Lymphs Abs: 1 10*3/uL (ref 0.9–3.3)
MCH: 26.7 pg (ref 25.1–34.0)
MCHC: 32.2 g/dL (ref 31.5–36.0)
MCV: 83.1 fL (ref 79.5–101.0)
Monocytes Absolute: 0.5 10*3/uL (ref 0.1–0.9)
Monocytes Relative: 8 %
Neutro Abs: 4.2 10*3/uL (ref 1.5–6.5)
Neutrophils Relative %: 71 %
Platelets: 211 10*3/uL (ref 145–400)
RBC: 4.39 MIL/uL (ref 3.70–5.45)
RDW: 15.7 % — ABNORMAL HIGH (ref 11.2–14.5)
WBC: 5.9 10*3/uL (ref 3.9–10.3)

## 2018-04-18 MED ORDER — SODIUM CHLORIDE 0.9 % IV SOLN
6.0000 mg/kg | Freq: Once | INTRAVENOUS | Status: AC
  Administered 2018-04-18: 525 mg via INTRAVENOUS
  Filled 2018-04-18: qty 25

## 2018-04-18 MED ORDER — SODIUM CHLORIDE 0.9 % IV SOLN
420.0000 mg | Freq: Once | INTRAVENOUS | Status: AC
  Administered 2018-04-18: 420 mg via INTRAVENOUS
  Filled 2018-04-18: qty 14

## 2018-04-18 MED ORDER — ACETAMINOPHEN 325 MG PO TABS
ORAL_TABLET | ORAL | Status: AC
  Filled 2018-04-18: qty 2

## 2018-04-18 MED ORDER — DIPHENHYDRAMINE HCL 25 MG PO CAPS
25.0000 mg | ORAL_CAPSULE | Freq: Once | ORAL | Status: AC
  Administered 2018-04-18: 25 mg via ORAL

## 2018-04-18 MED ORDER — DIPHENHYDRAMINE HCL 25 MG PO CAPS
ORAL_CAPSULE | ORAL | Status: AC
Start: 2018-04-18 — End: ?
  Filled 2018-04-18: qty 1

## 2018-04-18 MED ORDER — PROCHLORPERAZINE MALEATE 10 MG PO TABS
ORAL_TABLET | ORAL | Status: AC
  Filled 2018-04-18: qty 1

## 2018-04-18 MED ORDER — SODIUM CHLORIDE 0.9 % IJ SOLN
10.0000 mL | INTRAMUSCULAR | Status: DC | PRN
  Administered 2018-04-18: 10 mL via INTRAVENOUS
  Filled 2018-04-18: qty 10

## 2018-04-18 MED ORDER — PACLITAXEL PROTEIN-BOUND CHEMO INJECTION 100 MG
100.0000 mg/m2 | Freq: Once | INTRAVENOUS | Status: AC
  Administered 2018-04-18: 200 mg via INTRAVENOUS
  Filled 2018-04-18: qty 40

## 2018-04-18 MED ORDER — ACETAMINOPHEN 325 MG PO TABS
650.0000 mg | ORAL_TABLET | Freq: Once | ORAL | Status: AC
  Administered 2018-04-18: 650 mg via ORAL

## 2018-04-18 MED ORDER — SODIUM CHLORIDE 0.9% FLUSH
10.0000 mL | INTRAVENOUS | Status: DC | PRN
  Administered 2018-04-18: 10 mL
  Filled 2018-04-18: qty 10

## 2018-04-18 MED ORDER — PROCHLORPERAZINE MALEATE 10 MG PO TABS
10.0000 mg | ORAL_TABLET | Freq: Once | ORAL | Status: AC
  Administered 2018-04-18: 10 mg via ORAL

## 2018-04-18 MED ORDER — SODIUM CHLORIDE 0.9 % IV SOLN
Freq: Once | INTRAVENOUS | Status: AC
Start: 2018-04-18 — End: 2018-04-18
  Administered 2018-04-18: 12:00:00 via INTRAVENOUS

## 2018-04-18 MED ORDER — HEPARIN SOD (PORK) LOCK FLUSH 100 UNIT/ML IV SOLN
500.0000 [IU] | Freq: Once | INTRAVENOUS | Status: AC | PRN
Start: 2018-04-18 — End: 2018-04-18
  Administered 2018-04-18: 500 [IU]
  Filled 2018-04-18: qty 5

## 2018-04-18 NOTE — Patient Instructions (Signed)
[facility] Discharge Instructions for Patients Receiving Chemotherapy  Today you received the following chemotherapy agents: Herceptin, Perjeta, and Abraxane  To help prevent nausea and vomiting after your treatment, we encourage you to take your nausea medication as directed   If you develop nausea and vomiting that is not controlled by your nausea medication, call the clinic.   BELOW ARE SYMPTOMS THAT SHOULD BE REPORTED IMMEDIATELY:  *FEVER GREATER THAN 100.5 F  *CHILLS WITH OR WITHOUT FEVER  NAUSEA AND VOMITING THAT IS NOT CONTROLLED WITH YOUR NAUSEA MEDICATION  *UNUSUAL SHORTNESS OF BREATH  *UNUSUAL BRUISING OR BLEEDING  TENDERNESS IN MOUTH AND THROAT WITH OR WITHOUT PRESENCE OF ULCERS  *URINARY PROBLEMS  *BOWEL PROBLEMS  UNUSUAL RASH Items with * indicate a potential emergency and should be followed up as soon as possible.  Feel free to call the clinic should you have any questions or concerns. The clinic phone number is [phone].  Please show the CHEMO ALERT CARD at check-in to the Emergency Department and triage nurse.   

## 2018-04-19 ENCOUNTER — Ambulatory Visit: Payer: Self-pay

## 2018-04-19 ENCOUNTER — Other Ambulatory Visit: Payer: Self-pay

## 2018-04-19 NOTE — Patient Outreach (Signed)
[facility]) Care Management  04/19/2018  Jaidy Cottam [birth year] [id number]   Referral received 04/11/18 per [name]. 62 year old with history of malignant neoplasm of right breast, chronic pain, HTN, GERD, DVT. Client is requesting assistance with finding a primary care physician and pain clinic referral. Client left message with telephonic care coordinator that she did not want to complete home visit.  RNCM called to follow up with client. No answer. HIPPA compliant message left on voicemail. 2nd outreach call to discuss client's care management needs.  Plan: continue outreach attempt within the next 3-4 business days.  Thea Silversmith, RN, MSN, [place] Coordinator Cell: [phone]

## 2018-04-23 ENCOUNTER — Other Ambulatory Visit: Payer: Self-pay

## 2018-04-23 NOTE — Patient Outreach (Signed)
[facility]) Care Management  04/23/2018  [name] 1956-06-11 [id number]   Referral received 04/11/18 per [facility]. Per telephonic care coordinator-client is requesting assistance with finding a primary care physician and pain clinic.  RNCM called to follow up. Two patient identifiers confirmed. Client reports she already has a pain clinic doctor. She states she is being seen at the [facility] clinic in [place]. She states she would like to find a primary care doctor. [name] states she did not receive the telephone numbers for the resources previously provided by the oncology social worker. RNCM reinforced the list previously given to her.  In addition RNCM provided the contact number for each agency:  [place] [phone] [facility] [phone] [facility] physician referral program [phone]  [facility] informed client it is important to choose a provider who will take all of her insurances. per client she has UMR, Medicare and Medicaid. Client states that UMR is her primary insurance on her husband's card. [name] states she will call the number on the back of her insurance card also.  She declines home visits. She declines continued involvement in Care management services, she declines to answer assessment questions stating, "I think I will be alright".   RNCM provided contact number and encouraged client to call if her needs change.  Plan: close case.  Thea Silversmith, RN, MSN, [place] Coordinator Cell: [phone]

## 2018-04-26 ENCOUNTER — Other Ambulatory Visit: Payer: Self-pay | Admitting: *Deleted

## 2018-04-26 ENCOUNTER — Other Ambulatory Visit: Payer: Self-pay | Admitting: Licensed Clinical Social Worker

## 2018-04-26 MED ORDER — TRIAMTERENE-HCTZ 37.5-25 MG PO CAPS: 1.0000 | ORAL_CAPSULE | Freq: Every morning | ORAL | 3 refills | Status: DC

## 2018-04-26 NOTE — Patient Outreach (Signed)
[facility]) Care Management  04/26/2018  [name] 13-Apr-1956 [id number]  Assessment-CSW completed third and final outreach attempt today. CSW unable to reach patient successfully. CSW left a HIPPA compliant voice message encouraging patient to return call once available if still interested in [place] as care team providers have been unable to maintain or establish contact with patient. [facility] CSW will close case at this time. Patient has no PCP on file and [facility] CSW unable to update PCP on case closure.   Plan-[facility] CSW will complete case closure at this time.  [name], BSW, MSW, [email or web address] Phone: [phone] Fax: [phone]

## 2018-05-01 ENCOUNTER — Other Ambulatory Visit: Payer: Self-pay

## 2018-05-01 MED ORDER — PREDNISONE 50 MG PO TABS: ORAL_TABLET | ORAL | 0 refills | Status: DC

## 2018-05-01 NOTE — Progress Notes (Signed)
Called pt to let her know that we will be sending out a script for 50mg  prednisone to her pharmacy, as premeds for her CT this coming Thursday. Gave pt instructions on when to take prednisone dose. (13hrs, 7hrs, 1hr prior to CT appt). Pt verbalized understanding. Also added that pt will need to take 50mg  benadryl 1 hr prior to CT scan. No further questions at this time.

## 2018-05-02 ENCOUNTER — Telehealth: Payer: Self-pay | Admitting: *Deleted

## 2018-05-02 NOTE — Telephone Encounter (Signed)
This RN returned call to pt per note left by nurse who spoke with her on 6/25 - [name] stated issues of " not being able to sleep "- " [name] took me off the xanax cold [place] " " I have tried Benadryl,Atarax,and Melatonin and they are not working "  " I am having anxiety "  Noted this RN noted in MD dictation pt is receiving pain medications and xanax thru Dr Berkley Harvey ( verified as well per [place] controlled substance data base ).  Per phone discussion - [name] stated she went to the above doctor " but I don't know if I want to go back they charged me $200 and they are in [place] and I don't feel comfortable driving that far - I got an oncologist and I don't need to go to this other doctor "  This RN discussed above including due to established care under Dr Berkley Harvey with documented prescriptions of controlled substances this office cannot prescribed any refills- she needs to return to Dr Milagros Reap and discuss with him her concerns.  [name] states she spoke with Dr Berkley Harvey yesterday " but they don't want to prescribed anymore xanax because they are trying to detox me but I am not sleeping - and I used the benadryl and sleep for like 2 hours but then wake back up and I don't want to take more benadryl - cause I don't know the side effects "  [name] stated the xanax she received " were duds and no good - they didn't work - I got them from [employer] "  This RN informed pt per database she received them from [employer].  [name] did inquire " why are giving me a hard time - I am not asking for anything "  This RN validated [name]'s statement but also informed her due to communication with nurse yesterday the need for further follow up. Presently - this RN informed her -she needs to obtain her narcotics and xanax thru Dr Berkley Harvey or discontinue care there. She should obtain a letter from Dr Berkley Harvey verifying she is discontinuing care under him for appropriate care to be provided by  another MD.  [name] verbalized understanding.  Note call ended amicably with pt stating " Happy 4th of July " and talking about her upcoming birthday.

## 2018-05-03 ENCOUNTER — Ambulatory Visit (HOSPITAL_COMMUNITY)
Admission: RE | Admit: 2018-05-03 | Discharge: 2018-05-03 | Disposition: A | Discharge status: Home / Self Care | Payer: Commercial Managed Care - PPO | Source: Ambulatory Visit | Attending: Oncology | Admitting: Oncology

## 2018-05-03 DIAGNOSIS — Z9011 Acquired absence of right breast and nipple: Secondary | ICD-10-CM | POA: Diagnosis not present

## 2018-05-03 DIAGNOSIS — R918 Other nonspecific abnormal finding of lung field: Secondary | ICD-10-CM | POA: Insufficient documentation

## 2018-05-03 DIAGNOSIS — C4911 Malignant neoplasm of connective and soft tissue of right upper limb, including shoulder: Secondary | ICD-10-CM | POA: Diagnosis present

## 2018-05-03 DIAGNOSIS — C50211 Malignant neoplasm of upper-inner quadrant of right female breast: Secondary | ICD-10-CM | POA: Diagnosis not present

## 2018-05-03 DIAGNOSIS — Z170 Estrogen receptor positive status [ER+]: Secondary | ICD-10-CM | POA: Diagnosis not present

## 2018-05-03 MED ORDER — IOHEXOL 300 MG/ML  SOLN
75.0000 mL | Freq: Once | INTRAMUSCULAR | Status: AC | PRN
  Administered 2018-05-03: 75 mL via INTRAVENOUS

## 2018-05-03 MED ORDER — HEPARIN SOD (PORK) LOCK FLUSH 100 UNIT/ML IV SOLN
INTRAVENOUS | Status: AC
  Filled 2018-05-03: qty 5

## 2018-05-03 MED ORDER — HEPARIN SOD (PORK) LOCK FLUSH 100 UNIT/ML IV SOLN
500.0000 [IU] | Freq: Once | INTRAVENOUS | Status: AC
  Administered 2018-05-03: 500 [IU] via INTRAVENOUS

## 2018-05-12 ENCOUNTER — Ambulatory Visit (HOSPITAL_COMMUNITY)
Admission: EM | Admit: 2018-05-12 | Discharge: 2018-05-12 | Disposition: A | Discharge status: Home / Self Care | Payer: Commercial Managed Care - PPO | Attending: Physician Assistant | Admitting: Physician Assistant

## 2018-05-12 ENCOUNTER — Encounter (HOSPITAL_COMMUNITY): Payer: Self-pay

## 2018-05-12 DIAGNOSIS — W19XXXA Unspecified fall, initial encounter: Secondary | ICD-10-CM | POA: Diagnosis not present

## 2018-05-12 DIAGNOSIS — M79601 Pain in right arm: Secondary | ICD-10-CM | POA: Diagnosis not present

## 2018-05-12 DIAGNOSIS — M25531 Pain in right wrist: Secondary | ICD-10-CM

## 2018-05-12 MED ORDER — MELOXICAM 7.5 MG PO TABS: 7.5000 mg | ORAL_TABLET | Freq: Every day | ORAL | 0 refills | Status: DC

## 2018-05-12 NOTE — ED Triage Notes (Addendum)
Pt presents for slipping and falling today. Complains of pain in arms, back and right foot. Denies any loc or hitting her head

## 2018-05-12 NOTE — ED Provider Notes (Signed)
[place]    CSN: [id number] Arrival date & time: 05/12/18  1657     History   Chief Complaint Chief Complaint  Patient presents with  . Fall    HPI [name] is a 62 y.o. female.   62 year old female comes in for evaluation after tripping and falling today.  States she landed on the right side of her body.  Denies head injury, loss of consciousness.  She states that she had pain of the right arm, back, foot during triage.  However, during evaluation, only complaining of right arm pain, stating that is where she landed on. States pain from the elbow to the to the wrist.  She has full range of motion of shoulder, elbow, wrist.  States "I know is not broken, I just want something for pain".  Denies numbness, tingling, swelling.  Has not taken anything for the symptoms.     Past Medical History:  Diagnosis Date  . Breast cancer ([place])   . DVT (deep venous thrombosis) ([place]) 10/07/2011  . Fever 02/06/2018  . Hypertension   . Radiation 11/29/2005-12/29/2005   right supraclavicular area 4147 cGy  . Radiation 06/26/02-08/14/02   right breast 5040 cGy, tumor bed boosted to 1260 cGy  . Rheumatic fever     Patient Active Problem List   Diagnosis Date Noted  . Acute lower UTI 02/06/2018  . Altered mental status 02/06/2018  . Bacteremia 02/06/2018  . Polypharmacy 02/06/2018  . Goals of care, counseling/discussion 09/05/2017  . Pneumonia 01/25/2017  . CAP (community acquired pneumonia) 01/23/2017  . Port catheter in place 05/30/2016  . 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 Chronic pain 04/13/2015  . Left arm pain 08/18/2014  . Malignant neoplasm of upper-inner quadrant of right breast in female, estrogen receptor positive ([place]) 10/03/2013  . Post-lymphadenectomy lymphedema of arm 08/15/2013  . Port or reservoir infection 01/29/2013  . DVT (deep venous thrombosis) ([place]) 10/07/2011  . Chest pain 09/10/2009  . Secondary cardiomyopathy ([place])  09/02/2009  . Essential hypertension 02/26/2009  . GERD 02/26/2009    Past Surgical History:  Procedure Laterality Date  . BREAST SURGERY    . MASTECTOMY     bilateral  . PORT A CATH REVISION      OB History   None      Home Medications    Prior to Admission medications   Medication Sig Start Date End Date Taking? Authorizing Provider  Ascorbic Acid (VITAMIN C) 100 MG tablet Take 100 mg by mouth daily.      [provider]  calcium-vitamin D (OSCAL WITH D) 500-200 MG-UNIT per tablet Take 1 tablet by mouth daily.      [provider]  gabapentin (NEURONTIN) 300 MG capsule Take 1 capsule (300 mg total) by mouth daily. 11/27/17   Nicholas Lose, MD  hydrOXYzine (ATARAX/VISTARIL) 10 MG tablet Take 1 tablet (10 mg total) by mouth 3 (three) times daily as needed for anxiety. 02/08/18   Shelly Coss, MD  meloxicam (MOBIC) 7.5 MG tablet Take 1 tablet (7.5 mg total) by mouth daily. 05/12/18   Tasia Catchings, Amy V, PA-C  Multiple Vitamin (MULTIVITAMIN) capsule Take 1 capsule by mouth daily.      [provider]  potassium chloride (MICRO-K) 10 MEQ CR capsule Take 2 capsules (20 mEq total) by mouth daily. 08/15/17   [name], [name], MD  predniSONE (DELTASONE) 50 MG tablet Take 1 tablet (50mg ) 13 hrs prior to CT Scan, 7hrs, and 1hr  prior to CT Scan.  (Dye allergy protocol pre medication) 05/01/18   [name], [name], MD  prochlorperazine (COMPAZINE) 10 MG tablet Take 1 tablet (10 mg total) by mouth every 6 (six) hours as needed for nausea or vomiting. 02/08/18   Shelly Coss, MD  sertraline (ZOLOFT) 50 MG tablet Take 1 tablet (50 mg total) by mouth daily. 08/16/17   [name], [name], MD  triamterene-hydrochlorothiazide (DYAZIDE) 37.5-25 MG capsule Take 1 each (1 capsule total) by mouth every morning. 04/26/18   [name], [name], MD    Family History Family History  Problem Relation Age of Onset  . Pancreatic cancer Mother   . Kidney cancer Sister 31  . Breast cancer  Sister 54  . Breast cancer Other 30    Social History Social History   Tobacco Use  . Smoking status: Light Tobacco Smoker  . Smokeless tobacco: Never Used  Substance Use Topics  . Alcohol use: Yes    Comment: Rarely  . Drug use: No     Allergies   Pork-derived products; Tramadol; and Hydrocodone-acetaminophen   Review of Systems Review of Systems  Reason unable to perform ROS: See HPI as above.     Physical Exam Triage Vital Signs ED Triage Vitals [05/12/18 1718]  Enc Vitals Group     BP 112/63     Pulse Rate 93     Resp 18     Temp 98 F (36.7 C)     Temp src      SpO2 100 %     Weight      Height      Head Circumference      Peak Flow      Pain Score 5     Pain Loc      Pain Edu?      Excl. in [place]?    No data found.  Updated Vital Signs BP 112/63   Pulse 93   Temp 98 F (36.7 C)   Resp 18   SpO2 100%   Physical Exam  Constitutional: She is oriented to person, place, and time. She appears well-developed and well-nourished. No distress.  HENT:  Head: Normocephalic and atraumatic.  Eyes: Pupils are equal, round, and reactive to light. Conjunctivae are normal.  Cardiovascular: Normal rate, regular rhythm and normal heart sounds. Exam reveals no gallop and no friction rub.  No murmur heard. Pulmonary/Chest: Effort normal and breath sounds normal. No accessory muscle usage or stridor. No respiratory distress. She has no decreased breath sounds. She has no wheezes. She has no rhonchi. She has no rales.  Musculoskeletal:  No obvious swelling, erythema, increased warmth, contusion seen. No tenderness to palpation of shoulder, right upper arm. Diffuse tenderness to palpation of elbow, forearm, wrist, fingers. Full ROM of shoulder, elbow, wrist, fingers. Strength normal and equal bilaterally. Sensation intact and equal bilaterally. Radial pulse 2+, cap refill <2s  Neurological: She is alert and oriented to person, place, and time.  Skin: Skin is warm and  dry. She is not diaphoretic.    UC Treatments / Results  Labs (all labs ordered are listed, but only abnormal results are displayed) Labs Reviewed - No data to display  EKG None  Radiology No results found.  Procedures Procedures (including critical care time)  Medications Ordered in UC Medications - No data to display  Initial Impression / Assessment and Plan / UC Course  I have reviewed the triage vital signs and the nursing notes.  Pertinent labs &  imaging results that were available during my care of the patient were reviewed by me and considered in my medical decision making (see chart for details).    Patient with diffuse tenderness to palpation of right elbow, forearm, wrist, fingers.  However, she has full range of motion with normal strength bilaterally.  No indications for x-ray right now.  Return precautions given. Offered Toradol injection in office today, patient declined.  Will have patient start Mobic, ice compress, elevation, wrist splint during activity.  Patient expresses understanding and agrees to plan.    Final Clinical Impressions(s) / UC Diagnoses   Final diagnoses:  Fall, initial encounter  Right arm pain    ED Prescriptions    Medication Sig Dispense Auth. Provider   meloxicam (MOBIC) 7.5 MG tablet Take 1 tablet (7.5 mg total) by mouth daily. 15 tablet Tobin Chad, [place] 05/12/18 Vernelle Emerald

## 2018-05-12 NOTE — Discharge Instructions (Signed)
As discussed, history and exam not worrisome for bone breaks, so x-rays deferred today.  Start Mobic to help with the pain and inflammation.  Ice compress, elevation will also help with the pain.  Wrist splint during activity.  Follow-up with PCP for further evaluation if symptoms not improving.

## 2018-05-15 NOTE — Progress Notes (Signed)
[place]  [phone] [phone]     ID: [name]   DOB: 12/12/1955  MR#: [id number]  ZMO#:[id number]  Patient Care Team: Patient, No Pcp Per as PCP - General (General Practice) Damani Kelemen, [name], MD as Consulting Physician (Oncology) [name], MD as Consulting Physician (Radiation Oncology) Stark Klein, MD as Consulting Physician (General Surgery) [name], [name], MD as Consulting Physician (Cardiology) [name], [name] 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 She receives trastuzumab and Pertuzumab, every 28 days, together with Abraxane, with treatment due today. She notes that she initially feels well following treatment, however several days following, she had increased nausea, which is new. She contributes her nausea to a possible virus.  Her most recent echocardiogram on 01/22/2018 showed an ejection fraction in the 50-55% range.  We are now doing echocardiograms every 6 months or so.  On 05/03/2018 she underwent a CT chest with contrast, showing no findings for metastatic disease involving the chest. No mediastinal/ hilar lymphadenopathy or metastatic pulmonary nodules. Stable surgical changes from a right mastectomy. No findings to suggest chest wall recurrence or metastatic disease. Subcutaneous nodularity involving the upper right posterior chest. This appears fairly stable. No new/ progressive findings. Recommend clinical correlation. No significant upper abdominal findings.   On 05/12/2018 [name] presented to the emergency room following a fall.  There was no evidence of fracture.  She was treated with Mobic.   Ice compress elevation and wrist splint were suggested.  [name] also has a chronic pain syndrome.  This is currently being managed by Dr [name] and review of [facility] today shows that he is the only one prescribing pain medicine to her as of 04/03/2018.  Also [place] is using a single pharmacy since that time.  This is a significant improvement on prior.   REVIEW OF SYSTEMS: [name] reports that for exercise, she runs around with her 3 year old grandson, [name]. She has been doing well overall. She has been enjoying her time with her daughter and her 85 year old grandson. She takes her grandson to school daily. She recently went to a water park. She recently fell 4 days ago due to a mechanical fall while going up the steps. She had bilateral arm pain and she was evaluated at an Urgent Care. She has right finger pad neuropathy which she treats with gabapentin. She notes that her peripheral neuropathy with her BLE is resolved at this time. She denies unusual headaches, visual changes, nausea, vomiting, or dizziness. There has been no unusual cough, phlegm production, or pleurisy. This been no change in bowel or bladder habits. She denies unexplained fatigue or unexplained weight loss, bleeding, rash, or fever. A detailed review of systems was otherwise stable.      BREAST CANCER HISTORY: From the original intake note:  The patient originally presented in May 2003 when she noticed a lump in the upper inner quadrant of her right breast in September 2003.  She sought attention and had a mammogram which showed an obvious carcinoma in the upper inner quadrant of the right breast, approximately 2 cm.  There were some enlarged lymph nodes in the axilla and an FNA done showed those consistent with malignant cells,  most likely an invasive ductal carcinoma.  At that point she was unsure of what to do and was referred by Dr. [name] for a discussion of her treatment options.  By biopsy, it was ER/PR  negative and HER-2 was 3+.  DNA index was 1.42.  We reiterated that it was most important to have her disease surgically addressed at that point and had recommended lumpectomy and axillary nodes to be addressed with which she followed through and on 04-03-02, Dr. [name] performed a right partial mastectomy and a right axillary lymph node dissection.  Final pathology revealed a 2.4 cm. high grade, Grade III invasive ductal carcinoma with an adjacent .8 cm. also high grade invasive ductal carcinoma which was felt to represent an intramammary metastasis rather than a second primary.  A smaller mass was just medial to the larger mass.  The smaller mass was associated with high grade DCIS component.  There was no definite lymphovascular invasion identified.  However, one of fourteen axillary lymph nodes did contain a 1.5 cm. metastatic deposit.  Postoperatively she did very well.  We reiterated the fact that she did need adjuvant chemotherapy, however, she refused and decided that she would pursue radiation.  She received radiation and completed that on 08-14-02. Her subsequent history is as detailed below.   PAST MEDICAL HISTORY: Past Medical History:  Diagnosis Date  . Breast cancer ([place])   . DVT (deep venous thrombosis) ([place]) 10/07/2011  . Fever 02/06/2018  . Hypertension   . Radiation 11/29/2005-12/29/2005   right supraclavicular area 4147 cGy  . Radiation 06/26/02-08/14/02   right breast 5040 cGy, tumor bed boosted to 1260 cGy  . Rheumatic fever     PAST SURGICAL HISTORY: Past Surgical History:  Procedure Laterality Date  . BREAST SURGERY    . MASTECTOMY     bilateral  . PORT A CATH REVISION      FAMILY HISTORY Family History  Problem Relation Age of Onset  . Pancreatic cancer Mother   . Kidney cancer Sister 29  . Breast cancer Sister 5  . Breast cancer Other 50  The patient's father died in his 58I  from complications of alcohol abuse. The patient's mother died in her 59s from pancreatic  cancer. The patient has 6 brothers, 2 sisters. Both her sisters have had breast cancer, both diagnosed after the age of 63. The patient has not had genetic testing so far.   GYNECOLOGIC HISTORY: Menarche age 66; first live birth age 75. She is GXP4. She is not sure when she stopped having periods. She never used hormone replacement.  SOCIAL HISTORY: The patient has worked in the past as a Psychologist, counselling. At home in addition to the patient are her husband [name], originally from [place], who works as a Administrator.  Also living in the home is her daughter [name] (she is studying to be a Marine scientist).  The other 2 children are [name], who has a college degree in psychology and works as a Probation officer; and [name], who lives in [place] and works as a Higher education careers adviser. Son [name] also sometimes stays in the home. In addition the patient has an aid who helps her almost daily.   ADVANCED DIRECTIVES: Not in place  HEALTH MAINTENANCE: Social History   Tobacco Use  . Smoking status: Light Tobacco Smoker  . Smokeless tobacco: Never Used  Substance Use Topics  . Alcohol use: Yes    Comment: Rarely  . Drug use: No     Colonoscopy:  Not  on file  PAP:  Not on file  Bone density:  Not on file  Lipid panel:  Not on file    Allergies  Allergen Reactions  . Pork-Derived Products Other (See Comments)    Pt says she just doesn't eat pork; has no reaction to pork products  . Tramadol Nausea Only  . Hydrocodone-Acetaminophen Itching and Rash    Current Outpatient Medications  Medication Sig Dispense Refill  . Ascorbic Acid (VITAMIN C) 100 MG tablet Take 100 mg by mouth daily.      . calcium-vitamin D (OSCAL WITH D) 500-200 MG-UNIT per tablet Take 1 tablet by mouth daily.      Marland Kitchen gabapentin (NEURONTIN) 300 MG capsule Take 1 capsule (300 mg total) by mouth daily. 90 capsule 3  . hydrOXYzine (ATARAX/VISTARIL) 10 MG tablet Take 1 tablet (10 mg total) by mouth 3 (three) times daily as needed for anxiety. 20  tablet 0  . meloxicam (MOBIC) 7.5 MG tablet Take 1 tablet (7.5 mg total) by mouth daily. 15 tablet 0  . Multiple Vitamin (MULTIVITAMIN) capsule Take 1 capsule by mouth daily.      . potassium chloride (MICRO-K) 10 MEQ CR capsule Take 2 capsules (20 mEq total) by mouth daily. 60 capsule 3  . predniSONE (DELTASONE) 50 MG tablet Take 1 tablet (53m) 13 hrs prior to CT Scan, 7hrs, and 1hr prior to CT Scan.  (Dye allergy protocol pre medication) 3 tablet 0  . prochlorperazine (COMPAZINE) 10 MG tablet Take 1 tablet (10 mg total) by mouth every 6 (six) hours as needed for nausea or vomiting. 20 tablet 0  . sertraline (ZOLOFT) 50 MG tablet Take 1 tablet (50 mg total) by mouth daily. 30 tablet 3  . triamterene-hydrochlorothiazide (DYAZIDE) 37.5-25 MG capsule Take 1 each (1 capsule total) by mouth every morning. 30 capsule 3   No current facility-administered medications for this visit.    Facility-Administered Medications Ordered in Other Visits  Medication Dose Route Frequency Provider Last Rate Last Dose  . sodium chloride 0.9 % injection 10 mL  10 mL Intravenous PRN Dwan Hemmelgarn, GVirgie Dad MD   10 mL at 03/04/14 1421  . sodium chloride flush (NS) 0.9 % injection 10 mL  10 mL Intracatheter PRN Keithen Capo, GVirgie Dad MD   10 mL at 03/21/18 1616    OBJECTIVE: Middle-aged African-American woman in no acute distress  For today's vital signs please see the chemotherapy flow sheet  Vitals:   05/16/18 1248  BP: 120/83  Pulse: 70  Resp: 18  Temp: 98.6 F (37 C)  SpO2: 100%     Body mass index is 31.63 kg/m.    ECOG FS: 1 Filed Weights   05/16/18 1248  Weight: 190 lb 1.6 oz (86.2 kg)    Sclerae unicteric, EOMs intact Oropharynx clear and moist No cervical or supraclavicular adenopathy Lungs no rales or rhonchi Heart regular rate and rhythm Abd soft, nontender, positive bowel sounds MSK no focal spinal tenderness, grade 1 chronic right upper extremity lymphedema Neuro: nonfocal, well oriented,  appropriate affect Breasts: Status post bilateral mastectomies.  The right arm, right shoulder, and right back lesions are all flatter and smaller as compared to prior   Photo 07/04/2017      LAB RESULTS:.  Lab Results  Component Value Date   WBC 7.3 05/16/2018   NEUTROABS 4.9 05/16/2018   HGB 11.1 (L) 05/16/2018   HCT 36.1 05/16/2018   MCV 86.0 05/16/2018   PLT 199 05/16/2018  Chemistry      Component Value Date/Time   NA 140 05/16/2018 1141   NA 142 11/09/2017 1214   K 4.1 05/16/2018 1141   K 3.9 11/09/2017 1214   CL 100 05/16/2018 1141   CL 101 03/07/2013 1411   CO2 32 05/16/2018 1141   CO2 32 (H) 11/09/2017 1214   BUN 13 05/16/2018 1141   BUN 19.1 11/09/2017 1214   CREATININE 0.97 05/16/2018 1141   CREATININE 0.91 02/13/2018 1354   CREATININE 1.0 11/09/2017 1214      Component Value Date/Time   CALCIUM 9.5 05/16/2018 1141   CALCIUM 9.1 11/09/2017 1214   ALKPHOS 127 (H) 05/16/2018 1141   ALKPHOS 120 11/09/2017 1214   AST 19 05/16/2018 1141   AST 21 02/13/2018 1354   AST 30 11/09/2017 1214   ALT 12 05/16/2018 1141   ALT 15 02/13/2018 1354   ALT 19 11/09/2017 1214   BILITOT 0.3 05/16/2018 1141   BILITOT 0.3 02/13/2018 1354   BILITOT <0.22 11/09/2017 1214       STUDIES: Ct Chest W Contrast  Result Date: 05/04/2018 CLINICAL DATA:  History of primary angiosarcoma of the right upper extremity. History of right breast cancer.  Ongoing chemotherapy. EXAM: CT CHEST WITH CONTRAST TECHNIQUE: Multidetector CT imaging of the chest was performed during intravenous contrast administration. CONTRAST:  38m OMNIPAQUE IOHEXOL 300 MG/ML  SOLN COMPARISON:  CT scan 01/21/2017 FINDINGS: Cardiovascular: The heart is normal in size. No pericardial effusion. Scattered atherosclerotic calcifications involving the thoracic aorta but no aneurysm or dissection. The branch vessels are patent. Scattered coronary artery calcifications. Mediastinum/Nodes: No mediastinal or hilar  mass or lymphadenopathy. The esophagus is grossly normal. Lungs/Pleura: Stable emphysematous changes and areas of pulmonary scarring. Stable radiation changes involving the anterior aspect of the right lung. No new or worrisome pulmonary lesions.  No acute pulmonary findings. Upper Abdomen: Fairly significant common bile duct dilatation without obvious cause. The common bile duct is dilated up to 15 mm. No obvious cause is demonstrated on this study. There is also mild intrahepatic biliary dilatation. No dilatation of the main pancreatic duct. Recommend correlation with liver function studies. Patient may need ERCP or MRCP for further evaluation. Musculoskeletal: No significant bony findings. Nodularity in the subcutaneous tissues of the upper right back with more focal nodular lesions which appears stable. IMPRESSION: 1. No findings for metastatic disease involving the chest. No mediastinal/hilar lymphadenopathy or metastatic pulmonary nodules. 2. Stable surgical changes from a right mastectomy. No findings to suggest chest wall recurrence or metastatic disease. 3. Subcutaneous nodularity involving the upper right posterior chest. This appears fairly stable. No new/progressive findings. Recommend clinical correlation. 4. No significant upper abdominal findings. Electronically Signed   By: PMarijo SanesM.D.   On: 05/04/2018 11:44    ASSESSMENT:  62y.o. [place] woman with stage IV breast cancer manifested chiefly by loco-regional nodal disease (neck, chest) and skin involvement, without liver, bone, or brain metastases documented   (1) Status post right upper inner quadrant lumpectomy and sentinel lymph node sampling 03/04/2002 for 2 separate foci of invasive ductal carcinoma, mpT2 pN1 or stage IIB, both foci grade 3, both estrogen and progesterone receptor positive, both HercepTest 3+, Mib-1 56%  (2) Reexcision for margins 05/27/2002 showed no residual cancer in the breast.  (3) The patient refused  adjuvant systemic therapy.  (4) Adjuvant radiation treatment completed 08/14/2002.  (5) recurrence in the right breast in 02/2004 showing a morphologically different tumor, again grade 3, again estrogen and progesterone  receptor negative, with an MIB-1 of 14% and Herceptest 3+.  (5) Between 03/2004 and 07/2004 she received dose dense Doxorubicin/Cyclophosphamide x 4 given with trastuzumab, followed by weekly Docetaxel x 8, again given with trastuzumab.  (6) Right mastectomy 07/13/2004 showed scattered microscopic foci of residual disease over an area  greater than 5 cm. Margins were negative.  (7) Postoperative Docetaxel continued until 09/2004.  (8) Trastuzumab (Herceptin) given 08/2004 through 01/2012 with some brief interruptions.  (9) Isolated right cervical nodal recurrence 10/2005, treated with radiation to the right supraclavicular area (total 41.5 gray) completed 12/29/2005.  (10) Navelbine given together with Herceptin  11/2005 through 03/2006.  (9) Left mastectomy 02/13/2006 for ductal carcinoma in situ, grade 2, estrogen and progesterone receptor negative, with negative margins; 0 of 3 lymph nodes involved  (10) treated with Lapatinib and Capecitabine before 10/2009, for an unclear duration and with unclear results (cannot locate data on chart review).  (11) Status post right supraclavicular lymph node biopsy 09/2010 again positive for an invasive ductal carcinoma, estrogen and progesterone receptor negative, HER-2 positive by CISH with a ratio 4.25.  (12) Navelbine given together with Herceptin between 05/2011 and 11/2011.  (13) Carboplatin/ Gemcitabine/ Herceptin given for 2 cycles, in 12/2011 and 01/2012.  (14) TDM-1 ([place]) started 02/2012. Last dose 10/02/2013 after which the patient discontinued treatment at her own discretion. Echo on December 2014 showed a well preserved ejection fraction.  (15) Deep vein thrombosis of the right upper extremity documented  04/20/2011.  She completed anticoagulant therapy with Coumadin on 03/25/2013.  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 Left chest Port-A-Cath placed on 04/08/2013; being flushed every 6 weeks  RIGHT UPPER EXTREMITY ANGIOSARCOMA VS BREAST CANCER: August 2016 (18) treated at [facility] of [place] August 2016 with paclitaxel, trastuzumab and pertuzumab 1 cycle, afterwards referred to hospice  (19) under the care of hospice of [facility] August 2016 to 05/08/2016, when the patient opted for a second try at chemotherapy  (20) started low-dose Abraxane, trastuzumab and pertuzumab 06/07/2016, to be repeated every 3 weeks.   (a) pretreatment echocardiogram 06/06/2016 showed a 60-65% ejection fraction  (b) significant compliance problems compromise response to treatment  (c)  echocardiogram 02/20/17 LVEF 55-60%  (d) Abraxane discontinued 07/04/2017 because of possible neuropathy symptoms  (e) Abraxane resumed 09/27/2017  (f) echocardiogram 07/20/2017 showed an ejection fraction of 60-65%  (g) echo 01/22/2018 showed an ejection fraction of 50-55%  (h) CT scan of the chest 05/03/2018 shows no disease involving the lungs liver or lymph nodes, with stable subcutaneous masses as previously noted  (21) Chronic pain secondary to known metastatic disease  (a) patient signed pain contract 10/19/2016  (b) as of 11/16/2016 receives her pain medications from Dr [name]  (c)  Dr [name] no longer able to prescribe as of February 2019  (d) all pain medications now through [facility] and Wellness clinic (as of April 2019)   PLAN:  [name] is looking significantly better than usual and is thinking better and more clearly than usual.  She tells me she got detoxed from  alprazolam and "I do not want any more of that stuff".  Clearly the hope and wellness clinic is working well for her.  She did not want to get treated today because she has a birthday coming up and her  family is coming in and she wants to feel good there is she will be treated again next week.  I will drop the dose of trastuzumab so that the following treatments do not need to be rescheduled.  We reviewed her CT scan extensively and she understands it is very favorable that she does not have obvious visceral metastatic disease.  The skin lesions are now not ulcerated or open, they are flatter, and they are nontender.  The right arm swelling is also improved.  At this point I am delighted at how well [name] is doing.   We are continuing her monthly treatments and she will need an echocardiogram sometime in September.  She knows to call for any problems that may develop before the next visit here.   Keithen Capo, [name], MD  05/16/18 1:07 PM Medical Oncology and Hematology [facility] [address] [place], [place] [address] Tel. [phone]    Fax. [phone]    I, Soijett Blue am acting as scribe for Dr. Sarajane Jews C. Talaysha Freeberg.  I, Lurline Del MD, have reviewed the above documentation for accuracy and completeness, and I agree with the above.

## 2018-05-16 ENCOUNTER — Telehealth: Payer: Self-pay | Admitting: Oncology

## 2018-05-16 ENCOUNTER — Inpatient Hospital Stay: Payer: Commercial Managed Care - PPO

## 2018-05-16 ENCOUNTER — Inpatient Hospital Stay: Payer: Commercial Managed Care - PPO | Attending: Oncology

## 2018-05-16 ENCOUNTER — Inpatient Hospital Stay (HOSPITAL_BASED_OUTPATIENT_CLINIC_OR_DEPARTMENT_OTHER): Payer: Commercial Managed Care - PPO | Admitting: Oncology

## 2018-05-16 ENCOUNTER — Other Ambulatory Visit: Payer: Self-pay | Admitting: Licensed Clinical Social Worker

## 2018-05-16 ENCOUNTER — Encounter: Payer: Self-pay | Admitting: General Practice

## 2018-05-16 VITALS — BP 120/83 | HR 70 | Temp 98.6°F | Resp 18 | Ht 65.0 in | Wt 190.1 lb

## 2018-05-16 DIAGNOSIS — Z79899 Other long term (current) drug therapy: Secondary | ICD-10-CM | POA: Insufficient documentation

## 2018-05-16 DIAGNOSIS — F17200 Nicotine dependence, unspecified, uncomplicated: Secondary | ICD-10-CM | POA: Insufficient documentation

## 2018-05-16 DIAGNOSIS — C4911 Malignant neoplasm of connective and soft tissue of right upper limb, including shoulder: Secondary | ICD-10-CM

## 2018-05-16 DIAGNOSIS — Z5111 Encounter for antineoplastic chemotherapy: Secondary | ICD-10-CM | POA: Insufficient documentation

## 2018-05-16 DIAGNOSIS — Z9013 Acquired absence of bilateral breasts and nipples: Secondary | ICD-10-CM

## 2018-05-16 DIAGNOSIS — Z170 Estrogen receptor positive status [ER+]: Secondary | ICD-10-CM

## 2018-05-16 DIAGNOSIS — C792 Secondary malignant neoplasm of skin: Secondary | ICD-10-CM

## 2018-05-16 DIAGNOSIS — C778 Secondary and unspecified malignant neoplasm of lymph nodes of multiple regions: Secondary | ICD-10-CM | POA: Insufficient documentation

## 2018-05-16 DIAGNOSIS — C50211 Malignant neoplasm of upper-inner quadrant of right female breast: Secondary | ICD-10-CM | POA: Diagnosis not present

## 2018-05-16 DIAGNOSIS — Z5112 Encounter for antineoplastic immunotherapy: Secondary | ICD-10-CM | POA: Diagnosis not present

## 2018-05-16 DIAGNOSIS — Z86718 Personal history of other venous thrombosis and embolism: Secondary | ICD-10-CM

## 2018-05-16 DIAGNOSIS — Z95828 Presence of other vascular implants and grafts: Secondary | ICD-10-CM

## 2018-05-16 LAB — COMPREHENSIVE METABOLIC PANEL
ALT: 12 U/L (ref 0–44)
AST: 19 U/L (ref 15–41)
Albumin: 3.7 g/dL (ref 3.5–5.0)
Alkaline Phosphatase: 127 U/L — ABNORMAL HIGH (ref 38–126)
Anion gap: 8 (ref 5–15)
BUN: 13 mg/dL (ref 8–23)
CO2: 32 mmol/L (ref 22–32)
Calcium: 9.5 mg/dL (ref 8.9–10.3)
Chloride: 100 mmol/L (ref 98–111)
Creatinine, Ser: 0.97 mg/dL (ref 0.44–1.00)
GFR calc Af Amer: >60 mL/min (ref >60)
GFR calc non Af Amer: >60 mL/min (ref >60)
Glucose, Bld: 91 mg/dL (ref 70–99)
Potassium: 4.1 mmol/L (ref 3.5–5.1)
Sodium: 140 mmol/L (ref 135–145)
Total Bilirubin: 0.3 mg/dL (ref 0.3–1.2)
Total Protein: 7.7 g/dL (ref 6.5–8.1)

## 2018-05-16 LAB — CBC WITH DIFFERENTIAL/PLATELET
Basophils Absolute: 0 10*3/uL (ref 0.0–0.1)
Basophils Relative: 0 %
Eosinophils Absolute: 0.2 10*3/uL (ref 0.0–0.5)
Eosinophils Relative: 3 %
HCT: 36.1 % (ref 34.8–46.6)
Hemoglobin: 11.1 g/dL — ABNORMAL LOW (ref 11.6–15.9)
Lymphocytes Relative: 20 %
Lymphs Abs: 1.4 10*3/uL (ref 0.9–3.3)
MCH: 26.4 pg (ref 25.1–34.0)
MCHC: 30.7 g/dL — ABNORMAL LOW (ref 31.5–36.0)
MCV: 86 fL (ref 79.5–101.0)
Monocytes Absolute: 0.8 10*3/uL (ref 0.1–0.9)
Monocytes Relative: 11 %
Neutro Abs: 4.9 10*3/uL (ref 1.5–6.5)
Neutrophils Relative %: 66 %
Platelets: 199 10*3/uL (ref 145–400)
RBC: 4.2 MIL/uL (ref 3.70–5.45)
RDW: 16 % — ABNORMAL HIGH (ref 11.2–14.5)
WBC: 7.3 10*3/uL (ref 3.9–10.3)

## 2018-05-16 MED ORDER — VITAMIN D 1000 UNITS PO TABS: 1000.0000 [IU] | ORAL_TABLET | Freq: Every day | ORAL | 4 refills | Status: DC

## 2018-05-16 MED ORDER — GABAPENTIN 300 MG PO CAPS: 300.0000 mg | ORAL_CAPSULE | Freq: Every day | ORAL | 3 refills | Status: DC

## 2018-05-16 MED ORDER — SODIUM CHLORIDE 0.9 % IJ SOLN
10.0000 mL | INTRAMUSCULAR | Status: DC | PRN
  Administered 2018-05-16: 10 mL via INTRAVENOUS
  Filled 2018-05-16: qty 10

## 2018-05-16 NOTE — Patient Outreach (Signed)
[facility]) Care Management  05/16/2018  [name] 1955-12-07 [id number]   [facility] CSW received incoming call from patient on 05/16/18. She reports that she is aware that [place] were trying to get in contact with her. Patient reports that [facility] CSW was trying to contact her in order to provide mental health and in home assistance resources. Patient reports not needing mental health resources and already having an aide through Medicaid [facility].) Patient reports wanting assistance with finding a PCP. Patient reports that she is currently at [facility] and just met with the social worker there today. According to chart, "CSW reviewed chart, found that patient has not connected w [facility] for outpatient case management despite outreach calls.  Provided [facility] brochure and contact information, encouraged patient to use their help to establish w PCP.  Also gave info from chart re available PCPs - pt states she will try [facility].  Currently is using ED for concerns that might be better addressed by PCP.  Encouraged patient to access care at PCP for greater consistency of support." Patient was reminded that [facility] RNCM was recently involved and has already provided resource assistance for finding a PCP. However, patient has still not been able to find one yet. A list was already provided to her as well. [facility] CSW provided [facility] RNCM's contact information to patient in order for her to make a call to nurse. Patient appreciative of information provided. [facility] CSW will sign off at this time as patient denies any social work needs. [facility] CSW will route encounter to [place] as well.  [name], BSW, MSW, [name] Phone: [phone] Fax: [phone]

## 2018-05-16 NOTE — Progress Notes (Signed)
[place] CSW Progress Note  Met w patient in office - patient wondering if she is eligible for any financial assistance.  Reviewed history of requests - she was approved for $300 annual grant from [facility], check was mailed in Jan 2019.  Agency will check to see if check was cashed and notify CSW.  Has already received [employer].  CSW offered referrals to local agencies that provide financial assistance, pt declined and asked that "that be given to someone more in need than me."   CSW reviewed chart, found that patient has not connected w [facility] for outpatient case management despite outreach calls.  Provided [facility] brochure and contact information, encouraged patient to use their help to establish w PCP.  Also gave info from chart re available PCPs - pt states she will try [facility].  Currently is using ED for concerns that might be better addressed by PCP.  Encouraged patient to access care at PCP for greater consistency of support.   Patient also concerned w access to pain management - is currently using [facility] in [place] but concerned about cost.    Patient states that she is receiving in home aide services through [facility] funded service - cannot remember name of agency.  This may be Medicaid PCS services, an RN comes to the house every 3 months to assess need.  CSW will refer patient to [facility]) for possible outpatient case management if she does not connect w [facility].  Patient aware that she will need to answer calls from unknown numbers as part of the referral process - has missed outreach calls due to this issue in the past.  Edwyna Shell, [place] Worker Phone:  [phone]

## 2018-05-16 NOTE — Telephone Encounter (Signed)
LMVM for patient regarding appointments scheduled per 7/10 los

## 2018-05-17 ENCOUNTER — Other Ambulatory Visit: Payer: Self-pay | Admitting: *Deleted

## 2018-05-17 ENCOUNTER — Telehealth: Payer: Self-pay | Admitting: General Practice

## 2018-05-17 NOTE — Progress Notes (Signed)
FMLA paperwork completed and faxed to   Attn: Teressa Senter   Fax number: [phone]

## 2018-05-17 NOTE — Telephone Encounter (Signed)
[place] CSW Progress Note  Call to patient to let her know that [place] records show that check for $300 mailed to her in Jan 2019 has been cashed.  She can apply again in Jan 2020.  Edwyna Shell, LCSW Clinical Social Worker Phone:  [phone]

## 2018-05-21 NOTE — Progress Notes (Signed)
FMLA for son, [employer], was successfully faxed to Teressa Senter at [phone]. Mailed copy to patient address on file.

## 2018-05-23 ENCOUNTER — Other Ambulatory Visit: Payer: Self-pay | Admitting: *Deleted

## 2018-05-23 DIAGNOSIS — C50211 Malignant neoplasm of upper-inner quadrant of right female breast: Secondary | ICD-10-CM

## 2018-05-23 DIAGNOSIS — Z170 Estrogen receptor positive status [ER+]: Secondary | ICD-10-CM

## 2018-05-24 ENCOUNTER — Other Ambulatory Visit: Payer: Self-pay | Admitting: Oncology

## 2018-05-24 ENCOUNTER — Inpatient Hospital Stay: Payer: Commercial Managed Care - PPO

## 2018-05-24 VITALS — BP 147/88 | HR 65 | Temp 99.0°F | Resp 18

## 2018-05-24 DIAGNOSIS — C778 Secondary and unspecified malignant neoplasm of lymph nodes of multiple regions: Secondary | ICD-10-CM | POA: Diagnosis not present

## 2018-05-24 DIAGNOSIS — C50211 Malignant neoplasm of upper-inner quadrant of right female breast: Secondary | ICD-10-CM | POA: Diagnosis not present

## 2018-05-24 DIAGNOSIS — Z170 Estrogen receptor positive status [ER+]: Secondary | ICD-10-CM

## 2018-05-24 DIAGNOSIS — C4911 Malignant neoplasm of connective and soft tissue of right upper limb, including shoulder: Secondary | ICD-10-CM | POA: Diagnosis not present

## 2018-05-24 DIAGNOSIS — C792 Secondary malignant neoplasm of skin: Secondary | ICD-10-CM | POA: Diagnosis not present

## 2018-05-24 DIAGNOSIS — Z5112 Encounter for antineoplastic immunotherapy: Secondary | ICD-10-CM | POA: Diagnosis not present

## 2018-05-24 DIAGNOSIS — Z5111 Encounter for antineoplastic chemotherapy: Secondary | ICD-10-CM | POA: Diagnosis not present

## 2018-05-24 LAB — CMP (CANCER CENTER ONLY)
ALT: 11 U/L (ref 0–44)
AST: 17 U/L (ref 15–41)
Albumin: 4 g/dL (ref 3.5–5.0)
Alkaline Phosphatase: 124 U/L (ref 38–126)
Anion gap: 9 (ref 5–15)
BUN: 17 mg/dL (ref 8–23)
CO2: 31 mmol/L (ref 22–32)
Calcium: 9.9 mg/dL (ref 8.9–10.3)
Chloride: 99 mmol/L (ref 98–111)
Creatinine: 1.04 mg/dL — ABNORMAL HIGH (ref 0.44–1.00)
GFR, Est AFR Am: >60 mL/min (ref >60)
GFR, Estimated: 56 mL/min — ABNORMAL LOW (ref >60)
Glucose, Bld: 122 mg/dL — ABNORMAL HIGH (ref 70–99)
Potassium: 3.6 mmol/L (ref 3.5–5.1)
Sodium: 139 mmol/L (ref 135–145)
Total Bilirubin: 0.4 mg/dL (ref 0.3–1.2)
Total Protein: 8 g/dL (ref 6.5–8.1)

## 2018-05-24 LAB — CBC WITH DIFFERENTIAL (CANCER CENTER ONLY)
Basophils Absolute: 0 10*3/uL (ref 0.0–0.1)
Basophils Relative: 1 %
Eosinophils Absolute: 0.2 10*3/uL (ref 0.0–0.5)
Eosinophils Relative: 4 %
HCT: 38.5 % (ref 34.8–46.6)
Hemoglobin: 12.2 g/dL (ref 11.6–15.9)
Lymphocytes Relative: 25 %
Lymphs Abs: 1.6 10*3/uL (ref 0.9–3.3)
MCH: 26.7 pg (ref 25.1–34.0)
MCHC: 31.7 g/dL (ref 31.5–36.0)
MCV: 84.2 fL (ref 79.5–101.0)
Monocytes Absolute: 0.7 10*3/uL (ref 0.1–0.9)
Monocytes Relative: 10 %
Neutro Abs: 4 10*3/uL (ref 1.5–6.5)
Neutrophils Relative %: 60 %
Platelet Count: 277 10*3/uL (ref 145–400)
RBC: 4.57 MIL/uL (ref 3.70–5.45)
RDW: 15.6 % — ABNORMAL HIGH (ref 11.2–14.5)
WBC Count: 6.5 10*3/uL (ref 3.9–10.3)

## 2018-05-24 MED ORDER — PACLITAXEL PROTEIN-BOUND CHEMO INJECTION 100 MG
100.0000 mg/m2 | Freq: Once | INTRAVENOUS | Status: AC
  Administered 2018-05-24: 200 mg via INTRAVENOUS
  Filled 2018-05-24: qty 40

## 2018-05-24 MED ORDER — DIPHENHYDRAMINE HCL 25 MG PO CAPS
25.0000 mg | ORAL_CAPSULE | Freq: Once | ORAL | Status: AC
  Administered 2018-05-24: 25 mg via ORAL

## 2018-05-24 MED ORDER — ACETAMINOPHEN 325 MG PO TABS
ORAL_TABLET | ORAL | Status: AC
  Filled 2018-05-24: qty 2

## 2018-05-24 MED ORDER — SODIUM CHLORIDE 0.9 % IV SOLN
Freq: Once | INTRAVENOUS | Status: AC
  Administered 2018-05-24: 09:00:00 via INTRAVENOUS

## 2018-05-24 MED ORDER — TRASTUZUMAB CHEMO 150 MG IV SOLR
4.0000 mg/kg | Freq: Once | INTRAVENOUS | Status: AC
  Administered 2018-05-24: 336 mg via INTRAVENOUS
  Filled 2018-05-24: qty 16

## 2018-05-24 MED ORDER — PROCHLORPERAZINE MALEATE 10 MG PO TABS
10.0000 mg | ORAL_TABLET | Freq: Once | ORAL | Status: AC
  Administered 2018-05-24: 10 mg via ORAL

## 2018-05-24 MED ORDER — GABAPENTIN 100 MG PO CAPS: 100.0000 mg | ORAL_CAPSULE | Freq: Two times a day (BID) | ORAL | 2 refills | Status: DC | PRN

## 2018-05-24 MED ORDER — ACETAMINOPHEN 325 MG PO TABS
650.0000 mg | ORAL_TABLET | Freq: Once | ORAL | Status: AC
  Administered 2018-05-24: 650 mg via ORAL

## 2018-05-24 MED ORDER — PROCHLORPERAZINE MALEATE 10 MG PO TABS
ORAL_TABLET | ORAL | Status: AC
  Filled 2018-05-24: qty 1

## 2018-05-24 MED ORDER — DIPHENHYDRAMINE HCL 25 MG PO CAPS
ORAL_CAPSULE | ORAL | Status: AC
Start: 2018-05-24 — End: ?
  Filled 2018-05-24: qty 1

## 2018-05-24 MED ORDER — HEPARIN SOD (PORK) LOCK FLUSH 100 UNIT/ML IV SOLN
500.0000 [IU] | Freq: Once | INTRAVENOUS | Status: AC | PRN
  Administered 2018-05-24: 500 [IU]
  Filled 2018-05-24: qty 5

## 2018-05-24 MED ORDER — SODIUM CHLORIDE 0.9% FLUSH
10.0000 mL | INTRAVENOUS | Status: DC | PRN
  Administered 2018-05-24: 10 mL
  Filled 2018-05-24: qty 10

## 2018-05-24 NOTE — Patient Instructions (Signed)
[facility] Discharge Instructions for Patients Receiving Chemotherapy  Today you received the following chemotherapy agents: Herceptin and Abraxane.  To help prevent nausea and vomiting after your treatment, we encourage you to take your nausea medication as directed.   If you develop nausea and vomiting that is not controlled by your nausea medication, call the clinic.   BELOW ARE SYMPTOMS THAT SHOULD BE REPORTED IMMEDIATELY:  *FEVER GREATER THAN 100.5 F  *CHILLS WITH OR WITHOUT FEVER  NAUSEA AND VOMITING THAT IS NOT CONTROLLED WITH YOUR NAUSEA MEDICATION  *UNUSUAL SHORTNESS OF BREATH  *UNUSUAL BRUISING OR BLEEDING  TENDERNESS IN MOUTH AND THROAT WITH OR WITHOUT PRESENCE OF ULCERS  *URINARY PROBLEMS  *BOWEL PROBLEMS  UNUSUAL RASH Items with * indicate a potential emergency and should be followed up as soon as possible.  Feel free to call the clinic should you have any questions or concerns. The clinic phone number is [phone].  Please show the CHEMO ALERT CARD at check-in to the Emergency Department and triage nurse.   

## 2018-06-12 ENCOUNTER — Ambulatory Visit (INDEPENDENT_AMBULATORY_CARE_PROVIDER_SITE_OTHER): Payer: Commercial Managed Care - PPO | Admitting: Physician Assistant

## 2018-06-12 ENCOUNTER — Other Ambulatory Visit: Payer: Self-pay

## 2018-06-12 ENCOUNTER — Encounter (INDEPENDENT_AMBULATORY_CARE_PROVIDER_SITE_OTHER): Payer: Self-pay | Admitting: Physician Assistant

## 2018-06-12 VITALS — BP 130/81 | HR 83 | Temp 98.9°F | Ht 65.0 in | Wt 189.0 lb

## 2018-06-12 DIAGNOSIS — I10 Essential (primary) hypertension: Secondary | ICD-10-CM

## 2018-06-12 DIAGNOSIS — Z131 Encounter for screening for diabetes mellitus: Secondary | ICD-10-CM

## 2018-06-12 DIAGNOSIS — G629 Polyneuropathy, unspecified: Secondary | ICD-10-CM

## 2018-06-12 DIAGNOSIS — F418 Other specified anxiety disorders: Secondary | ICD-10-CM | POA: Diagnosis not present

## 2018-06-12 DIAGNOSIS — Z1211 Encounter for screening for malignant neoplasm of colon: Secondary | ICD-10-CM

## 2018-06-12 DIAGNOSIS — Z1159 Encounter for screening for other viral diseases: Secondary | ICD-10-CM

## 2018-06-12 LAB — POCT GLYCOSYLATED HEMOGLOBIN (HGB A1C): Hemoglobin A1C: 5.6 % (ref 4.0–5.6)

## 2018-06-12 MED ORDER — GABAPENTIN 100 MG PO CAPS: 100.0000 mg | ORAL_CAPSULE | Freq: Two times a day (BID) | ORAL | 5 refills | Status: DC | PRN

## 2018-06-12 MED ORDER — TRIAMTERENE-HCTZ 37.5-25 MG PO CAPS: 1.0000 | ORAL_CAPSULE | Freq: Every morning | ORAL | 3 refills | Status: DC

## 2018-06-12 MED ORDER — ALPRAZOLAM 1 MG PO TABS: 1.0000 mg | ORAL_TABLET | Freq: Two times a day (BID) | ORAL | 0 refills | Status: DC

## 2018-06-12 NOTE — Patient Instructions (Signed)

## 2018-06-12 NOTE — Progress Notes (Signed)
Subjective:  Patient ID: [name], female    DOB: 1956/10/20  Age: 62 y.o. MRN: [id number]  CC: medication refill  HPI [name] is a 62 y.o. female with a medical history of breast cancer s/p double mastectomy, angiosarcoma of RUE, DVT, HTN, and rheumatic fever presents as a new patient requesting medication refills and management of HTN. Taking Dyazide 37.5 - 25 mg daily as directed. BP 130/81 mmHg today. Pt currently going to oncology for chemotherapy for primary angiosarcoma of RUE and for malignant neoplasm of upper-inner quadrant of right breast. Pt says she had some spread of cancer into the lymphatic system. PHQ9 17 and GAD7 20 in clinic today. Does not have any symptoms or complaints at the moment.      Outpatient Medications Prior to Visit  Medication Sig Dispense Refill  . ALPRAZolam (XANAX) 1 MG tablet Take 1 mg by mouth 2 (two) times daily.  0  . Ascorbic Acid (VITAMIN C) 100 MG tablet Take 100 mg by mouth daily.      . cholecalciferol (VITAMIN D) 1000 units tablet Take 1 tablet (1,000 Units total) by mouth daily. 90 tablet 4  . gabapentin (NEURONTIN) 100 MG capsule Take 1 capsule (100 mg total) by mouth 2 (two) times daily as needed. 60 capsule 2  . methadone (DOLOPHINE) 10 MG tablet Take 10 mg by mouth 5 (five) times daily.  0  . Multiple Vitamin (MULTIVITAMIN) capsule Take 1 capsule by mouth daily.      Marland Kitchen oxyCODONE (ROXICODONE) 15 MG immediate release tablet Take 15 mg by mouth 3 (three) times daily.  0  . Oxycodone HCl 10 MG TABS Take 10 mg by mouth 3 (three) times daily.  0  . potassium chloride (MICRO-K) 10 MEQ CR capsule Take 2 capsules (20 mEq total) by mouth daily. 60 capsule 3  . prochlorperazine (COMPAZINE) 10 MG tablet Take 1 tablet (10 mg total) by mouth every 6 (six) hours as needed for nausea or vomiting. 20 tablet 0  . triamterene-hydrochlorothiazide (DYAZIDE) 37.5-25 MG capsule Take 1 each (1 capsule total) by mouth every morning. 30 capsule 3  .  predniSONE (DELTASONE) 50 MG tablet Take 1 tablet (50mg ) 13 hrs prior to CT Scan, 7hrs, and 1hr prior to CT Scan.  (Dye allergy protocol pre medication) 3 tablet 0   Facility-Administered Medications Prior to Visit  Medication Dose Route Frequency Provider Last Rate Last Dose  . sodium chloride 0.9 % injection 10 mL  10 mL Intravenous PRN [name], [name], MD   10 mL at 03/04/14 1421  . sodium chloride flush (NS) 0.9 % injection 10 mL  10 mL Intracatheter PRN [name], [name], MD   10 mL at 03/21/18 1616     ROS Review of Systems  Constitutional: Negative for chills, fever and malaise/fatigue.  Eyes: Negative for blurred vision.  Respiratory: Negative for shortness of breath.   Cardiovascular: Negative for chest pain and palpitations.  Gastrointestinal: Negative for abdominal pain and nausea.  Genitourinary: Negative for dysuria and hematuria.  Musculoskeletal: Negative for joint pain and myalgias.  Skin: Negative for rash.  Neurological: Negative for tingling and headaches.  Psychiatric/Behavioral: Negative for depression. The patient is not nervous/anxious.     Objective:  BP 130/81 (BP Location: Left Arm, Patient Position: Sitting, Cuff Size: Large)   Pulse 83   Temp 98.9 F (37.2 C) (Oral)   Ht 5\' 5"  (1.651 m)   Wt 189 lb (85.7 kg)   SpO2 96%  BMI 31.45 kg/m   BP/Weight 06/12/2018 05/24/2018 9/40/7680  Systolic BP 881 103 159  Diastolic BP 81 88 83  Wt. (Lbs) 189 - 190.1  BMI 31.45 - 31.63      Physical Exam  Constitutional: She is oriented to person, place, and time.  Well developed, well nourished, NAD, polite, wearing RUE compression sleeve  HENT:  Head: Normocephalic and atraumatic.  Eyes: No scleral icterus.  Neck: Normal range of motion. Neck supple. No thyromegaly present.  Cardiovascular: Normal rate, regular rhythm and normal heart sounds.  Pulmonary/Chest: Effort normal and breath sounds normal.  Musculoskeletal: She exhibits no edema.   Neurological: She is alert and oriented to person, place, and time.  Skin: Skin is warm and dry. No rash noted. No erythema. No pallor.  Psychiatric: She has a normal mood and affect. Her behavior is normal. Thought content normal.  Vitals reviewed.    Assessment & Plan:    1. Hypertension, unspecified type - Refilled triamterene-hydrochlorothiazide (DYAZIDE) 37.5-25 MG capsule; Take 1 each (1 capsule total) by mouth every morning.  Dispense: 30 capsule; Refill: 3 - Lipid panel; Future - TSH - Comprehensive metabolic panel  2. Screening for diabetes mellitus - HgB A1c 5.6%  3. Need for hepatitis C screening test - Hepatitis c antibody (reflex)  4. Screening for colon cancer - Pt declines and does not want to "ever have testing done". Pt changed her mind. Test reordered. - Fecal Immunochemical Test  5. Depression with anxiety - Ambulatory referral to Psychiatry  *Pt states she wants a PCP but initially declined screening tests and routine blood work. I have explained I am getting to know her am I am just ordering routine testing to help find out if there are any underlying conditions. I continued saying, any good provider should know what conditions their patient has and does not have. She said she already knows what she has and doesn't have. Pt still very reluctant to the explanation given. She eventually decided to have routine screening and testing done after much time thinking about the requests I made. This did not bode well for the provider/patient relationship of which advise and testing is part of the relationship.   *Pt advised to request refills of oxycodone from her original prescriber as we do not prescribe opiods for chronic pain.  Meds ordered this encounter  Medications  . triamterene-hydrochlorothiazide (DYAZIDE) 37.5-25 MG capsule    Sig: Take 1 each (1 capsule total) by mouth every morning.    Dispense:  30 capsule    Refill:  3    Order Specific Question:    Supervising Provider    Answer:   Charlott Rakes [[id number]]  . ALPRAZolam (XANAX) 1 MG tablet    Sig: Take 1 tablet (1 mg total) by mouth 2 (two) times daily.    Dispense:  60 tablet    Refill:  0    Order Specific Question:   Supervising Provider    Answer:   Charlott Rakes [[id number]]    Follow-up: Return in about 3 months (around 09/12/2018) for HTN and vaccines.   Clent Demark PA

## 2018-06-13 ENCOUNTER — Inpatient Hospital Stay: Payer: Commercial Managed Care - PPO | Attending: Oncology

## 2018-06-13 ENCOUNTER — Other Ambulatory Visit: Payer: Self-pay

## 2018-06-13 ENCOUNTER — Inpatient Hospital Stay: Payer: Commercial Managed Care - PPO

## 2018-06-13 VITALS — BP 118/65 | HR 67 | Temp 99.0°F | Resp 19

## 2018-06-13 DIAGNOSIS — C778 Secondary and unspecified malignant neoplasm of lymph nodes of multiple regions: Secondary | ICD-10-CM | POA: Diagnosis not present

## 2018-06-13 DIAGNOSIS — C4911 Malignant neoplasm of connective and soft tissue of right upper limb, including shoulder: Secondary | ICD-10-CM

## 2018-06-13 DIAGNOSIS — Z95828 Presence of other vascular implants and grafts: Secondary | ICD-10-CM

## 2018-06-13 DIAGNOSIS — C50211 Malignant neoplasm of upper-inner quadrant of right female breast: Secondary | ICD-10-CM

## 2018-06-13 DIAGNOSIS — C792 Secondary malignant neoplasm of skin: Secondary | ICD-10-CM | POA: Diagnosis not present

## 2018-06-13 DIAGNOSIS — Z5111 Encounter for antineoplastic chemotherapy: Secondary | ICD-10-CM | POA: Diagnosis not present

## 2018-06-13 DIAGNOSIS — Z170 Estrogen receptor positive status [ER+]: Secondary | ICD-10-CM

## 2018-06-13 DIAGNOSIS — Z5112 Encounter for antineoplastic immunotherapy: Secondary | ICD-10-CM | POA: Insufficient documentation

## 2018-06-13 LAB — COMPREHENSIVE METABOLIC PANEL
ALT: 12 U/L (ref 0–44)
AST: 22 U/L (ref 15–41)
Albumin: 3.7 g/dL (ref 3.5–5.0)
Alkaline Phosphatase: 113 U/L (ref 38–126)
Anion gap: 9 (ref 5–15)
BUN: 13 mg/dL (ref 8–23)
CO2: 31 mmol/L (ref 22–32)
Calcium: 9 mg/dL (ref 8.9–10.3)
Chloride: 100 mmol/L (ref 98–111)
Creatinine, Ser: 0.9 mg/dL (ref 0.44–1.00)
GFR calc Af Amer: >60 mL/min (ref >60)
GFR calc non Af Amer: >60 mL/min (ref >60)
Glucose, Bld: 98 mg/dL (ref 70–99)
Potassium: 4.1 mmol/L (ref 3.5–5.1)
Sodium: 140 mmol/L (ref 135–145)
Total Bilirubin: 0.3 mg/dL (ref 0.3–1.2)
Total Protein: 7.6 g/dL (ref 6.5–8.1)

## 2018-06-13 LAB — CBC WITH DIFFERENTIAL/PLATELET
Basophils Absolute: 0 10*3/uL (ref 0.0–0.1)
Basophils Relative: 0 %
Eosinophils Absolute: 0.1 10*3/uL (ref 0.0–0.5)
Eosinophils Relative: 2 %
HCT: 36.8 % (ref 34.8–46.6)
Hemoglobin: 11.4 g/dL — ABNORMAL LOW (ref 11.6–15.9)
Lymphocytes Relative: 16 %
Lymphs Abs: 1.2 10*3/uL (ref 0.9–3.3)
MCH: 26.5 pg (ref 25.1–34.0)
MCHC: 31 g/dL — ABNORMAL LOW (ref 31.5–36.0)
MCV: 85.4 fL (ref 79.5–101.0)
Monocytes Absolute: 0.8 10*3/uL (ref 0.1–0.9)
Monocytes Relative: 11 %
Neutro Abs: 5.2 10*3/uL (ref 1.5–6.5)
Neutrophils Relative %: 71 %
Platelets: 209 10*3/uL (ref 145–400)
RBC: 4.31 MIL/uL (ref 3.70–5.45)
RDW: 16.1 % — ABNORMAL HIGH (ref 11.2–14.5)
WBC: 7.3 10*3/uL (ref 3.9–10.3)

## 2018-06-13 MED ORDER — SODIUM CHLORIDE 0.9% FLUSH
10.0000 mL | INTRAVENOUS | Status: DC | PRN
  Administered 2018-06-13: 10 mL
  Filled 2018-06-13: qty 10

## 2018-06-13 MED ORDER — PROCHLORPERAZINE MALEATE 10 MG PO TABS
ORAL_TABLET | ORAL | Status: AC
  Filled 2018-06-13: qty 1

## 2018-06-13 MED ORDER — SODIUM CHLORIDE 0.9 % IV SOLN
420.0000 mg | Freq: Once | INTRAVENOUS | Status: AC
  Administered 2018-06-13: 420 mg via INTRAVENOUS
  Filled 2018-06-13: qty 14

## 2018-06-13 MED ORDER — SODIUM CHLORIDE 0.9 % IJ SOLN
10.0000 mL | INTRAMUSCULAR | Status: DC | PRN
  Administered 2018-06-13: 10 mL via INTRAVENOUS
  Filled 2018-06-13: qty 10

## 2018-06-13 MED ORDER — ACETAMINOPHEN 325 MG PO TABS
650.0000 mg | ORAL_TABLET | Freq: Once | ORAL | Status: AC
  Administered 2018-06-13: 650 mg via ORAL

## 2018-06-13 MED ORDER — DIPHENHYDRAMINE HCL 25 MG PO CAPS
25.0000 mg | ORAL_CAPSULE | Freq: Once | ORAL | Status: AC
  Administered 2018-06-13: 25 mg via ORAL

## 2018-06-13 MED ORDER — SODIUM CHLORIDE 0.9 % IV SOLN
Freq: Once | INTRAVENOUS | Status: AC
  Administered 2018-06-13: 13:00:00 via INTRAVENOUS
  Filled 2018-06-13: qty 250

## 2018-06-13 MED ORDER — EMPTY CONTAINERS FLEXIBLE MISC: 100.0000 mg/m2 | Freq: Once | Status: DC

## 2018-06-13 MED ORDER — PROCHLORPERAZINE MALEATE 10 MG PO TABS
10.0000 mg | ORAL_TABLET | Freq: Once | ORAL | Status: AC
  Administered 2018-06-13: 10 mg via ORAL

## 2018-06-13 MED ORDER — ACETAMINOPHEN 325 MG PO TABS
ORAL_TABLET | ORAL | Status: AC
  Filled 2018-06-13: qty 2

## 2018-06-13 MED ORDER — PROCHLORPERAZINE MALEATE 10 MG PO TABS: 10.0000 mg | ORAL_TABLET | Freq: Four times a day (QID) | ORAL | 0 refills | Status: DC | PRN

## 2018-06-13 MED ORDER — HEPARIN SOD (PORK) LOCK FLUSH 100 UNIT/ML IV SOLN
500.0000 [IU] | Freq: Once | INTRAVENOUS | Status: AC | PRN
  Administered 2018-06-13: 500 [IU]
  Filled 2018-06-13: qty 5

## 2018-06-13 MED ORDER — PACLITAXEL PROTEIN-BOUND CHEMO INJECTION 100 MG
100.0000 mg/m2 | Freq: Once | INTRAVENOUS | Status: AC
  Administered 2018-06-13: 200 mg via INTRAVENOUS
  Filled 2018-06-13: qty 40

## 2018-06-13 MED ORDER — DIPHENHYDRAMINE HCL 25 MG PO CAPS
ORAL_CAPSULE | ORAL | Status: AC
  Filled 2018-06-13: qty 2

## 2018-06-13 MED ORDER — TRASTUZUMAB CHEMO 150 MG IV SOLR
6.0000 mg/kg | Freq: Once | INTRAVENOUS | Status: AC
  Administered 2018-06-13: 525 mg via INTRAVENOUS
  Filled 2018-06-13: qty 25

## 2018-06-13 NOTE — Patient Instructions (Signed)
[facility] Discharge Instructions for Patients Receiving Chemotherapy  Today you received the following chemotherapy agents Herceptin, Perjeta, Abraxane.  To help prevent nausea and vomiting after your treatment, we encourage you to take your nausea medication as directed.  If you develop nausea and vomiting that is not controlled by your nausea medication, call the clinic.   BELOW ARE SYMPTOMS THAT SHOULD BE REPORTED IMMEDIATELY:  *FEVER GREATER THAN 100.5 F  *CHILLS WITH OR WITHOUT FEVER  NAUSEA AND VOMITING THAT IS NOT CONTROLLED WITH YOUR NAUSEA MEDICATION  *UNUSUAL SHORTNESS OF BREATH  *UNUSUAL BRUISING OR BLEEDING  TENDERNESS IN MOUTH AND THROAT WITH OR WITHOUT PRESENCE OF ULCERS  *URINARY PROBLEMS  *BOWEL PROBLEMS  UNUSUAL RASH Items with * indicate a potential emergency and should be followed up as soon as possible.  Feel free to call the clinic should you have any questions or concerns. The clinic phone number is [phone].  Please show the CHEMO ALERT CARD at check-in to the Emergency Department and triage nurse.   

## 2018-06-21 IMAGING — CR DG CHEST 1V
1 series · 1 of 1 positions shown · non-contrast
Comparison: 01/23/2017

CLINICAL DATA: Cough fever and leukocytosis.

EXAM:
CHEST  1 VIEW

[w chest pa]
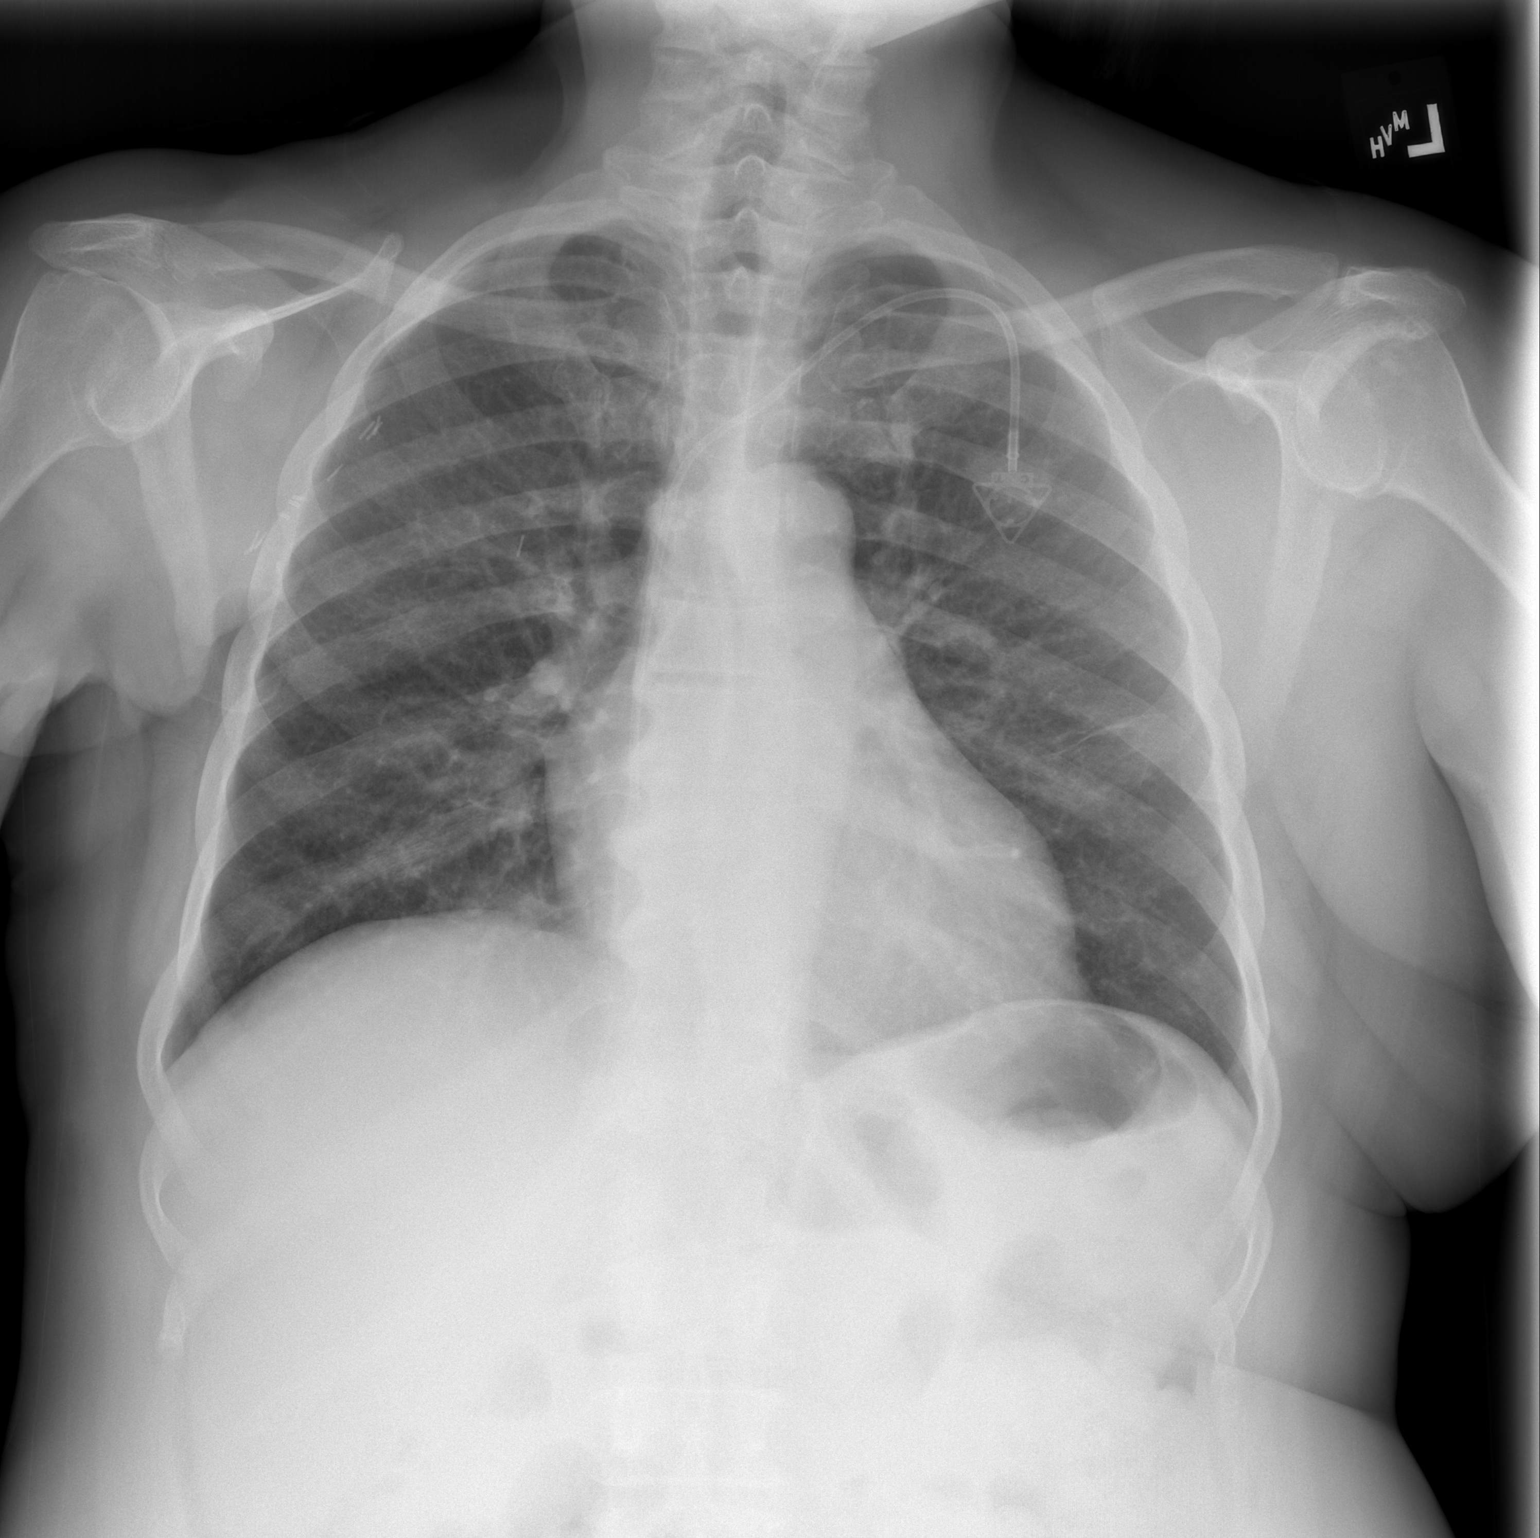

[1 of 1 positions shown; findings below may reference images not displayed]

FINDINGS: Stable position of injectable port.

Cardiomediastinal silhouette is normal. Mediastinal contours appear
intact.

There is no evidence of focal airspace consolidation, pleural
effusion or pneumothorax. Near complete resolution of previously
seen lingular airspace consolidation.

Right breast/chest wall postsurgical changes.
IMPRESSION: Near complete resolution of previously seen lingular airspace
consolidation.

## 2018-06-22 IMAGING — CT CT HEAD W/O CM
3 of 4 series · 15 of 47 positions shown, 18 images · non-contrast
Comparison: Head CT dated 04/25/2011

CLINICAL DATA: 61-year-old female with altered mental status.
History of breast cancer.

EXAM:
CT HEAD WITHOUT CONTRAST
TECHNIQUE: Contiguous axial images were obtained from the base of the skull
through the vertex without intravenous contrast.

[Series 2: head wo · axial · 0.47mm/px · z∈[+1430,+1565]mm · 9 of 33 slices shown, 12 images]
[im 3/33  brain]
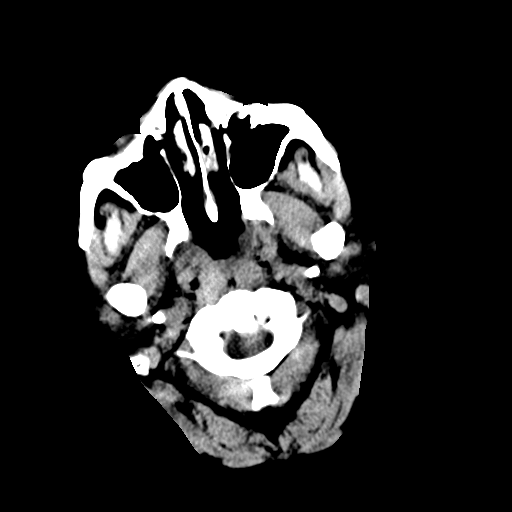
[im 3/33  bone]
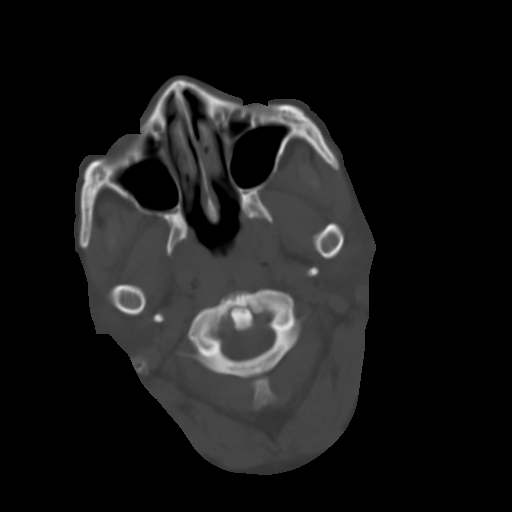
[im 6/33  brain]
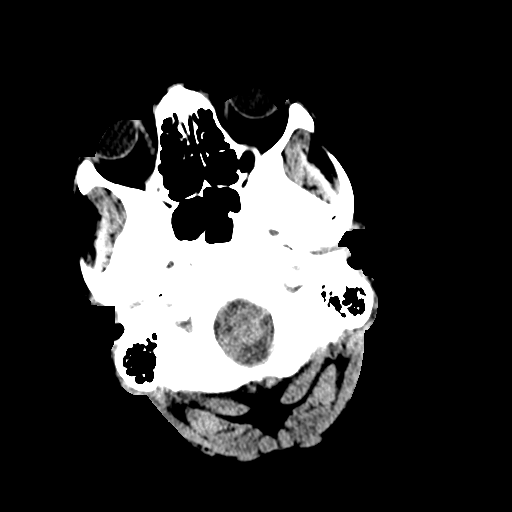
[im 10/33  brain]
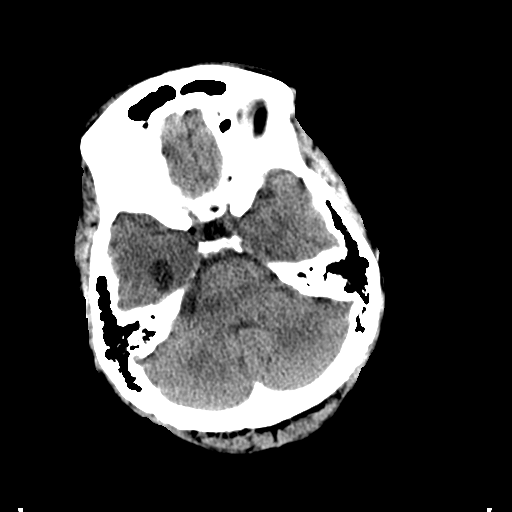
[im 13/33  brain]
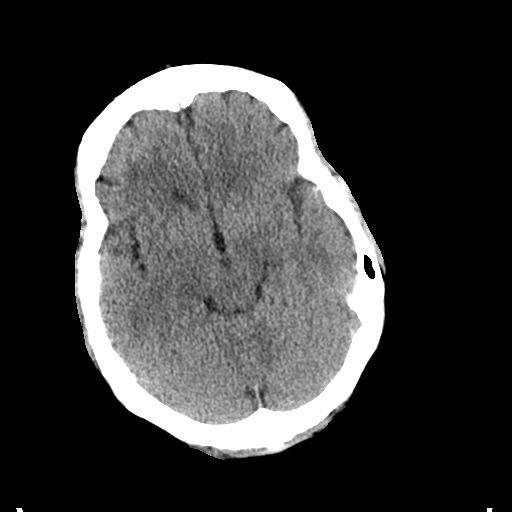
[im 17/33  brain]
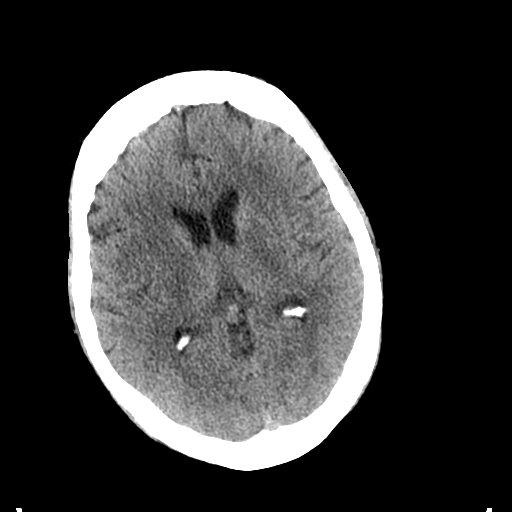
[im 17/33  bone]
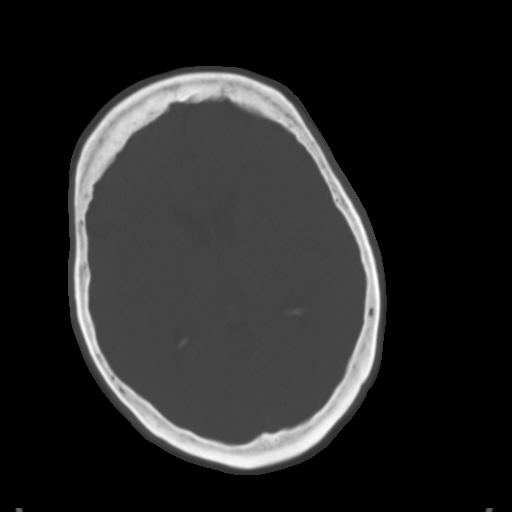
[im 21/33  brain]
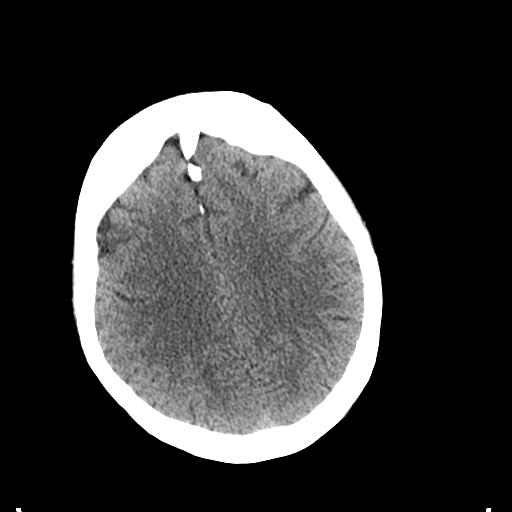
[im 23/33  brain]
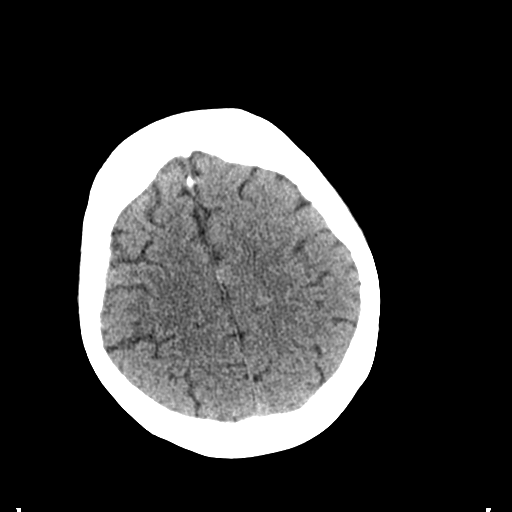
[im 27/33  brain]
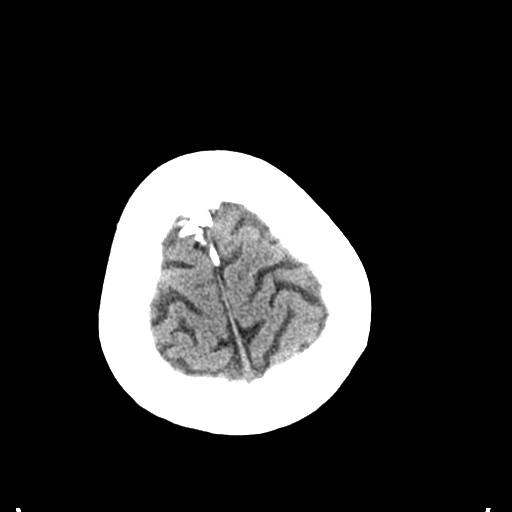
[im 30/33  brain]
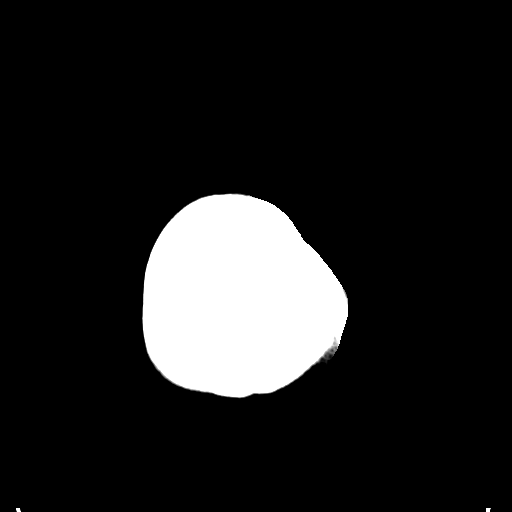
[im 30/33  bone]
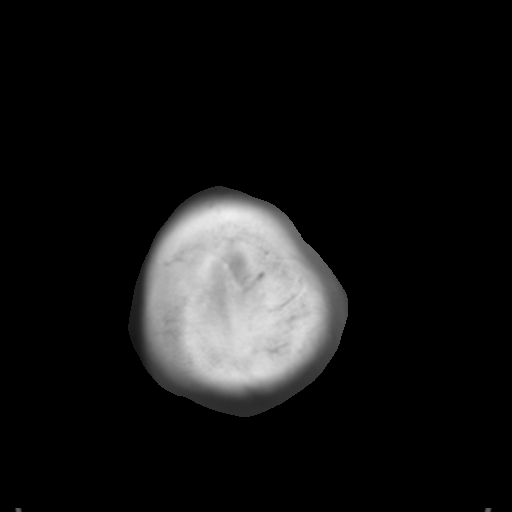

[Series 4: coronal soft tissue · coronal · 0.30mm/px · 3 of 64 slices shown]
[im 22/64  brain]
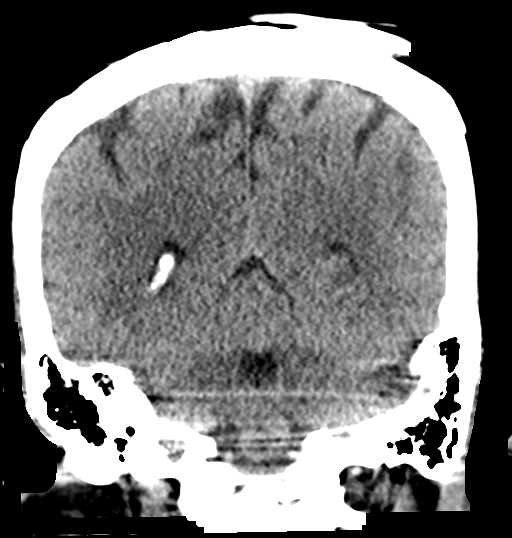
[im 29/64  brain]
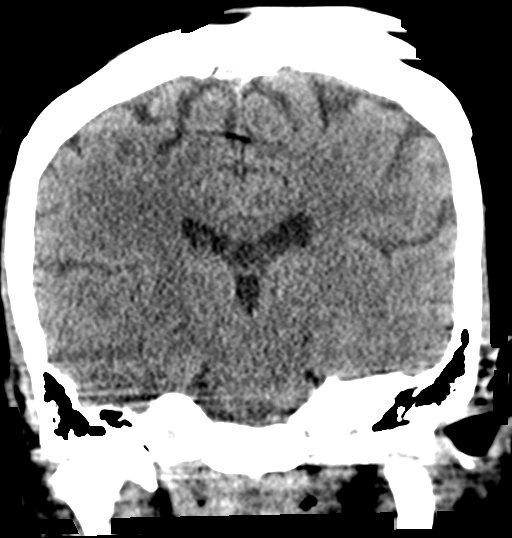
[im 36/64  brain]
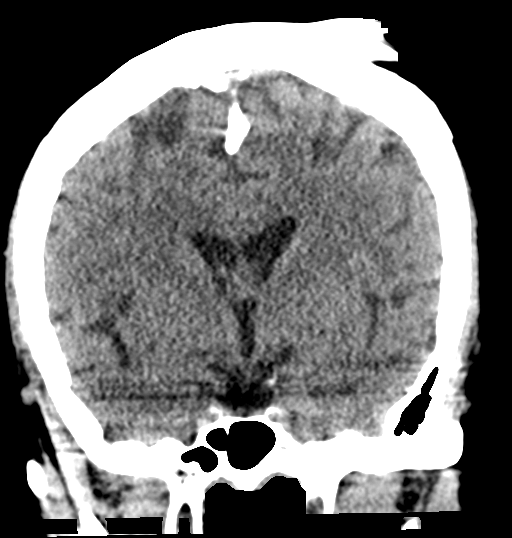

[Series 5: sagittal soft tissue · sagittal · 0.32mm/px · 3 of 51 slices shown]
[im 17/51  brain]
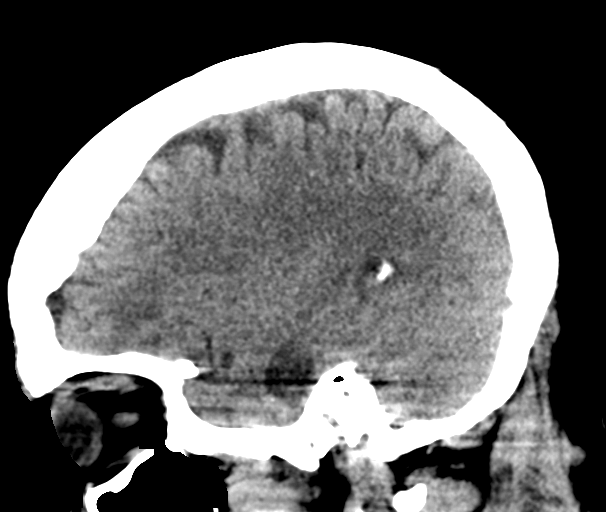
[im 26/51  brain]
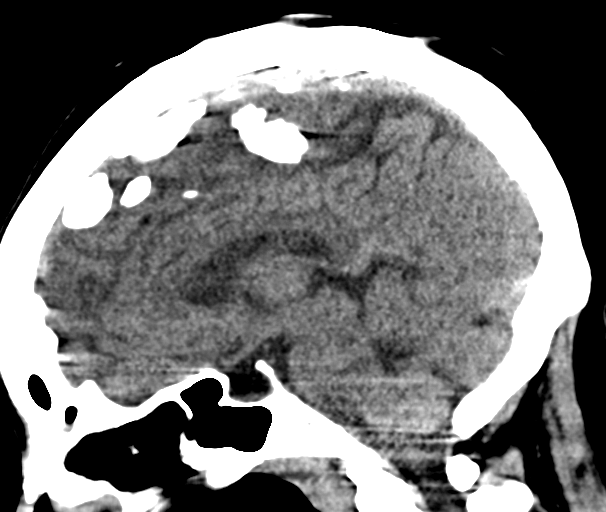
[im 34/51  brain]
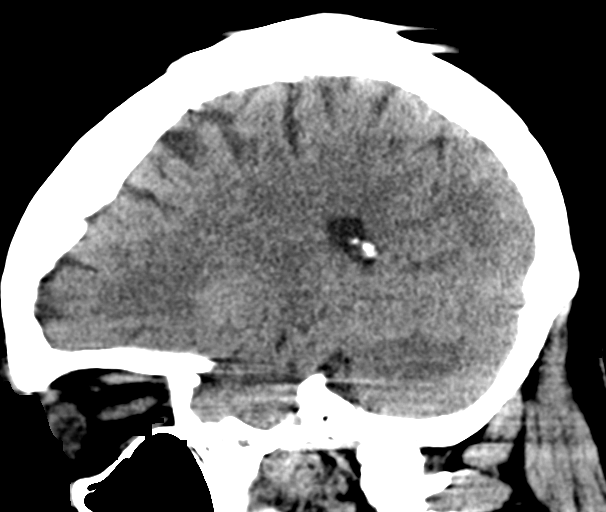

[15 of 47 positions shown; findings below may reference images not displayed]

FINDINGS: Brain: The ventricles and sulci appropriate size for patient's age.
Mild periventricular and deep white matter chronic microvascular
ischemic changes noted. There is no acute intracranial hemorrhage.
No mass effect or midline shift. There is a 1.0 x 1.5 cm CSF density
area in the inferior right temporal lobe which is new compared to
the prior CT and likely represents focal area of dilatation of the
temporal horn of the right lateral ventricle. An area of
encephalomalacia related to an old infarct or an arachnoid cyst are
less likely but not excluded. This can be further evaluated with
MRI.

Vascular: No hyperdense vessel or unexpected calcification.

Skull: Normal. Negative for fracture or focal lesion.

Sinuses/Orbits: No acute finding.

Other: None
IMPRESSION: 1. No acute intracranial pathology.
2. Mild chronic microvascular ischemic changes.
3. Small CSF density area in the inferior medial right temporal lobe
most likely represents focal dilatation of the temporal horn of the
right lateral ventricle and less likely focal encephalomalacia
related to prior infarct or a small arachnoid cyst. This can be
further evaluated with MRI if clinically indicated.

## 2018-06-22 IMAGING — DX DG CHEST 1V PORT
1 series · 1 of 1 positions shown · non-contrast
Comparison: 02/05/2018, 01/23/2017

CLINICAL DATA: Altered mental status with fever

EXAM:
PORTABLE CHEST 1 VIEW

[chest ap]
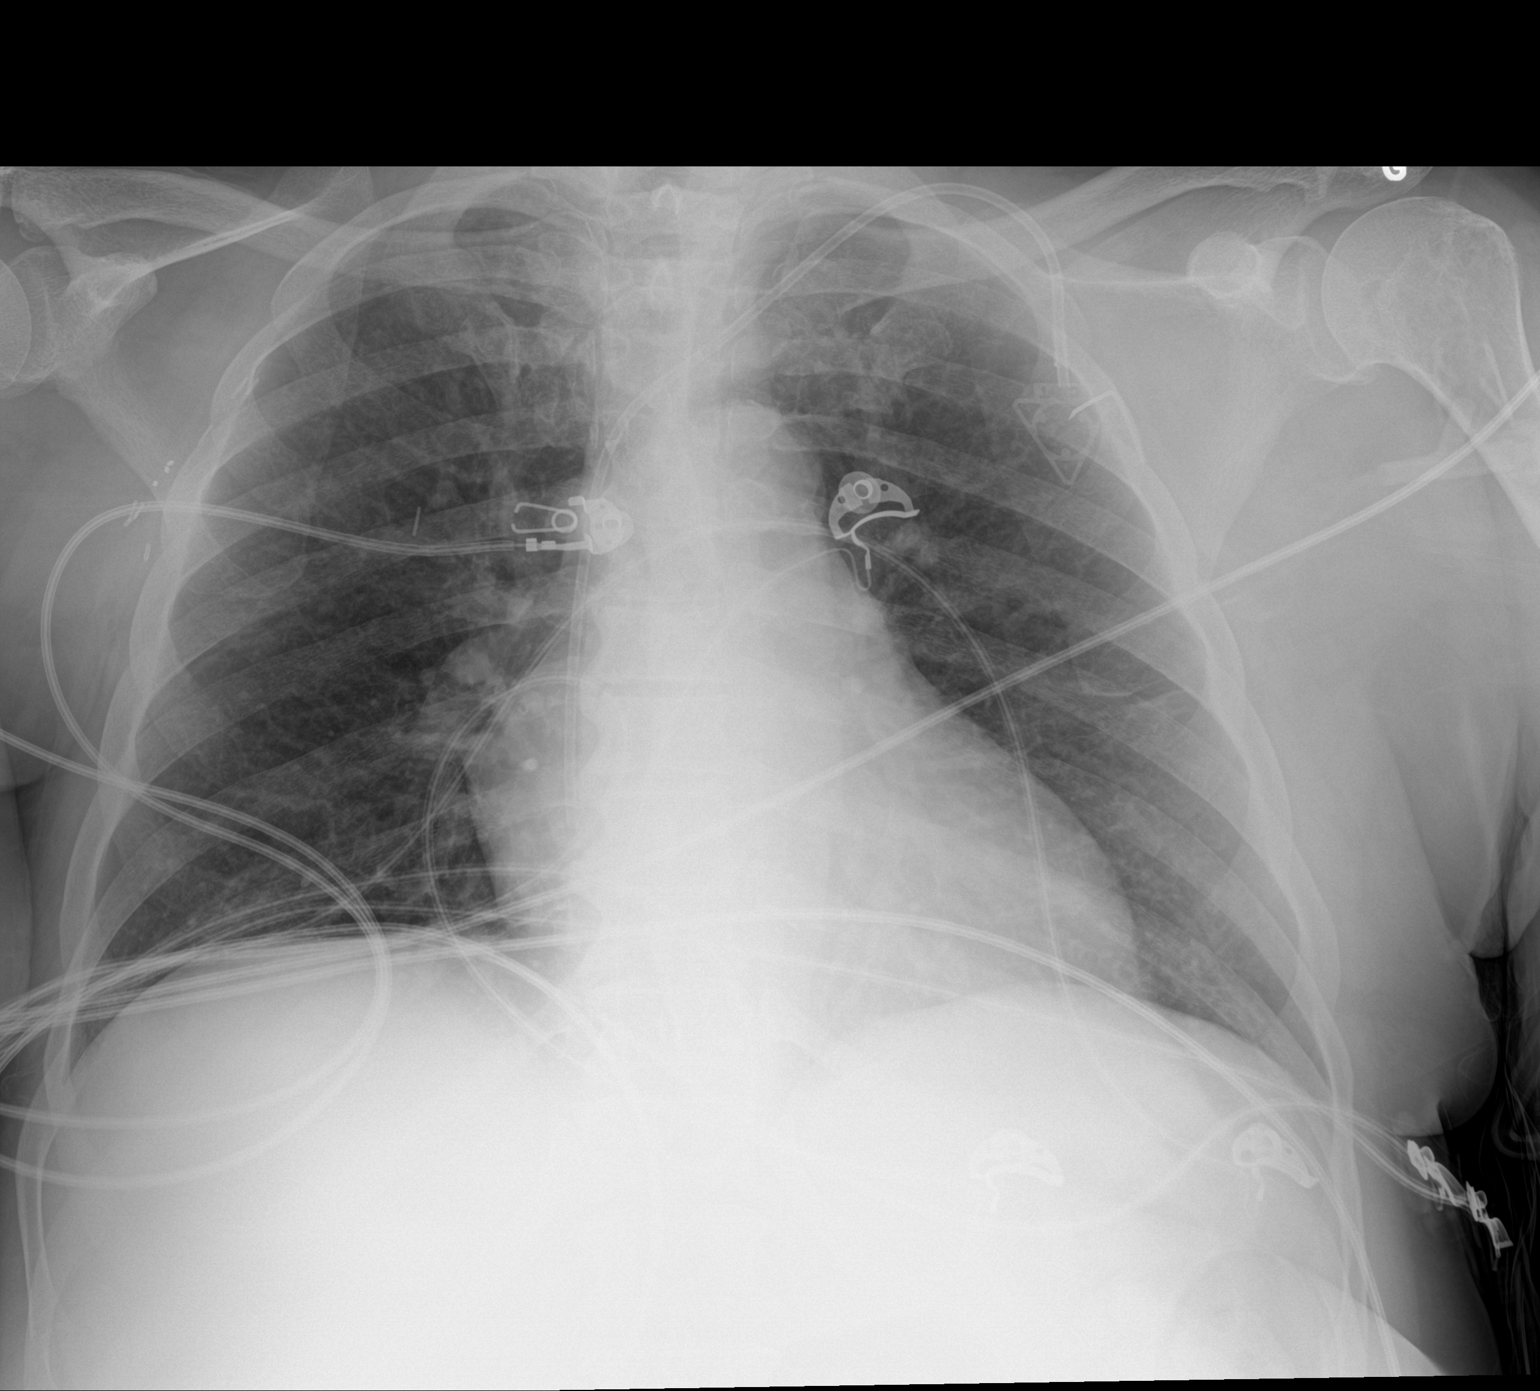

[1 of 1 positions shown; findings below may reference images not displayed]

FINDINGS: Left-sided central venous port tip overlies the right atrium.
Borderline cardiomegaly. No acute opacity or pleural effusion. No
pneumothorax.
IMPRESSION: No active disease.

## 2018-07-02 ENCOUNTER — Telehealth: Payer: Self-pay | Admitting: *Deleted

## 2018-07-02 NOTE — Telephone Encounter (Signed)
Message retrieved at 1135 per VM - message left by pt at 1052 stating " give me a call I had a panic attack and need you to call me "  Return call number given as [phone].  This RN returned call at 65 - with busy signal.  Will attempt again at later time.

## 2018-07-06 ENCOUNTER — Telehealth: Payer: Self-pay | Admitting: *Deleted

## 2018-07-06 MED ORDER — HYDROXYZINE HCL 25 MG PO TABS: 25.0000 mg | ORAL_TABLET | Freq: Three times a day (TID) | ORAL | 0 refills | Status: DC | PRN

## 2018-07-06 NOTE — Telephone Encounter (Signed)
Pt called to this RN stating she called earlier this week as well ( see prior phone note )- stating " I need something for my nerves "  Letica inquired about " that medication he wanted to give me but I never took- what was the name "  This RN stated MD had discussed use of vistaril.  Pt is requesting medication - and per MD inquiry prescription sent to [facility] per pt's request.  Of note - Shawntrice asked if the medication would help her sleep with this RN stating medication can be beneficial for sleep. Jaimy then asked " what is Lorrin Mais ?"   This RN informed her it is a sleeping pill but has to be used very carefully in women due to known side effect of sleep walking that women have occurred injury.  Appointments for 07/11/2018 reviewed with pt as well.  No further needs.

## 2018-07-10 ENCOUNTER — Other Ambulatory Visit: Payer: Self-pay | Admitting: *Deleted

## 2018-07-10 DIAGNOSIS — Z95828 Presence of other vascular implants and grafts: Secondary | ICD-10-CM

## 2018-07-11 ENCOUNTER — Inpatient Hospital Stay: Payer: Commercial Managed Care - PPO | Attending: Oncology

## 2018-07-11 ENCOUNTER — Inpatient Hospital Stay: Payer: Commercial Managed Care - PPO

## 2018-07-11 DIAGNOSIS — C778 Secondary and unspecified malignant neoplasm of lymph nodes of multiple regions: Secondary | ICD-10-CM | POA: Insufficient documentation

## 2018-07-11 DIAGNOSIS — Z5111 Encounter for antineoplastic chemotherapy: Secondary | ICD-10-CM | POA: Insufficient documentation

## 2018-07-11 DIAGNOSIS — Z5112 Encounter for antineoplastic immunotherapy: Secondary | ICD-10-CM | POA: Insufficient documentation

## 2018-07-11 DIAGNOSIS — C792 Secondary malignant neoplasm of skin: Secondary | ICD-10-CM | POA: Insufficient documentation

## 2018-07-11 DIAGNOSIS — Z171 Estrogen receptor negative status [ER-]: Secondary | ICD-10-CM | POA: Insufficient documentation

## 2018-07-11 DIAGNOSIS — C50211 Malignant neoplasm of upper-inner quadrant of right female breast: Secondary | ICD-10-CM | POA: Insufficient documentation

## 2018-07-12 ENCOUNTER — Inpatient Hospital Stay: Payer: Commercial Managed Care - PPO

## 2018-07-12 ENCOUNTER — Telehealth: Payer: Self-pay | Admitting: Oncology

## 2018-07-12 DIAGNOSIS — Z95828 Presence of other vascular implants and grafts: Secondary | ICD-10-CM

## 2018-07-12 DIAGNOSIS — C50211 Malignant neoplasm of upper-inner quadrant of right female breast: Secondary | ICD-10-CM

## 2018-07-12 DIAGNOSIS — Z170 Estrogen receptor positive status [ER+]: Secondary | ICD-10-CM

## 2018-07-12 DIAGNOSIS — C792 Secondary malignant neoplasm of skin: Secondary | ICD-10-CM | POA: Diagnosis not present

## 2018-07-12 DIAGNOSIS — Z5111 Encounter for antineoplastic chemotherapy: Secondary | ICD-10-CM | POA: Diagnosis not present

## 2018-07-12 DIAGNOSIS — Z171 Estrogen receptor negative status [ER-]: Secondary | ICD-10-CM | POA: Diagnosis not present

## 2018-07-12 DIAGNOSIS — Z5112 Encounter for antineoplastic immunotherapy: Secondary | ICD-10-CM | POA: Diagnosis not present

## 2018-07-12 DIAGNOSIS — C778 Secondary and unspecified malignant neoplasm of lymph nodes of multiple regions: Secondary | ICD-10-CM | POA: Diagnosis not present

## 2018-07-12 DIAGNOSIS — C4911 Malignant neoplasm of connective and soft tissue of right upper limb, including shoulder: Secondary | ICD-10-CM

## 2018-07-12 LAB — COMPREHENSIVE METABOLIC PANEL
ALT: 14 U/L (ref 0–44)
AST: 24 U/L (ref 15–41)
Albumin: 3.5 g/dL (ref 3.5–5.0)
Alkaline Phosphatase: 130 U/L — ABNORMAL HIGH (ref 38–126)
Anion gap: 8 (ref 5–15)
BUN: 9 mg/dL (ref 8–23)
CO2: 33 mmol/L — ABNORMAL HIGH (ref 22–32)
Calcium: 9.3 mg/dL (ref 8.9–10.3)
Chloride: 101 mmol/L (ref 98–111)
Creatinine, Ser: 0.87 mg/dL (ref 0.44–1.00)
GFR calc Af Amer: >60 mL/min (ref >60)
GFR calc non Af Amer: >60 mL/min (ref >60)
Glucose, Bld: 81 mg/dL (ref 70–99)
Potassium: 3.7 mmol/L (ref 3.5–5.1)
Sodium: 142 mmol/L (ref 135–145)
Total Bilirubin: <0.2 mg/dL — ABNORMAL LOW (ref 0.3–1.2)
Total Protein: 7.5 g/dL (ref 6.5–8.1)

## 2018-07-12 LAB — CBC WITH DIFFERENTIAL/PLATELET
Basophils Absolute: 0.1 10*3/uL (ref 0.0–0.1)
Basophils Relative: 1 %
Eosinophils Absolute: 0.3 10*3/uL (ref 0.0–0.5)
Eosinophils Relative: 4 %
HCT: 35.1 % (ref 34.8–46.6)
Hemoglobin: 11.4 g/dL — ABNORMAL LOW (ref 11.6–15.9)
Lymphocytes Relative: 19 %
Lymphs Abs: 1.3 10*3/uL (ref 0.9–3.3)
MCH: 26.8 pg (ref 25.1–34.0)
MCHC: 32.4 g/dL (ref 31.5–36.0)
MCV: 82.6 fL (ref 79.5–101.0)
Monocytes Absolute: 0.8 10*3/uL (ref 0.1–0.9)
Monocytes Relative: 11 %
Neutro Abs: 4.5 10*3/uL (ref 1.5–6.5)
Neutrophils Relative %: 65 %
Platelets: 185 10*3/uL (ref 145–400)
RBC: 4.25 MIL/uL (ref 3.70–5.45)
RDW: 16.1 % — ABNORMAL HIGH (ref 11.2–14.5)
WBC: 6.8 10*3/uL (ref 3.9–10.3)

## 2018-07-12 MED ORDER — SODIUM CHLORIDE 0.9 % IJ SOLN
10.0000 mL | INTRAMUSCULAR | Status: DC | PRN
  Administered 2018-07-12: 10 mL via INTRAVENOUS
  Filled 2018-07-12: qty 10

## 2018-07-12 MED ORDER — HEPARIN SOD (PORK) LOCK FLUSH 100 UNIT/ML IV SOLN
500.0000 [IU] | Freq: Once | INTRAVENOUS | Status: AC | PRN
  Administered 2018-07-12: 500 [IU] via INTRAVENOUS
  Filled 2018-07-12: qty 5

## 2018-07-12 NOTE — Patient Instructions (Signed)
Implanted Port Home Guide An implanted port is a type of central line that is placed under the skin. Central lines are used to provide IV access when treatment or nutrition needs to be given through a person's veins. Implanted ports are used for long-term IV access. An implanted port may be placed because:  You need IV medicine that would be irritating to the small veins in your hands or arms.  You need long-term IV medicines, such as antibiotics.  You need IV nutrition for a long period.  You need frequent blood draws for lab tests.  You need dialysis.  Implanted ports are usually placed in the chest area, but they can also be placed in the upper arm, the abdomen, or the leg. An implanted port has two main parts:  Reservoir. The reservoir is round and will appear as a small, raised area under your skin. The reservoir is the part where a needle is inserted to give medicines or draw blood.  Catheter. The catheter is a thin, flexible tube that extends from the reservoir. The catheter is placed into a large vein. Medicine that is inserted into the reservoir goes into the catheter and then into the vein.  How will I care for my incision site? Do not get the incision site wet. Bathe or shower as directed by your health care provider. How is my port accessed? Special steps must be taken to access the port:  Before the port is accessed, a numbing cream can be placed on the skin. This helps numb the skin over the port site.  Your health care provider uses a sterile technique to access the port. ? Your health care provider must put on a mask and sterile gloves. ? The skin over your port is cleaned carefully with an antiseptic and allowed to dry. ? The port is gently pinched between sterile gloves, and a needle is inserted into the port.  Only "non-coring" port needles should be used to access the port. Once the port is accessed, a blood return should be checked. This helps ensure that the port  is in the vein and is not clogged.  If your port needs to remain accessed for a constant infusion, a clear (transparent) bandage will be placed over the needle site. The bandage and needle will need to be changed every week, or as directed by your health care provider.  Keep the bandage covering the needle clean and dry. Do not get it wet. Follow your health care provider's instructions on how to take a shower or bath while the port is accessed.  If your port does not need to stay accessed, no bandage is needed over the port.  What is flushing? Flushing helps keep the port from getting clogged. Follow your health care provider's instructions on how and when to flush the port. Ports are usually flushed with saline solution or a medicine called heparin. The need for flushing will depend on how the port is used.  If the port is used for intermittent medicines or blood draws, the port will need to be flushed: ? After medicines have been given. ? After blood has been drawn. ? As part of routine maintenance.  If a constant infusion is running, the port may not need to be flushed.  How long will my port stay implanted? The port can stay in for as long as your health care provider thinks it is needed. When it is time for the port to come out, surgery will be   done to remove it. The procedure is similar to the one performed when the port was put in. When should I seek immediate medical care? When you have an implanted port, you should seek immediate medical care if:  You notice a bad smell coming from the incision site.  You have swelling, redness, or drainage at the incision site.  You have more swelling or pain at the port site or the surrounding area.  You have a fever that is not controlled with medicine.  This information is not intended to replace advice given to you by your health care provider. Make sure you discuss any questions you have with your health care provider. Document  Released: 10/24/2005 Document Revised: 03/31/2016 Document Reviewed: 07/01/2013 [employer] Interactive Patient Education  2017 [employer]  

## 2018-07-13 ENCOUNTER — Other Ambulatory Visit: Payer: Self-pay | Admitting: *Deleted

## 2018-07-13 ENCOUNTER — Telehealth: Payer: Self-pay | Admitting: *Deleted

## 2018-07-13 MED ORDER — PROCHLORPERAZINE MALEATE 10 MG PO TABS: 10.0000 mg | ORAL_TABLET | Freq: Four times a day (QID) | ORAL | 0 refills | Status: DC | PRN

## 2018-07-13 NOTE — Telephone Encounter (Signed)
Pt receives ECHO every 6 months per herceptin therapy-last echo was 01/22/2018 with normal reading. Proceed with treatment scheduled 07/14/2018 - ECHO and further appointments will be scheduled.

## 2018-07-14 ENCOUNTER — Inpatient Hospital Stay: Payer: Commercial Managed Care - PPO

## 2018-07-14 ENCOUNTER — Other Ambulatory Visit: Payer: Self-pay | Admitting: *Deleted

## 2018-07-14 ENCOUNTER — Encounter: Payer: Self-pay | Admitting: Hematology

## 2018-07-14 VITALS — BP 125/75 | HR 72 | Temp 98.6°F | Resp 18

## 2018-07-14 DIAGNOSIS — Z5112 Encounter for antineoplastic immunotherapy: Secondary | ICD-10-CM | POA: Diagnosis not present

## 2018-07-14 DIAGNOSIS — Z170 Estrogen receptor positive status [ER+]: Secondary | ICD-10-CM

## 2018-07-14 DIAGNOSIS — C50211 Malignant neoplasm of upper-inner quadrant of right female breast: Secondary | ICD-10-CM

## 2018-07-14 DIAGNOSIS — C4911 Malignant neoplasm of connective and soft tissue of right upper limb, including shoulder: Secondary | ICD-10-CM

## 2018-07-14 MED ORDER — PACLITAXEL PROTEIN-BOUND CHEMO INJECTION 100 MG
100.0000 mg/m2 | Freq: Once | INTRAVENOUS | Status: AC
  Administered 2018-07-14: 200 mg via INTRAVENOUS
  Filled 2018-07-14: qty 40

## 2018-07-14 MED ORDER — SODIUM CHLORIDE 0.9 % IV SOLN
Freq: Once | INTRAVENOUS | Status: AC
  Administered 2018-07-14: 09:00:00 via INTRAVENOUS
  Filled 2018-07-14: qty 250

## 2018-07-14 MED ORDER — PROCHLORPERAZINE MALEATE 10 MG PO TABS
ORAL_TABLET | ORAL | Status: AC
  Filled 2018-07-14: qty 1

## 2018-07-14 MED ORDER — PACLITAXEL PROTEIN-BOUND CHEMO INJECTION 100 MG: 100.0000 mg/m2 | Freq: Once | Status: DC

## 2018-07-14 MED ORDER — SODIUM CHLORIDE 0.9% FLUSH
10.0000 mL | INTRAVENOUS | Status: DC | PRN
  Administered 2018-07-14: 10 mL
  Filled 2018-07-14: qty 10

## 2018-07-14 MED ORDER — TRASTUZUMAB CHEMO 150 MG IV SOLR
6.0000 mg/kg | Freq: Once | INTRAVENOUS | Status: AC
  Administered 2018-07-14: 525 mg via INTRAVENOUS
  Filled 2018-07-14: qty 25

## 2018-07-14 MED ORDER — HEPARIN SOD (PORK) LOCK FLUSH 100 UNIT/ML IV SOLN
500.0000 [IU] | Freq: Once | INTRAVENOUS | Status: AC | PRN
  Administered 2018-07-14: 500 [IU]
  Filled 2018-07-14: qty 5

## 2018-07-14 MED ORDER — DIPHENHYDRAMINE HCL 25 MG PO CAPS
25.0000 mg | ORAL_CAPSULE | Freq: Once | ORAL | Status: AC
  Administered 2018-07-14: 25 mg via ORAL

## 2018-07-14 MED ORDER — DIPHENHYDRAMINE HCL 25 MG PO CAPS
ORAL_CAPSULE | ORAL | Status: AC
  Filled 2018-07-14: qty 1

## 2018-07-14 MED ORDER — PROCHLORPERAZINE MALEATE 10 MG PO TABS
10.0000 mg | ORAL_TABLET | Freq: Once | ORAL | Status: AC
  Administered 2018-07-14: 10 mg via ORAL

## 2018-07-14 MED ORDER — ACETAMINOPHEN 325 MG PO TABS
650.0000 mg | ORAL_TABLET | Freq: Once | ORAL | Status: AC
  Administered 2018-07-14: 650 mg via ORAL

## 2018-07-14 MED ORDER — SODIUM CHLORIDE 0.9 % IV SOLN
420.0000 mg | Freq: Once | INTRAVENOUS | Status: AC
  Administered 2018-07-14: 420 mg via INTRAVENOUS
  Filled 2018-07-14: qty 14

## 2018-07-14 MED ORDER — ACETAMINOPHEN 325 MG PO TABS
ORAL_TABLET | ORAL | Status: AC
  Filled 2018-07-14: qty 2

## 2018-07-14 NOTE — Patient Instructions (Signed)
[facility] Discharge Instructions for Patients Receiving Chemotherapy  Today you received the following chemotherapy agents Abraxane,Herceptin,Perjeta  To help prevent nausea and vomiting after your treatment, we encourage you to take your nausea medication as directed  If you develop nausea and vomiting that is not controlled by your nausea medication, call the clinic.   BELOW ARE SYMPTOMS THAT SHOULD BE REPORTED IMMEDIATELY:  *FEVER GREATER THAN 100.5 F  *CHILLS WITH OR WITHOUT FEVER  NAUSEA AND VOMITING THAT IS NOT CONTROLLED WITH YOUR NAUSEA MEDICATION  *UNUSUAL SHORTNESS OF BREATH  *UNUSUAL BRUISING OR BLEEDING  TENDERNESS IN MOUTH AND THROAT WITH OR WITHOUT PRESENCE OF ULCERS  *URINARY PROBLEMS  *BOWEL PROBLEMS  UNUSUAL RASH Items with * indicate a potential emergency and should be followed up as soon as possible.  Feel free to call the clinic should you have any questions or concerns. The clinic phone number is [phone].  Please show the CHEMO ALERT CARD at check-in to the Emergency Department and triage nurse.   

## 2018-07-18 ENCOUNTER — Telehealth: Payer: Self-pay | Admitting: Oncology

## 2018-07-18 NOTE — Telephone Encounter (Signed)
Spoke with patient regarding appts added date/time ok per pt per 9/10 sch msg

## 2018-07-29 ENCOUNTER — Other Ambulatory Visit: Payer: Self-pay | Admitting: Oncology

## 2018-07-31 ENCOUNTER — Other Ambulatory Visit: Payer: Self-pay | Admitting: *Deleted

## 2018-07-31 MED ORDER — POTASSIUM CHLORIDE ER 10 MEQ PO CPCR: ORAL_CAPSULE | ORAL | 2 refills | Status: DC

## 2018-08-07 ENCOUNTER — Inpatient Hospital Stay: Payer: Commercial Managed Care - PPO

## 2018-08-07 ENCOUNTER — Inpatient Hospital Stay: Payer: Commercial Managed Care - PPO | Admitting: Oncology

## 2018-08-07 ENCOUNTER — Telehealth: Payer: Self-pay | Admitting: *Deleted

## 2018-08-07 NOTE — Telephone Encounter (Signed)
See other note

## 2018-08-07 NOTE — Telephone Encounter (Signed)
This RN received VM from pt stating she needs to cancel her appointments today due to her daughter went in to work and Kapri was to take her grandson to school and then come in to the office.  " they wouldn't let him stay at school because his paperwork was caught up so I have him with me "  Aryel stated she wanted to see [name] " before I get any more chemo "  Return call number given as [phone].  MD and infusion made aware.  This RN called pt and rescheduled MD appointment for 10/3 at 3pm lab and 330 MD with [name] verifying date and time.  She does not want to reschedule the chemo at this time due to " every 3 weeks is too much and I was thinking once a month but the [facility] has been telling me every 6 weeks is good "  This RN informed pt above would be discussed at visit with MD.  No further needs at this time.

## 2018-08-08 NOTE — Progress Notes (Signed)
[place]  [phone] [phone]     ID: [name]   DOB: 1955-12-09  MR#: [id number]  MQK#:[id number]  Patient Care Team: [name] as PCP - General (Physician Assistant) [name], [name] Dad, MD as Consulting Physician (Oncology) [name] Pita, MD as Consulting Physician (Radiation Oncology) [name] Klein, MD as Consulting Physician (General Surgery) [name], [name] Pascal, MD as Consulting Physician (Cardiology) [name]: [name]. [name], [name] 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 She is accompanied by her daughter and grandson, [name] today. [name] receives trastuzumab and Pertuzumab, every 28 days, together with Abraxane, which was due on 08/07/2018. She notes that her fingers and hands start hurting, but she has no numbness. She denies neuropathy in her feet. She takes gabapentin, which improves the pain. She also has nausea and vomiting after the chemotherapy.   She was also due for an echocardiogram in September 2019.    REVIEW OF SYSTEMS: [name] reports that she is okay. She has not been sleeping well since taking Atarax for her anxiety, so she discontinued taking this. She notes that the tumors on her back are getting smaller. She notes that she brought her daughter along to her appointment for her daughter to gain understanding about her breast cancer. The patient's daughter is studying to be a Marine scientist. She denies unusual headaches, visual changes, nausea, vomiting, or dizziness. There has been no unusual cough, phlegm production, or pleurisy. There has been no change in bowel or bladder habits. She denies unexplained  fatigue or unexplained weight loss, bleeding, rash, or fever. A detailed review of systems was otherwise stable.    BREAST CANCER HISTORY: From the original intake note:  The patient originally presented in May 2003 when she noticed a lump in the upper inner quadrant of her right breast in September 2003.  She sought attention and had a mammogram which showed an obvious carcinoma in the upper inner quadrant of the right breast, approximately 2 cm.  There were some enlarged lymph nodes in the axilla and an FNA done showed those consistent with malignant cells, most likely an invasive ductal carcinoma.  At that point she was unsure of what to do and was referred by Dr. Marylene [name] for a discussion of her treatment options.  By biopsy, it was ER/PR negative and HER-2 was 3+.  DNA index was 1.42.  We reiterated that it was most important to have her disease surgically addressed at that point and had recommended lumpectomy and axillary nodes to be addressed with which she followed through and on 04-03-02, Dr. Annamaria [name] performed a right partial mastectomy and a right axillary lymph node dissection.  Final pathology revealed a 2.4 cm. high grade, Grade III invasive ductal carcinoma with an adjacent .8 cm. also high grade invasive ductal carcinoma which was felt to represent an intramammary metastasis rather than a second primary.  A smaller mass was just medial to the larger mass.  The smaller mass was associated with high grade DCIS component.  There was no definite lymphovascular invasion identified.  However, one of fourteen axillary lymph nodes did contain a 1.5 cm. metastatic deposit.  Postoperatively she did very well.  We reiterated the fact that she did need adjuvant  chemotherapy, however, she refused and decided that she would pursue radiation.  She received radiation and completed that on 08-14-02. Her subsequent history is as detailed below.   PAST MEDICAL HISTORY: Past Medical History:  Diagnosis Date    . Breast cancer ([facility])   . DVT (deep venous thrombosis) ([place]) 10/07/2011  . Fever 02/06/2018  . Hypertension   . Radiation 11/29/2005-12/29/2005   right supraclavicular area 4147 cGy  . Radiation 06/26/02-08/14/02   right breast 5040 cGy, tumor bed boosted to 1260 cGy  . Rheumatic fever     PAST SURGICAL HISTORY: Past Surgical History:  Procedure Laterality Date  . BREAST SURGERY    . MASTECTOMY     bilateral  . PORT A CATH REVISION      FAMILY HISTORY Family History  Problem Relation Age of Onset  . Pancreatic cancer Mother   . Kidney cancer Sister 72  . Breast cancer Sister 24  . Breast cancer Other 59  The patient's father died in his 19E  from complications of alcohol abuse. The patient's mother died in her 50s from pancreatic cancer. The patient has 6 brothers, 2 sisters. Both her sisters have had breast cancer, both diagnosed after the age of 58. The patient has not had genetic testing so far.   GYNECOLOGIC HISTORY: Menarche age 75; first live birth age 54. She is GXP4. She is not sure when she stopped having periods. She never used hormone replacement.  SOCIAL HISTORY: The patient has worked in the past as a Psychologist, counselling. At home in addition to the patient are her husband [name], originally from [name], who works as a Administrator.  Also living in the home is her daughter [name] (she is studying to be a Marine scientist).  The other 2 children are [name], who has a college degree in psychology and works as a Probation officer; and [name], who lives in [place] and works as a Higher education careers adviser. Son [name] also sometimes stays in the home. In addition the patient has an aide who helps her almost daily.   ADVANCED DIRECTIVES: Not in place  HEALTH MAINTENANCE: Social History   Tobacco Use  . Smoking status: Light Tobacco Smoker  . Smokeless tobacco: Never Used  Substance Use Topics  . Alcohol use: Yes    Comment: Rarely  . Drug use: No     Colonoscopy:  Not on file  PAP:  Not on  file  Bone density:  Not on file  Lipid panel:  Not on file    Allergies  Allergen Reactions  . Pork-Derived Products Other (See Comments)    Pt says she just doesn't eat pork; has no reaction to pork products  . Tramadol Nausea Only  . Hydrocodone-Acetaminophen Itching and Rash    Current Outpatient Medications  Medication Sig Dispense Refill  . ALPRAZolam (XANAX) 1 MG tablet Take 1 tablet (1 mg total) by mouth 2 (two) times daily. 60 tablet 0  . Ascorbic Acid (VITAMIN C) 100 MG tablet Take 100 mg by mouth daily.      . cholecalciferol (VITAMIN D) 1000 units tablet Take 1 tablet (1,000 Units total) by mouth daily. 90 tablet 4  . gabapentin (NEURONTIN) 100 MG capsule Take 1 capsule (100 mg total) by mouth 2 (two) times daily as needed. 60 capsule 5  . hydrOXYzine (ATARAX/VISTARIL) 25 MG tablet TAKE 1 TABLET BY MOUTH THREE TIMES A DAY AS NEEDED 90 tablet 2  . methadone (DOLOPHINE) 10 MG tablet Take 10 mg by mouth 5 (five)  times daily.  0  . Multiple Vitamin (MULTIVITAMIN) capsule Take 1 capsule by mouth daily.      Marland Kitchen oxyCODONE (ROXICODONE) 15 MG immediate release tablet Take 15 mg by mouth 3 (three) times daily.  0  . Oxycodone HCl 10 MG TABS Take 10 mg by mouth 3 (three) times daily.  0  . potassium chloride (MICRO-K) 10 MEQ CR capsule TAKE 2 (TWO) CAPSULES BY MOUTH ONCE DAILY 60 capsule 2  . prochlorperazine (COMPAZINE) 10 MG tablet Take 1 tablet (10 mg total) by mouth every 6 (six) hours as needed for nausea or vomiting. 30 tablet 0  . triamterene-hydrochlorothiazide (DYAZIDE) 37.5-25 MG capsule Take 1 each (1 capsule total) by mouth every morning. 30 capsule 3   No current facility-administered medications for this visit.    Facility-Administered Medications Ordered in Other Visits  Medication Dose Route Frequency Provider Last Rate Last Dose  . sodium chloride 0.9 % injection 10 mL  10 mL Intravenous PRN Pasha Gadison, [name] Dad, MD   10 mL at 03/04/14 1421  . sodium chloride flush  (NS) 0.9 % injection 10 mL  10 mL Intracatheter PRN Azazel Franze, [name] Dad, MD   10 mL at 03/21/18 1616    OBJECTIVE: Middle-aged African-American woman who appears stated age   64:   08/09/18 1525  BP: 119/82  Pulse: 95  Resp: 19  Temp: 99.5 F (37.5 C)  SpO2: 99%     Body mass index is 32.05 kg/m.    ECOG FS: 1 Filed Weights   08/09/18 1525  Weight: 192 lb 9.6 oz (87.4 kg)    Sclerae unicteric, pupils round and equal Lungs no rales or rhonchi Heart regular rate and rhythm Abd soft, nontender, positive bowel sounds MSK no focal spinal tenderness, chronic right upper extremity lymphedema Neuro: nonfocal, well oriented, appropriate affect Breasts: Deferred; she is status post bilateral mastectomies   Photo 07/04/2017      LAB RESULTS:.  Lab Results  Component Value Date   WBC 8.1 08/09/2018   NEUTROABS 5.7 08/09/2018   HGB 11.7 08/09/2018   HCT 36.6 08/09/2018   MCV 83.0 08/09/2018   PLT 225 08/09/2018      Chemistry      Component Value Date/Time   NA 142 07/12/2018 1033   NA 142 11/09/2017 1214   K 3.7 07/12/2018 1033   K 3.9 11/09/2017 1214   CL 101 07/12/2018 1033   CL 101 03/07/2013 1411   CO2 33 (H) 07/12/2018 1033   CO2 32 (H) 11/09/2017 1214   BUN 9 07/12/2018 1033   BUN 19.1 11/09/2017 1214   CREATININE 0.87 07/12/2018 1033   CREATININE 1.04 (H) 05/24/2018 0848   CREATININE 1.0 11/09/2017 1214      Component Value Date/Time   CALCIUM 9.3 07/12/2018 1033   CALCIUM 9.1 11/09/2017 1214   ALKPHOS 130 (H) 07/12/2018 1033   ALKPHOS 120 11/09/2017 1214   AST 24 07/12/2018 1033   AST 17 05/24/2018 0848   AST 30 11/09/2017 1214   ALT 14 07/12/2018 1033   ALT 11 05/24/2018 0848   ALT 19 11/09/2017 1214   BILITOT <0.2 (L) 07/12/2018 1033   BILITOT 0.4 05/24/2018 0848   BILITOT <0.22 11/09/2017 1214       STUDIES: Repeat echo pending ASSESSMENT:  62 y.o. [place] woman with stage IV breast cancer manifested chiefly by loco-regional  nodal disease (neck, chest) and skin involvement, without liver, bone, or brain metastases documented   (1) Status post right upper  inner quadrant lumpectomy and sentinel lymph node sampling 03/04/2002 for 2 separate foci of invasive ductal carcinoma, mpT2 pN1 or stage IIB, both foci grade 3, both estrogen and progesterone receptor positive, both HercepTest 3+, Mib-1 56%  (2) Reexcision for margins 05/27/2002 showed no residual cancer in the breast.  (3) The patient refused adjuvant systemic therapy.  (4) Adjuvant radiation treatment completed 08/14/2002.  (5) recurrence in the right breast in 02/2004 showing a morphologically different tumor, again grade 3, again estrogen and progesterone receptor negative, with an MIB-1 of 14% and Herceptest 3+.  (5) Between 03/2004 and 07/2004 she received dose dense Doxorubicin/Cyclophosphamide x 4 given with trastuzumab, followed by weekly Docetaxel x 8, again given with trastuzumab.  (6) Right mastectomy 07/13/2004 showed scattered microscopic foci of residual disease over an area  greater than 5 cm. Margins were negative.  (7) Postoperative Docetaxel continued until 09/2004.  (8) Trastuzumab (Herceptin) given 08/2004 through 01/2012 with some brief interruptions.  RECURRENT/ STAGE IV DISEASE December 2006 (9) Isolated right cervical nodal recurrence 10/2005, treated with radiation to the right supraclavicular area (total 41.5 gray) completed 12/29/2005.  (10) Navelbine given together with Herceptin  11/2005 through 03/2006.  (9) Left mastectomy 02/13/2006 for ductal carcinoma in situ, grade 2, estrogen and progesterone receptor negative, with negative margins; 0 of 3 lymph nodes involved  (10) treated with Lapatinib and Capecitabine before 10/2009, for an unclear duration and with unclear results (cannot locate data on chart review).  (11) Status post right supraclavicular lymph node biopsy 09/2010 again positive for an invasive ductal carcinoma,  estrogen and progesterone receptor negative, HER-2 positive by [facility] with a ratio 4.25.  (12) Navelbine given together with Herceptin between 05/2011 and 11/2011.  (13) Carboplatin/ Gemcitabine/ Herceptin given for 2 cycles, in 12/2011 and 01/2012.  (14) TDM-1 ([place]) started 02/2012. Last dose 10/02/2013 after which the patient discontinued treatment at her own discretion. Echo on December 2014 showed a well preserved ejection fraction.  (15) Deep vein thrombosis of the right upper extremity documented 04/20/2011.  She completed anticoagulant therapy with Coumadin on 03/25/2013.  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 Left chest Port-A-Cath placed on 04/08/2013; being flushed every 6 weeks  RIGHT UPPER EXTREMITY ANGIOSARCOMA VS BREAST CANCER: August 2016 (18) treated at [facility] of [place] August 2016 with paclitaxel, trastuzumab and pertuzumab 1 cycle, afterwards referred to hospice  (19) under the care of hospice of [facility] August 2016 to 05/08/2016, when the patient opted for a second try at chemotherapy  (20) started low-dose Abraxane, trastuzumab and pertuzumab 06/07/2016, to be repeated every 3 weeks.   (a) pretreatment echocardiogram 06/06/2016 showed a 60-65% ejection fraction  (b) significant compliance problems compromise response to treatment  (c)  echocardiogram 02/20/17 LVEF 55-60%  (d) Abraxane discontinued 07/04/2017 because of possible neuropathy symptoms  (e) Abraxane resumed 09/27/2017  (f) echocardiogram 07/20/2017 showed an ejection fraction of 60-65%  (g) echo 01/22/2018 showed an ejection fraction of 50-55%  (h) CT scan of the chest 05/03/2018 shows no disease involving the lungs liver or lymph nodes, with stable  subcutaneous masses as previously noted  (I) Abraxane discontinued August 2019 because of concerns regarding neuropathy-- gemcitabine substituted  (21) Chronic pain secondary to known metastatic disease  (a) patient signed pain contract 10/19/2016  (b) as of 11/16/2016  receives her pain medications from Dr [name]  (c)  Dr [name] no longer able to prescribe as of February 2019  (d) all pain medications now through [facility] (as of April 2019)   PLAN:  Andrell is now 13 years out from her breast cancer recurrence/stage IV disease.  She maintains a surprisingly good functional status.  She has tolerated anti-HER-2 treatment well.  She is behind on her echocardiography and I have put her in for an echo next week.  In general we are currently doing echoes every 6 months in her case.  I am concerned that she is developing more peripheral neuropathy and would like to discontinue the Abraxane.  We had a very long discussion regarding this today.  She has been reluctant to make any changes at the same time that she is frequently noncompliant.  We finally agreed that we would continue the Herceptin and Perjeta but switch to gemcitabine, stopping the Abraxane.  She will receive her next treatment next week.  She also wanted to move the treatments out to every 6 weeks.  I agreed to move them to every 4 weeks.  There has been some miscommunication between the patient and her daughter.  I hope that was clear today and I gave the daughter the patient's information regarding her cancer in writing  Zelphia's pain is now very competently managed through the [facility].  Chastelyn wanted me to add alprazolam.  I was reluctant to do that.  I have prescribed hydroxyzine for her but she did not feel it was helpful.  Shanterria will return to see me again in about 2 months.  She knows to call for any other issues that may develop before her next visit.   Shilee Biggs, [name] Dad, MD   08/09/18 3:52 PM Medical Oncology and Hematology [facility] [address] [place], [place] [address] Tel. [phone]    Fax. [phone]  Alice Rieger, am acting as scribe for Chauncey Cruel MD.  I, Lurline Del MD, have reviewed the above documentation for accuracy and completeness, and I agree with the above.

## 2018-08-09 ENCOUNTER — Inpatient Hospital Stay: Payer: Commercial Managed Care - PPO

## 2018-08-09 ENCOUNTER — Inpatient Hospital Stay: Payer: Commercial Managed Care - PPO | Attending: Oncology | Admitting: Oncology

## 2018-08-09 ENCOUNTER — Telehealth: Payer: Self-pay | Admitting: Oncology

## 2018-08-09 ENCOUNTER — Other Ambulatory Visit: Payer: Commercial Managed Care - PPO

## 2018-08-09 VITALS — BP 119/82 | HR 95 | Temp 99.5°F | Resp 19 | Ht 65.0 in | Wt 192.6 lb

## 2018-08-09 DIAGNOSIS — Z923 Personal history of irradiation: Secondary | ICD-10-CM | POA: Diagnosis not present

## 2018-08-09 DIAGNOSIS — Z95828 Presence of other vascular implants and grafts: Secondary | ICD-10-CM

## 2018-08-09 DIAGNOSIS — Z5112 Encounter for antineoplastic immunotherapy: Secondary | ICD-10-CM | POA: Diagnosis present

## 2018-08-09 DIAGNOSIS — C50211 Malignant neoplasm of upper-inner quadrant of right female breast: Secondary | ICD-10-CM

## 2018-08-09 DIAGNOSIS — Z9013 Acquired absence of bilateral breasts and nipples: Secondary | ICD-10-CM

## 2018-08-09 DIAGNOSIS — I10 Essential (primary) hypertension: Secondary | ICD-10-CM | POA: Diagnosis not present

## 2018-08-09 DIAGNOSIS — C778 Secondary and unspecified malignant neoplasm of lymph nodes of multiple regions: Secondary | ICD-10-CM | POA: Insufficient documentation

## 2018-08-09 DIAGNOSIS — G629 Polyneuropathy, unspecified: Secondary | ICD-10-CM | POA: Diagnosis not present

## 2018-08-09 DIAGNOSIS — G893 Neoplasm related pain (acute) (chronic): Secondary | ICD-10-CM | POA: Insufficient documentation

## 2018-08-09 DIAGNOSIS — Z86718 Personal history of other venous thrombosis and embolism: Secondary | ICD-10-CM | POA: Insufficient documentation

## 2018-08-09 DIAGNOSIS — C792 Secondary malignant neoplasm of skin: Secondary | ICD-10-CM

## 2018-08-09 DIAGNOSIS — Z170 Estrogen receptor positive status [ER+]: Secondary | ICD-10-CM

## 2018-08-09 DIAGNOSIS — C4911 Malignant neoplasm of connective and soft tissue of right upper limb, including shoulder: Secondary | ICD-10-CM

## 2018-08-09 DIAGNOSIS — F17210 Nicotine dependence, cigarettes, uncomplicated: Secondary | ICD-10-CM | POA: Insufficient documentation

## 2018-08-09 DIAGNOSIS — Z171 Estrogen receptor negative status [ER-]: Secondary | ICD-10-CM | POA: Diagnosis not present

## 2018-08-09 DIAGNOSIS — Z5111 Encounter for antineoplastic chemotherapy: Secondary | ICD-10-CM | POA: Diagnosis not present

## 2018-08-09 DIAGNOSIS — Z79899 Other long term (current) drug therapy: Secondary | ICD-10-CM

## 2018-08-09 LAB — CBC WITH DIFFERENTIAL/PLATELET
Basophils Absolute: 0.1 10*3/uL (ref 0.0–0.1)
Basophils Relative: 1 %
Eosinophils Absolute: 0.1 10*3/uL (ref 0.0–0.5)
Eosinophils Relative: 2 %
HCT: 36.6 % (ref 34.8–46.6)
Hemoglobin: 11.7 g/dL (ref 11.6–15.9)
Lymphocytes Relative: 20 %
Lymphs Abs: 1.6 10*3/uL (ref 0.9–3.3)
MCH: 26.6 pg (ref 25.1–34.0)
MCHC: 32 g/dL (ref 31.5–36.0)
MCV: 83 fL (ref 79.5–101.0)
Monocytes Absolute: 0.6 10*3/uL (ref 0.1–0.9)
Monocytes Relative: 8 %
Neutro Abs: 5.7 10*3/uL (ref 1.5–6.5)
Neutrophils Relative %: 69 %
Platelets: 225 10*3/uL (ref 145–400)
RBC: 4.4 MIL/uL (ref 3.70–5.45)
RDW: 16.1 % — ABNORMAL HIGH (ref 11.2–14.5)
WBC: 8.1 10*3/uL (ref 3.9–10.3)

## 2018-08-09 LAB — COMPREHENSIVE METABOLIC PANEL
ALT: 14 U/L (ref 0–44)
AST: 23 U/L (ref 15–41)
Albumin: 3.8 g/dL (ref 3.5–5.0)
Alkaline Phosphatase: 131 U/L — ABNORMAL HIGH (ref 38–126)
Anion gap: 9 (ref 5–15)
BUN: 12 mg/dL (ref 8–23)
CO2: 31 mmol/L (ref 22–32)
Calcium: 9.7 mg/dL (ref 8.9–10.3)
Chloride: 103 mmol/L (ref 98–111)
Creatinine, Ser: 1.01 mg/dL — ABNORMAL HIGH (ref 0.44–1.00)
GFR calc Af Amer: >60 mL/min (ref >60)
GFR calc non Af Amer: 58 mL/min — ABNORMAL LOW (ref >60)
Glucose, Bld: 120 mg/dL — ABNORMAL HIGH (ref 70–99)
Potassium: 3.9 mmol/L (ref 3.5–5.1)
Sodium: 143 mmol/L (ref 135–145)
Total Bilirubin: 0.3 mg/dL (ref 0.3–1.2)
Total Protein: 8.2 g/dL — ABNORMAL HIGH (ref 6.5–8.1)

## 2018-08-09 MED ORDER — SODIUM CHLORIDE 0.9 % IJ SOLN
10.0000 mL | INTRAMUSCULAR | Status: DC | PRN
  Administered 2018-08-09: 10 mL via INTRAVENOUS
  Filled 2018-08-09: qty 10

## 2018-08-09 MED ORDER — HEPARIN SOD (PORK) LOCK FLUSH 100 UNIT/ML IV SOLN
500.0000 [IU] | Freq: Once | INTRAVENOUS | Status: AC | PRN
  Administered 2018-08-09: 500 [IU] via INTRAVENOUS
  Filled 2018-08-09: qty 5

## 2018-08-09 NOTE — Telephone Encounter (Signed)
Gave avs and calendar ° °

## 2018-08-14 ENCOUNTER — Inpatient Hospital Stay: Payer: Commercial Managed Care - PPO

## 2018-08-14 VITALS — BP 112/68 | HR 72 | Temp 98.2°F | Resp 18

## 2018-08-14 DIAGNOSIS — Z95828 Presence of other vascular implants and grafts: Secondary | ICD-10-CM

## 2018-08-14 DIAGNOSIS — C50211 Malignant neoplasm of upper-inner quadrant of right female breast: Secondary | ICD-10-CM

## 2018-08-14 DIAGNOSIS — Z5112 Encounter for antineoplastic immunotherapy: Secondary | ICD-10-CM | POA: Diagnosis not present

## 2018-08-14 DIAGNOSIS — Z170 Estrogen receptor positive status [ER+]: Secondary | ICD-10-CM

## 2018-08-14 DIAGNOSIS — C4911 Malignant neoplasm of connective and soft tissue of right upper limb, including shoulder: Secondary | ICD-10-CM

## 2018-08-14 LAB — CBC WITH DIFFERENTIAL/PLATELET
Abs Immature Granulocytes: 0.01 10*3/uL (ref 0.00–0.07)
Basophils Absolute: 0 10*3/uL (ref 0.0–0.1)
Basophils Relative: 1 %
Eosinophils Absolute: 0.1 10*3/uL (ref 0.0–0.5)
Eosinophils Relative: 1 %
HCT: 36.4 % (ref 36.0–46.0)
Hemoglobin: 11.5 g/dL — ABNORMAL LOW (ref 12.0–15.0)
Immature Granulocytes: 0 %
Lymphocytes Relative: 15 %
Lymphs Abs: 1 10*3/uL (ref 0.7–4.0)
MCH: 26.7 pg (ref 26.0–34.0)
MCHC: 31.6 g/dL (ref 30.0–36.0)
MCV: 84.5 fL (ref 80.0–100.0)
Monocytes Absolute: 0.5 10*3/uL (ref 0.1–1.0)
Monocytes Relative: 8 %
Neutro Abs: 4.8 10*3/uL (ref 1.7–7.7)
Neutrophils Relative %: 75 %
Platelets: 236 10*3/uL (ref 150–400)
RBC: 4.31 MIL/uL (ref 3.87–5.11)
RDW: 15.7 % — ABNORMAL HIGH (ref 11.5–15.5)
WBC: 6.4 10*3/uL (ref 4.0–10.5)
nRBC: 0 % (ref 0.0–0.2)

## 2018-08-14 LAB — COMPREHENSIVE METABOLIC PANEL
ALT: 10 U/L (ref 0–44)
AST: 21 U/L (ref 15–41)
Albumin: 3.8 g/dL (ref 3.5–5.0)
Alkaline Phosphatase: 123 U/L (ref 38–126)
Anion gap: 9 (ref 5–15)
BUN: 8 mg/dL (ref 8–23)
CO2: 32 mmol/L (ref 22–32)
Calcium: 9.8 mg/dL (ref 8.9–10.3)
Chloride: 101 mmol/L (ref 98–111)
Creatinine, Ser: 0.9 mg/dL (ref 0.44–1.00)
GFR calc Af Amer: >60 mL/min (ref >60)
GFR calc non Af Amer: >60 mL/min (ref >60)
Glucose, Bld: 101 mg/dL — ABNORMAL HIGH (ref 70–99)
Potassium: 3.8 mmol/L (ref 3.5–5.1)
Sodium: 142 mmol/L (ref 135–145)
Total Bilirubin: 0.4 mg/dL (ref 0.3–1.2)
Total Protein: 8 g/dL (ref 6.5–8.1)

## 2018-08-14 MED ORDER — SODIUM CHLORIDE 0.9 % IV SOLN
420.0000 mg | Freq: Once | INTRAVENOUS | Status: AC
  Administered 2018-08-14: 420 mg via INTRAVENOUS
  Filled 2018-08-14: qty 14

## 2018-08-14 MED ORDER — ACETAMINOPHEN 325 MG PO TABS
650.0000 mg | ORAL_TABLET | Freq: Once | ORAL | Status: AC
  Administered 2018-08-14: 650 mg via ORAL

## 2018-08-14 MED ORDER — DIPHENHYDRAMINE HCL 25 MG PO CAPS
25.0000 mg | ORAL_CAPSULE | Freq: Once | ORAL | Status: AC
  Administered 2018-08-14: 25 mg via ORAL

## 2018-08-14 MED ORDER — PROCHLORPERAZINE MALEATE 10 MG PO TABS
ORAL_TABLET | ORAL | Status: AC
  Filled 2018-08-14: qty 1

## 2018-08-14 MED ORDER — ACETAMINOPHEN 325 MG PO TABS
ORAL_TABLET | ORAL | Status: AC
  Filled 2018-08-14: qty 2

## 2018-08-14 MED ORDER — SODIUM CHLORIDE 0.9 % IV SOLN
Freq: Once | INTRAVENOUS | Status: AC
  Administered 2018-08-14: 13:00:00 via INTRAVENOUS
  Filled 2018-08-14: qty 250

## 2018-08-14 MED ORDER — SODIUM CHLORIDE 0.9 % IV SOLN
800.0000 mg/m2 | Freq: Once | INTRAVENOUS | Status: AC
  Administered 2018-08-14: 1634 mg via INTRAVENOUS
  Filled 2018-08-14: qty 42.98

## 2018-08-14 MED ORDER — SODIUM CHLORIDE 0.9 % IJ SOLN
10.0000 mL | INTRAMUSCULAR | Status: DC | PRN
  Administered 2018-08-14: 10 mL via INTRAVENOUS
  Filled 2018-08-14: qty 10

## 2018-08-14 MED ORDER — DIPHENHYDRAMINE HCL 25 MG PO CAPS
ORAL_CAPSULE | ORAL | Status: AC
  Filled 2018-08-14: qty 1

## 2018-08-14 MED ORDER — SODIUM CHLORIDE 0.9% FLUSH
10.0000 mL | INTRAVENOUS | Status: DC | PRN
  Administered 2018-08-14: 10 mL
  Filled 2018-08-14: qty 10

## 2018-08-14 MED ORDER — TRASTUZUMAB CHEMO 150 MG IV SOLR
6.0000 mg/kg | Freq: Once | INTRAVENOUS | Status: AC
  Administered 2018-08-14: 525 mg via INTRAVENOUS
  Filled 2018-08-14: qty 25

## 2018-08-14 MED ORDER — HEPARIN SOD (PORK) LOCK FLUSH 100 UNIT/ML IV SOLN
500.0000 [IU] | Freq: Once | INTRAVENOUS | Status: AC | PRN
  Administered 2018-08-14: 500 [IU]
  Filled 2018-08-14: qty 5

## 2018-08-14 MED ORDER — PROCHLORPERAZINE MALEATE 10 MG PO TABS
10.0000 mg | ORAL_TABLET | Freq: Once | ORAL | Status: AC
  Administered 2018-08-14: 10 mg via ORAL

## 2018-08-14 NOTE — Patient Instructions (Addendum)
[facility] Discharge Instructions for Patients Receiving Chemotherapy. You also recievd herceptin and perjeta.  Today you received the following chemotherapy agentsGemzar To help prevent nausea and vomiting after your treatment, we encourage you to take your nausea medication as directed  If you develop nausea and vomiting that is not controlled by your nausea medication, call the clinic.   BELOW ARE SYMPTOMS THAT SHOULD BE REPORTED IMMEDIATELY:  *FEVER GREATER THAN 100.5 F  *CHILLS WITH OR WITHOUT FEVER  NAUSEA AND VOMITING THAT IS NOT CONTROLLED WITH YOUR NAUSEA MEDICATION  *UNUSUAL SHORTNESS OF BREATH  *UNUSUAL BRUISING OR BLEEDING  TENDERNESS IN MOUTH AND THROAT WITH OR WITHOUT PRESENCE OF ULCERS  *URINARY PROBLEMS  *BOWEL PROBLEMS  UNUSUAL RASH Items with * indicate a potential emergency and should be followed up as soon as possible.  Feel free to call the clinic should you have any questions or concerns. The clinic phone number is [phone].  Please show the [facility] at check-in to the Emergency Department and triage nurse.  Gemcitabine injection What is this medicine? GEMCITABINE (jem SIT a been) is a chemotherapy drug. This medicine is used to treat many types of cancer like breast cancer, lung cancer, pancreatic cancer, and ovarian cancer. This medicine may be used for other purposes; ask your health care provider or pharmacist if you have questions. COMMON BRAND NAME(S): Gemzar What should I tell my health care provider before I take this medicine? They need to know if you have any of these conditions: -blood disorders -infection -kidney disease -liver disease -recent or ongoing radiation therapy -an unusual or allergic reaction to gemcitabine, other chemotherapy, other medicines, foods, dyes, or preservatives -pregnant or trying to get pregnant -breast-feeding How should I use this medicine? This drug is given as an infusion into  a vein. It is administered in a hospital or clinic by a specially trained health care professional. Talk to your pediatrician regarding the use of this medicine in children. Special care may be needed. Overdosage: If you think you have taken too much of this medicine contact a poison control center or emergency room at once. NOTE: This medicine is only for you. Do not share this medicine with others. What if I miss a dose? It is important not to miss your dose. Call your doctor or health care professional if you are unable to keep an appointment. What may interact with this medicine? -medicines to increase blood counts like filgrastim, pegfilgrastim, sargramostim -some other chemotherapy drugs like cisplatin -vaccines Talk to your doctor or health care professional before taking any of these medicines: -acetaminophen -aspirin -ibuprofen -ketoprofen -naproxen This list may not describe all possible interactions. Give your health care provider a list of all the medicines, herbs, non-prescription drugs, or dietary supplements you use. Also tell them if you smoke, drink alcohol, or use illegal drugs. Some items may interact with your medicine. What should I watch for while using this medicine? Visit your doctor for checks on your progress. This drug may make you feel generally unwell. This is not uncommon, as chemotherapy can affect healthy cells as well as cancer cells. Report any side effects. Continue your course of treatment even though you feel ill unless your doctor tells you to stop. In some cases, you may be given additional medicines to help with side effects. Follow all directions for their use. Call your doctor or health care professional for advice if you get a fever, chills or sore throat, or other symptoms of  a cold or flu. Do not treat yourself. This drug decreases your body's ability to fight infections. Try to avoid being around people who are sick. This medicine may increase your  risk to bruise or bleed. Call your doctor or health care professional if you notice any unusual bleeding. Be careful brushing and flossing your teeth or using a toothpick because you may get an infection or bleed more easily. If you have any dental work done, tell your dentist you are receiving this medicine. Avoid taking products that contain aspirin, acetaminophen, ibuprofen, naproxen, or ketoprofen unless instructed by your doctor. These medicines may hide a fever. Women should inform their doctor if they wish to become pregnant or think they might be pregnant. There is a potential for serious side effects to an unborn child. Talk to your health care professional or pharmacist for more information. Do not breast-feed an infant while taking this medicine. What side effects may I notice from receiving this medicine? Side effects that you should report to your doctor or health care professional as soon as possible: -allergic reactions like skin rash, itching or hives, swelling of the face, lips, or tongue -low blood counts - this medicine may decrease the number of white blood cells, red blood cells and platelets. You may be at increased risk for infections and bleeding. -signs of infection - fever or chills, cough, sore throat, pain or difficulty passing urine -signs of decreased platelets or bleeding - bruising, pinpoint red spots on the skin, black, tarry stools, blood in the urine -signs of decreased red blood cells - unusually weak or tired, fainting spells, lightheadedness -breathing problems -chest pain -mouth sores -nausea and vomiting -pain, swelling, redness at site where injected -pain, tingling, numbness in the hands or feet -stomach pain -swelling of ankles, feet, hands -unusual bleeding Side effects that usually do not require medical attention (report to your doctor or health care professional if they continue or are bothersome): -constipation -diarrhea -hair loss -loss of  appetite -stomach upset This list may not describe all possible side effects. Call your doctor for medical advice about side effects. You may report side effects to FDA at 1-800-FDA-1088. Where should I keep my medicine? This drug is given in a hospital or clinic and will not be stored at home. NOTE: This sheet is a summary. It may not cover all possible information. If you have questions about this medicine, talk to your doctor, pharmacist, or health care provider.  2018 Elsevier/Gold Standard (2008-03-04 18:45:54)

## 2018-08-14 NOTE — Progress Notes (Signed)
ECHO appt 08/20/18

## 2018-08-15 ENCOUNTER — Other Ambulatory Visit: Payer: Self-pay | Admitting: Oncology

## 2018-08-16 ENCOUNTER — Other Ambulatory Visit: Payer: Self-pay | Admitting: Oncology

## 2018-08-20 ENCOUNTER — Ambulatory Visit (HOSPITAL_COMMUNITY): Payer: Commercial Managed Care - PPO | Attending: Cardiology

## 2018-08-20 ENCOUNTER — Telehealth: Payer: Self-pay

## 2018-08-20 ENCOUNTER — Other Ambulatory Visit: Payer: Self-pay

## 2018-08-20 DIAGNOSIS — I10 Essential (primary) hypertension: Secondary | ICD-10-CM | POA: Insufficient documentation

## 2018-08-20 DIAGNOSIS — C50211 Malignant neoplasm of upper-inner quadrant of right female breast: Secondary | ICD-10-CM | POA: Insufficient documentation

## 2018-08-20 DIAGNOSIS — C4911 Malignant neoplasm of connective and soft tissue of right upper limb, including shoulder: Secondary | ICD-10-CM

## 2018-08-20 DIAGNOSIS — Z170 Estrogen receptor positive status [ER+]: Secondary | ICD-10-CM | POA: Diagnosis present

## 2018-08-20 NOTE — Telephone Encounter (Signed)
Called pt to check on her regarding her call to our triage nurse over the weekend reporting sores in her mouth.  No answer.  LVM for pt to return call.

## 2018-08-22 ENCOUNTER — Telehealth: Payer: Self-pay | Admitting: *Deleted

## 2018-08-22 NOTE — Progress Notes (Signed)
[place]  Telephone:(336) [phone] Fax:(336) [phone]     ID: Keriana Sarsfield   DOB: [birth year]  MR#: [id number]  AJG#:[id number]  Patient Care Team: Tawny Asal as PCP - General (Physician Assistant) Britain Anagnos, [name], MD as Consulting Physician (Oncology) [name], MD as Consulting Physician (Radiation Oncology) [name], [name], MD as Consulting Physician (Cardiology) [name], [name] 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 Makena receives gemcitabine (switched from abraxane after cycle 11), trastuzumab and pertuzumab every 28 days. Today is day 10 of cycle 12. She tolerated this well . She feels that her skin is more sensitive and itchy. She denies having nausea or diarrhea. The neuropathy in her right arm feels better, although it is still present from the right elbow to the wrist. Her back pain has also improved. She has chronic pain in th left shoulder.   Her echocardiogram on 08/20/2018 shows an ejection fraction in the 55-60% range   REVIEW OF SYSTEMS: Shalyn reports that her chest and neck are persistently itching. She had this itching before the chemotherapy started, but she feels more skin sensitivity.  She notes that her right arm was swollen last night, and she put her compression sleeve on. This was after an argument with her daughter. When she woke up in the morning, she had bumps in her elbow crease, which resolved after she took the compression sleeve off. She felt the sleeve was tight on her arm.  She has been stressed planning a cruise to [place] with her family in December. She notes that the stress is mostly  financially related, but she feels that she should be more enthusiastic about it.  She denies unusual headaches, visual changes, nausea, vomiting, or dizziness. There has been no unusual cough, phlegm production, or pleurisy. There has been no change in bowel or bladder habits. She denies unexplained fatigue or unexplained weight loss, bleeding, rash, or fever. A detailed review of systems was otherwise stable.    BREAST CANCER HISTORY: From the original intake note:  The patient originally presented in May 2003 when she noticed a lump in the upper inner quadrant of her right breast in September 2003.  She sought attention and had a mammogram which showed an obvious carcinoma in the upper inner quadrant of the right breast, approximately 2 cm.  There were some enlarged lymph nodes in the axilla and an FNA done showed those consistent with malignant cells, most likely an invasive ductal carcinoma.  At that point she was unsure of what to do and was referred by Dr. [name] for a discussion of her treatment options.  By biopsy, it was ER/PR negative and HER-2 was 3+.  DNA index was 1.42.  We reiterated that it was most important to have her disease surgically addressed at that point and had recommended lumpectomy and axillary nodes to be addressed with which she followed through and on 04-03-02, Dr. [name] performed a right partial mastectomy and a right axillary lymph node dissection.  Final pathology revealed a 2.4 cm. high grade, Grade III invasive ductal carcinoma with an adjacent .8 cm. also high grade invasive ductal carcinoma which was felt to represent an intramammary metastasis rather than a  second primary.  A smaller mass was just medial to the larger mass.  The smaller mass was associated with high grade DCIS component.  There was no definite lymphovascular invasion identified.  However, one of fourteen axillary lymph nodes did contain a 1.5 cm. metastatic deposit.  Postoperatively she did very  well.  We reiterated the fact that she did need adjuvant chemotherapy, however, she refused and decided that she would pursue radiation.  She received radiation and completed that on 08-14-02. Her subsequent history is as detailed below.   PAST MEDICAL HISTORY: Past Medical History:  Diagnosis Date  . Breast cancer ([place])   . DVT (deep venous thrombosis) ([place]) 10/07/2011  . Fever 02/06/2018  . Hypertension   . Radiation 11/29/2005-12/29/2005   right supraclavicular area 4147 cGy  . Radiation 06/26/02-08/14/02   right breast 5040 cGy, tumor bed boosted to 1260 cGy  . Rheumatic fever     PAST SURGICAL HISTORY: Past Surgical History:  Procedure Laterality Date  . BREAST SURGERY    . MASTECTOMY     bilateral  . PORT A CATH REVISION      FAMILY HISTORY Family History  Problem Relation Age of Onset  . Pancreatic cancer Mother   . Kidney cancer Sister 22  . Breast cancer Sister 21  . Breast cancer Other 53  The patient's father died in his 09B  from complications of alcohol abuse. The patient's mother died in her 37s from pancreatic cancer. The patient has 6 brothers, 2 sisters. Both her sisters have had breast cancer, both diagnosed after the age of 66. The patient has not had genetic testing so far.   GYNECOLOGIC HISTORY: Menarche age 19; first live birth age 31. She is GXP4. She is not sure when she stopped having periods. She never used hormone replacement.  SOCIAL HISTORY: The patient has worked in the past as a Psychologist, counselling. At home in addition to the patient are her husband [name], originally from [place], who works as a Administrator.  Also living in the home is her daughter [name] (she is studying to be a Marine scientist).  The other 2 children are [name], who has a college degree in psychology and works as a Probation officer; and [name], who lives in [place] and works as a Higher education careers adviser. [name] also sometimes stays in the home. In addition the patient has an aide who helps her almost  daily.   ADVANCED DIRECTIVES: Not in place  HEALTH MAINTENANCE: Social History   Tobacco Use  . Smoking status: Light Tobacco Smoker  . Smokeless tobacco: Never Used  Substance Use Topics  . Alcohol use: Yes    Comment: Rarely  . Drug use: No     Colonoscopy:  Not on file  PAP:  Not on file  Bone density:  Not on file  Lipid panel:  Not on file    Allergies  Allergen Reactions  . Pork-Derived Products Other (See Comments)    Pt says she just doesn't eat pork; has no reaction to pork products  . Tramadol Nausea Only  . Hydrocodone-Acetaminophen Itching and Rash    Current Outpatient Medications  Medication Sig Dispense Refill  . ALPRAZolam (XANAX) 1 MG tablet Take 1 tablet (1 mg total) by mouth 2 (two) times daily. 60 tablet 0  . Ascorbic Acid (VITAMIN C) 100 MG tablet Take 100 mg by mouth daily.      . cholecalciferol (VITAMIN D) 1000 units tablet Take 1 tablet (1,000 Units total) by mouth daily.  90 tablet 4  . gabapentin (NEURONTIN) 100 MG capsule Take 1 capsule (100 mg total) by mouth 2 (two) times daily as needed. 60 capsule 5  . hydrOXYzine (ATARAX/VISTARIL) 25 MG tablet TAKE 1 TABLET BY MOUTH THREE TIMES A DAY AS NEEDED 270 tablet 1  . methadone (DOLOPHINE) 10 MG tablet Take 10 mg by mouth 5 (five) times daily.  0  . Multiple Vitamin (MULTIVITAMIN) capsule Take 1 capsule by mouth daily.      Marland Kitchen oxyCODONE (ROXICODONE) 15 MG immediate release tablet Take 15 mg by mouth 3 (three) times daily.  0  . Oxycodone HCl 10 MG TABS Take 10 mg by mouth 3 (three) times daily.  0  . potassium chloride (MICRO-K) 10 MEQ CR capsule TAKE 2 (TWO) CAPSULES BY MOUTH ONCE DAILY 60 capsule 2  . prochlorperazine (COMPAZINE) 10 MG tablet Take 1 tablet (10 mg total) by mouth every 6 (six) hours as needed for nausea or vomiting. 30 tablet 0  . triamterene-hydrochlorothiazide (DYAZIDE) 37.5-25 MG capsule Take 1 each (1 capsule total) by mouth every morning. 30 capsule 3   No current  facility-administered medications for this visit.    Facility-Administered Medications Ordered in Other Visits  Medication Dose Route Frequency Provider Last Rate Last Dose  . sodium chloride 0.9 % injection 10 mL  10 mL Intravenous PRN Honey Zakarian, [name], MD   10 mL at 03/04/14 1421  . sodium chloride flush (NS) 0.9 % injection 10 mL  10 mL Intracatheter PRN Seline Enzor, [name], MD   10 mL at 03/21/18 1616    OBJECTIVE: Middle-aged African-American woman in no acute distress   Vitals:   08/23/18 1545  BP: 120/69  Pulse: 89  Resp: 19  Temp: 98.4 F (36.9 C)  SpO2: 97%     Body mass index is 31.97 kg/m.    ECOG FS: 1 Filed Weights   08/23/18 1545  Weight: 192 lb 1.6 oz (87.1 kg)    Sclerae unicteric, EOMs intact Lungs no rales or rhonchi Heart regular rate and rhythm Abd soft, nontender, positive bowel sounds MSK no focal spinal tenderness, no upper extremity lymphedema Neuro: nonfocal, well oriented, appropriate affect Breasts: No evidence of chest wall recurrence Skin: See photo below.  Overall the skin and right arm lesions are much improved   Photo 07/04/2017     Photo 08/23/2018          LAB RESULTS:.  Lab Results  Component Value Date   WBC 7.5 08/23/2018   NEUTROABS 4.5 08/23/2018   HGB 11.5 (L) 08/23/2018   HCT 36.0 08/23/2018   MCV 84.5 08/23/2018   PLT 184 08/23/2018      Chemistry      Component Value Date/Time   NA 142 08/14/2018 1236   NA 142 11/09/2017 1214   K 3.8 08/14/2018 1236   K 3.9 11/09/2017 1214   CL 101 08/14/2018 1236   CL 101 03/07/2013 1411   CO2 32 08/14/2018 1236   CO2 32 (H) 11/09/2017 1214   BUN 8 08/14/2018 1236   BUN 19.1 11/09/2017 1214   CREATININE 0.90 08/14/2018 1236   CREATININE 1.04 (H) 05/24/2018 0848   CREATININE 1.0 11/09/2017 1214      Component Value Date/Time   CALCIUM 9.8 08/14/2018 1236   CALCIUM 9.1 11/09/2017 1214   ALKPHOS 123 08/14/2018 1236   ALKPHOS 120 11/09/2017 1214   AST 21  08/14/2018 1236   AST 17 05/24/2018 0848   AST 30 11/09/2017 1214  ALT 10 08/14/2018 1236   ALT 11 05/24/2018 0848   ALT 19 11/09/2017 1214   BILITOT 0.4 08/14/2018 1236   BILITOT 0.4 05/24/2018 0848   BILITOT <0.22 11/09/2017 1214       STUDIES: echocardiogram 08/20/2018 shows an ejection fraction in the 55-60% range  ASSESSMENT:  62 y.o. [place] woman with stage IV breast cancer manifested chiefly by loco-regional nodal disease (neck, chest) and skin involvement, without liver, bone, or brain metastases documented   (1) Status post right upper inner quadrant lumpectomy and sentinel lymph node sampling 03/04/2002 for 2 separate foci of invasive ductal carcinoma, mpT2 pN1 or stage IIB, both foci grade 3, both estrogen and progesterone receptor positive, both HercepTest 3+, Mib-1 56%  (2) Reexcision for margins 05/27/2002 showed no residual cancer in the breast.  (3) The patient refused adjuvant systemic therapy.  (4) Adjuvant radiation treatment completed 08/14/2002.  (5) recurrence in the right breast in 02/2004 showing a morphologically different tumor, again grade 3, again estrogen and progesterone receptor negative, with an MIB-1 of 14% and Herceptest 3+.  (5) Between 03/2004 and 07/2004 she received dose dense Doxorubicin/Cyclophosphamide x 4 given with trastuzumab, followed by weekly Docetaxel x 8, again given with trastuzumab.  (6) Right mastectomy 07/13/2004 showed scattered microscopic foci of residual disease over an area  greater than 5 cm. Margins were negative.  (7) Postoperative Docetaxel continued until 09/2004.  (8) Trastuzumab (Herceptin) given 08/2004 through 01/2012 with some brief interruptions.  RECURRENT/ STAGE IV DISEASE December 2006 (9) Isolated right cervical nodal recurrence 10/2005, treated with radiation to the right supraclavicular area (total 41.5 gray) completed 12/29/2005.  (10) Navelbine given together with Herceptin  11/2005 through  03/2006.  (9) Left mastectomy 02/13/2006 for ductal carcinoma in situ, grade 2, estrogen and progesterone receptor negative, with negative margins; 0 of 3 lymph nodes involved  (10) treated with Lapatinib and Capecitabine before 10/2009, for an unclear duration and with unclear results (cannot locate data on chart review).  (11) Status post right supraclavicular lymph node biopsy 09/2010 again positive for an invasive ductal carcinoma, estrogen and progesterone receptor negative, HER-2 positive by CISH with a ratio 4.25.  (12) Navelbine given together with Herceptin between 05/2011 and 11/2011.  (13) Carboplatin/ Gemcitabine/ Herceptin given for 2 cycles, in 12/2011 and 01/2012.  (14) TDM-1 ([place]) started 02/2012. Last dose 10/02/2013 after which the patient discontinued treatment at her own discretion. Echo on December 2014 showed a well preserved ejection fraction.  (15) Deep vein thrombosis of the right upper extremity documented 04/20/2011.  She completed anticoagulant therapy with Coumadin on 03/25/2013.  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 Left chest Port-A-Cath placed on 04/08/2013; being flushed every 6 weeks  RIGHT UPPER EXTREMITY ANGIOSARCOMA VS BREAST CANCER: August 2016 (18) treated at [facility] of [place] August 2016 with paclitaxel, trastuzumab and pertuzumab 1 cycle, afterwards referred to hospice  (19) under the care of hospice of [facility] August 2016 to 05/08/2016, when the patient opted for a second try at chemotherapy  (20) started low-dose Abraxane, trastuzumab and pertuzumab 06/07/2016, to be repeated every 3 weeks.   (a) pretreatment echocardiogram 06/06/2016 showed a 60-65% ejection fraction  (b) significant  compliance problems compromise response to treatment  (c)  echocardiogram 02/20/17 LVEF 55-60%  (d) Abraxane discontinued 07/04/2017 because  of possible neuropathy symptoms  (e) Abraxane resumed 09/27/2017  (f) echocardiogram 07/20/2017 showed an ejection fraction of 60-65%  (g) echo 01/22/2018 showed an ejection fraction of 50-55%  (h) CT scan of the chest 05/03/2018 shows no disease involving the lungs liver or lymph nodes, with stable subcutaneous masses as previously noted  (I) Abraxane discontinued August 2019 because of concerns regarding neuropathy-- gemcitabine substituted  (j) echocardiogram 08/20/2018 shows an ejection fraction in the 55-60% range  (21) Chronic pain secondary to known metastatic disease  (a) patient signed pain contract 10/19/2016  (b) as of 11/16/2016 receives her pain medications from Dr [name]  (c)  Dr [name] no longer able to prescribe as of February 2019  (d) all pain medications now through [facility] (as of April 2019)   PLAN:  Deneshia is tolerating the gemcitabine well and of course she continues to tolerate the anti-HER-2 treatment well also.  Her echocardiogram shows a normal ejection fraction.  I gave her written information on the gemcitabine.  If it is really causing itching we will have to discontinue it.  I am pleased that the neuropathy seems to be improving.  I am delighted that she is going to be taking a cruise to the [place].  It is going to delay her October 09, 2018 treatment.  We will move that 2 weeks until a few days after she returns.  I photographed her back today so it will serve as a new baseline.  She knows to call for any other issues that may develop before her next visit.  Ishmel Acevedo, [name], MD  08/23/18 4:11 PM Medical Oncology and Hematology [facility] [address] [place], [place] [address] Tel. [phone]    Fax. [phone]  Alice Rieger, am acting as scribe for  Chauncey Cruel MD.  I, Lurline Del MD, have reviewed the above documentation for accuracy and completeness, and I agree with the above.

## 2018-08-22 NOTE — Telephone Encounter (Signed)
VM received from pt requesting a return call from nurse.  This RN returned call and obtained identified VM- message left call was being returned. Office number given for pt to call again as well as informed her if concern more urgent she should request to speak to the Triage nurse, if she receives this RN's VM she should leave information regarding concern.

## 2018-08-23 ENCOUNTER — Telehealth: Payer: Self-pay | Admitting: Oncology

## 2018-08-23 ENCOUNTER — Other Ambulatory Visit: Payer: Self-pay | Admitting: Oncology

## 2018-08-23 ENCOUNTER — Inpatient Hospital Stay (HOSPITAL_BASED_OUTPATIENT_CLINIC_OR_DEPARTMENT_OTHER): Payer: Commercial Managed Care - PPO | Admitting: Oncology

## 2018-08-23 ENCOUNTER — Inpatient Hospital Stay: Payer: Commercial Managed Care - PPO

## 2018-08-23 VITALS — BP 120/69 | HR 89 | Temp 98.4°F | Resp 19 | Ht 65.0 in | Wt 192.1 lb

## 2018-08-23 DIAGNOSIS — C50211 Malignant neoplasm of upper-inner quadrant of right female breast: Secondary | ICD-10-CM | POA: Diagnosis not present

## 2018-08-23 DIAGNOSIS — G893 Neoplasm related pain (acute) (chronic): Secondary | ICD-10-CM

## 2018-08-23 DIAGNOSIS — Z86718 Personal history of other venous thrombosis and embolism: Secondary | ICD-10-CM

## 2018-08-23 DIAGNOSIS — Z170 Estrogen receptor positive status [ER+]: Secondary | ICD-10-CM

## 2018-08-23 DIAGNOSIS — C778 Secondary and unspecified malignant neoplasm of lymph nodes of multiple regions: Secondary | ICD-10-CM | POA: Diagnosis not present

## 2018-08-23 DIAGNOSIS — C4911 Malignant neoplasm of connective and soft tissue of right upper limb, including shoulder: Secondary | ICD-10-CM

## 2018-08-23 DIAGNOSIS — C792 Secondary malignant neoplasm of skin: Secondary | ICD-10-CM

## 2018-08-23 DIAGNOSIS — G629 Polyneuropathy, unspecified: Secondary | ICD-10-CM

## 2018-08-23 DIAGNOSIS — F17210 Nicotine dependence, cigarettes, uncomplicated: Secondary | ICD-10-CM

## 2018-08-23 DIAGNOSIS — Z923 Personal history of irradiation: Secondary | ICD-10-CM

## 2018-08-23 DIAGNOSIS — I10 Essential (primary) hypertension: Secondary | ICD-10-CM

## 2018-08-23 DIAGNOSIS — Z171 Estrogen receptor negative status [ER-]: Secondary | ICD-10-CM

## 2018-08-23 DIAGNOSIS — Z5112 Encounter for antineoplastic immunotherapy: Secondary | ICD-10-CM | POA: Diagnosis not present

## 2018-08-23 LAB — CBC WITH DIFFERENTIAL/PLATELET
Abs Immature Granulocytes: 0.06 10*3/uL (ref 0.00–0.07)
Basophils Absolute: 0 10*3/uL (ref 0.0–0.1)
Basophils Relative: 0 %
Eosinophils Absolute: 0.1 10*3/uL (ref 0.0–0.5)
Eosinophils Relative: 1 %
HCT: 36 % (ref 36.0–46.0)
Hemoglobin: 11.5 g/dL — ABNORMAL LOW (ref 12.0–15.0)
Immature Granulocytes: 1 %
Lymphocytes Relative: 30 %
Lymphs Abs: 2.3 10*3/uL (ref 0.7–4.0)
MCH: 27 pg (ref 26.0–34.0)
MCHC: 31.9 g/dL (ref 30.0–36.0)
MCV: 84.5 fL (ref 80.0–100.0)
Monocytes Absolute: 0.5 10*3/uL (ref 0.1–1.0)
Monocytes Relative: 7 %
Neutro Abs: 4.5 10*3/uL (ref 1.7–7.7)
Neutrophils Relative %: 61 %
Platelets: 184 10*3/uL (ref 150–400)
RBC: 4.26 MIL/uL (ref 3.87–5.11)
RDW: 15.1 % (ref 11.5–15.5)
WBC: 7.5 10*3/uL (ref 4.0–10.5)
nRBC: 0 % (ref 0.0–0.2)

## 2018-08-23 NOTE — Telephone Encounter (Signed)
Gave avs and calendar ° °

## 2018-08-27 ENCOUNTER — Other Ambulatory Visit: Payer: Self-pay | Admitting: *Deleted

## 2018-08-27 ENCOUNTER — Telehealth: Payer: Self-pay | Admitting: *Deleted

## 2018-08-27 DIAGNOSIS — G629 Polyneuropathy, unspecified: Secondary | ICD-10-CM

## 2018-08-27 MED ORDER — GABAPENTIN 300 MG PO CAPS: 300.0000 mg | ORAL_CAPSULE | Freq: Two times a day (BID) | ORAL | 1 refills | Status: DC | PRN

## 2018-09-05 ENCOUNTER — Telehealth: Payer: Self-pay | Admitting: *Deleted

## 2018-09-05 NOTE — Telephone Encounter (Signed)
Deriyah left 3 VM stating she doesn't want to get the Gemzar with her next chemo " but I want to go back to what I had before the Abraxane "  Wadie states she has " felt awful since getting the Gemzar, achy all over and just not doing well  And I want to feel good for my trip "  This RN discussed concern per her discussion at MD visit and issues she stated regarding neuropathy. Abraxane can worsen the neuropathy and may be permanent.  Ruthann states the word " paralysis " when we talk about neuropathy and per this call she states " my paralysis hasn't gotten worse, it is the same for years - I just mentioned it because I wanted to increase the neurontin dose "  Per discussion - Katheline is comfortable just getting the herceptin and perjeta at her next treatment " because I do not want to feel bad for my trip "  This RN informed pt above would be reviewed and call returned tomorrow.  No other request or needs at this time.

## 2018-09-10 ENCOUNTER — Other Ambulatory Visit: Payer: Self-pay | Admitting: Oncology

## 2018-09-10 ENCOUNTER — Telehealth: Payer: Self-pay | Admitting: Medical Oncology

## 2018-09-10 NOTE — Progress Notes (Signed)
DISCONTINUE OFF PATHWAY REGIMEN - Breast   [id number]:Nab-Paclitaxel (Abraxane(R)) 100 mg/m2 D1,8,15 q28 days:   A cycle is every 28 days (3 weeks on and 1 week off):     Nab-paclitaxel (protein bound)   **Always confirm dose/schedule in your pharmacy ordering system**  REASON: Other Reason PRIOR TREATMENT: Off Pathway: Nab-Paclitaxel (Abraxane(R)) 100 mg/m2 D1,8,15 q28 days TREATMENT RESPONSE: Stable Disease (SD)  START OFF PATHWAY REGIMEN - Breast   [id number]:Nab-Paclitaxel (Abraxane(R)) 100 mg/m2 D1,8,15 q28 days:   A cycle is every 28 days (3 weeks on and 1 week off):     Nab-paclitaxel (protein bound)   **Always confirm dose/schedule in your pharmacy ordering system**  Patient Characteristics: Distant Metastases or Locoregional Recurrent Disease - Unresected or Locally Advanced Unresectable Disease Progressing after Neoadjuvant and Local Therapies, HER2 Positive, ER Negative/Unknown, Chemotherapy, Fourth Line and Beyond Therapeutic Status: Distant Metastases BRCA Mutation Status: Did Not Order Test ER Status: Negative (-) HER2 Status: Positive (+) PR Status: Negative (-) Line of Therapy: Fourth Line and Beyond Intent of Therapy: Non-Curative / Palliative Intent, Discussed with Patient

## 2018-09-10 NOTE — Telephone Encounter (Addendum)
Chemo tomorrow -Refuses Gemzar-wants appt with Provider tomorrow- "I don't want any more Gemzar because I have to many side effects - it makes me dizzy, my stools are dark and the tumors on my back are trying to bust open". She just wants perjeta , herceptin and that she "needs abraxane". Pt denies blood in stool or tarry stool.  I told pt to keep her appt tomorrowPt going away for one month after her tx.  Her next provider appt is  12/17.

## 2018-09-10 NOTE — Progress Notes (Signed)
OFF PATHWAY REGIMEN - Breast  No Change  Continue With Treatment as Ordered.   [id number]:Nab-Paclitaxel (Abraxane(R)) 100 mg/m2 D1,8,15 q28 days:   A cycle is every 28 days (3 weeks on and 1 week off):     Nab-paclitaxel (protein bound)   **Always confirm dose/schedule in your pharmacy ordering system**  Patient Characteristics: Metastatic Chemotherapy, HER2 Positive, ER Negative/Unknown, Fourth Line and Beyond Therapeutic Status: Distant Metastases BRCA Mutation Status: Did Not Order Test ER Status: Negative (-) HER2 Status: Positive (+) PR Status: Negative (-) Line of therapy: Fourth Line and Beyond Intent of Therapy: Non-Curative / Palliative Intent, Discussed with Patient

## 2018-09-10 NOTE — Telephone Encounter (Signed)
Per [name] I sent schedule request for provider  appt tomorrow and he will see pt  in infusion - he changed chemo to abraxane.

## 2018-09-11 ENCOUNTER — Telehealth: Payer: Self-pay | Admitting: *Deleted

## 2018-09-11 ENCOUNTER — Inpatient Hospital Stay (HOSPITAL_BASED_OUTPATIENT_CLINIC_OR_DEPARTMENT_OTHER): Payer: Commercial Managed Care - PPO | Admitting: Oncology

## 2018-09-11 ENCOUNTER — Inpatient Hospital Stay: Payer: Commercial Managed Care - PPO

## 2018-09-11 ENCOUNTER — Telehealth: Payer: Self-pay | Admitting: Oncology

## 2018-09-11 ENCOUNTER — Inpatient Hospital Stay: Payer: Commercial Managed Care - PPO | Attending: Oncology

## 2018-09-11 ENCOUNTER — Other Ambulatory Visit: Payer: Self-pay | Admitting: *Deleted

## 2018-09-11 VITALS — BP 110/76 | HR 76 | Temp 98.5°F | Resp 14

## 2018-09-11 DIAGNOSIS — C778 Secondary and unspecified malignant neoplasm of lymph nodes of multiple regions: Secondary | ICD-10-CM

## 2018-09-11 DIAGNOSIS — F17210 Nicotine dependence, cigarettes, uncomplicated: Secondary | ICD-10-CM | POA: Insufficient documentation

## 2018-09-11 DIAGNOSIS — Z5112 Encounter for antineoplastic immunotherapy: Secondary | ICD-10-CM | POA: Insufficient documentation

## 2018-09-11 DIAGNOSIS — Z170 Estrogen receptor positive status [ER+]: Secondary | ICD-10-CM

## 2018-09-11 DIAGNOSIS — G4700 Insomnia, unspecified: Secondary | ICD-10-CM | POA: Diagnosis not present

## 2018-09-11 DIAGNOSIS — C50211 Malignant neoplasm of upper-inner quadrant of right female breast: Secondary | ICD-10-CM

## 2018-09-11 DIAGNOSIS — Z5111 Encounter for antineoplastic chemotherapy: Secondary | ICD-10-CM | POA: Diagnosis not present

## 2018-09-11 DIAGNOSIS — F418 Other specified anxiety disorders: Secondary | ICD-10-CM

## 2018-09-11 DIAGNOSIS — Z9013 Acquired absence of bilateral breasts and nipples: Secondary | ICD-10-CM | POA: Insufficient documentation

## 2018-09-11 DIAGNOSIS — Z923 Personal history of irradiation: Secondary | ICD-10-CM

## 2018-09-11 DIAGNOSIS — I10 Essential (primary) hypertension: Secondary | ICD-10-CM | POA: Insufficient documentation

## 2018-09-11 DIAGNOSIS — Z79899 Other long term (current) drug therapy: Secondary | ICD-10-CM | POA: Diagnosis not present

## 2018-09-11 DIAGNOSIS — Z171 Estrogen receptor negative status [ER-]: Secondary | ICD-10-CM | POA: Diagnosis not present

## 2018-09-11 DIAGNOSIS — C4911 Malignant neoplasm of connective and soft tissue of right upper limb, including shoulder: Secondary | ICD-10-CM

## 2018-09-11 DIAGNOSIS — G893 Neoplasm related pain (acute) (chronic): Secondary | ICD-10-CM | POA: Insufficient documentation

## 2018-09-11 DIAGNOSIS — G629 Polyneuropathy, unspecified: Secondary | ICD-10-CM | POA: Insufficient documentation

## 2018-09-11 DIAGNOSIS — Z95828 Presence of other vascular implants and grafts: Secondary | ICD-10-CM

## 2018-09-11 LAB — COMPREHENSIVE METABOLIC PANEL
ALT: 11 U/L (ref 0–44)
AST: 23 U/L (ref 15–41)
Albumin: 3.8 g/dL (ref 3.5–5.0)
Alkaline Phosphatase: 128 U/L — ABNORMAL HIGH (ref 38–126)
Anion gap: 10 (ref 5–15)
BUN: 12 mg/dL (ref 8–23)
CO2: 30 mmol/L (ref 22–32)
Calcium: 9.7 mg/dL (ref 8.9–10.3)
Chloride: 101 mmol/L (ref 98–111)
Creatinine, Ser: 1.1 mg/dL — ABNORMAL HIGH (ref 0.44–1.00)
GFR calc Af Amer: >60 mL/min (ref >60)
GFR calc non Af Amer: 52 mL/min — ABNORMAL LOW (ref >60)
Glucose, Bld: 104 mg/dL — ABNORMAL HIGH (ref 70–99)
Potassium: 3.6 mmol/L (ref 3.5–5.1)
Sodium: 141 mmol/L (ref 135–145)
Total Bilirubin: 0.3 mg/dL (ref 0.3–1.2)
Total Protein: 8.1 g/dL (ref 6.5–8.1)

## 2018-09-11 LAB — CBC WITH DIFFERENTIAL/PLATELET
Abs Immature Granulocytes: 0.03 10*3/uL (ref 0.00–0.07)
Basophils Absolute: 0.1 10*3/uL (ref 0.0–0.1)
Basophils Relative: 1 %
Eosinophils Absolute: 0.2 10*3/uL (ref 0.0–0.5)
Eosinophils Relative: 3 %
HCT: 38.5 % (ref 36.0–46.0)
Hemoglobin: 12.2 g/dL (ref 12.0–15.0)
Immature Granulocytes: 0 %
Lymphocytes Relative: 30 %
Lymphs Abs: 2.2 10*3/uL (ref 0.7–4.0)
MCH: 26.9 pg (ref 26.0–34.0)
MCHC: 31.7 g/dL (ref 30.0–36.0)
MCV: 84.8 fL (ref 80.0–100.0)
Monocytes Absolute: 0.9 10*3/uL (ref 0.1–1.0)
Monocytes Relative: 12 %
Neutro Abs: 4 10*3/uL (ref 1.7–7.7)
Neutrophils Relative %: 54 %
Platelets: 256 10*3/uL (ref 150–400)
RBC: 4.54 MIL/uL (ref 3.87–5.11)
RDW: 15.5 % (ref 11.5–15.5)
WBC: 7.3 10*3/uL (ref 4.0–10.5)
nRBC: 0 % (ref 0.0–0.2)

## 2018-09-11 MED ORDER — SODIUM CHLORIDE 0.9 % IV SOLN
420.0000 mg | Freq: Once | INTRAVENOUS | Status: AC
  Administered 2018-09-11: 420 mg via INTRAVENOUS
  Filled 2018-09-11: qty 14

## 2018-09-11 MED ORDER — HEPARIN SOD (PORK) LOCK FLUSH 100 UNIT/ML IV SOLN
500.0000 [IU] | Freq: Once | INTRAVENOUS | Status: AC | PRN
  Administered 2018-09-11: 500 [IU]
  Filled 2018-09-11: qty 5

## 2018-09-11 MED ORDER — SODIUM CHLORIDE 0.9% FLUSH
10.0000 mL | INTRAVENOUS | Status: DC | PRN
  Administered 2018-09-11: 10 mL
  Filled 2018-09-11: qty 10

## 2018-09-11 MED ORDER — DIPHENHYDRAMINE HCL 25 MG PO CAPS
25.0000 mg | ORAL_CAPSULE | Freq: Once | ORAL | Status: AC
  Administered 2018-09-11: 25 mg via ORAL

## 2018-09-11 MED ORDER — SODIUM CHLORIDE 0.9% FLUSH
10.0000 mL | Freq: Once | INTRAVENOUS | Status: AC
  Administered 2018-09-11: 10 mL
  Filled 2018-09-11: qty 10

## 2018-09-11 MED ORDER — SODIUM CHLORIDE 0.9% FLUSH
10.0000 mL | INTRAVENOUS | Status: DC | PRN
  Filled 2018-09-11: qty 10

## 2018-09-11 MED ORDER — ACETAMINOPHEN 325 MG PO TABS
650.0000 mg | ORAL_TABLET | Freq: Once | ORAL | Status: AC
  Administered 2018-09-11: 650 mg via ORAL

## 2018-09-11 MED ORDER — ALPRAZOLAM 0.5 MG PO TABS: 0.5000 mg | ORAL_TABLET | Freq: Two times a day (BID) | ORAL | 0 refills | Status: DC | PRN

## 2018-09-11 MED ORDER — PACLITAXEL PROTEIN-BOUND CHEMO INJECTION 100 MG
100.0000 mg/m2 | Freq: Once | INTRAVENOUS | Status: AC
  Administered 2018-09-11: 200 mg via INTRAVENOUS
  Filled 2018-09-11: qty 40

## 2018-09-11 MED ORDER — DIPHENHYDRAMINE HCL 25 MG PO CAPS
ORAL_CAPSULE | ORAL | Status: AC
  Filled 2018-09-11: qty 1

## 2018-09-11 MED ORDER — PACLITAXEL PROTEIN-BOUND CHEMO INJECTION 100 MG
100.0000 mg/m2 | Freq: Once | Status: DC
  Filled 2018-09-11: qty 40

## 2018-09-11 MED ORDER — HEPARIN SOD (PORK) LOCK FLUSH 100 UNIT/ML IV SOLN
500.0000 [IU] | Freq: Once | INTRAVENOUS | Status: DC | PRN
  Filled 2018-09-11: qty 5

## 2018-09-11 MED ORDER — TRASTUZUMAB CHEMO 150 MG IV SOLR
6.0000 mg/kg | Freq: Once | INTRAVENOUS | Status: AC
  Administered 2018-09-11: 525 mg via INTRAVENOUS
  Filled 2018-09-11: qty 25

## 2018-09-11 MED ORDER — ACETAMINOPHEN 325 MG PO TABS
ORAL_TABLET | ORAL | Status: AC
  Filled 2018-09-11: qty 2

## 2018-09-11 MED ORDER — SODIUM CHLORIDE 0.9 % IV SOLN
Freq: Once | INTRAVENOUS | Status: AC
  Administered 2018-09-11: 11:00:00 via INTRAVENOUS
  Filled 2018-09-11: qty 250

## 2018-09-11 MED ORDER — PROCHLORPERAZINE MALEATE 10 MG PO TABS
ORAL_TABLET | ORAL | Status: AC
  Filled 2018-09-11: qty 1

## 2018-09-11 MED ORDER — PROCHLORPERAZINE MALEATE 10 MG PO TABS
10.0000 mg | ORAL_TABLET | Freq: Once | ORAL | Status: AC
  Administered 2018-09-11: 10 mg via ORAL

## 2018-09-11 NOTE — Progress Notes (Signed)
[name]  [phone] [phone]     ID: [name]   DOB: [birth year]  MR#: [id number]  PJK#:[id number]  Patient Care Team: Tawny Asal as PCP - General (Physician Assistant) , [name], MD as Consulting Physician (Oncology) [name], MD as Consulting Physician (Radiation Oncology) [name], [name], MD as Consulting Physician (Cardiology) [name], [name] 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 [name] has been receiving gemcitabine (switched from abraxane after cycle 11), trastuzumab and pertuzumab every 28 days.   However she has complained bitterly about side effects from gemcitabine.  The reason we had switched was because of neuropathy issues but she says she currently has no neuropathy and that what she was really trying to do was trying to get [name] to up her Neurontin dose.  She says what she said was neuropathy was really "paralysis" and that has been the same for many years.  She is here alone at the infusion room.   REVIEW OF SYSTEMS:  She takes care of her grandson at home. She also goes to the store and tries to walk and stay active. She states that she feels anxious and is scared to be around people. She reports insomnia. She reports back stiffness. She also has generalized itching. She is wearing a loose compression sleeve on her right arm, and states that she has neuropathy in her hands. The tips of her fingers hurt and her nails are peeling and breaking. She denies neuropathy in her feet. She recently tripped and injured her knees.  She will leave in 2-3 weeks for a trip. She denies unusual  headaches, visual changes, nausea, vomiting, or dizziness. There has been no unusual cough, phlegm production, or pleurisy. There has been no change in bowel or bladder habits. She denies unexplained fatigue or unexplained weight loss, bleeding, rash, or fever. A detailed review of systems was otherwise stable.    BREAST CANCER HISTORY: From the original intake note:  The patient originally presented in May 2003 when she noticed a lump in the upper inner quadrant of her right breast in September 2003.  She sought attention and had a mammogram which showed an obvious carcinoma in the upper inner quadrant of the right breast, approximately 2 cm.  There were some enlarged lymph nodes in the axilla and an FNA done showed those consistent with malignant cells, most likely an invasive ductal carcinoma.  At that point she was unsure of what to do and was referred by Dr. [name] for a discussion of her treatment options.  By biopsy, it was ER/PR negative and HER-2 was 3+.  DNA index was 1.42.  We reiterated that it was most important to have her disease surgically addressed at that point and had recommended lumpectomy and axillary nodes to be addressed with which she followed through and on 04-03-02, Dr. [name] performed a right partial mastectomy and a right axillary lymph node dissection.  Final pathology revealed a 2.4 cm. high grade, Grade III invasive ductal carcinoma with an adjacent .8 cm. also high grade invasive ductal carcinoma which was felt to represent an intramammary metastasis rather than a second primary.  A smaller mass was just medial to the  larger mass.  The smaller mass was associated with high grade DCIS component.  There was no definite lymphovascular invasion identified.  However, one of fourteen axillary lymph nodes did contain a 1.5 cm. metastatic deposit.  Postoperatively she did very well.  We reiterated the fact that she did need adjuvant chemotherapy, however, she refused and decided  that she would pursue radiation.  She received radiation and completed that on 08-14-02. Her subsequent history is as detailed below.   PAST MEDICAL HISTORY: Past Medical History:  Diagnosis Date  . Breast cancer ([place])   . DVT (deep venous thrombosis) ([place]) 10/07/2011  . Fever 02/06/2018  . Hypertension   . Radiation 11/29/2005-12/29/2005   right supraclavicular area 4147 cGy  . Radiation 06/26/02-08/14/02   right breast 5040 cGy, tumor bed boosted to 1260 cGy  . Rheumatic fever     PAST SURGICAL HISTORY: Past Surgical History:  Procedure Laterality Date  . BREAST SURGERY    . MASTECTOMY     bilateral  . PORT A CATH REVISION      FAMILY HISTORY Family History  Problem Relation Age of Onset  . Pancreatic cancer Mother   . Kidney cancer Sister 11  . Breast cancer Sister 70  . Breast cancer Other 51  The patient's father died in his 67M  from complications of alcohol abuse. The patient's mother died in her 63s from pancreatic cancer. The patient has 6 brothers, 2 sisters. Both her sisters have had breast cancer, both diagnosed after the age of 79. The patient has not had genetic testing so far.   GYNECOLOGIC HISTORY: Menarche age 29; first live birth age 81. She is GXP4. She is not sure when she stopped having periods. She never used hormone replacement.  SOCIAL HISTORY: The patient has worked in the past as a Psychologist, counselling. At home in addition to the patient are her husband [name], originally from [place], who works as a Administrator.  Also living in the home is her daughter [name] (she is studying to be a Marine scientist).  The other 2 children are [name], who has a college degree in psychology and works as a Probation officer; and [name], who lives in [place] and works as a Higher education careers adviser. [name] also sometimes stays in the home. In addition the patient has an aide who helps her almost daily.   ADVANCED DIRECTIVES: Not in place  HEALTH MAINTENANCE: Social History   Tobacco Use  .  Smoking status: Light Tobacco Smoker  . Smokeless tobacco: Never Used  Substance Use Topics  . Alcohol use: Yes    Comment: Rarely  . Drug use: No     Colonoscopy:  Not on file  PAP:  Not on file  Bone density:  Not on file  Lipid panel:  Not on file    Allergies  Allergen Reactions  . Pork-Derived Products Other (See Comments)    Pt says she just doesn't eat pork; has no reaction to pork products  . Tramadol Nausea Only  . Hydrocodone-Acetaminophen Itching and Rash    Current Outpatient Medications  Medication Sig Dispense Refill  . ALPRAZolam (XANAX) 1 MG tablet Take 1 tablet (1 mg total) by mouth 2 (two) times daily. 60 tablet 0  . Ascorbic Acid (VITAMIN C) 100 MG tablet Take 100 mg by mouth daily.      . cholecalciferol (VITAMIN D) 1000 units tablet Take 1 tablet (1,000 Units total) by mouth daily. 90 tablet 4  . gabapentin (NEURONTIN) 300 MG capsule Take  1 capsule (300 mg total) by mouth 2 (two) times daily as needed. 60 capsule 1  . hydrOXYzine (ATARAX/VISTARIL) 25 MG tablet TAKE 1 TABLET BY MOUTH THREE TIMES A DAY AS NEEDED 270 tablet 1  . methadone (DOLOPHINE) 10 MG tablet Take 10 mg by mouth 5 (five) times daily.  0  . Multiple Vitamin (MULTIVITAMIN) capsule Take 1 capsule by mouth daily.      Marland Kitchen oxyCODONE (ROXICODONE) 15 MG immediate release tablet Take 15 mg by mouth 3 (three) times daily.  0  . Oxycodone HCl 10 MG TABS Take 10 mg by mouth 3 (three) times daily.  0  . potassium chloride (MICRO-K) 10 MEQ CR capsule TAKE 2 (TWO) CAPSULES BY MOUTH ONCE DAILY 60 capsule 2  . prochlorperazine (COMPAZINE) 10 MG tablet Take 1 tablet (10 mg total) by mouth every 6 (six) hours as needed for nausea or vomiting. 30 tablet 0  . triamterene-hydrochlorothiazide (DYAZIDE) 37.5-25 MG capsule Take 1 each (1 capsule total) by mouth every morning. 30 capsule 3   No current facility-administered medications for this visit.    Facility-Administered Medications Ordered in Other Visits    Medication Dose Route Frequency Provider Last Rate Last Dose  . sodium chloride 0.9 % injection 10 mL  10 mL Intravenous PRN , [name], MD   10 mL at 03/04/14 1421    OBJECTIVE: Middle-aged African-American woman examined in the treatment recliner  For labs today please consult the infusion room follow-up sheet  There were no vitals filed for this visit.   There is no height or weight on file to calculate BMI.      ECOG FS: 1  Sclerae unicteric, EOMs intact Lungs no rales or rhonchi Heart regular rate and rhythm Abd soft, nontender, positive bowel sounds MSK no focal spinal tenderness, right upper extremity lymphedema, sleeve in place Neuro: nonfocal, well oriented, appropriate affect Breasts: Deferred    Photo 07/04/2017     Photo 08/23/2018          LAB RESULTS:.  Lab Results  Component Value Date   WBC 7.5 08/23/2018   NEUTROABS 4.5 08/23/2018   HGB 11.5 (L) 08/23/2018   HCT 36.0 08/23/2018   MCV 84.5 08/23/2018   PLT 184 08/23/2018      Chemistry      Component Value Date/Time   NA 142 08/14/2018 1236   NA 142 11/09/2017 1214   K 3.8 08/14/2018 1236   K 3.9 11/09/2017 1214   CL 101 08/14/2018 1236   CL 101 03/07/2013 1411   CO2 32 08/14/2018 1236   CO2 32 (H) 11/09/2017 1214   BUN 8 08/14/2018 1236   BUN 19.1 11/09/2017 1214   CREATININE 0.90 08/14/2018 1236   CREATININE 1.04 (H) 05/24/2018 0848   CREATININE 1.0 11/09/2017 1214      Component Value Date/Time   CALCIUM 9.8 08/14/2018 1236   CALCIUM 9.1 11/09/2017 1214   ALKPHOS 123 08/14/2018 1236   ALKPHOS 120 11/09/2017 1214   AST 21 08/14/2018 1236   AST 17 05/24/2018 0848   AST 30 11/09/2017 1214   ALT 10 08/14/2018 1236   ALT 11 05/24/2018 0848   ALT 19 11/09/2017 1214   BILITOT 0.4 08/14/2018 1236   BILITOT 0.4 05/24/2018 0848   BILITOT <0.22 11/09/2017 1214       STUDIES: echocardiogram 08/20/2018 shows an ejection fraction in the 55-60% range  ASSESSMENT:  62  y.o. [place] woman with stage IV breast cancer manifested chiefly by loco-regional  nodal disease (neck, chest) and skin involvement, without liver, bone, or brain metastases documented   (1) Status post right upper inner quadrant lumpectomy and sentinel lymph node sampling 03/04/2002 for 2 separate foci of invasive ductal carcinoma, mpT2 pN1 or stage IIB, both foci grade 3, both estrogen and progesterone receptor positive, both HercepTest 3+, Mib-1 56%  (2) Reexcision for margins 05/27/2002 showed no residual cancer in the breast.  (3) The patient refused adjuvant systemic therapy.  (4) Adjuvant radiation treatment completed 08/14/2002.  (5) recurrence in the right breast in 02/2004 showing a morphologically different tumor, again grade 3, again estrogen and progesterone receptor negative, with an MIB-1 of 14% and Herceptest 3+.  (5) Between 03/2004 and 07/2004 she received dose dense Doxorubicin/Cyclophosphamide x 4 given with trastuzumab, followed by weekly Docetaxel x 8, again given with trastuzumab.  (6) Right mastectomy 07/13/2004 showed scattered microscopic foci of residual disease over an area  greater than 5 cm. Margins were negative.  (7) Postoperative Docetaxel continued until 09/2004.  (8) Trastuzumab (Herceptin) given 08/2004 through 01/2012 with some brief interruptions.  RECURRENT/ STAGE IV DISEASE December 2006 (9) Isolated right cervical nodal recurrence 10/2005, treated with radiation to the right supraclavicular area (total 41.5 gray) completed 12/29/2005.  (10) Navelbine given together with Herceptin  11/2005 through 03/2006.  (9) Left mastectomy 02/13/2006 for ductal carcinoma in situ, grade 2, estrogen and progesterone receptor negative, with negative margins; 0 of 3 lymph nodes involved  (10) treated with Lapatinib and Capecitabine before 10/2009, for an unclear duration and with unclear results (cannot locate data on chart review).  (11) Status post right  supraclavicular lymph node biopsy 09/2010 again positive for an invasive ductal carcinoma, estrogen and progesterone receptor negative, HER-2 positive by CISH with a ratio 4.25.  (12) Navelbine given together with Herceptin between 05/2011 and 11/2011.  (13) Carboplatin/ Gemcitabine/ Herceptin given for 2 cycles, in 12/2011 and 01/2012.  (14) TDM-1 ([place]) started 02/2012. Last dose 10/02/2013 after which the patient discontinued treatment at her own discretion. Echo on December 2014 showed a well preserved ejection fraction.  (15) Deep vein thrombosis of the right upper extremity documented 04/20/2011.  She completed anticoagulant therapy with Coumadin on 03/25/2013.  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 Left chest Port-A-Cath placed on 04/08/2013; being flushed every 6 weeks  RIGHT UPPER EXTREMITY ANGIOSARCOMA VS BREAST CANCER: August 2016 (18) treated at [facility] of [place] August 2016 with paclitaxel, trastuzumab and pertuzumab 1 cycle, afterwards referred to hospice  (19) under the care of hospice of [facility] August 2016 to 05/08/2016, when the patient opted for a second try at chemotherapy  (20) started low-dose Abraxane, trastuzumab and pertuzumab 06/07/2016, to be repeated every 3 weeks.   (a) pretreatment echocardiogram 06/06/2016 showed a 60-65% ejection fraction  (b) significant compliance problems compromise response to treatment  (c)  echocardiogram 02/20/17 LVEF 55-60%  (d) Abraxane discontinued 07/04/2017 because of possible neuropathy symptoms  (e) Abraxane resumed 09/27/2017  (f) echocardiogram 07/20/2017 showed an ejection fraction of 60-65%  (g) echo 01/22/2018 showed an ejection fraction of 50-55%  (h) CT scan of the  chest 05/03/2018 shows no disease involving the lungs liver or lymph nodes, with stable subcutaneous masses as previously noted  (I) Abraxane discontinued August 2019 because of concerns regarding neuropathy-- gemcitabine substituted  (  j) echocardiogram 08/20/2018 shows an ejection fraction in the 55-60% range  (k) gemcitabine discontinued because of multiple symptoms, Abraxane restated 09/11/2018  (21) Chronic pain secondary to known metastatic disease  (a) patient signed pain contract 10/19/2016  (b) as of 11/16/2016 receives her pain medications from Dr [name]  (c)  Dr [name] no longer able to prescribe as of February 2019  (d) all pain medications now through [facility] (as of April 2019)  I, Lurline Del MD, have reviewed the above documentation for accuracy and completeness, and I agree with the above. PLAN:  Larita is now 13 years out from definitive diagnosis of metastatic breast cancer, and little over 3 years out from her diagnosis of angiosarcoma.  She looks remarkably well.  She complained bitterly about gemcitabine, and we are discontinuing that drug.  Of course of concern we have with Abraxane is neuropathy.  She does have some discomfort or pain in the very tips of her fingers, not her toes.  This has not progressed.  She says she has "neuropathy" in her right arm, but that really is loss of function and pain secondary to the cancer there.  While I think we are taking a chance, nevertheless I do not have a better treatment for Laynie and certainly I think if we stopped treating this or, it would progress and cause her even more disability  Accordingly plan is to resume the Abraxane, which we are doing today, but observe her closely and if there is significant progression of the neuropathy then stop and consider discontinuation of chemotherapy.  She has a trip planned from [place], which will take her on a cruise around Thanksgiving's time.  I warned  her regarding excessive sun exposure  Otherwise I will see her again with her next treatment.  She knows to call for any other issues that may develop before then. , [name], MD  09/11/18 8:39 AM Medical Oncology and Hematology [facility] [address] [place], [place] [address] Tel. [phone]    Fax. [phone]  Lendon Ka, am acting as scribe for Chauncey Cruel MD.  I, Lurline Del MD, have reviewed the above documentation for accuracy and completeness, and I agree with the above.

## 2018-09-11 NOTE — Telephone Encounter (Signed)
This RN spoke with pt in the lobby per her call yesterday regarding treatment plan.  Pt states she does not want Gemzar due to side effects.  She would like to get only herceptin and perjeta today due to planned family trip.  Plan then is to resume the abraxane with the herceptin/perjeta with future scheduled chemo treatments.  Above reviewed with MD who is in agreement to request.

## 2018-09-11 NOTE — Patient Instructions (Signed)
[facility] Discharge Instructions for Patients Receiving Chemotherapy  Today you received the following chemotherapy agents Abraxane,Herceptin,Perjeta  To help prevent nausea and vomiting after your treatment, we encourage you to take your nausea medication as directed  If you develop nausea and vomiting that is not controlled by your nausea medication, call the clinic.   BELOW ARE SYMPTOMS THAT SHOULD BE REPORTED IMMEDIATELY:  *FEVER GREATER THAN 100.5 F  *CHILLS WITH OR WITHOUT FEVER  NAUSEA AND VOMITING THAT IS NOT CONTROLLED WITH YOUR NAUSEA MEDICATION  *UNUSUAL SHORTNESS OF BREATH  *UNUSUAL BRUISING OR BLEEDING  TENDERNESS IN MOUTH AND THROAT WITH OR WITHOUT PRESENCE OF ULCERS  *URINARY PROBLEMS  *BOWEL PROBLEMS  UNUSUAL RASH Items with * indicate a potential emergency and should be followed up as soon as possible.  Feel free to call the clinic should you have any questions or concerns. The clinic phone number is [phone].  Please show the CHEMO ALERT CARD at check-in to the Emergency Department and triage nurse.   

## 2018-09-11 NOTE — Telephone Encounter (Signed)
No 11/5 los orders.

## 2018-09-13 ENCOUNTER — Ambulatory Visit (INDEPENDENT_AMBULATORY_CARE_PROVIDER_SITE_OTHER): Payer: Commercial Managed Care - PPO | Admitting: Physician Assistant

## 2018-09-16 IMAGING — CT CT CHEST W/ CM
2 of 3 series · 14 of 36 positions shown, 17 images · IV contrast (omnipaque)
Comparison: CT scan 01/21/2017

CLINICAL DATA: History of primary angiosarcoma of the right upper
extremity.

History of right breast cancer.  Ongoing chemotherapy.
EXAM:
CT CHEST WITH CONTRAST
TECHNIQUE: Multidetector CT imaging of the chest was performed during
intravenous contrast administration.
CONTRAST:  75mL OMNIPAQUE IOHEXOL 300 MG/ML  SOLN

[Series 2: axial st · axial · 0.64mm/px · z∈[-290,-30]mm · 11 of 154 slices shown, 14 images]
[im 12/154  mediastinal]
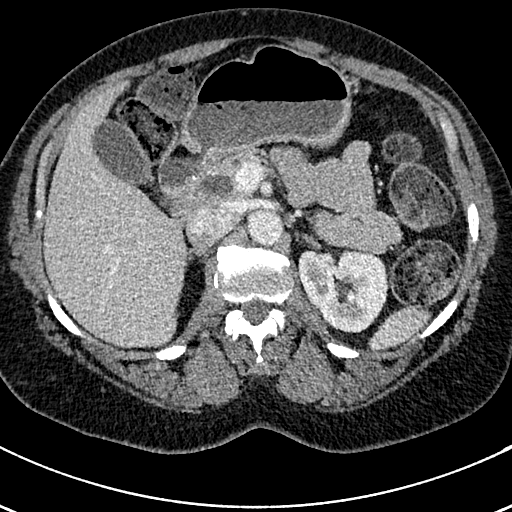
[im 12/154  lung]
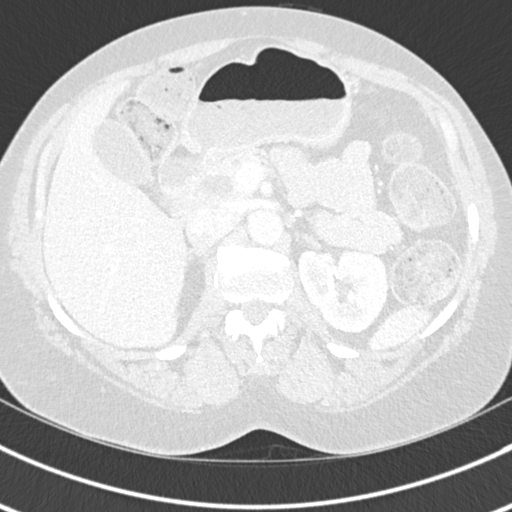
[im 23/154  lung]
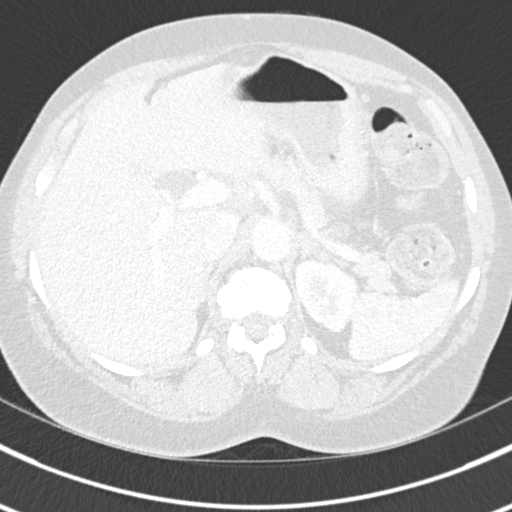
[im 35/154  lung]
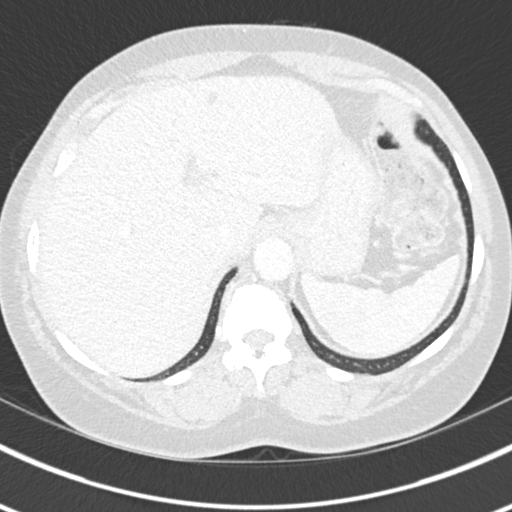
[im 52/154  lung]
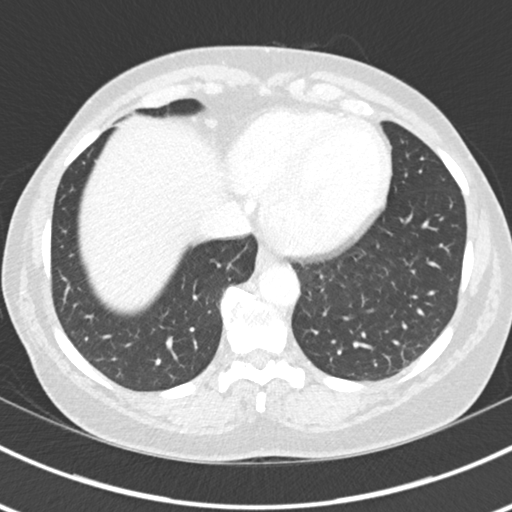
[im 63/154  mediastinal]
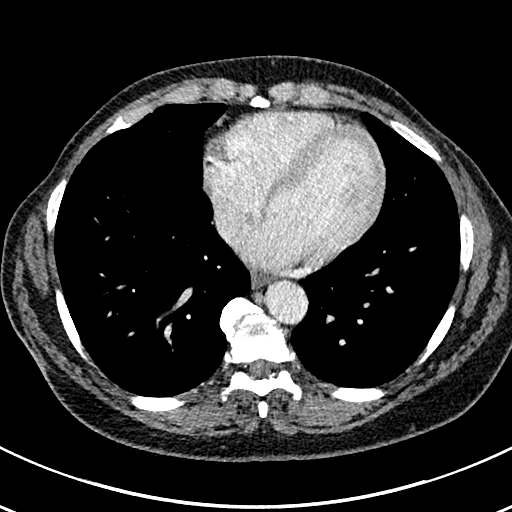
[im 63/154  lung]
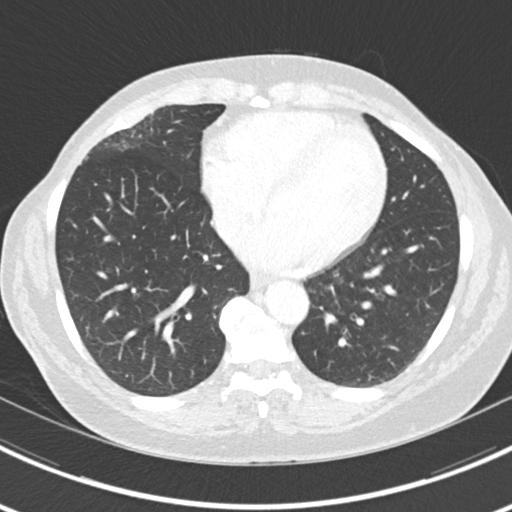
[im 80/154  lung]
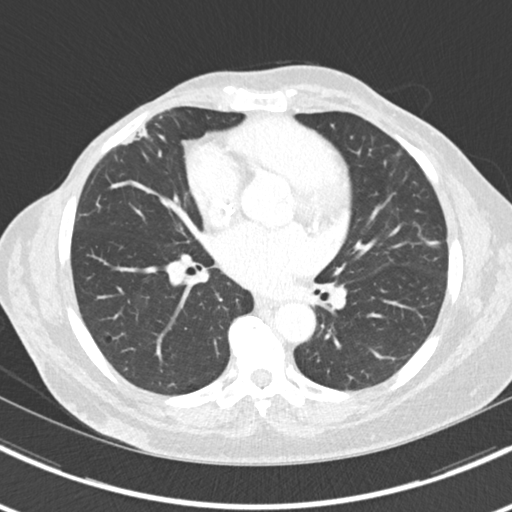
[im 91/154  lung]
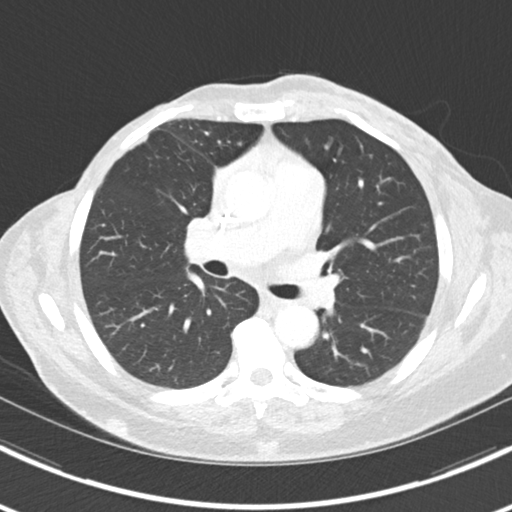
[im 103/154  lung]
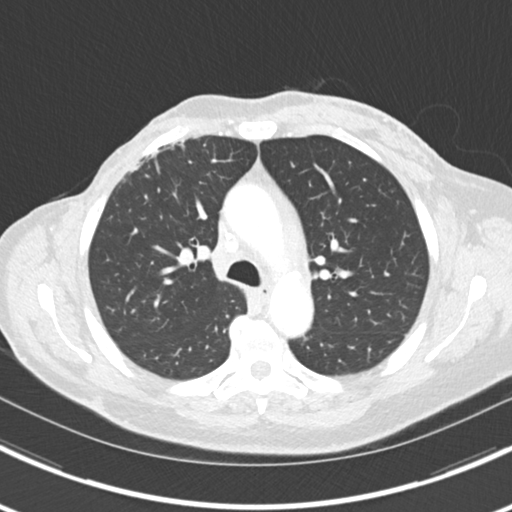
[im 120/154  mediastinal]
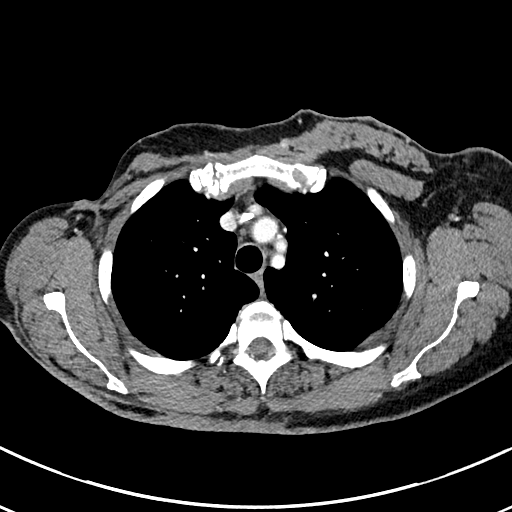
[im 120/154  lung]
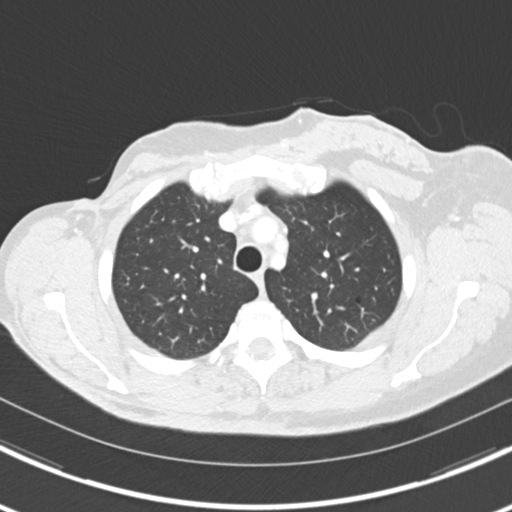
[im 131/154  lung]
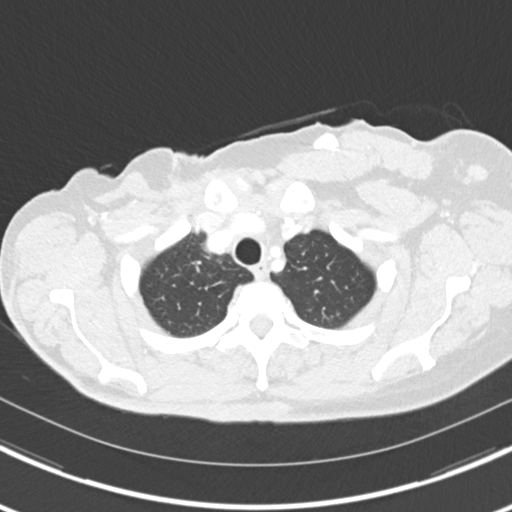
[im 142/154  lung]
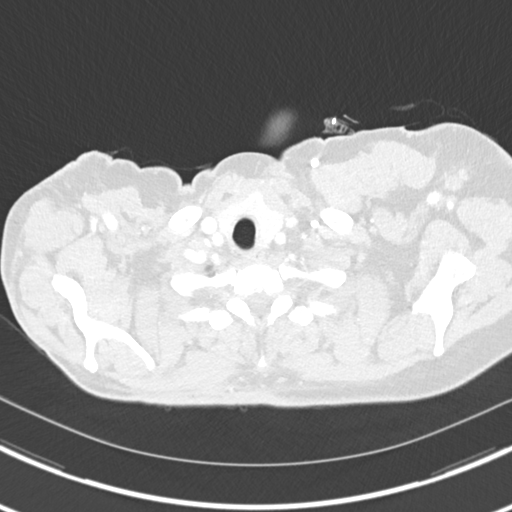

[Series 5: coronal · coronal · 0.54mm/px · 3 of 134 slices shown]
[im 27/134  lung]
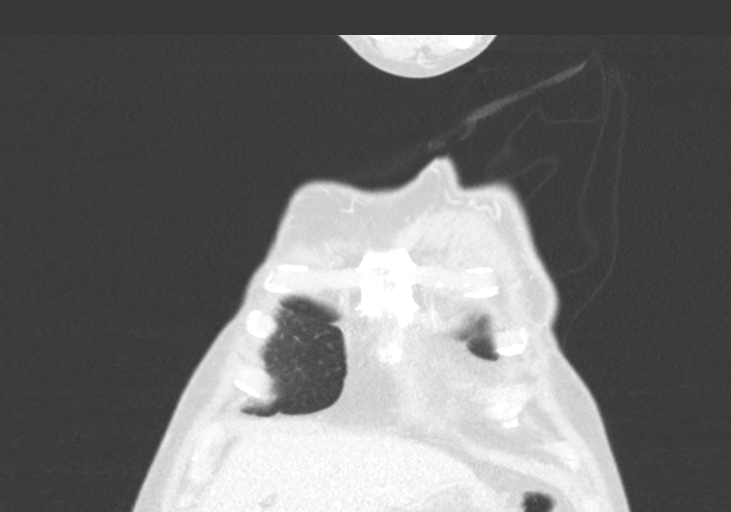
[im 54/134  lung]
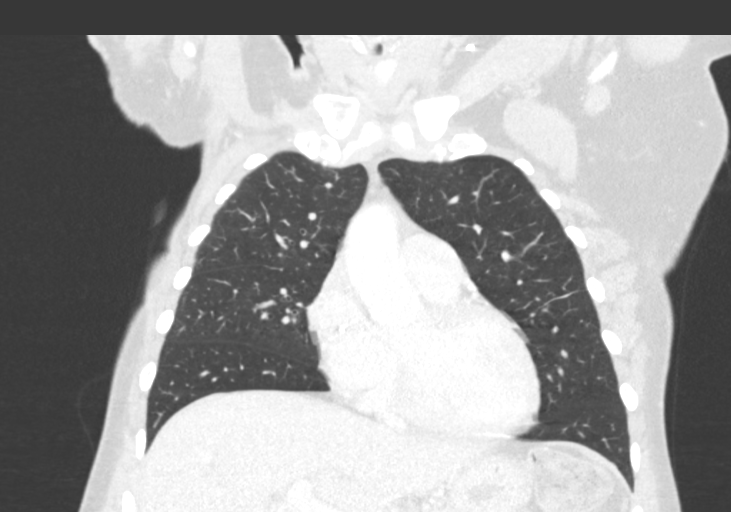
[im 80/134  lung]
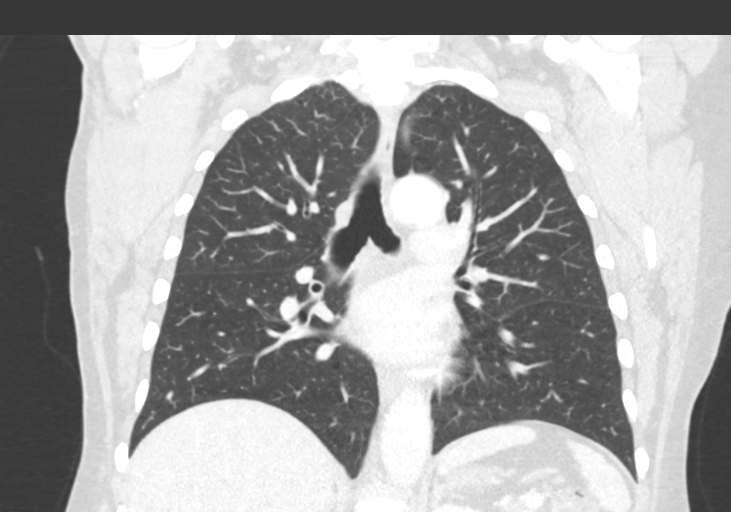

[14 of 36 positions shown; findings below may reference images not displayed]

FINDINGS: Cardiovascular: The heart is normal in size. No pericardial
effusion. Scattered atherosclerotic calcifications involving the
thoracic aorta but no aneurysm or dissection. The branch vessels are
patent. Scattered coronary artery calcifications.

Mediastinum/Nodes: No mediastinal or hilar mass or lymphadenopathy.
The esophagus is grossly normal.

Lungs/Pleura: Stable emphysematous changes and areas of pulmonary
scarring. Stable radiation changes involving the anterior aspect of
the right lung.

No new or worrisome pulmonary lesions.  No acute pulmonary findings.

Upper Abdomen: Fairly significant common bile duct dilatation
without obvious cause. The common bile duct is dilated up to 15 mm.
No obvious cause is demonstrated on this study. There is also mild
intrahepatic biliary dilatation. No dilatation of the main
pancreatic duct. Recommend correlation with liver function studies.
Patient may need ERCP or MRCP for further evaluation.

Musculoskeletal: No significant bony findings.

Nodularity in the subcutaneous tissues of the upper right back with
more focal nodular lesions which appears stable.
IMPRESSION: 1. No findings for metastatic disease involving the chest. No
mediastinal/hilar lymphadenopathy or metastatic pulmonary nodules.
2. Stable surgical changes from a right mastectomy. No findings to
suggest chest wall recurrence or metastatic disease.
3. Subcutaneous nodularity involving the upper right posterior
chest. This appears fairly stable. No new/progressive findings.
Recommend clinical correlation.
4. No significant upper abdominal findings.

## 2018-09-21 ENCOUNTER — Telehealth: Payer: Self-pay

## 2018-09-21 MED ORDER — AZITHROMYCIN 250 MG PO TABS: ORAL_TABLET | ORAL | 0 refills | Status: DC

## 2018-09-21 NOTE — Telephone Encounter (Signed)
Returned patient's call.  Patient c/o productive cough with green mucous.  Patient denies any fever, or facial pain.  Patient reports symptoms X 1 week.    Per Dr. Jana Hakim okay for Z-pak to be sent to pharmacy.   Left message with patient informing that medication has been sent.

## 2018-09-27 ENCOUNTER — Other Ambulatory Visit: Payer: Self-pay | Admitting: Oncology

## 2018-10-09 ENCOUNTER — Other Ambulatory Visit: Payer: Commercial Managed Care - PPO

## 2018-10-09 ENCOUNTER — Ambulatory Visit: Payer: Commercial Managed Care - PPO | Admitting: Oncology

## 2018-10-09 ENCOUNTER — Ambulatory Visit: Payer: Commercial Managed Care - PPO

## 2018-10-22 ENCOUNTER — Ambulatory Visit: Payer: Medicare Other | Admitting: Family Medicine

## 2018-10-22 NOTE — Progress Notes (Signed)
[place]  [phone] [phone]     ID: [name]   DOB: 1956/03/18  MR#: [id number]  SWH#:[id number]  Patient Care Team: Tawny Asal as PCP - General (Physician Assistant) Ardice Boyan, [name], MD as Consulting Physician (Oncology) [name], MD as Consulting Physician (Radiation Oncology) [name], [name], MD as Consulting Physician (Cardiology) [name], [name] 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   The patient is now back on paclitaxel which she is tolerating well.    The patient also continues on trastuzumab and pertuzumab, which she is tolerating well.    REVIEW OF SYSTEMS: [name] was doing great until she came back home from her cruise to [place]. She currently does not have a primary care physician because she has not been satisfied with the care she has received by ones she's seen. She is experiencing some difficulty with her memory today when trying to recall information. She also states that she has not been feeling well. The patient denies unusual headaches, visual changes, nausea, vomiting, or dizziness. There has been no unusual cough, phlegm production, or pleurisy. This been no change in bowel or bladder habits. The patient denies unexplained fatigue or unexplained weight loss, bleeding, rash, or fever. A detailed review of systems was otherwise noncontributory.    BREAST CANCER HISTORY: From the original intake note:  The patient originally presented in May 2003 when she noticed a lump in the upper inner quadrant of her right breast in September 2003.  She sought attention and had a mammogram  which showed an obvious carcinoma in the upper inner quadrant of the right breast, approximately 2 cm.  There were some enlarged lymph nodes in the axilla and an FNA done showed those consistent with malignant cells, most likely an invasive ductal carcinoma.  At that point she was unsure of what to do and was referred by Dr. [name] for a discussion of her treatment options.  By biopsy, it was ER/PR negative and HER-2 was 3+.  DNA index was 1.42.  We reiterated that it was most important to have her disease surgically addressed at that point and had recommended lumpectomy and axillary nodes to be addressed with which she followed through and on 04-03-02, Dr. [name] performed a right partial mastectomy and a right axillary lymph node dissection.  Final pathology revealed a 2.4 cm. high grade, Grade III invasive ductal carcinoma with an adjacent .8 cm. also high grade invasive ductal carcinoma which was felt to represent an intramammary metastasis rather than a second primary.  A smaller mass was just medial to the larger mass.  The smaller mass was associated with high grade DCIS component.  There was no definite lymphovascular invasion identified.  However, one of fourteen axillary lymph nodes did contain a 1.5 cm. metastatic deposit.  Postoperatively she did very well.  We reiterated the fact that she did need adjuvant chemotherapy, however, she refused and decided that she would pursue radiation.  She received radiation and completed that on 08-14-02. Her subsequent history is as detailed below.   PAST MEDICAL HISTORY: Past Medical History:  Diagnosis Date  . Breast cancer ([facility])   . DVT (deep venous thrombosis) ([facility]) 10/07/2011  .  Fever 02/06/2018  . Hypertension   . Radiation 11/29/2005-12/29/2005   right supraclavicular area 4147 cGy  . Radiation 06/26/02-08/14/02   right breast 5040 cGy, tumor bed boosted to 1260 cGy  . Rheumatic fever     PAST SURGICAL HISTORY: Past Surgical History:    Procedure Laterality Date  . BREAST SURGERY    . MASTECTOMY     bilateral  . PORT A CATH REVISION      FAMILY HISTORY Family History  Problem Relation Age of Onset  . Pancreatic cancer Mother   . Kidney cancer Sister 33  . Breast cancer Sister 87  . Breast cancer Other 60  The patient's father died in his 28Z  from complications of alcohol abuse. The patient's mother died in her 55s from pancreatic cancer. The patient has 6 brothers, 2 sisters. Both her sisters have had breast cancer, both diagnosed after the age of 104. The patient has not had genetic testing so far.   GYNECOLOGIC HISTORY: Menarche age 14; first live birth age 4. She is GXP4. She is not sure when she stopped having periods. She never used hormone replacement.  SOCIAL HISTORY: The patient has worked in the past as a Psychologist, counselling. At home in addition to the patient are her husband [name], originally from [place], who works as a Administrator.  Also living in the home is her daughter [name] (she is studying to be a Marine scientist).  The other 2 children are [name], who has a college degree in psychology and works as a Probation officer; and [name], who lives in [place] and works as a Higher education careers adviser. [name] also sometimes stays in the home. In addition the patient has an aide who helps her almost daily.   ADVANCED DIRECTIVES: Not in place  HEALTH MAINTENANCE: Social History   Tobacco Use  . Smoking status: Light Tobacco Smoker  . Smokeless tobacco: Never Used  Substance Use Topics  . Alcohol use: Yes    Comment: Rarely  . Drug use: No     Colonoscopy:  Not on file  PAP:  Not on file  Bone density:  Not on file  Lipid panel:  Not on file    Allergies  Allergen Reactions  . Pork-Derived Products Other (See Comments)    Pt says she just doesn't eat pork; has no reaction to pork products  . Tramadol Nausea Only  . Hydrocodone-Acetaminophen Itching and Rash    Current Outpatient Medications  Medication Sig  Dispense Refill  . ALPRAZolam (XANAX) 0.5 MG tablet Take 1 tablet (0.5 mg total) by mouth 2 (two) times daily as needed for anxiety. 45 tablet 0  . Ascorbic Acid (VITAMIN C) 100 MG tablet Take 100 mg by mouth daily.      Marland Kitchen azithromycin (ZITHROMAX Z-PAK) 250 MG tablet Take two tablets PO 1st day - 1 Tablet daily after X 4 days 6 each 0  . cholecalciferol (VITAMIN D) 1000 units tablet Take 1 tablet (1,000 Units total) by mouth daily. 90 tablet 4  . gabapentin (NEURONTIN) 300 MG capsule Take 1 capsule (300 mg total) by mouth 2 (two) times daily as needed. 60 capsule 1  . hydrOXYzine (ATARAX/VISTARIL) 25 MG tablet TAKE 1 TABLET BY MOUTH THREE TIMES A DAY AS NEEDED 270 tablet 1  . methadone (DOLOPHINE) 10 MG tablet Take 10 mg by mouth 5 (five) times daily.  0  . Multiple Vitamin (MULTIVITAMIN) capsule Take 1 capsule by mouth daily.      Marland Kitchen oxyCODONE (ROXICODONE) 15  MG immediate release tablet Take 15 mg by mouth 3 (three) times daily.  0  . Oxycodone HCl 10 MG TABS Take 10 mg by mouth 3 (three) times daily.  0  . potassium chloride (MICRO-K) 10 MEQ CR capsule TAKE 2 (TWO) CAPSULES BY MOUTH ONCE DAILY 60 capsule 0  . prochlorperazine (COMPAZINE) 10 MG tablet Take 1 tablet (10 mg total) by mouth every 6 (six) hours as needed for nausea or vomiting. 30 tablet 0  . triamterene-hydrochlorothiazide (DYAZIDE) 37.5-25 MG capsule Take 1 each (1 capsule total) by mouth every morning. 30 capsule 3   No current facility-administered medications for this visit.    Facility-Administered Medications Ordered in Other Visits  Medication Dose Route Frequency Provider Last Rate Last Dose  . sodium chloride 0.9 % injection 10 mL  10 mL Intravenous PRN Ulyana Pitones, [name], MD   10 mL at 03/04/14 1421    OBJECTIVE: Middle-aged African-American [name] who appears stated age  62:   10/23/18 1331  BP: (!) 143/76  Pulse: 74  Resp: 18  Temp: 98.7 F (37.1 C)  SpO2: 100%     Body mass index is 32.25 kg/m.       ECOG FS: 1  Sclerae unicteric, EOMs intact No cervical or supraclavicular adenopathy Lungs no rales or rhonchi Heart regular rate and rhythm Abd soft, nontender, positive bowel sounds MSK no focal spinal tenderness, right upper extremity lymphedema is improved Neuro: nonfocal, well oriented, demanding affect Breasts: That is post bilateral mastectomies with skin and arm involvement around the back appearing somewhat improved    Photo 07/04/2017     Photo 08/23/2018          LAB RESULTS:.  Lab Results  Component Value Date   WBC 6.8 10/23/2018   NEUTROABS 4.3 10/23/2018   HGB 11.5 (L) 10/23/2018   HCT 36.8 10/23/2018   MCV 85.4 10/23/2018   PLT 264 10/23/2018      Chemistry      Component Value Date/Time   NA 139 10/23/2018 1318   NA 142 11/09/2017 1214   K 4.1 10/23/2018 1318   K 3.9 11/09/2017 1214   CL 100 10/23/2018 1318   CL 101 03/07/2013 1411   CO2 31 10/23/2018 1318   CO2 32 (H) 11/09/2017 1214   BUN 12 10/23/2018 1318   BUN 19.1 11/09/2017 1214   CREATININE 0.89 10/23/2018 1318   CREATININE 1.04 (H) 05/24/2018 0848   CREATININE 1.0 11/09/2017 1214      Component Value Date/Time   CALCIUM 9.3 10/23/2018 1318   CALCIUM 9.1 11/09/2017 1214   ALKPHOS 124 10/23/2018 1318   ALKPHOS 120 11/09/2017 1214   AST 24 10/23/2018 1318   AST 17 05/24/2018 0848   AST 30 11/09/2017 1214   ALT 13 10/23/2018 1318   ALT 11 05/24/2018 0848   ALT 19 11/09/2017 1214   BILITOT 0.2 (L) 10/23/2018 1318   BILITOT 0.4 05/24/2018 0848   BILITOT <0.22 11/09/2017 1214       STUDIES: No results found.   ASSESSMENT:  62 y.o. [name] with stage IV breast cancer manifested chiefly by loco-regional nodal disease (neck, chest) and skin involvement, without liver, bone, or brain metastases documented   (1) Status post right upper inner quadrant lumpectomy and sentinel lymph node sampling 03/04/2002 for 2 separate foci of invasive ductal carcinoma, mpT2 pN1  or stage IIB, both foci grade 3, both estrogen and progesterone receptor positive, both HercepTest 3+, Mib-1 56%  (2) Reexcision for  margins 05/27/2002 showed no residual cancer in the breast.  (3) The patient refused adjuvant systemic therapy.  (4) Adjuvant radiation treatment completed 08/14/2002.  (5) recurrence in the right breast in 02/2004 showing a morphologically different tumor, again grade 3, again estrogen and progesterone receptor negative, with an MIB-1 of 14% and Herceptest 3+.  (5) Between 03/2004 and 07/2004 she received dose dense Doxorubicin/Cyclophosphamide x 4 given with trastuzumab, followed by weekly Docetaxel x 8, again given with trastuzumab.  (6) Right mastectomy 07/13/2004 showed scattered microscopic foci of residual disease over an area  greater than 5 cm. Margins were negative.  (7) Postoperative Docetaxel continued until 09/2004.  (8) Trastuzumab (Herceptin) given 08/2004 through 01/2012 with some brief interruptions.  RECURRENT/ STAGE IV DISEASE December 2006 (9) Isolated right cervical nodal recurrence 10/2005, treated with radiation to the right supraclavicular area (total 41.5 gray) completed 12/29/2005.  (10) Navelbine given together with Herceptin  11/2005 through 03/2006.  (9) Left mastectomy 02/13/2006 for ductal carcinoma in situ, grade 2, estrogen and progesterone receptor negative, with negative margins; 0 of 3 lymph nodes involved  (10) treated with Lapatinib and Capecitabine before 10/2009, for an unclear duration and with unclear results (cannot locate data on chart review).  (11) Status post right supraclavicular lymph node biopsy 09/2010 again positive for an invasive ductal carcinoma, estrogen and progesterone receptor negative, HER-2 positive by CISH with a ratio 4.25.  (12) Navelbine given together with Herceptin between 05/2011 and 11/2011.  (13) Carboplatin/ Gemcitabine/ Herceptin given for 2 cycles, in 12/2011 and 01/2012.  (14)  TDM-1 ([place]) started 02/2012. Last dose 10/02/2013 after which the patient discontinued treatment at her own discretion. Echo on December 2014 showed a well preserved ejection fraction.  (15) Deep vein thrombosis of the right upper extremity documented 04/20/2011.  She completed anticoagulant therapy with Coumadin on 03/25/2013.  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 Left chest Port-A-Cath placed on 04/08/2013; being flushed every 6 weeks  RIGHT UPPER EXTREMITY ANGIOSARCOMA VS BREAST CANCER: August 2016 (18) treated at [facility] of [place] August 2016 with paclitaxel, trastuzumab and pertuzumab 1 cycle, afterwards referred to hospice  (19) under the care of hospice of [facility] August 2016 to 05/08/2016, when the patient opted for a second try at chemotherapy  (20) started low-dose Abraxane, trastuzumab and pertuzumab 06/07/2016, to be repeated every 3 weeks.   (a) pretreatment echocardiogram 06/06/2016 showed a 60-65% ejection fraction  (b) significant compliance problems compromise response to treatment  (c)  echocardiogram 02/20/17 LVEF 55-60%  (d) Abraxane discontinued 07/04/2017 because of possible neuropathy symptoms  (e) Abraxane resumed 09/27/2017  (f) echocardiogram 07/20/2017 showed an ejection fraction of 60-65%  (g) echo 01/22/2018 showed an ejection fraction of 50-55%  (h) CT scan of the chest 05/03/2018 shows no disease involving the lungs liver or lymph nodes, with stable subcutaneous masses as previously noted  (I) Abraxane discontinued August 2019 because of concerns regarding neuropathy-- gemcitabine substituted  (j) echocardiogram 08/20/2018 shows an ejection fraction in the 55-60% range  (k) gemcitabine discontinued  because of multiple symptoms, Abraxane restarted 09/11/2018  (21) Chronic pain secondary to known metastatic disease  (a) patient signed pain contract 10/19/2016  (b) as of 11/16/2016 receives her pain medications from Dr [name]  (c)  Dr [name] no longer able to prescribe as  of February 2019  (d) all pain medications now through [facility] clinic (as of April 2019)   PLAN: [name] is 13 years out from definitive diagnosis of metastatic breast cancer.  She is remarkably stable.  We are continuing treatment as before, with anti-HER-2 agents and Abraxane.    She was very demanding regarding alprazolam.  She receives early alprazolam from her pain clinic on 09/25/2018.  I did not gave her a refill today.  She still has a prescription that I wrote for the same on 09/11/2018, but she has not filled it because it was for 0.5 mg.  [name] had a wonderful time in [place] and is now ready to resume treatment.  We will continue as before every 28 days until there is clear evidence of disease progression or she is no longer able to tolerate therapy  She will see me again in 2 months  She knows to call for any other issue that may develop before that visit.   [name], [name], MD  10/23/18 2:12 PM Medical Oncology and Hematology [facility] [address] [place], [place] [address] Tel. [phone]    Fax. [phone]   I, Jacqualyn Posey am acting as a Education administrator for Chauncey Cruel, MD.   I, Lurline Del MD, have reviewed the above documentation for accuracy and completeness, and I agree with the above.

## 2018-10-23 ENCOUNTER — Inpatient Hospital Stay: Payer: Commercial Managed Care - PPO | Attending: Oncology

## 2018-10-23 ENCOUNTER — Inpatient Hospital Stay: Payer: Commercial Managed Care - PPO

## 2018-10-23 ENCOUNTER — Inpatient Hospital Stay (HOSPITAL_BASED_OUTPATIENT_CLINIC_OR_DEPARTMENT_OTHER): Payer: Commercial Managed Care - PPO | Admitting: Oncology

## 2018-10-23 VITALS — BP 143/76 | HR 74 | Temp 98.7°F | Resp 18 | Ht 65.0 in | Wt 193.8 lb

## 2018-10-23 DIAGNOSIS — Z5111 Encounter for antineoplastic chemotherapy: Secondary | ICD-10-CM | POA: Diagnosis not present

## 2018-10-23 DIAGNOSIS — Z95828 Presence of other vascular implants and grafts: Secondary | ICD-10-CM

## 2018-10-23 DIAGNOSIS — Z86718 Personal history of other venous thrombosis and embolism: Secondary | ICD-10-CM | POA: Insufficient documentation

## 2018-10-23 DIAGNOSIS — C792 Secondary malignant neoplasm of skin: Secondary | ICD-10-CM | POA: Diagnosis not present

## 2018-10-23 DIAGNOSIS — C4911 Malignant neoplasm of connective and soft tissue of right upper limb, including shoulder: Secondary | ICD-10-CM

## 2018-10-23 DIAGNOSIS — Z79899 Other long term (current) drug therapy: Secondary | ICD-10-CM | POA: Insufficient documentation

## 2018-10-23 DIAGNOSIS — F17210 Nicotine dependence, cigarettes, uncomplicated: Secondary | ICD-10-CM | POA: Insufficient documentation

## 2018-10-23 DIAGNOSIS — Z170 Estrogen receptor positive status [ER+]: Secondary | ICD-10-CM

## 2018-10-23 DIAGNOSIS — C770 Secondary and unspecified malignant neoplasm of lymph nodes of head, face and neck: Secondary | ICD-10-CM | POA: Diagnosis not present

## 2018-10-23 DIAGNOSIS — I10 Essential (primary) hypertension: Secondary | ICD-10-CM

## 2018-10-23 DIAGNOSIS — Z171 Estrogen receptor negative status [ER-]: Secondary | ICD-10-CM | POA: Insufficient documentation

## 2018-10-23 DIAGNOSIS — Z9011 Acquired absence of right breast and nipple: Secondary | ICD-10-CM | POA: Diagnosis not present

## 2018-10-23 DIAGNOSIS — G893 Neoplasm related pain (acute) (chronic): Secondary | ICD-10-CM | POA: Diagnosis not present

## 2018-10-23 DIAGNOSIS — C50211 Malignant neoplasm of upper-inner quadrant of right female breast: Secondary | ICD-10-CM

## 2018-10-23 DIAGNOSIS — Z5112 Encounter for antineoplastic immunotherapy: Secondary | ICD-10-CM | POA: Insufficient documentation

## 2018-10-23 DIAGNOSIS — Z923 Personal history of irradiation: Secondary | ICD-10-CM | POA: Insufficient documentation

## 2018-10-23 LAB — COMPREHENSIVE METABOLIC PANEL
ALT: 13 U/L (ref 0–44)
AST: 24 U/L (ref 15–41)
Albumin: 3.7 g/dL (ref 3.5–5.0)
Alkaline Phosphatase: 124 U/L (ref 38–126)
Anion gap: 8 (ref 5–15)
BUN: 12 mg/dL (ref 8–23)
CO2: 31 mmol/L (ref 22–32)
Calcium: 9.3 mg/dL (ref 8.9–10.3)
Chloride: 100 mmol/L (ref 98–111)
Creatinine, Ser: 0.89 mg/dL (ref 0.44–1.00)
GFR calc Af Amer: >60 mL/min (ref >60)
GFR calc non Af Amer: >60 mL/min (ref >60)
Glucose, Bld: 93 mg/dL (ref 70–99)
Potassium: 4.1 mmol/L (ref 3.5–5.1)
Sodium: 139 mmol/L (ref 135–145)
Total Bilirubin: 0.2 mg/dL — ABNORMAL LOW (ref 0.3–1.2)
Total Protein: 7.7 g/dL (ref 6.5–8.1)

## 2018-10-23 LAB — CBC WITH DIFFERENTIAL/PLATELET
Abs Immature Granulocytes: 0.02 10*3/uL (ref 0.00–0.07)
Basophils Absolute: 0.1 10*3/uL (ref 0.0–0.1)
Basophils Relative: 1 %
Eosinophils Absolute: 0.2 10*3/uL (ref 0.0–0.5)
Eosinophils Relative: 2 %
HCT: 36.8 % (ref 36.0–46.0)
Hemoglobin: 11.5 g/dL — ABNORMAL LOW (ref 12.0–15.0)
Immature Granulocytes: 0 %
Lymphocytes Relative: 23 %
Lymphs Abs: 1.5 10*3/uL (ref 0.7–4.0)
MCH: 26.7 pg (ref 26.0–34.0)
MCHC: 31.3 g/dL (ref 30.0–36.0)
MCV: 85.4 fL (ref 80.0–100.0)
Monocytes Absolute: 0.7 10*3/uL (ref 0.1–1.0)
Monocytes Relative: 11 %
Neutro Abs: 4.3 10*3/uL (ref 1.7–7.7)
Neutrophils Relative %: 63 %
Platelets: 264 10*3/uL (ref 150–400)
RBC: 4.31 MIL/uL (ref 3.87–5.11)
RDW: 15.5 % (ref 11.5–15.5)
WBC: 6.8 10*3/uL (ref 4.0–10.5)
nRBC: 0 % (ref 0.0–0.2)

## 2018-10-23 MED ORDER — PACLITAXEL PROTEIN-BOUND CHEMO INJECTION 100 MG
100.0000 mg/m2 | Freq: Once | INTRAVENOUS | Status: AC
  Administered 2018-10-23: 200 mg via INTRAVENOUS
  Filled 2018-10-23: qty 40

## 2018-10-23 MED ORDER — SODIUM CHLORIDE 0.9 % IV SOLN
420.0000 mg | Freq: Once | INTRAVENOUS | Status: AC
  Administered 2018-10-23: 420 mg via INTRAVENOUS
  Filled 2018-10-23: qty 14

## 2018-10-23 MED ORDER — ACETAMINOPHEN 325 MG PO TABS
650.0000 mg | ORAL_TABLET | Freq: Once | ORAL | Status: AC
  Administered 2018-10-23: 650 mg via ORAL

## 2018-10-23 MED ORDER — TRASTUZUMAB CHEMO 150 MG IV SOLR
6.0000 mg/kg | Freq: Once | INTRAVENOUS | Status: DC
  Filled 2018-10-23: qty 25

## 2018-10-23 MED ORDER — PROCHLORPERAZINE MALEATE 10 MG PO TABS
ORAL_TABLET | ORAL | Status: AC
  Filled 2018-10-23: qty 1

## 2018-10-23 MED ORDER — HEPARIN SOD (PORK) LOCK FLUSH 100 UNIT/ML IV SOLN
500.0000 [IU] | Freq: Once | INTRAVENOUS | Status: AC | PRN
  Administered 2018-10-23: 500 [IU]
  Filled 2018-10-23: qty 5

## 2018-10-23 MED ORDER — ACETAMINOPHEN 325 MG PO TABS
ORAL_TABLET | ORAL | Status: AC
  Filled 2018-10-23: qty 2

## 2018-10-23 MED ORDER — DIPHENHYDRAMINE HCL 25 MG PO CAPS
ORAL_CAPSULE | ORAL | Status: AC
  Filled 2018-10-23: qty 2

## 2018-10-23 MED ORDER — SODIUM CHLORIDE 0.9 % IV SOLN
Freq: Once | INTRAVENOUS | Status: AC
  Administered 2018-10-23: 15:00:00 via INTRAVENOUS
  Filled 2018-10-23: qty 250

## 2018-10-23 MED ORDER — SODIUM CHLORIDE 0.9% FLUSH
10.0000 mL | INTRAVENOUS | Status: DC | PRN
  Administered 2018-10-23: 10 mL
  Filled 2018-10-23: qty 10

## 2018-10-23 MED ORDER — SODIUM CHLORIDE 0.9% FLUSH
10.0000 mL | Freq: Once | INTRAVENOUS | Status: AC
  Administered 2018-10-23: 10 mL
  Filled 2018-10-23: qty 10

## 2018-10-23 MED ORDER — TRASTUZUMAB CHEMO 150 MG IV SOLR
6.0000 mg/kg | Freq: Once | INTRAVENOUS | Status: AC
  Administered 2018-10-23: 525 mg via INTRAVENOUS
  Filled 2018-10-23: qty 25

## 2018-10-23 MED ORDER — PROCHLORPERAZINE MALEATE 10 MG PO TABS
10.0000 mg | ORAL_TABLET | Freq: Once | ORAL | Status: AC
  Administered 2018-10-23: 10 mg via ORAL

## 2018-10-23 MED ORDER — DIPHENHYDRAMINE HCL 25 MG PO CAPS
25.0000 mg | ORAL_CAPSULE | Freq: Once | ORAL | Status: AC
  Administered 2018-10-23: 25 mg via ORAL

## 2018-10-23 NOTE — Patient Instructions (Signed)
[facility] Discharge Instructions for Patients Receiving Chemotherapy  Today you received the following chemotherapy agents Abraxane,Herceptin,Perjeta  To help prevent nausea and vomiting after your treatment, we encourage you to take your nausea medication as directed  If you develop nausea and vomiting that is not controlled by your nausea medication, call the clinic.   BELOW ARE SYMPTOMS THAT SHOULD BE REPORTED IMMEDIATELY:  *FEVER GREATER THAN 100.5 F  *CHILLS WITH OR WITHOUT FEVER  NAUSEA AND VOMITING THAT IS NOT CONTROLLED WITH YOUR NAUSEA MEDICATION  *UNUSUAL SHORTNESS OF BREATH  *UNUSUAL BRUISING OR BLEEDING  TENDERNESS IN MOUTH AND THROAT WITH OR WITHOUT PRESENCE OF ULCERS  *URINARY PROBLEMS  *BOWEL PROBLEMS  UNUSUAL RASH Items with * indicate a potential emergency and should be followed up as soon as possible.  Feel free to call the clinic should you have any questions or concerns. The clinic phone number is [phone].  Please show the [place] at check-in to the Emergency Department and triage nurse.

## 2018-10-29 ENCOUNTER — Other Ambulatory Visit: Payer: Self-pay | Admitting: Oncology

## 2018-10-29 DIAGNOSIS — G629 Polyneuropathy, unspecified: Secondary | ICD-10-CM

## 2018-11-21 ENCOUNTER — Inpatient Hospital Stay: Payer: Medicare Other | Attending: Oncology

## 2018-11-21 ENCOUNTER — Telehealth: Payer: Self-pay | Admitting: General Practice

## 2018-11-21 ENCOUNTER — Other Ambulatory Visit: Payer: Self-pay | Admitting: Oncology

## 2018-11-21 ENCOUNTER — Inpatient Hospital Stay: Payer: Medicare Other

## 2018-11-21 VITALS — BP 129/79 | HR 82 | Temp 98.9°F | Resp 18

## 2018-11-21 DIAGNOSIS — Z171 Estrogen receptor negative status [ER-]: Secondary | ICD-10-CM | POA: Insufficient documentation

## 2018-11-21 DIAGNOSIS — C792 Secondary malignant neoplasm of skin: Secondary | ICD-10-CM | POA: Diagnosis not present

## 2018-11-21 DIAGNOSIS — G893 Neoplasm related pain (acute) (chronic): Secondary | ICD-10-CM | POA: Diagnosis not present

## 2018-11-21 DIAGNOSIS — I10 Essential (primary) hypertension: Secondary | ICD-10-CM | POA: Insufficient documentation

## 2018-11-21 DIAGNOSIS — C4911 Malignant neoplasm of connective and soft tissue of right upper limb, including shoulder: Secondary | ICD-10-CM

## 2018-11-21 DIAGNOSIS — C770 Secondary and unspecified malignant neoplasm of lymph nodes of head, face and neck: Secondary | ICD-10-CM | POA: Insufficient documentation

## 2018-11-21 DIAGNOSIS — Z5111 Encounter for antineoplastic chemotherapy: Secondary | ICD-10-CM | POA: Insufficient documentation

## 2018-11-21 DIAGNOSIS — F17210 Nicotine dependence, cigarettes, uncomplicated: Secondary | ICD-10-CM | POA: Insufficient documentation

## 2018-11-21 DIAGNOSIS — Z9011 Acquired absence of right breast and nipple: Secondary | ICD-10-CM | POA: Diagnosis not present

## 2018-11-21 DIAGNOSIS — C50211 Malignant neoplasm of upper-inner quadrant of right female breast: Secondary | ICD-10-CM | POA: Insufficient documentation

## 2018-11-21 DIAGNOSIS — Z5112 Encounter for antineoplastic immunotherapy: Secondary | ICD-10-CM | POA: Insufficient documentation

## 2018-11-21 DIAGNOSIS — Z170 Estrogen receptor positive status [ER+]: Secondary | ICD-10-CM

## 2018-11-21 DIAGNOSIS — Z86718 Personal history of other venous thrombosis and embolism: Secondary | ICD-10-CM | POA: Insufficient documentation

## 2018-11-21 DIAGNOSIS — Z923 Personal history of irradiation: Secondary | ICD-10-CM | POA: Insufficient documentation

## 2018-11-21 DIAGNOSIS — Z79899 Other long term (current) drug therapy: Secondary | ICD-10-CM | POA: Insufficient documentation

## 2018-11-21 LAB — COMPREHENSIVE METABOLIC PANEL
ALT: 13 U/L (ref 0–44)
AST: 24 U/L (ref 15–41)
Albumin: 3.5 g/dL (ref 3.5–5.0)
Alkaline Phosphatase: 117 U/L (ref 38–126)
Anion gap: 11 (ref 5–15)
BUN: 14 mg/dL (ref 8–23)
CO2: 30 mmol/L (ref 22–32)
Calcium: 8.6 mg/dL — ABNORMAL LOW (ref 8.9–10.3)
Chloride: 102 mmol/L (ref 98–111)
Creatinine, Ser: 1.03 mg/dL — ABNORMAL HIGH (ref 0.44–1.00)
GFR calc Af Amer: >60 mL/min (ref >60)
GFR calc non Af Amer: 58 mL/min — ABNORMAL LOW (ref >60)
Glucose, Bld: 128 mg/dL — ABNORMAL HIGH (ref 70–99)
Potassium: 3.7 mmol/L (ref 3.5–5.1)
Sodium: 143 mmol/L (ref 135–145)
Total Bilirubin: 0.2 mg/dL — ABNORMAL LOW (ref 0.3–1.2)
Total Protein: 7.5 g/dL (ref 6.5–8.1)

## 2018-11-21 LAB — CBC WITH DIFFERENTIAL (CANCER CENTER ONLY)
Abs Immature Granulocytes: 0.04 10*3/uL (ref 0.00–0.07)
Basophils Absolute: 0.1 10*3/uL (ref 0.0–0.1)
Basophils Relative: 1 %
Eosinophils Absolute: 0.2 10*3/uL (ref 0.0–0.5)
Eosinophils Relative: 2 %
HCT: 37.2 % (ref 36.0–46.0)
Hemoglobin: 11.6 g/dL — ABNORMAL LOW (ref 12.0–15.0)
Immature Granulocytes: 1 %
Lymphocytes Relative: 17 %
Lymphs Abs: 1.3 10*3/uL (ref 0.7–4.0)
MCH: 26.9 pg (ref 26.0–34.0)
MCHC: 31.2 g/dL (ref 30.0–36.0)
MCV: 86.1 fL (ref 80.0–100.0)
Monocytes Absolute: 0.5 10*3/uL (ref 0.1–1.0)
Monocytes Relative: 7 %
Neutro Abs: 5.3 10*3/uL (ref 1.7–7.7)
Neutrophils Relative %: 72 %
Platelet Count: 259 10*3/uL (ref 150–400)
RBC: 4.32 MIL/uL (ref 3.87–5.11)
RDW: 15.8 % — ABNORMAL HIGH (ref 11.5–15.5)
WBC Count: 7.4 10*3/uL (ref 4.0–10.5)
nRBC: 0 % (ref 0.0–0.2)

## 2018-11-21 MED ORDER — PACLITAXEL PROTEIN-BOUND CHEMO INJECTION 100 MG
100.0000 mg/m2 | Freq: Once | INTRAVENOUS | Status: AC
  Administered 2018-11-21: 200 mg via INTRAVENOUS
  Filled 2018-11-21: qty 40

## 2018-11-21 MED ORDER — PROCHLORPERAZINE MALEATE 10 MG PO TABS
10.0000 mg | ORAL_TABLET | Freq: Once | ORAL | Status: AC
  Administered 2018-11-21: 10 mg via ORAL

## 2018-11-21 MED ORDER — ACETAMINOPHEN 325 MG PO TABS
650.0000 mg | ORAL_TABLET | Freq: Once | ORAL | Status: AC
  Administered 2018-11-21: 650 mg via ORAL

## 2018-11-21 MED ORDER — TRASTUZUMAB CHEMO 150 MG IV SOLR
6.0000 mg/kg | Freq: Once | INTRAVENOUS | Status: DC
  Filled 2018-11-21: qty 25

## 2018-11-21 MED ORDER — SODIUM CHLORIDE 0.9 % IV SOLN
Freq: Once | INTRAVENOUS | Status: AC
  Administered 2018-11-21: 15:00:00 via INTRAVENOUS
  Filled 2018-11-21: qty 250

## 2018-11-21 MED ORDER — SODIUM CHLORIDE 0.9% FLUSH
10.0000 mL | INTRAVENOUS | Status: DC | PRN
  Administered 2018-11-21: 10 mL
  Filled 2018-11-21: qty 10

## 2018-11-21 MED ORDER — PROCHLORPERAZINE MALEATE 10 MG PO TABS
ORAL_TABLET | ORAL | Status: AC
  Filled 2018-11-21: qty 1

## 2018-11-21 MED ORDER — TRASTUZUMAB CHEMO 150 MG IV SOLR
6.0000 mg/kg | Freq: Once | INTRAVENOUS | Status: AC
  Administered 2018-11-21: 525 mg via INTRAVENOUS
  Filled 2018-11-21: qty 25

## 2018-11-21 MED ORDER — SODIUM CHLORIDE 0.9 % IV SOLN
420.0000 mg | Freq: Once | INTRAVENOUS | Status: AC
  Administered 2018-11-21: 420 mg via INTRAVENOUS
  Filled 2018-11-21: qty 14

## 2018-11-21 MED ORDER — HEPARIN SOD (PORK) LOCK FLUSH 100 UNIT/ML IV SOLN
500.0000 [IU] | Freq: Once | INTRAVENOUS | Status: AC | PRN
  Administered 2018-11-21: 500 [IU]
  Filled 2018-11-21: qty 5

## 2018-11-21 MED ORDER — DIPHENHYDRAMINE HCL 25 MG PO CAPS
25.0000 mg | ORAL_CAPSULE | Freq: Once | ORAL | Status: AC
  Administered 2018-11-21: 25 mg via ORAL

## 2018-11-21 MED ORDER — DIPHENHYDRAMINE HCL 25 MG PO CAPS
ORAL_CAPSULE | ORAL | Status: AC
  Filled 2018-11-21: qty 1

## 2018-11-21 MED ORDER — ACETAMINOPHEN 325 MG PO TABS
ORAL_TABLET | ORAL | Status: AC
  Filled 2018-11-21: qty 2

## 2018-11-21 MED ORDER — SODIUM CHLORIDE 0.9 % IV SOLN
Freq: Once | INTRAVENOUS | Status: DC
  Filled 2018-11-21: qty 250

## 2018-11-21 NOTE — Telephone Encounter (Signed)
[facility] CSW Progress Notes  Call from patient, wants help applying for financial assistance.  Pt left name of agency, but message was difficult to understand.  CSW will attempt to see patient in infusion room, also called patient to ask for more details.  Edwyna Shell, LCSW Clinical Social Worker Phone:  [phone]

## 2018-11-22 ENCOUNTER — Other Ambulatory Visit: Payer: Self-pay | Admitting: *Deleted

## 2018-11-22 MED ORDER — MAGIC MOUTHWASH: ORAL | 1 refills | Status: DC

## 2018-11-22 MED ORDER — POTASSIUM CHLORIDE ER 10 MEQ PO CPCR: 20.0000 meq | ORAL_CAPSULE | Freq: Every day | ORAL | 2 refills | Status: DC

## 2018-11-22 NOTE — Telephone Encounter (Signed)
This RN spoke with pt per her call stating onset of " my mouth is hurting ".  [name] denies any noted white patches or sores " just feels like I ate something too hot ".  This RN refilled MMW per above.

## 2018-12-11 ENCOUNTER — Encounter: Payer: Self-pay | Admitting: *Deleted

## 2018-12-11 NOTE — Progress Notes (Signed)
[facility] Work  Patient walked in to [place] office with [place] application and request to submit.  CSW reviewed application, completed requested information, and submitted.    Johnnye Lana, MSW, LCSW, OSW-C Clinical Social Worker [facility] [phone]

## 2018-12-12 ENCOUNTER — Telehealth: Payer: Self-pay

## 2018-12-12 DIAGNOSIS — G8929 Other chronic pain: Secondary | ICD-10-CM

## 2018-12-12 NOTE — Progress Notes (Signed)
Referral to Dr. Maryjean Ka successfully faxed.

## 2018-12-12 NOTE — Telephone Encounter (Signed)
Pt called requesting referral to pain specialist - Dr. Jana Hakim gave recommendations for Dr. Maryjean Ka.    Pt aware and voiced understanding and agreement.  Pt asked if we could also send over referral to Dr. Mirna Mires as well so she can make decision based on feedback.  Will send referrals over.

## 2018-12-19 ENCOUNTER — Other Ambulatory Visit: Payer: Self-pay | Admitting: Oncology

## 2018-12-19 ENCOUNTER — Inpatient Hospital Stay: Payer: Medicare Other

## 2018-12-19 ENCOUNTER — Inpatient Hospital Stay: Payer: Medicare Other | Attending: Oncology

## 2018-12-19 ENCOUNTER — Encounter: Payer: Self-pay | Admitting: Adult Health

## 2018-12-19 ENCOUNTER — Inpatient Hospital Stay (HOSPITAL_BASED_OUTPATIENT_CLINIC_OR_DEPARTMENT_OTHER): Payer: Medicare Other | Admitting: Adult Health

## 2018-12-19 ENCOUNTER — Telehealth: Payer: Self-pay | Admitting: Adult Health

## 2018-12-19 VITALS — BP 115/63 | HR 81 | Temp 99.1°F | Resp 18 | Ht 65.0 in | Wt 193.3 lb

## 2018-12-19 DIAGNOSIS — C4911 Malignant neoplasm of connective and soft tissue of right upper limb, including shoulder: Secondary | ICD-10-CM

## 2018-12-19 DIAGNOSIS — Z170 Estrogen receptor positive status [ER+]: Secondary | ICD-10-CM

## 2018-12-19 DIAGNOSIS — C50211 Malignant neoplasm of upper-inner quadrant of right female breast: Secondary | ICD-10-CM

## 2018-12-19 DIAGNOSIS — Z5112 Encounter for antineoplastic immunotherapy: Secondary | ICD-10-CM | POA: Insufficient documentation

## 2018-12-19 DIAGNOSIS — F17210 Nicotine dependence, cigarettes, uncomplicated: Secondary | ICD-10-CM | POA: Insufficient documentation

## 2018-12-19 DIAGNOSIS — C770 Secondary and unspecified malignant neoplasm of lymph nodes of head, face and neck: Secondary | ICD-10-CM

## 2018-12-19 DIAGNOSIS — Z9011 Acquired absence of right breast and nipple: Secondary | ICD-10-CM | POA: Diagnosis not present

## 2018-12-19 DIAGNOSIS — Z803 Family history of malignant neoplasm of breast: Secondary | ICD-10-CM

## 2018-12-19 DIAGNOSIS — Z79899 Other long term (current) drug therapy: Secondary | ICD-10-CM

## 2018-12-19 DIAGNOSIS — Z800 Family history of malignant neoplasm of digestive organs: Secondary | ICD-10-CM | POA: Diagnosis not present

## 2018-12-19 DIAGNOSIS — Z171 Estrogen receptor negative status [ER-]: Secondary | ICD-10-CM | POA: Diagnosis not present

## 2018-12-19 DIAGNOSIS — Z9221 Personal history of antineoplastic chemotherapy: Secondary | ICD-10-CM | POA: Diagnosis not present

## 2018-12-19 DIAGNOSIS — Z923 Personal history of irradiation: Secondary | ICD-10-CM

## 2018-12-19 DIAGNOSIS — Z86718 Personal history of other venous thrombosis and embolism: Secondary | ICD-10-CM

## 2018-12-19 DIAGNOSIS — Z5111 Encounter for antineoplastic chemotherapy: Secondary | ICD-10-CM | POA: Insufficient documentation

## 2018-12-19 DIAGNOSIS — G893 Neoplasm related pain (acute) (chronic): Secondary | ICD-10-CM | POA: Diagnosis not present

## 2018-12-19 DIAGNOSIS — Z95828 Presence of other vascular implants and grafts: Secondary | ICD-10-CM

## 2018-12-19 LAB — COMPREHENSIVE METABOLIC PANEL
ALT: 8 U/L (ref 0–44)
AST: 18 U/L (ref 15–41)
Albumin: 3.8 g/dL (ref 3.5–5.0)
Alkaline Phosphatase: 104 U/L (ref 38–126)
Anion gap: 11 (ref 5–15)
BUN: 14 mg/dL (ref 8–23)
CO2: 28 mmol/L (ref 22–32)
Calcium: 9.2 mg/dL (ref 8.9–10.3)
Chloride: 103 mmol/L (ref 98–111)
Creatinine, Ser: 1.03 mg/dL — ABNORMAL HIGH (ref 0.44–1.00)
GFR calc Af Amer: >60 mL/min (ref >60)
GFR calc non Af Amer: 58 mL/min — ABNORMAL LOW (ref >60)
Glucose, Bld: 95 mg/dL (ref 70–99)
Potassium: 3.9 mmol/L (ref 3.5–5.1)
Sodium: 142 mmol/L (ref 135–145)
Total Bilirubin: 0.3 mg/dL (ref 0.3–1.2)
Total Protein: 7.6 g/dL (ref 6.5–8.1)

## 2018-12-19 LAB — CBC WITH DIFFERENTIAL/PLATELET
Abs Immature Granulocytes: 0.03 10*3/uL (ref 0.00–0.07)
Basophils Absolute: 0.1 10*3/uL (ref 0.0–0.1)
Basophils Relative: 1 %
Eosinophils Absolute: 0.1 10*3/uL (ref 0.0–0.5)
Eosinophils Relative: 1 %
HCT: 38.6 % (ref 36.0–46.0)
Hemoglobin: 12.2 g/dL (ref 12.0–15.0)
Immature Granulocytes: 0 %
Lymphocytes Relative: 15 %
Lymphs Abs: 1.4 10*3/uL (ref 0.7–4.0)
MCH: 26.8 pg (ref 26.0–34.0)
MCHC: 31.6 g/dL (ref 30.0–36.0)
MCV: 84.8 fL (ref 80.0–100.0)
Monocytes Absolute: 0.9 10*3/uL (ref 0.1–1.0)
Monocytes Relative: 9 %
Neutro Abs: 7.3 10*3/uL (ref 1.7–7.7)
Neutrophils Relative %: 74 %
Platelets: 196 10*3/uL (ref 150–400)
RBC: 4.55 MIL/uL (ref 3.87–5.11)
RDW: 15.7 % — ABNORMAL HIGH (ref 11.5–15.5)
WBC: 9.8 10*3/uL (ref 4.0–10.5)
nRBC: 0 % (ref 0.0–0.2)

## 2018-12-19 MED ORDER — ACETAMINOPHEN 325 MG PO TABS
650.0000 mg | ORAL_TABLET | Freq: Once | ORAL | Status: AC
  Administered 2018-12-19: 650 mg via ORAL

## 2018-12-19 MED ORDER — SODIUM CHLORIDE 0.9 % IV SOLN
420.0000 mg | Freq: Once | INTRAVENOUS | Status: AC
  Administered 2018-12-19: 420 mg via INTRAVENOUS
  Filled 2018-12-19: qty 14

## 2018-12-19 MED ORDER — HEPARIN SOD (PORK) LOCK FLUSH 100 UNIT/ML IV SOLN
500.0000 [IU] | Freq: Once | INTRAVENOUS | Status: AC | PRN
  Administered 2018-12-19: 500 [IU]
  Filled 2018-12-19: qty 5

## 2018-12-19 MED ORDER — SODIUM CHLORIDE 0.9 % IV SOLN
Freq: Once | INTRAVENOUS | Status: AC
  Administered 2018-12-19: 12:00:00 via INTRAVENOUS
  Filled 2018-12-19: qty 250

## 2018-12-19 MED ORDER — SODIUM CHLORIDE 0.9% FLUSH
10.0000 mL | INTRAVENOUS | Status: DC | PRN
  Administered 2018-12-19: 10 mL
  Filled 2018-12-19: qty 10

## 2018-12-19 MED ORDER — PROCHLORPERAZINE MALEATE 10 MG PO TABS
ORAL_TABLET | ORAL | Status: AC
  Filled 2018-12-19: qty 1

## 2018-12-19 MED ORDER — DIPHENHYDRAMINE HCL 25 MG PO CAPS
25.0000 mg | ORAL_CAPSULE | Freq: Once | ORAL | Status: AC
  Administered 2018-12-19: 25 mg via ORAL

## 2018-12-19 MED ORDER — PACLITAXEL PROTEIN-BOUND CHEMO INJECTION 100 MG
100.0000 mg/m2 | Freq: Once | INTRAVENOUS | Status: AC
  Administered 2018-12-19: 200 mg via INTRAVENOUS
  Filled 2018-12-19: qty 40

## 2018-12-19 MED ORDER — SODIUM CHLORIDE 0.9% FLUSH
10.0000 mL | INTRAVENOUS | Status: DC | PRN
  Administered 2018-12-19: 10 mL via INTRAVENOUS
  Filled 2018-12-19: qty 10

## 2018-12-19 MED ORDER — DIPHENHYDRAMINE HCL 25 MG PO CAPS
ORAL_CAPSULE | ORAL | Status: AC
  Filled 2018-12-19: qty 1

## 2018-12-19 MED ORDER — ACETAMINOPHEN 325 MG PO TABS
ORAL_TABLET | ORAL | Status: AC
  Filled 2018-12-19: qty 2

## 2018-12-19 MED ORDER — TRASTUZUMAB CHEMO 150 MG IV SOLR
6.0000 mg/kg | Freq: Once | INTRAVENOUS | Status: AC
  Administered 2018-12-19: 525 mg via INTRAVENOUS
  Filled 2018-12-19: qty 25

## 2018-12-19 MED ORDER — PROCHLORPERAZINE MALEATE 10 MG PO TABS
10.0000 mg | ORAL_TABLET | Freq: Once | ORAL | Status: AC
  Administered 2018-12-19: 10 mg via ORAL

## 2018-12-19 NOTE — Progress Notes (Signed)
Per Dr. Jana Hakim it is ok to treat with Herceptin using ECHO from 08/2018. Per Dr. Jana Hakim patient to have ECHO scheduled every 6 months.

## 2018-12-19 NOTE — Telephone Encounter (Signed)
Tried to reach regarding 3/11 I did leave a voicemail

## 2018-12-19 NOTE — Patient Instructions (Signed)
[facility] Discharge Instructions for Patients Receiving Chemotherapy  Today you received the following chemotherapy agents Herceptin, Perjeta, Abraxane.  To help prevent nausea and vomiting after your treatment, we encourage you to take your nausea medication as directed.  If you develop nausea and vomiting that is not controlled by your nausea medication, call the clinic.   BELOW ARE SYMPTOMS THAT SHOULD BE REPORTED IMMEDIATELY:  *FEVER GREATER THAN 100.5 F  *CHILLS WITH OR WITHOUT FEVER  NAUSEA AND VOMITING THAT IS NOT CONTROLLED WITH YOUR NAUSEA MEDICATION  *UNUSUAL SHORTNESS OF BREATH  *UNUSUAL BRUISING OR BLEEDING  TENDERNESS IN MOUTH AND THROAT WITH OR WITHOUT PRESENCE OF ULCERS  *URINARY PROBLEMS  *BOWEL PROBLEMS  UNUSUAL RASH Items with * indicate a potential emergency and should be followed up as soon as possible.  Feel free to call the clinic should you have any questions or concerns. The clinic phone number is [phone].  Please show the CHEMO ALERT CARD at check-in to the Emergency Department and triage nurse.   

## 2018-12-19 NOTE — Progress Notes (Signed)
[id number]

## 2018-12-19 NOTE — Progress Notes (Signed)
[facility]  [phone] [phone]     ID: [name]   DOB: 05/13/1956  MR#: [id number]  JZP#:[id number]  Patient Care Team: Tawny Asal as PCP - General (Physician Assistant) [name], [name], MD as Consulting Physician (Oncology) [name], MD as Consulting Physician (Radiation Oncology) [name], [name], MD as Consulting Physician (Cardiology) [name], [name] 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   The patient is now back on Abraxane which she is tolerating well.  The patient also continues on trastuzumab and pertuzumab, which she is tolerating well.  She receives these every 4 weeks.  She feels like the tumors in her back are responding to treatments.    REVIEW OF SYSTEMS: [name] is feeling well.  She says that she is in good spirits.  She has no issues other than conitnued right arm swelling and numbness in her right hand that is chronic and has been going on and unchanged for several years.  She denies headaches or vision changes.  She is without fevers, chills, or weight loss.  She denies nausea, vomiting, bowel/bladder concerns.  She did note some mucositis after her treatment four weeks ago, but this resolved with magic mouthwash.  Otherwise, a detailed ROS was non contributory today.     BREAST CANCER HISTORY: From the original intake note:  The patient originally presented in May 2003 when she noticed a lump in the upper inner quadrant of her right breast in September 2003.  She sought attention and had a mammogram which showed an obvious carcinoma in the upper inner quadrant of the right breast,  approximately 2 cm.  There were some enlarged lymph nodes in the axilla and an FNA done showed those consistent with malignant cells, most likely an invasive ductal carcinoma.  At that point she was unsure of what to do and was referred by Dr. [name] for a discussion of her treatment options.  By biopsy, it was ER/PR negative and HER-2 was 3+.  DNA index was 1.42.  We reiterated that it was most important to have her disease surgically addressed at that point and had recommended lumpectomy and axillary nodes to be addressed with which she followed through and on 04-03-02, Dr. [name] performed a right partial mastectomy and a right axillary lymph node dissection.  Final pathology revealed a 2.4 cm. high grade, Grade III invasive ductal carcinoma with an adjacent .8 cm. also high grade invasive ductal carcinoma which was felt to represent an intramammary metastasis rather than a second primary.  A smaller mass was just medial to the larger mass.  The smaller mass was associated with high grade DCIS component.  There was no definite lymphovascular invasion identified.  However, one of fourteen axillary lymph nodes did contain a 1.5 cm. metastatic deposit.  Postoperatively she did very well.  We reiterated the fact that she did need adjuvant chemotherapy, however, she refused and decided that she would pursue radiation.  She received radiation and completed that on 08-14-02. Her subsequent history is as detailed below.   PAST MEDICAL HISTORY: Past Medical History:  Diagnosis Date  . Breast cancer ([place])   . DVT (deep venous thrombosis) ([place]) 10/07/2011  . Fever 02/06/2018  .  Hypertension   . Radiation 11/29/2005-12/29/2005   right supraclavicular area 4147 cGy  . Radiation 06/26/02-08/14/02   right breast 5040 cGy, tumor bed boosted to 1260 cGy  . Rheumatic fever     PAST SURGICAL HISTORY: Past Surgical History:  Procedure Laterality Date  . BREAST SURGERY    . MASTECTOMY     bilateral  . PORT A  CATH REVISION      FAMILY HISTORY Family History  Problem Relation Age of Onset  . Pancreatic cancer Mother   . Kidney cancer Sister 82  . Breast cancer Sister 38  . Breast cancer Other 36  The patient's father died in his 20F  from complications of alcohol abuse. The patient's mother died in her 69s from pancreatic cancer. The patient has 6 brothers, 2 sisters. Both her sisters have had breast cancer, both diagnosed after the age of 74. The patient has not had genetic testing so far.   GYNECOLOGIC HISTORY: Menarche age 48; first live birth age 33. She is GXP4. She is not sure when she stopped having periods. She never used hormone replacement.  SOCIAL HISTORY: The patient has worked in the past as a Psychologist, counselling. At home in addition to the patient are her husband [name], originally from [place], who works as a Administrator.  Also living in the home is her daughter [name] (she is studying to be a Marine scientist).  The other 2 children are [name], who has a college degree in psychology and works as a Probation officer; and [name], who lives in [place] and works as a Higher education careers adviser. [name] also sometimes stays in the home. In addition the patient has an aide who helps her almost daily.   ADVANCED DIRECTIVES: Not in place  HEALTH MAINTENANCE: Social History   Tobacco Use  . Smoking status: Light Tobacco Smoker  . Smokeless tobacco: Never Used  Substance Use Topics  . Alcohol use: Yes    Comment: Rarely  . Drug use: No     Colonoscopy:  Not on file  PAP:  Not on file  Bone density:  Not on file  Lipid panel:  Not on file    Allergies  Allergen Reactions  . Pork-Derived Products Other (See Comments)    Pt says she just doesn't eat pork; has no reaction to pork products  . Tramadol Nausea Only  . Hydrocodone-Acetaminophen Itching and Rash    Current Outpatient Medications  Medication Sig Dispense Refill  . ALPRAZolam (XANAX) 0.5 MG tablet Take 1 tablet (0.5 mg total) by mouth 2  (two) times daily as needed for anxiety. 45 tablet 0  . Ascorbic Acid (VITAMIN C) 100 MG tablet Take 100 mg by mouth daily.      Marland Kitchen azithromycin (ZITHROMAX Z-PAK) 250 MG tablet Take two tablets PO 1st day - 1 Tablet daily after X 4 days 6 each 0  . cholecalciferol (VITAMIN D) 1000 units tablet Take 1 tablet (1,000 Units total) by mouth daily. 90 tablet 4  . gabapentin (NEURONTIN) 300 MG capsule TAKE 1 CAPSULE (300 MG TOTAL) BY MOUTH 2 (TWO) TIMES DAILY AS NEEDED. 60 capsule 1  . hydrOXYzine (ATARAX/VISTARIL) 25 MG tablet TAKE 1 TABLET BY MOUTH THREE TIMES A DAY AS NEEDED 270 tablet 1  . magic mouthwash SOLN SWISH AND SPIT OR SWALLOW WITH 5MLS BEFORE MEALS AND AT BEDTIME 240 mL 1  . methadone (DOLOPHINE) 10 MG tablet Take 10 mg by mouth 5 (five) times daily.  0  . Multiple Vitamin (MULTIVITAMIN) capsule  Take 1 capsule by mouth daily.      Marland Kitchen oxyCODONE (ROXICODONE) 15 MG immediate release tablet Take 15 mg by mouth 3 (three) times daily.  0  . potassium chloride (MICRO-K) 10 MEQ CR capsule Take 2 capsules (20 mEq total) by mouth daily. 60 capsule 2  . prochlorperazine (COMPAZINE) 10 MG tablet Take 1 tablet (10 mg total) by mouth every 6 (six) hours as needed for nausea or vomiting. 30 tablet 0  . triamterene-hydrochlorothiazide (DYAZIDE) 37.5-25 MG capsule Take 1 each (1 capsule total) by mouth every morning. 30 capsule 3   No current facility-administered medications for this visit.    Facility-Administered Medications Ordered in Other Visits  Medication Dose Route Frequency Provider Last Rate Last Dose  . heparin lock flush 100 unit/mL  500 Units Intracatheter Once PRN [name], [name], MD      . PACLitaxel-protein bound (ABRAXANE) chemo infusion 200 mg  100 mg/m2 (Treatment Plan Recorded) Intravenous Once [name], [name], MD      . pertuzumab (PERJETA) 420 mg in sodium chloride 0.9 % 250 mL chemo infusion  420 mg Intravenous Once Gardenia Phlegm, NP      . sodium chloride 0.9 %  injection 10 mL  10 mL Intravenous PRN [name], [name], MD   10 mL at 03/04/14 1421  . sodium chloride flush (NS) 0.9 % injection 10 mL  10 mL Intracatheter PRN [name], [name], MD      . trastuzumab (HERCEPTIN) 525 mg in sodium chloride 0.9 % 250 mL chemo infusion  6 mg/kg (Treatment Plan Recorded) Intravenous Once Gardenia Phlegm, NP        OBJECTIVE:  Vitals:   12/19/18 1040  BP: 115/63  Pulse: 81  Resp: 18  Temp: 99.1 F (37.3 C)  SpO2: 96%     Body mass index is 32.17 kg/m.      ECOG FS: 1 GENERAL: Patient is a well appearing female in no acute distress HEENT:  Sclerae anicteric.  Oropharynx clear and moist. No ulcerations or evidence of oropharyngeal candidiasis. Neck is supple.  NODES:  No cervical, supraclavicular, or axillary lymphadenopathy palpated.  BREAST EXAM:  Deferred. LUNGS:  Clear to auscultation bilaterally.  No wheezes or rhonchi. HEART:  Regular rate and rhythm. No murmur appreciated. ABDOMEN:  Soft, nontender.  Positive, normoactive bowel sounds. No organomegaly palpated. MSK:  No focal spinal tenderness to palpation. Full range of motion bilaterally in the upper extremities. EXTREMITIES:  No peripheral edema.   SKIN:  Clear with no obvious rashes or skin changes. No nail dyscrasia. See picture below for appearance of her back. NEURO:  Nonfocal. Well oriented.  Appropriate affect.      Photo 07/04/2017     Photo 08/23/2018   Photo 12/19/2018        LAB RESULTS:.  Lab Results  Component Value Date   WBC 9.8 12/19/2018   NEUTROABS 7.3 12/19/2018   HGB 12.2 12/19/2018   HCT 38.6 12/19/2018   MCV 84.8 12/19/2018   PLT 196 12/19/2018      Chemistry      Component Value Date/Time   NA 142 12/19/2018 0953   NA 142 11/09/2017 1214   K 3.9 12/19/2018 0953   K 3.9 11/09/2017 1214   CL 103 12/19/2018 0953   CL 101 03/07/2013 1411   CO2 28 12/19/2018 0953   CO2 32 (H) 11/09/2017 1214   BUN 14 12/19/2018 0953   BUN 19.1  11/09/2017 1214   CREATININE  1.03 (H) 12/19/2018 0953   CREATININE 1.04 (H) 05/24/2018 0848   CREATININE 1.0 11/09/2017 1214      Component Value Date/Time   CALCIUM 9.2 12/19/2018 0953   CALCIUM 9.1 11/09/2017 1214   ALKPHOS 104 12/19/2018 0953   ALKPHOS 120 11/09/2017 1214   AST 18 12/19/2018 0953   AST 17 05/24/2018 0848   AST 30 11/09/2017 1214   ALT 8 12/19/2018 0953   ALT 11 05/24/2018 0848   ALT 19 11/09/2017 1214   BILITOT 0.3 12/19/2018 0953   BILITOT 0.4 05/24/2018 0848   BILITOT <0.22 11/09/2017 1214       STUDIES: No results found.   ASSESSMENT:  63 y.o. [name] with stage IV breast cancer manifested chiefly by loco-regional nodal disease (neck, chest) and skin involvement, without liver, bone, or brain metastases documented   (1) Status post right upper inner quadrant lumpectomy and sentinel lymph node sampling 03/04/2002 for 2 separate foci of invasive ductal carcinoma, mpT2 pN1 or stage IIB, both foci grade 3, both estrogen and progesterone receptor positive, both HercepTest 3+, Mib-1 56%  (2) Reexcision for margins 05/27/2002 showed no residual cancer in the breast.  (3) The patient refused adjuvant systemic therapy.  (4) Adjuvant radiation treatment completed 08/14/2002.  (5) recurrence in the right breast in 02/2004 showing a morphologically different tumor, again grade 3, again estrogen and progesterone receptor negative, with an MIB-1 of 14% and Herceptest 3+.  (5) Between 03/2004 and 07/2004 she received dose dense Doxorubicin/Cyclophosphamide x 4 given with trastuzumab, followed by weekly Docetaxel x 8, again given with trastuzumab.  (6) Right mastectomy 07/13/2004 showed scattered microscopic foci of residual disease over an area  greater than 5 cm. Margins were negative.  (7) Postoperative Docetaxel continued until 09/2004.  (8) Trastuzumab (Herceptin) given 08/2004 through 01/2012 with some brief interruptions.  RECURRENT/ STAGE IV  DISEASE December 2006 (9) Isolated right cervical nodal recurrence 10/2005, treated with radiation to the right supraclavicular area (total 41.5 gray) completed 12/29/2005.  (10) Navelbine given together with Herceptin  11/2005 through 03/2006.  (9) Left mastectomy 02/13/2006 for ductal carcinoma in situ, grade 2, estrogen and progesterone receptor negative, with negative margins; 0 of 3 lymph nodes involved  (10) treated with Lapatinib and Capecitabine before 10/2009, for an unclear duration and with unclear results (cannot locate data on chart review).  (11) Status post right supraclavicular lymph node biopsy 09/2010 again positive for an invasive ductal carcinoma, estrogen and progesterone receptor negative, HER-2 positive by C[facility] with a ratio 4.25.  (12) Navelbine given together with Herceptin between 05/2011 and 11/2011.  (13) Carboplatin/ Gemcitabine/ Herceptin given for 2 cycles, in 12/2011 and 01/2012.  (14) TDM-1 ([place]) started 02/2012. Last dose 10/02/2013 after which the patient discontinued treatment at her own discretion. Echo on December 2014 showed a well preserved ejection fraction.  (15) Deep vein thrombosis of the right upper extremity documented 04/20/2011.  She completed anticoagulant therapy with Coumadin on 03/25/2013.  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 Left chest Port-A-Cath placed on 04/08/2013; being flushed every 6 weeks  RIGHT UPPER EXTREMITY ANGIOSARCOMA VS BREAST CANCER: August 2016 (18) treated at [facility] [place] August 2016 with paclitaxel, trastuzumab and pertuzumab 1 cycle, afterwards referred to hospice  (19) under the care of hospice of [facility]  August 2016 to 05/08/2016, when the patient opted  for a second try at chemotherapy  (20) started low-dose Abraxane, trastuzumab and pertuzumab 06/07/2016, to be repeated every 4 weeks.   (a) pretreatment echocardiogram 06/06/2016 showed a 60-65% ejection fraction  (b) significant compliance problems compromise response to treatment  (c)  echocardiogram 02/20/17 LVEF 55-60%  (d) Abraxane discontinued 07/04/2017 because of possible neuropathy symptoms  (e) Abraxane resumed 09/27/2017  (f) echocardiogram 07/20/2017 showed an ejection fraction of 60-65%  (g) echo 01/22/2018 showed an ejection fraction of 50-55%  (h) CT scan of the chest 05/03/2018 shows no disease involving the lungs liver or lymph nodes, with stable subcutaneous masses as previously noted  (I) Abraxane discontinued August 2019 because of concerns regarding neuropathy-- gemcitabine substituted  (j) echocardiogram 08/20/2018 shows an ejection fraction in the 55-60% range (done every 6 months)  (k) gemcitabine discontinued because of multiple symptoms, Abraxane restarted 09/11/2018, given once every 4 weeks  (21) Chronic pain secondary to known metastatic disease  (a) patient signed pain contract 10/19/2016  (b) as of 11/16/2016 receives her pain medications from Dr [name]  (c)  Dr [name] no longer able to prescribe as of February 2019  (d) all pain medications now through [facility] clinic (as of April 2019)   PLAN: [name] is doing well today.  She is tolerating her treatment with every 4 week Abraxane, Trastuzumab, and Pertuzumab well.  Her labs are stable and I reviewed those with her in great detail.  She has no sign of peripheral neuropathy.    [name] will need an echo in April, 2020.  I placed orders for this today.      [name] will return in 4 weeks for labs, evaluation and her next treatment.  She knows to call for any other issue that may develop before that visit.  A total of (20) minutes of face-to-face time was spent with this patient with greater  than 50% of that time in counseling and care-coordination.  Wilber Bihari, NP  12/19/18 12:21 PM Medical Oncology and Hematology [facility] [address] [place], [place] [address] Tel. [phone]    Fax. [phone]

## 2018-12-31 ENCOUNTER — Other Ambulatory Visit: Payer: Self-pay | Admitting: Oncology

## 2018-12-31 ENCOUNTER — Ambulatory Visit (INDEPENDENT_AMBULATORY_CARE_PROVIDER_SITE_OTHER): Payer: Medicare Other | Admitting: Primary Care

## 2018-12-31 DIAGNOSIS — I10 Essential (primary) hypertension: Secondary | ICD-10-CM

## 2019-01-01 ENCOUNTER — Other Ambulatory Visit: Payer: Self-pay | Admitting: *Deleted

## 2019-01-01 MED ORDER — AZITHROMYCIN 250 MG PO TABS: ORAL_TABLET | ORAL | 0 refills | Status: DC

## 2019-01-01 NOTE — Telephone Encounter (Signed)
Crucita called to this RN to state onset of productive cough with " green sputum ".  She denies any fevers - just congestion and cough.  Per MD prescription obtained for antibiotic and sent to verified pharmacy.

## 2019-01-16 ENCOUNTER — Other Ambulatory Visit: Payer: Medicare Other

## 2019-01-16 ENCOUNTER — Inpatient Hospital Stay: Payer: Medicare Other

## 2019-01-16 ENCOUNTER — Other Ambulatory Visit: Payer: Self-pay

## 2019-01-16 ENCOUNTER — Encounter: Payer: Self-pay | Admitting: Adult Health

## 2019-01-16 ENCOUNTER — Inpatient Hospital Stay: Payer: Medicare Other | Attending: Oncology

## 2019-01-16 ENCOUNTER — Inpatient Hospital Stay (HOSPITAL_BASED_OUTPATIENT_CLINIC_OR_DEPARTMENT_OTHER): Payer: Medicare Other | Admitting: Adult Health

## 2019-01-16 ENCOUNTER — Ambulatory Visit: Payer: Medicare Other

## 2019-01-16 VITALS — BP 140/84 | HR 63 | Temp 98.2°F | Resp 17 | Ht 65.0 in | Wt 199.9 lb

## 2019-01-16 DIAGNOSIS — C4911 Malignant neoplasm of connective and soft tissue of right upper limb, including shoulder: Secondary | ICD-10-CM

## 2019-01-16 DIAGNOSIS — Z5112 Encounter for antineoplastic immunotherapy: Secondary | ICD-10-CM | POA: Insufficient documentation

## 2019-01-16 DIAGNOSIS — Z171 Estrogen receptor negative status [ER-]: Secondary | ICD-10-CM | POA: Insufficient documentation

## 2019-01-16 DIAGNOSIS — Z5111 Encounter for antineoplastic chemotherapy: Secondary | ICD-10-CM | POA: Diagnosis not present

## 2019-01-16 DIAGNOSIS — C770 Secondary and unspecified malignant neoplasm of lymph nodes of head, face and neck: Secondary | ICD-10-CM | POA: Insufficient documentation

## 2019-01-16 DIAGNOSIS — C50211 Malignant neoplasm of upper-inner quadrant of right female breast: Secondary | ICD-10-CM

## 2019-01-16 DIAGNOSIS — C7931 Secondary malignant neoplasm of brain: Secondary | ICD-10-CM | POA: Diagnosis not present

## 2019-01-16 DIAGNOSIS — Z95828 Presence of other vascular implants and grafts: Secondary | ICD-10-CM

## 2019-01-16 DIAGNOSIS — C792 Secondary malignant neoplasm of skin: Secondary | ICD-10-CM

## 2019-01-16 DIAGNOSIS — Z9013 Acquired absence of bilateral breasts and nipples: Secondary | ICD-10-CM | POA: Diagnosis not present

## 2019-01-16 DIAGNOSIS — Z79899 Other long term (current) drug therapy: Secondary | ICD-10-CM | POA: Insufficient documentation

## 2019-01-16 DIAGNOSIS — Z170 Estrogen receptor positive status [ER+]: Secondary | ICD-10-CM

## 2019-01-16 LAB — COMPREHENSIVE METABOLIC PANEL
ALT: 7 U/L (ref 0–44)
AST: 20 U/L (ref 15–41)
Albumin: 3.7 g/dL (ref 3.5–5.0)
Alkaline Phosphatase: 116 U/L (ref 38–126)
Anion gap: 11 (ref 5–15)
BUN: 18 mg/dL (ref 8–23)
CO2: 29 mmol/L (ref 22–32)
Calcium: 9.4 mg/dL (ref 8.9–10.3)
Chloride: 102 mmol/L (ref 98–111)
Creatinine, Ser: 0.89 mg/dL (ref 0.44–1.00)
GFR calc Af Amer: >60 mL/min (ref >60)
GFR calc non Af Amer: >60 mL/min (ref >60)
Glucose, Bld: 65 mg/dL — ABNORMAL LOW (ref 70–99)
Potassium: 4 mmol/L (ref 3.5–5.1)
Sodium: 142 mmol/L (ref 135–145)
Total Bilirubin: 0.3 mg/dL (ref 0.3–1.2)
Total Protein: 8 g/dL (ref 6.5–8.1)

## 2019-01-16 LAB — CBC WITH DIFFERENTIAL/PLATELET
Abs Immature Granulocytes: 0.01 10*3/uL (ref 0.00–0.07)
Basophils Absolute: 0 10*3/uL (ref 0.0–0.1)
Basophils Relative: 1 %
Eosinophils Absolute: 0.2 10*3/uL (ref 0.0–0.5)
Eosinophils Relative: 2 %
HCT: 38.8 % (ref 36.0–46.0)
Hemoglobin: 11.7 g/dL — ABNORMAL LOW (ref 12.0–15.0)
Immature Granulocytes: 0 %
Lymphocytes Relative: 17 %
Lymphs Abs: 1.2 10*3/uL (ref 0.7–4.0)
MCH: 26.6 pg (ref 26.0–34.0)
MCHC: 30.2 g/dL (ref 30.0–36.0)
MCV: 88.2 fL (ref 80.0–100.0)
Monocytes Absolute: 0.6 10*3/uL (ref 0.1–1.0)
Monocytes Relative: 9 %
Neutro Abs: 5 10*3/uL (ref 1.7–7.7)
Neutrophils Relative %: 71 %
Platelets: 245 10*3/uL (ref 150–400)
RBC: 4.4 MIL/uL (ref 3.87–5.11)
RDW: 15.4 % (ref 11.5–15.5)
WBC: 7.1 10*3/uL (ref 4.0–10.5)
nRBC: 0 % (ref 0.0–0.2)

## 2019-01-16 MED ORDER — PROCHLORPERAZINE MALEATE 10 MG PO TABS
ORAL_TABLET | ORAL | Status: AC
  Filled 2019-01-16: qty 1

## 2019-01-16 MED ORDER — HEPARIN SOD (PORK) LOCK FLUSH 100 UNIT/ML IV SOLN
500.0000 [IU] | Freq: Once | INTRAVENOUS | Status: AC | PRN
  Administered 2019-01-16: 500 [IU]
  Filled 2019-01-16: qty 5

## 2019-01-16 MED ORDER — SODIUM CHLORIDE 0.9% FLUSH
10.0000 mL | Freq: Once | INTRAVENOUS | Status: AC
  Administered 2019-01-16: 10 mL
  Filled 2019-01-16: qty 10

## 2019-01-16 MED ORDER — TRASTUZUMAB CHEMO 150 MG IV SOLR
6.0000 mg/kg | Freq: Once | INTRAVENOUS | Status: AC
  Administered 2019-01-16: 525 mg via INTRAVENOUS
  Filled 2019-01-16: qty 25

## 2019-01-16 MED ORDER — PROCHLORPERAZINE MALEATE 10 MG PO TABS
10.0000 mg | ORAL_TABLET | Freq: Once | ORAL | Status: AC
  Administered 2019-01-16: 10 mg via ORAL

## 2019-01-16 MED ORDER — PACLITAXEL PROTEIN-BOUND CHEMO INJECTION 100 MG
100.0000 mg/m2 | Freq: Once | INTRAVENOUS | Status: AC
  Administered 2019-01-16: 200 mg via INTRAVENOUS
  Filled 2019-01-16: qty 40

## 2019-01-16 MED ORDER — SODIUM CHLORIDE 0.9 % IV SOLN
420.0000 mg | Freq: Once | INTRAVENOUS | Status: AC
  Administered 2019-01-16: 420 mg via INTRAVENOUS
  Filled 2019-01-16: qty 14

## 2019-01-16 MED ORDER — SODIUM CHLORIDE 0.9% FLUSH
10.0000 mL | INTRAVENOUS | Status: DC | PRN
  Administered 2019-01-16: 10 mL
  Filled 2019-01-16: qty 10

## 2019-01-16 MED ORDER — DIPHENHYDRAMINE HCL 25 MG PO CAPS
25.0000 mg | ORAL_CAPSULE | Freq: Once | ORAL | Status: AC
  Administered 2019-01-16: 25 mg via ORAL

## 2019-01-16 MED ORDER — DIPHENHYDRAMINE HCL 25 MG PO CAPS
ORAL_CAPSULE | ORAL | Status: AC
  Filled 2019-01-16: qty 1

## 2019-01-16 MED ORDER — ACETAMINOPHEN 325 MG PO TABS
650.0000 mg | ORAL_TABLET | Freq: Once | ORAL | Status: AC
  Administered 2019-01-16: 650 mg via ORAL

## 2019-01-16 MED ORDER — ACETAMINOPHEN 325 MG PO TABS
ORAL_TABLET | ORAL | Status: AC
  Filled 2019-01-16: qty 2

## 2019-01-16 MED ORDER — SODIUM CHLORIDE 0.9 % IV SOLN
Freq: Once | INTRAVENOUS | Status: AC
  Administered 2019-01-16: 12:00:00 via INTRAVENOUS
  Filled 2019-01-16: qty 250

## 2019-01-16 MED ORDER — TRASTUZUMAB CHEMO 150 MG IV SOLR: 6.0000 mg/kg | Freq: Once | INTRAVENOUS | Status: DC

## 2019-01-16 NOTE — Patient Instructions (Signed)
[facility] Discharge Instructions for Patients Receiving Chemotherapy  Today you received the following chemotherapy agents Herceptin, Perjeta, Abraxane.  To help prevent nausea and vomiting after your treatment, we encourage you to take your nausea medication as directed.  If you develop nausea and vomiting that is not controlled by your nausea medication, call the clinic.   BELOW ARE SYMPTOMS THAT SHOULD BE REPORTED IMMEDIATELY:  *FEVER GREATER THAN 100.5 F  *CHILLS WITH OR WITHOUT FEVER  NAUSEA AND VOMITING THAT IS NOT CONTROLLED WITH YOUR NAUSEA MEDICATION  *UNUSUAL SHORTNESS OF BREATH  *UNUSUAL BRUISING OR BLEEDING  TENDERNESS IN MOUTH AND THROAT WITH OR WITHOUT PRESENCE OF ULCERS  *URINARY PROBLEMS  *BOWEL PROBLEMS  UNUSUAL RASH Items with * indicate a potential emergency and should be followed up as soon as possible.  Feel free to call the clinic should you have any questions or concerns. The clinic phone number is [phone].  Please show the CHEMO ALERT CARD at check-in to the Emergency Department and triage nurse.   

## 2019-01-16 NOTE — Progress Notes (Signed)
[facility]  [phone] [phone]     ID: [name]   DOB: Mar 31, 1956  MR#: [id number]  QGB#:[id number]  Patient Care Team: Tawny Asal as PCP - General (Physician Assistant) [name], [name], MD as Consulting Physician (Oncology) [name], MD as Consulting Physician (Radiation Oncology) [name], [name], MD as Consulting Physician (Cardiology) [name], [name] 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   The patient is now back on Abraxane which she is tolerating well.  The patient also continues on trastuzumab and pertuzumab, which she is tolerating well.  She is not having peripheral neuropathy.  Her pain is controlled with her current regimen.  She says she is due for her echo, and wants to know when it is going to be.  [name] says she feels like her tumors are getting smaller.      REVIEW OF SYSTEMS: [name] is feeling well. She denies any new issues today.  She hasn't had any unusual headaches, vision changes.  She is without chest pain, palpitations, cough, shortness of breath.  She hasn't had nausea, vomiting, bowel/bladder changes.  A detailed ROS was otherwise non contributory.     BREAST CANCER HISTORY: From the original intake note:  The patient originally presented in May 2003 when she noticed a lump in the upper inner quadrant of her right breast in September 2003.  She sought attention and had a mammogram which showed an obvious carcinoma in the upper inner quadrant of the right breast, approximately 2 cm.  There were some enlarged lymph nodes in the axilla and an FNA done showed those consistent with  malignant cells, most likely an invasive ductal carcinoma.  At that point she was unsure of what to do and was referred by Dr. [name] for a discussion of her treatment options.  By biopsy, it was ER/PR negative and HER-2 was 3+.  DNA index was 1.42.  We reiterated that it was most important to have her disease surgically addressed at that point and had recommended lumpectomy and axillary nodes to be addressed with which she followed through and on 04-03-02, Dr. [name] performed a right partial mastectomy and a right axillary lymph node dissection.  Final pathology revealed a 2.4 cm. high grade, Grade III invasive ductal carcinoma with an adjacent .8 cm. also high grade invasive ductal carcinoma which was felt to represent an intramammary metastasis rather than a second primary.  A smaller mass was just medial to the larger mass.  The smaller mass was associated with high grade DCIS component.  There was no definite lymphovascular invasion identified.  However, one of fourteen axillary lymph nodes did contain a 1.5 cm. metastatic deposit.  Postoperatively she did very well.  We reiterated the fact that she did need adjuvant chemotherapy, however, she refused and decided that she would pursue radiation.  She received radiation and completed that on 08-14-02. Her subsequent history is as detailed below.   PAST MEDICAL HISTORY: Past Medical History:  Diagnosis Date  . Breast cancer ([place])   . DVT (deep venous thrombosis) ([facility]) 10/07/2011  . Fever 02/06/2018  . Hypertension   . Radiation 11/29/2005-12/29/2005   right supraclavicular area 4147 cGy  . Radiation 06/26/02-08/14/02  right breast 5040 cGy, tumor bed boosted to 1260 cGy  . Rheumatic fever     PAST SURGICAL HISTORY: Past Surgical History:  Procedure Laterality Date  . BREAST SURGERY    . MASTECTOMY     bilateral  . PORT A CATH REVISION      FAMILY HISTORY Family History  Problem Relation Age of Onset  . Pancreatic cancer Mother   .  Kidney cancer Sister 58  . Breast cancer Sister 81  . Breast cancer Other 61  The patient's father died in his 02O  from complications of alcohol abuse. The patient's mother died in her 10s from pancreatic cancer. The patient has 6 brothers, 2 sisters. Both her sisters have had breast cancer, both diagnosed after the age of 41. The patient has not had genetic testing so far.   GYNECOLOGIC HISTORY: Menarche age 32; first live birth age 70. She is GXP4. She is not sure when she stopped having periods. She never used hormone replacement.  SOCIAL HISTORY: The patient has worked in the past as a Psychologist, counselling. At home in addition to the patient are her husband [name], originally from [place], who works as a Administrator.  Also living in the home is her daughter [name] (she is studying to be a Marine scientist).  The other 2 children are [name], who has a college degree in psychology and works as a Probation officer; and [name], who lives in [place] and works as a Higher education careers adviser. [name] also sometimes stays in the home. In addition the patient has an aide who helps her almost daily.   ADVANCED DIRECTIVES: Not in place  HEALTH MAINTENANCE: Social History   Tobacco Use  . Smoking status: Light Tobacco Smoker  . Smokeless tobacco: Never Used  Substance Use Topics  . Alcohol use: Yes    Comment: Rarely  . Drug use: No     Colonoscopy:  Not on file  PAP:  Not on file  Bone density:  Not on file  Lipid panel:  Not on file    Allergies  Allergen Reactions  . Pork-Derived Products Other (See Comments)    Pt says she just doesn't eat pork; has no reaction to pork products  . Tramadol Nausea Only  . Hydrocodone-Acetaminophen Itching and Rash    Current Outpatient Medications  Medication Sig Dispense Refill  . ALPRAZolam (XANAX) 0.5 MG tablet Take 1 tablet (0.5 mg total) by mouth 2 (two) times daily as needed for anxiety. 45 tablet 0  . Ascorbic Acid (VITAMIN C) 100 MG tablet Take 100 mg by mouth  daily.      . cholecalciferol (VITAMIN D) 1000 units tablet Take 1 tablet (1,000 Units total) by mouth daily. 90 tablet 4  . gabapentin (NEURONTIN) 300 MG capsule TAKE 1 CAPSULE (300 MG TOTAL) BY MOUTH 2 (TWO) TIMES DAILY AS NEEDED. 60 capsule 1  . hydrOXYzine (ATARAX/VISTARIL) 25 MG tablet TAKE 1 TABLET BY MOUTH THREE TIMES A DAY AS NEEDED 270 tablet 1  . magic mouthwash SOLN SWISH AND SPIT OR SWALLOW WITH 5MLS BEFORE MEALS AND AT BEDTIME 240 mL 1  . methadone (DOLOPHINE) 10 MG tablet Take 10 mg by mouth 5 (five) times daily.  0  . Multiple Vitamin (MULTIVITAMIN) capsule Take 1 capsule by mouth daily.      . Oxycodone HCl 20 MG TABS Take 15 mg by mouth 4 (four) times daily.   0  . potassium chloride (MICRO-K) 10 MEQ CR capsule Take 2 capsules (20 mEq total)  by mouth daily. 60 capsule 2  . prochlorperazine (COMPAZINE) 10 MG tablet Take 1 tablet (10 mg total) by mouth every 6 (six) hours as needed for nausea or vomiting. 30 tablet 0  . triamterene-hydrochlorothiazide (DYAZIDE) 37.5-25 MG capsule Take 1 each (1 capsule total) by mouth every morning. 30 capsule 0   No current facility-administered medications for this visit.    Facility-Administered Medications Ordered in Other Visits  Medication Dose Route Frequency Provider Last Rate Last Dose  . heparin lock flush 100 unit/mL  500 Units Intracatheter Once PRN [name], [name], MD      . sodium chloride 0.9 % injection 10 mL  10 mL Intravenous PRN [name], [name], MD   10 mL at 03/04/14 1421  . sodium chloride flush (NS) 0.9 % injection 10 mL  10 mL Intracatheter PRN [name], [name], MD        OBJECTIVE:  Vitals:   01/16/19 1057  BP: 140/84  Pulse: 63  Resp: 17  Temp: 98.2 F (36.8 C)  SpO2: 99%     Body mass index is 33.27 kg/m.      ECOG FS: 1 GENERAL: Patient is a well appearing female in no acute distress HEENT:  Sclerae anicteric.  Oropharynx clear and moist. No ulcerations or evidence of oropharyngeal candidiasis. Neck  is supple.  NODES:  No cervical, supraclavicular, or axillary lymphadenopathy palpated.  BREAST EXAM:  Deferred. LUNGS:  Clear to auscultation bilaterally.  No wheezes or rhonchi. HEART:  Regular rate and rhythm. No murmur appreciated. ABDOMEN:  Soft, nontender.  Positive, normoactive bowel sounds. No organomegaly palpated. MSK:  No focal spinal tenderness to palpation. Full range of motion bilaterally in the upper extremities. EXTREMITIES:  No peripheral edema.   SKIN:  Clear with no obvious rashes or skin changes. No nail dyscrasia. See picture below for appearance of her back. NEURO:  Nonfocal. Well oriented.  Appropriate affect.      Photo 07/04/2017     Photo 08/23/2018   Photo 12/19/2018     Photo 01/16/2019    LAB RESULTS:.  Lab Results  Component Value Date   WBC 7.1 01/16/2019   NEUTROABS 5.0 01/16/2019   HGB 11.7 (L) 01/16/2019   HCT 38.8 01/16/2019   MCV 88.2 01/16/2019   PLT 245 01/16/2019      Chemistry      Component Value Date/Time   NA 142 01/16/2019 1002   NA 142 11/09/2017 1214   K 4.0 01/16/2019 1002   K 3.9 11/09/2017 1214   CL 102 01/16/2019 1002   CL 101 03/07/2013 1411   CO2 29 01/16/2019 1002   CO2 32 (H) 11/09/2017 1214   BUN 18 01/16/2019 1002   BUN 19.1 11/09/2017 1214   CREATININE 0.89 01/16/2019 1002   CREATININE 1.04 (H) 05/24/2018 0848   CREATININE 1.0 11/09/2017 1214      Component Value Date/Time   CALCIUM 9.4 01/16/2019 1002   CALCIUM 9.1 11/09/2017 1214   ALKPHOS 116 01/16/2019 1002   ALKPHOS 120 11/09/2017 1214   AST 20 01/16/2019 1002   AST 17 05/24/2018 0848   AST 30 11/09/2017 1214   ALT 7 01/16/2019 1002   ALT 11 05/24/2018 0848   ALT 19 11/09/2017 1214   BILITOT 0.3 01/16/2019 1002   BILITOT 0.4 05/24/2018 0848   BILITOT <0.22 11/09/2017 1214       STUDIES: No results found.   ASSESSMENT:  63 y.o. [name] woman with stage IV breast cancer manifested chiefly  by loco-regional nodal disease  (neck, chest) and skin involvement, without liver, bone, or brain metastases documented   (1) Status post right upper inner quadrant lumpectomy and sentinel lymph node sampling 03/04/2002 for 2 separate foci of invasive ductal carcinoma, mpT2 pN1 or stage IIB, both foci grade 3, both estrogen and progesterone receptor positive, both HercepTest 3+, Mib-1 56%  (2) Reexcision for margins 05/27/2002 showed no residual cancer in the breast.  (3) The patient refused adjuvant systemic therapy.  (4) Adjuvant radiation treatment completed 08/14/2002.  (5) recurrence in the right breast in 02/2004 showing a morphologically different tumor, again grade 3, again estrogen and progesterone receptor negative, with an MIB-1 of 14% and Herceptest 3+.  (5) Between 03/2004 and 07/2004 she received dose dense Doxorubicin/Cyclophosphamide x 4 given with trastuzumab, followed by weekly Docetaxel x 8, again given with trastuzumab.  (6) Right mastectomy 07/13/2004 showed scattered microscopic foci of residual disease over an area  greater than 5 cm. Margins were negative.  (7) Postoperative Docetaxel continued until 09/2004.  (8) Trastuzumab (Herceptin) given 08/2004 through 01/2012 with some brief interruptions.  RECURRENT/ STAGE IV DISEASE December 2006 (9) Isolated right cervical nodal recurrence 10/2005, treated with radiation to the right supraclavicular area (total 41.5 gray) completed 12/29/2005.  (10) Navelbine given together with Herceptin  11/2005 through 03/2006.  (9) Left mastectomy 02/13/2006 for ductal carcinoma in situ, grade 2, estrogen and progesterone receptor negative, with negative margins; 0 of 3 lymph nodes involved  (10) treated with Lapatinib and Capecitabine before 10/2009, for an unclear duration and with unclear results (cannot locate data on chart review).  (11) Status post right supraclavicular lymph node biopsy 09/2010 again positive for an invasive ductal carcinoma, estrogen and  progesterone receptor negative, HER-2 positive by C[facility] with a ratio 4.25.  (12) Navelbine given together with Herceptin between 05/2011 and 11/2011.  (13) Carboplatin/ Gemcitabine/ Herceptin given for 2 cycles, in 12/2011 and 01/2012.  (14) TDM-1 ([place]) started 02/2012. Last dose 10/02/2013 after which the patient discontinued treatment at her own discretion. Echo on December 2014 showed a well preserved ejection fraction.  (15) Deep vein thrombosis of the right upper extremity documented 04/20/2011.  She completed anticoagulant therapy with Coumadin on 03/25/2013.  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 Left chest Port-A-Cath placed on 04/08/2013; being flushed every 6 weeks  RIGHT UPPER EXTREMITY ANGIOSARCOMA VS BREAST CANCER: August 2016 (18) treated at [facility] of [place] August 2016 with paclitaxel, trastuzumab and pertuzumab 1 cycle, afterwards referred to hospice  (19) under the care of hospice of [facility] August 2016 to 05/08/2016, when the patient opted for a second try at chemotherapy  (20) started low-dose Abraxane, trastuzumab and pertuzumab 06/07/2016, to be repeated every 4 weeks.   (a) pretreatment echocardiogram 06/06/2016 showed a 60-65% ejection fraction  (b) significant compliance problems compromise response to treatment  (c)  echocardiogram 02/20/17 LVEF 55-60%  (d) Abraxane discontinued 07/04/2017 because of possible neuropathy symptoms  (e) Abraxane resumed 09/27/2017  (f) echocardiogram 07/20/2017 showed an ejection fraction of 60-65%  (g) echo 01/22/2018 showed an ejection fraction of 50-55%  (h) CT scan of the chest 05/03/2018 shows no disease involving the lungs liver or lymph nodes, with stable subcutaneous  masses as previously noted  (I) Abraxane discontinued August 2019 because of concerns regarding  neuropathy-- gemcitabine substituted  (j) echocardiogram 08/20/2018 shows an ejection fraction in the 55-60% range (done every 6 months)  (k) gemcitabine discontinued because of multiple symptoms, Abraxane restarted 09/11/2018, given once every 4 weeks  (21) Chronic pain secondary to known metastatic disease  (a) patient signed pain contract 10/19/2016  (b) as of 11/16/2016 receives her pain medications from Dr [name]  (c)  Dr [name] no longer able to prescribe as of February 2019  (d) all pain medications now through [facility] (as of April 2019)   PLAN: Norene is doing moderately well today.  She continues to tolerate her treatment well and her tumors are stable.  She will continue to receive Abraxane, Trastuzumab and Pertuzumab and will undergo this today.  It is good that she hasn't developed any neuropathy.    Clayton's echocardiogram was ordered last month.  I have asked my nurse Cecille Rubin to schedule it for me today.    Artia's pain is under control.  We are not managing it, but I did review her med history in PMP aware which appears to be consistent.    I went ahead and ordered CT chest to evaluate for recurrence.  Rafia says she would like a PET scan, however, I explained that we need to do this first.  She is agreeable.  Anelly will return every 4 weeks for labs, evaluation by myself or Dr. Jana Hakim, and Abraxane, Trastuzumab, and Pertuzumab.   She knows to call for any questions or concerns prior to her next appointment with [name].    A total of (20) minutes of face-to-face time was spent with this patient with greater than 50% of that time in counseling and care-coordination.  Wilber Bihari, NP  01/16/19 2:51 PM Medical Oncology and Hematology [facility] [address] [place], [place] [address] Tel. [phone]    Fax.  [phone]

## 2019-01-16 NOTE — Progress Notes (Signed)
[id number]

## 2019-01-22 ENCOUNTER — Other Ambulatory Visit: Payer: Self-pay | Admitting: Oncology

## 2019-01-23 ENCOUNTER — Other Ambulatory Visit: Payer: Self-pay | Admitting: Oncology

## 2019-01-23 DIAGNOSIS — G629 Polyneuropathy, unspecified: Secondary | ICD-10-CM

## 2019-01-23 DIAGNOSIS — I10 Essential (primary) hypertension: Secondary | ICD-10-CM

## 2019-01-30 ENCOUNTER — Other Ambulatory Visit: Payer: Medicare Other

## 2019-01-30 ENCOUNTER — Ambulatory Visit: Payer: Medicare Other | Admitting: Adult Health

## 2019-01-30 ENCOUNTER — Ambulatory Visit: Payer: Medicare Other

## 2019-02-11 ENCOUNTER — Ambulatory Visit (HOSPITAL_COMMUNITY)
Admission: RE | Admit: 2019-02-11 | Discharge: 2019-02-11 | Disposition: A | Discharge status: Home / Self Care | Payer: Medicare Other | Source: Ambulatory Visit | Attending: Adult Health | Admitting: Adult Health

## 2019-02-11 ENCOUNTER — Other Ambulatory Visit: Payer: Self-pay

## 2019-02-11 DIAGNOSIS — I340 Nonrheumatic mitral (valve) insufficiency: Secondary | ICD-10-CM | POA: Insufficient documentation

## 2019-02-11 DIAGNOSIS — Z01818 Encounter for other preprocedural examination: Secondary | ICD-10-CM | POA: Diagnosis not present

## 2019-02-11 DIAGNOSIS — C50211 Malignant neoplasm of upper-inner quadrant of right female breast: Secondary | ICD-10-CM | POA: Diagnosis present

## 2019-02-11 DIAGNOSIS — I10 Essential (primary) hypertension: Secondary | ICD-10-CM | POA: Diagnosis not present

## 2019-02-11 DIAGNOSIS — Z170 Estrogen receptor positive status [ER+]: Secondary | ICD-10-CM | POA: Diagnosis not present

## 2019-02-11 NOTE — Progress Notes (Signed)
  Echocardiogram 2D Echocardiogram has been performed.  Jennette Dubin 02/11/2019, 12:16 PM

## 2019-02-12 NOTE — Progress Notes (Signed)
[place]  Telephone:(336) [phone] Fax:(336) [phone]     ID: [name]   DOB: [birth year]  MR#: [id number]  BMW#:[id number]  Patient Care Team: Tawny Asal as PCP - General (Physician Assistant) Kayliah Tindol, [name], MD as Consulting Physician (Oncology) [name], MD as Consulting Physician (Radiation Oncology) [name], [name], MD as Consulting Physician (Cardiology) [name], [name] 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   The patient is now back on Abraxane which she is tolerating well.  She receives this every 28 days.  The patient also continues on every 28-day trastuzumab and pertuzumab, which she is tolerating well.    Since her last visit, she underwent echocardiogram on 02/11/2019, which showed an ejection fraction of 55-60%. This is stable since her previous echo on 08/20/2018.   REVIEW OF SYSTEMS: [name] is lying down on the exam table today. She reports she has been short of breath frequently, had trouble walking, gained weight, stays hungry, and been sleeping a lot during the day. She notes she does not become nauseous as often anymore. She states the recent curfew has left her unable to go walking with her grandson. Her husband is currently on the road (he is a Administrator). She states her friend, Peter Congo, died last month Feb 22, 2019), and that Peter Congo was in a lot of pain. She visited [facility] on 12/24/2018 for her chronic pain. The patient denies unusual headaches, visual changes, nausea, vomiting, stiff neck, dizziness, or gait imbalance. There has been no cough, phlegm production, or pleurisy, no chest pain  or pressure, and no change in bowel or bladder habits. The patient denies fever, rash, bleeding, unexplained fatigue or unexplained weight loss. A detailed review of systems was otherwise entirely negative.   BREAST CANCER HISTORY: From the original intake note:  The patient originally presented in May 2003 when she noticed a lump in the upper inner quadrant of her right breast in September 2003.  She sought attention and had a mammogram which showed an obvious carcinoma in the upper inner quadrant of the right breast, approximately 2 cm.  There were some enlarged lymph nodes in the axilla and an FNA done showed those consistent with malignant cells, most likely an invasive ductal carcinoma.  At that point she was unsure of what to do and was referred by Dr. [name] for a discussion of her treatment options.  By biopsy, it was ER/PR negative and HER-2 was 3+.  DNA index was 1.42.  We reiterated that it was most important to have her disease surgically addressed at that point and had recommended lumpectomy and axillary nodes to be addressed with which she followed through and on 04-03-02, Dr. [name] performed a right partial mastectomy and a right axillary lymph node dissection.  Final pathology revealed a 2.4 cm. high grade, Grade III invasive ductal carcinoma with an adjacent .8 cm. also high grade invasive ductal carcinoma which was felt to represent an intramammary metastasis rather than a second primary.  A smaller mass was just medial to the larger mass.  The smaller mass was associated with high grade DCIS component.  There was no definite lymphovascular invasion identified.  However, one of fourteen axillary lymph nodes  did contain a 1.5 cm. metastatic deposit.  Postoperatively she did very well.  We reiterated the fact that she did need adjuvant chemotherapy, however, she refused and decided that she would pursue radiation.  She received radiation and completed that on 08-14-02. Her subsequent  history is as detailed below.   PAST MEDICAL HISTORY: Past Medical History:  Diagnosis Date  . Breast cancer ([place])   . DVT (deep venous thrombosis) ([place]) 10/07/2011  . Fever 02/06/2018  . Hypertension   . Radiation 11/29/2005-12/29/2005   right supraclavicular area 4147 cGy  . Radiation 06/26/02-08/14/02   right breast 5040 cGy, tumor bed boosted to 1260 cGy  . Rheumatic fever     PAST SURGICAL HISTORY: Past Surgical History:  Procedure Laterality Date  . BREAST SURGERY    . MASTECTOMY     bilateral  . PORT A CATH REVISION      FAMILY HISTORY Family History  Problem Relation Age of Onset  . Pancreatic cancer Mother   . Kidney cancer Sister 49  . Breast cancer Sister 39  . Breast cancer Other 29  The patient's father died in his 64S  from complications of alcohol abuse. The patient's mother died in her 64s from pancreatic cancer. The patient has 6 brothers, 2 sisters. Both her sisters have had breast cancer, both diagnosed after the age of 63. The patient has not had genetic testing so far.   GYNECOLOGIC HISTORY: Menarche age 77; first live birth age 79. She is GXP4. She is not sure when she stopped having periods. She never used hormone replacement.  SOCIAL HISTORY: The patient has worked in the past as a Psychologist, counselling. At home in addition to the patient are her husband [name], originally from [place], who works as a Administrator.  Also living in the home is her daughter [name] (she is studying to be a Marine scientist) and grandson.  The other 2 children are [name], who has a college degree in psychology and works as a Probation officer; and [name], who lives in [place] and works as a Higher education careers adviser. Son [name] also sometimes stays in the home. In addition the patient has an aide who helps her almost daily.   ADVANCED DIRECTIVES: Not in place  HEALTH MAINTENANCE: Social History   Tobacco Use  . Smoking status: Light Tobacco Smoker  . Smokeless tobacco: Never Used  Substance Use Topics   . Alcohol use: Yes    Comment: Rarely  . Drug use: No     Colonoscopy:  Not on file  PAP:  Not on file  Bone density:  Not on file  Lipid panel:  Not on file    Allergies  Allergen Reactions  . Pork-Derived Products Other (See Comments)    Pt says she just doesn't eat pork; has no reaction to pork products  . Tramadol Nausea Only  . Hydrocodone-Acetaminophen Itching and Rash    Current Outpatient Medications  Medication Sig Dispense Refill  . ALPRAZolam (XANAX) 0.5 MG tablet Take 1 tablet (0.5 mg total) by mouth 2 (two) times daily as needed for anxiety. 45 tablet 0  . Ascorbic Acid (VITAMIN C) 100 MG tablet Take 100 mg by mouth daily.      . cholecalciferol (VITAMIN D) 1000 units tablet Take 1 tablet (1,000 Units total) by mouth daily. 90 tablet 4  . gabapentin (NEURONTIN) 300 MG capsule TAKE ONE CAPSULE BY MOUTH TWICE DAILY AS NEEDED 60 capsule 0  . hydrOXYzine (ATARAX/VISTARIL) 25 MG tablet TAKE 1 TABLET BY MOUTH  THREE TIMES A DAY AS NEEDED 270 tablet 1  . magic mouthwash SOLN SWISH AND SPIT OR SWALLOW WITH 5MLS BEFORE MEALS AND AT BEDTIME 240 mL 1  . methadone (DOLOPHINE) 10 MG tablet Take 10 mg by mouth 5 (five) times daily.  0  . Multiple Vitamin (MULTIVITAMIN) capsule Take 1 capsule by mouth daily.      . Oxycodone HCl 20 MG TABS Take 15 mg by mouth 4 (four) times daily.   0  . potassium chloride (MICRO-K) 10 MEQ CR capsule TAKE TWO CAPSULES BY MOUTH EVERY DAY 60 capsule 1  . prochlorperazine (COMPAZINE) 10 MG tablet Take 1 tablet (10 mg total) by mouth every 6 (six) hours as needed for nausea or vomiting. 30 tablet 0  . triamterene-hydrochlorothiazide (DYAZIDE) 37.5-25 MG capsule Take 1 each (1 capsule total) by mouth every morning. 30 capsule 0   No current facility-administered medications for this visit.    Facility-Administered Medications Ordered in Other Visits  Medication Dose Route Frequency Provider Last Rate Last Dose  . sodium chloride 0.9 % injection 10 mL   10 mL Intravenous PRN Breland Elders, [name], MD   10 mL at 03/04/14 1421    OBJECTIVE: Middle-aged African-American woman who appears stated age 12:   02/13/19 1053  BP: (!) 112/92  Pulse: 82  Resp: 18  Temp: 98.9 F (37.2 C)  SpO2: 98%     Body mass index is 34.13 kg/m.      ECOG FS: 1  Sclerae unicteric, EOMs intact Wearing a mask No cervical or supraclavicular adenopathy Lungs no rales or rhonchi Heart regular rate and rhythm Abd soft, nontender, positive bowel sounds MSK the right upper extremity lymphedema and the lesions in the right arm and upper back on the right side are stable to improved Neuro: nonfocal, well oriented, appropriate affect Breasts: Status post bilateral mastectomies.      Photo 07/04/2017     Photo 08/23/2018   Photo 12/19/2018     Photo 01/16/2019    LAB RESULTS:.  Lab Results  Component Value Date   WBC 6.2 02/13/2019   NEUTROABS 4.1 02/13/2019   HGB 11.4 (L) 02/13/2019   HCT 37.7 02/13/2019   MCV 88.5 02/13/2019   PLT 248 02/13/2019      Chemistry      Component Value Date/Time   NA 140 02/13/2019 1021   NA 142 11/09/2017 1214   K 4.2 02/13/2019 1021   K 3.9 11/09/2017 1214   CL 100 02/13/2019 1021   CL 101 03/07/2013 1411   CO2 31 02/13/2019 1021   CO2 32 (H) 11/09/2017 1214   BUN 12 02/13/2019 1021   BUN 19.1 11/09/2017 1214   CREATININE 1.06 (H) 02/13/2019 1021   CREATININE 1.04 (H) 05/24/2018 0848   CREATININE 1.0 11/09/2017 1214      Component Value Date/Time   CALCIUM 9.5 02/13/2019 1021   CALCIUM 9.1 11/09/2017 1214   ALKPHOS 119 02/13/2019 1021   ALKPHOS 120 11/09/2017 1214   AST 22 02/13/2019 1021   AST 17 05/24/2018 0848   AST 30 11/09/2017 1214   ALT 13 02/13/2019 1021   ALT 11 05/24/2018 0848   ALT 19 11/09/2017 1214   BILITOT <0.2 (L) 02/13/2019 1021   BILITOT 0.4 05/24/2018 0848   BILITOT <0.22 11/09/2017 1214       STUDIES: No results found.   ASSESSMENT:  63 y.o. [place]  woman with stage IV breast cancer manifested chiefly by loco-regional nodal disease (neck, chest)  and skin involvement, without liver, bone, or brain metastases documented   (1) Status post right upper inner quadrant lumpectomy and sentinel lymph node sampling 03/04/2002 for 2 separate foci of invasive ductal carcinoma, mpT2 pN1 or stage IIB, both foci grade 3, both estrogen and progesterone receptor positive, both HercepTest 3+, Mib-1 56%  (2) Reexcision for margins 05/27/2002 showed no residual cancer in the breast.  (3) The patient refused adjuvant systemic therapy.  (4) Adjuvant radiation treatment completed 08/14/2002.  (5) recurrence in the right breast in 02/2004 showing a morphologically different tumor, again grade 3, again estrogen and progesterone receptor negative, with an MIB-1 of 14% and Herceptest 3+.  (5) Between 03/2004 and 07/2004 she received dose dense Doxorubicin/Cyclophosphamide x 4 given with trastuzumab, followed by weekly Docetaxel x 8, again given with trastuzumab.  (6) Right mastectomy 07/13/2004 showed scattered microscopic foci of residual disease over an area  greater than 5 cm. Margins were negative.  (7) Postoperative Docetaxel continued until 09/2004.  (8) Trastuzumab (Herceptin) given 08/2004 through 01/2012 with some brief interruptions.  RECURRENT/ STAGE IV DISEASE December 2006 (9) Isolated right cervical nodal recurrence 10/2005, treated with radiation to the right supraclavicular area (total 41.5 gray) completed 12/29/2005.  (10) Navelbine given together with Herceptin  11/2005 through 03/2006.  (9) Left mastectomy 02/13/2006 for ductal carcinoma in situ, grade 2, estrogen and progesterone receptor negative, with negative margins; 0 of 3 lymph nodes involved  (10) treated with Lapatinib and Capecitabine before 10/2009, for an unclear duration and with unclear results (cannot locate data on chart review).  (11) Status post right supraclavicular lymph  node biopsy 09/2010 again positive for an invasive ductal carcinoma, estrogen and progesterone receptor negative, HER-2 positive by CISH with a ratio 4.25.  (12) Navelbine given together with Herceptin between 05/2011 and 11/2011.  (13) Carboplatin/ Gemcitabine/ Herceptin given for 2 cycles, in 12/2011 and 01/2012.  (14) TDM-1 ([place]) started 02/2012. Last dose 10/02/2013 after which the patient discontinued treatment at her own discretion. Echo on December 2014 showed a well preserved ejection fraction.  (15) Deep vein thrombosis of the right upper extremity documented 04/20/2011.  She completed anticoagulant therapy with Coumadin on 03/25/2013.  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 Left chest Port-A-Cath placed on 04/08/2013; being flushed every 6 weeks  RIGHT UPPER EXTREMITY ANGIOSARCOMA VS BREAST CANCER: August 2016 (18) treated at [facility] [place] August 2016 with paclitaxel, trastuzumab and pertuzumab 1 cycle, afterwards referred to hospice  (19) under the care of [facility] of [facility] August 2016 to 05/08/2016, when the patient opted for a second try at chemotherapy  (20) started low-dose Abraxane, trastuzumab and pertuzumab 06/07/2016, to be repeated every 4 weeks.   (a) pretreatment echocardiogram 06/06/2016 showed a 60-65% ejection fraction  (b) significant compliance problems compromise response to treatment  (c)  echocardiogram 02/20/17 LVEF 55-60%  (d) Abraxane discontinued 07/04/2017 because of possible neuropathy symptoms  (e) Abraxane resumed 09/27/2017  (f) echocardiogram 07/20/2017 showed an ejection fraction of 60-65%  (g) echo 01/22/2018 showed an ejection fraction of 50-55%  (h) CT scan of the chest 05/03/2018  shows no disease involving the lungs liver or lymph nodes, with stable subcutaneous masses as previously noted  (I) Abraxane discontinued August 2019 because of concerns regarding neuropathy-- gemcitabine substituted  (j) echocardiogram 08/20/2018  shows an ejection fraction in the 55-60% range (done every 6 months)  (k) gemcitabine discontinued because of multiple symptoms, Abraxane restarted 09/11/2018, given once every 4 weeks  (21) Chronic pain secondary to known metastatic disease  (a) patient signed pain contract 10/19/2016  (b) as of 11/16/2016 receives her pain medications from Dr [name]  (c)  Dr [name] no longer able to prescribe as of February 2019  (d) all pain medications now through [facility] (as of April 2019)   PLAN: Jazzalyn continues to be remarkably stable on her current treatment.  She is tolerating the trastuzumab and Pertuzumab well with the recent echocardiogram being very favorable.  She has no diarrhea problems from the epratuzumab.  She also denies any peripheral neuropathy from the paclitaxel, which we are continuing all of this being given every 28 days.  I am changing her treatment days to Tuesdays so that they will not conflict with breast clinic.  I am not planning any restaging studies unless there is evidence of progression on the palpable lesions we follow.  We did discuss nutrition issues today.  She has gained quite a bit of weight and she would like to start losing some.  We also reviewed pandemic precautions  She knows to call for any other issue that may develop before the next visit.   [name]. Cashmere Harmes, MD  02/13/19 11:15 AM Medical Oncology and Hematology [facility] [address] [place], [place] [address] Tel. [phone]    Fax. [phone]   I, Wilburn Mylar, am acting as scribe for Dr. [name]. Esequiel Kleinfelter.   I, Lurline Del MD, have reviewed the above documentation for accuracy and  completeness, and I agree with the above.

## 2019-02-13 ENCOUNTER — Inpatient Hospital Stay (HOSPITAL_BASED_OUTPATIENT_CLINIC_OR_DEPARTMENT_OTHER): Payer: Medicare Other | Admitting: Oncology

## 2019-02-13 ENCOUNTER — Inpatient Hospital Stay: Payer: Medicare Other

## 2019-02-13 ENCOUNTER — Other Ambulatory Visit: Payer: Self-pay

## 2019-02-13 ENCOUNTER — Inpatient Hospital Stay: Payer: Medicare Other | Attending: Oncology

## 2019-02-13 VITALS — BP 112/92 | HR 82 | Temp 98.9°F | Resp 18 | Ht 65.0 in | Wt 205.1 lb

## 2019-02-13 DIAGNOSIS — G893 Neoplasm related pain (acute) (chronic): Secondary | ICD-10-CM | POA: Insufficient documentation

## 2019-02-13 DIAGNOSIS — Z86718 Personal history of other venous thrombosis and embolism: Secondary | ICD-10-CM

## 2019-02-13 DIAGNOSIS — Z170 Estrogen receptor positive status [ER+]: Secondary | ICD-10-CM

## 2019-02-13 DIAGNOSIS — I10 Essential (primary) hypertension: Secondary | ICD-10-CM | POA: Insufficient documentation

## 2019-02-13 DIAGNOSIS — R0602 Shortness of breath: Secondary | ICD-10-CM | POA: Insufficient documentation

## 2019-02-13 DIAGNOSIS — Z9013 Acquired absence of bilateral breasts and nipples: Secondary | ICD-10-CM | POA: Insufficient documentation

## 2019-02-13 DIAGNOSIS — C7931 Secondary malignant neoplasm of brain: Secondary | ICD-10-CM | POA: Insufficient documentation

## 2019-02-13 DIAGNOSIS — C4911 Malignant neoplasm of connective and soft tissue of right upper limb, including shoulder: Secondary | ICD-10-CM

## 2019-02-13 DIAGNOSIS — C792 Secondary malignant neoplasm of skin: Secondary | ICD-10-CM | POA: Insufficient documentation

## 2019-02-13 DIAGNOSIS — Z5111 Encounter for antineoplastic chemotherapy: Secondary | ICD-10-CM | POA: Diagnosis not present

## 2019-02-13 DIAGNOSIS — C50211 Malignant neoplasm of upper-inner quadrant of right female breast: Secondary | ICD-10-CM

## 2019-02-13 DIAGNOSIS — C770 Secondary and unspecified malignant neoplasm of lymph nodes of head, face and neck: Secondary | ICD-10-CM | POA: Diagnosis not present

## 2019-02-13 DIAGNOSIS — Z5112 Encounter for antineoplastic immunotherapy: Secondary | ICD-10-CM | POA: Diagnosis present

## 2019-02-13 DIAGNOSIS — Z95828 Presence of other vascular implants and grafts: Secondary | ICD-10-CM

## 2019-02-13 DIAGNOSIS — Z79899 Other long term (current) drug therapy: Secondary | ICD-10-CM | POA: Insufficient documentation

## 2019-02-13 LAB — CBC WITH DIFFERENTIAL/PLATELET
Abs Immature Granulocytes: 0.02 10*3/uL (ref 0.00–0.07)
Basophils Absolute: 0.1 10*3/uL (ref 0.0–0.1)
Basophils Relative: 1 %
Eosinophils Absolute: 0.3 10*3/uL (ref 0.0–0.5)
Eosinophils Relative: 4 %
HCT: 37.7 % (ref 36.0–46.0)
Hemoglobin: 11.4 g/dL — ABNORMAL LOW (ref 12.0–15.0)
Immature Granulocytes: 0 %
Lymphocytes Relative: 18 %
Lymphs Abs: 1.1 10*3/uL (ref 0.7–4.0)
MCH: 26.8 pg (ref 26.0–34.0)
MCHC: 30.2 g/dL (ref 30.0–36.0)
MCV: 88.5 fL (ref 80.0–100.0)
Monocytes Absolute: 0.7 10*3/uL (ref 0.1–1.0)
Monocytes Relative: 11 %
Neutro Abs: 4.1 10*3/uL (ref 1.7–7.7)
Neutrophils Relative %: 66 %
Platelets: 248 10*3/uL (ref 150–400)
RBC: 4.26 MIL/uL (ref 3.87–5.11)
RDW: 15.5 % (ref 11.5–15.5)
WBC: 6.2 10*3/uL (ref 4.0–10.5)
nRBC: 0 % (ref 0.0–0.2)

## 2019-02-13 LAB — COMPREHENSIVE METABOLIC PANEL
ALT: 13 U/L (ref 0–44)
AST: 22 U/L (ref 15–41)
Albumin: 3.6 g/dL (ref 3.5–5.0)
Alkaline Phosphatase: 119 U/L (ref 38–126)
Anion gap: 9 (ref 5–15)
BUN: 12 mg/dL (ref 8–23)
CO2: 31 mmol/L (ref 22–32)
Calcium: 9.5 mg/dL (ref 8.9–10.3)
Chloride: 100 mmol/L (ref 98–111)
Creatinine, Ser: 1.06 mg/dL — ABNORMAL HIGH (ref 0.44–1.00)
GFR calc Af Amer: >60 mL/min (ref >60)
GFR calc non Af Amer: 56 mL/min — ABNORMAL LOW (ref >60)
Glucose, Bld: 110 mg/dL — ABNORMAL HIGH (ref 70–99)
Potassium: 4.2 mmol/L (ref 3.5–5.1)
Sodium: 140 mmol/L (ref 135–145)
Total Bilirubin: <0.2 mg/dL — ABNORMAL LOW (ref 0.3–1.2)
Total Protein: 7.9 g/dL (ref 6.5–8.1)

## 2019-02-13 MED ORDER — DIPHENHYDRAMINE HCL 25 MG PO CAPS
ORAL_CAPSULE | ORAL | Status: AC
  Filled 2019-02-13: qty 1

## 2019-02-13 MED ORDER — SODIUM CHLORIDE 0.9 % IV SOLN
Freq: Once | INTRAVENOUS | Status: AC
  Administered 2019-02-13: 12:00:00 via INTRAVENOUS
  Filled 2019-02-13: qty 250

## 2019-02-13 MED ORDER — SODIUM CHLORIDE 0.9% FLUSH
10.0000 mL | INTRAVENOUS | Status: DC | PRN
  Administered 2019-02-13: 10 mL
  Filled 2019-02-13: qty 10

## 2019-02-13 MED ORDER — DIPHENHYDRAMINE HCL 25 MG PO CAPS
25.0000 mg | ORAL_CAPSULE | Freq: Once | ORAL | Status: AC
  Administered 2019-02-13: 25 mg via ORAL

## 2019-02-13 MED ORDER — SODIUM CHLORIDE 0.9 % IV SOLN
420.0000 mg | Freq: Once | INTRAVENOUS | Status: AC
  Administered 2019-02-13: 420 mg via INTRAVENOUS
  Filled 2019-02-13: qty 14

## 2019-02-13 MED ORDER — PROCHLORPERAZINE MALEATE 10 MG PO TABS
10.0000 mg | ORAL_TABLET | Freq: Once | ORAL | Status: AC
  Administered 2019-02-13: 10 mg via ORAL

## 2019-02-13 MED ORDER — PROCHLORPERAZINE MALEATE 10 MG PO TABS
ORAL_TABLET | ORAL | Status: AC
  Filled 2019-02-13: qty 1

## 2019-02-13 MED ORDER — PACLITAXEL PROTEIN-BOUND CHEMO INJECTION 100 MG
100.0000 mg/m2 | Freq: Once | INTRAVENOUS | Status: AC
  Administered 2019-02-13: 200 mg via INTRAVENOUS
  Filled 2019-02-13: qty 40

## 2019-02-13 MED ORDER — ACETAMINOPHEN 325 MG PO TABS
650.0000 mg | ORAL_TABLET | Freq: Once | ORAL | Status: AC
  Administered 2019-02-13: 650 mg via ORAL

## 2019-02-13 MED ORDER — HEPARIN SOD (PORK) LOCK FLUSH 100 UNIT/ML IV SOLN
500.0000 [IU] | Freq: Once | INTRAVENOUS | Status: AC | PRN
  Administered 2019-02-13: 500 [IU]
  Filled 2019-02-13: qty 5

## 2019-02-13 MED ORDER — SODIUM CHLORIDE 0.9% FLUSH
10.0000 mL | Freq: Once | INTRAVENOUS | Status: AC
  Administered 2019-02-13: 10 mL
  Filled 2019-02-13: qty 10

## 2019-02-13 MED ORDER — ACETAMINOPHEN 325 MG PO TABS
ORAL_TABLET | ORAL | Status: AC
  Filled 2019-02-13: qty 2

## 2019-02-13 MED ORDER — TRASTUZUMAB CHEMO 150 MG IV SOLR
6.0000 mg/kg | Freq: Once | INTRAVENOUS | Status: AC
  Administered 2019-02-13: 525 mg via INTRAVENOUS
  Filled 2019-02-13: qty 25

## 2019-02-13 NOTE — Patient Instructions (Signed)
Coronavirus (COVID-19) Are you at risk?  Are you at risk for the Coronavirus (COVID-19)?  To be considered HIGH RISK for Coronavirus (COVID-19), you have to meet the following criteria:  . Traveled to [place], [place], [name], [place] or [place]; or in the [place] to [place], [place], [place], or [place]; and have fever, cough, and shortness of breath within the last 2 weeks of travel OR . Been in close contact with a person diagnosed with COVID-19 within the last 2 weeks and have fever, cough, and shortness of breath . IF YOU DO NOT MEET THESE CRITERIA, YOU ARE CONSIDERED LOW RISK FOR COVID-19.  What to do if you are HIGH RISK for COVID-19?  . If you are having a medical emergency, call 911. . Seek medical care right away. Before you go to a doctor's office, urgent care or emergency department, call ahead and tell them about your recent travel, contact with someone diagnosed with COVID-19, and your symptoms. You should receive instructions from your physician's office regarding next steps of care.  . When you arrive at healthcare provider, tell the healthcare staff immediately you have returned from visiting [place], [place], [place], [place] or [name]; or traveled in the [place] to [place], [place], [place], or [place]; in the last two weeks or you have been in close contact with a person diagnosed with COVID-19 in the last 2 weeks.   . Tell the health care staff about your symptoms: fever, cough and shortness of breath. . After you have been seen by a medical provider, you will be either: o Tested for (COVID-19) and discharged home on quarantine except to seek medical care if symptoms worsen, and asked to  - Stay home and avoid contact with others until you get your results (4-5 days)  - Avoid travel on public transportation if possible (such as bus, train, or airplane) or o Sent to the Emergency Department by EMS for evaluation, COVID-19 testing, and possible  admission depending on your condition and test results.  What to do if you are LOW RISK for COVID-19?  Reduce your risk of any infection by using the same precautions used for avoiding the common cold or flu:  . Wash your hands often with soap and warm water for at least 20 seconds.  If soap and water are not readily available, use an alcohol-based hand sanitizer with at least 60% alcohol.  . If coughing or sneezing, cover your mouth and nose by coughing or sneezing into the elbow areas of your shirt or coat, into a tissue or into your sleeve (not your hands). . Avoid shaking hands with others and consider head nods or verbal greetings only. . Avoid touching your eyes, nose, or mouth with unwashed hands.  . Avoid close contact with people who are sick. . Avoid places or events with large numbers of people in one location, like concerts or sporting events. . Carefully consider travel plans you have or are making. . If you are planning any travel outside or inside the [place], visit the CDC's Travelers' Health webpage for the latest health notices. . If you have some symptoms but not all symptoms, continue to monitor at home and seek medical attention if your symptoms worsen. . If you are having a medical emergency, call 911.   ADDITIONAL HEALTHCARE OPTIONS FOR PATIENTS  [facility] / e-Visit: [email or web address]         [facility]: [phone]  Onset   Urgent Care: [facility]: [place] Discharge Instructions for Patients Receiving Chemotherapy  Today you received the following chemotherapy agents Herdeptin, Perjeta and Abraxane  To help prevent nausea and vomiting after your treatment, we encourage you to take your nausea medication as directed.    If you develop nausea and vomiting that is not controlled by your nausea medication, call the  clinic.   BELOW ARE SYMPTOMS THAT SHOULD BE REPORTED IMMEDIATELY:  *FEVER GREATER THAN 100.5 F  *CHILLS WITH OR WITHOUT FEVER  NAUSEA AND VOMITING THAT IS NOT CONTROLLED WITH YOUR NAUSEA MEDICATION  *UNUSUAL SHORTNESS OF BREATH  *UNUSUAL BRUISING OR BLEEDING  TENDERNESS IN MOUTH AND THROAT WITH OR WITHOUT PRESENCE OF ULCERS  *URINARY PROBLEMS  *BOWEL PROBLEMS  UNUSUAL RASH Items with * indicate a potential emergency and should be followed up as soon as possible.  Feel free to call the clinic should you have any questions or concerns. The clinic phone number is [phone].  Please show the [facility] at check-in to the Emergency Department and triage nurse.

## 2019-03-04 ENCOUNTER — Other Ambulatory Visit: Payer: Self-pay | Admitting: Oncology

## 2019-03-04 ENCOUNTER — Other Ambulatory Visit: Payer: Self-pay | Admitting: *Deleted

## 2019-03-04 DIAGNOSIS — I10 Essential (primary) hypertension: Secondary | ICD-10-CM

## 2019-03-12 ENCOUNTER — Inpatient Hospital Stay: Payer: Medicare Other

## 2019-03-12 ENCOUNTER — Other Ambulatory Visit: Payer: Self-pay

## 2019-03-12 ENCOUNTER — Inpatient Hospital Stay: Payer: Medicare Other | Attending: Oncology

## 2019-03-12 VITALS — BP 139/86 | HR 72 | Temp 98.5°F | Resp 16

## 2019-03-12 DIAGNOSIS — Z5111 Encounter for antineoplastic chemotherapy: Secondary | ICD-10-CM | POA: Insufficient documentation

## 2019-03-12 DIAGNOSIS — Z95828 Presence of other vascular implants and grafts: Secondary | ICD-10-CM

## 2019-03-12 DIAGNOSIS — C792 Secondary malignant neoplasm of skin: Secondary | ICD-10-CM | POA: Insufficient documentation

## 2019-03-12 DIAGNOSIS — C50211 Malignant neoplasm of upper-inner quadrant of right female breast: Secondary | ICD-10-CM

## 2019-03-12 DIAGNOSIS — Z170 Estrogen receptor positive status [ER+]: Secondary | ICD-10-CM

## 2019-03-12 DIAGNOSIS — Z79899 Other long term (current) drug therapy: Secondary | ICD-10-CM | POA: Diagnosis not present

## 2019-03-12 DIAGNOSIS — C770 Secondary and unspecified malignant neoplasm of lymph nodes of head, face and neck: Secondary | ICD-10-CM | POA: Diagnosis not present

## 2019-03-12 DIAGNOSIS — C7931 Secondary malignant neoplasm of brain: Secondary | ICD-10-CM | POA: Diagnosis not present

## 2019-03-12 DIAGNOSIS — C4911 Malignant neoplasm of connective and soft tissue of right upper limb, including shoulder: Secondary | ICD-10-CM

## 2019-03-12 DIAGNOSIS — Z5112 Encounter for antineoplastic immunotherapy: Secondary | ICD-10-CM | POA: Diagnosis present

## 2019-03-12 LAB — COMPREHENSIVE METABOLIC PANEL
ALT: 11 U/L (ref 0–44)
AST: 24 U/L (ref 15–41)
Albumin: 3.8 g/dL (ref 3.5–5.0)
Alkaline Phosphatase: 110 U/L (ref 38–126)
Anion gap: 10 (ref 5–15)
BUN: 20 mg/dL (ref 8–23)
CO2: 31 mmol/L (ref 22–32)
Calcium: 9.7 mg/dL (ref 8.9–10.3)
Chloride: 99 mmol/L (ref 98–111)
Creatinine, Ser: 0.9 mg/dL (ref 0.44–1.00)
GFR calc Af Amer: >60 mL/min (ref >60)
GFR calc non Af Amer: >60 mL/min (ref >60)
Glucose, Bld: 93 mg/dL (ref 70–99)
Potassium: 3.8 mmol/L (ref 3.5–5.1)
Sodium: 140 mmol/L (ref 135–145)
Total Bilirubin: 0.2 mg/dL — ABNORMAL LOW (ref 0.3–1.2)
Total Protein: 8.2 g/dL — ABNORMAL HIGH (ref 6.5–8.1)

## 2019-03-12 LAB — CBC WITH DIFFERENTIAL/PLATELET
Abs Immature Granulocytes: 0.02 10*3/uL (ref 0.00–0.07)
Basophils Absolute: 0 10*3/uL (ref 0.0–0.1)
Basophils Relative: 1 %
Eosinophils Absolute: 0.2 10*3/uL (ref 0.0–0.5)
Eosinophils Relative: 2 %
HCT: 39.9 % (ref 36.0–46.0)
Hemoglobin: 12.2 g/dL (ref 12.0–15.0)
Immature Granulocytes: 0 %
Lymphocytes Relative: 18 %
Lymphs Abs: 1.2 10*3/uL (ref 0.7–4.0)
MCH: 26.4 pg (ref 26.0–34.0)
MCHC: 30.6 g/dL (ref 30.0–36.0)
MCV: 86.4 fL (ref 80.0–100.0)
Monocytes Absolute: 0.7 10*3/uL (ref 0.1–1.0)
Monocytes Relative: 10 %
Neutro Abs: 4.8 10*3/uL (ref 1.7–7.7)
Neutrophils Relative %: 69 %
Platelets: 252 10*3/uL (ref 150–400)
RBC: 4.62 MIL/uL (ref 3.87–5.11)
RDW: 15.7 % — ABNORMAL HIGH (ref 11.5–15.5)
WBC: 6.9 10*3/uL (ref 4.0–10.5)
nRBC: 0 % (ref 0.0–0.2)

## 2019-03-12 MED ORDER — ACETAMINOPHEN 325 MG PO TABS
650.0000 mg | ORAL_TABLET | Freq: Once | ORAL | Status: AC
  Administered 2019-03-12: 650 mg via ORAL

## 2019-03-12 MED ORDER — SODIUM CHLORIDE 0.9 % IV SOLN
420.0000 mg | Freq: Once | INTRAVENOUS | Status: AC
  Administered 2019-03-12: 420 mg via INTRAVENOUS
  Filled 2019-03-12: qty 14

## 2019-03-12 MED ORDER — PACLITAXEL PROTEIN-BOUND CHEMO INJECTION 100 MG
100.0000 mg/m2 | Freq: Once | INTRAVENOUS | Status: AC
  Administered 2019-03-12: 200 mg via INTRAVENOUS
  Filled 2019-03-12: qty 40

## 2019-03-12 MED ORDER — TRASTUZUMAB CHEMO 150 MG IV SOLR
6.0000 mg/kg | Freq: Once | INTRAVENOUS | Status: AC
  Administered 2019-03-12: 525 mg via INTRAVENOUS
  Filled 2019-03-12: qty 25

## 2019-03-12 MED ORDER — DIPHENHYDRAMINE HCL 25 MG PO CAPS
25.0000 mg | ORAL_CAPSULE | Freq: Once | ORAL | Status: AC
  Administered 2019-03-12: 25 mg via ORAL

## 2019-03-12 MED ORDER — PROCHLORPERAZINE MALEATE 10 MG PO TABS
10.0000 mg | ORAL_TABLET | Freq: Once | ORAL | Status: AC
  Administered 2019-03-12: 15:00:00 10 mg via ORAL

## 2019-03-12 MED ORDER — PROCHLORPERAZINE MALEATE 10 MG PO TABS
ORAL_TABLET | ORAL | Status: AC
  Filled 2019-03-12: qty 1

## 2019-03-12 MED ORDER — SODIUM CHLORIDE 0.9 % IV SOLN
Freq: Once | INTRAVENOUS | Status: AC
  Administered 2019-03-12: 14:00:00 via INTRAVENOUS
  Filled 2019-03-12: qty 250

## 2019-03-12 MED ORDER — DIPHENHYDRAMINE HCL 25 MG PO CAPS
ORAL_CAPSULE | ORAL | Status: AC
  Filled 2019-03-12: qty 1

## 2019-03-12 MED ORDER — SODIUM CHLORIDE 0.9% FLUSH
10.0000 mL | INTRAVENOUS | Status: DC | PRN
  Administered 2019-03-12: 10 mL
  Filled 2019-03-12: qty 10

## 2019-03-12 MED ORDER — SODIUM CHLORIDE 0.9 % IV SOLN
Freq: Once | INTRAVENOUS | Status: DC
  Filled 2019-03-12: qty 250

## 2019-03-12 MED ORDER — SODIUM CHLORIDE 0.9% FLUSH
10.0000 mL | Freq: Once | INTRAVENOUS | Status: AC
  Administered 2019-03-12: 10 mL
  Filled 2019-03-12: qty 10

## 2019-03-12 MED ORDER — ACETAMINOPHEN 325 MG PO TABS
ORAL_TABLET | ORAL | Status: AC
  Filled 2019-03-12: qty 2

## 2019-03-12 MED ORDER — HEPARIN SOD (PORK) LOCK FLUSH 100 UNIT/ML IV SOLN
500.0000 [IU] | Freq: Once | INTRAVENOUS | Status: AC | PRN
  Administered 2019-03-12: 500 [IU]
  Filled 2019-03-12: qty 5

## 2019-03-12 NOTE — Patient Instructions (Signed)
[facility] Discharge Instructions for Patients Receiving Chemotherapy  Today you received the following chemotherapy agents Herceptin, Perjeta, Abraxane.  To help prevent nausea and vomiting after your treatment, we encourage you to take your nausea medication as directed.  If you develop nausea and vomiting that is not controlled by your nausea medication, call the clinic.   BELOW ARE SYMPTOMS THAT SHOULD BE REPORTED IMMEDIATELY:  *FEVER GREATER THAN 100.5 F  *CHILLS WITH OR WITHOUT FEVER  NAUSEA AND VOMITING THAT IS NOT CONTROLLED WITH YOUR NAUSEA MEDICATION  *UNUSUAL SHORTNESS OF BREATH  *UNUSUAL BRUISING OR BLEEDING  TENDERNESS IN MOUTH AND THROAT WITH OR WITHOUT PRESENCE OF ULCERS  *URINARY PROBLEMS  *BOWEL PROBLEMS  UNUSUAL RASH Items with * indicate a potential emergency and should be followed up as soon as possible.  Feel free to call the clinic should you have any questions or concerns. The clinic phone number is [phone].  Please show the [facility] at check-in to the Emergency Department and triage nurse.

## 2019-03-18 ENCOUNTER — Other Ambulatory Visit: Payer: Self-pay | Admitting: Oncology

## 2019-03-18 DIAGNOSIS — G629 Polyneuropathy, unspecified: Secondary | ICD-10-CM

## 2019-04-08 NOTE — Progress Notes (Signed)
[name]  [phone] [phone]) [phone]     ID: [name]   DOB: [birth year]  MR#: [id number]  JEH#:[id number]  Patient Care Team: [name], [name], MD as Consulting Physician (Oncology) [name], MD as Consulting Physician (Radiation Oncology) [name], [name], MD as Consulting Physician (Cardiology) [name], [name] 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   She continues on Abraxane which she is tolerating well.  She receives this every 28 days, with a dose due today. She reports the first few days following infusion are tough, then she gets a "jump start" with a lot of energy. Then she crashes again a week later.  The patient also continues on every 28-day trastuzumab and pertuzumab, which she is tolerating well.   Her most recent echocardiogram was on 02/11/2019, which showed an ejection fraction of 55-60%. This is stable since her previous echo on 08/20/2018.   REVIEW OF SYSTEMS: [name] reports feeling okay today. The patient denies unusual headaches, visual changes, nausea, vomiting, stiff neck, dizziness, or gait imbalance. There has been no cough, phlegm production, or pleurisy, no chest pain or pressure, and no change in bowel or bladder habits. The patient denies fever, rash, bleeding, unexplained fatigue or unexplained weight loss. A detailed review of systems was otherwise entirely negative.   BREAST CANCER HISTORY: From the original intake note:  The patient originally presented in May 2003 when she noticed a lump in the upper inner quadrant of her right breast in September 2003.  She sought attention and had a mammogram  which showed an obvious carcinoma in the upper inner quadrant of the right breast, approximately 2 cm.  There were some enlarged lymph nodes in the axilla and an FNA done showed those consistent with malignant cells, most likely an invasive ductal carcinoma.  At that point she was unsure of what to do and was referred by Dr. [name] for a discussion of her treatment options.  By biopsy, it was ER/PR negative and HER-2 was 3+.  DNA index was 1.42.  We reiterated that it was most important to have her disease surgically addressed at that point and had recommended lumpectomy and axillary nodes to be addressed with which she followed through and on 04-03-02, Dr. [name] performed a right partial mastectomy and a right axillary lymph node dissection.  Final pathology revealed a 2.4 cm. high grade, Grade III invasive ductal carcinoma with an adjacent .8 cm. also high grade invasive ductal carcinoma which was felt to represent an intramammary metastasis rather than a second primary.  A smaller mass was just medial to the larger mass.  The smaller mass was associated with high grade DCIS component.  There was no definite lymphovascular invasion identified.  However, one of fourteen axillary lymph nodes did contain a 1.5 cm. metastatic deposit.  Postoperatively she did very well.  We reiterated the fact that she did need adjuvant chemotherapy, however, she refused and decided that she would pursue radiation.  She received radiation and completed that on 08-14-02. Her subsequent history is as detailed below.   PAST MEDICAL HISTORY: Past Medical History:  Diagnosis Date   Breast cancer ([place])    DVT (deep venous thrombosis) ([place]) 10/07/2011   Fever  02/06/2018   Hypertension    Radiation 11/29/2005-12/29/2005   right supraclavicular area 4147 cGy   Radiation 06/26/02-08/14/02   right breast 5040 cGy, tumor bed boosted to 1260 cGy   Rheumatic fever     PAST SURGICAL HISTORY: Past Surgical History:    Procedure Laterality Date   BREAST SURGERY     MASTECTOMY     bilateral   PORT A CATH REVISION      FAMILY HISTORY Family History  Problem Relation Age of Onset   Pancreatic cancer Mother    Kidney cancer Sister 51   Breast cancer Sister 1   Breast cancer Other 10  The patient's father died in his 35T  from complications of alcohol abuse. The patient's mother died in her 28s from pancreatic cancer. The patient has 6 brothers, 2 sisters. Both her sisters have had breast cancer, both diagnosed after the age of 73. The patient has not had genetic testing so far.   GYNECOLOGIC HISTORY: Menarche age 64; first live birth age 46. She is GXP4. She is not sure when she stopped having periods. She never used hormone replacement.   SOCIAL HISTORY: The patient has worked in the past as a Psychologist, counselling. At home in addition to the patient are her husband [name], originally from [place], who works as a Administrator.  Also living in the home is her daughter [name] (she is studying to be a Marine scientist) and grandson.  The other 2 children are [name], who has a college degree in psychology and works as a Probation officer; and [name], who lives in [place] and works as a Higher education careers adviser. [name] also sometimes stays in the home. In addition the patient has an aide who helps her almost daily.   ADVANCED DIRECTIVES: Not in place   HEALTH MAINTENANCE: Social History   Tobacco Use   Smoking status: Light Tobacco Smoker   Smokeless tobacco: Never Used  Substance Use Topics   Alcohol use: Yes    Comment: Rarely   Drug use: No     Colonoscopy:  Not on file  PAP:  Not on file  Bone density:  Not on file  Lipid panel:  Not on file    Allergies  Allergen Reactions   Pork-Derived Products Other (See Comments)    Pt says she just doesn't eat pork; has no reaction to pork products   Tramadol Nausea Only   Hydrocodone-Acetaminophen Itching and Rash    Current Outpatient Medications   Medication Sig Dispense Refill   ALPRAZolam (XANAX) 0.5 MG tablet Take 1 tablet (0.5 mg total) by mouth 2 (two) times daily as needed for anxiety. 45 tablet 0   Ascorbic Acid (VITAMIN C) 100 MG tablet Take 100 mg by mouth daily.       cholecalciferol (VITAMIN D) 1000 units tablet Take 1 tablet (1,000 Units total) by mouth daily. 90 tablet 4   gabapentin (NEURONTIN) 300 MG capsule TAKE ONE CAPSULE BY MOUTH TWICE DAILY AS NEEDED 60 capsule 0   hydrOXYzine (ATARAX/VISTARIL) 25 MG tablet TAKE 1 TABLET BY MOUTH THREE TIMES A DAY AS NEEDED 270 tablet 1   magic mouthwash SOLN SWISH AND SPIT OR SWALLOW WITH 5MLS BEFORE MEALS AND AT BEDTIME 240 mL 1   methadone (DOLOPHINE) 10 MG tablet Take 10 mg by mouth 5 (five) times daily.  0   Multiple Vitamin (MULTIVITAMIN) capsule Take 1 capsule by mouth daily.       Oxycodone HCl 20 MG TABS Take 15 mg by mouth  4 (four) times daily.   0   potassium chloride (MICRO-K) 10 MEQ CR capsule TAKE TWO CAPSULES BY MOUTH EVERY DAY 60 capsule 1   prochlorperazine (COMPAZINE) 10 MG tablet Take 1 tablet (10 mg total) by mouth every 6 (six) hours as needed for nausea or vomiting. 30 tablet 0   triamterene-hydrochlorothiazide (DYAZIDE) 37.5-25 MG capsule Take 1 each (1 capsule total) by mouth every morning. 30 capsule 3   No current facility-administered medications for this visit.    Facility-Administered Medications Ordered in Other Visits  Medication Dose Route Frequency Provider Last Rate Last Dose   sodium chloride 0.9 % injection 10 mL  10 mL Intravenous PRN Kimberley Dastrup, [name], MD   10 mL at 03/04/14 1421    OBJECTIVE: Middle-aged African-American woman in no acute distress Vitals:   04/09/19 1138  BP: (!) 148/69  Pulse: 78  Resp: 18  Temp: 98.2 F (36.8 C)     Body mass index is 34.28 kg/m.    Filed Weights   04/09/19 1138  Weight: 206 lb (93.4 kg)    ECOG FS: 1  Sclerae unicteric, EOMs intact Wearing a mask No cervical or supraclavicular  adenopathy Lungs no rales or rhonchi Heart regular rate and rhythm Abd soft, nontender, positive bowel sounds MSK no focal spinal tenderness, chronic right upper extremity lymphedema Neuro: nonfocal, well oriented, appropriate affect Breasts: Status post bilateral mastectomies Skin: The lesions on the back are stable as compared to prior     Photo 07/04/2017     Photo 08/23/2018   Photo 12/19/2018     Photo 01/16/2019    LAB RESULTS:.  Lab Results  Component Value Date   WBC 6.9 04/09/2019   NEUTROABS 4.2 04/09/2019   HGB 11.2 (L) 04/09/2019   HCT 37.5 04/09/2019   MCV 87.6 04/09/2019   PLT 239 04/09/2019      Chemistry      Component Value Date/Time   NA 140 03/12/2019 1149   NA 142 11/09/2017 1214   K 3.8 03/12/2019 1149   K 3.9 11/09/2017 1214   CL 99 03/12/2019 1149   CL 101 03/07/2013 1411   CO2 31 03/12/2019 1149   CO2 32 (H) 11/09/2017 1214   BUN 20 03/12/2019 1149   BUN 19.1 11/09/2017 1214   CREATININE 0.90 03/12/2019 1149   CREATININE 1.04 (H) 05/24/2018 0848   CREATININE 1.0 11/09/2017 1214      Component Value Date/Time   CALCIUM 9.7 03/12/2019 1149   CALCIUM 9.1 11/09/2017 1214   ALKPHOS 110 03/12/2019 1149   ALKPHOS 120 11/09/2017 1214   AST 24 03/12/2019 1149   AST 17 05/24/2018 0848   AST 30 11/09/2017 1214   ALT 11 03/12/2019 1149   ALT 11 05/24/2018 0848   ALT 19 11/09/2017 1214   BILITOT 0.2 (L) 03/12/2019 1149   BILITOT 0.4 05/24/2018 0848   BILITOT <0.22 11/09/2017 1214       STUDIES: No results found.   ASSESSMENT:  63 y.o. [place] woman with stage IV breast cancer manifested chiefly by loco-regional nodal disease (neck, chest) and skin involvement, without liver, bone, or brain metastases documented   (1) Status post right upper inner quadrant lumpectomy and sentinel lymph node sampling 03/04/2002 for 2 separate foci of invasive ductal carcinoma, mpT2 pN1 or stage IIB, both foci grade 3, both estrogen and  progesterone receptor positive, both HercepTest 3+, Mib-1 56%  (2) Reexcision for margins 05/27/2002 showed no residual cancer in the breast.  (3)  The patient refused adjuvant systemic therapy.  (4) Adjuvant radiation treatment completed 08/14/2002.  (5) recurrence in the right breast in 02/2004 showing a morphologically different tumor, again grade 3, again estrogen and progesterone receptor negative, with an MIB-1 of 14% and Herceptest 3+.  (5) Between 03/2004 and 07/2004 she received dose dense Doxorubicin/Cyclophosphamide x 4 given with trastuzumab, followed by weekly Docetaxel x 8, again given with trastuzumab.  (6) Right mastectomy 07/13/2004 showed scattered microscopic foci of residual disease over an area  greater than 5 cm. Margins were negative.  (7) Postoperative Docetaxel continued until 09/2004.  (8) Trastuzumab (Herceptin) given 08/2004 through 01/2012 with some brief interruptions.  RECURRENT/ STAGE IV DISEASE December 2006 (9) Isolated right cervical nodal recurrence 10/2005, treated with radiation to the right supraclavicular area (total 41.5 gray) completed 12/29/2005.  (10) Navelbine given together with Herceptin  11/2005 through 03/2006.  (9) Left mastectomy 02/13/2006 for ductal carcinoma in situ, grade 2, estrogen and progesterone receptor negative, with negative margins; 0 of 3 lymph nodes involved  (10) treated with Lapatinib and Capecitabine before 10/2009, for an unclear duration and with unclear results (cannot locate data on chart review).  (11) Status post right supraclavicular lymph node biopsy 09/2010 again positive for an invasive ductal carcinoma, estrogen and progesterone receptor negative, HER-2 positive by CISH with a ratio 4.25.  (12) Navelbine given together with Herceptin between 05/2011 and 11/2011.  (13) Carboplatin/ Gemcitabine/ Herceptin given for 2 cycles, in 12/2011 and 01/2012.  (14) TDM-1 ([place]) started 02/2012. Last dose 10/02/2013  after which the patient discontinued treatment at her own discretion. Echo on December 2014 showed a well preserved ejection fraction.  (15) Deep vein thrombosis of the right upper extremity documented 04/20/2011.  She completed anticoagulant therapy with Coumadin on 03/25/2013.  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 Left chest Port-A-Cath placed on 04/08/2013; being flushed every 6 weeks  RIGHT UPPER EXTREMITY ANGIOSARCOMA VS BREAST CANCER: August 2016 (18) treated at [facility] of [place] August 2016 with paclitaxel, trastuzumab and pertuzumab 1 cycle, afterwards referred to hospice  (19) under the care of hospice of [facility] August 2016 to 05/08/2016, when the patient opted for a second try at chemotherapy  (20) started low-dose Abraxane, trastuzumab and pertuzumab 06/07/2016, to be repeated every 4 weeks.   (a) pretreatment echocardiogram 06/06/2016 showed a 60-65% ejection fraction  (b) significant compliance problems compromise response to treatment  (c)  echocardiogram 02/20/17 LVEF 55-60%  (d) Abraxane discontinued 07/04/2017 because of possible neuropathy symptoms  (e) Abraxane resumed 09/27/2017  (f) echocardiogram 07/20/2017 showed an ejection fraction of 60-65%  (g) echo 01/22/2018 showed an ejection fraction of 50-55%  (h) CT scan of the chest 05/03/2018 shows no disease involving the lungs liver or lymph nodes, with stable subcutaneous masses as previously noted  (I) Abraxane discontinued August 2019 because of concerns regarding neuropathy-- gemcitabine substituted  (j) echocardiogram 08/20/2018 shows an ejection fraction in the 55-60% range (done every 6 months)  (k) gemcitabine discontinued because of multiple symptoms,  Abraxane restarted 09/11/2018, given once every 4 weeks  (21) Chronic pain secondary to known metastatic disease  (a) patient signed pain contract 10/19/2016  (b) as of 11/16/2016 receives her pain medications from Dr [name]  (c)  Dr [name] no longer able to prescribe as of February  2019  (d) all pain medications now through [facility] (as of April 2019)   PLAN: Jeraldean is now 9 years out from diagnosis of metastatic breast cancer and nearly 4 years out from the development of her angiosarcoma.  These tumors are remarkably stable on continuing doses of Abraxane, and anti-HER-2 immunotherapy.  Accordingly the plan is to continue these indefinitely until there is clear evidence of disease progression  She gets treated every 28 days.  I have placed this appointment in for her and she will see me again in 3 months.  She is due for sleeves for the right upper extremity lymphedema and I have written an order for that  She has gained quite a bit of weight.  She does look very [place] today but I have encouraged her to try to get down to about 190 in the next 3 months.  She is taking appropriate COVID precautions  She knows to call for any other issue that may develop before the next visit.   [name]. Nour Scalise, MD  04/09/19 12:07 PM Medical Oncology and Hematology [facility] [address] [place], [place] [address] Tel. [phone]    Fax. [phone]   I, Wilburn Mylar, am acting as scribe for Dr. [name]. Aleksander Edmiston.   I, Lurline Del MD, have reviewed the above documentation for accuracy and completeness, and I agree with the above.

## 2019-04-09 ENCOUNTER — Inpatient Hospital Stay: Payer: Medicare Other

## 2019-04-09 ENCOUNTER — Other Ambulatory Visit: Payer: Self-pay | Admitting: Pharmacist

## 2019-04-09 ENCOUNTER — Inpatient Hospital Stay: Payer: Medicare Other | Attending: Oncology

## 2019-04-09 ENCOUNTER — Other Ambulatory Visit: Payer: Self-pay | Admitting: *Deleted

## 2019-04-09 ENCOUNTER — Inpatient Hospital Stay (HOSPITAL_BASED_OUTPATIENT_CLINIC_OR_DEPARTMENT_OTHER): Payer: Medicare Other | Admitting: Oncology

## 2019-04-09 ENCOUNTER — Other Ambulatory Visit: Payer: Self-pay

## 2019-04-09 VITALS — BP 148/69 | HR 78 | Temp 98.2°F | Resp 18 | Ht 65.0 in | Wt 206.0 lb

## 2019-04-09 DIAGNOSIS — Z803 Family history of malignant neoplasm of breast: Secondary | ICD-10-CM | POA: Insufficient documentation

## 2019-04-09 DIAGNOSIS — Z923 Personal history of irradiation: Secondary | ICD-10-CM

## 2019-04-09 DIAGNOSIS — Z9221 Personal history of antineoplastic chemotherapy: Secondary | ICD-10-CM | POA: Diagnosis not present

## 2019-04-09 DIAGNOSIS — G893 Neoplasm related pain (acute) (chronic): Secondary | ICD-10-CM | POA: Diagnosis not present

## 2019-04-09 DIAGNOSIS — Z5112 Encounter for antineoplastic immunotherapy: Secondary | ICD-10-CM | POA: Insufficient documentation

## 2019-04-09 DIAGNOSIS — Z79899 Other long term (current) drug therapy: Secondary | ICD-10-CM | POA: Diagnosis not present

## 2019-04-09 DIAGNOSIS — Z885 Allergy status to narcotic agent status: Secondary | ICD-10-CM | POA: Insufficient documentation

## 2019-04-09 DIAGNOSIS — F17210 Nicotine dependence, cigarettes, uncomplicated: Secondary | ICD-10-CM | POA: Diagnosis not present

## 2019-04-09 DIAGNOSIS — Z8051 Family history of malignant neoplasm of kidney: Secondary | ICD-10-CM

## 2019-04-09 DIAGNOSIS — Z811 Family history of alcohol abuse and dependence: Secondary | ICD-10-CM | POA: Diagnosis not present

## 2019-04-09 DIAGNOSIS — Z86718 Personal history of other venous thrombosis and embolism: Secondary | ICD-10-CM | POA: Insufficient documentation

## 2019-04-09 DIAGNOSIS — C773 Secondary and unspecified malignant neoplasm of axilla and upper limb lymph nodes: Secondary | ICD-10-CM | POA: Insufficient documentation

## 2019-04-09 DIAGNOSIS — C4911 Malignant neoplasm of connective and soft tissue of right upper limb, including shoulder: Secondary | ICD-10-CM

## 2019-04-09 DIAGNOSIS — Z170 Estrogen receptor positive status [ER+]: Secondary | ICD-10-CM

## 2019-04-09 DIAGNOSIS — Z9013 Acquired absence of bilateral breasts and nipples: Secondary | ICD-10-CM

## 2019-04-09 DIAGNOSIS — Z5111 Encounter for antineoplastic chemotherapy: Secondary | ICD-10-CM | POA: Diagnosis not present

## 2019-04-09 DIAGNOSIS — C50211 Malignant neoplasm of upper-inner quadrant of right female breast: Secondary | ICD-10-CM | POA: Diagnosis not present

## 2019-04-09 DIAGNOSIS — I890 Lymphedema, not elsewhere classified: Secondary | ICD-10-CM | POA: Insufficient documentation

## 2019-04-09 DIAGNOSIS — Z800 Family history of malignant neoplasm of digestive organs: Secondary | ICD-10-CM | POA: Insufficient documentation

## 2019-04-09 DIAGNOSIS — Z171 Estrogen receptor negative status [ER-]: Secondary | ICD-10-CM | POA: Diagnosis not present

## 2019-04-09 DIAGNOSIS — Z95828 Presence of other vascular implants and grafts: Secondary | ICD-10-CM

## 2019-04-09 LAB — CBC WITH DIFFERENTIAL/PLATELET
Abs Immature Granulocytes: 0.03 10*3/uL (ref 0.00–0.07)
Basophils Absolute: 0.1 10*3/uL (ref 0.0–0.1)
Basophils Relative: 1 %
Eosinophils Absolute: 0.3 10*3/uL (ref 0.0–0.5)
Eosinophils Relative: 4 %
HCT: 37.5 % (ref 36.0–46.0)
Hemoglobin: 11.2 g/dL — ABNORMAL LOW (ref 12.0–15.0)
Immature Granulocytes: 0 %
Lymphocytes Relative: 23 %
Lymphs Abs: 1.6 10*3/uL (ref 0.7–4.0)
MCH: 26.2 pg (ref 26.0–34.0)
MCHC: 29.9 g/dL — ABNORMAL LOW (ref 30.0–36.0)
MCV: 87.6 fL (ref 80.0–100.0)
Monocytes Absolute: 0.8 10*3/uL (ref 0.1–1.0)
Monocytes Relative: 12 %
Neutro Abs: 4.2 10*3/uL (ref 1.7–7.7)
Neutrophils Relative %: 60 %
Platelets: 239 10*3/uL (ref 150–400)
RBC: 4.28 MIL/uL (ref 3.87–5.11)
RDW: 15.4 % (ref 11.5–15.5)
WBC: 6.9 10*3/uL (ref 4.0–10.5)
nRBC: 0 % (ref 0.0–0.2)

## 2019-04-09 LAB — COMPREHENSIVE METABOLIC PANEL
ALT: 14 U/L (ref 0–44)
AST: 24 U/L (ref 15–41)
Albumin: 3.5 g/dL (ref 3.5–5.0)
Alkaline Phosphatase: 107 U/L (ref 38–126)
Anion gap: 10 (ref 5–15)
BUN: 14 mg/dL (ref 8–23)
CO2: 31 mmol/L (ref 22–32)
Calcium: 9 mg/dL (ref 8.9–10.3)
Chloride: 99 mmol/L (ref 98–111)
Creatinine, Ser: 0.95 mg/dL (ref 0.44–1.00)
GFR calc Af Amer: >60 mL/min (ref >60)
GFR calc non Af Amer: >60 mL/min (ref >60)
Glucose, Bld: 113 mg/dL — ABNORMAL HIGH (ref 70–99)
Potassium: 3.9 mmol/L (ref 3.5–5.1)
Sodium: 140 mmol/L (ref 135–145)
Total Bilirubin: <0.2 mg/dL — ABNORMAL LOW (ref 0.3–1.2)
Total Protein: 7.8 g/dL (ref 6.5–8.1)

## 2019-04-09 MED ORDER — SODIUM CHLORIDE 0.9 % IV SOLN
420.0000 mg | Freq: Once | INTRAVENOUS | Status: AC
  Administered 2019-04-09: 420 mg via INTRAVENOUS
  Filled 2019-04-09: qty 14

## 2019-04-09 MED ORDER — SODIUM CHLORIDE 0.9 % IV SOLN
Freq: Once | INTRAVENOUS | Status: AC
  Administered 2019-04-09: 13:00:00 via INTRAVENOUS
  Filled 2019-04-09: qty 250

## 2019-04-09 MED ORDER — TRASTUZUMAB CHEMO 150 MG IV SOLR
6.0000 mg/kg | Freq: Once | INTRAVENOUS | Status: AC
  Administered 2019-04-09: 525 mg via INTRAVENOUS
  Filled 2019-04-09: qty 25

## 2019-04-09 MED ORDER — DIPHENHYDRAMINE HCL 25 MG PO CAPS
25.0000 mg | ORAL_CAPSULE | Freq: Once | ORAL | Status: AC
  Administered 2019-04-09: 25 mg via ORAL

## 2019-04-09 MED ORDER — HEPARIN SOD (PORK) LOCK FLUSH 100 UNIT/ML IV SOLN
500.0000 [IU] | Freq: Once | INTRAVENOUS | Status: AC | PRN
  Administered 2019-04-09: 500 [IU]
  Filled 2019-04-09: qty 5

## 2019-04-09 MED ORDER — PROCHLORPERAZINE MALEATE 10 MG PO TABS
ORAL_TABLET | ORAL | Status: AC
  Filled 2019-04-09: qty 1

## 2019-04-09 MED ORDER — SODIUM CHLORIDE 0.9% FLUSH
10.0000 mL | Freq: Once | INTRAVENOUS | Status: AC
  Administered 2019-04-09: 10 mL
  Filled 2019-04-09: qty 10

## 2019-04-09 MED ORDER — SODIUM CHLORIDE 0.9% FLUSH
10.0000 mL | INTRAVENOUS | Status: DC | PRN
  Administered 2019-04-09: 10 mL
  Filled 2019-04-09: qty 10

## 2019-04-09 MED ORDER — SODIUM CHLORIDE 0.9 % IV SOLN
Freq: Once | INTRAVENOUS | Status: DC
  Filled 2019-04-09: qty 250

## 2019-04-09 MED ORDER — PACLITAXEL PROTEIN-BOUND CHEMO INJECTION 100 MG
100.0000 mg/m2 | Freq: Once | INTRAVENOUS | Status: AC
  Administered 2019-04-09: 16:00:00 200 mg via INTRAVENOUS
  Filled 2019-04-09: qty 40

## 2019-04-09 MED ORDER — ACETAMINOPHEN 325 MG PO TABS
650.0000 mg | ORAL_TABLET | Freq: Once | ORAL | Status: AC
  Administered 2019-04-09: 650 mg via ORAL

## 2019-04-09 MED ORDER — PROCHLORPERAZINE MALEATE 10 MG PO TABS
10.0000 mg | ORAL_TABLET | Freq: Once | ORAL | Status: AC
  Administered 2019-04-09: 13:00:00 10 mg via ORAL

## 2019-04-09 NOTE — Addendum Note (Signed)
Addended by: Chauncey Cruel on: 04/09/2019 01:21 PM   Modules accepted: Orders

## 2019-04-09 NOTE — Patient Instructions (Signed)
[place] Discharge Instructions for Patients Receiving Chemotherapy  Today you received the following chemotherapy agents Herceptin, Perjeta, Abraxane.  To help prevent nausea and vomiting after your treatment, we encourage you to take your nausea medication as directed.  If you develop nausea and vomiting that is not controlled by your nausea medication, call the clinic.   BELOW ARE SYMPTOMS THAT SHOULD BE REPORTED IMMEDIATELY:  *FEVER GREATER THAN 100.5 F  *CHILLS WITH OR WITHOUT FEVER  NAUSEA AND VOMITING THAT IS NOT CONTROLLED WITH YOUR NAUSEA MEDICATION  *UNUSUAL SHORTNESS OF BREATH  *UNUSUAL BRUISING OR BLEEDING  TENDERNESS IN MOUTH AND THROAT WITH OR WITHOUT PRESENCE OF ULCERS  *URINARY PROBLEMS  *BOWEL PROBLEMS  UNUSUAL RASH Items with * indicate a potential emergency and should be followed up as soon as possible.  Feel free to call the clinic should you have any questions or concerns. The clinic phone number is [phone].  Please show the [name] at check-in to the Emergency Department and triage nurse.

## 2019-04-10 ENCOUNTER — Telehealth: Payer: Self-pay | Admitting: Oncology

## 2019-04-10 NOTE — Telephone Encounter (Signed)
Talk with patient regarding schedule °

## 2019-04-19 ENCOUNTER — Other Ambulatory Visit: Payer: Self-pay

## 2019-04-19 DIAGNOSIS — G629 Polyneuropathy, unspecified: Secondary | ICD-10-CM

## 2019-04-19 MED ORDER — GABAPENTIN 300 MG PO CAPS: 300.0000 mg | ORAL_CAPSULE | Freq: Two times a day (BID) | ORAL | 0 refills | Status: DC | PRN

## 2019-04-30 ENCOUNTER — Other Ambulatory Visit: Payer: Self-pay | Admitting: *Deleted

## 2019-04-30 MED ORDER — POTASSIUM CHLORIDE ER 10 MEQ PO CPCR: 20.0000 meq | ORAL_CAPSULE | Freq: Every day | ORAL | 1 refills | Status: DC

## 2019-05-07 ENCOUNTER — Inpatient Hospital Stay: Payer: Medicare Other

## 2019-05-07 ENCOUNTER — Other Ambulatory Visit: Payer: Self-pay

## 2019-05-07 VITALS — BP 130/89 | HR 55 | Temp 98.7°F | Resp 16

## 2019-05-07 DIAGNOSIS — C50211 Malignant neoplasm of upper-inner quadrant of right female breast: Secondary | ICD-10-CM

## 2019-05-07 DIAGNOSIS — Z170 Estrogen receptor positive status [ER+]: Secondary | ICD-10-CM

## 2019-05-07 DIAGNOSIS — Z5112 Encounter for antineoplastic immunotherapy: Secondary | ICD-10-CM | POA: Diagnosis not present

## 2019-05-07 DIAGNOSIS — C4911 Malignant neoplasm of connective and soft tissue of right upper limb, including shoulder: Secondary | ICD-10-CM

## 2019-05-07 DIAGNOSIS — Z95828 Presence of other vascular implants and grafts: Secondary | ICD-10-CM

## 2019-05-07 LAB — COMPREHENSIVE METABOLIC PANEL
ALT: 14 U/L (ref 0–44)
AST: 26 U/L (ref 15–41)
Albumin: 3.7 g/dL (ref 3.5–5.0)
Alkaline Phosphatase: 101 U/L (ref 38–126)
Anion gap: 10 (ref 5–15)
BUN: 19 mg/dL (ref 8–23)
CO2: 27 mmol/L (ref 22–32)
Calcium: 9.2 mg/dL (ref 8.9–10.3)
Chloride: 103 mmol/L (ref 98–111)
Creatinine, Ser: 0.94 mg/dL (ref 0.44–1.00)
GFR calc Af Amer: >60 mL/min (ref >60)
GFR calc non Af Amer: >60 mL/min (ref >60)
Glucose, Bld: 100 mg/dL — ABNORMAL HIGH (ref 70–99)
Potassium: 4.1 mmol/L (ref 3.5–5.1)
Sodium: 140 mmol/L (ref 135–145)
Total Bilirubin: 0.2 mg/dL — ABNORMAL LOW (ref 0.3–1.2)
Total Protein: 8 g/dL (ref 6.5–8.1)

## 2019-05-07 LAB — CBC WITH DIFFERENTIAL/PLATELET
Abs Immature Granulocytes: 0.02 10*3/uL (ref 0.00–0.07)
Basophils Absolute: 0 10*3/uL (ref 0.0–0.1)
Basophils Relative: 1 %
Eosinophils Absolute: 0.1 10*3/uL (ref 0.0–0.5)
Eosinophils Relative: 2 %
HCT: 37.5 % (ref 36.0–46.0)
Hemoglobin: 11.5 g/dL — ABNORMAL LOW (ref 12.0–15.0)
Immature Granulocytes: 0 %
Lymphocytes Relative: 20 %
Lymphs Abs: 1.5 10*3/uL (ref 0.7–4.0)
MCH: 26.3 pg (ref 26.0–34.0)
MCHC: 30.7 g/dL (ref 30.0–36.0)
MCV: 85.6 fL (ref 80.0–100.0)
Monocytes Absolute: 0.6 10*3/uL (ref 0.1–1.0)
Monocytes Relative: 9 %
Neutro Abs: 5.1 10*3/uL (ref 1.7–7.7)
Neutrophils Relative %: 68 %
Platelets: 229 10*3/uL (ref 150–400)
RBC: 4.38 MIL/uL (ref 3.87–5.11)
RDW: 15.8 % — ABNORMAL HIGH (ref 11.5–15.5)
WBC: 7.3 10*3/uL (ref 4.0–10.5)
nRBC: 0 % (ref 0.0–0.2)

## 2019-05-07 MED ORDER — SODIUM CHLORIDE 0.9% FLUSH
10.0000 mL | INTRAVENOUS | Status: DC | PRN
  Administered 2019-05-07: 10 mL
  Filled 2019-05-07: qty 10

## 2019-05-07 MED ORDER — SODIUM CHLORIDE 0.9% FLUSH
10.0000 mL | Freq: Once | INTRAVENOUS | Status: AC
  Administered 2019-05-07: 10 mL
  Filled 2019-05-07: qty 10

## 2019-05-07 MED ORDER — SODIUM CHLORIDE 0.9 % IV SOLN
420.0000 mg | Freq: Once | INTRAVENOUS | Status: AC
  Administered 2019-05-07: 420 mg via INTRAVENOUS
  Filled 2019-05-07: qty 14

## 2019-05-07 MED ORDER — PACLITAXEL PROTEIN-BOUND CHEMO INJECTION 100 MG
100.0000 mg/m2 | Freq: Once | INTRAVENOUS | Status: AC
  Administered 2019-05-07: 200 mg via INTRAVENOUS
  Filled 2019-05-07: qty 40

## 2019-05-07 MED ORDER — TRASTUZUMAB CHEMO 150 MG IV SOLR
6.0000 mg/kg | Freq: Once | INTRAVENOUS | Status: AC
  Administered 2019-05-07: 525 mg via INTRAVENOUS
  Filled 2019-05-07: qty 25

## 2019-05-07 MED ORDER — ACETAMINOPHEN 325 MG PO TABS
650.0000 mg | ORAL_TABLET | Freq: Once | ORAL | Status: AC
  Administered 2019-05-07: 650 mg via ORAL

## 2019-05-07 MED ORDER — PROCHLORPERAZINE MALEATE 10 MG PO TABS
ORAL_TABLET | ORAL | Status: AC
  Filled 2019-05-07: qty 1

## 2019-05-07 MED ORDER — PROCHLORPERAZINE MALEATE 10 MG PO TABS
10.0000 mg | ORAL_TABLET | Freq: Once | ORAL | Status: AC
  Administered 2019-05-07: 10 mg via ORAL

## 2019-05-07 MED ORDER — DIPHENHYDRAMINE HCL 25 MG PO CAPS
25.0000 mg | ORAL_CAPSULE | Freq: Once | ORAL | Status: AC
  Administered 2019-05-07: 25 mg via ORAL

## 2019-05-07 MED ORDER — ACETAMINOPHEN 325 MG PO TABS
ORAL_TABLET | ORAL | Status: AC
  Filled 2019-05-07: qty 2

## 2019-05-07 MED ORDER — DIPHENHYDRAMINE HCL 25 MG PO CAPS
ORAL_CAPSULE | ORAL | Status: AC
  Filled 2019-05-07: qty 1

## 2019-05-07 MED ORDER — SODIUM CHLORIDE 0.9 % IV SOLN
Freq: Once | INTRAVENOUS | Status: AC
  Administered 2019-05-07: 14:00:00 via INTRAVENOUS
  Filled 2019-05-07: qty 250

## 2019-05-07 MED ORDER — HEPARIN SOD (PORK) LOCK FLUSH 100 UNIT/ML IV SOLN
500.0000 [IU] | Freq: Once | INTRAVENOUS | Status: AC | PRN
  Administered 2019-05-07: 500 [IU]
  Filled 2019-05-07: qty 5

## 2019-05-07 NOTE — Patient Instructions (Signed)
[place] Discharge Instructions for Patients Receiving Chemotherapy  Today you received the following chemotherapy agents Herceptin, Perjeta, Abraxane.  To help prevent nausea and vomiting after your treatment, we encourage you to take your nausea medication as directed.  If you develop nausea and vomiting that is not controlled by your nausea medication, call the clinic.   BELOW ARE SYMPTOMS THAT SHOULD BE REPORTED IMMEDIATELY:  *FEVER GREATER THAN 100.5 F  *CHILLS WITH OR WITHOUT FEVER  NAUSEA AND VOMITING THAT IS NOT CONTROLLED WITH YOUR NAUSEA MEDICATION  *UNUSUAL SHORTNESS OF BREATH  *UNUSUAL BRUISING OR BLEEDING  TENDERNESS IN MOUTH AND THROAT WITH OR WITHOUT PRESENCE OF ULCERS  *URINARY PROBLEMS  *BOWEL PROBLEMS  UNUSUAL RASH Items with * indicate a potential emergency and should be followed up as soon as possible.  Feel free to call the clinic should you have any questions or concerns. The clinic phone number is [phone].  Please show the [place] at check-in to the Emergency Department and triage nurse.

## 2019-05-13 ENCOUNTER — Telehealth: Payer: Self-pay | Admitting: *Deleted

## 2019-05-13 ENCOUNTER — Other Ambulatory Visit: Payer: Self-pay | Admitting: *Deleted

## 2019-05-13 DIAGNOSIS — G629 Polyneuropathy, unspecified: Secondary | ICD-10-CM

## 2019-05-13 MED ORDER — GABAPENTIN 300 MG PO CAPS: 300.0000 mg | ORAL_CAPSULE | Freq: Two times a day (BID) | ORAL | 0 refills | Status: DC | PRN

## 2019-05-13 MED ORDER — MAGIC MOUTHWASH: ORAL | 1 refills | Status: DC

## 2019-05-21 ENCOUNTER — Other Ambulatory Visit: Payer: Self-pay | Admitting: *Deleted

## 2019-05-21 DIAGNOSIS — I10 Essential (primary) hypertension: Secondary | ICD-10-CM

## 2019-05-21 DIAGNOSIS — G629 Polyneuropathy, unspecified: Secondary | ICD-10-CM

## 2019-05-21 MED ORDER — TRIAMTERENE-HCTZ 37.5-25 MG PO CAPS: 1.0000 | ORAL_CAPSULE | Freq: Every morning | ORAL | 3 refills | Status: DC

## 2019-05-27 ENCOUNTER — Other Ambulatory Visit: Payer: Self-pay | Admitting: *Deleted

## 2019-05-27 MED ORDER — POTASSIUM CHLORIDE ER 10 MEQ PO CPCR: 20.0000 meq | ORAL_CAPSULE | Freq: Every day | ORAL | 1 refills | Status: DC

## 2019-06-03 ENCOUNTER — Other Ambulatory Visit: Payer: Self-pay | Admitting: *Deleted

## 2019-06-03 DIAGNOSIS — C50211 Malignant neoplasm of upper-inner quadrant of right female breast: Secondary | ICD-10-CM

## 2019-06-03 DIAGNOSIS — Z170 Estrogen receptor positive status [ER+]: Secondary | ICD-10-CM

## 2019-06-04 ENCOUNTER — Other Ambulatory Visit: Payer: Self-pay

## 2019-06-04 ENCOUNTER — Inpatient Hospital Stay: Payer: Medicare Other

## 2019-06-04 ENCOUNTER — Telehealth: Payer: Self-pay | Admitting: *Deleted

## 2019-06-04 ENCOUNTER — Inpatient Hospital Stay: Payer: Medicare Other | Attending: Oncology

## 2019-06-04 ENCOUNTER — Other Ambulatory Visit: Payer: Self-pay | Admitting: *Deleted

## 2019-06-04 VITALS — BP 149/70 | HR 98 | Temp 98.2°F | Resp 20

## 2019-06-04 DIAGNOSIS — Z923 Personal history of irradiation: Secondary | ICD-10-CM | POA: Diagnosis not present

## 2019-06-04 DIAGNOSIS — C50211 Malignant neoplasm of upper-inner quadrant of right female breast: Secondary | ICD-10-CM

## 2019-06-04 DIAGNOSIS — Z7901 Long term (current) use of anticoagulants: Secondary | ICD-10-CM | POA: Diagnosis not present

## 2019-06-04 DIAGNOSIS — Z9013 Acquired absence of bilateral breasts and nipples: Secondary | ICD-10-CM | POA: Insufficient documentation

## 2019-06-04 DIAGNOSIS — Z79899 Other long term (current) drug therapy: Secondary | ICD-10-CM | POA: Insufficient documentation

## 2019-06-04 DIAGNOSIS — C7641 Malignant neoplasm of right upper limb: Secondary | ICD-10-CM | POA: Insufficient documentation

## 2019-06-04 DIAGNOSIS — Z800 Family history of malignant neoplasm of digestive organs: Secondary | ICD-10-CM | POA: Diagnosis not present

## 2019-06-04 DIAGNOSIS — Z95828 Presence of other vascular implants and grafts: Secondary | ICD-10-CM

## 2019-06-04 DIAGNOSIS — Z86718 Personal history of other venous thrombosis and embolism: Secondary | ICD-10-CM | POA: Diagnosis not present

## 2019-06-04 DIAGNOSIS — F17210 Nicotine dependence, cigarettes, uncomplicated: Secondary | ICD-10-CM | POA: Insufficient documentation

## 2019-06-04 DIAGNOSIS — Z170 Estrogen receptor positive status [ER+]: Secondary | ICD-10-CM

## 2019-06-04 DIAGNOSIS — Z171 Estrogen receptor negative status [ER-]: Secondary | ICD-10-CM | POA: Insufficient documentation

## 2019-06-04 DIAGNOSIS — G893 Neoplasm related pain (acute) (chronic): Secondary | ICD-10-CM | POA: Diagnosis not present

## 2019-06-04 DIAGNOSIS — Z5111 Encounter for antineoplastic chemotherapy: Secondary | ICD-10-CM | POA: Insufficient documentation

## 2019-06-04 DIAGNOSIS — Z8051 Family history of malignant neoplasm of kidney: Secondary | ICD-10-CM | POA: Diagnosis not present

## 2019-06-04 DIAGNOSIS — Z811 Family history of alcohol abuse and dependence: Secondary | ICD-10-CM | POA: Insufficient documentation

## 2019-06-04 DIAGNOSIS — Z803 Family history of malignant neoplasm of breast: Secondary | ICD-10-CM | POA: Insufficient documentation

## 2019-06-04 DIAGNOSIS — C4911 Malignant neoplasm of connective and soft tissue of right upper limb, including shoulder: Secondary | ICD-10-CM

## 2019-06-04 DIAGNOSIS — Z5112 Encounter for antineoplastic immunotherapy: Secondary | ICD-10-CM | POA: Insufficient documentation

## 2019-06-04 LAB — CBC WITH DIFFERENTIAL (CANCER CENTER ONLY)
Abs Immature Granulocytes: 0.04 10*3/uL (ref 0.00–0.07)
Basophils Absolute: 0 10*3/uL (ref 0.0–0.1)
Basophils Relative: 0 %
Eosinophils Absolute: 0.3 10*3/uL (ref 0.0–0.5)
Eosinophils Relative: 4 %
HCT: 36.8 % (ref 36.0–46.0)
Hemoglobin: 11.4 g/dL — ABNORMAL LOW (ref 12.0–15.0)
Immature Granulocytes: 1 %
Lymphocytes Relative: 24 %
Lymphs Abs: 1.7 10*3/uL (ref 0.7–4.0)
MCH: 26.2 pg (ref 26.0–34.0)
MCHC: 31 g/dL (ref 30.0–36.0)
MCV: 84.6 fL (ref 80.0–100.0)
Monocytes Absolute: 0.8 10*3/uL (ref 0.1–1.0)
Monocytes Relative: 12 %
Neutro Abs: 4 10*3/uL (ref 1.7–7.7)
Neutrophils Relative %: 59 %
Platelet Count: 185 10*3/uL (ref 150–400)
RBC: 4.35 MIL/uL (ref 3.87–5.11)
RDW: 16.1 % — ABNORMAL HIGH (ref 11.5–15.5)
WBC Count: 6.8 10*3/uL (ref 4.0–10.5)
nRBC: 0 % (ref 0.0–0.2)

## 2019-06-04 LAB — CMP (CANCER CENTER ONLY)
ALT: 11 U/L (ref 0–44)
AST: 21 U/L (ref 15–41)
Albumin: 3.8 g/dL (ref 3.5–5.0)
Alkaline Phosphatase: 102 U/L (ref 38–126)
Anion gap: 12 (ref 5–15)
BUN: 17 mg/dL (ref 8–23)
CO2: 26 mmol/L (ref 22–32)
Calcium: 9.1 mg/dL (ref 8.9–10.3)
Chloride: 104 mmol/L (ref 98–111)
Creatinine: 1.3 mg/dL — ABNORMAL HIGH (ref 0.44–1.00)
GFR, Est AFR Am: 50 mL/min — ABNORMAL LOW (ref >60)
GFR, Estimated: 43 mL/min — ABNORMAL LOW (ref >60)
Glucose, Bld: 118 mg/dL — ABNORMAL HIGH (ref 70–99)
Potassium: 3.8 mmol/L (ref 3.5–5.1)
Sodium: 142 mmol/L (ref 135–145)
Total Bilirubin: 0.3 mg/dL (ref 0.3–1.2)
Total Protein: 7.7 g/dL (ref 6.5–8.1)

## 2019-06-04 MED ORDER — TRASTUZUMAB CHEMO 150 MG IV SOLR
6.0000 mg/kg | Freq: Once | INTRAVENOUS | Status: AC
  Administered 2019-06-04: 525 mg via INTRAVENOUS
  Filled 2019-06-04: qty 25

## 2019-06-04 MED ORDER — HEPARIN SOD (PORK) LOCK FLUSH 100 UNIT/ML IV SOLN
500.0000 [IU] | Freq: Once | INTRAVENOUS | Status: AC | PRN
  Administered 2019-06-04: 500 [IU]
  Filled 2019-06-04: qty 5

## 2019-06-04 MED ORDER — PROCHLORPERAZINE MALEATE 10 MG PO TABS
ORAL_TABLET | ORAL | Status: AC
  Filled 2019-06-04: qty 1

## 2019-06-04 MED ORDER — SODIUM CHLORIDE 0.9 % IV SOLN
Freq: Once | INTRAVENOUS | Status: AC
  Administered 2019-06-04: 14:00:00 via INTRAVENOUS
  Filled 2019-06-04: qty 250

## 2019-06-04 MED ORDER — PACLITAXEL PROTEIN-BOUND CHEMO INJECTION 100 MG
100.0000 mg/m2 | Freq: Once | INTRAVENOUS | Status: AC
  Administered 2019-06-04: 200 mg via INTRAVENOUS
  Filled 2019-06-04: qty 40

## 2019-06-04 MED ORDER — SODIUM CHLORIDE 0.9% FLUSH
10.0000 mL | INTRAVENOUS | Status: DC | PRN
  Administered 2019-06-04: 10 mL
  Filled 2019-06-04: qty 10

## 2019-06-04 MED ORDER — MAGIC MOUTHWASH: ORAL | 1 refills | Status: DC

## 2019-06-04 MED ORDER — PROCHLORPERAZINE MALEATE 10 MG PO TABS
10.0000 mg | ORAL_TABLET | Freq: Once | ORAL | Status: AC
  Administered 2019-06-04: 10 mg via ORAL

## 2019-06-04 MED ORDER — DIPHENHYDRAMINE HCL 25 MG PO CAPS
ORAL_CAPSULE | ORAL | Status: AC
  Filled 2019-06-04: qty 1

## 2019-06-04 MED ORDER — SODIUM CHLORIDE 0.9% FLUSH
10.0000 mL | Freq: Once | INTRAVENOUS | Status: AC
  Administered 2019-06-04: 10 mL
  Filled 2019-06-04: qty 10

## 2019-06-04 MED ORDER — HYDROCODONE-HOMATROPINE 5-1.5 MG/5ML PO SYRP
5.0000 mL | ORAL_SOLUTION | Freq: Once | ORAL | Status: AC
  Administered 2019-06-04: 5 mL via ORAL
  Filled 2019-06-04: qty 5

## 2019-06-04 MED ORDER — ACETAMINOPHEN 325 MG PO TABS
ORAL_TABLET | ORAL | Status: AC
  Filled 2019-06-04: qty 2

## 2019-06-04 MED ORDER — SODIUM CHLORIDE 0.9 % IV SOLN
420.0000 mg | Freq: Once | INTRAVENOUS | Status: AC
  Administered 2019-06-04: 420 mg via INTRAVENOUS
  Filled 2019-06-04: qty 14

## 2019-06-04 MED ORDER — DIPHENHYDRAMINE HCL 25 MG PO CAPS
25.0000 mg | ORAL_CAPSULE | Freq: Once | ORAL | Status: AC
  Administered 2019-06-04: 25 mg via ORAL

## 2019-06-04 MED ORDER — ACETAMINOPHEN 325 MG PO TABS
650.0000 mg | ORAL_TABLET | Freq: Once | ORAL | Status: AC
  Administered 2019-06-04: 650 mg via ORAL

## 2019-06-04 NOTE — Patient Instructions (Signed)

## 2019-06-04 NOTE — Addendum Note (Signed)
Addended by: Charlyn Minerva on: 06/04/2019 02:17 PM   Modules accepted: Orders

## 2019-06-04 NOTE — Patient Instructions (Signed)
[place] Discharge Instructions for Patients Receiving Chemotherapy  Today you received the following chemotherapy agents Herceptin, Perjeta, Abraxane.  To help prevent nausea and vomiting after your treatment, we encourage you to take your nausea medication as directed.  If you develop nausea and vomiting that is not controlled by your nausea medication, call the clinic.   BELOW ARE SYMPTOMS THAT SHOULD BE REPORTED IMMEDIATELY:  *FEVER GREATER THAN 100.5 F  *CHILLS WITH OR WITHOUT FEVER  NAUSEA AND VOMITING THAT IS NOT CONTROLLED WITH YOUR NAUSEA MEDICATION  *UNUSUAL SHORTNESS OF BREATH  *UNUSUAL BRUISING OR BLEEDING  TENDERNESS IN MOUTH AND THROAT WITH OR WITHOUT PRESENCE OF ULCERS  *URINARY PROBLEMS  *BOWEL PROBLEMS  UNUSUAL RASH Items with * indicate a potential emergency and should be followed up as soon as possible.  Feel free to call the clinic should you have any questions or concerns. The clinic phone number is [phone].  Please show the [place] at check-in to the Emergency Department and triage nurse.

## 2019-06-04 NOTE — Telephone Encounter (Signed)
Per MD- may give pt 5 ml hycodan elixir for cough.

## 2019-06-05 ENCOUNTER — Other Ambulatory Visit: Payer: Self-pay

## 2019-06-05 MED ORDER — PROCHLORPERAZINE MALEATE 10 MG PO TABS: 10.0000 mg | ORAL_TABLET | Freq: Four times a day (QID) | ORAL | 0 refills | Status: DC | PRN

## 2019-06-11 ENCOUNTER — Other Ambulatory Visit: Payer: Self-pay | Admitting: *Deleted

## 2019-06-11 MED ORDER — PROCHLORPERAZINE MALEATE 10 MG PO TABS: 10.0000 mg | ORAL_TABLET | Freq: Four times a day (QID) | ORAL | 0 refills | Status: DC | PRN

## 2019-06-17 ENCOUNTER — Encounter: Payer: Self-pay | Admitting: *Deleted

## 2019-06-17 NOTE — Progress Notes (Signed)
[place] Work  Community education officer from patient requesting an application for [employer].  CSW contacted patient at home at left a messaging informing patient that she must first call [place] and they will mail/email her an application.  CSW provided number to cancer care, but also encouraged patient to call if she had any questions or concerns.  Johnnye Lana, MSW, LCSW, OSW-C Clinical Social Worker [facility] [phone]

## 2019-07-01 NOTE — Progress Notes (Signed)
[place]  Telephone:([phone]) [phone] [phone]) [phone]     ID: [name]   DOB: November 09, 1955  MR#: [id number]  GHW#:[id number]  Patient Care Team: Patient, No Pcp Per as PCP - General (General Practice) Rehan Holness, [name], MD as Consulting Physician (Oncology) [name], MD as Consulting Physician (Radiation Oncology) [name], [name], MD as Consulting Physician (Cardiology) [name], [name] 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 She was last seen here on 04/09/2019.   She continues on abraxane, which she receives every 28 days.  She tolerates this with no side effects that she is aware of and particularly no worsening of minimal baseline neuropathy.  She also continues on trastuzumab and pertuzumab, which she receives every 28 days.  She also tolerates this with no significant side effects.  [name]'s last echocardiogram on 02/11/2019, showed an ejection fraction in the 55% - 60% range. This is stable from her last echo on 08/20/2018.   Since her last visit here, she underwent a chest CT with contrast on 05/04/2019 showing: No findings for metastatic disease involving the chest. No mediastinal/hilar lymphadenopathy or metastatic pulmonary nodules. Stable surgical changes from a right mastectomy. No findings to suggest chest wall recurrence or metastatic disease. Subcutaneous nodularity involving the upper right posterior chest. This appears fairly stable. No new/progressive findings. Recommend clinical correlation. No significant upper abdominal findings.   REVIEW OF SYSTEMS: [name] has gained a lot of weight.  She tells me she no  longer is worried about that.  She and her husband and daughter, who are all at home, taking appropriate precautions.  Her husband of course is a Administrator and her daughter works outside the home.  A detailed review of systems today was otherwise stable   BREAST CANCER HISTORY: From the original intake note:  The patient originally presented in May 2003 when she noticed a lump in the upper inner quadrant of her right breast in September 2003.  She sought attention and had a mammogram which showed an obvious carcinoma in the upper inner quadrant of the right breast, approximately 2 cm.  There were some enlarged lymph nodes in the axilla and an FNA done showed those consistent with malignant cells, most likely an invasive ductal carcinoma.  At that point she was unsure of what to do and was referred by Dr. [name] for a discussion of her treatment options.  By biopsy, it was ER/PR negative and HER-2 was 3+.  DNA index was 1.42.  We reiterated that it was most important to have her disease surgically addressed at that point and had recommended lumpectomy and axillary nodes to be addressed with which she followed through and on 04-03-02, Dr. [name] performed a right partial mastectomy and a right axillary lymph node dissection.  Final pathology revealed a 2.4 cm. high grade, Grade III invasive ductal carcinoma with an adjacent .8 cm. also high grade invasive ductal carcinoma which was felt to represent an intramammary metastasis rather than a second primary.  A smaller mass was just medial to the larger mass.  The smaller mass was associated with high grade DCIS component.  There was no definite lymphovascular invasion identified.  However, one of fourteen axillary lymph nodes did  contain a 1.5 cm. metastatic deposit.  Postoperatively she did very well.  We reiterated the fact that she did need adjuvant chemotherapy, however, she refused and decided that she would pursue radiation.  She received radiation  and completed that on 08-14-02. Her subsequent history is as detailed below.   PAST MEDICAL HISTORY: Past Medical History:  Diagnosis Date   Breast cancer ([place])    DVT (deep venous thrombosis) ([place]) 10/07/2011   Fever 02/06/2018   Hypertension    Radiation 11/29/2005-12/29/2005   right supraclavicular area 4147 cGy   Radiation 06/26/02-08/14/02   right breast 5040 cGy, tumor bed boosted to 1260 cGy   Rheumatic fever     PAST SURGICAL HISTORY: Past Surgical History:  Procedure Laterality Date   BREAST SURGERY     MASTECTOMY     bilateral   PORT A CATH REVISION      FAMILY HISTORY Family History  Problem Relation Age of Onset   Pancreatic cancer Mother    Kidney cancer Sister 62   Breast cancer Sister 27   Breast cancer Other 45  The patient's father died in his 51V  from complications of alcohol abuse. The patient's mother died in her 32s from pancreatic cancer. The patient has 6 brothers, 2 sisters. Both her sisters have had breast cancer, both diagnosed after the age of 14. The patient has not had genetic testing so far.   GYNECOLOGIC HISTORY: Menarche age 1; first live birth age 30. She is GXP4. She is not sure when she stopped having periods. She never used hormone replacement.   SOCIAL HISTORY: The patient has worked in the past as a Psychologist, counselling. At home in addition to the patient are her husband [name], originally from [place], who works as a Administrator.  Also living in the home is her daughter [name] (she is studying to be a Marine scientist) and grandson.  The other 2 children are [name], who has a college degree in psychology and works as a Probation officer; and [name], who lives in [place] and works as a Higher education careers adviser. Son [name] also sometimes stays in the home. In addition the patient has an aide who helps her almost daily.   ADVANCED DIRECTIVES: Not in place   HEALTH MAINTENANCE: Social History   Tobacco Use   Smoking status: Light Tobacco Smoker    Smokeless tobacco: Never Used  Substance Use Topics   Alcohol use: Yes    Comment: Rarely   Drug use: No     Colonoscopy:  Not on file  PAP:  Not on file  Bone density:  Not on file  Lipid panel:  Not on file    Allergies  Allergen Reactions   Pork-Derived Products Other (See Comments)    Pt says she just doesn't eat pork; has no reaction to pork products   Tramadol Nausea Only   Hydrocodone-Acetaminophen Itching and Rash    Current Outpatient Medications  Medication Sig Dispense Refill   ALPRAZolam (XANAX) 0.5 MG tablet Take 1 tablet (0.5 mg total) by mouth 2 (two) times daily as needed for anxiety. 45 tablet 0   Ascorbic Acid (VITAMIN C) 100 MG tablet Take 100 mg by mouth daily.       cholecalciferol (VITAMIN D) 1000 units tablet Take 1 tablet (1,000 Units total) by mouth daily. 90 tablet 4   gabapentin (NEURONTIN) 300 MG capsule Take 1 capsule (300 mg total) by mouth 2 (two) times daily as needed. 60 capsule 0   hydrOXYzine (ATARAX/VISTARIL) 25 MG  tablet TAKE 1 TABLET BY MOUTH THREE TIMES A DAY AS NEEDED 270 tablet 1   magic mouthwash SOLN SWISH AND SPIT OR SWALLOW WITH 5MLS BEFORE MEALS AND AT BEDTIME 240 mL 1   methadone (DOLOPHINE) 10 MG tablet Take 10 mg by mouth 5 (five) times daily.  0   Multiple Vitamin (MULTIVITAMIN) capsule Take 1 capsule by mouth daily.       Oxycodone HCl 20 MG TABS Take 15 mg by mouth 4 (four) times daily.   0   potassium chloride (MICRO-K) 10 MEQ CR capsule Take 2 capsules (20 mEq total) by mouth daily. 60 capsule 1   prochlorperazine (COMPAZINE) 10 MG tablet Take 1 tablet (10 mg total) by mouth every 6 (six) hours as needed for nausea or vomiting. 30 tablet 0   triamterene-hydrochlorothiazide (DYAZIDE) 37.5-25 MG capsule Take 1 each (1 capsule total) by mouth every morning. 30 capsule 3   No current facility-administered medications for this visit.    Facility-Administered Medications Ordered in Other Visits  Medication Dose  Route Frequency Provider Last Rate Last Dose   sodium chloride 0.9 % injection 10 mL  10 mL Intravenous PRN [name], [name], MD   10 mL at 03/04/14 1421    OBJECTIVE: Middle-aged African-American woman who appears stated age  63:   07/02/19 1208  BP: 128/80  Pulse: 61  Resp: 18  Temp: 97.8 F (36.6 C)  SpO2: 98%    Body mass index is 33.98 kg/m.    Filed Weights   07/02/19 1208  Weight: 204 lb 3 oz (92.6 kg)    ECOG FS: 1  Sclerae unicteric, EOMs intact Wearing a mask No cervical or supraclavicular adenopathy Lungs no rales or rhonchi Heart regular rate and rhythm Abd soft, nontender, positive bowel sounds MSK no focal spinal tenderness, no upper extremity lymphedema Neuro: nonfocal, well oriented, appropriate affect Breasts: Status post bilateral mastectomies.  On the left axilla there is a palpable lymph node which is new.   Photo 07/04/2017     Photo 08/23/2018   Photo 12/19/2018     Photo 01/16/2019    LAB RESULTS:.  Lab Results  Component Value Date   WBC 6.8 06/04/2019   NEUTROABS 4.0 06/04/2019   HGB 11.4 (L) 06/04/2019   HCT 36.8 06/04/2019   MCV 84.6 06/04/2019   PLT 185 06/04/2019      Chemistry      Component Value Date/Time   NA 142 06/04/2019 1307   NA 142 11/09/2017 1214   K 3.8 06/04/2019 1307   K 3.9 11/09/2017 1214   CL 104 06/04/2019 1307   CL 101 03/07/2013 1411   CO2 26 06/04/2019 1307   CO2 32 (H) 11/09/2017 1214   BUN 17 06/04/2019 1307   BUN 19.1 11/09/2017 1214   CREATININE 1.30 (H) 06/04/2019 1307   CREATININE 1.0 11/09/2017 1214      Component Value Date/Time   CALCIUM 9.1 06/04/2019 1307   CALCIUM 9.1 11/09/2017 1214   ALKPHOS 102 06/04/2019 1307   ALKPHOS 120 11/09/2017 1214   AST 21 06/04/2019 1307   AST 30 11/09/2017 1214   ALT 11 06/04/2019 1307   ALT 19 11/09/2017 1214   BILITOT 0.3 06/04/2019 1307   BILITOT <0.22 11/09/2017 1214       STUDIES: No results found.   ASSESSMENT:  63  y.o. [place] woman with stage IV breast cancer manifested chiefly by loco-regional nodal disease (neck, chest) and skin involvement, without liver, bone, or brain  metastases documented   (1) Status post right upper inner quadrant lumpectomy and sentinel lymph node sampling 03/04/2002 for 2 separate foci of invasive ductal carcinoma, mpT2 pN1 or stage IIB, both foci grade 3, both estrogen and progesterone receptor positive, both HercepTest 3+, Mib-1 56%  (2) Reexcision for margins 05/27/2002 showed no residual cancer in the breast.  (3) The patient refused adjuvant systemic therapy.  (4) Adjuvant radiation treatment completed 08/14/2002.  (5) recurrence in the right breast in 02/2004 showing a morphologically different tumor, again grade 3, again estrogen and progesterone receptor negative, with an MIB-1 of 14% and Herceptest 3+.  (5) Between 03/2004 and 07/2004 she received dose dense Doxorubicin/Cyclophosphamide x 4 given with trastuzumab, followed by weekly Docetaxel x 8, again given with trastuzumab.  (6) Right mastectomy 07/13/2004 showed scattered microscopic foci of residual disease over an area  greater than 5 cm. Margins were negative.  (7) Postoperative Docetaxel continued until 09/2004.  (8) Trastuzumab (Herceptin) given 08/2004 through 01/2012 with some brief interruptions.  RECURRENT/ STAGE IV DISEASE December 2006 (9) Isolated right cervical nodal recurrence 10/2005, treated with radiation to the right supraclavicular area (total 41.5 gray) completed 12/29/2005.  (10) Navelbine given together with Herceptin  11/2005 through 03/2006.  (9) Left mastectomy 02/13/2006 for ductal carcinoma in situ, grade 2, estrogen and progesterone receptor negative, with negative margins; 0 of 3 lymph nodes involved  (10) treated with Lapatinib and Capecitabine before 10/2009, for an unclear duration and with unclear results (cannot locate data on chart review).  (11) Status post right  supraclavicular lymph node biopsy 09/2010 again positive for an invasive ductal carcinoma, estrogen and progesterone receptor negative, HER-2 positive by CISH with a ratio 4.25.  (12) Navelbine given together with Herceptin between 05/2011 and 11/2011.  (13) Carboplatin/ Gemcitabine/ Herceptin given for 2 cycles, in 12/2011 and 01/2012.  (14) TDM-1 ([place]) started 02/2012. Last dose 10/02/2013 after which the patient discontinued treatment at her own discretion. Echo on December 2014 showed a well preserved ejection fraction.  (15) Deep vein thrombosis of the right upper extremity documented 04/20/2011.  She completed anticoagulant therapy with Coumadin on 03/25/2013.  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 Left chest Port-A-Cath placed on 04/08/2013; being flushed every 6 weeks  RIGHT UPPER EXTREMITY ANGIOSARCOMA VS BREAST CANCER: August 2016 (18) treated at [facility] of [place] August 2016 with paclitaxel, trastuzumab and pertuzumab 1 cycle, afterwards referred to hospice  (19) under the care of hospice of [facility] August 2016 to 05/08/2016, when the patient opted for a second try at chemotherapy  (20) started low-dose Abraxane, trastuzumab and pertuzumab 06/07/2016, to be repeated every 4 weeks.   (a) pretreatment echocardiogram 06/06/2016 showed a 60-65% ejection fraction  (b) significant compliance problems compromise response to treatment  (c)  echocardiogram 02/20/17 LVEF 55-60%  (d) Abraxane discontinued 07/04/2017 because of possible neuropathy symptoms  (e) Abraxane resumed 09/27/2017  (f) echocardiogram 07/20/2017 showed an ejection fraction of 60-65%  (g) echo 01/22/2018 showed an ejection fraction of 50-55%  (h) CT scan of the  chest 05/03/2018 shows no disease involving the lungs liver or lymph nodes, with stable subcutaneous masses as previously noted  (I) Abraxane discontinued August 2019 because of concerns regarding neuropathy-- gemcitabine substituted  (j) echocardiogram 08/20/2018 shows an ejection fraction in the 55-60% range (  done every 6 months)  (k) gemcitabine discontinued because of multiple symptoms, Abraxane restarted 09/11/2018, given once every 4 weeks  (21) Chronic pain secondary to known metastatic disease  (a) patient signed pain contract 10/19/2016  (b) as of 11/16/2016 receives her pain medications from Dr [name]  (c)  Dr [name] no longer able to prescribe as of February 2019  (d) all pain medications now through [facility] (as of April 2019)   PLAN: Twanna is now a little over 4 years out since definitive diagnosis of angiosarcoma on the right side.  Her tumor is well controlled on current treatment which she tolerates well.  Accordingly we are continuing the Abraxane, trastuzumab and Pertuzumab every 28 days as before.  She is already scheduled for her next echocardiogram.  I did palpate a lymph node in her left axilla.  I do not recall this previously.  I am going to set her up for an ultrasound of the left axilla and possible biopsy as appropriate.  I will follow-up with her in 3 weeks just to make sure this is all in order.   She knows to call for any other issue that may develop before the next visit.   [name]. Hilda Rynders, MD  07/02/19 12:21 PM Medical Oncology and Hematology [facility] [address] [place], [place] [address] Tel. [phone]    Fax. [phone]  I, Jacqualyn Posey am acting as a Education administrator for Chauncey Cruel, MD.   I, Lurline Del MD, have reviewed the above documentation for accuracy and completeness, and I agree with the above.

## 2019-07-02 ENCOUNTER — Inpatient Hospital Stay: Payer: Medicare Other

## 2019-07-02 ENCOUNTER — Other Ambulatory Visit: Payer: Self-pay

## 2019-07-02 ENCOUNTER — Inpatient Hospital Stay (HOSPITAL_BASED_OUTPATIENT_CLINIC_OR_DEPARTMENT_OTHER): Payer: Medicare Other | Admitting: Oncology

## 2019-07-02 ENCOUNTER — Inpatient Hospital Stay: Payer: Medicare Other | Attending: Oncology

## 2019-07-02 VITALS — BP 126/66 | HR 54 | Temp 98.4°F | Resp 18

## 2019-07-02 VITALS — BP 128/80 | HR 61 | Temp 97.8°F | Resp 18 | Ht 65.0 in | Wt 204.2 lb

## 2019-07-02 DIAGNOSIS — Z95828 Presence of other vascular implants and grafts: Secondary | ICD-10-CM

## 2019-07-02 DIAGNOSIS — Z170 Estrogen receptor positive status [ER+]: Secondary | ICD-10-CM

## 2019-07-02 DIAGNOSIS — C50211 Malignant neoplasm of upper-inner quadrant of right female breast: Secondary | ICD-10-CM

## 2019-07-02 DIAGNOSIS — Z5111 Encounter for antineoplastic chemotherapy: Secondary | ICD-10-CM | POA: Insufficient documentation

## 2019-07-02 DIAGNOSIS — Z5112 Encounter for antineoplastic immunotherapy: Secondary | ICD-10-CM | POA: Diagnosis present

## 2019-07-02 DIAGNOSIS — C4911 Malignant neoplasm of connective and soft tissue of right upper limb, including shoulder: Secondary | ICD-10-CM

## 2019-07-02 LAB — CMP (CANCER CENTER ONLY)
ALT: 16 U/L (ref 0–44)
AST: 32 U/L (ref 15–41)
Albumin: 3.9 g/dL (ref 3.5–5.0)
Alkaline Phosphatase: 114 U/L (ref 38–126)
Anion gap: 11 (ref 5–15)
BUN: 16 mg/dL (ref 8–23)
CO2: 28 mmol/L (ref 22–32)
Calcium: 9.1 mg/dL (ref 8.9–10.3)
Chloride: 99 mmol/L (ref 98–111)
Creatinine: 0.91 mg/dL (ref 0.44–1.00)
GFR, Est AFR Am: >60 mL/min (ref >60)
GFR, Estimated: >60 mL/min (ref >60)
Glucose, Bld: 111 mg/dL — ABNORMAL HIGH (ref 70–99)
Potassium: 3.8 mmol/L (ref 3.5–5.1)
Sodium: 138 mmol/L (ref 135–145)
Total Bilirubin: 0.3 mg/dL (ref 0.3–1.2)
Total Protein: 7.9 g/dL (ref 6.5–8.1)

## 2019-07-02 LAB — CBC WITH DIFFERENTIAL (CANCER CENTER ONLY)
Abs Immature Granulocytes: 0.02 10*3/uL (ref 0.00–0.07)
Basophils Absolute: 0 10*3/uL (ref 0.0–0.1)
Basophils Relative: 0 %
Eosinophils Absolute: 0.1 10*3/uL (ref 0.0–0.5)
Eosinophils Relative: 2 %
HCT: 37.5 % (ref 36.0–46.0)
Hemoglobin: 11.6 g/dL — ABNORMAL LOW (ref 12.0–15.0)
Immature Granulocytes: 0 %
Lymphocytes Relative: 18 %
Lymphs Abs: 1.4 10*3/uL (ref 0.7–4.0)
MCH: 26.2 pg (ref 26.0–34.0)
MCHC: 30.9 g/dL (ref 30.0–36.0)
MCV: 84.8 fL (ref 80.0–100.0)
Monocytes Absolute: 0.8 10*3/uL (ref 0.1–1.0)
Monocytes Relative: 10 %
Neutro Abs: 5.2 10*3/uL (ref 1.7–7.7)
Neutrophils Relative %: 70 %
Platelet Count: 199 10*3/uL (ref 150–400)
RBC: 4.42 MIL/uL (ref 3.87–5.11)
RDW: 16.5 % — ABNORMAL HIGH (ref 11.5–15.5)
WBC Count: 7.5 10*3/uL (ref 4.0–10.5)
nRBC: 0 % (ref 0.0–0.2)

## 2019-07-02 MED ORDER — ALTEPLASE 2 MG IJ SOLR
INTRAMUSCULAR | Status: AC
  Filled 2019-07-02: qty 2

## 2019-07-02 MED ORDER — SODIUM CHLORIDE 0.9 % IV SOLN
420.0000 mg | Freq: Once | INTRAVENOUS | Status: AC
  Administered 2019-07-02: 420 mg via INTRAVENOUS
  Filled 2019-07-02: qty 14

## 2019-07-02 MED ORDER — DIPHENHYDRAMINE HCL 25 MG PO CAPS
ORAL_CAPSULE | ORAL | Status: AC
  Filled 2019-07-02: qty 1

## 2019-07-02 MED ORDER — TRASTUZUMAB CHEMO 150 MG IV SOLR
6.0000 mg/kg | Freq: Once | INTRAVENOUS | Status: AC
  Administered 2019-07-02: 525 mg via INTRAVENOUS
  Filled 2019-07-02: qty 25

## 2019-07-02 MED ORDER — ALTEPLASE 2 MG IJ SOLR
2.0000 mg | Freq: Once | INTRAMUSCULAR | Status: AC | PRN
  Administered 2019-07-02: 2 mg
  Filled 2019-07-02: qty 2

## 2019-07-02 MED ORDER — SODIUM CHLORIDE 0.9% FLUSH
10.0000 mL | Freq: Once | INTRAVENOUS | Status: AC
  Administered 2019-07-02: 10 mL
  Filled 2019-07-02: qty 10

## 2019-07-02 MED ORDER — DIPHENHYDRAMINE HCL 25 MG PO CAPS
25.0000 mg | ORAL_CAPSULE | Freq: Once | ORAL | Status: AC
  Administered 2019-07-02: 25 mg via ORAL

## 2019-07-02 MED ORDER — PACLITAXEL PROTEIN-BOUND CHEMO INJECTION 100 MG
100.0000 mg/m2 | Freq: Once | INTRAVENOUS | Status: AC
  Administered 2019-07-02: 200 mg via INTRAVENOUS
  Filled 2019-07-02: qty 40

## 2019-07-02 MED ORDER — HEPARIN SOD (PORK) LOCK FLUSH 100 UNIT/ML IV SOLN
500.0000 [IU] | Freq: Once | INTRAVENOUS | Status: AC | PRN
  Administered 2019-07-02: 18:00:00 500 [IU]
  Filled 2019-07-02: qty 5

## 2019-07-02 MED ORDER — PROCHLORPERAZINE MALEATE 10 MG PO TABS
ORAL_TABLET | ORAL | Status: AC
  Filled 2019-07-02: qty 1

## 2019-07-02 MED ORDER — PROCHLORPERAZINE MALEATE 10 MG PO TABS
10.0000 mg | ORAL_TABLET | Freq: Once | ORAL | Status: AC
  Administered 2019-07-02: 10 mg via ORAL

## 2019-07-02 MED ORDER — SODIUM CHLORIDE 0.9% FLUSH
10.0000 mL | INTRAVENOUS | Status: DC | PRN
  Administered 2019-07-02: 10 mL
  Filled 2019-07-02: qty 10

## 2019-07-02 MED ORDER — ACETAMINOPHEN 325 MG PO TABS
650.0000 mg | ORAL_TABLET | Freq: Once | ORAL | Status: AC
  Administered 2019-07-02: 650 mg via ORAL

## 2019-07-02 MED ORDER — SODIUM CHLORIDE 0.9 % IV SOLN
Freq: Once | INTRAVENOUS | Status: AC
  Administered 2019-07-02: 14:00:00 via INTRAVENOUS
  Filled 2019-07-02: qty 250

## 2019-07-02 MED ORDER — ACETAMINOPHEN 325 MG PO TABS
ORAL_TABLET | ORAL | Status: AC
  Filled 2019-07-02: qty 2

## 2019-07-02 NOTE — Progress Notes (Signed)
Unable to get blood return from port. cathflo put in by Delfin Gant, RN. Patient did not want to get labs drawn from arm.

## 2019-07-02 NOTE — Patient Instructions (Signed)
[facility] Discharge Instructions for Patients Receiving Chemotherapy  Today you received the following chemotherapy agents Herceptin, Perjeta, Abraxane.  To help prevent nausea and vomiting after your treatment, we encourage you to take your nausea medication as directed.  If you develop nausea and vomiting that is not controlled by your nausea medication, call the clinic.   BELOW ARE SYMPTOMS THAT SHOULD BE REPORTED IMMEDIATELY:  *FEVER GREATER THAN 100.5 F  *CHILLS WITH OR WITHOUT FEVER  NAUSEA AND VOMITING THAT IS NOT CONTROLLED WITH YOUR NAUSEA MEDICATION  *UNUSUAL SHORTNESS OF BREATH  *UNUSUAL BRUISING OR BLEEDING  TENDERNESS IN MOUTH AND THROAT WITH OR WITHOUT PRESENCE OF ULCERS  *URINARY PROBLEMS  *BOWEL PROBLEMS  UNUSUAL RASH Items with * indicate a potential emergency and should be followed up as soon as possible.  Feel free to call the clinic should you have any questions or concerns. The clinic phone number is [phone].  Please show the CHEMO ALERT CARD at check-in to the Emergency Department and triage nurse.   

## 2019-07-03 ENCOUNTER — Telehealth: Payer: Self-pay | Admitting: Oncology

## 2019-07-03 NOTE — Telephone Encounter (Signed)
I could not reach patient regarding schedule for 9/22

## 2019-07-06 ENCOUNTER — Other Ambulatory Visit: Payer: Self-pay | Admitting: Oncology

## 2019-07-06 DIAGNOSIS — I10 Essential (primary) hypertension: Secondary | ICD-10-CM

## 2019-07-10 ENCOUNTER — Telehealth: Payer: Self-pay | Admitting: *Deleted

## 2019-07-10 ENCOUNTER — Other Ambulatory Visit: Payer: Self-pay | Admitting: *Deleted

## 2019-07-10 DIAGNOSIS — C50211 Malignant neoplasm of upper-inner quadrant of right female breast: Secondary | ICD-10-CM

## 2019-07-10 DIAGNOSIS — Z170 Estrogen receptor positive status [ER+]: Secondary | ICD-10-CM

## 2019-07-11 ENCOUNTER — Telehealth: Payer: Self-pay

## 2019-07-11 ENCOUNTER — Telehealth: Payer: Self-pay | Admitting: Oncology

## 2019-07-11 NOTE — Telephone Encounter (Signed)
Spoke with pt by phone to inform her of appt for breast ultrasound and possible biopsy at the [facility] on Sept 16th at 1240 with 1220 arrival time.   Pt repeated appt date time back to nurse then asked when her next appt is scheduled with [name].  Pt repeated back appt date and time with [name], then started repeating "hello".  Nurse kept talking, pt kept repeating "hello".  Connection sounded good on this end but apparently pt lost ability to hear on her end.  After several attempts of nurse stating "can you hear me" and pt stating "hello" nurse hung up phone.

## 2019-07-11 NOTE — Telephone Encounter (Signed)
Called heart and vascular department to schedule appt for ECHO - unable to reach scheduler . Left message for office to call me or pt to set up appt.  Per 9/2 sch message

## 2019-07-17 ENCOUNTER — Other Ambulatory Visit: Payer: Self-pay

## 2019-07-17 ENCOUNTER — Ambulatory Visit (HOSPITAL_COMMUNITY)
Admission: RE | Admit: 2019-07-17 | Discharge: 2019-07-17 | Disposition: A | Discharge status: Home / Self Care | Payer: Medicare Other | Source: Ambulatory Visit | Attending: Oncology | Admitting: Oncology

## 2019-07-17 DIAGNOSIS — Z0189 Encounter for other specified special examinations: Secondary | ICD-10-CM | POA: Diagnosis not present

## 2019-07-17 DIAGNOSIS — Z170 Estrogen receptor positive status [ER+]: Secondary | ICD-10-CM

## 2019-07-17 DIAGNOSIS — I351 Nonrheumatic aortic (valve) insufficiency: Secondary | ICD-10-CM | POA: Insufficient documentation

## 2019-07-17 DIAGNOSIS — I10 Essential (primary) hypertension: Secondary | ICD-10-CM | POA: Diagnosis not present

## 2019-07-17 DIAGNOSIS — R079 Chest pain, unspecified: Secondary | ICD-10-CM | POA: Insufficient documentation

## 2019-07-17 DIAGNOSIS — C50211 Malignant neoplasm of upper-inner quadrant of right female breast: Secondary | ICD-10-CM | POA: Insufficient documentation

## 2019-07-17 DIAGNOSIS — I429 Cardiomyopathy, unspecified: Secondary | ICD-10-CM | POA: Insufficient documentation

## 2019-07-17 DIAGNOSIS — F17200 Nicotine dependence, unspecified, uncomplicated: Secondary | ICD-10-CM | POA: Diagnosis not present

## 2019-07-17 NOTE — Progress Notes (Signed)
  Echocardiogram 2D Echocardiogram has been performed.  Burnett Kanaris 07/17/2019, 10:33 AM

## 2019-07-24 ENCOUNTER — Other Ambulatory Visit: Payer: Medicare Other

## 2019-07-24 ENCOUNTER — Ambulatory Visit
Admission: RE | Admit: 2019-07-24 | Discharge: 2019-07-24 | Disposition: A | Discharge status: Home / Self Care | Payer: Medicare Other | Source: Ambulatory Visit | Attending: Oncology | Admitting: Oncology

## 2019-07-24 ENCOUNTER — Other Ambulatory Visit: Payer: Self-pay | Admitting: Oncology

## 2019-07-24 ENCOUNTER — Other Ambulatory Visit: Payer: Self-pay | Admitting: Diagnostic Radiology

## 2019-07-24 ENCOUNTER — Other Ambulatory Visit: Payer: Self-pay

## 2019-07-24 DIAGNOSIS — Z170 Estrogen receptor positive status [ER+]: Secondary | ICD-10-CM

## 2019-07-24 DIAGNOSIS — C50211 Malignant neoplasm of upper-inner quadrant of right female breast: Secondary | ICD-10-CM

## 2019-07-24 DIAGNOSIS — C4911 Malignant neoplasm of connective and soft tissue of right upper limb, including shoulder: Secondary | ICD-10-CM

## 2019-07-25 ENCOUNTER — Other Ambulatory Visit: Payer: Self-pay | Admitting: *Deleted

## 2019-07-25 DIAGNOSIS — G629 Polyneuropathy, unspecified: Secondary | ICD-10-CM

## 2019-07-25 MED ORDER — GABAPENTIN 300 MG PO CAPS: 300.0000 mg | ORAL_CAPSULE | Freq: Two times a day (BID) | ORAL | 3 refills | Status: DC | PRN

## 2019-07-29 NOTE — Progress Notes (Signed)
[place]  Telephone:([phone]) [phone] [phone]) [phone]     ID: [name]   DOB: [birth year]  MR#: [id number]  BOF#:[id number]  Patient Care Team: Patient, No Pcp Per as PCP - General (General Practice) Rollo Farquhar, [name], MD as Consulting Physician (Oncology) [name], MD as Consulting Physician (Radiation Oncology) [name], [name], MD as Consulting Physician (Cardiology) [name], [name] 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 She was last seen here on 07/02/2019.   She continues on abraxane.  Currently she denies peripheral neuropathy although in the past she has told me she has had mild peripheral neuropathy symptoms.  She also continues on  trastuzumab and pertuzumab, which she receives every 28 days. Charae's last echocardiogram on 07/17/2019, showed an ejection fraction in the 55% - 60% range.   Since her last visit here, she underwent an ultrasound of the left axilla on 07/24/2019 showing: Enlarged left axillary lymph node is suspicious for recurrent malignancy. An irregular mass in the lateral, posterior left breast which may be located in the inferolateral pectoralis muscle or the left serratus anterior muscle and is suspicious for malignancy.  A biopsy was performed of the axillary lymph node in question on 07/24/2019. The pathology from this procedure showed (SAA 20, 04/13/2006) a benign lymph node with no malignancy identified.  REVIEW OF SYSTEMS: Kalii tells me that she and her family are taking appropriate pandemic precautions.  She says the doctor who did her ultrasound told her the mass in her breast looks like fluid or  scar tissue.  She does not want an MRI but would be interested in a PET scan.  He tells me that she did not like the fact that she showed up for an ultrasound and ended up with a biopsy.  She wanted to make sure that I tell her in the future what ever procedure she is going to have.  She denies any change in pain level, problems with bowel movements, cough phlegm production or pleurisy, unusual headaches visual changes nausea or vomiting.  There have been no intercurrent falls.  A detailed review of systems today was otherwise stable    BREAST CANCER HISTORY: From the original intake note:  The patient originally presented in May 2003 when she noticed a lump in the upper inner quadrant of her right breast in September 2003.  She sought attention and had a mammogram which showed an obvious carcinoma in the upper inner quadrant of the right breast, approximately 2 cm.  There were some enlarged lymph nodes in the axilla and an FNA done showed those consistent with malignant cells, most likely an invasive ductal carcinoma.  At that point she was unsure of what to do and was referred by Dr. [name] for a discussion of her treatment options.  By biopsy, it was ER/PR negative and HER-2 was 3+.  DNA index was 1.42.  We reiterated that it was most important to have her disease surgically addressed at that point and had recommended lumpectomy and axillary nodes to be addressed with which she followed through and on 04-03-02, Dr. [name] performed a right partial mastectomy and a right axillary lymph node dissection.  Final pathology revealed a 2.4 cm. high grade,  Grade III invasive ductal carcinoma with an adjacent .8 cm. also high grade invasive ductal carcinoma which was felt to represent an intramammary metastasis rather than a second primary.  A smaller mass was just medial to the larger mass.  The smaller mass was associated with high grade DCIS component.  There was no definite lymphovascular invasion  identified.  However, one of fourteen axillary lymph nodes did contain a 1.5 cm. metastatic deposit.  Postoperatively she did very well.  We reiterated the fact that she did need adjuvant chemotherapy, however, she refused and decided that she would pursue radiation.  She received radiation and completed that on 08-14-02. Her subsequent history is as detailed below.   PAST MEDICAL HISTORY: Past Medical History:  Diagnosis Date   Breast cancer ([place])    DVT (deep venous thrombosis) ([place]) 10/07/2011   Fever 02/06/2018   Hypertension    Radiation 11/29/2005-12/29/2005   right supraclavicular area 4147 cGy   Radiation 06/26/02-08/14/02   right breast 5040 cGy, tumor bed boosted to 1260 cGy   Rheumatic fever     PAST SURGICAL HISTORY: Past Surgical History:  Procedure Laterality Date   BREAST SURGERY     MASTECTOMY Bilateral    PORT A CATH REVISION      FAMILY HISTORY Family History  Problem Relation Age of Onset   Pancreatic cancer Mother    Kidney cancer Sister 82   Breast cancer Sister 63   Breast cancer Other 69  The patient's father died in his 57Q  from complications of alcohol abuse. The patient's mother died in her 48s from pancreatic cancer. The patient has 6 brothers, 2 sisters. Both her sisters have had breast cancer, both diagnosed after the age of 66. The patient has not had genetic testing so far.   GYNECOLOGIC HISTORY: Menarche age 33; first live birth age 23. She is GXP4. She is not sure when she stopped having periods. She never used hormone replacement.   SOCIAL HISTORY: The patient has worked in the past as a Psychologist, counselling. At home in addition to the patient are her husband [name], originally from [place], who works as a Administrator.  Also living in the home is her daughter [name] (she is studying to be a Marine scientist) and grandson.  The other 2 children are [name], who has a college degree in psychology and works as a Probation officer; and [name], who lives in  [place] and works as a Higher education careers adviser. Son [name] also sometimes stays in the home. In addition the patient has an aide who helps her almost daily.   ADVANCED DIRECTIVES: Not in place   HEALTH MAINTENANCE: Social History   Tobacco Use   Smoking status: Light Tobacco Smoker   Smokeless tobacco: Never Used  Substance Use Topics   Alcohol use: Yes    Comment: Rarely   Drug use: No     Colonoscopy:  Not on file  PAP:  Not on file  Bone density:  Not on file  Lipid panel:  Not on file    Allergies  Allergen Reactions   Pork-Derived Products Other (See Comments)    Pt says she just doesn't eat pork; has no reaction to pork products   Tramadol Nausea Only   Hydrocodone-Acetaminophen Itching and Rash    Current Outpatient Medications  Medication Sig Dispense Refill   ALPRAZolam (XANAX) 0.5 MG tablet Take 1 tablet (0.5 mg total) by mouth 2 (two) times daily as needed for anxiety. 45 tablet 0   Ascorbic Acid (  VITAMIN C) 100 MG tablet Take 100 mg by mouth daily.       cholecalciferol (VITAMIN D) 1000 units tablet Take 1 tablet (1,000 Units total) by mouth daily. 90 tablet 4   gabapentin (NEURONTIN) 300 MG capsule Take 1 capsule (300 mg total) by mouth 2 (two) times daily as needed. 60 capsule 3   hydrOXYzine (ATARAX/VISTARIL) 25 MG tablet TAKE 1 TABLET BY MOUTH THREE TIMES A DAY AS NEEDED 270 tablet 1   magic mouthwash SOLN SWISH AND SPIT OR SWALLOW WITH 5MLS BEFORE MEALS AND AT BEDTIME 240 mL 1   methadone (DOLOPHINE) 10 MG tablet Take 10 mg by mouth 5 (five) times daily.  0   Multiple Vitamin (MULTIVITAMIN) capsule Take 1 capsule by mouth daily.       potassium chloride (MICRO-K) 10 MEQ CR capsule Take 2 capsules (20 mEq total) by mouth daily. 60 capsule 1   prochlorperazine (COMPAZINE) 10 MG tablet Take 1 tablet (10 mg total) by mouth every 6 (six) hours as needed for nausea or vomiting. 30 tablet 0   triamterene-hydrochlorothiazide (DYAZIDE) 37.5-25 MG capsule Take  1 each (1 capsule total) by mouth every morning. 30 capsule 3   No current facility-administered medications for this visit.    Facility-Administered Medications Ordered in Other Visits  Medication Dose Route Frequency Provider Last Rate Last Dose   heparin lock flush 100 unit/mL  500 Units Intracatheter Once PRN Leighann Amadon, [name], MD       PACLitaxel-protein bound (ABRAXANE) chemo infusion 200 mg  100 mg/m2 (Treatment Plan Recorded) Intravenous Once Jalene Mullet, RPH 80 mL/hr at 07/30/19 1546 200 mg at 07/30/19 1546   sodium chloride 0.9 % injection 10 mL  10 mL Intravenous PRN Julion Gatt, [name], MD   10 mL at 03/04/14 1421   sodium chloride flush (NS) 0.9 % injection 10 mL  10 mL Intracatheter PRN Jozelyn Kuwahara, [name], MD        OBJECTIVE: Middle-aged African-American woman who appears stated age  Vitals:   07/30/19 1145  BP: (!) 143/86  Pulse: 74  Resp: 18  Temp: 98.9 F (37.2 C)  SpO2: 99%   Wt Readings from Last 3 Encounters:  07/30/19 202 lb 12.8 oz (92 kg)  07/02/19 204 lb 3 oz (92.6 kg)  04/09/19 206 lb (93.4 kg)   Body mass index is 33.75 kg/m.    ECOG FS:1 - Symptomatic but completely ambulatory  Ocular: Sclerae unicteric, pupils round and equal Ear-nose-throat: Wearing a mask Lungs no rales or rhonchi Heart regular rate and rhythm Abd soft, nontender, positive bowel sounds MSK no focal spinal tenderness, no joint edema Neuro: non-focal, well-oriented, appropriate affect Breasts: She is status post bilateral mastectomy. Skin: Her lesions along the back and right arm show continuing response  Photo 07/04/2017     Photo 08/23/2018   Photo 12/19/2018     Photo 01/16/2019    LAB RESULTS:.  Lab Results  Component Value Date   WBC 6.0 07/30/2019   NEUTROABS 3.6 07/30/2019   HGB 10.9 (L) 07/30/2019   HCT 35.3 (L) 07/30/2019   MCV 84.7 07/30/2019   PLT 206 07/30/2019      Chemistry      Component Value Date/Time   NA 140 07/30/2019  1115   NA 142 11/09/2017 1214   K 4.1 07/30/2019 1115   K 3.9 11/09/2017 1214   CL 100 07/30/2019 1115   CL 101 03/07/2013 1411   CO2 30 07/30/2019 1115   CO2 32 (  H) 11/09/2017 1214   BUN 27 (H) 07/30/2019 1115   BUN 19.1 11/09/2017 1214   CREATININE 1.05 (H) 07/30/2019 1115   CREATININE 1.0 11/09/2017 1214      Component Value Date/Time   CALCIUM 9.4 07/30/2019 1115   CALCIUM 9.1 11/09/2017 1214   ALKPHOS 132 (H) 07/30/2019 1115   ALKPHOS 120 11/09/2017 1214   AST 27 07/30/2019 1115   AST 30 11/09/2017 1214   ALT 17 07/30/2019 1115   ALT 19 11/09/2017 1214   BILITOT <0.2 (L) 07/30/2019 1115   BILITOT <0.22 11/09/2017 1214       STUDIES: [name] Axillary Node Core Biopsy Left  Result Date: 07/24/2019 CLINICAL DATA:  Patient with thickened left axillary lymph node. EXAM: [name] AXILLARY NODE CORE BIOPSY LEFT COMPARISON:  Previous exam(s). FINDINGS: I met with the patient and we discussed the procedure of ultrasound-guided biopsy, including benefits and alternatives. We discussed the high likelihood of a successful procedure. We discussed the risks of the procedure, including infection, bleeding, tissue injury, clip migration, and inadequate sampling. Informed written consent was given. The usual time-out protocol was performed immediately prior to the procedure. Using sterile technique and 1% Lidocaine as local anesthetic, under direct ultrasound visualization, a 14 gauge spring-loaded device was used to perform biopsy of thickened left axillary lymph node using a lateral approach. At the conclusion of the procedure a HydroMARK tissue marker clip was deployed into the biopsy cavity. Follow up 2 view mammogram was performed and dictated separately. IMPRESSION: Ultrasound guided biopsy of thickened left axillary lymph node. No apparent complications. Electronically Signed   By: Lovey Newcomer [name]   On: 07/24/2019 16:27   [name] Axilla Left  Result Date: 07/24/2019 CLINICAL DATA:  Female here for  evaluation of a palpable area concern in the left axilla felt by the patient's clinician. The patient has a history of right invasive ductal carcinoma stage IV with multiple instances of recurrences, ultimately undergoing right breast mastectomy in 2005. Patient has history of left breast ductal carcinoma in situ status post mastectomy in 2007. Patient also has a history of right upper extremity angiosarcoma diagnosed in 2016. EXAM: ULTRASOUND OF THE LEFT AXILLA COMPARISON:  PET-CT dated 02/25/2014 and CT chest dated 05/03/2018. FINDINGS: Targeted left axillary ultrasound was performed by the physician in the area of concern indicated by the patient. An abnormally enlarged lymph node in the low left axilla measures 1.7 cm in greatest dimension. This likely does not correspond to the palpable area of concern. There is subcutaneous edema in the upper outer left breast which spans approximately 11 cm and is centered at the 1 o'clock position 10 cm from the nipple. This area is firm and nonmobile, likely corresponding to the palpable area felt by the patient's clinician. Inferior to this area is an irregular indistinct hypoechoic mass at 2 o'clock 8 cm from the nipple measuring 1.6 x 0.9 x 2.0 cm. This is near the patient's operative site from prior mastectomy and precise anatomical delineation of his location is difficult, but may be located within the inferolateral pectoralis muscle or the left serratus anterior muscle. This mass is located deep beneath the scan and is less amenable to biopsy than the left axillary lymph node. IMPRESSION: Enlarged left axillary lymph node is suspicious for recurrent malignancy. An irregular mass in the lateral, posterior left breast which may be located in the inferolateral pectoralis muscle or the left serratus anterior muscle and is suspicious for malignancy. RECOMMENDATION: Recommend ultrasound-guided biopsy of the enlarged  left axillary lymph node. Pending biopsy results,  additional imaging such as breast MR or PET-CT could be considered for further evaluation of the irregular mass in the lateral left breast given that this area is difficult to assess by ultrasound. I have discussed the findings and recommendations with the patient. If applicable, a reminder letter will be sent to the patient regarding the next appointment. BI-RADS CATEGORY  4: Suspicious. Electronically Signed   By: Zerita Boers [name]   On: 07/24/2019 15:13     ASSESSMENT:  63 y.o. [place] woman with stage IV breast cancer manifested chiefly by loco-regional nodal disease (neck, chest) and skin involvement, without liver, bone, or brain metastases documented   (1) Status post right upper inner quadrant lumpectomy and sentinel lymph node sampling 03/04/2002 for 2 separate foci of invasive ductal carcinoma, mpT2 pN1 or stage IIB, both foci grade 3, both estrogen and progesterone receptor positive, both HercepTest 3+, Mib-1 56%  (2) Reexcision for margins 05/27/2002 showed no residual cancer in the breast.  (3) The patient refused adjuvant systemic therapy.  (4) Adjuvant radiation treatment completed 08/14/2002.  (5) recurrence in the right breast in 02/2004 showing a morphologically different tumor, again grade 3, again estrogen and progesterone receptor negative, with an MIB-1 of 14% and Herceptest 3+.  (5) Between 03/2004 and 07/2004 she received dose dense Doxorubicin/Cyclophosphamide x 4 given with trastuzumab, followed by weekly Docetaxel x 8, again given with trastuzumab.  (6) Right mastectomy 07/13/2004 showed scattered microscopic foci of residual disease over an area  greater than 5 cm. Margins were negative.  (7) Postoperative Docetaxel continued until 09/2004.  (8) Trastuzumab (Herceptin) given 08/2004 through 01/2012 with some brief interruptions.  RECURRENT/ STAGE IV DISEASE December 2006 (9) Isolated right cervical nodal recurrence 10/2005, treated with radiation to the right  supraclavicular area (total 41.5 gray) completed 12/29/2005.  (10) Navelbine given together with Herceptin  11/2005 through 03/2006.  (9) Left mastectomy 02/13/2006 for ductal carcinoma in situ, grade 2, estrogen and progesterone receptor negative, with negative margins; 0 of 3 lymph nodes involved  (10) treated with Lapatinib and Capecitabine before 10/2009, for an unclear duration and with unclear results (cannot locate data on chart review).  (11) Status post right supraclavicular lymph node biopsy 09/2010 again positive for an invasive ductal carcinoma, estrogen and progesterone receptor negative, HER-2 positive by C[facility] with a ratio 4.25.  (12) Navelbine given together with Herceptin between 05/2011 and 11/2011.  (13) Carboplatin/ Gemcitabine/ Herceptin given for 2 cycles, in 12/2011 and 01/2012.  (14) TDM-1 ([place]) started 02/2012. Last dose 10/02/2013 after which the patient discontinued treatment at her own discretion. Echo on December 2014 showed a well preserved ejection fraction.  (15) Deep vein thrombosis of the right upper extremity documented 04/20/2011.  She completed anticoagulant therapy with Coumadin on 03/25/2013.  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 Left chest Port-A-Cath placed on 04/08/2013; being flushed every 6 weeks  RIGHT UPPER EXTREMITY ANGIOSARCOMA VS BREAST CANCER: August 2016 (18) treated at [facility] of [place] August 2016 with paclitaxel, trastuzumab and pertuzumab 1 cycle, afterwards referred to hospice  (19) under the care of hospice of [facility] August 2016 to 05/08/2016, when the patient opted for a second try at chemotherapy  (20) started low-dose Abraxane, trastuzumab and pertuzumab 06/07/2016, to be  repeated every 4 weeks.   (a) pretreatment echocardiogram 06/06/2016 showed a 60-65% ejection fraction  (b) significant compliance problems compromise response to treatment  (c)  echocardiogram 02/20/17 LVEF 55-60%  (d) Abraxane discontinued 07/04/2017 because of possible neuropathy symptoms  (e) Abraxane resumed 09/27/2017  (f) echocardiogram 07/20/2017 showed an ejection fraction of 60-65%  (g) echo 01/22/2018 showed an ejection fraction of 50-55%  (h) CT scan of the chest 05/03/2018 shows no disease involving the lungs liver or lymph nodes, with stable subcutaneous masses as previously noted  (I) Abraxane discontinued August 2019 because of concerns regarding neuropathy-- gemcitabine substituted  (j) echocardiogram 08/20/2018 shows an ejection fraction in the 55-60% range (done every 6 months)  (k) gemcitabine discontinued because of multiple symptoms, Abraxane restarted 09/11/2018, given once every 4 weeks  (l) echocardiogram 07/17/2019 shows a well-preserved ejection fraction at 55-60%  (21) Chronic pain secondary to known metastatic disease  (a) patient signed pain contract 10/19/2016  (b) as of 11/16/2016 receives her pain medications from Dr [name]  (c)  Dr [name] no longer able to prescribe as of February 2019  (d) all pain medications now through [facility] (as of April 2019)  (22) left breast and left axillary adenopathy noted clinically  (a) left axillary lymph node biopsy on 07/24/2019 benign  (b) left breast mass   PLAN: Emaley is now a little over 4 years out since definitive diagnosis of right upper extremity angiosarcoma, metastatic to the back and chest wall.  Her disease is well controlled on her current treatment, which we are continuing indefinitely.  She just had an echocardiogram which shows a well-preserved ejection fraction.  Currently she denies peripheral neuropathy.  I have alerted her that if symptoms develop she absolutely must let us know  since otherwise she could become disabled.  She does have a mass in the left chest wall area which bears further imaging.  She did not want to go for MRI.  We are setting her up for a PET scan.  That will serve [name] also as a new baseline for future reference  She will return in 1 month for her next treatment.  She knows to call for any other issue that may develop before that visit.  Danelia Snodgrass, [name], MD  07/30/19 3:49 PM Medical Oncology and Hematology [facility] [place], [place] [address] Tel. [phone]    Fax. [phone]  I, Jacqualyn Posey am acting as a Education administrator for Chauncey Cruel, MD.   I, Lurline Del MD, have reviewed the above documentation for accuracy and completeness, and I agree with the above.

## 2019-07-30 ENCOUNTER — Inpatient Hospital Stay: Payer: Medicare Other

## 2019-07-30 ENCOUNTER — Inpatient Hospital Stay: Payer: Medicare Other | Attending: Oncology

## 2019-07-30 ENCOUNTER — Inpatient Hospital Stay (HOSPITAL_BASED_OUTPATIENT_CLINIC_OR_DEPARTMENT_OTHER): Payer: Medicare Other | Admitting: Oncology

## 2019-07-30 ENCOUNTER — Other Ambulatory Visit: Payer: Self-pay

## 2019-07-30 VITALS — BP 143/86 | HR 74 | Temp 98.9°F | Resp 18 | Ht 65.0 in | Wt 202.8 lb

## 2019-07-30 DIAGNOSIS — Z170 Estrogen receptor positive status [ER+]: Secondary | ICD-10-CM | POA: Diagnosis not present

## 2019-07-30 DIAGNOSIS — C4911 Malignant neoplasm of connective and soft tissue of right upper limb, including shoulder: Secondary | ICD-10-CM

## 2019-07-30 DIAGNOSIS — E8989 Other postprocedural endocrine and metabolic complications and disorders: Secondary | ICD-10-CM | POA: Diagnosis not present

## 2019-07-30 DIAGNOSIS — C50211 Malignant neoplasm of upper-inner quadrant of right female breast: Secondary | ICD-10-CM | POA: Insufficient documentation

## 2019-07-30 DIAGNOSIS — Z923 Personal history of irradiation: Secondary | ICD-10-CM | POA: Diagnosis not present

## 2019-07-30 DIAGNOSIS — F17210 Nicotine dependence, cigarettes, uncomplicated: Secondary | ICD-10-CM | POA: Diagnosis not present

## 2019-07-30 DIAGNOSIS — Z23 Encounter for immunization: Secondary | ICD-10-CM

## 2019-07-30 DIAGNOSIS — Z5111 Encounter for antineoplastic chemotherapy: Secondary | ICD-10-CM | POA: Insufficient documentation

## 2019-07-30 DIAGNOSIS — Z95828 Presence of other vascular implants and grafts: Secondary | ICD-10-CM

## 2019-07-30 DIAGNOSIS — Z5112 Encounter for antineoplastic immunotherapy: Secondary | ICD-10-CM | POA: Insufficient documentation

## 2019-07-30 DIAGNOSIS — I10 Essential (primary) hypertension: Secondary | ICD-10-CM

## 2019-07-30 DIAGNOSIS — I82403 Acute embolism and thrombosis of unspecified deep veins of lower extremity, bilateral: Secondary | ICD-10-CM

## 2019-07-30 DIAGNOSIS — I890 Lymphedema, not elsewhere classified: Secondary | ICD-10-CM

## 2019-07-30 LAB — CBC WITH DIFFERENTIAL (CANCER CENTER ONLY)
Abs Immature Granulocytes: 0.03 10*3/uL (ref 0.00–0.07)
Basophils Absolute: 0 10*3/uL (ref 0.0–0.1)
Basophils Relative: 0 %
Eosinophils Absolute: 0.2 10*3/uL (ref 0.0–0.5)
Eosinophils Relative: 3 %
HCT: 35.3 % — ABNORMAL LOW (ref 36.0–46.0)
Hemoglobin: 10.9 g/dL — ABNORMAL LOW (ref 12.0–15.0)
Immature Granulocytes: 1 %
Lymphocytes Relative: 23 %
Lymphs Abs: 1.4 10*3/uL (ref 0.7–4.0)
MCH: 26.1 pg (ref 26.0–34.0)
MCHC: 30.9 g/dL (ref 30.0–36.0)
MCV: 84.7 fL (ref 80.0–100.0)
Monocytes Absolute: 0.8 10*3/uL (ref 0.1–1.0)
Monocytes Relative: 13 %
Neutro Abs: 3.6 10*3/uL (ref 1.7–7.7)
Neutrophils Relative %: 60 %
Platelet Count: 206 10*3/uL (ref 150–400)
RBC: 4.17 MIL/uL (ref 3.87–5.11)
RDW: 15.8 % — ABNORMAL HIGH (ref 11.5–15.5)
WBC Count: 6 10*3/uL (ref 4.0–10.5)
nRBC: 0 % (ref 0.0–0.2)

## 2019-07-30 LAB — CMP (CANCER CENTER ONLY)
ALT: 17 U/L (ref 0–44)
AST: 27 U/L (ref 15–41)
Albumin: 3.9 g/dL (ref 3.5–5.0)
Alkaline Phosphatase: 132 U/L — ABNORMAL HIGH (ref 38–126)
Anion gap: 10 (ref 5–15)
BUN: 27 mg/dL — ABNORMAL HIGH (ref 8–23)
CO2: 30 mmol/L (ref 22–32)
Calcium: 9.4 mg/dL (ref 8.9–10.3)
Chloride: 100 mmol/L (ref 98–111)
Creatinine: 1.05 mg/dL — ABNORMAL HIGH (ref 0.44–1.00)
GFR, Est AFR Am: >60 mL/min (ref >60)
GFR, Estimated: 56 mL/min — ABNORMAL LOW (ref >60)
Glucose, Bld: 99 mg/dL (ref 70–99)
Potassium: 4.1 mmol/L (ref 3.5–5.1)
Sodium: 140 mmol/L (ref 135–145)
Total Bilirubin: <0.2 mg/dL — ABNORMAL LOW (ref 0.3–1.2)
Total Protein: 8.2 g/dL — ABNORMAL HIGH (ref 6.5–8.1)

## 2019-07-30 MED ORDER — INFLUENZA VAC SPLIT QUAD 0.5 ML IM SUSY
0.5000 mL | PREFILLED_SYRINGE | Freq: Once | INTRAMUSCULAR | Status: AC
  Administered 2019-07-30: 14:00:00 0.5 mL via INTRAMUSCULAR

## 2019-07-30 MED ORDER — ACETAMINOPHEN 325 MG PO TABS
650.0000 mg | ORAL_TABLET | Freq: Once | ORAL | Status: AC
  Administered 2019-07-30: 650 mg via ORAL

## 2019-07-30 MED ORDER — TRASTUZUMAB-ANNS CHEMO 150 MG IV SOLR
6.0000 mg/kg | Freq: Once | INTRAVENOUS | Status: AC
  Administered 2019-07-30: 525 mg via INTRAVENOUS
  Filled 2019-07-30: qty 25

## 2019-07-30 MED ORDER — DIPHENHYDRAMINE HCL 25 MG PO CAPS
25.0000 mg | ORAL_CAPSULE | Freq: Once | ORAL | Status: AC
  Administered 2019-07-30: 25 mg via ORAL

## 2019-07-30 MED ORDER — INFLUENZA VAC SPLIT QUAD 0.5 ML IM SUSY
PREFILLED_SYRINGE | INTRAMUSCULAR | Status: AC
  Filled 2019-07-30: qty 0.5

## 2019-07-30 MED ORDER — PROCHLORPERAZINE MALEATE 10 MG PO TABS
10.0000 mg | ORAL_TABLET | Freq: Once | ORAL | Status: AC
  Administered 2019-07-30: 10 mg via ORAL

## 2019-07-30 MED ORDER — DIPHENHYDRAMINE HCL 25 MG PO CAPS
ORAL_CAPSULE | ORAL | Status: AC
  Filled 2019-07-30: qty 1

## 2019-07-30 MED ORDER — SODIUM CHLORIDE 0.9 % IV SOLN
420.0000 mg | Freq: Once | INTRAVENOUS | Status: AC
  Administered 2019-07-30: 420 mg via INTRAVENOUS
  Filled 2019-07-30: qty 14

## 2019-07-30 MED ORDER — PROCHLORPERAZINE MALEATE 10 MG PO TABS
ORAL_TABLET | ORAL | Status: AC
  Filled 2019-07-30: qty 1

## 2019-07-30 MED ORDER — SODIUM CHLORIDE 0.9 % IV SOLN
Freq: Once | INTRAVENOUS | Status: AC
  Administered 2019-07-30: 12:00:00 via INTRAVENOUS
  Filled 2019-07-30: qty 250

## 2019-07-30 MED ORDER — SODIUM CHLORIDE 0.9% FLUSH
10.0000 mL | Freq: Once | INTRAVENOUS | Status: AC
  Administered 2019-07-30: 10 mL
  Filled 2019-07-30: qty 10

## 2019-07-30 MED ORDER — PACLITAXEL PROTEIN-BOUND CHEMO INJECTION 100 MG
100.0000 mg/m2 | Freq: Once | INTRAVENOUS | Status: AC
  Administered 2019-07-30: 200 mg via INTRAVENOUS
  Filled 2019-07-30: qty 40

## 2019-07-30 MED ORDER — ACETAMINOPHEN 325 MG PO TABS
ORAL_TABLET | ORAL | Status: AC
  Filled 2019-07-30: qty 2

## 2019-07-30 MED ORDER — HEPARIN SOD (PORK) LOCK FLUSH 100 UNIT/ML IV SOLN
500.0000 [IU] | Freq: Once | INTRAVENOUS | Status: AC | PRN
  Administered 2019-07-30: 500 [IU]
  Filled 2019-07-30: qty 5

## 2019-07-30 MED ORDER — TRIAMTERENE-HCTZ 37.5-25 MG PO CAPS: 1.0000 | ORAL_CAPSULE | Freq: Every morning | ORAL | 3 refills | Status: DC

## 2019-07-30 MED ORDER — SODIUM CHLORIDE 0.9% FLUSH
10.0000 mL | INTRAVENOUS | Status: AC | PRN
  Administered 2019-07-30: 10 mL
  Filled 2019-07-30: qty 10

## 2019-07-30 NOTE — Patient Instructions (Signed)

## 2019-07-30 NOTE — Patient Instructions (Signed)
[facility] Discharge Instructions for Patients Receiving Chemotherapy  Today you received the following chemotherapy agents: trastuzumab, pertuzumab, and nab-paclitaxel.  To help prevent nausea and vomiting after your treatment, we encourage you to take your nausea medication as directed.   If you develop nausea and vomiting that is not controlled by your nausea medication, call the clinic.   BELOW ARE SYMPTOMS THAT SHOULD BE REPORTED IMMEDIATELY:  *FEVER GREATER THAN 100.5 F  *CHILLS WITH OR WITHOUT FEVER  NAUSEA AND VOMITING THAT IS NOT CONTROLLED WITH YOUR NAUSEA MEDICATION  *UNUSUAL SHORTNESS OF BREATH  *UNUSUAL BRUISING OR BLEEDING  TENDERNESS IN MOUTH AND THROAT WITH OR WITHOUT PRESENCE OF ULCERS  *URINARY PROBLEMS  *BOWEL PROBLEMS  UNUSUAL RASH Items with * indicate a potential emergency and should be followed up as soon as possible.  Feel free to call the clinic should you have any questions or concerns. The clinic phone number is [phone].  Please show the [place] at check-in to the Emergency Department and triage nurse.

## 2019-08-16 ENCOUNTER — Other Ambulatory Visit: Payer: Self-pay | Admitting: *Deleted

## 2019-08-16 ENCOUNTER — Encounter (HOSPITAL_COMMUNITY): Payer: Medicare Other

## 2019-08-16 MED ORDER — POTASSIUM CHLORIDE ER 10 MEQ PO CPCR: 20.0000 meq | ORAL_CAPSULE | Freq: Every day | ORAL | 1 refills | Status: DC

## 2019-08-20 ENCOUNTER — Ambulatory Visit (HOSPITAL_COMMUNITY)
Admission: RE | Admit: 2019-08-20 | Discharge: 2019-08-20 | Disposition: A | Discharge status: Home / Self Care | Payer: Medicare Other | Source: Ambulatory Visit | Attending: Oncology | Admitting: Oncology

## 2019-08-20 ENCOUNTER — Ambulatory Visit (HOSPITAL_COMMUNITY): Admission: RE | Admit: 2019-08-20 | Payer: Medicare Other | Source: Ambulatory Visit

## 2019-08-20 ENCOUNTER — Other Ambulatory Visit: Payer: Self-pay

## 2019-08-20 DIAGNOSIS — I890 Lymphedema, not elsewhere classified: Secondary | ICD-10-CM | POA: Diagnosis not present

## 2019-08-20 DIAGNOSIS — I700 Atherosclerosis of aorta: Secondary | ICD-10-CM | POA: Insufficient documentation

## 2019-08-20 DIAGNOSIS — Z79899 Other long term (current) drug therapy: Secondary | ICD-10-CM | POA: Insufficient documentation

## 2019-08-20 DIAGNOSIS — I10 Essential (primary) hypertension: Secondary | ICD-10-CM | POA: Diagnosis not present

## 2019-08-20 DIAGNOSIS — Z170 Estrogen receptor positive status [ER+]: Secondary | ICD-10-CM | POA: Diagnosis present

## 2019-08-20 DIAGNOSIS — I82403 Acute embolism and thrombosis of unspecified deep veins of lower extremity, bilateral: Secondary | ICD-10-CM | POA: Insufficient documentation

## 2019-08-20 DIAGNOSIS — N8110 Cystocele, unspecified: Secondary | ICD-10-CM | POA: Insufficient documentation

## 2019-08-20 DIAGNOSIS — E8989 Other postprocedural endocrine and metabolic complications and disorders: Secondary | ICD-10-CM

## 2019-08-20 DIAGNOSIS — C50211 Malignant neoplasm of upper-inner quadrant of right female breast: Secondary | ICD-10-CM | POA: Diagnosis present

## 2019-08-20 DIAGNOSIS — C4911 Malignant neoplasm of connective and soft tissue of right upper limb, including shoulder: Secondary | ICD-10-CM

## 2019-08-20 LAB — GLUCOSE, CAPILLARY: Glucose-Capillary: 100 mg/dL — ABNORMAL HIGH (ref 70–99)

## 2019-08-20 MED ORDER — FLUDEOXYGLUCOSE F - 18 (FDG) INJECTION
9.3600 | Freq: Once | INTRAVENOUS | Status: AC | PRN
  Administered 2019-08-20: 9.36 via INTRAVENOUS

## 2019-08-27 ENCOUNTER — Inpatient Hospital Stay: Payer: Medicare Other

## 2019-08-27 ENCOUNTER — Inpatient Hospital Stay: Payer: Medicare Other | Attending: Oncology

## 2019-08-27 ENCOUNTER — Other Ambulatory Visit: Payer: Medicare Other

## 2019-08-27 ENCOUNTER — Inpatient Hospital Stay: Payer: Medicare Other | Admitting: Adult Health

## 2019-08-28 ENCOUNTER — Telehealth: Payer: Self-pay | Admitting: Adult Health

## 2019-08-28 NOTE — Telephone Encounter (Signed)
Called pt per 10/20 sch message - pt is aware of appt date and time

## 2019-08-29 ENCOUNTER — Inpatient Hospital Stay: Payer: Medicare Other | Admitting: Adult Health

## 2019-08-29 ENCOUNTER — Inpatient Hospital Stay: Payer: Medicare Other

## 2019-08-29 ENCOUNTER — Telehealth: Payer: Self-pay

## 2019-08-29 ENCOUNTER — Other Ambulatory Visit: Payer: Self-pay | Admitting: Oncology

## 2019-08-29 ENCOUNTER — Telehealth: Payer: Self-pay | Admitting: Oncology

## 2019-08-29 DIAGNOSIS — C4911 Malignant neoplasm of connective and soft tissue of right upper limb, including shoulder: Secondary | ICD-10-CM

## 2019-08-29 DIAGNOSIS — Z170 Estrogen receptor positive status [ER+]: Secondary | ICD-10-CM

## 2019-08-29 DIAGNOSIS — C50211 Malignant neoplasm of upper-inner quadrant of right female breast: Secondary | ICD-10-CM

## 2019-08-29 NOTE — Progress Notes (Signed)
I called [name] and gave her the results of her PET scan which show an intracranial mass.  We do need an MRI of the brain to evaluate this further and she is agreeable to it.

## 2019-08-29 NOTE — Telephone Encounter (Signed)
Called patient to inquire about missing appointments today.  Patient stated "I already called in to cancel them.  I want to see Dr. Jana Hakim only."  Nurse explained that she should come in to get her treatment that it is important but patient stated I'm not coming on today".  Schedule message was sent to reach out to patient for reschedule.

## 2019-08-29 NOTE — Progress Notes (Deleted)
[place]  [phone] [phone]) [phone]     ID: [name]   DOB: 05-23-1956  MR#: [id number]  DXA#:[id number]  Patient Care Team: Patient, No Pcp Per as PCP - General (General Practice) [name], [name] Dad, [name] as Consulting Physician (Oncology) [name] as Consulting Physician (Radiation Oncology) [name] as Consulting Physician (Cardiology) [name]. 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 She was last seen here on 07/02/2019.   She continues on abraxane.  Currently she denies peripheral neuropathy although in the past she has told me she has had mild peripheral neuropathy symptoms.  She also continues on  trastuzumab and pertuzumab, which she receives every 28 days. Zykira's last echocardiogram on 07/17/2019, showed an ejection fraction in the 55% - 60% range.   Since her last visit here, she underwent an ultrasound of the left axilla on 07/24/2019 showing: Enlarged left axillary lymph node is suspicious for recurrent malignancy. An irregular mass in the lateral, posterior left breast which may be located in the inferolateral pectoralis muscle or the left serratus anterior muscle and is suspicious for malignancy.  A biopsy was performed of the axillary lymph node in question on 07/24/2019. The pathology from this procedure showed (SAA 20, 04/13/2006) a benign lymph node with no malignancy identified.  REVIEW OF SYSTEMS: [name] tells me that she and her family are taking appropriate pandemic precautions.  She says the doctor who did her ultrasound told her the mass in her breast looks like fluid or  scar tissue.  She does not want an MRI but would be interested in a PET scan.  He tells me that she did not like the fact that she showed up for an ultrasound and ended up with a biopsy.  She wanted to make sure that I tell her in the future what ever procedure she is going to have.  She denies any change in pain level, problems with bowel movements, cough phlegm production or pleurisy, unusual headaches visual changes nausea or vomiting.  There have been no intercurrent falls.  A detailed review of systems today was otherwise stable    BREAST CANCER HISTORY: From the original intake note:  The patient originally presented in May 2003 when she noticed a lump in the upper inner quadrant of her right breast in September 2003.  She sought attention and had a mammogram which showed an obvious carcinoma in the upper inner quadrant of the right breast, approximately 2 cm.  There were some enlarged lymph nodes in the axilla and an FNA done showed those consistent with malignant cells, most likely an invasive ductal carcinoma.  At that point she was unsure of what to do and was referred by Dr. Marylene [name] for a discussion of her treatment options.  By biopsy, it was ER/PR negative and HER-2 was 3+.  DNA index was 1.42.  We reiterated that it was most important to have her disease surgically addressed at that point and had recommended lumpectomy and axillary nodes to be addressed with which she followed through and on 04-03-02, Dr. Annamaria [name] performed a right partial mastectomy and a right axillary lymph node dissection.  Final pathology revealed a 2.4 cm. high grade,  Grade III invasive ductal carcinoma with an adjacent .8 cm. also high grade invasive ductal carcinoma which was felt to represent an intramammary metastasis rather than a second primary.  A smaller mass was just medial to the larger mass.  The smaller mass was associated with high grade DCIS component.  There was no definite lymphovascular invasion  identified.  However, one of fourteen axillary lymph nodes did contain a 1.5 cm. metastatic deposit.  Postoperatively she did very well.  We reiterated the fact that she did need adjuvant chemotherapy, however, she refused and decided that she would pursue radiation.  She received radiation and completed that on 08-14-02. Her subsequent history is as detailed below.   PAST MEDICAL HISTORY: Past Medical History:  Diagnosis Date   Breast cancer ([place])    DVT (deep venous thrombosis) ([place]) 10/07/2011   Fever 02/06/2018   Hypertension    Radiation 11/29/2005-12/29/2005   right supraclavicular area 4147 cGy   Radiation 06/26/02-08/14/02   right breast 5040 cGy, tumor bed boosted to 1260 cGy   Rheumatic fever     PAST SURGICAL HISTORY: Past Surgical History:  Procedure Laterality Date   BREAST SURGERY     MASTECTOMY Bilateral    PORT A CATH REVISION      FAMILY HISTORY Family History  Problem Relation Age of Onset   Pancreatic cancer Mother    Kidney cancer Sister 82   Breast cancer Sister 63   Breast cancer Other 69  The patient's father died in his 57Q  from complications of alcohol abuse. The patient's mother died in her 48s from pancreatic cancer. The patient has 6 brothers, 2 sisters. Both her sisters have had breast cancer, both diagnosed after the age of 66. The patient has not had genetic testing so far.   GYNECOLOGIC HISTORY: Menarche age 33; first live birth age 23. She is GXP4. She is not sure when she stopped having periods. She never used hormone replacement.   SOCIAL HISTORY: The patient has worked in the past as a Psychologist, counselling. At home in addition to the patient are her husband [name], originally from [place], who works as a Administrator.  Also living in the home is her daughter [name] (she is studying to be a Marine scientist) and grandson.  The other 2 children are [name], who has a college degree in psychology and works as a Probation officer; and [name], who lives in  [place] and works as a Higher education careers adviser. [name] also sometimes stays in the home. In addition the patient has an aide who helps her almost daily.   ADVANCED DIRECTIVES: Not in place   HEALTH MAINTENANCE: Social History   Tobacco Use   Smoking status: Light Tobacco Smoker   Smokeless tobacco: Never Used  Substance Use Topics   Alcohol use: Yes    Comment: Rarely   Drug use: No     Colonoscopy:  Not on file  PAP:  Not on file  Bone density:  Not on file  Lipid panel:  Not on file    Allergies  Allergen Reactions   Pork-Derived Products Other (See Comments)    Pt says she just doesn't eat pork; has no reaction to pork products   Tramadol Nausea Only   Hydrocodone-Acetaminophen Itching and Rash    Current Outpatient Medications  Medication Sig Dispense Refill   ALPRAZolam (XANAX) 0.5 MG tablet Take 1 tablet (0.5 mg total) by mouth 2 (two) times daily as needed for anxiety. 45 tablet 0   Ascorbic Acid (  VITAMIN C) 100 MG tablet Take 100 mg by mouth daily.       cholecalciferol (VITAMIN D) 1000 units tablet Take 1 tablet (1,000 Units total) by mouth daily. 90 tablet 4   gabapentin (NEURONTIN) 300 MG capsule Take 1 capsule (300 mg total) by mouth 2 (two) times daily as needed. 60 capsule 3   hydrOXYzine (ATARAX/VISTARIL) 25 MG tablet TAKE 1 TABLET BY MOUTH THREE TIMES A DAY AS NEEDED 270 tablet 1   magic mouthwash SOLN SWISH AND SPIT OR SWALLOW WITH 5MLS BEFORE MEALS AND AT BEDTIME 240 mL 1   methadone (DOLOPHINE) 10 MG tablet Take 10 mg by mouth 5 (five) times daily.  0   Multiple Vitamin (MULTIVITAMIN) capsule Take 1 capsule by mouth daily.       potassium chloride (MICRO-K) 10 MEQ CR capsule Take 2 capsules (20 mEq total) by mouth daily. 60 capsule 1   prochlorperazine (COMPAZINE) 10 MG tablet Take 1 tablet (10 mg total) by mouth every 6 (six) hours as needed for nausea or vomiting. 30 tablet 0   triamterene-hydrochlorothiazide (DYAZIDE) 37.5-25 MG capsule Take  1 each (1 capsule total) by mouth every morning. 30 capsule 3   No current facility-administered medications for this visit.    Facility-Administered Medications Ordered in Other Visits  Medication Dose Route Frequency Provider Last Rate Last Dose   sodium chloride 0.9 % injection 10 mL  10 mL Intravenous PRN [name], [name] Dad, [name]   10 mL at 03/04/14 1421   sodium chloride flush (NS) 0.9 % injection 10 mL  10 mL Intracatheter PRN [name], [name] Dad, [name]   10 mL at 07/30/19 1627    OBJECTIVE: Middle-aged African-American woman who appears stated age  There were no vitals filed for this visit. Wt Readings from Last 3 Encounters:  07/30/19 202 lb 12.8 oz (92 kg)  07/02/19 204 lb 3 oz (92.6 kg)  04/09/19 206 lb (93.4 kg)   There is no height or weight on file to calculate BMI.    ECOG FS:1 - Symptomatic but completely ambulatory  Ocular: Sclerae unicteric, pupils round and equal Ear-nose-throat: Wearing a mask Lungs no rales or rhonchi Heart regular rate and rhythm Abd soft, nontender, positive bowel sounds MSK no focal spinal tenderness, no joint edema Neuro: non-focal, well-oriented, appropriate affect Breasts: She is status post bilateral mastectomy. Skin: Her lesions along the back and right arm show continuing response  Photo 07/04/2017     Photo 08/23/2018   Photo 12/19/2018     Photo 01/16/2019    LAB RESULTS:.  Lab Results  Component Value Date   WBC 6.0 07/30/2019   NEUTROABS 3.6 07/30/2019   HGB 10.9 (L) 07/30/2019   HCT 35.3 (L) 07/30/2019   MCV 84.7 07/30/2019   PLT 206 07/30/2019      Chemistry      Component Value Date/Time   NA 140 07/30/2019 1115   NA 142 11/09/2017 1214   K 4.1 07/30/2019 1115   K 3.9 11/09/2017 1214   CL 100 07/30/2019 1115   CL 101 03/07/2013 1411   CO2 30 07/30/2019 1115   CO2 32 (H) 11/09/2017 1214   BUN 27 (H) 07/30/2019 1115   BUN 19.1 11/09/2017 1214   CREATININE 1.05 (H) 07/30/2019 1115   CREATININE 1.0  11/09/2017 1214      Component Value Date/Time   CALCIUM 9.4 07/30/2019 1115   CALCIUM 9.1 11/09/2017 1214   ALKPHOS 132 (H) 07/30/2019 1115   ALKPHOS 120 11/09/2017  1214   AST 27 07/30/2019 1115   AST 30 11/09/2017 1214   ALT 17 07/30/2019 1115   ALT 19 11/09/2017 1214   BILITOT <0.2 (L) 07/30/2019 1115   BILITOT <0.22 11/09/2017 1214       STUDIES: Nm Pet Image Restag (ps) Skull Base To Thigh  Result Date: 08/20/2019 CLINICAL DATA:  Subsequent treatment strategy for breast cancer and angiosarcoma of the right upper extremity. EXAM: NUCLEAR MEDICINE PET SKULL BASE TO THIGH TECHNIQUE: 9.4 mCi F-18 FDG was injected intravenously. Full-ring PET imaging was performed from the skull base to thigh after the radiotracer. CT data was obtained and used for attenuation correction and anatomic localization. Fasting blood glucose: 100 mg/dl COMPARISON:  02/25/2014 FINDINGS: Mediastinal blood pool activity: SUV max 2.4 Liver activity: SUV max NA NECK: Anteriorly in the right middle cranial fossa, a hypermetabolic lesion measuring about 5.0 by 2.4 by 3.4 cm is present, maximum SUV 21.5. Possible photopenic lesion in the right frontal lobe region. Neither of these are really well appreciated on the CT data. There is bilateral level IIa, and left level IIb hypermetabolic adenopathy in the neck. An indistinctly marginated left level IIa lymph node probably measuring about 0.8 cm in short axis on image 34/4 has a maximum SUV 9.3. A left level IIb lymph node measuring 0.9 cm in short axis on image 34/4 has a maximum SUV of 9.0. A small right focus of accentuated metabolic activity in the vicinity of the right level 2 chain has a maximum SUV of 5.9. There is also some mildly asymmetric accentuated activity along the right palatine tonsil, maximum SUV 5.6 compared to the left 3.7, significance uncertain. Mildly asymmetric glottic activity, with the right cord demonstrating mildly accentuated activity compared to  the left. Incidental CT findings: none CHEST: There is a patch of subcutaneous infiltration along the right upper back with foci accentuated metabolic activity internally. Most prominent of these is somewhat centrally within this region, measuring about 1.5 cm in diameter with maximum SUV 12.2. Further caudad there is similar subcutaneous infiltration as shown on image 78/4 where a 5.7 by 1.9 cm lesion in the subcutaneous tissues has a maximum SUV of 19.6. A left axillary node measuring 1.4 cm in short axis on image 56/4 has a maximum SUV of 2.0. Incidental CT findings: Radiation therapy related findings anteriorly in the right chest. Subcutaneous stranding in the right upper arm for example on image 19/4. Bilateral mastectomies, atrophic pectoralis musculature on the right. ABDOMEN/PELVIS: Hypermetabolic retroperitoneal, bilateral common iliac, left external iliac, and bilateral inguinal adenopathy. A left periaortic lymph node measuring 1.3 cm in short axis on image 129/4 has a maximum SUV of 14.9. Index left external iliac node 0.9 cm in short axis on image 160/4, maximum SUV 11.7. Index left inguinal lymph node 1.6 cm in short axis on image 172/4, maximum SUV 11.9. Indistinct celiac node with maximum SUV of 4.8. Incidental CT findings: Prominence of the common bile duct at 1.1 cm in diameter, cause uncertain. Aortoiliac atherosclerotic vascular disease. Pelvic floor laxity with cystocele extending 1.5 cm below the pubococcygeal line. SKELETON: No significant abnormal hypermetabolic activity in this region. Incidental CT findings: none IMPRESSION: 1. Hypermetabolic bilateral internal jugular, retroperitoneal, and pelvic adenopathy compatible with active malignancy. 2. 5.0 by 2.4 by 3.4 cm hypermetabolic intracranial lesion anteriorly in the right middle cranial fossa. Little in the way of correlative findings on the CT. This could be metastatic. Brain MRI with and without contrast is recommended, if not feasible  than consider CT of the head with and without contrast. 3. They are patches of subcutaneous infiltration along the right back with hypermetabolic activity, concerning for subcutaneous malignancy. 4. Mildly asymmetric palatine tonsillar activity and glottic activity, probably incidental. 5. Other imaging findings of potential clinical significance: Dilated common bile duct, cause uncertain. Aortic Atherosclerosis (ICD10-I70.0). Pelvic floor laxity with cystocele. Electronically Signed   By: Van Clines [name]   On: 08/20/2019 16:39     ASSESSMENT:  63 y.o. [place] woman with stage IV breast cancer manifested chiefly by loco-regional nodal disease (neck, chest) and skin involvement, without liver, bone, or brain metastases documented   (1) Status post right upper inner quadrant lumpectomy and sentinel lymph node sampling 03/04/2002 for 2 separate foci of invasive ductal carcinoma, mpT2 pN1 or stage IIB, both foci grade 3, both estrogen and progesterone receptor positive, both HercepTest 3+, Mib-1 56%  (2) Reexcision for margins 05/27/2002 showed no residual cancer in the breast.  (3) The patient refused adjuvant systemic therapy.  (4) Adjuvant radiation treatment completed 08/14/2002.  (5) recurrence in the right breast in 02/2004 showing a morphologically different tumor, again grade 3, again estrogen and progesterone receptor negative, with an MIB-1 of 14% and Herceptest 3+.  (5) Between 03/2004 and 07/2004 she received dose dense Doxorubicin/Cyclophosphamide x 4 given with trastuzumab, followed by weekly Docetaxel x 8, again given with trastuzumab.  (6) Right mastectomy 07/13/2004 showed scattered microscopic foci of residual disease over an area  greater than 5 cm. Margins were negative.  (7) Postoperative Docetaxel continued until 09/2004.  (8) Trastuzumab (Herceptin) given 08/2004 through 01/2012 with some brief interruptions.  RECURRENT/ STAGE IV DISEASE December 2006 (9)  Isolated right cervical nodal recurrence 10/2005, treated with radiation to the right supraclavicular area (total 41.5 gray) completed 12/29/2005.  (10) Navelbine given together with Herceptin  11/2005 through 03/2006.  (9) Left mastectomy 02/13/2006 for ductal carcinoma in situ, grade 2, estrogen and progesterone receptor negative, with negative margins; 0 of 3 lymph nodes involved  (10) treated with Lapatinib and Capecitabine before 10/2009, for an unclear duration and with unclear results (cannot locate data on chart review).  (11) Status post right supraclavicular lymph node biopsy 09/2010 again positive for an invasive ductal carcinoma, estrogen and progesterone receptor negative, HER-2 positive by CISH with a ratio 4.25.  (12) Navelbine given together with Herceptin between 05/2011 and 11/2011.  (13) Carboplatin/ Gemcitabine/ Herceptin given for 2 cycles, in 12/2011 and 01/2012.  (14) TDM-1 ([place]) started 02/2012. Last dose 10/02/2013 after which the patient discontinued treatment at her own discretion. Echo on December 2014 showed a well preserved ejection fraction.  (15) Deep vein thrombosis of the right upper extremity documented 04/20/2011.  She completed anticoagulant therapy with Coumadin on 03/25/2013.  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 Left chest Port-A-Cath placed on 04/08/2013; being flushed every 6 weeks  RIGHT UPPER EXTREMITY ANGIOSARCOMA VS BREAST CANCER: August 2016 (18) treated at [facility] of [place] August 2016 with paclitaxel, trastuzumab and pertuzumab 1 cycle, afterwards referred to hospice  (19) under the care of hospice of [facility] August 2016 to 05/08/2016, when the patient opted for a second try at  chemotherapy  (20) started low-dose Abraxane, trastuzumab and pertuzumab 06/07/2016, to be repeated every 4 weeks.   (a) pretreatment echocardiogram 06/06/2016  showed a 60-65% ejection fraction  (b) significant compliance problems compromise response to treatment  (c)  echocardiogram 02/20/17 LVEF 55-60%  (d) Abraxane discontinued 07/04/2017 because of possible neuropathy symptoms  (e) Abraxane resumed 09/27/2017  (f) echocardiogram 07/20/2017 showed an ejection fraction of 60-65%  (g) echo 01/22/2018 showed an ejection fraction of 50-55%  (h) CT scan of the chest 05/03/2018 shows no disease involving the lungs liver or lymph nodes, with stable subcutaneous masses as previously noted  (I) Abraxane discontinued August 2019 because of concerns regarding neuropathy-- gemcitabine substituted  (j) echocardiogram 08/20/2018 shows an ejection fraction in the 55-60% range (done every 6 months)  (k) gemcitabine discontinued because of multiple symptoms, Abraxane restarted 09/11/2018, given once every 4 weeks  (l) echocardiogram 07/17/2019 shows a well-preserved ejection fraction at 55-60%  (21) Chronic pain secondary to known metastatic disease  (a) patient signed pain contract 10/19/2016  (b) as of 11/16/2016 receives her pain medications from Dr [name]  (c)  Dr [name] no longer able to prescribe as of February 2019  (d) all pain medications now through [facility] (as of April 2019)  (22) left breast and left axillary adenopathy noted clinically  (a) left axillary lymph node biopsy on 07/24/2019 benign  (b) left breast mass   PLAN: Lariah is now a little over 4 years out since definitive diagnosis of right upper extremity angiosarcoma, metastatic to the back and chest wall.  Debbora Presto, NP  08/29/19 10:59 AM Medical Oncology and Hematology [facility] [place], [place] [address] Tel. [phone]    Fax. [phone]

## 2019-08-29 NOTE — Telephone Encounter (Signed)
Returned patient's phone call regarding rescheduling 10/22 missed appointments, informed patient I will need to give her a call back once I get further assistance from provider.

## 2019-08-30 ENCOUNTER — Telehealth: Payer: Self-pay | Admitting: Oncology

## 2019-08-30 NOTE — Telephone Encounter (Signed)
Scheduled appt per 10/22 sch message - d/t per RN Elmyra Ricks staff message .  called pt and they are aware of appt date and time

## 2019-09-03 ENCOUNTER — Other Ambulatory Visit: Payer: Self-pay | Admitting: Oncology

## 2019-09-03 ENCOUNTER — Telehealth: Payer: Self-pay | Admitting: *Deleted

## 2019-09-03 NOTE — Telephone Encounter (Signed)
This RN spoke with pt post VM left stating she will be going for MRI tonight - noted MRI is scheduled for tomorrow evening.  Appointment verified with MRI department as in [place] for 8pm tomorrow.   Above verified with pt as well as this RN spoke with her daughter and gave above information.  Pt states she does not need to see [name] tomorrow " I want to see him after I have the MRI "  This RN verified pt does not have any active needs besides MRI per abnormal PET.  Plan per review with MD is for this RN to call pt on Thursday post MRI with plan and further appointments.

## 2019-09-04 ENCOUNTER — Other Ambulatory Visit: Payer: Self-pay

## 2019-09-04 ENCOUNTER — Encounter (HOSPITAL_COMMUNITY): Payer: Self-pay | Admitting: *Deleted

## 2019-09-04 ENCOUNTER — Emergency Department (HOSPITAL_COMMUNITY)
Admission: EM | Admit: 2019-09-04 | Discharge: 2019-09-05 | Disposition: A | Discharge status: Home / Self Care | Payer: Medicare Other | Attending: Emergency Medicine | Admitting: Emergency Medicine

## 2019-09-04 ENCOUNTER — Ambulatory Visit (HOSPITAL_COMMUNITY)
Admission: RE | Admit: 2019-09-04 | Discharge: 2019-09-04 | Disposition: A | Discharge status: Home / Self Care | Payer: Medicare Other | Source: Ambulatory Visit | Attending: Oncology | Admitting: Oncology

## 2019-09-04 ENCOUNTER — Ambulatory Visit: Payer: Medicare Other | Admitting: Oncology

## 2019-09-04 DIAGNOSIS — Z170 Estrogen receptor positive status [ER+]: Secondary | ICD-10-CM

## 2019-09-04 DIAGNOSIS — C4911 Malignant neoplasm of connective and soft tissue of right upper limb, including shoulder: Secondary | ICD-10-CM | POA: Insufficient documentation

## 2019-09-04 DIAGNOSIS — C7931 Secondary malignant neoplasm of brain: Secondary | ICD-10-CM | POA: Diagnosis not present

## 2019-09-04 DIAGNOSIS — C50911 Malignant neoplasm of unspecified site of right female breast: Secondary | ICD-10-CM

## 2019-09-04 DIAGNOSIS — F17200 Nicotine dependence, unspecified, uncomplicated: Secondary | ICD-10-CM | POA: Insufficient documentation

## 2019-09-04 DIAGNOSIS — Z79899 Other long term (current) drug therapy: Secondary | ICD-10-CM | POA: Diagnosis not present

## 2019-09-04 DIAGNOSIS — C50211 Malignant neoplasm of upper-inner quadrant of right female breast: Secondary | ICD-10-CM

## 2019-09-04 DIAGNOSIS — I10 Essential (primary) hypertension: Secondary | ICD-10-CM | POA: Insufficient documentation

## 2019-09-04 DIAGNOSIS — R519 Headache, unspecified: Secondary | ICD-10-CM | POA: Insufficient documentation

## 2019-09-04 LAB — CBC
HCT: 37.5 % (ref 36.0–46.0)
Hemoglobin: 11.5 g/dL — ABNORMAL LOW (ref 12.0–15.0)
MCH: 26.7 pg (ref 26.0–34.0)
MCHC: 30.7 g/dL (ref 30.0–36.0)
MCV: 87.2 fL (ref 80.0–100.0)
Platelets: 290 10*3/uL (ref 150–400)
RBC: 4.3 MIL/uL (ref 3.87–5.11)
RDW: 16.4 % — ABNORMAL HIGH (ref 11.5–15.5)
WBC: 5.1 10*3/uL (ref 4.0–10.5)
nRBC: 0 % (ref 0.0–0.2)

## 2019-09-04 LAB — BASIC METABOLIC PANEL WITH GFR
Anion gap: 7 (ref 5–15)
BUN: 20 mg/dL (ref 8–23)
CO2: 29 mmol/L (ref 22–32)
Calcium: 9.1 mg/dL (ref 8.9–10.3)
Chloride: 101 mmol/L (ref 98–111)
Creatinine, Ser: 0.91 mg/dL (ref 0.44–1.00)
GFR calc Af Amer: >60 mL/min
GFR calc non Af Amer: >60 mL/min
Glucose, Bld: 95 mg/dL (ref 70–99)
Potassium: 3.7 mmol/L (ref 3.5–5.1)
Sodium: 137 mmol/L (ref 135–145)

## 2019-09-04 MED ORDER — METOCLOPRAMIDE HCL 5 MG/ML IJ SOLN
10.0000 mg | Freq: Once | INTRAMUSCULAR | Status: AC
  Administered 2019-09-04: 10 mg via INTRAVENOUS
  Filled 2019-09-04: qty 2

## 2019-09-04 MED ORDER — HEPARIN SOD (PORK) LOCK FLUSH 100 UNIT/ML IV SOLN
500.0000 [IU] | Freq: Once | INTRAVENOUS | Status: AC
  Administered 2019-09-05: 500 [IU] via INTRAVENOUS
  Filled 2019-09-04: qty 5

## 2019-09-04 MED ORDER — DIPHENHYDRAMINE HCL 50 MG/ML IJ SOLN
12.5000 mg | Freq: Once | INTRAMUSCULAR | Status: AC
  Administered 2019-09-04: 12.5 mg via INTRAVENOUS
  Filled 2019-09-04: qty 1

## 2019-09-04 MED ORDER — SODIUM CHLORIDE 0.9 % IV BOLUS
1000.0000 mL | Freq: Once | INTRAVENOUS | Status: AC
  Administered 2019-09-04: 1000 mL via INTRAVENOUS

## 2019-09-04 MED ORDER — DEXAMETHASONE SODIUM PHOSPHATE 10 MG/ML IJ SOLN
10.0000 mg | Freq: Once | INTRAMUSCULAR | Status: AC
  Administered 2019-09-04: 10 mg via INTRAVENOUS
  Filled 2019-09-04: qty 1

## 2019-09-04 MED ORDER — GADOBUTROL 1 MMOL/ML IV SOLN
10.0000 mL | Freq: Once | INTRAVENOUS | Status: AC | PRN
  Administered 2019-09-04: 10 mL via INTRAVENOUS

## 2019-09-04 NOTE — ED Notes (Signed)
Pt states she does not want to get into a hospital gown. Stated she is too cold and does not want to change

## 2019-09-04 NOTE — ED Notes (Signed)
Pt states that she has had a double mastectomy and can't have blood draws on her arms.

## 2019-09-04 NOTE — ED Provider Notes (Signed)
[facility] DEPT Provider Note   CSN: [id number] Arrival date & time: 09/04/19  2059     History   Chief Complaint Chief Complaint  Patient presents with   Headache    HPI Makenlie Stoehr is a 63 y.o. female.     The history is provided by the patient and medical records.     63 y.o. F with hx of HTN, Rheumatic fever, hx od DVT not currently on anticoagulation, breast cancer, presenting to the ED with headache.  Patient reports she is following at [facility] with Dr. Jana Hakim.  She had a PET scan 08/20/19 which showed findings of intracranial mass.  She had a scheduled MRI this evening which confirmed multiple right frontal and temporal masses consistent with metastatic disease.  Patient states she was ultimately sent here as she wanted something for a headache because she has not been able to sleep at night due to pain.  Pain mostly along the right parietal region and occiput.  States she has been having fairly consistent headaches since she had her PET scan.  States dull, aching, throbbing in nature.  States initially she was having some dizziness but that is since resolved, now it is mostly just pain.  States it prevents her from relaxing enough to go to sleep at night.  States that is why she ended up in the ER today.  She asked the RN in MRI for something for a headache and was told she needed to come here.  Denies any focal numbness/weakness, blurred vision, confusion, tinnitus, changes in speech, or trouble walking.  She is not on anticoagulation currently.  Of note-- per triage note there was reportedly some noted confusion of patient during MRI regarding her IV placement.  Patient denies this, stating she was requesting them not to place a peripheral IV but instead to use her port due to issues she had when a peripheral IV infiltrated in the past.  Past Medical History:  Diagnosis Date   Breast cancer ([place])    DVT (deep venous thrombosis) ([place])  10/07/2011   Fever 02/06/2018   Hypertension    Radiation 11/29/2005-12/29/2005   right supraclavicular area 4147 cGy   Radiation 06/26/02-08/14/02   right breast 5040 cGy, tumor bed boosted to 1260 cGy   Rheumatic fever     Patient Active Problem List   Diagnosis Date Noted   Acute lower UTI 02/06/2018   Altered mental status 02/06/2018   Bacteremia 02/06/2018   Polypharmacy 02/06/2018   Goals of care, counseling/discussion 09/05/2017   Pneumonia 01/25/2017   CAP (community acquired pneumonia) 01/23/2017   Port catheter in place 05/30/2016   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   Chronic pain 04/13/2015   Left arm pain 08/18/2014   Malignant neoplasm of upper-inner quadrant of right breast in female, estrogen receptor positive ([place]) 10/03/2013   Post-lymphadenectomy lymphedema of arm 08/15/2013   Port or reservoir infection 01/29/2013   DVT (deep venous thrombosis) ([place]) 10/07/2011   Chest pain 09/10/2009   Secondary cardiomyopathy ([place]) 09/02/2009   Essential hypertension 02/26/2009   GERD 02/26/2009    Past Surgical History:  Procedure Laterality Date   BREAST SURGERY     MASTECTOMY Bilateral    PORT A CATH REVISION       OB History   No obstetric history on file.      Home Medications    Prior to Admission medications   Medication Sig Start Date End Date Taking? Authorizing  Provider  ALPRAZolam Duanne Moron) 0.5 MG tablet Take 1 tablet (0.5 mg total) by mouth 2 (two) times daily as needed for anxiety. 09/11/18   [name], [name], MD  Ascorbic Acid (VITAMIN C) 100 MG tablet Take 100 mg by mouth daily.      [provider]  cholecalciferol (VITAMIN D) 1000 units tablet Take 1 tablet (1,000 Units total) by mouth daily. 05/16/18   [name], [name], MD  gabapentin (NEURONTIN) 300 MG capsule Take 1 capsule (300 mg total) by mouth 2 (two) times daily as needed. 07/25/19   [name], [name], MD  hydrOXYzine  (ATARAX/VISTARIL) 25 MG tablet TAKE 1 TABLET BY MOUTH THREE TIMES A DAY AS NEEDED 08/21/18   [name], [name], MD  magic mouthwash SOLN SWISH AND SPIT OR SWALLOW WITH 5MLS BEFORE MEALS AND AT BEDTIME 06/04/19   [name], [name], MD  methadone (DOLOPHINE) 10 MG tablet Take 10 mg by mouth 5 (five) times daily. 05/29/18   [provider]  Multiple Vitamin (MULTIVITAMIN) capsule Take 1 capsule by mouth daily.      [provider]  potassium chloride (MICRO-K) 10 MEQ CR capsule Take 2 capsules (20 mEq total) by mouth daily. 08/16/19   [name], [name], MD  prochlorperazine (COMPAZINE) 10 MG tablet Take 1 tablet (10 mg total) by mouth every 6 (six) hours as needed for nausea or vomiting. 06/11/19   [name], [name], MD  triamterene-hydrochlorothiazide (DYAZIDE) 37.5-25 MG capsule Take 1 each (1 capsule total) by mouth every morning. 07/30/19   [name], [name], MD    Family History Family History  Problem Relation Age of Onset   Pancreatic cancer Mother    Kidney cancer Sister 73   Breast cancer Sister 17   Breast cancer Other 22    Social History Social History   Tobacco Use   Smoking status: Light Tobacco Smoker   Smokeless tobacco: Never Used  Substance Use Topics   Alcohol use: Yes    Comment: Rarely   Drug use: No     Allergies   Pork-derived products, Tramadol, and Hydrocodone-acetaminophen   Review of Systems Review of Systems  Neurological: Positive for headaches.  All other systems reviewed and are negative.    Physical Exam Updated Vital Signs BP 139/80 (BP Location: Left Arm)    Pulse 61    Temp 99.5 F (37.5 C) (Oral)    Resp 17    Ht 5\' 5"  (1.651 m)    Wt 91.6 kg    SpO2 97%    BMI 33.61 kg/m   Physical Exam Vitals signs and nursing note reviewed.  Constitutional:      Appearance: She is well-developed.  HENT:     Head: Normocephalic and atraumatic.  Eyes:     Conjunctiva/sclera: Conjunctivae normal.     Pupils: Pupils are  equal, round, and reactive to light.  Neck:     Musculoskeletal: Normal range of motion. No neck rigidity.     Comments: Full ROM, no meningeal signs Cardiovascular:     Rate and Rhythm: Normal rate and regular rhythm.     Heart sounds: Normal heart sounds.  Pulmonary:     Effort: Pulmonary effort is normal. No respiratory distress.     Breath sounds: Normal breath sounds. No stridor.  Abdominal:     General: Bowel sounds are normal.     Palpations: Abdomen is soft.  Musculoskeletal: Normal range of motion.     Comments: Chronic skin changes and scarring of  right upper arm, no acute signs of infection noted  Skin:    General: Skin is warm and dry.  Neurological:     Mental Status: She is alert and oriented to person, place, and time.     Comments: AAOx3, able to give elaborate history about her illness and work-up thus far, answering questions and following commands appropriately; equal strength UE and LE bilaterally; CN grossly intact; moves all extremities appropriately without ataxia; no focal neuro deficits or facial asymmetry appreciated      ED Treatments / Results  Labs (all labs ordered are listed, but only abnormal results are displayed) Labs Reviewed - No data to display  EKG None  Radiology Mr Jeri Cos Wo Contrast  Result Date: 09/04/2019 CLINICAL DATA:  Breast cancer staging with worsening headaches EXAM: MRI HEAD WITHOUT AND WITH CONTRAST TECHNIQUE: Multiplanar, multiecho pulse sequences of the brain and surrounding structures were obtained without and with intravenous contrast. CONTRAST:  39mL GADAVIST GADOBUTROL 1 MMOL/ML IV SOLN COMPARISON:  Head CT 02/06/2018 Brain MRI 02/26/2008 FINDINGS: BRAIN: There are masses within the right frontal and temporal lobes. The frontal lobe mass measures 4.6 x 3.4 cm. The temporal lobe mass is more irregular but measures approximately 6.2 x 2.8 cm. The temporal lobe mass has a greater solid component than the largely necrotic  frontal mass. There is moderate edema surrounding both masses. Anteriorly, there is leftward midline shift of approximately 4 mm. Elsewhere, the white matter signal is normal for age aside from minimal multifocal white matter hyperintensity. No hydrocephalus. There are small amounts of blood within both masses. There is a partially empty sella incidentally noted. VASCULAR: The major intracranial arterial and venous sinus flow voids are normal. Susceptibility-sensitive sequences show no chronic microhemorrhage or superficial siderosis. SKULL AND UPPER CERVICAL SPINE: Calvarial bone marrow signal is normal. There is no skull base mass. The visualized upper cervical spine and soft tissues are normal. SINUSES/ORBITS: There are no fluid levels or advanced mucosal thickening. The mastoid air cells and middle ear cavities are free of fluid. The orbits are normal. IMPRESSION: 1. Multiple right frontal and temporal lobe masses, most consistent with metastatic disease. 2. Associated mass effect with approximately 4 mm leftward midline shift. This examination was performed as a routine outpatient scan. During the study, the patient complained headache and demonstrated some decreased alertness. She was transferred to the ER for further assessment and is there at the time of this dictation. Electronically Signed   By: Ulyses Jarred [name]   On: 09/04/2019 21:44    Procedures Procedures (including critical care time)  Medications Ordered in ED Medications - No data to display   Initial Impression / Assessment and Plan / ED Course  I have reviewed the triage vital signs and the nursing notes.  Pertinent labs & imaging results that were available during my care of the patient were reviewed by me and considered in my medical decision making (see chart for details).  63 year old female here with headache.  Unfortunately, has metastatic breast cancer with newly diagnosed brain mets.  Had PET scan earlier in the month and  dedicated MRI today which revealed multiple right frontal and temporal lobe masses.  There is mass-effect with 4 mm midline shift.  Triage note alluded to that there may have been some confusion regarding her IV placement during MRI, however patient stated she was just asking them not to stick her peripherally, but rather to use her port.  Does report headaches that areas consistent with  her tumor location.  Here she is awake, alert, appropriately oriented.  She is moving all of her extremities well without noted deficit or ataxia.  Her speech is clear and goal oriented.  She is aware of MRI findings, states she was sent here for treatment of her headache as RN told her they could not administer medications as she was just there for a radiology appointment.  Given the mass-effect finding, will discuss with neurosurgery as she may benefit from steroids.  10:32 PM Discussed with neurosurgery, PA Kim-- recommends can give IV decadron now and start Decadron 4mg  Q8H and let oncology taper her down and discuss further options in the AM (radiation, etc).  12:00 AM Patient feeling better after IV medications here given via port.  Labs reassuring.  States she is ready to go home.  She remains AAOx3 without focal deficit.  She has FU with oncology in the morning to discuss MRI results and treatment options.  Prescription for Decadron sent to her pharmacy as recommended by neurosurgery.  Return here for any new or acute changes, specifically any focal numbness, weakness, dizziness, confusion, difficulty walking, changes in speech, etc.  Final Clinical Impressions(s) / ED Diagnoses   Final diagnoses:  Cancer of right breast metastatic to brain [facility])    ED Discharge Orders         Ordered    dexamethasone (DECADRON) 4 MG tablet  Every 8 hours     09/05/19 0001           Larene Pickett, PA-C 09/05/19 [id number]    Gareth Morgan, MD 09/07/19 1220

## 2019-09-04 NOTE — ED Triage Notes (Signed)
Pt was at the hospital for an outpatient MRI and was found to have a tumor in her brain.  Pt is a breast CA. During her MRI, pt appeared to be confused and was requesting her IV be placed in her foot.  Later the pt didn't realize that her IV had already been placed and contrast was already given.  Pt states she is here for a headache.

## 2019-09-05 ENCOUNTER — Ambulatory Visit
Admission: RE | Admit: 2019-09-05 | Discharge: 2019-09-05 | Disposition: A | Discharge status: Home / Self Care | Payer: Medicare Other | Source: Ambulatory Visit | Attending: Radiation Oncology | Admitting: Radiation Oncology

## 2019-09-05 ENCOUNTER — Ambulatory Visit: Payer: Medicare Other | Admitting: Radiation Oncology

## 2019-09-05 ENCOUNTER — Other Ambulatory Visit: Payer: Self-pay

## 2019-09-05 ENCOUNTER — Telehealth: Payer: Self-pay | Admitting: *Deleted

## 2019-09-05 ENCOUNTER — Encounter: Payer: Self-pay | Admitting: Radiation Oncology

## 2019-09-05 VITALS — BP 113/82 | HR 66 | Temp 98.2°F | Resp 16 | Ht 65.0 in | Wt 202.0 lb

## 2019-09-05 DIAGNOSIS — Z170 Estrogen receptor positive status [ER+]: Secondary | ICD-10-CM

## 2019-09-05 DIAGNOSIS — Z800 Family history of malignant neoplasm of digestive organs: Secondary | ICD-10-CM | POA: Insufficient documentation

## 2019-09-05 DIAGNOSIS — Z9013 Acquired absence of bilateral breasts and nipples: Secondary | ICD-10-CM | POA: Insufficient documentation

## 2019-09-05 DIAGNOSIS — C50211 Malignant neoplasm of upper-inner quadrant of right female breast: Secondary | ICD-10-CM | POA: Insufficient documentation

## 2019-09-05 DIAGNOSIS — Z923 Personal history of irradiation: Secondary | ICD-10-CM | POA: Insufficient documentation

## 2019-09-05 DIAGNOSIS — C7931 Secondary malignant neoplasm of brain: Secondary | ICD-10-CM | POA: Insufficient documentation

## 2019-09-05 DIAGNOSIS — I10 Essential (primary) hypertension: Secondary | ICD-10-CM | POA: Insufficient documentation

## 2019-09-05 DIAGNOSIS — Z79899 Other long term (current) drug therapy: Secondary | ICD-10-CM | POA: Insufficient documentation

## 2019-09-05 DIAGNOSIS — Z86718 Personal history of other venous thrombosis and embolism: Secondary | ICD-10-CM | POA: Insufficient documentation

## 2019-09-05 DIAGNOSIS — R519 Headache, unspecified: Secondary | ICD-10-CM | POA: Diagnosis not present

## 2019-09-05 DIAGNOSIS — Z803 Family history of malignant neoplasm of breast: Secondary | ICD-10-CM | POA: Insufficient documentation

## 2019-09-05 DIAGNOSIS — F17210 Nicotine dependence, cigarettes, uncomplicated: Secondary | ICD-10-CM | POA: Insufficient documentation

## 2019-09-05 MED ORDER — DEXAMETHASONE 4 MG PO TABS: 4.0000 mg | ORAL_TABLET | Freq: Three times a day (TID) | ORAL | 0 refills | Status: DC

## 2019-09-05 MED ORDER — DEXAMETHASONE 4 MG PO TABS
4.0000 mg | ORAL_TABLET | Freq: Once | ORAL | Status: AC
  Administered 2019-09-05: 4 mg via ORAL
  Filled 2019-09-05: qty 1

## 2019-09-05 NOTE — Progress Notes (Signed)
Radiation Oncology         ([phone]) [phone] ________________________________  Name: [name]        MRN: [id number]  Date of Service: 09/05/2019 DOB: 1956/03/03  [id number], No Pcp Per  [name], [name], MD     REFERRING PHYSICIAN: [name], [name], MD   DIAGNOSIS: The primary encounter diagnosis was Metastasis to brain [facility]). A diagnosis of Malignant neoplasm of upper-inner quadrant of right breast in female, estrogen receptor positive ([place]) was also pertinent to this visit.   HISTORY OF PRESENT ILLNESS: [name] is a 63 y.o. female seen at the request of Dr. Mariann Laster on behalf of [facility] for brain metastases in the setting of metastatic breast cancer.  The patient was originally diagnosed with an estrogen driven node positive right invasive ductal carcinoma.  She underwent surgical resection followed by adjuvant radiotherapy, she was diagnosed with recurrence in 2005 and has been on multiple regimens as mutations and alterations in her prognostic factors changed over time.  She was noted to have metastatic disease in 2006 and received additional radiotherapy to the right supraclavicular region.  She did undergo a left mastectomy as well as disease was noted in the left breast. Her most recent regimen has included Abraxane Perjeta and Kanjinti.  Her last infusion was given on 07/30/2019.  She was counseled on reimaging and underwent pet imaging on 08/20/2019, she had active disease in the neck nodes, retroperitoneal nodes, and pelvic nodes.  There was also concern for intracranial hypermetabolism, subcutaneous changes within the right thorax, and she was encouraged to proceed with an MRI of the brain.  This was performed yesterday and revealed a 4.3 x 3.4 cm right frontal lobe mass as well as a 6.2 x 2.8 cm right temporal lobe mass.  There is moderate edema and leftward midline shift approximately 4 mm.  Dr. Jana Hakim is not in the clinic today but his partner Dr. Lindi Adie reviewed the films and  recommended proceeding with dexamethasone.  This was called into the pharmacy.  She also was seen in the emergency room and given a dose of dexamethasone as she had not picked this up yet.  She is seen today for urgent work in to discuss options of palliative radiotherapy.   PREVIOUS RADIATION THERAPY: Yes   2007:  The right supraclavicular region was reirradiated to 41.5 Gy  2003:  The patient received adjuvant radiotherapy to the right breast and regional lymph nodes in [facility]    PAST MEDICAL HISTORY:  Past Medical History:  Diagnosis Date   Breast cancer ([place])    DVT (deep venous thrombosis) ([place]) 10/07/2011   Fever 02/06/2018   Hypertension    Radiation 11/29/2005-12/29/2005   right supraclavicular area 4147 cGy   Radiation 06/26/02-08/14/02   right breast 5040 cGy, tumor bed boosted to 1260 cGy   Rheumatic fever        PAST SURGICAL HISTORY: Past Surgical History:  Procedure Laterality Date   BREAST SURGERY     MASTECTOMY Bilateral    PORT A CATH REVISION       FAMILY HISTORY:  Family History  Problem Relation Age of Onset   Pancreatic cancer Mother    Kidney cancer Sister 50   Breast cancer Sister 7   Breast cancer Other 79     SOCIAL HISTORY:  reports that she has been smoking. She has never used smokeless tobacco. She reports current alcohol use. She reports that she does not use drugs.  The patient is married and  resides in [place].  She is originally from [place].  Her daughter was able to join [name] on a video call.   ALLERGIES: Pork-derived products, Tramadol, and Hydrocodone-acetaminophen   MEDICATIONS:  Current Outpatient Medications  Medication Sig Dispense Refill   ALPRAZolam (XANAX) 0.5 MG tablet Take 1 tablet (0.5 mg total) by mouth 2 (two) times daily as needed for anxiety. (Patient taking differently: Take 0.5 mg by mouth daily as needed for anxiety. ) 45 tablet 0   Ascorbic Acid (VITAMIN C) 100 MG tablet Take 100 mg by  mouth daily.       cholecalciferol (VITAMIN D) 1000 units tablet Take 1 tablet (1,000 Units total) by mouth daily. 90 tablet 4   dexamethasone (DECADRON) 4 MG tablet Take 1 tablet (4 mg total) by mouth every 8 (eight) hours. 21 tablet 0   gabapentin (NEURONTIN) 300 MG capsule Take 1 capsule (300 mg total) by mouth 2 (two) times daily as needed. (Patient taking differently: Take 300 mg by mouth 2 (two) times daily as needed (pain). ) 60 capsule 3   hydrOXYzine (ATARAX/VISTARIL) 25 MG tablet TAKE 1 TABLET BY MOUTH THREE TIMES A DAY AS NEEDED (Patient taking differently: Take 25 mg by mouth 3 (three) times daily as needed for anxiety. ) 270 tablet 1   magic mouthwash SOLN SWISH AND SPIT OR SWALLOW WITH 5MLS BEFORE MEALS AND AT BEDTIME 240 mL 1   methadone (DOLOPHINE) 10 MG tablet Take 10 mg by mouth 3 (three) times daily.   0   Multiple Vitamin (MULTIVITAMIN) capsule Take 1 capsule by mouth daily.       Oxycodone HCl 20 MG TABS Take 20 mg by mouth every 4 (four) hours as needed (pain).      potassium chloride (MICRO-K) 10 MEQ CR capsule Take 2 capsules (20 mEq total) by mouth daily. 60 capsule 1   prochlorperazine (COMPAZINE) 10 MG tablet Take 1 tablet (10 mg total) by mouth every 6 (six) hours as needed for nausea or vomiting. 30 tablet 0   triamterene-hydrochlorothiazide (DYAZIDE) 37.5-25 MG capsule Take 1 each (1 capsule total) by mouth every morning. 30 capsule 3   No current facility-administered medications for this encounter.    Facility-Administered Medications Ordered in Other Encounters  Medication Dose Route Frequency Provider Last Rate Last Dose   sodium chloride 0.9 % injection 10 mL  10 mL Intravenous PRN [name], [name], MD   10 mL at 03/04/14 1421   sodium chloride flush (NS) 0.9 % injection 10 mL  10 mL Intracatheter PRN [name], [name], MD   10 mL at 07/30/19 1627     REVIEW OF SYSTEMS: On review of systems, the patient reports that she reports that overall she  is doing okay.  She has had episodes of dizziness for several weeks, she developed headaches in the last week or so.  She reports episodes of confusion and fogginess cognitively.  She also states that she has had episodes of walking into the wall.  She denies any chest pain, shortness of breath, cough, fevers, chills, night sweats, and states that otherwise she is feeling pretty well.  She continues to have some discomfort in the chest wall area posteriorly with her known history of disease to the subcutaneous tissue.    PHYSICAL EXAM:  Wt Readings from Last 3 Encounters:  09/05/19 202 lb (91.6 kg)  09/04/19 202 lb (91.6 kg)  07/30/19 202 lb 12.8 oz (92 kg)   Temp Readings from Last 3 Encounters:  09/05/19 98.2 F (36.8 C)  09/04/19 99.5 F (37.5 C) (Oral)  07/30/19 98.9 F (37.2 C) (Oral)   BP Readings from Last 3 Encounters:  09/05/19 113/82  09/05/19 (!) 147/90  07/30/19 (!) 143/86   Pulse Readings from Last 3 Encounters:  09/05/19 66  09/05/19 61  07/30/19 74   Pain Assessment Pain Score: 6  Pain Loc: Head/10  In general this is a well appearing African-American female in no acute distress.  She's alert and oriented x4 and appropriate throughout the examination. Cardiopulmonary assessment is negative for acute distress and she exhibits normal effort.    ECOG = 1  0 - Asymptomatic (Fully active, able to carry on all predisease activities without restriction)  1 - Symptomatic but completely ambulatory (Restricted in physically strenuous activity but ambulatory and able to carry out work of a light or sedentary nature. For example, light housework, office work)  2 - Symptomatic, <50% in bed during the day (Ambulatory and capable of all self care but unable to carry out any work activities. Up and about more than 50% of waking hours)  3 - Symptomatic, >50% in bed, but not bedbound (Capable of only limited self-care, confined to bed or chair 50% or more of waking hours)  4  - Bedbound (Completely disabled. Cannot carry on any self-care. Totally confined to bed or chair)  5 - Death   Eustace Pen MM, Creech RH, Tormey DC, et al. [phone]). "Toxicity and response criteria of the [facility]". [place] Oncol. 5 (6): 649-55    LABORATORY DATA:  Lab Results  Component Value Date   WBC 5.1 09/04/2019   HGB 11.5 (L) 09/04/2019   HCT 37.5 09/04/2019   MCV 87.2 09/04/2019   PLT 290 09/04/2019   Lab Results  Component Value Date   NA 137 09/04/2019   K 3.7 09/04/2019   CL 101 09/04/2019   CO2 29 09/04/2019   Lab Results  Component Value Date   ALT 17 07/30/2019   AST 27 07/30/2019   ALKPHOS 132 (H) 07/30/2019   BILITOT <0.2 (L) 07/30/2019      RADIOGRAPHY: Mr Jeri Cos [id number] Contrast  Result Date: 09/04/2019 CLINICAL DATA:  Breast cancer staging with worsening headaches EXAM: MRI HEAD WITHOUT AND WITH CONTRAST TECHNIQUE: Multiplanar, multiecho pulse sequences of the brain and surrounding structures were obtained without and with intravenous contrast. CONTRAST:  23mL GADAVIST GADOBUTROL 1 MMOL/ML IV SOLN COMPARISON:  Head CT 02/06/2018 Brain MRI 02/26/2008 FINDINGS: BRAIN: There are masses within the right frontal and temporal lobes. The frontal lobe mass measures 4.6 x 3.4 cm. The temporal lobe mass is more irregular but measures approximately 6.2 x 2.8 cm. The temporal lobe mass has a greater solid component than the largely necrotic frontal mass. There is moderate edema surrounding both masses. Anteriorly, there is leftward midline shift of approximately 4 mm. Elsewhere, the white matter signal is normal for age aside from minimal multifocal white matter hyperintensity. No hydrocephalus. There are small amounts of blood within both masses. There is a partially empty sella incidentally noted. VASCULAR: The major intracranial arterial and venous sinus flow voids are normal. Susceptibility-sensitive sequences show no chronic microhemorrhage or  superficial siderosis. SKULL AND UPPER CERVICAL SPINE: Calvarial bone marrow signal is normal. There is no skull base mass. The visualized upper cervical spine and soft tissues are normal. SINUSES/ORBITS: There are no fluid levels or advanced mucosal thickening. The mastoid air cells and middle ear cavities are free of  fluid. The orbits are normal. IMPRESSION: 1. Multiple right frontal and temporal lobe masses, most consistent with metastatic disease. 2. Associated mass effect with approximately 4 mm leftward midline shift. This examination was performed as a routine outpatient scan. During the study, the patient complained headache and demonstrated some decreased alertness. She was transferred to the ER for further assessment and is there at the time of this dictation. Electronically Signed   By: Ulyses Jarred [name]   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 EXAM: NUCLEAR MEDICINE PET SKULL BASE TO THIGH TECHNIQUE: 9.4 mCi F-18 FDG was injected intravenously. Full-ring PET imaging was performed from the skull base to thigh after the radiotracer. CT data was obtained and used for attenuation correction and anatomic localization. Fasting blood glucose: 100 mg/dl COMPARISON:  02/25/2014 FINDINGS: Mediastinal blood pool activity: SUV max 2.4 Liver activity: SUV max NA NECK: Anteriorly in the right middle cranial fossa, a hypermetabolic lesion measuring about 5.0 by 2.4 by 3.4 cm is present, maximum SUV 21.5. Possible photopenic lesion in the right frontal lobe region. Neither of these are really well appreciated on the CT data. There is bilateral level IIa, and left level IIb hypermetabolic adenopathy in the neck. An indistinctly marginated left level IIa lymph node probably measuring about 0.8 cm in short axis on image 34/4 has a maximum SUV 9.3. A left  level IIb lymph node measuring 0.9 cm in short axis on image 34/4 has a maximum SUV of 9.0. A small right focus of accentuated metabolic activity in the vicinity of the right level 2 chain has a maximum SUV of 5.9. There is also some mildly asymmetric accentuated activity along the right palatine tonsil, maximum SUV 5.6 compared to the left 3.7, significance uncertain. Mildly asymmetric glottic activity, with the right cord demonstrating mildly accentuated activity compared to the left. Incidental CT findings: none CHEST: There is a patch of subcutaneous infiltration along the right upper back with foci accentuated metabolic activity internally. Most prominent of these is somewhat centrally within this region, measuring about 1.5 cm in diameter with maximum SUV 12.2. Further caudad there is similar subcutaneous infiltration as shown on image 78/4 where a 5.7 by 1.9 cm lesion in the subcutaneous tissues has a maximum SUV of 19.6. A left axillary node measuring 1.4 cm in short axis on image 56/4 has a maximum SUV of 2.0. Incidental CT findings: Radiation therapy related findings anteriorly in the right chest. Subcutaneous stranding in the right upper arm for example on image 19/4. Bilateral mastectomies, atrophic pectoralis musculature on the right. ABDOMEN/PELVIS: Hypermetabolic retroperitoneal, bilateral common iliac, left external iliac, and bilateral inguinal adenopathy. A left periaortic lymph node measuring 1.3 cm in short axis on image 129/4 has a maximum SUV of 14.9. Index left external iliac node 0.9 cm in short axis on image 160/4, maximum SUV 11.7. Index left inguinal lymph node 1.6 cm in short axis on image 172/4, maximum SUV 11.9. Indistinct celiac node with maximum SUV of 4.8. Incidental CT findings: Prominence of the common bile duct at 1.1 cm in diameter, cause uncertain. Aortoiliac atherosclerotic vascular disease. Pelvic floor laxity with cystocele extending 1.5 cm below the pubococcygeal line.  SKELETON: No significant abnormal hypermetabolic activity in this region. Incidental CT findings: none IMPRESSION: 1. Hypermetabolic bilateral internal jugular, retroperitoneal, and pelvic adenopathy compatible with active malignancy. 2.  5.0 by 2.4 by 3.4 cm hypermetabolic intracranial lesion anteriorly in the right middle cranial fossa. Little in the way of correlative findings on the CT. This could be metastatic. Brain MRI with and without contrast is recommended, if not feasible than consider CT of the head with and without contrast. 3. They are patches of subcutaneous infiltration along the right back with hypermetabolic activity, concerning for subcutaneous malignancy. 4. Mildly asymmetric palatine tonsillar activity and glottic activity, probably incidental. 5. Other imaging findings of potential clinical significance: Dilated common bile duct, cause uncertain. Aortic Atherosclerosis (ICD10-I70.0). Pelvic floor laxity with cystocele. Electronically Signed   By: Van Clines [name]   On: 08/20/2019 16:39       IMPRESSION/PLAN: 1. Metastatic breast cancer now with large brain metastases.  Dr. Lisbeth Renshaw discussed the findings from her imaging studies and has reviewed her history.  Given the severity of the size and edema as well as her history of free currents and refractory disease, Dr. Lisbeth Renshaw recommends proceeding with whole brain radiotherapy.  There would be some consideration for salvage radiosurgery in the future if persistence of her disease was still noted following treatment.  We discussed the options of giving therapy in the very near future.  Unfortunately she could not come any earlier today so we would need to simulate on Monday.  Written consent is obtained after we reviewed the risks, benefits, short and long-term effects of radiotherapy.  Dr. Lisbeth Renshaw recommends a course of 2 weeks of daily treatment to the whole brain with palliative intent.  At the end of the conversation the patient is in  agreement to proceed has signed consent and will return on Monday for simulation.  We intend for her treatment to begin on the following day, Tuesday 09/09/2019. She will also follow-up with Dr. Jana Hakim upon his return to discuss further plans for systemic therapy.    In a visit lasting 60 minutes, greater than 50% of the time was spent face to face discussing her case, and coordinating the patient's care.  The above documentation reflects my direct findings during this shared patient visit. Please see the separate note by Dr. Lisbeth Renshaw on this date for the remainder of the patient's plan of care.    Carola Rhine, PAC

## 2019-09-05 NOTE — Telephone Encounter (Signed)
This RN spoke with pt post reviewing MRI of brain showing new brain met with covering provider and call to [facility] provider - Ebony Hail who requested for pt to come in now for evaluation and treatment discussion.  This RN called and spoke with pt - she will arrange transportation and come in- of note she is concerned she may not have a family member " and I need someone with me- I am scared "  This RN gave reassurance as well as also spoke with her daughter [name] as well.  [name] stating understanding of need to bring pt in today for treatment discussion.  Of note they have not picked up the decadron yet- last dose was in the ER prior to discharge last night.  This RN informed pt that decadron dose can be given upon arrival to this office.  Desira verbalized understanding as well as daughter- including possible use of face time for visit for a family member to be present.  This RN alerted Ebony Hail in [employer].

## 2019-09-05 NOTE — Progress Notes (Signed)
Location/Histology of Brain Tumor:   Patient presented with symptoms of:    MRI Brain 09/04/2019: There are masses within the right frontal and temporal lobes.  The frontal lobe mass measures 4.6 x 3.4 cm. The temporal lobe mass is more irregular but measures approximately 6.2 x 2.8 cm. The temporal lobe mass has a greater solid component than the largely necrotic frontal mass. There is moderate edema surrounding both masses. Anteriorly, there is leftward midline shift of approximately 4 mm.  PET 0000000: Hypermetabolic bilateral internal jugular, retroperitoneal, and pelvic adenopathy compatible with active malignancy.  5.0 by 2.4 by 3.4 cm hypermetabolic intracranial lesion anteriorly in the right middle cranial fossa. Little in the way of correlative findings on the CT.   Past or anticipated interventions, if any, per neurosurgery:   Past or anticipated interventions, if any, per medical oncology:   Dose of Decadron, if applicable: 4 mg q8  Recent neurologic symptoms, if any:   Seizures: No  Headaches: Yes, takes pain medication  Nausea: Yes  Dizziness/ataxia: Yes, couple months  Difficulty with hand coordination:   Focal numbness/weakness:   Visual deficits/changes: Yes  Confusion/Memory deficits: Some short term memory loss.    SAFETY ISSUES:  Prior radiation? Yes, Breast  Pacemaker/ICD?   Possible current pregnancy? Postmenopausal  Is the patient on methotrexate? No  Additional Complaints / other details:

## 2019-09-05 NOTE — Discharge Instructions (Signed)
Decadron was sent to your pharmacy, please pick this up in the morning. Follow-up with your oncologist in the morning as scheduled so you can review your MRI again and discuss treatment options. Return to the ED for new or worsening symptoms-- dizziness, confusion, numbness, weakness, etc.

## 2019-09-06 ENCOUNTER — Encounter: Payer: Self-pay | Admitting: General Practice

## 2019-09-06 ENCOUNTER — Telehealth: Payer: Self-pay

## 2019-09-06 ENCOUNTER — Other Ambulatory Visit: Payer: Self-pay

## 2019-09-06 DIAGNOSIS — C50211 Malignant neoplasm of upper-inner quadrant of right female breast: Secondary | ICD-10-CM

## 2019-09-06 DIAGNOSIS — Z170 Estrogen receptor positive status [ER+]: Secondary | ICD-10-CM

## 2019-09-06 NOTE — Progress Notes (Signed)
[place] Psychosocial Distress Screening Clinical Social Work  Clinical Social Work was referred by distress screening protocol.  The patient scored a 7 on the Psychosocial Distress Thermometer which indicates moderate distress. Clinical Social Worker contacted patient by phone to assess for distress and other psychosocial needs.   Has Medicaid Personal Care Services aids 3 - 4 hours/day.  Lives with daughter and 64 year old grandson.  "He doesn't really know anything, he plays a lot, we go out a lot."  Feels "he knows I am sick, but he doesn't think I look sick, he helps me out."  "We worry about him, how hard he would take it."  Would like shower chair and cane - will ask desk RN to look into DME needs.  Does not have home health at this time.    Was advised by MD not to drive, referred to [employer] - they will call her to arrange transport as needed to [facility] appointments.    ONCBCN DISTRESS SCREENING 09/05/2019  Screening Type Initial Screening  Distress experienced in past week (1-10) 7  Practical problem type Transportation  Emotional problem type Nervousness/Anxiety;Adjusting to illness  Physical Problem type Pain    Clinical Social Worker follow up needed: Yes.  Will call in two weeks to check on progress.   If yes, follow up plan:  Beverely Pace, [place], [place] Social Worker Phone:  [phone] Cell:  [phone]

## 2019-09-06 NOTE — Telephone Encounter (Signed)
Per pt request DME for cane and shower chair faxed to [facility] on [address]

## 2019-09-09 ENCOUNTER — Other Ambulatory Visit: Payer: Self-pay

## 2019-09-09 ENCOUNTER — Ambulatory Visit: Payer: Medicare Other | Admitting: Radiation Oncology

## 2019-09-09 ENCOUNTER — Ambulatory Visit: Payer: Medicare Other | Attending: Radiation Oncology | Admitting: Radiation Oncology

## 2019-09-09 ENCOUNTER — Telehealth: Payer: Self-pay | Admitting: Oncology

## 2019-09-09 ENCOUNTER — Other Ambulatory Visit: Payer: Self-pay | Admitting: Oncology

## 2019-09-09 ENCOUNTER — Ambulatory Visit
Admission: RE | Admit: 2019-09-09 | Discharge: 2019-09-09 | Disposition: A | Discharge status: Home / Self Care | Payer: Medicare Other | Source: Ambulatory Visit | Attending: Radiation Oncology | Admitting: Radiation Oncology

## 2019-09-09 DIAGNOSIS — Z923 Personal history of irradiation: Secondary | ICD-10-CM | POA: Diagnosis not present

## 2019-09-09 DIAGNOSIS — Z79899 Other long term (current) drug therapy: Secondary | ICD-10-CM | POA: Insufficient documentation

## 2019-09-09 DIAGNOSIS — Z803 Family history of malignant neoplasm of breast: Secondary | ICD-10-CM | POA: Insufficient documentation

## 2019-09-09 DIAGNOSIS — F17210 Nicotine dependence, cigarettes, uncomplicated: Secondary | ICD-10-CM | POA: Insufficient documentation

## 2019-09-09 DIAGNOSIS — C50211 Malignant neoplasm of upper-inner quadrant of right female breast: Secondary | ICD-10-CM | POA: Insufficient documentation

## 2019-09-09 DIAGNOSIS — Z9013 Acquired absence of bilateral breasts and nipples: Secondary | ICD-10-CM | POA: Insufficient documentation

## 2019-09-09 DIAGNOSIS — Z170 Estrogen receptor positive status [ER+]: Secondary | ICD-10-CM | POA: Insufficient documentation

## 2019-09-09 DIAGNOSIS — C7931 Secondary malignant neoplasm of brain: Secondary | ICD-10-CM | POA: Insufficient documentation

## 2019-09-09 DIAGNOSIS — Z800 Family history of malignant neoplasm of digestive organs: Secondary | ICD-10-CM | POA: Insufficient documentation

## 2019-09-09 DIAGNOSIS — I10 Essential (primary) hypertension: Secondary | ICD-10-CM | POA: Diagnosis not present

## 2019-09-09 DIAGNOSIS — Z86718 Personal history of other venous thrombosis and embolism: Secondary | ICD-10-CM | POA: Diagnosis not present

## 2019-09-09 NOTE — Telephone Encounter (Signed)
Called patient regarding upcoming scheduled appointment on 11/06, patient is notified. Date and time approved by provider per 11/02 staff message.

## 2019-09-10 ENCOUNTER — Telehealth: Payer: Self-pay | Admitting: Radiation Therapy

## 2019-09-10 DIAGNOSIS — C7931 Secondary malignant neoplasm of brain: Secondary | ICD-10-CM | POA: Diagnosis not present

## 2019-09-10 NOTE — Telephone Encounter (Signed)
Spoke with pt about her appointment to see Dr. [name] prior to her radiation appointment on Thursday 11/5.    Mont Dutton R.T.(R)(T) Radiation Special Procedures Navigator

## 2019-09-11 ENCOUNTER — Other Ambulatory Visit: Payer: Self-pay

## 2019-09-11 ENCOUNTER — Ambulatory Visit
Admission: RE | Admit: 2019-09-11 | Discharge: 2019-09-11 | Disposition: A | Discharge status: Home / Self Care | Payer: Medicare Other | Source: Ambulatory Visit | Attending: Radiation Oncology | Admitting: Radiation Oncology

## 2019-09-11 DIAGNOSIS — C7931 Secondary malignant neoplasm of brain: Secondary | ICD-10-CM | POA: Diagnosis not present

## 2019-09-12 ENCOUNTER — Other Ambulatory Visit: Payer: Self-pay

## 2019-09-12 ENCOUNTER — Inpatient Hospital Stay: Payer: Medicare Other | Admitting: Internal Medicine

## 2019-09-12 ENCOUNTER — Ambulatory Visit
Admission: RE | Admit: 2019-09-12 | Discharge: 2019-09-12 | Disposition: A | Discharge status: Home / Self Care | Payer: Medicare Other | Source: Ambulatory Visit | Attending: Radiation Oncology | Admitting: Radiation Oncology

## 2019-09-12 DIAGNOSIS — C7931 Secondary malignant neoplasm of brain: Secondary | ICD-10-CM | POA: Diagnosis not present

## 2019-09-12 NOTE — Progress Notes (Signed)
[place]  [phone] Fax:([phone]) [phone]     ID: Camielle Sizer   DOB: 1956-09-18  MR#: [id number]  MVE#:[id number]  Patient Care Team: Patient, No Pcp Per as PCP - General (General Practice) Jahyra Sukup, [name], MD as Consulting Physician (Oncology) [name], MD as Consulting Physician (Radiation Oncology) [name], [name], MD as Consulting Physician (Cardiology) [name] [name], MD as Consulting Physician (Psychiatry) [name], [name] 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   As far as her peripheral disease is concerned, she continues on abraxane every 28 days.  She denies any neuropathy or other significant side effects from this medication.  She also continues on  trastuzumab and pertuzumab, which she receives every 28 days. Koula's last echocardiogram on 07/17/2019, showed an ejection fraction in the 55% - 60% range.   However, since her last visit, she underwent restaging PET scan on 08/20/2019, which showed: hypermetabolic bilateral internal jugular, retroperitoneal, and pelvic adenopathy; 5 cm hypermetabolic intracranial lesion anteriorly in the right middle cranial fossa; patches of subcutaneous infiltration along the right back with hypermetabolic activity; probably incidental mildly asymmetric palatine tonsillar and glottic activity.  In light of PET scan findings, she proceeded to brain MRI, which revealed: multiple right frontal and temporal lobe masses, most consistent with metastatic disease; associated mass effect with approximately 4 mm leftward midline shift.  During her brain MRI,  she complained of headache and demonstrated some decreased alertness. She was transferred to the ED and given decadron. She was seen the next day by Dr. Lisbeth Renshaw to discuss radiation therapy to the brain metastases.  She received her first treatment of whole brain radiation on 09/11/2019. She is scheduled through 09/24/2019   REVIEW OF SYSTEMS: Torsha wonders why we never did a brain CT or MRI before.  She thinks her prior oncologist Dr. Audelia Hives would have done it.  She also thinks may be the doctor in [place] would have done it but he is gone she says.  She is still having some headache, although that seems to be better.  She is very worried about being alone at home and generally worried about the spots in her brain as she puts it.  She is having no nausea or vomiting.  There have been no falls.  She is eating well and has not lost any weight.  A detailed review of systems today was otherwise surprisingly stable   BREAST CANCER HISTORY: From the original intake note:  The patient originally presented in May 2003 when she noticed a lump in the upper inner quadrant of her right breast in September 2003.  She sought attention and had a mammogram which showed an obvious carcinoma in the upper inner quadrant of the right breast, approximately 2 cm.  There were some enlarged lymph nodes in the axilla and an FNA done showed those consistent with malignant cells, most likely an invasive ductal carcinoma.  At that point she was unsure of what to do and was referred by Dr. [name] for a discussion of her treatment options.  By biopsy, it was ER/PR negative and HER-2 was 3+.  DNA index was 1.42.  We reiterated that it was most important to have her  disease surgically addressed at that point and had recommended lumpectomy and axillary nodes to be addressed with which she followed through and on 04-03-02, Dr. [name] performed a right partial mastectomy and a right axillary lymph node dissection.  Final pathology  revealed a 2.4 cm. high grade, Grade III invasive ductal carcinoma with an adjacent .8 cm. also high grade invasive ductal carcinoma which was felt to represent an intramammary metastasis rather than a second primary.  A smaller mass was just medial to the larger mass.  The smaller mass was associated with high grade DCIS component.  There was no definite lymphovascular invasion identified.  However, one of fourteen axillary lymph nodes did contain a 1.5 cm. metastatic deposit.  Postoperatively she did very well.  We reiterated the fact that she did need adjuvant chemotherapy, however, she refused and decided that she would pursue radiation.  She received radiation and completed that on 08-14-02. Her subsequent history is as detailed below.   PAST MEDICAL HISTORY: Past Medical History:  Diagnosis Date   Breast cancer ([place])    DVT (deep venous thrombosis) ([place]) 10/07/2011   Fever 02/06/2018   Hypertension    Radiation 11/29/2005-12/29/2005   right supraclavicular area 4147 cGy   Radiation 06/26/02-08/14/02   right breast 5040 cGy, tumor bed boosted to 1260 cGy   Rheumatic fever     PAST SURGICAL HISTORY: Past Surgical History:  Procedure Laterality Date   BREAST SURGERY     MASTECTOMY Bilateral    PORT A CATH REVISION      FAMILY HISTORY Family History  Problem Relation Age of Onset   Pancreatic cancer Mother    Kidney cancer Sister 85   Breast cancer Sister 5   Breast cancer Other 50  The patient's father died in his 07P  from complications of alcohol abuse. The patient's mother died in her 68s from pancreatic cancer. The patient has 6 brothers, 2 sisters. Both her sisters have had breast cancer, both diagnosed after the age of 66. The patient has not had genetic testing so far.   GYNECOLOGIC HISTORY: Menarche age 53; first live birth age 60. She is GXP4. She is not sure when she stopped having periods. She never used hormone replacement.   SOCIAL HISTORY: The  patient has worked in the past as a Psychologist, counselling. At home in addition to the patient are her husband [name], originally from [place], who works as a Administrator.  Also living in the home is her daughter [name] (she is studying to be a Marine scientist) and grandson.  The other 2 children are [name], who has a college degree in psychology and works as a Probation officer; and [name], who lives in [place] and works as a Higher education careers adviser. [name] also sometimes stays in the home. In addition the patient has an aide who helps her almost daily.    ADVANCED DIRECTIVES: Not in place   HEALTH MAINTENANCE: Social History   Tobacco Use   Smoking status: Light Tobacco Smoker   Smokeless tobacco: Never Used  Substance Use Topics   Alcohol use: Yes    Comment: Rarely   Drug use: No     Colonoscopy:  Not on file  PAP:  Not on file  Bone density:  Not on file  Lipid panel:  Not on file    Allergies  Allergen Reactions   Pork-Derived Products Other (See Comments)    Pt says she just doesn't eat pork; has no reaction to pork products   Tramadol Nausea  Only   Hydrocodone-Acetaminophen Itching and Rash    Current Outpatient Medications  Medication Sig Dispense Refill   ALPRAZolam (XANAX) 0.5 MG tablet Take 1 tablet (0.5 mg total) by mouth 2 (two) times daily as needed for anxiety. (Patient taking differently: Take 0.5 mg by mouth daily as needed for anxiety. ) 45 tablet 0   Ascorbic Acid (VITAMIN C) 100 MG tablet Take 100 mg by mouth daily.       cholecalciferol (VITAMIN D) 1000 units tablet Take 1 tablet (1,000 Units total) by mouth daily. 90 tablet 4   dexamethasone (DECADRON) 4 MG tablet Take 1 tablet (4 mg total) by mouth 4 (four) times daily. 90 tablet 0   gabapentin (NEURONTIN) 300 MG capsule Take 1 capsule (300 mg total) by mouth 2 (two) times daily as needed. (Patient taking differently: Take 300 mg by mouth 2 (two) times daily as needed (pain). ) 60 capsule 3   hydrOXYzine  (ATARAX/VISTARIL) 25 MG tablet TAKE 1 TABLET BY MOUTH THREE TIMES A DAY AS NEEDED (Patient taking differently: Take 25 mg by mouth 3 (three) times daily as needed for anxiety. ) 270 tablet 1   magic mouthwash SOLN SWISH AND SPIT OR SWALLOW WITH 5MLS BEFORE MEALS AND AT BEDTIME 240 mL 1   methadone (DOLOPHINE) 10 MG tablet Take 10 mg by mouth 3 (three) times daily.   0   Multiple Vitamin (MULTIVITAMIN) capsule Take 1 capsule by mouth daily.       Oxycodone HCl 20 MG TABS Take 20 mg by mouth every 4 (four) hours as needed (pain).      potassium chloride (MICRO-K) 10 MEQ CR capsule Take 2 capsules (20 mEq total) by mouth daily. 60 capsule 1   prochlorperazine (COMPAZINE) 10 MG tablet Take 1 tablet (10 mg total) by mouth every 6 (six) hours as needed for nausea or vomiting. 30 tablet 0   triamterene-hydrochlorothiazide (DYAZIDE) 37.5-25 MG capsule Take 1 each (1 capsule total) by mouth every morning. 30 capsule 3   zolpidem (AMBIEN) 10 MG tablet Take 1 tablet (10 mg total) by mouth at bedtime as needed for sleep. 30 tablet 0   No current facility-administered medications for this visit.    Facility-Administered Medications Ordered in Other Visits  Medication Dose Route Frequency Provider Last Rate Last Dose   sodium chloride 0.9 % injection 10 mL  10 mL Intravenous PRN , [name], MD   10 mL at 03/04/14 1421   sodium chloride flush (NS) 0.9 % injection 10 mL  10 mL Intracatheter PRN , [name], MD   10 mL at 07/30/19 1627    OBJECTIVE: Middle-aged African-American woman who appears stated age  63:   09/13/19 1553  BP: (!) 168/142  Pulse: 82  Resp: 20  Temp: 98 F (36.7 C)  SpO2: 99%   Wt Readings from Last 3 Encounters:  09/13/19 196 lb 11.2 oz (89.2 kg)  09/05/19 202 lb (91.6 kg)  09/04/19 202 lb (91.6 kg)   Body mass index is 32.73 kg/m.    ECOG FS:1 - Symptomatic but completely ambulatory  Sclerae unicteric, EOMs intact Wearing a mask, on and  off No cervical or supraclavicular adenopathy Lungs no rales or rhonchi Heart regular rate and rhythm Abd soft, nontender, positive bowel sounds MSK right upper extremity lymphedema, chronic, with tumor lesions on the right anterior and posterior chest as well as the right upper extremity Neuro: nonfocal, well oriented, appropriate affect Breasts: Status post bilateral mastectomy.  Photo 07/04/2017  Photo 08/23/2018    LAB RESULTS:.  Lab Results  Component Value Date   WBC 5.1 09/04/2019   NEUTROABS 3.6 07/30/2019   HGB 11.5 (L) 09/04/2019   HCT 37.5 09/04/2019   MCV 87.2 09/04/2019   PLT 290 09/04/2019      Chemistry      Component Value Date/Time   NA 137 09/04/2019 2325   NA 142 11/09/2017 1214   K 3.7 09/04/2019 2325   K 3.9 11/09/2017 1214   CL 101 09/04/2019 2325   CL 101 03/07/2013 1411   CO2 29 09/04/2019 2325   CO2 32 (H) 11/09/2017 1214   BUN 20 09/04/2019 2325   BUN 19.1 11/09/2017 1214   CREATININE 0.91 09/04/2019 2325   CREATININE 1.05 (H) 07/30/2019 1115   CREATININE 1.0 11/09/2017 1214      Component Value Date/Time   CALCIUM 9.1 09/04/2019 2325   CALCIUM 9.1 11/09/2017 1214   ALKPHOS 132 (H) 07/30/2019 1115   ALKPHOS 120 11/09/2017 1214   AST 27 07/30/2019 1115   AST 30 11/09/2017 1214   ALT 17 07/30/2019 1115   ALT 19 11/09/2017 1214   BILITOT <0.2 (L) 07/30/2019 1115   BILITOT <0.22 11/09/2017 1214       STUDIES: Mr Jeri Cos HM Contrast  Result Date: 09/04/2019 CLINICAL DATA:  Breast cancer staging with worsening headaches EXAM: MRI HEAD WITHOUT AND WITH CONTRAST TECHNIQUE: Multiplanar, multiecho pulse sequences of the brain and surrounding structures were obtained without and with intravenous contrast. CONTRAST:  48m GADAVIST GADOBUTROL 1 MMOL/ML IV SOLN COMPARISON:  Head CT 02/06/2018 Brain MRI 02/26/2008 FINDINGS: BRAIN: There are masses within the right frontal and temporal lobes. The frontal lobe mass measures 4.6 x 3.4 cm.  The temporal lobe mass is more irregular but measures approximately 6.2 x 2.8 cm. The temporal lobe mass has a greater solid component than the largely necrotic frontal mass. There is moderate edema surrounding both masses. Anteriorly, there is leftward midline shift of approximately 4 mm. Elsewhere, the white matter signal is normal for age aside from minimal multifocal white matter hyperintensity. No hydrocephalus. There are small amounts of blood within both masses. There is a partially empty sella incidentally noted. VASCULAR: The major intracranial arterial and venous sinus flow voids are normal. Susceptibility-sensitive sequences show no chronic microhemorrhage or superficial siderosis. SKULL AND UPPER CERVICAL SPINE: Calvarial bone marrow signal is normal. There is no skull base mass. The visualized upper cervical spine and soft tissues are normal. SINUSES/ORBITS: There are no fluid levels or advanced mucosal thickening. The mastoid air cells and middle ear cavities are free of fluid. The orbits are normal. IMPRESSION: 1. Multiple right frontal and temporal lobe masses, most consistent with metastatic disease. 2. Associated mass effect with approximately 4 mm leftward midline shift. This examination was performed as a routine outpatient scan. During the study, the patient complained headache and demonstrated some decreased alertness. She was transferred to the ER for further assessment and is there at the time of this dictation. Electronically Signed   By: KUlyses JarredM.D.   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 EXAM: NUCLEAR MEDICINE PET SKULL BASE TO THIGH TECHNIQUE: 9.4 mCi F-18 FDG was injected intravenously. Full-ring PET imaging was performed from the skull base to thigh after the radiotracer. CT data was obtained and used for  attenuation correction and anatomic localization. Fasting blood glucose: 100 mg/dl COMPARISON:  02/25/2014 FINDINGS: Mediastinal blood pool activity: SUV max 2.4 Liver activity: SUV max NA NECK: Anteriorly in the right middle cranial fossa, a hypermetabolic lesion measuring about 5.0 by 2.4 by 3.4 cm is present, maximum SUV 21.5. Possible photopenic lesion in the right frontal lobe region. Neither of these are really well appreciated on the CT data. There is bilateral level IIa, and left level IIb hypermetabolic adenopathy in the neck. An indistinctly marginated left level IIa lymph node probably measuring about 0.8 cm in short axis on image 34/4 has a maximum SUV 9.3. A left level IIb lymph node measuring 0.9 cm in short axis on image 34/4 has a maximum SUV of 9.0. A small right focus of accentuated metabolic activity in the vicinity of the right level 2 chain has a maximum SUV of 5.9. There is also some mildly asymmetric accentuated activity along the right palatine tonsil, maximum SUV 5.6 compared to the left 3.7, significance uncertain. Mildly asymmetric glottic activity, with the right cord demonstrating mildly accentuated activity compared to the left. Incidental CT findings: none CHEST: There is a patch of subcutaneous infiltration along the right upper back with foci accentuated metabolic activity internally. Most prominent of these is somewhat centrally within this region, measuring about 1.5 cm in diameter with maximum SUV 12.2. Further caudad there is similar subcutaneous infiltration as shown on image 78/4 where a 5.7 by 1.9 cm lesion in the subcutaneous tissues has a maximum SUV of 19.6. A left axillary node measuring 1.4 cm in short axis on image 56/4 has a maximum SUV of 2.0. Incidental CT findings: Radiation therapy related findings anteriorly in the right chest. Subcutaneous stranding in the right upper arm for example on image 19/4. Bilateral mastectomies, atrophic pectoralis musculature on the  right. ABDOMEN/PELVIS: Hypermetabolic retroperitoneal, bilateral common iliac, left external iliac, and bilateral inguinal adenopathy. A left periaortic lymph node measuring 1.3 cm in short axis on image 129/4 has a maximum SUV of 14.9. Index left external iliac node 0.9 cm in short axis on image 160/4, maximum SUV 11.7. Index left inguinal lymph node 1.6 cm in short axis on image 172/4, maximum SUV 11.9. Indistinct celiac node with maximum SUV of 4.8. Incidental CT findings: Prominence of the common bile duct at 1.1 cm in diameter, cause uncertain. Aortoiliac atherosclerotic vascular disease. Pelvic floor laxity with cystocele extending 1.5 cm below the pubococcygeal line. SKELETON: No significant abnormal hypermetabolic activity in this region. Incidental CT findings: none IMPRESSION: 1. Hypermetabolic bilateral internal jugular, retroperitoneal, and pelvic adenopathy compatible with active malignancy. 2. 5.0 by 2.4 by 3.4 cm hypermetabolic intracranial lesion anteriorly in the right middle cranial fossa. Little in the way of correlative findings on the CT. This could be metastatic. Brain MRI with and without contrast is recommended, if not feasible than consider CT of the head with and without contrast. 3. They are patches of subcutaneous infiltration along the right back with hypermetabolic activity, concerning for subcutaneous malignancy. 4. Mildly asymmetric palatine tonsillar activity and glottic activity, probably incidental. 5. Other imaging findings of potential clinical significance: Dilated common bile duct, cause uncertain. Aortic Atherosclerosis (ICD10-I70.0). Pelvic floor laxity with cystocele. Electronically Signed   By: Van Clines [name]   On: 08/20/2019 16:39     ASSESSMENT:  63 y.o. [place] woman with stage IV breast cancer manifested chiefly by loco-regional nodal disease (neck, chest) and skin involvement, without liver, bone, or brain metastases documented   (1)  Status post right  upper inner quadrant lumpectomy and sentinel lymph node sampling 03/04/2002 for 2 separate foci of invasive ductal carcinoma, mpT2 pN1 or stage IIB, both foci grade 3, both estrogen and progesterone receptor positive, both HercepTest 3+, Mib-1 56%  (2) Reexcision for margins 05/27/2002 showed no residual cancer in the breast.  (3) The patient refused adjuvant systemic therapy.  (4) Adjuvant radiation treatment completed 08/14/2002.  (5) recurrence in the right breast in 02/2004 showing a morphologically different tumor, again grade 3, again estrogen and progesterone receptor negative, with an MIB-1 of 14% and Herceptest 3+.  (5) Between 03/2004 and 07/2004 she received dose dense Doxorubicin/Cyclophosphamide x 4 given with trastuzumab, followed by weekly Docetaxel x 8, again given with trastuzumab.  (6) Right mastectomy 07/13/2004 showed scattered microscopic foci of residual disease over an area  greater than 5 cm. Margins were negative.  (7) Postoperative Docetaxel continued until 09/2004.  (8) Trastuzumab (Herceptin) given 08/2004 through 01/2012 with some brief interruptions.  RECURRENT/ STAGE IV DISEASE December 2006 (9) Isolated right cervical nodal recurrence 10/2005, treated with radiation to the right supraclavicular area (total 41.5 gray) completed 12/29/2005.  (10) Navelbine given together with Herceptin  11/2005 through 03/2006.  (9) Left mastectomy 02/13/2006 for ductal carcinoma in situ, grade 2, estrogen and progesterone receptor negative, with negative margins; 0 of 3 lymph nodes involved  (10) treated with Lapatinib and Capecitabine before 10/2009, for an unclear duration and with unclear results (cannot locate data on chart review).  (11) Status post right supraclavicular lymph node biopsy 09/2010 again positive for an invasive ductal carcinoma, estrogen and progesterone receptor negative, HER-2 positive by CISH with a ratio 4.25.  (12) Navelbine given together with  Herceptin between 05/2011 and 11/2011.  (13) Carboplatin/ Gemcitabine/ Herceptin given for 2 cycles, in 12/2011 and 01/2012.  (14) TDM-1 ([place]) started 02/2012. Last dose 10/02/2013 after which the patient discontinued treatment at her own discretion. Echo on December 2014 showed a well preserved ejection fraction.  (15) Deep vein thrombosis of the right upper extremity documented 04/20/2011.  She completed anticoagulant therapy with Coumadin on 03/25/2013.  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 Left chest Port-A-Cath placed on 04/08/2013; being flushed every 6 weeks  RIGHT UPPER EXTREMITY ANGIOSARCOMA VS BREAST CANCER: August 2016 (18) treated at [facility] of [place] August 2016 with paclitaxel, trastuzumab and pertuzumab 1 cycle, afterwards referred to hospice  (19) under the care of hospice of [facility] August 2016 to 05/08/2016, when the patient opted for a second try at chemotherapy  (20) started low-dose Abraxane, trastuzumab and pertuzumab 06/07/2016, to be repeated every 4 weeks.   (a) pretreatment echocardiogram 06/06/2016 showed a 60-65% ejection fraction  (b) significant compliance problems compromise response to treatment  (c)  echocardiogram 02/20/17 LVEF 55-60%  (d) Abraxane discontinued 07/04/2017 because of possible neuropathy symptoms  (e) Abraxane resumed 09/27/2017  (f) echocardiogram 07/20/2017 showed an ejection fraction of 60-65%  (g) echo 01/22/2018 showed an ejection fraction of 50-55%  (h) CT scan of the chest 05/03/2018 shows no disease involving the lungs liver or lymph nodes, with stable subcutaneous masses as previously noted  (I) Abraxane discontinued August 2019 because of concerns regarding neuropathy--  gemcitabine substituted  (j) echocardiogram 08/20/2018 shows an ejection fraction in the 55-60% range (done every 6 months)  (  k) gemcitabine discontinued because of multiple symptoms, Abraxane restarted 09/11/2018, given once every 4 weeks  (l) echocardiogram 07/17/2019 shows a well-preserved ejection fraction at 55-60%  (21) Chronic pain secondary to known metastatic disease  (a) patient signed pain contract 10/19/2016  (b) as of 11/16/2016 receives her pain medications from Dr [name]  (c)  Dr [name] no longer able to prescribe as of February 2019  (d) all pain medications now through [facility] (as of April 2019)  (22) left breast and left axillary adenopathy noted clinically  (a) left axillary lymph node biopsy on 07/24/2019 benign  (23) frontal and temporal lobe metastases noted on brain MRI 09/04/2019  (a) adjuvant radiation ongoing.  (24) restaging PET scan 08/20/2019 shows hypermetabolic adenopathy, subcutaneous malignancy as clinically appreciated, but no liver or lung parenchymal lesions   PLAN: Ruby is now nearly 14 years out from definitive diagnosis of metastatic breast cancer.  Her peripheral disease is moderately well controlled on her current therapy and she maintains a fair functional status.  Accordingly the plan as far as out of concern is to continue paclitaxel and trastuzumab/pertuzumab every 28 days as before.  She now has multiple brain lesions.  She seemed surprised to hear that this was cancer in her brain.  We discussed the fact that this is the same cancer that she has elsewhere.  She understands that the chemotherapy and immunotherapy does not cross over into the brain and that is the reason she needs radiation.  She will meet with our neuro oncologist Dr. [name] next week.  We can consider tucatinib or other interventions at his discretion.  I am holding her peripheral treatment until 10/08/2019 to avoid increasing toxicities from her  radiation.  Nycole requested a handicap sticker for her car and I was glad to get that for her.  I have not been able to clarify advanced directives with her in the past.  We will try again when she returns to see me in December  She knows to call for any other issues that may develop before that visit.  Lonya Johannesen, [name], MD  09/13/19 4:29 PM Medical Oncology and Hematology [facility] [place], [place] [address] Tel. [phone]    Fax. [phone]   I, Wilburn Mylar, am acting as scribe for Dr. [name]. Markos Theil.  I, Lurline Del MD, have reviewed the above documentation for accuracy and completeness, and I agree with the above.

## 2019-09-13 ENCOUNTER — Inpatient Hospital Stay: Payer: Medicare Other | Attending: Oncology | Admitting: Oncology

## 2019-09-13 ENCOUNTER — Other Ambulatory Visit: Payer: Self-pay | Admitting: Radiation Oncology

## 2019-09-13 ENCOUNTER — Ambulatory Visit
Admission: RE | Admit: 2019-09-13 | Discharge: 2019-09-13 | Disposition: A | Discharge status: Home / Self Care | Payer: Medicare Other | Source: Ambulatory Visit | Attending: Radiation Oncology | Admitting: Radiation Oncology

## 2019-09-13 ENCOUNTER — Other Ambulatory Visit: Payer: Self-pay

## 2019-09-13 VITALS — BP 168/142 | HR 82 | Temp 98.0°F | Resp 20 | Ht 65.0 in | Wt 196.7 lb

## 2019-09-13 DIAGNOSIS — C7981 Secondary malignant neoplasm of breast: Secondary | ICD-10-CM | POA: Insufficient documentation

## 2019-09-13 DIAGNOSIS — C770 Secondary and unspecified malignant neoplasm of lymph nodes of head, face and neck: Secondary | ICD-10-CM | POA: Insufficient documentation

## 2019-09-13 DIAGNOSIS — C7931 Secondary malignant neoplasm of brain: Secondary | ICD-10-CM

## 2019-09-13 DIAGNOSIS — Z170 Estrogen receptor positive status [ER+]: Secondary | ICD-10-CM

## 2019-09-13 DIAGNOSIS — Z9221 Personal history of antineoplastic chemotherapy: Secondary | ICD-10-CM | POA: Insufficient documentation

## 2019-09-13 DIAGNOSIS — C50211 Malignant neoplasm of upper-inner quadrant of right female breast: Secondary | ICD-10-CM | POA: Diagnosis not present

## 2019-09-13 DIAGNOSIS — Z923 Personal history of irradiation: Secondary | ICD-10-CM | POA: Insufficient documentation

## 2019-09-13 DIAGNOSIS — Z9013 Acquired absence of bilateral breasts and nipples: Secondary | ICD-10-CM | POA: Diagnosis not present

## 2019-09-13 DIAGNOSIS — C4911 Malignant neoplasm of connective and soft tissue of right upper limb, including shoulder: Secondary | ICD-10-CM

## 2019-09-13 DIAGNOSIS — Z79899 Other long term (current) drug therapy: Secondary | ICD-10-CM | POA: Diagnosis not present

## 2019-09-13 DIAGNOSIS — Z86718 Personal history of other venous thrombosis and embolism: Secondary | ICD-10-CM | POA: Insufficient documentation

## 2019-09-13 MED ORDER — ZOLPIDEM TARTRATE 10 MG PO TABS: 10.0000 mg | ORAL_TABLET | Freq: Every evening | ORAL | 0 refills | Status: DC | PRN

## 2019-09-13 MED ORDER — DEXAMETHASONE 4 MG PO TABS: 4.0000 mg | ORAL_TABLET | Freq: Four times a day (QID) | ORAL | 0 refills | Status: DC

## 2019-09-16 ENCOUNTER — Ambulatory Visit: Payer: Medicare Other | Admitting: Internal Medicine

## 2019-09-16 ENCOUNTER — Inpatient Hospital Stay: Payer: Medicare Other | Admitting: Internal Medicine

## 2019-09-16 ENCOUNTER — Ambulatory Visit
Admission: RE | Admit: 2019-09-16 | Discharge: 2019-09-16 | Disposition: A | Discharge status: Home / Self Care | Payer: Medicare Other | Source: Ambulatory Visit | Attending: Radiation Oncology | Admitting: Radiation Oncology

## 2019-09-16 DIAGNOSIS — C7931 Secondary malignant neoplasm of brain: Secondary | ICD-10-CM | POA: Diagnosis not present

## 2019-09-17 ENCOUNTER — Telehealth: Payer: Self-pay | Admitting: Oncology

## 2019-09-17 ENCOUNTER — Ambulatory Visit: Payer: Medicare Other

## 2019-09-17 NOTE — Telephone Encounter (Signed)
Left message on both home/cell regarding next appointment for 12/1. Appointments for 11/17 have been cancelled. Schedule mailed.

## 2019-09-18 ENCOUNTER — Ambulatory Visit
Admission: RE | Admit: 2019-09-18 | Discharge: 2019-09-18 | Disposition: A | Discharge status: Home / Self Care | Payer: Medicare Other | Source: Ambulatory Visit | Attending: Radiation Oncology | Admitting: Radiation Oncology

## 2019-09-18 DIAGNOSIS — C7931 Secondary malignant neoplasm of brain: Secondary | ICD-10-CM | POA: Diagnosis not present

## 2019-09-19 ENCOUNTER — Ambulatory Visit: Payer: Medicare Other

## 2019-09-20 ENCOUNTER — Ambulatory Visit: Payer: Medicare Other

## 2019-09-23 ENCOUNTER — Ambulatory Visit: Payer: Medicare Other

## 2019-09-24 ENCOUNTER — Ambulatory Visit (HOSPITAL_COMMUNITY)
Admission: EM | Admit: 2019-09-24 | Discharge: 2019-09-24 | Disposition: A | Discharge status: Home / Self Care | Payer: Medicare Other | Attending: Urgent Care | Admitting: Urgent Care

## 2019-09-24 ENCOUNTER — Other Ambulatory Visit: Payer: Medicare Other

## 2019-09-24 ENCOUNTER — Encounter (HOSPITAL_COMMUNITY): Payer: Self-pay

## 2019-09-24 ENCOUNTER — Ambulatory Visit: Payer: Medicare Other

## 2019-09-24 ENCOUNTER — Ambulatory Visit (INDEPENDENT_AMBULATORY_CARE_PROVIDER_SITE_OTHER): Payer: Medicare Other

## 2019-09-24 ENCOUNTER — Other Ambulatory Visit: Payer: Self-pay

## 2019-09-24 DIAGNOSIS — R062 Wheezing: Secondary | ICD-10-CM | POA: Insufficient documentation

## 2019-09-24 DIAGNOSIS — Z79899 Other long term (current) drug therapy: Secondary | ICD-10-CM | POA: Insufficient documentation

## 2019-09-24 DIAGNOSIS — R4182 Altered mental status, unspecified: Secondary | ICD-10-CM | POA: Diagnosis not present

## 2019-09-24 DIAGNOSIS — C7931 Secondary malignant neoplasm of brain: Secondary | ICD-10-CM | POA: Insufficient documentation

## 2019-09-24 DIAGNOSIS — Z86718 Personal history of other venous thrombosis and embolism: Secondary | ICD-10-CM | POA: Insufficient documentation

## 2019-09-24 DIAGNOSIS — F17210 Nicotine dependence, cigarettes, uncomplicated: Secondary | ICD-10-CM | POA: Diagnosis not present

## 2019-09-24 DIAGNOSIS — R0602 Shortness of breath: Secondary | ICD-10-CM

## 2019-09-24 DIAGNOSIS — Z20828 Contact with and (suspected) exposure to other viral communicable diseases: Secondary | ICD-10-CM | POA: Diagnosis not present

## 2019-09-24 DIAGNOSIS — Z170 Estrogen receptor positive status [ER+]: Secondary | ICD-10-CM | POA: Diagnosis not present

## 2019-09-24 DIAGNOSIS — I10 Essential (primary) hypertension: Secondary | ICD-10-CM | POA: Insufficient documentation

## 2019-09-24 DIAGNOSIS — R05 Cough: Secondary | ICD-10-CM

## 2019-09-24 DIAGNOSIS — Z803 Family history of malignant neoplasm of breast: Secondary | ICD-10-CM | POA: Diagnosis not present

## 2019-09-24 DIAGNOSIS — R0981 Nasal congestion: Secondary | ICD-10-CM | POA: Insufficient documentation

## 2019-09-24 DIAGNOSIS — C50211 Malignant neoplasm of upper-inner quadrant of right female breast: Secondary | ICD-10-CM | POA: Diagnosis not present

## 2019-09-24 DIAGNOSIS — J029 Acute pharyngitis, unspecified: Secondary | ICD-10-CM | POA: Insufficient documentation

## 2019-09-24 DIAGNOSIS — R059 Cough, unspecified: Secondary | ICD-10-CM

## 2019-09-24 LAB — POCT RAPID STREP A: Streptococcus, Group A Screen (Direct): NEGATIVE

## 2019-09-24 MED ORDER — MAGIC MOUTHWASH W/LIDOCAINE: 5.0000 mL | Freq: Three times a day (TID) | ORAL | 0 refills | Status: DC | PRN

## 2019-09-24 MED ORDER — DEXAMETHASONE SODIUM PHOSPHATE 10 MG/ML IJ SOLN
INTRAMUSCULAR | Status: AC
  Filled 2019-09-24: qty 1

## 2019-09-24 MED ORDER — DEXAMETHASONE SODIUM PHOSPHATE 10 MG/ML IJ SOLN
10.0000 mg | Freq: Once | INTRAMUSCULAR | Status: AC
  Administered 2019-09-24: 10 mg via INTRAMUSCULAR

## 2019-09-24 NOTE — Discharge Instructions (Addendum)
Your x ray did not show any bacterial infection Most likely this is viral illness  There is a high possibility for Covid We will have your Covid results in a couple days.  We will call you with result was positive If your symptoms worsen you need to go to the ER. Your vital signs are normal here today and I feel like it is safe for you to go home at this time.

## 2019-09-24 NOTE — ED Triage Notes (Signed)
Pt presents to the UC with sore throat, nasal congestion and cough x 2-3  days. Pt denies any other symptoms.

## 2019-09-25 ENCOUNTER — Ambulatory Visit: Payer: Medicare Other

## 2019-09-25 ENCOUNTER — Telehealth: Payer: Self-pay | Admitting: Radiation Oncology

## 2019-09-25 ENCOUNTER — Telehealth: Payer: Self-pay | Admitting: Emergency Medicine

## 2019-09-25 ENCOUNTER — Telehealth: Payer: Self-pay | Admitting: *Deleted

## 2019-09-25 MED ORDER — FLUCONAZOLE 100 MG PO TABS: 100.0000 mg | ORAL_TABLET | Freq: Every day | ORAL | 0 refills | Status: DC

## 2019-09-25 MED ORDER — MAGIC MOUTHWASH: ORAL | 1 refills | Status: DC

## 2019-09-25 NOTE — Telephone Encounter (Signed)
This RN spoke with pt per her call stating " I am sick as a dog".  [name] is requesting a refill on her MMW- stating her esophagus hurts- she denies any temps greater then 100.5.  She states she is very fatigue and " only getting out of bed to take care of myself in the bathroom and then I go back to bed "  She states she is continuing on the steroids " but the other nurse told me I could stop them next week "  This RN reiterated need to take the steroids as prescribed- due to stopping on her own can have multiple issues and side effects that could be very detrimental for her.  She states she went to the Urgent Care yesterday " and they told me to just go home and rest "  [name] stated " that radiation is making me sick - I do not want to do anymore of it "  She has not spoken with Rad Onc.  This RN validated her issues but also reiterated goal of radiation and not completing may not give her the best outcome.  [name] stated " I will just pray and let God take care of it "  This RN again validated her statement but also that this office cares greatly for her- and wants to help and support her.  [name] stated appreciation and was receptive to coming in for [facility] for evaluation this RN called to discuss with nurse- but then per review it was noted pt was Covid tested at Urgent Care with pending results.  Per return call to [facility] above discussed with [name] stating " she told me it was negative and just go home and rest "  This RN informed her results still pending and therefore due to test being done for symptoms she needs to stay at home until results are available.  This RN discussed her current issues and how we could manage them at home -   MMW will be refilled as well as diflucan will be ordered for possible thrush secondary to steroid use.  This RN also reiterated to [name] need for her to call this office for issues first rather then go to the ER or Urgent Care due to  this office is actively treating her and can manage her care best or we will send where is better for her.  [name] verbalized understanding.  This RN will call pt in the AM to follow up.  This note will be sent to the attending providers for review of communication and current appointments.

## 2019-09-25 NOTE — ED Provider Notes (Signed)
[place]    CSN: [id number] Arrival date & time: 09/24/19  1248      History   Chief Complaint Chief Complaint  Patient presents with  . Sore Throat  . Nasal Congestion  . Cough    HPI [name] is a 63 y.o. female.   Patient is a 63 year old female with past medical history of breast cancer, DVT, hypertension, metastasis to brain, altered mental status, rheumatic fever, current radiation treatment, bacteremia, CAP, DVT, Port-A-Cath placement, GERD.  She presents today with approximate 2 to 3 days of sore throat, nasal congestion, cough.  Symptoms have been constant worsening.  Pain with swallowing.  She has been using Chloraseptic spray without much relief.  Low-grade fever here today.  Denies any known fevers at home.  Patient undergoing chemo currently.  ROS per HPI      Past Medical History:  Diagnosis Date  . Breast cancer ([place])   . DVT (deep venous thrombosis) ([place]) 10/07/2011  . Fever 02/06/2018  . Hypertension   . Radiation 11/29/2005-12/29/2005   right supraclavicular area 4147 cGy  . Radiation 06/26/02-08/14/02   right breast 5040 cGy, tumor bed boosted to 1260 cGy  . Rheumatic fever     Patient Active Problem List   Diagnosis Date Noted  . Metastasis to brain ([place]) 09/13/2019  . Acute lower UTI 02/06/2018  . Altered mental status 02/06/2018  . Bacteremia 02/06/2018  . Polypharmacy 02/06/2018  . Goals of care, counseling/discussion 09/05/2017  . Pneumonia 01/25/2017  . CAP (community acquired pneumonia) 01/23/2017  . Port catheter in place 05/30/2016  . 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 Chronic pain 04/13/2015  . Left arm pain 08/18/2014  . Malignant neoplasm of upper-inner quadrant of right breast in female, estrogen receptor positive ([place]) 10/03/2013  . Post-lymphadenectomy lymphedema of arm 08/15/2013  . Port or reservoir infection 01/29/2013  . DVT (deep venous thrombosis) ([place]) 10/07/2011  . Chest  pain 09/10/2009  . Secondary cardiomyopathy ([place]) 09/02/2009  . Essential hypertension 02/26/2009  . GERD 02/26/2009    Past Surgical History:  Procedure Laterality Date  . BREAST SURGERY    . MASTECTOMY Bilateral   . PORT A CATH REVISION      OB History   No obstetric history on file.      Home Medications    Prior to Admission medications   Medication Sig Start Date End Date Taking? Authorizing Provider  ALPRAZolam Duanne Moron) 0.5 MG tablet Take 1 tablet (0.5 mg total) by mouth 2 (two) times daily as needed for anxiety. Patient taking differently: Take 0.5 mg by mouth daily as needed for anxiety.  09/11/18   [name], [name], MD  Ascorbic Acid (VITAMIN C) 100 MG tablet Take 100 mg by mouth daily.      [provider]  cholecalciferol (VITAMIN D) 1000 units tablet Take 1 tablet (1,000 Units total) by mouth daily. 05/16/18   [name], [name], MD  dexamethasone (DECADRON) 4 MG tablet Take 1 tablet (4 mg total) by mouth 4 (four) times daily. 09/13/19   Kyung Rudd, MD  fluconazole (DIFLUCAN) 100 MG tablet Take 1 tablet (100 mg total) by mouth daily. 09/25/19   [name], [name], MD  gabapentin (NEURONTIN) 300 MG capsule Take 1 capsule (300 mg total) by mouth 2 (two) times daily as needed. Patient taking differently: Take 300 mg by mouth 2 (two) times daily as needed (pain).  07/25/19   [name], [name], MD  hydrOXYzine (ATARAX/VISTARIL) 25  MG tablet TAKE 1 TABLET BY MOUTH THREE TIMES A DAY AS NEEDED Patient taking differently: Take 25 mg by mouth 3 (three) times daily as needed for anxiety.  08/21/18   [name], [name], MD  magic mouthwash SOLN SWISH AND SPIT OR SWALLOW WITH 5MLS BEFORE MEALS AND AT BEDTIME 09/25/19   [name], [name], MD  methadone (DOLOPHINE) 10 MG tablet Take 10 mg by mouth 3 (three) times daily.  05/29/18   [provider]  Multiple Vitamin (MULTIVITAMIN) capsule Take 1 capsule by mouth daily.      [provider]  Oxycodone HCl 20  MG TABS Take 20 mg by mouth every 4 (four) hours as needed (pain).  08/13/19   [provider]  potassium chloride (MICRO-K) 10 MEQ CR capsule Take 2 capsules (20 mEq total) by mouth daily. 08/16/19   [name], [name], MD  prochlorperazine (COMPAZINE) 10 MG tablet Take 1 tablet (10 mg total) by mouth every 6 (six) hours as needed for nausea or vomiting. 06/11/19   [name], [name], MD  triamterene-hydrochlorothiazide (DYAZIDE) 37.5-25 MG capsule Take 1 each (1 capsule total) by mouth every morning. 07/30/19   [name], [name], MD  zolpidem (AMBIEN) 10 MG tablet Take 1 tablet (10 mg total) by mouth at bedtime as needed for sleep. 09/13/19 10/13/19  Kyung Rudd, MD    Family History Family History  Problem Relation Age of Onset  . Pancreatic cancer Mother   . Kidney cancer Sister 61  . Breast cancer Sister 30  . Breast cancer Other 64    Social History Social History   Tobacco Use  . Smoking status: Light Tobacco Smoker  . Smokeless tobacco: Never Used  Substance Use Topics  . Alcohol use: Yes    Comment: Rarely  . Drug use: No     Allergies   Pork-derived products, Tramadol, and Hydrocodone-acetaminophen   Review of Systems Review of Systems   Physical Exam Triage Vital Signs ED Triage Vitals  Enc Vitals Group     BP 09/24/19 1423 (!) 113/58     Pulse Rate 09/24/19 1423 83     Resp 09/24/19 1423 18     Temp 09/24/19 1423 99.1 F (37.3 C)     Temp Source 09/24/19 1423 Oral     SpO2 09/24/19 1423 98 %     Weight --      Height --      Head Circumference --      Peak Flow --      Pain Score 09/24/19 1420 8     Pain Loc --      Pain Edu? --      Excl. in [place]? --    No data found.  Updated Vital Signs BP (!) 113/58 (BP Location: Left Arm)   Pulse 83   Temp 99.1 F (37.3 C) (Oral)   Resp 18   SpO2 98%   Visual Acuity Right Eye Distance:   Left Eye Distance:   Bilateral Distance:    Right Eye Near:   Left Eye Near:    Bilateral Near:      Physical Exam Vitals signs and nursing note reviewed.  Constitutional:      General: She is not in acute distress.    Appearance: She is well-developed. She is ill-appearing. She is not toxic-appearing or diaphoretic.     Comments: Poor historian  HENT:     Head: Normocephalic and atraumatic.     Right Ear: Tympanic membrane  and ear canal normal.     Left Ear: Tympanic membrane and ear canal normal.     Mouth/Throat:     Pharynx: Uvula midline. Posterior oropharyngeal erythema present.     Tonsils: No tonsillar exudate. 1+ on the right. 1+ on the left.     Comments: White patches on tongue Neck:     Musculoskeletal: Normal range of motion.  Cardiovascular:     Rate and Rhythm: Normal rate.     Pulses: Normal pulses.     Heart sounds: Normal heart sounds.  Pulmonary:     Breath sounds: Wheezing present.  Musculoskeletal: Normal range of motion.  Skin:    General: Skin is warm and dry.  Neurological:     Mental Status: She is alert.  Psychiatric:        Mood and Affect: Mood normal.      UC Treatments / Results  Labs (all labs ordered are listed, but only abnormal results are displayed) Labs Reviewed  CULTURE, GROUP A STREP ([place])  NOVEL CORONAVIRUS, NAA (HOSP ORDER, SEND-OUT TO REF LAB; TAT 18-24 HRS)  POCT RAPID STREP A    EKG   Radiology Dg Chest 2 View  Result Date: 09/24/2019 CLINICAL DATA:  Cough, wheezing, shortness of breath EXAM: CHEST - 2 VIEW COMPARISON:  02/06/2018 FINDINGS: Mild cardiomegaly. Left chest port catheter. Mild, diffuse interstitial pulmonary opacity. Disc degenerative disease of the thoracic spine. IMPRESSION: Mild, diffuse interstitial pulmonary opacity, suggestive of edema in the setting of cardiomegaly or alternately atypical/viral infection. No focal airspace opacity. Electronically Signed   By: Eddie Candle [name]   On: 09/24/2019 15:42    Procedures Procedures (including critical care time)  Medications Ordered in UC Medications   dexamethasone (DECADRON) injection 10 mg (10 mg Intramuscular Given 09/24/19 1623)  dexamethasone (DECADRON) 10 MG/ML injection (has no administration in time range)    Initial Impression / Assessment and Plan / UC Course  I have reviewed the triage vital signs and the nursing notes.  Pertinent labs & imaging results that were available during my care of the patient were reviewed by me and considered in my medical decision making (see chart for details).       Oral candidiasis-treating with Magic mouthwash with lidocaine for pain and infection Steroid injection given here in clinic for tonsillar pain and swelling. Patient has likely has some sort of viral illness.  X-ray results revealed Mild, diffuse interstitial pulmonary opacity, suggestive of edema in the setting of cardiomegaly or alternately atypical/viral infection. No focal airspace opacity Covid swab pending.  Highly suspicious for this. Patient otherwise stable with vital signs normal today here in clinic.  She is not hypoxic.  She has a low-grade temperature. She is currently alert and oriented.  Sats 98% and not hypoxic. Return precautions given.  Recommend follow-up with her oncologist For worsening symptoms she will need to go to the ER. Final Clinical Impressions(s) / UC Diagnoses   Final diagnoses:  Cough  SOB (shortness of breath)     Discharge Instructions     Your x ray did not show any bacterial infection Most likely this is viral illness  There is a high possibility for Covid We will have your Covid results in a couple days.  We will call you with result was positive If your symptoms worsen you need to go to the ER. Your vital signs are normal here today and I feel like it is safe for you to go home at this time.  ED Prescriptions    Medication Sig Dispense Auth. Provider   magic mouthwash w/lidocaine SOLN Take 5 mLs by mouth 3 (three) times daily as needed for mouth pain. 100 mL Loura Halt A, NP      PDMP not reviewed this encounter.   Loura Halt A, NP 09/25/19 1100

## 2019-09-25 NOTE — Telephone Encounter (Signed)
Spoke with RN Val about pt coming in to [facility] before her radiation appt this afternoon for fatigue and possible dehydration.  Pt seen in urgent care yesterday (09/24/2019) for SOB, cough, sore throat, and runny nose where she was given a Covid-19 test that is still pending results.  PA [name] aware, per PA at this time the patient cannot be seen in [facility] d/t pending test and reported symptoms.  RN Val made aware who states she will contact pt about cancelling appts today.

## 2019-09-26 ENCOUNTER — Telehealth: Payer: Self-pay | Admitting: *Deleted

## 2019-09-26 ENCOUNTER — Ambulatory Visit: Payer: Medicare Other

## 2019-09-26 ENCOUNTER — Encounter: Payer: Self-pay | Admitting: General Practice

## 2019-09-26 LAB — NOVEL CORONAVIRUS, NAA (HOSP ORDER, SEND-OUT TO REF LAB; TAT 18-24 HRS): SARS-CoV-2, NAA: NOT DETECTED

## 2019-09-26 NOTE — Telephone Encounter (Signed)
This RN called pt to check on her status- noted Covid test results still pending.  Received automated message mailbox is full - unable to leave message.  Noted Urgent Care note available for review with noted xray and symptoms " highly suspicious for Covid ".  MD aware of above.

## 2019-09-26 NOTE — Telephone Encounter (Signed)
[name] returned call to this Therapist, sports.  She states " I am feeling much better after taking that pill you sent with the mouthwash ( diflucan ).  This RN discussed Covid results still pending and need to stay at home at this time.  This RN also reviewed her dose of decadron per note by Rad Onc of taper.  [name] states " I am taking just 4 mg once a day "  This RN asked what she as taking prior to call - with [name] stating " I was taking it four times a day "  This RN informed pt of need to take 1 tablet ( 4 mg ) twice a day - discussed why taper is done slowly with [name] stating she understands " ok - I will take it twice a day ".  [name] asked about " when am I supposed to start those chemo pills [name] told me about "  This RN informed her further chemo will be discussed post completion of radiation therapy - [name] stated " I don't want to do anymore radiation "  This RN informed pt presently plan is to get Covid test results- if negative then further therapy can be discussed for best outcome.  [name] verbalized understanding.  No further needs at this time.

## 2019-09-26 NOTE — Progress Notes (Signed)
[place] CSW Progress Notes  Request received from transportation coordinator to assist w scheduling rides for patient to [facility].  Per transport, "said she doesn't want to go to the [place] anymore because she doesn't know and she has been tested. I tried to understand her, but she did not seem coherent."  CSW attempted to reach patient at home and mobile numbers, as well as daughter's number.  No answer, no ability to leave VM.  Desk RN notified.  Edwyna Shell, LCSW Clinical Social Worker Phone:  [phone]

## 2019-09-27 ENCOUNTER — Telehealth: Payer: Self-pay | Admitting: *Deleted

## 2019-09-27 ENCOUNTER — Ambulatory Visit: Admission: RE | Admit: 2019-09-27 | Payer: Medicare Other | Source: Ambulatory Visit

## 2019-09-27 ENCOUNTER — Ambulatory Visit: Payer: Medicare Other

## 2019-09-27 LAB — CULTURE, GROUP A STREP (THRC)

## 2019-09-27 NOTE — Telephone Encounter (Signed)
This RN noted Covid test result obtained 11/17 is reported as " not detected " .  This RN contacted pt and obtained her identified VM- message left informing pt to call this RN to discuss appointments and plan of care.

## 2019-09-29 NOTE — Telephone Encounter (Signed)
I spoke with the patient to let her know that we would still recommend finishing her radiation. She is planning to start tapering her Dexamethasone next week to 2 mg BID. We will continue to make recommendations for this. She requests more pain medication but on the phone sounds quite sedated. I let her know that I would pass the request to medical oncology who gives her these medications. Hopefully she will continue her treatment but initially declined because of her perception that the treatment she had previously is still causing active problems. It is difficult to ascertain these issues given that she is a difficult historian.

## 2019-09-30 ENCOUNTER — Ambulatory Visit: Payer: Medicare Other

## 2019-09-30 ENCOUNTER — Telehealth: Payer: Self-pay | Admitting: Emergency Medicine

## 2019-09-30 ENCOUNTER — Telehealth: Payer: Self-pay | Admitting: *Deleted

## 2019-09-30 ENCOUNTER — Ambulatory Visit (HOSPITAL_BASED_OUTPATIENT_CLINIC_OR_DEPARTMENT_OTHER): Payer: Medicare Other | Admitting: Medical

## 2019-09-30 DIAGNOSIS — C50211 Malignant neoplasm of upper-inner quadrant of right female breast: Secondary | ICD-10-CM

## 2019-09-30 DIAGNOSIS — Z170 Estrogen receptor positive status [ER+]: Secondary | ICD-10-CM

## 2019-09-30 NOTE — Progress Notes (Signed)
The office was contacted today by Mrs. Llera and his family stating that she was becoming increasingly confused.  They had asked for appointment.  Her appointment was made for 1:30.  She was not here by 2:30.  She was called.  She reported that she was in the process of dressing and was getting ready for her appointment.  She still had not arrived by 3:30.  Her husband was called as we were unable to get in touch with Mrs. Nikolai.  A message was left for him.  They called back immediately.  Mrs. Burford was in the background speaking loudly but was not on the phone.  Ultimately her granddaughter picked up the phone and stated that Mrs. Voiles had given them several different times that her appointment was to be today.  Her granddaughter also expressed concern that Mrs. Viney had missed several radiation oncology appointments.  Based on this change in behavior her granddaughter was told that they should either call EMS or take her to the emergency room for evaluation.  This message was relayed to Dr. Jana Hakim.  Sandi Mealy, MHS, PA-C Physician Assistant

## 2019-09-30 NOTE — Telephone Encounter (Signed)
Attempted to call pt again at 1530 for missed appts, no answer & VM full.  PA [name] made aware, called pt and her spouse, no answer, VM left on spouse's phone.  Pt called back right away, transferred call to [place] who spoke with pt about missed appt, then also spoke with pt's family (granddaughter w/patient's permission).  Family reporting extreme confusion in patient who kept telling them different appt times so they were not sure when she was supposed to arrive today.  PA [name] advised family to take pt to ED/call EMS d/t pt's extreme confusion and fatigue, family verbalized understanding and agreement to recommendations.  Appt left on [facility] schedule for today for charting purposes per PA [name].  MD [name]/RN Val made aware.

## 2019-09-30 NOTE — Telephone Encounter (Signed)
This RN spoke with pt this AM per her call - stating " I've been trying to call all morning but my cell phone is dead - I'm not doing so well " " I am confused " " I am out of the fluconazole and that medication Dr Lisbeth Renshaw gave me "  [name] had difficulty completing her sentences and word finding -   She stated she would like to get oxymorphone for her pain instead of the methadone " because that methadone has me looking like the [place] dough boy "  She states she doesn't want to do radiation because " it's making my back break out worse "   Per call this RN informed [name] need to come in for assessment for best outcome - ( note pt has been very confused on dosing of decadron ).  [name] is in agreement to above- appointment made for 130 today for Symptom Management.

## 2019-09-30 NOTE — Telephone Encounter (Signed)
Called pt to confirm if she was still coming to [place] today for [facility] appt.  Pt states she is getting dressed and is on the way, says she will be here by 1430-1500.  PA [name] & MD [name] aware.

## 2019-10-01 ENCOUNTER — Ambulatory Visit: Payer: Medicare Other

## 2019-10-02 ENCOUNTER — Ambulatory Visit: Payer: Medicare Other

## 2019-10-07 ENCOUNTER — Other Ambulatory Visit: Payer: Self-pay | Admitting: Oncology

## 2019-10-07 ENCOUNTER — Ambulatory Visit: Payer: Medicare Other

## 2019-10-07 NOTE — Progress Notes (Signed)
[name]  Telephone:([phone]) [phone] Fax:([phone]) [phone]     ID: [name]   DOB: 03-May-1956  MR#: [id number]  KGU#:[id number]  Patient Care Team: Patient, No Pcp Per as PCP - General (General Practice) Orella Cushman, [name], MD as Consulting Physician (Oncology) [name], MD as Consulting Physician (Radiation Oncology) [name], [name], MD as Consulting Physician (Cardiology) [name] [name], MD as Consulting Physician (Psychiatry) [name], [name] 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   As far as her peripheral disease is concerned, she has been receiving abraxane every 28 days.  She has denied any neuropathy or other significant side effects from this medication.  She also continues on  trastuzumab and pertuzumab, which she receives every 28 days. [name]'s last echocardiogram on 07/17/2019, showed an ejection fraction in the 55% - 60% range.   She was scheduled to see Dr. [name] on 09/16/2019, but she no-showed to the appointment.  She received her first treatment of whole brain radiation on 09/11/2019 under Dr. Lisbeth Renshaw. She was scheduled through 09/24/2019, but she no-showed for several treatments.   The next note in Epic is when she presented to urgent care on 09/24/2019 with cough, low-grade fever, and shortness of breath. Chest x-ray performed at that time showed: mild, diffuse interstitial pulmonary opacity, suggestive of edema in the setting of cardiomegaly or alternately atypical/viral infection; no focal airspace opacity. Covid swab and strep A cultures were negative. She was in contact with my nurse,  Val, via telephone following her urgent care visit stating she did not wish to continue radiation treatments and that her confusion was worsening.   Val made an appointment for [name] with Sandi Mealy, PA on 09/30/2019 that morning, but the patient was confused about the time despite being contacted and saying she was getting ready to leave. Per Van's note, he spoke with [name]'s granddaughter, who expressed concern for the patient, and recommended calling EMS or taking the patient to the emergency room for evaluation.   REVIEW OF SYSTEMS: [name]    BREAST CANCER HISTORY: From the original intake note:  The patient originally presented in May 2003 when she noticed a lump in the upper inner quadrant of her right breast in September 2003.  She sought attention and had a mammogram which showed an obvious carcinoma in the upper inner quadrant of the right breast, approximately 2 cm.  There were some enlarged lymph nodes in the axilla and an FNA done showed those consistent with malignant cells, most likely an invasive ductal carcinoma.  At that point she was unsure of what to do and was referred by Dr. [name] for a discussion of her treatment options.  By biopsy, it was ER/PR negative and HER-2 was 3+.  DNA index was 1.42.  We reiterated that it was most important to have her disease surgically addressed at that point and had recommended lumpectomy and axillary nodes to be addressed with which she followed through and on 04-03-02, Dr. [name] performed a right partial mastectomy and a right axillary lymph node dissection.  Final pathology revealed a 2.4 cm. high grade, Grade III invasive ductal carcinoma with an adjacent .8 cm. also high grade invasive ductal carcinoma which  was felt to represent an intramammary metastasis rather than a second primary.  A smaller mass was just medial to the larger mass.  The smaller mass was associated with high grade DCIS component.  There was no definite  lymphovascular invasion identified.  However, one of fourteen axillary lymph nodes did contain a 1.5 cm. metastatic deposit.  Postoperatively she did very well.  We reiterated the fact that she did need adjuvant chemotherapy, however, she refused and decided that she would pursue radiation.  She received radiation and completed that on 08-14-02. Her subsequent history is as detailed below.   PAST MEDICAL HISTORY: Past Medical History:  Diagnosis Date   Breast cancer ([place])    DVT (deep venous thrombosis) ([place]) 10/07/2011   Fever 02/06/2018   Hypertension    Radiation 11/29/2005-12/29/2005   right supraclavicular area 4147 cGy   Radiation 06/26/02-08/14/02   right breast 5040 cGy, tumor bed boosted to 1260 cGy   Rheumatic fever     PAST SURGICAL HISTORY: Past Surgical History:  Procedure Laterality Date   BREAST SURGERY     MASTECTOMY Bilateral    PORT A CATH REVISION      FAMILY HISTORY Family History  Problem Relation Age of Onset   Pancreatic cancer Mother    Kidney cancer Sister 76   Breast cancer Sister 48   Breast cancer Other 70  The patient's father died in his 04U  from complications of alcohol abuse. The patient's mother died in her 72s from pancreatic cancer. The patient has 6 brothers, 2 sisters. Both her sisters have had breast cancer, both diagnosed after the age of 51. The patient has not had genetic testing so far.   GYNECOLOGIC HISTORY: Menarche age 38; first live birth age 37. She is GXP4. She is not sure when she stopped having periods. She never used hormone replacement.   SOCIAL HISTORY: The patient has worked in the past as a Psychologist, counselling. At home in addition to the patient are her husband [name], originally from [place], who works as a Administrator.  Also living in the home is her daughter [name] (she is studying to be a Marine scientist) and grandson.  The other 2 children are [name], who has a college degree in psychology and works as a Probation officer;  and [name], who lives in [place] and works as a Higher education careers adviser. Son [name] also sometimes stays in the home. In addition the patient has an aide who helps her almost [name].    ADVANCED DIRECTIVES: Not in place   HEALTH MAINTENANCE: Social History   Tobacco Use   Smoking status: Light Tobacco Smoker   Smokeless tobacco: Never Used  Substance Use Topics   Alcohol use: Yes    Comment: Rarely   Drug use: No     Colonoscopy:  Not on file  PAP:  Not on file  Bone density:  Not on file  Lipid panel:  Not on file    Allergies  Allergen Reactions   Pork-Derived Products Other (See Comments)    Pt says she just doesn't eat pork; has no reaction to pork products   Tramadol Nausea Only   Hydrocodone-Acetaminophen Itching and Rash    Current Outpatient Medications  Medication Sig Dispense Refill   ALPRAZolam (XANAX) 0.5 MG tablet Take 1 tablet (0.5 mg total) by mouth 2 (two) times [name] as needed for anxiety. (Patient taking differently: Take 0.5 mg by mouth [name] as needed for anxiety. ) 45 tablet 0   Ascorbic Acid (VITAMIN C)  100 MG tablet Take 100 mg by mouth [name].       cholecalciferol (VITAMIN D) 1000 units tablet Take 1 tablet (1,000 Units total) by mouth [name]. 90 tablet 4   dexamethasone (DECADRON) 4 MG tablet Take 1 tablet (4 mg total) by mouth 4 (four) times [name]. 90 tablet 0   fluconazole (DIFLUCAN) 100 MG tablet Take 1 tablet (100 mg total) by mouth [name]. 7 tablet 0   gabapentin (NEURONTIN) 300 MG capsule Take 1 capsule (300 mg total) by mouth 2 (two) times [name] as needed. (Patient taking differently: Take 300 mg by mouth 2 (two) times [name] as needed (pain). ) 60 capsule 3   hydrOXYzine (ATARAX/VISTARIL) 25 MG tablet TAKE 1 TABLET BY MOUTH THREE TIMES A DAY AS NEEDED (Patient taking differently: Take 25 mg by mouth 3 (three) times [name] as needed for anxiety. ) 270 tablet 1   magic mouthwash SOLN SWISH AND SPIT OR SWALLOW WITH 5MLS BEFORE MEALS AND AT BEDTIME  240 mL 1   methadone (DOLOPHINE) 10 MG tablet Take 10 mg by mouth 3 (three) times [name].   0   Multiple Vitamin (MULTIVITAMIN) capsule Take 1 capsule by mouth [name].       Oxycodone HCl 20 MG TABS Take 20 mg by mouth every 4 (four) hours as needed (pain).      potassium chloride (MICRO-K) 10 MEQ CR capsule Take 2 capsules (20 mEq total) by mouth [name]. 60 capsule 1   prochlorperazine (COMPAZINE) 10 MG tablet Take 1 tablet (10 mg total) by mouth every 6 (six) hours as needed for nausea or vomiting. 30 tablet 0   triamterene-hydrochlorothiazide (DYAZIDE) 37.5-25 MG capsule Take 1 each (1 capsule total) by mouth every morning. 30 capsule 3   zolpidem (AMBIEN) 10 MG tablet Take 1 tablet (10 mg total) by mouth at bedtime as needed for sleep. 30 tablet 0   No current facility-administered medications for this visit.    Facility-Administered Medications Ordered in Other Visits  Medication Dose Route Frequency Provider Last Rate Last Dose   sodium chloride 0.9 % injection 10 mL  10 mL Intravenous PRN Dalana Pfahler, [name], MD   10 mL at 03/04/14 1421   sodium chloride flush (NS) 0.9 % injection 10 mL  10 mL Intracatheter PRN Josyah Achor, [name], MD   10 mL at 07/30/19 1627    OBJECTIVE: Middle-aged African-American woman   There were no vitals filed for this visit. Wt Readings from Last 3 Encounters:  09/13/19 196 lb 11.2 oz (89.2 kg)  09/05/19 202 lb (91.6 kg)  09/04/19 202 lb (91.6 kg)   There is no height or weight on file to calculate BMI.    ECOG FS:1 - Symptomatic but completely ambulatory    Photo 07/04/2017     Photo 08/23/2018    LAB RESULTS:.  Lab Results  Component Value Date   WBC 5.1 09/04/2019   NEUTROABS 3.6 07/30/2019   HGB 11.5 (L) 09/04/2019   HCT 37.5 09/04/2019   MCV 87.2 09/04/2019   PLT 290 09/04/2019      Chemistry      Component Value Date/Time   NA 137 09/04/2019 2325   NA 142 11/09/2017 1214   K 3.7 09/04/2019 2325   K 3.9 11/09/2017  1214   CL 101 09/04/2019 2325   CL 101 03/07/2013 1411   CO2 29 09/04/2019 2325   CO2 32 (H) 11/09/2017 1214   BUN 20 09/04/2019 2325   BUN 19.1 11/09/2017 1214  CREATININE 0.91 09/04/2019 2325   CREATININE 1.05 (H) 07/30/2019 1115   CREATININE 1.0 11/09/2017 1214      Component Value Date/Time   CALCIUM 9.1 09/04/2019 2325   CALCIUM 9.1 11/09/2017 1214   ALKPHOS 132 (H) 07/30/2019 1115   ALKPHOS 120 11/09/2017 1214   AST 27 07/30/2019 1115   AST 30 11/09/2017 1214   ALT 17 07/30/2019 1115   ALT 19 11/09/2017 1214   BILITOT <0.2 (L) 07/30/2019 1115   BILITOT <0.22 11/09/2017 1214       STUDIES: Dg Chest 2 View  Result Date: 09/24/2019 CLINICAL DATA:  Cough, wheezing, shortness of breath EXAM: CHEST - 2 VIEW COMPARISON:  02/06/2018 FINDINGS: Mild cardiomegaly. Left chest port catheter. Mild, diffuse interstitial pulmonary opacity. Disc degenerative disease of the thoracic spine. IMPRESSION: Mild, diffuse interstitial pulmonary opacity, suggestive of edema in the setting of cardiomegaly or alternately atypical/viral infection. No focal airspace opacity. Electronically Signed   By: Eddie Candle [name]   On: 09/24/2019 15:42     ASSESSMENT:  63 y.o. [place] woman with stage IV breast cancer manifested chiefly by loco-regional nodal disease (neck, chest) and skin involvement, without liver, bone, or brain metastases documented   (1) Status post right upper inner quadrant lumpectomy and sentinel lymph node sampling 03/04/2002 for 2 separate foci of invasive ductal carcinoma, mpT2 pN1 or stage IIB, both foci grade 3, both estrogen and progesterone receptor positive, both HercepTest 3+, Mib-1 56%  (2) Reexcision for margins 05/27/2002 showed no residual cancer in the breast.  (3) The patient refused adjuvant systemic therapy.  (4) Adjuvant radiation treatment completed 08/14/2002.  (5) recurrence in the right breast in 02/2004 showing a morphologically different tumor, again  grade 3, again estrogen and progesterone receptor negative, with an MIB-1 of 14% and Herceptest 3+.  (5) Between 03/2004 and 07/2004 she received dose dense Doxorubicin/Cyclophosphamide x 4 given with trastuzumab, followed by weekly Docetaxel x 8, again given with trastuzumab.  (6) Right mastectomy 07/13/2004 showed scattered microscopic foci of residual disease over an area  greater than 5 cm. Margins were negative.  (7) Postoperative Docetaxel continued until 09/2004.  (8) Trastuzumab (Herceptin) given 08/2004 through 01/2012 with some brief interruptions.  RECURRENT/ STAGE IV DISEASE December 2006 (9) Isolated right cervical nodal recurrence 10/2005, treated with radiation to the right supraclavicular area (total 41.5 gray) completed 12/29/2005.  (10) Navelbine given together with Herceptin  11/2005 through 03/2006.  (9) Left mastectomy 02/13/2006 for ductal carcinoma in situ, grade 2, estrogen and progesterone receptor negative, with negative margins; 0 of 3 lymph nodes involved  (10) treated with Lapatinib and Capecitabine before 10/2009, for an unclear duration and with unclear results (cannot locate data on chart review).  (11) Status post right supraclavicular lymph node biopsy 09/2010 again positive for an invasive ductal carcinoma, estrogen and progesterone receptor negative, HER-2 positive by CISH with a ratio 4.25.  (12) Navelbine given together with Herceptin between 05/2011 and 11/2011.  (13) Carboplatin/ Gemcitabine/ Herceptin given for 2 cycles, in 12/2011 and 01/2012.  (14) TDM-1 ([place]) started 02/2012. Last dose 10/02/2013 after which the patient discontinued treatment at her own discretion. Echo on December 2014 showed a well preserved ejection fraction.  (15) Deep vein thrombosis of the right upper extremity documented 04/20/2011.  She completed anticoagulant therapy with Coumadin on 03/25/2013.  (16) Chronic right upper extremity lymphedema, not responsive to  aggressive PT  (a) biopsy of denuded area 04/23/2015 read as dermatofibroma (discordant)  (b) deeper cuts of 04/23/2015 biopsy  suggest angiosarcoma  (17) Right chest port-a-cath removal due to infection on 01/28/2013. Left chest Port-A-Cath placed on 04/08/2013; being flushed every 6 weeks  RIGHT UPPER EXTREMITY ANGIOSARCOMA VS BREAST CANCER: August 2016 (18) treated at [facility] of [place] August 2016 with paclitaxel, trastuzumab and pertuzumab 1 cycle, afterwards referred to hospice  (19) under the care of hospice [name] August 2016 to 05/08/2016, when the patient opted for a second try at chemotherapy  (20) started low-dose Abraxane, trastuzumab and pertuzumab 06/07/2016, to be repeated every 4 weeks.   (a) pretreatment echocardiogram 06/06/2016 showed a 60-65% ejection fraction  (b) significant compliance problems compromise response to treatment  (c)  echocardiogram 02/20/17 LVEF 55-60%  (d) Abraxane discontinued 07/04/2017 because of possible neuropathy symptoms  (e) Abraxane resumed 09/27/2017  (f) echocardiogram 07/20/2017 showed an ejection fraction of 60-65%  (g) echo 01/22/2018 showed an ejection fraction of 50-55%  (h) CT scan of the chest 05/03/2018 shows no disease involving the lungs liver or lymph nodes, with stable subcutaneous masses as previously noted  (I) Abraxane discontinued August 2019 because of concerns regarding neuropathy-- gemcitabine substituted  (j) echocardiogram 08/20/2018 shows an ejection fraction in the 55-60% range (done every 6 months)  (k) gemcitabine discontinued because of multiple symptoms, Abraxane restarted 09/11/2018, given once every 4 weeks  (l) echocardiogram 07/17/2019 shows a well-preserved ejection fraction at 55-60%  (21) Chronic pain secondary to known metastatic disease  (a) patient signed pain contract 10/19/2016  (b) as of 11/16/2016 receives her pain medications from Dr [name]  (c)  Dr [name] no longer able to  prescribe as of February 2019  (d) all pain medications now through [facility] (as of April 2019)  (22) left breast and left axillary adenopathy noted clinically  (a) left axillary lymph node biopsy on 07/24/2019 benign  (23) frontal and temporal lobe metastases noted on brain MRI 09/04/2019  (a) adjuvant radiation ongoing.  (24) restaging PET scan 08/20/2019 shows hypermetabolic adenopathy, subcutaneous malignancy as clinically appreciated, but no liver or lung parenchymal lesions   PLAN: [place] is now nearly 14 years out from definitive diagnosis of metastatic breast cancer.  Her peripheral disease is moderately well controlled on her current therapy and she maintains a fair functional status.  Accordingly the longer-term plan as far as out of concern is to continue paclitaxel and trastuzumab/pertuzumab every 28 days as before.  She now has multiple brain lesions.  She is receiving radiation to the CNS and we are holding the abraxane for now.  I have not been able to clarify advanced directives with her in the past.  We will try again when she returns to see me but note she did not show for visit today 12/01.2020  I have asked to have her recheduled for 10/09/2019 at 3:30 PM, just before her radiation treatments     Ericson Nafziger, [name], MD  10/09/19 8:12 AM Medical Oncology and Hematology [facility] [place], [place] [address] Tel. [phone]    Fax. [phone]   I, Wilburn Mylar, am acting as scribe for Dr. [name]. Jacquelina Hewins.  I, Lurline Del MD, have reviewed the above documentation for accuracy and completeness, and I agree with the above.

## 2019-10-08 ENCOUNTER — Other Ambulatory Visit: Payer: Medicare Other

## 2019-10-08 ENCOUNTER — Ambulatory Visit (HOSPITAL_BASED_OUTPATIENT_CLINIC_OR_DEPARTMENT_OTHER): Payer: Medicare Other | Admitting: Oncology

## 2019-10-08 ENCOUNTER — Ambulatory Visit: Payer: Medicare Other

## 2019-10-08 ENCOUNTER — Telehealth: Payer: Self-pay | Admitting: Oncology

## 2019-10-08 DIAGNOSIS — Z170 Estrogen receptor positive status [ER+]: Secondary | ICD-10-CM

## 2019-10-08 DIAGNOSIS — C7931 Secondary malignant neoplasm of brain: Secondary | ICD-10-CM

## 2019-10-08 DIAGNOSIS — C4911 Malignant neoplasm of connective and soft tissue of right upper limb, including shoulder: Secondary | ICD-10-CM

## 2019-10-08 DIAGNOSIS — C50211 Malignant neoplasm of upper-inner quadrant of right female breast: Secondary | ICD-10-CM

## 2019-10-08 NOTE — Progress Notes (Signed)
  Radiation Oncology         [phone] ________________________________  Name: [name] MRN: [id number]  Date: 09/09/2019  DOB: Mar 31, 1956    Simulation and treatment planning note  DIAGNOSIS:     ICD-10-CM   1. Metastasis to brain [facility])  C79.31      The patient presented for simulation for the patient's upcoming course of whole brain radiation treatment. The patient was placed in a supine position and a customized thermoplastic head cast was constructed to aid in patient immobilization during the treatment. This complex treatment device will be used on a daily basis. In this fashion a CT scan was obtained through the head and neck region and isocenter was placed near midline within the brain.  The patient will be planned to receive a course of whole brain radiation treatment to a dose of 30 gray in 10 fractions at 3 gray per fraction. To accomplish this, 2 customized blocks have been designed which corresponds to left and right whole brain radiation fields. These 2 complex treatment devices will be used on a daily basis during the course of radiation. A complex isodose plan is requested to insure that the target area is adequately covered in to facilitate optimization of the treatment plan. A forward planning technique will also be evaluated to determine if this approach significantly improves the plan.   ________________________________   Jodelle Gross, MD, PhD

## 2019-10-08 NOTE — Telephone Encounter (Signed)
Returned patient's phone call regarding rescheduling an appointment, left a voicemail. 

## 2019-10-09 ENCOUNTER — Inpatient Hospital Stay: Payer: Medicare Other | Attending: Oncology | Admitting: Oncology

## 2019-10-09 ENCOUNTER — Ambulatory Visit
Admission: RE | Admit: 2019-10-09 | Discharge: 2019-10-09 | Disposition: A | Discharge status: Home / Self Care | Payer: Medicare Other | Source: Ambulatory Visit | Attending: Radiation Oncology | Admitting: Radiation Oncology

## 2019-10-09 ENCOUNTER — Encounter: Payer: Self-pay | Admitting: Radiation Oncology

## 2019-10-09 ENCOUNTER — Ambulatory Visit: Payer: Medicare Other

## 2019-10-09 ENCOUNTER — Other Ambulatory Visit: Payer: Self-pay

## 2019-10-09 ENCOUNTER — Telehealth: Payer: Self-pay | Admitting: Oncology

## 2019-10-09 ENCOUNTER — Other Ambulatory Visit: Payer: Self-pay | Admitting: Radiation Oncology

## 2019-10-09 VITALS — BP 97/81 | HR 92 | Temp 98.3°F | Resp 18 | Ht 65.0 in | Wt 200.9 lb

## 2019-10-09 DIAGNOSIS — I10 Essential (primary) hypertension: Secondary | ICD-10-CM | POA: Insufficient documentation

## 2019-10-09 DIAGNOSIS — Z170 Estrogen receptor positive status [ER+]: Secondary | ICD-10-CM

## 2019-10-09 DIAGNOSIS — Z5112 Encounter for antineoplastic immunotherapy: Secondary | ICD-10-CM | POA: Insufficient documentation

## 2019-10-09 DIAGNOSIS — F329 Major depressive disorder, single episode, unspecified: Secondary | ICD-10-CM | POA: Diagnosis not present

## 2019-10-09 DIAGNOSIS — Z79899 Other long term (current) drug therapy: Secondary | ICD-10-CM | POA: Insufficient documentation

## 2019-10-09 DIAGNOSIS — R05 Cough: Secondary | ICD-10-CM | POA: Diagnosis not present

## 2019-10-09 DIAGNOSIS — Z803 Family history of malignant neoplasm of breast: Secondary | ICD-10-CM | POA: Insufficient documentation

## 2019-10-09 DIAGNOSIS — Z923 Personal history of irradiation: Secondary | ICD-10-CM | POA: Insufficient documentation

## 2019-10-09 DIAGNOSIS — Z5111 Encounter for antineoplastic chemotherapy: Secondary | ICD-10-CM | POA: Diagnosis present

## 2019-10-09 DIAGNOSIS — C4911 Malignant neoplasm of connective and soft tissue of right upper limb, including shoulder: Secondary | ICD-10-CM | POA: Diagnosis not present

## 2019-10-09 DIAGNOSIS — C50211 Malignant neoplasm of upper-inner quadrant of right female breast: Secondary | ICD-10-CM | POA: Diagnosis not present

## 2019-10-09 DIAGNOSIS — F17210 Nicotine dependence, cigarettes, uncomplicated: Secondary | ICD-10-CM | POA: Diagnosis not present

## 2019-10-09 DIAGNOSIS — G8929 Other chronic pain: Secondary | ICD-10-CM | POA: Insufficient documentation

## 2019-10-09 DIAGNOSIS — Z86718 Personal history of other venous thrombosis and embolism: Secondary | ICD-10-CM | POA: Insufficient documentation

## 2019-10-09 DIAGNOSIS — Z800 Family history of malignant neoplasm of digestive organs: Secondary | ICD-10-CM | POA: Diagnosis not present

## 2019-10-09 DIAGNOSIS — C7931 Secondary malignant neoplasm of brain: Secondary | ICD-10-CM | POA: Insufficient documentation

## 2019-10-09 DIAGNOSIS — R404 Transient alteration of awareness: Secondary | ICD-10-CM | POA: Insufficient documentation

## 2019-10-09 DIAGNOSIS — Z9013 Acquired absence of bilateral breasts and nipples: Secondary | ICD-10-CM | POA: Insufficient documentation

## 2019-10-09 DIAGNOSIS — I890 Lymphedema, not elsewhere classified: Secondary | ICD-10-CM | POA: Insufficient documentation

## 2019-10-09 DIAGNOSIS — F419 Anxiety disorder, unspecified: Secondary | ICD-10-CM | POA: Insufficient documentation

## 2019-10-09 MED ORDER — DEXAMETHASONE 4 MG PO TABS: 4.0000 mg | ORAL_TABLET | Freq: Two times a day (BID) | ORAL | 0 refills | Status: DC

## 2019-10-09 MED ORDER — HYDROXYZINE HCL 25 MG PO TABS: 25.0000 mg | ORAL_TABLET | Freq: Three times a day (TID) | ORAL | 0 refills | Status: DC | PRN

## 2019-10-09 NOTE — Progress Notes (Addendum)
The patient has a history of metastatic breast cancer with recently noted bulky brain metastases. She was started on steroids for cerebral edema and started whole brain radiotherapy. She completed 5/10 treatments as of 09/18/2019 but has no showed or cancelled each of her treatments up until this point. We have tried on multiple occasions reaching out including our treatment staff but have yet to connect with her in the last week. I spoke with Dr. Jana Hakim as well and he is hoping she will come today to discuss her situation further and possibly for her infusion.     Carola Rhine, PAC

## 2019-10-09 NOTE — Telephone Encounter (Signed)
Confirmed today's f/u with patient. Also rescheduled 12/1 appointments for next week per 12/1 schedule message. Patient will get schedule at today's visit.
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
## 2019-10-09 NOTE — Progress Notes (Signed)
The patient did come for XRT today and was encouraged by staff to continue to complete her treatments. A new rx was needed for her dexamethasone and she will continue taking 4 mg BID since she has delayed her treatment. At the completion she will need to taper this and I will ask nursing to give her instructions.     Carola Rhine, PAC

## 2019-10-10 ENCOUNTER — Ambulatory Visit: Payer: Medicare Other

## 2019-10-11 ENCOUNTER — Other Ambulatory Visit: Payer: Self-pay | Admitting: Radiation Therapy

## 2019-10-11 ENCOUNTER — Telehealth: Payer: Self-pay | Admitting: *Deleted

## 2019-10-11 ENCOUNTER — Ambulatory Visit (HOSPITAL_COMMUNITY)
Admission: RE | Admit: 2019-10-11 | Discharge: 2019-10-11 | Disposition: A | Discharge status: Home / Self Care | Payer: Medicare Other | Source: Ambulatory Visit | Attending: Medical | Admitting: Medical

## 2019-10-11 ENCOUNTER — Ambulatory Visit: Payer: Medicare Other

## 2019-10-11 ENCOUNTER — Inpatient Hospital Stay: Payer: Medicare Other

## 2019-10-11 ENCOUNTER — Inpatient Hospital Stay (HOSPITAL_BASED_OUTPATIENT_CLINIC_OR_DEPARTMENT_OTHER): Payer: Medicare Other | Admitting: Medical

## 2019-10-11 ENCOUNTER — Other Ambulatory Visit: Payer: Self-pay

## 2019-10-11 ENCOUNTER — Other Ambulatory Visit: Payer: Self-pay | Admitting: Oncology

## 2019-10-11 ENCOUNTER — Ambulatory Visit
Admission: RE | Admit: 2019-10-11 | Discharge: 2019-10-11 | Disposition: A | Discharge status: Home / Self Care | Payer: Medicare Other | Source: Ambulatory Visit | Attending: Radiation Oncology | Admitting: Radiation Oncology

## 2019-10-11 VITALS — BP 145/81 | HR 69 | Temp 98.9°F | Resp 18 | Ht 65.0 in | Wt 203.2 lb

## 2019-10-11 DIAGNOSIS — F419 Anxiety disorder, unspecified: Secondary | ICD-10-CM

## 2019-10-11 DIAGNOSIS — C50211 Malignant neoplasm of upper-inner quadrant of right female breast: Secondary | ICD-10-CM | POA: Diagnosis not present

## 2019-10-11 DIAGNOSIS — Z95828 Presence of other vascular implants and grafts: Secondary | ICD-10-CM

## 2019-10-11 DIAGNOSIS — R059 Cough, unspecified: Secondary | ICD-10-CM

## 2019-10-11 DIAGNOSIS — C7931 Secondary malignant neoplasm of brain: Secondary | ICD-10-CM

## 2019-10-11 DIAGNOSIS — G629 Polyneuropathy, unspecified: Secondary | ICD-10-CM

## 2019-10-11 DIAGNOSIS — F329 Major depressive disorder, single episode, unspecified: Secondary | ICD-10-CM

## 2019-10-11 DIAGNOSIS — Z5112 Encounter for antineoplastic immunotherapy: Secondary | ICD-10-CM | POA: Diagnosis not present

## 2019-10-11 DIAGNOSIS — R404 Transient alteration of awareness: Secondary | ICD-10-CM

## 2019-10-11 DIAGNOSIS — Z170 Estrogen receptor positive status [ER+]: Secondary | ICD-10-CM

## 2019-10-11 DIAGNOSIS — R05 Cough: Secondary | ICD-10-CM

## 2019-10-11 DIAGNOSIS — F32A Depression, unspecified: Secondary | ICD-10-CM

## 2019-10-11 LAB — CMP (CANCER CENTER ONLY)
ALT: 22 U/L (ref 0–44)
AST: 27 U/L (ref 15–41)
Albumin: 3.3 g/dL — ABNORMAL LOW (ref 3.5–5.0)
Alkaline Phosphatase: 105 U/L (ref 38–126)
Anion gap: 9 (ref 5–15)
BUN: 17 mg/dL (ref 8–23)
CO2: 30 mmol/L (ref 22–32)
Calcium: 9.1 mg/dL (ref 8.9–10.3)
Chloride: 101 mmol/L (ref 98–111)
Creatinine: 0.84 mg/dL (ref 0.44–1.00)
GFR, Est AFR Am: >60 mL/min (ref >60)
GFR, Estimated: >60 mL/min (ref >60)
Glucose, Bld: 100 mg/dL — ABNORMAL HIGH (ref 70–99)
Potassium: 4.4 mmol/L (ref 3.5–5.1)
Sodium: 140 mmol/L (ref 135–145)
Total Bilirubin: <0.2 mg/dL — ABNORMAL LOW (ref 0.3–1.2)
Total Protein: 7.2 g/dL (ref 6.5–8.1)

## 2019-10-11 LAB — CBC WITH DIFFERENTIAL (CANCER CENTER ONLY)
Abs Immature Granulocytes: 0.06 10*3/uL (ref 0.00–0.07)
Basophils Absolute: 0 10*3/uL (ref 0.0–0.1)
Basophils Relative: 0 %
Eosinophils Absolute: 0.1 10*3/uL (ref 0.0–0.5)
Eosinophils Relative: 1 %
HCT: 34.2 % — ABNORMAL LOW (ref 36.0–46.0)
Hemoglobin: 10.5 g/dL — ABNORMAL LOW (ref 12.0–15.0)
Immature Granulocytes: 1 %
Lymphocytes Relative: 15 %
Lymphs Abs: 1.1 10*3/uL (ref 0.7–4.0)
MCH: 26.3 pg (ref 26.0–34.0)
MCHC: 30.7 g/dL (ref 30.0–36.0)
MCV: 85.7 fL (ref 80.0–100.0)
Monocytes Absolute: 0.6 10*3/uL (ref 0.1–1.0)
Monocytes Relative: 8 %
Neutro Abs: 5.5 10*3/uL (ref 1.7–7.7)
Neutrophils Relative %: 75 %
Platelet Count: 313 10*3/uL (ref 150–400)
RBC: 3.99 MIL/uL (ref 3.87–5.11)
RDW: 16.6 % — ABNORMAL HIGH (ref 11.5–15.5)
WBC Count: 7.4 10*3/uL (ref 4.0–10.5)
nRBC: 0 % (ref 0.0–0.2)

## 2019-10-11 MED ORDER — AMOXICILLIN-POT CLAVULANATE 875-125 MG PO TABS: 1.0000 | ORAL_TABLET | Freq: Two times a day (BID) | ORAL | 0 refills | Status: DC

## 2019-10-11 MED ORDER — SERTRALINE HCL 100 MG PO TABS: 100.0000 mg | ORAL_TABLET | Freq: Every day | ORAL | 5 refills | Status: DC

## 2019-10-11 MED ORDER — SODIUM CHLORIDE 0.9% FLUSH
10.0000 mL | Freq: Once | INTRAVENOUS | Status: AC
  Administered 2019-10-11: 10 mL
  Filled 2019-10-11: qty 10

## 2019-10-11 MED ORDER — HEPARIN SOD (PORK) LOCK FLUSH 100 UNIT/ML IV SOLN
500.0000 [IU] | Freq: Once | INTRAVENOUS | Status: AC | PRN
  Administered 2019-10-11: 500 [IU] via INTRAVENOUS
  Filled 2019-10-11: qty 5

## 2019-10-11 NOTE — Progress Notes (Signed)
PA [name] states he will contact patient with results of CXR/labs from today.  Urine sample order cancelled per PA [name], lab aware.

## 2019-10-11 NOTE — Telephone Encounter (Signed)
This RN received VM's from both of [name]'s daughter-  Stating concerns of noted increased " confusion and Mom just not acting right " .  Note [name] has been independent in her care and medication administration - as well as she is telling them " I don't need to do anymore radiation - she told them she received a treatment - " chemo " the other day ( which she did not ).   This RN discussed with [name]- pt's situation- and need for family member to be present in person for assessment and plan.  This RN also asked for them to bring all pt's medications so review of administration can be assessed.  Per review with [facility]- time obtained for appointment with verbalized understanding with [name] and Daleen Snook.

## 2019-10-11 NOTE — Patient Instructions (Signed)
COVID-19: How to Protect Yourself and Others Know how it spreads  There is currently no vaccine to prevent coronavirus disease 2019 (COVID-19).  The best way to prevent illness is to avoid being exposed to this virus.  The virus is thought to spread mainly from person-to-person. ? Between people who are in close contact with one another (within about 6 feet). ? Through respiratory droplets produced when an infected person coughs, sneezes or talks. ? These droplets can land in the mouths or noses of people who are nearby or possibly be inhaled into the lungs. ? Some recent studies have suggested that COVID-19 may be spread by people who are not showing symptoms. Everyone should Clean your hands often  Wash your hands often with soap and water for at least 20 seconds especially after you have been in a public place, or after blowing your nose, coughing, or sneezing.  If soap and water are not readily available, use a hand sanitizer that contains at least 60% alcohol. Cover all surfaces of your hands and rub them together until they feel dry.  Avoid touching your eyes, nose, and mouth with unwashed hands. Avoid close contact  Stay home if you are sick.  Avoid close contact with people who are sick.  Put distance between yourself and other people. ? Remember that some people without symptoms may be able to spread virus. ? This is especially important for people who are at higher risk of getting very sick.www.cdc.gov/coronavirus/2019-ncov/need-extra-precautions/people-at-higher-risk.html Cover your mouth and nose with a cloth face cover when around others  You could spread COVID-19 to others even if you do not feel sick.  Everyone should wear a cloth face cover when they have to go out in public, for example to the grocery store or to pick up other necessities. ? Cloth face coverings should not be placed on young children under age 2, anyone who has trouble breathing, or is unconscious,  incapacitated or otherwise unable to remove the mask without assistance.  The cloth face cover is meant to protect other people in case you are infected.  Do NOT use a facemask meant for a healthcare worker.  Continue to keep about 6 feet between yourself and others. The cloth face cover is not a substitute for social distancing. Cover coughs and sneezes  If you are in a private setting and do not have on your cloth face covering, remember to always cover your mouth and nose with a tissue when you cough or sneeze or use the inside of your elbow.  Throw used tissues in the trash.  Immediately wash your hands with soap and water for at least 20 seconds. If soap and water are not readily available, clean your hands with a hand sanitizer that contains at least 60% alcohol. Clean and disinfect  Clean AND disinfect frequently touched surfaces daily. This includes tables, doorknobs, light switches, countertops, handles, desks, phones, keyboards, toilets, faucets, and sinks. www.cdc.gov/coronavirus/2019-ncov/prevent-getting-sick/disinfecting-your-home.html  If surfaces are dirty, clean them: Use detergent or soap and water prior to disinfection.  Then, use a household disinfectant. You can see a list of EPA-registered household disinfectants here. cdc.gov/coronavirus 03/12/2019 This information is not intended to replace advice given to you by your health care provider. Make sure you discuss any questions you have with your health care provider. Document Released: 02/19/2019 Document Revised: 03/20/2019 Document Reviewed: 02/19/2019 Elsevier Patient Education  2020 [employer]  

## 2019-10-14 ENCOUNTER — Ambulatory Visit
Admission: RE | Admit: 2019-10-14 | Discharge: 2019-10-14 | Disposition: A | Discharge status: Home / Self Care | Payer: Medicare Other | Source: Ambulatory Visit | Attending: Radiation Oncology | Admitting: Radiation Oncology

## 2019-10-14 ENCOUNTER — Telehealth: Payer: Self-pay | Admitting: Emergency Medicine

## 2019-10-14 ENCOUNTER — Other Ambulatory Visit: Payer: Self-pay

## 2019-10-14 ENCOUNTER — Ambulatory Visit: Payer: Medicare Other

## 2019-10-14 DIAGNOSIS — C7931 Secondary malignant neoplasm of brain: Secondary | ICD-10-CM | POA: Diagnosis not present

## 2019-10-14 NOTE — Progress Notes (Signed)
[facility]pt[facility] Progress Note   [name] [id number] 1956/03/21 63 y.o.  [name] is managed by Dr. Jana Hakim  Actively treated with chemotherapy/immunotherapy/hormonal therapy: Whole brain radiation  Next scheduled appointment with provider: 10/17/2019  Assessment: Plan:    Port catheter in place - Plan: heparin lock flush 100 unit/mL, sodium chloride flush (NS) 0.9 % injection 10 mL  Malignant neoplasm of upper-inner quadrant of right breast in female, estrogen receptor positive ([place]) - Plan: heparin lock flush 100 unit/mL, sodium chloride flush (NS) 0.9 % injection 10 mL  Cough - Plan: DG Chest 2 View, amoxicillin-clavulanate (AUGMENTIN) 875-125 MG tablet, DISCONTINUED: amoxicillin-clavulanate (AUGMENTIN) 875-125 MG tablet  Anxiety and depression  Metastasis to brain ([place])  Transient alteration of awareness   ER positive malignant neoplasm of the right breast with brain metastasis: The patient continues on whole brain radiation.  She is scheduled to see Dr. Jana Hakim in follow-up on 10/17/2019 for discussion of ongoing treatment options.  Productive cough: Patient was referred for a chest x-ray and was placed on Augmentin 875-125 p.o. twice daily x7 days.  Anxiety and depression: Patient was begun on Zoloft 100 mg p.o. once daily.  Transient alterations of awareness: The patient's medications were reviewed with she and her daughter.  A referral has been made for palliative care with the specific request for assistance in medication management.  I spoke with the patient's daughter Davy Pique) and told her that I did not believe that her mother could manage her medications independently and that she would need assistance with this.  She expressed understanding and agreement with this plan.  Please see After Visit Summary for patient specific instructions.  Future Appointments  Date Time Provider [place]  10/15/2019 12:00 PM [place]  [facility] None  10/15/2019  3:45 PM [facility] LINAC 1 [facility] None  10/16/2019  2:00 PM [facility] LINAC 1 [facility] None  10/17/2019 11:15 AM [facility] LAB 6 [facility] None  10/17/2019 11:30 AM [facility] [place] FLUSH [facility] None  10/17/2019 12:00 PM [name], [name], MD [facility] None  10/17/2019 12:30 PM [facility] INFUSION [facility] None    Orders Placed This Encounter  Procedures  . DG Chest 2 View       Subjective:   Patient ID:  [name] is a 63 y.o. (DOB Mar 25, 1956) female.  Chief Complaint:  Chief Complaint  Patient presents with  . Altered Mental Status    HPI [name]  Is a 62 y.o. female with a diagnosis of an ER positive malignant neoplasm of the right breast with metastatic disease to the brain.  She continues to be followed by Dr. Jana Hakim and is currently receiving whole brain radiation.  She has missed several radiation appointments.  She presents to the clinic today with her daughter from [place].  The patient has been having periods of confusion.  She brought her medications to the clinic today.  These were reviewed with she and her daughter with 4 bottles of medication filled out due to the fact that they were empty.  The patient also reports that she has had a productive cough with yellowish-green sputum.  She reports anxiety and depression.  She is agreeable to begin Zoloft.  She is also agreeable to transition from home health to palliative care with a request made for assistance and medication management.  Medications: I have reviewed the patient's current medications.  Allergies:  Allergies  Allergen Reactions  . Tramadol Nausea Only  . Hydrocodone-Acetaminophen Itching and Rash  . Pork-Derived  Products Other (See Comments)    Pt says she just doesn't eat pork; has no reaction to pork products    Past Medical History:  Diagnosis Date  . Breast cancer ([place])   . DVT (deep venous thrombosis) ([place]) 10/07/2011  . Fever  02/06/2018  . Hypertension   . Radiation 11/29/2005-12/29/2005   right supraclavicular area 4147 cGy  . Radiation 06/26/02-08/14/02   right breast 5040 cGy, tumor bed boosted to 1260 cGy  . Rheumatic fever     Past Surgical History:  Procedure Laterality Date  . BREAST SURGERY    . MASTECTOMY Bilateral   . PORT A CATH REVISION      Family History  Problem Relation Age of Onset  . Pancreatic cancer Mother   . Kidney cancer Sister 6  . Breast cancer Sister 20  . Breast cancer Other 57    Social History   Socioeconomic History  . Marital status: Single    Spouse name: Not on file  . Number of children: Not on file  . Years of education: Not on file  . Highest education level: Not on file  Occupational History  . Not on file  Social Needs  . Financial resource strain: Not on file  . Food insecurity    Worry: Not on file    Inability: Not on file  . Transportation needs    Medical: Yes    Non-medical: Yes  Tobacco Use  . Smoking status: Light Tobacco Smoker  . Smokeless tobacco: Never Used  Substance and Sexual Activity  . Alcohol use: Yes    Comment: Rarely  . Drug use: No  . Sexual activity: Yes    Birth control/protection: Post-menopausal  Lifestyle  . Physical activity    Days per week: Not on file    Minutes per session: Not on file  . Stress: Not on file  Relationships  . Social Herbalist on phone: Not on file    Gets together: Not on file    Attends religious service: Not on file    Active member of club or organization: Not on file    Attends meetings of clubs or organizations: Not on file    Relationship status: Not on file  . Intimate partner violence    Fear of current or ex partner: Not on file    Emotionally abused: Not on file    Physically abused: Not on file    Forced sexual activity: Not on file  Other Topics Concern  . Not on file  Social History Narrative  . Not on file    Past Medical History, Surgical history, Social  history, and Family history were reviewed and updated as appropriate.   Please see review of systems for further details on the patient's review from today.   Review of Systems:  Review of Systems  Constitutional: Negative for chills, diaphoresis and fever.  HENT: Negative for trouble swallowing and voice change.   Respiratory: Positive for cough. Negative for choking, chest tightness, shortness of breath, wheezing and stridor.   Cardiovascular: Negative for chest pain and palpitations.  Gastrointestinal: Negative for abdominal pain, constipation, diarrhea, nausea and vomiting.  Musculoskeletal: Negative for back pain and myalgias.  Neurological: Negative for dizziness, light-headedness and headaches.  Psychiatric/Behavioral: Positive for confusion.    Objective:   Physical Exam:  BP (!) 145/81 (BP Location: Left Arm, Patient Position: Sitting) Comment: Quenesha Douglass PA was notified  Pulse 69   Temp 98.9 F (37.2 C) (  Temporal)   Resp 18   Ht 5\' 5"  (1.651 m)   Wt 203 lb 3.2 oz (92.2 kg)   SpO2 98%   BMI 33.81 kg/m  ECOG: 1  Physical Exam Constitutional:      General: She is not in acute distress.    Appearance: She is not diaphoretic.  HENT:     Head: Normocephalic and atraumatic.  Eyes:     General: No scleral icterus.       Right eye: No discharge.        Left eye: No discharge.  Cardiovascular:     Rate and Rhythm: Normal rate and regular rhythm.     Heart sounds: Normal heart sounds. No murmur. No friction rub. No gallop.      Comments: No presacral edema Pulmonary:     Effort: Pulmonary effort is normal. No respiratory distress.     Breath sounds: Examination of the right-lower field reveals decreased breath sounds. Examination of the left-lower field reveals decreased breath sounds. Decreased breath sounds present. No wheezing or rales.  Musculoskeletal:     Right lower leg: No edema.     Left lower leg: No edema.  Skin:    General: Skin is warm and dry.      Findings: No erythema or rash.  Neurological:     Mental Status: She is alert.     Coordination: Coordination normal.     Gait: Gait normal.     Lab Review:     Component Value Date/Time   NA 140 10/11/2019 1305   NA 142 11/09/2017 1214   K 4.4 10/11/2019 1305   K 3.9 11/09/2017 1214   CL 101 10/11/2019 1305   CL 101 03/07/2013 1411   CO2 30 10/11/2019 1305   CO2 32 (H) 11/09/2017 1214   GLUCOSE 100 (H) 10/11/2019 1305   GLUCOSE 110 11/09/2017 1214   GLUCOSE 92 03/07/2013 1411   BUN 17 10/11/2019 1305   BUN 19.1 11/09/2017 1214   CREATININE 0.84 10/11/2019 1305   CREATININE 1.0 11/09/2017 1214   CALCIUM 9.1 10/11/2019 1305   CALCIUM 9.1 11/09/2017 1214   PROT 7.2 10/11/2019 1305   PROT 7.6 11/09/2017 1214   ALBUMIN 3.3 (L) 10/11/2019 1305   ALBUMIN 3.5 11/09/2017 1214   AST 27 10/11/2019 1305   AST 30 11/09/2017 1214   ALT 22 10/11/2019 1305   ALT 19 11/09/2017 1214   ALKPHOS 105 10/11/2019 1305   ALKPHOS 120 11/09/2017 1214   BILITOT <0.2 (L) 10/11/2019 1305   BILITOT <0.22 11/09/2017 1214   GFRNONAA >60 10/11/2019 1305   GFRAA >60 10/11/2019 1305       Component Value Date/Time   WBC 7.4 10/11/2019 1305   WBC 5.1 09/04/2019 2325   RBC 3.99 10/11/2019 1305   HGB 10.5 (L) 10/11/2019 1305   HGB 11.3 (L) 11/09/2017 1214   HCT 34.2 (L) 10/11/2019 1305   HCT 35.3 11/09/2017 1214   PLT 313 10/11/2019 1305   PLT 231 11/09/2017 1214   MCV 85.7 10/11/2019 1305   MCV 83.9 11/09/2017 1214   MCH 26.3 10/11/2019 1305   MCHC 30.7 10/11/2019 1305   RDW 16.6 (H) 10/11/2019 1305   RDW 16.9 (H) 11/09/2017 1214   LYMPHSABS 1.1 10/11/2019 1305   LYMPHSABS 1.5 11/09/2017 1214   MONOABS 0.6 10/11/2019 1305   MONOABS 0.7 11/09/2017 1214   EOSABS 0.1 10/11/2019 1305   EOSABS 0.3 11/09/2017 1214   BASOSABS 0.0 10/11/2019 1305   BASOSABS  0.1 11/09/2017 1214   -------------------------------  Imaging from last 24 hours (if applicable):  Radiology interpretation: Dg  Chest 2 View  Result Date: 10/11/2019 CLINICAL DATA:  Productive cough.  History of breast cancer EXAM: CHEST - 2 VIEW COMPARISON:  09/24/2019 FINDINGS: Heart size and vascularity normal. Lungs are well aerated and clear. No focal area of infiltrate or effusion. Port-A-Cath tip at the cavoatrial junction unchanged. Thoracic degenerative change and spurring. IMPRESSION: No active cardiopulmonary disease. Electronically Signed   By: Franchot Gallo [name]   On: 10/11/2019 15:55   Dg Chest 2 View  Result Date: 09/24/2019 CLINICAL DATA:  Cough, wheezing, shortness of breath EXAM: CHEST - 2 VIEW COMPARISON:  02/06/2018 FINDINGS: Mild cardiomegaly. Left chest port catheter. Mild, diffuse interstitial pulmonary opacity. Disc degenerative disease of the thoracic spine. IMPRESSION: Mild, diffuse interstitial pulmonary opacity, suggestive of edema in the setting of cardiomegaly or alternately atypical/viral infection. No focal airspace opacity. Electronically Signed   By: Eddie Candle [name]   On: 09/24/2019 15:42

## 2019-10-14 NOTE — Telephone Encounter (Signed)
Receptionist Doroteo Bradford reported to [facility] RN that pt came to front desk after finishing radiation appt today requesting to see PA [name].  Pt did not specify what her concern was.  PA [name] unavailable,  [facility] RN requested that receptionist alert MD [name]/ his office (desk RN Val) that pt was requesting to be seen today.  Spoke with RN Val who states that she was not alerted to pt's request to be seen, but that there was at least one VM from the patient asking to speak with Val/[name].  Val attempted to contact the patient multiple times via phone but got no answer.  No f/u requested at this time from [place] who states she will f/u with the patient.  PA [name] & MD [name] aware.

## 2019-10-15 ENCOUNTER — Ambulatory Visit: Payer: Medicare Other

## 2019-10-15 ENCOUNTER — Other Ambulatory Visit: Payer: Self-pay

## 2019-10-15 ENCOUNTER — Telehealth: Payer: Self-pay | Admitting: *Deleted

## 2019-10-15 ENCOUNTER — Ambulatory Visit
Admission: RE | Admit: 2019-10-15 | Discharge: 2019-10-15 | Disposition: A | Discharge status: Home / Self Care | Payer: Medicare Other | Source: Ambulatory Visit | Attending: Radiation Oncology | Admitting: Radiation Oncology

## 2019-10-15 DIAGNOSIS — C7931 Secondary malignant neoplasm of brain: Secondary | ICD-10-CM | POA: Diagnosis not present

## 2019-10-15 NOTE — Telephone Encounter (Addendum)
This RN spoke with pt today per her call wanting to " know what Van's test showed ".  This RN informed her labs and xray did not show concerns.  Starkeisha stated she went to radiation yesterday " but I don't think I want to go today- I'm tired ".  This RN discussed above- including that pt only has 2 more treatments- with great encouragement to complete current plan for best outcome.  Llona stated she is using " Reefer " nightly to help her sleep - " you know people with cancer use it and it helps"  This RN discussed above including benefits and concern for side effects that could have serious outcome ( including any care of her grandchild while using marijuana).  This RN also discussed need for pt to let us know of what she is using and taking in case she has confusion and issues that could be seen as worsening of brain tumors.  Rebia agreed that she is confused at times and that she does not watch her grandson while partaking in use of marijuana.  This RN reviewed new medication of zoloft that can have antianxiety benefits.  Cloteal states she does not want to use the zoloft and not really able to state why - except she likes " the reefer at night helps me a lot "  This RN again reiterated concern for her mental status and use of prescriptions and marijuana -   She states her daughter is " giving me my medications "  This RN again encouraged Lindalee to keep a journal of when she using " reefer " so if her daughters call with concern for confusion this office can assist in managing her care.  Per end of conversation- Maisey stated she is going to try her best to finish out her next 2 radiation treatments.  She stated understanding also of scheduled visit with [name] on 12/10 to discuss restarting chemotherapy.  This RN has placed a referral to palliative with discussion of pt's current disease status, mental status and home environment per need of information for visiting  nurse.  No further needs at this time.

## 2019-10-16 ENCOUNTER — Other Ambulatory Visit: Payer: Self-pay

## 2019-10-16 ENCOUNTER — Ambulatory Visit
Admission: RE | Admit: 2019-10-16 | Discharge: 2019-10-16 | Disposition: A | Discharge status: Home / Self Care | Payer: Medicare Other | Source: Ambulatory Visit | Attending: Radiation Oncology | Admitting: Radiation Oncology

## 2019-10-16 ENCOUNTER — Telehealth: Payer: Self-pay

## 2019-10-16 ENCOUNTER — Encounter: Payer: Self-pay | Admitting: Radiation Oncology

## 2019-10-16 DIAGNOSIS — C7931 Secondary malignant neoplasm of brain: Secondary | ICD-10-CM | POA: Diagnosis not present

## 2019-10-16 NOTE — Telephone Encounter (Signed)
Received Palliative Care referral. Phone call placed to patient to offer to schedule visit with Palliative Care. Phone rang with no answer.

## 2019-10-17 ENCOUNTER — Inpatient Hospital Stay: Payer: Medicare Other | Admitting: Oncology

## 2019-10-17 ENCOUNTER — Inpatient Hospital Stay: Payer: Medicare Other

## 2019-10-17 ENCOUNTER — Telehealth: Payer: Self-pay | Admitting: *Deleted

## 2019-10-17 ENCOUNTER — Other Ambulatory Visit: Payer: Self-pay | Admitting: Oncology

## 2019-10-17 NOTE — Telephone Encounter (Signed)
This RN spoke with pt per VM left by daughter stating pt was not feeling well and likely will not make appointment as scheduled today.  [name] states she feels nauseated and does not refills on her antinausea medication.  This RN discussed above including current use of decadron - first with pt stating " I took it "  But with further inquiry she stated " I finished radiation so I stopped it yesterday "  This RN discussed above medication can cause her not to feel well if she does not take it as prescribed- she cannot just stop it and needs to lower dose to stop.  [name] will resume decadron at 1/2 tab today.  This RN obtained appointments for pt for tomorrow.  Refill on antinausea medications called to [facility].

## 2019-10-17 NOTE — Progress Notes (Addendum)
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  She was evaluated by our CNS tumor group and started on whole brain radiation.  She was scheduled for 10 treatments, completed 5 as of 09/18/2019, then did not show for further treatments.  She saw me 10/09/2019 and had 1 more treatment on that date.  She tells me she did complete all 10 treatments.  I do not yet have the radiation summary available.  Treatment for her peripheral disease was interrupted because of all this.  Her last dose of protein bound paclitaxel/Abraxane and trastuzumab/pertuzumab was 07/30/2019.  She is here today to resume those treatments and to discuss follow-up of the brain metastases   REVIEW OF SYSTEMS: [name] tells me she has a little bit of nausea most mornings before she eats.  She has some gastritis symptoms as well.  It is very difficult to know what medication she is actually on at present.  We did what we could to update the medication list but she really needs to bring all her medications in and get it corrected.  She tells me that the Atarax is working, that she is no longer taking sertraline, and that she is on a half  a Decadron pill a day at this point.  She has occasional headaches.  Her vision is unchanged.  She has a good appetite.  There has been no change in bowel or bladder habits.  She says that when she does not get her chemotherapy her body hurts more especially her right shoulder and her knee.  A detailed review of systems today was otherwise stable.   BREAST CANCER HISTORY: From the original intake note:  The patient originally presented in May 2003 when she noticed a lump in the upper inner quadrant of her right breast in September 2003.  She sought attention and had a mammogram which showed an obvious carcinoma in the upper inner quadrant of the right breast, approximately 2 cm.  There were some enlarged lymph nodes in the axilla and an FNA done showed those consistent with malignant cells, most likely an invasive ductal carcinoma.  At that point she was unsure of what to do and was referred by Dr. Marylene [name] for a discussion of her treatment options.  By biopsy, it was ER/PR negative and HER-2 was 3+.  DNA index was 1.42.  We reiterated that it was most important to have her disease surgically addressed at that point and had recommended lumpectomy and axillary nodes to be addressed with which she followed through and on 04-03-02, Dr. Annamaria [name] performed a right partial mastectomy and a right axillary lymph node dissection.  Final pathology revealed a 2.4 cm. high grade, Grade III invasive ductal carcinoma with an adjacent .8 cm. also high grade invasive ductal carcinoma which was felt to represent an  intramammary metastasis rather than a second primary.  A smaller mass was just medial to the larger mass.  The smaller mass was associated with high grade DCIS component.  There was no definite lymphovascular invasion identified.  However, one of fourteen axillary lymph nodes did contain a 1.5 cm. metastatic deposit.  Postoperatively she did very well.  We reiterated the fact that she did need adjuvant chemotherapy,  however, she refused and decided that she would pursue radiation.  She received radiation and completed that on 08-14-02. Her subsequent history is as detailed below.   PAST MEDICAL HISTORY: Past Medical History:  Diagnosis Date  . Breast cancer ([name])   . DVT (deep venous thrombosis) ([name]) 10/07/2011  . Fever 02/06/2018  . Hypertension   . Radiation 11/29/2005-12/29/2005   right supraclavicular area 4147 cGy  . Radiation 06/26/02-08/14/02   right breast 5040 cGy, tumor bed boosted to 1260 cGy  . Rheumatic fever     PAST SURGICAL HISTORY: Past Surgical History:  Procedure Laterality Date  . BREAST SURGERY    . MASTECTOMY Bilateral   . PORT A CATH REVISION      FAMILY HISTORY Family History  Problem Relation Age of Onset  . Pancreatic cancer Mother   . Kidney cancer Sister 105  . Breast cancer Sister 33  . Breast cancer Other 15  The patient's father died in his 08Q  from complications of alcohol abuse. The patient's mother died in her 12s from pancreatic cancer. The patient has 6 brothers, 2 sisters. Both her sisters have had breast cancer, both diagnosed after the age of 21. The patient has not had genetic testing so far.   GYNECOLOGIC HISTORY: Menarche age 70; first live birth age 58. She is GXP4. She is not sure when she stopped having periods. She never used hormone replacement.   SOCIAL HISTORY: The patient has worked in the past as a Psychologist, counselling. At home in addition to the patient are her husband [name], originally from [place], who works as a Administrator.  Also living in the home is her daughter [name] (she is studying to be a Marine scientist) and grandson.  The other 2 children are [name], who has a college degree in psychology and works as a Probation officer; and [name], who lives in [place] and works as a Higher education careers adviser. Son [name] also sometimes stays in the home.    ADVANCED DIRECTIVES: Not in place   HEALTH MAINTENANCE: Social History   Tobacco Use  . Smoking status: Light  Tobacco Smoker  . Smokeless tobacco: Never Used  Substance Use Topics  . Alcohol use: Yes    Comment: Rarely  . Drug use: No     Colonoscopy:  Not on file  PAP:  Not on file  Bone density:  Not on file  Lipid panel:  Not on file    Allergies  Allergen Reactions  . Pork-Derived Products Other (See Comments)    Pt says she just doesn't eat pork; has no reaction to pork products  . Tramadol Nausea Only  . Hydrocodone-Acetaminophen Itching and Rash    Current Outpatient Medications  Medication Sig Dispense Refill  . ALPRAZolam (XANAX) 0.5 MG tablet Take 1 tablet (0.5 mg total) by mouth 2 (two) times daily as needed for anxiety. (Patient taking differently: Take 0.5 mg by mouth daily as needed for anxiety. ) 45 tablet 0  . Ascorbic Acid (VITAMIN C) 100 MG tablet Take 100 mg by mouth daily.      . cholecalciferol (VITAMIN  D) 1000 units tablet Take 1 tablet (1,000 Units total) by mouth daily. 90 tablet 4  . dexamethasone (DECADRON) 4 MG tablet Take 1 tablet (4 mg total) by mouth 4 (four) times daily. 90 tablet 0  . fluconazole (DIFLUCAN) 100 MG tablet Take 1 tablet (100 mg total) by mouth daily. 7 tablet 0  . gabapentin (NEURONTIN) 300 MG capsule Take 1 capsule (300 mg total) by mouth 2 (two) times daily as needed. (Patient taking differently: Take 300 mg by mouth 2 (two) times daily as needed (pain). ) 60 capsule 3  . hydrOXYzine (ATARAX/VISTARIL) 25 MG tablet TAKE 1 TABLET BY MOUTH THREE TIMES A DAY AS NEEDED (Patient taking differently: Take 25 mg by mouth 3 (three) times daily as needed for anxiety. ) 270 tablet 1  . magic mouthwash SOLN SWISH AND SPIT OR SWALLOW WITH 5MLS BEFORE MEALS AND AT BEDTIME 240 mL 1  . methadone (DOLOPHINE) 10 MG tablet Take 10 mg by mouth 3 (three) times daily.   0  . Multiple Vitamin (MULTIVITAMIN) capsule Take 1 capsule by mouth daily.      . Oxycodone HCl 20 MG TABS Take 20 mg by mouth every 4 (four) hours as needed (pain).     . potassium chloride  (MICRO-K) 10 MEQ CR capsule Take 2 capsules (20 mEq total) by mouth daily. 60 capsule 1  . prochlorperazine (COMPAZINE) 10 MG tablet Take 1 tablet (10 mg total) by mouth every 6 (six) hours as needed for nausea or vomiting. 30 tablet 0  . triamterene-hydrochlorothiazide (DYAZIDE) 37.5-25 MG capsule Take 1 each (1 capsule total) by mouth every morning. 30 capsule 3  . zolpidem (AMBIEN) 10 MG tablet Take 1 tablet (10 mg total) by mouth at bedtime as needed for sleep. 30 tablet 0   No current facility-administered medications for this visit.    Facility-Administered Medications Ordered in Other Visits  Medication Dose Route Frequency Provider Last Rate Last Dose  . sodium chloride 0.9 % injection 10 mL  10 mL Intravenous PRN Xavion Muscat, [name] Dad, MD   10 mL at 03/04/14 1421  . sodium chloride flush (NS) 0.9 % injection 10 mL  10 mL Intracatheter PRN Alease Fait, [name] Dad, MD   10 mL at 07/30/19 1627    OBJECTIVE: Middle-aged African-American woman   Vitals:   10/18/19 1214  BP: (!) 131/112  Pulse: 91  Resp: 18  Temp: 97.8 F (36.6 C)  SpO2: 100%  Body mass index is 31.67 kg/m.   ECOG FS:1 - Symptomatic but completely ambulatory  Sclerae unicteric, EOMs intact Wearing a mask No cervical or supraclavicular adenopathy Lungs no rales or rhonchi Heart regular rate and rhythm Abd soft, nontender, positive bowel sounds MSK no focal spinal tenderness Neuro: nonfocal Breasts: The right breast is status post mastectomy.  The left breast is benign.  Both axillae are benign. Skin: Lesions on the right arm, upper back and right shoulder area are stable  Photo 07/04/2017     Photo 08/23/2018    LAB RESULTS:.  Lab Results  Component Value Date   WBC 5.1 09/04/2019   NEUTROABS 3.6 07/30/2019   HGB 11.5 (L) 09/04/2019   HCT 37.5 09/04/2019   MCV 87.2 09/04/2019   PLT 290 09/04/2019      Chemistry      Component Value Date/Time   NA 137 09/04/2019 2325   NA 142 11/09/2017 1214     K 3.7 09/04/2019 2325   K 3.9 11/09/2017 1214   CL 101  09/04/2019 2325   CL 101 03/07/2013 1411   CO2 29 09/04/2019 2325   CO2 32 (H) 11/09/2017 1214   BUN 20 09/04/2019 2325   BUN 19.1 11/09/2017 1214   CREATININE 0.91 09/04/2019 2325   CREATININE 1.05 (H) 07/30/2019 1115   CREATININE 1.0 11/09/2017 1214      Component Value Date/Time   CALCIUM 9.1 09/04/2019 2325   CALCIUM 9.1 11/09/2017 1214   ALKPHOS 132 (H) 07/30/2019 1115   ALKPHOS 120 11/09/2017 1214   AST 27 07/30/2019 1115   AST 30 11/09/2017 1214   ALT 17 07/30/2019 1115   ALT 19 11/09/2017 1214   BILITOT <0.2 (L) 07/30/2019 1115   BILITOT <0.22 11/09/2017 1214       STUDIES: DG Chest 2 View  Result Date: 10/11/2019 CLINICAL DATA:  Productive cough.  History of breast cancer EXAM: CHEST - 2 VIEW COMPARISON:  09/24/2019 FINDINGS: Heart size and vascularity normal. Lungs are well aerated and clear. No focal area of infiltrate or effusion. Port-A-Cath tip at the cavoatrial junction unchanged. Thoracic degenerative change and spurring. IMPRESSION: No active cardiopulmonary disease. Electronically Signed   By: Franchot Gallo [name]   On: 10/11/2019 15:55   DG Chest 2 View  Result Date: 09/24/2019 CLINICAL DATA:  Cough, wheezing, shortness of breath EXAM: CHEST - 2 VIEW COMPARISON:  02/06/2018 FINDINGS: Mild cardiomegaly. Left chest port catheter. Mild, diffuse interstitial pulmonary opacity. Disc degenerative disease of the thoracic spine. IMPRESSION: Mild, diffuse interstitial pulmonary opacity, suggestive of edema in the setting of cardiomegaly or alternately atypical/viral infection. No focal airspace opacity. Electronically Signed   By: Eddie Candle [name]   On: 09/24/2019 15:42     ASSESSMENT:  63 y.o. [name] woman with stage IV breast cancer manifested chiefly by loco-regional nodal disease (neck, chest) and skin involvement, without liver, bone, or brain metastases documented   (1) Status post right upper inner  quadrant lumpectomy and sentinel lymph node sampling 03/04/2002 for 2 separate foci of invasive ductal carcinoma, mpT2 pN1 or stage IIB, both foci grade 3, both estrogen and progesterone receptor positive, both HercepTest 3+, Mib-1 56%  (2) Reexcision for margins 05/27/2002 showed no residual cancer in the breast.  (3) The patient refused adjuvant systemic therapy.  (4) Adjuvant radiation treatment completed 08/14/2002.  (5) recurrence in the right breast in 02/2004 showing a morphologically different tumor, again grade 3, again estrogen and progesterone receptor negative, with an MIB-1 of 14% and Herceptest 3+.  (5) Between 03/2004 and 07/2004 she received dose dense Doxorubicin/Cyclophosphamide x 4 given with trastuzumab, followed by weekly Docetaxel x 8, again given with trastuzumab.  (6) Right mastectomy 07/13/2004 showed scattered microscopic foci of residual disease over an area  greater than 5 cm. Margins were negative.  (7) Postoperative Docetaxel continued until 09/2004.  (8) Trastuzumab (Herceptin) given 08/2004 through 01/2012 with some brief interruptions.  RECURRENT/ STAGE IV DISEASE December 2006 (9) Isolated right cervical nodal recurrence 10/2005, treated with radiation to the right supraclavicular area (total 41.5 gray) completed 12/29/2005.  (10) Navelbine given together with Herceptin  11/2005 through 03/2006.  (9) Left mastectomy 02/13/2006 for ductal carcinoma in situ, grade 2, estrogen and progesterone receptor negative, with negative margins; 0 of 3 lymph nodes involved  (10) treated with Lapatinib and Capecitabine before 10/2009, for an unclear duration and with unclear results (cannot locate data on chart review).  (11) Status post right supraclavicular lymph node biopsy 09/2010 again positive for an invasive ductal carcinoma, estrogen and progesterone receptor negative,  HER-2 positive by [facility] with a ratio 4.25.  (12) Navelbine given together with Herceptin  between 05/2011 and 11/2011.  (13) Carboplatin/ Gemcitabine/ Herceptin given for 2 cycles, in 12/2011 and 01/2012.  (14) TDM-1 (K[place]la) started 02/2012. Last dose 10/02/2013 after which the patient discontinued treatment at her own discretion. Echo on December 2014 showed a well preserved ejection fraction.  (15) Deep vein thrombosis of the right upper extremity documented 04/20/2011.  She completed anticoagulant therapy with Coumadin on 03/25/2013.  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 Left chest Port-A-Cath placed on 04/08/2013; being flushed every 6 weeks  RIGHT UPPER EXTREMITY ANGIOSARCOMA VS BREAST CANCER: August 2016 (18) treated at [facility] of [place] August 2016 with paclitaxel, trastuzumab and pertuzumab 1 cycle, afterwards referred to hospice  (19) under the care of hospice of [facility] August 2016 to 05/08/2016, when the patient opted for a second try at chemotherapy  (20) started low-dose Abraxane, trastuzumab and pertuzumab 06/07/2016, to be repeated every 4 weeks.   (a) pretreatment echocardiogram 06/06/2016 showed a 60-65% ejection fraction  (b) significant compliance problems compromise response to treatment  (c)  echocardiogram 02/20/17 LVEF 55-60%  (d) Abraxane discontinued 07/04/2017 because of possible neuropathy symptoms  (e) Abraxane resumed 09/27/2017  (f) echocardiogram 07/20/2017 showed an ejection fraction of 60-65%  (g) echo 01/22/2018 showed an ejection fraction of 50-55%  (h) CT scan of the chest 05/03/2018 shows no disease involving the lungs liver or lymph nodes, with stable subcutaneous masses as previously noted  (I) Abraxane discontinued August 2019 because of concerns regarding neuropathy-- gemcitabine  substituted  (j) echocardiogram 08/20/2018 shows an ejection fraction in the 55-60% range (done every 6 months)  (k) gemcitabine discontinued because of multiple symptoms, Abraxane restarted 09/11/2018, given once every 4 weeks  (l) echocardiogram 07/17/2019 shows a well-preserved ejection fraction at 55-60%  (21) Chronic pain secondary to known metastatic disease  (a) patient signed pain contract 10/19/2016  (b) as of 11/16/2016 receives her pain medications from Dr [name]  (c)  Dr [name] no longer able to prescribe as of February 2019  (d) all pain medications now through [facility] (as of April 2019)  (22) left breast and left axillary adenopathy noted clinically  (a) left axillary lymph node biopsy on 07/24/2019 benign  BRAIN METASTASES: OCT 2020 (23) frontal and temporal lobe metastases noted on brain MRI 09/04/2019  (a) whole brain radiation started 10/09/2019, 10 doses plan  (b) consider tucatinib for maintenance  (24) restaging PET scan 08/20/2019 shows hypermetabolic adenopathy, subcutaneous malignancy as clinically appreciated, but no liver or lung parenchymal lesions  (a) Abraxane and trastuzumab/Pertuzumab treatments resumed 10/18/2019  (b) echo 07/17/2019 shows an ejection fraction of 55-60%.   PLAN: [name] is now 14 years out from definitive diagnosis of metastatic breast cancer.  Her peripheral disease is well controlled on her current medications, namely anti-HER-2 treatment (trastuzumab and Pertuzumab) and protein bound paclitaxel (Abraxane).  She tolerates these well, with no significant peripheral neuropathy, and we obtain echocardiography every 6 months.  She developed brain metastases about 6 weeks ago.  She received brain irradiation (I do not have the actual summary yet).  She is on a Decadron taper currently at 2 mg daily and she will take that for an additional 5 days.  [name] understands of  the treatments that I give her for her peripheral  disease do not cross the blood-brain barrier.  We can consider tucatinib.  My concern there is that she is noncompliant with medications and that a pill can have many side effects.  Nevertheless we will consider that perhaps starting at low-dose (300 mg once a day) and then assessing compliance and tolerance.  I am setting her up for repeat brain MRI early March but if the brain tumor group wishes to move that forward or backwards of course that would be fine.  I have asked [name] to bring all her medications next visit so that we can make sure to confirm what she is currently taking  Finally as far as I know her husband, who is from [place], would be her healthcare power of attorney.  We need to confirm that also at the next visit.    Hoang Reich, [name] Dad, MD  10/09/19 8:12 AM Medical Oncology and Hematology [facility] [place], [place] [address] Tel. [phone]    Fax. [phone]   I, Wilburn Mylar, am acting as scribe for Dr. Virgie [name]. Annaliesa Blann.  I, Lurline Del MD, have reviewed the above documentation for accuracy and completeness, and I agree with the above.

## 2019-10-18 ENCOUNTER — Inpatient Hospital Stay: Payer: Medicare Other

## 2019-10-18 ENCOUNTER — Other Ambulatory Visit: Payer: Self-pay

## 2019-10-18 ENCOUNTER — Inpatient Hospital Stay (HOSPITAL_BASED_OUTPATIENT_CLINIC_OR_DEPARTMENT_OTHER): Payer: Medicare Other | Admitting: Oncology

## 2019-10-18 VITALS — BP 104/74 | HR 84

## 2019-10-18 VITALS — BP 131/112 | HR 91 | Temp 97.8°F | Resp 18 | Ht 65.0 in | Wt 190.3 lb

## 2019-10-18 DIAGNOSIS — Z170 Estrogen receptor positive status [ER+]: Secondary | ICD-10-CM | POA: Diagnosis not present

## 2019-10-18 DIAGNOSIS — C4911 Malignant neoplasm of connective and soft tissue of right upper limb, including shoulder: Secondary | ICD-10-CM

## 2019-10-18 DIAGNOSIS — C50211 Malignant neoplasm of upper-inner quadrant of right female breast: Secondary | ICD-10-CM

## 2019-10-18 DIAGNOSIS — C7931 Secondary malignant neoplasm of brain: Secondary | ICD-10-CM

## 2019-10-18 DIAGNOSIS — Z5112 Encounter for antineoplastic immunotherapy: Secondary | ICD-10-CM | POA: Diagnosis not present

## 2019-10-18 DIAGNOSIS — Z95828 Presence of other vascular implants and grafts: Secondary | ICD-10-CM

## 2019-10-18 LAB — CBC WITH DIFFERENTIAL (CANCER CENTER ONLY)
Abs Immature Granulocytes: 0.41 10*3/uL — ABNORMAL HIGH (ref 0.00–0.07)
Basophils Absolute: 0 10*3/uL (ref 0.0–0.1)
Basophils Relative: 0 %
Eosinophils Absolute: 0.1 10*3/uL (ref 0.0–0.5)
Eosinophils Relative: 0 %
HCT: 45.2 % (ref 36.0–46.0)
Hemoglobin: 14.5 g/dL (ref 12.0–15.0)
Immature Granulocytes: 3 %
Lymphocytes Relative: 9 %
Lymphs Abs: 1.3 10*3/uL (ref 0.7–4.0)
MCH: 27.3 pg (ref 26.0–34.0)
MCHC: 32.1 g/dL (ref 30.0–36.0)
MCV: 85 fL (ref 80.0–100.0)
Monocytes Absolute: 1.1 10*3/uL — ABNORMAL HIGH (ref 0.1–1.0)
Monocytes Relative: 7 %
Neutro Abs: 11.8 10*3/uL — ABNORMAL HIGH (ref 1.7–7.7)
Neutrophils Relative %: 81 %
Platelet Count: 331 10*3/uL (ref 150–400)
RBC: 5.32 MIL/uL — ABNORMAL HIGH (ref 3.87–5.11)
RDW: 16.7 % — ABNORMAL HIGH (ref 11.5–15.5)
WBC Count: 14.6 10*3/uL — ABNORMAL HIGH (ref 4.0–10.5)
nRBC: 0 % (ref 0.0–0.2)

## 2019-10-18 LAB — CMP (CANCER CENTER ONLY)
ALT: 16 U/L (ref 0–44)
AST: 22 U/L (ref 15–41)
Albumin: 3.7 g/dL (ref 3.5–5.0)
Alkaline Phosphatase: 106 U/L (ref 38–126)
Anion gap: 10 (ref 5–15)
BUN: 27 mg/dL — ABNORMAL HIGH (ref 8–23)
CO2: 26 mmol/L (ref 22–32)
Calcium: 9.4 mg/dL (ref 8.9–10.3)
Chloride: 101 mmol/L (ref 98–111)
Creatinine: 1.17 mg/dL — ABNORMAL HIGH (ref 0.44–1.00)
GFR, Est AFR Am: 57 mL/min — ABNORMAL LOW (ref >60)
GFR, Estimated: 49 mL/min — ABNORMAL LOW (ref >60)
Glucose, Bld: 138 mg/dL — ABNORMAL HIGH (ref 70–99)
Potassium: 3.7 mmol/L (ref 3.5–5.1)
Sodium: 137 mmol/L (ref 135–145)
Total Bilirubin: 0.5 mg/dL (ref 0.3–1.2)
Total Protein: 7.8 g/dL (ref 6.5–8.1)

## 2019-10-18 MED ORDER — PROCHLORPERAZINE MALEATE 10 MG PO TABS
10.0000 mg | ORAL_TABLET | Freq: Once | ORAL | Status: AC
  Administered 2019-10-18: 10 mg via ORAL

## 2019-10-18 MED ORDER — DIPHENHYDRAMINE HCL 25 MG PO CAPS
ORAL_CAPSULE | ORAL | Status: AC
  Filled 2019-10-18: qty 2

## 2019-10-18 MED ORDER — TRASTUZUMAB-ANNS CHEMO 150 MG IV SOLR
6.0000 mg/kg | Freq: Once | INTRAVENOUS | Status: AC
  Administered 2019-10-18: 525 mg via INTRAVENOUS
  Filled 2019-10-18: qty 25

## 2019-10-18 MED ORDER — SODIUM CHLORIDE 0.9 % IV SOLN
420.0000 mg | Freq: Once | INTRAVENOUS | Status: AC
  Administered 2019-10-18: 420 mg via INTRAVENOUS
  Filled 2019-10-18: qty 14

## 2019-10-18 MED ORDER — SODIUM CHLORIDE 0.9% FLUSH
10.0000 mL | Freq: Once | INTRAVENOUS | Status: AC
  Administered 2019-10-18: 10 mL
  Filled 2019-10-18: qty 10

## 2019-10-18 MED ORDER — TUCATINIB 150 MG PO TABS: 300.0000 mg | ORAL_TABLET | Freq: Every morning | ORAL | 3 refills | Status: DC

## 2019-10-18 MED ORDER — ACETAMINOPHEN 325 MG PO TABS
ORAL_TABLET | ORAL | Status: AC
  Filled 2019-10-18: qty 2

## 2019-10-18 MED ORDER — HYDROXYZINE HCL 25 MG PO TABS: 25.0000 mg | ORAL_TABLET | Freq: Three times a day (TID) | ORAL | 0 refills | Status: DC | PRN

## 2019-10-18 MED ORDER — SODIUM CHLORIDE 0.9% FLUSH
10.0000 mL | INTRAVENOUS | Status: DC | PRN
  Administered 2019-10-18: 10 mL
  Filled 2019-10-18: qty 10

## 2019-10-18 MED ORDER — DIPHENHYDRAMINE HCL 25 MG PO CAPS
25.0000 mg | ORAL_CAPSULE | Freq: Once | ORAL | Status: AC
  Administered 2019-10-18: 25 mg via ORAL

## 2019-10-18 MED ORDER — SODIUM CHLORIDE 0.9 % IV SOLN
Freq: Once | INTRAVENOUS | Status: AC
  Administered 2019-10-18: 13:00:00 via INTRAVENOUS
  Filled 2019-10-18: qty 250

## 2019-10-18 MED ORDER — METHADONE HCL 10 MG PO TABS: 10.0000 mg | ORAL_TABLET | Freq: Three times a day (TID) | ORAL | 0 refills | Status: DC

## 2019-10-18 MED ORDER — PROCHLORPERAZINE MALEATE 10 MG PO TABS
ORAL_TABLET | ORAL | Status: AC
  Filled 2019-10-18: qty 1

## 2019-10-18 MED ORDER — HEPARIN SOD (PORK) LOCK FLUSH 100 UNIT/ML IV SOLN
500.0000 [IU] | Freq: Once | INTRAVENOUS | Status: AC | PRN
  Administered 2019-10-18: 500 [IU]
  Filled 2019-10-18: qty 5

## 2019-10-18 MED ORDER — PACLITAXEL PROTEIN-BOUND CHEMO INJECTION 100 MG
100.0000 mg/m2 | Freq: Once | INTRAVENOUS | Status: AC
  Administered 2019-10-18: 200 mg via INTRAVENOUS
  Filled 2019-10-18: qty 40

## 2019-10-18 MED ORDER — ACETAMINOPHEN 325 MG PO TABS
650.0000 mg | ORAL_TABLET | Freq: Once | ORAL | Status: AC
  Administered 2019-10-18: 13:00:00 650 mg via ORAL

## 2019-10-18 NOTE — Addendum Note (Signed)
Addended by: Chauncey Cruel on: 10/18/2019 01:13 PM   Modules accepted: Orders

## 2019-10-18 NOTE — Patient Instructions (Signed)
[facility] Discharge Instructions for Patients Receiving Chemotherapy  Today you received the following chemotherapy agents:  Trastuzumab, Pertuzumab, Abraxane  To help prevent nausea and vomiting after your treatment, we encourage you to take your nausea medication as prescribed.   If you develop nausea and vomiting that is not controlled by your nausea medication, call the clinic.   BELOW ARE SYMPTOMS THAT SHOULD BE REPORTED IMMEDIATELY:  *FEVER GREATER THAN 100.5 F  *CHILLS WITH OR WITHOUT FEVER  NAUSEA AND VOMITING THAT IS NOT CONTROLLED WITH YOUR NAUSEA MEDICATION  *UNUSUAL SHORTNESS OF BREATH  *UNUSUAL BRUISING OR BLEEDING  TENDERNESS IN MOUTH AND THROAT WITH OR WITHOUT PRESENCE OF ULCERS  *URINARY PROBLEMS  *BOWEL PROBLEMS  UNUSUAL RASH Items with * indicate a potential emergency and should be followed up as soon as possible.  Feel free to call the clinic should you have any questions or concerns. The clinic phone number is [phone].  Please show the [facility] at check-in to the Emergency Department and triage nurse.

## 2019-10-21 ENCOUNTER — Telehealth: Payer: Self-pay

## 2019-10-21 NOTE — Telephone Encounter (Signed)
Phone call placed to patient to offer to schedule visit with Palliative Care. VM left 

## 2019-10-21 NOTE — Telephone Encounter (Signed)
Phone call placed to patient's daughter to introduce Palliative Care and to offer to schedule a visit with NP. VM left

## 2019-10-22 ENCOUNTER — Telehealth: Payer: Self-pay | Admitting: Pharmacist

## 2019-10-22 DIAGNOSIS — Z170 Estrogen receptor positive status [ER+]: Secondary | ICD-10-CM

## 2019-10-22 DIAGNOSIS — C50211 Malignant neoplasm of upper-inner quadrant of right female breast: Secondary | ICD-10-CM

## 2019-10-22 NOTE — Telephone Encounter (Signed)
Oral Oncology Pharmacist Encounter  Received new prescription for Tukysa (tucatinib) for the treatment of stage IV breast cancer in conjunction with an anti- HER2 agent and capecitabine, planned duration until disease progression or unacceptable drug toxicity. The capecitabine will be started if she tolerates the tucatinib.   Planned start after Christmas on 11/04/2019.  CMP from 10/18/2019 assessed, no relevant lab abnormalities. Prescription dose and frequency assessed.   Current medication list in Epic reviewed, two DDIs with tucatinib identified: -Methadone: tucatinib may increase the concentration of methadone. Spoke with Dr. Jana Hakim about the recommendation and recommended either reducing the methadone dose or decreasing the tucatinib dose. Dr. Jana Hakim does not think that the patient will want to decrease her methodone and would like to decrease her starting dose to 176m daily for the first week then increase as tolerated. Additionally I recommended that we send in a Narcan nasal spray to the patient to have at home incase of an emergency.  -Dexamethasone: tucatinib may increase the concentration of dexamethasone. Monitor patient for side effects related to increased dexamethasone exposure. No baselin dose adjustment needed.  [facility].  Oral Oncology Clinic will continue to follow for insurance authorization, copayment issues, initial counseling and start date.  ADarl Pikes PharmD, BCPS, BSurgcenter Of Greater DallasHematology/Oncology Clinical Pharmacist [facility]/AP [phone] 10/22/2019 11:27 AM

## 2019-10-23 ENCOUNTER — Telehealth: Payer: Self-pay | Admitting: *Deleted

## 2019-10-23 NOTE — Telephone Encounter (Signed)
Pt left message stating " why am on an antibiotic ?"  This RN returned call and obtained VM- message left informing pt an antibiotic has not been prescribed by this office. This RN asked her to look at the bottle she has concerns about and call again.

## 2019-10-24 ENCOUNTER — Telehealth: Payer: Self-pay

## 2019-10-24 NOTE — Telephone Encounter (Signed)
Oral Oncology Patient Advocate Encounter  Received notification from [place]  that prior authorization for Laury [name] is required.  PA submitted on CoverMyMeds Key [id number] Status is pending  Oral Oncology Clinic will continue to follow.  [facility] Patient [place] Phone [phone] Fax [phone] 10/24/2019 10:58 AM

## 2019-10-24 NOTE — Telephone Encounter (Signed)
Oral Oncology Patient Advocate Encounter  Prior Authorization for [name] has been approved.    PA# [id number] Effective dates: 10/24/19 through 11/06/20  Patients co-pay is $3.90  Oral Oncology Clinic will continue to follow.   [place] Patient [place] Phone [phone] Fax [phone] 10/24/2019 11:05 AM

## 2019-10-25 MED ORDER — NALOXONE HCL 4 MG/0.1ML NA LIQD: NASAL | 2 refills | Status: DC

## 2019-10-25 MED ORDER — TUCATINIB 150 MG PO TABS: 300.0000 mg | ORAL_TABLET | Freq: Every morning | ORAL | 3 refills | Status: DC

## 2019-10-28 ENCOUNTER — Telehealth: Payer: Self-pay

## 2019-10-28 NOTE — Telephone Encounter (Signed)
Message left for patient to offer to schedule visit with Palliative Care. Call back information provided.

## 2019-10-29 ENCOUNTER — Other Ambulatory Visit: Payer: Self-pay | Admitting: *Deleted

## 2019-10-29 MED ORDER — AZITHROMYCIN 250 MG PO TABS: ORAL_TABLET | ORAL | 0 refills | Status: DC

## 2019-10-29 NOTE — Telephone Encounter (Signed)
Oral Chemotherapy Pharmacist Encounter  Medication will be mailed out from [facility] for delivery Thursday 12/24 or by 12/28 due to holiday shipping. She knows not to start until 12/28.  Patient Education I spoke with patient and her daughter for overview of new oral chemotherapy medication: Tukysa (tucatinib) for the treatment of stage IV breast cancer in conjunction with an anti- HER2 agent and capecitabine, planned duration until disease progression or unacceptable drug toxicity. The capecitabine will be started if she tolerates the tucatinib.   Planned start after Christmas on 11/04/2019.   Counseled on administration, dosing, side effects, monitoring, drug-food interactions, safe handling, storage, and disposal. Patient will take 1 tablet daily then increase to 2 tablets (300 mg total) by mouth every morning when directed by Dr. Jana Hakim.  Side effects include but not limited to: diarrhea, hand-foot syndrome, N/V.    Reviewed with patient importance of keeping a medication schedule and plan for any missed doses.  Ms. Osberg voiced understanding and appreciation. All questions answered. Medication handout placed in the mail.  Provided patient with [facility] phone number. Patient knows to call the office with questions or concerns. Oral Chemotherapy Navigation [facility] will continue to follow.  Darl Pikes, PharmD, BCPS, [facility] Hematology/Oncology Clinical Pharmacist [facility] [phone]  10/29/2019 10:30 AM

## 2019-10-30 ENCOUNTER — Other Ambulatory Visit: Payer: Self-pay | Admitting: Oncology

## 2019-10-30 ENCOUNTER — Telehealth: Payer: Self-pay

## 2019-10-30 MED FILL — TUKYSA 150 MG TABS: 150 | 30 days supply | Qty: 60 | Fill #0

## 2019-10-30 MED FILL — NARCAN 4 MG NASAL SPRAY: 4 | 1 days supply | Qty: 2 | Fill #0

## 2019-10-30 NOTE — Telephone Encounter (Signed)
TC from Pt stating she needed a prescription for oxycodone. Pt stated she lost her prescription. Per Dr. Jana Hakim Pt. Received this medication from another Dr. And she should call that Dr. To get another prescription. Pt called no voicemail to leave message

## 2019-11-05 ENCOUNTER — Telehealth: Payer: Self-pay

## 2019-11-05 NOTE — Telephone Encounter (Signed)
Spoke with patient regarding Palliative Care referral. Explained Palliative care services. Patient receptive to scheduling a Telehealth visit with NP. Scheduled for 11/12/19 @ 2pm. Verbal consent obtained.

## 2019-11-12 ENCOUNTER — Other Ambulatory Visit: Payer: Self-pay

## 2019-11-12 ENCOUNTER — Telehealth: Payer: Self-pay | Admitting: Hospice

## 2019-11-12 ENCOUNTER — Other Ambulatory Visit: Payer: Medicare HMO | Admitting: Hospice

## 2019-11-12 NOTE — Telephone Encounter (Signed)
Called [name] on her home and cell tel numbers for scheduled palliative consult and left her voicemail with call back number.

## 2019-11-13 NOTE — Progress Notes (Signed)
[name]  [phone] [phone]     ID: [name]   DOB: [birth year]  MR#: [id number]  UUE#:[id number]  Patient Care Team: Patient, No Pcp Per as PCP - General (General Practice) [name], [name], MD as Consulting Physician (Oncology) [name], MD as Consulting Physician (Radiation Oncology) [name], [name], MD as Consulting Physician (Cardiology) [name] [name], MD as Consulting Physician (Psychiatry) [name], [name] 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   [As far as her peripheral disease is concerned, she has been receiving abraxane every 28 days.  She has denied any neuropathy or other significant side effects from this medication.  She also continues on  trastuzumab and pertuzumab, which she receives every 28 days. [name]'s last echocardiogram on 07/17/2019, showed an ejection fraction in the 55% - 60% range.   She was scheduled to begin tucatinib on 11/04/2019.  She was also scheduled for telephone consultation with palliative care on 11/12/2019, but she did not answer her home or cell phone.]   REVIEW OF SYSTEMS: [name]    BREAST CANCER HISTORY: From the original intake note:  The patient originally presented in May 2003 when she noticed a lump in the upper inner quadrant of her right breast in September 2003.  She sought attention and had a mammogram which showed an obvious carcinoma in the upper inner quadrant of the right breast, approximately 2 cm.  There were some enlarged lymph nodes in the axilla and an FNA done showed those consistent with malignant cells, most likely an invasive ductal carcinoma.  At that point she was unsure of what to do and was referred by Dr. [name] for a discussion of her treatment options.  By biopsy, it was ER/PR negative and HER-2 was 3+.  DNA index was 1.42.  We reiterated that it was most important to have her disease surgically addressed at that point and had recommended lumpectomy and axillary nodes to be addressed with which she followed through and on 04-03-02, Dr. [name] performed a right partial mastectomy and a right axillary lymph node dissection.  Final pathology revealed a 2.4 cm. high grade, Grade III invasive ductal carcinoma with an adjacent .8 cm. also high grade invasive ductal carcinoma which was felt to represent an intramammary metastasis rather than a second primary.  A smaller mass was just medial to the larger mass.  The smaller mass was associated with high grade DCIS component.  There was no definite lymphovascular invasion identified.  However, one of fourteen axillary lymph nodes did contain a 1.5 cm. metastatic deposit.  Postoperatively she did very well.  We reiterated the fact that she did need adjuvant chemotherapy, however, she refused and decided that she would pursue radiation.  She received radiation and completed that on 08-14-02. Her subsequent history is as detailed below.   PAST MEDICAL HISTORY: Past Medical History:  Diagnosis Date  . Breast cancer ([place])   . DVT (deep venous thrombosis) ([place]) 10/07/2011  . Fever 02/06/2018  . Hypertension   . Radiation 11/29/2005-12/29/2005   right supraclavicular area 4147 cGy  . Radiation 06/26/02-08/14/02   right breast 5040 cGy, tumor bed boosted to 1260 cGy  . Rheumatic fever     PAST SURGICAL HISTORY: Past Surgical History:  Procedure Laterality Date  .  BREAST SURGERY    . MASTECTOMY Bilateral   . PORT A CATH REVISION      FAMILY HISTORY Family History  Problem Relation Age of Onset  . Pancreatic cancer Mother   . Kidney cancer Sister 80  . Breast cancer Sister 24  . Breast cancer Other 47  The patient's father died in his 08M  from complications  of alcohol abuse. The patient's mother died in her 67s from pancreatic cancer. The patient has 6 brothers, 2 sisters. Both her sisters have had breast cancer, both diagnosed after the age of 59. The patient has not had genetic testing so far.   GYNECOLOGIC HISTORY: Menarche age 51; first live birth age 89. She is GXP4. She is not sure when she stopped having periods. She never used hormone replacement.   SOCIAL HISTORY: The patient has worked in the past as a Psychologist, counselling. At home in addition to the patient are her husband [name], originally from [place], who works as a Administrator.  Also living in the home is her daughter [name] (she is studying to be a Marine scientist) and grandson.  The other 2 children are [name], who has a college degree in psychology and works as a Probation officer; and [name], who lives in [place] and works as a Higher education careers adviser. Son [name] also sometimes stays in the home. In addition the patient has an aide who helps her almost daily.    ADVANCED DIRECTIVES: Not in place   HEALTH MAINTENANCE: Social History   Tobacco Use  . Smoking status: Light Tobacco Smoker  . Smokeless tobacco: Never Used  Substance Use Topics  . Alcohol use: Yes    Comment: Rarely  . Drug use: No     Colonoscopy:  Not on file  PAP:  Not on file  Bone density:  Not on file  Lipid panel:  Not on file   Allergies  Allergen Reactions  . Tramadol Nausea Only  . Hydrocodone-Acetaminophen Itching and Rash  . Pork-Derived Products Other (See Comments)    Pt says she just doesn't eat pork; has no reaction to pork products    Current Outpatient Medications  Medication Sig Dispense Refill  . Ascorbic Acid (VITAMIN C) 100 MG tablet Take 100 mg by mouth daily.      Mar[place] Kitchen azithromycin (ZITHROMAX Z-PAK) 250 MG tablet Take as directed 6 each 0  . cholecalciferol (VITAMIN D) 1000 units tablet Take 1 tablet (1,000 Units total) by mouth daily. 90 tablet 4  . dexamethasone (DECADRON) 4 MG tablet Take 0.5 tablets  (2 mg total) by mouth daily. 60 tablet 0  . gabapentin (NEURONTIN) 100 MG capsule Take 1 capsule (300 mg total) by mouth 2 (two) times daily as needed. 60 capsule 3  . hydrOXYzine (ATARAX/VISTARIL) 25 MG tablet Take 1 tablet (25 mg total) by mouth 3 (three) times daily as needed. 270 tablet 0  . magic mouthwash SOLN SWISH AND SPIT OR SWALLOW WITH 5MLS BEFORE MEALS AND AT BEDTIME 240 mL 1  . methadone (DOLOPHINE) 10 MG tablet Take 1 tablet (10 mg total) by mouth 3 (three) times daily. 30 tablet 0  . Multiple Vitamin (MULTIVITAMIN) capsule Take 1 capsule by mouth daily.      . naloxone (NARCAN) nasal spray 4 mg/0.1 mL Administer single spray of naloxone into one nostril. Additionally, spray can be given every 2-3 minutes as needed until emergency medical assistance arrives. Call 911 immediately after use. 1 each 2  . potassium chloride (MICRO-K) 10 MEQ CR capsule  Take 2 capsules (20 mEq total) by mouth daily. 60 capsule 1  . prochlorperazine (COMPAZINE) 10 MG tablet Take 1 tablet (10 mg total) by mouth every 6 (six) hours as needed for nausea or vomiting. 30 tablet 1  . sertraline (ZOLOFT) 100 MG tablet Take 1 tablet (100 mg total) by mouth daily. 30 tablet 5  . triamterene-hydrochlorothiazide (DYAZIDE) 37.5-25 MG capsule Take 1 each (1 capsule total) by mouth every morning. 30 capsule 3  . tucatinib (TUKYSA) 150 MG tablet Take 2 tablets (300 mg total) by mouth every morning. Take as directed by MD. 60 tablet 3   No current facility-administered medications for this visit.   Facility-Administered Medications Ordered in Other Visits  Medication Dose Route Frequency Provider Last Rate Last Admin  . sodium chloride 0.9 % injection 10 mL  10 mL Intravenous PRN [name], [name], MD   10 mL at 03/04/14 1421  . sodium chloride flush (NS) 0.9 % injection 10 mL  10 mL Intracatheter PRN [name], [name], MD   10 mL at 07/30/19 1627    OBJECTIVE: Middle-aged African-American woman   There were no  vitals filed for this visit. Wt Readings from Last 3 Encounters:  10/18/19 190 lb 4.8 oz (86.3 kg)  10/11/19 203 lb 3.2 oz (92.2 kg)  10/09/19 200 lb 14.4 oz (91.1 kg)   There is no height or weight on file to calculate BMI.     Photo 07/04/2017     Photo 08/23/2018    LAB RESULTS:.  Lab Results  Component Value Date   WBC 14.6 (H) 10/18/2019   NEUTROABS 11.8 (H) 10/18/2019   HGB 14.5 10/18/2019   HCT 45.2 10/18/2019   MCV 85.0 10/18/2019   PLT 331 10/18/2019      Chemistry      Component Value Date/Time   NA 137 10/18/2019 1153   NA 142 11/09/2017 1214   K 3.7 10/18/2019 1153   K 3.9 11/09/2017 1214   CL 101 10/18/2019 1153   CL 101 03/07/2013 1411   CO2 26 10/18/2019 1153   CO2 32 (H) 11/09/2017 1214   BUN 27 (H) 10/18/2019 1153   BUN 19.1 11/09/2017 1214   CREATININE 1.17 (H) 10/18/2019 1153   CREATININE 1.0 11/09/2017 1214      Component Value Date/Time   CALCIUM 9.4 10/18/2019 1153   CALCIUM 9.1 11/09/2017 1214   ALKPHOS 106 10/18/2019 1153   ALKPHOS 120 11/09/2017 1214   AST 22 10/18/2019 1153   AST 30 11/09/2017 1214   ALT 16 10/18/2019 1153   ALT 19 11/09/2017 1214   BILITOT 0.5 10/18/2019 1153   BILITOT <0.22 11/09/2017 1214       STUDIES: No results found.   ASSESSMENT:  64 y.o. [place] woman with stage IV breast cancer manifested chiefly by loco-regional nodal disease (neck, chest) and skin involvement, without liver, bone, or brain metastases documented   (1) Status post right upper inner quadrant lumpectomy and sentinel lymph node sampling 03/04/2002 for 2 separate foci of invasive ductal carcinoma, mpT2 pN1 or stage IIB, both foci grade 3, both estrogen and progesterone receptor positive, both HercepTest 3+, Mib-1 56%  (2) Reexcision for margins 05/27/2002 showed no residual cancer in the breast.  (3) The patient refused adjuvant systemic therapy.  (4) Adjuvant radiation treatment completed 08/14/2002.  (5) recurrence in the  right breast in 02/2004 showing a morphologically different tumor, again grade 3, again estrogen and progesterone receptor negative, with an MIB-1 of 14% and  Herceptest 3+.  (5) Between 03/2004 and 07/2004 she received dose dense Doxorubicin/Cyclophosphamide x 4 given with trastuzumab, followed by weekly Docetaxel x 8, again given with trastuzumab.  (6) Right mastectomy 07/13/2004 showed scattered microscopic foci of residual disease over an area  greater than 5 cm. Margins were negative.  (7) Postoperative Docetaxel continued until 09/2004.  (8) Trastuzumab (Herceptin) given 08/2004 through 01/2012 with some brief interruptions.  RECURRENT/ STAGE IV DISEASE December 2006 (9) Isolated right cervical nodal recurrence 10/2005, treated with radiation to the right supraclavicular area (total 41.5 gray) completed 12/29/2005.  (10) Navelbine given together with Herceptin  11/2005 through 03/2006.  (9) Left mastectomy 02/13/2006 for ductal carcinoma in situ, grade 2, estrogen and progesterone receptor negative, with negative margins; 0 of 3 lymph nodes involved  (10) treated with Lapatinib and Capecitabine before 10/2009, for an unclear duration and with unclear results (cannot locate data on chart review).  (11) Status post right supraclavicular lymph node biopsy 09/2010 again positive for an invasive ductal carcinoma, estrogen and progesterone receptor negative, HER-2 positive by CISH with a ratio 4.25.  (12) Navelbine given together with Herceptin between 05/2011 and 11/2011.  (13) Carboplatin/ Gemcitabine/ Herceptin given for 2 cycles, in 12/2011 and 01/2012.  (14) TDM-1 ([place]cyla) started 02/2012. Last dose 10/02/2013 after which the patient discontinued treatment at her own discretion. Echo on December 2014 showed a well preserved ejection fraction.  (15) Deep vein thrombosis of the right upper extremity documented 04/20/2011.  She completed anticoagulant therapy with Coumadin on  03/25/2013.  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 Left chest Port-A-Cath placed on 04/08/2013; being flushed every 6 weeks  RIGHT UPPER EXTREMITY ANGIOSARCOMA VS BREAST CANCER: August 2016 (18) treated at [facility] of [place] August 2016 with paclitaxel, trastuzumab and pertuzumab 1 cycle, afterwards referred to hospice  (19) under the care of hospice of [facility] August 2016 to 05/08/2016, when the patient opted for a second try at chemotherapy  (20) started low-dose Abraxane, trastuzumab and pertuzumab 06/07/2016, to be repeated every 4 weeks.   (a) pretreatment echocardiogram 06/06/2016 showed a 60-65% ejection fraction  (b) significant compliance problems compromise response to treatment  (c)  echocardiogram 02/20/17 LVEF 55-60%  (d) Abraxane discontinued 07/04/2017 because of possible neuropathy symptoms  (e) Abraxane resumed 09/27/2017  (f) echocardiogram 07/20/2017 showed an ejection fraction of 60-65%  (g) echo 01/22/2018 showed an ejection fraction of 50-55%  (h) CT scan of the chest 05/03/2018 shows no disease involving the lungs liver or lymph nodes, with stable subcutaneous masses as previously noted  (I) Abraxane discontinued August 2019 because of concerns regarding neuropathy-- gemcitabine substituted  (j) echocardiogram 08/20/2018 shows an ejection fraction in the 55-60% range (done every 6 months)  (k) gemcitabine discontinued because of multiple symptoms, Abraxane restarted 09/11/2018, given once every 4 weeks  (l) echocardiogram 07/17/2019 shows a well-preserved ejection fraction at 55-60%  (21) Chronic pain secondary to known metastatic disease  (a) patient signed pain contract 10/19/2016  (b) as of 11/16/2016 receives  her pain medications from Dr [name]  (c)  Dr [name] no longer able to prescribe as of February 2019  (d) all pain medications now through [facility] (as of April 2019)  (22) left breast and left axillary adenopathy noted clinically  (a) left axillary lymph  node biopsy on 07/24/2019 benign  (23) frontal and temporal lobe metastases noted on brain MRI 09/04/2019  (a) adjuvant radiation ongoing.  (24) restaging PET scan 08/20/2019 shows hypermetabolic adenopathy, subcutaneous malignancy as clinically appreciated, but no liver or lung parenchymal lesions   PLAN: Jennel did no-show for her visit 11/13/2018.  A letter requesting rescheduling has been sent.   [name], [name], MD  11/13/19 3:31 PM Medical Oncology and Hematology [facility] [place], [place] [address] Tel. [phone]    Fax. [phone]   I, Wilburn Mylar, am acting as scribe for Dr. [name]. [name].  I, Lurline Del MD, have reviewed the above documentation for accuracy and completeness, and I agree with the above.

## 2019-11-14 ENCOUNTER — Inpatient Hospital Stay (HOSPITAL_BASED_OUTPATIENT_CLINIC_OR_DEPARTMENT_OTHER): Payer: Medicaid Other | Admitting: Oncology

## 2019-11-14 ENCOUNTER — Encounter: Payer: Self-pay | Admitting: Oncology

## 2019-11-14 ENCOUNTER — Inpatient Hospital Stay: Payer: Medicaid Other | Attending: Oncology

## 2019-11-14 ENCOUNTER — Inpatient Hospital Stay: Payer: Medicaid Other

## 2019-11-14 DIAGNOSIS — C50211 Malignant neoplasm of upper-inner quadrant of right female breast: Secondary | ICD-10-CM

## 2019-11-14 DIAGNOSIS — C4911 Malignant neoplasm of connective and soft tissue of right upper limb, including shoulder: Secondary | ICD-10-CM

## 2019-11-14 DIAGNOSIS — Z170 Estrogen receptor positive status [ER+]: Secondary | ICD-10-CM

## 2019-11-17 ENCOUNTER — Other Ambulatory Visit: Payer: Self-pay | Admitting: Medical

## 2019-11-18 ENCOUNTER — Telehealth: Payer: Self-pay | Admitting: Oncology

## 2019-11-18 NOTE — Telephone Encounter (Signed)
Created in error

## 2019-11-19 ENCOUNTER — Emergency Department (HOSPITAL_COMMUNITY): Payer: Medicare HMO

## 2019-11-19 ENCOUNTER — Inpatient Hospital Stay (HOSPITAL_COMMUNITY): Payer: Medicare HMO

## 2019-11-19 ENCOUNTER — Telehealth: Payer: Self-pay | Admitting: *Deleted

## 2019-11-19 ENCOUNTER — Inpatient Hospital Stay (HOSPITAL_COMMUNITY)
Admission: EM | Admit: 2019-11-19 | Discharge: 2019-11-21 | DRG: 917 | Disposition: A | Discharge status: Home / Self Care | Payer: Medicare HMO | Attending: Student | Admitting: Student

## 2019-11-19 ENCOUNTER — Telehealth: Payer: Self-pay | Admitting: Radiation Oncology

## 2019-11-19 ENCOUNTER — Other Ambulatory Visit: Payer: Self-pay

## 2019-11-19 ENCOUNTER — Encounter (HOSPITAL_COMMUNITY): Payer: Self-pay | Admitting: Emergency Medicine

## 2019-11-19 DIAGNOSIS — C50211 Malignant neoplasm of upper-inner quadrant of right female breast: Secondary | ICD-10-CM

## 2019-11-19 DIAGNOSIS — U071 COVID-19: Secondary | ICD-10-CM | POA: Diagnosis not present

## 2019-11-19 DIAGNOSIS — Z923 Personal history of irradiation: Secondary | ICD-10-CM

## 2019-11-19 DIAGNOSIS — E872 Acidosis: Secondary | ICD-10-CM | POA: Diagnosis present

## 2019-11-19 DIAGNOSIS — Z7952 Long term (current) use of systemic steroids: Secondary | ICD-10-CM

## 2019-11-19 DIAGNOSIS — T40605A Adverse effect of unspecified narcotics, initial encounter: Secondary | ICD-10-CM | POA: Diagnosis present

## 2019-11-19 DIAGNOSIS — J9602 Acute respiratory failure with hypercapnia: Secondary | ICD-10-CM | POA: Diagnosis present

## 2019-11-19 DIAGNOSIS — Z170 Estrogen receptor positive status [ER+]: Secondary | ICD-10-CM

## 2019-11-19 DIAGNOSIS — R140 Abdominal distension (gaseous): Secondary | ICD-10-CM

## 2019-11-19 DIAGNOSIS — Z79891 Long term (current) use of opiate analgesic: Secondary | ICD-10-CM | POA: Diagnosis not present

## 2019-11-19 DIAGNOSIS — R29898 Other symptoms and signs involving the musculoskeletal system: Secondary | ICD-10-CM | POA: Diagnosis not present

## 2019-11-19 DIAGNOSIS — C4911 Malignant neoplasm of connective and soft tissue of right upper limb, including shoulder: Secondary | ICD-10-CM | POA: Diagnosis not present

## 2019-11-19 DIAGNOSIS — Z9013 Acquired absence of bilateral breasts and nipples: Secondary | ICD-10-CM

## 2019-11-19 DIAGNOSIS — Z9221 Personal history of antineoplastic chemotherapy: Secondary | ICD-10-CM | POA: Diagnosis not present

## 2019-11-19 DIAGNOSIS — D638 Anemia in other chronic diseases classified elsewhere: Secondary | ICD-10-CM | POA: Diagnosis present

## 2019-11-19 DIAGNOSIS — G92 Toxic encephalopathy: Secondary | ICD-10-CM | POA: Diagnosis present

## 2019-11-19 DIAGNOSIS — C7931 Secondary malignant neoplasm of brain: Secondary | ICD-10-CM | POA: Diagnosis not present

## 2019-11-19 DIAGNOSIS — T40601A Poisoning by unspecified narcotics, accidental (unintentional), initial encounter: Secondary | ICD-10-CM | POA: Diagnosis present

## 2019-11-19 DIAGNOSIS — R4182 Altered mental status, unspecified: Secondary | ICD-10-CM | POA: Diagnosis not present

## 2019-11-19 DIAGNOSIS — I10 Essential (primary) hypertension: Secondary | ICD-10-CM | POA: Diagnosis present

## 2019-11-19 DIAGNOSIS — G9340 Encephalopathy, unspecified: Secondary | ICD-10-CM | POA: Diagnosis present

## 2019-11-19 DIAGNOSIS — K5900 Constipation, unspecified: Secondary | ICD-10-CM | POA: Diagnosis present

## 2019-11-19 DIAGNOSIS — G936 Cerebral edema: Secondary | ICD-10-CM | POA: Diagnosis present

## 2019-11-19 DIAGNOSIS — J9601 Acute respiratory failure with hypoxia: Secondary | ICD-10-CM | POA: Diagnosis present

## 2019-11-19 DIAGNOSIS — Z1501 Genetic susceptibility to malignant neoplasm of breast: Secondary | ICD-10-CM

## 2019-11-19 DIAGNOSIS — Z8051 Family history of malignant neoplasm of kidney: Secondary | ICD-10-CM

## 2019-11-19 DIAGNOSIS — G893 Neoplasm related pain (acute) (chronic): Secondary | ICD-10-CM | POA: Diagnosis present

## 2019-11-19 DIAGNOSIS — Z79899 Other long term (current) drug therapy: Secondary | ICD-10-CM

## 2019-11-19 DIAGNOSIS — Z803 Family history of malignant neoplasm of breast: Secondary | ICD-10-CM | POA: Diagnosis not present

## 2019-11-19 DIAGNOSIS — C50911 Malignant neoplasm of unspecified site of right female breast: Secondary | ICD-10-CM | POA: Diagnosis not present

## 2019-11-19 DIAGNOSIS — R748 Abnormal levels of other serum enzymes: Secondary | ICD-10-CM | POA: Diagnosis not present

## 2019-11-19 DIAGNOSIS — Z800 Family history of malignant neoplasm of digestive organs: Secondary | ICD-10-CM | POA: Diagnosis not present

## 2019-11-19 DIAGNOSIS — G8929 Other chronic pain: Secondary | ICD-10-CM | POA: Diagnosis not present

## 2019-11-19 DIAGNOSIS — F17210 Nicotine dependence, cigarettes, uncomplicated: Secondary | ICD-10-CM | POA: Diagnosis present

## 2019-11-19 DIAGNOSIS — Z885 Allergy status to narcotic agent status: Secondary | ICD-10-CM

## 2019-11-19 DIAGNOSIS — Z853 Personal history of malignant neoplasm of breast: Secondary | ICD-10-CM

## 2019-11-19 LAB — BLOOD GAS, VENOUS
Acid-Base Excess: 3.5 mmol/L — ABNORMAL HIGH (ref 0.0–2.0)
Bicarbonate: 31.1 mmol/L — ABNORMAL HIGH (ref 20.0–28.0)
O2 Saturation: 66.4 %
Patient temperature: 98.6
pCO2, Ven: 68.5 mmHg — ABNORMAL HIGH (ref 44.0–60.0)
pH, Ven: 7.28 (ref 7.250–7.430)
pO2, Ven: 38.5 mmHg (ref 32.0–45.0)

## 2019-11-19 LAB — BASIC METABOLIC PANEL
Anion gap: 11 (ref 5–15)
BUN: 12 mg/dL (ref 8–23)
CO2: 29 mmol/L (ref 22–32)
Calcium: 8.5 mg/dL — ABNORMAL LOW (ref 8.9–10.3)
Chloride: 97 mmol/L — ABNORMAL LOW (ref 98–111)
Creatinine, Ser: 0.98 mg/dL (ref 0.44–1.00)
GFR calc Af Amer: >60 mL/min (ref >60)
GFR calc non Af Amer: >60 mL/min (ref >60)
Glucose, Bld: 116 mg/dL — ABNORMAL HIGH (ref 70–99)
Potassium: 3.6 mmol/L (ref 3.5–5.1)
Sodium: 137 mmol/L (ref 135–145)

## 2019-11-19 LAB — ETHANOL: Alcohol, Ethyl (B): <10 mg/dL (ref <10)

## 2019-11-19 LAB — CBC WITH DIFFERENTIAL/PLATELET
Abs Immature Granulocytes: 0.07 10*3/uL (ref 0.00–0.07)
Basophils Absolute: 0 10*3/uL (ref 0.0–0.1)
Basophils Relative: 0 %
Eosinophils Absolute: 0 10*3/uL (ref 0.0–0.5)
Eosinophils Relative: 0 %
HCT: 33 % — ABNORMAL LOW (ref 36.0–46.0)
Hemoglobin: 10 g/dL — ABNORMAL LOW (ref 12.0–15.0)
Immature Granulocytes: 1 %
Lymphocytes Relative: 9 %
Lymphs Abs: 1 10*3/uL (ref 0.7–4.0)
MCH: 27.3 pg (ref 26.0–34.0)
MCHC: 30.3 g/dL (ref 30.0–36.0)
MCV: 90.2 fL (ref 80.0–100.0)
Monocytes Absolute: 1.3 10*3/uL — ABNORMAL HIGH (ref 0.1–1.0)
Monocytes Relative: 12 %
Neutro Abs: 7.9 10*3/uL — ABNORMAL HIGH (ref 1.7–7.7)
Neutrophils Relative %: 78 %
Platelets: 168 10*3/uL (ref 150–400)
RBC: 3.66 MIL/uL — ABNORMAL LOW (ref 3.87–5.11)
RDW: 18 % — ABNORMAL HIGH (ref 11.5–15.5)
WBC: 10.3 10*3/uL (ref 4.0–10.5)
nRBC: 0 % (ref 0.0–0.2)

## 2019-11-19 LAB — D-DIMER, QUANTITATIVE: D-Dimer, Quant: 2.54 ug/mL-FEU — ABNORMAL HIGH (ref 0.00–0.50)

## 2019-11-19 LAB — HEPATIC FUNCTION PANEL
ALT: 25 U/L (ref 0–44)
AST: 42 U/L — ABNORMAL HIGH (ref 15–41)
Albumin: 3.4 g/dL — ABNORMAL LOW (ref 3.5–5.0)
Alkaline Phosphatase: 67 U/L (ref 38–126)
Bilirubin, Direct: <0.1 mg/dL (ref 0.0–0.2)
Total Bilirubin: 0.3 mg/dL (ref 0.3–1.2)
Total Protein: 6.9 g/dL (ref 6.5–8.1)

## 2019-11-19 LAB — AMMONIA: Ammonia: 27 umol/L (ref 9–35)

## 2019-11-19 LAB — C-REACTIVE PROTEIN: CRP: 10.3 mg/dL — ABNORMAL HIGH (ref <1.0)

## 2019-11-19 LAB — POC SARS CORONAVIRUS 2 AG -  ED: SARS Coronavirus 2 Ag: POSITIVE — AB

## 2019-11-19 LAB — FIBRINOGEN: Fibrinogen: 572 mg/dL — ABNORMAL HIGH (ref 210–475)

## 2019-11-19 LAB — PROCALCITONIN: Procalcitonin: 0.63 ng/mL

## 2019-11-19 LAB — LACTIC ACID, PLASMA: Lactic Acid, Venous: 0.8 mmol/L (ref 0.5–1.9)

## 2019-11-19 LAB — FERRITIN: Ferritin: 204 ng/mL (ref 11–307)

## 2019-11-19 LAB — LACTATE DEHYDROGENASE: LDH: 245 U/L — ABNORMAL HIGH (ref 98–192)

## 2019-11-19 MED ORDER — SODIUM CHLORIDE 0.9 % IV SOLN
200.0000 mg | Freq: Once | INTRAVENOUS | Status: AC
  Administered 2019-11-19: 200 mg via INTRAVENOUS
  Filled 2019-11-19: qty 200

## 2019-11-19 MED ORDER — ONDANSETRON HCL 4 MG/2ML IJ SOLN: 4.0000 mg | Freq: Four times a day (QID) | INTRAMUSCULAR | Status: DC | PRN

## 2019-11-19 MED ORDER — DEXAMETHASONE SODIUM PHOSPHATE 10 MG/ML IJ SOLN
6.0000 mg | INTRAMUSCULAR | Status: DC
  Administered 2019-11-19 – 2019-11-20 (×2): 6 mg via INTRAVENOUS
  Filled 2019-11-19 (×2): qty 1

## 2019-11-19 MED ORDER — SODIUM CHLORIDE 0.9 % IV SOLN
100.0000 mg | Freq: Every day | INTRAVENOUS | Status: DC
  Administered 2019-11-20 – 2019-11-21 (×2): 100 mg via INTRAVENOUS
  Filled 2019-11-19 (×2): qty 100

## 2019-11-19 MED ORDER — ONDANSETRON HCL 4 MG PO TABS: 4.0000 mg | ORAL_TABLET | Freq: Four times a day (QID) | ORAL | Status: DC | PRN

## 2019-11-19 MED ORDER — ACETAMINOPHEN 325 MG PO TABS
650.0000 mg | ORAL_TABLET | Freq: Four times a day (QID) | ORAL | Status: DC | PRN
  Administered 2019-11-20: 650 mg via ORAL
  Filled 2019-11-19: qty 2

## 2019-11-19 NOTE — Telephone Encounter (Signed)
  Radiation Oncology         [phone] ________________________________  Name: [name] MRN: [id number]  Date of Service: 11/19/2019  DOB: 05-04-1956  Post Treatment Telephone Note  Diagnosis:   Metastatic breast cancer now with large brain metastases.  Interval Since Last Radiation: 5 weeks   09/11/2019-10/16/2019:  The brain was treated to 30 Gy in 10 fractions.   Narrative:  The patient was contacted today for routine follow-up. During treatment she did very well with radiotherapy and did not have significant desquamation. She reports she has had some dry darkened areas over her scalp. Her daughter believes she has finished her dexamethasone taper as well.   Impression/Plan: 1.  Metastatic breast cancer now with large brain metastases. The patient has been doing well since completion of radiotherapy. We discussed that we would be happy to continue to follow her in about 2 months with a repeat MRI of the brain. She will continue with Dr. Jana Hakim as well. She was transferred up to speak with Mateo Flow about her appointment with him also.     Carola Rhine, PAC

## 2019-11-19 NOTE — Telephone Encounter (Signed)
This RN spoke with pt per transfer call from [place] provider -  [name] states she is " not doing good ".  She states she fell out of bed.  She states she is confused and has misplaced her pain medication as well as money.  She states she stopped taking the steroids " they made me feel bad "  She states she started the [place] last Monday (1/4)  This RN informed pt this office is concerned and feels she needs someone to see her- this RN offered for contact with Palliative nurse who can come to her home- look over her medications and help her daughters with understanding how to help care for her.  [name] declined above - stating " they're hospice - I don't want them "  " my daughters know how to take care of me "  This RN reiterated statements the patient has made in call and again stated need for pt to be seen by someone for assessment and written plan so her daughters know what to do.  This RN again asked what this office can do to help her- [name] stated she lost her pain medication.  This RN informed her - when a controlled substance is prescribed it is the patient's responsibility to keep up with the medication. Dr [name] did not write for her pain medication and he cannot write a prescription for a narcotic with her having a current fill.  She needs to have her daughters look for the medication she has misplaced.  This RN reiterated above is one reason why she needs someone to see her- not for pain medication refill but that she is confused and falling.  [name] agreed to visit - but declined appointment today with [facility] - she states she does not want to come today.  She states she only wants to see Dr [name].  This RN made her an appointment for tomorrow.  No further needs at this time.

## 2019-11-19 NOTE — H&P (Signed)
History and Physical    [name] [id number] DOB: 20-Oct-1956 DOA: 11/19/2019  PCP: Patient, No Pcp Per   Patient coming from: Home   Chief Complaint: Unresponsive   HPI: [name] is a 64 y.o. female with medical history significant for breast cancer with brain metastases, hypertension, chronic pain, presenting to the emergency department after she was found to be unresponsive at home.  Patient has been more confused and falling recently, has been contacting her physicians for refills on her pain medications, and was then found this evening to be unresponsive.  Pinpoint pupils were noted and Narcan administered by EMS prior to arrival in the ED.  Patient was saturating 82% on room air with EMS and was placed on supplemental oxygen.  Patient's daughter provides additional history, notes that the patient has been lethargic at times recently, was found on the floor this morning after the patient said she had fallen out of bed; patient's daughter was going to call EMS but the patient asked her not to.  Later this evening, patient's daughter heard what sounded like the patient falling, found her on the floor, was having difficulty waking her up, and called EMS.  She did not see any shaking or noticed any incontinence during this episode and reports that the patient seemed to wake up after Narcan was administered.  ED Course: Upon arrival to the ED, patient is found to be afebrile, saturating mid-90s on 4 L/min of supplemental oxygen, normal respiratory rate and heart rate, and stable blood pressure.  Chest x-ray is negative for acute cardiopulmonary disease.  Noncontrast head CT negative for acute intracranial hemorrhage but notable for the known right frontal and right parietal edema.  No acute cervical spine pathology noted on CT.  Chemistry panel with slight elevation in AST and CBC with normocytic anemia.  Ammonia level was normal, ethanol undetectable, lactic acid normal, and COVID-19 antigen  test positive.  Blood cultures were collected in the emergency department and hospitalists consulted for admission.  Review of Systems:  All other systems reviewed and apart from HPI, are negative.  Past Medical History:  Diagnosis Date  . Breast cancer ([place])   . DVT (deep venous thrombosis) ([place]) 10/07/2011  . Fever 02/06/2018  . Hypertension   . Radiation 11/29/2005-12/29/2005   right supraclavicular area 4147 cGy  . Radiation 06/26/02-08/14/02   right breast 5040 cGy, tumor bed boosted to 1260 cGy  . Rheumatic fever     Past Surgical History:  Procedure Laterality Date  . BREAST SURGERY    . MASTECTOMY Bilateral   . PORT A CATH REVISION       reports that she has been smoking. She has never used smokeless tobacco. She reports current alcohol use. She reports that she does not use drugs.  Allergies  Allergen Reactions  . Tramadol Nausea Only  . Hydrocodone-Acetaminophen Itching and Rash  . Pork-Derived Products Other (See Comments)    Pt says she just doesn't eat pork; has no reaction to pork products    Family History  Problem Relation Age of Onset  . Pancreatic cancer Mother   . Kidney cancer Sister 64  . Breast cancer Sister 83  . Breast cancer Other 33     Prior to Admission medications   Medication Sig Start Date End Date Taking? Authorizing Provider  Ascorbic Acid (VITAMIN C) 100 MG tablet Take 100 mg by mouth daily.      [provider]  azithromycin (ZITHROMAX Z-PAK) 250 MG tablet Take as  directed 10/29/19   [name], [name], MD  cholecalciferol (VITAMIN D) 1000 units tablet Take 1 tablet (1,000 Units total) by mouth daily. 05/16/18   [name], [name], MD  dexamethasone (DECADRON) 4 MG tablet Take 0.5 tablets (2 mg total) by mouth daily. 10/18/19   [name], [name], MD  gabapentin (NEURONTIN) 100 MG capsule Take 1 capsule (300 mg total) by mouth 2 (two) times daily as needed. 10/11/19   [name], [name], MD  hydrOXYzine (ATARAX/VISTARIL) 25 MG  tablet Take 1 tablet (25 mg total) by mouth 3 (three) times daily as needed. 10/18/19 01/16/20  [name], [name], MD  magic mouthwash SOLN SWISH AND SPIT OR SWALLOW WITH 5MLS BEFORE MEALS AND AT BEDTIME 09/25/19   [name], [name], MD  methadone (DOLOPHINE) 10 MG tablet Take 1 tablet (10 mg total) by mouth 3 (three) times daily. 10/18/19   [name], [name], MD  Multiple Vitamin (MULTIVITAMIN) capsule Take 1 capsule by mouth daily.      [provider]  naloxone [facility]) nasal spray 4 mg/0.1 mL Administer single spray of naloxone into one nostril. Additionally, spray can be given every 2-3 minutes as needed until emergency medical assistance arrives. Call 911 immediately after use. 10/25/19   [name], [name], MD  potassium chloride (MICRO-K) 10 MEQ CR capsule Take 2 capsules (20 mEq total) by mouth daily. 08/16/19   [name], [name], MD  prochlorperazine (COMPAZINE) 10 MG tablet Take 1 tablet (10 mg total) by mouth every 6 (six) hours as needed for nausea or vomiting. 10/17/19   Tanner, Lyndon Code., PA-C  sertraline (ZOLOFT) 100 MG tablet Take 1 tablet (100 mg total) by mouth daily. 10/11/19   Tanner, Lyndon Code., PA-C  triamterene-hydrochlorothiazide (DYAZIDE) 37.5-25 MG capsule Take 1 each (1 capsule total) by mouth every morning. 07/30/19   [name], [name], MD  tucatinib (TUKYSA) 150 MG tablet Take 2 tablets (300 mg total) by mouth every morning. Take as directed by MD. 10/25/19   [name], [name], MD    Physical Exam: Vitals:   11/19/19 1748 11/19/19 1803 11/19/19 1805  BP:  (!) 146/93   Pulse:  96   Resp:  13   Temp:  99.7 F (37.6 C)   TempSrc:  Oral   SpO2: 92% 94%   Weight:   86.3 kg  Height:   5\' 6"  (1.676 m)    Constitutional: no respiratory distress, lethargic  Eyes: PERTLA, lids and conjunctivae normal ENMT: Mucous membranes are moist. Posterior pharynx clear of any exudate or lesions.   Neck: normal, supple, no masses, no thyromegaly Respiratory: no wheezing, no  crackles. Normal respiratory effort. No accessory muscle use.  Cardiovascular: S1 & S2 heard, regular rate and rhythm. Mild LE edema bilaterally. Abdomen: Distended. Non-tender. Bowel sounds active.  Musculoskeletal: no clubbing / cyanosis. No joint deformity upper and lower extremities.  Skin: no significant rashes, lesions, ulcers. Warm, dry, well-perfused. Neurologic: No gross facial asymmetry. Sensation intact. Moving all extremities spontaneously. Lethargic, wakes to voice or tactile stimuli, makes brief eye contact before returning to sleep.    Labs on Admission: I have personally reviewed following labs and imaging studies  CBC: Recent Labs  Lab 11/19/19 1831  WBC 10.3  NEUTROABS 7.9*  HGB 10.0*  HCT 33.0*  MCV 90.2  PLT [id number]   Basic Metabolic Panel: Recent Labs  Lab 11/19/19 1831  NA 137  K 3.6  CL 97*  CO2 29  GLUCOSE 116*  BUN 12  CREATININE 0.98  CALCIUM 8.5*   GFR: Estimated Creatinine Clearance: 64.2 mL/min (by C-G formula based on SCr of 0.98 mg/dL). Liver Function Tests: Recent Labs  Lab 11/19/19 1831  AST 42*  ALT 25  ALKPHOS 67  BILITOT 0.3  PROT 6.9  ALBUMIN 3.4*   No results for input(s): LIPASE, AMYLASE in the last 168 hours. Recent Labs  Lab 11/19/19 1815  AMMONIA 27   Coagulation Profile: No results for input(s): INR, PROTIME in the last 168 hours. Cardiac Enzymes: No results for input(s): CKTOTAL, CKMB, CKMBINDEX, TROPONINI in the last 168 hours. BNP (last 3 results) No results for input(s): PROBNP in the last 8760 hours. HbA1C: No results for input(s): HGBA1C in the last 72 hours. CBG: No results for input(s): GLUCAP in the last 168 hours. Lipid Profile: No results for input(s): CHOL, HDL, LDLCALC, TRIG, CHOLHDL, LDLDIRECT in the last 72 hours. Thyroid Function Tests: No results for input(s): TSH, T4TOTAL, FREET4, T3FREE, THYROIDAB in the last 72 hours. Anemia Panel: No results for input(s): VITAMINB12, FOLATE, FERRITIN, TIBC,  IRON, RETICCTPCT in the last 72 hours. Urine analysis:    Component Value Date/Time   COLORURINE YELLOW 02/13/2018 [place] 02/13/2018 1355   LABSPEC 1.010 02/13/2018 1355   PHURINE 7.0 02/13/2018 1355   GLUCOSEU NEGATIVE 02/13/2018 1355   HGBUR NEGATIVE 02/13/2018 1355   BILIRUBINUR NEGATIVE 02/13/2018 1355   KETONESUR NEGATIVE 02/13/2018 1355   PROTEINUR NEGATIVE 02/13/2018 1355   UROBILINOGEN 0.2 12/29/2010 1427   NITRITE NEGATIVE 02/13/2018 1355   LEUKOCYTESUR NEGATIVE 02/13/2018 1355   Sepsis Labs: @LABRCNTIP (procalcitonin:4,lacticidven:4) )No results found for this or any previous visit (from the past 240 hour(s)).   Radiological Exams on Admission: CT Head Wo Contrast  Result Date: 11/19/2019 CLINICAL DATA:  64 year old female with head trauma. History of breast cancer with metastases to the brain. EXAM: CT HEAD WITHOUT CONTRAST CT CERVICAL SPINE WITHOUT CONTRAST TECHNIQUE: Multidetector CT imaging of the head and cervical spine was performed following the standard protocol without intravenous contrast. Multiplanar CT image reconstructions of the cervical spine were also generated. COMPARISON:  Brain MRI dated 09/04/2019 and CT dated 02/06/2018. FINDINGS: CT HEAD FINDINGS Brain: Is defined right frontal lobe mass measuring 18 x 14 mm. There is associated moderate surrounding vasogenic edema. There is edema also in the right temporal lobe likely secondary to underlying mass which is suboptimally seen on this noncontrast CT but in keeping with known history of antacids cyst. There is no mass effect or midline shift. The ventricles and sulci are otherwise appropriate size for patient's age. The gray-white matter discrimination is preserved. There is no acute intracranial hemorrhage. No extra-axial fluid collection. There is an empty sella turcica. Vascular: No hyperdense vessel or unexpected calcification. Skull: Normal. Negative for fracture or focal lesion.  Sinuses/Orbits: Mild mucoperiosteal thickening of paranasal sinuses. No air-fluid level. The mastoid air cells are clear. Other: None CT CERVICAL SPINE FINDINGS Alignment: No acute subluxation. There is straightening of normal cervical lordosis which may be positional or due to muscle spasm. Skull base and vertebrae: No acute fracture. Soft tissues and spinal canal: No prevertebral fluid or swelling. No visible canal hematoma. Disc levels:  Mild degenerative changes. Upper chest: Negative. Other: Partially visualized Port-A-Cath. IMPRESSION: 1. No acute intracranial hemorrhage. 2. Right frontal and right parietal edema as seen on the prior MRI and in keeping with known metastatic disease. 3. No acute/traumatic cervical spine pathology. Electronically Signed   By: Anner Crete [name]   On: 11/19/2019 19:31  CT Cervical Spine Wo Contrast  Result Date: 11/19/2019 CLINICAL DATA:  64 year old female with head trauma. History of breast cancer with metastases to the brain. EXAM: CT HEAD WITHOUT CONTRAST CT CERVICAL SPINE WITHOUT CONTRAST TECHNIQUE: Multidetector CT imaging of the head and cervical spine was performed following the standard protocol without intravenous contrast. Multiplanar CT image reconstructions of the cervical spine were also generated. COMPARISON:  Brain MRI dated 09/04/2019 and CT dated 02/06/2018. FINDINGS: CT HEAD FINDINGS Brain: Is defined right frontal lobe mass measuring 18 x 14 mm. There is associated moderate surrounding vasogenic edema. There is edema also in the right temporal lobe likely secondary to underlying mass which is suboptimally seen on this noncontrast CT but in keeping with known history of antacids cyst. There is no mass effect or midline shift. The ventricles and sulci are otherwise appropriate size for patient's age. The gray-white matter discrimination is preserved. There is no acute intracranial hemorrhage. No extra-axial fluid collection. There is an empty sella  turcica. Vascular: No hyperdense vessel or unexpected calcification. Skull: Normal. Negative for fracture or focal lesion. Sinuses/Orbits: Mild mucoperiosteal thickening of paranasal sinuses. No air-fluid level. The mastoid air cells are clear. Other: None CT CERVICAL SPINE FINDINGS Alignment: No acute subluxation. There is straightening of normal cervical lordosis which may be positional or due to muscle spasm. Skull base and vertebrae: No acute fracture. Soft tissues and spinal canal: No prevertebral fluid or swelling. No visible canal hematoma. Disc levels:  Mild degenerative changes. Upper chest: Negative. Other: Partially visualized Port-A-Cath. IMPRESSION: 1. No acute intracranial hemorrhage. 2. Right frontal and right parietal edema as seen on the prior MRI and in keeping with known metastatic disease. 3. No acute/traumatic cervical spine pathology. Electronically Signed   By: Anner Crete [name]   On: 11/19/2019 19:31   DG Chest Port 1 View  Result Date: 11/19/2019 CLINICAL DATA:  64 year old female with history of cough and hypoxia. Found unresponsive by family. Possible aspiration. EXAM: PORTABLE CHEST 1 VIEW COMPARISON:  Chest x-ray 10/11/2019. FINDINGS: Left internal jugular single-lumen power porta cath with tip terminating in the right atrium. Mild linear scarring in the left mid lung, unchanged. Lung volumes are low. No consolidative airspace disease. No pleural effusions. No pneumothorax. No pulmonary nodule or mass noted. Pulmonary vasculature and the cardiomediastinal silhouette are within normal limits. Aortic atherosclerosis. Surgical clips in the region of the right axilla, presumably from prior lymph node dissection. IMPRESSION: 1. No radiographic evidence of acute cardiopulmonary disease. 2. Aortic atherosclerosis. Electronically Signed   By: Vinnie Langton [name]   On: 11/19/2019 18:41    EKG: Independently reviewed. Sinus rhythm.   Assessment/Plan   1. Acute hypoxic respiratory  failure; COVID-19  - Presents after being found unresponsive at home, found to have new 4 Lpm supplemental O2 requirement with clear CXR and COVID-19 positive  - Likely secondary to COVID-19 though aspiration also considered given the clinical scenario  - Start Decadron and remdesivir, continue supplemental O2 as needed, trend markers    2. Acute encephalopathy; opiate overdose  - Patient was found unresponsive at home, reportedly woke with Narcan given by EMS and her daughter reports prior unintentional opiate overdoses and suspects the same now  - No acute findings on head CT in ED, ammonia wnl, likely secondary to opiates though with brain mets seizures also considered  - She is lethargic in ED, wakes to voice and makes eye contact but falls back asleep  - Continue neuro checks, EEG in am, hold  narcotics and sedating medications    3. Breast cancer with metastases  - Follows with oncology, has known brain metastases treated with radiation, has been on Tukysa and steroids, pharmacy medication-reconciliation currently pending   4. Hypertension  - BP at goal    5. Chronic pain  - Hold narcotics initially, continue non-narcotic analgesia    6. Normocytic anemia  - Hgb is 10.0 on admission, similar to most prior values but with exception of one month ago when it was 14.5  - No bleeding evident, will repeat cbc tomorrow    DVT prophylaxis: Lovenox  Code Status: Full; patient disoriented on admission, discussed with family   Family Communication: Husband and daughter Judith Part) updated by phone  Consults called: None  Admission status: Inpatient     Vianne Bulls, MD [facility] Pager [phone]  If 7PM-7AM, please contact night-coverage [email or web address] Password [facility]  11/19/2019, 8:44 PM

## 2019-11-19 NOTE — ED Provider Notes (Signed)
[facility] DEPT Provider Note   CSN: [id number] Arrival date & time: 11/19/19  1737     History Chief Complaint  Patient presents with  . Loss of Consciousness  . Aspiration    [name] is a 64 y.o. female.  64 yo F with chief complaint of decreased responsiveness.  This is been an ongoing issue and there were multiple phone calls made to her oncologist and radiation oncologist to try and establish a visit.  She also has been complaining of significant head and neck pain.  States that she is run out of her home medications.  She was picked up by EMS and noted to have a significantly diminished mental status as well as bradypnea and pinpoint pupils.  She was given Narcan and is now back to her baseline.  Patient states that she has been having some issues with her cancer.  States that she was treated really well in [place] but does not think that her chemotherapy here has been helping.  She also stopped taking her steroids because she said that was not helping her pain.  After Narcan EMS thought that the patient may have aspirated.  Oxygen saturation in the mid 80s requiring nasal cannula.  Not on oxygen at home.  Patient was coughing while I am in the room but denies history of cough.  The history is provided by the patient.  Loss of Consciousness Associated symptoms: headaches   Associated symptoms: no chest pain, no dizziness, no fever, no nausea, no palpitations, no shortness of breath and no vomiting   Illness Severity:  Moderate Onset quality:  Gradual Duration:  2 days Timing:  Constant Progression:  Worsening Chronicity:  New Associated symptoms: headaches   Associated symptoms: no chest pain, no congestion, no fever, no myalgias, no nausea, no rhinorrhea, no shortness of breath, no vomiting and no wheezing        Past Medical History:  Diagnosis Date  . Breast cancer ([place])   . DVT (deep venous thrombosis) ([place]) 10/07/2011  . Fever  02/06/2018  . Hypertension   . Radiation 11/29/2005-12/29/2005   right supraclavicular area 4147 cGy  . Radiation 06/26/02-08/14/02   right breast 5040 cGy, tumor bed boosted to 1260 cGy  . Rheumatic fever     Patient Active Problem List   Diagnosis Date Noted  . Acute respiratory failure with hypoxia ([place]) 11/19/2019  . COVID-19 virus infection 11/19/2019  . Metastasis to brain ([place]) 09/13/2019  . Acute lower UTI 02/06/2018  . Altered mental status 02/06/2018  . Bacteremia 02/06/2018  . Polypharmacy 02/06/2018  . Goals of care, counseling/discussion 09/05/2017  . Pneumonia 01/25/2017  . CAP (community acquired pneumonia) 01/23/2017  . Port catheter in place 05/30/2016  . 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 Chronic pain 04/13/2015  . Left arm pain 08/18/2014  . Malignant neoplasm of upper-inner quadrant of right breast in female, estrogen receptor positive ([place]) 10/03/2013  . Post-lymphadenectomy lymphedema of arm 08/15/2013  . Port or reservoir infection 01/29/2013  . DVT (deep venous thrombosis) ([place]) 10/07/2011  . Chest pain 09/10/2009  . Secondary cardiomyopathy ([place]) 09/02/2009  . Essential hypertension 02/26/2009  . GERD 02/26/2009    Past Surgical History:  Procedure Laterality Date  . BREAST SURGERY    . MASTECTOMY Bilateral   . PORT A CATH REVISION       OB History   No obstetric history on file.     Family History  Problem Relation  Age of Onset  . Pancreatic cancer Mother   . Kidney cancer Sister 52  . Breast cancer Sister 62  . Breast cancer Other 77    Social History   Tobacco Use  . Smoking status: Light Tobacco Smoker  . Smokeless tobacco: Never Used  Substance Use Topics  . Alcohol use: Yes    Comment: Rarely  . Drug use: No    Home Medications Prior to Admission medications   Medication Sig Start Date End Date Taking? Authorizing Provider  Ascorbic Acid (VITAMIN C) 100 MG tablet Take 100 mg by mouth daily.       [provider]  azithromycin (ZITHROMAX Z-PAK) 250 MG tablet Take as directed 10/29/19   [name], [name], MD  cholecalciferol (VITAMIN D) 1000 units tablet Take 1 tablet (1,000 Units total) by mouth daily. 05/16/18   [name], [name], MD  dexamethasone (DECADRON) 4 MG tablet Take 0.5 tablets (2 mg total) by mouth daily. 10/18/19   [name], [name], MD  gabapentin (NEURONTIN) 100 MG capsule Take 1 capsule (300 mg total) by mouth 2 (two) times daily as needed. 10/11/19   [name], [name], MD  hydrOXYzine (ATARAX/VISTARIL) 25 MG tablet Take 1 tablet (25 mg total) by mouth 3 (three) times daily as needed. 10/18/19 01/16/20  [name], [name], MD  magic mouthwash SOLN SWISH AND SPIT OR SWALLOW WITH 5MLS BEFORE MEALS AND AT BEDTIME 09/25/19   [name], [name], MD  methadone (DOLOPHINE) 10 MG tablet Take 1 tablet (10 mg total) by mouth 3 (three) times daily. 10/18/19   [name], [name], MD  Multiple Vitamin (MULTIVITAMIN) capsule Take 1 capsule by mouth daily.      [provider]  naloxone [facility]) nasal spray 4 mg/0.1 mL Administer single spray of naloxone into one nostril. Additionally, spray can be given every 2-3 minutes as needed until emergency medical assistance arrives. Call 911 immediately after use. 10/25/19   [name], [name], MD  potassium chloride (MICRO-K) 10 MEQ CR capsule Take 2 capsules (20 mEq total) by mouth daily. 08/16/19   [name], [name], MD  prochlorperazine (COMPAZINE) 10 MG tablet Take 1 tablet (10 mg total) by mouth every 6 (six) hours as needed for nausea or vomiting. 10/17/19   Tanner, Lyndon Code., PA-C  sertraline (ZOLOFT) 100 MG tablet Take 1 tablet (100 mg total) by mouth daily. 10/11/19   Tanner, Lyndon Code., PA-C  triamterene-hydrochlorothiazide (DYAZIDE) 37.5-25 MG capsule Take 1 each (1 capsule total) by mouth every morning. 07/30/19   [name], [name], MD  tucatinib (TUKYSA) 150 MG tablet Take 2 tablets (300 mg total) by mouth every morning.  Take as directed by MD. 10/25/19   [name], [name], MD    Allergies    Tramadol, Hydrocodone-acetaminophen, and Pork-derived products  Review of Systems   Review of Systems  Constitutional: Positive for activity change. Negative for chills and fever.  HENT: Negative for congestion and rhinorrhea.   Eyes: Negative for redness and visual disturbance.  Respiratory: Negative for shortness of breath and wheezing.   Cardiovascular: Positive for syncope. Negative for chest pain and palpitations.  Gastrointestinal: Negative for nausea and vomiting.  Genitourinary: Negative for dysuria and urgency.  Musculoskeletal: Positive for neck pain. Negative for arthralgias and myalgias.  Skin: Negative for pallor and wound.  Neurological: Positive for headaches. Negative for dizziness.    Physical Exam Updated Vital Signs BP (!) 146/93 (BP Location: Left Arm)   Pulse 96   Temp 99.7 F (37.6  C) (Oral)   Resp 13   Ht 5\' 6"  (1.676 m)   Wt 86.3 kg   SpO2 94%   BMI 30.71 kg/m   Physical Exam Vitals and nursing note reviewed.  Constitutional:      General: She is not in acute distress.    Appearance: She is well-developed. She is not diaphoretic.     Comments: Clinically intoxicated  HENT:     Head: Normocephalic and atraumatic.  Eyes:     Pupils: Pupils are equal, round, and reactive to light.  Cardiovascular:     Rate and Rhythm: Normal rate and regular rhythm.     Heart sounds: No murmur. No friction rub. No gallop.   Pulmonary:     Effort: Pulmonary effort is normal.     Breath sounds: No wheezing or rales.  Abdominal:     General: There is no distension.     Palpations: Abdomen is soft.     Tenderness: There is no abdominal tenderness.  Musculoskeletal:        General: No tenderness.     Cervical back: Normal range of motion and neck supple.  Skin:    General: Skin is warm and dry.  Neurological:     Mental Status: She is alert and oriented to person, place, and time.    Psychiatric:        Behavior: Behavior normal.     ED Results / Procedures / Treatments   Labs (all labs ordered are listed, but only abnormal results are displayed) Labs Reviewed  CBC WITH DIFFERENTIAL/PLATELET - Abnormal; Notable for the following components:      Result Value   RBC 3.66 (*)    Hemoglobin 10.0 (*)    HCT 33.0 (*)    RDW 18.0 (*)    Neutro Abs 7.9 (*)    Monocytes Absolute 1.3 (*)    All other components within normal limits  BASIC METABOLIC PANEL - Abnormal; Notable for the following components:   Chloride 97 (*)    Glucose, Bld 116 (*)    Calcium 8.5 (*)    All other components within normal limits  HEPATIC FUNCTION PANEL - Abnormal; Notable for the following components:   Albumin 3.4 (*)    AST 42 (*)    All other components within normal limits  BLOOD GAS, VENOUS - Abnormal; Notable for the following components:   pCO2, Ven 68.5 (*)    Bicarbonate 31.1 (*)    Acid-Base Excess 3.5 (*)    All other components within normal limits  POC SARS CORONAVIRUS 2 AG -  ED - Abnormal; Notable for the following components:   SARS Coronavirus 2 Ag POSITIVE (*)    All other components within normal limits  CULTURE, BLOOD (ROUTINE X 2)  CULTURE, BLOOD (ROUTINE X 2)  LACTIC ACID, PLASMA  ETHANOL  AMMONIA  URINALYSIS, ROUTINE W REFLEX MICROSCOPIC    EKG None  Radiology CT Head Wo Contrast  Result Date: 11/19/2019 CLINICAL DATA:  64 year old female with head trauma. History of breast cancer with metastases to the brain. EXAM: CT HEAD WITHOUT CONTRAST CT CERVICAL SPINE WITHOUT CONTRAST TECHNIQUE: Multidetector CT imaging of the head and cervical spine was performed following the standard protocol without intravenous contrast. Multiplanar CT image reconstructions of the cervical spine were also generated. COMPARISON:  Brain MRI dated 09/04/2019 and CT dated 02/06/2018. FINDINGS: CT HEAD FINDINGS Brain: Is defined right frontal lobe mass measuring 18 x 14 mm. There  is associated moderate surrounding  vasogenic edema. There is edema also in the right temporal lobe likely secondary to underlying mass which is suboptimally seen on this noncontrast CT but in keeping with known history of antacids cyst. There is no mass effect or midline shift. The ventricles and sulci are otherwise appropriate size for patient's age. The gray-white matter discrimination is preserved. There is no acute intracranial hemorrhage. No extra-axial fluid collection. There is an empty sella turcica. Vascular: No hyperdense vessel or unexpected calcification. Skull: Normal. Negative for fracture or focal lesion. Sinuses/Orbits: Mild mucoperiosteal thickening of paranasal sinuses. No air-fluid level. The mastoid air cells are clear. Other: None CT CERVICAL SPINE FINDINGS Alignment: No acute subluxation. There is straightening of normal cervical lordosis which may be positional or due to muscle spasm. Skull base and vertebrae: No acute fracture. Soft tissues and spinal canal: No prevertebral fluid or swelling. No visible canal hematoma. Disc levels:  Mild degenerative changes. Upper chest: Negative. Other: Partially visualized Port-A-Cath. IMPRESSION: 1. No acute intracranial hemorrhage. 2. Right frontal and right parietal edema as seen on the prior MRI and in keeping with known metastatic disease. 3. No acute/traumatic cervical spine pathology. Electronically Signed   By: Anner Crete [name]   On: 11/19/2019 19:31   CT Cervical Spine Wo Contrast  Result Date: 11/19/2019 CLINICAL DATA:  65 year old female with head trauma. History of breast cancer with metastases to the brain. EXAM: CT HEAD WITHOUT CONTRAST CT CERVICAL SPINE WITHOUT CONTRAST TECHNIQUE: Multidetector CT imaging of the head and cervical spine was performed following the standard protocol without intravenous contrast. Multiplanar CT image reconstructions of the cervical spine were also generated. COMPARISON:  Brain MRI dated 09/04/2019 and  CT dated 02/06/2018. FINDINGS: CT HEAD FINDINGS Brain: Is defined right frontal lobe mass measuring 18 x 14 mm. There is associated moderate surrounding vasogenic edema. There is edema also in the right temporal lobe likely secondary to underlying mass which is suboptimally seen on this noncontrast CT but in keeping with known history of antacids cyst. There is no mass effect or midline shift. The ventricles and sulci are otherwise appropriate size for patient's age. The gray-white matter discrimination is preserved. There is no acute intracranial hemorrhage. No extra-axial fluid collection. There is an empty sella turcica. Vascular: No hyperdense vessel or unexpected calcification. Skull: Normal. Negative for fracture or focal lesion. Sinuses/Orbits: Mild mucoperiosteal thickening of paranasal sinuses. No air-fluid level. The mastoid air cells are clear. Other: None CT CERVICAL SPINE FINDINGS Alignment: No acute subluxation. There is straightening of normal cervical lordosis which may be positional or due to muscle spasm. Skull base and vertebrae: No acute fracture. Soft tissues and spinal canal: No prevertebral fluid or swelling. No visible canal hematoma. Disc levels:  Mild degenerative changes. Upper chest: Negative. Other: Partially visualized Port-A-Cath. IMPRESSION: 1. No acute intracranial hemorrhage. 2. Right frontal and right parietal edema as seen on the prior MRI and in keeping with known metastatic disease. 3. No acute/traumatic cervical spine pathology. Electronically Signed   By: Anner Crete [name]   On: 11/19/2019 19:31   DG Chest Port 1 View  Result Date: 11/19/2019 CLINICAL DATA:  64 year old female with history of cough and hypoxia. Found unresponsive by family. Possible aspiration. EXAM: PORTABLE CHEST 1 VIEW COMPARISON:  Chest x-ray 10/11/2019. FINDINGS: Left internal jugular single-lumen power porta cath with tip terminating in the right atrium. Mild linear scarring in the left mid lung,  unchanged. Lung volumes are low. No consolidative airspace disease. No pleural effusions. No pneumothorax. No pulmonary  nodule or mass noted. Pulmonary vasculature and the cardiomediastinal silhouette are within normal limits. Aortic atherosclerosis. Surgical clips in the region of the right axilla, presumably from prior lymph node dissection. IMPRESSION: 1. No radiographic evidence of acute cardiopulmonary disease. 2. Aortic atherosclerosis. Electronically Signed   By: Vinnie Langton [name]   On: 11/19/2019 18:41    Procedures Procedures (including critical care time)  Medications Ordered in ED Medications - No data to display  ED Course  I have reviewed the triage vital signs and the nursing notes.  Pertinent labs & imaging results that were available during my care of the patient were reviewed by me and considered in my medical decision making (see chart for details).    MDM Rules/Calculators/A&P                      64 yo F with a chief complaint of decreased mental status.  Going on for the past few days.  Corresponding with losing her narcotic medications.  Patient required Narcan and is now back to her baseline.  History seems more consistent with the patient taking too much of her pain medication at home.  The patient also had aspirated with EMS.  Requiring oxygen.  Will obtain lab work CT of the head UA reassess.  CT head without significant change of her previous edema from mets.  Lab work also without significant change from baseline.  She is Covid positive.  This could explain her hypoxemia.  Will admit.    CRITICAL CARE Performed by: Cecilio Asper   Total critical care time: 35 minutes  Critical care time was exclusive of separately billable procedures and treating other patients.  Critical care was necessary to treat or prevent imminent or life-threatening deterioration.  Critical care was time spent personally by me on the following activities: development of  treatment plan with patient and/or surrogate as well as nursing, discussions with consultants, evaluation of patient's response to treatment, examination of patient, obtaining history from patient or surrogate, ordering and performing treatments and interventions, ordering and review of laboratory studies, ordering and review of radiographic studies, pulse oximetry and re-evaluation of patient's condition. Final Clinical Impression(s) / ED Diagnoses Final diagnoses:  COVID-19 virus infection    Rx / DC Orders ED Discharge Orders    None       Deno Etienne, DO 11/19/19 2036

## 2019-11-19 NOTE — Telephone Encounter (Signed)
The patient called back and stated that since our call earlier today. She reports she fell out of the bed and has fallen and though she didn't pass out. She reports her daughters have found her with saliva all over her. She didn't mention this earlier in our call. She also requests more pain medication as she thinks radiation to her brain has caused her neck and arm pain. I reminded her that that would be unusual for radiation do to this and that she's been on pain medication under the care of Dr. Jana Hakim for longer than she's been known to have brain disease. She describes falling when moving from a seated to a standing position, and I let her know she needs to be evaluated. It could be her BP, but her confusion about how to take her steroid taper and her belief that the steroids were making her have headaches, led to her discontinuing this. Since she's admitting to these symptoms, I think she needs further work up including an MRI of the brain, but to rule out more urgent medical issues like stroke or heart problems that could be contributing, I recommended she be evaluated in the ED setting. She would likely need an MRI of the brain too to rule out rebound edema in the brain. She declined until she spoke with Dr. Virgie [name] nurse [name]. I called Val and she agreed with what I recommended and I then transferred [name] to [employer] so they could decide how to proceed. Unfortunately her mental status is not known to me prior to her brain disease, but I am concerned that there could be more significant medical issues going on, and that she may need more assistance in managing her care as she seems unable to self manage. Her daughters are at home with her but when they come to the phone, they aren't willing to shed light on what's happening at home.

## 2019-11-19 NOTE — ED Triage Notes (Signed)
Patient arrived by EMS from home. Pt was found unresponsive by family. Pt had pinpoint pupils. Firefighters gave pt Narcan.   EMS reports possible aspiration during unresponsiveness this morning. Patient also had N/V prior to becoming unresponsive.   Pt became alert and is alert and oriented x 4 per EMS.   Patient has brain tumors and back tumors.   Pt was 82% SPO2 on RA. EMS placed patient on 4L. Pt SPOT is now 96%.

## 2019-11-20 ENCOUNTER — Inpatient Hospital Stay: Payer: Medicaid Other | Admitting: Oncology

## 2019-11-20 ENCOUNTER — Inpatient Hospital Stay: Payer: Medicaid Other

## 2019-11-20 ENCOUNTER — Other Ambulatory Visit: Payer: Self-pay | Admitting: Oncology

## 2019-11-20 DIAGNOSIS — I10 Essential (primary) hypertension: Secondary | ICD-10-CM

## 2019-11-20 DIAGNOSIS — Z79891 Long term (current) use of opiate analgesic: Secondary | ICD-10-CM

## 2019-11-20 DIAGNOSIS — R748 Abnormal levels of other serum enzymes: Secondary | ICD-10-CM

## 2019-11-20 DIAGNOSIS — J9601 Acute respiratory failure with hypoxia: Secondary | ICD-10-CM

## 2019-11-20 DIAGNOSIS — C50911 Malignant neoplasm of unspecified site of right female breast: Secondary | ICD-10-CM

## 2019-11-20 DIAGNOSIS — T40601A Poisoning by unspecified narcotics, accidental (unintentional), initial encounter: Principal | ICD-10-CM

## 2019-11-20 DIAGNOSIS — C4911 Malignant neoplasm of connective and soft tissue of right upper limb, including shoulder: Secondary | ICD-10-CM

## 2019-11-20 LAB — COMPREHENSIVE METABOLIC PANEL
ALT: 24 U/L (ref 0–44)
AST: 38 U/L (ref 15–41)
Albumin: 3.2 g/dL — ABNORMAL LOW (ref 3.5–5.0)
Alkaline Phosphatase: 61 U/L (ref 38–126)
Anion gap: 10 (ref 5–15)
BUN: 10 mg/dL (ref 8–23)
CO2: 29 mmol/L (ref 22–32)
Calcium: 8.4 mg/dL — ABNORMAL LOW (ref 8.9–10.3)
Chloride: 100 mmol/L (ref 98–111)
Creatinine, Ser: 0.9 mg/dL (ref 0.44–1.00)
GFR calc Af Amer: >60 mL/min (ref >60)
GFR calc non Af Amer: >60 mL/min (ref >60)
Glucose, Bld: 127 mg/dL — ABNORMAL HIGH (ref 70–99)
Potassium: 4.1 mmol/L (ref 3.5–5.1)
Sodium: 139 mmol/L (ref 135–145)
Total Bilirubin: 0.6 mg/dL (ref 0.3–1.2)
Total Protein: 6.6 g/dL (ref 6.5–8.1)

## 2019-11-20 LAB — D-DIMER, QUANTITATIVE: D-Dimer, Quant: 2.69 ug/mL-FEU — ABNORMAL HIGH (ref 0.00–0.50)

## 2019-11-20 LAB — CBC WITH DIFFERENTIAL/PLATELET
Abs Immature Granulocytes: 0.1 10*3/uL — ABNORMAL HIGH (ref 0.00–0.07)
Basophils Absolute: 0 10*3/uL (ref 0.0–0.1)
Basophils Relative: 0 %
Eosinophils Absolute: 0 10*3/uL (ref 0.0–0.5)
Eosinophils Relative: 0 %
HCT: 31.8 % — ABNORMAL LOW (ref 36.0–46.0)
Hemoglobin: 9.7 g/dL — ABNORMAL LOW (ref 12.0–15.0)
Immature Granulocytes: 1 %
Lymphocytes Relative: 5 %
Lymphs Abs: 0.6 10*3/uL — ABNORMAL LOW (ref 0.7–4.0)
MCH: 27.2 pg (ref 26.0–34.0)
MCHC: 30.5 g/dL (ref 30.0–36.0)
MCV: 89.3 fL (ref 80.0–100.0)
Monocytes Absolute: 0.5 10*3/uL (ref 0.1–1.0)
Monocytes Relative: 4 %
Neutro Abs: 12.1 10*3/uL — ABNORMAL HIGH (ref 1.7–7.7)
Neutrophils Relative %: 90 %
Platelets: 163 10*3/uL (ref 150–400)
RBC: 3.56 MIL/uL — ABNORMAL LOW (ref 3.87–5.11)
RDW: 17.9 % — ABNORMAL HIGH (ref 11.5–15.5)
WBC: 13.3 10*3/uL — ABNORMAL HIGH (ref 4.0–10.5)
nRBC: 0 % (ref 0.0–0.2)

## 2019-11-20 LAB — FERRITIN: Ferritin: 215 ng/mL (ref 11–307)

## 2019-11-20 LAB — HEPATITIS B SURFACE ANTIGEN: Hepatitis B Surface Ag: NONREACTIVE

## 2019-11-20 LAB — HIV ANTIBODY (ROUTINE TESTING W REFLEX): HIV Screen 4th Generation wRfx: NONREACTIVE

## 2019-11-20 LAB — C-REACTIVE PROTEIN: CRP: 12.9 mg/dL — ABNORMAL HIGH (ref <1.0)

## 2019-11-20 LAB — MRSA PCR SCREENING: MRSA by PCR: POSITIVE — AB

## 2019-11-20 LAB — ABO/RH: ABO/RH(D): B POS

## 2019-11-20 LAB — MAGNESIUM: Magnesium: 1.6 mg/dL — ABNORMAL LOW (ref 1.7–2.4)

## 2019-11-20 MED ORDER — MUPIROCIN 2 % EX OINT
1.0000 "application " | TOPICAL_OINTMENT | Freq: Two times a day (BID) | CUTANEOUS | Status: DC
  Administered 2019-11-20 – 2019-11-21 (×3): 1 via NASAL
  Filled 2019-11-20: qty 22

## 2019-11-20 MED ORDER — SODIUM CHLORIDE 0.9% FLUSH
3.0000 mL | Freq: Two times a day (BID) | INTRAVENOUS | Status: DC
  Administered 2019-11-20 – 2019-11-21 (×4): 3 mL via INTRAVENOUS

## 2019-11-20 MED ORDER — OXYCODONE HCL 5 MG PO TABS
10.0000 mg | ORAL_TABLET | Freq: Three times a day (TID) | ORAL | Status: DC | PRN
  Administered 2019-11-20 – 2019-11-21 (×3): 10 mg via ORAL
  Filled 2019-11-20 (×3): qty 2

## 2019-11-20 MED ORDER — SODIUM CHLORIDE 0.9% FLUSH
3.0000 mL | Freq: Two times a day (BID) | INTRAVENOUS | Status: DC
  Administered 2019-11-20 (×3): 3 mL via INTRAVENOUS

## 2019-11-20 MED ORDER — SODIUM CHLORIDE 0.9 % IV SOLN: 100.0000 mg | Freq: Every day | INTRAVENOUS | Status: DC

## 2019-11-20 MED ORDER — CHLORHEXIDINE GLUCONATE CLOTH 2 % EX PADS: 6.0000 | MEDICATED_PAD | Freq: Every day | CUTANEOUS | Status: DC

## 2019-11-20 MED ORDER — SENNOSIDES-DOCUSATE SODIUM 8.6-50 MG PO TABS: 1.0000 | ORAL_TABLET | Freq: Two times a day (BID) | ORAL | Status: DC | PRN

## 2019-11-20 MED ORDER — DM-GUAIFENESIN ER 30-600 MG PO TB12
1.0000 | ORAL_TABLET | Freq: Two times a day (BID) | ORAL | Status: DC
  Administered 2019-11-20 – 2019-11-21 (×3): 1 via ORAL
  Filled 2019-11-20 (×3): qty 1

## 2019-11-20 MED ORDER — ALBUTEROL SULFATE HFA 108 (90 BASE) MCG/ACT IN AERS: 2.0000 | INHALATION_SPRAY | Freq: Four times a day (QID) | RESPIRATORY_TRACT | Status: DC | PRN

## 2019-11-20 MED ORDER — MAGNESIUM SULFATE 2 GM/50ML IV SOLN
2.0000 g | Freq: Once | INTRAVENOUS | Status: AC
  Administered 2019-11-20: 2 g via INTRAVENOUS
  Filled 2019-11-20: qty 50

## 2019-11-20 MED ORDER — CHLORHEXIDINE GLUCONATE CLOTH 2 % EX PADS
6.0000 | MEDICATED_PAD | Freq: Every day | CUTANEOUS | Status: DC
  Administered 2019-11-20 – 2019-11-21 (×2): 6 via TOPICAL

## 2019-11-20 MED ORDER — TRIAMTERENE-HCTZ 37.5-25 MG PO CAPS
1.0000 | ORAL_CAPSULE | Freq: Every morning | ORAL | Status: DC
  Administered 2019-11-20 – 2019-11-21 (×2): 1 via ORAL
  Filled 2019-11-20 (×3): qty 1

## 2019-11-20 MED ORDER — ORAL CARE MOUTH RINSE
15.0000 mL | Freq: Two times a day (BID) | OROMUCOSAL | Status: DC
  Administered 2019-11-20 – 2019-11-21 (×3): 15 mL via OROMUCOSAL

## 2019-11-20 MED ORDER — ENOXAPARIN SODIUM 40 MG/0.4ML ~~LOC~~ SOLN
40.0000 mg | Freq: Two times a day (BID) | SUBCUTANEOUS | Status: DC
  Administered 2019-11-20 – 2019-11-21 (×3): 40 mg via SUBCUTANEOUS
  Filled 2019-11-20 (×3): qty 0.4

## 2019-11-20 MED ORDER — SERTRALINE HCL 100 MG PO TABS
100.0000 mg | ORAL_TABLET | Freq: Every day | ORAL | Status: DC
  Administered 2019-11-20 – 2019-11-21 (×2): 100 mg via ORAL
  Filled 2019-11-20 (×2): qty 1

## 2019-11-20 MED ORDER — METHADONE HCL 10 MG PO TABS
10.0000 mg | ORAL_TABLET | Freq: Every day | ORAL | Status: DC
  Administered 2019-11-20 – 2019-11-21 (×2): 10 mg via ORAL
  Filled 2019-11-20 (×2): qty 1

## 2019-11-20 MED ORDER — SODIUM CHLORIDE 0.9 % IV SOLN: 200.0000 mg | Freq: Once | INTRAVENOUS | Status: DC

## 2019-11-20 MED ORDER — GABAPENTIN 300 MG PO CAPS
300.0000 mg | ORAL_CAPSULE | Freq: Three times a day (TID) | ORAL | Status: DC
  Administered 2019-11-20 – 2019-11-21 (×4): 300 mg via ORAL
  Filled 2019-11-20 (×4): qty 1

## 2019-11-20 MED ORDER — SODIUM CHLORIDE 0.9% FLUSH: 3.0000 mL | INTRAVENOUS | Status: DC | PRN

## 2019-11-20 MED ORDER — ALPRAZOLAM 0.5 MG PO TABS
0.5000 mg | ORAL_TABLET | Freq: Every evening | ORAL | Status: DC | PRN
  Administered 2019-11-20: 0.5 mg via ORAL
  Filled 2019-11-20: qty 1

## 2019-11-20 MED ORDER — POLYETHYLENE GLYCOL 3350 17 G PO PACK: 17.0000 g | PACK | Freq: Every day | ORAL | Status: DC | PRN

## 2019-11-20 MED ORDER — SODIUM CHLORIDE 0.9 % IV SOLN: 250.0000 mL | INTRAVENOUS | Status: DC | PRN

## 2019-11-20 NOTE — Progress Notes (Signed)
Patient to transfer to [facility] report given to receiving nurse Janett Billow, all questions answered at this time.  Pt. VSS with no s/s of distress noted.  Patient stable at transfer.  Patients belongings sent with patient including cell phone and charger.

## 2019-11-20 NOTE — Progress Notes (Signed)
PROGRESS NOTE  [name] [id number] DOB: 10-20-1956   PCP: Patient, No Pcp Per  Patient is from: Home.  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  Was saturating at 82% when EMS arrived.  Received Narcan with improvement in her mental status.  In ED, febrile to 103.  Hemodynamically stable.  Saturating in mid 90s on 4 L by [place].  ABG with acute on chronic respiratory acidosis.  AST 42.    Hgb 10.  Otherwise, CBC and CMP without significant finding.  EtOH level negative. Ammonia 27. COVID-19 positive.  CRP 10.3.  Other inflammatory markers elevated as well.  CXR negative.  KUB with distal borders.  EKG without significant finding.  CT head negative for acute finding but known right frontal and right parietal edema.  Admitted for acute respiratory failure, encephalopathy and COVID-19 infection.  Started on Decadron and remdesivir.  Subjective: No major events overnight of this morning.  Likes to get out of the bed.  No other complaints.  Denies chest pain, dyspnea, cough, GI or UTI symptoms other than intermittent mild abdominal pain.  She understand that she was brought because she was sleepy and could not wake up.  Denies intentional overdose.  Objective: Vitals:   11/20/19 0700 11/20/19 0800 11/20/19 0900 11/20/19 1000  BP: (!) 115/54 (!) 143/76 138/63 (!) 135/59  Pulse: 67 69 97 69  Resp: 17 17 12 12   Temp: 97.7 F (36.5 C)     TempSrc: Oral     SpO2: 95% 100% 100% 97%  Weight:      Height:        Intake/Output Summary (Last 24 hours) at 11/20/2019 1113 Last data filed at 11/20/2019 1032 Gross per 24 hour    Intake 150 ml  Output 1250 ml  Net -1100 ml   Filed Weights   11/19/19 1805  Weight: 86.3 kg    Examination:  GENERAL: No acute distress.  Appears well.  HEENT: MMM.  Vision and hearing grossly intact.  NECK: Supple.  No nuchal rigidity. RESP: 92 to 94% on RA.  No IWOB.  Fair aeration bilaterally. CVS:  RRR. Heart sounds normal.  ABD/GI/GU: Bowel sounds present. Soft. Non tender.  MSK/EXT:  Moves extremities. No apparent deformity. No edema.  SKIN: no apparent skin lesion or wound NEURO: Awake, alert and oriented appropriately.  No apparent focal neuro deficit. PSYCH: Calm. Normal affect.   Procedures:  None  Assessment & Plan: Acute toxic metabolic encephalopathy due to opiate overdose and COVID-19 infection.  Work-up including ammonia, alcohol level, blood cultures and CT not revealing.  COVID-19 positive.  Oriented x4 this morning.  No focal neuro deficit. -Treat treatable causes -Avoid sedating medications. -COWs. Will resume home opiates at low dose so she does of withdrawal   Acute respiratory failure with hypoxia and hypercarbia due to opiate overdose and COVID-19 infection.  CXR without acute finding.  Inflammatory markers elevated.  Now saturating 92 to 94% on RA.  No respiratory distress. Recent Labs    11/19/19 2149 11/20/19 0203  DDIMER 2.54* 2.69*  FERRITIN 204 215  LDH 245*  --   CRP 10.3* 12.9*  -  Continue remdesivir and Decadron 1/12> -Subcu Lovenox for VTE prophylaxis -Supportive care with inhalers, antitussive/mucolytic, vitamins and incentive spirometry -OOB/PT/OT -Trend inflammatory markers.  History of breast cancer with metastasis to brain: started on whole brain radiation on 10/09/2019 and on Tucatinib for maintenance.  Followed by Dr. Jana Hakim. CT with known brain edema. 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  Anemia of chronic disease: H&H stable. -Continue monitoring  Elevated liver enzymes: Likely due to COVID-19 -Continue monitoring  Hypomagnesemia -Replenish and recheck  Essential hypertension: Normotensive for most part -Resume home Maxzide.  Constipation: Reports bowel movement on 1/12.  KUB with stool burden -Bowel regimen as needed                  DVT prophylaxis: Subcu Lovenox Code Status: Full code Family Communication: Updated patient's daughter over the phone. No answer when I called her husband. Disposition Plan: Remains inpatient for treatment of COVID-19 infection Consultants: Oncology   Microbiology summarized: [id number] positive. Blood cultures negative so far. MRSA PCR positive.  Sch Meds:  Scheduled Meds: . Chlorhexidine Gluconate Cloth  6 each Topical Daily  . dexamethasone (DECADRON) injection  6 mg Intravenous Q24H  . enoxaparin (LOVENOX) injection  40 mg Subcutaneous Q12H  . mouth rinse  15 mL Mouth Rinse BID  . mupirocin ointment  1 application Nasal BID  . sodium chloride flush  3 mL Intravenous Q12H  . sodium chloride flush  3 mL Intravenous Q12H   Continuous Infusions: . sodium chloride    . remdesivir 100 mg in NS 100 mL Stopped (11/20/19 1020)   PRN Meds:.sodium chloride, acetaminophen, ondansetron **OR** ondansetron (ZOFRAN) IV, polyethylene glycol, senna-docusate, sodium chloride flush  Antimicrobials: Anti-infectives (From admission, onward)   Start     Dose/Rate Route Frequency Ordered Stop   11/20/19 1000  remdesivir 100 mg in sodium chloride 0.9 % 100 mL IVPB  Status:  Discontinued     100 mg 200 mL/hr over 30 Minutes Intravenous Daily 11/20/19 0027 11/20/19 0035   11/20/19 1000  remdesivir 100 mg in sodium chloride 0.9 % 100 mL IVPB     100 mg 200 mL/hr over 30 Minutes Intravenous Daily 11/19/19 2046 11/24/19 0959   11/20/19 0030  remdesivir 200  mg in sodium chloride 0.9% 250 mL IVPB  Status:  Discontinued     200 mg 580 mL/hr over 30 Minutes Intravenous Once 11/20/19 0027 11/20/19 0035   11/19/19 2100  remdesivir 200 mg in sodium chloride 0.9% 250 mL IVPB     200 mg 580 mL/hr over 30 Minutes Intravenous Once 11/19/19 2046 11/19/19 2202       I have personally reviewed the following labs and images: CBC: Recent Labs  Lab 11/19/19 1831 11/20/19 0203  WBC 10.3 13.3*  NEUTROABS 7.9* 12.1*  HGB 10.0* 9.7*  HCT 33.0* 31.8*  MCV 90.2 89.3  PLT 168 163   BMP &GFR Recent Labs  Lab 11/19/19 1831 11/20/19 0203  NA 137 139  K 3.6 4.1  CL 97* 100  CO2 29 29  GLUCOSE 116* 127*  BUN 12 10  CREATININE 0.98 0.90  CALCIUM 8.5* 8.4*  MG  --  1.6*   Estimated Creatinine Clearance: 69.9 mL/min (by C-G formula based on SCr of 0.9 mg/dL). Liver & Pancreas: Recent Labs  Lab  11/19/19 1831 11/20/19 0203  AST 42* 38  ALT 25 24  ALKPHOS 67 61  BILITOT 0.3 0.6  PROT 6.9 6.6  ALBUMIN 3.4* 3.2*   No results for input(s): LIPASE, AMYLASE in the last 168 hours. Recent Labs  Lab 11/19/19 1815  AMMONIA 27   Diabetic: No results for input(s): HGBA1C in the last 72 hours. No results for input(s): GLUCAP in the last 168 hours. Cardiac Enzymes: No results for input(s): CKTOTAL, CKMB, CKMBINDEX, TROPONINI in the last 168 hours. No results for input(s): PROBNP in the last 8760 hours. Coagulation Profile: No results for input(s): INR, PROTIME in the last 168 hours. Thyroid Function Tests: No results for input(s): TSH, T4TOTAL, FREET4, T3FREE, THYROIDAB in the last 72 hours. Lipid Profile: No results for input(s): CHOL, HDL, LDLCALC, TRIG, CHOLHDL, LDLDIRECT in the last 72 hours. Anemia Panel: Recent Labs    11/19/19 2149 11/20/19 0203  FERRITIN 204 215   Urine analysis:    Component Value Date/Time   COLORURINE YELLOW 02/13/2018 [place] 02/13/2018 1355   LABSPEC 1.010 02/13/2018 1355   PHURINE 7.0  02/13/2018 1355   GLUCOSEU NEGATIVE 02/13/2018 1355   [place] 02/13/2018 1355   [place] 02/13/2018 1355   KETONESUR NEGATIVE 02/13/2018 1355   PROTEINUR NEGATIVE 02/13/2018 1355   UROBILINOGEN 0.2 12/29/2010 1427   NITRITE NEGATIVE 02/13/2018 1355   LEUKOCYTESUR NEGATIVE 02/13/2018 1355   Sepsis Labs: Invalid input(s): PROCALCITONIN, [place]  Microbiology: Recent Results (from the past 240 hour(s))  Blood culture (routine x 2)     Status: None (Preliminary result)   Collection Time: 11/19/19  6:19 PM   Specimen: BLOOD LEFT ARM  Result Value Ref Range Status   Specimen Description   Final    BLOOD LEFT ARM Performed at [facility] [address], [place], [place] [address]    Special Requests   Final    BOTTLES DRAWN AEROBIC AND ANAEROBIC Blood Culture results may not be optimal due to an inadequate volume of blood received in culture bottles Performed at [facility] [address], [place], [place] [address]    Culture   Final    NO GROWTH < 12 HOURS Performed at [facility] [address], [place], [place] [address]    Report Status PENDING  Incomplete  Blood culture (routine x 2)     Status: None (Preliminary result)   Collection Time: 11/19/19  6:31 PM   Specimen: BLOOD  Result Value Ref Range Status   Specimen Description   Final    BLOOD PORTA CATH Performed at [facility] [address], [place], [place] [address]    Special Requests   Final    BOTTLES DRAWN AEROBIC AND ANAEROBIC Blood Culture adequate volume Performed at [facility] [address], [place], [place] [address]    Culture   Final    NO GROWTH < 12 HOURS Performed at [facility] [address], [place], [place] [address]    Report Status PENDING  Incomplete  MRSA PCR Screening     Status: Abnormal   Collection Time: 11/20/19 12:58 AM   Specimen: Nasopharyngeal  Result Value  Ref Range Status   MRSA by PCR POSITIVE (A) NEGATIVE Final    Comment:        The GeneXpert MRSA Assay (FDA approved for NASAL specimens only), is one component of a comprehensive MRSA colonization surveillance program. It is  not intended to diagnose MRSA infection nor to guide or monitor treatment for MRSA infections. RESULT CALLED TO, READ BACK BY AND VERIFIED WITH: GROGAN, B. @ 0745 11/20/2019 PERRY, J. Performed at [facility] [place] [address] [place], [place] [address]     Radiology Studies: DG Abd 1 View  Result Date: 11/19/2019 CLINICAL DATA:  Abdominal distention EXAM: ABDOMEN - 1 VIEW COMPARISON:  CT 03/01/2010 FINDINGS: Nonobstructive bowel gas pattern. Gas and large stool burden within the colon. No visible free air. IMPRESSION: Large stool burden throughout the colon. No evidence of bowel obstruction. Electronically Signed   By: Rolm Baptise [name]   On: 11/19/2019 22:13   CT Head Wo Contrast  Result Date: 11/19/2019 CLINICAL DATA:  64 year old female with head trauma. History of breast cancer with metastases to the brain. EXAM: CT HEAD WITHOUT CONTRAST CT CERVICAL SPINE WITHOUT CONTRAST TECHNIQUE: Multidetector CT imaging of the head and cervical spine was performed following the standard protocol without intravenous contrast. Multiplanar CT image reconstructions of the cervical spine were also generated. COMPARISON:  Brain MRI dated 09/04/2019 and CT dated 02/06/2018. FINDINGS: CT HEAD FINDINGS Brain: Is defined right frontal lobe mass measuring 18 x 14 mm. There is associated moderate surrounding vasogenic edema. There is edema also in the right temporal lobe likely secondary to underlying mass which is suboptimally seen on this noncontrast CT but in keeping with known history of antacids cyst. There is no mass effect or midline shift. The ventricles and sulci are otherwise appropriate size for patient's age. The gray-white matter discrimination is preserved.  There is no acute intracranial hemorrhage. No extra-axial fluid collection. There is an empty sella turcica. Vascular: No hyperdense vessel or unexpected calcification. Skull: Normal. Negative for fracture or focal lesion. Sinuses/Orbits: Mild mucoperiosteal thickening of paranasal sinuses. No air-fluid level. The mastoid air cells are clear. Other: None CT CERVICAL SPINE FINDINGS Alignment: No acute subluxation. There is straightening of normal cervical lordosis which may be positional or due to muscle spasm. Skull base and vertebrae: No acute fracture. Soft tissues and spinal canal: No prevertebral fluid or swelling. No visible canal hematoma. Disc levels:  Mild degenerative changes. Upper chest: Negative. Other: Partially visualized Port-A-Cath. IMPRESSION: 1. No acute intracranial hemorrhage. 2. Right frontal and right parietal edema as seen on the prior MRI and in keeping with known metastatic disease. 3. No acute/traumatic cervical spine pathology. Electronically Signed   By: Anner Crete [name]   On: 11/19/2019 19:31   CT Cervical Spine Wo Contrast  Result Date: 11/19/2019 CLINICAL DATA:  64 year old female with head trauma. History of breast cancer with metastases to the brain. EXAM: CT HEAD WITHOUT CONTRAST CT CERVICAL SPINE WITHOUT CONTRAST TECHNIQUE: Multidetector CT imaging of the head and cervical spine was performed following the standard protocol without intravenous contrast. Multiplanar CT image reconstructions of the cervical spine were also generated. COMPARISON:  Brain MRI dated 09/04/2019 and CT dated 02/06/2018. FINDINGS: CT HEAD FINDINGS Brain: Is defined right frontal lobe mass measuring 18 x 14 mm. There is associated moderate surrounding vasogenic edema. There is edema also in the right temporal lobe likely secondary to underlying mass which is suboptimally seen on this noncontrast CT but in keeping with known history of antacids cyst. There is no mass effect or midline shift. The  ventricles and sulci are otherwise appropriate size for patient's age. The gray-white matter discrimination is preserved. There is no acute intracranial hemorrhage. No extra-axial fluid collection. There is an empty sella turcica. Vascular:  No hyperdense vessel or unexpected calcification. Skull: Normal. Negative for fracture or focal lesion. Sinuses/Orbits: Mild mucoperiosteal thickening of paranasal sinuses. No air-fluid level. The mastoid air cells are clear. Other: None CT CERVICAL SPINE FINDINGS Alignment: No acute subluxation. There is straightening of normal cervical lordosis which may be positional or due to muscle spasm. Skull base and vertebrae: No acute fracture. Soft tissues and spinal canal: No prevertebral fluid or swelling. No visible canal hematoma. Disc levels:  Mild degenerative changes. Upper chest: Negative. Other: Partially visualized Port-A-Cath. IMPRESSION: 1. No acute intracranial hemorrhage. 2. Right frontal and right parietal edema as seen on the prior MRI and in keeping with known metastatic disease. 3. No acute/traumatic cervical spine pathology. Electronically Signed   By: Anner Crete [name]   On: 11/19/2019 19:31   DG Chest Port 1 View  Result Date: 11/19/2019 CLINICAL DATA:  64 year old female with history of cough and hypoxia. Found unresponsive by family. Possible aspiration. EXAM: PORTABLE CHEST 1 VIEW COMPARISON:  Chest x-ray 10/11/2019. FINDINGS: Left internal jugular single-lumen power porta cath with tip terminating in the right atrium. Mild linear scarring in the left mid lung, unchanged. Lung volumes are low. No consolidative airspace disease. No pleural effusions. No pneumothorax. No pulmonary nodule or mass noted. Pulmonary vasculature and the cardiomediastinal silhouette are within normal limits. Aortic atherosclerosis. Surgical clips in the region of the right axilla, presumably from prior lymph node dissection. IMPRESSION: 1. No radiographic evidence of acute  cardiopulmonary disease. 2. Aortic atherosclerosis. Electronically Signed   By: Vinnie Langton [name]   On: 11/19/2019 18:41    45 minutes with more than 50% spent in reviewing records, counseling patient/family and coordinating care.   Denzell Colasanti T. [place]  If 7PM-7AM, please contact night-coverage [email or web address] Password [email or web address] 11/20/2019, 11:13 AM

## 2019-11-20 NOTE — Progress Notes (Signed)
RN notified by lab that patient was positive for MRSA in the nares, standing orders placed for patient.

## 2019-11-20 NOTE — Progress Notes (Signed)
Patient had a temperature of 103.1. PRN tylenol was given. MD notified

## 2019-11-21 ENCOUNTER — Inpatient Hospital Stay (HOSPITAL_COMMUNITY)
Admit: 2019-11-21 | Discharge: 2019-11-21 | Disposition: A | Discharge status: Home / Self Care | Payer: Medicare HMO | Attending: Family Medicine | Admitting: Family Medicine

## 2019-11-21 DIAGNOSIS — R4182 Altered mental status, unspecified: Secondary | ICD-10-CM

## 2019-11-21 LAB — C-REACTIVE PROTEIN: CRP: 14.1 mg/dL — ABNORMAL HIGH (ref <1.0)

## 2019-11-21 LAB — CBC WITH DIFFERENTIAL/PLATELET
Abs Immature Granulocytes: 0.14 10*3/uL — ABNORMAL HIGH (ref 0.00–0.07)
Basophils Absolute: 0 10*3/uL (ref 0.0–0.1)
Basophils Relative: 0 %
Eosinophils Absolute: 0 10*3/uL (ref 0.0–0.5)
Eosinophils Relative: 0 %
HCT: 32.4 % — ABNORMAL LOW (ref 36.0–46.0)
Hemoglobin: 10.1 g/dL — ABNORMAL LOW (ref 12.0–15.0)
Immature Granulocytes: 1 %
Lymphocytes Relative: 6 %
Lymphs Abs: 0.7 10*3/uL (ref 0.7–4.0)
MCH: 27.6 pg (ref 26.0–34.0)
MCHC: 31.2 g/dL (ref 30.0–36.0)
MCV: 88.5 fL (ref 80.0–100.0)
Monocytes Absolute: 0.6 10*3/uL (ref 0.1–1.0)
Monocytes Relative: 4 %
Neutro Abs: 11.5 10*3/uL — ABNORMAL HIGH (ref 1.7–7.7)
Neutrophils Relative %: 89 %
Platelets: 199 10*3/uL (ref 150–400)
RBC: 3.66 MIL/uL — ABNORMAL LOW (ref 3.87–5.11)
RDW: 17.9 % — ABNORMAL HIGH (ref 11.5–15.5)
WBC: 13 10*3/uL — ABNORMAL HIGH (ref 4.0–10.5)
nRBC: 0 % (ref 0.0–0.2)

## 2019-11-21 LAB — COMPREHENSIVE METABOLIC PANEL
ALT: 26 U/L (ref 0–44)
AST: 31 U/L (ref 15–41)
Albumin: 2.8 g/dL — ABNORMAL LOW (ref 3.5–5.0)
Alkaline Phosphatase: 59 U/L (ref 38–126)
Anion gap: 9 (ref 5–15)
BUN: 19 mg/dL (ref 8–23)
CO2: 30 mmol/L (ref 22–32)
Calcium: 8.9 mg/dL (ref 8.9–10.3)
Chloride: 100 mmol/L (ref 98–111)
Creatinine, Ser: 0.83 mg/dL (ref 0.44–1.00)
GFR calc Af Amer: >60 mL/min (ref >60)
GFR calc non Af Amer: >60 mL/min (ref >60)
Glucose, Bld: 141 mg/dL — ABNORMAL HIGH (ref 70–99)
Potassium: 4.1 mmol/L (ref 3.5–5.1)
Sodium: 139 mmol/L (ref 135–145)
Total Bilirubin: 0.3 mg/dL (ref 0.3–1.2)
Total Protein: 6.7 g/dL (ref 6.5–8.1)

## 2019-11-21 LAB — AMMONIA: Ammonia: 25 umol/L (ref 9–35)

## 2019-11-21 LAB — D-DIMER, QUANTITATIVE: D-Dimer, Quant: 1.52 ug/mL-FEU — ABNORMAL HIGH (ref 0.00–0.50)

## 2019-11-21 LAB — FERRITIN: Ferritin: 274 ng/mL (ref 11–307)

## 2019-11-21 MED ORDER — SENNOSIDES-DOCUSATE SODIUM 8.6-50 MG PO TABS: 1.0000 | ORAL_TABLET | Freq: Two times a day (BID) | ORAL | 1 refills | Status: DC | PRN

## 2019-11-21 MED ORDER — ANTICOAGULANT SODIUM CITRATE 4% (200MG/5ML) IV SOLN
5.0000 mL | Status: AC | PRN
  Administered 2019-11-21: 5 mL
  Filled 2019-11-21: qty 5

## 2019-11-21 MED ORDER — SODIUM CHLORIDE 0.9% FLUSH: 10.0000 mL | INTRAVENOUS | Status: DC | PRN

## 2019-11-21 MED ORDER — POLYETHYLENE GLYCOL 3350 17 G PO PACK: 17.0000 g | PACK | Freq: Every day | ORAL | 0 refills | Status: DC | PRN

## 2019-11-21 NOTE — Discharge Instructions (Addendum)
 COVID-19 COVID-19 is a respiratory infection that is caused by a virus called severe acute respiratory syndrome coronavirus 2 (SARS-CoV-2). The disease is also known as coronavirus disease or novel coronavirus. In some people, the virus may not cause any symptoms. In others, it may cause a serious infection. The infection can get worse quickly and can lead to complications, such as:  Pneumonia, or infection of the lungs.  Acute respiratory distress syndrome or ARDS. This is a condition in which fluid build-up in the lungs prevents the lungs from filling with air and passing oxygen into the blood.  Acute respiratory failure. This is a condition in which there is not enough oxygen passing from the lungs to the body or when carbon dioxide is not passing from the lungs out of the body.  Sepsis or septic shock. This is a serious bodily reaction to an infection.  Blood clotting problems.  Secondary infections due to bacteria or fungus.  Organ failure. This is when your body's organs stop working. The virus that causes COVID-19 is contagious. This means that it can spread from person to person through droplets from coughs and sneezes (respiratory secretions). What are the causes? This illness is caused by a virus. You may catch the virus by:  Breathing in droplets from an infected person. Droplets can be spread by a person breathing, speaking, singing, coughing, or sneezing.  Touching something, like a table or a doorknob, that was exposed to the virus (contaminated) and then touching your mouth, nose, or eyes. What increases the risk? Risk for infection You are more likely to be infected with this virus if you:  Are within 6 feet (2 meters) of a person with COVID-19.  Provide care for or live with a person who is infected with COVID-19.  Spend time in crowded indoor spaces or live in shared housing. Risk for serious illness You are more likely to become seriously ill from the virus if  you:  Are 50 years of age or older. The higher your age, the more you are at risk for serious illness.  Live in a nursing home or long-term care facility.  Have cancer.  Have a long-term (chronic) disease such as: ? Chronic lung disease, including chronic obstructive pulmonary disease or asthma. ? A long-term disease that lowers your body's ability to fight infection (immunocompromised). ? Heart disease, including heart failure, a condition in which the arteries that lead to the heart become narrow or blocked (coronary artery disease), a disease which makes the heart muscle thick, weak, or stiff (cardiomyopathy). ? Diabetes. ? Chronic kidney disease. ? Sickle cell disease, a condition in which red blood cells have an abnormal "sickle" shape. ? Liver disease.  Are obese. What are the signs or symptoms? Symptoms of this condition can range from mild to severe. Symptoms may appear any time from 2 to 14 days after being exposed to the virus. They include:  A fever or chills.  A cough.  Difficulty breathing.  Headaches, body aches, or muscle aches.  Runny or stuffy (congested) nose.  A sore throat.  New loss of taste or smell. Some people may also have stomach problems, such as nausea, vomiting, or diarrhea. Other people may not have any symptoms of COVID-19. How is this diagnosed? This condition may be diagnosed based on:  Your signs and symptoms, especially if: ? You live in an area with a COVID-19 outbreak. ? You recently traveled to or from an area where the virus is common. ?   You provide care for or live with a person who was diagnosed with COVID-19. ? You were exposed to a person who was diagnosed with COVID-19.  A physical exam.  Lab tests, which may include: ? Taking a sample of fluid from the back of your nose and throat (nasopharyngeal fluid), your nose, or your throat using a swab. ? A sample of mucus from your lungs (sputum). ? Blood tests.  Imaging tests,  which may include, X-rays, CT scan, or ultrasound. How is this treated? At present, there is no medicine to treat COVID-19. Medicines that treat other diseases are being used on a trial basis to see if they are effective against COVID-19. Your health care provider will talk with you about ways to treat your symptoms. For most people, the infection is mild and can be managed at home with rest, fluids, and over-the-counter medicines. Treatment for a serious infection usually takes places in a hospital intensive care unit (ICU). It may include one or more of the following treatments. These treatments are given until your symptoms improve.  Receiving fluids and medicines through an IV.  Supplemental oxygen. Extra oxygen is given through a tube in the nose, a face mask, or a hood.  Positioning you to lie on your stomach (prone position). This makes it easier for oxygen to get into the lungs.  Continuous positive airway pressure (CPAP) or bi-level positive airway pressure (BPAP) machine. This treatment uses mild air pressure to keep the airways open. A tube that is connected to a motor delivers oxygen to the body.  Ventilator. This treatment moves air into and out of the lungs by using a tube that is placed in your windpipe.  Tracheostomy. This is a procedure to create a hole in the neck so that a breathing tube can be inserted.  Extracorporeal membrane oxygenation (ECMO). This procedure gives the lungs a chance to recover by taking over the functions of the heart and lungs. It supplies oxygen to the body and removes carbon dioxide. Follow these instructions at home: Lifestyle  If you are sick, stay home except to get medical care. Your health care provider will tell you how long to stay home. Call your health care provider before you go for medical care.  Rest at home as told by your health care provider.  Do not use any products that contain nicotine or tobacco, such as cigarettes,  e-cigarettes, and chewing tobacco. If you need help quitting, ask your health care provider.  Return to your normal activities as told by your health care provider. Ask your health care provider what activities are safe for you. General instructions  Take over-the-counter and prescription medicines only as told by your health care provider.  Drink enough fluid to keep your urine pale yellow.  Keep all follow-up visits as told by your health care provider. This is important. How is this prevented?  There is no vaccine to help prevent COVID-19 infection. However, there are steps you can take to protect yourself and others from this virus. To protect yourself:   Do not travel to areas where COVID-19 is a risk. The areas where COVID-19 is reported change often. To identify high-risk areas and travel restrictions, check the CDC travel website: wwwnc.cdc.gov/travel/notices  If you live in, or must travel to, an area where COVID-19 is a risk, take precautions to avoid infection. ? Stay away from people who are sick. ? Wash your hands often with soap and water for 20 seconds. If soap and   water are not available, use an alcohol-based hand sanitizer. ? Avoid touching your mouth, face, eyes, or nose. ? Avoid going out in public, follow guidance from your state and local health authorities. ? If you must go out in public, wear a cloth face covering or face mask. Make sure your mask covers your nose and mouth. ? Avoid crowded indoor spaces. Stay at least 6 feet (2 meters) away from others. ? Disinfect objects and surfaces that are frequently touched every day. This may include:  Counters and tables.  Doorknobs and light switches.  Sinks and faucets.  Electronics, such as phones, remote controls, keyboards, computers, and tablets. To protect others: If you have symptoms of COVID-19, take steps to prevent the virus from spreading to others.  If you think you have a COVID-19 infection, contact  your health care provider right away. Tell your health care team that you think you may have a COVID-19 infection.  Stay home. Leave your house only to seek medical care. Do not use public transport.  Do not travel while you are sick.  Wash your hands often with soap and water for 20 seconds. If soap and water are not available, use alcohol-based hand sanitizer.  Stay away from other members of your household. Let healthy household members care for children and pets, if possible. If you have to care for children or pets, wash your hands often and wear a mask. If possible, stay in your own room, separate from others. Use a different bathroom.  Make sure that all people in your household wash their hands well and often.  Cough or sneeze into a tissue or your sleeve or elbow. Do not cough or sneeze into your hand or into the air.  Wear a cloth face covering or face mask. Make sure your mask covers your nose and mouth. Where to find more information  Centers for Disease Control and Prevention: www.cdc.gov/coronavirus/2019-ncov/index.html  World Health Organization: www.who.int/health-topics/coronavirus Contact a health care provider if:  You live in or have traveled to an area where COVID-19 is a risk and you have symptoms of the infection.  You have had contact with someone who has COVID-19 and you have symptoms of the infection. Get help right away if:  You have trouble breathing.  You have pain or pressure in your chest.  You have confusion.  You have bluish lips and fingernails.  You have difficulty waking from sleep.  You have symptoms that get worse. These symptoms may represent a serious problem that is an emergency. Do not wait to see if the symptoms will go away. Get medical help right away. Call your local emergency services (911 in the [place] Do not drive yourself to the hospital. Let the emergency medical personnel know if you think you have  COVID-19. Summary  COVID-19 is a respiratory infection that is caused by a virus. It is also known as coronavirus disease or novel coronavirus. It can cause serious infections, such as pneumonia, acute respiratory distress syndrome, acute respiratory failure, or sepsis.  The virus that causes COVID-19 is contagious. This means that it can spread from person to person through droplets from breathing, speaking, singing, coughing, or sneezing.  You are more likely to develop a serious illness if you are 50 years of age or older, have a weak immune system, live in a nursing home, or have chronic disease.  There is no medicine to treat COVID-19. Your health care provider will talk with you about ways to treat your   symptoms.  Take steps to protect yourself and others from infection. Wash your hands often and disinfect objects and surfaces that are frequently touched every day. Stay away from people who are sick and wear a mask if you are sick. This information is not intended to replace advice given to you by your health care provider. Make sure you discuss any questions you have with your health care provider. Document Revised: 08/23/2019 Document Reviewed: 11/29/2018 Elsevier Patient Education  2020 [place] are scheduled for an outpatient infusion of Remdesivir at [facility] on Friday 1/15 and 1PM on Saturday 1/16.   Please report to [facility] at [address]  Drive to the security guard and tell them you are here for an infusion. They will direct you to the front entrance where we will come and get you.  For questions call [phone].  Thanks

## 2019-11-21 NOTE — Progress Notes (Signed)
EEG complete - results pending 

## 2019-11-21 NOTE — Discharge Summary (Signed)
Physician Discharge Summary  [name] QZR:[id number] DOB: May 03, 1956 DOA: 11/19/2019  PCP: Patient, No Pcp Per  Admit date: 11/19/2019 Discharge date: 11/21/2019  Admitted From: Home. Disposition: Home  Recommendations for Outpatient Follow-up:  1. Follow ups as below. 2. Please obtain CBC/BMP/Mag at follow up 3. 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  Was saturating at 82% when EMS arrived.  Received Narcan with improvement in her mental status.  In ED, febrile to 103.  Hemodynamically stable.  Saturating in mid 90s on 4 L by [place].  ABG with acute on chronic respiratory acidosis.  AST 42.    Hgb 10.  Otherwise, CBC and CMP without significant finding.  EtOH level negative. Ammonia 27. COVID-19 positive.  CRP 10.3.    Pro-Cal 0.63 (unreliable inpatient on chemo).  Other inflammatory markers elevated as well.  CXR negative.  KUB with distal borders.  EKG without significant finding.  CT head negative for acute finding but known right frontal and right parietal edema.  Admitted for acute respiratory failure, encephalopathy and COVID-19 infection.  Started on Decadron and remdesivir.  Patient's encephalopathy resolved the next day.  EEG with encephalopathy but no epileptiform discharges or seizure activity.  Received Decadron and remdesivir 1/12-1/14.  Remained stable from a cardiopulmonary standpoint.  Felt well and requested to go home.  Patient to complete remdesivir infusion at [facility].  She is already on  dexamethasone 4 mg twice daily for brain mets which could cover her for COVID-19 infection.  Patient was evaluated by PT/OT prior to discharge and no home health needed was identified by his rolling walker and 3 in 1 commode.  See individual problem list below for more on hospital course Discharge Diagnoses:  Acute toxic metabolic encephalopathy due to opiate overdose and COVID-19 infection.  Work-up including ammonia, alcohol level, blood cultures and CT not revealing.  EEG with encephalopathy but no epileptiform discharge or seizure activity. COVID-19 positive.  Oriented x4 this morning.  No focal neuro deficit. -Encouraged to be cautious with opiates and benzos   Acute respiratory failure with hypoxia and hypercarbia due to opiate overdose and COVID-19 infection.  CXR without acute finding.  Inflammatory markers elevated but no respiratory symptoms or discharge.  She is on room air. -Continue home Decadron 4 mg twice daily -Remdesivir infusion at [facility] for 2 more days to complete 5 days course -Counseled on infection prevention and return precautions  History of breast cancer with metastasis to brain: started on whole brain radiation on 10/09/2019 and on Tucatinib for maintenance.  Followed by Dr. Jana Hakim. CT with known brain edema. -Follow-up with oncology.  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  Chronic cancer pain on opiates -Counseled on judicious use of pain medications  Anemia of chronic disease: H&H stable. -Continue monitoring  Elevated liver enzymes: Likely due to COVID-19.  Resolved. -Continue monitoring  Hypomagnesemia: Resolved.  Essential hypertension:  -Continue home Maxzide  Constipation: Likely related to opiate use.  Reports bowel movement on 1/12.  -  Bowel regimen as needed  Family communication: Discussed plan with patient's husband and daughter over the phone.  Discharge  Instructions  Discharge Instructions    Diet - low sodium heart healthy   Complete by: As directed    Discharge instructions   Complete by: As directed    It has been a pleasure taking care of you! You were hospitalized and treated for altered mental status and COVID-19 infection.  You altered mental status could be due to unintentional overdose of his pain medication.  We have made some adjustment to your home pain medications.  We strongly recommend to be cautious with your pain medications and use your Narcan injection if needed. Please review your new medication list and the directions before you take your medications.   In regards to COVID-19 infection, you will receive more treatment of remdesivir infusion at the [facility] infusion center (former [facility]) for the next 2 days.  Someone will set up this schedule and notify you. Please report to [facility]ott[facility]ussel at [address].  Drive to the security guard and tell them you are here for an infusion. They will direct you to the front entrance where we will come and get you.  For questions call [phone].   You are still potentially infectious for the next 10 days. We recommend you isolate yourself and take the necessary precautions to prevent the virus from spreading.  Some of the steps to prevent the virus from spreading to others: Stay away from other and members of your household at least for 10 days. Let healthy household members care for children and pets, if possible. If you have to care for children or pets, wash your hands often and wear a mask. If possible, stay in your own room, separate from others. Use a different bathroom.Make sure that all people in your household wash their hands well and often. Leave your house only to seek medical care. Do not use public transport. Do not travel while you are sick. Wash your hands often with soap and water for 20 seconds. If soap and water are not available, use  alcohol-based hand sanitizer. Cough or sneeze into a tissue or your sleeve or elbow. Do not cough or sneeze into your hand or into the air. Wear a cloth face covering or face mask.  Return precautions: Get help or return to the hospital right away if: You have trouble breathing. You have pain or pressure in your chest. You have confusion. You have bluish lips and fingernails. You have difficulty waking from sleep. You have symptoms that get worse. These symptoms may represent a serious problem that is an emergency. Do not wait to see if the symptoms will go away. Get medical help right away. Call your local emergency services (911 in the [place] Do not drive yourself to the hospital. Let the emergency medical personnel know if you think you have COVID-19.  To protect yourself in the future:  Do not travel to areas where COVID-19 is a risk. The areas where COVID-19 is reported change often. To identify high-risk areas and travel restrictions, check the CDC travel website: FatFares.com.br If you live in, or must travel to, an area where COVID-19 is a risk, take precautions to avoid infection. Stay away from people who are sick. Wash your hands often with soap and water for 20 seconds. If soap and water are not available, use an alcohol-based hand sanitizer. Avoid touching your mouth, face, eyes, or nose. Avoid  going out in public, follow guidance from your state and local health authorities. If you must go out in public, wear a cloth face covering or face mask. Disinfect objects and surfaces that are frequently touched every day. This may include: Counters and tables. Doorknobs and light switches. Sinks and faucets. Electronics, such as phones, remote controls, keyboards, computers, and tablets.    Where to find more information Centers for Disease Control and Prevention: PurpleGadgets.be World Health Organization:  https://www.castaneda.info/   Take care,   Increase activity slowly   Complete by: As directed      Allergies as of 11/21/2019      Reactions   Tramadol Nausea Only   Hydrocodone-acetaminophen Itching, Rash   Pork-derived Products Other (See Comments)   Pt says she just doesn't eat pork; has no reaction to pork products      Medication List    STOP taking these medications   hydrOXYzine 25 MG tablet Commonly known as: ATARAX/VISTARIL   oxymorphone 10 MG tablet Commonly known as: OPANA     TAKE these medications   ALPRAZolam 1 MG tablet Commonly known as: XANAX Take 1 mg by mouth at bedtime as needed for sleep.   cholecalciferol 1000 units tablet Commonly known as: VITAMIN D Take 1 tablet (1,000 Units total) by mouth daily.   dexamethasone 4 MG tablet Commonly known as: DECADRON Take 2 mg by mouth 2 (two) times daily.   gabapentin 600 MG tablet Commonly known as: NEURONTIN Take 600 mg by mouth 3 (three) times daily.   hydrocortisone 20 MG tablet Commonly known as: CORTEF as directed.   magic mouthwash Soln Take 10 mLs by mouth 4 (four) times daily -  before meals and at bedtime. Swish and spit.   methadone 10 MG tablet Commonly known as: DOLOPHINE Take 1 tablet (10 mg total) by mouth 3 (three) times daily. What changed: when to take this   multivitamin capsule Take 1 capsule by mouth daily.   naloxone 4 MG/0.1ML Liqd nasal spray kit Commonly known as: NARCAN Administer single spray of naloxone into one nostril. Additionally, spray can be given every 2-3 minutes as needed until emergency medical assistance arrives. Call 911 immediately after use.   Oxycodone HCl 20 MG Tabs Take 20 mg by mouth 3 (three) times daily as needed (pain).   polyethylene glycol 17 g packet Commonly known as: MIRALAX / GLYCOLAX Take 17 g by mouth daily as needed for mild constipation.   potassium chloride SA 20 MEQ tablet Commonly known as: KLOR-CON Take 20 mEq by  mouth daily.   prochlorperazine 10 MG tablet Commonly known as: COMPAZINE Take 1 tablet (10 mg total) by mouth every 6 (six) hours as needed for nausea or vomiting.   senna-docusate 8.6-50 MG tablet Commonly known as: Senokot-S Take 1 tablet by mouth 2 (two) times daily as needed for moderate constipation.   sertraline 100 MG tablet Commonly known as: ZOLOFT Take 1 tablet (100 mg total) by mouth daily.   triamterene-hydrochlorothiazide 37.5-25 MG capsule Commonly known as: DYAZIDE Take 1 each (1 capsule total) by mouth every morning.   tucatinib 150 MG tablet Commonly known as: TUKYSA Take 2 tablets (300 mg total) by mouth every morning. Take as directed by MD.   VITAMIN C PO Take 1 tablet by mouth daily.            Durable Medical Equipment  (From admission, onward)         Start     Ordered  11/21/19 1327  For home use only DME Walker rolling  Once    Question Answer Comment  Walker: With [place]   Patient needs a walker to treat with the following condition Unsteady gait      11/21/19 1327   11/21/19 1327  For home use only DME Bedside commode  Once    Comments: 3 in 1 commode  Question:  Patient needs a bedside commode to treat with the following condition  Answer:  Unsteady gait   11/21/19 1327          Consultations:  Oncology  Procedures/Studies:  EEG with encephalopathy but no epileptiform discharges or seizure.   DG Abd 1 View  Result Date: 11/19/2019 CLINICAL DATA:  Abdominal distention EXAM: ABDOMEN - 1 VIEW COMPARISON:  CT 03/01/2010 FINDINGS: Nonobstructive bowel gas pattern. Gas and large stool burden within the colon. No visible free air. IMPRESSION: Large stool burden throughout the colon. No evidence of bowel obstruction. Electronically Signed   By: Rolm Baptise [name]   On: 11/19/2019 22:13   CT Head Wo Contrast  Result Date: 11/19/2019 CLINICAL DATA:  64 year old female with head trauma. History of breast cancer with metastases  to the brain. EXAM: CT HEAD WITHOUT CONTRAST CT CERVICAL SPINE WITHOUT CONTRAST TECHNIQUE: Multidetector CT imaging of the head and cervical spine was performed following the standard protocol without intravenous contrast. Multiplanar CT image reconstructions of the cervical spine were also generated. COMPARISON:  Brain MRI dated 09/04/2019 and CT dated 02/06/2018. FINDINGS: CT HEAD FINDINGS Brain: Is defined right frontal lobe mass measuring 18 x 14 mm. There is associated moderate surrounding vasogenic edema. There is edema also in the right temporal lobe likely secondary to underlying mass which is suboptimally seen on this noncontrast CT but in keeping with known history of antacids cyst. There is no mass effect or midline shift. The ventricles and sulci are otherwise appropriate size for patient's age. The gray-white matter discrimination is preserved. There is no acute intracranial hemorrhage. No extra-axial fluid collection. There is an empty sella turcica. Vascular: No hyperdense vessel or unexpected calcification. Skull: Normal. Negative for fracture or focal lesion. Sinuses/Orbits: Mild mucoperiosteal thickening of paranasal sinuses. No air-fluid level. The mastoid air cells are clear. Other: None CT CERVICAL SPINE FINDINGS Alignment: No acute subluxation. There is straightening of normal cervical lordosis which may be positional or due to muscle spasm. Skull base and vertebrae: No acute fracture. Soft tissues and spinal canal: No prevertebral fluid or swelling. No visible canal hematoma. Disc levels:  Mild degenerative changes. Upper chest: Negative. Other: Partially visualized Port-A-Cath. IMPRESSION: 1. No acute intracranial hemorrhage. 2. Right frontal and right parietal edema as seen on the prior MRI and in keeping with known metastatic disease. 3. No acute/traumatic cervical spine pathology. Electronically Signed   By: Anner Crete [name]   On: 11/19/2019 19:31   CT Cervical Spine Wo  Contrast  Result Date: 11/19/2019 CLINICAL DATA:  64 year old female with head trauma. History of breast cancer with metastases to the brain. EXAM: CT HEAD WITHOUT CONTRAST CT CERVICAL SPINE WITHOUT CONTRAST TECHNIQUE: Multidetector CT imaging of the head and cervical spine was performed following the standard protocol without intravenous contrast. Multiplanar CT image reconstructions of the cervical spine were also generated. COMPARISON:  Brain MRI dated 09/04/2019 and CT dated 02/06/2018. FINDINGS: CT HEAD FINDINGS Brain: Is defined right frontal lobe mass measuring 18 x 14 mm. There is associated moderate surrounding vasogenic edema. There is edema also in the right  temporal lobe likely secondary to underlying mass which is suboptimally seen on this noncontrast CT but in keeping with known history of antacids cyst. There is no mass effect or midline shift. The ventricles and sulci are otherwise appropriate size for patient's age. The gray-white matter discrimination is preserved. There is no acute intracranial hemorrhage. No extra-axial fluid collection. There is an empty sella turcica. Vascular: No hyperdense vessel or unexpected calcification. Skull: Normal. Negative for fracture or focal lesion. Sinuses/Orbits: Mild mucoperiosteal thickening of paranasal sinuses. No air-fluid level. The mastoid air cells are clear. Other: None CT CERVICAL SPINE FINDINGS Alignment: No acute subluxation. There is straightening of normal cervical lordosis which may be positional or due to muscle spasm. Skull base and vertebrae: No acute fracture. Soft tissues and spinal canal: No prevertebral fluid or swelling. No visible canal hematoma. Disc levels:  Mild degenerative changes. Upper chest: Negative. Other: Partially visualized Port-A-Cath. IMPRESSION: 1. No acute intracranial hemorrhage. 2. Right frontal and right parietal edema as seen on the prior MRI and in keeping with known metastatic disease. 3. No acute/traumatic  cervical spine pathology. Electronically Signed   By: Anner Crete [name]   On: 11/19/2019 19:31   DG Chest Port 1 View  Result Date: 11/19/2019 CLINICAL DATA:  64 year old female with history of cough and hypoxia. Found unresponsive by family. Possible aspiration. EXAM: PORTABLE CHEST 1 VIEW COMPARISON:  Chest x-ray 10/11/2019. FINDINGS: Left internal jugular single-lumen power porta cath with tip terminating in the right atrium. Mild linear scarring in the left mid lung, unchanged. Lung volumes are low. No consolidative airspace disease. No pleural effusions. No pneumothorax. No pulmonary nodule or mass noted. Pulmonary vasculature and the cardiomediastinal silhouette are within normal limits. Aortic atherosclerosis. Surgical clips in the region of the right axilla, presumably from prior lymph node dissection. IMPRESSION: 1. No radiographic evidence of acute cardiopulmonary disease. 2. Aortic atherosclerosis. Electronically Signed   By: Vinnie Langton [name]   On: 11/19/2019 18:41   EEG adult  Result Date: 11/21/2019 Lora Havens, MD     11/21/2019 11:20 AM Patient Name: [name] MRN: [id number] Epilepsy Attending: Lora Havens Referring Physician/Provider: Dr. Wendee Beavers Date: 11/21/2019 Duration: 24.42 minutes Patient history: 64 year old female with history of breast cancer status postmastectomy, recent brain metastasis in right frontoparietal region, now COVID+ presented with altered mental status. Level of alertness: Awake AEDs during EEG study: None Technical aspects: This EEG study was done with scalp electrodes positioned according to the 10-20 International system of electrode placement. Electrical activity was acquired at a sampling rate of '500Hz'  and reviewed with a high frequency filter of '70Hz'  and a low frequency filter of '1Hz' . EEG data were recorded continuously and digitally stored. Description: The posterior dominant rhythm consists of 9-10 Hz activity of moderate voltage (25-35  uV) seen predominantly in posterior head regions, symmetric and reactive to eye opening and eye closing.  EEG also showed intermittent generalized 3 to 7 Hz theta-delta slowing.  Hyperventilation and photic stimulation were not performed.        Abnormality -Continuous slow, generalized IMPRESSION: This study showed evidence of mild diffuse encephalopathy, nonspecific to etiology. No seizures or epileptiform discharges were seen throughout the recording. Lora Havens       Discharge Exam: Vitals:   11/20/19 2134 11/21/19 0722  BP: 127/67 (!) 152/88  Pulse: 71 71  Resp: 20 20  Temp: 98.7 F (37.1 C) 98.3 F (36.8 C)  SpO2: 96% 96%    GENERAL: No acute  distress.  Appears well.  HEENT: MMM.  Vision and hearing grossly intact.  NECK: Supple.  No apparent JVD.  RESP:  No IWOB. Good air movement bilaterally. CVS:  RRR. Heart sounds normal.  ABD/GI/GU: Bowel sounds present. Soft. Non tender.  MSK/EXT:  Moves extremities. No apparent deformity or edema.  SKIN: no apparent skin lesion or wound NEURO: Awake, alert and oriented appropriately.  No apparent focal neuro deficit. PSYCH: Calm. Normal affect.   The results of significant diagnostics from this hospitalization (including imaging, microbiology, ancillary and laboratory) are listed below for reference.     Microbiology: Recent Results (from the past 240 hour(s))  Blood culture (routine x 2)     Status: None (Preliminary result)   Collection Time: 11/19/19  6:19 PM   Specimen: BLOOD LEFT ARM  Result Value Ref Range Status   Specimen Description   Final    BLOOD LEFT ARM Performed at [facility] [address] [place], [place] [address]    Special Requests   Final    BOTTLES DRAWN AEROBIC AND ANAEROBIC Blood Culture results may not be optimal due to an inadequate volume of blood received in culture bottles Performed at [facility] [address], [place], [place] [address]     Culture   Final    NO GROWTH 2 DAYS Performed at [facility] [address], [place], [place] [address]    Report Status PENDING  Incomplete  Blood culture (routine x 2)     Status: None (Preliminary result)   Collection Time: 11/19/19  6:31 PM   Specimen: BLOOD  Result Value Ref Range Status   Specimen Description   Final    BLOOD PORTA CATH Performed at [facility] [address] [place], [place] [address]    Special Requests   Final    BOTTLES DRAWN AEROBIC AND ANAEROBIC Blood Culture adequate volume Performed at [facility] [address], [place], [place] [address]    Culture   Final    NO GROWTH 2 DAYS Performed at [facility] [address] [place], [place] [address]    Report Status PENDING  Incomplete  MRSA PCR Screening     Status: Abnormal   Collection Time: 11/20/19 12:58 AM   Specimen: Nasopharyngeal  Result Value Ref Range Status   MRSA by PCR POSITIVE (A) NEGATIVE Final    Comment:        The GeneXpert MRSA Assay (FDA approved for NASAL specimens only), is one component of a comprehensive MRSA colonization surveillance program. It is not intended to diagnose MRSA infection nor to guide or monitor treatment for MRSA infections. RESULT CALLED TO, READ BACK BY AND VERIFIED WITH: GROGAN, B. @ 0745 11/20/2019 PERRY, J. Performed at [facility], [place] [address] [place],  [address]      Labs: BNP (last 3 results) No results for input(s): BNP in the last 8760 hours. Basic Metabolic Panel: Recent Labs  Lab 11/19/19 1831 11/20/19 0203 11/21/19 0430  NA 137 139 139  K 3.6 4.1 4.1  CL 97* 100 100  CO2 '29 29 30  ' GLUCOSE 116* 127* 141*  BUN '12 10 19  ' CREATININE 0.98 0.90 0.83  CALCIUM 8.5* 8.4* 8.9  MG  --  1.6*  --    Liver Function Tests: Recent Labs  Lab 11/19/19 1831 11/20/19 0203 11/21/19 0430  AST 42* 38 31  ALT 25 24 26  ALKPHOS 67 61 59  BILITOT 0.3 0.6  0.3  PROT 6.9 6.6 6.7  ALBUMIN 3.4* 3.2* 2.8*   No results for input(s): LIPASE, AMYLASE in the last 168 hours. Recent Labs  Lab 11/19/19 1815 11/21/19 0413  AMMONIA 27 25   CBC: Recent Labs  Lab 11/19/19 1831 11/20/19 0203 11/21/19 0430  WBC 10.3 13.3* 13.0*  NEUTROABS 7.9* 12.1* 11.5*  HGB 10.0* 9.7* 10.1*  HCT 33.0* 31.8* 32.4*  MCV 90.2 89.3 88.5  PLT 168 163 199   Cardiac Enzymes: No results for input(s): CKTOTAL, CKMB, CKMBINDEX, TROPONINI in the last 168 hours. BNP: Invalid input(s): POCBNP CBG: No results for input(s): GLUCAP in the last 168 hours. D-Dimer Recent Labs    11/20/19 0203 11/21/19 0430  DDIMER 2.69* 1.52*   Hgb A1c No results for input(s): HGBA1C in the last 72 hours. Lipid Profile No results for input(s): CHOL, HDL, LDLCALC, TRIG, CHOLHDL, LDLDIRECT in the last 72 hours. Thyroid function studies No results for input(s): TSH, T4TOTAL, T3FREE, THYROIDAB in the last 72 hours.  Invalid input(s): FREET3 Anemia work up Recent Labs    11/20/19 0203 11/21/19 0413  FERRITIN 215 274   Urinalysis    Component Value Date/Time   COLORURINE YELLOW 02/13/2018 [place] 02/13/2018 1355   LABSPEC 1.010 02/13/2018 1355   PHURINE 7.0 02/13/2018 [place] 02/13/2018 1355   HGBUR NEGATIVE 02/13/2018 [place] 02/13/2018 [place] 02/13/2018 1355   PROTEINUR NEGATIVE 02/13/2018 1355   UROBILINOGEN 0.2 12/29/2010 1427   NITRITE NEGATIVE 02/13/2018 1355   LEUKOCYTESUR NEGATIVE 02/13/2018 1355   Sepsis Labs Invalid input(s): PROCALCITONIN,  WBC,  LACTICIDVEN   Time coordinating discharge: 40 minutes  SIGNED:  Mercy Riding, MD  [facility] 11/21/2019, 5:20 PM  If 7PM-7AM, please contact night-coverage [email or web address] Password [email or web address]

## 2019-11-21 NOTE — Evaluation (Signed)
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  Was saturating at 82% when EMS arrived.  Received Narcan with improvement in her mental status. Admitted for acute respiratory failure, encephalopathy and COVID-19 infection.   Clinical Impression   Pt was admitted for the above.  Pt states that daughters live with her and that she is independent with adls. Some of the information she provided, was conflicting.  Pt had difficulty hearing as therapists had n95 masks and hepa filter blowing in room. She demonstrated decreased memory/safety and attention.  Feel she will need 24/7 initially.  Will follow in acute setting with supervision to mod I level goals.    Follow Up Recommendations  Supervision/Assistance - 24 hour(?HHOT depending upon progress/other services involved:  PT is not recommending follow up, and OT cannot stand alone)    Equipment Recommendations  3 in 1 bedside commode    Recommendations for Other Services       Precautions / Restrictions Precautions Precautions: Fall Restrictions Weight Bearing Restrictions: No      Mobility Bed Mobility Overal bed mobility: Needs Assistance Bed Mobility: Supine to Sit     Supine to sit: Min assist     General bed mobility comments: slow transition and assist for RLE.    Transfers Overall transfer level: Needs assistance Equipment used: Rolling walker (2 wheeled) Transfers: Sit to/from Stand Sit to Stand: Min guard         General transfer comment: min/guard for safety    Balance Overall  balance assessment: Needs assistance Sitting-balance support: Feet supported Sitting balance-Leahy Scale: Fair     Standing balance support: Single extremity supported;During functional activity;Bilateral upper extremity supported Standing balance-Leahy Scale: Fair                             ADL either performed or assessed with clinical judgement   ADL Overall ADL's : Needs assistance/impaired Eating/Feeding: Independent   Grooming: Min guard;Standing   Upper Body Bathing: Set up   Lower Body Bathing: Minimal assistance   Upper Body Dressing : Set up   Lower Body Dressing: Minimal assistance   Toilet Transfer: Min guard;Ambulation;BSC   Toileting- Water quality scientist and Hygiene: Min guard;Sit to/from stand         General ADL Comments: min guard assist for adls for safety.  Pt unpredictable; doesn't remember to call for assistance     Vision         Perception     Praxis      Pertinent Vitals/Pain Pain Assessment: 0-10 Pain Score: 8  Pain Location: primary complaint is her thighs, but reports pain all over Pain Descriptors / Indicators: Sore;Grimacing Pain Intervention(s): Limited activity within patient's tolerance;Monitored during session;RN gave pain meds during session     Hand Dominance     Extremity/Trunk Assessment Upper Extremity Assessment Upper Extremity Assessment: Overall WFL for tasks assessed   Lower Extremity Assessment Lower Extremity Assessment: Overall WFL for tasks assessed;Generalized weakness       Communication Communication Communication: HOH(masks/HEPA filter made it difficult)   Cognition Arousal/Alertness:  Awake/alert Behavior During Therapy: [facility] for tasks assessed/performed;Impulsive Overall Cognitive Status: No family/caregiver present to determine baseline cognitive functioning Area of Impairment: Attention;Following commands;Safety/judgement;Awareness;Memory;Problem solving                    Current Attention Level: sustained Memory: Decreased short-term memory Following Commands: Follows one step commands consistently;Follows multi-step commands with increased time Safety/Judgement: Decreased awareness of safety;Decreased awareness of deficits Awareness: Emergent Problem Solving: Slow processing General Comments: Pt with inconsistencies during session.  Higher level processing difficult for her. Perseverating at times.   General Comments       Exercises     Shoulder Instructions      Home Living Family/patient expects to be discharged to:: Private residence Living Arrangements: Children Available Help at Discharge: Family Type of Home: House Home Access: Stairs to enter Technical brewer of Steps: 2   Home Layout: One level     Bathroom Shower/Tub: Teacher, early years/pre: [place]: Shower seat;Cane - single point   Additional Comments: Pt inconsistent with her answers. Unsure of accuracy of home environment.      Prior Functioning/Environment Level of Independence: Independent                 OT Problem List: Decreased strength;Decreased activity tolerance;Impaired balance (sitting and/or standing);Decreased cognition;Decreased safety awareness;Decreased knowledge of use of DME or AE;Pain      OT Treatment/Interventions: Self-care/ADL training;DME and/or AE instruction;Energy conservation;Patient/family education;Balance training;Therapeutic activities;Cognitive remediation/compensation    OT Goals(Current goals can be found in the care plan section) Acute Rehab OT Goals Patient Stated Goal: walk again OT Goal Formulation: With patient Time For Goal Achievement: 12/05/19 Potential to Achieve Goals: Good ADL Goals Pt Will Transfer to Toilet: with modified independence;ambulating;bedside commode Pt Will Perform Tub/Shower Transfer: Tub transfer;with supervision;ambulating;shower seat Additional ADL Goal #1: pt  will gather clothes and perform adl at supervision level Additional ADL Goal #2: pt will not need any safety cues during adls/toilet transfers  OT Frequency: Min 2X/week   Barriers to D/C:            Co-evaluation PT/OT/SLP Co-Evaluation/Treatment: Yes Reason for Co-Treatment: For patient/therapist safety PT goals addressed during session: Mobility/safety with mobility OT goals addressed during session: ADL's and self-care      AM-PAC OT "6 Clicks" Daily Activity     Outcome Measure Help from another person eating meals?: None Help from another person taking care of personal grooming?: A Little Help from another person toileting, which includes using toliet, bedpan, or urinal?: A Little Help from another person bathing (including washing, rinsing, drying)?: A Little Help from another person to put on and taking off regular upper body clothing?: A Little Help from another person to put on and taking off regular lower body clothing?: A Little 6 Click Score: 19   End of Session    Activity Tolerance: Patient tolerated treatment well Patient left: in chair;with call bell/phone within reach;with chair alarm set  OT Visit Diagnosis: Unsteadiness on feet (R26.81);Muscle weakness (generalized) (M62.81);Cognitive communication deficit (R41.841)                Time: [id number] OT Time Calculation (min): 27 min Charges:  OT General Charges $OT Visit: 1 Visit OT Evaluation $OT Eval Moderate Complexity: 1 Mod  Keanthony Poole S, OTR/L Acute Rehabilitation Services 11/21/2019  Jayceion Lisenby 11/21/2019, 10:47 AM

## 2019-11-21 NOTE — Procedures (Signed)
Patient Name: [name]  MRN: [id number]  Epilepsy Attending: Lora Havens  Referring Physician/Provider: Dr. Wendee Beavers Date: 11/21/2019 Duration: 24.42 minutes  Patient history: 64 year old female with history of breast cancer status postmastectomy, recent brain metastasis in right frontoparietal region, now COVID+ presented with altered mental status.  Level of alertness: Awake  AEDs during EEG study: None  Technical aspects: This EEG study was done with scalp electrodes positioned according to the 10-20 International system of electrode placement. Electrical activity was acquired at a sampling rate of 500Hz  and reviewed with a high frequency filter of 70Hz  and a low frequency filter of 1Hz . EEG data were recorded continuously and digitally stored.   Description: The posterior dominant rhythm consists of 9-10 Hz activity of moderate voltage (25-35 uV) seen predominantly in posterior head regions, symmetric and reactive to eye opening and eye closing.  EEG also showed intermittent generalized 3 to 7 Hz theta-delta slowing.  Hyperventilation and photic stimulation were not performed.          Abnormality -Continuous slow, generalized  IMPRESSION: This study showed evidence of mild diffuse encephalopathy, nonspecific to etiology. No seizures or epileptiform discharges were seen throughout the recording.  Marnesha Gagen [name]

## 2019-11-21 NOTE — Progress Notes (Signed)
HEMATOLOGY-ONCOLOGY PROGRESS NOTE  SUBJECTIVE: This visit was conducted by telephone due to the COVID-19 pandemic and to minimize exposure and reduce the PPE use.  [name] reports that she is feeling better today.  Still notes that she is somewhat confused.  She states that she is not having any shortness of breath or fevers.  She does report a cough though.  She also reports that her legs feel "stiff" and she is try to work with physical therapy on this.  Oncology History  Malignant neoplasm of upper-inner quadrant of right breast in female, estrogen receptor positive ([place])  10/03/2013 Initial Diagnosis   Malignant neoplasm of upper-inner quadrant of right breast in female, estrogen receptor positive ([place])   06/07/2016 -  Chemotherapy   The patient had trastuzumab (HERCEPTIN) 651 mg in sodium chloride 0.9 % 250 mL chemo infusion, 8 mg/kg = 651 mg, Intravenous,  Once, 35 of 35 cycles Dose modification: 440 mg (original dose 6 mg/kg, Cycle 7, Reason: Provider Judgment), 4 mg/kg (original dose 6 mg/kg, Cycle 10, Reason: Provider Judgment), 6 mg/kg (original dose 6 mg/kg, Cycle 17, Reason: Other (see comments), Comment: MD confirmed dose), 4 mg/kg (original dose 6 mg/kg, Cycle 21, Reason: Provider Judgment), 6 mg/kg (original dose 6 mg/kg, Cycle 26, Reason: Other (see comments), Comment: reentered for scanning issues), 6 mg/kg (original dose 6 mg/kg, Cycle 27, Reason: Other (see comments), Comment: reentered to use current herceptin vial of 156m) Administration: 651 mg (06/07/2016), 483 mg (06/28/2016), 483 mg (01/03/2017), 483 mg (08/30/2016), 483 mg (10/11/2016), 440 mg (02/23/2017), 450 mg (03/16/2017), 450 mg (04/06/2017), 450 mg (04/27/2017), 300 mg (05/30/2017), 450 mg (07/04/2017), 450 mg (08/15/2017), 450 mg (09/05/2017), 450 mg (09/27/2017), 525 mg (11/29/2017), 525 mg (12/27/2017), 525 mg (01/24/2018), 525 mg (03/21/2018), 525 mg (04/18/2018), 336 mg (05/24/2018), 525 mg (06/13/2018), 525 mg (07/14/2018), 525 mg  (08/14/2018), 525 mg (09/11/2018), 525 mg (10/23/2018), 525 mg (11/21/2018), 525 mg (12/19/2018), 525 mg (01/16/2019), 525 mg (02/13/2019), 525 mg (03/12/2019), 525 mg (04/09/2019), 525 mg (05/07/2019), 525 mg (06/04/2019), 525 mg (07/02/2019) pertuzumab (PERJETA) 840 mg in sodium chloride 0.9 % 250 mL chemo infusion, 840 mg, Intravenous, Once, 29 of 33 cycles Dose modification: 420 mg (original dose 420 mg, Cycle 8), 420 mg (original dose 420 mg, Cycle 14), 420 mg (original dose 420 mg, Cycle 18) Administration: 840 mg (06/07/2016), 420 mg (06/28/2016), 420 mg (04/06/2017), 420 mg (04/27/2017), 420 mg (07/04/2017), 420 mg (09/27/2017), 420 mg (11/09/2017), 420 mg (11/29/2017), 420 mg (12/27/2017), 420 mg (03/21/2018), 420 mg (04/18/2018), 420 mg (06/13/2018), 420 mg (07/14/2018), 420 mg (08/14/2018), 420 mg (09/11/2018), 420 mg (01/24/2018), 420 mg (10/23/2018), 420 mg (11/21/2018), 420 mg (12/19/2018), 420 mg (01/16/2019), 420 mg (02/13/2019), 420 mg (03/12/2019), 420 mg (04/09/2019), 420 mg (05/07/2019), 420 mg (06/04/2019), 420 mg (07/02/2019), 420 mg (07/30/2019), 420 mg (10/18/2019) trastuzumab-anns (KANJINTI) 525 mg in sodium chloride 0.9 % 250 mL chemo infusion, 6 mg/kg = 525 mg (100 % of original dose 6 mg/kg), Intravenous,  Once, 2 of 6 cycles Dose modification: 6 mg/kg (original dose 6 mg/kg, Cycle 36, Reason: Other (see comments)) Administration: 525 mg (07/30/2019), 525 mg (10/18/2019)  for chemotherapy treatment.    09/11/2018 -  Chemotherapy   The patient had PACLitaxel-protein bound (ABRAXANE) chemo infusion 200 mg, 100 mg/m2 = 200 mg (100 % of original dose 100 mg/m2), Intravenous,  Once, 6 of 11 cycles Dose modification: 100 mg/m2 (original dose 100 mg/m2, Cycle 7) Administration: 200 mg (04/09/2019), 200 mg (05/07/2019), 200 mg (06/04/2019), 200 mg (07/02/2019),  200 mg (07/30/2019), 200 mg (10/18/2019) PACLitaxel-protein bound (ABRAXANE) chemo infusion 200 mg, 100 mg/m2 = 200 mg, Intravenous, Once, 6 of 6 cycles Administration: 200 mg  (10/23/2018), 200 mg (11/21/2018), 200 mg (12/19/2018), 200 mg (01/16/2019), 200 mg (02/13/2019), 200 mg (03/12/2019)  for chemotherapy treatment.    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       REVIEW OF SYSTEMS:   A comprehensive review of systems was noncontributory except as noted in the HPI.   PHYSICAL EXAMINATION: ECOG PERFORMANCE STATUS: 1 - Symptomatic but completely ambulatory  Vitals:   11/20/19 2134 11/21/19 0722  BP: 127/67 (!) 152/88  Pulse: 71 71  Resp: 20 20  Temp: 98.7 F (37.1 C) 98.3 F (36.8 C)  SpO2: 96% 96%   Filed Weights   11/19/19 1805  Weight: 190 lb 4.1 oz (86.3 kg)    Intake/Output from previous day: 01/13 0701 - 01/14 0700 In: 390 [P.O.:240; IV Piggyback:150] Out: 700 [Urine:700]  Exam per primary team.  LABORATORY DATA:  I have reviewed the data as listed CMP Latest Ref Rng & Units 11/21/2019 11/20/2019 11/19/2019  Glucose 70 - 99 mg/dL 141(H) 127(H) 116(H)  BUN 8 - 23 mg/dL '19 10 12  ' Creatinine 0.44 - 1.00 mg/dL 0.83 0.90 0.98  Sodium 135 - 145 mmol/L 139 139 137  Potassium 3.5 - 5.1 mmol/L 4.1 4.1 3.6  Chloride 98 - 111 mmol/L 100 100 97(L)  CO2 22 - 32 mmol/L '30 29 29  ' Calcium 8.9 - 10.3 mg/dL 8.9 8.4(L) 8.5(L)  Total Protein 6.5 - 8.1 g/dL 6.7 6.6 6.9  Total Bilirubin 0.3 - 1.2 mg/dL  0.3 0.6 0.3  Alkaline Phos 38 - 126 U/L 59 61 67  AST 15 - 41 U/L 31 38 42(H)  ALT 0 - 44 U/L '26 24 25    ' Lab Results  Component Value Date   WBC 13.0 (H) 11/21/2019   HGB 10.1 (L) 11/21/2019   HCT 32.4 (L) 11/21/2019   MCV 88.5 11/21/2019   PLT 199 11/21/2019   NEUTROABS 11.5 (H) 11/21/2019    DG Abd 1 View  Result Date: 11/19/2019 CLINICAL DATA:  Abdominal distention EXAM: ABDOMEN - 1 VIEW COMPARISON:  CT 03/01/2010 FINDINGS: Nonobstructive bowel gas pattern. Gas and large stool burden within the colon. No visible free air. IMPRESSION: Large stool burden throughout the colon. No evidence of bowel obstruction. Electronically Signed   By: Rolm Baptise [name]   On: 11/19/2019 22:13   CT Head Wo Contrast  Result Date: 11/19/2019 CLINICAL DATA:  64 year old female with head trauma. History of breast cancer with metastases to the brain. EXAM: CT HEAD WITHOUT CONTRAST CT CERVICAL SPINE WITHOUT CONTRAST TECHNIQUE: Multidetector CT imaging of the head and cervical spine was performed following the standard protocol without intravenous contrast. Multiplanar CT image reconstructions of the cervical spine were also generated. COMPARISON:  Brain MRI dated  09/04/2019 and CT dated 02/06/2018. FINDINGS: CT HEAD FINDINGS Brain: Is defined right frontal lobe mass measuring 18 x 14 mm. There is associated moderate surrounding vasogenic edema. There is edema also in the right temporal lobe likely secondary to underlying mass which is suboptimally seen on this noncontrast CT but in keeping with known history of antacids cyst. There is no mass effect or midline shift. The ventricles and sulci are otherwise appropriate size for patient's age. The gray-white matter discrimination is preserved. There is no acute intracranial hemorrhage. No extra-axial fluid collection. There is an empty sella turcica. Vascular: No hyperdense vessel or unexpected calcification. Skull: Normal. Negative for fracture or focal lesion.  Sinuses/Orbits: Mild mucoperiosteal thickening of paranasal sinuses. No air-fluid level. The mastoid air cells are clear. Other: None CT CERVICAL SPINE FINDINGS Alignment: No acute subluxation. There is straightening of normal cervical lordosis which may be positional or due to muscle spasm. Skull base and vertebrae: No acute fracture. Soft tissues and spinal canal: No prevertebral fluid or swelling. No visible canal hematoma. Disc levels:  Mild degenerative changes. Upper chest: Negative. Other: Partially visualized Port-A-Cath. IMPRESSION: 1. No acute intracranial hemorrhage. 2. Right frontal and right parietal edema as seen on the prior MRI and in keeping with known metastatic disease. 3. No acute/traumatic cervical spine pathology. Electronically Signed   By: Anner Crete [name]   On: 11/19/2019 19:31   CT Cervical Spine Wo Contrast  Result Date: 11/19/2019 CLINICAL DATA:  64 year old female with head trauma. History of breast cancer with metastases to the brain. EXAM: CT HEAD WITHOUT CONTRAST CT CERVICAL SPINE WITHOUT CONTRAST TECHNIQUE: Multidetector CT imaging of the head and cervical spine was performed following the standard protocol without intravenous contrast. Multiplanar CT image reconstructions of the cervical spine were also generated. COMPARISON:  Brain MRI dated 09/04/2019 and CT dated 02/06/2018. FINDINGS: CT HEAD FINDINGS Brain: Is defined right frontal lobe mass measuring 18 x 14 mm. There is associated moderate surrounding vasogenic edema. There is edema also in the right temporal lobe likely secondary to underlying mass which is suboptimally seen on this noncontrast CT but in keeping with known history of antacids cyst. There is no mass effect or midline shift. The ventricles and sulci are otherwise appropriate size for patient's age. The gray-white matter discrimination is preserved. There is no acute intracranial hemorrhage. No extra-axial fluid collection. There is an empty sella  turcica. Vascular: No hyperdense vessel or unexpected calcification. Skull: Normal. Negative for fracture or focal lesion. Sinuses/Orbits: Mild mucoperiosteal thickening of paranasal sinuses. No air-fluid level. The mastoid air cells are clear. Other: None CT CERVICAL SPINE FINDINGS Alignment: No acute subluxation. There is straightening of normal cervical lordosis which may be positional or due to muscle spasm. Skull base and vertebrae: No acute fracture. Soft tissues and spinal canal: No prevertebral fluid or swelling. No visible canal hematoma. Disc levels:  Mild degenerative changes. Upper chest: Negative. Other: Partially visualized Port-A-Cath. IMPRESSION: 1. No acute intracranial hemorrhage. 2. Right frontal and right parietal edema as seen on the prior MRI and in keeping with known metastatic disease. 3. No acute/traumatic cervical spine pathology. Electronically Signed   By: Anner Crete [name]   On: 11/19/2019 19:31   DG Chest Port 1 View  Result Date: 11/19/2019 CLINICAL DATA:  64 year old female with history of cough and hypoxia. Found unresponsive by family. Possible aspiration. EXAM: PORTABLE CHEST 1 VIEW COMPARISON:  Chest x-ray 10/11/2019. FINDINGS: Left internal jugular single-lumen power porta cath with tip terminating in the  right atrium. Mild linear scarring in the left mid lung, unchanged. Lung volumes are low. No consolidative airspace disease. No pleural effusions. No pneumothorax. No pulmonary nodule or mass noted. Pulmonary vasculature and the cardiomediastinal silhouette are within normal limits. Aortic atherosclerosis. Surgical clips in the region of the right axilla, presumably from prior lymph node dissection. IMPRESSION: 1. No radiographic evidence of acute cardiopulmonary disease. 2. Aortic atherosclerosis. Electronically Signed   By: Vinnie Langton [name]   On: 11/19/2019 18:41   EEG adult  Result Date: 11/21/2019 Lora Havens, MD     11/21/2019 11:20 AM Patient Name:  [name] MRN: [id number] Epilepsy Attending: Lora Havens Referring Physician/Provider: Dr. Wendee Beavers Date: 11/21/2019 Duration: 24.42 minutes Patient history: 64 year old female with history of breast cancer status postmastectomy, recent brain metastasis in right frontoparietal region, now COVID+ presented with altered mental status. Level of alertness: Awake AEDs during EEG study: None Technical aspects: This EEG study was done with scalp electrodes positioned according to the 10-20 International system of electrode placement. Electrical activity was acquired at a sampling rate of '500Hz'  and reviewed with a high frequency filter of '70Hz'  and a low frequency filter of '1Hz' . EEG data were recorded continuously and digitally stored. Description: The posterior dominant rhythm consists of 9-10 Hz activity of moderate voltage (25-35 uV) seen predominantly in posterior head regions, symmetric and reactive to eye opening and eye closing.  EEG also showed intermittent generalized 3 to 7 Hz theta-delta slowing.  Hyperventilation and photic stimulation were not performed.        Abnormality -Continuous slow, generalized IMPRESSION: This study showed evidence of mild diffuse encephalopathy, nonspecific to etiology. No seizures or epileptiform discharges were seen throughout the recording. Lora Havens    ASSESSMENT: 64 y.o. [place] woman with stage IV breast cancer manifested chiefly by loco-regional nodal disease (neck, chest) and skin involvement, without liver, bone, or brain metastases documented   (1) Status post right upper inner quadrant lumpectomy and sentinel lymph node sampling 03/04/2002 for 2 separate foci of invasive ductal carcinoma, mpT2 pN1 or stage IIB, both foci grade 3, both estrogen and progesterone receptor positive, both HercepTest 3+, Mib-1 56%  (2) Reexcision for margins 05/27/2002 showed no residual cancer in the breast.  (3) The patient refused adjuvant systemic  therapy.  (4) Adjuvant radiation treatment completed 08/14/2002.  (5) recurrence in the right breast in 02/2004 showing a morphologically different tumor, again grade 3, again estrogen and progesterone receptor negative, with an MIB-1 of 14% and Herceptest 3+.  (5) Between 03/2004 and 07/2004 she received dose dense Doxorubicin/Cyclophosphamide x 4 given with trastuzumab, followed by weekly Docetaxel x 8, again given with trastuzumab.  (6) Right mastectomy 07/13/2004 showed scattered microscopic foci of residual disease over an area  greater than 5 cm. Margins were negative.  (7) Postoperative Docetaxel continued until 09/2004.  (8) Trastuzumab (Herceptin) given 08/2004 through 01/2012 with some brief interruptions.  RECURRENT/ STAGE IV DISEASE December 2006 (9) Isolated right cervical nodal recurrence 10/2005, treated with radiation to the right supraclavicular area (total 41.5 gray) completed 12/29/2005.  (10) Navelbine given together with Herceptin  11/2005 through 03/2006.  (9) Left mastectomy 02/13/2006 for ductal carcinoma in situ, grade 2, estrogen and progesterone receptor negative, with negative margins; 0 of 3 lymph nodes involved  (10) treated with Lapatinib and Capecitabine before 10/2009, for an unclear duration and with unclear results (cannot locate data on chart review).  (11) Status post right supraclavicular lymph node biopsy 09/2010 again positive  for an invasive ductal carcinoma, estrogen and progesterone receptor negative, HER-2 positive by [facility] with a ratio 4.25.  (12) Navelbine given together with Herceptin between 05/2011 and 11/2011.  (13) Carboplatin/ Gemcitabine/ Herceptin given for 2 cycles, in 12/2011 and 01/2012.  (14) TDM-1 ([place]) started 02/2012. Last dose 10/02/2013 after which the patient discontinued treatment at her own discretion. Echo on December 2014 showed a well preserved ejection fraction.  (15) Deep vein thrombosis of the  right upper extremity documented 04/20/2011.  She completed anticoagulant therapy with Coumadin on 03/25/2013.  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 Left chest Port-A-Cath placed on 04/08/2013; being flushed every 6 weeks  RIGHT UPPER EXTREMITY ANGIOSARCOMA VS BREAST CANCER: August 2016 (18) treated at [facility] of [place] August 2016 with paclitaxel, trastuzumab and pertuzumab 1 cycle, afterwards referred to hospice  (19) under the care of hospice of [facility] August 2016 to 05/08/2016, when the patient opted for a second try at chemotherapy  (20) started low-dose Abraxane, trastuzumab and pertuzumab 06/07/2016, to be repeated every 4 weeks.              (a) pretreatment echocardiogram 06/06/2016 showed a 60-65% ejection fraction             (b) significant compliance problems compromise response to treatment             (c)  echocardiogram 02/20/17 LVEF 55-60%             (d) Abraxane discontinued 07/04/2017 because of possible neuropathy symptoms             (e) Abraxane resumed 09/27/2017             (f) echocardiogram 07/20/2017 showed an ejection fraction of 60-65%             (g) echo 01/22/2018 showed an ejection fraction of 50-55%             (h) CT scan of the chest 05/03/2018 shows no disease involving the lungs liver or lymph nodes, with stable subcutaneous masses as previously noted             (I) Abraxane discontinued August 2019 because of concerns regarding neuropathy-- gemcitabine substituted             (j) echocardiogram 08/20/2018 shows an ejection fraction in the 55-60% range (done every 6 months)             (k) gemcitabine discontinued because of multiple symptoms, Abraxane restarted 09/11/2018,  given once every 4 weeks             (l) echocardiogram 07/17/2019 shows a well-preserved ejection fraction at 55-60%  (21) Chronic pain secondary to known metastatic disease             (a) patient signed pain contract 10/19/2016             (b) as of 11/16/2016 receives her pain medications from Dr [name]             (c)  Dr [name] no longer able to prescribe as of February 2019             (  d) all pain medications now through [facility] (as of April 2019)  (22) left breast and left axillary adenopathy noted clinically             (a) left axillary lymph node biopsy on 07/24/2019 benign  BRAIN METASTASES: OCT 2020 (23) frontal and temporal lobe metastases noted on brain MRI 09/04/2019             (a) whole brain radiation started 10/09/2019, 10 doses plan             (b) consider tucatinib for maintenance  (24) restaging PET scan 08/20/2019 shows hypermetabolic adenopathy, subcutaneous malignancy as clinically appreciated, but no liver or lung parenchymal lesions             (a) Abraxane and trastuzumab/Pertuzumab treatments resumed 10/18/2019             (b) echo 07/17/2019 shows an ejection fraction of 55-60%.   PLAN: [name] was brought to the hospital after being unresponsive at home.  She has been having some intermittent confusion which is improving.  She was found to be positive for COVID-19.  She reports that she is not having any shortness of breath but is having a cough.  I note in her chart that they are arranging for outpatient remdesivir starting on 11/22/2019.  5 breast cancer perspective, [name] is now 14 years out from definitive diagnosis of metastatic breast cancer.  Her peripheral disease is well controlled on her current medications, namely anti-HER-2 treatment (trastuzumab and Pertuzumab) and protein bound paclitaxel (Abraxane).  She has been tolerating her treatment well.  She developed brain metastases in late October 2020.  She received  brain irradiation completed on 10/16/2019.  Once Garnet recovers from her COVID-19 infection, we can see her as an outpatient to discuss further systemic treatment.  We can consider tucatinib.   We will follow with you.   LOS: 2 days   [name], DNP, AGPCNP-BC, AOCNP 11/21/19

## 2019-11-21 NOTE — Progress Notes (Signed)
Patient scheduled for outpatient Remdesivir infusion at 4PM on Friday 1/15 and 1PM on Saturday 1/16.  Please advise them to report to [facility] at [address].  Drive to the security guard and tell them you are here for an infusion. They will direct you to the front entrance where we will come and get you.  For questions call [phone].  Thanks

## 2019-11-21 NOTE — Evaluation (Addendum)
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  Was saturating at 82% when EMS arrived.  Received Narcan with improvement in her mental status. Admitted for acute respiratory failure, encephalopathy and COVID-19 infection.  Clinical Impression  Pt admitted with above diagnosis.  Pt currently with functional limitations due to the deficits listed below (see PT Problem List). Pt will benefit from skilled PT to increase their independence and safety with mobility to allow discharge to the venue listed below.  Pt moving at min/guard A level.  Do feel that having a RW for days she is feeling weaker with her medical history would be appropriate.  Will follow acutely, but do not feel she needs any therapy after d/c. Recommend home with family support, RW, and 3-1 BSC.     Follow Up Recommendations No PT follow up    Equipment Recommendations  Rolling walker with 5" wheels;3in1 (PT)    Recommendations for Other Services       Precautions / Restrictions Precautions Precautions: Fall Restrictions Weight Bearing Restrictions: No      Mobility  Bed Mobility Overal bed mobility: Needs Assistance Bed Mobility: Supine to Sit     Supine to sit: HOB elevated;Min guard     General bed mobility comments: Slow transition to EOB with min/guard and verbal cues with HOB elevated  Transfers Overall transfer level: Needs assistance Equipment used: Rolling walker (2 wheeled) Transfers: Sit to/from Stand Sit to Stand: Min guard         General transfer comment: min/guard for  safety  Ambulation/Gait Ambulation/Gait assistance: Min guard Gait Distance (Feet): 20 Feet(x2) Assistive device: Rolling walker (2 wheeled);1 person hand held assist;None Gait Pattern/deviations: Step-through pattern;Decreased stride length;Wide base of support     General Gait Details: Amb with RW to bathroom-offered to not use RW, but she felt she needed it for support. After toileting was able to ambulate in bathroom with HHA and from bathroom door to recliner without UE support.  Stairs            Wheelchair Mobility    Modified Rankin (Stroke Patients Only)       Balance Overall balance assessment: Needs assistance Sitting-balance support: Feet supported Sitting balance-Leahy Scale: Fair     Standing balance support: Single extremity supported;During functional activity;Bilateral upper extremity supported Standing balance-Leahy Scale: Fair                               Pertinent Vitals/Pain Pain Assessment: 0-10 Pain Score: 8  Pain Location: primary complaint is her thighs, but reports pain all over Pain Descriptors / Indicators: Sore;Grimacing Pain Intervention(s): Limited activity within patient's tolerance;Monitored during session;RN gave pain meds during session    [place] expects to be discharged to:: Private residence Living Arrangements: Children Available Help at Discharge: Family Type of Home: House Home Access: Stairs to enter   Technical brewer of Steps: 2 Home Layout: One level Home Equipment: Shower seat;Cane - single point Additional Comments: Pt inconsistent with her answers. Unsure of accuracy of home environment.  Prior Function Level of Independence: Independent               Hand Dominance        Extremity/Trunk Assessment   Upper Extremity Assessment Upper Extremity Assessment: Defer to OT evaluation    Lower Extremity Assessment Lower Extremity Assessment: Overall WFL for tasks  assessed;Generalized weakness       Communication   Communication: No difficulties  Cognition Arousal/Alertness: Awake/alert Behavior During Therapy: WFL for tasks assessed/performed;Impulsive Overall Cognitive Status: No family/caregiver present to determine baseline cognitive functioning Area of Impairment: Attention;Following commands;Safety/judgement;Awareness;Memory;Problem solving                   Current Attention Level: Focused Memory: Decreased short-term memory Following Commands: Follows one step commands consistently;Follows multi-step commands with increased time Safety/Judgement: Decreased awareness of safety;Decreased awareness of deficits Awareness: Emergent Problem Solving: Slow processing General Comments: Pt with inconsistencies during session.  Higher level processing difficult for her. Perseverating at times.      General Comments      Exercises     Assessment/Plan    PT Assessment Patient needs continued PT services  PT Problem List Decreased strength;Decreased activity tolerance;Decreased balance;Decreased mobility;Decreased knowledge of use of DME;Decreased safety awareness       PT Treatment Interventions DME instruction;Gait training;Functional mobility training;Balance training;Therapeutic exercise;Therapeutic activities;Patient/family education    PT Goals (Current goals can be found in the Care Plan section)  Acute Rehab PT Goals Patient Stated Goal: walk again PT Goal Formulation: With patient Time For Goal Achievement: 12/05/19 Potential to Achieve Goals: Good    Frequency Min 3X/week   Barriers to discharge        Co-evaluation PT/OT/SLP Co-Evaluation/Treatment: Yes Reason for Co-Treatment: For patient/therapist safety;Necessary to address cognition/behavior during functional activity PT goals addressed during session: Mobility/safety with mobility;Balance;Proper use of DME         AM-PAC PT "6 Clicks" Mobility   Outcome Measure Help needed turning from your back to your side while in a flat bed without using bedrails?: None Help needed moving from lying on your back to sitting on the side of a flat bed without using bedrails?: None Help needed moving to and from a bed to a chair (including a wheelchair)?: A Little Help needed standing up from a chair using your arms (e.g., wheelchair or bedside chair)?: A Little Help needed to walk in hospital room?: A Little Help needed climbing 3-5 steps with a railing? : A Little 6 Click Score: 20    End of Session Equipment Utilized During Treatment: Gait belt Activity Tolerance: Patient tolerated treatment well Patient left: in chair;with call bell/phone within reach;with chair alarm set;with nursing/sitter in room Nurse Communication: Mobility status PT Visit Diagnosis: Unsteadiness on feet (R26.81);Difficulty in walking, not elsewhere classified (R26.2);Muscle weakness (generalized) (M62.81)    Time: [id number] PT Time Calculation (min) (ACUTE ONLY): 27 min   Charges:   PT Evaluation $PT Eval Moderate Complexity: 1 Mod          Waino Mounsey L. Tamala Julian, [place] Pager [phone] 11/21/2019   Galen Manila 11/21/2019, 10:19 AM

## 2019-11-21 NOTE — TOC Transition Note (Signed)
Transition of Care [facility]) - CM/SW Discharge Note   Patient Details  Name: [name] MRN: [id number] Date of Birth: 1956-10-21  Transition of [facility]) CM/SW Contact:  Ross Ludwig, LCSW Phone Number: 11/21/2019, 3:24 PM   Clinical Narrative:    CSW was informed that patient needed a bedside commode and a front wheel walker.  CSW contacted [employer] and they will provide patient equipment.  CSW signing off for now, patient did not express any other issues or concerns.   Final next level of care: Home/Self Care Barriers to Discharge: Barriers Resolved   Patient Goals and CMS Choice Patient states their goals for this hospitalization and ongoing recovery are:: To return back home      Discharge Placement                       Discharge Plan and Services                DME Arranged: Bedside commode, Walker rolling DME Agency: [employer] Date DME Agency Contacted: 11/21/19 Time DME Agency Contacted: 81 Representative spoke with at DME Agency: [employer]: NA          Social Determinants of Health ([place]) Interventions     Readmission Risk Interventions No flowsheet data found.

## 2019-11-22 ENCOUNTER — Other Ambulatory Visit: Payer: Self-pay

## 2019-11-22 ENCOUNTER — Other Ambulatory Visit: Payer: Medicare HMO | Admitting: Hospice

## 2019-11-22 ENCOUNTER — Ambulatory Visit (HOSPITAL_COMMUNITY)
Admission: RE | Admit: 2019-11-22 | Discharge: 2019-11-22 | Disposition: A | Discharge status: Home / Self Care | Payer: Medicare HMO | Source: Ambulatory Visit | Attending: Pulmonary Disease | Admitting: Pulmonary Disease

## 2019-11-22 VITALS — BP 121/75 | HR 64 | Temp 98.4°F | Resp 20

## 2019-11-22 DIAGNOSIS — Z170 Estrogen receptor positive status [ER+]: Secondary | ICD-10-CM

## 2019-11-22 DIAGNOSIS — U071 COVID-19: Secondary | ICD-10-CM | POA: Diagnosis not present

## 2019-11-22 DIAGNOSIS — Z515 Encounter for palliative care: Secondary | ICD-10-CM

## 2019-11-22 DIAGNOSIS — C50211 Malignant neoplasm of upper-inner quadrant of right female breast: Secondary | ICD-10-CM

## 2019-11-22 MED ORDER — METHYLPREDNISOLONE SODIUM SUCC 125 MG IJ SOLR: 125.0000 mg | Freq: Once | INTRAMUSCULAR | Status: DC | PRN

## 2019-11-22 MED ORDER — FAMOTIDINE IN NACL 20-0.9 MG/50ML-% IV SOLN: 20.0000 mg | Freq: Once | INTRAVENOUS | Status: DC | PRN

## 2019-11-22 MED ORDER — SODIUM CHLORIDE 0.9 % IV SOLN
INTRAVENOUS | Status: AC
  Filled 2019-11-22: qty 20

## 2019-11-22 MED ORDER — ALBUTEROL SULFATE HFA 108 (90 BASE) MCG/ACT IN AERS: 2.0000 | INHALATION_SPRAY | Freq: Once | RESPIRATORY_TRACT | Status: DC | PRN

## 2019-11-22 MED ORDER — SODIUM CHLORIDE 0.9% FLUSH: 10.0000 mL | INTRAVENOUS | Status: DC | PRN

## 2019-11-22 MED ORDER — EPINEPHRINE 0.3 MG/0.3ML IJ SOAJ: 0.3000 mg | Freq: Once | INTRAMUSCULAR | Status: DC | PRN

## 2019-11-22 MED ORDER — SODIUM CHLORIDE 0.9 % IV SOLN: 100.0000 mg | Freq: Once | INTRAVENOUS | Status: DC

## 2019-11-22 MED ORDER — SODIUM CHLORIDE 0.9 % IV SOLN: INTRAVENOUS | Status: DC | PRN

## 2019-11-22 MED ORDER — DIPHENHYDRAMINE HCL 50 MG/ML IJ SOLN: 50.0000 mg | Freq: Once | INTRAMUSCULAR | Status: DC | PRN

## 2019-11-22 MED ORDER — SODIUM CHLORIDE 0.9 % IV SOLN
100.0000 mg | Freq: Once | INTRAVENOUS | Status: DC
  Administered 2019-11-22: 100 mg via INTRAVENOUS

## 2019-11-22 MED ORDER — HEPARIN SOD (PORK) LOCK FLUSH 100 UNIT/ML IV SOLN
500.0000 [IU] | INTRAVENOUS | Status: DC | PRN
  Filled 2019-11-22: qty 5

## 2019-11-22 MED ORDER — ANTICOAGULANT SODIUM CITRATE 4% (200MG/5ML) IV SOLN: 5.0000 mL | Status: DC | PRN

## 2019-11-22 MED ORDER — HEPARIN SOD (PORK) LOCK FLUSH 100 UNIT/ML IV SOLN
500.0000 [IU] | INTRAVENOUS | Status: AC | PRN
  Administered 2019-11-22: 500 [IU]
  Filled 2019-11-22: qty 5

## 2019-11-22 NOTE — Discharge Instructions (Signed)

## 2019-11-22 NOTE — Progress Notes (Addendum)
[facility] Consult Note Telephone: [phone]  Fax: [phone]  PATIENT NAME: [name] DOB: 1956/09/16 MRN: [id number]  PRIMARY CARE PROVIDER:  Dr. Lurline Del REFERRING PROVIDER:  Dr. Sarajane Jews [name] RESPONSIBLE PARTY:  Self [phone]   TELEHEALTH VISIT STATEMENT Due to the COVID-19 crisis, this visit was done via telephone from my office. It was initiated and consented to by this patient and/or family.  RECOMMENDATIONS/PLAN:   Advance Care Planning/Goals of Care: Telehealth Visit consisted of building trust and discussions on Palliative Medicine as specialized medical care for people living with serious illness, aimed at facilitating better quality of life through symptoms relief, assisting with advance care plan and establishing goals of care. Patient is  currently a full code; she endorsed receiving chemotherapy at this time. Ample emotional support provided. Goals of care include to maximize quality of life and symptom management. Symptom management: Ongoing weakness r/t disease burden and chemotherapy. Discussed rests in between activities; she verbalized understanding and said 'I am fine, this is not new". She denied nausea vomiting; pain well managed with current pain regimen. She reported she test positive for COVID-19 and currently on quarantine; no symptoms at this time.  Follow up: Palliative care will continue to follow patient for goals of care clarification and symptom management. In person visit requested and scheduled for 12/25/2019 I spent 60  minutes providing this consultation. Time including chart review and documentation.  More than 50% of the time in this consultation was spent on coordinating communication  HISTORY OF PRESENT ILLNESS:  [name] is a 65 y.o. year old female with multiple medical problems including Stage 4 estrogen receptor breast CA, angiosarcoma, HTN,  Palliative Care was asked to help  address goals of care.   CODE STATUS: Full  PPS: 60% HOSPICE ELIGIBILITY/DIAGNOSIS: TBD  PAST MEDICAL HISTORY:  Past Medical History:  Diagnosis Date  . Breast cancer ([place])   . DVT (deep venous thrombosis) ([place]) 10/07/2011  . Fever 02/06/2018  . Hypertension   . Radiation 11/29/2005-12/29/2005   right supraclavicular area 4147 cGy  . Radiation 06/26/02-08/14/02   right breast 5040 cGy, tumor bed boosted to 1260 cGy  . Rheumatic fever     SOCIAL HX:  Social History   Tobacco Use  . Smoking status: Light Tobacco Smoker  . Smokeless tobacco: Never Used  Substance Use Topics  . Alcohol use: Yes    Comment: Rarely    ALLERGIES:  Allergies  Allergen Reactions  . Tramadol Nausea Only  . Hydrocodone-Acetaminophen Itching and Rash  . Pork-Derived Products Other (See Comments)    Pt says she just doesn't eat pork; has no reaction to pork products     PERTINENT MEDICATIONS:  Outpatient Encounter Medications as of 11/22/2019  Medication Sig  . ALPRAZolam (XANAX) 1 MG tablet Take 1 mg by mouth at bedtime as needed for sleep.   . Ascorbic Acid (VITAMIN C PO) Take 1 tablet by mouth daily.  . cholecalciferol (VITAMIN D) 1000 units tablet Take 1 tablet (1,000 Units total) by mouth daily. (Patient not taking: Reported on 11/19/2019)  . dexamethasone (DECADRON) 4 MG tablet Take 2 mg by mouth 2 (two) times daily.   Marland Kitchen gabapentin (NEURONTIN) 600 MG tablet Take 600 mg by mouth 3 (three) times daily.   . hydrocortisone (CORTEF) 20 MG tablet as directed.  . magic mouthwash SOLN Take 10 mLs by mouth 4 (four) times daily -  before meals and at bedtime. Swish and spit.  Marland Kitchen  methadone (DOLOPHINE) 10 MG tablet Take 1 tablet (10 mg total) by mouth 3 (three) times daily. (Patient taking differently: Take 10 mg by mouth daily. )  . Multiple Vitamin (MULTIVITAMIN) capsule Take 1 capsule by mouth daily.    . naloxone (NARCAN) nasal spray 4 mg/0.1 mL Administer single spray of naloxone into one nostril.  Additionally, spray can be given every 2-3 minutes as needed until emergency medical assistance arrives. Call 911 immediately after use.  Marland Kitchen Oxycodone HCl 20 MG TABS Take 20 mg by mouth 3 (three) times daily as needed (pain).   . polyethylene glycol (MIRALAX / GLYCOLAX) 17 g packet Take 17 g by mouth daily as needed for mild constipation.  . potassium chloride SA (KLOR-CON) 20 MEQ tablet Take 20 mEq by mouth daily.  . prochlorperazine (COMPAZINE) 10 MG tablet Take 1 tablet (10 mg total) by mouth every 6 (six) hours as needed for nausea or vomiting.  . senna-docusate (SENOKOT-S) 8.6-50 MG tablet Take 1 tablet by mouth 2 (two) times daily as needed for moderate constipation.  . sertraline (ZOLOFT) 100 MG tablet Take 1 tablet (100 mg total) by mouth daily.  Marland Kitchen triamterene-hydrochlorothiazide (DYAZIDE) 37.5-25 MG capsule Take 1 each (1 capsule total) by mouth every morning.  . tucatinib (TUKYSA) 150 MG tablet Take 2 tablets (300 mg total) by mouth every morning. Take as directed by MD.   Facility-Administered Encounter Medications as of 11/22/2019  Medication  . sodium chloride 0.9 % injection 10 mL  . sodium chloride flush (NS) 0.9 % injection 10 mL   Teodoro Spray, NP

## 2019-11-22 NOTE — Progress Notes (Signed)
  Diagnosis: COVID-19  Physician:  Procedure: [facility] Med: remdesivir infusion.  Complications: No immediate complications noted.  Discharge: Discharged home   [name] 11/22/2019

## 2019-11-23 ENCOUNTER — Ambulatory Visit (HOSPITAL_COMMUNITY): Payer: Medicare HMO

## 2019-11-24 ENCOUNTER — Ambulatory Visit (HOSPITAL_COMMUNITY)
Admission: RE | Admit: 2019-11-24 | Discharge: 2019-11-24 | Disposition: A | Discharge status: Home / Self Care | Payer: Medicare HMO | Source: Ambulatory Visit | Attending: Pulmonary Disease | Admitting: Pulmonary Disease

## 2019-11-24 DIAGNOSIS — J1289 Other viral pneumonia: Secondary | ICD-10-CM | POA: Diagnosis not present

## 2019-11-24 DIAGNOSIS — U071 COVID-19: Secondary | ICD-10-CM | POA: Insufficient documentation

## 2019-11-24 LAB — CULTURE, BLOOD (ROUTINE X 2)
Culture: NO GROWTH
Culture: NO GROWTH
Special Requests: ADEQUATE

## 2019-11-24 MED ORDER — SODIUM CHLORIDE 0.9 % IV SOLN
INTRAVENOUS | Status: AC
  Filled 2019-11-24: qty 20

## 2019-11-24 MED ORDER — SODIUM CHLORIDE 0.9 % IV SOLN: INTRAVENOUS | Status: DC | PRN

## 2019-11-24 MED ORDER — DIPHENHYDRAMINE HCL 50 MG/ML IJ SOLN: 50.0000 mg | Freq: Once | INTRAMUSCULAR | Status: DC | PRN

## 2019-11-24 MED ORDER — FAMOTIDINE IN NACL 20-0.9 MG/50ML-% IV SOLN: 20.0000 mg | Freq: Once | INTRAVENOUS | Status: DC | PRN

## 2019-11-24 MED ORDER — HEPARIN SOD (PORK) LOCK FLUSH 100 UNIT/ML IV SOLN
500.0000 [IU] | Freq: Once | INTRAVENOUS | Status: AC
  Administered 2019-11-24: 500 [IU] via INTRAVENOUS
  Filled 2019-11-24: qty 5

## 2019-11-24 MED ORDER — METHYLPREDNISOLONE SODIUM SUCC 125 MG IJ SOLR: 125.0000 mg | Freq: Once | INTRAMUSCULAR | Status: DC | PRN

## 2019-11-24 MED ORDER — EPINEPHRINE 0.3 MG/0.3ML IJ SOAJ: 0.3000 mg | Freq: Once | INTRAMUSCULAR | Status: DC | PRN

## 2019-11-24 MED ORDER — ALBUTEROL SULFATE HFA 108 (90 BASE) MCG/ACT IN AERS: 2.0000 | INHALATION_SPRAY | Freq: Once | RESPIRATORY_TRACT | Status: DC | PRN

## 2019-11-24 MED ORDER — SODIUM CHLORIDE 0.9 % IV SOLN
100.0000 mg | Freq: Once | INTRAVENOUS | Status: AC
  Administered 2019-11-24: 100 mg via INTRAVENOUS

## 2019-11-24 NOTE — Progress Notes (Signed)
  Diagnosis: COVID-19  Physician: Dr. Joya Gaskins  Procedure: Cov[facility] Infusion Clinic Med: remdesivir infusion.  Complications: No immediate complications noted.  Discharge: Discharged home   [name] 11/24/2019

## 2019-11-27 ENCOUNTER — Telehealth: Payer: Self-pay | Admitting: Oncology

## 2019-11-27 ENCOUNTER — Telehealth: Payer: Self-pay | Admitting: *Deleted

## 2019-11-27 NOTE — Telephone Encounter (Signed)
This RN spoke with pt per her call stating " I don't know what to do ".  This RN discussed recent Covid positive status with [place] stating she is quanentining at home appropriately.  She states she is not taking the Tukysa at this time and that she was called by the pharmacy for a refill and informed them not to refill them at this time.  This RN informed pt a request for an appointment for the week of 12/16/2019 has been sent - and per recent Covid diagnosis pt need to recover as well as per office protocol pt can not come into this office for 21 days post diagnosis.  [place] stating appreciation of above

## 2019-11-27 NOTE — Telephone Encounter (Signed)
No entry 

## 2019-11-27 NOTE — Telephone Encounter (Signed)
Scheduled appt per 1/19 sch message - pt is aware of appt date and time   

## 2019-12-06 ENCOUNTER — Telehealth: Payer: Self-pay | Admitting: Oncology

## 2019-12-06 NOTE — Telephone Encounter (Signed)
R/s appt per 1/22 sch message - unable to reach pt  . Left message with apt date and time

## 2019-12-07 IMAGING — US US AXILLARY LEFT
1 series · 13 of 16 positions shown · non-contrast
Comparison: PET-CT dated 02/25/2014 and CT chest dated 05/03/2018.

CLINICAL DATA: Female here for evaluation of a palpable area
concern in the left axilla felt by the patient's clinician. The
patient has a history of right invasive ductal carcinoma stage IV
with multiple instances of recurrences, ultimately undergoing right
breast mastectomy in 5337. Patient has history of left breast ductal
carcinoma in situ status post mastectomy in 7339. Patient also has a
history of right upper extremity angiosarcoma diagnosed in 2401.

EXAM:
ULTRASOUND OF THE LEFT AXILLA

[Series 1: us axillary left · 0.06mm/px · 13 of 16 slices shown]
[im 1/16]
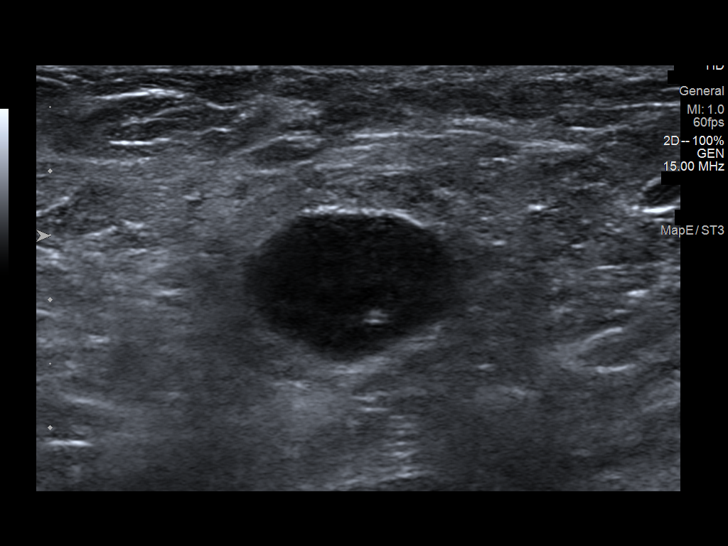
[im 2/16]
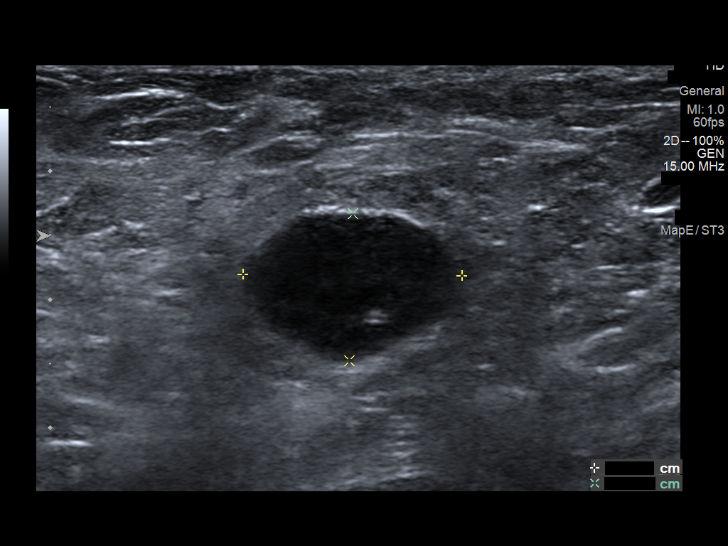
[im 4/16]
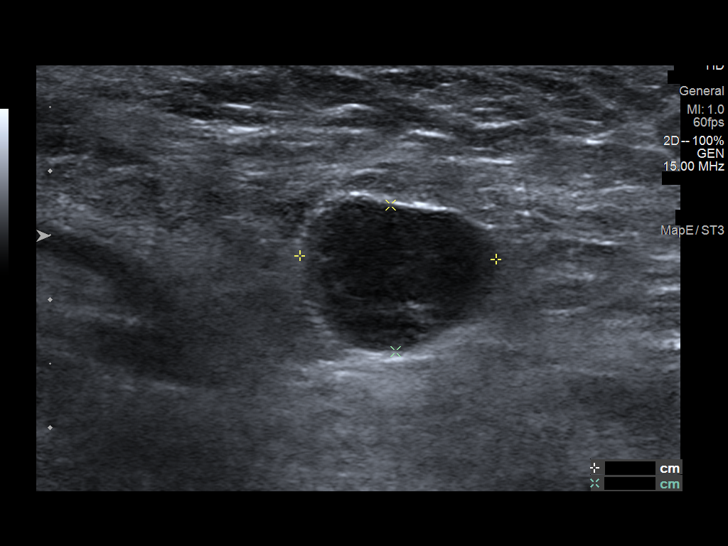
[im 5/16]
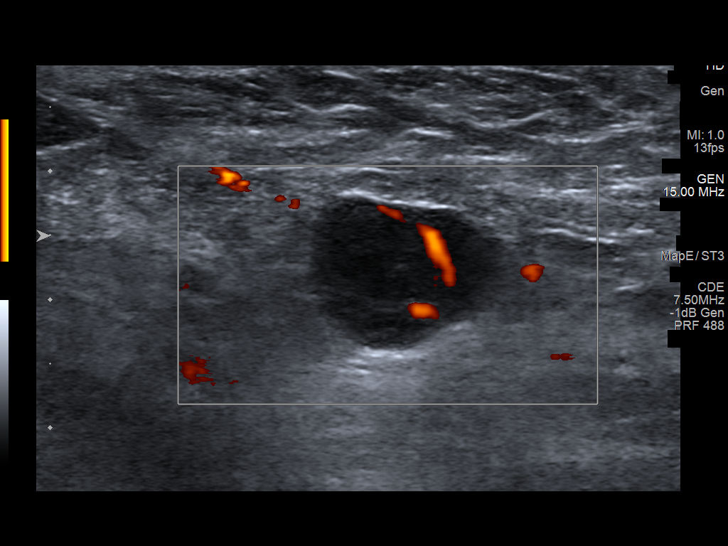
[im 6/16]
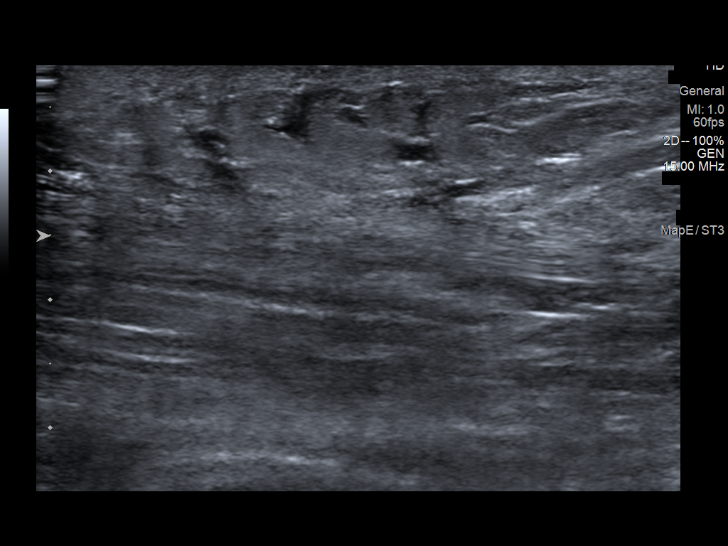
[im 7/16]
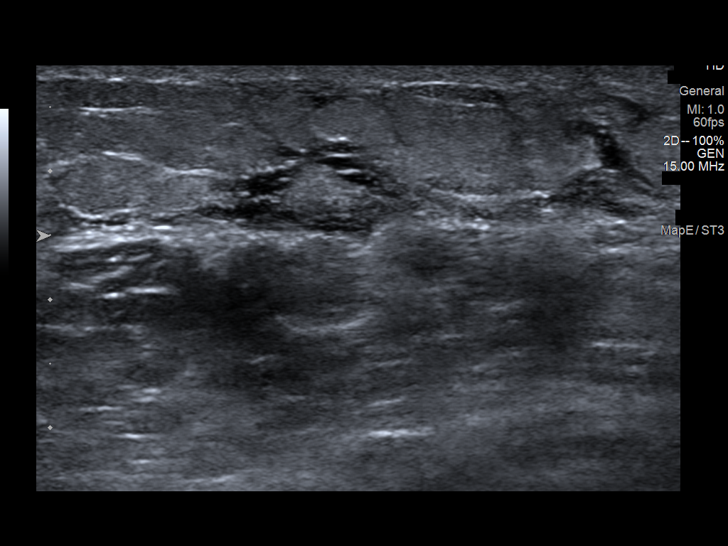
[im 9/16]
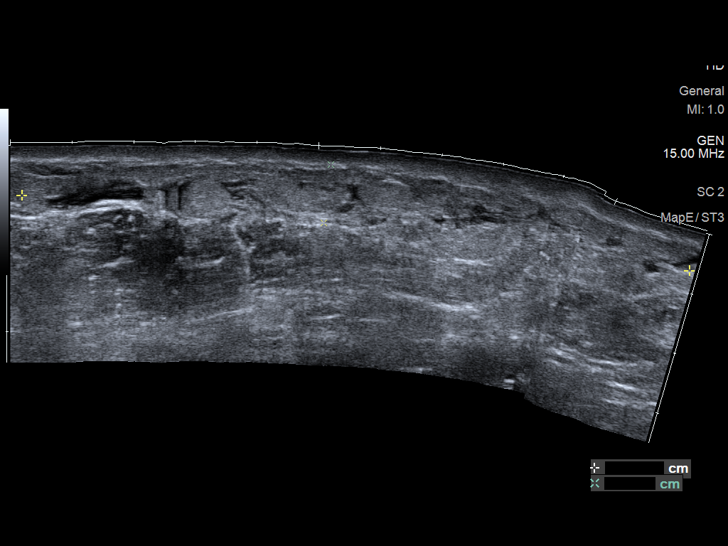
[im 10/16]
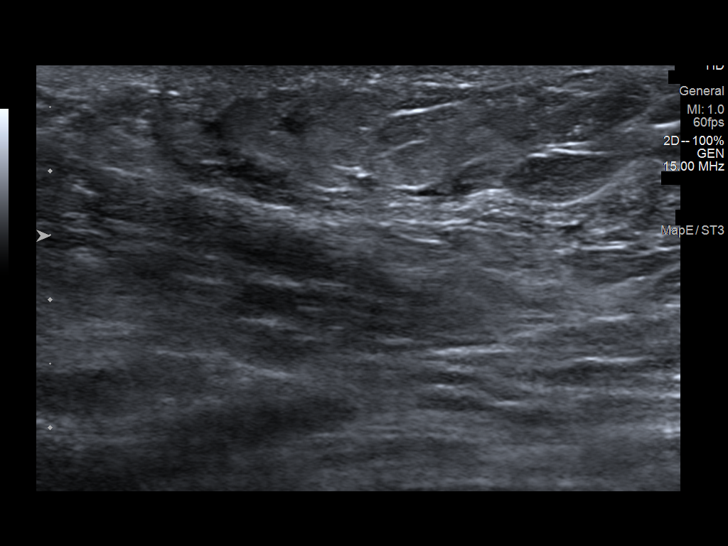
[im 11/16]
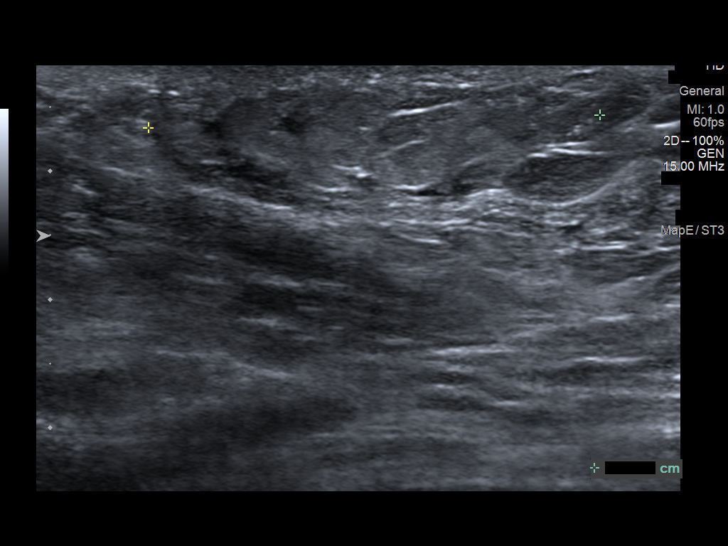
[im 12/16]
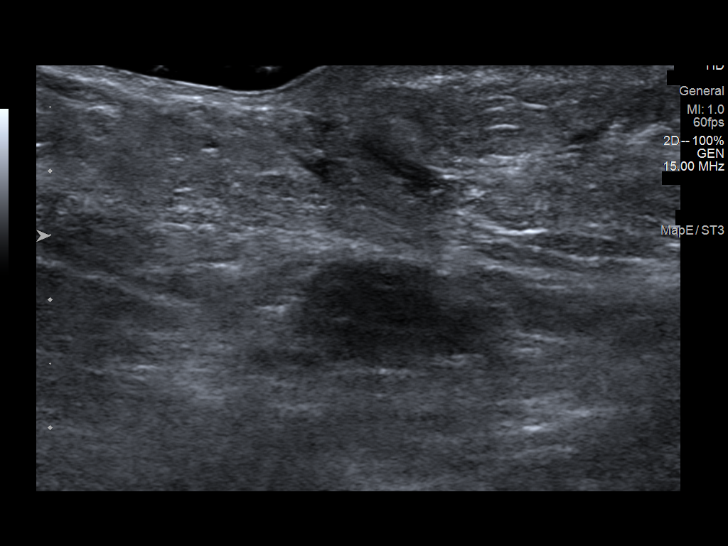
[im 13/16]
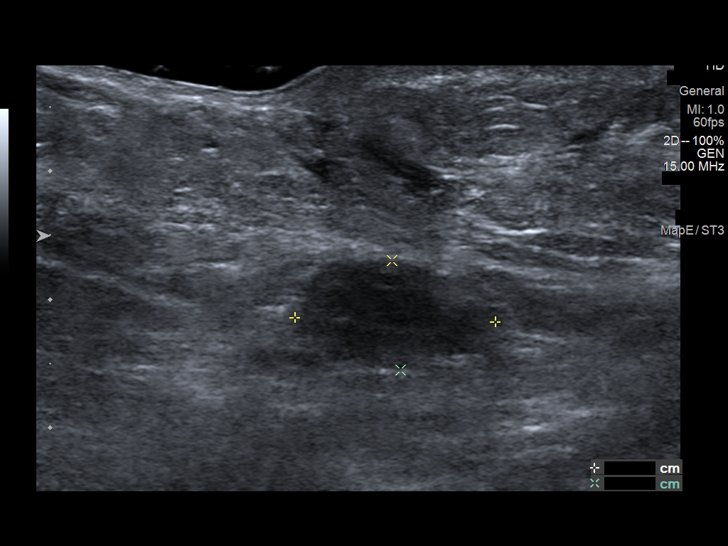
[im 15/16]
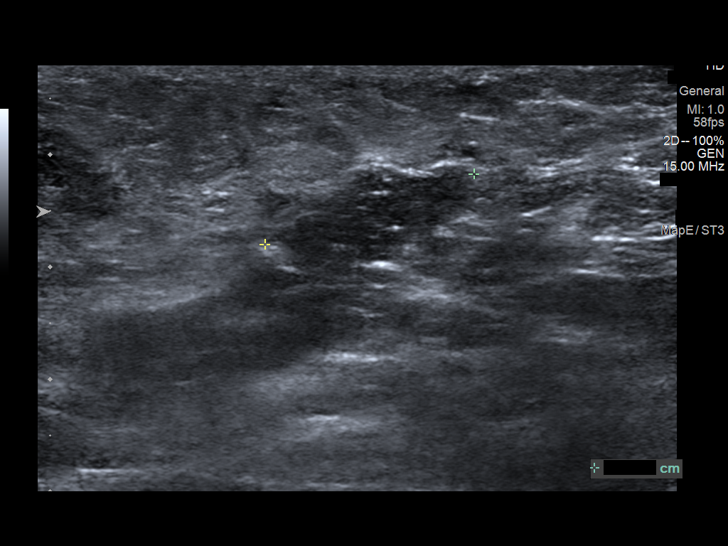
[im 16/16]
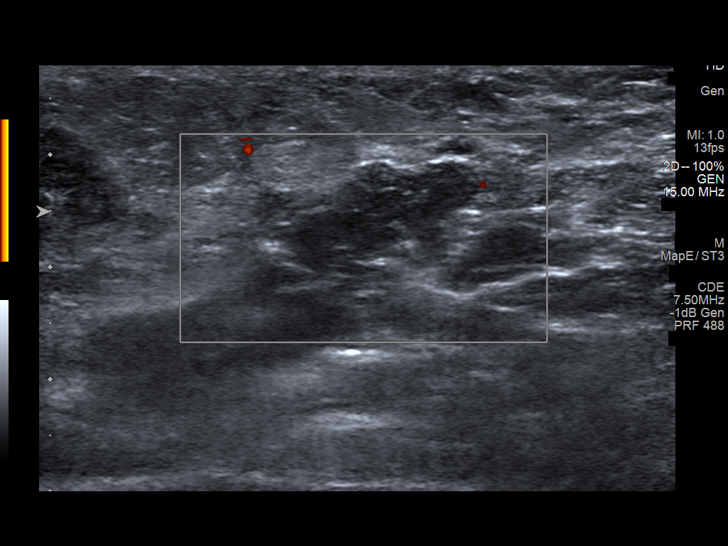

[13 of 16 positions shown; findings below may reference images not displayed]

FINDINGS: Targeted left axillary ultrasound was performed by the physician in
the area of concern indicated by the patient.

An abnormally enlarged lymph node in the low left axilla measures
1.7 cm in greatest dimension. This likely does not correspond to the
palpable area of concern.

There is subcutaneous edema in the upper outer left breast which
spans approximately 11 cm and is centered at the 1 o'clock position
10 cm from the nipple. This area is firm and nonmobile, likely
corresponding to the palpable area felt by the patient's clinician.

Inferior to this area is an irregular indistinct hypoechoic mass at
2 o'clock 8 cm from the nipple measuring 1.6 x 0.9 x 2.0 cm. This is
near the patient's operative site from prior mastectomy and precise
anatomical delineation of his location is difficult, but may be
located within the inferolateral pectoralis muscle or the left
serratus anterior muscle. This mass is located deep beneath the scan
and is less amenable to biopsy than the left axillary lymph node.
IMPRESSION: Enlarged left axillary lymph node is suspicious for recurrent
malignancy. An irregular mass in the lateral, posterior left breast
which may be located in the inferolateral pectoralis muscle or the
left serratus anterior muscle and is suspicious for malignancy.

RECOMMENDATION:
Recommend ultrasound-guided biopsy of the enlarged left axillary
lymph node. Pending biopsy results, additional imaging such as
breast MR or PET-CT could be considered for further evaluation of
the irregular mass in the lateral left breast given that this area
is difficult to assess by ultrasound.

I have discussed the findings and recommendations with the patient.
If applicable, a reminder letter will be sent to the patient
regarding the next appointment.

BI-RADS CATEGORY  4: Suspicious.

## 2019-12-07 IMAGING — US US AXILLARY NODE CORE BIOPSY LEFT
1 series · 8 of 8 positions shown · non-contrast
Comparison: Previous exam(s).
COMPARISON: Previous exam(s).

Addendum:
CLINICAL DATA: Patient with thickened left axillary lymph node.

EXAM:
US AXILLARY NODE CORE BIOPSY LEFT

[Series 1: us axillary node core biopsy left · 0.07mm/px · 8 of 8 slices shown]
[im 1/8]
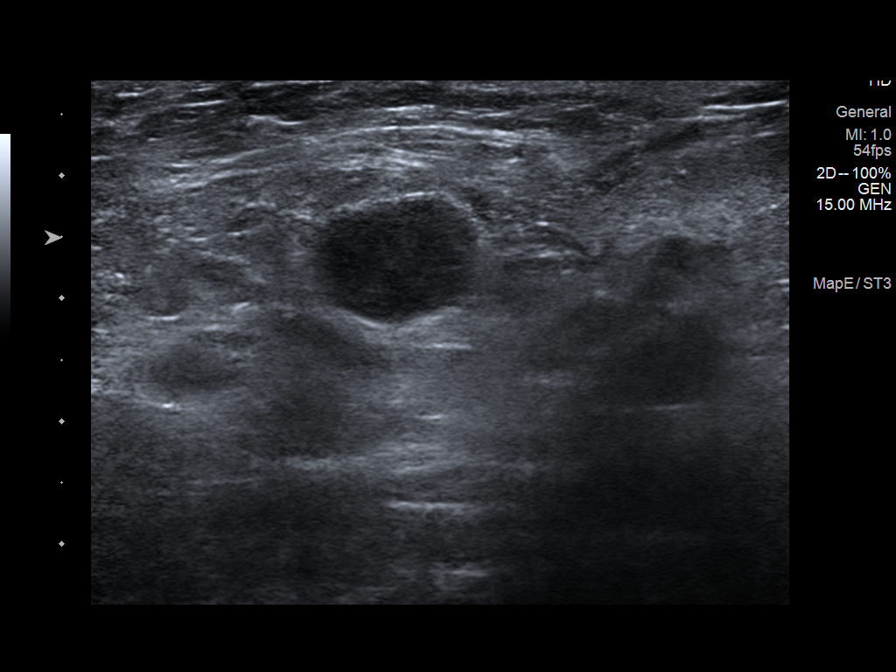
[im 2/8]
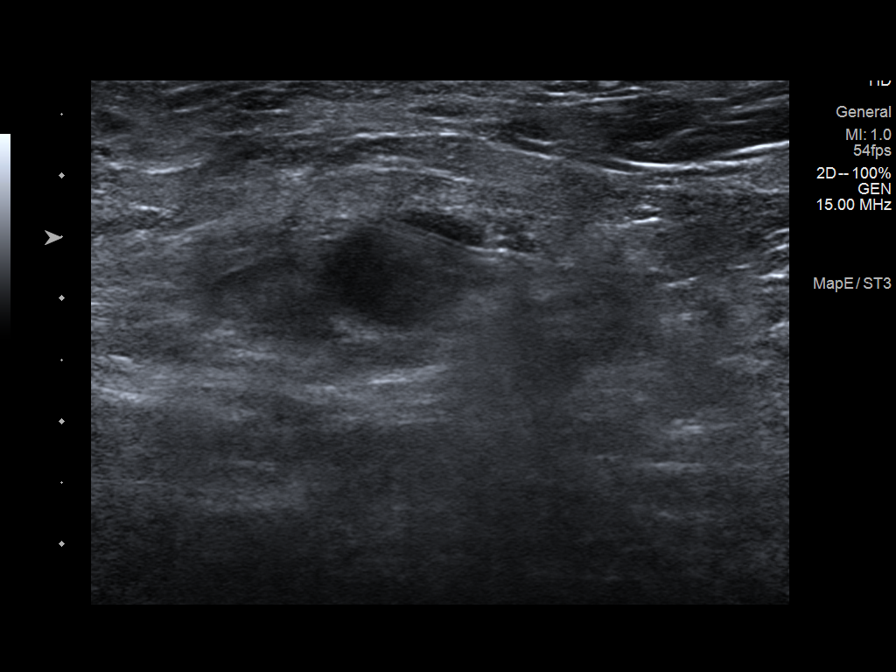
[im 3/8]
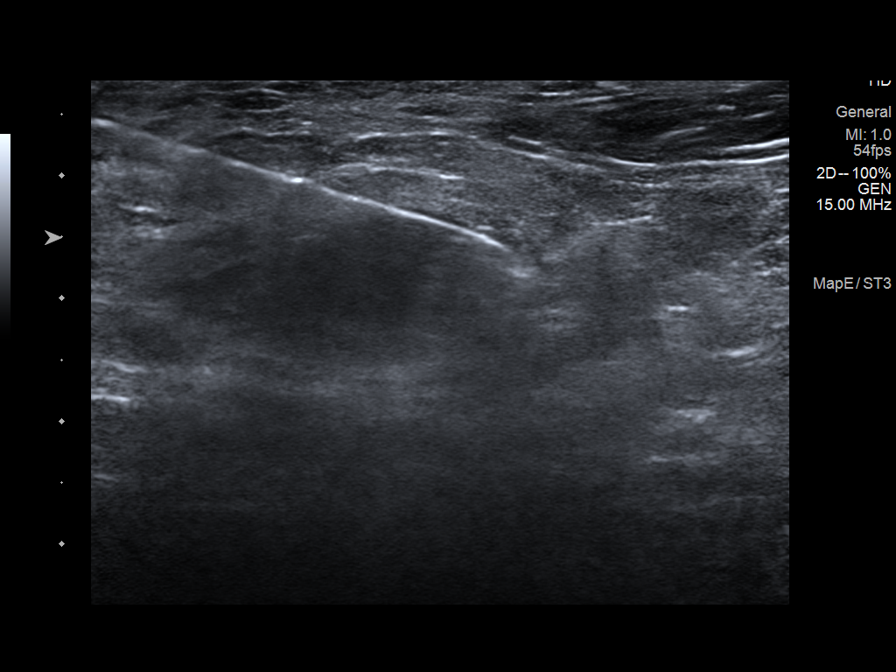
[im 4/8]
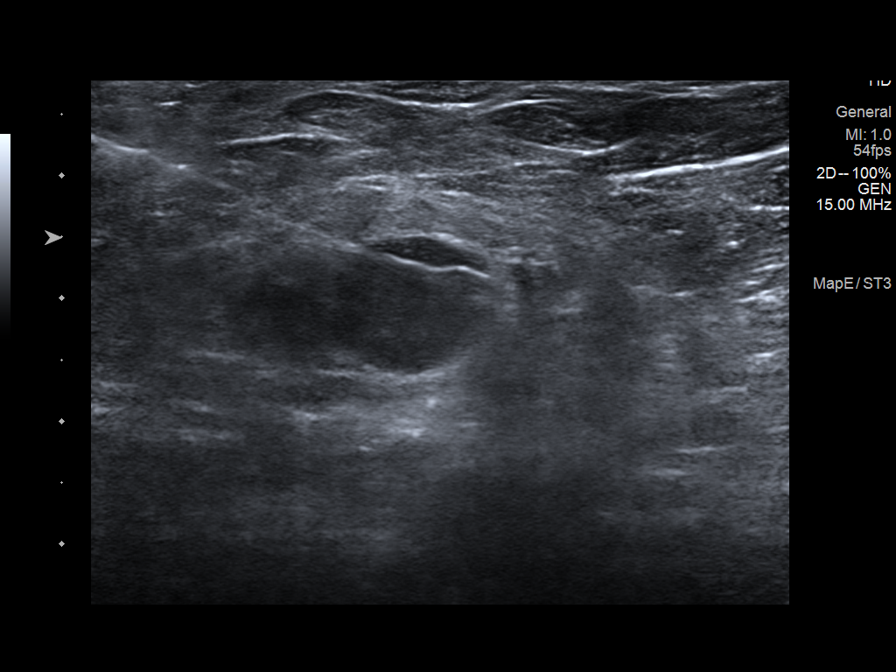
[im 5/8]
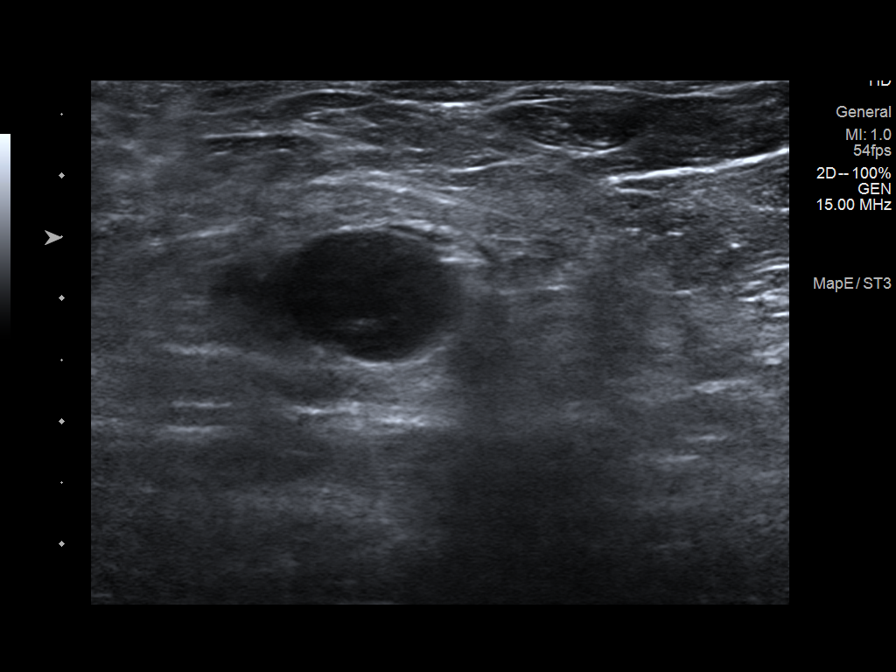
[im 6/8]
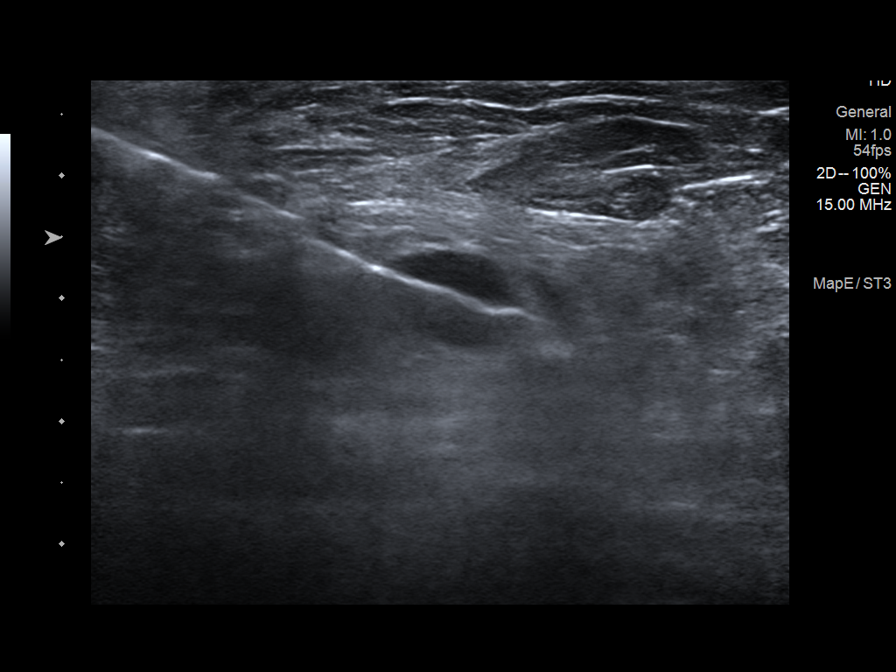
[im 7/8]
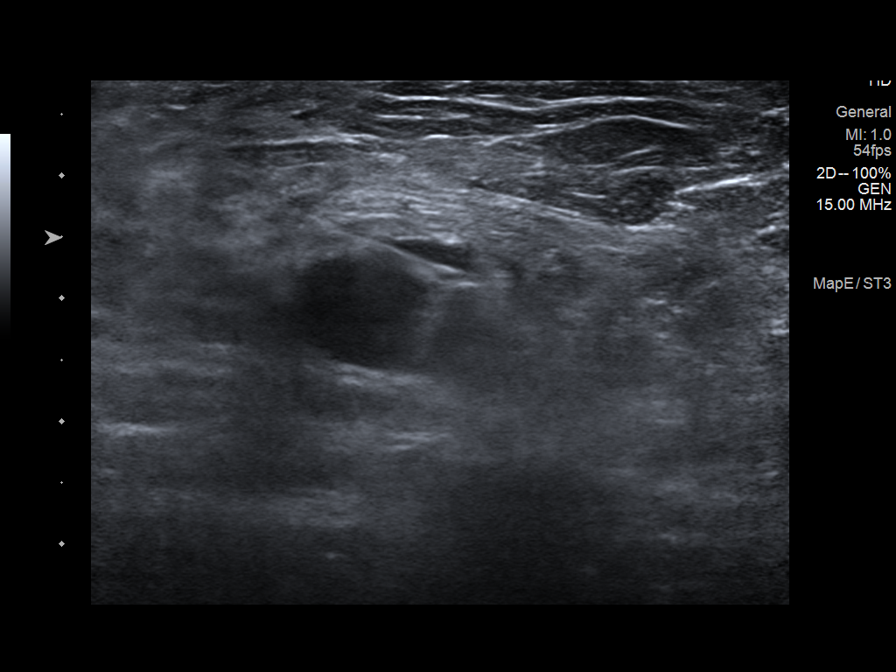
[im 8/8]
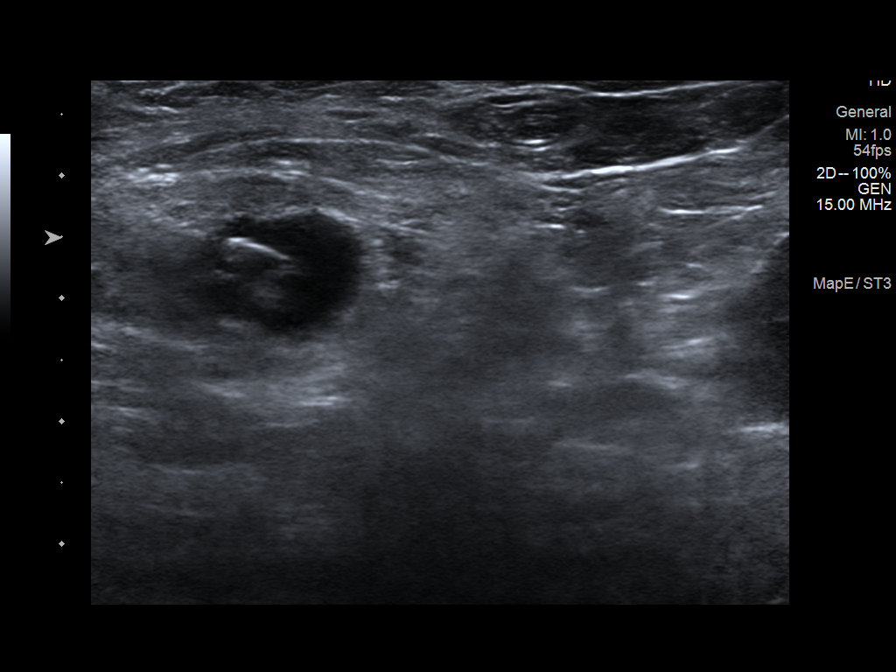

[8 of 8 positions shown; findings below may reference images not displayed]



Using sterile technique and 1% Lidocaine as local anesthetic, under
direct ultrasound visualization, a 14 gauge Jeo device was
used to perform biopsy of thickened left axillary lymph node using a
lateral approach. At the conclusion of the procedure a HydroMARK
tissue marker clip was deployed into the biopsy cavity. Follow up 2
view mammogram was performed and dictated separately.
IMPRESSION: Ultrasound guided biopsy of thickened left axillary lymph node. No
apparent complications.

ADDENDUM:
Pathology revealed BENIGN LYMPH NODE of the Left axilla. This was
found to be concordant by Dr. Polin Billiot.

Pathology results were discussed with the patient by telephone. The
patient reported doing well after the biopsy with tenderness at the
site. Post biopsy instructions and care were reviewed and questions
were answered. The patient was encouraged to call The [REDACTED]

The patient has a history of right invasive ductal carcinoma stage
IV with multiple instances of recurrences, ultimately undergoing
right breast mastectomy in 6003. Patient has history of left breast
ductal carcinoma in situ status post mastectomy in 5225. Patient
also has a history of right upper extremity angiosarcoma diagnosed
in 2901. Therefore clinical followup is recommended.

The patient is scheduled to see Dr. Sivakumar Provost at [HOSPITAL]
[HOSPITAL] on July 30, 2019.

Dr. Sivakumar Provost was notified of biopsy results via [REDACTED] message on
July 29, 2019.

Pathology results reported by Benhamida Annaba, RN on 07/29/2019.



Using sterile technique and 1% Lidocaine as local anesthetic, under
direct ultrasound visualization, a 14 gauge Jeo device was
used to perform biopsy of thickened left axillary lymph node using a
lateral approach. At the conclusion of the procedure a HydroMARK
tissue marker clip was deployed into the biopsy cavity. Follow up 2
view mammogram was performed and dictated separately.
IMPRESSION: Ultrasound guided biopsy of thickened left axillary lymph node. No
apparent complications.

## 2019-12-16 ENCOUNTER — Other Ambulatory Visit: Payer: Self-pay | Admitting: Radiation Therapy

## 2019-12-17 ENCOUNTER — Ambulatory Visit: Payer: Medicare HMO | Admitting: Oncology

## 2019-12-17 ENCOUNTER — Other Ambulatory Visit: Payer: Medicare HMO

## 2019-12-17 NOTE — Progress Notes (Signed)
[name]  [phone]) [phone] [phone]) [phone]     ID: [name]   DOB: Oct 20, 1956  MR#: [id number]  NLG#:[id number]  Patient Care Team: Patient, No Pcp Per as PCP - General (General Practice) Huntleigh Doolen, [name], MD as Consulting Physician (Oncology) [name], MD as Consulting Physician (Radiation Oncology) [name], [name], MD as Consulting Physician (Cardiology) [name] [name], MD as Consulting Physician (Psychiatry) [name], [name] 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  Since her last visit, she was found unresponsive by family members on 11/19/2019, likely due to opiates . EMS was called, and her mental status improved after receiving Narcan. On arrival to the ED, she was found to be positive for Covid-19. She was admitted for acute respiratory failure, encephalopathy, and the Covid infection. She improved with decadron and remdesivir, and she was discharged on 11/21/2019. She received another two doses of the remdesivir as outpatient on 1/15 and 11/24/2019.  She returns today to resume her treatment for systemic disease.  She is already scheduled for brain MRI on 2020-01-07 to follow-up on her brain metastases.   REVIEW OF SYSTEMS: Akyia denies unusual headaches visual changes nausea or vomiting.  She denies recent falls although she says she has rolled out of bed a couple of times in the last month.  She is very hoarse today.  She has not had fevers or cough and quarantine herself in her house by staying in her room and wearing a mask and not letting anybody else get in there.  She says no one else in her house was infected with COVID-19.  She is not taking tucatinib at  this point.  She is taking a variety of pain pills very irregularly.  She is currently not constipated.  She complains of pain in her right shoulder and mid back.  A detailed review of systems today was stable  BREAST CANCER HISTORY: From the original intake note:  The patient originally presented in May 2003 when she noticed a lump in the upper inner quadrant of her right breast in September 2003.  She sought attention and had a mammogram which showed an obvious carcinoma in the upper inner quadrant of the right breast, approximately 2 cm.  There were some enlarged lymph nodes in the axilla and an FNA done showed those consistent with malignant cells, most likely an invasive ductal carcinoma.  At that point she was unsure of what to do and was referred by Dr. [name] for a discussion of her treatment options.  By biopsy, it was ER/PR negative and HER-2 was 3+.  DNA index was 1.42.  We reiterated that it was most important to have her disease surgically addressed at that point and had recommended lumpectomy and axillary nodes to be addressed with which she followed through and on 04-03-02, Dr. [name] performed a right partial mastectomy and a right axillary lymph node dissection.  Final pathology revealed a 2.4 cm. high grade, Grade III invasive ductal carcinoma with an adjacent .8 cm. also high grade invasive ductal carcinoma which was felt to represent an intramammary metastasis rather than a second primary.  A smaller mass was just medial to the larger mass.  The smaller mass was associated with high grade DCIS component.  There was  no definite lymphovascular invasion identified.  However, one of fourteen axillary lymph nodes did contain a 1.5 cm. metastatic deposit.  Postoperatively she did very well.  We reiterated the fact that she did need adjuvant chemotherapy, however, she refused and decided that she would pursue radiation.  She received radiation and completed that on 08-14-02. Her subsequent  history is as detailed below.   PAST MEDICAL HISTORY: Past Medical History:  Diagnosis Date  . Breast cancer ([place])   . DVT (deep venous thrombosis) ([place]) 10/07/2011  . Fever 02/06/2018  . Hypertension   . Radiation 11/29/2005-12/29/2005   right supraclavicular area 4147 cGy  . Radiation 06/26/02-08/14/02   right breast 5040 cGy, tumor bed boosted to 1260 cGy  . Rheumatic fever     PAST SURGICAL HISTORY: Past Surgical History:  Procedure Laterality Date  . BREAST SURGERY    . MASTECTOMY Bilateral   . PORT A CATH REVISION      FAMILY HISTORY Family History  Problem Relation Age of Onset  . Pancreatic cancer Mother   . Kidney cancer Sister 40  . Breast cancer Sister 69  . Breast cancer Other 9  The patient's father died in his 81X  from complications of alcohol abuse. The patient's mother died in her 89s from pancreatic cancer. The patient has 6 brothers, 2 sisters. Both her sisters have had breast cancer, both diagnosed after the age of 67. The patient has not had genetic testing so far.   GYNECOLOGIC HISTORY: Menarche age 8; first live birth age 63. She is GXP4. She is not sure when she stopped having periods. She never used hormone replacement.   SOCIAL HISTORY: The patient has worked in the past as a Psychologist, counselling. At home in addition to the patient are her husband [name], originally from [place], who works as a Administrator.  Also living in the home is her daughter [name] (she is studying to be a Marine scientist) and grandson.  The other 2 children are [name], who has a college degree in psychology and works as a Probation officer; and [name], who lives in [place] and works as a Higher education careers adviser. [name] also sometimes stays in the home. In addition the patient has an aide who helps her almost daily.    ADVANCED DIRECTIVES: Not in place   HEALTH MAINTENANCE: Social History   Tobacco Use  . Smoking status: Light Tobacco Smoker  . Smokeless tobacco: Never Used  Substance Use Topics    . Alcohol use: Yes    Comment: Rarely  . Drug use: No     Colonoscopy:  Not on file  PAP:  Not on file  Bone density:  Not on file  Lipid panel:  Not on file   Allergies  Allergen Reactions  . Tramadol Nausea Only  . Hydrocodone-Acetaminophen Itching and Rash  . Pork-Derived Products Other (See Comments)    Pt says she just doesn't eat pork; has no reaction to pork products    Current Outpatient Medications  Medication Sig Dispense Refill  . ALPRAZolam (XANAX) 1 MG tablet Take 1 mg by mouth at bedtime as needed for sleep.     . Ascorbic Acid (VITAMIN C PO) Take 1 tablet by mouth daily.    . cholecalciferol (VITAMIN D) 1000 units tablet Take 1 tablet (1,000 Units total) by mouth daily. (Patient not taking: Reported on 11/19/2019) 90 tablet 4  . dexamethasone (DECADRON) 4 MG tablet Take 2 mg by mouth 2 (two) times daily.  60 tablet 0  .  gabapentin (NEURONTIN) 600 MG tablet Take 600 mg by mouth 3 (three) times daily.     . hydrocortisone (CORTEF) 20 MG tablet as directed.    . magic mouthwash SOLN Take 10 mLs by mouth 4 (four) times daily -  before meals and at bedtime. Swish and spit.    . methadone (DOLOPHINE) 5 MG tablet Take 1 tablet (5 mg total) by mouth every 8 (eight) hours. 90 tablet 0  . Multiple Vitamin (MULTIVITAMIN) capsule Take 1 capsule by mouth daily.      . naloxone (NARCAN) nasal spray 4 mg/0.1 mL Administer single spray of naloxone into one nostril. Additionally, spray can be given every 2-3 minutes as needed until emergency medical assistance arrives. Call 911 immediately after use. 1 each 2  . Oxycodone HCl 20 MG TABS Take 20 mg by mouth 3 (three) times daily as needed (pain).     . polyethylene glycol (MIRALAX / GLYCOLAX) 17 g packet Take 17 g by mouth daily as needed for mild constipation. 14 each 0  . potassium chloride SA (KLOR-CON) 20 MEQ tablet Take 20 mEq by mouth daily.    . prochlorperazine (COMPAZINE) 10 MG tablet Take 1 tablet (10 mg total) by mouth  every 6 (six) hours as needed for nausea or vomiting. 30 tablet 1  . senna-docusate (SENOKOT-S) 8.6-50 MG tablet Take 1 tablet by mouth 2 (two) times daily as needed for moderate constipation. 60 tablet 1  . sertraline (ZOLOFT) 100 MG tablet Take 1 tablet (100 mg total) by mouth daily. 30 tablet 5  . triamterene-hydrochlorothiazide (DYAZIDE) 37.5-25 MG capsule Take 1 each (1 capsule total) by mouth every morning. 30 capsule 3  . tucatinib (TUKYSA) 150 MG tablet Take 1 tablet (150 mg total) by mouth every morning. Take as directed by MD. 60 tablet 3   No current facility-administered medications for this visit.   Facility-Administered Medications Ordered in Other Visits  Medication Dose Route Frequency Provider Last Rate Last Admin  . sodium chloride 0.9 % injection 10 mL  10 mL Intravenous PRN Jeydan Barner, [name], MD   10 mL at 03/04/14 1421  . sodium chloride flush (NS) 0.9 % injection 10 mL  10 mL Intracatheter PRN Preet Mangano, [name], MD   10 mL at 07/30/19 1627    OBJECTIVE: Middle-aged African-American woman who appears stated age  Vitals:   12/18/19 0928  BP: (!) 146/95  Pulse: 88  Resp: 18  Temp: 98.2 F (36.8 C)  SpO2: 99%   Wt Readings from Last 3 Encounters:  12/18/19 203 lb 11.2 oz (92.4 kg)  11/19/19 190 lb 4.1 oz (86.3 kg)  10/18/19 190 lb 4.8 oz (86.3 kg)   Body mass index is 32.88 kg/m.    Sclerae unicteric, EOMs intact Wearing a mask No cervical or supraclavicular adenopathy Lungs no rales or rhonchi Heart regular rate and rhythm Abd soft, nontender, positive bowel sounds MSK no focal spinal tenderness, no upper extremity lymphedema Neuro: nonfocal, well oriented, appropriate affect Breasts: Status post bilateral mastectomy. Skin: The skin lesions on the back look improved as compared to the photos below.  Photo 07/04/2017     Photo 08/23/2018    LAB RESULTS:.  Lab Results  Component Value Date   WBC 11.6 (H) 12/18/2019   NEUTROABS 9.8 (H)  12/18/2019   HGB 11.4 (L) 12/18/2019   HCT 36.8 12/18/2019   MCV 88.7 12/18/2019   PLT 209 12/18/2019      Chemistry      Component  Value Date/Time   NA 139 11/21/2019 0430   NA 142 11/09/2017 1214   K 4.1 11/21/2019 0430   K 3.9 11/09/2017 1214   CL 100 11/21/2019 0430   CL 101 03/07/2013 1411   CO2 30 11/21/2019 0430   CO2 32 (H) 11/09/2017 1214   BUN 19 11/21/2019 0430   BUN 19.1 11/09/2017 1214   CREATININE 0.83 11/21/2019 0430   CREATININE 1.17 (H) 10/18/2019 1153   CREATININE 1.0 11/09/2017 1214      Component Value Date/Time   CALCIUM 8.9 11/21/2019 0430   CALCIUM 9.1 11/09/2017 1214   ALKPHOS 59 11/21/2019 0430   ALKPHOS 120 11/09/2017 1214   AST 31 11/21/2019 0430   AST 22 10/18/2019 1153   AST 30 11/09/2017 1214   ALT 26 11/21/2019 0430   ALT 16 10/18/2019 1153   ALT 19 11/09/2017 1214   BILITOT 0.3 11/21/2019 0430   BILITOT 0.5 10/18/2019 1153   BILITOT <0.22 11/09/2017 1214       STUDIES: DG Abd 1 View  Result Date: 11/19/2019 CLINICAL DATA:  Abdominal distention EXAM: ABDOMEN - 1 VIEW COMPARISON:  CT 03/01/2010 FINDINGS: Nonobstructive bowel gas pattern. Gas and large stool burden within the colon. No visible free air. IMPRESSION: Large stool burden throughout the colon. No evidence of bowel obstruction. Electronically Signed   By: Rolm Baptise [name]   On: 11/19/2019 22:13   CT Head Wo Contrast  Result Date: 11/19/2019 CLINICAL DATA:  64 year old female with head trauma. History of breast cancer with metastases to the brain. EXAM: CT HEAD WITHOUT CONTRAST CT CERVICAL SPINE WITHOUT CONTRAST TECHNIQUE: Multidetector CT imaging of the head and cervical spine was performed following the standard protocol without intravenous contrast. Multiplanar CT image reconstructions of the cervical spine were also generated. COMPARISON:  Brain MRI dated 09/04/2019 and CT dated 02/06/2018. FINDINGS: CT HEAD FINDINGS Brain: Is defined right frontal lobe mass measuring 18 x  14 mm. There is associated moderate surrounding vasogenic edema. There is edema also in the right temporal lobe likely secondary to underlying mass which is suboptimally seen on this noncontrast CT but in keeping with known history of antacids cyst. There is no mass effect or midline shift. The ventricles and sulci are otherwise appropriate size for patient's age. The gray-white matter discrimination is preserved. There is no acute intracranial hemorrhage. No extra-axial fluid collection. There is an empty sella turcica. Vascular: No hyperdense vessel or unexpected calcification. Skull: Normal. Negative for fracture or focal lesion. Sinuses/Orbits: Mild mucoperiosteal thickening of paranasal sinuses. No air-fluid level. The mastoid air cells are clear. Other: None CT CERVICAL SPINE FINDINGS Alignment: No acute subluxation. There is straightening of normal cervical lordosis which may be positional or due to muscle spasm. Skull base and vertebrae: No acute fracture. Soft tissues and spinal canal: No prevertebral fluid or swelling. No visible canal hematoma. Disc levels:  Mild degenerative changes. Upper chest: Negative. Other: Partially visualized Port-A-Cath. IMPRESSION: 1. No acute intracranial hemorrhage. 2. Right frontal and right parietal edema as seen on the prior MRI and in keeping with known metastatic disease. 3. No acute/traumatic cervical spine pathology. Electronically Signed   By: Anner Crete [name]   On: 11/19/2019 19:31   CT Cervical Spine Wo Contrast  Result Date: 11/19/2019 CLINICAL DATA:  64 year old female with head trauma. History of breast cancer with metastases to the brain. EXAM: CT HEAD WITHOUT CONTRAST CT CERVICAL SPINE WITHOUT CONTRAST TECHNIQUE: Multidetector CT imaging of the head and cervical spine was performed  following the standard protocol without intravenous contrast. Multiplanar CT image reconstructions of the cervical spine were also generated. COMPARISON:  Brain MRI dated  09/04/2019 and CT dated 02/06/2018. FINDINGS: CT HEAD FINDINGS Brain: Is defined right frontal lobe mass measuring 18 x 14 mm. There is associated moderate surrounding vasogenic edema. There is edema also in the right temporal lobe likely secondary to underlying mass which is suboptimally seen on this noncontrast CT but in keeping with known history of antacids cyst. There is no mass effect or midline shift. The ventricles and sulci are otherwise appropriate size for patient's age. The gray-white matter discrimination is preserved. There is no acute intracranial hemorrhage. No extra-axial fluid collection. There is an empty sella turcica. Vascular: No hyperdense vessel or unexpected calcification. Skull: Normal. Negative for fracture or focal lesion. Sinuses/Orbits: Mild mucoperiosteal thickening of paranasal sinuses. No air-fluid level. The mastoid air cells are clear. Other: None CT CERVICAL SPINE FINDINGS Alignment: No acute subluxation. There is straightening of normal cervical lordosis which may be positional or due to muscle spasm. Skull base and vertebrae: No acute fracture. Soft tissues and spinal canal: No prevertebral fluid or swelling. No visible canal hematoma. Disc levels:  Mild degenerative changes. Upper chest: Negative. Other: Partially visualized Port-A-Cath. IMPRESSION: 1. No acute intracranial hemorrhage. 2. Right frontal and right parietal edema as seen on the prior MRI and in keeping with known metastatic disease. 3. No acute/traumatic cervical spine pathology. Electronically Signed   By: Anner Crete [name]   On: 11/19/2019 19:31   DG Chest Port 1 View  Result Date: 11/19/2019 CLINICAL DATA:  64 year old female with history of cough and hypoxia. Found unresponsive by family. Possible aspiration. EXAM: PORTABLE CHEST 1 VIEW COMPARISON:  Chest x-ray 10/11/2019. FINDINGS: Left internal jugular single-lumen power porta cath with tip terminating in the right atrium. Mild linear scarring in the  left mid lung, unchanged. Lung volumes are low. No consolidative airspace disease. No pleural effusions. No pneumothorax. No pulmonary nodule or mass noted. Pulmonary vasculature and the cardiomediastinal silhouette are within normal limits. Aortic atherosclerosis. Surgical clips in the region of the right axilla, presumably from prior lymph node dissection. IMPRESSION: 1. No radiographic evidence of acute cardiopulmonary disease. 2. Aortic atherosclerosis. Electronically Signed   By: Vinnie Langton [name]   On: 11/19/2019 18:41   EEG adult  Result Date: 11/21/2019 Lora Havens, MD     11/21/2019 11:20 AM Patient Name: [name] MRN: [id number] Epilepsy Attending: Lora Havens Referring Physician/Provider: Dr. Wendee Beavers Date: 11/21/2019 Duration: 24.42 minutes Patient history: 64 year old female with history of breast cancer status postmastectomy, recent brain metastasis in right frontoparietal region, now COVID+ presented with altered mental status. Level of alertness: Awake AEDs during EEG study: None Technical aspects: This EEG study was done with scalp electrodes positioned according to the 10-20 International system of electrode placement. Electrical activity was acquired at a sampling rate of _0  and reviewed with a high frequency filter of _1  and a low frequency filter of _2 . EEG data were recorded continuously and digitally stored. Description: The posterior dominant rhythm consists of 9-10 Hz activity of moderate voltage (25-35 uV) seen predominantly in posterior head regions, symmetric and reactive to eye opening and eye closing.  EEG also showed intermittent generalized 3 to 7 Hz theta-delta slowing.  Hyperventilation and photic stimulation were not performed.        Abnormality -Continuous slow, generalized IMPRESSION: This study showed evidence of mild diffuse encephalopathy, nonspecific to etiology. No seizures or  epileptiform discharges were seen throughout the recording. Lora Havens     ASSESSMENT:  64 y.o. [place] woman with stage IV breast cancer manifested chiefly by loco-regional nodal disease (neck, chest) and skin involvement, without liver, bone, or brain metastases documented   (1) Status post right upper inner quadrant lumpectomy and sentinel lymph node sampling 03/04/2002 for 2 separate foci of invasive ductal carcinoma, mpT2 pN1 or stage IIB, both foci grade 3, both estrogen and progesterone receptor positive, both HercepTest 3+, Mib-1 56%  (2) Reexcision for margins 05/27/2002 showed no residual cancer in the breast.  (3) The patient refused adjuvant systemic therapy.  (4) Adjuvant radiation treatment completed 08/14/2002.  (5) recurrence in the right breast in 02/2004 showing a morphologically different tumor, again grade 3, again estrogen and progesterone receptor negative, with an MIB-1 of 14% and Herceptest 3+.  (5) Between 03/2004 and 07/2004 she received dose dense Doxorubicin/Cyclophosphamide x 4 given with trastuzumab, followed by weekly Docetaxel x 8, again given with trastuzumab.  (6) Right mastectomy 07/13/2004 showed scattered microscopic foci of residual disease over an area  greater than 5 cm. Margins were negative.  (7) Postoperative Docetaxel continued until 09/2004.  (8) Trastuzumab (Herceptin) given 08/2004 through 01/2012 with some brief interruptions.  RECURRENT/ STAGE IV DISEASE December 2006 (9) Isolated right cervical nodal recurrence 10/2005, treated with radiation to the right supraclavicular area (total 41.5 gray) completed 12/29/2005.  (10) Navelbine given together with Herceptin  11/2005 through 03/2006.  (9) Left mastectomy 02/13/2006 for ductal carcinoma in situ, grade 2, estrogen and progesterone receptor negative, with negative margins; 0 of 3 lymph nodes involved  (10) treated with Lapatinib and Capecitabine before 10/2009, for an unclear duration and with unclear results (cannot locate data on chart  review).  (11) Status post right supraclavicular lymph node biopsy 09/2010 again positive for an invasive ductal carcinoma, estrogen and progesterone receptor negative, HER-2 positive by CISH with a ratio 4.25.  (12) Navelbine given together with Herceptin between 05/2011 and 11/2011.  (13) Carboplatin/ Gemcitabine/ Herceptin given for 2 cycles, in 12/2011 and 01/2012.  (14) TDM-1 ([place]) started 02/2012. Last dose 10/02/2013 after which the patient discontinued treatment at her own discretion. Echo on December 2014 showed a well preserved ejection fraction.  (15) Deep vein thrombosis of the right upper extremity documented 04/20/2011.  She completed anticoagulant therapy with Coumadin on 03/25/2013.  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 Left chest Port-A-Cath placed on 04/08/2013; being flushed every 6 weeks  RIGHT UPPER EXTREMITY ANGIOSARCOMA VS BREAST CANCER: August 2016 (18) treated at [facility] [place] August 2016 with paclitaxel, trastuzumab and pertuzumab 1 cycle, afterwards referred to hospice  (19) under the care of hosp[facility] of [facility] August 2016 to 05/08/2016, when the patient opted for a second try at chemotherapy  (20) started low-dose Abraxane, trastuzumab and pertuzumab 06/07/2016, to be repeated every 4 weeks.   (a) pretreatment echocardiogram 06/06/2016 showed a 60-65% ejection fraction  (b) significant compliance problems compromise response to treatment  (c)  echocardiogram 02/20/17 LVEF 55-60%  (d) Abraxane discontinued 07/04/2017 because of possible neuropathy symptoms  (e) Abraxane resumed 09/27/2017  (f) echocardiogram 07/20/2017 showed an ejection fraction of 60-65%  (g) echo 01/22/2018 showed an ejection  fraction of 50-55%  (h) CT scan of the chest 05/03/2018  shows no disease involving the lungs liver or lymph nodes, with stable subcutaneous masses as previously noted  (I) Abraxane discontinued August 2019 because of concerns regarding neuropathy-- gemcitabine substituted  (j) echocardiogram 08/20/2018 shows an ejection fraction in the 55-60% range (done every 6 months)  (k) gemcitabine discontinued because of multiple symptoms, Abraxane restarted 09/11/2018, given once every 4 weeks  (l) echocardiogram 07/17/2019 shows a well-preserved ejection fraction at 55-60%  (m) restaging PET scan 08/20/2019 shows hypermetabolic adenopathy, subcutaneous malignancy as clinically appreciated, but no liver or lung parenchymal lesions    (21) Chronic pain secondary to known metastatic disease  (a) patient signed pain contract 10/19/2016  (b) as of 11/16/2016 receives her pain medications from Dr [name]  (c)  Dr [name] no longer able to prescribe as of February 2019  (d) all pain medications now through [facility] (as of April 2019)  (22) left breast and left axillary adenopathy noted clinically  (a) left axillary lymph node biopsy on 07/24/2019 benign  (23) BRAIN METASTASES: frontal and temporal lobe metastases noted on brain MRI 09/04/2019  (a) adjuvant radiation 09/11/2019-10/16/2019:    The brain was treated to 30 Gy in 10 fractions.   (b) tucatinib started December 2020, started at 150 mg daily because of methadone interaction  (24) COVID-19 infection 2019-11-19  PLAN: Mikala survived her COVID-19 infection.  Her counts have recovered.  She is now ready to resume her chronic Abraxane/trastuzumab/Pertuzumab.  This is repeated every 28 days.  She had her most recent echo in September.  She will have an echo before her next visit.  Generally we do these every 6 months in her case.  We discussed her brain mets.  Right now she is asymptomatic.  She understands she is at risk of these  recurring and that the chemotherapy and immunotherapy I am giving her do not work across the blood-brain barrier.  Accordingly I urged her to start her to cath in it.  She already has these on hand.  She will only take 1 tablet daily, which is I think as much as she is going to be able to tolerate.  I am also dropping her methadone dose to 5 mg 3 times a day instead of 10 mg 3 times a day.  I asked her to bring all her medicines in a bag next time so we can review those  She is already scheduled for brain MRI 01/07/2020  Total encounter time 35 minutes.*   Karra Pink, [name], MD  12/18/19 10:03 AM Medical Oncology and Hematology [facility] [address], [place] [address] Tel. [phone]    Fax. [phone]   I, Wilburn Mylar, am acting as scribe for Dr. [name]. Tawanda Schall.  I, Lurline Del MD, have reviewed the above documentation for accuracy and completeness, and I agree with the above.   *Total Encounter Time as defined by the Centers for Medicare and Medicaid Services includes, in addition to the face-to-face time of a patient visit (documented in the note above) non-face-to-face time: obtaining and reviewing outside history, ordering and reviewing medications, tests or procedures, care coordination (communications with other health care professionals or caregivers) and documentation in the medical record.

## 2019-12-18 ENCOUNTER — Other Ambulatory Visit: Payer: Self-pay

## 2019-12-18 ENCOUNTER — Inpatient Hospital Stay (HOSPITAL_BASED_OUTPATIENT_CLINIC_OR_DEPARTMENT_OTHER): Payer: Medicare Other | Admitting: Medical

## 2019-12-18 ENCOUNTER — Inpatient Hospital Stay: Payer: Medicare Other

## 2019-12-18 ENCOUNTER — Inpatient Hospital Stay: Payer: Medicare Other | Attending: Oncology

## 2019-12-18 ENCOUNTER — Inpatient Hospital Stay (HOSPITAL_BASED_OUTPATIENT_CLINIC_OR_DEPARTMENT_OTHER): Payer: Medicare Other | Admitting: Oncology

## 2019-12-18 VITALS — BP 146/95 | HR 88 | Temp 98.2°F | Resp 18 | Ht 66.0 in | Wt 203.7 lb

## 2019-12-18 DIAGNOSIS — C50211 Malignant neoplasm of upper-inner quadrant of right female breast: Secondary | ICD-10-CM

## 2019-12-18 DIAGNOSIS — E8989 Other postprocedural endocrine and metabolic complications and disorders: Secondary | ICD-10-CM

## 2019-12-18 DIAGNOSIS — C4911 Malignant neoplasm of connective and soft tissue of right upper limb, including shoulder: Secondary | ICD-10-CM

## 2019-12-18 DIAGNOSIS — F17210 Nicotine dependence, cigarettes, uncomplicated: Secondary | ICD-10-CM | POA: Diagnosis not present

## 2019-12-18 DIAGNOSIS — Z5111 Encounter for antineoplastic chemotherapy: Secondary | ICD-10-CM | POA: Insufficient documentation

## 2019-12-18 DIAGNOSIS — Z803 Family history of malignant neoplasm of breast: Secondary | ICD-10-CM | POA: Diagnosis not present

## 2019-12-18 DIAGNOSIS — Z79899 Other long term (current) drug therapy: Secondary | ICD-10-CM | POA: Insufficient documentation

## 2019-12-18 DIAGNOSIS — Z86718 Personal history of other venous thrombosis and embolism: Secondary | ICD-10-CM | POA: Diagnosis not present

## 2019-12-18 DIAGNOSIS — I10 Essential (primary) hypertension: Secondary | ICD-10-CM | POA: Diagnosis not present

## 2019-12-18 DIAGNOSIS — I82403 Acute embolism and thrombosis of unspecified deep veins of lower extremity, bilateral: Secondary | ICD-10-CM

## 2019-12-18 DIAGNOSIS — G620 Drug-induced polyneuropathy: Secondary | ICD-10-CM

## 2019-12-18 DIAGNOSIS — I429 Cardiomyopathy, unspecified: Secondary | ICD-10-CM

## 2019-12-18 DIAGNOSIS — Z170 Estrogen receptor positive status [ER+]: Secondary | ICD-10-CM | POA: Diagnosis not present

## 2019-12-18 DIAGNOSIS — I890 Lymphedema, not elsewhere classified: Secondary | ICD-10-CM | POA: Insufficient documentation

## 2019-12-18 DIAGNOSIS — Z7952 Long term (current) use of systemic steroids: Secondary | ICD-10-CM | POA: Diagnosis not present

## 2019-12-18 DIAGNOSIS — Z853 Personal history of malignant neoplasm of breast: Secondary | ICD-10-CM | POA: Diagnosis not present

## 2019-12-18 DIAGNOSIS — Z923 Personal history of irradiation: Secondary | ICD-10-CM | POA: Diagnosis not present

## 2019-12-18 DIAGNOSIS — Z5112 Encounter for antineoplastic immunotherapy: Secondary | ICD-10-CM | POA: Diagnosis present

## 2019-12-18 DIAGNOSIS — C7931 Secondary malignant neoplasm of brain: Secondary | ICD-10-CM

## 2019-12-18 LAB — CBC WITH DIFFERENTIAL (CANCER CENTER ONLY)
Abs Immature Granulocytes: 0.49 10*3/uL — ABNORMAL HIGH (ref 0.00–0.07)
Basophils Absolute: 0 10*3/uL (ref 0.0–0.1)
Basophils Relative: 0 %
Eosinophils Absolute: 0 10*3/uL (ref 0.0–0.5)
Eosinophils Relative: 0 %
HCT: 36.8 % (ref 36.0–46.0)
Hemoglobin: 11.4 g/dL — ABNORMAL LOW (ref 12.0–15.0)
Immature Granulocytes: 4 %
Lymphocytes Relative: 7 %
Lymphs Abs: 0.8 10*3/uL (ref 0.7–4.0)
MCH: 27.5 pg (ref 26.0–34.0)
MCHC: 31 g/dL (ref 30.0–36.0)
MCV: 88.7 fL (ref 80.0–100.0)
Monocytes Absolute: 0.5 10*3/uL (ref 0.1–1.0)
Monocytes Relative: 5 %
Neutro Abs: 9.8 10*3/uL — ABNORMAL HIGH (ref 1.7–7.7)
Neutrophils Relative %: 84 %
Platelet Count: 209 10*3/uL (ref 150–400)
RBC: 4.15 MIL/uL (ref 3.87–5.11)
RDW: 18.8 % — ABNORMAL HIGH (ref 11.5–15.5)
WBC Count: 11.6 10*3/uL — ABNORMAL HIGH (ref 4.0–10.5)
nRBC: 0 % (ref 0.0–0.2)

## 2019-12-18 LAB — CMP (CANCER CENTER ONLY)
ALT: 30 U/L (ref 0–44)
AST: 28 U/L (ref 15–41)
Albumin: 3.3 g/dL — ABNORMAL LOW (ref 3.5–5.0)
Alkaline Phosphatase: 75 U/L (ref 38–126)
Anion gap: 14 (ref 5–15)
BUN: 15 mg/dL (ref 8–23)
CO2: 30 mmol/L (ref 22–32)
Calcium: 9.5 mg/dL (ref 8.9–10.3)
Chloride: 98 mmol/L (ref 98–111)
Creatinine: 0.86 mg/dL (ref 0.44–1.00)
GFR, Est AFR Am: >60 mL/min (ref >60)
GFR, Estimated: >60 mL/min (ref >60)
Glucose, Bld: 134 mg/dL — ABNORMAL HIGH (ref 70–99)
Potassium: 4.4 mmol/L (ref 3.5–5.1)
Sodium: 142 mmol/L (ref 135–145)
Total Bilirubin: 0.3 mg/dL (ref 0.3–1.2)
Total Protein: 7 g/dL (ref 6.5–8.1)

## 2019-12-18 MED ORDER — PROCHLORPERAZINE MALEATE 10 MG PO TABS
10.0000 mg | ORAL_TABLET | Freq: Once | ORAL | Status: AC
  Administered 2019-12-18: 10 mg via ORAL

## 2019-12-18 MED ORDER — DIPHENHYDRAMINE HCL 25 MG PO CAPS
25.0000 mg | ORAL_CAPSULE | Freq: Once | ORAL | Status: AC
  Administered 2019-12-18: 25 mg via ORAL

## 2019-12-18 MED ORDER — HEPARIN SOD (PORK) LOCK FLUSH 100 UNIT/ML IV SOLN
500.0000 [IU] | Freq: Once | INTRAVENOUS | Status: AC | PRN
  Administered 2019-12-18: 500 [IU]
  Filled 2019-12-18: qty 5

## 2019-12-18 MED ORDER — ACETAMINOPHEN 325 MG PO TABS
650.0000 mg | ORAL_TABLET | Freq: Once | ORAL | Status: AC
  Administered 2019-12-18: 650 mg via ORAL

## 2019-12-18 MED ORDER — METHADONE HCL 5 MG PO TABS: 5.0000 mg | ORAL_TABLET | Freq: Three times a day (TID) | ORAL | 0 refills | Status: DC

## 2019-12-18 MED ORDER — SODIUM CHLORIDE 0.9% FLUSH
10.0000 mL | INTRAVENOUS | Status: DC | PRN
  Administered 2019-12-18: 10 mL
  Filled 2019-12-18: qty 10

## 2019-12-18 MED ORDER — DIPHENHYDRAMINE HCL 25 MG PO CAPS
ORAL_CAPSULE | ORAL | Status: AC
  Filled 2019-12-18: qty 1

## 2019-12-18 MED ORDER — ACETAMINOPHEN 325 MG PO TABS
ORAL_TABLET | ORAL | Status: AC
  Filled 2019-12-18: qty 2

## 2019-12-18 MED ORDER — SODIUM CHLORIDE 0.9 % IV SOLN
420.0000 mg | Freq: Once | INTRAVENOUS | Status: AC
  Administered 2019-12-18: 420 mg via INTRAVENOUS
  Filled 2019-12-18: qty 14

## 2019-12-18 MED ORDER — PROCHLORPERAZINE MALEATE 10 MG PO TABS
ORAL_TABLET | ORAL | Status: AC
  Filled 2019-12-18: qty 1

## 2019-12-18 MED ORDER — TRASTUZUMAB-ANNS CHEMO 150 MG IV SOLR
6.0000 mg/kg | Freq: Once | INTRAVENOUS | Status: AC
  Administered 2019-12-18: 525 mg via INTRAVENOUS
  Filled 2019-12-18: qty 25

## 2019-12-18 MED ORDER — PACLITAXEL PROTEIN-BOUND CHEMO INJECTION 100 MG
100.0000 mg/m2 | Freq: Once | INTRAVENOUS | Status: AC
  Administered 2019-12-18: 200 mg via INTRAVENOUS
  Filled 2019-12-18: qty 40

## 2019-12-18 MED ORDER — SODIUM CHLORIDE 0.9 % IV SOLN
Freq: Once | INTRAVENOUS | Status: AC
  Filled 2019-12-18: qty 250

## 2019-12-18 NOTE — Patient Instructions (Signed)
[facility] Discharge Instructions for Patients Receiving Chemotherapy  Today you received the following chemotherapy agents:  Trastuzumab, Pertuzumab, Abraxane  To help prevent nausea and vomiting after your treatment, we encourage you to take your nausea medication as prescribed.   If you develop nausea and vomiting that is not controlled by your nausea medication, call the clinic.   BELOW ARE SYMPTOMS THAT SHOULD BE REPORTED IMMEDIATELY:  *FEVER GREATER THAN 100.5 F  *CHILLS WITH OR WITHOUT FEVER  NAUSEA AND VOMITING THAT IS NOT CONTROLLED WITH YOUR NAUSEA MEDICATION  *UNUSUAL SHORTNESS OF BREATH  *UNUSUAL BRUISING OR BLEEDING  TENDERNESS IN MOUTH AND THROAT WITH OR WITHOUT PRESENCE OF ULCERS  *URINARY PROBLEMS  *BOWEL PROBLEMS  UNUSUAL RASH Items with * indicate a potential emergency and should be followed up as soon as possible.  Feel free to call the clinic should you have any questions or concerns. The clinic phone number is [phone].  Please show the [facility] at check-in to the Emergency Department and triage nurse.

## 2019-12-19 ENCOUNTER — Telehealth: Payer: Self-pay | Admitting: Oncology

## 2019-12-19 ENCOUNTER — Telehealth: Payer: Self-pay | Admitting: *Deleted

## 2019-12-19 DIAGNOSIS — G620 Drug-induced polyneuropathy: Secondary | ICD-10-CM | POA: Insufficient documentation

## 2019-12-19 NOTE — Telephone Encounter (Signed)
This RN received faxed communication from [facility] [place] that pt called at 6:43 pm yesterday stating " Caller thinks her money is gone from her wallet while at infusion room ".  This RN attempted to return call this AM and obtained pt's VM which stated " is full and cannot leave message ".  This RN will alert office and nurse director of above.

## 2019-12-19 NOTE — Telephone Encounter (Signed)
I talk with patient regarding schedule  

## 2019-12-19 NOTE — Progress Notes (Signed)
The patient was seen in the infusion room today after she requested to have a prescription for OxyContin.  She reports that she had been given a prescription for methadone by Dr. Jana Hakim and did not like this medication.  She was told that a message to be sent to Dr. Jana Hakim regarding her request.  She opts not to have that done.  No prescription was given to her today.  Sandi Mealy, MHS, PA-C Physician Assistant

## 2019-12-23 NOTE — Telephone Encounter (Signed)
Refill request

## 2019-12-25 ENCOUNTER — Other Ambulatory Visit: Payer: Self-pay

## 2019-12-25 ENCOUNTER — Ambulatory Visit (HOSPITAL_COMMUNITY): Payer: Medicare Other | Attending: Cardiology

## 2019-12-25 ENCOUNTER — Telehealth: Payer: Self-pay | Admitting: *Deleted

## 2019-12-25 ENCOUNTER — Other Ambulatory Visit: Payer: Medicare Other | Admitting: Hospice

## 2019-12-25 DIAGNOSIS — Z8616 Personal history of COVID-19: Secondary | ICD-10-CM | POA: Diagnosis not present

## 2019-12-25 DIAGNOSIS — Z01810 Encounter for preprocedural cardiovascular examination: Secondary | ICD-10-CM | POA: Insufficient documentation

## 2019-12-25 DIAGNOSIS — C4911 Malignant neoplasm of connective and soft tissue of right upper limb, including shoulder: Secondary | ICD-10-CM | POA: Diagnosis not present

## 2019-12-25 DIAGNOSIS — C50211 Malignant neoplasm of upper-inner quadrant of right female breast: Secondary | ICD-10-CM

## 2019-12-25 DIAGNOSIS — E8989 Other postprocedural endocrine and metabolic complications and disorders: Secondary | ICD-10-CM | POA: Insufficient documentation

## 2019-12-25 DIAGNOSIS — I083 Combined rheumatic disorders of mitral, aortic and tricuspid valves: Secondary | ICD-10-CM | POA: Diagnosis not present

## 2019-12-25 DIAGNOSIS — Z170 Estrogen receptor positive status [ER+]: Secondary | ICD-10-CM | POA: Insufficient documentation

## 2019-12-25 DIAGNOSIS — I119 Hypertensive heart disease without heart failure: Secondary | ICD-10-CM | POA: Insufficient documentation

## 2019-12-25 DIAGNOSIS — F17200 Nicotine dependence, unspecified, uncomplicated: Secondary | ICD-10-CM | POA: Diagnosis not present

## 2019-12-25 DIAGNOSIS — I890 Lymphedema, not elsewhere classified: Secondary | ICD-10-CM

## 2019-12-25 DIAGNOSIS — Z515 Encounter for palliative care: Secondary | ICD-10-CM

## 2019-12-25 DIAGNOSIS — C7931 Secondary malignant neoplasm of brain: Secondary | ICD-10-CM | POA: Diagnosis not present

## 2019-12-25 NOTE — Progress Notes (Signed)
[facility] Consult Note Telephone: [phone]  Fax: [phone]  PATIENT NAME: [name] DOB: June 22, 1956 MRN: [id number]  PRIMARY CARE PROVIDER:   Patient, No Pcp Per  REFERRING PROVIDER:  [name] RESPONSIBLE PARTY:   Self Contacts: Dyann Kief and [employer] (daughters   RECOMMENDATIONS/PLAN:   Advance Care Planning/Goals of Care:  Visit consisted of building trust and discussions on Palliative Medicine as specialized medical care for people living with serious illness, aimed at facilitating better quality of life through symptoms relief, assisting with advance care plan and establishing goals of care. Patient affirmed she is a full code. She said she is living for her grandson and would want all measures to keep her alive at this time; she is open to reconsider her code status in the future. She shared her long battle with cancer; she is no longer driving because of CA mets to her brain. Therapeutic listening and emotional support provided. Goals of care include to maximize quality of life and symptom management. 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 Naloxone at home. She goes to a pain clinic. She expressed satisfaction with the management of her pain. She denied pain/discomfort during visit. She reports chemo is ongoing and she has the next month next month. Fatigue due to chemo improves a few days after the chemo treatment, no nausea/vomiting. She has daily nursing aid service.  Patient said she was prescribed Tukysa for her CA but she has not taken it. Bottle was unopened. Education provided that Laury [name] will help stop CA proliferation; encouraged taking it daily as ordered by her oncologist and to call if she had any adverse side effects or concerns. She was agreeable and said she would start to take it. She is scheduled for  brain MRI on 2020-01-07 to follow-up on her brain metastases. Follow up: Palliative care will continue to follow patient for goals of care clarification and symptom management. I spent 76 minutes providing this consultation; time includes chart review and documentation. More than 50% of the time in this consultation was spent on coordinating communication  HISTORY OF PRESENT ILLNESS:  [name] is a 64 y.o. year old female with multiple medical problems includingStage IV estrogen receptor negative Breast Cancer, with brain mets, angiosarcoma, Palliative Care was asked to help address goals of care.    CODE STATUS: Full  PPS: 60% HOSPICE ELIGIBILITY/DIAGNOSIS: TBD  PAST MEDICAL HISTORY:  Past Medical History:  Diagnosis Date  . Breast cancer ([facility])   . DVT (deep venous thrombosis) ([facility]) 10/07/2011  . Fever 02/06/2018  . Hypertension   . Radiation 11/29/2005-12/29/2005   right supraclavicular area 4147 cGy  . Radiation 06/26/02-08/14/02   right breast 5040 cGy, tumor bed boosted to 1260 cGy  . Rheumatic fever     SOCIAL HX:  Social History   Tobacco Use  . Smoking status: Light Tobacco Smoker  . Smokeless tobacco: Never Used  Substance Use Topics  . Alcohol use: Yes    Comment: Rarely    ALLERGIES:  Allergies  Allergen Reactions  . Tramadol Nausea Only  . Hydrocodone-Acetaminophen Itching and Rash  . Pork-Derived Products Other (See Comments)    Pt says she just doesn't eat pork; has no reaction to pork products     PERTINENT MEDICATIONS:  Outpatient Encounter Medications as of 12/25/2019  Medication Sig  . ALPRAZolam (XANAX) 1 MG tablet Take 1 mg by mouth at bedtime as  needed for sleep.   . Ascorbic Acid (VITAMIN C PO) Take 1 tablet by mouth daily.  . cholecalciferol (VITAMIN D) 1000 units tablet Take 1 tablet (1,000 Units total) by mouth daily. (Patient not taking: Reported on 11/19/2019)  . dexamethasone (DECADRON) 4 MG tablet Take 2 mg by mouth 2 (two) times daily.   Marland Kitchen  gabapentin (NEURONTIN) 600 MG tablet Take 600 mg by mouth 3 (three) times daily.   . hydrocortisone (CORTEF) 20 MG tablet as directed.  . magic mouthwash SOLN Take 10 mLs by mouth 4 (four) times daily -  before meals and at bedtime. Swish and spit.  . methadone (DOLOPHINE) 5 MG tablet Take 1 tablet (5 mg total) by mouth every 8 (eight) hours.  . Multiple Vitamin (MULTIVITAMIN) capsule Take 1 capsule by mouth daily.    . naloxone (NARCAN) nasal [name] 4 mg/0.1 mL Administer single [name] of naloxone into one nostril. Additionally, [name] can be given every 2-3 minutes as needed until emergency medical assistance arrives. Call 911 immediately after use.  Marland Kitchen Oxycodone HCl 20 MG TABS Take 20 mg by mouth 3 (three) times daily as needed (pain).   . polyethylene glycol (MIRALAX / GLYCOLAX) 17 g packet Take 17 g by mouth daily as needed for mild constipation.  . potassium chloride SA (KLOR-CON) 20 MEQ tablet Take 20 mEq by mouth daily.  . prochlorperazine (COMPAZINE) 10 MG tablet Take 1 tablet (10 mg total) by mouth every 6 (six) hours as needed for nausea or vomiting.  . senna-docusate (SENOKOT-S) 8.6-50 MG tablet Take 1 tablet by mouth 2 (two) times daily as needed for moderate constipation.  . sertraline (ZOLOFT) 100 MG tablet TAKE 1 TABLET BY MOUTH EVERY DAY  . triamterene-hydrochlorothiazide (DYAZIDE) 37.5-25 MG capsule Take 1 each (1 capsule total) by mouth every morning.  . tucatinib (TUKYSA) 150 MG tablet Take 1 tablet (150 mg total) by mouth every morning. Take as directed by MD.   Facility-Administered Encounter Medications as of 12/25/2019  Medication  . sodium chloride 0.9 % injection 10 mL  . sodium chloride flush (NS) 0.9 % injection 10 mL    PHYSICAL EXAM/ROS:   General: Co-operative, in no acute distress Cardiovascular: regular rate and rhythm Pulmonar: no adventitious sounds ausclted, breaths heavily at baseline Abdomen: soft, nontender, + bowel sounds GU: no suprapubic  tenderness Extremities: no edema, no joint deformities Skin: no rashes on exposed skin Neurological: Weakness but otherwise nonfocal  [name] Spray, NP

## 2019-12-26 NOTE — Progress Notes (Signed)
  Radiation Oncology         [phone] ________________________________  Name: [name] MRN: [id number]  Date: 10/16/2019  DOB: [birth year]  End of Treatment Note  Diagnosis:   brain metastasis   Indication for treatment::  palliative       Radiation treatment dates:   09/11/19 - 10/16/19  Site/dose:   The patient was treated with a course of whole brain radiation therapy to a dose of 30 Gy in 10 fractions.  This was accomplished using a 2 field technique with additional reduced fields as necessary to improve dose homogeneity.  Narrative: The patient tolerated radiation treatment relatively well.   No unexpected difficulties in terms of acute toxicity.  The skin held up to treatment fairly well.  Plan: The patient has completed radiation treatment. The patient will return to radiation oncology clinic for routine followup in one month. I advised the patient to call or return sooner if they have any questions or concerns related to their recovery or treatment. ________________________________  Jodelle Gross, [name], Ph.D.

## 2019-12-27 ENCOUNTER — Other Ambulatory Visit: Payer: Self-pay | Admitting: Oncology

## 2019-12-27 NOTE — Progress Notes (Signed)
[name] called today to let us know she wants to reschedule the MRI because she is not ready.  She also says she has not been taking the tucatinib but after she met with the palliative nurse yesterday she will start today. 

## 2020-01-03 IMAGING — PT NM PET TUM IMG RESTAG (PS) SKULL BASE T - THIGH
1 of 8 series · 1 of 25 positions shown · non-contrast
Comparison: 02/25/2014

CLINICAL DATA: Subsequent treatment strategy for breast cancer and
angiosarcoma of the right upper extremity.

EXAM:
NUCLEAR MEDICINE PET SKULL BASE TO THIGH
TECHNIQUE: 9.4 mCi F-18 FDG was injected intravenously. Full-ring PET imaging
was performed from the skull base to thigh after the radiotracer. CT
data was obtained and used for attenuation correction and anatomic
localization.
Fasting blood glucose: 100 mg/dl

[Series 4: ct sk_thigh 5.0 b31f · axial · 5.0mm · 0.98mm/px · 1 of 202 slices shown]
[im 202/202  brain]
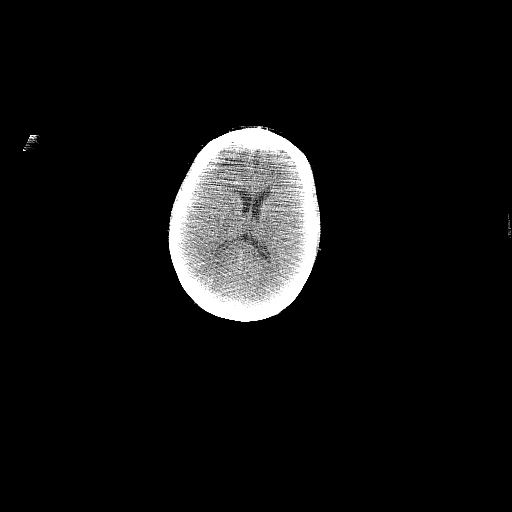

[1 of 25 positions shown; findings below may reference images not displayed]

FINDINGS: Mediastinal blood pool activity: SUV max

Liver activity: SUV max NA

NECK: Anteriorly in the right middle cranial fossa, a hypermetabolic
lesion measuring about 5.0 by 2.4 by 3.4 cm is present, maximum SUV
21.5. Possible photopenic lesion in the right frontal lobe region.
Neither of these are really well appreciated on the CT data.

There is bilateral level IIa, and left level IIb hypermetabolic
adenopathy in the neck.

An indistinctly marginated left level IIa lymph node probably
measuring about 0.8 cm in short axis on image 34/4 has a maximum SUV
9.3. A left level IIb lymph node measuring 0.9 cm in short axis on
image 34/4 has a maximum SUV of 9.0. A small right focus of
accentuated metabolic activity in the vicinity of the right level 2
chain has a maximum SUV of 5.9.

There is also some mildly asymmetric accentuated activity along the
right palatine tonsil, maximum SUV 5.6 compared to the left 3.7,
significance uncertain. Mildly asymmetric glottic activity, with the
right cord demonstrating mildly accentuated activity compared to the
left.

Incidental CT findings: none

CHEST: There is a patch of subcutaneous infiltration along the right
upper back with foci accentuated metabolic activity internally. Most
prominent of these is somewhat centrally within this region,
measuring about 1.5 cm in diameter with maximum SUV 12.2. Further
caudad there is similar subcutaneous infiltration as shown on image
78/4 where a 5.7 by 1.9 cm lesion in the subcutaneous tissues has a
maximum SUV of 19.6.

A left axillary node measuring 1.4 cm in short axis on image 56/4
has a maximum SUV of 2.0.

Incidental CT findings: Radiation therapy related findings
anteriorly in the right chest. Subcutaneous stranding in the right
upper arm for example on image [DATE]. Bilateral mastectomies,
atrophic pectoralis musculature on the right.

ABDOMEN/PELVIS: Hypermetabolic retroperitoneal, bilateral common
iliac, left external iliac, and bilateral inguinal adenopathy. A
left periaortic lymph node measuring 1.3 cm in short axis on image
129/4 has a maximum SUV of 14.9. Index left external iliac node
cm in short axis on image 160/4, maximum SUV 11.7. Index left
inguinal lymph node 1.6 cm in short axis on image 172/4, maximum SUV
11.9.

Indistinct celiac node with maximum SUV of 4.8.

Incidental CT findings: Prominence of the common bile duct at 1.1 cm
in diameter, cause uncertain. Aortoiliac atherosclerotic vascular
disease. Pelvic floor laxity with cystocele extending 1.5 cm below
the pubococcygeal line.

SKELETON: No significant abnormal hypermetabolic activity in this
region.

Incidental CT findings: none
IMPRESSION: 1. Hypermetabolic bilateral internal jugular, retroperitoneal, and
pelvic adenopathy compatible with active malignancy.
2. 5.0 by 2.4 by 3.4 cm hypermetabolic intracranial lesion
anteriorly in the right middle cranial fossa. Little in the way of
correlative findings on the CT. This could be metastatic. Brain MRI
with and without contrast is recommended, if not feasible than
consider CT of the head with and without contrast.
3. They are patches of subcutaneous infiltration along the right
back with hypermetabolic activity, concerning for subcutaneous
malignancy.
4. Mildly asymmetric palatine tonsillar activity and glottic
activity, probably incidental.
5. Other imaging findings of potential clinical significance:
Dilated common bile duct, cause uncertain. Aortic Atherosclerosis
([id number]). Pelvic floor laxity with cystocele.

## 2020-01-06 ENCOUNTER — Telehealth: Payer: Self-pay

## 2020-01-06 NOTE — Telephone Encounter (Signed)
RN placed call to patient to follow up with on call message.  No answer, left message for patient to call back if still needed.

## 2020-01-07 ENCOUNTER — Other Ambulatory Visit: Payer: Self-pay | Admitting: *Deleted

## 2020-01-07 ENCOUNTER — Telehealth: Payer: Self-pay | Admitting: *Deleted

## 2020-01-07 ENCOUNTER — Ambulatory Visit (HOSPITAL_COMMUNITY): Admission: RE | Admit: 2020-01-07 | Payer: Medicare Other | Source: Ambulatory Visit

## 2020-01-07 MED ORDER — OMEPRAZOLE 40 MG PO CPDR: 40.0000 mg | DELAYED_RELEASE_CAPSULE | Freq: Every day | ORAL | 1 refills | Status: DC

## 2020-01-07 NOTE — Telephone Encounter (Signed)
This RN spoke with pt per call - [name] states she is having " a sore stomach and it's tender and like acid reflux "  She states she vomits occasionally - which she feels is more related to the acid reflux and not nausea.  She is using " what the medicine is [name] gave me for nausea ( compazine is on med list ) " with benefit for vomiting but not discomfort.  She is not taking any OTC meds like tums or pepcid.  Of note she stated " I am taking 2 of the chemo pills a day "  Per above- review with MD and chart- this RN called pt back and got VM on both her cell and her home number.  This RN left detailed message on each VM- informing pt medication was called to pharmacy for reflux as well she is to stop the [place] at this time until she is seen this week due to need to review appropriate dosing.

## 2020-01-08 NOTE — Telephone Encounter (Signed)
No entry 

## 2020-01-12 ENCOUNTER — Emergency Department (HOSPITAL_COMMUNITY)
Admission: EM | Admit: 2020-01-12 | Discharge: 2020-01-12 | Disposition: A | Discharge status: Home / Self Care | Payer: Medicare Other | Attending: Emergency Medicine | Admitting: Emergency Medicine

## 2020-01-12 ENCOUNTER — Encounter (HOSPITAL_COMMUNITY): Payer: Self-pay

## 2020-01-12 ENCOUNTER — Emergency Department (HOSPITAL_COMMUNITY): Payer: Medicare Other

## 2020-01-12 ENCOUNTER — Other Ambulatory Visit: Payer: Self-pay

## 2020-01-12 DIAGNOSIS — F17210 Nicotine dependence, cigarettes, uncomplicated: Secondary | ICD-10-CM | POA: Diagnosis not present

## 2020-01-12 DIAGNOSIS — Z8616 Personal history of COVID-19: Secondary | ICD-10-CM | POA: Insufficient documentation

## 2020-01-12 DIAGNOSIS — C7931 Secondary malignant neoplasm of brain: Secondary | ICD-10-CM | POA: Insufficient documentation

## 2020-01-12 DIAGNOSIS — C50919 Malignant neoplasm of unspecified site of unspecified female breast: Secondary | ICD-10-CM | POA: Insufficient documentation

## 2020-01-12 DIAGNOSIS — R519 Headache, unspecified: Secondary | ICD-10-CM | POA: Diagnosis not present

## 2020-01-12 DIAGNOSIS — Z79899 Other long term (current) drug therapy: Secondary | ICD-10-CM | POA: Insufficient documentation

## 2020-01-12 DIAGNOSIS — I10 Essential (primary) hypertension: Secondary | ICD-10-CM | POA: Diagnosis not present

## 2020-01-12 DIAGNOSIS — R42 Dizziness and giddiness: Secondary | ICD-10-CM | POA: Diagnosis present

## 2020-01-12 LAB — CBC WITH DIFFERENTIAL/PLATELET
Abs Immature Granulocytes: 1.23 10*3/uL — ABNORMAL HIGH (ref 0.00–0.07)
Basophils Absolute: 0.1 10*3/uL (ref 0.0–0.1)
Basophils Relative: 0 %
Eosinophils Absolute: 0 10*3/uL (ref 0.0–0.5)
Eosinophils Relative: 0 %
HCT: 35.2 % — ABNORMAL LOW (ref 36.0–46.0)
Hemoglobin: 10.7 g/dL — ABNORMAL LOW (ref 12.0–15.0)
Immature Granulocytes: 6 %
Lymphocytes Relative: 5 %
Lymphs Abs: 1.1 10*3/uL (ref 0.7–4.0)
MCH: 28.3 pg (ref 26.0–34.0)
MCHC: 30.4 g/dL (ref 30.0–36.0)
MCV: 93.1 fL (ref 80.0–100.0)
Monocytes Absolute: 1 10*3/uL (ref 0.1–1.0)
Monocytes Relative: 5 %
Neutro Abs: 17.6 10*3/uL — ABNORMAL HIGH (ref 1.7–7.7)
Neutrophils Relative %: 84 %
Platelets: 172 10*3/uL (ref 150–400)
RBC: 3.78 MIL/uL — ABNORMAL LOW (ref 3.87–5.11)
RDW: 18 % — ABNORMAL HIGH (ref 11.5–15.5)
WBC: 21 10*3/uL — ABNORMAL HIGH (ref 4.0–10.5)
nRBC: 0.1 % (ref 0.0–0.2)

## 2020-01-12 LAB — BASIC METABOLIC PANEL
Anion gap: 10 (ref 5–15)
BUN: 19 mg/dL (ref 8–23)
CO2: 30 mmol/L (ref 22–32)
Calcium: 9.7 mg/dL (ref 8.9–10.3)
Chloride: 100 mmol/L (ref 98–111)
Creatinine, Ser: 0.87 mg/dL (ref 0.44–1.00)
GFR calc Af Amer: >60 mL/min (ref >60)
GFR calc non Af Amer: >60 mL/min (ref >60)
Glucose, Bld: 154 mg/dL — ABNORMAL HIGH (ref 70–99)
Potassium: 4 mmol/L (ref 3.5–5.1)
Sodium: 140 mmol/L (ref 135–145)

## 2020-01-12 MED ORDER — HYDROMORPHONE HCL 1 MG/ML IJ SOLN
1.0000 mg | Freq: Once | INTRAMUSCULAR | Status: AC
  Administered 2020-01-12: 1 mg via INTRAVENOUS
  Filled 2020-01-12: qty 1

## 2020-01-12 MED ORDER — OXYCODONE HCL 20 MG PO TABS: 20.0000 mg | ORAL_TABLET | Freq: Three times a day (TID) | ORAL | 0 refills | Status: DC | PRN

## 2020-01-12 MED ORDER — HEPARIN SOD (PORK) LOCK FLUSH 100 UNIT/ML IV SOLN
500.0000 [IU] | Freq: Once | INTRAVENOUS | Status: AC
  Administered 2020-01-12: 500 [IU]
  Filled 2020-01-12: qty 5

## 2020-01-12 NOTE — ED Triage Notes (Signed)
Pt arrived POV wheelchair assist in ED CC Dizziness. Pt denies LOC or any falls today but endorsees previous falls at home. PT reports receiving active chemo treatments " for breast cancer that spread to my brain". Pt reports " my belly has been this big since last time I was at the doctor".     Hx right breast cancer, "acid reflux"  VSS Afebrile

## 2020-01-12 NOTE — ED Notes (Signed)
Pt refused discharge vitals. PT stated husband has been waiting too long. Pt A/O x4 and ambulatory at baseline at discharge. PT educated to follow up with oncology provider. PT port deaccessed per policy.

## 2020-01-12 NOTE — ED Provider Notes (Signed)
[facility] DEPT Provider Note   CSN: [id number] Arrival date & time: 01/12/20  1826     History Chief Complaint  Patient presents with  . Dizzyness    [name] is a 64 y.o. female.  She is complaining of dizziness and head pain.  She has a history of breast cancer with mets to brain.  She has had radiation.  She says her doctor sent her here to get some pain medicine.  She is also complaining of worsening acid reflux.  Her med list shows her to be on Decadron and methadone.  She is a rather poor historian.  The history is provided by the patient. The history is limited by the condition of the patient.  Dizziness Severity:  Moderate Onset quality:  Unable to specify Timing:  Intermittent Progression:  Worsening Chronicity:  New Relieved by:  Nothing Worsened by:  Nothing Ineffective treatments:  Medication Associated symptoms: headaches   Associated symptoms: no chest pain, no shortness of breath, no vision changes and no vomiting   Risk factors: multiple medications        Past Medical History:  Diagnosis Date  . Breast cancer ([place])   . DVT (deep venous thrombosis) ([place]) 10/07/2011  . Fever 02/06/2018  . Hypertension   . Radiation 11/29/2005-12/29/2005   right supraclavicular area 4147 cGy  . Radiation 06/26/02-08/14/02   right breast 5040 cGy, tumor bed boosted to 1260 cGy  . Rheumatic fever     Patient Active Problem List   Diagnosis Date Noted  . Drug-induced polyneuropathy ([place]) 12/19/2019  . Acute respiratory failure with hypoxia ([place]) 11/19/2019  . COVID-19 virus infection 11/19/2019  . Overdose opiate, accidental or unintentional, initial encounter ([place]) 11/19/2019  . Acute encephalopathy 11/19/2019  . Metastasis to brain ([place]) 09/13/2019  . Acute lower UTI 02/06/2018  . Altered mental status 02/06/2018  . Bacteremia 02/06/2018  . Polypharmacy 02/06/2018  . Goals of care, counseling/discussion 09/05/2017  . Pneumonia  01/25/2017  . CAP (community acquired pneumonia) 01/23/2017  . Port catheter in place 05/30/2016  . 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 Chronic pain 04/13/2015  . Left arm pain 08/18/2014  . Malignant neoplasm of upper-inner quadrant of right breast in female, estrogen receptor positive ([place]) 10/03/2013  . Post-lymphadenectomy lymphedema of arm 08/15/2013  . Port or reservoir infection 01/29/2013  . DVT (deep venous thrombosis) ([place]) 10/07/2011  . Chest pain 09/10/2009  . Secondary cardiomyopathy ([place]) 09/02/2009  . Essential hypertension 02/26/2009  . GERD 02/26/2009    Past Surgical History:  Procedure Laterality Date  . BREAST SURGERY    . MASTECTOMY Bilateral   . PORT A CATH REVISION       OB History   No obstetric history on file.     Family History  Problem Relation Age of Onset  . Pancreatic cancer Mother   . Kidney cancer Sister 56  . Breast cancer Sister 81  . Breast cancer Other 9    Social History   Tobacco Use  . Smoking status: Light Tobacco Smoker  . Smokeless tobacco: Never Used  Substance Use Topics  . Alcohol use: Yes    Comment: Rarely  . Drug use: No    Home Medications Prior to Admission medications   Medication Sig Start Date End Date Taking? Authorizing Provider  ALPRAZolam Duanne Moron) 1 MG tablet Take 1 mg by mouth at bedtime as needed for sleep.  11/06/19   [provider]  Ascorbic Acid (  VITAMIN C PO) Take 1 tablet by mouth daily.    [provider]  cholecalciferol (VITAMIN D) 1000 units tablet Take 1 tablet (1,000 Units total) by mouth daily. Patient not taking: Reported on 11/19/2019 05/16/18   [name], [name], MD  dexamethasone (DECADRON) 4 MG tablet Take 2 mg by mouth 2 (two) times daily.  10/18/19   [name], [name], MD  gabapentin (NEURONTIN) 600 MG tablet Take 600 mg by mouth 3 (three) times daily.  11/14/19   [provider]  hydrocortisone (CORTEF) 20 MG tablet as  directed. 10/18/19   [provider]  magic mouthwash SOLN Take 10 mLs by mouth 4 (four) times daily -  before meals and at bedtime. Swish and spit. 10/18/19   [provider]  methadone (DOLOPHINE) 5 MG tablet Take 1 tablet (5 mg total) by mouth every 8 (eight) hours. 12/18/19   [name], [name], MD  Multiple Vitamin (MULTIVITAMIN) capsule Take 1 capsule by mouth daily.      [provider]  naloxone [facility]) nasal spray 4 mg/0.1 mL Administer single spray of naloxone into one nostril. Additionally, spray can be given every 2-3 minutes as needed until emergency medical assistance arrives. Call 911 immediately after use. 10/25/19   [name], [name], MD  omeprazole (PRILOSEC) 40 MG capsule Take 1 capsule (40 mg total) by mouth daily. 01/07/20   [name], [name], MD  Oxycodone HCl 20 MG TABS Take 20 mg by mouth 3 (three) times daily as needed (pain).  11/06/19   [provider]  polyethylene glycol (MIRALAX / GLYCOLAX) 17 g packet Take 17 g by mouth daily as needed for mild constipation. 11/21/19   Mercy Riding, MD  potassium chloride SA (KLOR-CON) 20 MEQ tablet Take 20 mEq by mouth daily. 10/28/19   [provider]  prochlorperazine (COMPAZINE) 10 MG tablet Take 1 tablet (10 mg total) by mouth every 6 (six) hours as needed for nausea or vomiting. 10/17/19   Tanner, Lyndon Code., PA-C  senna-docusate (SENOKOT-S) 8.6-50 MG tablet Take 1 tablet by mouth 2 (two) times daily as needed for moderate constipation. 11/21/19   Mercy Riding, MD  sertraline (ZOLOFT) 100 MG tablet TAKE 1 TABLET BY MOUTH EVERY DAY 12/23/19   [name], [name], MD  triamterene-hydrochlorothiazide (DYAZIDE) 37.5-25 MG capsule Take 1 each (1 capsule total) by mouth every morning. 07/30/19   [name], [name], MD  tucatinib (TUKYSA) 150 MG tablet Take 1 tablet (150 mg total) by mouth every morning. Take as directed by MD. 12/18/19   [name], [name], MD    Allergies    Tramadol,  Hydrocodone-acetaminophen, and Pork-derived products  Review of Systems   Review of Systems  Constitutional: Negative for fever.  HENT: Negative for sore throat.   Eyes: Negative for visual disturbance.  Respiratory: Negative for shortness of breath.   Cardiovascular: Negative for chest pain.  Gastrointestinal: Positive for abdominal pain (acid reflux ??). Negative for vomiting.  Genitourinary: Negative for dysuria.  Musculoskeletal: Negative for neck pain.  Skin: Negative for rash.  Neurological: Positive for dizziness and headaches.    Physical Exam Updated Vital Signs BP (!) 181/88   Pulse 90   Temp 98.8 F (37.1 C) (Oral)   Resp 18   SpO2 97%   Physical Exam Vitals and nursing note reviewed.  Constitutional:      General: She is not in acute distress.    Appearance: She is well-developed.  HENT:     Head:  Normocephalic and atraumatic.  Eyes:     Conjunctiva/sclera: Conjunctivae normal.  Cardiovascular:     Rate and Rhythm: Normal rate and regular rhythm.     Heart sounds: No murmur.  Pulmonary:     Effort: Pulmonary effort is normal. No respiratory distress.     Breath sounds: Wheezing present.  Abdominal:     Palpations: Abdomen is soft.     Tenderness: There is no abdominal tenderness.  Musculoskeletal:        General: No deformity or signs of injury. Normal range of motion.     Cervical back: Neck supple.  Skin:    General: Skin is warm and dry.     Capillary Refill: Capillary refill takes less than 2 seconds.  Neurological:     General: No focal deficit present.     Mental Status: She is alert. Mental status is at baseline.     ED Results / Procedures / Treatments   Labs (all labs ordered are listed, but only abnormal results are displayed) Labs Reviewed  BASIC METABOLIC PANEL - Abnormal; Notable for the following components:      Result Value   Glucose, Bld 154 (*)    All other components within normal limits  CBC WITH DIFFERENTIAL/PLATELET -  Abnormal; Notable for the following components:   WBC 21.0 (*)    RBC 3.78 (*)    Hemoglobin 10.7 (*)    HCT 35.2 (*)    RDW 18.0 (*)    Neutro Abs 17.6 (*)    Abs Immature Granulocytes 1.23 (*)    All other components within normal limits    EKG None  Radiology CT Head Wo Contrast  Result Date: 01/12/2020 CLINICAL DATA:  Headache, intracranial hemorrhage suspected EXAM: CT HEAD WITHOUT CONTRAST TECHNIQUE: Contiguous axial images were obtained from the base of the skull through the vertex without intravenous contrast. COMPARISON:  CT 11/19/2019, MR 09/04/2019 FINDINGS: Brain: Redemonstration ill-defined lesion with surrounding vasogenic edema in the right frontal lobe, the overall extent of which appears diminished from prior exam. Additional hypoattenuation in the right temporal pole compatible with a second known lesion as well. No new areas of edema. No evidence of acute infarction, hemorrhage, hydrocephalus, extra-axial collection or mass lesion/mass effect. Vascular: No hyperdense vessel or unexpected calcification. Skull: No calvarial fracture or suspicious osseous lesion. No scalp swelling or hematoma. Mild hyperostosis frontalis interna, a benign incidental finding Sinuses/Orbits: Paranasal sinuses and mastoid air cells are predominantly clear. Included orbital structures are unremarkable. Other: None IMPRESSION: 1. Ill-defined lesions in the right frontal and temporal lobes with surrounding vasogenic edema, compatible with known metastatic disease. Overall the extent of edema is diminished from prior exam. No new areas concerning for additional metastatic disease. 2. No other acute intracranial abnormality. Electronically Signed   By: Lovena Le [name]   On: 01/12/2020 20:44    Procedures Procedures (including critical care time)  Medications Ordered in ED Medications  HYDROmorphone (DILAUDID) injection 1 mg (1 mg Intravenous Given 01/12/20 2110)  heparin lock flush 100 unit/mL (500  Units Intracatheter Given 01/12/20 2229)    ED Course  I have reviewed the triage vital signs and the nursing notes.  Pertinent labs & imaging results that were available during my care of the patient were reviewed by me and considered in my medical decision making (see chart for details).  Clinical Course as of Jan 12 1001  Sun Jan 12, 2020  1955 Patient with metastatic lung here with dizziness headache.  She  says her doctor sent her here to get pain medicine.  I called the patient's husband who did not talk to the doctor but said she did and that is what he understood also.   [MB]  1957 Differential includes worsening tumor edema, bleed, dehydration, metabolic derangement.   [MB]  1957 It looks like she is seeing palliative care although is still actively getting chemo and is on steroids   [MB]  2047 Head CT showing improved edema and no new lesions.   [MB]  2157 Patient states she feels a little bit better.  She said that her oncologist sent her here to get a prescription of her oxycodone but that he takes care of her methadone prescription.  We will send a prescription off to [facility] on [address]   [MB]    Clinical Course User Index [MB] Hayden Rasmussen, MD   MDM Rules/Calculators/A&P                     Patient was reviewed under PDMP. Received oxycodone about 10 days ago. Patient states med was stolen. Shows me a police card with a case file number. Will refile this medication for 10 tabs.  Final Clinical Impression(s) / ED Diagnoses Final diagnoses:  Dizziness  Metastatic breast cancer [facility])    Rx / DC Orders ED Discharge Orders         Ordered    Oxycodone HCl 20 MG TABS  3 times daily PRN     01/12/20 2205           Hayden Rasmussen, MD 01/13/20 1005

## 2020-01-12 NOTE — Discharge Instructions (Addendum)
You were seen in the emergency department for dizziness and being out of one of your pain medicines.  You had blood work that showed an elevated white blood cell count but this is possibly due to your steroids.  Your CAT scan did not show any worsening findings of your brain.  Please contact your oncologist tomorrow for follow-up and further pain medication

## 2020-01-12 NOTE — ED Notes (Signed)
Pt transported to CT ?

## 2020-01-13 ENCOUNTER — Other Ambulatory Visit: Payer: Self-pay | Admitting: Hematology

## 2020-01-13 MED ORDER — PROCHLORPERAZINE MALEATE 10 MG PO TABS: 10.0000 mg | ORAL_TABLET | Freq: Four times a day (QID) | ORAL | 1 refills | Status: DC | PRN

## 2020-01-13 MED ORDER — ALPRAZOLAM 1 MG PO TABS: 1.0000 mg | ORAL_TABLET | Freq: Every evening | ORAL | 0 refills | Status: DC | PRN

## 2020-01-13 MED FILL — TUKYSA 150 MG TABS: 150 | 30 days supply | Qty: 60 | Fill #1

## 2020-01-14 NOTE — Progress Notes (Addendum)
[place]  [phone] [phone]     ID: Jlyn Cerros   DOB: 08-25-1956  MR#: [id number]  IBB#:[id number]  Patient Care Team: Chauncey Cruel, MD as PCP - General (Oncology) Ajax Schroll, [name], MD as Consulting Physician (Oncology) [name], MD as Consulting Physician (Radiation Oncology) [name], [name], MD as Consulting Physician (Cardiology) [name], [name], MD as Consulting Physician (Psychiatry) [name], MD as Consulting Physician (Anesthesiology) [name], MD as Consulting Physician (Internal Medicine) [name], [name] 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  She resumed her Abraxane/trastuzumab/Pertuzumab at her last visit on 12/18/2019.  She is due for repeat dose today.  She underwent echocardiogram on 12/25/2019 showing an ejection fraction of 60-65%.  She was scheduled for brain MRI on 01/07/2020, but she contacted [name] on 12/27/2019 stating she wanted to rescheduled because she was not ready.  At that time, she also informed us she had not started the tucatinib but that she would start that day (2/19).  She presented to the ED on 01/12/2020 with headache and dizziness. Head CT without contrast was performed at that time and showed: overall improvement in vasogenic edema; no new areas concerning for additional metastatic disease; no other acute intracranial abnormality.   REVIEW OF SYSTEMS: Marcee is very scattered today.  We asked her to bring all her medicines and she did.  It took my nurse approximately 1 hour to try to reconcile what she had there with the list we have in place and some medicines were not included.  It  is not clear to me exactly what Kaimana is taking at this point.  She does not seem to have been taking the tucatinib.  She denies unusual headaches visual changes nausea or vomiting.  She denies falls.  She tells me she has good support at home from her daughter although her daughter does work full-time.  She brought me a copy of her healthcare power of attorney and and that is separately filed.   BREAST CANCER HISTORY: From the original intake note:  The patient originally presented in May 2003 when she noticed a lump in the upper inner quadrant of her right breast in September 2003.  She sought attention and had a mammogram which showed an obvious carcinoma in the upper inner quadrant of the right breast, approximately 2 cm.  There were some enlarged lymph nodes in the axilla and an FNA done showed those consistent with malignant cells, most likely an invasive ductal carcinoma.  At that point she was unsure of what to do and was referred by Dr. [name] for a discussion of her treatment options.  By biopsy, it was ER/PR negative and HER-2 was 3+.  DNA index was 1.42.  We reiterated that it was most important to have her disease surgically addressed at that point and had recommended lumpectomy and axillary nodes to be addressed with which she followed through and on 04-03-02, Dr. [name] performed a right partial mastectomy and a right axillary lymph node dissection.  Final pathology revealed a 2.4 cm. high grade, Grade III invasive ductal carcinoma with an adjacent .8 cm. also high grade invasive ductal carcinoma which was felt to represent an intramammary metastasis rather than a second primary.  A smaller mass was just medial to the larger mass.  The smaller mass was associated with high grade DCIS component.  There was no definite lymphovascular invasion identified.  However, one of fourteen axillary lymph nodes did contain a 1.5 cm. metastatic deposit.  Postoperatively she did very well.  We  reiterated the fact that she did need adjuvant chemotherapy, however, she refused and decided that she would pursue radiation.  She received radiation and completed that on 08-14-02. Her subsequent history is as detailed below.   PAST MEDICAL HISTORY: Past Medical History:  Diagnosis Date  . Breast cancer ([place])   . DVT (deep venous thrombosis) ([place]) 10/07/2011  . Fever 02/06/2018  . Hypertension   . Radiation 11/29/2005-12/29/2005   right supraclavicular area 4147 cGy  . Radiation 06/26/02-08/14/02   right breast 5040 cGy, tumor bed boosted to 1260 cGy  . Rheumatic fever     PAST SURGICAL HISTORY: Past Surgical History:  Procedure Laterality Date  . BREAST SURGERY    . MASTECTOMY Bilateral   . PORT A CATH REVISION      FAMILY HISTORY Family History  Problem Relation Age of Onset  . Pancreatic cancer Mother   . Kidney cancer Sister 5  . Breast cancer Sister 39  . Breast cancer Other 18  The patient's father died in his 03O  from complications of alcohol abuse. The patient's mother died in her 74s from pancreatic cancer. The patient has 6 brothers, 2 sisters. Both her sisters have had breast cancer, both diagnosed after the age of 1. The patient has not had genetic testing so far.   GYNECOLOGIC HISTORY: Menarche age 64; first live birth age 64. She is GXP4. She is not sure when she stopped having periods. She never used hormone replacement.   SOCIAL HISTORY: The patient has worked in the past as a Psychologist, counselling. At home in addition to the patient are her husband [name], originally from [place], who works as a Administrator.  Also living in the home is her daughter [name] (she is studying to be a Marine scientist) and grandson.  The other 2 children are [name], who has a college degree in psychology and works as a Probation officer; and [name], who lives in [place] and works as a Higher education careers adviser. [name] also sometimes stays in the home. In addition the patient has an aide who helps her almost  daily.    ADVANCED DIRECTIVES: The patient has completed advanced directives and name her daughter [name] as her healthcare power of attorney.  The patient also completed a living will.  This is notarized.  These documents are being sent for scanning on epic on 01/15/2020  HEALTH MAINTENANCE: Social History   Tobacco Use  . Smoking status: Light Tobacco Smoker  . Smokeless tobacco: Never Used  Substance Use Topics  . Alcohol use: Yes    Comment: Rarely  . Drug use: No     Colonoscopy:  Not on file  PAP:  Not on file  Bone density:  Not on file  Lipid panel:  Not on file   Allergies  Allergen Reactions  . Tramadol Nausea Only  . Hydrocodone-Acetaminophen Itching and Rash  . Pork-Derived Products Other (See Comments)    Pt says she just doesn't eat pork; has no reaction to pork products    Current Outpatient Medications  Medication Sig Dispense Refill  . gabapentin (NEURONTIN) 100 MG capsule Take 100 mg by mouth 3 (three) times daily.     Marland Kitchen  methadone (DOLOPHINE) 5 MG tablet Take 1 tablet (5 mg total) by mouth every 8 (eight) hours. 90 tablet 0  . omeprazole (PRILOSEC) 40 MG capsule Take 1 capsule (40 mg total) by mouth daily. 30 capsule 1  . prochlorperazine (COMPAZINE) 10 MG tablet Take 1 tablet (10 mg total) by mouth every 6 (six) hours as needed for nausea or vomiting. 30 tablet 1  . sertraline (ZOLOFT) 100 MG tablet TAKE 1 TABLET BY MOUTH EVERY DAY 90 tablet 2  . triamterene-hydrochlorothiazide (DYAZIDE) 37.5-25 MG capsule Take 1 each (1 capsule total) by mouth every morning. 30 capsule 3  . tucatinib (TUKYSA) 150 MG tablet Take 1 tablet (150 mg total) by mouth every morning. Take as directed by MD. 60 tablet 3  . ALPRAZolam (XANAX) 1 MG tablet Take 1 tablet (1 mg total) by mouth at bedtime as needed for sleep. 15 tablet 0  . Ascorbic Acid (VITAMIN C PO) Take 1 tablet by mouth daily.    . cholecalciferol (VITAMIN D) 1000 units tablet Take 1 tablet  (1,000 Units total) by mouth daily. (Patient not taking: Reported on 11/19/2019) 90 tablet 4  . cholecalciferol (VITAMIN D3) 25 MCG (1000 UNIT) tablet Take 1,000 Units by mouth daily.    Marland Kitchen gabapentin (NEURONTIN) 300 MG capsule Take 300 mg by mouth 2 (two) times daily.    Marland Kitchen gabapentin (NEURONTIN) 600 MG tablet Take 600 mg by mouth 2 (two) times daily.    . magic mouthwash SOLN Take 10 mLs by mouth 4 (four) times daily as needed for mouth pain. Swish and spit.     . methadone (DOLOPHINE) 10 MG tablet Take 10 mg by mouth. Bottle states 1 tab 5 times a day with fill date of 01/11/2020 per Dr Mirna Mires    . Multiple Vitamin (MULTIVITAMIN) capsule Take 1 capsule by mouth daily.      . naloxone (NARCAN) nasal spray 4 mg/0.1 mL Administer single spray of naloxone into one nostril. Additionally, spray can be given every 2-3 minutes as needed until emergency medical assistance arrives. Call 911 immediately after use. (Patient not taking: Reported on 01/15/2020) 1 each 2  . Oxycodone HCl 20 MG TABS Take 1 tablet (20 mg total) by mouth 3 (three) times daily as needed (pain). 12 tablet 0  . polyethylene glycol (MIRALAX / GLYCOLAX) 17 g packet Take 17 g by mouth daily as needed for mild constipation. (Patient not taking: Reported on 01/15/2020) 14 each 0  . senna-docusate (SENOKOT-S) 8.6-50 MG tablet Take 1 tablet by mouth 2 (two) times daily as needed for moderate constipation. (Patient not taking: Reported on 01/15/2020) 60 tablet 1   No current facility-administered medications for this visit.   Facility-Administered Medications Ordered in Other Visits  Medication Dose Route Frequency Provider Last Rate Last Admin  . sodium chloride 0.9 % injection 10 mL  10 mL Intravenous PRN Roch Quach, [name], MD   10 mL at 03/04/14 1421  . sodium chloride flush (NS) 0.9 % injection 10 mL  10 mL Intracatheter PRN Emon Lance, [name], MD   10 mL at 07/30/19 1627  . sodium chloride flush (NS) 0.9 % injection 10 mL  10 mL  Intracatheter PRN Enrika Aguado, [name], MD   10 mL at 01/15/20 1511    OBJECTIVE: Middle-aged African-American woman   Vitals:   01/15/20 1004  BP: (!) 126/99  Pulse: 91  Resp: 18  Temp: 98.5 F (36.9 C)  SpO2: 99%   Wt Readings from Last 3 Encounters:  01/15/20 216 lb (98 kg)  12/18/19 203 lb 11.2 oz (92.4 kg)  11/19/19 190 lb 4.1 oz (86.3 kg)   Body mass index is 34.86 kg/m.    Sclerae unicteric, EOMs intact Wearing a mask No cervical or supraclavicular adenopathy Lungs no rales or rhonchi Heart regular rate and rhythm Abd soft, nontender, positive bowel sounds MSK no focal spinal tenderness, chronic right upper extremity lymphedema, which appears slightly decreased neuro: nonfocal, well oriented, appropriate affect Breasts: Status post bilateral mastectomy. Skin: The lesions on her back appear to be slightly improved or stable  Photo 07/04/2017     Photo 08/23/2018    LAB RESULTS:.  Lab Results  Component Value Date   WBC 19.1 (H) 01/15/2020   NEUTROABS 14.4 (H) 01/15/2020   HGB 11.4 (L) 01/15/2020   HCT 36.4 01/15/2020   MCV 91.2 01/15/2020   PLT 220 01/15/2020      Chemistry      Component Value Date/Time   NA 140 01/15/2020 1020   NA 142 11/09/2017 1214   K 4.2 01/15/2020 1020   K 3.9 11/09/2017 1214   CL 97 (L) 01/15/2020 1020   CL 101 03/07/2013 1411   CO2 30 01/15/2020 1020   CO2 32 (H) 11/09/2017 1214   BUN 27 (H) 01/15/2020 1020   BUN 19.1 11/09/2017 1214   CREATININE 0.99 01/15/2020 1020   CREATININE 1.0 11/09/2017 1214      Component Value Date/Time   CALCIUM 9.5 01/15/2020 1020   CALCIUM 9.1 11/09/2017 1214   ALKPHOS 84 01/15/2020 1020   ALKPHOS 120 11/09/2017 1214   AST 25 01/15/2020 1020   AST 30 11/09/2017 1214   ALT 42 01/15/2020 1020   ALT 19 11/09/2017 1214   BILITOT 0.3 01/15/2020 1020   BILITOT <0.22 11/09/2017 1214       STUDIES: CT Head Wo Contrast  Result Date: 01/12/2020 CLINICAL DATA:  Headache,  intracranial hemorrhage suspected EXAM: CT HEAD WITHOUT CONTRAST TECHNIQUE: Contiguous axial images were obtained from the base of the skull through the vertex without intravenous contrast. COMPARISON:  CT 11/19/2019, MR 09/04/2019 FINDINGS: Brain: Redemonstration ill-defined lesion with surrounding vasogenic edema in the right frontal lobe, the overall extent of which appears diminished from prior exam. Additional hypoattenuation in the right temporal pole compatible with a second known lesion as well. No new areas of edema. No evidence of acute infarction, hemorrhage, hydrocephalus, extra-axial collection or mass lesion/mass effect. Vascular: No hyperdense vessel or unexpected calcification. Skull: No calvarial fracture or suspicious osseous lesion. No scalp swelling or hematoma. Mild hyperostosis frontalis interna, a benign incidental finding Sinuses/Orbits: Paranasal sinuses and mastoid air cells are predominantly clear. Included orbital structures are unremarkable. Other: None IMPRESSION: 1. Ill-defined lesions in the right frontal and temporal lobes with surrounding vasogenic edema, compatible with known metastatic disease. Overall the extent of edema is diminished from prior exam. No new areas concerning for additional metastatic disease. 2. No other acute intracranial abnormality. Electronically Signed   By: Lovena Le [name]   On: 01/12/2020 20:44   ECHOCARDIOGRAM COMPLETE  Result Date: 12/25/2019    ECHOCARDIOGRAM REPORT   Patient Name:   ANVITHA HUTMACHER  Date of Exam: 12/25/2019 Medical Rec #:  [id number]     Height:       66.0 in Accession #:    [id number]    Weight:       203.7 lb Date of Birth:  Apr 06, 1956     BSA:  2.02 m Patient Age:    59 years      BP:           146/95 mmHg Patient Gender: F             HR:           91 bpm. Exam Location:  [address] Procedure: 2D Echo, 3D Echo, Cardiac Doppler, Color Doppler and Strain Analysis Indications:    C50.211 Breast cancer  History:         Patient has prior history of Echocardiogram examinations, most                 recent 07/17/2019. Cardiomyopathy, DVT; Risk Factors:Hypertension                 and Current Smoker. Breast cancer. Brain metastasis. COVID-19+                 11/19/19.  Sonographer:    Jessee Avers, RDCS Referring Phys: [place]  1. Left ventricular ejection fraction, by estimation, is 60 to 65%. The left ventricle has normal function. The left ventricle has no regional wall motion abnormalities. There is moderate concentric left ventricular hypertrophy. Left ventricular diastolic parameters are consistent with Grade I diastolic dysfunction (impaired relaxation). The average left ventricular global longitudinal strain is -20.0 %.  2. Right ventricular systolic function is normal. The right ventricular size is normal. There is moderately elevated pulmonary artery systolic pressure. The estimated right ventricular systolic pressure is 81.4 mmHg.  3. Left atrial size was mildly dilated.  4. The mitral valve is normal in structure and function. Mild mitral valve regurgitation. No evidence of mitral stenosis.  5. The aortic valve is normal in structure and function. Aortic valve regurgitation is mild. Mild to moderate aortic valve sclerosis/calcification is present, without any evidence of aortic stenosis.  6. The inferior vena cava is normal in size with greater than 50% respiratory variability, suggesting right atrial pressure of 3 mmHg. Comparison(s): 07/17/19 EF 55-60%. GLS -17.2%. FINDINGS  Left Ventricle: Left ventricular ejection fraction, by estimation, is 60 to 65%. The left ventricle has normal function. The left ventricle has no regional wall motion abnormalities. The average left ventricular global longitudinal strain is -20.0 %. The left ventricular internal cavity size was normal in size. There is moderate concentric left ventricular hypertrophy. Left ventricular diastolic parameters are consistent with  Grade I diastolic dysfunction (impaired relaxation). Normal left ventricular filling pressure. Right Ventricle: The right ventricular size is normal. No increase in right ventricular wall thickness. Right ventricular systolic function is normal. There is moderately elevated pulmonary artery systolic pressure. The tricuspid regurgitant velocity is 3.02 m/s, and with an assumed right atrial pressure of 8 mmHg, the estimated right ventricular systolic pressure is 48.1 mmHg. Left Atrium: Left atrial size was mildly dilated. Right Atrium: Right atrial size was normal in size. Pericardium: There is no evidence of pericardial effusion. Mitral Valve: The mitral valve is normal in structure and function. Normal mobility of the mitral valve leaflets. Mild mitral valve regurgitation. No evidence of mitral valve stenosis. Tricuspid Valve: The tricuspid valve is normal in structure. Tricuspid valve regurgitation is mild . No evidence of tricuspid stenosis. Aortic Valve: The aortic valve is normal in structure and function.. There is moderate thickening and moderate calcification of the aortic valve. Aortic valve regurgitation is mild. Mild to moderate aortic valve sclerosis/calcification is present, without any evidence of aortic stenosis. There is moderate thickening of  the aortic valve. There is moderate calcification of the aortic valve. Pulmonic Valve: The pulmonic valve was normal in structure. Pulmonic valve regurgitation is not visualized. No evidence of pulmonic stenosis. Aorta: The aortic root is normal in size and structure. Venous: The inferior vena cava is normal in size with greater than 50% respiratory variability, suggesting right atrial pressure of 3 mmHg. IAS/Shunts: No atrial level shunt detected by color flow Doppler.  LEFT VENTRICLE PLAX 2D LVIDd:         4.50 cm  Diastology LVIDs:         3.20 cm  LV e' lateral:   8.81 cm/s LV PW:         1.30 cm  LV E/e' lateral: 6.1 LV IVS:        1.30 cm  LV e' medial:     5.22 cm/s LVOT diam:     2.00 cm  LV E/e' medial:  10.3 LV SV:         68.17 ml LV SV Index:   24.43    2D Longitudinal Strain LVOT Area:     3.14 cm 2D Strain GLS (A2C):   -20.2 %                         2D Strain GLS (A3C):   -20.5 %                         2D Strain GLS (A4C):   -19.2 %                         2D Strain GLS Avg:     -20.0 %                          3D Volume EF:                         3D EF:        57 %                         LV EDV:       147 ml                         LV ESV:       63 ml                         LV SV:        84 ml RIGHT VENTRICLE RV Basal diam:  3.00 cm RV S prime:     15.80 cm/s TAPSE (M-mode): 2.9 cm RVSP:           44.5 mmHg LEFT ATRIUM             Index       RIGHT ATRIUM           Index LA diam:        4.50 cm 2.23 cm/m  RA Pressure: 8.00 mmHg LA Vol (A2C):   59.1 ml 29.32 ml/m RA Area:     9.44 cm LA Vol (A4C):   57.2 ml 28.38 ml/m RA Volume:   18.00 ml  8.93 ml/m LA Biplane Vol: 59.0 ml 29.27 ml/m  AORTIC VALVE LVOT Vmax:  116.00 cm/s LVOT Vmean:  83.800 cm/s LVOT VTI:    0.217 m  AORTA Ao Root diam: 3.40 cm Ao Asc diam:  3.40 cm MITRAL VALVE               TRICUSPID VALVE                            TR Peak grad:   36.5 mmHg                            TR Vmax:        302.00 cm/s MV E velocity: 53.80 cm/s  Estimated RAP:  8.00 mmHg MV A velocity: 67.20 cm/s  RVSP:           44.5 mmHg MV E/A ratio:  0.80                            SHUNTS                            Systemic VTI:  0.22 m                            Systemic Diam: 2.00 cm Ena Dawley MD Electronically signed by Ena Dawley MD Signature Date/Time: 12/25/2019/3:45:15 PM    Final      ASSESSMENT:  64 y.o. [place] woman with stage IV breast cancer manifested chiefly by loco-regional nodal disease (neck, chest) and skin involvement, without liver, bone, or brain metastases documented   (1) Status post right upper inner quadrant lumpectomy and sentinel lymph node sampling 03/04/2002 for 2  separate foci of invasive ductal carcinoma, mpT2 pN1 or stage IIB, both foci grade 3, both estrogen and progesterone receptor positive, both HercepTest 3+, Mib-1 56%  (2) Reexcision for margins 05/27/2002 showed no residual cancer in the breast.  (3) The patient refused adjuvant systemic therapy.  (4) Adjuvant radiation treatment completed 08/14/2002.  (5) recurrence in the right breast in 02/2004 showing a morphologically different tumor, again grade 3, again estrogen and progesterone receptor negative, with an MIB-1 of 14% and Herceptest 3+.  (5) Between 03/2004 and 07/2004 she received dose dense Doxorubicin/Cyclophosphamide x 4 given with trastuzumab, followed by weekly Docetaxel x 8, again given with trastuzumab.  (6) Right mastectomy 07/13/2004 showed scattered microscopic foci of residual disease over an area  greater than 5 cm. Margins were negative.  (7) Postoperative Docetaxel continued until 09/2004.  (8) Trastuzumab (Herceptin) given 08/2004 through 01/2012 with some brief interruptions.  RECURRENT/ STAGE IV DISEASE December 2006 (9) Isolated right cervical nodal recurrence 10/2005, treated with radiation to the right supraclavicular area (total 41.5 gray) completed 12/29/2005.  (10) Navelbine given together with Herceptin  11/2005 through 03/2006.  (9) Left mastectomy 02/13/2006 for ductal carcinoma in situ, grade 2, estrogen and progesterone receptor negative, with negative margins; 0 of 3 lymph nodes involved  (10) treated with Lapatinib and Capecitabine before 10/2009, for an unclear duration and with unclear results (cannot locate data on chart review).  (11) Status post right supraclavicular lymph node biopsy 09/2010 again positive for an invasive ductal carcinoma, estrogen and progesterone receptor negative, HER-2 positive by CISH with a ratio 4.25.  (12) Navelbine given together with Herceptin between 05/2011 and 11/2011.  (13) Carboplatin/ Gemcitabine/ Herceptin  given for 2 cycles, in 12/2011 and 01/2012.  (14) TDM-1 ([place]) started 02/2012. Last dose 10/02/2013 after which the patient discontinued treatment at her own discretion. Echo on December 2014 showed a well preserved ejection fraction.  (15) Deep vein thrombosis of the right upper extremity documented 04/20/2011.  She completed anticoagulant therapy with Coumadin on 03/25/2013.  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 Left chest Port-A-Cath placed on 04/08/2013; being flushed every 6 weeks  RIGHT UPPER EXTREMITY ANGIOSARCOMA VS BREAST CANCER: August 2016 (18) treated at [facility] [place] August 2016 with paclitaxel, trastuzumab and pertuzumab 1 cycle, afterwards referred to hospice  (19) under the care of hospice of [facility] August 2016 to 05/08/2016, when the patient opted for a second try at chemotherapy  (20) started low-dose Abraxane, trastuzumab and pertuzumab 06/07/2016, to be repeated every 4 weeks.   (a) pretreatment echocardiogram 06/06/2016 showed a 60-65% ejection fraction  (b) significant compliance problems compromise response to treatment  (c)  echocardiogram 02/20/17 LVEF 55-60%  (d) Abraxane discontinued 07/04/2017 because of possible neuropathy symptoms  (e) Abraxane resumed 09/27/2017  (f) echocardiogram 07/20/2017 showed an ejection fraction of 60-65%  (g) echo 01/22/2018 showed an ejection fraction of 50-55%  (h) CT scan of the chest 05/03/2018 shows no disease involving the lungs liver or lymph nodes, with stable subcutaneous masses as previously noted  (I) Abraxane discontinued August 2019 because of concerns regarding neuropathy-- gemcitabine substituted  (j) echocardiogram 08/20/2018 shows an ejection fraction in  the 55-60% range (done every 6 months)  (k) gemcitabine discontinued because of multiple symptoms, Abraxane restarted 09/11/2018, given once every 4 weeks  (l) echocardiogram 07/17/2019 shows a well-preserved ejection fraction at 55-60%  (m) restaging PET scan 08/20/2019 shows hypermetabolic adenopathy, subcutaneous malignancy as clinically appreciated, but no liver or lung parenchymal lesions   (21) Chronic pain secondary to known metastatic disease  (a) patient signed pain contract 10/19/2016  (b) as of 11/16/2016 receives her pain medications from Dr [name]  (c)  Dr [name] no longer able to prescribe as of February 2019  (d) all pain medications now through [facility] (as of April 2019)  (22) left breast and left axillary adenopathy noted clinically  (a) left axillary lymph node biopsy on 07/24/2019 benign  (23) BRAIN METASTASES: frontal and temporal lobe metastases noted on brain MRI 09/04/2019  (a) adjuvant radiation 09/11/2019-10/16/2019:    The brain was treated to 30 Gy in 10 fractions.   (b) tucatinib prescribed December 2020 at 150 mg daily because of methadone interaction, never started by the patient  (c) noncontrast head CT 01/12/2020 stable lesions/improved edema  (24) COVID-19 infection 2019-11-19   PLAN: Fairy continues to tolerate the Abraxane and anti-HER-2 immunotherapy well and it continues to control her peripheral disease.  I reviewed with her the fact that these drugs do not cross the blood-brain barrier.  She has brain metastases.  She has had what radiation she was able or willing to undergo and she really does not need to take the tucatinib if she wants to prolong her partial brain tumor remission.  Melanee was insistent on receiving pain medication and sedatives including Xanax.  These are largely being prescribed by other physicians.  We are prescribing gabapentin and methadone for her.  Again it is not clear  to me exactly what medications she  is taking at present.  Today we discussed fall precautions.  I do not know how safe she is at home but she tells me that she is fine there and that she has all the help she needs at present.  We have placed a referral to palliative care through hospice.  She will return in 4 weeks for her next cycle of anti-HER-2 treatment and Abraxane.  She knows to call for any other issue that may develop before that visit.  Total encounter time 35 minutes.*  ADDENDUM: Pharmacy alerted me that there has been a significant increase in the patient's weight and wondered if we needed to adjust the trastuzumab dose.  I am leaving the dose for this today but when the patient returns in April we will consider going up to 570 depending on her weight at that time.   Tahari Clabaugh, [name], MD  01/15/20 3:55 PM Medical Oncology and Hematology [facility] [place], [place] [address] Tel. [phone]    Fax. [phone]   I, Wilburn Mylar, am acting as scribe for Dr. [name]. Sitara Cashwell.  I, Lurline Del MD, have reviewed the above documentation for accuracy and completeness, and I agree with the above.   *Total Encounter Time as defined by the Centers for Medicare and Medicaid Services includes, in addition to the face-to-face time of a patient visit (documented in the note above) non-face-to-face time: obtaining and reviewing outside history, ordering and reviewing medications, tests or procedures, care coordination (communications with other health care professionals or caregivers) and documentation in the medical record.

## 2020-01-15 ENCOUNTER — Ambulatory Visit: Payer: Medicare Other

## 2020-01-15 ENCOUNTER — Inpatient Hospital Stay: Payer: Medicare Other | Attending: Oncology

## 2020-01-15 ENCOUNTER — Inpatient Hospital Stay: Payer: Medicare Other

## 2020-01-15 ENCOUNTER — Other Ambulatory Visit: Payer: Self-pay

## 2020-01-15 ENCOUNTER — Inpatient Hospital Stay (HOSPITAL_BASED_OUTPATIENT_CLINIC_OR_DEPARTMENT_OTHER): Payer: Medicare Other | Admitting: Oncology

## 2020-01-15 VITALS — BP 126/99 | HR 91 | Temp 98.5°F | Resp 18 | Ht 66.0 in | Wt 216.0 lb

## 2020-01-15 DIAGNOSIS — C4911 Malignant neoplasm of connective and soft tissue of right upper limb, including shoulder: Secondary | ICD-10-CM | POA: Diagnosis not present

## 2020-01-15 DIAGNOSIS — C7931 Secondary malignant neoplasm of brain: Secondary | ICD-10-CM | POA: Insufficient documentation

## 2020-01-15 DIAGNOSIS — C50211 Malignant neoplasm of upper-inner quadrant of right female breast: Secondary | ICD-10-CM

## 2020-01-15 DIAGNOSIS — Z5112 Encounter for antineoplastic immunotherapy: Secondary | ICD-10-CM | POA: Insufficient documentation

## 2020-01-15 DIAGNOSIS — G893 Neoplasm related pain (acute) (chronic): Secondary | ICD-10-CM | POA: Insufficient documentation

## 2020-01-15 DIAGNOSIS — Z170 Estrogen receptor positive status [ER+]: Secondary | ICD-10-CM | POA: Insufficient documentation

## 2020-01-15 DIAGNOSIS — Z5111 Encounter for antineoplastic chemotherapy: Secondary | ICD-10-CM | POA: Insufficient documentation

## 2020-01-15 DIAGNOSIS — Z86718 Personal history of other venous thrombosis and embolism: Secondary | ICD-10-CM | POA: Diagnosis not present

## 2020-01-15 DIAGNOSIS — Z923 Personal history of irradiation: Secondary | ICD-10-CM | POA: Insufficient documentation

## 2020-01-15 LAB — CMP (CANCER CENTER ONLY)
ALT: 42 U/L (ref 0–44)
AST: 25 U/L (ref 15–41)
Albumin: 3.5 g/dL (ref 3.5–5.0)
Alkaline Phosphatase: 84 U/L (ref 38–126)
Anion gap: 13 (ref 5–15)
BUN: 27 mg/dL — ABNORMAL HIGH (ref 8–23)
CO2: 30 mmol/L (ref 22–32)
Calcium: 9.5 mg/dL (ref 8.9–10.3)
Chloride: 97 mmol/L — ABNORMAL LOW (ref 98–111)
Creatinine: 0.99 mg/dL (ref 0.44–1.00)
GFR, Est AFR Am: >60 mL/min (ref >60)
GFR, Estimated: >60 mL/min (ref >60)
Glucose, Bld: 136 mg/dL — ABNORMAL HIGH (ref 70–99)
Potassium: 4.2 mmol/L (ref 3.5–5.1)
Sodium: 140 mmol/L (ref 135–145)
Total Bilirubin: 0.3 mg/dL (ref 0.3–1.2)
Total Protein: 7.1 g/dL (ref 6.5–8.1)

## 2020-01-15 LAB — CBC WITH DIFFERENTIAL (CANCER CENTER ONLY)
Abs Immature Granulocytes: 1.7 10*3/uL — ABNORMAL HIGH (ref 0.00–0.07)
Basophils Absolute: 0.1 10*3/uL (ref 0.0–0.1)
Basophils Relative: 1 %
Eosinophils Absolute: 0 10*3/uL (ref 0.0–0.5)
Eosinophils Relative: 0 %
HCT: 36.4 % (ref 36.0–46.0)
Hemoglobin: 11.4 g/dL — ABNORMAL LOW (ref 12.0–15.0)
Immature Granulocytes: 9 %
Lymphocytes Relative: 6 %
Lymphs Abs: 1.2 10*3/uL (ref 0.7–4.0)
MCH: 28.6 pg (ref 26.0–34.0)
MCHC: 31.3 g/dL (ref 30.0–36.0)
MCV: 91.2 fL (ref 80.0–100.0)
Monocytes Absolute: 1.7 10*3/uL — ABNORMAL HIGH (ref 0.1–1.0)
Monocytes Relative: 9 %
Neutro Abs: 14.4 10*3/uL — ABNORMAL HIGH (ref 1.7–7.7)
Neutrophils Relative %: 75 %
Platelet Count: 220 10*3/uL (ref 150–400)
RBC: 3.99 MIL/uL (ref 3.87–5.11)
RDW: 17.7 % — ABNORMAL HIGH (ref 11.5–15.5)
WBC Count: 19.1 10*3/uL — ABNORMAL HIGH (ref 4.0–10.5)
nRBC: 0.9 % — ABNORMAL HIGH (ref 0.0–0.2)

## 2020-01-15 MED ORDER — SODIUM CHLORIDE 0.9 % IV SOLN
Freq: Once | INTRAVENOUS | Status: AC
  Filled 2020-01-15: qty 250

## 2020-01-15 MED ORDER — DIPHENHYDRAMINE HCL 25 MG PO CAPS
25.0000 mg | ORAL_CAPSULE | Freq: Once | ORAL | Status: AC
  Administered 2020-01-15: 25 mg via ORAL

## 2020-01-15 MED ORDER — PROCHLORPERAZINE MALEATE 10 MG PO TABS
ORAL_TABLET | ORAL | Status: AC
  Filled 2020-01-15: qty 1

## 2020-01-15 MED ORDER — ACETAMINOPHEN 325 MG PO TABS
650.0000 mg | ORAL_TABLET | Freq: Once | ORAL | Status: AC
  Administered 2020-01-15: 650 mg via ORAL

## 2020-01-15 MED ORDER — HEPARIN SOD (PORK) LOCK FLUSH 100 UNIT/ML IV SOLN
500.0000 [IU] | Freq: Once | INTRAVENOUS | Status: AC | PRN
  Administered 2020-01-15: 500 [IU]
  Filled 2020-01-15: qty 5

## 2020-01-15 MED ORDER — ACETAMINOPHEN 325 MG PO TABS
ORAL_TABLET | ORAL | Status: AC
  Filled 2020-01-15: qty 2

## 2020-01-15 MED ORDER — PROCHLORPERAZINE MALEATE 10 MG PO TABS
10.0000 mg | ORAL_TABLET | Freq: Once | ORAL | Status: AC
  Administered 2020-01-15: 10 mg via ORAL

## 2020-01-15 MED ORDER — SODIUM CHLORIDE 0.9 % IV SOLN
420.0000 mg | Freq: Once | INTRAVENOUS | Status: AC
  Administered 2020-01-15: 420 mg via INTRAVENOUS
  Filled 2020-01-15: qty 14

## 2020-01-15 MED ORDER — DIPHENHYDRAMINE HCL 25 MG PO CAPS
ORAL_CAPSULE | ORAL | Status: AC
  Filled 2020-01-15: qty 1

## 2020-01-15 MED ORDER — SODIUM CHLORIDE 0.9% FLUSH
10.0000 mL | INTRAVENOUS | Status: DC | PRN
  Administered 2020-01-15: 10 mL
  Filled 2020-01-15: qty 10

## 2020-01-15 MED ORDER — METHADONE HCL 5 MG PO TABS: 5.0000 mg | ORAL_TABLET | Freq: Three times a day (TID) | ORAL | 0 refills | Status: DC

## 2020-01-15 MED ORDER — PACLITAXEL PROTEIN-BOUND CHEMO INJECTION 100 MG
100.0000 mg/m2 | Freq: Once | INTRAVENOUS | Status: AC
  Administered 2020-01-15: 200 mg via INTRAVENOUS
  Filled 2020-01-15: qty 40

## 2020-01-15 MED ORDER — TRASTUZUMAB-ANNS CHEMO 150 MG IV SOLR
6.0000 mg/kg | Freq: Once | INTRAVENOUS | Status: AC
  Administered 2020-01-15: 525 mg via INTRAVENOUS
  Filled 2020-01-15: qty 25

## 2020-01-15 NOTE — Patient Instructions (Signed)
[facility] Discharge Instructions for Patients Receiving Chemotherapy  Today you received the following chemotherapy agents: Trastuzumab-anns (Kanjinti), Pertuzumab (Perjeta), and Paclitaxel protein-bound (Abraxane)  To help prevent nausea and vomiting after your treatment, we encourage you to take your nausea medication as directed by your provider.    If you develop nausea and vomiting that is not controlled by your nausea medication, call the clinic.   BELOW ARE SYMPTOMS THAT SHOULD BE REPORTED IMMEDIATELY:  *FEVER GREATER THAN 100.5 F  *CHILLS WITH OR WITHOUT FEVER  NAUSEA AND VOMITING THAT IS NOT CONTROLLED WITH YOUR NAUSEA MEDICATION  *UNUSUAL SHORTNESS OF BREATH  *UNUSUAL BRUISING OR BLEEDING  TENDERNESS IN MOUTH AND THROAT WITH OR WITHOUT PRESENCE OF ULCERS  *URINARY PROBLEMS  *BOWEL PROBLEMS  UNUSUAL RASH Items with * indicate a potential emergency and should be followed up as soon as possible.  Feel free to call the clinic should you have any questions or concerns. The clinic phone number is [phone].  Please show the [place] at check-in to the Emergency Department and triage nurse.

## 2020-01-15 NOTE — Progress Notes (Signed)
Patient has had weight gain. Leave trastutuzumab at current dose today per Dr. Jana Hakim.

## 2020-01-16 ENCOUNTER — Emergency Department (HOSPITAL_BASED_OUTPATIENT_CLINIC_OR_DEPARTMENT_OTHER): Payer: Medicare Other

## 2020-01-16 ENCOUNTER — Other Ambulatory Visit: Payer: Self-pay

## 2020-01-16 ENCOUNTER — Other Ambulatory Visit: Payer: Self-pay | Admitting: Oncology

## 2020-01-16 ENCOUNTER — Emergency Department (HOSPITAL_COMMUNITY): Payer: Medicare Other

## 2020-01-16 ENCOUNTER — Emergency Department (HOSPITAL_COMMUNITY)
Admission: EM | Admit: 2020-01-16 | Discharge: 2020-01-16 | Disposition: A | Discharge status: Home / Self Care | Payer: Medicare Other | Attending: Emergency Medicine | Admitting: Emergency Medicine

## 2020-01-16 ENCOUNTER — Encounter (HOSPITAL_COMMUNITY): Payer: Self-pay

## 2020-01-16 ENCOUNTER — Telehealth: Payer: Self-pay | Admitting: *Deleted

## 2020-01-16 ENCOUNTER — Encounter: Payer: Self-pay | Admitting: *Deleted

## 2020-01-16 DIAGNOSIS — R410 Disorientation, unspecified: Secondary | ICD-10-CM | POA: Insufficient documentation

## 2020-01-16 DIAGNOSIS — R400 Somnolence: Secondary | ICD-10-CM | POA: Insufficient documentation

## 2020-01-16 DIAGNOSIS — M7989 Other specified soft tissue disorders: Secondary | ICD-10-CM

## 2020-01-16 DIAGNOSIS — C50919 Malignant neoplasm of unspecified site of unspecified female breast: Secondary | ICD-10-CM

## 2020-01-16 DIAGNOSIS — R609 Edema, unspecified: Secondary | ICD-10-CM

## 2020-01-16 DIAGNOSIS — I10 Essential (primary) hypertension: Secondary | ICD-10-CM | POA: Insufficient documentation

## 2020-01-16 DIAGNOSIS — Z8616 Personal history of covid-19: Secondary | ICD-10-CM | POA: Diagnosis not present

## 2020-01-16 DIAGNOSIS — C7931 Secondary malignant neoplasm of brain: Secondary | ICD-10-CM | POA: Insufficient documentation

## 2020-01-16 DIAGNOSIS — Z79899 Other long term (current) drug therapy: Secondary | ICD-10-CM

## 2020-01-16 DIAGNOSIS — C50911 Malignant neoplasm of unspecified site of right female breast: Secondary | ICD-10-CM | POA: Insufficient documentation

## 2020-01-16 DIAGNOSIS — F17200 Nicotine dependence, unspecified, uncomplicated: Secondary | ICD-10-CM | POA: Insufficient documentation

## 2020-01-16 DIAGNOSIS — R2243 Localized swelling, mass and lump, lower limb, bilateral: Secondary | ICD-10-CM | POA: Insufficient documentation

## 2020-01-16 LAB — CBC WITH DIFFERENTIAL/PLATELET
Abs Immature Granulocytes: 0.87 10*3/uL — ABNORMAL HIGH (ref 0.00–0.07)
Basophils Absolute: 0.1 10*3/uL (ref 0.0–0.1)
Basophils Relative: 0 %
Eosinophils Absolute: 0 10*3/uL (ref 0.0–0.5)
Eosinophils Relative: 0 %
HCT: 35.5 % — ABNORMAL LOW (ref 36.0–46.0)
Hemoglobin: 11 g/dL — ABNORMAL LOW (ref 12.0–15.0)
Immature Granulocytes: 7 %
Lymphocytes Relative: 4 %
Lymphs Abs: 0.5 10*3/uL — ABNORMAL LOW (ref 0.7–4.0)
MCH: 28.3 pg (ref 26.0–34.0)
MCHC: 31 g/dL (ref 30.0–36.0)
MCV: 91.3 fL (ref 80.0–100.0)
Monocytes Absolute: 0.2 10*3/uL (ref 0.1–1.0)
Monocytes Relative: 2 %
Neutro Abs: 11.7 10*3/uL — ABNORMAL HIGH (ref 1.7–7.7)
Neutrophils Relative %: 87 %
Platelets: 202 10*3/uL (ref 150–400)
RBC: 3.89 MIL/uL (ref 3.87–5.11)
RDW: 17.6 % — ABNORMAL HIGH (ref 11.5–15.5)
WBC: 13.4 10*3/uL — ABNORMAL HIGH (ref 4.0–10.5)
nRBC: 0.6 % — ABNORMAL HIGH (ref 0.0–0.2)

## 2020-01-16 LAB — COMPREHENSIVE METABOLIC PANEL
ALT: 37 U/L (ref 0–44)
AST: 36 U/L (ref 15–41)
Albumin: 3.6 g/dL (ref 3.5–5.0)
Alkaline Phosphatase: 64 U/L (ref 38–126)
Anion gap: 12 (ref 5–15)
BUN: 24 mg/dL — ABNORMAL HIGH (ref 8–23)
CO2: 29 mmol/L (ref 22–32)
Calcium: 9.6 mg/dL (ref 8.9–10.3)
Chloride: 99 mmol/L (ref 98–111)
Creatinine, Ser: 1.04 mg/dL — ABNORMAL HIGH (ref 0.44–1.00)
GFR calc Af Amer: >60 mL/min (ref >60)
GFR calc non Af Amer: 57 mL/min — ABNORMAL LOW (ref >60)
Glucose, Bld: 145 mg/dL — ABNORMAL HIGH (ref 70–99)
Potassium: 5 mmol/L (ref 3.5–5.1)
Sodium: 140 mmol/L (ref 135–145)
Total Bilirubin: 0.6 mg/dL (ref 0.3–1.2)
Total Protein: 6.9 g/dL (ref 6.5–8.1)

## 2020-01-16 LAB — URINALYSIS, ROUTINE W REFLEX MICROSCOPIC
Bacteria, UA: NONE SEEN
Bilirubin Urine: NEGATIVE
Glucose, UA: NEGATIVE mg/dL
Hgb urine dipstick: NEGATIVE
Ketones, ur: NEGATIVE mg/dL
Nitrite: NEGATIVE
Protein, ur: NEGATIVE mg/dL
Specific Gravity, Urine: 1.011 (ref 1.005–1.030)
pH: 7 (ref 5.0–8.0)

## 2020-01-16 NOTE — Progress Notes (Signed)
Bilateral lower extremity venous duplex exam completed.  Preliminary results can be found under CV proc under chart review.  01/16/2020 11:54 AM  Shahzain Kiester, K., RDMS, RVT

## 2020-01-16 NOTE — ED Provider Notes (Signed)
[facility] DEPT Provider Note   CSN: [id number] Arrival date & time: 01/16/20  1016     History Chief Complaint  Patient presents with  . Foot Swelling    [name] is a 63 y.o. female.  [name] is a 64 year old woman presenting to the ED with 2 days of bilateral lower extremity swelling and foot pain.  She is a previous medical history significant for breast cancer with metastases to brain, angiosarcoma, previous DVT.  She reports that she has been experiencing lower extremity swelling for roughly the past 2 days.  This did not bother her much until last night when she started to experience significant foot pain bilaterally.  The pain seems to be primarily on the inside of her left foot with minimal pain on her right foot.  due to this foot pain, she is having significant difficulty sleeping overnight.  She reports that she took an extra large dose of her methadone today to help her sleep.  She denies fever, new cough, breathing changes, nausea, vomiting, dysuria.        Past Medical History:  Diagnosis Date  . Breast cancer ([place])   . DVT (deep venous thrombosis) ([place]) 10/07/2011  . Fever 02/06/2018  . Hypertension   . Radiation 11/29/2005-12/29/2005   right supraclavicular area 4147 cGy  . Radiation 06/26/02-08/14/02   right breast 5040 cGy, tumor bed boosted to 1260 cGy  . Rheumatic fever     Patient Active Problem List   Diagnosis Date Noted  . Drug-induced polyneuropathy ([place]) 12/19/2019  . Acute respiratory failure with hypoxia ([place]) 11/19/2019  . COVID-19 virus infection 11/19/2019  . Overdose opiate, accidental or unintentional, initial encounter ([place]) 11/19/2019  . Acute encephalopathy 11/19/2019  . Metastasis to brain ([place]) 09/13/2019  . Acute lower UTI 02/06/2018  . Altered mental status 02/06/2018  . Bacteremia 02/06/2018  . Polypharmacy 02/06/2018  . Goals of care, counseling/discussion 09/05/2017  . Pneumonia  01/25/2017  . CAP (community acquired pneumonia) 01/23/2017  . Port catheter in place 05/30/2016  . 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 Chronic pain 04/13/2015  . Left arm pain 08/18/2014  . Malignant neoplasm of upper-inner quadrant of right breast in female, estrogen receptor positive ([place]) 10/03/2013  . Post-lymphadenectomy lymphedema of arm 08/15/2013  . Port or reservoir infection 01/29/2013  . DVT (deep venous thrombosis) ([place]) 10/07/2011  . Chest pain 09/10/2009  . Secondary cardiomyopathy ([place]) 09/02/2009  . Essential hypertension 02/26/2009  . GERD 02/26/2009    Past Surgical History:  Procedure Laterality Date  . BREAST SURGERY    . MASTECTOMY Bilateral   . PORT A CATH REVISION       OB History   No obstetric history on file.     Family History  Problem Relation Age of Onset  . Pancreatic cancer Mother   . Kidney cancer Sister 62  . Breast cancer Sister 41  . Breast cancer Other 72    Social History   Tobacco Use  . Smoking status: Light Tobacco Smoker  . Smokeless tobacco: Never Used  Substance Use Topics  . Alcohol use: Yes    Comment: Rarely  . Drug use: No    Home Medications Prior to Admission medications   Medication Sig Start Date End Date Taking? Authorizing Provider  ALPRAZolam Duanne Moron) 1 MG tablet Take 1 tablet (1 mg total) by mouth at bedtime as needed for sleep. 01/13/20   Truitt Merle, MD  Ascorbic Acid (VITAMIN  C PO) Take 1 tablet by mouth daily.    [provider]  cholecalciferol (VITAMIN D) 1000 units tablet Take 1 tablet (1,000 Units total) by mouth daily. Patient not taking: Reported on 11/19/2019 05/16/18   [name], [name], MD  cholecalciferol (VITAMIN D3) 25 MCG (1000 UNIT) tablet Take 1,000 Units by mouth daily.    [provider]  gabapentin (NEURONTIN) 100 MG capsule Take 100 mg by mouth 3 (three) times daily.  01/01/20   [provider]  gabapentin (NEURONTIN) 300 MG capsule  Take 300 mg by mouth 2 (two) times daily.    [provider]  gabapentin (NEURONTIN) 600 MG tablet Take 600 mg by mouth 2 (two) times daily.    Sonia Side., FNP  magic mouthwash SOLN Take 10 mLs by mouth 4 (four) times daily as needed for mouth pain. Swish and spit.  10/18/19   [provider]  methadone (DOLOPHINE) 10 MG tablet Take 10 mg by mouth. Bottle states 1 tab 5 times a day with fill date of 01/11/2020 per Dr Mirna Mires    [provider]  methadone (DOLOPHINE) 5 MG tablet Take 1 tablet (5 mg total) by mouth every 8 (eight) hours. 01/15/20   [name], [name], MD  Multiple Vitamin (MULTIVITAMIN) capsule Take 1 capsule by mouth daily.      [provider]  naloxone [facility]) nasal spray 4 mg/0.1 mL Administer single spray of naloxone into one nostril. Additionally, spray can be given every 2-3 minutes as needed until emergency medical assistance arrives. Call 911 immediately after use. Patient not taking: Reported on 01/15/2020 10/25/19   [name], [name], MD  omeprazole (PRILOSEC) 40 MG capsule Take 1 capsule (40 mg total) by mouth daily. 01/07/20   [name], [name], MD  Oxycodone HCl 20 MG TABS Take 1 tablet (20 mg total) by mouth 3 (three) times daily as needed (pain). 01/12/20   Hayden Rasmussen, MD  polyethylene glycol (MIRALAX / GLYCOLAX) 17 g packet Take 17 g by mouth daily as needed for mild constipation. Patient not taking: Reported on 01/15/2020 11/21/19   Mercy Riding, MD  prochlorperazine (COMPAZINE) 10 MG tablet Take 1 tablet (10 mg total) by mouth every 6 (six) hours as needed for nausea or vomiting. 01/13/20   Truitt Merle, MD  senna-docusate (SENOKOT-S) 8.6-50 MG tablet Take 1 tablet by mouth 2 (two) times daily as needed for moderate constipation. Patient not taking: Reported on 01/15/2020 11/21/19   Mercy Riding, MD  sertraline (ZOLOFT) 100 MG tablet TAKE 1 TABLET BY MOUTH EVERY DAY 12/23/19   [name], [name], MD    triamterene-hydrochlorothiazide (DYAZIDE) 37.5-25 MG capsule Take 1 each (1 capsule total) by mouth every morning. 07/30/19   [name], [name], MD  tucatinib (TUKYSA) 150 MG tablet Take 1 tablet (150 mg total) by mouth every morning. Take as directed by MD. 12/18/19   [name], [name], MD    Allergies    Tramadol, Hydrocodone-acetaminophen, and Pork-derived products  Review of Systems   Review of Systems  Constitutional: Negative for activity change and fever.  HENT: Negative for congestion and facial swelling.   Eyes: Negative for visual disturbance.  Respiratory: Positive for cough (chronic). Negative for chest tightness, shortness of breath and wheezing.   Cardiovascular: Positive for leg swelling. Negative for chest pain and palpitations.  Gastrointestinal: Positive for abdominal distention. Negative for abdominal pain, diarrhea and vomiting.  Genitourinary: Negative for dysuria.  Skin: Negative for pallor and  wound.  Neurological: Negative for weakness and headaches.  Psychiatric/Behavioral: Positive for confusion.    Physical Exam Updated Vital Signs BP (!) 129/109 (BP Location: Left Arm)   Pulse (!) 107   Temp 98.9 F (37.2 C) (Oral)   Resp 18   Ht 5\' 6"  (1.676 m)   Wt 98 kg   SpO2 96%   BMI 34.86 kg/m   Physical Exam Constitutional:      General: She is not in acute distress.    Appearance: She is obese. She is not toxic-appearing.     Comments: Very sleepy during our interaction.  Twice during the interview, she fell asleep needs to be woken up.  HENT:     Head: Normocephalic and atraumatic.     Nose: Nose normal. No congestion.     Mouth/Throat:     Mouth: Mucous membranes are moist.  Eyes:     Extraocular Movements: Extraocular movements intact.     Conjunctiva/sclera: Conjunctivae normal.     Pupils: Pupils are equal, round, and reactive to light.  Cardiovascular:     Rate and Rhythm: Normal rate and regular rhythm.     Pulses: Normal pulses.      Heart sounds: Normal heart sounds.  Pulmonary:     Effort: Pulmonary effort is normal.     Breath sounds: Rhonchi present. No wheezing.  Abdominal:     General: Bowel sounds are normal.     Palpations: Abdomen is soft.     Comments: No edema appreciated during abdominal exam.  No fluid wave.  Musculoskeletal:        General: Swelling and tenderness present. No deformity or signs of injury.     Cervical back: Normal range of motion and neck supple.     Right lower leg: Edema present.     Left lower leg: Edema present.     Comments: Significant swelling in the lower extremities bilaterally.  Right greater than left.  No significant tenderness to palpation of feet bilaterally.  Skin:    General: Skin is warm and dry.  Neurological:     General: No focal deficit present.     Comments: Very drowsy during our interaction.  Mildly confused during our interaction.     ED Results / Procedures / Treatments   Labs (all labs ordered are listed, but only abnormal results are displayed) Labs Reviewed  CBC WITH DIFFERENTIAL/PLATELET  COMPREHENSIVE METABOLIC PANEL    EKG None  Radiology No results found.  Procedures Procedures (including critical care time)  Medications Ordered in ED Medications - No data to display  ED Course  I have reviewed the triage vital signs and the nursing notes.  Pertinent labs & imaging results that were available during my care of the patient were reviewed by me and considered in my medical decision making (see chart for details).    MDM Rules/Calculators/A&P                      Jovana Ill is a 64 year old woman presenting to the ED with 2 days of bilateral lower extremity swelling and foot pain.  She is a previous medical history significant for breast cancer with metastases to brain, angiosarcoma, previous DVT.  With regard to her lower extremity swelling, she has significant risk factors for DVT.  We will move forward with a lower extremity  Doppler and routine labs.  Chart review demonstrated a persistent leukocytosis over the past 2 months.  No clear sign of  infection today.  Possible chemotherapy/oncologic therapy side effect?  We will move forward with a chest x-ray due to mild respiratory difficulties (more likely secondary to taking more methadone than prescribed).  Currently able to protect her airways and breathing comfortably.    Final Clinical Impression(s) / ED Diagnoses Final diagnoses:  None    Rx / DC Orders ED Discharge Orders    None       Isla Pence, MD 01/18/20 [phone]

## 2020-01-16 NOTE — Telephone Encounter (Signed)
Received phone message from [facility] making [name] aware that pt is getting pain med-Methadone from two different sources-Dr [name] & Dr Mirna Mires.  Pt p/u Methadone 10 mg on Monday & [name] prescribed 5 mg 3/10.  They suggest that we collaborate with Dr Mora Bellman management.  Message to [name].

## 2020-01-16 NOTE — Discharge Instructions (Signed)
You came to the emergency room with right leg swelling.  Our biggest concern was to rule out a blood clot in your leg.  We were able to rule out the possibility of a blood clot with an ultrasound of your leg.  It is difficult to say what exactly is the cause of your swelling but we do not think it is dangerous.  At this point, it may be helpful for you to buy compression hose or tight fitting socks to help with the leg swelling.  You can buy compression socks online or even at [email or web address].  I recommend looking for something with a graded compression and says at least 15 mmHg.  It will also be important to only take your medication as prescribed.  Taking large doses of your pain medication can be extremely dangerous.

## 2020-01-16 NOTE — ED Notes (Signed)
[name] at bedside, will collect labs when they are finished.

## 2020-01-16 NOTE — ED Triage Notes (Signed)
Patient c/o bilateral foot swelling x 3 days. R>Lfoot. Patient states she was Covid + 12/18/19. Patient states she was tested within the past 14 days and was still Covid +

## 2020-01-17 ENCOUNTER — Other Ambulatory Visit: Payer: Self-pay | Admitting: Oncology

## 2020-01-18 IMAGING — MR MR HEAD WO/W CM
8 of 13 series · 31 of 48 positions shown · IV contrast (gadavist)
Comparison: Head CT 02/06/2018

Brain MRI 02/26/2008

CLINICAL DATA: Breast cancer staging with worsening headaches

EXAM:
MRI HEAD WITHOUT AND WITH CONTRAST
TECHNIQUE: Multiplanar, multiecho pulse sequences of the brain and surrounding
structures were obtained without and with intravenous contrast.
CONTRAST:  10mL GADAVIST GADOBUTROL 1 MMOL/ML IV SOLN

[Series 4: DWI · axial · 3.0mm · 1.09mm/px · z∈[-21,+112]mm · 6 of 92 slices shown (1 of 4)]
[im 1/92]
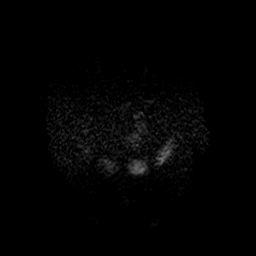
[im 19/92]
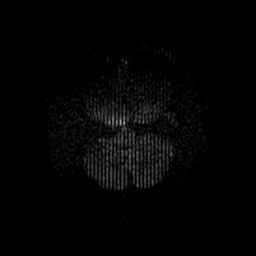
[im 37/92]
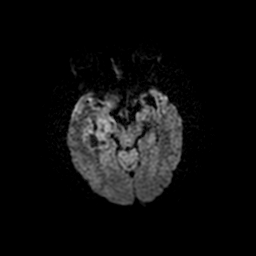
[im 55/92]
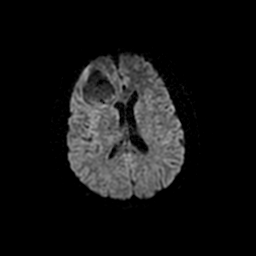
[im 73/92]
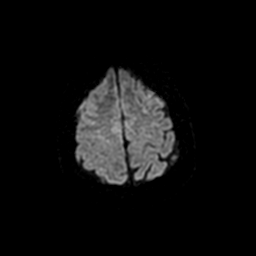
[im 92/92]
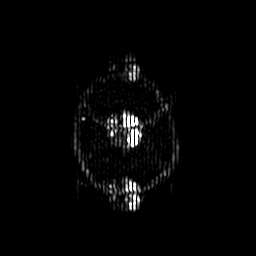

[Series 6: FLAIR · axial · 3.0mm · 0.45mm/px · z∈[-26,+110]mm · 2 of 24 slices shown]
[im 1/24]
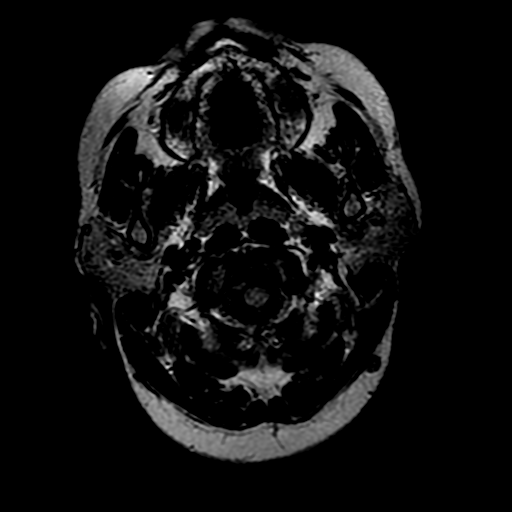
[im 24/24]
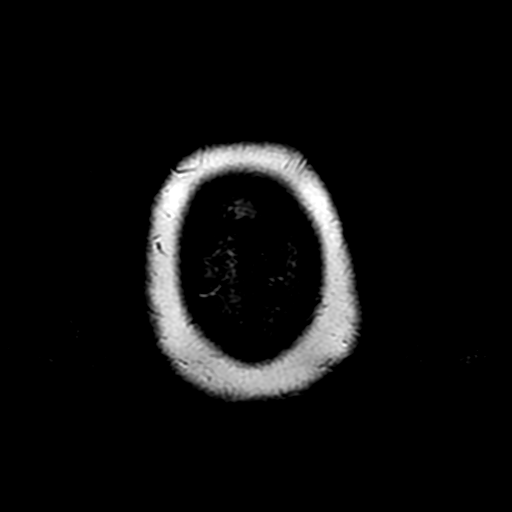

[Series 9: DWI · coronal · 4.0mm · 1.09mm/px · 6 of 82 slices shown (2 of 4)]
[im 1/82]
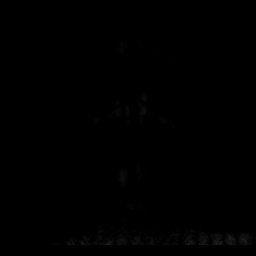
[im 17/82]
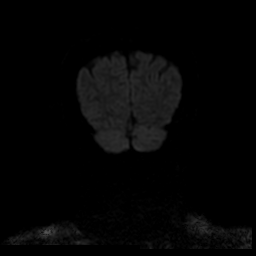
[im 33/82]
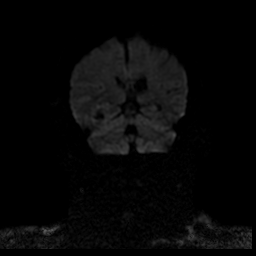
[im 49/82]
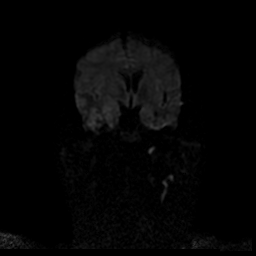
[im 65/82]
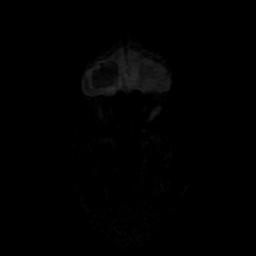
[im 82/82]
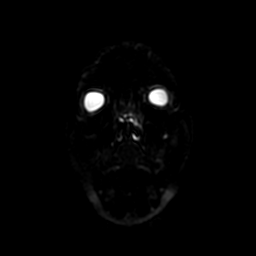

[Series 11: T1 post-contrast · axial · 1.0mm · 0.47mm/px · z∈[-24,+109]mm · 8 of 136 slices shown (1 of 3)]
[im 1/136]
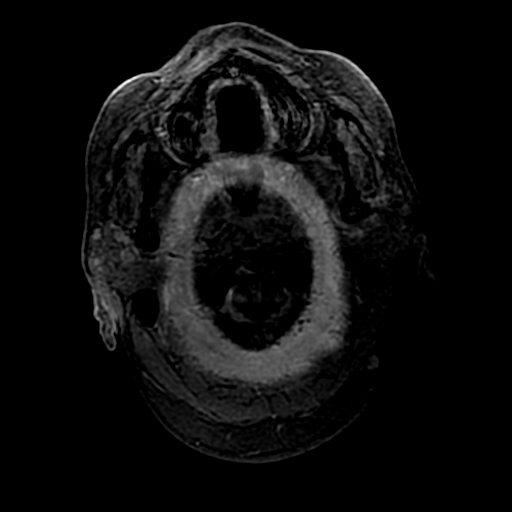
[im 17/136]
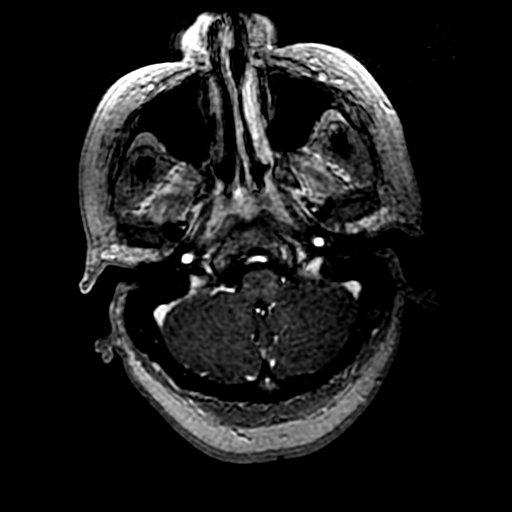
[im 34/136]
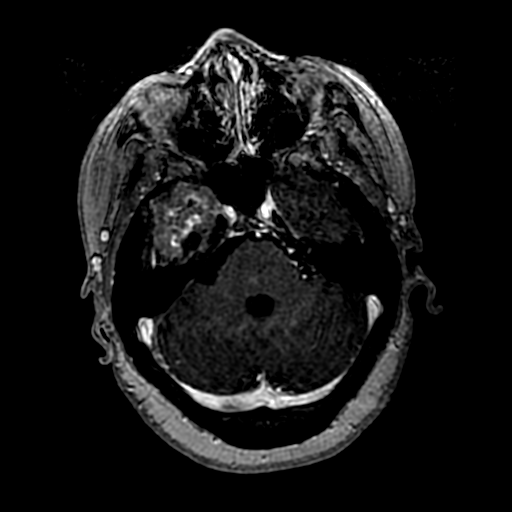
[im 51/136]
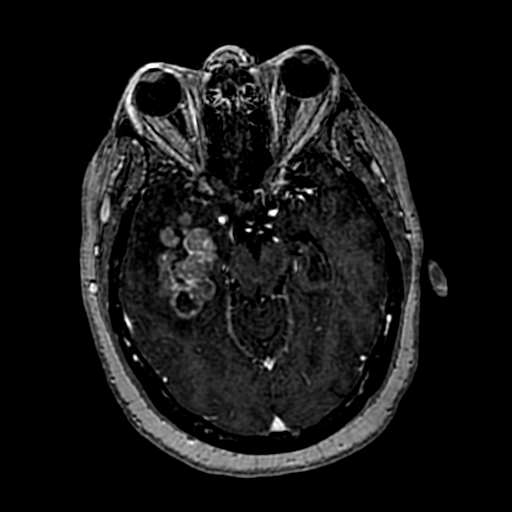
[im 85/136]
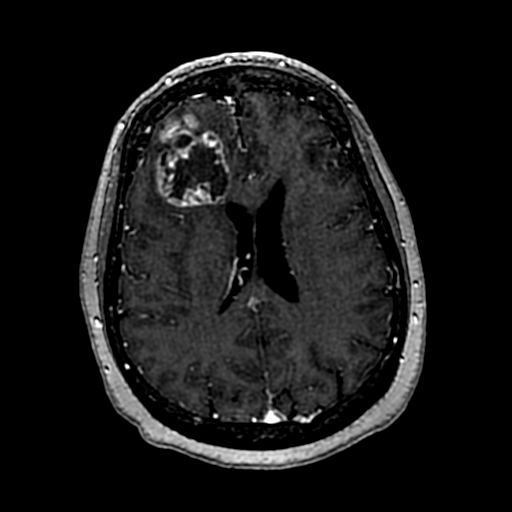
[im 102/136]
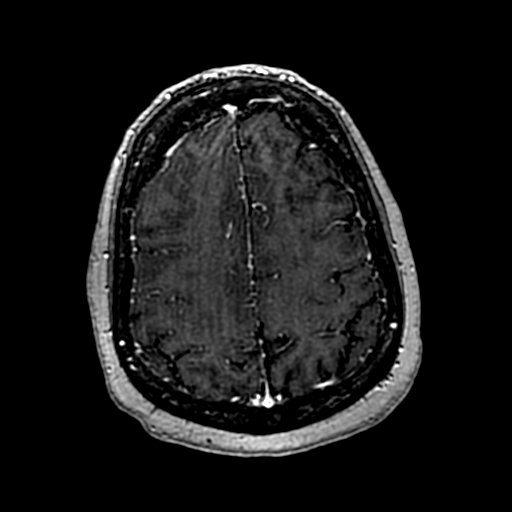
[im 119/136]
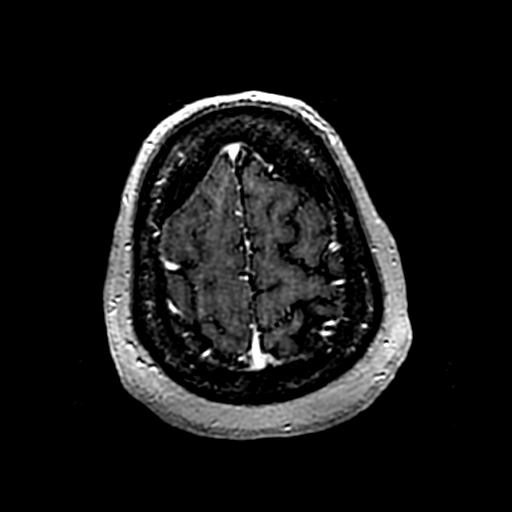
[im 136/136]
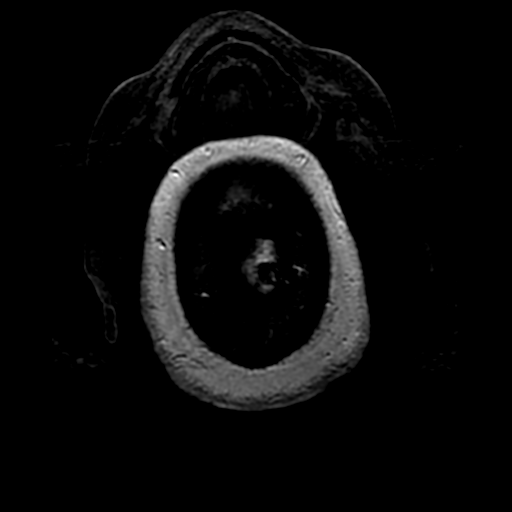

[Series 12: T1 post-contrast · coronal · 5.0mm · 0.43mm/px · 2 of 26 slices shown (2 of 3)]
[im 1/26]
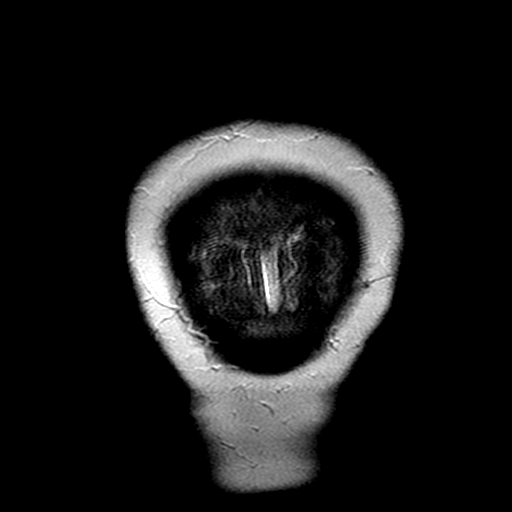
[im 26/26]
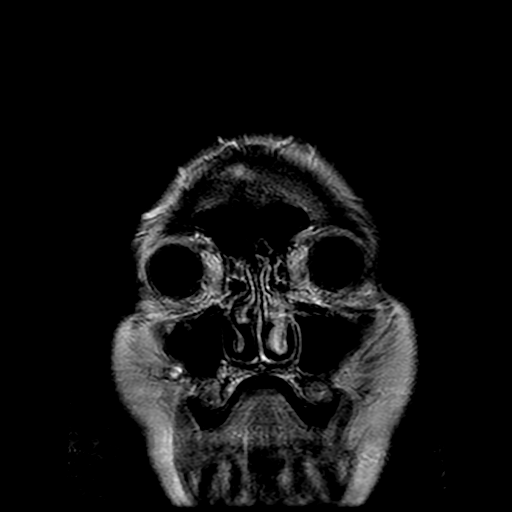

[Series 13: T1 post-contrast · sagittal · 5.0mm · 0.47mm/px · 1 of 20 slices shown (3 of 3)]
[im 1/20]
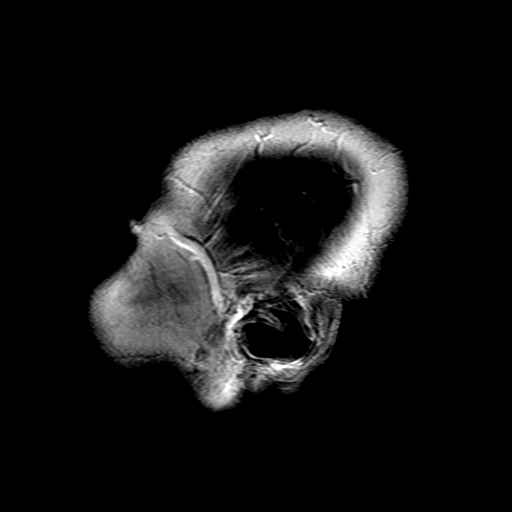

[Series 400: DWI · axial · 3.0mm · 1.09mm/px · z∈[-21,+112]mm · 3 of 46 slices shown (3 of 4)]
[im 1/46]
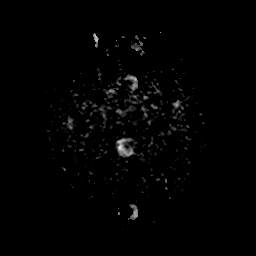
[im 23/46]
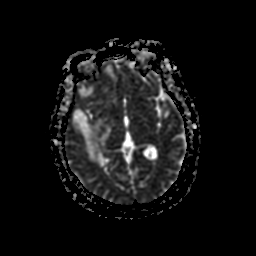
[im 46/46]
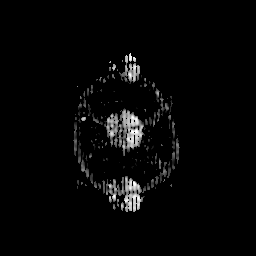

[Series 900: DWI · coronal · 4.0mm · 1.09mm/px · 3 of 41 slices shown (4 of 4)]
[im 1/41]
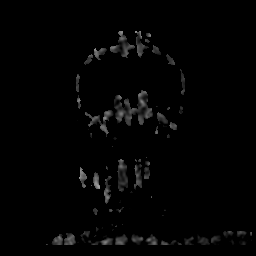
[im 21/41]
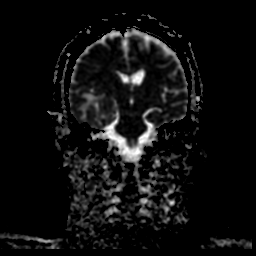
[im 41/41]
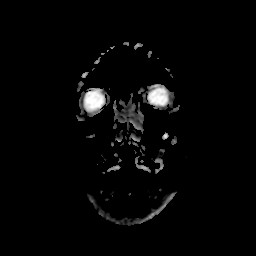

[31 of 48 positions shown; findings below may reference images not displayed]

FINDINGS: BRAIN: There are masses within the right frontal and temporal lobes.
The frontal lobe mass measures 4.6 x 3.4 cm. The temporal lobe mass
is more irregular but measures approximately 6.2 x 2.8 cm. The
temporal lobe mass has a greater solid component than the largely
necrotic frontal mass. There is moderate edema surrounding both
masses. Anteriorly, there is leftward midline shift of approximately
4 mm. Elsewhere, the white matter signal is normal for age aside
from minimal multifocal white matter hyperintensity. No
hydrocephalus. There are small amounts of blood within both masses.
There is a partially empty sella incidentally noted.

VASCULAR: The major intracranial arterial and venous sinus flow
voids are normal. Susceptibility-sensitive sequences show no chronic
microhemorrhage or superficial siderosis.

SKULL AND UPPER CERVICAL SPINE: Calvarial bone marrow signal is
normal. There is no skull base mass. The visualized upper cervical
spine and soft tissues are normal.

SINUSES/ORBITS: There are no fluid levels or advanced mucosal
thickening. The mastoid air cells and middle ear cavities are free
of fluid. The orbits are normal.
IMPRESSION: 1. Multiple right frontal and temporal lobe masses, most consistent
with metastatic disease.
2. Associated mass effect with approximately 4 mm leftward midline
shift.

This examination was performed as a routine outpatient scan. During
the study, the patient complained headache and demonstrated some
decreased alertness. She was transferred to the ER for further
assessment and is there at the time of this dictation.

## 2020-01-20 ENCOUNTER — Telehealth: Payer: Self-pay | Admitting: *Deleted

## 2020-01-20 ENCOUNTER — Telehealth: Payer: Self-pay

## 2020-01-20 NOTE — Telephone Encounter (Signed)
This RN returned call to pt's daughter, Irven [place] at [phone].  [name] states concerns due to her mother's current medical as well as mental status.  " she just has not been her self and seems more confused ".  This RN informed [name] of pt's current medical status as having known cancer that presently seems controlled- but present.  [name] inquired if her mom had an MRI recently- with this RN stating no- but she did have a CT per recent ER visit showing some residual tumor with inflammation in her brain.  This RN also reviewed recent MD visit with difficulty obtaining information from [place] about what medications she is taking.  Informed [name] that the patient is getting medications from different doctors that she may be using more then we know ( or are prescribing ).  This RN discussed with [name] - ideally need for someone to take control of pt's medications and do a pill box that allows [place] access to needed medications on a limited basis as well as a way to keep track of what and how much she is taking.  [name] is in agreement to the above- but she needs direction and teaching.  This RN informed [name] above may be best managed by having the palliative nurse come when [name] is home and review medications in the home.  Medication instructions with filling of medication box would be ideal for best outcome.  [name] is in agreement to above plan.  This RN called to [employer] and was put into VM for Melburn Popper- palliative care coordinator.  This RN left message requesting a return call from nurse to this nurse to discuss above and arranging visit while daughter is in the home.  Note phone number for [name] is [phone].

## 2020-01-20 NOTE — Telephone Encounter (Signed)
Phone call placed to patient's daughter, [name], to check in and schedule visit with Palliative care.

## 2020-01-20 NOTE — Telephone Encounter (Signed)
2nd interaction today- [name] called to state she has swollen feet - and can barely walk- she states she is out of her BP medication.  Note pt's daughter, [name], was present during call.  This RN called [facility]- and was informed the triamterene/ HCTZ and klor con were filled last week for 90 days and delivered to pt.  This RN returned call and discussed above with pt and [name].  [name] states she has no memory of above medications being delivered, where as [name] does remember 2 bottles of medications being delivered- but she does not not what medications they were.  This RN arranged a 30 day fill of the 2 missing medications which will be out of pocket cost per call to [facility] with discussion with pharmacist that the due to pt's medical status she has become very forgetful. Need to arrange with daughter [name] refills and pick or delivery so medications will not be misplaced.  This RN reviewed above with [name] who is in agreement to the above - and states she will go to [facility] to pick up medications and understands out of pocket cost ($29).  [name] will attempt to locate medications in the home.  This RN discussed with pt goal of care is to help her with her medication administration - with no plans to discontinue her current medications but have someone help her take them properly.  Per discussion [name] verbalized understanding that home nurse will be visiting to review medications with [name] and filling a weekly medication box for best outcome.  She also understands that goal too is to start the allopurinol as well as resume the BP medication to assist with bilateral feet swelling. This RN recommended too for pt to elevate feet higher then her heart to assist with dependent swelling.

## 2020-01-21 ENCOUNTER — Emergency Department (HOSPITAL_COMMUNITY): Payer: Medicare Other

## 2020-01-21 ENCOUNTER — Emergency Department (HOSPITAL_BASED_OUTPATIENT_CLINIC_OR_DEPARTMENT_OTHER)
Admission: EM | Admit: 2020-01-21 | Discharge: 2020-01-21 | Disposition: A | Discharge status: Home / Self Care | Payer: Medicare Other | Source: Home / Self Care | Attending: Emergency Medicine | Admitting: Emergency Medicine

## 2020-01-21 ENCOUNTER — Emergency Department (HOSPITAL_COMMUNITY)
Admission: EM | Admit: 2020-01-21 | Discharge: 2020-01-21 | Disposition: A | Discharge status: Home / Self Care | Payer: Medicare Other | Attending: Emergency Medicine | Admitting: Emergency Medicine

## 2020-01-21 ENCOUNTER — Telehealth: Payer: Self-pay | Admitting: *Deleted

## 2020-01-21 DIAGNOSIS — Z8616 Personal history of COVID-19: Secondary | ICD-10-CM | POA: Insufficient documentation

## 2020-01-21 DIAGNOSIS — Z79899 Other long term (current) drug therapy: Secondary | ICD-10-CM | POA: Insufficient documentation

## 2020-01-21 DIAGNOSIS — I10 Essential (primary) hypertension: Secondary | ICD-10-CM | POA: Diagnosis not present

## 2020-01-21 DIAGNOSIS — Z923 Personal history of irradiation: Secondary | ICD-10-CM | POA: Insufficient documentation

## 2020-01-21 DIAGNOSIS — M7989 Other specified soft tissue disorders: Secondary | ICD-10-CM | POA: Diagnosis not present

## 2020-01-21 DIAGNOSIS — M79601 Pain in right arm: Secondary | ICD-10-CM | POA: Diagnosis not present

## 2020-01-21 DIAGNOSIS — M79605 Pain in left leg: Secondary | ICD-10-CM | POA: Diagnosis present

## 2020-01-21 DIAGNOSIS — F17200 Nicotine dependence, unspecified, uncomplicated: Secondary | ICD-10-CM | POA: Insufficient documentation

## 2020-01-21 DIAGNOSIS — R609 Edema, unspecified: Secondary | ICD-10-CM | POA: Insufficient documentation

## 2020-01-21 DIAGNOSIS — R52 Pain, unspecified: Secondary | ICD-10-CM

## 2020-01-21 DIAGNOSIS — Z853 Personal history of malignant neoplasm of breast: Secondary | ICD-10-CM | POA: Diagnosis not present

## 2020-01-21 DIAGNOSIS — R600 Localized edema: Secondary | ICD-10-CM

## 2020-01-21 DIAGNOSIS — R2241 Localized swelling, mass and lump, right lower limb: Secondary | ICD-10-CM | POA: Insufficient documentation

## 2020-01-21 LAB — COMPREHENSIVE METABOLIC PANEL
ALT: 43 U/L (ref 0–44)
AST: 44 U/L — ABNORMAL HIGH (ref 15–41)
Albumin: 3.2 g/dL — ABNORMAL LOW (ref 3.5–5.0)
Alkaline Phosphatase: 96 U/L (ref 38–126)
Anion gap: 11 (ref 5–15)
BUN: 16 mg/dL (ref 8–23)
CO2: 25 mmol/L (ref 22–32)
Calcium: 9 mg/dL (ref 8.9–10.3)
Chloride: 99 mmol/L (ref 98–111)
Creatinine, Ser: 0.83 mg/dL (ref 0.44–1.00)
GFR calc Af Amer: >60 mL/min (ref >60)
GFR calc non Af Amer: >60 mL/min (ref >60)
Glucose, Bld: 140 mg/dL — ABNORMAL HIGH (ref 70–99)
Potassium: 4.5 mmol/L (ref 3.5–5.1)
Sodium: 135 mmol/L (ref 135–145)
Total Bilirubin: 0.6 mg/dL (ref 0.3–1.2)
Total Protein: 7 g/dL (ref 6.5–8.1)

## 2020-01-21 LAB — URINALYSIS, ROUTINE W REFLEX MICROSCOPIC
Bacteria, UA: NONE SEEN
Bilirubin Urine: NEGATIVE
Glucose, UA: NEGATIVE mg/dL
Hgb urine dipstick: NEGATIVE
Ketones, ur: NEGATIVE mg/dL
Nitrite: NEGATIVE
Protein, ur: 30 mg/dL — AB
Specific Gravity, Urine: 1.01 (ref 1.005–1.030)
pH: 8 (ref 5.0–8.0)

## 2020-01-21 LAB — CBC WITH DIFFERENTIAL/PLATELET
Abs Immature Granulocytes: 0.57 10*3/uL — ABNORMAL HIGH (ref 0.00–0.07)
Basophils Absolute: 0.1 10*3/uL (ref 0.0–0.1)
Basophils Relative: 1 %
Eosinophils Absolute: 0 10*3/uL (ref 0.0–0.5)
Eosinophils Relative: 0 %
HCT: 32.8 % — ABNORMAL LOW (ref 36.0–46.0)
Hemoglobin: 10.3 g/dL — ABNORMAL LOW (ref 12.0–15.0)
Immature Granulocytes: 5 %
Lymphocytes Relative: 4 %
Lymphs Abs: 0.5 10*3/uL — ABNORMAL LOW (ref 0.7–4.0)
MCH: 28.9 pg (ref 26.0–34.0)
MCHC: 31.4 g/dL (ref 30.0–36.0)
MCV: 91.9 fL (ref 80.0–100.0)
Monocytes Absolute: 0.3 10*3/uL (ref 0.1–1.0)
Monocytes Relative: 2 %
Neutro Abs: 9.3 10*3/uL — ABNORMAL HIGH (ref 1.7–7.7)
Neutrophils Relative %: 88 %
Platelets: 179 10*3/uL (ref 150–400)
RBC: 3.57 MIL/uL — ABNORMAL LOW (ref 3.87–5.11)
RDW: 17 % — ABNORMAL HIGH (ref 11.5–15.5)
WBC: 10.7 10*3/uL — ABNORMAL HIGH (ref 4.0–10.5)
nRBC: 0 % (ref 0.0–0.2)

## 2020-01-21 LAB — CK: Total CK: 216 U/L (ref 38–234)

## 2020-01-21 LAB — BRAIN NATRIURETIC PEPTIDE: B Natriuretic Peptide: 27.8 pg/mL (ref 0.0–100.0)

## 2020-01-21 MED ORDER — HYDROMORPHONE HCL 1 MG/ML IJ SOLN
1.0000 mg | Freq: Once | INTRAMUSCULAR | Status: AC
  Administered 2020-01-21: 1 mg via INTRAVENOUS
  Filled 2020-01-21: qty 1

## 2020-01-21 MED ORDER — FUROSEMIDE 20 MG PO TABS: 20.0000 mg | ORAL_TABLET | Freq: Every day | ORAL | 0 refills | Status: DC

## 2020-01-21 MED ORDER — FUROSEMIDE 10 MG/ML IJ SOLN
40.0000 mg | Freq: Once | INTRAMUSCULAR | Status: AC
  Administered 2020-01-21: 40 mg via INTRAVENOUS
  Filled 2020-01-21: qty 4

## 2020-01-21 MED ORDER — HEPARIN SOD (PORK) LOCK FLUSH 100 UNIT/ML IV SOLN
500.0000 [IU] | Freq: Once | INTRAVENOUS | Status: AC
  Administered 2020-01-21: 500 [IU]

## 2020-01-21 MED ORDER — KETOROLAC TROMETHAMINE 15 MG/ML IJ SOLN
15.0000 mg | Freq: Once | INTRAMUSCULAR | Status: AC
  Administered 2020-01-21: 15 mg via INTRAVENOUS
  Filled 2020-01-21: qty 1

## 2020-01-21 MED ORDER — HYDROMORPHONE HCL 1 MG/ML IJ SOLN
0.5000 mg | Freq: Once | INTRAMUSCULAR | Status: AC
  Administered 2020-01-21: 0.5 mg via INTRAVENOUS
  Filled 2020-01-21: qty 1

## 2020-01-21 MED ORDER — HEPARIN SOD (PORK) LOCK FLUSH 100 UNIT/ML IV SOLN
INTRAVENOUS | Status: AC
  Filled 2020-01-21: qty 5

## 2020-01-21 NOTE — ED Notes (Signed)
ED Provider at bedside. 

## 2020-01-21 NOTE — Progress Notes (Signed)
Bilateral lower extremity venous duplex has been completed. Preliminary results can be found in CV Proc through chart review.  Results were given to Dr. Regenia Skeeter.  01/21/20 11:00 AM Carlos Levering RVT

## 2020-01-21 NOTE — ED Provider Notes (Signed)
[facility] DEPT Provider Note   CSN: [id number] Arrival date & time: 01/21/20  [id number]     History Chief Complaint  Patient presents with  . Leg Swelling    [name] is a 64 y.o. female.  HPI 64 year old female presents with left leg pain and right arm pain.  Ongoing since yesterday.  Patient states she has never had this pain before.  Her home meds including Neurontin, oxycodone and methadone are not helping.  Denies fever, shortness of breath, chest pain.  Has had right foot swelling for about a week.  However her pain is in her left calf as well as her right arm.  No weakness or numbness but it is painful to walk.   Past Medical History:  Diagnosis Date  . Breast cancer ([place])   . DVT (deep venous thrombosis) ([place]) 10/07/2011  . Fever 02/06/2018  . Hypertension   . Radiation 11/29/2005-12/29/2005   right supraclavicular area 4147 cGy  . Radiation 06/26/02-08/14/02   right breast 5040 cGy, tumor bed boosted to 1260 cGy  . Rheumatic fever     Patient Active Problem List   Diagnosis Date Noted  . Drug-induced polyneuropathy ([place]) 12/19/2019  . Acute respiratory failure with hypoxia ([place]) 11/19/2019  . COVID-19 virus infection 11/19/2019  . Overdose opiate, accidental or unintentional, initial encounter ([place]) 11/19/2019  . Acute encephalopathy 11/19/2019  . Metastasis to brain ([place]) 09/13/2019  . Acute lower UTI 02/06/2018  . Altered mental status 02/06/2018  . Bacteremia 02/06/2018  . Polypharmacy 02/06/2018  . Goals of care, counseling/discussion 09/05/2017  . Pneumonia 01/25/2017  . CAP (community acquired pneumonia) 01/23/2017  . Port catheter in place 05/30/2016  . 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 Chronic pain 04/13/2015  . Left arm pain 08/18/2014  . Malignant neoplasm of upper-inner quadrant of right breast in female, estrogen receptor positive ([place]) 10/03/2013  . Post-lymphadenectomy lymphedema of  arm 08/15/2013  . Port or reservoir infection 01/29/2013  . DVT (deep venous thrombosis) ([place]) 10/07/2011  . Chest pain 09/10/2009  . Secondary cardiomyopathy ([place]) 09/02/2009  . Essential hypertension 02/26/2009  . GERD 02/26/2009    Past Surgical History:  Procedure Laterality Date  . BREAST SURGERY    . MASTECTOMY Bilateral   . PORT A CATH REVISION       OB History   No obstetric history on file.     Family History  Problem Relation Age of Onset  . Pancreatic cancer Mother   . Kidney cancer Sister 101  . Breast cancer Sister 76  . Breast cancer Other 69    Social History   Tobacco Use  . Smoking status: Light Tobacco Smoker  . Smokeless tobacco: Never Used  Substance Use Topics  . Alcohol use: Yes    Comment: Rarely  . Drug use: No    Home Medications Prior to Admission medications   Medication Sig Start Date End Date Taking? Authorizing Provider  cholecalciferol (VITAMIN D3) 25 MCG (1000 UNIT) tablet Take 1,000 Units by mouth daily.   Yes [provider]  allopurinol (ZYLOPRIM) 300 MG tablet Take 300 mg by mouth daily. 01/20/20   [provider]  ALPRAZolam Duanne Moron) 1 MG tablet Take 1 tablet (1 mg total) by mouth at bedtime as needed for sleep. 01/13/20   Truitt Merle, MD  Ascorbic Acid (VITAMIN C PO) Take 1 tablet by mouth daily.    [provider]  cholecalciferol (VITAMIN D) 1000 units tablet Take  1 tablet (1,000 Units total) by mouth daily. Patient not taking: Reported on 11/19/2019 05/16/18   [name], [name], MD  furosemide (LASIX) 20 MG tablet Take 1 tablet (20 mg total) by mouth daily. 01/21/20   Sherwood Gambler, MD  gabapentin (NEURONTIN) 100 MG capsule Take 100 mg by mouth 3 (three) times daily.  01/01/20   [provider]  gabapentin (NEURONTIN) 300 MG capsule Take 300 mg by mouth 2 (two) times daily.    [provider]  gabapentin (NEURONTIN) 600 MG tablet Take 600 mg by mouth 2 (two) times daily.    Sonia Side., FNP  KLOR-CON M20 20 MEQ tablet Take 20 mEq by mouth daily. 01/15/20   [provider]  magic mouthwash SOLN Take 10 mLs by mouth 4 (four) times daily as needed for mouth pain. Swish and spit.  10/18/19   [provider]  methadone (DOLOPHINE) 10 MG tablet Take 10 mg by mouth. Bottle states 1 tab 5 times a day with fill date of 01/11/2020 per Dr Mirna Mires    [provider]  Multiple Vitamin (MULTIVITAMIN) capsule Take 1 capsule by mouth daily.      [provider]  naloxone [facility]) nasal spray 4 mg/0.1 mL Administer single spray of naloxone into one nostril. Additionally, spray can be given every 2-3 minutes as needed until emergency medical assistance arrives. Call 911 immediately after use. Patient not taking: Reported on 01/15/2020 10/25/19   [name], [name], MD  omeprazole (PRILOSEC) 40 MG capsule Take 1 capsule (40 mg total) by mouth daily. 01/07/20   [name], [name], MD  ondansetron (ZOFRAN) 4 MG tablet Take 4 mg by mouth 3 (three) times daily as needed for nausea/vomiting. 01/05/20   [provider]  Oxycodone HCl 20 MG TABS Take 1 tablet (20 mg total) by mouth 3 (three) times daily as needed (pain). 01/12/20   Hayden Rasmussen, MD  polyethylene glycol (MIRALAX / GLYCOLAX) 17 g packet Take 17 g by mouth daily as needed for mild constipation. Patient not taking: Reported on 01/15/2020 11/21/19   Mercy Riding, MD  prochlorperazine (COMPAZINE) 10 MG tablet Take 1 tablet (10 mg total) by mouth every 6 (six) hours as needed for nausea or vomiting. 01/13/20   Truitt Merle, MD  senna-docusate (SENOKOT-S) 8.6-50 MG tablet Take 1 tablet by mouth 2 (two) times daily as needed for moderate constipation. Patient not taking: Reported on 01/15/2020 11/21/19   Mercy Riding, MD  sertraline (ZOLOFT) 100 MG tablet TAKE 1 TABLET BY MOUTH EVERY DAY 12/23/19   [name], [name], MD  triamterene-hydrochlorothiazide (DYAZIDE) 37.5-25 MG capsule Take 1 each (1 capsule total)  by mouth every morning. 07/30/19   [name], [name], MD  tucatinib (TUKYSA) 150 MG tablet Take 1 tablet (150 mg total) by mouth every morning. Take as directed by MD. 12/18/19   [name], [name], MD    Allergies    Tramadol, Hydrocodone-acetaminophen, and Pork-derived products  Review of Systems   Review of Systems  Respiratory: Positive for cough (chronic). Negative for shortness of breath.   Cardiovascular: Positive for leg swelling. Negative for chest pain.  Musculoskeletal: Positive for myalgias.  Neurological: Negative for weakness and numbness.  All other systems reviewed and are negative.   Physical Exam Updated Vital Signs BP 128/81   Pulse 75   Temp 98.1 F (36.7 C) (Oral)   Resp 12   SpO2 98%   Physical Exam Vitals and nursing note reviewed.  Constitutional:      General: She is not in acute distress.    Appearance: She is well-developed. She is not ill-appearing or diaphoretic.  HENT:     Head: Normocephalic and atraumatic.     Right Ear: External ear normal.     Left Ear: External ear normal.     Nose: Nose normal.  Eyes:     General:        Right eye: No discharge.        Left eye: No discharge.  Cardiovascular:     Rate and Rhythm: Normal rate and regular rhythm.     Pulses:          Dorsalis pedis pulses are 2+ on the right side and 2+ on the left side.     Heart sounds: Normal heart sounds.  Pulmonary:     Effort: Pulmonary effort is normal.     Breath sounds: Normal breath sounds.  Abdominal:     Palpations: Abdomen is soft.     Tenderness: There is no abdominal tenderness.  Musculoskeletal:     Right forearm: Tenderness present. No swelling or deformity.     Right wrist: No swelling, deformity or tenderness. Normal range of motion.     Right hand: Tenderness present. No swelling or deformity. Normal range of motion.     Right knee: Normal range of motion.     Left knee: Normal range of motion.     Right lower leg: Edema (right foot) present.      Left lower leg: Tenderness (lateral, over muscle) present. No deformity or bony tenderness. No edema.  Skin:    General: Skin is warm and dry.  Neurological:     Mental Status: She is alert.  Psychiatric:        Mood and Affect: Mood is not anxious.     ED Results / Procedures / Treatments   Labs (all labs ordered are listed, but only abnormal results are displayed) Labs Reviewed  COMPREHENSIVE METABOLIC PANEL - Abnormal; Notable for the following components:      Result Value   Glucose, Bld 140 (*)    Albumin 3.2 (*)    AST 44 (*)    All other components within normal limits  CBC WITH DIFFERENTIAL/PLATELET - Abnormal; Notable for the following components:   WBC 10.7 (*)    RBC 3.57 (*)    Hemoglobin 10.3 (*)    HCT 32.8 (*)    RDW 17.0 (*)    Neutro Abs 9.3 (*)    Lymphs Abs 0.5 (*)    Abs Immature Granulocytes 0.57 (*)    All other components within normal limits  URINALYSIS, ROUTINE W REFLEX MICROSCOPIC - Abnormal; Notable for the following components:   Protein, ur 30 (*)    Leukocytes,Ua TRACE (*)    All other components within normal limits  BRAIN NATRIURETIC PEPTIDE  CK    EKG None  Radiology DG Chest 2 View  Result Date: 01/21/2020 CLINICAL DATA:  Leg swelling. Additional history provided: Bilateral lower extremity swelling, right arm swelling, history of breast cancer, multiple falls. EXAM: CHEST - 2 VIEW COMPARISON:  Chest radiographs 01/16/2020 and earlier FINDINGS: Stable position of a left chest infusion port catheter with tip projecting in the region of the right atrium. Heart size at the upper limits of normal, unchanged. Bilateral interstitial and airspace opacities, more conspicuous as compared to prior exam 01/16/2020. No evidence of pleural effusion or pneumothorax. Thoracic spondylosis. IMPRESSION: Bilateral interstitial  and ill-defined airspace opacities, more conspicuous as compared to prior exam 01/16/2020. Findings may reflect pulmonary edema.  Infection cannot be excluded. Electronically Signed   By: Kellie Simmering DO   On: 01/21/2020 11:42   DG Forearm Right  Result Date: 01/21/2020 CLINICAL DATA:  Right arm pain and swelling EXAM: RIGHT FOREARM - 2 VIEW COMPARISON:  None. FINDINGS: There is no evidence of fracture or other focal bone lesions. No cortical destruction or periostitis. Diffuse circumferential soft tissue swelling is evident throughout the forearm. No soft tissue gas or radiopaque foreign body is seen. IMPRESSION: Diffuse circumferential soft tissue swelling throughout the forearm. No acute bony abnormality. Electronically Signed   By: Davina Poke D.O.   On: 01/21/2020 11:44   DG Tibia/Fibula Left  Result Date: 01/21/2020 CLINICAL DATA:  Left leg pain EXAM: LEFT TIBIA AND FIBULA - 2 VIEW COMPARISON:  12/29/2010 FINDINGS: There is no evidence of fracture or other focal bone lesions. No cortical destruction or periostitis. Mineralization is again noted within the soft tissues medial to the mid tibial diaphysis, unchanged from prior study 12/29/2010. Soft tissues are otherwise unremarkable. IMPRESSION: Negative. Electronically Signed   By: Davina Poke D.O.   On: 01/21/2020 11:45   DG Hand Complete Right  Result Date: 01/21/2020 CLINICAL DATA:  Swelling, history breast cancer EXAM: RIGHT HAND - COMPLETE 3+ VIEW COMPARISON:  None FINDINGS: Osseous demineralization. Scattered mild degenerative changes greatest at first [employer] joint and IP joint thumb. Scattered soft tissue swelling. Fingers superimposed on lateral view limiting assessment. No acute fracture, dislocation or bone destruction. IMPRESSION: Scattered degenerative changes and soft tissue swelling. No acute osseous abnormalities. Electronically Signed   By: Lavonia Dana [name]   On: 01/21/2020 11:42   VAS [name] LOWER EXTREMITY VENOUS (DVT) ([facility] and [facility] 7a-7p)  Result Date: 01/21/2020  Lower Venous DVTStudy Indications: Pain, and Swelling.  Risk Factors: Cancer. Limitations:  Poor ultrasound/tissue interface and patient positioning, patient movement. Comparison Study: 01/16/2020 - Negative for DVT. Performing Technologist: Oliver Hum RVT  Examination Guidelines: A complete evaluation includes B-mode imaging, spectral Doppler, color Doppler, and power Doppler as needed of all accessible portions of each vessel. Bilateral testing is considered an integral part of a complete examination. Limited examinations for reoccurring indications may be performed as noted. The reflux portion of the exam is performed with the patient in reverse Trendelenburg.  +---------+---------------+---------+-----------+----------+--------------+ RIGHT    CompressibilityPhasicitySpontaneityPropertiesThrombus Aging +---------+---------------+---------+-----------+----------+--------------+ CFV      Full           Yes      Yes                                 +---------+---------------+---------+-----------+----------+--------------+ SFJ      Full                                                        +---------+---------------+---------+-----------+----------+--------------+ FV Prox  Full                                                        +---------+---------------+---------+-----------+----------+--------------+ FV Mid   Full                                                        +---------+---------------+---------+-----------+----------+--------------+  FV DistalFull                                                        +---------+---------------+---------+-----------+----------+--------------+ PFV      Full                                                        +---------+---------------+---------+-----------+----------+--------------+ POP      Full           Yes      Yes                                 +---------+---------------+---------+-----------+----------+--------------+ PTV      Full                                                         +---------+---------------+---------+-----------+----------+--------------+ PERO     Full                                                        +---------+---------------+---------+-----------+----------+--------------+   +---------+---------------+---------+-----------+----------+--------------+ LEFT     CompressibilityPhasicitySpontaneityPropertiesThrombus Aging +---------+---------------+---------+-----------+----------+--------------+ CFV      Full           Yes      Yes                                 +---------+---------------+---------+-----------+----------+--------------+ SFJ      Full                                                        +---------+---------------+---------+-----------+----------+--------------+ FV Prox  Full                                                        +---------+---------------+---------+-----------+----------+--------------+ FV Mid   Full                                                        +---------+---------------+---------+-----------+----------+--------------+ FV DistalFull                                                        +---------+---------------+---------+-----------+----------+--------------+  PFV      Full                                                        +---------+---------------+---------+-----------+----------+--------------+ POP      Full           Yes      Yes                                 +---------+---------------+---------+-----------+----------+--------------+ PTV      Full                                                        +---------+---------------+---------+-----------+----------+--------------+ PERO     Full                                                        +---------+---------------+---------+-----------+----------+--------------+     Summary: RIGHT: - There is no evidence of deep vein thrombosis in the lower extremity.  - No cystic structure found  in the popliteal fossa.  LEFT: - There is no evidence of deep vein thrombosis in the lower extremity.  - No cystic structure found in the popliteal fossa.  *See table(s) above for measurements and observations.    Preliminary     Procedures Procedures (including critical care time)  Medications Ordered in ED Medications  heparin lock flush 100 UNIT/ML injection (has no administration in time range)  HYDROmorphone (DILAUDID) injection 1 mg (1 mg Intravenous Given 01/21/20 0911)  ketorolac (TORADOL) 15 MG/ML injection 15 mg (15 mg Intravenous Given 01/21/20 1028)  HYDROmorphone (DILAUDID) injection 0.5 mg (0.5 mg Intravenous Given 01/21/20 1028)  furosemide (LASIX) injection 40 mg (40 mg Intravenous Given 01/21/20 1231)  heparin lock flush 100 unit/mL (500 Units Intracatheter Given 01/21/20 1303)    ED Course  I have reviewed the triage vital signs and the nursing notes.  Pertinent labs & imaging results that were available during my care of the patient were reviewed by me and considered in my medical decision making (see chart for details).    MDM Rules/Calculators/A&P                      Patient's vital signs are stable here.  Given her cancer history and DVT history, DVT ultrasound was repeated in case first was false negative.  This is negative again.  Her various muscular complaints are of unclear etiology given no focal neuro deficits, focal swelling, or joint effusions.  No skin color changes.  CK is benign.  Labs are near baseline and WBC is continues to improve.  Doubt infection.  She has noted to have an atypical chest x-ray that could be edema.  She reports no shortness of breath.  She has a chronic cough.  This does not seem infectious.  Given the swelling, especially in the right leg, will give Lasix to see if this helps.  Advised her  to follow-up with PCP/oncology.  She was able to ambulate without shortness of breath or hypoxia. Final Clinical Impression(s) / ED Diagnoses Final  diagnoses:  Peripheral edema    Rx / DC Orders ED Discharge Orders         Ordered    furosemide (LASIX) 20 MG tablet  Daily     01/21/20 1246           Sherwood Gambler, MD 01/21/20 1309

## 2020-01-21 NOTE — ED Triage Notes (Signed)
History of breast cancer, transported from home by [facility] for right lower extremity swelling. Patient took Advil at 5 am with no relief. Patient also took Methadone at 5 am and 7 am. VSS excluding low blood pressure.

## 2020-01-21 NOTE — ED Notes (Signed)
1320: Spoke with darlene, who stated she would come get patient.  1350: called darlene back to ask for ETA, darlene stated that the other daughter is on the way.

## 2020-01-21 NOTE — Discharge Instructions (Signed)
If you develop fever, new or worsening pain, shortness of breath, chest pain, or any other new/concerning symptoms then return to the ER for evaluation.

## 2020-01-21 NOTE — ED Notes (Signed)
Pure wick has been placed. Suction set to 45mmHg.  

## 2020-01-21 NOTE — ED Notes (Signed)
Patient ambulated across room twice, to bathroom and back to room. Maintained o2 sats 99%-100% on room air. MD notified.

## 2020-01-22 ENCOUNTER — Telehealth: Payer: Self-pay

## 2020-01-22 ENCOUNTER — Telehealth: Payer: Self-pay | Admitting: Hospice

## 2020-01-22 ENCOUNTER — Telehealth: Payer: Self-pay | Admitting: *Deleted

## 2020-01-22 NOTE — Telephone Encounter (Signed)
This RN spoke with pt per her call stating "I need another BP medication because this one isn't working anymore "  " my feet are still swollen ".  Per discussion [name] was confused as evidenced by not knowing date and then was tangent in her discussion.  She stated she is havng pain and needs pain medication.  She states she has an appointment tomorrow on Wednesday with Dr [name].  Per inquiry about pain- and that pt has pain medication in the home- she was able to state " my daughter took my medications "  ( note per prior call to pt's daughter, [name], request per nursing was to have a pill box so patient does not have access to larger amounts of medications due to pt mis taking ).  This RN stated to [name] concern about her confusion and feet swelling.  Discussed need for MRI for further evaluation as well as having nurses from [place] visit to help with medication administration.  Plan at present is for this RN to contact [facility] for possible visit ASAP for status of pt in her home environment.  This RN will also follow up on MRI that needs to be scheduled.  This RN called [facility] and left message on VM for a return call per above.  This note will be sent as well for communication and return call to this RN.  RN return call number is [phone].

## 2020-01-22 NOTE — Telephone Encounter (Signed)
Spoke with patient's daughter,[name], to check in and schedule visit with Palliative NP. Visit scheduled for 01/29/20 @ 1pm. Will update NP and SW.

## 2020-01-22 NOTE — Telephone Encounter (Signed)
Rec'd call from patient's daughter [name], she said that she had rec'd a call from [place]. Told daughter that I would send message to Inez Catalina to let her know that she called.

## 2020-01-22 NOTE — Telephone Encounter (Signed)
Received message to call Mateo Flow RN at Dr. [name] office. Returned call to [place] who shared that patient is declining. Patient is having more confusion, swelling to both extremities. Daughter, [name], is in the home and would like to have a visit with Palliative Care NP while she is present.  Will reach out to daughter, [name].

## 2020-01-22 NOTE — Telephone Encounter (Signed)
Noted pt went to the ER today.

## 2020-01-22 NOTE — Telephone Encounter (Signed)
This RN spoke with Maxwell Caul nurse with [facility] per concerns ongoing this week and need to assist the patient's daughterNaaman Plummer with medication administration.  Primary concern is pt has become more confused- states she is " missing medications " including narcotics.  Pt has grand son in the home as well and concern for safety related to misplaced medications.  This RN called central scheduling and have MRI rescheduled to 3/26 at [facility] states she will arrange a visit with pt with [name] present and may also bring in Education officer, museum for additional resources.  This RN will await update per above.

## 2020-01-23 ENCOUNTER — Telehealth: Payer: Self-pay

## 2020-01-23 ENCOUNTER — Telehealth: Payer: Self-pay | Admitting: Emergency Medicine

## 2020-01-23 ENCOUNTER — Other Ambulatory Visit: Payer: Self-pay | Admitting: Hematology

## 2020-01-23 NOTE — Telephone Encounter (Signed)
Received call on triage line from pt reporting severe pain in hands and feet that radiates into legs bilaterally.  Pt states she cannot get ahold of her pain management physician Dr. Mirna Mires or of Dr. [name] office/desk RN Kathlee Nations today by phone.  Per pt the pain management physician wants a 'general idea of what's going on' faxed to his office at [phone].  Pt advised that RN Kathlee Nations would be made aware and would return her call at cell number listed on file shortly.  Pt verbalized understanding.  RN Kathlee Nations provided with information for f/u.

## 2020-01-23 NOTE — Telephone Encounter (Signed)
WIll forward to Dr Curlene Labrum. thanks

## 2020-01-23 NOTE — Telephone Encounter (Signed)
refill 

## 2020-01-23 NOTE — Telephone Encounter (Signed)
RN left voicemail for return call.   

## 2020-01-23 NOTE — Telephone Encounter (Signed)
RN placed call, left voicemail for return call.

## 2020-01-23 NOTE — Telephone Encounter (Signed)
RN returned call, left voicemail for return call.  

## 2020-01-23 NOTE — Telephone Encounter (Signed)
RN attempted call back, cell phone voicemail is full - left message on home phone number.

## 2020-01-23 NOTE — Telephone Encounter (Signed)
Pt requested for office notes and ED notes to be faxed to Dr. Si Gaul office at [phone].    RN faxed successfully - another attempt made to contact patient, no answer, voicemail box full.

## 2020-01-24 ENCOUNTER — Other Ambulatory Visit: Payer: Self-pay | Admitting: Oncology

## 2020-01-24 NOTE — Progress Notes (Signed)
[facility]  Telephone:(336) [phone] Fax:(336) [phone]     ID: [name]   DOB: 1956/03/29  MR#: [id number]  GGY#:[id number]  Patient Care Team: Chauncey Cruel, MD as PCP - General (Oncology) Clemma Johnsen, [name], MD as Consulting Physician (Oncology) [name], MD as Consulting Physician (Radiation Oncology) [name], [name], MD as Consulting Physician (Cardiology) [name], [name], MD as Consulting Physician (Psychiatry) [name], MD as Consulting Physician (Anesthesiology) [name], MD as Consulting Physician (Internal Medicine) [name], [name] 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  She resumed her Abraxane/trastuzumab/Pertuzumab at her last visit on 12/18/2019.  She is due for repeat dose today.  She underwent echocardiogram on 12/25/2019 showing an ejection fraction of 60-65%.  She was scheduled for brain MRI on 01/07/2020, but she contacted [name] on 12/27/2019 stating she wanted to rescheduled because she was not ready.  At that time, she also informed us she had not started the tucatinib but that she would start that day (2/19).  She presented to the ED on 01/12/2020 with headache and dizziness. Head CT without contrast was performed at that time and showed: overall improvement in vasogenic edema; no new areas concerning for additional metastatic disease; no other acute intracranial abnormality.   REVIEW OF SYSTEMS: [name] is very scattered today.  We asked her to bring all her medicines and she did.  It took my nurse approximately 1 hour to try to reconcile what she had there with the list we have in place  and some medicines were not included.  It is not clear to me exactly what [name] is taking at this point.  She does not seem to have been taking the tucatinib.  She denies unusual headaches visual changes nausea or vomiting.  She denies falls.  She tells me she has good support at home from her daughter although her daughter does work full-time.  She brought me a copy of her healthcare power of attorney and and that is separately filed.   BREAST CANCER HISTORY: From the original intake note:  The patient originally presented in May 2003 when she noticed a lump in the upper inner quadrant of her right breast in September 2003.  She sought attention and had a mammogram which showed an obvious carcinoma in the upper inner quadrant of the right breast, approximately 2 cm.  There were some enlarged lymph nodes in the axilla and an FNA done showed those consistent with malignant cells, most likely an invasive ductal carcinoma.  At that point she was unsure of what to do and was referred by Dr. [name] for a discussion of her treatment options.  By biopsy, it was ER/PR negative and HER-2 was 3+.  DNA index was 1.42.  We reiterated that it was most important to have her disease surgically addressed at that point and had recommended lumpectomy and axillary nodes to be addressed with which she followed through and on 04-03-02, Dr. [name] performed a right partial mastectomy and a right axillary lymph node dissection.  Final pathology revealed a 2.4 cm. high grade, Grade III invasive ductal carcinoma with an adjacent .8 cm. also high grade invasive ductal carcinoma which was felt to represent an intramammary metastasis  rather than a second primary.  A smaller mass was just medial to the larger mass.  The smaller mass was associated with high grade DCIS component.  There was no definite lymphovascular invasion identified.  However, one of fourteen axillary lymph nodes did contain a 1.5 cm. metastatic deposit.   Postoperatively she did very well.  We reiterated the fact that she did need adjuvant chemotherapy, however, she refused and decided that she would pursue radiation.  She received radiation and completed that on 08-14-02. Her subsequent history is as detailed below.   PAST MEDICAL HISTORY: Past Medical History:  Diagnosis Date  . Breast cancer ([place])   . DVT (deep venous thrombosis) ([place]) 10/07/2011  . Fever 02/06/2018  . Hypertension   . Radiation 11/29/2005-12/29/2005   right supraclavicular area 4147 cGy  . Radiation 06/26/02-08/14/02   right breast 5040 cGy, tumor bed boosted to 1260 cGy  . Rheumatic fever     PAST SURGICAL HISTORY: Past Surgical History:  Procedure Laterality Date  . BREAST SURGERY    . MASTECTOMY Bilateral   . PORT A CATH REVISION      FAMILY HISTORY Family History  Problem Relation Age of Onset  . Pancreatic cancer Mother   . Kidney cancer Sister 64  . Breast cancer Sister 43  . Breast cancer Other 78  The patient's father died in his 29B  from complications of alcohol abuse. The patient's mother died in her 20s from pancreatic cancer. The patient has 6 brothers, 2 sisters. Both her sisters have had breast cancer, both diagnosed after the age of 51. The patient has not had genetic testing so far.   GYNECOLOGIC HISTORY: Menarche age 68; first live birth age 11. She is GXP4. She is not sure when she stopped having periods. She never used hormone replacement.   SOCIAL HISTORY: The patient has worked in the past as a Psychologist, counselling. At home in addition to the patient are her husband [name], originally from [place], who works as a Administrator.  Also living in the home is her daughter [name] (she is studying to be a Marine scientist) and grandson.  The other 2 children are [name], who has a college degree in psychology and works as a Probation officer; and [name], who lives in [place] and works as a Higher education careers adviser. Son [name] also sometimes stays in the home. In addition the  patient has an aide who helps her almost daily.    ADVANCED DIRECTIVES: The patient has completed advanced directives and name her daughter [name] as her healthcare power of attorney.  The patient also completed a living will.  This is notarized.  These documents are being sent for scanning on epic on 01/15/2020  HEALTH MAINTENANCE: Social History   Tobacco Use  . Smoking status: Light Tobacco Smoker  . Smokeless tobacco: Never Used  Substance Use Topics  . Alcohol use: Yes    Comment: Rarely  . Drug use: No     Colonoscopy:  Not on file  PAP:  Not on file  Bone density:  Not on file  Lipid panel:  Not on file   Allergies  Allergen Reactions  . Tramadol Nausea Only  . Hydrocodone-Acetaminophen Itching and Rash  . Pork-Derived Products Other (See Comments)    Pt says she just doesn't eat pork; has no reaction to pork products    Current Outpatient Medications  Medication Sig Dispense Refill  . allopurinol (ZYLOPRIM) 300 MG tablet Take 300 mg by mouth daily.    Marland Kitchen  ALPRAZolam (XANAX) 1 MG tablet Take 1 tablet (1 mg total) by mouth at bedtime as needed for sleep. 15 tablet 0  . Ascorbic Acid (VITAMIN C PO) Take 1 tablet by mouth daily.    . cholecalciferol (VITAMIN D) 1000 units tablet Take 1 tablet (1,000 Units total) by mouth daily. (Patient not taking: Reported on 11/19/2019) 90 tablet 4  . cholecalciferol (VITAMIN D3) 25 MCG (1000 UNIT) tablet Take 1,000 Units by mouth daily.    . furosemide (LASIX) 20 MG tablet Take 1 tablet (20 mg total) by mouth daily. 7 tablet 0  . gabapentin (NEURONTIN) 100 MG capsule Take 100 mg by mouth 3 (three) times daily.     Marland Kitchen gabapentin (NEURONTIN) 300 MG capsule Take 300 mg by mouth 2 (two) times daily.    Marland Kitchen gabapentin (NEURONTIN) 600 MG tablet Take 600 mg by mouth 2 (two) times daily.    Marland Kitchen KLOR-CON M20 20 MEQ tablet Take 20 mEq by mouth daily.    . magic mouthwash SOLN Take 10 mLs by mouth 4 (four) times daily as needed  for mouth pain. Swish and spit.     . methadone (DOLOPHINE) 10 MG tablet Take 10 mg by mouth. Bottle states 1 tab 5 times a day with fill date of 01/11/2020 per Dr Mirna Mires    . Multiple Vitamin (MULTIVITAMIN) capsule Take 1 capsule by mouth daily.      . naloxone (NARCAN) nasal spray 4 mg/0.1 mL Administer single spray of naloxone into one nostril. Additionally, spray can be given every 2-3 minutes as needed until emergency medical assistance arrives. Call 911 immediately after use. (Patient not taking: Reported on 01/15/2020) 1 each 2  . omeprazole (PRILOSEC) 40 MG capsule Take 1 capsule (40 mg total) by mouth daily. 30 capsule 1  . ondansetron (ZOFRAN) 4 MG tablet TAKE 1 TABLET BY MOUTH THREE TIMES A DAY AS NEEDED 20 tablet 3  . Oxycodone HCl 20 MG TABS Take 1 tablet (20 mg total) by mouth 3 (three) times daily as needed (pain). 12 tablet 0  . polyethylene glycol (MIRALAX / GLYCOLAX) 17 g packet Take 17 g by mouth daily as needed for mild constipation. (Patient not taking: Reported on 01/15/2020) 14 each 0  . prochlorperazine (COMPAZINE) 10 MG tablet Take 1 tablet (10 mg total) by mouth every 6 (six) hours as needed for nausea or vomiting. 30 tablet 1  . senna-docusate (SENOKOT-S) 8.6-50 MG tablet Take 1 tablet by mouth 2 (two) times daily as needed for moderate constipation. (Patient not taking: Reported on 01/15/2020) 60 tablet 1  . sertraline (ZOLOFT) 100 MG tablet TAKE 1 TABLET BY MOUTH EVERY DAY 90 tablet 2  . triamterene-hydrochlorothiazide (DYAZIDE) 37.5-25 MG capsule Take 1 each (1 capsule total) by mouth every morning. 30 capsule 3  . tucatinib (TUKYSA) 150 MG tablet Take 1 tablet (150 mg total) by mouth every morning. Take as directed by MD. 60 tablet 3   No current facility-administered medications for this visit.   Facility-Administered Medications Ordered in Other Visits  Medication Dose Route Frequency Provider Last Rate Last Admin  . sodium chloride 0.9 % injection 10 mL  10 mL  Intravenous PRN Maddy Graham, [name], MD   10 mL at 03/04/14 1421  . sodium chloride flush (NS) 0.9 % injection 10 mL  10 mL Intracatheter PRN Gearlene Godsil, [name], MD   10 mL at 07/30/19 1627    OBJECTIVE: Middle-aged African-American woman   There were no vitals filed for this visit.  Wt Readings from Last 3 Encounters:  01/16/20 216 lb (98 kg)  01/15/20 216 lb (98 kg)  12/18/19 203 lb 11.2 oz (92.4 kg)   There is no height or weight on file to calculate BMI.    Sclerae unicteric, EOMs intact Wearing a mask No cervical or supraclavicular adenopathy Lungs no rales or rhonchi Heart regular rate and rhythm Abd soft, nontender, positive bowel sounds MSK no focal spinal tenderness, chronic right upper extremity lymphedema, which appears slightly decreased neuro: nonfocal, well oriented, appropriate affect Breasts: Status post bilateral mastectomy. Skin: The lesions on her back appear to be slightly improved or stable  Photo 07/04/2017     Photo 08/23/2018    LAB RESULTS:.  Lab Results  Component Value Date   WBC 10.7 (H) 01/21/2020   NEUTROABS 9.3 (H) 01/21/2020   HGB 10.3 (L) 01/21/2020   HCT 32.8 (L) 01/21/2020   MCV 91.9 01/21/2020   PLT 179 01/21/2020      Chemistry      Component Value Date/Time   NA 135 01/21/2020 0907   NA 142 11/09/2017 1214   K 4.5 01/21/2020 0907   K 3.9 11/09/2017 1214   CL 99 01/21/2020 0907   CL 101 03/07/2013 1411   CO2 25 01/21/2020 0907   CO2 32 (H) 11/09/2017 1214   BUN 16 01/21/2020 0907   BUN 19.1 11/09/2017 1214   CREATININE 0.83 01/21/2020 0907   CREATININE 0.99 01/15/2020 1020   CREATININE 1.0 11/09/2017 1214      Component Value Date/Time   CALCIUM 9.0 01/21/2020 0907   CALCIUM 9.1 11/09/2017 1214   ALKPHOS 96 01/21/2020 0907   ALKPHOS 120 11/09/2017 1214   AST 44 (H) 01/21/2020 0907   AST 25 01/15/2020 1020   AST 30 11/09/2017 1214   ALT 43 01/21/2020 0907   ALT 42 01/15/2020 1020   ALT 19 11/09/2017 1214    BILITOT 0.6 01/21/2020 0907   BILITOT 0.3 01/15/2020 1020   BILITOT <0.22 11/09/2017 1214       STUDIES: DG Chest 2 View  Result Date: 01/21/2020 CLINICAL DATA:  Leg swelling. Additional history provided: Bilateral lower extremity swelling, right arm swelling, history of breast cancer, multiple falls. EXAM: CHEST - 2 VIEW COMPARISON:  Chest radiographs 01/16/2020 and earlier FINDINGS: Stable position of a left chest infusion port catheter with tip projecting in the region of the right atrium. Heart size at the upper limits of normal, unchanged. Bilateral interstitial and airspace opacities, more conspicuous as compared to prior exam 01/16/2020. No evidence of pleural effusion or pneumothorax. Thoracic spondylosis. IMPRESSION: Bilateral interstitial and ill-defined airspace opacities, more conspicuous as compared to prior exam 01/16/2020. Findings may reflect pulmonary edema. Infection cannot be excluded. Electronically Signed   By: Kellie Simmering DO   On: 01/21/2020 11:42   DG Chest 2 View  Result Date: 01/16/2020 CLINICAL DATA:  Hypoxia and lower extremity edema EXAM: CHEST - 2 VIEW COMPARISON:  November 19, 2019. FINDINGS: Port-A-Cath tip is at the cavoatrial junction. No pneumothorax. Lungs are clear. Heart is upper normal in size with pulmonary vascularity normal. No adenopathy. There is degenerative change in the thoracic spine. IMPRESSION: Port-A-Cath tip at cavoatrial junction. Lungs clear. Cardiac silhouette within normal limits. Electronically Signed   By: Lowella Grip III [name]   On: 01/16/2020 12:51   DG Forearm Right  Result Date: 01/21/2020 CLINICAL DATA:  Right arm pain and swelling EXAM: RIGHT FOREARM - 2 VIEW COMPARISON:  None. FINDINGS: There is no  evidence of fracture or other focal bone lesions. No cortical destruction or periostitis. Diffuse circumferential soft tissue swelling is evident throughout the forearm. No soft tissue gas or radiopaque foreign body is seen. IMPRESSION:  Diffuse circumferential soft tissue swelling throughout the forearm. No acute bony abnormality. Electronically Signed   By: Davina Poke D.O.   On: 01/21/2020 11:44   DG Tibia/Fibula Left  Result Date: 01/21/2020 CLINICAL DATA:  Left leg pain EXAM: LEFT TIBIA AND FIBULA - 2 VIEW COMPARISON:  12/29/2010 FINDINGS: There is no evidence of fracture or other focal bone lesions. No cortical destruction or periostitis. Mineralization is again noted within the soft tissues medial to the mid tibial diaphysis, unchanged from prior study 12/29/2010. Soft tissues are otherwise unremarkable. IMPRESSION: Negative. Electronically Signed   By: Davina Poke D.O.   On: 01/21/2020 11:45   CT Head Wo Contrast  Result Date: 01/16/2020 CLINICAL DATA:  Altered mental status. Positive COVID-19 test last month. Breast cancer metastatic to the brain. EXAM: CT HEAD WITHOUT CONTRAST TECHNIQUE: Contiguous axial images were obtained from the base of the skull through the vertex without intravenous contrast. COMPARISON:  01/12/2020. Brain MR dated 09/04/2019. FINDINGS: Brain: Stable areas of low density in the right frontal and temporal lobes at the locations of the patient's known masses. Stable mild patchy white matter low density in both cerebral hemispheres. No intracranial hemorrhage or CT evidence of acute infarction. No mass effect. Vascular: No hyperdense vessel or unexpected calcification. Skull: Normal. Negative for fracture or focal lesion. Sinuses/Orbits: Unremarkable Other: None. IMPRESSION: 1. No acute abnormality. 2. Stable areas of low density in the right frontal and temporal lobes at the locations of the patient's known masses compatible with metastases. 3. Stable mild chronic small vessel white matter ischemic changes in both cerebral hemispheres. Electronically Signed   By: Claudie Revering [name]   On: 01/16/2020 12:33   CT Head Wo Contrast  Result Date: 01/12/2020 CLINICAL DATA:  Headache, intracranial  hemorrhage suspected EXAM: CT HEAD WITHOUT CONTRAST TECHNIQUE: Contiguous axial images were obtained from the base of the skull through the vertex without intravenous contrast. COMPARISON:  CT 11/19/2019, MR 09/04/2019 FINDINGS: Brain: Redemonstration ill-defined lesion with surrounding vasogenic edema in the right frontal lobe, the overall extent of which appears diminished from prior exam. Additional hypoattenuation in the right temporal pole compatible with a second known lesion as well. No new areas of edema. No evidence of acute infarction, hemorrhage, hydrocephalus, extra-axial collection or mass lesion/mass effect. Vascular: No hyperdense vessel or unexpected calcification. Skull: No calvarial fracture or suspicious osseous lesion. No scalp swelling or hematoma. Mild hyperostosis frontalis interna, a benign incidental finding Sinuses/Orbits: Paranasal sinuses and mastoid air cells are predominantly clear. Included orbital structures are unremarkable. Other: None IMPRESSION: 1. Ill-defined lesions in the right frontal and temporal lobes with surrounding vasogenic edema, compatible with known metastatic disease. Overall the extent of edema is diminished from prior exam. No new areas concerning for additional metastatic disease. 2. No other acute intracranial abnormality. Electronically Signed   By: Lovena Le [name]   On: 01/12/2020 20:44   DG Hand Complete Right  Result Date: 01/21/2020 CLINICAL DATA:  Swelling, history breast cancer EXAM: RIGHT HAND - COMPLETE 3+ VIEW COMPARISON:  None FINDINGS: Osseous demineralization. Scattered mild degenerative changes greatest at first [facility] joint and IP joint thumb. Scattered soft tissue swelling. Fingers superimposed on lateral view limiting assessment. No acute fracture, dislocation or bone destruction. IMPRESSION: Scattered degenerative changes and soft tissue swelling. No acute  osseous abnormalities. Electronically Signed   By: Lavonia Dana [name]   On: 01/21/2020  11:42   VAS [name] LOWER EXTREMITY VENOUS (DVT) ([facility] and [facility] 7a-7p)  Result Date: 01/21/2020  Lower Venous DVTStudy Indications: Pain, and Swelling.  Limitations: Poor ultrasound/tissue interface and patient positioning, patient movement. Comparison Study: 01/16/2020 - Negative for DVT. Performing Technologist: Oliver Hum RVT  Examination Guidelines: A complete evaluation includes B-mode imaging, spectral Doppler, color Doppler, and power Doppler as needed of all accessible portions of each vessel. Bilateral testing is considered an integral part of a complete examination. Limited examinations for reoccurring indications may be performed as noted. The reflux portion of the exam is performed with the patient in reverse Trendelenburg.  +---------+---------------+---------+-----------+----------+--------------+ RIGHT    CompressibilityPhasicitySpontaneityPropertiesThrombus Aging +---------+---------------+---------+-----------+----------+--------------+ CFV      Full           Yes      Yes                                 +---------+---------------+---------+-----------+----------+--------------+ SFJ      Full                                                        +---------+---------------+---------+-----------+----------+--------------+ FV Prox  Full                                                        +---------+---------------+---------+-----------+----------+--------------+ FV Mid   Full                                                        +---------+---------------+---------+-----------+----------+--------------+ FV DistalFull                                                        +---------+---------------+---------+-----------+----------+--------------+ PFV      Full                                                        +---------+---------------+---------+-----------+----------+--------------+ POP      Full           Yes      Yes                                  +---------+---------------+---------+-----------+----------+--------------+ PTV      Full                                                        +---------+---------------+---------+-----------+----------+--------------+  PERO     Full                                                        +---------+---------------+---------+-----------+----------+--------------+   +---------+---------------+---------+-----------+----------+--------------+ LEFT     CompressibilityPhasicitySpontaneityPropertiesThrombus Aging +---------+---------------+---------+-----------+----------+--------------+ CFV      Full           Yes      Yes                                 +---------+---------------+---------+-----------+----------+--------------+ SFJ      Full                                                        +---------+---------------+---------+-----------+----------+--------------+ FV Prox  Full                                                        +---------+---------------+---------+-----------+----------+--------------+ FV Mid   Full                                                        +---------+---------------+---------+-----------+----------+--------------+ FV DistalFull                                                        +---------+---------------+---------+-----------+----------+--------------+ PFV      Full                                                        +---------+---------------+---------+-----------+----------+--------------+ POP      Full           Yes      Yes                                 +---------+---------------+---------+-----------+----------+--------------+ PTV      Full                                                        +---------+---------------+---------+-----------+----------+--------------+ PERO     Full                                                         +---------+---------------+---------+-----------+----------+--------------+  Summary: RIGHT: - There is no evidence of deep vein thrombosis in the lower extremity.  - No cystic structure found in the popliteal fossa.  LEFT: - There is no evidence of deep vein thrombosis in the lower extremity.  - No cystic structure found in the popliteal fossa.  *See table(s) above for measurements and observations. Electronically signed by Harold Barban MD on 01/21/2020 at 9:44:28 PM.    Final    VAS [name] LOWER EXTREMITY VENOUS (DVT) (ONLY [facility])  Result Date: 01/16/2020  Lower Venous DVTStudy Indications: Swelling.  Limitations: Body habitus and poor ultrasound/tissue interface. Comparison Study: No prior exam. Performing Technologist: Baldwin Crown ARDMS, RVT  Examination Guidelines: A complete evaluation includes B-mode imaging, spectral Doppler, color Doppler, and power Doppler as needed of all accessible portions of each vessel. Bilateral testing is considered an integral part of a complete examination. Limited examinations for reoccurring indications may be performed as noted. The reflux portion of the exam is performed with the patient in reverse Trendelenburg.  +---------+---------------+---------+-----------+----------+------------------+ RIGHT    CompressibilityPhasicitySpontaneityPropertiesThrombus Aging     +---------+---------------+---------+-----------+----------+------------------+ CFV      Full           Yes      Yes                                     +---------+---------------+---------+-----------+----------+------------------+ SFJ      Full                                                            +---------+---------------+---------+-----------+----------+------------------+ FV Prox  Full                                                            +---------+---------------+---------+-----------+----------+------------------+ FV Mid   Full                                                             +---------+---------------+---------+-----------+----------+------------------+ FV DistalFull                                                            +---------+---------------+---------+-----------+----------+------------------+ PFV      Full                                                            +---------+---------------+---------+-----------+----------+------------------+ POP      Full           Yes      Yes                                     +---------+---------------+---------+-----------+----------+------------------+  PTV      Full                                         visualized with                                                          color              +---------+---------------+---------+-----------+----------+------------------+ PERO     Full                                         visualized with                                                          color              +---------+---------------+---------+-----------+----------+------------------+   +---------+---------------+---------+-----------+----------+------------------+ LEFT     CompressibilityPhasicitySpontaneityPropertiesThrombus Aging     +---------+---------------+---------+-----------+----------+------------------+ CFV      Full           Yes      Yes                                     +---------+---------------+---------+-----------+----------+------------------+ SFJ      Full                                                            +---------+---------------+---------+-----------+----------+------------------+ FV Prox  Full                                                            +---------+---------------+---------+-----------+----------+------------------+ FV Mid   Full                                                             +---------+---------------+---------+-----------+----------+------------------+ FV DistalFull                                         visualized with  color              +---------+---------------+---------+-----------+----------+------------------+ PFV      Full                                                            +---------+---------------+---------+-----------+----------+------------------+ POP      Full           Yes      Yes                                     +---------+---------------+---------+-----------+----------+------------------+ PTV      Full                                         visualized with                                                          color              +---------+---------------+---------+-----------+----------+------------------+ PERO     Full                                         visualized with                                                          color              +---------+---------------+---------+-----------+----------+------------------+     Summary: BILATERAL: - No evidence of deep vein thrombosis seen in the lower extremities, bilaterally.  RIGHT: - Portions of this examination were limited- see technologist comments above. - No cystic structure found in the popliteal fossa.  LEFT: - Portions of this examination were limited- see technologist comments above. - No cystic structure found in the popliteal fossa.  *See table(s) above for measurements and observations. Electronically signed by Monica Martinez MD on 01/16/2020 at 4:22:13 PM.    Final      ASSESSMENT:  64 y.o.  woman with stage IV breast cancer manifested chiefly by loco-regional nodal disease (neck, chest) and skin involvement, without liver, bone, or brain metastases documented   (1) Status post right upper inner quadrant lumpectomy and sentinel lymph node sampling  03/04/2002 for 2 separate foci of invasive ductal carcinoma, mpT2 pN1 or stage IIB, both foci grade 3, both estrogen and progesterone receptor positive, both HercepTest 3+, Mib-1 56%  (2) Reexcision for margins 05/27/2002 showed no residual cancer in the breast.  (3) The patient refused adjuvant systemic therapy.  (4) Adjuvant radiation treatment completed 08/14/2002.  (5) recurrence in the right breast in 02/2004 showing a morphologically different tumor, again grade 3, again estrogen and progesterone receptor negative,  with an MIB-1 of 14% and Herceptest 3+.  (5) Between 03/2004 and 07/2004 she received dose dense Doxorubicin/Cyclophosphamide x 4 given with trastuzumab, followed by weekly Docetaxel x 8, again given with trastuzumab.  (6) Right mastectomy 07/13/2004 showed scattered microscopic foci of residual disease over an area  greater than 5 cm. Margins were negative.  (7) Postoperative Docetaxel continued until 09/2004.  (8) Trastuzumab (Herceptin) given 08/2004 through 01/2012 with some brief interruptions.  RECURRENT/ STAGE IV DISEASE December 2006 (9) Isolated right cervical nodal recurrence 10/2005, treated with radiation to the right supraclavicular area (total 41.5 gray) completed 12/29/2005.  (10) Navelbine given together with Herceptin  11/2005 through 03/2006.  (9) Left mastectomy 02/13/2006 for ductal carcinoma in situ, grade 2, estrogen and progesterone receptor negative, with negative margins; 0 of 3 lymph nodes involved  (10) treated with Lapatinib and Capecitabine before 10/2009, for an unclear duration and with unclear results (cannot locate data on chart review).  (11) Status post right supraclavicular lymph node biopsy 09/2010 again positive for an invasive ductal carcinoma, estrogen and progesterone receptor negative, HER-2 positive by CISH with a ratio 4.25.  (12) Navelbine given together with Herceptin between 05/2011 and 11/2011.  (13) Carboplatin/  Gemcitabine/ Herceptin given for 2 cycles, in 12/2011 and 01/2012.  (14) TDM-1 (Kadcyla) started 02/2012. Last dose 10/02/2013 after which the patient discontinued treatment at her own discretion. Echo on December 2014 showed a well preserved ejection fraction.  (15) Deep vein thrombosis of the right upper extremity documented 04/20/2011.  She completed anticoagulant therapy with Coumadin on 03/25/2013.  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 Left chest Port-A-Cath placed on 04/08/2013; being flushed every 6 weeks  RIGHT UPPER EXTREMITY ANGIOSARCOMA VS BREAST CANCER: August 2016 (18) treated at [facility] of [place] August 2016 with paclitaxel, trastuzumab and pertuzumab 1 cycle, afterwards referred to hospice  (19) under the care of hospice of [facility] August 2016 to 05/08/2016, when the patient opted for a second try at chemotherapy  (20) started low-dose Abraxane, trastuzumab and pertuzumab 06/07/2016, to be repeated every 4 weeks.   (a) pretreatment echocardiogram 06/06/2016 showed a 60-65% ejection fraction  (b) significant compliance problems compromise response to treatment  (c)  echocardiogram 02/20/17 LVEF 55-60%  (d) Abraxane discontinued 07/04/2017 because of possible neuropathy symptoms  (e) Abraxane resumed 09/27/2017  (f) echocardiogram 07/20/2017 showed an ejection fraction of 60-65%  (g) echo 01/22/2018 showed an ejection fraction of 50-55%  (h) CT scan of the chest 05/03/2018 shows no disease involving the lungs liver or lymph nodes, with stable subcutaneous masses as previously noted  (I) Abraxane discontinued August 2019 because of concerns regarding neuropathy-- gemcitabine substituted  (j) echocardiogram 08/20/2018 shows an  ejection fraction in the 55-60% range (done every 6 months)  (k) gemcitabine discontinued because of multiple symptoms, Abraxane restarted 09/11/2018, given once every 4 weeks  (l) echocardiogram 07/17/2019 shows a well-preserved ejection fraction at 55-60%  (m) restaging PET scan 08/20/2019 shows hypermetabolic adenopathy, subcutaneous malignancy as clinically appreciated, but no liver or lung parenchymal lesions   (21) Chronic pain secondary to known metastatic disease  (a) patient signed pain contract 10/19/2016  (b) as of 11/16/2016 receives her pain medications from Dr [name]  (c)  Dr [name] no longer able to prescribe as of February 2019  (d)  all pain medications now through [facility] (as of April 2019)  (22) left breast and left axillary adenopathy noted clinically  (a) left axillary lymph node biopsy on 07/24/2019 benign  (23) BRAIN METASTASES: frontal and temporal lobe metastases noted on brain MRI 09/04/2019  (a) adjuvant radiation 09/11/2019-10/16/2019:    The brain was treated to 30 Gy in 10 fractions.   (b) tucatinib prescribed December 2020 at 150 mg daily because of methadone interaction, never started by the patient  (c) noncontrast head CT 01/12/2020 stable lesions/improved edema  (24) COVID-19 infection 2019-11-19   PLAN: [name] continues to tolerate the Abraxane and anti-HER-2 immunotherapy well and it continues to control her peripheral disease.  I reviewed with her the fact that these drugs do not cross the blood-brain barrier.  She has brain metastases.  She has had what radiation she was able or willing to undergo and she really does not need to take the tucatinib if she wants to prolong her partial brain tumor remission.  [name] was insistent on receiving pain medication and sedatives including Xanax.  These are largely being prescribed by other physicians.  We are prescribing gabapentin and methadone for her.  Again it is not clear to me exactly  what medications she is taking at present.  Today we discussed fall precautions.  I do not know how safe she is at home but she tells me that she is fine there and that she has all the help she needs at present.  We have placed a referral to palliative care through hospice.  She will return in 4 weeks for her next cycle of anti-HER-2 treatment and Abraxane.  She knows to call for any other issue that may develop before that visit.  Total encounter time 35 minutes.*  ADDENDUM: Pharmacy alerted me that there has been a significant increase in the patient's weight and wondered if we needed to adjust the trastuzumab dose.  I am leaving the dose for this today but when the patient returns in April we will consider going up to 570 depending on her weight at that time.   Dymon Summerhill, [name], MD  01/24/20 3:56 PM Medical Oncology and Hematology [facility] [place], [place] [address] Tel. [phone]    Fax. [phone]   I, Wilburn Mylar, am acting as scribe for Dr. [name]. Suraj Ramdass.  I, Lurline Del MD, have reviewed the above documentation for accuracy and completeness, and I agree with the above.   *Total Encounter Time as defined by the Centers for Medicare and Medicaid Services includes, in addition to the face-to-face time of a patient visit (documented in the note above) non-face-to-face time: obtaining and reviewing outside history, ordering and reviewing medications, tests or procedures, care coordination (communications with other health care professionals or caregivers) and documentation in the medical record.

## 2020-01-24 NOTE — Progress Notes (Signed)
I was contacted today by Dr. [name], who helps [name] manage [name] is pain medication.  He has become very concerned because [name] who had been previously very compliant now appears to not be following instructions properly, possibly overusing medication.  He is appropriately concerned because we are dealing with a stage IV cancer patient who definitely does have pain.    At the same time I can understand for [name] is coming from.  [name] calls [name] almost daily with various complaints and comments.  She is not able to give [name] a reliable account of what she is taking or when she is taking it.  This could be confusion due to prescription medications or it could be drugs of abuse, or it could be the fact that she does have metastatic disease to the brain and has had brain radiation.  We have asked her to bring all her medications here and even so we were not able to fully reconcile the list we have with the medicines that she brought.  We are working with the patient's daughter to see if she can make sure that she, the daughter, handles all of [name]'s pain medicine, knows where the medicine is (so that for example [name]'s 65-year-old grandson will not inadvertently take the patient's narcotics), and let us know if despite taking the medicine appropriately [name] still has pain.  We have also had [facility] (formerly [facility] [place]) involved through their palliative service.  We have asked them to make home visits and clarify the situation at home for [name].    I have assured [name] that I fully support his limiting [name] to his prescriptions as written.  If he gives her enough pain medicine for a month and she wishes to have an earlier refill I will support his decision not to do that unless we have very clear involvement of the family letting us know that that would be necessary.  We will also discussed the overall situation again with hospice palliative care  team.

## 2020-01-25 ENCOUNTER — Other Ambulatory Visit: Payer: Self-pay

## 2020-01-25 ENCOUNTER — Ambulatory Visit (INDEPENDENT_AMBULATORY_CARE_PROVIDER_SITE_OTHER): Payer: Medicare Other

## 2020-01-25 ENCOUNTER — Encounter (HOSPITAL_COMMUNITY): Payer: Self-pay | Admitting: *Deleted

## 2020-01-25 ENCOUNTER — Ambulatory Visit (HOSPITAL_COMMUNITY)
Admission: EM | Admit: 2020-01-25 | Discharge: 2020-01-25 | Disposition: A | Discharge status: Home / Self Care | Payer: Medicare Other | Attending: Family Medicine | Admitting: Family Medicine

## 2020-01-25 DIAGNOSIS — M79671 Pain in right foot: Secondary | ICD-10-CM | POA: Diagnosis not present

## 2020-01-25 DIAGNOSIS — M79672 Pain in left foot: Secondary | ICD-10-CM

## 2020-01-25 DIAGNOSIS — R05 Cough: Secondary | ICD-10-CM

## 2020-01-25 DIAGNOSIS — R0602 Shortness of breath: Secondary | ICD-10-CM | POA: Diagnosis not present

## 2020-01-25 MED ORDER — KETOROLAC TROMETHAMINE 30 MG/ML IJ SOLN
30.0000 mg | Freq: Once | INTRAMUSCULAR | Status: AC
  Administered 2020-01-25: 30 mg via INTRAMUSCULAR

## 2020-01-25 MED ORDER — KETOROLAC TROMETHAMINE 30 MG/ML IJ SOLN
INTRAMUSCULAR | Status: AC
  Filled 2020-01-25: qty 1

## 2020-01-25 NOTE — Discharge Instructions (Addendum)
Since you have been seen recently in the ER, and you spoken with your oncologist and pain management, I cannot prescribe you anything for pain today.  I recommend that you follow-up with him.  Go to the ER if you are having worsening symptoms, trouble swallowing, trouble breathing, other concerning symptoms.  Your chest x-ray was actually improved from your last one.

## 2020-01-25 NOTE — ED Provider Notes (Signed)
[place]    CSN: [id number] Arrival date & time: 01/25/20  1454      History   Chief Complaint Chief Complaint  Patient presents with  . Foot Pain    HPI [name] is a 64 y.o. female.   Patient reports with complaints of bilateral foot pain and swelling for weeks.  She cannot recall an exact date in which this started.  Patient reports that she has been seen in the ED for this as well as with her oncologist.  Patient has history significant for chronic opioid use, breast cancer, with brain lesions, DVT in multiple other problems per chart review.  She is stating that she needs a refill on her oxycodone, but cannot recall what it was last filled her when she last took her medication.  Patient cannot recall which medication she is on, or how she is supposed to take them.  Reports that she thinks she may have gout, and that she may need something for that.  Denies fever, headache, vomiting, nausea, diarrhea, rash, other symptoms.  The history is provided by the patient.    Past Medical History:  Diagnosis Date  . Breast cancer ([place])   . DVT (deep venous thrombosis) ([place]) 10/07/2011  . Fever 02/06/2018  . Hypertension   . Radiation 11/29/2005-12/29/2005   right supraclavicular area 4147 cGy  . Radiation 06/26/02-08/14/02   right breast 5040 cGy, tumor bed boosted to 1260 cGy  . Rheumatic fever     Patient Active Problem List   Diagnosis Date Noted  . Drug-induced polyneuropathy ([place]) 12/19/2019  . Acute respiratory failure with hypoxia ([place]) 11/19/2019  . COVID-19 virus infection 11/19/2019  . Overdose opiate, accidental or unintentional, initial encounter ([place]) 11/19/2019  . Acute encephalopathy 11/19/2019  . Metastasis to brain ([place]) 09/13/2019  . Acute lower UTI 02/06/2018  . Altered mental status 02/06/2018  . Bacteremia 02/06/2018  . Polypharmacy 02/06/2018  . Goals of care, counseling/discussion 09/05/2017  . Pneumonia 01/25/2017  . CAP (community  acquired pneumonia) 01/23/2017  . Port catheter in place 05/30/2016  . 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 Chronic pain 04/13/2015  . Left arm pain 08/18/2014  . Malignant neoplasm of upper-inner quadrant of right breast in female, estrogen receptor positive ([place]) 10/03/2013  . Post-lymphadenectomy lymphedema of arm 08/15/2013  . Port or reservoir infection 01/29/2013  . DVT (deep venous thrombosis) ([place]) 10/07/2011  . Chest pain 09/10/2009  . Secondary cardiomyopathy ([place]) 09/02/2009  . Essential hypertension 02/26/2009  . GERD 02/26/2009    Past Surgical History:  Procedure Laterality Date  . BREAST SURGERY    . MASTECTOMY Bilateral   . PORT A CATH REVISION      OB History   No obstetric history on file.      Home Medications    Prior to Admission medications   Medication Sig Start Date End Date Taking? Authorizing Provider  allopurinol (ZYLOPRIM) 300 MG tablet Take 300 mg by mouth daily. 01/20/20   [provider]  ALPRAZolam Duanne Moron) 1 MG tablet Take 1 tablet (1 mg total) by mouth at bedtime as needed for sleep. 01/13/20   Truitt Merle, MD  Ascorbic Acid (VITAMIN C PO) Take 1 tablet by mouth daily.    [provider]  cholecalciferol (VITAMIN D) 1000 units tablet Take 1 tablet (1,000 Units total) by mouth daily. Patient not taking: Reported on 11/19/2019 05/16/18   [name], [name], MD  cholecalciferol (VITAMIN D3) 25 MCG (1000  UNIT) tablet Take 1,000 Units by mouth daily.    [provider]  furosemide (LASIX) 20 MG tablet Take 1 tablet (20 mg total) by mouth daily. 01/21/20   Sherwood Gambler, MD  gabapentin (NEURONTIN) 100 MG capsule Take 100 mg by mouth 3 (three) times daily.  01/01/20   [provider]  gabapentin (NEURONTIN) 300 MG capsule Take 300 mg by mouth 2 (two) times daily.    [provider]  gabapentin (NEURONTIN) 600 MG tablet Take 600 mg by mouth 2 (two) times daily.    Sonia Side.,  FNP  KLOR-CON M20 20 MEQ tablet Take 20 mEq by mouth daily. 01/15/20   [provider]  magic mouthwash SOLN Take 10 mLs by mouth 4 (four) times daily as needed for mouth pain. Swish and spit.  10/18/19   [provider]  methadone (DOLOPHINE) 10 MG tablet Take 10 mg by mouth. Bottle states 1 tab 5 times a day with fill date of 01/11/2020 per Dr Mirna Mires    [provider]  Multiple Vitamin (MULTIVITAMIN) capsule Take 1 capsule by mouth daily.      [provider]  naloxone [facility]) nasal spray 4 mg/0.1 mL Administer single spray of naloxone into one nostril. Additionally, spray can be given every 2-3 minutes as needed until emergency medical assistance arrives. Call 911 immediately after use. Patient not taking: Reported on 01/15/2020 10/25/19   [name], [name], MD  omeprazole (PRILOSEC) 40 MG capsule Take 1 capsule (40 mg total) by mouth daily. 01/07/20   [name], [name], MD  ondansetron (ZOFRAN) 4 MG tablet TAKE 1 TABLET BY MOUTH THREE TIMES A DAY AS NEEDED 01/23/20   [name], [name], MD  Oxycodone HCl 20 MG TABS Take 1 tablet (20 mg total) by mouth 3 (three) times daily as needed (pain). 01/12/20   Hayden Rasmussen, MD  polyethylene glycol (MIRALAX / GLYCOLAX) 17 g packet Take 17 g by mouth daily as needed for mild constipation. Patient not taking: Reported on 01/15/2020 11/21/19   Mercy Riding, MD  prochlorperazine (COMPAZINE) 10 MG tablet Take 1 tablet (10 mg total) by mouth every 6 (six) hours as needed for nausea or vomiting. 01/13/20   Truitt Merle, MD  senna-docusate (SENOKOT-S) 8.6-50 MG tablet Take 1 tablet by mouth 2 (two) times daily as needed for moderate constipation. Patient not taking: Reported on 01/15/2020 11/21/19   Mercy Riding, MD  sertraline (ZOLOFT) 100 MG tablet TAKE 1 TABLET BY MOUTH EVERY DAY 12/23/19   [name], [name], MD  triamterene-hydrochlorothiazide (DYAZIDE) 37.5-25 MG capsule Take 1 each (1 capsule total) by mouth every morning.  07/30/19   [name], [name], MD  tucatinib (TUKYSA) 150 MG tablet Take 1 tablet (150 mg total) by mouth every morning. Take as directed by MD. 12/18/19   [name], [name], MD    Family History Family History  Problem Relation Age of Onset  . Pancreatic cancer Mother   . Kidney cancer Sister 78  . Breast cancer Sister 66  . Breast cancer Other 42    Social History Social History   Tobacco Use  . Smoking status: Former Research scientist (life sciences)  . Smokeless tobacco: Never Used  Substance Use Topics  . Alcohol use: Yes    Comment: Rarely  . Drug use: No     Allergies   Tramadol, Hydrocodone-acetaminophen, and Pork-derived products   Review of Systems Review of Systems  Constitutional: Negative for appetite change, chills, diaphoresis, fatigue and  fever.  HENT: Negative for dental problem, ear pain, facial swelling, hearing loss, postnasal drip, rhinorrhea, sinus pressure, sinus pain and sore throat.   Eyes: Negative for pain and visual disturbance.  Respiratory: Negative for cough and shortness of breath.   Cardiovascular: Negative for chest pain and palpitations.  Gastrointestinal: Negative for abdominal pain and vomiting.  Genitourinary: Negative for decreased urine volume, dysuria, hematuria and urgency.  Musculoskeletal: Negative for arthralgias and back pain.       Complains of bilateral foot pain and swelling  Skin: Negative for color change and rash.  Neurological: Negative for seizures and syncope.  Hematological: Negative.   Psychiatric/Behavioral: Positive for confusion and decreased concentration.  All other systems reviewed and are negative.    Physical Exam Triage Vital Signs ED Triage Vitals  Enc Vitals Group     BP 01/25/20 1603 (!) 151/104     Pulse Rate 01/25/20 1603 93     Resp 01/25/20 1603 14     Temp 01/25/20 1603 98.3 F (36.8 C)     Temp Source 01/25/20 1603 Oral     SpO2 01/25/20 1603 99 %     Weight --      Height --      Head Circumference --       Peak Flow --      Pain Score 01/25/20 1606 9     Pain Loc --      Pain Edu? --      Excl. in [place]? --    No data found.  Updated Vital Signs BP (!) 151/104   Pulse 93   Temp 98.3 F (36.8 C) (Oral)   Resp 14   SpO2 99%      Physical Exam Vitals and nursing note reviewed.  Constitutional:      General: She is not in acute distress.    Appearance: Normal appearance. She is well-developed. She is obese. She is not ill-appearing.  HENT:     Head: Normocephalic and atraumatic.     Nose: Nose normal.     Mouth/Throat:     Mouth: Mucous membranes are moist.     Pharynx: Oropharynx is clear.  Eyes:     Extraocular Movements: Extraocular movements intact.     Conjunctiva/sclera: Conjunctivae normal.     Pupils: Pupils are equal, round, and reactive to light.  Cardiovascular:     Rate and Rhythm: Normal rate and regular rhythm.     Heart sounds: No murmur.  Pulmonary:     Effort: Pulmonary effort is normal. No respiratory distress.     Breath sounds: Wheezing present.     Comments: Wheezes and decreased breath sounds in mid and lower lung lobes bilaterally. Abdominal:     General: Bowel sounds are normal. There is no distension.     Palpations: Abdomen is soft. There is no mass.     Tenderness: There is no abdominal tenderness. There is no guarding or rebound.     Hernia: No hernia is present.  Musculoskeletal:     Cervical back: Neck supple.  Skin:    General: Skin is warm and dry.  Neurological:     Mental Status: She is alert.  Psychiatric:        Attention and Perception: She is inattentive.        Behavior: Behavior is agitated.        Cognition and Memory: Memory is impaired.        Judgment: Judgment is impulsive.  UC Treatments / Results  Labs (all labs ordered are listed, but only abnormal results are displayed) Labs Reviewed - No data to display  EKG   Radiology DG Chest 2 View  Result Date: 01/25/2020 CLINICAL DATA:  Cough and shortness of  breath. History of breast cancer. Patient is unsteady on her feet. Patient has difficulty holding still. EXAM: CHEST - 2 VIEW COMPARISON:  01/21/2020 FINDINGS: Patient's LEFT-sided power port, tip to the superior vena cava. Heart is UPPER limits normal. There are patchy infiltrates in the lungs persist, but there has been some improvement in aeration accounting for differences in technique. No pleural effusions. RIGHT axillary node dissection. Patient motion artifact. IMPRESSION: Persistent but improved bilateral infiltrates. Electronically Signed   By: Nolon Nations [name]   On: 01/25/2020 16:54    Procedures Procedures (including critical care time)  Medications Ordered in UC Medications  ketorolac (TORADOL) 30 MG/ML injection 30 mg (30 mg Intramuscular Given 01/25/20 1726)    Initial Impression / Assessment and Plan / UC Course  I have reviewed the triage vital signs and the nursing notes.  Pertinent labs & imaging results that were available during my care of the patient were reviewed by me and considered in my medical decision making (see chart for details).     Presents today for bilateral foot pain and swelling for 2 weeks.  At the beginning of appointment, patient mentions that she may have gout.  Further along with appointment, patient states "I will have gout, I did not say that."  Patient's memory appears to be impaired during this appointment she seems to be impulsive and inattentive.  She is somewhat agitated, this may be because she is realizing that she is losing her train of thought often.  Patient is requesting a refill on oxycodone 20 mg.  This request is denied in this office, she received oxycodone 20 mg x 180 tablets on January 02, 2020 per PDMP record.  Per PDMP, patient is also on Xanax and methadone.  In conversation with patient, she cannot recall who gave her the medication or remember feeling it, or recall her medication regimen.  Upon further chart investigation, it was  seen that her primary care was in communication with her oncologist because she is also asking for pain medications from primary care.  They were also denied in the office, with support of the oncologist via telephone messages that were reviewed in her chart.  Instructed patient that she needs to get with primary care and oncology regarding her pain medications.  Offered patient Toradol 30 mg IM in office today, patient agreeable to receiving this medication.  Also, patient has wheezing in decreased breath sounds in the lower lobes bilaterally.  Chest x-ray today was actually improved from previous chest x-ray per chart review and per radiologist report.  No concern for new pneumonia, cardiopulmonary disease at this time.  Instructed patient that if she feels like she needs strong pain medicine, she needs to report to the emergency department for this.  Discussed with patient the urgent care is not the appropriate setting for long-term pain medication management. Final Clinical Impressions(s) / UC Diagnoses   Final diagnoses:  Foot pain, right  Foot pain, left  Shortness of breath     Discharge Instructions     Since you have been seen recently in the ER, and you spoken with your oncologist and pain management, I cannot prescribe you anything for pain today.  I recommend that you follow-up with him.  Go to the ER if you are having worsening symptoms, trouble swallowing, trouble breathing, other concerning symptoms.  Your chest x-ray was actually improved from your last one.    ED Prescriptions    None     I have reviewed the PDMP during this encounter.   Faustino Congress, NP 01/26/20 2125

## 2020-01-25 NOTE — ED Triage Notes (Signed)
Pt is cancer patient.  Denies injury.  C/O bilat foot pain and swelling x 2 wks.

## 2020-01-29 ENCOUNTER — Other Ambulatory Visit: Payer: Medicare Other | Admitting: Hospice

## 2020-01-29 ENCOUNTER — Other Ambulatory Visit: Payer: Medicare Other

## 2020-01-29 ENCOUNTER — Other Ambulatory Visit: Payer: Self-pay

## 2020-01-29 DIAGNOSIS — C50211 Malignant neoplasm of upper-inner quadrant of right female breast: Secondary | ICD-10-CM

## 2020-01-29 DIAGNOSIS — Z170 Estrogen receptor positive status [ER+]: Secondary | ICD-10-CM

## 2020-01-29 DIAGNOSIS — Z515 Encounter for palliative care: Secondary | ICD-10-CM

## 2020-01-29 NOTE — Progress Notes (Addendum)
[facility] Consult Note Telephone: [phone]  Fax: [phone]  PATIENT NAME: [name] DOB: [birth year] MRN: [id number]  PRIMARY CARE PROVIDER:   Chauncey Cruel, MD  REFERRING PROVIDER:  Chauncey Cruel, MD [place],  [place] [address]    REFERRING PROVIDER:  [name] RESPONSIBLE PARTY:   Self Contacts:  [name] Vandiver [phone]   RECOMMENDATIONS/PLAN:   [place] Planning/Goals of Care:  Joint visit with SW [facility] and patient/family consisted of building trust and discussions on Palliative Medicine as specialized medical care for people living with serious illness, aimed at facilitating better quality of life through symptoms relief, assisting with advance care plan and establishing goals of care. Patient, her two daughters [name] and [name], and [place] were present. Patient verbalized her desire to live long but jointly agreed with her children that she now wants to be a DNR. DNR form signed today and left with POA [name]. Copy of DNR uploaded to Epic. Patient is close to her grandson Clarene Essex and worried for how he is dealing with her declining health. Therapeutic listening and emotional support provided. Monica to put in [facility] for Clarene Essex who is 65 years old. Monica to also liaise with [place] on a visit to further discuss death and dying, per family request. Goals of care include to maximize quality of life and symptom management. Family open to hospice services in the future. MOST form discussion initiated today and to be continued next visit.  Symptom management: Chronic pain related to Stage IV estrogen receptor negative Breast Cancer, angiosarcoma well managed with Methadone, Oxycodone, Gabapentin. Naloxone at home. She goes to a pain clinic. Her daughters have taken over management of her medications because of patient's confusion since her CA mets to  her brain. She denied pain/discomfort during visit; just recovered from gout pain managed with allopurinol. Edema to BLE resolved with Furosemide.   Patient not taking Laury [name] as ordered for brain metastases, despite education. Radiation was completed. Next chemo treatment on 02/12/2020. [name] reported patient sometimes withconfusion, incontinence and tremors in both hands. No tremors during visit. PPS declined from 60% last visit to 50%. Nursing aid from [place] comes in 2 hours a day, 7 days a week. Patient now uses a rolling walker/cane for support r/t unsteady gait. Education provided on Falls precautions. Encouraged ongoing care and to call with concerns.  Follow up: Palliative care will continue to follow patient for goals of care clarification and symptom management. I spent 76 minutes providing this consultation from 1pm to 2.16pm; time includes chart review and documentation. More than 50% of the time in this consultation was spent on coordinating communication  HISTORY OF PRESENT ILLNESS:  [name] is a 64 y.o. year old female with multiple medical problems includingStage IV estrogen receptor negative Breast Cancer, with brain mets, angiosarcoma, Gout. Palliative Care was asked to help address goals of care.    CODE STATUS: DNR  PPS: 50% HOSPICE ELIGIBILITY/DIAGNOSIS: TBD  PAST MEDICAL HISTORY:  Past Medical History:  Diagnosis Date  . Breast cancer ([place])   . DVT (deep venous thrombosis) ([place]) 10/07/2011  . Fever 02/06/2018  . Hypertension   . Radiation 11/29/2005-12/29/2005   right supraclavicular area 4147 cGy  . Radiation 06/26/02-08/14/02   right breast 5040 cGy, tumor bed boosted to 1260 cGy  . Rheumatic fever     SOCIAL HX:  Social History   Tobacco Use  .  Smoking status: Former Research scientist (life sciences)  . Smokeless tobacco: Never Used  Substance Use Topics  . Alcohol use: Yes    Comment: Rarely    ALLERGIES:  Allergies  Allergen Reactions  . Tramadol Nausea Only  .  Hydrocodone-Acetaminophen Itching and Rash  . Pork-Derived Products Other (See Comments)    Pt says she just doesn't eat pork; has no reaction to pork products     PERTINENT MEDICATIONS:  Outpatient Encounter Medications as of 01/29/2020  Medication Sig  . allopurinol (ZYLOPRIM) 300 MG tablet Take 300 mg by mouth daily.  Marland Kitchen ALPRAZolam (XANAX) 1 MG tablet Take 1 tablet (1 mg total) by mouth at bedtime as needed for sleep.  . Ascorbic Acid (VITAMIN C PO) Take 1 tablet by mouth daily.  . cholecalciferol (VITAMIN D) 1000 units tablet Take 1 tablet (1,000 Units total) by mouth daily. (Patient not taking: Reported on 11/19/2019)  . cholecalciferol (VITAMIN D3) 25 MCG (1000 UNIT) tablet Take 1,000 Units by mouth daily.  . furosemide (LASIX) 20 MG tablet Take 1 tablet (20 mg total) by mouth daily.  Marland Kitchen gabapentin (NEURONTIN) 100 MG capsule Take 100 mg by mouth 3 (three) times daily.   Marland Kitchen gabapentin (NEURONTIN) 300 MG capsule Take 300 mg by mouth 2 (two) times daily.  Marland Kitchen gabapentin (NEURONTIN) 600 MG tablet Take 600 mg by mouth 2 (two) times daily.  Marland Kitchen KLOR-CON M20 20 MEQ tablet Take 20 mEq by mouth daily.  . magic mouthwash SOLN Take 10 mLs by mouth 4 (four) times daily as needed for mouth pain. Swish and spit.   . methadone (DOLOPHINE) 10 MG tablet Take 10 mg by mouth. Bottle states 1 tab 5 times a day with fill date of 01/11/2020 per Dr Mirna Mires  . Multiple Vitamin (MULTIVITAMIN) capsule Take 1 capsule by mouth daily.    . naloxone (NARCAN) nasal spray 4 mg/0.1 mL Administer single spray of naloxone into one nostril. Additionally, spray can be given every 2-3 minutes as needed until emergency medical assistance arrives. Call 911 immediately after use. (Patient not taking: Reported on 01/15/2020)  . omeprazole (PRILOSEC) 40 MG capsule Take 1 capsule (40 mg total) by mouth daily.  . ondansetron (ZOFRAN) 4 MG tablet TAKE 1 TABLET BY MOUTH THREE TIMES A DAY AS NEEDED  . Oxycodone HCl 20 MG TABS Take 1 tablet (20 mg  total) by mouth 3 (three) times daily as needed (pain).  . polyethylene glycol (MIRALAX / GLYCOLAX) 17 g packet Take 17 g by mouth daily as needed for mild constipation. (Patient not taking: Reported on 01/15/2020)  . prochlorperazine (COMPAZINE) 10 MG tablet Take 1 tablet (10 mg total) by mouth every 6 (six) hours as needed for nausea or vomiting.  . senna-docusate (SENOKOT-S) 8.6-50 MG tablet Take 1 tablet by mouth 2 (two) times daily as needed for moderate constipation. (Patient not taking: Reported on 01/15/2020)  . sertraline (ZOLOFT) 100 MG tablet TAKE 1 TABLET BY MOUTH EVERY DAY  . triamterene-hydrochlorothiazide (DYAZIDE) 37.5-25 MG capsule Take 1 each (1 capsule total) by mouth every morning.  . tucatinib (TUKYSA) 150 MG tablet Take 1 tablet (150 mg total) by mouth every morning. Take as directed by MD.   Facility-Administered Encounter Medications as of 01/29/2020  Medication  . sodium chloride 0.9 % injection 10 mL  . sodium chloride flush (NS) 0.9 % injection 10 mL   PHYSICAL EXAM/ROS:   General:cooperative, in no acute distress Cardiovascular:denies chest pain Pulmonary: normal respiratory effort; breathes heavily at baseline. Extremities: no edema,  no joint deformities Skin: no rashes to exposed skin Neurological: Confusion, Weakness but otherwise nonfocal  Teodoro Spray, NP

## 2020-01-30 NOTE — Progress Notes (Signed)
Visit completed with nurse practitioner

## 2020-01-31 ENCOUNTER — Other Ambulatory Visit: Payer: Self-pay | Admitting: *Deleted

## 2020-01-31 ENCOUNTER — Other Ambulatory Visit: Payer: Self-pay

## 2020-01-31 ENCOUNTER — Ambulatory Visit (HOSPITAL_COMMUNITY)
Admission: RE | Admit: 2020-01-31 | Discharge: 2020-01-31 | Disposition: A | Discharge status: Home / Self Care | Payer: Medicare Other | Source: Ambulatory Visit | Attending: Oncology | Admitting: Oncology

## 2020-01-31 DIAGNOSIS — C50211 Malignant neoplasm of upper-inner quadrant of right female breast: Secondary | ICD-10-CM | POA: Insufficient documentation

## 2020-01-31 DIAGNOSIS — Z170 Estrogen receptor positive status [ER+]: Secondary | ICD-10-CM | POA: Insufficient documentation

## 2020-01-31 DIAGNOSIS — C4911 Malignant neoplasm of connective and soft tissue of right upper limb, including shoulder: Secondary | ICD-10-CM | POA: Diagnosis present

## 2020-01-31 DIAGNOSIS — C7931 Secondary malignant neoplasm of brain: Secondary | ICD-10-CM | POA: Diagnosis present

## 2020-01-31 MED ORDER — GADOBUTROL 1 MMOL/ML IV SOLN
7.5000 mL | Freq: Once | INTRAVENOUS | Status: AC | PRN
  Administered 2020-01-31: 7.5 mL via INTRAVENOUS

## 2020-01-31 MED ORDER — HEPARIN SOD (PORK) LOCK FLUSH 100 UNIT/ML IV SOLN
INTRAVENOUS | Status: AC
  Filled 2020-01-31: qty 5

## 2020-01-31 NOTE — Progress Notes (Signed)
COMMUNITY PALLIATIVE CARE SW NOTE  PATIENT NAME: [name] DOB: [birth year] MRN: [id number]  PRIMARY CARE PROVIDER: Chauncey Cruel, MD  RESPONSIBLE PARTY:  Acct ID - Guarantor Home Phone Work Phone Relationship Acct Type  [id number] [name] [phone]  Self P/F     [place], [place] and INTERVENTIONS:             O,  [id number]     1. GOALS OF CARE/ ADVANCE CARE PLANNING:  Patient is a DNR, form in the home. HCPOA is daughter-[name]. Goal is for patient to remain in her home, as independent as possible. 2. SOCIAL/EMOTIONAL/SPIRITUAL ASSESSMENT/ INTERVENTIONS:  SW met with patient and her two daughters-[name] and [name] in the home. Patient reported pain to her chest that she described as arthritis. She also reported pain to both feet as result of gout-no swelling noted. Patient has a persistent, unproductive cough. Her daughters described noted changes in patient's condition in the last two weeks: increased confusion, tremors in both hands, incontinence, increased weakness where she is having difficulty getting up to ambulate on her own. Both daughters are encouraging patient to use her rolling walker or cane. Patient medications are locked and administered by her daughters which has been helpful in the medication management and compliance for patient. Patient appetite remains good. Education and clarity provided on patient diagnosis, affirmed that changes in cognition and physical functions are a part of disease progression. Supportive counseling, normalization, active listening and reflective listening. Patient strongly encouraged to use assistive devices to ambulate and safety to prevent falls discussed. Patient continues to receive personal care services 7x/week for two hours a day through [employer].  3. PATIENT/CAREGIVER EDUCATION/ COPING: Patient is alert and oriented to self, place and situation. She repeated herself several times and deferred to her  daughters to answer questions as she had difficulty finding words. Both daughters expressed need to process patient decline as they have difficulty understanding what is being relayed to them by PCP. Patient has a 65 year old grandson in the home that is becoming more aware of her illness, particularly cognitive changes and counseling was requested for him. SW provided education on [facility] services and [name] (childs mother) requested a referral be made. Both daughters seem realistic about the progression of patient disease. 4. PERSONAL EMERGENCY PLAN: Patient/family will call 911 for emergencies 5. COMMUNITY RESOURCES COORDINATION/ HEALTH CARE NAVIGATION: SW will make a [facility] referral; Palliative Care Nurse Practitioner will be updated for follow-up. 6. FINANCIAL/LEGAL CONCERNS/INTERVENTIONS: None.     SOCIAL HX:  Social History   Tobacco Use  . Smoking status: Former Research scientist (life sciences)  . Smokeless tobacco: Never Used  Substance Use Topics  . Alcohol use: Yes    Comment: Rarely    CODE STATUS: DNR ADVANCED DIRECTIVES: Y MOST FORM COMPLETE: NO HOSPICE EDUCATION PROVIDED: YES  PPS: Patient is ambulatory but gait is unsteady and is needing assistance from her daughters. She is holding on to the walls and furniture. The family is concern about patient falling. She has a rolling walker and cane that she has to be reminded to use. She is having increased incontinence episodes.  I spent 60 minutes with patient/family from 1:00p-2:00pm, providing education and supportive counseling as it related to patient/family established goals, disease process, perceived and assessed needs.      [address] [place], [place]

## 2020-01-31 NOTE — Telephone Encounter (Signed)
Pt called to this RN to state her pharmacy " [facility] is trying to get a hold of you about refilling my gabapentin "  This RN inquired with pt above may not be appropriate for refill depending on when last fill was.  Pt stated " oh-it's time "  She also stated " could you get me some methadone too"  This RN informed pt methadone is not being filled by [name] with pt stating " yes- the 5 mg tablets "  This RN informed pt she now has 10 mg tablets being filled by her pain management MD and at previous appointment at this office she had a full bottle.  Florestine states " I used all of them because of the gout "  This RN informed pt medications cannot be refilled early unless discussed with prescribing MD per new laws regarding narcotics.  Call ended with this RN verifying pt's understanding of MRI this afternoon at [facility] and that her medication will have to further reviewed for appropriate refills.  This RN contacted [employer] and was put in a VM - message left to return call to this RN.  This RN made second call to [facility] and spoke with pharmacist.  She states pt had gabapentin 100mg  on 01/27/2020.  The methadone 5mg  was dispensed on 01/02/2020 and The methadone 10mg  was dispensed on 01/11/2020 with dispense amount of 150 - sig states 1 tab 5 times daily = 1 month supply.  No refills can be given at this time due to not time for refills.

## 2020-02-03 ENCOUNTER — Encounter: Payer: Self-pay | Admitting: Oncology

## 2020-02-03 ENCOUNTER — Other Ambulatory Visit: Payer: Self-pay

## 2020-02-03 ENCOUNTER — Telehealth: Payer: Self-pay | Admitting: Oncology

## 2020-02-03 ENCOUNTER — Inpatient Hospital Stay (HOSPITAL_BASED_OUTPATIENT_CLINIC_OR_DEPARTMENT_OTHER): Payer: Medicare Other | Admitting: Oncology

## 2020-02-03 ENCOUNTER — Telehealth: Payer: Self-pay | Admitting: *Deleted

## 2020-02-03 ENCOUNTER — Other Ambulatory Visit: Payer: Self-pay | Admitting: Oncology

## 2020-02-03 VITALS — BP 138/76 | HR 105 | Temp 98.7°F | Resp 17 | Ht 66.0 in | Wt 197.7 lb

## 2020-02-03 DIAGNOSIS — Z170 Estrogen receptor positive status [ER+]: Secondary | ICD-10-CM | POA: Diagnosis not present

## 2020-02-03 DIAGNOSIS — C50211 Malignant neoplasm of upper-inner quadrant of right female breast: Secondary | ICD-10-CM

## 2020-02-03 DIAGNOSIS — C7931 Secondary malignant neoplasm of brain: Secondary | ICD-10-CM

## 2020-02-03 DIAGNOSIS — C4911 Malignant neoplasm of connective and soft tissue of right upper limb, including shoulder: Secondary | ICD-10-CM | POA: Diagnosis not present

## 2020-02-03 DIAGNOSIS — Z5112 Encounter for antineoplastic immunotherapy: Secondary | ICD-10-CM | POA: Diagnosis not present

## 2020-02-03 NOTE — Telephone Encounter (Signed)
This RN spoke with pt's daughter, [name], per her call stating concern with pt due to overall decline in status " she isn't doing well and says she isn't doing well "  [name] states mom does not state exactly why she feels so bad.  Per above - as well as pt 's recent MRI of brain - appointment made for 2 pm today with request for [name] to accompany pt for best communication and plan.

## 2020-02-03 NOTE — Telephone Encounter (Signed)
Rescheduled appt per 3/29 los. Pt daughter confirmed new appt times.

## 2020-02-03 NOTE — Progress Notes (Signed)
[place]  [phone] [phone]) [phone]     ID: [name]   DOB: [birth year]  MR#: [id number]  BEE#:[id number]  Patient Care Team: Chauncey Cruel, MD as PCP - General (Oncology) Devante Capano, [name], MD as Consulting Physician (Oncology) [name], MD as Consulting Physician (Radiation Oncology) [name], [name], MD as Consulting Physician (Cardiology) [name], [name], MD as Consulting Physician (Psychiatry) [name], MD as Consulting Physician (Anesthesiology) [name], MD as Consulting Physician (Internal Medicine) [name], [name] 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 She is accompanied by her daughter, [name], who is the patient's healthcare power of attorney and who contacted [name] this morning with concerns for her mother's overall decline in status.  She continues on Abraxane/trastuzumab/Pertuzumab.  Her most recent echocardiogram on 12/25/2019 showed an ejection fraction of 60-65%.  Since her last visit, she underwent brain MRI on 01/31/2020 showing: decreased size of right frontal and right temporal lobe metastases; mild residual right temporal edema without significant mass effect; no evidence of new intracranial metastases or acute abnormality.  She also presented to the ED on 01/16/2020 with bilateral extremity swelling and foot pain. She reported the foot pain was causing issues with her sleep, and she fell asleep several times in the ED. Head CT was stable, and chest x-ray was normal. Lower extremity doppler was negative for DVT.  She presented back to the ED on 01/21/2020 with left leg  and right arm pain, as well as right foot swelling. Chest x-ray showed: more conspicuous bilateral interstitial and ill-defined airspace opacities. Right hand and forearm x-rays showed soft tissue swelling with no acute bony abnormalities. Repeat lower extremity ultrasound was again negative for DVT. She was, however, started on Lasix at that time.   REVIEW OF SYSTEMS: [name]'s situation has been chaotic recently as she has run out of her pain medication early for what ever reason.  We were contacted by Dr. Mirna Mires her pain doctor and he explained that he is simply not able to reorder medication if she runs out early without consulting with him because he will lose his license.  Similarly she is supposed to get all her pain medication from one physician and one pharmacy.  It is unclear whether she has been taking the medicine inappropriately, losing the medication, or whether diversion is taking place.  As a result of this approximately a week ago her daughter [name] has taken the patient's medications in hand.  Szymon who is here today does not live with them but she is the one who feels the medication box on Sundays she tells me.  It appears that things are now back to baseline, [name] is getting the pain medication on schedule and taking it as prescribed.  Other concerns include leg swelling issues and of course results of the recent scan which are discussed below    BREAST CANCER HISTORY: From the original intake note:  The patient originally presented in May 2003 when she noticed a lump in the upper inner quadrant of her right breast in September 2003.  She sought attention and had a mammogram which showed an obvious carcinoma in the upper inner quadrant of the right breast, approximately 2 cm.  There  were some enlarged lymph nodes in the axilla and an FNA done showed those consistent with malignant cells, most likely an invasive ductal carcinoma.  At that point she was unsure of what to do and  was referred by Dr. [name] for a discussion of her treatment options.  By biopsy, it was ER/PR negative and HER-2 was 3+.  DNA index was 1.42.  We reiterated that it was most important to have her disease surgically addressed at that point and had recommended lumpectomy and axillary nodes to be addressed with which she followed through and on 04-03-02, Dr. [name] performed a right partial mastectomy and a right axillary lymph node dissection.  Final pathology revealed a 2.4 cm. high grade, Grade III invasive ductal carcinoma with an adjacent .8 cm. also high grade invasive ductal carcinoma which was felt to represent an intramammary metastasis rather than a second primary.  A smaller mass was just medial to the larger mass.  The smaller mass was associated with high grade DCIS component.  There was no definite lymphovascular invasion identified.  However, one of fourteen axillary lymph nodes did contain a 1.5 cm. metastatic deposit.  Postoperatively she did very well.  We reiterated the fact that she did need adjuvant chemotherapy, however, she refused and decided that she would pursue radiation.  She received radiation and completed that on 08-14-02. Her subsequent history is as detailed below.   PAST MEDICAL HISTORY: Past Medical History:  Diagnosis Date  . Breast cancer ([place])   . DVT (deep venous thrombosis) ([place]) 10/07/2011  . Fever 02/06/2018  . Hypertension   . Radiation 11/29/2005-12/29/2005   right supraclavicular area 4147 cGy  . Radiation 06/26/02-08/14/02   right breast 5040 cGy, tumor bed boosted to 1260 cGy  . Rheumatic fever     PAST SURGICAL HISTORY: Past Surgical History:  Procedure Laterality Date  . BREAST SURGERY    . MASTECTOMY Bilateral   . PORT A CATH REVISION      FAMILY HISTORY Family History  Problem Relation Age of Onset  . Pancreatic cancer Mother   . Kidney cancer Sister 51  . Breast cancer Sister 13  . Breast cancer Other 81  The patient's father died in  his 62M  from complications of alcohol abuse. The patient's mother died in her 48s from pancreatic cancer. The patient has 6 brothers, 2 sisters. Both her sisters have had breast cancer, both diagnosed after the age of 55. The patient has not had genetic testing so far.   GYNECOLOGIC HISTORY: Menarche age 14; first live birth age 38. She is GXP4. She is not sure when she stopped having periods. She never used hormone replacement.   SOCIAL HISTORY: The patient has worked in the past as a Psychologist, counselling. At home in addition to the patient are her husband [name], originally from [place], who works as a Administrator.  The other children are [name], who lives with the patient and has a college degree in psychology; she works as a Probation officer; no longer living in the home is her daughter [name] (she is studying to be a Marine scientist) and grandson.son [name], lives in [place] and works as a Higher education careers adviser. Son [name] also sometimes stays in the home. In addition the patient has an aide who helps her almost daily.    ADVANCED DIRECTIVES: The patient has completed advanced directives and name her daughter [name] as her healthcare power of attorney.  The patient also completed a living will.  This  is notarized.  These documents are being sent for scanning on epic on 01/15/2020  HEALTH MAINTENANCE: Social History   Tobacco Use  . Smoking status: Former Research scientist (life sciences)  . Smokeless tobacco: Never Used  Substance Use Topics  . Alcohol use: Yes    Comment: Rarely  . Drug use: No     Colonoscopy:  Not on file  PAP:  Not on file  Bone density:  Not on file  Lipid panel:  Not on file   Allergies  Allergen Reactions  . Tramadol Nausea Only  . Hydrocodone-Acetaminophen Itching and Rash  . Pork-Derived Products Other (See Comments)    Pt says she just doesn't eat pork; has no reaction to pork products    Current Outpatient Medications  Medication Sig Dispense Refill  . allopurinol  (ZYLOPRIM) 300 MG tablet Take 300 mg by mouth daily.    Marland Kitchen ALPRAZolam (XANAX) 1 MG tablet Take 1 tablet (1 mg total) by mouth at bedtime as needed for sleep. 15 tablet 0  . Ascorbic Acid (VITAMIN C PO) Take 1 tablet by mouth daily.    . cholecalciferol (VITAMIN D) 1000 units tablet Take 1 tablet (1,000 Units total) by mouth daily. (Patient not taking: Reported on 11/19/2019) 90 tablet 4  . cholecalciferol (VITAMIN D3) 25 MCG (1000 UNIT) tablet Take 1,000 Units by mouth daily.    . furosemide (LASIX) 20 MG tablet Take 1 tablet (20 mg total) by mouth daily. 7 tablet 0  . gabapentin (NEURONTIN) 100 MG capsule Take 100 mg by mouth 3 (three) times daily.     Marland Kitchen gabapentin (NEURONTIN) 300 MG capsule Take 300 mg by mouth 2 (two) times daily.    Marland Kitchen gabapentin (NEURONTIN) 600 MG tablet Take 600 mg by mouth 2 (two) times daily.    Marland Kitchen KLOR-CON M20 20 MEQ tablet Take 20 mEq by mouth daily.    . magic mouthwash SOLN Take 10 mLs by mouth 4 (four) times daily as needed for mouth pain. Swish and spit.     . methadone (DOLOPHINE) 10 MG tablet Take 10 mg by mouth. Bottle states 1 tab 5 times a day with fill date of 01/11/2020 per Dr Mirna Mires    . Multiple Vitamin (MULTIVITAMIN) capsule Take 1 capsule by mouth daily.      . naloxone (NARCAN) nasal spray 4 mg/0.1 mL Administer single spray of naloxone into one nostril. Additionally, spray can be given every 2-3 minutes as needed until emergency medical assistance arrives. Call 911 immediately after use. (Patient not taking: Reported on 01/15/2020) 1 each 2  . omeprazole (PRILOSEC) 40 MG capsule Take 1 capsule (40 mg total) by mouth daily. 30 capsule 1  . ondansetron (ZOFRAN) 4 MG tablet TAKE 1 TABLET BY MOUTH THREE TIMES A DAY AS NEEDED 20 tablet 3  . Oxycodone HCl 20 MG TABS Take 1 tablet (20 mg total) by mouth 3 (three) times daily as needed (pain). 12 tablet 0  . polyethylene glycol (MIRALAX / GLYCOLAX) 17 g packet Take 17 g by mouth daily as needed for mild constipation.  (Patient not taking: Reported on 01/15/2020) 14 each 0  . prochlorperazine (COMPAZINE) 10 MG tablet Take 1 tablet (10 mg total) by mouth every 6 (six) hours as needed for nausea or vomiting. 30 tablet 1  . senna-docusate (SENOKOT-S) 8.6-50 MG tablet Take 1 tablet by mouth 2 (two) times daily as needed for moderate constipation. (Patient not taking: Reported on 01/15/2020) 60 tablet 1  . sertraline (ZOLOFT) 100 MG tablet  TAKE 1 TABLET BY MOUTH EVERY DAY 90 tablet 2  . triamterene-hydrochlorothiazide (DYAZIDE) 37.5-25 MG capsule Take 1 each (1 capsule total) by mouth every morning. 30 capsule 3  . tucatinib (TUKYSA) 150 MG tablet Take 1 tablet (150 mg total) by mouth every morning. Take as directed by MD. 60 tablet 3   No current facility-administered medications for this visit.   Facility-Administered Medications Ordered in Other Visits  Medication Dose Route Frequency Provider Last Rate Last Admin  . sodium chloride 0.9 % injection 10 mL  10 mL Intravenous PRN Mannie Wineland, [name], MD   10 mL at 03/04/14 1421  . sodium chloride flush (NS) 0.9 % injection 10 mL  10 mL Intracatheter PRN [name] Nield, [name], MD   10 mL at 07/30/19 1627    OBJECTIVE: Middle-aged African-American woman who appears stated age  64:   02/03/20 1416  BP: 138/76  Pulse: (!) 105  Resp: 17  Temp: 98.7 F (37.1 C)  SpO2: 97%   Wt Readings from Last 3 Encounters:  02/03/20 197 lb 11.2 oz (89.7 kg)  01/16/20 216 lb (98 kg)  01/15/20 216 lb (98 kg)   Body mass index is 31.91 kg/m.    Sclerae unicteric, EOMs intact Wearing a mask Lungs no rales or rhonchi Heart regular rate and rhythm Abd soft, nontender, positive bowel sounds Neuro: nonfocal, well oriented, appropriate affect Breasts: Deferred   Photo 07/04/2017     Photo 08/23/2018    LAB RESULTS:.  Lab Results  Component Value Date   WBC 10.7 (H) 01/21/2020   NEUTROABS 9.3 (H) 01/21/2020   HGB 10.3 (L) 01/21/2020   HCT 32.8 (L) 01/21/2020     MCV 91.9 01/21/2020   PLT 179 01/21/2020      Chemistry      Component Value Date/Time   NA 135 01/21/2020 0907   NA 142 11/09/2017 1214   K 4.5 01/21/2020 0907   K 3.9 11/09/2017 1214   CL 99 01/21/2020 0907   CL 101 03/07/2013 1411   CO2 25 01/21/2020 0907   CO2 32 (H) 11/09/2017 1214   BUN 16 01/21/2020 0907   BUN 19.1 11/09/2017 1214   CREATININE 0.83 01/21/2020 0907   CREATININE 0.99 01/15/2020 1020   CREATININE 1.0 11/09/2017 1214      Component Value Date/Time   CALCIUM 9.0 01/21/2020 0907   CALCIUM 9.1 11/09/2017 1214   ALKPHOS 96 01/21/2020 0907   ALKPHOS 120 11/09/2017 1214   AST 44 (H) 01/21/2020 0907   AST 25 01/15/2020 1020   AST 30 11/09/2017 1214   ALT 43 01/21/2020 0907   ALT 42 01/15/2020 1020   ALT 19 11/09/2017 1214   BILITOT 0.6 01/21/2020 0907   BILITOT 0.3 01/15/2020 1020   BILITOT <0.22 11/09/2017 1214       STUDIES: DG Chest 2 View  Result Date: 01/25/2020 CLINICAL DATA:  Cough and shortness of breath. History of breast cancer. Patient is unsteady on her feet. Patient has difficulty holding still. EXAM: CHEST - 2 VIEW COMPARISON:  01/21/2020 FINDINGS: Patient's LEFT-sided power port, tip to the superior vena cava. Heart is UPPER limits normal. There are patchy infiltrates in the lungs persist, but there has been some improvement in aeration accounting for differences in technique. No pleural effusions. RIGHT axillary node dissection. Patient motion artifact. IMPRESSION: Persistent but improved bilateral infiltrates. Electronically Signed   By: Nolon Nations [name]   On: 01/25/2020 16:54   DG Chest 2 View  Result Date:  01/21/2020 CLINICAL DATA:  Leg swelling. Additional history provided: Bilateral lower extremity swelling, right arm swelling, history of breast cancer, multiple falls. EXAM: CHEST - 2 VIEW COMPARISON:  Chest radiographs 01/16/2020 and earlier FINDINGS: Stable position of a left chest infusion port catheter with tip projecting in  the region of the right atrium. Heart size at the upper limits of normal, unchanged. Bilateral interstitial and airspace opacities, more conspicuous as compared to prior exam 01/16/2020. No evidence of pleural effusion or pneumothorax. Thoracic spondylosis. IMPRESSION: Bilateral interstitial and ill-defined airspace opacities, more conspicuous as compared to prior exam 01/16/2020. Findings may reflect pulmonary edema. Infection cannot be excluded. Electronically Signed   By: Kellie Simmering DO   On: 01/21/2020 11:42   DG Chest 2 View  Result Date: 01/16/2020 CLINICAL DATA:  Hypoxia and lower extremity edema EXAM: CHEST - 2 VIEW COMPARISON:  November 19, 2019. FINDINGS: Port-A-Cath tip is at the cavoatrial junction. No pneumothorax. Lungs are clear. Heart is upper normal in size with pulmonary vascularity normal. No adenopathy. There is degenerative change in the thoracic spine. IMPRESSION: Port-A-Cath tip at cavoatrial junction. Lungs clear. Cardiac silhouette within normal limits. Electronically Signed   By: Lowella Grip III [name]   On: 01/16/2020 12:51   DG Forearm Right  Result Date: 01/21/2020 CLINICAL DATA:  Right arm pain and swelling EXAM: RIGHT FOREARM - 2 VIEW COMPARISON:  None. FINDINGS: There is no evidence of fracture or other focal bone lesions. No cortical destruction or periostitis. Diffuse circumferential soft tissue swelling is evident throughout the forearm. No soft tissue gas or radiopaque foreign body is seen. IMPRESSION: Diffuse circumferential soft tissue swelling throughout the forearm. No acute bony abnormality. Electronically Signed   By: Davina Poke D.O.   On: 01/21/2020 11:44   DG Tibia/Fibula Left  Result Date: 01/21/2020 CLINICAL DATA:  Left leg pain EXAM: LEFT TIBIA AND FIBULA - 2 VIEW COMPARISON:  12/29/2010 FINDINGS: There is no evidence of fracture or other focal bone lesions. No cortical destruction or periostitis. Mineralization is again noted within the soft  tissues medial to the mid tibial diaphysis, unchanged from prior study 12/29/2010. Soft tissues are otherwise unremarkable. IMPRESSION: Negative. Electronically Signed   By: Davina Poke D.O.   On: 01/21/2020 11:45   CT Head Wo Contrast  Result Date: 01/16/2020 CLINICAL DATA:  Altered mental status. Positive COVID-19 test last month. Breast cancer metastatic to the brain. EXAM: CT HEAD WITHOUT CONTRAST TECHNIQUE: Contiguous axial images were obtained from the base of the skull through the vertex without intravenous contrast. COMPARISON:  01/12/2020. Brain MR dated 09/04/2019. FINDINGS: Brain: Stable areas of low density in the right frontal and temporal lobes at the locations of the patient's known masses. Stable mild patchy white matter low density in both cerebral hemispheres. No intracranial hemorrhage or CT evidence of acute infarction. No mass effect. Vascular: No hyperdense vessel or unexpected calcification. Skull: Normal. Negative for fracture or focal lesion. Sinuses/Orbits: Unremarkable Other: None. IMPRESSION: 1. No acute abnormality. 2. Stable areas of low density in the right frontal and temporal lobes at the locations of the patient's known masses compatible with metastases. 3. Stable mild chronic small vessel white matter ischemic changes in both cerebral hemispheres. Electronically Signed   By: Claudie Revering [name]   On: 01/16/2020 12:33   CT Head Wo Contrast  Result Date: 01/12/2020 CLINICAL DATA:  Headache, intracranial hemorrhage suspected EXAM: CT HEAD WITHOUT CONTRAST TECHNIQUE: Contiguous axial images were obtained from the base of the skull through  the vertex without intravenous contrast. COMPARISON:  CT 11/19/2019, MR 09/04/2019 FINDINGS: Brain: Redemonstration ill-defined lesion with surrounding vasogenic edema in the right frontal lobe, the overall extent of which appears diminished from prior exam. Additional hypoattenuation in the right temporal pole compatible with a second known  lesion as well. No new areas of edema. No evidence of acute infarction, hemorrhage, hydrocephalus, extra-axial collection or mass lesion/mass effect. Vascular: No hyperdense vessel or unexpected calcification. Skull: No calvarial fracture or suspicious osseous lesion. No scalp swelling or hematoma. Mild hyperostosis frontalis interna, a benign incidental finding Sinuses/Orbits: Paranasal sinuses and mastoid air cells are predominantly clear. Included orbital structures are unremarkable. Other: None IMPRESSION: 1. Ill-defined lesions in the right frontal and temporal lobes with surrounding vasogenic edema, compatible with known metastatic disease. Overall the extent of edema is diminished from prior exam. No new areas concerning for additional metastatic disease. 2. No other acute intracranial abnormality. Electronically Signed   By: Lovena Le [name]   On: 01/12/2020 20:44   MR Brain W Wo Contrast  Result Date: 02/01/2020 CLINICAL DATA:  Follow-up breast cancer with brain metastases. Status post radiation therapy 11-10/2019. EXAM: MRI HEAD WITHOUT AND WITH CONTRAST TECHNIQUE: Multiplanar, multiecho pulse sequences of the brain and surrounding structures were obtained without and with intravenous contrast. CONTRAST:  7.43m GADAVIST GADOBUTROL 1 MMOL/ML IV SOLN COMPARISON:  Head CT 01/16/2020 and MRI 09/04/2019 FINDINGS: The study is mildly motion degraded. Brain: A 2.8 x 2.0 x 1.9 cm (AP x transverse x craniocaudal) heterogeneously enhancing right frontal mass and a 4.9 x 2.4 x 2.0 cm right temporal mass have both decreased in size from the prior MRI with both appearing partially cystic. There is persistent mild edema surrounding the right temporal lesion, while right frontal lobe edema has resolved. No new enhancing lesion is identified. There is no acute infarct, midline shift, or extra-axial fluid collection. Small foci of T2 hyperintensity in the cerebral white matter bilaterally have mildly increased and are  nonspecific but compatible with mild chronic small vessel ischemic disease. There is a mildly expanded partially empty sella. There is mild cerebral atrophy. Vascular: Major intracranial vascular flow voids are preserved. Skull and upper cervical spine: No destructive skull lesion. Diffusely diminished bone marrow T1 signal intensity in the included cervical spine, nonspecific but may be related to the patient's known anemia. Sinuses/Orbits: Unremarkable orbits. Clear paranasal sinuses. Small left mastoid effusion. Other: None. IMPRESSION: 1. Decreased size of right frontal and right temporal lobe metastases. Mild residual right temporal edema without significant mass effect. 2. No evidence of new intracranial metastases or acute abnormality. Electronically Signed   By: ALogan BoresM.D.   On: 02/01/2020 16:44   DG Hand Complete Right  Result Date: 01/21/2020 CLINICAL DATA:  Swelling, history breast cancer EXAM: RIGHT HAND - COMPLETE 3+ VIEW COMPARISON:  None FINDINGS: Osseous demineralization. Scattered mild degenerative changes greatest [facility] and [facility] joint thumb. Scattered soft tissue swelling. Fingers superimposed on lateral view limiting assessment. No acute fracture, dislocation or bone destruction. IMPRESSION: Scattered degenerative changes and soft tissue swelling. No acute osseous abnormalities. Electronically Signed   By: MLavonia DanaM.D.   On: 01/21/2020 11:42   VAS [place]OWER EXTREMITY VENOUS (DVT) ([facility] and [facility] 7a-7p)  Result Date: 01/21/2020  Lower Venous DVTStudy Indications: Pain, and Swelling.  Limitations: Poor ultrasound/tissue interface and patient positioning, patient movement. Comparison Study: 01/16/2020 - Negative for DVT. Performing Technologist: GOliver HumRVT  Examination Guidelines: A complete evaluation includes B-mode  imaging, spectral Doppler, color Doppler, and power Doppler as needed of all accessible portions of each vessel. Bilateral testing is considered an  integral part of a complete examination. Limited examinations for reoccurring indications may be performed as noted. The reflux portion of the exam is performed with the patient in reverse Trendelenburg.  +---------+---------------+---------+-----------+----------+--------------+ RIGHT    CompressibilityPhasicitySpontaneityPropertiesThrombus Aging +---------+---------------+---------+-----------+----------+--------------+ CFV      Full           Yes      Yes                                 +---------+---------------+---------+-----------+----------+--------------+ SFJ      Full                                                        +---------+---------------+---------+-----------+----------+--------------+ FV Prox  Full                                                        +---------+---------------+---------+-----------+----------+--------------+ FV Mid   Full                                                        +---------+---------------+---------+-----------+----------+--------------+ FV DistalFull                                                        +---------+---------------+---------+-----------+----------+--------------+ PFV      Full                                                        +---------+---------------+---------+-----------+----------+--------------+ POP      Full           Yes      Yes                                 +---------+---------------+---------+-----------+----------+--------------+ PTV      Full                                                        +---------+---------------+---------+-----------+----------+--------------+ PERO     Full                                                        +---------+---------------+---------+-----------+----------+--------------+   +---------+---------------+---------+-----------+----------+--------------+  LEFT     CompressibilityPhasicitySpontaneityPropertiesThrombus  Aging +---------+---------------+---------+-----------+----------+--------------+ CFV      Full           Yes      Yes                                 +---------+---------------+---------+-----------+----------+--------------+ SFJ      Full                                                        +---------+---------------+---------+-----------+----------+--------------+ FV Prox  Full                                                        +---------+---------------+---------+-----------+----------+--------------+ FV Mid   Full                                                        +---------+---------------+---------+-----------+----------+--------------+ FV DistalFull                                                        +---------+---------------+---------+-----------+----------+--------------+ PFV      Full                                                        +---------+---------------+---------+-----------+----------+--------------+ POP      Full           Yes      Yes                                 +---------+---------------+---------+-----------+----------+--------------+ PTV      Full                                                        +---------+---------------+---------+-----------+----------+--------------+ PERO     Full                                                        +---------+---------------+---------+-----------+----------+--------------+     Summary: RIGHT: - There is no evidence of deep vein thrombosis in the lower extremity.  - No cystic structure found in the popliteal fossa.  LEFT: - There is no evidence of deep vein thrombosis in the lower extremity.  - No  cystic structure found in the popliteal fossa.  *See table(s) above for measurements and observations. Electronically signed by Harold Barban MD on 01/21/2020 at 9:44:28 PM.    Final    VAS [name] LOWER EXTREMITY VENOUS (DVT) (ONLY [facility] & [facility])  Result Date: 01/16/2020   Lower Venous DVTStudy Indications: Swelling.  Limitations: Body habitus and poor ultrasound/tissue interface. Comparison Study: No prior exam. Performing Technologist: Baldwin Crown ARDMS, RVT  Examination Guidelines: A complete evaluation includes B-mode imaging, spectral Doppler, color Doppler, and power Doppler as needed of all accessible portions of each vessel. Bilateral testing is considered an integral part of a complete examination. Limited examinations for reoccurring indications may be performed as noted. The reflux portion of the exam is performed with the patient in reverse Trendelenburg.  +---------+---------------+---------+-----------+----------+------------------+ RIGHT    CompressibilityPhasicitySpontaneityPropertiesThrombus Aging     +---------+---------------+---------+-----------+----------+------------------+ CFV      Full           Yes      Yes                                     +---------+---------------+---------+-----------+----------+------------------+ SFJ      Full                                                            +---------+---------------+---------+-----------+----------+------------------+ FV Prox  Full                                                            +---------+---------------+---------+-----------+----------+------------------+ FV Mid   Full                                                            +---------+---------------+---------+-----------+----------+------------------+ FV DistalFull                                                            +---------+---------------+---------+-----------+----------+------------------+ PFV      Full                                                            +---------+---------------+---------+-----------+----------+------------------+ POP      Full           Yes      Yes                                      +---------+---------------+---------+-----------+----------+------------------+ PTV  Full                                         visualized with                                                          color              +---------+---------------+---------+-----------+----------+------------------+ PERO     Full                                         visualized with                                                          color              +---------+---------------+---------+-----------+----------+------------------+   +---------+---------------+---------+-----------+----------+------------------+ LEFT     CompressibilityPhasicitySpontaneityPropertiesThrombus Aging     +---------+---------------+---------+-----------+----------+------------------+ CFV      Full           Yes      Yes                                     +---------+---------------+---------+-----------+----------+------------------+ SFJ      Full                                                            +---------+---------------+---------+-----------+----------+------------------+ FV Prox  Full                                                            +---------+---------------+---------+-----------+----------+------------------+ FV Mid   Full                                                            +---------+---------------+---------+-----------+----------+------------------+ FV DistalFull                                         visualized with  color              +---------+---------------+---------+-----------+----------+------------------+ PFV      Full                                                            +---------+---------------+---------+-----------+----------+------------------+ POP      Full           Yes      Yes                                      +---------+---------------+---------+-----------+----------+------------------+ PTV      Full                                         visualized with                                                          color              +---------+---------------+---------+-----------+----------+------------------+ PERO     Full                                         visualized with                                                          color              +---------+---------------+---------+-----------+----------+------------------+     Summary: BILATERAL: - No evidence of deep vein thrombosis seen in the lower extremities, bilaterally.  RIGHT: - Portions of this examination were limited- see technologist comments above. - No cystic structure found in the popliteal fossa.  LEFT: - Portions of this examination were limited- see technologist comments above. - No cystic structure found in the popliteal fossa.  *See table(s) above for measurements and observations. Electronically signed by Monica Martinez MD on 01/16/2020 at 4:22:13 PM.    Final      ASSESSMENT:  64 y.o. [place] woman with stage IV breast cancer manifested chiefly by loco-regional nodal disease (neck, chest) and skin involvement, without liver, bone, or brain metastases documented   (1) Status post right upper inner quadrant lumpectomy and sentinel lymph node sampling 03/04/2002 for 2 separate foci of invasive ductal carcinoma, mpT2 pN1 or stage IIB, both foci grade 3, both estrogen and progesterone receptor positive, both HercepTest 3+, Mib-1 56%  (2) Reexcision for margins 05/27/2002 showed no residual cancer in the breast.  (3) The patient refused adjuvant systemic therapy.  (4) Adjuvant radiation treatment completed 08/14/2002.  (5) recurrence in the right breast in 02/2004 showing a morphologically different tumor, again grade 3, again estrogen and progesterone receptor negative,  with an MIB-1 of 14% and  Herceptest 3+.  (5) Between 03/2004 and 07/2004 she received dose dense Doxorubicin/Cyclophosphamide x 4 given with trastuzumab, followed by weekly Docetaxel x 8, again given with trastuzumab.  (6) Right mastectomy 07/13/2004 showed scattered microscopic foci of residual disease over an area  greater than 5 cm. Margins were negative.  (7) Postoperative Docetaxel continued until 09/2004.  (8) Trastuzumab (Herceptin) given 08/2004 through 01/2012 with some brief interruptions.  RECURRENT/ STAGE IV DISEASE December 2006 (9) Isolated right cervical nodal recurrence 10/2005, treated with radiation to the right supraclavicular area (total 41.5 gray) completed 12/29/2005.  (10) Navelbine given together with Herceptin  11/2005 through 03/2006.  (9) Left mastectomy 02/13/2006 for ductal carcinoma in situ, grade 2, estrogen and progesterone receptor negative, with negative margins; 0 of 3 lymph nodes involved  (10) treated with Lapatinib and Capecitabine before 10/2009, for an unclear duration and with unclear results (cannot locate data on chart review).  (11) Status post right supraclavicular lymph node biopsy 09/2010 again positive for an invasive ductal carcinoma, estrogen and progesterone receptor negative, HER-2 positive by C[facility] with a ratio 4.25.  (12) Navelbine given together with Herceptin between 05/2011 and 11/2011.  (13) Carboplatin/ Gemcitabine/ Herceptin given for 2 cycles, in 12/2011 and 01/2012.  (14) TDM-1 (Kadcyla) started 02/2012. Last dose 10/02/2013 after which the patient discontinued treatment at her own discretion. Echo on December 2014 showed a well preserved ejection fraction.  (15) Deep vein thrombosis of the right upper extremity documented 04/20/2011.  She completed anticoagulant therapy with Coumadin on 03/25/2013.  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   (a) pretreatment echocardiogram 06/06/2016 showed a 60-65% ejection fraction  (b) significant compliance problems compromise response to treatment  (c)  echocardiogram 02/20/17 LVEF 55-60%  (d) Abraxane discontinued 07/04/2017 because of possible neuropathy symptoms  (e) Abraxane resumed 09/27/2017  (f) echocardiogram 07/20/2017 showed an ejection fraction of 60-65%  (g) echo 01/22/2018 showed an ejection fraction of 50-55%  (h) CT scan of the chest 05/03/2018 shows no disease involving the lungs liver or lymph nodes, with stable subcutaneous masses as previously noted  (I) Abraxane discontinued August 2019 because of concerns regarding neuropathy-- gemcitabine substituted  (j) echocardiogram 08/20/2018 shows an ejection fraction in the 55-60% range (done every 6 months)  (k) gemcitabine discontinued because of multiple symptoms, Abraxane restarted 09/11/2018, given once every 4 weeks  (l) echocardiogram 07/17/2019 shows a well-preserved ejection fraction at 55-60%  (m) restaging PET scan 08/20/2019 shows hypermetabolic adenopathy, subcutaneous malignancy as clinically appreciated, but no liver or lung parenchymal lesions   (21) Chronic pain secondary to known metastatic disease  (a) patient signed pain contract 10/19/2016  (b) as of 11/16/2016  receives her pain medications from Dr [name]  (c)  Dr [name] no longer able to prescribe as of February 2019  (  d) all pain medications now through [facility], Dr Mirna Mires (as of April 2019)  (22) left breast and left axillary adenopathy noted clinically  (a) left axillary lymph node biopsy on 07/24/2019 benign  (23) BRAIN METASTASES: frontal and temporal lobe metastases noted on brain MRI 09/04/2019  (a) adjuvant radiation 09/11/2019-10/16/2019:    The brain was treated to 30 Gy in 10 fractions.   (b) tucatinib prescribed December 2020 at 150 mg daily because of methadone interaction, finally started March 2021.  (c) noncontrast head CT 01/12/2020 stable lesions/improved edema  (d) brain MRI 02/01/2020 shows decreased size of the right frontal and temporal lobe metastases with mild residual edema, no mass-effect, and no evidence of new mets  (24) COVID-19 infection 2019-11-19   PLAN: [name] is now more than 14 years out from initial diagnosis of metastatic breast cancer.  Her disease is generally well controlled and we reviewed today the fact that she is getting chemotherapy and immunotherapy for the peripheral disease which is mostly evidenced on her back and right arm.  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  The disease in the brain was just reevaluated with a brain MRI and it does show improvement even though she never did finish her complete radiation treatments.  She also had not started the tucatinib until last week.  She appears to be tolerating it well now and does not report any side effects related to this medication.  I reviewed with her daughter [name] the fact that she and her sister [name] need to manage all Briyah's medication and particularly the pain medication.  If the med pain medication is lost or misplaced or too much is taken then it is not going to be possible for her pain doctor to prescribe it again until the prescription is due  to be renewed.  If if the patient is taking the pain medicine appropriately and still has uncontrolled pain the family can call Dr. Mirna Mires and request that the pain prescription be increased.  I think they are very aware of this now and so long as the daughters are managing her medications I do not anticipate any further issues regarding that  I encouraged [name] to take the tucatinib which should prolong and perhaps improve her response in the brain.  We are of course continuing the peripheral treatments with Pertuzumab trastuzumab and Abraxane and she is due for her dose the first week in April.  Total encounter time 35 minutes.*  Aubrionna Istre, [name], MD  02/03/20 3:54 PM Medical Oncology and Hematology [facility] [place], [place] [address] Tel. [phone]    Fax. [phone]   I, Wilburn Mylar, am acting as scribe for Dr. [name]. Trai Ells.  I, Lurline Del MD, have reviewed the above documentation for accuracy and completeness, and I agree with the above.   *Total Encounter Time as defined by the Centers for Medicare and Medicaid Services includes, in addition to the face-to-face time of a patient visit (documented in the note above) non-face-to-face time: obtaining and reviewing outside history, ordering and reviewing medications, tests or procedures, care coordination (communications with other health care professionals or caregivers) and documentation in the medical record.

## 2020-02-10 ENCOUNTER — Telehealth: Payer: Self-pay | Admitting: *Deleted

## 2020-02-10 ENCOUNTER — Emergency Department (HOSPITAL_COMMUNITY): Payer: Medicare Other

## 2020-02-10 ENCOUNTER — Encounter (HOSPITAL_COMMUNITY): Payer: Self-pay | Admitting: Emergency Medicine

## 2020-02-10 ENCOUNTER — Inpatient Hospital Stay (HOSPITAL_COMMUNITY)
Admission: EM | Admit: 2020-02-10 | Discharge: 2020-02-14 | DRG: 689 | Disposition: A | Discharge status: Home Health | Payer: Medicare Other | Attending: Internal Medicine | Admitting: Internal Medicine

## 2020-02-10 ENCOUNTER — Other Ambulatory Visit: Payer: Self-pay

## 2020-02-10 DIAGNOSIS — Z91018 Allergy to other foods: Secondary | ICD-10-CM

## 2020-02-10 DIAGNOSIS — B964 Proteus (mirabilis) (morganii) as the cause of diseases classified elsewhere: Secondary | ICD-10-CM | POA: Diagnosis present

## 2020-02-10 DIAGNOSIS — Z87891 Personal history of nicotine dependence: Secondary | ICD-10-CM

## 2020-02-10 DIAGNOSIS — G8311 Monoplegia of lower limb affecting right dominant side: Secondary | ICD-10-CM | POA: Diagnosis present

## 2020-02-10 DIAGNOSIS — Z885 Allergy status to narcotic agent status: Secondary | ICD-10-CM

## 2020-02-10 DIAGNOSIS — E876 Hypokalemia: Secondary | ICD-10-CM | POA: Diagnosis present

## 2020-02-10 DIAGNOSIS — Z86718 Personal history of other venous thrombosis and embolism: Secondary | ICD-10-CM

## 2020-02-10 DIAGNOSIS — G8314 Monoplegia of lower limb affecting left nondominant side: Secondary | ICD-10-CM | POA: Diagnosis present

## 2020-02-10 DIAGNOSIS — Z79899 Other long term (current) drug therapy: Secondary | ICD-10-CM

## 2020-02-10 DIAGNOSIS — N3000 Acute cystitis without hematuria: Secondary | ICD-10-CM | POA: Diagnosis not present

## 2020-02-10 DIAGNOSIS — Z8616 Personal history of COVID-19: Secondary | ICD-10-CM

## 2020-02-10 DIAGNOSIS — M25561 Pain in right knee: Secondary | ICD-10-CM | POA: Diagnosis present

## 2020-02-10 DIAGNOSIS — Z9013 Acquired absence of bilateral breasts and nipples: Secondary | ICD-10-CM

## 2020-02-10 DIAGNOSIS — Z803 Family history of malignant neoplasm of breast: Secondary | ICD-10-CM

## 2020-02-10 DIAGNOSIS — Z923 Personal history of irradiation: Secondary | ICD-10-CM

## 2020-02-10 DIAGNOSIS — M25562 Pain in left knee: Secondary | ICD-10-CM | POA: Diagnosis present

## 2020-02-10 DIAGNOSIS — Z888 Allergy status to other drugs, medicaments and biological substances status: Secondary | ICD-10-CM

## 2020-02-10 DIAGNOSIS — R531 Weakness: Secondary | ICD-10-CM

## 2020-02-10 DIAGNOSIS — G8929 Other chronic pain: Secondary | ICD-10-CM | POA: Diagnosis present

## 2020-02-10 DIAGNOSIS — I890 Lymphedema, not elsewhere classified: Secondary | ICD-10-CM | POA: Diagnosis present

## 2020-02-10 DIAGNOSIS — N390 Urinary tract infection, site not specified: Secondary | ICD-10-CM | POA: Diagnosis present

## 2020-02-10 DIAGNOSIS — Z170 Estrogen receptor positive status [ER+]: Secondary | ICD-10-CM

## 2020-02-10 DIAGNOSIS — C7931 Secondary malignant neoplasm of brain: Secondary | ICD-10-CM | POA: Diagnosis present

## 2020-02-10 DIAGNOSIS — D493 Neoplasm of unspecified behavior of breast: Secondary | ICD-10-CM | POA: Diagnosis present

## 2020-02-10 DIAGNOSIS — K219 Gastro-esophageal reflux disease without esophagitis: Secondary | ICD-10-CM | POA: Diagnosis present

## 2020-02-10 DIAGNOSIS — G9341 Metabolic encephalopathy: Secondary | ICD-10-CM | POA: Diagnosis present

## 2020-02-10 DIAGNOSIS — C50211 Malignant neoplasm of upper-inner quadrant of right female breast: Secondary | ICD-10-CM | POA: Diagnosis present

## 2020-02-10 DIAGNOSIS — I10 Essential (primary) hypertension: Secondary | ICD-10-CM | POA: Diagnosis present

## 2020-02-10 DIAGNOSIS — Z79891 Long term (current) use of opiate analgesic: Secondary | ICD-10-CM

## 2020-02-10 LAB — CBC WITH DIFFERENTIAL/PLATELET
Abs Immature Granulocytes: 0.27 10*3/uL — ABNORMAL HIGH (ref 0.00–0.07)
Basophils Absolute: 0.1 10*3/uL (ref 0.0–0.1)
Basophils Relative: 1 %
Eosinophils Absolute: 0.2 10*3/uL (ref 0.0–0.5)
Eosinophils Relative: 3 %
HCT: 33.3 % — ABNORMAL LOW (ref 36.0–46.0)
Hemoglobin: 10.4 g/dL — ABNORMAL LOW (ref 12.0–15.0)
Immature Granulocytes: 4 %
Lymphocytes Relative: 8 %
Lymphs Abs: 0.6 10*3/uL — ABNORMAL LOW (ref 0.7–4.0)
MCH: 28.7 pg (ref 26.0–34.0)
MCHC: 31.2 g/dL (ref 30.0–36.0)
MCV: 91.7 fL (ref 80.0–100.0)
Monocytes Absolute: 0.8 10*3/uL (ref 0.1–1.0)
Monocytes Relative: 11 %
Neutro Abs: 5.6 10*3/uL (ref 1.7–7.7)
Neutrophils Relative %: 73 %
Platelets: 239 10*3/uL (ref 150–400)
RBC: 3.63 MIL/uL — ABNORMAL LOW (ref 3.87–5.11)
RDW: 16.5 % — ABNORMAL HIGH (ref 11.5–15.5)
WBC: 7.5 10*3/uL (ref 4.0–10.5)
nRBC: 0 % (ref 0.0–0.2)

## 2020-02-10 LAB — URINALYSIS, ROUTINE W REFLEX MICROSCOPIC
Bilirubin Urine: NEGATIVE
Glucose, UA: NEGATIVE mg/dL
Hgb urine dipstick: NEGATIVE
Ketones, ur: NEGATIVE mg/dL
Nitrite: NEGATIVE
Protein, ur: 100 mg/dL — AB
Specific Gravity, Urine: 1.019 (ref 1.005–1.030)
WBC, UA: >50 WBC/hpf — ABNORMAL HIGH (ref 0–5)
pH: 6 (ref 5.0–8.0)

## 2020-02-10 LAB — COMPREHENSIVE METABOLIC PANEL
ALT: 26 U/L (ref 0–44)
AST: 51 U/L — ABNORMAL HIGH (ref 15–41)
Albumin: 3 g/dL — ABNORMAL LOW (ref 3.5–5.0)
Alkaline Phosphatase: 89 U/L (ref 38–126)
Anion gap: 10 (ref 5–15)
BUN: 16 mg/dL (ref 8–23)
CO2: 25 mmol/L (ref 22–32)
Calcium: 8.9 mg/dL (ref 8.9–10.3)
Chloride: 101 mmol/L (ref 98–111)
Creatinine, Ser: 0.9 mg/dL (ref 0.44–1.00)
GFR calc Af Amer: >60 mL/min (ref >60)
GFR calc non Af Amer: >60 mL/min (ref >60)
Glucose, Bld: 99 mg/dL (ref 70–99)
Potassium: 3.2 mmol/L — ABNORMAL LOW (ref 3.5–5.1)
Sodium: 136 mmol/L (ref 135–145)
Total Bilirubin: 0.8 mg/dL (ref 0.3–1.2)
Total Protein: 7.1 g/dL (ref 6.5–8.1)

## 2020-02-10 LAB — RAPID URINE DRUG SCREEN, HOSP PERFORMED
Amphetamines: NOT DETECTED
Barbiturates: NOT DETECTED
Benzodiazepines: POSITIVE — AB
Cocaine: NOT DETECTED
Opiates: POSITIVE — AB
Tetrahydrocannabinol: POSITIVE — AB

## 2020-02-10 NOTE — ED Provider Notes (Signed)
[facility] DEPT Provider Note   CSN: [id number] Arrival date & time: 02/10/20  1501     History Chief Complaint  Patient presents with  . Leg Pain    [name] is a 64 y.o. female.  64 y.o female with a PMH of DVT, HTN, malignant breast neoplasm presents to the ED with a chief complaint of bilateral leg pain. According to EMS report patient was getting out of bed this morning when her legs gave up from under her. Patient is very difficult to obtain history from. Level 5 caveat.   The history is provided by the patient and medical records.  Leg Pain      Past Medical History:  Diagnosis Date  . Breast cancer ([place])   . DVT (deep venous thrombosis) ([place]) 10/07/2011  . Fever 02/06/2018  . Hypertension   . Radiation 11/29/2005-12/29/2005   right supraclavicular area 4147 cGy  . Radiation 06/26/02-08/14/02   right breast 5040 cGy, tumor bed boosted to 1260 cGy  . Rheumatic fever     Patient Active Problem List   Diagnosis Date Noted  . Drug-induced polyneuropathy ([place]) 12/19/2019  . Acute respiratory failure with hypoxia ([place]) 11/19/2019  . COVID-19 virus infection 11/19/2019  . Overdose opiate, accidental or unintentional, initial encounter ([place]) 11/19/2019  . Acute encephalopathy 11/19/2019  . Metastasis to brain ([place]) 09/13/2019  . Acute lower UTI 02/06/2018  . Altered mental status 02/06/2018  . Bacteremia 02/06/2018  . Polypharmacy 02/06/2018  . Goals of care, counseling/discussion 09/05/2017  . Pneumonia 01/25/2017  . CAP (community acquired pneumonia) 01/23/2017  . Port catheter in place 05/30/2016  . 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 Chronic pain 04/13/2015  . Left arm pain 08/18/2014  . Malignant neoplasm of upper-inner quadrant of right breast in female, estrogen receptor positive ([place]) 10/03/2013  . Post-lymphadenectomy lymphedema of arm 08/15/2013  . Port or reservoir infection 01/29/2013  .  DVT (deep venous thrombosis) ([place]) 10/07/2011  . Chest pain 09/10/2009  . Secondary cardiomyopathy ([place]) 09/02/2009  . Essential hypertension 02/26/2009  . GERD 02/26/2009    Past Surgical History:  Procedure Laterality Date  . BREAST SURGERY    . MASTECTOMY Bilateral   . PORT A CATH REVISION       OB History   No obstetric history on file.     Family History  Problem Relation Age of Onset  . Pancreatic cancer Mother   . Kidney cancer Sister 71  . Breast cancer Sister 15  . Breast cancer Other 20    Social History   Tobacco Use  . Smoking status: Former Research scientist (life sciences)  . Smokeless tobacco: Never Used  Substance Use Topics  . Alcohol use: Yes    Comment: Rarely  . Drug use: No    Home Medications Prior to Admission medications   Medication Sig Start Date End Date Taking? Authorizing Provider  allopurinol (ZYLOPRIM) 300 MG tablet Take 300 mg by mouth daily. 01/20/20  Yes [provider]  ALPRAZolam Duanne Moron) 1 MG tablet Take 1 tablet (1 mg total) by mouth at bedtime as needed for sleep. 01/13/20  Yes Truitt Merle, MD  Ascorbic Acid (VITAMIN C PO) Take 1 tablet by mouth daily.   Yes [provider]  cholecalciferol (VITAMIN D3) 25 MCG (1000 UNIT) tablet Take 1,000 Units by mouth daily.   Yes [provider]  furosemide (LASIX) 20 MG tablet Take 1 tablet (20 mg total) by mouth daily. 01/21/20  Yes  Sherwood Gambler, MD  gabapentin (NEURONTIN) 100 MG capsule Take 100 mg by mouth 3 (three) times daily.   Yes [provider]  KLOR-CON M20 20 MEQ tablet Take 20 mEq by mouth daily. 01/15/20  Yes [provider]  magic mouthwash SOLN Take 10 mLs by mouth 4 (four) times daily as needed for mouth pain. Swish and spit.  10/18/19  Yes [provider]  methadone (DOLOPHINE) 10 MG tablet Take 10 mg by mouth. Bottle states 1 tab 5 times a day with fill date of 01/11/2020 per Dr Mirna Mires   Yes [provider]  Multiple Vitamin (MULTIVITAMIN)  capsule Take 1 capsule by mouth daily.     Yes [provider]  omeprazole (PRILOSEC) 40 MG capsule Take 1 capsule (40 mg total) by mouth daily. 02/04/20  Yes [name], [name], MD  ondansetron (ZOFRAN) 4 MG tablet TAKE 1 TABLET BY MOUTH THREE TIMES A DAY AS NEEDED Patient taking differently: Take 4 mg by mouth every 8 (eight) hours as needed for nausea or vomiting.  01/23/20  Yes [name], [name], MD  Oxycodone HCl 20 MG TABS Take 1 tablet (20 mg total) by mouth 3 (three) times daily as needed (pain). 01/12/20  Yes Hayden Rasmussen, MD  prochlorperazine (COMPAZINE) 10 MG tablet Take 1 tablet (10 mg total) by mouth every 6 (six) hours as needed for nausea or vomiting. 01/13/20  Yes Truitt Merle, MD  sertraline (ZOLOFT) 100 MG tablet TAKE 1 TABLET BY MOUTH EVERY DAY 12/23/19  Yes [name], [name], MD  triamterene-hydrochlorothiazide (DYAZIDE) 37.5-25 MG capsule Take 1 each (1 capsule total) by mouth every morning. 07/30/19  Yes [name], [name], MD  tucatinib (TUKYSA) 150 MG tablet Take 1 tablet (150 mg total) by mouth every morning. Take as directed by MD. 12/18/19  Yes [name], [name], MD  cholecalciferol (VITAMIN D) 1000 units tablet Take 1 tablet (1,000 Units total) by mouth daily. Patient not taking: Reported on 11/19/2019 05/16/18   [name], [name], MD  naloxone [facility]) nasal spray 4 mg/0.1 mL Administer single spray of naloxone into one nostril. Additionally, spray can be given every 2-3 minutes as needed until emergency medical assistance arrives. Call 911 immediately after use. Patient not taking: Reported on 01/15/2020 10/25/19   [name], [name], MD  polyethylene glycol (MIRALAX / GLYCOLAX) 17 g packet Take 17 g by mouth daily as needed for mild constipation. Patient not taking: Reported on 01/15/2020 11/21/19   Mercy Riding, MD  senna-docusate (SENOKOT-S) 8.6-50 MG tablet Take 1 tablet by mouth 2 (two) times daily as needed for moderate constipation. Patient not taking: Reported  on 01/15/2020 11/21/19   Mercy Riding, MD    Allergies    Tramadol, Hydrocodone-acetaminophen, and Pork-derived products  Review of Systems   Review of Systems  Unable to perform ROS: Other    Physical Exam Updated Vital Signs BP 107/75   Pulse 93   Temp 99.7 F (37.6 C) (Oral)   Resp (!) 22   SpO2 92%   Physical Exam Vitals and nursing note reviewed.     ED Results / Procedures / Treatments   Labs (all labs ordered are listed, but only abnormal results are displayed) Labs Reviewed  CBC WITH DIFFERENTIAL/PLATELET - Abnormal; Notable for the following components:      Result Value   RBC 3.63 (*)    Hemoglobin 10.4 (*)    HCT 33.3 (*)    RDW 16.5 (*)    Lymphs  Abs 0.6 (*)    Abs Immature Granulocytes 0.27 (*)    All other components within normal limits  COMPREHENSIVE METABOLIC PANEL - Abnormal; Notable for the following components:   Potassium 3.2 (*)    Albumin 3.0 (*)    AST 51 (*)    All other components within normal limits  RAPID URINE DRUG SCREEN, HOSP PERFORMED - Abnormal; Notable for the following components:   Opiates POSITIVE (*)    Benzodiazepines POSITIVE (*)    Tetrahydrocannabinol POSITIVE (*)    All other components within normal limits  URINALYSIS, ROUTINE W REFLEX MICROSCOPIC - Abnormal; Notable for the following components:   Color, Urine AMBER (*)    APPearance CLOUDY (*)    Protein, ur 100 (*)    Leukocytes,Ua MODERATE (*)    WBC, UA >50 (*)    Bacteria, UA MANY (*)    All other components within normal limits    EKG None  Radiology DG Chest 2 View  Result Date: 02/10/2020 CLINICAL DATA:  Altered mental status. EXAM: CHEST - 2 VIEW COMPARISON:  January 25, 2020 FINDINGS: There is a well-positioned left-sided Port-A-Cath. The heart size is stable. Areas of scarring and atelectasis are noted bilaterally. There is no pneumothorax. There are degenerative changes throughout the visualized thoracic spine. IMPRESSION: No active cardiopulmonary  disease. Electronically Signed   By: Constance Holster [name]   On: 02/10/2020 23:19   CT Head Wo Contrast  Result Date: 02/10/2020 CLINICAL DATA:  64 year old female with headache. History of metastatic breast cancer. EXAM: CT HEAD WITHOUT CONTRAST TECHNIQUE: Contiguous axial images were obtained from the base of the skull through the vertex without intravenous contrast. COMPARISON:  Head CT dated 01/16/2020. FINDINGS: Brain: The ventricles and sulci appropriate size for patient's age. Areas of white matter hypodensity involving the inferior right frontal and right temporal lobes similar to prior CT in keeping with metastatic disease. There is no acute intracranial hemorrhage. No mass effect or midline shift. No extra-axial fluid collection. Vascular: No hyperdense vessel or unexpected calcification. Skull: Normal. Negative for fracture or focal lesion. Sinuses/Orbits: The visualized paranasal sinuses and the right mastoid air cells are clear. Mild left mastoid effusions. No air-fluid level. Other: None IMPRESSION: 1. No acute intracranial hemorrhage. 2. Similar appearance of right frontal and temporal white matter hypodensity in keeping with metastatic disease. Electronically Signed   By: Anner Crete [name]   On: 02/10/2020 23:10    Procedures Procedures (including critical care time)  Medications Ordered in ED Medications  potassium chloride 10 mEq in 100 mL IVPB (has no administration in time range)    ED Course  I have reviewed the triage vital signs and the nursing notes.  Pertinent labs & imaging results that were available during my care of the patient were reviewed by me and considered in my medical decision making (see chart for details).  Clinical Course as of Feb 10 14  Tue Feb 11, 2020  0004 Chalmers Guest): MODERATE [JS]  0004 WBC, UA(!): >50 [JS]  0004 Bacteria, UA(!): MANY [JS]    Clinical Course User Index [JS] Janeece Fitting, PA-C   MDM  Rules/Calculators/A&P   Patient presents to the ED for bilateral leg weakness, according to daughter who I have obtained collateral information from patient attempted to ambulate to the bathroom, reported that she felt very weak, fell down to her knees.  She was asked if she went to be seen in the hospital her daughter called EMS to help them with support,  patient asked to be seen in the ED.  During my evaluation patient is very drowsy, continues to fall asleep during her whole interview.  No focal point of tenderness on my exam.  According to daughter she did not strike her head.  Patient has been less responsive as the last couple of days.  According to her chart was of extensively reviewed, she was seen here last week for a similar complaint, thought to be due to polypharmacy.  CBC without any leukocytosis.  CMP with some mild hypokalemia, given IV potassium for replacement.  Creatinine level is within normal limits.  Patient does have a history of metastatic disease with known brain mets, unsure whether this is exacerbating her symptoms of weakness along with drowsiness.  UA with moderate leukocytosis, greater than 50 white blood cell count, many bacteria, suspicion that likely UTI is causing worsening mental status.  UDS is positive for opioids, benzos, THC, she was not given any medication for pain while in the ED.  According to PDMP review, she does have a prescription for possible him, hydrocodone which she has been taken.  Daughter reports she has been taking adequate amount of medication.  8:48 PM Attempted to contact family for collateral information. [place] daughter who lives with her, was having a hard time walking, patient stood up and had no strength to her legs, lowered herself to the ground.  Requested that EMS bring her to the hospital she was having worsening weakness. [name] daughter  CT of her head was obtained to further evaluate altered mental status. 1. No acute  intracranial hemorrhage.  2. Similar appearance of right frontal and temporal white matter  hypodensity in keeping with metastatic disease.       X-ray without any consolation, pneumothorax, pleural effusion.  Patient was attempted to ambulate by nursing staff, she does not have a steady gait, suspect that likely UTI is worsening her unsteadiness.  12:15 AM spoke to hospitalist service who agreed on admitting patient for further management.  Portions of this note were generated with Lobbyist. Dictation errors may occur despite best attempts at proofreading.  Final Clinical Impression(s) / ED Diagnoses Final diagnoses:  Acute cystitis without hematuria  Polypharmacy  Weakness  Hypokalemia    Rx / DC Orders ED Discharge Orders    None       Janeece Fitting, PA-C 02/11/20 0015    Valarie Merino, MD 02/12/20 [phone]

## 2020-02-10 NOTE — ED Notes (Signed)
Patient placed on pure wick °

## 2020-02-10 NOTE — Telephone Encounter (Signed)
This RN retrieved VM left by pt earlier today - stating " hey Val, I went to get out of bed and stood up and then I fell " .  Upon opening chart to contact pt - noted pt was brought to the ER by EMS with weakness and report of falling.  This RN informed MD of above.

## 2020-02-10 NOTE — ED Notes (Signed)
Pt has purewick, yet no void for urinalysis .

## 2020-02-10 NOTE — ED Triage Notes (Addendum)
Per EMS, patient from home, reports bilateral leg pain x2 days and fall when trying to get out of the bed this morning. Denies neck and back pain and LOC. Hx metastatic breast cancer.  Patient drowsy during triage. Reports medications make her drowsy. A&Ox4.

## 2020-02-10 NOTE — ED Notes (Signed)
Patient ambulated a few steps around room, steps are shuffled, unbalanced, 1 assist needed, Provider aware

## 2020-02-10 NOTE — ED Notes (Signed)
ED Provider at bedside. 

## 2020-02-11 ENCOUNTER — Inpatient Hospital Stay (HOSPITAL_COMMUNITY): Payer: Medicare Other

## 2020-02-11 ENCOUNTER — Other Ambulatory Visit: Payer: Self-pay

## 2020-02-11 ENCOUNTER — Encounter (HOSPITAL_COMMUNITY): Payer: Self-pay | Admitting: Internal Medicine

## 2020-02-11 DIAGNOSIS — G8929 Other chronic pain: Secondary | ICD-10-CM | POA: Diagnosis present

## 2020-02-11 DIAGNOSIS — I10 Essential (primary) hypertension: Secondary | ICD-10-CM

## 2020-02-11 DIAGNOSIS — Z8616 Personal history of covid-19: Secondary | ICD-10-CM | POA: Diagnosis not present

## 2020-02-11 DIAGNOSIS — Z923 Personal history of irradiation: Secondary | ICD-10-CM | POA: Diagnosis not present

## 2020-02-11 DIAGNOSIS — Z9013 Acquired absence of bilateral breasts and nipples: Secondary | ICD-10-CM | POA: Diagnosis not present

## 2020-02-11 DIAGNOSIS — N3000 Acute cystitis without hematuria: Secondary | ICD-10-CM | POA: Diagnosis present

## 2020-02-11 DIAGNOSIS — K2100 Gastro-esophageal reflux disease with esophagitis, without bleeding: Secondary | ICD-10-CM | POA: Diagnosis not present

## 2020-02-11 DIAGNOSIS — E876 Hypokalemia: Secondary | ICD-10-CM

## 2020-02-11 DIAGNOSIS — Z79891 Long term (current) use of opiate analgesic: Secondary | ICD-10-CM | POA: Diagnosis not present

## 2020-02-11 DIAGNOSIS — G8311 Monoplegia of lower limb affecting right dominant side: Secondary | ICD-10-CM

## 2020-02-11 DIAGNOSIS — R531 Weakness: Secondary | ICD-10-CM

## 2020-02-11 DIAGNOSIS — N390 Urinary tract infection, site not specified: Secondary | ICD-10-CM | POA: Diagnosis present

## 2020-02-11 DIAGNOSIS — G9341 Metabolic encephalopathy: Secondary | ICD-10-CM | POA: Diagnosis present

## 2020-02-11 DIAGNOSIS — D493 Neoplasm of unspecified behavior of breast: Secondary | ICD-10-CM | POA: Diagnosis present

## 2020-02-11 DIAGNOSIS — C50211 Malignant neoplasm of upper-inner quadrant of right female breast: Secondary | ICD-10-CM | POA: Diagnosis present

## 2020-02-11 DIAGNOSIS — Z803 Family history of malignant neoplasm of breast: Secondary | ICD-10-CM | POA: Diagnosis not present

## 2020-02-11 DIAGNOSIS — M25561 Pain in right knee: Secondary | ICD-10-CM | POA: Diagnosis present

## 2020-02-11 DIAGNOSIS — G8314 Monoplegia of lower limb affecting left nondominant side: Secondary | ICD-10-CM | POA: Diagnosis present

## 2020-02-11 DIAGNOSIS — Z79899 Other long term (current) drug therapy: Secondary | ICD-10-CM | POA: Diagnosis not present

## 2020-02-11 DIAGNOSIS — I890 Lymphedema, not elsewhere classified: Secondary | ICD-10-CM | POA: Diagnosis present

## 2020-02-11 DIAGNOSIS — M25562 Pain in left knee: Secondary | ICD-10-CM | POA: Diagnosis present

## 2020-02-11 DIAGNOSIS — Z86718 Personal history of other venous thrombosis and embolism: Secondary | ICD-10-CM | POA: Diagnosis not present

## 2020-02-11 DIAGNOSIS — K219 Gastro-esophageal reflux disease without esophagitis: Secondary | ICD-10-CM | POA: Diagnosis present

## 2020-02-11 DIAGNOSIS — B964 Proteus (mirabilis) (morganii) as the cause of diseases classified elsewhere: Secondary | ICD-10-CM | POA: Diagnosis present

## 2020-02-11 DIAGNOSIS — C7931 Secondary malignant neoplasm of brain: Secondary | ICD-10-CM | POA: Diagnosis present

## 2020-02-11 DIAGNOSIS — Z87891 Personal history of nicotine dependence: Secondary | ICD-10-CM | POA: Diagnosis not present

## 2020-02-11 LAB — CBC WITH DIFFERENTIAL/PLATELET
Abs Immature Granulocytes: 0.22 10*3/uL — ABNORMAL HIGH (ref 0.00–0.07)
Basophils Absolute: 0 10*3/uL (ref 0.0–0.1)
Basophils Relative: 1 %
Eosinophils Absolute: 0.2 10*3/uL (ref 0.0–0.5)
Eosinophils Relative: 3 %
HCT: 31.5 % — ABNORMAL LOW (ref 36.0–46.0)
Hemoglobin: 9.9 g/dL — ABNORMAL LOW (ref 12.0–15.0)
Immature Granulocytes: 4 %
Lymphocytes Relative: 13 %
Lymphs Abs: 0.7 10*3/uL (ref 0.7–4.0)
MCH: 28.9 pg (ref 26.0–34.0)
MCHC: 31.4 g/dL (ref 30.0–36.0)
MCV: 91.8 fL (ref 80.0–100.0)
Monocytes Absolute: 0.9 10*3/uL (ref 0.1–1.0)
Monocytes Relative: 15 %
Neutro Abs: 3.7 10*3/uL (ref 1.7–7.7)
Neutrophils Relative %: 64 %
Platelets: 237 10*3/uL (ref 150–400)
RBC: 3.43 MIL/uL — ABNORMAL LOW (ref 3.87–5.11)
RDW: 16.8 % — ABNORMAL HIGH (ref 11.5–15.5)
WBC: 5.7 10*3/uL (ref 4.0–10.5)
nRBC: 0 % (ref 0.0–0.2)

## 2020-02-11 LAB — COMPREHENSIVE METABOLIC PANEL
ALT: 24 U/L (ref 0–44)
AST: 44 U/L — ABNORMAL HIGH (ref 15–41)
Albumin: 2.8 g/dL — ABNORMAL LOW (ref 3.5–5.0)
Alkaline Phosphatase: 79 U/L (ref 38–126)
Anion gap: 12 (ref 5–15)
BUN: 18 mg/dL (ref 8–23)
CO2: 25 mmol/L (ref 22–32)
Calcium: 8.9 mg/dL (ref 8.9–10.3)
Chloride: 102 mmol/L (ref 98–111)
Creatinine, Ser: 1.19 mg/dL — ABNORMAL HIGH (ref 0.44–1.00)
GFR calc Af Amer: 56 mL/min — ABNORMAL LOW (ref >60)
GFR calc non Af Amer: 48 mL/min — ABNORMAL LOW (ref >60)
Glucose, Bld: 93 mg/dL (ref 70–99)
Potassium: 3.4 mmol/L — ABNORMAL LOW (ref 3.5–5.1)
Sodium: 139 mmol/L (ref 135–145)
Total Bilirubin: 0.4 mg/dL (ref 0.3–1.2)
Total Protein: 6.6 g/dL (ref 6.5–8.1)

## 2020-02-11 LAB — TSH: TSH: 0.348 u[IU]/mL — ABNORMAL LOW (ref 0.350–4.500)

## 2020-02-11 LAB — BLOOD GAS, VENOUS
Acid-Base Excess: 1.5 mmol/L (ref 0.0–2.0)
Bicarbonate: 25.4 mmol/L (ref 20.0–28.0)
O2 Saturation: 94.6 %
Patient temperature: 98.6
pCO2, Ven: 39.2 mmHg — ABNORMAL LOW (ref 44.0–60.0)
pH, Ven: 7.427 (ref 7.250–7.430)
pO2, Ven: 78.9 mmHg — ABNORMAL HIGH (ref 32.0–45.0)

## 2020-02-11 LAB — MAGNESIUM: Magnesium: 1.4 mg/dL — ABNORMAL LOW (ref 1.7–2.4)

## 2020-02-11 MED ORDER — POTASSIUM CHLORIDE CRYS ER 20 MEQ PO TBCR
30.0000 meq | EXTENDED_RELEASE_TABLET | Freq: Once | ORAL | Status: AC
  Administered 2020-02-11: 30 meq via ORAL
  Filled 2020-02-11: qty 1

## 2020-02-11 MED ORDER — ONDANSETRON HCL 4 MG/2ML IJ SOLN: 4.0000 mg | Freq: Four times a day (QID) | INTRAMUSCULAR | Status: DC | PRN

## 2020-02-11 MED ORDER — SODIUM CHLORIDE 0.9% FLUSH
10.0000 mL | INTRAVENOUS | Status: DC | PRN
  Administered 2020-02-14 (×2): 10 mL

## 2020-02-11 MED ORDER — SODIUM CHLORIDE 0.9% FLUSH
3.0000 mL | Freq: Two times a day (BID) | INTRAVENOUS | Status: DC
  Administered 2020-02-12 – 2020-02-13 (×3): 3 mL via INTRAVENOUS

## 2020-02-11 MED ORDER — CHLORHEXIDINE GLUCONATE CLOTH 2 % EX PADS
6.0000 | MEDICATED_PAD | Freq: Every day | CUTANEOUS | Status: DC
  Administered 2020-02-11 – 2020-02-14 (×4): 6 via TOPICAL

## 2020-02-11 MED ORDER — ENOXAPARIN SODIUM 40 MG/0.4ML ~~LOC~~ SOLN
40.0000 mg | Freq: Every day | SUBCUTANEOUS | Status: DC
  Administered 2020-02-12 – 2020-02-13 (×3): 40 mg via SUBCUTANEOUS
  Filled 2020-02-11 (×3): qty 0.4

## 2020-02-11 MED ORDER — ALLOPURINOL 300 MG PO TABS
300.0000 mg | ORAL_TABLET | Freq: Every day | ORAL | Status: DC
  Administered 2020-02-11 – 2020-02-14 (×4): 300 mg via ORAL
  Filled 2020-02-11 (×4): qty 1

## 2020-02-11 MED ORDER — TUCATINIB 150 MG PO TABS: 150.0000 mg | ORAL_TABLET | Freq: Every morning | ORAL | Status: DC

## 2020-02-11 MED ORDER — POTASSIUM CHLORIDE CRYS ER 20 MEQ PO TBCR
20.0000 meq | EXTENDED_RELEASE_TABLET | Freq: Every day | ORAL | Status: DC
  Administered 2020-02-11 – 2020-02-14 (×4): 20 meq via ORAL
  Filled 2020-02-11 (×4): qty 1

## 2020-02-11 MED ORDER — POTASSIUM CHLORIDE 10 MEQ/100ML IV SOLN
10.0000 meq | INTRAVENOUS | Status: AC
  Administered 2020-02-11 (×2): 10 meq via INTRAVENOUS
  Filled 2020-02-11 (×2): qty 100

## 2020-02-11 MED ORDER — SODIUM CHLORIDE 0.9 % IV SOLN
1.0000 g | Freq: Every day | INTRAVENOUS | Status: DC
  Administered 2020-02-11 – 2020-02-13 (×4): 1 g via INTRAVENOUS
  Filled 2020-02-11 (×4): qty 1

## 2020-02-11 MED ORDER — KETOROLAC TROMETHAMINE 30 MG/ML IJ SOLN
30.0000 mg | Freq: Once | INTRAMUSCULAR | Status: AC
  Administered 2020-02-11: 30 mg via INTRAVENOUS
  Filled 2020-02-11: qty 1

## 2020-02-11 MED ORDER — PANTOPRAZOLE SODIUM 40 MG PO TBEC
40.0000 mg | DELAYED_RELEASE_TABLET | Freq: Every day | ORAL | Status: DC
  Administered 2020-02-11 – 2020-02-14 (×4): 40 mg via ORAL
  Filled 2020-02-11 (×4): qty 1

## 2020-02-11 MED ORDER — MAGNESIUM SULFATE 2 GM/50ML IV SOLN
2.0000 g | Freq: Once | INTRAVENOUS | Status: AC
  Administered 2020-02-11: 2 g via INTRAVENOUS
  Filled 2020-02-11: qty 50

## 2020-02-11 MED ORDER — ADULT MULTIVITAMIN W/MINERALS CH
1.0000 | ORAL_TABLET | Freq: Every day | ORAL | Status: DC
  Administered 2020-02-11 – 2020-02-14 (×4): 1 via ORAL
  Filled 2020-02-11 (×4): qty 1

## 2020-02-11 MED ORDER — SERTRALINE HCL 100 MG PO TABS
100.0000 mg | ORAL_TABLET | Freq: Every day | ORAL | Status: DC
  Administered 2020-02-11 – 2020-02-14 (×4): 100 mg via ORAL
  Filled 2020-02-11 (×4): qty 1

## 2020-02-11 MED FILL — TUKYSA 150 MG TABS: 150 | 30 days supply | Qty: 60 | Fill #2

## 2020-02-11 NOTE — H&P (Signed)
History and Physical    PLEASE NOTE THAT DRAGON DICTATION SOFTWARE WAS USED IN THE CONSTRUCTION OF THIS NOTE.   [name] DOB: 1956/02/11 DOA: 02/10/2020  PCP: Chauncey Cruel, MD Patient coming from: home   I have personally briefly reviewed patient's old medical records in [place]  Chief Complaint: Generalized weakness  HPI: [name] is a 64 y.o. female with medical history significant for breast cancer with metastasis to the brain, hypertension, who is admitted to [facility] on 02/10/2020 with urinary tract infection after presenting from home to [facility] Emergency Department complaining of generalized weakness.   The patient reports 1 to 2 days of generalized weakness in the absence of any acute focal neurologic deficits, including now acute focal weakness or change in sensation.  In the setting of this generalized weakness, the patient notes increased difficulty with ambulation as well as increased difficulty in performing her ADLs.  Denies any associated new headache, change in vision, dizziness, vertigo, dysphagia, dysarthria, facial droop.  She does however report mild dysuria over the last 2 days, in the absence of any gross hematuria.  Denies any subjective fever, chills, rigors, or generalized myalgias. Denies any recent headache, neck stiffness, rhinitis, rhinorrhea, sore throat, sob, cough, nausea, vomiting, abdominal pain, diarrhea, or rash. No recent traveling or known COVID-19 exposures. Denies any recent chest pain, diaphoresis, or palpitations.  The emergency department physician also discussed the patient's case with the patient's daughter, who conveyed her finding that the patient has exhibited some increased somnolence over the course of the last day, sleeping more relative to her baseline sleep requirements.  No known recent trauma.   Of note, the patient underwent COVID-19 testing in January, with a positive COVID-19  antigen test on 11/19/2019.    ED Course:  Vital signs in the ED were notable for the following: Temperature max 99.7; heart rate 91-95; blood pressure 107/75 - 134/92; respiratory rate 19-26; oxygen saturation 90 to 93% on room air.  Labs were notable for the following: CMP notable for the following: Sodium 136, potassium 3.2, bicarbonate 25, anion gap 10, creatinine 0.9, glucose 99.  CBC notable for white blood cell count of 7500 with 73% neutrophils.  Urinalysis was associate with a cloudy specimen, greater than 50 white blood cells, many bacteria, moderate leukocyte Estrace, and no evidence of squamous epithelial cells.  Urinary drug screen is positive for benzodiazepines, opioids, and cannabis.  Chest x-ray showed no evidence of acute cardiopulmonary process, including no evidence of infiltrate, edema, effusion, or pneumothorax.  Noncontrast CT of the head to evaluate patient's daughter's report of increased somnolence, showed no evidence of acute intracranial hemorrhage.  CT head also showed areas of white matter hypodensity involving the frontal and right temporal lobes similar to that seen on most recent prior CT head from 01/16/2020.  While in the ED, the following were administered: Potassium chloride 10 mEq IV over 2 hours x 1.  Subsequently, the patient was admitted for further evaluation management of presenting urinary tract infection.     Review of Systems: As per HPI otherwise 10 point review of systems negative.   Past Medical History:  Diagnosis Date   Breast cancer ([place])    DVT (deep venous thrombosis) ([place]) 10/07/2011   Fever 02/06/2018   Hypertension    Radiation 11/29/2005-12/29/2005   right supraclavicular area 4147 cGy   Radiation 06/26/02-08/14/02   right breast 5040 cGy, tumor bed boosted to 1260 cGy   Rheumatic  fever     Past Surgical History:  Procedure Laterality Date   BREAST SURGERY     MASTECTOMY Bilateral    PORT A CATH REVISION      Social  History:  reports that she has quit smoking. She has never used smokeless tobacco. She reports current alcohol use. She reports that she does not use drugs.   Allergies  Allergen Reactions   Tramadol Nausea Only   Hydrocodone-Acetaminophen Itching and Rash   Pork-Derived Products Other (See Comments)    Pt says she just doesn't eat pork; has no reaction to pork products    Family History  Problem Relation Age of Onset   Pancreatic cancer Mother    Kidney cancer Sister 62   Breast cancer Sister 109   Breast cancer Other 86     Prior to Admission medications   Medication Sig Start Date End Date Taking? Authorizing Provider  allopurinol (ZYLOPRIM) 300 MG tablet Take 300 mg by mouth daily. 01/20/20  Yes [provider]  ALPRAZolam Duanne Moron) 1 MG tablet Take 1 tablet (1 mg total) by mouth at bedtime as needed for sleep. 01/13/20  Yes Truitt Merle, MD  Ascorbic Acid (VITAMIN C PO) Take 1 tablet by mouth daily.   Yes [provider]  cholecalciferol (VITAMIN D3) 25 MCG (1000 UNIT) tablet Take 1,000 Units by mouth daily.   Yes [provider]  furosemide (LASIX) 20 MG tablet Take 1 tablet (20 mg total) by mouth daily. 01/21/20  Yes Sherwood Gambler, MD  gabapentin (NEURONTIN) 100 MG capsule Take 100 mg by mouth 3 (three) times daily.   Yes [provider]  KLOR-CON M20 20 MEQ tablet Take 20 mEq by mouth daily. 01/15/20  Yes [provider]  magic mouthwash SOLN Take 10 mLs by mouth 4 (four) times daily as needed for mouth pain. Swish and spit.  10/18/19  Yes [provider]  methadone (DOLOPHINE) 10 MG tablet Take 10 mg by mouth. Bottle states 1 tab 5 times a day with fill date of 01/11/2020 per Dr Mirna Mires   Yes [provider]  Multiple Vitamin (MULTIVITAMIN) capsule Take 1 capsule by mouth daily.     Yes [provider]  omeprazole (PRILOSEC) 40 MG capsule Take 1 capsule (40 mg total) by mouth daily. 02/04/20  Yes [name],  [name], MD  ondansetron (ZOFRAN) 4 MG tablet TAKE 1 TABLET BY MOUTH THREE TIMES A DAY AS NEEDED Patient taking differently: Take 4 mg by mouth every 8 (eight) hours as needed for nausea or vomiting.  01/23/20  Yes [name], [name], MD  Oxycodone HCl 20 MG TABS Take 1 tablet (20 mg total) by mouth 3 (three) times daily as needed (pain). 01/12/20  Yes Hayden Rasmussen, MD  prochlorperazine (COMPAZINE) 10 MG tablet Take 1 tablet (10 mg total) by mouth every 6 (six) hours as needed for nausea or vomiting. 01/13/20  Yes Truitt Merle, MD  sertraline (ZOLOFT) 100 MG tablet TAKE 1 TABLET BY MOUTH EVERY DAY 12/23/19  Yes [name], [name], MD  triamterene-hydrochlorothiazide (DYAZIDE) 37.5-25 MG capsule Take 1 each (1 capsule total) by mouth every morning. 07/30/19  Yes [name], [name], MD  tucatinib (TUKYSA) 150 MG tablet Take 1 tablet (150 mg total) by mouth every morning. Take as directed by MD. 12/18/19  Yes [name], [name], MD  cholecalciferol (VITAMIN D) 1000 units tablet Take 1 tablet (1,000 Units total) by mouth daily. Patient not taking: Reported on 11/19/2019 05/16/18  [name], [name], MD  naloxone [facility]) nasal spray 4 mg/0.1 mL Administer single spray of naloxone into one nostril. Additionally, spray can be given every 2-3 minutes as needed until emergency medical assistance arrives. Call 911 immediately after use. Patient not taking: Reported on 01/15/2020 10/25/19   [name], [name], MD  polyethylene glycol (MIRALAX / GLYCOLAX) 17 g packet Take 17 g by mouth daily as needed for mild constipation. Patient not taking: Reported on 01/15/2020 11/21/19   Mercy Riding, MD  senna-docusate (SENOKOT-S) 8.6-50 MG tablet Take 1 tablet by mouth 2 (two) times daily as needed for moderate constipation. Patient not taking: Reported on 01/15/2020 11/21/19   Mercy Riding, MD     Objective    Physical Exam: Vitals:   02/10/20 1900 02/10/20 2100 02/10/20 2200 02/10/20 2230  BP: 131/74 124/80 112/72  107/75  Pulse: 91 96 94 93  Resp: (!) 25 (!) 28 17 (!) 22  Temp:      TempSrc:      SpO2: 90% 90% 93% 92%    General: appears to be stated age; sleeping, but easily rouses with verbal stimuli; alert, oriented Skin: warm, dry, no rash Head:  AT/[place] Eyes:  PEARL b/l, EOMI Mouth:  Oral mucosa membranes appear moist, normal dentition Neck: supple; trachea midline Heart:  RRR; did not appreciate any M/R/G Lungs: CTAB, did not appreciate any wheezes, rales, or rhonchi Abdomen: + BS; soft, ND, NT Vascular: 2+ pedal pulses b/l; 2+ radial pulses b/l Extremities: no peripheral edema, no muscle wasting Neuro: strength and sensation intact in upper and lower extremities b/l   Labs on Admission: I have personally reviewed following labs and imaging studies  CBC: Recent Labs  Lab 02/10/20 1552  WBC 7.5  NEUTROABS 5.6  HGB 10.4*  HCT 33.3*  MCV 91.7  PLT 725   Basic Metabolic Panel: Recent Labs  Lab 02/10/20 1552  NA 136  K 3.2*  CL 101  CO2 25  GLUCOSE 99  BUN 16  CREATININE 0.90  CALCIUM 8.9   GFR: Estimated Creatinine Clearance: 71.3 mL/min (by C-G formula based on SCr of 0.9 mg/dL). Liver Function Tests: Recent Labs  Lab 02/10/20 1552  AST 51*  ALT 26  ALKPHOS 89  BILITOT 0.8  PROT 7.1  ALBUMIN 3.0*   No results for input(s): LIPASE, AMYLASE in the last 168 hours. No results for input(s): AMMONIA in the last 168 hours. Coagulation Profile: No results for input(s): INR, PROTIME in the last 168 hours. Cardiac Enzymes: No results for input(s): CKTOTAL, CKMB, CKMBINDEX, TROPONINI in the last 168 hours. BNP (last 3 results) No results for input(s): PROBNP in the last 8760 hours. HbA1C: No results for input(s): HGBA1C in the last 72 hours. CBG: No results for input(s): GLUCAP in the last 168 hours. Lipid Profile: No results for input(s): CHOL, HDL, LDLCALC, TRIG, CHOLHDL, LDLDIRECT in the last 72 hours. Thyroid Function Tests: No results for input(s): TSH,  T4TOTAL, FREET4, T3FREE, THYROIDAB in the last 72 hours. Anemia Panel: No results for input(s): VITAMINB12, FOLATE, FERRITIN, TIBC, IRON, RETICCTPCT in the last 72 hours. Urine analysis:    Component Value Date/Time   COLORURINE AMBER (A) 02/10/2020 2135   APPEARANCEUR CLOUDY (A) 02/10/2020 2135   LABSPEC 1.019 02/10/2020 2135   PHURINE 6.0 02/10/2020 2135   GLUCOSEU NEGATIVE 02/10/2020 2135   HGBUR NEGATIVE 02/10/2020 2135   BILIRUBINUR NEGATIVE 02/10/2020 2135   [place] NEGATIVE 02/10/2020 2135   PROTEINUR 100 (A) 02/10/2020 2135  UROBILINOGEN 0.2 12/29/2010 1427   NITRITE NEGATIVE 02/10/2020 2135   LEUKOCYTESUR MODERATE (A) 02/10/2020 2135    Radiological Exams on Admission: DG Chest 2 View  Result Date: 02/10/2020 CLINICAL DATA:  Altered mental status. EXAM: CHEST - 2 VIEW COMPARISON:  January 25, 2020 FINDINGS: There is a well-positioned left-sided Port-A-Cath. The heart size is stable. Areas of scarring and atelectasis are noted bilaterally. There is no pneumothorax. There are degenerative changes throughout the visualized thoracic spine. IMPRESSION: No active cardiopulmonary disease. Electronically Signed   By: Constance Holster [name]   On: 02/10/2020 23:19   CT Head Wo Contrast  Result Date: 02/10/2020 CLINICAL DATA:  64 year old female with headache. History of metastatic breast cancer. EXAM: CT HEAD WITHOUT CONTRAST TECHNIQUE: Contiguous axial images were obtained from the base of the skull through the vertex without intravenous contrast. COMPARISON:  Head CT dated 01/16/2020. FINDINGS: Brain: The ventricles and sulci appropriate size for patient's age. Areas of white matter hypodensity involving the inferior right frontal and right temporal lobes similar to prior CT in keeping with metastatic disease. There is no acute intracranial hemorrhage. No mass effect or midline shift. No extra-axial fluid collection. Vascular: No hyperdense vessel or unexpected calcification. Skull:  Normal. Negative for fracture or focal lesion. Sinuses/Orbits: The visualized paranasal sinuses and the right mastoid air cells are clear. Mild left mastoid effusions. No air-fluid level. Other: None IMPRESSION: 1. No acute intracranial hemorrhage. 2. Similar appearance of right frontal and temporal white matter hypodensity in keeping with metastatic disease. Electronically Signed   By: Anner Crete [name]   On: 02/10/2020 23:10     Assessment/Plan   [name] is a 64 y.o. female with medical history significant for breast cancer with metastasis to the brain, hypertension, who is admitted to [facility] on 02/10/2020 with urinary tract infection after presenting from home to [facility] Emergency Department complaining of generalized weakness.    Principal Problem:   UTI (urinary tract infection) Active Problems:   Essential hypertension   GERD   Acute metabolic encephalopathy   Generalized weakness   Hypokalemia   #) Urinary tract infection: Diagnosis on the basis of presenting 1 to 2 days of dysuria associated with generalized weakness and increased somnolence, with presenting urinalysis suggestive of underlying UTI.  In the absence of fever or leukocytosis, SIRS criteria not met for presentation to be considered sepsis.   Plan: Check blood cultures x2 now, following which will start empiric antibiotic coverage for acute cystitis via initiation of IV Rocephin.  Follow for results of urine culture collected in the ED this evening.  Repeat CBC with differential in the morning.      #) Generalized weakness: 1 to 2 days of generalized, nonfocal weakness inhibiting patient's ambulatory status as well as ability to perform ADLs.  Does not appear to be associate with any acute focal neurologic deficits.  Suspect that this is on the basis of urinary tract infection identified at the time of this evening's presentation, so the described above.  There may also be more chronic  contributing factors in the setting of known metastatic breast cancer, as further described above, as well as some potential pharmacologic contributions given the presence of multiple central acting medications at home, as further described below.  Plan: Work-up and management of urinary tract infection, as further described above.  I placed a physical therapy consult to take place in the morning (02/11/20).  Check reflex TSH.     #) Acute metabolic  encephalopathy: Increase in somnolence over the course today, as further described above.  Suspect that this is on the basis of physiologic stress stemming from presenting UTI, as above, with potential additional pharmacologic contributions from polypharmacy as well as the presence of multiple central acting medications at home, including as needed Xanax, gabapentin, methadone, as needed oxycodone, and Zoloft.  Should treatment of underlying urinary tract infection as well as holding of some of outpatient central acting medications fail to be associated with resolution of recent somnolence, could consider obtaining MRI of the brain, particular in the setting of known metastatic breast cancer to the brain.  Of note, presenting urinalysis positive for benzodiazepines as well as opioids, both of which the patient is prescribed as an outpatient, in addition to a positive for cannabis.  Plan: Work-up and management of presenting urinary tract infection, as above.  An effort to reduce potential pharmacologic confounding variables via multiple central acting medications at home, will hold the following medications for now and closely monitor for trending of subsequent mental status: As needed Xanax, gabapentin, as needed oxycodone. Will also hold home scheduled methadone overnight, as the long-acting nature of this medication should prevent any interval withdrawal.  Check TSH, VBG.     #) Recent Covid 19 infection: As it has been greater than 21 days but within 3  months since the patient's positive COVID-19 test on 11/19/2019, there is currently no indication to repeat COVID-19 testing or to maintain Airborne/Contact precautions, and I will be admitting this patient to a non-COVID unit.  The patient denies any current respiratory symptoms and does not appear to be in respiratory distress.  Plan: will refrain from airborne/contact precautions, as further described above.      #) Hypokalemia: Presenting labs reflect serum potassium of 3.2.  The patient has received a total of 20 mEq of IV potassium in the ED this evening.  Plan: Add on serum magnesium level labs already collected in the ED.  Repeat BMP in the morning.  Monitor on telemetry.     #) Metastatic breast cancer: Documented history of metastatic disease to the brain.  On the antineoplastic agent Tucatinib.   Plan: Continue outpatient Tucatinib.     #) Essential hypertension: Outpatient antihypertensive regimen consists of triamterene, HCTZ.  Systolic blood pressures in the ED have ranged from 107 - 134 mmHg.    Plan: In the setting of presenting infectious presentation, will hold blood pressure medications for now, and closely monitor ensuing blood pressure via routine vital signs.     #) GERD: On omeprazole as an outpatient.  Plan: Continue home PPI.    DVT prophylaxis: scd's  Code Status: Full code Family Communication: none Disposition Plan: Per Rounding Team Consults called: none  Admission status: inpatient; med-tele.    PLEASE NOTE THAT DRAGON DICTATION SOFTWARE WAS USED IN THE CONSTRUCTION OF THIS NOTE.   [place] [facility] Pager [phone] From [place].   Otherwise, please contact night-coverage  [email or web address] Password [facility]  02/11/2020, 12:17 AM

## 2020-02-11 NOTE — Evaluation (Signed)
Physical Therapy Evaluation Patient Details Name: [name] MRN: [id number] DOB: [birth year] Today's Date: 02/11/2020   History of Present Illness  64 yo female admitted with UTI, weakness, fall at home. Hx of met breast cancer s/[name] R mastectomy, on radiation, chronic pain, DVT, COVID, polyneuropathy  Clinical Impression  On eval, pt required Min assist for mobility. She walked ~45 feet with a RW. Pt presents with general weakness, decreased activity tolerance, and impaired gait and balance. Discussed d/c plan-pt stated she prefers to return home. Unsure if she has appropriate assistance available at home. She seems mildly confused and has poor insight at times. Will continue to follow and progress activity as tolerated.     Follow Up Recommendations Home health PT;Supervision/Assistance - 24 hour;SNF(depending on progress/pt decision)    Equipment Recommendations  Rolling walker with 5" wheels    Recommendations for Other Services       Precautions / Restrictions Precautions Precautions: Fall Restrictions Weight Bearing Restrictions: No      Mobility  Bed Mobility Overal bed mobility: Needs Assistance Bed Mobility: Supine to Sit     Supine to sit: Min guard;HOB elevated     General bed mobility comments: Reliance on bedrail. Increased time.  Transfers Overall transfer level: Needs assistance Equipment used: Rolling walker (2 wheeled) Transfers: Sit to/from Stand Sit to Stand: Min assist;From elevated surface         General transfer comment: Assist to rise, stabilize, control descent. VCs safety, technique, hand placement.  Ambulation/Gait Ambulation/Gait assistance: Min assist Gait Distance (Feet): 45 Feet Assistive device: Rolling walker (2 wheeled) Gait Pattern/deviations: Step-through pattern;Decreased stride length;Shuffle;Decreased step length - right;Decreased step length - left     General Gait Details: Assist to stabilize pt througout distance.  Distance limited by pain, fatigue.  Stairs            Wheelchair Mobility    Modified Rankin (Stroke Patients Only)       Balance Overall balance assessment: Needs assistance         Standing balance support: Bilateral upper extremity supported Standing balance-Leahy Scale: Poor                               Pertinent Vitals/Pain Pain Assessment: Faces Faces Pain Scale: Hurts even more Pain Location: back Pain Descriptors / Indicators: Discomfort;Sore Pain Intervention(s): Monitored during session;Limited activity within patient's tolerance;Repositioned    Home Living Family/patient expects to be discharged to:: Private residence Living Arrangements: Spouse/significant other(works-away from home for long periods sometimes) Available Help at Discharge: Family;Available PRN/intermittently Type of Home: House Home Access: Stairs to enter   [employer] of Steps: 2 Home Layout: One level Home Equipment: Shower seat;Cane - single point      Prior Function Level of Independence: Independent with assistive device(s)               Hand Dominance        Extremity/Trunk Assessment   Upper Extremity Assessment Upper Extremity Assessment: Defer to OT evaluation    Lower Extremity Assessment Lower Extremity Assessment: Generalized weakness    Cervical / Trunk Assessment Cervical / Trunk Assessment: Normal  Communication      Cognition Arousal/Alertness: Awake/alert Behavior During Therapy: WFL for tasks assessed/performed Overall Cognitive Status: Within Functional Limits for tasks assessed  General Comments: some poor insight at times      General Comments      Exercises     Assessment/Plan    PT Assessment Patient needs continued PT services  PT Problem List Decreased strength;Decreased mobility;Decreased activity tolerance;Decreased balance;Decreased knowledge of use of  DME;Pain;Decreased cognition;Decreased safety awareness       PT Treatment Interventions DME instruction;Therapeutic activities;Gait training;Therapeutic exercise;Patient/family education;Balance training;Functional mobility training    PT Goals (Current goals can be found in the Care Plan section)  Acute Rehab PT Goals Patient Stated Goal: to be able to walk again (without device) PT Goal Formulation: With patient Time For Goal Achievement: 02/25/20 Potential to Achieve Goals: Good    Frequency Min 3X/week   Barriers to discharge Decreased caregiver support      Co-evaluation               AM-PAC PT "6 Clicks" Mobility  Outcome Measure Help needed turning from your back to your side while in a flat bed without using bedrails?: A Little Help needed moving from lying on your back to sitting on the side of a flat bed without using bedrails?: A Little Help needed moving to and from a bed to a chair (including a wheelchair)?: A Little Help needed standing up from a chair using your arms (e.g., wheelchair or bedside chair)?: A Little Help needed to walk in hospital room?: A Little Help needed climbing 3-5 steps with a railing? : A Little 6 Click Score: 18    End of Session Equipment Utilized During Treatment: Gait belt Activity Tolerance: Patient limited by fatigue;Patient limited by pain Patient left: in chair;with call bell/phone within reach;with chair alarm set   PT Visit Diagnosis: Muscle weakness (generalized) (M62.81);Difficulty in walking, not elsewhere classified (R26.2);Unsteadiness on feet (R26.81)    Time: 1100-1115 PT Time Calculation (min) (ACUTE ONLY): 15 min   Charges:   PT Evaluation $PT Eval Moderate Complexity: 1 Mod            [place] [name], PT Acute Rehabilitation

## 2020-02-11 NOTE — Progress Notes (Signed)
HEMATOLOGY-ONCOLOGY PROGRESS NOTE  SUBJECTIVE: [name] presented to the emergency room with generalized weakness.  Labs on admission notable for a hemoglobin of 10.4, potassium 3.2, albumin 3.0, and AST 51.  Urinalysis consistent with UTI, urine culture pending.  Blood cultures pending.  The patient is currently receiving systemic treatment for her metastatic breast cancer with Pertuzumab, trastuzumab, Abraxane, and tucatinib. Tucatinib has been placed on hold per pharmacy protocol due to active infection.  Pain medications are currently being prescribed by Dr. Mirna Mires on outpatient basis.  Today, [name] continues to complain of lower extremity weakness and pain to her knees and elbows.  She has not had any recent fevers or chills.  She has no other complaints this morning.  Oncology History  Malignant neoplasm of upper-inner quadrant of right breast in female, estrogen receptor positive ([place])  10/03/2013 Initial Diagnosis   Malignant neoplasm of upper-inner quadrant of right breast in female, estrogen receptor positive ([place])   06/07/2016 -  Chemotherapy   The patient had trastuzumab (HERCEPTIN) 651 mg in sodium chloride 0.9 % 250 mL chemo infusion, 8 mg/kg = 651 mg, Intravenous,  Once, 35 of 35 cycles Dose modification: 440 mg (original dose 6 mg/kg, Cycle 7, Reason: Provider Judgment), 4 mg/kg (original dose 6 mg/kg, Cycle 10, Reason: Provider Judgment), 6 mg/kg (original dose 6 mg/kg, Cycle 17, Reason: Other (see comments), Comment: MD confirmed dose), 4 mg/kg (original dose 6 mg/kg, Cycle 21, Reason: Provider Judgment), 6 mg/kg (original dose 6 mg/kg, Cycle 26, Reason: Other (see comments), Comment: reentered for scanning issues), 6 mg/kg (original dose 6 mg/kg, Cycle 27, Reason: Other (see comments), Comment: reentered to use current herceptin vial of '150mg'$ ) Administration: 651 mg (06/07/2016), 483 mg (06/28/2016), 483 mg (01/03/2017), 483 mg (08/30/2016), 483 mg (10/11/2016), 440 mg (02/23/2017), 450 mg  (03/16/2017), 450 mg (04/06/2017), 450 mg (04/27/2017), 300 mg (05/30/2017), 450 mg (07/04/2017), 450 mg (08/15/2017), 450 mg (09/05/2017), 450 mg (09/27/2017), 525 mg (11/29/2017), 525 mg (12/27/2017), 525 mg (01/24/2018), 525 mg (03/21/2018), 525 mg (04/18/2018), 336 mg (05/24/2018), 525 mg (06/13/2018), 525 mg (07/14/2018), 525 mg (08/14/2018), 525 mg (09/11/2018), 525 mg (10/23/2018), 525 mg (11/21/2018), 525 mg (12/19/2018), 525 mg (01/16/2019), 525 mg (02/13/2019), 525 mg (03/12/2019), 525 mg (04/09/2019), 525 mg (05/07/2019), 525 mg (06/04/2019), 525 mg (07/02/2019) pertuzumab (PERJETA) 840 mg in sodium chloride 0.9 % 250 mL chemo infusion, 840 mg, Intravenous, Once, 31 of 33 cycles Dose modification: 420 mg (original dose 420 mg, Cycle 8), 420 mg (original dose 420 mg, Cycle 14), 420 mg (original dose 420 mg, Cycle 18) Administration: 840 mg (06/07/2016), 420 mg (06/28/2016), 420 mg (04/06/2017), 420 mg (04/27/2017), 420 mg (07/04/2017), 420 mg (09/27/2017), 420 mg (11/09/2017), 420 mg (11/29/2017), 420 mg (12/27/2017), 420 mg (03/21/2018), 420 mg (04/18/2018), 420 mg (06/13/2018), 420 mg (07/14/2018), 420 mg (08/14/2018), 420 mg (09/11/2018), 420 mg (01/24/2018), 420 mg (10/23/2018), 420 mg (11/21/2018), 420 mg (12/19/2018), 420 mg (01/16/2019), 420 mg (02/13/2019), 420 mg (03/12/2019), 420 mg (04/09/2019), 420 mg (05/07/2019), 420 mg (06/04/2019), 420 mg (07/02/2019), 420 mg (07/30/2019), 420 mg (10/18/2019), 420 mg (12/18/2019), 420 mg (01/15/2020) trastuzumab-anns (KANJINTI) 525 mg in sodium chloride 0.9 % 250 mL chemo infusion, 6 mg/kg = 525 mg (100 % of original dose 6 mg/kg), Intravenous,  Once, 4 of 6 cycles Dose modification: 6 mg/kg (original dose 6 mg/kg, Cycle 36, Reason: Other (see comments)) Administration: 525 mg (07/30/2019), 525 mg (10/18/2019), 525 mg (12/18/2019), 525 mg (01/15/2020)  for chemotherapy treatment.    09/11/2018 -  Chemotherapy  The patient had PACLitaxel-protein bound (ABRAXANE) chemo infusion 200 mg, 100 mg/m2 = 200 mg (100 % of  original dose 100 mg/m2), Intravenous,  Once, 8 of 11 cycles Dose modification: 100 mg/m2 (original dose 100 mg/m2, Cycle 7) Administration: 200 mg (04/09/2019), 200 mg (05/07/2019), 200 mg (06/04/2019), 200 mg (07/02/2019), 200 mg (07/30/2019), 200 mg (10/18/2019), 200 mg (12/18/2019), 200 mg (01/15/2020) PACLitaxel-protein bound (ABRAXANE) chemo infusion 200 mg, 100 mg/m2 = 200 mg, Intravenous, Once, 6 of 6 cycles Administration: 200 mg (10/23/2018), 200 mg (11/21/2018), 200 mg (12/19/2018), 200 mg (01/16/2019), 200 mg (02/13/2019), 200 mg (03/12/2019)  for chemotherapy treatment.    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       REVIEW OF SYSTEMS:   Noncontributory except as noted in the HPI.  I have reviewed the past medical history, past surgical history, social history and family history with the patient and they are unchanged from previous note.   PHYSICAL EXAMINATION: ECOG PERFORMANCE STATUS: 2 - Symptomatic, <50% confined to bed  Vitals:   02/11/20 0604 02/11/20 1018  BP: 104/68 108/71  Pulse: 90 84  Resp: 20 20  Temp: 98.7 F (37.1 C) 98 F (36.7 C)  SpO2: 92% 91%   Filed Weights   02/11/20 0246  Weight: 89 kg    Intake/Output from previous day: 04/05 0701 - 04/06 0700 In: 315.8 [IV Piggyback:315.8] Out: -   GENERAL:alert,  no distress and comfortable OROPHARYNX:no exudate, no erythema and lips, buccal mucosa, and tongue normal  LUNGS: clear to auscultation and percussion with normal breathing effort HEART: regular rate & rhythm and no murmurs and no lower extremity edema ABDOMEN:abdomen soft, non-tender and normal bowel sounds Musculoskeletal:no cyanosis of digits and no clubbing  NEURO: alert & oriented x 3 with fluent speech, no focal motor/sensory deficits BREASTS: Deferred.   LABORATORY DATA:  I have reviewed the data as listed CMP Latest Ref Rng & Units 02/11/2020 02/10/2020 01/21/2020  Glucose 70 - 99 mg/dL 93 99 140(H)  BUN 8 - 23 mg/dL '18 16 16  '$ Creatinine 0.44 - 1.00 mg/dL 1.19(H) 0.90 0.83  Sodium 135 - 145 mmol/L 139 136 135  Potassium 3.5 - 5.1 mmol/L 3.4(L) 3.2(L) 4.5  Chloride 98 - 111 mmol/L 102 101 99  CO2 22 - 32 mmol/L '25 25 25  '$ Calcium 8.9 - 10.3 mg/dL 8.9 8.9 9.0  Total Protein 6.5 - 8.1 g/dL 6.6 7.1 7.0  Total Bilirubin 0.3 - 1.2 mg/dL 0.4 0.8 0.6  Alkaline Phos 38 - 126 U/L 79 89 96  AST 15 - 41 U/L 44(H) 51(H) 44(H)  ALT 0 - 44 U/L 24 26 43    Lab Results  Component Value Date   WBC 5.7 02/11/2020   HGB 9.9 (L) 02/11/2020   HCT 31.5 (L) 02/11/2020   MCV 91.8 02/11/2020   PLT 237 02/11/2020   NEUTROABS 3.7 02/11/2020  DG Chest 2 View  Result Date: 02/10/2020 CLINICAL DATA:  Altered mental status. EXAM: CHEST - 2 VIEW COMPARISON:  January 25, 2020 FINDINGS: There is a well-positioned left-sided Port-A-Cath. The heart size is stable. Areas of scarring and atelectasis are noted bilaterally. There is no pneumothorax. There are degenerative changes throughout the visualized thoracic spine. IMPRESSION: No active cardiopulmonary disease. Electronically Signed   By: Constance Holster [name]   On: 02/10/2020 23:19   DG Chest 2 View  Result Date: 01/25/2020 CLINICAL DATA:  Cough and shortness of breath. History of breast cancer. Patient is unsteady on her feet. Patient has difficulty  holding still. EXAM: CHEST - 2 VIEW COMPARISON:  01/21/2020 FINDINGS: Patient's LEFT-sided power port, tip to the superior vena cava. Heart is UPPER limits normal. There are patchy infiltrates in the lungs persist, but there has been some improvement in aeration accounting for differences in technique. No pleural effusions. RIGHT axillary node dissection. Patient motion artifact. IMPRESSION: Persistent but improved bilateral infiltrates. Electronically Signed   By: Nolon Nations [name]   On: 01/25/2020 16:54   DG Chest 2 View  Result Date: 01/21/2020 CLINICAL DATA:  Leg swelling. Additional history provided: Bilateral lower extremity swelling, right arm swelling, history of breast cancer, multiple falls. EXAM: CHEST - 2 VIEW COMPARISON:  Chest radiographs 01/16/2020 and earlier FINDINGS: Stable position of a left chest infusion port catheter with tip projecting in the region of the right atrium. Heart size at the upper limits of normal, unchanged. Bilateral interstitial and airspace opacities, more conspicuous as compared to prior exam 01/16/2020. No evidence of pleural effusion or pneumothorax. Thoracic spondylosis. IMPRESSION: Bilateral interstitial and ill-defined airspace opacities, more conspicuous as compared to prior exam 01/16/2020. Findings may reflect pulmonary edema. Infection cannot be excluded. Electronically Signed   By: Kellie Simmering DO   On: 01/21/2020 11:42   DG Chest 2 View  Result Date: 01/16/2020 CLINICAL DATA:  Hypoxia and lower extremity edema EXAM: CHEST - 2 VIEW COMPARISON:  November 19, 2019. FINDINGS: Port-A-Cath tip is at the cavoatrial junction. No pneumothorax. Lungs are clear. Heart is upper normal in size with pulmonary vascularity normal. No adenopathy. There is degenerative change in the thoracic spine. IMPRESSION: Port-A-Cath tip at cavoatrial junction. Lungs clear. Cardiac silhouette within normal limits. Electronically Signed   By: Lowella Grip III [name]   On:  01/16/2020 12:51   DG Forearm Right  Result Date: 01/21/2020 CLINICAL DATA:  Right arm pain and swelling EXAM: RIGHT FOREARM - 2 VIEW COMPARISON:  None. FINDINGS: There is no evidence of fracture or other focal bone lesions. No cortical destruction or periostitis. Diffuse circumferential soft tissue swelling is evident throughout the forearm. No soft tissue gas or radiopaque foreign body is seen. IMPRESSION: Diffuse circumferential soft tissue swelling throughout the forearm. No acute bony abnormality. Electronically Signed   By: Davina Poke D.O.   On: 01/21/2020 11:44   DG Tibia/Fibula Left  Result Date: 01/21/2020 CLINICAL DATA:  Left leg pain EXAM: LEFT TIBIA AND FIBULA - 2 VIEW COMPARISON:  12/29/2010 FINDINGS: There is no evidence of fracture or other focal bone lesions. No cortical destruction or periostitis. Mineralization is again noted within the soft tissues medial to the mid tibial diaphysis, unchanged from prior study 12/29/2010. Soft tissues are otherwise unremarkable. IMPRESSION: Negative. Electronically Signed   By: Davina Poke D.O.   On: 01/21/2020 11:45   CT Head Wo Contrast  Result Date: 02/10/2020 CLINICAL DATA:  64 year old female with headache. History of metastatic  breast cancer. EXAM: CT HEAD WITHOUT CONTRAST TECHNIQUE: Contiguous axial images were obtained from the base of the skull through the vertex without intravenous contrast. COMPARISON:  Head CT dated 01/16/2020. FINDINGS: Brain: The ventricles and sulci appropriate size for patient's age. Areas of white matter hypodensity involving the inferior right frontal and right temporal lobes similar to prior CT in keeping with metastatic disease. There is no acute intracranial hemorrhage. No mass effect or midline shift. No extra-axial fluid collection. Vascular: No hyperdense vessel or unexpected calcification. Skull: Normal. Negative for fracture or focal lesion. Sinuses/Orbits: The visualized paranasal sinuses and the  right mastoid air cells are clear. Mild left mastoid effusions. No air-fluid level. Other: None IMPRESSION: 1. No acute intracranial hemorrhage. 2. Similar appearance of right frontal and temporal white matter hypodensity in keeping with metastatic disease. Electronically Signed   By: Anner Crete [name]   On: 02/10/2020 23:10   CT Head Wo Contrast  Result Date: 01/16/2020 CLINICAL DATA:  Altered mental status. Positive COVID-19 test last month. Breast cancer metastatic to the brain. EXAM: CT HEAD WITHOUT CONTRAST TECHNIQUE: Contiguous axial images were obtained from the base of the skull through the vertex without intravenous contrast. COMPARISON:  01/12/2020. Brain MR dated 09/04/2019. FINDINGS: Brain: Stable areas of low density in the right frontal and temporal lobes at the locations of the patient's known masses. Stable mild patchy white matter low density in both cerebral hemispheres. No intracranial hemorrhage or CT evidence of acute infarction. No mass effect. Vascular: No hyperdense vessel or unexpected calcification. Skull: Normal. Negative for fracture or focal lesion. Sinuses/Orbits: Unremarkable Other: None. IMPRESSION: 1. No acute abnormality. 2. Stable areas of low density in the right frontal and temporal lobes at the locations of the patient's known masses compatible with metastases. 3. Stable mild chronic small vessel white matter ischemic changes in both cerebral hemispheres. Electronically Signed   By: Claudie Revering [name]   On: 01/16/2020 12:33   CT Head Wo Contrast  Result Date: 01/12/2020 CLINICAL DATA:  Headache, intracranial hemorrhage suspected EXAM: CT HEAD WITHOUT CONTRAST TECHNIQUE: Contiguous axial images were obtained from the base of the skull through the vertex without intravenous contrast. COMPARISON:  CT 11/19/2019, MR 09/04/2019 FINDINGS: Brain: Redemonstration ill-defined lesion with surrounding vasogenic edema in the right frontal lobe, the overall extent of which appears  diminished from prior exam. Additional hypoattenuation in the right temporal pole compatible with a second known lesion as well. No new areas of edema. No evidence of acute infarction, hemorrhage, hydrocephalus, extra-axial collection or mass lesion/mass effect. Vascular: No hyperdense vessel or unexpected calcification. Skull: No calvarial fracture or suspicious osseous lesion. No scalp swelling or hematoma. Mild hyperostosis frontalis interna, a benign incidental finding Sinuses/Orbits: Paranasal sinuses and mastoid air cells are predominantly clear. Included orbital structures are unremarkable. Other: None IMPRESSION: 1. Ill-defined lesions in the right frontal and temporal lobes with surrounding vasogenic edema, compatible with known metastatic disease. Overall the extent of edema is diminished from prior exam. No new areas concerning for additional metastatic disease. 2. No other acute intracranial abnormality. Electronically Signed   By: Lovena Le [name]   On: 01/12/2020 20:44   MR Brain W Wo Contrast  Result Date: 02/01/2020 CLINICAL DATA:  Follow-up breast cancer with brain metastases. Status post radiation therapy 11-10/2019. EXAM: MRI HEAD WITHOUT AND WITH CONTRAST TECHNIQUE: Multiplanar, multiecho pulse sequences of the brain and surrounding structures were obtained without and with intravenous contrast. CONTRAST:  7.9m GADAVIST GADOBUTROL 1 MMOL/ML IV SOLN COMPARISON:  Head CT 01/16/2020 and MRI 09/04/2019 FINDINGS: The study is mildly motion degraded. Brain: A 2.8 x 2.0 x 1.9 cm (AP x transverse x craniocaudal) heterogeneously enhancing right frontal mass and a 4.9 x 2.4 x 2.0 cm right temporal mass have both decreased in size from the prior MRI with both appearing partially cystic. There is persistent mild edema surrounding the right temporal lesion, while right frontal lobe edema has resolved. No new enhancing lesion is identified. There is no acute infarct, midline shift, or extra-axial fluid  collection. Small foci of T2 hyperintensity in the cerebral white matter bilaterally have mildly increased and are nonspecific but compatible with mild chronic small vessel ischemic disease. There is a mildly expanded partially empty sella. There is mild cerebral atrophy. Vascular: Major intracranial vascular flow voids are preserved. Skull and upper cervical spine: No destructive skull lesion. Diffusely diminished bone marrow T1 signal intensity in the included cervical spine, nonspecific but may be related to the patient's known anemia. Sinuses/Orbits: Unremarkable orbits. Clear paranasal sinuses. Small left mastoid effusion. Other: None. IMPRESSION: 1. Decreased size of right frontal and right temporal lobe metastases. Mild residual right temporal edema without significant mass effect. 2. No evidence of new intracranial metastases or acute abnormality. Electronically Signed   By: Logan Bores [name]   On: 02/01/2020 16:44   DG Hand Complete Right  Result Date: 01/21/2020 CLINICAL DATA:  Swelling, history breast cancer EXAM: RIGHT HAND - COMPLETE 3+ VIEW COMPARISON:  None FINDINGS: Osseous demineralization. Scattered mild degenerative changes greatest at first [facility] joint and IP joint thumb. Scattered soft tissue swelling. Fingers superimposed on lateral view limiting assessment. No acute fracture, dislocation or bone destruction. IMPRESSION: Scattered degenerative changes and soft tissue swelling. No acute osseous abnormalities. Electronically Signed   By: Lavonia Dana [name]   On: 01/21/2020 11:42   VAS [name] LOWER EXTREMITY VENOUS (DVT) ([facility] and [facility] 7a-7p)  Result Date: 01/21/2020  Lower Venous DVTStudy Indications: Pain, and Swelling.  Limitations: Poor ultrasound/tissue interface and patient positioning, patient movement. Comparison Study: 01/16/2020 - Negative for DVT. Performing Technologist: Oliver Hum RVT  Examination Guidelines: A complete evaluation includes B-mode imaging, spectral Doppler, color  Doppler, and power Doppler as needed of all accessible portions of each vessel. Bilateral testing is considered an integral part of a complete examination. Limited examinations for reoccurring indications may be performed as noted. The reflux portion of the exam is performed with the patient in reverse Trendelenburg.  +---------+---------------+---------+-----------+----------+--------------+ RIGHT    CompressibilityPhasicitySpontaneityPropertiesThrombus Aging +---------+---------------+---------+-----------+----------+--------------+ CFV      Full           Yes      Yes                                 +---------+---------------+---------+-----------+----------+--------------+ SFJ      Full                                                        +---------+---------------+---------+-----------+----------+--------------+ FV Prox  Full                                                        +---------+---------------+---------+-----------+----------+--------------+  FV Mid   Full                                                        +---------+---------------+---------+-----------+----------+--------------+ FV DistalFull                                                        +---------+---------------+---------+-----------+----------+--------------+ PFV      Full                                                        +---------+---------------+---------+-----------+----------+--------------+ POP      Full           Yes      Yes                                 +---------+---------------+---------+-----------+----------+--------------+ PTV      Full                                                        +---------+---------------+---------+-----------+----------+--------------+ PERO     Full                                                        +---------+---------------+---------+-----------+----------+--------------+    +---------+---------------+---------+-----------+----------+--------------+ LEFT     CompressibilityPhasicitySpontaneityPropertiesThrombus Aging +---------+---------------+---------+-----------+----------+--------------+ CFV      Full           Yes      Yes                                 +---------+---------------+---------+-----------+----------+--------------+ SFJ      Full                                                        +---------+---------------+---------+-----------+----------+--------------+ FV Prox  Full                                                        +---------+---------------+---------+-----------+----------+--------------+ FV Mid   Full                                                        +---------+---------------+---------+-----------+----------+--------------+   FV DistalFull                                                        +---------+---------------+---------+-----------+----------+--------------+ PFV      Full                                                        +---------+---------------+---------+-----------+----------+--------------+ POP      Full           Yes      Yes                                 +---------+---------------+---------+-----------+----------+--------------+ PTV      Full                                                        +---------+---------------+---------+-----------+----------+--------------+ PERO     Full                                                        +---------+---------------+---------+-----------+----------+--------------+     Summary: RIGHT: - There is no evidence of deep vein thrombosis in the lower extremity.  - No cystic structure found in the popliteal fossa.  LEFT: - There is no evidence of deep vein thrombosis in the lower extremity.  - No cystic structure found in the popliteal fossa.  *See table(s) above for measurements and observations. Electronically signed  by Harold Barban MD on 01/21/2020 at 9:44:28 PM.    Final    VAS [name] LOWER EXTREMITY VENOUS (DVT) (ONLY [facility] & [facility])  Result Date: 01/16/2020  Lower Venous DVTStudy Indications: Swelling.  Limitations: Body habitus and poor ultrasound/tissue interface. Comparison Study: No prior exam. Performing Technologist: Baldwin Crown ARDMS, RVT  Examination Guidelines: A complete evaluation includes B-mode imaging, spectral Doppler, color Doppler, and power Doppler as needed of all accessible portions of each vessel. Bilateral testing is considered an integral part of a complete examination. Limited examinations for reoccurring indications may be performed as noted. The reflux portion of the exam is performed with the patient in reverse Trendelenburg.  +---------+---------------+---------+-----------+----------+------------------+ RIGHT    CompressibilityPhasicitySpontaneityPropertiesThrombus Aging     +---------+---------------+---------+-----------+----------+------------------+ CFV      Full           Yes      Yes                                     +---------+---------------+---------+-----------+----------+------------------+ SFJ      Full                                                            +---------+---------------+---------+-----------+----------+------------------+  FV Prox  Full                                                            +---------+---------------+---------+-----------+----------+------------------+ FV Mid   Full                                                            +---------+---------------+---------+-----------+----------+------------------+ FV DistalFull                                                            +---------+---------------+---------+-----------+----------+------------------+ PFV      Full                                                             +---------+---------------+---------+-----------+----------+------------------+ POP      Full           Yes      Yes                                     +---------+---------------+---------+-----------+----------+------------------+ PTV      Full                                         visualized with                                                          color              +---------+---------------+---------+-----------+----------+------------------+ PERO     Full                                         visualized with                                                          color              +---------+---------------+---------+-----------+----------+------------------+   +---------+---------------+---------+-----------+----------+------------------+ LEFT     CompressibilityPhasicitySpontaneityPropertiesThrombus Aging     +---------+---------------+---------+-----------+----------+------------------+ CFV      Full           Yes      Yes                                     +---------+---------------+---------+-----------+----------+------------------+  SFJ      Full                                                            +---------+---------------+---------+-----------+----------+------------------+ FV Prox  Full                                                            +---------+---------------+---------+-----------+----------+------------------+ FV Mid   Full                                                            +---------+---------------+---------+-----------+----------+------------------+ FV DistalFull                                         visualized with                                                          color              +---------+---------------+---------+-----------+----------+------------------+ PFV      Full                                                             +---------+---------------+---------+-----------+----------+------------------+ POP      Full           Yes      Yes                                     +---------+---------------+---------+-----------+----------+------------------+ PTV      Full                                         visualized with                                                          color              +---------+---------------+---------+-----------+----------+------------------+ PERO     Full  visualized with                                                          color              +---------+---------------+---------+-----------+----------+------------------+     Summary: BILATERAL: - No evidence of deep vein thrombosis seen in the lower extremities, bilaterally.  RIGHT: - Portions of this examination were limited- see technologist comments above. - No cystic structure found in the popliteal fossa.  LEFT: - Portions of this examination were limited- see technologist comments above. - No cystic structure found in the popliteal fossa.  *See table(s) above for measurements and observations. Electronically signed by Monica Martinez MD on 01/16/2020 at 4:22:13 PM.    Final     ASSESSMENT:  64 y.o. [place] woman with stage IV breast cancer manifested chiefly by loco-regional nodal disease (neck, chest) and skin involvement, without liver, bone, or brain metastases documented   (1) Status post right upper inner quadrant lumpectomy and sentinel lymph node sampling 03/04/2002 for 2 separate foci of invasive ductal carcinoma, mpT2 pN1 or stage IIB, both foci grade 3, both estrogen and progesterone receptor positive, both HercepTest 3+, Mib-1 56%  (2) Reexcision for margins 05/27/2002 showed no residual cancer in the breast.  (3) The patient refused adjuvant systemic therapy.  (4) Adjuvant radiation treatment completed 08/14/2002.  (5) recurrence in the  right breast in 02/2004 showing a morphologically different tumor, again grade 3, again estrogen and progesterone receptor negative, with an MIB-1 of 14% and Herceptest 3+.  (5) Between 03/2004 and 07/2004 she received dose dense Doxorubicin/Cyclophosphamide x 4 given with trastuzumab, followed by weekly Docetaxel x 8, again given with trastuzumab.  (6) Right mastectomy 07/13/2004 showed scattered microscopic foci of residual disease over an area  greater than 5 cm. Margins were negative.  (7) Postoperative Docetaxel continued until 09/2004.  (8) Trastuzumab (Herceptin) given 08/2004 through 01/2012 with some brief interruptions.  RECURRENT/ STAGE IV DISEASE December 2006 (9) Isolated right cervical nodal recurrence 10/2005, treated with radiation to the right supraclavicular area (total 41.5 gray) completed 12/29/2005.  (10) Navelbine given together with Herceptin  11/2005 through 03/2006.  (9) Left mastectomy 02/13/2006 for ductal carcinoma in situ, grade 2, estrogen and progesterone receptor negative, with negative margins; 0 of 3 lymph nodes involved  (10) treated with Lapatinib and Capecitabine before 10/2009, for an unclear duration and with unclear results (cannot locate data on chart review).  (11) Status post right supraclavicular lymph node biopsy 09/2010 again positive for an invasive ductal carcinoma, estrogen and progesterone receptor negative, HER-2 positive by CISH with a ratio 4.25.  (12) Navelbine given together with Herceptin between 05/2011 and 11/2011.  (13) Carboplatin/ Gemcitabine/ Herceptin given for 2 cycles, in 12/2011 and 01/2012.  (14) TDM-1 (Kadcyla) started 02/2012. Last dose 10/02/2013 after which the patient discontinued treatment at her own discretion. Echo on December 2014 showed a well preserved ejection fraction.  (15) Deep vein thrombosis of the right upper extremity documented 04/20/2011.  She completed anticoagulant therapy with Coumadin  on 03/25/2013.  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              (a) pretreatment echocardiogram 06/06/2016 showed a 60-65% ejection fraction             (b) significant compliance problems compromise response to treatment             (c)  echocardiogram 02/20/17 LVEF 55-60%             (d) Abraxane discontinued 07/04/2017 because of possible neuropathy symptoms             (e) Abraxane resumed 09/27/2017             (f) echocardiogram 07/20/2017 showed an ejection fraction of 60-65%             (g) echo 01/22/2018 showed an ejection fraction of 50-55%             (h) CT scan of the chest 05/03/2018 shows no disease involving the lungs liver or lymph nodes, with stable subcutaneous masses as previously noted             (I) Abraxane discontinued August 2019 because of concerns regarding neuropathy-- gemcitabine substituted             (j) echocardiogram 08/20/2018 shows an ejection fraction in the 55-60% range (done every 6 months)             (k) gemcitabine discontinued because of multiple symptoms, Abraxane restarted 09/11/2018, given once every 4 weeks             (l) echocardiogram 07/17/2019 shows a well-preserved  ejection fraction at 55-60%             (m) restaging PET scan 08/20/2019 shows hypermetabolic adenopathy, subcutaneous malignancy as clinically appreciated, but no liver or lung parenchymal lesions   (21) Chronic pain secondary to known metastatic disease             (a) patient signed pain contract 10/19/2016             (b) as of 11/16/2016 receives her pain medications from Dr [name]             (c)  Dr [name] no longer able to prescribe as of February 2019             (d) all pain medications now through [facility], Dr Mirna Mires (as of April 2019)  (22) left breast and left axillary adenopathy noted clinically             (a) left axillary lymph node biopsy on 07/24/2019 benign  (23) BRAIN METASTASES: frontal and temporal lobe metastases noted on brain MRI 09/04/2019             (a) adjuvant radiation 09/11/2019-10/16/2019:                          The brain was treated  to 30 Gy in 10 fractions.              (b) tucatinib prescribed December 2020 at 150 mg daily because of methadone interaction, finally started March 2021.             (c) noncontrast head CT 01/12/2020 stable lesions/improved edema             (d) brain MRI 02/01/2020 shows decreased size of the right frontal and temporal lobe metastases with mild residual edema, no mass-effect, and no evidence of new mets  (24) COVID-19 infection 2019-11-19   PLAN: [name] is now admitted due to generalized weakness and UTI.  Continue IV antibiotics and follow urine and blood cultures.  Her tucatinib is currently on hold per pharmacy protocol secondary to acute infection.  Recommend restarting this as soon as her infection improves.  Her current dose is 150 mg daily.    I recommend PT/OT consult due to her generalized weakness and deconditioning.  The patient's pain medications are prescribed by Dr. [name] whose telephone number is [phone] and fax number of [phone].  Please contact him with  questions related to her pain medications.   LOS: 0 days   [name], DNP, AGPCNP-BC, AOCNP 02/11/20

## 2020-02-11 NOTE — Progress Notes (Signed)
PRELIMINARY NOTE: Appreciate your care of this stage IV breast cancer patient. Note all her pain medicines are prescribed by Dr [name], who unfortunately is not in Epic. His number is [phone] and his fax number is [phone]  Full note to follow  GM

## 2020-02-11 NOTE — Progress Notes (Signed)
MEDICATION RELATED CONSULT NOTE - INITIAL   Pharmacy Consult for Tucatinib Indication: oral chemo  Allergies  Allergen Reactions  . Tramadol Nausea Only  . Hydrocodone-Acetaminophen Itching and Rash  . Pork-Derived Products Other (See Comments)    Pt says she just doesn't eat pork; has no reaction to pork products    Patient Measurements: Height: 5\' 7"  (170.2 cm) Weight: 89 kg (196 lb 4.8 oz) IBW/kg (Calculated) : 61.6 Adjusted Body Weight:   Vital Signs: Temp: 98.7 F (37.1 C) (04/06 0604) Temp Source: Oral (04/06 0604) BP: 104/68 (04/06 0604) Pulse Rate: 90 (04/06 0604) Intake/Output from previous day: 04/05 0701 - 04/06 0700 In: 315.8 [IV Piggyback:315.8] Out: -  Intake/Output from this shift: Total I/O In: 315.8 [IV Piggyback:315.8] Out: -   Labs: Recent Labs    02/10/20 1552  WBC 7.5  HGB 10.4*  HCT 33.3*  PLT 239  CREATININE 0.90  ALBUMIN 3.0*  PROT 7.1  AST 51*  ALT 26  ALKPHOS 89  BILITOT 0.8   Estimated Creatinine Clearance: 72.4 mL/min (by C-G formula based on SCr of 0.9 mg/dL).   Microbiology: No results found for this or any previous visit (from the past 720 hour(s)).  Medical History: Past Medical History:  Diagnosis Date  . Breast cancer ([place])   . DVT (deep venous thrombosis) ([place]) 10/07/2011  . Fever 02/06/2018  . Hypertension   . Radiation 11/29/2005-12/29/2005   right supraclavicular area 4147 cGy  . Radiation 06/26/02-08/14/02   right breast 5040 cGy, tumor bed boosted to 1260 cGy  . Rheumatic fever     Medications:  Medications Prior to Admission  Medication Sig Dispense Refill Last Dose  . allopurinol (ZYLOPRIM) 300 MG tablet Take 300 mg by mouth daily.   Past Week at Unknown time  . ALPRAZolam (XANAX) 1 MG tablet Take 1 tablet (1 mg total) by mouth at bedtime as needed for sleep. 15 tablet 0 Past Week at Unknown time  . Ascorbic Acid (VITAMIN C PO) Take 1 tablet by mouth daily.   Past Week at Unknown time  . cholecalciferol  (VITAMIN D3) 25 MCG (1000 UNIT) tablet Take 1,000 Units by mouth daily.   Past Week at Unknown time  . furosemide (LASIX) 20 MG tablet Take 1 tablet (20 mg total) by mouth daily. 7 tablet 0 Past Week at Unknown time  . gabapentin (NEURONTIN) 100 MG capsule Take 100 mg by mouth 3 (three) times daily.   Past Week at Unknown time  . KLOR-CON M20 20 MEQ tablet Take 20 mEq by mouth daily.   Past Week at Unknown time  . magic mouthwash SOLN Take 10 mLs by mouth 4 (four) times daily as needed for mouth pain. Swish and spit.    unknown  . methadone (DOLOPHINE) 10 MG tablet Take 10 mg by mouth. Bottle states 1 tab 5 times a day with fill date of 01/11/2020 per Dr Mirna Mires   Past Week at Unknown time  . Multiple Vitamin (MULTIVITAMIN) capsule Take 1 capsule by mouth daily.     Past Week at Unknown time  . omeprazole (PRILOSEC) 40 MG capsule Take 1 capsule (40 mg total) by mouth daily. 30 capsule 1 Past Week at Unknown time  . ondansetron (ZOFRAN) 4 MG tablet TAKE 1 TABLET BY MOUTH THREE TIMES A DAY AS NEEDED (Patient taking differently: Take 4 mg by mouth every 8 (eight) hours as needed for nausea or vomiting. ) 20 tablet 3 unknown  . Oxycodone HCl 20 MG TABS  Take 1 tablet (20 mg total) by mouth 3 (three) times daily as needed (pain). 12 tablet 0 unknown  . prochlorperazine (COMPAZINE) 10 MG tablet Take 1 tablet (10 mg total) by mouth every 6 (six) hours as needed for nausea or vomiting. 30 tablet 1 unknown  . sertraline (ZOLOFT) 100 MG tablet TAKE 1 TABLET BY MOUTH EVERY DAY 90 tablet 2 Past Week at Unknown time  . triamterene-hydrochlorothiazide (DYAZIDE) 37.5-25 MG capsule Take 1 each (1 capsule total) by mouth every morning. 30 capsule 3 Past Week at Unknown time  . tucatinib (TUKYSA) 150 MG tablet Take 1 tablet (150 mg total) by mouth every morning. Take as directed by MD. 60 tablet 3 Past Week at Unknown time  . cholecalciferol (VITAMIN D) 1000 units tablet Take 1 tablet (1,000 Units total) by mouth daily.  (Patient not taking: Reported on 11/19/2019) 90 tablet 4 Completed Course at Unknown time  . naloxone (NARCAN) nasal spray 4 mg/0.1 mL Administer single spray of naloxone into one nostril. Additionally, spray can be given every 2-3 minutes as needed until emergency medical assistance arrives. Call 911 immediately after use. (Patient not taking: Reported on 01/15/2020) 1 each 2 Not Taking at Unknown time  . polyethylene glycol (MIRALAX / GLYCOLAX) 17 g packet Take 17 g by mouth daily as needed for mild constipation. (Patient not taking: Reported on 01/15/2020) 14 each 0 Completed Course at Unknown time  . senna-docusate (SENOKOT-S) 8.6-50 MG tablet Take 1 tablet by mouth 2 (two) times daily as needed for moderate constipation. (Patient not taking: Reported on 01/15/2020) 60 tablet 1 Not Taking at Unknown time   Scheduled:  . allopurinol  300 mg Oral Daily  . Chlorhexidine Gluconate Cloth  6 each Topical Daily  . multivitamin with minerals  1 tablet Oral Daily  . pantoprazole  40 mg Oral Daily  . potassium chloride SA  20 mEq Oral Daily  . sertraline  100 mg Oral Daily  . sodium chloride flush  3 mL Intravenous Q12H  . tucatinib  150 mg Oral q morning - 10a    Assessment: Patient taking tucatinib for metastatic breast cancer.  All oral chemo is to be held for patients with active infection.  Patient has active IV antibiotic order at this time.  Goal of Therapy:  Safe and effective use of tucatinib  Plan:  D/c tucatinib at this time.  Tyler Deis, Shea Stakes [place] 02/11/2020,6:27 AM

## 2020-02-11 NOTE — Progress Notes (Signed)
[place]  Telephone:([phone]) [phone] Fax:(336) [phone]       Adaly could not come to the clinic today because she was admitted to the hospital.  See ED hospital notes for details.   ID: [name]   DOB: 01-May-1956  MR#: [id number]  GQB#:[id number]  Patient Care Team: [name], [name], MD as PCP - General (Oncology) [name], [name], MD as Consulting Physician (Oncology) [name], MD as Consulting Physician (Radiation Oncology) [name], [name], MD as Consulting Physician (Cardiology) [name], [name], MD as Consulting Physician (Psychiatry) [name], MD as Consulting Physician (Anesthesiology) [name], MD as Consulting Physician (Internal Medicine) [name], [name] 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 She is accompanied by her daughter, [name], who is the patient's healthcare power of attorney and who contacted [name] this morning with concerns for her mother's overall decline in status.  She continues on Abraxane/trastuzumab/Pertuzumab.  Her most recent echocardiogram on 12/25/2019 showed an ejection fraction of 60-65%.  Since her last visit, she underwent brain MRI on 01/31/2020 showing: decreased size of right frontal and right temporal lobe metastases; mild residual right temporal edema without significant mass effect; no evidence of new intracranial metastases or acute abnormality.  She also presented to the ED on 01/16/2020 with bilateral extremity swelling and foot pain. She reported the foot pain was causing issues with her sleep, and she fell asleep several times in the ED. Head CT was stable, and  chest x-ray was normal. Lower extremity doppler was negative for DVT.  She presented back to the ED on 01/21/2020 with left leg and right arm pain, as well as right foot swelling. Chest x-ray showed: more conspicuous bilateral interstitial and ill-defined airspace opacities. Right hand and forearm x-rays showed soft tissue swelling with no acute bony abnormalities. Repeat lower extremity ultrasound was again negative for DVT. She was, however, started on Lasix at that time.   REVIEW OF SYSTEMS:    BREAST CANCER HISTORY: From the original intake note:  The patient originally presented in May 2003 when she noticed a lump in the upper inner quadrant of her right breast in September 2003.  She sought attention and had a mammogram which showed an obvious carcinoma in the upper inner quadrant of the right breast, approximately 2 cm.  There were some enlarged lymph nodes in the axilla and an FNA done showed those consistent with malignant cells, most likely an invasive ductal carcinoma.  At that point she was unsure of what to do and was referred by Dr. [name] for a discussion of her treatment options.  By biopsy, it was ER/PR negative and HER-2 was 3+.  DNA index was 1.42.  We reiterated that it was most important to have her disease surgically addressed at that point and had recommended lumpectomy and axillary nodes to be addressed with which she followed through and on 04-03-02, Dr. [name] performed a right partial mastectomy and a right axillary lymph node dissection.  Final pathology revealed a 2.4 cm. high grade, Grade III invasive ductal carcinoma with an adjacent .8 cm. also high grade invasive ductal carcinoma which was felt to represent an intramammary metastasis rather than a second primary.  A smaller mass was  just medial to the larger mass.  The smaller mass was associated with high grade DCIS component.  There was no definite lymphovascular invasion identified.  However, one of fourteen  axillary lymph nodes did contain a 1.5 cm. metastatic deposit.  Postoperatively she did very well.  We reiterated the fact that she did need adjuvant chemotherapy, however, she refused and decided that she would pursue radiation.  She received radiation and completed that on 08-14-02. Her subsequent history is as detailed below.   PAST MEDICAL HISTORY: Past Medical History:  Diagnosis Date  . Breast cancer ([place])   . DVT (deep venous thrombosis) ([place]) 10/07/2011  . Fever 02/06/2018  . Hypertension   . Radiation 11/29/2005-12/29/2005   right supraclavicular area 4147 cGy  . Radiation 06/26/02-08/14/02   right breast 5040 cGy, tumor bed boosted to 1260 cGy  . Rheumatic fever     PAST SURGICAL HISTORY: Past Surgical History:  Procedure Laterality Date  . BREAST SURGERY    . MASTECTOMY Bilateral   . PORT A CATH REVISION      FAMILY HISTORY Family History  Problem Relation Age of Onset  . Pancreatic cancer Mother   . Kidney cancer Sister 64  . Breast cancer Sister 27  . Breast cancer Other 62  The patient's father died in his 50D  from complications of alcohol abuse. The patient's mother died in her 44s from pancreatic cancer. The patient has 6 brothers, 2 sisters. Both her sisters have had breast cancer, both diagnosed after the age of 67. The patient has not had genetic testing so far.   GYNECOLOGIC HISTORY: Menarche age 77; first live birth age 83. She is GXP4. She is not sure when she stopped having periods. She never used hormone replacement.   SOCIAL HISTORY: The patient has worked in the past as a Psychologist, counselling. At home in addition to the patient are her husband [name], originally from [place], who works as a Administrator.  The other children are [name], who lives with the patient and has a college degree in psychology; she works as a Probation officer; no longer living in the home is her daughter [name] (she is studying to be a Marine scientist) and grandson.son [name], lives in [place]  and works as a Higher education careers adviser. [name] also sometimes stays in the home. In addition the patient has an aide who helps her almost daily.    ADVANCED DIRECTIVES: The patient has completed advanced directives and name her daughter [name] as her healthcare power of attorney.  The patient also completed a living will.  This is notarized.  These documents are being sent for scanning on epic on 01/15/2020  HEALTH MAINTENANCE: Social History   Tobacco Use  . Smoking status: Former Research scientist (life sciences)  . Smokeless tobacco: Never Used  Substance Use Topics  . Alcohol use: Yes    Comment: Rarely  . Drug use: No     Colonoscopy:  Not on file  PAP:  Not on file  Bone density:  Not on file  Lipid panel:  Not on file   Allergies  Allergen Reactions  . Tramadol Nausea Only  . Hydrocodone-Acetaminophen Itching and Rash  . Pork-Derived Products Other (See Comments)    Pt says she just doesn't eat pork; has no reaction to pork products    No current facility-administered medications for this visit.   No current outpatient medications on file.   Facility-Administered Medications Ordered in Other Visits  Medication Dose Route Frequency Provider Last Rate  Last Admin  . allopurinol (ZYLOPRIM) tablet 300 mg  300 mg Oral Daily Howerter, Justin B, DO   300 mg at 02/11/20 1047  . cefTRIAXone (ROCEPHIN) 1 g in sodium chloride 0.9 % 100 mL IVPB  1 g Intravenous QHS Howerter, Justin B, DO 200 mL/hr at 02/11/20 0336 1 g at 02/11/20 0336  . Chlorhexidine Gluconate Cloth 2 % PADS 6 each  6 each Topical Daily Howerter, Justin B, DO      . multivitamin with minerals tablet 1 tablet  1 tablet Oral Daily Howerter, Justin B, DO   1 tablet at 02/11/20 1047  . ondansetron (ZOFRAN) injection 4 mg  4 mg Intravenous Q6H PRN Howerter, Justin B, DO      . pantoprazole (PROTONIX) EC tablet 40 mg  40 mg Oral Daily Howerter, Justin B, DO   40 mg at 02/11/20 1047  . potassium chloride SA (KLOR-CON) CR tablet 20 mEq   20 mEq Oral Daily Howerter, Justin B, DO   20 mEq at 02/11/20 1048  . sertraline (ZOLOFT) tablet 100 mg  100 mg Oral Daily Howerter, Justin B, DO   100 mg at 02/11/20 1048  . sodium chloride 0.9 % injection 10 mL  10 mL Intravenous PRN [name], [name], MD   10 mL at 03/04/14 1421  . sodium chloride flush (NS) 0.9 % injection 10 mL  10 mL Intracatheter PRN [name], [name], MD   10 mL at 07/30/19 1627  . sodium chloride flush (NS) 0.9 % injection 10-40 mL  10-40 mL Intracatheter PRN Howerter, Justin B, DO      . sodium chloride flush (NS) 0.9 % injection 3 mL  3 mL Intravenous Q12H Howerter, Justin B, DO        OBJECTIVE: Middle-aged African-American woman  There were no vitals filed for this visit. Wt Readings from Last 3 Encounters:  02/11/20 196 lb 4.8 oz (89 kg)  02/03/20 197 lb 11.2 oz (89.7 kg)  01/16/20 216 lb (98 kg)   There is no height or weight on file to calculate BMI.      Photo 07/04/2017     Photo 08/23/2018    LAB RESULTS:.  Lab Results  Component Value Date   WBC 5.7 02/11/2020   NEUTROABS 3.7 02/11/2020   HGB 9.9 (L) 02/11/2020   HCT 31.5 (L) 02/11/2020   MCV 91.8 02/11/2020   PLT 237 02/11/2020      Chemistry      Component Value Date/Time   NA 139 02/11/2020 0707   NA 142 11/09/2017 1214   K 3.4 (L) 02/11/2020 0707   K 3.9 11/09/2017 1214   CL 102 02/11/2020 0707   CL 101 03/07/2013 1411   CO2 25 02/11/2020 0707   CO2 32 (H) 11/09/2017 1214   BUN 18 02/11/2020 0707   BUN 19.1 11/09/2017 1214   CREATININE 1.19 (H) 02/11/2020 0707   CREATININE 0.99 01/15/2020 1020   CREATININE 1.0 11/09/2017 1214      Component Value Date/Time   CALCIUM 8.9 02/11/2020 0707   CALCIUM 9.1 11/09/2017 1214   ALKPHOS 79 02/11/2020 0707   ALKPHOS 120 11/09/2017 1214   AST 44 (H) 02/11/2020 0707   AST 25 01/15/2020 1020   AST 30 11/09/2017 1214   ALT 24 02/11/2020 0707   ALT 42 01/15/2020 1020   ALT 19 11/09/2017 1214   BILITOT 0.4 02/11/2020 0707    BILITOT 0.3 01/15/2020 1020   BILITOT <0.22 11/09/2017 1214  STUDIES: DG Chest 2 View  Result Date: 02/10/2020 CLINICAL DATA:  Altered mental status. EXAM: CHEST - 2 VIEW COMPARISON:  January 25, 2020 FINDINGS: There is a well-positioned left-sided Port-A-Cath. The heart size is stable. Areas of scarring and atelectasis are noted bilaterally. There is no pneumothorax. There are degenerative changes throughout the visualized thoracic spine. IMPRESSION: No active cardiopulmonary disease. Electronically Signed   By: Constance Holster [name]   On: 02/10/2020 23:19   DG Chest 2 View  Result Date: 01/25/2020 CLINICAL DATA:  Cough and shortness of breath. History of breast cancer. Patient is unsteady on her feet. Patient has difficulty holding still. EXAM: CHEST - 2 VIEW COMPARISON:  01/21/2020 FINDINGS: Patient's LEFT-sided power port, tip to the superior vena cava. Heart is UPPER limits normal. There are patchy infiltrates in the lungs persist, but there has been some improvement in aeration accounting for differences in technique. No pleural effusions. RIGHT axillary node dissection. Patient motion artifact. IMPRESSION: Persistent but improved bilateral infiltrates. Electronically Signed   By: Nolon Nations [name]   On: 01/25/2020 16:54   DG Chest 2 View  Result Date: 01/21/2020 CLINICAL DATA:  Leg swelling. Additional history provided: Bilateral lower extremity swelling, right arm swelling, history of breast cancer, multiple falls. EXAM: CHEST - 2 VIEW COMPARISON:  Chest radiographs 01/16/2020 and earlier FINDINGS: Stable position of a left chest infusion port catheter with tip projecting in the region of the right atrium. Heart size at the upper limits of normal, unchanged. Bilateral interstitial and airspace opacities, more conspicuous as compared to prior exam 01/16/2020. No evidence of pleural effusion or pneumothorax. Thoracic spondylosis. IMPRESSION: Bilateral interstitial and ill-defined  airspace opacities, more conspicuous as compared to prior exam 01/16/2020. Findings may reflect pulmonary edema. Infection cannot be excluded. Electronically Signed   By: Kellie Simmering DO   On: 01/21/2020 11:42   DG Chest 2 View  Result Date: 01/16/2020 CLINICAL DATA:  Hypoxia and lower extremity edema EXAM: CHEST - 2 VIEW COMPARISON:  November 19, 2019. FINDINGS: Port-A-Cath tip is at the cavoatrial junction. No pneumothorax. Lungs are clear. Heart is upper normal in size with pulmonary vascularity normal. No adenopathy. There is degenerative change in the thoracic spine. IMPRESSION: Port-A-Cath tip at cavoatrial junction. Lungs clear. Cardiac silhouette within normal limits. Electronically Signed   By: Lowella Grip III [name]   On: 01/16/2020 12:51   DG Forearm Right  Result Date: 01/21/2020 CLINICAL DATA:  Right arm pain and swelling EXAM: RIGHT FOREARM - 2 VIEW COMPARISON:  None. FINDINGS: There is no evidence of fracture or other focal bone lesions. No cortical destruction or periostitis. Diffuse circumferential soft tissue swelling is evident throughout the forearm. No soft tissue gas or radiopaque foreign body is seen. IMPRESSION: Diffuse circumferential soft tissue swelling throughout the forearm. No acute bony abnormality. Electronically Signed   By: Davina Poke D.O.   On: 01/21/2020 11:44   DG Tibia/Fibula Left  Result Date: 01/21/2020 CLINICAL DATA:  Left leg pain EXAM: LEFT TIBIA AND FIBULA - 2 VIEW COMPARISON:  12/29/2010 FINDINGS: There is no evidence of fracture or other focal bone lesions. No cortical destruction or periostitis. Mineralization is again noted within the soft tissues medial to the mid tibial diaphysis, unchanged from prior study 12/29/2010. Soft tissues are otherwise unremarkable. IMPRESSION: Negative. Electronically Signed   By: Davina Poke D.O.   On: 01/21/2020 11:45   CT Head Wo Contrast  Result Date: 02/10/2020 CLINICAL DATA:  65 year old female with  headache. History of  metastatic breast cancer. EXAM: CT HEAD WITHOUT CONTRAST TECHNIQUE: Contiguous axial images were obtained from the base of the skull through the vertex without intravenous contrast. COMPARISON:  Head CT dated 01/16/2020. FINDINGS: Brain: The ventricles and sulci appropriate size for patient's age. Areas of white matter hypodensity involving the inferior right frontal and right temporal lobes similar to prior CT in keeping with metastatic disease. There is no acute intracranial hemorrhage. No mass effect or midline shift. No extra-axial fluid collection. Vascular: No hyperdense vessel or unexpected calcification. Skull: Normal. Negative for fracture or focal lesion. Sinuses/Orbits: The visualized paranasal sinuses and the right mastoid air cells are clear. Mild left mastoid effusions. No air-fluid level. Other: None IMPRESSION: 1. No acute intracranial hemorrhage. 2. Similar appearance of right frontal and temporal white matter hypodensity in keeping with metastatic disease. Electronically Signed   By: Anner Crete [name]   On: 02/10/2020 23:10   CT Head Wo Contrast  Result Date: 01/16/2020 CLINICAL DATA:  Altered mental status. Positive COVID-19 test last month. Breast cancer metastatic to the brain. EXAM: CT HEAD WITHOUT CONTRAST TECHNIQUE: Contiguous axial images were obtained from the base of the skull through the vertex without intravenous contrast. COMPARISON:  01/12/2020. Brain MR dated 09/04/2019. FINDINGS: Brain: Stable areas of low density in the right frontal and temporal lobes at the locations of the patient's known masses. Stable mild patchy white matter low density in both cerebral hemispheres. No intracranial hemorrhage or CT evidence of acute infarction. No mass effect. Vascular: No hyperdense vessel or unexpected calcification. Skull: Normal. Negative for fracture or focal lesion. Sinuses/Orbits: Unremarkable Other: None. IMPRESSION: 1. No acute abnormality. 2. Stable areas  of low density in the right frontal and temporal lobes at the locations of the patient's known masses compatible with metastases. 3. Stable mild chronic small vessel white matter ischemic changes in both cerebral hemispheres. Electronically Signed   By: Claudie Revering [name]   On: 01/16/2020 12:33   CT Head Wo Contrast  Result Date: 01/12/2020 CLINICAL DATA:  Headache, intracranial hemorrhage suspected EXAM: CT HEAD WITHOUT CONTRAST TECHNIQUE: Contiguous axial images were obtained from the base of the skull through the vertex without intravenous contrast. COMPARISON:  CT 11/19/2019, MR 09/04/2019 FINDINGS: Brain: Redemonstration ill-defined lesion with surrounding vasogenic edema in the right frontal lobe, the overall extent of which appears diminished from prior exam. Additional hypoattenuation in the right temporal pole compatible with a second known lesion as well. No new areas of edema. No evidence of acute infarction, hemorrhage, hydrocephalus, extra-axial collection or mass lesion/mass effect. Vascular: No hyperdense vessel or unexpected calcification. Skull: No calvarial fracture or suspicious osseous lesion. No scalp swelling or hematoma. Mild hyperostosis frontalis interna, a benign incidental finding Sinuses/Orbits: Paranasal sinuses and mastoid air cells are predominantly clear. Included orbital structures are unremarkable. Other: None IMPRESSION: 1. Ill-defined lesions in the right frontal and temporal lobes with surrounding vasogenic edema, compatible with known metastatic disease. Overall the extent of edema is diminished from prior exam. No new areas concerning for additional metastatic disease. 2. No other acute intracranial abnormality. Electronically Signed   By: Lovena Le [name]   On: 01/12/2020 20:44   MR Brain W Wo Contrast  Result Date: 02/01/2020 CLINICAL DATA:  Follow-up breast cancer with brain metastases. Status post radiation therapy 11-10/2019. EXAM: MRI HEAD WITHOUT AND WITH CONTRAST  TECHNIQUE: Multiplanar, multiecho pulse sequences of the brain and surrounding structures were obtained without and with intravenous contrast. CONTRAST:  7.56m GADAVIST GADOBUTROL 1 MMOL/ML IV SOLN  COMPARISON:  Head CT 01/16/2020 and MRI 09/04/2019 FINDINGS: The study is mildly motion degraded. Brain: A 2.8 x 2.0 x 1.9 cm (AP x transverse x craniocaudal) heterogeneously enhancing right frontal mass and a 4.9 x 2.4 x 2.0 cm right temporal mass have both decreased in size from the prior MRI with both appearing partially cystic. There is persistent mild edema surrounding the right temporal lesion, while right frontal lobe edema has resolved. No new enhancing lesion is identified. There is no acute infarct, midline shift, or extra-axial fluid collection. Small foci of T2 hyperintensity in the cerebral white matter bilaterally have mildly increased and are nonspecific but compatible with mild chronic small vessel ischemic disease. There is a mildly expanded partially empty sella. There is mild cerebral atrophy. Vascular: Major intracranial vascular flow voids are preserved. Skull and upper cervical spine: No destructive skull lesion. Diffusely diminished bone marrow T1 signal intensity in the included cervical spine, nonspecific but may be related to the patient's known anemia. Sinuses/Orbits: Unremarkable orbits. Clear paranasal sinuses. Small left mastoid effusion. Other: None. IMPRESSION: 1. Decreased size of right frontal and right temporal lobe metastases. Mild residual right temporal edema without significant mass effect. 2. No evidence of new intracranial metastases or acute abnormality. Electronically Signed   By: Logan Bores [name]   On: 02/01/2020 16:44   DG Hand Complete Right  Result Date: 01/21/2020 CLINICAL DATA:  Swelling, history breast cancer EXAM: RIGHT HAND - COMPLETE 3+ VIEW COMPARISON:  None FINDINGS: Osseous demineralization. Scattered mild degenerative changes greatest at first [facility] joint and IP  joint thumb. Scattered soft tissue swelling. Fingers superimposed on lateral view limiting assessment. No acute fracture, dislocation or bone destruction. IMPRESSION: Scattered degenerative changes and soft tissue swelling. No acute osseous abnormalities. Electronically Signed   By: Lavonia Dana [name]   On: 01/21/2020 11:42   VAS [name] LOWER EXTREMITY VENOUS (DVT) ([facility] and [facility] 7a-7p)  Result Date: 01/21/2020  Lower Venous DVTStudy Indications: Pain, and Swelling.  Limitations: Poor ultrasound/tissue interface and patient positioning, patient movement. Comparison Study: 01/16/2020 - Negative for DVT. Performing Technologist: Oliver Hum RVT  Examination Guidelines: A complete evaluation includes B-mode imaging, spectral Doppler, color Doppler, and power Doppler as needed of all accessible portions of each vessel. Bilateral testing is considered an integral part of a complete examination. Limited examinations for reoccurring indications may be performed as noted. The reflux portion of the exam is performed with the patient in reverse Trendelenburg.  +---------+---------------+---------+-----------+----------+--------------+ RIGHT    CompressibilityPhasicitySpontaneityPropertiesThrombus Aging +---------+---------------+---------+-----------+----------+--------------+ CFV      Full           Yes      Yes                                 +---------+---------------+---------+-----------+----------+--------------+ SFJ      Full                                                        +---------+---------------+---------+-----------+----------+--------------+ FV Prox  Full                                                        +---------+---------------+---------+-----------+----------+--------------+  FV Mid   Full                                                        +---------+---------------+---------+-----------+----------+--------------+ FV DistalFull                                                         +---------+---------------+---------+-----------+----------+--------------+ PFV      Full                                                        +---------+---------------+---------+-----------+----------+--------------+ POP      Full           Yes      Yes                                 +---------+---------------+---------+-----------+----------+--------------+ PTV      Full                                                        +---------+---------------+---------+-----------+----------+--------------+ PERO     Full                                                        +---------+---------------+---------+-----------+----------+--------------+   +---------+---------------+---------+-----------+----------+--------------+ LEFT     CompressibilityPhasicitySpontaneityPropertiesThrombus Aging +---------+---------------+---------+-----------+----------+--------------+ CFV      Full           Yes      Yes                                 +---------+---------------+---------+-----------+----------+--------------+ SFJ      Full                                                        +---------+---------------+---------+-----------+----------+--------------+ FV Prox  Full                                                        +---------+---------------+---------+-----------+----------+--------------+ FV Mid   Full                                                        +---------+---------------+---------+-----------+----------+--------------+  FV DistalFull                                                        +---------+---------------+---------+-----------+----------+--------------+ PFV      Full                                                        +---------+---------------+---------+-----------+----------+--------------+ POP      Full           Yes      Yes                                  +---------+---------------+---------+-----------+----------+--------------+ PTV      Full                                                        +---------+---------------+---------+-----------+----------+--------------+ PERO     Full                                                        +---------+---------------+---------+-----------+----------+--------------+     Summary: RIGHT: - There is no evidence of deep vein thrombosis in the lower extremity.  - No cystic structure found in the popliteal fossa.  LEFT: - There is no evidence of deep vein thrombosis in the lower extremity.  - No cystic structure found in the popliteal fossa.  *See table(s) above for measurements and observations. Electronically signed by Harold Barban MD on 01/21/2020 at 9:44:28 PM.    Final    VAS [name] LOWER EXTREMITY VENOUS (DVT) (ONLY [facility])  Result Date: 01/16/2020  Lower Venous DVTStudy Indications: Swelling.  Limitations: Body habitus and poor ultrasound/tissue interface. Comparison Study: No prior exam. Performing Technologist: Baldwin Crown ARDMS, RVT  Examination Guidelines: A complete evaluation includes B-mode imaging, spectral Doppler, color Doppler, and power Doppler as needed of all accessible portions of each vessel. Bilateral testing is considered an integral part of a complete examination. Limited examinations for reoccurring indications may be performed as noted. The reflux portion of the exam is performed with the patient in reverse Trendelenburg.  +---------+---------------+---------+-----------+----------+------------------+ RIGHT    CompressibilityPhasicitySpontaneityPropertiesThrombus Aging     +---------+---------------+---------+-----------+----------+------------------+ CFV      Full           Yes      Yes                                     +---------+---------------+---------+-----------+----------+------------------+ SFJ      Full                                                             +---------+---------------+---------+-----------+----------+------------------+  FV Prox  Full                                                            +---------+---------------+---------+-----------+----------+------------------+ FV Mid   Full                                                            +---------+---------------+---------+-----------+----------+------------------+ FV DistalFull                                                            +---------+---------------+---------+-----------+----------+------------------+ PFV      Full                                                            +---------+---------------+---------+-----------+----------+------------------+ POP      Full           Yes      Yes                                     +---------+---------------+---------+-----------+----------+------------------+ PTV      Full                                         visualized with                                                          color              +---------+---------------+---------+-----------+----------+------------------+ PERO     Full                                         visualized with                                                          color              +---------+---------------+---------+-----------+----------+------------------+   +---------+---------------+---------+-----------+----------+------------------+ LEFT     CompressibilityPhasicitySpontaneityPropertiesThrombus Aging     +---------+---------------+---------+-----------+----------+------------------+ CFV      Full           Yes      Yes                                     +---------+---------------+---------+-----------+----------+------------------+  SFJ      Full                                                            +---------+---------------+---------+-----------+----------+------------------+ FV Prox   Full                                                            +---------+---------------+---------+-----------+----------+------------------+ FV Mid   Full                                                            +---------+---------------+---------+-----------+----------+------------------+ FV DistalFull                                         visualized with                                                          color              +---------+---------------+---------+-----------+----------+------------------+ PFV      Full                                                            +---------+---------------+---------+-----------+----------+------------------+ POP      Full           Yes      Yes                                     +---------+---------------+---------+-----------+----------+------------------+ PTV      Full                                         visualized with                                                          color              +---------+---------------+---------+-----------+----------+------------------+ PERO     Full  visualized with                                                          color              +---------+---------------+---------+-----------+----------+------------------+     Summary: BILATERAL: - No evidence of deep vein thrombosis seen in the lower extremities, bilaterally.  RIGHT: - Portions of this examination were limited- see technologist comments above. - No cystic structure found in the popliteal fossa.  LEFT: - Portions of this examination were limited- see technologist comments above. - No cystic structure found in the popliteal fossa.  *See table(s) above for measurements and observations. Electronically signed by Monica Martinez MD on 01/16/2020 at 4:22:13 PM.    Final      ASSESSMENT:  64 y.o.  woman with stage IV breast cancer  manifested chiefly by loco-regional nodal disease (neck, chest) and skin involvement, without liver, bone, or brain metastases documented   (1) Status post right upper inner quadrant lumpectomy and sentinel lymph node sampling 03/04/2002 for 2 separate foci of invasive ductal carcinoma, mpT2 pN1 or stage IIB, both foci grade 3, both estrogen and progesterone receptor positive, both HercepTest 3+, Mib-1 56%  (2) Reexcision for margins 05/27/2002 showed no residual cancer in the breast.  (3) The patient refused adjuvant systemic therapy.  (4) Adjuvant radiation treatment completed 08/14/2002.  (5) recurrence in the right breast in 02/2004 showing a morphologically different tumor, again grade 3, again estrogen and progesterone receptor negative, with an MIB-1 of 14% and Herceptest 3+.  (5) Between 03/2004 and 07/2004 she received dose dense Doxorubicin/Cyclophosphamide x 4 given with trastuzumab, followed by weekly Docetaxel x 8, again given with trastuzumab.  (6) Right mastectomy 07/13/2004 showed scattered microscopic foci of residual disease over an area  greater than 5 cm. Margins were negative.  (7) Postoperative Docetaxel continued until 09/2004.  (8) Trastuzumab (Herceptin) given 08/2004 through 01/2012 with some brief interruptions.  RECURRENT/ STAGE IV DISEASE December 2006 (9) Isolated right cervical nodal recurrence 10/2005, treated with radiation to the right supraclavicular area (total 41.5 gray) completed 12/29/2005.  (10) Navelbine given together with Herceptin  11/2005 through 03/2006.  (9) Left mastectomy 02/13/2006 for ductal carcinoma in situ, grade 2, estrogen and progesterone receptor negative, with negative margins; 0 of 3 lymph nodes involved  (10) treated with Lapatinib and Capecitabine before 10/2009, for an unclear duration and with unclear results (cannot locate data on chart review).  (11) Status post right supraclavicular lymph node biopsy 09/2010 again  positive for an invasive ductal carcinoma, estrogen and progesterone receptor negative, HER-2 positive by CISH with a ratio 4.25.  (12) Navelbine given together with Herceptin between 05/2011 and 11/2011.  (13) Carboplatin/ Gemcitabine/ Herceptin given for 2 cycles, in 12/2011 and 01/2012.  (14) TDM-1 (Kadcyla) started 02/2012. Last dose 10/02/2013 after which the patient discontinued treatment at her own discretion. Echo on December 2014 showed a well preserved ejection fraction.  (15) Deep vein thrombosis of the right upper extremity documented 04/20/2011.  She completed anticoagulant therapy with Coumadin on 03/25/2013.  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 Left chest Port-A-Cath placed on 04/08/2013; being flushed every 6 weeks  RIGHT UPPER EXTREMITY ANGIOSARCOMA VS BREAST CANCER: August 2016 (18) treated at [facility] of [place] August 2016 with paclitaxel, trastuzumab and pertuzumab 1 cycle, afterwards referred to hospice  (19) under the care of hospice of [facility] August 2016 to 05/08/2016, when the patient opted for a second try at chemotherapy  (20) started low-dose Abraxane, trastuzumab and pertuzumab 06/07/2016, to be repeated every 4 weeks.   (a) pretreatment echocardiogram 06/06/2016 showed a 60-65% ejection fraction  (b) significant compliance problems compromise response to treatment  (c)  echocardiogram 02/20/17 LVEF 55-60%  (d) Abraxane discontinued 07/04/2017 because of possible neuropathy symptoms  (e) Abraxane resumed 09/27/2017  (f) echocardiogram 07/20/2017 showed an ejection fraction of 60-65%  (g) echo 01/22/2018 showed an ejection fraction of 50-55%  (h) CT scan of the chest 05/03/2018 shows no disease involving the  lungs liver or lymph nodes, with stable subcutaneous masses as previously noted  (I) Abraxane discontinued August 2019 because of concerns regarding neuropathy-- gemcitabine substituted  (j) echocardiogram 08/20/2018 shows an ejection fraction in the 55-60% range (done every 6 months)  (k) gemcitabine discontinued because of multiple symptoms, Abraxane restarted 09/11/2018, given once every 4 weeks  (l) echocardiogram 07/17/2019 shows a well-preserved ejection fraction at 55-60%  (m) restaging PET scan 08/20/2019 shows hypermetabolic adenopathy, subcutaneous malignancy as clinically appreciated, but no liver or lung parenchymal lesions   (21) Chronic pain secondary to known metastatic disease  (a) patient signed pain contract 10/19/2016  (b) as of 11/16/2016 receives her pain medications from Dr [name]  (c)  Dr [name] no longer able to prescribe as of February 2019  (d) all pain medications now through [facility], Dr Mirna Mires (as of April 2019)  (22) left breast and left axillary adenopathy noted clinically  (a) left axillary lymph node biopsy on 07/24/2019 benign  (23) BRAIN METASTASES: frontal and temporal lobe metastases noted on brain MRI 09/04/2019  (a) adjuvant radiation 09/11/2019-10/16/2019:    The brain was treated to 30 Gy in 10 fractions.   (b) tucatinib prescribed December 2020 at 150 mg daily because of methadone interaction, finally started March 2021.  (c) noncontrast head CT 01/12/2020 stable lesions/improved edema  (d) brain MRI 02/01/2020 shows decreased size of the right frontal and temporal lobe metastases with mild residual edema, no mass-effect, and no evidence of new mets  (24) COVID-19 infection 2019-11-19   PLAN: Adilee is currently admitted.  Rescheduling her to return for a visit and treatment in about 10 days.  [name], [name], MD  02/11/20 2:28 PM Medical Oncology and Hematology [facility] [place],  [place] [address] Tel. [phone]    Fax. [phone]   I, Wilburn Mylar, am acting as scribe for Dr. [name]. [name].  I, Lurline Del MD, have reviewed the above documentation for accuracy and completeness, and I agree with the above.   *Total Encounter Time as defined by the Centers for Medicare and Medicaid Services includes, in addition to the face-to-face time of a patient visit (documented in the note above) non-face-to-face time: obtaining and reviewing outside history, ordering and reviewing medications, tests or procedures, care coordination (communications with other health care professionals or caregivers) and documentation in the medical record.

## 2020-02-11 NOTE — Progress Notes (Signed)
PROGRESS NOTE    [name]  [id number] DOB: 06-28-1956 DOA: 02/10/2020 PCP: Chauncey Cruel, MD     Brief Narrative:  64 y.o. BF PMHx breast cancer with metastasis to the brain, hypertension,  DVT,  Admitted to [facility] on 02/10/2020 with urinary tract infection after presenting from home to [facility] Emergency Department complaining of generalized weakness.   The patient reports 1 to 2 days of generalized weakness in the absence of any acute focal neurologic deficits, including now acute focal weakness or change in sensation.  In the setting of this generalized weakness, the patient notes increased difficulty with ambulation as well as increased difficulty in performing her ADLs.  Denies any associated new headache, change in vision, dizziness, vertigo, dysphagia, dysarthria, facial droop.  She does however report mild dysuria over the last 2 days, in the absence of any gross hematuria.  Denies any subjective fever, chills, rigors, or generalized myalgias. Denies any recent headache, neck stiffness, rhinitis, rhinorrhea, sore throat, sob, cough, nausea, vomiting, abdominal pain, diarrhea, or rash. No recent traveling or known COVID-19 exposures. Denies any recent chest pain, diaphoresis, or palpitations.  The emergency department physician also discussed the patient's case with the patient's daughter, who conveyed her finding that the patient has exhibited some increased somnolence over the course of the last day, sleeping more relative to her baseline sleep requirements.  No known recent trauma.   Of note, the patient underwent COVID-19 testing in January, with a positive COVID-19 antigen test on 11/19/2019.    ED Course:  Vital signs in the ED were notable for the following: Temperature max 99.7; heart rate 91-95; blood pressure 107/75 - 134/92; respiratory rate 19-26; oxygen saturation 90 to 93% on room air.  Labs were notable for the following: CMP  notable for the following: Sodium 136, potassium 3.2, bicarbonate 25, anion gap 10, creatinine 0.9, glucose 99.  CBC notable for white blood cell count of 7500 with 73% neutrophils.  Urinalysis was associate with a cloudy specimen, greater than 50 white blood cells, many bacteria, moderate leukocyte Estrace, and no evidence of squamous epithelial cells.  Urinary drug screen is positive for benzodiazepines, opioids, and cannabis.  Chest x-ray showed no evidence of acute cardiopulmonary process, including no evidence of infiltrate, edema, effusion, or pneumothorax.  Noncontrast CT of the head to evaluate patient's daughter's report of increased somnolence, showed no evidence of acute intracranial hemorrhage.  CT head also showed areas of white matter hypodensity involving the frontal and right temporal lobes similar to that seen on most recent prior CT head from 01/16/2020.  While in the ED, the following were administered: Potassium chloride 10 mEq IV over 2 hours x 1.  Subsequently, the patient was admitted for further evaluation management of presenting urinary tract infection.    Subjective: A/O x4.  Patient states prior to last Friday was able to ambulate without assistance.  Starting Friday began to require assistance to ambulate and by Saturday unable to ambulate at all.  Patient states this was the reason for her presentation to the ED last night and is very concerned.  States having mid and lower back pain, states difficult to determine if increased in severity.  Has tumors in her mid and lower back region but to her knowledge were never invasive into her spinal column.  Did not complain of bowel or bladder incontinence.    Assessment & Plan:   Principal Problem:   UTI (urinary tract infection) Active Problems:  Essential hypertension   GERD   Metastasis to brain [facility])   Acute metabolic encephalopathy   Generalized weakness   Hypokalemia   Paresis of left lower extremity (HCC)    Paresis of right lower extremity (HCC)   Acute Lower Extremity Paresis -Patient states unable to ambulate.  Metastatic breast cancer? -4/6 MRI L-spine, T-spine, C-spine; R/O metastasis. -4/6 PT/OT consult.  Evaluate CIR vs SNF  #) Metastatic breast cancer: Documented history of metastatic disease to the brain.  On the antineoplastic agent Tucatinib.   Plan: Continue outpatient Tucatinib.   Urinary tract infection -Urinalysis suggestive of UTI -Urine culture pending -Continue empiric antibiotic until urine culture results  #) Acute metabolic encephalopathy: Increase in somnolence over the course today, as further described above.  Suspect that this is on the basis of physiologic stress stemming from presenting UTI, as above, with potential additional pharmacologic contributions from polypharmacy as well as the presence of multiple central acting medications at home, including as needed Xanax, gabapentin, methadone, as needed oxycodone, and Zoloft.  Should treatment of underlying urinary tract infection as well as holding of some of outpatient central acting medications fail to be associated with resolution of recent somnolence, could consider obtaining MRI of the brain, particular in the setting of known metastatic breast cancer to the brain.  Of note, presenting urinalysis positive for benzodiazepines as well as opioids, both of which the patient is prescribed as an outpatient, in addition to a positive for cannabis.  Plan: Work-up and management of presenting urinary tract infection, as above.  An effort to reduce potential pharmacologic confounding variables via multiple central acting medications at home, will hold the following medications for now and closely monitor for trending of subsequent mental status: As needed Xanax, gabapentin, as needed oxycodone. Will also hold home scheduled methadone overnight, as the long-acting nature of this medication should prevent any interval withdrawal.   Check TSH, VBG.  #) Recent Covid 19 infection: As it has been greater than 21 days but within 3 months since the patient's positive COVID-19 test on 11/19/2019, there is currently no indication to repeat COVID-19 testing or to maintain Airborne/Contact precautions, and I will be admitting this patient to a non-COVID unit.  The patient denies any current respiratory symptoms and does not appear to be in respiratory distress.  Plan: will refrain from airborne/contact precautions, as further described above.   #) Essential hypertension: Outpatient antihypertensive regimen consists of triamterene, HCTZ.  Systolic blood pressures in the ED have ranged from 107 - 134 mmHg.    Plan: In the setting of presenting infectious presentation, will hold blood pressure medications for now, and closely monitor ensuing blood pressure via routine vital signs.  #) GERD: On omeprazole as an outpatient.  Plan: Continue home PPI.  Hypokalemia -Potassium goal> 4 -Potassium 60 mEq  Hypomagnesmia -Magnesium goal> 2 -Magnesium IV 2 g      DVT prophylaxis: Lovenox Code Status: Full Family Communication:  Disposition Plan: TBD 1.  Where the patient is from 2.  Anticipated d/c place. 3.  Barriers to d/c resolution of patient's primary complaint bilateral lower extremity paresis in the setting of metastatic breast cancer   Consultants:    Procedures/Significant Events:    I have personally reviewed and interpreted all radiology studies and my findings are as above.  VENTILATOR SETTINGS:    Cultures 4/6 blood pending 4/6 urine pending    Antimicrobials: Anti-infectives (From admission, onward)   Start     Dose/Rate Stop   02/11/20  0130  cefTRIAXone (ROCEPHIN) 1 g in sodium chloride 0.9 % 100 mL IVPB     1 g 200 mL/hr over 30 Minutes         Devices    LINES / TUBES:      Continuous Infusions: . cefTRIAXone (ROCEPHIN)  IV 1 g (02/11/20 2229)     Objective: Vitals:    02/11/20 0604 02/11/20 1018 02/11/20 1416 02/11/20 2141  BP: 104/68 108/71 96/62 (!) 114/49  Pulse: 90 84 87 84  Resp: 20 20 20 20   Temp: 98.7 F (37.1 C) 98 F (36.7 C) 98.2 F (36.8 C) 98.6 F (37 C)  TempSrc: Oral Oral Oral Oral  SpO2: 92% 91% 100% 97%  Weight:      Height:        Intake/Output Summary (Last 24 hours) at 02/11/2020 2305 Last data filed at 02/11/2020 1839 Gross per 24 hour  Intake 315.83 ml  Output 200 ml  Net 115.83 ml   Filed Weights   02/11/20 0246  Weight: 89 kg    Examination:  General: A/O x4, no acute respiratory distress Eyes: negative scleral hemorrhage, negative anisocoria, negative icterus ENT: Negative Runny nose, negative gingival bleeding, Neck:  Negative scars, masses, torticollis, lymphadenopathy, JVD Lungs: Clear to auscultation bilaterally without wheezes or crackles Cardiovascular: Regular rate and rhythm without murmur gallop or rub normal S1 and S2 Abdomen: negative abdominal pain, nondistended, positive soft, bowel sounds, no rebound, no ascites, no appreciable mass Extremities: No significant cyanosis, clubbing, or edema bilateral lower extremities Skin: Negative rashes, lesions, ulcers Psychiatric:  Negative depression, negative anxiety, negative fatigue, negative mania  Central nervous system:  Cranial nerves II through XII intact, tongue/uvula midline, bilateral upper extremities muscle strength 5/5, bilateral lower extremity strength 3-4/5, sensation intact throughout, negative dysarthria, negative expressive aphasia, negative receptive aphasia.  .     Data Reviewed: Care during the described time interval was provided by me .  I have reviewed this patient's available data, including medical history, events of note, physical examination, and all test results as part of my evaluation.  CBC: Recent Labs  Lab 02/10/20 1552 02/11/20 0707  WBC 7.5 5.7  NEUTROABS 5.6 3.7  HGB 10.4* 9.9*  HCT 33.3* 31.5*  MCV 91.7 91.8  PLT  239 [id number]   Basic Metabolic Panel: Recent Labs  Lab 02/10/20 1552 02/11/20 0707  NA 136 139  K 3.2* 3.4*  CL 101 102  CO2 25 25  GLUCOSE 99 93  BUN 16 18  CREATININE 0.90 1.19*  CALCIUM 8.9 8.9  MG  --  1.4*   GFR: Estimated Creatinine Clearance: 54.7 mL/min (A) (by C-G formula based on SCr of 1.19 mg/dL (H)). Liver Function Tests: Recent Labs  Lab 02/10/20 1552 02/11/20 0707  AST 51* 44*  ALT 26 24  ALKPHOS 89 79  BILITOT 0.8 0.4  PROT 7.1 6.6  ALBUMIN 3.0* 2.8*   No results for input(s): LIPASE, AMYLASE in the last 168 hours. No results for input(s): AMMONIA in the last 168 hours. Coagulation Profile: No results for input(s): INR, PROTIME in the last 168 hours. Cardiac Enzymes: No results for input(s): CKTOTAL, CKMB, CKMBINDEX, TROPONINI in the last 168 hours. BNP (last 3 results) No results for input(s): PROBNP in the last 8760 hours. HbA1C: No results for input(s): HGBA1C in the last 72 hours. CBG: No results for input(s): GLUCAP in the last 168 hours. Lipid Profile: No results for input(s): CHOL, HDL, LDLCALC, TRIG, CHOLHDL, LDLDIRECT in the last 72  hours. Thyroid Function Tests: Recent Labs    02/11/20 0707  TSH 0.348*   Anemia Panel: No results for input(s): VITAMINB12, FOLATE, FERRITIN, TIBC, IRON, RETICCTPCT in the last 72 hours. Sepsis Labs: No results for input(s): PROCALCITON, LATICACIDVEN in the last 168 hours.  No results found for this or any previous visit (from the past 240 hour(s)).       Radiology Studies: DG Chest 2 View  Result Date: 02/10/2020 CLINICAL DATA:  Altered mental status. EXAM: CHEST - 2 VIEW COMPARISON:  January 25, 2020 FINDINGS: There is a well-positioned left-sided Port-A-Cath. The heart size is stable. Areas of scarring and atelectasis are noted bilaterally. There is no pneumothorax. There are degenerative changes throughout the visualized thoracic spine. IMPRESSION: No active cardiopulmonary disease. Electronically  Signed   By: Constance Holster [name]   On: 02/10/2020 23:19   CT Head Wo Contrast  Result Date: 02/10/2020 CLINICAL DATA:  64 year old female with headache. History of metastatic breast cancer. EXAM: CT HEAD WITHOUT CONTRAST TECHNIQUE: Contiguous axial images were obtained from the base of the skull through the vertex without intravenous contrast. COMPARISON:  Head CT dated 01/16/2020. FINDINGS: Brain: The ventricles and sulci appropriate size for patient's age. Areas of white matter hypodensity involving the inferior right frontal and right temporal lobes similar to prior CT in keeping with metastatic disease. There is no acute intracranial hemorrhage. No mass effect or midline shift. No extra-axial fluid collection. Vascular: No hyperdense vessel or unexpected calcification. Skull: Normal. Negative for fracture or focal lesion. Sinuses/Orbits: The visualized paranasal sinuses and the right mastoid air cells are clear. Mild left mastoid effusions. No air-fluid level. Other: None IMPRESSION: 1. No acute intracranial hemorrhage. 2. Similar appearance of right frontal and temporal white matter hypodensity in keeping with metastatic disease. Electronically Signed   By: Anner Crete [name]   On: 02/10/2020 23:10   MR THORACIC SPINE WO CONTRAST  Result Date: 02/11/2020 CLINICAL DATA:  64 year old female with metastatic breast cancer. Back pain. A total spine MRI without and with contrast was planned, but the patient was only able to tolerate a noncontrast thoracic exam due to pain. EXAM: MRI THORACIC SPINE WITHOUT CONTRAST TECHNIQUE: Multiplanar, multisequence MR imaging of the thoracic spine was performed. No intravenous contrast was administered. COMPARISON:  CT cervical spine 11/19/2019. 08/20/2019 PET-CT. 05/03/2018 chest CT. FINDINGS: Limited cervical spine imaging: Straightening of cervical lordosis appears stable since January. Grossly normal bone marrow signal in the visible cervical spine. Thoracic spine  segmentation:  Appears normal. Alignment:  Preserved thoracic kyphosis. No spondylolisthesis. Vertebrae: Thoracic bone marrow signal is within normal limits. No marrow edema or evidence of acute osseous abnormality. No suspicious thoracic vertebral lesion identified. However, there are questionable posterior rib lesions, such as on the right at the T6 and T8 levels series 21, images 19 and 25. Cord: Capacious thoracic spinal canal. Thoracic spinal cord and conus medullaris appear within normal limits. The conus is at T12-L1. Paraspinal and other soft tissues: Chronic superficial skin and soft tissue nodularity to the right of midline from the T1 through T8 thoracic levels (series 21, image 13 and series 17, image 7). This appears grossly stable since 2019 and may be chronic scarring. However, there was described hypermetabolism here on a 08/20/2019 PET-CT Other thoracic paraspinal soft tissues are within normal limits. There is mildly heterogeneous signal in the visible left lung including a 12 mm nodular area on series 20, image 15. Trace right pleural effusion. Negative visible upper abdominal viscera. Disc levels:  Mild for age thoracic spine degeneration with no spinal stenosis or neural foraminal stenosis. IMPRESSION: 1. No metastatic disease identified in the noncontrast thoracic spine. Mild for age thoracic spine degeneration with no spinal stenosis or neural impingement. 2. Questionable posterior rib lesions at the T6 and T8 levels. Consider follow-up Chest CT or Bone Scan. 3. Indeterminate left lung nodularity also, for which repeat chest CT would be most valuable. 4. Indeterminate superficial skin lesions over the right posterior thorax, roughly stable since 2019 but also described by PET-CT in October. Electronically Signed   By: Genevie Ann [name]   On: 02/11/2020 21:59        Scheduled Meds: . allopurinol  300 mg Oral Daily  . Chlorhexidine Gluconate Cloth  6 each Topical Daily  . multivitamin with  minerals  1 tablet Oral Daily  . pantoprazole  40 mg Oral Daily  . potassium chloride SA  20 mEq Oral Daily  . sertraline  100 mg Oral Daily  . sodium chloride flush  3 mL Intravenous Q12H   Continuous Infusions: . cefTRIAXone (ROCEPHIN)  IV 1 g (02/11/20 2229)     LOS: 0 days    Time spent:40 min    Makala Fetterolf, Geraldo Docker, MD [facility] Pager [phone]  If 7PM-7AM, please contact night-coverage [email or web address] Password [facility] 02/11/2020, 11:05 PM

## 2020-02-12 ENCOUNTER — Other Ambulatory Visit: Payer: Medicare Other

## 2020-02-12 ENCOUNTER — Inpatient Hospital Stay (HOSPITAL_COMMUNITY): Payer: Medicare Other

## 2020-02-12 ENCOUNTER — Ambulatory Visit: Payer: Medicare Other

## 2020-02-12 ENCOUNTER — Inpatient Hospital Stay: Payer: Medicare Other | Attending: Oncology

## 2020-02-12 ENCOUNTER — Inpatient Hospital Stay (HOSPITAL_BASED_OUTPATIENT_CLINIC_OR_DEPARTMENT_OTHER): Payer: Medicare Other | Admitting: Oncology

## 2020-02-12 ENCOUNTER — Ambulatory Visit: Payer: Medicare Other | Admitting: Oncology

## 2020-02-12 ENCOUNTER — Inpatient Hospital Stay: Payer: Medicare Other

## 2020-02-12 ENCOUNTER — Other Ambulatory Visit: Payer: Self-pay | Admitting: Oncology

## 2020-02-12 DIAGNOSIS — K2100 Gastro-esophageal reflux disease with esophagitis, without bleeding: Secondary | ICD-10-CM

## 2020-02-12 DIAGNOSIS — C4911 Malignant neoplasm of connective and soft tissue of right upper limb, including shoulder: Secondary | ICD-10-CM

## 2020-02-12 DIAGNOSIS — C7931 Secondary malignant neoplasm of brain: Secondary | ICD-10-CM

## 2020-02-12 DIAGNOSIS — C778 Secondary and unspecified malignant neoplasm of lymph nodes of multiple regions: Secondary | ICD-10-CM | POA: Insufficient documentation

## 2020-02-12 DIAGNOSIS — C792 Secondary malignant neoplasm of skin: Secondary | ICD-10-CM | POA: Insufficient documentation

## 2020-02-12 DIAGNOSIS — Z5112 Encounter for antineoplastic immunotherapy: Secondary | ICD-10-CM | POA: Insufficient documentation

## 2020-02-12 DIAGNOSIS — Z923 Personal history of irradiation: Secondary | ICD-10-CM | POA: Insufficient documentation

## 2020-02-12 DIAGNOSIS — N390 Urinary tract infection, site not specified: Secondary | ICD-10-CM

## 2020-02-12 DIAGNOSIS — C50211 Malignant neoplasm of upper-inner quadrant of right female breast: Secondary | ICD-10-CM | POA: Insufficient documentation

## 2020-02-12 DIAGNOSIS — Z171 Estrogen receptor negative status [ER-]: Secondary | ICD-10-CM | POA: Insufficient documentation

## 2020-02-12 LAB — CBC WITH DIFFERENTIAL/PLATELET
Abs Immature Granulocytes: 0.15 10*3/uL — ABNORMAL HIGH (ref 0.00–0.07)
Basophils Absolute: 0.1 10*3/uL (ref 0.0–0.1)
Basophils Relative: 1 %
Eosinophils Absolute: 0.1 10*3/uL (ref 0.0–0.5)
Eosinophils Relative: 3 %
HCT: 31.3 % — ABNORMAL LOW (ref 36.0–46.0)
Hemoglobin: 9.7 g/dL — ABNORMAL LOW (ref 12.0–15.0)
Immature Granulocytes: 4 %
Lymphocytes Relative: 18 %
Lymphs Abs: 0.7 10*3/uL (ref 0.7–4.0)
MCH: 28 pg (ref 26.0–34.0)
MCHC: 31 g/dL (ref 30.0–36.0)
MCV: 90.5 fL (ref 80.0–100.0)
Monocytes Absolute: 0.7 10*3/uL (ref 0.1–1.0)
Monocytes Relative: 19 %
Neutro Abs: 2 10*3/uL (ref 1.7–7.7)
Neutrophils Relative %: 55 %
Platelets: 245 10*3/uL (ref 150–400)
RBC: 3.46 MIL/uL — ABNORMAL LOW (ref 3.87–5.11)
RDW: 16.7 % — ABNORMAL HIGH (ref 11.5–15.5)
WBC: 3.7 10*3/uL — ABNORMAL LOW (ref 4.0–10.5)
nRBC: 0 % (ref 0.0–0.2)

## 2020-02-12 LAB — COMPREHENSIVE METABOLIC PANEL
ALT: 21 U/L (ref 0–44)
AST: 37 U/L (ref 15–41)
Albumin: 2.8 g/dL — ABNORMAL LOW (ref 3.5–5.0)
Alkaline Phosphatase: 77 U/L (ref 38–126)
Anion gap: 10 (ref 5–15)
BUN: 17 mg/dL (ref 8–23)
CO2: 26 mmol/L (ref 22–32)
Calcium: 9.1 mg/dL (ref 8.9–10.3)
Chloride: 104 mmol/L (ref 98–111)
Creatinine, Ser: 0.84 mg/dL (ref 0.44–1.00)
GFR calc Af Amer: >60 mL/min (ref >60)
GFR calc non Af Amer: >60 mL/min (ref >60)
Glucose, Bld: 98 mg/dL (ref 70–99)
Potassium: 3.6 mmol/L (ref 3.5–5.1)
Sodium: 140 mmol/L (ref 135–145)
Total Bilirubin: 0.3 mg/dL (ref 0.3–1.2)
Total Protein: 6.7 g/dL (ref 6.5–8.1)

## 2020-02-12 LAB — MAGNESIUM: Magnesium: 1.4 mg/dL — ABNORMAL LOW (ref 1.7–2.4)

## 2020-02-12 LAB — T4, FREE: Free T4: 0.87 ng/dL (ref 0.61–1.12)

## 2020-02-12 LAB — PHOSPHORUS: Phosphorus: 3.1 mg/dL (ref 2.5–4.6)

## 2020-02-12 MED ORDER — ACETAMINOPHEN 325 MG PO TABS
650.0000 mg | ORAL_TABLET | Freq: Four times a day (QID) | ORAL | Status: DC | PRN
  Administered 2020-02-13: 650 mg via ORAL
  Filled 2020-02-12 (×2): qty 2

## 2020-02-12 MED ORDER — ACETAMINOPHEN 650 MG RE SUPP: 650.0000 mg | Freq: Four times a day (QID) | RECTAL | Status: DC | PRN

## 2020-02-12 MED ORDER — MAGNESIUM SULFATE 50 % IJ SOLN
3.0000 g | Freq: Once | INTRAVENOUS | Status: AC
  Administered 2020-02-12: 3 g via INTRAVENOUS
  Filled 2020-02-12: qty 6

## 2020-02-12 MED ORDER — LORAZEPAM 2 MG/ML IJ SOLN
1.0000 mg | Freq: Once | INTRAMUSCULAR | Status: AC
  Administered 2020-02-12: 1 mg via INTRAVENOUS
  Filled 2020-02-12: qty 1

## 2020-02-12 NOTE — Progress Notes (Signed)
Lovenox per Pharmacy for DVT Prophylaxis    Pharmacy has been consulted from dosing enoxaparin (lovenox) in this patient for DVT prophylaxis.  The pharmacist has reviewed pertinent labs (Hgb _9.9__; PLT_237__), patient weight (__59_kg) and renal function (CrCl__54_mL/min) and decided that enoxaparin 40__mg SQ Q24Hrs is appropriate for this patient.  The pharmacy department will sign off at this time.  Please reconsult pharmacy if status changes or for further issues.  Thank you  Cyndia Diver PharmD, BCPS  02/12/2020, 1:22 AM

## 2020-02-12 NOTE — Evaluation (Signed)
Occupational Therapy Evaluation Patient Details Name: [name] MRN: [id number] DOB: May 16, 1956 Today's Date: 02/12/2020    History of Present Illness 64 yo female admitted with UTI, weakness, fall at home. Hx of met breast cancer s/p R mastectomy, on radiation, chronic pain, DVT, COVID, polyneuropathy   Clinical Impression   PTA patient reports independent with ADLs.  Admitted for above and limited by problem list below, including impaired cognition, decreased activity tolerance, generalized weakness, and impaired balance. Overall requires min assist for ADLs and transfers, requires increased time and cueing for safety/awareness.  Reports 2001, but able to voice correct president; noted decreased awareness of deficits and requires verbal cueing.  She is motivated to [employer] home, and reports having support from her spouse and 3 daughters but it is unclear if she has 24/7 support (she also reports an aide 4 hrs/day, but they "haven't started yet").  Believe she will best benefit from continued OT services while admitted and after dc at [facility] level if she has 24/7 support to optimize independence, safety with ADLs and mobility.     Follow Up Recommendations  Home health OT;Supervision/Assistance - 24 hour(may need SNF if doesn't have 24/7 support)    Equipment Recommendations  None recommended by OT    Recommendations for Other Services       Precautions / Restrictions Precautions Precautions: Fall Restrictions Weight Bearing Restrictions: No      Mobility Bed Mobility Overal bed mobility: Needs Assistance Bed Mobility: Supine to Sit;Sit to Supine     Supine to sit: Min assist;HOB elevated Sit to supine: Min guard   General bed mobility comments: min assist for trunk support and cueing to utilize rail; increased time and effort  Transfers Overall transfer level: Needs assistance Equipment used: 1 person hand held assist Transfers: Sit to/from [facility] Sit to  Stand: Min assist;From elevated surface Stand pivot transfers: Min guard       General transfer comment: min assist to power up and steady to/from [facility]     Balance Overall balance assessment: Needs assistance Sitting-balance support: No upper extremity supported;Feet supported Sitting balance-Leahy Scale: Fair     Standing balance support: Single extremity supported;During functional activity;No upper extremity supported Standing balance-Leahy Scale: Poor Standing balance comment: relaint on external support during ADLs                            ADL either performed or assessed with clinical judgement   ADL Overall ADL's : Needs assistance/impaired     Grooming: Set up;Sitting   Upper Body Bathing: Set up;Sitting   Lower Body Bathing: Minimal assistance;Sit to/from stand   Upper Body Dressing : Set up;Sitting   Lower Body Dressing: Minimal assistance;Sit to/from stand   Toilet Transfer: Minimal assistance;Stand-pivot;BSC   Toileting- Clothing Manipulation and Hygiene: Minimal assistance;Sit to/from stand       Functional mobility during ADLs: Minimal assistance General ADL Comments: pt limited by weakness, balance, activity tolerance     Vision   Vision Assessment?: No apparent visual deficits     Perception     Praxis      Pertinent Vitals/Pain Pain Assessment: Faces Faces Pain Scale: Hurts even more Pain Location: back Pain Descriptors / Indicators: Discomfort;Sore Pain Intervention(s): Monitored during session;Repositioned;Limited activity within patient's tolerance     Hand Dominance     Extremity/Trunk Assessment Upper Extremity Assessment Upper Extremity Assessment: Generalized weakness   Lower Extremity Assessment Lower Extremity Assessment: Defer to  PT evaluation   Cervical / Trunk Assessment Cervical / Trunk Assessment: Normal   Communication     Cognition Arousal/Alertness: Awake/alert Behavior During Therapy: WFL for  tasks assessed/performed Overall Cognitive Status: Impaired/Different from baseline Area of Impairment: Orientation;Memory;Safety/judgement;Awareness;Problem solving                 Orientation Level: Disoriented to;Time(reports 2001, but correct president )   Memory: Decreased short-term memory   Safety/Judgement: Decreased awareness of safety;Decreased awareness of deficits Awareness: Emergent Problem Solving: Slow processing;Requires verbal cues General Comments: some poor insight to deficits at times, mild confusion    General Comments       Exercises     Shoulder Instructions      Home Living Family/patient expects to be discharged to:: Private residence Living Arrangements: Spouse/significant other;Children(daughters (3) ) Available Help at Discharge: Family;Available PRN/intermittently(reports "most of the time" ) Type of Home: House Home Access: Stairs to enter [employer] of Steps: 2   Home Layout: One level     Bathroom Shower/Tub: Teacher, early years/pre: Standard     Home Equipment: Marine scientist - single point   Additional Comments: also reports has aide 4 hours a day, then reports they haven't started yet; poor historian       Prior Functioning/Environment Level of Independence: Independent with assistive device(s)        Comments: reports using cane for mobility, completing ADLs independently        OT Problem List: Decreased strength;Decreased activity tolerance;Impaired balance (sitting and/or standing);Decreased cognition;Decreased safety awareness;Decreased knowledge of use of DME or AE;Pain;Obesity;Decreased knowledge of precautions      OT Treatment/Interventions: Self-care/ADL training;DME and/or AE instruction;Energy conservation;Therapeutic activities;Cognitive remediation/compensation;Balance training;Patient/family education;Therapeutic exercise    OT Goals(Current goals can be found in the care plan  section) Acute Rehab OT Goals Patient Stated Goal: to feel better and get home  OT Goal Formulation: With patient Time For Goal Achievement: 02/26/20 Potential to Achieve Goals: Good  OT Frequency: Min 2X/week   Barriers to D/C:            Co-evaluation              AM-PAC OT "6 Clicks" Daily Activity     Outcome Measure Help from another person eating meals?: A Little Help from another person taking care of personal grooming?: A Little Help from another person toileting, which includes using toliet, bedpan, or urinal?: A Little Help from another person bathing (including washing, rinsing, drying)?: A Little Help from another person to put on and taking off regular upper body clothing?: A Little Help from another person to put on and taking off regular lower body clothing?: A Little 6 Click Score: 18   End of Session Nurse Communication: Mobility status;Other (comment)(+ BM)  Activity Tolerance: Patient tolerated treatment well Patient left: in bed;with call bell/phone within reach;with bed alarm set  OT Visit Diagnosis: Other abnormalities of gait and mobility (R26.89);Muscle weakness (generalized) (M62.81)                Time: 6333-5456 OT Time Calculation (min): 26 min Charges:  OT General Charges $OT Visit: 1 Visit OT Evaluation $OT Eval Moderate Complexity: 1 Mod OT Treatments $Self Care/Home Management : 8-22 mins  Jolaine Artist, OT Acute Rehabilitation Services Pager [phone] Office [phone]   Delight Stare 02/12/2020, 9:33 AM

## 2020-02-12 NOTE — Progress Notes (Signed)
[facility]                                                                              Patient Demographics  [name], is a 64 y.o. female, DOB - 05-Jul-1956, [id number]  Admit date - 02/10/2020   Admitting Physician Rhetta Mura, DO  Outpatient Primary MD for the patient is [name], [name], MD  Outpatient specialists:   LOS - 1  days   Medical records reviewed and are as summarized below:    Chief Complaint  Patient presents with  . Leg Pain       Brief summary   64 y.o.BF PMHx breast cancer with metastasis to the brain, hypertension, DVT, presented with generalized weakness, increasing difficulty with ambulation, ADLs.  Patient reported dysuria for 2 days.  No focal neurological deficits.   No fevers or chills.  Patient also had exhibited some increased somnolence prior to admission. Of note, the patient underwent COVID-19 testing in January, with a positive COVID-19 antigen test on 11/19/2019. In ED, UA positive for UTI.  CT head negative for any acute stroke.   Assessment & Plan    Principal Problem:   UTI (urinary tract infection) -Urine culture and sensitivity showed gram-negative rods, follow cultures and sensitivities  Active Problems: Generalized weakness, lower extremity weakness -Patient has underlying history of metastatic breast cancer, follow MRI of the lumbar spine, cervical spine.  No metastasis noted on the T-spine. -PT recommended skilled nursing facility   Acute metabolic encephalopathy -Patient had increased somnolence at the time of admission, however currently alert and oriented. -UA positive for UTI, UDS positive for benzodiazepines, opiates, THC -Patient is also on multiple psych medications including Xanax, gabapentin, methadone, as needed oxycodone, Zoloft -TSH 0.34, free T4 0.87  Essential hypertension BP currently soft, hold triamterene HCTZ  GERD Continue PPI  History of breast CA, right -Oncology  following, discussed with Dr. Jana Hakim today, appreciate care and assistance   Code Status: Full CODE STATUS DVT Prophylaxis:  Lovenox  Family Communication: Discussed all imaging results, lab results, explained to the patient   Disposition Plan: Patient from home, will likely need home health PT OT if family can provide 24/7 supervision versus SNF   Time Spent in minutes   *35 minutes**   Procedures:  None  Consultants:   Oncology  Antimicrobials:   Anti-infectives (From admission, onward)   Start     Dose/Rate Route Frequency Ordered Stop   02/11/20 0130  cefTRIAXone (ROCEPHIN) 1 g in sodium chloride 0.9 % 100 mL IVPB     1 g 200 mL/hr over 30 Minutes Intravenous Daily at bedtime 02/11/20 0113            Medications  Scheduled Meds: . allopurinol  300 mg Oral Daily  . Chlorhexidine Gluconate Cloth  6 each Topical Daily  . enoxaparin (LOVENOX) injection  40 mg Subcutaneous QHS  . LORazepam  1 mg Intravenous Once  . multivitamin with minerals  1 tablet Oral Daily  . pantoprazole  40 mg Oral Daily  . potassium chloride SA  20 mEq Oral Daily  . sertraline  100 mg Oral Daily  .  sodium chloride flush  3 mL Intravenous Q12H   Continuous Infusions: . cefTRIAXone (ROCEPHIN)  IV 1 g (02/11/20 2229)  . magnesium sulfate LVP 250-500 ml 3 g (02/12/20 1421)   PRN Meds:.acetaminophen **OR** acetaminophen, ondansetron (ZOFRAN) IV, sodium chloride flush      Subjective:   [name] was seen and examined today.  No acute complaints, weakness slightly better today.  Much more alert and awake and oriented.  Denies any chest pain, shortness of breath, abdominal pain.  No fevers or chills Objective:   Vitals:   02/11/20 1018 02/11/20 1416 02/11/20 2141 02/12/20 0645  BP: 108/71 96/62 (!) 114/49 99/61  Pulse: 84 87 84 85  Resp: 20 20 20 18   Temp: 98 F (36.7 C) 98.2 F (36.8 C) 98.6 F (37 C) 99.2 F (37.3 C)  TempSrc: Oral Oral Oral Oral  SpO2: 91% 100% 97% 100%   Weight:      Height:        Intake/Output Summary (Last 24 hours) at 02/12/2020 1451 Last data filed at 02/12/2020 0700 Gross per 24 hour  Intake 340 ml  Output 450 ml  Net -110 ml     Wt Readings from Last 3 Encounters:  02/11/20 89 kg  02/03/20 89.7 kg  01/16/20 98 kg     Exam  General: Alert and oriented x 3, NAD  Cardiovascular: S1 S2 auscultated, no murmurs, RRR  Respiratory: Clear to auscultation bilaterally, no wheezing, rales or rhonchi  Gastrointestinal: Soft, nontender, nondistended, + bowel sounds  Ext: no pedal edema bilaterally  Neuro: No new deficits, both lower extremities 3-4/5, bilateral upper extremities 5/5  Musculoskeletal: No digital cyanosis, clubbing  Skin: No rashes  Psych: Normal affect and demeanor, alert and oriented x3    Data Reviewed:  I have personally reviewed following labs and imaging studies  Micro Results Recent Results (from the past 240 hour(s))  Culture, blood (Routine X 2) w Reflex to ID Panel     Status: None (Preliminary result)   Collection Time: 02/11/20  1:13 AM   Specimen: BLOOD  Result Value Ref Range Status   Specimen Description   Final    BLOOD PORTA CATH Performed at [facility], [place] [address], [place], [place] [address]    Special Requests   Final    BOTTLES DRAWN AEROBIC AND ANAEROBIC Blood Culture adequate volume Performed at [place] [address], [place], [place] [address]    Culture   Final    NO GROWTH 1 DAY Performed at [facility], [place] [address], [place], [place] [address]    Report Status PENDING  Incomplete  Culture, Urine     Status: Abnormal (Preliminary result)   Collection Time: 02/11/20  1:18 AM   Specimen: Urine, Clean Catch  Result Value Ref Range Status   Specimen Description   Final    URINE, CLEAN CATCH Performed at [facility], [place] [address], [place], [place] [address]    Special Requests   Final     NONE Performed at [facility], [place] [address], [place], [place] [address]    Culture (A)  Final    >=100,000 COLONIES/mL PROTEUS MIRABILIS SUSCEPTIBILITIES TO FOLLOW Performed at [facility], [place] [address], [place], [place] [address]    Report Status PENDING  Incomplete  Culture, blood (Routine X 2) w Reflex to ID Panel     Status: None (Preliminary result)   Collection Time:  02/11/20  4:55 AM   Specimen: BLOOD  Result Value Ref Range Status   Specimen Description   Final    BLOOD LEFT ARM Performed at [place] [address], [place], [place] [address]    Special Requests   Final    BOTTLES DRAWN AEROBIC ONLY Blood Culture results may not be optimal due to an inadequate volume of blood received in culture bottles Performed at [place] [address], [place], [place] [address]    Culture   Final    NO GROWTH 1 DAY Performed at [facility], [place] [address], [place], [name] [address]    Report Status PENDING  Incomplete    Radiology Reports DG Chest 2 View  Result Date: 02/10/2020 CLINICAL DATA:  Altered mental status. EXAM: CHEST - 2 VIEW COMPARISON:  January 25, 2020 FINDINGS: There is a well-positioned left-sided Port-A-Cath. The heart size is stable. Areas of scarring and atelectasis are noted bilaterally. There is no pneumothorax. There are degenerative changes throughout the visualized thoracic spine. IMPRESSION: No active cardiopulmonary disease. Electronically Signed   By: Constance Holster [name]   On: 02/10/2020 23:19   DG Chest 2 View  Result Date: 01/25/2020 CLINICAL DATA:  Cough and shortness of breath. History of breast cancer. Patient is unsteady on her feet. Patient has difficulty holding still. EXAM: CHEST - 2 VIEW COMPARISON:  01/21/2020 FINDINGS: Patient's LEFT-sided power port, tip to the superior vena cava. Heart is UPPER limits normal. There are patchy infiltrates in the lungs  persist, but there has been some improvement in aeration accounting for differences in technique. No pleural effusions. RIGHT axillary node dissection. Patient motion artifact. IMPRESSION: Persistent but improved bilateral infiltrates. Electronically Signed   By: Nolon Nations [name]   On: 01/25/2020 16:54   DG Chest 2 View  Result Date: 01/21/2020 CLINICAL DATA:  Leg swelling. Additional history provided: Bilateral lower extremity swelling, right arm swelling, history of breast cancer, multiple falls. EXAM: CHEST - 2 VIEW COMPARISON:  Chest radiographs 01/16/2020 and earlier FINDINGS: Stable position of a left chest infusion port catheter with tip projecting in the region of the right atrium. Heart size at the upper limits of normal, unchanged. Bilateral interstitial and airspace opacities, more conspicuous as compared to prior exam 01/16/2020. No evidence of pleural effusion or pneumothorax. Thoracic spondylosis. IMPRESSION: Bilateral interstitial and ill-defined airspace opacities, more conspicuous as compared to prior exam 01/16/2020. Findings may reflect pulmonary edema. Infection cannot be excluded. Electronically Signed   By: Kellie Simmering DO   On: 01/21/2020 11:42   DG Chest 2 View  Result Date: 01/16/2020 CLINICAL DATA:  Hypoxia and lower extremity edema EXAM: CHEST - 2 VIEW COMPARISON:  November 19, 2019. FINDINGS: Port-A-Cath tip is at the cavoatrial junction. No pneumothorax. Lungs are clear. Heart is upper normal in size with pulmonary vascularity normal. No adenopathy. There is degenerative change in the thoracic spine. IMPRESSION: Port-A-Cath tip at cavoatrial junction. Lungs clear. Cardiac silhouette within normal limits. Electronically Signed   By: Lowella Grip III [name]   On: 01/16/2020 12:51   DG Forearm Right  Result Date: 01/21/2020 CLINICAL DATA:  Right arm pain and swelling EXAM: RIGHT FOREARM - 2 VIEW COMPARISON:  None. FINDINGS: There is no evidence of fracture or other focal  bone lesions. No cortical destruction or periostitis. Diffuse circumferential soft tissue swelling is evident throughout the forearm. No soft tissue gas or radiopaque foreign body is seen. IMPRESSION: Diffuse circumferential soft  tissue swelling throughout the forearm. No acute bony abnormality. Electronically Signed   By: Davina Poke D.O.   On: 01/21/2020 11:44   DG Tibia/Fibula Left  Result Date: 01/21/2020 CLINICAL DATA:  Left leg pain EXAM: LEFT TIBIA AND FIBULA - 2 VIEW COMPARISON:  12/29/2010 FINDINGS: There is no evidence of fracture or other focal bone lesions. No cortical destruction or periostitis. Mineralization is again noted within the soft tissues medial to the mid tibial diaphysis, unchanged from prior study 12/29/2010. Soft tissues are otherwise unremarkable. IMPRESSION: Negative. Electronically Signed   By: Davina Poke D.O.   On: 01/21/2020 11:45   CT Head Wo Contrast  Result Date: 02/10/2020 CLINICAL DATA:  64 year old female with headache. History of metastatic breast cancer. EXAM: CT HEAD WITHOUT CONTRAST TECHNIQUE: Contiguous axial images were obtained from the base of the skull through the vertex without intravenous contrast. COMPARISON:  Head CT dated 01/16/2020. FINDINGS: Brain: The ventricles and sulci appropriate size for patient's age. Areas of white matter hypodensity involving the inferior right frontal and right temporal lobes similar to prior CT in keeping with metastatic disease. There is no acute intracranial hemorrhage. No mass effect or midline shift. No extra-axial fluid collection. Vascular: No hyperdense vessel or unexpected calcification. Skull: Normal. Negative for fracture or focal lesion. Sinuses/Orbits: The visualized paranasal sinuses and the right mastoid air cells are clear. Mild left mastoid effusions. No air-fluid level. Other: None IMPRESSION: 1. No acute intracranial hemorrhage. 2. Similar appearance of right frontal and temporal white matter  hypodensity in keeping with metastatic disease. Electronically Signed   By: Anner Crete [name]   On: 02/10/2020 23:10   CT Head Wo Contrast  Result Date: 01/16/2020 CLINICAL DATA:  Altered mental status. Positive COVID-19 test last month. Breast cancer metastatic to the brain. EXAM: CT HEAD WITHOUT CONTRAST TECHNIQUE: Contiguous axial images were obtained from the base of the skull through the vertex without intravenous contrast. COMPARISON:  01/12/2020. Brain MR dated 09/04/2019. FINDINGS: Brain: Stable areas of low density in the right frontal and temporal lobes at the locations of the patient's known masses. Stable mild patchy white matter low density in both cerebral hemispheres. No intracranial hemorrhage or CT evidence of acute infarction. No mass effect. Vascular: No hyperdense vessel or unexpected calcification. Skull: Normal. Negative for fracture or focal lesion. Sinuses/Orbits: Unremarkable Other: None. IMPRESSION: 1. No acute abnormality. 2. Stable areas of low density in the right frontal and temporal lobes at the locations of the patient's known masses compatible with metastases. 3. Stable mild chronic small vessel white matter ischemic changes in both cerebral hemispheres. Electronically Signed   By: Claudie Revering [name]   On: 01/16/2020 12:33   MR Brain W Wo Contrast  Result Date: 02/01/2020 CLINICAL DATA:  Follow-up breast cancer with brain metastases. Status post radiation therapy 11-10/2019. EXAM: MRI HEAD WITHOUT AND WITH CONTRAST TECHNIQUE: Multiplanar, multiecho pulse sequences of the brain and surrounding structures were obtained without and with intravenous contrast. CONTRAST:  7.61mL GADAVIST GADOBUTROL 1 MMOL/ML IV SOLN COMPARISON:  Head CT 01/16/2020 and MRI 09/04/2019 FINDINGS: The study is mildly motion degraded. Brain: A 2.8 x 2.0 x 1.9 cm (AP x transverse x craniocaudal) heterogeneously enhancing right frontal mass and a 4.9 x 2.4 x 2.0 cm right temporal mass have both decreased  in size from the prior MRI with both appearing partially cystic. There is persistent mild edema surrounding the right temporal lesion, while right frontal lobe edema has resolved. No new enhancing lesion is identified.  There is no acute infarct, midline shift, or extra-axial fluid collection. Small foci of T2 hyperintensity in the cerebral white matter bilaterally have mildly increased and are nonspecific but compatible with mild chronic small vessel ischemic disease. There is a mildly expanded partially empty sella. There is mild cerebral atrophy. Vascular: Major intracranial vascular flow voids are preserved. Skull and upper cervical spine: No destructive skull lesion. Diffusely diminished bone marrow T1 signal intensity in the included cervical spine, nonspecific but may be related to the patient's known anemia. Sinuses/Orbits: Unremarkable orbits. Clear paranasal sinuses. Small left mastoid effusion. Other: None. IMPRESSION: 1. Decreased size of right frontal and right temporal lobe metastases. Mild residual right temporal edema without significant mass effect. 2. No evidence of new intracranial metastases or acute abnormality. Electronically Signed   By: Logan Bores [name]   On: 02/01/2020 16:44   MR THORACIC SPINE WO CONTRAST  Result Date: 02/11/2020 CLINICAL DATA:  64 year old female with metastatic breast cancer. Back pain. A total spine MRI without and with contrast was planned, but the patient was only able to tolerate a noncontrast thoracic exam due to pain. EXAM: MRI THORACIC SPINE WITHOUT CONTRAST TECHNIQUE: Multiplanar, multisequence MR imaging of the thoracic spine was performed. No intravenous contrast was administered. COMPARISON:  CT cervical spine 11/19/2019. 08/20/2019 PET-CT. 05/03/2018 chest CT. FINDINGS: Limited cervical spine imaging: Straightening of cervical lordosis appears stable since January. Grossly normal bone marrow signal in the visible cervical spine. Thoracic spine segmentation:   Appears normal. Alignment:  Preserved thoracic kyphosis. No spondylolisthesis. Vertebrae: Thoracic bone marrow signal is within normal limits. No marrow edema or evidence of acute osseous abnormality. No suspicious thoracic vertebral lesion identified. However, there are questionable posterior rib lesions, such as on the right at the T6 and T8 levels series 21, images 19 and 25. Cord: Capacious thoracic spinal canal. Thoracic spinal cord and conus medullaris appear within normal limits. The conus is at T12-L1. Paraspinal and other soft tissues: Chronic superficial skin and soft tissue nodularity to the right of midline from the T1 through T8 thoracic levels (series 21, image 13 and series 17, image 7). This appears grossly stable since 2019 and may be chronic scarring. However, there was described hypermetabolism here on a 08/20/2019 PET-CT Other thoracic paraspinal soft tissues are within normal limits. There is mildly heterogeneous signal in the visible left lung including a 12 mm nodular area on series 20, image 15. Trace right pleural effusion. Negative visible upper abdominal viscera. Disc levels: Mild for age thoracic spine degeneration with no spinal stenosis or neural foraminal stenosis. IMPRESSION: 1. No metastatic disease identified in the noncontrast thoracic spine. Mild for age thoracic spine degeneration with no spinal stenosis or neural impingement. 2. Questionable posterior rib lesions at the T6 and T8 levels. Consider follow-up Chest CT or Bone Scan. 3. Indeterminate left lung nodularity also, for which repeat chest CT would be most valuable. 4. Indeterminate superficial skin lesions over the right posterior thorax, roughly stable since 2019 but also described by PET-CT in October. Electronically Signed   By: Genevie Ann [name]   On: 02/11/2020 21:59   DG Hand Complete Right  Result Date: 01/21/2020 CLINICAL DATA:  Swelling, history breast cancer EXAM: RIGHT HAND - COMPLETE 3+ VIEW COMPARISON:  None  FINDINGS: Osseous demineralization. Scattered mild degenerative changes greatest at first [facility] joint and IP joint thumb. Scattered soft tissue swelling. Fingers superimposed on lateral view limiting assessment. No acute fracture, dislocation or bone destruction. IMPRESSION: Scattered degenerative changes and soft  tissue swelling. No acute osseous abnormalities. Electronically Signed   By: Lavonia Dana [name]   On: 01/21/2020 11:42   VAS [name] LOWER EXTREMITY VENOUS (DVT) ([facility] 7a-7p)  Result Date: 01/21/2020  Lower Venous DVTStudy Indications: Pain, and Swelling.  Limitations: Poor ultrasound/tissue interface and patient positioning, patient movement. Comparison Study: 01/16/2020 - Negative for DVT. Performing Technologist: Oliver Hum RVT  Examination Guidelines: A complete evaluation includes B-mode imaging, spectral Doppler, color Doppler, and power Doppler as needed of all accessible portions of each vessel. Bilateral testing is considered an integral part of a complete examination. Limited examinations for reoccurring indications may be performed as noted. The reflux portion of the exam is performed with the patient in reverse Trendelenburg.  +---------+---------------+---------+-----------+----------+--------------+ RIGHT    CompressibilityPhasicitySpontaneityPropertiesThrombus Aging +---------+---------------+---------+-----------+----------+--------------+ CFV      Full           Yes      Yes                                 +---------+---------------+---------+-----------+----------+--------------+ SFJ      Full                                                        +---------+---------------+---------+-----------+----------+--------------+ FV Prox  Full                                                        +---------+---------------+---------+-----------+----------+--------------+ FV Mid   Full                                                         +---------+---------------+---------+-----------+----------+--------------+ FV DistalFull                                                        +---------+---------------+---------+-----------+----------+--------------+ PFV      Full                                                        +---------+---------------+---------+-----------+----------+--------------+ POP      Full           Yes      Yes                                 +---------+---------------+---------+-----------+----------+--------------+ PTV      Full                                                        +---------+---------------+---------+-----------+----------+--------------+  PERO     Full                                                        +---------+---------------+---------+-----------+----------+--------------+   +---------+---------------+---------+-----------+----------+--------------+ LEFT     CompressibilityPhasicitySpontaneityPropertiesThrombus Aging +---------+---------------+---------+-----------+----------+--------------+ CFV      Full           Yes      Yes                                 +---------+---------------+---------+-----------+----------+--------------+ SFJ      Full                                                        +---------+---------------+---------+-----------+----------+--------------+ FV Prox  Full                                                        +---------+---------------+---------+-----------+----------+--------------+ FV Mid   Full                                                        +---------+---------------+---------+-----------+----------+--------------+ FV DistalFull                                                        +---------+---------------+---------+-----------+----------+--------------+ PFV      Full                                                         +---------+---------------+---------+-----------+----------+--------------+ POP      Full           Yes      Yes                                 +---------+---------------+---------+-----------+----------+--------------+ PTV      Full                                                        +---------+---------------+---------+-----------+----------+--------------+ PERO     Full                                                        +---------+---------------+---------+-----------+----------+--------------+  Summary: RIGHT: - There is no evidence of deep vein thrombosis in the lower extremity.  - No cystic structure found in the popliteal fossa.  LEFT: - There is no evidence of deep vein thrombosis in the lower extremity.  - No cystic structure found in the popliteal fossa.  *See table(s) above for measurements and observations. Electronically signed by Harold Barban MD on 01/21/2020 at 9:44:28 PM.    Final    VAS [name] LOWER EXTREMITY VENOUS (DVT) (ONLY [facility])  Result Date: 01/16/2020  Lower Venous DVTStudy Indications: Swelling.  Limitations: Body habitus and poor ultrasound/tissue interface. Comparison Study: No prior exam. Performing Technologist: Baldwin Crown ARDMS, RVT  Examination Guidelines: A complete evaluation includes B-mode imaging, spectral Doppler, color Doppler, and power Doppler as needed of all accessible portions of each vessel. Bilateral testing is considered an integral part of a complete examination. Limited examinations for reoccurring indications may be performed as noted. The reflux portion of the exam is performed with the patient in reverse Trendelenburg.  +---------+---------------+---------+-----------+----------+------------------+ RIGHT    CompressibilityPhasicitySpontaneityPropertiesThrombus Aging     +---------+---------------+---------+-----------+----------+------------------+ CFV      Full           Yes      Yes                                      +---------+---------------+---------+-----------+----------+------------------+ SFJ      Full                                                            +---------+---------------+---------+-----------+----------+------------------+ FV Prox  Full                                                            +---------+---------------+---------+-----------+----------+------------------+ FV Mid   Full                                                            +---------+---------------+---------+-----------+----------+------------------+ FV DistalFull                                                            +---------+---------------+---------+-----------+----------+------------------+ PFV      Full                                                            +---------+---------------+---------+-----------+----------+------------------+ POP      Full           Yes      Yes                                     +---------+---------------+---------+-----------+----------+------------------+  PTV      Full                                         visualized with                                                          color              +---------+---------------+---------+-----------+----------+------------------+ PERO     Full                                         visualized with                                                          color              +---------+---------------+---------+-----------+----------+------------------+   +---------+---------------+---------+-----------+----------+------------------+ LEFT     CompressibilityPhasicitySpontaneityPropertiesThrombus Aging     +---------+---------------+---------+-----------+----------+------------------+ CFV      Full           Yes      Yes                                     +---------+---------------+---------+-----------+----------+------------------+ SFJ      Full                                                             +---------+---------------+---------+-----------+----------+------------------+ FV Prox  Full                                                            +---------+---------------+---------+-----------+----------+------------------+ FV Mid   Full                                                            +---------+---------------+---------+-----------+----------+------------------+ FV DistalFull                                         visualized with  color              +---------+---------------+---------+-----------+----------+------------------+ PFV      Full                                                            +---------+---------------+---------+-----------+----------+------------------+ POP      Full           Yes      Yes                                     +---------+---------------+---------+-----------+----------+------------------+ PTV      Full                                         visualized with                                                          color              +---------+---------------+---------+-----------+----------+------------------+ PERO     Full                                         visualized with                                                          color              +---------+---------------+---------+-----------+----------+------------------+     Summary: BILATERAL: - No evidence of deep vein thrombosis seen in the lower extremities, bilaterally.  RIGHT: - Portions of this examination were limited- see technologist comments above. - No cystic structure found in the popliteal fossa.  LEFT: - Portions of this examination were limited- see technologist comments above. - No cystic structure found in the popliteal fossa.  *See table(s) above for measurements and observations.  Electronically signed by Monica Martinez MD on 01/16/2020 at 4:22:13 PM.    Final     Lab Data:  CBC: Recent Labs  Lab 02/10/20 1552 02/11/20 0707 02/12/20 0519  WBC 7.5 5.7 3.7*  NEUTROABS 5.6 3.7 2.0  HGB 10.4* 9.9* 9.7*  HCT 33.3* 31.5* 31.3*  MCV 91.7 91.8 90.5  PLT 239 237 [id number]   Basic Metabolic Panel: Recent Labs  Lab 02/10/20 1552 02/11/20 0707 02/12/20 0519  NA 136 139 140  K 3.2* 3.4* 3.6  CL 101 102 104  CO2 25 25 26   GLUCOSE 99 93 98  BUN 16 18 17   CREATININE 0.90 1.19* 0.84  CALCIUM 8.9 8.9 9.1  MG  --  1.4* 1.4*  PHOS  --   --  3.1   GFR: Estimated Creatinine Clearance: 77.5 mL/min (by C-G  formula based on SCr of 0.84 mg/dL). Liver Function Tests: Recent Labs  Lab 02/10/20 1552 02/11/20 0707 02/12/20 0519  AST 51* 44* 37  ALT 26 24 21   ALKPHOS 89 79 77  BILITOT 0.8 0.4 0.3  PROT 7.1 6.6 6.7  ALBUMIN 3.0* 2.8* 2.8*   No results for input(s): LIPASE, AMYLASE in the last 168 hours. No results for input(s): AMMONIA in the last 168 hours. Coagulation Profile: No results for input(s): INR, PROTIME in the last 168 hours. Cardiac Enzymes: No results for input(s): CKTOTAL, CKMB, CKMBINDEX, TROPONINI in the last 168 hours. BNP (last 3 results) No results for input(s): PROBNP in the last 8760 hours. HbA1C: No results for input(s): HGBA1C in the last 72 hours. CBG: No results for input(s): GLUCAP in the last 168 hours. Lipid Profile: No results for input(s): CHOL, HDL, LDLCALC, TRIG, CHOLHDL, LDLDIRECT in the last 72 hours. Thyroid Function Tests: Recent Labs    02/11/20 0707 02/12/20 1110  TSH 0.348*  --   FREET4  --  0.87   Anemia Panel: No results for input(s): VITAMINB12, FOLATE, FERRITIN, TIBC, IRON, RETICCTPCT in the last 72 hours. Urine analysis:    Component Value Date/Time   COLORURINE AMBER (A) 02/10/2020 2135   APPEARANCEUR CLOUDY (A) 02/10/2020 2135   LABSPEC 1.019 02/10/2020 2135   PHURINE 6.0 02/10/2020 2135   GLUCOSEU  NEGATIVE 02/10/2020 2135   HGBUR NEGATIVE 02/10/2020 2135   BILIRUBINUR NEGATIVE 02/10/2020 2135   KETONESUR NEGATIVE 02/10/2020 2135   PROTEINUR 100 (A) 02/10/2020 2135   UROBILINOGEN 0.2 12/29/2010 1427   NITRITE NEGATIVE 02/10/2020 2135   LEUKOCYTESUR MODERATE (A) 02/10/2020 2135     Lennix Kneisel [name] [facility] 02/12/2020, 2:51 PM   Call night coverage person covering after 7pm

## 2020-02-12 NOTE — Progress Notes (Signed)
HEMATOLOGY-ONCOLOGY PROGRESS NOTE  SUBJECTIVE: [name] ia alert, comfortable,anxious.  She tells me she cannot walk.  The physical therapy note from yesterday records minimal assistance for mobility, walking about 45 feet with a rolling walker.  She does have impaired gait and balance.  Home health physical therapy versus SNF was recommended.  Oncology History  Malignant neoplasm of upper-inner quadrant of right breast in female, estrogen receptor positive ([place])  10/03/2013 Initial Diagnosis   Malignant neoplasm of upper-inner quadrant of right breast in female, estrogen receptor positive ([place])   06/07/2016 -  Chemotherapy   The patient had trastuzumab (HERCEPTIN) 651 mg in sodium chloride 0.9 % 250 mL chemo infusion, 8 mg/kg = 651 mg, Intravenous,  Once, 35 of 35 cycles Dose modification: 440 mg (original dose 6 mg/kg, Cycle 7, Reason: Provider Judgment), 4 mg/kg (original dose 6 mg/kg, Cycle 10, Reason: Provider Judgment), 6 mg/kg (original dose 6 mg/kg, Cycle 17, Reason: Other (see comments), Comment: MD confirmed dose), 4 mg/kg (original dose 6 mg/kg, Cycle 21, Reason: Provider Judgment), 6 mg/kg (original dose 6 mg/kg, Cycle 26, Reason: Other (see comments), Comment: reentered for scanning issues), 6 mg/kg (original dose 6 mg/kg, Cycle 27, Reason: Other (see comments), Comment: reentered to use current herceptin vial of '150mg'$ ) Administration: 651 mg (06/07/2016), 483 mg (06/28/2016), 483 mg (01/03/2017), 483 mg (08/30/2016), 483 mg (10/11/2016), 440 mg (02/23/2017), 450 mg (03/16/2017), 450 mg (04/06/2017), 450 mg (04/27/2017), 300 mg (05/30/2017), 450 mg (07/04/2017), 450 mg (08/15/2017), 450 mg (09/05/2017), 450 mg (09/27/2017), 525 mg (11/29/2017), 525 mg (12/27/2017), 525 mg (01/24/2018), 525 mg (03/21/2018), 525 mg (04/18/2018), 336 mg (05/24/2018), 525 mg (06/13/2018), 525 mg (07/14/2018), 525 mg (08/14/2018), 525 mg (09/11/2018), 525 mg (10/23/2018), 525 mg (11/21/2018), 525 mg (12/19/2018), 525 mg (01/16/2019), 525 mg  (02/13/2019), 525 mg (03/12/2019), 525 mg (04/09/2019), 525 mg (05/07/2019), 525 mg (06/04/2019), 525 mg (07/02/2019) pertuzumab (PERJETA) 840 mg in sodium chloride 0.9 % 250 mL chemo infusion, 840 mg, Intravenous, Once, 31 of 33 cycles Dose modification: 420 mg (original dose 420 mg, Cycle 8), 420 mg (original dose 420 mg, Cycle 14), 420 mg (original dose 420 mg, Cycle 18) Administration: 840 mg (06/07/2016), 420 mg (06/28/2016), 420 mg (04/06/2017), 420 mg (04/27/2017), 420 mg (07/04/2017), 420 mg (09/27/2017), 420 mg (11/09/2017), 420 mg (11/29/2017), 420 mg (12/27/2017), 420 mg (03/21/2018), 420 mg (04/18/2018), 420 mg (06/13/2018), 420 mg (07/14/2018), 420 mg (08/14/2018), 420 mg (09/11/2018), 420 mg (01/24/2018), 420 mg (10/23/2018), 420 mg (11/21/2018), 420 mg (12/19/2018), 420 mg (01/16/2019), 420 mg (02/13/2019), 420 mg (03/12/2019), 420 mg (04/09/2019), 420 mg (05/07/2019), 420 mg (06/04/2019), 420 mg (07/02/2019), 420 mg (07/30/2019), 420 mg (10/18/2019), 420 mg (12/18/2019), 420 mg (01/15/2020) trastuzumab-anns (KANJINTI) 525 mg in sodium chloride 0.9 % 250 mL chemo infusion, 6 mg/kg = 525 mg (100 % of original dose 6 mg/kg), Intravenous,  Once, 4 of 6 cycles Dose modification: 6 mg/kg (original dose 6 mg/kg, Cycle 36, Reason: Other (see comments)) Administration: 525 mg (07/30/2019), 525 mg (10/18/2019), 525 mg (12/18/2019), 525 mg (01/15/2020)  for chemotherapy treatment.    09/11/2018 -  Chemotherapy   The patient had PACLitaxel-protein bound (ABRAXANE) chemo infusion 200 mg, 100 mg/m2 = 200 mg (100 % of original dose 100 mg/m2), Intravenous,  Once, 8 of 11 cycles Dose modification: 100 mg/m2 (original dose 100 mg/m2, Cycle 7) Administration: 200 mg (04/09/2019), 200 mg (05/07/2019), 200 mg (06/04/2019), 200 mg (07/02/2019), 200 mg (07/30/2019), 200 mg (10/18/2019), 200 mg (12/18/2019), 200 mg (01/15/2020) PACLitaxel-protein bound (ABRAXANE) chemo infusion 200  mg, 100 mg/m2 = 200 mg, Intravenous, Once, 6 of 6 cycles Administration: 200 mg  (10/23/2018), 200 mg (11/21/2018), 200 mg (12/19/2018), 200 mg (01/16/2019), 200 mg (02/13/2019), 200 mg (03/12/2019)  for chemotherapy treatment.    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       REVIEW OF SYSTEMS:   She does not complain of pain.  She is currently on no pain medication.   PHYSICAL EXAMINATION:   Vitals:   02/11/20 2141 02/12/20 0645  BP: (!) 114/49 99/61  Pulse: 84 85  Resp: 20 18  Temp: 98.6 F (37 C) 99.2 F (37.3 C)  SpO2: 97% 100%   Filed Weights   02/11/20 0246  Weight: 196 lb 4.8 oz (89 kg)    Intake/Output from previous day: 04/06 0701 - 04/07 0700 In: 340 [P.O.:240; IV Piggyback:100] Out: 450 [Urine:450]   Dorsiflexion bilaterally is 5/5.  She is able to straight leg raise to about 25 degrees on the right and 40 degrees on the left.  LABORATORY DATA:  I have reviewed the data as listed CMP Latest Ref Rng & Units 02/12/2020 02/11/2020 02/10/2020  Glucose 70 - 99 mg/dL 98 93 99  BUN 8 - 23 mg/dL '17 18 16  '$ Creatinine 0.44 - 1.00 mg/dL 0.84 1.19(H) 0.90  Sodium 135 - 145 mmol/L 140 139 136  Potassium 3.5 - 5.1 mmol/L 3.6 3.4(L) 3.2(L)  Chloride 98 - 111 mmol/L 104 102 101  CO2 22 - 32 mmol/L '26 25 25  '$ Calcium 8.9 - 10.3 mg/dL 9.1 8.9 8.9  Total Protein 6.5 - 8.1 g/dL 6.7 6.6 7.1  Total Bilirubin 0.3  - 1.2 mg/dL 0.3 0.4 0.8  Alkaline Phos 38 - 126 U/L 77 79 89  AST 15 - 41 U/L 37 44(H) 51(H)  ALT 0 - 44 U/L '21 24 26    '$ Lab Results  Component Value Date   WBC 3.7 (L) 02/12/2020   HGB 9.7 (L) 02/12/2020   HCT 31.3 (L) 02/12/2020   MCV 90.5 02/12/2020   PLT 245 02/12/2020   NEUTROABS 2.0 02/12/2020    DG Chest 2 View  Result Date: 02/10/2020 CLINICAL DATA:  Altered mental status. EXAM: CHEST - 2 VIEW COMPARISON:  January 25, 2020 FINDINGS: There is a well-positioned left-sided Port-A-Cath. The heart size is stable. Areas of scarring and atelectasis are noted bilaterally. There is no pneumothorax. There are degenerative changes throughout the visualized thoracic spine. IMPRESSION: No active cardiopulmonary disease. Electronically Signed   By: Constance Holster [name]   On: 02/10/2020 23:19   DG Chest 2 View  Result Date: 01/25/2020 CLINICAL DATA:  Cough and shortness of breath. History of breast cancer. Patient is unsteady on her feet. Patient has difficulty holding still. EXAM: CHEST - 2 VIEW COMPARISON:  01/21/2020 FINDINGS: Patient's LEFT-sided power port, tip to the superior vena cava. Heart is UPPER limits normal. There are  patchy infiltrates in the lungs persist, but there has been some improvement in aeration accounting for differences in technique. No pleural effusions. RIGHT axillary node dissection. Patient motion artifact. IMPRESSION: Persistent but improved bilateral infiltrates. Electronically Signed   By: Nolon Nations [name]   On: 01/25/2020 16:54   DG Chest 2 View  Result Date: 01/21/2020 CLINICAL DATA:  Leg swelling. Additional history provided: Bilateral lower extremity swelling, right arm swelling, history of breast cancer, multiple falls. EXAM: CHEST - 2 VIEW COMPARISON:  Chest radiographs 01/16/2020 and earlier FINDINGS: Stable position of a left chest infusion port catheter with tip projecting in the region of the right atrium. Heart size at the upper limits of normal,  unchanged. Bilateral interstitial and airspace opacities, more conspicuous as compared to prior exam 01/16/2020. No evidence of pleural effusion or pneumothorax. Thoracic spondylosis. IMPRESSION: Bilateral interstitial and ill-defined airspace opacities, more conspicuous as compared to prior exam 01/16/2020. Findings may reflect pulmonary edema. Infection cannot be excluded. Electronically Signed   By: Kellie Simmering DO   On: 01/21/2020 11:42   DG Chest 2 View  Result Date: 01/16/2020 CLINICAL DATA:  Hypoxia and lower extremity edema EXAM: CHEST - 2 VIEW COMPARISON:  November 19, 2019. FINDINGS: Port-A-Cath tip is at the cavoatrial junction. No pneumothorax. Lungs are clear. Heart is upper normal in size with pulmonary vascularity normal. No adenopathy. There is degenerative change in the thoracic spine. IMPRESSION: Port-A-Cath tip at cavoatrial junction. Lungs clear. Cardiac silhouette within normal limits. Electronically Signed   By: Lowella Grip III [name]   On: 01/16/2020 12:51   DG Forearm Right  Result Date: 01/21/2020 CLINICAL DATA:  Right arm pain and swelling EXAM: RIGHT FOREARM - 2 VIEW COMPARISON:  None. FINDINGS: There is no evidence of fracture or other focal bone lesions. No cortical destruction or periostitis. Diffuse circumferential soft tissue swelling is evident throughout the forearm. No soft tissue gas or radiopaque foreign body is seen. IMPRESSION: Diffuse circumferential soft tissue swelling throughout the forearm. No acute bony abnormality. Electronically Signed   By: Davina Poke D.O.   On: 01/21/2020 11:44   DG Tibia/Fibula Left  Result Date: 01/21/2020 CLINICAL DATA:  Left leg pain EXAM: LEFT TIBIA AND FIBULA - 2 VIEW COMPARISON:  12/29/2010 FINDINGS: There is no evidence of fracture or other focal bone lesions. No cortical destruction or periostitis. Mineralization is again noted within the soft tissues medial to the mid tibial diaphysis, unchanged from prior study  12/29/2010. Soft tissues are otherwise unremarkable. IMPRESSION: Negative. Electronically Signed   By: Davina Poke D.O.   On: 01/21/2020 11:45   CT Head Wo Contrast  Result Date: 02/10/2020 CLINICAL DATA:  64 year old female with headache. History of metastatic breast cancer. EXAM: CT HEAD WITHOUT CONTRAST TECHNIQUE: Contiguous axial images were obtained from the base of the skull through the vertex without intravenous contrast. COMPARISON:  Head CT dated 01/16/2020. FINDINGS: Brain: The ventricles and sulci appropriate size for patient's age. Areas of white matter hypodensity involving the inferior right frontal and right temporal lobes similar to prior CT in keeping with metastatic disease. There is no acute intracranial hemorrhage. No mass effect or midline shift. No extra-axial fluid collection. Vascular: No hyperdense vessel or unexpected calcification. Skull: Normal. Negative for fracture or focal lesion. Sinuses/Orbits: The visualized paranasal sinuses and the right mastoid air cells are clear. Mild left mastoid effusions. No air-fluid level. Other: None IMPRESSION: 1. No acute intracranial hemorrhage. 2. Similar appearance of right frontal and temporal white matter hypodensity  in keeping with metastatic disease. Electronically Signed   By: Anner Crete [name]   On: 02/10/2020 23:10   CT Head Wo Contrast  Result Date: 01/16/2020 CLINICAL DATA:  Altered mental status. Positive COVID-19 test last month. Breast cancer metastatic to the brain. EXAM: CT HEAD WITHOUT CONTRAST TECHNIQUE: Contiguous axial images were obtained from the base of the skull through the vertex without intravenous contrast. COMPARISON:  01/12/2020. Brain MR dated 09/04/2019. FINDINGS: Brain: Stable areas of low density in the right frontal and temporal lobes at the locations of the patient's known masses. Stable mild patchy white matter low density in both cerebral hemispheres. No intracranial hemorrhage or CT evidence of  acute infarction. No mass effect. Vascular: No hyperdense vessel or unexpected calcification. Skull: Normal. Negative for fracture or focal lesion. Sinuses/Orbits: Unremarkable Other: None. IMPRESSION: 1. No acute abnormality. 2. Stable areas of low density in the right frontal and temporal lobes at the locations of the patient's known masses compatible with metastases. 3. Stable mild chronic small vessel white matter ischemic changes in both cerebral hemispheres. Electronically Signed   By: Claudie Revering [name]   On: 01/16/2020 12:33   MR Brain W Wo Contrast  Result Date: 02/01/2020 CLINICAL DATA:  Follow-up breast cancer with brain metastases. Status post radiation therapy 11-10/2019. EXAM: MRI HEAD WITHOUT AND WITH CONTRAST TECHNIQUE: Multiplanar, multiecho pulse sequences of the brain and surrounding structures were obtained without and with intravenous contrast. CONTRAST:  7.80m GADAVIST GADOBUTROL 1 MMOL/ML IV SOLN COMPARISON:  Head CT 01/16/2020 and MRI 09/04/2019 FINDINGS: The study is mildly motion degraded. Brain: A 2.8 x 2.0 x 1.9 cm (AP x transverse x craniocaudal) heterogeneously enhancing right frontal mass and a 4.9 x 2.4 x 2.0 cm right temporal mass have both decreased in size from the prior MRI with both appearing partially cystic. There is persistent mild edema surrounding the right temporal lesion, while right frontal lobe edema has resolved. No new enhancing lesion is identified. There is no acute infarct, midline shift, or extra-axial fluid collection. Small foci of T2 hyperintensity in the cerebral white matter bilaterally have mildly increased and are nonspecific but compatible with mild chronic small vessel ischemic disease. There is a mildly expanded partially empty sella. There is mild cerebral atrophy. Vascular: Major intracranial vascular flow voids are preserved. Skull and upper cervical spine: No destructive skull lesion. Diffusely diminished bone marrow T1 signal intensity in the  included cervical spine, nonspecific but may be related to the patient's known anemia. Sinuses/Orbits: Unremarkable orbits. Clear paranasal sinuses. Small left mastoid effusion. Other: None. IMPRESSION: 1. Decreased size of right frontal and right temporal lobe metastases. Mild residual right temporal edema without significant mass effect. 2. No evidence of new intracranial metastases or acute abnormality. Electronically Signed   By: ALogan BoresM.D.   On: 02/01/2020 16:44   MR THORACIC SPINE WO CONTRAST  Result Date: 02/11/2020 CLINICAL DATA:  64year old female with metastatic breast cancer. Back pain. A total spine MRI without and with contrast was planned, but the patient was only able to tolerate a noncontrast thoracic exam due to pain. EXAM: MRI THORACIC SPINE WITHOUT CONTRAST TECHNIQUE: Multiplanar, multisequence MR imaging of the thoracic spine was performed. No intravenous contrast was administered. COMPARISON:  CT cervical spine 11/19/2019. 08/20/2019 PET-CT. 05/03/2018 chest CT. FINDINGS: Limited cervical spine imaging: Straightening of cervical lordosis appears stable since January. Grossly normal bone marrow signal in the visible cervical spine. Thoracic spine segmentation:  Appears normal. Alignment:  Preserved thoracic kyphosis. No  spondylolisthesis. Vertebrae: Thoracic bone marrow signal is within normal limits. No marrow edema or evidence of acute osseous abnormality. No suspicious thoracic vertebral lesion identified. However, there are questionable posterior rib lesions, such as on the right at the T6 and T8 levels series 21, images 19 and 25. Cord: Capacious thoracic spinal canal. Thoracic spinal cord and conus medullaris appear within normal limits. The conus is at T12-L1. Paraspinal and other soft tissues: Chronic superficial skin and soft tissue nodularity to the right of midline from the T1 through T8 thoracic levels (series 21, image 13 and series 17, image 7). This appears grossly  stable since 2019 and may be chronic scarring. However, there was described hypermetabolism here on a 08/20/2019 PET-CT Other thoracic paraspinal soft tissues are within normal limits. There is mildly heterogeneous signal in the visible left lung including a 12 mm nodular area on series 20, image 15. Trace right pleural effusion. Negative visible upper abdominal viscera. Disc levels: Mild for age thoracic spine degeneration with no spinal stenosis or neural foraminal stenosis. IMPRESSION: 1. No metastatic disease identified in the noncontrast thoracic spine. Mild for age thoracic spine degeneration with no spinal stenosis or neural impingement. 2. Questionable posterior rib lesions at the T6 and T8 levels. Consider follow-up Chest CT or Bone Scan. 3. Indeterminate left lung nodularity also, for which repeat chest CT would be most valuable. 4. Indeterminate superficial skin lesions over the right posterior thorax, roughly stable since 2019 but also described by PET-CT in October. Electronically Signed   By: Genevie Ann [name]   On: 02/11/2020 21:59   DG Hand Complete Right  Result Date: 01/21/2020 CLINICAL DATA:  Swelling, history breast cancer EXAM: RIGHT HAND - COMPLETE 3+ VIEW COMPARISON:  None FINDINGS: Osseous demineralization. Scattered mild degenerative changes greatest at first [facility] joint and IP joint thumb. Scattered soft tissue swelling. Fingers superimposed on lateral view limiting assessment. No acute fracture, dislocation or bone destruction. IMPRESSION: Scattered degenerative changes and soft tissue swelling. No acute osseous abnormalities. Electronically Signed   By: Lavonia Dana [name]   On: 01/21/2020 11:42   VAS [name] LOWER EXTREMITY VENOUS (DVT) ([facility] 7a-7p)  Result Date: 01/21/2020  Lower Venous DVTStudy Indications: Pain, and Swelling.  Limitations: Poor ultrasound/tissue interface and patient positioning, patient movement. Comparison Study: 01/16/2020 - Negative for DVT. Performing Technologist:  Oliver Hum RVT  Examination Guidelines: A complete evaluation includes B-mode imaging, spectral Doppler, color Doppler, and power Doppler as needed of all accessible portions of each vessel. Bilateral testing is considered an integral part of a complete examination. Limited examinations for reoccurring indications may be performed as noted. The reflux portion of the exam is performed with the patient in reverse Trendelenburg.  +---------+---------------+---------+-----------+----------+--------------+ RIGHT    CompressibilityPhasicitySpontaneityPropertiesThrombus Aging +---------+---------------+---------+-----------+----------+--------------+ CFV      Full           Yes      Yes                                 +---------+---------------+---------+-----------+----------+--------------+ SFJ      Full                                                        +---------+---------------+---------+-----------+----------+--------------+ FV Prox  Full                                                        +---------+---------------+---------+-----------+----------+--------------+  FV Mid   Full                                                        +---------+---------------+---------+-----------+----------+--------------+ FV DistalFull                                                        +---------+---------------+---------+-----------+----------+--------------+ PFV      Full                                                        +---------+---------------+---------+-----------+----------+--------------+ POP      Full           Yes      Yes                                 +---------+---------------+---------+-----------+----------+--------------+ PTV      Full                                                        +---------+---------------+---------+-----------+----------+--------------+ PERO     Full                                                         +---------+---------------+---------+-----------+----------+--------------+   +---------+---------------+---------+-----------+----------+--------------+ LEFT     CompressibilityPhasicitySpontaneityPropertiesThrombus Aging +---------+---------------+---------+-----------+----------+--------------+ CFV      Full           Yes      Yes                                 +---------+---------------+---------+-----------+----------+--------------+ SFJ      Full                                                        +---------+---------------+---------+-----------+----------+--------------+ FV Prox  Full                                                        +---------+---------------+---------+-----------+----------+--------------+ FV Mid   Full                                                        +---------+---------------+---------+-----------+----------+--------------+  FV DistalFull                                                        +---------+---------------+---------+-----------+----------+--------------+ PFV      Full                                                        +---------+---------------+---------+-----------+----------+--------------+ POP      Full           Yes      Yes                                 +---------+---------------+---------+-----------+----------+--------------+ PTV      Full                                                        +---------+---------------+---------+-----------+----------+--------------+ PERO     Full                                                        +---------+---------------+---------+-----------+----------+--------------+     Summary: RIGHT: - There is no evidence of deep vein thrombosis in the lower extremity.  - No cystic structure found in the popliteal fossa.  LEFT: - There is no evidence of deep vein thrombosis in the lower extremity.  - No cystic structure found in the popliteal  fossa.  *See table(s) above for measurements and observations. Electronically signed by Harold Barban MD on 01/21/2020 at 9:44:28 PM.    Final    VAS [name] LOWER EXTREMITY VENOUS (DVT) (ONLY [facility] & [facility])  Result Date: 01/16/2020  Lower Venous DVTStudy Indications: Swelling.  Limitations: Body habitus and poor ultrasound/tissue interface. Comparison Study: No prior exam. Performing Technologist: Baldwin Crown ARDMS, RVT  Examination Guidelines: A complete evaluation includes B-mode imaging, spectral Doppler, color Doppler, and power Doppler as needed of all accessible portions of each vessel. Bilateral testing is considered an integral part of a complete examination. Limited examinations for reoccurring indications may be performed as noted. The reflux portion of the exam is performed with the patient in reverse Trendelenburg.  +---------+---------------+---------+-----------+----------+------------------+ RIGHT    CompressibilityPhasicitySpontaneityPropertiesThrombus Aging     +---------+---------------+---------+-----------+----------+------------------+ CFV      Full           Yes      Yes                                     +---------+---------------+---------+-----------+----------+------------------+ SFJ      Full                                                            +---------+---------------+---------+-----------+----------+------------------+  FV Prox  Full                                                            +---------+---------------+---------+-----------+----------+------------------+ FV Mid   Full                                                            +---------+---------------+---------+-----------+----------+------------------+ FV DistalFull                                                            +---------+---------------+---------+-----------+----------+------------------+ PFV      Full                                                             +---------+---------------+---------+-----------+----------+------------------+ POP      Full           Yes      Yes                                     +---------+---------------+---------+-----------+----------+------------------+ PTV      Full                                         visualized with                                                          color              +---------+---------------+---------+-----------+----------+------------------+ PERO     Full                                         visualized with                                                          color              +---------+---------------+---------+-----------+----------+------------------+   +---------+---------------+---------+-----------+----------+------------------+ LEFT     CompressibilityPhasicitySpontaneityPropertiesThrombus Aging     +---------+---------------+---------+-----------+----------+------------------+ CFV      Full           Yes      Yes                                     +---------+---------------+---------+-----------+----------+------------------+  SFJ      Full                                                            +---------+---------------+---------+-----------+----------+------------------+ FV Prox  Full                                                            +---------+---------------+---------+-----------+----------+------------------+ FV Mid   Full                                                            +---------+---------------+---------+-----------+----------+------------------+ FV DistalFull                                         visualized with                                                          color              +---------+---------------+---------+-----------+----------+------------------+ PFV      Full                                                             +---------+---------------+---------+-----------+----------+------------------+ POP      Full           Yes      Yes                                     +---------+---------------+---------+-----------+----------+------------------+ PTV      Full                                         visualized with                                                          color              +---------+---------------+---------+-----------+----------+------------------+ PERO     Full  visualized with                                                          color              +---------+---------------+---------+-----------+----------+------------------+     Summary: BILATERAL: - No evidence of deep vein thrombosis seen in the lower extremities, bilaterally.  RIGHT: - Portions of this examination were limited- see technologist comments above. - No cystic structure found in the popliteal fossa.  LEFT: - Portions of this examination were limited- see technologist comments above. - No cystic structure found in the popliteal fossa.  *See table(s) above for measurements and observations. Electronically signed by Monica Martinez MD on 01/16/2020 at 4:22:13 PM.    Final     ASSESSMENT:  64 y.o.  woman with stage IV breast cancer manifested chiefly by loco-regional nodal disease (neck, chest) and skin involvement, without liver, bone, or brain metastases documented   (1) Status post right upper inner quadrant lumpectomy and sentinel lymph node sampling 03/04/2002 for 2 separate foci of invasive ductal carcinoma, mpT2 pN1 or stage IIB, both foci grade 3, both estrogen and progesterone receptor positive, both HercepTest 3+, Mib-1 56%  (2) Reexcision for margins 05/27/2002 showed no residual cancer in the breast.  (3) The patient refused adjuvant systemic therapy.  (4) Adjuvant radiation treatment completed 08/14/2002.  (5) recurrence in the  right breast in 02/2004 showing a morphologically different tumor, again grade 3, again estrogen and progesterone receptor negative, with an MIB-1 of 14% and Herceptest 3+.  (5) Between 03/2004 and 07/2004 she received dose dense Doxorubicin/Cyclophosphamide x 4 given with trastuzumab, followed by weekly Docetaxel x 8, again given with trastuzumab.  (6) Right mastectomy 07/13/2004 showed scattered microscopic foci of residual disease over an area  greater than 5 cm. Margins were negative.  (7) Postoperative Docetaxel continued until 09/2004.  (8) Trastuzumab (Herceptin) given 08/2004 through 01/2012 with some brief interruptions.  RECURRENT/ STAGE IV DISEASE December 2006 (9) Isolated right cervical nodal recurrence 10/2005, treated with radiation to the right supraclavicular area (total 41.5 gray) completed 12/29/2005.  (10) Navelbine given together with Herceptin  11/2005 through 03/2006.  (9) Left mastectomy 02/13/2006 for ductal carcinoma in situ, grade 2, estrogen and progesterone receptor negative, with negative margins; 0 of 3 lymph nodes involved  (10) treated with Lapatinib and Capecitabine before 10/2009, for an unclear duration and with unclear results (cannot locate data on chart review).  (11) Status post right supraclavicular lymph node biopsy 09/2010 again positive for an invasive ductal carcinoma, estrogen and progesterone receptor negative, HER-2 positive by CISH with a ratio 4.25.  (12) Navelbine given together with Herceptin between 05/2011 and 11/2011.  (13) Carboplatin/ Gemcitabine/ Herceptin given for 2 cycles, in 12/2011 and 01/2012.  (14) TDM-1 ([place]) started 02/2012. Last dose 10/02/2013 after which the patient discontinued treatment at her own discretion. Echo on December 2014 showed a well preserved ejection fraction.  (15) Deep vein thrombosis of the right upper extremity documented 04/20/2011.  She completed anticoagulant therapy with Coumadin  on 03/25/2013.  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 August 2016 to 05/08/2016, when the patient opted for a second try at chemotherapy  (20) started low-dose Abraxane, trastuzumab and pertuzumab 06/07/2016, to be repeated every 4 weeks.              (a) pretreatment echocardiogram 06/06/2016 showed a 60-65% ejection fraction             (b) significant compliance problems compromise response to treatment             (c)  echocardiogram 02/20/17 LVEF 55-60%             (d) Abraxane discontinued 07/04/2017 because of possible neuropathy symptoms             (e) Abraxane resumed 09/27/2017             (f) echocardiogram 07/20/2017 showed an ejection fraction of 60-65%             (g) echo 01/22/2018 showed an ejection fraction of 50-55%             (h) CT scan of the chest 05/03/2018 shows no disease involving the lungs liver or lymph nodes, with stable subcutaneous masses as previously noted             (I) Abraxane discontinued August 2019 because of concerns regarding neuropathy-- gemcitabine substituted             (j) echocardiogram 08/20/2018 shows an ejection fraction in the 55-60% range (done every 6 months)             (k) gemcitabine discontinued because of multiple symptoms, Abraxane restarted 09/11/2018, given once every 4 weeks             (l) echocardiogram 07/17/2019 shows a well-preserved  ejection fraction at 55-60%             (m) restaging PET scan 08/20/2019 shows hypermetabolic adenopathy, subcutaneous malignancy as clinically appreciated, but no liver or lung parenchymal lesions   (21) Chronic pain secondary to known metastatic disease             (a) patient signed pain contract 10/19/2016             (b) as of 11/16/2016 receives her pain medications from Dr [name]             (c)  Dr [name] no longer able to prescribe as of February 2019             (d) all pain medications now through [facility], Dr Mirna Mires (as of April 2019)  (22) left breast and left axillary adenopathy noted clinically             (a) left axillary lymph node biopsy on 07/24/2019 benign  (23) BRAIN METASTASES: frontal and temporal lobe metastases noted on brain MRI 09/04/2019             (a) adjuvant radiation 09/11/2019-10/16/2019:                          The brain was treated  to 30 Gy in 10 fractions.              (b) tucatinib prescribed December 2020 at 150 mg daily because of methadone interaction, finally started March 2021.             (c) noncontrast head CT 01/12/2020 stable lesions/improved edema             (d) brain MRI 02/01/2020 shows decreased size of the right frontal and temporal lobe metastases with mild residual edema, no mass-effect, and no evidence of new mets  (24) COVID-19 infection 2019-11-19   PLAN: Viola feels she cannot walk but see the physical therapy notice.  I asked her if she felt she would be safe at home and she says she has 3 daughters who are there 24 hours a day to help her.  She has poor insight and I think she is a high fall risk.  Her daughters are not all at home nor are they there 24 hours a day.  It is not clear how safe it would be for her to be at home under the present circumstances.  It might be safer if she could be in a rehab facility or SNF for another 2 weeks or so until she is fully ambulatory and safely using a rolling  walker or Rollator.  She says she would be willing to go to an SNF if that is the team's best recommendation.  She was due for chemotherapy today but we are going to postpone that for a week.  She is not on tucatinib at present--I had started her on the very lowest dose, a single tablet a day (this is one quarter of the usual dose) because of concerns regarding tolerance.  We will start that when she returns to see [name] again in a week from now.  The patient's pain medications are prescribed by Dr. [name] whose telephone number is [phone] and fax number of [phone].  Please contact him with questions related to her pain medications.  I greatly appreciate your help to this patient and her family.  We will follow with you.   LOS: 1 day   Chauncey Cruel, DNP, AGPCNP-BC, AOCNP 02/12/20

## 2020-02-13 ENCOUNTER — Telehealth: Payer: Self-pay | Admitting: Oncology

## 2020-02-13 LAB — COMPREHENSIVE METABOLIC PANEL
ALT: 21 U/L (ref 0–44)
AST: 38 U/L (ref 15–41)
Albumin: 2.7 g/dL — ABNORMAL LOW (ref 3.5–5.0)
Alkaline Phosphatase: 72 U/L (ref 38–126)
Anion gap: 12 (ref 5–15)
BUN: 13 mg/dL (ref 8–23)
CO2: 25 mmol/L (ref 22–32)
Calcium: 9 mg/dL (ref 8.9–10.3)
Chloride: 100 mmol/L (ref 98–111)
Creatinine, Ser: 0.89 mg/dL (ref 0.44–1.00)
GFR calc Af Amer: >60 mL/min (ref >60)
GFR calc non Af Amer: >60 mL/min (ref >60)
Glucose, Bld: 83 mg/dL (ref 70–99)
Potassium: 4 mmol/L (ref 3.5–5.1)
Sodium: 137 mmol/L (ref 135–145)
Total Bilirubin: 0.4 mg/dL (ref 0.3–1.2)
Total Protein: 6.4 g/dL — ABNORMAL LOW (ref 6.5–8.1)

## 2020-02-13 LAB — CBC WITH DIFFERENTIAL/PLATELET
Abs Immature Granulocytes: 0.12 10*3/uL — ABNORMAL HIGH (ref 0.00–0.07)
Basophils Absolute: 0 10*3/uL (ref 0.0–0.1)
Basophils Relative: 1 %
Eosinophils Absolute: 0.2 10*3/uL (ref 0.0–0.5)
Eosinophils Relative: 6 %
HCT: 31.4 % — ABNORMAL LOW (ref 36.0–46.0)
Hemoglobin: 9.7 g/dL — ABNORMAL LOW (ref 12.0–15.0)
Immature Granulocytes: 4 %
Lymphocytes Relative: 22 %
Lymphs Abs: 0.8 10*3/uL (ref 0.7–4.0)
MCH: 28.4 pg (ref 26.0–34.0)
MCHC: 30.9 g/dL (ref 30.0–36.0)
MCV: 92.1 fL (ref 80.0–100.0)
Monocytes Absolute: 0.7 10*3/uL (ref 0.1–1.0)
Monocytes Relative: 21 %
Neutro Abs: 1.6 10*3/uL — ABNORMAL LOW (ref 1.7–7.7)
Neutrophils Relative %: 46 %
Platelets: 242 10*3/uL (ref 150–400)
RBC: 3.41 MIL/uL — ABNORMAL LOW (ref 3.87–5.11)
RDW: 16.7 % — ABNORMAL HIGH (ref 11.5–15.5)
WBC: 3.4 10*3/uL — ABNORMAL LOW (ref 4.0–10.5)
nRBC: 0 % (ref 0.0–0.2)

## 2020-02-13 LAB — PHOSPHORUS: Phosphorus: 3.5 mg/dL (ref 2.5–4.6)

## 2020-02-13 LAB — MAGNESIUM: Magnesium: 1.7 mg/dL (ref 1.7–2.4)

## 2020-02-13 LAB — T3: T3, Total: 79 ng/dL (ref 71–180)

## 2020-02-13 MED ORDER — LORAZEPAM 2 MG/ML IJ SOLN
0.5000 mg | Freq: Once | INTRAMUSCULAR | Status: AC
  Administered 2020-02-13: 0.5 mg via INTRAVENOUS
  Filled 2020-02-13: qty 1

## 2020-02-13 MED ORDER — QUETIAPINE FUMARATE 25 MG PO TABS
25.0000 mg | ORAL_TABLET | Freq: Every day | ORAL | Status: DC
  Administered 2020-02-13: 21:00:00 25 mg via ORAL
  Filled 2020-02-13: qty 1

## 2020-02-13 MED ORDER — LORAZEPAM 2 MG/ML IJ SOLN: 1.0000 mg | Freq: Once | INTRAMUSCULAR | Status: AC

## 2020-02-13 MED ORDER — LORAZEPAM 2 MG/ML IJ SOLN
INTRAMUSCULAR | Status: AC
  Administered 2020-02-13: 1 mg via INTRAVENOUS
  Filled 2020-02-13: qty 1

## 2020-02-13 NOTE — Telephone Encounter (Signed)
Called pt per 4/7 sch message - scheduled appts . Unable to reach pt . Left message with appt date and time

## 2020-02-13 NOTE — Progress Notes (Signed)
Upon assessment this RN found 2 xanax on patients lap in bed. Discarded in appropriate waste container, witnessed by Myriam Jacobson, Therapist, sports.

## 2020-02-13 NOTE — TOC Initial Note (Signed)
Transition of Care [facility]) - Initial/Assessment Note    Patient Details  Name: [name] MRN: [id number] Date of Birth: 1956-02-06  Transition of Care [facility]) CM/SW Contact:    [name], RN Phone Number: 02/13/2020, 3:19 PM  Clinical Narrative: [name] to [name] [name] about d/c plans-d/c home w/[facility] aware-HHPT/OT. Decline SNF.[name] @ home w/patient. Has rw. Family will transport home on own. No further CM needs.               Expected Discharge Plan: [place] Barriers to Discharge: Continued Medical Work up   Patient Goals and CMS Choice Patient states their goals for this hospitalization and ongoing recovery are:: go home CMS Medicare.gov Compare Post Acute Care list provided to:: Patient Represenative (must comment)([name]) Choice offered to / list presented to : Adult Children  Expected Discharge Plan and Services Expected Discharge Plan: [place]   Discharge Planning Services: CM Consult   Living arrangements for the past 2 months: Single Family Home                           HH Arranged: PT, OT HH Agency: [place] Date [name] Agency Contacted: 02/13/20 Time HH Agency Contacted: [id number] Representative spoke with at [place]  Prior Living Arrangements/Services Living arrangements for the past 2 months: [place] Lives with:: Adult Children Patient language and need for interpreter reviewed:: Yes Do you feel safe going back to the place where you live?: Yes      Need for Family Participation in Patient Care: No (Comment) Care giver support system in place?: Yes (comment) Current home services: DME(rw,cane) Criminal Activity/Legal Involvement Pertinent to Current Situation/Hospitalization: No - Comment as needed  Activities of Daily Living Home Assistive Devices/Equipment: None ADL Screening (condition at time of admission) Patient's cognitive ability adequate to safely complete daily  activities?: Yes Is the patient deaf or have difficulty hearing?: No Does the patient have difficulty seeing, even when wearing glasses/contacts?: No Does the patient have difficulty concentrating, remembering, or making decisions?: No Patient able to express need for assistance with ADLs?: Yes Does the patient have difficulty dressing or bathing?: Yes Independently performs ADLs?: No Communication: Independent Dressing (OT): Needs assistance Feeding: Independent Bathing: Dependent Toileting: Needs assistance, Dependent In/Out Bed: Dependent Walks in Home: Dependent Does the patient have difficulty walking or climbing stairs?: Yes Weakness of Legs: Both Weakness of Arms/Hands: None  Permission Sought/Granted Permission sought to share information with : Case Manager Permission granted to share information with : Yes, Verbal Permission Granted  Share Information with NAME: Case Manager  Permission granted to share info w AGENCY: [facility]  Permission granted to share info w Relationship: [name] [phone] [id number]     Emotional Assessment Appearance:: Appears stated age Attitude/Demeanor/Rapport: Gracious Affect (typically observed): Accepting Orientation: : Oriented to Self, Oriented to Place, Oriented to  Time, Oriented to Situation Alcohol / Substance Use: Not Applicable Psych Involvement: No (comment)  Admission diagnosis:  Hypokalemia [E87.6] UTI (urinary tract infection) [N39.0] Weakness [R53.1] Polypharmacy [Z79.899] Acute cystitis without hematuria [N30.00] Patient Active Problem List   Diagnosis Date Noted  . UTI (urinary tract infection) 02/11/2020  . Acute metabolic encephalopathy 123456  . Generalized weakness 02/11/2020  . Hypokalemia 02/11/2020  . Paresis of left lower extremity ([place]) 02/11/2020  . Paresis of right lower extremity ([place]) 02/11/2020  . Drug-induced polyneuropathy ([place]) 12/19/2019  . Acute respiratory  failure with hypoxia ([place]) 11/19/2019  .  COVID-19 virus infection 11/19/2019  . Overdose opiate, accidental or unintentional, initial encounter ([place]) 11/19/2019  . Acute encephalopathy 11/19/2019  . Metastasis to brain ([place]) 09/13/2019  . Acute lower UTI 02/06/2018  . Altered mental status 02/06/2018  . Bacteremia 02/06/2018  . Polypharmacy 02/06/2018  . Goals of care, counseling/discussion 09/05/2017  . Pneumonia 01/25/2017  . CAP (community acquired pneumonia) 01/23/2017  . Port catheter in place 05/30/2016  . 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 Chronic pain 04/13/2015  . Left arm pain 08/18/2014  . Malignant neoplasm of upper-inner quadrant of right breast in female, estrogen receptor positive ([place]) 10/03/2013  . Post-lymphadenectomy lymphedema of arm 08/15/2013  . Port or reservoir infection 01/29/2013  . DVT (deep venous thrombosis) ([place]) 10/07/2011  . Chest pain 09/10/2009  . Secondary cardiomyopathy ([place]) 09/02/2009  . Essential hypertension 02/26/2009  . GERD 02/26/2009   PCP:  [name] Cruel, MD Pharmacy:   [place], [place] [address] Phone: [phone] Fax: [phone]  [id number] [address] [place], [place] [id number] [place] [id number] Phone: [phone] Fax: [phone]  [place], [place] [address] [place] [address] Phone: [phone] Fax: [phone]  [facility] [place], [place] [address] Phone: [phone] Fax: [phone]     Social Determinants of Health (SDOH) Interventions    Readmission Risk Interventions No flowsheet data found.

## 2020-02-13 NOTE — Progress Notes (Signed)
Physical Therapy Treatment Patient Details Name: [name] MRN: [id number] DOB: 1956-07-23 Today's Date: 02/13/2020    History of Present Illness 64 yo female admitted with UTI, weakness, fall at home. Hx of met breast cancer s/p R mastectomy, on radiation, chronic pain, DVT, COVID, polyneuropathy    PT Comments    Pt assisted with ambulating in hallway and then able to remain OOB in recliner end of session.  Pt continuously asking if she can go home today.   Follow Up Recommendations  Home health PT;Supervision/Assistance - 24 hour;SNF(will need assist at home for safety; high fall risk)     Equipment Recommendations  Rolling walker with 5" wheels    Recommendations for Other Services       Precautions / Restrictions Precautions Precautions: Fall    Mobility  Bed Mobility Overal bed mobility: Needs Assistance Bed Mobility: Supine to Sit     Supine to sit: Min assist;HOB elevated     General bed mobility comments: slight assist for trunk upright; increased time and effort, max cues for completion  Transfers Overall transfer level: Needs assistance Equipment used: Rolling walker (2 wheeled) Transfers: Sit to/from Stand Sit to Stand: Min assist         General transfer comment: assist to rise and steady, verbal cues for hand placement  Ambulation/Gait Ambulation/Gait assistance: Min assist Gait Distance (Feet): 80 Feet Assistive device: Rolling walker (2 wheeled) Gait Pattern/deviations: Step-through pattern;Decreased stride length;Shuffle     General Gait Details: assist for steadying, shuffling pattern however able to improve with cues for "big steps" but pt unable to maintain; distance to tolerance   Stairs             Wheelchair Mobility    Modified Rankin (Stroke Patients Only)       Balance           Standing balance support: Bilateral upper extremity supported Standing balance-Leahy Scale: Poor Standing balance comment: relaint  on external support                            Cognition Arousal/Alertness: Awake/alert Behavior During Therapy: WFL for tasks assessed/performed Overall Cognitive Status: Impaired/Different from baseline                           Safety/Judgement: Decreased awareness of safety;Decreased awareness of deficits   Problem Solving: Slow processing;Requires verbal cues General Comments: some poor insight to deficits at times, mild confusion       Exercises      General Comments        Pertinent Vitals/Pain Pain Assessment: Faces Faces Pain Scale: Hurts a little bit Pain Location: back Pain Descriptors / Indicators: Discomfort;Sore Pain Intervention(s): Repositioned;Monitored during session    Home Living                      Prior Function            PT Goals (current goals can now be found in the care plan section) Progress towards PT goals: Progressing toward goals    Frequency    Min 3X/week      PT Plan Current plan remains appropriate    Co-evaluation              AM-PAC PT "6 Clicks" Mobility   Outcome Measure  Help needed turning from your back to your side while in a flat bed  without using bedrails?: A Little Help needed moving from lying on your back to sitting on the side of a flat bed without using bedrails?: A Little Help needed moving to and from a bed to a chair (including a wheelchair)?: A Little Help needed standing up from a chair using your arms (e.g., wheelchair or bedside chair)?: A Little Help needed to walk in hospital room?: A Little Help needed climbing 3-5 steps with a railing? : A Little 6 Click Score: 18    End of Session Equipment Utilized During Treatment: Gait belt Activity Tolerance: Patient limited by fatigue Patient left: in chair;with call bell/phone within reach;with chair alarm set Nurse Communication: Mobility status PT Visit Diagnosis: Muscle weakness (generalized)  (M62.81);Difficulty in walking, not elsewhere classified (R26.2);Unsteadiness on feet (R26.81)     Time: 3903-0092 PT Time Calculation (min) (ACUTE ONLY): 14 min  Charges:  $Gait Training: 8-22 mins                     Arlyce Dice, DPT Acute Rehabilitation Services Office: [phone]  York Ram E 02/13/2020, 1:53 PM

## 2020-02-13 NOTE — Progress Notes (Signed)
[facility]                                                                              Patient Demographics  [name], is a 64 y.o. female, DOB - Jun 29, 1956, [id number]  Admit date - 02/10/2020   Admitting Physician Rhetta Mura, DO  Outpatient Primary MD for the patient is [name], [name], MD  Outpatient specialists:   LOS - 2  days   Medical records reviewed and are as summarized below:    Chief Complaint  Patient presents with  . Leg Pain       Brief summary   64 y.o.BF PMHx breast cancer with metastasis to the brain, hypertension, DVT, presented with generalized weakness, increasing difficulty with ambulation, ADLs.  Patient reported dysuria for 2 days.  No focal neurological deficits.   No fevers or chills.  Patient also had exhibited some increased somnolence prior to admission. Of note, the patient underwent COVID-19 testing in January, with a positive COVID-19 antigen test on 11/19/2019. In ED, UA positive for UTI.  CT head negative for any acute stroke.   Assessment & Plan    Principal Problem: Proteus mirabilis UTI (urinary tract infection) -Urine culture showed more than 100,000 colonies of Proteus mirabilis -Continue IV Rocephin, follow sensitivity  Active Problems: Generalized weakness, lower extremity weakness -Patient has underlying history of metastatic breast cancer,  No metastasis noted on the T-spine. -MRI of the C-spine, lumbar spine completed, no metastasis -PT evaluation completed   Acute metabolic encephalopathy -Patient had increased somnolence at the time of admission -UA positive for UTI, UDS positive for benzodiazepines, opiates, THC -Patient is also on multiple psych medications including Xanax, gabapentin, methadone, as needed oxycodone, Zoloft -TSH 0.34, free T4 0.87 -This morning confused and agitated.  RN saw pill bottles in the patient's purse while she was reaching for her phone and refused to be  sent to the pharmacy.  Unclear if patient is surreptitiously taking her pills.  Requested the daughter to take her medications home -Overnight was delirious, placed on Seroquel tonight  Essential hypertension BP currently soft, hold triamterene HCTZ  GERD Continue PPI  History of breast CA, right -Oncology following, appreciate Dr. [name] follow-up   Code Status: Full CODE STATUS DVT Prophylaxis:  Lovenox  Family Communication: Discussed all imaging results, lab results, explained to the patient and patient's daughter on the phone   Disposition Plan: Patient from home, will likely need home health PT OT if family can provide 24/7 supervision versus SNF   Time Spent in minutes 25 minutes  Procedures:  None  Consultants:   Oncology  Antimicrobials:   Anti-infectives (From admission, onward)   Start     Dose/Rate Route Frequency Ordered Stop   02/11/20 0130  cefTRIAXone (ROCEPHIN) 1 g in sodium chloride 0.9 % 100 mL IVPB     1 g 200 mL/hr over 30 Minutes Intravenous Daily at bedtime 02/11/20 0113           Medications  Scheduled Meds: . allopurinol  300 mg Oral Daily  . Chlorhexidine Gluconate Cloth  6 each Topical Daily  . enoxaparin (LOVENOX) injection  40 mg Subcutaneous QHS  . multivitamin with minerals  1 tablet Oral Daily  . pantoprazole  40 mg Oral Daily  . potassium chloride SA  20 mEq Oral Daily  . QUEtiapine  25 mg Oral QHS  . sertraline  100 mg Oral Daily  . sodium chloride flush  3 mL Intravenous Q12H   Continuous Infusions: . cefTRIAXone (ROCEPHIN)  IV 1 g (02/12/20 2307)   PRN Meds:.acetaminophen **OR** acetaminophen, ondansetron (ZOFRAN) IV, sodium chloride flush      Subjective:   [name] was seen and examined today.  Overnight issues noted, was confused and had sundowning, pulled the needle out of her port.  This morning waxing and waning mental status.  Asking to go home.  Denies any chest pain, shortness of breath, abdominal  pain.  No fevers or chills   Objective:   Vitals:   02/12/20 0645 02/12/20 1535 02/12/20 2031 02/13/20 0517  BP: 99/61 107/60 115/74 115/67  Pulse: 85 77 75 74  Resp: 18 19 18 20   Temp: 99.2 F (37.3 C) 98.3 F (36.8 C) (!) 97.5 F (36.4 C) 98.5 F (36.9 C)  TempSrc: Oral Oral Oral Oral  SpO2: 100% 92% 94% 95%  Weight:      Height:        Intake/Output Summary (Last 24 hours) at 02/13/2020 1433 Last data filed at 02/13/2020 1000 Gross per 24 hour  Intake 830 ml  Output 275 ml  Net 555 ml     Wt Readings from Last 3 Encounters:  02/11/20 89 kg  02/03/20 89.7 kg  01/16/20 98 kg   Physical Exam  General: Waxing and waning mental status, alert and oriented at the time of my examination  Cardiovascular: S1 S2 clear, RRR. No pedal edema b/l  Respiratory: CTAB, no wheezing, rales or rhonchi  Gastrointestinal: Soft, nontender, nondistended, NBS  Ext: no pedal edema bilaterally  Neuro: no new deficits, lower extremities 4/5 bilaterally  Musculoskeletal: No cyanosis, clubbing  Skin: No rashes  Psych: Anxious, alert and oriented x3 at the time of my examination  Data Reviewed:  I have personally reviewed following labs and imaging studies  Micro Results Recent Results (from the past 240 hour(s))  Culture, blood (Routine X 2) w Reflex to ID Panel     Status: None (Preliminary result)   Collection Time: 02/11/20  1:13 AM   Specimen: BLOOD  Result Value Ref Range Status   Specimen Description   Final    BLOOD PORTA CATH Performed at [facility] [address], [place], [place] [address]    Special Requests   Final    BOTTLES DRAWN AEROBIC AND ANAEROBIC Blood Culture adequate volume Performed at [facility] [address], [place], [place] [address]    Culture   Final    NO GROWTH 2 DAYS Performed at [facility] [address] [place], [place] [address]    Report Status PENDING  Incomplete  Culture, Urine      Status: Abnormal (Preliminary result)   Collection Time: 02/11/20  1:18 AM   Specimen: Urine, Clean Catch  Result Value Ref Range Status   Specimen Description   Final    URINE, CLEAN CATCH Performed at [facility], [place] [address], [place], [place] [address]    Special Requests   Final    NONE Performed at [facility], [place] [address] [place], [place] [address]    Culture (A)  Final    >=  100,000 COLONIES/mL PROTEUS MIRABILIS SUSCEPTIBILITIES TO FOLLOW Performed at [facility], [place] [address], [place], [place] [address]    Report Status PENDING  Incomplete  Culture, blood (Routine X 2) w Reflex to ID Panel     Status: None (Preliminary result)   Collection Time: 02/11/20  4:55 AM   Specimen: BLOOD  Result Value Ref Range Status   Specimen Description   Final    BLOOD LEFT ARM Performed at [facility] [address], [place], [place] [address]    Special Requests   Final    BOTTLES DRAWN AEROBIC ONLY Blood Culture results may not be optimal due to an inadequate volume of blood received in culture bottles Performed at [facility] [address], [place], [place] [address]    Culture   Final    NO GROWTH 2 DAYS Performed at [facility] [address], [place], [place] [address]    Report Status PENDING  Incomplete    Radiology Reports DG Chest 2 View  Result Date: 02/10/2020 CLINICAL DATA:  Altered mental status. EXAM: CHEST - 2 VIEW COMPARISON:  January 25, 2020 FINDINGS: There is a well-positioned left-sided Port-A-Cath. The heart size is stable. Areas of scarring and atelectasis are noted bilaterally. There is no pneumothorax. There are degenerative changes throughout the visualized thoracic spine. IMPRESSION: No active cardiopulmonary disease. Electronically Signed   By: Constance Holster [name]   On: 02/10/2020 23:19   DG Chest 2 View  Result Date: 01/25/2020 CLINICAL DATA:  Cough  and shortness of breath. History of breast cancer. Patient is unsteady on her feet. Patient has difficulty holding still. EXAM: CHEST - 2 VIEW COMPARISON:  01/21/2020 FINDINGS: Patient's LEFT-sided power port, tip to the superior vena cava. Heart is UPPER limits normal. There are patchy infiltrates in the lungs persist, but there has been some improvement in aeration accounting for differences in technique. No pleural effusions. RIGHT axillary node dissection. Patient motion artifact. IMPRESSION: Persistent but improved bilateral infiltrates. Electronically Signed   By: Nolon Nations [name]   On: 01/25/2020 16:54   DG Chest 2 View  Result Date: 01/21/2020 CLINICAL DATA:  Leg swelling. Additional history provided: Bilateral lower extremity swelling, right arm swelling, history of breast cancer, multiple falls. EXAM: CHEST - 2 VIEW COMPARISON:  Chest radiographs 01/16/2020 and earlier FINDINGS: Stable position of a left chest infusion port catheter with tip projecting in the region of the right atrium. Heart size at the upper limits of normal, unchanged. Bilateral interstitial and airspace opacities, more conspicuous as compared to prior exam 01/16/2020. No evidence of pleural effusion or pneumothorax. Thoracic spondylosis. IMPRESSION: Bilateral interstitial and ill-defined airspace opacities, more conspicuous as compared to prior exam 01/16/2020. Findings may reflect pulmonary edema. Infection cannot be excluded. Electronically Signed   By: Kellie Simmering DO   On: 01/21/2020 11:42   DG Chest 2 View  Result Date: 01/16/2020 CLINICAL DATA:  Hypoxia and lower extremity edema EXAM: CHEST - 2 VIEW COMPARISON:  November 19, 2019. FINDINGS: Port-A-Cath tip is at the cavoatrial junction. No pneumothorax. Lungs are clear. Heart is upper normal in size with pulmonary vascularity normal. No adenopathy. There is degenerative change in the thoracic spine. IMPRESSION: Port-A-Cath tip at cavoatrial junction. Lungs clear.  Cardiac silhouette within normal limits. Electronically Signed   By: Lowella Grip III [name]   On: 01/16/2020 12:51   DG Forearm Right  Result Date: 01/21/2020 CLINICAL DATA:  Right arm pain and  swelling EXAM: RIGHT FOREARM - 2 VIEW COMPARISON:  None. FINDINGS: There is no evidence of fracture or other focal bone lesions. No cortical destruction or periostitis. Diffuse circumferential soft tissue swelling is evident throughout the forearm. No soft tissue gas or radiopaque foreign body is seen. IMPRESSION: Diffuse circumferential soft tissue swelling throughout the forearm. No acute bony abnormality. Electronically Signed   By: Davina Poke D.O.   On: 01/21/2020 11:44   DG Tibia/Fibula Left  Result Date: 01/21/2020 CLINICAL DATA:  Left leg pain EXAM: LEFT TIBIA AND FIBULA - 2 VIEW COMPARISON:  12/29/2010 FINDINGS: There is no evidence of fracture or other focal bone lesions. No cortical destruction or periostitis. Mineralization is again noted within the soft tissues medial to the mid tibial diaphysis, unchanged from prior study 12/29/2010. Soft tissues are otherwise unremarkable. IMPRESSION: Negative. Electronically Signed   By: Davina Poke D.O.   On: 01/21/2020 11:45   CT Head Wo Contrast  Result Date: 02/10/2020 CLINICAL DATA:  64 year old female with headache. History of metastatic breast cancer. EXAM: CT HEAD WITHOUT CONTRAST TECHNIQUE: Contiguous axial images were obtained from the base of the skull through the vertex without intravenous contrast. COMPARISON:  Head CT dated 01/16/2020. FINDINGS: Brain: The ventricles and sulci appropriate size for patient's age. Areas of white matter hypodensity involving the inferior right frontal and right temporal lobes similar to prior CT in keeping with metastatic disease. There is no acute intracranial hemorrhage. No mass effect or midline shift. No extra-axial fluid collection. Vascular: No hyperdense vessel or unexpected calcification. Skull:  Normal. Negative for fracture or focal lesion. Sinuses/Orbits: The visualized paranasal sinuses and the right mastoid air cells are clear. Mild left mastoid effusions. No air-fluid level. Other: None IMPRESSION: 1. No acute intracranial hemorrhage. 2. Similar appearance of right frontal and temporal white matter hypodensity in keeping with metastatic disease. Electronically Signed   By: Anner Crete [name]   On: 02/10/2020 23:10   CT Head Wo Contrast  Result Date: 01/16/2020 CLINICAL DATA:  Altered mental status. Positive COVID-19 test last month. Breast cancer metastatic to the brain. EXAM: CT HEAD WITHOUT CONTRAST TECHNIQUE: Contiguous axial images were obtained from the base of the skull through the vertex without intravenous contrast. COMPARISON:  01/12/2020. Brain MR dated 09/04/2019. FINDINGS: Brain: Stable areas of low density in the right frontal and temporal lobes at the locations of the patient's known masses. Stable mild patchy white matter low density in both cerebral hemispheres. No intracranial hemorrhage or CT evidence of acute infarction. No mass effect. Vascular: No hyperdense vessel or unexpected calcification. Skull: Normal. Negative for fracture or focal lesion. Sinuses/Orbits: Unremarkable Other: None. IMPRESSION: 1. No acute abnormality. 2. Stable areas of low density in the right frontal and temporal lobes at the locations of the patient's known masses compatible with metastases. 3. Stable mild chronic small vessel white matter ischemic changes in both cerebral hemispheres. Electronically Signed   By: Claudie Revering [name]   On: 01/16/2020 12:33   MR Brain W Wo Contrast  Result Date: 02/01/2020 CLINICAL DATA:  Follow-up breast cancer with brain metastases. Status post radiation therapy 11-10/2019. EXAM: MRI HEAD WITHOUT AND WITH CONTRAST TECHNIQUE: Multiplanar, multiecho pulse sequences of the brain and surrounding structures were obtained without and with intravenous contrast. CONTRAST:   7.46mL GADAVIST GADOBUTROL 1 MMOL/ML IV SOLN COMPARISON:  Head CT 01/16/2020 and MRI 09/04/2019 FINDINGS: The study is mildly motion degraded. Brain: A 2.8 x 2.0 x 1.9 cm (AP x transverse x craniocaudal) heterogeneously enhancing  right frontal mass and a 4.9 x 2.4 x 2.0 cm right temporal mass have both decreased in size from the prior MRI with both appearing partially cystic. There is persistent mild edema surrounding the right temporal lesion, while right frontal lobe edema has resolved. No new enhancing lesion is identified. There is no acute infarct, midline shift, or extra-axial fluid collection. Small foci of T2 hyperintensity in the cerebral white matter bilaterally have mildly increased and are nonspecific but compatible with mild chronic small vessel ischemic disease. There is a mildly expanded partially empty sella. There is mild cerebral atrophy. Vascular: Major intracranial vascular flow voids are preserved. Skull and upper cervical spine: No destructive skull lesion. Diffusely diminished bone marrow T1 signal intensity in the included cervical spine, nonspecific but may be related to the patient's known anemia. Sinuses/Orbits: Unremarkable orbits. Clear paranasal sinuses. Small left mastoid effusion. Other: None. IMPRESSION: 1. Decreased size of right frontal and right temporal lobe metastases. Mild residual right temporal edema without significant mass effect. 2. No evidence of new intracranial metastases or acute abnormality. Electronically Signed   By: Logan Bores [name]   On: 02/01/2020 16:44   MR CERVICAL SPINE WO CONTRAST  Result Date: 02/12/2020 CLINICAL DATA:  75 old female with history of metastatic breast cancer, with new onset bilateral lower extremity paresis. Evaluate for metastatic spinal involvement. EXAM: MRI CERVICAL AND LUMBAR SPINE WITHOUT CONTRAST TECHNIQUE: Multiplanar and multiecho pulse sequences of the cervical spine, to include the craniocervical junction and  cervicothoracic junction, and lumbar spine, were obtained without intravenous contrast. COMPARISON:  Prior PET-CT from 08/20/2019. FINDINGS: MRI CERVICAL SPINE FINDINGS Alignment: Examination moderately degraded by motion artifact. Mild straightening of the normal cervical lordosis. No listhesis or subluxation. Vertebrae: Vertebral body height maintained without evidence for acute or chronic fracture. Bone marrow signal intensity diffusely heterogeneous but within normal limits. No worrisome osseous lesions or metastatic disease identified on this motion degraded noncontrast examination. No abnormal marrow edema. Cord: Signal intensity within the cervical spinal cord grossly within normal limits allowing for motion artifact. Posterior Fossa, vertebral arteries, paraspinal tissues: Left mastoid effusion partially visualized. Visualized brain and posterior fossa within normal limits. Craniocervical junction normal. Superficial skin lesion partially seen involving the right upper back (series 9, image 38), better seen on previous thoracic spine MRI. Paraspinous and prevertebral soft tissues otherwise within normal limits. Normal intravascular flow voids seen within the vertebral arteries bilaterally. Disc levels: C2-C3: Shallow central disc protrusion mildly indents the ventral thecal sac. Protruding disc contacts the ventral spinal cord without significant stenosis or cord deformity. Superimposed mild left facet degeneration. No significant foraminal narrowing. C3-C4: Shallow broad-based central disc protrusion indents the ventral thecal sac, contacting the ventral spinal cord. Resultant mild spinal stenosis without definite cord deformity. Superimposed bilateral facet hypertrophy. Foramina remain widely patent. C4-C5: Mild disc bulge. Flattening and partial effacement of the ventral thecal sac without significant spinal stenosis or cord deformity. Foramina remain widely patent. C5-C6: Minimal disc bulge with endplate  and uncovertebral spurring. No significant spinal stenosis. Foramina remain patent. C6-C7: Mild uncovertebral hypertrophy without disc bulge. Left-sided facet degeneration. No significant spinal stenosis. Mild left C7 foraminal narrowing. No significant right foraminal stenosis. C7-T1: Negative interspace. Left greater than right facet hypertrophy. No spinal stenosis. Mild left C8 foraminal narrowing. Visualized upper thoracic spine within normal limits. MRI LUMBAR SPINE FINDINGS Segmentation: Examination mild to moderately degraded by motion artifact. Normal segmentation. Lowest well-formed disc space labeled the L5-S1 level. Alignment: Trace anterolisthesis of L4 on L5  and L5 on S1, chronic and facet mediated. Alignment otherwise normal with preservation of the normal lumbar lordosis. Vertebrae: Vertebral body height maintained without evidence for acute or chronic fracture. Bone marrow signal intensity within normal limits. No evidence for metastatic disease within the lumbar spine. No worrisome osseous lesions. Subcentimeter benign hemangioma noted within the L5 vertebral body. No abnormal marrow edema. Conus medullaris and cauda equina: Conus extends to the L1 level. Conus and cauda equina appear grossly normal allowing for motion artifact. Paraspinal and other soft tissues: Paraspinous soft tissues within normal limits. Partially visualized visceral structures are grossly unremarkable. Atherosclerotic change noted within the intra-abdominal aorta. No aneurysm. Disc levels: L1-2: Mild diffuse disc bulge with disc desiccation. Mild to moderate facet hypertrophy. Mild prominence of the dorsal epidural fat. No canal or foraminal stenosis. L2-3: Mild annular disc bulge with disc desiccation. Mild to moderate facet hypertrophy. Mild prominence of the dorsal epidural fat. No canal or foraminal stenosis. L3-4: Mild annular disc bulge with disc desiccation, slightly asymmetric to the right. Moderate facet and ligament  flavum hypertrophy. Borderline mild spinal stenosis. Mild bilateral L3 foraminal narrowing. L4-5: Trace anterolisthesis. No significant disc bulge. Moderate bilateral facet hypertrophy, slightly greater on the left. Prominence of the dorsal epidural fat. Borderline mild spinal stenosis. Mild right L4 foraminal stenosis. No significant left foraminal narrowing. L5-S1: Trace anterolisthesis. Disc desiccation. Shallow broad-based central disc protrusion closely approximates the descending S1 nerve roots without frank neural impingement or displacement. Moderate bilateral facet hypertrophy. Epidural lipomatosis. No significant canal or lateral recess stenosis. Mild bilateral L5 foraminal narrowing. IMPRESSION: MRI CERVICAL SPINE IMPRESSION: 1. No evidence for metastatic disease within the cervical spine on this noncontrast and motion degraded exam. 2. Indeterminate superficial skin lesion involving the right upper posterior thorax, partially visualized, and better seen on prior thoracic spine MRI from 02/11/2020. Finding also described on previous PET-CT from October of 2020. 3. Mild multilevel cervical spondylosis with resultant mild spinal stenosis at C3-4, with mild left C7 and C8 foraminal narrowing. MRI LUMBAR SPINE IMPRESSION: 1. No evidence for metastatic disease within the lumbar spine on this non-contrast and motion degraded exam. 2. Shallow central disc protrusion at L5-S1, closely approximating the descending S1 nerve roots without frank neural impingement 3. Mild to moderate facet hypertrophy throughout the lumbar spine, most pronounced at L4-5 and L5-S1. Finding could contribute to underlying lower back pain. Electronically Signed   By: Jeannine Boga [name]   On: 02/12/2020 23:05   MR THORACIC SPINE WO CONTRAST  Result Date: 02/11/2020 CLINICAL DATA:  64 year old female with metastatic breast cancer. Back pain. A total spine MRI without and with contrast was planned, but the patient was only able to  tolerate a noncontrast thoracic exam due to pain. EXAM: MRI THORACIC SPINE WITHOUT CONTRAST TECHNIQUE: Multiplanar, multisequence MR imaging of the thoracic spine was performed. No intravenous contrast was administered. COMPARISON:  CT cervical spine 11/19/2019. 08/20/2019 PET-CT. 05/03/2018 chest CT. FINDINGS: Limited cervical spine imaging: Straightening of cervical lordosis appears stable since January. Grossly normal bone marrow signal in the visible cervical spine. Thoracic spine segmentation:  Appears normal. Alignment:  Preserved thoracic kyphosis. No spondylolisthesis. Vertebrae: Thoracic bone marrow signal is within normal limits. No marrow edema or evidence of acute osseous abnormality. No suspicious thoracic vertebral lesion identified. However, there are questionable posterior rib lesions, such as on the right at the T6 and T8 levels series 21, images 19 and 25. Cord: Capacious thoracic spinal canal. Thoracic spinal cord and conus medullaris appear within  normal limits. The conus is at T12-L1. Paraspinal and other soft tissues: Chronic superficial skin and soft tissue nodularity to the right of midline from the T1 through T8 thoracic levels (series 21, image 13 and series 17, image 7). This appears grossly stable since 2019 and may be chronic scarring. However, there was described hypermetabolism here on a 08/20/2019 PET-CT Other thoracic paraspinal soft tissues are within normal limits. There is mildly heterogeneous signal in the visible left lung including a 12 mm nodular area on series 20, image 15. Trace right pleural effusion. Negative visible upper abdominal viscera. Disc levels: Mild for age thoracic spine degeneration with no spinal stenosis or neural foraminal stenosis. IMPRESSION: 1. No metastatic disease identified in the noncontrast thoracic spine. Mild for age thoracic spine degeneration with no spinal stenosis or neural impingement. 2. Questionable posterior rib lesions at the T6 and T8  levels. Consider follow-up Chest CT or Bone Scan. 3. Indeterminate left lung nodularity also, for which repeat chest CT would be most valuable. 4. Indeterminate superficial skin lesions over the right posterior thorax, roughly stable since 2019 but also described by PET-CT in October. Electronically Signed   By: Genevie Ann [name]   On: 02/11/2020 21:59   MR LUMBAR SPINE WO CONTRAST  Result Date: 02/12/2020 CLINICAL DATA:  73 old female with history of metastatic breast cancer, with new onset bilateral lower extremity paresis. Evaluate for metastatic spinal involvement. EXAM: MRI CERVICAL AND LUMBAR SPINE WITHOUT CONTRAST TECHNIQUE: Multiplanar and multiecho pulse sequences of the cervical spine, to include the craniocervical junction and cervicothoracic junction, and lumbar spine, were obtained without intravenous contrast. COMPARISON:  Prior PET-CT from 08/20/2019. FINDINGS: MRI CERVICAL SPINE FINDINGS Alignment: Examination moderately degraded by motion artifact. Mild straightening of the normal cervical lordosis. No listhesis or subluxation. Vertebrae: Vertebral body height maintained without evidence for acute or chronic fracture. Bone marrow signal intensity diffusely heterogeneous but within normal limits. No worrisome osseous lesions or metastatic disease identified on this motion degraded noncontrast examination. No abnormal marrow edema. Cord: Signal intensity within the cervical spinal cord grossly within normal limits allowing for motion artifact. Posterior Fossa, vertebral arteries, paraspinal tissues: Left mastoid effusion partially visualized. Visualized brain and posterior fossa within normal limits. Craniocervical junction normal. Superficial skin lesion partially seen involving the right upper back (series 9, image 38), better seen on previous thoracic spine MRI. Paraspinous and prevertebral soft tissues otherwise within normal limits. Normal intravascular flow voids seen within the vertebral  arteries bilaterally. Disc levels: C2-C3: Shallow central disc protrusion mildly indents the ventral thecal sac. Protruding disc contacts the ventral spinal cord without significant stenosis or cord deformity. Superimposed mild left facet degeneration. No significant foraminal narrowing. C3-C4: Shallow broad-based central disc protrusion indents the ventral thecal sac, contacting the ventral spinal cord. Resultant mild spinal stenosis without definite cord deformity. Superimposed bilateral facet hypertrophy. Foramina remain widely patent. C4-C5: Mild disc bulge. Flattening and partial effacement of the ventral thecal sac without significant spinal stenosis or cord deformity. Foramina remain widely patent. C5-C6: Minimal disc bulge with endplate and uncovertebral spurring. No significant spinal stenosis. Foramina remain patent. C6-C7: Mild uncovertebral hypertrophy without disc bulge. Left-sided facet degeneration. No significant spinal stenosis. Mild left C7 foraminal narrowing. No significant right foraminal stenosis. C7-T1: Negative interspace. Left greater than right facet hypertrophy. No spinal stenosis. Mild left C8 foraminal narrowing. Visualized upper thoracic spine within normal limits. MRI LUMBAR SPINE FINDINGS Segmentation: Examination mild to moderately degraded by motion artifact. Normal segmentation. Lowest well-formed disc space  labeled the L5-S1 level. Alignment: Trace anterolisthesis of L4 on L5 and L5 on S1, chronic and facet mediated. Alignment otherwise normal with preservation of the normal lumbar lordosis. Vertebrae: Vertebral body height maintained without evidence for acute or chronic fracture. Bone marrow signal intensity within normal limits. No evidence for metastatic disease within the lumbar spine. No worrisome osseous lesions. Subcentimeter benign hemangioma noted within the L5 vertebral body. No abnormal marrow edema. Conus medullaris and cauda equina: Conus extends to the L1 level.  Conus and cauda equina appear grossly normal allowing for motion artifact. Paraspinal and other soft tissues: Paraspinous soft tissues within normal limits. Partially visualized visceral structures are grossly unremarkable. Atherosclerotic change noted within the intra-abdominal aorta. No aneurysm. Disc levels: L1-2: Mild diffuse disc bulge with disc desiccation. Mild to moderate facet hypertrophy. Mild prominence of the dorsal epidural fat. No canal or foraminal stenosis. L2-3: Mild annular disc bulge with disc desiccation. Mild to moderate facet hypertrophy. Mild prominence of the dorsal epidural fat. No canal or foraminal stenosis. L3-4: Mild annular disc bulge with disc desiccation, slightly asymmetric to the right. Moderate facet and ligament flavum hypertrophy. Borderline mild spinal stenosis. Mild bilateral L3 foraminal narrowing. L4-5: Trace anterolisthesis. No significant disc bulge. Moderate bilateral facet hypertrophy, slightly greater on the left. Prominence of the dorsal epidural fat. Borderline mild spinal stenosis. Mild right L4 foraminal stenosis. No significant left foraminal narrowing. L5-S1: Trace anterolisthesis. Disc desiccation. Shallow broad-based central disc protrusion closely approximates the descending S1 nerve roots without frank neural impingement or displacement. Moderate bilateral facet hypertrophy. Epidural lipomatosis. No significant canal or lateral recess stenosis. Mild bilateral L5 foraminal narrowing. IMPRESSION: MRI CERVICAL SPINE IMPRESSION: 1. No evidence for metastatic disease within the cervical spine on this noncontrast and motion degraded exam. 2. Indeterminate superficial skin lesion involving the right upper posterior thorax, partially visualized, and better seen on prior thoracic spine MRI from 02/11/2020. Finding also described on previous PET-CT from October of 2020. 3. Mild multilevel cervical spondylosis with resultant mild spinal stenosis at C3-4, with mild left C7  and C8 foraminal narrowing. MRI LUMBAR SPINE IMPRESSION: 1. No evidence for metastatic disease within the lumbar spine on this non-contrast and motion degraded exam. 2. Shallow central disc protrusion at L5-S1, closely approximating the descending S1 nerve roots without frank neural impingement 3. Mild to moderate facet hypertrophy throughout the lumbar spine, most pronounced at L4-5 and L5-S1. Finding could contribute to underlying lower back pain. Electronically Signed   By: Jeannine Boga [name]   On: 02/12/2020 23:05   DG Hand Complete Right  Result Date: 01/21/2020 CLINICAL DATA:  Swelling, history breast cancer EXAM: RIGHT HAND - COMPLETE 3+ VIEW COMPARISON:  None FINDINGS: Osseous demineralization. Scattered mild degenerative changes greatest at first [facility] joint and IP joint thumb. Scattered soft tissue swelling. Fingers superimposed on lateral view limiting assessment. No acute fracture, dislocation or bone destruction. IMPRESSION: Scattered degenerative changes and soft tissue swelling. No acute osseous abnormalities. Electronically Signed   By: Lavonia Dana [name]   On: 01/21/2020 11:42   VAS [name] LOWER EXTREMITY VENOUS (DVT) ([facility] and [facility] 7a-7p)  Result Date: 01/21/2020  Lower Venous DVTStudy Indications: Pain, and Swelling.  Limitations: Poor ultrasound/tissue interface and patient positioning, patient movement. Comparison Study: 01/16/2020 - Negative for DVT. Performing Technologist: Oliver Hum RVT  Examination Guidelines: A complete evaluation includes B-mode imaging, spectral Doppler, color Doppler, and power Doppler as needed of all accessible portions of each vessel. Bilateral testing is considered an integral part of  a complete examination. Limited examinations for reoccurring indications may be performed as noted. The reflux portion of the exam is performed with the patient in reverse Trendelenburg.  +---------+---------------+---------+-----------+----------+--------------+ RIGHT     CompressibilityPhasicitySpontaneityPropertiesThrombus Aging +---------+---------------+---------+-----------+----------+--------------+ CFV      Full           Yes      Yes                                 +---------+---------------+---------+-----------+----------+--------------+ SFJ      Full                                                        +---------+---------------+---------+-----------+----------+--------------+ FV Prox  Full                                                        +---------+---------------+---------+-----------+----------+--------------+ FV Mid   Full                                                        +---------+---------------+---------+-----------+----------+--------------+ FV DistalFull                                                        +---------+---------------+---------+-----------+----------+--------------+ PFV      Full                                                        +---------+---------------+---------+-----------+----------+--------------+ POP      Full           Yes      Yes                                 +---------+---------------+---------+-----------+----------+--------------+ PTV      Full                                                        +---------+---------------+---------+-----------+----------+--------------+ PERO     Full                                                        +---------+---------------+---------+-----------+----------+--------------+   +---------+---------------+---------+-----------+----------+--------------+ LEFT     CompressibilityPhasicitySpontaneityPropertiesThrombus Aging +---------+---------------+---------+-----------+----------+--------------+ CFV      Full  Yes      Yes                                 +---------+---------------+---------+-----------+----------+--------------+ SFJ      Full                                                         +---------+---------------+---------+-----------+----------+--------------+ FV Prox  Full                                                        +---------+---------------+---------+-----------+----------+--------------+ FV Mid   Full                                                        +---------+---------------+---------+-----------+----------+--------------+ FV DistalFull                                                        +---------+---------------+---------+-----------+----------+--------------+ PFV      Full                                                        +---------+---------------+---------+-----------+----------+--------------+ POP      Full           Yes      Yes                                 +---------+---------------+---------+-----------+----------+--------------+ PTV      Full                                                        +---------+---------------+---------+-----------+----------+--------------+ PERO     Full                                                        +---------+---------------+---------+-----------+----------+--------------+     Summary: RIGHT: - There is no evidence of deep vein thrombosis in the lower extremity.  - No cystic structure found in the popliteal fossa.  LEFT: - There is no evidence of deep vein thrombosis in the lower extremity.  - No cystic structure found in the popliteal fossa.  *See table(s) above for measurements and observations. Electronically signed by Harold Barban MD on 01/21/2020 at 9:44:28  PM.    Final    VAS [name] LOWER EXTREMITY VENOUS (DVT) (ONLY [facility] & [facility])  Result Date: 01/16/2020  Lower Venous DVTStudy Indications: Swelling.  Limitations: Body habitus and poor ultrasound/tissue interface. Comparison Study: No prior exam. Performing Technologist: Baldwin Crown ARDMS, RVT  Examination Guidelines: A complete evaluation includes B-mode imaging, spectral Doppler, color  Doppler, and power Doppler as needed of all accessible portions of each vessel. Bilateral testing is considered an integral part of a complete examination. Limited examinations for reoccurring indications may be performed as noted. The reflux portion of the exam is performed with the patient in reverse Trendelenburg.  +---------+---------------+---------+-----------+----------+------------------+ RIGHT    CompressibilityPhasicitySpontaneityPropertiesThrombus Aging     +---------+---------------+---------+-----------+----------+------------------+ CFV      Full           Yes      Yes                                     +---------+---------------+---------+-----------+----------+------------------+ SFJ      Full                                                            +---------+---------------+---------+-----------+----------+------------------+ FV Prox  Full                                                            +---------+---------------+---------+-----------+----------+------------------+ FV Mid   Full                                                            +---------+---------------+---------+-----------+----------+------------------+ FV DistalFull                                                            +---------+---------------+---------+-----------+----------+------------------+ PFV      Full                                                            +---------+---------------+---------+-----------+----------+------------------+ POP      Full           Yes      Yes                                     +---------+---------------+---------+-----------+----------+------------------+ PTV      Full  visualized with                                                          color              +---------+---------------+---------+-----------+----------+------------------+ PERO     Full                                          visualized with                                                          color              +---------+---------------+---------+-----------+----------+------------------+   +---------+---------------+---------+-----------+----------+------------------+ LEFT     CompressibilityPhasicitySpontaneityPropertiesThrombus Aging     +---------+---------------+---------+-----------+----------+------------------+ CFV      Full           Yes      Yes                                     +---------+---------------+---------+-----------+----------+------------------+ SFJ      Full                                                            +---------+---------------+---------+-----------+----------+------------------+ FV Prox  Full                                                            +---------+---------------+---------+-----------+----------+------------------+ FV Mid   Full                                                            +---------+---------------+---------+-----------+----------+------------------+ FV DistalFull                                         visualized with                                                          color              +---------+---------------+---------+-----------+----------+------------------+ PFV      Full                                                            +---------+---------------+---------+-----------+----------+------------------+  POP      Full           Yes      Yes                                     +---------+---------------+---------+-----------+----------+------------------+ PTV      Full                                         visualized with                                                          color              +---------+---------------+---------+-----------+----------+------------------+ PERO     Full                                          visualized with                                                          color              +---------+---------------+---------+-----------+----------+------------------+     Summary: BILATERAL: - No evidence of deep vein thrombosis seen in the lower extremities, bilaterally.  RIGHT: - Portions of this examination were limited- see technologist comments above. - No cystic structure found in the popliteal fossa.  LEFT: - Portions of this examination were limited- see technologist comments above. - No cystic structure found in the popliteal fossa.  *See table(s) above for measurements and observations. Electronically signed by Monica Martinez MD on 01/16/2020 at 4:22:13 PM.    Final     Lab Data:  CBC: Recent Labs  Lab 02/10/20 1552 02/11/20 0707 02/12/20 0519 02/13/20 0708  WBC 7.5 5.7 3.7* 3.4*  NEUTROABS 5.6 3.7 2.0 1.6*  HGB 10.4* 9.9* 9.7* 9.7*  HCT 33.3* 31.5* 31.3* 31.4*  MCV 91.7 91.8 90.5 92.1  PLT 239 237 245 [id number]   Basic Metabolic Panel: Recent Labs  Lab 02/10/20 1552 02/11/20 0707 02/12/20 0519 02/13/20 0708  NA 136 139 140 137  K 3.2* 3.4* 3.6 4.0  CL 101 102 104 100  CO2 25 25 26 25   GLUCOSE 99 93 98 83  BUN 16 18 17 13   CREATININE 0.90 1.19* 0.84 0.89  CALCIUM 8.9 8.9 9.1 9.0  MG  --  1.4* 1.4* 1.7  PHOS  --   --  3.1 3.5   GFR: Estimated Creatinine Clearance: 73.2 mL/min (by C-G formula based on SCr of 0.89 mg/dL). Liver Function Tests: Recent Labs  Lab 02/10/20 1552 02/11/20 0707 02/12/20 0519 02/13/20 0708  AST 51* 44* 37 38  ALT 26 24 21 21   ALKPHOS 89 79 77 72  BILITOT 0.8 0.4 0.3 0.4  PROT 7.1 6.6 6.7 6.4*  ALBUMIN 3.0* 2.8* 2.8* 2.7*   No results  for input(s): LIPASE, AMYLASE in the last 168 hours. No results for input(s): AMMONIA in the last 168 hours. Coagulation Profile: No results for input(s): INR, PROTIME in the last 168 hours. Cardiac Enzymes: No results for input(s): CKTOTAL, CKMB, CKMBINDEX, TROPONINI in the last 168  hours. BNP (last 3 results) No results for input(s): PROBNP in the last 8760 hours. HbA1C: No results for input(s): HGBA1C in the last 72 hours. CBG: No results for input(s): GLUCAP in the last 168 hours. Lipid Profile: No results for input(s): CHOL, HDL, LDLCALC, TRIG, CHOLHDL, LDLDIRECT in the last 72 hours. Thyroid Function Tests: Recent Labs    02/11/20 0707 02/12/20 1110  TSH 0.348*  --   FREET4  --  0.87   Anemia Panel: No results for input(s): VITAMINB12, FOLATE, FERRITIN, TIBC, IRON, RETICCTPCT in the last 72 hours. Urine analysis:    Component Value Date/Time   COLORURINE AMBER (A) 02/10/2020 2135   APPEARANCEUR CLOUDY (A) 02/10/2020 2135   LABSPEC 1.019 02/10/2020 2135   PHURINE 6.0 02/10/2020 2135   GLUCOSEU NEGATIVE 02/10/2020 2135   HGBUR NEGATIVE 02/10/2020 2135   BILIRUBINUR NEGATIVE 02/10/2020 2135   KETONESUR NEGATIVE 02/10/2020 2135   PROTEINUR 100 (A) 02/10/2020 2135   UROBILINOGEN 0.2 12/29/2010 1427   NITRITE NEGATIVE 02/10/2020 2135   LEUKOCYTESUR MODERATE (A) 02/10/2020 2135     Adams Hinch [name] [facility] 02/13/2020, 2:33 PM   Call night coverage person covering after 7pm

## 2020-02-13 NOTE — TOC Progression Note (Signed)
Transition of Care [facility]) - Progression Note    Patient Details  Name: [name] MRN: [id number] Date of Birth: November 26, 1955  [facility]) CM/SW Contact  Marylyn Appenzeller, Juliann Pulse, RN Phone Number: 02/13/2020, 10:30 AM  Clinical Narrative: Per nsg dtr [name] is the contact-left vm w/CM call back tel# to confirm d/c plan & services.Noted [facility] rep Tommi Rumps can provide HHPT/OT-will need to confirm w/dtr.           Expected Discharge Plan and Services                                                 Social Determinants of Health (SDOH) Interventions    Readmission Risk Interventions No flowsheet data found.

## 2020-02-13 NOTE — Progress Notes (Signed)
Patient confused, calling out for her daughters, repeatedly trying to get out of bed without assistance. Redirected and explained to patient that it was not safe for her to get up alone. She is in agreement but then will continue to try. Pt stated "yeah, I'll go in half with you on those pizzas." Also this RN saw pill bottles in patient's purse while patient was reaching for her phone. Asked if patient would allow them to be sent to the pharmacy but she refused. Dr. Tana Coast made aware.

## 2020-02-13 NOTE — TOC Progression Note (Signed)
Transition of Care [facility]) - Progression Note    Patient Details  Name: [name] MRN: [id number] Date of Birth: 31-Oct-1956  Transition of Care [facility]) CM/SW Contact  Brilee Port, Juliann Pulse, RN Phone Number: 02/13/2020, 10:37 AM  Clinical Narrative: Noted referral for SNF-will need to confirm w/dtr if agreeable to SNF.           Expected Discharge Plan and Services                                                 Social Determinants of Health (SDOH) Interventions    Readmission Risk Interventions No flowsheet data found.

## 2020-02-14 LAB — URINE CULTURE: Culture: >=100000 — AB

## 2020-02-14 LAB — CBC WITH DIFFERENTIAL/PLATELET
Abs Immature Granulocytes: 0.19 10*3/uL — ABNORMAL HIGH (ref 0.00–0.07)
Basophils Absolute: 0 10*3/uL (ref 0.0–0.1)
Basophils Relative: 1 %
Eosinophils Absolute: 0.2 10*3/uL (ref 0.0–0.5)
Eosinophils Relative: 6 %
HCT: 33.5 % — ABNORMAL LOW (ref 36.0–46.0)
Hemoglobin: 10.1 g/dL — ABNORMAL LOW (ref 12.0–15.0)
Immature Granulocytes: 6 %
Lymphocytes Relative: 25 %
Lymphs Abs: 0.8 10*3/uL (ref 0.7–4.0)
MCH: 28.1 pg (ref 26.0–34.0)
MCHC: 30.1 g/dL (ref 30.0–36.0)
MCV: 93.3 fL (ref 80.0–100.0)
Monocytes Absolute: 0.6 10*3/uL (ref 0.1–1.0)
Monocytes Relative: 18 %
Neutro Abs: 1.4 10*3/uL — ABNORMAL LOW (ref 1.7–7.7)
Neutrophils Relative %: 44 %
Platelets: 250 10*3/uL (ref 150–400)
RBC: 3.59 MIL/uL — ABNORMAL LOW (ref 3.87–5.11)
RDW: 16.8 % — ABNORMAL HIGH (ref 11.5–15.5)
WBC: 3.1 10*3/uL — ABNORMAL LOW (ref 4.0–10.5)
nRBC: 0 % (ref 0.0–0.2)

## 2020-02-14 LAB — COMPREHENSIVE METABOLIC PANEL
ALT: 18 U/L (ref 0–44)
AST: 32 U/L (ref 15–41)
Albumin: 2.7 g/dL — ABNORMAL LOW (ref 3.5–5.0)
Alkaline Phosphatase: 69 U/L (ref 38–126)
Anion gap: 16 — ABNORMAL HIGH (ref 5–15)
BUN: 11 mg/dL (ref 8–23)
CO2: 25 mmol/L (ref 22–32)
Calcium: 8.9 mg/dL (ref 8.9–10.3)
Chloride: 99 mmol/L (ref 98–111)
Creatinine, Ser: 0.89 mg/dL (ref 0.44–1.00)
GFR calc Af Amer: >60 mL/min (ref >60)
GFR calc non Af Amer: >60 mL/min (ref >60)
Glucose, Bld: 90 mg/dL (ref 70–99)
Potassium: 3.8 mmol/L (ref 3.5–5.1)
Sodium: 140 mmol/L (ref 135–145)
Total Bilirubin: 0.3 mg/dL (ref 0.3–1.2)
Total Protein: 6.3 g/dL — ABNORMAL LOW (ref 6.5–8.1)

## 2020-02-14 LAB — MAGNESIUM: Magnesium: 1.4 mg/dL — ABNORMAL LOW (ref 1.7–2.4)

## 2020-02-14 LAB — PHOSPHORUS: Phosphorus: 3.7 mg/dL (ref 2.5–4.6)

## 2020-02-14 MED ORDER — MAGNESIUM OXIDE 400 (241.3 MG) MG PO TABS
400.0000 mg | ORAL_TABLET | Freq: Two times a day (BID) | ORAL | Status: DC
  Administered 2020-02-14: 400 mg via ORAL
  Filled 2020-02-14: qty 1

## 2020-02-14 MED ORDER — OXYCODONE HCL 20 MG PO TABS: 10.0000 mg | ORAL_TABLET | Freq: Three times a day (TID) | ORAL | 0 refills | Status: DC | PRN

## 2020-02-14 MED ORDER — CEPHALEXIN 500 MG PO CAPS: 500.0000 mg | ORAL_CAPSULE | Freq: Two times a day (BID) | ORAL | 0 refills | Status: AC

## 2020-02-14 MED ORDER — METHADONE HCL 10 MG PO TABS: 10.0000 mg | ORAL_TABLET | Freq: Every day | ORAL | 0 refills | Status: DC

## 2020-02-14 MED ORDER — HEPARIN SOD (PORK) LOCK FLUSH 100 UNIT/ML IV SOLN
500.0000 [IU] | INTRAVENOUS | Status: AC | PRN
  Administered 2020-02-14: 12:00:00 500 [IU]
  Filled 2020-02-14: qty 5

## 2020-02-14 MED ORDER — MAGNESIUM OXIDE 400 (241.3 MG) MG PO TABS: 400.0000 mg | ORAL_TABLET | Freq: Two times a day (BID) | ORAL | 0 refills | Status: AC

## 2020-02-14 NOTE — Progress Notes (Signed)
AVS reviewed with patient and patient's daughter.  Verbalized understanding of discharge instructions, physician follow-up, medications.  IV team deaccessed patient's chest port.  Patient transported via wheelchair to main entrance at discharge.  Patient stable at time of discharge.

## 2020-02-14 NOTE — Progress Notes (Signed)
NT in room and saw 2 pill bottles in patient's hand.  Patient reported that her sister left them here.  NT notified RN.  RN enter room and saw two pill bottles beside of patient in bed.  Patient states that her sister left the pill bottles here.  RN asked patient if she could take the medications and store them in the pharmacy.  Patient refused.  Patient alert and oriented.  Patient educated to not take any medication from home.  Patient verbalized understanding.  Dr. Tana Coast and Charge RN notified.  Patient to be discharged home today.

## 2020-02-14 NOTE — Care Management Important Message (Signed)
Important Message  Patient Details IM Letter given to [name] RN Case Manager to present to the Patient Name: [name] MRN: [id number] Date of Birth: 1955/12/17   Medicare Important Message Given:  Yes     Kerin Salen 02/14/2020, 10:51 AM

## 2020-02-14 NOTE — Discharge Summary (Signed)
Physician Discharge Summary   Patient ID: [name] MRN: [id number] DOB/AGE: [birth year] 64 y.o.  Admit date: 02/10/2020 Discharge date: 02/14/2020  Primary Care Physician:  Chauncey Cruel, MD   Recommendations for Outpatient Follow-up:  1. Follow up with PCP in 1-2 weeks 2. Continue Keflex 500 mg twice daily for 3 days  Home Health: Home home health PT Equipment/Devices:   Discharge Condition: stable  CODE STATUS: FULL Diet recommendation: Heart healthy diet   Discharge Diagnoses:    . Proteus mirabilis UTI (urinary tract infection) . Acute metabolic encephalopathy, likely due to UTI and polypharmacy . Hypokalemia . Essential hypertension . GERD . Generalized debility, lower extremity weakness . History of breast cancer, right with metastasis to brain [facility])   Consults: Oncology, Dr. Jana Hakim    Allergies:   Allergies  Allergen Reactions  . Tramadol Nausea Only  . Hydrocodone-Acetaminophen Itching and Rash  . Pork-Derived Products Other (See Comments)    Pt says she just doesn't eat pork; has no reaction to pork products     DISCHARGE MEDICATIONS: Allergies as of 02/14/2020      Reactions   Tramadol Nausea Only   Hydrocodone-acetaminophen Itching, Rash   Pork-derived Products Other (See Comments)   Pt says she just doesn't eat pork; has no reaction to pork products      Medication List    STOP taking these medications   furosemide 20 MG tablet Commonly known as: LASIX   Klor-Con M20 20 MEQ tablet Generic drug: potassium chloride SA   senna-docusate 8.6-50 MG tablet Commonly known as: Senokot-S   triamterene-hydrochlorothiazide 37.5-25 MG capsule Commonly known as: DYAZIDE     TAKE these medications   allopurinol 300 MG tablet Commonly known as: ZYLOPRIM Take 300 mg by mouth daily.   ALPRAZolam 1 MG tablet Commonly known as: XANAX Take 1 tablet (1 mg total) by mouth at bedtime as needed for sleep.   cephALEXin 500 MG capsule Commonly  known as: KEFLEX Take 1 capsule (500 mg total) by mouth 2 (two) times daily for 3 days. Start taking on: February 15, 2020   cholecalciferol 25 MCG (1000 UNIT) tablet Commonly known as: VITAMIN D3 Take 1,000 Units by mouth daily.   cholecalciferol 1000 units tablet Commonly known as: VITAMIN D Take 1 tablet (1,000 Units total) by mouth daily.   gabapentin 100 MG capsule Commonly known as: NEURONTIN Take 100 mg by mouth 3 (three) times daily.   magic mouthwash Soln Take 10 mLs by mouth 4 (four) times daily as needed for mouth pain. Swish and spit.   magnesium oxide 400 (241.3 Mg) MG tablet Commonly known as: MAG-OX Take 1 tablet (400 mg total) by mouth 2 (two) times daily for 5 days.   methadone 10 MG tablet Commonly known as: DOLOPHINE Take 1 tablet (10 mg total) by mouth daily. Taking once a day What changed:   when to take this  additional instructions   multivitamin capsule Take 1 capsule by mouth daily.   naloxone 4 MG/0.1ML Liqd nasal spray kit Commonly known as: NARCAN Administer single spray of naloxone into one nostril. Additionally, spray can be given every 2-3 minutes as needed until emergency medical assistance arrives. Call 911 immediately after use.   omeprazole 40 MG capsule Commonly known as: PRILOSEC Take 1 capsule (40 mg total) by mouth daily.   ondansetron 4 MG tablet Commonly known as: ZOFRAN TAKE 1 TABLET BY MOUTH THREE TIMES A DAY AS NEEDED What changed:   when to take this  reasons to take this   Oxycodone HCl 20 MG Tabs Take 0.5 tablets (10 mg total) by mouth 3 (three) times daily as needed (pain). What changed: how much to take   polyethylene glycol 17 g packet Commonly known as: MIRALAX / GLYCOLAX Take 17 g by mouth daily as needed for mild constipation.   prochlorperazine 10 MG tablet Commonly known as: COMPAZINE Take 1 tablet (10 mg total) by mouth every 6 (six) hours as needed for nausea or vomiting.   sertraline 100 MG  tablet Commonly known as: ZOLOFT TAKE 1 TABLET BY MOUTH EVERY DAY   tucatinib 150 MG tablet Commonly known as: TUKYSA Take 1 tablet (150 mg total) by mouth every morning. Take as directed by MD.   VITAMIN C PO Take 1 tablet by mouth daily.        Brief H and P: For complete details please refer to admission H and P, but in brief 64 y.o.BF PMHxbreast cancer with metastasis to the brain, hypertension,DVT, presented with generalized weakness, increasing difficulty with ambulation, ADLs.  Patient reported dysuria for 2 days.  No focal neurological deficits.   No fevers or chills.  Patient also had exhibited some increased somnolence prior to admission. Of note, the patient underwent COVID-19 testing in January, with a positive COVID-19 antigen test on 11/19/2019. In ED, UA positive for UTI.  CT head negative for any acute stroke.  Hospital Course:   Proteus mirabilis UTI (urinary tract infection) -Urine culture showed more than 100,000 colonies of Proteus mirabilis -Patient was started on IV Rocephin while inpatient, transition to oral Keflex to complete full course   Generalized weakness, lower extremity weakness -Patient has underlying history of metastatic breast cancer,  No metastasis noted on the T-spine, C-spine, lumbar spine completed, no metastasis -PT evaluation completed recommended home health PT OT, DME rolling walker   Acute metabolic encephalopathy -Likely due to UTI and polypharmacy -UA positive for UTI, UDS positive for benzodiazepines, opiates, THC -Patient is also on multiple psych medications including Xanax, gabapentin, methadone, as needed oxycodone, Zoloft -TSH 0.34, free T4 0.87 -RN saw pill bottles in the patient's purse while she was reaching for her phone and refused to be sent to the pharmacy, unclear if patient was surreptitiously taking her pills.   This was discussed in detail with patient's daughter/H POA [name] and discussed the risks with  polypharmacy.  All the medications for discharge were discussed with the daughter and doses were reduced especially for the methadone and oxycodone.  Per the daughter, she will take care of all the patient's medications herself and discussed with her outpatient primary physicians.  Essential hypertension BP stable, continue to hold triamterene HCTZ  GERD Continue PPI  History of breast CA, right -Oncology following, appreciate Dr. [name] assistance.  Follow-up as scheduled outpatient   Day of Discharge S: Currently alert and awake, no acute issues.  No fevers or chills  BP 126/80 (BP Location: Left Arm)   Pulse 87   Temp 98.3 F (36.8 C) (Oral)   Resp 20   Ht '5\' 7"'$  (1.702 m)   Wt 90.8 kg   SpO2 95%   BMI 31.35 kg/m   Physical Exam: General: Alert and awake oriented x3 not in any acute distress. HEENT: anicteric sclera, pupils reactive to light and accommodation CVS: S1-S2 clear no murmur rubs or gallops Chest: clear to auscultation bilaterally, no wheezing rales or rhonchi Abdomen: soft nontender, nondistended, normal bowel sounds Extremities: no cyanosis, clubbing or edema noted bilaterally  Neuro: Cranial nerves II-XII intact, no focal neurological deficits    Get Medicines reviewed and adjusted: Please take all your medications with you for your next visit with your Primary MD  Please request your Primary MD to go over all hospital tests and procedure/radiological results at the follow up. Please ask your Primary MD to get all Hospital records sent to his/her office.  If you experience worsening of your admission symptoms, develop shortness of breath, life threatening emergency, suicidal or homicidal thoughts you must seek medical attention immediately by calling 911 or calling your MD immediately  if symptoms less severe.  You must read complete instructions/literature along with all the possible adverse reactions/side effects for all the Medicines you take and  that have been prescribed to you. Take any new Medicines after you have completely understood and accept all the possible adverse reactions/side effects.   Do not drive when taking pain medications.   Do not take more than prescribed Pain, Sleep and Anxiety Medications  Special Instructions: If you have smoked or chewed Tobacco  in the last 2 yrs please stop smoking, stop any regular Alcohol  and or any Recreational drug use.  Wear Seat belts while driving.  Please note  You were cared for by a hospitalist during your hospital stay. Once you are discharged, your primary care physician will handle any further medical issues. Please note that NO REFILLS for any discharge medications will be authorized once you are discharged, as it is imperative that you return to your primary care physician (or establish a relationship with a primary care physician if you do not have one) for your aftercare needs so that they can reassess your need for medications and monitor your lab values.   The results of significant diagnostics from this hospitalization (including imaging, microbiology, ancillary and laboratory) are listed below for reference.      Procedures/Studies:  DG Chest 2 View  Result Date: 02/10/2020 CLINICAL DATA:  Altered mental status. EXAM: CHEST - 2 VIEW COMPARISON:  January 25, 2020 FINDINGS: There is a well-positioned left-sided Port-A-Cath. The heart size is stable. Areas of scarring and atelectasis are noted bilaterally. There is no pneumothorax. There are degenerative changes throughout the visualized thoracic spine. IMPRESSION: No active cardiopulmonary disease. Electronically Signed   By: Constance Holster [name]   On: 02/10/2020 23:19   DG Chest 2 View  Result Date: 01/25/2020 CLINICAL DATA:  Cough and shortness of breath. History of breast cancer. Patient is unsteady on her feet. Patient has difficulty holding still. EXAM: CHEST - 2 VIEW COMPARISON:  01/21/2020 FINDINGS: Patient's  LEFT-sided power port, tip to the superior vena cava. Heart is UPPER limits normal. There are patchy infiltrates in the lungs persist, but there has been some improvement in aeration accounting for differences in technique. No pleural effusions. RIGHT axillary node dissection. Patient motion artifact. IMPRESSION: Persistent but improved bilateral infiltrates. Electronically Signed   By: Nolon Nations [name]   On: 01/25/2020 16:54   DG Chest 2 View  Result Date: 01/21/2020 CLINICAL DATA:  Leg swelling. Additional history provided: Bilateral lower extremity swelling, right arm swelling, history of breast cancer, multiple falls. EXAM: CHEST - 2 VIEW COMPARISON:  Chest radiographs 01/16/2020 and earlier FINDINGS: Stable position of a left chest infusion port catheter with tip projecting in the region of the right atrium. Heart size at the upper limits of normal, unchanged. Bilateral interstitial and airspace opacities, more conspicuous as compared to prior exam 01/16/2020. No evidence of pleural effusion  or pneumothorax. Thoracic spondylosis. IMPRESSION: Bilateral interstitial and ill-defined airspace opacities, more conspicuous as compared to prior exam 01/16/2020. Findings may reflect pulmonary edema. Infection cannot be excluded. Electronically Signed   By: Kellie Simmering DO   On: 01/21/2020 11:42   DG Chest 2 View  Result Date: 01/16/2020 CLINICAL DATA:  Hypoxia and lower extremity edema EXAM: CHEST - 2 VIEW COMPARISON:  November 19, 2019. FINDINGS: Port-A-Cath tip is at the cavoatrial junction. No pneumothorax. Lungs are clear. Heart is upper normal in size with pulmonary vascularity normal. No adenopathy. There is degenerative change in the thoracic spine. IMPRESSION: Port-A-Cath tip at cavoatrial junction. Lungs clear. Cardiac silhouette within normal limits. Electronically Signed   By: Lowella Grip III [name]   On: 01/16/2020 12:51   DG Forearm Right  Result Date: 01/21/2020 CLINICAL DATA:  Right  arm pain and swelling EXAM: RIGHT FOREARM - 2 VIEW COMPARISON:  None. FINDINGS: There is no evidence of fracture or other focal bone lesions. No cortical destruction or periostitis. Diffuse circumferential soft tissue swelling is evident throughout the forearm. No soft tissue gas or radiopaque foreign body is seen. IMPRESSION: Diffuse circumferential soft tissue swelling throughout the forearm. No acute bony abnormality. Electronically Signed   By: Davina Poke D.O.   On: 01/21/2020 11:44   DG Tibia/Fibula Left  Result Date: 01/21/2020 CLINICAL DATA:  Left leg pain EXAM: LEFT TIBIA AND FIBULA - 2 VIEW COMPARISON:  12/29/2010 FINDINGS: There is no evidence of fracture or other focal bone lesions. No cortical destruction or periostitis. Mineralization is again noted within the soft tissues medial to the mid tibial diaphysis, unchanged from prior study 12/29/2010. Soft tissues are otherwise unremarkable. IMPRESSION: Negative. Electronically Signed   By: Davina Poke D.O.   On: 01/21/2020 11:45   CT Head Wo Contrast  Result Date: 02/10/2020 CLINICAL DATA:  65 year old female with headache. History of metastatic breast cancer. EXAM: CT HEAD WITHOUT CONTRAST TECHNIQUE: Contiguous axial images were obtained from the base of the skull through the vertex without intravenous contrast. COMPARISON:  Head CT dated 01/16/2020. FINDINGS: Brain: The ventricles and sulci appropriate size for patient's age. Areas of white matter hypodensity involving the inferior right frontal and right temporal lobes similar to prior CT in keeping with metastatic disease. There is no acute intracranial hemorrhage. No mass effect or midline shift. No extra-axial fluid collection. Vascular: No hyperdense vessel or unexpected calcification. Skull: Normal. Negative for fracture or focal lesion. Sinuses/Orbits: The visualized paranasal sinuses and the right mastoid air cells are clear. Mild left mastoid effusions. No air-fluid level.  Other: None IMPRESSION: 1. No acute intracranial hemorrhage. 2. Similar appearance of right frontal and temporal white matter hypodensity in keeping with metastatic disease. Electronically Signed   By: Anner Crete [name]   On: 02/10/2020 23:10   CT Head Wo Contrast  Result Date: 01/16/2020 CLINICAL DATA:  Altered mental status. Positive COVID-19 test last month. Breast cancer metastatic to the brain. EXAM: CT HEAD WITHOUT CONTRAST TECHNIQUE: Contiguous axial images were obtained from the base of the skull through the vertex without intravenous contrast. COMPARISON:  01/12/2020. Brain MR dated 09/04/2019. FINDINGS: Brain: Stable areas of low density in the right frontal and temporal lobes at the locations of the patient's known masses. Stable mild patchy white matter low density in both cerebral hemispheres. No intracranial hemorrhage or CT evidence of acute infarction. No mass effect. Vascular: No hyperdense vessel or unexpected calcification. Skull: Normal. Negative for fracture or focal lesion. Sinuses/Orbits: Unremarkable Other:  None. IMPRESSION: 1. No acute abnormality. 2. Stable areas of low density in the right frontal and temporal lobes at the locations of the patient's known masses compatible with metastases. 3. Stable mild chronic small vessel white matter ischemic changes in both cerebral hemispheres. Electronically Signed   By: Claudie Revering [name]   On: 01/16/2020 12:33   MR Brain W Wo Contrast  Result Date: 02/01/2020 CLINICAL DATA:  Follow-up breast cancer with brain metastases. Status post radiation therapy 11-10/2019. EXAM: MRI HEAD WITHOUT AND WITH CONTRAST TECHNIQUE: Multiplanar, multiecho pulse sequences of the brain and surrounding structures were obtained without and with intravenous contrast. CONTRAST:  7.8m GADAVIST GADOBUTROL 1 MMOL/ML IV SOLN COMPARISON:  Head CT 01/16/2020 and MRI 09/04/2019 FINDINGS: The study is mildly motion degraded. Brain: A 2.8 x 2.0 x 1.9 cm (AP x transverse  x craniocaudal) heterogeneously enhancing right frontal mass and a 4.9 x 2.4 x 2.0 cm right temporal mass have both decreased in size from the prior MRI with both appearing partially cystic. There is persistent mild edema surrounding the right temporal lesion, while right frontal lobe edema has resolved. No new enhancing lesion is identified. There is no acute infarct, midline shift, or extra-axial fluid collection. Small foci of T2 hyperintensity in the cerebral white matter bilaterally have mildly increased and are nonspecific but compatible with mild chronic small vessel ischemic disease. There is a mildly expanded partially empty sella. There is mild cerebral atrophy. Vascular: Major intracranial vascular flow voids are preserved. Skull and upper cervical spine: No destructive skull lesion. Diffusely diminished bone marrow T1 signal intensity in the included cervical spine, nonspecific but may be related to the patient's known anemia. Sinuses/Orbits: Unremarkable orbits. Clear paranasal sinuses. Small left mastoid effusion. Other: None. IMPRESSION: 1. Decreased size of right frontal and right temporal lobe metastases. Mild residual right temporal edema without significant mass effect. 2. No evidence of new intracranial metastases or acute abnormality. Electronically Signed   By: ALogan BoresM.D.   On: 02/01/2020 16:44   MR CERVICAL SPINE WO CONTRAST  Result Date: 02/12/2020 CLINICAL DATA:  S29old female with history of metastatic breast cancer, with new onset bilateral lower extremity paresis. Evaluate for metastatic spinal involvement. EXAM: MRI CERVICAL AND LUMBAR SPINE WITHOUT CONTRAST TECHNIQUE: Multiplanar and multiecho pulse sequences of the cervical spine, to include the craniocervical junction and cervicothoracic junction, and lumbar spine, were obtained without intravenous contrast. COMPARISON:  Prior PET-CT from 08/20/2019. FINDINGS: MRI CERVICAL SPINE FINDINGS Alignment: Examination  moderately degraded by motion artifact. Mild straightening of the normal cervical lordosis. No listhesis or subluxation. Vertebrae: Vertebral body height maintained without evidence for acute or chronic fracture. Bone marrow signal intensity diffusely heterogeneous but within normal limits. No worrisome osseous lesions or metastatic disease identified on this motion degraded noncontrast examination. No abnormal marrow edema. Cord: Signal intensity within the cervical spinal cord grossly within normal limits allowing for motion artifact. Posterior Fossa, vertebral arteries, paraspinal tissues: Left mastoid effusion partially visualized. Visualized brain and posterior fossa within normal limits. Craniocervical junction normal. Superficial skin lesion partially seen involving the right upper back (series 9, image 38), better seen on previous thoracic spine MRI. Paraspinous and prevertebral soft tissues otherwise within normal limits. Normal intravascular flow voids seen within the vertebral arteries bilaterally. Disc levels: C2-C3: Shallow central disc protrusion mildly indents the ventral thecal sac. Protruding disc contacts the ventral spinal cord without significant stenosis or cord deformity. Superimposed mild left facet degeneration. No significant foraminal narrowing. C3-C4: Shallow broad-based  central disc protrusion indents the ventral thecal sac, contacting the ventral spinal cord. Resultant mild spinal stenosis without definite cord deformity. Superimposed bilateral facet hypertrophy. Foramina remain widely patent. C4-C5: Mild disc bulge. Flattening and partial effacement of the ventral thecal sac without significant spinal stenosis or cord deformity. Foramina remain widely patent. C5-C6: Minimal disc bulge with endplate and uncovertebral spurring. No significant spinal stenosis. Foramina remain patent. C6-C7: Mild uncovertebral hypertrophy without disc bulge. Left-sided facet degeneration. No significant  spinal stenosis. Mild left C7 foraminal narrowing. No significant right foraminal stenosis. C7-T1: Negative interspace. Left greater than right facet hypertrophy. No spinal stenosis. Mild left C8 foraminal narrowing. Visualized upper thoracic spine within normal limits. MRI LUMBAR SPINE FINDINGS Segmentation: Examination mild to moderately degraded by motion artifact. Normal segmentation. Lowest well-formed disc space labeled the L5-S1 level. Alignment: Trace anterolisthesis of L4 on L5 and L5 on S1, chronic and facet mediated. Alignment otherwise normal with preservation of the normal lumbar lordosis. Vertebrae: Vertebral body height maintained without evidence for acute or chronic fracture. Bone marrow signal intensity within normal limits. No evidence for metastatic disease within the lumbar spine. No worrisome osseous lesions. Subcentimeter benign hemangioma noted within the L5 vertebral body. No abnormal marrow edema. Conus medullaris and cauda equina: Conus extends to the L1 level. Conus and cauda equina appear grossly normal allowing for motion artifact. Paraspinal and other soft tissues: Paraspinous soft tissues within normal limits. Partially visualized visceral structures are grossly unremarkable. Atherosclerotic change noted within the intra-abdominal aorta. No aneurysm. Disc levels: L1-2: Mild diffuse disc bulge with disc desiccation. Mild to moderate facet hypertrophy. Mild prominence of the dorsal epidural fat. No canal or foraminal stenosis. L2-3: Mild annular disc bulge with disc desiccation. Mild to moderate facet hypertrophy. Mild prominence of the dorsal epidural fat. No canal or foraminal stenosis. L3-4: Mild annular disc bulge with disc desiccation, slightly asymmetric to the right. Moderate facet and ligament flavum hypertrophy. Borderline mild spinal stenosis. Mild bilateral L3 foraminal narrowing. L4-5: Trace anterolisthesis. No significant disc bulge. Moderate bilateral facet hypertrophy,  slightly greater on the left. Prominence of the dorsal epidural fat. Borderline mild spinal stenosis. Mild right L4 foraminal stenosis. No significant left foraminal narrowing. L5-S1: Trace anterolisthesis. Disc desiccation. Shallow broad-based central disc protrusion closely approximates the descending S1 nerve roots without frank neural impingement or displacement. Moderate bilateral facet hypertrophy. Epidural lipomatosis. No significant canal or lateral recess stenosis. Mild bilateral L5 foraminal narrowing. IMPRESSION: MRI CERVICAL SPINE IMPRESSION: 1. No evidence for metastatic disease within the cervical spine on this noncontrast and motion degraded exam. 2. Indeterminate superficial skin lesion involving the right upper posterior thorax, partially visualized, and better seen on prior thoracic spine MRI from 02/11/2020. Finding also described on previous PET-CT from October of 2020. 3. Mild multilevel cervical spondylosis with resultant mild spinal stenosis at C3-4, with mild left C7 and C8 foraminal narrowing. MRI LUMBAR SPINE IMPRESSION: 1. No evidence for metastatic disease within the lumbar spine on this non-contrast and motion degraded exam. 2. Shallow central disc protrusion at L5-S1, closely approximating the descending S1 nerve roots without frank neural impingement 3. Mild to moderate facet hypertrophy throughout the lumbar spine, most pronounced at L4-5 and L5-S1. Finding could contribute to underlying lower back pain. Electronically Signed   By: Jeannine Boga [name]   On: 02/12/2020 23:05   MR THORACIC SPINE WO CONTRAST  Result Date: 02/11/2020 CLINICAL DATA:  64 year old female with metastatic breast cancer. Back pain. A total spine MRI without and with contrast  was planned, but the patient was only able to tolerate a noncontrast thoracic exam due to pain. EXAM: MRI THORACIC SPINE WITHOUT CONTRAST TECHNIQUE: Multiplanar, multisequence MR imaging of the thoracic spine was performed. No  intravenous contrast was administered. COMPARISON:  CT cervical spine 11/19/2019. 08/20/2019 PET-CT. 05/03/2018 chest CT. FINDINGS: Limited cervical spine imaging: Straightening of cervical lordosis appears stable since January. Grossly normal bone marrow signal in the visible cervical spine. Thoracic spine segmentation:  Appears normal. Alignment:  Preserved thoracic kyphosis. No spondylolisthesis. Vertebrae: Thoracic bone marrow signal is within normal limits. No marrow edema or evidence of acute osseous abnormality. No suspicious thoracic vertebral lesion identified. However, there are questionable posterior rib lesions, such as on the right at the T6 and T8 levels series 21, images 19 and 25. Cord: Capacious thoracic spinal canal. Thoracic spinal cord and conus medullaris appear within normal limits. The conus is at T12-L1. Paraspinal and other soft tissues: Chronic superficial skin and soft tissue nodularity to the right of midline from the T1 through T8 thoracic levels (series 21, image 13 and series 17, image 7). This appears grossly stable since 2019 and may be chronic scarring. However, there was described hypermetabolism here on a 08/20/2019 PET-CT Other thoracic paraspinal soft tissues are within normal limits. There is mildly heterogeneous signal in the visible left lung including a 12 mm nodular area on series 20, image 15. Trace right pleural effusion. Negative visible upper abdominal viscera. Disc levels: Mild for age thoracic spine degeneration with no spinal stenosis or neural foraminal stenosis. IMPRESSION: 1. No metastatic disease identified in the noncontrast thoracic spine. Mild for age thoracic spine degeneration with no spinal stenosis or neural impingement. 2. Questionable posterior rib lesions at the T6 and T8 levels. Consider follow-up Chest CT or Bone Scan. 3. Indeterminate left lung nodularity also, for which repeat chest CT would be most valuable. 4. Indeterminate superficial skin  lesions over the right posterior thorax, roughly stable since 2019 but also described by PET-CT in October. Electronically Signed   By: Genevie Ann [name]   On: 02/11/2020 21:59   MR LUMBAR SPINE WO CONTRAST  Result Date: 02/12/2020 CLINICAL DATA:  25 old female with history of metastatic breast cancer, with new onset bilateral lower extremity paresis. Evaluate for metastatic spinal involvement. EXAM: MRI CERVICAL AND LUMBAR SPINE WITHOUT CONTRAST TECHNIQUE: Multiplanar and multiecho pulse sequences of the cervical spine, to include the craniocervical junction and cervicothoracic junction, and lumbar spine, were obtained without intravenous contrast. COMPARISON:  Prior PET-CT from 08/20/2019. FINDINGS: MRI CERVICAL SPINE FINDINGS Alignment: Examination moderately degraded by motion artifact. Mild straightening of the normal cervical lordosis. No listhesis or subluxation. Vertebrae: Vertebral body height maintained without evidence for acute or chronic fracture. Bone marrow signal intensity diffusely heterogeneous but within normal limits. No worrisome osseous lesions or metastatic disease identified on this motion degraded noncontrast examination. No abnormal marrow edema. Cord: Signal intensity within the cervical spinal cord grossly within normal limits allowing for motion artifact. Posterior Fossa, vertebral arteries, paraspinal tissues: Left mastoid effusion partially visualized. Visualized brain and posterior fossa within normal limits. Craniocervical junction normal. Superficial skin lesion partially seen involving the right upper back (series 9, image 38), better seen on previous thoracic spine MRI. Paraspinous and prevertebral soft tissues otherwise within normal limits. Normal intravascular flow voids seen within the vertebral arteries bilaterally. Disc levels: C2-C3: Shallow central disc protrusion mildly indents the ventral thecal sac. Protruding disc contacts the ventral spinal cord without  significant stenosis or cord deformity.  Superimposed mild left facet degeneration. No significant foraminal narrowing. C3-C4: Shallow broad-based central disc protrusion indents the ventral thecal sac, contacting the ventral spinal cord. Resultant mild spinal stenosis without definite cord deformity. Superimposed bilateral facet hypertrophy. Foramina remain widely patent. C4-C5: Mild disc bulge. Flattening and partial effacement of the ventral thecal sac without significant spinal stenosis or cord deformity. Foramina remain widely patent. C5-C6: Minimal disc bulge with endplate and uncovertebral spurring. No significant spinal stenosis. Foramina remain patent. C6-C7: Mild uncovertebral hypertrophy without disc bulge. Left-sided facet degeneration. No significant spinal stenosis. Mild left C7 foraminal narrowing. No significant right foraminal stenosis. C7-T1: Negative interspace. Left greater than right facet hypertrophy. No spinal stenosis. Mild left C8 foraminal narrowing. Visualized upper thoracic spine within normal limits. MRI LUMBAR SPINE FINDINGS Segmentation: Examination mild to moderately degraded by motion artifact. Normal segmentation. Lowest well-formed disc space labeled the L5-S1 level. Alignment: Trace anterolisthesis of L4 on L5 and L5 on S1, chronic and facet mediated. Alignment otherwise normal with preservation of the normal lumbar lordosis. Vertebrae: Vertebral body height maintained without evidence for acute or chronic fracture. Bone marrow signal intensity within normal limits. No evidence for metastatic disease within the lumbar spine. No worrisome osseous lesions. Subcentimeter benign hemangioma noted within the L5 vertebral body. No abnormal marrow edema. Conus medullaris and cauda equina: Conus extends to the L1 level. Conus and cauda equina appear grossly normal allowing for motion artifact. Paraspinal and other soft tissues: Paraspinous soft tissues within normal limits. Partially  visualized visceral structures are grossly unremarkable. Atherosclerotic change noted within the intra-abdominal aorta. No aneurysm. Disc levels: L1-2: Mild diffuse disc bulge with disc desiccation. Mild to moderate facet hypertrophy. Mild prominence of the dorsal epidural fat. No canal or foraminal stenosis. L2-3: Mild annular disc bulge with disc desiccation. Mild to moderate facet hypertrophy. Mild prominence of the dorsal epidural fat. No canal or foraminal stenosis. L3-4: Mild annular disc bulge with disc desiccation, slightly asymmetric to the right. Moderate facet and ligament flavum hypertrophy. Borderline mild spinal stenosis. Mild bilateral L3 foraminal narrowing. L4-5: Trace anterolisthesis. No significant disc bulge. Moderate bilateral facet hypertrophy, slightly greater on the left. Prominence of the dorsal epidural fat. Borderline mild spinal stenosis. Mild right L4 foraminal stenosis. No significant left foraminal narrowing. L5-S1: Trace anterolisthesis. Disc desiccation. Shallow broad-based central disc protrusion closely approximates the descending S1 nerve roots without frank neural impingement or displacement. Moderate bilateral facet hypertrophy. Epidural lipomatosis. No significant canal or lateral recess stenosis. Mild bilateral L5 foraminal narrowing. IMPRESSION: MRI CERVICAL SPINE IMPRESSION: 1. No evidence for metastatic disease within the cervical spine on this noncontrast and motion degraded exam. 2. Indeterminate superficial skin lesion involving the right upper posterior thorax, partially visualized, and better seen on prior thoracic spine MRI from 02/11/2020. Finding also described on previous PET-CT from October of 2020. 3. Mild multilevel cervical spondylosis with resultant mild spinal stenosis at C3-4, with mild left C7 and C8 foraminal narrowing. MRI LUMBAR SPINE IMPRESSION: 1. No evidence for metastatic disease within the lumbar spine on this non-contrast and motion degraded exam.  2. Shallow central disc protrusion at L5-S1, closely approximating the descending S1 nerve roots without frank neural impingement 3. Mild to moderate facet hypertrophy throughout the lumbar spine, most pronounced at L4-5 and L5-S1. Finding could contribute to underlying lower back pain. Electronically Signed   By: Jeannine Boga [name]   On: 02/12/2020 23:05   DG Hand Complete Right  Result Date: 01/21/2020 CLINICAL DATA:  Swelling, history breast cancer EXAM:  RIGHT HAND - COMPLETE 3+ VIEW COMPARISON:  None FINDINGS: Osseous demineralization. Scattered mild degenerative changes greatest at first [facility] joint and IP joint thumb. Scattered soft tissue swelling. Fingers superimposed on lateral view limiting assessment. No acute fracture, dislocation or bone destruction. IMPRESSION: Scattered degenerative changes and soft tissue swelling. No acute osseous abnormalities. Electronically Signed   By: Lavonia Dana [name]   On: 01/21/2020 11:42   VAS [name] LOWER EXTREMITY VENOUS (DVT) ([facility] and [facility] 7a-7p)  Result Date: 01/21/2020  Lower Venous DVTStudy Indications: Pain, and Swelling.  Limitations: Poor ultrasound/tissue interface and patient positioning, patient movement. Comparison Study: 01/16/2020 - Negative for DVT. Performing Technologist: Oliver Hum RVT  Examination Guidelines: A complete evaluation includes B-mode imaging, spectral Doppler, color Doppler, and power Doppler as needed of all accessible portions of each vessel. Bilateral testing is considered an integral part of a complete examination. Limited examinations for reoccurring indications may be performed as noted. The reflux portion of the exam is performed with the patient in reverse Trendelenburg.  +---------+---------------+---------+-----------+----------+--------------+ RIGHT    CompressibilityPhasicitySpontaneityPropertiesThrombus Aging +---------+---------------+---------+-----------+----------+--------------+ CFV      Full            Yes      Yes                                 +---------+---------------+---------+-----------+----------+--------------+ SFJ      Full                                                        +---------+---------------+---------+-----------+----------+--------------+ FV Prox  Full                                                        +---------+---------------+---------+-----------+----------+--------------+ FV Mid   Full                                                        +---------+---------------+---------+-----------+----------+--------------+ FV DistalFull                                                        +---------+---------------+---------+-----------+----------+--------------+ PFV      Full                                                        +---------+---------------+---------+-----------+----------+--------------+ POP      Full           Yes      Yes                                 +---------+---------------+---------+-----------+----------+--------------+  PTV      Full                                                        +---------+---------------+---------+-----------+----------+--------------+ PERO     Full                                                        +---------+---------------+---------+-----------+----------+--------------+   +---------+---------------+---------+-----------+----------+--------------+ LEFT     CompressibilityPhasicitySpontaneityPropertiesThrombus Aging +---------+---------------+---------+-----------+----------+--------------+ CFV      Full           Yes      Yes                                 +---------+---------------+---------+-----------+----------+--------------+ SFJ      Full                                                        +---------+---------------+---------+-----------+----------+--------------+ FV Prox  Full                                                         +---------+---------------+---------+-----------+----------+--------------+ FV Mid   Full                                                        +---------+---------------+---------+-----------+----------+--------------+ FV DistalFull                                                        +---------+---------------+---------+-----------+----------+--------------+ PFV      Full                                                        +---------+---------------+---------+-----------+----------+--------------+ POP      Full           Yes      Yes                                 +---------+---------------+---------+-----------+----------+--------------+ PTV      Full                                                        +---------+---------------+---------+-----------+----------+--------------+  PERO     Full                                                        +---------+---------------+---------+-----------+----------+--------------+     Summary: RIGHT: - There is no evidence of deep vein thrombosis in the lower extremity.  - No cystic structure found in the popliteal fossa.  LEFT: - There is no evidence of deep vein thrombosis in the lower extremity.  - No cystic structure found in the popliteal fossa.  *See table(s) above for measurements and observations. Electronically signed by Harold Barban MD on 01/21/2020 at 9:44:28 PM.    Final    VAS [name] LOWER EXTREMITY VENOUS (DVT) (ONLY [facility] & [facility])  Result Date: 01/16/2020  Lower Venous DVTStudy Indications: Swelling.  Limitations: Body habitus and poor ultrasound/tissue interface. Comparison Study: No prior exam. Performing Technologist: Baldwin Crown ARDMS, RVT  Examination Guidelines: A complete evaluation includes B-mode imaging, spectral Doppler, color Doppler, and power Doppler as needed of all accessible portions of each vessel. Bilateral testing is considered an integral part of a complete examination. Limited  examinations for reoccurring indications may be performed as noted. The reflux portion of the exam is performed with the patient in reverse Trendelenburg.  +---------+---------------+---------+-----------+----------+------------------+ RIGHT    CompressibilityPhasicitySpontaneityPropertiesThrombus Aging     +---------+---------------+---------+-----------+----------+------------------+ CFV      Full           Yes      Yes                                     +---------+---------------+---------+-----------+----------+------------------+ SFJ      Full                                                            +---------+---------------+---------+-----------+----------+------------------+ FV Prox  Full                                                            +---------+---------------+---------+-----------+----------+------------------+ FV Mid   Full                                                            +---------+---------------+---------+-----------+----------+------------------+ FV DistalFull                                                            +---------+---------------+---------+-----------+----------+------------------+ PFV      Full                                                            +---------+---------------+---------+-----------+----------+------------------+  POP      Full           Yes      Yes                                     +---------+---------------+---------+-----------+----------+------------------+ PTV      Full                                         visualized with                                                          color              +---------+---------------+---------+-----------+----------+------------------+ PERO     Full                                         visualized with                                                          color               +---------+---------------+---------+-----------+----------+------------------+   +---------+---------------+---------+-----------+----------+------------------+ LEFT     CompressibilityPhasicitySpontaneityPropertiesThrombus Aging     +---------+---------------+---------+-----------+----------+------------------+ CFV      Full           Yes      Yes                                     +---------+---------------+---------+-----------+----------+------------------+ SFJ      Full                                                            +---------+---------------+---------+-----------+----------+------------------+ FV Prox  Full                                                            +---------+---------------+---------+-----------+----------+------------------+ FV Mid   Full                                                            +---------+---------------+---------+-----------+----------+------------------+ FV DistalFull  visualized with                                                          color              +---------+---------------+---------+-----------+----------+------------------+ PFV      Full                                                            +---------+---------------+---------+-----------+----------+------------------+ POP      Full           Yes      Yes                                     +---------+---------------+---------+-----------+----------+------------------+ PTV      Full                                         visualized with                                                          color              +---------+---------------+---------+-----------+----------+------------------+ PERO     Full                                         visualized with                                                          color               +---------+---------------+---------+-----------+----------+------------------+     Summary: BILATERAL: - No evidence of deep vein thrombosis seen in the lower extremities, bilaterally.  RIGHT: - Portions of this examination were limited- see technologist comments above. - No cystic structure found in the popliteal fossa.  LEFT: - Portions of this examination were limited- see technologist comments above. - No cystic structure found in the popliteal fossa.  *See table(s) above for measurements and observations. Electronically signed by Monica Martinez MD on 01/16/2020 at 4:22:13 PM.    Final        LAB RESULTS: Basic Metabolic Panel: Recent Labs  Lab 02/13/20 0708 02/13/20 0708 02/14/20 0500  NA 137  --  140  K 4.0  --  3.8  CL 100  --  99  CO2 25  --  25  GLUCOSE 83  --  90  BUN 13  --  11  CREATININE 0.89  --  0.89  CALCIUM 9.0  --  8.9  MG 1.7   < > 1.4*  PHOS 3.5   < > 3.7   < > = values in this interval not displayed.   Liver Function Tests: Recent Labs  Lab 02/13/20 0708 02/14/20 0500  AST 38 32  ALT 21 18  ALKPHOS 72 69  BILITOT 0.4 0.3  PROT 6.4* 6.3*  ALBUMIN 2.7* 2.7*   No results for input(s): LIPASE, AMYLASE in the last 168 hours. No results for input(s): AMMONIA in the last 168 hours. CBC: Recent Labs  Lab 02/13/20 0708 02/13/20 0708 02/14/20 0500  WBC 3.4*  --  3.1*  NEUTROABS 1.6*   < > 1.4*  HGB 9.7*  --  10.1*  HCT 31.4*  --  33.5*  MCV 92.1   < > 93.3  PLT 242  --  250   < > = values in this interval not displayed.   Cardiac Enzymes: No results for input(s): CKTOTAL, CKMB, CKMBINDEX, TROPONINI in the last 168 hours. BNP: Invalid input(s): POCBNP CBG: No results for input(s): GLUCAP in the last 168 hours.     Disposition and Follow-up: Discharge Instructions    Diet - low sodium heart healthy   Complete by: As directed    Increase activity slowly   Complete by: As directed        DISPOSITION: Home with home health  PT   [place], [facility] Follow up.   Specialty: [place] Why: [facility] physical therapy/occupational therapy Contact information: [address] [place] [address] [phone]        [name], [name], MD Follow up on 02/21/2020.   Specialty: Oncology Why: at 9:45am  Contact information: [place] [address] [phone]            Time coordinating discharge:  40 minutes  Signed:   Estill Cotta [name] [facility] 02/14/2020, 12:09 PM

## 2020-02-14 NOTE — TOC Transition Note (Signed)
Transition of Care [facility]) - CM/SW Discharge Note   Patient Details  Name: [name] MRN: [id number] Date of Birth: 02-Jun-1956  [facility] of [facility] [place]) CM/SW Contact:  [name], RN Phone Number: 02/14/2020, 10:04 AM   Clinical Narrative: d/c home w/[facility] rep Tommi Rumps aware of HHPT/OT orders. Per dtr [name]-Good family support. Has cane, rw. Family will transport home on own. No further CM needs.      Final next level of care: [place] Barriers to Discharge: No Barriers Identified   Patient Goals and CMS Choice Patient states their goals for this hospitalization and ongoing recovery are:: go home CMS Medicare.gov Compare Post Acute Care list provided to:: Patient Represenative (must comment)([name]) Choice offered to / list presented to : Adult Children  Discharge Placement                       Discharge Plan and Services   Discharge Planning Services: CM Consult                      HH Arranged: PT, OT HH Agency: [place] Date [facility] Agency Contacted: 02/13/20 Time [place]: [place] Representative spoke with at [place]: [place] Determinants of Health ([place]) Interventions     Readmission Risk Interventions No flowsheet data found.

## 2020-02-16 LAB — CULTURE, BLOOD (ROUTINE X 2)
Culture: NO GROWTH
Culture: NO GROWTH
Special Requests: ADEQUATE

## 2020-02-17 NOTE — Progress Notes (Signed)
Pharmacist Chemotherapy Monitoring - Follow Up Assessment    I verify that I have reviewed each item in the below checklist:  . Regimen for the patient is scheduled for the appropriate day and plan matches scheduled date. Marland Kitchen Appropriate non-routine labs are ordered dependent on drug ordered. . If applicable, additional medications reviewed and ordered per protocol based on lifetime cumulative doses and/or treatment regimen.   Plan for follow-up and/or issues identified: No . I-vent associated with next due treatment: No . MD and/or nursing notified: No  Philomena Course 02/17/2020 3:46 PM

## 2020-02-19 ENCOUNTER — Other Ambulatory Visit: Payer: Self-pay

## 2020-02-19 ENCOUNTER — Other Ambulatory Visit: Payer: Medicare Other | Admitting: Hospice

## 2020-02-19 DIAGNOSIS — Z515 Encounter for palliative care: Secondary | ICD-10-CM

## 2020-02-19 DIAGNOSIS — C50211 Malignant neoplasm of upper-inner quadrant of right female breast: Secondary | ICD-10-CM

## 2020-02-19 DIAGNOSIS — Z170 Estrogen receptor positive status [ER+]: Secondary | ICD-10-CM

## 2020-02-19 NOTE — Progress Notes (Signed)
[name] Consult Note Telephone: [phone]  Fax: [phone]  PATIENT NAME: [name] DOB: 1955/11/30 MRN: [id number]  PRIMARY CARE PROVIDER:   Chauncey Cruel, MD  REFERRING PROVIDER:  Jana Hakim [name], MD [place],  [place] [id number]  REFERRING PROVIDER:Dr Rosedale PARTY:Self Contacts:  [name] [place] [phone]  TELEHEALTH VISIT STATEMENT Due to the COVID-19 crisis, this visit was done via telephone from my office. It was initiated and consented to by this patient and/or family.  RECOMMENDATIONS/PLAN:  Advance Care Planning/Goals of Care: Telehealth visit for follow up on palliative care Patient is a DNR. Daughters are now actively involved in her care, especially in overseeing her medications.  MOST form to be signed during scheduled in person visit 03/26/2020. Symptom management: Edema in BLE; this was resolved patient was briefly on Furosemide but has come back after stopping. Daughter- [name] said she would call PCP to confirm if patient can resume it. 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 Naloxone at home. She goes to a pain clinic. Her daughters have taken over management of her medications because of patient's confusion since her CA mets to her brain. Confusion improved since she completed Cephalexin recently for UTI. She continues on wearing her briefs for incontinence. 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 reatment on 02/12/2020. Nursing aid from [facility] comes in 2 hours a day, 7 days a week. Patient now uses a rolling walker/cane for support r/t unsteady gait. Education provided on Falls precautions. Encouraged ongoing care and to call with  concerns.  Follow [id number] care will continue to follow patient for goals of care clarification and symptom management. I spent50 minutes providing this consultation;  time includes chart review and documentation. More than 50% of the time in this consultation was spent on coordinating communication  HISTORY OF PRESENT ILLNESS:[name] Foxis a 64 y.o.year oldfemalewith multiple medical problems includingStage IV estrogen receptor negative Breast Cancer, with brain mets, angiosarcoma, Gout.Palliative Care was asked to help address goals of care.     CODE STATUS: DNR   PPS: 50% HOSPICE ELIGIBILITY/DIAGNOSIS: TBD  PAST MEDICAL HISTORY:  Past Medical History:  Diagnosis Date  . Breast cancer ([place])   . DVT (deep venous thrombosis) ([place]) 10/07/2011  . Fever 02/06/2018  . Hypertension   . Radiation 11/29/2005-12/29/2005   right supraclavicular area 4147 cGy  . Radiation 06/26/02-08/14/02   right breast 5040 cGy, tumor bed boosted to 1260 cGy  . Rheumatic fever     SOCIAL HX:  Social History   Tobacco Use  . Smoking status: Former Research scientist (life sciences)  . Smokeless tobacco: Never Used  Substance Use Topics  . Alcohol use: Yes    Comment: Rarely    ALLERGIES:  Allergies  Allergen Reactions  . Tramadol Nausea Only  . Hydrocodone-Acetaminophen Itching and Rash  . Pork-Derived Products Other (See Comments)    Pt says she just doesn't eat pork; has no reaction to pork products     PERTINENT MEDICATIONS:  Outpatient Encounter Medications as of 02/19/2020  Medication Sig  . allopurinol (ZYLOPRIM) 300 MG tablet Take 300 mg by mouth daily.  Marland Kitchen ALPRAZolam (XANAX) 1 MG tablet Take 1 tablet (1 mg total) by mouth at bedtime as needed for sleep.  . Ascorbic Acid (VITAMIN C PO) Take 1 tablet by mouth  daily.  . cholecalciferol (VITAMIN D) 1000 units tablet Take 1 tablet (1,000 Units total) by mouth daily. (Patient not taking: Reported on 11/19/2019)  . cholecalciferol (VITAMIN D3) 25 MCG (1000  UNIT) tablet Take 1,000 Units by mouth daily.  Marland Kitchen gabapentin (NEURONTIN) 100 MG capsule Take 1 capsule (300 mg total) by mouth 2 (two) times daily as needed.  . magic mouthwash SOLN Take 10 mLs by mouth 4 (four) times daily as needed for mouth pain. Swish and spit.   . magnesium oxide (MAG-OX) 400 (241.3 Mg) MG tablet Take 1 tablet (400 mg total) by mouth 2 (two) times daily for 5 days.  . methadone (DOLOPHINE) 10 MG tablet Take 1 tablet (10 mg total) by mouth daily. Taking once a day  . Multiple Vitamin (MULTIVITAMIN) capsule Take 1 capsule by mouth daily.    . naloxone (NARCAN) nasal spray 4 mg/0.1 mL Administer single spray of naloxone into one nostril. Additionally, spray can be given every 2-3 minutes as needed until emergency medical assistance arrives. Call 911 immediately after use. (Patient not taking: Reported on 01/15/2020)  . omeprazole (PRILOSEC) 40 MG capsule Take 1 capsule (40 mg total) by mouth daily.  . ondansetron (ZOFRAN) 4 MG tablet TAKE 1 TABLET BY MOUTH THREE TIMES A DAY AS NEEDED (Patient taking differently: Take 4 mg by mouth every 8 (eight) hours as needed for nausea or vomiting. )  . Oxycodone HCl 20 MG TABS Take 0.5 tablets (10 mg total) by mouth 3 (three) times daily as needed (pain).  . polyethylene glycol (MIRALAX / GLYCOLAX) 17 g packet Take 17 g by mouth daily as needed for mild constipation. (Patient not taking: Reported on 01/15/2020)  . prochlorperazine (COMPAZINE) 10 MG tablet Take 1 tablet (10 mg total) by mouth every 6 (six) hours as needed for nausea or vomiting.  . sertraline (ZOLOFT) 100 MG tablet TAKE 1 TABLET BY MOUTH EVERY DAY  . tucatinib (TUKYSA) 150 MG tablet Take 1 tablet (150 mg total) by mouth every morning. Take as directed by MD.   Facility-Administered Encounter Medications as of 02/19/2020  Medication  . sodium chloride 0.9 % injection 10 mL  . sodium chloride flush (NS) 0.9 % injection 10 mL      Teodoro Spray, NP

## 2020-02-20 NOTE — Progress Notes (Signed)
[facility]  [phone] [phone]    ID: [name]   DOB: [birth year]  MR#: [id number]  RNH#:[id number]  Patient Care Team: Chauncey Cruel, MD as PCP - General (Oncology) Lane Kjos, [name], MD as Consulting Physician (Oncology) [name], MD as Consulting Physician (Radiation Oncology) [name], [name], MD as Consulting Physician (Cardiology) [name], [name], MD as Consulting Physician (Psychiatry) [name], MD as Consulting Physician (Anesthesiology) [name], MD as Consulting Physician (Internal Medicine) [name], [name] 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  She is here by herself today.  She continues on Abraxane/trastuzumab/Pertuzumab.  She is due for dose today.  Her most recent treatment was 01/15/2020.  She is being treated every 4 weeks, and was due for treatment 02/11/2020, but she presented to the ED on 02/10/2020 after experiencing a fall. Head CT performed at that time showed no acute intracranial hemorrhage; similar appearance of metastatic disease.  She underwent thoracic spine MRI on 02/11/2020 showing: no metastatic disease; questionable posterior rib lesions at T6 and T8; indeterminate left lung nodularity; roughly stable superficial skin lesions over right posterior thorax. Cervical and lumbar spine MRI performed the following day showed: no evidence of metastatic disease; mild to moderate facet hypertrophy throughout lumbar spine, possibly contributing to underlying low back pain.  The patient had a Proteus urinary infection which was felt to account partly for her  encephalopathy and fall.    Her most recent echocardiogram on 12/25/2019 showed an ejection fraction of 60-65%.  REVIEW OF SYSTEMS: [name] has had some trouble with medication and we have asked her daughter [name] who is a healthcare power of attorney to basically take charge of that area.  [name] fills the patient's weekly pillbox.  [name] cannot tell me whether or not she has started her tucatinib.  She does have it on hand.  Today she does not complain of significant pain, but tells me she has some urinary leakage and is depressed.  She says at home she just watches TV, chats by phone with relatives, and her daughter who lives with her does the housework.  Bowel movements are "good", currently she has no headaches changes in her vision nausea vomiting or further falls.   BREAST CANCER HISTORY: From the original intake note:  The patient originally presented in May 2003 when she noticed a lump in the upper inner quadrant of her right breast in September 2003.  She sought attention and had a mammogram which showed an obvious carcinoma in the upper inner quadrant of the right breast, approximately 2 cm.  There were some enlarged lymph nodes in the axilla and an FNA done showed those consistent with malignant cells, most likely an invasive ductal carcinoma.  At that point she was unsure of what to do and was referred by Dr. [name] for a discussion of her treatment options.  By biopsy, it was ER/PR negative and HER-2 was 3+.  DNA index was 1.42.  We reiterated that it was most important to have her disease surgically addressed at that point and had recommended lumpectomy and axillary nodes to be addressed with which she followed through and on 04-03-02, Dr. [name] performed a right partial mastectomy and  a right axillary lymph node dissection.  Final pathology revealed a 2.4 cm. high grade, Grade III invasive ductal carcinoma with an adjacent .8 cm. also high grade invasive ductal carcinoma which was  felt to represent an intramammary metastasis rather than a second primary.  A smaller mass was just medial to the larger mass.  The smaller mass was associated with high grade DCIS component.  There was no definite lymphovascular invasion identified.  However, one of fourteen axillary lymph nodes did contain a 1.5 cm. metastatic deposit.  Postoperatively she did very well.  We reiterated the fact that she did need adjuvant chemotherapy, however, she refused and decided that she would pursue radiation.  She received radiation and completed that on 08-14-02. Her subsequent history is as detailed below.   PAST MEDICAL HISTORY: Past Medical History:  Diagnosis Date  . Breast cancer ([place])   . DVT (deep venous thrombosis) ([place]) 10/07/2011  . Fever 02/06/2018  . Hypertension   . Radiation 11/29/2005-12/29/2005   right supraclavicular area 4147 cGy  . Radiation 06/26/02-08/14/02   right breast 5040 cGy, tumor bed boosted to 1260 cGy  . Rheumatic fever     PAST SURGICAL HISTORY: Past Surgical History:  Procedure Laterality Date  . BREAST SURGERY    . MASTECTOMY Bilateral   . PORT A CATH REVISION      FAMILY HISTORY Family History  Problem Relation Age of Onset  . Pancreatic cancer Mother   . Kidney cancer Sister 37  . Breast cancer Sister 24  . Breast cancer Other 90  The patient's father died in his 71G  from complications of alcohol abuse. The patient's mother died in her 78s from pancreatic cancer. The patient has 6 brothers, 2 sisters. Both her sisters have had breast cancer, both diagnosed after the age of 84. The patient has not had genetic testing so far.   GYNECOLOGIC HISTORY: Menarche age 30; first live birth age 60. She is GXP4. She is not sure when she stopped having periods. She never used hormone replacement.   SOCIAL HISTORY: The patient has worked in the past as a Psychologist, counselling. At home in addition to the patient are her husband [name], originally from [place], who  works as a Administrator.  The other children are [name], who lives with the patient and has a college degree in psychology; she works as a Probation officer; no longer living in the home is her daughter [name] (she is studying to be a Marine scientist) and grandson.son [name], lives in [place] and works as a Higher education careers adviser. [name] also sometimes stays in the home. In addition the patient has an aide who helps her almost daily.    ADVANCED DIRECTIVES: The patient has completed advanced directives and name her daughter [name] as her healthcare power of attorney.  The patient also completed a living will.  This is notarized.  These documents are being sent for scanning on epic on 01/15/2020  HEALTH MAINTENANCE: Social History   Tobacco Use  . Smoking status: Former Research scientist (life sciences)  . Smokeless tobacco: Never Used  Substance Use Topics  . Alcohol use: Yes    Comment: Rarely  . Drug use: No     Colonoscopy:  Not on file  PAP:  Not on file  Bone density:  Not on file  Lipid panel:  Not on file   Allergies  Allergen Reactions  . Tramadol Nausea Only  . Hydrocodone-Acetaminophen Itching and Rash  . Pork-Derived Products Other (See Comments)  Pt says she just doesn't eat pork; has no reaction to pork products    Current Outpatient Medications  Medication Sig Dispense Refill  . allopurinol (ZYLOPRIM) 300 MG tablet Take 300 mg by mouth daily.    Marland Kitchen ALPRAZolam (XANAX) 1 MG tablet Take 1 tablet (1 mg total) by mouth at bedtime as needed for sleep. 15 tablet 0  . Ascorbic Acid (VITAMIN C PO) Take 1 tablet by mouth daily.    . cholecalciferol (VITAMIN D) 1000 units tablet Take 1 tablet (1,000 Units total) by mouth daily. (Patient not taking: Reported on 11/19/2019) 90 tablet 4  . cholecalciferol (VITAMIN D3) 25 MCG (1000 UNIT) tablet Take 1,000 Units by mouth daily.    Marland Kitchen gabapentin (NEURONTIN) 100 MG capsule Take 1 capsule (300 mg total) by mouth 2 (two) times daily as needed. 60 capsule 3  .  magic mouthwash SOLN Take 10 mLs by mouth 4 (four) times daily as needed for mouth pain. Swish and spit.     . methadone (DOLOPHINE) 10 MG tablet Take 1 tablet (10 mg total) by mouth daily. Taking once a day 30 tablet 0  . Multiple Vitamin (MULTIVITAMIN) capsule Take 1 capsule by mouth daily.      . naloxone (NARCAN) nasal spray 4 mg/0.1 mL Administer single spray of naloxone into one nostril. Additionally, spray can be given every 2-3 minutes as needed until emergency medical assistance arrives. Call 911 immediately after use. (Patient not taking: Reported on 01/15/2020) 1 each 2  . omeprazole (PRILOSEC) 40 MG capsule Take 1 capsule (40 mg total) by mouth daily. 30 capsule 1  . ondansetron (ZOFRAN) 4 MG tablet TAKE 1 TABLET BY MOUTH THREE TIMES A DAY AS NEEDED (Patient taking differently: Take 4 mg by mouth every 8 (eight) hours as needed for nausea or vomiting. ) 20 tablet 3  . Oxycodone HCl 20 MG TABS Take 0.5 tablets (10 mg total) by mouth 3 (three) times daily as needed (pain). 12 tablet 0  . polyethylene glycol (MIRALAX / GLYCOLAX) 17 g packet Take 17 g by mouth daily as needed for mild constipation. (Patient not taking: Reported on 01/15/2020) 14 each 0  . prochlorperazine (COMPAZINE) 10 MG tablet Take 1 tablet (10 mg total) by mouth every 6 (six) hours as needed for nausea or vomiting. 30 tablet 1  . sertraline (ZOLOFT) 100 MG tablet TAKE 1 TABLET BY MOUTH EVERY DAY 90 tablet 2  . torsemide (DEMADEX) 10 MG tablet Take 1 tablet (10 mg total) by mouth daily. 10 tablet 1  . tucatinib (TUKYSA) 150 MG tablet Take 1 tablet (150 mg total) by mouth every morning. Take as directed by MD. 60 tablet 3   No current facility-administered medications for this visit.   Facility-Administered Medications Ordered in Other Visits  Medication Dose Route Frequency Provider Last Rate Last Admin  . sodium chloride 0.9 % injection 10 mL  10 mL Intravenous PRN Celenia Hruska, [name], MD   10 mL at 03/04/14 1421  . sodium  chloride flush (NS) 0.9 % injection 10 mL  10 mL Intracatheter PRN Brittinie Wherley, [name], MD   10 mL at 07/30/19 1627    OBJECTIVE: Middle-aged African-American woman examined in a wheelchair  Vitals:   02/21/20 1010  BP: (!) 131/113  Pulse: 90  Resp: 17  Temp: 98.9 F (37.2 C)  SpO2: 95%   Wt Readings from Last 3 Encounters:  02/21/20 204 lb 11.2 oz (92.9 kg)  02/14/20 200 lb 2.8 oz (90.8 kg)  02/03/20 197 lb 11.2 oz (89.7 kg)   Body mass index is 32.06 kg/m.    Sclerae unicteric, EOMs intact Wearing a mask Lungs no rales or rhonchi Heart regular rate and rhythm Abd soft, nontender, positive bowel sounds MSK chronic right upper extremity lymphedema, no erythema Neuro: nonfocal, well oriented, agitated affect Breasts: Deferred   Photo 07/04/2017     Photo 08/23/2018    LAB RESULTS:.  Lab Results  Component Value Date   WBC 7.8 02/21/2020   NEUTROABS 5.3 02/21/2020   HGB 10.0 (L) 02/21/2020   HCT 33.2 (L) 02/21/2020   MCV 92.2 02/21/2020   PLT 282 02/21/2020      Chemistry      Component Value Date/Time   NA 143 02/21/2020 0915   NA 142 11/09/2017 1214   K 3.4 (L) 02/21/2020 0915   K 3.9 11/09/2017 1214   CL 106 02/21/2020 0915   CL 101 03/07/2013 1411   CO2 24 02/21/2020 0915   CO2 32 (H) 11/09/2017 1214   BUN 13 02/21/2020 0915   BUN 19.1 11/09/2017 1214   CREATININE 1.57 (H) 02/21/2020 0915   CREATININE 1.0 11/09/2017 1214      Component Value Date/Time   CALCIUM 9.0 02/21/2020 0915   CALCIUM 9.1 11/09/2017 1214   ALKPHOS 91 02/21/2020 0915   ALKPHOS 120 11/09/2017 1214   AST 23 02/21/2020 0915   AST 30 11/09/2017 1214   ALT 13 02/21/2020 0915   ALT 19 11/09/2017 1214   BILITOT 0.2 (L) 02/21/2020 0915   BILITOT <0.22 11/09/2017 1214       STUDIES: DG Chest 2 View  Result Date: 02/10/2020 CLINICAL DATA:  Altered mental status. EXAM: CHEST - 2 VIEW COMPARISON:  January 25, 2020 FINDINGS: There is a well-positioned left-sided  Port-A-Cath. The heart size is stable. Areas of scarring and atelectasis are noted bilaterally. There is no pneumothorax. There are degenerative changes throughout the visualized thoracic spine. IMPRESSION: No active cardiopulmonary disease. Electronically Signed   By: Constance Holster [name]   On: 02/10/2020 23:19   DG Chest 2 View  Result Date: 01/25/2020 CLINICAL DATA:  Cough and shortness of breath. History of breast cancer. Patient is unsteady on her feet. Patient has difficulty holding still. EXAM: CHEST - 2 VIEW COMPARISON:  01/21/2020 FINDINGS: Patient's LEFT-sided power port, tip to the superior vena cava. Heart is UPPER limits normal. There are patchy infiltrates in the lungs persist, but there has been some improvement in aeration accounting for differences in technique. No pleural effusions. RIGHT axillary node dissection. Patient motion artifact. IMPRESSION: Persistent but improved bilateral infiltrates. Electronically Signed   By: Nolon Nations [name]   On: 01/25/2020 16:54   CT Head Wo Contrast  Result Date: 02/10/2020 CLINICAL DATA:  64 year old female with headache. History of metastatic breast cancer. EXAM: CT HEAD WITHOUT CONTRAST TECHNIQUE: Contiguous axial images were obtained from the base of the skull through the vertex without intravenous contrast. COMPARISON:  Head CT dated 01/16/2020. FINDINGS: Brain: The ventricles and sulci appropriate size for patient's age. Areas of white matter hypodensity involving the inferior right frontal and right temporal lobes similar to prior CT in keeping with metastatic disease. There is no acute intracranial hemorrhage. No mass effect or midline shift. No extra-axial fluid collection. Vascular: No hyperdense vessel or unexpected calcification. Skull: Normal. Negative for fracture or focal lesion. Sinuses/Orbits: The visualized paranasal sinuses and the right mastoid air cells are clear. Mild left mastoid effusions. No air-fluid level. Other: None  IMPRESSION: 1. No acute intracranial hemorrhage. 2. Similar appearance of right frontal and temporal white matter hypodensity in keeping with metastatic disease. Electronically Signed   By: Anner Crete [name]   On: 02/10/2020 23:10   MR Brain W Wo Contrast  Result Date: 02/01/2020 CLINICAL DATA:  Follow-up breast cancer with brain metastases. Status post radiation therapy 11-10/2019. EXAM: MRI HEAD WITHOUT AND WITH CONTRAST TECHNIQUE: Multiplanar, multiecho pulse sequences of the brain and surrounding structures were obtained without and with intravenous contrast. CONTRAST:  7.54m GADAVIST GADOBUTROL 1 MMOL/ML IV SOLN COMPARISON:  Head CT 01/16/2020 and MRI 09/04/2019 FINDINGS: The study is mildly motion degraded. Brain: A 2.8 x 2.0 x 1.9 cm (AP x transverse x craniocaudal) heterogeneously enhancing right frontal mass and a 4.9 x 2.4 x 2.0 cm right temporal mass have both decreased in size from the prior MRI with both appearing partially cystic. There is persistent mild edema surrounding the right temporal lesion, while right frontal lobe edema has resolved. No new enhancing lesion is identified. There is no acute infarct, midline shift, or extra-axial fluid collection. Small foci of T2 hyperintensity in the cerebral white matter bilaterally have mildly increased and are nonspecific but compatible with mild chronic small vessel ischemic disease. There is a mildly expanded partially empty sella. There is mild cerebral atrophy. Vascular: Major intracranial vascular flow voids are preserved. Skull and upper cervical spine: No destructive skull lesion. Diffusely diminished bone marrow T1 signal intensity in the included cervical spine, nonspecific but may be related to the patient's known anemia. Sinuses/Orbits: Unremarkable orbits. Clear paranasal sinuses. Small left mastoid effusion. Other: None. IMPRESSION: 1. Decreased size of right frontal and right temporal lobe metastases. Mild residual right temporal  edema without significant mass effect. 2. No evidence of new intracranial metastases or acute abnormality. Electronically Signed   By: ALogan BoresM.D.   On: 02/01/2020 16:44   MR CERVICAL SPINE WO CONTRAST  Result Date: 02/12/2020 CLINICAL DATA:  S44old female with history of metastatic breast cancer, with new onset bilateral lower extremity paresis. Evaluate for metastatic spinal involvement. EXAM: MRI CERVICAL AND LUMBAR SPINE WITHOUT CONTRAST TECHNIQUE: Multiplanar and multiecho pulse sequences of the cervical spine, to include the craniocervical junction and cervicothoracic junction, and lumbar spine, were obtained without intravenous contrast. COMPARISON:  Prior PET-CT from 08/20/2019. FINDINGS: MRI CERVICAL SPINE FINDINGS Alignment: Examination moderately degraded by motion artifact. Mild straightening of the normal cervical lordosis. No listhesis or subluxation. Vertebrae: Vertebral body height maintained without evidence for acute or chronic fracture. Bone marrow signal intensity diffusely heterogeneous but within normal limits. No worrisome osseous lesions or metastatic disease identified on this motion degraded noncontrast examination. No abnormal marrow edema. Cord: Signal intensity within the cervical spinal cord grossly within normal limits allowing for motion artifact. Posterior Fossa, vertebral arteries, paraspinal tissues: Left mastoid effusion partially visualized. Visualized brain and posterior fossa within normal limits. Craniocervical junction normal. Superficial skin lesion partially seen involving the right upper back (series 9, image 38), better seen on previous thoracic spine MRI. Paraspinous and prevertebral soft tissues otherwise within normal limits. Normal intravascular flow voids seen within the vertebral arteries bilaterally. Disc levels: C2-C3: Shallow central disc protrusion mildly indents the ventral thecal sac. Protruding disc contacts the ventral spinal cord without  significant stenosis or cord deformity. Superimposed mild left facet degeneration. No significant foraminal narrowing. C3-C4: Shallow broad-based central disc protrusion indents the ventral thecal sac, contacting the ventral spinal cord. Resultant mild spinal stenosis without definite cord deformity. Superimposed  bilateral facet hypertrophy. Foramina remain widely patent. C4-C5: Mild disc bulge. Flattening and partial effacement of the ventral thecal sac without significant spinal stenosis or cord deformity. Foramina remain widely patent. C5-C6: Minimal disc bulge with endplate and uncovertebral spurring. No significant spinal stenosis. Foramina remain patent. C6-C7: Mild uncovertebral hypertrophy without disc bulge. Left-sided facet degeneration. No significant spinal stenosis. Mild left C7 foraminal narrowing. No significant right foraminal stenosis. C7-T1: Negative interspace. Left greater than right facet hypertrophy. No spinal stenosis. Mild left C8 foraminal narrowing. Visualized upper thoracic spine within normal limits. MRI LUMBAR SPINE FINDINGS Segmentation: Examination mild to moderately degraded by motion artifact. Normal segmentation. Lowest well-formed disc space labeled the L5-S1 level. Alignment: Trace anterolisthesis of L4 on L5 and L5 on S1, chronic and facet mediated. Alignment otherwise normal with preservation of the normal lumbar lordosis. Vertebrae: Vertebral body height maintained without evidence for acute or chronic fracture. Bone marrow signal intensity within normal limits. No evidence for metastatic disease within the lumbar spine. No worrisome osseous lesions. Subcentimeter benign hemangioma noted within the L5 vertebral body. No abnormal marrow edema. Conus medullaris and cauda equina: Conus extends to the L1 level. Conus and cauda equina appear grossly normal allowing for motion artifact. Paraspinal and other soft tissues: Paraspinous soft tissues within normal limits. Partially  visualized visceral structures are grossly unremarkable. Atherosclerotic change noted within the intra-abdominal aorta. No aneurysm. Disc levels: L1-2: Mild diffuse disc bulge with disc desiccation. Mild to moderate facet hypertrophy. Mild prominence of the dorsal epidural fat. No canal or foraminal stenosis. L2-3: Mild annular disc bulge with disc desiccation. Mild to moderate facet hypertrophy. Mild prominence of the dorsal epidural fat. No canal or foraminal stenosis. L3-4: Mild annular disc bulge with disc desiccation, slightly asymmetric to the right. Moderate facet and ligament flavum hypertrophy. Borderline mild spinal stenosis. Mild bilateral L3 foraminal narrowing. L4-5: Trace anterolisthesis. No significant disc bulge. Moderate bilateral facet hypertrophy, slightly greater on the left. Prominence of the dorsal epidural fat. Borderline mild spinal stenosis. Mild right L4 foraminal stenosis. No significant left foraminal narrowing. L5-S1: Trace anterolisthesis. Disc desiccation. Shallow broad-based central disc protrusion closely approximates the descending S1 nerve roots without frank neural impingement or displacement. Moderate bilateral facet hypertrophy. Epidural lipomatosis. No significant canal or lateral recess stenosis. Mild bilateral L5 foraminal narrowing. IMPRESSION: MRI CERVICAL SPINE IMPRESSION: 1. No evidence for metastatic disease within the cervical spine on this noncontrast and motion degraded exam. 2. Indeterminate superficial skin lesion involving the right upper posterior thorax, partially visualized, and better seen on prior thoracic spine MRI from 02/11/2020. Finding also described on previous PET-CT from October of 2020. 3. Mild multilevel cervical spondylosis with resultant mild spinal stenosis at C3-4, with mild left C7 and C8 foraminal narrowing. MRI LUMBAR SPINE IMPRESSION: 1. No evidence for metastatic disease within the lumbar spine on this non-contrast and motion degraded exam.  2. Shallow central disc protrusion at L5-S1, closely approximating the descending S1 nerve roots without frank neural impingement 3. Mild to moderate facet hypertrophy throughout the lumbar spine, most pronounced at L4-5 and L5-S1. Finding could contribute to underlying lower back pain. Electronically Signed   By: Jeannine Boga [name]   On: 02/12/2020 23:05   MR THORACIC SPINE WO CONTRAST  Result Date: 02/11/2020 CLINICAL DATA:  64 year old female with metastatic breast cancer. Back pain. A total spine MRI without and with contrast was planned, but the patient was only able to tolerate a noncontrast thoracic exam due to pain. EXAM: MRI THORACIC SPINE WITHOUT  CONTRAST TECHNIQUE: Multiplanar, multisequence MR imaging of the thoracic spine was performed. No intravenous contrast was administered. COMPARISON:  CT cervical spine 11/19/2019. 08/20/2019 PET-CT. 05/03/2018 chest CT. FINDINGS: Limited cervical spine imaging: Straightening of cervical lordosis appears stable since January. Grossly normal bone marrow signal in the visible cervical spine. Thoracic spine segmentation:  Appears normal. Alignment:  Preserved thoracic kyphosis. No spondylolisthesis. Vertebrae: Thoracic bone marrow signal is within normal limits. No marrow edema or evidence of acute osseous abnormality. No suspicious thoracic vertebral lesion identified. However, there are questionable posterior rib lesions, such as on the right at the T6 and T8 levels series 21, images 19 and 25. Cord: Capacious thoracic spinal canal. Thoracic spinal cord and conus medullaris appear within normal limits. The conus is at T12-L1. Paraspinal and other soft tissues: Chronic superficial skin and soft tissue nodularity to the right of midline from the T1 through T8 thoracic levels (series 21, image 13 and series 17, image 7). This appears grossly stable since 2019 and may be chronic scarring. However, there was described hypermetabolism here on a 08/20/2019 PET-CT  Other thoracic paraspinal soft tissues are within normal limits. There is mildly heterogeneous signal in the visible left lung including a 12 mm nodular area on series 20, image 15. Trace right pleural effusion. Negative visible upper abdominal viscera. Disc levels: Mild for age thoracic spine degeneration with no spinal stenosis or neural foraminal stenosis. IMPRESSION: 1. No metastatic disease identified in the noncontrast thoracic spine. Mild for age thoracic spine degeneration with no spinal stenosis or neural impingement. 2. Questionable posterior rib lesions at the T6 and T8 levels. Consider follow-up Chest CT or Bone Scan. 3. Indeterminate left lung nodularity also, for which repeat chest CT would be most valuable. 4. Indeterminate superficial skin lesions over the right posterior thorax, roughly stable since 2019 but also described by PET-CT in October. Electronically Signed   By: Genevie Ann [name]   On: 02/11/2020 21:59   MR LUMBAR SPINE WO CONTRAST  Result Date: 02/12/2020 CLINICAL DATA:  41 old female with history of metastatic breast cancer, with new onset bilateral lower extremity paresis. Evaluate for metastatic spinal involvement. EXAM: MRI CERVICAL AND LUMBAR SPINE WITHOUT CONTRAST TECHNIQUE: Multiplanar and multiecho pulse sequences of the cervical spine, to include the craniocervical junction and cervicothoracic junction, and lumbar spine, were obtained without intravenous contrast. COMPARISON:  Prior PET-CT from 08/20/2019. FINDINGS: MRI CERVICAL SPINE FINDINGS Alignment: Examination moderately degraded by motion artifact. Mild straightening of the normal cervical lordosis. No listhesis or subluxation. Vertebrae: Vertebral body height maintained without evidence for acute or chronic fracture. Bone marrow signal intensity diffusely heterogeneous but within normal limits. No worrisome osseous lesions or metastatic disease identified on this motion degraded noncontrast examination. No abnormal  marrow edema. Cord: Signal intensity within the cervical spinal cord grossly within normal limits allowing for motion artifact. Posterior Fossa, vertebral arteries, paraspinal tissues: Left mastoid effusion partially visualized. Visualized brain and posterior fossa within normal limits. Craniocervical junction normal. Superficial skin lesion partially seen involving the right upper back (series 9, image 38), better seen on previous thoracic spine MRI. Paraspinous and prevertebral soft tissues otherwise within normal limits. Normal intravascular flow voids seen within the vertebral arteries bilaterally. Disc levels: C2-C3: Shallow central disc protrusion mildly indents the ventral thecal sac. Protruding disc contacts the ventral spinal cord without significant stenosis or cord deformity. Superimposed mild left facet degeneration. No significant foraminal narrowing. C3-C4: Shallow broad-based central disc protrusion indents the ventral thecal sac, contacting the ventral  spinal cord. Resultant mild spinal stenosis without definite cord deformity. Superimposed bilateral facet hypertrophy. Foramina remain widely patent. C4-C5: Mild disc bulge. Flattening and partial effacement of the ventral thecal sac without significant spinal stenosis or cord deformity. Foramina remain widely patent. C5-C6: Minimal disc bulge with endplate and uncovertebral spurring. No significant spinal stenosis. Foramina remain patent. C6-C7: Mild uncovertebral hypertrophy without disc bulge. Left-sided facet degeneration. No significant spinal stenosis. Mild left C7 foraminal narrowing. No significant right foraminal stenosis. C7-T1: Negative interspace. Left greater than right facet hypertrophy. No spinal stenosis. Mild left C8 foraminal narrowing. Visualized upper thoracic spine within normal limits. MRI LUMBAR SPINE FINDINGS Segmentation: Examination mild to moderately degraded by motion artifact. Normal segmentation. Lowest well-formed disc  space labeled the L5-S1 level. Alignment: Trace anterolisthesis of L4 on L5 and L5 on S1, chronic and facet mediated. Alignment otherwise normal with preservation of the normal lumbar lordosis. Vertebrae: Vertebral body height maintained without evidence for acute or chronic fracture. Bone marrow signal intensity within normal limits. No evidence for metastatic disease within the lumbar spine. No worrisome osseous lesions. Subcentimeter benign hemangioma noted within the L5 vertebral body. No abnormal marrow edema. Conus medullaris and cauda equina: Conus extends to the L1 level. Conus and cauda equina appear grossly normal allowing for motion artifact. Paraspinal and other soft tissues: Paraspinous soft tissues within normal limits. Partially visualized visceral structures are grossly unremarkable. Atherosclerotic change noted within the intra-abdominal aorta. No aneurysm. Disc levels: L1-2: Mild diffuse disc bulge with disc desiccation. Mild to moderate facet hypertrophy. Mild prominence of the dorsal epidural fat. No canal or foraminal stenosis. L2-3: Mild annular disc bulge with disc desiccation. Mild to moderate facet hypertrophy. Mild prominence of the dorsal epidural fat. No canal or foraminal stenosis. L3-4: Mild annular disc bulge with disc desiccation, slightly asymmetric to the right. Moderate facet and ligament flavum hypertrophy. Borderline mild spinal stenosis. Mild bilateral L3 foraminal narrowing. L4-5: Trace anterolisthesis. No significant disc bulge. Moderate bilateral facet hypertrophy, slightly greater on the left. Prominence of the dorsal epidural fat. Borderline mild spinal stenosis. Mild right L4 foraminal stenosis. No significant left foraminal narrowing. L5-S1: Trace anterolisthesis. Disc desiccation. Shallow broad-based central disc protrusion closely approximates the descending S1 nerve roots without frank neural impingement or displacement. Moderate bilateral facet hypertrophy. Epidural  lipomatosis. No significant canal or lateral recess stenosis. Mild bilateral L5 foraminal narrowing. IMPRESSION: MRI CERVICAL SPINE IMPRESSION: 1. No evidence for metastatic disease within the cervical spine on this noncontrast and motion degraded exam. 2. Indeterminate superficial skin lesion involving the right upper posterior thorax, partially visualized, and better seen on prior thoracic spine MRI from 02/11/2020. Finding also described on previous PET-CT from October of 2020. 3. Mild multilevel cervical spondylosis with resultant mild spinal stenosis at C3-4, with mild left C7 and C8 foraminal narrowing. MRI LUMBAR SPINE IMPRESSION: 1. No evidence for metastatic disease within the lumbar spine on this non-contrast and motion degraded exam. 2. Shallow central disc protrusion at L5-S1, closely approximating the descending S1 nerve roots without frank neural impingement 3. Mild to moderate facet hypertrophy throughout the lumbar spine, most pronounced at L4-5 and L5-S1. Finding could contribute to underlying lower back pain. Electronically Signed   By: Jeannine Boga [name]   On: 02/12/2020 23:05     ASSESSMENT:  64 y.o. [place] woman with stage IV breast cancer manifested chiefly by loco-regional nodal disease (neck, chest) and skin involvement, without liver, bone, or brain metastases documented   (1) Status post right upper inner  quadrant lumpectomy and sentinel lymph node sampling 03/04/2002 for 2 separate foci of invasive ductal carcinoma, mpT2 pN1 or stage IIB, both foci grade 3, both estrogen and progesterone receptor positive, both HercepTest 3+, Mib-1 56%  (2) Reexcision for margins 05/27/2002 showed no residual cancer in the breast.  (3) The patient refused adjuvant systemic therapy.  (4) Adjuvant radiation treatment completed 08/14/2002.  (5) recurrence in the right breast in 02/2004 showing a morphologically different tumor, again grade 3, again estrogen and progesterone receptor  negative, with an MIB-1 of 14% and Herceptest 3+.  (5) Between 03/2004 and 07/2004 she received dose dense Doxorubicin/Cyclophosphamide x 4 given with trastuzumab, followed by weekly Docetaxel x 8, again given with trastuzumab.  (6) Right mastectomy 07/13/2004 showed scattered microscopic foci of residual disease over an area  greater than 5 cm. Margins were negative.  (7) Postoperative Docetaxel continued until 09/2004.  (8) Trastuzumab (Herceptin) given 08/2004 through 01/2012 with some brief interruptions.  RECURRENT/ STAGE IV DISEASE December 2006 (9) Isolated right cervical nodal recurrence 10/2005, treated with radiation to the right supraclavicular area (total 41.5 gray) completed 12/29/2005.  (10) Navelbine given together with Herceptin  11/2005 through 03/2006.  (9) Left mastectomy 02/13/2006 for ductal carcinoma in situ, grade 2, estrogen and progesterone receptor negative, with negative margins; 0 of 3 lymph nodes involved  (10) treated with Lapatinib and Capecitabine before 10/2009, for an unclear duration and with unclear results (cannot locate data on chart review).  (11) Status post right supraclavicular lymph node biopsy 09/2010 again positive for an invasive ductal carcinoma, estrogen and progesterone receptor negative, HER-2 positive by [facility] with a ratio 4.25.  (12) Navelbine given together with Herceptin between 05/2011 and 11/2011.  (13) Carboplatin/ Gemcitabine/ Herceptin given for 2 cycles, in 12/2011 and 01/2012.  (14) TDM-1 (Kadcyla) started 02/2012. Last dose 10/02/2013 after which the patient discontinued treatment at her own discretion. Echo on December 2014 showed a well preserved ejection fraction.  (15) Deep vein thrombosis of the right upper extremity documented 04/20/2011.  She completed anticoagulant therapy with Coumadin on 03/25/2013.  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 Left chest Port-A-Cath placed on 04/08/2013; being flushed every 6 weeks  RIGHT UPPER EXTREMITY ANGIOSARCOMA VS BREAST CANCER: August 2016 (18) treated at [facility] [place] August 2016 with paclitaxel, trastuzumab and pertuzumab 1 cycle, afterwards referred to hospice  (19) under the care of hospice of [facility] August 2016 to 05/08/2016, when the patient opted for a second try at chemotherapy  (20) started low-dose Abraxane, trastuzumab and pertuzumab 06/07/2016, to be repeated every 4 weeks.   (a) pretreatment echocardiogram 06/06/2016 showed a 60-65% ejection fraction  (b) significant compliance problems compromise response to treatment  (c)  echocardiogram 02/20/17 LVEF 55-60%  (d) Abraxane discontinued 07/04/2017 because of possible neuropathy symptoms  (e) Abraxane resumed 09/27/2017  (f) echocardiogram 07/20/2017 showed an ejection fraction of 60-65%  (g) echo 01/22/2018 showed an ejection fraction of 50-55%  (h) CT scan of the chest 05/03/2018 shows no disease involving the lungs liver or lymph nodes, with stable subcutaneous masses as previously noted  (I) Abraxane discontinued August 2019 because of concerns regarding neuropathy-- gemcitabine substituted  (j) echocardiogram 08/20/2018 shows an ejection fraction in the 55-60% range (done every 6 months)  (k) gemcitabine discontinued because of  multiple symptoms, Abraxane restarted 09/11/2018, given once every 4 weeks  (l) echocardiogram 07/17/2019 shows a well-preserved ejection fraction at 55-60%  (m) restaging PET scan 08/20/2019 shows hypermetabolic adenopathy, subcutaneous malignancy as clinically appreciated, but no liver or lung parenchymal lesions   (21) Chronic pain secondary to known metastatic disease  (a) patient signed pain contract 10/19/2016  (b)  as of 11/16/2016 receives her pain medications from Dr [name]  (c)  Dr [name] no longer able to prescribe as of February 2019  (d) all pain medications now through [facility], Dr Mirna Mires (as of April 2019)  (22) left breast and left axillary adenopathy noted clinically  (a) left axillary lymph node biopsy on 07/24/2019 benign  (23) BRAIN METASTASES: frontal and temporal lobe metastases noted on brain MRI 09/04/2019  (a) adjuvant radiation 09/11/2019-10/16/2019:    The brain was treated to 30 Gy in 10 fractions.   (b) tucatinib prescribed December 2020 at 150 mg daily because of methadone interaction, finally started March 2021 but taken irregularly by the patient.  (c) noncontrast head CT 01/12/2020 stable lesions/improved edema  (d) brain MRI 02/01/2020 shows decreased size of the right frontal and temporal lobe metastases with mild residual edema, no mass-effect, and no evidence of new mets  (e) noncontrast MRI of the cervical, thoracic, and lumbar spine 02/11/2020 and 02/12/2020 showed no metastatic disease  (24) COVID-19 infection 2019-11-19   PLAN: Astryd is now 14-1/2 years out from definitive diagnosis of metastatic breast cancer.  Her peripheral disease is generally well controlled on Abraxane plus anti-HER-2 treatment with trastuzumab and Pertuzumab.  She tolerates this well and particularly denies any peripheral neuropathy symptoms.  She now has central nervous system disease, documented October 2020.  She had radiation therapy for these lesions and the most recent brain MRI less than a month ago showed improvement in the lesions.  Noncontrast MRI of the spine were negative.  This is favorable.  I urged Zakyah to start the tucatinib.  We are using a low dose because of the possible interaction with methadone.  She says she is willing to give it a try.  We are continuing the peripheral treatments every 4 weeks and she is scheduled for repeat brain MRI  04/16/2020.  I should add that Jamileth frequently does not show for treatment sore and has to be rescheduled.  Also there can be significant variations in her weight depending on how her diet.  I am going to keep the trastuzumab and Pertuzumab dose fixed as this is a chronic maintenance and appears to be working well as currently.  I am going to see her on a monthly basis for the next 42month to make sure her situation remains stable.  Note that all her pain medicines are under the care of Dr. PMirna Mires  Total encounter time 30 minutes.*  Jackie Littlejohn, GVirgie Dad MD  02/23/20 2:13 PM Medical [facility] [id number]. [phone]   Fax. [phone]  I, KWilburn Mylar am acting as scribe for Dr. GVirgie Dad Reygan Heagle.  I, GLurline DelMD, have reviewed the above documentation for accuracy and completeness, and I agree with the above.   *Total Encounter Time as defined by the Centers for Medicare and Medicaid Services includes, in addition to the face-to-face time of a patient visit (documented in the note above) non-face-to-face time: obtaining and reviewing outside history, ordering and reviewing medications, tests or procedures, care coordination (communications with  other health care professionals or caregivers) and documentation in the medical record.

## 2020-02-21 ENCOUNTER — Inpatient Hospital Stay: Payer: Medicare Other

## 2020-02-21 ENCOUNTER — Inpatient Hospital Stay (HOSPITAL_BASED_OUTPATIENT_CLINIC_OR_DEPARTMENT_OTHER): Payer: Medicare Other | Admitting: Oncology

## 2020-02-21 ENCOUNTER — Other Ambulatory Visit: Payer: Self-pay

## 2020-02-21 ENCOUNTER — Other Ambulatory Visit: Payer: Self-pay | Admitting: *Deleted

## 2020-02-21 ENCOUNTER — Other Ambulatory Visit: Payer: Self-pay | Admitting: Oncology

## 2020-02-21 VITALS — BP 118/60 | HR 80

## 2020-02-21 VITALS — BP 131/113 | HR 90 | Temp 98.9°F | Resp 17 | Ht 67.0 in | Wt 204.7 lb

## 2020-02-21 DIAGNOSIS — C4911 Malignant neoplasm of connective and soft tissue of right upper limb, including shoulder: Secondary | ICD-10-CM

## 2020-02-21 DIAGNOSIS — C778 Secondary and unspecified malignant neoplasm of lymph nodes of multiple regions: Secondary | ICD-10-CM | POA: Diagnosis not present

## 2020-02-21 DIAGNOSIS — C50211 Malignant neoplasm of upper-inner quadrant of right female breast: Secondary | ICD-10-CM | POA: Diagnosis present

## 2020-02-21 DIAGNOSIS — C7931 Secondary malignant neoplasm of brain: Secondary | ICD-10-CM

## 2020-02-21 DIAGNOSIS — Z170 Estrogen receptor positive status [ER+]: Secondary | ICD-10-CM

## 2020-02-21 DIAGNOSIS — Z923 Personal history of irradiation: Secondary | ICD-10-CM | POA: Diagnosis not present

## 2020-02-21 DIAGNOSIS — C792 Secondary malignant neoplasm of skin: Secondary | ICD-10-CM | POA: Diagnosis not present

## 2020-02-21 DIAGNOSIS — Z171 Estrogen receptor negative status [ER-]: Secondary | ICD-10-CM | POA: Diagnosis not present

## 2020-02-21 DIAGNOSIS — Z95828 Presence of other vascular implants and grafts: Secondary | ICD-10-CM

## 2020-02-21 DIAGNOSIS — Z5112 Encounter for antineoplastic immunotherapy: Secondary | ICD-10-CM | POA: Diagnosis present

## 2020-02-21 LAB — CMP (CANCER CENTER ONLY)
ALT: 13 U/L (ref 0–44)
AST: 23 U/L (ref 15–41)
Albumin: 3.2 g/dL — ABNORMAL LOW (ref 3.5–5.0)
Alkaline Phosphatase: 91 U/L (ref 38–126)
Anion gap: 13 (ref 5–15)
BUN: 13 mg/dL (ref 8–23)
CO2: 24 mmol/L (ref 22–32)
Calcium: 9 mg/dL (ref 8.9–10.3)
Chloride: 106 mmol/L (ref 98–111)
Creatinine: 1.57 mg/dL — ABNORMAL HIGH (ref 0.44–1.00)
GFR, Est AFR Am: 40 mL/min — ABNORMAL LOW (ref >60)
GFR, Estimated: 34 mL/min — ABNORMAL LOW (ref >60)
Glucose, Bld: 136 mg/dL — ABNORMAL HIGH (ref 70–99)
Potassium: 3.4 mmol/L — ABNORMAL LOW (ref 3.5–5.1)
Sodium: 143 mmol/L (ref 135–145)
Total Bilirubin: 0.2 mg/dL — ABNORMAL LOW (ref 0.3–1.2)
Total Protein: 7.2 g/dL (ref 6.5–8.1)

## 2020-02-21 LAB — CBC WITH DIFFERENTIAL (CANCER CENTER ONLY)
Abs Immature Granulocytes: 0.15 10*3/uL — ABNORMAL HIGH (ref 0.00–0.07)
Basophils Absolute: 0.1 10*3/uL (ref 0.0–0.1)
Basophils Relative: 1 %
Eosinophils Absolute: 0.2 10*3/uL (ref 0.0–0.5)
Eosinophils Relative: 3 %
HCT: 33.2 % — ABNORMAL LOW (ref 36.0–46.0)
Hemoglobin: 10 g/dL — ABNORMAL LOW (ref 12.0–15.0)
Immature Granulocytes: 2 %
Lymphocytes Relative: 12 %
Lymphs Abs: 1 10*3/uL (ref 0.7–4.0)
MCH: 27.8 pg (ref 26.0–34.0)
MCHC: 30.1 g/dL (ref 30.0–36.0)
MCV: 92.2 fL (ref 80.0–100.0)
Monocytes Absolute: 1.1 10*3/uL — ABNORMAL HIGH (ref 0.1–1.0)
Monocytes Relative: 14 %
Neutro Abs: 5.3 10*3/uL (ref 1.7–7.7)
Neutrophils Relative %: 68 %
Platelet Count: 282 10*3/uL (ref 150–400)
RBC: 3.6 MIL/uL — ABNORMAL LOW (ref 3.87–5.11)
RDW: 16.6 % — ABNORMAL HIGH (ref 11.5–15.5)
WBC Count: 7.8 10*3/uL (ref 4.0–10.5)
nRBC: 0.4 % — ABNORMAL HIGH (ref 0.0–0.2)

## 2020-02-21 MED ORDER — SODIUM CHLORIDE 0.9% FLUSH
10.0000 mL | INTRAVENOUS | Status: DC | PRN
  Administered 2020-02-21: 10 mL
  Filled 2020-02-21: qty 10

## 2020-02-21 MED ORDER — SODIUM CHLORIDE 0.9 % IV SOLN
Freq: Once | INTRAVENOUS | Status: AC
  Filled 2020-02-21: qty 250

## 2020-02-21 MED ORDER — SODIUM CHLORIDE 0.9% FLUSH
10.0000 mL | Freq: Once | INTRAVENOUS | Status: AC
  Administered 2020-02-21: 09:00:00 10 mL
  Filled 2020-02-21: qty 10

## 2020-02-21 MED ORDER — ACETAMINOPHEN 325 MG PO TABS
ORAL_TABLET | ORAL | Status: AC
  Filled 2020-02-21: qty 2

## 2020-02-21 MED ORDER — PROCHLORPERAZINE MALEATE 10 MG PO TABS
10.0000 mg | ORAL_TABLET | Freq: Once | ORAL | Status: AC
  Administered 2020-02-21: 10 mg via ORAL

## 2020-02-21 MED ORDER — PROCHLORPERAZINE MALEATE 10 MG PO TABS
ORAL_TABLET | ORAL | Status: AC
  Filled 2020-02-21: qty 1

## 2020-02-21 MED ORDER — PACLITAXEL PROTEIN-BOUND CHEMO INJECTION 100 MG
100.0000 mg/m2 | Freq: Once | INTRAVENOUS | Status: AC
  Administered 2020-02-21: 200 mg via INTRAVENOUS
  Filled 2020-02-21: qty 40

## 2020-02-21 MED ORDER — TRASTUZUMAB-ANNS CHEMO 150 MG IV SOLR
600.0000 mg | Freq: Once | INTRAVENOUS | Status: AC
  Administered 2020-02-21: 600 mg via INTRAVENOUS
  Filled 2020-02-21: qty 28.57

## 2020-02-21 MED ORDER — SODIUM CHLORIDE 0.9 % IV SOLN
420.0000 mg | Freq: Once | INTRAVENOUS | Status: AC
  Administered 2020-02-21: 420 mg via INTRAVENOUS
  Filled 2020-02-21: qty 14

## 2020-02-21 MED ORDER — HEPARIN SOD (PORK) LOCK FLUSH 100 UNIT/ML IV SOLN
500.0000 [IU] | Freq: Once | INTRAVENOUS | Status: AC | PRN
  Administered 2020-02-21: 500 [IU]
  Filled 2020-02-21: qty 5

## 2020-02-21 MED ORDER — DIPHENHYDRAMINE HCL 25 MG PO CAPS
ORAL_CAPSULE | ORAL | Status: AC
  Filled 2020-02-21: qty 1

## 2020-02-21 MED ORDER — ACETAMINOPHEN 325 MG PO TABS
650.0000 mg | ORAL_TABLET | Freq: Once | ORAL | Status: AC
  Administered 2020-02-21: 650 mg via ORAL

## 2020-02-21 MED ORDER — DIPHENHYDRAMINE HCL 25 MG PO CAPS
25.0000 mg | ORAL_CAPSULE | Freq: Once | ORAL | Status: AC
  Administered 2020-02-21: 25 mg via ORAL

## 2020-02-21 NOTE — Patient Instructions (Signed)
[facility] Discharge Instructions for Patients Receiving Chemotherapy  Today you received the following chemotherapy agents Trastuzumab, Perjeta and Abraxane  To help prevent nausea and vomiting after your treatment, we encourage you to take your nausea medication as directed.    If you develop nausea and vomiting that is not controlled by your nausea medication, call the clinic.   BELOW ARE SYMPTOMS THAT SHOULD BE REPORTED IMMEDIATELY:  *FEVER GREATER THAN 100.5 F  *CHILLS WITH OR WITHOUT FEVER  NAUSEA AND VOMITING THAT IS NOT CONTROLLED WITH YOUR NAUSEA MEDICATION  *UNUSUAL SHORTNESS OF BREATH  *UNUSUAL BRUISING OR BLEEDING  TENDERNESS IN MOUTH AND THROAT WITH OR WITHOUT PRESENCE OF ULCERS  *URINARY PROBLEMS  *BOWEL PROBLEMS  UNUSUAL RASH Items with * indicate a potential emergency and should be followed up as soon as possible.  Feel free to call the clinic should you have any questions or concerns. The clinic phone number is [phone].  Please show the CHEMO ALERT CARD at check-in to the Emergency Department and triage nurse.   

## 2020-02-21 NOTE — Patient Instructions (Signed)

## 2020-02-21 NOTE — Progress Notes (Signed)
Per Dr. Jana Hakim, will dose Kanjinti at 600 mg today. After today, Dr. Jana Hakim wants Kanjinti 450 mg monthly as a flat dose.  Kennith Center, Pharm.D., CPP 02/21/2020@12 :04 PM

## 2020-02-21 NOTE — Progress Notes (Signed)
Ok to treat with labs today per Santiago Bur, RN per MD [name]

## 2020-02-23 ENCOUNTER — Ambulatory Visit (HOSPITAL_COMMUNITY)
Admission: EM | Admit: 2020-02-23 | Discharge: 2020-02-23 | Disposition: A | Discharge status: Home / Self Care | Payer: Medicare Other | Attending: Family Medicine | Admitting: Family Medicine

## 2020-02-23 ENCOUNTER — Other Ambulatory Visit: Payer: Self-pay

## 2020-02-23 ENCOUNTER — Encounter (HOSPITAL_COMMUNITY): Payer: Self-pay

## 2020-02-23 DIAGNOSIS — R600 Localized edema: Secondary | ICD-10-CM

## 2020-02-23 MED ORDER — TORSEMIDE 10 MG PO TABS: 10.0000 mg | ORAL_TABLET | Freq: Every day | ORAL | 1 refills | Status: DC

## 2020-02-23 NOTE — ED Triage Notes (Signed)
Pt c/o bilat feet/leg pain increased edema BLE over past week. Pt states edema has decreased ability to ambulate and has resulted in falls. Reports falling this morning but denies trauma/injury, LOC at time of fall.  +3 edema to right calf/foot noted; right calf edema > left leg. Left foot +3 edema.  Denies SOB, CP.

## 2020-02-23 NOTE — ED Provider Notes (Signed)
[place]    CSN: [id number] Arrival date & time: 02/23/20  1052      History   Chief Complaint Chief Complaint  Patient presents with  . Leg Pain  . BLE edema    HPI [name] is a 64 y.o. female.   Established [facility] patient.  Pt c/o bilat feet/leg pain increased edema BLE over past week. Pt states edema has decreased ability to ambulate and has resulted in falls. Reports falling this morning but denies trauma/injury, LOC at time of fall.  +3 edema to right calf/foot noted; right calf edema > left leg. Left foot +3 edema.  Denies SOB, CP.  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   Note from 3/20: Patient reports with complaints of bilateral foot pain and swelling for weeks.  She cannot recall an exact date in which this started.  Patient reports that she has been seen in the ED for this as well as with her oncologist.  Patient has history significant for chronic opioid use, breast cancer, with brain lesions, DVT in multiple other problems per chart review.  She is stating that she needs a refill on her oxycodone, but cannot recall what it was last filled her when she last took her medication.  Patient cannot recall which medication she is on, or how she is supposed to take them.  Reports that she thinks she may have gout, and that she may need something for that.  Denies fever, headache, vomiting, nausea, diarrhea, rash, other symptoms.       Past Medical History:  Diagnosis Date  . Breast cancer ([place])   . DVT (deep venous thrombosis) ([place]) 10/07/2011  . Fever 02/06/2018  . Hypertension   . Radiation 11/29/2005-12/29/2005   right supraclavicular area 4147 cGy  . Radiation 06/26/02-08/14/02   right breast 5040 cGy, tumor bed boosted to 1260 cGy  . Rheumatic fever     Patient Active Problem List   Diagnosis Date Noted  . UTI (urinary tract infection) 02/11/2020  . Acute metabolic encephalopathy  123456  . Generalized weakness 02/11/2020  . Hypokalemia 02/11/2020  . Paresis of left lower extremity ([place]) 02/11/2020  . Paresis of right lower extremity ([place]) 02/11/2020  . Drug-induced polyneuropathy ([place]) 12/19/2019  . Acute respiratory failure with hypoxia ([place]) 11/19/2019  . COVID-19 virus infection 11/19/2019  . Overdose opiate, accidental or unintentional, initial encounter ([place]) 11/19/2019  . Acute encephalopathy 11/19/2019  . Metastasis to brain ([place]) 09/13/2019  . Acute lower UTI 02/06/2018  . Altered mental status 02/06/2018  . Bacteremia 02/06/2018  . Polypharmacy 02/06/2018  . Goals of care, counseling/discussion 09/05/2017  . Pneumonia 01/25/2017  . CAP (community acquired pneumonia) 01/23/2017  . Port catheter in place 05/30/2016  . 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 Chronic pain 04/13/2015  . Left arm pain 08/18/2014  . Malignant neoplasm of upper-inner quadrant of right breast in female, estrogen receptor positive ([place]) 10/03/2013  . Post-lymphadenectomy lymphedema of arm 08/15/2013  . Port or reservoir infection 01/29/2013  . DVT (deep venous thrombosis) ([place]) 10/07/2011  . Chest pain 09/10/2009  . Secondary cardiomyopathy ([place]) 09/02/2009  . Essential hypertension 02/26/2009  . GERD 02/26/2009    Past Surgical History:  Procedure Laterality Date  . BREAST SURGERY    . MASTECTOMY Bilateral   . PORT A CATH REVISION      OB History   No obstetric history on file.  Home Medications    Prior to Admission medications   Medication Sig Start Date End Date Taking? Authorizing Provider  allopurinol (ZYLOPRIM) 300 MG tablet Take 300 mg by mouth daily. 01/20/20   [provider]  ALPRAZolam Duanne Moron) 1 MG tablet Take 1 tablet (1 mg total) by mouth at bedtime as needed for sleep. 01/13/20   Truitt Merle, MD  Ascorbic Acid (VITAMIN C PO) Take 1 tablet by mouth daily.    [provider]  cholecalciferol  (VITAMIN D) 1000 units tablet Take 1 tablet (1,000 Units total) by mouth daily. Patient not taking: Reported on 11/19/2019 05/16/18   [name], [name], MD  cholecalciferol (VITAMIN D3) 25 MCG (1000 UNIT) tablet Take 1,000 Units by mouth daily.    [provider]  gabapentin (NEURONTIN) 100 MG capsule Take 1 capsule (300 mg total) by mouth 2 (two) times daily as needed. 02/18/20   [name], [name], MD  magic mouthwash SOLN Take 10 mLs by mouth 4 (four) times daily as needed for mouth pain. Swish and spit.  10/18/19   [provider]  methadone (DOLOPHINE) 10 MG tablet Take 1 tablet (10 mg total) by mouth daily. Taking once a day 02/14/20   Rai, Vernelle Emerald, MD  Multiple Vitamin (MULTIVITAMIN) capsule Take 1 capsule by mouth daily.      [provider]  naloxone [facility]) nasal spray 4 mg/0.1 mL Administer single spray of naloxone into one nostril. Additionally, spray can be given every 2-3 minutes as needed until emergency medical assistance arrives. Call 911 immediately after use. Patient not taking: Reported on 01/15/2020 10/25/19   [name], [name], MD  omeprazole (PRILOSEC) 40 MG capsule Take 1 capsule (40 mg total) by mouth daily. 02/04/20   [name], [name], MD  ondansetron (ZOFRAN) 4 MG tablet TAKE 1 TABLET BY MOUTH THREE TIMES A DAY AS NEEDED Patient taking differently: Take 4 mg by mouth every 8 (eight) hours as needed for nausea or vomiting.  01/23/20   [name], [name], MD  Oxycodone HCl 20 MG TABS Take 0.5 tablets (10 mg total) by mouth 3 (three) times daily as needed (pain). 02/14/20   Rai, Ripudeep K, MD  polyethylene glycol (MIRALAX / GLYCOLAX) 17 g packet Take 17 g by mouth daily as needed for mild constipation. Patient not taking: Reported on 01/15/2020 11/21/19   Mercy Riding, MD  prochlorperazine (COMPAZINE) 10 MG tablet Take 1 tablet (10 mg total) by mouth every 6 (six) hours as needed for nausea or vomiting. 01/13/20   Truitt Merle, MD  sertraline (ZOLOFT) 100  MG tablet TAKE 1 TABLET BY MOUTH EVERY DAY 12/23/19   [name], [name], MD  torsemide (DEMADEX) 10 MG tablet Take 1 tablet (10 mg total) by mouth daily. 02/23/20   Robyn Haber, MD  tucatinib (TUKYSA) 150 MG tablet Take 1 tablet (150 mg total) by mouth every morning. Take as directed by MD. 12/18/19   [name], [name], MD    Family History Family History  Problem Relation Age of Onset  . Pancreatic cancer Mother   . Kidney cancer Sister 60  . Breast cancer Sister 57  . Breast cancer Other 50    Social History Social History   Tobacco Use  . Smoking status: Former Research scientist (life sciences)  . Smokeless tobacco: Never Used  Substance Use Topics  . Alcohol use: Yes    Comment: Rarely  . Drug use: No     Allergies   Tramadol, Hydrocodone-acetaminophen, and Pork-derived products  Review of Systems Review of Systems  Cardiovascular: Positive for leg swelling.  All other systems reviewed and are negative.    Physical Exam Triage Vital Signs ED Triage Vitals  Enc Vitals Group     BP 02/23/20 1147 (!) 114/59     Pulse Rate 02/23/20 1147 94     Resp 02/23/20 1147 18     Temp 02/23/20 1147 98.9 F (37.2 C)     Temp Source 02/23/20 1147 Oral     SpO2 02/23/20 1147 100 %     Weight --      Height --      Head Circumference --      Peak Flow --      Pain Score 02/23/20 1143 7     Pain Loc --      Pain Edu? --      Excl. in [place]? --    No data found.  Updated Vital Signs BP (!) 114/59 (BP Location: Left Arm)   Pulse 94   Temp 98.9 F (37.2 C) (Oral)   Resp 18   SpO2 100%   Physical Exam Vitals and nursing note reviewed.  Constitutional:      General: She is not in acute distress.    Appearance: She is obese.  Eyes:     Conjunctiva/sclera: Conjunctivae normal.  Cardiovascular:     Rate and Rhythm: Normal rate.  Pulmonary:     Effort: Pulmonary effort is normal.  Musculoskeletal:        General: Swelling present.     Cervical back: Normal range of motion and neck  supple.  Skin:    General: Skin is warm and dry.  Neurological:     General: No focal deficit present.     Mental Status: She is alert.  Psychiatric:        Mood and Affect: Mood normal.        Behavior: Behavior normal.      UC Treatments / Results  Labs (all labs ordered are listed, but only abnormal results are displayed) Labs Reviewed - No data to display  EKG   Radiology No results found.  Procedures Procedures (including critical care time)  Medications Ordered in UC Medications - No data to display  Initial Impression / Assessment and Plan / UC Course  I have reviewed the triage vital signs and the nursing notes.  Pertinent labs & imaging results that were available during my care of the patient were reviewed by me and considered in my medical decision making (see chart for details).    Final Clinical Impressions(s) / UC Diagnoses   Final diagnoses:  Pedal edema     Discharge Instructions     Follow up with Dr. Jana Hakim    ED Prescriptions    Medication Sig Dispense Auth. Provider   torsemide (DEMADEX) 10 MG tablet Take 1 tablet (10 mg total) by mouth daily. 10 tablet Robyn Haber, MD     I have reviewed the PDMP during this encounter.   Robyn Haber, MD 02/23/20 1222

## 2020-02-23 NOTE — Discharge Instructions (Addendum)
Follow-up with Dr. [name] 

## 2020-02-24 ENCOUNTER — Telehealth: Payer: Self-pay | Admitting: Oncology

## 2020-02-24 IMAGING — CR DG CHEST 2V
2 series · 2 of 2 positions shown · non-contrast
Comparison: 09/24/2019

CLINICAL DATA: Productive cough.  History of breast cancer

EXAM:
CHEST - 2 VIEW

[w chest pa]
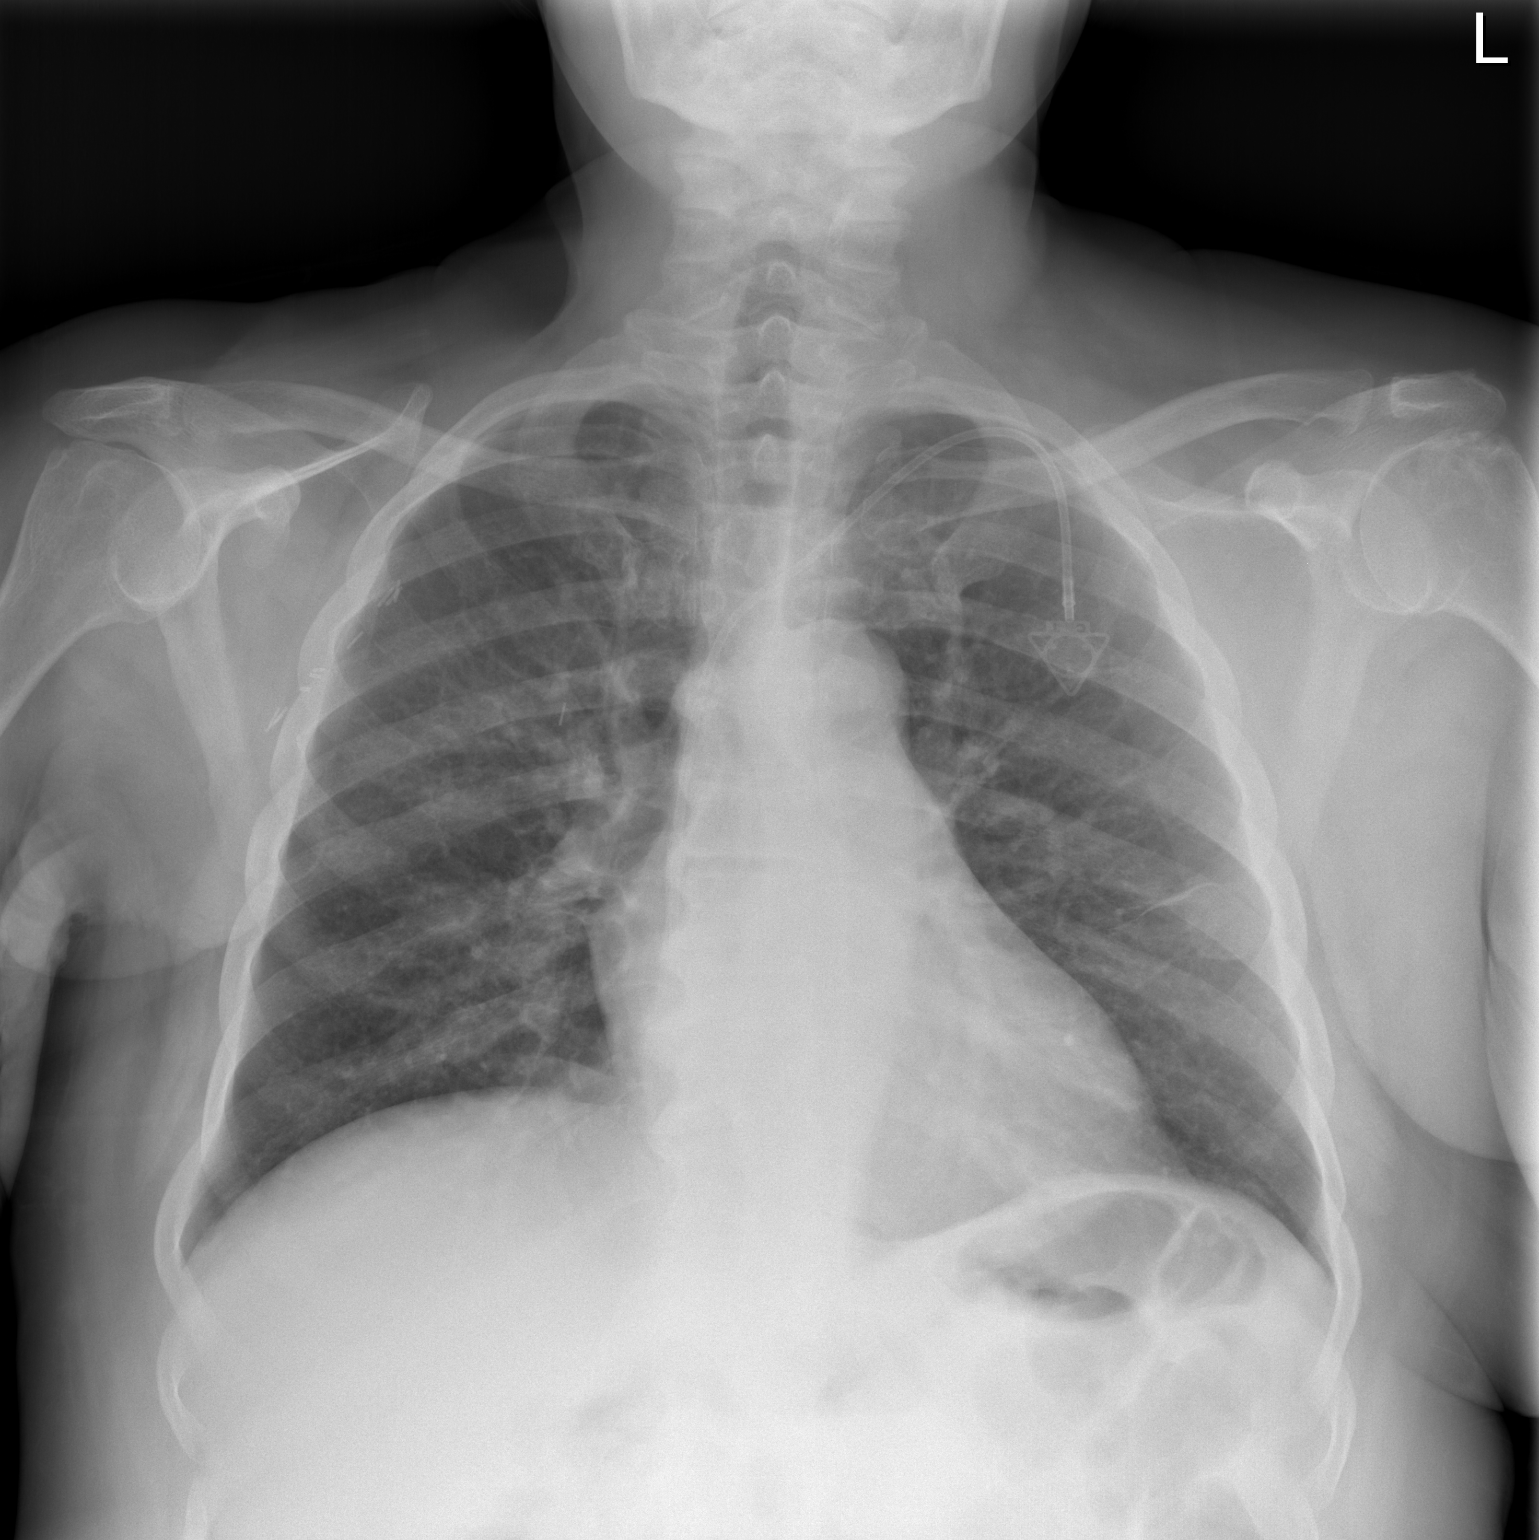

[w chest lat]
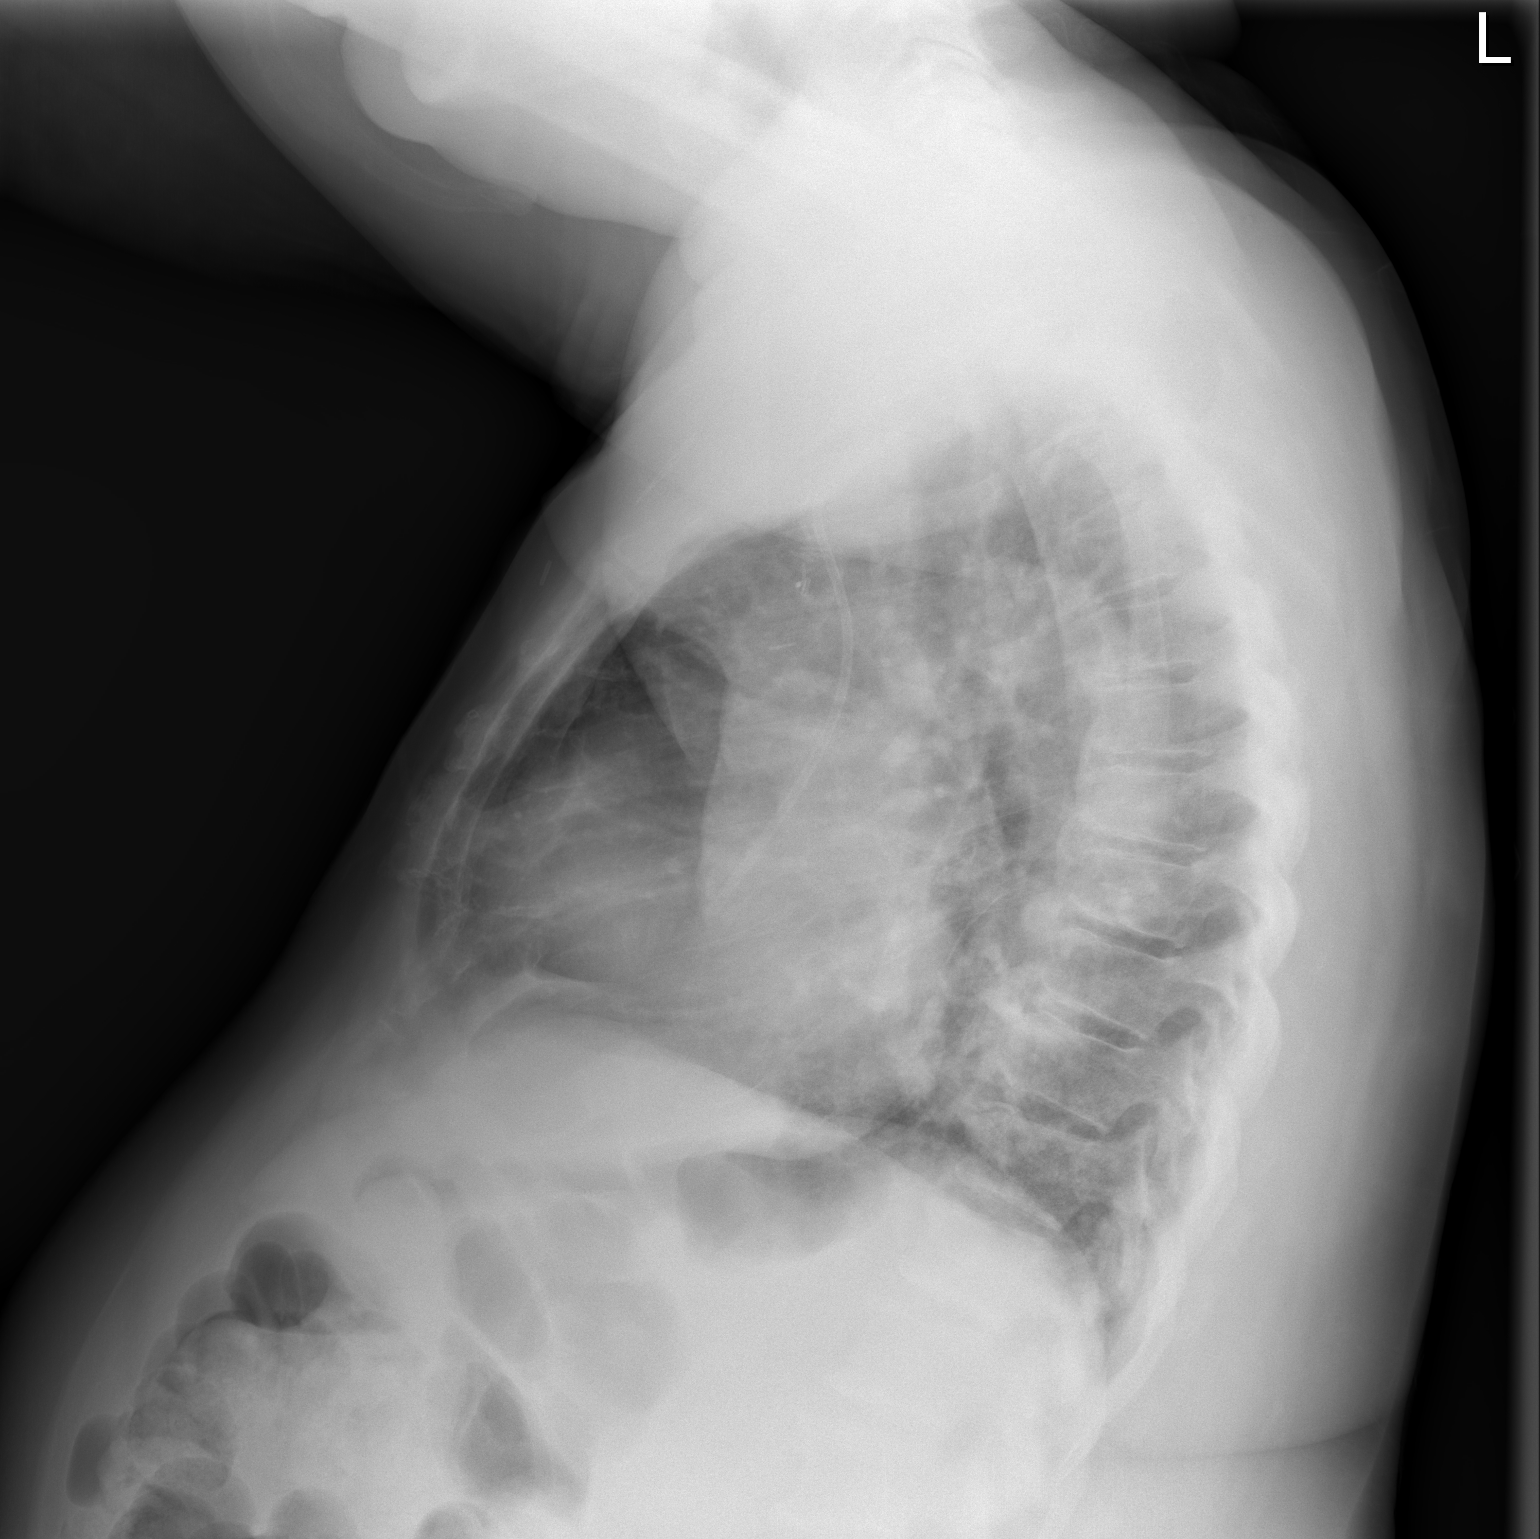

[2 of 2 positions shown; findings below may reference images not displayed]

FINDINGS: Heart size and vascularity normal. Lungs are well aerated and clear.
No focal area of infiltrate or effusion.

Port-A-Cath tip at the cavoatrial junction unchanged. Thoracic
degenerative change and spurring.
IMPRESSION: No active cardiopulmonary disease.

## 2020-02-24 NOTE — Telephone Encounter (Signed)
Scheduled appt per 4/16 los. Left voicemail with new appt date and time.

## 2020-02-29 ENCOUNTER — Inpatient Hospital Stay (HOSPITAL_COMMUNITY)
Admission: EM | Admit: 2020-02-29 | Discharge: 2020-03-02 | DRG: 641 | Disposition: A | Discharge status: Home Health | Payer: Medicare Other | Attending: Internal Medicine | Admitting: Internal Medicine

## 2020-02-29 ENCOUNTER — Other Ambulatory Visit: Payer: Self-pay

## 2020-02-29 DIAGNOSIS — Z79899 Other long term (current) drug therapy: Secondary | ICD-10-CM

## 2020-02-29 DIAGNOSIS — Z6832 Body mass index (BMI) 32.0-32.9, adult: Secondary | ICD-10-CM | POA: Diagnosis not present

## 2020-02-29 DIAGNOSIS — E876 Hypokalemia: Secondary | ICD-10-CM | POA: Diagnosis present

## 2020-02-29 DIAGNOSIS — Z885 Allergy status to narcotic agent status: Secondary | ICD-10-CM

## 2020-02-29 DIAGNOSIS — K5900 Constipation, unspecified: Secondary | ICD-10-CM | POA: Diagnosis present

## 2020-02-29 DIAGNOSIS — G894 Chronic pain syndrome: Secondary | ICD-10-CM | POA: Diagnosis present

## 2020-02-29 DIAGNOSIS — E860 Dehydration: Secondary | ICD-10-CM | POA: Diagnosis present

## 2020-02-29 DIAGNOSIS — F329 Major depressive disorder, single episode, unspecified: Secondary | ICD-10-CM | POA: Diagnosis present

## 2020-02-29 DIAGNOSIS — E669 Obesity, unspecified: Secondary | ICD-10-CM | POA: Diagnosis present

## 2020-02-29 DIAGNOSIS — Z8701 Personal history of pneumonia (recurrent): Secondary | ICD-10-CM

## 2020-02-29 DIAGNOSIS — F419 Anxiety disorder, unspecified: Secondary | ICD-10-CM | POA: Diagnosis present

## 2020-02-29 DIAGNOSIS — I119 Hypertensive heart disease without heart failure: Secondary | ICD-10-CM | POA: Diagnosis present

## 2020-02-29 DIAGNOSIS — Z95828 Presence of other vascular implants and grafts: Secondary | ICD-10-CM | POA: Diagnosis not present

## 2020-02-29 DIAGNOSIS — Z9013 Acquired absence of bilateral breasts and nipples: Secondary | ICD-10-CM | POA: Diagnosis not present

## 2020-02-29 DIAGNOSIS — K219 Gastro-esophageal reflux disease without esophagitis: Secondary | ICD-10-CM | POA: Diagnosis present

## 2020-02-29 DIAGNOSIS — I454 Nonspecific intraventricular block: Secondary | ICD-10-CM | POA: Diagnosis present

## 2020-02-29 DIAGNOSIS — N179 Acute kidney failure, unspecified: Secondary | ICD-10-CM | POA: Diagnosis present

## 2020-02-29 DIAGNOSIS — Z8616 Personal history of covid-19: Secondary | ICD-10-CM | POA: Diagnosis not present

## 2020-02-29 DIAGNOSIS — Z923 Personal history of irradiation: Secondary | ICD-10-CM

## 2020-02-29 DIAGNOSIS — R112 Nausea with vomiting, unspecified: Secondary | ICD-10-CM | POA: Diagnosis present

## 2020-02-29 DIAGNOSIS — G8929 Other chronic pain: Secondary | ICD-10-CM | POA: Diagnosis not present

## 2020-02-29 DIAGNOSIS — Z79891 Long term (current) use of opiate analgesic: Secondary | ICD-10-CM

## 2020-02-29 DIAGNOSIS — C7931 Secondary malignant neoplasm of brain: Secondary | ICD-10-CM | POA: Diagnosis present

## 2020-02-29 DIAGNOSIS — C50211 Malignant neoplasm of upper-inner quadrant of right female breast: Secondary | ICD-10-CM | POA: Diagnosis present

## 2020-02-29 DIAGNOSIS — Z86718 Personal history of other venous thrombosis and embolism: Secondary | ICD-10-CM | POA: Diagnosis not present

## 2020-02-29 DIAGNOSIS — Z8051 Family history of malignant neoplasm of kidney: Secondary | ICD-10-CM

## 2020-02-29 DIAGNOSIS — D649 Anemia, unspecified: Secondary | ICD-10-CM | POA: Diagnosis not present

## 2020-02-29 DIAGNOSIS — Z87891 Personal history of nicotine dependence: Secondary | ICD-10-CM

## 2020-02-29 DIAGNOSIS — T451X5A Adverse effect of antineoplastic and immunosuppressive drugs, initial encounter: Secondary | ICD-10-CM | POA: Diagnosis present

## 2020-02-29 DIAGNOSIS — G620 Drug-induced polyneuropathy: Secondary | ICD-10-CM | POA: Diagnosis present

## 2020-02-29 DIAGNOSIS — I10 Essential (primary) hypertension: Secondary | ICD-10-CM | POA: Diagnosis not present

## 2020-02-29 DIAGNOSIS — Z87440 Personal history of urinary (tract) infections: Secondary | ICD-10-CM

## 2020-02-29 DIAGNOSIS — Z170 Estrogen receptor positive status [ER+]: Secondary | ICD-10-CM | POA: Diagnosis not present

## 2020-02-29 DIAGNOSIS — R110 Nausea: Secondary | ICD-10-CM

## 2020-02-29 DIAGNOSIS — Z800 Family history of malignant neoplasm of digestive organs: Secondary | ICD-10-CM

## 2020-02-29 DIAGNOSIS — K2100 Gastro-esophageal reflux disease with esophagitis, without bleeding: Secondary | ICD-10-CM | POA: Diagnosis not present

## 2020-02-29 DIAGNOSIS — Z803 Family history of malignant neoplasm of breast: Secondary | ICD-10-CM

## 2020-02-29 LAB — URINALYSIS, ROUTINE W REFLEX MICROSCOPIC
Glucose, UA: NEGATIVE mg/dL
Hgb urine dipstick: NEGATIVE
Leukocytes,Ua: NEGATIVE
Nitrite: NEGATIVE
Specific Gravity, Urine: 1.01 (ref 1.005–1.030)
pH: 7.5 (ref 5.0–8.0)

## 2020-02-29 LAB — COMPREHENSIVE METABOLIC PANEL
ALT: 14 U/L (ref 0–44)
AST: 25 U/L (ref 15–41)
Albumin: 3.6 g/dL (ref 3.5–5.0)
Alkaline Phosphatase: 77 U/L (ref 38–126)
Anion gap: 14 (ref 5–15)
BUN: 13 mg/dL (ref 8–23)
CO2: 31 mmol/L (ref 22–32)
Calcium: 9.9 mg/dL (ref 8.9–10.3)
Chloride: 96 mmol/L — ABNORMAL LOW (ref 98–111)
Creatinine, Ser: 0.88 mg/dL (ref 0.44–1.00)
GFR calc Af Amer: >60 mL/min (ref >60)
GFR calc non Af Amer: >60 mL/min (ref >60)
Glucose, Bld: 101 mg/dL — ABNORMAL HIGH (ref 70–99)
Potassium: 2.5 mmol/L — CL (ref 3.5–5.1)
Sodium: 141 mmol/L (ref 135–145)
Total Bilirubin: 0.5 mg/dL (ref 0.3–1.2)
Total Protein: 7.5 g/dL (ref 6.5–8.1)

## 2020-02-29 LAB — CBC WITH DIFFERENTIAL/PLATELET
Abs Immature Granulocytes: 0.11 10*3/uL — ABNORMAL HIGH (ref 0.00–0.07)
Basophils Absolute: 0.1 10*3/uL (ref 0.0–0.1)
Basophils Relative: 1 %
Eosinophils Absolute: 0 10*3/uL (ref 0.0–0.5)
Eosinophils Relative: 1 %
HCT: 35.8 % — ABNORMAL LOW (ref 36.0–46.0)
Hemoglobin: 11.2 g/dL — ABNORMAL LOW (ref 12.0–15.0)
Immature Granulocytes: 2 %
Lymphocytes Relative: 15 %
Lymphs Abs: 1 10*3/uL (ref 0.7–4.0)
MCH: 28.3 pg (ref 26.0–34.0)
MCHC: 31.3 g/dL (ref 30.0–36.0)
MCV: 90.4 fL (ref 80.0–100.0)
Monocytes Absolute: 0.6 10*3/uL (ref 0.1–1.0)
Monocytes Relative: 8 %
Neutro Abs: 5.1 10*3/uL (ref 1.7–7.7)
Neutrophils Relative %: 73 %
Platelets: 378 10*3/uL (ref 150–400)
RBC: 3.96 MIL/uL (ref 3.87–5.11)
RDW: 16.4 % — ABNORMAL HIGH (ref 11.5–15.5)
WBC: 6.8 10*3/uL (ref 4.0–10.5)
nRBC: 0 % (ref 0.0–0.2)

## 2020-02-29 MED ORDER — TUCATINIB 150 MG PO TABS: 150.0000 mg | ORAL_TABLET | Freq: Every morning | ORAL | Status: DC

## 2020-02-29 MED ORDER — GABAPENTIN 300 MG PO CAPS: 300.0000 mg | ORAL_CAPSULE | Freq: Two times a day (BID) | ORAL | Status: DC | PRN

## 2020-02-29 MED ORDER — POTASSIUM CHLORIDE 10 MEQ/100ML IV SOLN
10.0000 meq | INTRAVENOUS | Status: AC
  Administered 2020-02-29 (×2): 10 meq via INTRAVENOUS
  Filled 2020-02-29 (×2): qty 100

## 2020-02-29 MED ORDER — MAGIC MOUTHWASH
10.0000 mL | Freq: Four times a day (QID) | ORAL | Status: DC | PRN
  Filled 2020-02-29: qty 10

## 2020-02-29 MED ORDER — VITAMIN D 25 MCG (1000 UNIT) PO TABS
1000.0000 [IU] | ORAL_TABLET | Freq: Every day | ORAL | Status: DC
  Administered 2020-03-01: 1000 [IU] via ORAL
  Filled 2020-02-29: qty 1

## 2020-02-29 MED ORDER — ALLOPURINOL 300 MG PO TABS
300.0000 mg | ORAL_TABLET | Freq: Every day | ORAL | Status: DC
  Administered 2020-03-01: 300 mg via ORAL
  Filled 2020-02-29: qty 1

## 2020-02-29 MED ORDER — KCL IN DEXTROSE-NACL 40-5-0.9 MEQ/L-%-% IV SOLN
INTRAVENOUS | Status: DC
  Filled 2020-02-29 (×4): qty 1000

## 2020-02-29 MED ORDER — ASCORBIC ACID 500 MG PO TABS
500.0000 mg | ORAL_TABLET | Freq: Every day | ORAL | Status: DC
  Administered 2020-03-01: 500 mg via ORAL
  Filled 2020-02-29: qty 1

## 2020-02-29 MED ORDER — SODIUM CHLORIDE 0.9% FLUSH: 10.0000 mL | INTRAVENOUS | Status: DC | PRN

## 2020-02-29 MED ORDER — ONDANSETRON HCL 4 MG PO TABS: 4.0000 mg | ORAL_TABLET | Freq: Four times a day (QID) | ORAL | Status: DC | PRN

## 2020-02-29 MED ORDER — CHLORHEXIDINE GLUCONATE CLOTH 2 % EX PADS
6.0000 | MEDICATED_PAD | Freq: Every day | CUTANEOUS | Status: DC
  Administered 2020-03-01: 6 via TOPICAL

## 2020-02-29 MED ORDER — PROCHLORPERAZINE MALEATE 10 MG PO TABS
10.0000 mg | ORAL_TABLET | Freq: Once | ORAL | Status: AC
  Administered 2020-02-29: 10 mg via ORAL
  Filled 2020-02-29: qty 1

## 2020-02-29 MED ORDER — HEPARIN SODIUM (PORCINE) 5000 UNIT/ML IJ SOLN
5000.0000 [IU] | Freq: Three times a day (TID) | INTRAMUSCULAR | Status: DC
  Administered 2020-02-29 – 2020-03-02 (×5): 5000 [IU] via SUBCUTANEOUS
  Filled 2020-02-29 (×4): qty 1

## 2020-02-29 MED ORDER — SERTRALINE HCL 100 MG PO TABS
100.0000 mg | ORAL_TABLET | Freq: Every day | ORAL | Status: DC
  Administered 2020-03-01: 100 mg via ORAL
  Filled 2020-02-29: qty 1

## 2020-02-29 MED ORDER — ONDANSETRON 4 MG PO TBDP
4.0000 mg | ORAL_TABLET | Freq: Once | ORAL | Status: AC | PRN
  Administered 2020-02-29: 4 mg via ORAL
  Filled 2020-02-29: qty 1

## 2020-02-29 MED ORDER — SODIUM CHLORIDE 0.9% FLUSH
10.0000 mL | Freq: Two times a day (BID) | INTRAVENOUS | Status: DC
  Administered 2020-03-01 (×2): 10 mL

## 2020-02-29 MED ORDER — TRIAMTERENE-HCTZ 37.5-25 MG PO TABS
1.0000 | ORAL_TABLET | Freq: Every day | ORAL | Status: DC
  Administered 2020-03-01: 1 via ORAL
  Filled 2020-02-29: qty 1

## 2020-02-29 MED ORDER — PROCHLORPERAZINE MALEATE 10 MG PO TABS: 10.0000 mg | ORAL_TABLET | Freq: Four times a day (QID) | ORAL | Status: DC | PRN

## 2020-02-29 MED ORDER — PANTOPRAZOLE SODIUM 40 MG PO TBEC
40.0000 mg | DELAYED_RELEASE_TABLET | Freq: Every day | ORAL | Status: DC
  Administered 2020-03-01: 40 mg via ORAL
  Filled 2020-02-29: qty 1

## 2020-02-29 MED ORDER — ADULT MULTIVITAMIN W/MINERALS CH
1.0000 | ORAL_TABLET | Freq: Every day | ORAL | Status: DC
  Administered 2020-03-01: 1 via ORAL
  Filled 2020-02-29: qty 1

## 2020-02-29 MED ORDER — ALPRAZOLAM 1 MG PO TABS: 1.0000 mg | ORAL_TABLET | Freq: Every evening | ORAL | Status: DC | PRN

## 2020-02-29 MED ORDER — OXYCODONE HCL 5 MG PO TABS
10.0000 mg | ORAL_TABLET | Freq: Three times a day (TID) | ORAL | Status: DC | PRN
  Administered 2020-03-01 (×2): 10 mg via ORAL
  Filled 2020-02-29 (×2): qty 2

## 2020-02-29 MED ORDER — ONDANSETRON HCL 4 MG/2ML IJ SOLN: 4.0000 mg | Freq: Four times a day (QID) | INTRAMUSCULAR | Status: DC | PRN

## 2020-02-29 MED ORDER — METHADONE HCL 10 MG PO TABS
10.0000 mg | ORAL_TABLET | Freq: Every day | ORAL | Status: DC
  Administered 2020-03-01: 10 mg via ORAL
  Filled 2020-02-29: qty 2

## 2020-02-29 MED ORDER — POTASSIUM CHLORIDE CRYS ER 20 MEQ PO TBCR
40.0000 meq | EXTENDED_RELEASE_TABLET | Freq: Once | ORAL | Status: AC
  Administered 2020-02-29: 40 meq via ORAL
  Filled 2020-02-29: qty 2

## 2020-02-29 NOTE — ED Notes (Signed)
Pt aware urine sample needed and will use call light to notify staff when she is able to provide.

## 2020-02-29 NOTE — H&P (Signed)
History and Physical   [name] [id number] DOB: July 06, 1956 DOA: 02/29/2020  Referring MD/NP/PA: Dr. Eulis Foster  PCP: [name], [name] Dad, MD   Outpatient Specialists: Dr. Jana Hakim  Patient coming from: Home  Chief Complaint: Intractable nausea vomiting  HPI: [name] is a 64 y.o. female with medical history significant of metastatic breast cancer to the brain who has been under the care of Dr. Jana Hakim and receiving chemo. Last chemotherapy 1 1/2 weeks ago. Patient was doing fine until the last 3 days when she started having vomiting with nausea.  She has had intractable nausea and vomiting since then.  Patient has been unable to keep anything down.  She was seen in the ER and evaluated.  Patient appears dehydrated and has potassium of 2.5.  She also has mild acute kidney injury.  Patient therefore being admitted to the hospital for treatment and evaluation.  She denied any sick contacts.  She had COVID-19 back in January so she is outside the infectious window.  Patient also denied any abdominal pain.  No melena no bright red blood per rectum.  She has had some constipation.  No shortness of breath..  ED Course: Vitals are stable.  White count 6.8 with hemoglobin 11.2 and platelets 378.  Sodium 141 potassium is 2.5 chloride 96 CO2 31 glucose 101.  BUN 13 creatinine 0.8.[name]  Gap is 14.  Patient appears dehydrated and has been admitted to the hospital for symptoms control on repletion of potassium.  Review of Systems: As per HPI otherwise 10 point review of systems negative.    Past Medical History:  Diagnosis Date  . Breast cancer ([place])   . DVT (deep venous thrombosis) ([place]) 10/07/2011  . Fever 02/06/2018  . Hypertension   . Radiation 11/29/2005-12/29/2005   right supraclavicular area 4147 cGy  . Radiation 06/26/02-08/14/02   right breast 5040 cGy, tumor bed boosted to 1260 cGy  . Rheumatic fever     Past Surgical History:  Procedure Laterality Date  . BREAST SURGERY    .  MASTECTOMY Bilateral   . PORT A CATH REVISION       reports that she has quit smoking. She has never used smokeless tobacco. She reports current alcohol use. She reports that she does not use drugs.  Allergies  Allergen Reactions  . Tramadol Nausea Only  . Hydrocodone-Acetaminophen Itching and Rash  . Pork-Derived Products Other (See Comments)    Pt says she just doesn't eat pork; has no reaction to pork products    Family History  Problem Relation Age of Onset  . Pancreatic cancer Mother   . Kidney cancer Sister 57  . Breast cancer Sister 14  . Breast cancer Other 33     Prior to Admission medications   Medication Sig Start Date End Date Taking? Authorizing Provider  allopurinol (ZYLOPRIM) 300 MG tablet Take 300 mg by mouth daily. 01/20/20   [provider]  ALPRAZolam Duanne Moron) 1 MG tablet Take 1 tablet (1 mg total) by mouth at bedtime as needed for sleep. 01/13/20   Truitt Merle, MD  Ascorbic Acid (VITAMIN C PO) Take 1 tablet by mouth daily.    [provider]  cholecalciferol (VITAMIN D) 1000 units tablet Take 1 tablet (1,000 Units total) by mouth daily. Patient not taking: Reported on 11/19/2019 05/16/18   [name], [name] Dad, MD  cholecalciferol (VITAMIN D3) 25 MCG (1000 UNIT) tablet Take 1,000 Units by mouth daily.    [provider]  gabapentin (NEURONTIN) 100 MG capsule  Take 1 capsule (300 mg total) by mouth 2 (two) times daily as needed. 02/18/20   [name], [name] Dad, MD  magic mouthwash SOLN Take 10 mLs by mouth 4 (four) times daily as needed for mouth pain. Swish and spit.  10/18/19   [provider]  methadone (DOLOPHINE) 10 MG tablet Take 1 tablet (10 mg total) by mouth daily. Taking once a day 02/14/20   Rai, Vernelle Emerald, MD  Multiple Vitamin (MULTIVITAMIN) capsule Take 1 capsule by mouth daily.      [provider]  naloxone [facility]) nasal spray 4 mg/0.1 mL Administer single spray of naloxone into one nostril. Additionally, spray can be  given every 2-3 minutes as needed until emergency medical assistance arrives. Call 911 immediately after use. Patient not taking: Reported on 01/15/2020 10/25/19   [name], [name] Dad, MD  omeprazole (PRILOSEC) 40 MG capsule Take 1 capsule (40 mg total) by mouth daily. 02/04/20   [name], [name] Dad, MD  ondansetron (ZOFRAN) 4 MG tablet TAKE 1 TABLET BY MOUTH THREE TIMES A DAY AS NEEDED Patient taking differently: Take 4 mg by mouth every 8 (eight) hours as needed for nausea or vomiting.  01/23/20   [name], [name] Dad, MD  Oxycodone HCl 20 MG TABS Take 0.5 tablets (10 mg total) by mouth 3 (three) times daily as needed (pain). 02/14/20   Rai, Ripudeep K, MD  polyethylene glycol (MIRALAX / GLYCOLAX) 17 g packet Take 17 g by mouth daily as needed for mild constipation. Patient not taking: Reported on 01/15/2020 11/21/19   Mercy Riding, MD  prochlorperazine (COMPAZINE) 10 MG tablet Take 1 tablet (10 mg total) by mouth every 6 (six) hours as needed for nausea or vomiting. 01/13/20   Truitt Merle, MD  sertraline (ZOLOFT) 100 MG tablet TAKE 1 TABLET BY MOUTH EVERY DAY 12/23/19   [name], [name] Dad, MD  torsemide (DEMADEX) 10 MG tablet Take 1 tablet (10 mg total) by mouth daily. 02/23/20   Robyn Haber, MD  tucatinib (TUKYSA) 150 MG tablet Take 1 tablet (150 mg total) by mouth every morning. Take as directed by MD. 12/18/19   [name], [name] Dad, MD    Physical Exam: Vitals:   02/29/20 1713 02/29/20 1738  BP: 139/69   Pulse: 66   Resp: 19   Temp: 97.6 F (36.4 C)   TempSrc: Oral   SpO2: 99%   Weight:  92.9 kg  Height:  5\' 7"  (1.702 m)      Constitutional: Chronically ill looking, Bald, Vitals:   02/29/20 1713 02/29/20 1738  BP: 139/69   Pulse: 66   Resp: 19   Temp: 97.6 F (36.4 C)   TempSrc: Oral   SpO2: 99%   Weight:  92.9 kg  Height:  5\' 7"  (1.702 m)   Eyes: PERRL, lids and conjunctivae normal ENMT: Mucous membranes are Dry Posterior pharynx clear of any exudate or lesions.Normal dentition.   Neck: normal, supple, no masses, no thyromegaly Respiratory: clear to auscultation bilaterally, no wheezing, no crackles. Normal respiratory effort. No accessory muscle use.  Cardiovascular: Regular rate and rhythm, no murmurs / rubs / gallops. No extremity edema. 2+ pedal pulses. No carotid bruits.  Abdomen: mild diffused tenderness, no masses palpated. No hepatosplenomegaly. Bowel sounds positive.  Musculoskeletal: no clubbing / cyanosis. No joint deformity upper and lower extremities. Good ROM, no contractures. Normal muscle tone.  Skin: no rashes, lesions, ulcers. No induration Neurologic: CN 2-12 grossly intact. Sensation intact, DTR normal. Strength 5/5 in all 4.  Psychiatric: Normal judgment and insight. Alert and oriented x 3. Normal mood.     Labs on Admission: I have personally reviewed following labs and imaging studies  CBC: Recent Labs  Lab 02/29/20 1901  WBC 6.8  NEUTROABS 5.1  HGB 11.2*  HCT 35.8*  MCV 90.4  PLT [id number]   Basic Metabolic Panel: Recent Labs  Lab 02/29/20 1901  NA 141  K 2.5*  CL 96*  CO2 31  GLUCOSE 101*  BUN 13  CREATININE 0.88  CALCIUM 9.9   GFR: Estimated Creatinine Clearance: 75.6 mL/min (by C-G formula based on SCr of 0.88 mg/dL). Liver Function Tests: Recent Labs  Lab 02/29/20 1901  AST 25  ALT 14  ALKPHOS 77  BILITOT 0.5  PROT 7.5  ALBUMIN 3.6   No results for input(s): LIPASE, AMYLASE in the last 168 hours. No results for input(s): AMMONIA in the last 168 hours. Coagulation Profile: No results for input(s): INR, PROTIME in the last 168 hours. Cardiac Enzymes: No results for input(s): CKTOTAL, CKMB, CKMBINDEX, TROPONINI in the last 168 hours. BNP (last 3 results) No results for input(s): PROBNP in the last 8760 hours. HbA1C: No results for input(s): HGBA1C in the last 72 hours. CBG: No results for input(s): GLUCAP in the last 168 hours. Lipid Profile: No results for input(s): CHOL, HDL, LDLCALC, TRIG, CHOLHDL,  LDLDIRECT in the last 72 hours. Thyroid Function Tests: No results for input(s): TSH, T4TOTAL, FREET4, T3FREE, THYROIDAB in the last 72 hours. Anemia Panel: No results for input(s): VITAMINB12, FOLATE, FERRITIN, TIBC, IRON, RETICCTPCT in the last 72 hours. Urine analysis:    Component Value Date/Time   COLORURINE YELLOW (A) 02/29/2020 1832   APPEARANCEUR CLEAR 02/29/2020 1832   LABSPEC 1.010 02/29/2020 1832   PHURINE 7.5 02/29/2020 1832   GLUCOSEU NEGATIVE 02/29/2020 1832   HGBUR NEGATIVE 02/29/2020 1832   BILIRUBINUR SMALL (A) 02/29/2020 1832   KETONESUR TRACE (A) 02/29/2020 1832   PROTEINUR TRACE (A) 02/29/2020 1832   UROBILINOGEN 0.2 12/29/2010 1427   NITRITE NEGATIVE 02/29/2020 1832   LEUKOCYTESUR NEGATIVE 02/29/2020 1832   Sepsis Labs: @LABRCNTIP (procalcitonin:4,lacticidven:4) )No results found for this or any previous visit (from the past 240 hour(s)).   Radiological Exams on Admission: No results found.  EKG: Independently reviewed.  Normal sinus rhythm with no significant changes  Assessment/Plan Principal Problem:   Hypokalemia Active Problems:   Essential hypertension   GERD   Malignant neoplasm of upper-inner quadrant of right breast in female, estrogen receptor positive (HCC)   Chronic pain   Nausea & vomiting     #1 severe hypokalemia: Most likely due to nausea vomiting as well as use of diuretics.  Patient used to be on potassium supplementation but has not taken it in a while.  We will admit the patient and cautiously replete her potassium.  Consider checking magnesium level.  If and when patient is better will be transitioned to oral potassium for home use.  #2 intractable nausea vomiting: Symptoms controlled.  Could be related to recent chemo or ending metastatic disease.  It could also be any viral illness.  Hydrate patient while having nausea and vomiting.  #3 metastatic breast cancer: Continue per oncology.  #4 dehydration: Aggressively hydrate  patient.  #4 GERD: Continue with PPIs  #5 chronic pain syndrome: Resume home regimen.  Nausea vomiting could be secondary to narcotics with possible gastroparesis.   DVT prophylaxis: Lovenox Code Status: Full code Family Communication: No family at bedside Disposition Plan: Home Consults called: None Admission  status: Inpatient  Severity of Illness: The appropriate patient status for this patient is INPATIENT. Inpatient status is judged to be reasonable and necessary in order to provide the required intensity of service to ensure the patient's safety. The patient's presenting symptoms, physical exam findings, and initial radiographic and laboratory data in the context of their chronic comorbidities is felt to place them at high risk for further clinical deterioration. Furthermore, it is not anticipated that the patient will be medically stable for discharge from the hospital within 2 midnights of admission. The following factors support the patient status of inpatient.   " The patient's presenting symptoms include nausea vomiting. " The worrisome physical exam findings include dry mucous membrane. " The initial radiographic and laboratory data are worrisome because of potassium 2.5. " The chronic co-morbidities include metastatic breast cancer.   * I certify that at the point of admission it is my clinical judgment that the patient will require inpatient hospital care spanning beyond 2 midnights from the point of admission due to high intensity of service, high risk for further deterioration and high frequency of surveillance required.Barbette Merino MD [facility] Pager [phone]  If 7PM-7AM, please contact night-coverage [email or web address] Password [email or web address]  02/29/2020, 9:00 PM

## 2020-02-29 NOTE — ED Provider Notes (Signed)
[facility] DEPT Provider Note   CSN: [id number] Arrival date & time: 02/29/20  1704     History Chief Complaint  Patient presents with  . Nausea  . Cancer    [name] is a 64 y.o. female.  64 y.o female with a PMH of DVT, HTN, Breast CA with mets to the brain presents to the ED with a chief complaint of emesis x 2 days. Patient reports multiple episodes of nonbilius, non bloody emesis for the past two days. Reports some lower abdominal pain especially at night. She is currently receiving chemotherapy, states she called Dr. Jana Hakim who advised she be seen in the ED for evaluation. She reports her LBM was today without any blood, does report stool is hard but she has been able to defecate. She denies any headache, weakness, shortness of breath or chest pain.   The history is provided by the patient.       Past Medical History:  Diagnosis Date  . Breast cancer ([place])   . DVT (deep venous thrombosis) ([place]) 10/07/2011  . Fever 02/06/2018  . Hypertension   . Radiation 11/29/2005-12/29/2005   right supraclavicular area 4147 cGy  . Radiation 06/26/02-08/14/02   right breast 5040 cGy, tumor bed boosted to 1260 cGy  . Rheumatic fever     Patient Active Problem List   Diagnosis Date Noted  . Nausea & vomiting 02/29/2020  . UTI (urinary tract infection) 02/11/2020  . Acute metabolic encephalopathy 123456  . Generalized weakness 02/11/2020  . Hypokalemia 02/11/2020  . Paresis of left lower extremity ([place]) 02/11/2020  . Paresis of right lower extremity ([place]) 02/11/2020  . Drug-induced polyneuropathy ([place]) 12/19/2019  . Acute respiratory failure with hypoxia ([place]) 11/19/2019  . COVID-19 virus infection 11/19/2019  . Overdose opiate, accidental or unintentional, initial encounter ([place]) 11/19/2019  . Acute encephalopathy 11/19/2019  . Metastasis to brain ([place]) 09/13/2019  . Acute lower UTI 02/06/2018  . Altered mental status 02/06/2018  .  Bacteremia 02/06/2018  . Polypharmacy 02/06/2018  . Goals of care, counseling/discussion 09/05/2017  . Pneumonia 01/25/2017  . CAP (community acquired pneumonia) 01/23/2017  . Port catheter in place 05/30/2016  . 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 Chronic pain 04/13/2015  . Left arm pain 08/18/2014  . Malignant neoplasm of upper-inner quadrant of right breast in female, estrogen receptor positive ([place]) 10/03/2013  . Post-lymphadenectomy lymphedema of arm 08/15/2013  . Port or reservoir infection 01/29/2013  . DVT (deep venous thrombosis) ([place]) 10/07/2011  . Chest pain 09/10/2009  . Secondary cardiomyopathy ([place]) 09/02/2009  . Essential hypertension 02/26/2009  . GERD 02/26/2009    Past Surgical History:  Procedure Laterality Date  . BREAST SURGERY    . MASTECTOMY Bilateral   . PORT A CATH REVISION       OB History   No obstetric history on file.     Family History  Problem Relation Age of Onset  . Pancreatic cancer Mother   . Kidney cancer Sister 37  . Breast cancer Sister 38  . Breast cancer Other 10    Social History   Tobacco Use  . Smoking status: Former Research scientist (life sciences)  . Smokeless tobacco: Never Used  Substance Use Topics  . Alcohol use: Yes    Comment: Rarely  . Drug use: No    Home Medications Prior to Admission medications   Medication Sig Start Date End Date Taking? Authorizing Provider  allopurinol (ZYLOPRIM) 300 MG tablet Take 300 mg by  mouth daily. 01/20/20   [provider]  ALPRAZolam Duanne Moron) 1 MG tablet Take 1 tablet (1 mg total) by mouth at bedtime as needed for sleep. 01/13/20   Truitt Merle, MD  Ascorbic Acid (VITAMIN C PO) Take 1 tablet by mouth daily.    [provider]  cholecalciferol (VITAMIN D) 1000 units tablet Take 1 tablet (1,000 Units total) by mouth daily. Patient not taking: Reported on 11/19/2019 05/16/18   [name], [name], MD  cholecalciferol (VITAMIN D3) 25 MCG (1000 UNIT) tablet Take 1,000  Units by mouth daily.    [provider]  gabapentin (NEURONTIN) 100 MG capsule Take 1 capsule (300 mg total) by mouth 2 (two) times daily as needed. 02/18/20   [name], [name], MD  magic mouthwash SOLN Take 10 mLs by mouth 4 (four) times daily as needed for mouth pain. Swish and spit.  10/18/19   [provider]  methadone (DOLOPHINE) 10 MG tablet Take 1 tablet (10 mg total) by mouth daily. Taking once a day 02/14/20   Rai, Vernelle Emerald, MD  Multiple Vitamin (MULTIVITAMIN) capsule Take 1 capsule by mouth daily.      [provider]  naloxone [facility]) nasal spray 4 mg/0.1 mL Administer single spray of naloxone into one nostril. Additionally, spray can be given every 2-3 minutes as needed until emergency medical assistance arrives. Call 911 immediately after use. Patient not taking: Reported on 01/15/2020 10/25/19   [name], [name], MD  omeprazole (PRILOSEC) 40 MG capsule Take 1 capsule (40 mg total) by mouth daily. 02/04/20   [name], [name], MD  ondansetron (ZOFRAN) 4 MG tablet TAKE 1 TABLET BY MOUTH THREE TIMES A DAY AS NEEDED Patient taking differently: Take 4 mg by mouth every 8 (eight) hours as needed for nausea or vomiting.  01/23/20   [name], [name], MD  Oxycodone HCl 20 MG TABS Take 0.5 tablets (10 mg total) by mouth 3 (three) times daily as needed (pain). 02/14/20   Rai, Ripudeep K, MD  polyethylene glycol (MIRALAX / GLYCOLAX) 17 g packet Take 17 g by mouth daily as needed for mild constipation. Patient not taking: Reported on 01/15/2020 11/21/19   Mercy Riding, MD  prochlorperazine (COMPAZINE) 10 MG tablet Take 1 tablet (10 mg total) by mouth every 6 (six) hours as needed for nausea or vomiting. 01/13/20   Truitt Merle, MD  sertraline (ZOLOFT) 100 MG tablet TAKE 1 TABLET BY MOUTH EVERY DAY 12/23/19   [name], [name], MD  torsemide (DEMADEX) 10 MG tablet Take 1 tablet (10 mg total) by mouth daily. 02/23/20   Robyn Haber, MD  tucatinib (TUKYSA) 150 MG tablet Take  1 tablet (150 mg total) by mouth every morning. Take as directed by MD. 12/18/19   [name], [name], MD    Allergies    Tramadol, Hydrocodone-acetaminophen, and Pork-derived products  Review of Systems   Review of Systems  Constitutional: Negative for fever.  HENT: Negative for sore throat.   Respiratory: Negative for shortness of breath.   Cardiovascular: Negative for chest pain.  Gastrointestinal: Positive for abdominal pain, nausea and vomiting.  Genitourinary: Negative for flank pain.  Musculoskeletal: Negative for back pain.  Skin: Negative for pallor and wound.  Neurological: Negative for light-headedness and headaches.    Physical Exam Updated Vital Signs BP 139/69 (BP Location: Left Arm)   Pulse 66   Temp 97.6 F (36.4 C) (Oral)   Resp 19   Ht 5\' 7"  (1.702 m)   Wt  92.9 kg   SpO2 99%   BMI 32.06 kg/m   Physical Exam Vitals and nursing note reviewed.  Constitutional:      Appearance: Normal appearance. She is not ill-appearing or toxic-appearing.  HENT:     Head: Normocephalic and atraumatic.     Mouth/Throat:     Mouth: Mucous membranes are moist.  Eyes:     Pupils: Pupils are equal, round, and reactive to light.  Cardiovascular:     Rate and Rhythm: Normal rate.  Pulmonary:     Effort: Pulmonary effort is normal.     Breath sounds: No wheezing, rhonchi or rales.  Abdominal:     General: Abdomen is flat.     Tenderness: There is abdominal tenderness. There is no right CVA tenderness, left CVA tenderness, guarding or rebound.  Musculoskeletal:     Cervical back: Normal range of motion and neck supple.  Neurological:     Mental Status: She is alert.     ED Results / Procedures / Treatments   Labs (all labs ordered are listed, but only abnormal results are displayed) Labs Reviewed  CBC WITH DIFFERENTIAL/PLATELET - Abnormal; Notable for the following components:      Result Value   Hemoglobin 11.2 (*)    HCT 35.8 (*)    RDW 16.4 (*)    Abs  Immature Granulocytes 0.11 (*)    All other components within normal limits  COMPREHENSIVE METABOLIC PANEL - Abnormal; Notable for the following components:   Potassium 2.5 (*)    Chloride 96 (*)    Glucose, Bld 101 (*)    All other components within normal limits  URINALYSIS, ROUTINE W REFLEX MICROSCOPIC - Abnormal; Notable for the following components:   Color, Urine YELLOW (*)    Bilirubin Urine SMALL (*)    Ketones, ur TRACE (*)    Protein, ur TRACE (*)    All other components within normal limits  RESPIRATORY PANEL BY RT PCR (FLU A&B, COVID)    EKG None  Radiology No results found.  Procedures Procedures (including critical care time)  Medications Ordered in ED Medications  potassium chloride 10 mEq in 100 mL IVPB (has no administration in time range)  ondansetron (ZOFRAN-ODT) disintegrating tablet 4 mg (4 mg Oral Given 02/29/20 1742)  prochlorperazine (COMPAZINE) tablet 10 mg (10 mg Oral Given 02/29/20 2048)  potassium chloride SA (KLOR-CON) CR tablet 40 mEq (40 mEq Oral Given 02/29/20 2048)    ED Course  I have reviewed the triage vital signs and the nursing notes.  Pertinent labs & imaging results that were available during my care of the patient were reviewed by me and considered in my medical decision making (see chart for details).  Clinical Course as of Feb 28 2058  Sat Feb 29, 2020  2041 Emesis x 2 days.  Potassium(!!): 2.5 [JS]    Clinical Course User Index [JS] Janeece Fitting, PA-C   MDM Rules/Calculators/A&P  Patient with a PMH of breast CA with mets to the brain presents to the ED with nausea, vomiting for the past 3 days. She was recently seen for pedal edema, was started on torsemide 10 mg daily. She also endorses lower abdominal discomfort after emesis episodes.  She is currently receiving chemotherapy.  According to her medical history is extensive review she is on Compazine 10 mg tablet.  She arrived in the ED requesting Zofran medication, which she  was previously prescribed for nausea. She reports calling Dr. Jana Hakim who recommended she be evaluated  in the ED for emesis.   During my evaluation patient is AOx3, lungs are clear to auscultation, abdomen is soft, nontender to palpation, bowel sounds are decreased however last bowel movement was yesterday, reports no blood in her stool although has noted hardening of it.  She was provided with Zofran ODT during triage, reports nausea has improved.  Hypertension of her labs with a CBC without any leukocytosis, hemoglobin is at her baseline.  CMP remarkable for hypokalemia with a potassium of 2.5, this is significantly decreased from her previous visits.  She is currently having emesis for the past 3 days, she is also currently on furosemide 10 mg tablet.  Creatinine level is within normal limits, LFTs are unremarkable.  Potassium is being replaced via IV along with oral.  UA out any nitrites or leukocytes.  Patient severe hypokalemia, will admit patient for further replacement.   8:58 PM Spoke to hospitalist service who will admit patient for further admission.   Portions of this note were generated with [employer]. Dictation errors may occur despite best attempts at proofreading.  Final Clinical Impression(s) / ED Diagnoses Final diagnoses:  Hypokalemia  Nausea    Rx / DC Orders ED Discharge Orders    None       Corinna Capra 02/29/20 2059    Daleen Bo, MD 03/01/20 1950

## 2020-02-29 NOTE — ED Triage Notes (Signed)
Per patient, she has been vomiting for 2 days, patient says pcp sent her to ED to make sure there are no tumors in her head. Patient is breast cancer positive, currently receiving chemo. Patient requesting zofran.

## 2020-02-29 NOTE — ED Notes (Signed)
Pt ambulatory to RR without assistance  

## 2020-02-29 NOTE — ED Provider Notes (Signed)
  Face-to-face evaluation   History: She presents for evaluation of nausea/vomiting, and concern for metastatic brain disease.  Patient is currently receiving chemotherapy for breast cancer.  She states that she has had vomiting for 3 days.  She has previously been off potassium, but "ran out."  Physical exam: Patient is alert calm and cooperative.  Abdomen is soft and nontender to palpation.  Lower extremities without edema.    Date: 02/29/20  Rate: 55  Rhythm: normal sinus rhythm  QRS Axis: normal  PR and QT Intervals: short PR  ST/T Wave abnormalities: normal  PR and QRS Conduction Disutrbances:none    Medical screening examination/treatment/procedure(s) were conducted as a shared visit with non-physician practitioner(s) and myself.  I personally evaluated the patient during the encounter    Daleen Bo, MD 03/01/20 1950

## 2020-03-01 ENCOUNTER — Encounter (HOSPITAL_COMMUNITY): Payer: Self-pay | Admitting: Internal Medicine

## 2020-03-01 ENCOUNTER — Other Ambulatory Visit: Payer: Self-pay

## 2020-03-01 DIAGNOSIS — R112 Nausea with vomiting, unspecified: Secondary | ICD-10-CM

## 2020-03-01 DIAGNOSIS — G8929 Other chronic pain: Secondary | ICD-10-CM

## 2020-03-01 DIAGNOSIS — C50211 Malignant neoplasm of upper-inner quadrant of right female breast: Secondary | ICD-10-CM

## 2020-03-01 DIAGNOSIS — I10 Essential (primary) hypertension: Secondary | ICD-10-CM

## 2020-03-01 DIAGNOSIS — K2100 Gastro-esophageal reflux disease with esophagitis, without bleeding: Secondary | ICD-10-CM

## 2020-03-01 DIAGNOSIS — Z170 Estrogen receptor positive status [ER+]: Secondary | ICD-10-CM

## 2020-03-01 LAB — CBC
HCT: 29.7 % — ABNORMAL LOW (ref 36.0–46.0)
Hemoglobin: 9.2 g/dL — ABNORMAL LOW (ref 12.0–15.0)
MCH: 28.3 pg (ref 26.0–34.0)
MCHC: 31 g/dL (ref 30.0–36.0)
MCV: 91.4 fL (ref 80.0–100.0)
Platelets: 337 10*3/uL (ref 150–400)
RBC: 3.25 MIL/uL — ABNORMAL LOW (ref 3.87–5.11)
RDW: 16.4 % — ABNORMAL HIGH (ref 11.5–15.5)
WBC: 6.4 10*3/uL (ref 4.0–10.5)
nRBC: 0 % (ref 0.0–0.2)

## 2020-03-01 LAB — COMPREHENSIVE METABOLIC PANEL WITH GFR
ALT: 12 U/L (ref 0–44)
AST: 21 U/L (ref 15–41)
Albumin: 3.1 g/dL — ABNORMAL LOW (ref 3.5–5.0)
Alkaline Phosphatase: 64 U/L (ref 38–126)
Anion gap: 9 (ref 5–15)
BUN: 13 mg/dL (ref 8–23)
CO2: 32 mmol/L (ref 22–32)
Calcium: 8.8 mg/dL — ABNORMAL LOW (ref 8.9–10.3)
Chloride: 101 mmol/L (ref 98–111)
Creatinine, Ser: 0.88 mg/dL (ref 0.44–1.00)
GFR calc Af Amer: >60 mL/min
GFR calc non Af Amer: >60 mL/min
Glucose, Bld: 108 mg/dL — ABNORMAL HIGH (ref 70–99)
Potassium: 3.1 mmol/L — ABNORMAL LOW (ref 3.5–5.1)
Sodium: 142 mmol/L (ref 135–145)
Total Bilirubin: 0.8 mg/dL (ref 0.3–1.2)
Total Protein: 6.3 g/dL — ABNORMAL LOW (ref 6.5–8.1)

## 2020-03-01 LAB — MAGNESIUM: Magnesium: 1.2 mg/dL — ABNORMAL LOW (ref 1.7–2.4)

## 2020-03-01 LAB — RESPIRATORY PANEL BY RT PCR (FLU A&B, COVID)
Influenza A by PCR: NEGATIVE
Influenza B by PCR: NEGATIVE
SARS Coronavirus 2 by RT PCR: NEGATIVE

## 2020-03-01 LAB — GLUCOSE, CAPILLARY: Glucose-Capillary: 97 mg/dL (ref 70–99)

## 2020-03-01 LAB — PHOSPHORUS: Phosphorus: 3.7 mg/dL (ref 2.5–4.6)

## 2020-03-01 MED ORDER — MAGNESIUM SULFATE 4 GM/100ML IV SOLN
4.0000 g | Freq: Once | INTRAVENOUS | Status: AC
  Administered 2020-03-01: 4 g via INTRAVENOUS
  Filled 2020-03-01: qty 100

## 2020-03-01 MED ORDER — POTASSIUM CHLORIDE 10 MEQ/100ML IV SOLN
10.0000 meq | INTRAVENOUS | Status: AC
  Administered 2020-03-01 (×4): 10 meq via INTRAVENOUS
  Filled 2020-03-01 (×4): qty 100

## 2020-03-01 MED ORDER — POTASSIUM CHLORIDE CRYS ER 20 MEQ PO TBCR
40.0000 meq | EXTENDED_RELEASE_TABLET | Freq: Once | ORAL | Status: AC
  Administered 2020-03-01: 40 meq via ORAL
  Filled 2020-03-01: qty 2

## 2020-03-01 NOTE — Progress Notes (Addendum)
PROGRESS NOTE    [name]  [id number] DOB: 01-10-1956 DOA: 02/29/2020 PCP: Chauncey Cruel, MD  Brief Narrative:  HPI per Dr. Gala Romney on 02/29/20 [name] is a 64 y.o. female with medical history significant of metastatic breast cancer to the brain who has been under the care of Dr. Jana Hakim and receiving chemo. Last chemotherapy 1 1/2 weeks ago. Patient was doing fine until the last 3 days when she started having vomiting with nausea.  She has had intractable nausea and vomiting since then.  Patient has been unable to keep anything down.  She was seen in the ER and evaluated.  Patient appears dehydrated and has potassium of 2.5.  She also has mild acute kidney injury.  Patient therefore being admitted to the hospital for treatment and evaluation.  She denied any sick contacts.  She had COVID-19 back in January so she is outside the infectious window.  Patient also denied any abdominal pain.  No melena no bright red blood per rectum.  She has had some constipation.  No shortness of breath..  ED Course: Vitals are stable.  White count 6.8 with hemoglobin 11.2 and platelets 378.  Sodium 141 potassium is 2.5 chloride 96 CO2 31 glucose 101.  BUN 13 creatinine 0.8.Marland Kitchen  Gap is 14.  Patient appears dehydrated and has been admitted to the hospital for symptoms control on repletion of potassium  **Interim History  She is feeling a little bit better today but still little nauseous.  Has not had any more vomiting.  Electrolytes are still on the lower side so we will continue to monitor and replete carefully.  Assessment & Plan:   Principal Problem:   Hypokalemia Active Problems:   Essential hypertension   GERD   Malignant neoplasm of upper-inner quadrant of right breast in female, estrogen receptor positive (HCC)   Chronic pain   Nausea & vomiting  Severe Hypokalemia -Presented with potassium of 2.5 - Most likely due to nausea vomiting as well as use of diuretics.  -Patient used  to be on potassium supplementation but has not taken it in a while.   -Repeat potassium level today was 3.1 -Currently getting D5/NS with 40 mEq of KCl at 100 mL's per hour -Given IV potassium chloride 20 mEq to IV yesterday as well as p.o. potassium chloride 40 mg once yesterday; will give fluid bolus once today as well as IV potassium chloride 40 mEQ today -Continue to monitor and replete as necessary  -Repeat CMP in a.m.  Hypomagnesemia -Patient's magnesium level was 1.2 -Replete with IV mag sulfate 4 g  -Continue to monitor and replete as necessary -Repeat magnesium level in a.m.  Intractable Nausea and Vomiting -Symptoms now controlled but still has some nausea.  -Could be related to recent chemo or ending metastatic disease.  It could also be any viral illness. -Currently getting IV fluid hydration with D5 normal saline +40 mill:'s of KCl at 100 mL's per hour and will continue for now -Diet has been advanced to heart healthy diet will see if she can tolerate diet  -Continue supportive care and continue with Ondansetron grams p.o./IV every 6 as needed for nausea as well as Prochlorperazine 10 mg orally every 6 hours as needed for nausea and vomiting  Metastatic Breast Cancer, ER + -Has Metastasis to the Brain  -She takes Tucatinib 150 mg po Daily every Morning  -She has had radiation and will -The patient sees Dr. Lurline Del in the outpatient setting but does not  wish to see them again and is considering changing Oncologists  Drug Induced Polyneuropathy -C/w Gabapentin 300 mg po BIDprn Pain   Dehydration -Aggressively hydrate patient as above.  GERD -Continue with PPI with Pantoprazole 40 mg po Daily   Chronic Pain Syndrome -Resume home regimen.  Nausea vomiting could be secondary to narcotics with possible gastroparesis -C/w Methadone 10 mg po Daily and Gabapentin 300 mg po BID as well as Oxycodone IR 10 mg po TIDprn -Judicious use of Pain Medication with close  monitoring   Normocytic Anemia -Patient's hemoglobin/hematocrit went from 11.2/35.8 is now 9.2/29.7 -Check anemia panel in a.m. -Continue to monitor for signs and symptoms of bleeding; currently no overt bleeding noted -Repeat CBC in a.m.  Generalized Weakness -Will get PT/OT to evaluate and Treat  Depression and Anxiety -C/w Alprazolam 1 mg p.o. nightly as needed for sleep as well as Sertaline 100 mg p.o. daily  Obesity -Estimated body mass index is 32.06 kg/m as calculated from the following:   Height as of this encounter: 5\' 7"  (1.702 m).   Weight as of this encounter: 92.9 kg. -Continued Weight Loss and Dietary Counseling   DVT prophylaxis: Heparin 5,000 units sq q8h  Code Status: FULL CODE  Family Communication: No family present at bedside  Disposition Plan: Patient is from home and will require PT OT evaluation prior to safe discharge disposition.  She will also need to have her electrolytes replete prior to discharge  Status is: Inpatient  Remains inpatient appropriate because:Unsafe d/c plan and IV treatments appropriate due to intensity of illness or inability to take PO   Dispo: The patient is from: Home              Anticipated d/c is to: Home              Anticipated d/c date is: 1 day              Patient currently is not medically stable to d/c.   Consultants:   None   Procedures: None   Antimicrobials:  Anti-infectives (From admission, onward)   None     Subjective: And examined at bedside and states that she is trying to eat a little bit but still remains nauseous.  Has not had any more vomiting.  Denies any abdominal pain.  No other concerns or complaints at this time and hopeful to go home soon.  Did not want me to speak with her primary oncologist as she is going to change her oncologist.  Objective: Vitals:   02/29/20 2236 03/01/20 0229 03/01/20 0637 03/01/20 1033  BP: (!) 160/94 (!) 152/77 124/70 98/84  Pulse: 66 64 60 65  Resp: 20 20 18  18   Temp: 98.8 F (37.1 C) 98.7 F (37.1 C) 98.2 F (36.8 C) 98.6 F (37 C)  TempSrc: Oral Oral Oral Oral  SpO2: 100% 97% 96% 94%  Weight:      Height:        Intake/Output Summary (Last 24 hours) at 03/01/2020 1249 Last data filed at 03/01/2020 0200 Gross per 24 hour  Intake 356.15 ml  Output --  Net 356.15 ml   Filed Weights   02/29/20 1738  Weight: 92.9 kg   Examination: Physical Exam:  Constitutional: WN/WD obese African-American female currently in NAD and appears calm  Eyes: Lids and conjunctivae normal, sclerae anicteric  ENMT: External Ears, Nose appear normal. Grossly normal hearing.  Neck: Appears normal, supple, no cervical masses, normal ROM, no appreciable thyromegaly; no  JVD Respiratory: Diminished to auscultation bilaterally with slightly coarse breath sounds but no wheezing, rales, rhonchi or crackles. Normal respiratory effort and patient is not tachypenic. No accessory muscle use.  Unlabored breathing Cardiovascular: RRR, no murmurs / rubs / gallops. S1 and S2 auscultated.  Minimal extremity edema Abdomen: Soft, non-tender, distended secondary to body habitus. Bowel sounds positive.  GU: Deferred. Musculoskeletal: No clubbing / cyanosis of digits/nails. No joint deformity upper and lower extremities.  Skin: No rashes, lesions, ulcers on limited skin evaluation. No induration; Warm and dry.  Neurologic: CN 2-12 grossly intact with no focal deficits. Romberg sign and cerebellar reflexes not assessed.  Psychiatric: Normal judgment and insight. Alert and oriented x 3. Normal mood and appropriate affect.   Data Reviewed: I have personally reviewed following labs and imaging studies  CBC: Recent Labs  Lab 02/29/20 1901 03/01/20 0045  WBC 6.8 6.4  NEUTROABS 5.1  --   HGB 11.2* 9.2*  HCT 35.8* 29.7*  MCV 90.4 91.4  PLT 378 [id number]   Basic Metabolic Panel: Recent Labs  Lab 02/29/20 1901 02/29/20 2330 03/01/20 0045  NA 141  --  142  K 2.5*  --  3.1*  CL  96*  --  101  CO2 31  --  32  GLUCOSE 101*  --  108*  BUN 13  --  13  CREATININE 0.88  --  0.88  CALCIUM 9.9  --  8.8*  MG  --  1.2*  --   PHOS  --   --  3.7   GFR: Estimated Creatinine Clearance: 75.6 mL/min (by C-G formula based on SCr of 0.88 mg/dL). Liver Function Tests: Recent Labs  Lab 02/29/20 1901 03/01/20 0045  AST 25 21  ALT 14 12  ALKPHOS 77 64  BILITOT 0.5 0.8  PROT 7.5 6.3*  ALBUMIN 3.6 3.1*   No results for input(s): LIPASE, AMYLASE in the last 168 hours. No results for input(s): AMMONIA in the last 168 hours. Coagulation Profile: No results for input(s): INR, PROTIME in the last 168 hours. Cardiac Enzymes: No results for input(s): CKTOTAL, CKMB, CKMBINDEX, TROPONINI in the last 168 hours. BNP (last 3 results) No results for input(s): PROBNP in the last 8760 hours. HbA1C: No results for input(s): HGBA1C in the last 72 hours. CBG: Recent Labs  Lab 03/01/20 1153  GLUCAP 97   Lipid Profile: No results for input(s): CHOL, HDL, LDLCALC, TRIG, CHOLHDL, LDLDIRECT in the last 72 hours. Thyroid Function Tests: No results for input(s): TSH, T4TOTAL, FREET4, T3FREE, THYROIDAB in the last 72 hours. Anemia Panel: No results for input(s): VITAMINB12, FOLATE, FERRITIN, TIBC, IRON, RETICCTPCT in the last 72 hours. Sepsis Labs: No results for input(s): PROCALCITON, LATICACIDVEN in the last 168 hours.  Recent Results (from the past 240 hour(s))  Respiratory Panel by RT PCR (Flu A&B, Covid) - Nasopharyngeal Swab     Status: None   Collection Time: 02/29/20  9:34 PM   Specimen: Nasopharyngeal Swab  Result Value Ref Range Status   SARS Coronavirus 2 by RT PCR NEGATIVE NEGATIVE Final    Comment: (NOTE) SARS-CoV-2 target nucleic acids are NOT DETECTED. The SARS-CoV-2 RNA is generally detectable in upper respiratoy specimens during the acute phase of infection. The lowest concentration of SARS-CoV-2 viral copies this assay can detect is 131 copies/mL. A negative  result does not preclude SARS-Cov-2 infection and should not be used as the sole basis for treatment or other patient management decisions. A negative result may occur with  improper specimen  collection/handling, submission of specimen other than nasopharyngeal swab, presence of viral mutation(s) within the areas targeted by this assay, and inadequate number of viral copies (<131 copies/mL). A negative result must be combined with clinical observations, patient history, and epidemiological information. The expected result is Negative. Fact Sheet for Patients:  PinkCheek.be Fact Sheet for Healthcare Providers:  GravelBags.it This test is not yet ap proved or cleared by the [place] FDA and  has been authorized for detection and/or diagnosis of SARS-CoV-2 by FDA under an Emergency Use Authorization (EUA). This EUA will remain  in effect (meaning this test can be used) for the duration of the COVID-19 declaration under Section 564(b)(1) of the Act, 21 U.S.C. section 360bbb-3(b)(1), unless the authorization is terminated or revoked sooner.    Influenza A by PCR NEGATIVE NEGATIVE Final   Influenza B by PCR NEGATIVE NEGATIVE Final    Comment: (NOTE) The Xpert Xpress SARS-CoV-2/FLU/RSV assay is intended as an aid in  the diagnosis of influenza from Nasopharyngeal swab specimens and  should not be used as a sole basis for treatment. Nasal washings and  aspirates are unacceptable for Xpert Xpress SARS-CoV-2/FLU/RSV  testing. Fact Sheet for Patients: PinkCheek.be Fact Sheet for Healthcare Providers: GravelBags.it This test is not yet approved or cleared by the [place] FDA and  has been authorized for detection and/or diagnosis of SARS-CoV-2 by  FDA under an Emergency Use Authorization (EUA). This EUA will remain  in effect (meaning this test can be used) for the  duration of the  Covid-19 declaration under Section 564(b)(1) of the Act, 21  U.S.C. section 360bbb-3(b)(1), unless the authorization is  terminated or revoked. Performed at [facility], [place] [address], [place], [place] [address]      RN Pressure Injury Documentation:     Estimated body mass index is 32.06 kg/m as calculated from the following:   Height as of this encounter: 5\' 7"  (1.702 m).   Weight as of this encounter: 92.9 kg.  Malnutrition Type:      Malnutrition Characteristics:      Nutrition Interventions:    Radiology Studies: No results found.  Scheduled Meds: . allopurinol  300 mg Oral Daily  . vitamin C  500 mg Oral Daily  . Chlorhexidine Gluconate Cloth  6 each Topical Daily  . cholecalciferol  1,000 Units Oral Daily  . heparin  5,000 Units Subcutaneous Q8H  . methadone  10 mg Oral Daily  . multivitamin with minerals  1 tablet Oral Daily  . pantoprazole  40 mg Oral Daily  . sertraline  100 mg Oral Daily  . sodium chloride flush  10-40 mL Intracatheter Q12H  . triamterene-hydrochlorothiazide  1 tablet Oral Daily  . tucatinib  150 mg Oral q morning - 10a   Continuous Infusions: . dextrose 5 % and 0.9 % NaCl with KCl 40 mEq/L 100 mL/hr at 03/01/20 0852    LOS: 1 day   Kerney Elbe, DO [facility] PAGER is on [place]  If 7PM-7AM, please contact night-coverage [email or web address]

## 2020-03-01 NOTE — Plan of Care (Signed)
  Problem: Health Behavior/Discharge Planning: Goal: Ability to manage health-related needs will improve Outcome: Progressing   Problem: Clinical Measurements: Goal: Will remain free from infection Outcome: Progressing   Problem: Clinical Measurements: Goal: Diagnostic test results will improve Outcome: Progressing   Problem: Nutrition: Goal: Adequate nutrition will be maintained Outcome: Progressing   Problem: Pain Managment: Goal: General experience of comfort will improve Outcome: Progressing   Problem: Safety: Goal: Ability to remain free from injury will improve Outcome: Progressing

## 2020-03-01 NOTE — Evaluation (Signed)
Physical Therapy Evaluation Patient Details Name: Daijha Leggio MRN: [id number] DOB: 05/21/1956 Today's Date: 03/01/2020   History of Present Illness  64 yo female admitted with hypokalemia, weakness. Hx of falls, met breast cancer s/p R mastectomy-on radiation, chronic pain, DVT, COVID, polyneuropathy, UTI.  Clinical Impression  On eval, pt was Min guard-Min assist for mobility. She walked a short distance around the room without a device. Pt denied need for RW for ambulation safety. She is unsteady and remains at risk for falls when mobilizing. Pt is a poor historian. She is adamant about returning home as soon as possible. Will recommend HHPT f/u. Will follow during hospital stay.     Follow Up Recommendations Home health PT;Supervision/Assistance - 24 hour    Equipment Recommendations  None recommended by PT    Recommendations for Other Services       Precautions / Restrictions Precautions Precautions: Fall Restrictions Weight Bearing Restrictions: No      Mobility  Bed Mobility Overal bed mobility: Needs Assistance Bed Mobility: Sit to Supine       Sit to supine: Supervision;HOB elevated   General bed mobility comments: for safety, lines  Transfers Overall transfer level: Needs assistance Equipment used: None Transfers: Sit to/from Stand Sit to Stand: Min guard         General transfer comment: min guard for safety.  Ambulation/Gait Ambulation/Gait assistance: Min assist Gait Distance (Feet): 25 Feet Assistive device: None Gait Pattern/deviations: Step-through pattern;Decreased stride length;Shuffle     General Gait Details: Walked around room. Pt is unsteady. Pt denies need for a RW for ambulation safety. Assist to steady. Intermittent shuffling noted.  Stairs            Wheelchair Mobility    Modified Rankin (Stroke Patients Only)       Balance Overall balance assessment: History of Falls;Needs assistance           Standing  balance-Leahy Scale: Fair                               Pertinent Vitals/Pain Pain Assessment: Faces Faces Pain Scale: Hurts a little bit Pain Location: back, neck (chronic) Pain Descriptors / Indicators: Sore;Discomfort Pain Intervention(s): Monitored during session;Limited activity within patient's tolerance    Home Living Family/patient expects to be discharged to:: Private residence Living Arrangements: Children;Spouse/significant other Available Help at Discharge: Family;Available PRN/intermittently Type of Home: House Home Access: Stairs to enter   Entrance Stairs-Number of Steps: 2 Home Layout: One level Home Equipment: Shower seat;Cane - single point;Walker - 2 wheels Additional Comments: poor historian    Prior Function Level of Independence: Independent with assistive device(s)         Comments: reports using cane for mobility PRN, completing ADLs independently     Hand Dominance        Extremity/Trunk Assessment   Upper Extremity Assessment Upper Extremity Assessment: Defer to OT evaluation    Lower Extremity Assessment Lower Extremity Assessment: Generalized weakness    Cervical / Trunk Assessment Cervical / Trunk Assessment: Normal  Communication   Communication: No difficulties  Cognition Arousal/Alertness: Awake/alert Behavior During Therapy: WFL for tasks assessed/performed Overall Cognitive Status: No family/caregiver present to determine baseline cognitive functioning                   Orientation Level: Disoriented to;Time       Safety/Judgement: Decreased awareness of safety   Problem Solving: Requires verbal cues General  Comments: some poor insight to deficits at times      General Comments      Exercises     Assessment/Plan    PT Assessment Patient needs continued PT services  PT Problem List Decreased strength;Decreased mobility;Decreased activity tolerance;Decreased balance;Decreased knowledge of use  of DME;Pain;Decreased cognition;Decreased safety awareness       PT Treatment Interventions DME instruction;Therapeutic activities;Gait training;Therapeutic exercise;Patient/family education;Balance training;Functional mobility training    PT Goals (Current goals can be found in the Care Plan section)  Acute Rehab PT Goals Patient Stated Goal: to feel better and get home  PT Goal Formulation: With patient Time For Goal Achievement: 03/15/20 Potential to Achieve Goals: Fair    Frequency Min 3X/week   Barriers to discharge Decreased caregiver support      Co-evaluation               AM-PAC PT "6 Clicks" Mobility  Outcome Measure Help needed turning from your back to your side while in a flat bed without using bedrails?: None Help needed moving from lying on your back to sitting on the side of a flat bed without using bedrails?: None Help needed moving to and from a bed to a chair (including a wheelchair)?: A Little Help needed standing up from a chair using your arms (e.g., wheelchair or bedside chair)?: A Little Help needed to walk in hospital room?: A Little Help needed climbing 3-5 steps with a railing? : A Little 6 Click Score: 20    End of Session Equipment Utilized During Treatment: Gait belt Activity Tolerance: Patient tolerated treatment well Patient left: in bed;with call bell/phone within reach;with bed alarm set   PT Visit Diagnosis: Muscle weakness (generalized) (M62.81);Difficulty in walking, not elsewhere classified (R26.2);Unsteadiness on feet (R26.81)    Time: 7124-5809 PT Time Calculation (min) (ACUTE ONLY): 8 min   Charges:   PT Evaluation $PT Eval Low Complexity: 1 Low             Maybell Misenheimer P, PT Acute Rehabilitation

## 2020-03-02 ENCOUNTER — Other Ambulatory Visit: Payer: Self-pay

## 2020-03-02 LAB — COMPREHENSIVE METABOLIC PANEL
ALT: 12 U/L (ref 0–44)
AST: 23 U/L (ref 15–41)
Albumin: 3.3 g/dL — ABNORMAL LOW (ref 3.5–5.0)
Alkaline Phosphatase: 69 U/L (ref 38–126)
Anion gap: 8 (ref 5–15)
BUN: 9 mg/dL (ref 8–23)
CO2: 26 mmol/L (ref 22–32)
Calcium: 9.1 mg/dL (ref 8.9–10.3)
Chloride: 110 mmol/L (ref 98–111)
Creatinine, Ser: 0.98 mg/dL (ref 0.44–1.00)
GFR calc Af Amer: >60 mL/min (ref >60)
GFR calc non Af Amer: >60 mL/min (ref >60)
Glucose, Bld: 113 mg/dL — ABNORMAL HIGH (ref 70–99)
Potassium: 4.6 mmol/L (ref 3.5–5.1)
Sodium: 144 mmol/L (ref 135–145)
Total Bilirubin: 0.4 mg/dL (ref 0.3–1.2)
Total Protein: 6.7 g/dL (ref 6.5–8.1)

## 2020-03-02 LAB — VITAMIN B12: Vitamin B-12: 1185 pg/mL — ABNORMAL HIGH (ref 180–914)

## 2020-03-02 LAB — CBC WITH DIFFERENTIAL/PLATELET
Abs Immature Granulocytes: 0.09 10*3/uL — ABNORMAL HIGH (ref 0.00–0.07)
Basophils Absolute: 0 10*3/uL (ref 0.0–0.1)
Basophils Relative: 1 %
Eosinophils Absolute: 0.2 10*3/uL (ref 0.0–0.5)
Eosinophils Relative: 3 %
HCT: 33.4 % — ABNORMAL LOW (ref 36.0–46.0)
Hemoglobin: 9.8 g/dL — ABNORMAL LOW (ref 12.0–15.0)
Immature Granulocytes: 1 %
Lymphocytes Relative: 13 %
Lymphs Abs: 0.8 10*3/uL (ref 0.7–4.0)
MCH: 27.9 pg (ref 26.0–34.0)
MCHC: 29.3 g/dL — ABNORMAL LOW (ref 30.0–36.0)
MCV: 95.2 fL (ref 80.0–100.0)
Monocytes Absolute: 0.8 10*3/uL (ref 0.1–1.0)
Monocytes Relative: 14 %
Neutro Abs: 4.3 10*3/uL (ref 1.7–7.7)
Neutrophils Relative %: 68 %
Platelets: 352 10*3/uL (ref 150–400)
RBC: 3.51 MIL/uL — ABNORMAL LOW (ref 3.87–5.11)
RDW: 16.8 % — ABNORMAL HIGH (ref 11.5–15.5)
WBC: 6.2 10*3/uL (ref 4.0–10.5)
nRBC: 0 % (ref 0.0–0.2)

## 2020-03-02 LAB — IRON AND TIBC
Iron: 26 ug/dL — ABNORMAL LOW (ref 28–170)
Saturation Ratios: 9 % — ABNORMAL LOW (ref 10.4–31.8)
TIBC: 291 ug/dL (ref 250–450)
UIBC: 265 ug/dL

## 2020-03-02 LAB — RETICULOCYTES
Immature Retic Fract: 36.5 % — ABNORMAL HIGH (ref 2.3–15.9)
RBC.: 3.52 MIL/uL — ABNORMAL LOW (ref 3.87–5.11)
Retic Count, Absolute: 118.6 10*3/uL (ref 19.0–186.0)
Retic Ct Pct: 3.4 % — ABNORMAL HIGH (ref 0.4–3.1)

## 2020-03-02 LAB — FOLATE: Folate: 11.8 ng/mL (ref >5.9)

## 2020-03-02 LAB — PHOSPHORUS: Phosphorus: 2.9 mg/dL (ref 2.5–4.6)

## 2020-03-02 LAB — MAGNESIUM: Magnesium: 1.8 mg/dL (ref 1.7–2.4)

## 2020-03-02 LAB — FERRITIN: Ferritin: 115 ng/mL (ref 11–307)

## 2020-03-02 MED ORDER — HEPARIN SOD (PORK) LOCK FLUSH 100 UNIT/ML IV SOLN
500.0000 [IU] | INTRAVENOUS | Status: AC | PRN
  Administered 2020-03-02: 500 [IU]
  Filled 2020-03-02: qty 5

## 2020-03-02 MED ORDER — MAGNESIUM SULFATE 2 GM/50ML IV SOLN: 2.0000 g | Freq: Once | INTRAVENOUS | Status: DC

## 2020-03-02 MED ORDER — HEPARIN SOD (PORK) LOCK FLUSH 100 UNIT/ML IV SOLN
500.0000 [IU] | Freq: Once | INTRAVENOUS | Status: DC
  Filled 2020-03-02: qty 5

## 2020-03-02 MED ORDER — ANTICOAGULANT SODIUM CITRATE 4% (200MG/5ML) IV SOLN: 5.0000 mL | Status: DC | PRN

## 2020-03-02 MED ORDER — POLYSACCHARIDE IRON COMPLEX 150 MG PO CAPS: 150.0000 mg | ORAL_CAPSULE | Freq: Every day | ORAL | 0 refills | Status: AC

## 2020-03-02 MED ORDER — POLYSACCHARIDE IRON COMPLEX 150 MG PO CAPS
150.0000 mg | ORAL_CAPSULE | Freq: Every day | ORAL | Status: DC
  Filled 2020-03-02: qty 1

## 2020-03-02 MED ORDER — ONDANSETRON HCL 4 MG PO TABS: 4.0000 mg | ORAL_TABLET | Freq: Three times a day (TID) | ORAL | 3 refills | Status: DC | PRN

## 2020-03-02 MED ORDER — PROCHLORPERAZINE MALEATE 10 MG PO TABS: 10.0000 mg | ORAL_TABLET | Freq: Four times a day (QID) | ORAL | 1 refills | Status: DC | PRN

## 2020-03-02 MED ORDER — TORSEMIDE 10 MG PO TABS
10.0000 mg | ORAL_TABLET | Freq: Every day | ORAL | Status: DC
  Filled 2020-03-02: qty 1

## 2020-03-02 MED ORDER — KLOR-CON M20 20 MEQ PO TBCR: 20.0000 meq | EXTENDED_RELEASE_TABLET | Freq: Every day | ORAL | 0 refills | Status: DC

## 2020-03-02 MED ORDER — TRIAMTERENE-HCTZ 37.5-25 MG PO CAPS: 1.0000 | ORAL_CAPSULE | Freq: Every day | ORAL | 0 refills | Status: DC

## 2020-03-02 MED ORDER — TORSEMIDE 10 MG PO TABS: 10.0000 mg | ORAL_TABLET | Freq: Every day | ORAL | 0 refills | Status: DC

## 2020-03-02 NOTE — Progress Notes (Signed)
During transfer patient from chair to bed, bottle of methadone was founded , fell off from pant's pocket. Patient reports that she didn't take medications. Medication was informed again regarding hospital policy.Medication was taking to pharmacy. Will continue to monitor.

## 2020-03-02 NOTE — Discharge Summary (Signed)
Physician Discharge Summary  [name] AJG:[id number] DOB: 07-Jan-1956 DOA: 02/29/2020  PCP: Chauncey Cruel, MD  Admit date: 02/29/2020 Discharge date: 03/02/2020  Admitted From: Home Disposition:  Home with Home Health PT/OT   Recommendations for Outpatient Follow-up:  1. Follow up with PCP in 1-2 weeks; Appointment to be made 2. Follow up with Medical Oncology within 1-2 weeks  3. Please obtain CMP/CBC Mag, Phos in one week 4. Please follow up on the following pending results:  Home Health: YES  Equipment/Devices: None recommended by PT/OT    Discharge Condition: Stable CODE STATUS: FULL CODE  Diet recommendation: Heart Healthy Carb Modified Deit   Brief/Interim Summary: HPI per Dr. Gala Romney on 02/29/20 [name] a 64 y.o.femalewith medical history significant ofmetastatic breast cancer to the brain who has been under the care of Dr. Jana Hakim and receiving chemo.Last chemotherapy 1 1/2 weeks ago.Patient was doing fine until the last 3 days when she started having vomiting with nausea. She has had intractable nausea and vomiting since then. Patient has been unable to keep anything down. She was seen in the ER and evaluated. Patient appears dehydrated and has potassium of 2.5. She also has mild acute kidney injury. Patient therefore being admitted to the hospital for treatment and evaluation. She denied any sick contacts. She had COVID-19 back in January so she is outside the infectious window. Patient also denied any abdominal pain. No melena no bright red blood per rectum. She has had some constipation. No shortness of breath..  ED Course:Vitals are stable. White count 6.8 with hemoglobin 11.2 and platelets 378. Sodium 141 potassium is 2.5 chloride 96 CO2 31 glucose 101. BUN 13 creatinine 0.8.[place] is 14. Patient appears dehydrated and has been admitted to the hospital for symptoms control on repletion of potassium  **Interim History  She is  feeling a little bit better today but still little nauseous.  Has not had any more vomiting.  Electrolytes are still on the lower side she is kept overnight in the replete.  She is improved and electrolytes are within normal limits.  She is stable from a discharge perspective and PT OT recommending home health.  She will need to follow-up and establish with a PCP and follow-up with medical oncology and she plans on changing her primary medical oncologist.  She was resumed on torsemide for discharge and will stop her triamterene hydrochlorothiazide.  She will need to repeat her blood work within 1 to 2 weeks.  Discharge Diagnoses:  Principal Problem:   Hypokalemia Active Problems:   Essential hypertension   GERD   Malignant neoplasm of upper-inner quadrant of right breast in female, estrogen receptor positive (HCC)   Chronic pain   Nausea & vomiting  Severe Hypokalemia, improved -Presented with potassium of 2.5 and now improved 4.6 -Most likely due to nausea vomiting as well as use of diuretics. -Patient used to be on potassium supplementation but has not taken it in a while.  -Currently getting D5/NS with 40 mEq of KCl at 100 mL's per hour -Given IV potassium chloride 20 mEq to IV yesterday as well as p.o. potassium chloride 40 mg once yesterday; will give fluid bolus once today as well as IV potassium chloride 40 mEQ today -Continue to monitor and replete as necessary  -Repeat CMP within 1 week  Hypomagnesemia -Patient's magnesium level was 1.2 and now improved to 1.8 -Replete with IV mag sulfate 4 g yesterday and will be given 2 g IV prior to discharge -Continue to  monitor and replete as necessary -Repeat magnesium level in a.m.  Intractable Nausea and Vomiting, improved -Symptoms now controlled but still has some nausea.  -Could be related to recent chemo or ending metastatic disease. It could also be any viral illness. -Currently getting IV fluid hydration with D5 normal saline  +40 mill:'s of KCl at 100 mL's per hour and will continue for now and stop at discharge -Diet has been advanced to heart healthy diet will see if she can tolerate diet  -Continue supportive care and continue with Ondansetron grams p.o./IV every 6 as needed for nausea as well as Prochlorperazine 10 mg orally every 6 hours as needed for nausea and vomiting -Have written her for p.o. ondansetron at discharge  Metastatic Breast Cancer, ER + -Has Metastasis to the Brain  -She takes Tucatinib 150 mg po Daily every Morning  -She has had radiation and will -The patient sees Dr. Lurline Del in the outpatient setting but does not wish to see them again and is considering changing Oncologists; this can be done in outpatient setting  Drug Induced Polyneuropathy -C/w Gabapentin 300 mg po BIDprn Pain   Dehydration -Aggressively hydrate patient as above and she is now improved.  IV fluids have now been stopped and will resume her home torsemide  GERD -Continue with PPI with Pantoprazole 40 mg po Daily   Chronic Pain Syndrome -Resume home regimen. Nausea vomiting could be secondary to narcotics with possible gastroparesis -C/w Methadone 10 mg po Daily and Gabapentin 300 mg po BID as well as Oxycodone IR 10 mg po TIDprn -Judicious use of Pain Medication with close monitoring  -follow-up with outpatient PCP as well as oncology  Normocytic Anemia -Patient's hemoglobin/hematocrit went from 11.2/35.8 is now 9.8/33.4 -Check anemia panel and showed an iron level of 26, U IBC of 265, TIBC of 291, saturation ratio 9%, ferritin level 115, folate level 11.8, vitamin B12 of 1185 -We will start her on iron polysaccharide 150 mg p.o. daily -Continue to monitor for signs and symptoms of bleeding; currently no overt bleeding noted -Repeat CBC in a.m.  Generalized Weakness -Will get PT/OT to evaluate and Treat and they recommend Home Health  Depression and Anxiety -C/w Alprazolam 1 mg p.o. nightly  as needed for sleep as well as Sertaline 100 mg p.o. daily  Obesity -Estimated body mass index is 32.06 kg/m as calculated from the following:   Height as of this encounter: '5\' 7"'  (1.702 m).   Weight as of this encounter: 92.9 kg. -Continued Weight Loss and Dietary Counseling   Lower extremity swelling -Mild and will resume home torsemide  Discharge Instructions  Discharge Instructions    Call MD for:  difficulty breathing, headache or visual disturbances   Complete by: As directed    Call MD for:  extreme fatigue   Complete by: As directed    Call MD for:  hives   Complete by: As directed    Call MD for:  persistant dizziness or light-headedness   Complete by: As directed    Call MD for:  persistant nausea and vomiting   Complete by: As directed    Call MD for:  redness, tenderness, or signs of infection (pain, swelling, redness, odor or green/yellow discharge around incision site)   Complete by: As directed    Call MD for:  severe uncontrolled pain   Complete by: As directed    Call MD for:  temperature >100.4   Complete by: As directed    Diet - low  sodium heart healthy   Complete by: As directed    Discharge instructions   Complete by: As directed    You were cared for by a hospitalist during your hospital stay. If you have any questions about your discharge medications or the care you received while you were in the hospital after you are discharged, you can call the unit and ask to speak with the hospitalist on call if the hospitalist that took care of you is not available. Once you are discharged, your primary care physician will handle any further medical issues. Please note that NO REFILLS for any discharge medications will be authorized once you are discharged, as it is imperative that you return to your primary care physician (or establish a relationship with a primary care physician if you do not have one) for your aftercare needs so that they can reassess your need for  medications and monitor your lab values.  Follow up with PCP and Medical Oncology. Repeat bloodwork within 3-7 days. Take all medications as prescribed. If symptoms change or worsen please return to the ED for evaluation   Increase activity slowly   Complete by: As directed      Allergies as of 03/02/2020      Reactions   Tramadol Nausea Only   Hydrocodone-acetaminophen Itching, Rash   Pork-derived Products Other (See Comments)   Pt says she just doesn't eat pork; has no reaction to pork products      Medication List    STOP taking these medications   triamterene-hydrochlorothiazide 37.5-25 MG capsule Commonly known as: DYAZIDE     TAKE these medications   allopurinol 300 MG tablet Commonly known as: ZYLOPRIM Take 300 mg by mouth daily.   ALPRAZolam 1 MG tablet Commonly known as: XANAX Take 1 tablet (1 mg total) by mouth at bedtime as needed for sleep.   cholecalciferol 25 MCG (1000 UNIT) tablet Commonly known as: VITAMIN D3 Take 1,000 Units by mouth daily.   cholecalciferol 1000 units tablet Commonly known as: VITAMIN D Take 1 tablet (1,000 Units total) by mouth daily.   gabapentin 300 MG capsule Commonly known as: NEURONTIN Take 300 mg by mouth 2 (two) times daily as needed (pain).   gabapentin 100 MG capsule Commonly known as: NEURONTIN Take 1 capsule (300 mg total) by mouth 2 (two) times daily as needed.   iron polysaccharides 150 MG capsule Commonly known as: NIFEREX Take 1 capsule (150 mg total) by mouth daily.   Klor-Con M20 20 MEQ tablet Generic drug: potassium chloride SA Take 1 tablet (20 mEq total) by mouth daily.   magic mouthwash Soln Take 10 mLs by mouth 4 (four) times daily as needed for mouth pain. Swish and spit.   methadone 10 MG tablet Commonly known as: DOLOPHINE Take 1 tablet (10 mg total) by mouth daily. Taking once a day   multivitamin capsule Take 1 capsule by mouth daily.   naloxone 4 MG/0.1ML Liqd nasal spray kit Commonly  known as: NARCAN Administer single spray of naloxone into one nostril. Additionally, spray can be given every 2-3 minutes as needed until emergency medical assistance arrives. Call 911 immediately after use.   omeprazole 40 MG capsule Commonly known as: PRILOSEC Take 1 capsule (40 mg total) by mouth daily.   ondansetron 4 MG tablet Commonly known as: ZOFRAN Take 1 tablet (4 mg total) by mouth 3 (three) times daily as needed. What changed:   when to take this  reasons to take this   Oxycodone  HCl 20 MG Tabs Take 0.5 tablets (10 mg total) by mouth 3 (three) times daily as needed (pain).   polyethylene glycol 17 g packet Commonly known as: MIRALAX / GLYCOLAX Take 17 g by mouth daily as needed for mild constipation.   prochlorperazine 10 MG tablet Commonly known as: COMPAZINE Take 1 tablet (10 mg total) by mouth every 6 (six) hours as needed for nausea or vomiting.   sertraline 100 MG tablet Commonly known as: ZOLOFT TAKE 1 TABLET BY MOUTH EVERY DAY   torsemide 10 MG tablet Commonly known as: Demadex Take 1 tablet (10 mg total) by mouth daily.   tucatinib 150 MG tablet Commonly known as: TUKYSA Take 150 mg by mouth every morning.   VITAMIN C PO Take 1 tablet by mouth daily.      Follow-up Information    Libby Maw, MD Follow up.   Specialty: Family Medicine Why: you have an appointment on wednesday 03/04/20 at 63 am for a hospital follow-up and to establish primary care. Contact information: [place] [address] [phone]        [facility] Follow up.   Specialty: Home Health Services Why: agency will provide home health physical and occupational therapy. Contact information: [place] [address] [phone]          Allergies  Allergen Reactions  . Tramadol Nausea Only  . Hydrocodone-Acetaminophen Itching and Rash  . Pork-Derived Products Other (See Comments)    Pt  says she just doesn't eat pork; has no reaction to pork products    Consultations: None  Procedures/Studies: DG Chest 2 View  Result Date: 02/10/2020 CLINICAL DATA:  Altered mental status. EXAM: CHEST - 2 VIEW COMPARISON:  January 25, 2020 FINDINGS: There is a well-positioned left-sided Port-A-Cath. The heart size is stable. Areas of scarring and atelectasis are noted bilaterally. There is no pneumothorax. There are degenerative changes throughout the visualized thoracic spine. IMPRESSION: No active cardiopulmonary disease. Electronically Signed   By: Constance Holster [name]   On: 02/10/2020 23:19   CT Head Wo Contrast  Result Date: 02/10/2020 CLINICAL DATA:  64 year old female with headache. History of metastatic breast cancer. EXAM: CT HEAD WITHOUT CONTRAST TECHNIQUE: Contiguous axial images were obtained from the base of the skull through the vertex without intravenous contrast. COMPARISON:  Head CT dated 01/16/2020. FINDINGS: Brain: The ventricles and sulci appropriate size for patient's age. Areas of white matter hypodensity involving the inferior right frontal and right temporal lobes similar to prior CT in keeping with metastatic disease. There is no acute intracranial hemorrhage. No mass effect or midline shift. No extra-axial fluid collection. Vascular: No hyperdense vessel or unexpected calcification. Skull: Normal. Negative for fracture or focal lesion. Sinuses/Orbits: The visualized paranasal sinuses and the right mastoid air cells are clear. Mild left mastoid effusions. No air-fluid level. Other: None IMPRESSION: 1. No acute intracranial hemorrhage. 2. Similar appearance of right frontal and temporal white matter hypodensity in keeping with metastatic disease. Electronically Signed   By: Anner Crete [name]   On: 02/10/2020 23:10   MR CERVICAL SPINE WO CONTRAST  Result Date: 02/12/2020 CLINICAL DATA:  25 old female with history of metastatic breast cancer, with new onset bilateral  lower extremity paresis. Evaluate for metastatic spinal involvement. EXAM: MRI CERVICAL AND LUMBAR SPINE WITHOUT CONTRAST TECHNIQUE: Multiplanar and multiecho pulse sequences of the cervical spine, to include the craniocervical junction and cervicothoracic junction, and lumbar spine, were obtained without intravenous  contrast. COMPARISON:  Prior PET-CT from 08/20/2019. FINDINGS: MRI CERVICAL SPINE FINDINGS Alignment: Examination moderately degraded by motion artifact. Mild straightening of the normal cervical lordosis. No listhesis or subluxation. Vertebrae: Vertebral body height maintained without evidence for acute or chronic fracture. Bone marrow signal intensity diffusely heterogeneous but within normal limits. No worrisome osseous lesions or metastatic disease identified on this motion degraded noncontrast examination. No abnormal marrow edema. Cord: Signal intensity within the cervical spinal cord grossly within normal limits allowing for motion artifact. Posterior Fossa, vertebral arteries, paraspinal tissues: Left mastoid effusion partially visualized. Visualized brain and posterior fossa within normal limits. Craniocervical junction normal. Superficial skin lesion partially seen involving the right upper back (series 9, image 38), better seen on previous thoracic spine MRI. Paraspinous and prevertebral soft tissues otherwise within normal limits. Normal intravascular flow voids seen within the vertebral arteries bilaterally. Disc levels: C2-C3: Shallow central disc protrusion mildly indents the ventral thecal sac. Protruding disc contacts the ventral spinal cord without significant stenosis or cord deformity. Superimposed mild left facet degeneration. No significant foraminal narrowing. C3-C4: Shallow broad-based central disc protrusion indents the ventral thecal sac, contacting the ventral spinal cord. Resultant mild spinal stenosis without definite cord deformity. Superimposed bilateral facet hypertrophy.  Foramina remain widely patent. C4-C5: Mild disc bulge. Flattening and partial effacement of the ventral thecal sac without significant spinal stenosis or cord deformity. Foramina remain widely patent. C5-C6: Minimal disc bulge with endplate and uncovertebral spurring. No significant spinal stenosis. Foramina remain patent. C6-C7: Mild uncovertebral hypertrophy without disc bulge. Left-sided facet degeneration. No significant spinal stenosis. Mild left C7 foraminal narrowing. No significant right foraminal stenosis. C7-T1: Negative interspace. Left greater than right facet hypertrophy. No spinal stenosis. Mild left C8 foraminal narrowing. Visualized upper thoracic spine within normal limits. MRI LUMBAR SPINE FINDINGS Segmentation: Examination mild to moderately degraded by motion artifact. Normal segmentation. Lowest well-formed disc space labeled the L5-S1 level. Alignment: Trace anterolisthesis of L4 on L5 and L5 on S1, chronic and facet mediated. Alignment otherwise normal with preservation of the normal lumbar lordosis. Vertebrae: Vertebral body height maintained without evidence for acute or chronic fracture. Bone marrow signal intensity within normal limits. No evidence for metastatic disease within the lumbar spine. No worrisome osseous lesions. Subcentimeter benign hemangioma noted within the L5 vertebral body. No abnormal marrow edema. Conus medullaris and cauda equina: Conus extends to the L1 level. Conus and cauda equina appear grossly normal allowing for motion artifact. Paraspinal and other soft tissues: Paraspinous soft tissues within normal limits. Partially visualized visceral structures are grossly unremarkable. Atherosclerotic change noted within the intra-abdominal aorta. No aneurysm. Disc levels: L1-2: Mild diffuse disc bulge with disc desiccation. Mild to moderate facet hypertrophy. Mild prominence of the dorsal epidural fat. No canal or foraminal stenosis. L2-3: Mild annular disc bulge with disc  desiccation. Mild to moderate facet hypertrophy. Mild prominence of the dorsal epidural fat. No canal or foraminal stenosis. L3-4: Mild annular disc bulge with disc desiccation, slightly asymmetric to the right. Moderate facet and ligament flavum hypertrophy. Borderline mild spinal stenosis. Mild bilateral L3 foraminal narrowing. L4-5: Trace anterolisthesis. No significant disc bulge. Moderate bilateral facet hypertrophy, slightly greater on the left. Prominence of the dorsal epidural fat. Borderline mild spinal stenosis. Mild right L4 foraminal stenosis. No significant left foraminal narrowing. L5-S1: Trace anterolisthesis. Disc desiccation. Shallow broad-based central disc protrusion closely approximates the descending S1 nerve roots without frank neural impingement or displacement. Moderate bilateral facet hypertrophy. Epidural lipomatosis. No significant canal or lateral recess stenosis. Mild  bilateral L5 foraminal narrowing. IMPRESSION: MRI CERVICAL SPINE IMPRESSION: 1. No evidence for metastatic disease within the cervical spine on this noncontrast and motion degraded exam. 2. Indeterminate superficial skin lesion involving the right upper posterior thorax, partially visualized, and better seen on prior thoracic spine MRI from 02/11/2020. Finding also described on previous PET-CT from October of 2020. 3. Mild multilevel cervical spondylosis with resultant mild spinal stenosis at C3-4, with mild left C7 and C8 foraminal narrowing. MRI LUMBAR SPINE IMPRESSION: 1. No evidence for metastatic disease within the lumbar spine on this non-contrast and motion degraded exam. 2. Shallow central disc protrusion at L5-S1, closely approximating the descending S1 nerve roots without frank neural impingement 3. Mild to moderate facet hypertrophy throughout the lumbar spine, most pronounced at L4-5 and L5-S1. Finding could contribute to underlying lower back pain. Electronically Signed   By: Jeannine Boga [name]   On:  02/12/2020 23:05   MR THORACIC SPINE WO CONTRAST  Result Date: 02/11/2020 CLINICAL DATA:  64 year old female with metastatic breast cancer. Back pain. A total spine MRI without and with contrast was planned, but the patient was only able to tolerate a noncontrast thoracic exam due to pain. EXAM: MRI THORACIC SPINE WITHOUT CONTRAST TECHNIQUE: Multiplanar, multisequence MR imaging of the thoracic spine was performed. No intravenous contrast was administered. COMPARISON:  CT cervical spine 11/19/2019. 08/20/2019 PET-CT. 05/03/2018 chest CT. FINDINGS: Limited cervical spine imaging: Straightening of cervical lordosis appears stable since January. Grossly normal bone marrow signal in the visible cervical spine. Thoracic spine segmentation:  Appears normal. Alignment:  Preserved thoracic kyphosis. No spondylolisthesis. Vertebrae: Thoracic bone marrow signal is within normal limits. No marrow edema or evidence of acute osseous abnormality. No suspicious thoracic vertebral lesion identified. However, there are questionable posterior rib lesions, such as on the right at the T6 and T8 levels series 21, images 19 and 25. Cord: Capacious thoracic spinal canal. Thoracic spinal cord and conus medullaris appear within normal limits. The conus is at T12-L1. Paraspinal and other soft tissues: Chronic superficial skin and soft tissue nodularity to the right of midline from the T1 through T8 thoracic levels (series 21, image 13 and series 17, image 7). This appears grossly stable since 2019 and may be chronic scarring. However, there was described hypermetabolism here on a 08/20/2019 PET-CT Other thoracic paraspinal soft tissues are within normal limits. There is mildly heterogeneous signal in the visible left lung including a 12 mm nodular area on series 20, image 15. Trace right pleural effusion. Negative visible upper abdominal viscera. Disc levels: Mild for age thoracic spine degeneration with no spinal stenosis or neural  foraminal stenosis. IMPRESSION: 1. No metastatic disease identified in the noncontrast thoracic spine. Mild for age thoracic spine degeneration with no spinal stenosis or neural impingement. 2. Questionable posterior rib lesions at the T6 and T8 levels. Consider follow-up Chest CT or Bone Scan. 3. Indeterminate left lung nodularity also, for which repeat chest CT would be most valuable. 4. Indeterminate superficial skin lesions over the right posterior thorax, roughly stable since 2019 but also described by PET-CT in October. Electronically Signed   By: Genevie Ann [name]   On: 02/11/2020 21:59   MR LUMBAR SPINE WO CONTRAST  Result Date: 02/12/2020 CLINICAL DATA:  55 old female with history of metastatic breast cancer, with new onset bilateral lower extremity paresis. Evaluate for metastatic spinal involvement. EXAM: MRI CERVICAL AND LUMBAR SPINE WITHOUT CONTRAST TECHNIQUE: Multiplanar and multiecho pulse sequences of the cervical spine, to include the craniocervical  junction and cervicothoracic junction, and lumbar spine, were obtained without intravenous contrast. COMPARISON:  Prior PET-CT from 08/20/2019. FINDINGS: MRI CERVICAL SPINE FINDINGS Alignment: Examination moderately degraded by motion artifact. Mild straightening of the normal cervical lordosis. No listhesis or subluxation. Vertebrae: Vertebral body height maintained without evidence for acute or chronic fracture. Bone marrow signal intensity diffusely heterogeneous but within normal limits. No worrisome osseous lesions or metastatic disease identified on this motion degraded noncontrast examination. No abnormal marrow edema. Cord: Signal intensity within the cervical spinal cord grossly within normal limits allowing for motion artifact. Posterior Fossa, vertebral arteries, paraspinal tissues: Left mastoid effusion partially visualized. Visualized brain and posterior fossa within normal limits. Craniocervical junction normal. Superficial skin  lesion partially seen involving the right upper back (series 9, image 38), better seen on previous thoracic spine MRI. Paraspinous and prevertebral soft tissues otherwise within normal limits. Normal intravascular flow voids seen within the vertebral arteries bilaterally. Disc levels: C2-C3: Shallow central disc protrusion mildly indents the ventral thecal sac. Protruding disc contacts the ventral spinal cord without significant stenosis or cord deformity. Superimposed mild left facet degeneration. No significant foraminal narrowing. C3-C4: Shallow broad-based central disc protrusion indents the ventral thecal sac, contacting the ventral spinal cord. Resultant mild spinal stenosis without definite cord deformity. Superimposed bilateral facet hypertrophy. Foramina remain widely patent. C4-C5: Mild disc bulge. Flattening and partial effacement of the ventral thecal sac without significant spinal stenosis or cord deformity. Foramina remain widely patent. C5-C6: Minimal disc bulge with endplate and uncovertebral spurring. No significant spinal stenosis. Foramina remain patent. C6-C7: Mild uncovertebral hypertrophy without disc bulge. Left-sided facet degeneration. No significant spinal stenosis. Mild left C7 foraminal narrowing. No significant right foraminal stenosis. C7-T1: Negative interspace. Left greater than right facet hypertrophy. No spinal stenosis. Mild left C8 foraminal narrowing. Visualized upper thoracic spine within normal limits. MRI LUMBAR SPINE FINDINGS Segmentation: Examination mild to moderately degraded by motion artifact. Normal segmentation. Lowest well-formed disc space labeled the L5-S1 level. Alignment: Trace anterolisthesis of L4 on L5 and L5 on S1, chronic and facet mediated. Alignment otherwise normal with preservation of the normal lumbar lordosis. Vertebrae: Vertebral body height maintained without evidence for acute or chronic fracture. Bone marrow signal intensity within normal limits. No  evidence for metastatic disease within the lumbar spine. No worrisome osseous lesions. Subcentimeter benign hemangioma noted within the L5 vertebral body. No abnormal marrow edema. Conus medullaris and cauda equina: Conus extends to the L1 level. Conus and cauda equina appear grossly normal allowing for motion artifact. Paraspinal and other soft tissues: Paraspinous soft tissues within normal limits. Partially visualized visceral structures are grossly unremarkable. Atherosclerotic change noted within the intra-abdominal aorta. No aneurysm. Disc levels: L1-2: Mild diffuse disc bulge with disc desiccation. Mild to moderate facet hypertrophy. Mild prominence of the dorsal epidural fat. No canal or foraminal stenosis. L2-3: Mild annular disc bulge with disc desiccation. Mild to moderate facet hypertrophy. Mild prominence of the dorsal epidural fat. No canal or foraminal stenosis. L3-4: Mild annular disc bulge with disc desiccation, slightly asymmetric to the right. Moderate facet and ligament flavum hypertrophy. Borderline mild spinal stenosis. Mild bilateral L3 foraminal narrowing. L4-5: Trace anterolisthesis. No significant disc bulge. Moderate bilateral facet hypertrophy, slightly greater on the left. Prominence of the dorsal epidural fat. Borderline mild spinal stenosis. Mild right L4 foraminal stenosis. No significant left foraminal narrowing. L5-S1: Trace anterolisthesis. Disc desiccation. Shallow broad-based central disc protrusion closely approximates the descending S1 nerve roots without frank neural impingement or displacement. Moderate bilateral facet  hypertrophy. Epidural lipomatosis. No significant canal or lateral recess stenosis. Mild bilateral L5 foraminal narrowing. IMPRESSION: MRI CERVICAL SPINE IMPRESSION: 1. No evidence for metastatic disease within the cervical spine on this noncontrast and motion degraded exam. 2. Indeterminate superficial skin lesion involving the right upper posterior thorax,  partially visualized, and better seen on prior thoracic spine MRI from 02/11/2020. Finding also described on previous PET-CT from October of 2020. 3. Mild multilevel cervical spondylosis with resultant mild spinal stenosis at C3-4, with mild left C7 and C8 foraminal narrowing. MRI LUMBAR SPINE IMPRESSION: 1. No evidence for metastatic disease within the lumbar spine on this non-contrast and motion degraded exam. 2. Shallow central disc protrusion at L5-S1, closely approximating the descending S1 nerve roots without frank neural impingement 3. Mild to moderate facet hypertrophy throughout the lumbar spine, most pronounced at L4-5 and L5-S1. Finding could contribute to underlying lower back pain. Electronically Signed   By: Jeannine Boga [name]   On: 02/12/2020 23:05     Subjective: And examined at bedside she is doing much better.  All her electrolytes were within normal limits.  She had no complaints and felt well and back to baseline.  No other concerns or points at this time.  She did have some mild nausea but states it was not as bad and she did not vomit.   Discharge Exam: Vitals:   03/01/20 2200 03/02/20 0540  BP: 126/76 140/72  Pulse: 60 (!) 58  Resp: 18 15  Temp: 98.7 F (37.1 C) 98.7 F (37.1 C)  SpO2: 99% 98%   Vitals:   03/01/20 1033 03/01/20 1412 03/01/20 2200 03/02/20 0540  BP: 98/84 132/69 126/76 140/72  Pulse: 65 68 60 (!) 58  Resp: '18 18 18 15  ' Temp: 98.6 F (37 C) 98.5 F (36.9 C) 98.7 F (37.1 C) 98.7 F (37.1 C)  TempSrc: Oral Oral Oral Oral  SpO2: 94% 100% 99% 98%  Weight:      Height:       General: Pt is alert, awake, not in acute distress Cardiovascular: RRR, S1/S2 +, no rubs, no gallops Respiratory: Diminished bilaterally, no wheezing, no rhonchi Abdominal: Soft, NT, distended secondary body habitus, bowel sounds + Extremities: Mild 1+ lower edema, no cyanosis  The results of significant diagnostics from this hospitalization (including imaging,  microbiology, ancillary and laboratory) are listed below for reference.    Microbiology: Recent Results (from the past 240 hour(s))  Respiratory Panel by RT PCR (Flu A&B, Covid) - Nasopharyngeal Swab     Status: None   Collection Time: 02/29/20  9:34 PM   Specimen: Nasopharyngeal Swab  Result Value Ref Range Status   SARS Coronavirus 2 by RT PCR NEGATIVE NEGATIVE Final    Comment: (NOTE) SARS-CoV-2 target nucleic acids are NOT DETECTED. The SARS-CoV-2 RNA is generally detectable in upper respiratoy specimens during the acute phase of infection. The lowest concentration of SARS-CoV-2 viral copies this assay can detect is 131 copies/mL. A negative result does not preclude SARS-Cov-2 infection and should not be used as the sole basis for treatment or other patient management decisions. A negative result may occur with  improper specimen collection/handling, submission of specimen other than nasopharyngeal swab, presence of viral mutation(s) within the areas targeted by this assay, and inadequate number of viral copies (<131 copies/mL). A negative result must be combined with clinical observations, patient history, and epidemiological information. The expected result is Negative. Fact Sheet for Patients:  PinkCheek.be Fact Sheet for Healthcare Providers:  GravelBags.it This  test is not yet ap proved or cleared by the [place] and  has been authorized for detection and/or diagnosis of SARS-CoV-2 by FDA under an Emergency Use Authorization (EUA). This EUA will remain  in effect (meaning this test can be used) for the duration of the COVID-19 declaration under Section 564(b)(1) of the Act, 21 U.S.C. section 360bbb-3(b)(1), unless the authorization is terminated or revoked sooner.    Influenza A by PCR NEGATIVE NEGATIVE Final   Influenza B by PCR NEGATIVE NEGATIVE Final    Comment: (NOTE) The Xpert Xpress  SARS-CoV-2/FLU/RSV assay is intended as an aid in  the diagnosis of influenza from Nasopharyngeal swab specimens and  should not be used as a sole basis for treatment. Nasal washings and  aspirates are unacceptable for Xpert Xpress SARS-CoV-2/FLU/RSV  testing. Fact Sheet for Patients: PinkCheek.be Fact Sheet for Healthcare Providers: GravelBags.it This test is not yet approved or cleared by the [place] FDA and  has been authorized for detection and/or diagnosis of SARS-CoV-2 by  FDA under an Emergency Use Authorization (EUA). This EUA will remain  in effect (meaning this test can be used) for the duration of the  Covid-19 declaration under Section 564(b)(1) of the Act, 21  U.S.C. section 360bbb-3(b)(1), unless the authorization is  terminated or revoked. Performed at [facility], [place] [address] [place], [place] [address]      Labs: BNP (last 3 results) Recent Labs    01/21/20 0907  BNP 32.4   Basic Metabolic Panel: Recent Labs  Lab 02/29/20 1901 02/29/20 2330 03/01/20 0045 03/02/20 0345  NA 141  --  142 144  K 2.5*  --  3.1* 4.6  CL 96*  --  101 110  CO2 31  --  32 26  GLUCOSE 101*  --  108* 113*  BUN 13  --  13 9  CREATININE 0.88  --  0.88 0.98  CALCIUM 9.9  --  8.8* 9.1  MG  --  1.2*  --  1.8  PHOS  --   --  3.7 2.9   Liver Function Tests: Recent Labs  Lab 02/29/20 1901 03/01/20 0045 03/02/20 0345  AST '25 21 23  ' ALT '14 12 12  ' ALKPHOS 77 64 69  BILITOT 0.5 0.8 0.4  PROT 7.5 6.3* 6.7  ALBUMIN 3.6 3.1* 3.3*   No results for input(s): LIPASE, AMYLASE in the last 168 hours. No results for input(s): AMMONIA in the last 168 hours. CBC: Recent Labs  Lab 02/29/20 1901 03/01/20 0045 03/02/20 0345  WBC 6.8 6.4 6.2  NEUTROABS 5.1  --  4.3  HGB 11.2* 9.2* 9.8*  HCT 35.8* 29.7* 33.4*  MCV 90.4 91.4 95.2  PLT 378 337 352   Cardiac Enzymes: No results for input(s): CKTOTAL,  CKMB, CKMBINDEX, TROPONINI in the last 168 hours. BNP: Invalid input(s): POCBNP CBG: Recent Labs  Lab 03/01/20 1153  GLUCAP 97   D-Dimer No results for input(s): DDIMER in the last 72 hours. Hgb A1c No results for input(s): HGBA1C in the last 72 hours. Lipid Profile No results for input(s): CHOL, HDL, LDLCALC, TRIG, CHOLHDL, LDLDIRECT in the last 72 hours. Thyroid function studies No results for input(s): TSH, T4TOTAL, T3FREE, THYROIDAB in the last 72 hours.  Invalid input(s): FREET3 Anemia work up Recent Labs    03/02/20 0345  VITAMINB12 1,185*  FOLATE 11.8  FERRITIN 115  TIBC 291  IRON 26*  RETICCTPCT 3.4*   Urinalysis    Component Value Date/Time  COLORURINE YELLOW (A) 02/29/2020 [place] 02/29/2020 1832   LABSPEC 1.010 02/29/2020 1832   PHURINE 7.5 02/29/2020 1832   GLUCOSEU NEGATIVE 02/29/2020 1832   HGBUR NEGATIVE 02/29/2020 1832   BILIRUBINUR SMALL (A) 02/29/2020 1832   KETONESUR TRACE (A) 02/29/2020 1832   PROTEINUR TRACE (A) 02/29/2020 1832   UROBILINOGEN 0.2 12/29/2010 1427   NITRITE NEGATIVE 02/29/2020 1832   LEUKOCYTESUR NEGATIVE 02/29/2020 1832   Sepsis Labs Invalid input(s): PROCALCITONIN,  WBC,  LACTICIDVEN Microbiology Recent Results (from the past 240 hour(s))  Respiratory Panel by RT PCR (Flu A&B, Covid) - Nasopharyngeal Swab     Status: None   Collection Time: 02/29/20  9:34 PM   Specimen: Nasopharyngeal Swab  Result Value Ref Range Status   SARS Coronavirus 2 by RT PCR NEGATIVE NEGATIVE Final    Comment: (NOTE) SARS-CoV-2 target nucleic acids are NOT DETECTED. The SARS-CoV-2 RNA is generally detectable in upper respiratoy specimens during the acute phase of infection. The lowest concentration of SARS-CoV-2 viral copies this assay can detect is 131 copies/mL. A negative result does not preclude SARS-Cov-2 infection and should not be used as the sole basis for treatment or other patient management decisions. A negative  result may occur with  improper specimen collection/handling, submission of specimen other than nasopharyngeal swab, presence of viral mutation(s) within the areas targeted by this assay, and inadequate number of viral copies (<131 copies/mL). A negative result must be combined with clinical observations, patient history, and epidemiological information. The expected result is Negative. Fact Sheet for Patients:  PinkCheek.be Fact Sheet for Healthcare Providers:  GravelBags.it This test is not yet ap proved or cleared by the [place] FDA and  has been authorized for detection and/or diagnosis of SARS-CoV-2 by FDA under an Emergency Use Authorization (EUA). This EUA will remain  in effect (meaning this test can be used) for the duration of the COVID-19 declaration under Section 564(b)(1) of the Act, 21 U.S.C. section 360bbb-3(b)(1), unless the authorization is terminated or revoked sooner.    Influenza A by PCR NEGATIVE NEGATIVE Final   Influenza B by PCR NEGATIVE NEGATIVE Final    Comment: (NOTE) The Xpert Xpress SARS-CoV-2/FLU/RSV assay is intended as an aid in  the diagnosis of influenza from Nasopharyngeal swab specimens and  should not be used as a sole basis for treatment. Nasal washings and  aspirates are unacceptable for Xpert Xpress SARS-CoV-2/FLU/RSV  testing. Fact Sheet for Patients: PinkCheek.be Fact Sheet for Healthcare Providers: GravelBags.it This test is not yet approved or cleared by the [place] FDA and  has been authorized for detection and/or diagnosis of SARS-CoV-2 by  FDA under an Emergency Use Authorization (EUA). This EUA will remain  in effect (meaning this test can be used) for the duration of the  Covid-19 declaration under Section 564(b)(1) of the Act, 21  U.S.C. section 360bbb-3(b)(1), unless the authorization is  terminated or  revoked. Performed at [facility], [place] [address] [name], [place] [address]    Time coordinating discharge: 35 minutes  SIGNED:  Kerney Elbe, DO [facility] 03/02/2020, 12:06 PM Pager is on [phone]  If 7PM-7AM, please contact night-coverage [email or web address] Password [email or web address]

## 2020-03-02 NOTE — Evaluation (Signed)
Occupational Therapy Evaluation Patient Details Name: [name] MRN: [id number] DOB: Mar 18, 1956 Today's Date: 03/02/2020    History of Present Illness 64 yo female admitted with hypokalemia, weakness. Hx of falls, met breast cancer s/p R mastectomy-on radiation, chronic pain, DVT, COVID, polyneuropathy, UTI.   Clinical Impression   Patient with listed deficits below impacting safety and independence with self care. Patient demonstrates decreased safety awareness and impulsivity during functional mobility. Pt first request to hold onto IV pole for steadying assist, then 1-2 minutes later trying to walk away from commode without IV pole and pulling on IV. Patient has limited insight to deficits which could impact her overall safety at home with ADL/IADLs such as cooking, medication management. Will continue to follow.    Follow Up Recommendations  Supervision/Assistance - 24 hour;Home health OT    Equipment Recommendations  None recommended by OT       Precautions / Restrictions Precautions Precautions: Fall Restrictions Weight Bearing Restrictions: No      Mobility Bed Mobility Overal bed mobility: Modified Independent                Transfers Overall transfer level: Needs assistance Equipment used: None Transfers: Sit to/from Stand Sit to Stand: Supervision;Min guard         General transfer comment: mild unsteadiness min guard for safety    Balance Overall balance assessment: History of Falls;Needs assistance Sitting-balance support: No upper extremity supported;Feet supported Sitting balance-Leahy Scale: Good     Standing balance support: No upper extremity supported;Single extremity supported;During functional activity Standing balance-Leahy Scale: Fair Standing balance comment: can maintain balance without external support however increased safety with unilateral/ bilateral support                           ADL either performed or assessed  with clinical judgement   ADL Overall ADL's : Needs assistance/impaired     Grooming: Supervision/safety;Standing   Upper Body Bathing: Set up;Sitting   Lower Body Bathing: Supervison/ safety;Sit to/from stand   Upper Body Dressing : Set up;Sitting   Lower Body Dressing: Supervision/safety;Sitting/lateral leans;Sit to/from stand Lower Body Dressing Details (indicate cue type and reason): pt don socks seated edge of bed Toilet Transfer: Supervision/safety;Min guard;BSC;Ambulation Toilet Transfer Details (indicate cue type and reason): cues for safety as patient turns quickly and is forgetful of IV pole Toileting- Clothing Manipulation and Hygiene: Supervision/safety;Sit to/from stand;Sitting/lateral lean       Functional mobility during ADLs: Supervision/safety General ADL Comments: patient overall supervision level for safety as patient is impulsive at times, standing or turning quickly and forgetful of IV pole. holds IV pole intermittently for balance                  Pertinent Vitals/Pain Pain Assessment: Faces Faces Pain Scale: No hurt     Hand Dominance Right   Extremity/Trunk Assessment Upper Extremity Assessment Upper Extremity Assessment: Overall WFL for tasks assessed   Lower Extremity Assessment Lower Extremity Assessment: Defer to PT evaluation   Cervical / Trunk Assessment Cervical / Trunk Assessment: Normal   Communication Communication Communication: No difficulties   Cognition Arousal/Alertness: Awake/alert Behavior During Therapy: Restless;Impulsive Overall Cognitive Status: No family/caregiver present to determine baseline cognitive functioning Area of Impairment: Orientation;Memory;Safety/judgement;Awareness;Problem solving                 Orientation Level: Disoriented to;Place   Memory: Decreased short-term memory   Safety/Judgement: Decreased awareness of safety Awareness: Emergent Problem Solving:  Requires verbal cues General  Comments: impulsive              Home Living Family/patient expects to be discharged to:: Private residence Living Arrangements: Children;Spouse/significant other Available Help at Discharge: Family;Available PRN/intermittently Type of Home: House Home Access: Stairs to enter [employer] of Steps: 2   Home Layout: One level     Bathroom Shower/Tub: Teacher, early years/pre: Standard     Home Equipment: Shower seat;Cane - single point;Walker - 2 wheels   Additional Comments: poor historian, pt stated to this OT no steps to enter      Prior Functioning/Environment Level of Independence: Independent with assistive device(s)        Comments: reports using cane for mobility PRN, completing ADLs independently        OT Problem List: Decreased cognition;Decreased safety awareness      OT Treatment/Interventions: Self-care/ADL training;DME and/or AE instruction;Energy conservation;Therapeutic activities;Cognitive remediation/compensation;Balance training;Patient/family education;Therapeutic exercise    OT Goals(Current goals can be found in the care plan section) Acute Rehab OT Goals Patient Stated Goal: to go home OT Goal Formulation: With patient Time For Goal Achievement: 03/16/20 Potential to Achieve Goals: Good  OT Frequency: Min 2X/week    AM-PAC OT "6 Clicks" Daily Activity     Outcome Measure Help from another person eating meals?: None Help from another person taking care of personal grooming?: A Little Help from another person toileting, which includes using toliet, bedpan, or urinal?: A Little Help from another person bathing (including washing, rinsing, drying)?: A Little Help from another person to put on and taking off regular upper body clothing?: A Little Help from another person to put on and taking off regular lower body clothing?: A Little 6 Click Score: 19   End of Session Nurse Communication: Mobility status  Activity  Tolerance: Patient tolerated treatment well Patient left: in chair;with call bell/phone within reach;with chair alarm set  OT Visit Diagnosis: Other abnormalities of gait and mobility (R26.89);History of falling (Z91.81)                Time: 0802-0828 OT Time Calculation (min): 26 min Charges:  OT General Charges $OT Visit: 1 Visit OT Evaluation $OT Eval Moderate Complexity: 1 Mod OT Treatments $Self Care/Home Management : 8-22 mins  Delbert [name] OT Pager: [place] 03/02/2020, 12:13 PM

## 2020-03-02 NOTE — Progress Notes (Signed)
Patient is being discharged home. Discharge instructions including medications and follow up appointments give. Home meds retrieved from pharmacy and returned to pt by charge RN Jarrett Soho), pt had no further questions at this time.

## 2020-03-02 NOTE — Progress Notes (Signed)
This shift Probation officer informed that pts room smelled of smoke and cigarette pack found laying on bed.While retrieving cigarettes this RN found 3 small pills in patients bed and pill bottle. Informed patient of hospital policy regarding smoking and  medications from home. Pt pulled out large sack from pocketbook of home meds. Pts RN to count meds and send to pharmacy. Cigarettes and lighter retrieved. Will continue to monitor

## 2020-03-02 NOTE — TOC Progression Note (Signed)
Transition of Care [facility]) - Progression Note    Patient Details  Name: [name] MRN: [id number] Date of Birth: 1956-04-29  Transition of Care [facility]) CM/SW Contact  Joaquin Courts, RN Phone Number: 03/02/2020, 10:58 AM  Clinical Narrative:    Pcp appointment placed on AVS.   Expected Discharge Plan: [place] Barriers to Discharge: No Barriers Identified  Expected Discharge Plan and Services Expected Discharge Plan: [place]   Discharge Planning Services: CM Consult Post Acute Care Choice: Resumption of Svcs/PTA Provider Living arrangements for the past 2 months: Single Family Home Expected Discharge Date: 03/02/20                         HH Arranged: PT, OT HH Agency: [place] Date [facility] Agency Contacted: 03/02/20 Time [place]: 1038 Representative spoke with at [place]: [place] ([place]) Interventions    Readmission Risk Interventions No flowsheet data found.

## 2020-03-02 NOTE — TOC Progression Note (Signed)
Transition of Care [facility]) - Progression Note    Patient Details  Name: [name] MRN: [id number] Date of Birth: 05-10-1956  Transition of Care [facility]) CM/SW Contact  Joaquin Courts, RN Phone Number: 03/02/2020, 10:38 AM  Clinical Narrative:    Patient previously active with [facility] for HHPT/OT.  Rep Tommi Rumps notified of impeding discharge.  MD please place resumption of care orders.     Expected Discharge Plan: [place] Barriers to Discharge: No Barriers Identified  Expected Discharge Plan and Services Expected Discharge Plan: [place]   Discharge Planning Services: CM Consult Post Acute Care Choice: Resumption of Svcs/PTA Provider Living arrangements for the past 2 months: Single Family Home Expected Discharge Date: 03/02/20                         HH Arranged: PT, OT HH Agency: [place] Date [place]: 03/02/20 Time [place]: 1038 Representative spoke with at [place]: [place] ([place]) Interventions    Readmission Risk Interventions No flowsheet data found.

## 2020-03-04 ENCOUNTER — Ambulatory Visit: Payer: Medicare Other | Admitting: Family Medicine

## 2020-03-05 ENCOUNTER — Other Ambulatory Visit: Payer: Self-pay | Admitting: Oncology

## 2020-03-05 NOTE — Progress Notes (Signed)
Pharmacist Chemotherapy Monitoring - Follow Up Assessment    I verify that I have reviewed each item in the below checklist:  . Regimen for the patient is scheduled for the appropriate day and plan matches scheduled date. Marland Kitchen Appropriate non-routine labs are ordered dependent on drug ordered. . If applicable, additional medications reviewed and ordered per protocol based on lifetime cumulative doses and/or treatment regimen.   Plan for follow-up and/or issues identified: Yes . I-vent associated with next due treatment: Yes . MD and/or nursing notified: Yes - MD planned monthly txs; last given 02/21/20; not due 5/5?  Kennith [place] P 03/05/2020 11:58 AM

## 2020-03-10 ENCOUNTER — Other Ambulatory Visit: Payer: Self-pay

## 2020-03-10 DIAGNOSIS — C50211 Malignant neoplasm of upper-inner quadrant of right female breast: Secondary | ICD-10-CM

## 2020-03-10 DIAGNOSIS — Z170 Estrogen receptor positive status [ER+]: Secondary | ICD-10-CM

## 2020-03-10 NOTE — Progress Notes (Signed)
[name]'s visit was rescheduled

## 2020-03-11 ENCOUNTER — Other Ambulatory Visit: Payer: Self-pay

## 2020-03-11 ENCOUNTER — Other Ambulatory Visit: Payer: Medicare Other

## 2020-03-11 ENCOUNTER — Ambulatory Visit: Payer: Medicare Other

## 2020-03-11 ENCOUNTER — Telehealth: Payer: Self-pay | Admitting: *Deleted

## 2020-03-11 ENCOUNTER — Inpatient Hospital Stay: Payer: Medicare Other

## 2020-03-11 ENCOUNTER — Telehealth: Payer: Self-pay | Admitting: Oncology

## 2020-03-11 ENCOUNTER — Other Ambulatory Visit: Payer: Medicare Other | Admitting: Hospice

## 2020-03-11 ENCOUNTER — Inpatient Hospital Stay: Payer: Medicare Other | Attending: Oncology

## 2020-03-11 ENCOUNTER — Inpatient Hospital Stay (HOSPITAL_BASED_OUTPATIENT_CLINIC_OR_DEPARTMENT_OTHER): Payer: Medicare Other | Admitting: Oncology

## 2020-03-11 DIAGNOSIS — R5383 Other fatigue: Secondary | ICD-10-CM | POA: Insufficient documentation

## 2020-03-11 DIAGNOSIS — I890 Lymphedema, not elsewhere classified: Secondary | ICD-10-CM | POA: Insufficient documentation

## 2020-03-11 DIAGNOSIS — R590 Localized enlarged lymph nodes: Secondary | ICD-10-CM | POA: Insufficient documentation

## 2020-03-11 DIAGNOSIS — Z86718 Personal history of other venous thrombosis and embolism: Secondary | ICD-10-CM | POA: Insufficient documentation

## 2020-03-11 DIAGNOSIS — Z5111 Encounter for antineoplastic chemotherapy: Secondary | ICD-10-CM | POA: Insufficient documentation

## 2020-03-11 DIAGNOSIS — Z9221 Personal history of antineoplastic chemotherapy: Secondary | ICD-10-CM | POA: Insufficient documentation

## 2020-03-11 DIAGNOSIS — Z9013 Acquired absence of bilateral breasts and nipples: Secondary | ICD-10-CM | POA: Insufficient documentation

## 2020-03-11 DIAGNOSIS — C50211 Malignant neoplasm of upper-inner quadrant of right female breast: Secondary | ICD-10-CM

## 2020-03-11 DIAGNOSIS — Z171 Estrogen receptor negative status [ER-]: Secondary | ICD-10-CM | POA: Insufficient documentation

## 2020-03-11 DIAGNOSIS — R410 Disorientation, unspecified: Secondary | ICD-10-CM | POA: Insufficient documentation

## 2020-03-11 DIAGNOSIS — C7931 Secondary malignant neoplasm of brain: Secondary | ICD-10-CM | POA: Insufficient documentation

## 2020-03-11 DIAGNOSIS — C770 Secondary and unspecified malignant neoplasm of lymph nodes of head, face and neck: Secondary | ICD-10-CM | POA: Insufficient documentation

## 2020-03-11 DIAGNOSIS — Z5112 Encounter for antineoplastic immunotherapy: Secondary | ICD-10-CM | POA: Insufficient documentation

## 2020-03-11 DIAGNOSIS — Z923 Personal history of irradiation: Secondary | ICD-10-CM | POA: Insufficient documentation

## 2020-03-11 DIAGNOSIS — Z8616 Personal history of COVID-19: Secondary | ICD-10-CM | POA: Insufficient documentation

## 2020-03-11 DIAGNOSIS — Z87891 Personal history of nicotine dependence: Secondary | ICD-10-CM | POA: Insufficient documentation

## 2020-03-11 DIAGNOSIS — Z170 Estrogen receptor positive status [ER+]: Secondary | ICD-10-CM

## 2020-03-11 DIAGNOSIS — G893 Neoplasm related pain (acute) (chronic): Secondary | ICD-10-CM | POA: Insufficient documentation

## 2020-03-11 DIAGNOSIS — C4911 Malignant neoplasm of connective and soft tissue of right upper limb, including shoulder: Secondary | ICD-10-CM

## 2020-03-11 DIAGNOSIS — Z515 Encounter for palliative care: Secondary | ICD-10-CM

## 2020-03-11 NOTE — Telephone Encounter (Signed)
VM left by the patient's daughter- Marzella Schlein who states pt is declining to come in for visit today and would like to reschedule.  Note pt's schedule needs to be revised due to previous delay and pt is not due for IV therapy until next week.  This RN informed Marzella Schlein of above as well as sent an Urgent LOS to reschedule to next week.  Per discussion with Marzella Schlein - she states no new issues - pt is having good and bad days as usual.

## 2020-03-11 NOTE — Telephone Encounter (Signed)
Scheduled apt per 5/5 sch message - unable to reach daughter Daleen Snook . Left message with appt date and time

## 2020-03-11 NOTE — Progress Notes (Signed)
[name]: [id number]  Fax: [phone]  PATIENT NAME: [name] DOB: 01-Sep-1956 MRN: [id number]  PRIMARY CARE PROVIDER:   Chauncey Cruel, MD  REFERRING PROVIDER:  Jana Hakim [name], MD [place],  [place] [id number] RESPONSIBLE PARTY:Self Contacts: [phone] POA [name] [phone]   RECOMMENDATIONS/PLAN:  Advance Care Planning/Goals of Care:Visit to build trust and follow up on palliative care.  [name] and [name] expressed their grief over patient's ongoing decline.  Ample emotional support provided.  [name] said that she believes patient has given up.  Discussions today on goals of care clarification.  [name] affirmed that patient is a DO NOT RESUSCITATE.  MOST form given for deliberation within the family and for further discussion with NP in meeting scheduled 03/26/2020.  Family encouraged to keep the DNR form in a visible place in the house and to take it along if patient needs to go to the hospital.  Goals of care include to maximize quality of life and symptom management. Symptom management  Edema in BLE persists with some improvement.  Patient was seen in the ED 02/23/2020 for bil pedal edema +3.  She was started on torsemide.  Edema is now 1+.  Patient denies coughing, shortness of breath.  Patient with ongoing confusion and memory loss likely related to brain mets.  [name] and [name] reported that patient refused to go for chemotherapy today and that she is not consistent in going for treatments.  Next chemo is scheduled for 03/20/2020.  [name] is of the impression that patient is no longer willing to continue aggressive treatment of her cancer.   Also, patient is not consistent in taking [place].  She has not taken it in the last 2 days. Education provided on the need to be consistency in going for chemo if that is the choosing course of treatment.  She also  reported that last MRI of the brain showed the tumor was shrinking and patient is due for another MRI 1st April 16, 2020. 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 Naloxone at home. She goes to a pain clinic.Her daughters have taken over management of her medications because of patient's confusion since her CA mets to her brain. Patient is alert today, woozy and unsteady on her feet.  Daughters think that this may be related to patient being able to get some pain medication from another source outside their control.  Education on the harms of such unsupervised pain medications; encouraged them to continue to be in full charge of all her medications and give to her as at when due and or as needed.  They verbalized understanding and committed to doing same.  Patient denied taking any medication not giving to her by her daughters. NP encouraged to make sure patient is taking her potassium since she is on a diuretic. Use of rolling walker emphasized to help prevent a fall.  Falls precautions discussed.  Patient denied nausea or vomiting and said that she feels a lot better today.   She continues on wearing her briefs for incontinence.  Patient in no medical acuity, denies pain and discomfort at this time.  Encouraged ongoing care and to call with concerns. Follow [name] care will continue to follow patient for goals of care clarification and symptom management. I spent1 hour and 46 minutes providing this consultation;  time includes chart review and documentation. More  than 50% of the time in this consultation was spent on coordinating communication  HISTORY OF PRESENT ILLNESS:[name] Foxis a 64 y.o.year oldfemalewith multiple medical problems includingStage IV estrogen receptor negative Breast Cancer, with brain mets, angiosarcoma, Gout.Palliative Care was asked to help address goals of care.     CODE STATUS: DNR  PPS: weak 50% HOSPICE ELIGIBILITY/DIAGNOSIS: TBD  PAST MEDICAL HISTORY:  Past Medical History:  Diagnosis Date  . Breast cancer ([place])   . DVT (deep venous thrombosis) ([place]) 10/07/2011  . Fever 02/06/2018  . Hypertension   . Radiation 11/29/2005-12/29/2005   right supraclavicular area 4147 cGy  . Radiation 06/26/02-08/14/02   right breast 5040 cGy, tumor bed boosted to 1260 cGy  . Rheumatic fever     SOCIAL HX:  Social History   Tobacco Use  . Smoking status: Former Research scientist (life sciences)  . Smokeless tobacco: Never Used  Substance Use Topics  . Alcohol use: Yes    Comment: Rarely    ALLERGIES:  Allergies  Allergen Reactions  . Tramadol Nausea Only  . Hydrocodone-Acetaminophen Itching and Rash  . Pork-Derived Products Other (See Comments)    Pt says she just doesn't eat pork; has no reaction to pork products     PERTINENT MEDICATIONS:  Outpatient Encounter Medications as of 03/11/2020  Medication Sig  . allopurinol (ZYLOPRIM) 300 MG tablet Take 300 mg by mouth daily.  Marland Kitchen ALPRAZolam (XANAX) 1 MG tablet Take 1 tablet (1 mg total) by mouth at bedtime as needed for sleep.  . Ascorbic Acid (VITAMIN C PO) Take 1 tablet by mouth daily.  . cholecalciferol (VITAMIN D) 1000 units tablet Take 1 tablet (1,000 Units total) by mouth daily. (Patient not taking: Reported on 11/19/2019)  . cholecalciferol (VITAMIN D3) 25 MCG (1000 UNIT) tablet Take 1,000 Units by mouth daily.  Marland Kitchen gabapentin (NEURONTIN) 100 MG capsule Take 1 capsule (300 mg total) by mouth 2 (two) times daily as needed. (Patient not taking: Reported on 02/29/2020)  . gabapentin (NEURONTIN) 300 MG capsule Take 300 mg by mouth 2 (two) times daily as needed (pain).  . iron polysaccharides (NIFEREX) 150 MG capsule Take 1 capsule (150 mg total) by mouth daily.  Marland Kitchen KLOR-CON M20 20 MEQ tablet Take 1 tablet (20 mEq total) by mouth daily.  . magic mouthwash SOLN Take 10 mLs by mouth 4 (four) times daily as needed for mouth pain.  Swish and spit.   . methadone (DOLOPHINE) 10 MG tablet Take 1 tablet (10 mg total) by mouth daily. Taking once a day  . Multiple Vitamin (MULTIVITAMIN) capsule Take 1 capsule by mouth daily.    . naloxone (NARCAN) nasal spray 4 mg/0.1 mL Administer single spray of naloxone into one nostril. Additionally, spray can be given every 2-3 minutes as needed until emergency medical assistance arrives. Call 911 immediately after use. (Patient not taking: Reported on 01/15/2020)  . omeprazole (PRILOSEC) 40 MG capsule Take 1 capsule (40 mg total) by mouth daily.  . ondansetron (ZOFRAN) 4 MG tablet Take 1 tablet (4 mg total) by mouth 3 (three) times daily as needed.  . Oxycodone HCl 20 MG TABS Take 0.5 tablets (10 mg total) by mouth 3 (three) times daily as needed (pain).  . polyethylene glycol (MIRALAX / GLYCOLAX) 17 g packet Take 17 g by mouth daily as needed for mild constipation. (Patient not taking: Reported on 01/15/2020)  . prochlorperazine (COMPAZINE) 10 MG tablet Take 1 tablet (10 mg total) by mouth every 6 (six) hours as needed for nausea or vomiting.  Marland Kitchen  sertraline (ZOLOFT) 100 MG tablet TAKE 1 TABLET BY MOUTH EVERY DAY  . torsemide (DEMADEX) 10 MG tablet Take 1 tablet (10 mg total) by mouth daily.  . tucatinib (TUKYSA) 150 MG tablet Take 150 mg by mouth every morning.    Facility-Administered Encounter Medications as of 03/11/2020  Medication  . sodium chloride 0.9 % injection 10 mL  . sodium chloride flush (NS) 0.9 % injection 10 mL    PHYSICAL EXAM/ROS:  General: NAD, unsteady gait Cardiovascular: regular rate and rhythm; denies chest pain Pulmonary: clear ant/post fields; normal respiratory effort Abdomen: soft, nontender, + bowel sounds GU: no suprapubic tenderness Extremities: 1+ pitting pedal edema Skin: no rashes to exposed skin Neurological: Weakness but otherwise nonfocal; confusion and memory loss  Teodoro Spray, NP

## 2020-03-16 ENCOUNTER — Ambulatory Visit (INDEPENDENT_AMBULATORY_CARE_PROVIDER_SITE_OTHER): Payer: Medicare Other | Admitting: Family Medicine

## 2020-03-16 ENCOUNTER — Encounter: Payer: Self-pay | Admitting: Family Medicine

## 2020-03-16 ENCOUNTER — Other Ambulatory Visit: Payer: Self-pay

## 2020-03-16 VITALS — BP 100/62 | HR 82 | Temp 97.5°F | Ht 64.0 in | Wt 201.6 lb

## 2020-03-16 DIAGNOSIS — G629 Polyneuropathy, unspecified: Secondary | ICD-10-CM | POA: Diagnosis not present

## 2020-03-16 DIAGNOSIS — F418 Other specified anxiety disorders: Secondary | ICD-10-CM | POA: Diagnosis not present

## 2020-03-16 DIAGNOSIS — C7931 Secondary malignant neoplasm of brain: Secondary | ICD-10-CM | POA: Diagnosis not present

## 2020-03-16 DIAGNOSIS — C50911 Malignant neoplasm of unspecified site of right female breast: Secondary | ICD-10-CM | POA: Diagnosis not present

## 2020-03-16 MED ORDER — METHADONE HCL 10 MG PO TABS: 10.0000 mg | ORAL_TABLET | Freq: Every day | ORAL | 0 refills | Status: AC

## 2020-03-16 MED ORDER — SERTRALINE HCL 100 MG PO TABS: 100.0000 mg | ORAL_TABLET | Freq: Every day | ORAL | 2 refills | Status: DC

## 2020-03-16 MED ORDER — OXYCODONE HCL 20 MG PO TABS: 10.0000 mg | ORAL_TABLET | Freq: Three times a day (TID) | ORAL | 0 refills | Status: AC | PRN

## 2020-03-16 NOTE — Progress Notes (Signed)
New Patient Office Visit  Subjective:  Patient ID: [name], female    DOB: 10/23/1956  Age: 64 y.o. MRN: [id number]  CC:  Chief Complaint  Patient presents with  . Establish Care    patient would like feet checked painful to walk, per pt possible lymphedema. Stomach and back pains.     HPI [name] presents for refills on her pain medication and anxiety medication.  Significant past medical history of right breast cancer with metastasis to the brain and elsewhere in her body.  Ongoing history of anxiety with depression.  She is out of her medication for pain and will be seen by her cancer doctor until Friday.  CBC with CMP are scheduled to be drawn at that time for her.   Past Medical History:  Diagnosis Date  . Breast cancer ([place])   . DVT (deep venous thrombosis) ([place]) 10/07/2011  . Fever 02/06/2018  . Hypertension   . Radiation 11/29/2005-12/29/2005   right supraclavicular area 4147 cGy  . Radiation 06/26/02-08/14/02   right breast 5040 cGy, tumor bed boosted to 1260 cGy  . Rheumatic fever     Past Surgical History:  Procedure Laterality Date  . BREAST SURGERY    . MASTECTOMY Bilateral   . PORT A CATH REVISION      Family History  Problem Relation Age of Onset  . Pancreatic cancer Mother   . Kidney cancer Sister 48  . Breast cancer Sister 65  . Breast cancer Other 70    Social History   Socioeconomic History  . Marital status: Single    Spouse name: Not on file  . Number of children: Not on file  . Years of education: Not on file  . Highest education level: Not on file  Occupational History  . Not on file  Tobacco Use  . Smoking status: Former Research scientist (life sciences)  . Smokeless tobacco: Never Used  Substance and Sexual Activity  . Alcohol use: Never    Comment: Rarely  . Drug use: No  . Sexual activity: Not on file  Other Topics Concern  . Not on file  Social History Narrative  . Not on file   Social Determinants of Health   Financial Resource Strain:   .  Difficulty of Paying Living Expenses:   Food Insecurity:   . Worried About Charity fundraiser in the Last Year:   . Arboriculturist in the Last Year:   Transportation Needs: Unmet Transportation Needs  . Lack of Transportation (Medical): Yes  . Lack of Transportation (Non-Medical): Yes  Physical Activity:   . Days of Exercise per Week:   . Minutes of Exercise per Session:   Stress:   . Feeling of Stress :   Social Connections:   . Frequency of Communication with Friends and Family:   . Frequency of Social Gatherings with Friends and Family:   . Attends Religious Services:   . Active Member of Clubs or Organizations:   . Attends Archivist Meetings:   Marland Kitchen Marital Status:   Intimate Partner Violence:   . Fear of Current or Ex-Partner:   . Emotionally Abused:   Marland Kitchen Physically Abused:   . Sexually Abused:     ROS Review of Systems  Constitutional: Negative.   Respiratory: Negative.   Cardiovascular: Negative.   Gastrointestinal: Negative.   Musculoskeletal: Negative for back pain.  Skin: Positive for color change and rash.  Neurological: Positive for numbness. Negative for speech difficulty.  Psychiatric/Behavioral:  Positive for dysphoric mood. The patient is nervous/anxious.     Objective:   Today's Vitals: BP 100/62   Pulse 82   Temp (!) 97.5 F (36.4 C) (Tympanic)   Ht 5\' 4"  (1.626 m)   Wt 201 lb 9.6 oz (91.4 kg)   SpO2 94%   BMI 34.60 kg/m   Physical Exam Vitals reviewed.  Constitutional:      General: She is not in acute distress.    Appearance: Normal appearance. She is not ill-appearing or toxic-appearing.  HENT:     Head: Normocephalic and atraumatic.     Right Ear: External ear normal.     Left Ear: External ear normal.  Eyes:     General: No scleral icterus.       Right eye: No discharge.        Left eye: No discharge.     Conjunctiva/sclera: Conjunctivae normal.  Cardiovascular:     Rate and Rhythm: Normal rate and regular rhythm.    Pulmonary:     Effort: Pulmonary effort is normal.     Breath sounds: Normal breath sounds.  Musculoskeletal:     Cervical back: No tenderness.     Right lower leg: Edema present.     Left lower leg: No edema.  Lymphadenopathy:     Cervical: No cervical adenopathy.  Neurological:     Mental Status: She is alert and oriented to person, place, and time.  Psychiatric:        Mood and Affect: Mood normal.        Behavior: Behavior normal.     Assessment & Plan:   Problem List Items Addressed This Visit      Nervous and Auditory   Cancer of right breast metastatic to brain [facility]) - Primary   Relevant Medications   methadone (DOLOPHINE) 10 MG tablet   Oxycodone HCl 20 MG TABS    Other Visit Diagnoses    Depression with anxiety       Relevant Medications   sertraline (ZOLOFT) 100 MG tablet   Polyneuropathy       Relevant Medications   sertraline (ZOLOFT) 100 MG tablet      Outpatient Encounter Medications as of 03/16/2020  Medication Sig  . allopurinol (ZYLOPRIM) 300 MG tablet Take 300 mg by mouth daily.  Marland Kitchen ALPRAZolam (XANAX) 1 MG tablet Take 1 tablet (1 mg total) by mouth at bedtime as needed for sleep.  . Ascorbic Acid (VITAMIN C PO) Take 1 tablet by mouth daily.  . cholecalciferol (VITAMIN D3) 25 MCG (1000 UNIT) tablet Take 1,000 Units by mouth daily.  Marland Kitchen gabapentin (NEURONTIN) 100 MG capsule Take 1 capsule (300 mg total) by mouth 2 (two) times daily as needed.  . iron polysaccharides (NIFEREX) 150 MG capsule Take 1 capsule (150 mg total) by mouth daily.  . magic mouthwash SOLN Take 10 mLs by mouth 4 (four) times daily as needed for mouth pain. Swish and spit.   . Multiple Vitamin (MULTIVITAMIN) capsule Take 1 capsule by mouth daily.    Marland Kitchen omeprazole (PRILOSEC) 40 MG capsule Take 1 capsule (40 mg total) by mouth daily.  . ondansetron (ZOFRAN) 4 MG tablet Take 1 tablet (4 mg total) by mouth 3 (three) times daily as needed.  . Oxycodone HCl 20 MG TABS Take 0.5 tablets (10 mg  total) by mouth 3 (three) times daily as needed for up to 5 days (pain).  . tucatinib (TUKYSA) 150 MG tablet Take 150 mg by mouth every morning.   . [  DISCONTINUED] Oxycodone HCl 20 MG TABS Take 0.5 tablets (10 mg total) by mouth 3 (three) times daily as needed (pain).  . cholecalciferol (VITAMIN D) 1000 units tablet Take 1 tablet (1,000 Units total) by mouth daily. (Patient not taking: Reported on 11/19/2019)  . gabapentin (NEURONTIN) 300 MG capsule Take 300 mg by mouth 2 (two) times daily as needed (pain).  Marland Kitchen KLOR-CON M20 20 MEQ tablet Take 1 tablet (20 mEq total) by mouth daily.  . methadone (DOLOPHINE) 10 MG tablet Take 1 tablet (10 mg total) by mouth daily for 5 days. Taking once a day  . naloxone (NARCAN) nasal spray 4 mg/0.1 mL Administer single spray of naloxone into one nostril. Additionally, spray can be given every 2-3 minutes as needed until emergency medical assistance arrives. Call 911 immediately after use. (Patient not taking: Reported on 01/15/2020)  . polyethylene glycol (MIRALAX / GLYCOLAX) 17 g packet Take 17 g by mouth daily as needed for mild constipation. (Patient not taking: Reported on 01/15/2020)  . prochlorperazine (COMPAZINE) 10 MG tablet Take 1 tablet (10 mg total) by mouth every 6 (six) hours as needed for nausea or vomiting. (Patient not taking: Reported on 03/16/2020)  . sertraline (ZOLOFT) 100 MG tablet Take 1 tablet (100 mg total) by mouth daily.  Marland Kitchen torsemide (DEMADEX) 10 MG tablet Take 1 tablet (10 mg total) by mouth daily.  . [DISCONTINUED] methadone (DOLOPHINE) 10 MG tablet Take 1 tablet (10 mg total) by mouth daily. Taking once a day (Patient not taking: Reported on 03/16/2020)  . [DISCONTINUED] sertraline (ZOLOFT) 100 MG tablet TAKE 1 TABLET BY MOUTH EVERY DAY (Patient not taking: Reported on 03/16/2020)   Facility-Administered Encounter Medications as of 03/16/2020  Medication  . sodium chloride 0.9 % injection 10 mL  . sodium chloride flush (NS) 0.9 % injection 10  mL    Follow-up: Return in about 1 month (around 04/16/2020), or Must follow up with oncology for further pain medication..   Follow-up of cancer center for your treatment and visit with Dr. Sarajane Jews.  We will need to see him for further refills on pain medications and anxiety treatment.  With patient's fall history would advise against mixing benzodiazepine with strong narcotic medications.  She will have suggested blood work drawn at that time as well.  Libby Maw, MD

## 2020-03-16 NOTE — Progress Notes (Signed)
Pharmacist Chemotherapy Monitoring - Follow Up Assessment    I verify that I have reviewed each item in the below checklist:  . Regimen for the patient is scheduled for the appropriate day and plan matches scheduled date. Marland Kitchen Appropriate non-routine labs are ordered dependent on drug ordered. . If applicable, additional medications reviewed and ordered per protocol based on lifetime cumulative doses and/or treatment regimen.   Plan for follow-up and/or issues identified: No . I-vent associated with next due treatment: No . MD and/or nursing notified: No  Romualdo Bolk [facility] 03/16/2020 1:10 PM

## 2020-03-18 ENCOUNTER — Ambulatory Visit: Payer: Medicare Other

## 2020-03-18 ENCOUNTER — Other Ambulatory Visit: Payer: Medicare Other

## 2020-03-18 ENCOUNTER — Ambulatory Visit: Payer: Medicare Other | Admitting: Adult Health

## 2020-03-19 NOTE — Progress Notes (Signed)
[place]  [phone]) [phone] [phone]    ID: [name]   DOB: [birth year]  MR#: [id number]  FFM#:[id number]  Patient Care Team: Libby Maw, MD as PCP - General (Family Medicine) Dariah Mcsorley, [name], MD as Consulting Physician (Oncology) [name], MD as Consulting Physician (Radiation Oncology) [name], [name], MD as Consulting Physician (Cardiology) [name], [name], MD as Consulting Physician (Psychiatry) [name], MD as Consulting Physician (Anesthesiology) [name], MD as Consulting Physician (Internal Medicine) [name], [name] 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  No family came in with her.  She receives Abraxane/trastuzumab/Pertuzumab monthly.  She was due for dose today.  Her most recent treatment was 02/21/2020.  However the patient was extremely somnolent and I could not arouse her in the examination room.  We moved her to the treatment area where she received 2 doses of Narcan without her waking up.  She was then transported to the emergency room.  Since her last visit, she presented to the ED on 02/29/2020 with intractable nausea and vomiting. She was admitted to the hospital for dehydration.   She is scheduled for follow up brain MRI on 04/16/2020.  Her most recent echocardiogram on 12/25/2019 showed an ejection fraction of 60-65%.  REVIEW OF SYSTEMS: Unable to obtain.  Family contacted by phone states they are "not surprised" that she could not be aroused.   BREAST CANCER HISTORY: From the original intake note:  The patient originally presented in May 2003 when she  noticed a lump in the upper inner quadrant of her right breast in September 2003.  She sought attention and had a mammogram which showed an obvious carcinoma in the upper inner quadrant of the right breast, approximately 2 cm.  There were some enlarged lymph nodes in the axilla and an FNA done showed those consistent with malignant cells, most likely an invasive ductal carcinoma.  At that point she was unsure of what to do and was referred by Dr. [name] for a discussion of her treatment options.  By biopsy, it was ER/PR negative and HER-2 was 3+.  DNA index was 1.42.  We reiterated that it was most important to have her disease surgically addressed at that point and had recommended lumpectomy and axillary nodes to be addressed with which she followed through and on 04-03-02, Dr. [name] performed a right partial mastectomy and a right axillary lymph node dissection.  Final pathology revealed a 2.4 cm. high grade, Grade III invasive ductal carcinoma with an adjacent .8 cm. also high grade invasive ductal carcinoma which was felt to represent an intramammary metastasis rather than a second primary.  A smaller mass was just medial to the larger mass.  The smaller mass was associated with high grade DCIS component.  There was no definite lymphovascular invasion identified.  However, one of fourteen axillary lymph nodes did contain a 1.5 cm. metastatic deposit.  Postoperatively she did very well.  We reiterated the fact that she did need adjuvant chemotherapy, however, she refused and decided that she would pursue radiation.  She received radiation and completed that on 08-14-02. Her subsequent history is as detailed below.   PAST MEDICAL HISTORY: Past Medical History:  Diagnosis  Date  . Breast cancer ([place])   . DVT (deep venous thrombosis) ([place]) 10/07/2011  . Fever 02/06/2018  . Hypertension   . Radiation 11/29/2005-12/29/2005   right supraclavicular area 4147 cGy  . Radiation 06/26/02-08/14/02   right breast  5040 cGy, tumor bed boosted to 1260 cGy  . Rheumatic fever     PAST SURGICAL HISTORY: Past Surgical History:  Procedure Laterality Date  . BREAST SURGERY    . MASTECTOMY Bilateral   . PORT A CATH REVISION      FAMILY HISTORY Family History  Problem Relation Age of Onset  . Pancreatic cancer Mother   . Kidney cancer Sister 66  . Breast cancer Sister 83  . Breast cancer Other 48  The patient's father died in his 11H  from complications of alcohol abuse. The patient's mother died in her 56s from pancreatic cancer. The patient has 6 brothers, 2 sisters. Both her sisters have had breast cancer, both diagnosed after the age of 60. The patient has not had genetic testing so far.   GYNECOLOGIC HISTORY: Menarche age 62; first live birth age 39. She is GXP4. She is not sure when she stopped having periods. She never used hormone replacement.   SOCIAL HISTORY: The patient has worked in the past as a Psychologist, counselling. At home in addition to the patient are her husband [name], originally from [place], who works as a Administrator.  The other children are [name], who lives with the patient and has a college degree in psychology; she works as a Probation officer; no longer living in the home is her daughter [name] (she is studying to be a Marine scientist) and grandson.son [name], lives in [place] and works as a Higher education careers adviser. [name] also sometimes stays in the home. In addition the patient has an aide who helps her almost daily.    ADVANCED DIRECTIVES: The patient has completed advanced directives and name her daughter [name] as her healthcare power of attorney.  The patient also completed a living will.  This is notarized.  These documents are being sent for scanning on epic on 01/15/2020  HEALTH MAINTENANCE: Social History   Tobacco Use  . Smoking status: Former Research scientist (life sciences)  . Smokeless tobacco: Never Used  Substance Use Topics  . Alcohol use: Never    Comment: Rarely  . Drug use: No       Colonoscopy:  Not on file  PAP:  Not on file  Bone density:  Not on file  Lipid panel:  Not on file   Allergies  Allergen Reactions  . Tramadol Nausea Only  . Hydrocodone-Acetaminophen Itching and Rash  . Pork-Derived Products Other (See Comments)    Pt says she just doesn't eat pork; has no reaction to pork products    No current facility-administered medications for this visit.   Current Outpatient Medications  Medication Sig Dispense Refill  . allopurinol (ZYLOPRIM) 300 MG tablet Take 300 mg by mouth daily.    Marland Kitchen ALPRAZolam (XANAX) 1 MG tablet Take 1 tablet (1 mg total) by mouth at bedtime as needed for sleep. 15 tablet 0  . Ascorbic Acid (VITAMIN C PO) Take 1 tablet by mouth daily.    . cholecalciferol (VITAMIN D) 1000 units tablet Take 1 tablet (1,000 Units total) by mouth daily. (Patient not taking: Reported on 11/19/2019) 90 tablet 4  . cholecalciferol (VITAMIN D3) 25 MCG (1000 UNIT) tablet Take 1,000 Units by mouth daily.    Marland Kitchen gabapentin (NEURONTIN) 100 MG capsule Take  1 capsule (300 mg total) by mouth 2 (two) times daily as needed. 60 capsule 3  . gabapentin (NEURONTIN) 300 MG capsule Take 300 mg by mouth 2 (two) times daily as needed (pain).    . iron polysaccharides (NIFEREX) 150 MG capsule Take 1 capsule (150 mg total) by mouth daily. 30 capsule 0  . KLOR-CON M20 20 MEQ tablet Take 1 tablet (20 mEq total) by mouth daily. 30 tablet 0  . magic mouthwash SOLN Take 10 mLs by mouth 4 (four) times daily as needed for mouth pain. Swish and spit.     . methadone (DOLOPHINE) 10 MG tablet Take 1 tablet (10 mg total) by mouth daily for 5 days. Taking once a day 5 tablet 0  . Multiple Vitamin (MULTIVITAMIN) capsule Take 1 capsule by mouth daily.      . naloxone (NARCAN) nasal spray 4 mg/0.1 mL Administer single spray of naloxone into one nostril. Additionally, spray can be given every 2-3 minutes as needed until emergency medical assistance arrives. Call 911 immediately after use.  (Patient not taking: Reported on 01/15/2020) 1 each 2  . omeprazole (PRILOSEC) 40 MG capsule Take 1 capsule (40 mg total) by mouth daily. 30 capsule 1  . ondansetron (ZOFRAN) 4 MG tablet Take 1 tablet (4 mg total) by mouth 3 (three) times daily as needed. 20 tablet 3  . Oxycodone HCl 20 MG TABS Take 0.5 tablets (10 mg total) by mouth 3 (three) times daily as needed for up to 5 days (pain). 8 tablet 0  . polyethylene glycol (MIRALAX / GLYCOLAX) 17 g packet Take 17 g by mouth daily as needed for mild constipation. (Patient not taking: Reported on 01/15/2020) 14 each 0  . prochlorperazine (COMPAZINE) 10 MG tablet Take 1 tablet (10 mg total) by mouth every 6 (six) hours as needed for nausea or vomiting. (Patient not taking: Reported on 03/16/2020) 30 tablet 1  . sertraline (ZOLOFT) 100 MG tablet Take 1 tablet (100 mg total) by mouth daily. 90 tablet 2  . torsemide (DEMADEX) 10 MG tablet Take 1 tablet (10 mg total) by mouth daily. 30 tablet 0  . tucatinib (TUKYSA) 150 MG tablet Take 150 mg by mouth every morning.  60 tablet 3   Facility-Administered Medications Ordered in Other Visits  Medication Dose Route Frequency Provider Last Rate Last Admin  . 0.9 %  sodium chloride infusion   Intravenous Continuous Fredia Sorrow, MD      . potassium chloride 10 mEq in 100 mL IVPB  10 mEq Intravenous Once Fredia Sorrow, MD      . potassium chloride 10 mEq in 100 mL IVPB  10 mEq Intravenous Once Fredia Sorrow, MD      . sodium chloride 0.9 % injection 10 mL  10 mL Intravenous PRN Jessi Pitstick, [name], MD   10 mL at 03/04/14 1421  . sodium chloride flush (NS) 0.9 % injection 10 mL  10 mL Intracatheter PRN Nayzeth Altman, [name], MD   10 mL at 07/30/19 1627    OBJECTIVE: African-American woman examined in a wheelchair  Vitals:   03/20/20 0944  BP: 128/71  Pulse: 80  Resp: 18  Temp: 99.1 F (37.3 C)  SpO2: 94%   Wt Readings from Last 3 Encounters:  03/16/20 201 lb 9.6 oz (91.4 kg)  02/29/20 204 lb 11.2  oz (92.9 kg)  02/21/20 204 lb 11.2 oz (92.9 kg)   There is no height or weight on file to calculate BMI.    Photo  07/04/2017     Photo 08/23/2018    LAB RESULTS:.  Lab Results  Component Value Date   WBC 9.9 03/20/2020   NEUTROABS 7.7 03/20/2020   HGB 9.9 (L) 03/20/2020   HCT 29.0 (L) 03/20/2020   MCV 90.7 03/20/2020   PLT 283 03/20/2020      Chemistry      Component Value Date/Time   NA 138 03/20/2020 1208   NA 142 11/09/2017 1214   K 2.2 (LL) 03/20/2020 1208   K 3.9 11/09/2017 1214   CL 94 (L) 03/20/2020 1208   CL 101 03/07/2013 1411   CO2 32 03/20/2020 1145   CO2 32 (H) 11/09/2017 1214   BUN 17 03/20/2020 1208   BUN 19.1 11/09/2017 1214   CREATININE 1.00 03/20/2020 1208   CREATININE 1.11 (H) 03/20/2020 0919   CREATININE 1.0 11/09/2017 1214      Component Value Date/Time   CALCIUM 9.1 03/20/2020 1145   CALCIUM 9.1 11/09/2017 1214   ALKPHOS 77 03/20/2020 1145   ALKPHOS 120 11/09/2017 1214   AST 29 03/20/2020 1145   AST 31 03/20/2020 0919   AST 30 11/09/2017 1214   ALT 20 03/20/2020 1145   ALT 18 03/20/2020 0919   ALT 19 11/09/2017 1214   BILITOT 0.5 03/20/2020 1145   BILITOT 0.3 03/20/2020 0919   BILITOT <0.22 11/09/2017 1214       STUDIES: CT Head Wo Contrast  Result Date: 03/20/2020 CLINICAL DATA:  Altered mental status, metastatic breast cancer EXAM: CT HEAD WITHOUT CONTRAST TECHNIQUE: Contiguous axial images were obtained from the base of the skull through the vertex without intravenous contrast. COMPARISON:  02/10/2020 FINDINGS: Brain: There is new hyperdensity associated with the right frontal metastasis reflecting small volume intralesional hemorrhage. There is unchanged hypoattenuation in the right temporal lobe at the site of other known metastasis. There is no significant mass effect. Ventricles are stable in size and configuration. No new loss of gray-white differentiation. Vascular: Negative. Skull: Calvarium is unremarkable.  Sinuses/Orbits: No acute finding. Other: None. IMPRESSION: Small volume intralesional hemorrhage within treated right frontal metastasis. No significant mass effect. These results were called by telephone at the time of interpretation on 03/20/2020 at 12:25 pm to provider Fredia Sorrow , who verbally acknowledged these results. Electronically Signed   By: Macy Mis [name]   On: 03/20/2020 12:35   DG Chest Port 1 View  Result Date: 03/20/2020 CLINICAL DATA:  Altered mental status EXAM: PORTABLE CHEST 1 VIEW COMPARISON:  02/10/2020 FINDINGS: Left chest wall port catheter is unchanged. Low lung volumes. Mild interstitial prominence. No significant pleural effusion. No pneumothorax. Cardiomediastinal contours are likely within normal limits for technique. IMPRESSION: Low lung volumes. Nonspecific mild interstitial prominence, which could be chronic or reflect mild superimposed edema. Electronically Signed   By: Macy Mis [name]   On: 03/20/2020 12:07     ASSESSMENT:  64 y.o. [place] woman with stage IV breast cancer manifested chiefly by loco-regional nodal disease (neck, chest) and skin involvement, without liver, bone, or brain metastases documented   (1) Status post right upper inner quadrant lumpectomy and sentinel lymph node sampling 03/04/2002 for 2 separate foci of invasive ductal carcinoma, mpT2 pN1 or stage IIB, both foci grade 3, both estrogen and progesterone receptor positive, both HercepTest 3+, Mib-1 56%  (2) Reexcision for margins 05/27/2002 showed no residual cancer in the breast.  (3) The patient refused adjuvant systemic therapy.  (4) Adjuvant radiation treatment completed 08/14/2002.  (5) recurrence in the right breast in  02/2004 showing a morphologically different tumor, again grade 3, again estrogen and progesterone receptor negative, with an MIB-1 of 14% and Herceptest 3+.  (5) Between 03/2004 and 07/2004 she received dose dense Doxorubicin/Cyclophosphamide x 4 given  with trastuzumab, followed by weekly Docetaxel x 8, again given with trastuzumab.  (6) Right mastectomy 07/13/2004 showed scattered microscopic foci of residual disease over an area  greater than 5 cm. Margins were negative.  (7) Postoperative Docetaxel continued until 09/2004.  (8) Trastuzumab (Herceptin) given 08/2004 through 01/2012 with some brief interruptions.  RECURRENT/ STAGE IV DISEASE December 2006 (9) Isolated right cervical nodal recurrence 10/2005, treated with radiation to the right supraclavicular area (total 41.5 gray) completed 12/29/2005.  (10) Navelbine given together with Herceptin  11/2005 through 03/2006.  (9) Left mastectomy 02/13/2006 for ductal carcinoma in situ, grade 2, estrogen and progesterone receptor negative, with negative margins; 0 of 3 lymph nodes involved  (10) treated with Lapatinib and Capecitabine before 10/2009, for an unclear duration and with unclear results (cannot locate data on chart review).  (11) Status post right supraclavicular lymph node biopsy 09/2010 again positive for an invasive ductal carcinoma, estrogen and progesterone receptor negative, HER-2 positive by CISH with a ratio 4.25.  (12) Navelbine given together with Herceptin between 05/2011 and 11/2011.  (13) Carboplatin/ Gemcitabine/ Herceptin given for 2 cycles, in 12/2011 and 01/2012.  (14) TDM-1 ([place]) started 02/2012. Last dose 10/02/2013 after which the patient discontinued treatment at her own discretion. Echo on December 2014 showed a well preserved ejection fraction.  (15) Deep vein thrombosis of the right upper extremity documented 04/20/2011.  She completed anticoagulant therapy with Coumadin on 03/25/2013.  (16) Chronic right upper extremity lymphedema, not responsive to aggressive PT  (a) biopsy of denuded area 04/23/2015 read as dermatofibroma (discordant)  (b) deeper cuts of 04/23/2015 biopsy suggest angiosarcoma  (17) Right chest port-a-cath removal due to  infection on 01/28/2013. Left chest Port-A-Cath placed on 04/08/2013; being flushed every 6 weeks  RIGHT UPPER EXTREMITY ANGIOSARCOMA VS BREAST CANCER: August 2016 (18) treated at [facility] of [place] August 2016 with paclitaxel, trastuzumab and pertuzumab 1 cycle, afterwards referred to hospice  (19) under the care of hospice of [facility] August 2016 to 05/08/2016, when the patient opted for a second try at chemotherapy  (20) started low-dose Abraxane, trastuzumab and pertuzumab 06/07/2016, to be repeated every 4 weeks.   (a) pretreatment echocardiogram 06/06/2016 showed a 60-65% ejection fraction  (b) significant compliance problems compromise response to treatment  (c)  echocardiogram 02/20/17 LVEF 55-60%  (d) Abraxane discontinued 07/04/2017 because of possible neuropathy symptoms  (e) Abraxane resumed 09/27/2017  (f) echocardiogram 07/20/2017 showed an ejection fraction of 60-65%  (g) echo 01/22/2018 showed an ejection fraction of 50-55%  (h) CT scan of the chest 05/03/2018 shows no disease involving the lungs liver or lymph nodes, with stable subcutaneous masses as previously noted  (I) Abraxane discontinued August 2019 because of concerns regarding neuropathy-- gemcitabine substituted  (j) echocardiogram 08/20/2018 shows an ejection fraction in the 55-60% range (done every 6 months)  (k) gemcitabine discontinued because of multiple symptoms, Abraxane restarted 09/11/2018, given once every 4 weeks  (l) echocardiogram 07/17/2019 shows a well-preserved ejection fraction at 55-60%  (m) restaging PET scan 08/20/2019 shows hypermetabolic adenopathy, subcutaneous malignancy as clinically appreciated, but no liver or lung parenchymal lesions   (21) Chronic pain secondary to known metastatic disease  (a) patient signed pain contract 10/19/2016  (b) as of 11/16/2016 receives her pain medications from Dr [name]  (  c)  Dr [name] no longer able to prescribe as of February 2019  (d) all  pain medications now through [facility], Dr Mirna Mires (as of April 2019)  (22) left breast and left axillary adenopathy noted clinically  (a) left axillary lymph node biopsy on 07/24/2019 benign  (23) BRAIN METASTASES: frontal and temporal lobe metastases noted on brain MRI 09/04/2019  (a) adjuvant radiation 09/11/2019-10/16/2019:    The brain was treated to 30 Gy in 10 fractions.   (b) tucatinib prescribed December 2020 at 150 mg daily because of methadone interaction, finally started March 2021 but taken irregularly by the patient.  (c) noncontrast head CT 01/12/2020 stable lesions/improved edema  (d) brain MRI 02/01/2020 shows decreased size of the right frontal and temporal lobe metastases with mild residual edema, no mass-effect, and no evidence of new mets  (e) noncontrast MRI of the cervical, thoracic, and lumbar spine 02/11/2020 and 02/12/2020 showed no metastatic disease  (24) COVID-19 infection 2019-11-19   PLAN: [name] is now 14-1/2 years out from definitive diagnosis of metastatic breast cancer.  Her peripheral disease is generally well controlled on Abraxane plus anti-HER-2 treatment with trastuzumab and Pertuzumab.  She has received radiation for her central nervous system disease and is supposed to be on a very low dose of tucatinib although it is not clear whether she is receiving this.  Today she very lethargic, and not responding to Narcan.  She is being transported to the emergency room for further evaluation.  The patient has completed advanced directives and name her daughter [name] as her healthcare power of attorney.  The patient also completed a living will.  This is notarized.   Note that all her pain medicines are under the care of Dr. Mirna Mires.  Total encounter time 20 minutes.*   Dametrius Sanjuan, [name], MD  03/20/20 1:08 PM Medical Oncology and Hematology [facility] [place], [place] [address] Tel.  [phone]    Fax. [phone]   I, Wilburn Mylar, am acting as scribe for Dr. [name]. Audrielle Vankuren.  I, Lurline Del MD, have reviewed the above documentation for accuracy and completeness, and I agree with the above.   *Total Encounter Time as defined by the Centers for Medicare and Medicaid Services includes, in addition to the face-to-face time of a patient visit (documented in the note above) non-face-to-face time: obtaining and reviewing outside history, ordering and reviewing medications, tests or procedures, care coordination (communications with other health care professionals or caregivers) and documentation in the medical record.

## 2020-03-20 ENCOUNTER — Inpatient Hospital Stay (HOSPITAL_COMMUNITY)
Admission: EM | Admit: 2020-03-20 | Discharge: 2020-03-24 | DRG: 054 | Disposition: A | Discharge status: Home Health | Payer: Medicare Other | Source: Ambulatory Visit | Attending: Internal Medicine | Admitting: Internal Medicine

## 2020-03-20 ENCOUNTER — Inpatient Hospital Stay (HOSPITAL_BASED_OUTPATIENT_CLINIC_OR_DEPARTMENT_OTHER): Payer: Medicare Other | Admitting: Oncology

## 2020-03-20 ENCOUNTER — Other Ambulatory Visit: Payer: Self-pay | Admitting: Medical

## 2020-03-20 ENCOUNTER — Inpatient Hospital Stay: Payer: Medicare Other

## 2020-03-20 ENCOUNTER — Ambulatory Visit (HOSPITAL_BASED_OUTPATIENT_CLINIC_OR_DEPARTMENT_OTHER): Payer: Medicare Other | Admitting: Medical

## 2020-03-20 ENCOUNTER — Emergency Department (HOSPITAL_COMMUNITY): Payer: Medicare Other

## 2020-03-20 ENCOUNTER — Encounter (HOSPITAL_COMMUNITY): Payer: Self-pay

## 2020-03-20 ENCOUNTER — Other Ambulatory Visit: Payer: Self-pay

## 2020-03-20 VITALS — BP 128/71 | HR 80 | Temp 99.1°F | Resp 18

## 2020-03-20 DIAGNOSIS — C7931 Secondary malignant neoplasm of brain: Principal | ICD-10-CM | POA: Diagnosis present

## 2020-03-20 DIAGNOSIS — F419 Anxiety disorder, unspecified: Secondary | ICD-10-CM | POA: Diagnosis present

## 2020-03-20 DIAGNOSIS — Z9221 Personal history of antineoplastic chemotherapy: Secondary | ICD-10-CM | POA: Diagnosis not present

## 2020-03-20 DIAGNOSIS — G92 Toxic encephalopathy: Secondary | ICD-10-CM | POA: Diagnosis present

## 2020-03-20 DIAGNOSIS — C4911 Malignant neoplasm of connective and soft tissue of right upper limb, including shoulder: Secondary | ICD-10-CM

## 2020-03-20 DIAGNOSIS — Z170 Estrogen receptor positive status [ER+]: Secondary | ICD-10-CM

## 2020-03-20 DIAGNOSIS — Z87891 Personal history of nicotine dependence: Secondary | ICD-10-CM

## 2020-03-20 DIAGNOSIS — R4182 Altered mental status, unspecified: Secondary | ICD-10-CM

## 2020-03-20 DIAGNOSIS — Z885 Allergy status to narcotic agent status: Secondary | ICD-10-CM

## 2020-03-20 DIAGNOSIS — Z20822 Contact with and (suspected) exposure to covid-19: Secondary | ICD-10-CM | POA: Diagnosis present

## 2020-03-20 DIAGNOSIS — G894 Chronic pain syndrome: Secondary | ICD-10-CM | POA: Diagnosis present

## 2020-03-20 DIAGNOSIS — R600 Localized edema: Secondary | ICD-10-CM | POA: Diagnosis present

## 2020-03-20 DIAGNOSIS — Z923 Personal history of irradiation: Secondary | ICD-10-CM | POA: Diagnosis not present

## 2020-03-20 DIAGNOSIS — F05 Delirium due to known physiological condition: Secondary | ICD-10-CM | POA: Diagnosis not present

## 2020-03-20 DIAGNOSIS — R531 Weakness: Secondary | ICD-10-CM | POA: Diagnosis present

## 2020-03-20 DIAGNOSIS — Z9013 Acquired absence of bilateral breasts and nipples: Secondary | ICD-10-CM | POA: Diagnosis not present

## 2020-03-20 DIAGNOSIS — G8929 Other chronic pain: Secondary | ICD-10-CM | POA: Diagnosis present

## 2020-03-20 DIAGNOSIS — N390 Urinary tract infection, site not specified: Secondary | ICD-10-CM | POA: Diagnosis present

## 2020-03-20 DIAGNOSIS — C50911 Malignant neoplasm of unspecified site of right female breast: Secondary | ICD-10-CM | POA: Diagnosis not present

## 2020-03-20 DIAGNOSIS — Z79891 Long term (current) use of opiate analgesic: Secondary | ICD-10-CM | POA: Diagnosis not present

## 2020-03-20 DIAGNOSIS — K219 Gastro-esophageal reflux disease without esophagitis: Secondary | ICD-10-CM | POA: Diagnosis present

## 2020-03-20 DIAGNOSIS — D638 Anemia in other chronic diseases classified elsewhere: Secondary | ICD-10-CM | POA: Diagnosis present

## 2020-03-20 DIAGNOSIS — I428 Other cardiomyopathies: Secondary | ICD-10-CM | POA: Diagnosis present

## 2020-03-20 DIAGNOSIS — Z95828 Presence of other vascular implants and grafts: Secondary | ICD-10-CM

## 2020-03-20 DIAGNOSIS — Z8616 Personal history of covid-19: Secondary | ICD-10-CM

## 2020-03-20 DIAGNOSIS — Z803 Family history of malignant neoplasm of breast: Secondary | ICD-10-CM

## 2020-03-20 DIAGNOSIS — E876 Hypokalemia: Secondary | ICD-10-CM | POA: Diagnosis present

## 2020-03-20 DIAGNOSIS — Z86718 Personal history of other venous thrombosis and embolism: Secondary | ICD-10-CM

## 2020-03-20 DIAGNOSIS — M7989 Other specified soft tissue disorders: Secondary | ICD-10-CM | POA: Diagnosis not present

## 2020-03-20 DIAGNOSIS — I10 Essential (primary) hypertension: Secondary | ICD-10-CM | POA: Diagnosis present

## 2020-03-20 DIAGNOSIS — C50211 Malignant neoplasm of upper-inner quadrant of right female breast: Secondary | ICD-10-CM

## 2020-03-20 DIAGNOSIS — I618 Other nontraumatic intracerebral hemorrhage: Secondary | ICD-10-CM | POA: Diagnosis present

## 2020-03-20 DIAGNOSIS — F329 Major depressive disorder, single episode, unspecified: Secondary | ICD-10-CM | POA: Diagnosis present

## 2020-03-20 DIAGNOSIS — B9620 Unspecified Escherichia coli [E. coli] as the cause of diseases classified elsewhere: Secondary | ICD-10-CM | POA: Diagnosis present

## 2020-03-20 DIAGNOSIS — Z66 Do not resuscitate: Secondary | ICD-10-CM | POA: Diagnosis present

## 2020-03-20 DIAGNOSIS — Z91018 Allergy to other foods: Secondary | ICD-10-CM

## 2020-03-20 DIAGNOSIS — Z22322 Carrier or suspected carrier of Methicillin resistant Staphylococcus aureus: Secondary | ICD-10-CM

## 2020-03-20 DIAGNOSIS — Z79899 Other long term (current) drug therapy: Secondary | ICD-10-CM

## 2020-03-20 DIAGNOSIS — G9340 Encephalopathy, unspecified: Secondary | ICD-10-CM | POA: Diagnosis present

## 2020-03-20 LAB — CBC WITH DIFFERENTIAL (CANCER CENTER ONLY)
Abs Immature Granulocytes: 0.08 10*3/uL — ABNORMAL HIGH (ref 0.00–0.07)
Basophils Absolute: 0 10*3/uL (ref 0.0–0.1)
Basophils Relative: 0 %
Eosinophils Absolute: 0.4 10*3/uL (ref 0.0–0.5)
Eosinophils Relative: 4 %
HCT: 30.1 % — ABNORMAL LOW (ref 36.0–46.0)
Hemoglobin: 9.4 g/dL — ABNORMAL LOW (ref 12.0–15.0)
Immature Granulocytes: 1 %
Lymphocytes Relative: 7 %
Lymphs Abs: 0.7 10*3/uL (ref 0.7–4.0)
MCH: 28 pg (ref 26.0–34.0)
MCHC: 31.2 g/dL (ref 30.0–36.0)
MCV: 89.6 fL (ref 80.0–100.0)
Monocytes Absolute: 1 10*3/uL (ref 0.1–1.0)
Monocytes Relative: 10 %
Neutro Abs: 7.9 10*3/uL — ABNORMAL HIGH (ref 1.7–7.7)
Neutrophils Relative %: 78 %
Platelet Count: 277 10*3/uL (ref 150–400)
RBC: 3.36 MIL/uL — ABNORMAL LOW (ref 3.87–5.11)
RDW: 16.2 % — ABNORMAL HIGH (ref 11.5–15.5)
WBC Count: 10.2 10*3/uL (ref 4.0–10.5)
nRBC: 0 % (ref 0.0–0.2)

## 2020-03-20 LAB — CMP (CANCER CENTER ONLY)
ALT: 18 U/L (ref 0–44)
AST: 31 U/L (ref 15–41)
Albumin: 2.9 g/dL — ABNORMAL LOW (ref 3.5–5.0)
Alkaline Phosphatase: 93 U/L (ref 38–126)
Anion gap: 14 (ref 5–15)
BUN: 18 mg/dL (ref 8–23)
CO2: 30 mmol/L (ref 22–32)
Calcium: 9.2 mg/dL (ref 8.9–10.3)
Chloride: 97 mmol/L — ABNORMAL LOW (ref 98–111)
Creatinine: 1.11 mg/dL — ABNORMAL HIGH (ref 0.44–1.00)
GFR, Est AFR Am: >60 mL/min (ref >60)
GFR, Estimated: 52 mL/min — ABNORMAL LOW (ref >60)
Glucose, Bld: 116 mg/dL — ABNORMAL HIGH (ref 70–99)
Potassium: 2.5 mmol/L — CL (ref 3.5–5.1)
Sodium: 141 mmol/L (ref 135–145)
Total Bilirubin: 0.3 mg/dL (ref 0.3–1.2)
Total Protein: 6.7 g/dL (ref 6.5–8.1)

## 2020-03-20 LAB — URINALYSIS, ROUTINE W REFLEX MICROSCOPIC
Bilirubin Urine: NEGATIVE
Glucose, UA: NEGATIVE mg/dL
Hgb urine dipstick: NEGATIVE
Ketones, ur: NEGATIVE mg/dL
Nitrite: POSITIVE — AB
Protein, ur: NEGATIVE mg/dL
Specific Gravity, Urine: 1.009 (ref 1.005–1.030)
WBC, UA: >50 WBC/hpf — ABNORMAL HIGH (ref 0–5)
pH: 6 (ref 5.0–8.0)

## 2020-03-20 LAB — COMPREHENSIVE METABOLIC PANEL
ALT: 20 U/L (ref 0–44)
AST: 29 U/L (ref 15–41)
Albumin: 3 g/dL — ABNORMAL LOW (ref 3.5–5.0)
Alkaline Phosphatase: 77 U/L (ref 38–126)
Anion gap: 11 (ref 5–15)
BUN: 18 mg/dL (ref 8–23)
CO2: 32 mmol/L (ref 22–32)
Calcium: 9.1 mg/dL (ref 8.9–10.3)
Chloride: 99 mmol/L (ref 98–111)
Creatinine, Ser: 0.98 mg/dL (ref 0.44–1.00)
GFR calc Af Amer: >60 mL/min (ref >60)
GFR calc non Af Amer: >60 mL/min (ref >60)
Glucose, Bld: 116 mg/dL — ABNORMAL HIGH (ref 70–99)
Potassium: 2.2 mmol/L — CL (ref 3.5–5.1)
Sodium: 142 mmol/L (ref 135–145)
Total Bilirubin: 0.5 mg/dL (ref 0.3–1.2)
Total Protein: 6.5 g/dL (ref 6.5–8.1)

## 2020-03-20 LAB — CBC WITH DIFFERENTIAL/PLATELET
Abs Immature Granulocytes: 0.07 10*3/uL (ref 0.00–0.07)
Basophils Absolute: 0.1 10*3/uL (ref 0.0–0.1)
Basophils Relative: 1 %
Eosinophils Absolute: 0.4 10*3/uL (ref 0.0–0.5)
Eosinophils Relative: 4 %
HCT: 30.3 % — ABNORMAL LOW (ref 36.0–46.0)
Hemoglobin: 9.1 g/dL — ABNORMAL LOW (ref 12.0–15.0)
Immature Granulocytes: 1 %
Lymphocytes Relative: 7 %
Lymphs Abs: 0.7 10*3/uL (ref 0.7–4.0)
MCH: 27.2 pg (ref 26.0–34.0)
MCHC: 30 g/dL (ref 30.0–36.0)
MCV: 90.7 fL (ref 80.0–100.0)
Monocytes Absolute: 1 10*3/uL (ref 0.1–1.0)
Monocytes Relative: 10 %
Neutro Abs: 7.7 10*3/uL (ref 1.7–7.7)
Neutrophils Relative %: 77 %
Platelets: 283 10*3/uL (ref 150–400)
RBC: 3.34 MIL/uL — ABNORMAL LOW (ref 3.87–5.11)
RDW: 16.1 % — ABNORMAL HIGH (ref 11.5–15.5)
WBC: 9.9 10*3/uL (ref 4.0–10.5)
nRBC: 0 % (ref 0.0–0.2)

## 2020-03-20 LAB — BLOOD GAS, ARTERIAL
Acid-Base Excess: 7.9 mmol/L — ABNORMAL HIGH (ref 0.0–2.0)
Bicarbonate: 32.9 mmol/L — ABNORMAL HIGH (ref 20.0–28.0)
FIO2: 21
O2 Saturation: 83.2 %
Patient temperature: 98.6
pCO2 arterial: 51.1 mmHg — ABNORMAL HIGH (ref 32.0–48.0)
pH, Arterial: 7.425 (ref 7.350–7.450)
pO2, Arterial: 51.1 mmHg — ABNORMAL LOW (ref 83.0–108.0)

## 2020-03-20 LAB — RAPID URINE DRUG SCREEN, HOSP PERFORMED
Amphetamines: NOT DETECTED
Barbiturates: NOT DETECTED
Benzodiazepines: POSITIVE — AB
Cocaine: NOT DETECTED
Opiates: NOT DETECTED
Tetrahydrocannabinol: NOT DETECTED

## 2020-03-20 LAB — I-STAT CHEM 8, ED
BUN: 17 mg/dL (ref 8–23)
Calcium, Ion: 1.23 mmol/L (ref 1.15–1.40)
Chloride: 94 mmol/L — ABNORMAL LOW (ref 98–111)
Creatinine, Ser: 1 mg/dL (ref 0.44–1.00)
Glucose, Bld: 112 mg/dL — ABNORMAL HIGH (ref 70–99)
HCT: 29 % — ABNORMAL LOW (ref 36.0–46.0)
Hemoglobin: 9.9 g/dL — ABNORMAL LOW (ref 12.0–15.0)
Potassium: 2.2 mmol/L — CL (ref 3.5–5.1)
Sodium: 138 mmol/L (ref 135–145)
TCO2: 34 mmol/L — ABNORMAL HIGH (ref 22–32)

## 2020-03-20 LAB — SARS CORONAVIRUS 2 BY RT PCR (HOSPITAL ORDER, PERFORMED IN ~~LOC~~ HOSPITAL LAB): SARS Coronavirus 2: NEGATIVE

## 2020-03-20 LAB — LIPASE, BLOOD: Lipase: 37 U/L (ref 11–51)

## 2020-03-20 LAB — LACTIC ACID, PLASMA: Lactic Acid, Venous: 0.7 mmol/L (ref 0.5–1.9)

## 2020-03-20 LAB — BRAIN NATRIURETIC PEPTIDE: B Natriuretic Peptide: 23.5 pg/mL (ref 0.0–100.0)

## 2020-03-20 LAB — PROTIME-INR
INR: 1 (ref 0.8–1.2)
Prothrombin Time: 12.8 seconds (ref 11.4–15.2)

## 2020-03-20 LAB — CBG MONITORING, ED: Glucose-Capillary: 84 mg/dL (ref 70–99)

## 2020-03-20 LAB — MAGNESIUM: Magnesium: 1.2 mg/dL — ABNORMAL LOW (ref 1.7–2.4)

## 2020-03-20 MED ORDER — POTASSIUM CHLORIDE 10 MEQ/100ML IV SOLN
10.0000 meq | Freq: Once | INTRAVENOUS | Status: DC
  Filled 2020-03-20: qty 100

## 2020-03-20 MED ORDER — ACETAMINOPHEN 650 MG RE SUPP: 650.0000 mg | Freq: Four times a day (QID) | RECTAL | Status: DC | PRN

## 2020-03-20 MED ORDER — NALOXONE HCL 0.4 MG/ML IJ SOLN
0.4000 mg | Freq: Once | INTRAMUSCULAR | Status: AC
  Administered 2020-03-20: 0.4 mg via INTRAVENOUS
  Filled 2020-03-20: qty 1

## 2020-03-20 MED ORDER — SERTRALINE HCL 100 MG PO TABS
100.0000 mg | ORAL_TABLET | Freq: Every day | ORAL | Status: DC
  Administered 2020-03-21 – 2020-03-24 (×4): 100 mg via ORAL
  Filled 2020-03-20 (×2): qty 2
  Filled 2020-03-20 (×2): qty 1

## 2020-03-20 MED ORDER — TUCATINIB 150 MG PO TABS: 150.0000 mg | ORAL_TABLET | Freq: Every morning | ORAL | Status: DC

## 2020-03-20 MED ORDER — GADOBUTROL 1 MMOL/ML IV SOLN
10.0000 mL | Freq: Once | INTRAVENOUS | Status: AC | PRN
  Administered 2020-03-20: 10 mL via INTRAVENOUS

## 2020-03-20 MED ORDER — NALOXONE HCL 0.4 MG/ML IJ SOLN
INTRAMUSCULAR | Status: AC
  Filled 2020-03-20: qty 1

## 2020-03-20 MED ORDER — POTASSIUM CHLORIDE 10 MEQ/100ML IV SOLN
10.0000 meq | Freq: Once | INTRAVENOUS | Status: AC
  Administered 2020-03-20: 10 meq via INTRAVENOUS
  Filled 2020-03-20: qty 100

## 2020-03-20 MED ORDER — ONDANSETRON HCL 4 MG/2ML IJ SOLN
4.0000 mg | Freq: Four times a day (QID) | INTRAMUSCULAR | Status: DC | PRN
  Administered 2020-03-23 – 2020-03-24 (×2): 4 mg via INTRAVENOUS
  Filled 2020-03-20 (×2): qty 2

## 2020-03-20 MED ORDER — NALOXONE HCL 0.4 MG/ML IJ SOLN: 0.4000 mg | INTRAMUSCULAR | Status: DC | PRN

## 2020-03-20 MED ORDER — HYDRALAZINE HCL 20 MG/ML IJ SOLN: 5.0000 mg | Freq: Four times a day (QID) | INTRAMUSCULAR | Status: DC | PRN

## 2020-03-20 MED ORDER — DEXAMETHASONE SODIUM PHOSPHATE 4 MG/ML IJ SOLN
4.0000 mg | INTRAMUSCULAR | Status: DC
  Administered 2020-03-20 – 2020-03-21 (×2): 4 mg via INTRAVENOUS
  Filled 2020-03-20 (×2): qty 1

## 2020-03-20 MED ORDER — SODIUM CHLORIDE 0.9% FLUSH
10.0000 mL | Freq: Once | INTRAVENOUS | Status: AC
  Administered 2020-03-20: 10 mL
  Filled 2020-03-20: qty 10

## 2020-03-20 MED ORDER — ALTEPLASE 2 MG IJ SOLR
2.0000 mg | Freq: Once | INTRAMUSCULAR | Status: DC
  Filled 2020-03-20: qty 2

## 2020-03-20 MED ORDER — SODIUM CHLORIDE 0.9 % IV SOLN
1.0000 g | INTRAVENOUS | Status: DC
  Administered 2020-03-21 – 2020-03-23 (×4): 1 g via INTRAVENOUS
  Filled 2020-03-20: qty 10
  Filled 2020-03-20: qty 1
  Filled 2020-03-20 (×2): qty 10

## 2020-03-20 MED ORDER — ONDANSETRON HCL 4 MG PO TABS: 4.0000 mg | ORAL_TABLET | Freq: Four times a day (QID) | ORAL | Status: DC | PRN

## 2020-03-20 MED ORDER — POTASSIUM CHLORIDE 10 MEQ/100ML IV SOLN
10.0000 meq | INTRAVENOUS | Status: AC
  Administered 2020-03-20 – 2020-03-21 (×4): 10 meq via INTRAVENOUS
  Filled 2020-03-20 (×2): qty 100

## 2020-03-20 MED ORDER — ACETAMINOPHEN 325 MG PO TABS
650.0000 mg | ORAL_TABLET | Freq: Four times a day (QID) | ORAL | Status: DC | PRN
  Administered 2020-03-21: 650 mg via ORAL
  Filled 2020-03-20: qty 2

## 2020-03-20 MED ORDER — PANTOPRAZOLE SODIUM 40 MG IV SOLR
40.0000 mg | Freq: Every day | INTRAVENOUS | Status: DC
  Administered 2020-03-21 – 2020-03-23 (×4): 40 mg via INTRAVENOUS
  Filled 2020-03-20 (×4): qty 40

## 2020-03-20 MED ORDER — SODIUM CHLORIDE 0.9 % IV SOLN
INTRAVENOUS | Status: DC
  Administered 2020-03-22: 75 mL/h via INTRAVENOUS

## 2020-03-20 MED ORDER — LEVETIRACETAM IN NACL 500 MG/100ML IV SOLN
500.0000 mg | Freq: Two times a day (BID) | INTRAVENOUS | Status: DC
  Administered 2020-03-20 – 2020-03-24 (×8): 500 mg via INTRAVENOUS
  Filled 2020-03-20 (×10): qty 100

## 2020-03-20 MED ORDER — NALOXONE HCL 0.4 MG/ML IJ SOLN
0.4000 mg | INTRAMUSCULAR | Status: DC | PRN
  Administered 2020-03-20 (×2): 0.4 mg via INTRAVENOUS

## 2020-03-20 NOTE — ED Notes (Signed)
Daughter at bedside. Pt more alert at this time. EDP to re-eval. Pt voided 338ml in bedpan.

## 2020-03-20 NOTE — Progress Notes (Signed)
Progress note  Paged by bedside RN requesting treatment for possible UTI; urinalysis on admission is positive for nitrites, leukocytes, and many bacteria this coupled with AMS and mild leucocytosis order placed to obtain urine culture and start empiric ceftriaxone.   Johnsie Cancel, NP-C Contact / Pager information can be found on Amion  03/20/2020, 10:31 PM

## 2020-03-20 NOTE — Progress Notes (Signed)
[name] was seen in the infusion room today per the request of Dr. Jana Hakim.  She presented for her appointment with him today and was at her usual baseline while she was being registered and taken to the lab.  According to Dr. Jinny Blossom insertion she presented to Dr. Vernice Jefferson as she was very sedated and nearly unresponsive.  She was brought to the infusion room where she was given Narcan 0.4 mg IV followed by a saline flush.  Her breathing was shallow and slow initially.  After giving her Narcan she was noted to be arousable to voice command was breathing more deeply and more frequently.  She then appeared to again become sedate and was noted to have shallow breathing again.  She was given an additional 0.4 mg of Narcan IV followed by saline flush.  She did not respond readily to this intervention and was taken to the emergency room for evaluation and management.  Her vital signs were blood pressure 108/93 pulse 79 oxygen 95 and then 100% on room air when repeated.  Sandi Mealy, MHS, PA-C Physician Assistant

## 2020-03-20 NOTE — H&P (Signed)
History and Physical    [name] [id number] DOB: 1956-02-14 DOA: 03/20/2020  PCP: Libby Maw, MD   Patient coming from: oncology office  Chief Complaint: confusion  HPI: [name] is a 64 y.o. female with medical history significant for cancer with mets to brain, 3, chronic leg edema mild with recurrent admission for hypokalemia, GERD, chronic pain syndrome on methadone, oxycodone and gabapentin, chronic anemia/generalized weakness, anxiety/depression or wanting to Dr. Jana Hakim office for chemotherapy today.  She was seen there by transporter but upon arrival patient was very sleepy but she will open her eyes, nonverbal moving extremities, patient was given Narcan 1 dose and sent to the ED for evaluation  ED Course: Blood pressure is stable, afebrile, saturating well on room air, blood work-up showed severe hypokalemia 2.2, hemoglobin stable 9.1 g, acid 0.7 BNP 23.5.  20 MDQ potassium chloride IV was ordered but she had issue with port access-which is now fixed and is being started on iv kcl.  Patient was also given naloxone at the oncology unit and also in the ED.  Patient has since been more alert awake interactive and answering questions.  That has been sent, CXR "Low lung volumes. Nonspecific mild interstitial prominence, which could be chronic or reflect mild superimposed edema".  CT head was done that showed  "Small volume intralesional hemorrhage within treated right frontal metastasis. No significant mass effect."  EDP discussed with the neurosurgery felt no indication for acute intervention and less likely a small volume hemorrhage causing any issues.  Patient is being admitted for further management. On my eval patient is much more alert awake interactive intermittently confused at times able to recall that she came to the oncology unit today for her appointment, but poor historian.  Daughter is at the bedside. She denies chest pain, nausea, vomiting, diarrhea, fever,  chills.  Review of Systems: Full review of system was unable to be obtained but patient mostly denied any other symptoms except for leg edema chronic pain.    Past Medical History:  Diagnosis Date  . Breast cancer ([place])   . DVT (deep venous thrombosis) ([place]) 10/07/2011  . Fever 02/06/2018  . Hypertension   . Radiation 11/29/2005-12/29/2005   right supraclavicular area 4147 cGy  . Radiation 06/26/02-08/14/02   right breast 5040 cGy, tumor bed boosted to 1260 cGy  . Rheumatic fever     Past Surgical History:  Procedure Laterality Date  . BREAST SURGERY    . MASTECTOMY Bilateral   . PORT A CATH REVISION       reports that she has quit smoking. She has never used smokeless tobacco. She reports that she does not drink alcohol or use drugs.  Allergies  Allergen Reactions  . Tramadol Nausea Only  . Hydrocodone-Acetaminophen Itching and Rash  . Pork-Derived Products Other (See Comments)    Pt says she just doesn't eat pork; has no reaction to pork products    Family History  Problem Relation Age of Onset  . Pancreatic cancer Mother   . Kidney cancer Sister 46  . Breast cancer Sister 41  . Breast cancer Other 33     Prior to Admission medications   Medication Sig Start Date End Date Taking? Authorizing Provider  allopurinol (ZYLOPRIM) 300 MG tablet Take 300 mg by mouth daily. 01/20/20   [provider]  ALPRAZolam Duanne Moron) 1 MG tablet Take 1 tablet (1 mg total) by mouth at bedtime as needed for sleep. 01/13/20   Truitt Merle, MD  Ascorbic  Acid (VITAMIN C PO) Take 1 tablet by mouth daily.    [provider]  cholecalciferol (VITAMIN D) 1000 units tablet Take 1 tablet (1,000 Units total) by mouth daily. Patient not taking: Reported on 11/19/2019 05/16/18   [name], [name], MD  cholecalciferol (VITAMIN D3) 25 MCG (1000 UNIT) tablet Take 1,000 Units by mouth daily.    [provider]  gabapentin (NEURONTIN) 100 MG capsule Take 1 capsule (300 mg total) by mouth 2  (two) times daily as needed. 02/18/20   [name], [name], MD  gabapentin (NEURONTIN) 300 MG capsule Take 300 mg by mouth 2 (two) times daily as needed (pain).    [provider]  iron polysaccharides (NIFEREX) 150 MG capsule Take 1 capsule (150 mg total) by mouth daily. 03/02/20   Sheikh, Omair Latif, DO  KLOR-CON M20 20 MEQ tablet Take 1 tablet (20 mEq total) by mouth daily. 03/02/20   Raiford Noble Latif, DO  magic mouthwash SOLN Take 10 mLs by mouth 4 (four) times daily as needed for mouth pain. Swish and spit.  10/18/19   [provider]  methadone (DOLOPHINE) 10 MG tablet Take 1 tablet (10 mg total) by mouth daily for 5 days. Taking once a day 03/16/20 03/21/20  Libby Maw, MD  Multiple Vitamin (MULTIVITAMIN) capsule Take 1 capsule by mouth daily.      [provider]  naloxone [facility]) nasal spray 4 mg/0.1 mL Administer single spray of naloxone into one nostril. Additionally, spray can be given every 2-3 minutes as needed until emergency medical assistance arrives. Call 911 immediately after use. Patient not taking: Reported on 01/15/2020 10/25/19   [name], [name], MD  omeprazole (PRILOSEC) 40 MG capsule Take 1 capsule (40 mg total) by mouth daily. 02/04/20   [name], [name], MD  ondansetron (ZOFRAN) 4 MG tablet Take 1 tablet (4 mg total) by mouth 3 (three) times daily as needed. 03/02/20   Raiford Noble Latif, DO  Oxycodone HCl 20 MG TABS Take 0.5 tablets (10 mg total) by mouth 3 (three) times daily as needed for up to 5 days (pain). 03/16/20 03/21/20  Libby Maw, MD  polyethylene glycol (MIRALAX / GLYCOLAX) 17 g packet Take 17 g by mouth daily as needed for mild constipation. Patient not taking: Reported on 01/15/2020 11/21/19   Mercy Riding, MD  prochlorperazine (COMPAZINE) 10 MG tablet Take 1 tablet (10 mg total) by mouth every 6 (six) hours as needed for nausea or vomiting. Patient not taking: Reported on 03/16/2020 03/02/20   [name], [name], MD  sertraline (ZOLOFT) 100 MG tablet Take 1 tablet (100 mg total) by mouth daily. 03/16/20   Libby Maw, MD  torsemide (DEMADEX) 10 MG tablet Take 1 tablet (10 mg total) by mouth daily. 03/02/20   Sheikh, Omair Latif, DO  tucatinib (TUKYSA) 150 MG tablet Take 150 mg by mouth every morning.  12/18/19   [name], [name], MD    Physical Exam: Vitals:   03/20/20 1300 03/20/20 1330 03/20/20 1545 03/20/20 1600  BP: 106/60 (!) 101/50 104/62 117/69  Pulse: 72 73 70 71  Resp: (!) 29 (!) 23 16 (!) 22  Temp:      SpO2: 100% 94% 100% 98%    General exam: Alert awake oriented to place people, self .  At times mildly sleepy, on room air.   HEENT:Oral mucosa moist, Ear/Nose WNL grossly, dentition normal. Respiratory system: bilaterally clear,no wheezing or crackles,no use of accessory muscle Cardiovascular system:  S1 & S2 +, No JVD,. Gastrointestinal system: Abdomen soft, NT,ND, BS+ Nervous System:Alert, awake, moving extremities and grossly nonfocal, with generalized weakness Extremities: Chronic appearing bilateral ankle edema, distal peripheral pulses palpable.  Skin: No rashes,no icterus. MSK: Normal muscle bulk,tone, power   Labs on Admission: I have personally reviewed following labs and imaging studies  CBC: Recent Labs  Lab 03/20/20 0919 03/20/20 1145 03/20/20 1208  WBC 10.2 9.9  --   NEUTROABS 7.9* 7.7  --   HGB 9.4* 9.1* 9.9*  HCT 30.1* 30.3* 29.0*  MCV 89.6 90.7  --   PLT 277 283  --    Basic Metabolic Panel: Recent Labs  Lab 03/20/20 0919 03/20/20 1145 03/20/20 1208  NA 141 142 138  K 2.5* 2.2* 2.2*  CL 97* 99 94*  CO2 30 32  --   GLUCOSE 116* 116* 112*  BUN 18 18 17   CREATININE 1.11* 0.98 1.00  CALCIUM 9.2 9.1  --    GFR: Estimated Creatinine Clearance: 62.3 mL/min (by C-G formula based on SCr of 1 mg/dL). Liver Function Tests: Recent Labs  Lab 03/20/20 0919 03/20/20 1145  AST 31 29  ALT 18 20  ALKPHOS 93 77  BILITOT 0.3 0.5  PROT 6.7  6.5  ALBUMIN 2.9* 3.0*   Recent Labs  Lab 03/20/20 1145  LIPASE 37   No results for input(s): AMMONIA in the last 168 hours. Coagulation Profile: Recent Labs  Lab 03/20/20 1145  INR 1.0   Cardiac Enzymes: No results for input(s): CKTOTAL, CKMB, CKMBINDEX, TROPONINI in the last 168 hours. BNP (last 3 results) No results for input(s): PROBNP in the last 8760 hours. HbA1C: No results for input(s): HGBA1C in the last 72 hours. CBG: Recent Labs  Lab 03/20/20 1050  GLUCAP 84   Lipid Profile: No results for input(s): CHOL, HDL, LDLCALC, TRIG, CHOLHDL, LDLDIRECT in the last 72 hours. Thyroid Function Tests: No results for input(s): TSH, T4TOTAL, FREET4, T3FREE, THYROIDAB in the last 72 hours. Anemia Panel: No results for input(s): VITAMINB12, FOLATE, FERRITIN, TIBC, IRON, RETICCTPCT in the last 72 hours. Urine analysis:    Component Value Date/Time   COLORURINE YELLOW (A) 02/29/2020 1832   APPEARANCEUR CLEAR 02/29/2020 1832   LABSPEC 1.010 02/29/2020 1832   PHURINE 7.5 02/29/2020 1832   GLUCOSEU NEGATIVE 02/29/2020 1832   HGBUR NEGATIVE 02/29/2020 1832   BILIRUBINUR SMALL (A) 02/29/2020 1832   KETONESUR TRACE (A) 02/29/2020 1832   PROTEINUR TRACE (A) 02/29/2020 1832   UROBILINOGEN 0.2 12/29/2010 1427   NITRITE NEGATIVE 02/29/2020 1832   LEUKOCYTESUR NEGATIVE 02/29/2020 1832    Radiological Exams on Admission: CT Head Wo Contrast  Result Date: 03/20/2020 CLINICAL DATA:  Altered mental status, metastatic breast cancer EXAM: CT HEAD WITHOUT CONTRAST TECHNIQUE: Contiguous axial images were obtained from the base of the skull through the vertex without intravenous contrast. COMPARISON:  02/10/2020 FINDINGS: Brain: There is new hyperdensity associated with the right frontal metastasis reflecting small volume intralesional hemorrhage. There is unchanged hypoattenuation in the right temporal lobe at the site of other known metastasis. There is no significant mass effect.  Ventricles are stable in size and configuration. No new loss of gray-white differentiation. Vascular: Negative. Skull: Calvarium is unremarkable. Sinuses/Orbits: No acute finding. Other: None. IMPRESSION: Small volume intralesional hemorrhage within treated right frontal metastasis. No significant mass effect. These results were called by telephone at the time of interpretation on 03/20/2020 at 12:25 pm to provider Fredia Sorrow , who verbally acknowledged these results. Electronically Signed  By: Macy Mis [name]   On: 03/20/2020 12:35   DG Chest Port 1 View  Result Date: 03/20/2020 CLINICAL DATA:  Altered mental status EXAM: PORTABLE CHEST 1 VIEW COMPARISON:  02/10/2020 FINDINGS: Left chest wall port catheter is unchanged. Low lung volumes. Mild interstitial prominence. No significant pleural effusion. No pneumothorax. Cardiomediastinal contours are likely within normal limits for technique. IMPRESSION: Low lung volumes. Nonspecific mild interstitial prominence, which could be chronic or reflect mild superimposed edema. Electronically Signed   By: Macy Mis [name]   On: 03/20/2020 12:07     Assessment/Plan  Acute encephalopathy:possible seizure in the setting of brain mets. CT scan shows intralesional hemorrhage per neurosurgery, with whom the ED be discussed, less likely the etiology.  I discussed with Dr. [name] patient's neuro oncologist- and will empirically put her on Keppra 500 mg twice daily, dexamethasone 4 mg daily keep on seizure precautions,check EEG, MRI brain with and without contrast.  Hold off on pain medication as it could contribute.  For neuro-oncology okay to admit on [place] for now unless patient has recurrent seizure or has need for LTM.  She is more alert awake but intermittently sleepy.  I will hold off her gabapentin, and Xanax and opiates for today.  Severe hypokalemia patient is just getting her 96 M EQ of potassium chloride as she had issues with her port.  We  will ordered 40 and medically additional potassium chloride and repeat potassium.  Hold her torsemide for now  Chronic leg edema on torsemide will hold torsemide for now  Essential hypertension: Pressure stable not on meds  Chronic pain: Holding pain medication due to her somnolence.  Please resume slowly from tomorrow once she is fully alert awake   Port catheter in place  Generalized weakness: Been having weakness for some time likely worsened by her hypokalemia.  PT eval  Goals of care:DNR as per discussion with patient and patient's daughter at the bedside.  She has a DNR form is scanned in the chart as well.  Overall prognosis does not appear bright patient and family understands.  Severity of Illness: * I certify that at the point of admission it is my clinical judgment that the patient will require inpatient hospital care spanning beyond 2 midnights from the point of admission due to high intensity of service, high risk for further deterioration and high frequency of surveillance required for acute encephalopathy severe hyperkalemia and need for further work-up with EEG MRI brain    DVT prophylaxis:  SCD Code Status: DNR as per discussion with patient and patient's daughter at the bedside.  She has a DNR form is scanned in the chart as well. Family Communication: Admission, patients condition and plan of care including tests being ordered have been discussed with the patient and her daughter at bedside who indicate understanding and agree with the plan and Code Status.  Consults called:  Neurooncology Dr [name]  Neurosurgery by EDP  Antonieta Pert MD [facility]  If 7PM-7AM, please contact night-coverage [email or web address]  03/20/2020, 4:33 PM

## 2020-03-20 NOTE — Progress Notes (Signed)
VAST consulted to evaluate chest port as pt came to ED from Cancer center with port already accessed and ER nurse was initially able to draw approximately 10 mls blood before flushing with difficulty. Arrived at bedside with left chest port accessed. No blood return upon aspiration and port difficult to flush. De-accessed port and re-accessed per protocol. No blood return obtained, but easy to flush with NS. Advised pt's nurse Altha Harm, port can be used for fluids and medication at this time. Asked that she place an IV team consult when there is a 2 hour time period that TPA can be instilled to dwell. Christine verbalized understanding.

## 2020-03-20 NOTE — ED Provider Notes (Addendum)
[facility] DEPT Provider Note   CSN: [id number] Arrival date & time: 03/20/20  1040     History Chief Complaint  Patient presents with  . Altered Mental Status    [name] is a 64 y.o. female.  Patient sent over from hematology oncology clinic for becoming unresponsive.  Patient has known metastatic breast cancer.  Currently under treatment by Dr. Jana Hakim.  Apparently patient was fine earlier.  Patient known to have metastatic disease to the brain.  Upon arrival here patient very sleepy but she will open her eyes.  But nonverbal.  Is moving her extremities on her own but not purposefully.        Past Medical History:  Diagnosis Date  . Breast cancer ([place])   . DVT (deep venous thrombosis) ([place]) 10/07/2011  . Fever 02/06/2018  . Hypertension   . Radiation 11/29/2005-12/29/2005   right supraclavicular area 4147 cGy  . Radiation 06/26/02-08/14/02   right breast 5040 cGy, tumor bed boosted to 1260 cGy  . Rheumatic fever     Patient Active Problem List   Diagnosis Date Noted  . Cancer of right breast metastatic to brain ([place]) 03/16/2020  . Nausea & vomiting 02/29/2020  . UTI (urinary tract infection) 02/11/2020  . Acute metabolic encephalopathy 123456  . Generalized weakness 02/11/2020  . Hypokalemia 02/11/2020  . Paresis of left lower extremity ([place]) 02/11/2020  . Paresis of right lower extremity ([place]) 02/11/2020  . Drug-induced polyneuropathy ([place]) 12/19/2019  . Acute respiratory failure with hypoxia ([place]) 11/19/2019  . COVID-19 virus infection 11/19/2019  . Overdose opiate, accidental or unintentional, initial encounter ([place]) 11/19/2019  . Acute encephalopathy 11/19/2019  . Metastasis to brain ([place]) 09/13/2019  . Acute lower UTI 02/06/2018  . Altered mental status 02/06/2018  . Bacteremia 02/06/2018  . Polypharmacy 02/06/2018  . Goals of care, counseling/discussion 09/05/2017  . Pneumonia 01/25/2017  . CAP (community acquired  pneumonia) 01/23/2017  . Port catheter in place 05/30/2016  . 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 Chronic pain 04/13/2015  . Left arm pain 08/18/2014  . Malignant neoplasm of upper-inner quadrant of right breast in female, estrogen receptor positive ([place]) 10/03/2013  . Post-lymphadenectomy lymphedema of arm 08/15/2013  . Port or reservoir infection 01/29/2013  . DVT (deep venous thrombosis) ([place]) 10/07/2011  . Chest pain 09/10/2009  . Secondary cardiomyopathy ([place]) 09/02/2009  . Essential hypertension 02/26/2009  . GERD 02/26/2009    Past Surgical History:  Procedure Laterality Date  . BREAST SURGERY    . MASTECTOMY Bilateral   . PORT A CATH REVISION       OB History   No obstetric history on file.     Family History  Problem Relation Age of Onset  . Pancreatic cancer Mother   . Kidney cancer Sister 13  . Breast cancer Sister 45  . Breast cancer Other 21    Social History   Tobacco Use  . Smoking status: Former Research scientist (life sciences)  . Smokeless tobacco: Never Used  Substance Use Topics  . Alcohol use: Never    Comment: Rarely  . Drug use: No    Home Medications Prior to Admission medications   Medication Sig Start Date End Date Taking? Authorizing Provider  allopurinol (ZYLOPRIM) 300 MG tablet Take 300 mg by mouth daily. 01/20/20   [provider]  ALPRAZolam Duanne Moron) 1 MG tablet Take 1 tablet (1 mg total) by mouth at bedtime as needed for sleep. 01/13/20   Truitt Merle, MD  Ascorbic Acid (VITAMIN C PO) Take 1 tablet by mouth daily.    [provider]  cholecalciferol (VITAMIN D) 1000 units tablet Take 1 tablet (1,000 Units total) by mouth daily. Patient not taking: Reported on 11/19/2019 05/16/18   [name], [name], MD  cholecalciferol (VITAMIN D3) 25 MCG (1000 UNIT) tablet Take 1,000 Units by mouth daily.    [provider]  gabapentin (NEURONTIN) 100 MG capsule Take 1 capsule (300 mg total) by mouth 2 (two) times daily as  needed. 02/18/20   [name], [name], MD  gabapentin (NEURONTIN) 300 MG capsule Take 300 mg by mouth 2 (two) times daily as needed (pain).    [provider]  iron polysaccharides (NIFEREX) 150 MG capsule Take 1 capsule (150 mg total) by mouth daily. 03/02/20   Sheikh, Omair Latif, DO  KLOR-CON M20 20 MEQ tablet Take 1 tablet (20 mEq total) by mouth daily. 03/02/20   Raiford Noble Latif, DO  magic mouthwash SOLN Take 10 mLs by mouth 4 (four) times daily as needed for mouth pain. Swish and spit.  10/18/19   [provider]  methadone (DOLOPHINE) 10 MG tablet Take 1 tablet (10 mg total) by mouth daily for 5 days. Taking once a day 03/16/20 03/21/20  Libby Maw, MD  Multiple Vitamin (MULTIVITAMIN) capsule Take 1 capsule by mouth daily.      [provider]  naloxone [facility]) nasal spray 4 mg/0.1 mL Administer single spray of naloxone into one nostril. Additionally, spray can be given every 2-3 minutes as needed until emergency medical assistance arrives. Call 911 immediately after use. Patient not taking: Reported on 01/15/2020 10/25/19   [name], [name], MD  omeprazole (PRILOSEC) 40 MG capsule Take 1 capsule (40 mg total) by mouth daily. 02/04/20   [name], [name], MD  ondansetron (ZOFRAN) 4 MG tablet Take 1 tablet (4 mg total) by mouth 3 (three) times daily as needed. 03/02/20   Raiford Noble Latif, DO  Oxycodone HCl 20 MG TABS Take 0.5 tablets (10 mg total) by mouth 3 (three) times daily as needed for up to 5 days (pain). 03/16/20 03/21/20  Libby Maw, MD  polyethylene glycol (MIRALAX / GLYCOLAX) 17 g packet Take 17 g by mouth daily as needed for mild constipation. Patient not taking: Reported on 01/15/2020 11/21/19   Mercy Riding, MD  prochlorperazine (COMPAZINE) 10 MG tablet Take 1 tablet (10 mg total) by mouth every 6 (six) hours as needed for nausea or vomiting. Patient not taking: Reported on 03/16/2020 03/02/20   [name], [name], MD  sertraline  (ZOLOFT) 100 MG tablet Take 1 tablet (100 mg total) by mouth daily. 03/16/20   Libby Maw, MD  torsemide (DEMADEX) 10 MG tablet Take 1 tablet (10 mg total) by mouth daily. 03/02/20   Sheikh, Omair Latif, DO  tucatinib (TUKYSA) 150 MG tablet Take 150 mg by mouth every morning.  12/18/19   [name], [name], MD    Allergies    Tramadol, Hydrocodone-acetaminophen, and Pork-derived products  Review of Systems   Review of Systems  Unable to perform ROS: Mental status change    Physical Exam Updated Vital Signs BP 120/60   Pulse 78   Temp 98.4 F (36.9 C)   Resp (!) 21   SpO2 100%   Physical Exam Vitals and nursing note reviewed.  Constitutional:      General: She is not in acute distress.    Appearance: Normal appearance. She is well-developed.  HENT:  Head: Normocephalic and atraumatic.  Eyes:     Extraocular Movements: Extraocular movements intact.     Conjunctiva/sclera: Conjunctivae normal.     Pupils: Pupils are equal, round, and reactive to light.  Cardiovascular:     Rate and Rhythm: Normal rate and regular rhythm.     Heart sounds: No murmur.  Pulmonary:     Effort: Pulmonary effort is normal. No respiratory distress.     Breath sounds: Normal breath sounds.     Comments: Port-A-Cath left anterior chest. Abdominal:     Palpations: Abdomen is soft.     Tenderness: There is no abdominal tenderness.  Musculoskeletal:        General: Swelling present. Normal range of motion.     Cervical back: Normal range of motion and neck supple.  Skin:    General: Skin is warm and dry.     Capillary Refill: Capillary refill takes less than 2 seconds.  Neurological:     Mental Status: She is alert.     Comments: Patient will open eyes spontaneously.  But nonverbal.  Moving all 4 extremities spontaneously.     ED Results / Procedures / Treatments   Labs (all labs ordered are listed, but only abnormal results are displayed) Labs Reviewed  CBC WITH  DIFFERENTIAL/PLATELET - Abnormal; Notable for the following components:      Result Value   RBC 3.34 (*)    Hemoglobin 9.1 (*)    HCT 30.3 (*)    RDW 16.1 (*)    All other components within normal limits  I-STAT CHEM 8, ED - Abnormal; Notable for the following components:   Potassium 2.2 (*)    Chloride 94 (*)    Glucose, Bld 112 (*)    TCO2 34 (*)    Hemoglobin 9.9 (*)    HCT 29.0 (*)    All other components within normal limits  CULTURE, BLOOD (ROUTINE X 2)  CULTURE, BLOOD (ROUTINE X 2)  SARS CORONAVIRUS 2 BY RT PCR (HOSPITAL ORDER, [facility])  PROTIME-INR  COMPREHENSIVE METABOLIC PANEL  LIPASE, BLOOD  URINALYSIS, ROUTINE W REFLEX MICROSCOPIC  BRAIN NATRIURETIC PEPTIDE  LACTIC ACID, PLASMA  CBG MONITORING, ED    EKG EKG Interpretation  Date/Time:  Friday Mar 20 2020 11:16:44 EDT Ventricular Rate:  75 PR Interval:    QRS Duration: 103 QT Interval:  425 QTC Calculation: 475 R Axis:   29 Text Interpretation: Sinus rhythm Abnormal R-wave progression, early transition Left ventricular hypertrophy Confirmed by Fredia Sorrow [phone]) on 03/20/2020 11:19:37 AM   Radiology DG Chest Port 1 View  Result Date: 03/20/2020 CLINICAL DATA:  Altered mental status EXAM: PORTABLE CHEST 1 VIEW COMPARISON:  02/10/2020 FINDINGS: Left chest wall port catheter is unchanged. Low lung volumes. Mild interstitial prominence. No significant pleural effusion. No pneumothorax. Cardiomediastinal contours are likely within normal limits for technique. IMPRESSION: Low lung volumes. Nonspecific mild interstitial prominence, which could be chronic or reflect mild superimposed edema. Electronically Signed   By: Macy Mis [name]   On: 03/20/2020 12:07    Procedures Procedures (including critical care time)  CRITICAL CARE Performed by: Fredia Sorrow Total critical care time: 35 minutes Critical care time was exclusive of separately billable procedures and treating  other patients. Critical care was necessary to treat or prevent imminent or life-threatening deterioration. Critical care was time spent personally by me on the following activities: development of treatment plan with patient and/or surrogate as well as nursing, discussions with consultants,  evaluation of patient's response to treatment, examination of patient, obtaining history from patient or surrogate, ordering and performing treatments and interventions, ordering and review of laboratory studies, ordering and review of radiographic studies, pulse oximetry and re-evaluation of patient's condition.   Medications Ordered in ED Medications  0.9 %  sodium chloride infusion (has no administration in time range)  potassium chloride 10 mEq in 100 mL IVPB (has no administration in time range)  potassium chloride 10 mEq in 100 mL IVPB (has no administration in time range)  naloxone [facility]) injection 0.4 mg (0.4 mg Intravenous Given 03/20/20 1225)    ED Course  I have reviewed the triage vital signs and the nursing notes.  Pertinent labs & imaging results that were available during my care of the patient were reviewed by me and considered in my medical decision making (see chart for details).    MDM Rules/Calculators/A&P                      Patient however observation here slowly started to wake up now verbalizing some but still sleepy.  Always felt that there was some sort of overmedication patient on pain medication as well as Xanax.  Patient did have a blood gas done but her oxygen sats on the pulse ox have been upper 90s to 100% on room air.  Not sure what to make of the blood gas that we got.  But patient definitely improving.  Discussed with hospitalist because of the low potassium of 2.2 which is critical.  Patient will receive IV potassium.  Also contacted hematology oncology.  They can let Dr. Jana Hakim know the patient is going to be admitted.  Also put placed a call into  neurosurgery at the request of the hospitalist.  But do feel that the small hemorrhage into the metastatic lesion in the brain without any mass-effect is not really contributory and probably not an operative situation.  Discussed with Sherley Bounds from neurosurgery.  Felt patient was safe for admission here.  He did raise question of maybe whether there was a seizure and she was postictal for the mental status changes.  Which is an excellent thought.  Already had discussed with the hospitalist about getting MRI while she is in the hospital.  Neurosurgery also thought that would be a good thing to get none.   Final Clinical Impression(s) / ED Diagnoses Final diagnoses:  Altered mental status, unspecified altered mental status type  Hypokalemia    Rx / DC Orders ED Discharge Orders    None       Fredia Sorrow, MD 03/20/20 1502    Fredia Sorrow, MD 03/20/20 1510

## 2020-03-20 NOTE — ED Notes (Signed)
Pt lying in bed, eye's closed chest rising and falling. Pt awakens to verbal stimulation. o2 applied at 2L while pt sleeping.

## 2020-03-20 NOTE — ED Notes (Signed)
Pt lying in bed, eye's closed, chest rising and falling. NAD noted. Will continue to monitor.  

## 2020-03-20 NOTE — Progress Notes (Signed)
Patient ID: [name], female   DOB: 07-29-1956, 64 y.o.   MRN: [id number] We were called to evaluate the CT scan of the head on this 64 year old female with a known metastatic breast cancer to the brain, who was at her oncology appointment this morning when she became difficult to arouse.  We do not know if this was witnessed.  She was sent to the emergency department for evaluation.  Here her mental status has improved to almost baseline.  She is talking and answering questions.  CT scan showed the right frontal metastatic lesion with a tiny amount of hyperdensity suggestive of some blood and neurosurgical evaluation was requested.  She has a small right frontal lesion which is hypodense with a small amount of hyperdense material within it without significant mass-effect or shift.  Some surrounding edema.  MRI has been ordered.  She is being admitted to the hospitalist for hypokalemia.  We do not see anything on the CT scan that would cause somnolence.  I see nothing that requires acute surgical intervention  Should consider the possibility of seizure as the cause of mental status change  Obviously there are other etiologies such as infectious or metabolic or medicinal etc. and it appears these things have already been considered.  We are happy to look at the MRI if it is performed while she is in the hospital.

## 2020-03-20 NOTE — ED Notes (Signed)
Pt lying in bed awake and talking. Pt denies any needs. Updated on plan of care. Will continue to monitor.

## 2020-03-20 NOTE — ED Triage Notes (Signed)
63 yo female brought over from the [facility] via w/c. Pt was awake and alert when she arrived there but then was found slumped over. Pt was given 2.5 of Narcan via Porta Cath at the [facility] and then brought here. Responds to sternal rub by moving arms but remains asleep. Resp wnl, equal and non-labored. Skin w/d/pink. FSBS=84 on arrival to ED.

## 2020-03-20 NOTE — ED Notes (Signed)
Pt lying in bed, eye's closed, chest rising and falling. Will continue to monitor.  

## 2020-03-21 DIAGNOSIS — I10 Essential (primary) hypertension: Secondary | ICD-10-CM

## 2020-03-21 LAB — CBC
HCT: 29.8 % — ABNORMAL LOW (ref 36.0–46.0)
Hemoglobin: 8.8 g/dL — ABNORMAL LOW (ref 12.0–15.0)
MCH: 27.1 pg (ref 26.0–34.0)
MCHC: 29.5 g/dL — ABNORMAL LOW (ref 30.0–36.0)
MCV: 91.7 fL (ref 80.0–100.0)
Platelets: 276 10*3/uL (ref 150–400)
RBC: 3.25 MIL/uL — ABNORMAL LOW (ref 3.87–5.11)
RDW: 16.1 % — ABNORMAL HIGH (ref 11.5–15.5)
WBC: 6.8 10*3/uL (ref 4.0–10.5)
nRBC: 0 % (ref 0.0–0.2)

## 2020-03-21 LAB — COMPREHENSIVE METABOLIC PANEL
ALT: 16 U/L (ref 0–44)
AST: 23 U/L (ref 15–41)
Albumin: 2.6 g/dL — ABNORMAL LOW (ref 3.5–5.0)
Alkaline Phosphatase: 70 U/L (ref 38–126)
Anion gap: 7 (ref 5–15)
BUN: 10 mg/dL (ref 8–23)
CO2: 30 mmol/L (ref 22–32)
Calcium: 8.6 mg/dL — ABNORMAL LOW (ref 8.9–10.3)
Chloride: 102 mmol/L (ref 98–111)
Creatinine, Ser: 0.63 mg/dL (ref 0.44–1.00)
GFR calc Af Amer: >60 mL/min (ref >60)
GFR calc non Af Amer: >60 mL/min (ref >60)
Glucose, Bld: 127 mg/dL — ABNORMAL HIGH (ref 70–99)
Potassium: 3 mmol/L — ABNORMAL LOW (ref 3.5–5.1)
Sodium: 139 mmol/L (ref 135–145)
Total Bilirubin: 0.6 mg/dL (ref 0.3–1.2)
Total Protein: 5.9 g/dL — ABNORMAL LOW (ref 6.5–8.1)

## 2020-03-21 LAB — BASIC METABOLIC PANEL
Anion gap: 9 (ref 5–15)
BUN: 11 mg/dL (ref 8–23)
CO2: 29 mmol/L (ref 22–32)
Calcium: 8.5 mg/dL — ABNORMAL LOW (ref 8.9–10.3)
Chloride: 101 mmol/L (ref 98–111)
Creatinine, Ser: 0.7 mg/dL (ref 0.44–1.00)
GFR calc Af Amer: >60 mL/min (ref >60)
GFR calc non Af Amer: >60 mL/min (ref >60)
Glucose, Bld: 133 mg/dL — ABNORMAL HIGH (ref 70–99)
Potassium: 2.9 mmol/L — ABNORMAL LOW (ref 3.5–5.1)
Sodium: 139 mmol/L (ref 135–145)

## 2020-03-21 MED ORDER — POTASSIUM CHLORIDE 10 MEQ/100ML IV SOLN
INTRAVENOUS | Status: AC
  Filled 2020-03-21: qty 100

## 2020-03-21 NOTE — ED Notes (Signed)
Patient transported to CT 

## 2020-03-21 NOTE — ED Notes (Signed)
Pt up to a chair in the room awake and talking. Pt switched to and admission bed for comfort. Denies any needs. Will continue to monitor.

## 2020-03-21 NOTE — Progress Notes (Addendum)
PROGRESS NOTE  [name] [id number] DOB: December 28, 1955 DOA: 03/20/2020 PCP: Libby Maw, MD  Hospital Course/Subjective: This is a 64 year old female with medical history significant for breast cancer with mets to brain, chronic leg edema with recurrent admissions for hypokalemia, GERD, chronic pain syndrome on methadone, oxycodone and gabapentin, chronic anemia/generalized weakness who presented to her oncologist office for chemotherapy 5/14 but was brought to the emergency department for admission and later admitted due to altered mental status and finding of hemorrhagic right frontal metastasis.  Assessment/Plan: Acute encephalopathy:possible seizure in the setting of brain mets. CT scan shows intralesional hemorrhage per neurosurgery, with whom the ED be discussed, less likely the etiology.  Admitting MD discussed with Dr. [name] patient's neuro oncologist- and will empirically put her on Keppra 500 mg twice daily, dexamethasone 4 mg daily keep on seizure precautions, check EEG, MRI brain with and without contrast shows results as below with known hemorrhage, no significant edema and no new mets.  Hold off on pain medication as it could contribute.  For neuro-oncology okay to admit on Aneth for now unless patient has recurrent seizure or has need for LTM.  She is more alert awake today doesn't remember much of yesterday at all. Since she is comfortable with no complaints of pain I will hold off her gabapentin, and Xanax and opiates for now, will let her have a diet since she is doing well.   Severe hypokalemia Much improved, she is getting more K this AM and will recheck in the morning.  UTI Abnormal UA, cx was obtained and patient empirically placed on Rocephin  Chronic leg edema on torsemide will hold torsemide for now  Essential hypertension: Pressure stable not on meds  Chronic pain: Holding pain medication due to her somnolence.  Will resume slowly when needed  but she is comfortable with no pain today.  Port catheter in place  Generalized weakness: Been having weakness for some time likely worsened by her hypokalemia.  PT eval  Goals of care:DNR as per discussion with patient and patient's daughter at the bedside.  She has a DNR form is scanned in the chart as well.  Overall prognosis does not appear bright patient and family understands.  DVT prophylaxis:  SCD Code Status: DNR as per discussion with patient and patient's daughter at the bedside.  She has a DNR form is scanned in the chart as well.  Objective: Vitals:   03/21/20 0730 03/21/20 0745 03/21/20 0800 03/21/20 0923  BP: (!) 104/58  (!) 103/59 104/63  Pulse: 60 61 (!) 58 (!) 59  Resp: 14 15 12 13   Temp:      SpO2: 98% 99% 98% 99%    Intake/Output Summary (Last 24 hours) at 03/21/2020 1056 Last data filed at 03/21/2020 0235 Gross per 24 hour  Intake 400 ml  Output --  Net 400 ml   There were no vitals filed for this visit.   Exam: General:  Alert, oriented, calm, in no acute distress, appears weak but pleasant and comfortable Eyes: EOMI, clear sclerea Neck: supple, no masses, trachea mildline  Cardiovascular: RRR, no murmurs or rubs, no peripheral edema  Respiratory: clear to auscultation bilaterally, no wheezes, no crackles  Abdomen: soft, nontender, nondistended, normal bowel tones heard  Skin: dry, no rashes  Musculoskeletal: no joint effusions, normal range of motion  Psychiatric: appropriate affect, normal speech  Neurologic: extraocular muscles intact, clear speech, moving all extremities with intact sensorium    Data Reviewed: CBC: Recent Labs  Lab 03/20/20 0919 03/20/20 1145 03/20/20 1208 03/21/20 0651  WBC 10.2 9.9  --  6.8  NEUTROABS 7.9* 7.7  --   --   HGB 9.4* 9.1* 9.9* 8.8*  HCT 30.1* 30.3* 29.0* 29.8*  MCV 89.6 90.7  --  91.7  PLT 277 283  --  [id number]   Basic Metabolic Panel: Recent Labs  Lab 03/20/20 0919 03/20/20 1145 03/20/20 1208  03/20/20 1651 03/21/20 0000 03/21/20 0651  NA 141 142 138  --  139 139  K 2.5* 2.2* 2.2*  --  2.9* 3.0*  CL 97* 99 94*  --  101 102  CO2 30 32  --   --  29 30  GLUCOSE 116* 116* 112*  --  133* 127*  BUN 18 18 17   --  11 10  CREATININE 1.11* 0.98 1.00  --  0.70 0.63  CALCIUM 9.2 9.1  --   --  8.5* 8.6*  MG  --   --   --  1.2*  --   --    GFR: Estimated Creatinine Clearance: 77.8 mL/min (by C-G formula based on SCr of 0.63 mg/dL). Liver Function Tests: Recent Labs  Lab 03/20/20 0919 03/20/20 1145 03/21/20 0651  AST 31 29 23   ALT 18 20 16   ALKPHOS 93 77 70  BILITOT 0.3 0.5 0.6  PROT 6.7 6.5 5.9*  ALBUMIN 2.9* 3.0* 2.6*   Recent Labs  Lab 03/20/20 1145  LIPASE 37   No results for input(s): AMMONIA in the last 168 hours. Coagulation Profile: Recent Labs  Lab 03/20/20 1145  INR 1.0   Cardiac Enzymes: No results for input(s): CKTOTAL, CKMB, CKMBINDEX, TROPONINI in the last 168 hours. BNP (last 3 results) No results for input(s): PROBNP in the last 8760 hours. HbA1C: No results for input(s): HGBA1C in the last 72 hours. CBG: Recent Labs  Lab 03/20/20 1050  GLUCAP 84   Lipid Profile: No results for input(s): CHOL, HDL, LDLCALC, TRIG, CHOLHDL, LDLDIRECT in the last 72 hours. Thyroid Function Tests: No results for input(s): TSH, T4TOTAL, FREET4, T3FREE, THYROIDAB in the last 72 hours. Anemia Panel: No results for input(s): VITAMINB12, FOLATE, FERRITIN, TIBC, IRON, RETICCTPCT in the last 72 hours. Urine analysis:    Component Value Date/Time   COLORURINE YELLOW 03/20/2020 1110   APPEARANCEUR HAZY (A) 03/20/2020 1110   LABSPEC 1.009 03/20/2020 1110   PHURINE 6.0 03/20/2020 1110   GLUCOSEU NEGATIVE 03/20/2020 1110   HGBUR NEGATIVE 03/20/2020 1110   BILIRUBINUR NEGATIVE 03/20/2020 1110   KETONESUR NEGATIVE 03/20/2020 1110   PROTEINUR NEGATIVE 03/20/2020 1110   UROBILINOGEN 0.2 12/29/2010 1427   NITRITE POSITIVE (A) 03/20/2020 1110   LEUKOCYTESUR LARGE (A)  03/20/2020 1110   Sepsis Labs: @LABRCNTIP (procalcitonin:4,lacticidven:4)  ) Recent Results (from the past 240 hour(s))  Culture, blood (Routine X 2) w Reflex to ID Panel     Status: None (Preliminary result)   Collection Time: 03/20/20 11:45 AM   Specimen: BLOOD  Result Value Ref Range Status   Specimen Description   Final    BLOOD LEFT CHEST PORTA CATH Performed at [facility], [place] [address] [place], [place] [address]    Special Requests   Final    BOTTLES DRAWN AEROBIC AND ANAEROBIC Blood Culture adequate volume Performed at [facility] [address] [place], [place] [address]    Culture   Final    NO GROWTH < 24 HOURS Performed at [facility] [address] [place], [place]  [address]    Report Status PENDING  Incomplete  Culture, blood (Routine X 2) w Reflex to ID Panel     Status: None (Preliminary result)   Collection Time: 03/20/20 11:45 AM   Specimen: BLOOD  Result Value Ref Range Status   Specimen Description   Final    BLOOD RIGHT CHEST PORTA CATH Performed at [facility] [address], [place], [place] [address]    Special Requests   Final    BOTTLES DRAWN AEROBIC AND ANAEROBIC Blood Culture adequate volume Performed at [facility] [address], [place], [place] [address]    Culture   Final    NO GROWTH < 24 HOURS Performed at [facility] [address] [place], [place] [address]    Report Status PENDING  Incomplete  SARS Coronavirus 2 by RT PCR (hospital order, performed in [facility] hospital lab) Nasopharyngeal Nasopharyngeal Swab     Status: None   Collection Time: 03/20/20 11:45 AM   Specimen: Nasopharyngeal Swab  Result Value Ref Range Status   SARS Coronavirus 2 NEGATIVE NEGATIVE Final    Comment: (NOTE) SARS-CoV-2 target nucleic acids are NOT DETECTED. The SARS-CoV-2 RNA is generally detectable in upper and lower respiratory specimens during  the acute phase of infection. The lowest concentration of SARS-CoV-2 viral copies this assay can detect is 250 copies / mL. A negative result does not preclude SARS-CoV-2 infection and should not be used as the sole basis for treatment or other patient management decisions.  A negative result may occur with improper specimen collection / handling, submission of specimen other than nasopharyngeal swab, presence of viral mutation(s) within the areas targeted by this assay, and inadequate number of viral copies (<250 copies / mL). A negative result must be combined with clinical observations, patient history, and epidemiological information. Fact Sheet for Patients:   StrictlyIdeas.no Fact Sheet for Healthcare Providers: BankingDealers.co.za This test is not yet approved or cleared  by the [place] FDA and has been authorized for detection and/or diagnosis of SARS-CoV-2 by FDA under an Emergency Use Authorization (EUA).  This EUA will remain in effect (meaning this test can be used) for the duration of the COVID-19 declaration under Section 564(b)(1) of the Act, 21 U.S.C. section 360bbb-3(b)(1), unless the authorization is terminated or revoked sooner. Performed at [facility], [place] [address], [place],  [address]      Studies: CT Head Wo Contrast  Result Date: 03/20/2020 CLINICAL DATA:  Altered mental status, metastatic breast cancer EXAM: CT HEAD WITHOUT CONTRAST TECHNIQUE: Contiguous axial images were obtained from the base of the skull through the vertex without intravenous contrast. COMPARISON:  02/10/2020 FINDINGS: Brain: There is new hyperdensity associated with the right frontal metastasis reflecting small volume intralesional hemorrhage. There is unchanged hypoattenuation in the right temporal lobe at the site of other known metastasis. There is no significant mass effect. Ventricles are stable in size and  configuration. No new loss of gray-white differentiation. Vascular: Negative. Skull: Calvarium is unremarkable. Sinuses/Orbits: No acute finding. Other: None. IMPRESSION: Small volume intralesional hemorrhage within treated right frontal metastasis. No significant mass effect. These results were called by telephone at the time of interpretation on 03/20/2020 at 12:25 pm to provider Fredia Sorrow , who verbally acknowledged these results. Electronically Signed   By: Macy Mis [name]   On: 03/20/2020 12:35   MR BRAIN W WO CONTRAST  Result Date: 03/20/2020 CLINICAL DATA:  Encephalopathy. Metastatic breast cancer. EXAM:  MRI HEAD WITHOUT AND WITH CONTRAST TECHNIQUE: Multiplanar, multiecho pulse sequences of the brain and surrounding structures were obtained without and with intravenous contrast. CONTRAST:  10 mL Gadavist COMPARISON:  Head CT 03/20/2020 and MRI 01/31/2020 FINDINGS: Multiple sequences are moderately to severely motion degraded. Brain: A heterogeneously enhancing right temporal lobe mass has not significantly changed in size from the prior MRI, measuring 4.9 x 2.6 x 2.1 cm (AP x transverse x craniocaudal) (previously 4.9 x 2.4 x 2.0 cm). There is persistent mild surrounding T2 hyperintensity/edema which is grossly similar to the prior MRI. Associated chronic blood products are noted. A hemorrhagic 2.7 x 2.5 cm right frontal mass has slightly increased in size although the amount of enhancement has decreased, and there is no significant edema. No new intracranial metastases are identified, however small lesions could easily be obscured by motion. No acute infarct, midline shift, or extra-axial fluid collection is identified. A mildly expanded partially empty sella is again noted. Mild cerebral atrophy is unchanged. Background mild chronic small vessel ischemia in the cerebral white matter is similar to the prior MRI. Vascular: Major intracranial vascular flow voids are preserved. Skull and upper  cervical spine: Chronically diminished bone marrow T1 signal intensity diffusely which may be related to the patient's known anemia. No grossly destructive skull lesion. Sinuses/Orbits: Unremarkable orbits. Clear paranasal sinuses. Increased, large left and moderate right mastoid effusions. Other: None. IMPRESSION: 1. Severely motion degraded examination. 2. Slightly increased size of hemorrhagic right frontal metastasis with decreased enhancement. No significant edema. 3. Unchanged right temporal metastasis. 4. No new intracranial metastases or acute infarct identified. 5. Increased size of left larger than right mastoid effusions. Electronically Signed   By: Logan Bores [name]   On: 03/20/2020 18:26   DG Chest Port 1 View  Result Date: 03/20/2020 CLINICAL DATA:  Altered mental status EXAM: PORTABLE CHEST 1 VIEW COMPARISON:  02/10/2020 FINDINGS: Left chest wall port catheter is unchanged. Low lung volumes. Mild interstitial prominence. No significant pleural effusion. No pneumothorax. Cardiomediastinal contours are likely within normal limits for technique. IMPRESSION: Low lung volumes. Nonspecific mild interstitial prominence, which could be chronic or reflect mild superimposed edema. Electronically Signed   By: Macy Mis [name]   On: 03/20/2020 12:07    Scheduled Meds: . alteplase  2 mg Intracatheter Once  . dexamethasone (DECADRON) injection  4 mg Intravenous Q24H  . pantoprazole (PROTONIX) IV  40 mg Intravenous QHS  . sertraline  100 mg Oral Daily  . tucatinib  150 mg Oral q morning - 10a    Continuous Infusions: . sodium chloride 75 mL/hr at 03/21/20 0204  . cefTRIAXone (ROCEPHIN)  IV Stopped (03/21/20 0130)  . levETIRAcetam 500 mg (03/21/20 0814)  . potassium chloride    . potassium chloride       LOS: 1 day   Time spent: 32 minutes  Timothy Trudell Marry Guan, MD [facility] Pager [phone]  If 7PM-7AM, please contact night-coverage [email or web address] Password  [facility] 03/21/2020, 10:56 AM

## 2020-03-21 NOTE — ED Notes (Signed)
Pt awake sitting up in bed talking and smiling. Soda given. Pt denies any other needs. Will continue to monitor.

## 2020-03-21 NOTE — ED Notes (Signed)
Pt sitting up in bed. Food tray given. Pt updated on plan of care. Denies any needs. Will continue to monitor.

## 2020-03-21 NOTE — ED Notes (Signed)
Pt lying in bed, eye's closed, chest rising and falling. Pt awakens easily. Denies any needs. Will continue to monitor.

## 2020-03-21 NOTE — ED Notes (Signed)
Husband would like to speak with the doctor and get an update on his wife.

## 2020-03-21 NOTE — Progress Notes (Signed)
Oral Chemotherapy Policy - Tukysa  A/P: per P&T policy, will hold Tukysa due to patient having active infection - Rocephin started for UTI. In addition, Tukysa causes hypokalemia at least 36% of the time as an Louanne Belton, PharmD, BCPS 03/21/2020 1:01 PM

## 2020-03-22 LAB — CBC
HCT: 29.5 % — ABNORMAL LOW (ref 36.0–46.0)
Hemoglobin: 9 g/dL — ABNORMAL LOW (ref 12.0–15.0)
MCH: 28 pg (ref 26.0–34.0)
MCHC: 30.5 g/dL (ref 30.0–36.0)
MCV: 91.6 fL (ref 80.0–100.0)
Platelets: 320 10*3/uL (ref 150–400)
RBC: 3.22 MIL/uL — ABNORMAL LOW (ref 3.87–5.11)
RDW: 16.4 % — ABNORMAL HIGH (ref 11.5–15.5)
WBC: 9 10*3/uL (ref 4.0–10.5)
nRBC: 0 % (ref 0.0–0.2)

## 2020-03-22 LAB — BASIC METABOLIC PANEL
Anion gap: 11 (ref 5–15)
BUN: 13 mg/dL (ref 8–23)
CO2: 28 mmol/L (ref 22–32)
Calcium: 9.2 mg/dL (ref 8.9–10.3)
Chloride: 104 mmol/L (ref 98–111)
Creatinine, Ser: 0.86 mg/dL (ref 0.44–1.00)
GFR calc Af Amer: >60 mL/min (ref >60)
GFR calc non Af Amer: >60 mL/min (ref >60)
Glucose, Bld: 121 mg/dL — ABNORMAL HIGH (ref 70–99)
Potassium: 2.8 mmol/L — ABNORMAL LOW (ref 3.5–5.1)
Sodium: 143 mmol/L (ref 135–145)

## 2020-03-22 MED ORDER — LORAZEPAM 2 MG/ML IJ SOLN
1.0000 mg | Freq: Once | INTRAMUSCULAR | Status: AC
  Administered 2020-03-22: 1 mg via INTRAVENOUS
  Filled 2020-03-22: qty 1

## 2020-03-22 MED ORDER — SODIUM CHLORIDE 0.9% FLUSH
10.0000 mL | Freq: Two times a day (BID) | INTRAVENOUS | Status: DC
  Administered 2020-03-23 (×2): 10 mL
  Administered 2020-03-24: 40 mL

## 2020-03-22 MED ORDER — POTASSIUM CHLORIDE CRYS ER 20 MEQ PO TBCR
40.0000 meq | EXTENDED_RELEASE_TABLET | ORAL | Status: AC
  Administered 2020-03-22 (×2): 40 meq via ORAL
  Filled 2020-03-22 (×2): qty 2

## 2020-03-22 MED ORDER — CHLORHEXIDINE GLUCONATE CLOTH 2 % EX PADS
6.0000 | MEDICATED_PAD | Freq: Every day | CUTANEOUS | Status: DC
  Administered 2020-03-22: 6 via TOPICAL

## 2020-03-22 MED ORDER — HALOPERIDOL LACTATE 5 MG/ML IJ SOLN
2.0000 mg | Freq: Four times a day (QID) | INTRAMUSCULAR | Status: DC | PRN
  Administered 2020-03-22: 2 mg via INTRAMUSCULAR
  Filled 2020-03-22: qty 1

## 2020-03-22 MED ORDER — SODIUM CHLORIDE 0.9% FLUSH
10.0000 mL | INTRAVENOUS | Status: DC | PRN
  Administered 2020-03-24: 10 mL

## 2020-03-22 MED ORDER — DEXAMETHASONE SODIUM PHOSPHATE 4 MG/ML IJ SOLN
2.0000 mg | INTRAMUSCULAR | Status: DC
  Administered 2020-03-22 – 2020-03-23 (×2): 2 mg via INTRAVENOUS
  Filled 2020-03-22 (×2): qty 1

## 2020-03-22 MED ORDER — OXYCODONE HCL 5 MG PO TABS
5.0000 mg | ORAL_TABLET | ORAL | Status: DC | PRN
  Administered 2020-03-23 – 2020-03-24 (×6): 5 mg via ORAL
  Filled 2020-03-22 (×6): qty 1

## 2020-03-22 MED ORDER — GABAPENTIN 100 MG PO CAPS
100.0000 mg | ORAL_CAPSULE | Freq: Two times a day (BID) | ORAL | Status: DC
  Administered 2020-03-22 – 2020-03-24 (×5): 100 mg via ORAL
  Filled 2020-03-22 (×5): qty 1

## 2020-03-22 NOTE — ED Notes (Addendum)
This RN walked in to round on patient. Patient found on bedside commode trying to change into her clothes stating, " I am leaving, get the doctor I want to sign myself out". Patient refusing to get back in bed.   Md paged and made aware.   Sitter order placed.

## 2020-03-22 NOTE — ED Notes (Signed)
Pt refused vitals. RN notified

## 2020-03-22 NOTE — ED Notes (Signed)
Patient eating meal tray at this time.   Husband and sitter at bedside.

## 2020-03-22 NOTE — ED Notes (Signed)
Pt lying in bed awake. Pt asking for her belongings. Pt showing signs of confusion as she doesn't remember me from earlier. Will continue to monitor.

## 2020-03-22 NOTE — ED Notes (Signed)
Hollice Gong, MD made aware of low potassium. New orders placed.

## 2020-03-22 NOTE — ED Notes (Signed)
Patient's husband at bedside. Patient cursing and screaming at staff stating she wants to leave. Patient threatening to rip out her port. RN calmly explained to patient why she needs care again and she is still agitated. Hospitalist paged at this time.

## 2020-03-22 NOTE — ED Notes (Signed)
Pt lying in bed, eye's closed, chest rising and falling. NAD noted. Will continue to monitor.  

## 2020-03-22 NOTE — ED Notes (Signed)
Husband at bedside. Patient trying to leave. MD aware and new PRN orders placed.

## 2020-03-22 NOTE — ED Notes (Signed)
Pt lying in bed awake. Pt confused, but cooperative. Attempted to reorient. Will continue to monitor pt.

## 2020-03-22 NOTE — ED Notes (Signed)
RN called patient's family and spoke to daughter and husband. Patient has been increasingly agitated and attempting to leave. Patient's husband said he will be here shortly to speak with her regarding the need to stay and get treatment. Patient's family understanding and informed that we do not wish to make her feel forced in any way but want her to get appropriate care and she needs to stay in the hospital for that.

## 2020-03-22 NOTE — Progress Notes (Signed)
PROGRESS NOTE  [name] [id number] DOB: 04/04/1956 DOA: 03/20/2020 PCP: Libby Maw, MD  Hospital Course/Subjective: This is a 64 year old female with medical history significant for breast cancer with mets to brain, chronic leg edema with recurrent admissions for hypokalemia, GERD, chronic pain syndrome on methadone, oxycodone and gabapentin, chronic anemia/generalized weakness who presented to her oncologist office for chemotherapy 5/14 but was brought to the emergency department for admission and later admitted due to altered mental status and finding of hemorrhagic right frontal metastasis. On the morning of 5/15, patient alert and oriented with no complaints, though she was unable to recall how she ended up in ER. We discussed finding of hemorrhage in her frontal metastasis and she was started on Keppra and Dexamethasone.   This morning, she is much more confused, wants to go home. I discussed with neurology on call Dr. Leonel Ramsay who reviewed MRI. Agrees with EEG, Keppra, recommends reducing steroid dose as her confusion this morning is very likely delirium.  Assessment/Plan: Acute encephalopathy:possible seizure in the setting of brain mets. CT scan shows intralesional hemorrhage per neurosurgery, with whom the ED be discussed, less likely the etiology.  Could also be metabolic from her UTI. Admitting MD discussed with Dr. [name] patient's neuro oncologist- and will empirically put her on Keppra 500 mg twice daily, dexamethasone 4 mg daily keep on seizure precautions, check EEG, MRI brain with and without contrast shows results as below with known hemorrhage, no significant edema and no new mets.  Hold off on pain medication as it could contribute.  For neuro-oncology okay to admit on [place] for now unless patient has recurrent seizure or has need for LTM.  She is more alert awake today but also confused, likely delirium multifactorial. - continue Keppra, await EEG -  diet advanced - bedside sitter for safety - reduce Dex to 2mg  IV daily - I will cautiously resume some home pain/anxiety meds and she may be withdrawing as well - no stroke like findings which would prompt me to re-image her brain  Severe hypokalemia Much improved, AM labs pending.  UTI Abnormal UA, cx was obtained and patient empirically placed on Rocephin  Chronic leg edema on torsemide will hold torsemide for now  Essential hypertension: Pressure stable not on meds  Chronic pain: Holding pain medication due to her somnolence.  Will resume slowly now as she is possibly withdrawing as well.  Port catheter in place  Generalized weakness: Been having weakness for some time likely worsened by her hypokalemia.  PT eval  Goals of care:DNR as per discussion with patient and patient's daughter at the bedside.  She has a DNR form is scanned in the chart as well.  Overall prognosis does not appear bright patient and family understands.  DVT prophylaxis:  SCD Code Status: DNR as per discussion with patient and patient's daughter at the bedside by admitting MD.  She has a DNR form is scanned in the chart as well.  Objective: Vitals:   03/22/20 0528 03/22/20 0600 03/22/20 0728 03/22/20 0730  BP: 106/65 109/67 95/82 113/72  Pulse: 73 71 67 (!) 58  Resp: 19 (!) 21  19  Temp:      SpO2: 97% 95% 98% 96%    Intake/Output Summary (Last 24 hours) at 03/22/2020 0833 Last data filed at 03/22/2020 0006 Gross per 24 hour  Intake 290.76 ml  Output --  Net 290.76 ml   There were no vitals filed for this visit.   Exam: General:  Alert, confused, in no acute distress, appears weak but pleasant and comfortable Eyes: EOMI, clear sclerea Neck: supple, no masses, trachea mildline  Cardiovascular: RRR, no murmurs or rubs, no peripheral edema  Respiratory: clear to auscultation bilaterally, no wheezes, no crackles  Abdomen: soft, nontender, nondistended, normal bowel tones heard  Skin: dry,  no rashes  Musculoskeletal: no joint effusions, normal range of motion  Psychiatric: appropriate affect, normal speech  Neurologic: extraocular muscles intact, clear speech, moving all extremities with intact sensorium    Data Reviewed: CBC: Recent Labs  Lab 03/20/20 0919 03/20/20 1145 03/20/20 1208 03/21/20 0651  WBC 10.2 9.9  --  6.8  NEUTROABS 7.9* 7.7  --   --   HGB 9.4* 9.1* 9.9* 8.8*  HCT 30.1* 30.3* 29.0* 29.8*  MCV 89.6 90.7  --  91.7  PLT 277 283  --  [id number]   Basic Metabolic Panel: Recent Labs  Lab 03/20/20 0919 03/20/20 1145 03/20/20 1208 03/20/20 1651 03/21/20 0000 03/21/20 0651  NA 141 142 138  --  139 139  K 2.5* 2.2* 2.2*  --  2.9* 3.0*  CL 97* 99 94*  --  101 102  CO2 30 32  --   --  29 30  GLUCOSE 116* 116* 112*  --  133* 127*  BUN 18 18 17   --  11 10  CREATININE 1.11* 0.98 1.00  --  0.70 0.63  CALCIUM 9.2 9.1  --   --  8.5* 8.6*  MG  --   --   --  1.2*  --   --    GFR: Estimated Creatinine Clearance: 77.8 mL/min (by C-G formula based on SCr of 0.63 mg/dL). Liver Function Tests: Recent Labs  Lab 03/20/20 0919 03/20/20 1145 03/21/20 0651  AST 31 29 23   ALT 18 20 16   ALKPHOS 93 77 70  BILITOT 0.3 0.5 0.6  PROT 6.7 6.5 5.9*  ALBUMIN 2.9* 3.0* 2.6*   Recent Labs  Lab 03/20/20 1145  LIPASE 37   No results for input(s): AMMONIA in the last 168 hours. Coagulation Profile: Recent Labs  Lab 03/20/20 1145  INR 1.0   Cardiac Enzymes: No results for input(s): CKTOTAL, CKMB, CKMBINDEX, TROPONINI in the last 168 hours. BNP (last 3 results) No results for input(s): PROBNP in the last 8760 hours. HbA1C: No results for input(s): HGBA1C in the last 72 hours. CBG: Recent Labs  Lab 03/20/20 1050  GLUCAP 84   Lipid Profile: No results for input(s): CHOL, HDL, LDLCALC, TRIG, CHOLHDL, LDLDIRECT in the last 72 hours. Thyroid Function Tests: No results for input(s): TSH, T4TOTAL, FREET4, T3FREE, THYROIDAB in the last 72 hours. Anemia Panel: No  results for input(s): VITAMINB12, FOLATE, FERRITIN, TIBC, IRON, RETICCTPCT in the last 72 hours. Urine analysis:    Component Value Date/Time   COLORURINE YELLOW 03/20/2020 1110   APPEARANCEUR HAZY (A) 03/20/2020 1110   LABSPEC 1.009 03/20/2020 1110   PHURINE 6.0 03/20/2020 1110   GLUCOSEU NEGATIVE 03/20/2020 1110   HGBUR NEGATIVE 03/20/2020 1110   BILIRUBINUR NEGATIVE 03/20/2020 1110   KETONESUR NEGATIVE 03/20/2020 1110   PROTEINUR NEGATIVE 03/20/2020 1110   UROBILINOGEN 0.2 12/29/2010 1427   NITRITE POSITIVE (A) 03/20/2020 1110   LEUKOCYTESUR LARGE (A) 03/20/2020 1110   Sepsis Labs: @LABRCNTIP (procalcitonin:4,lacticidven:4)  ) Recent Results (from the past 240 hour(s))  Culture, blood (Routine X 2) w Reflex to ID Panel     Status: None (Preliminary result)   Collection Time: 03/20/20 11:45 AM   Specimen: BLOOD  Result Value Ref Range Status   Specimen Description   Final    BLOOD LEFT CHEST PORTA CATH Performed at [facility] [address] [place], [name] [address]    Special Requests   Final    BOTTLES DRAWN AEROBIC AND ANAEROBIC Blood Culture adequate volume Performed at [facility] [address], [place], [place] [address]    Culture   Final    NO GROWTH 2 DAYS Performed at [facility] [address], [place], [place] [address]    Report Status PENDING  Incomplete  Culture, blood (Routine X 2) w Reflex to ID Panel     Status: None (Preliminary result)   Collection Time: 03/20/20 11:45 AM   Specimen: BLOOD  Result Value Ref Range Status   Specimen Description   Final    BLOOD RIGHT CHEST PORTA CATH Performed at [facility] [address], [place], [place] [address]    Special Requests   Final    BOTTLES DRAWN AEROBIC AND ANAEROBIC Blood Culture adequate volume Performed at [facility] [address] [place], [place] [address]    Culture   Final    NO GROWTH 2  DAYS Performed at [facility] [address], [place], [place] [address]    Report Status PENDING  Incomplete  SARS Coronavirus 2 by RT PCR (hospital order, performed in [facility] hospital lab) Nasopharyngeal Nasopharyngeal Swab     Status: None   Collection Time: 03/20/20 11:45 AM   Specimen: Nasopharyngeal Swab  Result Value Ref Range Status   SARS Coronavirus 2 NEGATIVE NEGATIVE Final    Comment: (NOTE) SARS-CoV-2 target nucleic acids are NOT DETECTED. The SARS-CoV-2 RNA is generally detectable in upper and lower respiratory specimens during the acute phase of infection. The lowest concentration of SARS-CoV-2 viral copies this assay can detect is 250 copies / mL. A negative result does not preclude SARS-CoV-2 infection and should not be used as the sole basis for treatment or other patient management decisions.  A negative result may occur with improper specimen collection / handling, submission of specimen other than nasopharyngeal swab, presence of viral mutation(s) within the areas targeted by this assay, and inadequate number of viral copies (<250 copies / mL). A negative result must be combined with clinical observations, patient history, and epidemiological information. Fact Sheet for Patients:   StrictlyIdeas.no Fact Sheet for Healthcare Providers: BankingDealers.co.za This test is not yet approved or cleared  by the [place] FDA and has been authorized for detection and/or diagnosis of SARS-CoV-2 by FDA under an Emergency Use Authorization (EUA).  This EUA will remain in effect (meaning this test can be used) for the duration of the COVID-19 declaration under Section 564(b)(1) of the Act, 21 U.S.C. section 360bbb-3(b)(1), unless the authorization is terminated or revoked sooner. Performed at [facility], [place] [address] [place] [address], [name], [place] [address]   Culture, Urine     Status: Abnormal  (Preliminary result)   Collection Time: 03/20/20 10:28 PM   Specimen: Urine, Random  Result Value Ref Range Status   Specimen Description   Final    URINE, RANDOM Performed at [facility] [address] [place], [place] [address]    Special Requests   Final    NONE Performed at [facility], [place] [address] [place],  [address]    Culture >=100,000 COLONIES/mL GRAM NEGATIVE RODS (A)  Final   Report  Status PENDING  Incomplete     Studies: No results found.  Scheduled Meds: . alteplase  2 mg Intracatheter Once  . dexamethasone (DECADRON) injection  2 mg Intravenous Q24H  . pantoprazole (PROTONIX) IV  40 mg Intravenous QHS  . sertraline  100 mg Oral Daily    Continuous Infusions: . sodium chloride 75 mL/hr at 03/21/20 0204  . cefTRIAXone (ROCEPHIN)  IV Stopped (03/22/20 0006)  . levETIRAcetam 500 mg (03/22/20 0829)  . potassium chloride       LOS: 2 days   Time spent: 32 minutes  [name] Marry Guan, MD [facility] Pager [phone]  If 7PM-7AM, please contact night-coverage [email or web address] Password [facility] 03/22/2020, 8:33 AM

## 2020-03-22 NOTE — ED Notes (Signed)
Patient consistently trying to leave. Patient redirected multiple times. Sitter at bedside. Md aware.

## 2020-03-22 NOTE — ED Notes (Signed)
Renaee Munda, MD at bedside.   Sitter at bedside.

## 2020-03-23 ENCOUNTER — Inpatient Hospital Stay (HOSPITAL_COMMUNITY)
Admission: EM | Admit: 2020-03-23 | Discharge: 2020-03-23 | Disposition: A | Discharge status: Home / Self Care | Payer: Medicare Other | Source: Home / Self Care | Attending: Internal Medicine | Admitting: Internal Medicine

## 2020-03-23 ENCOUNTER — Inpatient Hospital Stay (HOSPITAL_COMMUNITY): Payer: Medicare Other

## 2020-03-23 ENCOUNTER — Telehealth: Payer: Self-pay | Admitting: Oncology

## 2020-03-23 DIAGNOSIS — C50211 Malignant neoplasm of upper-inner quadrant of right female breast: Secondary | ICD-10-CM

## 2020-03-23 DIAGNOSIS — Z923 Personal history of irradiation: Secondary | ICD-10-CM

## 2020-03-23 DIAGNOSIS — M7989 Other specified soft tissue disorders: Secondary | ICD-10-CM

## 2020-03-23 DIAGNOSIS — E876 Hypokalemia: Secondary | ICD-10-CM

## 2020-03-23 DIAGNOSIS — R4182 Altered mental status, unspecified: Secondary | ICD-10-CM

## 2020-03-23 DIAGNOSIS — Z86718 Personal history of other venous thrombosis and embolism: Secondary | ICD-10-CM

## 2020-03-23 DIAGNOSIS — Z9221 Personal history of antineoplastic chemotherapy: Secondary | ICD-10-CM

## 2020-03-23 DIAGNOSIS — Z170 Estrogen receptor positive status [ER+]: Secondary | ICD-10-CM

## 2020-03-23 DIAGNOSIS — C7931 Secondary malignant neoplasm of brain: Principal | ICD-10-CM

## 2020-03-23 LAB — BASIC METABOLIC PANEL
Anion gap: 7 (ref 5–15)
BUN: 11 mg/dL (ref 8–23)
CO2: 28 mmol/L (ref 22–32)
Calcium: 8.8 mg/dL — ABNORMAL LOW (ref 8.9–10.3)
Chloride: 110 mmol/L (ref 98–111)
Creatinine, Ser: 0.67 mg/dL (ref 0.44–1.00)
GFR calc Af Amer: >60 mL/min (ref >60)
GFR calc non Af Amer: >60 mL/min (ref >60)
Glucose, Bld: 92 mg/dL (ref 70–99)
Potassium: 3.3 mmol/L — ABNORMAL LOW (ref 3.5–5.1)
Sodium: 145 mmol/L (ref 135–145)

## 2020-03-23 LAB — URINE CULTURE: Culture: >=100000 — AB

## 2020-03-23 LAB — CBC
HCT: 28.1 % — ABNORMAL LOW (ref 36.0–46.0)
Hemoglobin: 8.3 g/dL — ABNORMAL LOW (ref 12.0–15.0)
MCH: 27.4 pg (ref 26.0–34.0)
MCHC: 29.5 g/dL — ABNORMAL LOW (ref 30.0–36.0)
MCV: 92.7 fL (ref 80.0–100.0)
Platelets: 296 10*3/uL (ref 150–400)
RBC: 3.03 MIL/uL — ABNORMAL LOW (ref 3.87–5.11)
RDW: 16.9 % — ABNORMAL HIGH (ref 11.5–15.5)
WBC: 7.8 10*3/uL (ref 4.0–10.5)
nRBC: 0 % (ref 0.0–0.2)

## 2020-03-23 LAB — MAGNESIUM: Magnesium: 1.2 mg/dL — ABNORMAL LOW (ref 1.7–2.4)

## 2020-03-23 LAB — MRSA PCR SCREENING: MRSA by PCR: POSITIVE — AB

## 2020-03-23 MED ORDER — CHLORHEXIDINE GLUCONATE CLOTH 2 % EX PADS
6.0000 | MEDICATED_PAD | Freq: Every day | CUTANEOUS | Status: DC
  Administered 2020-03-23: 6 via TOPICAL

## 2020-03-23 MED ORDER — MAGNESIUM SULFATE 2 GM/50ML IV SOLN
2.0000 g | Freq: Once | INTRAVENOUS | Status: AC
  Administered 2020-03-23: 2 g via INTRAVENOUS
  Filled 2020-03-23: qty 50

## 2020-03-23 MED ORDER — POTASSIUM CHLORIDE CRYS ER 20 MEQ PO TBCR
40.0000 meq | EXTENDED_RELEASE_TABLET | ORAL | Status: AC
  Administered 2020-03-23 (×2): 40 meq via ORAL
  Filled 2020-03-23 (×2): qty 2

## 2020-03-23 MED ORDER — MUPIROCIN 2 % EX OINT
1.0000 "application " | TOPICAL_OINTMENT | Freq: Two times a day (BID) | CUTANEOUS | Status: DC
  Administered 2020-03-23 – 2020-03-24 (×4): 1 via NASAL
  Filled 2020-03-23: qty 22

## 2020-03-23 MED ORDER — TORSEMIDE 10 MG PO TABS
10.0000 mg | ORAL_TABLET | Freq: Every day | ORAL | Status: DC
  Administered 2020-03-23 – 2020-03-24 (×2): 10 mg via ORAL
  Filled 2020-03-23 (×3): qty 1

## 2020-03-23 NOTE — Progress Notes (Signed)
EEG complete - results pending 

## 2020-03-23 NOTE — TOC Initial Note (Signed)
Transition of Care [facility]) - Initial/Assessment Note    Patient Details  Name: [name] MRN: [id number] Date of Birth: 04/25/1956  Transition of [facility] CM/SW Contact:    Leeroy Cha, RN Phone Number: 03/23/2020, 10:10 AM  Clinical Narrative:                 Patient with new onset of confusion.  Urine culture + E.Coli, bld cultures x2x3d=neg. Iv decadron, iv ro0cephin, iv keppra.  Expected Discharge Plan: Home/Self Care Barriers to Discharge: Continued Medical Work up   Patient Goals and CMS Choice Patient states their goals for this hospitalization and ongoing recovery are:: to go back home CMS Medicare.gov Compare Post Acute Care list provided to:: Patient    Expected Discharge Plan and Services Expected Discharge Plan: Home/Self Care       Living arrangements for the past 2 months: Single Family Home Expected Discharge Date: (unknown)                                    Prior Living Arrangements/Services Living arrangements for the past 2 months: Single Family Home Lives with:: Spouse, Adult Children Patient language and need for interpreter reviewed:: No        Need for Family Participation in Patient Care: Yes (Comment) Care giver support system in place?: Yes (comment)   Criminal Activity/Legal Involvement Pertinent to Current Situation/Hospitalization: No - Comment as needed  Activities of Daily Living Home Assistive Devices/Equipment: Cane (specify quad or straight), Walker (specify type)(single point cane, front wheeled walker) ADL Screening (condition at time of admission) Patient's cognitive ability adequate to safely complete daily activities?: No Is the patient deaf or have difficulty hearing?: No Does the patient have difficulty seeing, even when wearing glasses/contacts?: No Does the patient have difficulty concentrating, remembering, or making decisions?: Yes Patient able to express need for assistance with ADLs?: No Does the  patient have difficulty dressing or bathing?: Yes Independently performs ADLs?: No Communication: Independent Dressing (OT): Needs assistance Is this a change from baseline?: Change from baseline, expected to last >3 days Grooming: Needs assistance Is this a change from baseline?: Change from baseline, expected to last >3 days Feeding: Needs assistance Is this a change from baseline?: Change from baseline, expected to last >3 days Bathing: Needs assistance Is this a change from baseline?: Change from baseline, expected to last >3 days Toileting: Needs assistance Is this a change from baseline?: Change from baseline, expected to last >3days In/Out Bed: Needs assistance Is this a change from baseline?: Change from baseline, expected to last >3 days Walks in Home: Needs assistance Is this a change from baseline?: Change from baseline, expected to last >3 days Does the patient have difficulty walking or climbing stairs?: Yes Weakness of Legs: Both Weakness of Arms/Hands: Both  Permission Sought/Granted                  Emotional Assessment Appearance:: Appears stated age     Orientation: : Oriented to Self, Oriented to Place, Oriented to  Time, Oriented to Situation Alcohol / Substance Use: Not Applicable Psych Involvement: No (comment)  Admission diagnosis:  Hypokalemia [E87.6] Acute encephalopathy [G93.40] Altered mental status, unspecified altered mental status type [R41.82] Patient Active Problem List   Diagnosis Date Noted  . Cancer of right breast metastatic to brain ([place]) 03/16/2020  . Nausea & vomiting 02/29/2020  . UTI (urinary tract infection) 02/11/2020  .  Acute metabolic encephalopathy [id number]  . Generalized weakness 02/11/2020  . Hypokalemia 02/11/2020  . Paresis of left lower extremity ([place]) 02/11/2020  . Paresis of right lower extremity ([place]) 02/11/2020  . Drug-induced polyneuropathy ([place]) 12/19/2019  . Acute respiratory failure with hypoxia ([place])  11/19/2019  . COVID-19 virus infection 11/19/2019  . Overdose opiate, accidental or unintentional, initial encounter ([place]) 11/19/2019  . Acute encephalopathy 11/19/2019  . Metastasis to brain ([place]) 09/13/2019  . Acute lower UTI 02/06/2018  . Altered mental status 02/06/2018  . Bacteremia 02/06/2018  . Polypharmacy 02/06/2018  . Goals of care, counseling/discussion 09/05/2017  . Pneumonia 01/25/2017  . CAP (community acquired pneumonia) 01/23/2017  . Port catheter in place 05/30/2016  . 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 Chronic pain 04/13/2015  . Left arm pain 08/18/2014  . Malignant neoplasm of upper-inner quadrant of right breast in female, estrogen receptor positive ([place]) 10/03/2013  . Post-lymphadenectomy lymphedema of arm 08/15/2013  . Port or reservoir infection 01/29/2013  . DVT (deep venous thrombosis) ([place]) 10/07/2011  . Chest pain 09/10/2009  . Secondary cardiomyopathy ([name]) 09/02/2009  . Essential hypertension 02/26/2009  . GERD 02/26/2009   PCP:  Libby Maw, MD Pharmacy:   [id number] - [place], [place] [id number] [place] [id number] Phone: [phone] Fax: [phone]  [place], [place] [address] [place] [address] Phone: [phone] Fax: [phone]  [facility] [place], [name] [address] Phone: [phone] Fax: [phone]     Social Determinants of Health (SDOH) Interventions    Readmission Risk Interventions Readmission Risk Prevention Plan 03/02/2020  Transportation Screening Complete  Medication Review ([place]) Complete  PCP or Specialist appointment within 3-5 days of discharge Complete  HRI or [place] Complete  SW Recovery  Care/Counseling Consult Complete  [place] Not Applicable  Some recent data might be hidden

## 2020-03-23 NOTE — Progress Notes (Signed)
PROGRESS NOTE  [name] [id number] DOB: November 12, 1955 DOA: 03/20/2020 PCP: Libby Maw, MD  Hospital Course/Subjective: This is a 64 year old female with medical history significant for breast cancer with mets to brain, chronic leg edema with recurrent admissions for hypokalemia, GERD, chronic pain syndrome on methadone, oxycodone and gabapentin, chronic anemia/generalized weakness who presented to her oncologist office for chemotherapy 5/14 but was brought to the emergency department for admission and later admitted due to altered mental status and finding of hemorrhagic right frontal metastasis. On the morning of 5/15, patient alert and oriented with no complaints, though she was unable to recall how she ended up in ER. We discussed finding of hemorrhage in her frontal metastasis and she was started on Keppra and Dexamethasone.   5/16 she was much more confused, wants to go home. I discussed with neurology on call Dr. Leonel Ramsay who reviewed MRI. Agrees with EEG, Keppra, recommends reducing steroid dose as her confusion this morning is very likely delirium. She had a better day after getting a dose of Haldol, steroid dose was reduced.   This morning 5/17, the patient is awake alert comfortable, sitter is at the bedside. Patient with no complaints although her right arm is swollen and she tells me this is chronic for her and she normally wears a compression sleeve, as she has a history of DVT in that arm.  Assessment/Plan: Acute encephalopathy:possible seizure in the setting of brain mets. CT scan shows intralesional hemorrhage per neurosurgery, with whom the ED be discussed, less likely the etiology of her AMS.  Could also be metabolic from her UTI. Admitting MD discussed with Dr. [name] patient's neuro oncologist- and will empirically put her on Keppra 500 mg twice daily, dexamethasone keep on seizure precautions, check EEG, MRI brain with and without contrast shows no significant  edema and no new mets.  Hold off on pain medication as it could contribute.  For neuro-oncology okay to admit on [place] for now unless patient has recurrent seizure or has need for LTM.  - continue Keppra, await EEG today - diet advanced - bedside sitter for safety - reduced Dex to 2mg  IV daily on 5/16 and have seen some improvement in her AMS - I will cautiously resume some home pain/anxiety meds and she may be withdrawing as well - no stroke like findings which would prompt me to re-image her brain  Severe hypokalemia Much improved, AM labs pending.  Hypomagnesemia being repleted today as well.  UTI Abnormal UA, cx was obtained and patient empirically placed on Rocephin, follow UCx  Chronic leg edema on torsemide which I will resume today  Essential hypertension: Pressure stable not on meds  Chronic pain: Holding pain medication due to her somnolence.  Will resume slowly now as she is possibly withdrawing as well.  Port catheter in place  Generalized weakness: Been having weakness for some time likely worsened by her hypokalemia.  PT eval  Family communication: I discussed her delirium, possible etiologies, as well as plan of care with her husband extensively on the phone 5/16.  DVT prophylaxis:  SCD Code Status: DNR as per discussion with patient and patient's daughter at the bedside by admitting MD.  She has a DNR form is scanned in the chart as well.  Objective: Vitals:   03/23/20 0300 03/23/20 0400 03/23/20 0500 03/23/20 0600  BP: (!) 135/98 (!) 125/58 (!) 137/57 (!) 154/57  Pulse: (!) 42 (!) 43 (!) 44 (!) 50  Resp: (!) 22 17 19  19  Temp: 97.6 F (36.4 C)     TempSrc: Oral     SpO2: 97% 98% 95% 96%  Weight:      Height:        Intake/Output Summary (Last 24 hours) at 03/23/2020 0753 Last data filed at 03/22/2020 1938 Gross per 24 hour  Intake 1470 ml  Output --  Net 1470 ml   Filed Weights   03/22/20 2205 03/22/20 2238  Weight: 99.4 kg 99.4 kg      Exam: General:  Alert, oriented, in no acute distress, appears stronger more alert, pleasant and comfortable Eyes: EOMI, clear sclerea Neck: supple, no masses, trachea mildline  Cardiovascular: RRR, no murmurs or rubs, no peripheral edema  Respiratory: clear to auscultation bilaterally, no wheezes, no crackles  Abdomen: soft, nontender, nondistended, normal bowel tones heard  Skin: dry, no rashes  Musculoskeletal: no joint effusions, normal range of motion  Psychiatric: appropriate affect, normal speech  Neurologic: extraocular muscles intact, clear speech, moving all extremities with intact sensorium    Data Reviewed: CBC: Recent Labs  Lab 03/20/20 0919 03/20/20 0919 03/20/20 1145 03/20/20 1208 03/21/20 0651 03/22/20 0821 03/23/20 0530  WBC 10.2  --  9.9  --  6.8 9.0 7.8  NEUTROABS 7.9*  --  7.7  --   --   --   --   HGB 9.4*  --  9.1* 9.9* 8.8* 9.0* 8.3*  HCT 30.1*   < > 30.3* 29.0* 29.8* 29.5* 28.1*  MCV 89.6  --  90.7  --  91.7 91.6 92.7  PLT 277  --  283  --  276 320 296   < > = values in this interval not displayed.   Basic Metabolic Panel: Recent Labs  Lab 03/20/20 1145 03/20/20 1145 03/20/20 1208 03/20/20 1651 03/21/20 0000 03/21/20 0651 03/22/20 0821 03/23/20 0530  NA 142   < > 138  --  139 139 143 145  K 2.2*   < > 2.2*  --  2.9* 3.0* 2.8* 3.3*  CL 99   < > 94*  --  101 102 104 110  CO2 32  --   --   --  29 30 28 28   GLUCOSE 116*   < > 112*  --  133* 127* 121* 92  BUN 18   < > 17  --  11 10 13 11   CREATININE 0.98   < > 1.00  --  0.70 0.63 0.86 0.67  CALCIUM 9.1  --   --   --  8.5* 8.6* 9.2 8.8*  MG  --   --   --  1.2*  --   --   --  1.2*   < > = values in this interval not displayed.   GFR: Estimated Creatinine Clearance: 81.4 mL/min (by C-G formula based on SCr of 0.67 mg/dL). Liver Function Tests: Recent Labs  Lab 03/20/20 0919 03/20/20 1145 03/21/20 0651  AST 31 29 23   ALT 18 20 16   ALKPHOS 93 77 70  BILITOT 0.3 0.5 0.6  PROT 6.7  6.5 5.9*  ALBUMIN 2.9* 3.0* 2.6*   Recent Labs  Lab 03/20/20 1145  LIPASE 37   No results for input(s): AMMONIA in the last 168 hours. Coagulation Profile: Recent Labs  Lab 03/20/20 1145  INR 1.0   Cardiac Enzymes: No results for input(s): CKTOTAL, CKMB, CKMBINDEX, TROPONINI in the last 168 hours. BNP (last 3 results) No results for input(s): PROBNP in the last 8760 hours. HbA1C: No results for input(s):  HGBA1C in the last 72 hours. CBG: Recent Labs  Lab 03/20/20 1050  GLUCAP 84   Lipid Profile: No results for input(s): CHOL, HDL, LDLCALC, TRIG, CHOLHDL, LDLDIRECT in the last 72 hours. Thyroid Function Tests: No results for input(s): TSH, T4TOTAL, FREET4, T3FREE, THYROIDAB in the last 72 hours. Anemia Panel: No results for input(s): VITAMINB12, FOLATE, FERRITIN, TIBC, IRON, RETICCTPCT in the last 72 hours. Urine analysis:    Component Value Date/Time   COLORURINE YELLOW 03/20/2020 1110   APPEARANCEUR HAZY (A) 03/20/2020 1110   LABSPEC 1.009 03/20/2020 1110   PHURINE 6.0 03/20/2020 1110   GLUCOSEU NEGATIVE 03/20/2020 1110   HGBUR NEGATIVE 03/20/2020 1110   BILIRUBINUR NEGATIVE 03/20/2020 1110   KETONESUR NEGATIVE 03/20/2020 1110   PROTEINUR NEGATIVE 03/20/2020 1110   UROBILINOGEN 0.2 12/29/2010 1427   NITRITE POSITIVE (A) 03/20/2020 1110   LEUKOCYTESUR LARGE (A) 03/20/2020 1110   Sepsis Labs: @LABRCNTIP (procalcitonin:4,lacticidven:4)  ) Recent Results (from the past 240 hour(s))  Culture, blood (Routine X 2) w Reflex to ID Panel     Status: None (Preliminary result)   Collection Time: 03/20/20 11:45 AM   Specimen: BLOOD  Result Value Ref Range Status   Specimen Description   Final    BLOOD LEFT CHEST PORTA CATH Performed at [facility], [place] [address], [place], [place] [address]    Special Requests   Final    BOTTLES DRAWN AEROBIC AND ANAEROBIC Blood Culture adequate volume Performed at [facility] [address] [place], [place] [address]    Culture   Final    NO GROWTH 2 DAYS Performed at [place] [address] [place], [place] [address]    Report Status PENDING  Incomplete  Culture, blood (Routine X 2) w Reflex to ID Panel     Status: None (Preliminary result)   Collection Time: 03/20/20 11:45 AM   Specimen: BLOOD  Result Value Ref Range Status   Specimen Description   Final    BLOOD RIGHT CHEST PORTA CATH Performed at [facility] [address], [place], [place] [address]    Special Requests   Final    BOTTLES DRAWN AEROBIC AND ANAEROBIC Blood Culture adequate volume Performed at [facility] [address], [place], [place] [address]    Culture   Final    NO GROWTH 2 DAYS Performed at [place] [address] [place], [place] [address]    Report Status PENDING  Incomplete  SARS Coronavirus 2 by RT PCR (hospital order, performed in [facility] hospital lab) Nasopharyngeal Nasopharyngeal Swab     Status: None   Collection Time: 03/20/20 11:45 AM   Specimen: Nasopharyngeal Swab  Result Value Ref Range Status   SARS Coronavirus 2 NEGATIVE NEGATIVE Final    Comment: (NOTE) SARS-CoV-2 target nucleic acids are NOT DETECTED. The SARS-CoV-2 RNA is generally detectable in upper and lower respiratory specimens during the acute phase of infection. The lowest concentration of SARS-CoV-2 viral copies this assay can detect is 250 copies / mL. A negative result does not preclude SARS-CoV-2 infection and should not be used as the sole basis for treatment or other patient management decisions.  A negative result may occur with improper specimen collection / handling, submission of specimen other than nasopharyngeal swab, presence of viral mutation(s) within the areas targeted by this assay, and inadequate number of viral copies (<250 copies / mL). A negative result must be combined with clinical observations,  patient history, and  epidemiological information. Fact Sheet for Patients:   StrictlyIdeas.no Fact Sheet for Healthcare Providers: BankingDealers.co.za This test is not yet approved or cleared  by the [place] FDA and has been authorized for detection and/or diagnosis of SARS-CoV-2 by FDA under an Emergency Use Authorization (EUA).  This EUA will remain in effect (meaning this test can be used) for the duration of the COVID-19 declaration under Section 564(b)(1) of the Act, 21 U.S.C. section 360bbb-3(b)(1), unless the authorization is terminated or revoked sooner. Performed at [facility], [place] [address], [place], [place] [address]   Culture, Urine     Status: Abnormal   Collection Time: 03/20/20 10:28 PM   Specimen: Urine, Random  Result Value Ref Range Status   Specimen Description   Final    URINE, RANDOM Performed at [facility] [address] [place], [place] [address]    Special Requests   Final    NONE Performed at [facility], [place] [address] [place], [place] [address]    Culture >=100,000 COLONIES/mL ESCHERICHIA COLI (A)  Final   Report Status 03/23/2020 FINAL  Final   Organism ID, Bacteria ESCHERICHIA COLI (A)  Final      Susceptibility   Escherichia coli - MIC*    AMPICILLIN <=2 SENSITIVE Sensitive     CEFAZOLIN <=4 SENSITIVE Sensitive     CEFTRIAXONE <=1 SENSITIVE Sensitive     CIPROFLOXACIN <=0.25 SENSITIVE Sensitive     GENTAMICIN <=1 SENSITIVE Sensitive     IMIPENEM <=0.25 SENSITIVE Sensitive     NITROFURANTOIN <=16 SENSITIVE Sensitive     TRIMETH/SULFA <=20 SENSITIVE Sensitive     AMPICILLIN/SULBACTAM <=2 SENSITIVE Sensitive     PIP/TAZO <=4 SENSITIVE Sensitive     * >=100,000 COLONIES/mL ESCHERICHIA COLI  MRSA PCR Screening     Status: Abnormal   Collection Time: 03/22/20  9:54 PM   Specimen: Nasal Mucosa; Nasopharyngeal  Result Value Ref Range Status   MRSA by  PCR POSITIVE (A) NEGATIVE Final    Comment:        The GeneXpert MRSA Assay (FDA approved for NASAL specimens only), is one component of a comprehensive MRSA colonization surveillance program. It is not intended to diagnose MRSA infection nor to guide or monitor treatment for MRSA infections. RESULT CALLED TO, READ BACK BY AND VERIFIED WITH:  Wynetta Emery, H AT 009 ON 03/23/2020 BY MOSLEY,J Performed at [place] [facility] [address], [place], [place] [address]      Studies: No results found.  Scheduled Meds: . alteplase  2 mg Intracatheter Once  . Chlorhexidine Gluconate Cloth  6 each Topical Daily  . dexamethasone (DECADRON) injection  2 mg Intravenous Q24H  . gabapentin  100 mg Oral BID  . mupirocin ointment  1 application Nasal BID  . pantoprazole (PROTONIX) IV  40 mg Intravenous QHS  . potassium chloride  40 mEq Oral Q2H  . sertraline  100 mg Oral Daily  . sodium chloride flush  10-40 mL Intracatheter Q12H    Continuous Infusions: . sodium chloride 75 mL/hr at 03/22/20 2205  . cefTRIAXone (ROCEPHIN)  IV Stopped (03/22/20 2247)  . levETIRAcetam Stopped (03/22/20 1938)  . magnesium sulfate bolus IVPB       LOS: 3 days   Time spent: 32 minutes  Natali Lavallee Marry Guan, MD [facility] Pager [phone]  If 7PM-7AM, please contact night-coverage [email or web address] Password [facility] 03/23/2020, 7:53 AM

## 2020-03-23 NOTE — Evaluation (Signed)
Physical Therapy Evaluation Patient Details Name: [name] MRN: [id number] DOB: [birth year] Today's Date: 03/23/2020   History of Present Illness  This is a 64 year old female with medical history significant for breast cancer with mets to brain, chronic leg edema with recurrent admissions for hypokalemia, GERD, DVT,chronic pain syndrome on methadone, oxycodone and gabapentin, chronic anemia/generalized weakness who was  sent to ED from  oncologist  on 5/14 due to altered mental status and finding of hemorrhagic right frontal metastasis  Clinical Impression  The patient is impulsive, requires frequent cuesfofr safety. Patient did require UE support to ambulate in room, used Rw but poorly able  To maneuvre around  Close spaces. No family present for DC planning. Patient in Hospital 3 weeks ago and was [place] home. Patient reports that she has an aid during the day. Will need clarification as patient will need 24/7 assistance. Pt admitted with above diagnosis.  Pt currently with functional limitations due to the deficits listed below (see PT Problem List). Pt will benefit from skilled PT to increase their independence and safety with mobility to allow discharge to the venue listed below.  '     Follow Up Recommendations Home health PT;Supervision/Assistance - 24 hour    Equipment Recommendations  Rolling walker with 5" wheels    Recommendations for Other Services       Precautions / Restrictions Precautions Precautions: Fall Precaution Comments: impulsive, urinary urgency, needs pads      Mobility  Bed Mobility   Bed Mobility: Supine to Sit     Supine to sit: Min guard     General bed mobility comments: close  guarding, extra time to push up to sitting upright.  Transfers Overall transfer level: Needs assistance Equipment used: None;Rolling walker (2 wheeled) Transfers: Sit to/from Stand Sit to Stand: Mod assist         General transfer comment: mod assist without  Rw,  cues for safety  toas pt. is impulsive. Stood afain at [employer] stating, I don't know how to use this.Multimodal cues for safety, steady assist to balance upon standing as patient was distracted by incontinence of urine.  Ambulation/Gait Ambulation/Gait assistance: Min assist Gait Distance (Feet): 15 Feet(x 2)   Gait Pattern/deviations: Step-through pattern;Decreased stride length;Shuffle;Staggering left;Staggering right Gait velocity: decr.   General Gait Details: patient hurridly,  ambulated to BR using Rw, multimodal cues  for safety  Stairs            Wheelchair Mobility    Modified Rankin (Stroke Patients Only)       Balance Overall balance assessment: History of Falls;Needs assistance Sitting-balance support: No upper extremity supported;Feet supported Sitting balance-Leahy Scale: Fair Sitting balance - Comments: cues for safety due to impulsive     Standing balance-Leahy Scale: Poor Standing balance comment: needs close guarding during standing, propped on sink to wash hands                             Pertinent Vitals/Pain Pain Assessment: No/denies pain    Home Living Family/patient expects to be discharged to:: Private residence Living Arrangements: Spouse/significant other;Children Available Help at Discharge: Family;Available PRN/intermittently Type of Home: House Home Access: Stairs to enter   Entrance Stairs-Number of Steps: 2 Home Layout: One level Home Equipment: Shower seat;Cane - single point;Walker - 2 wheels Additional Comments: poor historian, hose infor from previous encounter    Prior Function Level of Independence: Independent with assistive device(s)  Comments: reports using cane for mobility PRN, completing ADLs independently     Hand Dominance   Dominant Hand: Right    Extremity/Trunk Assessment   Upper Extremity Assessment Upper Extremity Assessment: Overall WFL for tasks assessed    Lower Extremity  Assessment Lower Extremity Assessment: Generalized weakness    Cervical / Trunk Assessment Cervical / Trunk Assessment: Normal  Communication   Communication: No difficulties  Cognition   Behavior During Therapy: Restless;Impulsive Overall Cognitive Status: No family/caregiver present to determine baseline cognitive functioning Area of Impairment: Orientation;Memory;Safety/judgement;Awareness;Problem solving                 Orientation Level: Disoriented to;Time;Situation(after several minutes, able to state [place] long without cues)   Memory: Decreased short-term memory   Safety/Judgement: Decreased awareness of safety Awareness: Emergent Problem Solving: Requires verbal cues General Comments: impulsive      General Comments      Exercises     Assessment/Plan    PT Assessment Patient needs continued PT services  PT Problem List Decreased strength;Decreased mobility;Decreased activity tolerance;Decreased balance;Decreased knowledge of use of DME;Pain;Decreased cognition;Decreased safety awareness       PT Treatment Interventions DME instruction;Therapeutic activities;Gait training;Therapeutic exercise;Patient/family education;Balance training;Functional mobility training    PT Goals (Current goals can be found in the Care Plan section)  Acute Rehab PT Goals Patient Stated Goal: to go home PT Goal Formulation: Patient unable to participate in goal setting Time For Goal Achievement: 04/06/20 Potential to Achieve Goals: Fair    Frequency Min 3X/week   Barriers to discharge Decreased caregiver support      Co-evaluation               AM-PAC PT "6 Clicks" Mobility  Outcome Measure Help needed turning from your back to your side while in a flat bed without using bedrails?: A Little Help needed moving from lying on your back to sitting on the side of a flat bed without using bedrails?: A Little Help needed moving to and from a bed to a chair (including a  wheelchair)?: A Lot Help needed standing up from a chair using your arms (e.g., wheelchair or bedside chair)?: A Lot Help needed to walk in hospital room?: A Lot Help needed climbing 3-5 steps with a railing? : A Lot 6 Click Score: 14    End of Session   Activity Tolerance: Patient tolerated treatment well Patient left: in chair;with call bell/phone within reach;with nursing/sitter in room Nurse Communication: Mobility status PT Visit Diagnosis: Muscle weakness (generalized) (M62.81);Difficulty in walking, not elsewhere classified (R26.2);Unsteadiness on feet (R26.81)    Time: 1040-1109 PT Time Calculation (min) (ACUTE ONLY): 29 min   Charges:   PT Evaluation $PT Eval Low Complexity: 1 Low PT Treatments $Gait Training: 8-22 mins        Tresa Endo PT Acute Rehabilitation Services Pager [phone] Office [phone]   Claretha Cooper 03/23/2020, 2:33 PM

## 2020-03-23 NOTE — Telephone Encounter (Signed)
No 5/14 los. No changes made to pt's schedule.  

## 2020-03-23 NOTE — Progress Notes (Signed)
Upper and lower extremity venous duplex has been completed.   Preliminary results in CV Proc.   Abram Sander 03/23/2020 8:51 AM

## 2020-03-23 NOTE — Progress Notes (Addendum)
HEMATOLOGY-ONCOLOGY PROGRESS NOTE  SUBJECTIVE: [name] was sent to the emergency room in the oncology office secondary to altered mental status.  She was noted to have hypokalemia with a potassium level of 2.2.  A CT of the head showed small volume intralesional hemorrhage within the treated right frontal metastasis, no significant mass-effect.  An MRI of the brain showed slightly increased size of the hemorrhagic right frontal metastasis with decreased enhancement and no significant edema, unchanged right temporal metastasis, no new intracranial metastases or acute infarct identified.  Unclear if AMS was related to her brain metastasis versus seizure versus pain medication.  She was empirically started on Keppra.  [name] is sitting up in the recliner chair today.  Her husband is at the bedside.  She also has a Actuary.  She reports that she is feeling well.  She does not recall being at the cancer center on Friday.  She has not seen any seizure-like activity.  She has no specific complaints.  Oncology History  Malignant neoplasm of upper-inner quadrant of right breast in female, estrogen receptor positive ([place])  10/03/2013 Initial Diagnosis   Malignant neoplasm of upper-inner quadrant of right breast in female, estrogen receptor positive ([place])   06/07/2016 -  Chemotherapy   The patient had trastuzumab (HERCEPTIN) 651 mg in sodium chloride 0.9 % 250 mL chemo infusion, 8 mg/kg = 651 mg, Intravenous,  Once, 35 of 35 cycles Dose modification: 440 mg (original dose 6 mg/kg, Cycle 7, Reason: Provider Judgment), 4 mg/kg (original dose 6 mg/kg, Cycle 10, Reason: Provider Judgment), 6 mg/kg (original dose 6 mg/kg, Cycle 17, Reason: Other (see comments), Comment: MD confirmed dose), 4 mg/kg (original dose 6 mg/kg, Cycle 21, Reason: Provider Judgment), 6 mg/kg (original dose 6 mg/kg, Cycle 26, Reason: Other (see comments), Comment: reentered for scanning issues), 6 mg/kg (original dose 6 mg/kg, Cycle 27, Reason:  Other (see comments), Comment: reentered to use current herceptin vial of 171m) Administration: 651 mg (06/07/2016), 483 mg (06/28/2016), 483 mg (01/03/2017), 483 mg (08/30/2016), 483 mg (10/11/2016), 440 mg (02/23/2017), 450 mg (03/16/2017), 450 mg (04/06/2017), 450 mg (04/27/2017), 300 mg (05/30/2017), 450 mg (07/04/2017), 450 mg (08/15/2017), 450 mg (09/05/2017), 450 mg (09/27/2017), 525 mg (11/29/2017), 525 mg (12/27/2017), 525 mg (01/24/2018), 525 mg (03/21/2018), 525 mg (04/18/2018), 336 mg (05/24/2018), 525 mg (06/13/2018), 525 mg (07/14/2018), 525 mg (08/14/2018), 525 mg (09/11/2018), 525 mg (10/23/2018), 525 mg (11/21/2018), 525 mg (12/19/2018), 525 mg (01/16/2019), 525 mg (02/13/2019), 525 mg (03/12/2019), 525 mg (04/09/2019), 525 mg (05/07/2019), 525 mg (06/04/2019), 525 mg (07/02/2019) pertuzumab (PERJETA) 840 mg in sodium chloride 0.9 % 250 mL chemo infusion, 840 mg, Intravenous, Once, 32 of 33 cycles Dose modification: 420 mg (original dose 420 mg, Cycle 8), 420 mg (original dose 420 mg, Cycle 14), 420 mg (original dose 420 mg, Cycle 18) Administration: 840 mg (06/07/2016), 420 mg (06/28/2016), 420 mg (04/06/2017), 420 mg (04/27/2017), 420 mg (07/04/2017), 420 mg (09/27/2017), 420 mg (11/09/2017), 420 mg (11/29/2017), 420 mg (12/27/2017), 420 mg (03/21/2018), 420 mg (04/18/2018), 420 mg (06/13/2018), 420 mg (07/14/2018), 420 mg (08/14/2018), 420 mg (09/11/2018), 420 mg (01/24/2018), 420 mg (10/23/2018), 420 mg (11/21/2018), 420 mg (12/19/2018), 420 mg (01/16/2019), 420 mg (02/13/2019), 420 mg (03/12/2019), 420 mg (04/09/2019), 420 mg (05/07/2019), 420 mg (06/04/2019), 420 mg (07/02/2019), 420 mg (07/30/2019), 420 mg (10/18/2019), 420 mg (12/18/2019), 420 mg (01/15/2020), 420 mg (02/21/2020) trastuzumab-anns (KANJINTI) 525 mg in sodium chloride 0.9 % 250 mL chemo infusion, 6 mg/kg = 525 mg (100 % of original dose 6  mg/kg), Intravenous,  Once, 5 of 6 cycles Dose modification: 6 mg/kg (original dose 6 mg/kg, Cycle 36, Reason: Other (see comments)), 450 mg (original  dose 6 mg/kg, Cycle 41, Reason: Provider Judgment, Comment: flat dose per MD) Administration: 525 mg (07/30/2019), 525 mg (10/18/2019), 525 mg (12/18/2019), 525 mg (01/15/2020), 600 mg (02/21/2020)  for chemotherapy treatment.    09/11/2018 -  Chemotherapy   The patient had PACLitaxel-protein bound (ABRAXANE) chemo infusion 200 mg, 100 mg/m2 = 200 mg (100 % of original dose 100 mg/m2), Intravenous,  Once, 9 of 18 cycles Dose modification: 100 mg/m2 (original dose 100 mg/m2, Cycle 7) Administration: 200 mg (04/09/2019), 200 mg (05/07/2019), 200 mg (06/04/2019), 200 mg (07/02/2019), 200 mg (07/30/2019), 200 mg (10/18/2019), 200 mg (12/18/2019), 200 mg (01/15/2020), 200 mg (02/21/2020) PACLitaxel-protein bound (ABRAXANE) chemo infusion 200 mg, 100 mg/m2 = 200 mg, Intravenous, Once, 6 of 6 cycles Administration: 200 mg (10/23/2018), 200 mg (11/21/2018), 200 mg (12/19/2018), 200 mg (01/16/2019), 200 mg (02/13/2019), 200 mg (03/12/2019)  for chemotherapy treatment.    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       REVIEW OF SYSTEMS:   Noncontributory except as noted in the HPI.  I have reviewed the past medical history, past surgical history, social history and family history with the patient and they are unchanged from previous note.   PHYSICAL  EXAMINATION: ECOG PERFORMANCE STATUS: 2 - Symptomatic, <50% confined to bed  Vitals:   03/23/20 0600 03/23/20 0800  BP: (!) 154/57 (!) 132/59  Pulse: (!) 50 (!) 55  Resp: 19 16  Temp:  98 F (36.7 C)  SpO2: 96% 98%   Filed Weights   03/22/20 2205 03/22/20 2238  Weight: 99.4 kg 99.4 kg    Intake/Output from previous day: 05/16 0701 - 05/17 0700 In: 8638 [P.O.:270; I.V.:1000; IV Piggyback:200] Out: -   GENERAL:alert, no distress and comfortable LUNGS: clear to auscultation and percussion with normal breathing effort HEART: regular rate & rhythm and no murmurs and no lower extremity edema ABDOMEN:abdomen soft, non-tender and normal bowel sounds NEURO: alert & oriented x 3 with fluent speech, no focal motor/sensory deficits  LABORATORY DATA:  I have reviewed the data as listed CMP Latest Ref Rng & Units 03/23/2020 03/22/2020 03/21/2020  Glucose 70 - 99 mg/dL 92 121(H) 127(H)  BUN 8 - 23 mg/dL '11 13 10  ' Creatinine 0.44 - 1.00 mg/dL 0.67 0.86 0.63  Sodium 135 - 145 mmol/L 145 143 139  Potassium 3.5 - 5.1 mmol/L 3.3(L) 2.8(L) 3.0(L)  Chloride 98 - 111 mmol/L 110 104 102  CO2 22 - 32 mmol/L '28 28 30  ' Calcium 8.9 - 10.3 mg/dL 8.8(L) 9.2 8.6(L)  Total Protein 6.5 - 8.1 g/dL - - 5.9(L)  Total Bilirubin 0.3 - 1.2 mg/dL - - 0.6  Alkaline Phos 38 - 126 U/L - - 70  AST 15 - 41 U/L - -  23  ALT 0 - 44 U/L - - 16    Lab Results  Component Value Date   WBC 7.8 03/23/2020   HGB 8.3 (L) 03/23/2020   HCT 28.1 (L) 03/23/2020   MCV 92.7 03/23/2020   PLT 296 03/23/2020   NEUTROABS 7.7 03/20/2020    CT Head Wo Contrast  Result Date: 03/20/2020 CLINICAL DATA:  Altered mental status, metastatic breast cancer EXAM: CT HEAD WITHOUT CONTRAST TECHNIQUE: Contiguous axial images were obtained from the base of the skull through the vertex without intravenous contrast. COMPARISON:  02/10/2020 FINDINGS: Brain: There is new hyperdensity associated with the right frontal metastasis reflecting small  volume intralesional hemorrhage. There is unchanged hypoattenuation in the right temporal lobe at the site of other known metastasis. There is no significant mass effect. Ventricles are stable in size and configuration. No new loss of gray-white differentiation. Vascular: Negative. Skull: Calvarium is unremarkable. Sinuses/Orbits: No acute finding. Other: None. IMPRESSION: Small volume intralesional hemorrhage within treated right frontal metastasis. No significant mass effect. These results were called by telephone at the time of interpretation on 03/20/2020 at 12:25 pm to provider Fredia Sorrow , who verbally acknowledged these results. Electronically Signed   By: Macy Mis [name]   On: 03/20/2020 12:35   MR BRAIN W WO CONTRAST  Result Date: 03/20/2020 CLINICAL DATA:  Encephalopathy. Metastatic breast cancer. EXAM: MRI HEAD WITHOUT AND WITH CONTRAST TECHNIQUE: Multiplanar, multiecho pulse sequences of the brain and surrounding structures were obtained without and with intravenous contrast. CONTRAST:  10 mL Gadavist COMPARISON:  Head CT 03/20/2020 and MRI 01/31/2020 FINDINGS: Multiple sequences are moderately to severely motion degraded. Brain: A heterogeneously enhancing right temporal lobe mass has not significantly changed in size from the prior MRI, measuring 4.9 x 2.6 x 2.1 cm (AP x transverse x craniocaudal) (previously 4.9 x 2.4 x 2.0 cm). There is persistent mild surrounding T2 hyperintensity/edema which is grossly similar to the prior MRI. Associated chronic blood products are noted. A hemorrhagic 2.7 x 2.5 cm right frontal mass has slightly increased in size although the amount of enhancement has decreased, and there is no significant edema. No new intracranial metastases are identified, however small lesions could easily be obscured by motion. No acute infarct, midline shift, or extra-axial fluid collection is identified. A mildly expanded partially empty sella is again noted. Mild cerebral  atrophy is unchanged. Background mild chronic small vessel ischemia in the cerebral white matter is similar to the prior MRI. Vascular: Major intracranial vascular flow voids are preserved. Skull and upper cervical spine: Chronically diminished bone marrow T1 signal intensity diffusely which may be related to the patient's known anemia. No grossly destructive skull lesion. Sinuses/Orbits: Unremarkable orbits. Clear paranasal sinuses. Increased, large left and moderate right mastoid effusions. Other: None. IMPRESSION: 1. Severely motion degraded examination. 2. Slightly increased size of hemorrhagic right frontal metastasis with decreased enhancement. No significant edema. 3. Unchanged right temporal metastasis. 4. No new intracranial metastases or acute infarct identified. 5. Increased size of left larger than right mastoid effusions. Electronically Signed   By: Logan Bores [name]   On: 03/20/2020 18:26   DG Chest Port 1 View  Result Date: 03/20/2020 CLINICAL DATA:  Altered mental status EXAM: PORTABLE CHEST 1 VIEW COMPARISON:  02/10/2020 FINDINGS: Left chest wall port catheter is unchanged. Low lung volumes. Mild interstitial prominence. No significant pleural effusion. No pneumothorax. Cardiomediastinal contours are likely within normal limits for technique. IMPRESSION: Low lung volumes. Nonspecific mild interstitial prominence, which could be chronic  or reflect mild superimposed edema. Electronically Signed   By: Macy Mis [name]   On: 03/20/2020 12:07   VAS [name] LOWER EXTREMITY VENOUS (DVT)  Result Date: 03/23/2020  Lower Venous DVTStudy Indications: Swelling, and Edema.  Comparison Study: 01/21/20 previous Performing Technologist: Abram Sander RVS  Examination Guidelines: A complete evaluation includes B-mode imaging, spectral Doppler, color Doppler, and power Doppler as needed of all accessible portions of each vessel. Bilateral testing is considered an integral part of a complete examination. Limited  examinations for reoccurring indications may be performed as noted. The reflux portion of the exam is performed with the patient in reverse Trendelenburg.  +---------+---------------+---------+-----------+----------+--------------+ RIGHT    CompressibilityPhasicitySpontaneityPropertiesThrombus Aging +---------+---------------+---------+-----------+----------+--------------+ CFV      Full           Yes      Yes                                 +---------+---------------+---------+-----------+----------+--------------+ SFJ      Full                                                        +---------+---------------+---------+-----------+----------+--------------+ FV Prox  Full                                                        +---------+---------------+---------+-----------+----------+--------------+ FV Mid   Full                                                        +---------+---------------+---------+-----------+----------+--------------+ FV Distal               Yes      Yes                                 +---------+---------------+---------+-----------+----------+--------------+ PFV      Full                                                        +---------+---------------+---------+-----------+----------+--------------+ POP      Full           Yes      Yes                                 +---------+---------------+---------+-----------+----------+--------------+ PTV      Full                                                        +---------+---------------+---------+-----------+----------+--------------+ PERO  Full                                                        +---------+---------------+---------+-----------+----------+--------------+   +----+---------------+---------+-----------+----------+--------------+ LEFTCompressibilityPhasicitySpontaneityPropertiesThrombus Aging  +----+---------------+---------+-----------+----------+--------------+ CFV Full           Yes      Yes                                 +----+---------------+---------+-----------+----------+--------------+     Summary: RIGHT: - No evidence of deep vein thrombosis in the lower extremity. No indirect evidence of obstruction proximal to the inguinal ligament. - No cystic structure found in the popliteal fossa.  LEFT: - No evidence of common femoral vein obstruction.  *See table(s) above for measurements and observations.    Preliminary    VAS [name] UPPER EXTREMITY VENOUS DUPLEX  Result Date: 03/23/2020 UPPER VENOUS STUDY  Indications: Swelling, and Edema Comparison Study: 02/09/15 previous Performing Technologist: Abram Sander RVS  Examination Guidelines: A complete evaluation includes B-mode imaging, spectral Doppler, color Doppler, and power Doppler as needed of all accessible portions of each vessel. Bilateral testing is considered an integral part of a complete examination. Limited examinations for reoccurring indications may be performed as noted.  Right Findings: +----------+------------+---------+-----------+----------+-------+ RIGHT     CompressiblePhasicitySpontaneousPropertiesSummary +----------+------------+---------+-----------+----------+-------+ IJV           Full       Yes       Yes                      +----------+------------+---------+-----------+----------+-------+ Subclavian    Full       Yes       Yes                      +----------+------------+---------+-----------+----------+-------+ Axillary      Full       Yes       Yes                      +----------+------------+---------+-----------+----------+-------+ Brachial      Full       Yes       Yes                      +----------+------------+---------+-----------+----------+-------+ Radial        Full                                           +----------+------------+---------+-----------+----------+-------+ Ulnar         Full                                          +----------+------------+---------+-----------+----------+-------+ Cephalic      Full                                          +----------+------------+---------+-----------+----------+-------+ Basilic       Full                                          +----------+------------+---------+-----------+----------+-------+  Summary:  Right: No evidence of deep vein thrombosis in the upper extremity. No evidence of superficial vein thrombosis in the upper extremity.  *See table(s) above for measurements and observations.    Preliminary     ASSESSMENT:  64 y.o. [place] woman with stage IV breast cancer manifested chiefly by loco-regional nodal disease (neck, chest) and skin involvement, without liver, bone, or brain metastases documented    (1) Status post right upper inner quadrant lumpectomy and sentinel lymph node sampling 03/04/2002 for 2 separate foci of invasive ductal carcinoma, mpT2 pN1 or stage IIB, both foci grade 3, both estrogen and progesterone receptor positive, both HercepTest 3+, Mib-1 56%   (2) Reexcision for margins 05/27/2002 showed no residual cancer in the breast.   (3) The patient refused adjuvant systemic therapy.   (4) Adjuvant radiation treatment completed 08/14/2002.   (5) recurrence in the right breast in 02/2004 showing a morphologically different tumor, again grade 3, again estrogen and progesterone receptor negative, with an MIB-1 of 14% and Herceptest 3+.   (5) Between 03/2004 and 07/2004 she received dose dense Doxorubicin/Cyclophosphamide x 4 given with trastuzumab, followed by weekly Docetaxel x 8, again given with trastuzumab.   (6) Right mastectomy 07/13/2004 showed scattered microscopic foci of residual disease over an area  greater than 5 cm. Margins were negative.   (7) Postoperative Docetaxel continued until  09/2004.   (8) Trastuzumab (Herceptin) given 08/2004 through 01/2012 with some brief interruptions.   RECURRENT/ STAGE IV DISEASE December 2006 (9) Isolated right cervical nodal recurrence 10/2005, treated with radiation to the right supraclavicular area (total 41.5 gray) completed 12/29/2005.   (10) Navelbine given together with Herceptin  11/2005 through 03/2006.   (9) Left mastectomy 02/13/2006 for ductal carcinoma in situ, grade 2, estrogen and progesterone receptor negative, with negative margins; 0 of 3 lymph nodes involved   (10) treated with Lapatinib and Capecitabine before 10/2009, for an unclear duration and with unclear results (cannot locate data on chart review).   (11) Status post right supraclavicular lymph node biopsy 09/2010 again positive for an invasive ductal carcinoma, estrogen and progesterone receptor negative, HER-2 positive by C[facility] with a ratio 4.25.   (12) Navelbine given together with Herceptin between 05/2011 and 11/2011.   (13) Carboplatin/ Gemcitabine/ Herceptin given for 2 cycles, in 12/2011 and 01/2012.   (14) TDM-1 ([place]) started 02/2012. Last dose 10/02/2013 after which the patient discontinued treatment at her own discretion. Echo on December 2014 showed a well preserved ejection fraction.   (15) Deep vein thrombosis of the right upper extremity documented 04/20/2011.  She completed anticoagulant therapy with Coumadin on 03/25/2013.   (16) Chronic right upper extremity lymphedema, not responsive to aggressive PT             (a) biopsy of denuded area 04/23/2015 read as dermatofibroma (discordant)             (b) deeper cuts of 04/23/2015 biopsy suggest angiosarcoma   (17) Right chest port-a-cath removal due to infection on 01/28/2013. Left chest Port-A-Cath placed on 04/08/2013; being flushed every 6 weeks   RIGHT UPPER EXTREMITY ANGIOSARCOMA VS BREAST CANCER: August 2016 (18) treated at cancer centers of [place] August 2016 with paclitaxel,  trastuzumab and pertuzumab 1 cycle, afterwards referred to hospice   (19) under the care of hospice of [facility] August 2016 to 05/08/2016, when the patient opted for a second try at chemotherapy   (20) started low-dose Abraxane, trastuzumab and pertuzumab 06/07/2016, to be repeated every 4 weeks.              (  a) pretreatment echocardiogram 06/06/2016 showed a 60-65% ejection fraction             (b) significant compliance problems compromise response to treatment             (c)  echocardiogram 02/20/17 LVEF 55-60%             (d) Abraxane discontinued 07/04/2017 because of possible neuropathy symptoms             (e) Abraxane resumed 09/27/2017             (f) echocardiogram 07/20/2017 showed an ejection fraction of 60-65%             (g) echo 01/22/2018 showed an ejection fraction of 50-55%             (h) CT scan of the chest 05/03/2018 shows no disease involving the lungs liver or lymph nodes, with stable subcutaneous masses as previously noted             (I) Abraxane discontinued August 2019 because of concerns regarding neuropathy-- gemcitabine substituted             (j) echocardiogram 08/20/2018 shows an ejection fraction in the 55-60% range (done every 6 months)             (k) gemcitabine discontinued because of multiple symptoms, Abraxane restarted 09/11/2018, given once every 4 weeks             (l) echocardiogram 07/17/2019 shows a well-preserved ejection fraction at 55-60%             (m) restaging PET scan 08/20/2019 shows hypermetabolic adenopathy, subcutaneous malignancy as clinically appreciated, but no liver or lung parenchymal lesions   (21) Chronic pain secondary to known metastatic disease             (a) patient signed pain contract 10/19/2016             (b) as of 11/16/2016 receives her pain medications from Dr [name]             (c)  Dr [name] no longer able to prescribe as of February 2019             (d) all pain medications now through [facility], Dr Mirna Mires (as of April 2019)   (22) left breast and left axillary adenopathy noted clinically             (a) left axillary lymph node biopsy on 07/24/2019 benign   (23) BRAIN METASTASES: frontal and temporal lobe metastases noted on brain MRI 09/04/2019             (a) adjuvant radiation 09/11/2019-10/16/2019:                          The brain was treated to 30 Gy in 10 fractions.              (b) tucatinib prescribed December 2020 at 150 mg daily because of methadone interaction, finally started March 2021 but taken irregularly by the patient.             (c) noncontrast head CT 01/12/2020 stable lesions/improved edema             (d) brain MRI 02/01/2020 shows decreased size of the right frontal and temporal lobe metastases with mild residual edema, no mass-effect, and no evidence of new mets             (  e) noncontrast MRI of the cervical, thoracic, and lumbar spine 02/11/2020 and 02/12/2020 showed no metastatic disease   (24) COVID-19 infection 2019-11-19     PLAN: Patsey seems to be feeling better today.  She is much more alert and awake.  She does not recall being at the cancer center on Friday.  Unclear if AMS was related to overmedication versus possible seizure versus brain mets.  She has been started on Keppra.  In any event, she is much better today.   Lizvet is now 14-1/2 years out from definitive diagnosis of metastatic breast cancer.  Her peripheral disease is generally well controlled on Abraxane plus anti-HER-2 treatment with trastuzumab and Pertuzumab.  She has received radiation for her central nervous system disease and is supposed to be on a very low dose of tucatinib although it is not clear whether she is receiving this.    Note that all her pain medicines are under the care of Dr. Mirna Mires.  We will arrange for outpatient follow-up for Breea at the cancer center after discharge.   LOS: 3 days   [name], DNP, AGPCNP-BC,  AOCNP 03/23/20   ADDENDUM: Ruthmary is back to baseline mentally--knows me, knows it's Monday, knows where she is. She doesn't remember how she got to the cancer center last week. She tells me she does not have alprazolam available and that her daughter now gives her all her medicines, so she does not think this was medication related. She tells me she had seizures as a child. Now of course she has a small CNS bleed and mets. She is tolerating keppra with no side efefcts that she is aware of   I am making her a return appt at the cancer center for May 26; will likely resume treatment for her peripheral disease at that time.  I personally saw this patient and performed a substantive portion of this encounter with the listed APP documented above.   Chauncey Cruel, MD Medical Oncology and Hematology [place] [facility] [place] [facility] [address] [place], [place] [address] Tel. [phone]    Fax. [phone]

## 2020-03-23 NOTE — Procedures (Signed)
Patient Name: [name]  MRN: [id number]  Epilepsy Attending: Lora Havens  Referring Physician/Provider: Dr Antonieta Pert Date: 03/23/2020 Duration: 23.51 mins  Patient history: 64yo F with right frontal and temporal brain mets and ams. EEG to evaluate for seizure.   Level of alertness: Awake, asleep  AEDs during EEG study: Gabapentin, LEV  Technical aspects: This EEG study was done with scalp electrodes positioned according to the 10-20 International system of electrode placement. Electrical activity was acquired at a sampling rate of 500Hz  and reviewed with a high frequency filter of 70Hz  and a low frequency filter of 1Hz . EEG data were recorded continuously and digitally stored.   Description: The posterior dominant rhythm consists of 8Hz  activity of moderate voltage (25-35 uV) seen predominantly in posterior head regions, symmetric and reactive to eye opening and eye closing.  Sleep was characterized by vertex waves, sleep spindles (12 to 14 Hz), maximal frontocentral region.  Hyperventilation and photic stimulation were not performed.     IMPRESSION: This study is within normal limits. No seizures or epileptiform discharges were seen throughout the recording.   [name] [name]

## 2020-03-24 ENCOUNTER — Telehealth: Payer: Self-pay | Admitting: Oncology

## 2020-03-24 DIAGNOSIS — C50911 Malignant neoplasm of unspecified site of right female breast: Secondary | ICD-10-CM

## 2020-03-24 LAB — CBC
HCT: 29.5 % — ABNORMAL LOW (ref 36.0–46.0)
Hemoglobin: 8.8 g/dL — ABNORMAL LOW (ref 12.0–15.0)
MCH: 27.8 pg (ref 26.0–34.0)
MCHC: 29.8 g/dL — ABNORMAL LOW (ref 30.0–36.0)
MCV: 93.1 fL (ref 80.0–100.0)
Platelets: 285 10*3/uL (ref 150–400)
RBC: 3.17 MIL/uL — ABNORMAL LOW (ref 3.87–5.11)
RDW: 16.6 % — ABNORMAL HIGH (ref 11.5–15.5)
WBC: 7.6 10*3/uL (ref 4.0–10.5)
nRBC: 0 % (ref 0.0–0.2)

## 2020-03-24 LAB — BASIC METABOLIC PANEL
Anion gap: 11 (ref 5–15)
BUN: 12 mg/dL (ref 8–23)
CO2: 26 mmol/L (ref 22–32)
Calcium: 8.4 mg/dL — ABNORMAL LOW (ref 8.9–10.3)
Chloride: 106 mmol/L (ref 98–111)
Creatinine, Ser: 0.75 mg/dL (ref 0.44–1.00)
GFR calc Af Amer: >60 mL/min (ref >60)
GFR calc non Af Amer: >60 mL/min (ref >60)
Glucose, Bld: 91 mg/dL (ref 70–99)
Potassium: 3.8 mmol/L (ref 3.5–5.1)
Sodium: 143 mmol/L (ref 135–145)

## 2020-03-24 MED ORDER — CEPHALEXIN 500 MG PO CAPS: 500.0000 mg | ORAL_CAPSULE | Freq: Four times a day (QID) | ORAL | 0 refills | Status: AC

## 2020-03-24 MED ORDER — ONDANSETRON HCL 4 MG PO TABS: 4.0000 mg | ORAL_TABLET | Freq: Four times a day (QID) | ORAL | 0 refills | Status: DC | PRN

## 2020-03-24 MED ORDER — ACETAMINOPHEN 325 MG PO TABS: 650.0000 mg | ORAL_TABLET | Freq: Four times a day (QID) | ORAL | Status: AC | PRN

## 2020-03-24 MED ORDER — DEXAMETHASONE 2 MG PO TABS
ORAL_TABLET | ORAL | 0 refills | Status: DC
Start: 2020-03-24 — End: 2020-05-27

## 2020-03-24 MED ORDER — LEVETIRACETAM 500 MG PO TABS
500.0000 mg | ORAL_TABLET | Freq: Two times a day (BID) | ORAL | 2 refills | Status: DC
Start: 2020-03-24 — End: 2020-07-22

## 2020-03-24 MED ORDER — HEPARIN SOD (PORK) LOCK FLUSH 100 UNIT/ML IV SOLN
500.0000 [IU] | INTRAVENOUS | Status: AC | PRN
  Administered 2020-03-24: 500 [IU]
  Filled 2020-03-24: qty 5

## 2020-03-24 MED ORDER — MUPIROCIN 2 % EX OINT: 1.0000 "application " | TOPICAL_OINTMENT | Freq: Two times a day (BID) | CUTANEOUS | 0 refills | Status: AC

## 2020-03-24 NOTE — Evaluation (Signed)
Occupational Therapy Evaluation Patient Details Name: [name] MRN: [id number] DOB: Jul 12, 1956 Today's Date: 03/24/2020    History of Present Illness This is a 64 year old female with medical history significant for breast cancer with mets to brain, chronic leg edema with recurrent admissions for hypokalemia, GERD, DVT,chronic pain syndrome on methadone, oxycodone and gabapentin, chronic anemia/generalized weakness who was  sent to ED from  oncologist  on 5/14 due to altered mental status and finding of hemorrhagic right frontal metastasis   Clinical Impression   Upon arrival to patient's room, chair alarm going off and patient stating "I need to pee!" Before OT can turn off chair alarm and set up commode patient is up and walking towards bathroom, knocking over soda with heart monitor lines. OT provide max cues throughout session to redirect patient and ensure safety however patient is very impulsive and states "I am safe." Patient is primarily supervision level for ADLs due to poor cognition/safety awareness. Recommend 24/7 supervision at home for patient safety, pt states her DTRs are "always there."     Follow Up Recommendations  Supervision/Assistance - 24 hour    Equipment Recommendations  None recommended by OT       Precautions / Restrictions Precautions Precautions: Fall Precaution Comments: impulsive, urinary urgency, needs pads Restrictions Weight Bearing Restrictions: No      Mobility Bed Mobility               General bed mobility comments: seated in chair  Transfers Overall transfer level: Needs assistance Equipment used: None Transfers: Sit to/from Stand Sit to Stand: Supervision         General transfer comment: for safety    Balance Overall balance assessment: History of Falls;Needs assistance Sitting-balance support: No upper extremity supported;Feet supported Sitting balance-Leahy Scale: Good     Standing balance support: No upper extremity  supported;During functional activity Standing balance-Leahy Scale: Fair Standing balance comment: no loss of balance however highly impulsive close supervision for safety                           ADL either performed or assessed with clinical judgement   ADL Overall ADL's : Needs assistance/impaired     Grooming: Supervision/safety;Standing;Wash/dry hands Grooming Details (indicate cue type and reason): wash hands at sink Upper Body Bathing: Set up;Sitting   Lower Body Bathing: Supervison/ safety;Sit to/from stand   Upper Body Dressing : Set up;Sitting   Lower Body Dressing: Supervision/safety;Sitting/lateral leans;Sit to/from stand Lower Body Dressing Details (indicate cue type and reason): pt able to doff shoes and clean feet after spilling soda on the floor Toilet Transfer: Supervision/safety;Ambulation;BSC Toilet Transfer Details (indicate cue type and reason): cues for safety as patient turns quickly and is forgetful of lines knocking soda off bedside table with heart monitor lines Toileting- Clothing Manipulation and Hygiene: Supervision/safety;Sit to/from stand;Sitting/lateral lean       Functional mobility during ADLs: Supervision/safety General ADL Comments: patient overall supervision level for safety as patient highly impulsive, standing or turning quickly and forgetful of monitor lines                  Pertinent Vitals/Pain Pain Assessment: No/denies pain     Hand Dominance Right   Extremity/Trunk Assessment Upper Extremity Assessment Upper Extremity Assessment: Overall WFL for tasks assessed   Lower Extremity Assessment Lower Extremity Assessment: Defer to PT evaluation       Communication Communication Communication: No difficulties   Cognition Arousal/Alertness: Awake/alert Behavior  During Therapy: Restless;Impulsive Overall Cognitive Status: No family/caregiver present to determine baseline cognitive functioning Area of Impairment:  Following commands;Safety/judgement;Problem solving                       Following Commands: Follows one step commands inconsistently Safety/Judgement: Decreased awareness of safety;Decreased awareness of deficits   Problem Solving: Requires verbal cues General Comments: very impulsive! try to re-direct however patient highly distractable and does not feel she is unsafe              Home Living Family/patient expects to be discharged to:: Private residence Living Arrangements: Spouse/significant other;Children Available Help at Discharge: Family Type of Home: House Home Access: Stairs to enter Technical brewer of Steps: 2   Home Layout: One level     Bathroom Shower/Tub: Teacher, early years/pre: Standard     Home Equipment: Shower seat;Cane - single point;Walker - 2 wheels   Additional Comments: info obtained from previous admission, pt reports DTRs are there "all the time"      Prior Functioning/Environment Level of Independence: Independent with assistive device(s)        Comments: reports using cane for mobility PRN, completing ADLs independently        OT Problem List: Decreased cognition;Decreased safety awareness      OT Treatment/Interventions: Self-care/ADL training;DME and/or AE instruction;Energy conservation;Therapeutic activities;Cognitive remediation/compensation;Balance training;Patient/family education;Therapeutic exercise    OT Goals(Current goals can be found in the care plan section) Acute Rehab OT Goals Patient Stated Goal: to go home OT Goal Formulation: With patient Time For Goal Achievement: 04/07/20 Potential to Achieve Goals: Fair  OT Frequency: Min 2X/week    AM-PAC OT "6 Clicks" Daily Activity     Outcome Measure Help from another person eating meals?: None Help from another person taking care of personal grooming?: A Little Help from another person toileting, which includes using toliet, bedpan, or  urinal?: A Little Help from another person bathing (including washing, rinsing, drying)?: A Little Help from another person to put on and taking off regular upper body clothing?: A Little Help from another person to put on and taking off regular lower body clothing?: A Little 6 Click Score: 19   End of Session  Activity Tolerance: Patient tolerated treatment well Patient left: in chair;with call bell/phone within reach;with chair alarm set  OT Visit Diagnosis: Other abnormalities of gait and mobility (R26.89);History of falling (Z91.81)                Time: [id number] OT Time Calculation (min): 20 min Charges:  OT General Charges $OT Visit: 1 Visit OT Evaluation $OT Eval Moderate Complexity: [place] OT Pager: [place] 03/24/2020, 11:46 AM

## 2020-03-24 NOTE — Discharge Summary (Signed)
Physician Discharge Summary  [name] ZJI:[id number] DOB: 05/25/1956 DOA: 03/20/2020  PCP: Libby Maw, MD  Admit date: 03/20/2020 Discharge date: 03/24/2020  Admitted From: Home Disposition: Home Recommendations for Outpatient Follow-up:  1. Follow up with PCP in 1-2 weeks 2. Please obtain BMP/CBC in one week 3. Please follow up with Dr. Jana Hakim on Apr 01, 2020  Home Health yes physical therapy Equipment/Devices: None  Discharge Condition: Stable and improved CODE STATUS DNR Diet recommendation: Cardiac diet Brief/Interim Summary:64 year old female with medical history significant for breast cancer with mets to brain, chronic leg edema with recurrent admissions for hypokalemia, GERD, chronic pain syndrome on methadone, oxycodone and gabapentin, chronic anemia/generalized weakness who presented to her oncologist office for chemotherapy 5/14 but was brought to the emergency department for admission and later admitted due to altered mental status and finding of hemorrhagic right frontal metastasis. On the morning of 5/15, patient alert and oriented with no complaints, though she was unable to recall how she ended up in ER. We discussed finding of hemorrhage in her frontal metastasis and she was started on Keppra and Dexamethasone.     Discharge Diagnoses:  Active Problems:   Essential hypertension   Chronic pain   Port catheter in place   Acute encephalopathy   Generalized weakness   Hypokalemia   Cancer of right breast metastatic to brain ([place])     Acute encephalopathy:possibleseizure in the setting of brain mets and E. coli UTI. .CT scan shows intralesional hemorrhage per neurosurgery,with whom the ED be discussed,less likely the etiology of her AMS.  Could also be metabolic from her UTI.  Admitting MD discussed with Dr. [name] patient's neuro oncologist- andwill empirically put her on Keppra 527m twice daily, dexamethasone keep on seizure precautions,    EEG-no epileptiform activity is noted. MRI brain with and without contrast shows no significant edema and no new mets.  Keppra was continued at 500 mg twice a day.  She will be discharged on the same dose.  She will also be discharged on Decadron 2 mg daily for 7 days. On the day of discharge she was awake alert anxious to go home and she had already called her family member to pick her up since she was told the previous day that she will be going home on 03/24/2020.  Severe hypokalemiaresolved continue supplementation as she was taking at home.  Hypomagnesemia resolved  UTI urine culture growing more than 100,000 colonies of E. coli.  We will discharge her on Keflex for 4 days.  Chronic leg edema continue torsemide and HCTZ as you were taking at home.  Essential hypertension: Continue home meds as above.  Chronic pain:Follow-up with pain management as an outpatient.  Estimated body mass index is 37.61 kg/m as calculated from the following:   Height as of this encounter: '5\' 4"'  (1.626 m).   Weight as of this encounter: 99.4 kg.  Discharge Instructions  Discharge Instructions    Diet - low sodium heart healthy   Complete by: As directed    Increase activity slowly   Complete by: As directed      Allergies as of 03/24/2020      Reactions   Tramadol Nausea Only   Hydrocodone-acetaminophen Itching, Rash   Pork-derived Products Other (See Comments)   Pt says she just doesn't eat pork; has no reaction to pork products      Medication List    STOP taking these medications   Klor-Con M20 20 MEQ tablet Generic drug: potassium chloride  SA   methadone 10 MG tablet Commonly known as: DOLOPHINE   naloxone 4 MG/0.1ML Liqd nasal spray kit Commonly known as: NARCAN   Oxycodone HCl 20 MG Tabs   polyethylene glycol 17 g packet Commonly known as: MIRALAX / GLYCOLAX     TAKE these medications   acetaminophen 325 MG tablet Commonly known as: TYLENOL Take 2 tablets (650 mg total)  by mouth every 6 (six) hours as needed for mild pain (or Fever >/= 101).   allopurinol 300 MG tablet Commonly known as: ZYLOPRIM Take 300 mg by mouth daily.   ALPRAZolam 1 MG tablet Commonly known as: XANAX Take 1 tablet (1 mg total) by mouth at bedtime as needed for sleep.   cephALEXin 500 MG capsule Commonly known as: KEFLEX Take 1 capsule (500 mg total) by mouth 4 (four) times daily for 4 days.   cholecalciferol 25 MCG (1000 UNIT) tablet Commonly known as: VITAMIN D3 Take 1,000 Units by mouth daily. What changed: Another medication with the same name was removed. Continue taking this medication, and follow the directions you see here.   dexamethasone 2 MG tablet Commonly known as: DECADRON Take Decadron 2 mg daily for 7 days and then stop   gabapentin 100 MG capsule Commonly known as: NEURONTIN Take 1 capsule (300 mg total) by mouth 2 (two) times daily as needed. What changed: See the new instructions.   iron polysaccharides 150 MG capsule Commonly known as: NIFEREX Take 1 capsule (150 mg total) by mouth daily.   levETIRAcetam 500 MG tablet Commonly known as: Keppra Take 1 tablet (500 mg total) by mouth 2 (two) times daily.   MAGNESIUM PO Take 1 tablet by mouth daily.   multivitamin capsule Take 1 capsule by mouth daily.   mupirocin ointment 2 % Commonly known as: BACTROBAN Place 1 application into the nose 2 (two) times daily.   omeprazole 40 MG capsule Commonly known as: PRILOSEC Take 1 capsule (40 mg total) by mouth daily. What changed:   when to take this  reasons to take this   ondansetron 4 MG tablet Commonly known as: ZOFRAN Take 1 tablet (4 mg total) by mouth 3 (three) times daily as needed. What changed: Another medication with the same name was added. Make sure you understand how and when to take each.   ondansetron 4 MG tablet Commonly known as: ZOFRAN Take 1 tablet (4 mg total) by mouth every 6 (six) hours as needed for nausea. What  changed: You were already taking a medication with the same name, and this prescription was added. Make sure you understand how and when to take each.   Potassium Chloride ER 20 MEQ Tbcr Take 1 tablet by mouth daily.   prochlorperazine 10 MG tablet Commonly known as: COMPAZINE Take 1 tablet (10 mg total) by mouth every 6 (six) hours as needed for nausea or vomiting.   sertraline 100 MG tablet Commonly known as: ZOLOFT Take 1 tablet (100 mg total) by mouth daily.   torsemide 20 MG tablet Commonly known as: DEMADEX Take 10 mg by mouth daily.   triamterene-hydrochlorothiazide 37.5-25 MG capsule Commonly known as: DYAZIDE Take 1 capsule by mouth daily.   tucatinib 150 MG tablet Commonly known as: TUKYSA Take 150 mg by mouth every morning.   VITAMIN C PO Take 1 tablet by mouth daily.      Follow-up Information    Libby Maw, MD Follow up.   Specialty: Family Medicine Contact information: [place]  [place] [address] [phone]        [name], [name], MD Follow up.   Specialty: Oncology Contact information: [address] [place] [address] [phone]          Allergies  Allergen Reactions  . Tramadol Nausea Only  . Hydrocodone-Acetaminophen Itching and Rash  . Pork-Derived Products Other (See Comments)    Pt says she just doesn't eat pork; has no reaction to pork products    Consultations: Oncology Dr. Jana Hakim Procedures/Studies: CT Head Wo Contrast  Result Date: 03/20/2020 CLINICAL DATA:  Altered mental status, metastatic breast cancer EXAM: CT HEAD WITHOUT CONTRAST TECHNIQUE: Contiguous axial images were obtained from the base of the skull through the vertex without intravenous contrast. COMPARISON:  02/10/2020 FINDINGS: Brain: There is new hyperdensity associated with the right frontal metastasis reflecting small volume intralesional hemorrhage. There is unchanged hypoattenuation in the right temporal lobe  at the site of other known metastasis. There is no significant mass effect. Ventricles are stable in size and configuration. No new loss of gray-white differentiation. Vascular: Negative. Skull: Calvarium is unremarkable. Sinuses/Orbits: No acute finding. Other: None. IMPRESSION: Small volume intralesional hemorrhage within treated right frontal metastasis. No significant mass effect. These results were called by telephone at the time of interpretation on 03/20/2020 at 12:25 pm to provider Fredia Sorrow , who verbally acknowledged these results. Electronically Signed   By: Macy Mis [name]   On: 03/20/2020 12:35   MR BRAIN W WO CONTRAST  Result Date: 03/20/2020 CLINICAL DATA:  Encephalopathy. Metastatic breast cancer. EXAM: MRI HEAD WITHOUT AND WITH CONTRAST TECHNIQUE: Multiplanar, multiecho pulse sequences of the brain and surrounding structures were obtained without and with intravenous contrast. CONTRAST:  10 mL Gadavist COMPARISON:  Head CT 03/20/2020 and MRI 01/31/2020 FINDINGS: Multiple sequences are moderately to severely motion degraded. Brain: A heterogeneously enhancing right temporal lobe mass has not significantly changed in size from the prior MRI, measuring 4.9 x 2.6 x 2.1 cm (AP x transverse x craniocaudal) (previously 4.9 x 2.4 x 2.0 cm). There is persistent mild surrounding T2 hyperintensity/edema which is grossly similar to the prior MRI. Associated chronic blood products are noted. A hemorrhagic 2.7 x 2.5 cm right frontal mass has slightly increased in size although the amount of enhancement has decreased, and there is no significant edema. No new intracranial metastases are identified, however small lesions could easily be obscured by motion. No acute infarct, midline shift, or extra-axial fluid collection is identified. A mildly expanded partially empty sella is again noted. Mild cerebral atrophy is unchanged. Background mild chronic small vessel ischemia in the cerebral white matter is  similar to the prior MRI. Vascular: Major intracranial vascular flow voids are preserved. Skull and upper cervical spine: Chronically diminished bone marrow T1 signal intensity diffusely which may be related to the patient's known anemia. No grossly destructive skull lesion. Sinuses/Orbits: Unremarkable orbits. Clear paranasal sinuses. Increased, large left and moderate right mastoid effusions. Other: None. IMPRESSION: 1. Severely motion degraded examination. 2. Slightly increased size of hemorrhagic right frontal metastasis with decreased enhancement. No significant edema. 3. Unchanged right temporal metastasis. 4. No new intracranial metastases or acute infarct identified. 5. Increased size of left larger than right mastoid effusions. Electronically Signed   By: Logan Bores [name]   On: 03/20/2020 18:26   DG Chest Port 1 View  Result Date: 03/20/2020 CLINICAL DATA:  Altered mental status EXAM: PORTABLE CHEST 1 VIEW COMPARISON:  02/10/2020 FINDINGS: Left chest wall port catheter is unchanged. Low lung  volumes. Mild interstitial prominence. No significant pleural effusion. No pneumothorax. Cardiomediastinal contours are likely within normal limits for technique. IMPRESSION: Low lung volumes. Nonspecific mild interstitial prominence, which could be chronic or reflect mild superimposed edema. Electronically Signed   By: Macy Mis [name]   On: 03/20/2020 12:07   EEG adult  Result Date: 03/23/2020 Lora Havens, MD     03/23/2020  3:50 PM Patient Name: [name] MRN: [id number] Epilepsy Attending: Lora Havens Referring Physician/Provider: Dr Antonieta Pert Date: 03/23/2020 Duration: 23.51 mins Patient history: 64yo F with right frontal and temporal brain mets and ams. EEG to evaluate for seizure. Level of alertness: Awake, asleep AEDs during EEG study: Gabapentin, LEV Technical aspects: This EEG study was done with scalp electrodes positioned according to the 10-20 International system of electrode  placement. Electrical activity was acquired at a sampling rate of '500Hz'  and reviewed with a high frequency filter of '70Hz'  and a low frequency filter of '1Hz' . EEG data were recorded continuously and digitally stored. Description: The posterior dominant rhythm consists of '8Hz'  activity of moderate voltage (25-35 uV) seen predominantly in posterior head regions, symmetric and reactive to eye opening and eye closing.  Sleep was characterized by vertex waves, sleep spindles (12 to 14 Hz), maximal frontocentral region.  Hyperventilation and photic stimulation were not performed.   IMPRESSION: This study is within normal limits. No seizures or epileptiform discharges were seen throughout the recording. Priyanka O Yadav   VAS [name] LOWER EXTREMITY VENOUS (DVT)  Result Date: 03/23/2020  Lower Venous DVTStudy Indications: Swelling, and Edema.  Comparison Study: 01/21/20 previous Performing Technologist: Abram Sander RVS  Examination Guidelines: A complete evaluation includes B-mode imaging, spectral Doppler, color Doppler, and power Doppler as needed of all accessible portions of each vessel. Bilateral testing is considered an integral part of a complete examination. Limited examinations for reoccurring indications may be performed as noted. The reflux portion of the exam is performed with the patient in reverse Trendelenburg.  +---------+---------------+---------+-----------+----------+--------------+ RIGHT    CompressibilityPhasicitySpontaneityPropertiesThrombus Aging +---------+---------------+---------+-----------+----------+--------------+ CFV      Full           Yes      Yes                                 +---------+---------------+---------+-----------+----------+--------------+ SFJ      Full                                                        +---------+---------------+---------+-----------+----------+--------------+ FV Prox  Full                                                         +---------+---------------+---------+-----------+----------+--------------+ FV Mid   Full                                                        +---------+---------------+---------+-----------+----------+--------------+ FV Distal  Yes      Yes                                 +---------+---------------+---------+-----------+----------+--------------+ PFV      Full                                                        +---------+---------------+---------+-----------+----------+--------------+ POP      Full           Yes      Yes                                 +---------+---------------+---------+-----------+----------+--------------+ PTV      Full                                                        +---------+---------------+---------+-----------+----------+--------------+ PERO     Full                                                        +---------+---------------+---------+-----------+----------+--------------+   +----+---------------+---------+-----------+----------+--------------+ LEFTCompressibilityPhasicitySpontaneityPropertiesThrombus Aging +----+---------------+---------+-----------+----------+--------------+ CFV Full           Yes      Yes                                 +----+---------------+---------+-----------+----------+--------------+     Summary: RIGHT: - No evidence of deep vein thrombosis in the lower extremity. No indirect evidence of obstruction proximal to the inguinal ligament. - No cystic structure found in the popliteal fossa.  LEFT: - No evidence of common femoral vein obstruction.  *See table(s) above for measurements and observations. Electronically signed by Monica Martinez MD on 03/23/2020 at 5:34:38 PM.    Final    VAS [name] UPPER EXTREMITY VENOUS DUPLEX  Result Date: 03/23/2020 UPPER VENOUS STUDY  Indications: Swelling, and Edema Comparison Study: 02/09/15 previous Performing Technologist: Abram Sander RVS   Examination Guidelines: A complete evaluation includes B-mode imaging, spectral Doppler, color Doppler, and power Doppler as needed of all accessible portions of each vessel. Bilateral testing is considered an integral part of a complete examination. Limited examinations for reoccurring indications may be performed as noted.  Right Findings: +----------+------------+---------+-----------+----------+-------+ RIGHT     CompressiblePhasicitySpontaneousPropertiesSummary +----------+------------+---------+-----------+----------+-------+ IJV           Full       Yes       Yes                      +----------+------------+---------+-----------+----------+-------+ Subclavian    Full       Yes       Yes                      +----------+------------+---------+-----------+----------+-------+ Axillary      Full  Yes       Yes                      +----------+------------+---------+-----------+----------+-------+ Brachial      Full       Yes       Yes                      +----------+------------+---------+-----------+----------+-------+ Radial        Full                                          +----------+------------+---------+-----------+----------+-------+ Ulnar         Full                                          +----------+------------+---------+-----------+----------+-------+ Cephalic      Full                                          +----------+------------+---------+-----------+----------+-------+ Basilic       Full                                          +----------+------------+---------+-----------+----------+-------+  Summary:  Right: No evidence of deep vein thrombosis in the upper extremity. No evidence of superficial vein thrombosis in the upper extremity.  *See table(s) above for measurements and observations.  Diagnosing physician: Monica Martinez MD Electronically signed by Monica Martinez MD on 03/23/2020 at 5:33:17 PM.    Final      (Echo, Carotid, EGD, Colonoscopy, ERCP)    Subjective:  Patient is awake alert resting in bed answer all my questions appropriately.  She called her family to pick her up this morning.  She was told that she will be going home today.  Denies any headaches or confusion.  She is awake and alert no nausea vomiting or diarrhea. Discharge Exam: Vitals:   03/24/20 0600 03/24/20 0800  BP: 134/74 (!) 145/65  Pulse: (!) 50 (!) 48  Resp: 18 15  Temp:  98.1 F (36.7 C)  SpO2: 96% 100%   Vitals:   03/24/20 0311 03/24/20 0406 03/24/20 0600 03/24/20 0800  BP: (!) 161/97 (!) 123/58 134/74 (!) 145/65  Pulse: (!) 50 (!) 49 (!) 50 (!) 48  Resp: '14 20 18 15  ' Temp:    98.1 F (36.7 C)  TempSrc:    Oral  SpO2: 97% 95% 96% 100%  Weight:      Height:        General: Pt is alert, awake, not in acute distress Cardiovascular: RRR, S1/S2 +, no rubs, no gallops Respiratory: CTA bilaterally, no wheezing, no rhonchi Abdominal: Soft, NT, ND, bowel sounds + Extremities: 1+ edema lower extremities   The results of significant diagnostics from this hospitalization (including imaging, microbiology, ancillary and laboratory) are listed below for reference.     Microbiology: Recent Results (from the past 240 hour(s))  Culture, blood (Routine X 2) w Reflex to ID Panel     Status: None (Preliminary result)   Collection Time: 03/20/20 11:45 AM  Specimen: BLOOD  Result Value Ref Range Status   Specimen Description   Final    BLOOD LEFT CHEST PORTA CATH Performed at [facility] [address] [place], [place] [address]    Special Requests   Final    BOTTLES DRAWN AEROBIC AND ANAEROBIC Blood Culture adequate volume Performed at [facility] [address] [place], [place] [address]    Culture   Final    NO GROWTH 4 DAYS Performed at [facility], [place] [address] [place], [place] [address]    Report Status PENDING  Incomplete  Culture, blood  (Routine X 2) w Reflex to ID Panel     Status: None (Preliminary result)   Collection Time: 03/20/20 11:45 AM   Specimen: BLOOD  Result Value Ref Range Status   Specimen Description   Final    BLOOD RIGHT CHEST PORTA CATH Performed at [facility] [address], [place], [place] [address]    Special Requests   Final    BOTTLES DRAWN AEROBIC AND ANAEROBIC Blood Culture adequate volume Performed at [facility] [address] [place], [place] [address]    Culture   Final    NO GROWTH 4 DAYS Performed at [facility], [place] [address] [place], [place] [address]    Report Status PENDING  Incomplete  SARS Coronavirus 2 by RT PCR (hospital order, performed in [facility] hospital lab) Nasopharyngeal Nasopharyngeal Swab     Status: None   Collection Time: 03/20/20 11:45 AM   Specimen: Nasopharyngeal Swab  Result Value Ref Range Status   SARS Coronavirus 2 NEGATIVE NEGATIVE Final    Comment: (NOTE) SARS-CoV-2 target nucleic acids are NOT DETECTED. The SARS-CoV-2 RNA is generally detectable in upper and lower respiratory specimens during the acute phase of infection. The lowest concentration of SARS-CoV-2 viral copies this assay can detect is 250 copies / mL. A negative result does not preclude SARS-CoV-2 infection and should not be used as the sole basis for treatment or other patient management decisions.  A negative result may occur with improper specimen collection / handling, submission of specimen other than nasopharyngeal swab, presence of viral mutation(s) within the areas targeted by this assay, and inadequate number of viral copies (<250 copies / mL). A negative result must be combined with clinical observations, patient history, and epidemiological information. Fact Sheet for Patients:   StrictlyIdeas.no Fact Sheet for Healthcare Providers: BankingDealers.co.za This test is not yet  approved or cleared  by the [place] FDA and has been authorized for detection and/or diagnosis of SARS-CoV-2 by FDA under an Emergency Use Authorization (EUA).  This EUA will remain in effect (meaning this test can be used) for the duration of the COVID-19 declaration under Section 564(b)(1) of the Act, 21 U.S.C. section 360bbb-3(b)(1), unless the authorization is terminated or revoked sooner. Performed at [facility], [place] [address] [place], [place] [address]   Culture, Urine     Status: Abnormal   Collection Time: 03/20/20 10:28 PM   Specimen: Urine, Random  Result Value Ref Range Status   Specimen Description   Final    URINE, RANDOM Performed at [facility] [address], [place], [place] [address]    Special Requests   Final    NONE Performed at [facility], [place] [address], [place], [place] [address]    Culture >=100,000 COLONIES/mL ESCHERICHIA COLI (A)  Final   Report  Status 03/23/2020 FINAL  Final   Organism ID, Bacteria ESCHERICHIA COLI (A)  Final      Susceptibility   Escherichia coli - MIC*    AMPICILLIN <=2 SENSITIVE Sensitive     CEFAZOLIN <=4 SENSITIVE Sensitive     CEFTRIAXONE <=1 SENSITIVE Sensitive     CIPROFLOXACIN <=0.25 SENSITIVE Sensitive     GENTAMICIN <=1 SENSITIVE Sensitive     IMIPENEM <=0.25 SENSITIVE Sensitive     NITROFURANTOIN <=16 SENSITIVE Sensitive     TRIMETH/SULFA <=20 SENSITIVE Sensitive     AMPICILLIN/SULBACTAM <=2 SENSITIVE Sensitive     PIP/TAZO <=4 SENSITIVE Sensitive     * >=100,000 COLONIES/mL ESCHERICHIA COLI  MRSA PCR Screening     Status: Abnormal   Collection Time: 03/22/20  9:54 PM   Specimen: Nasal Mucosa; Nasopharyngeal  Result Value Ref Range Status   MRSA by PCR POSITIVE (A) NEGATIVE Final    Comment:        The GeneXpert MRSA Assay (FDA approved for NASAL specimens only), is one component of a comprehensive MRSA colonization surveillance program. It is  not intended to diagnose MRSA infection nor to guide or monitor treatment for MRSA infections. RESULT CALLED TO, READ BACK BY AND VERIFIED WITH:  Wynetta Emery, H AT 009 ON 03/23/2020 BY MOSLEY,J Performed at [facility] [address] [place], [place] [address]      Labs: BNP (last 3 results) Recent Labs    01/21/20 0907 03/20/20 1145  BNP 27.8 40.1   Basic Metabolic Panel: Recent Labs  Lab 03/20/20 1208 03/20/20 1651 03/21/20 0000 03/21/20 0651 03/22/20 0821 03/23/20 0530 03/24/20 0548  NA   < >  --  139 139 143 145 143  K   < >  --  2.9* 3.0* 2.8* 3.3* 3.8  CL   < >  --  101 102 104 110 106  CO2   < >  --  '29 30 28 28 26  ' GLUCOSE   < >  --  133* 127* 121* 92 91  BUN   < >  --  '11 10 13 11 12  ' CREATININE   < >  --  0.70 0.63 0.86 0.67 0.75  CALCIUM   < >  --  8.5* 8.6* 9.2 8.8* 8.4*  MG  --  1.2*  --   --   --  1.2*  --    < > = values in this interval not displayed.   Liver Function Tests: Recent Labs  Lab 03/20/20 0919 03/20/20 1145 03/21/20 0651  AST '31 29 23  ' ALT '18 20 16  ' ALKPHOS 93 77 70  BILITOT 0.3 0.5 0.6  PROT 6.7 6.5 5.9*  ALBUMIN 2.9* 3.0* 2.6*   Recent Labs  Lab 03/20/20 1145  LIPASE 37   No results for input(s): AMMONIA in the last 168 hours. CBC: Recent Labs  Lab 03/20/20 0919 03/20/20 0919 03/20/20 1145 03/20/20 1145 03/20/20 1208 03/21/20 0651 03/22/20 0821 03/23/20 0530 03/24/20 0548  WBC 10.2  --  9.9  --   --  6.8 9.0 7.8 7.6  NEUTROABS 7.9*  --  7.7  --   --   --   --   --   --   HGB 9.4*  --  9.1*   < > 9.9* 8.8* 9.0* 8.3* 8.8*  HCT 30.1*   < > 30.3*   < > 29.0* 29.8* 29.5* 28.1* 29.5*  MCV 89.6   < > 90.7  --   --  91.7 91.6 92.7 93.1  PLT 277  --  283  --   --  276 320 296 285   < > = values in this interval not displayed.   Cardiac Enzymes: No results for input(s): CKTOTAL, CKMB, CKMBINDEX, TROPONINI in the last 168 hours. BNP: Invalid input(s): POCBNP CBG: Recent Labs  Lab 03/20/20 1050   GLUCAP 84   D-Dimer No results for input(s): DDIMER in the last 72 hours. Hgb A1c No results for input(s): HGBA1C in the last 72 hours. Lipid Profile No results for input(s): CHOL, HDL, LDLCALC, TRIG, CHOLHDL, LDLDIRECT in the last 72 hours. Thyroid function studies No results for input(s): TSH, T4TOTAL, T3FREE, THYROIDAB in the last 72 hours.  Invalid input(s): FREET3 Anemia work up No results for input(s): VITAMINB12, FOLATE, FERRITIN, TIBC, IRON, RETICCTPCT in the last 72 hours. Urinalysis    Component Value Date/Time   COLORURINE YELLOW 03/20/2020 1110   APPEARANCEUR HAZY (A) 03/20/2020 1110   LABSPEC 1.009 03/20/2020 1110   PHURINE 6.0 03/20/2020 1110   GLUCOSEU NEGATIVE 03/20/2020 1110   HGBUR NEGATIVE 03/20/2020 1110   BILIRUBINUR NEGATIVE 03/20/2020 1110   KETONESUR NEGATIVE 03/20/2020 1110   PROTEINUR NEGATIVE 03/20/2020 1110   UROBILINOGEN 0.2 12/29/2010 1427   NITRITE POSITIVE (A) 03/20/2020 1110   LEUKOCYTESUR LARGE (A) 03/20/2020 1110   Sepsis Labs Invalid input(s): PROCALCITONIN,  WBC,  LACTICIDVEN Microbiology Recent Results (from the past 240 hour(s))  Culture, blood (Routine X 2) w Reflex to ID Panel     Status: None (Preliminary result)   Collection Time: 03/20/20 11:45 AM   Specimen: BLOOD  Result Value Ref Range Status   Specimen Description   Final    BLOOD LEFT CHEST PORTA CATH Performed at [facility], [place] [address] [place], [place] [address]    Special Requests   Final    BOTTLES DRAWN AEROBIC AND ANAEROBIC Blood Culture adequate volume Performed at [facility] [address] [place], [place] [address]    Culture   Final    NO GROWTH 4 DAYS Performed at [facility], [place] [address], [place], [place] [address]    Report Status PENDING  Incomplete  Culture, blood (Routine X 2) w Reflex to ID Panel     Status: None (Preliminary result)   Collection Time: 03/20/20 11:45 AM   Specimen: BLOOD   Result Value Ref Range Status   Specimen Description   Final    BLOOD RIGHT CHEST PORTA CATH Performed at [facility] [address] [place], [place] [address]    Special Requests   Final    BOTTLES DRAWN AEROBIC AND ANAEROBIC Blood Culture adequate volume Performed at [facility] [address], [place], [place] [address]    Culture   Final    NO GROWTH 4 DAYS Performed at [facility], [place] [address] [place],  [address]    Report Status PENDING  Incomplete  SARS Coronavirus 2 by RT PCR (hospital order, performed in [facility] hospital lab) Nasopharyngeal Nasopharyngeal Swab     Status: None   Collection Time: 03/20/20 11:45 AM   Specimen: Nasopharyngeal Swab  Result Value Ref Range Status   SARS Coronavirus 2 NEGATIVE NEGATIVE Final    Comment: (NOTE) SARS-CoV-2 target nucleic acids are NOT DETECTED. The SARS-CoV-2 RNA is generally detectable in upper and lower respiratory specimens during the acute phase of infection. The lowest concentration of SARS-CoV-2 viral copies this assay can  detect is 250 copies / mL. A negative result does not preclude SARS-CoV-2 infection and should not be used as the sole basis for treatment or other patient management decisions.  A negative result may occur with improper specimen collection / handling, submission of specimen other than nasopharyngeal swab, presence of viral mutation(s) within the areas targeted by this assay, and inadequate number of viral copies (<250 copies / mL). A negative result must be combined with clinical observations, patient history, and epidemiological information. Fact Sheet for Patients:   StrictlyIdeas.no Fact Sheet for Healthcare Providers: BankingDealers.co.za This test is not yet approved or cleared  by the [place] FDA and has been authorized for detection and/or diagnosis of SARS-CoV-2 by FDA under an  Emergency Use Authorization (EUA).  This EUA will remain in effect (meaning this test can be used) for the duration of the COVID-19 declaration under Section 564(b)(1) of the Act, 21 U.S.C. section 360bbb-3(b)(1), unless the authorization is terminated or revoked sooner. Performed at [facility], [place] [address], [place], [place] [address]   Culture, Urine     Status: Abnormal   Collection Time: 03/20/20 10:28 PM   Specimen: Urine, Random  Result Value Ref Range Status   Specimen Description   Final    URINE, RANDOM Performed at [facility] [address] [place], [place] [address]    Special Requests   Final    NONE Performed at [facility], [place] [address], [place], [place] [address]    Culture >=100,000 COLONIES/mL ESCHERICHIA COLI (A)  Final   Report Status 03/23/2020 FINAL  Final   Organism ID, Bacteria ESCHERICHIA COLI (A)  Final      Susceptibility   Escherichia coli - MIC*    AMPICILLIN <=2 SENSITIVE Sensitive     CEFAZOLIN <=4 SENSITIVE Sensitive     CEFTRIAXONE <=1 SENSITIVE Sensitive     CIPROFLOXACIN <=0.25 SENSITIVE Sensitive     GENTAMICIN <=1 SENSITIVE Sensitive     IMIPENEM <=0.25 SENSITIVE Sensitive     NITROFURANTOIN <=16 SENSITIVE Sensitive     TRIMETH/SULFA <=20 SENSITIVE Sensitive     AMPICILLIN/SULBACTAM <=2 SENSITIVE Sensitive     PIP/TAZO <=4 SENSITIVE Sensitive     * >=100,000 COLONIES/mL ESCHERICHIA COLI  MRSA PCR Screening     Status: Abnormal   Collection Time: 03/22/20  9:54 PM   Specimen: Nasal Mucosa; Nasopharyngeal  Result Value Ref Range Status   MRSA by PCR POSITIVE (A) NEGATIVE Final    Comment:        The GeneXpert MRSA Assay (FDA approved for NASAL specimens only), is one component of a comprehensive MRSA colonization surveillance program. It is not intended to diagnose MRSA infection nor to guide or monitor treatment for MRSA infections. RESULT CALLED TO, READ BACK BY  AND VERIFIED WITH:  Wynetta Emery, H AT 009 ON 03/23/2020 BY MOSLEY,J Performed at [facility] [address] [place], [place] [address]      Time coordinating discharge:  39 minutes  SIGNED:   Georgette Shell, MD  [facility] 03/24/2020, 9:36 AM Pager   If 7PM-7AM, please contact night-coverage [email or web address] Password [email or web address]

## 2020-03-24 NOTE — Telephone Encounter (Signed)
Scheduled appt per 5/17 sch message - daughter is aware of appt date and time

## 2020-03-24 NOTE — TOC Transition Note (Signed)
Transition of Care [facility]) - CM/SW Discharge Note   Patient Details  Name: [name] MRN: [id number] Date of Birth: Nov 18, 1955  [facility] CM/SW Contact:  Leeroy Cha, RN Phone Number: 03/24/2020, 11:35 AM   Clinical Narrative:    Text to Adela Lank with [facility] resume pt at home and given dc date of todaty.   Final next level of care: [place] Barriers to Discharge: Continued Medical Work up   Patient Goals and CMS Choice Patient states their goals for this hospitalization and ongoing recovery are:: to go back home CMS Medicare.gov Compare Post Acute Care list provided to:: Patient    Discharge Placement                       Discharge Plan and Services                          HH Arranged: PT [facility] Agency: [place] Date [place]: 03/24/20 Time [place]: [id number] Representative spoke with at [place]: cory  Social Determinants of Health ([place]) Interventions     Readmission Risk Interventions Readmission Risk Prevention Plan 03/02/2020  Transportation Screening Complete  Medication Review Press photographer) Complete  PCP or Specialist appointment within 3-5 days of discharge Complete  HRI or [place] Complete  SW Recovery Care/Counseling Consult Complete  [place] Not Applicable  Some recent data might be hidden

## 2020-03-25 ENCOUNTER — Telehealth: Payer: Self-pay | Admitting: *Deleted

## 2020-03-25 LAB — CULTURE, BLOOD (ROUTINE X 2)
Culture: NO GROWTH
Culture: NO GROWTH
Special Requests: ADEQUATE
Special Requests: ADEQUATE

## 2020-03-25 MED FILL — TUKYSA 150 MG TABS: 150 | 30 days supply | Qty: 60 | Fill #3

## 2020-03-25 NOTE — Telephone Encounter (Signed)
1st attempt. Unable to reach patient.   

## 2020-03-25 NOTE — Telephone Encounter (Signed)
This RN received an email per [facility] account from [place] requesting medical records.  Email request forwarded to HIM director per office protoccol.  Note email received today from Gabrielle Dare asking if email received and when she should be expecting records- of note request for records received yesterday with court date for continuing medicaid benefits for a personal home assistant is for tomorrow 03/26/2020.

## 2020-03-26 ENCOUNTER — Other Ambulatory Visit: Payer: Medicare Other | Admitting: Hospice

## 2020-03-26 ENCOUNTER — Other Ambulatory Visit: Payer: Self-pay

## 2020-03-26 DIAGNOSIS — Z170 Estrogen receptor positive status [ER+]: Secondary | ICD-10-CM

## 2020-03-26 DIAGNOSIS — Z515 Encounter for palliative care: Secondary | ICD-10-CM

## 2020-03-26 DIAGNOSIS — C50211 Malignant neoplasm of upper-inner quadrant of right female breast: Secondary | ICD-10-CM

## 2020-03-26 NOTE — Telephone Encounter (Signed)
I have made two attempts and have been unable to reach patient. I have mailed the patient a letter requesting they call the office to schedule a hospital follow up appointment.   

## 2020-03-26 NOTE — Progress Notes (Signed)
[facility] Consult Note Telephone: [phone]  Fax: [phone]  PATIENT NAME: [name] DOB: 24-Jul-1956 MRN: [id number]  PRIMARY CARE PROVIDER:   Libby Maw, MD  REFERRING PROVIDER:[name], Sarajane Jews, MD RESPONSIBLE PARTY:Self Contacts: [phone] POA [name] [phone]   RECOMMENDATIONS/PLAN:  Advance Care Planning/Goals of Care Visit to build trust and follow up on palliative care.   Patient is a DO NOT RESUSCITATE.  MOST form given for deliberation within the family and for further discussion next visit. Family encouraged to keep the DNR form in a visible place in the house and to take it along if patient needs to go to the hospital.  Goals of care include to maximize quality of life and symptom management.  Visit consisted of counseling and education dealing with the complex and emotionally intense issues of symptom management and palliative care in the setting of serious and potentially life-threatening illness. Palliative care team will continue to support patient, patient's family, and medical team. Symptom management patient is alert and participating in today's visit, unlike in last visit where she was woozy.  Patient denies coughing, shortness of breath, no complaint of pain/discomfort.  Chart review shows patient was hospitalized 03/20/2020 to 03/24/2020 for altered mental status; hemorrhage in her frontal metastases was found and she was started on Keppra and dexamethasone.  Her daughter [name] continues to handle her medications and this has been helpful for patient.  Patient also was ordered Keflex for UTI.  Education provided to patient and to [name] to take the medications as ordered and to completion.  Patient was also advised to stop taking methadone naloxone and oxycodone during her last hospital visit.  Daughter in agreement that patient is more alert since pain medications were  stopped.  [name] said patient will not be going to the pain clinic anymore. Patient with ongoing confusion and memory loss likely related to brain mets.   Next chemo is scheduled next week Wednesday. Patient is not consistent in taking [name].  Education reiterated on the need to be consistent in going for chemo and taking [name] if that is her chosen course of treatment.   MRI planned for 1st April 16, 2020.  Patient denied nausea or vomiting and said that she feels a lot better today.   She continues on wearing her briefs for incontinence.  No edema to bilateral lower extremities.  Education and demonstration provided for elevation of bilateral lower extremities.  Patient in no medical acuity, denies pain and discomfort at this time.  Encouraged ongoing care and to call with concerns. Follow [id number] care will continue to follow patient for goals of care clarification and symptom management. I spent1 hour and 16 minutes providing this consultation;time includes time with patient/family, chart review and documentation. More than 50% of the time in this consultation was spent on coordinating communication  HISTORY OF PRESENT ILLNESS:[name] a 64 y.o.year oldfemalewith multiple medical problems includingStage IV estrogen receptor negative Breast Cancer, with brain mets, angiosarcoma, Gout.Palliative Care was asked to help address goals of care.    CODE STATUS: DNR  PPS: weak 50% HOSPICE ELIGIBILITY/DIAGNOSIS: TBD  PAST MEDICAL HISTORY:  Past Medical History:  Diagnosis Date   Breast cancer ([place])    DVT (deep venous thrombosis) ([place]) 10/07/2011   Fever 02/06/2018   Hypertension    Radiation 11/29/2005-12/29/2005   right supraclavicular area 4147 cGy   Radiation 06/26/02-08/14/02   right breast 5040 cGy, tumor bed boosted to 1260  cGy   Rheumatic fever     SOCIAL HX:  Social History   Tobacco Use   Smoking status: Former Smoker   Smokeless tobacco: Never  Used  Substance Use Topics   Alcohol use: Never    Comment: Rarely    ALLERGIES:  Allergies  Allergen Reactions   Tramadol Nausea Only   Hydrocodone-Acetaminophen Itching and Rash   Pork-Derived Products Other (See Comments)    Pt says she just doesn't eat pork; has no reaction to pork products     PERTINENT MEDICATIONS:  Outpatient Encounter Medications as of 03/26/2020  Medication Sig   acetaminophen (TYLENOL) 325 MG tablet Take 2 tablets (650 mg total) by mouth every 6 (six) hours as needed for mild pain (or Fever >/= 101).   allopurinol (ZYLOPRIM) 300 MG tablet Take 300 mg by mouth daily.   ALPRAZolam (XANAX) 1 MG tablet Take 1 tablet (1 mg total) by mouth at bedtime as needed for sleep.   Ascorbic Acid (VITAMIN C PO) Take 1 tablet by mouth daily.   cephALEXin (KEFLEX) 500 MG capsule Take 1 capsule (500 mg total) by mouth 4 (four) times daily for 4 days.   cholecalciferol (VITAMIN D3) 25 MCG (1000 UNIT) tablet Take 1,000 Units by mouth daily.   dexamethasone (DECADRON) 2 MG tablet Take Decadron 2 mg daily for 7 days and then stop   gabapentin (NEURONTIN) 100 MG capsule Take 1 capsule (300 mg total) by mouth 2 (two) times daily as needed. (Patient taking differently: Take 100 mg by mouth 2 (two) times daily. )   iron polysaccharides (NIFEREX) 150 MG capsule Take 1 capsule (150 mg total) by mouth daily.   levETIRAcetam (KEPPRA) 500 MG tablet Take 1 tablet (500 mg total) by mouth 2 (two) times daily.   MAGNESIUM PO Take 1 tablet by mouth daily.   Multiple Vitamin (MULTIVITAMIN) capsule Take 1 capsule by mouth daily.     mupirocin ointment (BACTROBAN) 2 % Place 1 application into the nose 2 (two) times daily.   omeprazole (PRILOSEC) 40 MG capsule Take 1 capsule (40 mg total) by mouth daily. (Patient taking differently: Take 40 mg by mouth daily as needed (heartburn). )   ondansetron (ZOFRAN) 4 MG tablet Take 1 tablet (4 mg total) by mouth 3 (three) times daily as  needed.   ondansetron (ZOFRAN) 4 MG tablet Take 1 tablet (4 mg total) by mouth every 6 (six) hours as needed for nausea.   Potassium Chloride ER 20 MEQ TBCR Take 1 tablet by mouth daily.   prochlorperazine (COMPAZINE) 10 MG tablet Take 1 tablet (10 mg total) by mouth every 6 (six) hours as needed for nausea or vomiting.   sertraline (ZOLOFT) 100 MG tablet Take 1 tablet (100 mg total) by mouth daily.   torsemide (DEMADEX) 20 MG tablet Take 10 mg by mouth daily.   triamterene-hydrochlorothiazide (DYAZIDE) 37.5-25 MG capsule Take 1 capsule by mouth daily.   tucatinib (TUKYSA) 150 MG tablet Take 150 mg by mouth every morning.    Facility-Administered Encounter Medications as of 03/26/2020  Medication   sodium chloride 0.9 % injection 10 mL   sodium chloride flush (NS) 0.9 % injection 10 mL    PHYSICAL EXAM/ROS:  General: NAD, cooperative Cardiovascular: regular rate and rhythm; denies chest pain Pulmonary: clear ant//post fields; normal respiratory effort Abdomen: soft, nontender, + bowel sounds GU: no suprapubic tenderness Extremities: No edema to bilateral lower extremities Skin: no rashes to exposed skin Neurological: Weakness but otherwise nonfocal; some confusion  noted  Teodoro Spray, NP

## 2020-03-26 NOTE — Progress Notes (Signed)
Pharmacist Chemotherapy Monitoring - Follow Up Assessment    I verify that I have reviewed each item in the below checklist:  . Regimen for the patient is scheduled for the appropriate day and plan matches scheduled date. Marland Kitchen Appropriate non-routine labs are ordered dependent on drug ordered. . If applicable, additional medications reviewed and ordered per protocol based on lifetime cumulative doses and/or treatment regimen.   Plan for follow-up and/or issues identified: Yes . I-vent associated with next due treatment: Yes . MD and/or nursing notified: No  Britt Boozer 03/26/2020 10:32 AM

## 2020-03-30 ENCOUNTER — Encounter: Payer: Self-pay | Admitting: Licensed Clinical Social Worker

## 2020-03-30 NOTE — Progress Notes (Signed)
[facility] Work  Received forwarded voicemail from patient stating she needs help because they want to take away her personal home aide services. Stated she needs forms filled out to keep services.   CSW attempted to call back patient, but unable to reach her. Mobile number- no answer, no VM available. CSW left message on home number with direct contact information.   Edwinna Areola Blessing [place], LCSW

## 2020-03-31 ENCOUNTER — Encounter: Payer: Self-pay | Admitting: Licensed Clinical Social Worker

## 2020-03-31 NOTE — Progress Notes (Addendum)
[place]  Telephone:([phone]) [phone] [phone]) [phone]    ID: [name]   DOB: 12/20/1955  MR#: [id number]  NOB#:[id number]  Patient Care Team: Libby Maw, MD as PCP - General (Family Medicine) [name], [name], MD as Consulting Physician (Oncology) [name], MD as Consulting Physician (Radiation Oncology) [name], [name], MD as Consulting Physician (Cardiology) [name], [name], MD as Consulting Physician (Psychiatry) [name], MD as Consulting Physician (Anesthesiology) [name], MD as Consulting Physician (Internal Medicine) [name], [name] 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  No family came in with her.  She receives Abraxane/trastuzumab/Pertuzumab monthly.  Her most recent treatment was 02/21/2020.  When she came into our office on 5/14 she was unarousable in clinic.  She was given two 0.26m doses of narcan, and her somnolence remained, so she was taken to the ER and was found to have a right frontal lobe hemorrhagic brain metastases.  She received Dexamethasone and Keppra.   She notes her thinking has remained slow, and that she gets confused sometimes.  She says that she lost the ability to walk initially, but has since regained that.  She is scheduled for follow up brain MRI on 04/16/2020.  Her most recent echocardiogram on 12/25/2019 showed an ejection fraction of 60-65%.  REVIEW OF SYSTEMS: MMishelledenies any new pain.  She says that she is no longer taking any pain medications because [name]. MJana Hakimdoes not want her to take them any more.  She says that she is taking the tucatinib and  has since she was discharged.  She has noted that she has about 1-2 loose bowel movements per day.    MSophinadenies any new peripheral neuropathy in her fingertips/toes.  She is without fever, chills, chest pain, palpitations, cough, bladder changes, or nausea/vomiting.   BREAST CANCER HISTORY: From the original intake note:  The patient originally presented in May 2003 when she noticed a lump in the upper inner quadrant of her right breast in September 2003.  She sought attention and had a mammogram which showed an obvious carcinoma in the upper inner quadrant of the right breast, approximately 2 cm.  There were some enlarged lymph nodes in the axilla and an FNA done showed those consistent with malignant cells, most likely an invasive ductal carcinoma.  At that point she was unsure of what to do and was referred by [name]. PMarylene Buergerfor a discussion of her treatment options.  By biopsy, it was ER/PR negative and HER-2 was 3+.  DNA index was 1.42.  We reiterated that it was most important to have her disease surgically addressed at that point and had recommended lumpectomy and axillary nodes to be addressed with which she followed through and on 04-03-02, [name]. YAnnamaria Bootsperformed a right partial mastectomy and a right axillary lymph node dissection.  Final pathology revealed a 2.4 cm. high grade, Grade III invasive ductal carcinoma with an adjacent .8 cm. also high grade invasive ductal carcinoma which was felt to represent an intramammary metastasis rather than a second primary.  A smaller mass was just medial to the larger mass.  The smaller mass was associated with high grade DCIS component.  There was no definite lymphovascular  invasion identified.  However, one of fourteen axillary lymph nodes did contain a 1.5 cm. metastatic deposit.  Postoperatively she did very well.  We reiterated the fact that she did need adjuvant chemotherapy, however, she refused and decided that she would pursue radiation.  She  received radiation and completed that on 08-14-02. Her subsequent history is as detailed below.   PAST MEDICAL HISTORY: Past Medical History:  Diagnosis Date  . Breast cancer ([place])   . DVT (deep venous thrombosis) ([place]) 10/07/2011  . Fever 02/06/2018  . Hypertension   . Radiation 11/29/2005-12/29/2005   right supraclavicular area 4147 cGy  . Radiation 06/26/02-08/14/02   right breast 5040 cGy, tumor bed boosted to 1260 cGy  . Rheumatic fever     PAST SURGICAL HISTORY: Past Surgical History:  Procedure Laterality Date  . BREAST SURGERY    . MASTECTOMY Bilateral   . PORT A CATH REVISION      FAMILY HISTORY Family History  Problem Relation Age of Onset  . Pancreatic cancer Mother   . Kidney cancer Sister 19  . Breast cancer Sister 102  . Breast cancer Other 84  The patient's father died in his 29H  from complications of alcohol abuse. The patient's mother died in her 12s from pancreatic cancer. The patient has 6 brothers, 2 sisters. Both her sisters have had breast cancer, both diagnosed after the age of 23. The patient has not had genetic testing so far.   GYNECOLOGIC HISTORY: Menarche age 61; first live birth age 23. She is GXP4. She is not sure when she stopped having periods. She never used hormone replacement.   SOCIAL HISTORY: The patient has worked in the past as a Psychologist, counselling. At home in addition to the patient are her husband [name], originally from [place], who works as a Administrator.  The other children are [name], who lives with the patient and has a college degree in psychology; she works as a Probation officer; no longer living in the home is her daughter [name] (she is studying to be a Marine scientist) and grandson.son [name], lives in [place] and works as a Higher education careers adviser. [name] also sometimes stays in the home. In addition the patient has an aide who helps her almost daily.    ADVANCED DIRECTIVES: The patient has completed advanced directives and name her daughter [name] as her healthcare power of attorney.  The patient also completed a living will.  This is notarized.  These documents are being sent for scanning on epic on 01/15/2020  HEALTH MAINTENANCE: Social History   Tobacco Use  . Smoking status: Former Research scientist (life sciences)  . Smokeless tobacco: Never Used  Substance Use Topics  . Alcohol use: Never    Comment: Rarely  . Drug use: No     Colonoscopy:  Not on file  PAP:  Not on file  Bone density:  Not on file  Lipid panel:  Not on file   Allergies  Allergen Reactions  . Tramadol Nausea Only  . Hydrocodone-Acetaminophen Itching and Rash  . Pork-Derived Products Other (See Comments)    Pt says she just doesn't eat pork; has no reaction to pork products    Current Outpatient Medications  Medication Sig Dispense Refill  . acetaminophen (TYLENOL) 325 MG tablet Take 2 tablets (650 mg total) by mouth every 6 (six) hours as needed for mild pain (or Fever >/= 101).    Marland Kitchen allopurinol (ZYLOPRIM) 300 MG tablet Take 300 mg by mouth daily.    Marland Kitchen  ALPRAZolam (XANAX) 1 MG tablet Take 1 tablet (1 mg total) by mouth at bedtime as needed for sleep. 15 tablet 0  . Ascorbic Acid (VITAMIN C PO) Take 1 tablet by mouth daily.    . cholecalciferol (VITAMIN D3) 25 MCG (1000 UNIT) tablet Take 1,000 Units by mouth daily.    Marland Kitchen dexamethasone (DECADRON) 2 MG tablet Take Decadron 2 mg daily for 7 days and then stop 7 tablet 0  . gabapentin (NEURONTIN) 100 MG capsule Take 1 capsule (300 mg total) by mouth 2 (two) times daily as needed. (Patient taking differently: Take 100 mg by mouth 2 (two) times daily. ) 60 capsule 3  . iron polysaccharides (NIFEREX) 150 MG capsule Take 1 capsule (150 mg total) by mouth daily. 30 capsule 0  . levETIRAcetam (KEPPRA) 500 MG tablet Take 1 tablet (500 mg total) by mouth 2 (two) times daily. 60 tablet 2  . MAGNESIUM PO Take 1 tablet by mouth daily.    . Multiple Vitamin (MULTIVITAMIN) capsule Take 1 capsule by mouth daily.      .  mupirocin ointment (BACTROBAN) 2 % Place 1 application into the nose 2 (two) times daily. 22 g 0  . omeprazole (PRILOSEC) 40 MG capsule Take 1 capsule (40 mg total) by mouth daily. (Patient taking differently: Take 40 mg by mouth daily as needed (heartburn). ) 30 capsule 1  . ondansetron (ZOFRAN) 4 MG tablet Take 1 tablet (4 mg total) by mouth 3 (three) times daily as needed. 20 tablet 3  . ondansetron (ZOFRAN) 4 MG tablet Take 1 tablet (4 mg total) by mouth every 6 (six) hours as needed for nausea. 20 tablet 0  . Potassium Chloride ER 20 MEQ TBCR Take 1 tablet by mouth daily.    . prochlorperazine (COMPAZINE) 10 MG tablet Take 1 tablet (10 mg total) by mouth every 6 (six) hours as needed for nausea or vomiting. 30 tablet 1  . sertraline (ZOLOFT) 100 MG tablet Take 1 tablet (100 mg total) by mouth daily. 90 tablet 2  . torsemide (DEMADEX) 20 MG tablet Take 10 mg by mouth daily.    Marland Kitchen triamterene-hydrochlorothiazide (DYAZIDE) 37.5-25 MG capsule Take 1 capsule by mouth daily.    . tucatinib (TUKYSA) 150 MG tablet Take 150 mg by mouth every morning.  60 tablet 3   Current Facility-Administered Medications  Medication Dose Route Frequency Provider Last Rate Last Admin  . sodium chloride flush (NS) 0.9 % injection 10 mL  10 mL Intravenous PRN Gardenia Phlegm, NP   10 mL at 04/01/20 1616   Facility-Administered Medications Ordered in Other Visits  Medication Dose Route Frequency Provider Last Rate Last Admin  . 0.9 %  sodium chloride infusion   Intravenous Once [name], [name], MD      . sodium chloride 0.9 % injection 10 mL  10 mL Intravenous PRN [name], [name], MD   10 mL at 03/04/14 1421  . sodium chloride flush (NS) 0.9 % injection 10 mL  10 mL Intracatheter PRN [name], [name], MD   10 mL at 07/30/19 1627  . sodium chloride flush (NS) 0.9 % injection 10 mL  10 mL Intracatheter PRN [name], [name], MD        OBJECTIVE: African-American woman examined in a  wheelchair  Vitals:   04/01/20 1021  BP: 123/72  Pulse: 63  Resp: 18  Temp: 98.5 F (36.9 C)  SpO2: 100%   Wt Readings from Last 3 Encounters:  04/01/20 202 lb  2 oz (91.7 kg)  03/22/20 219 lb 2.2 oz (99.4 kg)  03/16/20 201 lb 9.6 oz (91.4 kg)   Body mass index is 34.69 kg/m.   GENERAL: Patient is a well appearing female in no acute distress HEENT:  Sclerae anicteric. Mask in placeNeck is supple.  NODES:  No cervical, supraclavicular, or axillary lymphadenopathy palpated.  BREAST EXAM:  Deferred. Declined to put on gown today. LUNGS:  Clear to auscultation bilaterally.  No wheezes or rhonchi. HEART:  Regular rate and rhythm. No murmur appreciated. ABDOMEN:  Soft, nontender.  Positive, normoactive bowel sounds. No organomegaly palpated. MSK:  No focal spinal tenderness to palpation. EXTREMITIES:  No peripheral edema.   SKIN:  Clear with no obvious rashes or skin changes. No nail dyscrasia. Metastatic lesions on her back are stable to slightly improved. NEURO:  Nonfocal. Well oriented.  Appropriate affect.     LAB RESULTS:.  Lab Results  Component Value Date   WBC 8.4 04/01/2020   NEUTROABS 5.9 04/01/2020   HGB 10.2 (L) 04/01/2020   HCT 34.1 (L) 04/01/2020   MCV 90.7 04/01/2020   PLT 261 04/01/2020      Chemistry      Component Value Date/Time   NA 141 04/01/2020 1030   NA 142 11/09/2017 1214   K 3.0 (LL) 04/01/2020 1030   K 3.9 11/09/2017 1214   CL 101 04/01/2020 1030   CL 101 03/07/2013 1411   CO2 30 04/01/2020 1030   CO2 32 (H) 11/09/2017 1214   BUN 12 04/01/2020 1030   BUN 19.1 11/09/2017 1214   CREATININE 0.94 04/01/2020 1030   CREATININE 1.0 11/09/2017 1214      Component Value Date/Time   CALCIUM 8.9 04/01/2020 1030   CALCIUM 9.1 11/09/2017 1214   ALKPHOS 83 04/01/2020 1030   ALKPHOS 120 11/09/2017 1214   AST 20 04/01/2020 1030   AST 30 11/09/2017 1214   ALT 13 04/01/2020 1030   ALT 19 11/09/2017 1214   BILITOT 0.3 04/01/2020 1030   BILITOT  <0.22 11/09/2017 1214       STUDIES: CT Head Wo Contrast  Result Date: 03/20/2020 CLINICAL DATA:  Altered mental status, metastatic breast cancer EXAM: CT HEAD WITHOUT CONTRAST TECHNIQUE: Contiguous axial images were obtained from the base of the skull through the vertex without intravenous contrast. COMPARISON:  02/10/2020 FINDINGS: Brain: There is new hyperdensity associated with the right frontal metastasis reflecting small volume intralesional hemorrhage. There is unchanged hypoattenuation in the right temporal lobe at the site of other known metastasis. There is no significant mass effect. Ventricles are stable in size and configuration. No new loss of gray-white differentiation. Vascular: Negative. Skull: Calvarium is unremarkable. Sinuses/Orbits: No acute finding. Other: None. IMPRESSION: Small volume intralesional hemorrhage within treated right frontal metastasis. No significant mass effect. These results were called by telephone at the time of interpretation on 03/20/2020 at 12:25 pm to provider [name] , who verbally acknowledged these results. Electronically Signed   By: Macy Mis [name]   On: 03/20/2020 12:35   MR BRAIN W WO CONTRAST  Result Date: 03/20/2020 CLINICAL DATA:  Encephalopathy. Metastatic breast cancer. EXAM: MRI HEAD WITHOUT AND WITH CONTRAST TECHNIQUE: Multiplanar, multiecho pulse sequences of the brain and surrounding structures were obtained without and with intravenous contrast. CONTRAST:  10 mL Gadavist COMPARISON:  Head CT 03/20/2020 and MRI 01/31/2020 FINDINGS: Multiple sequences are moderately to severely motion degraded. Brain: A heterogeneously enhancing right temporal lobe mass has not significantly changed in size from the  prior MRI, measuring 4.9 x 2.6 x 2.1 cm (AP x transverse x craniocaudal) (previously 4.9 x 2.4 x 2.0 cm). There is persistent mild surrounding T2 hyperintensity/edema which is grossly similar to the prior MRI. Associated chronic blood  products are noted. A hemorrhagic 2.7 x 2.5 cm right frontal mass has slightly increased in size although the amount of enhancement has decreased, and there is no significant edema. No new intracranial metastases are identified, however small lesions could easily be obscured by motion. No acute infarct, midline shift, or extra-axial fluid collection is identified. A mildly expanded partially empty sella is again noted. Mild cerebral atrophy is unchanged. Background mild chronic small vessel ischemia in the cerebral white matter is similar to the prior MRI. Vascular: Major intracranial vascular flow voids are preserved. Skull and upper cervical spine: Chronically diminished bone marrow T1 signal intensity diffusely which may be related to the patient's known anemia. No grossly destructive skull lesion. Sinuses/Orbits: Unremarkable orbits. Clear paranasal sinuses. Increased, large left and moderate right mastoid effusions. Other: None. IMPRESSION: 1. Severely motion degraded examination. 2. Slightly increased size of hemorrhagic right frontal metastasis with decreased enhancement. No significant edema. 3. Unchanged right temporal metastasis. 4. No new intracranial metastases or acute infarct identified. 5. Increased size of left larger than right mastoid effusions. Electronically Signed   By: Logan Bores [name]   On: 03/20/2020 18:26   DG Chest Port 1 View  Result Date: 03/20/2020 CLINICAL DATA:  Altered mental status EXAM: PORTABLE CHEST 1 VIEW COMPARISON:  02/10/2020 FINDINGS: Left chest wall port catheter is unchanged. Low lung volumes. Mild interstitial prominence. No significant pleural effusion. No pneumothorax. Cardiomediastinal contours are likely within normal limits for technique. IMPRESSION: Low lung volumes. Nonspecific mild interstitial prominence, which could be chronic or reflect mild superimposed edema. Electronically Signed   By: Macy Mis [name]   On: 03/20/2020 12:07   EEG adult  Result  Date: 03/23/2020 [name] Havens, MD     03/23/2020  3:50 PM Patient Name: [name] MRN: [id number] Epilepsy Attending: Lora [name] Referring Physician/Provider: Dr Antonieta [name] Date: 03/23/2020 Duration: 23.51 mins Patient history: 64yo F with right frontal and temporal brain mets and ams. EEG to evaluate for seizure. Level of alertness: Awake, asleep AEDs during EEG study: Gabapentin, LEV Technical aspects: This EEG study was done with scalp electrodes positioned according to the 10-20 International system of electrode placement. Electrical activity was acquired at a sampling rate of '500Hz'  and reviewed with a high frequency filter of '70Hz'  and a low frequency filter of '1Hz' . EEG data were recorded continuously and digitally stored. Description: The posterior dominant rhythm consists of '8Hz'  activity of moderate voltage (25-35 uV) seen predominantly in posterior head regions, symmetric and reactive to eye opening and eye closing.  Sleep was characterized by vertex waves, sleep spindles (12 to 14 Hz), maximal frontocentral region.  Hyperventilation and photic stimulation were not performed.   IMPRESSION: This study is within normal limits. No seizures or epileptiform discharges were seen throughout the recording. [name]   VAS [name] LOWER EXTREMITY VENOUS (DVT)  Result Date: 03/23/2020  Lower Venous DVTStudy Indications: Swelling, and Edema.  Comparison Study: 01/21/20 previous Performing Technologist: [name]  Examination Guidelines: A complete evaluation includes B-mode imaging, spectral Doppler, color Doppler, and power Doppler as needed of all accessible portions of each vessel. Bilateral testing is considered an integral part of a complete examination. Limited examinations for reoccurring indications may be performed as noted. The reflux  portion of the exam is performed with the patient in reverse Trendelenburg.  +---------+---------------+---------+-----------+----------+--------------+  RIGHT    CompressibilityPhasicitySpontaneityPropertiesThrombus Aging +---------+---------------+---------+-----------+----------+--------------+ CFV      Full           Yes      Yes                                 +---------+---------------+---------+-----------+----------+--------------+ SFJ      Full                                                        +---------+---------------+---------+-----------+----------+--------------+ FV Prox  Full                                                        +---------+---------------+---------+-----------+----------+--------------+ FV Mid   Full                                                        +---------+---------------+---------+-----------+----------+--------------+ FV Distal               Yes      Yes                                 +---------+---------------+---------+-----------+----------+--------------+ PFV      Full                                                        +---------+---------------+---------+-----------+----------+--------------+ POP      Full           Yes      Yes                                 +---------+---------------+---------+-----------+----------+--------------+ PTV      Full                                                        +---------+---------------+---------+-----------+----------+--------------+ PERO     Full                                                        +---------+---------------+---------+-----------+----------+--------------+   +----+---------------+---------+-----------+----------+--------------+ LEFTCompressibilityPhasicitySpontaneityPropertiesThrombus Aging +----+---------------+---------+-----------+----------+--------------+ CFV Full           Yes      Yes                                 +----+---------------+---------+-----------+----------+--------------+  Summary: RIGHT: - No evidence of deep vein thrombosis in the lower  extremity. No indirect evidence of obstruction proximal to the inguinal ligament. - No cystic structure found in the popliteal fossa.  LEFT: - No evidence of common femoral vein obstruction.  *See table(s) above for measurements and observations. Electronically signed by [name] Martinez MD on 03/23/2020 at 5:34:38 PM.    Final    VAS [name] UPPER EXTREMITY VENOUS DUPLEX  Result Date: 03/23/2020 UPPER VENOUS STUDY  Indications: Swelling, and Edema Comparison Study: 02/09/15 previous Performing Technologist: [name]  Examination Guidelines: A complete evaluation includes B-mode imaging, spectral Doppler, color Doppler, and power Doppler as needed of all accessible portions of each vessel. Bilateral testing is considered an integral part of a complete examination. Limited examinations for reoccurring indications may be performed as noted.  Right Findings: +----------+------------+---------+-----------+----------+-------+ RIGHT     CompressiblePhasicitySpontaneousPropertiesSummary +----------+------------+---------+-----------+----------+-------+ IJV           Full       Yes       Yes                      +----------+------------+---------+-----------+----------+-------+ Subclavian    Full       Yes       Yes                      +----------+------------+---------+-----------+----------+-------+ Axillary      Full       Yes       Yes                      +----------+------------+---------+-----------+----------+-------+ Brachial      Full       Yes       Yes                      +----------+------------+---------+-----------+----------+-------+ Radial        Full                                          +----------+------------+---------+-----------+----------+-------+ Ulnar         Full                                          +----------+------------+---------+-----------+----------+-------+ Cephalic      Full                                           +----------+------------+---------+-----------+----------+-------+ Basilic       Full                                          +----------+------------+---------+-----------+----------+-------+  Summary:  Right: No evidence of deep vein thrombosis in the upper extremity. No evidence of superficial vein thrombosis in the upper extremity.  *See table(s) above for measurements and observations.  Diagnosing physician: [name] Martinez MD Electronically signed by [name] Martinez MD on 03/23/2020 at 5:33:17 PM.    Final      ASSESSMENT:  64 y.o. [place] woman with stage IV breast cancer  manifested chiefly by loco-regional nodal disease (neck, chest) and skin involvement, without liver, bone, or brain metastases documented   (1) Status post right upper inner quadrant lumpectomy and sentinel lymph node sampling 03/04/2002 for 2 separate foci of invasive ductal carcinoma, mpT2 pN1 or stage IIB, both foci grade 3, both estrogen and progesterone receptor positive, both HercepTest 3+, Mib-1 56%  (2) Reexcision for margins 05/27/2002 showed no residual cancer in the breast.  (3) The patient refused adjuvant systemic therapy.  (4) Adjuvant radiation treatment completed 08/14/2002.  (5) recurrence in the right breast in 02/2004 showing a morphologically different tumor, again grade 3, again estrogen and progesterone receptor negative, with an MIB-1 of 14% and Herceptest 3+.  (5) Between 03/2004 and 07/2004 she received dose dense Doxorubicin/Cyclophosphamide x 4 given with trastuzumab, followed by weekly Docetaxel x 8, again given with trastuzumab.  (6) Right mastectomy 07/13/2004 showed scattered microscopic foci of residual disease over an area  greater than 5 cm. Margins were negative.  (7) Postoperative Docetaxel continued until 09/2004.  (8) Trastuzumab (Herceptin) given 08/2004 through 01/2012 with some brief interruptions.  RECURRENT/ STAGE IV DISEASE December 2006 (9) Isolated right  cervical nodal recurrence 10/2005, treated with radiation to the right supraclavicular area (total 41.5 gray) completed 12/29/2005.  (10) Navelbine given together with Herceptin  11/2005 through 03/2006.  (9) Left mastectomy 02/13/2006 for ductal carcinoma in situ, grade 2, estrogen and progesterone receptor negative, with negative margins; 0 of 3 lymph nodes involved  (10) treated with Lapatinib and Capecitabine before 10/2009, for an unclear duration and with unclear results (cannot locate data on chart review).  (11) Status post right supraclavicular lymph node biopsy 09/2010 again positive for an invasive ductal carcinoma, estrogen and progesterone receptor negative, HER-2 positive by [facility] with a ratio 4.25.  (12) Navelbine given together with Herceptin between 05/2011 and 11/2011.  (13) Carboplatin/ Gemcitabine/ Herceptin given for 2 cycles, in 12/2011 and 01/2012.  (14) TDM-1 ([place]) started 02/2012. Last dose 10/02/2013 after which the patient discontinued treatment at her own discretion. Echo on December 2014 showed a well preserved ejection fraction.  (15) Deep vein thrombosis of the right upper extremity documented 04/20/2011.  She completed anticoagulant therapy with Coumadin on 03/25/2013.  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 Left chest Port-A-Cath placed on 04/08/2013; being flushed every 6 weeks  RIGHT UPPER EXTREMITY ANGIOSARCOMA VS BREAST CANCER: August 2016 (18) treated at cancer centers of [place] August 2016 with paclitaxel, trastuzumab and pertuzumab 1 cycle, afterwards referred to hospice  (19) under the care of hospice of [facility] August 2016 to 05/08/2016, when the patient opted for a second try at chemotherapy  (20)  started low-dose Abraxane, trastuzumab and pertuzumab 06/07/2016, to be repeated every 4 weeks.   (a) pretreatment echocardiogram 06/06/2016 showed a 60-65% ejection fraction  (b) significant compliance problems compromise response to treatment  (c)  echocardiogram 02/20/17 LVEF 55-60%  (d) Abraxane discontinued 07/04/2017 because of possible neuropathy symptoms  (e) Abraxane resumed 09/27/2017  (f) echocardiogram 07/20/2017 showed an ejection fraction of 60-65%  (g) echo 01/22/2018 showed an ejection fraction of 50-55%  (h) CT scan of the chest 05/03/2018 shows no disease involving the lungs liver or lymph nodes, with stable subcutaneous masses as previously noted  (I) Abraxane discontinued August 2019 because of concerns  regarding neuropathy-- gemcitabine substituted  (j) echocardiogram 08/20/2018 shows an ejection fraction in the 55-60% range (done every 6 months)  (k) gemcitabine discontinued because of multiple symptoms, Abraxane restarted 09/11/2018, given once every 4 weeks  (l) echocardiogram 07/17/2019 shows a well-preserved ejection fraction at 55-60%  (m) restaging PET scan 08/20/2019 shows hypermetabolic adenopathy, subcutaneous malignancy as clinically appreciated, but no liver or lung parenchymal lesions   (21) Chronic pain secondary to known metastatic disease  (a) patient signed pain contract 10/19/2016  (b) as of 11/16/2016 receives her pain medications from [name] [name]  (c)  [name] [name] no longer able to prescribe as of February 2019  (d) all pain medications now through [facility], [name] Mirna Mires (as of April 2019)  (22) left breast and left axillary adenopathy noted clinically  (a) left axillary lymph node biopsy on 07/24/2019 benign  (23) BRAIN METASTASES: frontal and temporal lobe metastases noted on brain MRI 09/04/2019  (a) adjuvant radiation 09/11/2019-10/16/2019:    The brain was treated to 30 Gy in 10 fractions.   (b) tucatinib prescribed December 2020 at  150 mg daily because of methadone interaction, finally started March 2021 but taken irregularly by the patient.  (c) noncontrast head CT 01/12/2020 stable lesions/improved edema  (d) brain MRI 02/01/2020 shows decreased size of the right frontal and temporal lobe metastases with mild residual edema, no mass-effect, and no evidence of new mets  (e) noncontrast MRI of the cervical, thoracic, and lumbar spine 02/11/2020 and 02/12/2020 showed no metastatic disease  (24) COVID-19 infection 2019-11-19   PLAN: [name] is here today to continue on her treatment for her metastatic breast cancer with Abraxane, Trastuzumab, and Pertuzumab.  She is tolerating this well and will continues this every four weeks.  [name] is working with home health and will continue to do so.  She met with [name]. Jana [name] who reviewed her recent hospitalization with her.  She was recommended to continue with her current therapy.  Since she has remained confused, we will go ahead and keep the repeat brain MRI as scheduled on 04/16/2020 for f/u.    [name] in 4 weeks for labs, f/u with [name]. Jana [name], and her next treatment.  She was recommended to continue with the appropriate pandemic precautions. She knows to call for any questions that may arise between now and her next appointment.  We are happy to see her sooner if needed.   Total encounter time 20 minutes.[name] Bihari, NP 04/01/20 4:31 PM Medical Oncology and Hematology [facility] [place], [place] [address] Tel. [phone]    Fax. [phone]   ADDENDUM: Breniyah is declining.  She is basically at assistant living level now.  This places a great strain on the family but they seem to be doing a good job at making sure she takes the right medications.  Even if Ayame does not have progression in the brain it is certainly possible that the treatment she has received and the cancer she did have there while active have  caused significant cognitive impairment.  She is certainly still able to make decisions however.  She continues to be oriented to person place and time.  It is also possible that she is using other medications or substances that she is not telling us about.  In any case I think it is a good idea to proceed with MRI 04/16/2020 as planned.  We will continue to treat her peripheral disease as before.  Her  pain management will continue to be through [name]. Mirna Mires.  I personally saw this patient and performed a substantive portion of this encounter with the listed APP documented above.   Chauncey Cruel, MD Medical Oncology and Hematology [facility] [address] [place], [place] [address] Tel. [phone]    Fax. [phone]    *Total Encounter Time as defined by the Centers for Medicare and Medicaid Services includes, in addition to the face-to-face time of a patient visit (documented in the note above) non-face-to-face time: obtaining and reviewing outside history, ordering and reviewing medications, tests or procedures, care coordination (communications with other health care professionals or caregivers) and documentation in the medical record.

## 2020-03-31 NOTE — Progress Notes (Signed)
[place]  CSW received clarification that a new referral for [employer] services needs to be made. When patient was reassessed a few months ago, she lost hours as it appeared she did not qualify. However, that is not the case and she appealed and was restored to 75 hrs/week. A new referral needs to be made for her to keep these hours. Form given to RN to fill out and will be faxed to [facility] when completed.   Edwinna Areola Kolby Myung, LCSW

## 2020-04-01 ENCOUNTER — Other Ambulatory Visit: Payer: Self-pay | Admitting: *Deleted

## 2020-04-01 ENCOUNTER — Other Ambulatory Visit: Payer: Self-pay

## 2020-04-01 ENCOUNTER — Inpatient Hospital Stay: Payer: Medicare Other

## 2020-04-01 ENCOUNTER — Telehealth: Payer: Self-pay | Admitting: Oncology

## 2020-04-01 ENCOUNTER — Inpatient Hospital Stay (HOSPITAL_BASED_OUTPATIENT_CLINIC_OR_DEPARTMENT_OTHER): Payer: Medicare Other | Admitting: Adult Health

## 2020-04-01 VITALS — BP 139/82 | HR 74 | Temp 98.5°F | Resp 16

## 2020-04-01 VITALS — BP 123/72 | HR 63 | Temp 98.5°F | Resp 18 | Ht 64.0 in | Wt 202.1 lb

## 2020-04-01 DIAGNOSIS — Z9221 Personal history of antineoplastic chemotherapy: Secondary | ICD-10-CM | POA: Diagnosis not present

## 2020-04-01 DIAGNOSIS — Z8616 Personal history of covid-19: Secondary | ICD-10-CM | POA: Diagnosis not present

## 2020-04-01 DIAGNOSIS — Z9013 Acquired absence of bilateral breasts and nipples: Secondary | ICD-10-CM | POA: Diagnosis not present

## 2020-04-01 DIAGNOSIS — C50211 Malignant neoplasm of upper-inner quadrant of right female breast: Secondary | ICD-10-CM

## 2020-04-01 DIAGNOSIS — E876 Hypokalemia: Secondary | ICD-10-CM

## 2020-04-01 DIAGNOSIS — C4911 Malignant neoplasm of connective and soft tissue of right upper limb, including shoulder: Secondary | ICD-10-CM

## 2020-04-01 DIAGNOSIS — G893 Neoplasm related pain (acute) (chronic): Secondary | ICD-10-CM | POA: Diagnosis not present

## 2020-04-01 DIAGNOSIS — I890 Lymphedema, not elsewhere classified: Secondary | ICD-10-CM | POA: Diagnosis not present

## 2020-04-01 DIAGNOSIS — C7931 Secondary malignant neoplasm of brain: Secondary | ICD-10-CM | POA: Diagnosis not present

## 2020-04-01 DIAGNOSIS — C770 Secondary and unspecified malignant neoplasm of lymph nodes of head, face and neck: Secondary | ICD-10-CM | POA: Diagnosis not present

## 2020-04-01 DIAGNOSIS — R590 Localized enlarged lymph nodes: Secondary | ICD-10-CM | POA: Diagnosis not present

## 2020-04-01 DIAGNOSIS — Z170 Estrogen receptor positive status [ER+]: Secondary | ICD-10-CM | POA: Diagnosis not present

## 2020-04-01 DIAGNOSIS — Z87891 Personal history of nicotine dependence: Secondary | ICD-10-CM | POA: Diagnosis not present

## 2020-04-01 DIAGNOSIS — Z95828 Presence of other vascular implants and grafts: Secondary | ICD-10-CM

## 2020-04-01 DIAGNOSIS — Z86718 Personal history of other venous thrombosis and embolism: Secondary | ICD-10-CM | POA: Diagnosis not present

## 2020-04-01 DIAGNOSIS — Z923 Personal history of irradiation: Secondary | ICD-10-CM | POA: Diagnosis not present

## 2020-04-01 DIAGNOSIS — R410 Disorientation, unspecified: Secondary | ICD-10-CM | POA: Diagnosis not present

## 2020-04-01 DIAGNOSIS — R5383 Other fatigue: Secondary | ICD-10-CM | POA: Diagnosis not present

## 2020-04-01 DIAGNOSIS — Z171 Estrogen receptor negative status [ER-]: Secondary | ICD-10-CM | POA: Diagnosis not present

## 2020-04-01 DIAGNOSIS — Z5112 Encounter for antineoplastic immunotherapy: Secondary | ICD-10-CM | POA: Diagnosis present

## 2020-04-01 DIAGNOSIS — Z5111 Encounter for antineoplastic chemotherapy: Secondary | ICD-10-CM | POA: Diagnosis not present

## 2020-04-01 LAB — CBC WITH DIFFERENTIAL (CANCER CENTER ONLY)
Abs Immature Granulocytes: 0.13 10*3/uL — ABNORMAL HIGH (ref 0.00–0.07)
Basophils Absolute: 0.1 10*3/uL (ref 0.0–0.1)
Basophils Relative: 1 %
Eosinophils Absolute: 0.2 10*3/uL (ref 0.0–0.5)
Eosinophils Relative: 3 %
HCT: 34.1 % — ABNORMAL LOW (ref 36.0–46.0)
Hemoglobin: 10.2 g/dL — ABNORMAL LOW (ref 12.0–15.0)
Immature Granulocytes: 2 %
Lymphocytes Relative: 14 %
Lymphs Abs: 1.2 10*3/uL (ref 0.7–4.0)
MCH: 27.1 pg (ref 26.0–34.0)
MCHC: 29.9 g/dL — ABNORMAL LOW (ref 30.0–36.0)
MCV: 90.7 fL (ref 80.0–100.0)
Monocytes Absolute: 0.8 10*3/uL (ref 0.1–1.0)
Monocytes Relative: 10 %
Neutro Abs: 5.9 10*3/uL (ref 1.7–7.7)
Neutrophils Relative %: 70 %
Platelet Count: 261 10*3/uL (ref 150–400)
RBC: 3.76 MIL/uL — ABNORMAL LOW (ref 3.87–5.11)
RDW: 17 % — ABNORMAL HIGH (ref 11.5–15.5)
WBC Count: 8.4 10*3/uL (ref 4.0–10.5)
nRBC: 0 % (ref 0.0–0.2)

## 2020-04-01 LAB — CMP (CANCER CENTER ONLY)
ALT: 13 U/L (ref 0–44)
AST: 20 U/L (ref 15–41)
Albumin: 3.3 g/dL — ABNORMAL LOW (ref 3.5–5.0)
Alkaline Phosphatase: 83 U/L (ref 38–126)
Anion gap: 10 (ref 5–15)
BUN: 12 mg/dL (ref 8–23)
CO2: 30 mmol/L (ref 22–32)
Calcium: 8.9 mg/dL (ref 8.9–10.3)
Chloride: 101 mmol/L (ref 98–111)
Creatinine: 0.94 mg/dL (ref 0.44–1.00)
GFR, Est AFR Am: >60 mL/min (ref >60)
GFR, Estimated: >60 mL/min (ref >60)
Glucose, Bld: 112 mg/dL — ABNORMAL HIGH (ref 70–99)
Potassium: 3 mmol/L — CL (ref 3.5–5.1)
Sodium: 141 mmol/L (ref 135–145)
Total Bilirubin: 0.3 mg/dL (ref 0.3–1.2)
Total Protein: 6.6 g/dL (ref 6.5–8.1)

## 2020-04-01 MED ORDER — DIPHENHYDRAMINE HCL 25 MG PO CAPS
ORAL_CAPSULE | ORAL | Status: AC
  Filled 2020-04-01: qty 1

## 2020-04-01 MED ORDER — SODIUM CHLORIDE 0.9 % IV SOLN
420.0000 mg | Freq: Once | INTRAVENOUS | Status: AC
  Administered 2020-04-01: 420 mg via INTRAVENOUS
  Filled 2020-04-01: qty 14

## 2020-04-01 MED ORDER — SODIUM CHLORIDE 0.9 % IV SOLN
Freq: Once | INTRAVENOUS | Status: AC
  Filled 2020-04-01: qty 250

## 2020-04-01 MED ORDER — SODIUM CHLORIDE 0.9% FLUSH
10.0000 mL | INTRAVENOUS | Status: DC | PRN
  Filled 2020-04-01: qty 10

## 2020-04-01 MED ORDER — ACETAMINOPHEN 325 MG PO TABS
ORAL_TABLET | ORAL | Status: AC
  Filled 2020-04-01: qty 2

## 2020-04-01 MED ORDER — SODIUM CHLORIDE 0.9% FLUSH
10.0000 mL | INTRAVENOUS | Status: DC | PRN
  Administered 2020-04-01 (×2): 10 mL via INTRAVENOUS
  Filled 2020-04-01: qty 10

## 2020-04-01 MED ORDER — TRASTUZUMAB-ANNS CHEMO 150 MG IV SOLR
450.0000 mg | Freq: Once | INTRAVENOUS | Status: AC
  Administered 2020-04-01: 450 mg via INTRAVENOUS
  Filled 2020-04-01: qty 21.43

## 2020-04-01 MED ORDER — PROCHLORPERAZINE MALEATE 10 MG PO TABS
ORAL_TABLET | ORAL | Status: AC
  Filled 2020-04-01: qty 1

## 2020-04-01 MED ORDER — POTASSIUM CHLORIDE 10 MEQ/100ML IV SOLN
INTRAVENOUS | Status: AC
  Filled 2020-04-01: qty 100

## 2020-04-01 MED ORDER — SODIUM CHLORIDE 0.9 % IV SOLN
Freq: Once | INTRAVENOUS | Status: DC
  Filled 2020-04-01: qty 250

## 2020-04-01 MED ORDER — POTASSIUM CHLORIDE 10 MEQ/100ML IV SOLN
10.0000 meq | Freq: Once | INTRAVENOUS | Status: AC
  Administered 2020-04-01: 10 meq via INTRAVENOUS

## 2020-04-01 MED ORDER — HEPARIN SOD (PORK) LOCK FLUSH 100 UNIT/ML IV SOLN
500.0000 [IU] | Freq: Once | INTRAVENOUS | Status: AC | PRN
  Administered 2020-04-01: 500 [IU]
  Filled 2020-04-01: qty 5

## 2020-04-01 MED ORDER — DIPHENHYDRAMINE HCL 25 MG PO CAPS
25.0000 mg | ORAL_CAPSULE | Freq: Once | ORAL | Status: AC
  Administered 2020-04-01: 25 mg via ORAL

## 2020-04-01 MED ORDER — PACLITAXEL PROTEIN-BOUND CHEMO INJECTION 100 MG
100.0000 mg/m2 | Freq: Once | INTRAVENOUS | Status: AC
  Administered 2020-04-01: 200 mg via INTRAVENOUS
  Filled 2020-04-01: qty 40

## 2020-04-01 MED ORDER — PROCHLORPERAZINE MALEATE 10 MG PO TABS
10.0000 mg | ORAL_TABLET | Freq: Once | ORAL | Status: AC
  Administered 2020-04-01: 10 mg via ORAL

## 2020-04-01 MED ORDER — ACETAMINOPHEN 325 MG PO TABS
650.0000 mg | ORAL_TABLET | Freq: Once | ORAL | Status: AC
  Administered 2020-04-01: 650 mg via ORAL

## 2020-04-01 NOTE — Telephone Encounter (Signed)
Scheduled appts per 5/26 los. Gave pt a print out of AVS and appt calendar.

## 2020-04-01 NOTE — Patient Instructions (Signed)
[place] Discharge Instructions for Patients Receiving Chemotherapy  Today you received the following chemotherapy agents abraxane; kanjinti; perjeta  To help prevent nausea and vomiting after your treatment, we encourage you to take your nausea medication as directed   If you develop nausea and vomiting that is not controlled by your nausea medication, call the clinic.   BELOW ARE SYMPTOMS THAT SHOULD BE REPORTED IMMEDIATELY:  *FEVER GREATER THAN 100.5 F  *CHILLS WITH OR WITHOUT FEVER  NAUSEA AND VOMITING THAT IS NOT CONTROLLED WITH YOUR NAUSEA MEDICATION  *UNUSUAL SHORTNESS OF BREATH  *UNUSUAL BRUISING OR BLEEDING  TENDERNESS IN MOUTH AND THROAT WITH OR WITHOUT PRESENCE OF ULCERS  *URINARY PROBLEMS  *BOWEL PROBLEMS  UNUSUAL RASH Items with * indicate a potential emergency and should be followed up as soon as possible.  Feel free to call the clinic should you have any questions or concerns. The clinic phone number is [phone].  Please show the [place] at check-in to the Emergency Department and triage nurse.

## 2020-04-02 ENCOUNTER — Telehealth: Payer: Self-pay | Admitting: *Deleted

## 2020-04-02 NOTE — Telephone Encounter (Signed)
This RN received VM from PT and then call from the patient stating due to pain she is unable to do the physical therapy.  Return number given as [phone] for the Physical Therapist.  [name] left her home number of [phone].  Of note per discharge [name] was advised to not take methadone, oxycodone .  Gabapentin instructions are not clear as ordered 100mg  tab  Dose to take is 300mg  or 1 tab a day.  This RN will discuss above with MD

## 2020-04-03 IMAGING — CT CT CERVICAL SPINE W/O CM
5 of 8 series · 12 of 33 positions shown, 13 images · non-contrast
Comparison: Brain MRI dated 09/04/2019 and CT dated 02/06/2018.

CLINICAL DATA: 63-year-old female with head trauma. History of
breast cancer with metastases to the brain.

EXAM:
CT HEAD WITHOUT CONTRAST
CT CERVICAL SPINE WITHOUT CONTRAST
TECHNIQUE: Multidetector CT imaging of the head and cervical spine was
performed following the standard protocol without intravenous
contrast. Multiplanar CT image reconstructions of the cervical spine
were also generated.

[Series 4: head bone · axial · 0.47mm/px · z∈[+1347,+1401]mm · 2 of 83 slices shown]
[im 28/83  bone]
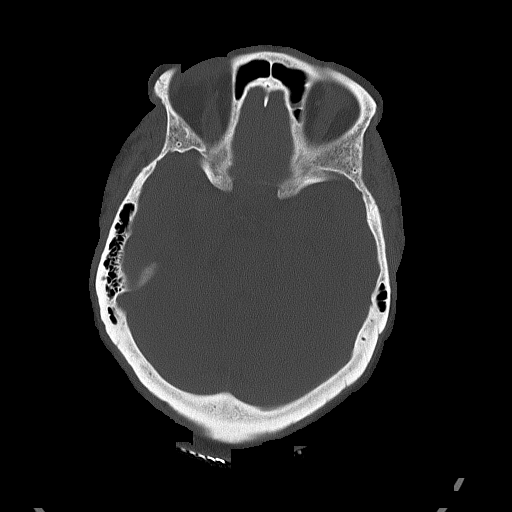
[im 55/83  bone]
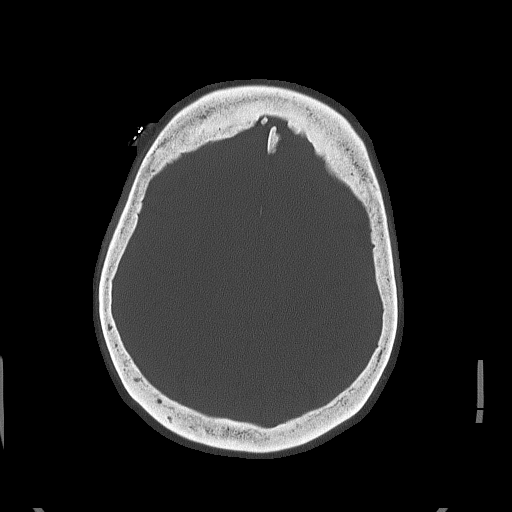

[Series 8: c spine soft · axial · 0.28mm/px · z∈[+1216,+1274]mm · 2 of 88 slices shown]
[im 30/88  soft-tissue]
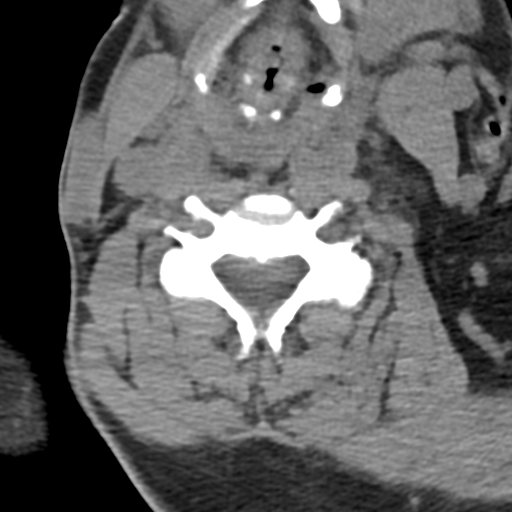
[im 59/88  soft-tissue]
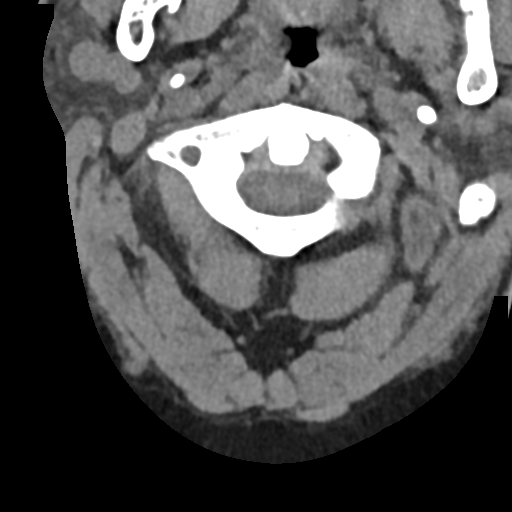

[Series 11: sagittal bone · sagittal · 0.26mm/px · 5 of 58 slices shown]
[im 10/58  bone]
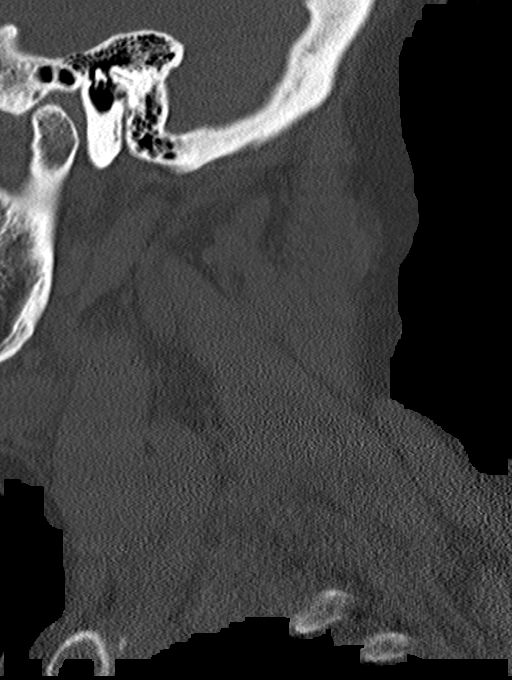
[im 20/58  bone]
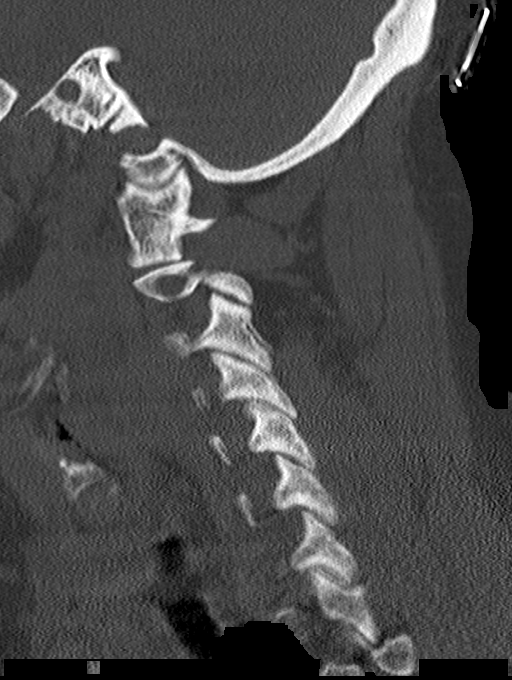
[im 29/58  bone]
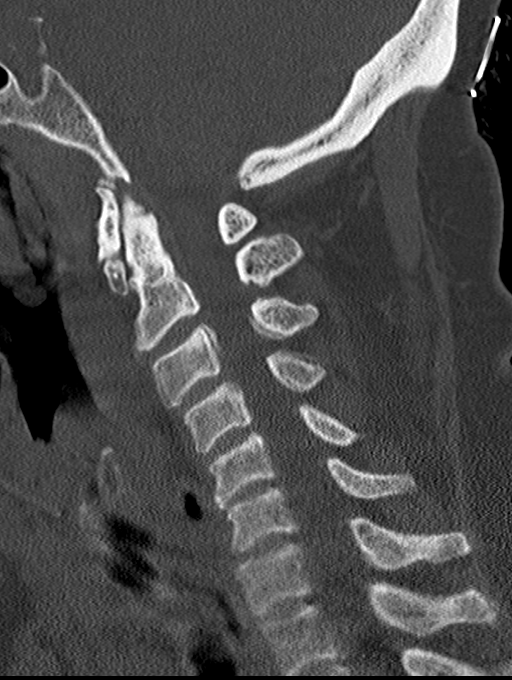
[im 39/58  bone]
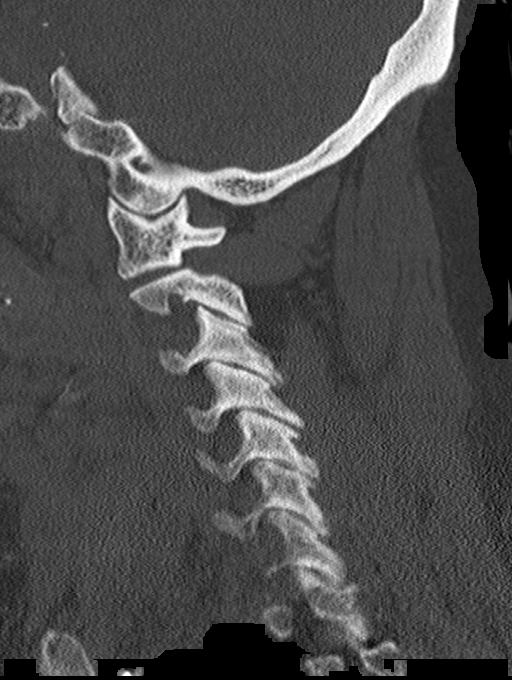
[im 48/58  bone]
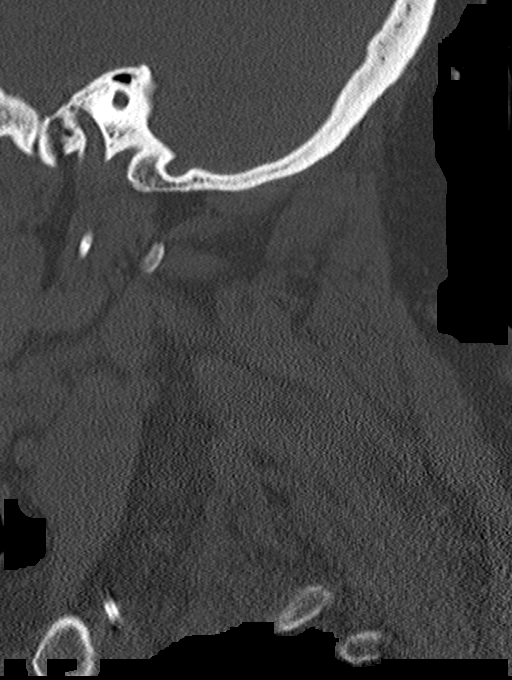

[Series 12: coronal bone · coronal · 0.26mm/px · 1 of 61 slices shown]
[im 31/61  bone]
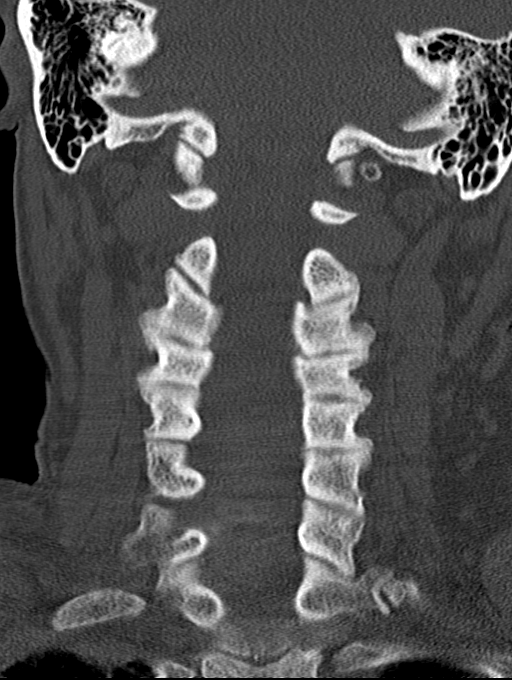

[Series 14: orthogonal bone · axial · 0.21mm/px · z∈[+1193,+1234]mm · 2 of 91 slices shown, 3 images]
[im 31/91  soft-tissue]
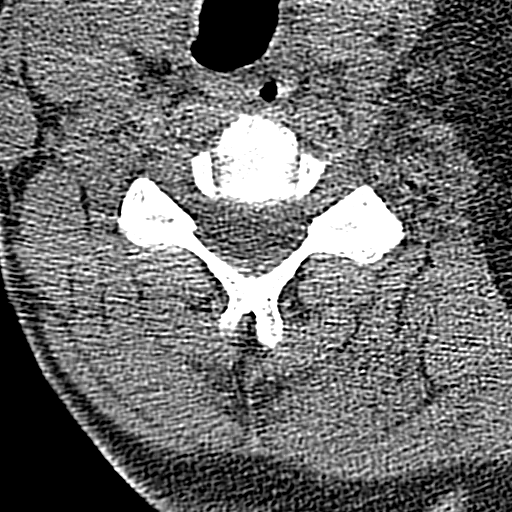
[im 31/91  bone]
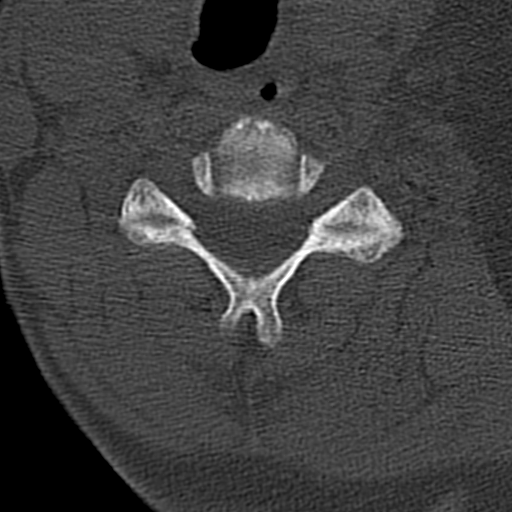
[im 61/91  bone]
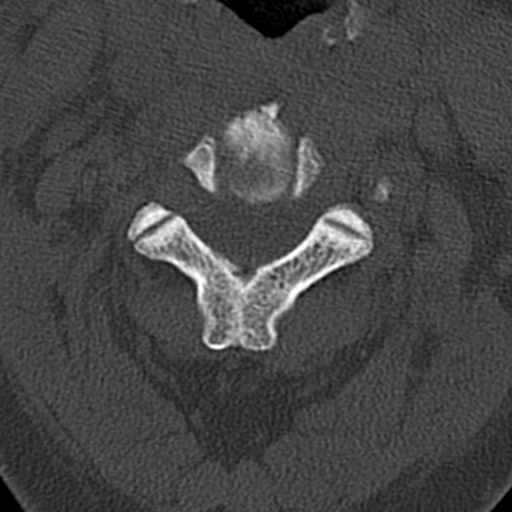

[12 of 33 positions shown; findings below may reference images not displayed]

FINDINGS: CT HEAD FINDINGS

Brain: Is defined right frontal lobe mass measuring 18 x 14 mm.
There is associated moderate surrounding vasogenic edema. There is
edema also in the right temporal lobe likely secondary to underlying
mass which is suboptimally seen on this noncontrast CT but in
keeping with known history of antacids cyst. There is no mass effect
or midline shift. The ventricles and sulci are otherwise appropriate
size for patient's age. The gray-white matter discrimination is
preserved. There is no acute intracranial hemorrhage. No extra-axial
fluid collection. There is an empty sella turcica.

Vascular: No hyperdense vessel or unexpected calcification.

Skull: Normal. Negative for fracture or focal lesion.

Sinuses/Orbits: Mild mucoperiosteal thickening of paranasal sinuses.
No air-fluid level. The mastoid air cells are clear.

Other: None

CT CERVICAL SPINE FINDINGS

Alignment: No acute subluxation. There is straightening of normal
cervical lordosis which may be positional or due to muscle spasm.

Skull base and vertebrae: No acute fracture.

Soft tissues and spinal canal: No prevertebral fluid or swelling. No
visible canal hematoma.

Disc levels:  Mild degenerative changes.

Upper chest: Negative.

Other: Partially visualized Port-A-Cath.
IMPRESSION: 1. No acute intracranial hemorrhage.
2. Right frontal and right parietal edema as seen on the prior MRI
and in keeping with known metastatic disease.
3. No acute/traumatic cervical spine pathology.

## 2020-04-03 IMAGING — DX DG CHEST 1V PORT
1 series · 1 of 1 positions shown · non-contrast
Comparison: Chest x-ray 10/11/2019.

CLINICAL DATA: 63-year-old female with history of cough and
hypoxia. Found unresponsive by family. Possible aspiration.

EXAM:
PORTABLE CHEST 1 VIEW

[chest ap]
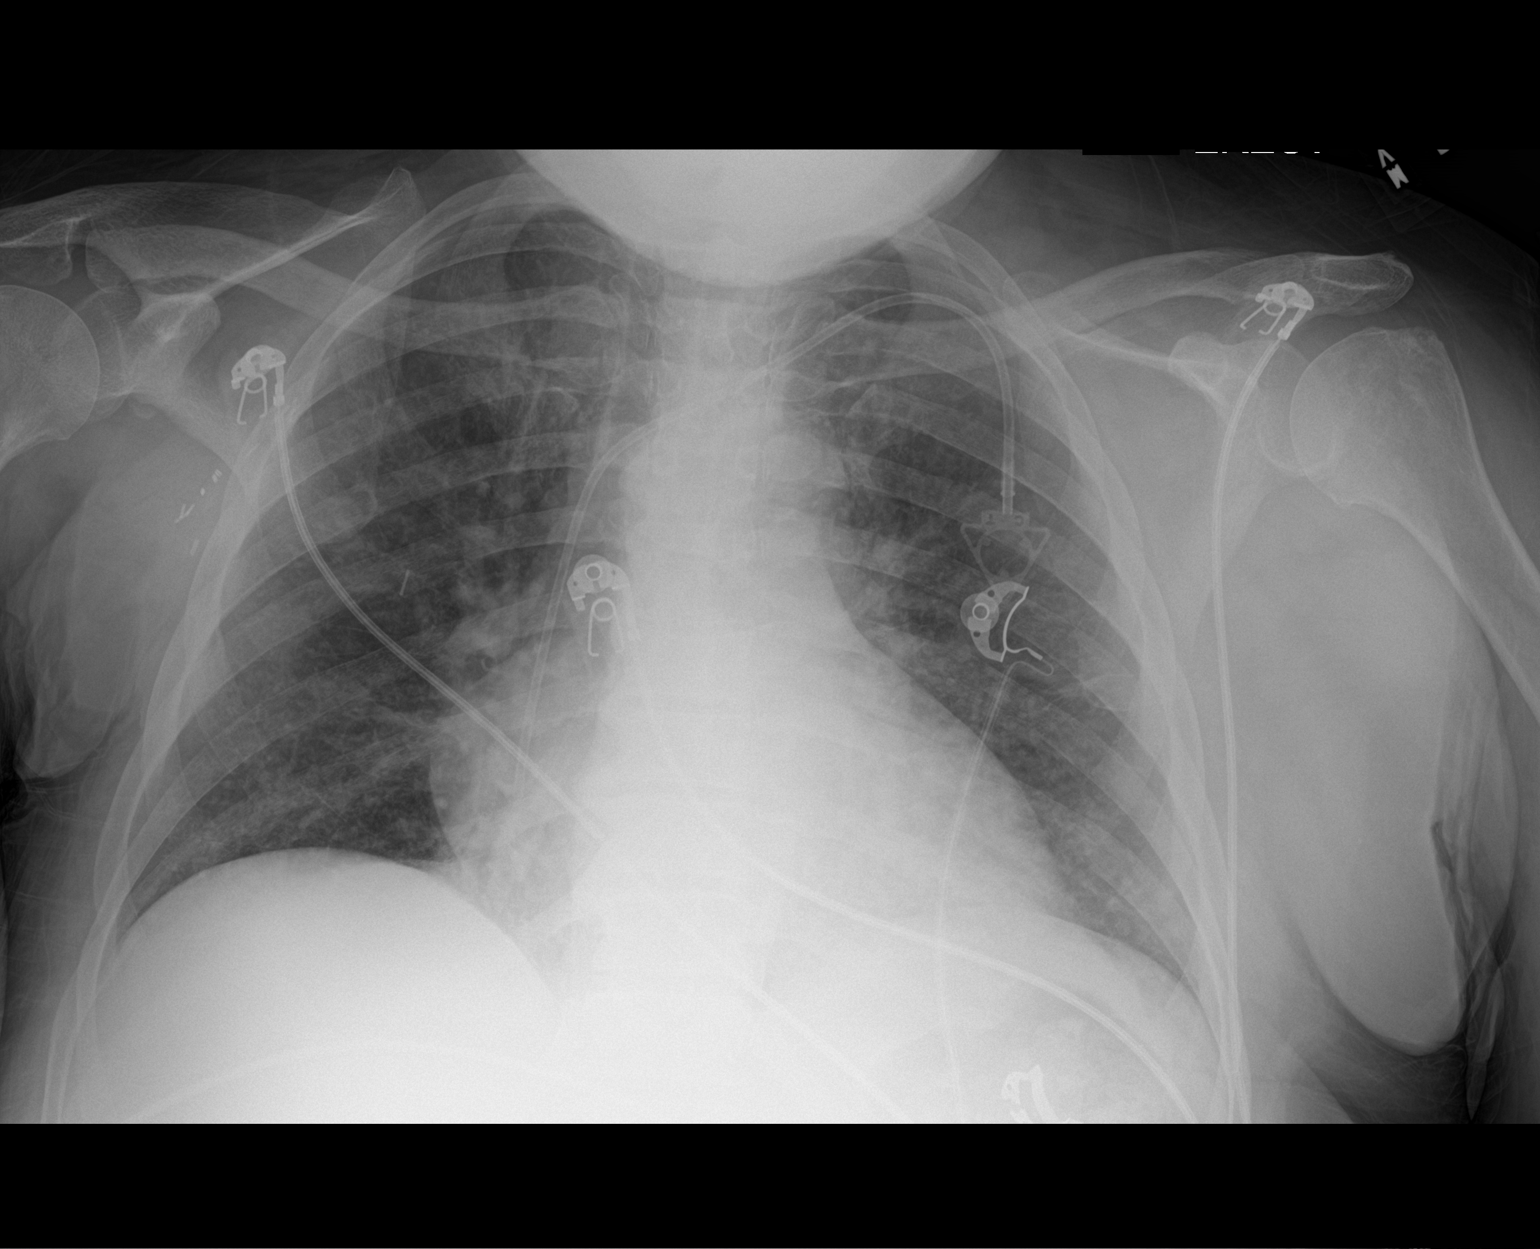

[1 of 1 positions shown; findings below may reference images not displayed]

FINDINGS: Left internal jugular single-lumen power porta cath with tip
terminating in the right atrium. Mild linear scarring in the left
mid lung, unchanged. Lung volumes are low. No consolidative airspace
disease. No pleural effusions. No pneumothorax. No pulmonary nodule
or mass noted. Pulmonary vasculature and the cardiomediastinal
silhouette are within normal limits. Aortic atherosclerosis.
Surgical clips in the region of the right axilla, presumably from
prior lymph node dissection.
IMPRESSION: 1. No radiographic evidence of acute cardiopulmonary disease.
2. Aortic atherosclerosis.

## 2020-04-03 IMAGING — DX DG ABDOMEN 1V
1 series · 1 of 1 positions shown · non-contrast
Comparison: CT 03/01/2010

CLINICAL DATA: Abdominal distention

EXAM:
ABDOMEN - 1 VIEW

[abdomen kub]
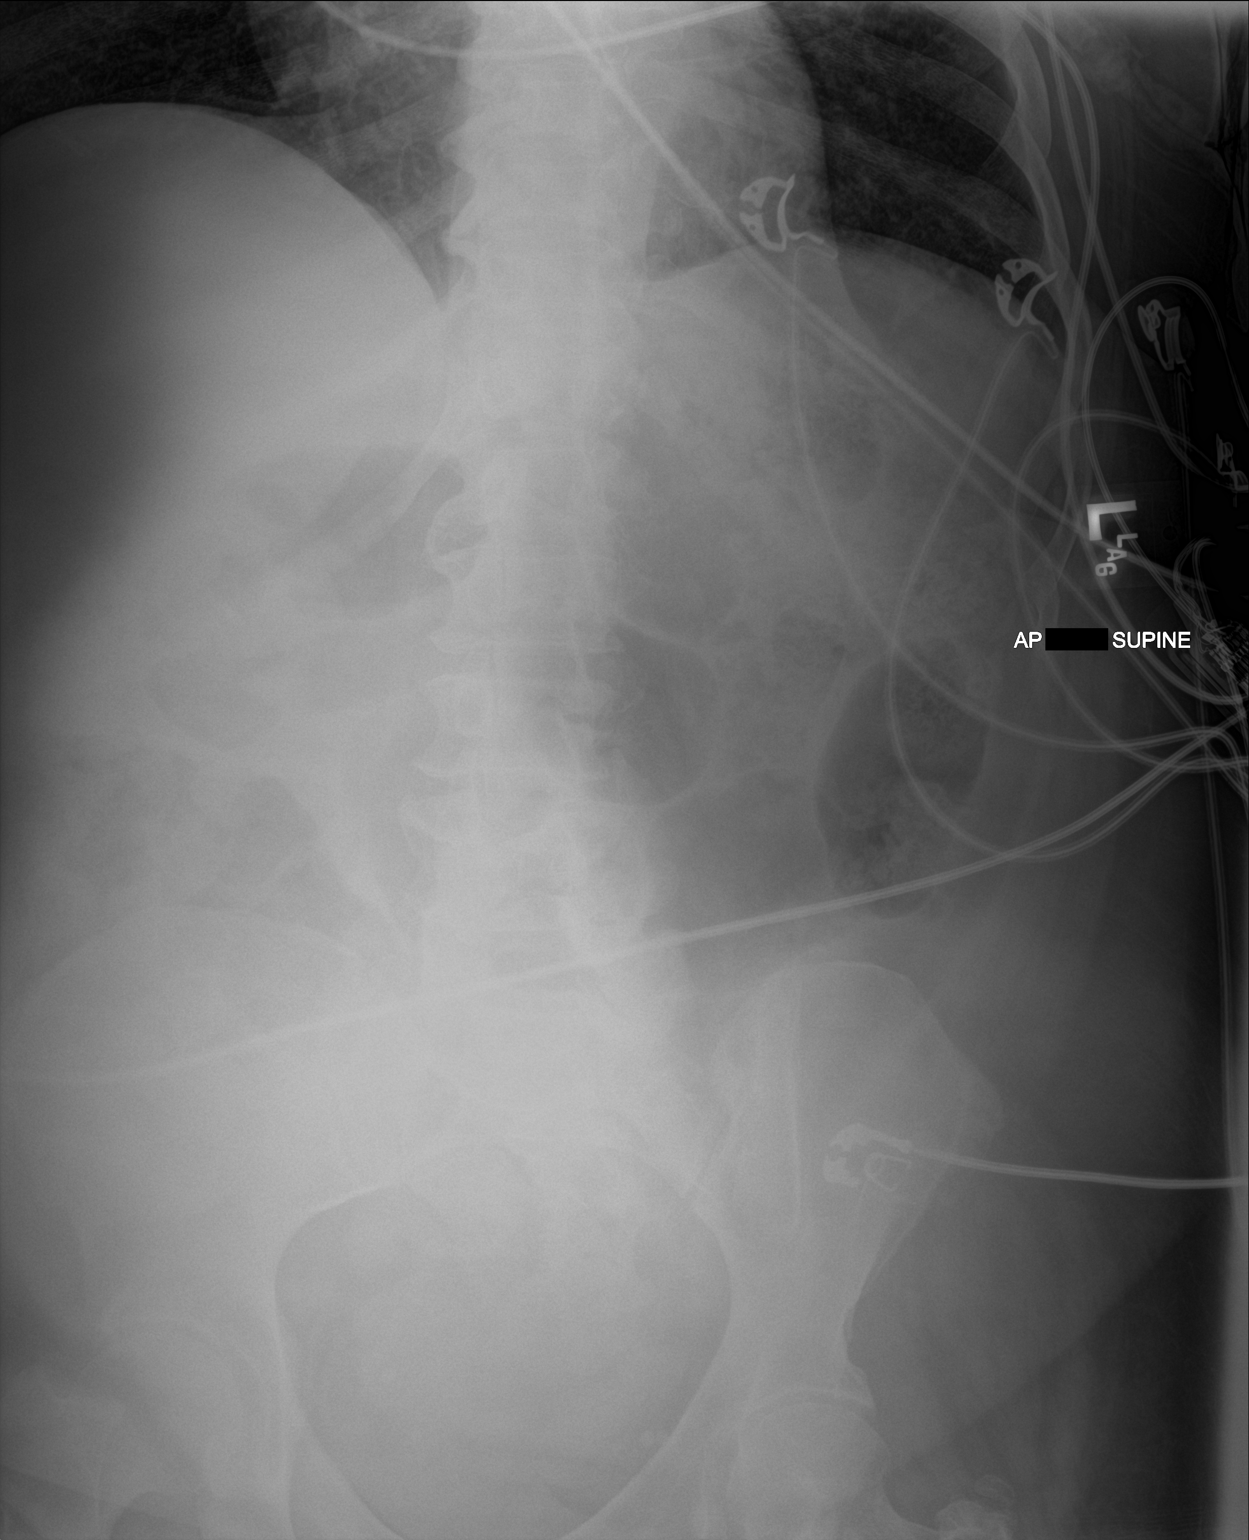

[1 of 1 positions shown; findings below may reference images not displayed]

FINDINGS: Nonobstructive bowel gas pattern. Gas and large stool burden within
the colon. No visible free air.
IMPRESSION: Large stool burden throughout the colon. No evidence of bowel
obstruction.

## 2020-04-07 ENCOUNTER — Other Ambulatory Visit: Payer: Self-pay | Admitting: Oncology

## 2020-04-09 ENCOUNTER — Encounter: Payer: Self-pay | Admitting: Licensed Clinical Social Worker

## 2020-04-09 NOTE — Progress Notes (Signed)
[place] CSW Progress Note  Clinical Education officer, museum received TC from pt's daughter inquiring about status of Personal Care Services (PCS) form. Form was given to RN to complete and fax which she did last week. CSW called [employer] to confirm if referral was received, however, it was not. CSW asked RN to re-fax today.  CSW called Ms. Sheppard Plumber and relayed the above.     Edwinna Areola Rhealyn Cullen , LCSW

## 2020-04-13 ENCOUNTER — Telehealth: Payer: Self-pay | Admitting: *Deleted

## 2020-04-13 ENCOUNTER — Other Ambulatory Visit: Payer: Self-pay | Admitting: Oncology

## 2020-04-13 NOTE — Telephone Encounter (Signed)
Per Dr.[name], called pt to make aware that her appt is not needed for 6/23 unless pt feels necessary. Pt ok not to see provider on 6/23. Pt verbalized understanding.

## 2020-04-14 ENCOUNTER — Encounter: Payer: Self-pay | Admitting: Licensed Clinical Social Worker

## 2020-04-14 NOTE — Progress Notes (Signed)
[place] CSW Progress Note  Clinical Social Archivist by phone to follow-up on [name]) referral. Referral was received and reassessment is scheduled for tomorrow, June 9.     [name] , LCSW

## 2020-04-15 ENCOUNTER — Telehealth: Payer: Self-pay | Admitting: *Deleted

## 2020-04-15 NOTE — Telephone Encounter (Signed)
Faxed completed form to [employer].

## 2020-04-16 ENCOUNTER — Ambulatory Visit (HOSPITAL_COMMUNITY)
Admission: RE | Admit: 2020-04-16 | Discharge: 2020-04-16 | Disposition: A | Discharge status: Home / Self Care | Payer: Medicare Other | Source: Ambulatory Visit | Attending: Radiation Oncology | Admitting: Radiation Oncology

## 2020-04-16 ENCOUNTER — Other Ambulatory Visit: Payer: Self-pay

## 2020-04-16 NOTE — Progress Notes (Signed)
Patient was scheduled for MRI Brain wo/w at 1pm 6/10, pt was a no show/no call  Called and spoke with patients daughter in law

## 2020-04-17 ENCOUNTER — Ambulatory Visit: Payer: Medicare Other | Admitting: Family Medicine

## 2020-04-20 ENCOUNTER — Telehealth: Payer: Self-pay | Admitting: Radiation Oncology

## 2020-04-20 ENCOUNTER — Telehealth: Payer: Self-pay | Admitting: *Deleted

## 2020-04-20 NOTE — Telephone Encounter (Signed)
This RN spoke with pt per her call- stating she is having " itching from the chemo pill ". This RN discussed that she has been on this pill for an extended period and stated concern for itching just now occurring - [name] states " I know but now I am itching "  Of note pt's history of medications use has not been consistent and recently her daughter has been administering medication for better compliance.  She states the "chemo pill " is also causing stomach pain.  Per above discussion- she will hold the [facility] for the next 3 days and call us with an update.  This RN also spoke with [name] per above with understanding of holding the medication and calling this RN later this week with update.

## 2020-04-22 ENCOUNTER — Telehealth: Payer: Self-pay

## 2020-04-22 NOTE — Telephone Encounter (Signed)
Called patient to reschedule 6/28 palliative in home visit.  Spoke with daughter and rescheduled for 7/22 at 1 pm

## 2020-04-25 ENCOUNTER — Other Ambulatory Visit: Payer: Self-pay | Admitting: Oncology

## 2020-04-29 ENCOUNTER — Ambulatory Visit: Payer: Medicare Other | Admitting: Oncology

## 2020-04-29 ENCOUNTER — Inpatient Hospital Stay: Payer: Medicare Other

## 2020-04-29 ENCOUNTER — Encounter: Payer: Self-pay | Admitting: *Deleted

## 2020-04-29 ENCOUNTER — Inpatient Hospital Stay: Payer: Medicare Other | Attending: Oncology

## 2020-04-29 ENCOUNTER — Other Ambulatory Visit: Payer: Self-pay

## 2020-04-29 VITALS — BP 129/93 | HR 69 | Temp 98.4°F | Resp 16

## 2020-04-29 DIAGNOSIS — Z923 Personal history of irradiation: Secondary | ICD-10-CM | POA: Diagnosis not present

## 2020-04-29 DIAGNOSIS — Z171 Estrogen receptor negative status [ER-]: Secondary | ICD-10-CM | POA: Diagnosis not present

## 2020-04-29 DIAGNOSIS — I890 Lymphedema, not elsewhere classified: Secondary | ICD-10-CM | POA: Insufficient documentation

## 2020-04-29 DIAGNOSIS — G893 Neoplasm related pain (acute) (chronic): Secondary | ICD-10-CM | POA: Insufficient documentation

## 2020-04-29 DIAGNOSIS — C4911 Malignant neoplasm of connective and soft tissue of right upper limb, including shoulder: Secondary | ICD-10-CM

## 2020-04-29 DIAGNOSIS — Z86718 Personal history of other venous thrombosis and embolism: Secondary | ICD-10-CM | POA: Diagnosis not present

## 2020-04-29 DIAGNOSIS — Z8616 Personal history of COVID-19: Secondary | ICD-10-CM | POA: Insufficient documentation

## 2020-04-29 DIAGNOSIS — Z9221 Personal history of antineoplastic chemotherapy: Secondary | ICD-10-CM | POA: Diagnosis not present

## 2020-04-29 DIAGNOSIS — C50211 Malignant neoplasm of upper-inner quadrant of right female breast: Secondary | ICD-10-CM | POA: Insufficient documentation

## 2020-04-29 DIAGNOSIS — Z170 Estrogen receptor positive status [ER+]: Secondary | ICD-10-CM

## 2020-04-29 DIAGNOSIS — Z5111 Encounter for antineoplastic chemotherapy: Secondary | ICD-10-CM | POA: Diagnosis present

## 2020-04-29 DIAGNOSIS — Z87891 Personal history of nicotine dependence: Secondary | ICD-10-CM | POA: Diagnosis not present

## 2020-04-29 DIAGNOSIS — R410 Disorientation, unspecified: Secondary | ICD-10-CM | POA: Diagnosis not present

## 2020-04-29 DIAGNOSIS — Z5112 Encounter for antineoplastic immunotherapy: Secondary | ICD-10-CM | POA: Insufficient documentation

## 2020-04-29 DIAGNOSIS — C7931 Secondary malignant neoplasm of brain: Secondary | ICD-10-CM | POA: Diagnosis not present

## 2020-04-29 DIAGNOSIS — R590 Localized enlarged lymph nodes: Secondary | ICD-10-CM | POA: Diagnosis not present

## 2020-04-29 DIAGNOSIS — C770 Secondary and unspecified malignant neoplasm of lymph nodes of head, face and neck: Secondary | ICD-10-CM | POA: Diagnosis not present

## 2020-04-29 DIAGNOSIS — Z9013 Acquired absence of bilateral breasts and nipples: Secondary | ICD-10-CM | POA: Insufficient documentation

## 2020-04-29 DIAGNOSIS — R5383 Other fatigue: Secondary | ICD-10-CM | POA: Diagnosis not present

## 2020-04-29 DIAGNOSIS — Z95828 Presence of other vascular implants and grafts: Secondary | ICD-10-CM

## 2020-04-29 LAB — CBC WITH DIFFERENTIAL (CANCER CENTER ONLY)
Abs Immature Granulocytes: 0.04 10*3/uL (ref 0.00–0.07)
Basophils Absolute: 0.1 10*3/uL (ref 0.0–0.1)
Basophils Relative: 1 %
Eosinophils Absolute: 0.3 10*3/uL (ref 0.0–0.5)
Eosinophils Relative: 4 %
HCT: 38.6 % (ref 36.0–46.0)
Hemoglobin: 12 g/dL (ref 12.0–15.0)
Immature Granulocytes: 1 %
Lymphocytes Relative: 13 %
Lymphs Abs: 1 10*3/uL (ref 0.7–4.0)
MCH: 26.7 pg (ref 26.0–34.0)
MCHC: 31.1 g/dL (ref 30.0–36.0)
MCV: 85.8 fL (ref 80.0–100.0)
Monocytes Absolute: 0.8 10*3/uL (ref 0.1–1.0)
Monocytes Relative: 10 %
Neutro Abs: 5.6 10*3/uL (ref 1.7–7.7)
Neutrophils Relative %: 71 %
Platelet Count: 272 10*3/uL (ref 150–400)
RBC: 4.5 MIL/uL (ref 3.87–5.11)
RDW: 15.6 % — ABNORMAL HIGH (ref 11.5–15.5)
WBC Count: 7.8 10*3/uL (ref 4.0–10.5)
nRBC: 0 % (ref 0.0–0.2)

## 2020-04-29 LAB — CMP (CANCER CENTER ONLY)
ALT: 10 U/L (ref 0–44)
AST: 14 U/L — ABNORMAL LOW (ref 15–41)
Albumin: 3.6 g/dL (ref 3.5–5.0)
Alkaline Phosphatase: 125 U/L (ref 38–126)
Anion gap: 10 (ref 5–15)
BUN: 15 mg/dL (ref 8–23)
CO2: 30 mmol/L (ref 22–32)
Calcium: 10.3 mg/dL (ref 8.9–10.3)
Chloride: 99 mmol/L (ref 98–111)
Creatinine: 1.07 mg/dL — ABNORMAL HIGH (ref 0.44–1.00)
GFR, Est AFR Am: >60 mL/min (ref >60)
GFR, Estimated: 55 mL/min — ABNORMAL LOW (ref >60)
Glucose, Bld: 97 mg/dL (ref 70–99)
Potassium: 3.2 mmol/L — ABNORMAL LOW (ref 3.5–5.1)
Sodium: 139 mmol/L (ref 135–145)
Total Bilirubin: 0.3 mg/dL (ref 0.3–1.2)
Total Protein: 7.5 g/dL (ref 6.5–8.1)

## 2020-04-29 MED ORDER — SODIUM CHLORIDE 0.9 % IV SOLN
420.0000 mg | Freq: Once | INTRAVENOUS | Status: AC
  Administered 2020-04-29: 420 mg via INTRAVENOUS
  Filled 2020-04-29: qty 14

## 2020-04-29 MED ORDER — ACETAMINOPHEN 325 MG PO TABS
650.0000 mg | ORAL_TABLET | Freq: Once | ORAL | Status: AC
  Administered 2020-04-29: 650 mg via ORAL

## 2020-04-29 MED ORDER — PROCHLORPERAZINE MALEATE 10 MG PO TABS
ORAL_TABLET | ORAL | Status: AC
  Filled 2020-04-29: qty 1

## 2020-04-29 MED ORDER — PROCHLORPERAZINE MALEATE 10 MG PO TABS
10.0000 mg | ORAL_TABLET | Freq: Once | ORAL | Status: AC
  Administered 2020-04-29: 10 mg via ORAL

## 2020-04-29 MED ORDER — PACLITAXEL PROTEIN-BOUND CHEMO INJECTION 100 MG
100.0000 mg/m2 | Freq: Once | INTRAVENOUS | Status: AC
  Administered 2020-04-29: 200 mg via INTRAVENOUS
  Filled 2020-04-29: qty 40

## 2020-04-29 MED ORDER — ALTEPLASE 2 MG IJ SOLR
2.0000 mg | Freq: Once | INTRAMUSCULAR | Status: AC | PRN
  Administered 2020-04-29: 2 mg
  Filled 2020-04-29: qty 2

## 2020-04-29 MED ORDER — HEPARIN SOD (PORK) LOCK FLUSH 100 UNIT/ML IV SOLN
500.0000 [IU] | Freq: Once | INTRAVENOUS | Status: AC | PRN
  Administered 2020-04-29: 500 [IU]
  Filled 2020-04-29: qty 5

## 2020-04-29 MED ORDER — SODIUM CHLORIDE 0.9 % IV SOLN
Freq: Once | INTRAVENOUS | Status: AC
  Filled 2020-04-29: qty 250

## 2020-04-29 MED ORDER — SODIUM CHLORIDE 0.9% FLUSH
10.0000 mL | INTRAVENOUS | Status: DC | PRN
  Administered 2020-04-29: 10 mL
  Filled 2020-04-29: qty 10

## 2020-04-29 MED ORDER — ACETAMINOPHEN 325 MG PO TABS
ORAL_TABLET | ORAL | Status: AC
  Filled 2020-04-29: qty 2

## 2020-04-29 MED ORDER — TRASTUZUMAB-ANNS CHEMO 150 MG IV SOLR
450.0000 mg | Freq: Once | INTRAVENOUS | Status: AC
  Administered 2020-04-29: 450 mg via INTRAVENOUS
  Filled 2020-04-29: qty 21.43

## 2020-04-29 MED ORDER — SODIUM CHLORIDE 0.9% FLUSH
10.0000 mL | Freq: Once | INTRAVENOUS | Status: AC
  Administered 2020-04-29: 10 mL
  Filled 2020-04-29: qty 10

## 2020-04-29 MED ORDER — DIPHENHYDRAMINE HCL 25 MG PO CAPS
ORAL_CAPSULE | ORAL | Status: AC
  Filled 2020-04-29: qty 1

## 2020-04-29 MED ORDER — DIPHENHYDRAMINE HCL 25 MG PO CAPS
25.0000 mg | ORAL_CAPSULE | Freq: Once | ORAL | Status: AC
  Administered 2020-04-29: 25 mg via ORAL

## 2020-04-29 MED ORDER — ALTEPLASE 2 MG IJ SOLR
INTRAMUSCULAR | Status: AC
  Filled 2020-04-29: qty 2

## 2020-04-29 NOTE — Patient Instructions (Signed)
[place] Discharge Instructions for Patients Receiving Chemotherapy  Today you received the following chemotherapy agents Trastuzumab, Perjeta and Abraxane  To help prevent nausea and vomiting after your treatment, we encourage you to take your nausea medication as directed.    If you develop nausea and vomiting that is not controlled by your nausea medication, call the clinic.   BELOW ARE SYMPTOMS THAT SHOULD BE REPORTED IMMEDIATELY:  *FEVER GREATER THAN 100.5 F  *CHILLS WITH OR WITHOUT FEVER  NAUSEA AND VOMITING THAT IS NOT CONTROLLED WITH YOUR NAUSEA MEDICATION  *UNUSUAL SHORTNESS OF BREATH  *UNUSUAL BRUISING OR BLEEDING  TENDERNESS IN MOUTH AND THROAT WITH OR WITHOUT PRESENCE OF ULCERS  *URINARY PROBLEMS  *BOWEL PROBLEMS  UNUSUAL RASH Items with * indicate a potential emergency and should be followed up as soon as possible.  Feel free to call the clinic should you have any questions or concerns. The clinic phone number is [phone].  Please show the [place] at check-in to the Emergency Department and triage nurse.

## 2020-04-29 NOTE — Progress Notes (Signed)
Pt's C/O recent onset of peripheral neuropathy in her feet (since radiation) reported to Dr. Lindi Adie, ok to proceed with today's tx.

## 2020-04-29 NOTE — Progress Notes (Signed)
Unable to get blood from port. Cathflo administered by Amy RN. Patient sent back to lab to get blood drawn from arm.

## 2020-04-30 ENCOUNTER — Ambulatory Visit (HOSPITAL_COMMUNITY): Admission: RE | Admit: 2020-04-30 | Payer: Medicare Other | Source: Ambulatory Visit

## 2020-05-02 ENCOUNTER — Ambulatory Visit (HOSPITAL_COMMUNITY)
Admission: EM | Admit: 2020-05-02 | Discharge: 2020-05-02 | Disposition: A | Discharge status: Home / Self Care | Payer: Medicare Other

## 2020-05-02 ENCOUNTER — Encounter (HOSPITAL_COMMUNITY): Payer: Self-pay

## 2020-05-02 NOTE — Discharge Instructions (Addendum)
Left without paperwork

## 2020-05-02 NOTE — ED Provider Notes (Signed)
[place]    CSN: [id number] Arrival date & time: 05/02/20  1309      History   Chief Complaint Chief Complaint  Patient presents with  . Back Pain    HPI [name] is a 64 y.o. female.   Presents for "only pain medication". Her husband is present and is not familiar with her overall care. Patient appears confused as noted by triage. She does not want anything but pain management, says her "whole body hurts".      Past Medical History:  Diagnosis Date  . Breast cancer ([place])   . DVT (deep venous thrombosis) ([place]) 10/07/2011  . Fever 02/06/2018  . Hypertension   . Radiation 11/29/2005-12/29/2005   right supraclavicular area 4147 cGy  . Radiation 06/26/02-08/14/02   right breast 5040 cGy, tumor bed boosted to 1260 cGy  . Rheumatic fever     Patient Active Problem List   Diagnosis Date Noted  . Cancer of right breast metastatic to brain ([place]) 03/16/2020  . Nausea & vomiting 02/29/2020  . UTI (urinary tract infection) 02/11/2020  . Acute metabolic encephalopathy 25/42/7062  . Generalized weakness 02/11/2020  . Hypokalemia 02/11/2020  . Paresis of left lower extremity ([place]) 02/11/2020  . Paresis of right lower extremity ([place]) 02/11/2020  . Drug-induced polyneuropathy ([place]) 12/19/2019  . Acute respiratory failure with hypoxia ([place]) 11/19/2019  . COVID-19 virus infection 11/19/2019  . Overdose opiate, accidental or unintentional, initial encounter ([place]) 11/19/2019  . Acute encephalopathy 11/19/2019  . Metastasis to brain ([place]) 09/13/2019  . Acute lower UTI 02/06/2018  . Altered mental status 02/06/2018  . Bacteremia 02/06/2018  . Polypharmacy 02/06/2018  . Goals of care, counseling/discussion 09/05/2017  . Pneumonia 01/25/2017  . CAP (community acquired pneumonia) 01/23/2017  . Port catheter in place 05/30/2016  . 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 Chronic pain 04/13/2015  . Left arm pain 08/18/2014  . Malignant  neoplasm of upper-inner quadrant of right breast in female, estrogen receptor positive ([place]) 10/03/2013  . Post-lymphadenectomy lymphedema of arm 08/15/2013  . Port or reservoir infection 01/29/2013  . DVT (deep venous thrombosis) ([place]) 10/07/2011  . Chest pain 09/10/2009  . Secondary cardiomyopathy ([place]) 09/02/2009  . Essential hypertension 02/26/2009  . GERD 02/26/2009    Past Surgical History:  Procedure Laterality Date  . BREAST SURGERY    . MASTECTOMY Bilateral   . PORT A CATH REVISION      OB History   No obstetric history on file.      Home Medications    Prior to Admission medications   Medication Sig Start Date End Date Taking? Authorizing Provider  acetaminophen (TYLENOL) 325 MG tablet Take 2 tablets (650 mg total) by mouth every 6 (six) hours as needed for mild pain (or Fever >/= 101). 03/24/20   Georgette Shell, MD  allopurinol (ZYLOPRIM) 300 MG tablet Take 300 mg by mouth daily. 01/20/20   [provider]  ALPRAZolam Duanne Moron) 1 MG tablet Take 1 tablet (1 mg total) by mouth at bedtime as needed for sleep. 01/13/20   Truitt Merle, MD  Ascorbic Acid (VITAMIN C PO) Take 1 tablet by mouth daily.    [provider]  cholecalciferol (VITAMIN D3) 25 MCG (1000 UNIT) tablet Take 1,000 Units by mouth daily.    [provider]  dexamethasone (DECADRON) 2 MG tablet Take Decadron 2 mg daily for 7 days and then stop 03/24/20   Georgette Shell, MD  gabapentin (NEURONTIN) 100  MG capsule Take 1 capsule (300 mg total) by mouth 2 (two) times daily as needed. Patient taking differently: Take 100 mg by mouth 2 (two) times daily.  02/18/20   [name], [name], MD  iron polysaccharides (NIFEREX) 150 MG capsule Take 1 capsule (150 mg total) by mouth daily. 03/02/20   Raiford Noble Latif, DO  levETIRAcetam (KEPPRA) 500 MG tablet Take 1 tablet (500 mg total) by mouth 2 (two) times daily. 03/24/20   Georgette Shell, MD  MAGNESIUM PO Take 1 tablet by mouth daily.     [provider]  Multiple Vitamin (MULTIVITAMIN) capsule Take 1 capsule by mouth daily.      [provider]  mupirocin ointment (BACTROBAN) 2 % Place 1 application into the nose 2 (two) times daily. 03/24/20   Georgette Shell, MD  omeprazole (PRILOSEC) 40 MG capsule Take 1 capsule (40 mg total) by mouth daily. Patient taking differently: Take 40 mg by mouth daily as needed (heartburn).  02/04/20   [name], [name], MD  ondansetron (ZOFRAN) 4 MG tablet Take 1 tablet (4 mg total) by mouth 3 (three) times daily as needed. 03/02/20   Sheikh, Omair Latif, DO  ondansetron (ZOFRAN) 4 MG tablet Take 1 tablet (4 mg total) by mouth every 6 (six) hours as needed for nausea. 03/24/20   Georgette Shell, MD  Potassium Chloride ER 20 MEQ TBCR Take 1 tablet by mouth daily. 03/16/20   [provider]  prochlorperazine (COMPAZINE) 10 MG tablet Take 1 tablet (10 mg total) by mouth every 6 (six) hours as needed for nausea or vomiting. 04/07/20   [name], [name], MD  sertraline (ZOLOFT) 100 MG tablet Take 1 tablet (100 mg total) by mouth daily. 03/16/20   Libby Maw, MD  torsemide (DEMADEX) 20 MG tablet Take 10 mg by mouth daily. 03/02/20   [provider]  triamterene-hydrochlorothiazide (DYAZIDE) 37.5-25 MG capsule Take 1 capsule by mouth daily. 03/16/20   [provider]  tucatinib (TUKYSA) 150 MG tablet Take 150 mg by mouth every morning.  12/18/19   [name], [name], MD    Family History Family History  Problem Relation Age of Onset  . Pancreatic cancer Mother   . Kidney cancer Sister 23  . Breast cancer Sister 20  . Breast cancer Other 77    Social History Social History   Tobacco Use  . Smoking status: Former Research scientist (life sciences)  . Smokeless tobacco: Never Used  Vaping Use  . Vaping Use: Never used  Substance Use Topics  . Alcohol use: Never    Comment: Rarely  . Drug use: No     Allergies   Tramadol, Hydrocodone-acetaminophen, and  Pork-derived products   Review of Systems Review of Systems   Physical Exam Triage Vital Signs ED Triage Vitals  Enc Vitals Group     BP 05/02/20 1334 134/75     Pulse Rate 05/02/20 1334 78     Resp 05/02/20 1334 18     Temp 05/02/20 1334 98.2 F (36.8 C)     Temp Source 05/02/20 1334 Oral     SpO2 05/02/20 1334 100 %     Weight --      Height --      Head Circumference --      Peak Flow --      Pain Score 05/02/20 1337 10     Pain Loc --      Pain Edu? --      Excl. in  GC? --    No data found.  Updated Vital Signs BP 134/75 (BP Location: Right Arm)   Pulse 78   Temp 98.2 F (36.8 C) (Oral)   Resp 18   SpO2 100%   Visual Acuity Right Eye Distance:   Left Eye Distance:   Bilateral Distance:    Right Eye Near:   Left Eye Near:    Bilateral Near:     Physical Exam Vitals and nursing note reviewed.  Constitutional:      Appearance: Normal appearance.  Neurological:     Mental Status: She is alert.      UC Treatments / Results  Labs (all labs ordered are listed, but only abnormal results are displayed) Labs Reviewed - No data to display  EKG   Radiology No results found.  Procedures Procedures (including critical care time)  Medications Ordered in UC Medications - No data to display  Initial Impression / Assessment and Plan / UC Course  I have reviewed the triage vital signs and the nursing notes.  Pertinent labs & imaging results that were available during my care of the patient were reviewed by me and considered in my medical decision making (see chart for details).     Did not exam patient due to the fact she refused and only wanted pain medication. Per past records she was given 150 oxycodone on 5/23. There were also reports of worsening confusion with narcotics and now she is being followed by palliative care. I have provided this number to her husband to call. They left without discharge or paperwork.  Final Clinical Impressions(s) /  UC Diagnoses   Final diagnoses:  None   Discharge Instructions   None    ED Prescriptions    None     PDMP not reviewed this encounter.   Bjorn Pippin, PA-C 05/02/20 1409

## 2020-05-02 NOTE — ED Triage Notes (Signed)
Pt presents today with pain in back. Pt states she has tumors in her back and is actively undergoing chemo. Last treatment 6/23 per patient. Pt denies any pain regimine from onc. Pt denies OTC treatment before arrival. Pt confused, poor historian. Pt refusing for husband to come to treatment room for assistance in triage.

## 2020-05-04 ENCOUNTER — Telehealth: Payer: Self-pay

## 2020-05-04 ENCOUNTER — Other Ambulatory Visit: Payer: Self-pay

## 2020-05-04 ENCOUNTER — Other Ambulatory Visit: Payer: Medicare Other | Admitting: Hospice

## 2020-05-04 ENCOUNTER — Inpatient Hospital Stay: Payer: Medicare Other

## 2020-05-04 NOTE — Telephone Encounter (Signed)
Phone call placed to patient's husband. Received message that caller was not available.

## 2020-05-04 NOTE — Telephone Encounter (Signed)
Phone call placed to patient to reach patient's husband. Patient stated that husband not there but she does not need anything.

## 2020-05-05 ENCOUNTER — Telehealth: Payer: Self-pay | Admitting: Family Medicine

## 2020-05-05 ENCOUNTER — Encounter: Payer: Self-pay | Admitting: Family Medicine

## 2020-05-05 NOTE — Telephone Encounter (Signed)
Patient called stating that her pain doctor is going to start prescribing her pain medication. She needs a letter faxed to Dr. Mohammed Kindle (ph: [phone] fax: [phone]) stating that Dr. Ethelene Hal will no longer be prescribing her Xanax. She needs the letter sent before 5pm today (if possible). Please call her back at [phone] if you have any questions.  Thank you, [name]

## 2020-05-05 NOTE — Telephone Encounter (Signed)
This RN received 2 VM's from the patient stating " the paper wasn't filled out right- you didn't put the number for Dr Alfonso Ramus on it and that he wouldn't prescribed xanax "  Second message is " I need a letter from Dr Alfonso Ramus ".  Note this RN completed form and a letter from this office stating goal is for the patient to obtain pain medications from one MD- which presently is Dr Mirna Mires.  Per 2 attempts to return call- number states " unable to complete call at this time - please try again later"

## 2020-05-05 NOTE — Telephone Encounter (Signed)
Done, tried calling patient to inform no answer unable to leave message. Sent message via [place]

## 2020-05-06 ENCOUNTER — Other Ambulatory Visit: Payer: Self-pay | Admitting: Oncology

## 2020-05-06 NOTE — Telephone Encounter (Signed)
Returned patients call, no answer unable to leave message will call back.  °

## 2020-05-06 NOTE — Telephone Encounter (Signed)
Patient called back and stated that Dr. Primus Bravo haven't received letter, please advise. CB is [phone]

## 2020-05-13 NOTE — Telephone Encounter (Signed)
No entry 

## 2020-05-14 ENCOUNTER — Other Ambulatory Visit: Payer: Self-pay | Admitting: Oncology

## 2020-05-14 ENCOUNTER — Telehealth: Payer: Self-pay | Admitting: *Deleted

## 2020-05-14 MED ORDER — METHADONE HCL 5 MG PO TABS: 6.0000 mg | ORAL_TABLET | Freq: Three times a day (TID) | ORAL | 0 refills | Status: DC

## 2020-05-14 NOTE — Telephone Encounter (Signed)
This RN spoke with pt today per her call stating need for pain medication.  Per phone discussion- she states Dr Mirna Mires " closed his pain practice ".  She was in the process of scheduling with Dr Mohammed Kindle for pain management " but he wanted me to see 3 different doctors for pain medications- him, a psychiatrist and my primary MD "  Post discussion- this RN verified Dr Mirna Mires had discontinued pain management practice.  This RN noted visit to Urgent Care on 6/26 which post discussion with MD and PMP review pt received 8 oxycodone and was requested to follow up with Palliative Care.  Per review of above- [name] is presently comfortable with prescribing methadone 5 mg for use TID until her visit on 05/27/2020.  Prescription sent to [facility] per pharmacy on medication list.  This RN attempted to reach pt with automated statement that the patient's phone was not working at this time.  This note will be sent to [facility] for review of above and possible further contact regarding need for pain control with methadone, tylenol and aleve per [name] at this time.

## 2020-05-15 ENCOUNTER — Telehealth: Payer: Self-pay

## 2020-05-15 NOTE — Telephone Encounter (Signed)
Received update from [facility] @ Dr. [name] office regarding patient's pain medications. This was relayed to Palliative Care NP, [place].

## 2020-05-18 ENCOUNTER — Other Ambulatory Visit: Payer: Self-pay

## 2020-05-18 ENCOUNTER — Emergency Department (HOSPITAL_COMMUNITY)
Admission: EM | Admit: 2020-05-18 | Discharge: 2020-05-18 | Discharge status: Against Medical Advice | Payer: Medicare Other

## 2020-05-18 NOTE — Telephone Encounter (Signed)
Error

## 2020-05-23 ENCOUNTER — Other Ambulatory Visit: Payer: Self-pay | Admitting: Oncology

## 2020-05-23 ENCOUNTER — Encounter: Payer: Self-pay | Admitting: Oncology

## 2020-05-25 ENCOUNTER — Other Ambulatory Visit: Payer: Self-pay | Admitting: Oncology

## 2020-05-25 DIAGNOSIS — Z170 Estrogen receptor positive status [ER+]: Secondary | ICD-10-CM

## 2020-05-25 DIAGNOSIS — C50211 Malignant neoplasm of upper-inner quadrant of right female breast: Secondary | ICD-10-CM

## 2020-05-27 ENCOUNTER — Ambulatory Visit: Payer: Medicare Other | Admitting: Oncology

## 2020-05-27 ENCOUNTER — Inpatient Hospital Stay: Payer: Medicare Other

## 2020-05-27 ENCOUNTER — Other Ambulatory Visit: Payer: Self-pay

## 2020-05-27 ENCOUNTER — Inpatient Hospital Stay (HOSPITAL_BASED_OUTPATIENT_CLINIC_OR_DEPARTMENT_OTHER): Payer: Medicare Other | Admitting: Oncology

## 2020-05-27 ENCOUNTER — Inpatient Hospital Stay: Payer: Medicare Other | Attending: Oncology

## 2020-05-27 ENCOUNTER — Other Ambulatory Visit: Payer: Medicare Other

## 2020-05-27 ENCOUNTER — Telehealth: Payer: Self-pay | Admitting: *Deleted

## 2020-05-27 ENCOUNTER — Ambulatory Visit: Payer: Medicare Other

## 2020-05-27 VITALS — BP 103/90 | HR 64 | Temp 98.5°F | Resp 18 | Ht 65.0 in | Wt 183.0 lb

## 2020-05-27 DIAGNOSIS — Z5112 Encounter for antineoplastic immunotherapy: Secondary | ICD-10-CM | POA: Diagnosis not present

## 2020-05-27 DIAGNOSIS — Z86718 Personal history of other venous thrombosis and embolism: Secondary | ICD-10-CM | POA: Diagnosis not present

## 2020-05-27 DIAGNOSIS — Z9013 Acquired absence of bilateral breasts and nipples: Secondary | ICD-10-CM | POA: Diagnosis not present

## 2020-05-27 DIAGNOSIS — Z803 Family history of malignant neoplasm of breast: Secondary | ICD-10-CM | POA: Diagnosis not present

## 2020-05-27 DIAGNOSIS — C770 Secondary and unspecified malignant neoplasm of lymph nodes of head, face and neck: Secondary | ICD-10-CM | POA: Insufficient documentation

## 2020-05-27 DIAGNOSIS — R400 Somnolence: Secondary | ICD-10-CM | POA: Diagnosis not present

## 2020-05-27 DIAGNOSIS — Z5111 Encounter for antineoplastic chemotherapy: Secondary | ICD-10-CM | POA: Insufficient documentation

## 2020-05-27 DIAGNOSIS — C4911 Malignant neoplasm of connective and soft tissue of right upper limb, including shoulder: Secondary | ICD-10-CM | POA: Diagnosis not present

## 2020-05-27 DIAGNOSIS — Z8616 Personal history of COVID-19: Secondary | ICD-10-CM | POA: Diagnosis not present

## 2020-05-27 DIAGNOSIS — C50211 Malignant neoplasm of upper-inner quadrant of right female breast: Secondary | ICD-10-CM | POA: Diagnosis present

## 2020-05-27 DIAGNOSIS — Z95828 Presence of other vascular implants and grafts: Secondary | ICD-10-CM

## 2020-05-27 DIAGNOSIS — Z171 Estrogen receptor negative status [ER-]: Secondary | ICD-10-CM | POA: Insufficient documentation

## 2020-05-27 DIAGNOSIS — I890 Lymphedema, not elsewhere classified: Secondary | ICD-10-CM | POA: Diagnosis not present

## 2020-05-27 DIAGNOSIS — Z170 Estrogen receptor positive status [ER+]: Secondary | ICD-10-CM

## 2020-05-27 DIAGNOSIS — Z8051 Family history of malignant neoplasm of kidney: Secondary | ICD-10-CM | POA: Diagnosis not present

## 2020-05-27 DIAGNOSIS — C7931 Secondary malignant neoplasm of brain: Secondary | ICD-10-CM | POA: Diagnosis not present

## 2020-05-27 DIAGNOSIS — C50911 Malignant neoplasm of unspecified site of right female breast: Secondary | ICD-10-CM | POA: Diagnosis not present

## 2020-05-27 DIAGNOSIS — Z87891 Personal history of nicotine dependence: Secondary | ICD-10-CM | POA: Diagnosis not present

## 2020-05-27 DIAGNOSIS — R590 Localized enlarged lymph nodes: Secondary | ICD-10-CM | POA: Diagnosis not present

## 2020-05-27 DIAGNOSIS — Z923 Personal history of irradiation: Secondary | ICD-10-CM | POA: Insufficient documentation

## 2020-05-27 DIAGNOSIS — G8929 Other chronic pain: Secondary | ICD-10-CM | POA: Insufficient documentation

## 2020-05-27 DIAGNOSIS — Z9221 Personal history of antineoplastic chemotherapy: Secondary | ICD-10-CM | POA: Diagnosis not present

## 2020-05-27 LAB — CMP (CANCER CENTER ONLY)
ALT: 7 U/L (ref 0–44)
AST: 15 U/L (ref 15–41)
Albumin: 3.3 g/dL — ABNORMAL LOW (ref 3.5–5.0)
Alkaline Phosphatase: 116 U/L (ref 38–126)
Anion gap: 11 (ref 5–15)
BUN: 12 mg/dL (ref 8–23)
CO2: 29 mmol/L (ref 22–32)
Calcium: 10.4 mg/dL — ABNORMAL HIGH (ref 8.9–10.3)
Chloride: 101 mmol/L (ref 98–111)
Creatinine: 1.1 mg/dL — ABNORMAL HIGH (ref 0.44–1.00)
GFR, Est AFR Am: >60 mL/min (ref >60)
GFR, Estimated: 53 mL/min — ABNORMAL LOW (ref >60)
Glucose, Bld: 97 mg/dL (ref 70–99)
Potassium: 3.9 mmol/L (ref 3.5–5.1)
Sodium: 141 mmol/L (ref 135–145)
Total Bilirubin: 0.2 mg/dL — ABNORMAL LOW (ref 0.3–1.2)
Total Protein: 7.1 g/dL (ref 6.5–8.1)

## 2020-05-27 LAB — CBC WITH DIFFERENTIAL (CANCER CENTER ONLY)
Abs Immature Granulocytes: 0.03 10*3/uL (ref 0.00–0.07)
Basophils Absolute: 0.1 10*3/uL (ref 0.0–0.1)
Basophils Relative: 1 %
Eosinophils Absolute: 0.3 10*3/uL (ref 0.0–0.5)
Eosinophils Relative: 4 %
HCT: 33.7 % — ABNORMAL LOW (ref 36.0–46.0)
Hemoglobin: 10.4 g/dL — ABNORMAL LOW (ref 12.0–15.0)
Immature Granulocytes: 0 %
Lymphocytes Relative: 12 %
Lymphs Abs: 0.9 10*3/uL (ref 0.7–4.0)
MCH: 26.2 pg (ref 26.0–34.0)
MCHC: 30.9 g/dL (ref 30.0–36.0)
MCV: 84.9 fL (ref 80.0–100.0)
Monocytes Absolute: 0.8 10*3/uL (ref 0.1–1.0)
Monocytes Relative: 10 %
Neutro Abs: 5.6 10*3/uL (ref 1.7–7.7)
Neutrophils Relative %: 73 %
Platelet Count: 308 10*3/uL (ref 150–400)
RBC: 3.97 MIL/uL (ref 3.87–5.11)
RDW: 16.6 % — ABNORMAL HIGH (ref 11.5–15.5)
WBC Count: 7.7 10*3/uL (ref 4.0–10.5)
nRBC: 0 % (ref 0.0–0.2)

## 2020-05-27 IMAGING — CT CT HEAD W/O CM
3 series · 15 of 47 positions shown, 18 images · non-contrast
Comparison: CT 11/19/2019, MR 09/04/2019

CLINICAL DATA: Headache, intracranial hemorrhage suspected

EXAM:
CT HEAD WITHOUT CONTRAST
TECHNIQUE: Contiguous axial images were obtained from the base of the skull
through the vertex without intravenous contrast.

[Series 2: head wo · axial · 0.44mm/px · z∈[-141,-11]mm · 9 of 32 slices shown, 12 images]
[im 3/32  brain]
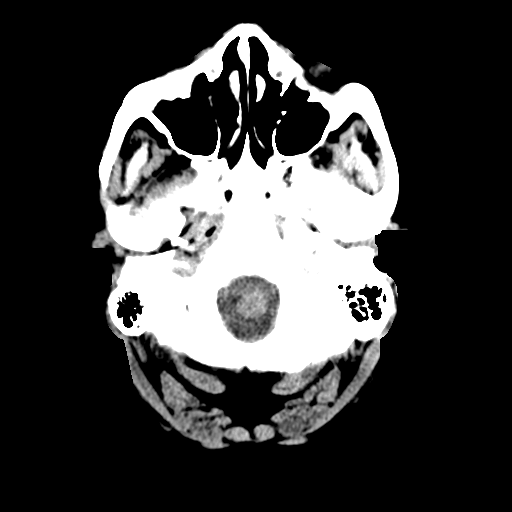
[im 3/32  bone]
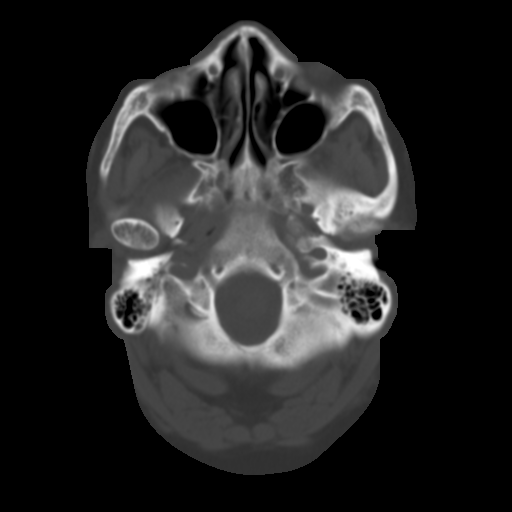
[im 6/32  brain]
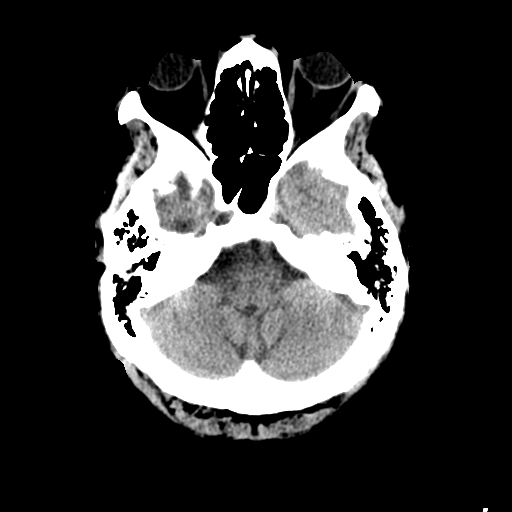
[im 9/32  brain]
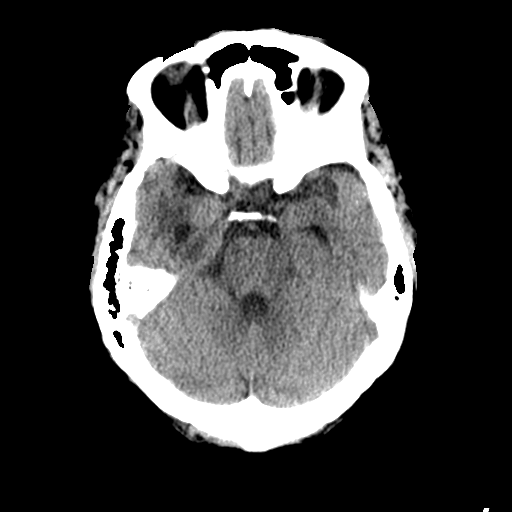
[im 12/32  brain]
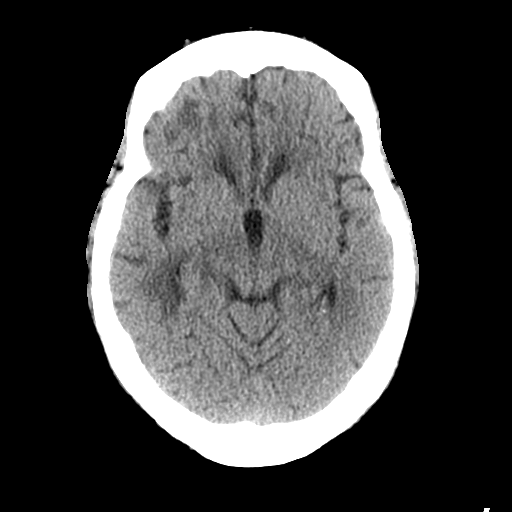
[im 17/32  brain]
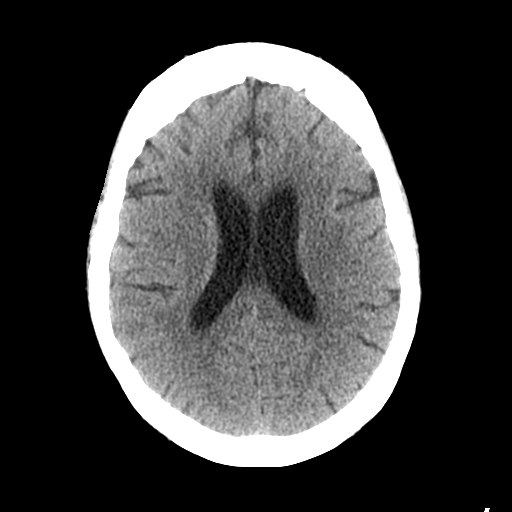
[im 17/32  bone]
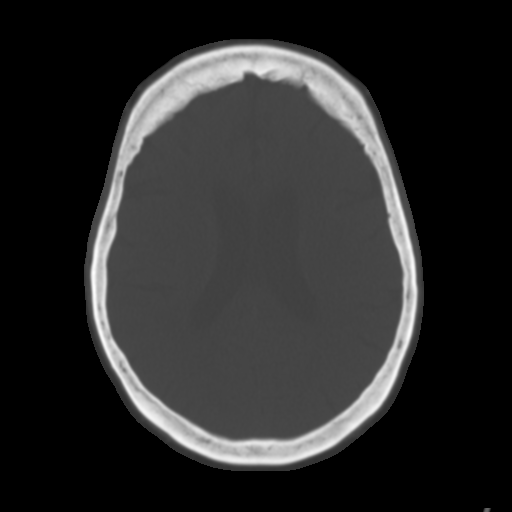
[im 20/32  brain]
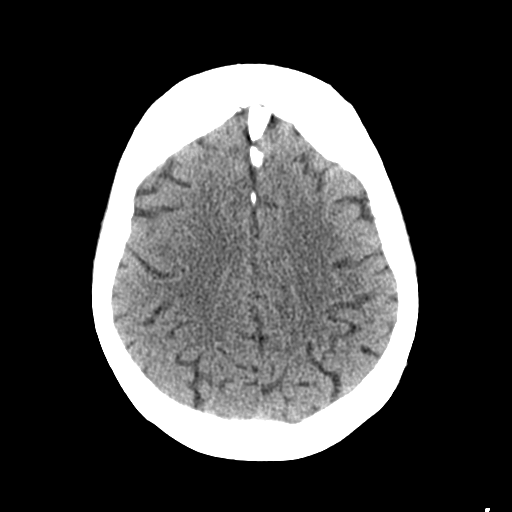
[im 23/32  brain]
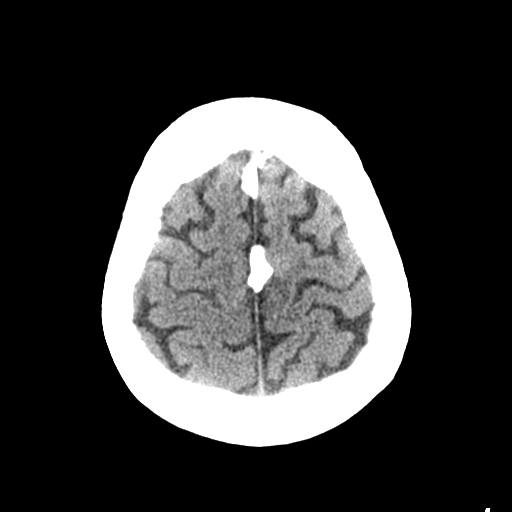
[im 26/32  brain]
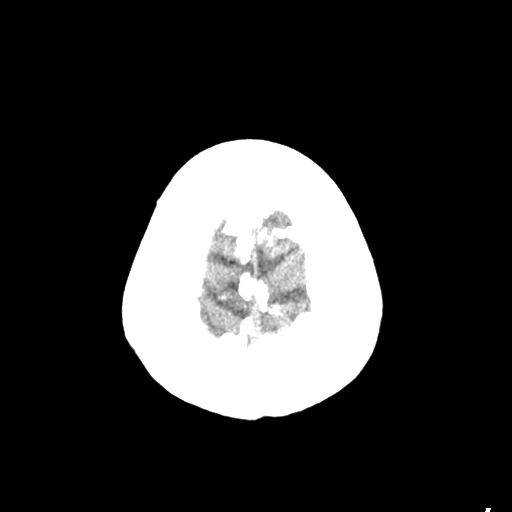
[im 29/32  brain]
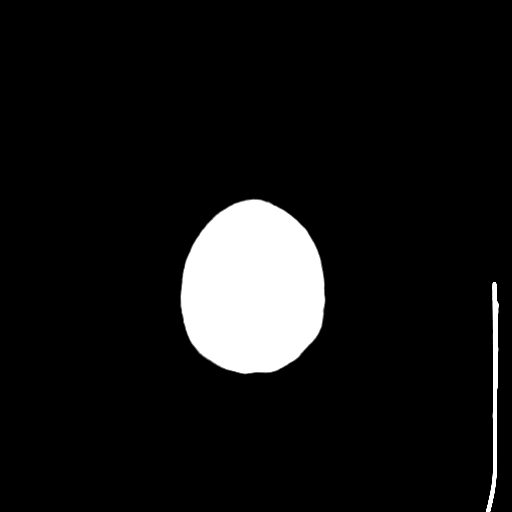
[im 29/32  bone]
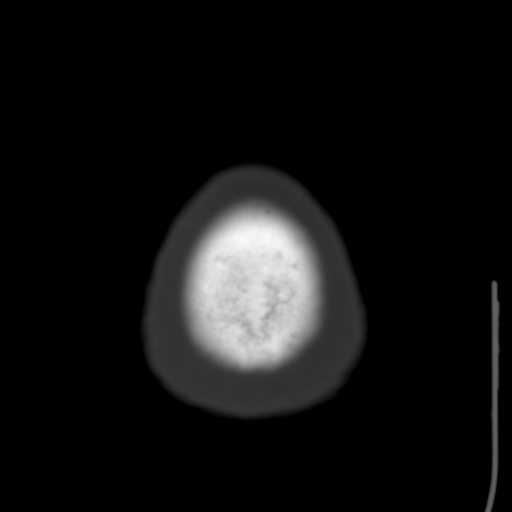

[Series 5: coronal soft tissue · coronal · 0.33mm/px · 3 of 73 slices shown]
[im 25/73  brain]
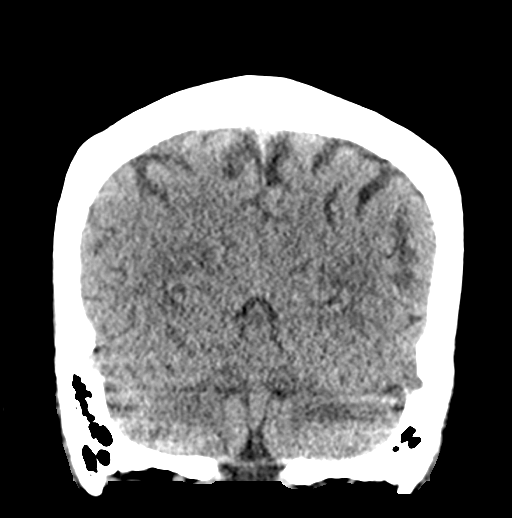
[im 33/73  brain]
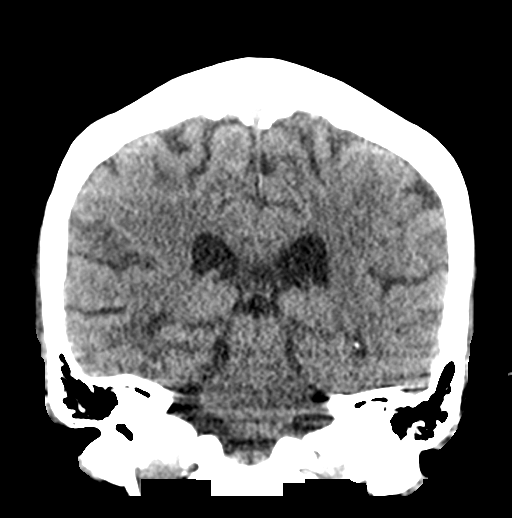
[im 41/73  brain]
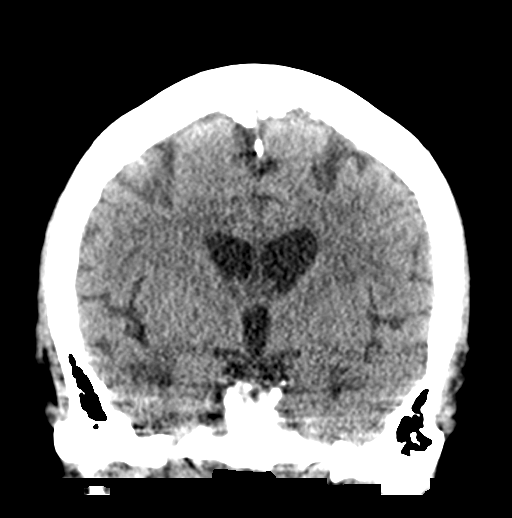

[Series 6: sagittal soft tissue · sagittal · 0.33mm/px · 3 of 55 slices shown]
[im 19/55  brain]
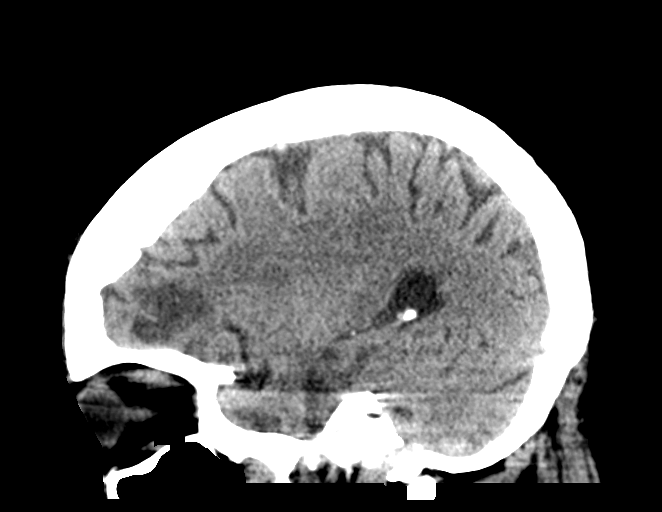
[im 28/55  brain]
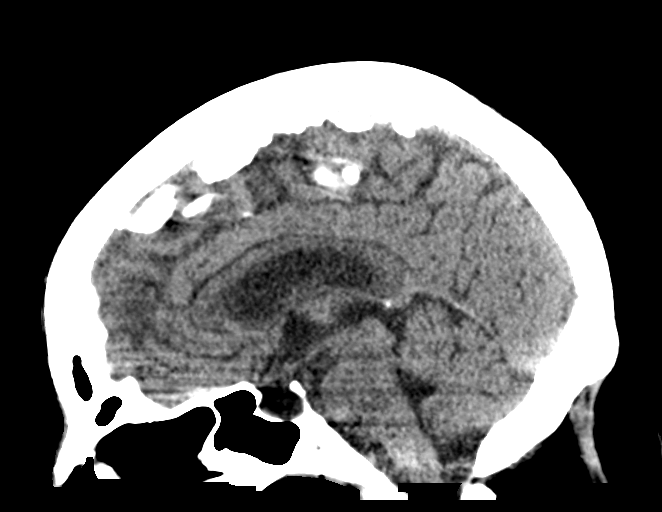
[im 37/55  brain]
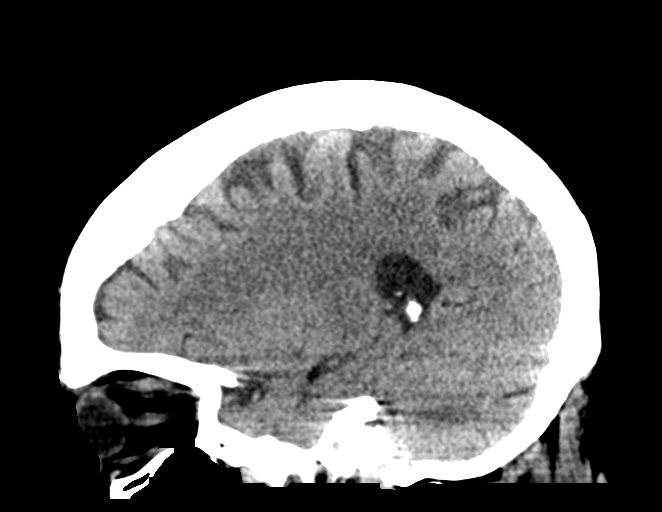

[15 of 47 positions shown; findings below may reference images not displayed]

FINDINGS: Brain: Redemonstration ill-defined lesion with surrounding vasogenic
edema in the right frontal lobe, the overall extent of which appears
diminished from prior exam. Additional hypoattenuation in the right
temporal pole compatible with a second known lesion as well. No new
areas of edema. No evidence of acute infarction, hemorrhage,
hydrocephalus, extra-axial collection or mass lesion/mass effect.

Vascular: No hyperdense vessel or unexpected calcification.

Skull: No calvarial fracture or suspicious osseous lesion. No scalp
swelling or hematoma. Mild hyperostosis frontalis interna, a benign
incidental finding

Sinuses/Orbits: Paranasal sinuses and mastoid air cells are
predominantly clear. Included orbital structures are unremarkable.

Other: None
IMPRESSION: 1. Ill-defined lesions in the right frontal and temporal lobes with
surrounding vasogenic edema, compatible with known metastatic
disease. Overall the extent of edema is diminished from prior exam.
No new areas concerning for additional metastatic disease.
2. No other acute intracranial abnormality.

## 2020-05-27 MED ORDER — SODIUM CHLORIDE 0.9 % IV SOLN
Freq: Once | INTRAVENOUS | Status: AC
  Filled 2020-05-27: qty 250

## 2020-05-27 MED ORDER — PROCHLORPERAZINE MALEATE 10 MG PO TABS
ORAL_TABLET | ORAL | Status: AC
  Filled 2020-05-27: qty 1

## 2020-05-27 MED ORDER — ACETAMINOPHEN 325 MG PO TABS
ORAL_TABLET | ORAL | Status: AC
  Filled 2020-05-27: qty 2

## 2020-05-27 MED ORDER — DIPHENHYDRAMINE HCL 25 MG PO CAPS
ORAL_CAPSULE | ORAL | Status: AC
  Filled 2020-05-27: qty 1

## 2020-05-27 MED ORDER — SODIUM CHLORIDE 0.9% FLUSH
10.0000 mL | INTRAVENOUS | Status: DC | PRN
  Administered 2020-05-27: 10 mL
  Filled 2020-05-27: qty 10

## 2020-05-27 MED ORDER — ACETAMINOPHEN 325 MG PO TABS
650.0000 mg | ORAL_TABLET | Freq: Once | ORAL | Status: AC
  Administered 2020-05-27: 650 mg via ORAL

## 2020-05-27 MED ORDER — SODIUM CHLORIDE 0.9 % IV SOLN
420.0000 mg | Freq: Once | INTRAVENOUS | Status: AC
  Administered 2020-05-27: 420 mg via INTRAVENOUS
  Filled 2020-05-27: qty 14

## 2020-05-27 MED ORDER — PROCHLORPERAZINE MALEATE 10 MG PO TABS
10.0000 mg | ORAL_TABLET | Freq: Once | ORAL | Status: AC
  Administered 2020-05-27: 10 mg via ORAL

## 2020-05-27 MED ORDER — TRASTUZUMAB-ANNS CHEMO 150 MG IV SOLR
450.0000 mg | Freq: Once | INTRAVENOUS | Status: AC
  Administered 2020-05-27: 450 mg via INTRAVENOUS
  Filled 2020-05-27: qty 21.43

## 2020-05-27 MED ORDER — HEPARIN SOD (PORK) LOCK FLUSH 100 UNIT/ML IV SOLN
500.0000 [IU] | Freq: Once | INTRAVENOUS | Status: AC | PRN
  Administered 2020-05-27: 500 [IU]
  Filled 2020-05-27: qty 5

## 2020-05-27 MED ORDER — DIPHENHYDRAMINE HCL 25 MG PO CAPS
25.0000 mg | ORAL_CAPSULE | Freq: Once | ORAL | Status: AC
  Administered 2020-05-27: 25 mg via ORAL

## 2020-05-27 MED ORDER — SODIUM CHLORIDE 0.9% FLUSH
10.0000 mL | Freq: Once | INTRAVENOUS | Status: AC
  Administered 2020-05-27: 10 mL
  Filled 2020-05-27: qty 10

## 2020-05-27 MED ORDER — PACLITAXEL PROTEIN-BOUND CHEMO INJECTION 100 MG
100.0000 mg/m2 | Freq: Once | INTRAVENOUS | Status: AC
  Administered 2020-05-27: 200 mg via INTRAVENOUS
  Filled 2020-05-27: qty 40

## 2020-05-27 MED FILL — TUKYSA 150 MG TABS: 150 | 30 days supply | Qty: 60 | Fill #0

## 2020-05-27 NOTE — Progress Notes (Signed)
[name]  [phone]) [phone] [phone]    ID: [name]   DOB: [birth year]  MR#: [id number]  TIW#:[id number]  Patient Care Team: Libby Maw, MD as PCP - General (Family Medicine) Erron Wengert, [name], MD as Consulting Physician (Oncology) [name], MD as Consulting Physician (Radiation Oncology) [name], [name], MD as Consulting Physician (Cardiology) [name], [name], MD as Consulting Physician (Psychiatry) [name], MD as Consulting Physician (Anesthesiology) [name], MD as Consulting Physician (Internal Medicine) [name], [name] 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   She receives Abraxane/trastuzumab/Pertuzumab monthly.  Her most recent treatment was 04/29/2020.    Her most recent echocardiogram on 12/25/2019 showed an ejection fraction of 60-65%.  When she came into our office on 5/14 she was unarousable in clinic.  She was given two 0.28m doses of narcan, and her somnolence remained, so she was taken to the ER and was found to have a right frontal lobe hemorrhagic brain metastases.  She received Dexamethasone and Keppra.   She was scheduled for follow up brain MRI on 04/16/2020 and 04/30/2020, but she did not show for either appointment.   REVIEW OF SYSTEMS: MTreazuretells me her right arm is more swollen.  She has been using the compression sleeve at night she says.  She says most of the day at home she is watching a pTheme park manageron TV.  She says her vision is good and she denies headaches.  She denies falls.  She says her family does not let her be by herself and also do not really  trust her to take care of her grandchild.  I think that probably very reasonable given the overall situation at this point.  She has had no fevers drenching sweats denies trouble swallowing, nausea or vomiting.  She denies any cough or shortness of breath.  She denies constipation.  She says her pain is not well controlled but she is not currently on methadone, which she did not pick up from the pharmacy as best as I can tell.   BREAST CANCER HISTORY: From the original intake note:  The patient originally presented in May 2003 when she noticed a lump in the upper inner quadrant of her right breast in September 2003.  She sought attention and had a mammogram which showed an obvious carcinoma in the upper inner quadrant of the right breast, approximately 2 cm.  There were some enlarged lymph nodes in the axilla and an FNA done showed those consistent with malignant cells, most likely an invasive ductal carcinoma.  At that point she was unsure of what to do and was referred by Dr. PMarylene Buergerfor a discussion of her treatment options.  By biopsy, it was ER/PR negative and HER-2 was 3+.  DNA index was 1.42.  We reiterated that it was most important to have her disease surgically addressed at that point and had recommended lumpectomy and axillary nodes to be addressed with which she followed through and on 04-03-02, Dr. YAnnamaria Bootsperformed a right partial mastectomy and a right axillary lymph node dissection.  Final pathology revealed a 2.4 cm. high grade, Grade III invasive ductal carcinoma with an adjacent .8 cm. also high grade invasive ductal  carcinoma which was felt to represent an intramammary metastasis rather than a second primary.  A smaller mass was just medial to the larger mass.  The smaller mass was associated with high grade DCIS component.  There was no definite lymphovascular invasion identified.  However, one of fourteen axillary lymph nodes did contain a 1.5 cm. metastatic deposit.  Postoperatively  she did very well.  We reiterated the fact that she did need adjuvant chemotherapy, however, she refused and decided that she would pursue radiation.  She received radiation and completed that on 08-14-02. Her subsequent history is as detailed below.   PAST MEDICAL HISTORY: Past Medical History:  Diagnosis Date  . Breast cancer ([place])   . DVT (deep venous thrombosis) ([place]) 10/07/2011  . Fever 02/06/2018  . Hypertension   . Radiation 11/29/2005-12/29/2005   right supraclavicular area 4147 cGy  . Radiation 06/26/02-08/14/02   right breast 5040 cGy, tumor bed boosted to 1260 cGy  . Rheumatic fever     PAST SURGICAL HISTORY: Past Surgical History:  Procedure Laterality Date  . BREAST SURGERY    . MASTECTOMY Bilateral   . PORT A CATH REVISION      FAMILY HISTORY Family History  Problem Relation Age of Onset  . Pancreatic cancer Mother   . Kidney cancer Sister 79  . Breast cancer Sister 23  . Breast cancer Other 21  The patient's father died in his 56P  from complications of alcohol abuse. The patient's mother died in her 31s from pancreatic cancer. The patient has 6 brothers, 2 sisters. Both her sisters have had breast cancer, both diagnosed after the age of 70. The patient has not had genetic testing so far.   GYNECOLOGIC HISTORY: Menarche age 41; first live birth age 21. She is GXP4. She is not sure when she stopped having periods. She never used hormone replacement.   SOCIAL HISTORY: The patient has worked in the past as a Psychologist, counselling. At home in addition to the patient are her husband [name], originally from [place], who works as a Administrator.  The other children are [name], who lives with the patient and has a college degree in psychology; she works as a Probation officer; no longer living in the home is her daughter [name] (she is studying to be a Marine scientist) and grandson.son [name], lives in [place] and works as a Higher education careers adviser. Son [name] also sometimes stays in the home. In addition  the patient has an aide who helps her almost daily.    ADVANCED DIRECTIVES: The patient has completed advanced directives and name her daughter [name] as her healthcare power of attorney.  The patient also completed a living will.  This is notarized.  These documents are being sent for scanning on epic on 01/15/2020   HEALTH MAINTENANCE: Social History   Tobacco Use  . Smoking status: Former Research scientist (life sciences)  . Smokeless tobacco: Never Used  Vaping Use  . Vaping Use: Never used  Substance Use Topics  . Alcohol use: Never    Comment: Rarely  . Drug use: No     Colonoscopy:  Not on file  PAP:  Not on file  Bone density:  Not on file  Lipid panel:  Not on file   Allergies  Allergen Reactions  . Tramadol Nausea Only  . Hydrocodone-Acetaminophen Itching and Rash  . Pork-Derived Products Other (See Comments)    Pt says she just doesn't eat pork; has no reaction to pork products    Current Outpatient  Medications  Medication Sig Dispense Refill  . acetaminophen (TYLENOL) 325 MG tablet Take 2 tablets (650 mg total) by mouth every 6 (six) hours as needed for mild pain (or Fever >/= 101).    Marland Kitchen allopurinol (ZYLOPRIM) 300 MG tablet Take 300 mg by mouth daily.    . Ascorbic Acid (VITAMIN C PO) Take 1 tablet by mouth daily.    . cholecalciferol (VITAMIN D3) 25 MCG (1000 UNIT) tablet Take 1,000 Units by mouth daily.    Marland Kitchen gabapentin (NEURONTIN) 100 MG capsule Take 1 capsule (300 mg total) by mouth 2 (two) times daily as needed. (Patient taking differently: Take 100 mg by mouth 2 (two) times daily. ) 60 capsule 3  . iron polysaccharides (NIFEREX) 150 MG capsule Take 1 capsule (150 mg total) by mouth daily. 30 capsule 0  . levETIRAcetam (KEPPRA) 500 MG tablet Take 1 tablet (500 mg total) by mouth 2 (two) times daily. 60 tablet 2  . MAGNESIUM PO Take 1 tablet by mouth daily.    . methadone (DOLOPHINE) 5 MG tablet Take 1 tablet (5 mg total) by mouth every 8 (eight) hours. 60  tablet 0  . Multiple Vitamin (MULTIVITAMIN) capsule Take 1 capsule by mouth daily.      . mupirocin ointment (BACTROBAN) 2 % Place 1 application into the nose 2 (two) times daily. 22 g 0  . omeprazole (PRILOSEC) 40 MG capsule Take 1 capsule (40 mg total) by mouth daily. (Patient taking differently: Take 40 mg by mouth daily as needed (heartburn). ) 30 capsule 1  . ondansetron (ZOFRAN) 4 MG tablet Take 1 tablet (4 mg total) by mouth 3 (three) times daily as needed. 20 tablet 3  . ondansetron (ZOFRAN) 4 MG tablet Take 1 tablet (4 mg total) by mouth every 6 (six) hours as needed for nausea. 20 tablet 0  . Potassium Chloride ER 20 MEQ TBCR Take 1 tablet by mouth daily.    . prochlorperazine (COMPAZINE) 10 MG tablet Take 1 tablet (10 mg total) by mouth every 6 (six) hours as needed for nausea or vomiting. 30 tablet 1  . sertraline (ZOLOFT) 100 MG tablet Take 1 tablet (100 mg total) by mouth daily. 90 tablet 2  . torsemide (DEMADEX) 20 MG tablet Take 10 mg by mouth daily.    Marland Kitchen triamterene-hydrochlorothiazide (DYAZIDE) 37.5-25 MG capsule Take 1 capsule by mouth daily.    . TUKYSA 150 MG tablet TAKE 2 TABLETS (300 MG TOTAL) BY MOUTH EVERY MORNING. TAKE AS DIRECTED BY MD. 60 tablet 3   No current facility-administered medications for this visit.   Facility-Administered Medications Ordered in Other Visits  Medication Dose Route Frequency Provider Last Rate Last Admin  . sodium chloride 0.9 % injection 10 mL  10 mL Intravenous PRN , [name], MD   10 mL at 03/04/14 1421  . sodium chloride flush (NS) 0.9 % injection 10 mL  10 mL Intracatheter PRN , [name], MD   10 mL at 07/30/19 1627    OBJECTIVE: African-American woman examined in a wheelchair  Vitals:   05/27/20 1056  BP: 103/90  Pulse: 64  Resp: 18  Temp: 98.5 F (36.9 C)  SpO2: 98%   Wt Readings from Last 3 Encounters:  05/27/20 183 lb (83 kg)  04/01/20 202 lb 2 oz (91.7 kg)  03/22/20 219 lb 2.2 oz (99.4 kg)   Body mass  index is 30.45 kg/m.    Sclerae unicteric, EOMs intact Wearing a mask Lungs no rales or rhonchi  Heart regular rate and rhythm Abd soft, nontender MSK chronic right upper extremity lymphedema, grade 2; she has multiple lesions on her back which appear stable to perhaps minimally improved (they are slightly flatter and are not erythematous). Neuro: Nonfocal; demanding affect Breasts: Deferred   LAB RESULTS:.  Lab Results  Component Value Date   WBC 7.7 05/27/2020   NEUTROABS 5.6 05/27/2020   HGB 10.4 (L) 05/27/2020   HCT 33.7 (L) 05/27/2020   MCV 84.9 05/27/2020   PLT 308 05/27/2020      Chemistry      Component Value Date/Time   NA 141 05/27/2020 0923   NA 142 11/09/2017 1214   K 3.9 05/27/2020 0923   K 3.9 11/09/2017 1214   CL 101 05/27/2020 0923   CL 101 03/07/2013 1411   CO2 29 05/27/2020 0923   CO2 32 (H) 11/09/2017 1214   BUN 12 05/27/2020 0923   BUN 19.1 11/09/2017 1214   CREATININE 1.10 (H) 05/27/2020 0923   CREATININE 1.0 11/09/2017 1214      Component Value Date/Time   CALCIUM 10.4 (H) 05/27/2020 0923   CALCIUM 9.1 11/09/2017 1214   ALKPHOS 116 05/27/2020 0923   ALKPHOS 120 11/09/2017 1214   AST 15 05/27/2020 0923   AST 30 11/09/2017 1214   ALT 7 05/27/2020 0923   ALT 19 11/09/2017 1214   BILITOT 0.2 (L) 05/27/2020 0923   BILITOT <0.22 11/09/2017 1214       STUDIES: No results found.   ASSESSMENT:  64 y.o. [place] woman with stage IV breast cancer manifested chiefly by loco-regional nodal disease (neck, chest) and skin involvement, without liver, bone, or brain metastases documented   (1) Status post right upper inner quadrant lumpectomy and sentinel lymph node sampling 03/04/2002 for 2 separate foci of invasive ductal carcinoma, mpT2 pN1 or stage IIB, both foci grade 3, both estrogen and progesterone receptor positive, both HercepTest 3+, Mib-1 56%  (2) Reexcision for margins 05/27/2002 showed no residual cancer in the breast.  (3) The  patient refused adjuvant systemic therapy.  (4) Adjuvant radiation treatment completed 08/14/2002.  (5) recurrence in the right breast in 02/2004 showing a morphologically different tumor, again grade 3, again estrogen and progesterone receptor negative, with an MIB-1 of 14% and Herceptest 3+.  (5) Between 03/2004 and 07/2004 she received dose dense Doxorubicin/Cyclophosphamide x 4 given with trastuzumab, followed by weekly Docetaxel x 8, again given with trastuzumab.  (6) Right mastectomy 07/13/2004 showed scattered microscopic foci of residual disease over an area  greater than 5 cm. Margins were negative.  (7) Postoperative Docetaxel continued until 09/2004.  (8) Trastuzumab (Herceptin) given 08/2004 through 01/2012 with some brief interruptions.  RECURRENT/ STAGE IV DISEASE December 2006 (9) Isolated right cervical nodal recurrence 10/2005, treated with radiation to the right supraclavicular area (total 41.5 gray) completed 12/29/2005.  (10) Navelbine given together with Herceptin  11/2005 through 03/2006.  (9) Left mastectomy 02/13/2006 for ductal carcinoma in situ, grade 2, estrogen and progesterone receptor negative, with negative margins; 0 of 3 lymph nodes involved  (10) treated with Lapatinib and Capecitabine before 10/2009, for an unclear duration and with unclear results (cannot locate data on chart review).  (11) Status post right supraclavicular lymph node biopsy 09/2010 again positive for an invasive ductal carcinoma, estrogen and progesterone receptor negative, HER-2 positive by CISH with a ratio 4.25.  (12) Navelbine given together with Herceptin between 05/2011 and 11/2011.  (13) Carboplatin/ Gemcitabine/ Herceptin given for 2 cycles, in 12/2011 and 01/2012.  (  14) TDM-1 ([place]) started 02/2012. Last dose 10/02/2013 after which the patient discontinued treatment at her own discretion. Echo on December 2014 showed a well preserved ejection fraction.  (15) Deep vein  thrombosis of the right upper extremity documented 04/20/2011.  She completed anticoagulant therapy with Coumadin on 03/25/2013.  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 Left chest Port-A-Cath placed on 04/08/2013; being flushed every 6 weeks  RIGHT UPPER EXTREMITY ANGIOSARCOMA VS BREAST CANCER: August 2016 (18) treated at [facility] [place] August 2016 with paclitaxel, trastuzumab and pertuzumab 1 cycle, afterwards referred to hospice  (19) under the care of hospice of [facility] August 2016 to 05/08/2016, when the patient opted for a second try at chemotherapy  (20) started low-dose Abraxane, trastuzumab and pertuzumab 06/07/2016, to be repeated every 4 weeks.   (a) pretreatment echocardiogram 06/06/2016 showed a 60-65% ejection fraction  (b) significant compliance problems compromise response to treatment  (c)  echocardiogram 02/20/17 LVEF 55-60%  (d) Abraxane discontinued 07/04/2017 because of possible neuropathy symptoms  (e) Abraxane resumed 09/27/2017  (f) echocardiogram 07/20/2017 showed an ejection fraction of 60-65%  (g) echo 01/22/2018 showed an ejection fraction of 50-55%  (h) CT scan of the chest 05/03/2018 shows no disease involving the lungs liver or lymph nodes, with stable subcutaneous masses as previously noted  (I) Abraxane discontinued August 2019 because of concerns regarding neuropathy-- gemcitabine substituted  (j) echocardiogram 08/20/2018 shows an ejection fraction in the 55-60% range (done every 6 months)  (k) gemcitabine discontinued because of multiple symptoms, Abraxane restarted 09/11/2018, given once every 4 weeks  (l) echocardiogram 07/17/2019 shows a well-preserved ejection fraction at 55-60%  (m) restaging PET  scan 08/20/2019 shows hypermetabolic adenopathy, subcutaneous malignancy as clinically appreciated, but no liver or lung parenchymal lesions   (21) Chronic pain secondary to known metastatic disease  (a) patient signed pain contract 10/19/2016  (b) as of 11/16/2016 receives her pain medications from Dr [name]  (c)  Dr [name] no longer able to prescribe as of February 2019  (d) all pain medications now through [facility], Dr Mirna Mires (as of April 2019)  (22) left breast and left axillary adenopathy noted clinically  (a) left axillary lymph node biopsy on 07/24/2019 benign  (23) BRAIN METASTASES: frontal and temporal lobe metastases noted on brain MRI 09/04/2019  (a) adjuvant radiation 09/11/2019-10/16/2019:    The brain was treated to 30 Gy in 10 fractions.   (b) tucatinib prescribed December 2020 at 150 mg daily because of methadone interaction, finally started March 2021 but taken irregularly by the patient.  (c) noncontrast head CT 01/12/2020 stable lesions/improved edema  (d) brain MRI 02/01/2020 shows decreased size of the right frontal and temporal lobe metastases with mild residual edema, no mass-effect, and no evidence of new mets  (e) noncontrast MRI of the cervical, thoracic, and lumbar spine 02/11/2020 and 02/12/2020 showed no metastatic disease  (24) COVID-19 infection 2019-11-19   PLAN: [place] did not show for her brain MRI scheduled twice in June.  I explained to her that it is very important for her to get this done and I have gone ahead and rescheduled it for next week.  She tells me Thursday should be okay.  She stated that we lie to her and do not tell her everything and that  I did not write her a methadone prescription.  I placed that prescription on 07/08 for 60 tablets.  However with new Medicare rules apparently the patient is only given a few days of treatment and then they have to seek an override.  We will see if we can work with her pharmacy to get  that accomplished.  She understands that other pain medication that she may need aside from Aleve or Tylenol she will need to get from her PCP or the pain clinic.  We have discussed this extensively previously.  She appears to be stable as far as her peripheral disease is concerned so we are continuing to treat her as before with Abraxane and anti-HER-2 agents.  This does take second place to her brain involvement.  Her most recent echo was in February and we will need to repeat that sometime in August  I appreciate our treatment area being able to work her in a little bit earlier since her appointment was for 2 PM and as she came in today at 27.  She will see me again in 4 weeks.  She knows to call for any other issues that may develop before the next visit.  Total encounter time 30 minutes.Sarajane Jews C. , MD 05/27/20 11:23 AM Medical Oncology and Hematology [facility] [place], [place] [address] Tel. [phone]    Fax. [phone]   I, Wilburn Mylar, am acting as scribe for Dr. [name]. .  I, Lurline Del MD, have reviewed the above documentation for accuracy and completeness, and I agree with the above.    *Total Encounter Time as defined by the Centers for Medicare and Medicaid Services includes, in addition to the face-to-face time of a patient visit (documented in the note above) non-face-to-face time: obtaining and reviewing outside history, ordering and reviewing medications, tests or procedures, care coordination (communications with other health care professionals or caregivers) and documentation in the medical record.

## 2020-05-27 NOTE — Telephone Encounter (Signed)
This RN contacted [facility] per pt stating she thought she only picked up 8 tablets of the pain medication prescribed - " [name] said I got 57 - but I only got 8 ".  [facility] states 60 tablets were dispensed and picked up 7/8 - pt had also been dispensed 8 tablets of oxycodone - prescribed by another physician - ( see note from Urgent Care ).  This RN will inform MD of the above.

## 2020-05-27 NOTE — Patient Instructions (Signed)
[facility] Discharge Instructions for Patients Receiving Chemotherapy  Today you received the following chemotherapy agents: trastuzumab, pertuzumab, and abraxane.  To help prevent nausea and vomiting after your treatment, we encourage you to take your nausea medication as directed.   If you develop nausea and vomiting that is not controlled by your nausea medication, call the clinic.   BELOW ARE SYMPTOMS THAT SHOULD BE REPORTED IMMEDIATELY:  *FEVER GREATER THAN 100.5 F  *CHILLS WITH OR WITHOUT FEVER  NAUSEA AND VOMITING THAT IS NOT CONTROLLED WITH YOUR NAUSEA MEDICATION  *UNUSUAL SHORTNESS OF BREATH  *UNUSUAL BRUISING OR BLEEDING  TENDERNESS IN MOUTH AND THROAT WITH OR WITHOUT PRESENCE OF ULCERS  *URINARY PROBLEMS  *BOWEL PROBLEMS  UNUSUAL RASH Items with * indicate a potential emergency and should be followed up as soon as possible.  Feel free to call the clinic should you have any questions or concerns. The clinic phone number is [phone].  Please show the CHEMO ALERT CARD at check-in to the Emergency Department and triage nurse.   

## 2020-05-28 ENCOUNTER — Other Ambulatory Visit: Payer: Self-pay | Admitting: Medical

## 2020-05-28 ENCOUNTER — Telehealth: Payer: Self-pay | Admitting: Hospice

## 2020-05-28 ENCOUNTER — Telehealth: Payer: Self-pay | Admitting: Oncology

## 2020-05-28 DIAGNOSIS — F418 Other specified anxiety disorders: Secondary | ICD-10-CM

## 2020-05-28 MED ORDER — SERTRALINE HCL 100 MG PO TABS: 100.0000 mg | ORAL_TABLET | Freq: Every day | ORAL | 2 refills | Status: DC

## 2020-05-28 NOTE — Telephone Encounter (Signed)
Made no changes to pt's schedule per 7/21 los

## 2020-05-29 NOTE — Telephone Encounter (Signed)
NP called patient and also daughters [name] and [name] several times for scheduled visit 3pm 05/28/2020 with no success; left voicemails with call back number

## 2020-05-31 IMAGING — CT CT HEAD W/O CM
3 series · 16 of 47 positions shown, 19 images · non-contrast
Comparison: 01/12/2020. Brain MR dated 09/04/2019.

CLINICAL DATA: Altered mental status. Positive [id number] test last
month. Breast cancer metastatic to the brain.

EXAM:
CT HEAD WITHOUT CONTRAST
TECHNIQUE: Contiguous axial images were obtained from the base of the skull
through the vertex without intravenous contrast.

[Series 2: head wo · axial · 0.45mm/px · z∈[-152,-22]mm · 10 of 32 slices shown, 13 images]
[im 3/32  brain]
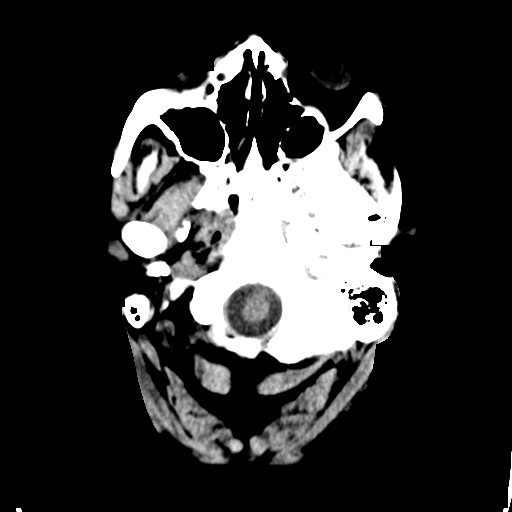
[im 3/32  bone]
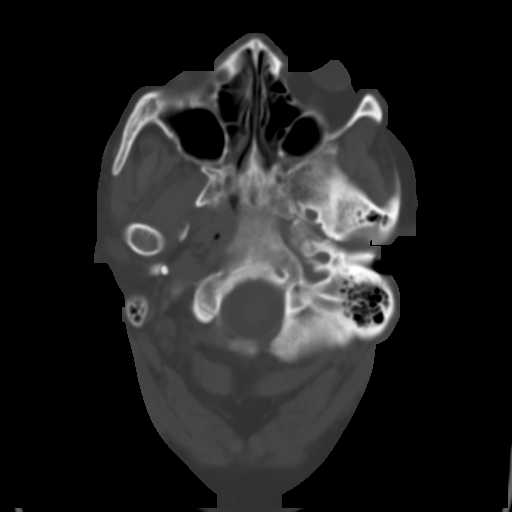
[im 6/32  brain]
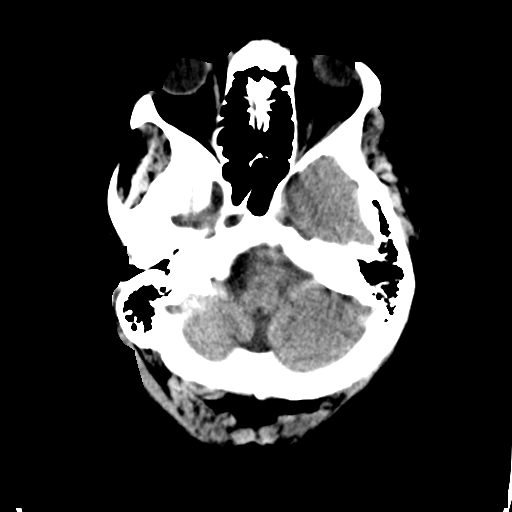
[im 9/32  brain]
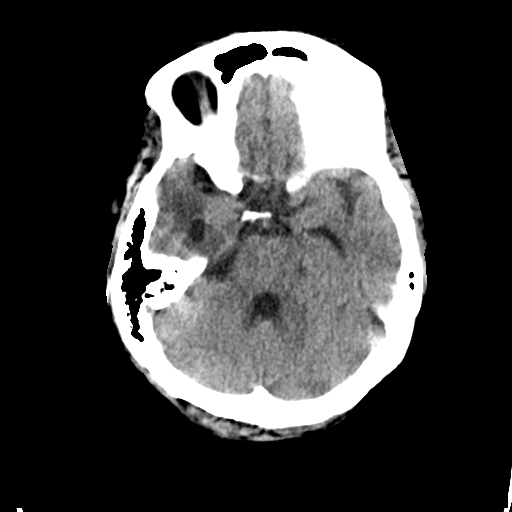
[im 11/32  brain]
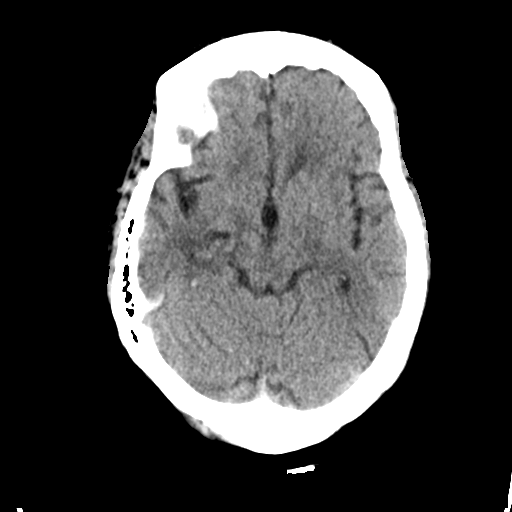
[im 14/32  brain]
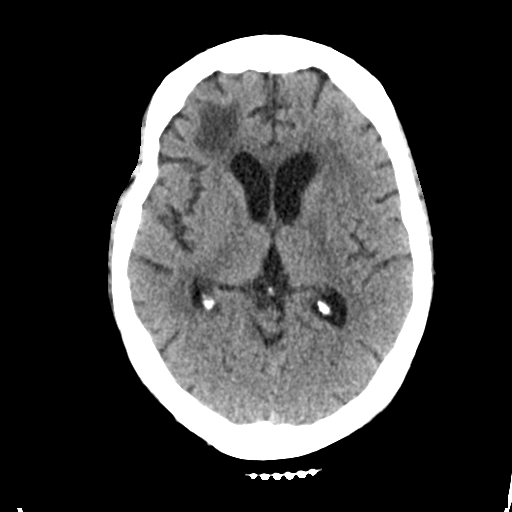
[im 14/32  bone]
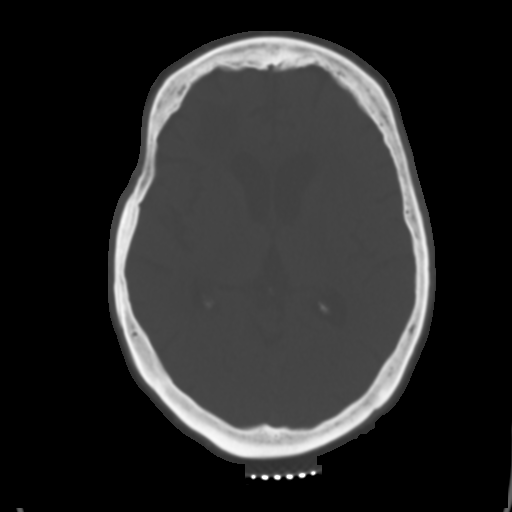
[im 18/32  brain]
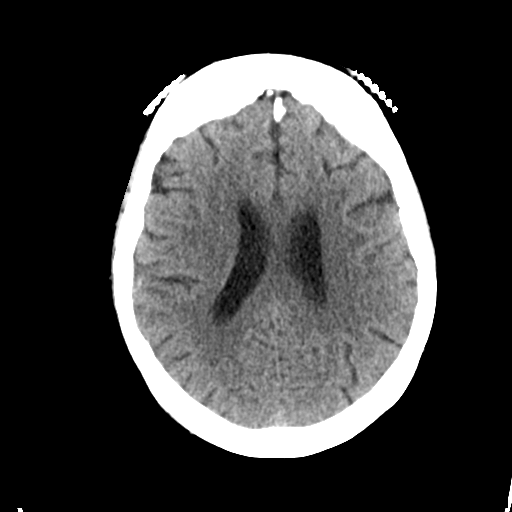
[im 21/32  brain]
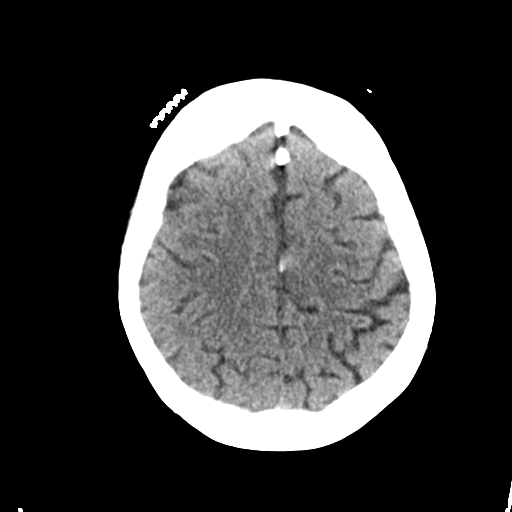
[im 24/32  brain]
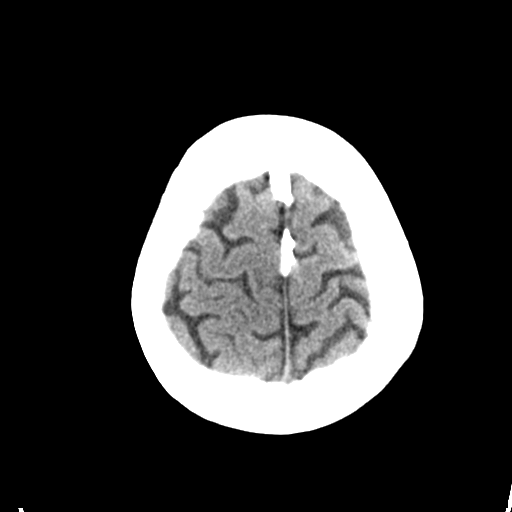
[im 26/32  brain]
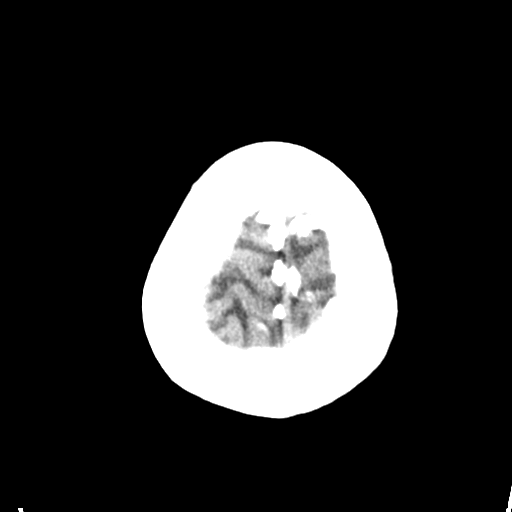
[im 26/32  bone]
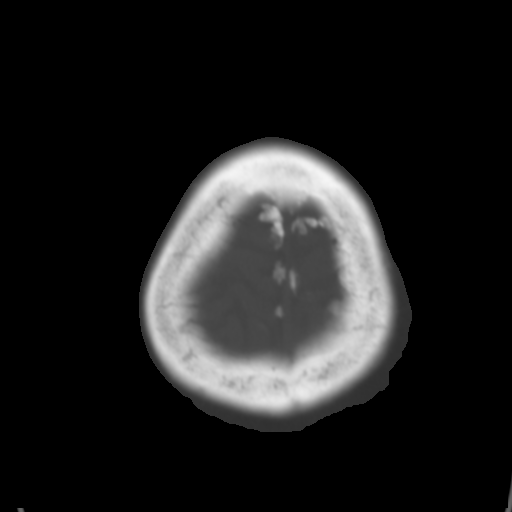
[im 29/32  brain]
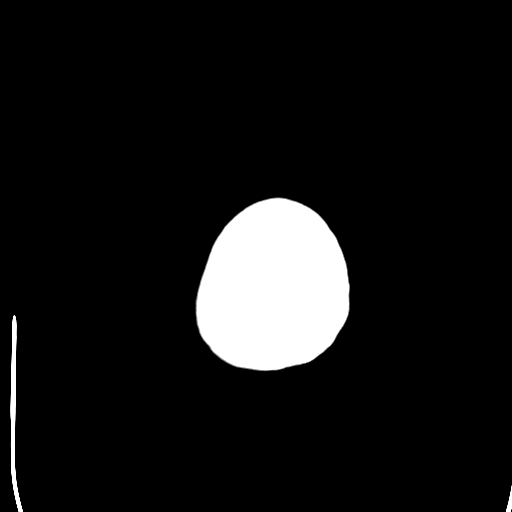

[Series 4: coronal soft tissue · coronal · 0.30mm/px · 3 of 67 slices shown]
[im 23/67  brain]
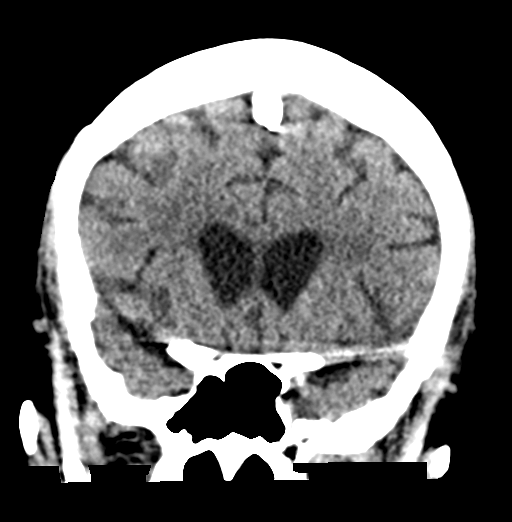
[im 30/67  brain]
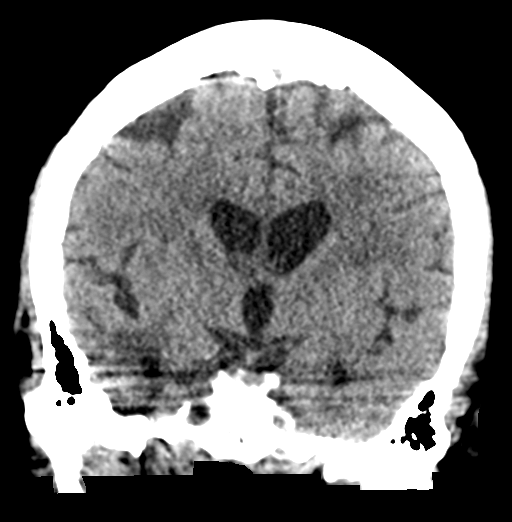
[im 37/67  brain]
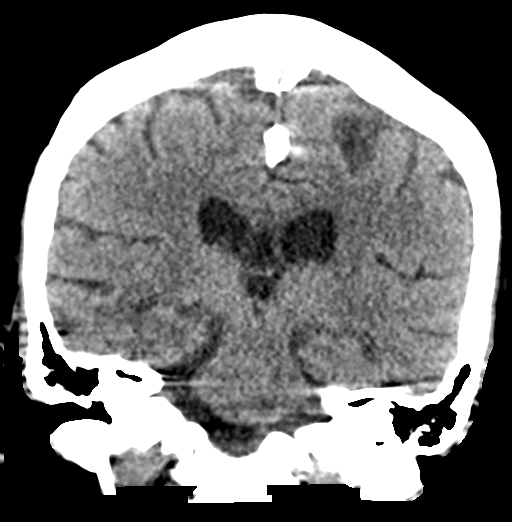

[Series 5: sagittal soft tissue · sagittal · 0.31mm/px · 3 of 50 slices shown]
[im 17/50  brain]
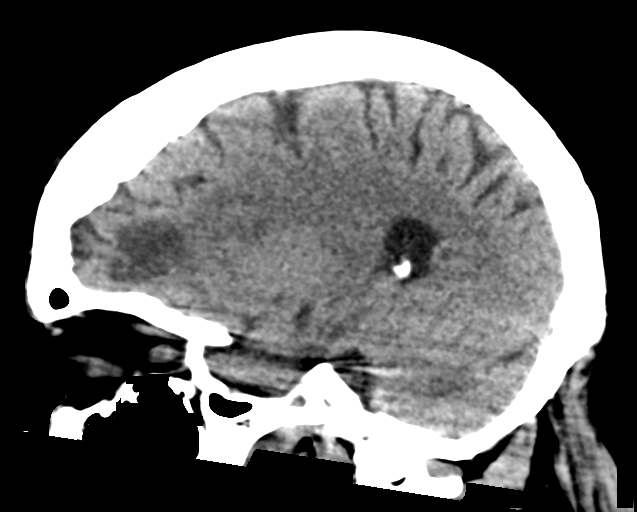
[im 25/50  brain]
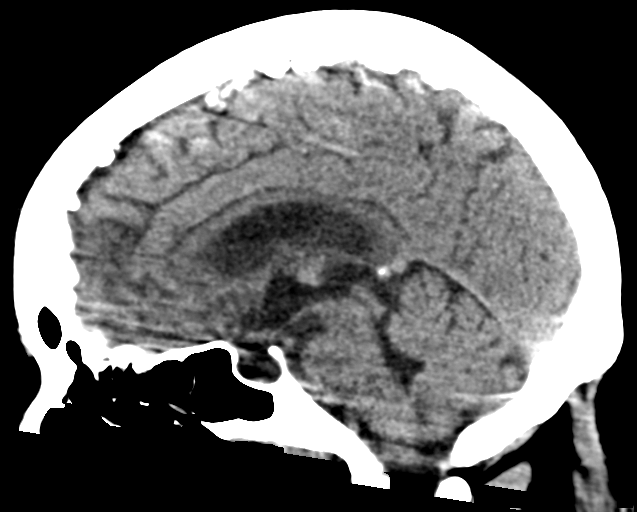
[im 33/50  brain]
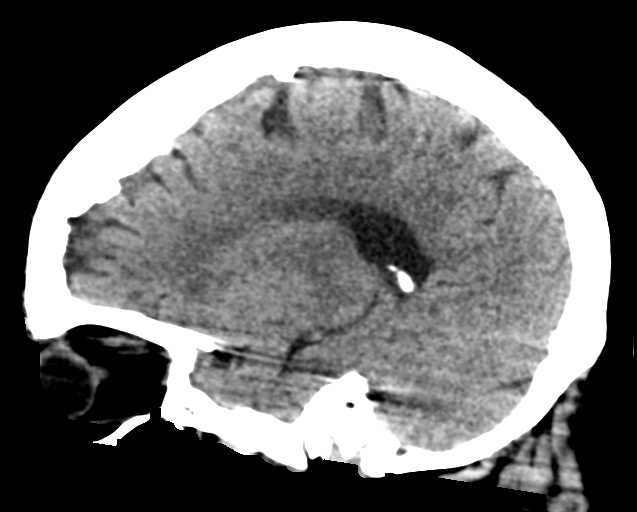

[16 of 47 positions shown; findings below may reference images not displayed]

FINDINGS: Brain: Stable areas of low density in the right frontal and temporal
lobes at the locations of the patient's known masses. Stable mild
patchy white matter low density in both cerebral hemispheres. No
intracranial hemorrhage or CT evidence of acute infarction. No mass
effect.

Vascular: No hyperdense vessel or unexpected calcification.

Skull: Normal. Negative for fracture or focal lesion.

Sinuses/Orbits: Unremarkable

Other: None.
IMPRESSION: 1. No acute abnormality.
2. Stable areas of low density in the right frontal and temporal
lobes at the locations of the patient's known masses compatible with
metastases.
3. Stable mild chronic small vessel white matter ischemic changes in
both cerebral hemispheres.

## 2020-05-31 IMAGING — CR DG CHEST 2V
2 series · 2 of 2 positions shown · non-contrast
Comparison: November 19, 2019.

CLINICAL DATA: Hypoxia and lower extremity edema

EXAM:
CHEST - 2 VIEW

[w chest lat]
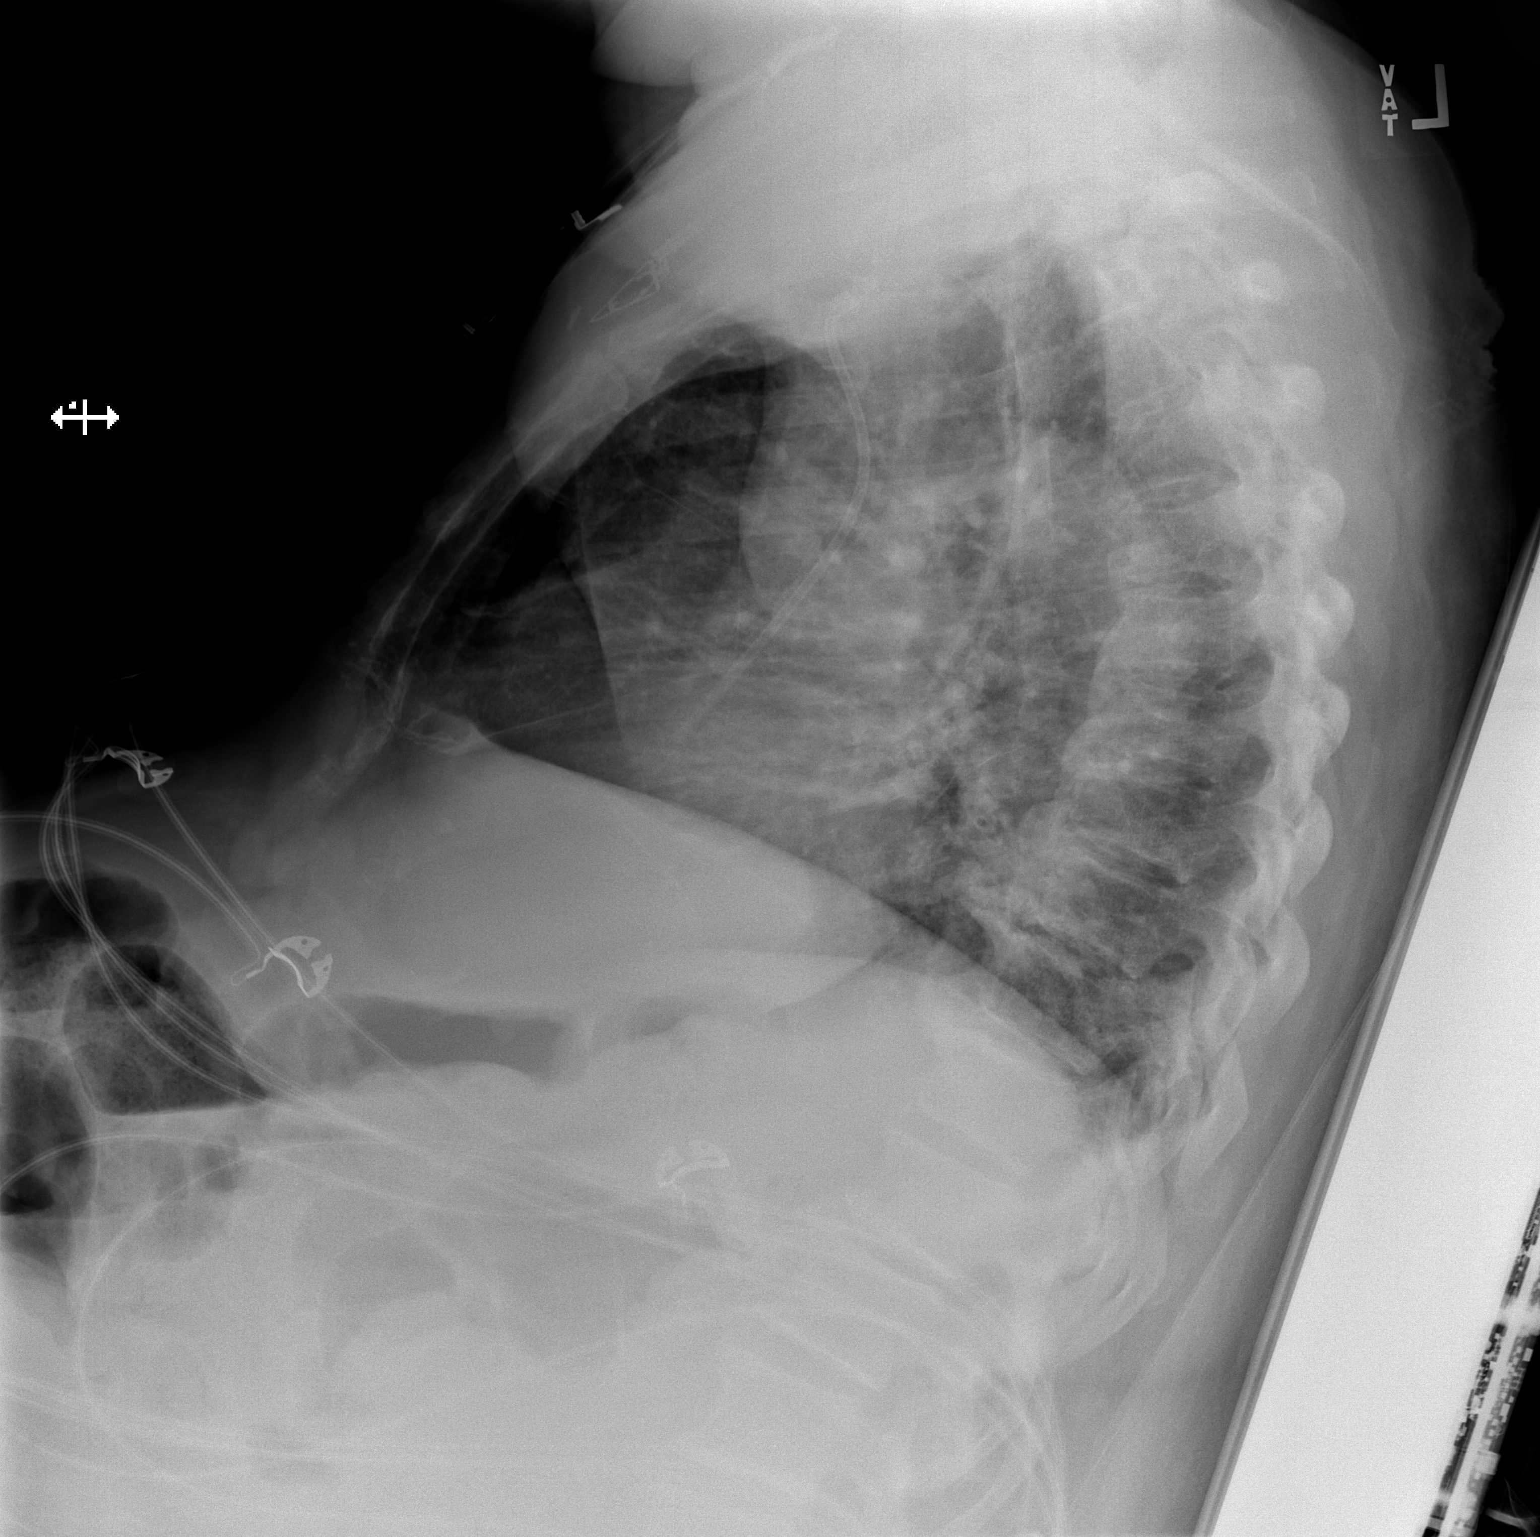

[x chest ap]
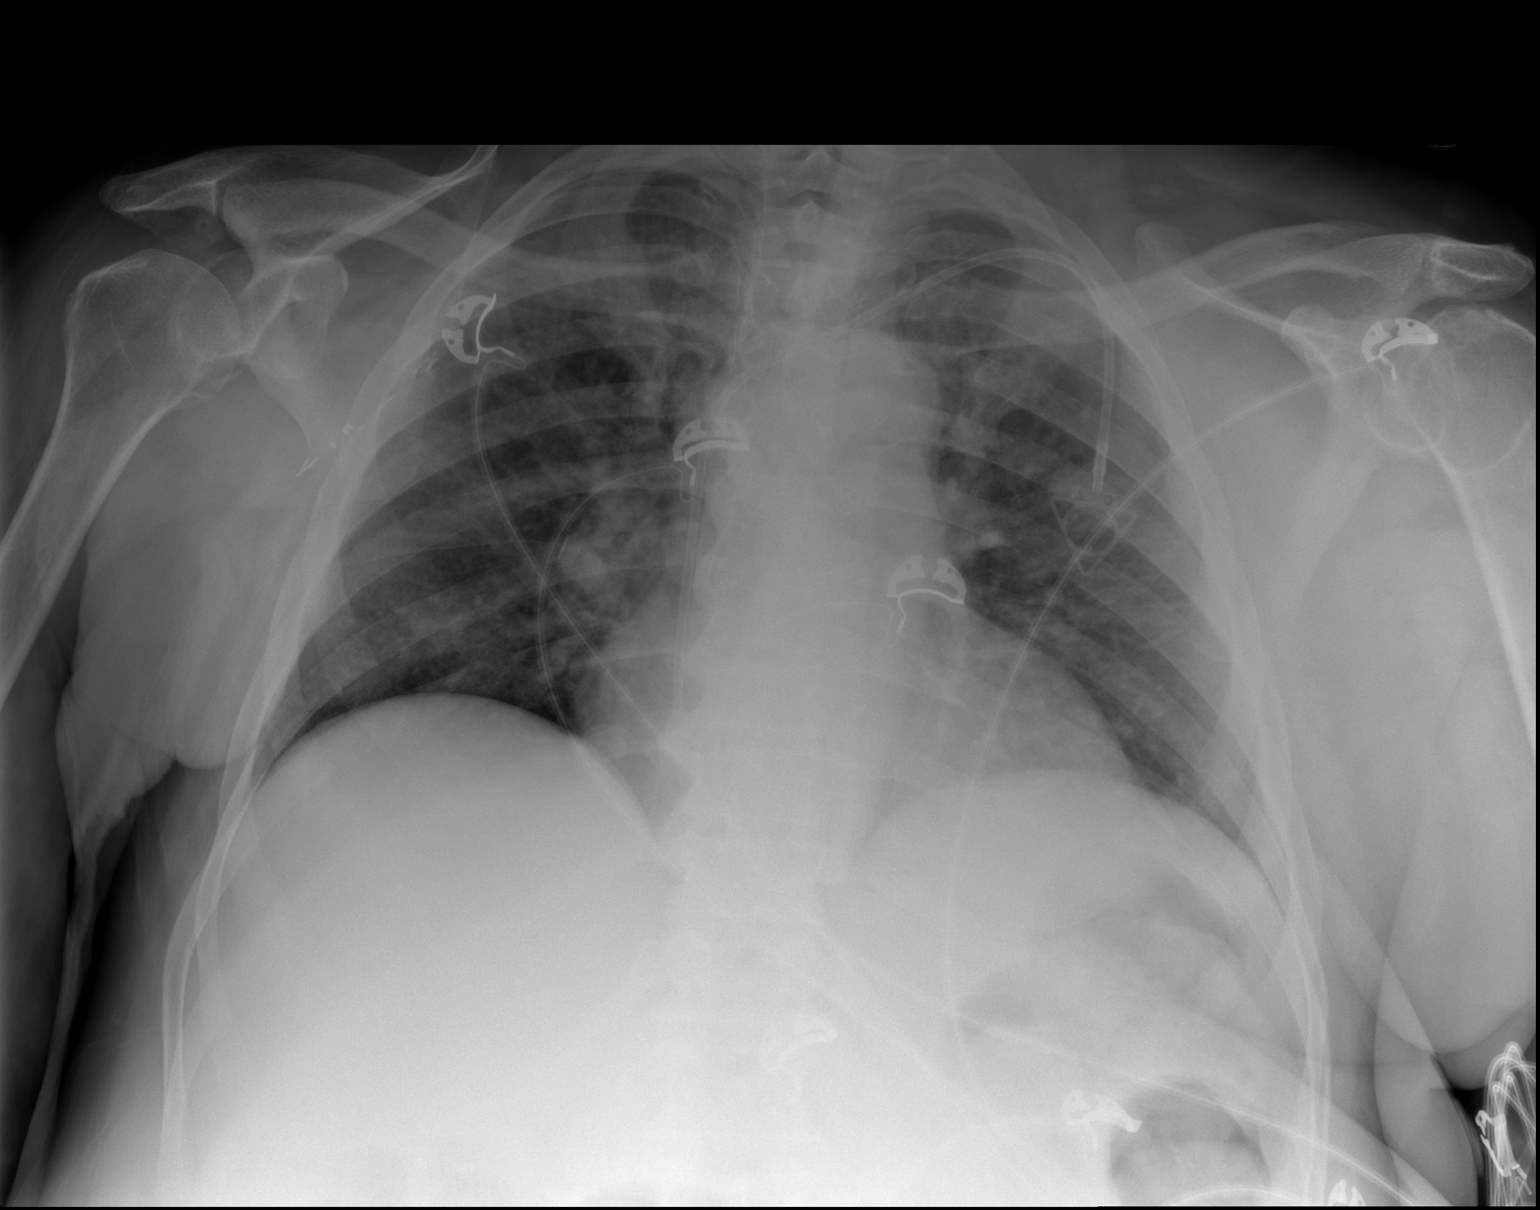

[2 of 2 positions shown; findings below may reference images not displayed]

FINDINGS: Port-A-Cath tip is at the cavoatrial junction. No pneumothorax.
Lungs are clear. Heart is upper normal in size with pulmonary
vascularity normal. No adenopathy. There is degenerative change in
the thoracic spine.
IMPRESSION: Port-A-Cath tip at cavoatrial junction. Lungs clear. Cardiac
silhouette within normal limits.

## 2020-06-02 ENCOUNTER — Telehealth: Payer: Self-pay | Admitting: Nurse Practitioner

## 2020-06-02 ENCOUNTER — Other Ambulatory Visit: Payer: Self-pay | Admitting: *Deleted

## 2020-06-02 ENCOUNTER — Other Ambulatory Visit: Payer: Self-pay | Admitting: Oncology

## 2020-06-02 ENCOUNTER — Telehealth: Payer: Self-pay | Admitting: Family Medicine

## 2020-06-02 ENCOUNTER — Telehealth: Payer: Self-pay | Admitting: *Deleted

## 2020-06-02 NOTE — Telephone Encounter (Addendum)
This RN spoke with pt per her call stating she has never picked up prescription for methadone prescribed earlier this month.  Note on 05/14/2020 prescription for methadone 5 mg # 60 was sent by MD to the [facility] on [address].  This RN called per pt visit on 7/21 and verified the medication was picked up with documentation per driver license number of person picking it up. This RN informed pt of this at that visit and she needed to follow up with her family on who picked medication up.  This RN today reiterated above as well as pt is now being followed by Dr Primus Bravo for pain management.  Note Patriece stated " I don't see him"  This RN asked  If she had seen him at all - with pt then stating " yeah I did a urine drug test and he said I had xanax in my urine but I don't take it- I take what you gave me "- with pt stating drug as zoloft.  This RN explained to [employer] does not show as a benzodiazipine in the urine- urine drug test are very specific.  This RN stated concern to Lavonya of fact of known medical issues and need for pain control- as well as she can only obtain pain medications are to be done by one MD- which ideally is Dr Primus Bravo.  Jasey then stated she has a follow up appointment with Dr Primus Bravo 06/30/2020 " and he wants to prescribe a patch and then said I need to go to rehab "  This RN informed pt Dr Primus Bravo spoke with [name] - with plan for Dr Primus Bravo to manage her pain issues.   This RN validated our concern for her safety with medications for pain as well has possible other forms of drugs she either has left over in her home or that she may be getting from other resources that is showing up in her urine that she for her pain that could have very detrimental outcomes.  Our goal is to manage her cancer diagnosis to keep her as healthy as possible and Dr Primus Bravo is willing to manage her pain issues. This RN again validated that she has 2 doctors who want the best for her  outcome.  Cyrena attempted to return to issue of " I didn't get the methadone " - with this RN again stating prescription was sent by this office to her pharmacy and noted as picked up as well as presently no form of narcotics will be prescribed by this office. Again stated to pt plan of care by [name] and Dr Primus Bravo is to keep her healthy.  Lillianne stated " Ok "- " I guess I will call Dr Primus Bravo "  Call ended at this time.

## 2020-06-02 NOTE — Progress Notes (Signed)
I was called by Dr. Primus Bravo from the pain clinic to let me know they did a urine screen on [name] which showed fentanyl oxycodone cocaine Xanax and methadone.  He suggests that we not give her any narcotics and I agree since this can result in overdose given her use of street drugs.  From our point of view we are going to stick with nonsteroidals and Tylenol.  He will try to detoxify her although he tells me she did not express an interest in that.  After that he will advise her on pain medication.  I have discontinued her methadone.

## 2020-06-02 NOTE — Telephone Encounter (Signed)
Damita Dunnings called from Dr. Juliane Lack office and stated that Dr. Juliane Lack wanted to speak to someone regarding patients medication. Informed office that Dr. Ethelene Hal is out of the office and message would be sent to the provider of the day. Office is asking for a call back today. CB is [phone]

## 2020-06-02 NOTE — Telephone Encounter (Signed)
Spoke with both Ducan and Dr. Juliane Lack who would like for all of patients pain medications to stop. Dr. Juliane Lack wanting to speak with doc of the day regarding this matter. Phone number to Dr. Juliane Lack given to [place] to call when she is available

## 2020-06-02 NOTE — Telephone Encounter (Signed)
Dr. Juliane Lack with [facility] pain clinic informed me abotu Ms. Umland abnormal drug screen. He states she was positive for oxycodone, methadone, alprazolam, cocaine and fentanyl.

## 2020-06-05 IMAGING — CR DG CHEST 2V
2 series · 2 of 2 positions shown · non-contrast
Comparison: Chest radiographs 01/16/2020 and earlier

CLINICAL DATA: Leg swelling. Additional history provided: Bilateral
lower extremity swelling, right arm swelling, history of breast
cancer, multiple falls.

EXAM:
CHEST - 2 VIEW

[w chest pa]
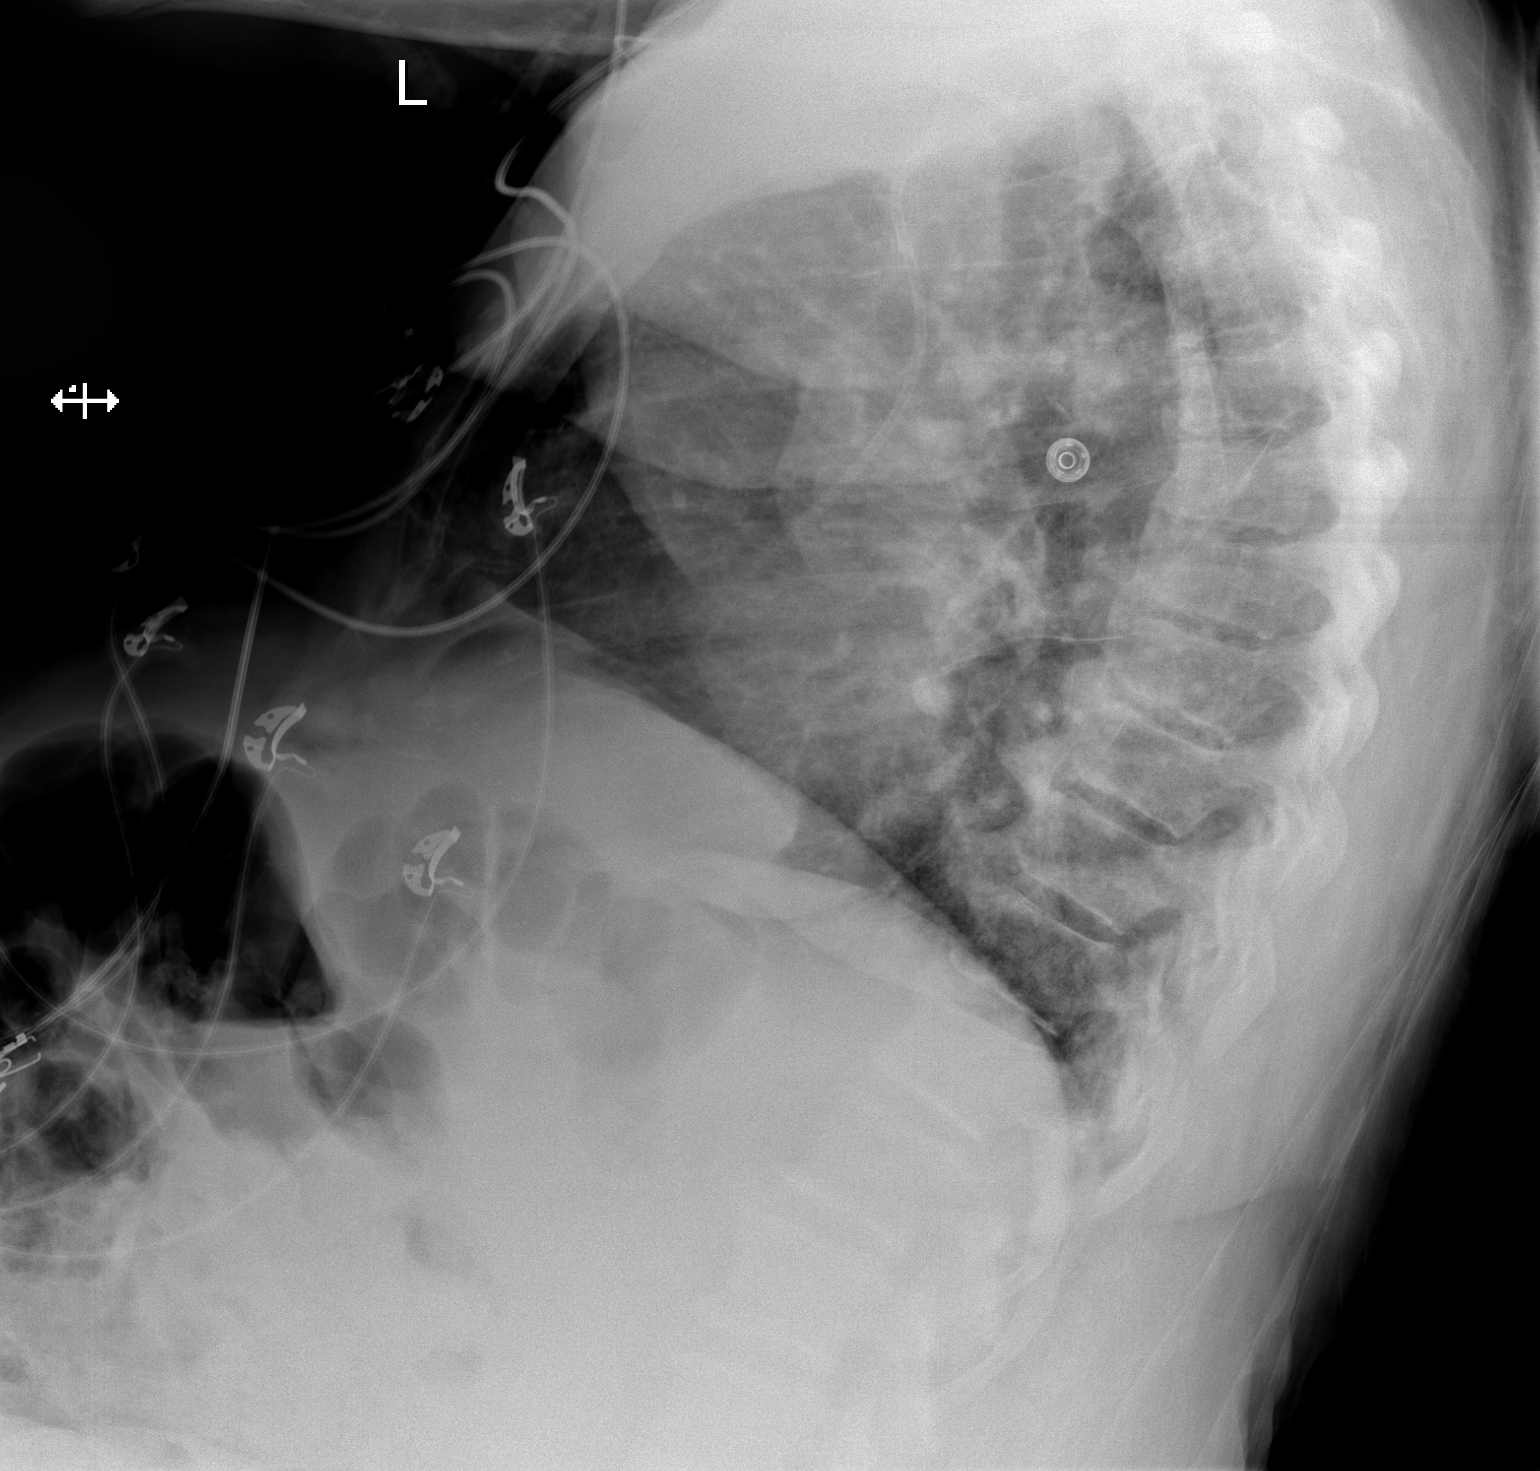

[x chest ap]
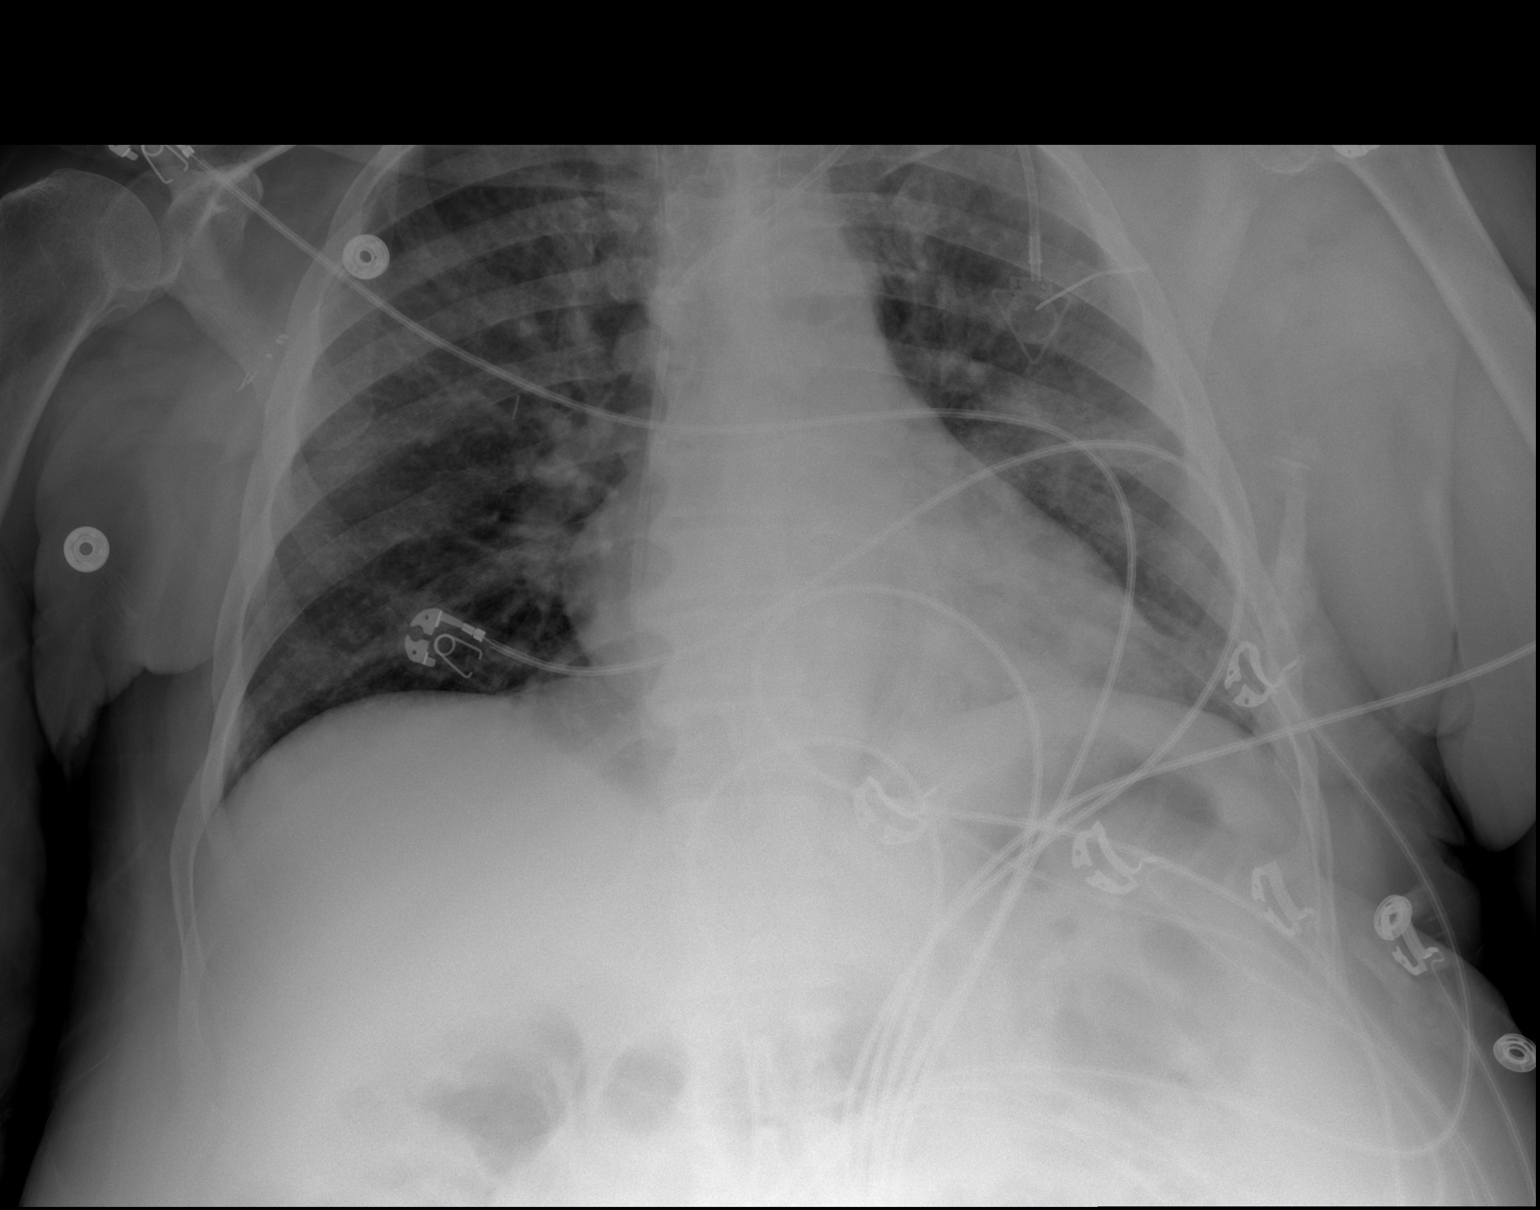

[2 of 2 positions shown; findings below may reference images not displayed]

FINDINGS: Stable position of a left chest infusion port catheter with tip
projecting in the region of the right atrium. Heart size at the
upper limits of normal, unchanged. Bilateral interstitial and
airspace opacities, more conspicuous as compared to prior exam
01/16/2020. No evidence of pleural effusion or pneumothorax.
Thoracic spondylosis.
IMPRESSION: Bilateral interstitial and ill-defined airspace opacities, more
conspicuous as compared to prior exam 01/16/2020. Findings may
reflect pulmonary edema. Infection cannot be excluded.

## 2020-06-05 IMAGING — CR DG FOREARM 2V*R*
3 series · 3 of 3 positions shown · non-contrast
Comparison: None.

CLINICAL DATA: Right arm pain and swelling

EXAM:
RIGHT FOREARM - 2 VIEW

[x forearm ap right (1 of 2)]
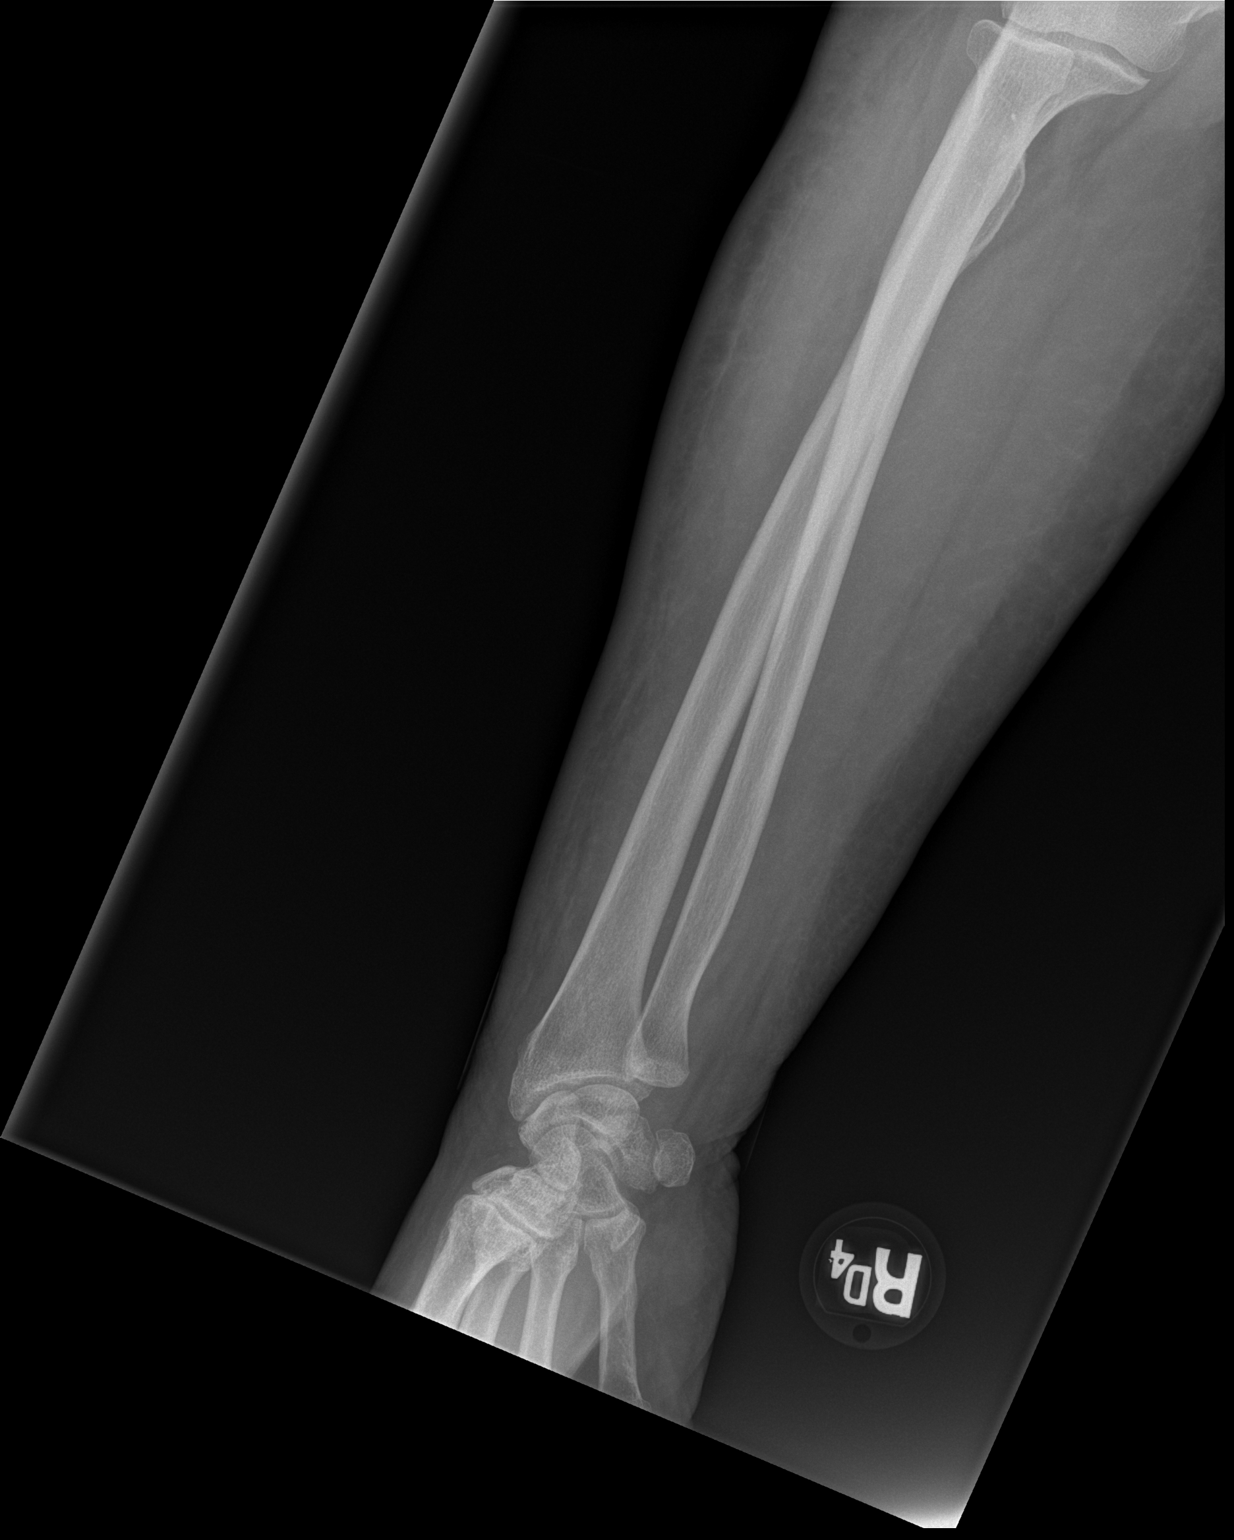

[x forearm ap right (2 of 2)]
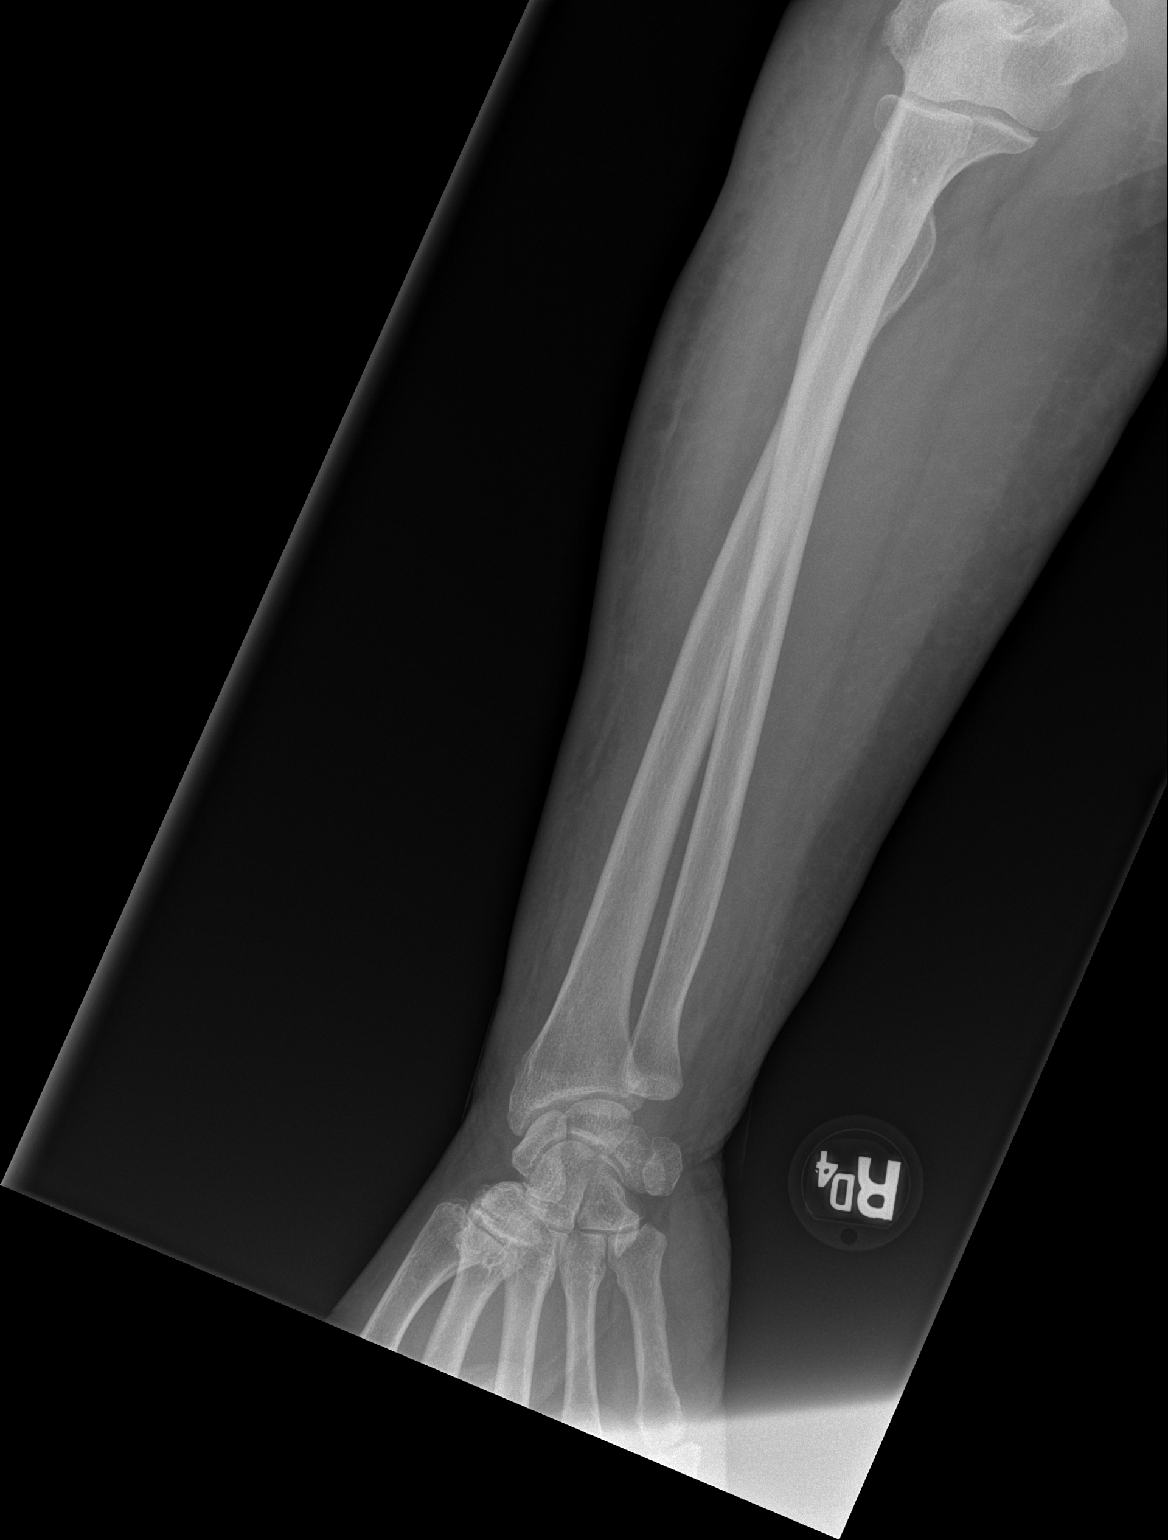

[x forearm lat right]
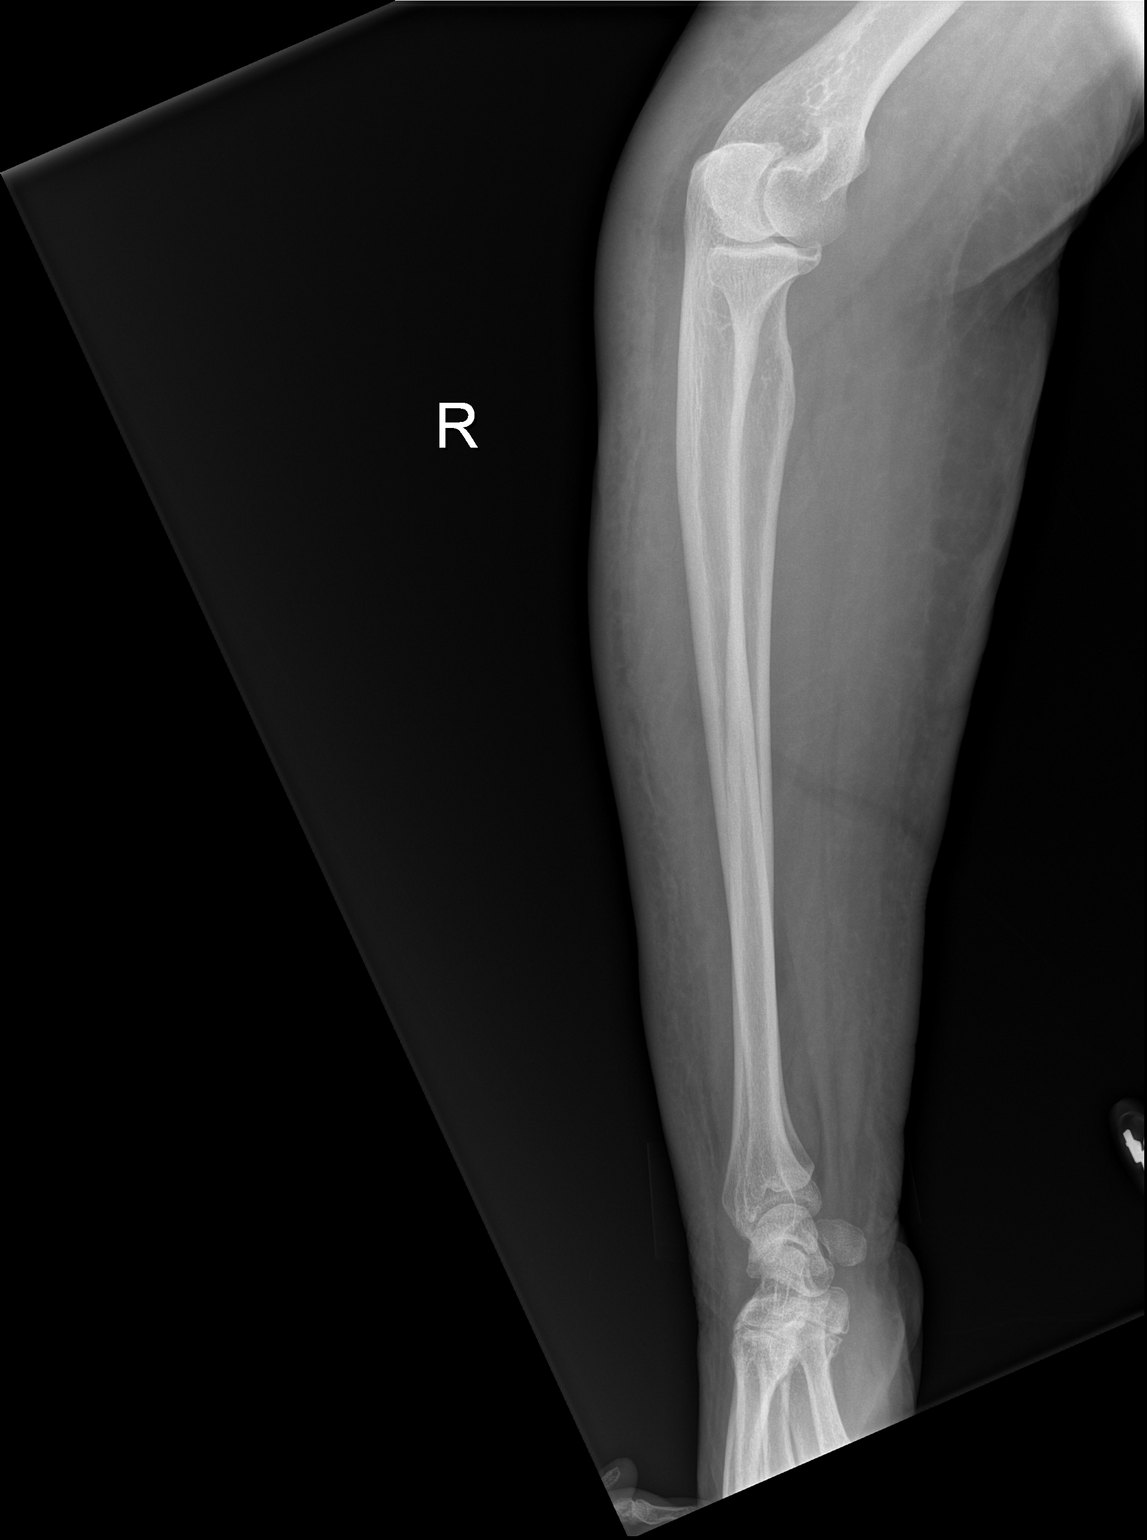

[3 of 3 positions shown; findings below may reference images not displayed]

FINDINGS: There is no evidence of fracture or other focal bone lesions. No
cortical destruction or periostitis. Diffuse circumferential soft
tissue swelling is evident throughout the forearm. No soft tissue
gas or radiopaque foreign body is seen.
IMPRESSION: Diffuse circumferential soft tissue swelling throughout the forearm.
No acute bony abnormality.

## 2020-06-05 IMAGING — CR DG HAND COMPLETE 3+V*R*
3 series · 3 of 3 positions shown · non-contrast
Comparison: None

CLINICAL DATA: Swelling, history breast cancer

EXAM:
RIGHT HAND - COMPLETE 3+ VIEW

[x hand pa right]
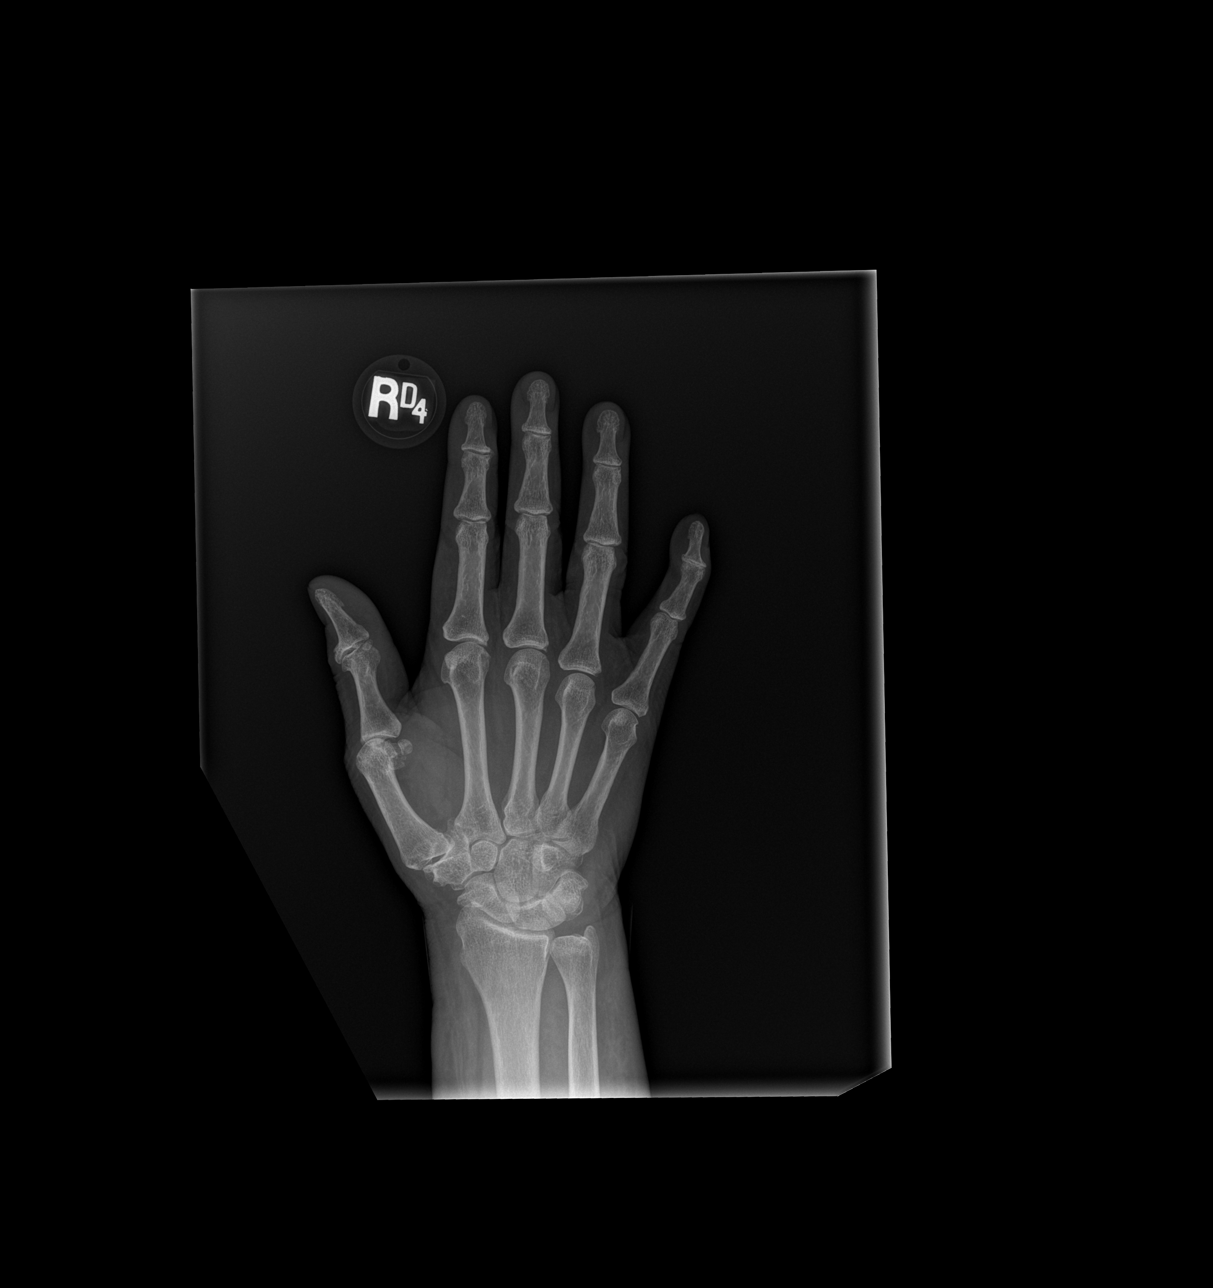

[x hand obl right]
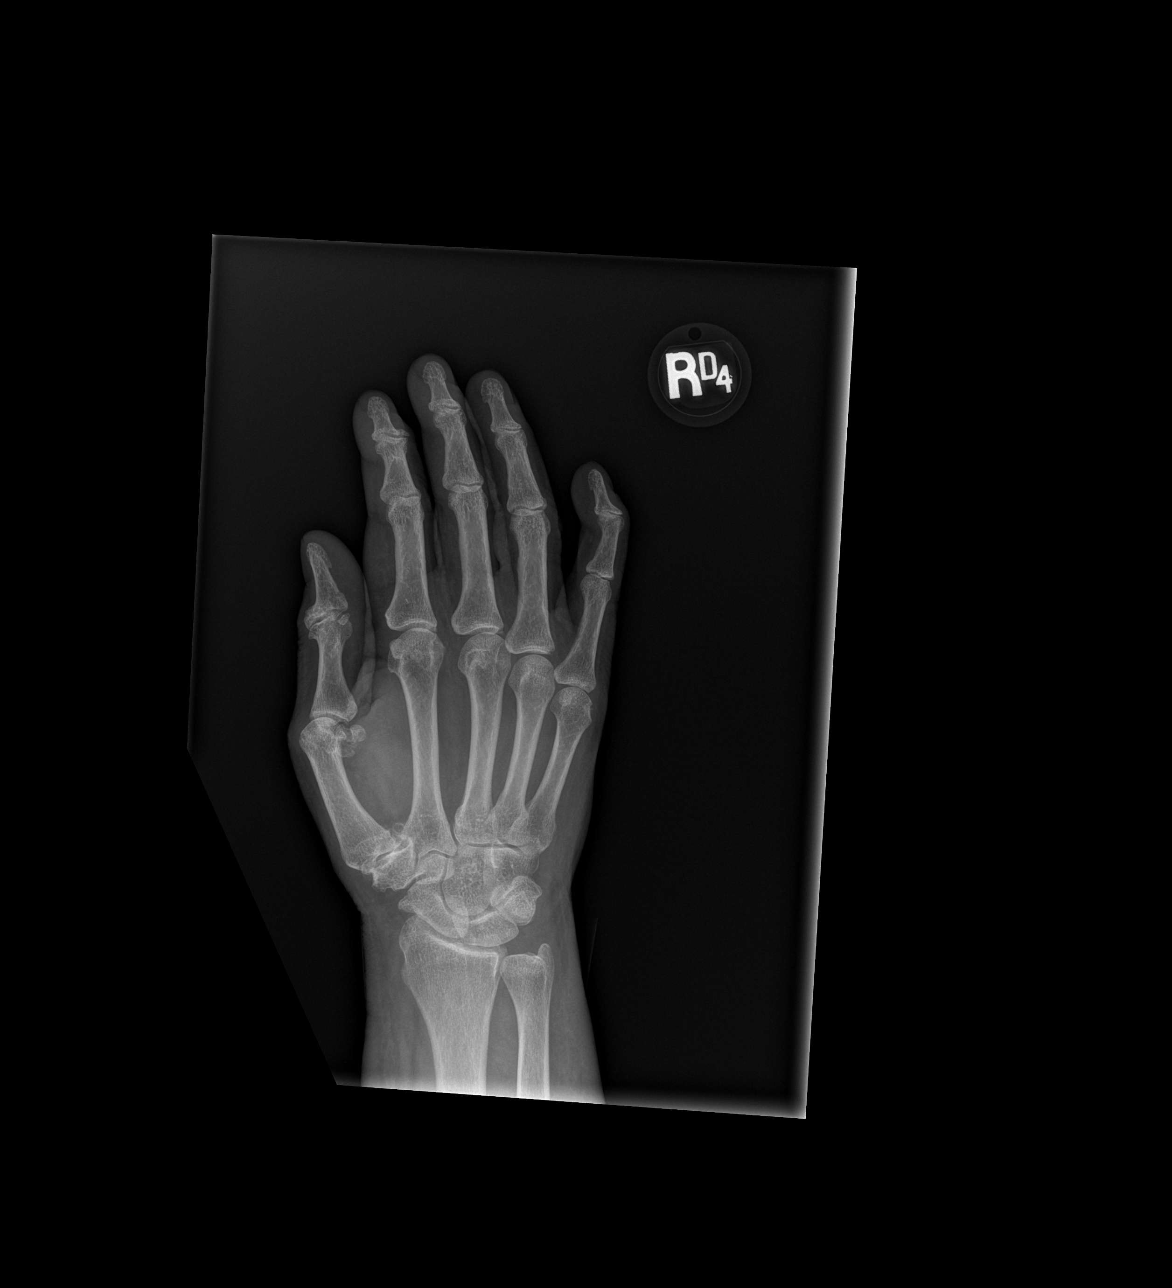

[x hand lat right]
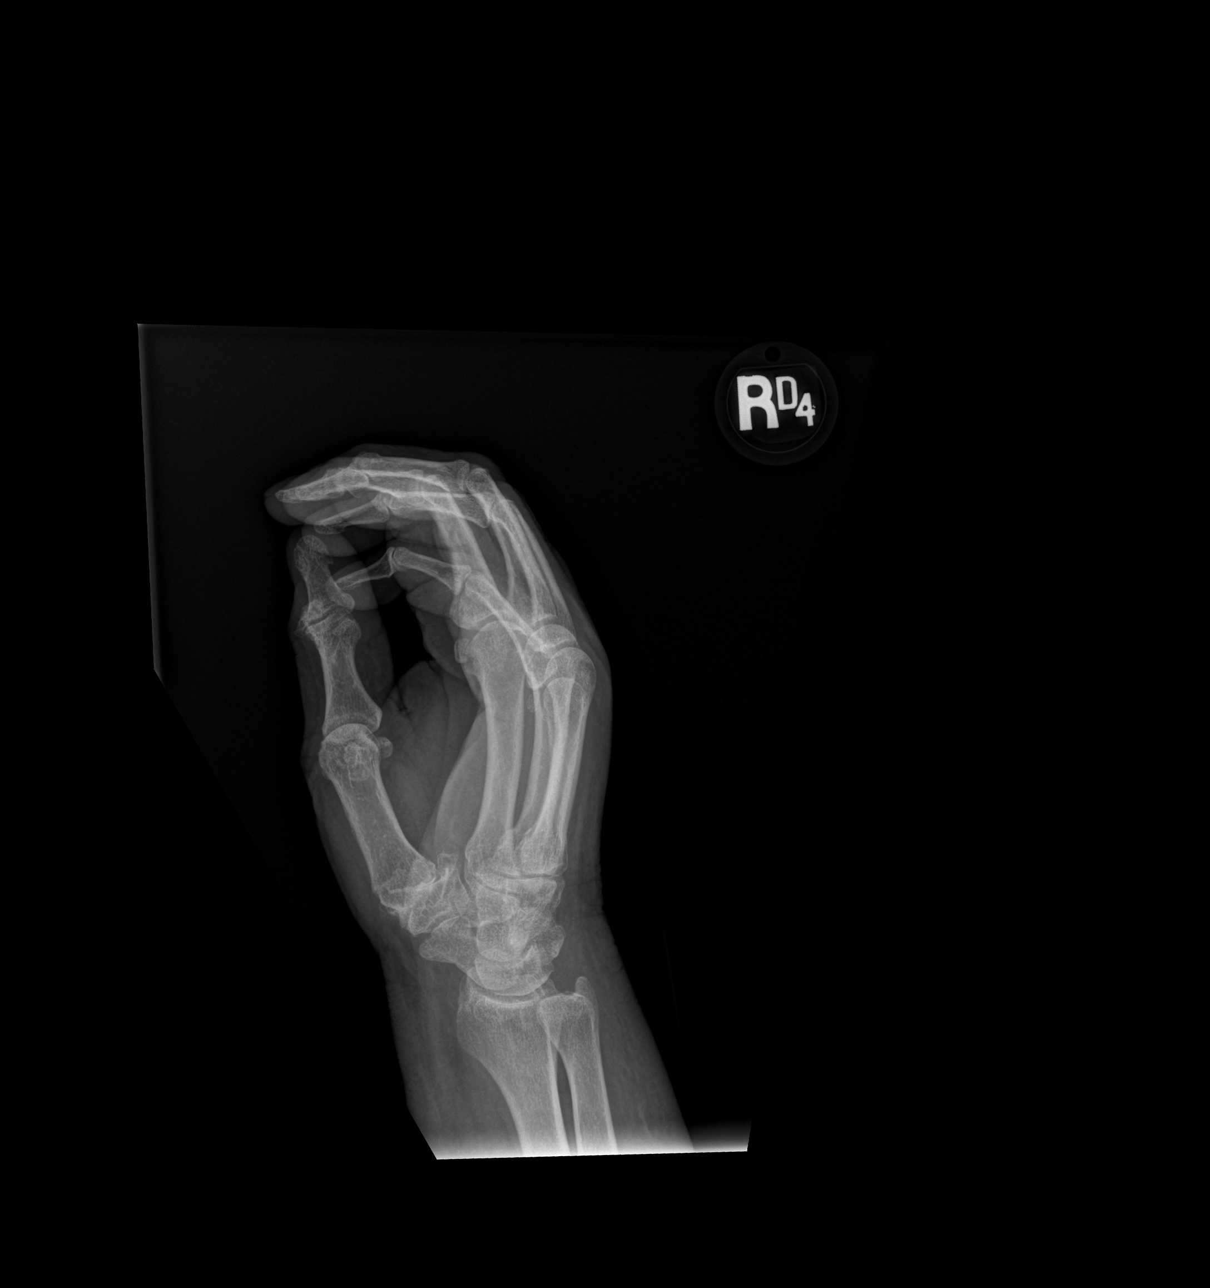

[3 of 3 positions shown; findings below may reference images not displayed]

FINDINGS: Osseous demineralization.

Scattered mild degenerative changes greatest at first CMC joint and
IP joint thumb.

Scattered soft tissue swelling.

Fingers superimposed on lateral view limiting assessment.

No acute fracture, dislocation or bone destruction.
IMPRESSION: Scattered degenerative changes and soft tissue swelling.

No acute osseous abnormalities.

## 2020-06-05 IMAGING — CR DG TIBIA/FIBULA 2V*L*
2 series · 2 of 2 positions shown · non-contrast
Comparison: 12/29/2010

CLINICAL DATA: Left leg pain

EXAM:
LEFT TIBIA AND FIBULA - 2 VIEW

[x tib-fib ap left]
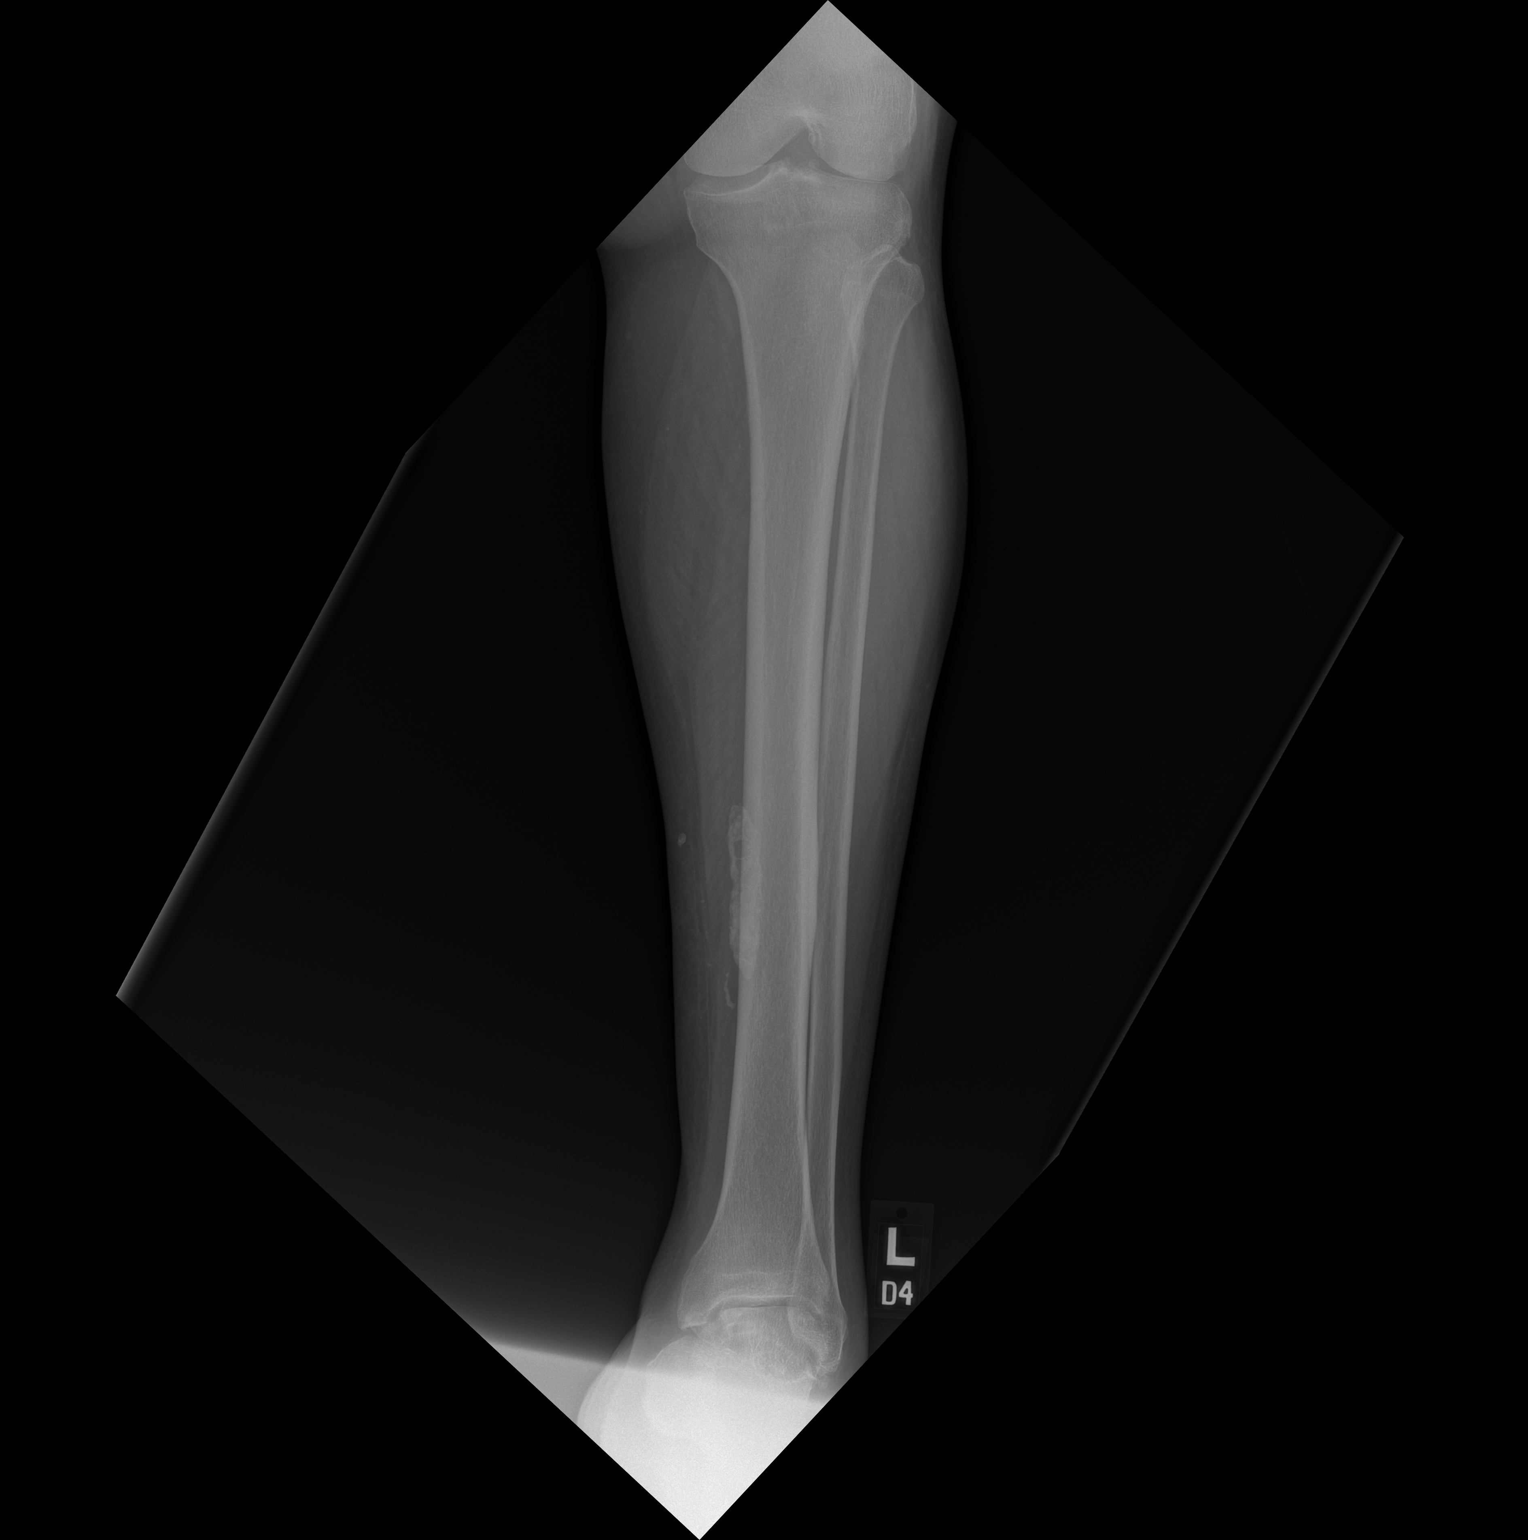

[x tib-fib lat left]
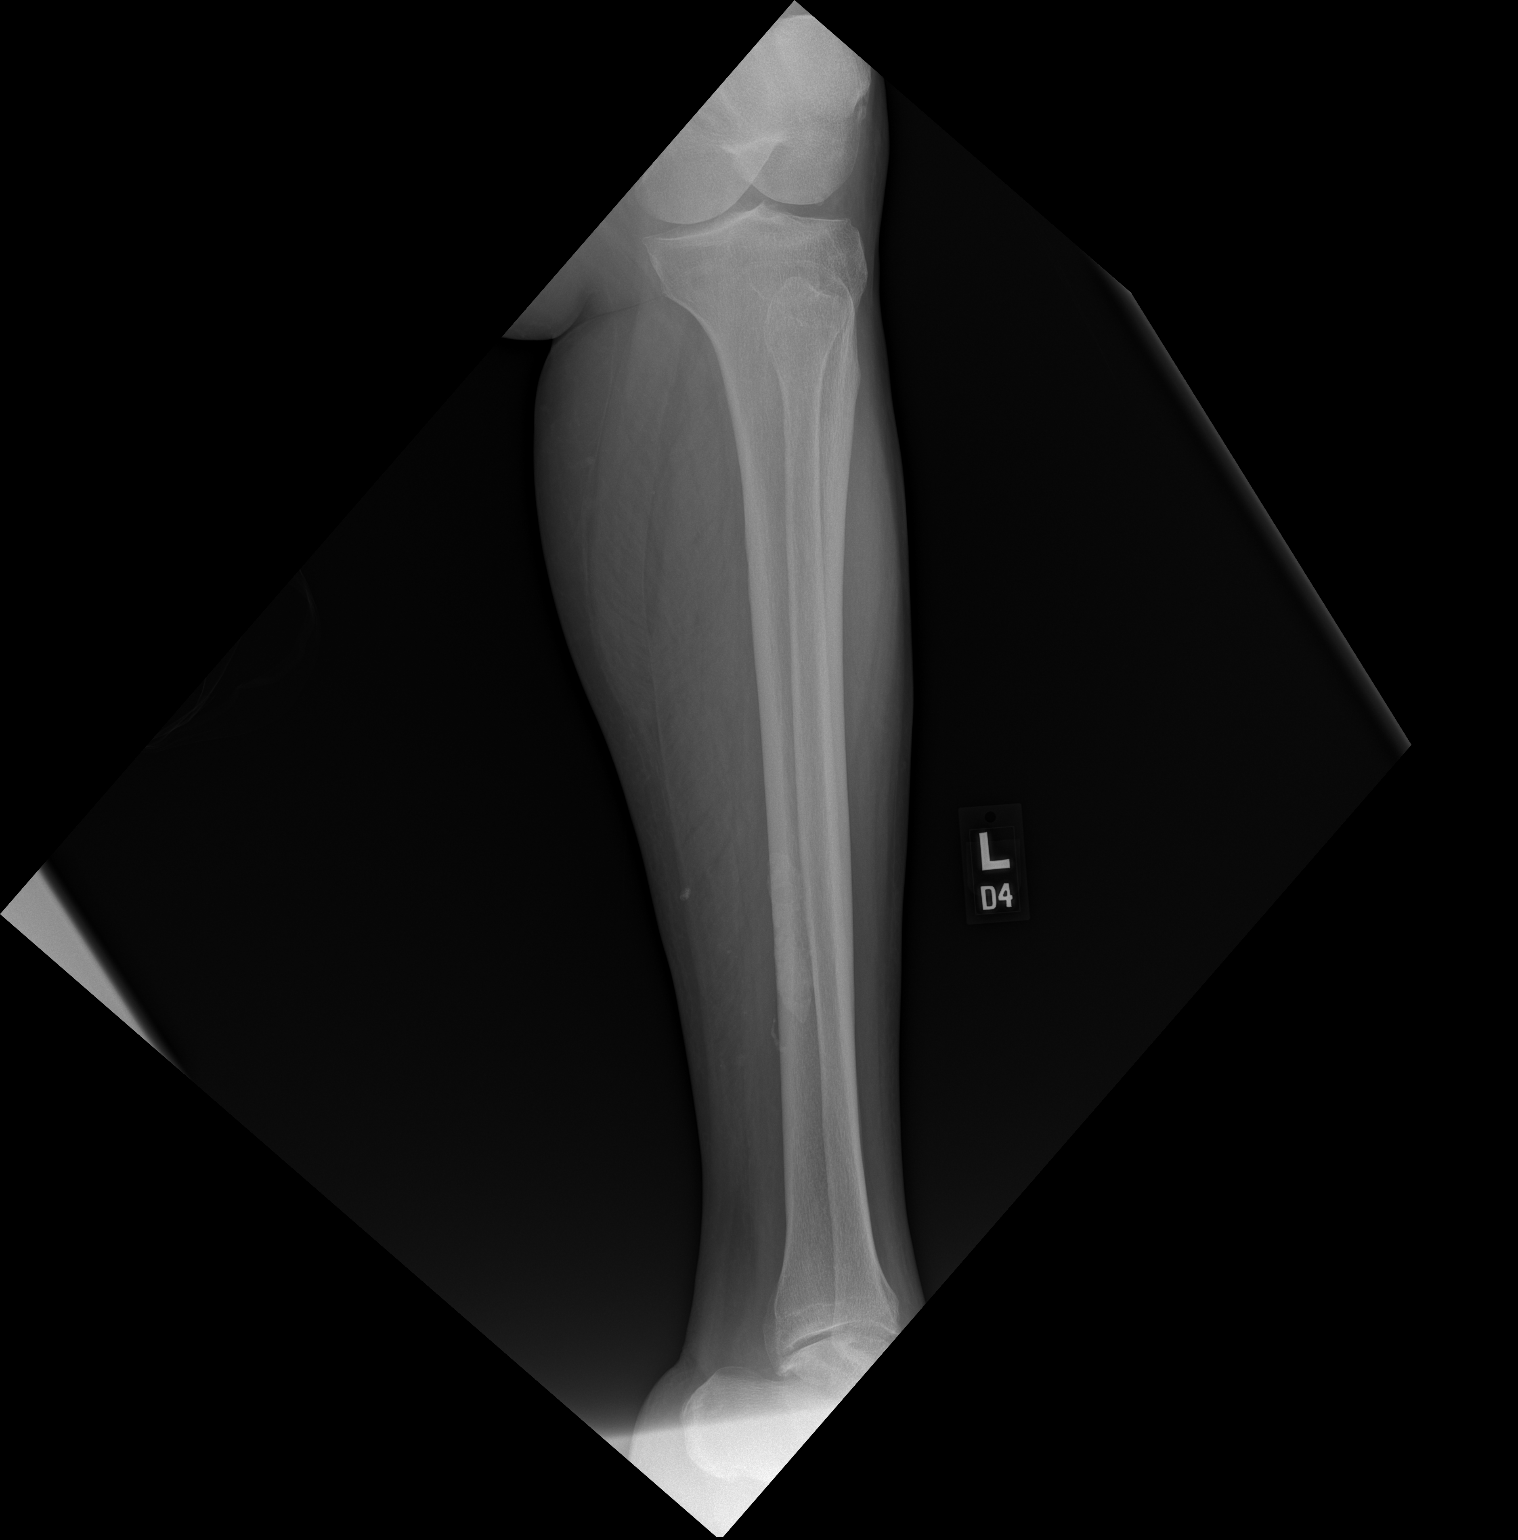

[2 of 2 positions shown; findings below may reference images not displayed]

FINDINGS: There is no evidence of fracture or other focal bone lesions. No
cortical destruction or periostitis. Mineralization is again noted
within the soft tissues medial to the mid tibial diaphysis,
unchanged from prior study 12/29/2010. Soft tissues are otherwise
unremarkable.
IMPRESSION: Negative.

## 2020-06-08 ENCOUNTER — Ambulatory Visit: Payer: Medicare Other | Admitting: Family Medicine

## 2020-06-09 IMAGING — DX DG CHEST 2V
2 series · 2 of 2 positions shown · non-contrast
Comparison: 01/21/2020

CLINICAL DATA: Cough and shortness of breath. History of breast
cancer. Patient is unsteady on her feet. Patient has difficulty
holding still.

EXAM:
CHEST - 2 VIEW

[chest pa]
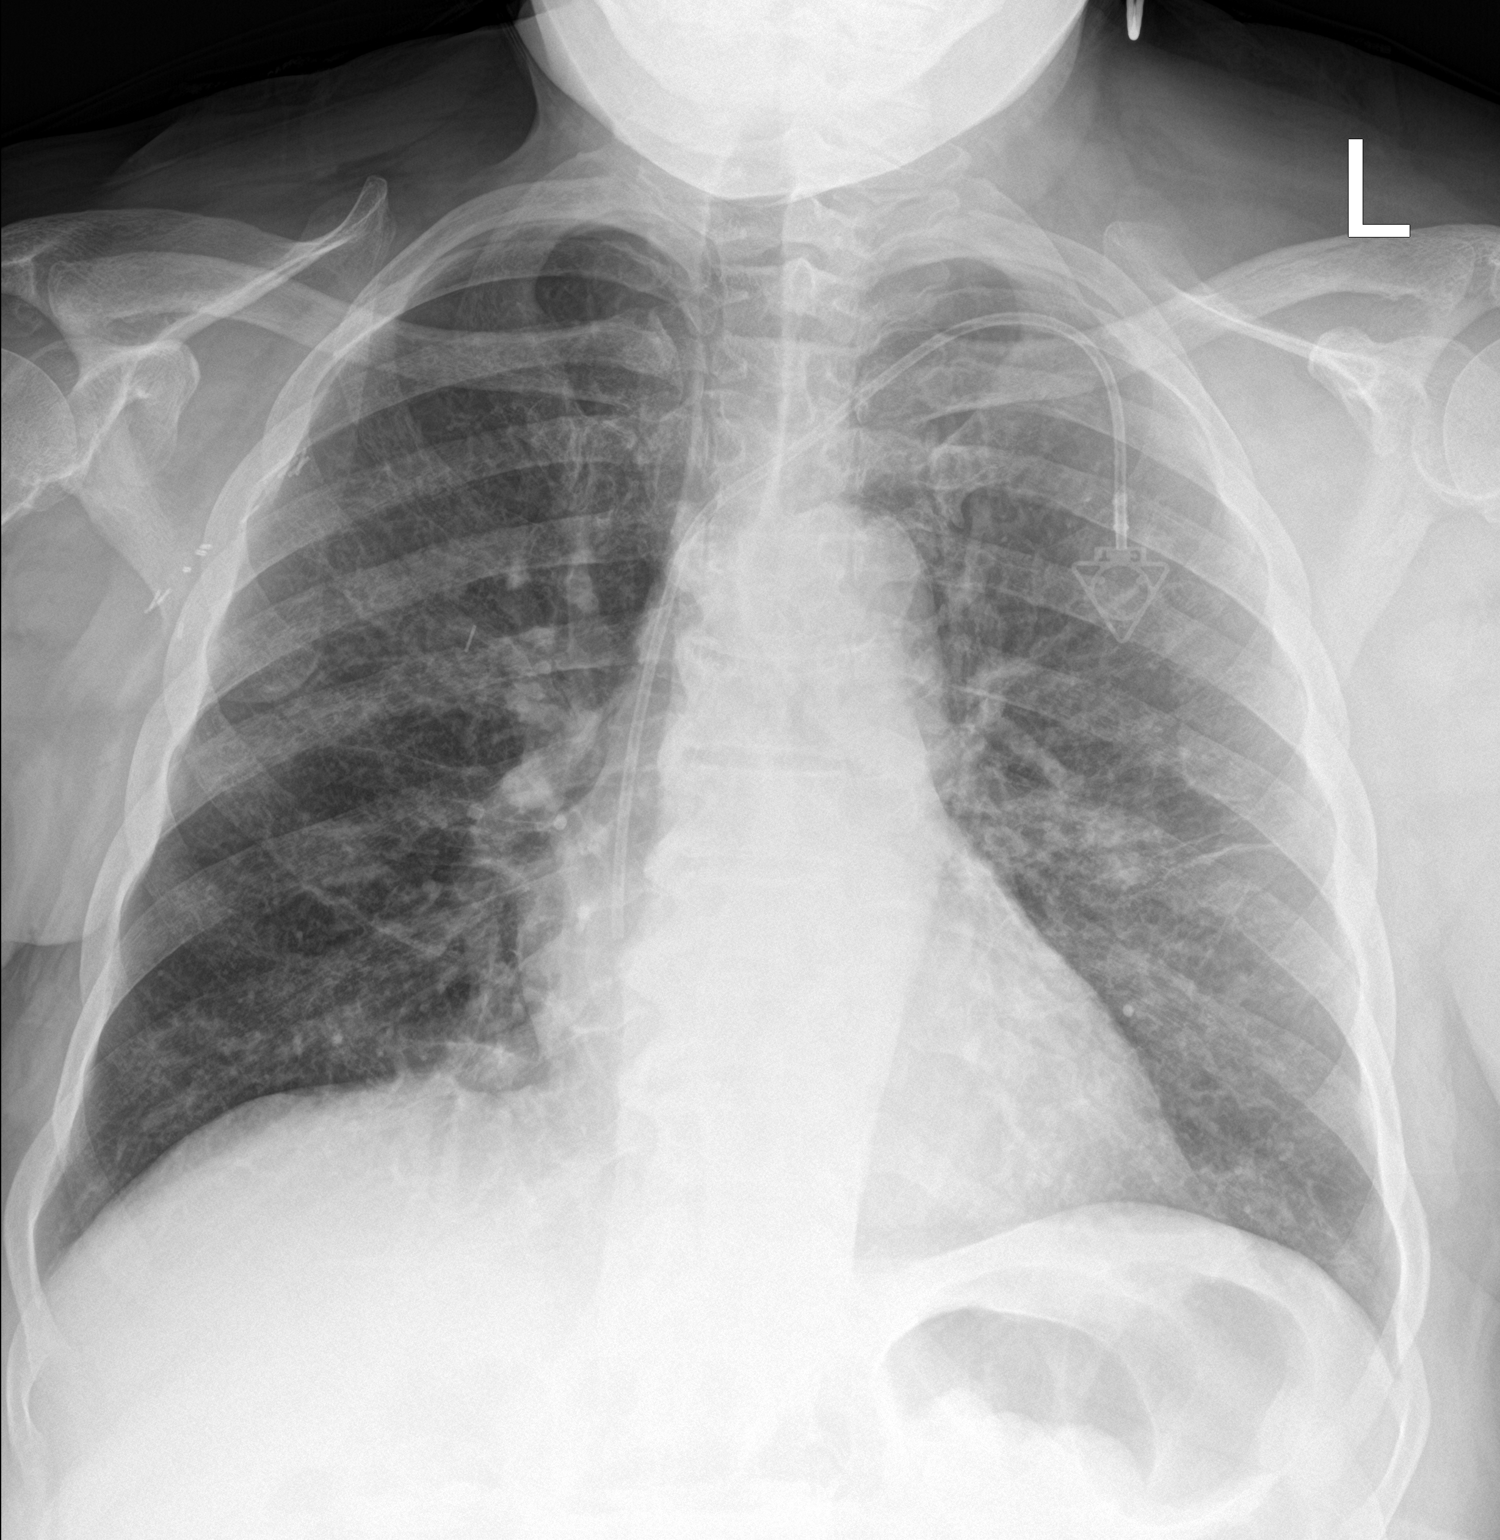

[chest lat]
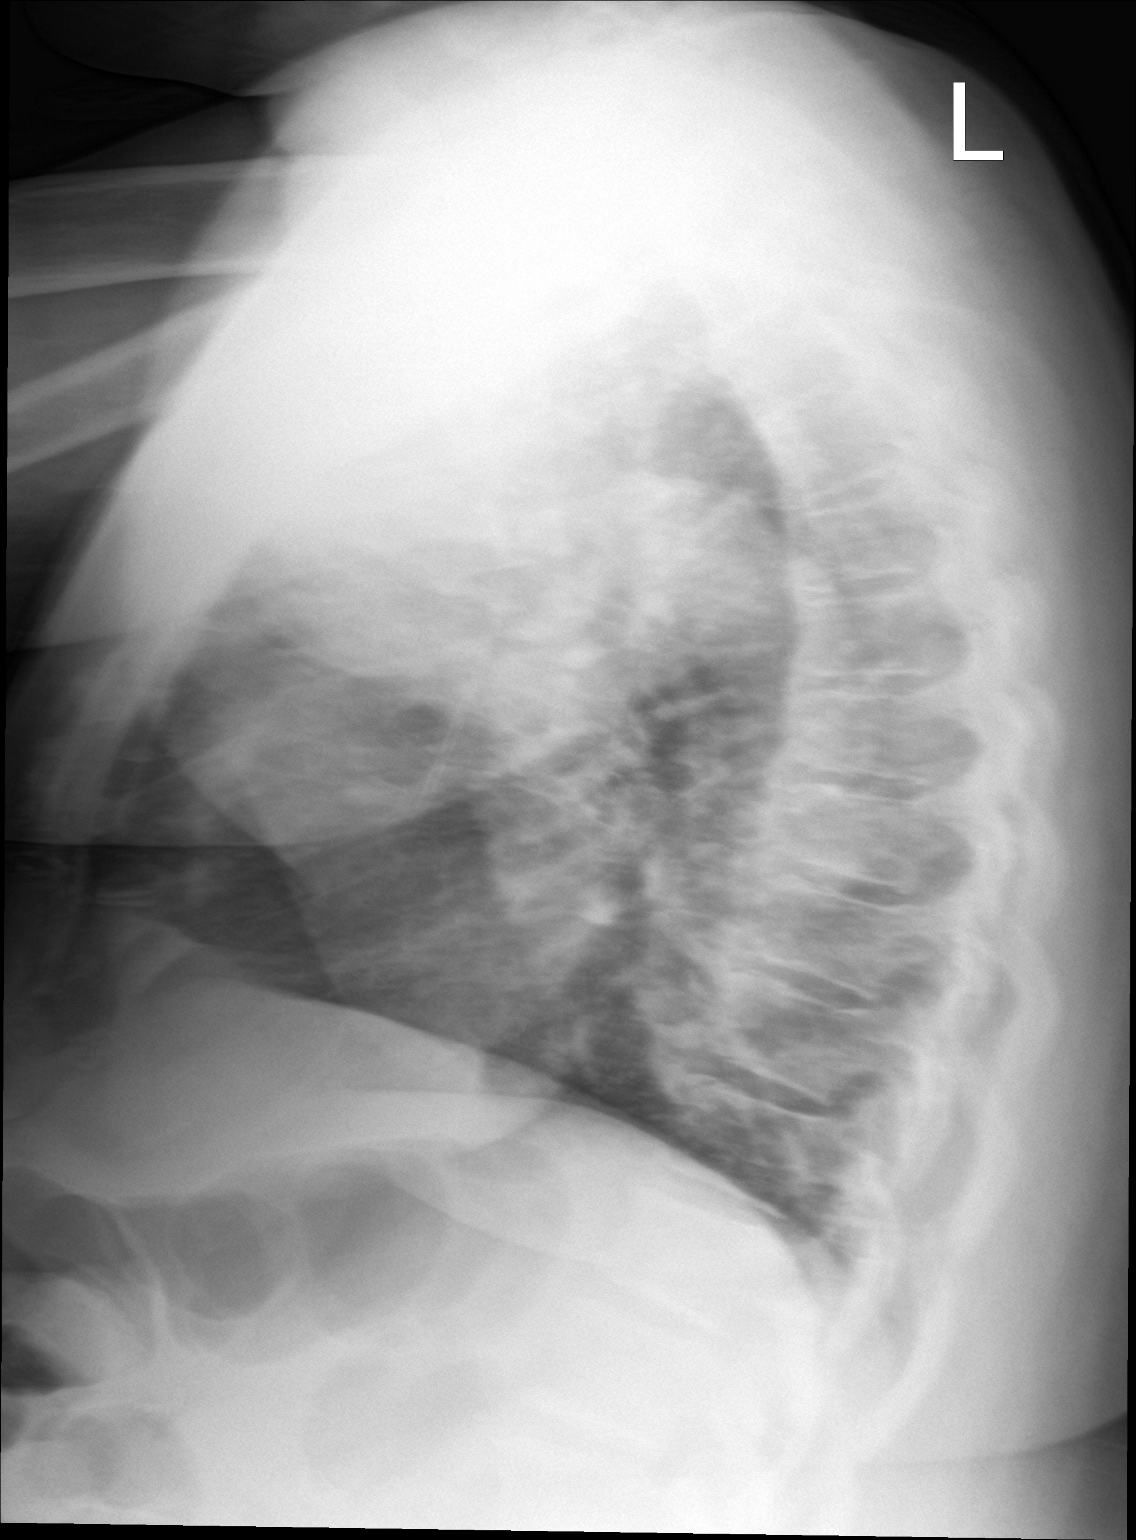

[2 of 2 positions shown; findings below may reference images not displayed]

FINDINGS: Patient's LEFT-sided power port, tip to the superior vena cava.
Heart is UPPER limits normal. There are patchy infiltrates in the
lungs persist, but there has been some improvement in aeration
accounting for differences in technique. No pleural effusions. RIGHT
axillary node dissection. Patient motion artifact.
IMPRESSION: Persistent but improved bilateral infiltrates.

## 2020-06-12 ENCOUNTER — Other Ambulatory Visit: Payer: Self-pay

## 2020-06-12 ENCOUNTER — Ambulatory Visit (INDEPENDENT_AMBULATORY_CARE_PROVIDER_SITE_OTHER): Payer: Medicare Other | Admitting: Family Medicine

## 2020-06-12 ENCOUNTER — Other Ambulatory Visit (HOSPITAL_COMMUNITY)
Admission: RE | Admit: 2020-06-12 | Discharge: 2020-06-12 | Disposition: A | Discharge status: Home / Self Care | Payer: Medicare Other | Source: Ambulatory Visit | Attending: Family Medicine | Admitting: Family Medicine

## 2020-06-12 ENCOUNTER — Encounter: Payer: Self-pay | Admitting: Family Medicine

## 2020-06-12 VITALS — BP 110/68 | HR 75 | Temp 98.0°F | Ht 65.0 in | Wt 164.0 lb

## 2020-06-12 DIAGNOSIS — R350 Frequency of micturition: Secondary | ICD-10-CM | POA: Insufficient documentation

## 2020-06-12 DIAGNOSIS — F1910 Other psychoactive substance abuse, uncomplicated: Secondary | ICD-10-CM

## 2020-06-12 DIAGNOSIS — G4709 Other insomnia: Secondary | ICD-10-CM | POA: Diagnosis not present

## 2020-06-12 LAB — URINALYSIS, ROUTINE W REFLEX MICROSCOPIC
Bilirubin Urine: NEGATIVE
Hgb urine dipstick: NEGATIVE
Ketones, ur: NEGATIVE
Leukocytes,Ua: NEGATIVE
Nitrite: NEGATIVE
Specific Gravity, Urine: 1.01 (ref 1.000–1.030)
Total Protein, Urine: NEGATIVE
Urine Glucose: NEGATIVE
Urobilinogen, UA: 0.2 (ref 0.0–1.0)
pH: 7 (ref 5.0–8.0)

## 2020-06-12 MED ORDER — TRAZODONE HCL 50 MG PO TABS: 25.0000 mg | ORAL_TABLET | Freq: Every evening | ORAL | 1 refills | Status: DC | PRN

## 2020-06-12 NOTE — Patient Instructions (Signed)
Substance Use Disorder Substance use disorder occurs when a person's repeated use of drugs or alcohol interferes with his or her ability to be productive. This disorder can cause problems with mental and physical health. It can affect your ability to have healthy relationships, and it can keep you from being able to meet your responsibilities at work, home, or school. It can also lead to addiction, which is a condition in which the person cannot stop using the substance consistently for a period of time. Addiction changes the way the brain works. Because of these changes, addiction is a chronic condition. Substance use disorder can be mild, moderate, or severe. The most commonly abused substances include:  Alcohol.  Tobacco.  Marijuana.  Stimulants, such as cocaine and methamphetamine.  Hallucinogens, such as LSD and PCP.  Opioids, such as some prescription pain medicines and heroin. What are the causes? This condition may develop due to many complex social, psychological, or physical reasons, such as:  Stress.  Abuse.  Peer pressure.  Anxiety or depression. What increases the risk? This condition is more likely to develop in people who:  Use substances to cope with stress.  Have been abused.  Have a mental health disorder, such as depression.  Have a family history of substance use disorder. What are the signs or symptoms? Symptoms of this condition include:  Using the substance for longer periods of time or at a higher dosage than what is normal or intended.  Having a lasting desire to use the substance.  Being unable to slow down or stop the use of the substance.  Spending an abnormal amount of time getting the substance, using the substance, or recovering from using the substance.  Using the substance in a way that interferes with work, school, social activities, and personal relationships.  Using the substance even after having negative consequences, such  as: ? Health problems. ? Legal or financial troubles. ? Job loss. ? Relationship problems.  Needing more and more of the substance to get the same effect (developing tolerance).  Experiencing unpleasant symptoms if you do not use the substance (withdrawal).  Using the substance to avoid withdrawal symptoms. How is this diagnosed? This condition may be diagnosed based on:  A physical exam.  Your history of substance use.  Your symptoms. This includes: ? How substance use affects your life. ? Changes in personality, behaviors, and mood. ? Having at least two symptoms of substance use disorder within a 8-month period. ? Health issues related to substance use, such as liver damage, shortness of breath, fatigue, cough, or heart problems.  Blood or urine tests to screen for alcohol and drugs. How is this treated? This condition may be treated by:  Stopping substance use safely. This may require taking medicines and being closely monitored for several days.  Taking part in group and individual counseling from mental health providers who help people with substance use disorder.  Staying at a live-in (residential) treatment center for several days or weeks.  Attending daily counseling sessions at a treatment center.  Taking medicine as told by your health care provider: ? To ease symptoms and prevent complications during withdrawal. ? To treat other mental health issues, such as depression or anxiety. ? To block cravings by causing the same effects as the substance. ? To block the effects of the substance or replace good sensations with unpleasant ones.  Participating in a support group to share your experience with others who are going through the same thing. These  groups are an important part of long-term recovery for many people. Recovery can be a long process. Many people who undergo treatment start using the substance again after stopping (relapse). If you relapse, that does  not mean that treatment will not work. Follow these instructions at home:   Take over-the-counter and prescription medicines only as told by your health care provider.  Do not use any drugs or alcohol.  Avoid temptations or triggers that you associate with your use of the substance.  Learn and practice techniques for managing stress.  Have a plan for vulnerable moments. Get phone numbers of people who are willing to help and who are committed to your recovery.  Attend support groups on a regular basis. These groups include 12-step programs like Alcoholics Anonymous and Narcotics Anonymous.  Keep all follow-up visits as told by your health care providers. This is important. This includes continuing to work with therapists and support groups. Contact a health care provider if:  You cannot take your medicines as told.  Your symptoms get worse.  You have trouble resisting the urge to use drugs or alcohol. Get help right away if you:  Relapse.  Think that you may have taken too much of a drug. The hotline of the [facility] is 7472488980.  Have signs of an overdose. Symptoms include: ? Chest pain. ? Confusion. ? Sleepiness or difficulty staying awake. ? Slowed breathing. ? Nausea or vomiting. ? A seizure.  Have serious thoughts about hurting yourself or someone else. Drug overdose is an emergency. Do not wait to see if the symptoms will go away. Get medical help right away. Call your local emergency services (911 in the [place] Do not drive yourself to the hospital. If you ever feel like you may hurt yourself or others, or have thoughts about taking your own life, get help right away. You can go to your nearest emergency department or call:  Your local emergency services (911 in the [place]  A suicide crisis helpline, such as the [facility] at 501-805-8059. This is open 24 hours a day. Summary  Substance use disorder occurs  when a person's repeated use of drugs or alcohol interferes with his or her ability to be productive.  Taking part in group and individual counseling from mental health providers is a common treatment for people with substance use disorder.  Recovery can be a long process. Many people who undergo treatment start using the substance again after stopping (relapse). A relapse does not mean that treatment will not work.  Attend support groups such as Alcoholics Anonymous and Narcotics Anonymous. These groups are an important part of long-term recovery for many people. This information is not intended to replace advice given to you by your health care provider. Make sure you discuss any questions you have with your health care provider. Document Revised: 02/14/2019 Document Reviewed: 12/05/2017 Elsevier Patient Education  2020 [employer].

## 2020-06-12 NOTE — Progress Notes (Signed)
Established Patient Office Visit  Subjective:  Patient ID: [name], female    DOB: 19-Feb-1956  Age: 64 y.o. MRN: [id number]  CC:  Chief Complaint  Patient presents with   Urinary Tract Infection    C/O stomach cramps no dysuria, pains all over.     HPI [name] presents for evaluation of an creased urinary frequency over the last 2 or 3 days.  She denies dysuria urgency fever chills nausea or vomiting.  She has had no vaginal discharge.  Denies constipation diarrhea or blood in your stool.  She has not been sleeping well and requests Seroquel.  Her pain management physician call to let us know that her last urine drug screen was positive for oxycodone, methadone, alprazolam, cocaine and fentanyl.  Past Medical History:  Diagnosis Date   Breast cancer ([place])    DVT (deep venous thrombosis) ([place]) 10/07/2011   Fever 02/06/2018   Hypertension    Radiation 11/29/2005-12/29/2005   right supraclavicular area 4147 cGy   Radiation 06/26/02-08/14/02   right breast 5040 cGy, tumor bed boosted to 1260 cGy   Rheumatic fever     Past Surgical History:  Procedure Laterality Date   BREAST SURGERY     MASTECTOMY Bilateral    PORT A CATH REVISION      Family History  Problem Relation Age of Onset   Pancreatic cancer Mother    Kidney cancer Sister 38   Breast cancer Sister 70   Breast cancer Other 33    Social History   Socioeconomic History   Marital status: Single    Spouse name: Not on file   Number of children: Not on file   Years of education: Not on file   Highest education level: Not on file  Occupational History   Not on file  Tobacco Use   Smoking status: Former Smoker   Smokeless tobacco: Never Used  Scientific laboratory technician Use: Never used  Substance and Sexual Activity   Alcohol use: Never    Comment: Rarely   Drug use: No   Sexual activity: Not on file  Other Topics Concern   Not on file  Social History Narrative   Not on file    Social Determinants of Health   Financial Resource Strain:    Difficulty of Paying Living Expenses:   Food Insecurity:    Worried About Charity fundraiser in the Last Year:    Arboriculturist in the Last Year:   Transportation Needs: Public librarian (Medical): Yes   Lack of Transportation (Non-Medical): Yes  Physical Activity:    Days of Exercise per Week:    Minutes of Exercise per Session:   Stress:    Feeling of Stress :   Social Connections:    Frequency of Communication with Friends and Family:    Frequency of Social Gatherings with Friends and Family:    Attends Religious Services:    Active Member of Clubs or Organizations:    Attends Music therapist:    Marital Status:   Intimate Partner Violence:    Fear of Current or Ex-Partner:    Emotionally Abused:    Physically Abused:    Sexually Abused:     Outpatient Medications Prior to Visit  Medication Sig Dispense Refill   acetaminophen (TYLENOL) 325 MG tablet Take 2 tablets (650 mg total) by mouth every 6 (six) hours as needed for mild pain (or Fever >/=  101).     gabapentin (NEURONTIN) 100 MG capsule Take 1 capsule (300 mg total) by mouth 2 (two) times daily as needed. 60 capsule 3   TUKYSA 150 MG tablet TAKE 2 TABLETS (300 MG TOTAL) BY MOUTH EVERY MORNING. TAKE AS DIRECTED BY MD. 60 tablet 3   allopurinol (ZYLOPRIM) 300 MG tablet Take 300 mg by mouth daily. (Patient not taking: Reported on 06/12/2020)     Ascorbic Acid (VITAMIN C PO) Take 1 tablet by mouth daily. (Patient not taking: Reported on 06/12/2020)     cholecalciferol (VITAMIN D3) 25 MCG (1000 UNIT) tablet Take 1,000 Units by mouth daily. (Patient not taking: Reported on 06/12/2020)     iron polysaccharides (NIFEREX) 150 MG capsule Take 1 capsule (150 mg total) by mouth daily. (Patient not taking: Reported on 06/12/2020) 30 capsule 0   levETIRAcetam (KEPPRA) 500 MG tablet Take 1 tablet (500  mg total) by mouth 2 (two) times daily. (Patient not taking: Reported on 06/12/2020) 60 tablet 2   MAGNESIUM PO Take 1 tablet by mouth daily. (Patient not taking: Reported on 06/12/2020)     methadone (DOLOPHINE) 10 MG tablet Take by mouth 5 (five) times daily as needed. (Patient not taking: Reported on 06/12/2020)     Multiple Vitamin (MULTIVITAMIN) capsule Take 1 capsule by mouth daily.   (Patient not taking: Reported on 06/12/2020)     mupirocin ointment (BACTROBAN) 2 % Place 1 application into the nose 2 (two) times daily. (Patient not taking: Reported on 06/12/2020) 22 g 0   omeprazole (PRILOSEC) 40 MG capsule Take 1 capsule (40 mg total) by mouth daily. (Patient not taking: Reported on 06/12/2020) 30 capsule 1   ondansetron (ZOFRAN) 4 MG tablet Take 1 tablet (4 mg total) by mouth 3 (three) times daily as needed. (Patient not taking: Reported on 06/12/2020) 20 tablet 3   ondansetron (ZOFRAN) 4 MG tablet Take 1 tablet (4 mg total) by mouth every 6 (six) hours as needed for nausea. (Patient not taking: Reported on 06/12/2020) 20 tablet 0   Oxycodone HCl 20 MG TABS Take 0.5 tablets by mouth 3 (three) times daily as needed. (Patient not taking: Reported on 06/12/2020)     Potassium Chloride ER 20 MEQ TBCR Take 1 tablet by mouth daily. (Patient not taking: Reported on 06/12/2020)     prochlorperazine (COMPAZINE) 10 MG tablet Take 1 tablet (10 mg total) by mouth every 6 (six) hours as needed for nausea or vomiting. (Patient not taking: Reported on 06/12/2020) 30 tablet 1   sertraline (ZOLOFT) 100 MG tablet Take 1 tablet (100 mg total) by mouth daily. (Patient not taking: Reported on 06/12/2020) 90 tablet 2   torsemide (DEMADEX) 20 MG tablet Take 10 mg by mouth daily. (Patient not taking: Reported on 06/12/2020)     triamterene-hydrochlorothiazide (DYAZIDE) 37.5-25 MG capsule Take 1 capsule by mouth daily. (Patient not taking: Reported on 06/12/2020)     Facility-Administered Medications Prior to Visit  Medication  Dose Route Frequency Provider Last Rate Last Admin   sodium chloride 0.9 % injection 10 mL  10 mL Intravenous PRN [name], [name], MD   10 mL at 03/04/14 1421   sodium chloride flush (NS) 0.9 % injection 10 mL  10 mL Intracatheter PRN [name], [name], MD   10 mL at 07/30/19 1627    Allergies  Allergen Reactions   Tramadol Nausea Only   Hydrocodone-Acetaminophen Itching and Rash   Pork-Derived Products Other (See Comments)    Pt says she just  doesn't eat pork; has no reaction to pork products    ROS Review of Systems  Constitutional: Negative.   HENT: Negative.   Respiratory: Negative.   Cardiovascular: Negative.   Gastrointestinal: Negative.   Endocrine: Negative for polyphagia and polyuria.  Genitourinary: Positive for frequency. Negative for difficulty urinating, dysuria, flank pain, hematuria, urgency, vaginal bleeding, vaginal discharge and vaginal pain.  Musculoskeletal: Negative for gait problem and joint swelling.  Skin: Negative for pallor and rash.  Neurological: Negative for weakness and numbness.  Hematological: Does not bruise/bleed easily.  Psychiatric/Behavioral: Positive for sleep disturbance.      Objective:    Physical Exam Constitutional:      General: She is not in acute distress.    Appearance: Normal appearance. She is not ill-appearing or toxic-appearing.  HENT:     Head: Normocephalic and atraumatic. No Battle's sign.  Eyes:     Extraocular Movements: Extraocular movements intact.     Conjunctiva/sclera: Conjunctivae normal.     Right eye: Right conjunctiva is not injected.     Left eye: Left conjunctiva is not injected.  Neck:     Thyroid: No thyroid mass, thyromegaly or thyroid tenderness.     Trachea: Trachea and phonation normal.  Cardiovascular:     Rate and Rhythm: Normal rate and regular rhythm.  Pulmonary:     Effort: Pulmonary effort is normal.     Breath sounds: Normal breath sounds.  Abdominal:     General: Bowel sounds  are normal.     Tenderness: There is no abdominal tenderness. There is no right CVA tenderness or left CVA tenderness.  Musculoskeletal:     Cervical back: Normal range of motion.  Lymphadenopathy:     Cervical:     Right cervical: No superficial cervical adenopathy.    Left cervical: No superficial cervical adenopathy.  Neurological:     Mental Status: She is alert.     BP 110/68    Pulse 75    Temp 98 F (36.7 C) (Tympanic)    Ht _0  (1.651 m)    Wt 164 lb (74.4 kg)    SpO2 98%    BMI 27.29 kg/m  Wt Readings from Last 3 Encounters:  06/12/20 164 lb (74.4 kg)  05/27/20 183 lb (83 kg)  04/01/20 202 lb 2 oz (91.7 kg)     Health Maintenance Due  Topic Date Due   COVID-19 Vaccine (1) Never done   PAP SMEAR-Modifier  Never done   Fecal DNA (Cologuard)  Never done   DEXA SCAN  Never done   PNA vac Low Risk Adult (1 of 2 - PCV13) 05/19/2020   INFLUENZA VACCINE  06/07/2020    There are no preventive care reminders to display for this patient.  Lab Results  Component Value Date   TSH 0.348 (L) 02/11/2020   Lab Results  Component Value Date   WBC 7.7 05/27/2020   HGB 10.4 (L) 05/27/2020   HCT 33.7 (L) 05/27/2020   MCV 84.9 05/27/2020   PLT 308 05/27/2020   Lab Results  Component Value Date   NA 141 05/27/2020   K 3.9 05/27/2020   CHLORIDE 101 11/09/2017   CO2 29 05/27/2020   GLUCOSE 97 05/27/2020   BUN 12 05/27/2020   CREATININE 1.10 (H) 05/27/2020   BILITOT 0.2 (L) 05/27/2020   ALKPHOS 116 05/27/2020   AST 15 05/27/2020   ALT 7 05/27/2020   PROT 7.1 05/27/2020   ALBUMIN 3.3 (L) 05/27/2020   CALCIUM  10.4 (H) 05/27/2020   ANIONGAP 11 05/27/2020   EGFR >60 11/09/2017   No results found for: CHOL No results found for: HDL No results found for: LDLCALC No results found for: TRIG No results found for: CHOLHDL Lab Results  Component Value Date   HGBA1C 5.6 06/12/2018      Assessment & Plan:   Problem List Items Addressed This Visit      Other    Substance abuse ([place]) - Primary   Relevant Orders   HIV antibody (with reflex)   Urinary frequency   Relevant Orders   Urinalysis, Routine w reflex microscopic   Urine Culture   Urine cytology ancillary only   Other insomnia   Relevant Medications   traZODone (DESYREL) 50 MG tablet      Meds ordered this encounter  Medications   traZODone (DESYREL) 50 MG tablet    Sig: Take 0.5-1 tablets (25-50 mg total) by mouth at bedtime as needed for sleep.    Dispense:  30 tablet    Refill:  1    Follow-up: Return in about 3 months (around 09/12/2020), or if symptoms worsen or fail to improve.   Pending treatment depending on results of above studies.  Confronted the patient about her recent positive urine drug screen.  She was given information on substance abuse.  Trazodone was offered for sleep.  Libby Maw, MD

## 2020-06-13 LAB — URINE CULTURE
MICRO NUMBER:: 10797025
Result:: NO GROWTH
SPECIMEN QUALITY:: ADEQUATE

## 2020-06-13 LAB — HIV ANTIBODY (ROUTINE TESTING W REFLEX): HIV 1&2 Ab, 4th Generation: NONREACTIVE

## 2020-06-15 LAB — URINE CYTOLOGY ANCILLARY ONLY
Bacterial Vaginitis-Urine: NEGATIVE
Candida Urine: NEGATIVE
Chlamydia: NEGATIVE
Comment: NEGATIVE

## 2020-06-15 IMAGING — MR MR HEAD WO/W CM
14 series · 48 of 48 positions shown · IV contrast (gadavist)
Comparison: Head CT 01/16/2020 and MRI 09/04/2019

CLINICAL DATA: Follow-up breast cancer with brain metastases.
Status post radiation therapy 11-12/5454.

EXAM:
MRI HEAD WITHOUT AND WITH CONTRAST
TECHNIQUE: Multiplanar, multiecho pulse sequences of the brain and surrounding
structures were obtained without and with intravenous contrast.
CONTRAST:  7.5mL GADAVIST GADOBUTROL 1 MMOL/ML IV SOLN

[Series 5: T2 · sagittal · 5.0mm · 0.47mm/px · 1 of 24 slices shown (1 of 2)]
[im 1/24]
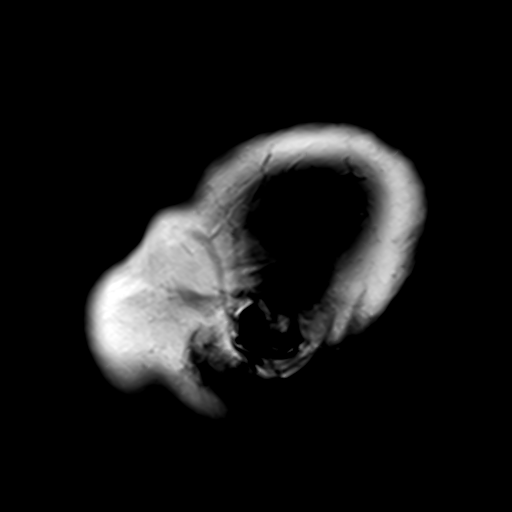

[Series 6: T2 · axial · 5.0mm · 0.45mm/px · 1 of 25 slices shown (2 of 2)]
[im 1/25]
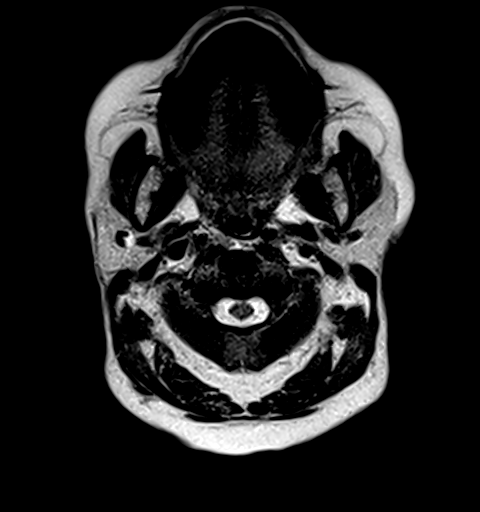

[Series 7: dwi_tracew · axial · 3.0mm · 1.08mm/px · z∈[-2,+132]mm · 6 of 92 slices shown]
[im 1/92]
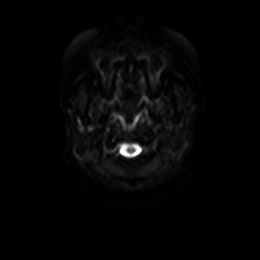
[im 19/92]
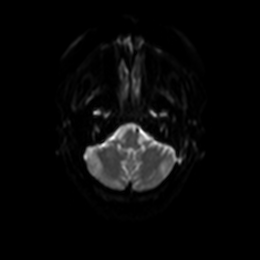
[im 37/92]
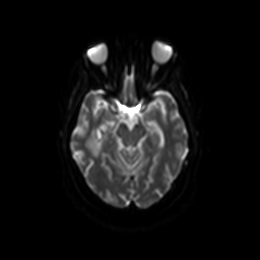
[im 55/92]
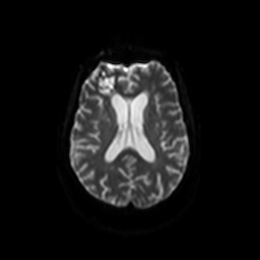
[im 73/92]
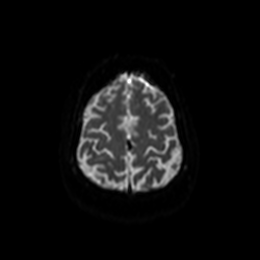
[im 92/92]
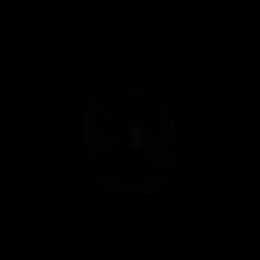

[Series 8: dwi_adc · axial · 3.0mm · 1.08mm/px · z∈[-2,+132]mm · 3 of 46 slices shown]
[im 1/46]
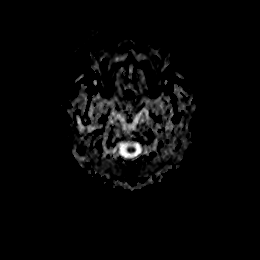
[im 23/46]
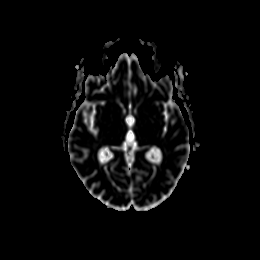
[im 46/46]
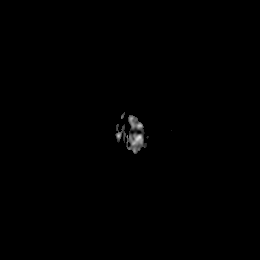

[Series 9: T1 · axial · 1.0mm · 0.94mm/px · z∈[-9,+133]mm · 9 of 144 slices shown]
[im 1/144]
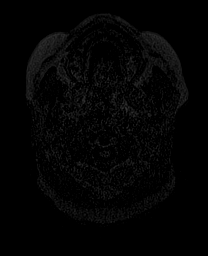
[im 18/144]
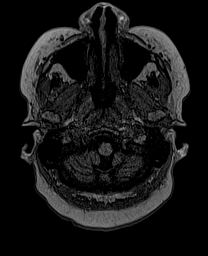
[im 36/144]
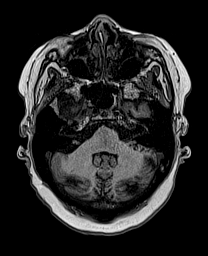
[im 54/144]
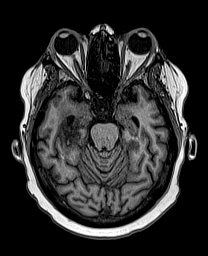
[im 72/144]
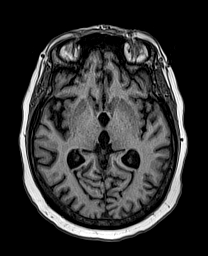
[im 90/144]
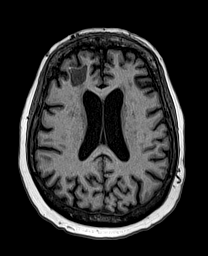
[im 108/144]
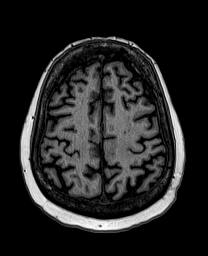
[im 126/144]
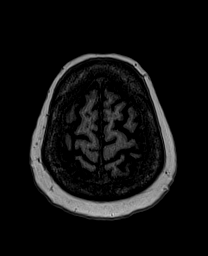
[im 144/144]
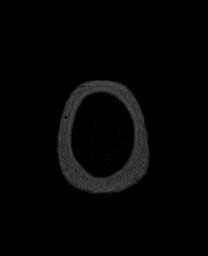

[Series 10: mip_images(sw) · axial · 24.0mm · 0.75mm/px · z∈[-3,+128]mm · 3 of 45 slices shown]
[im 1/45]
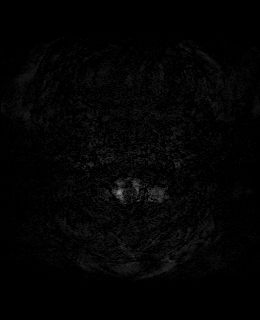
[im 23/45]
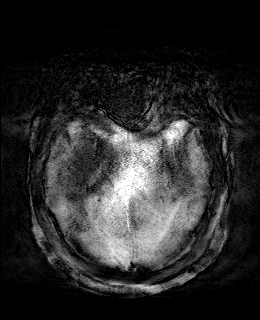
[im 45/45]
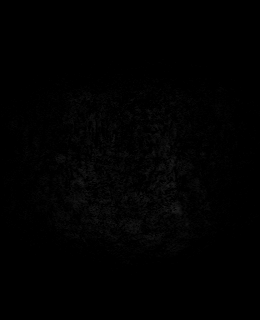

[Series 11: swi_images · axial · 3.0mm · 0.75mm/px · z∈[-14,+138]mm · 3 of 52 slices shown]
[im 1/52]
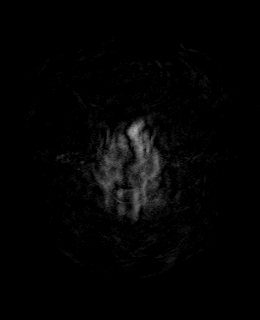
[im 26/52]
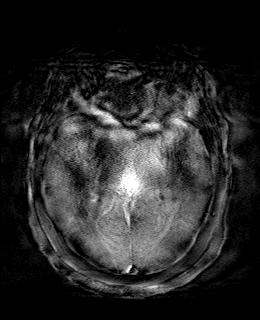
[im 52/52]
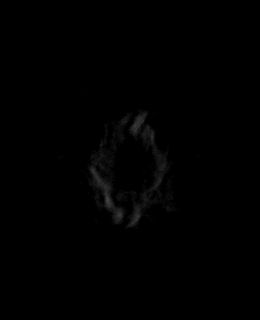

[Series 12: FLAIR · axial · 3.0mm · 0.86mm/px · z∈[-9,+125]mm · 3 of 46 slices shown]
[im 1/46]
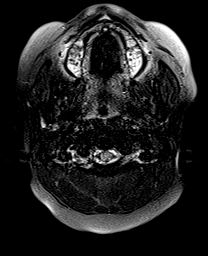
[im 23/46]
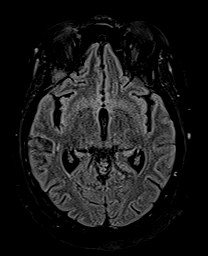
[im 46/46]
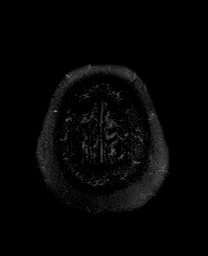

[Series 13: DWI · coronal · 5.0mm · 1.31mm/px · 3 of 56 slices shown (1 of 2)]
[im 1/56]
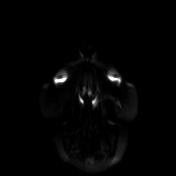
[im 28/56]
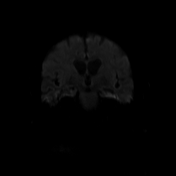
[im 56/56]
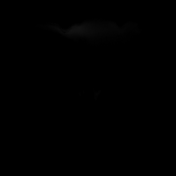

[Series 14: DWI · coronal · 5.0mm · 1.31mm/px · 2 of 28 slices shown (2 of 2)]
[im 1/28]
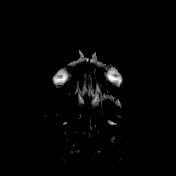
[im 28/28]
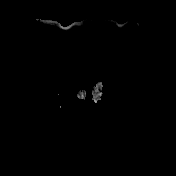

[Series 15: T2 post-contrast · coronal · 5.0mm · 0.86mm/px · 2 of 28 slices shown]
[im 1/28]
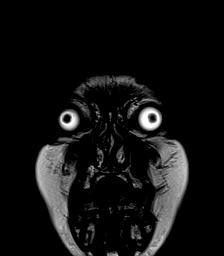
[im 28/28]
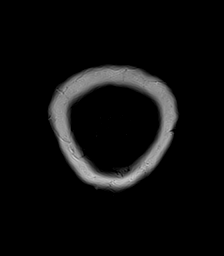

[Series 16: T1 post-contrast · axial · 1.0mm · 0.94mm/px · z∈[-9,+133]mm · 9 of 144 slices shown (1 of 3)]
[im 1/144]
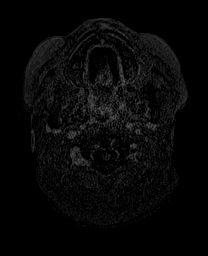
[im 18/144]
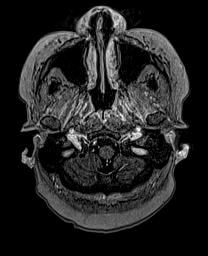
[im 36/144]
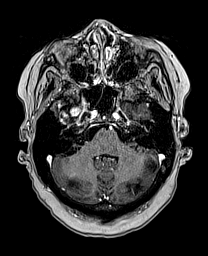
[im 54/144]
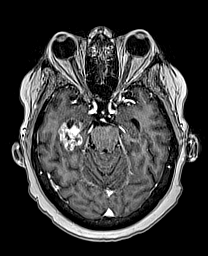
[im 72/144]
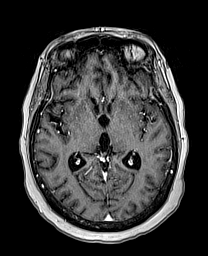
[im 90/144]
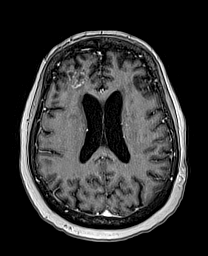
[im 108/144]
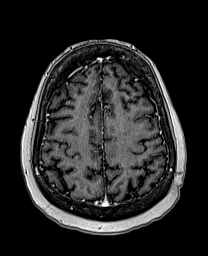
[im 126/144]
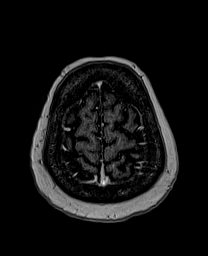
[im 144/144]
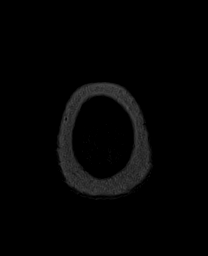

[Series 17: T1 post-contrast · coronal · 5.0mm · 0.43mm/px · 2 of 28 slices shown (2 of 3)]
[im 1/28]
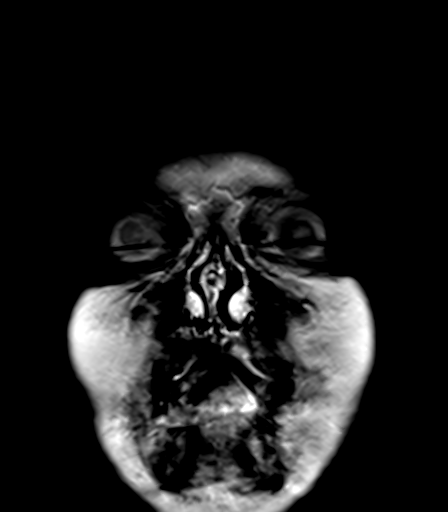
[im 28/28]
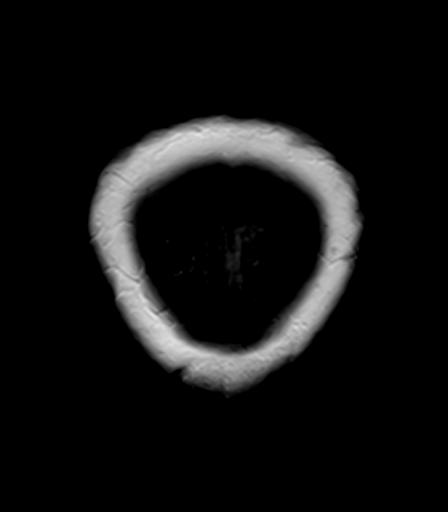

[Series 18: T1 post-contrast · sagittal · 5.0mm · 0.94mm/px · 1 of 24 slices shown (3 of 3)]
[im 1/24]
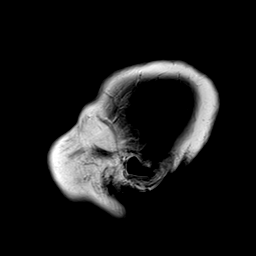

[48 of 48 positions shown; findings below may reference images not displayed]

FINDINGS: The study is mildly motion degraded.

Brain: A 2.8 x 2.0 x 1.9 cm (AP x transverse x craniocaudal)
heterogeneously enhancing right frontal mass and a 4.9 x 2.4 x
cm right temporal mass have both decreased in size from the prior
MRI with both appearing partially cystic. There is persistent mild
edema surrounding the right temporal lesion, while right frontal
lobe edema has resolved. No new enhancing lesion is identified.
There is no acute infarct, midline shift, or extra-axial fluid
collection. Small foci of T2 hyperintensity in the cerebral white
matter bilaterally have mildly increased and are nonspecific but
compatible with mild chronic small vessel ischemic disease. There is
a mildly expanded partially empty sella. There is mild cerebral
atrophy.

Vascular: Major intracranial vascular flow voids are preserved.

Skull and upper cervical spine: No destructive skull lesion.
Diffusely diminished bone marrow T1 signal intensity in the included
cervical spine, nonspecific but may be related to the patient's
known anemia.

Sinuses/Orbits: Unremarkable orbits. Clear paranasal sinuses. Small
left mastoid effusion.

Other: None.
IMPRESSION: 1. Decreased size of right frontal and right temporal lobe
metastases. Mild residual right temporal edema without significant
mass effect.
2. No evidence of new intracranial metastases or acute abnormality.

## 2020-06-22 ENCOUNTER — Other Ambulatory Visit: Payer: Self-pay

## 2020-06-22 DIAGNOSIS — C50911 Malignant neoplasm of unspecified site of right female breast: Secondary | ICD-10-CM

## 2020-06-24 ENCOUNTER — Inpatient Hospital Stay: Payer: Medicare Other | Admitting: Oncology

## 2020-06-24 ENCOUNTER — Inpatient Hospital Stay: Payer: Medicare Other

## 2020-06-25 ENCOUNTER — Other Ambulatory Visit: Payer: Self-pay

## 2020-06-25 IMAGING — CT CT HEAD W/O CM
3 series · 15 of 47 positions shown, 18 images · non-contrast
Comparison: Head CT dated 01/16/2020.

CLINICAL DATA: 63-year-old female with headache. History of
metastatic breast cancer.

EXAM:
CT HEAD WITHOUT CONTRAST
TECHNIQUE: Contiguous axial images were obtained from the base of the skull
through the vertex without intravenous contrast.

[Series 2: head wo · axial · 0.47mm/px · z∈[-110,+25]mm · 9 of 33 slices shown, 12 images]
[im 3/33  brain]
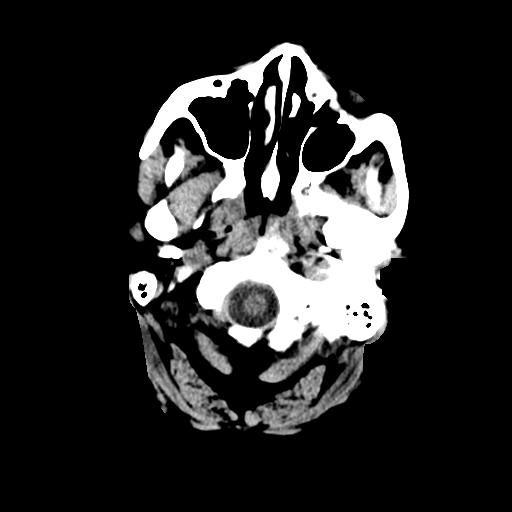
[im 3/33  bone]
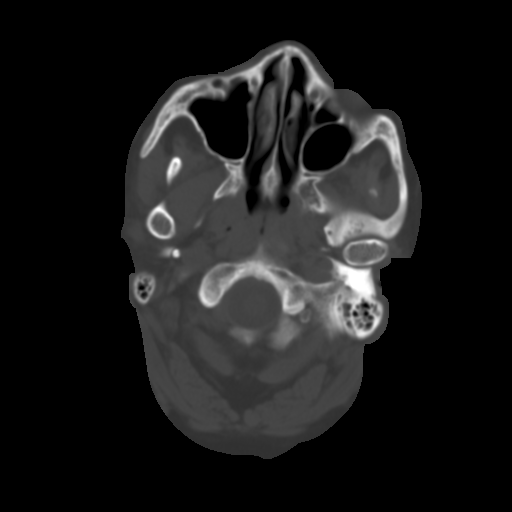
[im 6/33  brain]
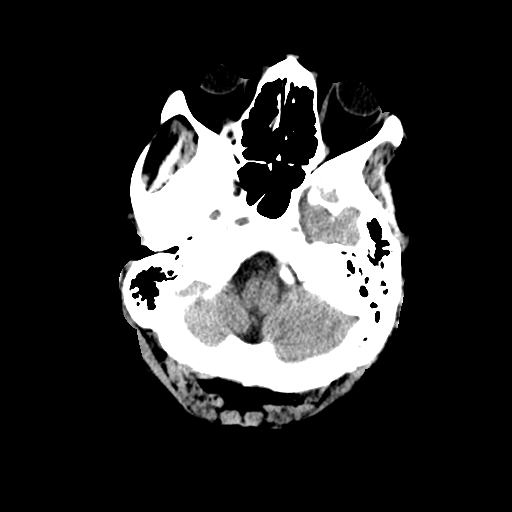
[im 9/33  brain]
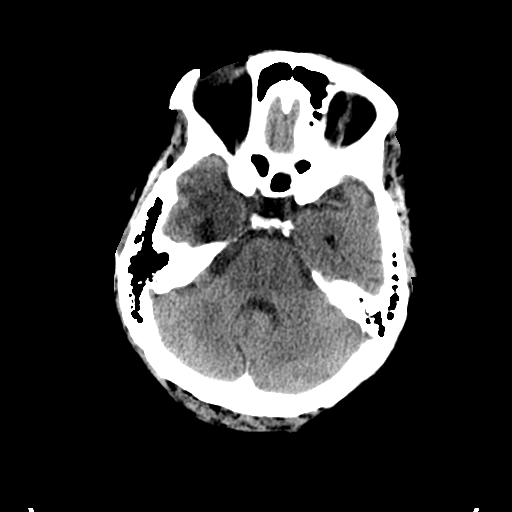
[im 13/33  brain]
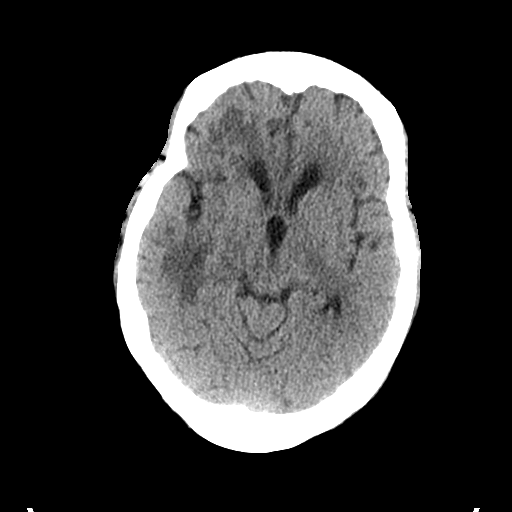
[im 17/33  brain]
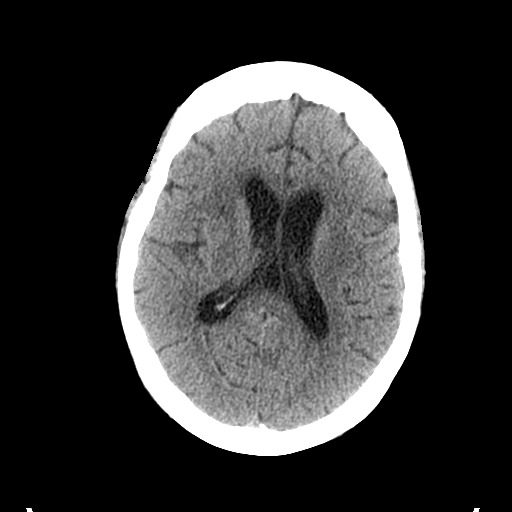
[im 17/33  bone]
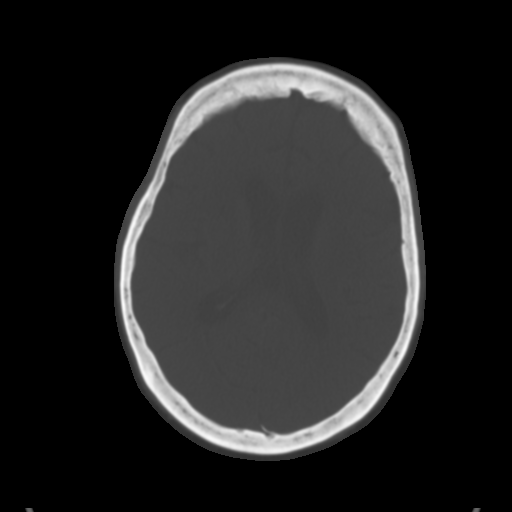
[im 20/33  brain]
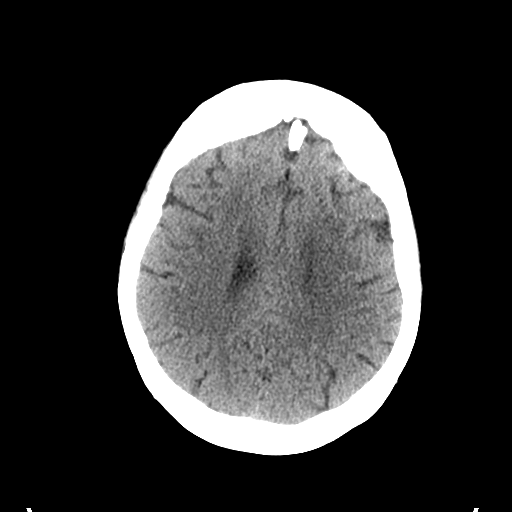
[im 24/33  brain]
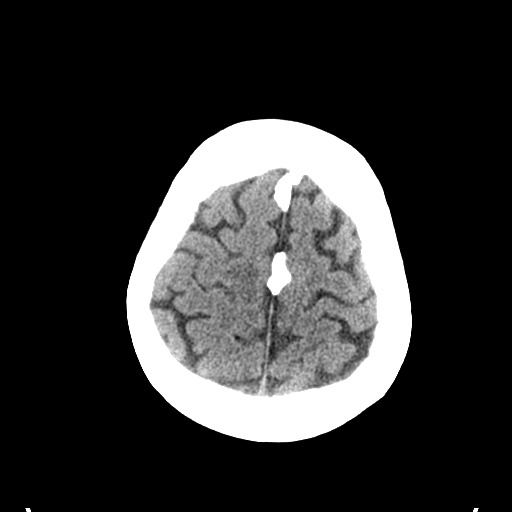
[im 27/33  brain]
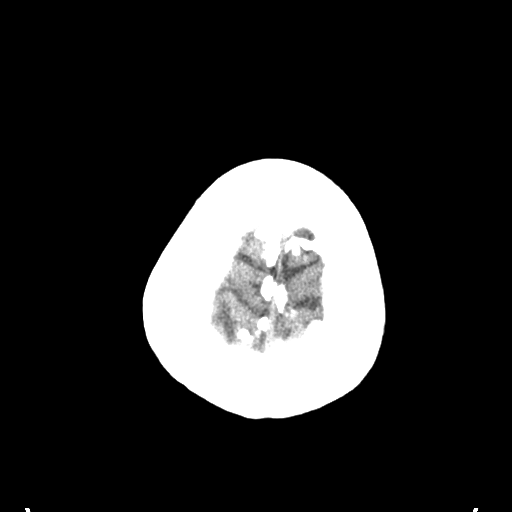
[im 30/33  brain]
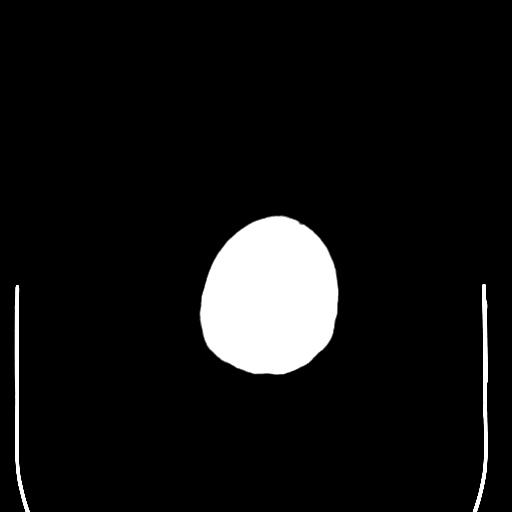
[im 30/33  bone]
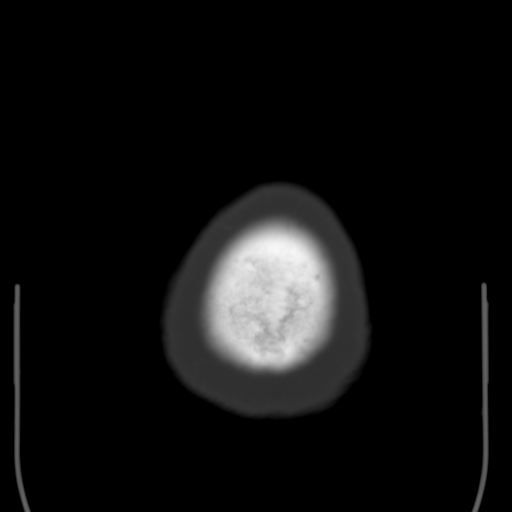

[Series 5: coronal soft tissue · coronal · 0.37mm/px · 3 of 82 slices shown]
[im 28/82  brain]
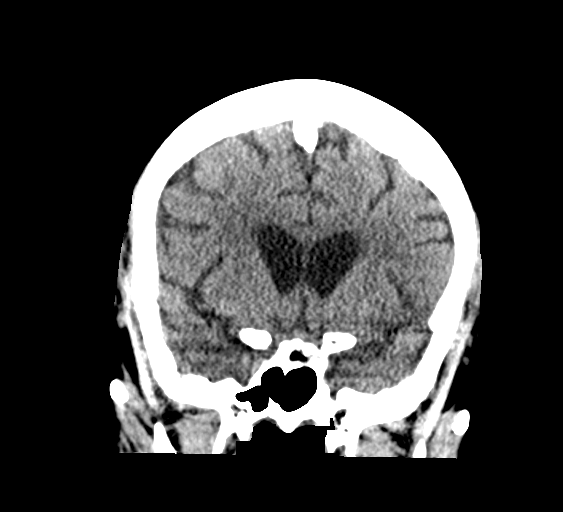
[im 37/82  brain]
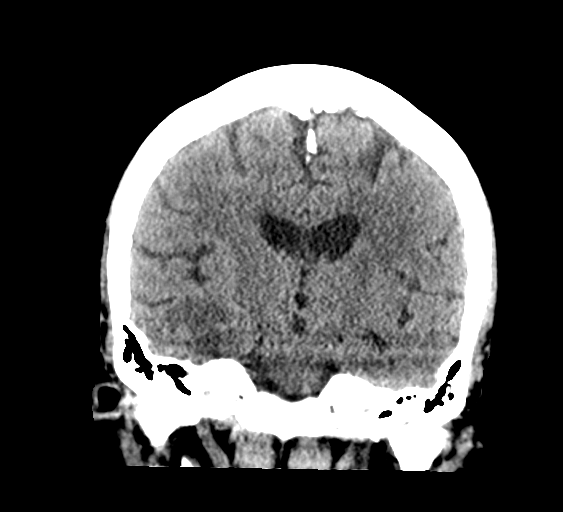
[im 46/82  brain]
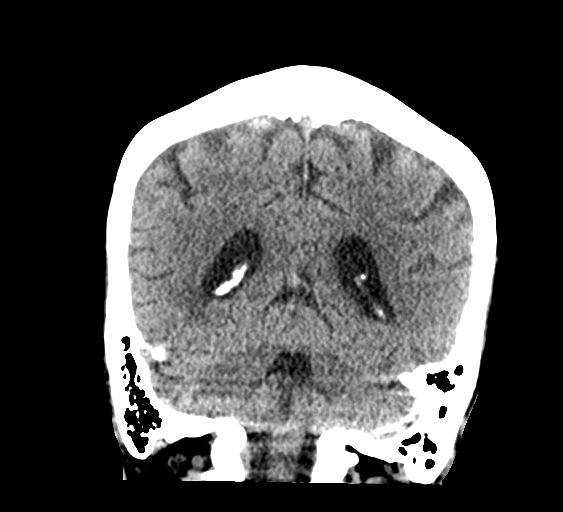

[Series 6: sagittal soft tissue · sagittal · 0.36mm/px · 3 of 82 slices shown]
[im 28/82  brain]
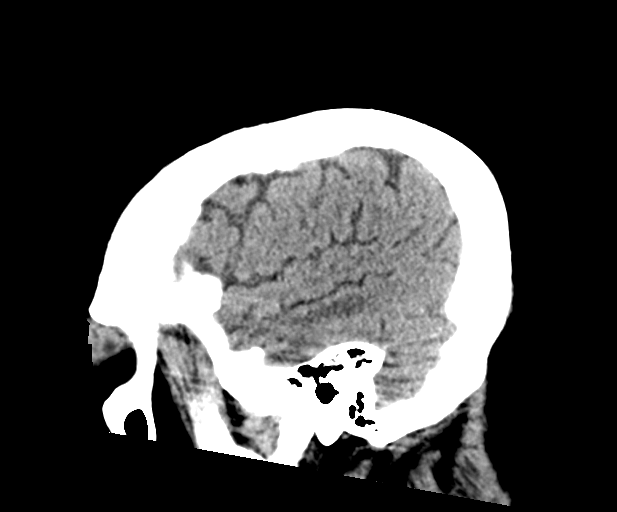
[im 41/82  brain]
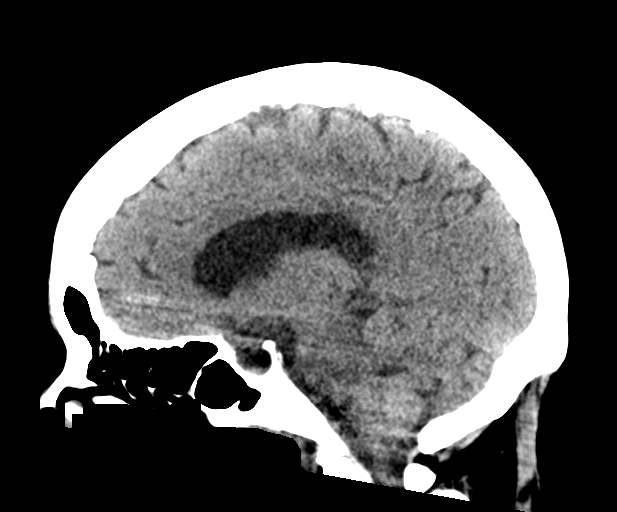
[im 55/82  brain]
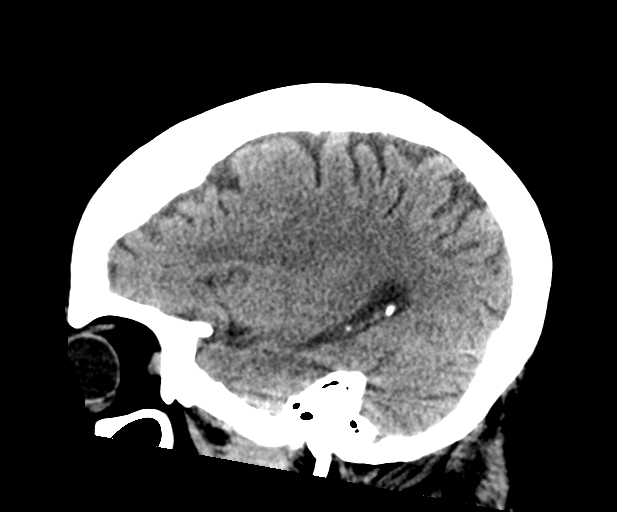

[15 of 47 positions shown; findings below may reference images not displayed]

FINDINGS: Brain: The ventricles and sulci appropriate size for patient's age.
Areas of white matter hypodensity involving the inferior right
frontal and right temporal lobes similar to prior CT in keeping with
metastatic disease. There is no acute intracranial hemorrhage. No
mass effect or midline shift. No extra-axial fluid collection.

Vascular: No hyperdense vessel or unexpected calcification.

Skull: Normal. Negative for fracture or focal lesion.

Sinuses/Orbits: The visualized paranasal sinuses and the right
mastoid air cells are clear. Mild left mastoid effusions. No
air-fluid level.

Other: None
IMPRESSION: 1. No acute intracranial hemorrhage.
2. Similar appearance of right frontal and temporal white matter
hypodensity in keeping with metastatic disease.

## 2020-06-25 IMAGING — CR DG CHEST 2V
2 series · 2 of 2 positions shown · non-contrast
Comparison: January 25, 2020

CLINICAL DATA: Altered mental status.

EXAM:
CHEST - 2 VIEW

[w chest lat]
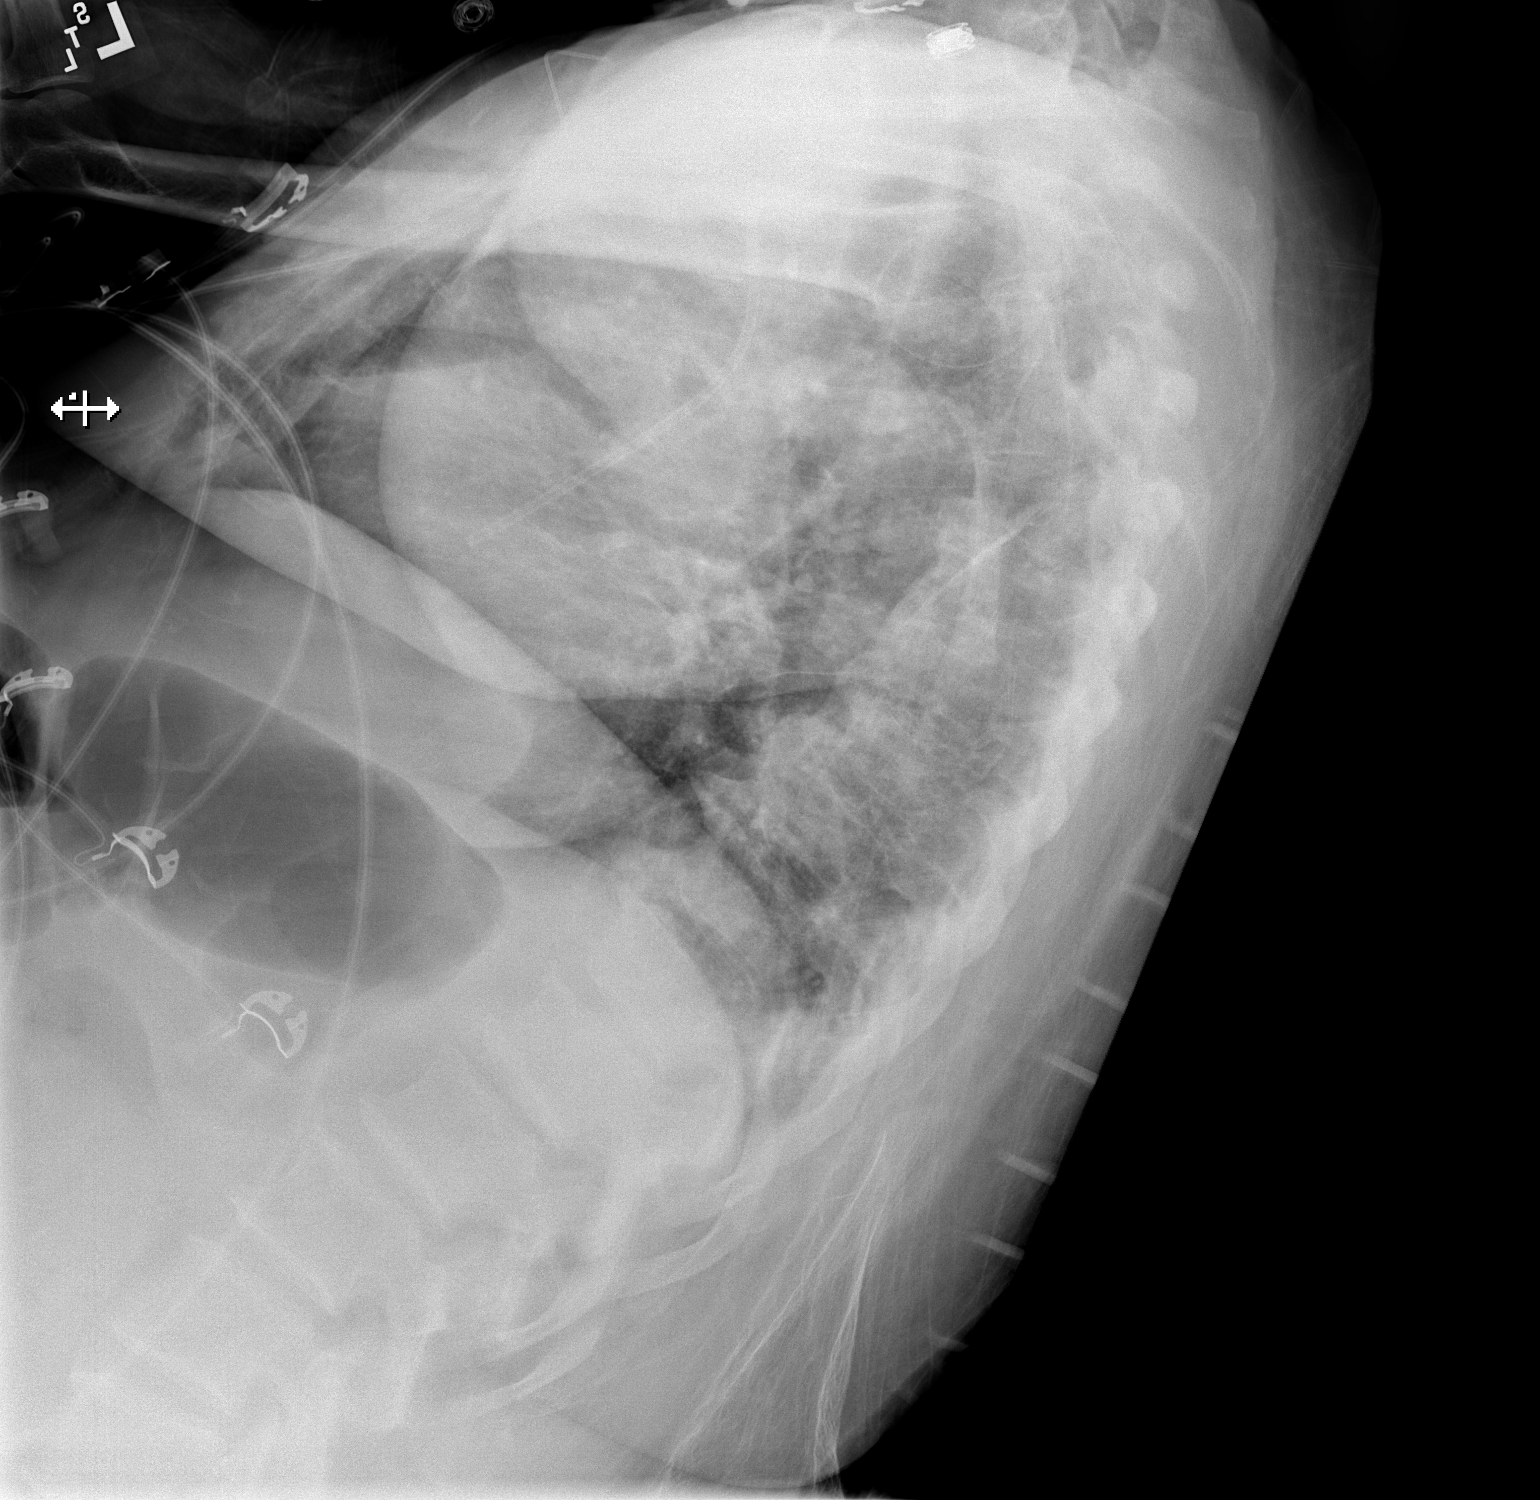

[x chest ap]
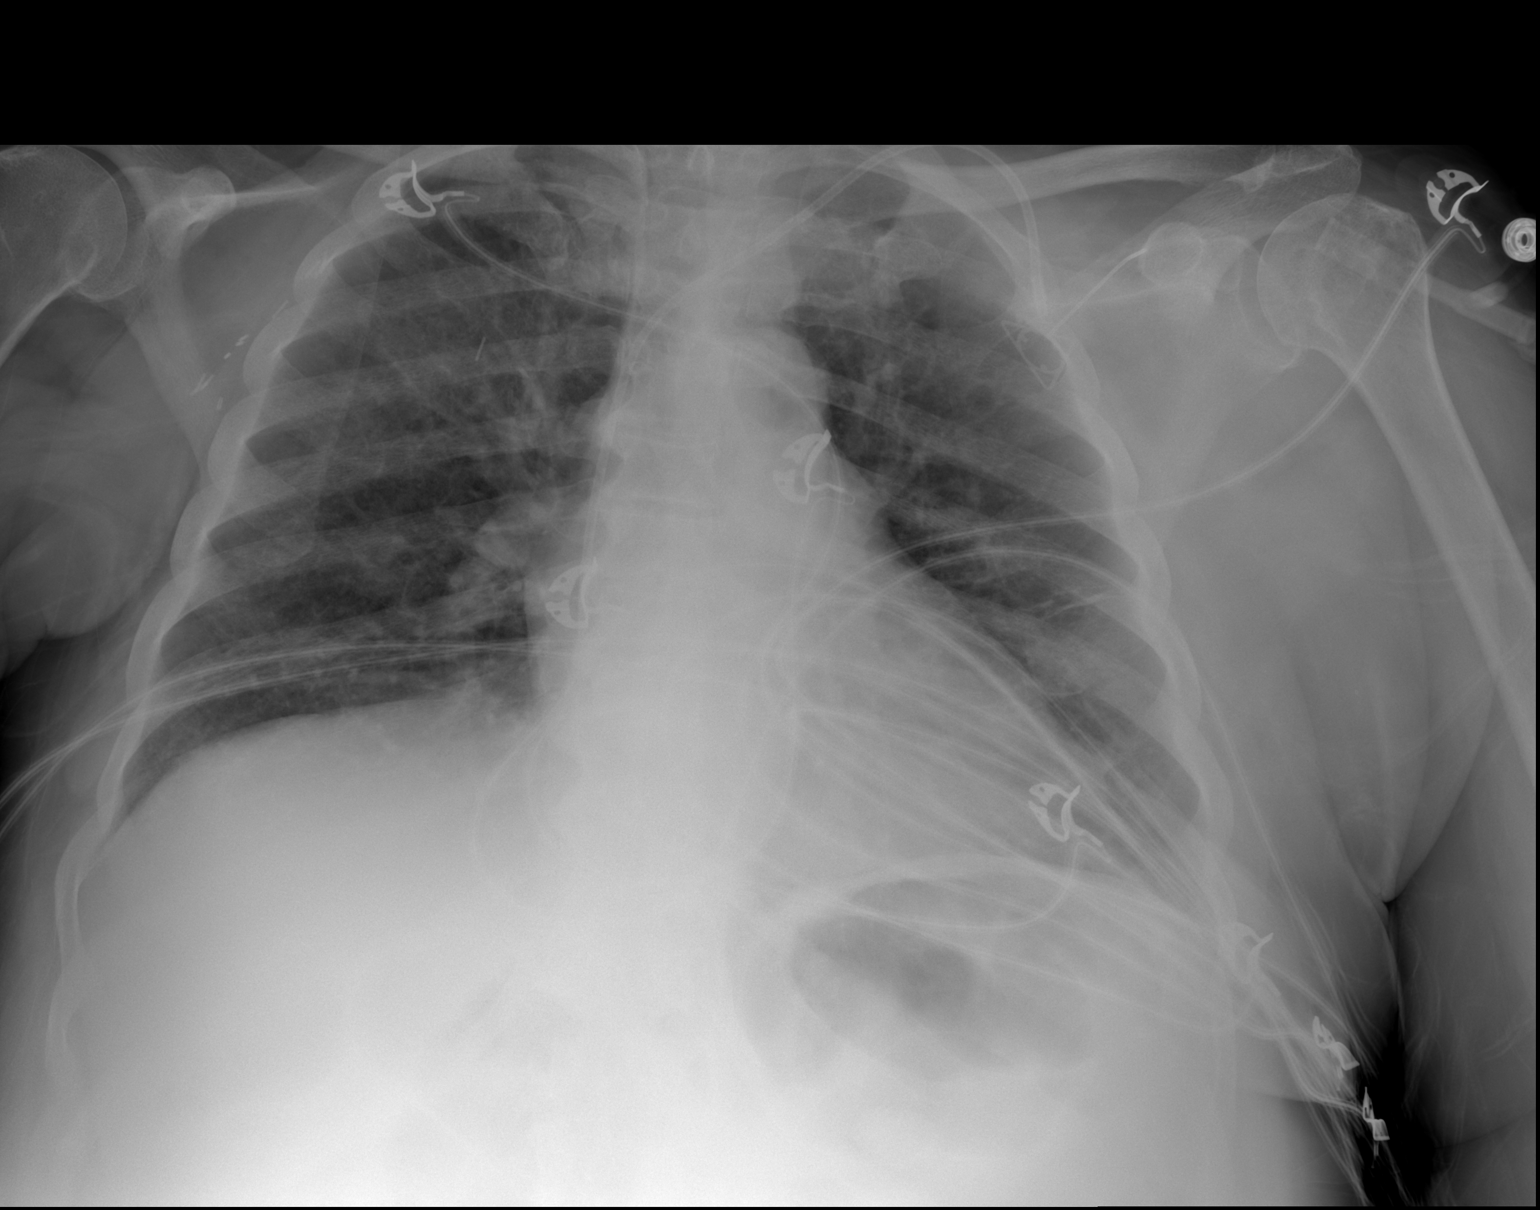

[2 of 2 positions shown; findings below may reference images not displayed]

FINDINGS: There is a well-positioned left-sided Port-A-Cath. The heart size is
stable. Areas of scarring and atelectasis are noted bilaterally.
There is no pneumothorax. There are degenerative changes throughout
the visualized thoracic spine.
IMPRESSION: No active cardiopulmonary disease.

## 2020-06-26 ENCOUNTER — Ambulatory Visit (INDEPENDENT_AMBULATORY_CARE_PROVIDER_SITE_OTHER): Payer: Medicare Other | Admitting: Family Medicine

## 2020-06-26 ENCOUNTER — Encounter: Payer: Self-pay | Admitting: Family Medicine

## 2020-06-26 VITALS — BP 108/64 | HR 72 | Temp 98.3°F | Ht 65.0 in | Wt 168.0 lb

## 2020-06-26 DIAGNOSIS — F418 Other specified anxiety disorders: Secondary | ICD-10-CM | POA: Insufficient documentation

## 2020-06-26 DIAGNOSIS — F1910 Other psychoactive substance abuse, uncomplicated: Secondary | ICD-10-CM

## 2020-06-26 IMAGING — MR MR THORACIC SPINE W/O CM
6 series · 34 of 48 positions shown · IV contrast (agent unspecified)
Comparison: CT cervical spine 11/19/2019. 08/20/2019 PET-CT.
05/03/2018 chest CT.

CLINICAL DATA: 63-year-old female with metastatic breast cancer.
Back pain.

A total spine MRI without and with contrast was planned, but the
patient was only able to tolerate a noncontrast thoracic exam due to
pain.
EXAM:
MRI THORACIC SPINE WITHOUT CONTRAST
TECHNIQUE: Multiplanar, multisequence MR imaging of the thoracic spine was
performed. No intravenous contrast was administered.

[Series 16: T1 · sagittal · 4.0mm · 1.72mm/px · 1 of 5 slices shown (1 of 2)]
[im 1/5]
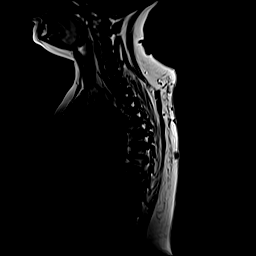

[Series 17: STIR · sagittal · 3.0mm · 1.00mm/px · 5 of 15 slices shown]
[im 1/15]
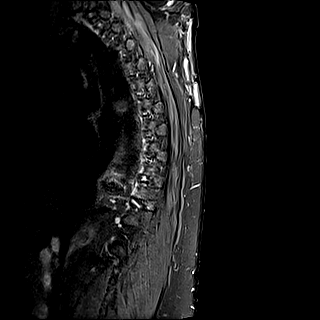
[im 4/15]
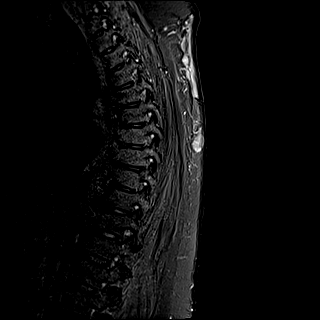
[im 8/15]
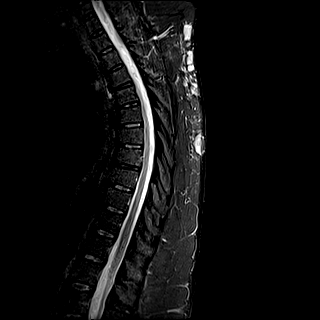
[im 11/15]
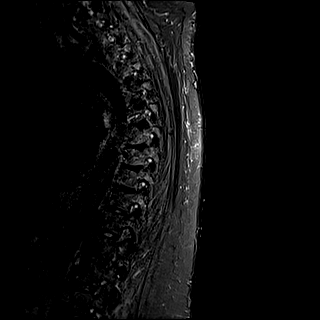
[im 15/15]
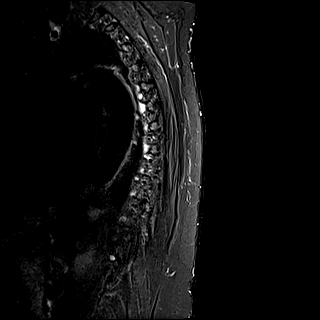

[Series 18: T1 · sagittal · 3.0mm · 1.00mm/px · 6 of 15 slices shown (2 of 2)]
[im 1/15]
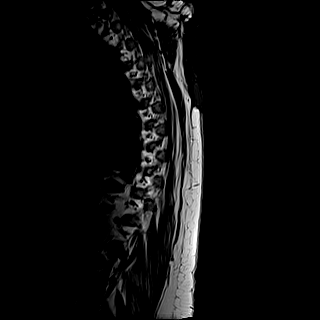
[im 3/15]
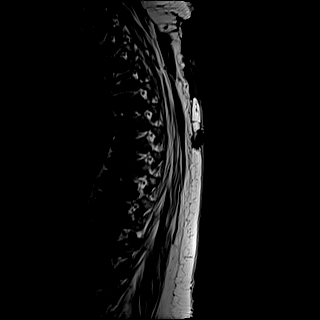
[im 6/15]
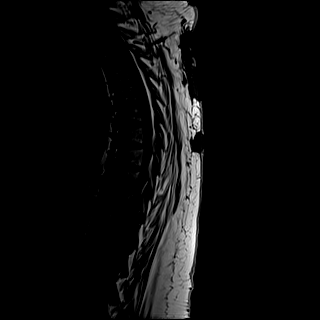
[im 9/15]
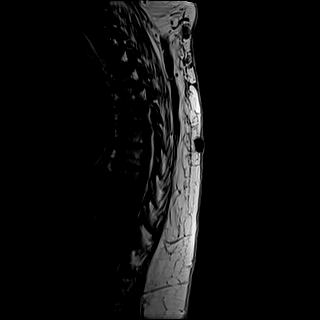
[im 12/15]
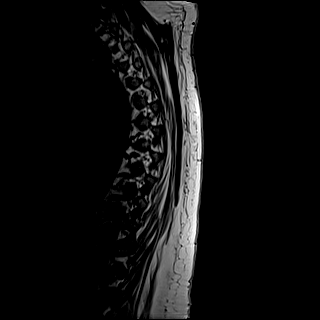
[im 15/15]
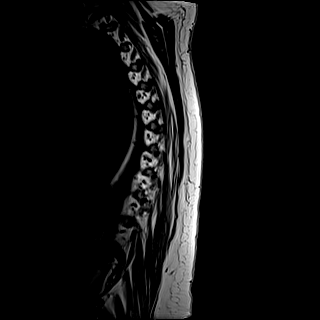

[Series 19: T2 · sagittal · 3.0mm · 0.83mm/px · 6 of 15 slices shown (1 of 2)]
[im 1/15]
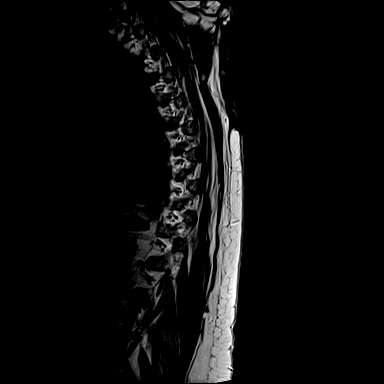
[im 3/15]
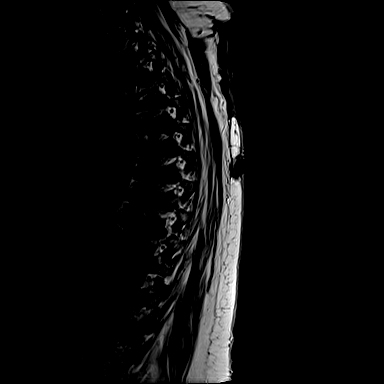
[im 6/15]
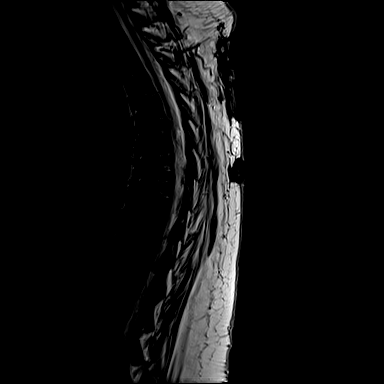
[im 9/15]
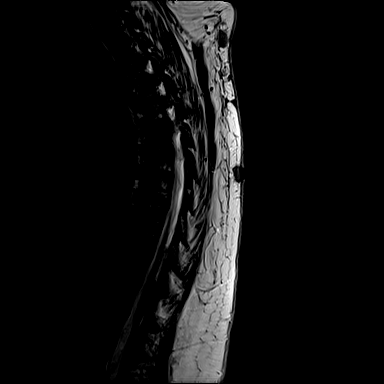
[im 12/15]
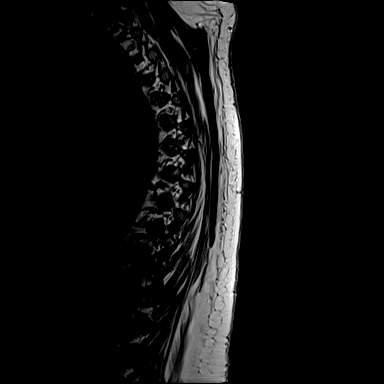
[im 15/15]
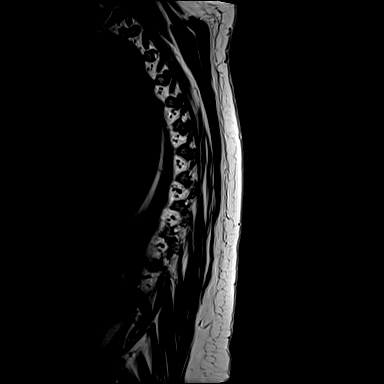

[Series 20: T2 · axial · 4.0mm · 0.78mm/px · z∈[-284,-109]mm · 9 of 39 slices shown (2 of 2)]
[im 1/39]
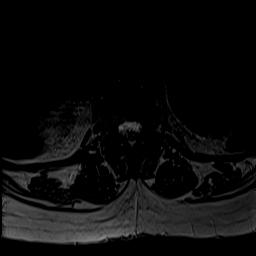
[im 6/39]
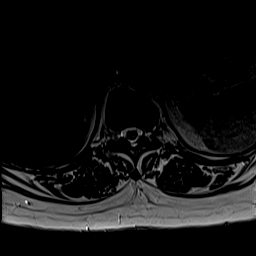
[im 11/39]
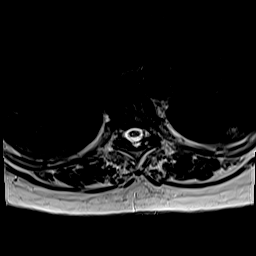
[im 17/39]
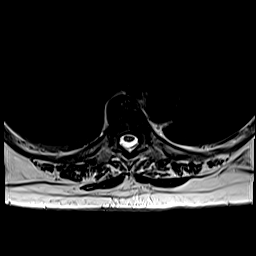
[im 20/39]
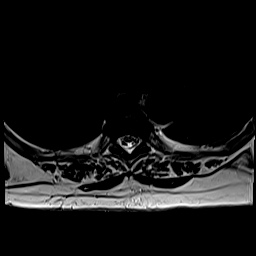
[im 22/39]
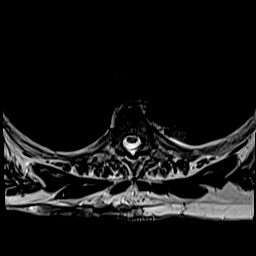
[im 28/39]
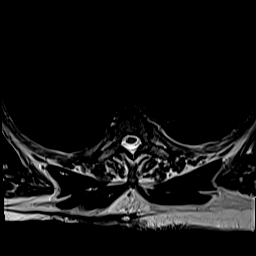
[im 33/39]
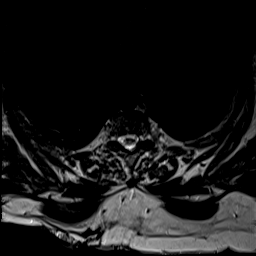
[im 39/39]
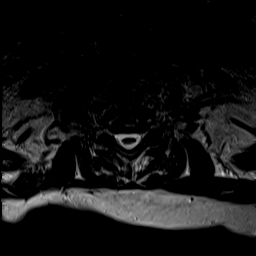

[Series 21: t2_me2d_tra · axial · 4.0mm · 0.39mm/px · z∈[-284,-137]mm · 7 of 39 slices shown]
[im 1/39]
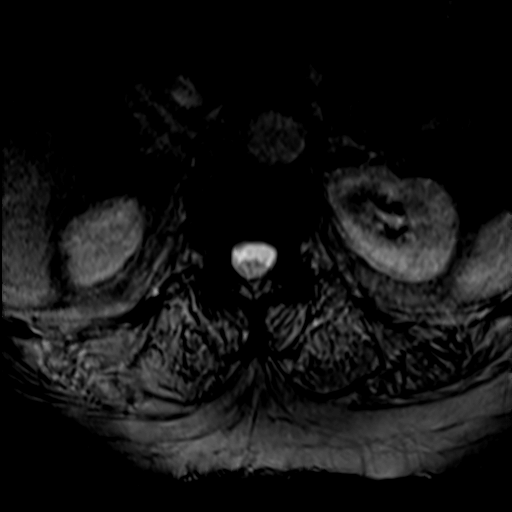
[im 6/39]
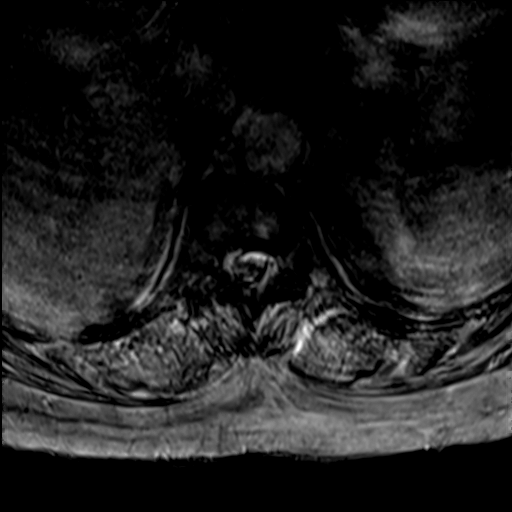
[im 11/39]
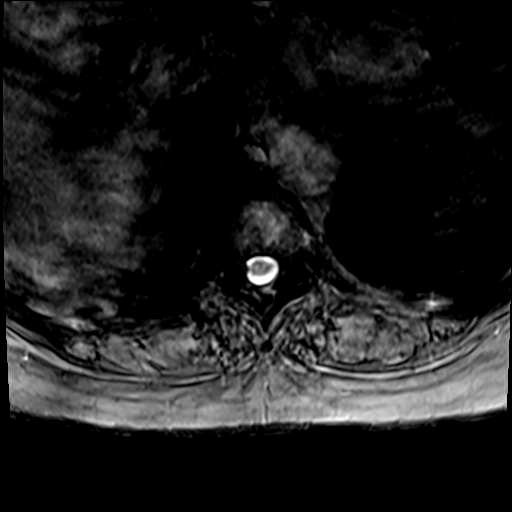
[im 17/39]
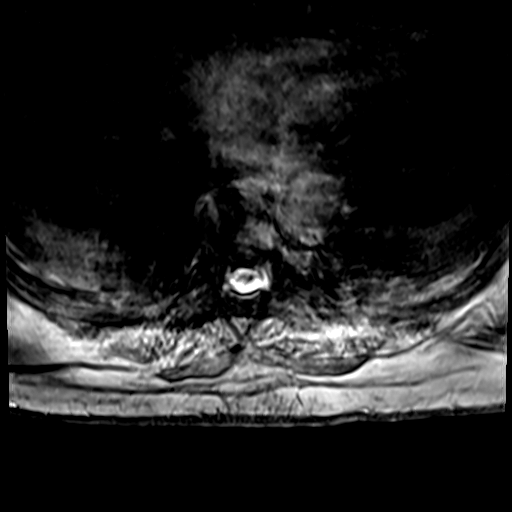
[im 22/39]
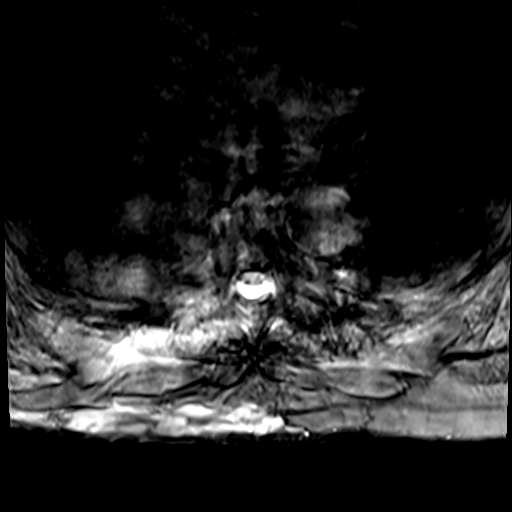
[im 28/39]
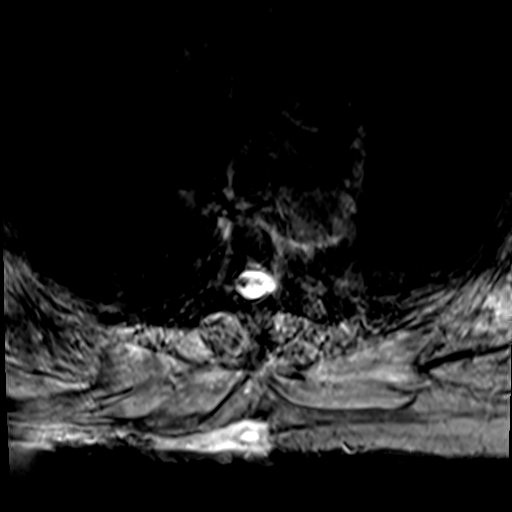
[im 33/39]
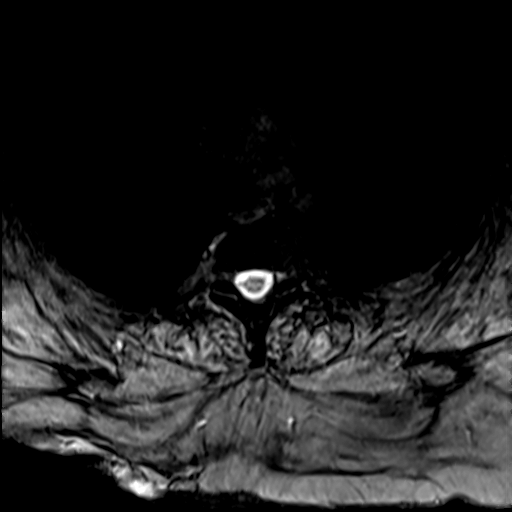

[34 of 48 positions shown; findings below may reference images not displayed]

FINDINGS: Limited cervical spine imaging: Straightening of cervical lordosis
appears stable since [REDACTED]. Grossly normal bone marrow signal in
the visible cervical spine.

Thoracic spine segmentation:  Appears normal.

Alignment:  Preserved thoracic kyphosis. No spondylolisthesis.

Vertebrae: Thoracic bone marrow signal is within normal limits. No
marrow edema or evidence of acute osseous abnormality. No suspicious
thoracic vertebral lesion identified.

However, there are questionable posterior rib lesions, such as on
the right at the T6 and T8 levels series 21, images 19 and 25.

Cord: Capacious thoracic spinal canal. Thoracic spinal cord and
conus medullaris appear within normal limits. The conus is at
T12-L1.

Paraspinal and other soft tissues: Chronic superficial skin and soft
tissue nodularity to the right of midline from the T1 through T8
thoracic levels (series 21, image 13 and series 17, image 7). This
appears grossly stable since 2477 and may be chronic scarring.
However, there was described hypermetabolism here on a 08/20/2019
PET-CT

Other thoracic paraspinal soft tissues are within normal limits.

There is mildly heterogeneous signal in the visible left lung
including a 12 mm nodular area on series 20, image 15. Trace right
pleural effusion.

Negative visible upper abdominal viscera.

Disc levels:

Mild for age thoracic spine degeneration with no spinal stenosis or
neural foraminal stenosis.
IMPRESSION: 1. No metastatic disease identified in the noncontrast thoracic
spine. Mild for age thoracic spine degeneration with no spinal
stenosis or neural impingement.
2. Questionable posterior rib lesions at the T6 and T8 levels.
Consider follow-up Chest CT or Bone Scan.
3. Indeterminate left lung nodularity also, for which repeat chest
CT would be most valuable.
4. Indeterminate superficial skin lesions over the right posterior
thorax, roughly stable since 2477 but also described by PET-CT in
[REDACTED].

## 2020-06-26 NOTE — Progress Notes (Signed)
Established Patient Office Visit  Subjective:  Patient ID: [name], female    DOB: 22-Mar-1956  Age: 64 y.o. MRN: [id number]  CC:  Chief Complaint  Patient presents with  . Pain    Patient in today for pain medications, per patient she has pain all over her body.     HPI [name] presents for as above.  Was given Zoloft by her oncologist but has not started it yet.  Past Medical History:  Diagnosis Date  . Breast cancer ([place])   . DVT (deep venous thrombosis) ([place]) 10/07/2011  . Fever 02/06/2018  . Hypertension   . Radiation 11/29/2005-12/29/2005   right supraclavicular area 4147 cGy  . Radiation 06/26/02-08/14/02   right breast 5040 cGy, tumor bed boosted to 1260 cGy  . Rheumatic fever     Past Surgical History:  Procedure Laterality Date  . BREAST SURGERY    . MASTECTOMY Bilateral   . PORT A CATH REVISION      Family History  Problem Relation Age of Onset  . Pancreatic cancer Mother   . Kidney cancer Sister 14  . Breast cancer Sister 60  . Breast cancer Other 58    Social History   Socioeconomic History  . Marital status: Single    Spouse name: Not on file  . Number of children: Not on file  . Years of education: Not on file  . Highest education level: Not on file  Occupational History  . Not on file  Tobacco Use  . Smoking status: Former Research scientist (life sciences)  . Smokeless tobacco: Never Used  Vaping Use  . Vaping Use: Never used  Substance and Sexual Activity  . Alcohol use: Never    Comment: Rarely  . Drug use: No  . Sexual activity: Not on file  Other Topics Concern  . Not on file  Social History Narrative  . Not on file   Social Determinants of Health   Financial Resource Strain:   . Difficulty of Paying Living Expenses: Not on file  Food Insecurity:   . Worried About Charity fundraiser in the Last Year: Not on file  . Ran Out of Food in the Last Year: Not on file  Transportation Needs: Unmet Transportation Needs  . Lack of Transportation  (Medical): Yes  . Lack of Transportation (Non-Medical): Yes  Physical Activity:   . Days of Exercise per Week: Not on file  . Minutes of Exercise per Session: Not on file  Stress:   . Feeling of Stress : Not on file  Social Connections:   . Frequency of Communication with Friends and Family: Not on file  . Frequency of Social Gatherings with Friends and Family: Not on file  . Attends Religious Services: Not on file  . Active Member of Clubs or Organizations: Not on file  . Attends Archivist Meetings: Not on file  . Marital Status: Not on file  Intimate Partner Violence:   . Fear of Current or Ex-Partner: Not on file  . Emotionally Abused: Not on file  . Physically Abused: Not on file  . Sexually Abused: Not on file    Outpatient Medications Prior to Visit  Medication Sig Dispense Refill  . acetaminophen (TYLENOL) 325 MG tablet Take 2 tablets (650 mg total) by mouth every 6 (six) hours as needed for mild pain (or Fever >/= 101).    . TUKYSA 150 MG tablet TAKE 2 TABLETS (300 MG TOTAL) BY MOUTH EVERY MORNING. TAKE AS DIRECTED  BY MD. 60 tablet 3  . allopurinol (ZYLOPRIM) 300 MG tablet Take 300 mg by mouth daily. (Patient not taking: Reported on 06/12/2020)    . Ascorbic Acid (VITAMIN C PO) Take 1 tablet by mouth daily. (Patient not taking: Reported on 06/12/2020)    . cholecalciferol (VITAMIN D3) 25 MCG (1000 UNIT) tablet Take 1,000 Units by mouth daily. (Patient not taking: Reported on 06/12/2020)    . gabapentin (NEURONTIN) 100 MG capsule Take 1 capsule (300 mg total) by mouth 2 (two) times daily as needed. (Patient not taking: Reported on 06/26/2020) 60 capsule 3  . iron polysaccharides (NIFEREX) 150 MG capsule Take 1 capsule (150 mg total) by mouth daily. (Patient not taking: Reported on 06/12/2020) 30 capsule 0  . levETIRAcetam (KEPPRA) 500 MG tablet Take 1 tablet (500 mg total) by mouth 2 (two) times daily. (Patient not taking: Reported on 06/12/2020) 60 tablet 2  . MAGNESIUM PO Take  1 tablet by mouth daily. (Patient not taking: Reported on 06/12/2020)    . methadone (DOLOPHINE) 10 MG tablet Take by mouth 5 (five) times daily as needed. (Patient not taking: Reported on 06/12/2020)    . Multiple Vitamin (MULTIVITAMIN) capsule Take 1 capsule by mouth daily.   (Patient not taking: Reported on 06/12/2020)    . mupirocin ointment (BACTROBAN) 2 % Place 1 application into the nose 2 (two) times daily. (Patient not taking: Reported on 06/12/2020) 22 g 0  . omeprazole (PRILOSEC) 40 MG capsule Take 1 capsule (40 mg total) by mouth daily. (Patient not taking: Reported on 06/12/2020) 30 capsule 1  . ondansetron (ZOFRAN) 4 MG tablet Take 1 tablet (4 mg total) by mouth 3 (three) times daily as needed. (Patient not taking: Reported on 06/12/2020) 20 tablet 3  . ondansetron (ZOFRAN) 4 MG tablet Take 1 tablet (4 mg total) by mouth every 6 (six) hours as needed for nausea. (Patient not taking: Reported on 06/12/2020) 20 tablet 0  . oxyCODONE (ROXICODONE) 15 MG immediate release tablet Take 15 mg by mouth 4 (four) times daily as needed. (Patient not taking: Reported on 06/26/2020)    . Oxycodone HCl 20 MG TABS Take 0.5 tablets by mouth 3 (three) times daily as needed. (Patient not taking: Reported on 06/12/2020)    . Potassium Chloride ER 20 MEQ TBCR Take 1 tablet by mouth daily. (Patient not taking: Reported on 06/12/2020)    . prochlorperazine (COMPAZINE) 10 MG tablet Take 1 tablet (10 mg total) by mouth every 6 (six) hours as needed for nausea or vomiting. (Patient not taking: Reported on 06/12/2020) 30 tablet 1  . sertraline (ZOLOFT) 100 MG tablet Take 1 tablet (100 mg total) by mouth daily. (Patient not taking: Reported on 06/12/2020) 90 tablet 2  . torsemide (DEMADEX) 20 MG tablet Take 10 mg by mouth daily. (Patient not taking: Reported on 06/12/2020)    . traZODone (DESYREL) 50 MG tablet Take 0.5-1 tablets (25-50 mg total) by mouth at bedtime as needed for sleep. (Patient not taking: Reported on 06/26/2020) 30 tablet 1    . triamterene-hydrochlorothiazide (DYAZIDE) 37.5-25 MG capsule Take 1 capsule by mouth daily. (Patient not taking: Reported on 06/12/2020)     Facility-Administered Medications Prior to Visit  Medication Dose Route Frequency Provider Last Rate Last Admin  . sodium chloride 0.9 % injection 10 mL  10 mL Intravenous PRN [name], [name], MD   10 mL at 03/04/14 1421  . sodium chloride flush (NS) 0.9 % injection 10 mL  10 mL Intracatheter PRN  [name], [name], MD   10 mL at 07/30/19 1627    Allergies  Allergen Reactions  . Tramadol Nausea Only  . Hydrocodone-Acetaminophen Itching and Rash  . Pork-Derived Products Other (See Comments)    Pt says she just doesn't eat pork; has no reaction to pork products    ROS Review of Systems  Constitutional: Negative.   Respiratory: Negative.   Cardiovascular: Negative.   Gastrointestinal: Negative.   Genitourinary: Negative.    Depression screen PHQ 2/9 03/16/2020  Decreased Interest 2  Down, Depressed, Hopeless 2  PHQ - 2 Score 4  Altered sleeping 2  Tired, decreased energy 2  Change in appetite 2  Feeling bad or failure about yourself  2  Trouble concentrating 2  Moving slowly or fidgety/restless 0  Suicidal thoughts 0  PHQ-9 Score 14  Difficult doing work/chores Very difficult  Some recent data might be hidden      Objective:    Physical Exam Vitals and nursing note reviewed.  Constitutional:      General: She is not in acute distress.    Appearance: Normal appearance. She is normal weight. She is not ill-appearing, toxic-appearing or diaphoretic.  HENT:     Head: Normocephalic and atraumatic.     Right Ear: External ear normal.     Left Ear: External ear normal.  Eyes:     General:        Right eye: No discharge.        Left eye: No discharge.     Conjunctiva/sclera: Conjunctivae normal.  Pulmonary:     Effort: Pulmonary effort is normal.  Skin:    General: Skin is warm and dry.  Neurological:     Mental Status: She  is alert and oriented to person, place, and time.  Psychiatric:        Mood and Affect: Mood normal.        Behavior: Behavior normal.     BP 108/64   Pulse 72   Temp 98.3 F (36.8 C) (Tympanic)   Ht '5\' 5"'  (1.651 m)   Wt 168 lb (76.2 kg)   SpO2 96%   BMI 27.96 kg/m  Wt Readings from Last 3 Encounters:  06/26/20 168 lb (76.2 kg)  06/12/20 164 lb (74.4 kg)  05/27/20 183 lb (83 kg)     Health Maintenance Due  Topic Date Due  . COVID-19 Vaccine (1) Never done  . PAP SMEAR-Modifier  Never done  . Fecal DNA (Cologuard)  Never done  . DEXA SCAN  Never done  . PNA vac Low Risk Adult (1 of 2 - PCV13) Never done  . INFLUENZA VACCINE  06/07/2020    There are no preventive care reminders to display for this patient.  Lab Results  Component Value Date   TSH 0.348 (L) 02/11/2020   Lab Results  Component Value Date   WBC 7.7 05/27/2020   HGB 10.4 (L) 05/27/2020   HCT 33.7 (L) 05/27/2020   MCV 84.9 05/27/2020   PLT 308 05/27/2020   Lab Results  Component Value Date   NA 141 05/27/2020   K 3.9 05/27/2020   CHLORIDE 101 11/09/2017   CO2 29 05/27/2020   GLUCOSE 97 05/27/2020   BUN 12 05/27/2020   CREATININE 1.10 (H) 05/27/2020   BILITOT 0.2 (L) 05/27/2020   ALKPHOS 116 05/27/2020   AST 15 05/27/2020   ALT 7 05/27/2020   PROT 7.1 05/27/2020   ALBUMIN 3.3 (L) 05/27/2020   CALCIUM 10.4 (H)  05/27/2020   ANIONGAP 11 05/27/2020   EGFR >60 11/09/2017   No results found for: CHOL No results found for: HDL No results found for: LDLCALC No results found for: TRIG No results found for: CHOLHDL Lab Results  Component Value Date   HGBA1C 5.6 06/12/2018      Assessment & Plan:   Problem List Items Addressed This Visit      Other   Substance abuse ([place]) - Primary   Depression with anxiety      No orders of the defined types were placed in this encounter.   Follow-up: Return in about 1 month (around 07/27/2020).  Advised patient to start the Zoloft as prescribed  by her oncologist and follow-up with me in 1 month.  Asked her to consider going to behavioral health for detox.  Libby Maw, MD

## 2020-06-27 IMAGING — MR MR CERVICAL SPINE W/O CM
5 of 6 series · 30 of 48 positions shown · non-contrast
Comparison: Prior PET-CT from 08/20/2019.

CLINICAL DATA: [age] old female with history of metastatic
breast cancer, with new onset bilateral lower extremity paresis.
Evaluate for metastatic spinal involvement.

EXAM:
MRI CERVICAL AND LUMBAR SPINE WITHOUT CONTRAST
TECHNIQUE: Multiplanar and multiecho pulse sequences of the cervical spine, to
include the craniocervical junction and cervicothoracic junction,
and lumbar spine, were obtained without intravenous contrast.

[Series 6: T1 · sagittal · 3.0mm · 0.86mm/px · 5 of 15 slices shown (1 of 2)]
[im 1/15]
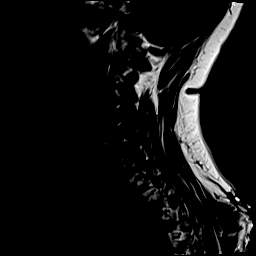
[im 4/15]
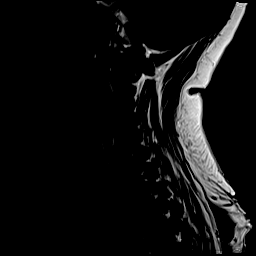
[im 8/15]
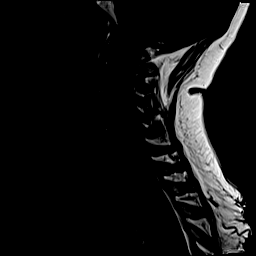
[im 11/15]
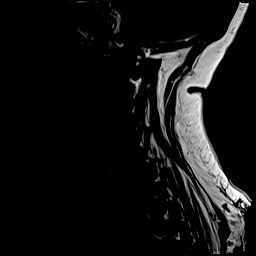
[im 15/15]
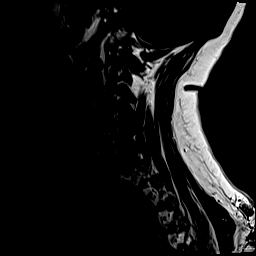

[Series 7: T2 · sagittal · 3.0mm · 0.86mm/px · 5 of 15 slices shown (1 of 2)]
[im 1/15]
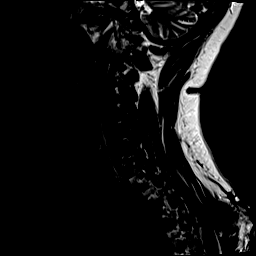
[im 4/15]
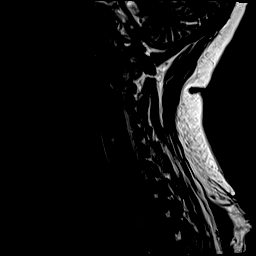
[im 8/15]
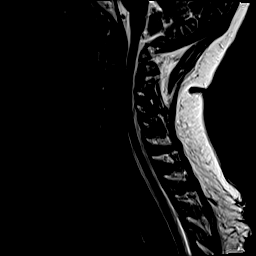
[im 11/15]
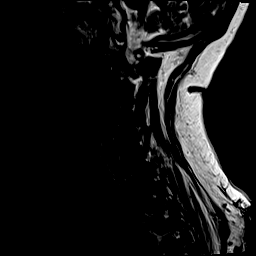
[im 15/15]
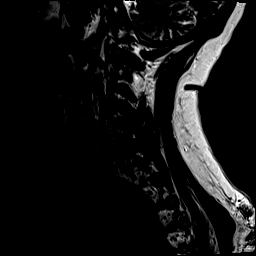

[Series 8: STIR · sagittal · 3.0mm · 0.86mm/px · 2 of 15 slices shown]
[im 1/15]
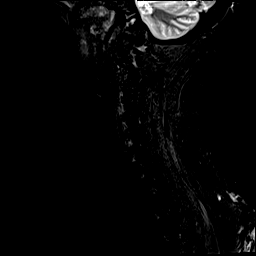
[im 4/15]
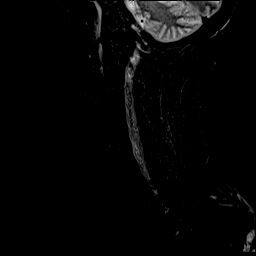

[Series 9: T2 · axial · 3.0mm · 0.70mm/px · z∈[-131,-1]mm · 9 of 38 slices shown (2 of 2)]
[im 1/38]
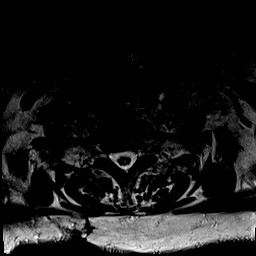
[im 7/38]
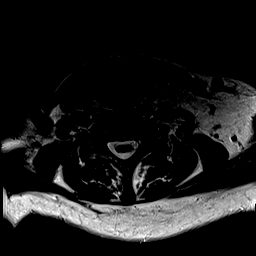
[im 11/38]
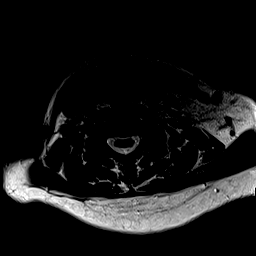
[im 17/38]
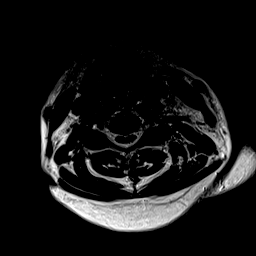
[im 21/38]
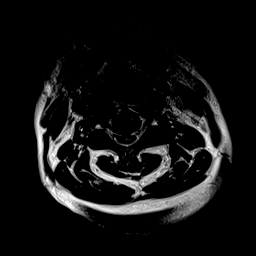
[im 27/38]
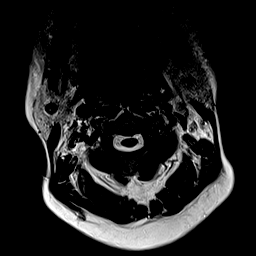
[im 31/38]
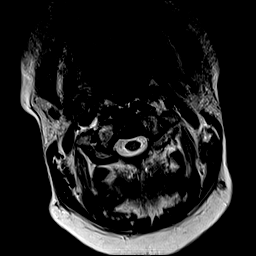
[im 34/38]
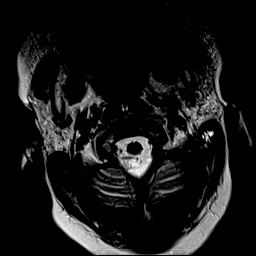
[im 38/38]
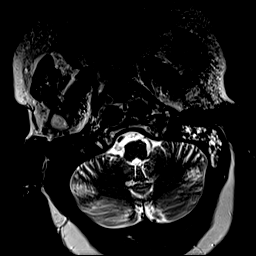

[Series 11: T1 · axial · 3.0mm · 0.35mm/px · z∈[-131,-1]mm · 9 of 38 slices shown (2 of 2)]
[im 1/38]
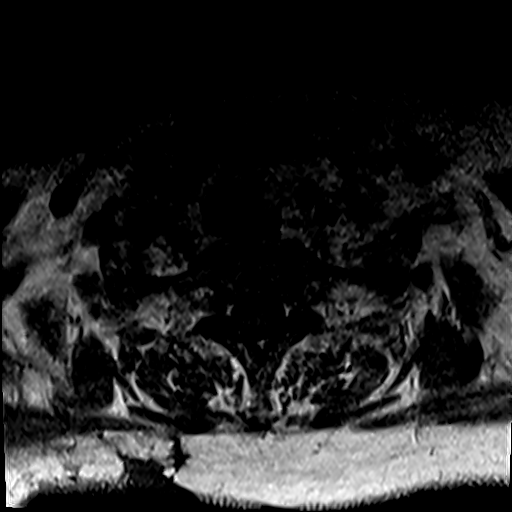
[im 7/38]
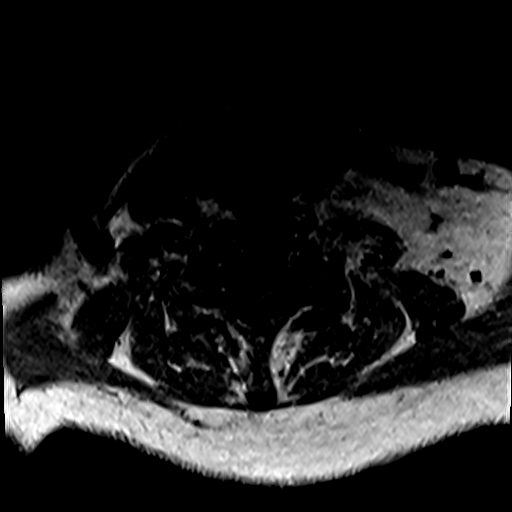
[im 11/38]
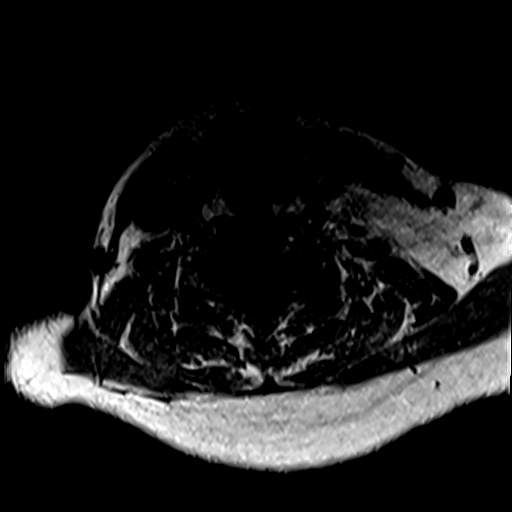
[im 17/38]
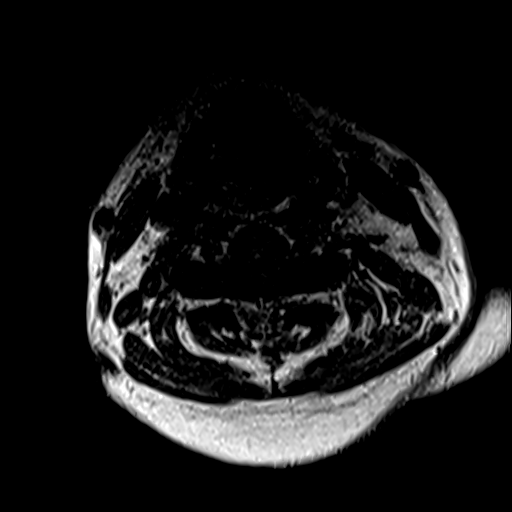
[im 21/38]
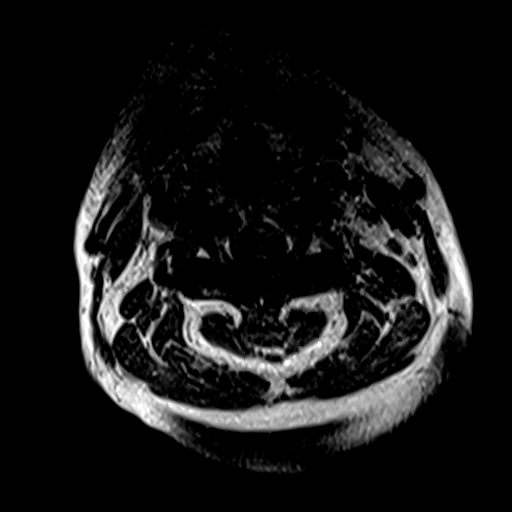
[im 27/38]
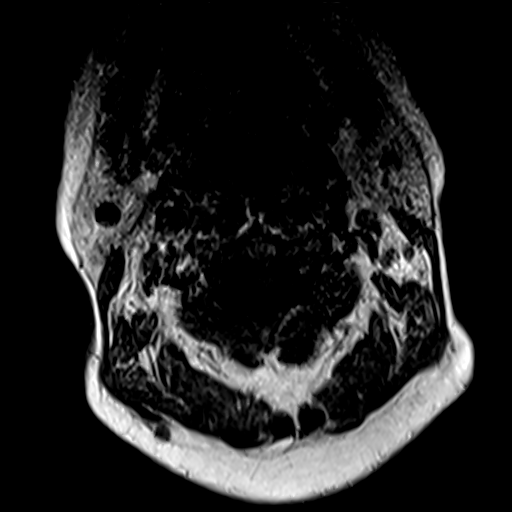
[im 31/38]
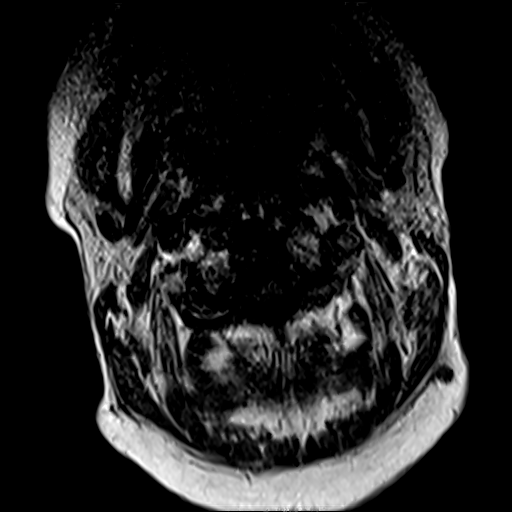
[im 34/38]
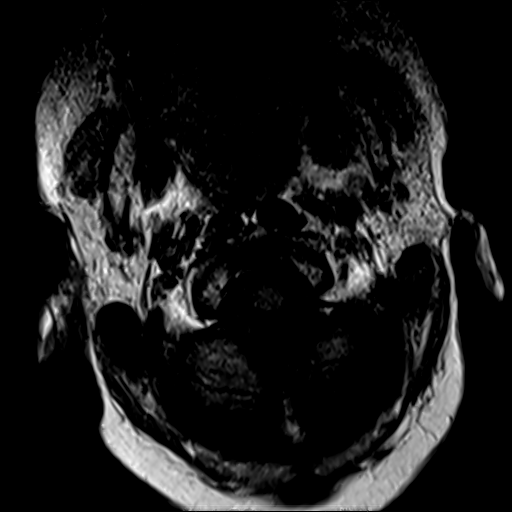
[im 38/38]
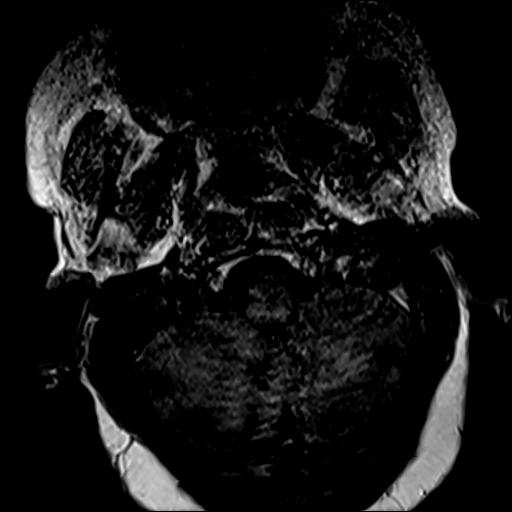

[30 of 48 positions shown; findings below may reference images not displayed]

FINDINGS: MRI CERVICAL SPINE FINDINGS

Alignment: Examination moderately degraded by motion artifact.

Mild straightening of the normal cervical lordosis. No listhesis or
subluxation.

Vertebrae: Vertebral body height maintained without evidence for
acute or chronic fracture. Bone marrow signal intensity diffusely
heterogeneous but within normal limits. No worrisome osseous lesions
or metastatic disease identified on this motion degraded noncontrast
examination. No abnormal marrow edema.

Cord: Signal intensity within the cervical spinal cord grossly
within normal limits allowing for motion artifact.

Posterior Fossa, vertebral arteries, paraspinal tissues: Left
mastoid effusion partially visualized. Visualized brain and
posterior fossa within normal limits. Craniocervical junction
normal. Superficial skin lesion partially seen involving the right
upper back (series 9, image 38), better seen on previous thoracic
spine MRI. Paraspinous and prevertebral soft tissues otherwise
within normal limits. Normal intravascular flow voids seen within
the vertebral arteries bilaterally.

Disc levels:

C2-C3: Shallow central disc protrusion mildly indents the ventral
thecal sac. Protruding disc contacts the ventral spinal cord without
significant stenosis or cord deformity. Superimposed mild left facet
degeneration. No significant foraminal narrowing.

C3-C4: Shallow broad-based central disc protrusion indents the
ventral thecal sac, contacting the ventral spinal cord. Resultant
mild spinal stenosis without definite cord deformity. Superimposed
bilateral facet hypertrophy. Foramina remain widely patent.

C4-C5: Mild disc bulge. Flattening and partial effacement of the
ventral thecal sac without significant spinal stenosis or cord
deformity. Foramina remain widely patent.

C5-C6: Minimal disc bulge with endplate and uncovertebral spurring.
No significant spinal stenosis. Foramina remain patent.

C6-C7: Mild uncovertebral hypertrophy without disc bulge. Left-sided
facet degeneration. No significant spinal stenosis. Mild left C7
foraminal narrowing. No significant right foraminal stenosis.

C7-T1: Negative interspace. Left greater than right facet
hypertrophy. No spinal stenosis. Mild left C8 foraminal narrowing.

Visualized upper thoracic spine within normal limits.

MRI LUMBAR SPINE FINDINGS

Segmentation: Examination mild to moderately degraded by motion
artifact.

Normal segmentation. Lowest well-formed disc space labeled the L5-S1
level.

Alignment: Trace anterolisthesis of L4 on L5 and L5 on S1, chronic
and facet mediated. Alignment otherwise normal with preservation of
the normal lumbar lordosis.

Vertebrae: Vertebral body height maintained without evidence for
acute or chronic fracture. Bone marrow signal intensity within
normal limits. No evidence for metastatic disease within the lumbar
spine. No worrisome osseous lesions. Subcentimeter benign hemangioma
noted within the L5 vertebral body. No abnormal marrow edema.

Conus medullaris and cauda equina: Conus extends to the L1 level.
Conus and cauda equina appear grossly normal allowing for motion
artifact.

Paraspinal and other soft tissues: Paraspinous soft tissues within
normal limits. Partially visualized visceral structures are grossly
unremarkable. Atherosclerotic change noted within the
intra-abdominal aorta. No aneurysm.

Disc levels:

L1-2: Mild diffuse disc bulge with disc desiccation. Mild to
moderate facet hypertrophy. Mild prominence of the dorsal epidural
fat. No canal or foraminal stenosis.

L2-3: Mild annular disc bulge with disc desiccation. Mild to
moderate facet hypertrophy. Mild prominence of the dorsal epidural
fat. No canal or foraminal stenosis.

L3-4: Mild annular disc bulge with disc desiccation, slightly
asymmetric to the right. Moderate facet and ligament flavum
hypertrophy. Borderline mild spinal stenosis. Mild bilateral L3
foraminal narrowing.

L4-5: Trace anterolisthesis. No significant disc bulge. Moderate
bilateral facet hypertrophy, slightly greater on the left.
Prominence of the dorsal epidural fat. Borderline mild spinal
stenosis. Mild right L4 foraminal stenosis. No significant left
foraminal narrowing.

L5-S1: Trace anterolisthesis. Disc desiccation. Shallow broad-based
central disc protrusion closely approximates the descending S1 nerve
roots without frank neural impingement or displacement. Moderate
bilateral facet hypertrophy. Epidural lipomatosis. No significant
canal or lateral recess stenosis. Mild bilateral L5 foraminal
narrowing.
IMPRESSION: MRI CERVICAL SPINE IMPRESSION:

1. No evidence for metastatic disease within the cervical spine on
this noncontrast and motion degraded exam.
2. Indeterminate superficial skin lesion involving the right upper
posterior thorax, partially visualized, and better seen on prior
thoracic spine MRI from 02/11/2020. Finding also described on
previous PET-CT from Thursday August, 2019.
3. Mild multilevel cervical spondylosis with resultant mild spinal
stenosis at C3-4, with mild left C7 and C8 foraminal narrowing.

MRI LUMBAR SPINE IMPRESSION:

1. No evidence for metastatic disease within the lumbar spine on
this non-contrast and motion degraded exam.
2. Shallow central disc protrusion at L5-S1, closely approximating
the descending S1 nerve roots without frank neural impingement
3. Mild to moderate facet hypertrophy throughout the lumbar spine,
most pronounced at L4-5 and L5-S1. Finding could contribute to
underlying lower back pain.

## 2020-06-27 IMAGING — MR MR LUMBAR SPINE W/O CM
4 of 5 series · 30 of 48 positions shown · non-contrast
Comparison: Prior PET-CT from 08/20/2019.

CLINICAL DATA: [age] old female with history of metastatic
breast cancer, with new onset bilateral lower extremity paresis.
Evaluate for metastatic spinal involvement.

EXAM:
MRI CERVICAL AND LUMBAR SPINE WITHOUT CONTRAST
TECHNIQUE: Multiplanar and multiecho pulse sequences of the cervical spine, to
include the craniocervical junction and cervicothoracic junction,
and lumbar spine, were obtained without intravenous contrast.

[Series 9: T1 · sagittal · 4.0mm · 1.02mm/px · 6 of 17 slices shown (1 of 2)]
[im 1/17]
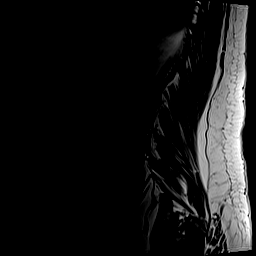
[im 4/17]
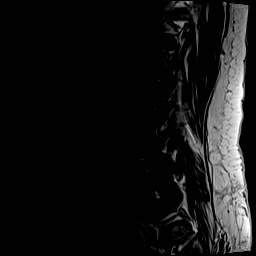
[im 7/17]
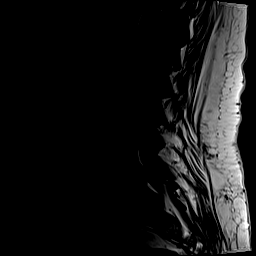
[im 10/17]
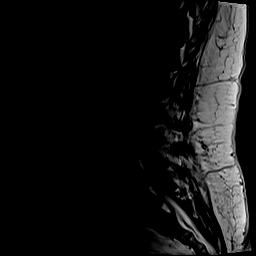
[im 13/17]
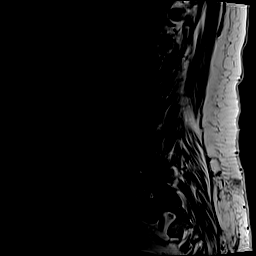
[im 17/17]
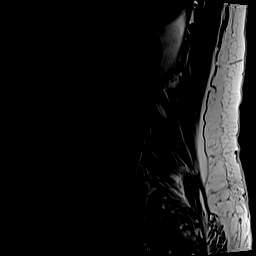

[Series 10: T2 · sagittal · 4.0mm · 1.02mm/px · 6 of 17 slices shown (1 of 2)]
[im 1/17]
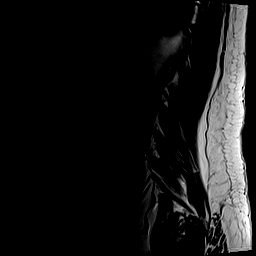
[im 4/17]
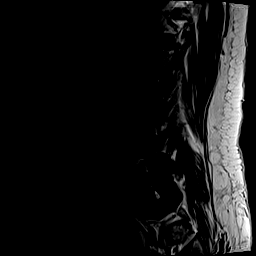
[im 7/17]
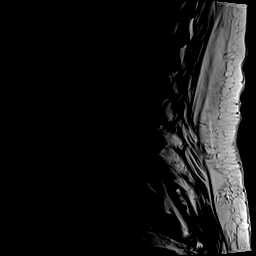
[im 10/17]
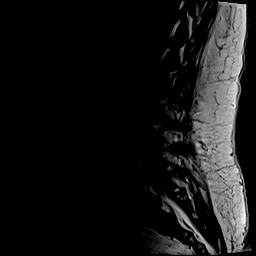
[im 13/17]
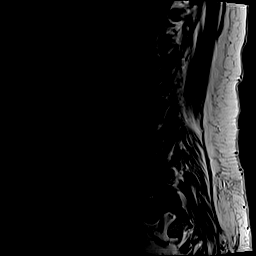
[im 17/17]
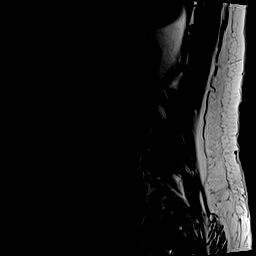

[Series 12: T2 · axial · 4.0mm · 0.78mm/px · z∈[-83,+130]mm · 9 of 41 slices shown (2 of 2)]
[im 1/41]
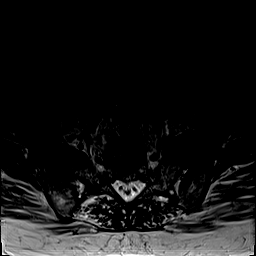
[im 6/41]
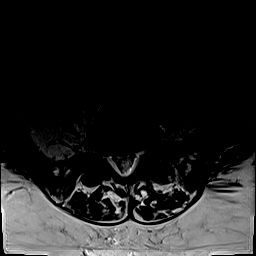
[im 12/41]
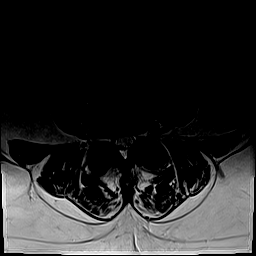
[im 18/41]
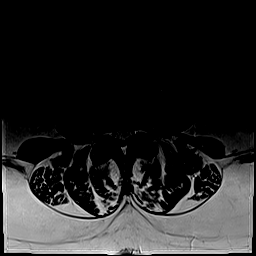
[im 21/41]
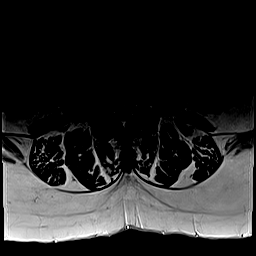
[im 23/41]
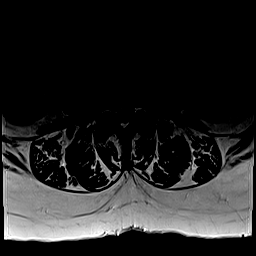
[im 29/41]
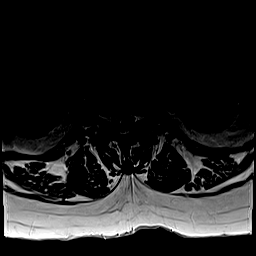
[im 35/41]
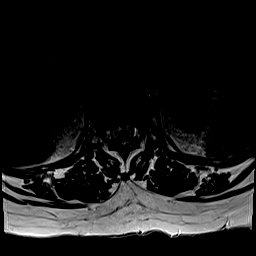
[im 41/41]
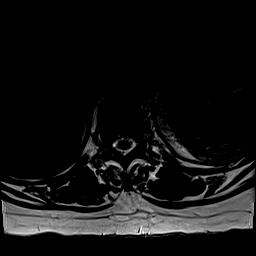

[Series 13: T1 · axial · 4.0mm · 0.43mm/px · z∈[-83,+131]mm · 9 of 41 slices shown (2 of 2)]
[im 1/41]
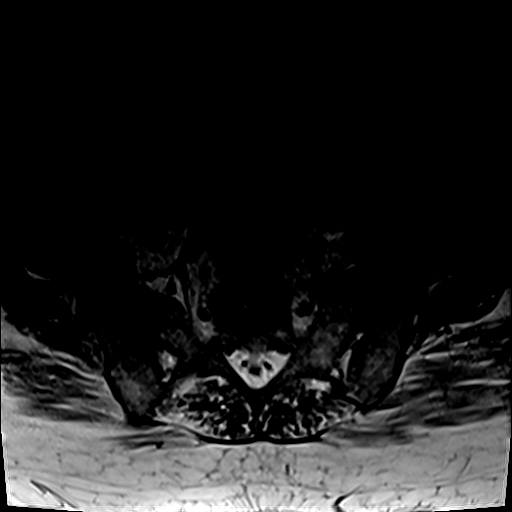
[im 6/41]
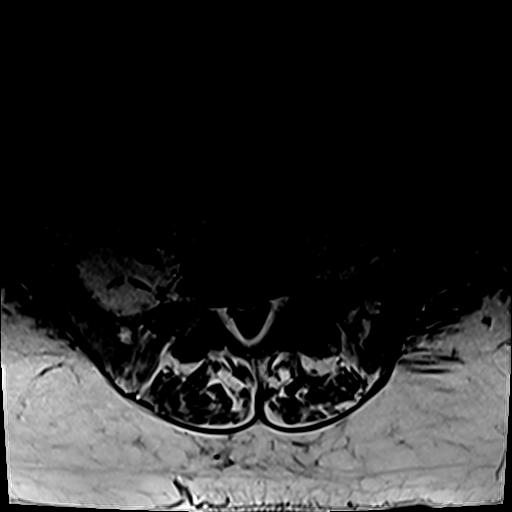
[im 12/41]
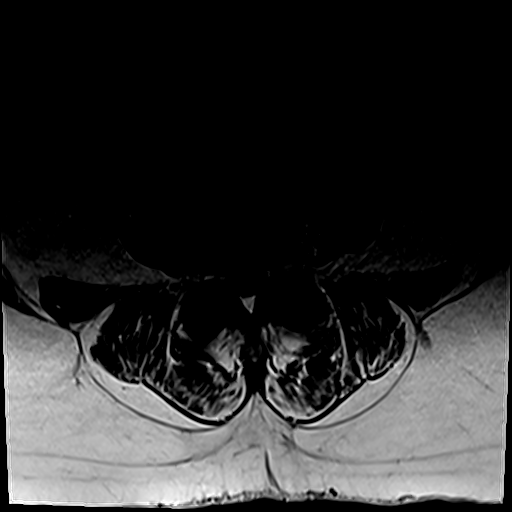
[im 18/41]
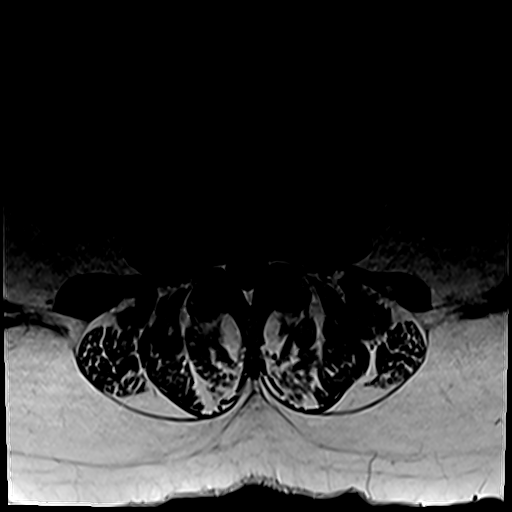
[im 21/41]
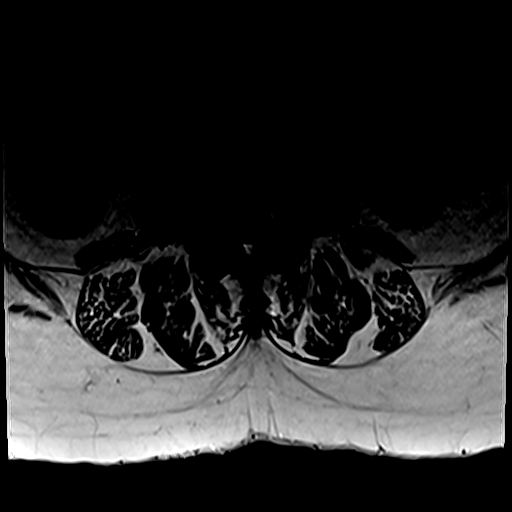
[im 23/41]
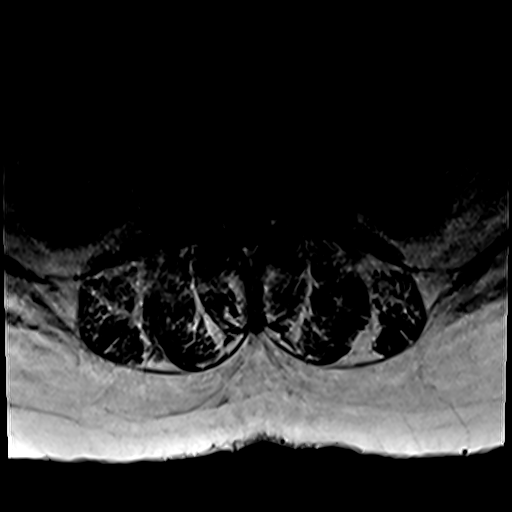
[im 29/41]
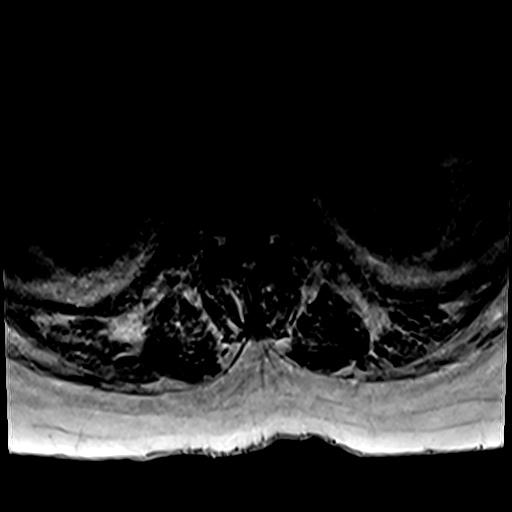
[im 35/41]
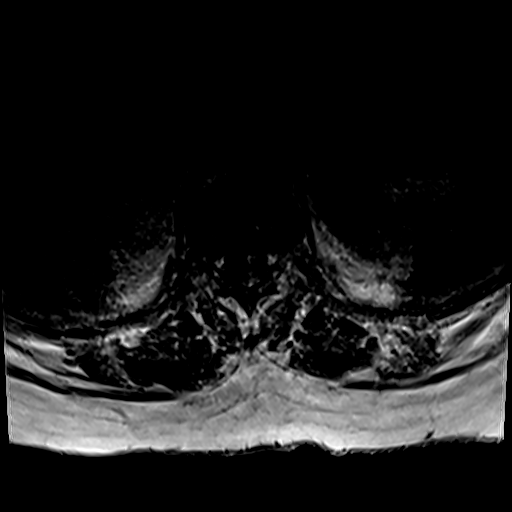
[im 41/41]
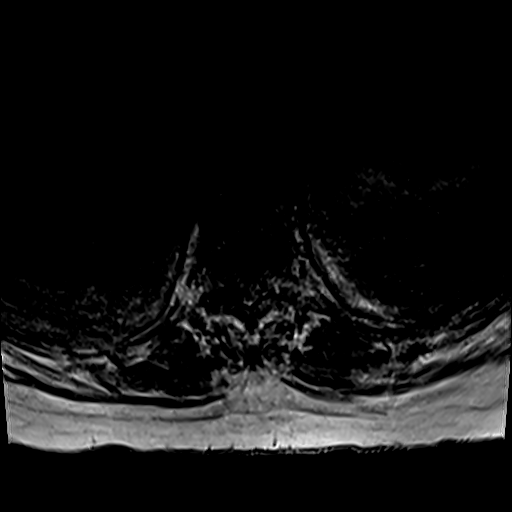

[30 of 48 positions shown; findings below may reference images not displayed]

FINDINGS: MRI CERVICAL SPINE FINDINGS

Alignment: Examination moderately degraded by motion artifact.

Mild straightening of the normal cervical lordosis. No listhesis or
subluxation.

Vertebrae: Vertebral body height maintained without evidence for
acute or chronic fracture. Bone marrow signal intensity diffusely
heterogeneous but within normal limits. No worrisome osseous lesions
or metastatic disease identified on this motion degraded noncontrast
examination. No abnormal marrow edema.

Cord: Signal intensity within the cervical spinal cord grossly
within normal limits allowing for motion artifact.

Posterior Fossa, vertebral arteries, paraspinal tissues: Left
mastoid effusion partially visualized. Visualized brain and
posterior fossa within normal limits. Craniocervical junction
normal. Superficial skin lesion partially seen involving the right
upper back (series 9, image 38), better seen on previous thoracic
spine MRI. Paraspinous and prevertebral soft tissues otherwise
within normal limits. Normal intravascular flow voids seen within
the vertebral arteries bilaterally.

Disc levels:

C2-C3: Shallow central disc protrusion mildly indents the ventral
thecal sac. Protruding disc contacts the ventral spinal cord without
significant stenosis or cord deformity. Superimposed mild left facet
degeneration. No significant foraminal narrowing.

C3-C4: Shallow broad-based central disc protrusion indents the
ventral thecal sac, contacting the ventral spinal cord. Resultant
mild spinal stenosis without definite cord deformity. Superimposed
bilateral facet hypertrophy. Foramina remain widely patent.

C4-C5: Mild disc bulge. Flattening and partial effacement of the
ventral thecal sac without significant spinal stenosis or cord
deformity. Foramina remain widely patent.

C5-C6: Minimal disc bulge with endplate and uncovertebral spurring.
No significant spinal stenosis. Foramina remain patent.

C6-C7: Mild uncovertebral hypertrophy without disc bulge. Left-sided
facet degeneration. No significant spinal stenosis. Mild left C7
foraminal narrowing. No significant right foraminal stenosis.

C7-T1: Negative interspace. Left greater than right facet
hypertrophy. No spinal stenosis. Mild left C8 foraminal narrowing.

Visualized upper thoracic spine within normal limits.

MRI LUMBAR SPINE FINDINGS

Segmentation: Examination mild to moderately degraded by motion
artifact.

Normal segmentation. Lowest well-formed disc space labeled the L5-S1
level.

Alignment: Trace anterolisthesis of L4 on L5 and L5 on S1, chronic
and facet mediated. Alignment otherwise normal with preservation of
the normal lumbar lordosis.

Vertebrae: Vertebral body height maintained without evidence for
acute or chronic fracture. Bone marrow signal intensity within
normal limits. No evidence for metastatic disease within the lumbar
spine. No worrisome osseous lesions. Subcentimeter benign hemangioma
noted within the L5 vertebral body. No abnormal marrow edema.

Conus medullaris and cauda equina: Conus extends to the L1 level.
Conus and cauda equina appear grossly normal allowing for motion
artifact.

Paraspinal and other soft tissues: Paraspinous soft tissues within
normal limits. Partially visualized visceral structures are grossly
unremarkable. Atherosclerotic change noted within the
intra-abdominal aorta. No aneurysm.

Disc levels:

L1-2: Mild diffuse disc bulge with disc desiccation. Mild to
moderate facet hypertrophy. Mild prominence of the dorsal epidural
fat. No canal or foraminal stenosis.

L2-3: Mild annular disc bulge with disc desiccation. Mild to
moderate facet hypertrophy. Mild prominence of the dorsal epidural
fat. No canal or foraminal stenosis.

L3-4: Mild annular disc bulge with disc desiccation, slightly
asymmetric to the right. Moderate facet and ligament flavum
hypertrophy. Borderline mild spinal stenosis. Mild bilateral L3
foraminal narrowing.

L4-5: Trace anterolisthesis. No significant disc bulge. Moderate
bilateral facet hypertrophy, slightly greater on the left.
Prominence of the dorsal epidural fat. Borderline mild spinal
stenosis. Mild right L4 foraminal stenosis. No significant left
foraminal narrowing.

L5-S1: Trace anterolisthesis. Disc desiccation. Shallow broad-based
central disc protrusion closely approximates the descending S1 nerve
roots without frank neural impingement or displacement. Moderate
bilateral facet hypertrophy. Epidural lipomatosis. No significant
canal or lateral recess stenosis. Mild bilateral L5 foraminal
narrowing.
IMPRESSION: MRI CERVICAL SPINE IMPRESSION:

1. No evidence for metastatic disease within the cervical spine on
this noncontrast and motion degraded exam.
2. Indeterminate superficial skin lesion involving the right upper
posterior thorax, partially visualized, and better seen on prior
thoracic spine MRI from 02/11/2020. Finding also described on
previous PET-CT from Thursday August, 2019.
3. Mild multilevel cervical spondylosis with resultant mild spinal
stenosis at C3-4, with mild left C7 and C8 foraminal narrowing.

MRI LUMBAR SPINE IMPRESSION:

1. No evidence for metastatic disease within the lumbar spine on
this non-contrast and motion degraded exam.
2. Shallow central disc protrusion at L5-S1, closely approximating
the descending S1 nerve roots without frank neural impingement
3. Mild to moderate facet hypertrophy throughout the lumbar spine,
most pronounced at L4-5 and L5-S1. Finding could contribute to
underlying lower back pain.

## 2020-06-29 MED FILL — TUKYSA 150 MG TABS: 150 | 30 days supply | Qty: 60 | Fill #1

## 2020-07-01 ENCOUNTER — Other Ambulatory Visit: Payer: Self-pay | Admitting: Oncology

## 2020-07-04 ENCOUNTER — Other Ambulatory Visit: Payer: Self-pay | Admitting: Family Medicine

## 2020-07-04 DIAGNOSIS — G4709 Other insomnia: Secondary | ICD-10-CM

## 2020-07-09 ENCOUNTER — Telehealth: Payer: Self-pay

## 2020-07-09 NOTE — Telephone Encounter (Signed)
RN attempted to call back patient several times at number provided.   No answer, no option to leave message.

## 2020-07-10 ENCOUNTER — Telehealth: Payer: Self-pay | Admitting: *Deleted

## 2020-07-10 NOTE — Telephone Encounter (Signed)
This RN received call from pt asking about " when is my next appointment ?"  This RN reviewed chart and noted no further appointments scheduled with last visit 8/18 cancelled by the patient.  This RN informed pt of above and that this RN will send request to be rescheduled.  Aerica verbalized understanding.  Request per above sent to scheduling.

## 2020-07-14 ENCOUNTER — Telehealth: Payer: Self-pay | Admitting: Oncology

## 2020-07-14 NOTE — Telephone Encounter (Signed)
Scheduled appointments per 9/3 scheduling message. Left message for patient with appointments date and times.

## 2020-07-16 ENCOUNTER — Other Ambulatory Visit: Payer: Medicare Other

## 2020-07-16 ENCOUNTER — Ambulatory Visit: Payer: Medicare Other | Admitting: Physician Assistant

## 2020-07-18 ENCOUNTER — Ambulatory Visit: Payer: Medicare Other

## 2020-07-21 ENCOUNTER — Emergency Department (HOSPITAL_COMMUNITY): Payer: Medicare Other

## 2020-07-21 ENCOUNTER — Inpatient Hospital Stay (HOSPITAL_COMMUNITY)
Admission: EM | Admit: 2020-07-21 | Discharge: 2020-07-23 | DRG: 100 | Disposition: A | Discharge status: Home / Self Care | Payer: Medicare Other | Attending: Internal Medicine | Admitting: Internal Medicine

## 2020-07-21 DIAGNOSIS — F329 Major depressive disorder, single episode, unspecified: Secondary | ICD-10-CM | POA: Diagnosis present

## 2020-07-21 DIAGNOSIS — G9340 Encephalopathy, unspecified: Secondary | ICD-10-CM | POA: Diagnosis present

## 2020-07-21 DIAGNOSIS — C50211 Malignant neoplasm of upper-inner quadrant of right female breast: Secondary | ICD-10-CM | POA: Diagnosis present

## 2020-07-21 DIAGNOSIS — G620 Drug-induced polyneuropathy: Secondary | ICD-10-CM | POA: Diagnosis present

## 2020-07-21 DIAGNOSIS — R569 Unspecified convulsions: Secondary | ICD-10-CM

## 2020-07-21 DIAGNOSIS — Z86718 Personal history of other venous thrombosis and embolism: Secondary | ICD-10-CM

## 2020-07-21 DIAGNOSIS — Z885 Allergy status to narcotic agent status: Secondary | ICD-10-CM

## 2020-07-21 DIAGNOSIS — R159 Full incontinence of feces: Secondary | ICD-10-CM | POA: Diagnosis present

## 2020-07-21 DIAGNOSIS — K219 Gastro-esophageal reflux disease without esophagitis: Secondary | ICD-10-CM | POA: Diagnosis present

## 2020-07-21 DIAGNOSIS — Z79899 Other long term (current) drug therapy: Secondary | ICD-10-CM

## 2020-07-21 DIAGNOSIS — Z20822 Contact with and (suspected) exposure to covid-19: Secondary | ICD-10-CM | POA: Diagnosis present

## 2020-07-21 DIAGNOSIS — Z803 Family history of malignant neoplasm of breast: Secondary | ICD-10-CM

## 2020-07-21 DIAGNOSIS — E875 Hyperkalemia: Secondary | ICD-10-CM

## 2020-07-21 DIAGNOSIS — G40109 Localization-related (focal) (partial) symptomatic epilepsy and epileptic syndromes with simple partial seizures, not intractable, without status epilepticus: Secondary | ICD-10-CM | POA: Diagnosis not present

## 2020-07-21 DIAGNOSIS — Y929 Unspecified place or not applicable: Secondary | ICD-10-CM

## 2020-07-21 DIAGNOSIS — T426X6A Underdosing of other antiepileptic and sedative-hypnotic drugs, initial encounter: Secondary | ICD-10-CM | POA: Diagnosis present

## 2020-07-21 DIAGNOSIS — Z91018 Allergy to other foods: Secondary | ICD-10-CM

## 2020-07-21 DIAGNOSIS — G9341 Metabolic encephalopathy: Secondary | ICD-10-CM | POA: Diagnosis present

## 2020-07-21 DIAGNOSIS — E876 Hypokalemia: Secondary | ICD-10-CM | POA: Diagnosis present

## 2020-07-21 DIAGNOSIS — Z91128 Patient's intentional underdosing of medication regimen for other reason: Secondary | ICD-10-CM

## 2020-07-21 DIAGNOSIS — Z87891 Personal history of nicotine dependence: Secondary | ICD-10-CM

## 2020-07-21 DIAGNOSIS — C7931 Secondary malignant neoplasm of brain: Secondary | ICD-10-CM | POA: Diagnosis present

## 2020-07-21 DIAGNOSIS — G936 Cerebral edema: Secondary | ICD-10-CM | POA: Diagnosis present

## 2020-07-21 DIAGNOSIS — I10 Essential (primary) hypertension: Secondary | ICD-10-CM | POA: Diagnosis present

## 2020-07-21 DIAGNOSIS — Z8051 Family history of malignant neoplasm of kidney: Secondary | ICD-10-CM

## 2020-07-21 DIAGNOSIS — Z800 Family history of malignant neoplasm of digestive organs: Secondary | ICD-10-CM

## 2020-07-21 DIAGNOSIS — G8929 Other chronic pain: Secondary | ICD-10-CM | POA: Diagnosis present

## 2020-07-21 DIAGNOSIS — D509 Iron deficiency anemia, unspecified: Secondary | ICD-10-CM | POA: Diagnosis present

## 2020-07-21 DIAGNOSIS — Z9013 Acquired absence of bilateral breasts and nipples: Secondary | ICD-10-CM

## 2020-07-21 DIAGNOSIS — Z66 Do not resuscitate: Secondary | ICD-10-CM | POA: Diagnosis present

## 2020-07-21 DIAGNOSIS — Z170 Estrogen receptor positive status [ER+]: Secondary | ICD-10-CM

## 2020-07-21 LAB — COMPREHENSIVE METABOLIC PANEL
ALT: 39 U/L (ref 0–44)
AST: 90 U/L — ABNORMAL HIGH (ref 15–41)
Albumin: 2.8 g/dL — ABNORMAL LOW (ref 3.5–5.0)
Alkaline Phosphatase: 70 U/L (ref 38–126)
Anion gap: 10 (ref 5–15)
BUN: 9 mg/dL (ref 8–23)
CO2: 27 mmol/L (ref 22–32)
Calcium: 8.9 mg/dL (ref 8.9–10.3)
Chloride: 107 mmol/L (ref 98–111)
Creatinine, Ser: 0.76 mg/dL (ref 0.44–1.00)
GFR calc Af Amer: >60 mL/min (ref >60)
GFR calc non Af Amer: >60 mL/min (ref >60)
Glucose, Bld: 97 mg/dL (ref 70–99)
Potassium: 2.5 mmol/L — CL (ref 3.5–5.1)
Sodium: 144 mmol/L (ref 135–145)
Total Bilirubin: 0.5 mg/dL (ref 0.3–1.2)
Total Protein: 5.6 g/dL — ABNORMAL LOW (ref 6.5–8.1)

## 2020-07-21 LAB — CBC WITH DIFFERENTIAL/PLATELET
Abs Immature Granulocytes: 0.07 10*3/uL (ref 0.00–0.07)
Basophils Absolute: 0 10*3/uL (ref 0.0–0.1)
Basophils Relative: 0 %
Eosinophils Absolute: 0.2 10*3/uL (ref 0.0–0.5)
Eosinophils Relative: 3 %
HCT: 31.9 % — ABNORMAL LOW (ref 36.0–46.0)
Hemoglobin: 9.7 g/dL — ABNORMAL LOW (ref 12.0–15.0)
Immature Granulocytes: 1 %
Lymphocytes Relative: 14 %
Lymphs Abs: 1.2 10*3/uL (ref 0.7–4.0)
MCH: 25.1 pg — ABNORMAL LOW (ref 26.0–34.0)
MCHC: 30.4 g/dL (ref 30.0–36.0)
MCV: 82.6 fL (ref 80.0–100.0)
Monocytes Absolute: 0.7 10*3/uL (ref 0.1–1.0)
Monocytes Relative: 8 %
Neutro Abs: 6.2 10*3/uL (ref 1.7–7.7)
Neutrophils Relative %: 74 %
Platelets: 289 10*3/uL (ref 150–400)
RBC: 3.86 MIL/uL — ABNORMAL LOW (ref 3.87–5.11)
RDW: 18 % — ABNORMAL HIGH (ref 11.5–15.5)
WBC: 8.4 10*3/uL (ref 4.0–10.5)
nRBC: 0 % (ref 0.0–0.2)

## 2020-07-21 LAB — CBG MONITORING, ED: Glucose-Capillary: 77 mg/dL (ref 70–99)

## 2020-07-21 MED ORDER — DEXAMETHASONE SODIUM PHOSPHATE 10 MG/ML IJ SOLN
10.0000 mg | Freq: Once | INTRAMUSCULAR | Status: AC
  Administered 2020-07-21: 10 mg via INTRAVENOUS
  Filled 2020-07-21: qty 1

## 2020-07-21 MED ORDER — SODIUM CHLORIDE 0.9 % IV BOLUS
1000.0000 mL | Freq: Once | INTRAVENOUS | Status: AC
  Administered 2020-07-21: 1000 mL via INTRAVENOUS

## 2020-07-21 MED ORDER — POTASSIUM CHLORIDE 10 MEQ/100ML IV SOLN
10.0000 meq | Freq: Once | INTRAVENOUS | Status: AC
  Administered 2020-07-21: 10 meq via INTRAVENOUS
  Filled 2020-07-21: qty 100

## 2020-07-21 MED ORDER — POTASSIUM CHLORIDE CRYS ER 20 MEQ PO TBCR
40.0000 meq | EXTENDED_RELEASE_TABLET | Freq: Once | ORAL | Status: DC
  Filled 2020-07-21: qty 2

## 2020-07-21 MED ORDER — LEVETIRACETAM IN NACL 1000 MG/100ML IV SOLN
1000.0000 mg | Freq: Once | INTRAVENOUS | Status: AC
  Administered 2020-07-21: 1000 mg via INTRAVENOUS
  Filled 2020-07-21: qty 100

## 2020-07-21 MED ORDER — LEVETIRACETAM IN NACL 500 MG/100ML IV SOLN
500.0000 mg | Freq: Once | INTRAVENOUS | Status: AC
  Administered 2020-07-22: 500 mg via INTRAVENOUS
  Filled 2020-07-21: qty 100

## 2020-07-21 NOTE — ED Notes (Signed)
Daug. [name] would like an update  [phone]

## 2020-07-21 NOTE — ED Triage Notes (Signed)
Pt bib [facility] for reports of unconsciousness. EMS stated that family found pt unconscious with eye twitching. Pt only able to respond with eye movement with family. Pt talking to this RN and able to answer questions in triage. Pt has not ben compliant with Kepra medication. Pt takese methadone and oxycodone at home as well.   Pt has port in left chest. Hx of breast cancer   Pt A&O to self only with EMS.   Vital signs 128/91 BP 91 HR 24 RR 109 CBG

## 2020-07-21 NOTE — ED Provider Notes (Signed)
[facility] EMERGENCY DEPARTMENT Provider Note   CSN: [id number] Arrival date & time: 07/21/20  1656     History No chief complaint on file.   [name] is a 64 y.o. female past medical history of breast cancer with mets to the brain, hypertension, DVT who presents for evaluation of possible seizure activity.  EMS reports that they were called out by family who went to check on her in the bedroom and her eyes and face were twitching.  She was altered.  On EMS arrival, she had become more alert but was still confused.  Patient had also had fecal incontinence.  Patient states she does not remember what happened.  She states she remembers going to sleep last night.  She states that she is not taking any pain medications in the last few days.  She denies any recent drug use.  Denies any alcohol use.  She states she has not gotten vaccinated for Covid.  Denies any Covid exposure.  She denies any chest pain, difficulty breathing, abdominal pain, nausea/vomiting, numbness/weakness of her arms or legs.  EM LEVEL 5 CAVEAT DUE TO AMS  The history is provided by the patient.       Past Medical History:  Diagnosis Date  . Breast cancer ([place])   . DVT (deep venous thrombosis) ([place]) 10/07/2011  . Fever 02/06/2018  . Hypertension   . Radiation 11/29/2005-12/29/2005   right supraclavicular area 4147 cGy  . Radiation 06/26/02-08/14/02   right breast 5040 cGy, tumor bed boosted to 1260 cGy  . Rheumatic fever     Patient Active Problem List   Diagnosis Date Noted  . Depression with anxiety 06/26/2020  . Substance abuse ([place]) 06/12/2020  . Urinary frequency 06/12/2020  . Other insomnia 06/12/2020  . Cancer of right breast metastatic to brain ([place]) 03/16/2020  . Nausea & vomiting 02/29/2020  . UTI (urinary tract infection) 02/11/2020  . Acute metabolic encephalopathy 62/37/6283  . Generalized weakness 02/11/2020  . Hypokalemia 02/11/2020  . Paresis of left lower extremity  ([place]) 02/11/2020  . Paresis of right lower extremity ([place]) 02/11/2020  . Drug-induced polyneuropathy ([place]) 12/19/2019  . Acute respiratory failure with hypoxia ([place]) 11/19/2019  . COVID-19 virus infection 11/19/2019  . Overdose opiate, accidental or unintentional, initial encounter ([place]) 11/19/2019  . Acute encephalopathy 11/19/2019  . Metastasis to brain ([place]) 09/13/2019  . Acute lower UTI 02/06/2018  . Altered mental status 02/06/2018  . Bacteremia 02/06/2018  . Polypharmacy 02/06/2018  . Goals of care, counseling/discussion 09/05/2017  . Pneumonia 01/25/2017  . CAP (community acquired pneumonia) 01/23/2017  . Port catheter in place 05/30/2016  . 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 Chronic pain 04/13/2015  . Left arm pain 08/18/2014  . Malignant neoplasm of upper-inner quadrant of right breast in female, estrogen receptor positive ([place]) 10/03/2013  . Post-lymphadenectomy lymphedema of arm 08/15/2013  . Port or reservoir infection 01/29/2013  . DVT (deep venous thrombosis) ([place]) 10/07/2011  . Chest pain 09/10/2009  . Secondary cardiomyopathy ([place]) 09/02/2009  . Essential hypertension 02/26/2009  . GERD 02/26/2009    Past Surgical History:  Procedure Laterality Date  . BREAST SURGERY    . MASTECTOMY Bilateral   . PORT A CATH REVISION       OB History   No obstetric history on file.     Family History  Problem Relation Age of Onset  . Pancreatic cancer Mother   . Kidney cancer Sister 25  . Breast  cancer Sister 59  . Breast cancer Other 25    Social History   Tobacco Use  . Smoking status: Former Research scientist (life sciences)  . Smokeless tobacco: Never Used  Vaping Use  . Vaping Use: Never used  Substance Use Topics  . Alcohol use: Never    Comment: Rarely  . Drug use: No    Home Medications Prior to Admission medications   Medication Sig Start Date End Date Taking? Authorizing Provider  levETIRAcetam (KEPPRA) 500 MG tablet Take 1 tablet (500 mg  total) by mouth 2 (two) times daily. 03/24/20  Yes Georgette Shell, MD  Multiple Vitamin (MULTIVITAMIN) capsule Take 1 capsule by mouth daily.     Yes [provider]  Oxycodone HCl 20 MG TABS Take 20 mg by mouth 3 (three) times daily as needed.  05/02/20  Yes [provider]  acetaminophen (TYLENOL) 325 MG tablet Take 2 tablets (650 mg total) by mouth every 6 (six) hours as needed for mild pain (or Fever >/= 101). Patient not taking: Reported on 07/21/2020 03/24/20   Georgette Shell, MD  allopurinol (ZYLOPRIM) 300 MG tablet Take 300 mg by mouth daily. Patient not taking: Reported on 06/12/2020 01/20/20   [provider]  gabapentin (NEURONTIN) 100 MG capsule Take 1 capsule (300 mg total) by mouth 2 (two) times daily as needed. Patient not taking: Reported on 06/26/2020 02/18/20   [name], [name], MD  iron polysaccharides (NIFEREX) 150 MG capsule Take 1 capsule (150 mg total) by mouth daily. Patient not taking: Reported on 06/12/2020 03/02/20   Raiford Noble Latif, DO  mupirocin ointment (BACTROBAN) 2 % Place 1 application into the nose 2 (two) times daily. Patient not taking: Reported on 06/12/2020 03/24/20   Georgette Shell, MD  omeprazole (PRILOSEC) 40 MG capsule Take 1 capsule (40 mg total) by mouth daily. Patient not taking: Reported on 06/12/2020 02/04/20   [name], [name], MD  ondansetron (ZOFRAN) 4 MG tablet Take 1 tablet (4 mg total) by mouth 3 (three) times daily as needed. Patient not taking: Reported on 06/12/2020 03/02/20   Raiford Noble Latif, DO  ondansetron (ZOFRAN) 4 MG tablet Take 1 tablet (4 mg total) by mouth every 6 (six) hours as needed for nausea. Patient not taking: Reported on 06/12/2020 03/24/20   Georgette Shell, MD  Potassium Chloride ER 20 MEQ TBCR Take 1 tablet by mouth daily. Patient not taking: Reported on 06/12/2020 03/16/20   [provider]  prochlorperazine (COMPAZINE) 10 MG tablet Take 1 tablet (10 mg total) by mouth every 6  (six) hours as needed for nausea or vomiting. Patient not taking: Reported on 07/21/2020 07/01/20   [name], [name], MD  sertraline (ZOLOFT) 100 MG tablet Take 1 tablet (100 mg total) by mouth daily. Patient not taking: Reported on 06/12/2020 05/28/20   Harle Stanford., PA-C  torsemide (DEMADEX) 20 MG tablet Take 10 mg by mouth daily. Patient not taking: Reported on 06/12/2020 03/02/20   [provider]  traZODone (DESYREL) 50 MG tablet TAKE 0.5-1 TABLETS (25-50 MG TOTAL) BY MOUTH AT BEDTIME AS NEEDED FOR SLEEP. Patient not taking: Reported on 07/21/2020 07/04/20   Libby Maw, MD  triamterene-hydrochlorothiazide (DYAZIDE) 37.5-25 MG capsule Take 1 capsule by mouth daily. Patient not taking: Reported on 06/12/2020 03/16/20   [provider]  TUKYSA 150 MG tablet TAKE 2 TABLETS (300 MG TOTAL) BY MOUTH EVERY MORNING. TAKE AS DIRECTED BY MD. Patient not taking: Reported on 07/21/2020 05/26/20   [name],  [name], MD    Allergies    Tramadol, Hydrocodone-acetaminophen, and Pork-derived products  Review of Systems   Review of Systems  Unable to perform ROS: Mental status change    Physical Exam Updated Vital Signs BP 133/63   Pulse 81   Temp 99.3 F (37.4 C) (Rectal)   Resp (!) 25   Ht 5\' 4"  (1.626 m)   Wt 75.8 kg   SpO2 97%   BMI 28.67 kg/m   Physical Exam Vitals and nursing note reviewed.  Constitutional:      Appearance: Normal appearance. She is well-developed.  HENT:     Head: Normocephalic and atraumatic.  Eyes:     General: Lids are normal.     Conjunctiva/sclera: Conjunctivae normal.     Pupils: Pupils are equal, round, and reactive to light.     Comments: Pupils 2 mm bilaterally. EOMs intact.   Cardiovascular:     Rate and Rhythm: Normal rate and regular rhythm.     Pulses: Normal pulses.     Heart sounds: Normal heart sounds. No murmur heard.  No friction rub. No gallop.   Pulmonary:     Effort: Pulmonary effort is normal.     Breath sounds:  Normal breath sounds.     Comments: Lungs clear to auscultation bilaterally.  Symmetric chest rise.  No wheezing, rales, rhonchi.  Abdominal:     Palpations: Abdomen is soft. Abdomen is not rigid.     Tenderness: There is no abdominal tenderness. There is no guarding.     Comments: Abdomen is soft, non-distended, non-tender. No rigidity, No guarding. No peritoneal signs.  Musculoskeletal:        General: Normal range of motion.     Cervical back: Full passive range of motion without pain.  Skin:    General: Skin is warm and dry.     Capillary Refill: Capillary refill takes less than 2 seconds.  Neurological:     Mental Status: She is alert.     Comments: Alert and oriented x 2. She can call me her name and where she's at but does not know what year.  Cranial nerves III-XII intact Follows commands, Moves all extremities  5/5 strength to BUE and BLE  Sensation intact throughout all major nerve distribution.   No slurred speech. No facial droop.   Psychiatric:        Speech: Speech normal.     ED Results / Procedures / Treatments   Labs (all labs ordered are listed, but only abnormal results are displayed) Labs Reviewed  COMPREHENSIVE METABOLIC PANEL - Abnormal; Notable for the following components:      Result Value   Potassium 2.5 (*)    Total Protein 5.6 (*)    Albumin 2.8 (*)    AST 90 (*)    All other components within normal limits  CBC WITH DIFFERENTIAL/PLATELET - Abnormal; Notable for the following components:   RBC 3.86 (*)    Hemoglobin 9.7 (*)    HCT 31.9 (*)    MCH 25.1 (*)    RDW 18.0 (*)    All other components within normal limits  RAPID URINE DRUG SCREEN, HOSP PERFORMED  URINALYSIS, ROUTINE W REFLEX MICROSCOPIC  LEVETIRACETAM LEVEL  MAGNESIUM  CBG MONITORING, ED    EKG None  Radiology CT Head Wo Contrast  Result Date: 07/21/2020 CLINICAL DATA:  Seizure EXAM: CT HEAD WITHOUT CONTRAST TECHNIQUE: Contiguous axial images were obtained from the base of  the skull through the  vertex without intravenous contrast. COMPARISON:  CT brain and MRI brain 03/20/2020, CT 02/10/2020 FINDINGS: Brain: Hematocrit level in the right frontal lobe consistent with hemorrhagic metastatic lesion. Surrounding edema within the right frontal lobe. Considerably increased edema within the right temporal lobe at the site of known metastatic lesion. Suspected small hyperdense foci within the temporal lobe edema raises concern for small foci of hemorrhage. No midline shift. Stable ventricle size. Vascular: No hyperdense vessels.  No unexpected calcification Skull: Normal. Negative for fracture or focal lesion. Sinuses/Orbits: Fluid in the left mastoid air cells. Sinuses are clear Other: None. IMPRESSION: 1. Hematocrit level in the right frontal lobe consistent with hemorrhagic metastatic lesion with surrounding edema in the right frontal lobe, this finding was present on prior CT from May. Considerable increase in edema within the right temporal lobe at the site of known metastatic lesion. Suspected small foci of hemorrhage within the right temporal lobe edema. No midline shift. 2. Fluid in the left mastoid air cells. Critical Value/emergent results were called by telephone at the time of interpretation on 07/21/2020 at 9:19 pm to provider [facility] , who verbally acknowledged these results. Electronically Signed   By: Donavan Foil [name]   On: 07/21/2020 21:19    Procedures Procedures (including critical care time)  Medications Ordered in ED Medications  potassium chloride 10 mEq in 100 mL IVPB (has no administration in time range)  potassium chloride SA (KLOR-CON) CR tablet 40 mEq (has no administration in time range)  levETIRAcetam (KEPPRA) IVPB 500 mg/100 mL premix (has no administration in time range)  sodium chloride 0.9 % bolus 1,000 mL (1,000 mLs Intravenous New Bag/Given 07/21/20 2136)  levETIRAcetam (KEPPRA) IVPB 1000 mg/100 mL premix (0 mg Intravenous Stopped 07/21/20  2221)  dexamethasone (DECADRON) injection 10 mg (10 mg Intravenous Given 07/21/20 2226)    ED Course  I have reviewed the triage vital signs and the nursing notes.  Pertinent labs & imaging results that were available during my care of the patient were reviewed by me and considered in my medical decision making (see chart for details).    MDM Rules/Calculators/A&P                          64 y.o. F with PMH/o breast cancer with mets to the brain who presents for evaluation of possible seizure activity. Family reports that they saw patient twitching. Patient is prescribed keppra but she states she is not taking it. On ED arrival, EMS reports that patient is more responsive. She is alert and oriented x 2. She follows commands. Patient is afebrile, non-toxic appearing, sitting comfortably on examination table. Vital signs reviewed and stable. Consider seizure secondary to non-compliance. Loading dose of keppra given.   Attempted to call daughter but unable to contact anybody.   CMP shows potassium of 2.5.  Plan for repletion. Normal BUN and creatinine.  CBC shows no leukocytosis.  Hemoglobin is 9.7.  We will add a magnesium level.  Reevaluation.  Patient is more alert.  She can answer all questions appropriately and is alert and oriented x3.  CT head shows hematocrit level in the right frontal lobe consistent with hemorrhagic metastatic cysts lesion with surrounding edema of the right frontal lobe. There is a considerable increase in edema within the right temporal lobe at the site of known metastatic lesion.  Discussed patient with Dr. Lorraine Lax (Neuro). He recommends giving additional 500 mg IV Kepra for loading dose. And that patient  be started on 750 mg of Keppra BID.   Discussed patient with Dr. Reatha Armour (Neurosurgery). Recommends 10 mg of Decadron now with plans for 4mg  q6 hrs.   Admitting team can consult Dr. [name] (Neuro-Oncology) tomorrow for formal consult.   Discussed patient with Dr.  [name] Hedges (hospitalist) who accepts patient for admission.   Daughter: [name] [phone]  Portions of this note were generated with Dragon dictation software. Dictation errors may occur despite best attempts at  There is also suspected small foci of hemorrhage within the right temporal lobe edema. proofreading.  Final Clinical Impression(s) / ED Diagnoses Final diagnoses:  Hyperkalemia  Seizure-like activity [facility])    Rx / DC Orders ED Discharge Orders    None       Desma Mcgregor 07/21/20 2301    Veryl Speak, MD 07/21/20 2319

## 2020-07-21 NOTE — ED Notes (Signed)
Date and time results received: 07/21/20 1935  Test: Potassium Critical Value: 2.5  Name of Provider Notified: [place]

## 2020-07-21 NOTE — ED Notes (Signed)
Pt has had numerous watery stool movements.  Until recently she could tell us when she needed to go, she is unaware of when she needs to go now.

## 2020-07-22 ENCOUNTER — Other Ambulatory Visit: Payer: Self-pay

## 2020-07-22 ENCOUNTER — Encounter (HOSPITAL_COMMUNITY): Payer: Self-pay | Admitting: Internal Medicine

## 2020-07-22 DIAGNOSIS — Y929 Unspecified place or not applicable: Secondary | ICD-10-CM | POA: Diagnosis not present

## 2020-07-22 DIAGNOSIS — Z885 Allergy status to narcotic agent status: Secondary | ICD-10-CM | POA: Diagnosis not present

## 2020-07-22 DIAGNOSIS — Z8051 Family history of malignant neoplasm of kidney: Secondary | ICD-10-CM | POA: Diagnosis not present

## 2020-07-22 DIAGNOSIS — G8929 Other chronic pain: Secondary | ICD-10-CM | POA: Diagnosis not present

## 2020-07-22 DIAGNOSIS — G936 Cerebral edema: Secondary | ICD-10-CM | POA: Diagnosis present

## 2020-07-22 DIAGNOSIS — Z66 Do not resuscitate: Secondary | ICD-10-CM | POA: Diagnosis present

## 2020-07-22 DIAGNOSIS — E876 Hypokalemia: Secondary | ICD-10-CM

## 2020-07-22 DIAGNOSIS — Z803 Family history of malignant neoplasm of breast: Secondary | ICD-10-CM | POA: Diagnosis not present

## 2020-07-22 DIAGNOSIS — Z20822 Contact with and (suspected) exposure to covid-19: Secondary | ICD-10-CM | POA: Diagnosis present

## 2020-07-22 DIAGNOSIS — Z170 Estrogen receptor positive status [ER+]: Secondary | ICD-10-CM | POA: Diagnosis not present

## 2020-07-22 DIAGNOSIS — T426X6A Underdosing of other antiepileptic and sedative-hypnotic drugs, initial encounter: Secondary | ICD-10-CM | POA: Diagnosis present

## 2020-07-22 DIAGNOSIS — Z91128 Patient's intentional underdosing of medication regimen for other reason: Secondary | ICD-10-CM | POA: Diagnosis not present

## 2020-07-22 DIAGNOSIS — G40109 Localization-related (focal) (partial) symptomatic epilepsy and epileptic syndromes with simple partial seizures, not intractable, without status epilepticus: Secondary | ICD-10-CM

## 2020-07-22 DIAGNOSIS — G9340 Encephalopathy, unspecified: Secondary | ICD-10-CM | POA: Diagnosis not present

## 2020-07-22 DIAGNOSIS — I10 Essential (primary) hypertension: Secondary | ICD-10-CM | POA: Diagnosis present

## 2020-07-22 DIAGNOSIS — E875 Hyperkalemia: Secondary | ICD-10-CM | POA: Diagnosis present

## 2020-07-22 DIAGNOSIS — C50211 Malignant neoplasm of upper-inner quadrant of right female breast: Secondary | ICD-10-CM | POA: Diagnosis present

## 2020-07-22 DIAGNOSIS — C7931 Secondary malignant neoplasm of brain: Secondary | ICD-10-CM

## 2020-07-22 DIAGNOSIS — Z9013 Acquired absence of bilateral breasts and nipples: Secondary | ICD-10-CM | POA: Diagnosis not present

## 2020-07-22 DIAGNOSIS — Z79899 Other long term (current) drug therapy: Secondary | ICD-10-CM | POA: Diagnosis not present

## 2020-07-22 DIAGNOSIS — Z86718 Personal history of other venous thrombosis and embolism: Secondary | ICD-10-CM | POA: Diagnosis not present

## 2020-07-22 DIAGNOSIS — Z87891 Personal history of nicotine dependence: Secondary | ICD-10-CM | POA: Diagnosis not present

## 2020-07-22 DIAGNOSIS — Z91018 Allergy to other foods: Secondary | ICD-10-CM | POA: Diagnosis not present

## 2020-07-22 DIAGNOSIS — R159 Full incontinence of feces: Secondary | ICD-10-CM | POA: Diagnosis present

## 2020-07-22 DIAGNOSIS — G9341 Metabolic encephalopathy: Secondary | ICD-10-CM | POA: Diagnosis present

## 2020-07-22 DIAGNOSIS — Z800 Family history of malignant neoplasm of digestive organs: Secondary | ICD-10-CM | POA: Diagnosis not present

## 2020-07-22 LAB — BASIC METABOLIC PANEL
Anion gap: 15 (ref 5–15)
BUN: 7 mg/dL — ABNORMAL LOW (ref 8–23)
CO2: 20 mmol/L — ABNORMAL LOW (ref 22–32)
Calcium: 9.2 mg/dL (ref 8.9–10.3)
Chloride: 110 mmol/L (ref 98–111)
Creatinine, Ser: 0.82 mg/dL (ref 0.44–1.00)
GFR calc Af Amer: >60 mL/min (ref >60)
GFR calc non Af Amer: >60 mL/min (ref >60)
Glucose, Bld: 109 mg/dL — ABNORMAL HIGH (ref 70–99)
Potassium: 2.9 mmol/L — ABNORMAL LOW (ref 3.5–5.1)
Sodium: 145 mmol/L (ref 135–145)

## 2020-07-22 LAB — SARS CORONAVIRUS 2 BY RT PCR (HOSPITAL ORDER, PERFORMED IN ~~LOC~~ HOSPITAL LAB): SARS Coronavirus 2: NEGATIVE

## 2020-07-22 LAB — MAGNESIUM
Magnesium: 1.5 mg/dL — ABNORMAL LOW (ref 1.7–2.4)
Magnesium: 1.5 mg/dL — ABNORMAL LOW (ref 1.7–2.4)

## 2020-07-22 MED ORDER — FAMOTIDINE IN NACL 20-0.9 MG/50ML-% IV SOLN: 20.0000 mg | Freq: Once | INTRAVENOUS | Status: DC | PRN

## 2020-07-22 MED ORDER — MAGNESIUM SULFATE 2 GM/50ML IV SOLN
2.0000 g | Freq: Once | INTRAVENOUS | Status: AC
  Administered 2020-07-22: 2 g via INTRAVENOUS
  Filled 2020-07-22: qty 50

## 2020-07-22 MED ORDER — DIPHENHYDRAMINE HCL 50 MG/ML IJ SOLN: 50.0000 mg | Freq: Once | INTRAMUSCULAR | Status: DC | PRN

## 2020-07-22 MED ORDER — ALBUTEROL SULFATE HFA 108 (90 BASE) MCG/ACT IN AERS: 2.0000 | INHALATION_SPRAY | Freq: Once | RESPIRATORY_TRACT | Status: DC | PRN

## 2020-07-22 MED ORDER — ACETAMINOPHEN 325 MG PO TABS: 650.0000 mg | ORAL_TABLET | ORAL | Status: DC | PRN

## 2020-07-22 MED ORDER — POLYSACCHARIDE IRON COMPLEX 150 MG PO CAPS
150.0000 mg | ORAL_CAPSULE | Freq: Every day | ORAL | Status: DC
  Administered 2020-07-22 – 2020-07-23 (×2): 150 mg via ORAL
  Filled 2020-07-22 (×3): qty 1

## 2020-07-22 MED ORDER — POTASSIUM CHLORIDE ER 20 MEQ PO TBCR: 1.0000 | EXTENDED_RELEASE_TABLET | Freq: Every day | ORAL | Status: DC

## 2020-07-22 MED ORDER — ACETAMINOPHEN 325 MG PO TABS: 650.0000 mg | ORAL_TABLET | Freq: Four times a day (QID) | ORAL | Status: DC | PRN

## 2020-07-22 MED ORDER — OXYCODONE HCL 5 MG PO TABS: 20.0000 mg | ORAL_TABLET | Freq: Three times a day (TID) | ORAL | Status: DC | PRN

## 2020-07-22 MED ORDER — EPINEPHRINE 0.3 MG/0.3ML IJ SOAJ: 0.3000 mg | Freq: Once | INTRAMUSCULAR | Status: DC | PRN

## 2020-07-22 MED ORDER — POTASSIUM CHLORIDE 20 MEQ PO PACK
40.0000 meq | PACK | ORAL | Status: AC
  Administered 2020-07-22 (×2): 40 meq via ORAL
  Filled 2020-07-22 (×2): qty 2

## 2020-07-22 MED ORDER — ACETAMINOPHEN 650 MG RE SUPP: 650.0000 mg | RECTAL | Status: DC | PRN

## 2020-07-22 MED ORDER — PANTOPRAZOLE SODIUM 40 MG PO TBEC
40.0000 mg | DELAYED_RELEASE_TABLET | Freq: Every day | ORAL | Status: DC
  Administered 2020-07-22 – 2020-07-23 (×2): 40 mg via ORAL
  Filled 2020-07-22 (×2): qty 1

## 2020-07-22 MED ORDER — ENOXAPARIN SODIUM 40 MG/0.4ML ~~LOC~~ SOLN
40.0000 mg | SUBCUTANEOUS | Status: DC
  Administered 2020-07-22 – 2020-07-23 (×2): 40 mg via SUBCUTANEOUS
  Filled 2020-07-22 (×2): qty 0.4

## 2020-07-22 MED ORDER — SODIUM CHLORIDE 0.9 % IV SOLN: 100.0000 mg | Freq: Once | INTRAVENOUS | Status: DC

## 2020-07-22 MED ORDER — POTASSIUM CHLORIDE CRYS ER 20 MEQ PO TBCR
20.0000 meq | EXTENDED_RELEASE_TABLET | Freq: Every day | ORAL | Status: DC
  Administered 2020-07-22: 20 meq via ORAL
  Filled 2020-07-22: qty 1

## 2020-07-22 MED ORDER — SODIUM CHLORIDE 0.9 % IV SOLN
75.0000 mL/h | INTRAVENOUS | Status: DC
  Administered 2020-07-22: 75 mL/h via INTRAVENOUS

## 2020-07-22 MED ORDER — TRIAMTERENE-HCTZ 37.5-25 MG PO TABS: 1.0000 | ORAL_TABLET | Freq: Every day | ORAL | Status: DC

## 2020-07-22 MED ORDER — SODIUM CHLORIDE 0.9 % IV SOLN: INTRAVENOUS | Status: DC | PRN

## 2020-07-22 MED ORDER — TUCATINIB 150 MG PO TABS
300.0000 mg | ORAL_TABLET | Freq: Every morning | ORAL | Status: DC
  Administered 2020-07-23: 300 mg via ORAL
  Filled 2020-07-22 (×3): qty 2

## 2020-07-22 MED ORDER — LEVETIRACETAM 750 MG PO TABS
750.0000 mg | ORAL_TABLET | Freq: Two times a day (BID) | ORAL | Status: DC
  Administered 2020-07-22 – 2020-07-23 (×3): 750 mg via ORAL
  Filled 2020-07-22 (×4): qty 1

## 2020-07-22 MED ORDER — METHYLPREDNISOLONE SODIUM SUCC 125 MG IJ SOLR: 125.0000 mg | Freq: Once | INTRAMUSCULAR | Status: DC | PRN

## 2020-07-22 MED ORDER — DEXAMETHASONE 4 MG PO TABS
4.0000 mg | ORAL_TABLET | Freq: Four times a day (QID) | ORAL | Status: DC
  Administered 2020-07-22 – 2020-07-23 (×5): 4 mg via ORAL
  Filled 2020-07-22 (×6): qty 1

## 2020-07-22 MED ORDER — SERTRALINE HCL 100 MG PO TABS
100.0000 mg | ORAL_TABLET | Freq: Every day | ORAL | Status: DC
  Administered 2020-07-22 – 2020-07-23 (×2): 100 mg via ORAL
  Filled 2020-07-22 (×3): qty 1

## 2020-07-22 MED ORDER — CHLORHEXIDINE GLUCONATE CLOTH 2 % EX PADS
6.0000 | MEDICATED_PAD | Freq: Every day | CUTANEOUS | Status: DC
  Administered 2020-07-22 – 2020-07-23 (×2): 6 via TOPICAL

## 2020-07-22 MED ORDER — ONDANSETRON HCL 4 MG PO TABS: 4.0000 mg | ORAL_TABLET | Freq: Three times a day (TID) | ORAL | Status: DC | PRN

## 2020-07-22 NOTE — Progress Notes (Signed)
[facility]  PROGRESS NOTE  [name] TMH:[id number] DOB: December 05, 1955 DOA: 07/21/2020 PCP: Libby Maw, MD Admit date - 07/21/2020   Admitting Physician Neena Rhymes, MD  Outpatient Primary MD for the patient is Libby Maw, MD  LOS - 0 Brief Narrative   [name] is a 64 y.o. year old female with medical history significant for breast cancer with mets to the brain, hypertension, DVT  who presented on 07/21/2020 with family reporting and facial twitching and increased confusion, fecal continence and hypersomnolence and was found to have severe hypokalemia, hypomagnesemia as well as persistent edema to metastatic lesions to brain.    Subjective  Today she states she has not been taking her medications at home  Her husband states his step daughter used to help with the medications but she doesn't live with them any more  He is a Administrator and he spends a whole week on the road and spends a few days at home on the weekend.  A & P   Reported seizure-like activity.  In the setting of admitted nonadherence to [place].  Edema noted on CT shows chronic brain metastatic lesions with some amount of edema from prior CT on 03/2020 -Neurology advised increasing Keppra to 750 mg twice daily (during consultation with the ED) -Keppra level pending -Closely monitor neurologic status and for any repeat seizure-like activity  Increase somnolence and confusion, improving.  Likely multifactorial etiology including poor adherence to Keppra and possible seizure with postictal effect, possible symptoms from edema related to metastatic brain lesions, patient has history of substance abuse.  Currently alert to self, place, not to time, does not remember context of what occurred prior to coming to the hospital -[place] as mentioned above, level pending -UA pending -Continue Decadron as mentioned below  Stage IV breast cancer with frontal/temporal lobe brain  metastasis ( initially noted on MR 08/27/2019 s/p adjuvant radiation 09/11/2019-10/16/19 Last MRI on 03/20/20 showed slight increase in size of hemorrhagic right frontal metastasis with no significant edema at that time and no mass effect or new metastatic lesions CT head here shows R frontal lobe with surrounding edema that was noted on CT from 03/20/20 with no midline shift -followed by Dr. Nat Math, on abraxane -discussed with Dr. [name], on discharge continue decadron 4 mg qd ( q6 ok while in hospital) and they will discuss her case in tumor board meeting to determine next steps as outpatient Iron deficiency anemia, continue Ferrex  GERD, stable -continue PPI  Hypokalemia and hypomagnesemia, severe, improving.  In the setting of taking triamterene/HCTZ and torsemide once admitted poor adherence to potassium supplementation.  Potassium  quite low at 2.5 on admission, only slightly improved to 2.9 with oral repletion overnight.  Likely related to persistently low magnesium of 1.5 -Have discontinued triamterene/HCTZ -Replace IV magnesium -Supplement oral potassium -Monitor BMP, mag  Depression, stable -Continue Zoloft  Normocytic anemia, chronic.  Hemoglobin stable at 9.7-10.4 -monitor CBC  Hypertension, currently at goal -Discontinue triamterene/HCTZ given hypokalemia/hypomagnesemia noted continue to monitor -May warrant initiation of new oral regimen   On tucatinib 150 mg daily because of methadone interaction ( started 10/2019 but didn't start until march 2021    Family Communication  : spoke with her husband ( per patient request Guiero [name] at [phone]. He requests that tomorrow I call his daughter for update---[name] Shonna Chock ( her husband's daughter, in nursing) [phone]  Code Status : DNR as discussed on day of admission ( based on  prior admission) will confirm with patient/family  Disposition Plan  :  Patient is from home. Anticipated d/c date:  2 days. Barriers  to d/c or necessity for inpatient status:  Close monitoring to ensure continued improvement in neurologic status no repeat seizure-like activity in setting of edema from metastatic brain lesions, and stable Decadron dosing and improvement of potassium/magnesium Consults  : Neuro oncology (curbside), neurology  Procedures  : None  DVT Prophylaxis  : SCDs  Lab Results  Component Value Date   PLT 289 07/21/2020    Diet :  Diet Order    None       Inpatient Medications Scheduled Meds: . Chlorhexidine Gluconate Cloth  6 each Topical Daily  . dexamethasone  4 mg Oral Q6H  . enoxaparin (LOVENOX) injection  40 mg Subcutaneous Q24H  . iron polysaccharides  150 mg Oral Daily  . levETIRAcetam  750 mg Oral BID  . pantoprazole  40 mg Oral Daily  . potassium chloride  20 mEq Oral Daily  . potassium chloride  40 mEq Oral Once  . sertraline  100 mg Oral Daily  . tucatinib  300 mg Oral q AM   Continuous Infusions: . sodium chloride 75 mL/hr (07/22/20 0223)  . magnesium sulfate bolus IVPB     PRN Meds:.acetaminophen **OR** acetaminophen, ondansetron, oxyCODONE  Antibiotics  :   Anti-infectives (From admission, onward)   Start     Dose/Rate Route Frequency Ordered Stop   07/22/20 0515  remdesivir 100 mg in sodium chloride 0.9 % 100 mL IVPB  Status:  Discontinued        100 mg 200 mL/hr over 30 Minutes Intravenous  Once 07/22/20 0509 07/22/20 0518   07/22/20 0515  remdesivir 100 mg in sodium chloride 0.9 % 100 mL IVPB  Status:  Discontinued        100 mg 200 mL/hr over 30 Minutes Intravenous  Once 07/22/20 0509 07/22/20 0518       Objective   Vitals:   07/21/20 2030 07/22/20 0400 07/22/20 0427 07/22/20 0741  BP: 133/63 130/84 139/69 (!) 114/56  Pulse:  78 77 66  Resp: (!) 25 17 18 16   Temp:  98.4 F (36.9 C) 98.7 F (37.1 C) (!) 97.5 F (36.4 C)  TempSrc:   Oral Oral  SpO2:  98% 93% 93%  Weight:      Height:        SpO2: 93 %  Wt Readings from Last 3 Encounters:   07/21/20 75.8 kg  06/26/20 76.2 kg  06/12/20 74.4 kg     Intake/Output Summary (Last 24 hours) at 07/22/2020 1027 Last data filed at 07/22/2020 5035 Gross per 24 hour  Intake 280.66 ml  Output --  Net 280.66 ml    Physical Exam:     Awake Alert, Oriented person, place, not to time/context, follows commands No new F.N deficits,  [place].AT, Normal respiratory effort on room air, CTAB RRR,No Gallops,Rubs or new Murmurs,  +ve B.Sounds, Abd Soft, No tenderness, No rebound, guarding or rigidity. No Cyanosis, No new Rash or bruise    I have personally reviewed the following:   Data Reviewed:  CBC Recent Labs  Lab 07/21/20 1857  WBC 8.4  HGB 9.7*  HCT 31.9*  PLT 289  MCV 82.6  MCH 25.1*  MCHC 30.4  RDW 18.0*  LYMPHSABS 1.2  MONOABS 0.7  EOSABS 0.2  BASOSABS 0.0    Chemistries  Recent Labs  Lab 07/21/20 1857  NA 144  K 2.5*  CL 107  CO2 27  GLUCOSE 97  BUN 9  CREATININE 0.76  CALCIUM 8.9  MG 1.5*  AST 90*  ALT 39  ALKPHOS 70  BILITOT 0.5   ------------------------------------------------------------------------------------------------------------------ No results for input(s): CHOL, HDL, LDLCALC, TRIG, CHOLHDL, LDLDIRECT in the last 72 hours.  Lab Results  Component Value Date   HGBA1C 5.6 06/12/2018   ------------------------------------------------------------------------------------------------------------------ No results for input(s): TSH, T4TOTAL, T3FREE, THYROIDAB in the last 72 hours.  Invalid input(s): FREET3 ------------------------------------------------------------------------------------------------------------------ No results for input(s): VITAMINB12, FOLATE, FERRITIN, TIBC, IRON, RETICCTPCT in the last 72 hours.  Coagulation profile No results for input(s): INR, PROTIME in the last 168 hours.  No results for input(s): DDIMER in the last 72 hours.  Cardiac Enzymes No results for input(s): CKMB, TROPONINI, MYOGLOBIN in the last  168 hours.  Invalid input(s): CK ------------------------------------------------------------------------------------------------------------------    Component Value Date/Time   BNP 23.5 03/20/2020 1145   BNP 15.9 07/08/2011 1021    Micro Results Recent Results (from the past 240 hour(s))  SARS Coronavirus 2 by RT PCR (hospital order, performed in [facility] hospital lab) Nasopharyngeal Nasopharyngeal Swab     Status: None   Collection Time: 07/22/20  3:53 AM   Specimen: Nasopharyngeal Swab  Result Value Ref Range Status   SARS Coronavirus 2 NEGATIVE NEGATIVE Final    Comment: (NOTE) SARS-CoV-2 target nucleic acids are NOT DETECTED.  The SARS-CoV-2 RNA is generally detectable in upper and lower respiratory specimens during the acute phase of infection. The lowest concentration of SARS-CoV-2 viral copies this assay can detect is 250 copies / mL. A negative result does not preclude SARS-CoV-2 infection and should not be used as the sole basis for treatment or other patient management decisions.  A negative result may occur with improper specimen collection / handling, submission of specimen other than nasopharyngeal swab, presence of viral mutation(s) within the areas targeted by this assay, and inadequate number of viral copies (<250 copies / mL). A negative result must be combined with clinical observations, patient history, and epidemiological information.  Fact Sheet for Patients:   StrictlyIdeas.no  Fact Sheet for Healthcare Providers: BankingDealers.co.za  This test is not yet approved or  cleared by the [place] FDA and has been authorized for detection and/or diagnosis of SARS-CoV-2 by FDA under an Emergency Use Authorization (EUA).  This EUA will remain in effect (meaning this test can be used) for the duration of the COVID-19 declaration under Section 564(b)(1) of the Act, 21 U.S.C. section 360bbb-3(b)(1), unless  the authorization is terminated or revoked sooner.  Performed at [facility], [place] [address], [place], [place] [address]     Radiology Reports CT Head Wo Contrast  Result Date: 07/21/2020 CLINICAL DATA:  Seizure EXAM: CT HEAD WITHOUT CONTRAST TECHNIQUE: Contiguous axial images were obtained from the base of the skull through the vertex without intravenous contrast. COMPARISON:  CT brain and MRI brain 03/20/2020, CT 02/10/2020 FINDINGS: Brain: Hematocrit level in the right frontal lobe consistent with hemorrhagic metastatic lesion. Surrounding edema within the right frontal lobe. Considerably increased edema within the right temporal lobe at the site of known metastatic lesion. Suspected small hyperdense foci within the temporal lobe edema raises concern for small foci of hemorrhage. No midline shift. Stable ventricle size. Vascular: No hyperdense vessels.  No unexpected calcification Skull: Normal. Negative for fracture or focal lesion. Sinuses/Orbits: Fluid in the left mastoid air cells. Sinuses are clear Other: None. IMPRESSION: 1. Hematocrit level in the right frontal lobe consistent with  hemorrhagic metastatic lesion with surrounding edema in the right frontal lobe, this finding was present on prior CT from May. Considerable increase in edema within the right temporal lobe at the site of known metastatic lesion. Suspected small foci of hemorrhage within the right temporal lobe edema. No midline shift. 2. Fluid in the left mastoid air cells. Critical Value/emergent results were called by telephone at the time of interpretation on 07/21/2020 at 9:19 pm to provider [facility] , who verbally acknowledged these results. Electronically Signed   By: Donavan Foil [name]   On: 07/21/2020 21:19     Time Spent in minutes  30     Desiree Hane M.D on 07/22/2020 at 10:27 AM  To page go to [email or web address] - password [email or web address]

## 2020-07-22 NOTE — Plan of Care (Signed)
  Problem: Education: Goal: Knowledge of General Education information will improve Description: Including pain rating scale, medication(s)/side effects and non-pharmacologic comfort measures Outcome: Progressing   Problem: Health Behavior/Discharge Planning: Goal: Ability to manage health-related needs will improve Outcome: Progressing Patient requiring rectal tube after several episodes of diarrhea.    Problem: Activity: Goal: Risk for activity intolerance will decrease Outcome: Progressing  Patient up to bedside commode with one person assist, denies dyspnea. Problem: Nutrition: Goal: Adequate nutrition will be maintained Outcome: Progressing Patient consumes >50%of meal.s

## 2020-07-22 NOTE — Progress Notes (Signed)
Initial Nutrition Assessment  DOCUMENTATION CODES:   Not applicable  INTERVENTION:  Recommend liberalizing diet to regular   Ensure Enlive po TID, each supplement provides 350 kcal and 20 grams of protein  MVI daily  NUTRITION DIAGNOSIS:   Unintentional weight loss related to cancer and cancer related treatments as evidenced by percent weight loss.    GOAL:   Patient will meet greater than or equal to 90% of their needs    MONITOR:   PO intake, Supplement acceptance, Weight trends, Labs, I & O's  REASON FOR ASSESSMENT:   Malnutrition Screening Tool    ASSESSMENT:   Pt presented for evaluation of possible seizure activity in the setting of non-adherence to Keppra. Pt was found to have severe hypokalemia, hypomagnesemia as well as persistent edema to metastatic lesions to brain. PMH includes breast Ca with mets to the brain, HTN, DVT.  Pt unavailable at time of RD visit.   Per wt history, pt weighed 92.9 kg on 02/21/20. Pt now weighs 79.3 kg. This indicates a 14.4% wt loss x5 months, which is significant for time frame. Suspect pt is malnourished;however, unable to diagnose at this time without detailed diet history and/or nutrition-focused physical exam.   PO Intake: 75% x 1 recorded meal   Labs: K+ 3.3 (L) Medications: Decadron, Protonix, Klor-con  Diet Order:   Diet Order            Diet - low sodium heart healthy           Diet Heart Room service appropriate? Yes; Fluid consistency: Thin  Diet effective now                 EDUCATION NEEDS:   No education needs have been identified at this time  Skin:  Skin Assessment: Reviewed RN Assessment  Last BM:  9/16 type 7 via rectal tube  Height:   Ht Readings from Last 1 Encounters:  07/21/20 5\' 4"  (1.626 m)    Weight:   Wt Readings from Last 1 Encounters:  07/23/20 79.3 kg    BMI:  Body mass index is 30.01 kg/m.  Estimated Nutritional Needs:   Kcal:  2000-2200  Protein:  100-110  grams  Fluid:  >/=2L/d    Larkin Ina, MS, RD, LDN RD pager number and weekend/on-call pager number located in [place].

## 2020-07-22 NOTE — H&P (Signed)
History and Physical    [name] TIR:[id number] DOB: 12-Aug-1956 DOA: 07/21/2020  PCP: Libby Maw, MD (Confirm with patient/family/NH records and if not entered, this has to be entered at [facility] point of entry) Patient coming from: home  I have personally briefly reviewed patient's old medical records in [place]  Chief Complaint: focal seizure: facial twitching  HPI: [name] is a 64 y.o. female with medical history significant of  breast cancer with mets to the brain, hypertension, DVT who presents for evaluation of possible seizure activity.  EMS reports that they were called out by family who went to check on her in the bedroom and her eyes and face were twitching.  She was altered.  On EMS arrival, she had become more alert but was still confused.  Patient had also had fecal incontinence.  History is from notes as patient is hypersomnolent. Patient states she does not remember what happened.  She states she remembers going to sleep last night.  She states that she is not taking any pain medications in the last few days.  She denies any recent drug use.  Denies any alcohol use.  She has been non-adherent to her medical regimen including missing Keppra doses. She states she has not gotten vaccinated for Covid.  Denies any Covid exposure.  She denies any chest pain, difficulty breathing, abdominal pain, nausea/vomiting, numbness/weakness of her arms or legs.   ED Course: T99,3,  136/64  81  25. Exam by ED-PA non-focal except for mild confusion. Labs revealed K 2.5, Hgb 9.7. CT with metastatic lesions frontal and temporal lobes that are old but both with increased surrounding edema. NS and neurology consulted with recommendation to increase Keppra, TRH to admit with full consults to follow.  Review of Systems: As per HPI otherwise 10 point review of systems negative.    Past Medical History:  Diagnosis Date  . Breast cancer ([place])   . DVT (deep venous thrombosis) ([place])  10/07/2011  . Fever 02/06/2018  . Hypertension   . Radiation 11/29/2005-12/29/2005   right supraclavicular area 4147 cGy  . Radiation 06/26/02-08/14/02   right breast 5040 cGy, tumor bed boosted to 1260 cGy  . Rheumatic fever     Past Surgical History:  Procedure Laterality Date  . BREAST SURGERY    . MASTECTOMY Bilateral   . PORT A CATH REVISION       reports that she has quit smoking. She has never used smokeless tobacco. She reports that she does not drink alcohol and does not use drugs.  Allergies  Allergen Reactions  . Tramadol Nausea Only  . Hydrocodone-Acetaminophen Itching and Rash  . Pork-Derived Products Other (See Comments)    Pt says she just doesn't eat pork; has no reaction to pork products    Family History  Problem Relation Age of Onset  . Pancreatic cancer Mother   . Kidney cancer Sister 46  . Breast cancer Sister 50  . Breast cancer Other 33     Prior to Admission medications   Medication Sig Start Date End Date Taking? Authorizing Provider  Multiple Vitamin (MULTIVITAMIN) capsule Take 1 capsule by mouth daily.     Yes [provider]  Oxycodone HCl 20 MG TABS Take 20 mg by mouth 3 (three) times daily as needed.  05/02/20  Yes [provider]  acetaminophen (TYLENOL) 325 MG tablet Take 2 tablets (650 mg total) by mouth every 6 (six) hours as needed for mild pain (or Fever >/=  101). Patient not taking: Reported on 07/21/2020 03/24/20   Georgette Shell, MD  allopurinol (ZYLOPRIM) 300 MG tablet Take 300 mg by mouth daily. Patient not taking: Reported on 06/12/2020 01/20/20   [provider]  gabapentin (NEURONTIN) 100 MG capsule Take 1 capsule (300 mg total) by mouth 2 (two) times daily as needed. Patient not taking: Reported on 06/26/2020 02/18/20   [name], [name], MD  iron polysaccharides (NIFEREX) 150 MG capsule Take 1 capsule (150 mg total) by mouth daily. Patient not taking: Reported on 06/12/2020 03/02/20   Raiford Noble Latif,  DO  mupirocin ointment (BACTROBAN) 2 % Place 1 application into the nose 2 (two) times daily. Patient not taking: Reported on 06/12/2020 03/24/20   Georgette Shell, MD  omeprazole (PRILOSEC) 40 MG capsule Take 1 capsule (40 mg total) by mouth daily. Patient not taking: Reported on 06/12/2020 02/04/20   [name], [name], MD  ondansetron (ZOFRAN) 4 MG tablet Take 1 tablet (4 mg total) by mouth 3 (three) times daily as needed. Patient not taking: Reported on 06/12/2020 03/02/20   Raiford Noble Latif, DO  ondansetron (ZOFRAN) 4 MG tablet Take 1 tablet (4 mg total) by mouth every 6 (six) hours as needed for nausea. Patient not taking: Reported on 06/12/2020 03/24/20   Georgette Shell, MD  Potassium Chloride ER 20 MEQ TBCR Take 1 tablet by mouth daily. Patient not taking: Reported on 06/12/2020 03/16/20   [provider]  prochlorperazine (COMPAZINE) 10 MG tablet Take 1 tablet (10 mg total) by mouth every 6 (six) hours as needed for nausea or vomiting. Patient not taking: Reported on 07/21/2020 07/01/20   [name], [name], MD  sertraline (ZOLOFT) 100 MG tablet Take 1 tablet (100 mg total) by mouth daily. Patient not taking: Reported on 06/12/2020 05/28/20   Harle Stanford., PA-C  torsemide (DEMADEX) 20 MG tablet Take 10 mg by mouth daily. Patient not taking: Reported on 06/12/2020 03/02/20   [provider]  traZODone (DESYREL) 50 MG tablet TAKE 0.5-1 TABLETS (25-50 MG TOTAL) BY MOUTH AT BEDTIME AS NEEDED FOR SLEEP. Patient not taking: Reported on 07/21/2020 07/04/20   Libby Maw, MD  triamterene-hydrochlorothiazide (DYAZIDE) 37.5-25 MG capsule Take 1 capsule by mouth daily. Patient not taking: Reported on 06/12/2020 03/16/20   [provider]  TUKYSA 150 MG tablet TAKE 2 TABLETS (300 MG TOTAL) BY MOUTH EVERY MORNING. TAKE AS DIRECTED BY MD. Patient not taking: Reported on 07/21/2020 05/26/20   [name], [name], MD    Physical Exam: Vitals:   07/21/20 1900 07/21/20 1930  07/21/20 2000 07/21/20 2030  BP: 130/65 128/62 139/64 133/63  Pulse:      Resp: (!) 25 (!) 26 (!) 28 (!) 25  Temp:      TempSrc:      SpO2:      Weight:      Height:         Vitals:   07/21/20 1900 07/21/20 1930 07/21/20 2000 07/21/20 2030  BP: 130/65 128/62 139/64 133/63  Pulse:      Resp: (!) 25 (!) 26 (!) 28 (!) 25  Temp:      TempSrc:      SpO2:      Weight:      Height:       General:  hypersomnolent and difficult to arouse woman Eyes: PERRL, lids and conjunctivae normal ENMT: Mucous membranes are moist.  Neck: normal, supple, no masses, no thyromegaly Chest - bilateral mastectomy scars.  Porta-cath left upper chest Respiratory: clear to auscultation bilaterally, no wheezing, no crackles. Normal respiratory effort. No accessory muscle use.  Cardiovascular: Regular rate and rhythm, no murmurs / rubs / gallops.\ Abdomen: obese, no tenderness, no masses palpated. No hepatosplenomegaly. Bowel sounds positive.  Musculoskeletal: no clubbing / cyanosis. No joint deformity upper and lower extremities.  Skin: no rashes, lesions, ulcers. No induration. Old burn scar medialaspect proximal Right UE. Neurologic: CN 2-12 grossly intact. Patient arouses to voice and physical stimuli.  Psychiatric:  Somnolent/obtunded.      Labs on Admission: I have personally reviewed following labs and imaging studies  CBC: Recent Labs  Lab 07/21/20 1857  WBC 8.4  NEUTROABS 6.2  HGB 9.7*  HCT 31.9*  MCV 82.6  PLT 027   Basic Metabolic Panel: Recent Labs  Lab 07/21/20 1857  NA 144  K 2.5*  CL 107  CO2 27  GLUCOSE 97  BUN 9  CREATININE 0.76  CALCIUM 8.9   GFR: Estimated Creatinine Clearance: 69.8 mL/min (by C-G formula based on SCr of 0.76 mg/dL). Liver Function Tests: Recent Labs  Lab 07/21/20 1857  AST 90*  ALT 39  ALKPHOS 70  BILITOT 0.5  PROT 5.6*  ALBUMIN 2.8*   No results for input(s): LIPASE, AMYLASE in the last 168 hours. No results for input(s): AMMONIA in  the last 168 hours. Coagulation Profile: No results for input(s): INR, PROTIME in the last 168 hours. Cardiac Enzymes: No results for input(s): CKTOTAL, CKMB, CKMBINDEX, TROPONINI in the last 168 hours. BNP (last 3 results) No results for input(s): PROBNP in the last 8760 hours. HbA1C: No results for input(s): HGBA1C in the last 72 hours. CBG: Recent Labs  Lab 07/21/20 1719  GLUCAP 77   Lipid Profile: No results for input(s): CHOL, HDL, LDLCALC, TRIG, CHOLHDL, LDLDIRECT in the last 72 hours. Thyroid Function Tests: No results for input(s): TSH, T4TOTAL, FREET4, T3FREE, THYROIDAB in the last 72 hours. Anemia Panel: No results for input(s): VITAMINB12, FOLATE, FERRITIN, TIBC, IRON, RETICCTPCT in the last 72 hours. Urine analysis:    Component Value Date/Time   COLORURINE YELLOW 06/12/2020 1115   APPEARANCEUR CLEAR 06/12/2020 1115   LABSPEC 1.010 06/12/2020 1115   PHURINE 7.0 06/12/2020 1115   GLUCOSEU NEGATIVE 06/12/2020 1115   HGBUR NEGATIVE 06/12/2020 1115   BILIRUBINUR NEGATIVE 06/12/2020 1115   KETONESUR NEGATIVE 06/12/2020 1115   PROTEINUR NEGATIVE 03/20/2020 1110   UROBILINOGEN 0.2 06/12/2020 1115   NITRITE NEGATIVE 06/12/2020 1115   LEUKOCYTESUR NEGATIVE 06/12/2020 1115    Radiological Exams on Admission: CT Head Wo Contrast  Result Date: 07/21/2020 CLINICAL DATA:  Seizure EXAM: CT HEAD WITHOUT CONTRAST TECHNIQUE: Contiguous axial images were obtained from the base of the skull through the vertex without intravenous contrast. COMPARISON:  CT brain and MRI brain 03/20/2020, CT 02/10/2020 FINDINGS: Brain: Hematocrit level in the right frontal lobe consistent with hemorrhagic metastatic lesion. Surrounding edema within the right frontal lobe. Considerably increased edema within the right temporal lobe at the site of known metastatic lesion. Suspected small hyperdense foci within the temporal lobe edema raises concern for small foci of hemorrhage. No midline shift. Stable  ventricle size. Vascular: No hyperdense vessels.  No unexpected calcification Skull: Normal. Negative for fracture or focal lesion. Sinuses/Orbits: Fluid in the left mastoid air cells. Sinuses are clear Other: None. IMPRESSION: 1. Hematocrit level in the right frontal lobe consistent with hemorrhagic metastatic lesion with surrounding edema in the right frontal lobe, this finding was present on prior CT from  May. Considerable increase in edema within the right temporal lobe at the site of known metastatic lesion. Suspected small foci of hemorrhage within the right temporal lobe edema. No midline shift. 2. Fluid in the left mastoid air cells. Critical Value/emergent results were called by telephone at the time of interpretation on 07/21/2020 at 9:19 pm to provider [facility] , who verbally acknowledged these results. Electronically Signed   By: Donavan Foil [name]   On: 07/21/2020 21:19    EKG: Independently reviewed. NSR, LVH  Assessment/Plan Active Problems:   Metastasis to brain (HCC)   Hypokalemia   Seizure disorder, focal motor (HCC)   Essential hypertension   Malignant neoplasm of upper-inner quadrant of right breast in female, estrogen receptor positive ([place])  (please populate well all problems here in Problem List. (For example, if patient is on BP meds at home and you resume or decide to hold them, it is a problem that needs to be her. Same for CAD, COPD, HLD and so on)   1. Focal motor seizure - most likely due to increased edema surrounding frontal and temporal lobe brain mets. Plan Keppra increased to 750 mg bid. Did receive loading dose IV in ED  F/U with Dr. Warren Lacy - neuro-oncologist re: any additional treatment  Seizure precautions  2. Hypokalemia - patient has not been taking K replacement. In ED was administered IV andPO potassium Plan Resume daily potassium replacement  3.  HTN- will resume home meds  4. ONcology - follows with Dr. Jana Hakim. Have resumed [place].   DVT  prophylaxis: lovenox  Code Status: DNR based on last admission: patient non-communicative and family no available  Family Communication: attempted to call daughter - no answer. No one present in ED Disposition Plan: TBD  Consults called: Neuro-oncology Dr. Warren Lacy  Admission status: Inpatient    Adella Hare MD [facility] Pager [phone]  If 7PM-7AM, please contact night-coverage [email or web address] Password [facility]  07/22/2020, 12:56 AM

## 2020-07-22 NOTE — ED Notes (Signed)
Pt had another very watery stool.

## 2020-07-22 NOTE — Progress Notes (Signed)
Placed calls to dtr and spouse to inquire if family could bring in [place], they will try an dlocate med in patient's home and bring to hospital.

## 2020-07-23 ENCOUNTER — Other Ambulatory Visit (HOSPITAL_COMMUNITY): Payer: Self-pay | Admitting: Internal Medicine

## 2020-07-23 DIAGNOSIS — G936 Cerebral edema: Secondary | ICD-10-CM

## 2020-07-23 DIAGNOSIS — Z170 Estrogen receptor positive status [ER+]: Secondary | ICD-10-CM

## 2020-07-23 DIAGNOSIS — G9340 Encephalopathy, unspecified: Secondary | ICD-10-CM

## 2020-07-23 DIAGNOSIS — C50211 Malignant neoplasm of upper-inner quadrant of right female breast: Secondary | ICD-10-CM

## 2020-07-23 DIAGNOSIS — G8929 Other chronic pain: Secondary | ICD-10-CM

## 2020-07-23 LAB — URINALYSIS, ROUTINE W REFLEX MICROSCOPIC
Bilirubin Urine: NEGATIVE
Glucose, UA: NEGATIVE mg/dL
Hgb urine dipstick: NEGATIVE
Ketones, ur: NEGATIVE mg/dL
Leukocytes,Ua: NEGATIVE
Nitrite: NEGATIVE
Protein, ur: NEGATIVE mg/dL
Specific Gravity, Urine: 1.024 (ref 1.005–1.030)
pH: 7 (ref 5.0–8.0)

## 2020-07-23 LAB — RAPID URINE DRUG SCREEN, HOSP PERFORMED
Amphetamines: NOT DETECTED
Barbiturates: NOT DETECTED
Benzodiazepines: POSITIVE — AB
Cocaine: NOT DETECTED
Opiates: NOT DETECTED
Tetrahydrocannabinol: POSITIVE — AB

## 2020-07-23 LAB — BASIC METABOLIC PANEL
Anion gap: 10 (ref 5–15)
BUN: 10 mg/dL (ref 8–23)
CO2: 24 mmol/L (ref 22–32)
Calcium: 9.1 mg/dL (ref 8.9–10.3)
Chloride: 111 mmol/L (ref 98–111)
Creatinine, Ser: 0.72 mg/dL (ref 0.44–1.00)
GFR calc Af Amer: >60 mL/min (ref >60)
GFR calc non Af Amer: >60 mL/min (ref >60)
Glucose, Bld: 131 mg/dL — ABNORMAL HIGH (ref 70–99)
Potassium: 3.3 mmol/L — ABNORMAL LOW (ref 3.5–5.1)
Sodium: 145 mmol/L (ref 135–145)

## 2020-07-23 LAB — LEVETIRACETAM LEVEL: Levetiracetam Lvl: 9.7 ug/mL — ABNORMAL LOW (ref 10.0–40.0)

## 2020-07-23 LAB — MAGNESIUM: Magnesium: 1.8 mg/dL (ref 1.7–2.4)

## 2020-07-23 MED ORDER — ENSURE ENLIVE PO LIQD
237.0000 mL | Freq: Three times a day (TID) | ORAL | Status: DC
  Administered 2020-07-23: 237 mL via ORAL

## 2020-07-23 MED ORDER — HEPARIN SOD (PORK) LOCK FLUSH 100 UNIT/ML IV SOLN
500.0000 [IU] | INTRAVENOUS | Status: AC | PRN
  Administered 2020-07-23: 500 [IU]
  Filled 2020-07-23: qty 5

## 2020-07-23 MED ORDER — DIPHENHYDRAMINE HCL 25 MG PO CAPS
25.0000 mg | ORAL_CAPSULE | Freq: Four times a day (QID) | ORAL | Status: DC | PRN
  Administered 2020-07-23: 50 mg via ORAL
  Filled 2020-07-23: qty 2

## 2020-07-23 MED ORDER — TRIAMTERENE-HCTZ 37.5-25 MG PO CAPS: 1.0000 | ORAL_CAPSULE | Freq: Every day | ORAL | 1 refills | Status: DC

## 2020-07-23 MED ORDER — ADULT MULTIVITAMIN W/MINERALS CH
1.0000 | ORAL_TABLET | Freq: Every day | ORAL | Status: DC
  Administered 2020-07-23: 1 via ORAL
  Filled 2020-07-23: qty 1

## 2020-07-23 MED ORDER — POTASSIUM CHLORIDE 20 MEQ PO PACK
40.0000 meq | PACK | Freq: Once | ORAL | Status: AC
  Administered 2020-07-23: 40 meq via ORAL
  Filled 2020-07-23: qty 2

## 2020-07-23 MED ORDER — POTASSIUM CHLORIDE ER 20 MEQ PO TBCR: 1.0000 | EXTENDED_RELEASE_TABLET | Freq: Every day | ORAL | 2 refills | Status: DC

## 2020-07-23 MED ORDER — LEVETIRACETAM 750 MG PO TABS: 750.0000 mg | ORAL_TABLET | Freq: Two times a day (BID) | ORAL | 1 refills | Status: DC

## 2020-07-23 MED ORDER — DEXAMETHASONE 4 MG PO TABS
4.0000 mg | ORAL_TABLET | Freq: Every day | ORAL | 0 refills | Status: DC
Start: 2020-07-23 — End: 2020-08-20

## 2020-07-23 MED FILL — POTASSIUM CHLORIDE 20meqER: 20 | 60 days supply | Qty: 60 | Fill #0

## 2020-07-23 MED FILL — levETIRAcetam 750 MG TABS: 750 | 30 days supply | Qty: 60 | Fill #0

## 2020-07-23 MED FILL — TRIAMTERENE-HCTZ 37.5-25 MG: 37.5-25 | 30 days supply | Qty: 30 | Fill #0

## 2020-07-23 MED FILL — DEXAMETHASONE 4 MG TABLET: 4 | 45 days supply | Qty: 45 | Fill #0

## 2020-07-23 NOTE — Evaluation (Signed)
Physical Therapy Evaluation Patient Details Name: [name] MRN: [id number] DOB: [birth year] Today's Date: 07/23/2020   History of Present Illness  [name] is a 64 y.o. female with medical history significant of  breast cancer with mets to the brain, hypertension, DVT who presents for evaluation of possible seizure activity.  EMS reports that they were called out by family who went to check on her in the bedroom and her eyes and face were twitching    Clinical Impression  Patient received in bed, RN present. States she wants to go home. She has right side upper back pain "where tumors are". She performed bed mobility with mod independence, transfers with hand held assist. Ambulated initially with hand held assist, but was able to drop to just min guard on gait belt for safety with no UE assist. She ambulated 200 feet. No lob, no difficulties noted. She does not require continued PT follow up at this time. Signing off.       Follow Up Recommendations No PT follow up    Equipment Recommendations  Other (comment) (she would like a rollator as she reports she gets tired.)    Recommendations for Other Services       Precautions / Restrictions Precautions Precautions: Fall Precaution Comments: mod fall Restrictions Weight Bearing Restrictions: No      Mobility  Bed Mobility Overal bed mobility: Independent                Transfers Overall transfer level: Needs assistance Equipment used: 1 person hand held assist Transfers: Sit to/from Stand Sit to Stand: Min guard            Ambulation/Gait Ambulation/Gait assistance: Min guard Gait Distance (Feet): 200 Feet Assistive device: None Gait Pattern/deviations: Step-through pattern Gait velocity: WFL   General Gait Details: generally safe, no trouble reported other than she is cold.  Stairs            Wheelchair Mobility    Modified Rankin (Stroke Patients Only)       Balance Overall balance  assessment: Modified Independent Sitting-balance support: Feet supported Sitting balance-Leahy Scale: Good     Standing balance support: No upper extremity supported;During functional activity Standing balance-Leahy Scale: Good Standing balance comment: standing going through bag in closet without assistance. No Lob                             Pertinent Vitals/Pain Pain Assessment: Faces Faces Pain Scale: Hurts little more Pain Location: back "where tumors are" Pain Descriptors / Indicators: Discomfort;Sore Pain Intervention(s): Monitored during session    Home Living Family/patient expects to be discharged to:: Private residence Living Arrangements: Children;Other (Comment);Spouse/significant other Available Help at Discharge: Family;Personal care attendant Type of Home: House Home Access: Level entry     Home Layout: One level Home Equipment: Shower seat;Cane - single point;Walker - 2 wheels Additional Comments: pas PCA 11-3 every day and children are there otherwise.    Prior Function Level of Independence: Independent with assistive device(s)         Comments: uses cane, has walker, but does not use. Reports no falls     Hand Dominance   Dominant Hand: Right    Extremity/Trunk Assessment   Upper Extremity Assessment Upper Extremity Assessment: Overall WFL for tasks assessed    Lower Extremity Assessment Lower Extremity Assessment: Overall WFL for tasks assessed    Cervical / Trunk Assessment Cervical / Trunk Assessment: Normal  Communication   Communication: No difficulties  Cognition Arousal/Alertness: Awake/alert Behavior During Therapy: WFL for tasks assessed/performed Overall Cognitive Status: Within Functional Limits for tasks assessed                                        General Comments      Exercises     Assessment/Plan    PT Assessment Patent does not need any further PT services  PT Problem List          PT Treatment Interventions      PT Goals (Current goals can be found in the Care Plan section)  Acute Rehab PT Goals Patient Stated Goal: to go home today PT Goal Formulation: With patient Time For Goal Achievement: 07/24/20 Potential to Achieve Goals: Good    Frequency     Barriers to discharge        Co-evaluation               AM-PAC PT "6 Clicks" Mobility  Outcome Measure Help needed turning from your back to your side while in a flat bed without using bedrails?: None Help needed moving from lying on your back to sitting on the side of a flat bed without using bedrails?: None Help needed moving to and from a bed to a chair (including a wheelchair)?: None Help needed standing up from a chair using your arms (e.g., wheelchair or bedside chair)?: None Help needed to walk in hospital room?: None Help needed climbing 3-5 steps with a railing? : A Little 6 Click Score: 23    End of Session Equipment Utilized During Treatment: Gait belt Activity Tolerance: Patient tolerated treatment well Patient left: in bed;with call bell/phone within reach Nurse Communication: Mobility status PT Visit Diagnosis: Muscle weakness (generalized) (M62.81);Pain Pain - Right/Left: Right Pain - part of body:  (back)    Time: 9826-4158 PT Time Calculation (min) (ACUTE ONLY): 14 min   Charges:   PT Evaluation $PT Eval Moderate Complexity: 1 Mod          Braylee Lal, PT, GCS 07/23/20,1:26 PM

## 2020-07-23 NOTE — Discharge Summary (Signed)
[name] YQM:[id number] DOB: 1956/08/01 DOA: 07/21/2020  PCP: Libby Maw, MD  Admit date: 07/21/2020 Discharge date: 07/23/2020  Admitted From: Home Disposition: Home  Recommendations for Outpatient Follow-up:  1. Follow up with PCP in 1-2 weeks 2. New medications: Decadron 4 mg daily, Keppra increased to 750 mg twice daily 3. Please obtain BMP (potassium) in one week 4. Please follow up on the following pending results:  Home Health:none  Equipment/Devices: Rollator  Discharge Condition: Stable CODE STATUS: DNR   Brief/Interim Summary: History of present illness:  [name] is a 64 y.o. year old female with medical history significant for breast cancer with mets to the brain, hypertension, DVT  who presented on 07/21/2020 with family reporting and facial twitching and increased confusion, fecal continence and hypersomnolence and was found to have severe hypokalemia, hypomagnesemia as well as persistent edema surrounding known brain metastasis.   Remaining hospital course addressed in problem based format below:   Hospital Course:   Acute encephalopathy, lethargy, somnolence and confusion with reported seizure-like activity, resolved.  Occurred in setting of patient's admitted nonadherence to home [place].  Patient had no seizure-like activity during hospital course and her mental status improved within 24 hours after Keppra loading dose followed by and increasing Keppra dose as recommended by neurology 750 mg twice daily. -Continue Keppra 750 mg twice daily, patient agreeable to continue taking her Keppra -Keppra level pending on discharge  Stage IV breast cancer with frontal temporal lobe brain metastasis (initially noted on MRI on 08/27/2019) status post adjuvant radiation.  CT head here showed right frontal lobe with similar size brain lesion with increase in surrounding edema.  In comparison to CT scan from May consistent with hemorrhagic metastatic lesion with  surrounding edema, with suspected small foci of hemorrhage within the right temporal lobe edema but no evidence of midline shift.  She was given IV Decadron in ED.  Case discussed with Dr. [name] who agreed with transition to oral Decadron and advised continuing Decadron 4 mg daily given patient's improvement neurologically on steroids -Continue Decadron 4 mg daily as recommended by neuro oncology -Per oncology we will discuss her case in tumor board to determine next steps as outpatient this upcoming week (repeat MRI imaging) -Patient was completely back to her normal mental status/neurologic baseline on oral Decadron and reinitiation of home [place] -Patient is followed by Dr. Jana Hakim as outpatient  Hypokalemia and hypomagnesemia, severe, improved greatly.  In setting of taking triamterene/HCTZ without potassium supplementation.  Patient admits to poor adherence to her home medication regimen.  Discussed the importance of taking her medications consistently with patient and her daughter and has been.  Potassium of 2.5 on admission, improved to 3.3 with correction of magnesium. -Can continue transferring HCTZ, new prescription sent in with that as well as potassium supplementation -Advise BMP check with PCP in 1 to 2 weeks   Consultations:  Neurology (by ED physician)  Procedures/Studies: None Subjective:  Discharge Exam: Vitals:   07/23/20 0750 07/23/20 1132  BP: (!) 131/55 138/67  Pulse: (!) 53 (!) 52  Resp: 18 18  Temp: 98.1 F (36.7 C) 98 F (36.7 C)  SpO2: 99% 100%   Vitals:   07/23/20 0400 07/23/20 0500 07/23/20 0750 07/23/20 1132  BP: 131/70  (!) 131/55 138/67  Pulse: (!) 56  (!) 53 (!) 52  Resp: 19  18 18   Temp: 97.8 F (36.6 C)  98.1 F (36.7 C) 98 F (36.7 C)  TempSrc:   Oral Oral  SpO2:  99%  99% 100%  Weight:  79.3 kg    Height:        Awake Alert, Oriented person, place, time, context, follows commands No new F.N deficits,  [place].AT, Normal respiratory effort  on room air, CTAB RRR,No Gallops,Rubs or new Murmurs,  +ve B.Sounds, Abd Soft, No tenderness, No rebound, guarding or rigidity.No Cyanosis, No new Rash or bruise    Discharge Diagnoses:  Active Problems:   Essential hypertension   Malignant neoplasm of upper-inner quadrant of right breast in female, estrogen receptor positive (HCC)   Chronic pain   Metastasis to brain [facility])   Acute encephalopathy   Drug-induced polyneuropathy (HCC)   Hypokalemia   Seizure disorder, focal motor (HCC)   Hypomagnesemia   Cerebral edema [facility])    Discharge Instructions  Discharge Instructions    Diet - low sodium heart healthy   Complete by: As directed    Increase activity slowly   Complete by: As directed      Allergies as of 07/23/2020      Reactions   Tramadol Nausea Only   Hydrocodone-acetaminophen Itching, Rash   Pork-derived Products Other (See Comments)   Pt says she just doesn't eat pork; has no reaction to pork products      Medication List    STOP taking these medications   gabapentin 100 MG capsule Commonly known as: NEURONTIN   torsemide 20 MG tablet Commonly known as: DEMADEX   traZODone 50 MG tablet Commonly known as: DESYREL     TAKE these medications   acetaminophen 325 MG tablet Commonly known as: TYLENOL Take 2 tablets (650 mg total) by mouth every 6 (six) hours as needed for mild pain (or Fever >/= 101).   allopurinol 300 MG tablet Commonly known as: ZYLOPRIM Take 300 mg by mouth daily.   dexamethasone 4 MG tablet Commonly known as: DECADRON Take 1 tablet (4 mg total) by mouth daily.   iron polysaccharides 150 MG capsule Commonly known as: NIFEREX Take 1 capsule (150 mg total) by mouth daily.   levETIRAcetam 750 MG tablet Commonly known as: KEPPRA Take 1 tablet (750 mg total) by mouth 2 (two) times daily.   multivitamin capsule Take 1 capsule by mouth daily.   mupirocin ointment 2 % Commonly known as: BACTROBAN Place 1 application into the nose  2 (two) times daily.   omeprazole 40 MG capsule Commonly known as: PRILOSEC Take 1 capsule (40 mg total) by mouth daily.   ondansetron 4 MG tablet Commonly known as: ZOFRAN Take 1 tablet (4 mg total) by mouth 3 (three) times daily as needed.   ondansetron 4 MG tablet Commonly known as: ZOFRAN Take 1 tablet (4 mg total) by mouth every 6 (six) hours as needed for nausea.   Oxycodone HCl 20 MG Tabs Take 20 mg by mouth 3 (three) times daily as needed.   Potassium Chloride ER 20 MEQ Tbcr Take 1 tablet by mouth daily.   prochlorperazine 10 MG tablet Commonly known as: COMPAZINE Take 1 tablet (10 mg total) by mouth every 6 (six) hours as needed for nausea or vomiting.   sertraline 100 MG tablet Commonly known as: ZOLOFT Take 1 tablet (100 mg total) by mouth daily.   triamterene-hydrochlorothiazide 37.5-25 MG capsule Commonly known as: DYAZIDE Take 1 each (1 capsule total) by mouth daily.   Tukysa 150 MG tablet Generic drug: tucatinib TAKE 2 TABLETS (300 MG TOTAL) BY MOUTH EVERY MORNING. TAKE AS DIRECTED BY MD.  Allergies  Allergen Reactions  . Tramadol Nausea Only  . Hydrocodone-Acetaminophen Itching and Rash  . Pork-Derived Products Other (See Comments)    Pt says she just doesn't eat pork; has no reaction to pork products        The results of significant diagnostics from this hospitalization (including imaging, microbiology, ancillary and laboratory) are listed below for reference.     Microbiology: Recent Results (from the past 240 hour(s))  SARS Coronavirus 2 by RT PCR (hospital order, performed in [facility] hospital lab) Nasopharyngeal Nasopharyngeal Swab     Status: None   Collection Time: 07/22/20  3:53 AM   Specimen: Nasopharyngeal Swab  Result Value Ref Range Status   SARS Coronavirus 2 NEGATIVE NEGATIVE Final    Comment: (NOTE) SARS-CoV-2 target nucleic acids are NOT DETECTED.  The SARS-CoV-2 RNA is generally detectable in upper and  lower respiratory specimens during the acute phase of infection. The lowest concentration of SARS-CoV-2 viral copies this assay can detect is 250 copies / mL. A negative result does not preclude SARS-CoV-2 infection and should not be used as the sole basis for treatment or other patient management decisions.  A negative result may occur with improper specimen collection / handling, submission of specimen other than nasopharyngeal swab, presence of viral mutation(s) within the areas targeted by this assay, and inadequate number of viral copies (<250 copies / mL). A negative result must be combined with clinical observations, patient history, and epidemiological information.  Fact Sheet for Patients:   StrictlyIdeas.no  Fact Sheet for Healthcare Providers: BankingDealers.co.za  This test is not yet approved or  cleared by the [place] FDA and has been authorized for detection and/or diagnosis of SARS-CoV-2 by FDA under an Emergency Use Authorization (EUA).  This EUA will remain in effect (meaning this test can be used) for the duration of the COVID-19 declaration under Section 564(b)(1) of the Act, 21 U.S.C. section 360bbb-3(b)(1), unless the authorization is terminated or revoked sooner.  Performed at [facility], [place] [address] [place], [place] [address]      Labs: BNP (last 3 results) Recent Labs    01/21/20 0907 03/20/20 1145  BNP 27.8 75.6   Basic Metabolic Panel: Recent Labs  Lab 07/21/20 1857 07/22/20 1043 07/22/20 1450 07/23/20 0600  NA 144 145  --  145  K 2.5* 2.9*  --  3.3*  CL 107 110  --  111  CO2 27 20*  --  24  GLUCOSE 97 109*  --  131*  BUN 9 7*  --  10  CREATININE 0.76 0.82  --  0.72  CALCIUM 8.9 9.2  --  9.1  MG 1.5*  --  1.5* 1.8   Liver Function Tests: Recent Labs  Lab 07/21/20 1857  AST 90*  ALT 39  ALKPHOS 70  BILITOT 0.5  PROT 5.6*  ALBUMIN 2.8*   No results for input(s):  LIPASE, AMYLASE in the last 168 hours. No results for input(s): AMMONIA in the last 168 hours. CBC: Recent Labs  Lab 07/21/20 1857  WBC 8.4  NEUTROABS 6.2  HGB 9.7*  HCT 31.9*  MCV 82.6  PLT 289   Cardiac Enzymes: No results for input(s): CKTOTAL, CKMB, CKMBINDEX, TROPONINI in the last 168 hours. BNP: Invalid input(s): POCBNP CBG: Recent Labs  Lab 07/21/20 1719  GLUCAP 77   D-Dimer No results for input(s): DDIMER in the last 72 hours. Hgb A1c No results for input(s): HGBA1C in the last 72 hours. Lipid  Profile No results for input(s): CHOL, HDL, LDLCALC, TRIG, CHOLHDL, LDLDIRECT in the last 72 hours. Thyroid function studies No results for input(s): TSH, T4TOTAL, T3FREE, THYROIDAB in the last 72 hours.  Invalid input(s): FREET3 Anemia work up No results for input(s): VITAMINB12, FOLATE, FERRITIN, TIBC, IRON, RETICCTPCT in the last 72 hours. Urinalysis    Component Value Date/Time   COLORURINE YELLOW 07/23/2020 0020   APPEARANCEUR CLEAR 07/23/2020 0020   LABSPEC 1.024 07/23/2020 0020   PHURINE 7.0 07/23/2020 0020   GLUCOSEU NEGATIVE 07/23/2020 0020   GLUCOSEU NEGATIVE 06/12/2020 1115   HGBUR NEGATIVE 07/23/2020 0020   BILIRUBINUR NEGATIVE 07/23/2020 0020   KETONESUR NEGATIVE 07/23/2020 0020   PROTEINUR NEGATIVE 07/23/2020 0020   UROBILINOGEN 0.2 06/12/2020 1115   NITRITE NEGATIVE 07/23/2020 0020   LEUKOCYTESUR NEGATIVE 07/23/2020 0020   Sepsis Labs Invalid input(s): PROCALCITONIN,  WBC,  LACTICIDVEN Microbiology Recent Results (from the past 240 hour(s))  SARS Coronavirus 2 by RT PCR (hospital order, performed in [place] hospital lab) Nasopharyngeal Nasopharyngeal Swab     Status: None   Collection Time: 07/22/20  3:53 AM   Specimen: Nasopharyngeal Swab  Result Value Ref Range Status   SARS Coronavirus 2 NEGATIVE NEGATIVE Final    Comment: (NOTE) SARS-CoV-2 target nucleic acids are NOT DETECTED.  The SARS-CoV-2 RNA is generally detectable in upper and  lower respiratory specimens during the acute phase of infection. The lowest concentration of SARS-CoV-2 viral copies this assay can detect is 250 copies / mL. A negative result does not preclude SARS-CoV-2 infection and should not be used as the sole basis for treatment or other patient management decisions.  A negative result may occur with improper specimen collection / handling, submission of specimen other than nasopharyngeal swab, presence of viral mutation(s) within the areas targeted by this assay, and inadequate number of viral copies (<250 copies / mL). A negative result must be combined with clinical observations, patient history, and epidemiological information.  Fact Sheet for Patients:   StrictlyIdeas.no  Fact Sheet for Healthcare Providers: BankingDealers.co.za  This test is not yet approved or  cleared by the [place] FDA and has been authorized for detection and/or diagnosis of SARS-CoV-2 by FDA under an Emergency Use Authorization (EUA).  This EUA will remain in effect (meaning this test can be used) for the duration of the COVID-19 declaration under Section 564(b)(1) of the Act, 21 U.S.C. section 360bbb-3(b)(1), unless the authorization is terminated or revoked sooner.  Performed at [facility], [place] [address] [place], [place] [address]      Time coordinating discharge: Over 30 minutes  SIGNED:   Desiree Hane, MD  [facility] 07/23/2020, 1:04 PM Pager   If 7PM-7AM, please contact night-coverage [email or web address] Password [email or web address]

## 2020-07-23 NOTE — TOC Transition Note (Addendum)
Transition of Care [facility]) - CM/SW Discharge Note   Patient Details  Name: [name] MRN: [id number] Date of Birth: July 07, 1956  [facility] CM/SW Contact:  Pollie Friar, RN Phone Number: 07/23/2020, 1:11 PM   Clinical Narrative:    Pt discharging home with self care. No f/u per PT. Pt states she has an aide daily 11am -3pm. She states her daughter then checks on her after work. Spouse is home on the weekends.  Pt states the cancer center provides Lyft transport for her chemo treatments and family provides transport for other needs. At home she has: shower seat, cane, 3 in 1.  MD addressed with her the importance of taking her medications correctly.  Pt with orders for rollator. CM has updated [employer] and they will deliver the DME to the pts room.  Daughter to provide transport home today.   Final next level of care: Home/Self Care Barriers to Discharge: No Barriers Identified   Patient Goals and CMS Choice        Discharge Placement                       Discharge Plan and Services                                     Social Determinants of Health (SDOH) Interventions     Readmission Risk Interventions Readmission Risk Prevention Plan 03/02/2020  Transportation Screening Complete  Medication Review ([place]) Complete  PCP or Specialist appointment within 3-5 days of discharge Complete  HRI or [place] Complete  SW Recovery Care/Counseling Consult Complete  [place] Not Applicable  Some recent data might be hidden

## 2020-07-23 NOTE — Progress Notes (Signed)
Patient discharged to home via private vehicle. Patient given AVS with directions on new medications, bottle of Tukysa 300 mg tablets brought from home this morning and returned back to the patient. At the time of discharge pt was alert and oriented x 3 with no c/o pain or discomfort.

## 2020-07-23 NOTE — Discharge Instructions (Signed)
We discussed the importance of taking the following new medications: Decadron (the steroid to help with the swelling in your brain) 4 mg (1 tab) daily. Keppra (the seizure medication) has had an increased dose of 7050 mg twice daily  I have sent a new refill for your triamterene/hydrochlorothiazide for your blood pressure control.  While you are taking this medication it is important for you to continue taking potassium supplementation to avoid further episodes of low potassium.  It is also important you to follow-up closely with both your primary care doctor and your oncologist.  Above all the most important thing would be to be consistent with your medications as discussed during hospital stay.  New medications: Decadron 1 tab (4 mg) daily  Keppra (seizure medication) 750 mg twice daily  Prescription refill sent for triamterene/hydrochlorothiazide and potassium

## 2020-07-24 ENCOUNTER — Other Ambulatory Visit: Payer: Self-pay

## 2020-07-24 ENCOUNTER — Encounter: Payer: Self-pay | Admitting: Adult Health

## 2020-07-24 ENCOUNTER — Inpatient Hospital Stay: Payer: Medicare Other | Attending: Oncology

## 2020-07-24 ENCOUNTER — Inpatient Hospital Stay (HOSPITAL_BASED_OUTPATIENT_CLINIC_OR_DEPARTMENT_OTHER): Payer: Medicare Other | Admitting: Adult Health

## 2020-07-24 ENCOUNTER — Inpatient Hospital Stay: Payer: Medicare Other

## 2020-07-24 VITALS — BP 128/70 | HR 60 | Temp 98.5°F | Resp 20

## 2020-07-24 VITALS — BP 128/57 | HR 60 | Temp 98.1°F | Resp 17 | Ht 64.0 in | Wt 175.3 lb

## 2020-07-24 DIAGNOSIS — C770 Secondary and unspecified malignant neoplasm of lymph nodes of head, face and neck: Secondary | ICD-10-CM | POA: Diagnosis not present

## 2020-07-24 DIAGNOSIS — C7931 Secondary malignant neoplasm of brain: Secondary | ICD-10-CM

## 2020-07-24 DIAGNOSIS — Z170 Estrogen receptor positive status [ER+]: Secondary | ICD-10-CM

## 2020-07-24 DIAGNOSIS — Z800 Family history of malignant neoplasm of digestive organs: Secondary | ICD-10-CM | POA: Diagnosis not present

## 2020-07-24 DIAGNOSIS — C50211 Malignant neoplasm of upper-inner quadrant of right female breast: Secondary | ICD-10-CM | POA: Diagnosis present

## 2020-07-24 DIAGNOSIS — Z9221 Personal history of antineoplastic chemotherapy: Secondary | ICD-10-CM | POA: Insufficient documentation

## 2020-07-24 DIAGNOSIS — I890 Lymphedema, not elsewhere classified: Secondary | ICD-10-CM | POA: Insufficient documentation

## 2020-07-24 DIAGNOSIS — Z9013 Acquired absence of bilateral breasts and nipples: Secondary | ICD-10-CM | POA: Insufficient documentation

## 2020-07-24 DIAGNOSIS — C50911 Malignant neoplasm of unspecified site of right female breast: Secondary | ICD-10-CM

## 2020-07-24 DIAGNOSIS — Z8616 Personal history of COVID-19: Secondary | ICD-10-CM | POA: Diagnosis not present

## 2020-07-24 DIAGNOSIS — Z8051 Family history of malignant neoplasm of kidney: Secondary | ICD-10-CM | POA: Diagnosis not present

## 2020-07-24 DIAGNOSIS — Z7952 Long term (current) use of systemic steroids: Secondary | ICD-10-CM | POA: Insufficient documentation

## 2020-07-24 DIAGNOSIS — Z5111 Encounter for antineoplastic chemotherapy: Secondary | ICD-10-CM | POA: Diagnosis present

## 2020-07-24 DIAGNOSIS — Z923 Personal history of irradiation: Secondary | ICD-10-CM | POA: Insufficient documentation

## 2020-07-24 DIAGNOSIS — Z171 Estrogen receptor negative status [ER-]: Secondary | ICD-10-CM | POA: Diagnosis not present

## 2020-07-24 DIAGNOSIS — Z87891 Personal history of nicotine dependence: Secondary | ICD-10-CM | POA: Insufficient documentation

## 2020-07-24 DIAGNOSIS — Z86718 Personal history of other venous thrombosis and embolism: Secondary | ICD-10-CM | POA: Insufficient documentation

## 2020-07-24 DIAGNOSIS — C4911 Malignant neoplasm of connective and soft tissue of right upper limb, including shoulder: Secondary | ICD-10-CM

## 2020-07-24 DIAGNOSIS — Z803 Family history of malignant neoplasm of breast: Secondary | ICD-10-CM | POA: Diagnosis not present

## 2020-07-24 DIAGNOSIS — G8929 Other chronic pain: Secondary | ICD-10-CM | POA: Diagnosis not present

## 2020-07-24 DIAGNOSIS — Z5112 Encounter for antineoplastic immunotherapy: Secondary | ICD-10-CM | POA: Diagnosis not present

## 2020-07-24 DIAGNOSIS — Z95828 Presence of other vascular implants and grafts: Secondary | ICD-10-CM

## 2020-07-24 LAB — CBC WITH DIFFERENTIAL (CANCER CENTER ONLY)
Abs Immature Granulocytes: 0.24 10*3/uL — ABNORMAL HIGH (ref 0.00–0.07)
Basophils Absolute: 0 10*3/uL (ref 0.0–0.1)
Basophils Relative: 0 %
Eosinophils Absolute: 0.1 10*3/uL (ref 0.0–0.5)
Eosinophils Relative: 0 %
HCT: 29.5 % — ABNORMAL LOW (ref 36.0–46.0)
Hemoglobin: 9.5 g/dL — ABNORMAL LOW (ref 12.0–15.0)
Immature Granulocytes: 2 %
Lymphocytes Relative: 15 %
Lymphs Abs: 1.7 10*3/uL (ref 0.7–4.0)
MCH: 25.9 pg — ABNORMAL LOW (ref 26.0–34.0)
MCHC: 32.2 g/dL (ref 30.0–36.0)
MCV: 80.4 fL (ref 80.0–100.0)
Monocytes Absolute: 1.2 10*3/uL — ABNORMAL HIGH (ref 0.1–1.0)
Monocytes Relative: 10 %
Neutro Abs: 8.4 10*3/uL — ABNORMAL HIGH (ref 1.7–7.7)
Neutrophils Relative %: 73 %
Platelet Count: 320 10*3/uL (ref 150–400)
RBC: 3.67 MIL/uL — ABNORMAL LOW (ref 3.87–5.11)
RDW: 18.6 % — ABNORMAL HIGH (ref 11.5–15.5)
WBC Count: 11.6 10*3/uL — ABNORMAL HIGH (ref 4.0–10.5)
nRBC: 0 % (ref 0.0–0.2)

## 2020-07-24 LAB — CMP (CANCER CENTER ONLY)
ALT: 33 U/L (ref 0–44)
AST: 39 U/L (ref 15–41)
Albumin: 3.2 g/dL — ABNORMAL LOW (ref 3.5–5.0)
Alkaline Phosphatase: 72 U/L (ref 38–126)
Anion gap: 7 (ref 5–15)
BUN: 15 mg/dL (ref 8–23)
CO2: 26 mmol/L (ref 22–32)
Calcium: 9.1 mg/dL (ref 8.9–10.3)
Chloride: 109 mmol/L (ref 98–111)
Creatinine: 0.92 mg/dL (ref 0.44–1.00)
GFR, Est AFR Am: >60 mL/min (ref >60)
GFR, Estimated: >60 mL/min (ref >60)
Glucose, Bld: 105 mg/dL — ABNORMAL HIGH (ref 70–99)
Potassium: 3.4 mmol/L — ABNORMAL LOW (ref 3.5–5.1)
Sodium: 142 mmol/L (ref 135–145)
Total Bilirubin: 0.3 mg/dL (ref 0.3–1.2)
Total Protein: 6.4 g/dL — ABNORMAL LOW (ref 6.5–8.1)

## 2020-07-24 MED ORDER — ACETAMINOPHEN 325 MG PO TABS
650.0000 mg | ORAL_TABLET | Freq: Once | ORAL | Status: AC
  Administered 2020-07-24: 650 mg via ORAL

## 2020-07-24 MED ORDER — PACLITAXEL PROTEIN-BOUND CHEMO INJECTION 100 MG
100.0000 mg/m2 | Freq: Once | INTRAVENOUS | Status: AC
  Administered 2020-07-24: 200 mg via INTRAVENOUS
  Filled 2020-07-24: qty 40

## 2020-07-24 MED ORDER — TRASTUZUMAB-ANNS CHEMO 150 MG IV SOLR
450.0000 mg | Freq: Once | INTRAVENOUS | Status: AC
  Administered 2020-07-24: 450 mg via INTRAVENOUS
  Filled 2020-07-24: qty 21.43

## 2020-07-24 MED ORDER — PROCHLORPERAZINE MALEATE 10 MG PO TABS
10.0000 mg | ORAL_TABLET | Freq: Once | ORAL | Status: AC
  Administered 2020-07-24: 10 mg via ORAL

## 2020-07-24 MED ORDER — PROCHLORPERAZINE MALEATE 10 MG PO TABS
ORAL_TABLET | ORAL | Status: AC
  Filled 2020-07-24: qty 1

## 2020-07-24 MED ORDER — ACETAMINOPHEN 325 MG PO TABS
ORAL_TABLET | ORAL | Status: AC
  Filled 2020-07-24: qty 2

## 2020-07-24 MED ORDER — SODIUM CHLORIDE 0.9% FLUSH
10.0000 mL | INTRAVENOUS | Status: DC | PRN
  Administered 2020-07-24: 10 mL
  Filled 2020-07-24: qty 10

## 2020-07-24 MED ORDER — SODIUM CHLORIDE 0.9 % IV SOLN
420.0000 mg | Freq: Once | INTRAVENOUS | Status: AC
  Administered 2020-07-24: 420 mg via INTRAVENOUS
  Filled 2020-07-24: qty 14

## 2020-07-24 MED ORDER — SODIUM CHLORIDE 0.9 % IV SOLN
Freq: Once | INTRAVENOUS | Status: DC
  Filled 2020-07-24: qty 250

## 2020-07-24 MED ORDER — SODIUM CHLORIDE 0.9% FLUSH
10.0000 mL | Freq: Once | INTRAVENOUS | Status: AC
  Administered 2020-07-24: 10 mL
  Filled 2020-07-24: qty 10

## 2020-07-24 MED ORDER — ONDANSETRON HCL 4 MG PO TABS: 4.0000 mg | ORAL_TABLET | Freq: Three times a day (TID) | ORAL | 3 refills | Status: DC | PRN

## 2020-07-24 MED ORDER — DIPHENHYDRAMINE HCL 25 MG PO CAPS
25.0000 mg | ORAL_CAPSULE | Freq: Once | ORAL | Status: AC
  Administered 2020-07-24: 25 mg via ORAL

## 2020-07-24 MED ORDER — HEPARIN SOD (PORK) LOCK FLUSH 100 UNIT/ML IV SOLN
500.0000 [IU] | Freq: Once | INTRAVENOUS | Status: AC | PRN
  Administered 2020-07-24: 500 [IU]
  Filled 2020-07-24: qty 5

## 2020-07-24 MED ORDER — SODIUM CHLORIDE 0.9 % IV SOLN
Freq: Once | INTRAVENOUS | Status: AC
  Filled 2020-07-24: qty 250

## 2020-07-24 MED ORDER — DIPHENHYDRAMINE HCL 25 MG PO CAPS
ORAL_CAPSULE | ORAL | Status: AC
  Filled 2020-07-24: qty 1

## 2020-07-24 NOTE — Progress Notes (Addendum)
[name]  Telephone:([phone]) [phone] Fax:(336) [phone]    ID: [name]   DOB: 1956/07/13  MR#: [id number]  LAG#:[id number]  Patient Care Team: [name] Maw, MD as PCP - General (Family Medicine) [name], [name], MD as Consulting Physician (Oncology) [name] Pita, MD as Consulting Physician (Radiation Oncology) [name], [name], MD as Consulting Physician (Cardiology) [name] Skinner, [name], MD as Consulting Physician (Psychiatry) [name] Hale, MD as Consulting Physician (Anesthesiology) [name] Garter, MD as Consulting Physician (Internal Medicine) SU: [name]. [name], [name] 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   She receives Abraxane/trastuzumab/Pertuzumab monthly.  Her most recent treatment was 04/29/2020.    Her most recent echocardiogram on 12/25/2019 showed an ejection fraction of 60-65%.  When she came into our office on 5/14 she was unarousable in clinic.  She was given two 0.40m doses of narcan, and her somnolence remained, so she was taken to the ER and was found to have a right frontal lobe hemorrhagic brain metastases.  She received Dexamethasone and Keppra.   She was scheduled for follow up brain MRI on 04/16/2020 and 04/30/2020, but she did not show for either appointment.  She was admitted to the hospital from 07/21/2020 through 07/23/2020 due to acute encephalopathy, lethargy, somnolence, confusion, and seizures, related to non compliance of Keppra.  She was discharged on Keppra BID and noted she would be compliant with her therapy.  She also had a stage IV frontal temporal lobe brain metastases that was followed with  CT head and showed similar size, and edema present, she was given IV dexamtheasone in the ER and was recommended to take oral dexamethasone.    She is taking Oxycodone, 182mwhich was last prescribed by [name] It was filled on 07/14/2020.     REVIEW OF SYSTEMS: [name] she is now taking her medication.  She is feeling well.  She notes some nausea in the mornings and needs a refill on her anti nausea medication.   She notes some occasional confusion.  She does not have any further dizzy spells.  She is otherwise doing well today and a detailed ROS was otherwise non contributory.   BREAST CANCER HISTORY: From the original intake note:  The patient originally presented in May 2003 when she noticed a lump in the upper inner quadrant of her right breast in September 2003.  She sought attention and had a mammogram which showed an obvious carcinoma in the upper inner quadrant of the right breast, approximately 2 cm.  There were some enlarged lymph nodes in the axilla and an FNA done showed those consistent with malignant cells, most likely an invasive ductal carcinoma.  At that point she was unsure of what to do and was referred by [name]. PeMarylene Buergeror a discussion of her treatment options.  By biopsy, it was ER/PR negative and HER-2 was 3+.  DNA index was 1.42.  We reiterated that it was most important to have her disease surgically addressed at that point and had recommended lumpectomy and axillary nodes to be addressed with which she followed through and on 04-03-02, [name] a right partial mastectomy and a right axillary lymph node dissection.  Final pathology revealed  a 2.4 cm. high grade, Grade III invasive ductal carcinoma with an adjacent .8 cm. also high grade invasive ductal carcinoma which was felt to represent an intramammary metastasis rather than a second primary.  A smaller mass was just medial to the larger mass.  The smaller mass was associated with high  grade DCIS component.  There was no definite lymphovascular invasion identified.  However, one of fourteen axillary lymph nodes did contain a 1.5 cm. metastatic deposit.  Postoperatively she did very well.  We reiterated the fact that she did need adjuvant chemotherapy, however, she refused and decided that she would pursue radiation.  She received radiation and completed that on 08-14-02. Her subsequent history is as detailed below.   PAST MEDICAL HISTORY: Past Medical History:  Diagnosis Date  . Breast cancer ([place])   . DVT (deep venous thrombosis) ([place]) 10/07/2011  . Fever 02/06/2018  . Hypertension   . Radiation 11/29/2005-12/29/2005   right supraclavicular area 4147 cGy  . Radiation 06/26/02-08/14/02   right breast 5040 cGy, tumor bed boosted to 1260 cGy  . Rheumatic fever     PAST SURGICAL HISTORY: Past Surgical History:  Procedure Laterality Date  . BREAST SURGERY    . MASTECTOMY Bilateral   . PORT A CATH REVISION      FAMILY HISTORY Family History  Problem Relation Age of Onset  . Pancreatic cancer Mother   . Kidney cancer Sister 56  . Breast cancer Sister 52  . Breast cancer Other 47  The patient's father died in his 55V  from complications of alcohol abuse. The patient's mother died in her 62s from pancreatic cancer. The patient has 6 brothers, 2 sisters. Both her sisters have had breast cancer, both diagnosed after the age of 78. The patient has not had genetic testing so far.   GYNECOLOGIC HISTORY: Menarche age 61; first live birth age 31. She is GXP4. She is not sure when she stopped having periods. She never used hormone replacement.   SOCIAL HISTORY: The patient has worked in the past as a Psychologist, counselling. At home in addition to the patient are her husband [name], originally from [place], who works as a Administrator.  The other children are [name], who lives with the patient and has a college degree in psychology; she works as a Probation officer; no longer living in the  home is her daughter [name] (she is studying to be a Marine scientist) and grandson.son [name], lives in [place] and works as a Higher education careers adviser. [name] also sometimes stays in the home. In addition the patient has an aide who helps her almost daily.    ADVANCED DIRECTIVES: The patient has completed advanced directives and name her daughter [name] as her healthcare power of attorney.  The patient also completed a living will.  This is notarized.  These documents are being sent for scanning on epic on 01/15/2020   HEALTH MAINTENANCE: Social History   Tobacco Use  . Smoking status: Former Research scientist (life sciences)  . Smokeless tobacco: Never Used  Vaping Use  . Vaping Use: Never used  Substance Use Topics  . Alcohol use: Never    Comment: Rarely  . Drug use: No     Colonoscopy:  Not on file  PAP:  Not on file  Bone density:  Not on file  Lipid panel:  Not on file   Allergies  Allergen Reactions  . Tramadol Nausea Only  . Hydrocodone-Acetaminophen Itching and Rash  . Pork-Derived Products Other (See Comments)  Pt says she just doesn't eat pork; has no reaction to pork products    Current Outpatient Medications  Medication Sig Dispense Refill  . dexamethasone (DECADRON) 4 MG tablet Take 1 tablet (4 mg total) by mouth daily. 45 tablet 0  . levETIRAcetam (KEPPRA) 750 MG tablet Take 1 tablet (750 mg total) by mouth 2 (two) times daily. 60 tablet 1  . Multiple Vitamin (MULTIVITAMIN) capsule Take 1 capsule by mouth daily.      . Oxycodone HCl 20 MG TABS Take 20 mg by mouth 3 (three) times daily as needed.     . Potassium Chloride ER 20 MEQ TBCR Take 1 tablet by mouth daily. 60 tablet 2  . triamterene-hydrochlorothiazide (DYAZIDE) 37.5-25 MG capsule Take 1 each (1 capsule total) by mouth daily. 60 capsule 1  . acetaminophen (TYLENOL) 325 MG tablet Take 2 tablets (650 mg total) by mouth every 6 (six) hours as needed for mild pain (or Fever >/= 101). (Patient not taking: Reported on  07/21/2020)    . allopurinol (ZYLOPRIM) 300 MG tablet Take 300 mg by mouth daily. (Patient not taking: Reported on 06/12/2020)    . iron polysaccharides (NIFEREX) 150 MG capsule Take 1 capsule (150 mg total) by mouth daily. (Patient not taking: Reported on 06/12/2020) 30 capsule 0  . mupirocin ointment (BACTROBAN) 2 % Place 1 application into the nose 2 (two) times daily. (Patient not taking: Reported on 06/12/2020) 22 g 0  . omeprazole (PRILOSEC) 40 MG capsule Take 1 capsule (40 mg total) by mouth daily. (Patient not taking: Reported on 06/12/2020) 30 capsule 1  . ondansetron (ZOFRAN) 4 MG tablet Take 1 tablet (4 mg total) by mouth 3 (three) times daily as needed. (Patient not taking: Reported on 06/12/2020) 20 tablet 3  . ondansetron (ZOFRAN) 4 MG tablet Take 1 tablet (4 mg total) by mouth every 6 (six) hours as needed for nausea. (Patient not taking: Reported on 06/12/2020) 20 tablet 0  . prochlorperazine (COMPAZINE) 10 MG tablet Take 1 tablet (10 mg total) by mouth every 6 (six) hours as needed for nausea or vomiting. (Patient not taking: Reported on 07/21/2020) 30 tablet 1  . sertraline (ZOLOFT) 100 MG tablet Take 1 tablet (100 mg total) by mouth daily. (Patient not taking: Reported on 06/12/2020) 90 tablet 2  . TUKYSA 150 MG tablet TAKE 2 TABLETS (300 MG TOTAL) BY MOUTH EVERY MORNING. TAKE AS DIRECTED BY MD. (Patient not taking: Reported on 07/21/2020) 60 tablet 3   No current facility-administered medications for this visit.   Facility-Administered Medications Ordered in Other Visits  Medication Dose Route Frequency Provider Last Rate Last Admin  . sodium chloride 0.9 % injection 10 mL  10 mL Intravenous PRN [name], [name], MD   10 mL at 03/04/14 1421  . sodium chloride flush (NS) 0.9 % injection 10 mL  10 mL Intracatheter PRN [name], [name], MD   10 mL at 07/30/19 1627    OBJECTIVE: African-American woman examined in a wheelchair  Vitals:   07/24/20 1002  BP: (!) 128/57  Pulse: 60  Resp: 17    Temp: 98.1 F (36.7 C)  SpO2: 100%   Wt Readings from Last 3 Encounters:  07/24/20 175 lb 4.8 oz (79.5 kg)  07/23/20 174 lb 13.2 oz (79.3 kg)  06/26/20 168 lb (76.2 kg)   Body mass index is 30.09 kg/m.   GENERAL: Patient is a well appearing female in no acute distress HEENT:  Sclerae anicteric.  Mask in place. Neck  is supple.  NODES:  No cervical, supraclavicular, or axillary lymphadenopathy palpated.  BREAST EXAM:  Deferred. LUNGS:  Clear to auscultation bilaterally.  No wheezes or rhonchi. HEART:  Regular rate and rhythm. No murmur appreciated. ABDOMEN:  Soft, nontender.  Positive, normoactive bowel sounds. No organomegaly palpated. MSK:  No focal spinal tenderness to palpation. Full range of motion bilaterally in the upper extremities. EXTREMITIES:  No peripheral edema.   SKIN:  Clear with no obvious rashes or skin changes. No nail dyscrasia. NEURO:  Nonfocal. Well oriented.  Appropriate affect.    LAB RESULTS:.  Lab Results  Component Value Date   WBC 11.6 (H) 07/24/2020   NEUTROABS 8.4 (H) 07/24/2020   HGB 9.5 (L) 07/24/2020   HCT 29.5 (L) 07/24/2020   MCV 80.4 07/24/2020   PLT 320 07/24/2020      Chemistry      Component Value Date/Time   NA 145 07/23/2020 0600   NA 142 11/09/2017 1214   K 3.3 (L) 07/23/2020 0600   K 3.9 11/09/2017 1214   CL 111 07/23/2020 0600   CL 101 03/07/2013 1411   CO2 24 07/23/2020 0600   CO2 32 (H) 11/09/2017 1214   BUN 10 07/23/2020 0600   BUN 19.1 11/09/2017 1214   CREATININE 0.72 07/23/2020 0600   CREATININE 1.10 (H) 05/27/2020 0923   CREATININE 1.0 11/09/2017 1214      Component Value Date/Time   CALCIUM 9.1 07/23/2020 0600   CALCIUM 9.1 11/09/2017 1214   ALKPHOS 70 07/21/2020 1857   ALKPHOS 120 11/09/2017 1214   AST 90 (H) 07/21/2020 1857   AST 15 05/27/2020 0923   AST 30 11/09/2017 1214   ALT 39 07/21/2020 1857   ALT 7 05/27/2020 0923   ALT 19 11/09/2017 1214   BILITOT 0.5 07/21/2020 1857   BILITOT 0.2 (L)  05/27/2020 0923   BILITOT <0.22 11/09/2017 1214       STUDIES: CT Head Wo Contrast  Result Date: 07/21/2020 CLINICAL DATA:  Seizure EXAM: CT HEAD WITHOUT CONTRAST TECHNIQUE: Contiguous axial images were obtained from the base of the skull through the vertex without intravenous contrast. COMPARISON:  CT brain and MRI brain 03/20/2020, CT 02/10/2020 FINDINGS: Brain: Hematocrit level in the right frontal lobe consistent with hemorrhagic metastatic lesion. Surrounding edema within the right frontal lobe. Considerably increased edema within the right temporal lobe at the site of known metastatic lesion. Suspected small hyperdense foci within the temporal lobe edema raises concern for small foci of hemorrhage. No midline shift. Stable ventricle size. Vascular: No hyperdense vessels.  No unexpected calcification Skull: Normal. Negative for fracture or focal lesion. Sinuses/Orbits: Fluid in the left mastoid air cells. Sinuses are clear Other: None. IMPRESSION: 1. Hematocrit level in the right frontal lobe consistent with hemorrhagic metastatic lesion with surrounding edema in the right frontal lobe, this finding was present on prior CT from May. Considerable increase in edema within the right temporal lobe at the site of known metastatic lesion. Suspected small foci of hemorrhage within the right temporal lobe edema. No midline shift. 2. Fluid in the left mastoid air cells. Critical Value/emergent results were called by telephone at the time of interpretation on 07/21/2020 at 9:19 pm to provider [facility] , who verbally acknowledged these results. Electronically Signed   By: Donavan Foil [name]   On: 07/21/2020 21:19     ASSESSMENT:  64 y.o. [place] woman with stage IV breast cancer manifested chiefly by loco-regional nodal disease (neck, chest) and skin involvement, without  liver, bone, or brain metastases documented   (1) Status post right upper inner quadrant lumpectomy and sentinel lymph node  sampling 03/04/2002 for 2 separate foci of invasive ductal carcinoma, mpT2 pN1 or stage IIB, both foci grade 3, both estrogen and progesterone receptor positive, both HercepTest 3+, Mib-1 56%  (2) Reexcision for margins 05/27/2002 showed no residual cancer in the breast.  (3) The patient refused adjuvant systemic therapy.  (4) Adjuvant radiation treatment completed 08/14/2002.  (5) recurrence in the right breast in 02/2004 showing a morphologically different tumor, again grade 3, again estrogen and progesterone receptor negative, with an MIB-1 of 14% and Herceptest 3+.  (5) Between 03/2004 and 07/2004 she received dose dense Doxorubicin/Cyclophosphamide x 4 given with trastuzumab, followed by weekly Docetaxel x 8, again given with trastuzumab.  (6) Right mastectomy 07/13/2004 showed scattered microscopic foci of residual disease over an area  greater than 5 cm. Margins were negative.  (7) Postoperative Docetaxel continued until 09/2004.  (8) Trastuzumab (Herceptin) given 08/2004 through 01/2012 with some brief interruptions.  RECURRENT/ STAGE IV DISEASE December 2006 (9) Isolated right cervical nodal recurrence 10/2005, treated with radiation to the right supraclavicular area (total 41.5 gray) completed 12/29/2005.  (10) Navelbine given together with Herceptin  11/2005 through 03/2006.  (9) Left mastectomy 02/13/2006 for ductal carcinoma in situ, grade 2, estrogen and progesterone receptor negative, with negative margins; 0 of 3 lymph nodes involved  (10) treated with Lapatinib and Capecitabine before 10/2009, for an unclear duration and with unclear results (cannot locate data on chart review).  (11) Status post right supraclavicular lymph node biopsy 09/2010 again positive for an invasive ductal carcinoma, estrogen and progesterone receptor negative, HER-2 positive by CISH with a ratio 4.25.  (12) Navelbine given together with Herceptin between 05/2011 and 11/2011.  (13) Carboplatin/  Gemcitabine/ Herceptin given for 2 cycles, in 12/2011 and 01/2012.  (14) TDM-1 ([place]) started 02/2012. Last dose 10/02/2013 after which the patient discontinued treatment at her own discretion. Echo on December 2014 showed a well preserved ejection fraction.  (15) Deep vein thrombosis of the right upper extremity documented 04/20/2011.  She completed anticoagulant therapy with Coumadin on 03/25/2013.  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 Left chest Port-A-Cath placed on 04/08/2013; being flushed every 6 weeks  RIGHT UPPER EXTREMITY ANGIOSARCOMA VS BREAST CANCER: August 2016 (18) treated at [facility] of [place] August 2016 with paclitaxel, trastuzumab and pertuzumab 1 cycle, afterwards referred to hospice  (19) under the care of hospice of [facility] August 2016 to 05/08/2016, when the patient opted for a second try at chemotherapy  (20) started low-dose Abraxane, trastuzumab and pertuzumab 06/07/2016, to be repeated every 4 weeks.   (a) pretreatment echocardiogram 06/06/2016 showed a 60-65% ejection fraction  (b) significant compliance problems compromise response to treatment  (c)  echocardiogram 02/20/17 LVEF 55-60%  (d) Abraxane discontinued 07/04/2017 because of possible neuropathy symptoms  (e) Abraxane resumed 09/27/2017  (f) echocardiogram 07/20/2017 showed an ejection fraction of 60-65%  (g) echo 01/22/2018 showed an ejection fraction of 50-55%  (h) CT scan of the chest 05/03/2018 shows no disease involving the lungs liver or lymph nodes, with stable subcutaneous masses as previously noted  (I) Abraxane discontinued August 2019 because of concerns regarding neuropathy-- gemcitabine substituted  (j) echocardiogram 08/20/2018 shows an  ejection  fraction in the 55-60% range (done every 6 months)  (k) gemcitabine discontinued because of multiple symptoms, Abraxane restarted 09/11/2018, given once every 4 weeks  (l) echocardiogram 07/17/2019 shows a well-preserved ejection fraction at 55-60%  (m) restaging PET scan 08/20/2019 shows hypermetabolic adenopathy, subcutaneous malignancy as clinically appreciated, but no liver or lung parenchymal lesions   (21) Chronic pain secondary to known metastatic disease  (a) patient signed pain contract 10/19/2016  (b) as of 11/16/2016 receives her pain medications from [name] [name]  (c)  [name] [name] no longer able to prescribe as of February 2019  (d) all pain medications now through [facility], [name] Mirna Mires (as of April 2019)  (22) left breast and left axillary adenopathy noted clinically  (a) left axillary lymph node biopsy on 07/24/2019 benign  (23) BRAIN METASTASES: frontal and temporal lobe metastases noted on brain MRI 09/04/2019  (a) adjuvant radiation 09/11/2019-10/16/2019:    The brain was treated to 30 Gy in 10 fractions.   (b) tucatinib prescribed December 2020 at 150 mg daily because of methadone interaction, finally started March 2021 but taken irregularly by the patient.  (c) noncontrast head CT 01/12/2020 stable lesions/improved edema  (d) brain MRI 02/01/2020 shows decreased size of the right frontal and temporal lobe metastases with mild residual edema, no mass-effect, and no evidence of new mets  (e) noncontrast MRI of the cervical, thoracic, and lumbar spine 02/11/2020 and 02/12/2020 showed no metastatic disease  (24) COVID-19 infection 2019-11-19   PLAN: [name] is here today for f/u and treatment of her metastatic breast cancer.  She will proceed with treatment with Abraxane/trastuzumab/Pertuzumab.  She is tolerating this well, and will continue it.  She is due for an echo and I have ordered it, and my nurse scheduled it and gave her the appointment  date/time.  I encouraged her to keep this appointment.    I recommended continued compliance for [place] with her Keppra and Dexamethasone.  I refilled her Zofran PRN at her request.  We discussed how the medication is working to help prevent her from having neurologic symptoms from the metastatic tumor in her brain.  She notes that she understands this.  She met with [name]. Jana Hakim to review her hospitalization and her future plan.  He will see her back in 4 weeks for f/u.  She knows to call for any questions that may arise between now and her next appointment.  We are happy to see her sooner if needed.   Total encounter time 30 minutes.Wilber Bihari, NP 07/24/20 10:16 AM Medical Oncology and Hematology [facility] [place] [place], [place] [address] Tel. [phone]    Fax. [phone]   ADDENDUM: [name] is now 14-1/2 years out from definitive diagnosis of metastatic breast cancer.  In addition to systemic disease chiefly involving the subcutaneous areas in the right arm and back, she has central nervous system disease.  As far as her systemic disease is concerned this continues to be clinically controlled on her current treatment, of anti-HER-2 agents and Abraxane.  She tolerates this well and she is receiving it every 28 days.  I do not see any reason to discontinue it at present.  The central nervous system concern is followed by [name]. [name].  At this point I do not see a follow-up with him.  Brain MRI has been ordered but I do not see that it has been scheduled.  We discussed compliance with [name] and she appears to be  taking her dexamethasone and Keppra appropriately right now.  She has a repeat echocardiogram scheduled for 07/28/2020   I personally saw this patient and performed a substantive portion of this encounter with the listed APP documented above.   Chauncey Cruel, MD Medical Oncology and Hematology [facility] [address] [place], [place] [address] Tel. [phone]    Fax. [phone]      *Total Encounter Time as defined by the Centers for Medicare and Medicaid Services includes, in addition to the face-to-face time of a patient visit (documented in the note above) non-face-to-face time: obtaining and reviewing outside history, ordering and reviewing medications, tests or procedures, care coordination (communications with other health care professionals or caregivers) and documentation in the medical record.

## 2020-07-24 NOTE — Progress Notes (Signed)
Per Wilber Bihari NP called and scheduled patient for an echo. It is scheduled for Tuesday 07/28/20 @10am  here at [facility]. Patient made aware of appointment date and time by infusion nurse Benjie Karvonen RN

## 2020-07-24 NOTE — Patient Instructions (Signed)
[facility] Discharge Instructions for Patients Receiving Chemotherapy  Today you received the following chemotherapy agents: trastuzumab, pertuzumab, and abraxane.  To help prevent nausea and vomiting after your treatment, we encourage you to take your nausea medication as directed.   If you develop nausea and vomiting that is not controlled by your nausea medication, call the clinic.   BELOW ARE SYMPTOMS THAT SHOULD BE REPORTED IMMEDIATELY:  *FEVER GREATER THAN 100.5 F  *CHILLS WITH OR WITHOUT FEVER  NAUSEA AND VOMITING THAT IS NOT CONTROLLED WITH YOUR NAUSEA MEDICATION  *UNUSUAL SHORTNESS OF BREATH  *UNUSUAL BRUISING OR BLEEDING  TENDERNESS IN MOUTH AND THROAT WITH OR WITHOUT PRESENCE OF ULCERS  *URINARY PROBLEMS  *BOWEL PROBLEMS  UNUSUAL RASH Items with * indicate a potential emergency and should be followed up as soon as possible.  Feel free to call the clinic should you have any questions or concerns. The clinic phone number is [phone].  Please show the [place] at check-in to the Emergency Department and triage nurse.

## 2020-07-24 NOTE — Patient Instructions (Signed)

## 2020-07-27 ENCOUNTER — Telehealth: Payer: Self-pay | Admitting: Hematology and Oncology

## 2020-07-27 NOTE — Telephone Encounter (Signed)
Scheduled per 9/17 los. Left voicemail with appt dates and times.

## 2020-07-28 ENCOUNTER — Inpatient Hospital Stay (HOSPITAL_COMMUNITY): Admission: RE | Admit: 2020-07-28 | Payer: Medicare Other | Source: Ambulatory Visit

## 2020-08-03 ENCOUNTER — Telehealth: Payer: Self-pay | Admitting: Hospice

## 2020-08-03 DIAGNOSIS — Z515 Encounter for palliative care: Secondary | ICD-10-CM

## 2020-08-03 IMAGING — CT CT HEAD W/O CM
3 series · 15 of 47 positions shown, 18 images · non-contrast
Comparison: 02/10/2020

CLINICAL DATA: Altered mental status, metastatic breast cancer

EXAM:
CT HEAD WITHOUT CONTRAST
TECHNIQUE: Contiguous axial images were obtained from the base of the skull
through the vertex without intravenous contrast.

[Series 2: head wo · axial · 0.47mm/px · z∈[+1337,+1462]mm · 9 of 31 slices shown, 12 images]
[im 3/31  brain]
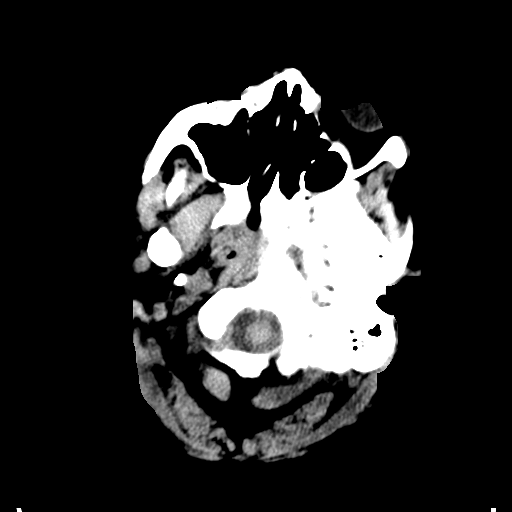
[im 3/31  bone]
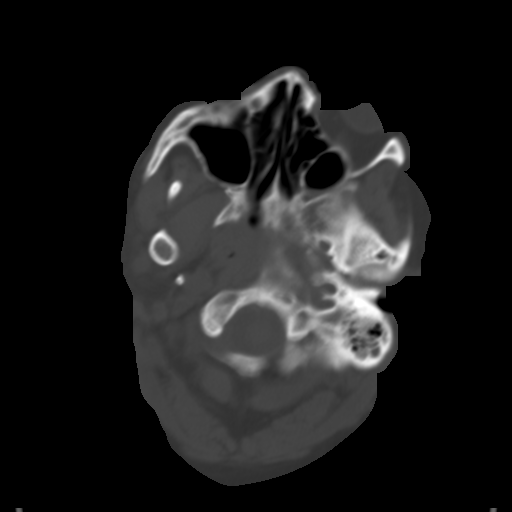
[im 6/31  brain]
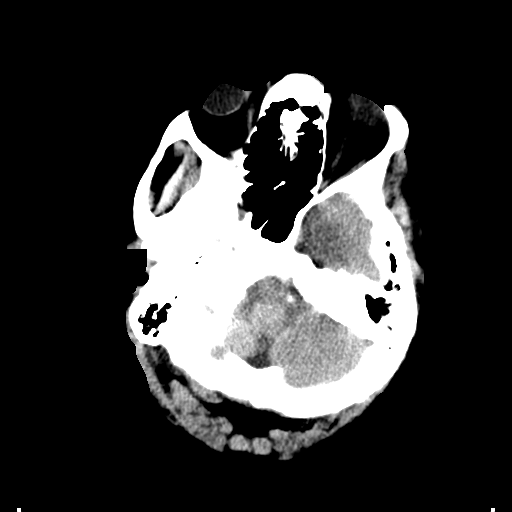
[im 9/31  brain]
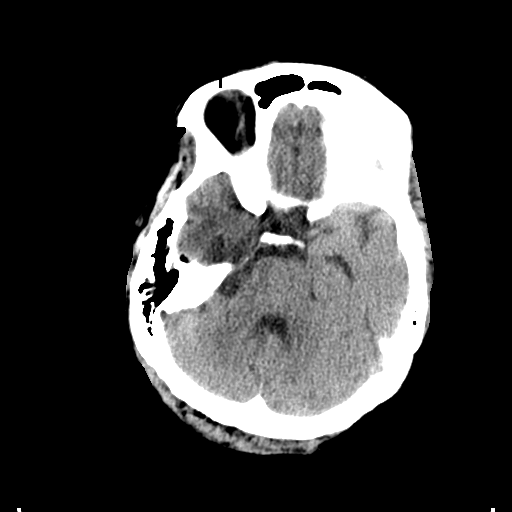
[im 12/31  brain]
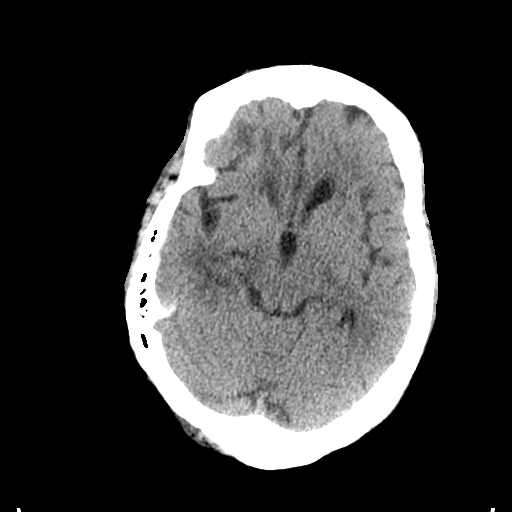
[im 16/31  brain]
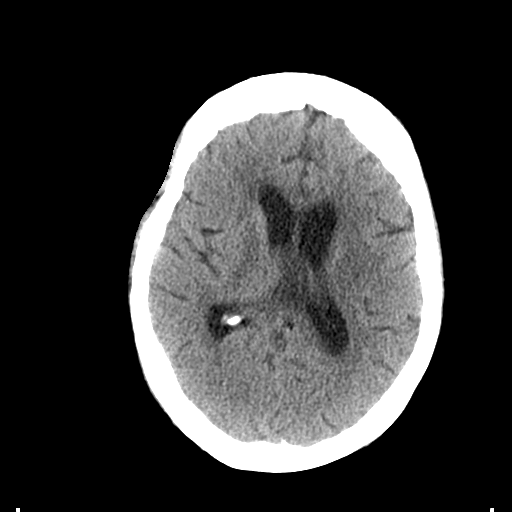
[im 16/31  bone]
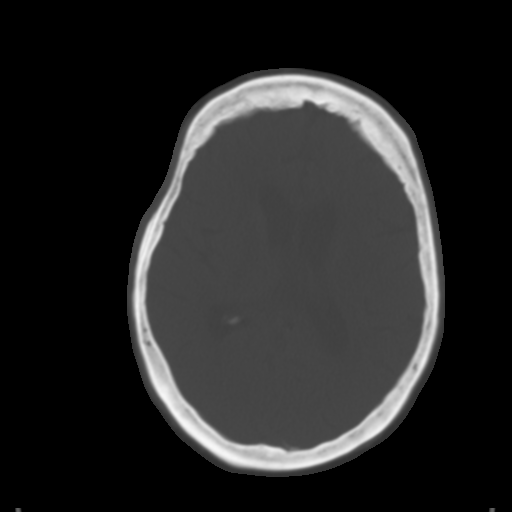
[im 19/31  brain]
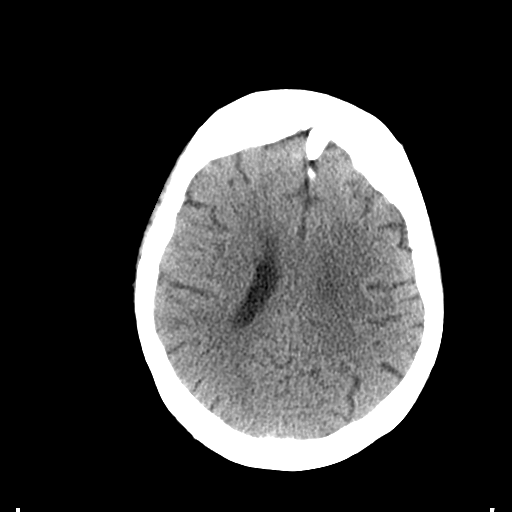
[im 22/31  brain]
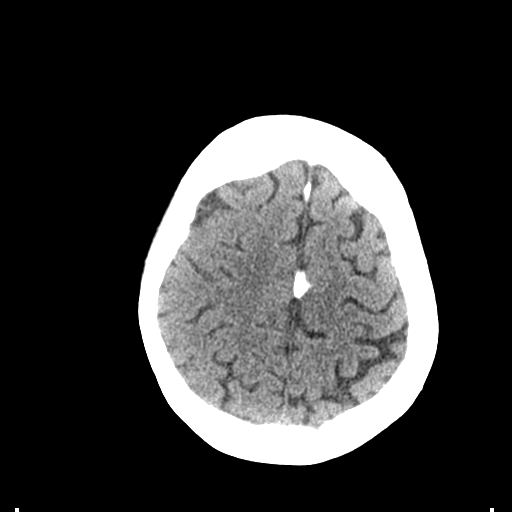
[im 25/31  brain]
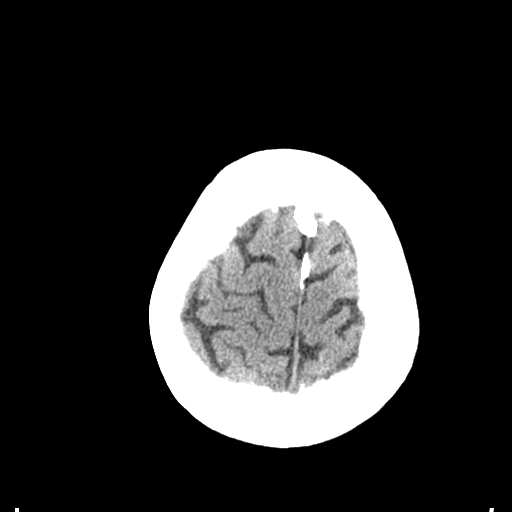
[im 28/31  brain]
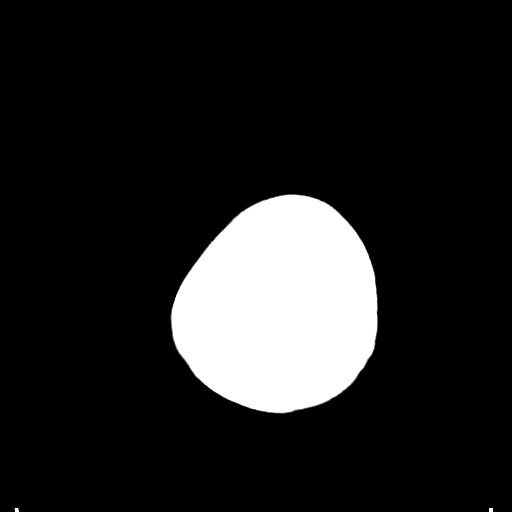
[im 28/31  bone]
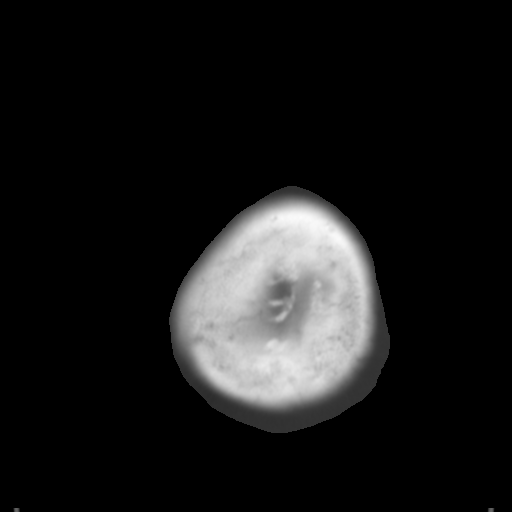

[Series 5: coronal soft tissue · coronal · 0.30mm/px · 3 of 77 slices shown]
[im 26/77  brain]
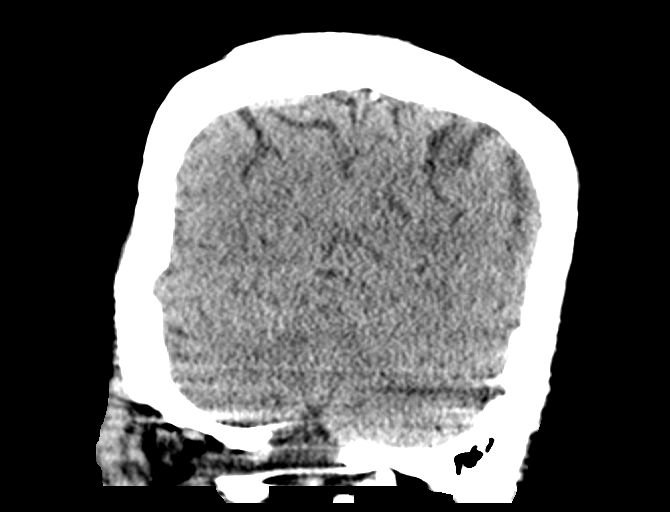
[im 34/77  brain]
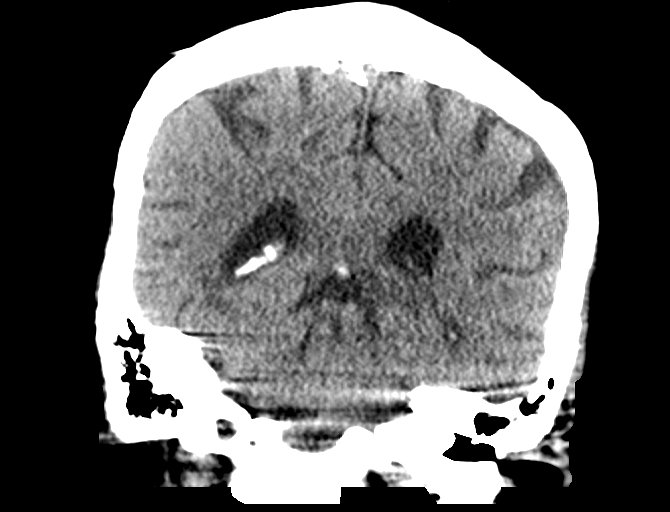
[im 43/77  brain]
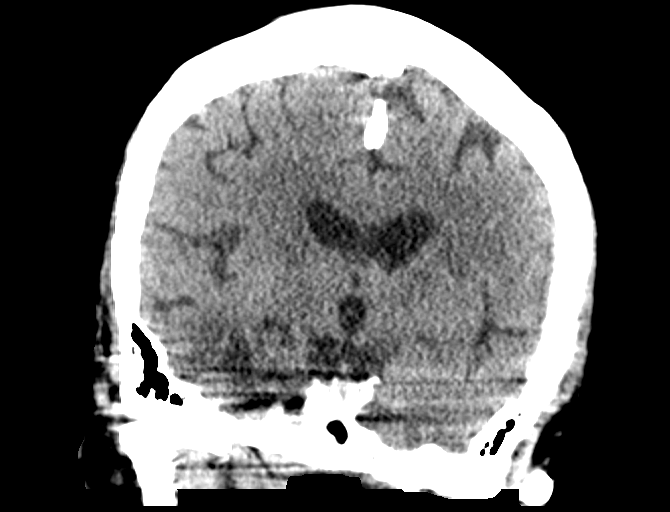

[Series 6: sagittal soft tissue · sagittal · 0.33mm/px · 3 of 65 slices shown]
[im 22/65  brain]
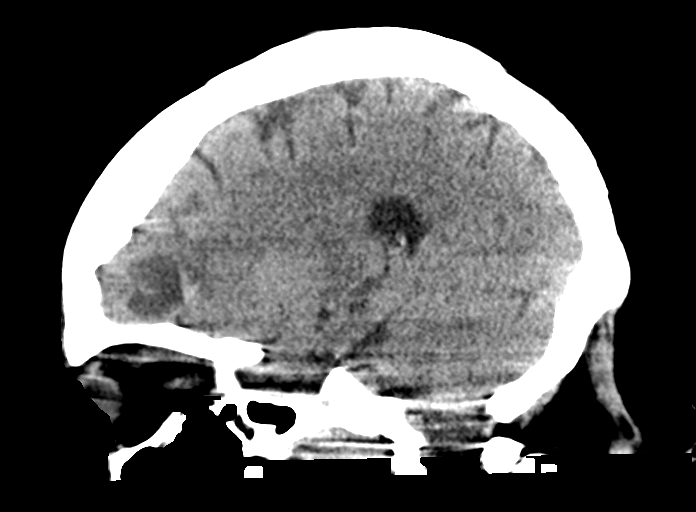
[im 33/65  brain]
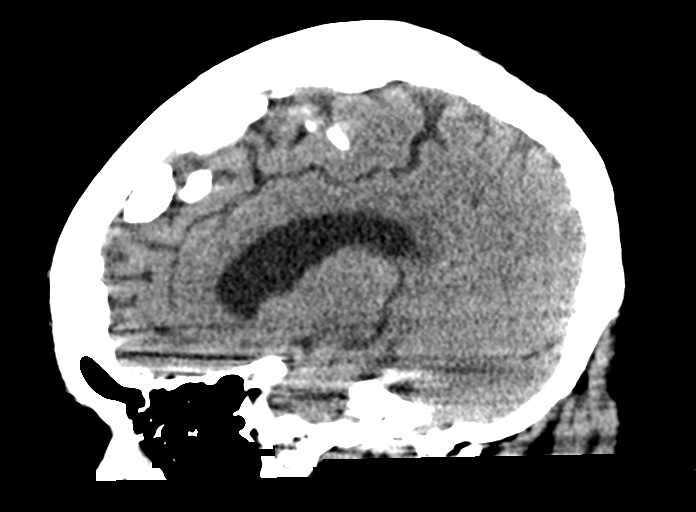
[im 43/65  brain]
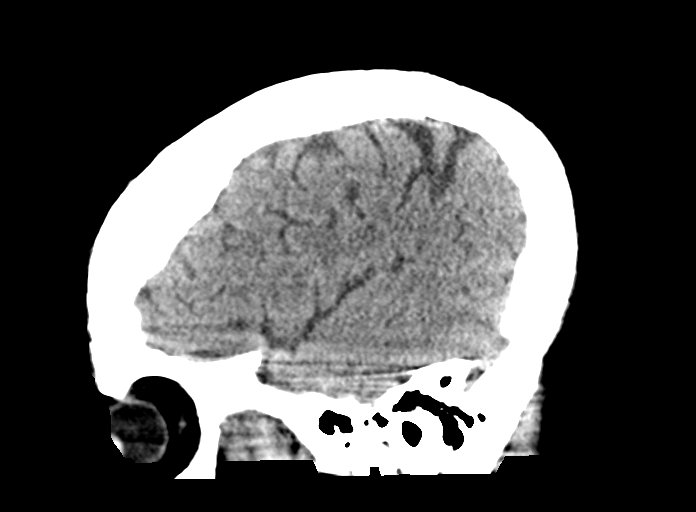

[15 of 47 positions shown; findings below may reference images not displayed]

FINDINGS: Brain: There is new hyperdensity associated with the right frontal
metastasis reflecting small volume intralesional hemorrhage. There
is unchanged hypoattenuation in the right temporal lobe at the site
of other known metastasis.

There is no significant mass effect. Ventricles are stable in size
and configuration. No new loss of gray-white differentiation.

Vascular: Negative.

Skull: Calvarium is unremarkable.

Sinuses/Orbits: No acute finding.

Other: None.
IMPRESSION: Small volume intralesional hemorrhage within treated right frontal
metastasis. No significant mass effect.

These results were called by telephone at the time of interpretation
on 03/20/2020 at [DATE] to provider MISILIGA HARCOURTS FIJI , who verbally
acknowledged these results.

## 2020-08-03 IMAGING — DX DG CHEST 1V PORT
1 series · 1 of 1 positions shown · non-contrast
Comparison: 02/10/2020

CLINICAL DATA: Altered mental status

EXAM:
PORTABLE CHEST 1 VIEW

[chest ap]
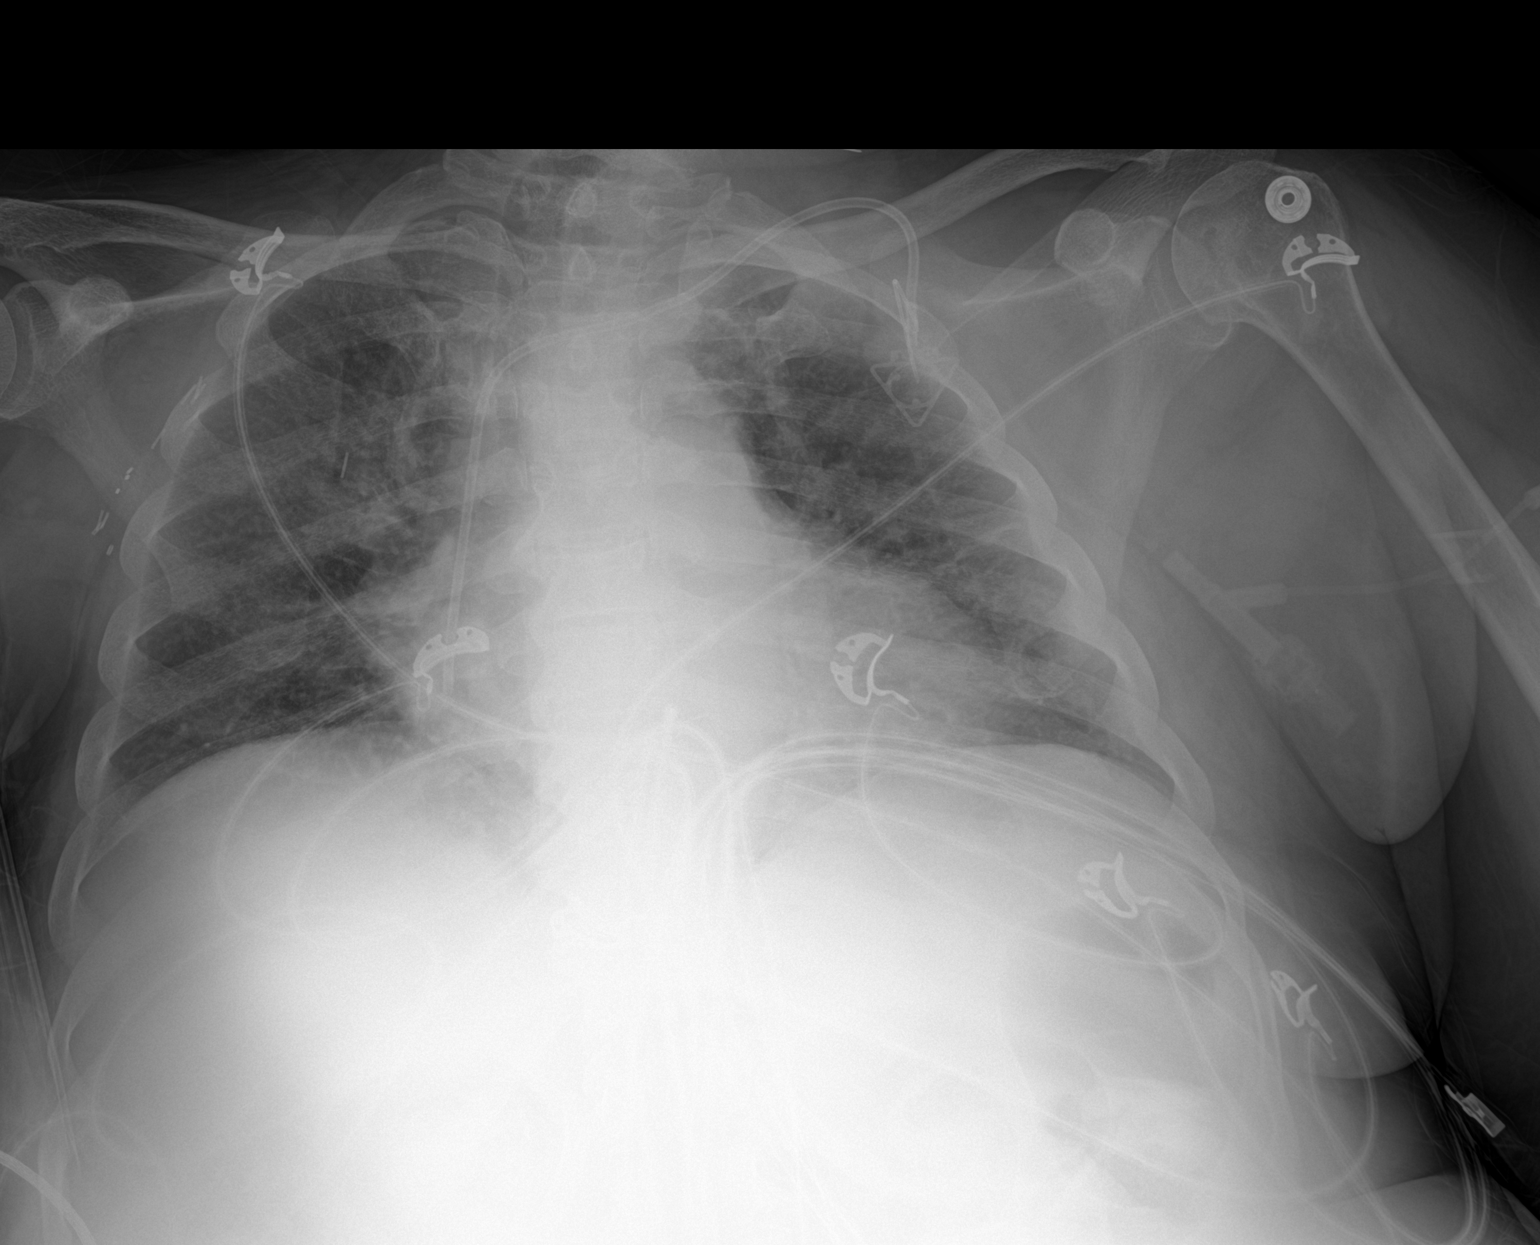

[1 of 1 positions shown; findings below may reference images not displayed]

FINDINGS: Left chest wall port catheter is unchanged. Low lung volumes. Mild
interstitial prominence. No significant pleural effusion. No
pneumothorax. Cardiomediastinal contours are likely within normal
limits for technique.
IMPRESSION: Low lung volumes. Nonspecific mild interstitial prominence, which
could be chronic or reflect mild superimposed edema.

## 2020-08-03 IMAGING — MR MR HEAD WO/W CM
12 of 13 series · 34 of 48 positions shown · IV contrast (gadavist)
Comparison: Head CT 03/20/2020 and MRI 01/31/2020

CLINICAL DATA: Encephalopathy. Metastatic breast cancer.

EXAM:
MRI HEAD WITHOUT AND WITH CONTRAST
TECHNIQUE: Multiplanar, multiecho pulse sequences of the brain and surrounding
structures were obtained without and with intravenous contrast.
CONTRAST:  10 mL Gadavist

[Series 5: dwi_tracew · axial · 3.0mm · 1.08mm/px · 1 of 106 slices shown]
[im 1/106]
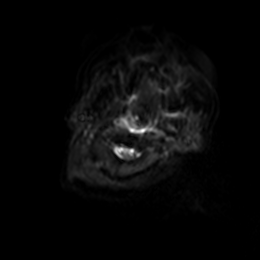

[Series 7: T2 · sagittal · 5.0mm · 0.47mm/px · 2 of 25 slices shown (1 of 2)]
[im 1/25]
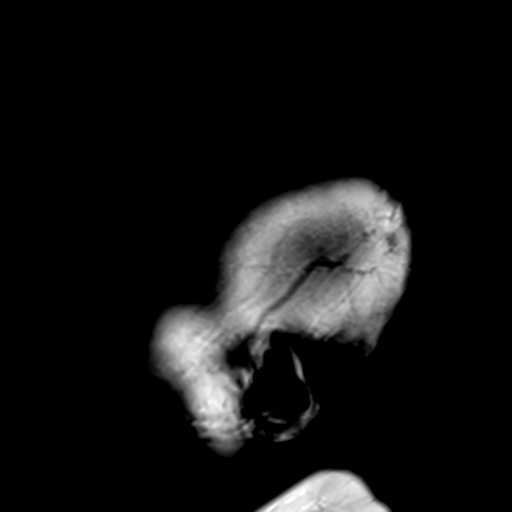
[im 25/25]
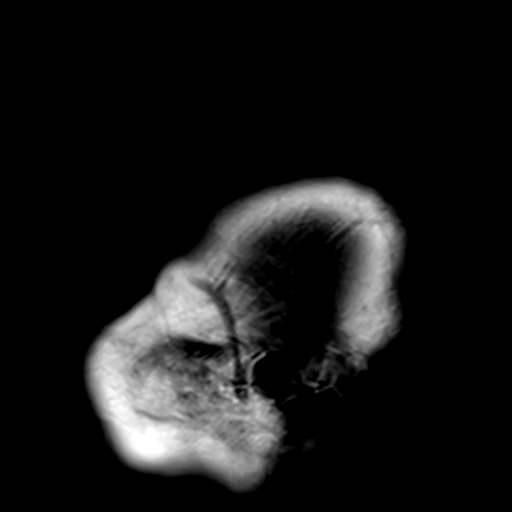

[Series 8: T2 · axial · 5.0mm · 0.45mm/px · z∈[-3,+140]mm · 2 of 25 slices shown (2 of 2)]
[im 1/25]
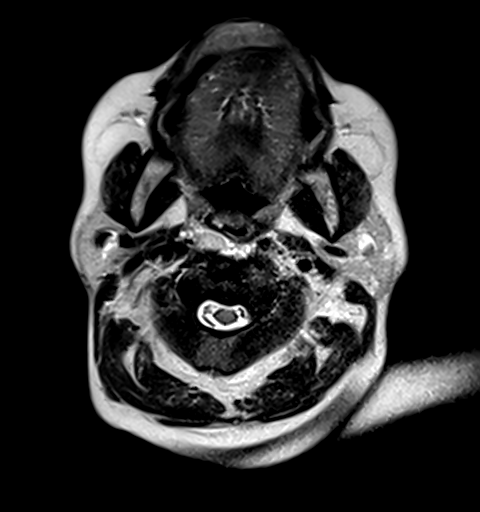
[im 25/25]
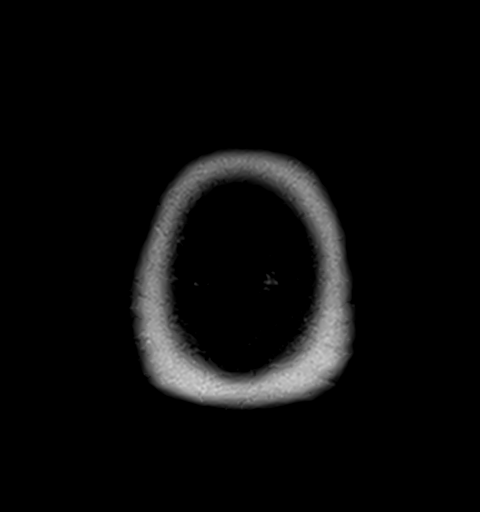

[Series 9: GRE · axial · 3.0mm · 0.45mm/px · z∈[+11,+148]mm · 4 of 48 slices shown]
[im 1/48]
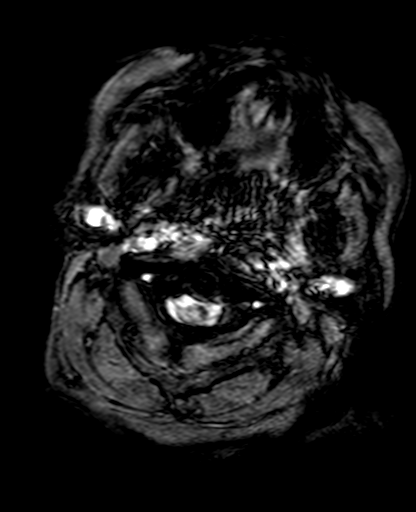
[im 16/48]
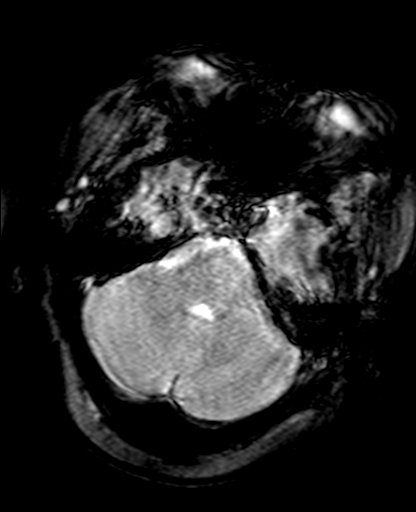
[im 32/48]
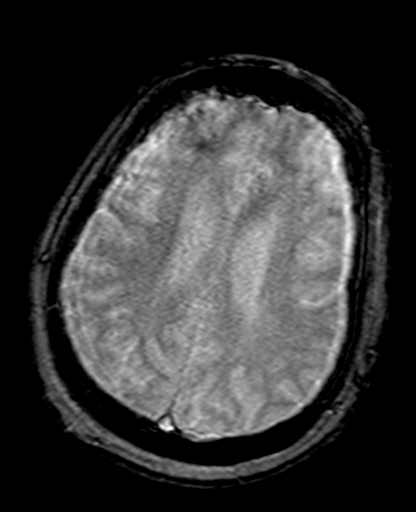
[im 48/48]
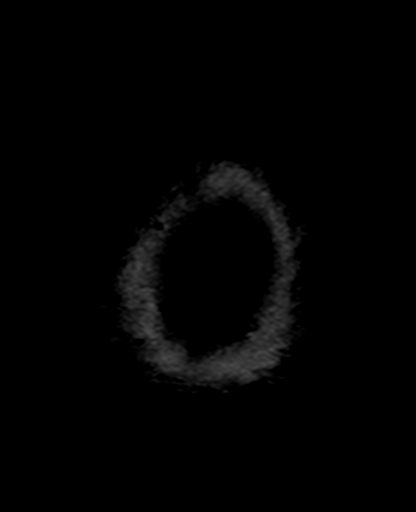

[Series 10: FLAIR · axial · 3.0mm · 0.86mm/px · z∈[-7,+130]mm · 4 of 51 slices shown]
[im 1/51]
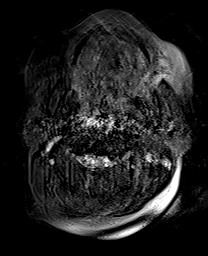
[im 17/51]
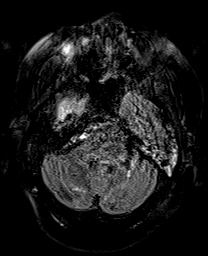
[im 34/51]
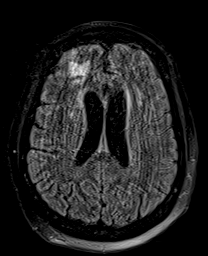
[im 51/51]
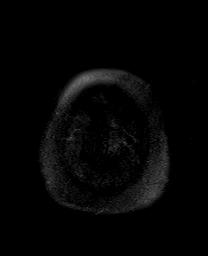

[Series 11: T1 · axial · 3.0mm · 0.45mm/px · z∈[+11,+151]mm · 4 of 53 slices shown]
[im 1/53]
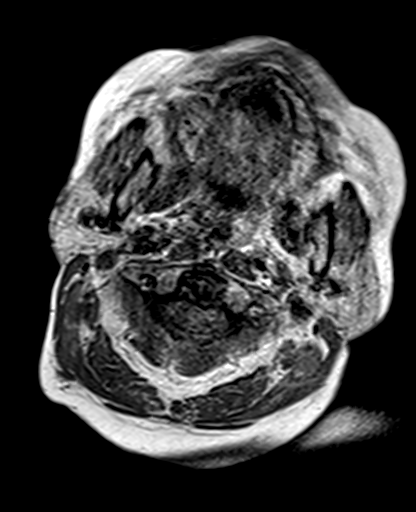
[im 18/53]
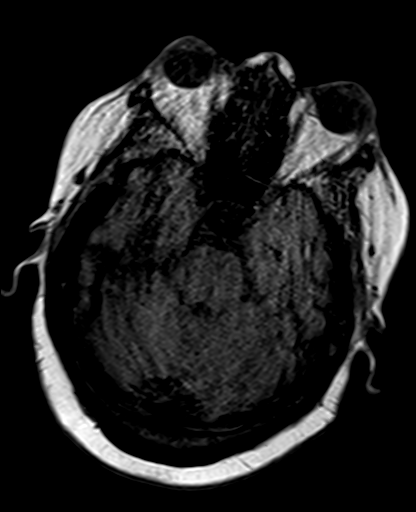
[im 35/53]
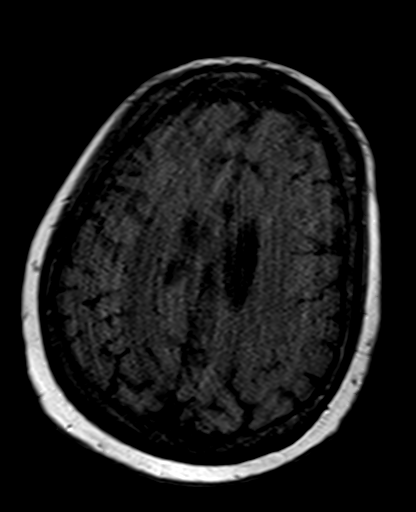
[im 53/53]
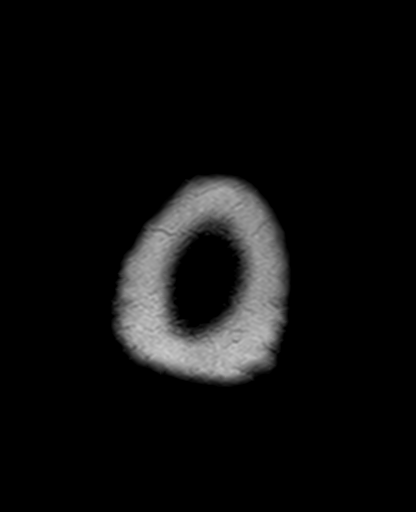

[Series 12: DWI · coronal · 5.0mm · 1.31mm/px · 5 of 56 slices shown (1 of 2)]
[im 1/56]
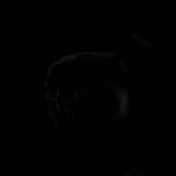
[im 14/56]
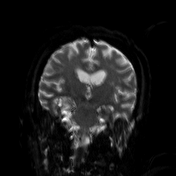
[im 28/56]
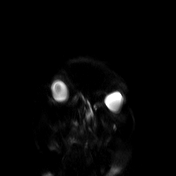
[im 42/56]
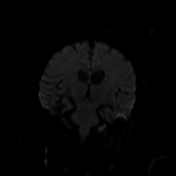
[im 56/56]
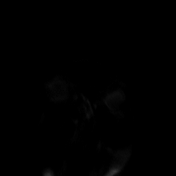

[Series 13: DWI · coronal · 5.0mm · 1.31mm/px · 2 of 28 slices shown (2 of 2)]
[im 1/28]
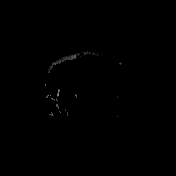
[im 28/28]
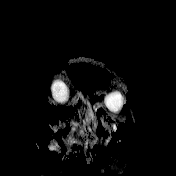

[Series 14: T2 post-contrast · coronal · 5.0mm · 0.86mm/px · 2 of 28 slices shown]
[im 1/28]
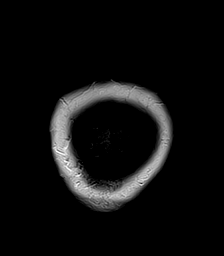
[im 28/28]
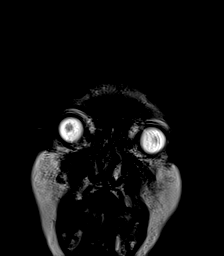

[Series 15: T1 post-contrast · axial · 3.0mm · 0.45mm/px · z∈[+8,+148]mm · 4 of 53 slices shown (1 of 3)]
[im 1/53]
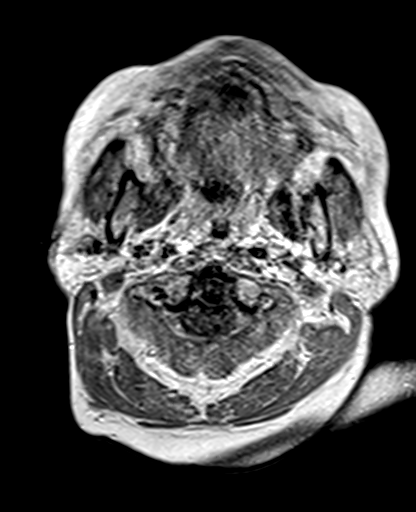
[im 18/53]
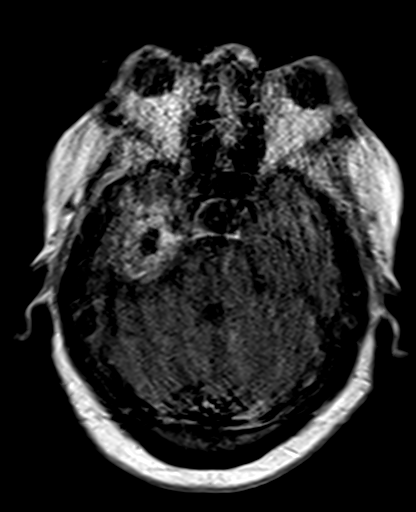
[im 35/53]
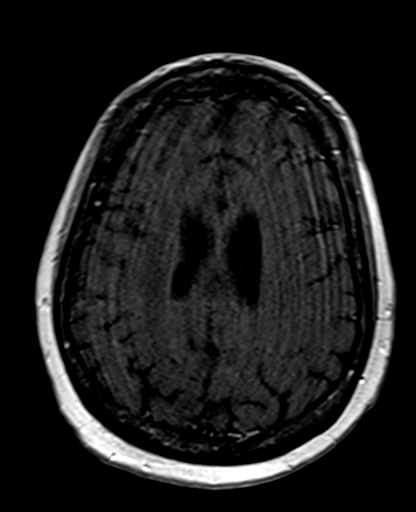
[im 53/53]
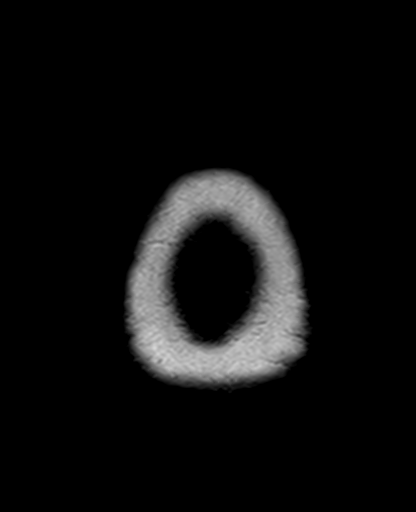

[Series 16: T1 post-contrast · coronal · 5.0mm · 0.43mm/px · 2 of 28 slices shown (2 of 3)]
[im 1/28]
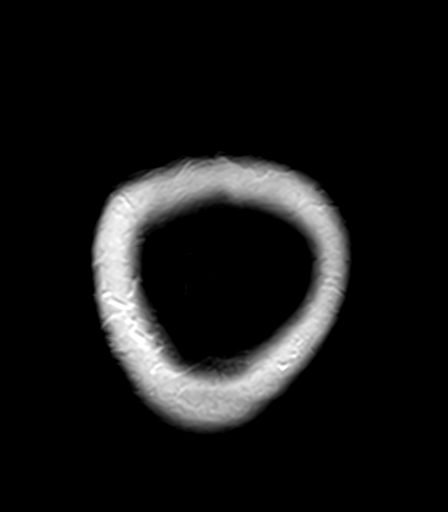
[im 28/28]
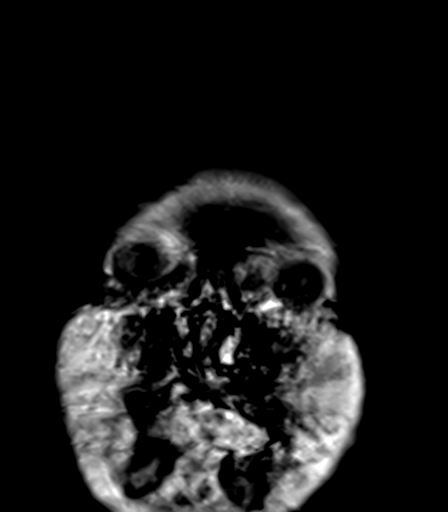

[Series 17: T1 post-contrast · sagittal · 5.0mm · 0.94mm/px · 2 of 25 slices shown (3 of 3)]
[im 1/25]
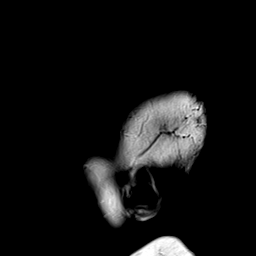
[im 25/25]
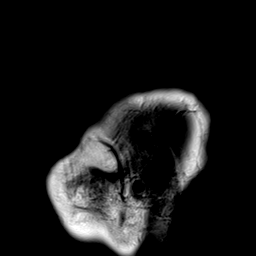

[34 of 48 positions shown; findings below may reference images not displayed]

FINDINGS: Multiple sequences are moderately to severely motion degraded.

Brain: A heterogeneously enhancing right temporal lobe mass has not
significantly changed in size from the prior MRI, measuring 4.9 x
2.6 x 2.1 cm (AP x transverse x craniocaudal) (previously 4.9 x
x 2.0 cm). There is persistent mild surrounding T2
hyperintensity/edema which is grossly similar to the prior MRI.
Associated chronic blood products are noted.

A hemorrhagic 2.7 x 2.5 cm right frontal mass has slightly increased
in size although the amount of enhancement has decreased, and there
is no significant edema. No new intracranial metastases are
identified, however small lesions could easily be obscured by
motion. No acute infarct, midline shift, or extra-axial fluid
collection is identified. A mildly expanded partially empty sella is
again noted. Mild cerebral atrophy is unchanged. Background mild
chronic small vessel ischemia in the cerebral white matter is
similar to the prior MRI.

Vascular: Major intracranial vascular flow voids are preserved.

Skull and upper cervical spine: Chronically diminished bone marrow
T1 signal intensity diffusely which may be related to the patient's
known anemia. No grossly destructive skull lesion.

Sinuses/Orbits: Unremarkable orbits. Clear paranasal sinuses.
Increased, large left and moderate right mastoid effusions.

Other: None.
IMPRESSION: 1. Severely motion degraded examination.
2. Slightly increased size of hemorrhagic right frontal metastasis
with decreased enhancement. No significant edema.
3. Unchanged right temporal metastasis.
4. No new intracranial metastases or acute infarct identified.
5. Increased size of left larger than right mastoid effusions.

## 2020-08-03 NOTE — Telephone Encounter (Signed)
NP called and spoke with [place] who reported patient continues to decline in her functional status and that family is concerned about her health; considering hospice service or long-term care placement.  She was agreeable to NP coming out for a visit 08/05/2020 at 1:30 PM.

## 2020-08-05 ENCOUNTER — Other Ambulatory Visit: Payer: Medicare Other | Admitting: Hospice

## 2020-08-05 ENCOUNTER — Other Ambulatory Visit: Payer: Self-pay

## 2020-08-05 DIAGNOSIS — Z170 Estrogen receptor positive status [ER+]: Secondary | ICD-10-CM

## 2020-08-05 DIAGNOSIS — C50211 Malignant neoplasm of upper-inner quadrant of right female breast: Secondary | ICD-10-CM

## 2020-08-05 DIAGNOSIS — Z515 Encounter for palliative care: Secondary | ICD-10-CM

## 2020-08-05 NOTE — Progress Notes (Signed)
[facility] Consult Note Telephone: [phone]  Fax: [phone]  PATIENT NAME: [name] DOB: 04-03-1956 MRN: [id number]  PRIMARY CARE PROVIDER:   Libby Maw, MD Libby Maw, MD [place],  [place] [address]  REFERRING PROVIDER:[name], Sarajane Jews, MD RESPONSIBLE PARTY:Self Contacts: [phone] [address] [name] [phone]   RECOMMENDATIONS/PLAN:  Advance Care Planning/Goals of Care Visitto build trust andfollow up on palliative care.  NP called and spoke with [place] 08/03/2020 who reported patient continues to decline in her functional status and that family is concerned about her health; considering hospice service or long-term care placement.  In person visit for 08/05/2020 requested for exploration of goals of care. [name] was not home during visit.  Later spoke with her on the phone and she said that patient has refused hospice services at this time saying that she wants to continue to fight for her life.  [name] reported family members are deliberating long-term care placement to ensure patient's safety because of her increasing confusion.  [name] said patient becomes a danger to herself when she is so desperate to get pain medications.  CODE STATUS: Patient is a DO NOT RESUSCITATE.   Goals of care: Goals of care include to maximize quality of life and symptom management.    Follow DQ:[id number] care will continue to follow patient for goals of care clarification and symptom management.  Follow-up in 2 months  Symptom management:Patient is alert, denied pain/discomfort, no coughing, no shortness of breath.  All she wanted to talk about was how to get to a doctor that gives  her pain medications.  She requested NP several times to drive her to the drugstore or to a clinic to get pain medications.  NP declined pointing out that she had reported no pain at this  time.   [name] reported that PCP and Oncologist had discontinued patient's pain medications but somehow she still gets some pain medications.  She expressed concern that when patient takes those pain medications she becomes somnolent, more confused and a danger to herself.   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  No report of adverse reactions.  [name] manages patient's medications on a weekly basis in a pill organizer.  She said she will revert to giving patient her medications on a daily basis because patient scrambles them when the weekly supply is given to her.  Validation provided.     Patient with ongoing confusion and memory loss likely related to brain mets.She continues on wearing her briefs for incontinence.  No edema to bilateral lower extremities.  Patient in no medical acuity,  Encouraged ongoing care and to call with concerns. Community/caregiver/family supports: Patient lives at home with her spouse and daughter [name].  She is of the christian faith; she says her Dar[employer] Meigs faith gives her hope and  spiritual anchor I spent59minutes providing this consultation;time includes time with patient/family, chart review and documentation. More than 50% of the time in this consultation was spent on coordinating communication  HISTORY OF PRESENT ILLNESS:[name] Foxis a 64 y.o.year oldfemalewith multiple medical problems includingStage IV estrogen receptor negative Breast Cancer, with brain mets, angiosarcoma, Gout.Palliative Care was asked to help address goals of care.    CODE STATUS:DNR  JHE:RDEY 50% HOSPICE ELIGIBILITY/DIAGNOSIS: TBD  PAST MEDICAL HISTORY:  Past Medical History:  Diagnosis Date  . Breast cancer ([facility])   .  DVT (deep venous thrombosis) ([facility]) 10/07/2011  . Fever 02/06/2018  . Hypertension   . Radiation 11/29/2005-12/29/2005   right  supraclavicular area 4147 cGy  . Radiation 06/26/02-08/14/02   right breast 5040 cGy, tumor bed boosted to 1260 cGy  . Rheumatic fever     SOCIAL HX:  Social History   Tobacco Use  . Smoking status: Former Research scientist (life sciences)  . Smokeless tobacco: Never Used  Substance Use Topics  . Alcohol use: Never    Comment: Rarely    ALLERGIES:  Allergies  Allergen Reactions  . Tramadol Nausea Only  . Hydrocodone-Acetaminophen Itching and Rash  . Pork-Derived Products Other (See Comments)    Pt says she just doesn't eat pork; has no reaction to pork products     PERTINENT MEDICATIONS:  Outpatient Encounter Medications as of 08/05/2020  Medication Sig  . acetaminophen (TYLENOL) 325 MG tablet Take 2 tablets (650 mg total) by mouth every 6 (six) hours as needed for mild pain (or Fever >/= 101). (Patient not taking: Reported on 07/21/2020)  . allopurinol (ZYLOPRIM) 300 MG tablet Take 300 mg by mouth daily. (Patient not taking: Reported on 06/12/2020)  . dexamethasone (DECADRON) 4 MG tablet Take 1 tablet (4 mg total) by mouth daily.  . iron polysaccharides (NIFEREX) 150 MG capsule Take 1 capsule (150 mg total) by mouth daily. (Patient not taking: Reported on 06/12/2020)  . levETIRAcetam (KEPPRA) 750 MG tablet Take 1 tablet (750 mg total) by mouth 2 (two) times daily.  . Multiple Vitamin (MULTIVITAMIN) capsule Take 1 capsule by mouth daily.    . mupirocin ointment (BACTROBAN) 2 % Place 1 application into the nose 2 (two) times daily. (Patient not taking: Reported on 06/12/2020)  . omeprazole (PRILOSEC) 40 MG capsule Take 1 capsule (40 mg total) by mouth daily. (Patient not taking: Reported on 06/12/2020)  . ondansetron (ZOFRAN) 4 MG tablet Take 1 tablet (4 mg total) by mouth 3 (three) times daily as needed.  . Oxycodone HCl 20 MG TABS Take 20 mg by mouth 3 (three) times daily as needed.   . Potassium Chloride ER 20 MEQ TBCR Take 1 tablet by mouth daily.  . prochlorperazine (COMPAZINE) 10 MG tablet Take 1 tablet (10 mg  total) by mouth every 6 (six) hours as needed for nausea or vomiting. (Patient not taking: Reported on 07/21/2020)  . sertraline (ZOLOFT) 100 MG tablet Take 1 tablet (100 mg total) by mouth daily. (Patient not taking: Reported on 06/12/2020)  . triamterene-hydrochlorothiazide (DYAZIDE) 37.5-25 MG capsule Take 1 each (1 capsule total) by mouth daily.  . TUKYSA 150 MG tablet TAKE 2 TABLETS (300 MG TOTAL) BY MOUTH EVERY MORNING. TAKE AS DIRECTED BY MD. (Patient not taking: Reported on 07/21/2020)   Facility-Administered Encounter Medications as of 08/05/2020  Medication  . sodium chloride 0.9 % injection 10 mL  . sodium chloride flush (NS) 0.9 % injection 10 mL    PHYSICAL EXAM/ROS:  General: Not in acute distress Cardiovascular: regular rate and rhythm; denies chest pain Pulmonary: clear ant//post fields; normal respiratory effort Abdomen: soft, nontender, + bowel sounds; incontinent of bladder occasional incontinence of bowel GU: no suprapubic tenderness Extremities: No edema to bilateral lower extremities Neurological: Weakness but otherwise nonfocal; some confusion noted  Teodoro Spray, NP

## 2020-08-16 ENCOUNTER — Other Ambulatory Visit: Payer: Self-pay

## 2020-08-16 ENCOUNTER — Emergency Department (HOSPITAL_COMMUNITY): Payer: Medicare Other

## 2020-08-16 ENCOUNTER — Encounter (HOSPITAL_COMMUNITY): Payer: Self-pay | Admitting: Emergency Medicine

## 2020-08-16 ENCOUNTER — Emergency Department (HOSPITAL_COMMUNITY)
Admission: EM | Admit: 2020-08-16 | Discharge: 2020-08-17 | Disposition: A | Discharge status: Home / Self Care | Payer: Medicare Other | Attending: Emergency Medicine | Admitting: Emergency Medicine

## 2020-08-16 DIAGNOSIS — R2243 Localized swelling, mass and lump, lower limb, bilateral: Secondary | ICD-10-CM | POA: Diagnosis present

## 2020-08-16 DIAGNOSIS — R609 Edema, unspecified: Secondary | ICD-10-CM | POA: Insufficient documentation

## 2020-08-16 DIAGNOSIS — I10 Essential (primary) hypertension: Secondary | ICD-10-CM | POA: Insufficient documentation

## 2020-08-16 DIAGNOSIS — Z87891 Personal history of nicotine dependence: Secondary | ICD-10-CM | POA: Insufficient documentation

## 2020-08-16 DIAGNOSIS — L03119 Cellulitis of unspecified part of limb: Secondary | ICD-10-CM

## 2020-08-16 DIAGNOSIS — Z853 Personal history of malignant neoplasm of breast: Secondary | ICD-10-CM | POA: Diagnosis not present

## 2020-08-16 LAB — CBC WITH DIFFERENTIAL/PLATELET
Abs Immature Granulocytes: 0.21 10*3/uL — ABNORMAL HIGH (ref 0.00–0.07)
Basophils Absolute: 0 10*3/uL (ref 0.0–0.1)
Basophils Relative: 0 %
Eosinophils Absolute: 0.1 10*3/uL (ref 0.0–0.5)
Eosinophils Relative: 0 %
HCT: 34.1 % — ABNORMAL LOW (ref 36.0–46.0)
Hemoglobin: 10.7 g/dL — ABNORMAL LOW (ref 12.0–15.0)
Immature Granulocytes: 1 %
Lymphocytes Relative: 12 %
Lymphs Abs: 2.2 10*3/uL (ref 0.7–4.0)
MCH: 27.3 pg (ref 26.0–34.0)
MCHC: 31.4 g/dL (ref 30.0–36.0)
MCV: 87 fL (ref 80.0–100.0)
Monocytes Absolute: 1.5 10*3/uL — ABNORMAL HIGH (ref 0.1–1.0)
Monocytes Relative: 8 %
Neutro Abs: 14.4 10*3/uL — ABNORMAL HIGH (ref 1.7–7.7)
Neutrophils Relative %: 79 %
Platelets: 182 10*3/uL (ref 150–400)
RBC: 3.92 MIL/uL (ref 3.87–5.11)
RDW: 20.9 % — ABNORMAL HIGH (ref 11.5–15.5)
WBC: 18.4 10*3/uL — ABNORMAL HIGH (ref 4.0–10.5)
nRBC: 0 % (ref 0.0–0.2)

## 2020-08-16 LAB — BASIC METABOLIC PANEL
Anion gap: 13 (ref 5–15)
BUN: 21 mg/dL (ref 8–23)
CO2: 32 mmol/L (ref 22–32)
Calcium: 9.4 mg/dL (ref 8.9–10.3)
Chloride: 96 mmol/L — ABNORMAL LOW (ref 98–111)
Creatinine, Ser: 0.99 mg/dL (ref 0.44–1.00)
GFR, Estimated: 60 mL/min — ABNORMAL LOW (ref >60)
Glucose, Bld: 96 mg/dL (ref 70–99)
Potassium: 3.7 mmol/L (ref 3.5–5.1)
Sodium: 141 mmol/L (ref 135–145)

## 2020-08-16 LAB — D-DIMER, QUANTITATIVE: D-Dimer, Quant: 1.49 ug/mL-FEU — ABNORMAL HIGH (ref 0.00–0.50)

## 2020-08-16 MED ORDER — LORAZEPAM 0.5 MG PO TABS
0.5000 mg | ORAL_TABLET | Freq: Every evening | ORAL | Status: DC | PRN
  Administered 2020-08-17: 0.5 mg via ORAL
  Filled 2020-08-16: qty 1

## 2020-08-16 MED ORDER — SODIUM CHLORIDE 0.9 % IV SOLN: 1.0000 g | Freq: Once | INTRAVENOUS | Status: DC

## 2020-08-16 MED ORDER — SODIUM CHLORIDE 0.9 % IV SOLN
1.0000 g | Freq: Once | INTRAVENOUS | Status: AC
  Administered 2020-08-17: 1 g via INTRAVENOUS
  Filled 2020-08-16: qty 10

## 2020-08-16 MED ORDER — FUROSEMIDE 10 MG/ML IJ SOLN
20.0000 mg | Freq: Once | INTRAMUSCULAR | Status: AC
  Administered 2020-08-17: 20 mg via INTRAVENOUS
  Filled 2020-08-16: qty 4

## 2020-08-16 NOTE — ED Provider Notes (Signed)
[facility] DEPT Provider Note   CSN: [id number] Arrival date & time: 08/16/20  1940     History Chief Complaint  Patient presents with  . Foot Pain    [name] is a 64 y.o. female.  HPI   Patient presents to the ED for evaluation of foot swelling.  Patient states she has had bilateral foot swelling for the last couple of days.  Patient states that has made it hard for her to wear shoes.  Swellings actually a little bit better now than it was earlier.  She is not having any difficulty breathing.  No chest pain.  Patient denies any prior history of blood clots.  No fevers or chills.  Patient does have a history of metastatic breast cancer.  Patient was recently in the hospital on September 14.  Patient was noted to have a hemorrhagic brain metastases.  Past Medical History:  Diagnosis Date  . Breast cancer ([place])   . DVT (deep venous thrombosis) ([place]) 10/07/2011  . Fever 02/06/2018  . Hypertension   . Radiation 11/29/2005-12/29/2005   right supraclavicular area 4147 cGy  . Radiation 06/26/02-08/14/02   right breast 5040 cGy, tumor bed boosted to 1260 cGy  . Rheumatic fever     Patient Active Problem List   Diagnosis Date Noted  . Seizure disorder, focal motor ([place]) 07/22/2020  . Hypomagnesemia 07/22/2020  . Cerebral edema ([place]) 07/22/2020  . Depression with anxiety 06/26/2020  . Substance abuse ([place]) 06/12/2020  . Urinary frequency 06/12/2020  . Other insomnia 06/12/2020  . Cancer of right breast metastatic to brain ([place]) 03/16/2020  . Nausea & vomiting 02/29/2020  . UTI (urinary tract infection) 02/11/2020  . Acute metabolic encephalopathy 04/30/7627  . Generalized weakness 02/11/2020  . Hypokalemia 02/11/2020  . Paresis of left lower extremity ([place]) 02/11/2020  . Paresis of right lower extremity ([place]) 02/11/2020  . Drug-induced polyneuropathy ([place]) 12/19/2019  . Acute respiratory failure with hypoxia ([place]) 11/19/2019  . COVID-19 virus  infection 11/19/2019  . Overdose opiate, accidental or unintentional, initial encounter ([place]) 11/19/2019  . Acute encephalopathy 11/19/2019  . Metastasis to brain ([place]) 09/13/2019  . Acute lower UTI 02/06/2018  . Altered mental status 02/06/2018  . Bacteremia 02/06/2018  . Polypharmacy 02/06/2018  . Goals of care, counseling/discussion 09/05/2017  . Pneumonia 01/25/2017  . CAP (community acquired pneumonia) 01/23/2017  . Port catheter in place 05/30/2016  . 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 Chronic pain 04/13/2015  . Left arm pain 08/18/2014  . Malignant neoplasm of upper-inner quadrant of right breast in female, estrogen receptor positive ([place]) 10/03/2013  . Post-lymphadenectomy lymphedema of arm 08/15/2013  . Port or reservoir infection 01/29/2013  . DVT (deep venous thrombosis) ([place]) 10/07/2011  . Chest pain 09/10/2009  . Secondary cardiomyopathy ([place]) 09/02/2009  . Essential hypertension 02/26/2009  . GERD 02/26/2009    Past Surgical History:  Procedure Laterality Date  . BREAST SURGERY    . MASTECTOMY Bilateral   . PORT A CATH REVISION       OB History   No obstetric history on file.     Family History  Problem Relation Age of Onset  . Pancreatic cancer Mother   . Kidney cancer Sister 33  . Breast cancer Sister 65  . Breast cancer Other 61    Social History   Tobacco Use  . Smoking status: Former Research scientist (life sciences)  . Smokeless tobacco: Never Used  Vaping Use  . Vaping Use: Never  used  Substance Use Topics  . Alcohol use: Never    Comment: Rarely  . Drug use: No    Home Medications Prior to Admission medications   Medication Sig Start Date End Date Taking? Authorizing Provider  dexamethasone (DECADRON) 4 MG tablet Take 1 tablet (4 mg total) by mouth daily. 07/23/20  Yes Oretha Milch D, MD  methadone (DOLOPHINE) 10 MG tablet Take 10 mg by mouth in the morning, at noon, and at bedtime.  08/08/20  Yes [provider]    acetaminophen (TYLENOL) 325 MG tablet Take 2 tablets (650 mg total) by mouth every 6 (six) hours as needed for mild pain (or Fever >/= 101). Patient not taking: Reported on 07/21/2020 03/24/20   Georgette Shell, MD  allopurinol (ZYLOPRIM) 300 MG tablet Take 300 mg by mouth daily. Patient not taking: Reported on 06/12/2020 01/20/20   [provider]  iron polysaccharides (NIFEREX) 150 MG capsule Take 1 capsule (150 mg total) by mouth daily. Patient not taking: Reported on 06/12/2020 03/02/20   Raiford Noble Latif, DO  levETIRAcetam (KEPPRA) 750 MG tablet Take 1 tablet (750 mg total) by mouth 2 (two) times daily. 07/23/20   Desiree Hane, MD  Multiple Vitamin (MULTIVITAMIN) capsule Take 1 capsule by mouth daily.      [provider]  mupirocin ointment (BACTROBAN) 2 % Place 1 application into the nose 2 (two) times daily. Patient not taking: Reported on 06/12/2020 03/24/20   Georgette Shell, MD  [facility] 4 MG/0.1ML LIQD nasal spray kit 1 spray as directed. 07/28/20   [provider]  omeprazole (PRILOSEC) 40 MG capsule Take 1 capsule (40 mg total) by mouth daily. Patient not taking: Reported on 06/12/2020 02/04/20   [name], [name], MD  ondansetron (ZOFRAN) 4 MG tablet Take 1 tablet (4 mg total) by mouth 3 (three) times daily as needed. 07/24/20   Causey, Charlestine Massed, NP  oxyCODONE (ROXICODONE) 15 MG immediate release tablet Take 15 mg by mouth 5 (five) times daily as needed. 07/28/20   [provider]  Oxycodone HCl 20 MG TABS Take 20 mg by mouth 3 (three) times daily as needed.  05/02/20   [provider]  Potassium Chloride ER 20 MEQ TBCR Take 1 tablet by mouth daily. 07/23/20   Oretha Milch D, MD  potassium chloride SA (KLOR-CON) 20 MEQ tablet Take 20 mEq by mouth daily. 07/23/20   [provider]  prochlorperazine (COMPAZINE) 10 MG tablet Take 1 tablet (10 mg total) by mouth every 6 (six) hours as needed for nausea or vomiting. Patient not  taking: Reported on 07/21/2020 07/01/20   [name], [name], MD  sertraline (ZOLOFT) 100 MG tablet Take 1 tablet (100 mg total) by mouth daily. Patient not taking: Reported on 06/12/2020 05/28/20   Harle Stanford., PA-C  triamterene-hydrochlorothiazide (DYAZIDE) 37.5-25 MG capsule Take 1 each (1 capsule total) by mouth daily. 07/23/20   Desiree Hane, MD  triamterene-hydrochlorothiazide (MAXZIDE-25) 37.5-25 MG tablet Take 1 tablet by mouth daily. 07/23/20   [provider]  TUKYSA 150 MG tablet TAKE 2 TABLETS (300 MG TOTAL) BY MOUTH EVERY MORNING. TAKE AS DIRECTED BY MD. Patient not taking: Reported on 07/21/2020 05/26/20   [name], [name], MD    Allergies    Tramadol, Hydrocodone-acetaminophen, and Pork-derived products  Review of Systems   Review of Systems  All other systems reviewed and are negative.   Physical Exam Updated Vital Signs BP 136/89   Pulse 72  Temp 98.8 F (37.1 C)   Resp 18   Ht 1.626 m ('5\' 4"' )   Wt 78.8 kg   SpO2 98%   BMI 29.82 kg/m   Physical Exam Vitals and nursing note reviewed.  Constitutional:      Appearance: She is well-developed. She is not toxic-appearing or diaphoretic.  HENT:     Head: Normocephalic and atraumatic.     Right Ear: External ear normal.     Left Ear: External ear normal.  Eyes:     General: No scleral icterus.       Right eye: No discharge.        Left eye: No discharge.     Conjunctiva/sclera: Conjunctivae normal.  Neck:     Trachea: No tracheal deviation.  Cardiovascular:     Rate and Rhythm: Normal rate and regular rhythm.  Pulmonary:     Effort: Pulmonary effort is normal. No respiratory distress.     Breath sounds: Normal breath sounds. No stridor. No wheezing or rales.  Abdominal:     General: Bowel sounds are normal. There is no distension.     Palpations: Abdomen is soft.     Tenderness: There is no abdominal tenderness. There is no guarding or rebound.  Musculoskeletal:        General: Swelling  present. No tenderness.     Cervical back: Neck supple.     Right lower leg: Edema present.     Left lower leg: Edema present.     Comments: Pitting edema the bilateral lower extremities, asymmetric right greater than left  Skin:    General: Skin is warm and dry.     Findings: No rash.  Neurological:     Cranial Nerves: No cranial nerve deficit (no facial droop, extraocular movements intact, no slurred speech).     Sensory: No sensory deficit.     Motor: No abnormal muscle tone or seizure activity.     Coordination: Coordination normal.     ED Results / Procedures / Treatments   Labs (all labs ordered are listed, but only abnormal results are displayed) Labs Reviewed  CBC WITH DIFFERENTIAL/PLATELET - Abnormal; Notable for the following components:      Result Value   WBC 18.4 (*)    Hemoglobin 10.7 (*)    HCT 34.1 (*)    RDW 20.9 (*)    Neutro Abs 14.4 (*)    Monocytes Absolute 1.5 (*)    Abs Immature Granulocytes 0.21 (*)    All other components within normal limits  BASIC METABOLIC PANEL - Abnormal; Notable for the following components:   Chloride 96 (*)    GFR, Estimated 60 (*)    All other components within normal limits  D-DIMER, QUANTITATIVE (NOT AT [facility]) - Abnormal; Notable for the following components:   D-Dimer, Quant 1.49 (*)    All other components within normal limits    EKG None  Radiology DG Chest Portable 1 View  Result Date: 08/16/2020 CLINICAL DATA:  Foot pain and swelling, breast cancer, DVT EXAM: PORTABLE CHEST 1 VIEW COMPARISON:  None. FINDINGS: Lungs are clear. No pneumothorax or pleural effusion. Cardiac size within normal limits. Pulmonary vascularity is normal. Left internal jugular chest port is seen with its tip within the right atrium. Surgical clips are seen within the right axilla. No acute bone abnormality. IMPRESSION: No active disease. Electronically Signed   By: Fidela Salisbury MD   On: 08/16/2020 21:22    Procedures Procedures  (including critical care  time)  Medications Ordered in ED Medications  LORazepam (ATIVAN) tablet 0.5 mg (has no administration in time range)  furosemide (LASIX) injection 20 mg (has no administration in time range)    ED Course  I have reviewed the triage vital signs and the nursing notes.  Pertinent labs & imaging results that were available during my care of the patient were reviewed by me and considered in my medical decision making (see chart for details).  Clinical Course as of Aug 17 2303  Nancy Fetter Aug 16, 2020  2051 30 day oxycodone on 9/21 and 30 day methadone on 10/2   [JK]  2220 D dimer elevated.  WBC elevated.   BMET normal   [JK]    Clinical Course User Index [JK] Dorie Rank, MD   MDM Rules/Calculators/A&P                         Patient's labs reviewed.  She does have leukocytosis increased compared to previous values.  Metabolic panel is normal.  D-dimer is elevated.  Presentation is concerning for the possibility of DVT.  She does have bilateral lower extremity swelling but she does have asymmetric right greater than left.  Patient is at risk with her history of malignancy.  Cellulitis is a possibility, she does have a low-grade temp and leukocytosis but exam is not suggestive of cellulitis.  Patient is not a candidate for anticoagulation at this time as she had a recent intracerebral hemorrhage.  We will have the patient hold in the ED overnight.  I will give her a dose of diuretics and antibiotics.  Plan on vascular ultrasound in the morning.  Care turned over to Dr Christy Gentles Final Clinical Impression(s) / ED Diagnoses Final diagnoses:  Peripheral edema    Rx / DC Orders ED Discharge Orders    None       Dorie Rank, MD 08/16/20 2305

## 2020-08-16 NOTE — ED Provider Notes (Signed)
Plan at signout: Bilateral DVT study If negative, treat with antibiotics for cellulitis and lasix    Ripley Fraise, MD 08/16/20 2314

## 2020-08-16 NOTE — ED Triage Notes (Signed)
Pt reports bilateral foot pain and swelling x2 days. Last cancer treatment was earlier this month. Patient had COVID on 07/24/20 and denies any remaining symptoms.

## 2020-08-17 ENCOUNTER — Emergency Department (HOSPITAL_BASED_OUTPATIENT_CLINIC_OR_DEPARTMENT_OTHER): Payer: Medicare Other

## 2020-08-17 DIAGNOSIS — M7989 Other specified soft tissue disorders: Secondary | ICD-10-CM

## 2020-08-17 DIAGNOSIS — R609 Edema, unspecified: Secondary | ICD-10-CM | POA: Diagnosis not present

## 2020-08-17 MED ORDER — FUROSEMIDE 20 MG PO TABS: 20.0000 mg | ORAL_TABLET | Freq: Every day | ORAL | 0 refills | Status: DC

## 2020-08-17 MED ORDER — CEPHALEXIN 500 MG PO CAPS: 500.0000 mg | ORAL_CAPSULE | Freq: Two times a day (BID) | ORAL | 0 refills | Status: DC

## 2020-08-17 MED ORDER — OXYCODONE HCL 5 MG PO TABS
15.0000 mg | ORAL_TABLET | Freq: Four times a day (QID) | ORAL | Status: DC | PRN
  Administered 2020-08-17: 15 mg via ORAL
  Filled 2020-08-17: qty 3

## 2020-08-17 MED ORDER — LEVETIRACETAM 500 MG PO TABS
750.0000 mg | ORAL_TABLET | Freq: Two times a day (BID) | ORAL | Status: DC
  Administered 2020-08-17 (×2): 750 mg via ORAL
  Filled 2020-08-17 (×2): qty 1

## 2020-08-17 NOTE — ED Provider Notes (Signed)
Assumed care from Dr. Christy Gentles at 7 AM.  Patient waiting on DVT study.  DVT study was negative for clot.  However patient does have a new leukocytosis of 18,000 and low-grade fever on initial evaluation.  Suspect possible cellulitis and patient was discharged with antibiotics.  Findings were discussed with the patient and she had no further questions.   Blanchie Dessert, MD 08/17/20 [phone]

## 2020-08-17 NOTE — Progress Notes (Signed)
Bilateral lower extremity venous duplex has been completed. Preliminary results can be found in CV Proc through chart review.  Results were given to Dr. Maryan Rued.  08/17/20 8:58 AM Carlos Levering RVT

## 2020-08-17 NOTE — ED Provider Notes (Signed)
Signed out to Dr. Maryan Rued at shift change to f/u on DVT study   Ripley Fraise, MD 08/17/20 [phone]

## 2020-08-17 NOTE — ED Notes (Signed)
Patient's family called to pick up patient for discharge.

## 2020-08-17 NOTE — ED Provider Notes (Signed)
Patient resting comfortably, no acute distress.  Other than leg pain and swelling, she has no other complaints except for chronic back pain.  We will reorder her home pain medications as well as Keppra. Patient denies any chest pain or shortness of breath. She is awaiting DVT ultrasound   Ripley Fraise, MD 08/17/20 0002

## 2020-08-17 NOTE — Discharge Instructions (Addendum)
The ultrasound today showed no sign of blood clots in your leg.  However there is concern for infection because your white blood cell count is elevated today and you did have a low-grade fever.  You were given a dose of antibiotics in the emergency room and you can continue oral antibiotics over the next 1 week.  If you start having worsening of your symptoms such as very high fever, vomiting, inability to hold down oral antibiotics or your leg becomes severely painful very swollen or draining please return to the emergency room.

## 2020-08-17 NOTE — ED Notes (Signed)
Pt out of bed attempting to walk around the room. Pt unstable and leaning on railings. Pt redirected back to bed after a complete linen bed change.

## 2020-08-18 ENCOUNTER — Telehealth: Payer: Self-pay

## 2020-08-18 NOTE — Telephone Encounter (Signed)
Pt called and LVM X2 asking when her next appt/ chemo is, and also questions why [name] took her off of Gabapentin, as she has "been in the hospital for lymphedema and gabapentin really helps." Based on D/C summary, it appears Gabapentin was D/C'd by Dr Oretha Milch, but note did not explain why, and pt was sent home with abx and steroids d/t cellulitis. Pt was negative for DVT in ED. This LPN attempted to call pt back to discuss with her, however she did not answer and her VM box is full. I left a message with pt's husband who is a truck driver travelling, for pt to return our call so we can be of further assistance. Husband states he will send text message for he to call us back.

## 2020-08-19 ENCOUNTER — Other Ambulatory Visit: Payer: Self-pay

## 2020-08-19 DIAGNOSIS — C50211 Malignant neoplasm of upper-inner quadrant of right female breast: Secondary | ICD-10-CM

## 2020-08-19 DIAGNOSIS — Z170 Estrogen receptor positive status [ER+]: Secondary | ICD-10-CM

## 2020-08-19 NOTE — Progress Notes (Signed)
[name]  [phone] [phone]    ID: [name]   DOB: 01-17-1956  MR#: [id number]  ENI#:[id number]  Patient Care Team: [name] Maw, MD as PCP - General (Family Medicine) [name], [name] Dad, MD as Consulting Physician (Oncology) [name] Pita, MD as Consulting Physician (Radiation Oncology) [name], [name] Pascal, MD as Consulting Physician (Cardiology) [name], [name] Lav, MD as Consulting Physician (Psychiatry) [name] Hale, MD as Consulting Physician (Anesthesiology) [name] Garter, MD as Consulting Physician (Internal Medicine) [name]. [name], [name] 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   She receives Abraxane/trastuzumab/Pertuzumab monthly.  Her most recent treatment was 07/24/2020.  Her most recent echocardiogram on 12/25/2019 showed an ejection fraction of 60-65%.  Since her last visit, she presented to the ED with lymphedema on 08/16/2020. She underwent lower extremity doppler at that time, which was negative. She was discharged with antibiotics for possible cellulitis.  She was also started on Lasix.  She says that the swelling is now completely gone.   REVIEW OF SYSTEMS: [name] denies fever.  She does have a cough productive of some clear phlegm.  She has not received the Covid vaccine and tells me her children do not want her to get vaccinated.  She tells me her daughter fills her medication bottle but unfortunately her but daughter is not with her today so she is not quite clear on what she is supposed to be taking.  She denies unusual headaches, visual changes, nausea, vomiting, balance issues,  or falls.  A detailed review of systems was otherwise stable   BREAST CANCER HISTORY: From the original intake note:  The patient originally presented in May 2003 when she noticed a lump in the upper inner quadrant of her right breast in September 2003.  She sought attention and had a mammogram which showed an obvious carcinoma in the upper inner quadrant of the right breast, approximately 2 cm.  There were some enlarged lymph nodes in the axilla and an FNA done showed those consistent with malignant cells, most likely an invasive ductal carcinoma.  At that point she was unsure of what to do and was referred by [name]. Marylene [name] for a discussion of her treatment options.  By biopsy, it was ER/PR negative and HER-2 was 3+.  DNA index was 1.42.  We reiterated that it was most important to have her disease surgically addressed at that point and had recommended lumpectomy and axillary nodes to be addressed with which she followed through and on 04-03-02, [name] performed a right partial mastectomy and a right axillary lymph node dissection.  Final pathology revealed a 2.4 cm. high grade, Grade III invasive ductal carcinoma with an adjacent .8 cm. also high grade invasive ductal carcinoma which was felt to represent an intramammary metastasis rather than a second primary.  A smaller mass was just medial to the larger mass.  The smaller mass was associated with high grade DCIS component.  There was no definite lymphovascular invasion identified.  However, one of fourteen axillary lymph nodes did contain a 1.5 cm. metastatic deposit.  Postoperatively she did very well.  We reiterated the fact that she did need adjuvant chemotherapy, however, she refused and decided that she would pursue  radiation.  She received radiation and completed that on 08-14-02. Her subsequent history is as detailed below.   PAST MEDICAL HISTORY: Past Medical History:  Diagnosis Date   Breast cancer ([name])    DVT (deep venous  thrombosis) ([name]) 10/07/2011   Fever 02/06/2018   Hypertension    Radiation 11/29/2005-12/29/2005   right supraclavicular area 4147 cGy   Radiation 06/26/02-08/14/02   right breast 5040 cGy, tumor bed boosted to 1260 cGy   Rheumatic fever     PAST SURGICAL HISTORY: Past Surgical History:  Procedure Laterality Date   BREAST SURGERY     MASTECTOMY Bilateral    PORT A CATH REVISION      FAMILY HISTORY Family History  Problem Relation Age of Onset   Pancreatic cancer Mother    Kidney cancer Sister 22   Breast cancer Sister 11   Breast cancer Other 56  The patient's father died in his 50D  from complications of alcohol abuse. The patient's mother died in her 4s from pancreatic cancer. The patient has 6 brothers, 2 sisters. Both her sisters have had breast cancer, both diagnosed after the age of 28. The patient has not had genetic testing so far.   GYNECOLOGIC HISTORY: Menarche age 29; first live birth age 37. She is GXP4. She is not sure when she stopped having periods. She never used hormone replacement.   SOCIAL HISTORY: The patient has worked in the past as a Psychologist, counselling. At home in addition to the patient are her husband [name], originally from [place], who works as a Administrator.  The other children are [name], who lives with the patient and has a college degree in psychology; she works as a Probation officer; no longer living in the home is her daughter [name] (she is studying to be a Marine scientist) and grandson.son [name], lives in [place] and works as a Higher education careers adviser. Son [name] also sometimes stays in the home. In addition the patient has an aide who helps her almost daily.    ADVANCED DIRECTIVES: The patient has completed advanced directives and name her daughter [name] as her healthcare power of attorney.  The patient also completed a living will.  This is notarized.  These documents are being sent for scanning on epic on 01/15/2020   HEALTH  MAINTENANCE: Social History   Tobacco Use   Smoking status: Former Smoker   Smokeless tobacco: Never Used  Scientific laboratory technician Use: Never used  Substance Use Topics   Alcohol use: Never    Comment: Rarely   Drug use: No     Colonoscopy:  Not on file  PAP:  Not on file  Bone density:  Not on file  Lipid panel:  Not on file   Allergies  Allergen Reactions   Tramadol Nausea Only   Hydrocodone-Acetaminophen Itching and Rash   Pork-Derived Products Other (See Comments)    Pt says she just doesn't eat pork; has no reaction to pork products    Current Outpatient Medications  Medication Sig Dispense Refill   acetaminophen (TYLENOL) 325 MG tablet Take 2 tablets (650 mg total) by mouth every 6 (six) hours as needed for mild pain (or Fever >/= 101). (Patient not taking: Reported on 07/21/2020)     allopurinol (ZYLOPRIM) 300 MG tablet Take 300 mg by mouth daily. (Patient not taking: Reported on 06/12/2020)     ALPRAZolam (XANAX) 1 MG tablet Take 1 mg by mouth daily as needed for anxiety.     cephALEXin (  KEFLEX) 500 MG capsule Take 1 capsule (500 mg total) by mouth 2 (two) times daily. 2 caps po bid x 7 days 14 capsule 0   dexamethasone (DECADRON) 4 MG tablet Take 1 tablet (4 mg total) by mouth daily. 45 tablet 0   furosemide (LASIX) 20 MG tablet Take 1 tablet (20 mg total) by mouth daily. 7 tablet 0   iron polysaccharides (NIFEREX) 150 MG capsule Take 1 capsule (150 mg total) by mouth daily. (Patient not taking: Reported on 06/12/2020) 30 capsule 0   levETIRAcetam (KEPPRA) 750 MG tablet Take 1 tablet (750 mg total) by mouth 2 (two) times daily. 60 tablet 1   methadone (DOLOPHINE) 10 MG tablet Take 10 mg by mouth in the morning, at noon, and at bedtime.      Multiple Vitamin (MULTIVITAMIN) capsule Take 1 capsule by mouth daily.       mupirocin ointment (BACTROBAN) 2 % Place 1 application into the nose 2 (two) times daily. (Patient not taking: Reported on 06/12/2020) 22 g 0    NARCAN 4 MG/0.1ML LIQD nasal spray kit 1 spray as directed.     omeprazole (PRILOSEC) 40 MG capsule Take 1 capsule (40 mg total) by mouth daily. (Patient not taking: Reported on 06/12/2020) 30 capsule 1   ondansetron (ZOFRAN) 4 MG tablet Take 1 tablet (4 mg total) by mouth 3 (three) times daily as needed. 20 tablet 3   Potassium Chloride ER 20 MEQ TBCR Take 1 tablet by mouth daily. 60 tablet 2   prochlorperazine (COMPAZINE) 10 MG tablet Take 1 tablet (10 mg total) by mouth every 6 (six) hours as needed for nausea or vomiting. (Patient not taking: Reported on 07/21/2020) 30 tablet 1   traZODone (DESYREL) 50 MG tablet Take 25-50 mg by mouth at bedtime.     tucatinib (TUKYSA) 150 MG tablet TAKE 2 TABLETS (300 MG TOTAL) BY MOUTH EVERY MORNING. TAKE AS DIRECTED BY MD. 60 tablet 3   No current facility-administered medications for this visit.   Facility-Administered Medications Ordered in Other Visits  Medication Dose Route Frequency Provider Last Rate Last Admin   sodium chloride 0.9 % injection 10 mL  10 mL Intravenous PRN Lalah Durango, [name] Dad, MD   10 mL at 03/04/14 1421   sodium chloride flush (NS) 0.9 % injection 10 mL  10 mL Intracatheter PRN Ely Ballen, [name] Dad, MD   10 mL at 07/30/19 1627    OBJECTIVE: African-American woman in no acute distress  Vitals:   08/20/20 1040  BP: (!) 141/80  Pulse: 86  Resp: 17  Temp: 98.3 F (36.8 C)  SpO2: 96%   Wt Readings from Last 3 Encounters:  08/20/20 162 lb 3.2 oz (73.6 kg)  08/16/20 173 lb 11.2 oz (78.8 kg)  07/24/20 175 lb 4.8 oz (79.5 kg)   Body mass index is 27.84 kg/m.    Sclerae unicteric, EOMs intact Wearing a mask Lungs no rales or rhonchi Heart regular rate and rhythm Abd soft, nontender, positive bowel sounds MSK no focal spinal tenderness, chronic right upper extremity lymphedema without erythema, mild Neuro: nonfocal, well oriented, appropriate affect Breasts: status post right mastectomy.   Skin: The lesions along the  upper right arm and along the back are stable  LAB RESULTS:.  Lab Results  Component Value Date   WBC 16.5 (H) 08/20/2020   NEUTROABS 14.1 (H) 08/20/2020   HGB 11.2 (L) 08/20/2020   HCT 35.2 (L) 08/20/2020   MCV 85.9 08/20/2020   PLT 259 08/20/2020  Chemistry      Component Value Date/Time   NA 141 08/16/2020 2129   NA 142 11/09/2017 1214   K 3.7 08/16/2020 2129   K 3.9 11/09/2017 1214   CL 96 (L) 08/16/2020 2129   CL 101 03/07/2013 1411   CO2 32 08/16/2020 2129   CO2 32 (H) 11/09/2017 1214   BUN 21 08/16/2020 2129   BUN 19.1 11/09/2017 1214   CREATININE 0.99 08/16/2020 2129   CREATININE 0.92 07/24/2020 0947   CREATININE 1.0 11/09/2017 1214      Component Value Date/Time   CALCIUM 9.4 08/16/2020 2129   CALCIUM 9.1 11/09/2017 1214   ALKPHOS 72 07/24/2020 0947   ALKPHOS 120 11/09/2017 1214   AST 39 07/24/2020 0947   AST 30 11/09/2017 1214   ALT 33 07/24/2020 0947   ALT 19 11/09/2017 1214   BILITOT 0.3 07/24/2020 0947   BILITOT <0.22 11/09/2017 1214       STUDIES: CT Head Wo Contrast  Result Date: 07/21/2020 CLINICAL DATA:  Seizure EXAM: CT HEAD WITHOUT CONTRAST TECHNIQUE: Contiguous axial images were obtained from the base of the skull through the vertex without intravenous contrast. COMPARISON:  CT brain and MRI brain 03/20/2020, CT 02/10/2020 FINDINGS: Brain: Hematocrit level in the right frontal lobe consistent with hemorrhagic metastatic lesion. Surrounding edema within the right frontal lobe. Considerably increased edema within the right temporal lobe at the site of known metastatic lesion. Suspected small hyperdense foci within the temporal lobe edema raises concern for small foci of hemorrhage. No midline shift. Stable ventricle size. Vascular: No hyperdense vessels.  No unexpected calcification Skull: Normal. Negative for fracture or focal lesion. Sinuses/Orbits: Fluid in the left mastoid air cells. Sinuses are clear Other: None. IMPRESSION: 1. Hematocrit  level in the right frontal lobe consistent with hemorrhagic metastatic lesion with surrounding edema in the right frontal lobe, this finding was present on prior CT from May. Considerable increase in edema within the right temporal lobe at the site of known metastatic lesion. Suspected small foci of hemorrhage within the right temporal lobe edema. No midline shift. 2. Fluid in the left mastoid air cells. Critical Value/emergent results were called by telephone at the time of interpretation on 07/21/2020 at 9:19 pm to provider [facility] , who verbally acknowledged these results. Electronically Signed   By: Donavan Foil [name]   On: 07/21/2020 21:19   DG Chest Portable 1 View  Result Date: 08/16/2020 CLINICAL DATA:  Foot pain and swelling, breast cancer, DVT EXAM: PORTABLE CHEST 1 VIEW COMPARISON:  None. FINDINGS: Lungs are clear. No pneumothorax or pleural effusion. Cardiac size within normal limits. Pulmonary vascularity is normal. Left internal jugular chest port is seen with its tip within the right atrium. Surgical clips are seen within the right axilla. No acute bone abnormality. IMPRESSION: No active disease. Electronically Signed   By: Fidela Salisbury MD   On: 08/16/2020 21:22   VAS [name] LOWER EXTREMITY VENOUS (DVT) (ONLY [facility] & [facility])  Result Date: 08/17/2020  Lower Venous DVT Study Indications: Swelling.  Risk Factors: Cancer. Limitations: Body habitus and poor ultrasound/tissue interface. Comparison Study: No prior studies. Performing Technologist: Oliver Hum RVT  Examination Guidelines: A complete evaluation includes B-mode imaging, spectral Doppler, color Doppler, and power Doppler as needed of all accessible portions of each vessel. Bilateral testing is considered an integral part of a complete examination. Limited examinations for reoccurring indications may be performed as noted. The reflux portion of the exam is performed with the patient in  reverse Trendelenburg.   +---------+---------------+---------+-----------+----------+--------------+  RIGHT     Compressibility Phasicity Spontaneity Properties Thrombus Aging  +---------+---------------+---------+-----------+----------+--------------+  CFV       Full            Yes       Yes                                    +---------+---------------+---------+-----------+----------+--------------+  SFJ       Full                                                             +---------+---------------+---------+-----------+----------+--------------+  FV Prox   Full                                                             +---------+---------------+---------+-----------+----------+--------------+  FV Mid    Full                                                             +---------+---------------+---------+-----------+----------+--------------+  FV Distal                 Yes       Yes                                    +---------+---------------+---------+-----------+----------+--------------+  PFV       Full                                                             +---------+---------------+---------+-----------+----------+--------------+  POP       Full            Yes       Yes                                    +---------+---------------+---------+-----------+----------+--------------+  PTV       Full                                                             +---------+---------------+---------+-----------+----------+--------------+  PERO      Full                                                             +---------+---------------+---------+-----------+----------+--------------+   +---------+---------------+---------+-----------+----------+--------------+  LEFT      Compressibility Phasicity Spontaneity Properties Thrombus Aging  +---------+---------------+---------+-----------+----------+--------------+  CFV       Full            Yes       Yes                                     +---------+---------------+---------+-----------+----------+--------------+  SFJ       Full                                                             +---------+---------------+---------+-----------+----------+--------------+  FV Prox   Full                                                             +---------+---------------+---------+-----------+----------+--------------+  FV Mid    Full                                                             +---------+---------------+---------+-----------+----------+--------------+  FV Distal Full                                                             +---------+---------------+---------+-----------+----------+--------------+  PFV       Full                                                             +---------+---------------+---------+-----------+----------+--------------+  POP       Full            Yes       Yes                                    +---------+---------------+---------+-----------+----------+--------------+  PTV       Full                                                             +---------+---------------+---------+-----------+----------+--------------+  PERO      Full                                                             +---------+---------------+---------+-----------+----------+--------------+  Summary: RIGHT: - There is no evidence of deep vein thrombosis in the lower extremity. However, portions of this examination were limited- see technologist comments above.  - No cystic structure found in the popliteal fossa.  LEFT: - There is no evidence of deep vein thrombosis in the lower extremity. However, portions of this examination were limited- see technologist comments above.  - No cystic structure found in the popliteal fossa.  *See table(s) above for measurements and observations. Electronically signed by Monica Martinez MD on 08/17/2020 at 5:21:00 PM.    Final      ASSESSMENT:  64 y.o. [place] woman with stage IV breast  cancer   (1) Status post right upper inner quadrant lumpectomy and sentinel lymph node sampling 03/04/2002 for 2 separate foci of invasive ductal carcinoma, mpT2 pN1 or stage IIB, both foci grade 3, both estrogen and progesterone receptor positive, both HercepTest 3+, Mib-1 56%  (2) Reexcision for margins 05/27/2002 showed no residual cancer in the breast.  (3) The patient refused adjuvant systemic therapy.  (4) Adjuvant radiation treatment completed 08/14/2002.  (5) recurrence in the right breast in 02/2004 showing a morphologically different tumor, again grade 3, again estrogen and progesterone receptor negative, with an MIB-1 of 14% and Herceptest 3+.  (5) Between 03/2004 and 07/2004 she received dose dense Doxorubicin/Cyclophosphamide x 4 given with trastuzumab, followed by weekly Docetaxel x 8, again given with trastuzumab.  (6) Right mastectomy 07/13/2004 showed scattered microscopic foci of residual disease over an area  greater than 5 cm. Margins were negative.  (7) Postoperative Docetaxel continued until 09/2004.  (8) Trastuzumab (Herceptin) given 08/2004 through 01/2012 with some brief interruptions.  RECURRENT/ STAGE IV DISEASE December 2006 (9) Isolated right cervical nodal recurrence 10/2005, treated with radiation to the right supraclavicular area (total 41.5 gray) completed 12/29/2005.  (10) Navelbine given together with Herceptin  11/2005 through 03/2006.  (9) Left mastectomy 02/13/2006 for ductal carcinoma in situ, grade 2, estrogen and progesterone receptor negative, with negative margins; 0 of 3 lymph nodes involved  (10) treated with Lapatinib and Capecitabine before 10/2009, for an unclear duration and with unclear results (cannot locate data on chart review).  (11) Status post right supraclavicular lymph node biopsy 09/2010 again positive for an invasive ductal carcinoma, estrogen and progesterone receptor negative, HER-2 positive by CISH with a ratio 4.25.  (12)  Navelbine given together with Herceptin between 05/2011 and 11/2011.  (13) Carboplatin/ Gemcitabine/ Herceptin given for 2 cycles, in 12/2011 and 01/2012.  (14) TDM-1 ([place]) started 02/2012. Last dose 10/02/2013 after which the patient discontinued treatment at her own discretion. Echo on December 2014 showed a well preserved ejection fraction.  (15) Deep vein thrombosis of the right upper extremity documented 04/20/2011.  She completed anticoagulant therapy with Coumadin on 03/25/2013.  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 Left chest Port-A-Cath placed on 04/08/2013; being flushed every 6 weeks  RIGHT UPPER EXTREMITY ANGIOSARCOMA VS BREAST CANCER: August 2016 (18) treated at [facility] [place] August 2016 with paclitaxel, trastuzumab and pertuzumab 1 cycle, afterwards referred to hospice  (19) under the care of hospice of [facility] August 2016 to 05/08/2016, when the patient opted for a second try at chemotherapy  (20) started low-dose Abraxane, trastuzumab and pertuzumab 06/07/2016, to be repeated every 4 weeks.   (a)  pretreatment echocardiogram 06/06/2016 showed a 60-65% ejection fraction  (b) significant compliance problems compromise response to treatment  (c)  echocardiogram 02/20/17 LVEF 55-60%  (d) Abraxane discontinued 07/04/2017 because of possible neuropathy symptoms  (e) Abraxane resumed 09/27/2017  (f) echocardiogram 07/20/2017 showed an ejection fraction of 60-65%  (g) echo 01/22/2018 showed an ejection fraction of 50-55%  (h) CT scan of the chest 05/03/2018 shows no disease involving the lungs liver or lymph nodes, with stable subcutaneous masses as previously noted  (I) Abraxane discontinued August 2019 because of  concerns regarding neuropathy-- gemcitabine substituted  (j) echocardiogram 08/20/2018 shows an ejection fraction in the 55-60% range (done every 6 months)  (k) gemcitabine discontinued because of multiple symptoms, Abraxane restarted 09/11/2018, given once every 4 weeks  (l) echocardiogram 07/17/2019 shows a well-preserved ejection fraction at 55-60%  (m) restaging PET scan 08/20/2019 shows hypermetabolic adenopathy, subcutaneous malignancy as clinically appreciated, but no liver or lung parenchymal lesions   (21) Chronic pain secondary to known metastatic disease  (a) patient signed pain contract 10/19/2016  (b) as of 11/16/2016 receives her pain medications from [name] [name]  (c)  [name] [name] no longer able to prescribe as of February 2019  (d) all pain medications now through [facility], [name] Mirna Mires (as of April 2019)  (22) left breast and left axillary adenopathy noted clinically  (a) left axillary lymph node biopsy on 07/24/2019 benign  (23) BRAIN METASTASES: frontal and temporal lobe metastases noted on brain MRI 09/04/2019  (a) adjuvant radiation 09/11/2019-10/16/2019:    The brain was treated to 30 Gy in 10 fractions.   (b) tucatinib prescribed December 2020 at 150 mg daily because of methadone interaction, finally started March 2021 but taken irregularly by the patient.  (c) noncontrast head CT 01/12/2020 stable lesions/improved edema  (d) brain MRI 02/01/2020 shows decreased size of the right frontal and temporal lobe metastases with mild residual edema, no mass-effect, and no evidence of new mets  (e) noncontrast MRI of the cervical, thoracic, and lumbar spine 02/11/2020 and 02/12/2020 showed no metastatic disease  (24) COVID-19 infection 2019-11-19   PLAN: Azora is just about 16 years out from definitive diagnosis of metastatic breast cancer.  On the peripheral disease she continues on Abraxane and anti-HER-2 treatment and this is well controlled.  She tolerates  the treatment remarkably well and so far has not developed any significant peripheral neuropathy.  The CNS disease has been treated as detailed above.  She just had a noncontrast head CT on 07/21/2020 which showed edema.  Neuro oncology had written for her to have a brain MRI in July but that has not been done.  I have gone ahead and extended that order so that can be done in the next couple of weeks.  Koa's pain is now controlled through her primary care physician and we do not write for Xanax or any narcotic for her.  She has not had an echo since February.  She understands it is important for her to document a good cardiac function every 6 months or so.  I have rewritten the order and she should have that in the next couple of weeks as well  I went over her medications with her and wrote down that it is important for her to take her Keflex, Decadron, Keppra, and tucatinib and I went ahead and refilled all that just in case that were short.  I also let her know that it is fine for her daughter to come in with  her to review medicines next time  Total encounter time 35 minutes.Sarajane Jews C. Tawana Pasch, MD 08/20/20 11:05 AM Medical Oncology and Hematology [facility] [place], [place] [address] Tel. [phone]    Fax. [phone]   I, Wilburn Mylar, am acting as scribe for [name]. Virgie [name]. Shanese Riemenschneider.  I, Lurline Del MD, have reviewed the above documentation for accuracy and completeness, and I agree with the above.    *Total Encounter Time as defined by the Centers for Medicare and Medicaid Services includes, in addition to the face-to-face time of a patient visit (documented in the note above) non-face-to-face time: obtaining and reviewing outside history, ordering and reviewing medications, tests or procedures, care coordination (communications with other health care professionals or caregivers) and documentation in the medical record.

## 2020-08-20 ENCOUNTER — Inpatient Hospital Stay: Payer: Medicare Other

## 2020-08-20 ENCOUNTER — Other Ambulatory Visit: Payer: Self-pay

## 2020-08-20 ENCOUNTER — Inpatient Hospital Stay: Payer: Medicare Other | Attending: Oncology

## 2020-08-20 ENCOUNTER — Telehealth: Payer: Self-pay

## 2020-08-20 ENCOUNTER — Inpatient Hospital Stay (HOSPITAL_BASED_OUTPATIENT_CLINIC_OR_DEPARTMENT_OTHER): Payer: Medicare Other | Admitting: Oncology

## 2020-08-20 VITALS — BP 143/87 | HR 84 | Temp 98.3°F | Resp 19

## 2020-08-20 VITALS — BP 141/80 | HR 86 | Temp 98.3°F | Resp 17 | Ht 64.0 in | Wt 162.2 lb

## 2020-08-20 DIAGNOSIS — C4911 Malignant neoplasm of connective and soft tissue of right upper limb, including shoulder: Secondary | ICD-10-CM

## 2020-08-20 DIAGNOSIS — Z7952 Long term (current) use of systemic steroids: Secondary | ICD-10-CM | POA: Diagnosis not present

## 2020-08-20 DIAGNOSIS — C770 Secondary and unspecified malignant neoplasm of lymph nodes of head, face and neck: Secondary | ICD-10-CM | POA: Insufficient documentation

## 2020-08-20 DIAGNOSIS — Z170 Estrogen receptor positive status [ER+]: Secondary | ICD-10-CM

## 2020-08-20 DIAGNOSIS — Z86718 Personal history of other venous thrombosis and embolism: Secondary | ICD-10-CM | POA: Diagnosis not present

## 2020-08-20 DIAGNOSIS — C50211 Malignant neoplasm of upper-inner quadrant of right female breast: Secondary | ICD-10-CM | POA: Insufficient documentation

## 2020-08-20 DIAGNOSIS — G8929 Other chronic pain: Secondary | ICD-10-CM | POA: Diagnosis not present

## 2020-08-20 DIAGNOSIS — Z800 Family history of malignant neoplasm of digestive organs: Secondary | ICD-10-CM | POA: Insufficient documentation

## 2020-08-20 DIAGNOSIS — Z95828 Presence of other vascular implants and grafts: Secondary | ICD-10-CM

## 2020-08-20 DIAGNOSIS — Z5111 Encounter for antineoplastic chemotherapy: Secondary | ICD-10-CM | POA: Insufficient documentation

## 2020-08-20 DIAGNOSIS — Z9013 Acquired absence of bilateral breasts and nipples: Secondary | ICD-10-CM | POA: Diagnosis not present

## 2020-08-20 DIAGNOSIS — C7931 Secondary malignant neoplasm of brain: Secondary | ICD-10-CM | POA: Insufficient documentation

## 2020-08-20 DIAGNOSIS — Z803 Family history of malignant neoplasm of breast: Secondary | ICD-10-CM | POA: Insufficient documentation

## 2020-08-20 DIAGNOSIS — Z5112 Encounter for antineoplastic immunotherapy: Secondary | ICD-10-CM | POA: Insufficient documentation

## 2020-08-20 DIAGNOSIS — Z923 Personal history of irradiation: Secondary | ICD-10-CM | POA: Diagnosis not present

## 2020-08-20 DIAGNOSIS — I890 Lymphedema, not elsewhere classified: Secondary | ICD-10-CM | POA: Diagnosis not present

## 2020-08-20 DIAGNOSIS — Z8616 Personal history of COVID-19: Secondary | ICD-10-CM | POA: Diagnosis not present

## 2020-08-20 DIAGNOSIS — C50911 Malignant neoplasm of unspecified site of right female breast: Secondary | ICD-10-CM | POA: Diagnosis not present

## 2020-08-20 DIAGNOSIS — Z87891 Personal history of nicotine dependence: Secondary | ICD-10-CM | POA: Diagnosis not present

## 2020-08-20 DIAGNOSIS — Z8051 Family history of malignant neoplasm of kidney: Secondary | ICD-10-CM | POA: Insufficient documentation

## 2020-08-20 DIAGNOSIS — Z9221 Personal history of antineoplastic chemotherapy: Secondary | ICD-10-CM | POA: Insufficient documentation

## 2020-08-20 LAB — CBC WITH DIFFERENTIAL (CANCER CENTER ONLY)
Abs Immature Granulocytes: 0.21 10*3/uL — ABNORMAL HIGH (ref 0.00–0.07)
Basophils Absolute: 0 10*3/uL (ref 0.0–0.1)
Basophils Relative: 0 %
Eosinophils Absolute: 0 10*3/uL (ref 0.0–0.5)
Eosinophils Relative: 0 %
HCT: 35.2 % — ABNORMAL LOW (ref 36.0–46.0)
Hemoglobin: 11.2 g/dL — ABNORMAL LOW (ref 12.0–15.0)
Immature Granulocytes: 1 %
Lymphocytes Relative: 6 %
Lymphs Abs: 0.9 10*3/uL (ref 0.7–4.0)
MCH: 27.3 pg (ref 26.0–34.0)
MCHC: 31.8 g/dL (ref 30.0–36.0)
MCV: 85.9 fL (ref 80.0–100.0)
Monocytes Absolute: 1.1 10*3/uL — ABNORMAL HIGH (ref 0.1–1.0)
Monocytes Relative: 7 %
Neutro Abs: 14.1 10*3/uL — ABNORMAL HIGH (ref 1.7–7.7)
Neutrophils Relative %: 86 %
Platelet Count: 259 10*3/uL (ref 150–400)
RBC: 4.1 MIL/uL (ref 3.87–5.11)
RDW: 20.7 % — ABNORMAL HIGH (ref 11.5–15.5)
WBC Count: 16.5 10*3/uL — ABNORMAL HIGH (ref 4.0–10.5)
nRBC: 0 % (ref 0.0–0.2)

## 2020-08-20 LAB — CMP (CANCER CENTER ONLY)
ALT: 36 U/L (ref 0–44)
AST: 29 U/L (ref 15–41)
Albumin: 3.5 g/dL (ref 3.5–5.0)
Alkaline Phosphatase: 86 U/L (ref 38–126)
Anion gap: 7 (ref 5–15)
BUN: 15 mg/dL (ref 8–23)
CO2: 33 mmol/L — ABNORMAL HIGH (ref 22–32)
Calcium: 10 mg/dL (ref 8.9–10.3)
Chloride: 97 mmol/L — ABNORMAL LOW (ref 98–111)
Creatinine: 1.08 mg/dL — ABNORMAL HIGH (ref 0.44–1.00)
GFR, Estimated: 54 mL/min — ABNORMAL LOW (ref >60)
Glucose, Bld: 114 mg/dL — ABNORMAL HIGH (ref 70–99)
Potassium: 4.1 mmol/L (ref 3.5–5.1)
Sodium: 137 mmol/L (ref 135–145)
Total Bilirubin: 0.4 mg/dL (ref 0.3–1.2)
Total Protein: 7.4 g/dL (ref 6.5–8.1)

## 2020-08-20 MED ORDER — HEPARIN SOD (PORK) LOCK FLUSH 100 UNIT/ML IV SOLN
500.0000 [IU] | Freq: Once | INTRAVENOUS | Status: AC | PRN
  Administered 2020-08-20: 500 [IU]
  Filled 2020-08-20: qty 5

## 2020-08-20 MED ORDER — DEXAMETHASONE 4 MG PO TABS: 4.0000 mg | ORAL_TABLET | Freq: Every day | ORAL | 0 refills | Status: DC

## 2020-08-20 MED ORDER — DIPHENHYDRAMINE HCL 25 MG PO CAPS
ORAL_CAPSULE | ORAL | Status: AC
  Filled 2020-08-20: qty 1

## 2020-08-20 MED ORDER — CEPHALEXIN 500 MG PO CAPS
500.0000 mg | ORAL_CAPSULE | Freq: Two times a day (BID) | ORAL | 0 refills | Status: DC
Start: 2020-08-20 — End: 2020-09-18

## 2020-08-20 MED ORDER — ACETAMINOPHEN 325 MG PO TABS
650.0000 mg | ORAL_TABLET | Freq: Once | ORAL | Status: AC
  Administered 2020-08-20: 650 mg via ORAL

## 2020-08-20 MED ORDER — SODIUM CHLORIDE 0.9% FLUSH
10.0000 mL | INTRAVENOUS | Status: DC | PRN
  Administered 2020-08-20: 10 mL
  Filled 2020-08-20: qty 10

## 2020-08-20 MED ORDER — TRASTUZUMAB-ANNS CHEMO 150 MG IV SOLR
450.0000 mg | Freq: Once | INTRAVENOUS | Status: AC
  Administered 2020-08-20: 450 mg via INTRAVENOUS
  Filled 2020-08-20: qty 21.43

## 2020-08-20 MED ORDER — PROCHLORPERAZINE MALEATE 10 MG PO TABS
ORAL_TABLET | ORAL | Status: AC
  Filled 2020-08-20: qty 1

## 2020-08-20 MED ORDER — TUCATINIB 150 MG PO TABS: ORAL_TABLET | ORAL | 3 refills | Status: DC

## 2020-08-20 MED ORDER — SODIUM CHLORIDE 0.9 % IV SOLN
420.0000 mg | Freq: Once | INTRAVENOUS | Status: AC
  Administered 2020-08-20: 420 mg via INTRAVENOUS
  Filled 2020-08-20: qty 14

## 2020-08-20 MED ORDER — PROCHLORPERAZINE MALEATE 10 MG PO TABS
10.0000 mg | ORAL_TABLET | Freq: Once | ORAL | Status: AC
  Administered 2020-08-20: 10 mg via ORAL

## 2020-08-20 MED ORDER — SODIUM CHLORIDE 0.9% FLUSH
10.0000 mL | Freq: Once | INTRAVENOUS | Status: AC
  Administered 2020-08-20: 10 mL
  Filled 2020-08-20: qty 10

## 2020-08-20 MED ORDER — LEVETIRACETAM 750 MG PO TABS
750.0000 mg | ORAL_TABLET | Freq: Two times a day (BID) | ORAL | 1 refills | Status: DC
Start: 2020-08-20 — End: 2020-09-11

## 2020-08-20 MED ORDER — DIPHENHYDRAMINE HCL 25 MG PO CAPS
25.0000 mg | ORAL_CAPSULE | Freq: Once | ORAL | Status: AC
  Administered 2020-08-20: 25 mg via ORAL

## 2020-08-20 MED ORDER — FUROSEMIDE 20 MG PO TABS
20.0000 mg | ORAL_TABLET | Freq: Every day | ORAL | 0 refills | Status: DC
Start: 2020-08-20 — End: 2020-10-07

## 2020-08-20 MED ORDER — PACLITAXEL PROTEIN-BOUND CHEMO INJECTION 100 MG
100.0000 mg/m2 | Freq: Once | INTRAVENOUS | Status: AC
  Administered 2020-08-20: 200 mg via INTRAVENOUS
  Filled 2020-08-20: qty 40

## 2020-08-20 MED ORDER — SODIUM CHLORIDE 0.9 % IV SOLN
Freq: Once | INTRAVENOUS | Status: AC
  Filled 2020-08-20: qty 250

## 2020-08-20 MED ORDER — ACETAMINOPHEN 325 MG PO TABS
ORAL_TABLET | ORAL | Status: AC
  Filled 2020-08-20: qty 2

## 2020-08-20 NOTE — Progress Notes (Signed)
[name] states he is aware pt has not had echo since February 2021 and this is OK.

## 2020-08-20 NOTE — Patient Instructions (Signed)

## 2020-08-20 NOTE — Patient Instructions (Signed)
[facility] Discharge Instructions for Patients Receiving Chemotherapy  Today you received the following chemotherapy agents: trastuzumab, pertuzumab, and abraxane.  To help prevent nausea and vomiting after your treatment, we encourage you to take your nausea medication as directed.   If you develop nausea and vomiting that is not controlled by your nausea medication, call the clinic.   BELOW ARE SYMPTOMS THAT SHOULD BE REPORTED IMMEDIATELY:  *FEVER GREATER THAN 100.5 F  *CHILLS WITH OR WITHOUT FEVER  NAUSEA AND VOMITING THAT IS NOT CONTROLLED WITH YOUR NAUSEA MEDICATION  *UNUSUAL SHORTNESS OF BREATH  *UNUSUAL BRUISING OR BLEEDING  TENDERNESS IN MOUTH AND THROAT WITH OR WITHOUT PRESENCE OF ULCERS  *URINARY PROBLEMS  *BOWEL PROBLEMS  UNUSUAL RASH Items with * indicate a potential emergency and should be followed up as soon as possible.  Feel free to call the clinic should you have any questions or concerns. The clinic phone number is [phone].  Please show the [facility] at check-in to the Emergency Department and triage nurse.

## 2020-08-20 NOTE — Telephone Encounter (Signed)
Volunteer check in call made for palliative care: Doing ok, little confused

## 2020-08-20 NOTE — Progress Notes (Signed)
Per [name], [name] to treat today despite labs. Notified Chelsea, RN

## 2020-08-26 ENCOUNTER — Other Ambulatory Visit: Payer: Self-pay | Admitting: Oncology

## 2020-09-02 ENCOUNTER — Other Ambulatory Visit: Payer: Self-pay | Admitting: Pharmacist

## 2020-09-02 DIAGNOSIS — C50211 Malignant neoplasm of upper-inner quadrant of right female breast: Secondary | ICD-10-CM

## 2020-09-02 DIAGNOSIS — Z170 Estrogen receptor positive status [ER+]: Secondary | ICD-10-CM

## 2020-09-02 MED ORDER — TUCATINIB 50 MG PO TABS: 150.0000 mg | ORAL_TABLET | Freq: Every morning | ORAL | 3 refills | Status: DC

## 2020-09-02 MED ORDER — TUCATINIB 150 MG PO TABS: ORAL_TABLET | ORAL | 3 refills | Status: DC

## 2020-09-02 MED FILL — TUKYSA 50 MG TABS: 50 | 20 days supply | Qty: 60 | Fill #0

## 2020-09-02 NOTE — Addendum Note (Signed)
Addended byBritt Boozer on: 09/02/2020 02:44 PM   Modules accepted: Orders

## 2020-09-02 NOTE — Progress Notes (Addendum)
Oral Oncology Pharmacist Encounter  Confirmed with Dr. Jana Hakim that patient is to be on tucatinib 150 mg by mouth once daily due to drug-drug interaction with patient's methadone.   Tucatinib is required to be dispensed in 60 count bottle, and patient's insurance will not cover current tucatinib prescription for dose of 150 mg for 60 day supply.  Prescription updated to tucatinib 50 mg with the following directions" "Take 3 tablets (150 mg total) by mouth in the morning. Take as directed by MD" Updated prescription sent to [facility]   Leron Croak, PharmD, BCPS Hematology/Oncology Clinical Pharmacist [facility] [phone] 09/02/2020 1:54 PM

## 2020-09-03 ENCOUNTER — Other Ambulatory Visit: Payer: Self-pay | Admitting: Oncology

## 2020-09-03 DIAGNOSIS — C50211 Malignant neoplasm of upper-inner quadrant of right female breast: Secondary | ICD-10-CM

## 2020-09-03 DIAGNOSIS — Z170 Estrogen receptor positive status [ER+]: Secondary | ICD-10-CM

## 2020-09-03 MED ORDER — TUCATINIB 50 MG PO TABS: 150.0000 mg | ORAL_TABLET | Freq: Every morning | ORAL | 3 refills | Status: DC

## 2020-09-10 ENCOUNTER — Other Ambulatory Visit (HOSPITAL_COMMUNITY): Payer: Medicare Other

## 2020-09-10 ENCOUNTER — Encounter (HOSPITAL_COMMUNITY): Payer: Self-pay | Admitting: Cardiology

## 2020-09-10 NOTE — Progress Notes (Unsigned)
Patient ID: [name], female   DOB: 05/01/1956, 64 y.o.   MRN: [id number]  Verified appointment "no show" status with Elmo Putt at 7:47VE.

## 2020-09-11 ENCOUNTER — Other Ambulatory Visit: Payer: Self-pay | Admitting: Oncology

## 2020-09-14 ENCOUNTER — Ambulatory Visit: Payer: Medicare Other | Admitting: Family Medicine

## 2020-09-14 ENCOUNTER — Telehealth: Payer: Self-pay

## 2020-09-14 NOTE — Telephone Encounter (Signed)
RN returned patient's daughter, [name], call.  Left voicemail to return call.

## 2020-09-15 ENCOUNTER — Telehealth: Payer: Self-pay

## 2020-09-15 ENCOUNTER — Other Ambulatory Visit: Payer: Self-pay

## 2020-09-15 ENCOUNTER — Other Ambulatory Visit: Payer: Medicare Other | Admitting: Hospice

## 2020-09-15 DIAGNOSIS — C50211 Malignant neoplasm of upper-inner quadrant of right female breast: Secondary | ICD-10-CM

## 2020-09-15 DIAGNOSIS — Z170 Estrogen receptor positive status [ER+]: Secondary | ICD-10-CM

## 2020-09-15 DIAGNOSIS — Z515 Encounter for palliative care: Secondary | ICD-10-CM

## 2020-09-15 NOTE — Telephone Encounter (Signed)
Received message to call patient's daughter. Phone call placed to daughter who stated that she needs to speak with Windy Canny NP. NP made aware

## 2020-09-15 NOTE — Telephone Encounter (Signed)
Pt's daughty symone called voicing concerns for her mother, asking if she thinks it would be appropriate for her mother to go into drug rehab. This LPN asked daughter what sx pt is having. Daughter states when mom runs out of medication she hallucinates and "acts delirious". Pt was supposed to have brain MRI in July that was never obtained. [name] extended this order. Will get this scheduled. Pt is scheduled to see Wilber Bihari, NP 09/18/20. This LPN suggested daughter come along with pt for appt. Daughter verbalizes thanks and understanding.

## 2020-09-15 NOTE — Progress Notes (Signed)
[facility] Consult Note Telephone: [phone]  Fax: [phone]  PATIENT NAME: [name] DOB: Jan 25, 1956 MRN: [id number]  PRIMARY CARE PROVIDER:   Libby Maw, MD Libby Maw, MD [place],  [place] [address]  REFERRING PROVIDER:[name], Sarajane Jews, MD  RESPONSIBLE PARTY:Self Contacts: [id number] [phone] [address] [name] [phone]   TELEHEALTH VISIT STATEMENT Due to the COVID-19 crisis, this visit was done via telephone from my office. It was initiated and consented to by this patient and/or family.   RECOMMENDATIONS/PLAN:   Telehealth visit at the request of [name] that patient "is overmedicating herself and getting high". When asked where patient was getting the narcotics from, she said patient was "getting drugs from people she knows from the neighborhood'. She further explained that patient insists "let me do what I want to do. I am going to die anyway so I want to be high". During telehealth visit, patient was confused at baseline, difficult to understand and said she was sleepy; no respiratory distress; she denied pain/discomfort.  Patient was attending Narcotics Anonymous years before she was diagnosed with breast cancer and was in rehab for drug use in 2015 in [place]. Therapeutic listening and emotional support provided to [name] as she shared her [name] and wondering if she could send patient to a rehab facility. NP advised  That sending patient to a rehab facility has to be cleared with Oncologist since patient is still on chemotherapy. She said she will call Oncologist. Extensive discussion on goals of care. To further discuss in two when [name] will be available.  NP referred this matter to [facility] clinical navigator and social worker for resources/follow up. Palliative care will continue to provide support to patient, family and the medical team.    Patient has Narcan at home. Daleen Snook knows to call with worsening condition/[name].  Advance Care Planning/Code Status: Patient is a DO NOT RESUSCITATE  Goals of Care: Goals of care include to maximize quality of life and symptom management.  Follow up: Palliative care will continue to follow patient for goals of care clarification and symptom management. F/u in 2 weeks/prn for goals of care clarification.   Family /Caregiver/Community Supports:  I spent 46 minutes providing this consultation; time includes time spent with patient/family, chart review, provider coordination,  and documentation. More than 50% of the time in this consultation was spent on coordinating communication  HISTORY OF PRESENT ILLNESS:[name] Foxis a 64 y.o.year oldfemalewith multiple medical problems includingStage IV estrogen receptor negative Breast Cancer, with brain mets, angiosarcoma, Gout.Palliative Care was asked to help address goals of care.    CODE STATUS: DNR  HOSPICE ELIGIBILITY/DIAGNOSIS: TBD  PAST MEDICAL HISTORY:  Past Medical History:  Diagnosis Date  . Breast cancer ([place])   . DVT (deep venous thrombosis) ([place]) 10/07/2011  . Fever 02/06/2018  . Hypertension   . Radiation 11/29/2005-12/29/2005   right supraclavicular area 4147 cGy  . Radiation 06/26/02-08/14/02   right breast 5040 cGy, tumor bed boosted to 1260 cGy  . Rheumatic fever     SOCIAL HX:  Social History   Tobacco Use  . Smoking status: Former Research scientist (life sciences)  . Smokeless tobacco: Never Used  Substance Use Topics  . Alcohol use: Never    Comment: Rarely    ALLERGIES:  Allergies  Allergen Reactions  . Tramadol Nausea Only  . Hydrocodone-Acetaminophen Itching and Rash  . Pork-Derived Products Other (See Comments)    Pt says she just  doesn't eat pork; has no reaction to pork products     PERTINENT MEDICATIONS:  Outpatient Encounter Medications as of 09/15/2020  Medication Sig  . acetaminophen (TYLENOL) 325 MG tablet Take 2  tablets (650 mg total) by mouth every 6 (six) hours as needed for mild pain (or Fever >/= 101). (Patient not taking: Reported on 07/21/2020)  . allopurinol (ZYLOPRIM) 300 MG tablet Take 300 mg by mouth daily. (Patient not taking: Reported on 06/12/2020)  . ALPRAZolam (XANAX) 1 MG tablet Take 1 mg by mouth daily as needed for anxiety.  . cephALEXin (KEFLEX) 500 MG capsule Take 1 capsule (500 mg total) by mouth 2 (two) times daily. 2 caps po bid x 7 days  . dexamethasone (DECADRON) 4 MG tablet Take 1 tablet (4 mg total) by mouth daily.  . furosemide (LASIX) 20 MG tablet Take 1 tablet (20 mg total) by mouth daily.  . iron polysaccharides (NIFEREX) 150 MG capsule Take 1 capsule (150 mg total) by mouth daily. (Patient not taking: Reported on 06/12/2020)  . levETIRAcetam (KEPPRA) 750 MG tablet TAKE 1 TABLET BY MOUTH 2 TIMES DAILY.  . methadone (DOLOPHINE) 10 MG tablet Take 10 mg by mouth in the morning, at noon, and at bedtime.   . Multiple Vitamin (MULTIVITAMIN) capsule Take 1 capsule by mouth daily.    . mupirocin ointment (BACTROBAN) 2 % Place 1 application into the nose 2 (two) times daily. (Patient not taking: Reported on 06/12/2020)  . NARCAN 4 MG/0.1ML LIQD nasal spray kit 1 spray as directed.  Marland Kitchen omeprazole (PRILOSEC) 40 MG capsule Take 1 capsule (40 mg total) by mouth daily. (Patient not taking: Reported on 06/12/2020)  . ondansetron (ZOFRAN) 4 MG tablet Take 1 tablet (4 mg total) by mouth 3 (three) times daily as needed.  . Potassium Chloride ER 20 MEQ TBCR Take 1 tablet by mouth daily.  . prochlorperazine (COMPAZINE) 10 MG tablet Take 1 tablet (10 mg total) by mouth every 6 (six) hours as needed for nausea or vomiting.  . traZODone (DESYREL) 50 MG tablet Take 25-50 mg by mouth at bedtime.  . tucatinib (TUKYSA) 50 MG tablet Take 3 tablets (150 mg total) by mouth in the morning. Take as directed by MD.   Facility-Administered Encounter Medications as of 09/15/2020  Medication  . sodium chloride 0.9 %  injection 10 mL  . sodium chloride flush (NS) 0.9 % injection 10 mL    Teodoro Spray, NP

## 2020-09-15 NOTE — Telephone Encounter (Signed)
Received message that patient called requesting a call ASAP. Attempted to return call x3, message received due to network error call can not go through.

## 2020-09-16 ENCOUNTER — Emergency Department (HOSPITAL_BASED_OUTPATIENT_CLINIC_OR_DEPARTMENT_OTHER)
Admit: 2020-09-16 | Discharge: 2020-09-16 | Disposition: A | Discharge status: Home / Self Care | Payer: Medicare Other | Attending: Emergency Medicine | Admitting: Emergency Medicine

## 2020-09-16 ENCOUNTER — Other Ambulatory Visit: Payer: Self-pay

## 2020-09-16 ENCOUNTER — Encounter (HOSPITAL_COMMUNITY): Payer: Self-pay

## 2020-09-16 ENCOUNTER — Emergency Department (HOSPITAL_COMMUNITY)
Admission: EM | Admit: 2020-09-16 | Discharge: 2020-09-16 | Disposition: A | Discharge status: Home / Self Care | Payer: Medicare Other | Attending: Emergency Medicine | Admitting: Emergency Medicine

## 2020-09-16 DIAGNOSIS — Z853 Personal history of malignant neoplasm of breast: Secondary | ICD-10-CM | POA: Diagnosis not present

## 2020-09-16 DIAGNOSIS — R2241 Localized swelling, mass and lump, right lower limb: Secondary | ICD-10-CM | POA: Diagnosis not present

## 2020-09-16 DIAGNOSIS — Z79899 Other long term (current) drug therapy: Secondary | ICD-10-CM | POA: Diagnosis not present

## 2020-09-16 DIAGNOSIS — Z87891 Personal history of nicotine dependence: Secondary | ICD-10-CM | POA: Diagnosis not present

## 2020-09-16 DIAGNOSIS — M7989 Other specified soft tissue disorders: Secondary | ICD-10-CM

## 2020-09-16 DIAGNOSIS — Z8616 Personal history of COVID-19: Secondary | ICD-10-CM | POA: Diagnosis not present

## 2020-09-16 DIAGNOSIS — I10 Essential (primary) hypertension: Secondary | ICD-10-CM | POA: Diagnosis not present

## 2020-09-16 DIAGNOSIS — R609 Edema, unspecified: Secondary | ICD-10-CM | POA: Diagnosis not present

## 2020-09-16 DIAGNOSIS — C50911 Malignant neoplasm of unspecified site of right female breast: Secondary | ICD-10-CM

## 2020-09-16 LAB — CBC WITH DIFFERENTIAL/PLATELET
Abs Immature Granulocytes: 0.08 10*3/uL — ABNORMAL HIGH (ref 0.00–0.07)
Basophils Absolute: 0 10*3/uL (ref 0.0–0.1)
Basophils Relative: 0 %
Eosinophils Absolute: 0 10*3/uL (ref 0.0–0.5)
Eosinophils Relative: 0 %
HCT: 33.2 % — ABNORMAL LOW (ref 36.0–46.0)
Hemoglobin: 10.2 g/dL — ABNORMAL LOW (ref 12.0–15.0)
Immature Granulocytes: 1 %
Lymphocytes Relative: 13 %
Lymphs Abs: 1.3 10*3/uL (ref 0.7–4.0)
MCH: 28 pg (ref 26.0–34.0)
MCHC: 30.7 g/dL (ref 30.0–36.0)
MCV: 91.2 fL (ref 80.0–100.0)
Monocytes Absolute: 0.3 10*3/uL (ref 0.1–1.0)
Monocytes Relative: 3 %
Neutro Abs: 8.4 10*3/uL — ABNORMAL HIGH (ref 1.7–7.7)
Neutrophils Relative %: 83 %
Platelets: 285 10*3/uL (ref 150–400)
RBC: 3.64 MIL/uL — ABNORMAL LOW (ref 3.87–5.11)
RDW: 19.4 % — ABNORMAL HIGH (ref 11.5–15.5)
WBC: 10.1 10*3/uL (ref 4.0–10.5)
nRBC: 0 % (ref 0.0–0.2)

## 2020-09-16 LAB — COMPREHENSIVE METABOLIC PANEL
ALT: 16 U/L (ref 0–44)
AST: 25 U/L (ref 15–41)
Albumin: 3.3 g/dL — ABNORMAL LOW (ref 3.5–5.0)
Alkaline Phosphatase: 80 U/L (ref 38–126)
Anion gap: 9 (ref 5–15)
BUN: 15 mg/dL (ref 8–23)
CO2: 28 mmol/L (ref 22–32)
Calcium: 9.5 mg/dL (ref 8.9–10.3)
Chloride: 105 mmol/L (ref 98–111)
Creatinine, Ser: 0.84 mg/dL (ref 0.44–1.00)
GFR, Estimated: >60 mL/min (ref >60)
Glucose, Bld: 111 mg/dL — ABNORMAL HIGH (ref 70–99)
Potassium: 3.9 mmol/L (ref 3.5–5.1)
Sodium: 142 mmol/L (ref 135–145)
Total Bilirubin: 0.2 mg/dL — ABNORMAL LOW (ref 0.3–1.2)
Total Protein: 6.7 g/dL (ref 6.5–8.1)

## 2020-09-16 MED ORDER — CEPHALEXIN 500 MG PO CAPS: 500.0000 mg | ORAL_CAPSULE | Freq: Three times a day (TID) | ORAL | 0 refills | Status: AC

## 2020-09-16 MED ORDER — CEPHALEXIN 500 MG PO CAPS
500.0000 mg | ORAL_CAPSULE | Freq: Once | ORAL | Status: AC
  Administered 2020-09-16: 500 mg via ORAL
  Filled 2020-09-16: qty 1

## 2020-09-16 NOTE — ED Notes (Signed)
Pt ambulatory from restroom to room 19 w/out assistance. Pt had an episode of incontinence while in the bathroom, so she was given mesh panties and pads to change into.  Pt requested privacy while changing in her room. Will check back and connect to v/s monitoring.

## 2020-09-16 NOTE — Progress Notes (Signed)
Right lower extremity venous duplex has been completed. Preliminary results can be found in CV Proc through chart review.  Results were given to Martinique Robinson PA.  09/16/20 4:51 PM [name] RVT

## 2020-09-16 NOTE — ED Triage Notes (Signed)
Pt presents with c/o fall that occurred on Saturday night. Pt reports she did hit her head but no LOC. Pt c/o bilateral leg swelling, unrelated to the fall, but reports scratches on her legs from the fall.

## 2020-09-16 NOTE — ED Provider Notes (Signed)
[facility] DEPT Provider Note   CSN: [id number] Arrival date & time: 09/16/20  1329     History Chief Complaint  Patient presents with  . Fall  . Leg Swelling    [name] is a 64 y.o. female w PMHx DVT, HTN, breast cancer, COVID 46,  presenting with right leg swelling. This is recurring, current episode started today. It is worsening throughout the day with associated pain from knee down to toes.  States that she fell on Saturday due to mechanical fall, she scraped her forehead and the fall without LOC.  She also. Has some scrapes on her knees from the fall. Denies headache, vision changes, nausea, vomiting, neck or back pain from the fall. Not on anticoagulation.  She is in between chemo therapy/radiation treatments for her metastatic breast cancer.  She states she is to begin another cycle on Friday.  Her oncologist is Dr. Jana Hakim.  The history is provided by the patient and medical records.       Past Medical History:  Diagnosis Date  . Breast cancer ([place])   . DVT (deep venous thrombosis) ([place]) 10/07/2011  . Fever 02/06/2018  . Hypertension   . Radiation 11/29/2005-12/29/2005   right supraclavicular area 4147 cGy  . Radiation 06/26/02-08/14/02   right breast 5040 cGy, tumor bed boosted to 1260 cGy  . Rheumatic fever     Patient Active Problem List   Diagnosis Date Noted  . Seizure disorder, focal motor ([place]) 07/22/2020  . Hypomagnesemia 07/22/2020  . Cerebral edema ([place]) 07/22/2020  . Depression with anxiety 06/26/2020  . Substance abuse ([place]) 06/12/2020  . Urinary frequency 06/12/2020  . Other insomnia 06/12/2020  . Cancer of right breast metastatic to brain ([place]) 03/16/2020  . Nausea & vomiting 02/29/2020  . UTI (urinary tract infection) 02/11/2020  . Acute metabolic encephalopathy 30/07/2329  . Generalized weakness 02/11/2020  . Hypokalemia 02/11/2020  . Paresis of left lower extremity ([place]) 02/11/2020  . Paresis of right lower  extremity ([place]) 02/11/2020  . Drug-induced polyneuropathy ([place]) 12/19/2019  . Acute respiratory failure with hypoxia ([place]) 11/19/2019  . COVID-19 virus infection 11/19/2019  . Overdose opiate, accidental or unintentional, initial encounter ([place]) 11/19/2019  . Acute encephalopathy 11/19/2019  . Metastasis to brain ([place]) 09/13/2019  . Acute lower UTI 02/06/2018  . Altered mental status 02/06/2018  . Bacteremia 02/06/2018  . Polypharmacy 02/06/2018  . Goals of care, counseling/discussion 09/05/2017  . Pneumonia 01/25/2017  . CAP (community acquired pneumonia) 01/23/2017  . Port catheter in place 05/30/2016  . 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 Chronic pain 04/13/2015  . Left arm pain 08/18/2014  . Malignant neoplasm of upper-inner quadrant of right breast in female, estrogen receptor positive ([place]) 10/03/2013  . Post-lymphadenectomy lymphedema of arm 08/15/2013  . Port or reservoir infection 01/29/2013  . DVT (deep venous thrombosis) ([place]) 10/07/2011  . Chest pain 09/10/2009  . Secondary cardiomyopathy ([place]) 09/02/2009  . Essential hypertension 02/26/2009  . GERD 02/26/2009    Past Surgical History:  Procedure Laterality Date  . BREAST SURGERY    . MASTECTOMY Bilateral   . PORT A CATH REVISION       OB History   No obstetric history on file.     Family History  Problem Relation Age of Onset  . Pancreatic cancer Mother   . Kidney cancer Sister 65  . Breast cancer Sister 81  . Breast cancer Other 7    Social History  Tobacco Use  . Smoking status: Former Research scientist (life sciences)  . Smokeless tobacco: Never Used  Vaping Use  . Vaping Use: Never used  Substance Use Topics  . Alcohol use: Never    Comment: Rarely  . Drug use: No    Home Medications Prior to Admission medications   Medication Sig Start Date End Date Taking? Authorizing Provider  acetaminophen (TYLENOL) 325 MG tablet Take 2 tablets (650 mg total) by mouth every 6 (six) hours as  needed for mild pain (or Fever >/= 101). Patient not taking: Reported on 07/21/2020 03/24/20   Georgette Shell, MD  allopurinol (ZYLOPRIM) 300 MG tablet Take 300 mg by mouth daily. Patient not taking: Reported on 06/12/2020 01/20/20   [provider]  ALPRAZolam Duanne Moron) 1 MG tablet Take 1 mg by mouth daily as needed for anxiety.    [provider]  cephALEXin (KEFLEX) 500 MG capsule Take 1 capsule (500 mg total) by mouth 2 (two) times daily. 2 caps po bid x 7 days 08/20/20   [name], [name], MD  cephALEXin (KEFLEX) 500 MG capsule Take 1 capsule (500 mg total) by mouth 3 (three) times daily for 7 days. 09/16/20 09/23/20  Emnet Monk, [place] N, PA-C  dexamethasone (DECADRON) 4 MG tablet Take 1 tablet (4 mg total) by mouth daily. 08/20/20   [name], [name], MD  furosemide (LASIX) 20 MG tablet Take 1 tablet (20 mg total) by mouth daily. 08/20/20   [name], [name], MD  iron polysaccharides (NIFEREX) 150 MG capsule Take 1 capsule (150 mg total) by mouth daily. Patient not taking: Reported on 06/12/2020 03/02/20   Raiford Noble Latif, DO  levETIRAcetam (KEPPRA) 750 MG tablet TAKE 1 TABLET BY MOUTH 2 TIMES DAILY. 09/11/20   [name], [name], MD  methadone (DOLOPHINE) 10 MG tablet Take 10 mg by mouth in the morning, at noon, and at bedtime.  08/08/20   [provider]  Multiple Vitamin (MULTIVITAMIN) capsule Take 1 capsule by mouth daily.      [provider]  mupirocin ointment (BACTROBAN) 2 % Place 1 application into the nose 2 (two) times daily. Patient not taking: Reported on 06/12/2020 03/24/20   Georgette Shell, MD  [facility] 4 MG/0.1ML LIQD nasal spray kit 1 spray as directed. 07/28/20   [provider]  omeprazole (PRILOSEC) 40 MG capsule Take 1 capsule (40 mg total) by mouth daily. Patient not taking: Reported on 06/12/2020 02/04/20   [name], [name], MD  ondansetron (ZOFRAN) 4 MG tablet Take 1 tablet (4 mg total) by mouth 3 (three) times daily as  needed. 07/24/20   Gardenia Phlegm, NP  Potassium Chloride ER 20 MEQ TBCR Take 1 tablet by mouth daily. 07/23/20   Desiree Hane, MD  prochlorperazine (COMPAZINE) 10 MG tablet Take 1 tablet (10 mg total) by mouth every 6 (six) hours as needed for nausea or vomiting. 08/26/20   [name], [name], MD  traZODone (DESYREL) 50 MG tablet Take 25-50 mg by mouth at bedtime.    [provider]  tucatinib (TUKYSA) 50 MG tablet Take 3 tablets (150 mg total) by mouth in the morning. Take as directed by MD. 09/03/20   [name], [name], MD    Allergies    Tramadol, Hydrocodone-acetaminophen, and Pork-derived products  Review of Systems   Review of Systems  Constitutional: Negative for fever.  Cardiovascular: Positive for leg swelling.  Gastrointestinal: Negative for vomiting.  Musculoskeletal: Negative for back pain and neck pain.  Allergic/Immunologic: Positive for  immunocompromised state.  Neurological: Negative for syncope and headaches.  All other systems reviewed and are negative.   Physical Exam Updated Vital Signs BP (!) 142/100   Pulse 66   Temp 99.2 F (37.3 C)   Resp 20   SpO2 98%   Physical Exam Vitals and nursing note reviewed.  Constitutional:      General: She is not in acute distress.    Appearance: She is well-developed.  HENT:     Head: Normocephalic.     Comments: Small pea-sized scabbed abrasion above right brow. No swelling or hematoma.  Eyes:     Extraocular Movements: Extraocular movements intact.     Conjunctiva/sclera: Conjunctivae normal.     Pupils: Pupils are equal, round, and reactive to light.  Cardiovascular:     Rate and Rhythm: Normal rate and regular rhythm.  Pulmonary:     Effort: Pulmonary effort is normal. No respiratory distress.     Breath sounds: Normal breath sounds.  Abdominal:     Palpations: Abdomen is soft.  Musculoskeletal:     Comments: Right leg with diffuse significant edema.  Tenderness to the calf and dorsal  foot.  There is some very slight redness noted to the dorsal foot.  No fluctuance or wounds.  Intact DP pulses. Scabbed abrasions to b/l anterior knees  Skin:    General: Skin is warm.  Neurological:     Mental Status: She is alert.     Comments: Cranial nerves grossly intact. spontaneously moving all extremities with purposeful movement  Psychiatric:        Behavior: Behavior normal.     ED Results / Procedures / Treatments   Labs (all labs ordered are listed, but only abnormal results are displayed) Labs Reviewed  COMPREHENSIVE METABOLIC PANEL - Abnormal; Notable for the following components:      Result Value   Glucose, Bld 111 (*)    Albumin 3.3 (*)    Total Bilirubin 0.2 (*)    All other components within normal limits  CBC WITH DIFFERENTIAL/PLATELET - Abnormal; Notable for the following components:   RBC 3.64 (*)    Hemoglobin 10.2 (*)    HCT 33.2 (*)    RDW 19.4 (*)    Neutro Abs 8.4 (*)    Abs Immature Granulocytes 0.08 (*)    All other components within normal limits    EKG None  Radiology VAS [name] LOWER EXTREMITY VENOUS (DVT) (ONLY [facility] & [facility] 7a-7p)  Result Date: 09/16/2020  Lower Venous DVT Study Indications: Edema.  Risk Factors: None identified. Limitations: Body habitus and poor ultrasound/tissue interface. Comparison Study: No prior studies. Performing Technologist: Oliver Hum RVT  Examination Guidelines: A complete evaluation includes B-mode imaging, spectral Doppler, color Doppler, and power Doppler as needed of all accessible portions of each vessel. Bilateral testing is considered an integral part of a complete examination. Limited examinations for reoccurring indications may be performed as noted. The reflux portion of the exam is performed with the patient in reverse Trendelenburg.  +---------+---------------+---------+-----------+----------+--------------+ RIGHT    CompressibilityPhasicitySpontaneityPropertiesThrombus Aging  +---------+---------------+---------+-----------+----------+--------------+ CFV      Full           Yes      Yes                                 +---------+---------------+---------+-----------+----------+--------------+ SFJ      Full                                                        +---------+---------------+---------+-----------+----------+--------------+  FV Prox  Full                                                        +---------+---------------+---------+-----------+----------+--------------+ FV Mid                  Yes      Yes                                 +---------+---------------+---------+-----------+----------+--------------+ FV Distal               Yes      Yes                                 +---------+---------------+---------+-----------+----------+--------------+ PFV      Full                                                        +---------+---------------+---------+-----------+----------+--------------+ POP      Full           Yes      Yes                                 +---------+---------------+---------+-----------+----------+--------------+ PTV      Full                                                        +---------+---------------+---------+-----------+----------+--------------+ PERO     Full                                                        +---------+---------------+---------+-----------+----------+--------------+ Soleal   Full                                                        +---------+---------------+---------+-----------+----------+--------------+   +----+---------------+---------+-----------+----------+--------------+ LEFTCompressibilityPhasicitySpontaneityPropertiesThrombus Aging +----+---------------+---------+-----------+----------+--------------+ CFV Full           Yes      Yes                                  +----+---------------+---------+-----------+----------+--------------+     Summary: RIGHT: - There is no evidence of deep vein thrombosis in the lower extremity. However, portions of this examination were limited- see technologist comments above.  - No cystic structure found in the popliteal fossa.  LEFT: - No evidence of common femoral vein obstruction.  *See table(s) above for measurements and observations.  Preliminary     Procedures Procedures (including critical care time)  Medications Ordered in ED Medications  cephALEXin (KEFLEX) capsule 500 mg (500 mg Oral Given 09/16/20 1815)    ED Course  I have reviewed the triage vital signs and the nursing notes.  Pertinent labs & imaging results that were available during my care of the patient were reviewed by me and considered in my medical decision making (see chart for details).  Clinical Course as of Sep 17 2039  Wed Sep 16, 2020  8413 Discussed with oncologist, Dr. Alvy Bimler, colleagues with Dr. Jana Hakim.  Agrees with care plan.   [JR]    Clinical Course User Index [JR] Rayhan Groleau, [place] N, PA-C   MDM Rules/Calculators/A&P                          Patient with history of metastatic breast cancer, currently between cycles of chemotherapy and radiation treatment, presenting with recurrent right leg swelling.  Swelling began today, she has diffuse edema with some slight increase warmth, and very slight redness to the foot.  No fevers or chills.  Patient has overall a poor historian. Per chart review, patient has been evaluated multiple times for leg swelling and has had multiple venous ultrasounds of her legs.  Most recent visit was in October with negative ultrasound, however she did have leukocytosis.  She was treated with Keflex and Lasix with improvement.   Venous ultrasound today is again negative.  Labs are reassuring with white blood cell count within normal limits.  Overall unremarkable.  Patient discussed with and evaluated by  Dr. Roderic Palau.  Will cover with Keflex though seems less likely cellulitis, elevation, and recommend close outpatient follow-up with her oncologist.    Message sent to Dr. Jana Hakim to inform him of patient's visit in the ED tonight and need for close follow up.  Final Clinical Impression(s) / ED Diagnoses Final diagnoses:  Right leg swelling    Rx / DC Orders ED Discharge Orders         Ordered    cephALEXin (KEFLEX) 500 MG capsule  3 times daily        09/16/20 1932           Chanceler Pullin, [place] N, PA-C 09/16/20 2042    Milton Ferguson, MD 09/16/20 2105

## 2020-09-16 NOTE — Discharge Instructions (Addendum)
The ultrasound of your leg was normal - no evidence of a blood clot. Please take the antibiotic, keflex, three times daily until gone. Elevate your leg to help with swelling. Call Dr. [name] office in the morning to schedule a close follow up appointment.  Return if you develop fever.

## 2020-09-17 ENCOUNTER — Other Ambulatory Visit: Payer: Self-pay | Admitting: *Deleted

## 2020-09-17 ENCOUNTER — Telehealth: Payer: Self-pay | Admitting: *Deleted

## 2020-09-17 DIAGNOSIS — C50911 Malignant neoplasm of unspecified site of right female breast: Secondary | ICD-10-CM

## 2020-09-17 NOTE — Telephone Encounter (Signed)
LM for daughter with phone number to [employer]. MRI has been approved. She can call to schedule the appt at her convenience

## 2020-09-18 ENCOUNTER — Inpatient Hospital Stay: Payer: Medicare Other | Admitting: Nutrition

## 2020-09-18 ENCOUNTER — Inpatient Hospital Stay: Payer: Medicare Other | Attending: Oncology

## 2020-09-18 ENCOUNTER — Inpatient Hospital Stay: Payer: Medicare Other

## 2020-09-18 ENCOUNTER — Telehealth: Payer: Self-pay

## 2020-09-18 ENCOUNTER — Encounter: Payer: Self-pay | Admitting: Licensed Clinical Social Worker

## 2020-09-18 ENCOUNTER — Other Ambulatory Visit: Payer: Self-pay

## 2020-09-18 ENCOUNTER — Encounter: Payer: Self-pay | Admitting: Adult Health

## 2020-09-18 ENCOUNTER — Inpatient Hospital Stay (HOSPITAL_BASED_OUTPATIENT_CLINIC_OR_DEPARTMENT_OTHER): Payer: Medicare Other | Admitting: Adult Health

## 2020-09-18 VITALS — BP 124/63 | HR 73 | Temp 97.2°F | Resp 18 | Ht 64.0 in | Wt 179.4 lb

## 2020-09-18 DIAGNOSIS — C50211 Malignant neoplasm of upper-inner quadrant of right female breast: Secondary | ICD-10-CM | POA: Insufficient documentation

## 2020-09-18 DIAGNOSIS — Z7952 Long term (current) use of systemic steroids: Secondary | ICD-10-CM | POA: Diagnosis not present

## 2020-09-18 DIAGNOSIS — Z87891 Personal history of nicotine dependence: Secondary | ICD-10-CM | POA: Diagnosis not present

## 2020-09-18 DIAGNOSIS — Z8051 Family history of malignant neoplasm of kidney: Secondary | ICD-10-CM | POA: Insufficient documentation

## 2020-09-18 DIAGNOSIS — Z5111 Encounter for antineoplastic chemotherapy: Secondary | ICD-10-CM | POA: Insufficient documentation

## 2020-09-18 DIAGNOSIS — C7931 Secondary malignant neoplasm of brain: Secondary | ICD-10-CM | POA: Insufficient documentation

## 2020-09-18 DIAGNOSIS — Z923 Personal history of irradiation: Secondary | ICD-10-CM | POA: Insufficient documentation

## 2020-09-18 DIAGNOSIS — Z9013 Acquired absence of bilateral breasts and nipples: Secondary | ICD-10-CM | POA: Insufficient documentation

## 2020-09-18 DIAGNOSIS — Z170 Estrogen receptor positive status [ER+]: Secondary | ICD-10-CM

## 2020-09-18 DIAGNOSIS — I890 Lymphedema, not elsewhere classified: Secondary | ICD-10-CM | POA: Diagnosis not present

## 2020-09-18 DIAGNOSIS — Z5112 Encounter for antineoplastic immunotherapy: Secondary | ICD-10-CM | POA: Diagnosis present

## 2020-09-18 DIAGNOSIS — G8929 Other chronic pain: Secondary | ICD-10-CM | POA: Diagnosis not present

## 2020-09-18 DIAGNOSIS — C770 Secondary and unspecified malignant neoplasm of lymph nodes of head, face and neck: Secondary | ICD-10-CM | POA: Diagnosis not present

## 2020-09-18 DIAGNOSIS — C4911 Malignant neoplasm of connective and soft tissue of right upper limb, including shoulder: Secondary | ICD-10-CM

## 2020-09-18 DIAGNOSIS — Z86718 Personal history of other venous thrombosis and embolism: Secondary | ICD-10-CM | POA: Diagnosis not present

## 2020-09-18 DIAGNOSIS — Z800 Family history of malignant neoplasm of digestive organs: Secondary | ICD-10-CM | POA: Insufficient documentation

## 2020-09-18 DIAGNOSIS — Z803 Family history of malignant neoplasm of breast: Secondary | ICD-10-CM | POA: Diagnosis not present

## 2020-09-18 DIAGNOSIS — C50911 Malignant neoplasm of unspecified site of right female breast: Secondary | ICD-10-CM

## 2020-09-18 DIAGNOSIS — Z9221 Personal history of antineoplastic chemotherapy: Secondary | ICD-10-CM | POA: Diagnosis not present

## 2020-09-18 DIAGNOSIS — Z171 Estrogen receptor negative status [ER-]: Secondary | ICD-10-CM | POA: Insufficient documentation

## 2020-09-18 DIAGNOSIS — Z8616 Personal history of COVID-19: Secondary | ICD-10-CM | POA: Insufficient documentation

## 2020-09-18 DIAGNOSIS — Z95828 Presence of other vascular implants and grafts: Secondary | ICD-10-CM

## 2020-09-18 LAB — CMP (CANCER CENTER ONLY)
ALT: 10 U/L (ref 0–44)
AST: 21 U/L (ref 15–41)
Albumin: 3 g/dL — ABNORMAL LOW (ref 3.5–5.0)
Alkaline Phosphatase: 87 U/L (ref 38–126)
Anion gap: 9 (ref 5–15)
BUN: 10 mg/dL (ref 8–23)
CO2: 28 mmol/L (ref 22–32)
Calcium: 9.1 mg/dL (ref 8.9–10.3)
Chloride: 105 mmol/L (ref 98–111)
Creatinine: 0.86 mg/dL (ref 0.44–1.00)
GFR, Estimated: >60 mL/min (ref >60)
Glucose, Bld: 107 mg/dL — ABNORMAL HIGH (ref 70–99)
Potassium: 3.5 mmol/L (ref 3.5–5.1)
Sodium: 142 mmol/L (ref 135–145)
Total Bilirubin: 0.4 mg/dL (ref 0.3–1.2)
Total Protein: 6.4 g/dL — ABNORMAL LOW (ref 6.5–8.1)

## 2020-09-18 LAB — CBC WITH DIFFERENTIAL (CANCER CENTER ONLY)
Abs Immature Granulocytes: 0.13 10*3/uL — ABNORMAL HIGH (ref 0.00–0.07)
Basophils Absolute: 0.1 10*3/uL (ref 0.0–0.1)
Basophils Relative: 1 %
Eosinophils Absolute: 0.2 10*3/uL (ref 0.0–0.5)
Eosinophils Relative: 2 %
HCT: 33.2 % — ABNORMAL LOW (ref 36.0–46.0)
Hemoglobin: 10.8 g/dL — ABNORMAL LOW (ref 12.0–15.0)
Immature Granulocytes: 1 %
Lymphocytes Relative: 23 %
Lymphs Abs: 2.3 10*3/uL (ref 0.7–4.0)
MCH: 28 pg (ref 26.0–34.0)
MCHC: 32.5 g/dL (ref 30.0–36.0)
MCV: 86 fL (ref 80.0–100.0)
Monocytes Absolute: 0.9 10*3/uL (ref 0.1–1.0)
Monocytes Relative: 9 %
Neutro Abs: 6.4 10*3/uL (ref 1.7–7.7)
Neutrophils Relative %: 64 %
Platelet Count: 351 10*3/uL (ref 150–400)
RBC: 3.86 MIL/uL — ABNORMAL LOW (ref 3.87–5.11)
RDW: 18.8 % — ABNORMAL HIGH (ref 11.5–15.5)
WBC Count: 10.1 10*3/uL (ref 4.0–10.5)
nRBC: 0 % (ref 0.0–0.2)

## 2020-09-18 MED ORDER — SODIUM CHLORIDE 0.9 % IV SOLN
420.0000 mg | Freq: Once | INTRAVENOUS | Status: AC
  Administered 2020-09-18: 420 mg via INTRAVENOUS
  Filled 2020-09-18: qty 14

## 2020-09-18 MED ORDER — ACETAMINOPHEN 325 MG PO TABS
ORAL_TABLET | ORAL | Status: AC
  Filled 2020-09-18: qty 2

## 2020-09-18 MED ORDER — ACETAMINOPHEN 325 MG PO TABS
650.0000 mg | ORAL_TABLET | Freq: Once | ORAL | Status: AC
  Administered 2020-09-18: 650 mg via ORAL

## 2020-09-18 MED ORDER — DIPHENHYDRAMINE HCL 25 MG PO CAPS
ORAL_CAPSULE | ORAL | Status: AC
  Filled 2020-09-18: qty 1

## 2020-09-18 MED ORDER — PROCHLORPERAZINE MALEATE 10 MG PO TABS
10.0000 mg | ORAL_TABLET | Freq: Once | ORAL | Status: AC
  Administered 2020-09-18: 10 mg via ORAL

## 2020-09-18 MED ORDER — SODIUM CHLORIDE 0.9 % IV SOLN
Freq: Once | INTRAVENOUS | Status: AC
  Filled 2020-09-18: qty 250

## 2020-09-18 MED ORDER — OXYCODONE HCL 5 MG PO TABS
5.0000 mg | ORAL_TABLET | Freq: Once | ORAL | Status: AC
  Administered 2020-09-18: 5 mg via ORAL

## 2020-09-18 MED ORDER — PROCHLORPERAZINE MALEATE 10 MG PO TABS
ORAL_TABLET | ORAL | Status: AC
  Filled 2020-09-18: qty 1

## 2020-09-18 MED ORDER — DIPHENHYDRAMINE HCL 25 MG PO CAPS
25.0000 mg | ORAL_CAPSULE | Freq: Once | ORAL | Status: AC
  Administered 2020-09-18: 25 mg via ORAL

## 2020-09-18 MED ORDER — HEPARIN SOD (PORK) LOCK FLUSH 100 UNIT/ML IV SOLN
500.0000 [IU] | Freq: Once | INTRAVENOUS | Status: AC | PRN
  Administered 2020-09-18: 500 [IU]
  Filled 2020-09-18: qty 5

## 2020-09-18 MED ORDER — TRASTUZUMAB-ANNS CHEMO 150 MG IV SOLR
450.0000 mg | Freq: Once | INTRAVENOUS | Status: AC
  Administered 2020-09-18: 450 mg via INTRAVENOUS
  Filled 2020-09-18: qty 21.43

## 2020-09-18 MED ORDER — SODIUM CHLORIDE 0.9% FLUSH
10.0000 mL | INTRAVENOUS | Status: DC | PRN
  Administered 2020-09-18: 10 mL
  Filled 2020-09-18: qty 10

## 2020-09-18 MED ORDER — ACETAMINOPHEN 325 MG PO TABS: 325.0000 mg | ORAL_TABLET | Freq: Once | ORAL | Status: AC

## 2020-09-18 MED ORDER — OXYCODONE HCL 5 MG PO TABS
ORAL_TABLET | ORAL | Status: AC
  Filled 2020-09-18: qty 1

## 2020-09-18 MED ORDER — SODIUM CHLORIDE 0.9% FLUSH
10.0000 mL | Freq: Once | INTRAVENOUS | Status: AC
  Administered 2020-09-18: 10 mL
  Filled 2020-09-18: qty 10

## 2020-09-18 MED ORDER — PACLITAXEL PROTEIN-BOUND CHEMO INJECTION 100 MG
100.0000 mg/m2 | Freq: Once | INTRAVENOUS | Status: AC
  Administered 2020-09-18: 200 mg via INTRAVENOUS
  Filled 2020-09-18: qty 40

## 2020-09-18 NOTE — Progress Notes (Signed)
Patient was identified to be at risk for malnutrition on the MST secondary to poor appetite and weight loss.  Patient is a 64 year old female diagnosed with breast cancer followed by Dr. Jana Hakim.  Past medical history includes radiation, hypertension, and DVT.  Medications include Xanax, Lasix, Prilosec, Zofran, and Compazine.  Labs include glucose 114.  Height: 5 feet 4 inches. Weight: 179.4 pounds today, improved from 162.2 pounds October 14. Usual body weight: 200 pounds in May 2021. BMI: 30.79.  Patient denies any nutrition impact symptoms at this time. She is eating well. She has gained weight since chart review. She has no questions or concerns.  Encourage patient to continue strategies for adequate calorie and protein intake to maintain weight. Provided contact information in case patient had questions after today's visit. No follow-up scheduled at this time.  **Disclaimer: This note was dictated with voice recognition software. Similar sounding words can inadvertently be transcribed and this note may contain transcription errors which may not have been corrected upon publication of note.**

## 2020-09-18 NOTE — Progress Notes (Addendum)
[name] CSW Progress Note  Clinical Education officer, museum received request from NP to help coordinate with pt's daughter due to confusion on medications. CSW spoke with pt's daughter, [name], and discussed importance of her being at the next appt with her mom. [name] expressed understanding and will work with her mom to schedule at a time when she can attend.  Per daughter, she does not think pt has connected with a new PCP yet. CSW e-mailed information on local offices that may be able to take her mom and daughter will call them to schedule. Also provided information on [facility] and can make a referral if family is interested.  [name] also requested information on any activities to help her mom stay engaged during the day. CSW provided information on programs through [facility], [employer], and [employer] [facility] [place].    Christeen Douglas , LCSW

## 2020-09-18 NOTE — Patient Instructions (Signed)
[facility] Discharge Instructions for Patients Receiving Chemotherapy  Today you received the following chemotherapy agents: Trastuzumab, Perjeta, and Abraxane  To help prevent nausea and vomiting after your treatment, we encourage you to take your nausea medication  as prescribed.    If you develop nausea and vomiting that is not controlled by your nausea medication, call the clinic.   BELOW ARE SYMPTOMS THAT SHOULD BE REPORTED IMMEDIATELY:  *FEVER GREATER THAN 100.5 F  *CHILLS WITH OR WITHOUT FEVER  NAUSEA AND VOMITING THAT IS NOT CONTROLLED WITH YOUR NAUSEA MEDICATION  *UNUSUAL SHORTNESS OF BREATH  *UNUSUAL BRUISING OR BLEEDING  TENDERNESS IN MOUTH AND THROAT WITH OR WITHOUT PRESENCE OF ULCERS  *URINARY PROBLEMS  *BOWEL PROBLEMS  UNUSUAL RASH Items with * indicate a potential emergency and should be followed up as soon as possible.  Feel free to call the clinic should you have any questions or concerns. The clinic phone number is [phone].  Please show the [facility] at check-in to the Emergency Department and triage nurse.

## 2020-09-18 NOTE — Progress Notes (Addendum)
[name]  Telephone:([phone]) [phone] Fax:([phone]) [phone]    ID: [name]   DOB: Mar 08, 1956  MR#: [id number]  OZD#:[id number]  Patient Care Team: Patient, No Pcp Per as PCP - General (General Practice) [name], [name] Dad, MD as Consulting Physician (Oncology) [name] Pita, MD as Consulting Physician (Radiation Oncology) [name], [name] Pascal, MD as Consulting Physician (Cardiology) Mickeal [name], [name] Lav, MD as Consulting Physician (Psychiatry) [name] Hale, MD as Consulting Physician (Anesthesiology) [name] Garter, MD as Consulting Physician (Internal Medicine) SU: [name]. [name], [name] 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   She receives Abraxane/trastuzumab/Pertuzumab monthly.  Her most recent treatment was 07/24/2020.  Her most recent echocardiogram on 12/25/2019 showed an ejection fraction of 60-65%.  She has an echocardiogram scheduled on 09/30/2020.  She has MRI brain scheduled for 11/16 and no f/u with Dr. Mickeal [name].    She sees Dr. Royce [name] for her pain.  She was prescribed Methadone on 11/1 and had it filled.  She notes she ran out of this.  We had requested [name] [name] attend this appointment.  She unfortunately was unable to.     REVIEW OF SYSTEMS: [name] denies fever.  She has right arm lymphedema and right leg swelling.  She has undergone two dopplers of each lower extremity, one in October and one in November that were negative for DVT.  She is prescribed lasix.  I called her daughter [name] who could not verify her medications.  She thinks she is taking certain medications but is not 063% certain.  She would like some help with managing her  medications.    [name]'s biggest complaint is her pain.  She wants some pain medication to get through her treatment today.  Otherwise she says she is doing well and has no issues.    BREAST CANCER HISTORY: From the original intake note:  The patient originally presented in May 2003 when she noticed a lump in the upper inner quadrant of her right breast in September 2003.  She sought attention and had a mammogram which showed an obvious carcinoma in the upper inner quadrant of the right breast, approximately 2 cm.  There were some enlarged lymph nodes in the axilla and an FNA done showed those consistent with malignant cells, most likely an invasive ductal carcinoma.  At that point she was unsure of what to do and was referred by Dr. Marylene [name] for a discussion of her treatment options.  By biopsy, it was ER/PR negative and HER-2 was 3+.  DNA index was 1.42.  We reiterated that it was most important to have her disease surgically addressed at that point and had recommended lumpectomy and axillary nodes to be addressed with which she followed through and on 04-03-02, Dr. Annamaria [name] performed a right partial mastectomy and a right axillary lymph node dissection.  Final pathology revealed a 2.4 cm. high grade, Grade III invasive ductal carcinoma with an adjacent .8 cm. also high grade invasive ductal carcinoma which was felt to represent an intramammary metastasis rather than a second primary.  A smaller mass was just medial to the larger mass.  The smaller mass was associated with high grade DCIS component.  There was no definite lymphovascular invasion identified.  However, one of fourteen  axillary lymph nodes did contain a 1.5 cm. metastatic deposit.  Postoperatively she did very well.  We reiterated the fact that she did need adjuvant chemotherapy, however, she refused and decided that she would pursue radiation.  She received radiation and completed that on 08-14-02. Her subsequent history is as detailed  below.   PAST MEDICAL HISTORY: Past Medical History:  Diagnosis Date  . Breast cancer ([name])   . DVT (deep venous thrombosis) ([name]) 10/07/2011  . Fever 02/06/2018  . Hypertension   . Radiation 11/29/2005-12/29/2005   right supraclavicular area 4147 cGy  . Radiation 06/26/02-08/14/02   right breast 5040 cGy, tumor bed boosted to 1260 cGy  . Rheumatic fever     PAST SURGICAL HISTORY: Past Surgical History:  Procedure Laterality Date  . BREAST SURGERY    . MASTECTOMY Bilateral   . PORT A CATH REVISION      FAMILY HISTORY Family History  Problem Relation Age of Onset  . Pancreatic cancer Mother   . Kidney cancer Sister 81  . Breast cancer Sister 47  . Breast cancer Other 77  The patient's father died in his 40J  from complications of alcohol abuse. The patient's mother died in her 49s from pancreatic cancer. The patient has 6 brothers, 2 sisters. Both her sisters have had breast cancer, both diagnosed after the age of 12. The patient has not had genetic testing so far.   GYNECOLOGIC HISTORY: Menarche age 75; first live birth age 14. She is GXP4. She is not sure when she stopped having periods. She never used hormone replacement.   SOCIAL HISTORY: The patient has worked in the past as a Psychologist, counselling. At home in addition to the patient are her husband [name], originally from [place], who works as a Administrator.  The other children are [name], who lives with the patient and has a college degree in psychology; she works as a Probation officer; no longer living in the home is her daughter [name] (she is studying to be a Marine scientist) and grandson.son [name], lives in [place] and works as a Higher education careers adviser. [name] also sometimes stays in the home. In addition the patient has an aide who helps her almost daily.    ADVANCED DIRECTIVES: The patient has completed advanced directives and name her daughter [name] as her healthcare power of attorney.  The patient also completed a  living will.  This is notarized.  These documents are being sent for scanning on epic on 01/15/2020   HEALTH MAINTENANCE: Social History   Tobacco Use  . Smoking status: Former Research scientist (life sciences)  . Smokeless tobacco: Never Used  Vaping Use  . Vaping Use: Never used  Substance Use Topics  . Alcohol use: Never    Comment: Rarely  . Drug use: No     Colonoscopy:  Not on file  PAP:  Not on file  Bone density:  Not on file  Lipid panel:  Not on file   Allergies  Allergen Reactions  . Tramadol Nausea Only  . Hydrocodone-Acetaminophen Itching and Rash  . Pork-Derived Products Other (See Comments)    Pt says she just doesn't eat pork; has no reaction to pork products    Current Outpatient Medications  Medication Sig Dispense Refill  . acetaminophen (TYLENOL) 325 MG tablet Take 2 tablets (650 mg total) by mouth every 6 (six) hours as needed for mild pain (or Fever >/= 101).    Marland Kitchen allopurinol (ZYLOPRIM) 300 MG tablet Take 300 mg by mouth daily.     Marland Kitchen  cephALEXin (KEFLEX) 500 MG capsule Take 1 capsule (500 mg total) by mouth 3 (three) times daily for 7 days. 21 capsule 0  . dexamethasone (DECADRON) 4 MG tablet Take 1 tablet (4 mg total) by mouth daily. 45 tablet 0  . furosemide (LASIX) 20 MG tablet Take 1 tablet (20 mg total) by mouth daily. 7 tablet 0  . iron polysaccharides (NIFEREX) 150 MG capsule Take 1 capsule (150 mg total) by mouth daily. 30 capsule 0  . levETIRAcetam (KEPPRA) 750 MG tablet TAKE 1 TABLET BY MOUTH 2 TIMES DAILY. 60 tablet 1  . Multiple Vitamin (MULTIVITAMIN) capsule Take 1 capsule by mouth daily.      . ondansetron (ZOFRAN) 4 MG tablet Take 1 tablet (4 mg total) by mouth 3 (three) times daily as needed. 20 tablet 3  . Potassium Chloride ER 20 MEQ TBCR Take 1 tablet by mouth daily. 60 tablet 2  . prochlorperazine (COMPAZINE) 10 MG tablet Take 1 tablet (10 mg total) by mouth every 6 (six) hours as needed for nausea or vomiting. 30 tablet 1  . tucatinib (TUKYSA) 50 MG tablet  Take 3 tablets (150 mg total) by mouth in the morning. Take as directed by MD. 60 tablet 3  . ALPRAZolam (XANAX) 1 MG tablet Take 1 mg by mouth daily as needed for anxiety. (Patient not taking: Reported on 09/18/2020)    . methadone (DOLOPHINE) 10 MG tablet Take 10 mg by mouth in the morning, at noon, and at bedtime.  (Patient not taking: Reported on 09/18/2020)    . mupirocin ointment (BACTROBAN) 2 % Place 1 application into the nose 2 (two) times daily. (Patient not taking: Reported on 06/12/2020) 22 g 0  . NARCAN 4 MG/0.1ML LIQD nasal spray kit 1 spray as directed. (Patient not taking: Reported on 09/18/2020)    . omeprazole (PRILOSEC) 40 MG capsule Take 1 capsule (40 mg total) by mouth daily. (Patient not taking: Reported on 06/12/2020) 30 capsule 1  . traZODone (DESYREL) 50 MG tablet Take 25-50 mg by mouth at bedtime. (Patient not taking: Reported on 09/18/2020)     No current facility-administered medications for this visit.   Facility-Administered Medications Ordered in Other Visits  Medication Dose Route Frequency Provider Last Rate Last Admin  . sodium chloride 0.9 % injection 10 mL  10 mL Intravenous PRN [name], [name] Dad, MD   10 mL at 03/04/14 1421  . sodium chloride flush (NS) 0.9 % injection 10 mL  10 mL Intracatheter PRN [name], [name] Dad, MD   10 mL at 07/30/19 1627    OBJECTIVE: African-American woman in no acute distress  Vitals:   09/18/20 0840  BP: 124/63  Pulse: 73  Resp: 18  Temp: (!) 97.2 F (36.2 C)  SpO2: 97%   Wt Readings from Last 3 Encounters:  09/18/20 179 lb 6.4 oz (81.4 kg)  08/20/20 162 lb 3.2 oz (73.6 kg)  08/16/20 173 lb 11.2 oz (78.8 kg)   Body mass index is 30.79 kg/m.    GENERAL: Patient is a well appearing female in no acute distress HEENT:  Sclerae anicteric.  Mask in place. Neck is supple.  NODES:  No cervical, supraclavicular, or axillary lymphadenopathy palpated.  BREAST EXAM:  S/p mastectomy LUNGS:  Clear to auscultation bilaterally.  No  wheezes or rhonchi. HEART:  Regular rate and rhythm. No murmur appreciated. ABDOMEN:  Soft, nontender.  Positive, normoactive bowel sounds. No organomegaly palpated. MSK:  No focal spinal tenderness to palpation. Full range of motion bilaterally  in the upper extremities. EXTREMITIES:  No peripheral edema.   SKIN:  Metastatic lesions examined by myself and Dr. Jana Hakim.  Stable. No nail dyscrasia. NEURO:  Nonfocal. Well oriented.  Appropriate affect.    LAB RESULTS:.  Lab Results  Component Value Date   WBC 10.1 09/18/2020   NEUTROABS 6.4 09/18/2020   HGB 10.8 (L) 09/18/2020   HCT 33.2 (L) 09/18/2020   MCV 86.0 09/18/2020   PLT 351 09/18/2020      Chemistry      Component Value Date/Time   NA 142 09/18/2020 0814   NA 142 11/09/2017 1214   K 3.5 09/18/2020 0814   K 3.9 11/09/2017 1214   CL 105 09/18/2020 0814   CL 101 03/07/2013 1411   CO2 28 09/18/2020 0814   CO2 32 (H) 11/09/2017 1214   BUN 10 09/18/2020 0814   BUN 19.1 11/09/2017 1214   CREATININE 0.86 09/18/2020 0814   CREATININE 1.0 11/09/2017 1214      Component Value Date/Time   CALCIUM 9.1 09/18/2020 0814   CALCIUM 9.1 11/09/2017 1214   ALKPHOS 87 09/18/2020 0814   ALKPHOS 120 11/09/2017 1214   AST 21 09/18/2020 0814   AST 30 11/09/2017 1214   ALT 10 09/18/2020 0814   ALT 19 11/09/2017 1214   BILITOT 0.4 09/18/2020 0814   BILITOT <0.22 11/09/2017 1214       STUDIES: VAS [name] LOWER EXTREMITY VENOUS (DVT) (ONLY [facility] & [facility] 7a-7p)  Result Date: 09/17/2020  Lower Venous DVT Study Indications: Edema.  Risk Factors: None identified. Limitations: Body habitus and poor ultrasound/tissue interface. Comparison Study: No prior studies. Performing Technologist: Oliver Hum RVT  Examination Guidelines: A complete evaluation includes B-mode imaging, spectral Doppler, color Doppler, and power Doppler as needed of all accessible portions of each vessel. Bilateral testing is considered an integral part of a complete  examination. Limited examinations for reoccurring indications may be performed as noted. The reflux portion of the exam is performed with the patient in reverse Trendelenburg.  +---------+---------------+---------+-----------+----------+--------------+ RIGHT    CompressibilityPhasicitySpontaneityPropertiesThrombus Aging +---------+---------------+---------+-----------+----------+--------------+ CFV      Full           Yes      Yes                                 +---------+---------------+---------+-----------+----------+--------------+ SFJ      Full                                                        +---------+---------------+---------+-----------+----------+--------------+ FV Prox  Full                                                        +---------+---------------+---------+-----------+----------+--------------+ FV Mid                  Yes      Yes                                 +---------+---------------+---------+-----------+----------+--------------+ FV Distal  Yes      Yes                                 +---------+---------------+---------+-----------+----------+--------------+ PFV      Full                                                        +---------+---------------+---------+-----------+----------+--------------+ POP      Full           Yes      Yes                                 +---------+---------------+---------+-----------+----------+--------------+ PTV      Full                                                        +---------+---------------+---------+-----------+----------+--------------+ PERO     Full                                                        +---------+---------------+---------+-----------+----------+--------------+ Soleal   Full                                                        +---------+---------------+---------+-----------+----------+--------------+    +----+---------------+---------+-----------+----------+--------------+ LEFTCompressibilityPhasicitySpontaneityPropertiesThrombus Aging +----+---------------+---------+-----------+----------+--------------+ CFV Full           Yes      Yes                                 +----+---------------+---------+-----------+----------+--------------+     Summary: RIGHT: - There is no evidence of deep vein thrombosis in the lower extremity. However, portions of this examination were limited- see technologist comments above.  - No cystic structure found in the popliteal fossa.  LEFT: - No evidence of common femoral vein obstruction.  *See table(s) above for measurements and observations. Electronically signed by Harold Barban MD on 09/17/2020 at 7:32:36 AM.    Final      ASSESSMENT:  64 y.o. [name] woman with stage IV breast cancer   (1) Status post right upper inner quadrant lumpectomy and sentinel lymph node sampling 03/04/2002 for 2 separate foci of invasive ductal carcinoma, mpT2 pN1 or stage IIB, both foci grade 3, both estrogen and progesterone receptor positive, both HercepTest 3+, Mib-1 56%  (2) Reexcision for margins 05/27/2002 showed no residual cancer in the breast.  (3) The patient refused adjuvant systemic therapy.  (4) Adjuvant radiation treatment completed 08/14/2002.  (5) recurrence in the right breast in 02/2004 showing a morphologically different tumor, again grade 3, again estrogen and progesterone receptor negative, with an MIB-1 of 14% and Herceptest 3+.  (5) Between 03/2004  and 07/2004 she received dose dense Doxorubicin/Cyclophosphamide x 4 given with trastuzumab, followed by weekly Docetaxel x 8, again given with trastuzumab.  (6) Right mastectomy 07/13/2004 showed scattered microscopic foci of residual disease over an area  greater than 5 cm. Margins were negative.  (7) Postoperative Docetaxel continued until 09/2004.  (8) Trastuzumab (Herceptin) given 08/2004  through 01/2012 with some brief interruptions.  RECURRENT/ STAGE IV DISEASE December 2006 (9) Isolated right cervical nodal recurrence 10/2005, treated with radiation to the right supraclavicular area (total 41.5 gray) completed 12/29/2005.  (10) Navelbine given together with Herceptin  11/2005 through 03/2006.  (9) Left mastectomy 02/13/2006 for ductal carcinoma in situ, grade 2, estrogen and progesterone receptor negative, with negative margins; 0 of 3 lymph nodes involved  (10) treated with Lapatinib and Capecitabine before 10/2009, for an unclear duration and with unclear results (cannot locate data on chart review).  (11) Status post right supraclavicular lymph node biopsy 09/2010 again positive for an invasive ductal carcinoma, estrogen and progesterone receptor negative, HER-2 positive by CISH with a ratio 4.25.  (12) Navelbine given together with Herceptin between 05/2011 and 11/2011.  (13) Carboplatin/ Gemcitabine/ Herceptin given for 2 cycles, in 12/2011 and 01/2012.  (14) TDM-1 ([place]) started 02/2012. Last dose 10/02/2013 after which the patient discontinued treatment at her own discretion. Echo on December 2014 showed a well preserved ejection fraction.  (15) Deep vein thrombosis of the right upper extremity documented 04/20/2011.  She completed anticoagulant therapy with Coumadin on 03/25/2013.  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 Left chest Port-A-Cath placed on 04/08/2013; being flushed every 6 weeks  RIGHT UPPER EXTREMITY ANGIOSARCOMA VS BREAST CANCER: August 2016 (18) treated at [facility] [place] August 2016 with paclitaxel, trastuzumab and pertuzumab 1 cycle, afterwards referred to hospice  (19) under the care of  [facility] August 2016 to 05/08/2016, when the patient opted for a second try at chemotherapy  (20) started low-dose Abraxane, trastuzumab and pertuzumab 06/07/2016, to be repeated every 4 weeks.   (a) pretreatment echocardiogram 06/06/2016 showed a 60-65% ejection fraction  (b) significant compliance problems compromise response to treatment  (c)  echocardiogram 02/20/17 LVEF 55-60%  (d) Abraxane discontinued 07/04/2017 because of possible neuropathy symptoms  (e) Abraxane resumed 09/27/2017  (f) echocardiogram 07/20/2017 showed an ejection fraction of 60-65%  (g) echo 01/22/2018 showed an ejection fraction of 50-55%  (h) CT scan of the chest 05/03/2018 shows no disease involving the lungs liver or lymph nodes, with stable subcutaneous masses as previously noted  (I) Abraxane discontinued August 2019 because of concerns regarding neuropathy-- gemcitabine substituted  (j) echocardiogram 08/20/2018 shows an ejection fraction in the 55-60% range (done every 6 months)  (k) gemcitabine discontinued because of multiple symptoms, Abraxane restarted 09/11/2018, given once every 4 weeks  (l) echocardiogram 07/17/2019 shows a well-preserved ejection fraction at 55-60%  (m) restaging PET scan 08/20/2019 shows hypermetabolic adenopathy, subcutaneous malignancy as clinically appreciated, but no liver or lung parenchymal lesions   (21) Chronic pain secondary to known metastatic disease  (a) patient signed pain contract 10/19/2016  (b) as of 11/16/2016 receives her pain medications from Dr [name]  (c)  Dr [name] no longer able to prescribe as of February 2019  (d) all pain medications now through [facility], Dr Mirna Mires (as  of April 2019)  (22) left breast and left axillary adenopathy noted clinically  (a) left axillary lymph node biopsy on 07/24/2019 benign  (23) BRAIN METASTASES: frontal and temporal lobe metastases noted on brain MRI 09/04/2019  (a) adjuvant radiation  09/11/2019-10/16/2019:    The brain was treated to 30 Gy in 10 fractions.   (b) tucatinib prescribed December 2020 at 150 mg daily because of methadone interaction, finally started March 2021 but taken irregularly by the patient.  (c) noncontrast head CT 01/12/2020 stable lesions/improved edema  (d) brain MRI 02/01/2020 shows decreased size of the right frontal and temporal lobe metastases with mild residual edema, no mass-effect, and no evidence of new mets  (e) noncontrast MRI of the cervical, thoracic, and lumbar spine 02/11/2020 and 02/12/2020 showed no metastatic disease  (24) COVID-19 infection 2019-11-19   PLAN: [name] is here today for f/u of her metastatic breast cancer.  She is doing well from the perspective of the tumors on her back.  She has no peripheral neuropathy and is tolerating treatment well.  Her most recent echo was in February.  She has no heart failure concerns and she will undergo echo on 11/24.  She will proceed with treatment today per Dr. Jana Hakim.    [name] and [name] are confused about her medicaitons.  I have asked our social worker to get together her daughters and someone from her PCP office so that we can collaborate and all be on the same page.  Today prior to treatment our nurse will give her one percocet.    Aireanna will undergo brain MRI on 11/16.  She needs f/u with Dr. Mickeal [name] after and I will message him so that we can get this arranged.    [name] will return in 4 weeks for labs, f/u, and her next treatment.  She knows to call for any questions that may arise between now and her next appointment.  We are happy to see her sooner if needed.   Total encounter time 35 minutes.Wilber Bihari, NP 09/18/20 9:21 AM Medical Oncology and Hematology [facility] [place], [place] [address] Tel. [phone]    Fax. [phone]   ADDENDUM: Ziah situation has never been easy but it is now exceedingly complex.  It is  difficult to know what she is actually taking at home.  At one time one of her daughters was putting her medicines in the medicine box for her but that apparently is not happening anymore.  It would be very useful if the patient's daughter could come with her at the next visit, bring all her home medicines, and let us find out what she is on on what she is actually not on.  In the meantime of course we are concerned that there may be progression in the brain.  This is going to be evaluated in the near future with neuro oncology follow-up.  I personally saw this patient and performed a substantive portion of this encounter with the listed APP documented above.   Chauncey Cruel, MD Medical Oncology and Hematology [facility] [address] [place], [place] [address] Tel. [phone]    Fax. [phone]     *Total Encounter Time as defined by the Centers for Medicare and Medicaid Services includes, in addition to the face-to-face time of a patient visit (documented in the note above) non-face-to-face time: obtaining and reviewing outside history, ordering and reviewing medications, tests or procedures, care coordination (communications with other  health care professionals or caregivers) and documentation in the medical record.

## 2020-09-18 NOTE — Telephone Encounter (Signed)
This LPN called pt's daughter, [name] to make her aware of appt for MRI-brain 09/22/20 at 0730. This LPN stressed to daughter the importance of pt showing for this appt. Pt is also aware and verbalizes agreement.

## 2020-09-18 NOTE — Patient Instructions (Signed)

## 2020-09-21 ENCOUNTER — Telehealth: Payer: Self-pay

## 2020-09-21 ENCOUNTER — Telehealth: Payer: Self-pay | Admitting: Adult Health

## 2020-09-21 NOTE — Telephone Encounter (Signed)
(  10:66) Palliative care social work consult with daughter to discuss patient status, caregiver coping and education regarding hospice services. SW also provided education regarding assisted living. Although daughter feels her mother may benefit from hospice, patient desires to continue receiving chemo. SW provided supportive counseling, validation of feelings, active and reflective counseling and strongly encouraged daughter to call with any questions, concerns or needed support.

## 2020-09-21 NOTE — Telephone Encounter (Signed)
Scheduled appts per 11/12 los. Left voicemail with appt date and time.

## 2020-09-22 ENCOUNTER — Other Ambulatory Visit: Payer: Self-pay | Admitting: Oncology

## 2020-09-22 ENCOUNTER — Ambulatory Visit (HOSPITAL_COMMUNITY)
Admission: RE | Admit: 2020-09-22 | Discharge: 2020-09-22 | Disposition: A | Discharge status: Home / Self Care | Payer: Medicare Other | Source: Ambulatory Visit | Attending: Oncology | Admitting: Oncology

## 2020-09-22 ENCOUNTER — Other Ambulatory Visit: Payer: Self-pay

## 2020-09-22 DIAGNOSIS — C7931 Secondary malignant neoplasm of brain: Secondary | ICD-10-CM | POA: Insufficient documentation

## 2020-09-22 DIAGNOSIS — C50911 Malignant neoplasm of unspecified site of right female breast: Secondary | ICD-10-CM | POA: Diagnosis present

## 2020-09-22 MED ORDER — GADOBUTROL 1 MMOL/ML IV SOLN
8.0000 mL | Freq: Once | INTRAVENOUS | Status: AC | PRN
  Administered 2020-09-22: 8 mL via INTRAVENOUS

## 2020-09-22 NOTE — Progress Notes (Signed)
Was called by radiology with the results of [name] brain MRI which apparently do show progression although we could be seeing radiation necrosis.  She already has an appointment with Dr. [name] next week.  I sent him a note just to make sure we do not need to start her on dexamethasone or consider bevacizumab before that visit.

## 2020-09-23 MED FILL — TRIAMTERENE-HCTZ 37.5-25 MG: 37.5-25 | 30 days supply | Qty: 30 | Fill #0

## 2020-09-23 MED FILL — POTASSIUM CL ER 20 MEQ TAB: 20 | 30 days supply | Qty: 30 | Fill #0

## 2020-09-23 MED FILL — TUKYSA 150 MG TABS: 150 | 30 days supply | Qty: 30 | Fill #0

## 2020-09-28 ENCOUNTER — Inpatient Hospital Stay: Payer: Medicare Other | Admitting: Internal Medicine

## 2020-09-29 ENCOUNTER — Other Ambulatory Visit: Payer: Self-pay | Admitting: Oncology

## 2020-09-29 MED ORDER — DEXAMETHASONE 4 MG PO TABS
4.0000 mg | ORAL_TABLET | Freq: Two times a day (BID) | ORAL | 4 refills | Status: DC
Start: 2020-09-29 — End: 2020-10-05

## 2020-09-29 NOTE — Progress Notes (Signed)
Tanisa did not show for her appointment with Dr. [name] today. He thinks the scan may represent radiation necrosis or it could be organic tumor progression. She is supposedly on a Decadron taper but I have called in 4 mg twice daily. I left a message on her cell phone. I called the home phone but there was no answer and there is no way to leave a voicemail there.  I called the patient's daughter [name] and left a message for her to asked her mother to call us tomorrow.

## 2020-09-30 ENCOUNTER — Ambulatory Visit (HOSPITAL_COMMUNITY): Payer: Medicare Other | Attending: Cardiology

## 2020-09-30 ENCOUNTER — Telehealth: Payer: Self-pay | Admitting: *Deleted

## 2020-09-30 ENCOUNTER — Other Ambulatory Visit: Payer: Self-pay

## 2020-09-30 ENCOUNTER — Other Ambulatory Visit: Payer: Medicare Other | Admitting: Hospice

## 2020-09-30 ENCOUNTER — Telehealth: Payer: Self-pay | Admitting: Radiation Therapy

## 2020-09-30 DIAGNOSIS — Z0189 Encounter for other specified special examinations: Secondary | ICD-10-CM | POA: Diagnosis not present

## 2020-09-30 DIAGNOSIS — Z170 Estrogen receptor positive status [ER+]: Secondary | ICD-10-CM | POA: Diagnosis not present

## 2020-09-30 DIAGNOSIS — C50211 Malignant neoplasm of upper-inner quadrant of right female breast: Secondary | ICD-10-CM

## 2020-09-30 DIAGNOSIS — I10 Essential (primary) hypertension: Secondary | ICD-10-CM | POA: Diagnosis not present

## 2020-09-30 DIAGNOSIS — I351 Nonrheumatic aortic (valve) insufficiency: Secondary | ICD-10-CM | POA: Diagnosis not present

## 2020-09-30 DIAGNOSIS — Z01818 Encounter for other preprocedural examination: Secondary | ICD-10-CM | POA: Diagnosis not present

## 2020-09-30 DIAGNOSIS — Z87891 Personal history of nicotine dependence: Secondary | ICD-10-CM | POA: Insufficient documentation

## 2020-09-30 DIAGNOSIS — C7931 Secondary malignant neoplasm of brain: Secondary | ICD-10-CM | POA: Diagnosis not present

## 2020-09-30 DIAGNOSIS — C4911 Malignant neoplasm of connective and soft tissue of right upper limb, including shoulder: Secondary | ICD-10-CM | POA: Diagnosis not present

## 2020-09-30 DIAGNOSIS — C50911 Malignant neoplasm of unspecified site of right female breast: Secondary | ICD-10-CM | POA: Diagnosis not present

## 2020-09-30 DIAGNOSIS — Z515 Encounter for palliative care: Secondary | ICD-10-CM

## 2020-09-30 NOTE — Telephone Encounter (Signed)
This RN spoke with [name] per her call - informed her of concern due to missed appt yesterday with Dr [name] to follow up on recent MRI of her brain ( she thought it was next Monday ) and new growth in brain mets.  This RN discussed above with 3 repeat backs from [place] verifying concern of growth of cancer, need to start on steroids and need to see Dr [name].  This RN verified pt's pharmacy that decadron was sent to.  This RN gave verbal support to pt regarding above including goal of care of this office is to help her.

## 2020-09-30 NOTE — Progress Notes (Signed)
[facility] Consult Note Telephone: [phone]  Fax: [phone]  PATIENT NAME: [name] DOB: 13-Jul-1956 MRN: [id number] PRIMARY CARE PROVIDER:   Libby Maw, MD Libby Maw, MD [place],  [place] [address]  REFERRING PROVIDER:[name], Sarajane Jews, MD  RESPONSIBLE PARTY:Self Contacts: [id number] [phone] POA [name] [phone]   TELEHEALTH VISIT STATEMENT Due to the COVID-19 crisis, this visit was done via telephone from my office. It was initiated and consented to by this patient and/or family.   RECOMMENDATIONS/PLAN:  Telehealth visit at 1pm to build trust and follow-up on report of patient overmedicating and getting high.  [name] reported patient has taken a turn for good since she did not want to go to rehab; no longer overmedicating and getting high.  Patient was alert and coherent during visit.  Visit consisted of counseling and education dealing with the complex and emotionally intense issues of symptom management and palliative care in the setting of serious and potentially life-threatening illness.  NP had requested [place] social worker to reach out to patient/family.  Epic review shows [employer] SW reached out to [place] providing supportive counseling, validation of feelings, active and reflective counseling.  Palliative care team will continue to provide support to patient, patient's family, and medical team. Patient/ [name]/[name] know to call with worsening condition/concerns.    NP emphasized the need for patient to keep her appointment with her physician psychiatrist Dr. [name].  Daleen Snook said the appointment is for Monday, 10/05/2020.  NP also encouraged adherence to chemo and all treatment plans.  Patient verbalized understanding and expressed commitment. Patient in no medical acuity at this time.   Advance Care Planning/Code Status: Patient is a DO NOT  RESUSCITATE  Goals of Care: Goals of care include to maximize quality of life and symptom management.  Follow up: Palliative care will continue to follow patient for goals of care clarification and symptom management. F/u in a month/as needed  Family /Caregiver/Community Supports:Patient lives at home with spouse who is mostly gone on truck driving, and with her 2 daughters - [name] and [name] and her grandson.  Strong family support system identified. I spent 46 minutes providing this consultation; time includes time spent with patient/family, chart review, provider coordination,  and documentation. More than 50% of the time in this consultation was spent on counseling and coordinating communication  HISTORY OF PRESENT ILLNESS:[name] Foxis a 63 y.o.year oldfemalewith multiple medical problems includingStage IV estrogen receptor negative Breast Cancer, with brain mets, angiosarcoma, Gout.Palliative Care was asked to help address goals of care.    CODE STATUS: DNR   PPS: 50%  HOSPICE ELIGIBILITY/DIAGNOSIS: TBD  PAST MEDICAL HISTORY:  Past Medical History:  Diagnosis Date  . Breast cancer ([facility])   . DVT (deep venous thrombosis) ([facility]) 10/07/2011  . Fever 02/06/2018  . Hypertension   . Radiation 11/29/2005-12/29/2005   right supraclavicular area 4147 cGy  . Radiation 06/26/02-08/14/02   right breast 5040 cGy, tumor bed boosted to 1260 cGy  . Rheumatic fever     SOCIAL HX:  Social History   Tobacco Use  . Smoking status: Former Research scientist (life sciences)  . Smokeless tobacco: Never Used  Substance Use Topics  . Alcohol use: Never    Comment: Rarely    ALLERGIES:  Allergies  Allergen Reactions  . Tramadol Nausea Only  . Hydrocodone-Acetaminophen Itching and Rash  . Pork-Derived Products Other (See Comments)    Pt says she just doesn't eat pork; has  no reaction to pork products     PERTINENT MEDICATIONS:  Outpatient Encounter Medications as of 09/30/2020  Medication Sig  .  acetaminophen (TYLENOL) 325 MG tablet Take 2 tablets (650 mg total) by mouth every 6 (six) hours as needed for mild pain (or Fever >/= 101).  Marland Kitchen allopurinol (ZYLOPRIM) 300 MG tablet Take 300 mg by mouth daily.   Marland Kitchen ALPRAZolam (XANAX) 1 MG tablet Take 1 mg by mouth daily as needed for anxiety. (Patient not taking: Reported on 09/18/2020)  . dexamethasone (DECADRON) 4 MG tablet Take 1 tablet (4 mg total) by mouth 2 (two) times daily.  . furosemide (LASIX) 20 MG tablet Take 1 tablet (20 mg total) by mouth daily.  . iron polysaccharides (NIFEREX) 150 MG capsule Take 1 capsule (150 mg total) by mouth daily.  Marland Kitchen levETIRAcetam (KEPPRA) 750 MG tablet TAKE 1 TABLET BY MOUTH 2 TIMES DAILY.  . methadone (DOLOPHINE) 10 MG tablet Take 10 mg by mouth in the morning, at noon, and at bedtime.  (Patient not taking: Reported on 09/18/2020)  . Multiple Vitamin (MULTIVITAMIN) capsule Take 1 capsule by mouth daily.    . mupirocin ointment (BACTROBAN) 2 % Place 1 application into the nose 2 (two) times daily. (Patient not taking: Reported on 06/12/2020)  . NARCAN 4 MG/0.1ML LIQD nasal spray kit 1 spray as directed. (Patient not taking: Reported on 09/18/2020)  . omeprazole (PRILOSEC) 40 MG capsule Take 1 capsule (40 mg total) by mouth daily. (Patient not taking: Reported on 06/12/2020)  . ondansetron (ZOFRAN) 4 MG tablet Take 1 tablet (4 mg total) by mouth 3 (three) times daily as needed.  . Potassium Chloride ER 20 MEQ TBCR Take 1 tablet by mouth daily.  . prochlorperazine (COMPAZINE) 10 MG tablet Take 1 tablet (10 mg total) by mouth every 6 (six) hours as needed for nausea or vomiting.  . traZODone (DESYREL) 50 MG tablet Take 25-50 mg by mouth at bedtime. (Patient not taking: Reported on 09/18/2020)  . tucatinib (TUKYSA) 50 MG tablet Take 3 tablets (150 mg total) by mouth in the morning. Take as directed by MD.   Facility-Administered Encounter Medications as of 09/30/2020  Medication  . sodium chloride 0.9 % injection 10  mL  . sodium chloride flush (NS) 0.9 % injection 10 mL    Teodoro Spray, NP

## 2020-09-30 NOTE — Telephone Encounter (Signed)
Left a voicemail for [name] about her rescheduled visit with Dr. [name] on Monday 11/29.   [name]

## 2020-10-01 LAB — ECHOCARDIOGRAM COMPLETE
Area-P 1/2: 2.42 cm2
MV M vel: 4.96 m/s
MV Peak grad: 98.4 mmHg
P 1/2 time: 434 msec
S' Lateral: 3.25 cm

## 2020-10-05 ENCOUNTER — Other Ambulatory Visit: Payer: Self-pay

## 2020-10-05 ENCOUNTER — Inpatient Hospital Stay (HOSPITAL_BASED_OUTPATIENT_CLINIC_OR_DEPARTMENT_OTHER): Payer: Medicare Other | Admitting: Internal Medicine

## 2020-10-05 ENCOUNTER — Other Ambulatory Visit: Payer: Self-pay | Admitting: Oncology

## 2020-10-05 VITALS — BP 125/74 | HR 67 | Temp 97.8°F | Resp 18 | Ht 64.0 in | Wt 170.1 lb

## 2020-10-05 DIAGNOSIS — Z5112 Encounter for antineoplastic immunotherapy: Secondary | ICD-10-CM | POA: Diagnosis not present

## 2020-10-05 DIAGNOSIS — C7931 Secondary malignant neoplasm of brain: Secondary | ICD-10-CM | POA: Diagnosis not present

## 2020-10-05 MED ORDER — DEXAMETHASONE 4 MG PO TABS
4.0000 mg | ORAL_TABLET | Freq: Every day | ORAL | 1 refills | Status: DC
Start: 2020-10-05 — End: 2020-10-07

## 2020-10-05 NOTE — Progress Notes (Signed)
[facility] [address], [place] [address] [phone]   New Patient Evaluation  Date of Service: 10/05/20 Patient Name: [name] Patient MRN: [id number] Patient DOB: 1955-12-22 Provider: Ventura Sellers, MD  Identifying Statement:  [name] is a 64 y.o. female with Metastasis to brain [facility]) [C79.31] who presents for initial consultation and evaluation regarding cancer associated neurologic deficits.    Referring Provider: Chauncey Cruel, MD [address] [place],  [place] [address]  Primary Cancer:  Oncologic History: Oncology History  Malignant neoplasm of upper-inner quadrant of right breast in female, estrogen receptor positive ([place])  10/03/2013 Initial Diagnosis   Malignant neoplasm of upper-inner quadrant of right breast in female, estrogen receptor positive ([place])   06/07/2016 -  Chemotherapy   The patient had trastuzumab (HERCEPTIN) 651 mg in sodium chloride 0.9 % 250 mL chemo infusion, 8 mg/kg = 651 mg, Intravenous,  Once, 35 of 35 cycles Dose modification: 440 mg (original dose 6 mg/kg, Cycle 7, Reason: Provider Judgment), 4 mg/kg (original dose 6 mg/kg, Cycle 10, Reason: Provider Judgment), 6 mg/kg (original dose 6 mg/kg, Cycle 17, Reason: Other (see comments), Comment: MD confirmed dose), 4 mg/kg (original dose 6 mg/kg, Cycle 21, Reason: Provider Judgment), 6 mg/kg (original dose 6 mg/kg, Cycle 26, Reason: Other (see comments), Comment: reentered for scanning issues), 6 mg/kg (original dose 6 mg/kg, Cycle 27, Reason: Other (see comments), Comment: reentered to use current herceptin vial of 171m) Administration: 651 mg (06/07/2016), 483 mg (06/28/2016), 483 mg (01/03/2017), 483 mg (08/30/2016), 483 mg (10/11/2016), 440 mg (02/23/2017), 450 mg (03/16/2017), 450 mg (04/06/2017), 450 mg (04/27/2017), 300 mg (05/30/2017), 450 mg (07/04/2017), 450 mg (08/15/2017), 450 mg (09/05/2017), 450 mg (09/27/2017), 525 mg (11/29/2017),  525 mg (12/27/2017), 525 mg (01/24/2018), 525 mg (03/21/2018), 525 mg (04/18/2018), 336 mg (05/24/2018), 525 mg (06/13/2018), 525 mg (07/14/2018), 525 mg (08/14/2018), 525 mg (09/11/2018), 525 mg (10/23/2018), 525 mg (11/21/2018), 525 mg (12/19/2018), 525 mg (01/16/2019), 525 mg (02/13/2019), 525 mg (03/12/2019), 525 mg (04/09/2019), 525 mg (05/07/2019), 525 mg (06/04/2019), 525 mg (07/02/2019) pertuzumab (PERJETA) 840 mg in sodium chloride 0.9 % 250 mL chemo infusion, 840 mg, Intravenous, Once, 38 of 40 cycles Dose modification: 420 mg (original dose 420 mg, Cycle 8), 420 mg (original dose 420 mg, Cycle 14), 420 mg (original dose 420 mg, Cycle 18) Administration: 840 mg (06/07/2016), 420 mg (06/28/2016), 420 mg (04/06/2017), 420 mg (04/27/2017), 420 mg (07/04/2017), 420 mg (09/27/2017), 420 mg (11/09/2017), 420 mg (11/29/2017), 420 mg (12/27/2017), 420 mg (03/21/2018), 420 mg (04/18/2018), 420 mg (06/13/2018), 420 mg (07/14/2018), 420 mg (08/14/2018), 420 mg (09/11/2018), 420 mg (01/24/2018), 420 mg (10/23/2018), 420 mg (11/21/2018), 420 mg (12/19/2018), 420 mg (01/16/2019), 420 mg (02/13/2019), 420 mg (03/12/2019), 420 mg (04/09/2019), 420 mg (05/07/2019), 420 mg (06/04/2019), 420 mg (07/02/2019), 420 mg (07/30/2019), 420 mg (10/18/2019), 420 mg (12/18/2019), 420 mg (01/15/2020), 420 mg (02/21/2020), 420 mg (04/01/2020), 420 mg (04/29/2020), 420 mg (05/27/2020), 420 mg (07/24/2020), 420 mg (08/20/2020), 420 mg (09/18/2020) trastuzumab-anns (KANJINTI) 525 mg in sodium chloride 0.9 % 250 mL chemo infusion, 6 mg/kg = 525 mg (100 % of original dose 6 mg/kg), Intravenous,  Once, 11 of 13 cycles Dose modification: 6 mg/kg (original dose 6 mg/kg, Cycle 36, Reason: Other (see comments)), 450 mg (original dose 6 mg/kg, Cycle 41, Reason: Provider Judgment, Comment: flat dose per MD) Administration: 525 mg (07/30/2019), 525 mg (10/18/2019), 525 mg (12/18/2019), 525 mg (01/15/2020), 600 mg (02/21/2020), 450 mg (  04/01/2020), 450 mg (04/29/2020), 450 mg (05/27/2020), 450 mg (07/24/2020),  450 mg (08/20/2020), 450 mg (09/18/2020)  for chemotherapy treatment.    09/11/2018 -  Chemotherapy   The patient had PACLitaxel-protein bound (ABRAXANE) chemo infusion 200 mg, 100 mg/m2 = 200 mg (100 % of original dose 100 mg/m2), Intravenous,  Once, 15 of 18 cycles Dose modification: 100 mg/m2 (original dose 100 mg/m2, Cycle 7) Administration: 200 mg (04/09/2019), 200 mg (05/07/2019), 200 mg (06/04/2019), 200 mg (07/02/2019), 200 mg (07/30/2019), 200 mg (10/18/2019), 200 mg (12/18/2019), 200 mg (01/15/2020), 200 mg (02/21/2020), 200 mg (04/01/2020), 200 mg (04/29/2020), 200 mg (05/27/2020), 200 mg (07/24/2020), 200 mg (08/20/2020), 200 mg (09/18/2020) PACLitaxel-protein bound (ABRAXANE) chemo infusion 200 mg, 100 mg/m2 = 200 mg, Intravenous, Once, 6 of 6 cycles Administration: 200 mg (10/23/2018), 200 mg (11/21/2018), 200 mg (12/19/2018), 200 mg (01/16/2019), 200 mg (02/13/2019), 200 mg (03/12/2019)  for chemotherapy treatment.    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[place])   09/11/2018 -  Chemotherapy   The patient had PACLitaxel-protein bound (ABRAXANE) chemo infusion 200 mg, 100 mg/m2 = 200 mg (100 % of original dose 100 mg/m2), Intravenous,  Once, 13 of 18 cycles Dose modification: 100 mg/m2 (original dose 100 mg/m2, Cycle 7) Administration: 200 mg (04/09/2019), 200 mg (05/07/2019), 200 mg (06/04/2019), 200 mg (07/02/2019), 200 mg (07/30/2019), 200 mg (10/18/2019), 200 mg (12/18/2019), 200 mg (01/15/2020), 200 mg (02/21/2020), 200 mg (04/01/2020), 200 mg (04/29/2020), 200 mg (05/27/2020), 200 mg (07/24/2020) PACLitaxel-protein bound (ABRAXANE) chemo infusion 200 mg, 100 mg/m2 = 200 mg, Intravenous, Once, 6 of 6 cycles Administration: 200 mg (10/23/2018), 200 mg (11/21/2018), 200 mg (12/19/2018), 200 mg (01/16/2019), 200 mg (02/13/2019), 200 mg (03/12/2019)  for chemotherapy treatment.     CNS Oncologic History 10/16/19: Completes WBRT  Lisbeth Renshaw)   History of Present Illness: The patient's records from the referring physician were obtained and reviewed and the patient interviewed to confirm this HPI.  [name] presents to clinic today to review recent CNS findings.  [name] describes no gait impairment, no weakness, numbness, or speech difficulty.  No headaches or seizures.  [name] does complain of chronic pain affecting her extremities.  [name] ambulates without assistance, mostly takes care of own needs.  Was started on decadron 56m twice per day after recent findings of tumor progression within the brain.  Otherwise continues on Tucatinib, Kanjinti, Abraxane as prior with Dr. MJana Hakim  Medications: Current Outpatient Medications on File Prior to Visit  Medication Sig Dispense Refill  . acetaminophen (TYLENOL) 325 MG tablet Take 2 tablets (650 mg total) by mouth every 6 (six) hours as needed for mild pain (or Fever >/= 101).    .Marland Kitchenallopurinol (ZYLOPRIM) 300 MG tablet Take 300 mg by mouth daily.     .Marland KitchenALPRAZolam (XANAX) 1 MG tablet Take 1 mg by mouth daily as needed for anxiety. (Patient not taking: Reported on 09/18/2020)    . furosemide (LASIX) 20 MG tablet Take 1 tablet (20 mg total) by mouth daily. 7 tablet 0  . iron polysaccharides (NIFEREX) 150 MG capsule Take 1 capsule (150 mg total) by mouth daily. 30 capsule 0  . levETIRAcetam (KEPPRA) 750 MG tablet TAKE 1 TABLET BY MOUTH 2 TIMES DAILY. 60 tablet 1  . methadone (DOLOPHINE) 10 MG tablet Take 10 mg by mouth in the morning, at noon, and at bedtime.  (Patient not taking: Reported on 09/18/2020)    . Multiple Vitamin (  MULTIVITAMIN) capsule Take 1 capsule by mouth daily.      . mupirocin ointment (BACTROBAN) 2 % Place 1 application into the nose 2 (two) times daily. (Patient not taking: Reported on 06/12/2020) 22 g 0  . NARCAN 4 MG/0.1ML LIQD nasal spray kit 1 spray as directed. (Patient not taking: Reported on 09/18/2020)    . omeprazole (PRILOSEC) 40 MG capsule Take 1 capsule (40 mg  total) by mouth daily. (Patient not taking: Reported on 06/12/2020) 30 capsule 1  . ondansetron (ZOFRAN) 4 MG tablet Take 1 tablet (4 mg total) by mouth 3 (three) times daily as needed. 20 tablet 3  . Potassium Chloride ER 20 MEQ TBCR Take 1 tablet by mouth daily. 60 tablet 2  . prochlorperazine (COMPAZINE) 10 MG tablet Take 1 tablet (10 mg total) by mouth every 6 (six) hours as needed for nausea or vomiting. 30 tablet 1  . traZODone (DESYREL) 50 MG tablet Take 25-50 mg by mouth at bedtime. (Patient not taking: Reported on 09/18/2020)    . tucatinib (TUKYSA) 50 MG tablet Take 3 tablets (150 mg total) by mouth in the morning. Take as directed by MD. 60 tablet 3   Current Facility-Administered Medications on File Prior to Visit  Medication Dose Route Frequency Provider Last Rate Last Admin  . sodium chloride 0.9 % injection 10 mL  10 mL Intravenous PRN [name], [name], MD   10 mL at 03/04/14 1421  . sodium chloride flush (NS) 0.9 % injection 10 mL  10 mL Intracatheter PRN [name], [name], MD   10 mL at 07/30/19 1627    Allergies:  Allergies  Allergen Reactions  . Tramadol Nausea Only  . Hydrocodone-Acetaminophen Itching and Rash  . Pork-Derived Products Other (See Comments)    Pt says [name] just doesn't eat pork; has no reaction to pork products   Past Medical History:  Past Medical History:  Diagnosis Date  . Breast cancer ([place])   . DVT (deep venous thrombosis) ([place]) 10/07/2011  . Fever 02/06/2018  . Hypertension   . Radiation 11/29/2005-12/29/2005   right supraclavicular area 4147 cGy  . Radiation 06/26/02-08/14/02   right breast 5040 cGy, tumor bed boosted to 1260 cGy  . Rheumatic fever    Past Surgical History:  Past Surgical History:  Procedure Laterality Date  . BREAST SURGERY    . MASTECTOMY Bilateral   . PORT A CATH REVISION     Social History:  Social History   Socioeconomic History  . Marital status: Single    Spouse name: Not on file  . Number of children: Not on  file  . Years of education: Not on file  . Highest education level: Not on file  Occupational History  . Not on file  Tobacco Use  . Smoking status: Former Research scientist (life sciences)  . Smokeless tobacco: Never Used  Vaping Use  . Vaping Use: Never used  Substance and Sexual Activity  . Alcohol use: Never    Comment: Rarely  . Drug use: No  . Sexual activity: Not on file  Other Topics Concern  . Not on file  Social History Narrative  . Not on file   Social Determinants of Health   Financial Resource Strain:   . Difficulty of Paying Living Expenses: Not on file  Food Insecurity:   . Worried About Charity fundraiser in the Last Year: Not on file  . Ran Out of Food in the Last Year: Not on file  Transportation Needs:   .  Lack of Transportation (Medical): Not on file  . Lack of Transportation (Non-Medical): Not on file  Physical Activity:   . Days of Exercise per Week: Not on file  . Minutes of Exercise per Session: Not on file  Stress:   . Feeling of Stress : Not on file  Social Connections:   . Frequency of Communication with Friends and Family: Not on file  . Frequency of Social Gatherings with Friends and Family: Not on file  . Attends Religious Services: Not on file  . Active Member of Clubs or Organizations: Not on file  . Attends Archivist Meetings: Not on file  . Marital Status: Not on file  Intimate Partner Violence:   . Fear of Current or Ex-Partner: Not on file  . Emotionally Abused: Not on file  . Physically Abused: Not on file  . Sexually Abused: Not on file   Family History:  Family History  Problem Relation Age of Onset  . Pancreatic cancer Mother   . Kidney cancer Sister 44  . Breast cancer Sister 19  . Breast cancer Other 33    Review of Systems: Constitutional: Doesn't report fevers, chills or abnormal weight loss Eyes: Doesn't report blurriness of vision Ears, nose, mouth, throat, and face: Doesn't report sore throat Respiratory: Doesn't report  cough, dyspnea or wheezes Cardiovascular: Doesn't report palpitation, chest discomfort  Gastrointestinal:  Doesn't report nausea, constipation, diarrhea GU: Doesn't report incontinence Skin: Doesn't report skin rashes Neurological: Per HPI Musculoskeletal: Doesn't report joint pain Behavioral/Psych: Doesn't report anxiety  Physical Exam: Vitals:   10/05/20 1210  BP: 125/74  Pulse: 67  Resp: 18  Temp: 97.8 F (36.6 C)  SpO2: 100%   KPS: 80. General: Alert, cooperative, pleasant, in no acute distress Head: Normal EENT: No conjunctival injection or scleral icterus.  Lungs: Resp effort normal Cardiac: Regular rate Abdomen: Non-distended abdomen Skin: No rashes cyanosis or petechiae. Extremities: No clubbing or edema  Neurologic Exam: Mental Status: Awake, alert, attentive to examiner. Oriented to self and environment. Language is fluent with intact comprehension.  Cranial Nerves: Visual acuity is grossly normal. Visual fields are full. Extra-ocular movements intact. No ptosis. Face is symmetric Motor: Tone and bulk are normal. Power is full in both arms and legs. Reflexes are symmetric, no pathologic reflexes present.  Sensory: Intact to light touch Gait: Normal.   Labs: I have reviewed the data as listed    Component Value Date/Time   NA 142 09/18/2020 0814   NA 142 11/09/2017 1214   K 3.5 09/18/2020 0814   K 3.9 11/09/2017 1214   CL 105 09/18/2020 0814   CL 101 03/07/2013 1411   CO2 28 09/18/2020 0814   CO2 32 (H) 11/09/2017 1214   GLUCOSE 107 (H) 09/18/2020 0814   GLUCOSE 110 11/09/2017 1214   GLUCOSE 92 03/07/2013 1411   BUN 10 09/18/2020 0814   BUN 19.1 11/09/2017 1214   CREATININE 0.86 09/18/2020 0814   CREATININE 1.0 11/09/2017 1214   CALCIUM 9.1 09/18/2020 0814   CALCIUM 9.1 11/09/2017 1214   PROT 6.4 (L) 09/18/2020 0814   PROT 7.6 11/09/2017 1214   ALBUMIN 3.0 (L) 09/18/2020 0814   ALBUMIN 3.5 11/09/2017 1214   AST 21 09/18/2020 0814   AST 30  11/09/2017 1214   ALT 10 09/18/2020 0814   ALT 19 11/09/2017 1214   ALKPHOS 87 09/18/2020 0814   ALKPHOS 120 11/09/2017 1214   BILITOT 0.4 09/18/2020 0814   BILITOT <0.22 11/09/2017 1214  GFRNONAA >60 09/18/2020 0814   GFRAA >60 07/24/2020 0947   Lab Results  Component Value Date   WBC 10.1 09/18/2020   NEUTROABS 6.4 09/18/2020   HGB 10.8 (L) 09/18/2020   HCT 33.2 (L) 09/18/2020   MCV 86.0 09/18/2020   PLT 351 09/18/2020    Imaging:  MR Brain W Wo Contrast  Addendum Date: 09/22/2020   ADDENDUM REPORT: 09/22/2020 09:53 ADDENDUM: Study discussed by telephone with Dr. Lurline Del on 09/22/2020 at 09:52. And we discussed the possibility of radiation necrosis versus true progression in the right temporal lobe. A trial of empiric medical therapy for radiation necrosis may be valuable. Electronically Signed   By: Genevie Ann [name]   On: 09/22/2020 09:53   Result Date: 09/22/2020 CLINICAL DATA:  64 year old female with metastatic breast cancer. Confusion. Restaging. Status post whole brain radiation in November and December 2020. EXAM: MRI HEAD WITHOUT AND WITH CONTRAST TECHNIQUE: Multiplanar, multiecho pulse sequences of the brain and surrounding structures were obtained without and with intravenous contrast. CONTRAST:  33m GADAVIST GADOBUTROL 1 MMOL/ML IV SOLN COMPARISON:  Head CT 07/21/2020.  Brain MRI 03/20/2020 and earlier. FINDINGS: Brain: Elongated, nodular and enhancing mass in the right mesial an inferior temporal lobe extends to the surface of the brain as before and now measures at least 58 mm AP (greater than 60 mm if slightly discontinuous anterior temporal tip nodules are also included series 18, image 8) x 31 mm transverse x 24 mm CC. This compares to roughly 50 mm by 25 x 20 mm in May are and March. Associated substantial progression of T2 and FLAIR hyperintense confluent edema, which now tracks throughout the right temporal lobe, newly into the right occipital lobe, and cephalad  into the deep white matter capsules nearly contiguous with signal abnormality from the anterior right frontal lobe lesion. Associated mild regional mass effect, including on the atrium of the right lateral ventricle. More heterogeneously enhancing, partially cystic and nodular right anterior inferior frontal gyrus metastasis encompasses 30 x 24 x 26 mm (AP by transverse by CC) versus 28 x 21 x 28 mm when measured in the same fashion in March. This lesion demonstrates nodular enhancement primarily along the anterior and inferior margins. Anterior left frontal lobe T2 and FLAIR hyperintense edema has also increased since May (series 9, image 30). Both lesions demonstrate chronic blood products as before. No new brain metastasis or new area of abnormal enhancement identified. No dural thickening. Progressive widespread cerebral white matter T2 and FLAIR hyperintensity in both hemispheres without mass effect is compatible with sequelae of whole brain radiation. No superimposed restricted diffusion to suggest acute infarction. No midline shift, ventriculomegaly, extra-axial collection or acute intracranial hemorrhage. Cervicomedullary junction and pituitary are within normal limits. Vascular: Major intracranial vascular flow voids are stable. The major dural venous sinuses are enhancing and appear to be patent. Skull and upper cervical spine: Visible bone marrow signal appears stable to improved since March. Heterogeneous signal in the calvarium and cervical spine but no destructive osseous lesion is identified. Sinuses/Orbits: Stable, negative. Other: Chronic left mastoid effusion is stable. Grossly normal visible other internal auditory structures. At the right posterior scalp convexity there is a new irregular 2.5 cm area of abnormal signal in the soft tissues, although no definite enhancement. This might be posttraumatic hematoma. IMPRESSION: 1. Better quality exam compared to May.  Progression of disease. 2.  Enlarged nodular and enhancing right temporal lobe mass (now 5.8 cm or larger long axis) with substantially increased regional  edema and mild regional mass effect. 3. Modest enlargement also of the anterior right frontal lobe cystic and solid mass (3.0 cm), with mild increased regional edema. 4. No new brain metastasis identified. Sequelae of whole brain radiation. 5. New 2.5 cm area of abnormal signal in the right superior scalp favored to be posttraumatic hematoma. Query recent fall. No discrete underlying bone metastasis. Electronically Signed: By: Genevie Ann [name] On: 09/22/2020 09:34   ECHOCARDIOGRAM COMPLETE  Result Date: 10/01/2020    ECHOCARDIOGRAM REPORT   Patient Name:   CHANNING SAVICH  Date of Exam: 09/30/2020 Medical Rec #:  [id number]     Height:       64.0 in Accession #:    [id number]    Weight:       179.4 lb Date of Birth:  04-07-1956     BSA:          1.868 m Patient Age:    23 years      BP:           124/63 mmHg Patient Gender: F             HR:           71 bpm. Exam Location:  [facility] Procedure: 2D Echo, 3D Echo, Cardiac Doppler, Color Doppler and Strain Analysis Indications:    Z09 Chemotherapy  History:        Patient has prior history of Echocardiogram examinations, most                 recent 12/25/2019. Risk Factors:Hypertension and Former Smoker.                 Right Breast Cancer (2003) with Recurrent Mets, Bilateral                 Mastectomy's with Radiation. Now with Metastasis to Brain (2020)                 with Chemotherapy.  Sonographer:    Deliah Boston RDCS Referring Phys: [place]  1. Left ventricular ejection fraction, by estimation, is 50 to 55%. The left ventricle has low normal function. The left ventricle demonstrates regional wall motion abnormalities. Inferior hypokinesis. There is mild left ventricular hypertrophy. Left ventricular diastolic parameters are consistent with Grade I diastolic dysfunction (impaired relaxation). The average  left ventricular global longitudinal strain is 17.4 %.  2. Right ventricular systolic function is normal. The right ventricular size is normal. There is normal pulmonary artery systolic pressure. The estimated right ventricular systolic pressure is 38.2 mmHg.  3. Left atrial size was mildly dilated.  4. The mitral valve is normal in structure. Mild mitral valve regurgitation.  5. The aortic valve was not well visualized. Aortic valve regurgitation is mild. No aortic stenosis is present.  6. The inferior vena cava is normal in size with greater than 50% respiratory variability, suggesting right atrial pressure of 3 mmHg. Conclusion(s)/Recommendation(s): LV systolic function appears reduced compared to prior study 12/25/19. Inferior wall appears hypokinetic. Technically difficult study, would recommend repeat limited echo with contrast to better define systolic function. FINDINGS  Left Ventricle: Left ventricular ejection fraction, by estimation, is 50 to 55%. The left ventricle has low normal function. The left ventricle demonstrates regional wall motion abnormalities. The average left ventricular global longitudinal strain is 17.4 %. The left ventricular internal cavity size was normal in size. There is mild left ventricular hypertrophy. Left ventricular diastolic parameters are consistent with Grade  I diastolic dysfunction (impaired relaxation). Right Ventricle: The right ventricular size is normal. No increase in right ventricular wall thickness. Right ventricular systolic function is normal. There is normal pulmonary artery systolic pressure. The tricuspid regurgitant velocity is 2.29 m/s, and  with an assumed right atrial pressure of 3 mmHg, the estimated right ventricular systolic pressure is 47.0 mmHg. Left Atrium: Left atrial size was mildly dilated. Right Atrium: Right atrial size was normal in size. Pericardium: There is no evidence of pericardial effusion. Mitral Valve: The mitral valve is normal in  structure. Mild mitral valve regurgitation. Tricuspid Valve: The tricuspid valve is normal in structure. Tricuspid valve regurgitation is mild. Aortic Valve: The aortic valve was not well visualized. Aortic valve regurgitation is mild. Aortic regurgitation PHT measures 434 msec. No aortic stenosis is present. Pulmonic Valve: The pulmonic valve was not well visualized. Pulmonic valve regurgitation is not visualized. Aorta: The aortic root and ascending aorta are structurally normal, with no evidence of dilitation. Venous: The inferior vena cava is normal in size with greater than 50% respiratory variability, suggesting right atrial pressure of 3 mmHg. IAS/Shunts: The interatrial septum was not well visualized.  LEFT VENTRICLE PLAX 2D LVIDd:         4.60 cm  Diastology LVIDs:         3.25 cm  LV e' medial:    6.42 cm/s LV PW:         1.00 cm  LV E/e' medial:  7.4 LV IVS:        0.80 cm  LV e' lateral:   9.01 cm/s LVOT diam:     2.50 cm  LV E/e' lateral: 5.3 LV SV:         72 LV SV Index:   38       2D Longitudinal Strain LVOT Area:     4.91 cm 2D Strain GLS (A2C):   12.7 %                         2D Strain GLS (A3C):   19.7 %                         2D Strain GLS (A4C):   19.9 %                         2D Strain GLS Avg:     17.4 %                          3D Volume EF:                         3D EF:        60 %                         LV EDV:       133 ml                         LV ESV:       53 ml                         LV SV:        80 ml RIGHT VENTRICLE RV S prime:  12.40 cm/s TAPSE (M-mode): 1.7 cm LEFT ATRIUM             Index       RIGHT ATRIUM           Index LA diam:        3.70 cm 1.98 cm/m  RA Area:     13.60 cm LA Vol (A2C):   81.2 ml 43.47 ml/m RA Volume:   33.70 ml  18.04 ml/m LA Vol (A4C):   67.0 ml 35.87 ml/m LA Biplane Vol: 76.9 ml 41.17 ml/m  AORTIC VALVE LVOT Vmax:   79.80 cm/s LVOT Vmean:  49.100 cm/s LVOT VTI:    0.146 m AI PHT:      434 msec  AORTA Ao Asc diam: 3.30 cm MITRAL VALVE                TRICUSPID VALVE MV Area (PHT): cm         TR Peak grad:   21.0 mmHg MV Decel Time: 313 msec    TR Vmax:        229.00 cm/s MR Peak grad: 98.4 mmHg MR Mean grad: 64.0 mmHg    SHUNTS MR Vmax:      496.00 cm/s  Systemic VTI:  0.15 m MR Vmean:     374.0 cm/s   Systemic Diam: 2.50 cm MV E velocity: 47.80 cm/s MV A velocity: 57.85 cm/s MV E/A ratio:  0.83 Oswaldo Milian MD Electronically signed by Oswaldo Milian MD Signature Date/Time: 10/01/2020/12:05:13 PM    Final    VAS [name] LOWER EXTREMITY VENOUS (DVT) (ONLY [facility] & [facility] 7a-7p)  Result Date: 09/17/2020  Lower Venous DVT Study Indications: Edema.  Risk Factors: None identified. Limitations: Body habitus and poor ultrasound/tissue interface. Comparison Study: No prior studies. Performing Technologist: Oliver Hum RVT  Examination Guidelines: A complete evaluation includes B-mode imaging, spectral Doppler, color Doppler, and power Doppler as needed of all accessible portions of each vessel. Bilateral testing is considered an integral part of a complete examination. Limited examinations for reoccurring indications may be performed as noted. The reflux portion of the exam is performed with the patient in reverse Trendelenburg.  +---------+---------------+---------+-----------+----------+--------------+ RIGHT    CompressibilityPhasicitySpontaneityPropertiesThrombus Aging +---------+---------------+---------+-----------+----------+--------------+ CFV      Full           Yes      Yes                                 +---------+---------------+---------+-----------+----------+--------------+ SFJ      Full                                                        +---------+---------------+---------+-----------+----------+--------------+ FV Prox  Full                                                        +---------+---------------+---------+-----------+----------+--------------+ FV Mid                  Yes      Yes                                  +---------+---------------+---------+-----------+----------+--------------+  FV Distal               Yes      Yes                                 +---------+---------------+---------+-----------+----------+--------------+ PFV      Full                                                        +---------+---------------+---------+-----------+----------+--------------+ POP      Full           Yes      Yes                                 +---------+---------------+---------+-----------+----------+--------------+ PTV      Full                                                        +---------+---------------+---------+-----------+----------+--------------+ PERO     Full                                                        +---------+---------------+---------+-----------+----------+--------------+ Soleal   Full                                                        +---------+---------------+---------+-----------+----------+--------------+   +----+---------------+---------+-----------+----------+--------------+ LEFTCompressibilityPhasicitySpontaneityPropertiesThrombus Aging +----+---------------+---------+-----------+----------+--------------+ CFV Full           Yes      Yes                                 +----+---------------+---------+-----------+----------+--------------+     Summary: RIGHT: - There is no evidence of deep vein thrombosis in the lower extremity. However, portions of this examination were limited- see technologist comments above.  - No cystic structure found in the popliteal fossa.  LEFT: - No evidence of common femoral vein obstruction.  *See table(s) above for measurements and observations. Electronically signed by Harold Barban MD on 09/17/2020 at 7:32:36 AM.    Final     [place] Clinician Interpretation: I have personally reviewed the radiological images as listed.  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  There is clear progression over 6 month interval involving right temporal and right frontal lesions, temporal greater than frontal.  The etiology of the change, inflammation vs organic tumor, is uncertain.  Rate of change is also uncertain given longer than typical time interval.    Notably, [name] started Tucatinib sometime in the past  2-3 months.  It remains possible that this targeted therapy is achieving CNS control, and changes visualized on MRI are remnants of earlier progressive process.  Without more frequent scans, it is difficult to tell.  We recommended proceeding with 1 month interval follow up MRI.  Also recommended continuing decadron but reduced to 67m daily.  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  Considerations for further progression include craniotomy, further radiation.  [name] and her daughter demonstrated good understanding today.  We spent twenty additional minutes teaching regarding the natural history, biology, and historical experience in the treatment of neurologic complications of cancer.   We appreciate the opportunity to participate in the care of MNieshia Larmon  [name] will return to clinic in ~1 month following next brain study.  All questions were answered. The patient knows to call the clinic with any problems, questions or concerns. No barriers to learning were detected.  The total time spent in the encounter was 40 minutes and more than 50% was on counseling and review of test results   ZVentura Sellers MD Medical Director of Neuro-Oncology [facility] [place]11/29/21 2:38 PM

## 2020-10-07 ENCOUNTER — Other Ambulatory Visit: Payer: Self-pay | Admitting: *Deleted

## 2020-10-07 MED ORDER — ONDANSETRON HCL 4 MG PO TABS: 4.0000 mg | ORAL_TABLET | Freq: Three times a day (TID) | ORAL | 3 refills | Status: AC | PRN

## 2020-10-07 MED ORDER — DEXAMETHASONE 4 MG PO TABS: 4.0000 mg | ORAL_TABLET | Freq: Every day | ORAL | 1 refills | Status: AC

## 2020-10-07 MED ORDER — FUROSEMIDE 20 MG PO TABS
20.0000 mg | ORAL_TABLET | Freq: Every day | ORAL | 0 refills | Status: DC
Start: 2020-10-07 — End: 2020-10-27

## 2020-10-07 MED ORDER — PROCHLORPERAZINE MALEATE 10 MG PO TABS: 10.0000 mg | ORAL_TABLET | Freq: Four times a day (QID) | ORAL | 1 refills | Status: DC | PRN

## 2020-10-12 ENCOUNTER — Other Ambulatory Visit: Payer: Self-pay | Admitting: Oncology

## 2020-10-13 ENCOUNTER — Other Ambulatory Visit: Payer: Self-pay | Admitting: Radiation Therapy

## 2020-10-15 ENCOUNTER — Other Ambulatory Visit: Payer: Self-pay | Admitting: *Deleted

## 2020-10-15 DIAGNOSIS — C7931 Secondary malignant neoplasm of brain: Secondary | ICD-10-CM

## 2020-10-15 NOTE — Progress Notes (Signed)
[place]  [phone] Fax:([phone]) [phone]    ID: [name]   DOB: 05-06-1956  MR#: [id number]  GDJ#:[id number]  Patient Care Team: Patient, No Pcp Per as PCP - General (General Practice) [name], [name] Dad, MD as Consulting Physician (Oncology) [name], MD as Consulting Physician (Radiation Oncology) [name], [name], MD as Consulting Physician (Cardiology) [name], [name], MD as Consulting Physician (Psychiatry) [name], MD as Consulting Physician (Anesthesiology) [name], MD as Consulting Physician (Internal Medicine) SU: [name]. [name], [name] 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   She receives Abraxane/trastuzumab/Pertuzumab monthly, with a dose due today. She underwent repeat echocardiogram on 09/30/2020 showing an ejection fraction of 50-55%.  Since her last visit, she underwent brain MRI on 09/22/2020 showing disease progression. I discussed the results with Dr. [name], who thinks the scan may represent radiation necrosis or it could be organic tumor progression. [name] saw Dr. [name] on 10/05/2020 to discuss the results. Per his note, the recommendation is to proceed with 1 month interval follow up brain MRI.  She was seen by Dr. [name] 10/05/2020.  He found her clinically stable from a neurologic standpoint and reduce her Decadron to 4 mg daily.  He has set her up for a repeat brain MRI 11/14/2019 and visit after tumor board 11/11/2020.   REVIEW OF SYSTEMS: [name]    COVID 19 VACCINATION STATUS:     BREAST CANCER HISTORY: From the  original intake note:  The patient originally presented in May 2003 when she noticed a lump in the upper inner quadrant of her right breast in September 2003.  She sought attention and had a mammogram which showed an obvious carcinoma in the upper inner quadrant of the right breast, approximately 2 cm.  There were some enlarged lymph nodes in the axilla and an FNA done showed those consistent with malignant cells, most likely an invasive ductal carcinoma.  At that point she was unsure of what to do and was referred by Dr. [name] for a discussion of her treatment options.  By biopsy, it was ER/PR negative and HER-2 was 3+.  DNA index was 1.42.  We reiterated that it was most important to have her disease surgically addressed at that point and had recommended lumpectomy and axillary nodes to be addressed with which she followed through and on 04-03-02, Dr. [name] performed a right partial mastectomy and a right axillary lymph node dissection.  Final pathology revealed a 2.4 cm. high grade, Grade III invasive ductal carcinoma with an adjacent .8 cm. also high grade invasive ductal carcinoma which was felt to represent an intramammary metastasis rather than a second primary.  A smaller mass was just medial to the larger mass.  The smaller mass was associated with high grade DCIS component.  There was no definite lymphovascular invasion identified.  However, one of fourteen axillary lymph nodes did contain a 1.5 cm. metastatic deposit.  Postoperatively she did very well.  We reiterated the fact that she did need adjuvant chemotherapy, however, she refused and decided that she would pursue radiation.  She received radiation and completed that on 08-14-02. Her subsequent history is as detailed below.  PAST MEDICAL HISTORY: Past Medical History:  Diagnosis Date  . Breast cancer ([place])   . DVT (deep venous thrombosis) ([place]) 10/07/2011  . Fever 02/06/2018  . Hypertension   . Radiation 11/29/2005-12/29/2005    right supraclavicular area 4147 cGy  . Radiation 06/26/02-08/14/02   right breast 5040 cGy, tumor bed boosted to 1260 cGy  . Rheumatic fever     PAST SURGICAL HISTORY: Past Surgical History:  Procedure Laterality Date  . BREAST SURGERY    . MASTECTOMY Bilateral   . PORT A CATH REVISION      FAMILY HISTORY Family History  Problem Relation Age of Onset  . Pancreatic cancer Mother   . Kidney cancer Sister 19  . Breast cancer Sister 75  . Breast cancer Other 68  The patient's father died in his 74Y  from complications of alcohol abuse. The patient's mother died in her 50s from pancreatic cancer. The patient has 6 brothers, 2 sisters. Both her sisters have had breast cancer, both diagnosed after the age of 76. The patient has not had genetic testing so far.   GYNECOLOGIC HISTORY: Menarche age 52; first live birth age 47. She is GXP4. She is not sure when she stopped having periods. She never used hormone replacement.   SOCIAL HISTORY: The patient has worked in the past as a Psychologist, counselling. At home in addition to the patient are her husband [name], originally from [place], who works as a Administrator.  The other children are [name], who lives with the patient and has a college degree in psychology; she works as a Probation officer; no longer living in the home is her daughter [name] (she is studying to be a Marine scientist) and grandson.son [name], lives in [place] and works as a Higher education careers adviser. [name] also sometimes stays in the home. In addition the patient has an aide who helps her almost daily.    ADVANCED DIRECTIVES: The patient has completed advanced directives and name her daughter [name] as her healthcare power of attorney.  The patient also completed a living will.  This is notarized.  These documents are being sent for scanning on epic on 01/15/2020   HEALTH MAINTENANCE: Social History   Tobacco Use  . Smoking status: Former Research scientist (life sciences)  . Smokeless tobacco: Never Used   Vaping Use  . Vaping Use: Never used  Substance Use Topics  . Alcohol use: Never    Comment: Rarely  . Drug use: No     Colonoscopy:  Not on file  PAP:  Not on file  Bone density:  Not on file  Lipid panel:  Not on file   Allergies  Allergen Reactions  . Tramadol Nausea Only  . Hydrocodone-Acetaminophen Itching and Rash  . Pork-Derived Products Other (See Comments)    Pt says she just doesn't eat pork; has no reaction to pork products    Current Outpatient Medications  Medication Sig Dispense Refill  . acetaminophen (TYLENOL) 325 MG tablet Take 2 tablets (650 mg total) by mouth every 6 (six) hours as needed for mild pain (or Fever >/= 101).    Marland Kitchen allopurinol (ZYLOPRIM) 300 MG tablet TAKE ONE TABLET BY MOUTH EVERY DAY 90 tablet 3  . ALPRAZolam (XANAX) 1 MG tablet Take 1 mg by mouth daily as needed for anxiety. (Patient not taking: Reported on 09/18/2020)    . dexamethasone (DECADRON) 4 MG tablet Take 1 tablet (4 mg total) by mouth daily. 60 tablet 1  . furosemide (LASIX) 20 MG tablet  Take 1 tablet (20 mg total) by mouth daily. 7 tablet 0  . iron polysaccharides (NIFEREX) 150 MG capsule Take 1 capsule (150 mg total) by mouth daily. 30 capsule 0  . levETIRAcetam (KEPPRA) 750 MG tablet TAKE 1 TABLET BY MOUTH 2 TIMES DAILY. 60 tablet 1  . methadone (DOLOPHINE) 10 MG tablet Take 10 mg by mouth in the morning, at noon, and at bedtime.  (Patient not taking: Reported on 09/18/2020)    . Multiple Vitamin (MULTIVITAMIN) capsule Take 1 capsule by mouth daily.      . mupirocin ointment (BACTROBAN) 2 % Place 1 application into the nose 2 (two) times daily. (Patient not taking: Reported on 06/12/2020) 22 g 0  . NARCAN 4 MG/0.1ML LIQD nasal spray kit 1 spray as directed. (Patient not taking: Reported on 09/18/2020)    . omeprazole (PRILOSEC) 40 MG capsule Take 1 capsule (40 mg total) by mouth daily. (Patient not taking: Reported on 06/12/2020) 30 capsule 1  . ondansetron (ZOFRAN) 4 MG tablet Take 1  tablet (4 mg total) by mouth 3 (three) times daily as needed. 20 tablet 3  . Potassium Chloride ER 20 MEQ TBCR Take 1 tablet by mouth daily. 60 tablet 2  . prochlorperazine (COMPAZINE) 10 MG tablet Take 1 tablet (10 mg total) by mouth every 6 (six) hours as needed for nausea or vomiting. 30 tablet 1  . traZODone (DESYREL) 50 MG tablet Take 25-50 mg by mouth at bedtime. (Patient not taking: Reported on 09/18/2020)    . tucatinib (TUKYSA) 50 MG tablet Take 3 tablets (150 mg total) by mouth in the morning. Take as directed by MD. 60 tablet 3   No current facility-administered medications for this visit.   Facility-Administered Medications Ordered in Other Visits  Medication Dose Route Frequency Provider Last Rate Last Admin  . sodium chloride 0.9 % injection 10 mL  10 mL Intravenous PRN Rea Reser, [name] Dad, MD   10 mL at 03/04/14 1421  . sodium chloride flush (NS) 0.9 % injection 10 mL  10 mL Intracatheter PRN Ayala Ribble, [name] Dad, MD   10 mL at 07/30/19 1627    OBJECTIVE: African-American woman in no acute distress  There were no vitals filed for this visit. Wt Readings from Last 3 Encounters:  10/05/20 170 lb 1.6 oz (77.2 kg)  09/18/20 179 lb 6.4 oz (81.4 kg)  08/20/20 162 lb 3.2 oz (73.6 kg)   There is no height or weight on file to calculate BMI.       LAB RESULTS:.  Lab Results  Component Value Date   WBC 10.1 09/18/2020   NEUTROABS 6.4 09/18/2020   HGB 10.8 (L) 09/18/2020   HCT 33.2 (L) 09/18/2020   MCV 86.0 09/18/2020   PLT 351 09/18/2020      Chemistry      Component Value Date/Time   NA 142 09/18/2020 0814   NA 142 11/09/2017 1214   K 3.5 09/18/2020 0814   K 3.9 11/09/2017 1214   CL 105 09/18/2020 0814   CL 101 03/07/2013 1411   CO2 28 09/18/2020 0814   CO2 32 (H) 11/09/2017 1214   BUN 10 09/18/2020 0814   BUN 19.1 11/09/2017 1214   CREATININE 0.86 09/18/2020 0814   CREATININE 1.0 11/09/2017 1214      Component Value Date/Time   CALCIUM 9.1 09/18/2020 0814    CALCIUM 9.1 11/09/2017 1214   ALKPHOS 87 09/18/2020 0814   ALKPHOS 120 11/09/2017 1214   AST 21 09/18/2020 2919  AST 30 11/09/2017 1214   ALT 10 09/18/2020 0814   ALT 19 11/09/2017 1214   BILITOT 0.4 09/18/2020 0814   BILITOT <0.22 11/09/2017 1214       STUDIES: MR Brain W Wo Contrast  Addendum Date: 09/22/2020   ADDENDUM REPORT: 09/22/2020 09:53 ADDENDUM: Study discussed by telephone with Dr. Lurline Del on 09/22/2020 at 09:52. And we discussed the possibility of radiation necrosis versus true progression in the right temporal lobe. A trial of empiric medical therapy for radiation necrosis may be valuable. Electronically Signed   By: Genevie Ann [name]   On: 09/22/2020 09:53   Result Date: 09/22/2020 CLINICAL DATA:  64 year old female with metastatic breast cancer. Confusion. Restaging. Status post whole brain radiation in November and December 2020. EXAM: MRI HEAD WITHOUT AND WITH CONTRAST TECHNIQUE: Multiplanar, multiecho pulse sequences of the brain and surrounding structures were obtained without and with intravenous contrast. CONTRAST:  87m GADAVIST GADOBUTROL 1 MMOL/ML IV SOLN COMPARISON:  Head CT 07/21/2020.  Brain MRI 03/20/2020 and earlier. FINDINGS: Brain: Elongated, nodular and enhancing mass in the right mesial an inferior temporal lobe extends to the surface of the brain as before and now measures at least 58 mm AP (greater than 60 mm if slightly discontinuous anterior temporal tip nodules are also included series 18, image 8) x 31 mm transverse x 24 mm CC. This compares to roughly 50 mm by 25 x 20 mm in May are and March. Associated substantial progression of T2 and FLAIR hyperintense confluent edema, which now tracks throughout the right temporal lobe, newly into the right occipital lobe, and cephalad into the deep white matter capsules nearly contiguous with signal abnormality from the anterior right frontal lobe lesion. Associated mild regional mass effect, including on the  atrium of the right lateral ventricle. More heterogeneously enhancing, partially cystic and nodular right anterior inferior frontal gyrus metastasis encompasses 30 x 24 x 26 mm (AP by transverse by CC) versus 28 x 21 x 28 mm when measured in the same fashion in March. This lesion demonstrates nodular enhancement primarily along the anterior and inferior margins. Anterior left frontal lobe T2 and FLAIR hyperintense edema has also increased since May (series 9, image 30). Both lesions demonstrate chronic blood products as before. No new brain metastasis or new area of abnormal enhancement identified. No dural thickening. Progressive widespread cerebral white matter T2 and FLAIR hyperintensity in both hemispheres without mass effect is compatible with sequelae of whole brain radiation. No superimposed restricted diffusion to suggest acute infarction. No midline shift, ventriculomegaly, extra-axial collection or acute intracranial hemorrhage. Cervicomedullary junction and pituitary are within normal limits. Vascular: Major intracranial vascular flow voids are stable. The major dural venous sinuses are enhancing and appear to be patent. Skull and upper cervical spine: Visible bone marrow signal appears stable to improved since March. Heterogeneous signal in the calvarium and cervical spine but no destructive osseous lesion is identified. Sinuses/Orbits: Stable, negative. Other: Chronic left mastoid effusion is stable. Grossly normal visible other internal auditory structures. At the right posterior scalp convexity there is a new irregular 2.5 cm area of abnormal signal in the soft tissues, although no definite enhancement. This might be posttraumatic hematoma. IMPRESSION: 1. Better quality exam compared to May.  Progression of disease. 2. Enlarged nodular and enhancing right temporal lobe mass (now 5.8 cm or larger long axis) with substantially increased regional edema and mild regional mass effect. 3. Modest enlargement  also of the anterior right frontal lobe cystic and solid mass (  3.0 cm), with mild increased regional edema. 4. No new brain metastasis identified. Sequelae of whole brain radiation. 5. New 2.5 cm area of abnormal signal in the right superior scalp favored to be posttraumatic hematoma. Query recent fall. No discrete underlying bone metastasis. Electronically Signed: By: Genevie Ann [name] On: 09/22/2020 09:34   ECHOCARDIOGRAM COMPLETE  Result Date: 10/01/2020    ECHOCARDIOGRAM REPORT   Patient Name:   [name]  Date of Exam: 09/30/2020 Medical Rec #:  [id number]     Height:       64.0 in Accession #:    [id number]    Weight:       179.4 lb Date of Birth:  [birth year]     BSA:          1.868 m Patient Age:    64 years      BP:           124/63 mmHg Patient Gender: F             HR:           71 bpm. Exam Location:  [facility] Procedure: 2D Echo, 3D Echo, Cardiac Doppler, Color Doppler and Strain Analysis Indications:    Z09 Chemotherapy  History:        Patient has prior history of Echocardiogram examinations, most                 recent 12/25/2019. Risk Factors:Hypertension and Former Smoker.                 Right Breast Cancer (2003) with Recurrent Mets, Bilateral                 Mastectomy's with Radiation. Now with Metastasis to Brain (2020)                 with Chemotherapy.  Sonographer:    Deliah Boston RDCS Referring Phys: [place]  1. Left ventricular ejection fraction, by estimation, is 50 to 55%. The left ventricle has low normal function. The left ventricle demonstrates regional wall motion abnormalities. Inferior hypokinesis. There is mild left ventricular hypertrophy. Left ventricular diastolic parameters are consistent with Grade I diastolic dysfunction (impaired relaxation). The average left ventricular global longitudinal strain is 17.4 %.  2. Right ventricular systolic function is normal. The right ventricular size is normal. There is normal pulmonary artery systolic  pressure. The estimated right ventricular systolic pressure is 47.6 mmHg.  3. Left atrial size was mildly dilated.  4. The mitral valve is normal in structure. Mild mitral valve regurgitation.  5. The aortic valve was not well visualized. Aortic valve regurgitation is mild. No aortic stenosis is present.  6. The inferior vena cava is normal in size with greater than 50% respiratory variability, suggesting right atrial pressure of 3 mmHg. Conclusion(s)/Recommendation(s): LV systolic function appears reduced compared to prior study 12/25/19. Inferior wall appears hypokinetic. Technically difficult study, would recommend repeat limited echo with contrast to better define systolic function. FINDINGS  Left Ventricle: Left ventricular ejection fraction, by estimation, is 50 to 55%. The left ventricle has low normal function. The left ventricle demonstrates regional wall motion abnormalities. The average left ventricular global longitudinal strain is 17.4 %. The left ventricular internal cavity size was normal in size. There is mild left ventricular hypertrophy. Left ventricular diastolic parameters are consistent with Grade I diastolic dysfunction (impaired relaxation). Right Ventricle: The right ventricular size is normal. No increase in right ventricular wall thickness.  Right ventricular systolic function is normal. There is normal pulmonary artery systolic pressure. The tricuspid regurgitant velocity is 2.29 m/s, and  with an assumed right atrial pressure of 3 mmHg, the estimated right ventricular systolic pressure is 25.0 mmHg. Left Atrium: Left atrial size was mildly dilated. Right Atrium: Right atrial size was normal in size. Pericardium: There is no evidence of pericardial effusion. Mitral Valve: The mitral valve is normal in structure. Mild mitral valve regurgitation. Tricuspid Valve: The tricuspid valve is normal in structure. Tricuspid valve regurgitation is mild. Aortic Valve: The aortic valve was not well  visualized. Aortic valve regurgitation is mild. Aortic regurgitation PHT measures 434 msec. No aortic stenosis is present. Pulmonic Valve: The pulmonic valve was not well visualized. Pulmonic valve regurgitation is not visualized. Aorta: The aortic root and ascending aorta are structurally normal, with no evidence of dilitation. Venous: The inferior vena cava is normal in size with greater than 50% respiratory variability, suggesting right atrial pressure of 3 mmHg. IAS/Shunts: The interatrial septum was not well visualized.  LEFT VENTRICLE PLAX 2D LVIDd:         4.60 cm  Diastology LVIDs:         3.25 cm  LV e' medial:    6.42 cm/s LV PW:         1.00 cm  LV E/e' medial:  7.4 LV IVS:        0.80 cm  LV e' lateral:   9.01 cm/s LVOT diam:     2.50 cm  LV E/e' lateral: 5.3 LV SV:         72 LV SV Index:   38       2D Longitudinal Strain LVOT Area:     4.91 cm 2D Strain GLS (A2C):   12.7 %                         2D Strain GLS (A3C):   19.7 %                         2D Strain GLS (A4C):   19.9 %                         2D Strain GLS Avg:     17.4 %                          3D Volume EF:                         3D EF:        60 %                         LV EDV:       133 ml                         LV ESV:       53 ml                         LV SV:        80 ml RIGHT VENTRICLE RV S prime:     12.40 cm/s TAPSE (M-mode): 1.7 cm LEFT ATRIUM  Index       RIGHT ATRIUM           Index LA diam:        3.70 cm 1.98 cm/m  RA Area:     13.60 cm LA Vol (A2C):   81.2 ml 43.47 ml/m RA Volume:   33.70 ml  18.04 ml/m LA Vol (A4C):   67.0 ml 35.87 ml/m LA Biplane Vol: 76.9 ml 41.17 ml/m  AORTIC VALVE LVOT Vmax:   79.80 cm/s LVOT Vmean:  49.100 cm/s LVOT VTI:    0.146 m AI PHT:      434 msec  AORTA Ao Asc diam: 3.30 cm MITRAL VALVE               TRICUSPID VALVE MV Area (PHT): cm         TR Peak grad:   21.0 mmHg MV Decel Time: 313 msec    TR Vmax:        229.00 cm/s MR Peak grad: 98.4 mmHg MR Mean grad: 64.0 mmHg     SHUNTS MR Vmax:      496.00 cm/s  Systemic VTI:  0.15 m MR Vmean:     374.0 cm/s   Systemic Diam: 2.50 cm MV E velocity: 47.80 cm/s MV A velocity: 57.85 cm/s MV E/A ratio:  0.83 [name] Milian MD Electronically signed by [name] Milian MD Signature Date/Time: 10/01/2020/12:05:13 PM    Final    VAS [name] LOWER EXTREMITY VENOUS (DVT) (ONLY [facility] & [facility] 7a-7p)  Result Date: 09/17/2020  Lower Venous DVT Study Indications: Edema.  Risk Factors: None identified. Limitations: Body habitus and poor ultrasound/tissue interface. Comparison Study: No prior studies. Performing Technologist: Oliver Hum RVT  Examination Guidelines: A complete evaluation includes B-mode imaging, spectral Doppler, color Doppler, and power Doppler as needed of all accessible portions of each vessel. Bilateral testing is considered an integral part of a complete examination. Limited examinations for reoccurring indications may be performed as noted. The reflux portion of the exam is performed with the patient in reverse Trendelenburg.  +---------+---------------+---------+-----------+----------+--------------+ RIGHT    CompressibilityPhasicitySpontaneityPropertiesThrombus Aging +---------+---------------+---------+-----------+----------+--------------+ CFV      Full           Yes      Yes                                 +---------+---------------+---------+-----------+----------+--------------+ SFJ      Full                                                        +---------+---------------+---------+-----------+----------+--------------+ FV Prox  Full                                                        +---------+---------------+---------+-----------+----------+--------------+ FV Mid                  Yes      Yes                                 +---------+---------------+---------+-----------+----------+--------------+  FV Distal               Yes      Yes                                  +---------+---------------+---------+-----------+----------+--------------+ PFV      Full                                                        +---------+---------------+---------+-----------+----------+--------------+ POP      Full           Yes      Yes                                 +---------+---------------+---------+-----------+----------+--------------+ PTV      Full                                                        +---------+---------------+---------+-----------+----------+--------------+ PERO     Full                                                        +---------+---------------+---------+-----------+----------+--------------+ Soleal   Full                                                        +---------+---------------+---------+-----------+----------+--------------+   +----+---------------+---------+-----------+----------+--------------+ LEFTCompressibilityPhasicitySpontaneityPropertiesThrombus Aging +----+---------------+---------+-----------+----------+--------------+ CFV Full           Yes      Yes                                 +----+---------------+---------+-----------+----------+--------------+     Summary: RIGHT: - There is no evidence of deep vein thrombosis in the lower extremity. However, portions of this examination were limited- see technologist comments above.  - No cystic structure found in the popliteal fossa.  LEFT: - No evidence of common femoral vein obstruction.  *See table(s) above for measurements and observations. Electronically signed by Harold Barban MD on 09/17/2020 at 7:32:36 AM.    Final      ASSESSMENT:  64 y.o. [place] woman with stage IV breast cancer   (1) Status post right upper inner quadrant lumpectomy and sentinel lymph node sampling 03/04/2002 for 2 separate foci of invasive ductal carcinoma, mpT2 pN1 or stage IIB, both foci grade 3, both estrogen and progesterone receptor positive, both HercepTest  3+, Mib-1 56%  (2) Reexcision for margins 05/27/2002 showed no residual cancer in the breast.  (3) The patient refused adjuvant systemic therapy.  (4) Adjuvant radiation treatment completed 08/14/2002.  (5) recurrence in the right breast in 02/2004 showing a morphologically different tumor, again grade 3, again  estrogen and progesterone receptor negative, with an MIB-1 of 14% and Herceptest 3+.  (5) Between 03/2004 and 07/2004 she received dose dense Doxorubicin/Cyclophosphamide x 4 given with trastuzumab, followed by weekly Docetaxel x 8, again given with trastuzumab.  (6) Right mastectomy 07/13/2004 showed scattered microscopic foci of residual disease over an area  greater than 5 cm. Margins were negative.  (7) Postoperative Docetaxel continued until 09/2004.  (8) Trastuzumab (Herceptin) given 08/2004 through 01/2012 with some brief interruptions.  RECURRENT/ STAGE IV DISEASE December 2006 (9) Isolated right cervical nodal recurrence 10/2005, treated with radiation to the right supraclavicular area (total 41.5 gray) completed 12/29/2005.  (10) Navelbine given together with Herceptin  11/2005 through 03/2006.  (9) Left mastectomy 02/13/2006 for ductal carcinoma in situ, grade 2, estrogen and progesterone receptor negative, with negative margins; 0 of 3 lymph nodes involved  (10) treated with Lapatinib and Capecitabine before 10/2009, for an unclear duration and with unclear results (cannot locate data on chart review).  (11) Status post right supraclavicular lymph node biopsy 09/2010 again positive for an invasive ductal carcinoma, estrogen and progesterone receptor negative, HER-2 positive by C[facility] with a ratio 4.25.  (12) Navelbine given together with Herceptin between 05/2011 and 11/2011.  (13) Carboplatin/ Gemcitabine/ Herceptin given for 2 cycles, in 12/2011 and 01/2012.  (14) TDM-1 (Kadcyla) started 02/2012. Last dose 10/02/2013 after which the patient discontinued treatment  at her own discretion. Echo on December 2014 showed a well preserved ejection fraction.  (15) Deep vein thrombosis of the right upper extremity documented 04/20/2011.  She completed anticoagulant therapy with Coumadin on 03/25/2013.  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 Left chest Port-A-Cath placed on 04/08/2013; being flushed every 6 weeks  RIGHT UPPER EXTREMITY ANGIOSARCOMA VS BREAST CANCER: August 2016 (18) treated at [facility] [place] August 2016 with paclitaxel, trastuzumab and pertuzumab 1 cycle, afterwards referred to hospice  (19) under the care of hospice of [facility] August 2016 to 05/08/2016, when the patient opted for a second try at chemotherapy  (20) started low-dose Abraxane, trastuzumab and pertuzumab 06/07/2016, to be repeated every 4 weeks.   (a) pretreatment echocardiogram 06/06/2016 showed a 60-65% ejection fraction  (b) significant compliance problems compromise response to treatment  (c)  echocardiogram 02/20/17 LVEF 55-60%  (d) Abraxane discontinued 07/04/2017 because of possible neuropathy symptoms  (e) Abraxane resumed 09/27/2017  (f) echocardiogram 07/20/2017 showed an ejection fraction of 60-65%  (g) echo 01/22/2018 showed an ejection fraction of 50-55%  (h) CT scan of the chest 05/03/2018 shows no disease involving the lungs liver or lymph nodes, with stable subcutaneous masses as previously noted  (I) Abraxane discontinued August 2019 because of concerns regarding neuropathy-- gemcitabine substituted  (j) echocardiogram 08/20/2018 shows an ejection fraction in the 55-60% range (done every 6 months)  (k) gemcitabine discontinued because of multiple symptoms, Abraxane restarted 09/11/2018, given once every 4  weeks  (l) echocardiogram 07/17/2019 shows a well-preserved ejection fraction at 55-60%  (m) restaging PET scan 08/20/2019 shows hypermetabolic adenopathy, subcutaneous malignancy as clinically appreciated, but no liver or lung parenchymal lesions  (n) echo 09/30/2020 shows an ejection fraction in the 50-55% range.  There is some inferior hypokinesis in the setting of mild left ventricular hypertrophy.   (21) Chronic pain secondary to known metastatic disease  (a) patient signed  pain contract 10/19/2016  (b) as of 11/16/2016 receives her pain medications from Dr [name]  (c)  Dr [name] no longer able to prescribe as of February 2019  (d) all pain medications now through [facility], Dr Mirna Mires (as of April 2019)  (22) left breast and left axillary adenopathy noted clinically  (a) left axillary lymph node biopsy on 07/24/2019 benign  (23) BRAIN METASTASES: frontal and temporal lobe metastases noted on brain MRI 09/04/2019  (a) adjuvant radiation 09/11/2019-10/16/2019:    The brain was treated to 30 Gy in 10 fractions.   (b) tucatinib prescribed December 2020 at 150 mg daily because of methadone interaction, finally started March 2021 but taken irregularly by the patient.  (c) noncontrast head CT 01/12/2020 stable lesions/improved edema  (d) brain MRI 02/01/2020 shows decreased size of the right frontal and temporal lobe metastases with mild residual edema, no mass-effect, and no evidence of new mets  (e) noncontrast MRI of the cervical, thoracic, and lumbar spine 02/11/2020 and 02/12/2020 showed no metastatic disease  (f) noncontrast head CT on 07/21/2020 shows increase in edema in the right temporal lobe without midline shift  (g) brain MRI shows disease progression versus posttreatment change with substantial increase in regional edema.  (h) brain MRI 11/13/2020  (24) COVID-19 infection 2019-11-19   PLAN:  Marsia did not show for her 10/16/2020 visit and treatment and did  not call to reschedule.  I am going to reschedule her for a visit and treatment Thursday, January 16.  I will ask her to bring her home meds with her so we can do a thorough review of what she is taking and specifically how she is taking the tucatinib.  Clearly her brain situation is the most important at this point and that will be reevaluated in January by Dr. [name].  I want to make sure that she has what ever she needs to get through the holidays before her next visit with him.   [name]. Nuri Larmer, MD 10/15/20 2:05 PM Medical Oncology and Hematology [facility] [address], [place] [address] Tel. [phone]    Fax. [phone]   I, Wilburn Mylar, am acting as scribe for Dr. Virgie [name]. Roben Schliep.  I, Lurline Del MD, have reviewed the above documentation for accuracy and completeness, and I agree with the above.    *Total Encounter Time as defined by the Centers for Medicare and Medicaid Services includes, in addition to the face-to-face time of a patient visit (documented in the note above) non-face-to-face time: obtaining and reviewing outside history, ordering and reviewing medications, tests or procedures, care coordination (communications with other health care professionals or caregivers) and documentation in the medical record.

## 2020-10-16 ENCOUNTER — Inpatient Hospital Stay (HOSPITAL_BASED_OUTPATIENT_CLINIC_OR_DEPARTMENT_OTHER): Payer: Medicare Other | Admitting: Oncology

## 2020-10-16 ENCOUNTER — Inpatient Hospital Stay: Payer: Medicare Other

## 2020-10-16 ENCOUNTER — Inpatient Hospital Stay: Payer: Medicare Other | Attending: Oncology

## 2020-10-16 DIAGNOSIS — C7931 Secondary malignant neoplasm of brain: Secondary | ICD-10-CM

## 2020-10-16 DIAGNOSIS — C50211 Malignant neoplasm of upper-inner quadrant of right female breast: Secondary | ICD-10-CM

## 2020-10-16 DIAGNOSIS — Z170 Estrogen receptor positive status [ER+]: Secondary | ICD-10-CM

## 2020-10-16 DIAGNOSIS — C50911 Malignant neoplasm of unspecified site of right female breast: Secondary | ICD-10-CM

## 2020-10-16 DIAGNOSIS — C4911 Malignant neoplasm of connective and soft tissue of right upper limb, including shoulder: Secondary | ICD-10-CM

## 2020-10-17 ENCOUNTER — Encounter: Payer: Self-pay | Admitting: Oncology

## 2020-10-20 ENCOUNTER — Telehealth: Payer: Self-pay | Admitting: Oncology

## 2020-10-20 MED FILL — TUKYSA 150 MG TABS: 150 | 30 days supply | Qty: 30 | Fill #1

## 2020-10-20 NOTE — Telephone Encounter (Signed)
Scheduled appts per 12/10 los and 12/14 staff msg. Left voicemail with appt date and time.

## 2020-10-21 NOTE — Progress Notes (Signed)
[place]  Telephone:([phone]) [phone] Fax:(336) [phone]    ID: [name]   DOB: 11/21/1955  MR#: [id number]  LNL#:[id number]  Patient Care Team: Patient, No Pcp Per as PCP - General (General Practice) [name], [name] Dad, MD as Consulting Physician (Oncology) [name] Pita, MD as Consulting Physician (Radiation Oncology) [name], [name], MD as Consulting Physician (Cardiology) [name], [name] Lav, MD as Consulting Physician (Psychiatry) [name] Hale, MD as Consulting Physician (Anesthesiology) [name] Garter, MD as Consulting Physician (Internal Medicine) SU: [name]. [name], [name] 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  She receives Abraxane/trastuzumab/Pertuzumab monthly, with a dose due today. She underwent repeat echocardiogram on 09/30/2020 showing an ejection fraction of 50-55%.  Since her last visit, she underwent brain MRI on 09/22/2020 showing disease progression. I discussed the results with [name]. Mickeal [name], who thinks the scan may represent radiation necrosis or it could be organic tumor progression. [place]vee saw [name]. Mickeal [name] on 10/05/2020 to discuss the results. Per his note, the recommendation is to proceed with 1 month interval follow up brain MRI.  She was seen by [name]. Mickeal [name] 10/05/2020.  He found her clinically stable from a neurologic standpoint and reduce her Decadron to 4 mg daily.  He has set her up for a repeat brain MRI 11/14/2019 and visit after tumor board 11/23/2020.   REVIEW OF SYSTEMS: [name]    COVID 19 VACCINATION STATUS:     BREAST CANCER HISTORY: From the original intake note:  The patient originally presented in May 2003  when she noticed a lump in the upper inner quadrant of her right breast in September 2003.  She sought attention and had a mammogram which showed an obvious carcinoma in the upper inner quadrant of the right breast, approximately 2 cm.  There were some enlarged lymph nodes in the axilla and an FNA done showed those consistent with malignant cells, most likely an invasive ductal carcinoma.  At that point she was unsure of what to do and was referred by [name]. Marylene [name] for a discussion of her treatment options.  By biopsy, it was ER/PR negative and HER-2 was 3+.  DNA index was 1.42.  We reiterated that it was most important to have her disease surgically addressed at that point and had recommended lumpectomy and axillary nodes to be addressed with which she followed through and on 04-03-02, [name]. Annamaria [name] performed a right partial mastectomy and a right axillary lymph node dissection.  Final pathology revealed a 2.4 cm. high grade, Grade III invasive ductal carcinoma with an adjacent .8 cm. also high grade invasive ductal carcinoma which was felt to represent an intramammary metastasis rather than a second primary.  A smaller mass was just medial to the larger mass.  The smaller mass was associated with high grade DCIS component.  There was no definite lymphovascular invasion identified.  However, one of fourteen axillary lymph nodes did contain a 1.5 cm. metastatic deposit.  Postoperatively she did very well.  We reiterated the fact that she did need adjuvant chemotherapy, however, she refused and decided that she would pursue radiation.  She received radiation and completed that on 08-14-02. Her subsequent history is as detailed below.   PAST MEDICAL HISTORY: Past Medical History:  Diagnosis Date  .  Breast cancer ([place])   . DVT (deep venous thrombosis) ([place]) 10/07/2011  . Fever 02/06/2018  . Hypertension   . Radiation 11/29/2005-12/29/2005   right supraclavicular area 4147 cGy  . Radiation 06/26/02-08/14/02    right breast 5040 cGy, tumor bed boosted to 1260 cGy  . Rheumatic fever     PAST SURGICAL HISTORY: Past Surgical History:  Procedure Laterality Date  . BREAST SURGERY    . MASTECTOMY Bilateral   . PORT A CATH REVISION      FAMILY HISTORY Family History  Problem Relation Age of Onset  . Pancreatic cancer Mother   . Kidney cancer Sister 55  . Breast cancer Sister 33  . Breast cancer Other 37  The patient's father died in his 44W  from complications of alcohol abuse. The patient's mother died in her 70s from pancreatic cancer. The patient has 6 brothers, 2 sisters. Both her sisters have had breast cancer, both diagnosed after the age of 63. The patient has not had genetic testing so far.   GYNECOLOGIC HISTORY: Menarche age 32; first live birth age 26. She is GXP4. She is not sure when she stopped having periods. She never used hormone replacement.   SOCIAL HISTORY: The patient has worked in the past as a Psychologist, counselling. At home in addition to the patient are her husband [name], originally from [place], who works as a Administrator.  The other children are [name], who lives with the patient and has a college degree in psychology; she works as a Probation officer; no longer living in the home is her daughter [name] (she is studying to be a Marine scientist) and grandson.son [name], lives in [place] and works as a Higher education careers adviser. Son [name] also sometimes stays in the home. In addition the patient has an aide who helps her almost daily.    ADVANCED DIRECTIVES: The patient has completed advanced directives and name her daughter [name] as her healthcare power of attorney.  The patient also completed a living will.  This is notarized.  These documents are being sent for scanning on epic on 01/15/2020   HEALTH MAINTENANCE: Social History   Tobacco Use  . Smoking status: Former Research scientist (life sciences)  . Smokeless tobacco: Never Used  Vaping Use  . Vaping Use: Never used  Substance Use Topics  .  Alcohol use: Never    Comment: Rarely  . Drug use: No     Colonoscopy:  Not on file  PAP:  Not on file  Bone density:  Not on file  Lipid panel:  Not on file   Allergies  Allergen Reactions  . Tramadol Nausea Only  . Hydrocodone-Acetaminophen Itching and Rash  . Pork-Derived Products Other (See Comments)    Pt says she just doesn't eat pork; has no reaction to pork products    Current Outpatient Medications  Medication Sig Dispense Refill  . acetaminophen (TYLENOL) 325 MG tablet Take 2 tablets (650 mg total) by mouth every 6 (six) hours as needed for mild pain (or Fever >/= 101).    Marland Kitchen allopurinol (ZYLOPRIM) 300 MG tablet TAKE ONE TABLET BY MOUTH EVERY DAY 90 tablet 3  . ALPRAZolam (XANAX) 1 MG tablet Take 1 mg by mouth daily as needed for anxiety. (Patient not taking: Reported on 09/18/2020)    . dexamethasone (DECADRON) 4 MG tablet Take 1 tablet (4 mg total) by mouth daily. 60 tablet 1  . furosemide (LASIX) 20 MG tablet Take 1 tablet (20 mg total) by mouth daily. 7 tablet  0  . iron polysaccharides (NIFEREX) 150 MG capsule Take 1 capsule (150 mg total) by mouth daily. 30 capsule 0  . levETIRAcetam (KEPPRA) 750 MG tablet TAKE 1 TABLET BY MOUTH 2 TIMES DAILY. 60 tablet 1  . methadone (DOLOPHINE) 10 MG tablet Take 10 mg by mouth in the morning, at noon, and at bedtime.  (Patient not taking: Reported on 09/18/2020)    . Multiple Vitamin (MULTIVITAMIN) capsule Take 1 capsule by mouth daily.      . mupirocin ointment (BACTROBAN) 2 % Place 1 application into the nose 2 (two) times daily. (Patient not taking: Reported on 06/12/2020) 22 g 0  . NARCAN 4 MG/0.1ML LIQD nasal spray kit 1 spray as directed. (Patient not taking: Reported on 09/18/2020)    . omeprazole (PRILOSEC) 40 MG capsule Take 1 capsule (40 mg total) by mouth daily. (Patient not taking: Reported on 06/12/2020) 30 capsule 1  . ondansetron (ZOFRAN) 4 MG tablet Take 1 tablet (4 mg total) by mouth 3 (three) times daily as needed. 20  tablet 3  . Potassium Chloride ER 20 MEQ TBCR Take 1 tablet by mouth daily. 60 tablet 2  . prochlorperazine (COMPAZINE) 10 MG tablet Take 1 tablet (10 mg total) by mouth every 6 (six) hours as needed for nausea or vomiting. 30 tablet 1  . traZODone (DESYREL) 50 MG tablet Take 25-50 mg by mouth at bedtime. (Patient not taking: Reported on 09/18/2020)    . tucatinib (TUKYSA) 50 MG tablet Take 3 tablets (150 mg total) by mouth in the morning. Take as directed by MD. 60 tablet 3   No current facility-administered medications for this visit.   Facility-Administered Medications Ordered in Other Visits  Medication Dose Route Frequency Provider Last Rate Last Admin  . sodium chloride 0.9 % injection 10 mL  10 mL Intravenous PRN Lisel Siegrist, [name] Dad, MD   10 mL at 03/04/14 1421  . sodium chloride flush (NS) 0.9 % injection 10 mL  10 mL Intracatheter PRN Cadince Hilscher, [name] Dad, MD   10 mL at 07/30/19 1627    OBJECTIVE:   There were no vitals filed for this visit. Wt Readings from Last 3 Encounters:  10/05/20 170 lb 1.6 oz (77.2 kg)  09/18/20 179 lb 6.4 oz (81.4 kg)  08/20/20 162 lb 3.2 oz (73.6 kg)   There is no height or weight on file to calculate BMI.      LAB RESULTS:.  Lab Results  Component Value Date   WBC 10.1 09/18/2020   NEUTROABS 6.4 09/18/2020   HGB 10.8 (L) 09/18/2020   HCT 33.2 (L) 09/18/2020   MCV 86.0 09/18/2020   PLT 351 09/18/2020      Chemistry      Component Value Date/Time   NA 142 09/18/2020 0814   NA 142 11/09/2017 1214   K 3.5 09/18/2020 0814   K 3.9 11/09/2017 1214   CL 105 09/18/2020 0814   CL 101 03/07/2013 1411   CO2 28 09/18/2020 0814   CO2 32 (H) 11/09/2017 1214   BUN 10 09/18/2020 0814   BUN 19.1 11/09/2017 1214   CREATININE 0.86 09/18/2020 0814   CREATININE 1.0 11/09/2017 1214      Component Value Date/Time   CALCIUM 9.1 09/18/2020 0814   CALCIUM 9.1 11/09/2017 1214   ALKPHOS 87 09/18/2020 0814   ALKPHOS 120 11/09/2017 1214   AST 21  09/18/2020 0814   AST 30 11/09/2017 1214   ALT 10 09/18/2020 0814   ALT 19 11/09/2017 1214  BILITOT 0.4 09/18/2020 0814   BILITOT <0.22 11/09/2017 1214       STUDIES: ECHOCARDIOGRAM COMPLETE  Result Date: 10/01/2020    ECHOCARDIOGRAM REPORT   Patient Name:   [name]  Date of Exam: 09/30/2020 Medical Rec #:  [id number]     Height:       64.0 in Accession #:    [id number]    Weight:       179.4 lb Date of Birth:  September 06, 1956     BSA:          1.868 m Patient Age:    14 years      BP:           124/63 mmHg Patient Gender: F             HR:           71 bpm. Exam Location:  [facility] Procedure: 2D Echo, 3D Echo, Cardiac Doppler, Color Doppler and Strain Analysis Indications:    Z09 Chemotherapy  History:        Patient has prior history of Echocardiogram examinations, most                 recent 12/25/2019. Risk Factors:Hypertension and Former Smoker.                 Right Breast Cancer (2003) with Recurrent Mets, Bilateral                 Mastectomy's with Radiation. Now with Metastasis to Brain (2020)                 with Chemotherapy.  Sonographer:    Deliah Boston RDCS Referring Phys: [place]  1. Left ventricular ejection fraction, by estimation, is 50 to 55%. The left ventricle has low normal function. The left ventricle demonstrates regional wall motion abnormalities. Inferior hypokinesis. There is mild left ventricular hypertrophy. Left ventricular diastolic parameters are consistent with Grade I diastolic dysfunction (impaired relaxation). The average left ventricular global longitudinal strain is 17.4 %.  2. Right ventricular systolic function is normal. The right ventricular size is normal. There is normal pulmonary artery systolic pressure. The estimated right ventricular systolic pressure is 93.9 mmHg.  3. Left atrial size was mildly dilated.  4. The mitral valve is normal in structure. Mild mitral valve regurgitation.  5. The aortic valve was not well  visualized. Aortic valve regurgitation is mild. No aortic stenosis is present.  6. The inferior vena cava is normal in size with greater than 50% respiratory variability, suggesting right atrial pressure of 3 mmHg. Conclusion(s)/Recommendation(s): LV systolic function appears reduced compared to prior study 12/25/19. Inferior wall appears hypokinetic. Technically difficult study, would recommend repeat limited echo with contrast to better define systolic function. FINDINGS  Left Ventricle: Left ventricular ejection fraction, by estimation, is 50 to 55%. The left ventricle has low normal function. The left ventricle demonstrates regional wall motion abnormalities. The average left ventricular global longitudinal strain is 17.4 %. The left ventricular internal cavity size was normal in size. There is mild left ventricular hypertrophy. Left ventricular diastolic parameters are consistent with Grade I diastolic dysfunction (impaired relaxation). Right Ventricle: The right ventricular size is normal. No increase in right ventricular wall thickness. Right ventricular systolic function is normal. There is normal pulmonary artery systolic pressure. The tricuspid regurgitant velocity is 2.29 m/s, and  with an assumed right atrial pressure of 3 mmHg, the estimated right ventricular systolic pressure is 24.0  mmHg. Left Atrium: Left atrial size was mildly dilated. Right Atrium: Right atrial size was normal in size. Pericardium: There is no evidence of pericardial effusion. Mitral Valve: The mitral valve is normal in structure. Mild mitral valve regurgitation. Tricuspid Valve: The tricuspid valve is normal in structure. Tricuspid valve regurgitation is mild. Aortic Valve: The aortic valve was not well visualized. Aortic valve regurgitation is mild. Aortic regurgitation PHT measures 434 msec. No aortic stenosis is present. Pulmonic Valve: The pulmonic valve was not well visualized. Pulmonic valve regurgitation is not visualized.  Aorta: The aortic root and ascending aorta are structurally normal, with no evidence of dilitation. Venous: The inferior vena cava is normal in size with greater than 50% respiratory variability, suggesting right atrial pressure of 3 mmHg. IAS/Shunts: The interatrial septum was not well visualized.  LEFT VENTRICLE PLAX 2D LVIDd:         4.60 cm  Diastology LVIDs:         3.25 cm  LV e' medial:    6.42 cm/s LV PW:         1.00 cm  LV E/e' medial:  7.4 LV IVS:        0.80 cm  LV e' lateral:   9.01 cm/s LVOT diam:     2.50 cm  LV E/e' lateral: 5.3 LV SV:         72 LV SV Index:   38       2D Longitudinal Strain LVOT Area:     4.91 cm 2D Strain GLS (A2C):   12.7 %                         2D Strain GLS (A3C):   19.7 %                         2D Strain GLS (A4C):   19.9 %                         2D Strain GLS Avg:     17.4 %                          3D Volume EF:                         3D EF:        60 %                         LV EDV:       133 ml                         LV ESV:       53 ml                         LV SV:        80 ml RIGHT VENTRICLE RV S prime:     12.40 cm/s TAPSE (M-mode): 1.7 cm LEFT ATRIUM             Index       RIGHT ATRIUM           Index LA diam:        3.70 cm 1.98 cm/m  RA Area:  13.60 cm LA Vol (A2C):   81.2 ml 43.47 ml/m RA Volume:   33.70 ml  18.04 ml/m LA Vol (A4C):   67.0 ml 35.87 ml/m LA Biplane Vol: 76.9 ml 41.17 ml/m  AORTIC VALVE LVOT Vmax:   79.80 cm/s LVOT Vmean:  49.100 cm/s LVOT VTI:    0.146 m AI PHT:      434 msec  AORTA Ao Asc diam: 3.30 cm MITRAL VALVE               TRICUSPID VALVE MV Area (PHT): cm         TR Peak grad:   21.0 mmHg MV Decel Time: 313 msec    TR Vmax:        229.00 cm/s MR Peak grad: 98.4 mmHg MR Mean grad: 64.0 mmHg    SHUNTS MR Vmax:      496.00 cm/s  Systemic VTI:  0.15 m MR Vmean:     374.0 cm/s   Systemic Diam: 2.50 cm MV E velocity: 47.80 cm/s MV A velocity: 57.85 cm/s MV E/A ratio:  0.83 Oswaldo Milian MD Electronically signed by  Oswaldo Milian MD Signature Date/Time: 10/01/2020/12:05:13 PM    Final      ASSESSMENT:  64 y.o. [place] woman with stage IV breast cancer   (1) Status post right upper inner quadrant lumpectomy and sentinel lymph node sampling 03/04/2002 for 2 separate foci of invasive ductal carcinoma, mpT2 pN1 or stage IIB, both foci grade 3, both estrogen and progesterone receptor positive, both HercepTest 3+, Mib-1 56%  (2) Reexcision for margins 05/27/2002 showed no residual cancer in the breast.  (3) The patient refused adjuvant systemic therapy.  (4) Adjuvant radiation treatment completed 08/14/2002.  (5) recurrence in the right breast in 02/2004 showing a morphologically different tumor, again grade 3, again estrogen and progesterone receptor negative, with an MIB-1 of 14% and Herceptest 3+.  (5) Between 03/2004 and 07/2004 she received dose dense Doxorubicin/Cyclophosphamide x 4 given with trastuzumab, followed by weekly Docetaxel x 8, again given with trastuzumab.  (6) Right mastectomy 07/13/2004 showed scattered microscopic foci of residual disease over an area  greater than 5 cm. Margins were negative.  (7) Postoperative Docetaxel continued until 09/2004.  (8) Trastuzumab (Herceptin) given 08/2004 through 01/2012 with some brief interruptions.  RECURRENT/ STAGE IV DISEASE December 2006 (9) Isolated right cervical nodal recurrence 10/2005, treated with radiation to the right supraclavicular area (total 41.5 gray) completed 12/29/2005.  (10) Navelbine given together with Herceptin  11/2005 through 03/2006.  (9) Left mastectomy 02/13/2006 for ductal carcinoma in situ, grade 2, estrogen and progesterone receptor negative, with negative margins; 0 of 3 lymph nodes involved  (10) treated with Lapatinib and Capecitabine before 10/2009, for an unclear duration and with unclear results (cannot locate data on chart review).  (11) Status post right supraclavicular lymph node biopsy 09/2010  again positive for an invasive ductal carcinoma, estrogen and progesterone receptor negative, HER-2 positive by CISH with a ratio 4.25.  (12) Navelbine given together with Herceptin between 05/2011 and 11/2011.  (13) Carboplatin/ Gemcitabine/ Herceptin given for 2 cycles, in 12/2011 and 01/2012.  (14) TDM-1 ([place]) started 02/2012. Last dose 10/02/2013 after which the patient discontinued treatment at her own discretion. Echo on December 2014 showed a well preserved ejection fraction.  (15) Deep vein thrombosis of the right upper extremity documented 04/20/2011.  She completed anticoagulant therapy with Coumadin on 03/25/2013.  (16) Chronic right upper extremity lymphedema, not responsive to aggressive PT  (a) biopsy of  denuded area 04/23/2015 read as dermatofibroma (discordant)  (b) deeper cuts of 04/23/2015 biopsy suggest angiosarcoma  (17) Right chest port-a-cath removal due to infection on 01/28/2013. Left chest Port-A-Cath placed on 04/08/2013; being flushed every 6 weeks  RIGHT UPPER EXTREMITY ANGIOSARCOMA VS BREAST CANCER: August 2016 (18) treated at [facility] of [place] August 2016 with paclitaxel, trastuzumab and pertuzumab 1 cycle, afterwards referred to hospice  (19) under the care of hospice of [facility] August 2016 to 05/08/2016, when the patient opted for a second try at chemotherapy  (20) started low-dose Abraxane, trastuzumab and pertuzumab 06/07/2016, to be repeated every 4 weeks.   (a) pretreatment echocardiogram 06/06/2016 showed a 60-65% ejection fraction  (b) significant compliance problems compromise response to treatment  (c)  echocardiogram 02/20/17 LVEF 55-60%  (d) Abraxane discontinued 07/04/2017 because of possible neuropathy symptoms  (e) Abraxane resumed 09/27/2017  (f) echocardiogram 07/20/2017 showed an ejection fraction of 60-65%  (g) echo 01/22/2018 showed an ejection fraction of 50-55%  (h) CT scan of the chest 05/03/2018 shows no disease  involving the lungs liver or lymph nodes, with stable subcutaneous masses as previously noted  (I) Abraxane discontinued August 2019 because of concerns regarding neuropathy-- gemcitabine substituted  (j) echocardiogram 08/20/2018 shows an ejection fraction in the 55-60% range (done every 6 months)  (k) gemcitabine discontinued because of multiple symptoms, Abraxane restarted 09/11/2018, given once every 4 weeks  (l) echocardiogram 07/17/2019 shows a well-preserved ejection fraction at 55-60%  (m) restaging PET scan 08/20/2019 shows hypermetabolic adenopathy, subcutaneous malignancy as clinically appreciated, but no liver or lung parenchymal lesions  (n) echo 09/30/2020 shows an ejection fraction in the 50-55% range.  There is some inferior hypokinesis in the setting of mild left ventricular hypertrophy.   (21) Chronic pain secondary to known metastatic disease  (a) patient signed pain contract 10/19/2016  (b) as of 11/16/2016 receives her pain medications from [name]  (c)  [name] no longer able to prescribe as of February 2019  (d) all pain medications now through [facility], [name] Mirna Mires (as of April 2019)  (22) left breast and left axillary adenopathy noted clinically  (a) left axillary lymph node biopsy on 07/24/2019 benign  (23) BRAIN METASTASES: frontal and temporal lobe metastases noted on brain MRI 09/04/2019  (a) adjuvant radiation 09/11/2019-10/16/2019:    The brain was treated to 30 Gy in 10 fractions.   (b) tucatinib prescribed December 2020 at 150 mg daily because of methadone interaction, finally started March 2021 but taken irregularly by the patient.  (c) noncontrast head CT 01/12/2020 stable lesions/improved edema  (d) brain MRI 02/01/2020 shows decreased size of the right frontal and temporal lobe metastases with mild residual edema, no mass-effect, and no evidence of new mets  (e) noncontrast MRI of the cervical, thoracic, and lumbar spine 02/11/2020 and  02/12/2020 showed no metastatic disease  (f) noncontrast head CT on 07/21/2020 shows increase in edema in the right temporal lobe without midline shift  (g) brain MRI shows disease progression versus posttreatment change with substantial increase in regional edema.  (h) brain MRI 11/13/2020  (24) COVID-19 infection 2019-11-19   PLAN: Kaelin did not show for her 10/16/2020 or for her rescheduled 1216 visit.  We did send a letter previously.  At this time we will give her a phone call.  She is scheduled for follow-up with neuro-oncology in January.  She may feel that the peripheral disease does not require so much attention while the central nervous system disease is  being evaluated.  At any rate we will make every effort to contact her and make ourselves available to her as needed.  [name]. Armistead Sult, MD 10/22/20 5:17 PM Medical Oncology and Hematology [facility] [place], [place] [address] Tel. [phone]    Fax. [phone]   I, Wilburn Mylar, am acting as scribe for [name]. Virgie [name]. Dallas Torok.  I, Lurline Del MD, have reviewed the above documentation for accuracy and completeness, and I agree with the above.   *Total Encounter Time as defined by the Centers for Medicare and Medicaid Services includes, in addition to the face-to-face time of a patient visit (documented in the note above) non-face-to-face time: obtaining and reviewing outside history, ordering and reviewing medications, tests or procedures, care coordination (communications with other health care professionals or caregivers) and documentation in the medical record.

## 2020-10-22 ENCOUNTER — Inpatient Hospital Stay: Payer: Medicare Other

## 2020-10-22 ENCOUNTER — Other Ambulatory Visit: Payer: Medicare Other

## 2020-10-22 ENCOUNTER — Inpatient Hospital Stay (HOSPITAL_BASED_OUTPATIENT_CLINIC_OR_DEPARTMENT_OTHER): Payer: Medicare Other | Admitting: Oncology

## 2020-10-22 ENCOUNTER — Ambulatory Visit: Payer: Medicare Other | Admitting: Oncology

## 2020-10-22 DIAGNOSIS — C50211 Malignant neoplasm of upper-inner quadrant of right female breast: Secondary | ICD-10-CM

## 2020-10-22 DIAGNOSIS — C50911 Malignant neoplasm of unspecified site of right female breast: Secondary | ICD-10-CM

## 2020-10-22 DIAGNOSIS — C7931 Secondary malignant neoplasm of brain: Secondary | ICD-10-CM

## 2020-10-22 DIAGNOSIS — C4911 Malignant neoplasm of connective and soft tissue of right upper limb, including shoulder: Secondary | ICD-10-CM

## 2020-10-22 DIAGNOSIS — Z170 Estrogen receptor positive status [ER+]: Secondary | ICD-10-CM

## 2020-10-26 ENCOUNTER — Telehealth: Payer: Self-pay | Admitting: *Deleted

## 2020-10-26 NOTE — Telephone Encounter (Signed)
Patient called and left a message to see if the antibiotic that she had requested during her last visit was called in.  Upon calling her back to see what she was needing she was unable to articulate which medication and what reason she was needing medication for.  Confirmed that the decadron was ordered and she said she got that one.

## 2020-10-27 ENCOUNTER — Other Ambulatory Visit: Payer: Self-pay | Admitting: Oncology

## 2020-10-27 ENCOUNTER — Other Ambulatory Visit: Payer: Self-pay | Admitting: *Deleted

## 2020-10-27 MED ORDER — FUROSEMIDE 20 MG PO TABS: 20.0000 mg | ORAL_TABLET | Freq: Every day | ORAL | 0 refills | Status: DC

## 2020-10-27 MED ORDER — POTASSIUM CHLORIDE ER 20 MEQ PO TBCR: 1.0000 | EXTENDED_RELEASE_TABLET | Freq: Every day | ORAL | 2 refills | Status: AC

## 2020-10-28 ENCOUNTER — Other Ambulatory Visit: Payer: Self-pay | Admitting: Oncology

## 2020-10-28 ENCOUNTER — Other Ambulatory Visit: Payer: Self-pay | Admitting: *Deleted

## 2020-10-28 MED ORDER — TRIAMTERENE-HCTZ 37.5-25 MG PO TABS: 1.0000 | ORAL_TABLET | Freq: Every day | ORAL | 1 refills | Status: AC

## 2020-10-28 NOTE — Telephone Encounter (Signed)
Pt called stating need for refill on " my water pill ".  Noted lasix on med list- refill sent to pharmacy.  Received via fax refill request from pharmacy for HCTZ.  This RN called [facility] and discontinued the lasix prescription- refill for HCTZ sent.  Lasix removed from pt's med list.

## 2020-11-03 ENCOUNTER — Emergency Department (HOSPITAL_COMMUNITY)
Admission: EM | Admit: 2020-11-03 | Discharge: 2020-11-03 | Disposition: A | Discharge status: Home / Self Care | Payer: Medicare Other | Attending: Emergency Medicine | Admitting: Emergency Medicine

## 2020-11-03 ENCOUNTER — Other Ambulatory Visit: Payer: Self-pay

## 2020-11-03 ENCOUNTER — Encounter (HOSPITAL_COMMUNITY): Payer: Self-pay

## 2020-11-03 DIAGNOSIS — M79605 Pain in left leg: Secondary | ICD-10-CM | POA: Diagnosis present

## 2020-11-03 DIAGNOSIS — M79604 Pain in right leg: Secondary | ICD-10-CM | POA: Insufficient documentation

## 2020-11-03 DIAGNOSIS — Z5321 Procedure and treatment not carried out due to patient leaving prior to being seen by health care provider: Secondary | ICD-10-CM | POA: Diagnosis not present

## 2020-11-03 DIAGNOSIS — M79602 Pain in left arm: Secondary | ICD-10-CM | POA: Diagnosis not present

## 2020-11-03 DIAGNOSIS — R609 Edema, unspecified: Secondary | ICD-10-CM | POA: Insufficient documentation

## 2020-11-03 DIAGNOSIS — M79601 Pain in right arm: Secondary | ICD-10-CM | POA: Diagnosis not present

## 2020-11-03 NOTE — ED Notes (Signed)
Called [facility] for room placement. Eloped from waiting area.  

## 2020-11-03 NOTE — ED Triage Notes (Addendum)
Patient c/o bilateral leg pain, especially thighs x 2 days. Patient has swelling to the thighs. Patient c/o bilateral arm pain since yesterday.

## 2020-11-11 ENCOUNTER — Telehealth: Payer: Self-pay

## 2020-11-11 NOTE — Telephone Encounter (Signed)
Pt left voicemail inquiring when next chemotherapy appointment is.  RN returned call, voicemail obtained.  Left appointment time, and date on appointments with call back number.

## 2020-11-13 ENCOUNTER — Other Ambulatory Visit: Payer: Self-pay

## 2020-11-13 ENCOUNTER — Inpatient Hospital Stay: Payer: Medicare Other

## 2020-11-13 ENCOUNTER — Telehealth: Payer: Self-pay | Admitting: Radiation Therapy

## 2020-11-13 ENCOUNTER — Ambulatory Visit: Payer: Medicare Other

## 2020-11-13 ENCOUNTER — Inpatient Hospital Stay: Payer: Medicare Other | Attending: Oncology

## 2020-11-13 ENCOUNTER — Ambulatory Visit
Admission: RE | Admit: 2020-11-13 | Discharge: 2020-11-13 | Disposition: A | Discharge status: Home / Self Care | Payer: Medicare Other | Source: Ambulatory Visit | Attending: Internal Medicine | Admitting: Internal Medicine

## 2020-11-13 ENCOUNTER — Other Ambulatory Visit: Payer: Medicare Other

## 2020-11-13 ENCOUNTER — Inpatient Hospital Stay: Payer: Medicare Other | Admitting: Adult Health

## 2020-11-13 DIAGNOSIS — C7931 Secondary malignant neoplasm of brain: Secondary | ICD-10-CM

## 2020-11-13 MED ORDER — GADOBENATE DIMEGLUMINE 529 MG/ML IV SOLN
15.0000 mL | Freq: Once | INTRAVENOUS | Status: AC | PRN
  Administered 2020-11-13: 15 mL via INTRAVENOUS

## 2020-11-13 NOTE — Telephone Encounter (Signed)
I noticed that Ms. Tew did not show up for her Medical Oncology appointments this morning, so I called to remind her about her brain MRI scheduled this afternoon, not wanting her to miss that too. The cell vm box was full, but I was able to leave a detailed message on her home phone line to remind her of the brain MRI scheduled later this afternoon.   Mont Dutton R.T.(R)(T) Radiation Special Procedures Navigator

## 2020-11-16 ENCOUNTER — Inpatient Hospital Stay: Payer: Medicare Other | Admitting: Internal Medicine

## 2020-11-16 ENCOUNTER — Inpatient Hospital Stay (HOSPITAL_COMMUNITY): Payer: Medicare Other

## 2020-11-16 ENCOUNTER — Inpatient Hospital Stay (HOSPITAL_COMMUNITY)
Admission: EM | Admit: 2020-11-16 | Discharge: 2020-12-08 | DRG: 100 | Disposition: E | Payer: Medicare Other | Attending: Internal Medicine | Admitting: Internal Medicine

## 2020-11-16 ENCOUNTER — Inpatient Hospital Stay: Payer: Medicare Other

## 2020-11-16 ENCOUNTER — Other Ambulatory Visit: Payer: Self-pay | Admitting: Oncology

## 2020-11-16 ENCOUNTER — Emergency Department (HOSPITAL_COMMUNITY): Payer: Medicare Other

## 2020-11-16 ENCOUNTER — Encounter (HOSPITAL_COMMUNITY): Payer: Self-pay

## 2020-11-16 ENCOUNTER — Other Ambulatory Visit: Payer: Self-pay

## 2020-11-16 DIAGNOSIS — G8929 Other chronic pain: Secondary | ICD-10-CM | POA: Diagnosis present

## 2020-11-16 DIAGNOSIS — R29898 Other symptoms and signs involving the musculoskeletal system: Secondary | ICD-10-CM

## 2020-11-16 DIAGNOSIS — Z20822 Contact with and (suspected) exposure to covid-19: Secondary | ICD-10-CM | POA: Diagnosis present

## 2020-11-16 DIAGNOSIS — Z87891 Personal history of nicotine dependence: Secondary | ICD-10-CM | POA: Diagnosis not present

## 2020-11-16 DIAGNOSIS — R569 Unspecified convulsions: Secondary | ICD-10-CM

## 2020-11-16 DIAGNOSIS — R4182 Altered mental status, unspecified: Secondary | ICD-10-CM | POA: Diagnosis present

## 2020-11-16 DIAGNOSIS — G893 Neoplasm related pain (acute) (chronic): Secondary | ICD-10-CM | POA: Diagnosis not present

## 2020-11-16 DIAGNOSIS — I959 Hypotension, unspecified: Secondary | ICD-10-CM | POA: Diagnosis present

## 2020-11-16 DIAGNOSIS — G9389 Other specified disorders of brain: Secondary | ICD-10-CM | POA: Diagnosis not present

## 2020-11-16 DIAGNOSIS — I10 Essential (primary) hypertension: Secondary | ICD-10-CM | POA: Diagnosis present

## 2020-11-16 DIAGNOSIS — Z8051 Family history of malignant neoplasm of kidney: Secondary | ICD-10-CM | POA: Diagnosis not present

## 2020-11-16 DIAGNOSIS — Z79899 Other long term (current) drug therapy: Secondary | ICD-10-CM

## 2020-11-16 DIAGNOSIS — D649 Anemia, unspecified: Secondary | ICD-10-CM | POA: Diagnosis present

## 2020-11-16 DIAGNOSIS — Z515 Encounter for palliative care: Secondary | ICD-10-CM

## 2020-11-16 DIAGNOSIS — C7940 Secondary malignant neoplasm of unspecified part of nervous system: Secondary | ICD-10-CM | POA: Diagnosis present

## 2020-11-16 DIAGNOSIS — Z9013 Acquired absence of bilateral breasts and nipples: Secondary | ICD-10-CM | POA: Diagnosis not present

## 2020-11-16 DIAGNOSIS — Z800 Family history of malignant neoplasm of digestive organs: Secondary | ICD-10-CM

## 2020-11-16 DIAGNOSIS — R262 Difficulty in walking, not elsewhere classified: Secondary | ICD-10-CM | POA: Diagnosis present

## 2020-11-16 DIAGNOSIS — C50911 Malignant neoplasm of unspecified site of right female breast: Secondary | ICD-10-CM | POA: Diagnosis present

## 2020-11-16 DIAGNOSIS — Z803 Family history of malignant neoplasm of breast: Secondary | ICD-10-CM | POA: Diagnosis not present

## 2020-11-16 DIAGNOSIS — R531 Weakness: Secondary | ICD-10-CM | POA: Diagnosis not present

## 2020-11-16 DIAGNOSIS — G40901 Epilepsy, unspecified, not intractable, with status epilepticus: Secondary | ICD-10-CM | POA: Diagnosis present

## 2020-11-16 DIAGNOSIS — Z923 Personal history of irradiation: Secondary | ICD-10-CM | POA: Diagnosis not present

## 2020-11-16 DIAGNOSIS — C7931 Secondary malignant neoplasm of brain: Secondary | ICD-10-CM | POA: Diagnosis present

## 2020-11-16 DIAGNOSIS — J9601 Acute respiratory failure with hypoxia: Secondary | ICD-10-CM | POA: Diagnosis present

## 2020-11-16 DIAGNOSIS — Z86718 Personal history of other venous thrombosis and embolism: Secondary | ICD-10-CM

## 2020-11-16 DIAGNOSIS — Z66 Do not resuscitate: Secondary | ICD-10-CM | POA: Diagnosis not present

## 2020-11-16 DIAGNOSIS — Z9221 Personal history of antineoplastic chemotherapy: Secondary | ICD-10-CM

## 2020-11-16 DIAGNOSIS — Z885 Allergy status to narcotic agent status: Secondary | ICD-10-CM | POA: Diagnosis not present

## 2020-11-16 DIAGNOSIS — E876 Hypokalemia: Secondary | ICD-10-CM | POA: Diagnosis present

## 2020-11-16 DIAGNOSIS — G9341 Metabolic encephalopathy: Secondary | ICD-10-CM | POA: Diagnosis present

## 2020-11-16 DIAGNOSIS — G936 Cerebral edema: Secondary | ICD-10-CM | POA: Diagnosis present

## 2020-11-16 DIAGNOSIS — R1310 Dysphagia, unspecified: Secondary | ICD-10-CM | POA: Diagnosis present

## 2020-11-16 DIAGNOSIS — Z7189 Other specified counseling: Secondary | ICD-10-CM | POA: Diagnosis not present

## 2020-11-16 LAB — CBC WITH DIFFERENTIAL/PLATELET
Abs Immature Granulocytes: 0.1 10*3/uL — ABNORMAL HIGH (ref 0.00–0.07)
Basophils Absolute: 0 10*3/uL (ref 0.0–0.1)
Basophils Relative: 0 %
Eosinophils Absolute: 0.1 10*3/uL (ref 0.0–0.5)
Eosinophils Relative: 1 %
HCT: 32.8 % — ABNORMAL LOW (ref 36.0–46.0)
Hemoglobin: 10.4 g/dL — ABNORMAL LOW (ref 12.0–15.0)
Immature Granulocytes: 1 %
Lymphocytes Relative: 15 %
Lymphs Abs: 1.3 10*3/uL (ref 0.7–4.0)
MCH: 28.8 pg (ref 26.0–34.0)
MCHC: 31.7 g/dL (ref 30.0–36.0)
MCV: 90.9 fL (ref 80.0–100.0)
Monocytes Absolute: 0.6 10*3/uL (ref 0.1–1.0)
Monocytes Relative: 7 %
Neutro Abs: 6.8 10*3/uL (ref 1.7–7.7)
Neutrophils Relative %: 76 %
Platelets: 218 10*3/uL (ref 150–400)
RBC: 3.61 MIL/uL — ABNORMAL LOW (ref 3.87–5.11)
RDW: 16.6 % — ABNORMAL HIGH (ref 11.5–15.5)
WBC: 9 10*3/uL (ref 4.0–10.5)
nRBC: 0 % (ref 0.0–0.2)

## 2020-11-16 LAB — RESP PANEL BY RT-PCR (FLU A&B, COVID) ARPGX2
Influenza A by PCR: NEGATIVE
Influenza B by PCR: NEGATIVE
SARS Coronavirus 2 by RT PCR: NEGATIVE

## 2020-11-16 LAB — URINALYSIS, ROUTINE W REFLEX MICROSCOPIC
Bilirubin Urine: NEGATIVE
Glucose, UA: NEGATIVE mg/dL
Hgb urine dipstick: NEGATIVE
Ketones, ur: 5 mg/dL — AB
Leukocytes,Ua: NEGATIVE
Nitrite: NEGATIVE
Protein, ur: NEGATIVE mg/dL
Specific Gravity, Urine: 1.034 — ABNORMAL HIGH (ref 1.005–1.030)
pH: 5 (ref 5.0–8.0)

## 2020-11-16 LAB — CBG MONITORING, ED: Glucose-Capillary: 156 mg/dL — ABNORMAL HIGH (ref 70–99)

## 2020-11-16 LAB — COMPREHENSIVE METABOLIC PANEL
ALT: 18 U/L (ref 0–44)
AST: 22 U/L (ref 15–41)
Albumin: 3.3 g/dL — ABNORMAL LOW (ref 3.5–5.0)
Alkaline Phosphatase: 49 U/L (ref 38–126)
Anion gap: 10 (ref 5–15)
BUN: 25 mg/dL — ABNORMAL HIGH (ref 8–23)
CO2: 23 mmol/L (ref 22–32)
Calcium: 8.2 mg/dL — ABNORMAL LOW (ref 8.9–10.3)
Chloride: 109 mmol/L (ref 98–111)
Creatinine, Ser: 0.9 mg/dL (ref 0.44–1.00)
GFR, Estimated: >60 mL/min (ref >60)
Glucose, Bld: 98 mg/dL (ref 70–99)
Potassium: 3.3 mmol/L — ABNORMAL LOW (ref 3.5–5.1)
Sodium: 142 mmol/L (ref 135–145)
Total Bilirubin: 0.6 mg/dL (ref 0.3–1.2)
Total Protein: 5.8 g/dL — ABNORMAL LOW (ref 6.5–8.1)

## 2020-11-16 LAB — TROPONIN I (HIGH SENSITIVITY)
Troponin I (High Sensitivity): 9 ng/L (ref <18)
Troponin I (High Sensitivity): 9 ng/L (ref <18)

## 2020-11-16 LAB — BRAIN NATRIURETIC PEPTIDE: B Natriuretic Peptide: 17.4 pg/mL (ref 0.0–100.0)

## 2020-11-16 LAB — MAGNESIUM: Magnesium: 1.6 mg/dL — ABNORMAL LOW (ref 1.7–2.4)

## 2020-11-16 MED ORDER — CHLORHEXIDINE GLUCONATE CLOTH 2 % EX PADS
6.0000 | MEDICATED_PAD | Freq: Every day | CUTANEOUS | Status: DC
  Administered 2020-11-17 – 2020-11-19 (×3): 6 via TOPICAL

## 2020-11-16 MED ORDER — ONDANSETRON HCL 4 MG/2ML IJ SOLN: 4.0000 mg | Freq: Four times a day (QID) | INTRAMUSCULAR | Status: DC | PRN

## 2020-11-16 MED ORDER — SODIUM CHLORIDE 0.9 % IV SOLN
20.0000 mg/kg | Freq: Once | INTRAVENOUS | Status: AC
  Administered 2020-11-16: 1452 mg via INTRAVENOUS
  Filled 2020-11-16: qty 29.04

## 2020-11-16 MED ORDER — MAGNESIUM SULFATE 2 GM/50ML IV SOLN
2.0000 g | Freq: Once | INTRAVENOUS | Status: AC
  Administered 2020-11-16: 2 g via INTRAVENOUS
  Filled 2020-11-16: qty 50

## 2020-11-16 MED ORDER — ACETAMINOPHEN 325 MG PO TABS
650.0000 mg | ORAL_TABLET | Freq: Four times a day (QID) | ORAL | Status: DC | PRN
Start: 2020-11-16 — End: 2020-11-19

## 2020-11-16 MED ORDER — DEXAMETHASONE SODIUM PHOSPHATE 4 MG/ML IJ SOLN
4.0000 mg | Freq: Four times a day (QID) | INTRAMUSCULAR | Status: DC
  Administered 2020-11-16 – 2020-11-19 (×12): 4 mg via INTRAVENOUS
  Filled 2020-11-16 (×17): qty 1

## 2020-11-16 MED ORDER — ONDANSETRON HCL 4 MG PO TABS
4.0000 mg | ORAL_TABLET | Freq: Four times a day (QID) | ORAL | Status: DC | PRN
Start: 2020-11-16 — End: 2020-11-19

## 2020-11-16 MED ORDER — LEVETIRACETAM 500 MG/5ML IV SOLN
750.0000 mg | Freq: Two times a day (BID) | INTRAVENOUS | Status: DC
Start: 2020-11-16 — End: 2020-11-16

## 2020-11-16 MED ORDER — ACETAMINOPHEN 650 MG RE SUPP: 650.0000 mg | Freq: Four times a day (QID) | RECTAL | Status: DC | PRN

## 2020-11-16 MED ORDER — LORAZEPAM 2 MG/ML IJ SOLN
INTRAMUSCULAR | Status: AC
  Administered 2020-11-16: 1 mg via INTRAVENOUS
  Filled 2020-11-16: qty 1

## 2020-11-16 MED ORDER — PHENYTOIN SODIUM 50 MG/ML IJ SOLN
100.0000 mg | Freq: Three times a day (TID) | INTRAMUSCULAR | Status: DC
  Administered 2020-11-17 – 2020-11-19 (×7): 100 mg via INTRAVENOUS
  Filled 2020-11-16 (×10): qty 2

## 2020-11-16 MED ORDER — POTASSIUM CHLORIDE 10 MEQ/100ML IV SOLN
10.0000 meq | INTRAVENOUS | Status: AC
  Administered 2020-11-16 (×3): 10 meq via INTRAVENOUS
  Filled 2020-11-16 (×3): qty 100

## 2020-11-16 MED ORDER — LORAZEPAM 2 MG/ML IJ SOLN
1.0000 mg | Freq: Once | INTRAMUSCULAR | Status: DC
  Administered 2020-11-17: 1 mg via INTRAVENOUS
  Filled 2020-11-16: qty 1

## 2020-11-16 MED ORDER — HYDRALAZINE HCL 20 MG/ML IJ SOLN: 10.0000 mg | Freq: Three times a day (TID) | INTRAMUSCULAR | Status: DC | PRN

## 2020-11-16 MED ORDER — LEVETIRACETAM IN NACL 1000 MG/100ML IV SOLN
1000.0000 mg | Freq: Once | INTRAVENOUS | Status: AC
  Administered 2020-11-16: 1000 mg via INTRAVENOUS
  Filled 2020-11-16: qty 100

## 2020-11-16 MED ORDER — LORAZEPAM 2 MG/ML IJ SOLN: 1.0000 mg | Freq: Once | INTRAMUSCULAR | Status: AC

## 2020-11-16 MED ORDER — TUCATINIB 50 MG PO TABS: 150.0000 mg | ORAL_TABLET | Freq: Every morning | ORAL | 6 refills | Status: DC

## 2020-11-16 MED ORDER — LEVETIRACETAM IN NACL 1000 MG/100ML IV SOLN
1000.0000 mg | Freq: Two times a day (BID) | INTRAVENOUS | Status: DC
  Administered 2020-11-16 – 2020-11-17 (×2): 1000 mg via INTRAVENOUS
  Filled 2020-11-16 (×2): qty 100

## 2020-11-16 MED ORDER — LEVETIRACETAM IN NACL 1000 MG/100ML IV SOLN: 1000.0000 mg | Freq: Once | INTRAVENOUS | Status: DC

## 2020-11-16 NOTE — Consult Note (Signed)
Neurology Consultation  Reason for Consult: Altered mental status Referring Physician: Dr. Cherylann Ratel  CC: Altered mental status, seizures  History is obtained from: Chart review  HPI: Selisa Tensley is a 65 y.o. female past medical history of breast cancer with mets but brain mets, normal state of health 3 days ago and brought in for altered mental status. Per chart review, 2 days ago family noted that the patient is mobile throughout the day but then stayed in bed the next day.  When asked to move around the house, they found that she was having difficulty walking.  Her symptoms progressed throughout yesterday and she was brought into the emergency room for further evaluation.  Remain confused.  In the ER had 2 seizures witnessed by the ED staff. Discussed with outpatient neuro oncologist who recommended increased dexamethasone due to the apparent worsening on the 11/13/2020 MRI brain.  Continues to remain encephalopathic and is unable to provide any history  ROS:   Unable to obtain due to altered mental status.   Past Medical History:  Diagnosis Date  . Breast cancer ([place])   . DVT (deep venous thrombosis) ([place]) 10/07/2011  . Fever 02/06/2018  . Hypertension   . Radiation 11/29/2005-12/29/2005   right supraclavicular area 4147 cGy  . Radiation 06/26/02-08/14/02   right breast 5040 cGy, tumor bed boosted to 1260 cGy  . Rheumatic fever     Family History  Problem Relation Age of Onset  . Pancreatic cancer Mother   . Kidney cancer Sister 54  . Breast cancer Sister 9  . Breast cancer Other 104     Social History:   reports that she has quit smoking. She has never used smokeless tobacco. She reports that she does not drink alcohol and does not use drugs.  Medications  Current Facility-Administered Medications:  .  acetaminophen (TYLENOL) tablet 650 mg, 650 mg, Oral, Q6H PRN **OR** acetaminophen (TYLENOL) suppository 650 mg, 650 mg, Rectal, Q6H PRN, Marylyn Ishihara, Tyrone A, DO .   dexamethasone (DECADRON) injection 4 mg, 4 mg, Intravenous, Q6H, Kyle, Tyrone A, DO, 4 mg at 11/26/2020 1146 .  hydrALAZINE (APRESOLINE) injection 10 mg, 10 mg, Intravenous, Q8H PRN, Marylyn Ishihara, Tyrone A, DO .  levETIRAcetam (KEPPRA) IVPB 1000 mg/100 mL premix, 1,000 mg, Intravenous, BID, Kyle, Tyrone A, DO, Stopped at 12/02/2020 1612 .  LORazepam (ATIVAN) injection 1 mg, 1 mg, Intravenous, Once, Drenda Freeze, MD .  ondansetron [facility]) tablet 4 mg, 4 mg, Oral, Q6H PRN **OR** ondansetron (ZOFRAN) injection 4 mg, 4 mg, Intravenous, Q6H PRN, Marylyn Ishihara, Tyrone A, DO .  potassium chloride 10 mEq in 100 mL IVPB, 10 mEq, Intravenous, Q1 Hr x 4, Kyle, Tyrone A, DO, Last Rate: 100 mL/hr at 11/24/2020 1702, 10 mEq at 12/05/2020 1702  Current Outpatient Medications:  .  cloNIDine (CATAPRES) 0.1 MG tablet, Take 0.1 mg by mouth 2 (two) times daily., Disp: , Rfl:  .  furosemide (LASIX) 20 MG tablet, Take 20 mg by mouth daily., Disp: , Rfl:  .  ondansetron (ZOFRAN-ODT) 4 MG disintegrating tablet, Take 4 mg by mouth every 8 (eight) hours as needed for nausea or vomiting., Disp: , Rfl:  .  oxyCODONE (ROXICODONE) 15 MG immediate release tablet, Take 15 mg by mouth every 4 (four) hours as needed for pain., Disp: , Rfl:  .  QUEtiapine (SEROQUEL) 25 MG tablet, Take 25 mg by mouth at bedtime., Disp: , Rfl:  .  sertraline (ZOLOFT) 100 MG tablet, Take 100 mg by mouth daily., Disp: ,  Rfl:  .  tucatinib (TUKYSA) 150 MG tablet, Take 150 mg by mouth daily. Take every 12 hrs at the same time each day with or without a meal., Disp: , Rfl:  .  acetaminophen (TYLENOL) 325 MG tablet, Take 2 tablets (650 mg total) by mouth every 6 (six) hours as needed for mild pain (or Fever >/= 101)., Disp:  , Rfl:  .  allopurinol (ZYLOPRIM) 300 MG tablet, TAKE ONE TABLET BY MOUTH EVERY DAY (Patient taking differently: Take 300 mg by mouth daily.), Disp: 90 tablet, Rfl: 3 .  ALPRAZolam (XANAX) 1 MG tablet, Take 1 mg by mouth daily as needed for anxiety.  (Patient not taking: Reported on 09/18/2020), Disp: , Rfl:  .  dexamethasone (DECADRON) 4 MG tablet, Take 1 tablet (4 mg total) by mouth daily. (Patient taking differently: Take 4 mg by mouth 2 (two) times daily.), Disp: 60 tablet, Rfl: 1 .  iron polysaccharides (NIFEREX) 150 MG capsule, Take 1 capsule (150 mg total) by mouth daily., Disp: 30 capsule, Rfl: 0 .  levETIRAcetam (KEPPRA) 750 MG tablet, TAKE 1 TABLET BY MOUTH 2 TIMES DAILY. (Patient taking differently: Take 750 mg by mouth 2 (two) times daily.), Disp: 60 tablet, Rfl: 1 .  methadone (DOLOPHINE) 10 MG tablet, Take 10 mg by mouth in the morning, at noon, and at bedtime., Disp: , Rfl:  .  Multiple Vitamin (MULTIVITAMIN) capsule, Take 1 capsule by mouth daily.  , Disp: , Rfl:  .  mupirocin ointment (BACTROBAN) 2 %, Place 1 application into the nose 2 (two) times daily. (Patient not taking: Reported on 06/12/2020), Disp: 22 g, Rfl: 0 .  NARCAN 4 MG/0.1ML LIQD nasal spray kit, 1 spray as directed. (Patient not taking: Reported on 09/18/2020), Disp: , Rfl:  .  omeprazole (PRILOSEC) 40 MG capsule, Take 1 capsule (40 mg total) by mouth daily. (Patient not taking: Reported on 06/12/2020), Disp: 30 capsule, Rfl: 1 .  ondansetron (ZOFRAN) 4 MG tablet, Take 1 tablet (4 mg total) by mouth 3 (three) times daily as needed., Disp: 20 tablet, Rfl: 3 .  Potassium Chloride ER 20 MEQ TBCR, Take 1 tablet by mouth daily., Disp: 60 tablet, Rfl: 2 .  prochlorperazine (COMPAZINE) 10 MG tablet, Take 1 tablet (10 mg total) by mouth every 6 (six) hours as needed for nausea or vomiting., Disp: 30 tablet, Rfl: 1 .  traZODone (DESYREL) 50 MG tablet, Take 25-50 mg by mouth at bedtime. (Patient not taking: Reported on 09/18/2020), Disp: , Rfl:  .  triamterene-hydrochlorothiazide (MAXZIDE-25) 37.5-25 MG tablet, Take 1 tablet by mouth daily., Disp: 30 tablet, Rfl: 1 .  tucatinib (TUKYSA) 50 MG tablet, Take 3 tablets (150 mg total) by mouth in the morning. Take as directed by MD.,  Disp: 60 tablet, Rfl: 3 .  tucatinib (TUKYSA) 50 MG tablet, Take 3 tablets (150 mg total) by mouth every morning. Take every 12 hr at the same time each day with or without a meal. (Patient not taking: Reported on 11/19/2020), Disp: 60 tablet, Rfl: 6  Facility-Administered Medications Ordered in Other Encounters:  .  sodium chloride 0.9 % injection 10 mL, 10 mL, Intravenous, PRN, [name], [name], MD, 10 mL at 03/04/14 1421 .  sodium chloride flush (NS) 0.9 % injection 10 mL, 10 mL, Intracatheter, PRN, [name], [name], MD, 10 mL at 07/30/19 1627  Exam: Current vital signs: BP (!) 141/92   Pulse 86   Temp 98.6 F (37 C) (Oral)   Resp (!) 22  Ht '5\' 6"'  (1.676 m)   Wt 72.6 kg   SpO2 91%   BMI 25.83 kg/m  Vital signs in last 24 hours: Temp:  [98.6 F (37 C)] 98.6 F (37 C) (01/10 0942) Pulse Rate:  [55-86] 86 (01/10 1530) Resp:  [21-26] 22 (01/10 1530) BP: (128-144)/(72-92) 141/92 (01/10 1530) SpO2:  [91 %-100 %] 91 % (01/10 1530) Weight:  [72.6 kg] 72.6 kg (01/10 0937) General: Awake alert in no distress HEENT: Cephalic Intermatic Lungs: Clear Cardiovascular: Regular rhythm Abdomen soft nondistended nontender Extremities warm well perfused Neurological exam Awake alert was able to tell me her name and her age. Unable to tell me the month. As I was examining her she had head version to the left and gaze to the left that resolved within a few seconds. Her speech remained garbled after that. Cranial nerves: Pupils equal round reactive light, extraocular movements appear intact, visual fields full, mild left lower facial weakness, tongue and palate midline. Motor examination: Left upper extremity is 0/5, left lower extremity is 3/5.  Right upper and lower extremity are 4/5. She does not follow commands to do a proper examination Sensory exam: Grimace to noxious simulation in all but does not withdraw left upper extremity.   Labs I have reviewed labs in epic and the results  pertinent to this consultation are:   CBC    Component Value Date/Time   WBC 9.0 11/25/2020 1000   RBC 3.61 (L) 11/23/2020 1000   HGB 10.4 (L) 11/26/2020 1000   HGB 10.8 (L) 09/18/2020 0814   HGB 11.3 (L) 11/09/2017 1214   HCT 32.8 (L) 11/29/2020 1000   HCT 35.3 11/09/2017 1214   PLT 218 11/08/2020 1000   PLT 351 09/18/2020 0814   PLT 231 11/09/2017 1214   MCV 90.9 11/25/2020 1000   MCV 83.9 11/09/2017 1214   MCH 28.8 11/12/2020 1000   MCHC 31.7 11/28/2020 1000   RDW 16.6 (H) 11/13/2020 1000   RDW 16.9 (H) 11/09/2017 1214   LYMPHSABS 1.3 11/22/2020 1000   LYMPHSABS 1.5 11/09/2017 1214   MONOABS 0.6 12/07/2020 1000   MONOABS 0.7 11/09/2017 1214   EOSABS 0.1 11/21/2020 1000   EOSABS 0.3 11/09/2017 1214   BASOSABS 0.0 12/07/2020 1000   BASOSABS 0.1 11/09/2017 1214    CMP     Component Value Date/Time   NA 142 12/04/2020 1000   NA 142 11/09/2017 1214   K 3.3 (L) 12/06/2020 1000   K 3.9 11/09/2017 1214   CL 109 11/23/2020 1000   CL 101 03/07/2013 1411   CO2 23 11/10/2020 1000   CO2 32 (H) 11/09/2017 1214   GLUCOSE 98 11/09/2020 1000   GLUCOSE 110 11/09/2017 1214   GLUCOSE 92 03/07/2013 1411   BUN 25 (H) 11/15/2020 1000   BUN 19.1 11/09/2017 1214   CREATININE 0.90 11/19/2020 1000   CREATININE 0.86 09/18/2020 0814   CREATININE 1.0 11/09/2017 1214   CALCIUM 8.2 (L) 11/29/2020 1000   CALCIUM 9.1 11/09/2017 1214   PROT 5.8 (L) 11/14/2020 1000   PROT 7.6 11/09/2017 1214   ALBUMIN 3.3 (L) 11/11/2020 1000   ALBUMIN 3.5 11/09/2017 1214   AST 22 11/24/2020 1000   AST 21 09/18/2020 0814   AST 30 11/09/2017 1214   ALT 18 11/25/2020 1000   ALT 10 09/18/2020 0814   ALT 19 11/09/2017 1214   ALKPHOS 49 11/19/2020 1000   ALKPHOS 120 11/09/2017 1214   BILITOT 0.6 11/25/2020 1000   BILITOT 0.4 09/18/2020 0960  BILITOT <0.22 11/09/2017 1214   GFRNONAA >60 11/09/2020 1000   GFRNONAA >60 09/18/2020 0814   GFRAA >60 07/24/2020 0947    Imaging I have reviewed the images  obtained:  CT-scan of the brain-grossly unchanged appearance of the hemorrhagic right cerebral metastases, perilesional edema and 5 mm leftward midline shift when compared to the MRI 11/13/2020 MRI 1/7/2022Shows interval progression of a large mass lesion in the right temporal lobe with surrounding edema compatible with tumor progression.  There is also progression of right frontal cystic metastases.  Increased mass-effect and 5 mm midline shift to the left compared to the prior study  MRI examination of the brain  Assessment: 65 year old woman with known brain metastases to the right temporal and right frontal area from primary breast cancer, presenting with altered mental status as well as 2 witnessed seizures in the emergency room and continues to remain confused and encephalopathic. Concern for ongoing status epilepticus.  Imaging concerning for progression of both the right frontal and right temporal hemorrhagic mets. Needs emergent transfer to [facility] for further EEG monitoring.  Impression: Known brain metastases Altered mental status Multiple seizures-concern for status epilepticus  Recommendations: -Started on dexamethasone 4 mg IV every 6 hours -Was loaded with 1 g Keppra IV-recommended additional 1 g load followed by 1 g twice daily. -I will recommend Ativan for any seizure lasting greater than 5 minutes -Continuous EEG as soon as possible-emergent transfer to [facility].  We will hook her up with MRI safe leads so that the MRI is not delayed -I would also give her a load of fosphenytoin 20 mg/kg phenytoin equivalents followed by Dilantin 100 mg 3 times daily.  Appreciate pharmacy assistance and monitoring -MRI brain with without contrast when able to   Discussed my plan with Dr. Marylyn Ishihara  -- Amie Portland, MD Neurologist [facility] Pager: [phone]  CRITICAL CARE ATTESTATION Performed by: Amie Portland, MD Total critical care time: 44  minutes Critical care time was exclusive of separately billable procedures and treating other patients and/or supervising APPs/Residents/Students Critical care was necessary to treat or prevent imminent or life-threatening deterioration due to status epilepticus, brain mets  This patient is critically ill and at significant risk for neurological worsening and/or death and care requires constant monitoring. Critical care was time spent personally by me on the following activities: development of treatment plan with patient and/or surrogate as well as nursing, discussions with consultants, evaluation of patient's response to treatment, examination of patient, obtaining history from patient or surrogate, ordering and performing treatments and interventions, ordering and review of laboratory studies, ordering and review of radiographic studies, pulse oximetry, re-evaluation of patient's condition, participation in multidisciplinary rounds and medical decision making of high complexity in the care of this patient.

## 2020-11-16 NOTE — ED Notes (Signed)
pt had a seizure, partially witnessed. VS stable pt postictal physician notified

## 2020-11-16 NOTE — ED Triage Notes (Signed)
Pt husband states she has been altered since Friday. He attempted to get her to hospital since then but refused and "he had to work today and no one to keep her". Hx of breast CA. Current tx and bilateral mastectomy.

## 2020-11-16 NOTE — Progress Notes (Signed)
MRI currently shutting down due to [place], pt will be attempted at a later time today.

## 2020-11-16 NOTE — Progress Notes (Signed)
Right lower extremity venous study completed.     Please see CV Proc for preliminary results.   Bow Buntyn, RVT  

## 2020-11-16 NOTE — H&P (Addendum)
History and Physical    [name] ZMO:[id number] DOB: 1955/11/16 DOA: 12/02/2020  PCP: Patient, No Pcp Per  Patient coming from: Home  Chief Complaint: BLE weakness.  HPI: [name] is a 65 y.o. female with medical history significant of breast CA w/ brain mets. Hx from daughter. She reports that the patient was in her normal state of health 3 days ago. 2 days ago, family noted that the patient was mobile throughout the day. She stayed in bed all day. When pressed to move around the house, they found that she was having difficulty walking. Her symptoms progressed through today, where is now unable to walk and is confused. Family became concerned and had the pt brought to the ED.   ED Course: CTH was essentially stable from the previous MRI brain on 11/13/20. She was found to be somewhat confused. EDP spoke with neuro-onc and neurosurgery. Rec'd spinal MRI series. TRH was called for admission.   Review of Systems:  Unable to obtain d/t mentation.  PMHx Past Medical History:  Diagnosis Date   Breast cancer ([place])    DVT (deep venous thrombosis) ([facility]) 10/07/2011   Fever 02/06/2018   Hypertension    Radiation 11/29/2005-12/29/2005   right supraclavicular area 4147 cGy   Radiation 06/26/02-08/14/02   right breast 5040 cGy, tumor bed boosted to 1260 cGy   Rheumatic fever     PSHx Past Surgical History:  Procedure Laterality Date   BREAST SURGERY     MASTECTOMY Bilateral    PORT A CATH REVISION      SocHx  reports that she has quit smoking. She has never used smokeless tobacco. She reports that she does not drink alcohol and does not use drugs.  Allergies  Allergen Reactions   Tramadol Nausea Only   Hydrocodone-Acetaminophen Itching and Rash   Pork-Derived Products Other (See Comments)    Pt says she just doesn't eat pork; has no reaction to pork products    FamHx Family History  Problem Relation Age of Onset   Pancreatic cancer Mother    Kidney cancer Sister  21   Breast cancer Sister 18   Breast cancer Other 74    Prior to Admission medications   Medication Sig Start Date End Date Taking? Authorizing Provider  tucatinib (TUKYSA) 50 MG tablet Take 3 tablets (150 mg total) by mouth every morning. Take every 12 hr at the same time each day with or without a meal. 11/11/2020   [name], [name], MD  acetaminophen (TYLENOL) 325 MG tablet Take 2 tablets (650 mg total) by mouth every 6 (six) hours as needed for mild pain (or Fever >/= 101). 03/24/20   Georgette Shell, MD  allopurinol (ZYLOPRIM) 300 MG tablet TAKE ONE TABLET BY MOUTH EVERY DAY 10/12/20   [name], [name], MD  ALPRAZolam Duanne Moron) 1 MG tablet Take 1 mg by mouth daily as needed for anxiety. Patient not taking: Reported on 09/18/2020    [provider]  dexamethasone (DECADRON) 4 MG tablet Take 1 tablet (4 mg total) by mouth daily. 10/07/20   Ventura Sellers, MD  iron polysaccharides (NIFEREX) 150 MG capsule Take 1 capsule (150 mg total) by mouth daily. 03/02/20   Sheikh, Omair Latif, DO  levETIRAcetam (KEPPRA) 750 MG tablet TAKE 1 TABLET BY MOUTH 2 TIMES DAILY. 10/28/20   [name], [name], MD  methadone (DOLOPHINE) 10 MG tablet Take 10 mg by mouth in the morning, at noon, and at bedtime.  Patient not taking: Reported on  09/18/2020 08/08/20   [provider]  Multiple Vitamin (MULTIVITAMIN) capsule Take 1 capsule by mouth daily.      [provider]  mupirocin ointment (BACTROBAN) 2 % Place 1 application into the nose 2 (two) times daily. Patient not taking: Reported on 06/12/2020 03/24/20   Georgette Shell, MD  [facility] 4 MG/0.1ML LIQD nasal spray kit 1 spray as directed. Patient not taking: Reported on 09/18/2020 07/28/20   [provider]  omeprazole (PRILOSEC) 40 MG capsule Take 1 capsule (40 mg total) by mouth daily. Patient not taking: Reported on 06/12/2020 02/04/20   [name], [name], MD  ondansetron (ZOFRAN) 4 MG tablet Take 1 tablet (4 mg  total) by mouth 3 (three) times daily as needed. 10/07/20   [name], [name], MD  Potassium Chloride ER 20 MEQ TBCR Take 1 tablet by mouth daily. 10/27/20   [name], [name], MD  prochlorperazine (COMPAZINE) 10 MG tablet Take 1 tablet (10 mg total) by mouth every 6 (six) hours as needed for nausea or vomiting. 10/12/20   [name], [name], MD  traZODone (DESYREL) 50 MG tablet Take 25-50 mg by mouth at bedtime. Patient not taking: Reported on 09/18/2020    [provider]  triamterene-hydrochlorothiazide (MAXZIDE-25) 37.5-25 MG tablet Take 1 tablet by mouth daily. 10/28/20   [name], [name], MD  tucatinib (TUKYSA) 50 MG tablet Take 3 tablets (150 mg total) by mouth in the morning. Take as directed by MD. 09/03/20   [name], [name], MD    Physical Exam: Vitals:   11/18/2020 2202 11/29/2020 0939 12/01/2020 0942 11/08/2020 1100  BP:  128/72  (!) 144/73  Pulse:  (!) 55  (!) 58  Resp:  (!) 21  (!) 26  Temp:  98.6 F (37 C) 98.6 F (37 C)   TempSrc:  Oral Oral   SpO2:  100%  100%  Weight: 72.6 kg     Height: _0  (1.676 m)       General: 65 y.o. female resting in bed in NAD Eyes: PERRL, normal sclera ENMT: Nares patent w/o discharge, orophaynx clear, dentition normal, ears w/o discharge/lesions/ulcers Neck: Supple, trachea midline Cardiovascular: brady,  +S1, S2, no m/g/r, equal pulses throughout Respiratory: soft exp wheeze, no w/r, normal WOB on RA GI: BS+, NDNT, no masses noted, no organomegaly noted MSK: No c/c; RLE edema Skin: No rashes, bruises, ulcerations noted Neuro: A&O x 3, L > R weakness in BUE and BLE Psyc: flat affect, calm, confused, cooperative  Labs on Admission: I have personally reviewed following labs and imaging studies  CBC: Recent Labs  Lab 11/09/2020 1000  WBC 9.0  NEUTROABS 6.8  HGB 10.4*  HCT 32.8*  MCV 90.9  PLT 542   Basic Metabolic Panel: Recent Labs  Lab 11/30/2020 1000  NA 142  K 3.3*  CL 109  CO2 23  GLUCOSE 98  BUN 25*   CREATININE 0.90  CALCIUM 8.2*   GFR: Estimated Creatinine Clearance: 63.6 mL/min (by C-G formula based on SCr of 0.9 mg/dL). Liver Function Tests: Recent Labs  Lab 11/15/2020 1000  AST 22  ALT 18  ALKPHOS 49  BILITOT 0.6  PROT 5.8*  ALBUMIN 3.3*   No results for input(s): LIPASE, AMYLASE in the last 168 hours. No results for input(s): AMMONIA in the last 168 hours. Coagulation Profile: No results for input(s): INR, PROTIME in the last 168 hours. Cardiac Enzymes: No results for input(s): CKTOTAL, CKMB, CKMBINDEX, TROPONINI in the last 168 hours. BNP (last  3 results) No results for input(s): PROBNP in the last 8760 hours. HbA1C: No results for input(s): HGBA1C in the last 72 hours. CBG: No results for input(s): GLUCAP in the last 168 hours. Lipid Profile: No results for input(s): CHOL, HDL, LDLCALC, TRIG, CHOLHDL, LDLDIRECT in the last 72 hours. Thyroid Function Tests: No results for input(s): TSH, T4TOTAL, FREET4, T3FREE, THYROIDAB in the last 72 hours. Anemia Panel: No results for input(s): VITAMINB12, FOLATE, FERRITIN, TIBC, IRON, RETICCTPCT in the last 72 hours. Urine analysis:    Component Value Date/Time   COLORURINE YELLOW 11/13/2020 1025   APPEARANCEUR CLEAR 11/23/2020 1025   LABSPEC 1.034 (H) 12/03/2020 1025   PHURINE 5.0 12/07/2020 1025   GLUCOSEU NEGATIVE 12/07/2020 1025   GLUCOSEU NEGATIVE 06/12/2020 1115   HGBUR NEGATIVE 11/15/2020 1025   BILIRUBINUR NEGATIVE 11/26/2020 1025   KETONESUR 5 (A) 11/21/2020 1025   PROTEINUR NEGATIVE 11/09/2020 1025   UROBILINOGEN 0.2 06/12/2020 1115   NITRITE NEGATIVE 11/28/2020 1025   LEUKOCYTESUR NEGATIVE 12/03/2020 1025    Radiological Exams on Admission: CT Head Wo Contrast  Result Date: 11/24/2020 CLINICAL DATA:  Dizziness, non-specific Mental status change, unknown cause weakness EXAM: CT HEAD WITHOUT CONTRAST TECHNIQUE: Contiguous axial images were obtained from the base of the skull through the vertex without  intravenous contrast. COMPARISON:  11/13/2020 and prior. FINDINGS: Brain: Heterogenous right frontal and right temporal masses are better demonstrated on recent MRI. Redemonstration of right cerebral edema. Confluent left cerebral hypodense foci involving the supratentorial white matter. Leftward midline shift of 5 mm, unchanged. No ventriculomegaly or extra-axial fluid collection. Vascular: No hyperdense vessel or unexpected calcification. Skull: Negative for fracture or focal lesion. Sinuses/Orbits: Normal orbits. Clear paranasal sinuses. No mastoid effusion. Other: None. IMPRESSION: Grossly unchanged appearance of hemorrhagic right cerebral metastases, perilesional edema and 5 mm leftward midline shift. No acute intracranial process. Electronically Signed   By: Primitivo Gauze [name]   On: 11/23/2020 11:18   DG Chest Port 1 View  Result Date: 11/30/2020 CLINICAL DATA:  Weakness today.  History of breast cancer. EXAM: PORTABLE CHEST 1 VIEW COMPARISON:  Single-view of the chest 08/16/2020. FINDINGS: The lungs are clear. Heart size is normal. No pneumothorax or pleural effusion. Port-A-Cath and surgical clips in the right axilla are unchanged. No acute bony abnormality. IMPRESSION: No acute disease. Electronically Signed   By: Inge Rise [name]   On: 11/23/2020 10:17    EKG: Independently reviewed. Sinus brady, no st elevations  Assessment/Plan Breast CA w/ mets to the brain AMS BLE weakness     - admit to inpt, progressive     - neuro-onc, neurology to see     - steroids IV, keppra IV     - MRI spine for met screen  RLE swelling     - venous doppler  Hypokalemia     - replace K+, check Mg2+  HTN     - PRN hydralazine     - resume home meds when med rec'd  Normocytic anemia     - stable, no signs of bleed  UPDATE: multiple witnessed seizures. Loaded 2g keppra and started on 1g BID. Neuro consulted. Move to [facility] for continuous EEG. Neuro following.  DVT prophylaxis: SCD to left  leg  Code Status: DNR  Family Communication: W/ dtr by phone  Consults called: EDP spoke with neuroonc and neurosurgery   Status is: Inpatient  Remains inpatient appropriate because:Inpatient level of care appropriate due to severity of illness   Dispo: The patient is  from: Home              Anticipated d/c is to: Home              Anticipated d/c date is: 2 days              Patient currently is not medically stable to d/c.   Jonnie Finner DO [facility]  If 7PM-7AM, please contact night-coverage [email or web address]  11/22/2020, 12:29 PM

## 2020-11-16 NOTE — ED Notes (Signed)
Attempted to call report for pt transfer x 2 at [phone] Awaiting return call.

## 2020-11-16 NOTE — Progress Notes (Signed)
Patient arrived from [facility] ED on a stretcher via [facility], assessment completed see flowsheet, placed on tele ccmd notified, MD notified of HR rate occassionally in the 30's, bed in lowest position, call bell within reach, bed rails padded, will continue to monitor.

## 2020-11-16 NOTE — Progress Notes (Signed)
Failed attempt at MRI. Pt has altered mental status, unable to answer surgical history in order to be safely cleared. Will attempt to call family, MD unable to reach family VIA phone, no answer. Spoke with RN, patient will be transferred to [facility].

## 2020-11-16 NOTE — ED Notes (Signed)
Report called to [employer] [facility] [employer]

## 2020-11-16 NOTE — ED Notes (Signed)
  [place]( Daughter ) [phone]  Son telephone number - [phone]

## 2020-11-16 NOTE — ED Provider Notes (Signed)
[facility] DEPT Provider Note   CSN: [id number] Arrival date & time: 11/28/2020  0930     History Chief Complaint  Patient presents with  . Altered Mental Status    [name] is a 64 y.o. female history of breast cancer with mets to the brain, DVT not on anticoagulation, here presenting with altered mental status and weakness and trouble walking.  Patient has known mets to the brain and is undergoing radiation.  However she missed several appointments.  She had an MRI done 3 days ago that showed increased mass and vasogenic edema with mets.  Over the weekend, patient became progressively weak to the point that she is unable to walk.  She states that her lower right leg is more swollen.  Denies any chest pain or shortness of breath.  Patient did not know the results yet for scan and was supposed to go see oncology today to discuss treatment options.   The history is provided by the patient.       Past Medical History:  Diagnosis Date  . Breast cancer ([place])   . DVT (deep venous thrombosis) ([place]) 10/07/2011  . Fever 02/06/2018  . Hypertension   . Radiation 11/29/2005-12/29/2005   right supraclavicular area 4147 cGy  . Radiation 06/26/02-08/14/02   right breast 5040 cGy, tumor bed boosted to 1260 cGy  . Rheumatic fever     Patient Active Problem List   Diagnosis Date Noted  . Seizure disorder, focal motor ([place]) 07/22/2020  . Hypomagnesemia 07/22/2020  . Cerebral edema ([place]) 07/22/2020  . Depression with anxiety 06/26/2020  . Substance abuse ([place]) 06/12/2020  . Urinary frequency 06/12/2020  . Other insomnia 06/12/2020  . Cancer of right breast metastatic to brain ([place]) 03/16/2020  . Nausea & vomiting 02/29/2020  . UTI (urinary tract infection) 02/11/2020  . Acute metabolic encephalopathy 72/53/6644  . Generalized weakness 02/11/2020  . Hypokalemia 02/11/2020  . Paresis of left lower extremity ([place]) 02/11/2020  . Paresis of right lower extremity  ([place]) 02/11/2020  . Drug-induced polyneuropathy ([place]) 12/19/2019  . Acute respiratory failure with hypoxia ([place]) 11/19/2019  . COVID-19 virus infection 11/19/2019  . Overdose opiate, accidental or unintentional, initial encounter ([place]) 11/19/2019  . Acute encephalopathy 11/19/2019  . Metastasis to brain ([place]) 09/13/2019  . Acute lower UTI 02/06/2018  . Altered mental status 02/06/2018  . Bacteremia 02/06/2018  . Polypharmacy 02/06/2018  . Goals of care, counseling/discussion 09/05/2017  . Pneumonia 01/25/2017  . CAP (community acquired pneumonia) 01/23/2017  . Port catheter in place 05/30/2016  . 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 Chronic pain 04/13/2015  . Left arm pain 08/18/2014  . Malignant neoplasm of upper-inner quadrant of right breast in female, estrogen receptor positive ([place]) 10/03/2013  . Post-lymphadenectomy lymphedema of arm 08/15/2013  . Port or reservoir infection 01/29/2013  . DVT (deep venous thrombosis) ([place]) 10/07/2011  . Chest pain 09/10/2009  . Secondary cardiomyopathy ([place]) 09/02/2009  . Essential hypertension 02/26/2009  . GERD 02/26/2009    Past Surgical History:  Procedure Laterality Date  . BREAST SURGERY    . MASTECTOMY Bilateral   . PORT A CATH REVISION       OB History   No obstetric history on file.     Family History  Problem Relation Age of Onset  . Pancreatic cancer Mother   . Kidney cancer Sister 92  . Breast cancer Sister 63  . Breast cancer Other 60    Social History  Tobacco Use  . Smoking status: Former Research scientist (life sciences)  . Smokeless tobacco: Never Used  Vaping Use  . Vaping Use: Never used  Substance Use Topics  . Alcohol use: Never    Comment: Rarely  . Drug use: No    Home Medications Prior to Admission medications   Medication Sig Start Date End Date Taking? Authorizing Provider  tucatinib (TUKYSA) 50 MG tablet Take 3 tablets (150 mg total) by mouth every morning. Take every 12 hr at the  same time each day with or without a meal. 12/05/2020   [name], [name], MD  acetaminophen (TYLENOL) 325 MG tablet Take 2 tablets (650 mg total) by mouth every 6 (six) hours as needed for mild pain (or Fever >/= 101). 03/24/20   Georgette Shell, MD  allopurinol (ZYLOPRIM) 300 MG tablet TAKE ONE TABLET BY MOUTH EVERY DAY 10/12/20   [name], [name], MD  ALPRAZolam Duanne Moron) 1 MG tablet Take 1 mg by mouth daily as needed for anxiety. Patient not taking: Reported on 09/18/2020    [provider]  dexamethasone (DECADRON) 4 MG tablet Take 1 tablet (4 mg total) by mouth daily. 10/07/20   Ventura Sellers, MD  iron polysaccharides (NIFEREX) 150 MG capsule Take 1 capsule (150 mg total) by mouth daily. 03/02/20   Sheikh, Omair Latif, DO  levETIRAcetam (KEPPRA) 750 MG tablet TAKE 1 TABLET BY MOUTH 2 TIMES DAILY. 10/28/20   [name], [name], MD  methadone (DOLOPHINE) 10 MG tablet Take 10 mg by mouth in the morning, at noon, and at bedtime.  Patient not taking: Reported on 09/18/2020 08/08/20   [provider]  Multiple Vitamin (MULTIVITAMIN) capsule Take 1 capsule by mouth daily.      [provider]  mupirocin ointment (BACTROBAN) 2 % Place 1 application into the nose 2 (two) times daily. Patient not taking: Reported on 06/12/2020 03/24/20   Georgette Shell, MD  [facility] 4 MG/0.1ML LIQD nasal spray kit 1 spray as directed. Patient not taking: Reported on 09/18/2020 07/28/20   [provider]  omeprazole (PRILOSEC) 40 MG capsule Take 1 capsule (40 mg total) by mouth daily. Patient not taking: Reported on 06/12/2020 02/04/20   [name], [name], MD  ondansetron (ZOFRAN) 4 MG tablet Take 1 tablet (4 mg total) by mouth 3 (three) times daily as needed. 10/07/20   [name], [name], MD  Potassium Chloride ER 20 MEQ TBCR Take 1 tablet by mouth daily. 10/27/20   [name], [name], MD  prochlorperazine (COMPAZINE) 10 MG tablet Take 1 tablet (10 mg total) by mouth every 6 (six)  hours as needed for nausea or vomiting. 10/12/20   [name], [name], MD  traZODone (DESYREL) 50 MG tablet Take 25-50 mg by mouth at bedtime. Patient not taking: Reported on 09/18/2020    [provider]  triamterene-hydrochlorothiazide (MAXZIDE-25) 37.5-25 MG tablet Take 1 tablet by mouth daily. 10/28/20   [name], [name], MD  tucatinib (TUKYSA) 50 MG tablet Take 3 tablets (150 mg total) by mouth in the morning. Take as directed by MD. 09/03/20   [name], [name], MD    Allergies    Tramadol, Hydrocodone-acetaminophen, and Pork-derived products  Review of Systems   Review of Systems  Neurological: Positive for weakness.  All other systems reviewed and are negative.   Physical Exam Updated Vital Signs BP (!) 144/73   Pulse (!) 58   Temp 98.6 F (37 C) (Oral)   Resp (!) 26   Ht '5\' 6"'  (  1.676 m)   Wt 72.6 kg   SpO2 100%   BMI 25.83 kg/m   Physical Exam Vitals and nursing note reviewed.  Constitutional:      Comments: Chronically ill-appearing.  HENT:     Head: Normocephalic.     Nose: Nose normal.     Mouth/Throat:     Mouth: Mucous membranes are moist.  Eyes:     Extraocular Movements: Extraocular movements intact.     Pupils: Pupils are equal, round, and reactive to light.  Cardiovascular:     Rate and Rhythm: Normal rate and regular rhythm.     Pulses: Normal pulses.     Heart sounds: Normal heart sounds.  Pulmonary:     Effort: Pulmonary effort is normal.     Breath sounds: Normal breath sounds.  Abdominal:     General: Abdomen is flat.     Palpations: Abdomen is soft.  Musculoskeletal:     Cervical back: Normal range of motion and neck supple.     Comments: Right leg 2+ edema.   Skin:    General: Skin is warm.     Capillary Refill: Capillary refill takes less than 2 seconds.     Coloration: Skin is not jaundiced.  Neurological:     Comments: ANO x2 and slightly confused.  No obvious facial droop.  Cranial nerves II to XII intact.  Patient  does have strength of 4 out of 5 on the right leg and 5 out of 5 the left leg.  Unable to sit patient up or ambulate patient due to weakness   Psychiatric:        Mood and Affect: Mood normal.     ED Results / Procedures / Treatments   Labs (all labs ordered are listed, but only abnormal results are displayed) Labs Reviewed  CBC WITH DIFFERENTIAL/PLATELET - Abnormal; Notable for the following components:      Result Value   RBC 3.61 (*)    Hemoglobin 10.4 (*)    HCT 32.8 (*)    RDW 16.6 (*)    Abs Immature Granulocytes 0.10 (*)    All other components within normal limits  COMPREHENSIVE METABOLIC PANEL - Abnormal; Notable for the following components:   Potassium 3.3 (*)    BUN 25 (*)    Calcium 8.2 (*)    Total Protein 5.8 (*)    Albumin 3.3 (*)    All other components within normal limits  URINALYSIS, ROUTINE W REFLEX MICROSCOPIC - Abnormal; Notable for the following components:   Specific Gravity, Urine 1.034 (*)    Ketones, ur 5 (*)    All other components within normal limits  RESP PANEL BY RT-PCR (FLU A&B, COVID) ARPGX2  URINE CULTURE  BRAIN NATRIURETIC PEPTIDE  TROPONIN I (HIGH SENSITIVITY)  TROPONIN I (HIGH SENSITIVITY)    EKG EKG Interpretation  Date/Time:  Monday November 16 2020 09:38:59 EST Ventricular Rate:  58 PR Interval:    QRS Duration: 101 QT Interval:  423 QTC Calculation: 416 R Axis:   37 Text Interpretation: Sinus rhythm Consider left atrial enlargement Abnormal R-wave progression, early transition Left ventricular hypertrophy Borderline T abnormalities, inferior leads No significant change since last tracing Confirmed by Wandra Arthurs [phone]) on 11/13/2020 10:19:03 AM   Radiology CT Head Wo Contrast  Result Date: 12/03/2020 CLINICAL DATA:  Dizziness, non-specific Mental status change, unknown cause weakness EXAM: CT HEAD WITHOUT CONTRAST TECHNIQUE: Contiguous axial images were obtained from the base of the skull through the vertex  without  intravenous contrast. COMPARISON:  11/13/2020 and prior. FINDINGS: Brain: Heterogenous right frontal and right temporal masses are better demonstrated on recent MRI. Redemonstration of right cerebral edema. Confluent left cerebral hypodense foci involving the supratentorial white matter. Leftward midline shift of 5 mm, unchanged. No ventriculomegaly or extra-axial fluid collection. Vascular: No hyperdense vessel or unexpected calcification. Skull: Negative for fracture or focal lesion. Sinuses/Orbits: Normal orbits. Clear paranasal sinuses. No mastoid effusion. Other: None. IMPRESSION: Grossly unchanged appearance of hemorrhagic right cerebral metastases, perilesional edema and 5 mm leftward midline shift. No acute intracranial process. Electronically Signed   By: Primitivo Gauze [name]   On: 11/10/2020 11:18   DG Chest Port 1 View  Result Date: 11/29/2020 CLINICAL DATA:  Weakness today.  History of breast cancer. EXAM: PORTABLE CHEST 1 VIEW COMPARISON:  Single-view of the chest 08/16/2020. FINDINGS: The lungs are clear. Heart size is normal. No pneumothorax or pleural effusion. Port-A-Cath and surgical clips in the right axilla are unchanged. No acute bony abnormality. IMPRESSION: No acute disease. Electronically Signed   By: Inge Rise [name]   On: 11/22/2020 10:17    Procedures Procedures (including critical care time)  CRITICAL CARE Performed by: Wandra Arthurs   Total critical care time: 40 minutes  Critical care time was exclusive of separately billable procedures and treating other patients.  Critical care was necessary to treat or prevent imminent or life-threatening deterioration.  Critical care was time spent personally by me on the following activities: development of treatment plan with patient and/or surrogate as well as nursing, discussions with consultants, evaluation of patient's response to treatment, examination of patient, obtaining history from patient or surrogate,  ordering and performing treatments and interventions, ordering and review of laboratory studies, ordering and review of radiographic studies, pulse oximetry and re-evaluation of patient's condition.   Medications Ordered in ED Medications  dexamethasone (DECADRON) injection 4 mg (has no administration in time range)    ED Course  I have reviewed the triage vital signs and the nursing notes.  Pertinent labs & imaging results that were available during my care of the patient were reviewed by me and considered in my medical decision making (see chart for details).    MDM Rules/Calculators/A&P                         Cylee Dattilo is a 65 y.o. female who presented with weakness and inability to walk.  I reviewed her MRI results from 3 days ago.  Patient has a large temporal mass with vasogenic edema.  I think this is likely causing her weakness.  I discussed case with Dr. [name] from neuro-oncology.  He states that he actually discussed the case with Dr. Marcello Moores from neurosurgery this morning at tumor board.  She may be candidate for radical craniotomy but patient's compliance may be an issue.  We recommend that I start her on IV steroids and discussed case with neurosurgery for formal consult  11:39 AM Discussed case with Dr. Ronnald Ramp from neurosurgery.  He states that the right leg weakness does not make sense given she has right temporal edema from her mass.  He is concerned for likely a spinal process.  Labs unremarkable.  Patient was given IV steroids.  Neuro oncology recommended 4 mg of Decadron 4 times daily.  Patient will need to be admitted given her weakness.  Oncology and neurosurgery to see patient as consult.  They do recommend a whole spine MRI to  rule out mets to the spine   Final Clinical Impression(s) / ED Diagnoses Final diagnoses:  None    Rx / DC Orders ED Discharge Orders    None       Drenda Freeze, MD 11/28/2020 1141

## 2020-11-16 NOTE — Progress Notes (Signed)
Phenytoin Initial Consult Indication: Malignant seizures  Allergies  Allergen Reactions  . Tramadol Nausea Only  . Hydrocodone-Acetaminophen Itching and Rash  . Pork-Derived Products Other (See Comments)    Pt says she just doesn't eat pork; has no reaction to pork products    Patient Measurements: Height: 5\' 6"  (167.6 cm) Weight: 72.6 kg (160 lb 0.9 oz) IBW/kg (Calculated) : 59.3 TPN AdjBW (KG): 72.6 Body mass index is 25.83 kg/m.   Vital signs: Temp: 98.6 F (37 C) (01/10 0942) Temp Source: Oral (01/10 0942) BP: 141/92 (01/10 1530) Pulse Rate: 86 (01/10 1530)  Labs: Lab Results  Component Value Date/Time   Albumin 3.3 (L) 11/10/2020 1000   No results found for: PHENYTOIN, PHENOBARB, VALPROATE, CBMZ Estimated Creatinine Clearance: 63.6 mL/min (by C-G formula based on SCr of 0.9 mg/dL).   Medications:  (Not in a hospital admission)  Scheduled:  . dexamethasone (DECADRON) injection  4 mg Intravenous Q6H  . LORazepam  1 mg Intravenous Once  . [START ON 11/17/2020] phenytoin (DILANTIN) IV  100 mg Intravenous Q8H   Infusions:  . fosPHENYtoin (CEREBYX) IV    . levETIRAcetam Stopped (11/15/2020 1612)  . potassium chloride 10 mEq (12/03/2020 1824)    Assessment: 83 yoF with PMH breast CA with mets to brain, noted to have progressive difficulty with movement x 2 days. Noted to have 2 witnessed seizures in ED. CT/MRI show large R-sided hemorrhagic temporal and cystic frontal metastases with accompanying 5 mm midline left shift, significantly progressed from prior imaging. Patient has previously been on Keppra and Decadron during the past few years, but naive to phenytoin. Fosphenytoin/Phenytoin initiated by Neurology with Pharmacy to monitor. Plan transfer to [facility] for further neuro workup.  Corrected phenytoin level (if needed): pending; new start Seizure activity: see above Significant potential drug interactions: may induce clearance of dexamethasone, reducing  effectiveness  Goals of care:  Total phenytoin level: 10-20 mcg/ml Free phenytoin level: 1-2 mcg/ml  Plan:   Per Neurology:  20 mg/kg PE equivalents of Fosphenytoin IV x 1  Phenytoin 100 mg IV q8h  Keppra 1g + 1g IV load, followed by 1g IV q12  Dexamethasone 4 mg IV q6 hr  Check 2-hr phenytoin level and adjust as appropriate   Anticipated maintenance dose: 300-400 mg/d  Pharmacy will continue to follow regarding obtaining total phenytoin levels and dose adjustments as indicated.   Yumi Insalaco A 11/23/2020,6:38 PM

## 2020-11-17 ENCOUNTER — Inpatient Hospital Stay (HOSPITAL_COMMUNITY): Payer: Medicare Other

## 2020-11-17 ENCOUNTER — Other Ambulatory Visit: Payer: Self-pay | Admitting: Pharmacist

## 2020-11-17 DIAGNOSIS — G893 Neoplasm related pain (acute) (chronic): Secondary | ICD-10-CM

## 2020-11-17 DIAGNOSIS — R569 Unspecified convulsions: Secondary | ICD-10-CM

## 2020-11-17 DIAGNOSIS — C50211 Malignant neoplasm of upper-inner quadrant of right female breast: Secondary | ICD-10-CM

## 2020-11-17 DIAGNOSIS — C7931 Secondary malignant neoplasm of brain: Secondary | ICD-10-CM | POA: Diagnosis not present

## 2020-11-17 DIAGNOSIS — G40901 Epilepsy, unspecified, not intractable, with status epilepticus: Secondary | ICD-10-CM

## 2020-11-17 DIAGNOSIS — Z170 Estrogen receptor positive status [ER+]: Secondary | ICD-10-CM

## 2020-11-17 DIAGNOSIS — C50911 Malignant neoplasm of unspecified site of right female breast: Secondary | ICD-10-CM

## 2020-11-17 LAB — COMPREHENSIVE METABOLIC PANEL
ALT: 20 U/L (ref 0–44)
AST: 24 U/L (ref 15–41)
Albumin: 3.4 g/dL — ABNORMAL LOW (ref 3.5–5.0)
Alkaline Phosphatase: 59 U/L (ref 38–126)
Anion gap: 12 (ref 5–15)
BUN: 13 mg/dL (ref 8–23)
CO2: 23 mmol/L (ref 22–32)
Calcium: 9.2 mg/dL (ref 8.9–10.3)
Chloride: 106 mmol/L (ref 98–111)
Creatinine, Ser: 0.8 mg/dL (ref 0.44–1.00)
GFR, Estimated: >60 mL/min (ref >60)
Glucose, Bld: 138 mg/dL — ABNORMAL HIGH (ref 70–99)
Potassium: 3.7 mmol/L (ref 3.5–5.1)
Sodium: 141 mmol/L (ref 135–145)
Total Bilirubin: 0.7 mg/dL (ref 0.3–1.2)
Total Protein: 6.2 g/dL — ABNORMAL LOW (ref 6.5–8.1)

## 2020-11-17 LAB — CBC
HCT: 35.3 % — ABNORMAL LOW (ref 36.0–46.0)
Hemoglobin: 11.7 g/dL — ABNORMAL LOW (ref 12.0–15.0)
MCH: 29 pg (ref 26.0–34.0)
MCHC: 33.1 g/dL (ref 30.0–36.0)
MCV: 87.4 fL (ref 80.0–100.0)
Platelets: 271 10*3/uL (ref 150–400)
RBC: 4.04 MIL/uL (ref 3.87–5.11)
RDW: 16.2 % — ABNORMAL HIGH (ref 11.5–15.5)
WBC: 10.7 10*3/uL — ABNORMAL HIGH (ref 4.0–10.5)
nRBC: 0 % (ref 0.0–0.2)

## 2020-11-17 LAB — PHENOBARBITAL LEVEL: Phenobarbital: 33 ug/mL — ABNORMAL HIGH (ref 15.0–30.0)

## 2020-11-17 LAB — URINE CULTURE: Culture: NO GROWTH

## 2020-11-17 LAB — PHOSPHORUS: Phosphorus: 2.9 mg/dL (ref 2.5–4.6)

## 2020-11-17 LAB — PHENYTOIN LEVEL, TOTAL: Phenytoin Lvl: 19.9 ug/mL (ref 10.0–20.0)

## 2020-11-17 MED ORDER — LORAZEPAM 2 MG/ML IJ SOLN: 1.0000 mg | Freq: Once | INTRAMUSCULAR | Status: AC

## 2020-11-17 MED ORDER — SODIUM CHLORIDE 0.9 % IV SOLN
1500.0000 mg | Freq: Once | INTRAVENOUS | Status: AC
  Administered 2020-11-17: 1500 mg via INTRAVENOUS
  Filled 2020-11-17: qty 11.54

## 2020-11-17 MED ORDER — TUCATINIB 50 MG PO TABS: 150.0000 mg | ORAL_TABLET | Freq: Every day | ORAL | 6 refills | Status: DC

## 2020-11-17 MED ORDER — PHENOBARBITAL SODIUM 65 MG/ML IJ SOLN: 20.0000 mg/kg | Freq: Once | INTRAMUSCULAR | Status: DC

## 2020-11-17 MED ORDER — PHENOBARBITAL SODIUM 65 MG/ML IJ SOLN
65.0000 mg | Freq: Two times a day (BID) | INTRAMUSCULAR | Status: DC
  Administered 2020-11-17 – 2020-11-19 (×4): 65 mg via INTRAVENOUS
  Filled 2020-11-17 (×5): qty 1

## 2020-11-17 MED ORDER — LEVETIRACETAM IN NACL 1000 MG/100ML IV SOLN
1000.0000 mg | INTRAVENOUS | Status: AC
  Administered 2020-11-17: 1000 mg via INTRAVENOUS
  Filled 2020-11-17: qty 100

## 2020-11-17 MED ORDER — LEVETIRACETAM IN NACL 1500 MG/100ML IV SOLN
1500.0000 mg | Freq: Two times a day (BID) | INTRAVENOUS | Status: DC
  Administered 2020-11-17 – 2020-11-19 (×4): 1500 mg via INTRAVENOUS
  Filled 2020-11-17 (×6): qty 100

## 2020-11-17 MED FILL — TUKYSA 50 MG TABS: 50 | 20 days supply | Qty: 60 | Fill #0

## 2020-11-17 NOTE — Procedures (Incomplete)
Patient Name: Yoshi Vicencio  MRN: [id number]  Epilepsy Attending: Lora Havens  Referring Physician/Provider: Dr Lesleigh Noe Duration: 11/17/2020 0157 to 11/18/2020 0157  Patient history: 65 year old female with known brain metastasis presented with altered mental status.  EEG to evaluate for seizures.  Level of alertness: Awake, sleep  AEDs during EEG study: Keppra, Dilantin, Phenobarb  Technical aspects: This EEG study was done with scalp electrodes positioned according to the 10-20 International system of electrode placement. Electrical activity was acquired at a sampling rate of 500Hz  and reviewed with a high frequency filter of 70Hz  and a low frequency filter of 1Hz . EEG data were recorded continuously and digitally stored.   Description: No clear posterior dominant rhythm was seen.  Sleep was characterized by vertex waves, sleep spindles (12 to 14 Hz), maximal frontocentral region.  EEG showed continuous 3 to 6 Hz theta-delta slowing in the right fronto-centro- temporal region.  Sharp waves were noted in right fronto-centro temporal region, at times periodic at 0.5 to 1 Hz.    Event button was pressed multiple times on 11/17/2020 between 0803 to 0838 during which patient was noted to have left gaze preference and left forward head turn.  Concomitant EEG showed sharply contoured 4 to 5 Hz theta slowing which gradually evolved into 2 to 3 Hz delta slowing in the right frontocentral temporal region. Multiple seizures without clinical signs were also noted arising from right frontocentral temporal region, avg 10/hour, lasting about 30 seconds, last seizure at 1325.  Parts of study were difficult to interpret due to significant electrode artifact between 11/11/2020 0157 to 11/17/2020 1103 and 11/17/2020 1434 to 1829.   ABNORMALITY -Status epilepticus, right fronto-centro- temporal region -Sharp waves,right fronto-centro- temporal region -Continuous slow, right fronto-centro- temporal  region  IMPRESSION: This study initially showed status epilepticus with seizures arising from right frontocentral region, average 10 seizures per hour, lasting for 30 seconds, last seizure 1325.  During some of the seizures patient was noted to have left gaze deviation with left head turn but did not have any clinical signs during most other seizures.  There was also evidence of cortical dysfunction and epileptogenicity arising from right frontocentral region suggestive of underlying structural abnormality.   Helyn Schwan [name]

## 2020-11-17 NOTE — Progress Notes (Signed)
Consult: phenytoin and AED managment Indication: Malignant seizures  Allergies  Allergen Reactions  . Tramadol Nausea Only  . Hydrocodone-Acetaminophen Itching and Rash  . Pork-Derived Products Other (See Comments)    Pt says she just doesn't eat pork; has no reaction to pork products    Patient Measurements: Height: '5\' 6"'  (167.6 cm) Weight: 74.7 kg (164 lb 10.9 oz) IBW/kg (Calculated) : 59.3 TPN AdjBW (KG): 72.6 Body mass index is 26.58 kg/m.   Vital signs: Temp: 99.1 F (37.3 C) (01/11 1200) Temp Source: Oral (01/11 1200) BP: 152/59 (01/11 1200) Pulse Rate: 67 (01/11 1200)  Labs: Lab Results  Component Value Date/Time   Albumin 3.4 (L) 11/17/2020 0500   Phenytoin Lvl 19.9 11/17/2020 0600   Lab Results  Component Value Date   PHENYTOIN 19.9 11/17/2020   Estimated Creatinine Clearance: 72.5 mL/min (by C-G formula based on SCr of 0.8 mg/dL).   Medications:  Medications Prior to Admission  Medication Sig Dispense Refill Last Dose  . cloNIDine (CATAPRES) 0.1 MG tablet Take 0.1 mg by mouth 2 (two) times daily.     . furosemide (LASIX) 20 MG tablet Take 20 mg by mouth daily.     . ondansetron (ZOFRAN-ODT) 4 MG disintegrating tablet Take 4 mg by mouth every 8 (eight) hours as needed for nausea or vomiting.     Marland Kitchen oxyCODONE (ROXICODONE) 15 MG immediate release tablet Take 15 mg by mouth every 4 (four) hours as needed for pain.     Marland Kitchen QUEtiapine (SEROQUEL) 25 MG tablet Take 25 mg by mouth at bedtime.     . sertraline (ZOLOFT) 100 MG tablet Take 100 mg by mouth daily.     . tucatinib (TUKYSA) 150 MG tablet Take 150 mg by mouth daily. Take every 12 hrs at the same time each day with or without a meal.     . acetaminophen (TYLENOL) 325 MG tablet Take 2 tablets (650 mg total) by mouth every 6 (six) hours as needed for mild pain (or Fever >/= 101).     Marland Kitchen allopurinol (ZYLOPRIM) 300 MG tablet TAKE ONE TABLET BY MOUTH EVERY DAY (Patient taking differently: Take 300 mg by mouth daily.)  90 tablet 3   . ALPRAZolam (XANAX) 1 MG tablet Take 1 mg by mouth daily as needed for anxiety. (Patient not taking: Reported on 09/18/2020)     . dexamethasone (DECADRON) 4 MG tablet Take 1 tablet (4 mg total) by mouth daily. (Patient taking differently: Take 4 mg by mouth 2 (two) times daily.) 60 tablet 1   . iron polysaccharides (NIFEREX) 150 MG capsule Take 1 capsule (150 mg total) by mouth daily. 30 capsule 0   . levETIRAcetam (KEPPRA) 750 MG tablet TAKE 1 TABLET BY MOUTH 2 TIMES DAILY. (Patient taking differently: Take 750 mg by mouth 2 (two) times daily.) 60 tablet 1   . methadone (DOLOPHINE) 10 MG tablet Take 10 mg by mouth in the morning, at noon, and at bedtime.     . Multiple Vitamin (MULTIVITAMIN) capsule Take 1 capsule by mouth daily.       . mupirocin ointment (BACTROBAN) 2 % Place 1 application into the nose 2 (two) times daily. (Patient not taking: Reported on 06/12/2020) 22 g 0   . NARCAN 4 MG/0.1ML LIQD nasal spray kit 1 spray as directed. (Patient not taking: Reported on 09/18/2020)     . omeprazole (PRILOSEC) 40 MG capsule Take 1 capsule (40 mg total) by mouth daily. (Patient not taking: Reported on 06/12/2020) 30  capsule 1   . ondansetron (ZOFRAN) 4 MG tablet Take 1 tablet (4 mg total) by mouth 3 (three) times daily as needed. 20 tablet 3   . Potassium Chloride ER 20 MEQ TBCR Take 1 tablet by mouth daily. 60 tablet 2   . prochlorperazine (COMPAZINE) 10 MG tablet Take 1 tablet (10 mg total) by mouth every 6 (six) hours as needed for nausea or vomiting. 30 tablet 1   . traZODone (DESYREL) 50 MG tablet Take 25-50 mg by mouth at bedtime. (Patient not taking: Reported on 09/18/2020)     . triamterene-hydrochlorothiazide (MAXZIDE-25) 37.5-25 MG tablet Take 1 tablet by mouth daily. 30 tablet 1    Scheduled:  . Chlorhexidine Gluconate Cloth  6 each Topical Daily  . dexamethasone (DECADRON) injection  4 mg Intravenous Q6H  . LORazepam  1 mg Intravenous Once  . PHENObarbital  65 mg  Intravenous BID  . phenytoin (DILANTIN) IV  100 mg Intravenous Q8H   Infusions:  . levETIRAcetam    . PHENObarbital 1000 mg IVPB 1,500 mg (11/17/20 1312)    Assessment: 61 yoF with PMH breast CA with mets to brain, noted to have progressive difficulty with movement x 2 days. Noted to have 2 witnessed seizures in ED. CT/MRI show large R-sided hemorrhagic temporal and cystic frontal metastases with accompanying 5 mm midline left shift, significantly progressed from prior imaging. Patient has previously been on Keppra and Decadron during the past few years, but naive to phenytoin. Fosphenytoin/Phenytoin initiated by Neurology with Pharmacy to monitor. . -fosphenytoin 55m/kg given ay 7:30pm on 1/10 -PHT level= 19.9 this am (albumin= 3.4, corrected ~ 25)  Ongoing seizures noted and phenobarbital added today (15022mload; ~ 2017mg) then 27m38m bid. Per neuro- draw a phenobarbital level 1 hour after the infusion -   Goals of care:  Total phenytoin level: 10-20 mcg/ml Phenobarbital level 10-40 mcg/ml  Plan:  -Continue phenytoin 100mg27mq8h -Will recheck a phenytoin level later this week -Check a phenobarbital level 1 hour after the infusion  AndreHildred LaserrmD Clinical Pharmacist **Pharmacist phone directory can now be found on am[facility](PW [email or web address]).  [facility]ed under [facility]

## 2020-11-17 NOTE — Hospital Course (Addendum)
65 year old woman PMH metastatic breast cancer, DVT on anticoagulation, presenting with altered mental status and progressive weakness to the point where she is unable to walk.  Recent MRI showed increased brain mass with vasogenic edema.  Neurology, neurooncology and neurosurgery were consulted.  Patient developed seizures and was transferred to [facility] for continuous EEG given concern for status epilepticus.   A & P  Status epilepticus, new seizure disorder with acute encephalopathy secondary to breast cancer metastatic to the brain with associated bilateral lower extremity weakness --Continue dexamethasone, Keppra, EEG, Dilantin as per neurology.  Avoid Vimpat secondary to intermittent bradycardia. --Phenobarbital padded as per neurology --MRI cervical lumbar and sacral spine to rule out spinal metastasis --Seizure precautions  PMH DVT? --no anticoagulation listed on med-rec  Chronic pain on methadone --hold for now as NPO

## 2020-11-17 NOTE — Progress Notes (Signed)
Phenytoin Consult Indication: Malignant seizures  Allergies  Allergen Reactions  . Tramadol Nausea Only  . Hydrocodone-Acetaminophen Itching and Rash  . Pork-Derived Products Other (See Comments)    Pt says she just doesn't eat pork; has no reaction to pork products    Patient Measurements: Height: _0  (167.6 cm) Weight: 74.7 kg (164 lb 10.9 oz) IBW/kg (Calculated) : 59.3 TPN AdjBW (KG): 72.6 Body mass index is 26.58 kg/m.   Vital signs: Temp: 98 F (36.7 C) (01/11 0348) Temp Source: Oral (01/11 0348) BP: 159/80 (01/11 0348) Pulse Rate: 69 (01/11 0348)  Labs: Lab Results  Component Value Date/Time   Albumin 3.4 (L) 11/17/2020 0500   Albumin 3.3 (L) 11/23/2020 1000   Phenytoin Lvl 19.9 11/17/2020 0600   Lab Results  Component Value Date   PHENYTOIN 19.9 11/17/2020   Estimated Creatinine Clearance: 72.5 mL/min (by C-G formula based on SCr of 0.8 mg/dL).   Medications:  Medications Prior to Admission  Medication Sig Dispense Refill Last Dose  . cloNIDine (CATAPRES) 0.1 MG tablet Take 0.1 mg by mouth 2 (two) times daily.     . furosemide (LASIX) 20 MG tablet Take 20 mg by mouth daily.     . ondansetron (ZOFRAN-ODT) 4 MG disintegrating tablet Take 4 mg by mouth every 8 (eight) hours as needed for nausea or vomiting.     Marland Kitchen oxyCODONE (ROXICODONE) 15 MG immediate release tablet Take 15 mg by mouth every 4 (four) hours as needed for pain.     Marland Kitchen QUEtiapine (SEROQUEL) 25 MG tablet Take 25 mg by mouth at bedtime.     . sertraline (ZOLOFT) 100 MG tablet Take 100 mg by mouth daily.     . tucatinib (TUKYSA) 150 MG tablet Take 150 mg by mouth daily. Take every 12 hrs at the same time each day with or without a meal.     . acetaminophen (TYLENOL) 325 MG tablet Take 2 tablets (650 mg total) by mouth every 6 (six) hours as needed for mild pain (or Fever >/= 101).     Marland Kitchen allopurinol (ZYLOPRIM) 300 MG tablet TAKE ONE TABLET BY MOUTH EVERY DAY (Patient taking differently: Take 300 mg by  mouth daily.) 90 tablet 3   . ALPRAZolam (XANAX) 1 MG tablet Take 1 mg by mouth daily as needed for anxiety. (Patient not taking: Reported on 09/18/2020)     . dexamethasone (DECADRON) 4 MG tablet Take 1 tablet (4 mg total) by mouth daily. (Patient taking differently: Take 4 mg by mouth 2 (two) times daily.) 60 tablet 1   . iron polysaccharides (NIFEREX) 150 MG capsule Take 1 capsule (150 mg total) by mouth daily. 30 capsule 0   . levETIRAcetam (KEPPRA) 750 MG tablet TAKE 1 TABLET BY MOUTH 2 TIMES DAILY. (Patient taking differently: Take 750 mg by mouth 2 (two) times daily.) 60 tablet 1   . methadone (DOLOPHINE) 10 MG tablet Take 10 mg by mouth in the morning, at noon, and at bedtime.     . Multiple Vitamin (MULTIVITAMIN) capsule Take 1 capsule by mouth daily.       . mupirocin ointment (BACTROBAN) 2 % Place 1 application into the nose 2 (two) times daily. (Patient not taking: Reported on 06/12/2020) 22 g 0   . NARCAN 4 MG/0.1ML LIQD nasal spray kit 1 spray as directed. (Patient not taking: Reported on 09/18/2020)     . omeprazole (PRILOSEC) 40 MG capsule Take 1 capsule (40 mg total) by mouth daily. (Patient not taking:  Reported on 06/12/2020) 30 capsule 1   . ondansetron (ZOFRAN) 4 MG tablet Take 1 tablet (4 mg total) by mouth 3 (three) times daily as needed. 20 tablet 3   . Potassium Chloride ER 20 MEQ TBCR Take 1 tablet by mouth daily. 60 tablet 2   . prochlorperazine (COMPAZINE) 10 MG tablet Take 1 tablet (10 mg total) by mouth every 6 (six) hours as needed for nausea or vomiting. 30 tablet 1   . traZODone (DESYREL) 50 MG tablet Take 25-50 mg by mouth at bedtime. (Patient not taking: Reported on 09/18/2020)     . triamterene-hydrochlorothiazide (MAXZIDE-25) 37.5-25 MG tablet Take 1 tablet by mouth daily. 30 tablet 1   . tucatinib (TUKYSA) 50 MG tablet Take 3 tablets (150 mg total) by mouth in the morning. Take as directed by MD. 60 tablet 3   . tucatinib (TUKYSA) 50 MG tablet Take 3 tablets (150 mg  total) by mouth every morning. Take every 12 hr at the same time each day with or without a meal. (Patient not taking: Reported on 11/11/2020) 60 tablet 6 Not Taking at Unknown time   Scheduled:  . Chlorhexidine Gluconate Cloth  6 each Topical Daily  . dexamethasone (DECADRON) injection  4 mg Intravenous Q6H  . LORazepam  1 mg Intravenous Once  . phenytoin (DILANTIN) IV  100 mg Intravenous Q8H   Infusions:  . levETIRAcetam Stopped (11/23/2020 1612)    Assessment: 4 yoF with PMH breast CA with mets to brain, noted to have progressive difficulty with movement x 2 days. Noted to have 2 witnessed seizures in ED. CT/MRI show large R-sided hemorrhagic temporal and cystic frontal metastases with accompanying 5 mm midline left shift, significantly progressed from prior imaging. Patient has previously been on Keppra and Decadron during the past few years, but naive to phenytoin. Fosphenytoin/Phenytoin initiated by Neurology with Pharmacy to monitor. . -fosphenytoin 59m/kg given ay 7:30pm on 1/10 -PHT level= 19.9 this am (albumin= 3.4, corrected ~ 25)   Goals of care:  Total phenytoin level: 10-20 mcg/ml   Plan:  -Continue phenytoin 1087mIV q8h -Will recheck a phenytoin level later this week  AnHildred LaserPharmD Clinical Pharmacist **Pharmacist phone directory can now be found on [email or web address] (PW [email or web address]).  Listed under [place]

## 2020-11-17 NOTE — Progress Notes (Signed)
Brief HPI: 65 year old female with a medical history significant for breast cancer with multiple brain mets who presented with complaints of altered mental status progressive over 3 days. On arrival to the ER, [name] had 2 witnessed seizures. She was loaded with 1 gram of Keppra and started on 1,000mg  BID.  Per neuro oncology recommendations, she was started on dexamethasone for increased size of brain mets and increased mass-effect with midline right to left shift on MRI 11/13/2020.  LTM revealed continued seizure activity frequency and phenytoin 100mg  q8h was added.   Subjective: During examination this morning, [name] continued to have seizures. She had approximately 4 focal seizures lasting 10-15 seconds during the 15 minute assessment characterized by a left gaze preference and a leftward head turn.   Exam: Vitals:   11/17/20 0348 11/17/20 0800  BP: (!) 159/80 (!) 159/62  Pulse: 69   Resp: 20 (!) 24  Temp: 98 F (36.7 C)   SpO2: 100% 100%   Gen: In bed, awake, intermittently seizing; captured on LTM EEG.  Resp: non-labored breathing  Abd: soft, non-distended   Neuro: MS: Awake, alert and oriented to name, place, and age. Not oriented to month. Naming and comprehension intact. Repetition with garbled speech and omission of multiple words.  CN:  Visual fields are full. PERRL bilaterally. EOMs intact with leftward gaze preference during seizure activity; resolves in approximately 10-15 seconds. Face symmetric resting and smiling, tongue protrudes midline. Hearing is intact to voice.  Motor: Left upper extremity 2/5 with immediate drift to bed without examiner support, no antigravity movement; withdrawals to noxious stimuli Left lower extremity: 3/5 with no antigravity movement; withdrawals to noxious stimuli.  Right upper extremity 4/5 with resistance to examiner and some antigravity movement. Right lower extremity 4/5 with antigravity movement. Weaker on the left upper and lower  extremities. Follows commands with wiggling toes on RLE. Does not follow commands with upper extremities. Sensory: Grimace and vocalization with noxious stimuli.  Cerebellar: Unable to assess FKF and HKS due to patient weakness.  Pertinent Labs: CBC    Component Value Date/Time   WBC 10.7 (H) 11/17/2020 0500   RBC 4.04 11/17/2020 0500   HGB 11.7 (L) 11/17/2020 0500   HGB 10.8 (L) 09/18/2020 0814   HGB 11.3 (L) 11/09/2017 1214   HCT 35.3 (L) 11/17/2020 0500   HCT 35.3 11/09/2017 1214   PLT 271 11/17/2020 0500   PLT 351 09/18/2020 0814   PLT 231 11/09/2017 1214   MCV 87.4 11/17/2020 0500   MCV 83.9 11/09/2017 1214   MCH 29.0 11/17/2020 0500   MCHC 33.1 11/17/2020 0500   RDW 16.2 (H) 11/17/2020 0500   RDW 16.9 (H) 11/09/2017 1214   LYMPHSABS 1.3 11/24/2020 1000   LYMPHSABS 1.5 11/09/2017 1214   MONOABS 0.6 12/04/2020 1000   MONOABS 0.7 11/09/2017 1214   EOSABS 0.1 11/08/2020 1000   EOSABS 0.3 11/09/2017 1214   BASOSABS 0.0 11/10/2020 1000   BASOSABS 0.1 11/09/2017 1214  CMP     Component Value Date/Time   NA 141 11/17/2020 0500   NA 142 11/09/2017 1214   K 3.7 11/17/2020 0500   K 3.9 11/09/2017 1214   CL 106 11/17/2020 0500   CL 101 03/07/2013 1411   CO2 23 11/17/2020 0500   CO2 32 (H) 11/09/2017 1214   GLUCOSE 138 (H) 11/17/2020 0500   GLUCOSE 110 11/09/2017 1214   GLUCOSE 92 03/07/2013 1411   BUN 13 11/17/2020 0500   BUN 19.1 11/09/2017 1214  CREATININE 0.80 11/17/2020 0500   CREATININE 0.86 09/18/2020 0814   CREATININE 1.0 11/09/2017 1214   CALCIUM 9.2 11/17/2020 0500   CALCIUM 9.1 11/09/2017 1214   PROT 6.2 (L) 11/17/2020 0500   PROT 7.6 11/09/2017 1214   ALBUMIN 3.4 (L) 11/17/2020 0500   ALBUMIN 3.5 11/09/2017 1214   AST 24 11/17/2020 0500   AST 21 09/18/2020 0814   AST 30 11/09/2017 1214   ALT 20 11/17/2020 0500   ALT 10 09/18/2020 0814   ALT 19 11/09/2017 1214   ALKPHOS 59 11/17/2020 0500   ALKPHOS 120 11/09/2017 1214   BILITOT 0.7 11/17/2020 0500    BILITOT 0.4 09/18/2020 0814   BILITOT <0.22 11/09/2017 1214   GFRNONAA >60 11/17/2020 0500   GFRNONAA >60 09/18/2020 0814   GFRAA >60 07/24/2020 0947   MRI brain 1/7:   IMPRESSION: Interval progression of large mass lesion in the right temporal lobe with surrounding edema compatible with tumor progression. Progression of right frontal cystic metastasis. Increased mass-effect and 5 mm midline shift to the left compared to the prior study.  CT head 1/10:  IMPRESSION: Grossly unchanged appearance of hemorrhagic right cerebral metastases, perilesional edema and 5 mm leftward midline shift. No acute intracranial process.  Assessment: 65 year old woman with known brain metastases to the right temporal and right frontal area from primary breast cancer, presenting with altered mental status as well as 2 witnessed seizures in the emergency room and continues to remain confused and encephalopathic. Imaging concerning for progression of both the right frontal and right temporal hemorrhagic mets. Examination revealed ongoing seizures while on LTM EEG concern for status epilepticus despite increased doses of dexamethasone, Keppra, and the addition of phenytoin.   Impression:  Known brain metastases  Altered mental status Continued seizure activity with increasing frequency- concern for status epilepticus.   Recommendations: 1. Continue dexamethasone 4 mg IV every 6 hours 2. Was loaded with 1 gram Keppra IV, started 1 gram IV BID. Give additional 1g Keppra load and increase Keppra to 1500BID 3. PRN Ativan for seizure lasting longer than 5 minutes 4. Continue LTM EEG and correlate results 5. Continue Dilantin 100mg  TID, and check level in the AM 6. Avoid Vimpat due to intermittent bradycardia 7. MRI cervial, lumbar, and sacral spine pending to evaluate for spinal metastasis to further determine goals of care 8. Inpatient seizure precautions 9. Outpatient seizure precautions: Per [facility]  statutes, patients with seizures are not allowed to drive until they have been seizure-free for six months. Use caution when using heavy equipment or power tools. Avoid working on ladders or at heights. Take showers instead of baths. Ensure the water temperature is not too high on the home water heater. Do not go swimming alone. Do not lock yourself in a room alone (i.e. bathroom). When caring for infants or small children, sit down when holding, feeding, or changing them to minimize risk of injury to the child in the event you have a seizure. Maintain good sleep hygiene. Avoid alcohol.    Patient seen and examined with MD.  Anibal Henderson, AGACNP-BC [facility] [phone]  Attending addendum: 11:25 AM: Patient continues to have both clinical and electrographic seizures. Will add phenobarbital. Check level after 1 hour of the load. Will follow  In addition to the above, I have also briefly discussed the case with Dr. [name].  He agrees with the current plan and is amenable to [name] trying to control the seizures.  The only surgical option would be debulking  craniotomy, which can be discussed at some point if medical treatment remains difficult.  Attending Neurohospitalist Addendum Patient seen and examined with APP/Resident. Agree with the history and physical as documented above. Agree with the plan as documented, which I helped formulate. I have independently reviewed the chart, obtained history, review of systems and examined the patient.I have personally reviewed pertinent head/neck/spine imaging (CT/MRI).  Please feel free to call with any questions. --- Amie Portland, MD Neurologist [facility] Pager: [phone]  If 7pm to 7am, please call on call as listed on AMION.

## 2020-11-17 NOTE — Progress Notes (Signed)
Maintenance completed; reglued and reprepped Cz, F7, F8, Fp1, Fp2, T8, P8, P4, O2, and A2. No skin breakdown was seen.

## 2020-11-17 NOTE — Progress Notes (Signed)
Patient began to move during exam.  Not able to obtain images due to patient moving.  Patient medicated and continued to move.

## 2020-11-17 NOTE — Progress Notes (Addendum)
Stat LTM EEG hooked up with MRI compatible leads. Study is running - no initial skin breakdown - push button tested - neuro notified.

## 2020-11-17 NOTE — Progress Notes (Signed)
PROGRESS NOTE  [name] FYB:[id number] DOB: 23-May-1956 DOA: Nov 17, 2020 PCP: Patient, No Pcp Per  Brief History   65 year old woman PMH metastatic breast cancer, DVT on anticoagulation, presenting with altered mental status and progressive weakness to the point where she is unable to walk.  Recent MRI showed increased brain mass with vasogenic edema.  Neurology, neurooncology and neurosurgery were consulted.  Patient developed seizures and was transferred to [facility] for continuous EEG given concern for status epilepticus.   A & P  Status epilepticus, new seizure disorder with acute encephalopathy secondary to breast cancer metastatic to the brain with associated bilateral lower extremity weakness --Continue dexamethasone, Keppra, EEG, Dilantin as per neurology.  Avoid Vimpat secondary to intermittent bradycardia. --Phenobarbital padded as per neurology --MRI cervical lumbar and sacral spine to rule out spinal metastasis --Seizure precautions  PMH DVT? --no anticoagulation listed on med-rec  Chronic pain on methadone --hold for now as NPO    Disposition Plan:  Discussion: Continue management per neurology and other specialist.  Currently appears stable on progressive unit.  I notified Dr. Jana Hakim of admission.  Status is: Inpatient  Remains inpatient appropriate because:IV treatments appropriate due to intensity of illness or inability to take PO and Inpatient level of care appropriate due to severity of illness   Dispo: The patient is from: Home              Anticipated d/c is to: TBD              Anticipated d/c date is: > 3 days              Patient currently is not medically stable to d/c.  DVT prophylaxis: SCDs Start: November 17, 2020 1538    Code Status: DNR Family Communication: daughter at bedside  Murray Hodgkins, MD  [facility] Direct contact: see [email or web address] (further directions at bottom of note if needed) 7PM-7AM contact night coverage as at bottom of  note 11/17/2020, 12:57 PM  LOS: 1 day     Consults:  . Neurosurgery . Neurology . Neurooncology   Interval History/Subjective  CC: f/u seizure  Feels ok, no complaints Appears confused  Objective   Vitals:  Vitals:   11/17/20 0800 11/17/20 1200  BP: (!) 159/62 (!) 152/59  Pulse:  67  Resp: (!) 24 20  Temp:  99.1 F (37.3 C)  SpO2: 100% 100%    Exam:  Constitutional:   . Appears calm and comfortable ENMT:  . grossly normal hearing  Respiratory:  . CTA bilaterally, no w/r/r.  . Respiratory effort normal. Cardiovascular:  . RRR, no m/r/g . No LE extremity edema   Musculoskeletal:  . RUE, LUE, RLE, LLE   . Moves all extremities Psychiatric:  . Mental status o Mood cannot assess, affect odd . Appears to be seizing with leftward gaze and twitching.  Does not consistently follow commands.  I have personally reviewed the following:   Today's Data  . CBG, CMP, CBC unremarkable  Scheduled Meds: . Chlorhexidine Gluconate Cloth  6 each Topical Daily  . dexamethasone (DECADRON) injection  4 mg Intravenous Q6H  . LORazepam  1 mg Intravenous Once  . PHENObarbital  65 mg Intravenous BID  . phenytoin (DILANTIN) IV  100 mg Intravenous Q8H   Continuous Infusions: . levETIRAcetam    . PHENObarbital 1000 mg IVPB      Principal Problem:   Status epilepticus (HCC) Active Problems:   Chronic pain   Acute metabolic encephalopathy   Cancer of right breast  metastatic to brain ([place])   Seizures ([place])   LOS: 1 day   How to contact the [facility] Attending or Consulting provider [place] or covering provider during after hours [place], for this patient?  1. Check the care team in [facility] and look for a) attending/consulting [facility] provider listed and b) the [facility] team listed 2. Log into [email or web address] and use [place]'s universal password to access. If you do not have the password, please contact the hospital operator. 3. Locate the [facility] provider you are looking for under [facility] and page to a number that you can be directly reached. 4. If you still have difficulty reaching the provider, please page the [facility] (Director on Call) for the Hospitalists listed on amion for assistance.

## 2020-11-17 NOTE — Progress Notes (Signed)
Consult: phenytoin and AED managment Indication: Malignant seizures  Allergies  Allergen Reactions   Tramadol Nausea Only   Hydrocodone-Acetaminophen Itching and Rash   Pork-Derived Products Other (See Comments)    Pt says she just doesn't eat pork; has no reaction to pork products    Patient Measurements: Height: 5\' 6"  (167.6 cm) Weight: 74.7 kg (164 lb 10.9 oz) IBW/kg (Calculated) : 59.3 TPN AdjBW (KG): 72.6 Body mass index is 26.58 kg/m.  Labs: Lab Results  Component Value Date/Time   Albumin 3.4 (L) 11/17/2020 0500   Phenytoin Lvl 19.9 11/17/2020 0600   Lab Results  Component Value Date   PHENYTOIN 19.9 11/17/2020   Estimated Creatinine Clearance: 72.5 mL/min (by C-G formula based on SCr of 0.8 mg/dL).   Assessment: 89 yoF with PMH breast CA with mets to brain, noted to have progressive difficulty with movement x 2 days. Noted to have 2 witnessed seizures in ED. CT/MRI show large R-sided hemorrhagic temporal and cystic frontal metastases with accompanying 5 mm midline left shift, significantly progressed from prior imaging. Patient has previously been on Keppra and Decadron during the past few years, but naive to phenytoin. Fosphenytoin/Phenytoin initiated by Neurology with Pharmacy to monitor.  -fosphenytoin 20mg /kg given ay 7:30pm on 1/10 -PHT level= 19.9 this am (albumin= 3.4, corrected ~ 25)  Ongoing seizures noted and phenobarbital added today (1500mg  load; ~ 20mg /kg) then 65mg  IV bid. Per neuro- draw a phenobarbital level 1 hour after the infusion.  PM: phenobarbital level was therapeutic at 33 although drawn ~4 hrs post load.  Goals of care:  Total phenytoin level: 10-20 mcg/ml Phenobarbital level 10-40 mcg/ml  Plan:  -Continue phenytoin 100mg  IV q8h -Will recheck a phenytoin level later this week -Continue phenobarbital 65 mg IV bid -Check steady-state phenobarbital trough in ~5 days  Thank you for involving pharmacy in this patient's care.  Renold Genta, PharmD, BCPS Clinical Pharmacist Clinical phone for 11/17/2020 until 10p is [phone] 11/17/2020 7:56 PM  **Pharmacist phone directory can be found on [email or web address] listed under [place]**

## 2020-11-17 NOTE — Progress Notes (Signed)
Oral Oncology Pharmacist Encounter  Prescription refill for Tukysa (tucatinib) 50 mg tablets directions updated with the following correct directions: "Take 3 tablets (150 mg total) by mouth daily. Take as directed by MD" Updated prescription sent to [facility]. Confirmed with Dr. Jana Hakim that patient remains on total daily dose of tucatinib 150 mg.   Patient is required to use tucatinib 50 mg tablets due to insurance prohibiting billing for 60 day supply of tucatinib 150 mg tablets.   Leron Croak, PharmD, BCPS Hematology/Oncology Clinical Pharmacist [facility] [phone] 11/17/2020 12:53 PM

## 2020-11-18 ENCOUNTER — Inpatient Hospital Stay (HOSPITAL_COMMUNITY): Payer: Medicare Other

## 2020-11-18 DIAGNOSIS — R29898 Other symptoms and signs involving the musculoskeletal system: Secondary | ICD-10-CM | POA: Diagnosis not present

## 2020-11-18 DIAGNOSIS — G40901 Epilepsy, unspecified, not intractable, with status epilepticus: Secondary | ICD-10-CM | POA: Diagnosis not present

## 2020-11-18 MED ORDER — LORAZEPAM 2 MG/ML IJ SOLN
2.0000 mg | INTRAMUSCULAR | Status: DC | PRN
  Administered 2020-11-19 (×2): 2 mg via INTRAVENOUS
  Filled 2020-11-18 (×2): qty 1

## 2020-11-18 NOTE — Progress Notes (Addendum)
PROGRESS NOTE  [name] PNT:[id number] DOB: 12/10/1955 DOA: 11/12/2020 PCP: Patient, No Pcp Per  Brief History   65 year old woman PMH metastatic breast cancer, DVT on anticoagulation, presenting with altered mental status and progressive weakness to the point where she was unable to walk.  Recent MRI showed increased brain mets with vasogenic edema.  Neurology, neurooncology were consulted.  Patient developed seizures and was transferred to [facility] for continuous EEG given concern for status epilepticus.   A & P  Status epilepticus, new seizure disorder with acute encephalopathy secondary to breast cancer metastatic to the brain with associated bilateral lower extremity weakness --Neurology following, currently on Keppra, Dilantin  -Dose of Keppra increased and started on phenobarbital yesterday  -MRI brain notes progression of metastatic lesions and vasogenic edema -Continue dexamethasone -Appreciate oncology input, Dr.[name] recommended palliative referral, which seems appropriate at this time, discussed poor prognosis with pts son today  Metastatic breast cancer Multiple brain metastasis -Treated with brain radiation from 09/2019 -10/2019 -Reportedly supposed to be on immunotherapy with tucatinib -Appreciate oncology input from Dr. Jana Hakim today, will involve palliative care  History of right arm DVT -Completed anticoagulation with Coumadin in 2014 -Dopplers negative for DVT this admission  Chronic pain  -Methadone was held on admission due to lethargy and intermittently being n.p.o.  DVT prophylaxis: SCDs Start: 11/11/2020 1538    Code Status: DNR Family Communication: stopped by again and discussed poor prognosis with son today  Remains inpatient appropriate because:IV treatments appropriate due to intensity of illness or inability to take PO and Inpatient level of care appropriate due to severity of illness   Dispo: The patient is from: Home               Anticipated d/c is to: TBD              Anticipated d/c date is: > 3 days              Patient currently is not medically stable to d/c.  Domenic Polite, MD  11/18/2020, 10:16 AM  LOS: 2 days     Consults:  . Neurology . Neurooncology   Interval History/Subjective  -Patient seen this morning, no events overnight, oral intake is poor, remains lethargic on multiple antiepileptic meds  Objective   Vitals:  Vitals:   11/18/20 0800 11/18/20 0917  BP: (!) 152/66   Pulse: 69   Resp: 20   Temp:  98.6 F (37 C)  SpO2: 100%     Exam: Gen: Chronically ill female appears much older than stated age, responds to verbal stimuli, opens eyes and answers a few questions overall somewhat lethargic CVS: S1-S2, regular rate rhythm Lungs: Poor air movement anteriorly Abdomen: Soft, nontender, bowel sounds present Extremities: No edema Neuro: Somnolent but arousable, answers few questions, occasionally follows commands,,2-3/5 upper extremity weakness bilaterally and bilateral lower extremity weakness as well but poor effort  I have personally reviewed the following:   Today's Data  . CBG, CMP, CBC unremarkable  Scheduled Meds: . Chlorhexidine Gluconate Cloth  6 each Topical Daily  . dexamethasone (DECADRON) injection  4 mg Intravenous Q6H  . PHENObarbital  65 mg Intravenous BID  . phenytoin (DILANTIN) IV  100 mg Intravenous Q8H   Continuous Infusions: . levETIRAcetam 1,500 mg (11/18/20 0906)    Principal Problem:   Status epilepticus ([place]) Active Problems:   Chronic pain   Acute metabolic encephalopathy   Cancer of right breast metastatic to brain ([place])   Seizures ([place])  LOS: 2 days

## 2020-11-18 NOTE — Progress Notes (Signed)
Attempted MRI again today. Patient still unable to follow commands to hold still for MRI. Limited imaging obtained and upon radiologist review felt the images were not diagnostic enough to continue. Will send images for best obtainable read by radiologist. If full diagnostic study is still needed, will need alternate plan to sedate the patient or wait until her status improves.

## 2020-11-18 NOTE — Procedures (Signed)
Patient Name: Amera Banos  MRN: [id number]  Epilepsy Attending: Lora Havens  Referring Physician/Provider: Dr Lesleigh Noe Duration: 11/18/2020 0157 to 11/19/2020  0157  Patient history: 65 year old female with known brain metastasis presented with altered mental status.  EEG to evaluate for seizures.  Level of alertness: Awake, sleep  AEDs during EEG study: Keppra, Dilantin, Phenobarb  Technical aspects: This EEG study was done with scalp electrodes positioned according to the 10-20 International system of electrode placement. Electrical activity was acquired at a sampling rate  of 500Hz  and reviewed with a high frequency filter of 70Hz  and a low frequency filter of 1Hz . EEG data were recorded continuously and digitally stored.   Description: No clear posterior dominant rhythm was seen.  Sleep was characterized by vertex waves, sleep spindles (12 to 14 Hz), maximal frontocentral region.  EEG showed continuous 3 to 6 Hz theta-delta slowing in the right fronto-centro- temporal region.  Sharp waves were noted in right fronto-centro temporal region. At times the sharp waves appeared periodic at 0.5 to 1 Hz and rhythmic lasting about 2 minutes on an average without definite evolution.  ABNORMALITY -Sharp waves,right fronto-centro- temporal region -Continuous slow, right fronto-centro- temporal region  IMPRESSION: This study showed evidence of epileptogenicity and cortical dysfunction arising from right frontocentral region suggestive of underlying structural abnormality.   The right frontal sharp waves at times appeared periodic and rhythmic which is on the ictal-interictal continuum with higher concern for seizures.  Magdelene Ruark [name]

## 2020-11-18 NOTE — Progress Notes (Signed)
Air mattress ordered per protocol, informed that there are no more air mattress bed available at this time and as soon as one is available it will be delivered.   Clyde Canterbury, RN

## 2020-11-18 NOTE — Progress Notes (Addendum)
Subjective: No clinical seizures overnight.  Patient's son [name] is at bedside  ROS: Unable to obtain due to poor mental status  Examination  Vital signs in last 24 hours: Temp:  [97.6 F (36.4 C)-99.4 F (37.4 C)] 99.4 F (37.4 C) (01/12 1110) Pulse Rate:  [67-75] 75 (01/12 1110) Resp:  [20-22] 20 (01/12 1110) BP: (130-167)/(66-77) 139/73 (01/12 1110) SpO2:  [97 %-100 %] 97 % (01/12 1110)  General: lying in bed, not in apparent distress CVS: pulse-normal rate and rhythm RS: breathing comfortably, coarse breath sounds bilaterally Extremities: normal, warm Neuro: Lethargic, opens eyes to noxious stimulation, significantly dysarthric speech, able to tell me her name, thinks her son [name] is her brother, able to tell me she is in a hospital in [place], not oriented to time, able to tell me I was showing her to fingers but did not name any other objects or follow any other commands, PERRLA, no gaze deviation, no apparent facial asymmetry, withdraws to noxious stim in all 4 extremities  Basic Metabolic Panel: Recent Labs  Lab 11/07/2020 1000 11/25/2020 1538 11/17/20 0500  NA 142  --  141  K 3.3*  --  3.7  CL 109  --  106  CO2 23  --  23  GLUCOSE 98  --  138*  BUN 25*  --  13  CREATININE 0.90  --  0.80  CALCIUM 8.2*  --  9.2  MG  --  1.6*  --   PHOS  --   --  2.9    CBC: Recent Labs  Lab 11/28/2020 1000 11/17/20 0500  WBC 9.0 10.7*  NEUTROABS 6.8  --   HGB 10.4* 11.7*  HCT 32.8* 35.3*  MCV 90.9 87.4  PLT 218 271     Coagulation Studies: No results for input(s): LABPROT, INR in the last 72 hours.  Imaging Unable to obtain MRI brain due to significant movement artifact per technician note.  MRI total spine 11/18/2020: Image quality degraded by extensive motion artifact.  No intravenous contrast was given.  No definite spinal metastasis.  Enlarging cystic mass in the subcutaneous tissue of the upper back likely due to progressive metastatic disease.    CT head without  contrast 11/12/2020:Grossly unchanged appearance of hemorrhagic right cerebral metastases, perilesional edema and 5 mm leftward midline shift.  MRI brain with and without contrast 11/13/2020: Interval progression of large mass lesion in the right temporal lobe with surrounding edema compatible with tumor progression. Progression of right frontal cystic metastasis. Increased mass-effect and 5 mm midline shift to the left compared to the prior study.      ASSESSMENT AND PLAN: 47 old female with known brain metastasis in the right temporal and frontal area from primary breast cancer presented with altered mental status and was noted to have multiple seizures.  Status epilepticus (resolved) Acute encephalopathy, secondary to seizures, brain mets Breast cancer with metastatic brain disease Cerebral edema from brain mets -Seizures due to underlying metastatic disease -LTM EEG initially showed status epilepticus which has since resolved  Recommendations -Continue Keppra 1500 mg twice daily, Dilantin 100 mg every 8 hours and phenobarbital 65 mg twice daily. -Continue LTM EEG for another 24 hours, if no seizures will discontinue tomorrow -If any further seizures, will add perampanel.  Avoiding Vimpat as patient has had episodes of bradycardia -Continue dexamethasone 4 mg every 6 hours -Continue seizure precautions -As needed IV Ativan 2 mg for clinical seizure-like activity lasting more than 2 minutes -Discussed MRI findings, antiseizure medications with  son at bedside -We will defer management of metastatic disease to Dr. [name] -Management of rest of comorbidities per primary team  I have spent a total of  40  minutes with the patient reviewing hospital notes,  test results, labs and examining the patient as well as establishing an assessment and plan that was discussed personally with the patient's son at bedside.  > 50% of time was spent in direct patient care.    Zeb Comfort Epilepsy [facility] For questions after 5pm please refer to AMION to reach the Neurologist on call

## 2020-11-18 NOTE — Progress Notes (Signed)
Pt returned from MRI. LTM restarted, electrodes checked, reset as needed. No skin break down noted.

## 2020-11-18 NOTE — Progress Notes (Signed)
[name]   DOB:[birth year]   JJ#:[id number]   HBZ#:[id number]  Subjective:  I was alerted to [name]'s admission yesterday-- she presented to the ED 11/17/2020 with altered mental status and right leg weakness. She had 2 witnessed seizures in the ED and was transferred to [facility] for treatment of status epilepticus. She is currenrtly on dexamethasone, levaticeram, phenytoin, phenobarbital and lorazepam.  [name] was alert, knew my name and that she was at [facility], did not know year; asked if she had pain she said she did, but I was not able to localize where  Objective: African American woman examined in bed Vitals:   11/17/20 2301 11/18/20 0400  BP: (!) 160/77 (!) 167/74  Pulse: 70   Resp: (!) 21 (!) 22  Temp: 97.6 F (36.4 C) 98.3 F (36.8 C)  SpO2: 100% 100%    Body mass index is 26.58 kg/m.  Intake/Output Summary (Last 24 hours) at 11/18/2020 0842 Last data filed at 11/18/2020 0400 Gross per 24 hour  Intake 100 ml  Output 800 ml  Net -700 ml       Lungs no rales or wheezes--auscultated anterolaterally  Heart regular rate and rhythm  Abdomen soft, +BS  Neuro alert and oriented x2, speech difficult to understand but grammatical  Breast exam: deferred  CBG (last 3)  Recent Labs    11/12/2020 1523  GLUCAP 156*     Labs:  Lab Results  Component Value Date   WBC 10.7 (H) 11/17/2020   HGB 11.7 (L) 11/17/2020   HCT 35.3 (L) 11/17/2020   MCV 87.4 11/17/2020   PLT 271 11/17/2020   NEUTROABS 6.8 11/08/2020    _0 @  Urine Studies No results for input(s): UHGB, CRYS in the last 72 hours.  Invalid input(s): UACOL, UAPR, USPG, UPH, UTP, UGL, UKET, UBIL, UNIT, UROB, ULEU, UEPI, UWBC, URBC, UBAC, CAST, UCOM, BILUA  Basic Metabolic Panel: Recent Labs  Lab 11/14/2020 1000 12/03/2020 1538 11/17/20 0500  NA 142  --  141  K 3.3*  --  3.7  CL 109  --  106  CO2 23  --  23  GLUCOSE 98  --  138*  BUN 25*  --  13  CREATININE 0.90  --  0.80  CALCIUM 8.2*  --  9.2  MG   --  1.6*  --   PHOS  --   --  2.9   GFR Estimated Creatinine Clearance: 72.5 mL/min (by C-G formula based on SCr of 0.8 mg/dL). Liver Function Tests: Recent Labs  Lab 11/19/2020 1000 11/17/20 0500  AST 22 24  ALT 18 20  ALKPHOS 49 59  BILITOT 0.6 0.7  PROT 5.8* 6.2*  ALBUMIN 3.3* 3.4*   No results for input(s): LIPASE, AMYLASE in the last 168 hours. No results for input(s): AMMONIA in the last 168 hours. Coagulation profile No results for input(s): INR, PROTIME in the last 168 hours.  CBC: Recent Labs  Lab 11/09/2020 1000 11/17/20 0500  WBC 9.0 10.7*  NEUTROABS 6.8  --   HGB 10.4* 11.7*  HCT 32.8* 35.3*  MCV 90.9 87.4  PLT 218 271   Cardiac Enzymes: No results for input(s): CKTOTAL, CKMB, CKMBINDEX, TROPONINI in the last 168 hours. BNP: Invalid input(s): POCBNP CBG: Recent Labs  Lab 11/09/2020 1523  GLUCAP 156*   D-Dimer No results for input(s): DDIMER in the last 72 hours. Hgb A1c No results for input(s): HGBA1C in the last 72 hours. Lipid Profile No results for input(s): CHOL, HDL, LDLCALC, TRIG, CHOLHDL, LDLDIRECT  in the last 72 hours. Thyroid function studies No results for input(s): TSH, T4TOTAL, T3FREE, THYROIDAB in the last 72 hours.  Invalid input(s): FREET3 Anemia work up No results for input(s): VITAMINB12, FOLATE, FERRITIN, TIBC, IRON, RETICCTPCT in the last 72 hours. Microbiology Recent Results (from the past 240 hour(s))  Resp Panel by RT-PCR (Flu A&B, Covid) Nasopharyngeal Swab     Status: None   Collection Time: 11/19/2020  9:59 AM   Specimen: Nasopharyngeal Swab; Nasopharyngeal(NP) swabs in vial transport medium  Result Value Ref Range Status   SARS Coronavirus 2 by RT PCR NEGATIVE NEGATIVE Final    Comment: (NOTE) SARS-CoV-2 target nucleic acids are NOT DETECTED.  The SARS-CoV-2 RNA is generally detectable in upper respiratory specimens during the acute phase of infection. The lowest concentration of SARS-CoV-2 viral copies this assay can  detect is 138 copies/mL. A negative result does not preclude SARS-Cov-2 infection and should not be used as the sole basis for treatment or other patient management decisions. A negative result may occur with  improper specimen collection/handling, submission of specimen other than nasopharyngeal swab, presence of viral mutation(s) within the areas targeted by this assay, and inadequate number of viral copies(<138 copies/mL). A negative result must be combined with clinical observations, patient history, and epidemiological information. The expected result is Negative.  Fact Sheet for Patients:  EntrepreneurPulse.com.au  Fact Sheet for Healthcare Providers:  IncredibleEmployment.be  This test is no t yet approved or cleared by the [place] FDA and  has been authorized for detection and/or diagnosis of SARS-CoV-2 by FDA under an Emergency Use Authorization (EUA). This EUA will remain  in effect (meaning this test can be used) for the duration of the COVID-19 declaration under Section 564(b)(1) of the Act, 21 U.S.C.section 360bbb-3(b)(1), unless the authorization is terminated  or revoked sooner.       Influenza A by PCR NEGATIVE NEGATIVE Final   Influenza B by PCR NEGATIVE NEGATIVE Final    Comment: (NOTE) The Xpert Xpress SARS-CoV-2/FLU/RSV plus assay is intended as an aid in the diagnosis of influenza from Nasopharyngeal swab specimens and should not be used as a sole basis for treatment. Nasal washings and aspirates are unacceptable for Xpert Xpress SARS-CoV-2/FLU/RSV testing.  Fact Sheet for Patients: EntrepreneurPulse.com.au  Fact Sheet for Healthcare Providers: IncredibleEmployment.be  This test is not yet approved or cleared by the [place] FDA and has been authorized for detection and/or diagnosis of SARS-CoV-2 by FDA under an Emergency Use Authorization (EUA). This EUA will remain in  effect (meaning this test can be used) for the duration of the COVID-19 declaration under Section 564(b)(1) of the Act, 21 U.S.C. section 360bbb-3(b)(1), unless the authorization is terminated or revoked.  Performed at [facility], [place] [address], [place], [place] [address]   Urine Culture     Status: None   Collection Time: 11/29/2020 10:25 AM   Specimen: Urine, Catheterized  Result Value Ref Range Status   Specimen Description   Final    URINE, CATHETERIZED Performed at [place] [address], [name], [place] [address]    Special Requests   Final    NONE Performed at [facility], [place] [address], [place], [place] [address]    Culture   Final    NO GROWTH Performed at [facility], [place] [address] [place],  [address]    Report Status 11/17/2020 FINAL  Final      Studies:  CT Head Wo Contrast  Result Date: 11/19/2020 CLINICAL DATA:  Dizziness, non-specific Mental status change, unknown cause weakness EXAM: CT HEAD WITHOUT CONTRAST TECHNIQUE: Contiguous axial images were obtained from the base of the skull through the vertex without intravenous contrast. COMPARISON:  11/13/2020 and prior. FINDINGS: Brain: Heterogenous right frontal and right temporal masses are better demonstrated on recent MRI. Redemonstration of right cerebral edema. Confluent left cerebral hypodense foci involving the supratentorial white matter. Leftward midline shift of 5 mm, unchanged. No ventriculomegaly or extra-axial fluid collection. Vascular: No hyperdense vessel or unexpected calcification. Skull: Negative for fracture or focal lesion. Sinuses/Orbits: Normal orbits. Clear paranasal sinuses. No mastoid effusion. Other: None. IMPRESSION: Grossly unchanged appearance of hemorrhagic right cerebral metastases, perilesional edema and 5 mm leftward midline shift. No acute intracranial process. Electronically Signed   By: Primitivo Gauze [name]   On: 12/01/2020 11:18   DG Chest Port 1 View  Result Date: 11/22/2020 CLINICAL DATA:  Weakness today.  History of breast cancer. EXAM: PORTABLE CHEST 1 VIEW COMPARISON:  Single-view of the chest 08/16/2020. FINDINGS: The lungs are clear. Heart size is normal. No pneumothorax or pleural effusion. Port-A-Cath and surgical clips in the right axilla are unchanged. No acute bony abnormality. IMPRESSION: No acute disease. Electronically Signed   By: Inge Rise [name]   On: 12/06/2020 10:17   CT HEAD CODE STROKE WO CONTRAST  Result Date: 11/19/2020 CLINICAL DATA:  Code stroke. Altered mental status. History of breast cancer with brain metastases. EXAM: CT HEAD WITHOUT CONTRAST TECHNIQUE: Contiguous axial images were obtained from the base of the skull through the vertex without intravenous contrast. COMPARISON:  Head CT 11/24/2020 at 10:51 a.m. Head MRI 11/13/2020. FINDINGS: The study is mildly motion degraded. Brain: No acute cortically based infarct, acute intracranial hemorrhage, or extra-axial fluid collection is identified. Known metastases are again seen in the right temporal and right frontal lobes with extensive vasogenic edema in the right cerebral hemisphere, unchanged from today's earlier CT. Associated mass effect including partial effacement of the right lateral ventricle and 4 mm of leftward midline shift is unchanged. Hypodensities in the left cerebral hemispheric white matter are unchanged and may reflect the sequelae of radiation therapy. Vascular: No hyperdense vessel. Skull: No fracture or suspicious osseous lesion. Sinuses/Orbits: Trace left mastoid effusion. Clear paranasal sinuses. Unremarkable orbits. Other: None. ASPECTS [facility] Stroke Program Early CT Score) Not scored with this history. IMPRESSION: 1. Known right cerebral metastases with extensive vasogenic edema, unchanged from today's earlier CT. 2. No evidence of acute intracranial abnormality. These results were  communicated to Dr. Cherylann Ratel at 5:55 pm on 11/18/2020 by text page via the [facility] messaging system. Electronically Signed   By: Logan Bores [name]   On: 12/07/2020 17:55   VAS [name] LOWER EXTREMITY VENOUS (DVT) (ONLY [facility] & [facility])  Result Date: 11/18/2020  Lower Venous DVT Study Indications: Weakness.  Risk Factors: Cancer Breast Cancer with possible Brain Mets DVT Chart states Hx. Limitations: Patient having siezures. AMS and unable to follow commands. Comparison Study: 5 previous exams in 2021 for LE r/o DVT--Negative                   Previous Uppers also negative from 2014 thru 2021 Performing Technologist: Vonzell Schlatter RVT  Examination Guidelines: A complete evaluation includes B-mode imaging, spectral Doppler, color Doppler, and power Doppler as needed of all accessible portions of each vessel. Bilateral testing is considered an integral part of a complete examination. Limited examinations for reoccurring indications may be performed as  noted. The reflux portion of the exam is performed with the patient in reverse Trendelenburg.  +---------+---------------+---------+-----------+----------+--------------+ RIGHT    CompressibilityPhasicitySpontaneityPropertiesThrombus Aging +---------+---------------+---------+-----------+----------+--------------+ CFV      Full           Yes      Yes                                 +---------+---------------+---------+-----------+----------+--------------+ SFJ      Full                                                        +---------+---------------+---------+-----------+----------+--------------+ FV Prox  Full                                                        +---------+---------------+---------+-----------+----------+--------------+ FV Mid   Full                                                        +---------+---------------+---------+-----------+----------+--------------+ FV DistalFull                                                         +---------+---------------+---------+-----------+----------+--------------+ PFV      Full                                                        +---------+---------------+---------+-----------+----------+--------------+ POP                                                   Not visualized +---------+---------------+---------+-----------+----------+--------------+ PTV      Full                                                        +---------+---------------+---------+-----------+----------+--------------+ PERO     Full                                                        +---------+---------------+---------+-----------+----------+--------------+   +---------+---------------+---------+-----------+----------+--------------+ LEFT     CompressibilityPhasicitySpontaneityPropertiesThrombus Aging +---------+---------------+---------+-----------+----------+--------------+ CFV      Full           Yes  Yes                                 +---------+---------------+---------+-----------+----------+--------------+ SFJ      Full                                                        +---------+---------------+---------+-----------+----------+--------------+ FV Prox  Full                                                        +---------+---------------+---------+-----------+----------+--------------+ FV Mid   Full                                                        +---------+---------------+---------+-----------+----------+--------------+ FV DistalFull                                                        +---------+---------------+---------+-----------+----------+--------------+ PFV      Full                                                        +---------+---------------+---------+-----------+----------+--------------+ POP      Full           Yes      Yes                                  +---------+---------------+---------+-----------+----------+--------------+ PTV      Full                                                        +---------+---------------+---------+-----------+----------+--------------+ PERO     Full                                                        +---------+---------------+---------+-----------+----------+--------------+   **LEFT LEG DONE FIRST  Summary: RIGHT: - There is no evidence of deep vein thrombosis in the lower extremity. However, portions of this examination were limited- see technologist comments above.  - No cystic structure found in the popliteal fossa.  LEFT: - There is no evidence of deep vein thrombosis in the lower extremity.  - No cystic structure found in the popliteal fossa.  *See table(s) above for measurements and observations. Electronically signed  by Jamelle Haring on 11/13/2020 at 7:19:35 PM.    Final     Assessment: 65 y.o.  woman with stage IV breast cancer   (1) Status post right upper inner quadrant lumpectomy and sentinel lymph node sampling 03/04/2002 for 2 separate foci of invasive ductal carcinoma, mpT2 pN1 or stage IIB, both foci grade 3, both estrogen and progesterone receptor positive, both HercepTest 3+, Mib-1 56%  (2) Reexcision for margins 05/27/2002 showed no residual cancer in the breast.  (3) The patient refused adjuvant systemic therapy.  (4) Adjuvant radiation treatment completed 08/14/2002.  (5) recurrence in the right breast in 02/2004 showing a morphologically different tumor, again grade 3, again estrogen and progesterone receptor negative, with an MIB-1 of 14% and Herceptest 3+.  (5) Between 03/2004 and 07/2004 she received dose dense Doxorubicin/Cyclophosphamide x 4 given with trastuzumab, followed by weekly Docetaxel x 8, again given with trastuzumab.  (6) Right mastectomy 07/13/2004 showed scattered microscopic foci of residual disease over an area  greater than 5 cm. Margins  were negative.  (7) Postoperative Docetaxel continued until 09/2004.  (8) Trastuzumab (Herceptin) given 08/2004 through 01/2012 with some brief interruptions.  RECURRENT/ STAGE IV DISEASE December 2006 (9) Isolated right cervical nodal recurrence 10/2005, treated with radiation to the right supraclavicular area (total 41.5 gray) completed 12/29/2005.  (10) Navelbine given together with Herceptin  11/2005 through 03/2006.  (9) Left mastectomy 02/13/2006 for ductal carcinoma in situ, grade 2, estrogen and progesterone receptor negative, with negative margins; 0 of 3 lymph nodes involved  (10) treated with Lapatinib and Capecitabine before 10/2009, for an unclear duration and with unclear results (cannot locate data on chart review).  (11) Status post right supraclavicular lymph node biopsy 09/2010 again positive for an invasive ductal carcinoma, estrogen and progesterone receptor negative, HER-2 positive by CISH with a ratio 4.25.  (12) Navelbine given together with Herceptin between 05/2011 and 11/2011.  (13) Carboplatin/ Gemcitabine/ Herceptin given for 2 cycles, in 12/2011 and 01/2012.  (14) TDM-1 ([place]) started 02/2012. Last dose 10/02/2013 after which the patient discontinued treatment at her own discretion. Echo on December 2014 showed a well preserved ejection fraction.  (15) Deep vein thrombosis of the right upper extremity documented 04/20/2011.  She completed anticoagulant therapy with Coumadin on 03/25/2013.  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 Left chest Port-A-Cath placed on 04/08/2013; being flushed every 6 weeks  RIGHT UPPER EXTREMITY ANGIOSARCOMA VS BREAST CANCER: August 2016 (18) treated  at [facility] of [place] August 2016 with paclitaxel, trastuzumab and pertuzumab 1 cycle, afterwards referred to hospice  (19) under the care of hospice of [facility] August 2016 to 05/08/2016, when the patient opted for a second try at chemotherapy  (20) started low-dose Abraxane, trastuzumab and pertuzumab 06/07/2016, to be repeated every 4 weeks.              (a) pretreatment echocardiogram 06/06/2016 showed a 60-65% ejection fraction             (b) significant compliance problems compromise response to treatment             (c)  echocardiogram 02/20/17 LVEF 55-60%             (  d) Abraxane discontinued 07/04/2017 because of possible neuropathy symptoms             (e) Abraxane resumed 09/27/2017             (f) echocardiogram 07/20/2017 showed an ejection fraction of 60-65%             (g) echo 01/22/2018 showed an ejection fraction of 50-55%             (h) CT scan of the chest 05/03/2018 shows no disease involving the lungs liver or lymph nodes, with stable subcutaneous masses as previously noted             (I) Abraxane discontinued August 2019 because of concerns regarding neuropathy-- gemcitabine substituted             (j) echocardiogram 08/20/2018 shows an ejection fraction in the 55-60% range (done every 6 months)             (k) gemcitabine discontinued because of multiple symptoms, Abraxane restarted 09/11/2018, given once every 4 weeks             (l) echocardiogram 07/17/2019 shows a well-preserved ejection fraction at 55-60%             (m) restaging PET scan 08/20/2019 shows hypermetabolic adenopathy, subcutaneous malignancy as clinically appreciated, but no liver or lung parenchymal lesions             (n) echo 09/30/2020 shows an ejection fraction in the 50-55% range.  There is some inferior hypokinesis in the setting of mild left ventricular hypertrophy.   (21) Chronic pain secondary to known metastatic disease             (a) patient signed pain contract 10/19/2016              (b) as of 11/16/2016 receives her pain medications from Dr [name]             (c)  Dr [name] no longer able to prescribe as of February 2019             (d) all pain medications now through [facility], Dr Mirna Mires (as of April 2019)  (22) left breast and left axillary adenopathy noted clinically             (a) left axillary lymph node biopsy on 07/24/2019 benign  (23) BRAIN METASTASES: frontal and temporal lobe metastases noted on brain MRI 09/04/2019             (a) adjuvant radiation 09/11/2019-10/16/2019:                          The brain was treated to 30 Gy in 10 fractions.              (b) tucatinib prescribed December 2020 at 150 mg daily because of methadone interaction, finally started March 2021 but taken irregularly by the patient.             (c) noncontrast head CT 01/12/2020 stable lesions/improved edema             (d) brain MRI 02/01/2020 shows decreased size of the right frontal and temporal lobe metastases with mild residual edema, no mass-effect, and no evidence of new mets             (e) noncontrast MRI of the cervical, thoracic, and lumbar spine 02/11/2020 and 02/12/2020 showed no metastatic disease             (  f) noncontrast head CT on 07/21/2020 shows increase in edema in the right temporal lobe without midline shift             (g) brain MRI shows disease progression versus posttreatment change with substantial increase in regional edema.             (h) brain MRI 11/13/2020 shows disease progression  (24) COVID-19 infection 2019-11-19   Plan:  [name]'s history is complex as detailed above but basically she has peripheral metastases, which are controlled on her chemotherapy and anti-HER2 immunotherapy; and central nervous system metastases, which showed progression on MRI 11/13/2020.  Her CNS disease is followed by our neuro-oncologist Dr [name] and her case was presented at CNS tumor conference 11/09/2020, however I do not have notes on  that discussion. I have asked Dr [name] to update [name].  [name] was supposedly on tucatinib but it is unclear if and how she was taking this medication.  It will be important to involve family in decision making and for that reason I recommend referral to Palliative Care.  Will follow with you.   Chauncey Cruel, MD 11/18/2020  8:42 AM Medical Oncology and Hematology [facility] [address] [place], [place] [address] Tel. [phone]    Fax. [phone]

## 2020-11-18 NOTE — Progress Notes (Signed)
Pt is bed bound, incontinent, has multiple hard masses across her upper back and has a hard time communicating needs. Pt may benefit from an air mattress.

## 2020-11-19 DIAGNOSIS — Z66 Do not resuscitate: Secondary | ICD-10-CM

## 2020-11-19 DIAGNOSIS — C50911 Malignant neoplasm of unspecified site of right female breast: Secondary | ICD-10-CM | POA: Diagnosis not present

## 2020-11-19 DIAGNOSIS — G9389 Other specified disorders of brain: Secondary | ICD-10-CM | POA: Diagnosis not present

## 2020-11-19 DIAGNOSIS — G9341 Metabolic encephalopathy: Secondary | ICD-10-CM

## 2020-11-19 DIAGNOSIS — Z7189 Other specified counseling: Secondary | ICD-10-CM

## 2020-11-19 DIAGNOSIS — G40901 Epilepsy, unspecified, not intractable, with status epilepticus: Secondary | ICD-10-CM | POA: Diagnosis not present

## 2020-11-19 DIAGNOSIS — Z515 Encounter for palliative care: Secondary | ICD-10-CM

## 2020-11-19 LAB — GLUCOSE, CAPILLARY: Glucose-Capillary: 101 mg/dL — ABNORMAL HIGH (ref 70–99)

## 2020-11-19 MED ORDER — MORPHINE SULFATE (PF) 2 MG/ML IV SOLN
1.0000 mg | INTRAVENOUS | Status: DC | PRN
  Administered 2020-11-19 (×4): 2 mg via INTRAVENOUS
  Filled 2020-11-19 (×4): qty 1

## 2020-11-19 MED ORDER — MORPHINE SULFATE (PF) 2 MG/ML IV SOLN: 1.0000 mg | INTRAVENOUS | Status: DC | PRN

## 2020-11-19 MED ORDER — DIPHENHYDRAMINE HCL 50 MG/ML IJ SOLN: 12.5000 mg | INTRAMUSCULAR | Status: DC | PRN

## 2020-11-19 MED ORDER — BIOTENE DRY MOUTH MT LIQD: 15.0000 mL | OROMUCOSAL | Status: DC | PRN

## 2020-11-19 MED ORDER — POLYVINYL ALCOHOL 1.4 % OP SOLN: 1.0000 [drp] | Freq: Four times a day (QID) | OPHTHALMIC | Status: DC | PRN

## 2020-11-19 MED ORDER — GLYCOPYRROLATE 1 MG PO TABS
1.0000 mg | ORAL_TABLET | ORAL | Status: DC | PRN
  Filled 2020-11-19: qty 1

## 2020-11-19 MED ORDER — MORPHINE SULFATE (CONCENTRATE) 10 MG/0.5ML PO SOLN: 5.0000 mg | ORAL | Status: DC | PRN

## 2020-11-19 MED ORDER — SCOPOLAMINE 1 MG/3DAYS TD PT72
1.0000 | MEDICATED_PATCH | TRANSDERMAL | Status: DC
  Administered 2020-11-19: 1.5 mg via TRANSDERMAL
  Filled 2020-11-19: qty 1

## 2020-11-19 MED ORDER — GLYCOPYRROLATE 0.2 MG/ML IJ SOLN: 0.2000 mg | INTRAMUSCULAR | Status: DC | PRN

## 2020-11-19 MED ORDER — LORAZEPAM 2 MG/ML IJ SOLN: 1.0000 mg | INTRAMUSCULAR | Status: DC | PRN

## 2020-11-19 MED ORDER — GLYCOPYRROLATE 0.2 MG/ML IJ SOLN
0.3000 mg | INTRAMUSCULAR | Status: DC | PRN
  Administered 2020-11-19: 0.3 mg via INTRAVENOUS
  Filled 2020-11-19 (×2): qty 2

## 2020-11-19 NOTE — Consult Note (Signed)
Consultation Note Date: 11/19/2020   Patient Name: [name]  DOB: 1956/10/14  MRN: [id number]  Age / Sex: 65 y.o., female   PCP: Patient, No Pcp Per Referring Physician: Blain Pais, MD   REASON FOR CONSULTATION:Establishing goals of care  Palliative Care consult requested for goals of care discussion in this 65 y.o. female with multiple medical problems including DVT, hypertension, and stage IV breast cancer s/p right lumpectomy, mastectomy, left mastectomy, chemoradiation therapy now with brain mets. She presented to the ED with altered mental status and witnessed seizures.   Clinical Assessment and Goals of Care: I have reviewed medical records including lab results, imaging, Epic notes, and MAR, received report from the bedside RN, and assessed the patient. I spoke with patients daughter [name] [facility]) and her sister via phone to discuss diagnosis prognosis, [place], EOL wishes, disposition and options.  I introduced Palliative Medicine as specialized medical care for people living with serious illness. It focuses on providing relief from the symptoms and stress of a serious illness. The goal is to improve quality of life for both the patient and the family. Daughter verbalized understanding.   [name] shares patient resided in the home with assistance from family prior to admission. She has 4 children.   We discussed Her current illness and what it means in the larger context of Her on-going co-morbidities. With specific discussions regarding patient's recent decline, hypotension, and respiratory distress. Natural disease trajectory and expectations at EOL were discussed.  Family tearful expressing understanding of patient's decline. [name] shares that family does not want [name] to suffer and the goal is for comfort. Emotional support provided.   [name] shares family is heartbroken to know they will be losing their mom but also knows she will be at peace and no further  suffering.   I attempted to elicit values and goals of care important to the patient.    Given family's request for care to focus on comfort I created a space to further discuss.. I educated family on what comfort care measures would look like (focusing on Ms. [name]'s comfort and symptom management). Daughters arrived at the bedside during our discussion. I discussed medications to provide comfort and relief.   Family confirms DNR/DNI and wishes to transition all care to focus on comfort/EOL care. Patient has an advanced directive on file which was reviewed, identifying [name] as HCPOA. Education provided on the visitation policy.   Family aware to anticipate hospital death.    Questions and concerns were addressed. The family was encouraged to call with questions or concerns.  PMT will continue to support holistically.   SOCIAL HISTORY:     reports that she has quit smoking. She has never used smokeless tobacco. She reports that she does not drink alcohol and does not use drugs.  CODE STATUS: DNR  ADVANCE DIRECTIVES: Primary Decision Maker: [name] (daughter/HCPOA)    SYMPTOM MANAGEMENT:see below   Palliative Prophylaxis:   Aspiration, Delirium Protocol, Eye Care, Frequent Pain Assessment, Oral Care and Turn Reposition  PSYCHO-SOCIAL/SPIRITUAL:  Support System: Family  Desire for further Chaplaincy support:Yes   Additional Recommendations (Limitations, Scope, Preferences):  Full Comfort Care  PAST MEDICAL HISTORY: Past Medical History:  Diagnosis Date  . Breast cancer ([place])   . DVT (deep venous thrombosis) ([place]) 10/07/2011  . Fever 02/06/2018  . Hypertension   . Radiation 11/29/2005-12/29/2005   right supraclavicular area 4147 cGy  . Radiation 06/26/02-08/14/02   right breast 5040 cGy, tumor  bed boosted to 1260 cGy  . Rheumatic fever     ALLERGIES:  is allergic to tramadol, hydrocodone-acetaminophen, and pork-derived products.   MEDICATIONS:  Current  Facility-Administered Medications  Medication Dose Route Frequency Provider Last Rate Last Admin  . Chlorhexidine Gluconate Cloth 2 % PADS 6 each  6 each Topical Daily Kyle, Tyrone A, DO   6 each at 11/18/20 0907  . dexamethasone (DECADRON) injection 4 mg  4 mg Intravenous Q6H Kyle, Tyrone A, DO   4 mg at 11/19/20 0540  . glycopyrrolate (ROBINUL) injection 0.3 mg  0.3 mg Intravenous Q4H PRN Pickenpack-Cousar, Damaree Sargent N, NP      . levETIRAcetam (KEPPRA) IVPB 1500 mg/ 100 mL premix  1,500 mg Intravenous BID Amie Portland, MD 400 mL/hr at 11/19/20 0854 1,500 mg at 11/19/20 0854  . LORazepam (ATIVAN) injection 2 mg  2 mg Intravenous PRN Lora Havens, MD      . morphine CONCENTRATE 10 MG/0.5ML oral solution 5-10 mg  5-10 mg Oral Q2H PRN Pickenpack-Cousar, Christipher Rieger N, NP      . ondansetron (ZOFRAN) injection 4 mg  4 mg Intravenous Q6H PRN Kyle, Tyrone A, DO      . PHENObarbital (LUMINAL) injection 65 mg  65 mg Intravenous BID Amie Portland, MD   65 mg at 11/19/20 0848  . phenytoin (DILANTIN) injection 100 mg  100 mg Intravenous Q8H Amie Portland, MD   100 mg at 11/19/20 0308  . scopolamine (TRANSDERM-SCOP) 1 MG/3DAYS 1.5 mg  1 patch Transdermal Q72H Blain Pais, MD       Facility-Administered Medications Ordered in Other Encounters  Medication Dose Route Frequency Provider Last Rate Last Admin  . sodium chloride 0.9 % injection 10 mL  10 mL Intravenous PRN [name], [name], MD   10 mL at 03/04/14 1421  . sodium chloride flush (NS) 0.9 % injection 10 mL  10 mL Intracatheter PRN [name], [name], MD   10 mL at 07/30/19 1627    VITAL SIGNS: BP (!) 89/62 (BP Location: Left Leg)   Pulse (!) 106   Temp 98.3 F (36.8 C) (Axillary)   Resp (!) 61   Ht 5\' 6"  (1.676 m)   Wt 74.7 kg   SpO2 (!) 76%   BMI 26.58 kg/m  Filed Weights   12-11-2020 0937 12-11-20 2143  Weight: 72.6 kg 74.7 kg    Estimated body mass index is 26.58 kg/m as calculated from the following:   Height as of this  encounter: 5\' 6"  (1.676 m).   Weight as of this encounter: 74.7 kg.  LABS: CBC:    Component Value Date/Time   WBC 10.7 (H) 11/17/2020 0500   HGB 11.7 (L) 11/17/2020 0500   HGB 10.8 (L) 09/18/2020 0814   HGB 11.3 (L) 11/09/2017 1214   HCT 35.3 (L) 11/17/2020 0500   HCT 35.3 11/09/2017 1214   PLT 271 11/17/2020 0500   PLT 351 09/18/2020 0814   PLT 231 11/09/2017 1214   Comprehensive Metabolic Panel:    Component Value Date/Time   NA 141 11/17/2020 0500   NA 142 11/09/2017 1214   K 3.7 11/17/2020 0500   K 3.9 11/09/2017 1214   BUN 13 11/17/2020 0500   BUN 19.1 11/09/2017 1214   CREATININE 0.80 11/17/2020 0500   CREATININE 0.86 09/18/2020 0814   CREATININE 1.0 11/09/2017 1214   ALBUMIN 3.4 (L) 11/17/2020 0500   ALBUMIN 3.5 11/09/2017 1214     Review of Systems  Unable to perform ROS:  Acuity of condition   The above conversation was completed via telephone. Thorough chart review and discussion with necessary members of the care team was completed as part of assessment. All issues were discussed and addressed but no physical exam was performed.  Prognosis: Hours-days  Discharge Planning:  Anticipated Hospital Death  Recommendations: . DNR/DNI-as confirmed by family . Transition all care to focus on comfort.  Morphine PRN for pain/air hunger/comfort Robinul PRN for excessive secretions Spiritual support for EOL care Ativan PRN for agitation/anxiety Zofran PRN for nausea Liquifilm tears PRN for dry eyes Haldol PRN for agitation/anxiety May have comfort feeding Comfort cart for family Unrestricted visitations in the setting of EOL (per policy) Oxygen PRN 2L or less for comfort. No escalation.  Marland Kitchen PMT will continue to support and follow. Please call team line with urgent needs.   Palliative Performance Scale: PPS 10%               Family expressed understanding and was in agreement with this plan.   Thank you for allowing the Palliative Medicine Team to assist  in the care of this patient.  Time In: 0830 Time Out: 0920 Time Total: 50 min.   Visit consisted of counseling and education dealing with the complex and emotionally intense issues of symptom management and palliative care in the setting of serious and potentially life-threatening illness.Greater than 50%  of this time was spent counseling and coordinating care related to the above assessment and plan.  Signed by:  Alda Lea, AGPCNP-BC Palliative Medicine Team  Phone: [phone] Pager: [phone] Amion: Bjorn Pippin

## 2020-11-19 NOTE — Progress Notes (Signed)
Patient's daughter [name] updated on patients declinging respiratory status. Dr. Hayes Ludwig paged and asked to contact daughter for an update as well. Palliative MD paged and made aware of patients status.  Clyde Canterbury, RN

## 2020-11-19 NOTE — Progress Notes (Signed)
Subjective: Has had worsening mental status and now also requiring oxygen  ROS: Unable to obtain due to poor mental status  Examination  Vital signs in last 24 hours: Temp:  [98 F (36.7 C)-98.6 F (37 C)] 98.3 F (36.8 C) (01/13 0305) Pulse Rate:  [80-106] 106 (01/13 0849) Resp:  [18-61] 61 (01/13 0849) BP: (89-145)/(62-79) 89/62 (01/13 0849) SpO2:  [76 %-98 %] 76 % (01/13 0849)  General: lying in bed, on oxygen CVS: pulse-normal rate and rhythm RS: Coarse breath sounds bilaterally Extremities: normal  Neuro: Barely opens eyes to stimuli, does not follow commands, left gaze preference, barely withdraws to noxious stimuli in all 4 extremities but does moan  Basic Metabolic Panel: Recent Labs  Lab 11/30/2020 1000 2020/11/30 1538 11/17/20 0500  NA 142  --  141  K 3.3*  --  3.7  CL 109  --  106  CO2 23  --  23  GLUCOSE 98  --  138*  BUN 25*  --  13  CREATININE 0.90  --  0.80  CALCIUM 8.2*  --  9.2  MG  --  1.6*  --   PHOS  --   --  2.9    CBC: Recent Labs  Lab 11/30/20 1000 11/17/20 0500  WBC 9.0 10.7*  NEUTROABS 6.8  --   HGB 10.4* 11.7*  HCT 32.8* 35.3*  MCV 90.9 87.4  PLT 218 271     Coagulation Studies: No results for input(s): LABPROT, INR in the last 72 hours.  Imaging No new brain imaging overnight  ASSESSMENT AND PLAN: 1 old female with known brain metastasis in the right temporal and frontal area from primary breast cancer presented with altered mental status and was noted to have multiple seizures.  Status epilepticus (resolved) Acute encephalopathy, secondary to seizures, brain mets Breast cancer with metastatic brain disease Cerebral edema from brain mets -Seizures due to underlying metastatic disease -LTM EEG showed periodic and rhythmic sharp waves in the right frontal region which is on the ictal-interictal continuum.  Recommendations -Patient already on 3 AEDs.  Adding more AEDs will likely worsen patient's current mentation.    -Palliative care team spoke with patient's family and plan is to transition to [name] care which I agree is appropriate at this point  - Discontinue LTM EEG as patient has been transition to [name] care -Patient's family at bedside including daughters and son and grandchildren requested to also speak with Dr. [name].  I contacted Dr. [name] who will see the patient and speak with patient's family -Okay to continue seizure medications for now as seizures can be uncomfortable as well -As needed IV Ativan 2 mg for clinical seizure-like activity lasting more than 2 minutes  I have spent a total of 30  minuteswith the patient reviewing hospitalnotes,  test results, labs and examining the patient as well as establishing an assessment and plan that was discussed personally with the patient's family at bedside.>50% of time was spent in direct patient care.  [name] Epilepsy [name] For questions after 5pm please refer to AMION to reach the Neurologist on call

## 2020-11-19 NOTE — Progress Notes (Signed)
Called to patient's room by patient's primary RN Juhi as patient respirations have ceased. Patient is unresponsive and has no visible respirations or palpable pulse. Auscultated for breath and heart sounds and none were heard. Lurline.Ferrara RN also checked for heart and liungs sounds and found none to be present. MD notified by Early Osmond and he said two nurses may pronounce death.

## 2020-11-19 NOTE — Progress Notes (Addendum)
Daughter requested for this RN to check on mother. Breath sounds were not heard. This RN checked for lung sounds and radial pulse. None detected at this time. Ivin Booty, Agricultural consultant verified as 2nd Therapist, sports. MD notified and gave orders to pronounce time of death with 2 RNs. Time of death 11-24-20 1999/02/13. Pt's family in the room at this time. Will continue to monitor them.    Fransico Michael, RN

## 2020-11-19 NOTE — Progress Notes (Addendum)
I called the patient's daughter several times at [phone], there was no answer. I called [phone], there was no answer.   I called Darlene, the patient's daughter-in-law who is married to [place], the patient's son. Darlene said she would notify [name] that the patient is not doing well and may be pass today. Carlyon Shadow will contact the other family members and may have 6 visitors come in today. She gave me the number for the patient's other daughter, Festus Barren at [phone] called this number but there was no answer.   I called the patient's son this morning as well, there was no answer.   I called the patient's husband and next-of-kin, Mr. Maia Petties. He reported that is a Administrator and currently in [place]. He verified that he was the next-of-kin. We discussed the patient's prognosis and that she may be actively passing today. At this time, comfort care measures was ordered after he verbalized understanding of the patient's status. Mr. Maia Petties reports that the patient has 4 children, 2 sons and 2 daughters and that he would notify all of them via a family group chat.   The patient's bedside nurse was notified. Comfort care orders have been placed.

## 2020-11-19 NOTE — Progress Notes (Signed)
PROGRESS NOTE  [name] LKG:[id number] DOB: 1955/12/17 DOA: 11/10/2020 PCP: Patient, No Pcp Per  Brief History   65 year old woman PMH metastatic breast cancer, DVT on anticoagulation, presenting with altered mental status and progressive weakness to the point where she was unable to walk.  Recent MRI showed increased brain mets with vasogenic edema.  Neurology, neurooncology were consulted.  Patient developed seizures and was transferred to [facility] for continuous EEG given concern for status epilepticus.  On 1/13 Patient with worsening respiratory status, patient transitioned to comfort care.    A & P  Status epilepticus, new seizure disorder with acute encephalopathy secondary to breast cancer metastatic to the brain with associated bilateral lower extremity weakness --Neurology following, currently on Keppra, Dilantin  -Dose of Keppra increased and started on phenobarbital yesterday  -MRI brain notes progression of metastatic lesions and vasogenic edema -Continue dexamethasone -Appreciate oncology input, Dr.[name] recommended palliative referral, which seems appropriate at this time, discussed poor prognosis with pts son today.  1/13 Patient now with worsening respiratory status, transitioned to comfort care.   Metastatic breast cancer Multiple brain metastasis -Treated with brain radiation from 09/2019 -10/2019 -Reportedly supposed to be on immunotherapy with tucatinib -Appreciate oncology input from Dr. Jana Hakim today, will involve palliative care 1/13 Palliative care following. Patient no longer able to swallow secretions leading to respiratory failure. Patient transitioned to comfort care.   Acute respiratory failure with hypoxia Due to progression of multiple brain metastasis and dysphagia.  Patient no able to swallow secretions, placed on 15L [facility] and NRB mask.  Started on comfort care measures, robinul, morphine as needed.   History of right arm DVT -Completed  anticoagulation with Coumadin in 2014 -Dopplers negative for DVT this admission  Chronic pain  -Methadone was held on admission due to lethargy and intermittently being n.p.o. 1/13 Patient on IV morphine as needed now as part of comfort care orders.  DVT prophylaxis:     Code Status: DNR Family Communication: Discussed with the patient's 2 daughters, son, daughter-in-law and other family members present at the bedside. Discussed with the patient's husband over the phone.   appropriate due to severity of illness   Dispo: The patient is from: Home              Anticipated d/c is to: TBD, question if family would consider hospice but at this time it is unclear if the patient would be stable to transfer to hospice.               Anticipated d/c date is: > 3 days              Patient currently is not medically stable to d/c.    11/19/2020, 7:05 PM  LOS: 3 days     Consults:  . Neurology . Neurooncology   Interval History/Subjective  -Patient with worsening dysphagia and respiratory failure. Unresponsive, with tachypnea at times. Under comfort care now with family visiting at the bedside.   Objective   Vitals:  Vitals:   11/19/20 0400 11/19/20 0849  BP: 140/63 (!) 89/62  Pulse:  (!) 106  Resp: 18 (!) 61  Temp:    SpO2: 92% (!) 76%    Exam: Gen: Chronically ill female, in mild distress due to tachypnea. Does not open her eyes, non verbal.  CVS: S1-S2 present, mild tachycardia Lungs: Poor air movement anteriorly, rapid rate Abdomen: Soft, nontender, NT Extremities: No edema Neuro: Alert but not following commands, normal tone  I have personally reviewed  the following:   Today's Data  . CBG, CMP, CBC unremarkable  Scheduled Meds: . Chlorhexidine Gluconate Cloth  6 each Topical Daily  . dexamethasone (DECADRON) injection  4 mg Intravenous Q6H  . PHENObarbital  65 mg Intravenous BID  . phenytoin (DILANTIN) IV  100 mg Intravenous Q8H  . scopolamine  1 patch Transdermal  Q72H   Continuous Infusions: . levETIRAcetam 1,500 mg (11/19/20 0854)    Principal Problem:   Status epilepticus ([place]) Active Problems:   Chronic pain   Acute metabolic encephalopathy   Cancer of right breast metastatic to brain ([place])   Seizures ([place])   LOS: 3 days    Time spent: 60 minutes  Blain Pais, MD   [facility]

## 2020-11-19 NOTE — Progress Notes (Signed)
vLTM EEG complete. Nursing took leads off. No skin breakdown.

## 2020-11-19 NOTE — Procedures (Signed)
Patient Name:[name] ZOX:[id number] Epilepsy Attending:Tobenna Needs [name] Referring Physician/Provider:Dr Lesleigh Noe Duration:11/19/2020 0157 to 11/19/2020 8119  Patient history:65 year old female with known brain metastasis presented with altered mental status. EEG to evaluate for seizures.  Level of alertness:Awake,sleep  AEDs during EEG study:Keppra, Dilantin, Phenobarb  Technical aspects: This EEG study was done with scalp electrodes positioned according to the 10-20 International system of electrode placement. Electrical activity was acquired at a sampling rate  of 500Hz  and reviewed with a high frequency filter of 70Hz  and a low frequency filter of 1Hz . EEG data were recorded con tinuously and digitally stored.   Description:EEG initially showed continuous 3 to 6 Hz theta-delta slowing in therightfronto-centro-temporal region. Sharp waves were noted in right fronto-centrotemporal region.  Gradually the sharp waves became periodic at 0.5 to 1 Hz and rhythmic without definite evolution.  ABNORMALITY -Sharp waves,rightfronto-centro-temporal region -Continuous slow,right fronto-centro-temporal region  IMPRESSION: This studyinitially showed evidence of epileptogenicity and cortical dysfunction arising from right frontocentral region suggestive of underlying structural abnormality. Gradually the sharp waves became periodic and rhythmic without definite evolution which is on the ictal-interictal continuum with high concern for seizures.  [name] [name]

## 2020-11-19 NOTE — Progress Notes (Signed)
   11/19/20 1005  Clinical Encounter Type  Visited With Patient and family together  Visit Type Patient actively dying  Referral From Nurse  Consult/Referral To Chaplain  Spiritual Encounters  Spiritual Needs Prayer;Emotional  Stress Factors  Family Stress Factors Major life changes;Loss  Chaplain received a phone call from 4E that patient in 4E08 is actively dying.  Chaplain responded to the consult and phone call.  Visited with the family and said a prayer.  The family was calm and just sitting quietly with Ms. Komatsu at her bed side and playing gospel music.  I explained I will not hover but give them their time with Ms. Sharrow and if they need me to return please have the nurse to page the chaplain.  Chaplain Maddilyn Campus Morgan-Simpson  [phone]

## 2020-11-29 ENCOUNTER — Other Ambulatory Visit: Payer: Self-pay | Admitting: Oncology

## 2020-12-08 NOTE — Discharge Summary (Signed)
Physician Discharge Summary  Maizee Sherrer KPT:[id number] DOB: Apr 29, 1956 DOA: 11-19-2020   DEATH SUMMARY   PCP: Patient, No Pcp Per  Admit date: 11/19/20 Discharge date: 11/27/2020  Admitted From: Home Discharge Condition: Patient expired with time of death on 02-25-2199 on Nov 22, 2020  CODE STATUS: Comfort Care  Brief/Interim Summary: Summary: Palliative care consulted, her family notified of the decline in her condition and respiratory distress. The patient was transitioned to comfort care. Her family was at the bedside at the time of her passing.   Please see below for further details for this hospital course:   Status epilepticus, new seizure disorder with acute encephalopathy secondary to breast cancer metastatic to the brain with associated bilateral lower extremity weakness --Neurology following, currently on Keppra, Dilantin  -Dose of Keppra increased and started on phenobarbital yesterday  -MRI brain notes progression of metastatic lesions and vasogenic edema -Continue dexamethasone -Appreciate oncology input, Dr.[name] recommended palliative referral, which seems appropriate at this time, discussed poor prognosis with pts son today.  2022-11-22 Patient now with worsening respiratory status, transitioned to comfort care.   Metastatic breast cancer Multiple brain metastasis -Treated with brain radiation from 09/2019 -10/2019 -Reportedly supposed to be on immunotherapy with tucatinib -Appreciate oncology input from Dr. Darnelle Catalan today, will involve palliative care 22-Nov-2022 Palliative care following. Patient no longer able to swallow secretions leading to respiratory failure. Patient transitioned to comfort care.   Acute respiratory failure with hypoxia Due to progression of multiple brain metastasis and dysphagia.  Patient no able to swallow secretions, placed on 15L [place] and NRB mask.  Started on comfort care measures, robinul, morphine as needed.   History of right arm DVT -Completed  anticoagulation with Coumadin in February 25, 2013 -Dopplers negative for DVT this admission  Chronic pain  -Methadone was held on admission due to lethargy and intermittently being n.p.o. 2022-11-22 Patient on IV morphine as needed now as part of comfort care orders.     Code Status: DNR Family Communication: Discussed with the patient's 2 daughters, son, daughter-in-law and other family members present at the bedside. Discussed with the patient's husband over the phone.    Discharge Diagnoses:  Principal Problem:   Status epilepticus (HCC) Active Problems:   Chronic pain   Acute metabolic encephalopathy   Cancer of right breast metastatic to brain (HCC)   Seizures [facility])    Discharge Instructions  Patient expired, N/A    Allergies  Allergen Reactions  . Tramadol Nausea Only  . Hydrocodone-Acetaminophen Itching and Rash  . Pork-Derived Products Other (See Comments)    Pt says she just doesn't eat pork; has no reaction to pork products    Consultations:  Neurology  Palliative Care   Procedures/Studies: CT Head Wo Contrast  Result Date: 11/19/20 CLINICAL DATA:  Dizziness, non-specific Mental status change, unknown cause weakness EXAM: CT HEAD WITHOUT CONTRAST TECHNIQUE: Contiguous axial images were obtained from the base of the skull through the vertex without intravenous contrast. COMPARISON:  11/13/2020 and prior. FINDINGS: Brain: Heterogenous right frontal and right temporal masses are better demonstrated on recent MRI. Redemonstration of right cerebral edema. Confluent left cerebral hypodense foci involving the supratentorial white matter. Leftward midline shift of 5 mm, unchanged. No ventriculomegaly or extra-axial fluid collection. Vascular: No hyperdense vessel or unexpected calcification. Skull: Negative for fracture or focal lesion. Sinuses/Orbits: Normal orbits. Clear paranasal sinuses. No mastoid effusion. Other: None. IMPRESSION: Grossly unchanged appearance of hemorrhagic  right cerebral metastases, perilesional edema and 5 mm leftward midline shift. No acute intracranial process. Electronically  Signed   By: Primitivo Gauze [name]   On: 12/01/2020 11:18   MR BRAIN W WO CONTRAST  Result Date: 11/13/2020 CLINICAL DATA:  Metastatic breast cancer. Follow-up response to treatment EXAM: MRI HEAD WITHOUT AND WITH CONTRAST TECHNIQUE: Multiplanar, multiecho pulse sequences of the brain and surrounding structures were obtained without and with intravenous contrast. CONTRAST:  44mL MULTIHANCE GADOBENATE DIMEGLUMINE 529 MG/ML IV SOLN COMPARISON:  MRI head 09/22/2020 FINDINGS: Brain: Progression of large enhancing mass occupying most of the right temporal lobe. This lesion now measures approximately 3.4 x 7.1 x 2.3 cm. There is progression of extensive edema in the right temporal lobe extending into the frontal and parietal lobes. Increased mass-effect and midline shift. Midline shift now 5 mm. Right frontal cystic metastasis also with progression. There is progression of the enhancing component of the mass as well as progressive enhancement of the cyst wall and adjacent satellite nodules. The primary enhancing mass lesion measures 30 x 20 mm. Surrounding edema similar. No new lesions. Hyperintensity in the cerebral white matter bilaterally most consistent with prior radiation. Negative for acute infarct. Chronic hemorrhage in the mass lesions in the right temporal and right frontal lobe. Vascular: Normal arterial flow voids Skull and upper cervical spine: Heterogeneous calvarium suggesting metastatic disease. However no focal lesion identified. No interval change. Sinuses/Orbits: Paranasal sinuses clear.  Negative orbit Other: None IMPRESSION: Interval progression of large mass lesion in the right temporal lobe with surrounding edema compatible with tumor progression. Progression of right frontal cystic metastasis. Increased mass-effect and 5 mm midline shift to the left compared to the  prior study. Electronically Signed   By: Franchot Gallo [name]   On: 11/13/2020 16:09   DG Chest Port 1 View  Result Date: 11/11/2020 CLINICAL DATA:  Weakness today.  History of breast cancer. EXAM: PORTABLE CHEST 1 VIEW COMPARISON:  Single-view of the chest 08/16/2020. FINDINGS: The lungs are clear. Heart size is normal. No pneumothorax or pleural effusion. Port-A-Cath and surgical clips in the right axilla are unchanged. No acute bony abnormality. IMPRESSION: No acute disease. Electronically Signed   By: Inge Rise [name]   On: 11/10/2020 10:17   EEG LTVM - Continuous Bedside W/ Video Includes Portable EEG Read  Result Date: 11/17/2020 Lora Havens, MD     11/18/2020 10:03 AM Patient Name: Teria Toothaker MRN: [id number] Epilepsy Attending: Lora Havens Referring Physician/Provider: Dr Lesleigh Noe Duration: 11/17/2020 0157 to 11/18/2020 0157 Patient history: 65 year old female with known brain metastasis presented with altered mental status.  EEG to evaluate for seizures. Level of alertness: Awake, sleep AEDs during EEG study: Keppra, Dilantin, Phenobarb Technical aspects: This EEG study was done with scalp electrodes positioned according to the 10-20 International system of electrode placement. Electrical activity was acquired at a sampling rate of 500Hz  and reviewed with a high frequency filter of 70Hz  and a low frequency filter of 1Hz . EEG data were recorded continuously and digitally stored. Description: No clear posterior dominant rhythm was seen.  Sleep was characterized by vertex waves, sleep spindles (12 to 14 Hz), maximal frontocentral region.  EEG showed continuous 3 to 6 Hz theta-delta slowing in the right fronto-centro- temporal region.  Sharp waves were noted in right fronto-centro temporal region, at times periodic at 0.5 to 1 Hz.  Event button was pressed multiple times on 11/17/2020 between 0803 to 0838 during which patient was noted to have left gaze preference and left forward head  turn.  Concomitant EEG showed sharply contoured 4 to 5  Hz theta slowing which gradually evolved into 2 to 3 Hz delta slowing in the right frontocentral temporal region. Multiple seizures without clinical signs were also noted arising from right frontocentral temporal region, avg 10/hour, lasting about 30 seconds, last seizure at 1325. Parts of study were difficult to interpret due to significant electrode artifact between 11/15/2020 0157 to 11/17/2020 1103 and 11/17/2020 1434 to 1829. ABNORMALITY -Status epilepticus, right fronto-centro- temporal region -Sharp waves,right fronto-centro- temporal region -Continuous slow, right fronto-centro- temporal region IMPRESSION: This study initially showed status epilepticus with seizures arising from right frontocentral region, average 10 seizures per hour, lasting for 30 seconds, last seizure 1325.  During some of the seizures patient was noted to have left gaze deviation with left head turn but did not have any clinical signs during most other seizures.  There was also evidence of cortical dysfunction and epileptogenicity arising from right frontocentral region suggestive of underlying structural abnormality. Lora Havens   MR TOTAL SPINE METS SCREENING  Result Date: 11/18/2020 CLINICAL DATA:  Metastatic breast cancer. Intracranial metastasis. Rule out spinal metastasis. EXAM: MRI TOTAL SPINE WITHOUT   CONTRAST TECHNIQUE: Multisequence MR imaging of the spine from the cervical spine to the sacrum was performed without IV contrast administration for evaluation of spinal metastatic disease. COMPARISON:  MRI thoracic spine 02/11/2020 FINDINGS: Limited study. The patient was sedated but not able to hold still. There is extensive motion on the studies which are nearly nondiagnostic. No definite spinal metastasis.  No spinal fracture or mass lesion. Multiple cystic masses in the subcutaneous tissues of the thoracic back. These have progressed since 2021. These showed  hypermetabolic activity on prior PET 08/20/2019 therefore suspicious for metastatic disease with progression. No intravenous contrast administered. The study was terminated early due to extensive motion IMPRESSION: Image quality degraded by extensive motion. The study was terminated early and no intravenous contrast given. No definite spinal metastasis Enlarging cystic masses in the subcutaneous tissues of the upper back likely due to progressive metastatic disease. Electronically Signed   By: Franchot Gallo [name]   On: 11/18/2020 10:58   CT HEAD CODE STROKE WO CONTRAST  Result Date: 11/30/2020 CLINICAL DATA:  Code stroke. Altered mental status. History of breast cancer with brain metastases. EXAM: CT HEAD WITHOUT CONTRAST TECHNIQUE: Contiguous axial images were obtained from the base of the skull through the vertex without intravenous contrast. COMPARISON:  Head CT 11/29/2020 at 10:51 a.m. Head MRI 11/13/2020. FINDINGS: The study is mildly motion degraded. Brain: No acute cortically based infarct, acute intracranial hemorrhage, or extra-axial fluid collection is identified. Known metastases are again seen in the right temporal and right frontal lobes with extensive vasogenic edema in the right cerebral hemisphere, unchanged from today's earlier CT. Associated mass effect including partial effacement of the right lateral ventricle and 4 mm of leftward midline shift is unchanged. Hypodensities in the left cerebral hemispheric white matter are unchanged and may reflect the sequelae of radiation therapy. Vascular: No hyperdense vessel. Skull: No fracture or suspicious osseous lesion. Sinuses/Orbits: Trace left mastoid effusion. Clear paranasal sinuses. Unremarkable orbits. Other: None. ASPECTS [facility] Stroke Program Early CT Score) Not scored with this history. IMPRESSION: 1. Known right cerebral metastases with extensive vasogenic edema, unchanged from today's earlier CT. 2. No evidence of acute intracranial  abnormality. These results were communicated to Dr. Cherylann Ratel at 5:55 pm on 11/18/2020 by text page via the [facility] messaging system. Electronically Signed   By: Logan Bores [name]   On: 11/07/2020 17:55  VAS [name] LOWER EXTREMITY VENOUS (DVT) (ONLY [facility] & [facility])  Result Date: 12/05/2020  Lower Venous DVT Study Indications: Weakness.  Risk Factors: Cancer Breast Cancer with possible Brain Mets DVT Chart states Hx. Limitations: Patient having siezures. AMS and unable to follow commands. Comparison Study: 5 previous exams in 2021 for LE r/o DVT--Negative                   Previous Uppers also negative from 2014 thru 2021 Performing Technologist: Vonzell Schlatter RVT  Examination Guidelines: A complete evaluation includes B-mode imaging, spectral Doppler, color Doppler, and power Doppler as needed of all accessible portions of each vessel. Bilateral testing is considered an integral part of a complete examination. Limited examinations for reoccurring indications may be performed as noted. The reflux portion of the exam is performed with the patient in reverse Trendelenburg.  +---------+---------------+---------+-----------+----------+--------------+ RIGHT    CompressibilityPhasicitySpontaneityPropertiesThrombus Aging +---------+---------------+---------+-----------+----------+--------------+ CFV      Full           Yes      Yes                                 +---------+---------------+---------+-----------+----------+--------------+ SFJ      Full                                                        +---------+---------------+---------+-----------+----------+--------------+ FV Prox  Full                                                        +---------+---------------+---------+-----------+----------+--------------+ FV Mid   Full                                                        +---------+---------------+---------+-----------+----------+--------------+ FV DistalFull                                                         +---------+---------------+---------+-----------+----------+--------------+ PFV      Full                                                        +---------+---------------+---------+-----------+----------+--------------+ POP                                                   Not visualized +---------+---------------+---------+-----------+----------+--------------+ PTV      Full                                                        +---------+---------------+---------+-----------+----------+--------------+  PERO     Full                                                        +---------+---------------+---------+-----------+----------+--------------+   +---------+---------------+---------+-----------+----------+--------------+ LEFT     CompressibilityPhasicitySpontaneityPropertiesThrombus Aging +---------+---------------+---------+-----------+----------+--------------+ CFV      Full           Yes      Yes                                 +---------+---------------+---------+-----------+----------+--------------+ SFJ      Full                                                        +---------+---------------+---------+-----------+----------+--------------+ FV Prox  Full                                                        +---------+---------------+---------+-----------+----------+--------------+ FV Mid   Full                                                        +---------+---------------+---------+-----------+----------+--------------+ FV DistalFull                                                        +---------+---------------+---------+-----------+----------+--------------+ PFV      Full                                                        +---------+---------------+---------+-----------+----------+--------------+ POP      Full           Yes      Yes                                  +---------+---------------+---------+-----------+----------+--------------+ PTV      Full                                                        +---------+---------------+---------+-----------+----------+--------------+ PERO     Full                                                        +---------+---------------+---------+-----------+----------+--------------+   **  LEFT LEG DONE FIRST  Summary: RIGHT: - There is no evidence of deep vein thrombosis in the lower extremity. However, portions of this examination were limited- see technologist comments above.  - No cystic structure found in the popliteal fossa.  LEFT: - There is no evidence of deep vein thrombosis in the lower extremity.  - No cystic structure found in the popliteal fossa.  *See table(s) above for measurements and observations. Electronically signed by Jamelle Haring on 11/19/2020 at 7:19:35 PM.    Final       The results of significant diagnostics from this hospitalization (including imaging, microbiology, ancillary and laboratory) are listed below for reference.     Microbiology: No results found for this or any previous visit (from the past 240 hour(s)).   Labs: BNP (last 3 results) Recent Labs    01/21/20 0907 03/20/20 1145 11/13/2020 1000  BNP 27.8 23.5 0000000   Basic Metabolic Panel: No results for input(s): NA, K, CL, CO2, GLUCOSE, BUN, CREATININE, CALCIUM, MG, PHOS in the last 168 hours. Liver Function Tests: No results for input(s): AST, ALT, ALKPHOS, BILITOT, PROT, ALBUMIN in the last 168 hours. No results for input(s): LIPASE, AMYLASE in the last 168 hours. No results for input(s): AMMONIA in the last 168 hours. CBC: No results for input(s): WBC, NEUTROABS, HGB, HCT, MCV, PLT in the last 168 hours. Cardiac Enzymes: No results for input(s): CKTOTAL, CKMB, CKMBINDEX, TROPONINI in the last 168 hours. BNP: Invalid input(s): POCBNP CBG: No results for input(s): GLUCAP in the last 168 hours. D-Dimer No  results for input(s): DDIMER in the last 72 hours. Hgb A1c No results for input(s): HGBA1C in the last 72 hours. Lipid Profile No results for input(s): CHOL, HDL, LDLCALC, TRIG, CHOLHDL, LDLDIRECT in the last 72 hours. Thyroid function studies No results for input(s): TSH, T4TOTAL, T3FREE, THYROIDAB in the last 72 hours.  Invalid input(s): FREET3 Anemia work up No results for input(s): VITAMINB12, FOLATE, FERRITIN, TIBC, IRON, RETICCTPCT in the last 72 hours. Urinalysis    Component Value Date/Time   COLORURINE YELLOW 12/01/2020 1025   APPEARANCEUR CLEAR 11/15/2020 1025   LABSPEC 1.034 (H) 12/06/2020 1025   PHURINE 5.0 11/07/2020 1025   GLUCOSEU NEGATIVE 11/21/2020 1025   GLUCOSEU NEGATIVE 06/12/2020 1115   HGBUR NEGATIVE 11/10/2020 1025   BILIRUBINUR NEGATIVE 11/10/2020 1025   KETONESUR 5 (A) 12/07/2020 1025   PROTEINUR NEGATIVE 11/30/2020 1025   UROBILINOGEN 0.2 06/12/2020 1115   NITRITE NEGATIVE 11/19/2020 1025   LEUKOCYTESUR NEGATIVE 11/14/2020 1025   Sepsis Labs Invalid input(s): PROCALCITONIN,  WBC,  LACTICIDVEN Microbiology No results found for this or any previous visit (from the past 240 hour(s)).   Time coordinating discharge: Over 30 minutes  SIGNED:   Blain Pais, MD  [facility] 11/27/2020, 12:41 PM Pager   If 7PM-7AM, please contact night-coverage [email or web address] Password [email or web address]

## 2020-12-08 DEATH — deceased

## 2020-12-30 IMAGING — DX DG CHEST 1V PORT
1 series · 1 of 1 positions shown · non-contrast
Comparison: None.

CLINICAL DATA: Foot pain and swelling, breast cancer, DVT

EXAM:
PORTABLE CHEST 1 VIEW

[chest ap]
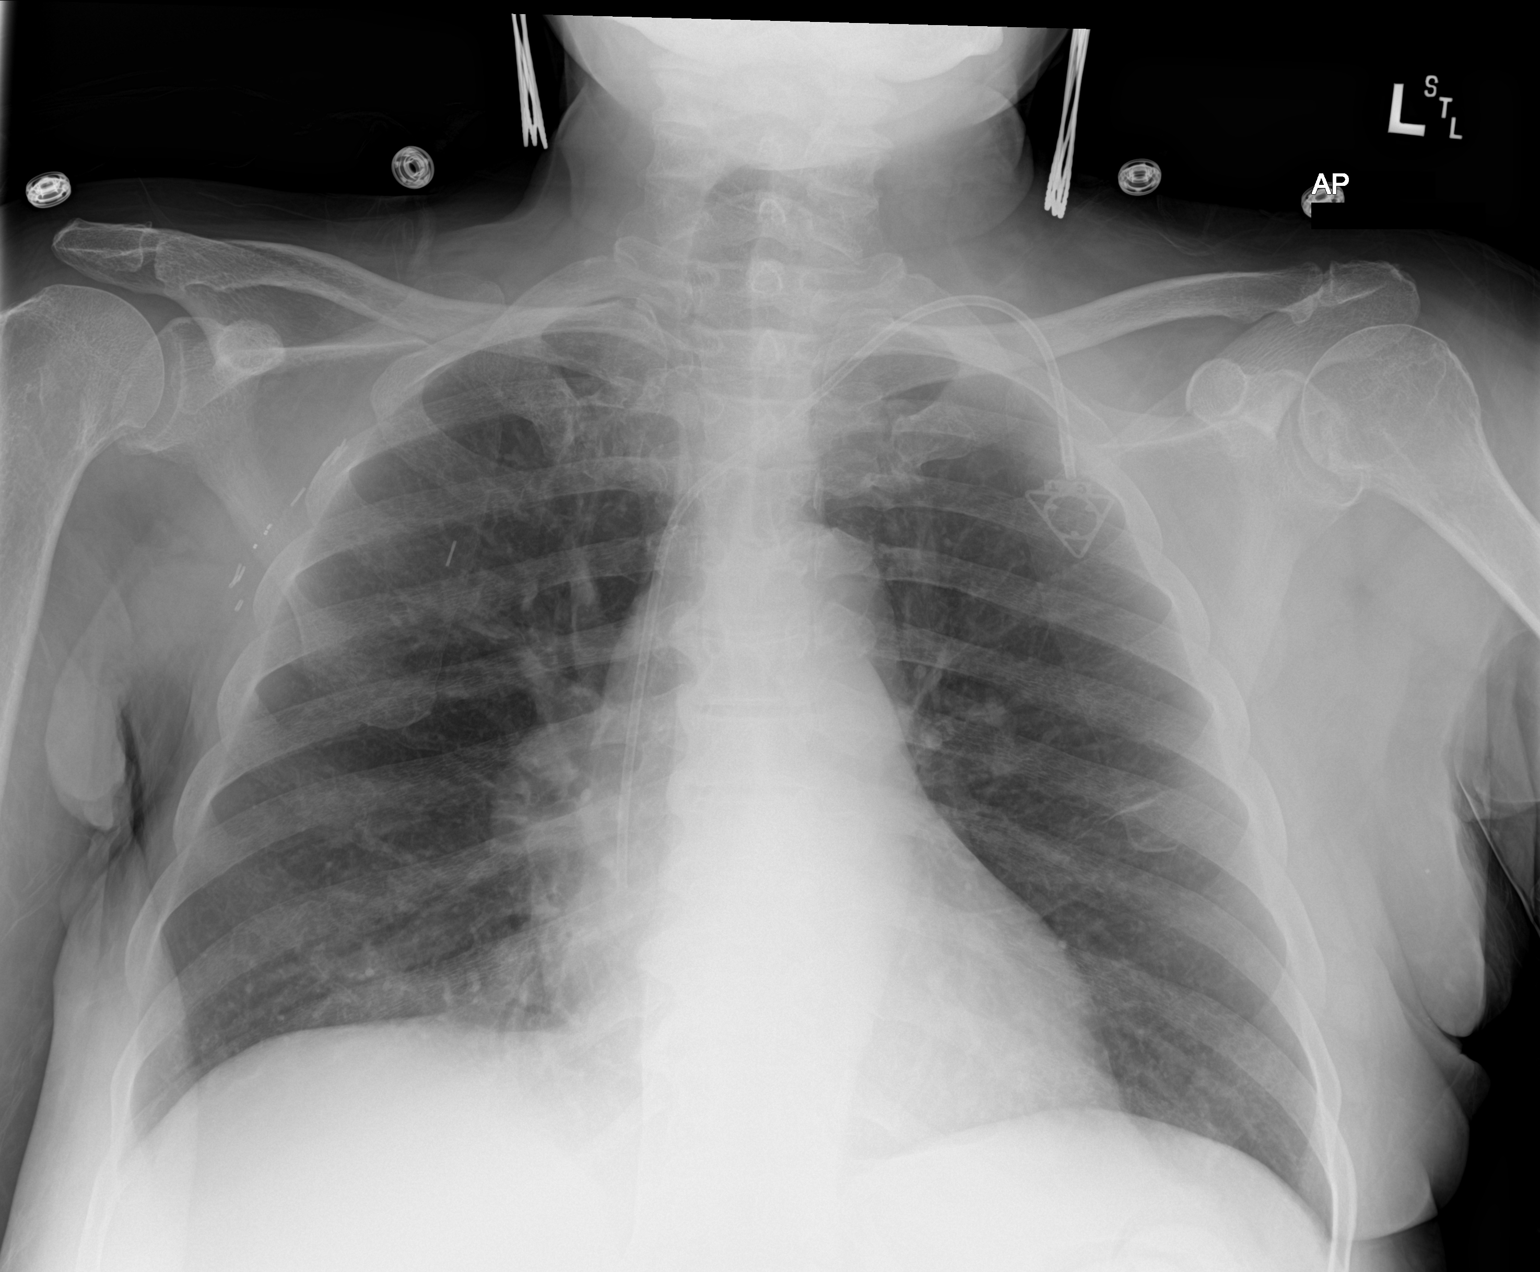

[1 of 1 positions shown; findings below may reference images not displayed]

FINDINGS: Lungs are clear. No pneumothorax or pleural effusion. Cardiac size
within normal limits. Pulmonary vascularity is normal. Left internal
jugular chest port is seen with its tip within the right atrium.
Surgical clips are seen within the right axilla. No acute bone
abnormality.
IMPRESSION: No active disease.

## 2021-02-05 IMAGING — MR MR HEAD WO/W CM
14 series · 43 of 48 positions shown · IV contrast (gadavist)
Comparison: Head CT 07/21/2020.  Brain MRI 03/20/2020 and earlier.
COMPARISON: Head CT 07/21/2020.  Brain MRI 03/20/2020 and earlier.

Addendum:
CLINICAL DATA: 64-year-old female with metastatic breast cancer.
Confusion. Restaging.

Status post whole brain radiation in [REDACTED] and October 2019.
EXAM:
MRI HEAD WITHOUT AND WITH CONTRAST
TECHNIQUE: Multiplanar, multiecho pulse sequences of the brain and surrounding
structures were obtained without and with intravenous contrast.
CONTRAST:  8mL GADAVIST GADOBUTROL 1 MMOL/ML IV SOLN

[Series 5: DWI · axial · 3.0mm · 1.36mm/px · z∈[-79,+79]mm · 6 of 107 slices shown (1 of 4)]
[im 1/107]
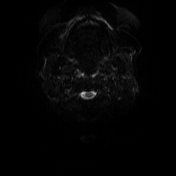
[im 22/107]
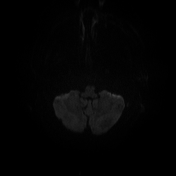
[im 43/107]
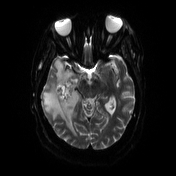
[im 64/107]
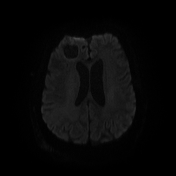
[im 85/107]
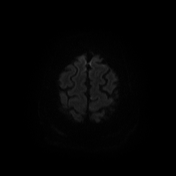
[im 107/107]
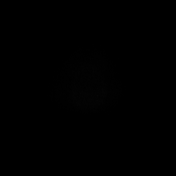

[Series 6: DWI · axial · 3.0mm · 1.36mm/px · z∈[-79,+79]mm · 3 of 52 slices shown (2 of 4)]
[im 1/52]
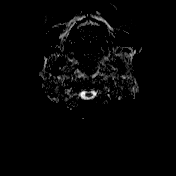
[im 26/52]
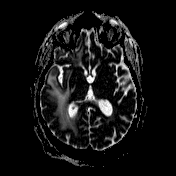
[im 52/52]
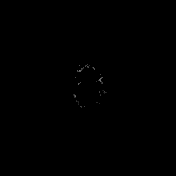

[Series 7: T1 · sagittal · 5.0mm · 0.75mm/px · 1 of 24 slices shown (1 of 2)]
[im 1/24]
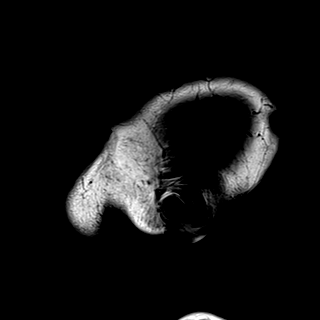

[Series 8: T2 · axial · 5.0mm · 0.62mm/px · 1 of 26 slices shown (1 of 2)]
[im 1/26]
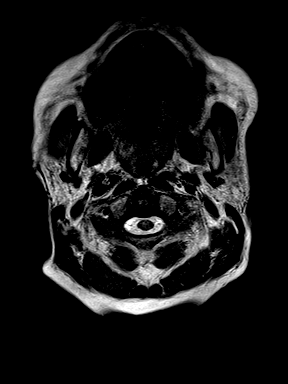

[Series 9: FLAIR · axial · 3.0mm · 0.75mm/px · z∈[-76,+77]mm · 3 of 52 slices shown]
[im 1/52]
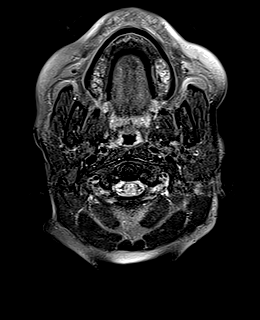
[im 26/52]
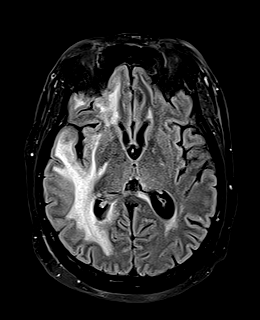
[im 52/52]
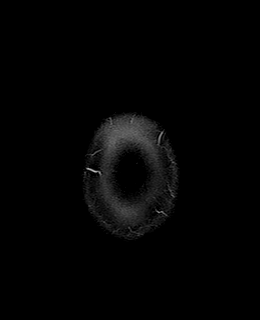

[Series 10: T2 · axial · 5.0mm · 0.45mm/px · z∈[-74,+94]mm · 2 of 27 slices shown (2 of 2)]
[im 1/27]
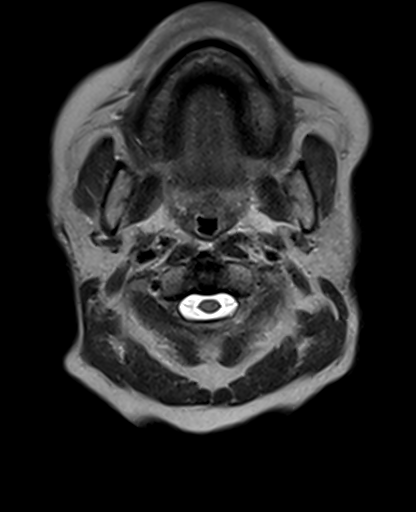
[im 27/27]
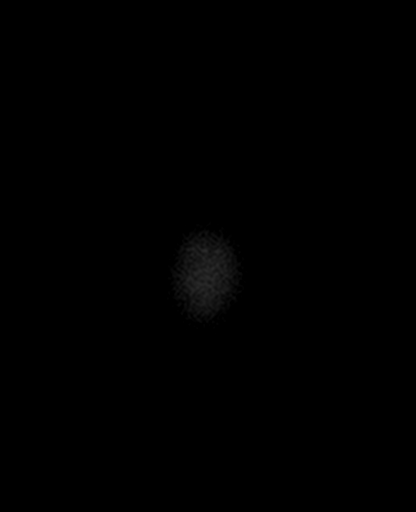

[Series 11: GRE · axial · 3.0mm · 0.45mm/px · z∈[-76,+83]mm · 3 of 54 slices shown]
[im 1/54]
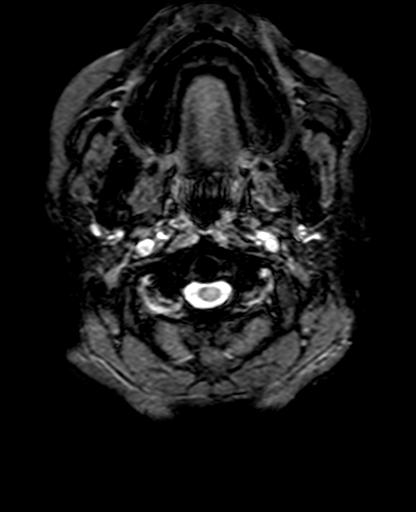
[im 27/54]
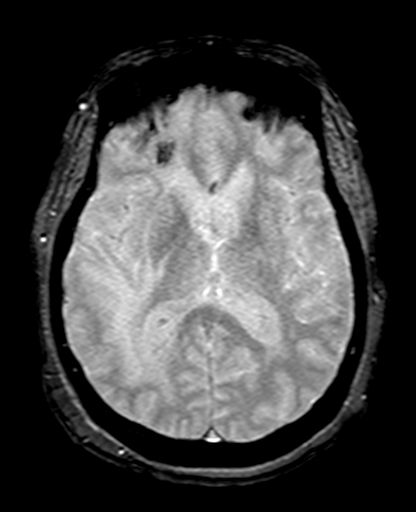
[im 54/54]
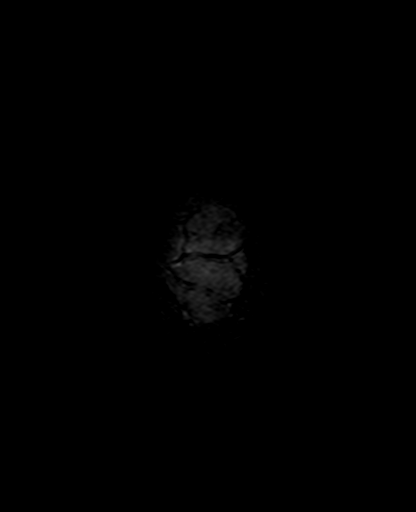

[Series 12: T1 · axial · 1.0mm · 0.94mm/px · z∈[-76,+22]mm · 5 of 160 slices shown (2 of 2)]
[im 1/160]
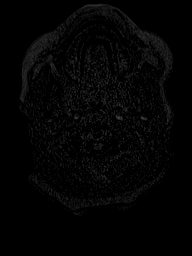
[im 20/160]
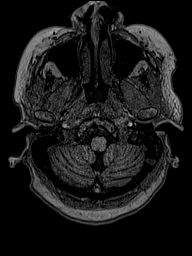
[im 40/160]
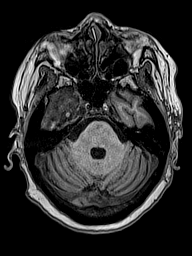
[im 60/160]
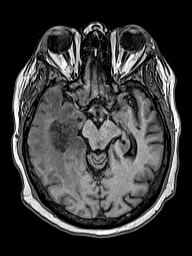
[im 100/160]
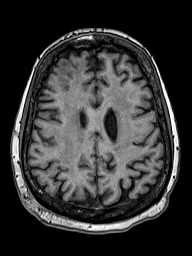

[Series 13: DWI · coronal · 5.0mm · 1.31mm/px · 4 of 76 slices shown (3 of 4)]
[im 1/76]
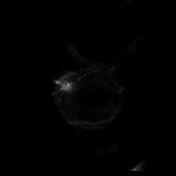
[im 26/76]
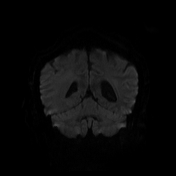
[im 51/76]
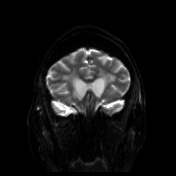
[im 76/76]
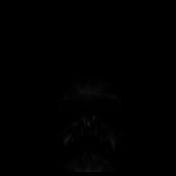

[Series 14: DWI · coronal · 5.0mm · 1.31mm/px · 2 of 38 slices shown (4 of 4)]
[im 1/38]
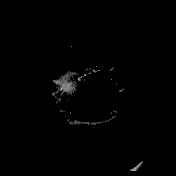
[im 38/38]
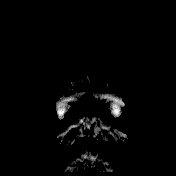

[Series 15: T2 post-contrast · coronal · 5.0mm · 0.57mm/px · 2 of 32 slices shown]
[im 1/32]
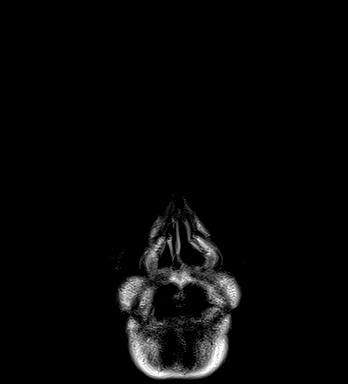
[im 32/32]
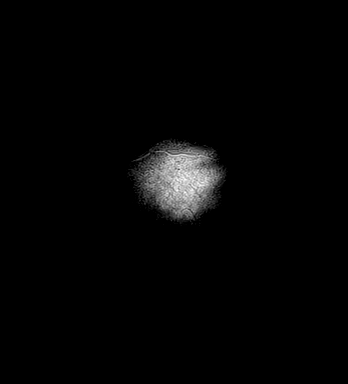

[Series 16: T1 post-contrast · axial · 1.0mm · 0.94mm/px · z∈[-76,+82]mm · 8 of 160 slices shown (1 of 3)]
[im 1/160]
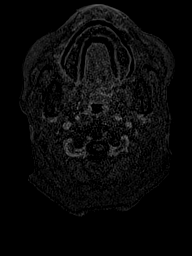
[im 20/160]
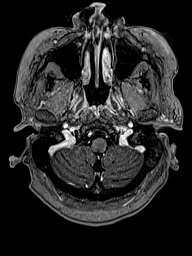
[im 40/160]
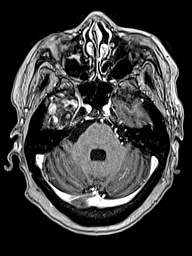
[im 60/160]
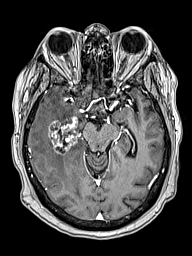
[im 100/160]
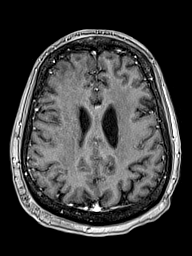
[im 120/160]
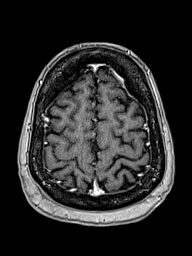
[im 140/160]
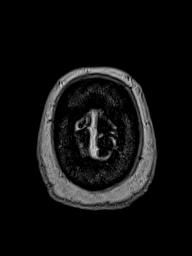
[im 160/160]
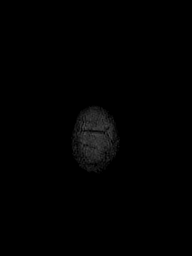

[Series 17: T1 post-contrast · coronal · 5.0mm · 0.43mm/px · 2 of 32 slices shown (2 of 3)]
[im 1/32]
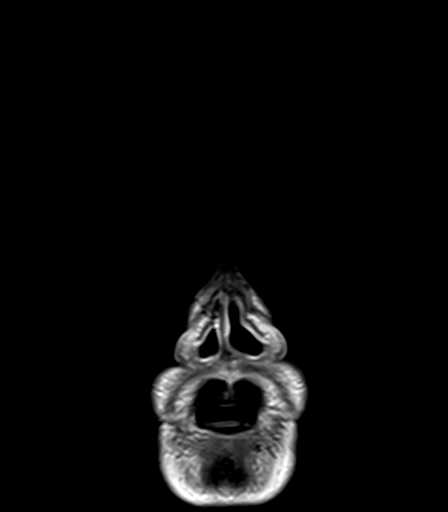
[im 32/32]
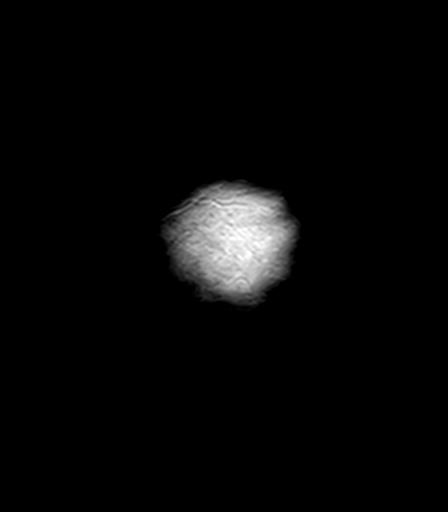

[Series 18: T1 post-contrast · sagittal · 5.0mm · 0.75mm/px · 1 of 24 slices shown (3 of 3)]
[im 1/24]
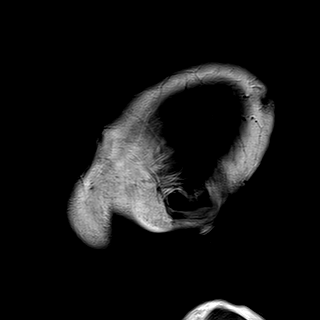

[43 of 48 positions shown; findings below may reference images not displayed]

FINDINGS: Brain:

Elongated, nodular and enhancing mass in the right mesial an
inferior temporal lobe extends to the surface of the brain as before
and now measures at least 58 mm AP (greater than 60 mm if slightly
discontinuous anterior temporal tip nodules are also included series
18, image 8) x 31 mm transverse x 24 mm CC. This compares to roughly
50 mm by 25 x 20 mm [REDACTED] are and [REDACTED]. Associated substantial
progression of T2 and FLAIR hyperintense confluent edema, which now
tracks throughout the right temporal lobe, newly into the right
occipital lobe, and cephalad into the deep white matter capsules
nearly contiguous with signal abnormality from the anterior right
frontal lobe lesion. Associated mild regional mass effect, including
on the atrium of the right lateral ventricle.

More heterogeneously enhancing, partially cystic and nodular right
anterior inferior frontal gyrus metastasis encompasses 30 x 24 x 26
mm (AP by transverse by CC) versus 28 x 21 x 28 mm when measured in
the same fashion in [REDACTED]. This lesion demonstrates nodular
enhancement primarily along the anterior and inferior margins.
Anterior left frontal lobe T2 and FLAIR hyperintense edema has also
increased since [DATE], image 30).

Both lesions demonstrate chronic blood products as before.

No new brain metastasis or new area of abnormal enhancement
identified. No dural thickening.

Progressive widespread cerebral white matter T2 and FLAIR
hyperintensity in both hemispheres without mass effect is compatible
with sequelae of whole brain radiation.

No superimposed restricted diffusion to suggest acute infarction. No
midline shift, ventriculomegaly, extra-axial collection or acute
intracranial hemorrhage. Cervicomedullary junction and pituitary are
within normal limits.

Vascular: Major intracranial vascular flow voids are stable. The
major dural venous sinuses are enhancing and appear to be patent.

Skull and upper cervical spine: Visible bone marrow signal appears
stable to improved since [REDACTED]. Heterogeneous signal in the
calvarium and cervical spine but no destructive osseous lesion is
identified.

Sinuses/Orbits: Stable, negative.

Other: Chronic left mastoid effusion is stable. Grossly normal
visible other internal auditory structures.

At the right posterior scalp convexity there is a new irregular
cm area of abnormal signal in the soft tissues, although no definite
enhancement. This might be posttraumatic hematoma.
IMPRESSION: 1. Better quality exam compared to May.  Progression of disease.
2. Enlarged nodular and enhancing right temporal lobe mass (now
cm or larger long axis) with substantially increased regional edema
and mild regional mass effect.
3. Modest enlargement also of the anterior right frontal lobe cystic
and solid mass (3.0 cm), with mild increased regional edema.
4. No new brain metastasis identified. Sequelae of whole brain
radiation.
5. New 2.5 cm area of abnormal signal in the right superior scalp
favored to be posttraumatic hematoma. Query recent fall. No discrete
underlying bone metastasis.

ADDENDUM:
Study discussed by telephone with Dr. NIKOLAY ISLA on 09/22/2020

And we discussed the possibility of radiation necrosis versus true
progression in the right temporal lobe. A trial of empiric medical
therapy for radiation necrosis may be valuable.

*** End of Addendum ***
FINDINGS: Brain:

Elongated, nodular and enhancing mass in the right mesial an
inferior temporal lobe extends to the surface of the brain as before
and now measures at least 58 mm AP (greater than 60 mm if slightly
discontinuous anterior temporal tip nodules are also included series
18, image 8) x 31 mm transverse x 24 mm CC. This compares to roughly
50 mm by 25 x 20 mm [REDACTED] are and [REDACTED]. Associated substantial
progression of T2 and FLAIR hyperintense confluent edema, which now
tracks throughout the right temporal lobe, newly into the right
occipital lobe, and cephalad into the deep white matter capsules
nearly contiguous with signal abnormality from the anterior right
frontal lobe lesion. Associated mild regional mass effect, including
on the atrium of the right lateral ventricle.

More heterogeneously enhancing, partially cystic and nodular right
anterior inferior frontal gyrus metastasis encompasses 30 x 24 x 26
mm (AP by transverse by CC) versus 28 x 21 x 28 mm when measured in
the same fashion in [REDACTED]. This lesion demonstrates nodular
enhancement primarily along the anterior and inferior margins.
Anterior left frontal lobe T2 and FLAIR hyperintense edema has also
increased since [DATE], image 30).

Both lesions demonstrate chronic blood products as before.

No new brain metastasis or new area of abnormal enhancement
identified. No dural thickening.

Progressive widespread cerebral white matter T2 and FLAIR
hyperintensity in both hemispheres without mass effect is compatible
with sequelae of whole brain radiation.

No superimposed restricted diffusion to suggest acute infarction. No
midline shift, ventriculomegaly, extra-axial collection or acute
intracranial hemorrhage. Cervicomedullary junction and pituitary are
within normal limits.

Vascular: Major intracranial vascular flow voids are stable. The
major dural venous sinuses are enhancing and appear to be patent.

Skull and upper cervical spine: Visible bone marrow signal appears
stable to improved since [REDACTED]. Heterogeneous signal in the
calvarium and cervical spine but no destructive osseous lesion is
identified.

Sinuses/Orbits: Stable, negative.

Other: Chronic left mastoid effusion is stable. Grossly normal
visible other internal auditory structures.

At the right posterior scalp convexity there is a new irregular
cm area of abnormal signal in the soft tissues, although no definite
enhancement. This might be posttraumatic hematoma.
IMPRESSION: 1. Better quality exam compared to May.  Progression of disease.
2. Enlarged nodular and enhancing right temporal lobe mass (now
cm or larger long axis) with substantially increased regional edema
and mild regional mass effect.
3. Modest enlargement also of the anterior right frontal lobe cystic
and solid mass (3.0 cm), with mild increased regional edema.
4. No new brain metastasis identified. Sequelae of whole brain
radiation.
5. New 2.5 cm area of abnormal signal in the right superior scalp
favored to be posttraumatic hematoma. Query recent fall. No discrete
underlying bone metastasis.

## 2021-03-29 IMAGING — MR MR HEAD WO/W CM
12 series · 48 of 48 positions shown · IV contrast (16ml Multihance)
Comparison: MRI head 09/22/2020

CLINICAL DATA: Metastatic breast cancer. Follow-up response to
treatment

EXAM:
MRI HEAD WITHOUT AND WITH CONTRAST
TECHNIQUE: Multiplanar, multiecho pulse sequences of the brain and surrounding
structures were obtained without and with intravenous contrast.
CONTRAST:  15mL MULTIHANCE GADOBENATE DIMEGLUMINE 529 MG/ML IV SOLN

[Series 2: FLAIR · sagittal · 3.0mm · 0.75mm/px · 3 of 39 slices shown (1 of 2)]
[im 1/39]
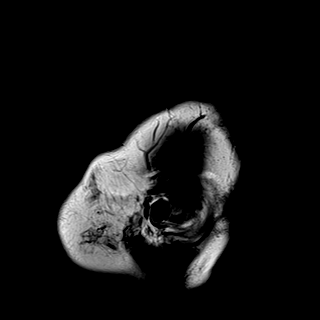
[im 20/39]
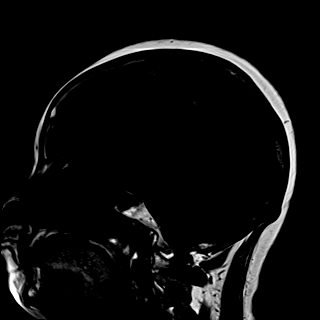
[im 39/39]
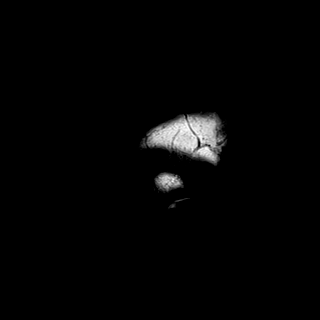

[Series 3: DWI · axial · 3.0mm · 1.50mm/px · z∈[-63,+89]mm · 4 of 80 slices shown (1 of 2)]
[im 1/80]
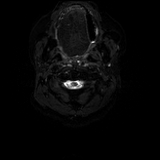
[im 27/80]
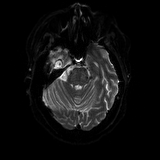
[im 53/80]
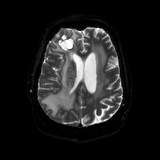
[im 80/80]
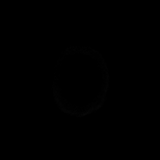

[Series 4: DWI · axial · 3.0mm · 1.50mm/px · z∈[-63,+89]mm · 2 of 40 slices shown (2 of 2)]
[im 1/40]
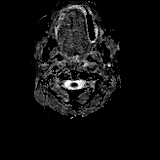
[im 40/40]
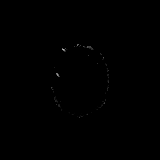

[Series 5: T2 · axial · 5.0mm · 0.57mm/px · 1 of 27 slices shown (1 of 2)]
[im 1/27]
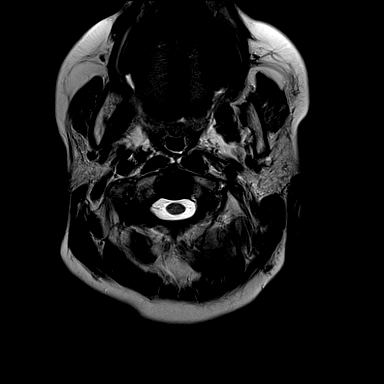

[Series 7: swi_images · axial · 1.5mm · 0.90mm/px · z∈[-46,+109]mm · 5 of 104 slices shown]
[im 1/104]
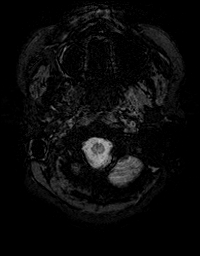
[im 26/104]
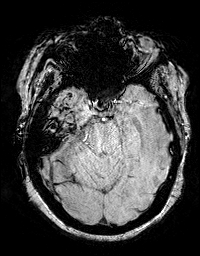
[im 52/104]
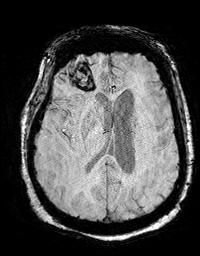
[im 78/104]
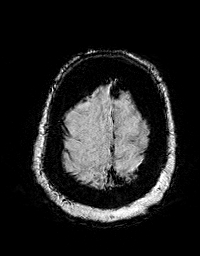
[im 104/104]
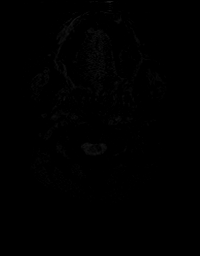

[Series 8: FLAIR · axial · 3.0mm · 0.86mm/px · z∈[-62,+109]mm · 3 of 57 slices shown (2 of 2)]
[im 1/57]
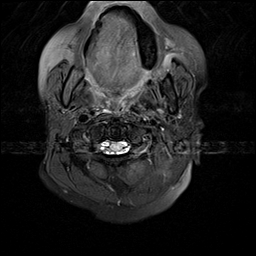
[im 29/57]
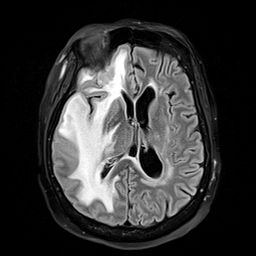
[im 57/57]
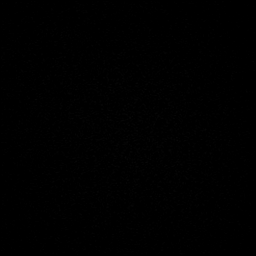

[Series 9: T2 · axial · non-contrast · 1.0mm · 0.86mm/px · z∈[-59,+113]mm · 8 of 176 slices shown (2 of 2)]
[im 1/176]
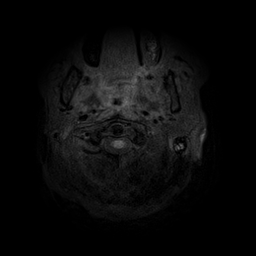
[im 26/176]
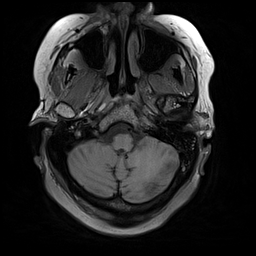
[im 51/176]
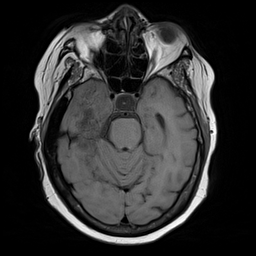
[im 76/176]
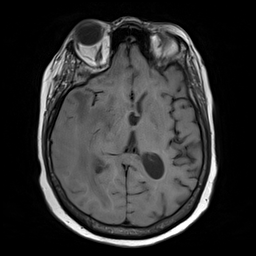
[im 101/176]
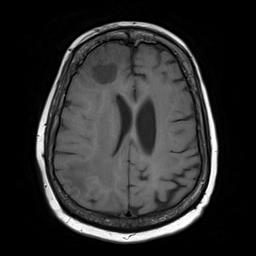
[im 126/176]
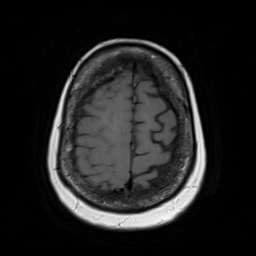
[im 151/176]
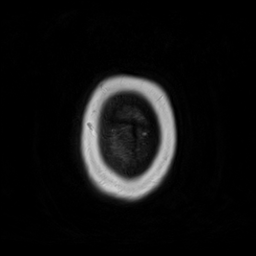
[im 176/176]
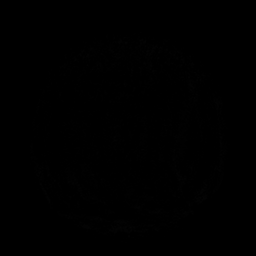

[Series 10: T2 post-contrast · coronal · 3.0mm · 0.57mm/px · 2 of 47 slices shown (1 of 2)]
[im 1/47]
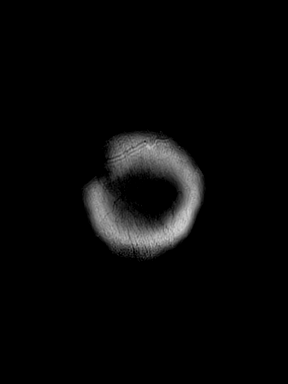
[im 47/47]
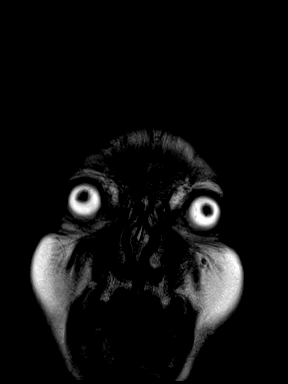

[Series 11: T2 post-contrast · axial · 1.0mm · 0.86mm/px · z∈[-59,+113]mm · 8 of 176 slices shown (2 of 2)]
[im 1/176]
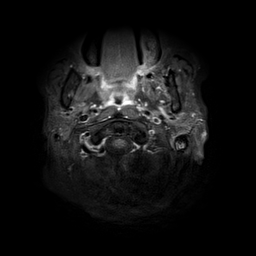
[im 26/176]
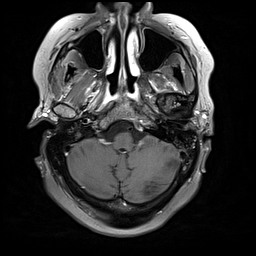
[im 51/176]
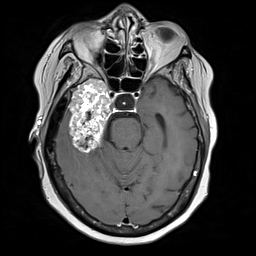
[im 76/176]
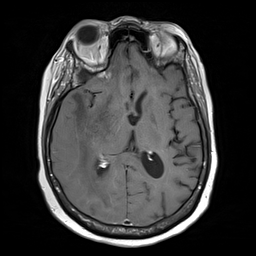
[im 101/176]
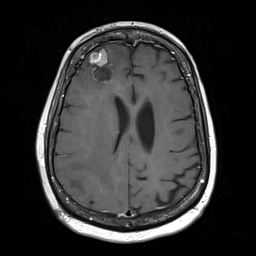
[im 126/176]
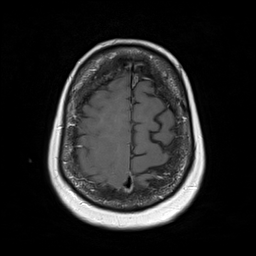
[im 151/176]
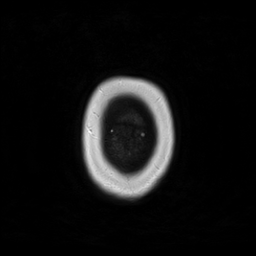
[im 176/176]
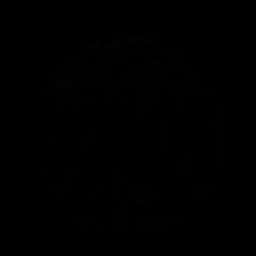

[Series 12: T1 post-contrast · axial · 1.0mm · 0.75mm/px · z∈[-63,+112]mm · 8 of 174 slices shown (1 of 2)]
[im 1/174]
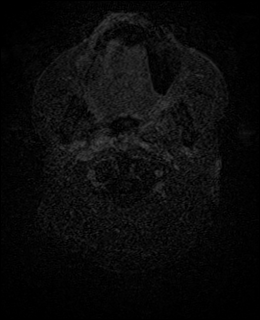
[im 25/174]
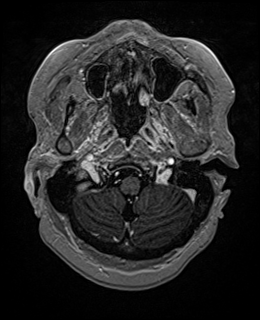
[im 50/174]
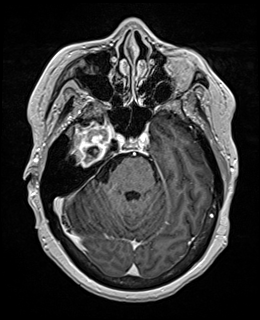
[im 75/174]
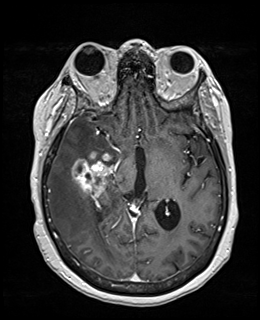
[im 99/174]
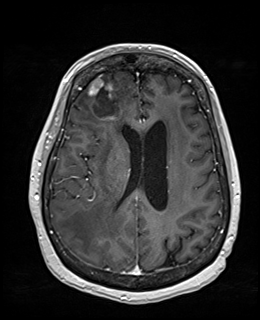
[im 124/174]
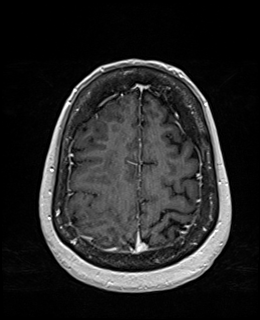
[im 149/174]
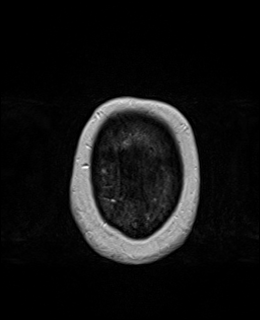
[im 174/174]
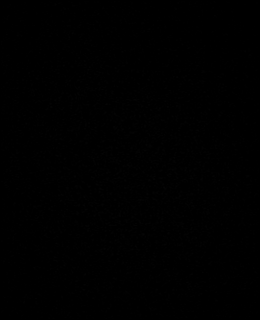

[Series 13: T1 post-contrast · coronal · 3.0mm · 0.57mm/px · 2 of 47 slices shown (2 of 2)]
[im 1/47]
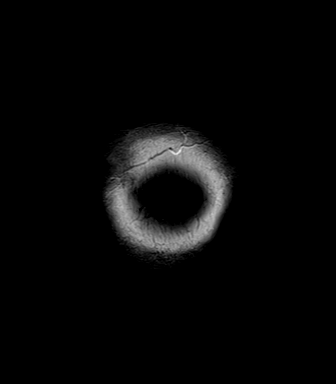
[im 47/47]
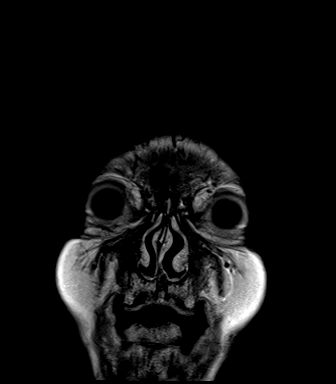

[Series 14: FLAIR post-contrast · sagittal · 3.0mm · 0.75mm/px · 2 of 39 slices shown]
[im 1/39]
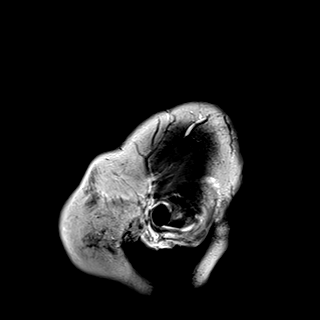
[im 39/39]
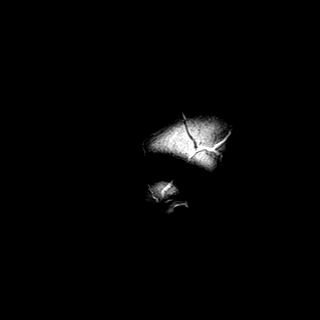

[48 of 48 positions shown; findings below may reference images not displayed]

FINDINGS: Brain: Progression of large enhancing mass occupying most of the
right temporal lobe. This lesion now measures approximately 3.4 x
7.1 x 2.3 cm. There is progression of extensive edema in the right
temporal lobe extending into the frontal and parietal lobes.
Increased mass-effect and midline shift. Midline shift now 5 mm.

Right frontal cystic metastasis also with progression. There is
progression of the enhancing component of the mass as well as
progressive enhancement of the cyst wall and adjacent satellite
nodules. The primary enhancing mass lesion measures 30 x 20 mm.
Surrounding edema similar.

No new lesions.

Hyperintensity in the cerebral white matter bilaterally most
consistent with prior radiation. Negative for acute infarct. Chronic
hemorrhage in the mass lesions in the right temporal and right
frontal lobe.

Vascular: Normal arterial flow voids

Skull and upper cervical spine: Heterogeneous calvarium suggesting
metastatic disease. However no focal lesion identified. No interval
change.

Sinuses/Orbits: Paranasal sinuses clear.  Negative orbit

Other: None
IMPRESSION: Interval progression of large mass lesion in the right temporal lobe
with surrounding edema compatible with tumor progression.

Progression of right frontal cystic metastasis.

Increased mass-effect and 5 mm midline shift to the left compared to
the prior study.

## 2021-04-01 IMAGING — CT CT HEAD CODE STROKE
3 of 4 series · 15 of 47 positions shown, 18 images · non-contrast
Comparison: Head CT 11/16/2020 at [DATE] a.m. Head MRI 11/13/2020.

CLINICAL DATA: Code stroke. Altered mental status. History of
breast cancer with brain metastases.

EXAM:
CT HEAD WITHOUT CONTRAST
TECHNIQUE: Contiguous axial images were obtained from the base of the skull
through the vertex without intravenous contrast.

[Series 5: head wo · axial · 0.51mm/px · z∈[-152,+3]mm · 9 of 37 slices shown, 12 images]
[im 3/37  brain]
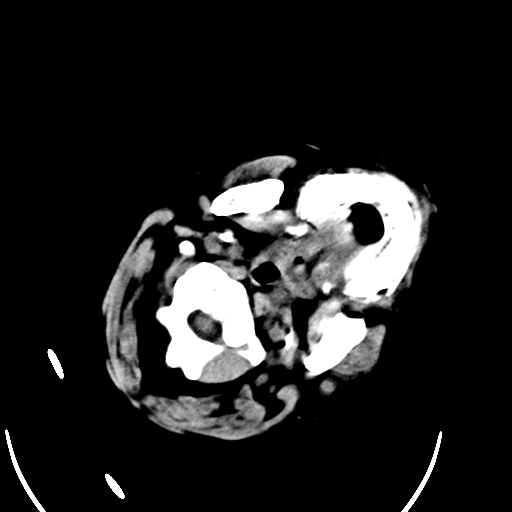
[im 3/37  bone]
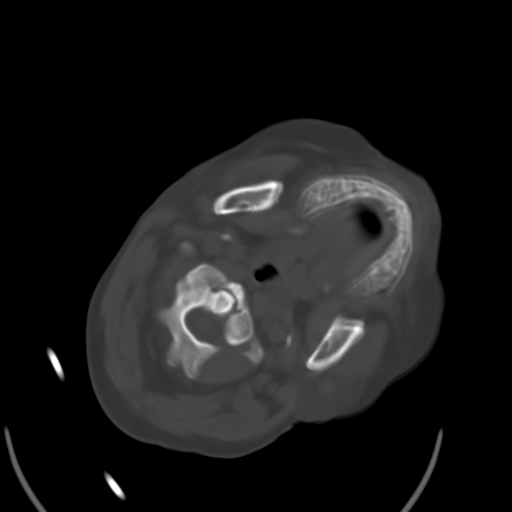
[im 8/37  brain]
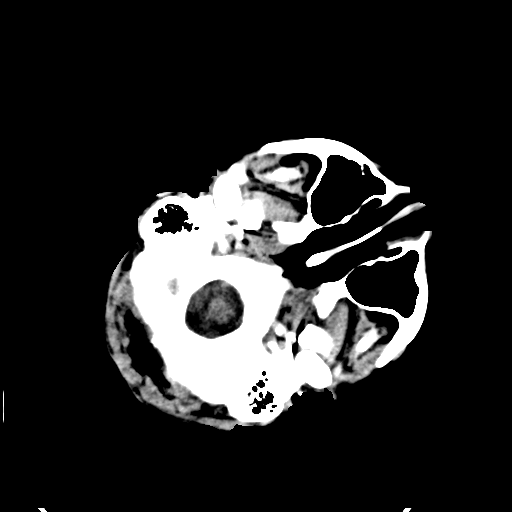
[im 11/37  brain]
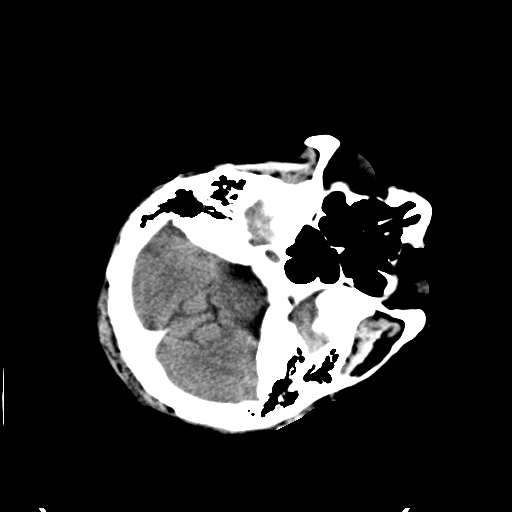
[im 16/37  brain]
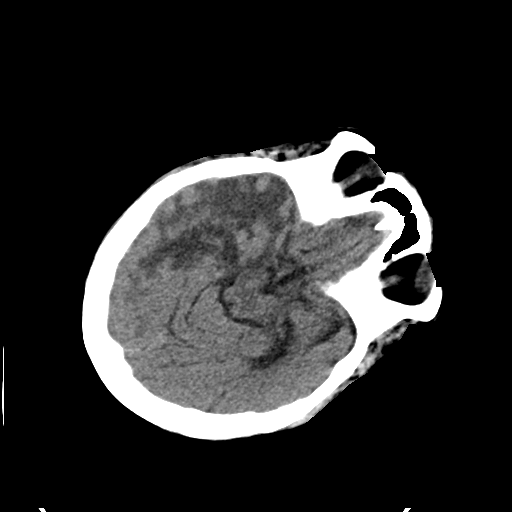
[im 19/37  brain]
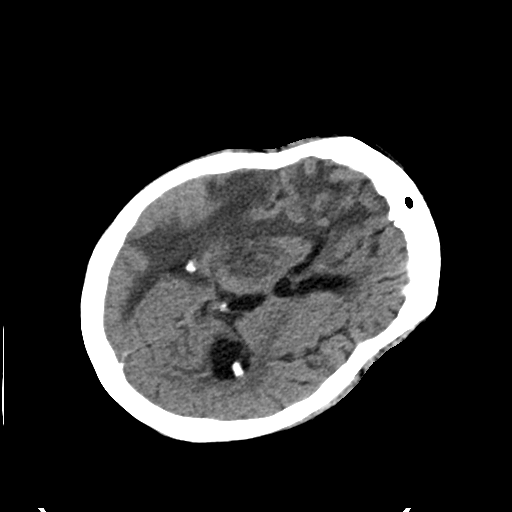
[im 19/37  bone]
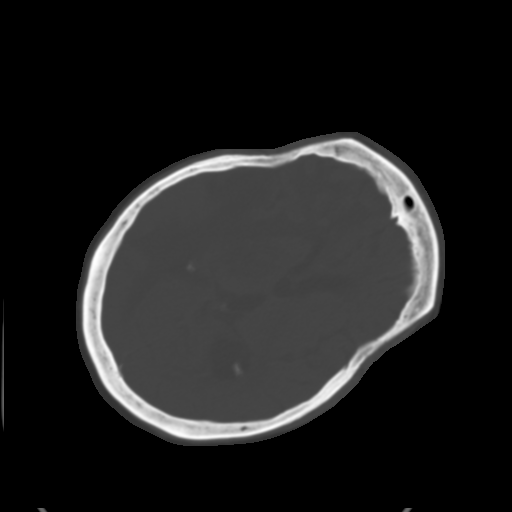
[im 21/37  brain]
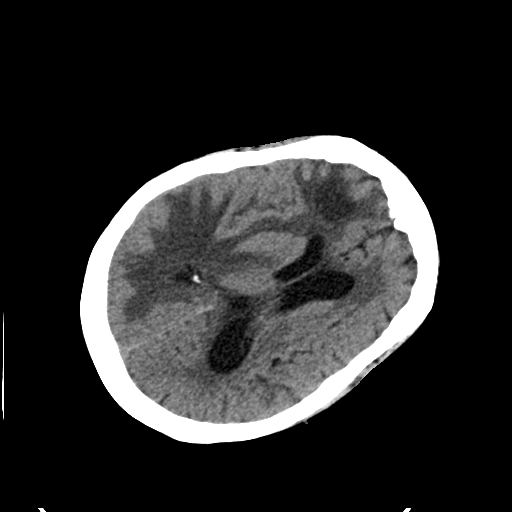
[im 26/37  brain]
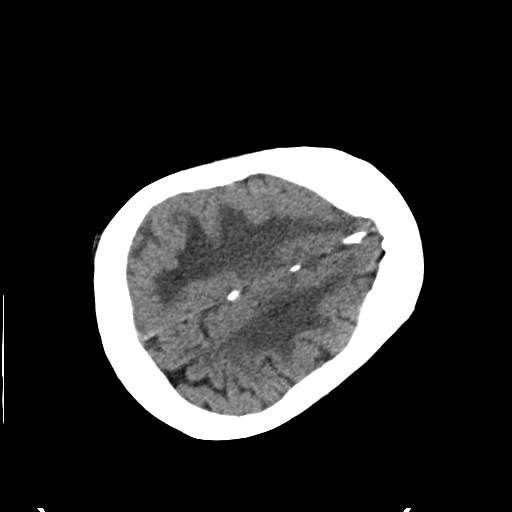
[im 29/37  brain]
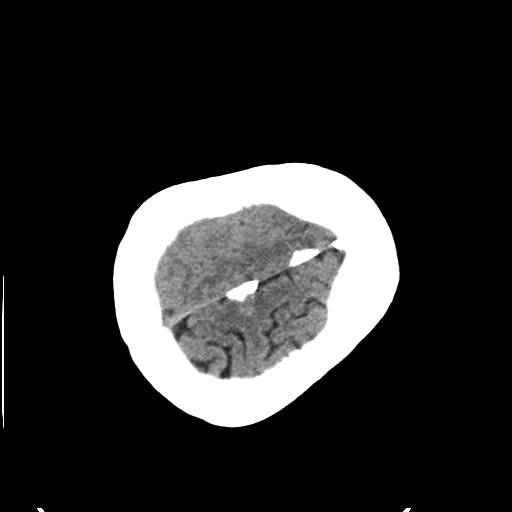
[im 34/37  brain]
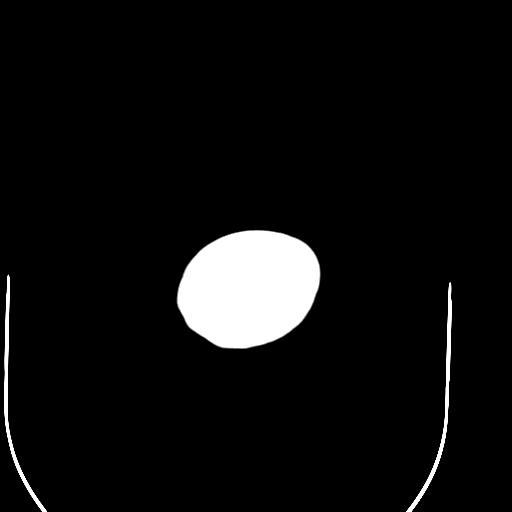
[im 34/37  bone]
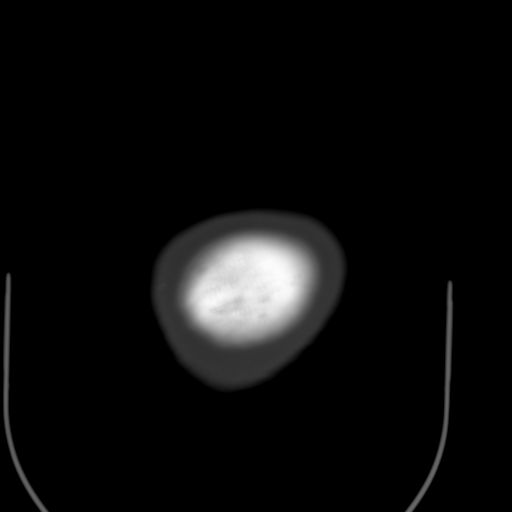

[Series 7: coronal soft tissue · sagittal · 0.32mm/px · 3 of 69 slices shown]
[im 31/69  brain]
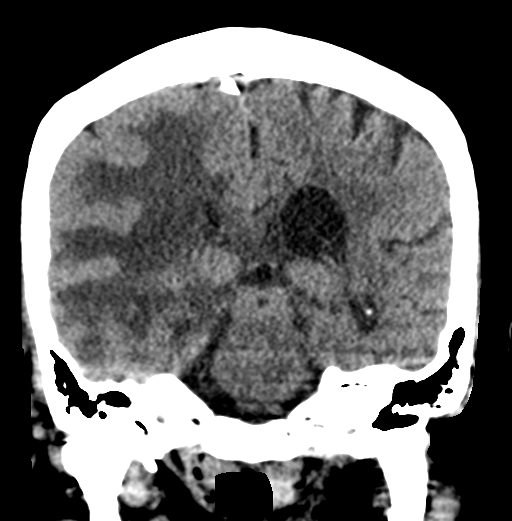
[im 35/69  brain]
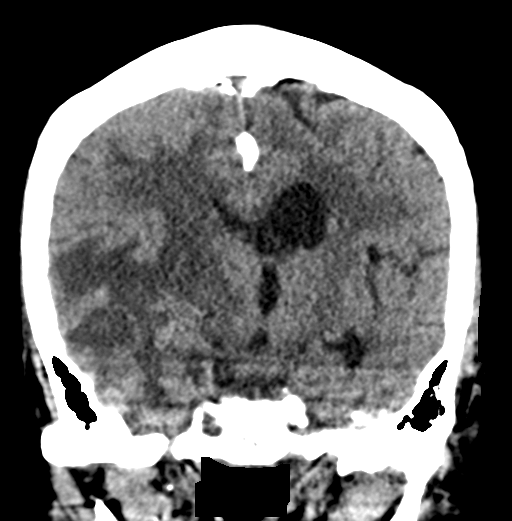
[im 38/69  brain]
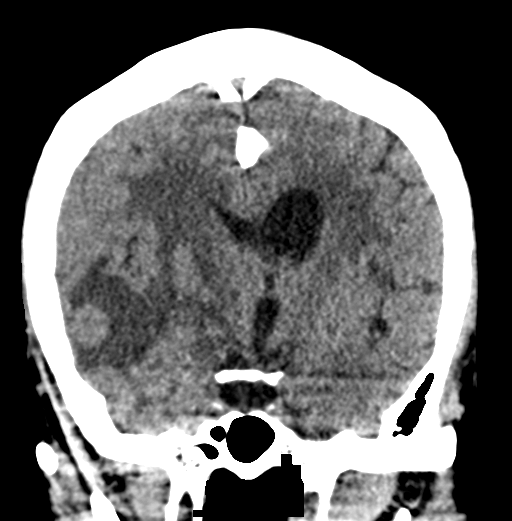

[Series 8: sagittal soft tissue · coronal · 0.32mm/px · 3 of 55 slices shown]
[im 19/55  brain]
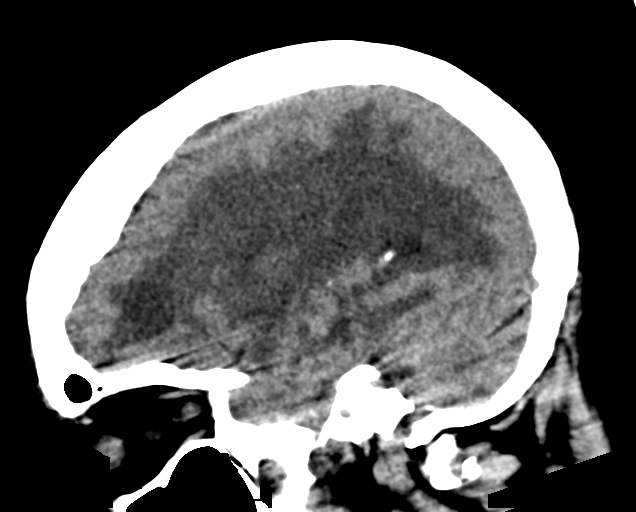
[im 25/55  brain]
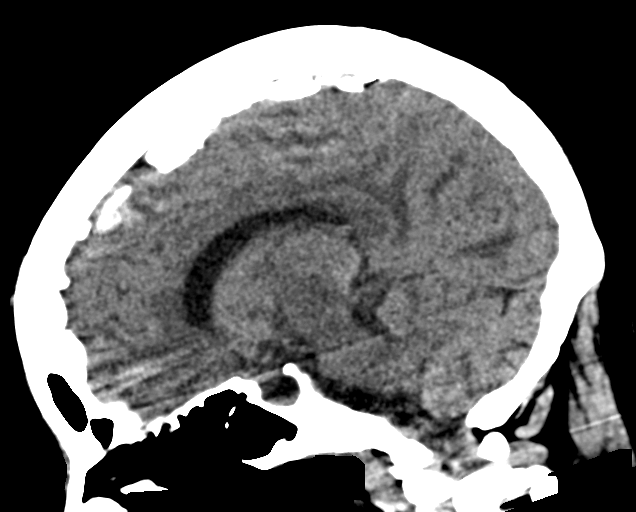
[im 31/55  brain]
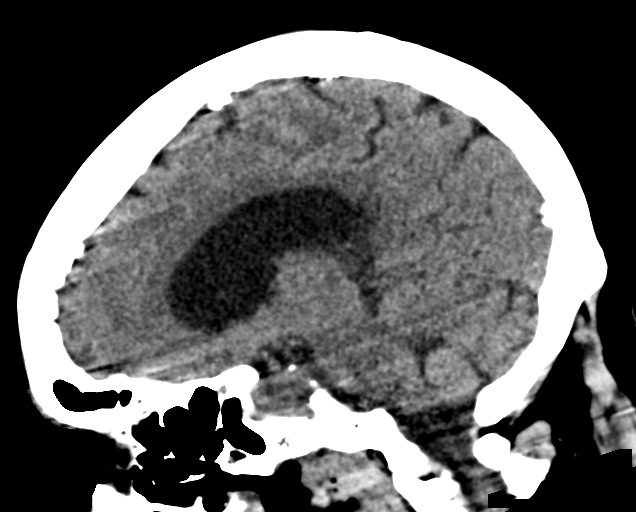

[15 of 47 positions shown; findings below may reference images not displayed]

FINDINGS: The study is mildly motion degraded.

Brain: No acute cortically based infarct, acute intracranial
hemorrhage, or extra-axial fluid collection is identified. Known
metastases are again seen in the right temporal and right frontal
lobes with extensive vasogenic edema in the right cerebral
hemisphere, unchanged from today's earlier CT. Associated mass
effect including partial effacement of the right lateral ventricle
and 4 mm of leftward midline shift is unchanged. Hypodensities in
the left cerebral hemispheric white matter are unchanged and may
reflect the sequelae of radiation therapy.

Vascular: No hyperdense vessel.

Skull: No fracture or suspicious osseous lesion.

Sinuses/Orbits: Trace left mastoid effusion. Clear paranasal
sinuses. Unremarkable orbits.

Other: None.

ASPECTS ([place] Stroke Program Early CT Score)

Not scored with this history.
IMPRESSION: 1. Known right cerebral metastases with extensive vasogenic edema,
unchanged from today's earlier CT.
2. No evidence of acute intracranial abnormality.

These results were communicated to Dr. Johny Darko Amalas at [DATE] on
11/16/2020 by text page via the [employer] messaging system.

## 2021-04-01 IMAGING — DX DG CHEST 1V PORT
1 series · 1 of 1 positions shown · non-contrast
Comparison: Single-view of the chest 08/16/2020.

CLINICAL DATA: Weakness today.  History of breast cancer.

EXAM:
PORTABLE CHEST 1 VIEW

[chest ap]
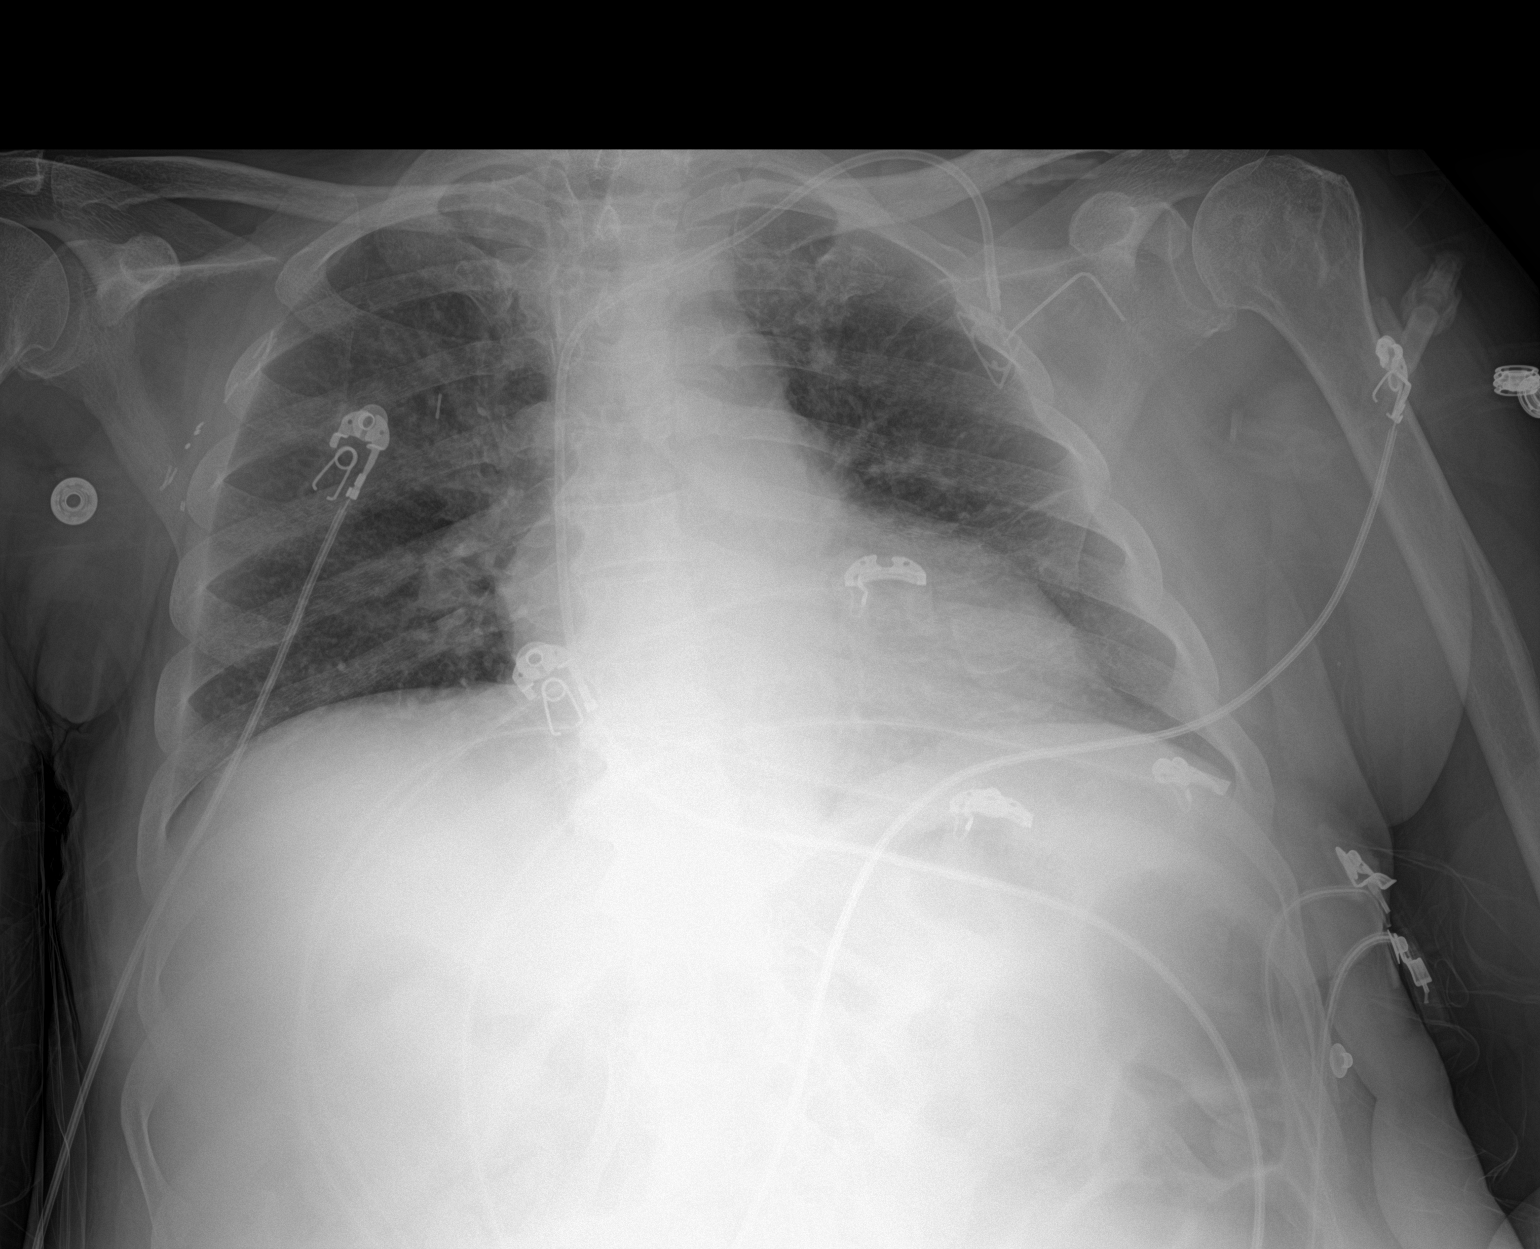

[1 of 1 positions shown; findings below may reference images not displayed]

FINDINGS: The lungs are clear. Heart size is normal. No pneumothorax or
pleural effusion. Port-A-Cath and surgical clips in the right axilla
are unchanged. No acute bony abnormality.
IMPRESSION: No acute disease.

## 2021-04-01 IMAGING — CT CT HEAD W/O CM
3 series · 15 of 47 positions shown, 18 images · non-contrast
Comparison: 11/13/2020 and prior.

CLINICAL DATA: Dizziness, non-specific Mental status change,
unknown cause weakness

EXAM:
CT HEAD WITHOUT CONTRAST
TECHNIQUE: Contiguous axial images were obtained from the base of the skull
through the vertex without intravenous contrast.

[Series 2: head wo · axial · 0.48mm/px · z∈[-277,-132]mm · 9 of 35 slices shown, 12 images]
[im 3/35  brain]
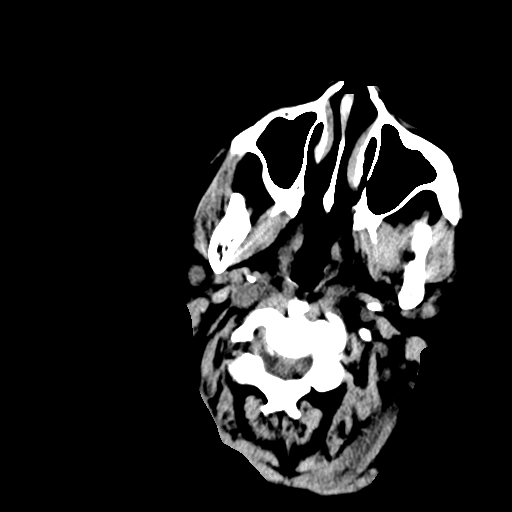
[im 3/35  bone]
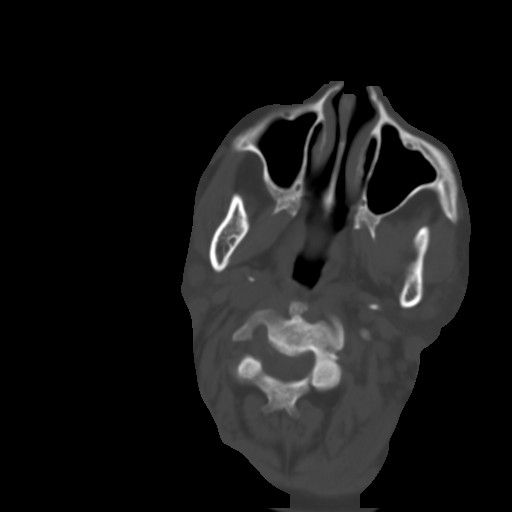
[im 6/35  brain]
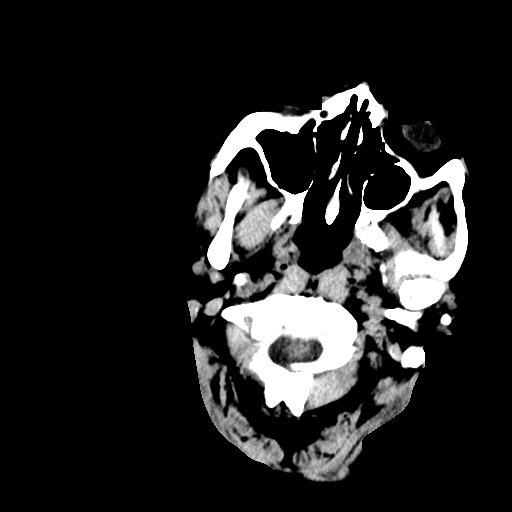
[im 10/35  brain]
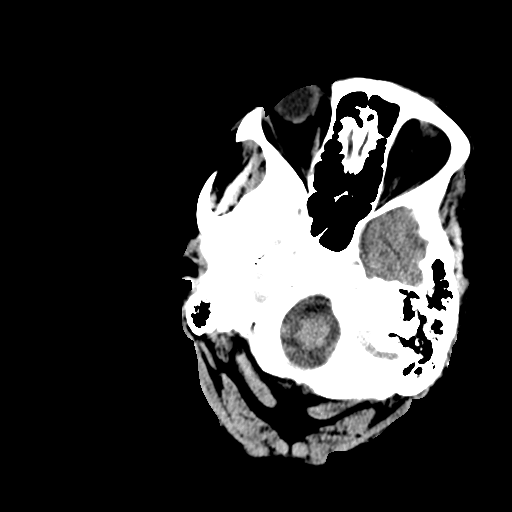
[im 13/35  brain]
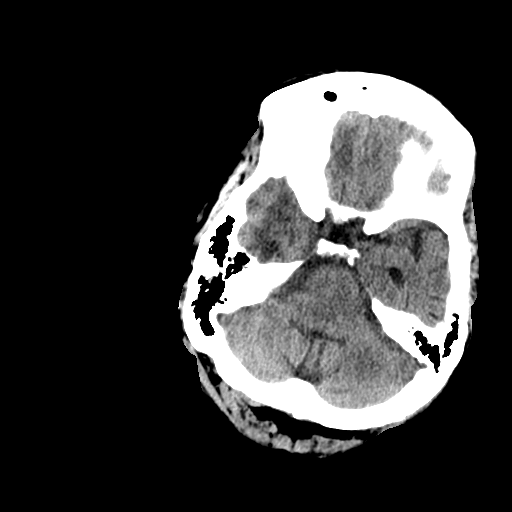
[im 18/35  brain]
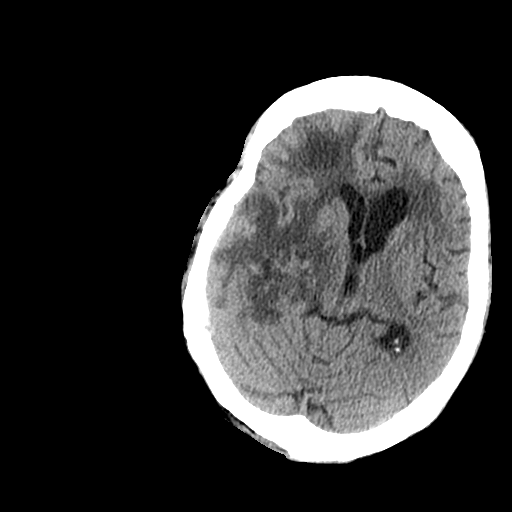
[im 18/35  bone]
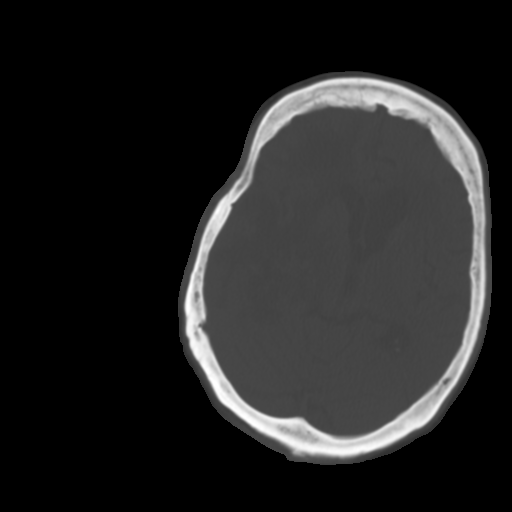
[im 22/35  brain]
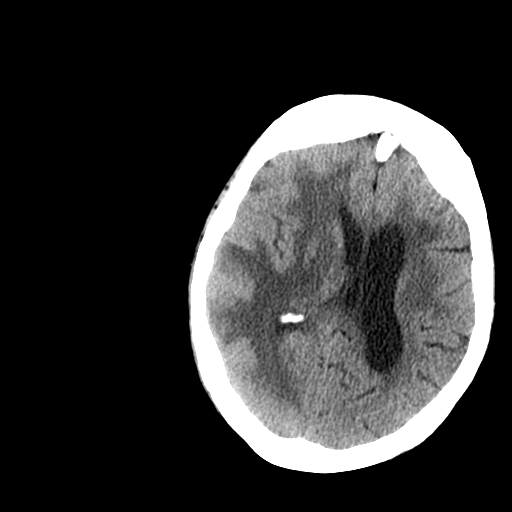
[im 25/35  brain]
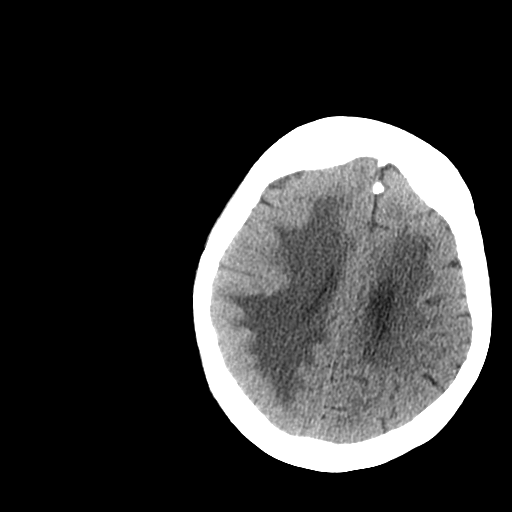
[im 29/35  brain]
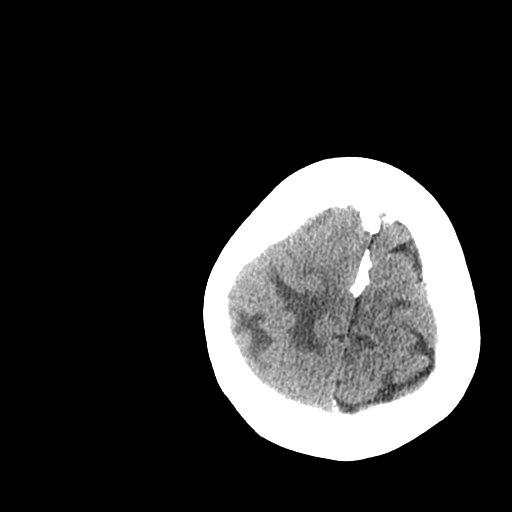
[im 32/35  brain]
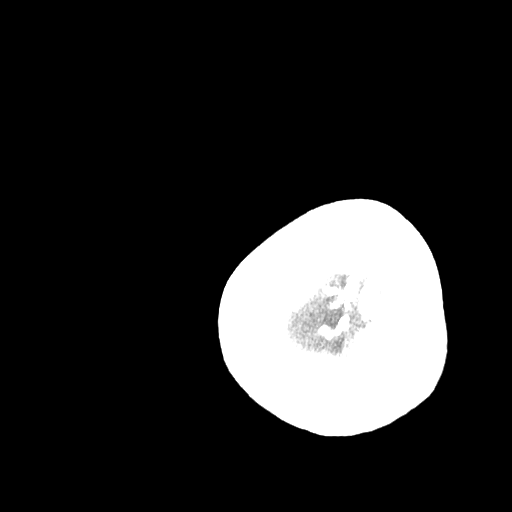
[im 32/35  bone]
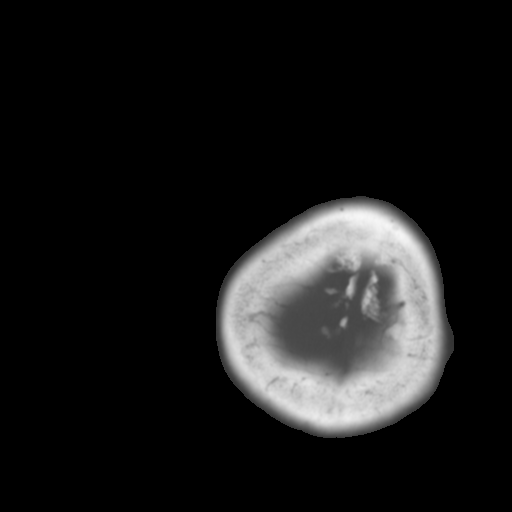

[Series 4: coronal soft tissue · coronal · 0.35mm/px · 3 of 73 slices shown]
[im 25/73  brain]
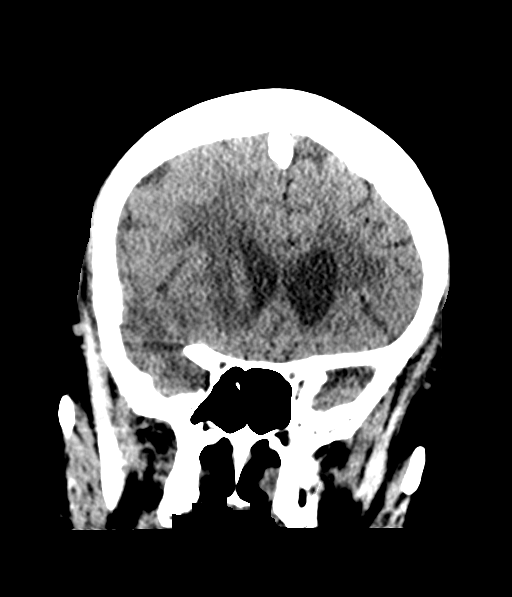
[im 33/73  brain]
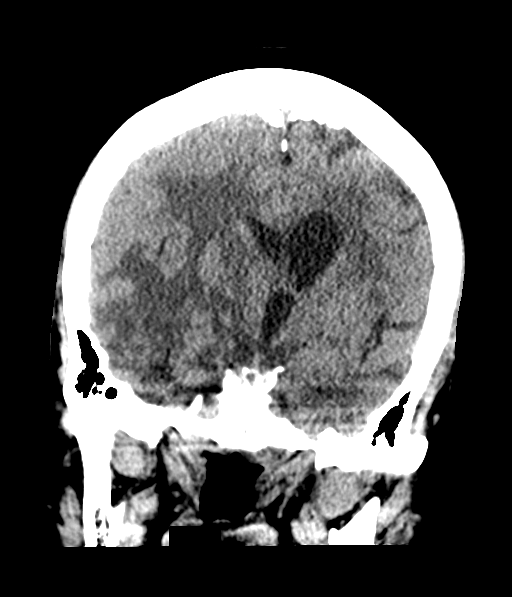
[im 41/73  brain]
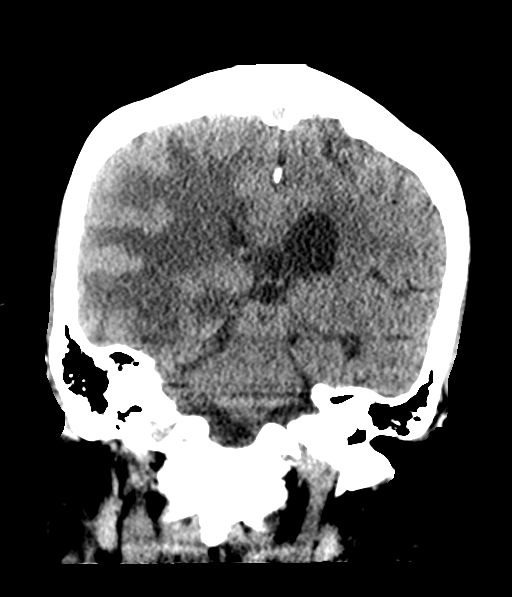

[Series 5: sagittal soft tissue · sagittal · 0.39mm/px · 3 of 56 slices shown]
[im 19/56  brain]
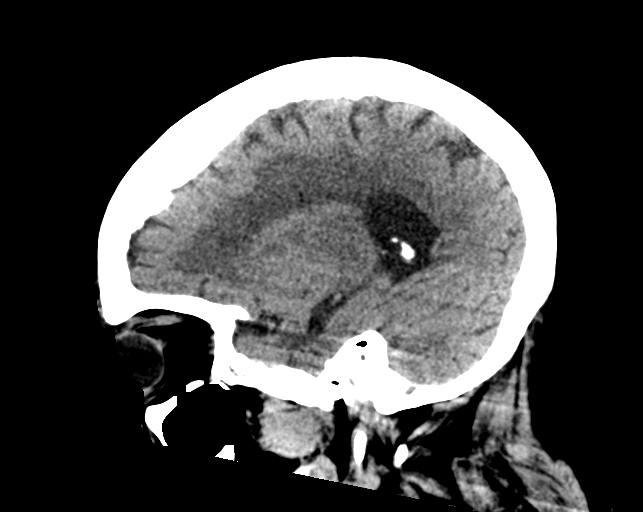
[im 28/56  brain]
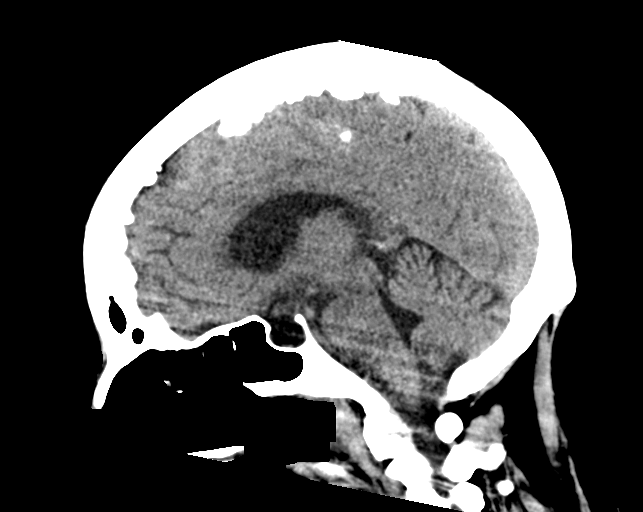
[im 37/56  brain]
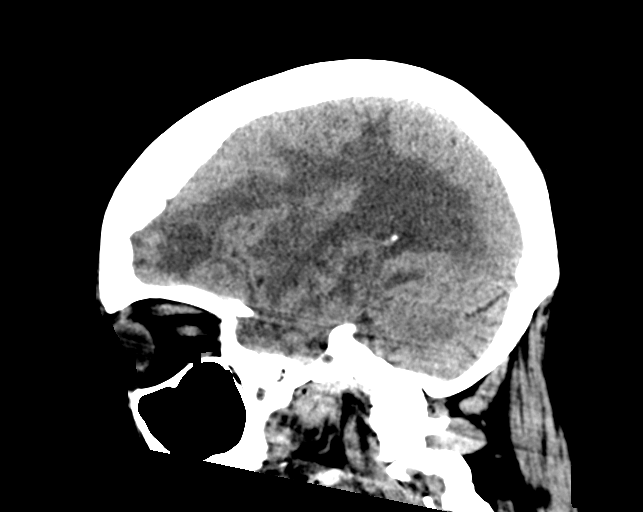

[15 of 47 positions shown; findings below may reference images not displayed]

FINDINGS: Brain: Heterogenous right frontal and right temporal masses are
better demonstrated on recent MRI. Redemonstration of right cerebral
edema. Confluent left cerebral hypodense foci involving the
supratentorial white matter. Leftward midline shift of 5 mm,
unchanged. No ventriculomegaly or extra-axial fluid collection.

Vascular: No hyperdense vessel or unexpected calcification.

Skull: Negative for fracture or focal lesion.

Sinuses/Orbits: Normal orbits. Clear paranasal sinuses. No mastoid
effusion.

Other: None.
IMPRESSION: Grossly unchanged appearance of hemorrhagic right cerebral
metastases, perilesional edema and 5 mm leftward midline shift.

No acute intracranial process.

## 2021-04-03 IMAGING — MR MR TOTAL SPINE METS SCREENING
5 series · 36 of 48 positions shown · non-contrast
Comparison: MRI thoracic spine 02/11/2020

CLINICAL DATA: Metastatic breast cancer. Intracranial metastasis.
Rule out spinal metastasis.

EXAM:
MRI TOTAL SPINE WITHOUT   CONTRAST
TECHNIQUE: Multisequence MR imaging of the spine from the cervical spine to the
sacrum was performed without IV contrast administration for
evaluation of spinal metastatic disease.

[Series 4: STIR · sagittal · 3.0mm · 1.76mm/px · 9 of 16 slices shown (1 of 3)]
[im 1/16]
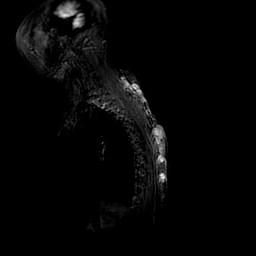
[im 2/16]
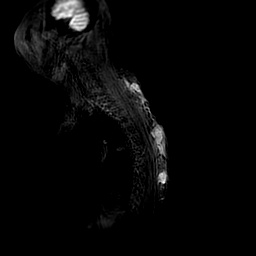
[im 4/16]
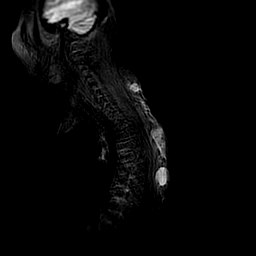
[im 6/16]
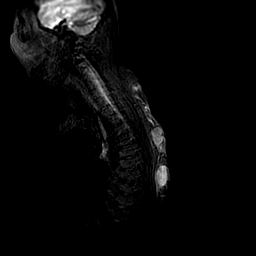
[im 8/16]
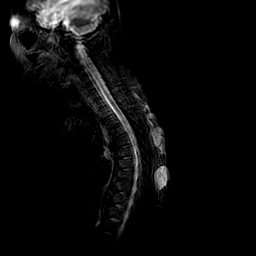
[im 10/16]
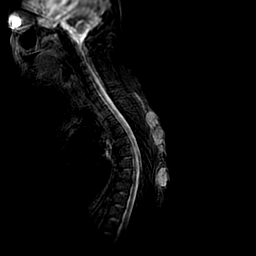
[im 12/16]
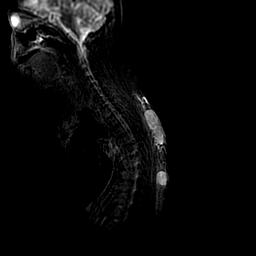
[im 14/16]
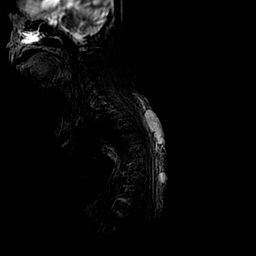
[im 16/16]
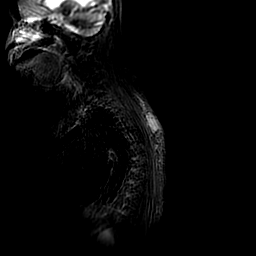

[Series 5: FLAIR · sagittal · 3.0mm · 0.88mm/px · 9 of 16 slices shown (1 of 2)]
[im 1/16]
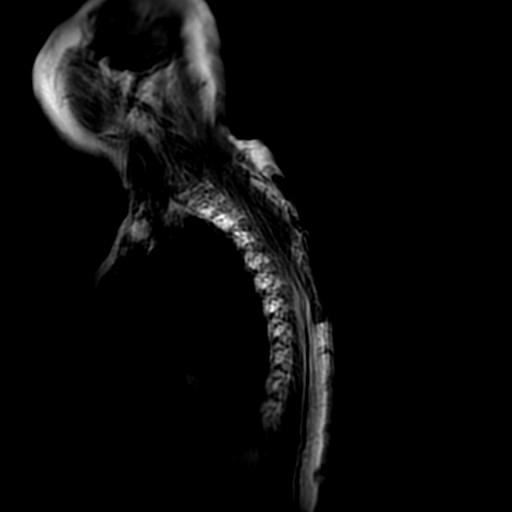
[im 2/16]
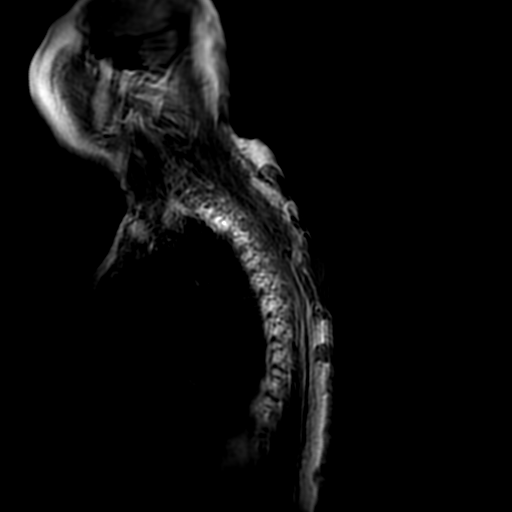
[im 4/16]
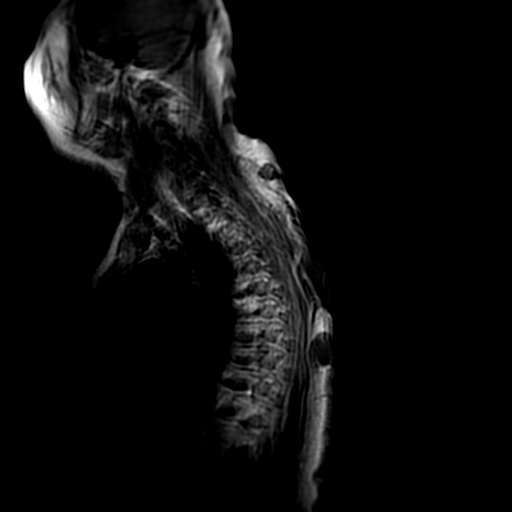
[im 6/16]
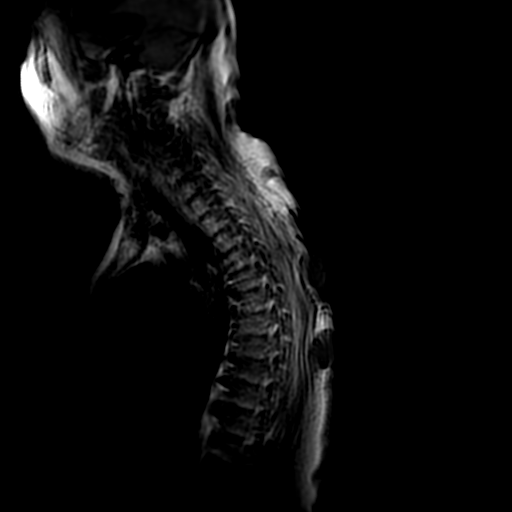
[im 8/16]
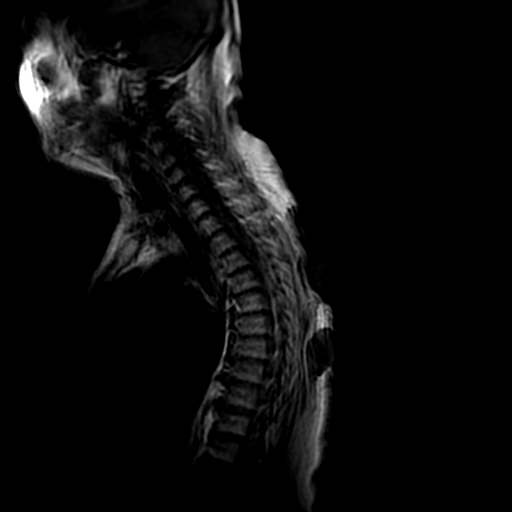
[im 10/16]
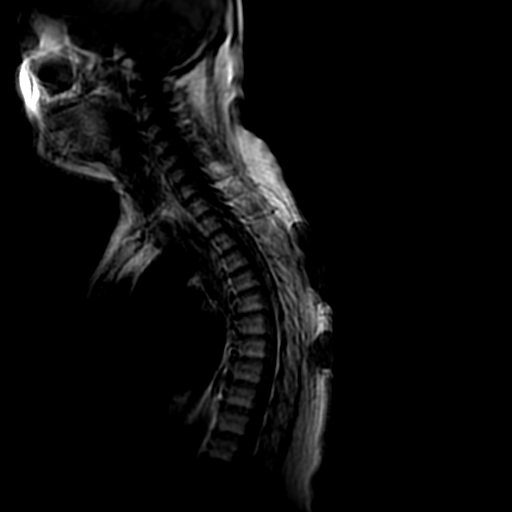
[im 12/16]
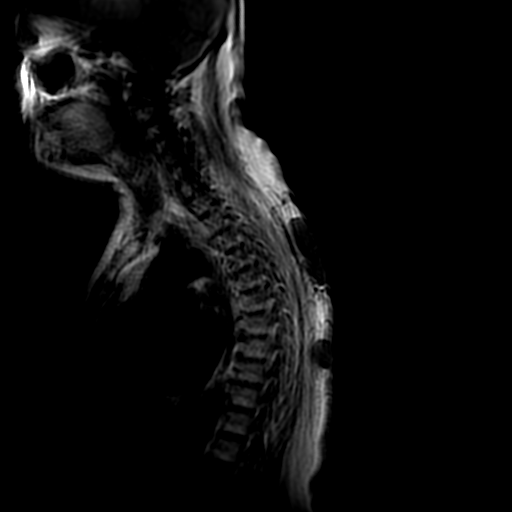
[im 14/16]
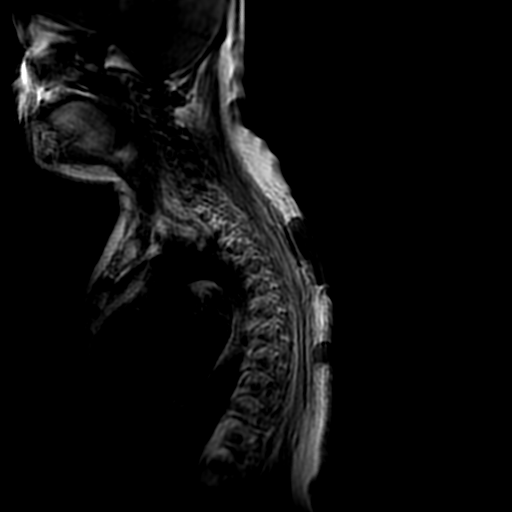
[im 16/16]
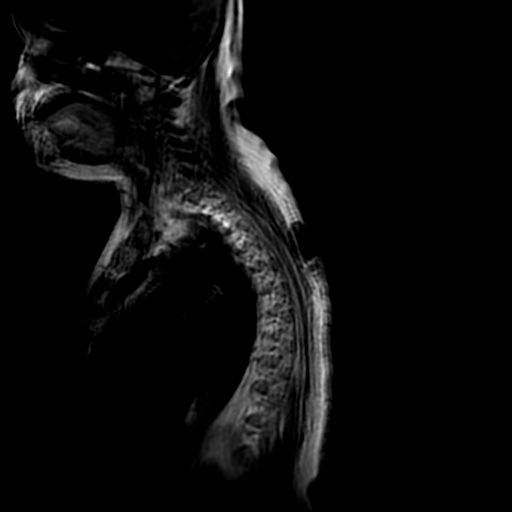

[Series 9: FLAIR · sagittal · 3.0mm · 0.68mm/px · 2 of 16 slices shown (2 of 2)]
[im 1/16]
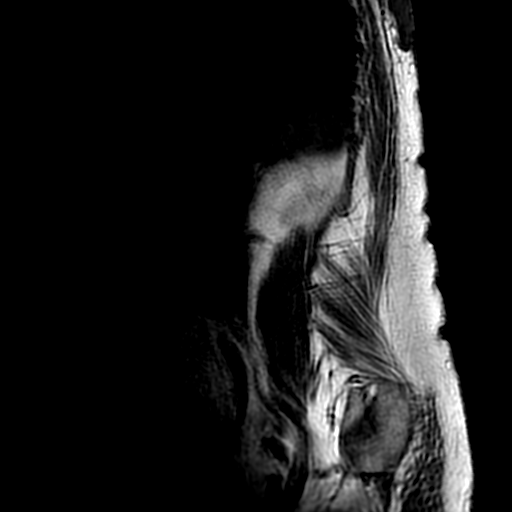
[im 2/16]
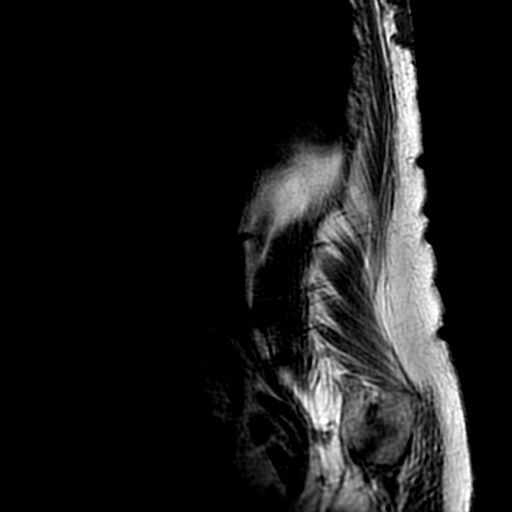

[Series 10: STIR · sagittal · 3.0mm · 1.37mm/px · 8 of 16 slices shown (2 of 3)]
[im 1/16]
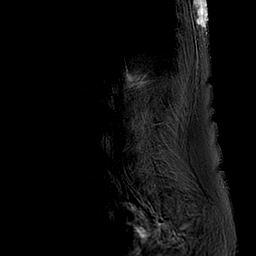
[im 2/16]
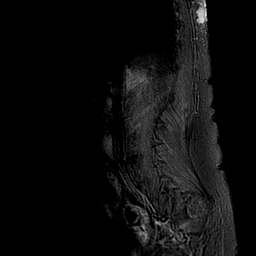
[im 6/16]
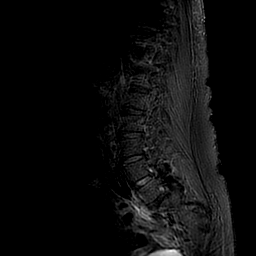
[im 7/16]
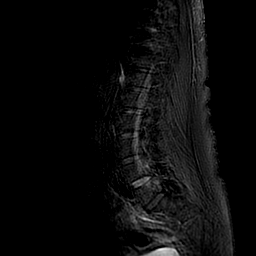
[im 9/16]
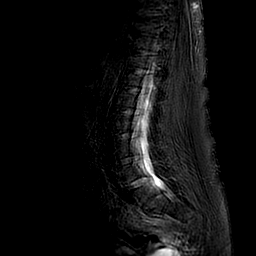
[im 11/16]
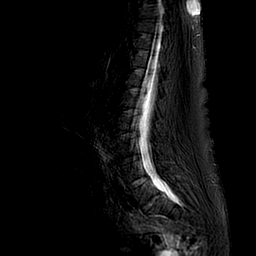
[im 14/16]
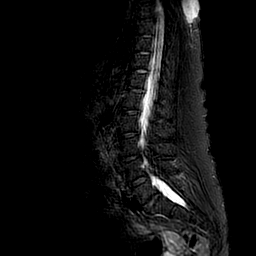
[im 16/16]
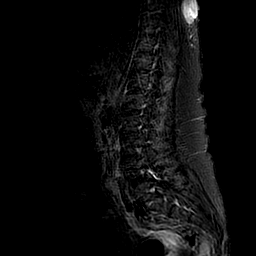

[Series 11: STIR · sagittal · 3.0mm · 1.37mm/px · 8 of 16 slices shown (3 of 3)]
[im 1/16]
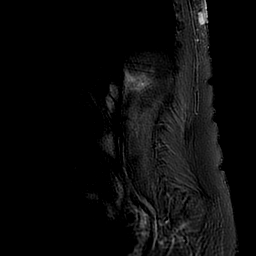
[im 2/16]
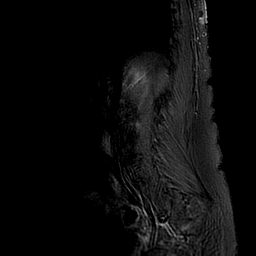
[im 6/16]
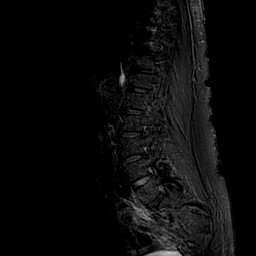
[im 7/16]
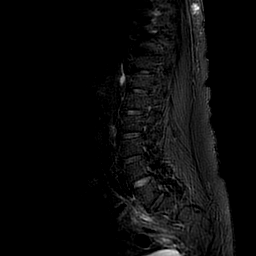
[im 9/16]
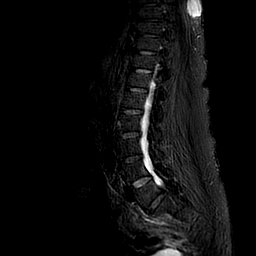
[im 11/16]
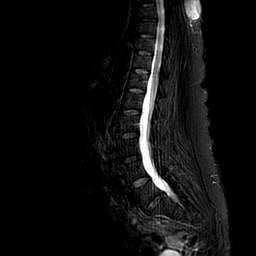
[im 14/16]
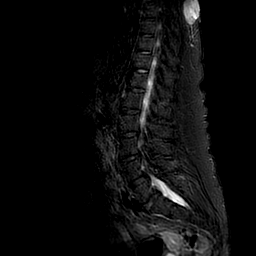
[im 16/16]
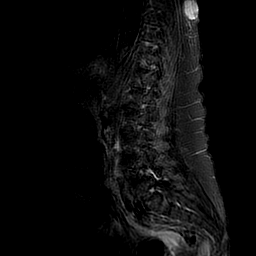

[36 of 48 positions shown; findings below may reference images not displayed]

FINDINGS: Limited study. The patient was sedated but not able to hold still.
There is extensive motion on the studies which are nearly
nondiagnostic.

No definite spinal metastasis.  No spinal fracture or mass lesion.

Multiple cystic masses in the subcutaneous tissues of the thoracic
back. These have progressed since 2626. These showed hypermetabolic
activity on prior PET 08/20/2019 therefore suspicious for metastatic
disease with progression.

No intravenous contrast administered. The study was terminated early
due to extensive motion
IMPRESSION: Image quality degraded by extensive motion. The study was terminated
early and no intravenous contrast given.

No definite spinal metastasis

Enlarging cystic masses in the subcutaneous tissues of the upper
back likely due to progressive metastatic disease.
